# Patient Record
Sex: Female | Born: 1955
Health system: Southern US, Community
[De-identification: ages and names within clinical notes are randomized; demographics above are authoritative.]

## PROBLEM LIST (undated history)

## (undated) DIAGNOSIS — G4730 Sleep apnea, unspecified: Secondary | ICD-10-CM

## (undated) DIAGNOSIS — E049 Nontoxic goiter, unspecified: Secondary | ICD-10-CM

## (undated) DIAGNOSIS — K219 Gastro-esophageal reflux disease without esophagitis: Secondary | ICD-10-CM

## (undated) DIAGNOSIS — I2510 Atherosclerotic heart disease of native coronary artery without angina pectoris: Secondary | ICD-10-CM

## (undated) DIAGNOSIS — J449 Chronic obstructive pulmonary disease, unspecified: Secondary | ICD-10-CM

## (undated) DIAGNOSIS — IMO0001 Reserved for inherently not codable concepts without codable children: Secondary | ICD-10-CM

## (undated) DIAGNOSIS — Z87442 Personal history of urinary calculi: Secondary | ICD-10-CM

## (undated) DIAGNOSIS — R011 Cardiac murmur, unspecified: Secondary | ICD-10-CM

## (undated) DIAGNOSIS — K635 Polyp of colon: Secondary | ICD-10-CM

## (undated) DIAGNOSIS — R519 Headache, unspecified: Secondary | ICD-10-CM

## (undated) DIAGNOSIS — I5022 Chronic systolic (congestive) heart failure: Secondary | ICD-10-CM

## (undated) DIAGNOSIS — M1990 Unspecified osteoarthritis, unspecified site: Secondary | ICD-10-CM

## (undated) DIAGNOSIS — R569 Unspecified convulsions: Secondary | ICD-10-CM

## (undated) DIAGNOSIS — Z8719 Personal history of other diseases of the digestive system: Secondary | ICD-10-CM

## (undated) DIAGNOSIS — Z720 Tobacco use: Secondary | ICD-10-CM

## (undated) DIAGNOSIS — C3490 Malignant neoplasm of unspecified part of unspecified bronchus or lung: Secondary | ICD-10-CM

## (undated) DIAGNOSIS — D649 Anemia, unspecified: Secondary | ICD-10-CM

## (undated) DIAGNOSIS — F32A Depression, unspecified: Secondary | ICD-10-CM

## (undated) DIAGNOSIS — I639 Cerebral infarction, unspecified: Secondary | ICD-10-CM

## (undated) DIAGNOSIS — Z923 Personal history of irradiation: Secondary | ICD-10-CM

## (undated) DIAGNOSIS — J189 Pneumonia, unspecified organism: Secondary | ICD-10-CM

## (undated) DIAGNOSIS — I6529 Occlusion and stenosis of unspecified carotid artery: Secondary | ICD-10-CM

## (undated) DIAGNOSIS — E785 Hyperlipidemia, unspecified: Secondary | ICD-10-CM

## (undated) DIAGNOSIS — R51 Headache: Secondary | ICD-10-CM

## (undated) DIAGNOSIS — K760 Fatty (change of) liver, not elsewhere classified: Secondary | ICD-10-CM

## (undated) DIAGNOSIS — Z9889 Other specified postprocedural states: Secondary | ICD-10-CM

## (undated) DIAGNOSIS — I10 Essential (primary) hypertension: Secondary | ICD-10-CM

## (undated) DIAGNOSIS — I219 Acute myocardial infarction, unspecified: Secondary | ICD-10-CM

## (undated) DIAGNOSIS — M797 Fibromyalgia: Secondary | ICD-10-CM

## (undated) HISTORY — DX: Occlusion and stenosis of unspecified carotid artery: I65.29

## (undated) HISTORY — PX: COLON RESECTION: SHX5231

## (undated) HISTORY — DX: Cerebral infarction, unspecified: I63.9

## (undated) HISTORY — DX: Other specified postprocedural states: Z98.890

## (undated) HISTORY — DX: Atherosclerotic heart disease of native coronary artery without angina pectoris: I25.10

## (undated) HISTORY — PX: CHOLECYSTECTOMY: SHX55

## (undated) HISTORY — DX: Cardiac murmur, unspecified: R01.1

## (undated) HISTORY — DX: Chronic obstructive pulmonary disease, unspecified: J44.9

## (undated) HISTORY — PX: APPENDECTOMY: SHX54

## (undated) HISTORY — DX: Tobacco use: Z72.0

## (undated) HISTORY — DX: Polyp of colon: K63.5

## (undated) HISTORY — DX: Personal history of irradiation: Z92.3

## (undated) HISTORY — DX: Nontoxic goiter, unspecified: E04.9

## (undated) HISTORY — DX: Malignant neoplasm of unspecified part of unspecified bronchus or lung: C34.90

## (undated) HISTORY — DX: Hyperlipidemia, unspecified: E78.5

## (undated) HISTORY — DX: Personal history of other diseases of the digestive system: Z87.19

## (undated) HISTORY — DX: Essential (primary) hypertension: I10

## (undated) MED FILL — Dexamethasone Sodium Phosphate Inj 100 MG/10ML: INTRAMUSCULAR | Qty: 1 | Status: AC

## (undated) MED FILL — Fosaprepitant Dimeglumine For IV Infusion 150 MG (Base Eq): INTRAVENOUS | Qty: 5 | Status: AC

---

## 1988-10-01 HISTORY — PX: CERVICAL FUSION: SHX112

## 1991-10-02 DIAGNOSIS — I639 Cerebral infarction, unspecified: Secondary | ICD-10-CM

## 1991-10-02 HISTORY — DX: Cerebral infarction, unspecified: I63.9

## 1997-12-02 ENCOUNTER — Ambulatory Visit (HOSPITAL_COMMUNITY): Admission: RE | Admit: 1997-12-02 | Discharge: 1997-12-02 | Payer: Self-pay | Admitting: *Deleted

## 1998-01-06 ENCOUNTER — Encounter: Admission: RE | Admit: 1998-01-06 | Discharge: 1998-01-06 | Payer: Self-pay | Admitting: Family Medicine

## 1998-01-24 ENCOUNTER — Ambulatory Visit (HOSPITAL_COMMUNITY): Admission: RE | Admit: 1998-01-24 | Discharge: 1998-01-24 | Payer: Self-pay

## 1998-01-28 ENCOUNTER — Encounter: Admission: RE | Admit: 1998-01-28 | Discharge: 1998-01-28 | Payer: Self-pay | Admitting: Family Medicine

## 1998-01-31 ENCOUNTER — Encounter: Admission: RE | Admit: 1998-01-31 | Discharge: 1998-01-31 | Payer: Self-pay | Admitting: Family Medicine

## 1998-04-01 ENCOUNTER — Encounter: Admission: RE | Admit: 1998-04-01 | Discharge: 1998-04-01 | Payer: Self-pay | Admitting: Family Medicine

## 1998-09-05 ENCOUNTER — Encounter: Admission: RE | Admit: 1998-09-05 | Discharge: 1998-09-05 | Payer: Self-pay | Admitting: Sports Medicine

## 1998-09-28 ENCOUNTER — Encounter: Admission: RE | Admit: 1998-09-28 | Discharge: 1998-09-28 | Payer: Self-pay | Admitting: Sports Medicine

## 1998-10-04 ENCOUNTER — Encounter: Admission: RE | Admit: 1998-10-04 | Discharge: 1998-10-04 | Payer: Self-pay | Admitting: Family Medicine

## 1998-10-14 ENCOUNTER — Encounter: Admission: RE | Admit: 1998-10-14 | Discharge: 1998-10-14 | Payer: Self-pay | Admitting: Family Medicine

## 1998-11-04 ENCOUNTER — Encounter: Admission: RE | Admit: 1998-11-04 | Discharge: 1998-11-04 | Payer: Self-pay | Admitting: Family Medicine

## 2000-07-17 ENCOUNTER — Other Ambulatory Visit: Admission: RE | Admit: 2000-07-17 | Discharge: 2000-07-17 | Payer: Self-pay | Admitting: Family Medicine

## 2000-07-24 ENCOUNTER — Encounter: Admission: RE | Admit: 2000-07-24 | Discharge: 2000-07-24 | Payer: Self-pay | Admitting: Family Medicine

## 2000-07-24 ENCOUNTER — Encounter: Payer: Self-pay | Admitting: Family Medicine

## 2000-07-31 ENCOUNTER — Encounter: Admission: RE | Admit: 2000-07-31 | Discharge: 2000-10-29 | Payer: Self-pay | Admitting: Family Medicine

## 2002-09-01 ENCOUNTER — Encounter: Admission: RE | Admit: 2002-09-01 | Discharge: 2002-09-01 | Payer: Self-pay | Admitting: Family Medicine

## 2002-09-01 ENCOUNTER — Encounter: Payer: Self-pay | Admitting: Family Medicine

## 2002-10-12 ENCOUNTER — Encounter: Payer: Self-pay | Admitting: Family Medicine

## 2002-10-12 ENCOUNTER — Encounter: Admission: RE | Admit: 2002-10-12 | Discharge: 2002-10-12 | Payer: Self-pay | Admitting: Family Medicine

## 2002-11-09 ENCOUNTER — Other Ambulatory Visit: Admission: RE | Admit: 2002-11-09 | Discharge: 2002-11-09 | Payer: Self-pay | Admitting: Family Medicine

## 2004-06-06 ENCOUNTER — Emergency Department (HOSPITAL_COMMUNITY): Admission: EM | Admit: 2004-06-06 | Discharge: 2004-06-06 | Payer: Self-pay | Admitting: Family Medicine

## 2004-08-29 ENCOUNTER — Encounter: Admission: RE | Admit: 2004-08-29 | Discharge: 2004-08-29 | Payer: Self-pay | Admitting: Family Medicine

## 2004-10-12 ENCOUNTER — Ambulatory Visit: Payer: Self-pay | Admitting: Family Medicine

## 2004-10-18 ENCOUNTER — Ambulatory Visit: Payer: Self-pay | Admitting: Family Medicine

## 2004-10-20 ENCOUNTER — Encounter: Admission: RE | Admit: 2004-10-20 | Discharge: 2004-11-17 | Payer: Self-pay | Admitting: Chiropractic Medicine

## 2004-11-01 ENCOUNTER — Ambulatory Visit: Payer: Self-pay | Admitting: Family Medicine

## 2004-11-06 ENCOUNTER — Other Ambulatory Visit: Admission: RE | Admit: 2004-11-06 | Discharge: 2004-11-06 | Payer: Self-pay | Admitting: Obstetrics and Gynecology

## 2004-11-07 ENCOUNTER — Ambulatory Visit (HOSPITAL_COMMUNITY): Admission: RE | Admit: 2004-11-07 | Discharge: 2004-11-07 | Payer: Self-pay | Admitting: Obstetrics and Gynecology

## 2004-12-04 ENCOUNTER — Ambulatory Visit: Payer: Self-pay | Admitting: Family Medicine

## 2005-01-08 ENCOUNTER — Ambulatory Visit: Payer: Self-pay | Admitting: Family Medicine

## 2005-02-15 ENCOUNTER — Ambulatory Visit: Payer: Self-pay | Admitting: Family Medicine

## 2005-03-19 ENCOUNTER — Ambulatory Visit: Payer: Self-pay | Admitting: Family Medicine

## 2005-05-30 ENCOUNTER — Encounter: Admission: RE | Admit: 2005-05-30 | Discharge: 2005-05-30 | Payer: Self-pay | Admitting: Sports Medicine

## 2005-06-19 ENCOUNTER — Ambulatory Visit: Payer: Self-pay | Admitting: Family Medicine

## 2005-07-10 ENCOUNTER — Ambulatory Visit: Payer: Self-pay | Admitting: Family Medicine

## 2005-08-14 ENCOUNTER — Ambulatory Visit: Payer: Self-pay | Admitting: Family Medicine

## 2005-08-17 ENCOUNTER — Ambulatory Visit: Payer: Self-pay | Admitting: Family Medicine

## 2005-09-14 ENCOUNTER — Encounter: Admission: RE | Admit: 2005-09-14 | Discharge: 2005-09-14 | Payer: Self-pay | Admitting: Sports Medicine

## 2005-09-19 ENCOUNTER — Ambulatory Visit: Payer: Self-pay | Admitting: Family Medicine

## 2005-09-28 ENCOUNTER — Encounter: Admission: RE | Admit: 2005-09-28 | Discharge: 2005-09-28 | Payer: Self-pay | Admitting: Sports Medicine

## 2005-10-10 ENCOUNTER — Encounter: Admission: RE | Admit: 2005-10-10 | Discharge: 2005-10-10 | Payer: Self-pay | Admitting: Family Medicine

## 2005-11-01 ENCOUNTER — Encounter: Payer: Self-pay | Admitting: Family Medicine

## 2005-11-01 LAB — CONVERTED CEMR LAB

## 2005-11-12 ENCOUNTER — Other Ambulatory Visit: Admission: RE | Admit: 2005-11-12 | Discharge: 2005-11-12 | Payer: Self-pay | Admitting: Obstetrics and Gynecology

## 2005-11-20 ENCOUNTER — Ambulatory Visit: Payer: Self-pay | Admitting: Family Medicine

## 2005-11-26 ENCOUNTER — Encounter: Admission: RE | Admit: 2005-11-26 | Discharge: 2005-11-26 | Payer: Self-pay | Admitting: Obstetrics and Gynecology

## 2005-11-27 ENCOUNTER — Ambulatory Visit: Payer: Self-pay | Admitting: Family Medicine

## 2005-11-29 ENCOUNTER — Inpatient Hospital Stay (HOSPITAL_COMMUNITY): Admission: EM | Admit: 2005-11-29 | Discharge: 2005-12-02 | Payer: Self-pay | Admitting: Emergency Medicine

## 2005-11-29 HISTORY — PX: CARDIAC CATHETERIZATION: SHX172

## 2005-11-30 ENCOUNTER — Encounter (INDEPENDENT_AMBULATORY_CARE_PROVIDER_SITE_OTHER): Payer: Self-pay | Admitting: *Deleted

## 2005-12-03 ENCOUNTER — Inpatient Hospital Stay (HOSPITAL_COMMUNITY): Admission: EM | Admit: 2005-12-03 | Discharge: 2005-12-11 | Payer: Self-pay | Admitting: Emergency Medicine

## 2005-12-04 HISTORY — PX: CORONARY ARTERY BYPASS GRAFT: SHX141

## 2006-01-15 ENCOUNTER — Ambulatory Visit: Payer: Self-pay | Admitting: Family Medicine

## 2006-02-15 ENCOUNTER — Ambulatory Visit: Payer: Self-pay | Admitting: Family Medicine

## 2006-03-05 ENCOUNTER — Ambulatory Visit: Payer: Self-pay | Admitting: Pulmonary Disease

## 2006-03-24 ENCOUNTER — Ambulatory Visit (HOSPITAL_BASED_OUTPATIENT_CLINIC_OR_DEPARTMENT_OTHER): Admission: RE | Admit: 2006-03-24 | Discharge: 2006-03-24 | Payer: Self-pay | Admitting: Pulmonary Disease

## 2006-04-10 ENCOUNTER — Ambulatory Visit: Payer: Self-pay | Admitting: Pulmonary Disease

## 2006-04-18 ENCOUNTER — Encounter (HOSPITAL_COMMUNITY): Admission: RE | Admit: 2006-04-18 | Discharge: 2006-07-17 | Payer: Self-pay | Admitting: *Deleted

## 2006-04-29 ENCOUNTER — Encounter: Payer: Self-pay | Admitting: Pulmonary Disease

## 2006-04-29 ENCOUNTER — Ambulatory Visit: Payer: Self-pay | Admitting: Pulmonary Disease

## 2006-07-18 ENCOUNTER — Encounter (HOSPITAL_COMMUNITY): Admission: RE | Admit: 2006-07-18 | Discharge: 2006-07-22 | Payer: Self-pay | Admitting: *Deleted

## 2006-08-06 HISTORY — PX: CARDIAC CATHETERIZATION: SHX172

## 2006-08-07 ENCOUNTER — Inpatient Hospital Stay (HOSPITAL_COMMUNITY): Admission: RE | Admit: 2006-08-07 | Discharge: 2006-08-09 | Payer: Self-pay | Admitting: *Deleted

## 2006-08-11 HISTORY — PX: CORONARY STENT PLACEMENT: SHX1402

## 2006-10-10 ENCOUNTER — Ambulatory Visit: Payer: Self-pay | Admitting: Family Medicine

## 2006-10-10 LAB — CONVERTED CEMR LAB
ALT: 52 units/L — ABNORMAL HIGH (ref 0–40)
AST: 42 units/L — ABNORMAL HIGH (ref 0–37)
Albumin: 3.5 g/dL (ref 3.5–5.2)
BUN: 13 mg/dL (ref 6–23)
CO2: 30 meq/L (ref 19–32)
Calcium: 9.5 mg/dL (ref 8.4–10.5)
Chloride: 97 meq/L (ref 96–112)
Chol/HDL Ratio, serum: 4.8
Cholesterol: 169 mg/dL (ref 0–200)
Creatinine, Ser: 0.8 mg/dL (ref 0.4–1.2)
GFR calc non Af Amer: 80 mL/min
Glomerular Filtration Rate, Af Am: 97 mL/min/{1.73_m2}
Glucose, Bld: 251 mg/dL — ABNORMAL HIGH (ref 70–99)
HDL: 35 mg/dL — ABNORMAL LOW (ref >39.0)
Hgb A1c MFr Bld: 9.3 % — ABNORMAL HIGH (ref 4.6–6.0)
LDL Cholesterol: 98 mg/dL (ref 0–99)
Phosphorus Concentration: 3.4 mg/dL (ref 2.3–4.6)
Potassium: 4 meq/L (ref 3.5–5.1)
Sodium: 135 meq/L (ref 135–145)
Triglyceride fasting, serum: 182 mg/dL — ABNORMAL HIGH (ref 0–149)
VLDL: 36 mg/dL (ref 0–40)

## 2006-11-21 ENCOUNTER — Ambulatory Visit: Payer: Self-pay | Admitting: Family Medicine

## 2006-12-12 ENCOUNTER — Ambulatory Visit: Payer: Self-pay | Admitting: Pulmonary Disease

## 2007-01-02 ENCOUNTER — Ambulatory Visit: Payer: Self-pay | Admitting: Family Medicine

## 2007-01-02 LAB — CONVERTED CEMR LAB
ALT: 24 units/L (ref 0–40)
AST: 21 units/L (ref 0–37)
Albumin: 3.4 g/dL — ABNORMAL LOW (ref 3.5–5.2)
Alkaline Phosphatase: 102 units/L (ref 39–117)
Amylase: 39 units/L (ref 27–131)
BUN: 10 mg/dL (ref 6–23)
Basophils Absolute: 0.1 10*3/uL (ref 0.0–0.1)
Basophils Relative: 1.2 % — ABNORMAL HIGH (ref 0.0–1.0)
Bilirubin, Direct: 0.1 mg/dL (ref 0.0–0.3)
CO2: 30 meq/L (ref 19–32)
Calcium: 9.2 mg/dL (ref 8.4–10.5)
Chloride: 106 meq/L (ref 96–112)
Cholesterol: 129 mg/dL (ref 0–200)
Creatinine, Ser: 0.6 mg/dL (ref 0.4–1.2)
Direct LDL: 70.4 mg/dL
Eosinophils Absolute: 0.4 10*3/uL (ref 0.0–0.6)
Eosinophils Relative: 5.6 % — ABNORMAL HIGH (ref 0.0–5.0)
GFR calc Af Amer: 135 mL/min
GFR calc non Af Amer: 112 mL/min
Glucose, Bld: 190 mg/dL — ABNORMAL HIGH (ref 70–99)
HCT: 40.7 % (ref 36.0–46.0)
HDL: 34.4 mg/dL — ABNORMAL LOW (ref >39.0)
Hemoglobin: 14.2 g/dL (ref 12.0–15.0)
Hgb A1c MFr Bld: 8.1 %
Hgb A1c MFr Bld: 8.1 % — ABNORMAL HIGH (ref 4.6–6.0)
Lipase: 22 units/L (ref 11.0–59.0)
Lymphocytes Relative: 28.9 % (ref 12.0–46.0)
MCHC: 34.9 g/dL (ref 30.0–36.0)
MCV: 82.2 fL (ref 78.0–100.0)
Monocytes Absolute: 0.7 10*3/uL (ref 0.2–0.7)
Monocytes Relative: 8.6 % (ref 3.0–11.0)
Neutro Abs: 4.3 10*3/uL (ref 1.4–7.7)
Neutrophils Relative %: 55.7 % (ref 43.0–77.0)
Phosphorus: 3.2 mg/dL (ref 2.3–4.6)
Platelets: 293 10*3/uL (ref 150–400)
Potassium: 4 meq/L (ref 3.5–5.1)
RBC: 4.95 M/uL (ref 3.87–5.11)
RDW: 14.7 % — ABNORMAL HIGH (ref 11.5–14.6)
Sodium: 140 meq/L (ref 135–145)
Total Bilirubin: 0.5 mg/dL (ref 0.3–1.2)
Total CHOL/HDL Ratio: 3.7
Total Protein: 6.4 g/dL (ref 6.0–8.3)
Triglycerides: 308 mg/dL (ref 0–149)
VLDL: 62 mg/dL — ABNORMAL HIGH (ref 0–40)
WBC: 7.7 10*3/uL (ref 4.5–10.5)

## 2007-01-08 ENCOUNTER — Ambulatory Visit: Payer: Self-pay | Admitting: Internal Medicine

## 2007-01-29 ENCOUNTER — Ambulatory Visit: Payer: Self-pay | Admitting: Gastroenterology

## 2007-01-30 ENCOUNTER — Encounter (INDEPENDENT_AMBULATORY_CARE_PROVIDER_SITE_OTHER): Payer: Self-pay | Admitting: Specialist

## 2007-01-30 ENCOUNTER — Ambulatory Visit: Payer: Self-pay | Admitting: Gastroenterology

## 2007-02-05 ENCOUNTER — Encounter: Payer: Self-pay | Admitting: Family Medicine

## 2007-02-06 ENCOUNTER — Ambulatory Visit (HOSPITAL_COMMUNITY): Admission: RE | Admit: 2007-02-06 | Discharge: 2007-02-06 | Payer: Self-pay | Admitting: Gastroenterology

## 2007-02-13 ENCOUNTER — Encounter: Payer: Self-pay | Admitting: Family Medicine

## 2007-03-12 ENCOUNTER — Ambulatory Visit: Payer: Self-pay | Admitting: Gastroenterology

## 2007-03-26 ENCOUNTER — Encounter: Payer: Self-pay | Admitting: Family Medicine

## 2007-03-26 DIAGNOSIS — E1169 Type 2 diabetes mellitus with other specified complication: Secondary | ICD-10-CM | POA: Insufficient documentation

## 2007-03-26 DIAGNOSIS — F411 Generalized anxiety disorder: Secondary | ICD-10-CM | POA: Insufficient documentation

## 2007-03-26 DIAGNOSIS — I10 Essential (primary) hypertension: Secondary | ICD-10-CM | POA: Insufficient documentation

## 2007-03-26 DIAGNOSIS — G43909 Migraine, unspecified, not intractable, without status migrainosus: Secondary | ICD-10-CM | POA: Insufficient documentation

## 2007-03-26 DIAGNOSIS — Z951 Presence of aortocoronary bypass graft: Secondary | ICD-10-CM | POA: Insufficient documentation

## 2007-03-26 DIAGNOSIS — E785 Hyperlipidemia, unspecified: Secondary | ICD-10-CM | POA: Insufficient documentation

## 2007-03-26 DIAGNOSIS — F329 Major depressive disorder, single episode, unspecified: Secondary | ICD-10-CM | POA: Insufficient documentation

## 2007-03-26 DIAGNOSIS — J309 Allergic rhinitis, unspecified: Secondary | ICD-10-CM | POA: Insufficient documentation

## 2007-03-26 DIAGNOSIS — K219 Gastro-esophageal reflux disease without esophagitis: Secondary | ICD-10-CM | POA: Insufficient documentation

## 2007-03-26 DIAGNOSIS — G5600 Carpal tunnel syndrome, unspecified upper limb: Secondary | ICD-10-CM | POA: Insufficient documentation

## 2007-03-26 DIAGNOSIS — I739 Peripheral vascular disease, unspecified: Secondary | ICD-10-CM | POA: Insufficient documentation

## 2007-04-24 ENCOUNTER — Encounter: Payer: Self-pay | Admitting: Family Medicine

## 2007-08-08 ENCOUNTER — Ambulatory Visit: Payer: Self-pay | Admitting: Family Medicine

## 2007-08-08 DIAGNOSIS — F17200 Nicotine dependence, unspecified, uncomplicated: Secondary | ICD-10-CM | POA: Insufficient documentation

## 2007-08-09 ENCOUNTER — Encounter: Payer: Self-pay | Admitting: Family Medicine

## 2007-08-13 LAB — CONVERTED CEMR LAB
ALT: 33 units/L (ref 0–35)
AST: 24 units/L (ref 0–37)
Albumin: 4.3 g/dL (ref 3.5–5.2)
BUN: 12 mg/dL (ref 6–23)
CO2: 23 meq/L (ref 19–32)
Calcium: 9.4 mg/dL (ref 8.4–10.5)
Chloride: 106 meq/L (ref 96–112)
Cholesterol: 142 mg/dL (ref 0–200)
Creatinine, Ser: 0.78 mg/dL (ref 0.40–1.20)
Glucose, Bld: 59 mg/dL — ABNORMAL LOW (ref 70–99)
HDL: 37 mg/dL — ABNORMAL LOW (ref >39)
LDL Cholesterol: 65 mg/dL (ref 0–99)
Phosphorus: 3 mg/dL (ref 2.3–4.6)
Potassium: 4.3 meq/L (ref 3.5–5.3)
Sodium: 140 meq/L (ref 135–145)
TSH: 1.973 microintl units/mL (ref 0.350–5.50)
Total CHOL/HDL Ratio: 3.8
Triglycerides: 198 mg/dL — ABNORMAL HIGH (ref <150)
VLDL: 40 mg/dL (ref 0–40)

## 2007-08-22 ENCOUNTER — Encounter: Payer: Self-pay | Admitting: Family Medicine

## 2007-09-18 DIAGNOSIS — G4733 Obstructive sleep apnea (adult) (pediatric): Secondary | ICD-10-CM | POA: Insufficient documentation

## 2007-10-08 ENCOUNTER — Encounter: Payer: Self-pay | Admitting: Family Medicine

## 2007-12-25 ENCOUNTER — Encounter: Payer: Self-pay | Admitting: Family Medicine

## 2008-01-15 ENCOUNTER — Encounter: Payer: Self-pay | Admitting: Gastroenterology

## 2008-01-15 ENCOUNTER — Ambulatory Visit: Payer: Self-pay | Admitting: Gastroenterology

## 2008-01-15 LAB — CONVERTED CEMR LAB
ALT: 36 units/L — ABNORMAL HIGH (ref 0–35)
AST: 35 units/L (ref 0–37)
Albumin: 3.5 g/dL (ref 3.5–5.2)
Alkaline Phosphatase: 78 units/L (ref 39–117)
BUN: 10 mg/dL (ref 6–23)
Basophils Absolute: 0 10*3/uL (ref 0.0–0.1)
Basophils Relative: 0.3 % (ref 0.0–1.0)
CO2: 29 meq/L (ref 19–32)
Calcium: 9.2 mg/dL (ref 8.4–10.5)
Chloride: 103 meq/L (ref 96–112)
Creatinine, Ser: 0.8 mg/dL (ref 0.4–1.2)
Eosinophils Absolute: 0.2 10*3/uL (ref 0.0–0.7)
Eosinophils Relative: 2.7 % (ref 0.0–5.0)
GFR calc Af Amer: 96 mL/min
GFR calc non Af Amer: 80 mL/min
Glucose, Bld: 176 mg/dL — ABNORMAL HIGH (ref 70–99)
HCT: 41.7 % (ref 36.0–46.0)
Hemoglobin: 14.1 g/dL (ref 12.0–15.0)
Lipase: 34 units/L (ref 11.0–59.0)
Lymphocytes Relative: 26.9 % (ref 12.0–46.0)
MCHC: 33.9 g/dL (ref 30.0–36.0)
MCV: 84.5 fL (ref 78.0–100.0)
Monocytes Absolute: 0.4 10*3/uL (ref 0.1–1.0)
Monocytes Relative: 5.8 % (ref 3.0–12.0)
Neutro Abs: 4.8 10*3/uL (ref 1.4–7.7)
Neutrophils Relative %: 64.3 % (ref 43.0–77.0)
Platelets: 286 10*3/uL (ref 150–400)
Potassium: 3.6 meq/L (ref 3.5–5.1)
RBC: 4.93 M/uL (ref 3.87–5.11)
RDW: 12.7 % (ref 11.5–14.6)
Sodium: 137 meq/L (ref 135–145)
Total Bilirubin: 0.6 mg/dL (ref 0.3–1.2)
Total Protein: 6.6 g/dL (ref 6.0–8.3)
WBC: 7.4 10*3/uL (ref 4.5–10.5)

## 2008-01-21 ENCOUNTER — Telehealth: Payer: Self-pay | Admitting: Family Medicine

## 2008-01-27 ENCOUNTER — Telehealth (INDEPENDENT_AMBULATORY_CARE_PROVIDER_SITE_OTHER): Payer: Self-pay | Admitting: *Deleted

## 2008-02-06 ENCOUNTER — Encounter: Payer: Self-pay | Admitting: Family Medicine

## 2008-03-10 ENCOUNTER — Ambulatory Visit: Payer: Self-pay | Admitting: Gastroenterology

## 2008-03-17 ENCOUNTER — Encounter: Payer: Self-pay | Admitting: Gastroenterology

## 2008-03-17 ENCOUNTER — Ambulatory Visit: Payer: Self-pay | Admitting: Gastroenterology

## 2008-03-18 ENCOUNTER — Encounter: Payer: Self-pay | Admitting: Gastroenterology

## 2008-04-21 ENCOUNTER — Ambulatory Visit: Payer: Self-pay | Admitting: Family Medicine

## 2008-04-26 LAB — CONVERTED CEMR LAB
ALT: 51 units/L — ABNORMAL HIGH (ref 0–35)
AST: 33 units/L (ref 0–37)
Albumin: 3.7 g/dL (ref 3.5–5.2)
Alkaline Phosphatase: 87 units/L (ref 39–117)
Bilirubin, Direct: 0.1 mg/dL (ref 0.0–0.3)
Cholesterol: 170 mg/dL (ref 0–200)
Direct LDL: 116.4 mg/dL
HDL: 33.2 mg/dL — ABNORMAL LOW (ref >39.0)
Total Bilirubin: 0.7 mg/dL (ref 0.3–1.2)
Total CHOL/HDL Ratio: 5.1
Total Protein: 7 g/dL (ref 6.0–8.3)
Triglycerides: 211 mg/dL (ref 0–149)
VLDL: 42 mg/dL — ABNORMAL HIGH (ref 0–40)

## 2008-05-19 ENCOUNTER — Encounter: Admission: RE | Admit: 2008-05-19 | Discharge: 2008-07-07 | Payer: Self-pay | Admitting: Sports Medicine

## 2008-05-27 ENCOUNTER — Ambulatory Visit: Payer: Self-pay | Admitting: Family Medicine

## 2008-05-28 LAB — CONVERTED CEMR LAB
ALT: 45 units/L — ABNORMAL HIGH (ref 0–35)
AST: 27 units/L (ref 0–37)
Cholesterol: 151 mg/dL (ref 0–200)
HDL: 34.6 mg/dL — ABNORMAL LOW (ref >39.0)
LDL Cholesterol: 77 mg/dL (ref 0–99)
Total CHOL/HDL Ratio: 4.4
Triglycerides: 197 mg/dL — ABNORMAL HIGH (ref 0–149)
VLDL: 39 mg/dL (ref 0–40)

## 2008-06-14 ENCOUNTER — Telehealth (INDEPENDENT_AMBULATORY_CARE_PROVIDER_SITE_OTHER): Payer: Self-pay | Admitting: *Deleted

## 2008-06-21 ENCOUNTER — Telehealth (INDEPENDENT_AMBULATORY_CARE_PROVIDER_SITE_OTHER): Payer: Self-pay | Admitting: *Deleted

## 2008-07-20 ENCOUNTER — Encounter: Admission: RE | Admit: 2008-07-20 | Discharge: 2008-07-20 | Payer: Self-pay | Admitting: Orthopedic Surgery

## 2008-08-13 ENCOUNTER — Telehealth: Payer: Self-pay | Admitting: Family Medicine

## 2008-08-24 ENCOUNTER — Telehealth (INDEPENDENT_AMBULATORY_CARE_PROVIDER_SITE_OTHER): Payer: Self-pay | Admitting: *Deleted

## 2008-09-27 ENCOUNTER — Encounter: Payer: Self-pay | Admitting: Family Medicine

## 2008-10-28 ENCOUNTER — Encounter: Payer: Self-pay | Admitting: Family Medicine

## 2008-11-04 ENCOUNTER — Ambulatory Visit: Payer: Self-pay | Admitting: Family Medicine

## 2008-11-08 LAB — CONVERTED CEMR LAB
ALT: 32 units/L (ref 0–35)
AST: 33 units/L (ref 0–37)
Albumin: 3.6 g/dL (ref 3.5–5.2)
BUN: 11 mg/dL (ref 6–23)
CO2: 0 meq/L — CL (ref 19–32)
Calcium: 9.3 mg/dL (ref 8.4–10.5)
Chloride: 107 meq/L (ref 96–112)
Cholesterol: 124 mg/dL (ref 0–200)
Creatinine, Ser: 0.7 mg/dL (ref 0.4–1.2)
GFR calc Af Amer: 112 mL/min
GFR calc non Af Amer: 93 mL/min
Glucose, Bld: 78 mg/dL (ref 70–99)
HDL: 38.3 mg/dL — ABNORMAL LOW (ref >39.0)
LDL Cholesterol: 60 mg/dL (ref 0–99)
Phosphorus: 2.8 mg/dL (ref 2.3–4.6)
Potassium: 4.8 meq/L (ref 3.5–5.1)
Sodium: 139 meq/L (ref 135–145)
Total CHOL/HDL Ratio: 3.2
Triglycerides: 127 mg/dL (ref 0–149)
VLDL: 25 mg/dL (ref 0–40)

## 2008-11-22 ENCOUNTER — Ambulatory Visit: Payer: Self-pay | Admitting: Internal Medicine

## 2008-11-24 ENCOUNTER — Encounter: Payer: Self-pay | Admitting: Family Medicine

## 2008-12-06 ENCOUNTER — Telehealth (INDEPENDENT_AMBULATORY_CARE_PROVIDER_SITE_OTHER): Payer: Self-pay | Admitting: *Deleted

## 2008-12-14 ENCOUNTER — Ambulatory Visit: Payer: Self-pay | Admitting: Family Medicine

## 2009-01-03 ENCOUNTER — Encounter (HOSPITAL_COMMUNITY): Admission: RE | Admit: 2009-01-03 | Discharge: 2009-04-03 | Payer: Self-pay | Admitting: *Deleted

## 2009-02-02 ENCOUNTER — Telehealth: Payer: Self-pay | Admitting: Family Medicine

## 2009-02-09 ENCOUNTER — Encounter: Admission: RE | Admit: 2009-02-09 | Discharge: 2009-02-09 | Payer: Self-pay | Admitting: Endocrinology

## 2009-02-22 ENCOUNTER — Encounter: Payer: Self-pay | Admitting: Family Medicine

## 2009-03-22 ENCOUNTER — Encounter: Payer: Self-pay | Admitting: Family Medicine

## 2009-04-15 ENCOUNTER — Telehealth: Payer: Self-pay | Admitting: Family Medicine

## 2009-04-26 ENCOUNTER — Telehealth: Payer: Self-pay | Admitting: Family Medicine

## 2009-05-17 ENCOUNTER — Telehealth: Payer: Self-pay | Admitting: Gastroenterology

## 2009-05-18 ENCOUNTER — Ambulatory Visit: Payer: Self-pay | Admitting: Family Medicine

## 2009-05-23 LAB — CONVERTED CEMR LAB
ALT: 36 units/L — ABNORMAL HIGH (ref 0–35)
AST: 26 units/L (ref 0–37)
Albumin: 3.6 g/dL (ref 3.5–5.2)
Alkaline Phosphatase: 77 units/L (ref 39–117)
BUN: 7 mg/dL (ref 6–23)
Basophils Absolute: 0 10*3/uL (ref 0.0–0.1)
Basophils Relative: 0.4 % (ref 0.0–3.0)
Bilirubin, Direct: 0 mg/dL (ref 0.0–0.3)
CO2: 29 meq/L (ref 19–32)
Calcium: 8.7 mg/dL (ref 8.4–10.5)
Chloride: 101 meq/L (ref 96–112)
Cholesterol: 113 mg/dL (ref 0–200)
Creatinine, Ser: 0.8 mg/dL (ref 0.4–1.2)
Eosinophils Absolute: 0.1 10*3/uL (ref 0.0–0.7)
Eosinophils Relative: 0.8 % (ref 0.0–5.0)
Glucose, Bld: 93 mg/dL (ref 70–99)
HCT: 42.7 % (ref 36.0–46.0)
HDL: 37.7 mg/dL — ABNORMAL LOW (ref >39.00)
Hemoglobin: 14.4 g/dL (ref 12.0–15.0)
LDL Cholesterol: 46 mg/dL (ref 0–99)
Lymphocytes Relative: 29.4 % (ref 12.0–46.0)
Lymphs Abs: 3.1 10*3/uL (ref 0.7–4.0)
MCHC: 33.6 g/dL (ref 30.0–36.0)
MCV: 86 fL (ref 78.0–100.0)
Monocytes Absolute: 0.9 10*3/uL (ref 0.1–1.0)
Monocytes Relative: 8.8 % (ref 3.0–12.0)
Neutro Abs: 6.5 10*3/uL (ref 1.4–7.7)
Neutrophils Relative %: 60.6 % (ref 43.0–77.0)
Phosphorus: 3.1 mg/dL (ref 2.3–4.6)
Platelets: 275 10*3/uL (ref 150.0–400.0)
Potassium: 4.4 meq/L (ref 3.5–5.1)
RBC: 4.97 M/uL (ref 3.87–5.11)
RDW: 14 % (ref 11.5–14.6)
Sodium: 136 meq/L (ref 135–145)
Total Bilirubin: 0.6 mg/dL (ref 0.3–1.2)
Total CHOL/HDL Ratio: 3
Total Protein: 6.6 g/dL (ref 6.0–8.3)
Triglycerides: 149 mg/dL (ref 0.0–149.0)
VLDL: 29.8 mg/dL (ref 0.0–40.0)
WBC: 10.6 10*3/uL — ABNORMAL HIGH (ref 4.5–10.5)

## 2009-06-13 ENCOUNTER — Telehealth: Payer: Self-pay | Admitting: Family Medicine

## 2009-06-14 ENCOUNTER — Ambulatory Visit: Payer: Self-pay | Admitting: Gastroenterology

## 2009-06-30 ENCOUNTER — Telehealth: Payer: Self-pay | Admitting: Family Medicine

## 2009-08-15 ENCOUNTER — Telehealth: Payer: Self-pay | Admitting: Family Medicine

## 2009-09-01 ENCOUNTER — Telehealth: Payer: Self-pay | Admitting: Family Medicine

## 2009-10-14 ENCOUNTER — Ambulatory Visit: Payer: Self-pay | Admitting: Pulmonary Disease

## 2009-10-17 ENCOUNTER — Encounter: Payer: Self-pay | Admitting: Pulmonary Disease

## 2009-10-21 ENCOUNTER — Telehealth: Payer: Self-pay | Admitting: Family Medicine

## 2009-11-02 ENCOUNTER — Ambulatory Visit: Payer: Self-pay | Admitting: Pulmonary Disease

## 2009-11-02 ENCOUNTER — Encounter: Payer: Self-pay | Admitting: Pulmonary Disease

## 2009-11-03 ENCOUNTER — Ambulatory Visit: Payer: Self-pay | Admitting: Internal Medicine

## 2009-11-03 ENCOUNTER — Encounter: Payer: Self-pay | Admitting: Pulmonary Disease

## 2009-11-07 ENCOUNTER — Telehealth: Payer: Self-pay | Admitting: Pulmonary Disease

## 2009-11-08 ENCOUNTER — Encounter: Payer: Self-pay | Admitting: Pulmonary Disease

## 2009-11-08 LAB — CONVERTED CEMR LAB
BUN: 7 mg/dL (ref 6–23)
CO2: 31 meq/L (ref 19–32)
Calcium: 9.4 mg/dL (ref 8.4–10.5)
Chloride: 102 meq/L (ref 96–112)
Creatinine, Ser: 0.9 mg/dL (ref 0.4–1.2)
GFR calc non Af Amer: 68.93 mL/min (ref >60)
Glucose, Bld: 203 mg/dL — ABNORMAL HIGH (ref 70–99)
Potassium: 4.1 meq/L (ref 3.5–5.1)
Sodium: 138 meq/L (ref 135–145)

## 2009-11-11 ENCOUNTER — Encounter: Payer: Self-pay | Admitting: Pulmonary Disease

## 2009-11-15 ENCOUNTER — Ambulatory Visit: Payer: Self-pay | Admitting: Family Medicine

## 2009-11-18 LAB — CONVERTED CEMR LAB
ALT: 31 units/L (ref 0–35)
AST: 22 units/L (ref 0–37)
Albumin: 3.5 g/dL (ref 3.5–5.2)
Alkaline Phosphatase: 72 units/L (ref 39–117)
Basophils Absolute: 0.1 10*3/uL (ref 0.0–0.1)
Basophils Relative: 0.7 % (ref 0.0–3.0)
Bilirubin, Direct: 0.1 mg/dL (ref 0.0–0.3)
Cholesterol: 131 mg/dL (ref 0–200)
Eosinophils Absolute: 0.1 10*3/uL (ref 0.0–0.7)
Eosinophils Relative: 1.4 % (ref 0.0–5.0)
HCT: 41.3 % (ref 36.0–46.0)
HDL: 45.2 mg/dL (ref >39.00)
Hemoglobin: 13.9 g/dL (ref 12.0–15.0)
LDL Cholesterol: 55 mg/dL (ref 0–99)
Lymphocytes Relative: 24.6 % (ref 12.0–46.0)
Lymphs Abs: 1.8 10*3/uL (ref 0.7–4.0)
MCHC: 33.8 g/dL (ref 30.0–36.0)
MCV: 84.8 fL (ref 78.0–100.0)
Monocytes Absolute: 0.7 10*3/uL (ref 0.1–1.0)
Monocytes Relative: 9.9 % (ref 3.0–12.0)
Neutro Abs: 4.8 10*3/uL (ref 1.4–7.7)
Neutrophils Relative %: 63.4 % (ref 43.0–77.0)
Platelets: 261 10*3/uL (ref 150.0–400.0)
RBC: 4.86 M/uL (ref 3.87–5.11)
RDW: 13.7 % (ref 11.5–14.6)
Total Bilirubin: 0.3 mg/dL (ref 0.3–1.2)
Total CHOL/HDL Ratio: 3
Total Protein: 6.5 g/dL (ref 6.0–8.3)
Triglycerides: 153 mg/dL — ABNORMAL HIGH (ref 0.0–149.0)
VLDL: 30.6 mg/dL (ref 0.0–40.0)
WBC: 7.5 10*3/uL (ref 4.5–10.5)

## 2009-12-23 ENCOUNTER — Encounter: Admission: RE | Admit: 2009-12-23 | Discharge: 2009-12-23 | Payer: Self-pay | Admitting: Endocrinology

## 2010-01-03 ENCOUNTER — Telehealth: Payer: Self-pay | Admitting: Family Medicine

## 2010-01-05 ENCOUNTER — Encounter: Admission: RE | Admit: 2010-01-05 | Discharge: 2010-01-05 | Payer: Self-pay | Admitting: Family Medicine

## 2010-01-06 ENCOUNTER — Encounter (INDEPENDENT_AMBULATORY_CARE_PROVIDER_SITE_OTHER): Payer: Self-pay | Admitting: *Deleted

## 2010-01-12 ENCOUNTER — Telehealth: Payer: Self-pay | Admitting: Family Medicine

## 2010-01-24 ENCOUNTER — Telehealth: Payer: Self-pay | Admitting: Family Medicine

## 2010-01-26 ENCOUNTER — Telehealth: Payer: Self-pay | Admitting: Family Medicine

## 2010-02-17 ENCOUNTER — Ambulatory Visit: Payer: Self-pay | Admitting: Family Medicine

## 2010-02-17 LAB — CONVERTED CEMR LAB
Bilirubin Urine: NEGATIVE
Casts: 0 /lpf
Glucose, Urine, Semiquant: 500
KOH Prep: NEGATIVE
Ketones, urine, test strip: NEGATIVE
Nitrite: NEGATIVE
Protein, U semiquant: NEGATIVE
RBC / HPF: 0
Specific Gravity, Urine: 1.01
Urine crystals, microscopic: 1 /hpf
Urobilinogen, UA: 0.2
WBC Urine, dipstick: NEGATIVE
WBC, UA: 0 cells/hpf
Whiff Test: NEGATIVE
Yeast, UA: 0
pH: 6

## 2010-02-20 ENCOUNTER — Telehealth: Payer: Self-pay | Admitting: Internal Medicine

## 2010-02-20 ENCOUNTER — Telehealth: Payer: Self-pay | Admitting: Pulmonary Disease

## 2010-02-21 ENCOUNTER — Encounter: Payer: Self-pay | Admitting: Gastroenterology

## 2010-03-03 ENCOUNTER — Ambulatory Visit: Payer: Self-pay | Admitting: Pulmonary Disease

## 2010-03-10 ENCOUNTER — Telehealth: Payer: Self-pay | Admitting: Family Medicine

## 2010-04-06 ENCOUNTER — Encounter: Admission: RE | Admit: 2010-04-06 | Discharge: 2010-04-06 | Payer: Self-pay | Admitting: Sports Medicine

## 2010-05-03 ENCOUNTER — Encounter: Payer: Self-pay | Admitting: Pulmonary Disease

## 2010-05-04 LAB — CONVERTED CEMR LAB: Pap Smear: NORMAL

## 2010-05-11 ENCOUNTER — Ambulatory Visit: Payer: Self-pay | Admitting: Family Medicine

## 2010-05-12 LAB — CONVERTED CEMR LAB
ALT: 27 units/L (ref 0–35)
AST: 20 units/L (ref 0–37)
Albumin: 3.6 g/dL (ref 3.5–5.2)
Alkaline Phosphatase: 68 units/L (ref 39–117)
BUN: 9 mg/dL (ref 6–23)
Basophils Absolute: 0 10*3/uL (ref 0.0–0.1)
Basophils Relative: 0.3 % (ref 0.0–3.0)
Bilirubin, Direct: 0.1 mg/dL (ref 0.0–0.3)
CO2: 30 meq/L (ref 19–32)
Calcium: 9.5 mg/dL (ref 8.4–10.5)
Chloride: 100 meq/L (ref 96–112)
Cholesterol: 125 mg/dL (ref 0–200)
Creatinine, Ser: 0.6 mg/dL (ref 0.4–1.2)
Eosinophils Absolute: 0.1 10*3/uL (ref 0.0–0.7)
Eosinophils Relative: 0.9 % (ref 0.0–5.0)
GFR calc non Af Amer: 116.54 mL/min (ref >60)
Glucose, Bld: 117 mg/dL — ABNORMAL HIGH (ref 70–99)
HCT: 41.3 % (ref 36.0–46.0)
HDL: 42.6 mg/dL (ref >39.00)
Hemoglobin: 13.7 g/dL (ref 12.0–15.0)
LDL Cholesterol: 59 mg/dL (ref 0–99)
Lymphocytes Relative: 28.1 % (ref 12.0–46.0)
Lymphs Abs: 2.9 10*3/uL (ref 0.7–4.0)
MCHC: 33.2 g/dL (ref 30.0–36.0)
MCV: 80.4 fL (ref 78.0–100.0)
Monocytes Absolute: 0.8 10*3/uL (ref 0.1–1.0)
Monocytes Relative: 7.9 % (ref 3.0–12.0)
Neutro Abs: 6.4 10*3/uL (ref 1.4–7.7)
Neutrophils Relative %: 62.8 % (ref 43.0–77.0)
Phosphorus: 3.4 mg/dL (ref 2.3–4.6)
Platelets: 310 10*3/uL (ref 150.0–400.0)
Potassium: 4.9 meq/L (ref 3.5–5.1)
RBC: 5.14 M/uL — ABNORMAL HIGH (ref 3.87–5.11)
RDW: 17.2 % — ABNORMAL HIGH (ref 11.5–14.6)
Sodium: 140 meq/L (ref 135–145)
TSH: 1 microintl units/mL (ref 0.35–5.50)
Total Bilirubin: 0.3 mg/dL (ref 0.3–1.2)
Total CHOL/HDL Ratio: 3
Total Protein: 6.4 g/dL (ref 6.0–8.3)
Triglycerides: 118 mg/dL (ref 0.0–149.0)
VLDL: 23.6 mg/dL (ref 0.0–40.0)
WBC: 10.2 10*3/uL (ref 4.5–10.5)

## 2010-05-24 ENCOUNTER — Encounter: Payer: Self-pay | Admitting: Family Medicine

## 2010-06-15 ENCOUNTER — Telehealth (INDEPENDENT_AMBULATORY_CARE_PROVIDER_SITE_OTHER): Payer: Self-pay | Admitting: *Deleted

## 2010-06-16 ENCOUNTER — Encounter: Payer: Self-pay | Admitting: Pulmonary Disease

## 2010-06-23 ENCOUNTER — Encounter: Payer: Self-pay | Admitting: Family Medicine

## 2010-06-29 ENCOUNTER — Encounter: Payer: Self-pay | Admitting: Family Medicine

## 2010-06-29 ENCOUNTER — Ambulatory Visit: Payer: Self-pay | Admitting: Cardiology

## 2010-07-06 ENCOUNTER — Encounter: Payer: Self-pay | Admitting: Family Medicine

## 2010-07-13 ENCOUNTER — Ambulatory Visit: Payer: Self-pay | Admitting: Pulmonary Disease

## 2010-07-13 ENCOUNTER — Telehealth: Payer: Self-pay | Admitting: Gastroenterology

## 2010-07-14 ENCOUNTER — Encounter: Payer: Self-pay | Admitting: Gastroenterology

## 2010-07-21 ENCOUNTER — Encounter: Payer: Self-pay | Admitting: Family Medicine

## 2010-07-21 ENCOUNTER — Ambulatory Visit: Payer: Self-pay | Admitting: Vascular Surgery

## 2010-07-25 ENCOUNTER — Telehealth: Payer: Self-pay | Admitting: Family Medicine

## 2010-07-26 ENCOUNTER — Encounter: Payer: Self-pay | Admitting: Family Medicine

## 2010-07-26 ENCOUNTER — Ambulatory Visit: Payer: Self-pay | Admitting: Internal Medicine

## 2010-07-26 ENCOUNTER — Ambulatory Visit: Payer: Self-pay | Admitting: Family Medicine

## 2010-07-26 DIAGNOSIS — R42 Dizziness and giddiness: Secondary | ICD-10-CM | POA: Insufficient documentation

## 2010-07-26 LAB — CONVERTED CEMR LAB
ALT: 29 units/L (ref 0–35)
AST: 20 units/L (ref 0–37)
Albumin: 3.5 g/dL (ref 3.5–5.2)
Alkaline Phosphatase: 83 units/L (ref 39–117)
BUN: 9 mg/dL (ref 6–23)
Basophils Absolute: 0 10*3/uL (ref 0.0–0.1)
Basophils Relative: 0.3 % (ref 0.0–3.0)
Bilirubin, Direct: 0.1 mg/dL (ref 0.0–0.3)
CO2: 26 meq/L (ref 19–32)
Calcium: 9.1 mg/dL (ref 8.4–10.5)
Chloride: 99 meq/L (ref 96–112)
Creatinine, Ser: 0.7 mg/dL (ref 0.4–1.2)
Eosinophils Absolute: 0.1 10*3/uL (ref 0.0–0.7)
Eosinophils Relative: 1 % (ref 0.0–5.0)
GFR calc non Af Amer: 86.17 mL/min (ref >60)
Glucose, Bld: 193 mg/dL — ABNORMAL HIGH (ref 70–99)
HCT: 40 % (ref 36.0–46.0)
Hemoglobin: 13.4 g/dL (ref 12.0–15.0)
Lymphocytes Relative: 31.3 % (ref 12.0–46.0)
Lymphs Abs: 2.6 10*3/uL (ref 0.7–4.0)
MCHC: 33.6 g/dL (ref 30.0–36.0)
MCV: 79.6 fL (ref 78.0–100.0)
Monocytes Absolute: 0.8 10*3/uL (ref 0.1–1.0)
Monocytes Relative: 9.1 % (ref 3.0–12.0)
Neutro Abs: 4.9 10*3/uL (ref 1.4–7.7)
Neutrophils Relative %: 58.3 % (ref 43.0–77.0)
Platelets: 307 10*3/uL (ref 150.0–400.0)
Potassium: 4.3 meq/L (ref 3.5–5.1)
RBC: 5.02 M/uL (ref 3.87–5.11)
RDW: 17 % — ABNORMAL HIGH (ref 11.5–14.6)
Sodium: 135 meq/L (ref 135–145)
TSH: 1.46 microintl units/mL (ref 0.35–5.50)
Total Bilirubin: 0.4 mg/dL (ref 0.3–1.2)
Total Protein: 6.4 g/dL (ref 6.0–8.3)
WBC: 8.4 10*3/uL (ref 4.5–10.5)

## 2010-08-02 ENCOUNTER — Ambulatory Visit: Payer: Self-pay | Admitting: Family Medicine

## 2010-08-02 DIAGNOSIS — H6120 Impacted cerumen, unspecified ear: Secondary | ICD-10-CM | POA: Insufficient documentation

## 2010-08-08 ENCOUNTER — Encounter: Payer: Self-pay | Admitting: Pulmonary Disease

## 2010-08-25 ENCOUNTER — Encounter: Admission: RE | Admit: 2010-08-25 | Discharge: 2010-08-25 | Payer: Self-pay | Admitting: Sports Medicine

## 2010-09-14 ENCOUNTER — Ambulatory Visit: Payer: Self-pay | Admitting: Gastroenterology

## 2010-09-14 DIAGNOSIS — R1319 Other dysphagia: Secondary | ICD-10-CM | POA: Insufficient documentation

## 2010-09-20 ENCOUNTER — Telehealth: Payer: Self-pay | Admitting: Pulmonary Disease

## 2010-09-20 ENCOUNTER — Encounter: Payer: Self-pay | Admitting: Gastroenterology

## 2010-09-20 ENCOUNTER — Ambulatory Visit (HOSPITAL_COMMUNITY)
Admission: RE | Admit: 2010-09-20 | Discharge: 2010-09-20 | Payer: Self-pay | Source: Home / Self Care | Attending: Gastroenterology | Admitting: Gastroenterology

## 2010-10-16 ENCOUNTER — Telehealth: Payer: Self-pay | Admitting: Family Medicine

## 2010-10-20 ENCOUNTER — Ambulatory Visit
Admission: RE | Admit: 2010-10-20 | Discharge: 2010-10-20 | Payer: Self-pay | Source: Home / Self Care | Attending: Gastroenterology | Admitting: Gastroenterology

## 2010-10-20 ENCOUNTER — Encounter: Payer: Self-pay | Admitting: Gastroenterology

## 2010-10-21 ENCOUNTER — Other Ambulatory Visit: Payer: Self-pay | Admitting: Endocrinology

## 2010-10-21 ENCOUNTER — Encounter: Payer: Self-pay | Admitting: Family Medicine

## 2010-10-21 DIAGNOSIS — E041 Nontoxic single thyroid nodule: Secondary | ICD-10-CM

## 2010-10-23 LAB — GLUCOSE, CAPILLARY
Glucose-Capillary: 165 mg/dL — ABNORMAL HIGH (ref 70–99)
Glucose-Capillary: 135 mg/dL — ABNORMAL HIGH (ref 70–99)

## 2010-10-25 ENCOUNTER — Encounter: Payer: Self-pay | Admitting: Family Medicine

## 2010-10-30 ENCOUNTER — Other Ambulatory Visit: Payer: Self-pay | Admitting: Neurology

## 2010-10-30 DIAGNOSIS — I6990 Unspecified sequelae of unspecified cerebrovascular disease: Secondary | ICD-10-CM

## 2010-10-31 NOTE — Letter (Signed)
Summary: [facility] / PROGRESS NOTE / DR. BINDUBAL [facility] / PROGRESS NOTE / DR. BINDUBAL BALAN   Imported By: Carin Primrose 12/10/2008 08:52:54  _____________________________________________________________________  External Attachment:    Type:   Image     Comment:   External Document

## 2010-10-31 NOTE — Miscellaneous (Signed)
Summary: GI Previsit  Clinical Lists Changes  Medications: Added new medication of MOVIPREP 100 GM  SOLR (PEG-KCL-NACL-NASULF-NA ASC-C) As per prep instructions. - Signed Rx of MOVIPREP 100 GM  SOLR (PEG-KCL-NACL-NASULF-NA ASC-C) As per prep instructions.;  #1 x 0;  Signed;  Entered by: Barton Fanny RN;  Authorized by: Meryl Dare MD [facility];  Method used: Electronic Observations: Added new observation of ALLERGY REV: Done (03/10/2008 15:34)    Prescriptions: MOVIPREP 100 GM  SOLR (PEG-KCL-NACL-NASULF-NA ASC-C) As per prep instructions.  #1 x 0   Entered by:   Barton Fanny RN   Authorized by:   Meryl Dare MD [facility]   Signed by:   Barton Fanny RN on 03/10/2008   Method used:   Electronically sent to ...       [facility] [address] [phone]*       [address]       [place], [place]  [address]       Ph: [phone] or [phone]       Fax: [phone]   RxID:   [id number]

## 2010-10-31 NOTE — Letter (Signed)
Summary: Appt Reminder 2  [facility]  [address] [place], [place] [address]   Phone: [phone]  Fax: [phone]        July 14, 2010 MRN: [id number]    [facility] [address] [place], [place]  [address]    Dear Ms. Sawin,   You have a return appointment with Dr. Russella Dar on 09/14/10 at 9:45 am.  There are some appointments earlier in December and late November, but not on a Thursday or a Friday.  If you would like an earlier appointment Please call back to reschedule.  Please remember to bring a complete list of the medicines you are taking, your insurance card and your co-pay.  If you have to cancel or reschedule this appointment, please call before 5:00 pm the evening before to avoid a cancellation fee.  If you have any questions or concerns, please call [phone].    Sincerely,    Darcey Nora RN, CGRN

## 2010-10-31 NOTE — Miscellaneous (Signed)
Summary: CPAP/[facility]  CPAP/[facility]   Imported By: [place] 10/25/2009 07:27:24  _____________________________________________________________________  External Attachment:    Type:   Image     Comment:   External Document

## 2010-10-31 NOTE — Assessment & Plan Note (Signed)
Summary: F/U AND LAB WORK/DLO   Vital Signs:  Patient Profile:   55 Years Old Female Height:     58 inches Weight:      168 pounds BMI:     35.24 Temp:     97.6 degrees F oral Pulse rate:   76 / minute Pulse rhythm:   regular BP sitting:   140 / 76  (left arm) Cuff size:   regular  Vitals Entered By: Liane Comber (November 04, 2008 10:30 AM)                 PCP:  [name], MD  Chief Complaint:  f/u.  History of Present Illness: has been feeling ok overall  saw cardiol last week- for continued chest pain-- planned to start back with cardiac rehab  is fair with her diet - bad over the holidays  sees Dr Talmage Nap for DM  sugars are really imp on current meds - due for [facility] and visit soon  last AIC was 6.1  bp is overall stable  no headaches or palpitations  was briefly on lexapro for "stress"- and it did not help-- given to her by cardiologist  is stressed and taking care of father with alz (hard to relax)  refuses to return to psychiatry or take any more mood stabilizers  is on "renexa?" for chest pain   11/2 ppd smoker wants to quit considering a group therapy group breathing is not changed - stopped advair because it was not helping  needs f/u with pulm  has had a series of cortisone shots - for hands/ backr   had to stop diovan - due to insurance problem- will be starting another med soon - from cardiology-- ? ARB-- cannot remember which one - possibly benicar      Serial Vital Signs/Assessments:  Time      Position  BP       Pulse  Resp  Temp     By                     139/80                         Judith Part MD    Current Allergies (reviewed today): TETRACYCLINE ACE INHIBITORS * EGGS  Past Medical History:    Reviewed history from 10/24/2008 and no changes required:       Allergic rhinitis       Anxiety       tabacco abuse        COPD / emphysema       Coronary artery disease- CABG       cva        Depression       Diabetes  mellitus, type II       GERD / esophagitis       Hyperlipidemia       Hypertension       Obstuctive Sleep Apnea              opthy- [facility]       ortho- [place]        endo- Balan        psych  Past Surgical History:    Reviewed history from 03/29/2008 and no changes required:       Appendectomy       Cholecystectomy       Tonsillectomy       Pelvic  US- left ovarian cyst       CT- head- old infarction (11/1997)       Endometrial biopsy- neg (DUB)         Cervical fusion x 3       Left foot surgry- plantar fasciitis       Exp. laparoscopy- intestinal obstruction       Carotid doppler- left stenosis       Dexa- normal (11/2002)       Abd US- echodensity (01/2003)   worse (12/2006)       Left arm fracture (07/2004)       Cardiolite- pos (07/2006)       Cath- stent LCA, OM (08/2006)       EGD - erosive esophagitis (01/2007)       colonoscopy (6/09)- hyperplastic polyp and hemorrhoids   Family History:    Reviewed history from 03/26/2007 and no changes required:       Father: MI's, HTN, skin cancer       Mother: MI's, ? CVA's       Siblings: 1 sister with HTN  Social History:    Reviewed history from 04/21/2008 and no changes required:       Marital Status: Married       Children: none       Occupation: disability       smoking     Review of Systems  General      Complains of fatigue.      Denies loss of appetite and malaise.  Eyes      Denies blurring and itching.  CV      Complains of chest pain or discomfort.      Denies lightheadness, palpitations, shortness of breath with exertion, and swelling of feet.  Resp      Complains of shortness of breath.      Denies cough, pleuritic, and wheezing.  GI      Denies change in bowel habits.  MS      Complains of joint pain, low back pain, mid back pain, and stiffness.  Derm      Denies itching and rash.  Neuro      Complains of tingling.      Denies numbness.  Psych      Denies panic attacks, sense of  great danger, and suicidal thoughts/plans.  Endo      Denies excessive thirst and excessive urination.   Physical Exam  General:     overweight but generally well appearing  Head:     normocephalic, atraumatic, and no abnormalities observed.   Eyes:     vision grossly intact, pupils equal, pupils round, and pupils reactive to light.  no conjunctival pallor, injection or icterus  Mouth:     pharynx pink and moist.   Neck:     supple with full rom and no masses or thyromegally, no JVD or carotid bruit  Chest Wall:     No deformities, masses, or tenderness noted. Lungs:     diffusely distant bs with occ end exp wheeze no rales or crackles  Heart:     Normal rate and regular rhythm. S1 and S2 normal without gallop, murmur, click, rub or other extra sounds. Abdomen:     soft, non-tender, and normal bowel sounds.  no renal bruits  Msk:     No deformity or scoliosis noted of thoracic or lumbar spine.  overall poor rom of TS and LS due to chronic joint pain,  gait is somewhat slow and labored  Pulses:     plus one pedal pulses Extremities:     No clubbing, cyanosis, edema, or deformity noted with normal full range of motion of all joints.   Neurologic:     sensation intact to light touch, gait normal, and DTRs symmetrical and normal.   Skin:     Intact without suspicious lesions or rashes Cervical Nodes:     No lymphadenopathy noted Psych:     somewhat anxious today- but good communication skills and eye contact  features are animated     Impression & Recommendations:  Problem # 1:  HYPERTENSION (ICD-401.9) Assessment: Deteriorated bp is up - but has not started new ARB yet- will update me with name has f/u soon with Dr Talmage Nap as well The following medications were removed from the medication list:    Benicar 40 Mg Tabs (Olmesartan medoxomil) .[name]... 1 by mouth once daily  Her updated medication list for this problem includes:    Hydrochlorothiazide 50 Mg Tabs  (Hydrochlorothiazide) .[name]... Take one by mouth daily    Carvedilol 3.125 Mg Tabs (Carvedilol) ..... One by mouth two times a day  BP today: 140/76 Prior BP: 128/80 (04/21/2008)  Labs Reviewed: Creat: 0.8 (01/15/2008) Chol: 151 (05/27/2008)   HDL: 34.6 (05/27/2008)   LDL: 77 (05/27/2008)   TG: 197 (05/27/2008)  Orders: Venipuncture ([address]) TLB-Lipid Panel (80061-LIPID) TLB-Renal Function Panel (80069-RENAL) TLB-AST (SGOT) (84450-SGOT) TLB-ALT (SGPT) (84460-ALT)   Problem # 2:  HYPERLIPIDEMIA (ICD-272.4) Assessment: Unchanged cholesterol- due for check today with crestor and diet reminded to work on sat fats in diet  goals for LDL - under 70 if poss due to added risk factors of tab and CAD, and previous cva  Her updated medication list for this problem includes:    Crestor 10 Mg Tabs (Rosuvastatin calcium) .[name]... Take one by mouth daily  Labs Reviewed: Chol: 151 (05/27/2008)   HDL: 34.6 (05/27/2008)   LDL: 77 (05/27/2008)   TG: 197 (05/27/2008) SGOT: 27 (05/27/2008)   SGPT: 45 (05/27/2008)  Orders: Venipuncture (09811) TLB-Lipid Panel (80061-LIPID) TLB-Renal Function Panel (80069-RENAL) TLB-AST (SGOT) (84450-SGOT) TLB-ALT (SGPT) (84460-ALT)   Problem # 3:  DIABETES MELLITUS, TYPE II (ICD-250.00) Assessment: Comment Only doing better- followed by Dr Talmage Nap now The following medications were removed from the medication list:    Januvia 100 Mg Tabs (Sitagliptin phosphate) .[name]... 1po qd    Benicar 40 Mg Tabs (Olmesartan medoxomil) .[name]... 1 by mouth once daily  Her updated medication list for this problem includes:    Janumet 50-1000 Mg Tabs (Sitagliptin-metformin hcl) .[name]... Take 1 tablet by mouth two times a day    Aspirin 325 Mg Tabs (Aspirin) .[name]... Take one by mouth daily    Amaryl 4 Mg Tabs (Glimepiride) ..... One by mouth once daily   Problem # 4:  DEPRESSION (ICD-311) Assessment: Unchanged pt chooses not to f/u with psychiatry at this time- did encourage her to re  consider this  overall stable  enc counseling for stress (disc demands of caring for her father with alz)  Problem # 5:  TOBACCO USE (ICD-305.1) Assessment: Unchanged discussed in detail risks of smoking, and possible outcomes including COPD, vascular dz, cancer and also respiratory infections/sinus problems   pt is motivated to go to support group to quit also may try nicotine patch- was ok'd by her cardiol  Problem # 6:  COPD (ICD-496) Assessment: Unchanged stopped advair- no help disc imp of inf prev and smok cess  pt  will call pulm office for f/u pneumonvax today The following medications were removed from the medication list:    Advair Diskus 100-50 Mcg/dose Misc (Fluticasone-salmeterol) ..... Use two times a day as directed  Her updated medication list for this problem includes:    Singulair 10 Mg Tabs (Montelukast sodium) .[name]... Take one by mouth daily    Ventolin Hfa 108 (90 Base) Mcg/act Aers (Albuterol sulfate) .[name]... 2 puffs up to every 4 hours as needed wheeze   Problem # 7:  CORONARY ARTERY DISEASE (ICD-414.00) Assessment: Comment Only cardiac rehab and cardiol f/u as planned  The following medications were removed from the medication list:    Benicar 40 Mg Tabs (Olmesartan medoxomil) .[name]... 1 by mouth once daily  Her updated medication list for this problem includes:    Hydrochlorothiazide 50 Mg Tabs (Hydrochlorothiazide) .[name]... Take one by mouth daily    Plavix 75 Mg Tabs (Clopidogrel bisulfate) .[name]... Take one by mouth daily    Imdur 60 Mg Xr24h-tab (Isosorbide mononitrate) .[name]... Take by mouth two times a day    Aspirin 325 Mg Tabs (Aspirin) .[name]... Take one by mouth daily    Carvedilol 3.125 Mg Tabs (Carvedilol) ..... One by mouth two times a day    Nitrolingual 0.4 Mg/spray Tl Soln (Nitroglycerin) ..... Use as directed   Complete Medication List: 1)  Hydrochlorothiazide 50 Mg Tabs (Hydrochlorothiazide) .... Take one by mouth daily 2)  Potassium Chloride Cr 10 Meq  Cpcr (Potassium chloride) .... Take one by mouth bid 3)  Singulair 10 Mg Tabs (Montelukast sodium) .... Take one by mouth daily 4)  Crestor 10 Mg Tabs (Rosuvastatin calcium) .... Take one by mouth daily 5)  Janumet 50-1000 Mg Tabs (Sitagliptin-metformin hcl) .... Take 1 tablet by mouth two times a day 6)  Lyrica 75 Mg Caps (Pregabalin) .... Take one by mouth tid 7)  Plavix 75 Mg Tabs (Clopidogrel bisulfate) .... Take one by mouth daily 8)  Imdur 60 Mg Xr24h-tab (Isosorbide mononitrate) .... Take by mouth two times a day 9)  Aspirin 325 Mg Tabs (Aspirin) .... Take one by mouth daily 10)  Amaryl 4 Mg Tabs (Glimepiride) .... One by mouth once daily 11)  Mucinex 600 Mg Tb12 (Guaifenesin) .... 2 by mouth two times a day 12)  Omeprazole 20 Mg Cpdr (Omeprazole) .[name].. 1 twice a day 30 minutes before meals 13)  Carvedilol 3.125 Mg Tabs (Carvedilol) .... One by mouth two times a day 14)  Nitrolingual 0.4 Mg/spray Tl Soln (Nitroglycerin) .... Use as directed 15)  Propoxyphene N-apap 100-650 Mg Tabs (Propoxyphene n-apap) .... One by mouth two times a day as needed 16)  Flexeril 10 Mg Tabs (Cyclobenzaprine hcl) .... One by mouth at bedtime 17)  Ventolin Hfa 108 (90 Base) Mcg/act Aers (Albuterol sulfate) .... 2 puffs up to every 4 hours as needed wheeze  Other Orders: Pneumococcal Vaccine (60630) Admin 1st Vaccine (16010)   Patient Instructions: 1)  labs today for cholesterol 2)  let me know what new blood pressure medicine you start  3)  proceed with cardiac rehab 4)  keep working on quitting smoking  5)  pneumovax today     Preventive Care Screening     egg allergy , no flu shots    Pneumovax Vaccine    Vaccine Type: Pneumovax    Site: left deltoid    Mfr: Merck    Dose: 0.5 ml    Route: IM    Given by: Liane Comber    Exp. Date:  10/29/2009    Lot #: [id number]    VIS given: 04/28/96 version given November 04, 2008.

## 2010-10-31 NOTE — Letter (Signed)
Summary: [facility]   Imported By: Maryln Gottron 03/09/2009 10:07:26  _____________________________________________________________________  External Attachment:    Type:   Image     Comment:   External Document

## 2010-10-31 NOTE — Progress Notes (Signed)
Summary: refill request for flexeril  Phone Note Refill Request Message from:  Fax from Pharmacy  Refills Requested: Medication #1:  FLEXERIL 10 MG  TABS one by mouth at bedtime Faxed request from [facility] is on your shelf.  Initial call taken by: Lowella Petties CMA,  June 30, 2009 9:06 AM  Follow-up for Phone Call        EMR says she got #90 in med sept ?  Follow-up by: Judith Part MD,  June 30, 2009 10:24 AM  Additional Follow-up for Phone Call Additional follow up Details #1::        left message for patient to call back  thanks- will hold form on my desk until she calls bac--MT Additional Follow-up by: Benny Lennert CMA Duncan Dull),  June 30, 2009 11:46 AM    Additional Follow-up for Phone Call Additional follow up Details #2::    Left message for patient to call back. Lewanda Rife LPN  July 01, 2009 12:11 PM   Spoke with patient. This request for Flexeril was supposed to go to Dr[facility] Corliss Skains  [facility]. Pt said to please disregard request and she will contact [facility] to have them send to Dr. Corliss Skains. Thank you. Follow-up by: Lewanda Rife LPN,  July 06, 2009 5:18 PM

## 2010-10-31 NOTE — Letter (Signed)
Summary: [facility]  [facility]   Imported By: Maryln Gottron 07/19/2010 14:06:45  _____________________________________________________________________  External Attachment:    Type:   Image     Comment:   External Document

## 2010-10-31 NOTE — Medication Information (Signed)
Summary: [facility]  [facility]   Imported By: Sherian Rein 02/24/2010 14:29:48  _____________________________________________________________________  External Attachment:    Type:   Image     Comment:   External Document

## 2010-10-31 NOTE — Miscellaneous (Signed)
Summary: metoprolol refil  Medications Added METOPROLOL TARTRATE 25 MG  TABS (METOPROLOL TARTRATE) 1 by mouth two times a day       Clinical Lists Changes  Medications: Changed medication from LOPRESSOR   TABS (METOPROLOL TARTRATE TABS) take 25 mg by mouth daily to METOPROLOL TARTRATE 25 MG  TABS (METOPROLOL TARTRATE) 1 by mouth two times a day - Signed Rx of METOPROLOL TARTRATE 25 MG  TABS (METOPROLOL TARTRATE) 1 by mouth two times a day;  #180 x 3;  Signed;  Entered by: Judith Part MD;  Authorized by: Judith Part MD;  Method used: Printed then faxed to    Prescriptions: METOPROLOL TARTRATE 25 MG  TABS (METOPROLOL TARTRATE) 1 by mouth two times a day  #180 x 3   Entered and Authorized by:   Judith Part MD   Signed by:   Judith Part MD on 02/06/2008   Method used:   Printed then faxed to ...         RxID:   [id number]   Appended Document: metoprolol refil faxed to [facility]

## 2010-10-31 NOTE — Progress Notes (Signed)
Summary: BP is elevated  Phone Note Call from Patient Call back at Home Phone [phone]   Caller: Patient Call For: Judith Part MD Summary of Call: Pt states she has been having dizzy spells, even while sitting, for the last couple of weeks.  She checked her BP today and it was 160/110.  she made appt to see you next week but is asking if she should increase any of her meds until then. Initial call taken by: Lowella Petties CMA, AAMA,  July 25, 2010 1:01 PM  Follow-up for Phone Call        bp was good at pulmonary recently  check it several more times at home given the dizziness- I think she needs to see someone (first avail) before next week Follow-up by: Judith Part MD,  July 25, 2010 1:38 PM  Additional Follow-up for Phone Call Additional follow up Details #1::        Patient notified as instructed by telephone. Pt scheduled appt with Dr Sharen Hones 07/26/10 at 12:30pm.Rena [facility] LPN  July 25, 2010 2:30 PM

## 2010-10-31 NOTE — Progress Notes (Signed)
Summary: current cpap download  Phone Note From Other Clinic Call back at [phone]   Caller: Tresa Endo with [facility] Call For: Dr. Craige Cotta Summary of Call: [phone] Tresa Endo with [facility] (DME). When [employer]. the first download order in Jan. 2011, she contacted the pt and the pt advised her that she could download it if she wanted to, but she hasn't been using it since 2007. That is why when you recvd the download it was from 2007.  Apparently the pt is telling you that she is using her cpap 4-5 hours per night and according to the home care she isn't. I have asked them to obtain a current download off Ms. Baze's cpap and then we will know if she is using it or not. Please advise if you would like me to do anything else. Thanks, Initial call taken by: Alfonso Ramus,  November 07, 2009 11:23 AM  Follow-up for Phone Call        Thank you for follow up.  Will await report from current download. Follow-up by: Coralyn Helling MD,  November 07, 2009 11:38 AM

## 2010-10-31 NOTE — Letter (Signed)
Summary: [facility]/Dr. Corliss Skains  [facility]/Dr. Corliss Skains   Imported By: Eleonore Chiquito 11/03/2008 15:37:11  _____________________________________________________________________  External Attachment:    Type:   Image     Comment:   External Document

## 2010-10-31 NOTE — Assessment & Plan Note (Signed)
Summary: cough, no voice/alc   Vital Signs:  Patient profile:   55 year old female Height:      57 inches Weight:      169.25 pounds BMI:     36.76 Temp:     97.8 degrees F oral Pulse rate:   96 / minute Pulse rhythm:   regular Resp:     20 per minute BP sitting:   130 / 78  (left arm) Cuff size:   regular  Vitals Entered By: Lewanda Rife LPN (Feb 17, 2010 4:23 PM) CC: No voice, sinus drainage, productive cough with grayish mucus. Frequency of urine and does not feel like emptying bladder. Irritated perineal area   History of Present Illness: here for f/u of HTN and lipid and tab abuse  pt also has DM followed by endo    last lipids well controlled in feb with trig 153 and HDL 45 and LDL 55   last DM check  sugar is up -- ate too many stawberries  last AIc 7.3  opthy-- has seen Dr Hyacinth Meeker -- in march -- no DM damage  urine today has 3 plus gluc in it   urine symtpoms-- irritation and burning  a little itching  ? if possible yeast - got better with monistat  is more in the front  no discharge at all   HTN is stable with 130/78 today   wt is stable with bmi of 36  has cold symptoms and sinus drainage  takes mucinex  really thick mucous with prod cough is really hoarse  not a lot of wheezing  no fever - no chills or aches - just mostly really tired   her depression is worse  lost more family members - aunt died  a lot of loss is anxious and depression both    has not seen psychiatrist in a year  was on wellbutrin in past  was on neurontin  was on ativan  was ? of bipolar in past -- she disagrees  used to see Dr :? - does not remember his name      Allergies: 1)  Tetracycline 2)  Ace Inhibitors 3)  * Eggs  Past History:  Past Medical History: Last updated: 11/15/2009 Allergic rhinitis Anxiety Tobacco abuse  COPD / emphysema Coronary artery disease- CABG CVA  Depression Diabetes mellitus, type II GERD / erosive  esophagitis Hyperlipidemia Hypertension Obstuctive Sleep Apnea Hyperplastic colon polyps Internal hemorrhoids goiter with thyroid nodules- watched by Dr Talmage Nap   opthyHyacinth Meeker ortho- [place]  endo- [facility]  pulm- [facility]  Past Surgical History: Last updated: 06/14/2009 Appendectomy Cholecystectomy Tonsillectomy Endometrial biopsy- neg (DUB)   Cervical fusion x 3 Left foot surgry- plantar fasciitis Exp. laparoscopy- intestinal obstruction Left arm fracture (07/2004)  Carotid doppler- left stenosis Dexa- normal (11/2002) Abd US- echodensity (01/2003)   worse (12/2006) Cardiolite- pos (07/2006) Cath- stent LCA, OM (08/2006) EGD - erosive esophagitis (01/2007) colonoscopy (6/09)- hyperplastic polyp and hemorrhoids Pelvic US- left ovarian cyst CT- head- old infarction (11/1997)  Family History: Last updated: 11/15/2009 Father: MI's, HTN, skin cancer, died of ca - lung/liver/adrenal -- was a smoker  Mother: MI's, ? CVA's Siblings: 1 sister with HTN No FH of Colon Cancer:  Social History: Last updated: 11/22/2008 Marital Status: Married Children: none Occupation: disability Current Smoker  Risk Factors: Alcohol Use: 0 (10/14/2009)  Risk Factors: Smoking Status: current (10/14/2009) Packs/Day: 1.5 (10/14/2009)  Review of Systems General:  Complains of fatigue; denies chills, fever, loss of  appetite, and malaise. Eyes:  Denies blurring and eye irritation. ENT:  Complains of nasal congestion, postnasal drainage, sinus pressure, and sore throat; denies earache. CV:  Complains of shortness of breath with exertion; denies chest pain or discomfort and palpitations. Resp:  Complains of cough, pleuritic, sputum productive, and wheezing. GI:  Denies diarrhea, nausea, and vomiting. GU:  Complains of dysuria; denies discharge and urinary frequency. MS:  Complains of joint pain; denies joint redness and joint swelling. Derm:  Complains of itching; denies rash. Neuro:   Denies numbness and tingling. Psych:  Complains of anxiety and depression; denies sense of great danger and suicidal thoughts/plans. Endo:  Denies excessive thirst and excessive urination. Heme:  Denies abnormal bruising and bleeding.   Impression & Recommendations:  Problem # 1:  BRONCHITIS- ACUTE (ICD-466.0) Assessment New  will treat with zithromax and update continue copd meds and update if worse wheeze or sob  pt advised to update me if symptoms worsen or do not improve  mucinex for expectorant  Her updated medication list for this problem includes:    Spiriva Handihaler 18 Mcg Caps (Tiotropium bromide monohydrate) ..... One puff once daily    Singulair 10 Mg Tabs (Montelukast sodium) .[name]... Take one by mouth daily    Albuterol Sulfate (2.5 Mg/70ml) 0.083% Nebu (Albuterol sulfate) ..... Use one vial in neb every 4 hours as needed    Ventolin Hfa 108 (90 Base) Mcg/act Aers (Albuterol sulfate) .[name]... 2 puffs up to every 4 hours as needed wheeze    Mucinex 600 Mg Tb12 (Guaifenesin) .[name]... 2 by mouth two times a day    Zithromax Z-pak 250 Mg Tabs (Azithromycin) .[name]... Take by mouth as directed  Orders: Prescription Created Electronically [phone])  Problem # 2:  TOBACCO USE (ICD-305.1) Assessment: Unchanged  discussed in detail risks of smoking, and possible outcomes including COPD, vascular dz, cancer and also respiratory infections/sinus problems  again - pt voiced understanding and aware her copd is worse she will continue to try to quit but is not confident she can   Orders: Prescription Created Electronically [phone])  Problem # 3:  DEPRESSION (ICD-311) Assessment: Deteriorated will start some wellbutrin until we can get est with new psychiatrist  not suicudal- knows to call or seek care if side eff disc coping skills and situaitional stress in detail today Her updated medication list for this problem includes:    Wellbutrin Xl 150 Mg Xr24h-tab (Bupropion hcl) .[name]... 1 by mouth  once daily in am  Orders: Psychiatric Referral (Psych) Prescription Created Electronically [phone])  Problem # 4:  ANXIETY (ICD-300.00) Assessment: Deteriorated see above -ref to psychiatry Her updated medication list for this problem includes:    Wellbutrin Xl 150 Mg Xr24h-tab (Bupropion hcl) .[name]... 1 by mouth once daily in am  Orders: Psychiatric Referral (Psych) Prescription Created Electronically [id number])  Problem # 5:  VAGINITIS, CANDIDAL (ICD-112.1) Assessment: New  tx with terazol cream as directed  update if not imp  Orders: Prescription Created Electronically [phone]) UA Dipstick W/ Micro (manual) (69629) Wet Prep (52841LK)  Complete Medication List: 1)  Spiriva Handihaler 18 Mcg Caps (Tiotropium bromide monohydrate) .... One puff once daily 2)  Singulair 10 Mg Tabs (Montelukast sodium) .... Take one by mouth daily 3)  Albuterol Sulfate (2.5 Mg/33ml) 0.083% Nebu (Albuterol sulfate) .... Use one vial in neb every 4 hours as needed 4)  Ventolin Hfa 108 (90 Base) Mcg/act Aers (Albuterol sulfate) .... 2 puffs up to every 4 hours as needed wheeze 5)  Clarinex  5 Mg Tabs (Desloratadine) .[name].. 1 by mouth once daily 6)  Hydrochlorothiazide 50 Mg Tabs (Hydrochlorothiazide) .... Take one by mouth daily 7)  Potassium Chloride Cr 10 Meq Cpcr (Potassium chloride) .... Take one by mouth two times a day 8)  Crestor 10 Mg Tabs (Rosuvastatin calcium) .... Take one by mouth daily 9)  Janumet 50-1000 Mg Tabs (Sitagliptin-metformin hcl) .... Take 1 tablet by mouth two times a day 10)  Plavix 75 Mg Tabs (Clopidogrel bisulfate) .... Take one by mouth daily 11)  Imdur 60 Mg Xr24h-tab (Isosorbide mononitrate) .... Take by mouth two times a day 12)  Aspirin 325 Mg Tabs (Aspirin) .... Take one by mouth daily with food 13)  Amaryl 2 Mg Tabs (Glimepiride) .... Take 1 tablet by mouth two times a day 14)  Mucinex 600 Mg Tb12 (Guaifenesin) .... 2 by mouth two times a day 15)  Omeprazole 20 Mg Cpdr  (Omeprazole) .[name].. 1 twice a day 30 minutes before meals. 16)  Carvedilol 3.125 Mg Tabs (Carvedilol) .... Two by mouth two times a day 17)  Nitrolingual 0.4 Mg/spray Tl Soln (Nitroglycerin) .... Use as directed 18)  Flexeril 10 Mg Tabs (Cyclobenzaprine hcl) .... One by mouth at bedtime as needed 19)  Neurontin 600 Mg Tabs (Gabapentin) .... As directed 20)  Ranexa 500 Mg Xr12h-tab (Ranolazine) .... Take 1 tablet by mouth two times a day 21)  Micardis 40 Mg Tabs (Telmisartan) .... Take 1 tablet by mouth once a day 22)  Wellbutrin Xl 150 Mg Xr24h-tab (Bupropion hcl) .[name].. 1 by mouth once daily in am 23)  Terazol 3 0.8 % Crea (Terconazole) .... Insert intravaginally and use externally as directed at bedtime for 3 days 24)  Zithromax Z-pak 250 Mg Tabs (Azithromycin) .... Take by mouth as directed  Patient Instructions: 1)  we will do psychiatric referral at check out  2)  you have yeast infection- use the terazol cream as directed and update me if not improved 3)  keep thinking about quitting smoking 4)  start the wellbutrin xl 150  5)  take the zithromax as directed for bronchitis 6)  drink lots of fluids  7)  consider counseling with hospice - they have good support availible 8)  I sent px to your pharmacy  Prescriptions: ZITHROMAX Z-PAK 250 MG TABS (AZITHROMYCIN) take by mouth as directed  #1 pack x 0   Entered and Authorized by:   Judith Part MD   Signed by:   Judith Part MD on 02/17/2010   Method used:   [facility]        [facility] [address] [phone]* (retail)       [address]       [place], [place]  [address]       Ph: [phone] or [phone]       Fax: [phone]   RxID:   [id number] WELLBUTRIN XL 150 MG XR24H-TAB (BUPROPION HCL) 1 by mouth once daily in am  #30 x 5   Entered and Authorized by:   Judith Part MD   Signed by:   Judith Part MD on 02/17/2010   Method used:   [facility]ically to        [facility] [address]  [phone]* (retail)       [address]       [place], [place]  [address]  Ph: [phone] or [phone]       Fax: [phone]   RxID:   [id number] TERAZOL 3 0.8 % CREA (TERCONAZOLE) insert intravaginally and use externally as directed at bedtime for 3 days  #1 course x 1   Entered and Authorized by:   Judith Part MD   Signed by:   Judith Part MD on 02/17/2010   Method used:   [facility] to        [facility] [address] [phone]* (retail)       [address]       [place], [place]  [address]       Ph: [phone] or [phone]       Fax: [phone]   RxID:   [id number]   Current Allergies (reviewed today): TETRACYCLINE ACE INHIBITORS * EGGS  Laboratory Results   Urine Tests  Date/Time Received: Feb 17, 2010 4:26 PM  Date/Time Reported: Feb 17, 2010 4:26 PM   Routine Urinalysis   Color: yellow Appearance: Clear Glucose: 500   (Normal Range: Negative) Bilirubin: negative   (Normal Range: Negative) Ketone: negative   (Normal Range: Negative) Spec. Gravity: 1.010   (Normal Range: 1.003-1.035) Blood: trace-lysed   (Normal Range: Negative) pH: 6.0   (Normal Range: 5.0-8.0) Protein: negative   (Normal Range: Negative) Urobilinogen: 0.2   (Normal Range: 0-1) Nitrite: negative   (Normal Range: Negative) Leukocyte Esterace: negative   (Normal Range: Negative)  Urine Microscopic WBC/HPF: 0 RBC/HPF: 0 Bacteria/HPF: few Mucous/HPF: few Epithelial/HPF: 1-3 Crystals/HPF: 1 Casts/LPF: 0 Yeast/HPF: 0 Other: 0      Wet Mount Source: vaginal  WBC/hpf: 1-5 Bacteria/hpf: rare  Rods Clue cells/hpf: none  Negative whiff Yeast/hpf: moderate Wet Mount KOH: Negative Trichomonas/hpf: none    Laboratory Results   Urine Tests    Routine Urinalysis   Color: yellow Appearance: Clear Glucose: 500   (Normal Range: Negative) Bilirubin: negative   (Normal Range: Negative) Ketone: negative   (Normal  Range: Negative) Spec. Gravity: 1.010   (Normal Range: 1.003-1.035) Blood: trace-lysed   (Normal Range: Negative) pH: 6.0   (Normal Range: 5.0-8.0) Protein: negative   (Normal Range: Negative) Urobilinogen: 0.2   (Normal Range: 0-1) Nitrite: negative   (Normal Range: Negative) Leukocyte Esterace: negative   (Normal Range: Negative)  Urine Microscopic WBC/hpf: 0 RBC/hpf: 0 Bacteria: few Mucous: few Epithelial: 1-3 Crystals/LPF: 1 Casts/LPF: 0 Yeast/HPF: 0 Other: 0      Wet Mount/KOH  Rods  Negative whiff KOH Negative

## 2010-10-31 NOTE — Progress Notes (Signed)
Summary: needs scripts for asa and mucinex  Phone Note Call from Patient Call back at [phone]   Caller: Patient Call For: Judith Part MD Summary of Call: Pt is asking if she can have written scripts for her mucinex and aspirin.  She said she gets these from you every year. Initial call taken by: Lowella Petties CMA,  August 15, 2009 3:00 PM  Follow-up for Phone Call        printed in put in nurse in box for pickup  Follow-up by: Judith Part MD,  August 15, 2009 3:19 PM  Additional Follow-up for Phone Call Additional follow up Details #1::        Pt to pick up. Additional Follow-up by: Lowella Petties CMA,  August 15, 2009 4:03 PM    New/Updated Medications: ASPIRIN 325 MG  TABS (ASPIRIN) take one by mouth daily with food MUCINEX 600 MG  TB12 (GUAIFENESIN) 2 by mouth two times a day as needed Prescriptions: MUCINEX 600 MG  TB12 (GUAIFENESIN) 2 by mouth two times a day as needed  #3 months x 3   Entered and Authorized by:   Judith Part MD   Signed by:   Judith Part MD on 08/15/2009   Method used:   Print then Give to Patient   RxID:   [id number] ASPIRIN 325 MG  TABS (ASPIRIN) take one by mouth daily with food  #90 x 3   Entered and Authorized by:   Judith Part MD   Signed by:   Judith Part MD on 08/15/2009   Method used:   Print then Give to Patient   RxID:   [id number]

## 2010-10-31 NOTE — Progress Notes (Signed)
Summary: form for diabetic supplies  Phone Note From Pharmacy   Caller: [facility] Summary of Call: Form for diabetic supplies is on your shelf, pt is agreeable to this. Initial call taken by: Lowella Petties CMA,  March 10, 2010 2:49 PM  Follow-up for Phone Call        send please to endocrine Dr Talmage Nap since she is looking after Ms Arabie's DM  Follow-up by: Judith Part MD,  March 10, 2010 3:56 PM  Additional Follow-up for Phone Call Additional follow up Details #1::        form faxed to Dr Talmage Nap [phone] as instructed.Lewanda Rife LPN  March 10, 2010 5:10 PM

## 2010-10-31 NOTE — Progress Notes (Signed)
Summary: Still sick  Phone Note Call from Patient Call back at [phone]   Caller: Patient Call For: Dr. Milinda Antis Summary of Call: Patient was in to see Dr. Alphonsus Sias about 2 weeks ago and is not better.  Patient c/o productive cough-cloudy grayish, no fever, having chills, voice is gone.  What do you recommend?  No appts available today.  [facility] Initial call taken by: Sydell Axon,  December 06, 2008 11:08 AM  Follow-up for Phone Call        I want to cover with zithromax for bact infection - px written on EMR for call in and follow up later this week with me please to listen to her Follow-up by: Judith Part MD,  December 06, 2008 1:45 PM  Additional Follow-up for Phone Call Additional follow up Details #1::        Advised patient.  pt also request albuterol for her nebulizer called in(med was not in EMR, but was in paper chart so I added it.......................................................Marland KitchenNatasha Jakaiya Netherland December 06, 2008 3:19 PM     New/Updated Medications: ZITHROMAX Z-PAK 250 MG TABS (AZITHROMYCIN) take by mouth as directed ALBUTEROL SULFATE (2.5 MG/3ML) 0.083% NEBU (ALBUTEROL SULFATE) use one vial in neb every 4 hours as needed   Prescriptions: ALBUTEROL SULFATE (2.5 MG/3ML) 0.083% NEBU (ALBUTEROL SULFATE) use one vial in neb every 4 hours as needed  #1 box x 1   Entered by:   Liane Comber   Authorized by:   Judith Part MD   Signed by:   Liane Comber on 12/06/2008   Method used:   [facility]ron[facility] to        [facility] [address] [phone]* (retail)       [address]       [place], [place]  [address]       Ph: [phone] or [phone]       Fax: [phone]   RxID:   [id number] ZITHROMAX Z-PAK 250 MG TABS (AZITHROMYCIN) take by mouth as directed  #1 pk x 0   Entered by:   Liane Comber   Authorized by:   Judith Part MD   Signed by:   Liane Comber on 12/06/2008   Method used:   Electronically to        [facility] [address] [phone]* (retail)       [address]       [place], [place]  [address]       Ph: [phone] or [phone]       Fax: [phone]   RxID:   [id number]

## 2010-10-31 NOTE — Progress Notes (Signed)
Summary: ? spiriva refill - refills sent to [facility] 8.29.11  Phone Note From Pharmacy Call back at [phone]   Caller: [email or web address][email or web address] Call For: sood  Summary of Call: Requesting refill on sprirva handihaler 18 micrograms. Initial call taken by: Darletta Moll,  June 15, 2010 3:23 PM  Follow-up for Phone Call        We just sent refill for spiriva to [id number] with 2 rf on 05/29/10.  Called pt to confirm that she is needing r sent elsewhere.  Had to [facility]  June 15, 2010 3:39 PM'  ATC pt, LMOM TCB. Boone Master CNA/MA  June 16, 2010 12:02 PM   Additional Follow-up for Phone Call Additional follow up Details #1::        patient returned call.  she states that she get her spiriva from either [facility] or [facility].  pt states since we sent refills to [facility], not to worry about sending refills to [facility] - has not received her shipment of spiriva from [facility] but has enough at home for now; states will call if her supply gets low and hasn't gotten her meds from [facility] so that we may call them for her.  will call Selena Batten @ [facility] to inform her to cancel this refill.  also, scheduled appt w/ VS for patient as his Oct sched was not available at last ov.  appt made for 10.13.11 @ 1330.  pt okay with this date and time. Boone Master CNA/MA  June 16, 2010 12:14 PM   called spoke with Jasmine December tech w/ [facility] and informed her of the conversation with patient.  Renea Ee stated that the refill request will be cancelled. Boone Master CNA/MA  June 16, 2010 12:18 PM

## 2010-10-31 NOTE — Progress Notes (Signed)
Summary: triage   Phone Note Call from Patient Call back at Home Phone [phone]   Caller: Patient Call For: Dr. Russella Dar Reason for Call: Talk to Nurse Summary of Call: pt came into office to report that she is having worsening reflux, choking, and dysphagia... would like to be worked in... ok to see Amy or Gunnar Fusi Initial call taken by: Vallarie Mare,  July 13, 2010 2:00 PM  Follow-up for Phone Call        Patient  has severeal month hx of dysphagia.  Taking omeprazole two times a day.  She was advised by her priemary care to come back and see Dr Russella Dar.  No available schedules at this time.  She has asked me when the schedule is released to get her a Thurs or Fri appointment and mail her a letter.  Follow-up by: Darcey Nora RN, CGRN,  July 13, 2010 3:33 PM  Additional Follow-up for Phone Call Additional follow up Details #1::        Patient  is scheduled to see Dr Russella Dar on 09/14/10.  I have mailed her a letter. Additional Follow-up by: Darcey Nora RN, CGRN,  July 14, 2010 8:41 AM

## 2010-10-31 NOTE — Procedures (Signed)
Summary: Colonoscopy   Colonoscopy  Procedure date:  03/17/2008  Findings:      Location:  [facility].    Procedures Next Due Date:    Colonoscopy: 03/2018  Patient Name: Diane [name], Diane [name] MRN:  Procedure Procedures: Colonoscopy CPT: 29518.    with polypectomy. CPT: A3573898.  Personnel: Endoscopist: Venita Lick. Russella Dar, MD, Clementeen Graham.  Exam Location: Exam performed in Outpatient Clinic. Outpatient  Patient Consent: Procedure, Alternatives, Risks and Benefits discussed, consent obtained, from patient. Consent was obtained by the RN.  Indications  Average Risk Screening Routine.  History  Current Medications: Patient is not currently taking Coumadin.  Pre-Exam Physical: Performed Mar 17, 2008. Cardio-pulmonary exam, Rectal exam, HEENT exam , Abdominal exam, Mental status exam WNL.  Comments: Pt. history reviewed/updated, physical exam performed prior to initiation of sedation?Yes Exam Exam: Extent of exam reached: Cecum, extent intended: Cecum.  The cecum was identified by appendiceal orifice and IC valve. Time to Cecum: 00:02: 45. Time for Withdrawl: 00:10:04. Colon retroflexion performed. ASA Classification: II. Tolerance: excellent.  Monitoring: Pulse and BP monitoring, Oximetry used. Supplemental O2 given.  Colon Prep Used MoviPrep for colon prep. Prep results: excellent.  Sedation Meds: Patient assessed and found to be appropriate for moderate (conscious) sedation. Fentanyl 50 mcg. given IV. Versed 6 mg. given IV.  Findings NORMAL EXAM: Cecum to Descending Colon.  POLYP: Sigmoid Colon, Maximum size: 4 mm. sessile polyp. Procedure:  snare without cautery, removed, retrieved, Polyp sent to pathology. ICD9: Colon Polyps: 211.3.  HEMORRHOIDS: Internal. Size: Small. Not bleeding. Not thrombosed. ICD9: Hemorrhoids, Internal: 455.0.   Assessment  Diagnoses: 211.3: Colon Polyps.  455.0: Hemorrhoids, Internal.   Events  Unplanned Interventions: No  intervention was required.  Unplanned Events: There were no complications. Plans  Post Exam Instructions: Post sedation instructions given. Restart medications: Plavix today.  Medication Plan: Await pathology.  Patient Education: Patient given standard instructions for: Polyps. Hemorrhoids.  Disposition: After procedure patient sent to recovery. After recovery patient sent home.  Scheduling/Referral: Colonoscopy, to [facility] T. Russella Dar, MD, [facility], if polyp adenomatous, around Mar 17, 2013.  Colonoscopy, to [facility] T. Russella Dar, MD, [facility], if polyp not neoplastic, around Mar 17, 2018.     cc.   Marne Tower,MD     REPORT OF SURGICAL PATHOLOGY   Case #: [id number] Patient Name: Diane [name], Diane [name]. Office Chart Number:  [id number]   MRN: [id number] Pathologist: H. Hollice Espy, MD DOB/Age  [birth year] (Age: 55)    Gender: F Date Taken:  03/17/2008 Date Received: 03/17/2008   FINAL DIAGNOSIS   ***MICROSCOPIC EXAMINATION AND DIAGNOSIS***   COLON, SIGMOID, POLYP, BIOPSY:  HYPERPLASTIC POLYP(S).  NO ADENOMATOUS CHANGE OR MALIGNANCY IDENTIFIED.   COMMENT There are benign colorectal type glands that have a serrated architecture consistent with a hyperplastic polyp(s). No adenomatous changes or evidence of malignancy is identified.   cc Date Reported:  03/18/2008     H. Hollice Espy, MD *** Electronically Signed Out By [facility] ***   Clinical information Screening.  R/O adenoma (mw)    specimen(s) obtained Colon, biopsy, sigmoid   Gross Description Received in formalin is a tan, soft tissue fragment that is submitted in toto.  Size:  0.4 cm/1 block (TB:jy) 03/17/08   jy/     Signed by Meryl Dare MD FACG on 03/18/2008 at 2:45 PM  ________________________________________________________________________ colon 03/2018   Signed by Meryl Dare MD FACG on 03/18/2008 at 2:45 PM     March 18, 2008 MRN: [id number]  Diane [name] [address] [place], [place]   [address]    Dear Ms. Lutes,  I am pleased to inform you that the colon polyp(s) removed during your recent colonoscopy was (were) found to be benign (no cancer detected) upon pathologic examination. The polyp was hyperplastic and therefore was not precancerous.  I recommend you have a repeat colonoscopy examination in 10 years.  Should you develop new or worsening symptoms of abdominal pain, bowel habit changes or bleeding from the rectum or bowels, please schedule an evaluation with either your primary care physician or with me.  No further action with gastroenterology is needed at this time. Please      follow-up with your primary care physician for your other healthcare      needs.  Continue treatment plan as outlined the day of your exam.  Please call us if you are having persistent problems or have questions about your condition that have not been fully answered at this time.  Sincerely,  Meryl Dare MD [facility]  This letter has been electronically signed by your physician.   This report was created from the original endoscopy report, which was reviewed and signed by the above listed endoscopist.   Appended Document: Colonoscopy     Clinical Lists Changes  Observations: Added new observation of PAST SURG HX: Appendectomy Cholecystectomy Tonsillectomy Pelvic US- left ovarian cyst CT- head- old infarction (11/1997) Endometrial biopsy- neg (DUB)   Cervical fusion x 3 Left foot surgry- plantar fasciitis Exp. laparoscopy- intestinal obstruction Carotid doppler- left stenosis Dexa- normal (11/2002) Abd US- echodensity (01/2003)   worse (12/2006) Left arm fracture (07/2004) Cardiolite- pos (07/2006) Cath- stent LCA, OM (08/2006) EGD - erosive esophagitis (01/2007) colonoscopy (6/09)- hyperplastic polyp and hemorrhoids  (03/29/2008 10:46)          Past Surgical History:    Appendectomy    Cholecystectomy    Tonsillectomy    Pelvic US- left ovarian cyst     CT- head- old infarction (11/1997)    Endometrial biopsy- neg (DUB)      Cervical fusion x 3    Left foot surgry- plantar fasciitis    Exp. laparoscopy- intestinal obstruction    Carotid doppler- left stenosis    Dexa- normal (11/2002)    Abd US- echodensity (01/2003)   worse (12/2006)    Left arm fracture (07/2004)    Cardiolite- pos (07/2006)    Cath- stent LCA, OM (08/2006)    EGD - erosive esophagitis (01/2007)    colonoscopy (6/09)- hyperplastic polyp and hemorrhoids

## 2010-10-31 NOTE — Progress Notes (Signed)
Summary: Rx for 90 day supply  Phone Note Refill Request Call back at [phone] Message from:  [facility] on October 21, 2009 10:15 AM  Refills Requested: Medication #1:  POTASSIUM CHLORIDE CR 10 MEQ  CPCR take one by mouth bid   Supply Requested: 3 months Received faxed refill request for 90 day supply.  Fax # [phone]   Method Requested: Fax to Mail Away Pharmacy Initial call taken by: Linde Gillis CMA Duncan Dull),  October 21, 2009 10:16 AM  Follow-up for Phone Call        printed in put in nurse in box for pickup  Follow-up by: Judith Part MD,  October 21, 2009 1:21 PM

## 2010-10-31 NOTE — Consult Note (Signed)
Summary: Consultation Report  Consultation Report   Imported By: Eleonore Chiquito 05/08/2007 08:00:28  _____________________________________________________________________  External Attachment:    Type:   Image     Comment:   External Document

## 2010-10-31 NOTE — Miscellaneous (Signed)
Summary: Orders Update pft charges  Clinical Lists Changes  Orders: Added new Service order of Carbon Monoxide diffusing w/capacity (94720) - Signed Added new Service order of Lung Volumes (94240) - Signed Added new Service order of Spirometry (Pre & Post) (94060) - Signed 

## 2010-10-31 NOTE — Medication Information (Signed)
Summary: [facility]  [facility]   Imported By: Sherian Rein 08/14/2010 09:04:47  _____________________________________________________________________  External Attachment:    Type:   Image     Comment:   External Document

## 2010-10-31 NOTE — Procedures (Signed)
Summary: EGD   EGD  Procedure date:  01/30/2007  Findings:      Location: [facility]  Findings: Esophagitis  Findings: Gastritis    EGD  Procedure date:  01/30/2007  Findings:      Location: [facility]  Findings: Esophagitis  Findings: Gastritis   Patient Name: Diane [name], Diane [name]. MRN:  Procedure Procedures: Panendoscopy (EGD) CPT: 43235.    with biopsy(s)/brushing(s). CPT: D1846139.  Personnel: Endoscopist: Venita Lick. Russella Dar, MD, Clementeen Graham.  Exam Location: Exam performed in Outpatient Clinic. Outpatient  Patient Consent: Procedure, Alternatives, Risks and Benefits discussed, consent obtained, from patient. Consent was obtained by the RN.  Indications Symptoms: Abdominal pain, location: RUQ. Reflux symptoms  History  Current Medications: Patient is not currently taking Coumadin.  Pre-Exam Physical: Performed Jan 30, 2007  Cardio-pulmonary exam, HEENT exam, Abdominal exam, Mental status exam WNL.  Comments: Pt. history reviewed/updated, physical exam performed prior to initiation of sedation?Yes Exam Exam Info: Maximum depth of insertion Duodenum, intended Duodenum. Vocal cords not visualized. Gastric retroflexion performed. ASA Classification: III. Tolerance: excellent.  Sedation Meds: Patient assessed and found to be appropriate for moderate (conscious) sedation. Fentanyl 50 mcg. given IV. Versed 4 mg. given IV. Cetacaine Spray 2 sprays given aerosolized. Diphenhydramine 50mg  given IV.  Monitoring: BP and pulse monitoring done. Oximetry used. Supplemental O2 given  Findings Normal: Mid Esophagus.  ESOPHAGEAL INFLAMMATION: Severity is moderate, erosions present.  [place] Classification: Grade A. ICD9: Esophagitis, Reflux: 530.11. Comments: z-line @ 38cm.  Normal: Cardia.  MUCOSAL ABNORMALITY: Fundus to Body. Erythematous mucosa. Friable mucosa. Granular mucosa. Biopsy/Mucosal Abn taken. ICD9: Gastritis, Unspecified: 535.50.   Normal: Duodenal Bulb to Duodenal 2nd Portion.   Assessment  Diagnoses: 535.50: Gastritis, Unspecified.  530.11: Esophagitis, Reflux.   Events  Unplanned Intervention: No unplanned interventions were required.  Unplanned Events: There were no complications. Plans Medication(s): Await pathology. PPI: QAM, for indefinitely.   Patient Education: Patient given standard instructions for: Reflux. Mucosal Abnormality.  Disposition: After procedure patient sent to recovery. After recovery patient sent home.  Scheduling: Office Visit, to [employer]. Russella Dar, MD, [facility], around Mar 02, 2007.    This report was created from the original endoscopy report, which was reviewed and signed by the above listed endoscopist.

## 2010-10-31 NOTE — Progress Notes (Signed)
Summary: Plavix clearance  Phone Note Outgoing Call Call back at [phone]   Call placed by: Christie Nottingham CMA,  January 27, 2008 11:41 AM Call placed to: Patient Action Taken: Appt scheduled Details for Reason: Plavix clearance Summary of Call: Pt was told she could  come off her Plavix 7 days prior to her colonoscopy per Dr. Reyes Ivan. Pt was sceduled for previsit on 03/10/08 @3 :00pm and colonoscopy on 03/17/08 @ 11:00. Pt verbalized understanding. Initial call taken by: Christie Nottingham CMA,  January 27, 2008 11:46 AM

## 2010-10-31 NOTE — Progress Notes (Signed)
Summary: refill request for clarinex- from [facility]   Phone Note Refill Request Message from:  Fax from Pharmacy  Refills Requested: Medication #1:  CLARINEX 5 MG TABS 1 by mouth once daily Faxed form from [facility] is on your shelf.  This was recently sent to [facility].  Initial call taken by: Lowella Petties CMA,  January 03, 2010 1:06 PM  Follow-up for Phone Call        let her know this was recently sent to [facility] and see what she wants [place] to do  Follow-up by: Judith Part MD,  January 03, 2010 1:37 PM  Additional Follow-up for Phone Call Additional follow up Details #1::        Pt states that she has been using [facility] but her insurance requires that she use [facility].  Please set the form aside for now, she may need it later.  Lowella Petties CMA  January 03, 2010 3:44 PM  Form is in a file folder at [employer] if needed.Lewanda Rife LPN  January 04, 271 4:44 PM

## 2010-10-31 NOTE — Progress Notes (Signed)
Summary: refill request for ventolin  Phone Note Refill Request Message from:  Fax from Pharmacy  Refills Requested: Medication #1:  VENTOLIN HFA 108 (90 BASE) MCG/ACT  AERS 2 puffs up to every 4 hours as needed wheeze Faxed form from [facility] is on your shelf.  Pt states she is now getting her meds from [facility], not [facility].  Initial call taken by: Lowella Petties CMA,  January 12, 2010 4:47 PM  Follow-up for Phone Call        form done and in nurse in box  Follow-up by: Judith Part MD,  January 13, 2010 8:05 AM  Additional Follow-up for Phone Call Additional follow up Details #1::        completed form faxed to [phone] as iinstructed. Form is at my desk if needed later.Lewanda Rife LPN  January 13, 2010 4:51 PM     New/Updated Medications: VENTOLIN HFA 108 (90 BASE) MCG/ACT  AERS (ALBUTEROL SULFATE) 2 puffs up to every 4 hours as needed wheeze Prescriptions: VENTOLIN HFA 108 (90 BASE) MCG/ACT  AERS (ALBUTEROL SULFATE) 2 puffs up to every 4 hours as needed wheeze  #3 months x 3   Entered by:   Lewanda Rife LPN   Authorized by:   Judith Part MD   Signed by:   Lewanda Rife LPN on 73/22/0254   Method used:   Historical   RxID:   [id number]

## 2010-10-31 NOTE — Progress Notes (Signed)
Summary: refill requests from [facility]  Phone Note Refill Request Message from:  Fax from Pharmacy  Refills Requested: Medication #1:  HYDROCHLOROTHIAZIDE 50 MG  TABS take one by mouth daily  Medication #2:  CRESTOR 10 MG  TABS take one by mouth daily  Medication #3:  PLAVIX 75 MG  TABS take one by mouth daily  Medication #4:  SINGULAIR 10 MG  TABS take one by mouth daily Also potassium, faxed form from [facility] is on your shelf.  Initial call taken by: Lowella Petties,  Feb 02, 2009 9:15 AM  Follow-up for Phone Call        px printed out for fax   Follow-up by: Judith Part MD,  Feb 02, 2009 1:11 PM  Additional Follow-up for Phone Call Additional follow up Details #1::        Rx faxed to pharmacy Additional Follow-up by: Liane Comber,  Feb 02, 2009 3:23 PM    New/Updated Medications: HYDROCHLOROTHIAZIDE 50 MG  TABS (HYDROCHLOROTHIAZIDE) take one by mouth daily SINGULAIR 10 MG  TABS (MONTELUKAST SODIUM) take one by mouth daily CRESTOR 10 MG  TABS (ROSUVASTATIN CALCIUM) take one by mouth daily PLAVIX 75 MG  TABS (CLOPIDOGREL BISULFATE) take one by mouth daily   Prescriptions: SINGULAIR 10 MG  TABS (MONTELUKAST SODIUM) take one by mouth daily  #90 x 3   Entered and Authorized by:   Judith Part MD   Signed by:   Judith Part MD on 02/02/2009   Method used:   Printed then faxed to ...       [facility] [address] [phone]* (retail)       [address]       [place], [place]  [address]       Ph: [phone] or [phone]       Fax: [phone]   RxID:   [id number] PLAVIX 75 MG  TABS (CLOPIDOGREL BISULFATE) take one by mouth daily  #90 x 3   Entered and Authorized by:   Judith Part MD   Signed by:   Judith Part MD on 02/02/2009   Method used:   Printed then faxed to ...       [facility] [address] [phone]* (retail)       [address]       [place], [place]  [address]       Ph:  [phone] or [phone]       Fax: [phone]   RxID:   [id number] CRESTOR 10 MG  TABS (ROSUVASTATIN CALCIUM) take one by mouth daily  #90 x 3   Entered and Authorized by:   Judith Part MD   Signed by:   Judith Part MD on 02/02/2009   Method used:   Printed then faxed to ...       [facility] [address] [phone]* (retail)       [address]       [place], [place]  [address]       Ph: [phone] or [phone]       Fax: [phone]   RxID:   [id number] POTASSIUM CHLORIDE CR 10 MEQ  CPCR (POTASSIUM CHLORIDE) take one by mouth bid  #180 x 3   Entered and Authorized by:   Judith Part MD   Signed  by:   Judith Part MD on 02/02/2009   Method used:   Printed then faxed to ...       [facility] [address] [phone]* (retail)       [address]       [place], [place]  [address]       Ph: [phone] or [phone]       Fax: [phone]   RxID:   [id number] HYDROCHLOROTHIAZIDE 50 MG  TABS (HYDROCHLOROTHIAZIDE) take one by mouth daily  #90 x 3   Entered and Authorized by:   Judith Part MD   Signed by:   Judith Part MD on 02/02/2009   Method used:   Printed then faxed to ...       [facility] [address] [phone]* (retail)       [address]       [place], [place]  [address]       Ph: [phone] or [phone]       Fax: [phone]   RxID:   [id number]

## 2010-10-31 NOTE — Medication Information (Signed)
Summary: [facility]  [facility]   Imported By: Lester Victoria 06/22/2010 11:28:15  _____________________________________________________________________  External Attachment:    Type:   Image     Comment:   External Document

## 2010-10-31 NOTE — Assessment & Plan Note (Signed)
Summary: DIZZINESS AND ELEVATED BP PER DR TOWER/RI   Vital Signs:  Patient profile:   55 year old female Weight:      170.75 pounds Temp:     98.0 degrees F oral Pulse rate:   72 / minute Pulse (ortho):   70 / minute Pulse rhythm:   regular BP standing:   128 / 70  Vitals Entered By: Selena Batten Dance CMA Duncan Dull) (July 26, 2010 12:45 PM) CC: Dizziness and increased B/P   Serial Vital Signs/Assessments:  Time      Position  BP       Pulse  Resp  Temp     By 12:46 PM  Lying LA  130/70   72                    Kim Dance CMA (AAMA) 12:46 PM  Sitting   130/70   72                    Kim Dance CMA (AAMA) 12:46 PM  Standing  128/70   70                    Kim Dance CMA (AAMA)   History of Present Illness: CC: dizziness, elevated BP  3wk h/o dizziness, described as disoriented, lightheaded, almost blacking out x1.  Mainly when sitting, then gets vertigo.  Mainly lightheadedness and presyncope.  No dizziness laying down.  Mainly standing and sitting.  Random.  Not worse when first gets up, not orthostatic.  No problems with getting up to bathroom at night.    No change CP/tightness, SOB, coughing, palpitations, HA, no n/v, no slurring of speech, no trouble with weakness on one side of body.  Smoking 1 - 1 1/2 ppd.  to start patches and gum and has Ecigarette.    Recent carotid doppler - both sides 60-70% blocked, no surgery.  to recheck every year.  bps while dizzy: 160/110 HR 92, 184/116 HR 105, 172/102 HR 113, 182/123 HR 111 bps while not dizzy: 144/80 HR 95, 125/82 HR 96.  BP checked on home machine and office cuff in clinic today and about the same.  Meds reviewed, recently went up on amaryl and coreg.  No new meds recently.  Current Medications (verified): 1)  Spiriva Handihaler 18 Mcg Caps (Tiotropium Bromide Monohydrate) .... One Puff Once Daily 2)  Singulair 10 Mg  Tabs (Montelukast Sodium) .... Take One By Mouth Daily 3)  Albuterol Sulfate (2.5 Mg/24ml) 0.083% Nebu (Albuterol  Sulfate) .... Use One Vial in [place] Every 4 Hours As Needed 4)  Ventolin Hfa 108 (90 Base) Mcg/act  Aers (Albuterol Sulfate) .... 2 Puffs Up To Every 4 Hours As Needed Wheeze 5)  Flonase 50 Mcg/act Susp (Fluticasone Propionate) .... One Spray Once Daily 6)  Clarinex 5 Mg Tabs (Desloratadine) .[name].. 1 By Mouth Once Daily 7)  Hydrochlorothiazide 50 Mg  Tabs (Hydrochlorothiazide) .... Take One By Mouth Daily 8)  Potassium Chloride Cr 10 Meq  Cpcr (Potassium Chloride) .... Take One By Mouth Two Times A Day 9)  Crestor 10 Mg  Tabs (Rosuvastatin Calcium) .... Take One By Mouth Daily 10)  Janumet 50-1000 Mg Tabs (Sitagliptin-Metformin Hcl) .... Take 1 Tablet By Mouth Two Times A Day 11)  Plavix 75 Mg  Tabs (Clopidogrel Bisulfate) .... Take One By Mouth Daily 12)  Imdur 60 Mg  Xr24h-Tab (Isosorbide Mononitrate) .... Take By Mouth Two Times A Day 13)  Aspirin 325  Mg  Tabs (Aspirin) .... Take One By Mouth Daily With Food 14)  Amaryl 2 Mg Tabs (Glimepiride) .... 2 Every Am and 1 Every Pm 15)  Mucinex 600 Mg  Tb12 (Guaifenesin) .... 2 By Mouth Two Times A Day 16)  Omeprazole 20 Mg  Cpdr (Omeprazole) .[name].. 1 Twice A Day 30 Minutes Before Meals. 17)  Carvedilol 12.5 Mg Tabs (Carvedilol) .... Take 1 Tablet By Mouth Two Times A Day 18)  Nitrolingual 0.4 Mg/spray Tl Soln (Nitroglycerin) .... Use As Directed 19)  Flexeril 10 Mg  Tabs (Cyclobenzaprine Hcl) .... One By Mouth At Bedtime As Needed 20)  Neurontin 600 Mg Tabs (Gabapentin) .[name].. 1 Every Am, 1 Every Afternoon 2 At Bedtime 21)  Ranexa 500 Mg Xr12h-Tab (Ranolazine) .... Take 1 Tablet By Mouth Two Times A Day 22)  Micardis 40 Mg Tabs (Telmisartan) .... Take 1 Tablet By Mouth Once A Day 23)  Wellbutrin Xl 150 Mg Xr24h-Tab (Bupropion Hcl) .[name].. 1 By Mouth Once Daily in Am 24)  Fish Oil 1000 Mg Caps (Omega-3 Fatty Acids) .... Take 2 Capsules  By Mouth Once A Day 25)  Buspirone Hcl 15 Mg Tabs (Buspirone Hcl) .... One Tablet By Mouth Twice A Day  Allergies: 1)   Tetracycline 2)  Ace Inhibitors 3)  * Eggs  Past History:  Past Medical History: Last updated: 05/11/2010 Allergic rhinitis Anxiety Tobacco abuse       - PFT 11/02/09 FEV1 1.85(95%), FEV1% 82, TLC 3.98 (101), DLCO 59%, no BD Coronary artery disease- CABG CVA  Depression Diabetes mellitus, type II GERD / erosive esophagitis Hyperlipidemia Hypertension Obstuctive Sleep Apnea      - PSG 04/10/06 RDI 105      - CPAP 9 cm H2O Hyperplastic colon polyps Internal hemorrhoids Goiter with thyroid nodules- watched by Dr Talmage Nap   [facility]- [place]  endo- [facility]  pulm- [facility] - Dr Pennie Rushing  Review of Systems       per HPI  Physical Exam  General:  overweight, appears older than stated age Head:  normocephalic, atraumatic, and no abnormalities observed.   Eyes:  vision grossly intact, pupils equal, pupils round, and pupils reactive to light.  no conjunctival pallor, injection or icterus  Ears:  R ear normal and L ear normal.   Nose:  no nasal discharge.   Mouth:  pharynx pink and moist.   Neck:  supple with full rom and no masses or thyromegally, no JVD or carotid bruit  Lungs:  distant bs.  mild crackles/wheezing throughout  Heart:  2/6 SEM Abdomen:  abdomen soft and non-tender without masses, organomegaly or hernias noted. Pulses:  2+ rad pulses Extremities:  no edema Neurologic:  CN 2-12 intact, sensation and strength intact.  gait intact.  Does get dizzy intermittently during exam, no nystagmus noted during entire exam.  dix hallpike negative.  normal bilateral finger to nose, no dysdiadokokinesia  + romberg - falls backward when eyes closed   Impression & Recommendations:  Problem # 1:  DIZZINESS (ICD-780.4) some vertigo, some presyncope, some imbalance.  seems mainly imbalance.  New onset.  Pt with h/o DM, HTN, HLD, CAD, CVA, carotid stenosis.  High risk patient.  Currently on plavix and ASA 325mg  as well as multiple cardiac, DM meds.  Send for head CT  r/o CVA, obtain basic blood work today (last checked 05/2010).  EKG today.  rec find driver to CT scan, no driving while dizzy.  EKG - NSR at 84, normal axis,  intervals, no hypertrophy.  old inferior infarct.  + p mitrale  Her updated medication list for this problem includes:    Clarinex 5 Mg Tabs (Desloratadine) .[name]... 1 by mouth once daily  Orders: TLB-BMP (Basic Metabolic Panel-BMET) (80048-METABOL) TLB-CBC Platelet - w/Differential (85025-CBCD) TLB-Hepatic/Liver Function Pnl (80076-HEPATIC) TLB-TSH (Thyroid Stimulating Hormone) (84443-TSH) EKG w/ Interpretation (93000) Radiology Referral (Radiology)  Problem # 2:  TOBACCO USE (ICD-305.1)  Encouraged smoking cessation.  needs to quit as high risk paitent.  discussed this.  pt will buy patches and gum and has E cigarrette at home to start.  Orders: Radiology Referral (Radiology)  Complete Medication List: 1)  Spiriva Handihaler 18 Mcg Caps (Tiotropium bromide monohydrate) .... One puff once daily 2)  Singulair 10 Mg Tabs (Montelukast sodium) .... Take one by mouth daily 3)  Albuterol Sulfate (2.5 Mg/30ml) 0.083% Nebu (Albuterol sulfate) .... Use one vial in neb every 4 hours as needed 4)  Ventolin Hfa 108 (90 Base) Mcg/act Aers (Albuterol sulfate) .... 2 puffs up to every 4 hours as needed wheeze 5)  Flonase 50 Mcg/act Susp (Fluticasone propionate) .... One spray once daily 6)  Clarinex 5 Mg Tabs (Desloratadine) .[name].. 1 by mouth once daily 7)  Hydrochlorothiazide 50 Mg Tabs (Hydrochlorothiazide) .... Take one by mouth daily 8)  Potassium Chloride Cr 10 Meq Cpcr (Potassium chloride) .... Take one by mouth two times a day 9)  Crestor 10 Mg Tabs (Rosuvastatin calcium) .... Take one by mouth daily 10)  Janumet 50-1000 Mg Tabs (Sitagliptin-metformin hcl) .... Take 1 tablet by mouth two times a day 11)  Plavix 75 Mg Tabs (Clopidogrel bisulfate) .... Take one by mouth daily 12)  Imdur 60 Mg Xr24h-tab (Isosorbide mononitrate) .... Take by  mouth two times a day 13)  Aspirin 325 Mg Tabs (Aspirin) .... Take one by mouth daily with food 14)  Amaryl 2 Mg Tabs (Glimepiride) .... 2 every am and 1 every pm 15)  Mucinex 600 Mg Tb12 (Guaifenesin) .... 2 by mouth two times a day 16)  Omeprazole 20 Mg Cpdr (Omeprazole) .[name].. 1 twice a day 30 minutes before meals. 17)  Carvedilol 12.5 Mg Tabs (Carvedilol) .... Take 1 tablet by mouth two times a day 18)  Nitrolingual 0.4 Mg/spray Tl Soln (Nitroglycerin) .... Use as directed 19)  Flexeril 10 Mg Tabs (Cyclobenzaprine hcl) .... One by mouth at bedtime as needed 20)  Neurontin 600 Mg Tabs (Gabapentin) .[name].. 1 every am, 1 every afternoon 2 at bedtime 21)  Ranexa 500 Mg Xr12h-tab (Ranolazine) .... Take 1 tablet by mouth two times a day 22)  Micardis 40 Mg Tabs (Telmisartan) .... Take 1 tablet by mouth once a day 23)  Wellbutrin Xl 150 Mg Xr24h-tab (Bupropion hcl) .[name].. 1 by mouth once daily in am 24)  Fish Oil 1000 Mg Caps (Omega-3 fatty acids) .... Take 2 capsules  by mouth once a day 25)  Buspirone Hcl 15 Mg Tabs (Buspirone hcl) .... One tablet by mouth twice a day 26)  Tramadol Hcl 50 Mg Tabs (Tramadol hcl) .... One two times a day  Patient Instructions: 1)  Keep your appointment with Dr. Milinda Antis next week. 2)  Decrease neurontin to 1 pill three times a day. 3)  Check sugars when dizzy. 4)  Blood work today.   5)  Head CT scheduled for later today.  pass by Marion's office for appointment. 6)  Good to see you today.  Call clinic with questions.   Orders Added: 1)  TLB-BMP (Basic Metabolic  Panel-BMET) [80048-METABOL] 2)  TLB-CBC Platelet - w/Differential [85025-CBCD] 3)  TLB-Hepatic/Liver Function Pnl [80076-HEPATIC] 4)  TLB-TSH (Thyroid Stimulating Hormone) [84443-TSH] 5)  Est. Patient Level IV [21308] 6)  EKG w/ Interpretation [93000] 7)  Radiology Referral [Radiology]    Current Allergies (reviewed today): TETRACYCLINE ACE INHIBITORS * EGGS  Appended Document: DIZZINESS AND  ELEVATED BP PER DR TOWER/RI     Clinical Lists Changes  Problems: Assessed HYPERTENSION as comment only - elevated readings likely from acute episodes of dizziness as currently well controlled in regimen below. Her updated medication list for this problem includes:    Hydrochlorothiazide 50 Mg Tabs (Hydrochlorothiazide) .[name]... Take one by mouth daily    Carvedilol 12.5 Mg Tabs (Carvedilol) .[name]... Take 1 tablet by mouth two times a day    Micardis 40 Mg Tabs (Telmisartan) .[name]... Take 1 tablet by mouth once a day  Prior BP: 128/70 (07/26/2010)  Labs Reviewed: K+: 4.9 (05/11/2010) Creat: : 0.6 (05/11/2010)   Chol: 125 (05/11/2010)   HDL: 42.60 (05/11/2010)   LDL: 59 (05/11/2010)   TG: 118.0 (05/11/2010)         Impression & Recommendations:  Problem # 1:  HYPERTENSION (ICD-401.9) elevated readings likely from acute episodes of dizziness as currently well controlled in regimen below. Her updated medication list for this problem includes:    Hydrochlorothiazide 50 Mg Tabs (Hydrochlorothiazide) .[name]... Take one by mouth daily    Carvedilol 12.5 Mg Tabs (Carvedilol) .[name]... Take 1 tablet by mouth two times a day    Micardis 40 Mg Tabs (Telmisartan) .[name]... Take 1 tablet by mouth once a day  Prior BP: 128/70 (07/26/2010)  Labs Reviewed: K+: 4.9 (05/11/2010) Creat: : 0.6 (05/11/2010)   Chol: 125 (05/11/2010)   HDL: 42.60 (05/11/2010)   LDL: 59 (05/11/2010)   TG: 118.0 (05/11/2010)  Complete Medication List: 1)  Spiriva Handihaler 18 Mcg Caps (Tiotropium bromide monohydrate) .... One puff once daily 2)  Singulair 10 Mg Tabs (Montelukast sodium) .... Take one by mouth daily 3)  Albuterol Sulfate (2.5 Mg/73ml) 0.083% Nebu (Albuterol sulfate) .... Use one vial in neb every 4 hours as needed 4)  Ventolin Hfa 108 (90 Base) Mcg/act Aers (Albuterol sulfate) .... 2 puffs up to every 4 hours as needed wheeze 5)  Flonase 50 Mcg/act Susp (Fluticasone propionate) .... One spray once daily 6)   Clarinex 5 Mg Tabs (Desloratadine) .[name].. 1 by mouth once daily 7)  Hydrochlorothiazide 50 Mg Tabs (Hydrochlorothiazide) .... Take one by mouth daily 8)  Potassium Chloride Cr 10 Meq Cpcr (Potassium chloride) .... Take one by mouth two times a day 9)  Crestor 10 Mg Tabs (Rosuvastatin calcium) .... Take one by mouth daily 10)  Janumet 50-1000 Mg Tabs (Sitagliptin-metformin hcl) .... Take 1 tablet by mouth two times a day 11)  Plavix 75 Mg Tabs (Clopidogrel bisulfate) .... Take one by mouth daily 12)  Imdur 60 Mg Xr24h-tab (Isosorbide mononitrate) .... Take by mouth two times a day 13)  Aspirin 325 Mg Tabs (Aspirin) .... Take one by mouth daily with food 14)  Amaryl 2 Mg Tabs (Glimepiride) .... 2 every am and 1 every pm 15)  Mucinex 600 Mg Tb12 (Guaifenesin) .... 2 by mouth two times a day 16)  Omeprazole 20 Mg Cpdr (Omeprazole) .[name].. 1 twice a day 30 minutes before meals. 17)  Carvedilol 12.5 Mg Tabs (Carvedilol) .... Take 1 tablet by mouth two times a day 18)  Nitrolingual 0.4 Mg/spray Tl Soln (Nitroglycerin) .... Use as directed 19)  Flexeril  10 Mg Tabs (Cyclobenzaprine hcl) .... One by mouth at bedtime as needed 20)  Neurontin 600 Mg Tabs (Gabapentin) .[name].. 1 every am, 1 every afternoon 2 at bedtime 21)  Ranexa 500 Mg Xr12h-tab (Ranolazine) .... Take 1 tablet by mouth two times a day 22)  Micardis 40 Mg Tabs (Telmisartan) .... Take 1 tablet by mouth once a day 23)  Wellbutrin Xl 150 Mg Xr24h-tab (Bupropion hcl) .[name].. 1 by mouth once daily in am 24)  Fish Oil 1000 Mg Caps (Omega-3 fatty acids) .... Take 2 capsules  by mouth once a day 25)  Buspirone Hcl 15 Mg Tabs (Buspirone hcl) .... One tablet by mouth twice a day 26)  Tramadol Hcl 50 Mg Tabs (Tramadol hcl) .... One two times a day

## 2010-10-31 NOTE — Assessment & Plan Note (Signed)
Summary: GI office visit  Medications Added IMDUR   TB24 (ISOSORBIDE MONONITRATE TB24) take by mouth two times a day OMEPRAZOLE 20 MG  CPDR (OMEPRAZOLE) 1 twice a day 30 minutes before meals       21  22  23  24  25    Visit Type:  Follow-up Visit PCP:  [name], MD  Chief Complaint:  epigastric pain.  History of Present Illness: This is a 55 year old white female with GERD in the history of erosive esophagitis who relates epigastric pain for the past 3 weeks. She has been taking omeprazole 20 mg b.i.d. on a regular basis. She denies aspirin and NSAID usage. She notes no dysphasia, odynophagia, weight loss, chest pain, change in bowel habits, melena, hematochezia, change in stool caliber or weight loss. Her epigastric pain has improved over the past several days with no specific therapy. She has noted temporary improvement in symptoms with as needed use of TUMS and Pepto-Bismol. She did not complete a screening colonoscopy in 2008 as her cardiologist declined to approve a temporary hold on Plavix due to her drug-eluting cardiac stents.    Updated Prior Medication List: HYDROCHLOROTHIAZIDE 50 MG  TABS (HYDROCHLOROTHIAZIDE) take one by mouth daily DIOVAN 160 MG  TABS (VALSARTAN) take one by mouth daily ADVAIR DISKUS 100-50 MCG/DOSE  MISC (FLUTICASONE-SALMETEROL) use two times a day as directed POTASSIUM CHLORIDE CR 10 MEQ  CPCR (POTASSIUM CHLORIDE) take one by mouth bid SINGULAIR 10 MG  TABS (MONTELUKAST SODIUM) take one by mouth daily CRESTOR 10 MG  TABS (ROSUVASTATIN CALCIUM) take one by mouth daily METFORMIN HCL 1000 MG  TABS (METFORMIN HCL) take one by mouth bid LYRICA 75 MG  CAPS (PREGABALIN) take one by mouth tid LOPRESSOR   TABS (METOPROLOL TARTRATE TABS) take 25 mg by mouth daily PLAVIX 75 MG  TABS (CLOPIDOGREL BISULFATE) take one by mouth daily IMDUR   TB24 (ISOSORBIDE MONONITRATE TB24) take by mouth two times a day ASPIRIN 325 MG  TABS (ASPIRIN) take one by mouth daily  AMARYL 4 MG  TABS (GLIMEPIRIDE) one by mouth qd JANUVIA 100 MG  TABS (SITAGLIPTIN PHOSPHATE) 1po qd MUCINEX 600 MG  TB12 (GUAIFENESIN) 2 by mouth bid OMEPRAZOLE 20 MG  CPDR (OMEPRAZOLE) 1 twice a day 30 minutes before meals  Current Allergies (reviewed today): TETRACYCLINE ACE INHIBITORS * EGGS  Past Medical History:    Reviewed history from 03/26/2007 and no changes required:       Allergic rhinitis       Anxiety       COPD / emphysema       Coronary artery disease- CABG       Depression       Diabetes mellitus, type II       GERD / esophagitis       Hyperlipidemia       Hypertension       Obstuctive Sleep Apnea  Past Surgical History:    Appendectomy    Cholecystectomy    Tonsillectomy    Pelvic US- left ovarian cyst    CT- head- old infarction (11/1997)    Endometrial biopsy- neg (DUB)      Cervical fusion x 3    Left foot surgry- plantar fasciitis    Exp. laparoscopy- intestinal obstruction    Carotid doppler- left stenosis    Dexa- normal (11/2002)    Abd US- echodensity (01/2003)   worse (12/2006)    Left arm fracture (07/2004)    Cardiolite- pos (07/2006)  Cath- stent LCA, OM (08/2006)    EGD - erosive esophagitis (01/2007)     Review of Systems       Review of Systems: The pertinent positives and negative are noted in the HPI. All other ROS were negative.    Vital Signs:  Patient Profile:   55 Years Old Female Height:     58 inches Weight:      168 pounds BMI:     35.24 Pulse rate:   80 / minute BP sitting:   128 / 68             Comments Medications reviewed with patient .................................................................Marland KitchenMarland KitchenDarcey Nora RN  January 15, 2008 11:49 AM      Physical Exam  General:     Well developed, well nourished, no acute distress.Well developed, well nourished, no acute distress. Head:     Normocephalic and atraumatic. Eyes:     no icterus. Ears:     Normal auditory acuity. Nose:     No deformity,  discharge,  or lesions. Mouth:     No deformity or lesions, dentition normal. Neck:     Supple; no masses or thyromegaly. Chest Wall:     Symmetrical;  no deformities or tenderness. Lungs:     Clear throughout to auscultation. Heart:     Regular rate and rhythm; no murmurs, rubs,  or bruits. Abdomen:     Soft  and nondistended. No masses, hepatosplenomegaly or hernias noted. Normal bowel sounds. Mild epigastric tenderness to deep palpation without rebound, guarding or distention. Msk:     Symmetrical with no gross deformities. Normal posture. Pulses:     Normal pulses noted. Extremities:     No clubbing, cyanosis, edema or deformities noted. Neurologic:     Alert and  oriented x4;  grossly normal neurologically. Skin:     Intact without significant lesions or rashes. Cervical Nodes:     No significant axillary, cervical,  or inguinal adenopathy. Psych:     Alert and cooperative. Normal mood and affect.    Impression & Recommendations:  Problem # 1:  ABDOMINAL PAIN-EPIGASTRIC (ICD-789.06) I suspect she has had a flare of GERD. Recommend reintensifing and all anti-reflux measures. Discontinue omeprazole. Begin Nexium 40 mg p.o. q.a.m. Obtain a CBC, lipase and CMET.    Problem # 2:  SPECIAL SCREENING FOR MALIGNANT NEOPLASMS COLON (ICD-V76.51) She is due for colorectal cancer screening. We will schedule colonoscopy if her cardiologist will approve a 5 to 7 day hold on Plavix. The risks benefits and alternatives to colonoscopy were discussed with the patient and she consents to proceed. Additional plans at her return office visit.  Medications Added to Medication List This Visit: 1)  Imdur Tb24 (Isosorbide mononitrate tb24) .... Take by mouth two times a day 2)  Omeprazole 20 Mg Cpdr (Omeprazole) .[name].. 1 twice a day 30 minutes before meals   Patient Instructions: 1)  Please schedule a follow-up appointment in 4 to 6 weeks. 2)  Avoid foods high in acid content (tomatoes, citrus  juices, spicy foods). Avoid eating within two hours of lying down or before exercising. Do not over eat; try smaller more frequent meals. Elevate head of bed twelve inches when sleeping.    ]

## 2010-10-31 NOTE — Letter (Signed)
Summary: [facility]   Imported By: Lanelle Bal 07/11/2010 13:49:01  _____________________________________________________________________  External Attachment:    Type:   Image     Comment:   External Document

## 2010-10-31 NOTE — Progress Notes (Signed)
Summary: 90 day amaryl rx  Phone Note Refill Request Message from:  Fax from Pharmacy on June 21, 2008 3:28 PM  Refills Requested: Medication #1:  AMARYL 4 MG  TABS one by mouth qd   Supply Requested: 3 months [facility]  Initial call taken by: Liane Comber,  June 21, 2008 3:28 PM  Follow-up for Phone Call        px printed out for fax   Follow-up by: Judith Part MD,  June 21, 2008 5:01 PM  Additional Follow-up for Phone Call Additional follow up Details #1::        Rx faxed to pharmacy Additional Follow-up by: Liane Comber,  June 22, 2008 12:07 PM    New/Updated Medications: AMARYL 4 MG  TABS (GLIMEPIRIDE) one by mouth once daily   Prescriptions: AMARYL 4 MG  TABS (GLIMEPIRIDE) one by mouth once daily  #90 x 3   Entered and Authorized by:   Judith Part MD   Signed by:   Judith Part MD on 06/21/2008   Method used:   Printed then faxed to ...         RxID:   [id number]

## 2010-10-31 NOTE — Assessment & Plan Note (Signed)
Summary: DIZZY SPELLS   Vital Signs:  Patient profile:   55 year old female Temp:     98.1 degrees F oral Pulse rate:   84 / minute Pulse rhythm:   regular BP sitting:   106 / 68  (left arm) Cuff size:   regular  Vitals Entered By: Lewanda Rife LPN (August 02, 2010 10:26 AM) CC: dizzy spells, Hypertension Management   History of Present Illness: here for f/u of dizzy spells  pt has hx of pvd/ coronary artery disease and carotid dz  last visit here with Dr Reece Agar-- had CT showing prior infarct (nothing new) cannot have mri due to metal in body nl orthostatics and no change on EKG sugar 193 - other labs nl  he rec she dec neurontin dose   sugar control has been ok - last AIC 7.1   is feeling about the same having dizzy spells 4-5 times per day- last just seconds  feels light headed and very unsteady -- like she may fall  is worse sitting or standing  chaning neurontin made no diff   is trying very hard to quit smoking tried elect cig - works for short time only      Hypertension History:      Positive major cardiovascular risk factors include female age 44 years old or older, diabetes, hyperlipidemia, hypertension, and current tobacco user.        Positive history for target organ damage include ASHD (either angina/prior MI/prior CABG) and prior stroke (or TIA).     Allergies: 1)  Tetracycline 2)  Ace Inhibitors 3)  * Eggs  Past History:  Past Medical History: Last updated: 05/11/2010 Allergic rhinitis Anxiety Tobacco abuse       - PFT 11/02/09 FEV1 1.85(95%), FEV1% 82, TLC 3.98 (101), DLCO 59%, no BD Coronary artery disease- CABG CVA  Depression Diabetes mellitus, type II GERD / erosive esophagitis Hyperlipidemia Hypertension Obstuctive Sleep Apnea      - PSG 04/10/06 RDI 105      - CPAP 9 cm H2O Hyperplastic colon polyps Internal hemorrhoids Goiter with thyroid nodules- watched by Dr Talmage Nap   [facility]acinth [facility]- [place]  endo- [facility]  pulm-  [facility] - Dr Pennie Rushing  Past Surgical History: Last updated: 07/31/2010 Appendectomy Cholecystectomy Tonsillectomy Endometrial biopsy- neg (DUB)   Cervical fusion x 3 Left foot surgry- plantar fasciitis Exp. laparoscopy- intestinal obstruction Left arm fracture (07/2004)  Carotid doppler- left stenosis Dexa- normal (11/2002) Abd US- echodensity (01/2003)   worse (12/2006) Cardiolite- pos (07/2006) Cath- stent LCA, OM (08/2006) EGD - erosive esophagitis (01/2007) colonoscopy (6/09)- hyperplastic polyp and hemorrhoids Pelvic US- left ovarian cyst CT- head- old infarction (11/1997) 10/11 carotid doppler 60-79    bilat - yearly f/u recommended   Family History: Last updated: 11/15/2009 Father: MI's, HTN, skin cancer, died of ca - lung/liver/adrenal -- was a smoker  Mother: MI's, ? CVA's Siblings: 1 sister with HTN No FH of Colon Cancer:  Social History: Last updated: 11/22/2008 Marital Status: Married Children: none Occupation: disability Current Smoker  Risk Factors: Alcohol Use: 0 (07/13/2010)  Risk Factors: Smoking Status: current (07/13/2010) Packs/Day: 1.5 (07/13/2010)  Review of Systems General:  Denies chills, fatigue, fever, loss of appetite, and malaise. Eyes:  Denies blurring, eye irritation, and eye pain. ENT:  Denies earache, ringing in ears, sinus pressure, and sore throat. CV:  Complains of shortness of breath with exertion; denies chest pain or discomfort and palpitations. Resp:  Denies cough, shortness of  breath, and wheezing. GI:  Denies abdominal pain, bloody stools, and change in bowel habits. GU:  Denies dysuria and urinary frequency. Derm:  Denies itching, poor wound healing, and rash. Neuro:  Complains of poor balance; denies difficulty with concentration, disturbances in coordination, falling down, headaches, memory loss, numbness, tingling, tremors, and visual disturbances. Psych:  mood is fair . Endo:  Denies cold intolerance and  heat intolerance. Heme:  Denies abnormal bruising and bleeding.  Physical Exam  General:  overweight, appears older than stated age Head:  normocephalic, atraumatic, and no abnormalities observed.  no facial or temporal tenderness Eyes:  2 beats of horiz nystagmus to L only vision grossly intact, pupils equal, pupils round, and pupils reactive to light.   Ears:  cerumen impaction bilaterally Nose:  no nasal discharge.   Mouth:  pharynx pink and moist, no erythema, and no exudates.   Neck:  supple with full rom and no masses or thyromegally, no JVD or carotid bruit  Lungs:  diffusely distant bs  no rales or rhonchi  Heart:  2/6 SEM Abdomen:  soft and non-tender.   Msk:  No deformity or scoliosis noted of thoracic or lumbar spine.   Pulses:  2+ rad pulses Extremities:  no edema Neurologic:  backward drift on rhomberg-- and trouble with tandem gait (baseline since cva in 07)cranial nerves II-XII intact, strength normal in all extremities, sensation intact to light touch, DTRs symmetrical and normal, and toes down bilaterally on Babinski.   Skin:  Intact without suspicious lesions or rashes Cervical Nodes:  No lymphadenopathy noted Psych:  normal affect, talkative and pleasant    Impression & Recommendations:  Problem # 1:  DIZZINESS (ICD-780.4) Assessment Unchanged possibly multifactorial with neg w/u so far unfortunately cannot do mri  hx of cva and carotid occlusion- is on asa and plavix disc safety ref to neurology for further eval  only change in exam is 2 beats of horiz nystagmus to L only Her updated medication list for this problem includes:    Clarinex 5 Mg Tabs (Desloratadine) .[name]... 1 by mouth once daily  Orders: Neurology Referral (Neuro)  Problem # 2:  CERUMEN IMPACTION, BILATERAL (ICD-380.4) Assessment: New this is mild and likely unrel to above  did rec debrox will not flush ears until dizziness improves at this point   Problem # 3:  TOBACCO USE  (ICD-305.1) Assessment: Unchanged will keep working  hard on cessation  Complete Medication List: 1)  Spiriva Handihaler 18 Mcg Caps (Tiotropium bromide monohydrate) .... One puff once daily 2)  Singulair 10 Mg Tabs (Montelukast sodium) .... Take one by mouth daily 3)  Albuterol Sulfate (2.5 Mg/40ml) 0.083% Nebu (Albuterol sulfate) .... Use one vial in neb every 4 hours as needed 4)  Ventolin Hfa 108 (90 Base) Mcg/act Aers (Albuterol sulfate) .... 2 puffs up to every 4 hours as needed wheeze 5)  Flonase 50 Mcg/act Susp (Fluticasone propionate) .... Two sprays  once daily 6)  Clarinex 5 Mg Tabs (Desloratadine) .[name].. 1 by mouth once daily 7)  Hydrochlorothiazide 50 Mg Tabs (Hydrochlorothiazide) .... Take one by mouth daily 8)  Potassium Chloride Cr 10 Meq Cpcr (Potassium chloride) .... Take one by mouth two times a day 9)  Crestor 10 Mg Tabs (Rosuvastatin calcium) .... Take one by mouth daily 10)  Janumet 50-1000 Mg Tabs (Sitagliptin-metformin hcl) .... Take 1 tablet by mouth two times a day 11)  Plavix 75 Mg Tabs (Clopidogrel bisulfate) .... Take one by mouth daily 12)  Imdur 60  Mg Xr24h-tab (Isosorbide mononitrate) .... Take by mouth two times a day 13)  Aspirin 325 Mg Tabs (Aspirin) .... Take one by mouth daily with food 14)  Amaryl 2 Mg Tabs (Glimepiride) .... 2 every am and 1 every pm 15)  Mucinex 600 Mg Tb12 (Guaifenesin) .... 2 by mouth two times a day 16)  Omeprazole 20 Mg Cpdr (Omeprazole) .[name].. 1 twice a day 30 minutes before meals. 17)  Carvedilol 12.5 Mg Tabs (Carvedilol) .... Take 1 tablet by mouth two times a day 18)  Nitrolingual 0.4 Mg/spray Tl Soln (Nitroglycerin) .... Use as directed 19)  Flexeril 10 Mg Tabs (Cyclobenzaprine hcl) .... One by mouth at bedtime as needed 20)  Neurontin 600 Mg Tabs (Gabapentin) .[name].. 1 every am, 1 every afternoon 1 at bedtime 21)  Ranexa 500 Mg Xr12h-tab (Ranolazine) .... Take 1 tablet by mouth two times a day 22)  Micardis 40 Mg Tabs (Telmisartan)  .... Take 1 tablet by mouth once a day 23)  Fish Oil 1000 Mg Caps (Omega-3 fatty acids) .... Take 2 capsules  by mouth once a day 24)  Buspirone Hcl 15 Mg Tabs (Buspirone hcl) .... One tablet by mouth twice a day 25)  Tramadol Hcl 50 Mg Tabs (Tramadol hcl) .... One two times a day 26)  Wellbutrin Xl 300 Mg Xr24h-tab (Bupropion hcl) .... Take 1 tablet by mouth once a day 27)  Debrox 6.5 % Soln (Carbamide peroxide) .... Use as directed on bottle twice weekly to help ear wax 28)  Caltrate 600+d Plus 600-400 Mg-unit Tabs (Calcium carbonate-vit d-min) .[name].. 1 by mouth two times a day  Hypertension Assessment/Plan:      The patient's hypertensive risk group is category C: Target organ damage and/or diabetes.  Today's blood pressure is 106/68.  Her blood pressure goal is < 130/80.  Patient Instructions: 1)  we will do neurology referral at check out  2)  if dizziness worsens - let me know  3)  try debrox for ear wax  Prescriptions: CALTRATE 600+D PLUS 600-400 MG-UNIT TABS (CALCIUM CARBONATE-VIT D-MIN) 1 by mouth two times a day  #180 x 3   Entered and Authorized by:   Judith Part MD   Signed by:   Judith Part MD on 08/02/2010   Method used:   Print then Give to Patient   RxID:   [id number] FISH OIL 1000 MG CAPS (OMEGA-3 FATTY ACIDS) Take 2 capsules  by mouth once a day  #180 x 3   Entered and Authorized by:   Judith Part MD   Signed by:   Judith Part MD on 08/02/2010   Method used:   Print then Give to Patient   RxID:   [id number] MUCINEX 600 MG  TB12 (GUAIFENESIN) 2 by mouth two times a day  #3 months x 3   Entered and Authorized by:   Judith Part MD   Signed by:   Judith Part MD on 08/02/2010   Method used:   Print then Give to Patient   RxID:   [id number] ASPIRIN 325 MG  TABS (ASPIRIN) take one by mouth daily with food  #90 x 3   Entered and Authorized by:   Judith Part MD   Signed by:   Judith Part MD on 08/02/2010   Method used:   Print  then Give to Patient   RxID:   [id number] DEBROX 6.5 % SOLN (CARBAMIDE PEROXIDE) use as directed on bottle twice weekly  to help ear wax  #1 bottle x 11   Entered and Authorized by:   Judith Part MD   Signed by:   Judith Part MD on 08/02/2010   Method used:   Print then Give to Patient   RxID:   [id number]    Orders Added: 1)  Neurology Referral [Neuro] 2)  Est. Patient Level IV [95621]    Current Allergies (reviewed today): TETRACYCLINE ACE INHIBITORS * EGGS

## 2010-10-31 NOTE — Consult Note (Signed)
Summary: [facility]  [facility]   Imported By: Lester Paul 06/15/2010 13:39:38  _____________________________________________________________________  External Attachment:    Type:   Image     Comment:   External Document

## 2010-10-31 NOTE — Progress Notes (Signed)
Summary: form for [facility]  Phone Note Other Incoming   Call placed by: [facility]  Summary of Call: form for refills lists glucotro, pt states she no longer takes this , takes amaryl 4 mg once daily instead, form is on your shelf Initial call taken by: Lowella Petties,  January 21, 2008 11:02 AM  Follow-up for Phone Call        form done and EMR updated Follow-up by: Judith Part MD,  January 21, 2008 1:43 PM  Additional Follow-up for Phone Call Additional follow up Details #1::        [facility]ed to [facility] Additional Follow-up by: Lowella Petties,  January 21, 2008 2:34 PM    New/Updated Medications: CLARINEX 5 MG  TABS (DESLORATADINE) 1 by mouth once daily prn

## 2010-10-31 NOTE — Assessment & Plan Note (Signed)
Summary: CPAP F/U- WAS PT OF DR G ///kp   Copy to:    Primary Provider/Referring Provider:  [name], MD  CC:  Pulmonary consult. The patient c/o increased sob mostly with exertion. She does have some shortness of breath while at rest on occasion.Diane [name]  History of Present Illness: 55 yo female for evaluation of COPD/Dyspnea and OSA.  She is a former patient of Dr. Jayme Cloud.  She has a history of OSA.  PSG from April 10, 2006 showed RDI of 105.  She has been on CPAP 15 cm.  She stopped using CPAP about 6 months ago to do events at home.  She was using a full face mask.  She felt that CPAP helped her sleep and energy during the day.  She did not have any problems with her set up.  She uses [facility] for her DME.  She has not had any recent chest xrays or PFTs.  She continues to smoke 1.5 packs per day.  She has tried wellbutrin, chantix, and nicotine replacement without success.    She gets short of breath with exertion.  She has clear sinus drainage.  She uses mucinex once daily.  She has cough with cloudy sputum.  She denies hemoptysis.  She does get wheezing, and this improves after she uses her inhaler.  She uses ventolin several times per week.  She denies fever, and her weight has been steady.  She gets ankle swelling, but this is no worse than usual.  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  She had a pet dog.  She also has a pet bird since 60.    CXR  Procedure date:  10/14/2009  Findings:      Findings: There are stable surgical changes related to triple bypass surgery.  The heart is upper limits of normal and the mediastinal and hilar contours are within normal limits.  There are chronic bronchitic type lung changes but no acute pulmonary findings.  The bony thorax is intact.   IMPRESSION: Chronic lung changes but no acute pulmonary findings.   Preventive Screening-Counseling & Management  Alcohol-Tobacco  Alcohol drinks/day: 0     Smoking Status: current     Packs/Day: 1.5  Current Medications (verified): 1)  Hydrochlorothiazide 50 Mg  Tabs (Hydrochlorothiazide) .... Take One By Mouth Daily 2)  Potassium Chloride Cr 10 Meq  Cpcr (Potassium Chloride) .... Take One By Mouth Bid 3)  Singulair 10 Mg  Tabs (Montelukast Sodium) .... Take One By Mouth Daily 4)  Crestor 10 Mg  Tabs (Rosuvastatin Calcium) .... Take One By Mouth Daily 5)  Janumet 50-1000 Mg Tabs (Sitagliptin-Metformin Hcl) .... Take 1 Tablet By Mouth Two Times A Day 6)  Plavix 75 Mg  Tabs (Clopidogrel Bisulfate) .... Take One By Mouth Daily 7)  Imdur 60 Mg  Xr24h-Tab (Isosorbide Mononitrate) .... Take By Mouth Two Times A Day 8)  Aspirin 325 Mg  Tabs (Aspirin) .... Take One By Mouth Daily With Food 9)  Amaryl 4 Mg  Tabs (Glimepiride) .... One By Mouth Once Daily 10)  Mucinex 600 Mg  Tb12 (Guaifenesin) .... 2 By Mouth Two Times A Day As Needed 11)  Omeprazole 20 Mg  Cpdr (Omeprazole) .Diane [name].. 1 Twice A Day 30 Minutes Before Meals. 12)  Carvedilol 3.125 Mg  Tabs (Carvedilol) .... One By Mouth Two Times A Day 13)  Nitrolingual 0.4 Mg/spray Tl Soln (Nitroglycerin) .... Use As Directed 14)  Propoxyphene  N-Apap 100-650 Mg  Tabs (Propoxyphene N-Apap) .... One By Mouth Two Times A Day As Needed 15)  Flexeril 10 Mg  Tabs (Cyclobenzaprine Hcl) .... One By Mouth At Bedtime 16)  Ventolin Hfa 108 (90 Base) Mcg/act  Aers (Albuterol Sulfate) .... 2 Puffs Up To Every 4 Hours As Needed Wheeze 17)  Albuterol Sulfate (2.5 Mg/68ml) 0.083% Nebu (Albuterol Sulfate) .... Use One Vial in [place] Every 4 Hours As Needed 18)  Neurontin 600 Mg Tabs (Gabapentin) .... As Directed 19)  Clarinex 5 Mg Tabs (Desloratadine) .Diane [name].. 1 By Mouth Once Daily As Needed Allergy Symptoms 20)  Ranexa 500 Mg Xr12h-Tab (Ranolazine) .... Take 1 Tablet By Mouth Two Times A Day 21)  Micardis 40 Mg Tabs (Telmisartan) .... Take 1 Tablet By Mouth Once A Day  Allergies (verified): 1)   Tetracycline 2)  Ace Inhibitors 3)  * Eggs  Past History:  Past Medical History: Allergic rhinitis Anxiety Tobacco abuse  COPD / emphysema Coronary artery disease- CABG CVA  Depression Diabetes mellitus, type II GERD / erosive esophagitis Hyperlipidemia Hypertension Obstuctive Sleep Apnea Hyperplastic colon polyps Internal hemorrhoids  opthy- [facility]- [place]  endo- Balan  pulm- [facility]  Past Surgical History: Reviewed history from 06/14/2009 and no changes required. Appendectomy Cholecystectomy Tonsillectomy Endometrial biopsy- neg (DUB)   Cervical fusion x 3 Left foot surgry- plantar fasciitis Exp. laparoscopy- intestinal obstruction Left arm fracture (07/2004)  Carotid doppler- left stenosis Dexa- normal (11/2002) Abd US- echodensity (01/2003)   worse (12/2006) Cardiolite- pos (07/2006) Cath- stent LCA, OM (08/2006) EGD - erosive esophagitis (01/2007) colonoscopy (6/09)- hyperplastic polyp and hemorrhoids Pelvic US- left ovarian cyst CT- head- old infarction (11/1997)  Family History: Reviewed history from 06/14/2009 and no changes required. Father: MI's, HTN, skin cancer Mother: MI's, ? CVA's Siblings: 1 sister with HTN No FH of Colon Cancer:  Social History: Reviewed history from 11/22/2008 and no changes required. Marital Status: Married Children: none Occupation: disability Current Smoker Packs/Day:  1.5 Alcohol drinks/day:  0  Vital Signs:  Patient profile:   55 year old female Height:      57 inches (144.78 cm) Weight:      169.38 pounds (76.99 kg) BMI:     36.79 O2 Sat:      97 % on Room air Temp:     98.0 degrees F (36.67 degrees C) oral Pulse rate:   103 / minute BP sitting:   124 / 80  (left arm) Cuff size:   regular  Vitals Entered By: Michel Bickers CMA (October 14, 2009 2:08 PM)  O2 Flow:  Room air CC: Pulmonary consult. The patient c/o increased sob mostly with exertion. She does have some shortness of breath while at  rest on occasion. Is Patient Diabetic? Yes   Physical Exam  General:  obese.   Eyes:  PERRLA and EOMI, wears glasses Nose:  clear nasal discharge.   Mouth:  MP 3, no oral lesions Neck:  no JVD.   Chest Wall:  no deformities noted Lungs:  diminished breath sounds, no wheezing or rales Heart:  Regular rate and rhythm; no murmurs, rubs,  or bruits. Abdomen:  soft, non-tender, normal bowel sounds, no distention, no masses, no hepatomegaly, and no splenomegaly.  no renal bruits Msk:  No deformity or scoliosis noted of thoracic or lumbar spine. Extremities:  no clubbing, cyanosis, edema, or deformity noted Neurologic:  no focal deficits Cervical Nodes:  No lymphadenopathy noted Psych:  Alert and cooperative. Normal mood and affect.  Pulmonary Function Test Date: 10/14/2009 2:35 PM Gender: Female  Pre-Spirometry FVC    Value: 1.95 L/min   % Pred: 69.40 % FEV1    Value: 1.68 L     Pred: 2.20 L     % Pred: 76.20 % FEV1/FVC  Value: 86.11 %     % Pred: 108.80 %  Impression & Recommendations:  Problem # 1:  DYSPNEA (ICD-786.05) She has a diagnosis of COPD and extensive history of smoking.  She has a history of heart disease.  She also has exposure to a bird.  She likely also has a component of deconditioning.  Her spirometry is more suggestive of a restrictive defect.    To further assess this I will arrange for her to have full pulmonary function testing.  I will also add spiriva.  She is to continue as needed ventolin.    Problem # 2:  OBSTRUCTIVE SLEEP APNEA (ICD-327.23) Reviewed her previous sleep test results.  Discussed how sleep apnea can affect her health.  Explained the importance or weight loss, and driving precautions were reviewed.  Will have her restart CPAP, and get a download from her machine.  Depending on the results of this will determine if adjustments in her set up are needed.  Problem # 3:  TOBACCO USE (ICD-305.1) Explained that unless she stops smoking she will  always have trouble with her breathing.  Medications Added to Medication List This Visit: 1)  Spiriva Handihaler 18 Mcg Caps (Tiotropium bromide monohydrate) .... One puff once daily  Complete Medication List: 1)  Clarinex 5 Mg Tabs (Desloratadine) .Diane [name].. 1 by mouth once daily as needed allergy symptoms 2)  Albuterol Sulfate (2.5 Mg/49ml) 0.083% Nebu (Albuterol sulfate) .... Use one vial in neb every 4 hours as needed 3)  Singulair 10 Mg Tabs (Montelukast sodium) .... Take one by mouth daily 4)  Ventolin Hfa 108 (90 Base) Mcg/act Aers (Albuterol sulfate) .... 2 puffs up to every 4 hours as needed wheeze 5)  Hydrochlorothiazide 50 Mg Tabs (Hydrochlorothiazide) .... Take one by mouth daily 6)  Potassium Chloride Cr 10 Meq Cpcr (Potassium chloride) .... Take one by mouth bid 7)  Crestor 10 Mg Tabs (Rosuvastatin calcium) .... Take one by mouth daily 8)  Janumet 50-1000 Mg Tabs (Sitagliptin-metformin hcl) .... Take 1 tablet by mouth two times a day 9)  Plavix 75 Mg Tabs (Clopidogrel bisulfate) .... Take one by mouth daily 10)  Imdur 60 Mg Xr24h-tab (Isosorbide mononitrate) .... Take by mouth two times a day 11)  Aspirin 325 Mg Tabs (Aspirin) .... Take one by mouth daily with food 12)  Amaryl 4 Mg Tabs (Glimepiride) .... One by mouth once daily 13)  Mucinex 600 Mg Tb12 (Guaifenesin) .... 2 by mouth two times a day as needed 14)  Omeprazole 20 Mg Cpdr (Omeprazole) .Diane [name].. 1 twice a day 30 minutes before meals. 15)  Carvedilol 3.125 Mg Tabs (Carvedilol) .... One by mouth two times a day 16)  Nitrolingual 0.4 Mg/spray Tl Soln (Nitroglycerin) .... Use as directed 17)  Propoxyphene N-apap 100-650 Mg Tabs (Propoxyphene n-apap) .... One by mouth two times a day as needed 18)  Flexeril 10 Mg Tabs (Cyclobenzaprine hcl) .... One by mouth at bedtime 19)  Neurontin 600 Mg Tabs (Gabapentin) .... As directed 20)  Ranexa 500 Mg Xr12h-tab (Ranolazine) .... Take 1 tablet by mouth two times a day 21)  Micardis 40 Mg  Tabs (Telmisartan) .... Take 1 tablet by mouth once a day 22)  Spiriva Handihaler  18 Mcg Caps (Tiotropium bromide monohydrate) .... One puff once daily  Other Orders: Spirometry w/Graph (94010) New Patient Level IV (16109) Full Pulmonary Function Test (PFT) DME Referral (DME) T-2 View CXR (71020TC)  Patient Instructions: 1)  Spiriva one puff once daily  2)  Continue ventolin 2 puffs up to 4 times per day as needed for cough, wheezing, or chest congestion 3)  Will arrange for breathing test (PFT) 4)  Will get report from CPAP machine 5)  Follow up in 2 to 3 weeks Prescriptions: SPIRIVA HANDIHALER 18 MCG CAPS (TIOTROPIUM BROMIDE MONOHYDRATE) one puff once daily  #30 x 3   Entered and Authorized by:   Coralyn Helling MD   Signed by:   Coralyn Helling MD on 10/14/2009   Method used:   Electronically to        [facility] [address] [phone]* (retail)       [address]       [place], [place]  [address]       Ph: [phone] or [phone]       Fax: [phone]   RxID:   [id number]    [employer]Perfect Spirometry  ID: [id number] Patient: ISAMAR, NAZIR DOB: Feb 03, 1956 Age: 55 Years Old Sex: Female Race: White Physician: Colon Flattery Tower MD Height: 57 Weight: 169.38 PPD: 1.5 Status: Confirmed Past Medical History:  Allergic rhinitis Anxiety Tobacco abuse  COPD / emphysema Coronary artery disease- CABG CVA  Depression Diabetes mellitus, type II GERD / erosive esophagitis Hyperlipidemia Hypertension Obstuctive Sleep Apnea Hyperplastic colon polyps Internal hemorrhoids  opthy- [facility]- [place]  endo- [facility]  psych Recorded: 10/14/2009 2:35 PM  Parameter  Measured Predicted %Predicted FVC     1.95        2.81        69.40 FEV1     1.68        2.20        76.20 FEV1%   86.11        79.12        108.80 PEF    4.87        5.75        84.60   Interpretation: Pre: FVC= 1.95L FEV1= 1.68L FEV1%= 86.1% 1.68/1.95 FEV1/FVC (10/14/2009 2:37:31 PM),  Moderate restriction  Suggestive of restrictive defect

## 2010-10-31 NOTE — Assessment & Plan Note (Signed)
Summary: ROA 6 MTHS CYD   Vital Signs:  Patient profile:   55 year old female Height:      57 inches Weight:      170 pounds BMI:     36.92 Temp:     98 degrees F oral Pulse rate:   96 / minute Pulse rhythm:   regular BP sitting:   130 / 74  (left arm) Cuff size:   regular  Vitals Entered By: Lewanda Rife LPN (November 15, 2009 9:27 AM)  History of Present Illness: here for f/u of chronic med problems  has been feeling ok overall   was a little achey from fibro last night   wt is up 1 lb with bmi of 36  bp is 130/74    TAb abuse-- not doing good - but tries to quit often  is smoking 1-2 pk per day  is following cxr and CT -- and PFTs and all is stable  copd-- no worse and no better   DM 2- sees Dr Talmage Nap-- is doing ok overall/ ? last AIC -- has f/u in 6 month  is doing well with her diet overall  most sugars are below 150  is getting walking for exercise -- about 20 min per day   opthy-- is due / goes to Dr Hyacinth Meeker - she will make her own appt   lpids - last check in summer LDL 46 very well controlled  ptx up to date ? flu--- cannot get it because of egg allergy  Td 98 depression is worse -- lost her dog in october , and father dx with cancer and died  is grieving -- cries occas  is having trouble sleeping  now her mother is more needy and calling a lot  also taking care of sick aunt   needs mammogram  no changes on self exam  needs to fl/u for gyn visit    Allergies: 1)  Tetracycline 2)  Ace Inhibitors 3)  * Eggs  Past History:  Past Surgical History: Last updated: 06/14/2009 Appendectomy Cholecystectomy Tonsillectomy Endometrial biopsy- neg (DUB)   Cervical fusion x 3 Left foot surgry- plantar fasciitis Exp. laparoscopy- intestinal obstruction Left arm fracture (07/2004)  Carotid doppler- left stenosis Dexa- normal (11/2002) Abd US- echodensity (01/2003)   worse (12/2006) Cardiolite- pos (07/2006) Cath- stent LCA, OM (08/2006) EGD -  erosive esophagitis (01/2007) colonoscopy (6/09)- hyperplastic polyp and hemorrhoids Pelvic US- left ovarian cyst CT- head- old infarction (11/1997)  Family History: Last updated: 11/15/2009 Father: MI's, HTN, skin cancer, died of ca - lung/liver/adrenal -- was a smoker  Mother: MI's, ? CVA's Siblings: 1 sister with HTN No FH of Colon Cancer:  Social History: Last updated: 11/22/2008 Marital Status: Married Children: none Occupation: disability Current Smoker  Risk Factors: Alcohol Use: 0 (10/14/2009)  Risk Factors: Smoking Status: current (10/14/2009) Packs/Day: 1.5 (10/14/2009)  Past Medical History: Allergic rhinitis Anxiety Tobacco abuse  COPD / emphysema Coronary artery disease- CABG CVA  Depression Diabetes mellitus, type II GERD / erosive esophagitis Hyperlipidemia Hypertension Obstuctive Sleep Apnea Hyperplastic colon polyps Internal hemorrhoids goiter with thyroid nodules- watched by Dr Talmage Nap   opthy[facility]acinth [facility]eker ortho- [place]  endo- [facility]  pulm- [facility]  Family History: Father: MI's, HTN, skin cancer, died of ca - lung/liver/adrenal -- was a smoker  Mother: MI's, ? CVA's Siblings: 1 sister with HTN No FH of Colon Cancer:  Review of Systems General:  Denies fatigue, fever, loss of appetite, and malaise. Eyes:  Denies  blurring and eye pain. ENT:  Denies sinus pressure and sore throat. CV:  Denies chest pain or discomfort, lightheadness, palpitations, and shortness of breath with exertion. Resp:  Denies cough and wheezing. GI:  Denies abdominal pain, bloody stools, change in bowel habits, indigestion, and loss of appetite. GU:  Denies abnormal vaginal bleeding and hematuria. MS:  Complains of joint pain, low back pain, and mid back pain; denies joint redness and muscle weakness. Derm:  Denies itching, lesion(s), poor wound healing, and rash. Neuro:  Complains of tingling; denies numbness. Psych:  Denies anxiety and depression. Endo:  Denies cold  intolerance, excessive thirst, and excessive urination.  Physical Exam  General:  overweight but generally well appearing  Head:  normocephalic, atraumatic, and no abnormalities observed.   Eyes:  vision grossly intact, pupils equal, pupils round, and pupils reactive to light.  no conjunctival pallor, injection or icterus  Ears:  R ear normal and L ear normal.   Mouth:  pharynx pink and moist.   Neck:  supple with full rom and no masses or thyromegally, no JVD or carotid bruit  Lungs:  CTA with diffuslely distant bs and no crackles or wheeze  Heart:  Normal rate and regular rhythm. S1 and S2 normal without gallop, murmur, click, rub or other extra sounds. Abdomen:  Bowel sounds positive,abdomen soft and non-tender without masses, organomegaly or hernias noted. Msk:  No deformity or scoliosis noted of thoracic or lumbar spine.  poor rom spine/knees due to pain Pulses:  R and L carotid,radial,femoral,dorsalis pedis and posterior tibial pulses are full and equal bilaterally Extremities:  trace left pedal edema and trace right pedal edema.   Neurologic:  sensation intact to light touch, gait normal, and DTRs symmetrical and normal.   Skin:  Intact without suspicious lesions or rashes Cervical Nodes:  No lymphadenopathy noted Psych:  seems down today  Diabetes Management Exam:    Foot Exam (with socks and/or shoes not present):       Sensory-Pinprick/Light touch:          Left medial foot (L-4): normal          Left dorsal foot (L-5): normal          Left lateral foot (S-1): normal          Right medial foot (L-4): normal          Right dorsal foot (L-5): normal          Right lateral foot (S-1): normal       Sensory-Monofilament:          Left foot: normal          Right foot: normal       Inspection:          Left foot: normal          Right foot: normal       Nails:          Left foot: normal          Right foot: normal   Impression & Recommendations:  Problem # 1:  OTHER  SCREENING MAMMOGRAM (ICD-V76.12) Assessment Comment Only sched annual mam  f/u for check up in summer Orders: Radiology Referral (Radiology)  Problem # 2:  TOBACCO USE (ICD-305.1) Assessment: Unchanged discussed in detail risks of smoking, and possible outcomes including COPD, vascular dz, cancer and also respiratory infections/sinus problems  pt continues to attempt to quit- unsure if motivated   Problem # 3:  HYPERTENSION (ICD-401.9) Assessment:  Unchanged  bp in very good control rev last bmp from this mo f/u 6 mo  Her updated medication list for this problem includes:    Hydrochlorothiazide 50 Mg Tabs (Hydrochlorothiazide) .[name]... Take one by mouth daily    Carvedilol 3.125 Mg Tabs (Carvedilol) ..... One by mouth two times a day    Micardis 40 Mg Tabs (Telmisartan) .[name]... Take 1 tablet by mouth once a day  Orders: Venipuncture (82993) TLB-Lipid Panel (80061-LIPID) TLB-CBC Platelet - w/Differential (85025-CBCD) TLB-Hepatic/Liver Function Pnl (80076-HEPATIC)  BP today: 130/74 Prior BP: 130/80 (11/02/2009)  Labs Reviewed: K+: 4.1 (11/02/2009) Creat: : 0.9 (11/02/2009)   Chol: 113 (05/18/2009)   HDL: 37.70 (05/18/2009)   LDL: 46 (05/18/2009)   TG: 149.0 (05/18/2009)  Problem # 4:  HYPERLIPIDEMIA (ICD-272.4) Assessment: Unchanged  due for labs - has been at goal with crestor  disc low sat fat diet  Her updated medication list for this problem includes:    Crestor 10 Mg Tabs (Rosuvastatin calcium) .[name]... Take one by mouth daily  Orders: Venipuncture (71696) TLB-Lipid Panel (80061-LIPID) TLB-CBC Platelet - w/Differential (85025-CBCD) TLB-Hepatic/Liver Function Pnl (80076-HEPATIC)  Labs Reviewed: SGOT: 26 (05/18/2009)   SGPT: 36 (05/18/2009)   HDL:37.70 (05/18/2009), 38.3 (11/04/2008)  LDL:46 (05/18/2009), 60 (11/04/2008)  Chol:113 (05/18/2009), 124 (11/04/2008)  Trig:149.0 (05/18/2009), 127 (11/04/2008)  Problem # 5:  DIABETES MELLITUS, TYPE II (ICD-250.00) Assessment:  Comment Only per care of endo -- per pt has been relatively stable  Her updated medication list for this problem includes:    Janumet 50-1000 Mg Tabs (Sitagliptin-metformin hcl) .[name]... Take 1 tablet by mouth two times a day    Aspirin 325 Mg Tabs (Aspirin) .[name]... Take one by mouth daily with food    Amaryl 2 Mg Tabs (Glimepiride) .[name]... Take 1 tablet by mouth two times a day    Micardis 40 Mg Tabs (Telmisartan) .[name]... Take 1 tablet by mouth once a day  Orders: Venipuncture (78938) TLB-Lipid Panel (80061-LIPID) TLB-CBC Platelet - w/Differential (85025-CBCD) TLB-Hepatic/Liver Function Pnl (80076-HEPATIC)  Problem # 6:  Preventive Health Care (ICD-V70.0) Assessment: Comment Only Td today no flu shot due to egg allergy  Complete Medication List: 1)  Spiriva Handihaler 18 Mcg Caps (Tiotropium bromide monohydrate) .... One puff once daily 2)  Singulair 10 Mg Tabs (Montelukast sodium) .... Take one by mouth daily 3)  Albuterol Sulfate (2.5 Mg/51ml) 0.083% Nebu (Albuterol sulfate) .... Use one vial in neb every 4 hours as needed 4)  Ventolin Hfa 108 (90 Base) Mcg/act Aers (Albuterol sulfate) .... 2 puffs up to every 4 hours as needed wheeze 5)  Clarinex 5 Mg Tabs (Desloratadine) .[name].. 1 by mouth once daily 6)  Hydrochlorothiazide 50 Mg Tabs (Hydrochlorothiazide) .... Take one by mouth daily 7)  Potassium Chloride Cr 10 Meq Cpcr (Potassium chloride) .... Take one by mouth two times a day 8)  Crestor 10 Mg Tabs (Rosuvastatin calcium) .... Take one by mouth daily 9)  Janumet 50-1000 Mg Tabs (Sitagliptin-metformin hcl) .... Take 1 tablet by mouth two times a day 10)  Plavix 75 Mg Tabs (Clopidogrel bisulfate) .... Take one by mouth daily 11)  Imdur 60 Mg Xr24h-tab (Isosorbide mononitrate) .... Take by mouth two times a day 12)  Aspirin 325 Mg Tabs (Aspirin) .... Take one by mouth daily with food 13)  Amaryl 2 Mg Tabs (Glimepiride) .... Take 1 tablet by mouth two times a day 14)  Mucinex 600 Mg Tb12  (Guaifenesin) .... 2 by mouth two times a day 15)  Omeprazole 20  Mg Cpdr (Omeprazole) .[name].. 1 twice a day 30 minutes before meals. 16)  Carvedilol 3.125 Mg Tabs (Carvedilol) .... One by mouth two times a day 17)  Nitrolingual 0.4 Mg/spray Tl Soln (Nitroglycerin) .... Use as directed 18)  Propoxyphene N-apap 100-650 Mg Tabs (Propoxyphene n-apap) .... One by mouth two times a day as needed 19)  Flexeril 10 Mg Tabs (Cyclobenzaprine hcl) .... One by mouth at bedtime as needed 20)  Neurontin 600 Mg Tabs (Gabapentin) .... As directed 21)  Ranexa 500 Mg Xr12h-tab (Ranolazine) .... Take 1 tablet by mouth two times a day 22)  Micardis 40 Mg Tabs (Telmisartan) .... Take 1 tablet by mouth once a day  Other Orders: TD Toxoids IM 7 YR + (29518) Admin 1st Vaccine (84166)  Patient Instructions: 1)  do not forget to schedule your eye exam  2)  we will schedule mammogam at check out  3)  keep working on quitting smoking and weight loss 4)  tetnus shot today  5)  follow up for 30 min check up with pap in 6 months  Current Allergies (reviewed today): TETRACYCLINE ACE INHIBITORS * EGGS   Preventive Care Screening  Contraindications of Treatment or Deferment of Test/Procedure:    Treatment: Flu Shot    Contraindication: egg allergy   Immunizations Administered:  Tetanus Vaccine:    Vaccine Type: Td    Site: left deltoid    Mfr: Sanofi Pasteur    Dose: 0.5 ml    Route: IM    Given by: Lewanda Rife LPN    Exp. Date: 08/16/2011    Lot #: [id number]    VIS given: 08/19/07 version given November 15, 2009.

## 2010-10-31 NOTE — Progress Notes (Signed)
Summary: refill request for ventolin  Phone Note Refill Request Message from:  Fax from Pharmacy  Refills Requested: Medication #1:  VENTOLIN HFA 108 (90 BASE) MCG/ACT  AERS 2 puffs up to every 4 hours as needed wheeze Faxed form from [facility] is on your shelf.  Initial call taken by: Lowella Petties CMA,  September 01, 2009 9:57 AM  Follow-up for Phone Call        form done and in nurse in box  Follow-up by: Judith Part MD,  September 01, 2009 11:15 AM  Additional Follow-up for Phone Call Additional follow up Details #1::        Form faxed. Additional Follow-up by: Lowella Petties CMA,  September 01, 2009 11:19 AM    Prescriptions: VENTOLIN HFA 108 (90 BASE) MCG/ACT  AERS (ALBUTEROL SULFATE) 2 puffs up to every 4 hours as needed wheeze  #2 mdi x 3   Entered and Authorized by:   Judith Part MD   Signed by:   Judith Part MD on 09/01/2009   Method used:   Historical   RxID:   [id number]

## 2010-10-31 NOTE — Assessment & Plan Note (Signed)
Summary: FOLLOW UP DIABEATES/[facility]  Medications Added AMARYL 4 MG  TABS (GLIMEPIRIDE) one by mouth qd JANUVIA 100 MG  TABS (SITAGLIPTIN PHOSPHATE) 1po qd MUCINEX 600 MG  TB12 (GUAIFENESIN) 2 by mouth bid        Vital Signs:  Patient Profile:   55 Years Old Female Weight:      161 pounds Temp:     97.4 degrees F oral Pulse rate:   76 / minute Pulse rhythm:   regular BP sitting:   118 / 66  (left arm) Cuff size:   regular  Vitals Entered By: Lowella Petties (August 08, 2007 2:15 PM)                 Chief Complaint:  Follow up[.  History of Present Illness: is doing ok overall still having a lot of chest pain- f/u by cardiology was started on amaryl by Dr Talmage Nap- sugars had been better and the went up again she thinks her AIC went from the 9s to the 7s and then back up is trying to get am blood sugars down can do some exercise - chest pain and back pain are limiting her  has been loosing wt is eating better overall   other than the pain, she is ok  tried to quit smoking- even took chantix and it did not help, still gets extremely anxious and jittery she knows if she does not quit it will kill her wants to see if psychiatry to see if she can re start xanax wants to try to quit again  needs px for mucinex and aspirin saw dentist- good report sees opthy next week  Current Allergies: TETRACYCLINE PREVACID ACE INHIBITORS * EGGS     Review of Systems      See HPI  General      Complains of fatigue.      Denies chills, fever, and loss of appetite.  Eyes      Denies blurring.      some headaches- sees opthy next week  CV      Complains of chest pain or discomfort and shortness of breath with exertion.  Resp      Denies cough.  GI      Denies change in bowel habits.  MS      Complains of low back pain and mid back pain.  Derm      Denies rash.  Neuro      Denies numbness.  Psych      Complains of anxiety.  Endo      Denies excessive  thirst and excessive urination.   Physical Exam  General:     has lost some wt overall well appearing Head:     normocephalic, atraumatic, and no abnormalities observed.   Eyes:     vision grossly intact, pupils equal, pupils round, pupils reactive to light, and no injection.   Nose:     no nasal discharge.   Mouth:     pharynx pink and moist.   Neck:     No deformities, masses, or tenderness noted.supple, no thyromegaly, no JVD, and no carotid bruits.   Lungs:     diffusely distant bs with only scant wheeze on forced exp no crackles Heart:     RRR with soft systolic M Pulses:     R and L carotid,radial,femoral,dorsalis pedis and posterior tibial pulses are full and equal bilaterally Extremities:     No clubbing, cyanosis, edema, or deformity noted with normal  full range of motion of all joints.   Neurologic:     sensation intact to light touch, gait normal, and DTRs symmetrical and normal.   Skin:     turgor normal, color normal, and no rashes.   Cervical Nodes:     No lymphadenopathy noted Psych:     baseline mildly anxious affect    Impression & Recommendations:  Problem # 1:  HYPERTENSION (ICD-401.9) bp is in good control with current meds and watching dietary sodium will check renal panel- copy to cardiol Her updated medication list for this problem includes:    Hydrochlorothiazide 50 Mg Tabs (Hydrochlorothiazide) .[name]... Take one by mouth daily    Diovan 160 Mg Tabs (Valsartan) .[name]... Take one by mouth daily    Lopressor Tabs (Metoprolol tartrate tabs) .[name]... Take 25 mg by mouth daily  Orders: Venipuncture (16109) T-Renal Function Panel (60454-09811) T-TSH [phone])   Problem # 2:  HYPERLIPIDEMIA (ICD-272.4) will check lipids- copy to cardiol keep working on diet disc goals for control with CAD labs may be slt eff by not fasting- aware Her updated medication list for this problem includes:    Crestor 10 Mg Tabs (Rosuvastatin calcium) .[name]... Take one by  mouth daily  Orders: Venipuncture (13086) T-Lipid Profile (57846-96295) T- * Misc. Laboratory test [phone])   Problem # 3:  TOBACCO USE (ICD-305.1) disc need to quit in great detail pt may decide to try chantix one more time she is aware that future health depends greatly on quitting pt also wants to disc this further with psychiatry- since anx is ongoing issure with inablility to quit  Problem # 4:  DIABETES MELLITUS, TYPE II (ICD-250.00) control is fluctuating- but pt has made good progress with wt loss will continue present management with Dr Talmage Nap Her updated medication list for this problem includes:    Diovan 160 Mg Tabs (Valsartan) .[name]... Take one by mouth daily    Metformin Hcl 1000 Mg Tabs (Metformin hcl) .[name]... Take one by mouth bid    Aspirin 325 Mg Tabs (Aspirin) .[name]... Take one by mouth daily    Amaryl 4 Mg Tabs (Glimepiride) ..... One by mouth qd    Januvia 100 Mg Tabs (Sitagliptin phosphate) .[name]... 1po qd   Complete Medication List: 1)  Wellbutrin Xl 300 Mg Tb24 (Bupropion hcl) .... Take one by mouth daily 2)  Hydrochlorothiazide 50 Mg Tabs (Hydrochlorothiazide) .... Take one by mouth daily 3)  Diovan 160 Mg Tabs (Valsartan) .... Take one by mouth daily 4)  Advair Diskus 100-50 Mcg/dose Misc (Fluticasone-salmeterol) .... Use two times a day as directed 5)  Potassium Chloride Cr 10 Meq Cpcr (Potassium chloride) .... Take one by mouth bid 6)  Clarinex 5 Mg Tabs (Desloratadine) .... Take one by mouth daily 7)  Singulair 10 Mg Tabs (Montelukast sodium) .... Take one by mouth daily 8)  Lorazepam 1 Mg Tabs (Lorazepam) .... Take one by mouth tid 9)  Crestor 10 Mg Tabs (Rosuvastatin calcium) .... Take one by mouth daily 10)  Metformin Hcl 1000 Mg Tabs (Metformin hcl) .... Take one by mouth bid 11)  Lyrica 75 Mg Caps (Pregabalin) .... Take one by mouth tid 12)  Cymbalta 60 Mg Cpep (Duloxetine hcl) .... Take one by mouth daily 13)  Lopressor Tabs (Metoprolol tartrate tabs) ....  Take 25 mg by mouth daily 14)  Plavix 75 Mg Tabs (Clopidogrel bisulfate) .... Take one by mouth daily 15)  Imdur Tb24 (Isosorbide mononitrate tb24) .... Take by mouth as directed prn 16)  Aspirin 325 Mg Tabs (Aspirin) .... Take one by mouth daily 17)  Amaryl 4 Mg Tabs (Glimepiride) .... One by mouth qd 18)  Januvia 100 Mg Tabs (Sitagliptin phosphate) .[name].. 1po qd 19)  Mucinex 600 Mg Tb12 (Guaifenesin) .... 2 by mouth bid   Patient Instructions: 1)  keep trying to quit smoking 2)  keep working on diet and exercise 3)  we are checking your cholesterol and thyroid    Prescriptions: MUCINEX 600 MG  TB12 (GUAIFENESIN) 2 by mouth bid  #3 months x 3   Entered and Authorized by:   Judith Part MD   Signed by:   Judith Part MD on 08/08/2007   Method used:   Print then Give to Patient   RxID:   [id number] ASPIRIN 325 MG  TABS (ASPIRIN) take one by mouth daily  #90 x 3   Entered and Authorized by:   Judith Part MD   Signed by:   Judith Part MD on 08/08/2007   Method used:   Print then Give to Patient   RxID:   [id number]  ] Prior Medications: WELLBUTRIN XL 300 MG  TB24 (BUPROPION HCL) take one by mouth daily HYDROCHLOROTHIAZIDE 50 MG  TABS (HYDROCHLOROTHIAZIDE) take one by mouth daily DIOVAN 160 MG  TABS (VALSARTAN) take one by mouth daily ADVAIR DISKUS 100-50 MCG/DOSE  MISC (FLUTICASONE-SALMETEROL) use two times a day as directed POTASSIUM CHLORIDE CR 10 MEQ  CPCR (POTASSIUM CHLORIDE) take one by mouth bid CLARINEX 5 MG  TABS (DESLORATADINE) take one by mouth daily SINGULAIR 10 MG  TABS (MONTELUKAST SODIUM) take one by mouth daily LORAZEPAM 1 MG  TABS (LORAZEPAM) take one by mouth tid CRESTOR 10 MG  TABS (ROSUVASTATIN CALCIUM) take one by mouth daily METFORMIN HCL 1000 MG  TABS (METFORMIN HCL) take one by mouth bid LYRICA 75 MG  CAPS (PREGABALIN) take one by mouth tid CYMBALTA 60 MG  CPEP (DULOXETINE HCL) take one by mouth daily LOPRESSOR   TABS (METOPROLOL  TARTRATE TABS) take 25 mg by mouth daily PLAVIX 75 MG  TABS (CLOPIDOGREL BISULFATE) take one by mouth daily IMDUR   TB24 (ISOSORBIDE MONONITRATE TB24) take by mouth as directed prn ASPIRIN 325 MG  TABS (ASPIRIN) take one by mouth daily AMARYL 4 MG  TABS (GLIMEPIRIDE) one by mouth qd JANUVIA 100 MG  TABS (SITAGLIPTIN PHOSPHATE) 1po qd MUCINEX 600 MG  TB12 (GUAIFENESIN) 2 by mouth bid Current Allergies: TETRACYCLINE PREVACID ACE INHIBITORS * EGGS

## 2010-10-31 NOTE — Medication Information (Signed)
Summary: Ventolin/[facility]  Ventolin/[facility]   Imported By: Sherian Rein 05/10/2010 15:10:33  _____________________________________________________________________  External Attachment:    Type:   Image     Comment:   External Document

## 2010-10-31 NOTE — Progress Notes (Signed)
Summary: Rx's for 90 day supply  Phone Note Refill Request Call back at [phone] Message from:  [facility]  on October 21, 2009 10:13 AM  Refills Requested: Medication #1:  SINGULAIR 10 MG  TABS take one by mouth daily   Supply Requested: 3 months  Medication #2:  PLAVIX 75 MG  TABS take one by mouth daily   Supply Requested: 3 months  Medication #3:  HYDROCHLOROTHIAZIDE 50 MG  TABS take one by mouth daily   Supply Requested: 3 months  Medication #4:  CRESTOR 10 MG  TABS take one by mouth daily   Supply Requested: 3 months Received faxed refill request for a 90 day supply.  Fax number [phone]   Method Requested: Fax to Mail Away Pharmacy Initial call taken by: Linde Gillis CMA Duncan Dull),  October 21, 2009 10:14 AM  Follow-up for Phone Call        printed in put in nurse in box for pickup   Follow-up by: Judith Part MD,  October 21, 2009 1:20 PM  Additional Follow-up for Phone Call Additional follow up Details #1::        Forms and scripts faxed. Additional Follow-up by: Lowella Petties CMA,  October 21, 2009 4:40 PM    New/Updated Medications: POTASSIUM CHLORIDE CR 10 MEQ  CPCR (POTASSIUM CHLORIDE) take one by mouth two times a day CRESTOR 10 MG  TABS (ROSUVASTATIN CALCIUM) take one by mouth daily PLAVIX 75 MG  TABS (CLOPIDOGREL BISULFATE) take one by mouth daily Prescriptions: POTASSIUM CHLORIDE CR 10 MEQ  CPCR (POTASSIUM CHLORIDE) take one by mouth two times a day  #180 x 3   Entered and Authorized by:   Judith Part MD   Signed by:   Judith Part MD on 10/21/2009   Method used:   Printed then faxed to ...       [facility]  [address] [phone]* (retail)       [address]       [place], [place]  [address]       Ph: [phone] or [phone]       Fax: [phone]   RxID:   [id number] CRESTOR 10 MG  TABS (ROSUVASTATIN CALCIUM) take one by mouth daily  #90 x 3   Entered and Authorized by:   Judith Part MD  Signed by:   Judith Part MD on 10/21/2009   Method used:   Printed then faxed to ...       [facility] [address] [phone]* (retail)       [address]       [place], [place]  [address]       Ph: [phone] or [phone]       Fax: [phone]   RxID:   [id number] HYDROCHLOROTHIAZIDE 50 MG  TABS (HYDROCHLOROTHIAZIDE) take one by mouth daily  #90 x 3   Entered and Authorized by:   Judith Part MD   Signed by:   Judith Part MD on 10/21/2009   Method used:   Printed then faxed to ...       [facility] [address] [phone]* (retail)       [address]       [place], [place]  [address]       Ph: [phone] or [phone]  Fax: [phone]   RxID:   [id number] PLAVIX 75 MG  TABS (CLOPIDOGREL BISULFATE) take one by mouth daily  #90 x 3   Entered and Authorized by:   Judith Part MD   Signed by:   Judith Part MD on 10/21/2009   Method used:   Printed then faxed to ...       [facility] [address] [phone]* (retail)       [address]       [place], [place]  [address]       Ph: [phone] or [phone]       Fax: [phone]   RxID:   [id number] SINGULAIR 10 MG  TABS (MONTELUKAST SODIUM) take one by mouth daily  #90 x 3   Entered and Authorized by:   Judith Part MD   Signed by:   Judith Part MD on 10/21/2009   Method used:   Printed then faxed to ...       [facility] [address] [phone]* (retail)       [address]       [place], [place]  [address]       Ph: [phone] or [phone]       Fax: [phone]   RxID:   [id number]

## 2010-10-31 NOTE — Assessment & Plan Note (Signed)
Summary: rov after pft ///kp   Copy to:  n/a Primary Provider/Referring Provider:  [name], MD  CC:  Pt here for follow up with PFT. Pt states not sleeping well and is using CPAP "part" of the night and is having a hard time getting to sleep.  History of Present Illness: 55 yo female with COPD, OSA, and tobacco abuse.  Her breathing is somewhat improved with spiriva.  She still gets winded, and has some cough and sputum.  She is not using her rescue inhaler that much.  She is using her CPAP about 4 or 5 hours per night.  She has not had her CPAP download picked up yet by her DME.  She is still smoking cigarettes.  PFT's done 11/02/09  showed normal spirometry, no bronchodilator response, normal lung volumes, and moderate diffusion defect.  Current Medications (verified): 1)  Spiriva Handihaler 18 Mcg Caps (Tiotropium Bromide Monohydrate) .... One Puff Once Daily 2)  Singulair 10 Mg  Tabs (Montelukast Sodium) .... Take One By Mouth Daily 3)  Clarinex 5 Mg Tabs (Desloratadine) .[name].. 1 By Mouth Once Daily 4)  Albuterol Sulfate (2.5 Mg/93ml) 0.083% Nebu (Albuterol Sulfate) .... Use One Vial in [place] Every 4 Hours As Needed 5)  Ventolin Hfa 108 (90 Base) Mcg/act  Aers (Albuterol Sulfate) .... 2 Puffs Up To Every 4 Hours As Needed Wheeze 6)  Hydrochlorothiazide 50 Mg  Tabs (Hydrochlorothiazide) .... Take One By Mouth Daily 7)  Potassium Chloride Cr 10 Meq  Cpcr (Potassium Chloride) .... Take One By Mouth Two Times A Day 8)  Crestor 10 Mg  Tabs (Rosuvastatin Calcium) .... Take One By Mouth Daily 9)  Janumet 50-1000 Mg Tabs (Sitagliptin-Metformin Hcl) .... Take 1 Tablet By Mouth Two Times A Day 10)  Plavix 75 Mg  Tabs (Clopidogrel Bisulfate) .... Take One By Mouth Daily 11)  Imdur 60 Mg  Xr24h-Tab (Isosorbide Mononitrate) .... Take By Mouth Two Times A Day 12)  Aspirin 325 Mg  Tabs (Aspirin) .... Take One By Mouth Daily With Food 13)  Amaryl 2 Mg Tabs (Glimepiride) .... Take 1 Tablet By Mouth  Two Times A Day 14)  Mucinex 600 Mg  Tb12 (Guaifenesin) .... 2 By Mouth Two Times A Day 15)  Omeprazole 20 Mg  Cpdr (Omeprazole) .[name].. 1 Twice A Day 30 Minutes Before Meals. 16)  Carvedilol 3.125 Mg  Tabs (Carvedilol) .... One By Mouth Two Times A Day 17)  Nitrolingual 0.4 Mg/spray Tl Soln (Nitroglycerin) .... Use As Directed 18)  Propoxyphene N-Apap 100-650 Mg  Tabs (Propoxyphene N-Apap) .... One By Mouth Two Times A Day As Needed 19)  Flexeril 10 Mg  Tabs (Cyclobenzaprine Hcl) .... One By Mouth At Bedtime As Needed 20)  Neurontin 600 Mg Tabs (Gabapentin) .... As Directed 21)  Ranexa 500 Mg Xr12h-Tab (Ranolazine) .... Take 1 Tablet By Mouth Two Times A Day 22)  Micardis 40 Mg Tabs (Telmisartan) .... Take 1 Tablet By Mouth Once A Day  Allergies (verified): 1)  Tetracycline 2)  Ace Inhibitors 3)  * Eggs  Past History:  Past Medical History: Reviewed history from 10/14/2009 and no changes required. Allergic rhinitis Anxiety Tobacco abuse  COPD / emphysema Coronary artery disease- CABG CVA  Depression Diabetes mellitus, type II GERD / erosive esophagitis Hyperlipidemia Hypertension Obstuctive Sleep Apnea Hyperplastic colon polyps Internal hemorrhoids  opthy- [facility]inth Meeker ortho- [place]  endo- [facility]  pulm- [facility] psych  Past Surgical History: Reviewed history from 06/14/2009 and no changes required. Appendectomy Cholecystectomy Tonsillectomy Endometrial  biopsy- neg (DUB)   Cervical fusion x 3 Left foot surgry- plantar fasciitis Exp. laparoscopy- intestinal obstruction Left arm fracture (07/2004)  Carotid doppler- left stenosis Dexa- normal (11/2002) Abd US- echodensity (01/2003)   worse (12/2006) Cardiolite- pos (07/2006) Cath- stent LCA, OM (08/2006) EGD - erosive esophagitis (01/2007) colonoscopy (6/09)- hyperplastic polyp and hemorrhoids Pelvic US- left ovarian cyst CT- head- old infarction (11/1997)  Vital Signs:  Patient profile:   55 year old female Height:       57 inches Weight:      169 pounds O2 Sat:      94 % on Room air Temp:     97.2 degrees F oral Pulse rate:   106 / minute BP sitting:   130 / 80  (right arm) Cuff size:   regular  Vitals Entered By: Zackery Barefoot CMA (November 02, 2009 4:05 PM)  O2 Flow:  Room air CC: Pt here for follow up with PFT. Pt states not sleeping well and is using CPAP "part" of the night and is having a hard time getting to sleep Comments Medications reviewed with patient Verified pt's contact number Zackery Barefoot CMA  November 02, 2009 4:07 PM    Physical Exam  General:  obese.   Nose:  clear nasal discharge.   Mouth:  MP 3, no oral lesions Neck:  no JVD.   Lungs:  diminished breath sounds, no wheezing or rales Heart:  Regular rate and rhythm; no murmurs, rubs,  or bruits. Abdomen:  soft, non-tender, normal bowel sounds Extremities:  no clubbing, cyanosis, edema, or deformity noted Cervical Nodes:  No lymphadenopathy noted   Impression & Recommendations:  Problem # 1:  DYSPNEA (ICD-786.05) She has some improvement with inhalers, and will continue.  She has isolated diffusion defect on PFT's.  Will schedule CT chest with contrast to further evaluate possible interstitial lung process.  Problem # 2:  OBSTRUCTIVE SLEEP APNEA (ICD-327.23) Will get copy of CPAP download.  Problem # 3:  TOBACCO USE (ICD-305.1) Again emphasized the importance of smoking cessation.  Medications Added to Medication List This Visit: 1)  Clarinex 5 Mg Tabs (Desloratadine) .[name].. 1 by mouth once daily 2)  Amaryl 2 Mg Tabs (Glimepiride) .... Take 1 tablet by mouth two times a day 3)  Mucinex 600 Mg Tb12 (Guaifenesin) .... 2 by mouth two times a day 4)  Flexeril 10 Mg Tabs (Cyclobenzaprine hcl) .... One by mouth at bedtime as needed  Complete Medication List: 1)  Spiriva Handihaler 18 Mcg Caps (Tiotropium bromide monohydrate) .... One puff once daily 2)  Singulair 10 Mg Tabs (Montelukast sodium) .... Take one by  mouth daily 3)  Albuterol Sulfate (2.5 Mg/59ml) 0.083% Nebu (Albuterol sulfate) .... Use one vial in neb every 4 hours as needed 4)  Ventolin Hfa 108 (90 Base) Mcg/act Aers (Albuterol sulfate) .... 2 puffs up to every 4 hours as needed wheeze 5)  Clarinex 5 Mg Tabs (Desloratadine) .[name].. 1 by mouth once daily 6)  Hydrochlorothiazide 50 Mg Tabs (Hydrochlorothiazide) .... Take one by mouth daily 7)  Potassium Chloride Cr 10 Meq Cpcr (Potassium chloride) .... Take one by mouth two times a day 8)  Crestor 10 Mg Tabs (Rosuvastatin calcium) .... Take one by mouth daily 9)  Janumet 50-1000 Mg Tabs (Sitagliptin-metformin hcl) .... Take 1 tablet by mouth two times a day 10)  Plavix 75 Mg Tabs (Clopidogrel bisulfate) .... Take one by mouth daily 11)  Imdur 60 Mg Xr24h-tab (Isosorbide mononitrate) .Marland KitchenMarland KitchenMarland Kitchen  Take by mouth two times a day 12)  Aspirin 325 Mg Tabs (Aspirin) .... Take one by mouth daily with food 13)  Amaryl 2 Mg Tabs (Glimepiride) .... Take 1 tablet by mouth two times a day 14)  Mucinex 600 Mg Tb12 (Guaifenesin) .... 2 by mouth two times a day 15)  Omeprazole 20 Mg Cpdr (Omeprazole) .[name].. 1 twice a day 30 minutes before meals. 16)  Carvedilol 3.125 Mg Tabs (Carvedilol) .... One by mouth two times a day 17)  Nitrolingual 0.4 Mg/spray Tl Soln (Nitroglycerin) .... Use as directed 18)  Propoxyphene N-apap 100-650 Mg Tabs (Propoxyphene n-apap) .... One by mouth two times a day as needed 19)  Flexeril 10 Mg Tabs (Cyclobenzaprine hcl) .... One by mouth at bedtime as needed 20)  Neurontin 600 Mg Tabs (Gabapentin) .... As directed 21)  Ranexa 500 Mg Xr12h-tab (Ranolazine) .... Take 1 tablet by mouth two times a day 22)  Micardis 40 Mg Tabs (Telmisartan) .... Take 1 tablet by mouth once a day  Other Orders: Est. Patient Level III (52841) DME Referral (DME) TLB-BMP (Basic Metabolic Panel-BMET) (80048-METABOL) Radiology Referral (Radiology)  Patient Instructions: 1)  Will schedule CT chest 2)  Will get  copy of CPAP report 3)  Follow up in 3 months

## 2010-10-31 NOTE — Progress Notes (Signed)
Summary: refill requests for hctz, plavix  Phone Note Refill Request Message from:  Fax from Pharmacy  Refills Requested: Medication #1:  HYDROCHLOROTHIAZIDE 50 MG  TABS take one by mouth daily  Medication #2:  PLAVIX 75 MG  TABS take one by mouth daily Faxed form from [facility] is on your shelf.  Initial call taken by: Lowella Petties CMA,  January 26, 2010 4:33 PM  Follow-up for Phone Call        one again- I did these in jan  tell the pt SHE needs to inst [facility] to stop asking for meds that are not due yet  I will hold on to paper just in case  Follow-up by: Judith Part MD,  January 27, 2010 8:07 AM  Additional Follow-up for Phone Call Additional follow up Details #1::        Could not reach pt by phone. (phone not receiving calls now). I called [facility] [phone] and spoke with colleen and asked her to correct computer so not more refill request that they already have.Lewanda Rife LPN  January 27, 2010 10:08 AM

## 2010-10-31 NOTE — Assessment & Plan Note (Signed)
Summary: CHECK UP WITH PAP 30 MIN APPT. / LFW   Vital Signs:  Patient profile:   55 year old female Height:      57 inches Weight:      169.50 pounds BMI:     36.81 Temp:     97.7 degrees F oral Pulse rate:   84 / minute Pulse rhythm:   regular BP sitting:   132 / 80  (left arm) Cuff size:   regular  Vitals Entered By: Lewanda Rife LPN (May 11, 2010 10:49 AM) CC: check up / no gyn   History of Present Illness: here for check up of chronic med problems   feeling ok overall stays chronically tired    had gyn exam recently  had a scan with incidental pelvic finding - ? fluid in fallopian tube had ca 125 -- and that is pending  exam was normal -- last week  does generally have pelvic pain   depression- inc her wellbutrin and put her on buspar  still waiting to see if that will help   wt is up 2 lb  bp is stable 132/80 today  sees endo for thyroid no changes -- had recent US and it looked stable   sees Dr Craige Cotta for pulm-- started flonase and netti pot and cough recurred  voice is scratchy  ragweed is bothering her more  breathing is the same   sees endo for DM-- has appt last mo  last AIC was 7.1  is trying to eat healthy a little exercise -- hard with the heat/ some in the house -- has a Wii   tab-- keeps trying to quit -- unsuccessful   sees cardiol next mo - has been stable   lipids due - has been well controlled on statin  colonosc - hyperplastic polyp in 09 due 10 years   Td 2011 ptx 2010  dexa- just did one at gyn and was nl last mo  ca and D -- needs to get back on it   gerd is worse - needs to f/u with Dr Russella Dar- also cough  mam 4/11 self exam - no lumps and also had gyn exam   spots on her R leg to check - not changing - one scaley awful dry skin   Allergies: 1)  Tetracycline 2)  Ace Inhibitors 3)  * Eggs  Past History:  Past Surgical History: Last updated: 06/14/2009 Appendectomy Cholecystectomy Tonsillectomy Endometrial  biopsy- neg (DUB)   Cervical fusion x 3 Left foot surgry- plantar fasciitis Exp. laparoscopy- intestinal obstruction Left arm fracture (07/2004)  Carotid doppler- left stenosis Dexa- normal (11/2002) Abd US- echodensity (01/2003)   worse (12/2006) Cardiolite- pos (07/2006) Cath- stent LCA, OM (08/2006) EGD - erosive esophagitis (01/2007) colonoscopy (6/09)- hyperplastic polyp and hemorrhoids Pelvic US- left ovarian cyst CT- head- old infarction (11/1997)  Family History: Last updated: 11/15/2009 Father: MI's, HTN, skin cancer, died of ca - lung/liver/adrenal -- was a smoker  Mother: MI's, ? CVA's Siblings: 1 sister with HTN No FH of Colon Cancer:  Social History: Last updated: 11/22/2008 Marital Status: Married Children: none Occupation: disability Current Smoker  Risk Factors: Alcohol Use: 0 (10/14/2009)  Risk Factors: Smoking Status: current (10/14/2009) Packs/Day: 1.5 (10/14/2009)  Past Medical History: Allergic rhinitis Anxiety Tobacco abuse       - PFT 11/02/09 FEV1 1.85(95%), FEV1% 82, TLC 3.98 (101), DLCO 59%, no BD Coronary artery disease- CABG CVA  Depression Diabetes mellitus, type II GERD / erosive esophagitis Hyperlipidemia  Hypertension Obstuctive Sleep Apnea      - PSG 04/10/06 RDI 105      - CPAP 9 cm H2O Hyperplastic colon polyps Internal hemorrhoids Goiter with thyroid nodules- watched by Dr Talmage Nap   opthyHyacinth Meeker ortho- [place]  endo- [facility]  pulm- [facility] - Dr Pennie Rushing  Review of Systems General:  Denies fatigue, malaise, and sleep disorder. Eyes:  Denies blurring and eye irritation. CV:  Complains of chest pain or discomfort; denies palpitations and shortness of breath with exertion; occ chest pain- takes nitro- keeps f/u with her cardiol  for this . Resp:  Complains of shortness of breath; denies cough, pleuritic, and wheezing. GI:  Denies abdominal pain, change in bowel habits, hemorrhoids, and indigestion. GU:  Denies  abnormal vaginal bleeding, discharge, and dysuria. MS:  Complains of low back pain and stiffness; denies joint pain, joint redness, and joint swelling. Derm:  Complains of lesion(s); denies rash. Neuro:  Denies numbness, tingling, and weakness. Psych:  Denies anxiety and depression. Endo:  Denies excessive thirst and excessive urination. Heme:  Denies abnormal bruising and bleeding.  Physical Exam  General:  overweight but generally well appearing  Head:  normocephalic, atraumatic, and no abnormalities observed.   Eyes:  vision grossly intact, pupils equal, pupils round, and pupils reactive to light.  no conjunctival pallor, injection or icterus  Ears:  R ear normal and L ear normal.   Nose:  no nasal discharge.   Mouth:  pharynx pink and moist.   Neck:  supple with full rom and no masses or thyromegally, no JVD or carotid bruit  Chest Wall:  No deformities, masses, or tenderness noted. Lungs:  CTA with diffuslely distant bs and no crackles or wheeze  Heart:  Normal rate and regular rhythm. S1 and S2 normal without gallop, murmur, click, rub or other extra sounds. Abdomen:  Bowel sounds positive,abdomen soft and non-tender without masses, organomegaly or hernias noted. Msk:  No deformity or scoliosis noted of thoracic or lumbar spine.  poor rom low back Pulses:  R and L carotid,radial,femoral,dorsalis pedis and posterior tibial pulses are full and equal bilaterally Extremities:  No clubbing, cyanosis, edema, or deformity noted with normal full range of motion of all joints.   Neurologic:  sensation intact to light touch, gait normal, and DTRs symmetrical and normal.   Skin:  R leg- near knee .75 cm firm flesh colored raised lesion with scale slt tanned  dry skin  Cervical Nodes:  No lymphadenopathy noted Inguinal Nodes:  No significant adenopathy Psych:  normal affect, talkative and pleasant    Impression & Recommendations:  Problem # 1:  NEOPLASM, SKIN, UNCERTAIN BEHAVIOR  (ICD-238.2) Assessment New nevus on R leg that looks like old dermatofibroma - but now with scale and irritation other moles look b9 disc skin protection ref to derm Orders: Dermatology Referral (Derma)  Problem # 2:  COUGH (ICD-786.2) poss from reflux - worse lately also with some choking spells rec f/ u with GI  pt will make own appt  already on ppi  Problem # 3:  TOBACCO USE (ICD-305.1) Assessment: Unchanged discussed in detail risks of smoking, and possible outcomes including COPD, vascular dz, cancer and also respiratory infections/sinus problems  pt voiced understanding  Problem # 4:  HYPERTENSION (ICD-401.9) Assessment: Unchanged  this is stable without change  Her updated medication list for this problem includes:    Hydrochlorothiazide 50 Mg Tabs (Hydrochlorothiazide) .[name]... Take one by mouth daily    Carvedilol 6.25 Mg  Tabs (Carvedilol) .[name]... Take 1 tablet by mouth two times a day    Micardis 40 Mg Tabs (Telmisartan) .[name]... Take 1 tablet by mouth once a day  Orders: Venipuncture (21308) TLB-Lipid Panel (80061-LIPID) TLB-Renal Function Panel (80069-RENAL) TLB-CBC Platelet - w/Differential (85025-CBCD) TLB-TSH (Thyroid Stimulating Hormone) (84443-TSH) TLB-Hepatic/Liver Function Pnl (80076-HEPATIC)  BP today: 132/80 Prior BP: 136/74 (03/03/2010)  Labs Reviewed: K+: 4.1 (11/02/2009) Creat: : 0.9 (11/02/2009)   Chol: 131 (11/15/2009)   HDL: 45.20 (11/15/2009)   LDL: 55 (11/15/2009)   TG: 153.0 (11/15/2009)  Problem # 5:  HYPERLIPIDEMIA (ICD-272.4) Assessment: Unchanged  labs today has been stable on statin  goal LDL under 70 with coronary artery disease and smoking Her updated medication list for this problem includes:    Crestor 10 Mg Tabs (Rosuvastatin calcium) .[name]... Take one by mouth daily  Orders: Venipuncture (65784) TLB-Lipid Panel (80061-LIPID) TLB-Renal Function Panel (80069-RENAL) TLB-CBC Platelet - w/Differential (85025-CBCD) TLB-TSH (Thyroid  Stimulating Hormone) (84443-TSH) TLB-Hepatic/Liver Function Pnl (80076-HEPATIC)  Labs Reviewed: SGOT: 22 (11/15/2009)   SGPT: 31 (11/15/2009)   HDL:45.20 (11/15/2009), 37.70 (05/18/2009)  LDL:55 (11/15/2009), 46 (05/18/2009)  Chol:131 (11/15/2009), 113 (05/18/2009)  Trig:153.0 (11/15/2009), 149.0 (05/18/2009)  Complete Medication List: 1)  Spiriva Handihaler 18 Mcg Caps (Tiotropium bromide monohydrate) .... One puff once daily 2)  Singulair 10 Mg Tabs (Montelukast sodium) .... Take one by mouth daily 3)  Albuterol Sulfate (2.5 Mg/67ml) 0.083% Nebu (Albuterol sulfate) .... Use one vial in neb every 4 hours as needed 4)  Ventolin Hfa 108 (90 Base) Mcg/act Aers (Albuterol sulfate) .... 2 puffs up to every 4 hours as needed wheeze 5)  Clarinex 5 Mg Tabs (Desloratadine) .[name].. 1 by mouth once daily 6)  Hydrochlorothiazide 50 Mg Tabs (Hydrochlorothiazide) .... Take one by mouth daily 7)  Potassium Chloride Cr 10 Meq Cpcr (Potassium chloride) .... Take one by mouth two times a day 8)  Crestor 10 Mg Tabs (Rosuvastatin calcium) .... Take one by mouth daily 9)  Janumet 50-1000 Mg Tabs (Sitagliptin-metformin hcl) .... Take 1 tablet by mouth two times a day 10)  Plavix 75 Mg Tabs (Clopidogrel bisulfate) .... Take one by mouth daily 11)  Imdur 60 Mg Xr24h-tab (Isosorbide mononitrate) .... Take by mouth two times a day 12)  Aspirin 325 Mg Tabs (Aspirin) .... Take one by mouth daily with food 13)  Amaryl 2 Mg Tabs (Glimepiride) .... Take 1 tablet by mouth two times a day 14)  Mucinex 600 Mg Tb12 (Guaifenesin) .... 2 by mouth two times a day 15)  Omeprazole 20 Mg Cpdr (Omeprazole) .[name].. 1 twice a day 30 minutes before meals. 16)  Carvedilol 6.25 Mg Tabs (Carvedilol) .... Take 1 tablet by mouth two times a day 17)  Nitrolingual 0.4 Mg/spray Tl Soln (Nitroglycerin) .... Use as directed 18)  Flexeril 10 Mg Tabs (Cyclobenzaprine hcl) .... One by mouth at bedtime as needed 19)  Neurontin 600 Mg Tabs (Gabapentin)  .... As directed 20)  Ranexa 500 Mg Xr12h-tab (Ranolazine) .... Take 1 tablet by mouth two times a day 21)  Micardis 40 Mg Tabs (Telmisartan) .... Take 1 tablet by mouth once a day 22)  Wellbutrin Xl 150 Mg Xr24h-tab (Bupropion hcl) .[name].. 1 by mouth once daily in am 23)  Flonase 50 Mcg/act Susp (Fluticasone propionate) .... Two sprays once daily 24)  Wellbutrin Xl 300 Mg Xr24h-tab (Bupropion hcl) .... Take 1 tablet by mouth once a day 25)  Fish Oil 1000 Mg Caps (Omega-3 fatty acids) .... Take 2 capsules  by  mouth once a day 26)  Buspirone Hcl 15 Mg Tabs (Buspirone hcl) .... One tablet by mouth twice a day  Patient Instructions: 1)  the current recommendation for calcium intake is 1200-1500 mg daily with 1000 IU of vitamin D  2)  don't forget to make appt with Dr Russella Dar for your worsening reflux 3)  we will do derm referral at check out  4)  labs today 5)  no change in medicines   Current Allergies (reviewed today): TETRACYCLINE ACE INHIBITORS * EGGS   Preventive Care Screening  Pap Smear:    Date:  05/04/2010    Results:  normal

## 2010-10-31 NOTE — Assessment & Plan Note (Signed)
Summary: ROA- F/U ON CHEST XRAY  CYD   Vital Signs:  Patient profile:   55 year old female Height:      57 inches Weight:      169 pounds BMI:     36.70 Temp:     98.4 degrees F oral Pulse rate:   106 / minute Pulse rhythm:   regular BP sitting:   130 / 70  (left arm) Cuff size:   regular  Vitals Entered By: Liane Comber (December 14, 2008 10:23 AM)  History of Present Illness: was seen 2/22 for chest congestion/ exac copd  given pred for 5 d and amox called back- no better -- and called in z pak   now is getting better  still hoarse voice  coughing up - more phlegm - esp after nebs  is grey in color  wheeze is off and on   keeps trying to quit smoking -- patch on and off    Dr Talmage Nap is suspicious of poss goiter  is going to do [place] next time   Allergies: 1)  Tetracycline 2)  Ace Inhibitors 3)  * Eggs  Past History:  Past Medical History:    Allergic rhinitis    Anxiety    tabacco abuse     COPD / emphysema    Coronary artery disease- CABG    cva     Depression    Diabetes mellitus, type II    GERD / esophagitis    Hyperlipidemia    Hypertension    Obstuctive Sleep Apnea    opthy- [facility]    ortho- [facility]     endo- Talmage Nap     psych (11/04/2008)  Past Surgical History:    Appendectomy    Cholecystectomy    Tonsillectomy    Pelvic US- left ovarian cyst    CT- head- old infarction (11/1997)    Endometrial biopsy- neg (DUB)      Cervical fusion x 3    Left foot surgry- plantar fasciitis    Exp. laparoscopy- intestinal obstruction    Carotid doppler- left stenosis    Dexa- normal (11/2002)    Abd US- echodensity (01/2003)   worse (12/2006)    Left arm fracture (07/2004)    Cardiolite- pos (07/2006)    Cath- stent LCA, OM (08/2006)    EGD - erosive esophagitis (01/2007)    colonoscopy (6/09)- hyperplastic polyp and hemorrhoids     (03/29/2008)  Family History:    Father: MI's, HTN, skin cancer    Mother: MI's, ? CVA's    Siblings: 1 sister with  HTN     (03/26/2007)  Social History:    Marital Status: Married    Children: none    Occupation: disability    Current Smoker     (11/22/2008)  Review of Systems General:  Denies fatigue, fever, loss of appetite, and malaise. Eyes:  Denies blurring, discharge, and eye pain. CV:  Denies chest pain or discomfort, lightheadness, palpitations, and shortness of breath with exertion. Resp:  Complains of cough, sputum productive, and wheezing; denies pleuritic. GI:  Denies abdominal pain and change in bowel habits. Derm:  Denies itching and rash. Psych:  is stressed . Heme:  Denies enlarge lymph nodes.  Physical Exam  General:  overweight but generally well appearing  Head:  normocephalic, atraumatic, and no abnormalities observed.   Eyes:  vision grossly intact, pupils equal, pupils round, and pupils reactive to light.  no conjunctival pallor, injection or icterus  Ears:  R ear normal and L ear normal.   Nose:  no nasal discharge.   Mouth:  pharynx pink and moist.   Neck:  supple with full rom and no masses or thyromegally, no JVD or carotid bruit  Chest Wall:  No deformities, masses, or tenderness noted. Lungs:  CTA with interval imp in wheezing no rales or rhonchi , no crackles diffusely distant bs Heart:  Normal rate and regular rhythm. S1 and S2 normal without gallop, murmur, click, rub or other extra sounds. Abdomen:  soft and non-tender.   Skin:  Intact without suspicious lesions or rashes Cervical Nodes:  No lymphadenopathy noted Psych:  normal affect, talkative and pleasant    Impression & Recommendations:  Problem # 1:  BRONCHITIS- ACUTE (ICD-466.0) Assessment Improved with exac copd now imp with zithromax/ s/p course of prednisone will finish that and refil cough med update if worse or not imp in 1 week or if sob work on quitting smoking The following medications were removed from the medication list:    Zithromax Z-pak 250 Mg Tabs (Azithromycin) .[name]... Take by  mouth as directed Her updated medication list for this problem includes:    Singulair 10 Mg Tabs (Montelukast sodium) .[name]... Take one by mouth daily    Mucinex 600 Mg Tb12 (Guaifenesin) .[name]... 2 by mouth two times a day    Ventolin Hfa 108 (90 Base) Mcg/act Aers (Albuterol sulfate) .[name]... 2 puffs up to every 4 hours as needed wheeze    Hydrocodone-homatropine 5-1.5 Mg/46ml Syrp (Hydrocodone-homatropine) .[name]... 1-2 teaspoons at bedtime as needed for severe cough    Albuterol Sulfate (2.5 Mg/52ml) 0.083% Nebu (Albuterol sulfate) ..... Use one vial in neb every 4 hours as needed  Problem # 2:  TOBACCO USE (ICD-305.1) Assessment: Unchanged again - disc imp of tab cessation for overall health  discussed in detail risks of smoking, and possible outcomes including COPD, vascular dz, cancer and also respiratory infections/sinus problems   Complete Medication List: 1)  Hydrochlorothiazide 50 Mg Tabs (Hydrochlorothiazide) .... Take one by mouth daily 2)  Potassium Chloride Cr 10 Meq Cpcr (Potassium chloride) .... Take one by mouth bid 3)  Singulair 10 Mg Tabs (Montelukast sodium) .... Take one by mouth daily 4)  Crestor 10 Mg Tabs (Rosuvastatin calcium) .... Take one by mouth daily 5)  Janumet 50-1000 Mg Tabs (Sitagliptin-metformin hcl) .... Take 1 tablet by mouth two times a day 6)  Plavix 75 Mg Tabs (Clopidogrel bisulfate) .... Take one by mouth daily 7)  Imdur 60 Mg Xr24h-tab (Isosorbide mononitrate) .... Take by mouth two times a day 8)  Aspirin 325 Mg Tabs (Aspirin) .... Take one by mouth daily 9)  Amaryl 4 Mg Tabs (Glimepiride) .... One by mouth once daily 10)  Mucinex 600 Mg Tb12 (Guaifenesin) .... 2 by mouth two times a day 11)  Omeprazole 20 Mg Cpdr (Omeprazole) .[name].. 1 twice a day 30 minutes before meals 12)  Carvedilol 3.125 Mg Tabs (Carvedilol) .... One by mouth two times a day 13)  Nitrolingual 0.4 Mg/spray Tl Soln (Nitroglycerin) .... Use as directed 14)  Propoxyphene N-apap 100-650 Mg Tabs  (Propoxyphene n-apap) .... One by mouth two times a day as needed 15)  Flexeril 10 Mg Tabs (Cyclobenzaprine hcl) .... One by mouth at bedtime 16)  Ventolin Hfa 108 (90 Base) Mcg/act Aers (Albuterol sulfate) .... 2 puffs up to every 4 hours as needed wheeze 17)  Hydrocodone-homatropine 5-1.5 Mg/53ml Syrp (Hydrocodone-homatropine) .[name].. 1-2 teaspoons at bedtime as needed for severe cough  18)  Albuterol Sulfate (2.5 Mg/16ml) 0.083% Nebu (Albuterol sulfate) .... Use one vial in neb every 4 hours as needed 19)  Neurontin 600 Mg Tabs (Gabapentin) .... As directed  Patient Instructions: 1)  continue cough med as needed  2)  drink lots of fluids  3)  finish antibiotic 4)  update me if worse or not further improved in 1 week  5)  keep working on quitting smoking  6)  The medication list was reviewed and reconciled.  All changed / newly prescribed medications were explained.  A complete medication list was provided to the patient / caregiver. Prescriptions: HYDROCODONE-HOMATROPINE 5-1.5 MG/5ML SYRP (HYDROCODONE-HOMATROPINE) 1-2 teaspoons at bedtime as needed for severe cough  #8 oz x 0   Entered and Authorized by:   Judith Part MD   Signed by:   Judith Part MD on 12/14/2008   Method used:   Print then Give to Patient   RxID:   [id number]       Prior Medications (reviewed today): HYDROCHLOROTHIAZIDE 50 MG  TABS (HYDROCHLOROTHIAZIDE) take one by mouth daily POTASSIUM CHLORIDE CR 10 MEQ  CPCR (POTASSIUM CHLORIDE) take one by mouth bid SINGULAIR 10 MG  TABS (MONTELUKAST SODIUM) take one by mouth daily CRESTOR 10 MG  TABS (ROSUVASTATIN CALCIUM) take one by mouth daily JANUMET 50-1000 MG TABS (SITAGLIPTIN-METFORMIN HCL) Take 1 tablet by mouth two times a day PLAVIX 75 MG  TABS (CLOPIDOGREL BISULFATE) take one by mouth daily IMDUR 60 MG  XR24H-TAB (ISOSORBIDE MONONITRATE) take by mouth two times a day ASPIRIN 325 MG  TABS (ASPIRIN) take one by mouth daily AMARYL 4 MG  TABS (GLIMEPIRIDE)  one by mouth once daily MUCINEX 600 MG  TB12 (GUAIFENESIN) 2 by mouth two times a day OMEPRAZOLE 20 MG  CPDR (OMEPRAZOLE) 1 twice a day 30 minutes before meals CARVEDILOL 3.125 MG  TABS (CARVEDILOL) one by mouth two times a day NITROLINGUAL 0.4 MG/SPRAY TL SOLN (NITROGLYCERIN) use as directed PROPOXYPHENE N-APAP 100-650 MG  TABS (PROPOXYPHENE N-APAP) one by mouth two times a day as needed FLEXERIL 10 MG  TABS (CYCLOBENZAPRINE HCL) one by mouth at bedtime VENTOLIN HFA 108 (90 BASE) MCG/ACT  AERS (ALBUTEROL SULFATE) 2 puffs up to every 4 hours as needed wheeze HYDROCODONE-HOMATROPINE 5-1.5 MG/5ML SYRP (HYDROCODONE-HOMATROPINE) 1-2 teaspoons at bedtime as needed for severe cough ALBUTEROL SULFATE (2.5 MG/3ML) 0.083% NEBU (ALBUTEROL SULFATE) use one vial in neb every 4 hours as needed NEURONTIN 600 MG TABS (GABAPENTIN) as directed Current Allergies (reviewed today): TETRACYCLINE ACE INHIBITORS * EGGS Current Medications (including changes made in today's visit):  HYDROCHLOROTHIAZIDE 50 MG  TABS (HYDROCHLOROTHIAZIDE) take one by mouth daily POTASSIUM CHLORIDE CR 10 MEQ  CPCR (POTASSIUM CHLORIDE) take one by mouth bid SINGULAIR 10 MG  TABS (MONTELUKAST SODIUM) take one by mouth daily CRESTOR 10 MG  TABS (ROSUVASTATIN CALCIUM) take one by mouth daily JANUMET 50-1000 MG TABS (SITAGLIPTIN-METFORMIN HCL) Take 1 tablet by mouth two times a day PLAVIX 75 MG  TABS (CLOPIDOGREL BISULFATE) take one by mouth daily IMDUR 60 MG  XR24H-TAB (ISOSORBIDE MONONITRATE) take by mouth two times a day ASPIRIN 325 MG  TABS (ASPIRIN) take one by mouth daily AMARYL 4 MG  TABS (GLIMEPIRIDE) one by mouth once daily MUCINEX 600 MG  TB12 (GUAIFENESIN) 2 by mouth two times a day OMEPRAZOLE 20 MG  CPDR (OMEPRAZOLE) 1 twice a day 30 minutes before meals CARVEDILOL 3.125 MG  TABS (CARVEDILOL) one by mouth two times a day NITROLINGUAL 0.4 MG/SPRAY TL  SOLN (NITROGLYCERIN) use as directed PROPOXYPHENE N-APAP 100-650 MG  TABS  (PROPOXYPHENE N-APAP) one by mouth two times a day as needed FLEXERIL 10 MG  TABS (CYCLOBENZAPRINE HCL) one by mouth at bedtime VENTOLIN HFA 108 (90 BASE) MCG/ACT  AERS (ALBUTEROL SULFATE) 2 puffs up to every 4 hours as needed wheeze HYDROCODONE-HOMATROPINE 5-1.5 MG/5ML SYRP (HYDROCODONE-HOMATROPINE) 1-2 teaspoons at bedtime as needed for severe cough ALBUTEROL SULFATE (2.5 MG/3ML) 0.083% NEBU (ALBUTEROL SULFATE) use one vial in neb every 4 hours as needed NEURONTIN 600 MG TABS (GABAPENTIN) as directed

## 2010-10-31 NOTE — Assessment & Plan Note (Signed)
Summary: rov/jd   Copy to:  n/a Primary Provider/Referring Provider:  [name], MD  CC:  4 month follow up.  Pt states there has been no changes in breathing.  States she does cough "a lot" but relates this to sinus drainage.  States the cough is occ prod with a small amount to thick white mucus.  Occ wheezing.  Denies chest tightness.  Pt states she is wearing cpap approx 4 nights a wk for approx 4 hours each night.  Denies problems with pressure and mask.  [name]  History of Present Illness: 55 yo female with dyspnea, OSA on CPAP 9 cm, and tobacco abuse.  She has noticed more sinus congestion and post-nasal drip.  This is causing a tickle in her throat.  She has been getting a dry cough, and gags at times.  She has a globus sensation.  She is not having wheeze, fever, or hemoptysis.  Her heartburn is controlled.  She still gets winded easily with activity.  She continues to smoke one pack per day.  She has not been using her ventolin or nebulizer much.  She has not been using any sinus sprays.  She feels that CPAP helps her sleep, but is only using it for about 4 hours a night.  She will go to the bathroom in the night, and forget to put her mask back on.  She will also sometimes fall asleep on the cough, and not use her CPAP.    Current Medications (verified): 1)  Spiriva Handihaler 18 Mcg Caps (Tiotropium Bromide Monohydrate) .... One Puff Once Daily 2)  Singulair 10 Mg  Tabs (Montelukast Sodium) .... Take One By Mouth Daily 3)  Albuterol Sulfate (2.5 Mg/21ml) 0.083% Nebu (Albuterol Sulfate) .... Use One Vial in [place] Every 4 Hours As Needed 4)  Ventolin Hfa 108 (90 Base) Mcg/act  Aers (Albuterol Sulfate) .... 2 Puffs Up To Every 4 Hours As Needed Wheeze 5)  Clarinex 5 Mg Tabs (Desloratadine) .[name].. 1 By Mouth Once Daily 6)  Hydrochlorothiazide 50 Mg  Tabs (Hydrochlorothiazide) .... Take One By Mouth Daily 7)  Potassium Chloride Cr 10 Meq  Cpcr (Potassium Chloride) .... Take One By Mouth Two  Times A Day 8)  Crestor 10 Mg  Tabs (Rosuvastatin Calcium) .... Take One By Mouth Daily 9)  Janumet 50-1000 Mg Tabs (Sitagliptin-Metformin Hcl) .... Take 1 Tablet By Mouth Two Times A Day 10)  Plavix 75 Mg  Tabs (Clopidogrel Bisulfate) .... Take One By Mouth Daily 11)  Imdur 60 Mg  Xr24h-Tab (Isosorbide Mononitrate) .... Take By Mouth Two Times A Day 12)  Aspirin 325 Mg  Tabs (Aspirin) .... Take One By Mouth Daily With Food 13)  Amaryl 2 Mg Tabs (Glimepiride) .... Take 1 Tablet By Mouth Two Times A Day 14)  Mucinex 600 Mg  Tb12 (Guaifenesin) .... 2 By Mouth Two Times A Day 15)  Omeprazole 20 Mg  Cpdr (Omeprazole) .[name].. 1 Twice A Day 30 Minutes Before Meals. 16)  Carvedilol 6.25 Mg Tabs (Carvedilol) .... Take 1 Tablet By Mouth Two Times A Day 17)  Nitrolingual 0.4 Mg/spray Tl Soln (Nitroglycerin) .... Use As Directed 18)  Flexeril 10 Mg  Tabs (Cyclobenzaprine Hcl) .... One By Mouth At Bedtime As Needed 19)  Neurontin 600 Mg Tabs (Gabapentin) .... As Directed 20)  Ranexa 500 Mg Xr12h-Tab (Ranolazine) .... Take 1 Tablet By Mouth Two Times A Day 21)  Micardis 40 Mg Tabs (Telmisartan) .... Take 1 Tablet By Mouth Once A Day  22)  Wellbutrin Xl 150 Mg Xr24h-Tab (Bupropion Hcl) .[name].. 1 By Mouth Once Daily in Am  Allergies (verified): 1)  Tetracycline 2)  Ace Inhibitors 3)  * Eggs  Past History:  Past Medical History: Allergic rhinitis Anxiety Tobacco abuse       - PFT 11/02/09 FEV1 1.85(95%), FEV1% 82, TLC 3.98 (101), DLCO 59%, no BD Coronary artery disease- CABG CVA  Depression Diabetes mellitus, type II GERD / erosive esophagitis Hyperlipidemia Hypertension Obstuctive Sleep Apnea      - PSG 04/10/06 RDI 105      - CPAP 9 cm H2O Hyperplastic colon polyps Internal hemorrhoids Goiter with thyroid nodules- watched by Dr Talmage Nap   opthyHyacinth Meeker ortho- [facility]  endo- [facility]  pulm- Domanick Cuccia psych  CT of Chest  Procedure date:  11/03/2009  Findings:      Comparison: None   Findings:  Mild prominence of pulmonary interlobular septi are seen mainly in the upper lung zones, without evidence of alveolitis, honeycombing, or bronchiectasis.  No definite emphysematous changes noted.   Scarring is seen in the lingula.  No suspicious pulmonary nodules or masses are identified.  There is no evidence of pleural or pericardial effusion.   No hilar or mediastinal adenopathy identified.  No adenopathy elsewhere within the thorax.  Previous coronary bypass grafting noted.  Diffuse hepatic steatosis incidentally noted.   IMPRESSION:   1.  Mild nonspecific pulmonary interstitial prominence, with upper lobe predominance.  No evidence of associated acute alveolitis, interstitial fibrosis, emphysematous changes, or bronchiectasis. 2.  No active disease. 3.  Hepatic steatosis.   Vital Signs:  Patient profile:   55 year old female Height:      57 inches Weight:      167 pounds BMI:     36.27 O2 Sat:      98 % on Room air Temp:     97.6 degrees F oral Pulse rate:   106 / minute BP sitting:   136 / 74  (left arm) Cuff size:   regular  Vitals Entered By: Gweneth Dimitri RN (March 03, 2010 11:34 AM)  O2 Flow:  Room air CC: 4 month follow up.  Pt states there has been no changes in breathing.  States she does cough "a lot" but relates this to sinus drainage.  States the cough is occ prod with a small amount to thick white mucus.  Occ wheezing.  Denies chest tightness.  Pt states she is wearing cpap approx 4 nights a wk for approx 4 hours each night.  Denies problems with pressure and mask.   Comments Medications reviewed with patient Daytime contact number verified with patient. Gweneth Dimitri RN  March 03, 2010 11:38 AM    Physical Exam  General:  obese.   Nose:  clear nasal discharge.   Mouth:  MP 3, no oral lesions Neck:  no JVD.   Lungs:  diminished breath sounds, faint wheezing clear with cough, no rales Heart:  Regular rate and rhythm; no murmurs, rubs,  or  bruits. Extremities:  no clubbing, cyanosis, edema, or deformity noted Cervical Nodes:  No lymphadenopathy noted   Impression & Recommendations:  Problem # 1:  OBSTRUCTIVE SLEEP APNEA (ICD-327.23) She has done well with CPAP.  Advised her to use this on a more regular basis.  Problem # 2:  TOBACCO USE (ICD-305.1) Again emphasized the importance of smoking cessation, and offered assistance with this.  Problem # 3:  COUGH (ICD-786.2) She has chronic cough.  Her  PFT from Feb. 2011 did not show evidence for airflow obstruction.  She has rhinitis with post-nasal drip, and reflux.  Most of her current symptoms seem to be related to her upper airway.  She does feel symptomatic improvement with inhaler therapy, so will continue for now.  Problem # 4:  ALLERGIC RHINITIS (ICD-477.9)  I think a good portion of her hoarseness is from her sinus congestion and post-nasal drip.  Will adjust her sinus regimen.  Problem # 5:  ABNORMAL CHEST XRAY (ICD-793.1) She had vague interstitial thickening on CT chest from February 2011.  Will repeat chest xray today, and decide if any further interventions are needed.  Medications Added to Medication List This Visit: 1)  Carvedilol 6.25 Mg Tabs (Carvedilol) .... Take 1 tablet by mouth two times a day 2)  Flonase 50 Mcg/act Susp (Fluticasone propionate) .... Two sprays once daily  Complete Medication List: 1)  Spiriva Handihaler 18 Mcg Caps (Tiotropium bromide monohydrate) .... One puff once daily 2)  Singulair 10 Mg Tabs (Montelukast sodium) .... Take one by mouth daily 3)  Albuterol Sulfate (2.5 Mg/89ml) 0.083% Nebu (Albuterol sulfate) .... Use one vial in neb every 4 hours as needed 4)  Ventolin Hfa 108 (90 Base) Mcg/act Aers (Albuterol sulfate) .... 2 puffs up to every 4 hours as needed wheeze 5)  Clarinex 5 Mg Tabs (Desloratadine) .[name].. 1 by mouth once daily 6)  Hydrochlorothiazide 50 Mg Tabs (Hydrochlorothiazide) .... Take one by mouth daily 7)  Potassium  Chloride Cr 10 Meq Cpcr (Potassium chloride) .... Take one by mouth two times a day 8)  Crestor 10 Mg Tabs (Rosuvastatin calcium) .... Take one by mouth daily 9)  Janumet 50-1000 Mg Tabs (Sitagliptin-metformin hcl) .... Take 1 tablet by mouth two times a day 10)  Plavix 75 Mg Tabs (Clopidogrel bisulfate) .... Take one by mouth daily 11)  Imdur 60 Mg Xr24h-tab (Isosorbide mononitrate) .... Take by mouth two times a day 12)  Aspirin 325 Mg Tabs (Aspirin) .... Take one by mouth daily with food 13)  Amaryl 2 Mg Tabs (Glimepiride) .... Take 1 tablet by mouth two times a day 14)  Mucinex 600 Mg Tb12 (Guaifenesin) .... 2 by mouth two times a day 15)  Omeprazole 20 Mg Cpdr (Omeprazole) .[name].. 1 twice a day 30 minutes before meals. 16)  Carvedilol 6.25 Mg Tabs (Carvedilol) .... Take 1 tablet by mouth two times a day 17)  Nitrolingual 0.4 Mg/spray Tl Soln (Nitroglycerin) .... Use as directed 18)  Flexeril 10 Mg Tabs (Cyclobenzaprine hcl) .... One by mouth at bedtime as needed 19)  Neurontin 600 Mg Tabs (Gabapentin) .... As directed 20)  Ranexa 500 Mg Xr12h-tab (Ranolazine) .... Take 1 tablet by mouth two times a day 21)  Micardis 40 Mg Tabs (Telmisartan) .... Take 1 tablet by mouth once a day 22)  Wellbutrin Xl 150 Mg Xr24h-tab (Bupropion hcl) .[name].. 1 by mouth once daily in am 23)  Flonase 50 Mcg/act Susp (Fluticasone propionate) .... Two sprays once daily  Other Orders: T-2 View CXR (71020TC) Est. Patient Level III (04540) Prescription Created Electronically [id number]) Tobacco use cessation intermediate 3-10 minutes (14782)  Patient Instructions: 1)  Use nasal irrigation (saline nasal spray) once daily  2)  Flonase two sprays once daily  3)  Chest xray today 4)  Follow up in 4 months Prescriptions: FLONASE 50 MCG/ACT SUSP (FLUTICASONE PROPIONATE) two sprays once daily  #1 x 3   Entered and Authorized by:   Coralyn Helling MD  Signed by:   Coralyn Helling MD on 03/03/2010   Method used:   [facility]         [facility] [address] [phone]* (retail)       [address]       [place], [place]  [address]       Ph: [phone] or [phone]       Fax: [phone]   RxID:   [id number]

## 2010-10-31 NOTE — Letter (Signed)
Summary: [facility]   Imported By: Maryln Gottron 04/07/2009 15:49:33  _____________________________________________________________________  External Attachment:    Type:   Image     Comment:   External Document

## 2010-10-31 NOTE — Letter (Signed)
Summary: [facility]   Imported By: Sherian Rein 07/13/2010 08:23:50  _____________________________________________________________________  External Attachment:    Type:   Image     Comment:   External Document

## 2010-10-31 NOTE — Miscellaneous (Signed)
Summary: auto CPAP download   Clinical Lists Changes From Jul 18, 2008 to Nov 08, 2009.  Optimal pressure 9 cm with average AHI 3.  Minimal leak.    d/w patient, and advised to be more consistent with use of CPAP.

## 2010-10-31 NOTE — Progress Notes (Signed)
Summary: refill ventolin  Phone Note Refill Request Message from:  [employer] on April 26, 2009 11:53 AM  Refills Requested: Medication #1:  VENTOLIN HFA 108 (90 BASE) MCG/ACT  AERS 2 puffs up to every 4 hours as needed wheeze   [facility] Ref[facility]: [facility] [address]  [phone]   Method Requested: Electronic Initial call taken by: Providence Crosby LPN,  April 26, 2009 11:54 AM  Follow-up for Phone Call        px written on EMR for call in  Follow-up by: Judith Part MD,  April 26, 2009 1:28 PM    Prescriptions: VENTOLIN HFA 108 (90 BASE) MCG/ACT  AERS (ALBUTEROL SULFATE) 2 puffs up to every 4 hours as needed wheeze  #1 mdi x 11   Entered by:   Liane Comber CMA   Authorized by:   Judith Part MD   Signed by:   Liane Comber CMA on 04/26/2009   Method used:   [facility]        [facility] [address] [phone]* (retail)       [address]       [place], [place]  [address]       Ph: [phone] or [phone]       Fax: [phone]   RxID:   [id number] VENTOLIN HFA 108 (90 BASE) MCG/ACT  AERS (ALBUTEROL SULFATE) 2 puffs up to every 4 hours as needed wheeze  #1 mdi x 11   Entered and Authorized by:   Judith Part MD   Signed by:   Judith Part MD on 04/26/2009   Method used:   Telephoned to ...       [facility] [address] [phone]* (retail)       [address]       [place], [place]  [address]       Ph: [phone] or [phone]       Fax: [phone]   RxID:   [id number]

## 2010-10-31 NOTE — Progress Notes (Signed)
Summary: Rx HCTZ  Phone Note Refill Request Call back at [phone] Message from:  [facility] on January 24, 2010 3:40 PM  Refills Requested: Medication #1:  HYDROCHLOROTHIAZIDE 50 MG  TABS take one by mouth daily Received faxed refill request, fax in your IN box.    Method Requested: Fax to Local Pharmacy Initial call taken by: Linde Gillis CMA Duncan Dull),  January 24, 2010 3:40 PM  Follow-up for Phone Call        I did 90 with 3 ref in january -- ? why does she need it now  Follow-up by: Judith Part MD,  January 24, 2010 5:02 PM  Additional Follow-up for Phone Call Additional follow up Details #1::        [facility] and spoke with Pieter Partridge and she found refills from Jan 2011. Pieter Partridge said to disregard the fax.Lewanda Rife LPN  January 24, 2010 5:07 PM

## 2010-10-31 NOTE — Progress Notes (Signed)
Summary: nos appt  Phone Note Call from Patient   Caller: [email or web address]  Call For: Diane [name] Summary of Call: Rsc nos from 5/20 to 6/3 @ 11:30a. Initial call taken by: Darletta Moll,  Feb 20, 2010 10:10 AM

## 2010-10-31 NOTE — Progress Notes (Signed)
Summary: RX  Medications Added OMEPRAZOLE 20 MG  CPDR (OMEPRAZOLE) 1 twice a day 30 minutes before meals. MUST KEEP OFFICE VISIT FOR FURTHER REFILLS!       Phone Note Call from Patient Call back at [phone]   Caller: Patient Call For: Maryln Eastham  Reason for Call: Refill Medication, Talk to Nurse Summary of Call: has made 1st avail appt on 9/14 wondering if she can get enough refills on Omeprazole until then? [facility]  [address] [phone]* Initial call taken by: Guadlupe Spanish [facility],  May 17, 2009 12:29 PM  Follow-up for Phone Call        Rx sent. Follow-up by: Hortense Ramal CMA (AAMA),  May 17, 2009 2:00 PM    New/Updated Medications: OMEPRAZOLE 20 MG  CPDR (OMEPRAZOLE) 1 twice a day 30 minutes before meals. MUST KEEP OFFICE VISIT FOR FURTHER REFILLS! Prescriptions: OMEPRAZOLE 20 MG  CPDR (OMEPRAZOLE) 1 twice a day 30 minutes before meals. MUST KEEP OFFICE VISIT FOR FURTHER REFILLS!  #60 x 0   Entered by:   Hortense Ramal CMA (AAMA)   Authorized by:   Meryl Dare MD [facility]   Signed by:   Hortense Ramal CMA Duncan Dull) on 05/17/2009   Method used:   [facility]        [facility] [address] [phone]* (retail)       [address]       [place], [place]  [address]       Ph: [phone] or [phone]       Fax: [phone]   RxID:   [id number]

## 2010-10-31 NOTE — Assessment & Plan Note (Signed)
Summary: MED REFILLS/MK   Vital Signs:  Patient profile:   55 year old female Height:      57 inches Weight:      170 pounds BMI:     36.92 Temp:     98 degrees F oral Pulse rate:   84 / minute Pulse rhythm:   regular BP sitting:   120 / 80  (left arm) Cuff size:   regular  Vitals Entered By: Liane Comber CMA (May 18, 2009 12:37 PM)  History of Present Illness: here for f/u/ med refil is doing ok overall   just left ortho office- shot for trigger finger  got toradol and cortisone- back and shoulder pain -- a lot of chronic pain   otherwise keep going   chol well controlled - in feb Last Lipid ProfileCholesterol: 124 (11/04/2008 10:56:00 AM)HDL:  38.3 (11/04/2008 10:56:00 AM)LDL:  60 (11/04/2008 10:56:00 AM)Triglycerides:  182 (10/10/2006 12:54:00 PM)Last Liver profileSGOT:  33 (11/04/2008 10:56:00 AM)SPGT:  32 (11/04/2008 10:56:00 AM)T. Bili:  0.7 (04/21/2008 9:37:00 AM)Alk Phos:  87 (04/21/2008 9:37:00 AM)   wt is stable  bp is 120/80 - very good control  DM- sees Dr Talmage Nap last visit was good  she is following goiter as well -- is starting to give her symptoms -- with swallowing  is following labs - and not on med yet   went to cardiac rehab for a while -- helped a little  also had a stress test -- did better this time- is thrilled with that   had to switch from diovan to miacardis -- due to insurance probs-- is doing well on that  bp is actually better no headache/swelling or other symptoms  ran out of omeprazole - just called for refil indigestion/reflux symptoms returned -- is miserable no abd pain or stool changes is sched f/u with Dr Russella Dar  Allergies: 1)  Tetracycline 2)  Ace Inhibitors 3)  * Eggs  Past History:  Past Medical History: Last updated: 11/04/2008 Allergic rhinitis Anxiety tabacco abuse  COPD / emphysema Coronary artery disease- CABG cva  Depression Diabetes mellitus, type II GERD /  esophagitis Hyperlipidemia Hypertension Obstuctive Sleep Apnea  opthy- [facility]- [place]  endo- [facility]  psych  Past Surgical History: Last updated: 03/29/2008 Appendectomy Cholecystectomy Tonsillectomy Pelvic US- left ovarian cyst CT- head- old infarction (11/1997) Endometrial biopsy- neg (DUB)   Cervical fusion x 3 Left foot surgry- plantar fasciitis Exp. laparoscopy- intestinal obstruction Carotid doppler- left stenosis Dexa- normal (11/2002) Abd US- echodensity (01/2003)   worse (12/2006) Left arm fracture (07/2004) Cardiolite- pos (07/2006) Cath- stent LCA, OM (08/2006) EGD - erosive esophagitis (01/2007) colonoscopy (6/09)- hyperplastic polyp and hemorrhoids  Family History: Last updated: 03/26/2007 Father: MI's, HTN, skin cancer Mother: MI's, ? CVA's Siblings: 1 sister with HTN  Social History: Last updated: 11/22/2008 Marital Status: Married Children: none Occupation: disability Current Smoker  Risk Factors: Smoking Status: current (11/22/2008)  Review of Systems General:  Complains of fatigue; denies loss of appetite and malaise. Eyes:  Denies blurring and eye pain. CV:  Denies chest pain or discomfort, leg cramps with exertion, lightheadness, and palpitations. Resp:  Denies cough and wheezing. GI:  Complains of indigestion; denies abdominal pain, change in bowel habits, loss of appetite, and nausea. MS:  Complains of joint pain, low back pain, and mid back pain; denies muscle weakness. Derm:  Denies itching and rash. Neuro:  Denies numbness. Psych:  mood is fairly stable . Endo:  Denies excessive thirst and excessive urination. Heme:  Denies abnormal bruising and bleeding.  Physical Exam  General:  overweight but generally well appearing  Head:  normocephalic, atraumatic, and no abnormalities observed.   Eyes:  vision grossly intact, pupils equal, pupils round, and pupils reactive to light.  no conjunctival pallor, injection or icterus   Mouth:  pharynx pink and moist.   Neck:  supple with full rom and no masses or thyromegally, no JVD or carotid bruit  Lungs:  CTA with interval imp in wheezing no rales or rhonchi , no crackles diffusely distant bs Heart:  Normal rate and regular rhythm. S1 and S2 normal without gallop, murmur, click, rub or other extra sounds. Abdomen:  soft, non-tender, normal bowel sounds, no distention, no masses, no hepatomegaly, and no splenomegaly.  no renal bruits Msk:  No deformity or scoliosis noted of thoracic or lumbar spine.  poor rom spine/knees due to pain Pulses:  plus one pedal pulses  Extremities:  trace left pedal edema and trace right pedal edema.   Neurologic:  sensation intact to light touch, gait normal, and DTRs symmetrical and normal.   Skin:  Intact without suspicious lesions or rashes fair without pallor Cervical Nodes:  No lymphadenopathy noted Inguinal Nodes:  No significant adenopathy Psych:  pleasant and talkative today not overly anxious  Diabetes Management Exam:    Foot Exam (with socks and/or shoes not present):       Sensory-Pinprick/Light touch:          Left medial foot (L-4): normal          Left dorsal foot (L-5): normal          Left lateral foot (S-1): normal          Right medial foot (L-4): normal          Right dorsal foot (L-5): normal          Right lateral foot (S-1): normal       Sensory-Monofilament:          Left foot: normal          Right foot: normal       Inspection:          Left foot: normal          Right foot: normal       Nails:          Left foot: normal          Right foot: normal   Impression & Recommendations:  Problem # 1:  HYPERTENSION (ICD-401.9) Assessment Improved  bp is well controlled on current med- incl switch to miacardis lab today disc low sodium diet  exercise as tolerated Her updated medication list for this problem includes:    Hydrochlorothiazide 50 Mg Tabs (Hydrochlorothiazide) .[name]... Take one by mouth  daily    Carvedilol 3.125 Mg Tabs (Carvedilol) ..... One by mouth two times a day    Micardis 40 Mg Tabs (Telmisartan) .[name]... Take 1 tablet by mouth once a day  Orders: Venipuncture ([address]) TLB-Lipid Panel (80061-LIPID) TLB-Renal Function Panel (80069-RENAL) TLB-Hepatic/Liver Function Pnl (80076-HEPATIC) TLB-CBC Platelet - w/Differential (85025-CBCD)  BP today: 120/80 Prior BP: 130/70 (12/14/2008)  Labs Reviewed: K+: 4.8 (11/04/2008) Creat: : 0.7 (11/04/2008)   Chol: 124 (11/04/2008)   HDL: 38.3 (11/04/2008)   LDL: 60 (11/04/2008)   TG: 127 (11/04/2008)  Problem # 2:  HYPERLIPIDEMIA (ICD-272.4) Assessment: Unchanged  has been well controlled continue goal of LDL under 70 for CAD - and try to get HDL over 40  lab today disc low sat fat diet  Her updated medication list for this problem includes:    Crestor 10 Mg Tabs (Rosuvastatin calcium) .[name]... Take one by mouth daily  Orders: Venipuncture ([address]) TLB-Lipid Panel (80061-LIPID) TLB-Renal Function Panel (80069-RENAL) TLB-Hepatic/Liver Function Pnl (80076-HEPATIC) TLB-CBC Platelet - w/Differential (85025-CBCD)  Labs Reviewed: SGOT: 33 (11/04/2008)   SGPT: 32 (11/04/2008)   HDL:38.3 (11/04/2008), 34.6 (05/27/2008)  LDL:60 (11/04/2008), 77 (05/27/2008)  Chol:124 (11/04/2008), 151 (05/27/2008)  Trig:127 (11/04/2008), 197 (05/27/2008)  Problem # 3:  TOBACCO USE (ICD-305.1) Assessment: Unchanged discussed in detail risks of smoking, and possible outcomes including COPD, vascular dz, cancer and also respiratory infections/sinus problems  pt voiced understanding of risks as well as present copd -- states she will continue to try to quit   Problem # 4:  COPD (ICD-496) currently stable - smoking unchanged  using caution in heat  refil ventolin inhaler  utd pneumovax- will need flu shot in fall  again counseled on smoking cessation Her updated medication list for this problem includes:    Singulair 10 Mg Tabs (Montelukast sodium)  .[name]... Take one by mouth daily    Ventolin Hfa 108 (90 Base) Mcg/act Aers (Albuterol sulfate) .[name]... 2 puffs up to every 4 hours as needed wheeze    Albuterol Sulfate (2.5 Mg/79ml) 0.083% Nebu (Albuterol sulfate) ..... Use one vial in neb every 4 hours as needed  Problem # 5:  GERD (ICD-530.81) Assessment: Deteriorated urged pt to get back on omeprazole watch diet  f/u with GI as planned update if worse or any abd pain Her updated medication list for this problem includes:    Omeprazole 20 Mg Cpdr (Omeprazole) .[name]... 1 twice a day 30 minutes before meals. must keep office visit for further refills!  Orders: Venipuncture ([address]) TLB-Lipid Panel (80061-LIPID) TLB-Renal Function Panel (80069-RENAL) TLB-Hepatic/Liver Function Pnl (80076-HEPATIC) TLB-CBC Platelet - w/Differential (85025-CBCD)  Problem # 6:  DIABETES MELLITUS, TYPE II (ICD-250.00) Assessment: Comment Only per pt better control  f/u with Dr Talmage Nap as planned Her updated medication list for this problem includes:    Janumet 50-1000 Mg Tabs (Sitagliptin-metformin hcl) .[name]... Take 1 tablet by mouth two times a day    Aspirin 325 Mg Tabs (Aspirin) .[name]... Take one by mouth daily    Amaryl 4 Mg Tabs (Glimepiride) ..... One by mouth once daily    Micardis 40 Mg Tabs (Telmisartan) .[name]... Take 1 tablet by mouth once a day  Complete Medication List: 1)  Hydrochlorothiazide 50 Mg Tabs (Hydrochlorothiazide) .... Take one by mouth daily 2)  Potassium Chloride Cr 10 Meq Cpcr (Potassium chloride) .... Take one by mouth bid 3)  Singulair 10 Mg Tabs (Montelukast sodium) .... Take one by mouth daily 4)  Crestor 10 Mg Tabs (Rosuvastatin calcium) .... Take one by mouth daily 5)  Janumet 50-1000 Mg Tabs (Sitagliptin-metformin hcl) .... Take 1 tablet by mouth two times a day 6)  Plavix 75 Mg Tabs (Clopidogrel bisulfate) .... Take one by mouth daily 7)  Imdur 60 Mg Xr24h-tab (Isosorbide mononitrate) .... Take by mouth two times a day 8)  Aspirin 325  Mg Tabs (Aspirin) .... Take one by mouth daily 9)  Amaryl 4 Mg Tabs (Glimepiride) .... One by mouth once daily 10)  Mucinex 600 Mg Tb12 (Guaifenesin) .... 2 by mouth two times a day 11)  Omeprazole 20 Mg Cpdr (Omeprazole) .[name].. 1 twice a day 30 minutes before meals. must keep office visit for further refills! 12)  Carvedilol 3.125 Mg Tabs (Carvedilol) .... One  by mouth two times a day 13)  Nitrolingual 0.4 Mg/spray Tl Soln (Nitroglycerin) .... Use as directed 14)  Propoxyphene N-apap 100-650 Mg Tabs (Propoxyphene n-apap) .... One by mouth two times a day as needed 15)  Flexeril 10 Mg Tabs (Cyclobenzaprine hcl) .... One by mouth at bedtime 16)  Ventolin Hfa 108 (90 Base) Mcg/act Aers (Albuterol sulfate) .... 2 puffs up to every 4 hours as needed wheeze 17)  Hydrocodone-homatropine 5-1.5 Mg/42ml Syrp (Hydrocodone-homatropine) .[name].. 1-2 teaspoons at bedtime as needed for severe cough 18)  Albuterol Sulfate (2.5 Mg/29ml) 0.083% Nebu (Albuterol sulfate) .... Use one vial in neb every 4 hours as needed 19)  Neurontin 600 Mg Tabs (Gabapentin) .... As directed 20)  Clarinex 5 Mg Tabs (Desloratadine) .[name].. 1 by mouth once daily as needed allergy symptoms 21)  Ranexa 500 Mg Xr12h-tab (Ranolazine) .... Take 1 tablet by mouth two times a day 22)  Micardis 40 Mg Tabs (Telmisartan) .... Take 1 tablet by mouth once a day   Patient Instructions: 1)  keep thinking about quitting smoking  2)  keep working on healthy diet and exercise 3)  no change in medicines  4)  tetnus shot today  5)  follow up in about 6 months  Prescriptions: CLARINEX 5 MG TABS (DESLORATADINE) 1 by mouth once daily as needed allergy symptoms  #90 x 3   Entered and Authorized by:   Judith Part MD   Signed by:   Judith Part MD on 05/18/2009   Method used:   Print then Give to Patient   RxID:   [id number] VENTOLIN HFA 108 (90 BASE) MCG/ACT  AERS (ALBUTEROL SULFATE) 2 puffs up to every 4 hours as needed wheeze  #34mdi x 11    Entered and Authorized by:   Judith Part MD   Signed by:   Judith Part MD on 05/18/2009   Method used:   Print then Give to Patient   RxID:   [id number]   Prior Medications (reviewed today): HYDROCHLOROTHIAZIDE 50 MG  TABS (HYDROCHLOROTHIAZIDE) take one by mouth daily POTASSIUM CHLORIDE CR 10 MEQ  CPCR (POTASSIUM CHLORIDE) take one by mouth bid SINGULAIR 10 MG  TABS (MONTELUKAST SODIUM) take one by mouth daily CRESTOR 10 MG  TABS (ROSUVASTATIN CALCIUM) take one by mouth daily JANUMET 50-1000 MG TABS (SITAGLIPTIN-METFORMIN HCL) Take 1 tablet by mouth two times a day PLAVIX 75 MG  TABS (CLOPIDOGREL BISULFATE) take one by mouth daily IMDUR 60 MG  XR24H-TAB (ISOSORBIDE MONONITRATE) take by mouth two times a day ASPIRIN 325 MG  TABS (ASPIRIN) take one by mouth daily AMARYL 4 MG  TABS (GLIMEPIRIDE) one by mouth once daily MUCINEX 600 MG  TB12 (GUAIFENESIN) 2 by mouth two times a day OMEPRAZOLE 20 MG  CPDR (OMEPRAZOLE) 1 twice a day 30 minutes before meals. MUST KEEP OFFICE VISIT FOR FURTHER REFILLS! CARVEDILOL 3.125 MG  TABS (CARVEDILOL) one by mouth two times a day NITROLINGUAL 0.4 MG/SPRAY TL SOLN (NITROGLYCERIN) use as directed PROPOXYPHENE N-APAP 100-650 MG  TABS (PROPOXYPHENE N-APAP) one by mouth two times a day as needed FLEXERIL 10 MG  TABS (CYCLOBENZAPRINE HCL) one by mouth at bedtime HYDROCODONE-HOMATROPINE 5-1.5 MG/5ML SYRP (HYDROCODONE-HOMATROPINE) 1-2 teaspoons at bedtime as needed for severe cough ALBUTEROL SULFATE (2.5 MG/3ML) 0.083% NEBU (ALBUTEROL SULFATE) use one vial in neb every 4 hours as needed NEURONTIN 600 MG TABS (GABAPENTIN) as directed CLARINEX 5 MG TABS (DESLORATADINE) 1 by mouth once daily as needed allergy symptoms RANEXA 500 MG XR12H-TAB (  RANOLAZINE) Take 1 tablet by mouth two times a day MICARDIS 40 MG TABS (TELMISARTAN) Take 1 tablet by mouth once a day Current Allergies (reviewed today): TETRACYCLINE ACE INHIBITORS * EGGS Current Medications  (including changes made in today's visit):  HYDROCHLOROTHIAZIDE 50 MG  TABS (HYDROCHLOROTHIAZIDE) take one by mouth daily POTASSIUM CHLORIDE CR 10 MEQ  CPCR (POTASSIUM CHLORIDE) take one by mouth bid SINGULAIR 10 MG  TABS (MONTELUKAST SODIUM) take one by mouth daily CRESTOR 10 MG  TABS (ROSUVASTATIN CALCIUM) take one by mouth daily JANUMET 50-1000 MG TABS (SITAGLIPTIN-METFORMIN HCL) Take 1 tablet by mouth two times a day PLAVIX 75 MG  TABS (CLOPIDOGREL BISULFATE) take one by mouth daily IMDUR 60 MG  XR24H-TAB (ISOSORBIDE MONONITRATE) take by mouth two times a day ASPIRIN 325 MG  TABS (ASPIRIN) take one by mouth daily AMARYL 4 MG  TABS (GLIMEPIRIDE) one by mouth once daily MUCINEX 600 MG  TB12 (GUAIFENESIN) 2 by mouth two times a day OMEPRAZOLE 20 MG  CPDR (OMEPRAZOLE) 1 twice a day 30 minutes before meals. MUST KEEP OFFICE VISIT FOR FURTHER REFILLS! CARVEDILOL 3.125 MG  TABS (CARVEDILOL) one by mouth two times a day NITROLINGUAL 0.4 MG/SPRAY TL SOLN (NITROGLYCERIN) use as directed PROPOXYPHENE N-APAP 100-650 MG  TABS (PROPOXYPHENE N-APAP) one by mouth two times a day as needed FLEXERIL 10 MG  TABS (CYCLOBENZAPRINE HCL) one by mouth at bedtime VENTOLIN HFA 108 (90 BASE) MCG/ACT  AERS (ALBUTEROL SULFATE) 2 puffs up to every 4 hours as needed wheeze HYDROCODONE-HOMATROPINE 5-1.5 MG/5ML SYRP (HYDROCODONE-HOMATROPINE) 1-2 teaspoons at bedtime as needed for severe cough ALBUTEROL SULFATE (2.5 MG/3ML) 0.083% NEBU (ALBUTEROL SULFATE) use one vial in neb every 4 hours as needed NEURONTIN 600 MG TABS (GABAPENTIN) as directed CLARINEX 5 MG TABS (DESLORATADINE) 1 by mouth once daily as needed allergy symptoms RANEXA 500 MG XR12H-TAB (RANOLAZINE) Take 1 tablet by mouth two times a day MICARDIS 40 MG TABS (TELMISARTAN) Take 1 tablet by mouth once a day

## 2010-10-31 NOTE — Letter (Signed)
Summary: Results Follow up Letter  [facility]  [name] [place], [place] [address]   Phone: [phone]  Fax: [phone]    01/06/2010 MRN: [id number]    Annalena Thiemann [address] [place], [place]  [address]    Dear Ms. Soja,  The following are the results of your recent test(s):  Test         Result    Pap Smear:        Normal _____  Not Normal _____ Comments: ______________________________________________________ Cholesterol: LDL(Bad cholesterol):         Your goal is less than:         HDL (Good cholesterol):       Your goal is more than: Comments:  ______________________________________________________ Mammogram:        Normal __X___  Not Normal _____ Comments:  Yearly follow up is recommended.   ___________________________________________________________________ Hemoccult:        Normal _____  Not normal _______ Comments:    _____________________________________________________________________ Other Tests:    We routinely do not discuss normal results over the telephone.  If you desire a copy of the results, or you have any questions about this information we can discuss them at your next office visit.   Sincerely,    Marne A. Milinda Antis, M.D.  MAT:lsf

## 2010-10-31 NOTE — Progress Notes (Signed)
Summary: nos appt  Phone Note Call from Patient   Caller: [email or web address]  Call For: Sharline Lehane Summary of Call: Rsc from 5/20 nos to 5/31 @ 9a. Initial call taken by: Darletta Moll,  Feb 20, 2010 10:18 AM

## 2010-10-31 NOTE — Assessment & Plan Note (Signed)
Summary: 4 month follow up///JJ   Copy to:  Peter Swaziland Primary Provider/Referring Provider:  [name], MD  CC:  Pt here for 4 month follow-up. pt states she feels like she is having to take deeper breaths then usual. .  History of Present Illness: 55 yo female with cough, rhinitis, OSA on CPAP 9 cm, and tobacco abuse.  She feels like she needs to take deep breaths.  She still has a cough, but this is improved.  She feels that nasal irrigation has helped.  She occasionally brings up sputum, and this is clear to cloudy.  She denies hemoptysis or fever.  She gets occasional wheeze.  She continues to smoke 1.5 packs per day.  She uses her ventolin two times a day, and nebulizer once daily.  She is using her flonase once daily.  She has been seeing Dr. Swaziland for dizziness, and has been schedule for tests to further evaluate this.    She has been trying to use her CPAP.  She can only use it for about 2 to 3 hours per night.  She is having trouble getting use to the mask.  Preventive Screening-Counseling & Management  Alcohol-Tobacco     Alcohol drinks/day: 0     Smoking Status: current     Packs/Day: 1.5     Year Started: 1967  Current Medications (verified): 1)  Spiriva Handihaler 18 Mcg Caps (Tiotropium Bromide Monohydrate) .... One Puff Once Daily 2)  Singulair 10 Mg  Tabs (Montelukast Sodium) .... Take One By Mouth Daily 3)  Albuterol Sulfate (2.5 Mg/49ml) 0.083% Nebu (Albuterol Sulfate) .... Use One Vial in [place] Every 4 Hours As Needed 4)  Ventolin Hfa 108 (90 Base) Mcg/act  Aers (Albuterol Sulfate) .... 2 Puffs Up To Every 4 Hours As Needed Wheeze 5)  Clarinex 5 Mg Tabs (Desloratadine) .[name].. 1 By Mouth Once Daily 6)  Hydrochlorothiazide 50 Mg  Tabs (Hydrochlorothiazide) .... Take One By Mouth Daily 7)  Potassium Chloride Cr 10 Meq  Cpcr (Potassium Chloride) .... Take One By Mouth Two Times A Day 8)  Crestor 10 Mg  Tabs (Rosuvastatin Calcium) .... Take One By Mouth Daily 9)  Janumet  50-1000 Mg Tabs (Sitagliptin-Metformin Hcl) .... Take 1 Tablet By Mouth Two Times A Day 10)  Plavix 75 Mg  Tabs (Clopidogrel Bisulfate) .... Take One By Mouth Daily 11)  Imdur 60 Mg  Xr24h-Tab (Isosorbide Mononitrate) .... Take By Mouth Two Times A Day 12)  Aspirin 325 Mg  Tabs (Aspirin) .... Take One By Mouth Daily With Food 13)  Amaryl 2 Mg Tabs (Glimepiride) .... Take 1 Tablet By Mouth Two Times A Day 14)  Mucinex 600 Mg  Tb12 (Guaifenesin) .... 2 By Mouth Two Times A Day 15)  Omeprazole 20 Mg  Cpdr (Omeprazole) .[name].. 1 Twice A Day 30 Minutes Before Meals. 16)  Carvedilol 12.5 Mg Tabs (Carvedilol) .... Take 1 Tablet By Mouth Two Times A Day 17)  Nitrolingual 0.4 Mg/spray Tl Soln (Nitroglycerin) .... Use As Directed 18)  Flexeril 10 Mg  Tabs (Cyclobenzaprine Hcl) .... One By Mouth At Bedtime As Needed 19)  Neurontin 600 Mg Tabs (Gabapentin) .... As Directed 20)  Ranexa 500 Mg Xr12h-Tab (Ranolazine) .... Take 1 Tablet By Mouth Two Times A Day 21)  Micardis 40 Mg Tabs (Telmisartan) .... Take 1 Tablet By Mouth Once A Day 22)  Wellbutrin Xl 150 Mg Xr24h-Tab (Bupropion Hcl) .[name].. 1 By Mouth Once Daily in Am 23)  Flonase 50 Mcg/act Susp (  Fluticasone Propionate) .... Two Sprays Once Daily 24)  Fish Oil 1000 Mg Caps (Omega-3 Fatty Acids) .... Take 2 Capsules  By Mouth Once A Day 25)  Buspirone Hcl 15 Mg Tabs (Buspirone Hcl) .... One Tablet By Mouth Twice A Day  Allergies (verified): 1)  Tetracycline 2)  Ace Inhibitors 3)  * Eggs  Past History:  Past Medical History: Last updated: 05/11/2010 Allergic rhinitis Anxiety Tobacco abuse       - PFT 11/02/09 FEV1 1.85(95%), FEV1% 82, TLC 3.98 (101), DLCO 59%, no BD Coronary artery disease- CABG CVA  Depression Diabetes mellitus, type II GERD / erosive esophagitis Hyperlipidemia Hypertension Obstuctive Sleep Apnea      - PSG 04/10/06 RDI 105      - CPAP 9 cm H2O Hyperplastic colon polyps Internal hemorrhoids Goiter with thyroid nodules-  watched by Dr Talmage Nap   opthyHyacinth Meeker ortho- [place]  endo- [facility]  pulm- [facility] psych gyn - Dr Pennie Rushing  Past Surgical History: Last updated: 06/14/2009 Appendectomy Cholecystectomy Tonsillectomy Endometrial biopsy- neg (DUB)   Cervical fusion x 3 Left foot surgry- plantar fasciitis Exp. laparoscopy- intestinal obstruction Left arm fracture (07/2004)  Carotid doppler- left stenosis Dexa- normal (11/2002) Abd US- echodensity (01/2003)   worse (12/2006) Cardiolite- pos (07/2006) Cath- stent LCA, OM (08/2006) EGD - erosive esophagitis (01/2007) colonoscopy (6/09)- hyperplastic polyp and hemorrhoids Pelvic US- left ovarian cyst CT- head- old infarction (11/1997)  Vital Signs:  Patient profile:   55 year old female Height:      57 inches Weight:      172 pounds O2 Sat:      96 % on Room air Temp:     97.7 degrees F oral Pulse rate:   88 / minute BP sitting:   122 / 80  (right arm) Cuff size:   regular  Vitals Entered By: Carron Curie CMA (July 13, 2010 1:33 PM)  O2 Flow:  Room air CC: Pt here for 4 month follow-up. pt states she feels like she is having to take deeper breaths then usual.  Comments Medications reviewed with patient Carron Curie CMA  July 13, 2010 1:37 PM Daytime phone number verified with patient.    Physical Exam  General:  obese.   Nose:  clear nasal discharge.   Mouth:  MP 3, no oral lesions Neck:  no JVD.   Lungs:  diminished breath sounds, no rales or wheeze, no dullness Heart:  Regular rate and rhythm; no murmurs, rubs,  or bruits. Extremities:  no clubbing, cyanosis, edema, or deformity noted Neurologic:  normal CN II-XII and strength normal.   Cervical Nodes:  No lymphadenopathy noted Psych:  Alert and cooperative. Normal mood and affect.   Impression & Recommendations:  Problem # 1:  COUGH (ICD-786.2) From tobacco abuse, chronic bronchitis, and post-nasal drip.  Improved since last visit.  Will continue her current  inhaler regimen.  Problem # 2:  OBSTRUCTIVE SLEEP APNEA (ICD-327.23) Will get report from CPAP machine and decide if she needs adjustment in her pressure or whether she may need adjustment in her mask.  Problem # 3:  TOBACCO USE (ICD-305.1) Again explained the need to stop smoking.  Problem # 4:  ALLERGIC RHINITIS (ICD-477.9) She is to continue her sinus regimen and nasal irrigation.  Medications Added to Medication List This Visit: 1)  Flonase 50 Mcg/act Susp (Fluticasone propionate) .... One spray once daily 2)  Carvedilol 12.5 Mg Tabs (Carvedilol) .... Take 1 tablet by mouth two times a day  Complete  Medication List: 1)  Spiriva Handihaler 18 Mcg Caps (Tiotropium bromide monohydrate) .... One puff once daily 2)  Singulair 10 Mg Tabs (Montelukast sodium) .... Take one by mouth daily 3)  Albuterol Sulfate (2.5 Mg/74ml) 0.083% Nebu (Albuterol sulfate) .... Use one vial in neb every 4 hours as needed 4)  Ventolin Hfa 108 (90 Base) Mcg/act Aers (Albuterol sulfate) .... 2 puffs up to every 4 hours as needed wheeze 5)  Flonase 50 Mcg/act Susp (Fluticasone propionate) .... One spray once daily 6)  Clarinex 5 Mg Tabs (Desloratadine) .[name].. 1 by mouth once daily 7)  Hydrochlorothiazide 50 Mg Tabs (Hydrochlorothiazide) .... Take one by mouth daily 8)  Potassium Chloride Cr 10 Meq Cpcr (Potassium chloride) .... Take one by mouth two times a day 9)  Crestor 10 Mg Tabs (Rosuvastatin calcium) .... Take one by mouth daily 10)  Janumet 50-1000 Mg Tabs (Sitagliptin-metformin hcl) .... Take 1 tablet by mouth two times a day 11)  Plavix 75 Mg Tabs (Clopidogrel bisulfate) .... Take one by mouth daily 12)  Imdur 60 Mg Xr24h-tab (Isosorbide mononitrate) .... Take by mouth two times a day 13)  Aspirin 325 Mg Tabs (Aspirin) .... Take one by mouth daily with food 14)  Amaryl 2 Mg Tabs (Glimepiride) .... Take 1 tablet by mouth two times a day 15)  Mucinex 600 Mg Tb12 (Guaifenesin) .... 2 by mouth two times a  day 16)  Omeprazole 20 Mg Cpdr (Omeprazole) .[name].. 1 twice a day 30 minutes before meals. 17)  Carvedilol 12.5 Mg Tabs (Carvedilol) .... Take 1 tablet by mouth two times a day 18)  Nitrolingual 0.4 Mg/spray Tl Soln (Nitroglycerin) .... Use as directed 19)  Flexeril 10 Mg Tabs (Cyclobenzaprine hcl) .... One by mouth at bedtime as needed 20)  Neurontin 600 Mg Tabs (Gabapentin) .... As directed 21)  Ranexa 500 Mg Xr12h-tab (Ranolazine) .... Take 1 tablet by mouth two times a day 22)  Micardis 40 Mg Tabs (Telmisartan) .... Take 1 tablet by mouth once a day 23)  Wellbutrin Xl 150 Mg Xr24h-tab (Bupropion hcl) .[name].. 1 by mouth once daily in am 24)  Fish Oil 1000 Mg Caps (Omega-3 fatty acids) .... Take 2 capsules  by mouth once a day 25)  Buspirone Hcl 15 Mg Tabs (Buspirone hcl) .... One tablet by mouth twice a day  Other Orders: Est. Patient Level III (47829) DME Referral (DME)  Patient Instructions: 1)  Will get report from CPAP machine 2)  Follow up in 6 months

## 2010-10-31 NOTE — Progress Notes (Signed)
Summary: 90 day diovan refill  Phone Note Refill Request Message from:  Fax from Pharmacy on June 14, 2008 12:15 PM  Refills Requested: Medication #1:  DIOVAN 160 MG  TABS take one by mouth daily   Supply Requested: 3 months  Method Requested: Fax to [facility] Initial call taken by: Liane Comber,  June 14, 2008 12:15 PM  Follow-up for Phone Call        needs f/u with me in mid nov please px written on EMR for call in  Follow-up by: Judith Part MD,  June 14, 2008 1:19 PM  Additional Follow-up for Phone Call Additional follow up Details #1::        Rx faxed to pharmacy Additional Follow-up by: Liane Comber,  June 14, 2008 2:04 PM    New/Updated Medications: DIOVAN 160 MG  TABS (VALSARTAN) take one by mouth daily   Prescriptions: DIOVAN 160 MG  TABS (VALSARTAN) take one by mouth daily  #90 x 0   Entered and Authorized by:   Judith Part MD   Signed by:   Liane Comber on 06/14/2008   Method used:   Telephoned to ...         RxID:   [id number]

## 2010-10-31 NOTE — Progress Notes (Signed)
  Phone Note Call from Patient   Caller: Patient Summary of Call: Patient called back to let you know that she went thru [facility] and had a eval done by a Pschologist Dr Mikey Bussing who made  her an appt with a Psychiatrist for Friday June 17th with a Dr Georjean Mode. Initial call taken by: Carlton Adam,  March 10, 2010 2:55 PM  Follow-up for Phone Call        thanks for the update Follow-up by: Judith Part MD,  March 10, 2010 2:55 PM

## 2010-10-31 NOTE — Miscellaneous (Signed)
Summary: Pulmonary function test   Pulmonary Function Test Date: 11/02/2009 Height (in.): 59 Gender: Female  Pre-Spirometry FVC    Value: 2.19 L/min   Pred: 2.61 L/min     % Pred: 84 % FEV1    Value: 1.80 L     Pred: 1.94 L     % Pred: 93 % FEV1/FVC  Value: 82 %     Pred: 75 %     % Pred: . % FEF 25-75  Value: 2.19 L/min   Pred: 2.44 L/min     % Pred: 90 %  Post-Spirometry FVC    Value: 2.24 L/min   Pred: 2.61 L/min     % Pred: 86 % FEV1    Value: 1.85 L     Pred: 1.94 L     % Pred: 95 % FEV1/FVC  Value: 82 %     Pred: 75 %     % Pred: . % FEF 25-75  Value: 2.13 L/min   Pred: 2.44 L/min     % Pred: 87 %  Lung Volumes TLC    Value: 3.98 L   % Pred: 101 % RV    Value: 1.61 L   % Pred: 116 % DLCO    Value: 12.2 %   % Pred: 59 % DLCO/VA  Value: 3.37 %   % Pred: 88 %  Comments: Normal spirometry, no bronchodilator response, normal lung volumes, moderate diffusion defect.  Clinical Lists Changes  Observations: Added new observation of PFT COMMENTS: Normal spirometry, no bronchodilator response, normal lung volumes, moderate diffusion defect. (11/02/2009 7:55) Added new observation of DLCO/VA%EXP: 88 % (11/02/2009 7:55) Added new observation of DLCO/VA: 3.37 % (11/02/2009 7:55) Added new observation of DLCO % EXPEC: 59 % (11/02/2009 7:55) Added new observation of DLCO: 12.2 % (11/02/2009 7:55) Added new observation of RV % EXPECT: 116 % (11/02/2009 7:55) Added new observation of RV: 1.61 L (11/02/2009 7:55) Added new observation of TLC % EXPECT: 101 % (11/02/2009 7:55) Added new observation of TLC: 3.98 L (11/02/2009 7:55) Added new observation of FEF2575%EXPS: 87 % (11/02/2009 7:55) Added new observation of PSTFEF25/75P: 2.44  (11/02/2009 7:55) Added new observation of PSTFEF25/75%: 2.13 L/min (11/02/2009 7:55) Added new observation of PSTFEV1/FCV%: . % (11/02/2009 7:55) Added new observation of FEV1FVCPRDPS: 75 % (11/02/2009 7:55) Added new observation of PSTFEV1/FVC: 82 %  (11/02/2009 7:55) Added new observation of POSTFEV1%PRD: 95 % (11/02/2009 7:55) Added new observation of FEV1PRDPST: 1.94 L (11/02/2009 7:55) Added new observation of POST FEV1: 1.85 L/min (11/02/2009 7:55) Added new observation of POST FVC%EXP: 86 % (11/02/2009 7:55) Added new observation of FVCPRDPST: 2.61 L/min (11/02/2009 7:55) Added new observation of POST FVC: 2.24 L (11/02/2009 7:55) Added new observation of FEF % EXPEC: 90 % (11/02/2009 7:55) Added new observation of FEF25-75%PRE: 2.44 L/min (11/02/2009 7:55) Added new observation of FEF 25-75%: 2.19 L/min (11/02/2009 7:55) Added new observation of FEV1/FVC%EXP: . % (11/02/2009 7:55) Added new observation of FEV1/FVC PRE: 75 % (11/02/2009 7:55) Added new observation of FEV1/FVC: 82 % (11/02/2009 7:55) Added new observation of FEV1 % EXP: 93 % (11/02/2009 7:55) Added new observation of FEV1 PREDICT: 1.94 L (11/02/2009 7:55) Added new observation of FEV1: 1.80 L (11/02/2009 7:55) Added new observation of FVC % EXPECT: 84 % (11/02/2009 7:55) Added new observation of FVC PREDICT: 2.61 L (11/02/2009 7:55) Added new observation of FVC: 2.19 L (11/02/2009 7:55) Added new observation of PFT HEIGHT: 59  (11/02/2009 7:55) Added new observation of PFT DATE: 11/02/2009  (11/02/2009  7:55) 

## 2010-10-31 NOTE — Assessment & Plan Note (Signed)
Summary: REFILL OMEPRAZOLE/FH   History of Present Illness Visit Type: follow up Primary GI MD: Elie Goody MD [facility] Primary Provider: [name], MD Requesting Provider: n/a Chief Complaint: GERD. Need Omeprazole refilled. History of Present Illness:   This is a 55 year old female who returns for followup of GERD. She was omeprazole for a short time and had a significant flare of reflux symptoms. Since returning to omeprazole 20 mg twice daily. Her symptoms have resolved. She previously underwent endoscopy in May 2008 which showed erosive esophagitis and gastritis.   GI Review of Systems      Denies abdominal pain, acid reflux, belching, bloating, chest pain, dysphagia with liquids, dysphagia with solids, heartburn, loss of appetite, nausea, vomiting, vomiting blood, weight loss, and  weight gain.        Denies anal fissure, black tarry stools, change in bowel habit, constipation, diarrhea, diverticulosis, fecal incontinence, heme positive stool, hemorrhoids, irritable bowel syndrome, jaundice, light color stool, liver problems, rectal bleeding, and  rectal pain.   Current Medications (verified): 1)  Hydrochlorothiazide 50 Mg  Tabs (Hydrochlorothiazide) .... Take One By Mouth Daily 2)  Potassium Chloride Cr 10 Meq  Cpcr (Potassium Chloride) .... Take One By Mouth Bid 3)  Singulair 10 Mg  Tabs (Montelukast Sodium) .... Take One By Mouth Daily 4)  Crestor 10 Mg  Tabs (Rosuvastatin Calcium) .... Take One By Mouth Daily 5)  Janumet 50-1000 Mg Tabs (Sitagliptin-Metformin Hcl) .... Take 1 Tablet By Mouth Two Times A Day 6)  Plavix 75 Mg  Tabs (Clopidogrel Bisulfate) .... Take One By Mouth Daily 7)  Imdur 60 Mg  Xr24h-Tab (Isosorbide Mononitrate) .... Take By Mouth Two Times A Day 8)  Aspirin 325 Mg  Tabs (Aspirin) .... Take One By Mouth Daily 9)  Amaryl 4 Mg  Tabs (Glimepiride) .... One By Mouth Once Daily 10)  Mucinex 600 Mg  Tb12 (Guaifenesin) .... 2 By Mouth Two Times A Day 11)   Omeprazole 20 Mg  Cpdr (Omeprazole) .[name].. 1 Twice A Day 30 Minutes Before Meals. Must Keep Office Visit For Further Refills! 12)  Carvedilol 3.125 Mg  Tabs (Carvedilol) .... One By Mouth Two Times A Day 13)  Nitrolingual 0.4 Mg/spray Tl Soln (Nitroglycerin) .... Use As Directed 14)  Propoxyphene N-Apap 100-650 Mg  Tabs (Propoxyphene N-Apap) .... One By Mouth Two Times A Day As Needed 15)  Flexeril 10 Mg  Tabs (Cyclobenzaprine Hcl) .... One By Mouth At Bedtime 16)  Ventolin Hfa 108 (90 Base) Mcg/act  Aers (Albuterol Sulfate) .... 2 Puffs Up To Every 4 Hours As Needed Wheeze 17)  Hydrocodone-Homatropine 5-1.5 Mg/32ml Syrp (Hydrocodone-Homatropine) .[name].. 1-2 Teaspoons At Bedtime As Needed For Severe Cough 18)  Albuterol Sulfate (2.5 Mg/81ml) 0.083% Nebu (Albuterol Sulfate) .... Use One Vial in [place] Every 4 Hours As Needed 19)  Neurontin 600 Mg Tabs (Gabapentin) .... As Directed 20)  Clarinex 5 Mg Tabs (Desloratadine) .[name].. 1 By Mouth Once Daily As Needed Allergy Symptoms 21)  Ranexa 500 Mg Xr12h-Tab (Ranolazine) .... Take 1 Tablet By Mouth Two Times A Day 22)  Micardis 40 Mg Tabs (Telmisartan) .... Take 1 Tablet By Mouth Once A Day  Allergies (verified): 1)  Tetracycline 2)  Ace Inhibitors 3)  * Eggs  Past History:  Past Medical History: Allergic rhinitis Anxiety Tobacco abuse  COPD / emphysema Coronary artery disease- CABG CVA  Depression Diabetes mellitus, type II GERD / erosive esophagitis Hyperlipidemia Hypertension Obstuctive Sleep Apnea Hyperplastic colon polyps Internal hemorrhoids  opthy- Hyacinth Meeker  ortho- [place]  endo- [facility]  psych  Past Surgical History: Appendectomy Cholecystectomy Tonsillectomy Endometrial biopsy- neg (DUB)   Cervical fusion x 3 Left foot surgry- plantar fasciitis Exp. laparoscopy- intestinal obstruction Left arm fracture (07/2004)  Carotid doppler- left stenosis Dexa- normal (11/2002) Abd US- echodensity (01/2003)   worse (12/2006) Cardiolite- pos  (07/2006) Cath- stent LCA, OM (08/2006) EGD - erosive esophagitis (01/2007) colonoscopy (6/09)- hyperplastic polyp and hemorrhoids Pelvic US- left ovarian cyst CT- head- old infarction (11/1997)  Family History: Father: MI's, HTN, skin cancer Mother: MI's, ? CVA's Siblings: 1 sister with HTN No FH of Colon Cancer:  Social History: Reviewed history from 11/22/2008 and no changes required. Marital Status: Married Children: none Occupation: disability Current Smoker  Review of Systems       The pertinent positives and negatives are noted as above and in the HPI. All other ROS were reviewed and were negative.   Vital Signs:  Patient profile:   55 year old female Height:      57 inches Weight:      171 pounds BMI:     37.14 BSA:     1.68 Pulse rate:   92 / minute Pulse rhythm:   regular BP sitting:   128 / 80  (left arm) Cuff size:   regular  Vitals Entered By: Ok Anis CMA (June 14, 2009 2:05 PM)  Physical Exam  General:  Well developed, well nourished, no acute distress. obese.   Head:  Normocephalic and atraumatic. Eyes:  PERRLA, no icterus. Mouth:  No deformity or lesions, dentition normal. Lungs:  Clear throughout to auscultation. Heart:  Regular rate and rhythm; no murmurs, rubs,  or bruits. Psych:  Alert and cooperative. Normal mood and affect.   Impression & Recommendations:  Problem # 1:  GERD (ICD-530.81) Refill omeprazole 20 mg twice daily. Advised to take 30 minutes before breakfast and dinner. Continue antireflux measures. Could obtain refills from primary physician since her disease is stable and I will see as needed.  Problem # 2:  SPECIAL SCREENING FOR MALIGNANT NEOPLASMS COLON (ICD-V76.51) Colorectal cancer screening due June 2019.  Patient Instructions: 1)  Rx. for Omeprazole 20 mg 1 by mouth two times a day #180 x 3 RFS printed and faxed to [facility]. 2)  The medication list was reviewed and reconciled.  All changed / newly  prescribed medications were explained.  A complete medication list was provided to the patient / caregiver. 3)  Copy sent to : [name], MD  Prescriptions: OMEPRAZOLE 20 MG  CPDR (OMEPRAZOLE) 1 twice a day 30 minutes before meals.  #180 x 3   Entered by:   Milford Cage NCMA   Authorized by:   Meryl Dare MD [facility]   Signed by:   Milford Cage NCMA on 06/14/2009   Method used:   Printed then faxed to ...       [facility], account # [id number] (mail-order)             , [place]         Ph:        Fax: [phone]   RxID:   [id number]

## 2010-11-02 ENCOUNTER — Other Ambulatory Visit: Payer: Self-pay | Admitting: Neurology

## 2010-11-02 ENCOUNTER — Ambulatory Visit
Admission: RE | Admit: 2010-11-02 | Discharge: 2010-11-02 | Disposition: A | Discharge status: Home / Self Care | Payer: Medicare Other | Source: Ambulatory Visit | Attending: Neurology | Admitting: Neurology

## 2010-11-02 DIAGNOSIS — I6990 Unspecified sequelae of unspecified cerebrovascular disease: Secondary | ICD-10-CM

## 2010-11-02 MED ORDER — IOHEXOL 350 MG/ML SOLN: 100.0000 mL | Freq: Once | INTRAVENOUS | Status: AC | PRN

## 2010-11-02 NOTE — Letter (Signed)
Summary: Anticoagulation Modification Letter  [facility]  [address] [place], [place] [address]   Phone: [phone]  Fax: [phone]    September 14, 2010  Re:    Diane [name] DOB:    [birth year] MRN:    [id number]    Dear Dr. Swaziland,  We have scheduled the above patient for an endoscopic procedure. Our records show that  he/she is on anticoagulation therapy. Please advise as to how long the patient may come off their therapy of Plavix prior to the scheduled procedure(s) on 10/20/10.   Please fax back/or route the completed form to [name] at [phone].  Thank you for your help with this matter.  Sincerely,  Christie Nottingham CMA [facility])   Physician Recommendation:  Hold Plavix 5 days prior ________________  Hold Coumadin 5 days prior ____________  Other ______________________________     Appended Document: Anticoagulation Modification Letter Pt notified to come off Plavix 5 days before her procedure. Pt verbalized understanding.

## 2010-11-02 NOTE — Letter (Signed)
Summary: Diabetic Instructions  [facility]  [address] [place], [place] [address]   Phone: [phone]  Fax: [phone]    Diane [name] October 24, 1955 MRN: [id number]   _x  _   ORAL DIABETIC MEDICATION INSTRUCTIONS  The day before your procedure:   Take your diabetic pill as you do normally  The day of your procedure:   Do not take your diabetic pill    We will check your blood sugar levels during the admission process and again in Recovery before discharging you home

## 2010-11-02 NOTE — Progress Notes (Signed)
Summary: refill request for clarinex  Phone Note Refill Request Message from:  Fax from Pharmacy  Refills Requested: Medication #1:  CLARINEX 5 MG TABS 1 by mouth once daily Faxed request from [facility] order pharmacy is on your shelf.    Initial call taken by: Lowella Petties CMA, AAMA,  October 16, 2010 11:12 AM  Follow-up for Phone Call        form done and in nurse in box  Follow-up by: Judith Part MD,  October 16, 2010 1:23 PM  Additional Follow-up for Phone Call Additional follow up Details #1::        [facility]ed [facility].  Additional Follow-up by: Melody Comas,  October 16, 2010 2:16 PM    New/Updated Medications: CLARINEX 5 MG TABS (DESLORATADINE) 1 by mouth once daily Prescriptions: CLARINEX 5 MG TABS (DESLORATADINE) 1 by mouth once daily  #90 x 3   Entered and Authorized by:   Judith Part MD   Signed by:   Melody Comas on 10/16/2010   Method used:   Historical   RxID:   [id number]

## 2010-11-02 NOTE — Letter (Signed)
Summary: Anticoag  Anticoag   Imported By: Lester [place] 09/19/2010 10:49:56  _____________________________________________________________________  External Attachment:    Type:   Image     Comment:   External Document

## 2010-11-02 NOTE — Assessment & Plan Note (Signed)
Summary: GERD, dysphagia/sheri   History of Present Illness Visit Type: Follow-up Visit Primary GI MD: Elie Goody MD [facility] Primary Provider: [name], MD Requesting Provider: n/a Chief Complaint: GERD, dysphagia with solids and liquids and food gets stuck when she swallows  History of Present Illness:   Mrs. Millay relates intermittent problems with solid food dysphasia and frequent breakthrough reflux symptoms. She also has several episodes daily of coughing and choking when she is swallowing liquids and solids for the past 6 months.    GI Review of Systems    Reports acid reflux, dysphagia with liquids, dysphagia with solids, and  heartburn.      Denies abdominal pain, belching, bloating, chest pain, loss of appetite, nausea, vomiting, vomiting blood, weight loss, and  weight gain.        Denies anal fissure, black tarry stools, change in bowel habit, constipation, diarrhea, diverticulosis, fecal incontinence, heme positive stool, hemorrhoids, irritable bowel syndrome, jaundice, light color stool, liver problems, rectal bleeding, and  rectal pain.   Current Medications (verified): 1)  Spiriva Handihaler 18 Mcg Caps (Tiotropium Bromide Monohydrate) .... One Puff Once Daily 2)  Singulair 10 Mg  Tabs (Montelukast Sodium) .... Take One By Mouth Daily 3)  Albuterol Sulfate (2.5 Mg/71ml) 0.083% Nebu (Albuterol Sulfate) .... Use One Vial in [place] Every 4 Hours As Needed 4)  Ventolin Hfa 108 (90 Base) Mcg/act  Aers (Albuterol Sulfate) .... 2 Puffs Up To Every 4 Hours As Needed Wheeze 5)  Flonase 50 Mcg/act Susp (Fluticasone Propionate) .... Two Sprays  Once Daily 6)  Clarinex 5 Mg Tabs (Desloratadine) .[name].. 1 By Mouth Once Daily 7)  Hydrochlorothiazide 50 Mg  Tabs (Hydrochlorothiazide) .... Take One By Mouth Daily 8)  Potassium Chloride Cr 10 Meq  Cpcr (Potassium Chloride) .... Take One By Mouth Two Times A Day 9)  Crestor 10 Mg  Tabs (Rosuvastatin Calcium) .... Take One By Mouth  Daily 10)  Janumet 50-1000 Mg Tabs (Sitagliptin-Metformin Hcl) .... Take 1 Tablet By Mouth Two Times A Day 11)  Plavix 75 Mg  Tabs (Clopidogrel Bisulfate) .... Take One By Mouth Daily 12)  Imdur 60 Mg  Xr24h-Tab (Isosorbide Mononitrate) .... Take By Mouth Two Times A Day 13)  Aspirin 325 Mg  Tabs (Aspirin) .... Take One By Mouth Daily With Food 14)  Amaryl 2 Mg Tabs (Glimepiride) .... 2 Every Am and 1 Every Pm 15)  Mucinex 600 Mg  Tb12 (Guaifenesin) .... 2 By Mouth Two Times A Day 16)  Omeprazole 20 Mg  Cpdr (Omeprazole) .[name].. 1 Twice A Day 30 Minutes Before Meals. 17)  Carvedilol 12.5 Mg Tabs (Carvedilol) .... Take 1 Tablet By Mouth Two Times A Day 18)  Nitrolingual 0.4 Mg/spray Tl Soln (Nitroglycerin) .... Use As Directed 19)  Flexeril 10 Mg  Tabs (Cyclobenzaprine Hcl) .... One By Mouth At Bedtime As Needed 20)  Neurontin 600 Mg Tabs (Gabapentin) .[name].. 1 Every Am, 1 Every Afternoon 1 At Bedtime 21)  Ranexa 500 Mg Xr12h-Tab (Ranolazine) .... Take 1 Tablet By Mouth Two Times A Day 22)  Micardis 40 Mg Tabs (Telmisartan) .... Take 1 Tablet By Mouth Once A Day 23)  Fish Oil 1000 Mg Caps (Omega-3 Fatty Acids) .... Take 2 Capsules  By Mouth Once A Day 24)  Buspirone Hcl 15 Mg Tabs (Buspirone Hcl) .... One Tablet By Mouth Twice A Day 25)  Tramadol Hcl 50 Mg Tabs (Tramadol Hcl) .... One Two Times A Day 26)  Debrox 6.5 % Soln (Carbamide  Peroxide) .... Use As Directed On Bottle Twice Weekly To Help Ear Wax 27)  Caltrate 600+d Plus 600-400 Mg-Unit Tabs (Calcium Carbonate-Vit D-Min) .[name].. 1 By Mouth Two Times A Day 28)  Cymbalta 30 Mg Cpep (Duloxetine Hcl) .... Two Tablets By Mouth in The Morning 29)  Benadryl 25 Mg Caps (Diphenhydramine Hcl) .... As Needed  Allergies (verified): 1)  Tetracycline 2)  Ace Inhibitors 3)  * Eggs  Past History:  Past Medical History: Allergic rhinitis Anxiety Tobacco abuse       - PFT 11/02/09 FEV1 1.85(95%), FEV1% 82, TLC 3.98 (101), DLCO 59%, no BD Coronary artery  disease- CABG Hx of CVA  Depression Diabetes mellitus, type II GERD / erosive esophagitis Hyperlipidemia Hypertension Obstuctive Sleep Apnea      - PSG 04/10/06 RDI 105      - CPAP 9 cm H2O Hyperplastic colon polyps Internal hemorrhoids Goiter with thyroid nodules- watched by Dr Talmage Nap  Gastritis  Chronic Headaches   opthy- [facility]- [place]  endo- [facility]  pulm- [facility] - Dr Pennie Rushing  Past Surgical History: Appendectomy Cholecystectomy Tonsillectomy Endometrial biopsy- neg (DUB)   Cervical fusion x 3 Left foot surgry- plantar fasciitis Exp. laparoscopy- intestinal obstruction Left arm fracture (07/2004)  Carotid doppler- left stenosis Dexa- normal (11/2002) Abd US- echodensity (01/2003)   worse (12/2006) Cardiolite- pos (07/2006) Cath- stent LCA, OM,(08/2006) Bypass Surgery(11/2005) EGD - erosive esophagitis (01/2007) colonoscopy (6/09)- hyperplastic polyp and hemorrhoids Pelvic US- left ovarian cyst CT- head- old infarction (11/1997) 10/11 carotid doppler 60-79    bilat - yearly f/u recommended   Family History: Reviewed history from 11/15/2009 and no changes required. Father: MI's, HTN, skin cancer, died of ca - lung/liver/adrenal -- was a smoker  Mother: MI's, ? CVA's Siblings: 1 sister with HTN No FH of Colon Cancer:  Social History: Disability  Marital Status: Married Children: none Current Smoker Alcohol Use - yes: occ  Daily Caffeine Use: occ  Illicit Drug Use - no Drug Use:  no  Review of Systems       The patient complains of allergy/sinus, cough, and headaches-new.         The pertinent positives and negatives are noted as above and in the HPI. All other ROS were reviewed and were negative.   Vital Signs:  Patient profile:   55 year old female Height:      57 inches Weight:      166 pounds BMI:     36.05 BSA:     1.66 Pulse rate:   88 / minute Pulse rhythm:   regular BP sitting:   110 / 64  (left arm) Cuff size:    regular  Vitals Entered By: Ok Anis CMA (September 14, 2010 9:57 AM)  Physical Exam  General:  Well developed, well nourished, no acute distress. Head:  Normocephalic and atraumatic. Eyes:  PERRLA, no icterus. Mouth:  No deformity or lesions, dentition normal. Lungs:  Clear throughout to auscultation. Heart:  Regular rate and rhythm; no murmurs, rubs,  or bruits. Abdomen:  Soft, nontender and nondistended. No masses, hepatosplenomegaly or hernias noted. Normal bowel sounds. Psych:  Alert and cooperative. Normal mood and affect.  Impression & Recommendations:  Problem # 1:  OTHER DYSPHAGIA (ICD-787.29) There are two components of her dysphagia: one is likely esophageal and one is likely oropharyngeal. Proceed with a modified swallowing study. The risks, benefits and alternatives to endoscopy with possible biopsy and possible dilation were discussed with the patient and they consent  to proceed. The procedure will be scheduled electively. Orders: EGD SAV (EGD SAV) Barium Swallow, Modified  (Modified BS)  Problem # 2:  GERD (ICD-530.81) Intensify all antireflux measures. Change to Dexilant 60 mg q.a.m. and discontinue omeprazole.  Problem # 3:  CORONARY ARTERY DISEASE (ICD-414.00) Plan for 5 days hold the Plavix. She may remain on a daily aspirin. Obtain clearance from her cardiologist.  Patient Instructions: 1)  Upper Endoscopy with Dilatation brochure given.  2)  You have been scheduled for a Modified Barium Swallow on 09/20/10 at 11:00am at [facility] 1st floor admitting. 3)  Stop taking omeprazole and start Dexilant one tablet by mouth once daily. Samples given to patient to start. 4)  Copy sent to : [name], MD 5)                          Peter Swaz[place], MD 6)  The medication list was reviewed and reconciled.  All changed / newly prescribed medications were explained.  A complete medication list was provided to the patient / caregiver.  Prescriptions: DEXILANT 60  MG CPDR (DEXLANSOPRAZOLE) one tablet by mouth once daily  #30 x 11   Entered by:   Christie Nottingham CMA (AAMA)   Authorized by:   Meryl Dare MD [facility]   Signed by:   Christie Nottingham CMA (AAMA) on 09/14/2010   Method used:   Electronically to        [facility] [address] [phone]* (retail)       [address]       [place], [place]  [address]       Ph: [phone] or [phone]       Fax: [phone]   RxID:   [id number]

## 2010-11-02 NOTE — Procedures (Addendum)
Summary: Upper Endoscopy w/DIL  Patient: Diane [name] Note: All result statuses are Final unless otherwise noted.  Tests: (1) Upper Endoscopy w/DIL (UED)  UED Upper Endoscopy w/DIL                             DONE     [facility]     [address]     [place], [place]  [address]           ENDOSCOPY PROCEDURE REPORT           PATIENT:  Diane, [name]  MR#:  [id number]     BIRTHDATE:  1956/05/03, 54 yrs. old  GENDER:  female     ENDOSCOPIST:  Judie Petit T. Russella Dar, MD, [facility]           PROCEDURE DATE:  10/20/2010     PROCEDURE:  EGD with dilatation over guidewire     ASA CLASS:  Class III     INDICATIONS:  1) dysphagia  2) GERD     MEDICATIONS:  Fentanyl 75 mcg IV, Versed 8 mg IV     TOPICAL ANESTHETIC:  Exactacain Spray     DESCRIPTION OF PROCEDURE:   After the risks benefits and     alternatives of the procedure were thoroughly explained, informed     consent was obtained.  The [id number] endoscope was     introduced through the mouth and advanced to the second portion of     the duodenum, without limitations.  The instrument was slowly     withdrawn as the mucosa was carefully examined.     <<PROCEDUREIMAGES>>     The esophagus and gastroesophageal junction were completely normal     in appearance.  The stomach was entered and closely examined. The     pylorus, antrum, angularis, and lesser curvature were well     visualized, including a retroflexed view of the cardia and fundus.     The stomach wall was normally distensable. The scope passed easily     through the pylorus into the duodenum. The duodenal bulb was     normal in appearance, as was the postbulbar duodenum. Dilation was     then performed at the total esophagus for dysphagia without a     stricture:           1) Dilator:  Savary over guidewire  Size:  17 mm     Resistance:  none  Heme:  none           COMPLICATIONS:  None           ENDOSCOPIC IMPRESSION:     1) Normal EGD        RECOMMENDATIONS:     1) anti-reflux regimen long term     2) continue PPI long term     3) post dilation instructions           Horace Wishon T. Russella Dar, MD, [facility]           CC:  Judy Pimple, MD           n.     Rosalie DoctorJudie Petit T. Khushboo Chuck at 10/20/2010 04:01 PM           Chachere, Lupita Leash, [id number]  Note: An exclamation mark (!) indicates a result that was not dispersed into the flowsheet. Document Creation Date: 10/20/2010 4:01 PM _______________________________________________________________________  (1) Order result status: Final Collection or  observation date-time: 10/20/2010 15:58 Requested date-time:  Receipt date-time:  Reported date-time:  Referring Physician:   Ordering Physician: Claudette Head [phone]) Specimen Source:  Source: Launa Grill Order Number: [id number] Lab site:

## 2010-11-02 NOTE — Letter (Signed)
Summary: EGD Instructions  [facility]  [address] [place], [place] [address]   Phone: [phone]  Fax: [phone]       Diane [name]    19-Jul-1956    MRN: [id number]       Procedure Day /Date:January 20th, 2012     Arrival Time:  2:00pm     Procedure Time: 3:00pm     Location of Procedure:                    _x  _ [facility] (4th Floor)    PREPARATION FOR ENDOSCOPY   On 10/20/10 THE DAY OF THE PROCEDURE:  1.   No solid foods, milk or milk products are allowed after midnight the night before your procedure.  2.   Do not drink anything colored red or purple.  Avoid juices with pulp.  No orange juice.  3.  You may drink clear liquids until 1:00pm, which is 2 hours before your procedure.                                                                                                CLEAR LIQUIDS INCLUDE: Water Jello Ice Popsicles Tea (sugar ok, no milk/cream) Powdered fruit flavored drinks Coffee (sugar ok, no milk/cream) Gatorade Juice: apple, white grape, white cranberry  Lemonade Clear bullion, consomm, broth Carbonated beverages (any kind) Strained chicken noodle soup Hard Candy   MEDICATION INSTRUCTIONS  Unless otherwise instructed, you should take regular prescription medications with a small sip of water as early as possible the morning of your procedure.  Diabetic patients - see separate instructions.  Stop taking Plavix or Aggrenox on  _  (5 days before procedure).      Additional medication instructions: You will be contacted by our office prior to your procedure for directions on holding your Plavix.  If you do not hear from our office 1 week prior to your scheduled procedure, please call [phone] to discuss.          OTHER INSTRUCTIONS  You will need a responsible adult at least 57 years of age to accompany you and drive you home.   This person must remain in the waiting room during your procedure.  Wear  loose fitting clothing that is easily removed.  Leave jewelry and other valuables at home.  However, you may wish to bring a book to read or an iPod/MP3 player to listen to music as you wait for your procedure to start.  Remove all body piercing jewelry and leave at home.  Total time from sign-in until discharge is approximately 2-3 hours.  You should go home directly after your procedure and rest.  You can resume normal activities the day after your procedure.  The day of your procedure you should not:   Drive   Make legal decisions   Operate machinery   Drink alcohol   Return to work  You will receive specific instructions about eating, activities and medications before you leave.    The above instructions have been reviewed and explained to me by  _______________________    I fully understand and can verbalize these instructions _____________________________ Date _________

## 2010-11-02 NOTE — Procedures (Signed)
Summary: Modified Barium Swallow   Modified Barium Swallow  Procedure date:  09/20/2010  Findings:      abnormal:   MODIFIED BARIUM SWALLOW STUDY SWALLOW HISTORY  21Dec11 11:15am BD  SWALLOW HX    MBSS Outpatient                          Yes        21Dec11 11:54am BD    Therapy Diagnosis Impairment             Dysphagia  21Dec11 11:54am BD    Dx/Referral Reason/Past Medical Hx       Yes        21Dec11 11:54am BD           Comment: Pt is a 55 yo female arriving for an outpatient MBS           secondary to complaints of coughing with foods and liquids, a           feeling that things are going down the wrong way, food           getting stuck and coming back up. PMH: right parietal CVA           secondary to carotid dissection, cervical surgery x3 (no           immediate difficulty swallowing following CVA or cervical           surgery), DN, HTN, dyspipidemia, tobacco abuse, emphysema.    Verbal consent obtained for MBSS         Patient    21Dec11 11:54am BD    Pt FollowsDirections/CompleteStrategies  Yes        21Dec11 11:54am BD    Dentition condition                      Adequate   21Dec11 11:54am BD    Postural control adequate for testing    Yes        21Dec11 11:54am BD    Current Diet                             Regular    21Dec11 11:54am BD    Liquids                                  Thin       21Dec11 11:54am BD    Oral Motor Evaluation                    WFL        21Dec11 11:54am BD    Penetration/Aspiration Scale Guide       Yes        21Dec11 11:54am BD           Comment: 1 = material does not enter airway           2 = material enters airway, remains ABOVE vocal cords           then ejected out           3 = material enters airway, remains ABOVE cords and not           ejected out           4 = material enters airway, CONTACTS cords then ejected  out           5 = material enters airway, CONTACTS cords and not ejected           out           6 = material  enters airway, passes BELOW cords then ejected           out           7 = material enters airway, passes BELOW cords and not           ejected out despite cough attempt by patient           8 = material enters airway, passes BELOW cords without           attempt by patient to eject out(silent aspiration)  SWALLOW PHASE THIN/TSP  21Dec11 11:15am BD  ORAL THIN TEASPOON    Not tested-TT                            Yes        21Dec11 11:54am BD  ORAL PHARYNGEAL THIN TEASPOON  PHARYNGEAL THIN TEASPOON  SWALLOW PHASE THIN/CUP  21Dec11 11:15am BD  ORAL THIN CUP  ORAL PHARYNGEAL THIN CUP    Swallow initiation location-TC           PyriformSi 21Dec11 11:54am BD  PHARYNGEAL THIN CUP    Laryngeal Penetration-TC                 Silent     21Dec11 11:54am BD    Penetration/Aspiration Scale-TC          4          21Dec11 11:54am BD  SWALLOW PHASE THIN/STRAW  21Dec11 11:15am BD  ORAL THIN STRAW  ORAL PHARYNGEAL THIN STRAW    Swallow initiation location-TS           PyriformSi 21Dec11 11:54am BD  PHARYNGEAL THIN STRAW    Laryngeal Penetration-TS                 Silent     21Dec11 11:55am BD    Penetration/Aspiration Scale-TS          4          21Dec11 11:54am BD  SWALLOW PHASE NECTAR/TSP  21Dec11 11:15am BD  ORAL NECTAR TEASPOON    Not tested-NT                            Yes        21Dec11 11:54am BD  ORAL PHARYNGEAL NECTAR TEASPOON  PHARYNGEAL NECTAR TEASPOON  SWALLOW PHASE NECTAR/CUP  21Dec11 11:15am BD  ORAL NECTAR CUP    Not tested-[place]                            Yes        21Dec11 11:54am BD  ORAL PHARNYGEAL NECTAR CUP  PHARYNGEAL NECTAR CUP  SWALLOW PHASE NECTAR/STRAW  21Dec11 11:15am BD  ORAL NECTAR STRAW    Not tested-NS                            Yes        21Dec11 11:54am BD  ORAL PHARYNGEAL NECTAR STRAW  PHARYNGEAL NECTAR STRAW  SWALLOW PHASE HONEY/TSP  21Dec11 11:15am BD  ORAL HONEY TEASPOON  Not tested-HT                            Yes        21Dec11 11:55am  BD  ORAL PHARYNGEAL HONEY TEASPOON  PHARYNGEAL HONEY TEASPOON  SWALLOW PHASE HONEY/CUP  21Dec11 11:15am BD  ORAL HONEY CUP    Not tested-HC                            Yes        21Dec11 11:55am BD  ORAL PHARYNGEAL HONEY CUP  PHARYNGEAL HONEY CUP  SWALLOW PHASE PUREE  21Dec11 11:15am BD  ORAL PUREE  ORAL PHARYNGEAL PUREE    Swallow initiation location-P            PyriformSi 21Dec11 11:55am BD  PHARYNGEAL PUREE    Laryngeal Penetration-P                  Silent     21Dec11 11:55am BD    Penetration/Aspiration Scale-P           2,4        21Dec11 11:55am BD  SWALLOW PHASE MECHANICAL SOFT  21Dec11 11:15am BD  ORAL MECHANICAL SOFT  ORAL PHARYNGEAL MECHANICAL SOFT    Swallow initiation location-MS           PyriformSi 21Dec11 11:55am BD  PHARYNGEAL MECHANICAL SOFT    Laryngeal Penetration-MS                 Silent     21Dec11 11:55am BD    Penetration/Aspiration Scale-MS          2,4        21Dec11 11:55am BD  SWALLOW PHASE MIXED  21Dec11 11:15am BD  ORAL MIXED CONSISTENCY    Not tested-MC                            Yes        21Dec11 11:55am BD  ORAL PHARYNGEAL MIXED CONSISTENCY  PHARYNGEAL MIXED CONSISTENCY  SWALLOW PHASE PILL  21Dec11 11:15am BD  ORAL PILL  ORAL PHARYNGEAL PILL    Swallow initiation location-PL           PyriformSi 21Dec11 11:56am BD  PHARYNGEAL PILL    Laryngeal Penetration-PL                 Silent     21Dec11 11:56am BD    Penetration/Aspiration Scale-PL          4          21Dec11 11:56am BD  SWALLOW EVALUATION  21Dec11 11:15am BD  SWALLOW EVAL    Normal UES Relaxation                    Yes        21Dec11 12:00pm BD    Esophageal Bolus Transit                 Yes        21Dec11 12:00pm BD           Comment: Esophageal transit appeared mildy slow, but           functional.  No radiologist present to confirm.  MBSS RECOMMENDATIONS    Clinical Impressions/Functional Problems Yes        21Dec11 13:56pm BD  Comment: Ms. Fullenwider presents  with a mild pharyngeal dysphagia           with decreased sensation leading to a significant delay in           swallow initiation with silent pentration to the cords.           Liquids and solids delay to the pyriform sinuses and           penetrate the laryngeal vestibule, though vocal folds remain           fully adducted preventing any aspiration.  When swallow is           initiated all material is squeezed out of the airway and           transits to the esophagus without stasis.  With solid           consistencies the pt has increased delay due to difficulty           initiating the swallow response.  The pt reports episodes of           coughing; likely pt is intermittently aspirating with           sensation.  The pt has not had recent pna and is either           appropriately removing aspirates with cough or tolerating           aspirates without illness.  Discussed risk and possible           decline with any onset of new illness.  Pt at mild risk, but           may continue regulsr thin diet at this time.    Diet recommendations                     Regular    21Dec11 13:56pm BD    Liquid recommendations                   Thin       21Dec11 13:56pm BD    Swallowing therapy recommended           No         21Dec11 13:56pm BD  MBSS ASPIRATION PRECAUTIONS    Supervision needed                       None       21Dec11 13:56pm BD    Eat/drink only when alert                Yes        21Dec11 13:56pm BD    Seated/upright, 90 degrees               Yes        21Dec11 13:56pm BD    No Straws                                Yes        21Dec11 13:56pm BD    Small bites/sips                         Yes        21Dec11 13:56pm BD  TREATMENT PLAN AND GOALS  21Dec11 11:15am BD  MBSS TREATMENT PLAN/GOALS    Inpatient Treatment Plan  N/A             Yes        21Dec11 13:56pm BD  MBSS MUTUAL GOALS    Mutual Goals N/A                         Yes        21Dec11 13:56pm BD  MBSS  INTERVENTIONS  MBSS EDUCATION AND DISCHARGE  21Dec11 11:15am BD  MBSS PATIENT/FAMILY EDUCATION    Recommend patient receive f/u therapy at N/A        21Dec11 13:57pm BD    Compensatory strategies provided         Verbal     21Dec11 13:57pm BD    Diet modification provided               Verbal     21Dec11 13:57pm BD    Aspiration precautions provided          Verbal     21Dec11 13:57pm BD    Reflux precautions provided              Verbal     21Dec11 13:57pm BD    Verbalizes understanding of education    Patient    21Dec11 13:57pm BD    MBSS Goals/discharge criteria discussed* Yes        21Dec11 13:57pm BD  MBSS TIME    MBSS Therapy Time In                     1120       21Dec11 13:57pm BD    MBSS Therapy Time Out                    1140       21Dec11 13:57pm BD    MBSS Therapy Charges/minutes             MBS        21Dec11 13:57pm BD  MBSS REPORTS    MBSS Copy to Referring Physician         Yes        21Dec11 13:57pm BD  MBSS REVIEW OF STUDENT COMPLETION  Appended Document: Modified Barium Swallow mild abnormalities, see MBSS recommendations  Appended Document: Modified Barium Swallow Pt notified of results of study.

## 2010-11-02 NOTE — Progress Notes (Signed)
Summary: refill spiriva  Phone Note From Pharmacy   Caller: larissa w/ [facility] Call For: Leaner Morici  Summary of Call: caller says they never received a response to the refill request for spiriva. fax # [phone]. contact # is [phone]. NOTE: this was approved on 12/ 7 with 6 fills per emr msg.  Initial call taken by: Tivis Ringer, CNA,  September 20, 2010 5:07 PM  Follow-up for Phone Call        rx has been sent for pts spiriva Randell Loop [facility]  September 20, 2010 5:28 PM     Prescriptions: SPIRIVA HANDIHALER 18 MCG CAPS (TIOTROPIUM BROMIDE MONOHYDRATE) one puff once daily  #1 Capsule x 5   Entered by:   Randell Loop CMA   Authorized by:   Coralyn Helling MD   Signed by:   Randell Loop CMA on 09/20/2010   Method used:   Electronically to        [facility]* (mail-order)       [address], [address], [place]  [address]       Ph: [phone]       Fax: [phone]   RxID:   [id number]

## 2010-11-16 NOTE — Letter (Signed)
Summary: [facility]  [facility]   Imported By: Kassie Mends 11/03/2010 10:44:30  _____________________________________________________________________  External Attachment:    Type:   Image     Comment:   External Document

## 2010-12-04 ENCOUNTER — Other Ambulatory Visit: Payer: Self-pay

## 2010-12-08 ENCOUNTER — Other Ambulatory Visit: Payer: Self-pay

## 2011-01-12 ENCOUNTER — Other Ambulatory Visit: Payer: Self-pay

## 2011-01-12 ENCOUNTER — Ambulatory Visit: Payer: Self-pay

## 2011-02-13 NOTE — Assessment & Plan Note (Signed)
[facility]                         GASTROENTEROLOGY OFFICE NOTE   Diane [name], Diane [name]                       MRN:          [id number]  DATE:03/12/2007                            DOB:          31-Dec-1955    Diane [name] notes some mild reflux symptoms but states she is doing much  better since beginning Omeprazole on a daily basis. She does not some  morning nausea and some intermittent post prandial symptoms. Her MRCP  showed only very mild diffuse biliary dilatation which is most likely  related to her prior cholecystectomy. She has no colorectal complaints  and specifically denies any change in bowel habits, change in stool  caliber, melena or hematochezia. There is no family history of colon  cancer.   CURRENT MEDICATIONS:  Listed on the chart, updated and reviewed.   MEDICATION ALLERGIES:  TETRACYCLINE   PHYSICAL EXAMINATION:  GENERAL:  No acute distress.  VITAL SIGNS:  Weight 167 pounds.  CHEST:  Clear to auscultation bilaterally.  CARDIAC:  Regular rate and rhythm without murmurs.  ABDOMEN:  Soft, nontender with normal active bowel sounds.  BACK:  There is mild tenderness along the right lateral mid back and the  right costal margin that does appear to have improved since her last  exam in April.   ASSESSMENT/PLAN:  1. Gastroesophageal reflux disease with erosive esophagitis and      gastritis. She is given all written instructions on standard      antireflux measures. Increase omeprazole to 20 mg p.o. b.i.d. taken      30 minutes before breakfast and dinner.  2. Fatty infiltration of liver. Long-term weight loss program with      optimal management of diabetes. Ongoing followup with Dr. Milinda Antis.  3. Colorectal cancer screening. The risks, benefits, and alternatives      to colonoscopy with possible biopsy and possible polypectomy were      discussed with the patient and she consents to proceed. This will      be scheduled electively. Plavix  will be discontinued for 7 days      prior to the procedure and she will remain on aspirin.     Venita Lick. Russella Dar, MD, [facility]  Electronically Signed    MTS/MedQ  DD: 03/12/2007  DT: 03/12/2007  Job #: [id number]   cc:   Marne A. Milinda Antis, MD

## 2011-02-13 NOTE — Procedures (Signed)
CAROTID DUPLEX EXAM   INDICATION:  Followup of known carotid artery disease.  Last exam on  10/18/2005 showed 40%-59% right ICA stenosis with velocities of 121/39  cm/sec and 60%-79% left ICA stenosis with velocities of 190/58 cm/sec.   HISTORY:  Diabetes:  Yes, oral dependent.  Cardiac:  CABG x5 in 2007 followed by PTCA x2 in late 2007.  Hypertension:  Yes.  Smoking:  Yes, 1-1/2 packs per day.  Previous Surgery:  CV History:  Posterior CVA in 1993 with loss of coordination and  balance, no lateralizing symptoms.  Amaurosis Fugax Yes No, Paresthesias Yes No, Hemiparesis Yes No                                       RIGHT             LEFT  Brachial systolic pressure:  Brachial Doppler waveforms:  Vertebral direction of flow:        Antegrade         Antegrade  DUPLEX VELOCITIES (cm/sec)  CCA peak systolic                   109               85  ECA peak systolic                   160               265  ICA peak systolic                   197               239  ICA end diastolic                   73                101  PLAQUE MORPHOLOGY:                  Calcified with shadowing            Calcified with shadowing  PLAQUE AMOUNT:                      Moderate          Moderate - large  PLAQUE LOCATION:                    Origin ICA        Origin ICA   IMPRESSION:  1. 60%-79% right internal carotid artery stenosis, increased since      exam of 10/18/2005.  2. 60%-79% left internal carotid artery stenosis, slightly increased      since exam of 10/18/2005.   ___________________________________________  Larina Earthly, M.D.   RS/MEDQ  D:  07/21/2010  T:  07/21/2010  Job:  [id number]

## 2011-02-16 NOTE — Assessment & Plan Note (Signed)
[facility]                             PULMONARY OFFICE NOTE   Diane [name], Diane [name]                       MRN:          [id number]  DATE:12/12/2006                            DOB:          1956-09-23    This is a very pleasant, 55 year old, white female, who is followed here  for severe obstructive sleep apnea with an RDI of 105 events per hour.  The patient has been titrated to CPAP at 15 cm of water pressure.  She  is actually doing very well with her CPAP, occasionally wakes up and  finds that she has removed it, but she puts it back on.  She states that  she sleeps much better and wakes up refreshed.  She actually has lost  approximately eight pounds of weight and is trying to continue on steady  weight loss.  She continues to smoke little cigars, but apparently is to  start CHANTIX per her primary care physician, Dr. Roxy [name].   CURRENT MEDICATIONS:  Are as noted on the intake sheet.  These have been  reviewed and are accurate.   PHYSICAL EXAMINATION:  VITAL SIGNS:  Noted oxygen saturation is 98% on  room air.  GENERAL:  This is a well-developed, somewhat obese female, who is in no  acute distress.  HEENT:  Unremarkable.  NECK:  Supple, no adenopathy noted, no JVD.  LUNGS:  Clear to auscultation bilaterally.  CARDIAC:  Regular rate and rhythm, no rubs, murmurs, or gallops heard.  EXTREMITIES:  The patient has no cyanosis, no clubbing, no edema noted.   IMPRESSION:  Obstructive sleep apnea.  The patient is very well-  compensated on her current continuous positive airway pressure of 15 cm  of water pressure.   PLAN:  1. The patient to continue CPAP as is.  2. Followup will be in four to six months time and at that time I will      assign her to see Dr. Craige [name] for her sleep issues as I will be      leaving the practice.  The patient was advised of this.     [name], MD  Electronically Signed    CLG/MedQ  DD: 12/12/2006   DT: 12/13/2006  Job #: [id number]

## 2011-02-16 NOTE — H&P (Signed)
Diane [name], Diane [name]                ACCOUNT NO.:  [id number]   MEDICAL RECORD NO.:  [id number]          PATIENT TYPE:  INP   LOCATION:  2303                         FACILITY:  [facility]   PHYSICIAN:  Elmore Guise., M.D.DATE OF BIRTH:  1956/08/04   DATE OF ADMISSION:  12/03/2005  DATE OF DISCHARGE:                                HISTORY & PHYSICAL   INDICATION FOR ADMISSION:  Continued chest pain.   HISTORY OF PRESENT ILLNESS:  Patient is a pleasant 55 year old white female  who was recently admitted to the hospital on March1, 2007 with acute  coronary syndrome and EKG changes.  She underwent cardiac catheterization  showing multivessel coronary disease and was getting ready for surgery early  this week.  Patient left on Sunday, March3 against medical advice.  She  states she was very anxious and wanted to go smoke.  After getting home  and smoking some cigarettes she started having recurrent chest pain.  She  then presented back to the ER for further evaluation.  Please see H&P dated  March1,2007 for complete evaluation.   REVIEW OF SYSTEMS:  As per HPI, are otherwise negative.   CURRENT MEDICATIONS:  Glipizide, Glucophage, Actos, Crestor, Diovan, and  Procardia.   ALLERGIES:  TETRACYCLINE.   FAMILY HISTORY:  Positive for coronary disease and diabetes.   SOCIAL HISTORY:  She is single and smokes two packs per day for the last 30  years and drinks occasional alcohol.   PHYSICAL EXAMINATION:  VITAL SIGNS:  She was afebrile.  Blood pressure is  130/90, heart rate is in the 80 range in normal sinus rhythm, respirations  are 18.  She is saturating 95% on room air.  GENERAL:  She is a very pleasant, middle-aged white female white female, alert  and oriented x4, in no acute distress.  NECK:  She has no JVD and no bruits.  LUNGS:  Clear.  HEART:  Regular with a normal S1-S2.  ABDOMEN:  Obese, soft, nontender, nondistended.  EXTREMITIES:  Warm with trace pedal pulses.  Her right  femoral area is well  healed from her cardiac catheterization.  No hematoma.   Her ECG was reviewed and showed T-wave inversions in the inferior leads as  well as Q-waves consistent with old inferior MI.  Her BUN and creatinine are  8 and 0.6.  Electrolytes within normal limits.  Her ALT was mildly elevated  at 103 which was new from her discharge.  Her initial cardiac markers were  negative with a CPK-MB of 1.6, troponin 0.11, and myoglobin of 87.  Chest x-  ray showed no acute cardiopulmonary disease.   IMPRESSION:  1.  Unstable angina.  2.  Recent acute coronary syndrome with severe obstructive multivessel      disease, left AMA on March4.  3.  Diabetes mellitus.  4.  Hypertension.  5.  Dyslipidemia.  6.  Ongoing tobacco dependence.   PLAN:  Patient will be admitted to a telemetry bed.  She will have serial  cardiac enzymes performed.  She will also have nitroglycerin drip as well as  heparin.  I did notify Dr. Andrey Spearman that patient has made it back  to the hospital so that she may have her bypass surgery as previously  scheduled.  We will start her on Lopressor 25 mg twice daily, continue her  Statin, aspirin.      Elmore Guise., M.D.  Electronically Signed     TWK/MEDQ  D:  12/04/2005  T:  12/04/2005  Job:  [id number]

## 2011-02-16 NOTE — Discharge Summary (Signed)
Diane [name], Diane [name]                ACCOUNT NO.:  [id number]   MEDICAL RECORD NO.:  [id number]          PATIENT TYPE:  INP   LOCATION:  2028                         FACILITY:  [facility]   PHYSICIAN:  Salvatore Decent. Dorris Fetch, M.D.DATE OF BIRTH:  January 28, 1956   DATE OF ADMISSION:  12/03/2005  DATE OF DISCHARGE:  12/11/2005                                 DISCHARGE SUMMARY   ADMISSION DIAGNOSIS:  Three vessel coronary artery disease with unstable  post infarct angina.   DISCHARGE/SECONDARY DIAGNOSES:  1.  Three vessel coronary artery disease with unstable post infarct angina      status post coronary artery bypass grafting.  2.  Postoperative fluid volume excess, improving with diuresis.  3.  Tobacco dependence, voicing a desire to quit.  4.  Diabetes mellitus type 2 with hemoglobin A1C on November 27, 2005,      elevated at 7.5.  5.  Dyslipidemia.  6.  Hypertension.  7.  History of previous cerebrovascular accident secondary to carotid      dissection.  8.  Emphysema.  9.  Allergy to TETRACYCLINE.   PROCEDURE:  December 04, 2005 - Median sternotomy for coronary artery bypass  grafting x5 using left internal mammary artery to the LAD, left radial  artery to the ramus intermedius, saphenous vein graft to the obtuse marginal  1, sequential saphenous vein graft to the acute marginal and posterior  descending, endoscopic vein harvesting from the left thigh with open vein  harvest from the right leg.   SURGEON:  Charlett Lango, M.D.   HISTORY OF PRESENT ILLNESS:  Diane [name] is a 55 year old Caucasian female  who presented to [facility] on November 29, 2005 with unstable post  infarct angina. She has had an out of hospital myocardial infarction. At the  time of catheterization, she had a totally occluded right coronary with  thrombus but essentially had completed myocardial infarction. The patient  was advised to undergo coronary artery bypass grafting by Dr. Charlett Lango and she  was scheduled for surgery on December 04, 2005. However, on  December 02, 2005, the patient signed out against medical advice due to anxiety  and desire to smoke but returned the following morning with recurrent  unstable angina. She was initially re-admitted by Dr. Reyes Ivan and started on  nitroglycerin drip and heparin. Plans were made to resume scheduling of her  bypass surgery on December 04, 2005.   HOSPITAL COURSE:  On December 04, 2005, Diane [name] underwent coronary artery  bypass grafting surgery as previously discussed. Postoperatively, she was  transferred to the Surgical Intensive Care Unit. She remained there through  postoperative day one. By that time, her chest tube was at its base and  monitoring lines were discontinued. She did require diuresis with fluid  volume excess with weight of approximately 15 pounds from her baseline. She  also required supplemental oxygen at 4 liters per nasal cannula, which was  not unexpected with her history of tobacco abuse. Once on unit 2000, Ms.  [name] made steady progress. Initially, she did have mild leukocytosis with  white blood cell  count near 17,000. However, this was monitored over several  days and it gradually decreased to around 11,000. She remained afebrile.  Chest x-ray showed no evidence of pneumonia although initially with some  mild interstitial edema and bibasilar atelectasis, which improved during her  hospitalization. She was also ultimately weaned from supplemental oxygen and  was saturating above 90% and had no significant shortness of breath.   Issues addressed during her hospitalization did include hyperglycemia. She  was restarted on her home diabetes medication regimen but did require Lantus  up to 40 units subcutaneously. Ultimately, her sugars decreased and were  primarily ranging less than 150 on 20 units of Lantus. It was ultimately  decided to increase her Actos and not send her home on Lantus. She is  instructed to follow her  blood sugars closely and to notify her primary care  physician, Dr. Lucretia Roers, if consistently elevated.   Another issue includes postoperative fluid volume excess. She did require  b.i.d. dosing of Lasix therapy initially but by discharge, weight was now  only about 8 pounds above baseline. It was felt that she required continued  diuretic therapy, at least until her followup with cardiology.  Postoperatively, they also monitored her incisions closely. Her radial and  sternal incisions remained clean, dry, and intact. However, her right leg  incision did have a fair amount of serosanguinous drainage but no purulent  drainage. There was mild erythema of the right groin incision and of the  left knee incision. These incisions were changed at least daily with dry  gauze dressing and she was started on Ciprofloxacin empirically. Home health  was also arranged with [facility] to continue wound monitoring on  discharge. They were also asked to remove her staples on December 18, 2005 as  well. On postoperative day 7, Diane [name] was felt ready for discharge home.  She has been maintaining normal sinus rhythm. Vital signs were stable as  well with blood pressure at discharge of 130/78. Heart rate was in the 80's  and 90's and sinus rhythm. She is afebrile. Oxygen saturation was 92% on  room air. She is voiding without difficulty and bowels were also functioning  perfectly. She was tolerating her carbohydrate modified diet and pain was  controlled on oral medication. She was ambulating in the hallway  independently with steady gait.   PHYSICAL EXAMINATION:  HEART:  Regular rate and rhythm.  LUNGS:  Were somewhat coarse at the bases but otherwise clear.  ABDOMEN:  Examination was benign.  EXTREMITIES:  Continued to have 1 to 2+ edema with leg incision as  previously discussed.   LABORATORY DATA:  Most recent labs were stable showing a platelet count of 129,000. White blood cell count of 11.4.  Hemoglobin 11.5, hematocrit 34.3.  Sodium 136, potassium 4.3, chloride 103, CO2 27, BUN 11, creatinine 0.8.  Calcium 8.9. Capillary blood glucose ranges between 134 and 148 over the  last 24 hours. Liver function studies on December 03, 2005 showed total  bilirubin of 0.6, alkaline phosphatase 81, AST and ALT elevated at 57 and  103 respectively. Total protein was 6.0. Blood albumin 3.3.   DISPOSITION:  On December 11, 2005, Diane [name] was felt appropriate for  discharge home and was discharged in stable and improved condition. Smoking  cessation was reviewed during this hospitalization and she agreed to stay  quit.   DISCHARGE MEDICATIONS:  1.  Lasix 40 mg p.o. daily x2 weeks.  2.  K-Dur 20 mEq daily  x2 weeks.  3.  Ciprofloxacin 500 mg p.o. b.i.d. x7 more days.  4.  Coated aspirin 325 mg daily.  5.  Lopressor 25 mg p.o. b.i.d.  6.  Crestor 10 mg p.o. q. p.m.  7.  Cymbalta 30 mg p.o. q.h.s.  8.  Wellbutrin XL 300 mg p.o. daily.  9.  Actos 30 mg p.o. daily.  10. Singulair 10 mg p.o. daily.  11. Glipizide ER 10 mg daily.  12. Clarinex 5 mg p.o. daily.  13. Metformin 1,000 mg p.o. daily.  14. Advair Diskus 100/50 mcg 1 puff b.i.d. or 1 dose inhaled b.i.d.  15. Mucinex 600 mg p.o. b.i.d. as needed.  16. Lorazepam 1 mg p.o. b.i.d. as needed.  17. Albuterol inhaler 2 puffs q.4 hours p.r.n.  18. __________2 sprays per nostril daily as needed.  19. Tylox 1 to 2 tablets p.o. q.4 to 6 hours p.r.n. for pain.   ACTIVITY:  She is instructed to avoid driving or heavy lifting of more than  10 pounds. She is encouraged to continue to walk and breathing exercises.   DIET:  Low-fat, low-salt, carbohydrate modified diet. She is to continue to  monitor her blood sugars regularly.   WOUND CARE:  She may shower and clean her incisions gently with soap and  water. She is to notify the [facility] office if she develops fever greater than  101, or redness and drainage from her incision site, or increasing  shortness  of breath. She is to apply dry gauze dressing to her leg incisions daily  until the drainage subsides and she is to notify the [facility] office if she  develops purulent drainage or opening of her incision sites. Home health  nurse [facility] will assist in wound care management. She is to have her  staples removed on December 18, 2005 by the home health nurse.   SPECIAL INSTRUCTIONS:   FOLLOW UP:  1.  She is to call [phone] to schedule a 2 week followup with Dr. Lady Deutscher. She is to have a chest x-ray as well at this appointment.  2.  She is to see Dr. Dorris Fetch at the [facility] office on January 17, 2006 at      1:15 p.m. and she will notify the [facility]      office sooner if needed.  3.  She is to call and reschedule her pulmonary followup, which had been      arranged prior to her hospitalization.  4.  She should also call and schedule diabetes followup with her primary      care physician, Dr. [name].     Jerold Coombe, P.A.    ______________________________  Salvatore Decent Dorris Fetch, M.D.    AWZ/MEDQ  D:  12/11/2005  T:  12/12/2005  Job:  [id number]   cc:   Elmore Guise., M.D.  Fax: [phone]   Marne A. Tower, M.D. [facility]  [address] [place]  [place] [address]   Salvatore Decent. Dorris Fetch, M.D.  [address]  [place]  [place] [address]

## 2011-02-16 NOTE — Consult Note (Signed)
NAMEROSALENE, WARDROP                ACCOUNT NO.:  [id number]   MEDICAL RECORD NO.:  [id number]          PATIENT TYPE:  INP   LOCATION:  1827                         FACILITY:  [facility]   PHYSICIAN:  Elmore Guise., M.D.DATE OF BIRTH:  November 03, 1955   DATE OF CONSULTATION:  11/29/2005  DATE OF DISCHARGE:                                   CONSULTATION   REASON FOR REFERRAL:  ST elevation inferior myocardial infarction.   HISTORY OF PRESENT ILLNESS:  The patient is a very pleasant 55 year old  white female with the past medical history of hypertension, COPD, tobacco  dependence, diabetes mellitus, dyslipidemia and history of a stroke who  presents with 12 hour history of off and on substernal chest pain, shortness  of breath and nausea. The patient stated her pain started last evening. At  its worse, it was 10 out of 10 and this lasted approximately 2 to 3 hours.  This improved spontaneously. She then went to bed. She awoke approximately 3  hours later with continued chest pain. She then tried some positional  changes with some improvement in her pain. She then went back to sleep. Upon  awakening, she continued to have her pain anywhere from 2 to 5 out of 10,  worse with exertion. She called her primary care physician and discussed her  symptoms then was told to come urgently to the emergency department for  further evaluation. The patient reports mild nausea and dizziness associated  with her symptoms. She denies any symptoms prior to today. She does have  mild dyspnea on exertion. On arrival to the ER, she was given aspirin,  nitroglycerin drip with some improvement in her symptoms. Her blood pressure  was 160/100 and her heart rate was 100. Her oxygen saturation was 100 and  she was hemodynamically stable. Her ECG did show mild inferior ST elevation  with reciprocal lateral changes. The patient then was brought to the cardiac  cath lab for urgent cardiac catheterization with possible  intervention.   Review of systems is positive for mild lower extremity edema. All others are  negative.   Her current medications are Glipizide, Glucophage, Actos and Crestor, also a  blood pressure medicine unknown.   ALLERGY TO TETRACYCLINE.   Family history is positive for coronary disease and diabetes.   Social history she is single, smokes at least 2 packs per day for the last  30 years, occasional alcohol use.   VITAL SIGNS: On exam she is afebrile, blood pressure 150/100, heart rate is  90, respirations are 18, she is satting at 95% on room air.  GENERAL: In general she is a very pleasant middle-aged white female who  appears older than stated age, in mild distress.  NECK: She has no JVD.  LUNGS: Decreased breath sounds with prolonged exertional component.  HEART: Regular with a normal S1/S2.  ABDOMEN: Obese, soft, nontender, nondistended.  EXTREMITIES: Warm with trace pulses.  NEUROLOGICAL: The cranial nerves are intact. Strength is equal bilaterally.   Her ECG was reviewed and showed mild inferior ST elevation consistent with  inferior MI. Her lab  work is pending at the time of this dictation.   IMPRESSION:  1.  Inferior ST elevation myocardial infarction.  2.  Multiple co-morbidities including (tobacco dependence, diabetes      mellitus, dyslipidemia, hypertension, strong family history).   PLAN:  Urgent catheterization with possible intervention. Continue  anticoagulation. Will hold Plavix at this time. Aggressive risk factor  modification as indicated.      Elmore Guise., M.D.  Electronically Signed     TWK/MEDQ  D:  11/29/2005  T:  11/29/2005  Job:  [id number]

## 2011-02-16 NOTE — Discharge Summary (Signed)
Diane [name], Diane [name]                ACCOUNT NO.:  [id number]   MEDICAL RECORD NO.:  [id number]          PATIENT TYPE:  INP   LOCATION:  6527                         FACILITY:  [facility]   PHYSICIAN:  Elmore Guise., M.D.DATE OF BIRTH:  02-04-1956   DATE OF ADMISSION:  08/06/2006  DATE OF DISCHARGE:  08/09/2006                                 DISCHARGE SUMMARY   DISCHARGE DIAGNOSES:  1. History of coronary artery disease status post coronary artery bypass      grafting.  2. Cardiac catheterization on November 6 showing occluded vein graft to      OM, vein graft to RCA, and radial graft to ramus intermedius with      patent LIMA to LAD.  3. Successful PCI of circumflex/OM with drug eluting stent on August 08, 2006.  4. Diabetes mellitus.  5. Dyslipidemia.  6. Tobacco dependence.  7. Hypertension.   HISTORY OF PRESENT ILLNESS:  The patient is a very pleasant 55 year old  white female with multiple medical problems who underwent coronary artery  bypass grafting approximately six months ago. She has been having increasing  exertional chest pain with rehab and underwent a stress Cardiolite which  showed a moderate size reversible lateral wall defect. She was then brought  for cardiac catheterization. Her diagnostic catheterization was performed on  August 06, 2006. This showed three occluded grafts with a patent LIMA to  LAD. She had diffuse native vessel disease. Because of her contrast load, we  had to set her up for sequential evaluation with a PCI within the next 24-48  hours. The patient underwent successful PCI of her circumflex/OM vessel on  August 08, 2006. She has now been up and ambulatory. She is having no  further chest pain. She will be discharged home today to continue the  following medications.   MEDICATIONS:  1. Aspirin once daily.  2. Lopressor 25 mg twice daily.  3. Crestor 10 mg daily.  4. Cymbalta 60 mg daily.  5. Wellbutrin 300 mg daily.  6.  Singulair 10 mg daily.  7. Glipizide 10 mg daily.  8. Clarinex 5 mg daily.  9. Metformin 1 g b.i.d. (to start on August 11, 2006).  10.Advair 1 puff twice daily.  11.Mucinex p.r.n.  12.Lorazepam 1 mg p.o. q.8 h. p.r.n.  13.Flexeril 10 mg p.o. q.8 h. p.r.n.  14.Lyrica 2 tablets three times daily.  15.Diovan once daily.  16.HCTZ 50 mg daily.  17.Plavix 75 mg daily.  18.Imdur 30 mg daily.  19.Nitroglycerin p.r.n.  20.Chantix. I did discuss complete tobacco cessation with her, and she      understands the importance of this.   She will have routine post catheterization restrictions, and understands  that she should not start back her metformin until Sunday, November11.  She will follow up with Dr. Reyes Ivan at [facility] in two weeks.  She is to notify the office should she have any questions or concerns.      Elmore Guise., M.D.  Electronically Signed     TWK/MEDQ  D:  08/09/2006  T:  08/09/2006  Job:  [id number]

## 2011-02-16 NOTE — Assessment & Plan Note (Signed)
[facility]                         GASTROENTEROLOGY OFFICE NOTE   CASHE, GATT                       MRN:          [id number]  DATE:01/29/2007                            DOB:          09/18/1956    REASON FOR REFERRAL:  Right flank pain and gastroesophageal reflux  disease.   HISTORY OF PRESENT ILLNESS:  Mrs. Ruppert is a 55 year old white female  who I have previously evaluated for abnormal liver function tests, felt  secondary to fatty infiltration of the liver.  Please see my prior  evaluation from March 25, 2003.  She relates a 2-3 month history of pain  that began in her mid lateral right back, and now has radiated around  the right costal margin and anteriorly as well.  The symptoms do not  appear to be related to any digestive function.  They are not changed  with positioning or movement.  A recent abdominal ultrasound was  performed, that showed increased hepatic echodensity and a mildly  dilated common bile duct (measuring 10.8 mm).  Prior cholecystectomy was  also noted.  She relates significant problems with worsening heartburn,  indigestion, reflux and regurgitation.  She was previously treated with  Nexium, which did help her symptoms, but she is no longer on an acid  suppressant.  She has problems with gas bloating and loose stools, but  it has been a chronic issue and these symptoms have not changed.  She  notes no rectal bleeding or change in stool caliber.  There is no family  history of colon cancer, colon polyps or inflammatory bowel disease.   PAST MEDICAL HISTORY:  1. Obstructive sleep apnea, on CPAP.  2. Status post coronary artery bypass grafting 2007.  3. Status post cholecystectomy 1984.  4. Status post back surgery 1990s.  5. Status post sinus surgery 1991.  6. Chronic obstructive pulmonary disease with asthmatic bronchitis.  7. Diabetes mellitus.  8. Hypertension.  9. Hyperlipidemia.  10.Status post  cerebrovascular accident  2.  11.Anxiety/panic disorder, depression.  12.Chronic headaches.  13.Status post appendectomy.  14.Status post laparotomy for a blocked fallopian tube and ovarian      cysts.  15.Fibromyalgia   CURRENT MEDICATIONS:  Listed in the chart updated and reviewed.   MEDICATION ALLERGIES:  TETRACYCLINE.   SOCIAL HISTORY:  Per the handwritten form.   REVIEW OF SYSTEMS:  Per the handwritten form.   PHYSICAL EXAMINATION:  GENERAL:  Obese white female in no acute  distress.  VITAL SIGNS:  Height 4 feet 10 inches, weight 169 pounds, blood pressure  118/74, pulse 80 and regular.  HEENT:  Sclerae are anicteric.  Oropharynx clear.  CHEST:  Clear to auscultation bilaterally.  CARDIAC:  Regular rate and rhythm without murmurs.  BACK:  Reveals mild tenderness in the right lateral mid back, along the  costal margin.  ABDOMEN:  Soft with minimal right upper quadrant tenderness to deep  palpation.  No rebound or guarding.  No palpable organomegaly, masses or  hernias.  Normal active bowel sounds.[name]  EXTREMITIES:  Without cyanosis, clubbing or edema.  NEUROLOGIC:  Alert and oriented  x3.  Grossly nonfocal.   ASSESSMENT AND PLAN:  1. Right back, right flank and right upper quadrant pain.  Her      symptoms appear to be musculoskeletal, and may be related to      fibromyalgia or another musculoskeletal disorder.  I think it is      less likely that the symptoms are gastrointestinal in origin;      however, ulcer disease, common bile duct stones and other GI causes      need to be further excluded.  Recent liver function tests were      normal.  Will schedule and abdominal MRI with MRCP.  Begin      omeprazole 20 mg p.o. q.a.m., along with standard anti-reflux      measures.  Schedule upper endoscopy.  Obtain stool Hemocults.  2. Consider colonoscopy in the near future, after her current      complaints are further evaluated.  3. Fatty liver. Long term weight loss and  management of Diabetes and      Hyperlidemia per Dr. Milinda Antis.     Venita Lick. Russella Dar, MD, [facility]  Electronically Signed    MTS/MedQ  DD: 01/29/2007  DT: 01/29/2007  Job #: [id number]   cc:   Marne A. Milinda Antis, MD

## 2011-02-16 NOTE — Consult Note (Signed)
NAMEMARVINA, [name]                ACCOUNT NO.:  [id number]   MEDICAL RECORD NO.:  [id number]          PATIENT TYPE:  INP   LOCATION:  2911                         FACILITY:  [facility]   PHYSICIAN:  Genene Churn. Love, M.D.    DATE OF BIRTH:  24-Feb-1956   DATE OF CONSULTATION:  11/29/2005  DATE OF DISCHARGE:                                   CONSULTATION   This 55 year old right-handed white divorced female was admitted after three  days of chest pain, bilateral arm pain and bilateral arm numbness, and  underwent a cardiac catheterization showing evidence of three-vessel  coronary artery disease.  It was felt that she is a candidate for surgery  and is now being evaluated neurologically for a past history of neurologic  disease.   HISTORY OF PRESENT ILLNESS:  Diane [name] has a known history of hypertension.  In January 1993, she developed symptoms of headache, significant elevated  blood pressure, and near-syncope with falling spells.  She was found to have  evidence of a dissection of her right internal carotid artery extending in a  long segment with stenosis in the 75-80% range.  The artery tapered  approximately 2 cm above the skull base with obstruction.  She had evidence  of a right parietal stroke at that time.  Subsequently there was thought to  be some stenosis of her left internal carotid artery as well in the 50-60%  range, but this was not found on angiogram.  Repeat Doppler studies five  months later showed the distal right internal carotid artery and the left  internal carotid artery flow to be normal, vertebral flow was antegrade, and  it was felt that the dissection had resolved.  She has had difficulty with  cervical spondylosis and has had a fusion of her cervical spine, has had  migraine headaches, chronic obstructive pulmonary disease with continued  cigarette use, hypertension, lower back pain, fibromyalgia, and  gastroesophageal reflux disease.  She has had snoring  symptoms and is felt  possibly to have sleep apnea, though she has not been evaluated for it at  this time.   PHYSICAL EXAMINATION:  GENERAL:  A well-developed, pleasant white female.  VITAL SIGNS:  Blood pressure in the right and left arm of 120/80, heart rate  64.  There were no bruits.  NEUROLOGIC:  Mental status:  She is alert and oriented x3.  Cranial nerve  examination revealed discs flat.  Spontaneous venous pulsations seen.  Mild  bilateral ptosis.  Corneals present.  Facial sensation equal.  Tongue  midline.  Uvula midline.  Gag is present.  Sternocleidomastoid and trapezius  testing normal.  Motor examination with 5/5 strength proximally and distally  in the upper and lower extremities with the right leg not fully tested.  Sensory examination intact to pinprick, touch, joint position and vibration.  The deep tendon reflexes were 1-2+, and plantar responses were downgoing.   IMPRESSION:  1.  Old right parietal stroke, code 434.01, secondary to right internal      carotid artery dissection, probably secondary to hypertension.  2.  History of  right internal carotid artery dissection, code 433.10.  3.  Hypertension, code 796.2.  4.  Chronic obstructive pulmonary disease, code 496.  5.  Possible history of sleep apnea, code 780.57.  6.  Coronary artery disease, code 429.2.   PLAN:  Agree with surgery.  Would recommend preop Doppler study of the  carotids.           ______________________________  Genene Churn. Sandria Manly, M.D.     JML/MEDQ  D:  11/29/2005  T:  11/30/2005  Job:  [id number]

## 2011-02-16 NOTE — Cardiovascular Report (Signed)
[name], Diane [name]                ACCOUNT NO.:  [id number]   MEDICAL RECORD NO.:  [id number]          PATIENT TYPE:  INP   LOCATION:  1827                         FACILITY:  [facility]   PHYSICIAN:  Elmore Guise., M.D.DATE OF BIRTH:  August 14, 1956   DATE OF PROCEDURE:  DATE OF DISCHARGE:                              CARDIAC CATHETERIZATION   DATE OF PROCEDURE:  11/29/2005.   INDICATIONS FOR PROCEDURE:  Inferior ST-elevation MI.   DESCRIPTION:  The patient brought to the cardiac cath lab after appropriate  informed consent. She is prepped and draped in a sterile fashion.  Approximately 20 cc of 1% lidocaine was used for local anesthesia. A 5-  French sheath was placed in right femoral vein without difficulty. A 6-  French sheath was placed in the right femoral artery with guidance from a  Smart needle. Coronary angiography, LV angiography, left subclavian artery  angiography, and mid and distal aorta with renal angiography were then  performed. The patient tolerated the procedure well. No apparent  complications.   FINDINGS:  1.  Left Main: No significant disease.  2.  LAD: Proximal luminal irregularities with mid 60-70% stenosis after      branching of first diagonal. Distal vessel has mild luminal      irregularities.  3.  D1: Small to moderate size with a long proximal 90% stenosis.  4.  LCX: Is nondominant with mid 60% stenosis prior to bifurcation of large      OM vessel. She has moderate distal obstructive disease noted in her      native circ.  5.  OM-1: Moderate to large vessel with ostial 60-70% stenosis and proximal      50-60% stenosis with mid and distal luminal irregularities.  6.  Distal LAD and LCX gave left-to-right collaterals filling the PDA and      distal RCA vessels. RCA is dominant with 100% mid occlusion and visible      thrombus from the proximal to midportion. She does have a large LV      marginal branch with moderate diffuse disease and 60% proximal  stenosis,      70-80% mid stenosis.  7.  LV: EF is 55% with mild inferior hypokinesis. LVEDP was 12 millimeters      of mercury.  8.  Left subclavian artery is patent without any significant disease.  LIMA      is patent and ungrafted.  9.  Abdominal aortogram was performed secondary to difficulty passing the      wire. She does have patent renal arteries with an accessory renal noted      on the right, mild proximal tapering on both renal arteries on the right      with no significant obstructive disease. Her mid abdominal aorta shows      moderate diffuse irregularities with the distal aorta at the bifurcation      with the right and left iliac vessels showing a 40% narrowing involving      the ostium of the right iliac of approximately 50-60%. Right internal      and  external iliac, as well as common femoral without any significant      obstructive disease.   IMPRESSION:  1.  Obstructive three-vessel coronary disease with 100% mid RCA occlusion      and collateral flow noted from the left system.  2.  Preserved LV systolic function with an EF of 55% and mild inferior      hypokinesis.  3.  Distal aortic and right iliac peripheral vascular disease, as described.  4.  Patent renal arteries with no significant obstructive disease and      accessory right renal noted.   PLAN:  1.  We will continue her heparin, Integrilin, statin, and aspirin, as well      as nitroglycerin drip. Will obtain a CVTS consult for surgical      revascularization. Will continue to treat the patient very aggressively      with aggressive risk factor modification.      Elmore Guise., M.D.  Electronically Signed     TWK/MEDQ  D:  11/29/2005  T:  11/29/2005  Job:  [id number]

## 2011-02-16 NOTE — Op Note (Signed)
Diane [name], Diane [name]                ACCOUNT NO.:  [id number]   MEDICAL RECORD NO.:  [id number]          PATIENT TYPE:  INP   LOCATION:  2028                         FACILITY:  [facility]   PHYSICIAN:  Salvatore Decent. Dorris Fetch, M.D.DATE OF BIRTH:  [birth year]   DATE OF PROCEDURE:  12/04/2005  DATE OF DISCHARGE:                                 OPERATIVE REPORT   PREOPERATIVE DIAGNOSIS:  Three-vessel disease with unstable postinfarct  angina.   POSTOPERATIVE DIAGNOSIS:  Three-vessel disease with unstable postinfarct  angina.   PROCEDURE:  Median sternotomy, extracorporeal circulation, coronary artery  bypass grafting times five (left internal mammary artery to LAD, left radial  artery to ramus intermedius, saphenous vein graft to obtuse marginal 1,  sequential saphenous vein graft to acute marginal and posterior descending),  endoscopic vein harvest right leg open vein, left thigh.   CLINICAL NOTE:  Diane [name] is a 55 year old female who presented on March 1  with unstable postinfarct angina. She has had an out of hospital myocardial  infarction. The time of catheterization she had a totally occluded right  coronary with thrombus, but essentially had a completed myocardial  infarction. The patient was advised to undergo coronary bypass grafting.  The indications, risks, benefits and alternatives were discussed detail with  the patient. She understood, accepted risks and agreed to proceed.  She was  scheduled for surgery on 12/04/2005, on 12/02/2005 the patient signed out  against medical advice but returned the following morning with recurrent  unstable angina. Subsequently stabilized medically and on 12/04/2005 was  taken to surgery.  The patient's preoperative Allen's test was normal. This  was confirmed in the preop holding area with pulse oximetry.  The patient  understood the indications, risks and benefits of the procedure.   OPERATIVE NOTE:  Diane [name] was brought the preop holding  area on  12/04/2005. There lines were placed to monitor arterial, central venous,  pulmonary arterial pressure and intravenous antibiotics were administered.  The patient was taken to the operating room, anesthetized and intubated. The  chest, abdomen and legs and left arm were prepped and draped in usual  fashion.   Incision was made over the volar aspect of the left wrist overlying the  radial pulse, was carried through the skin, subcutaneous tissue. The  overlying fascial sheath was incised. The radial artery was exposed. The  short segment was then dissected out and a soft bulldog clamp was placed  across the artery.  There was an excellent pulse distally with proximal  occlusion confirming the results of the preoperative Allen's test as well as  the pulse oximetry. The decision was made to proceed with harvesting of the  left radial artery. The incision was extended to just below the antecubital  fossa and the radial artery was dissected in the usual fashion using  Harmonic scalpel.  The radial artery was of good caliber, although  relatively short length due to the patient's body habitus.  After dividing  the vessel distally there was excellent backbleeding from the distal stump.  This was suture-ligated with a 4-0 Prolene suture. The proximal stump  was  likewise suture-ligated.  The mammary was placed in a heparin papaverine  solution and the incision was closed in two layers with 3-0 Vicryl  subcutaneous suture and a 4-0 Vicryl subcuticular suture.   Simultaneously incision was made in the medial aspect of the right leg at  the level of the knee.  Greater saphenous vein was harvested from the right  thigh endoscopically. This was a small vessel and at the level of the knee  became too small to dissect out the more distally. Attempt was made to  localize the saphenous vein at the level of the knee on the left leg. This  was unsuccessful. Therefore additional vein which was  necessary was  harvested with bridged open incisions from the left groin.   A median sternotomy was performed and the left internal mammary artery was  harvested using standard technique. 5000 units of heparin was administered  while harvesting the left mammary artery and the saphenous vein.  The  remainder of the full heparin dose given prior to initiating cardiopulmonary  bypass. The left mammary was a good-quality vessel.  Note the patient did  have some sternal osteoporosis.   The pericardium was opened. The ascending aorta was inspected and palpated.  There was no evidence of atherosclerotic disease. The aorta was cannulated  via concentric 2-0 Ethibond pledgeted pursestring sutures. A dual stage  venous cannula was placed via pursestring suture the right atrial appendage.  Cardiopulmonary bypass was instituted and the patient was cooled to 32  degrees Celsius. The coronary arteries were inspected and anastomotic sites  were chosen.  Of note, the coronaries were diffusely diseased vessels.  The  vein was fair quality. The radial although short was of good quality as was  the left mammary artery.   The conduits were inspected and cut to length. A foam pad was placed in the  pericardium to protect the left phrenic nerve. A temperature probe was  placed the myocardial septum. The cardioplegia cannula placed in the  ascending aorta.   The aorta was crossclamped, left ventricle was emptied via aortic root vent.  Cardiac arrest was achieved with combination of cold antegrade blood  cardioplegia and topical iced saline. There was immediate rapid diastolic  arrest and adequate myocardial septal cooling with 600 mL cardioplegia. The  myocardial septal temperature was 8 degrees Celsius. The following distal  anastomoses were performed. First a saphenous vein graft was placed  sequentially to the acute marginal and posterior descending branches of right coronary. These were both diffusely  diseased poor quality targets. The  vein was fair.  The anastomoses were performed with running 7-0 Prolene  sutures. All anastomoses were probed proximally and distally at their  completion. There was adequate flow through the graft. Cardioplegia was  administered. There was good hemostasis at the anastomoses.   Next a reverse saphenous vein graft was placed end-to-side to the first  obtuse marginal, although large in caliber this was about a 2 mm vessel.  Only about a 1.5 mm probe would pass.  There was significant plaquing  throughout the vessel. The vein was of fair quality. The anastomosis was  performed with running 7-0 Prolene suture. There was good flow. Cardioplegia  was administered. There was good hemostasis.   Next, the distal end of the left radial artery was spatulated, was  anastomosed end-to-side to the ramus intermedius. This also was diffusely  diseased poor quality vessel.  The anastomosis was performed with a running  8-0 Prolene  suture. Graft flushed easily. Cardiac cardioplegia was  administered via the aortic root.  There was good backbleeding from the  ramus graft.   Next the left internal mammary artery was brought through a window in the  pericardium. The distal end was spatulated.  It was anastomosed end-to-side  to LAD. The LAD was the best of the targets. It was 1.5 mm in diameter and  although there was plaquing a probe passed easily proximally and distally  from the site the anastomosis. The mammary was a good-quality vessel.  It  was anastomosed end-to-side with running 8-0 Prolene suture.  At completion  of the mammary to LAD anastomosis, bulldog clamp was briefly removed from  the left mammary artery and immediate rapid septal rewarming was noted. The  bulldog clamp was replaced. Mammary pedicle was tacked to epicardial surface  of heart with 6-0 Prolene sutures.   Additional cardioplegia was administered. The vein grafts were cut to length  as was  the radial artery. Proximal vein graft and radial artery anastomoses  were performed to the ascending aorta. Running 7-0 Prolene suture was used  for the radial graft proximal and a running 6-0 Prolene suture was used for  the vein grafts.  A 4.0 mm punch aortotomy was made for each anastomosis,  the radial was anastomosed directly to the aorta as it was of better quality  than the vein grafts.   At completion of final proximal anastomoses the patient is placed in  Trendelenburg position. The bulldog clamp was again removed from the left  mammary artery. Immediate rapid septal rewarming was again noted. Lidocaine  was administered.  Aortic root was de-aired and aortic crossclamp was  removed. Total crossclamp time was 82 minutes.   The patient spontaneously resumed rhythm, did not require defibrillation.  All proximal and distal anastomoses were inspected for hemostasis during  rewarming. Epicardial pacing wires placed on the right ventricle, right atrium. When the patient rewarmed to a core temperature of 37 degrees  Celsius, she was weaned from cardiopulmonary bypass without difficulty. The  total bypass time was 132 minutes. Initial cardiac index was less than 2  liters per minute per meter squared but with volume resuscitation and  initiation of a low-dose dopamine infusion, the patient's cardiac index  improved to greater than 2 liters per minute per meter squared and she  remained hemodynamically stable throughout post bypass period.   The test dose of protamine was administered and was well tolerated.  The  atrial and aortic cannulae were removed. The remainder of protamine was  administered without incident. The chest was irrigated with 1 liter of warm  normal saline containing 1 gram of vancomycin. Hemostasis was achieved. Left  pleural and two mediastinal chest tubes were placed through separate  subcostal incisions. The pericardium was not reapproximated. The sternum was   closed with interrupted simple and figure-of-eight heavy gauge double  stainless steel wires. The remaining incisions closed in standard fashion.  All sponge, needle and instrument counts were correct at the end of  procedure. The patient was taken from the operating room to the surgical  intensive care unit in critical but stable condition.   The patient is not a candidate for redo bypass grafting with the exception  of the LAD.           ______________________________  Salvatore Decent. Dorris Fetch, M.D.     SCH/MEDQ  D:  12/04/2005  T:  12/05/2005  Job:  [id number]   cc:  Elmore Guise., M.D.  Fax: [phone]   Genene Churn. Love, M.D.  Fax: [phone]

## 2011-02-16 NOTE — Consult Note (Signed)
Diane [name], Diane [name]                ACCOUNT NO.:  [id number]   MEDICAL RECORD NO.:  [id number]          PATIENT TYPE:  INP   LOCATION:  1827                         FACILITY:  [facility]   PHYSICIAN:  Salvatore Decent. Hendrickson, M.D.DATE OF BIRTH:  [birth year]   DATE OF CONSULTATION:  11/29/2005  DATE OF DISCHARGE:                                   CONSULTATION   REASON FOR CONSULTATION:  Three vessel disease status post MI.   HISTORY OF PRESENT ILLNESS:  Diane [name] is a 56 year old female with a  history of hypertension, type 2 diabetes, tobacco abuse, COPD and  dyslipidemia and stroke, who presents with a chief complaint of chest pain.  Diane [name] first noted chest pain approximately 3 days ago. This then waxed  and waned over the next 36-48 hours until last night when she started having  pain that she described as 10/10. This was in her mid sternal area. It  radiated to her neck and both shoulders. The pain lasted about 3 hours. She  went to bed. The pain improved somewhat, and she was able to go to sleep.  She woke up this morning with moderate pain, and when she got out of bed she  also had some mild nausea and dizziness. She did not have diaphoresis and  did not complain of shortness of breath.   She came to the emergency room and was given aspirin and nitroglycerin,  which improved her pain. ECG showed an inferior ST segment MI. She was  brought to the catheterization laboratory, where she was found to have a  totally occluded right coronary with thrombus. She had collateral filling of  her posterior descending. She had significant disease in her left system as  well. Ejection fraction was 50% with inferior hypokinesis. LVEDP was 12. She  also had some aortoiliac disease. She is currently pain free post  catheterization.   PAST MEDICAL HISTORY:  1.  Adult onset type 2 diabetes.  2.  Hypertension.  3.  Dyslipidemia.  4.  Previous CVA secondary to carotid dissection.  5.  Tobacco  abuse.  6.  Emphysema.   MEDICATIONS ON ADMISSION:  Glipizide, Glucophage, Actos and Crestor.   ALLERGIES:  TETRACYCLINE.   FAMILY HISTORY:  Significant for coronary disease.   SOCIAL HISTORY:  She is single. She does smoke. She smokes 2 packs a day for  30 years. She does occasionally use ethanol, but not on a regular basis.   REVIEW OF SYSTEMS:  She had 3 episodes of bronchitis require antibiotics in  December or January, and had been feeling well since then. No exertional  chest pain leading up to the acute event. Denies any wheezing, orthopnea,  paroxysmal nocturnal dyspnea or peripheral edema. No history of blood clots  or excessive bleeding. Occasional residual slurred speech and problems with  balance secondary to her stroke. No focal motor deficits. All other systems  are negative.   PHYSICAL EXAMINATION:  Diane [name] is a small, 55 year old female in no acute  distress. She is 4'9 tall. She is overweight. Her blood pressure is 143/90,  pulse is  88 and regular, respirations are 13. Her general appearance is well  developed, well nourished. HEENT exam is unremarkable. Neck is supple  without thyromegaly, adenopathy or bruits. Cardiac exam has regular rate and  rhythm, normal S1-S2, no rubs, murmurs or gallops. Abdomen is soft,  nontender. Lungs have diminished breath sounds at the bases, otherwise  clear. There is no wheezing. Extremities are without clubbing, cyanosis or  edema. She has 2+ radial dorsalis pedis and posterior tibial pulses  bilaterally. She has a normal Allen test on the left side.   LABORATORY DATA:  CPK was 7.3, myoglobin 98, troponin was 0.06. Her EKG  showed ST elevations inferiorly. Her sodium was 144, potassium 3.9, BUN and  creatinine 13 and 0.8, glucose 142. Hemoglobin A1c is 7.5. Cholesterol is  150, triglycerides 116, HDL was 40, LDL 87. ALT was 42.   IMPRESSION:  Diane [name] is a 55 year old, diabetic female with multiple  cardiac risk factors,  including diabetes, hypertension, dyslipidemia and  heavy tobacco abuse. She presents with an out of hospital acute myocardial  infarction. She is now pain free and well over 12 hours out from the acute  event. She had had waxing and waning pain for 2 days prior to the actual  event. Her troponin is only marginally elevated currently, but would expect  to see those rise over the next 24-48 hours. At catheterization she has  severe 3 vessel coronary disease. Her ejection fraction is approximately 50%  with some inferior hypokinesis.   She would best be served in the long run by coronary bypass grafting. I have  discussed in detail with her the nature and extent of the operation. She has  2 family members who have had the surgery, and she is familiar with the  nature and extent of the operation, as well as the overall recovery. She  understands that the risks of surgery include, but are not limited to death,  stroke, MI, DVT, PE, bleeding, possible need for transfusions, infection, as  well as other organs and system dysfunction, including respiratory, renal or  GI complications. She understands and accepts these risks and agrees to  proceed. As she has had a completed outpatient or out of hospital myocardial  infarction, it would be best to wait 4-5 days for recovery of stunned  myocardium before proceeding with surgery. Obviously, if she were to develop  unstable post infarct symptoms, that could change. We will tentatively  schedule her for the OR on Monday, March 6. All the patient's questions were  answered.           ______________________________  Salvatore Decent Dorris Fetch, M.D.     SCH/MEDQ  D:  11/29/2005  T:  11/29/2005  Job:  [id number]   cc:   Elmore Guise., M.D.  Fax: [phone]

## 2011-02-16 NOTE — Assessment & Plan Note (Signed)
[facility]                               PULMONARY OFFICE NOTE   ORRA, NOLDE                       MRN:          [id number]  DATE:04/29/2006                            DOB:          24-Jun-1956    REASON FOR VISIT:  Ms. Loudin is a 55 year old female whom I previously  evaluated here for obstructive sleep apnea, last seen on March 05, 2006.  The  patient has since then had her sleep study completed.  She had a very  positive sleep study, with her RDI being 105.  This classifies as severe  obstructive apnea.  The patient since then has been placed on an Autoset  machine.  She actually states that she has been sleeping better with the  Autoset machine.  We do not have the download yet because the patient was  unable to remove the card from the machine; however, again, she feels much  rested.   The patient has noted decrease in daytime somnolence.   CURRENT MEDICATIONS:  As noted on the intake sheet.  These have been  reviewed and are accurate.   PHYSICAL EXAMINATION:  VITAL SIGNS:  As noted.  Oxygen saturation 96% on  room air.  GENERAL:  This is an obese white female who is in no acute distress.  HEENT:  Unremarkable.  NECK:  Supple.  No adenopathy noted.  No JVD.  LUNGS:  Clear to auscultation bilaterally.  CARDIAC:  Regular rate and rhythm.  No rubs, murmurs, or gallops heard.  EXTREMITIES:  The patient has no cyanosis, no clubbing, and no edema noted.   IMPRESSION:  1.  Very severe obstructive sleep apnea with an respiratory distress index      of 105.  The patient is currently undergoing autotitration to determine      final pressure necessary.  2.  History of edema.  The patient is currently off Actos, with improvement      in that regard.   PLAN:  1.  The plan will be for the patient to continue the Autoset device as it is      right now.  We will wait for the final download.  2.  Continue other medications as they are.  3.   Follow up will be in four to six weeks' time.  She is to contact us      prior to that time should any problems arise.                                   [name], MD  CLG/MedQ  DD:  04/29/2006  DT:  04/30/2006  Job #:  [id number]   cc:   Marne A. Milinda Antis, MD  Elmore Guise., MD  Salvatore Decent Dorris Fetch, MD

## 2011-02-16 NOTE — Discharge Summary (Signed)
NAMETONDA, WIEDERHOLD                ACCOUNT NO.:  [id number]   MEDICAL RECORD NO.:  [id number]          PATIENT TYPE:  INP   LOCATION:  2032                         FACILITY:  [facility]   PHYSICIAN:  Elmore Guise., M.D.DATE OF BIRTH:  [birth year]   DATE OF ADMISSION:  11/29/2005  DATE OF DISCHARGE:  12/02/2005                                 DISCHARGE SUMMARY   DISCHARGE DIAGNOSES:  1.  Status post non-ST-elevation myocardial infarction.  2.  Multivessel coronary artery disease.  3.  Diabetes mellitus.  4.  Dyslipidemia.  5.  Hypertension.  6.  History of tobacco dependence.   HISTORY OF PRESENT ILLNESS:  The patient was a very pleasant 55 year old  female who was admitted with increasing substernal chest pain.   HOSPITAL COURSE:  The patient underwent cardiac catheterization on November 29, 2005, which showed multivessel obstructive disease.  She had no significant  left main disease.  LAD showed mid 60% stenosis after branching of first  diagonal.  First diagonal was small to moderate size vessel with long  proximal 90% stenosis.  Circumflex was nondominant with mid 60% stenosis  prior to the bifurcation of large OM-1 vessel with moderate distal  obstructive disease.  OM-1 was moderate to large size vessel with ostial 60-  70% stenosis and proximal 50-60% stenosis.  She had mid and distal luminal  irregularities of this vessel.  Her distal LAD and circumflex system give  left-to-right collaterals filling the PDA and distal RCA.  Her RCA was  dominant with 100% mid occlusion and thrombus noted.  She did have a large  RV marginal branch with 60% proximal and 70-80% mid obstruction.  Following  her heart catheterization she was treated aggressively with continued  Lovenox and Integrilin.  A CT surgery consult was obtained.  The patient was  scheduled for surgery early the next week.  She remained chest pain free.  However, on December 02, 2005, the patient refused any further treatment  and  left the hospital against medical advice.  She understood the risk of  leaving the hospital including cardiac death from her recent MI as well as  multivessel obstructive disease; however, she gladly signed out AMA.      Elmore Guise., M.D.  Electronically Signed     TWK/MEDQ  D:  01/24/2006  T:  01/25/2006  Job:  [id number]

## 2011-02-16 NOTE — Cardiovascular Report (Signed)
NAMEKANSAS, SPAINHOWER                ACCOUNT NO.:  [id number]   MEDICAL RECORD NO.:  [id number]          PATIENT TYPE:  OIB   LOCATION:  2004                         FACILITY:  [facility]   PHYSICIAN:  Elmore Guise., M.D.DATE OF BIRTH:  1955-10-10   DATE OF PROCEDURE:  08/06/2006  DATE OF DISCHARGE:                              CARDIAC CATHETERIZATION   INDICATIONS FOR PROCEDURE:  Continued chest pain with abnormal stress test  showing a moderate-sized inferolateral defect.   DESCRIPTION OF PROCEDURE:  The patient was brought to the cardiac cath lab  after appropriate informed consent.  She was prepped and draped in a sterile  fashion.  Approximately 15 mL of 1% lidocaine was used for local anesthesia.  A Smart needle was used for access in the right femoral artery.  A 6-French  sheath was placed over a retained wire.  Coronary angiography, LV  angiography, vein graft and LIMA angiography were then performed.  A  localized root shot was also performed to show if any grafts were patent.  LV angiography was performed with hand injection to save contrast load.   FINDINGS:  1. Left main:  Long vessel with mild luminal irregularities.  2. LAD:  Diffuse moderate proximal disease 50-60% stenosis at bifurcation      with first diagonal and first septal perforator with 100% mid      occlusion, first diagonal was a small vessel with 100% proximal      occlusion.  3. Ramus intermedius:  Moderate proximal disease branching into two distal      vessels with a high branch showing a small vessel with 60-70% proximal      stenosis.  4. LCX:  Is non-dominant with mild proximal luminal irregularities, with      mid long 60-70% stenosis prior to the bifurcation of OM-1 and OM-2.  OM-      1 has sequential proximal 70-80% stenosis prior to the insertion of      vein graft stump (vein graft is occluded).  OM-2 has a subtotal      occlusion.  There is filling with diffuse left-to-left collaterals  filling the distal circumflex and OM-2 vessels.  5. RCA:  Has 100% proximal occlusion with faint filling of the mid and      distal vessels with right-to-right collaterals.   Vein graft and radial and LIMA angiography were then performed:  1. Vein graft to OM is occluded.  2. Radial to ramus intermedius is occluded.  3. Vein graft to PDA and RV marginal is occluded.  4. LIMA to LAD is patent without significant disease.  Good flow to mid      and distal LAD with back fill into proximal LAD.  5. LV EF is 45-50% with mild to moderate inferior hypokinesis.   A localized aortic root shot was performed showing no patent grafts.   IMPRESSION:  1. Obstructive multivessel coronary artery disease.  2. Occluded vein graft to obtuse marginal-1, vein graft to posterior      descending artery and right ventricle marginal and occluded radial to  ramus intermedius.  3. Patent left internal mammary artery to the left anterior descending      artery.  4. Mild left ventricle dysfunction with an ejection fraction of 45% with      inferior hypokinesis.   PLAN:  Aggressive medical therapy.  We will discuss interventional options  of LCX and OM vessels with Dr. [place].      Elmore Guise., M.D.  Electronically Signed     TWK/MEDQ  D:  08/06/2006  T:  08/06/2006  Job:  [id number]

## 2011-02-16 NOTE — Procedures (Signed)
Diane [name], Diane [name]                ACCOUNT NO.:  [id number]   MEDICAL RECORD NO.:  [id number]          PATIENT TYPE:  OUT   LOCATION:  SLEEP CENTER                 FACILITY:  [facility]   PHYSICIAN:  Marcelyn Bruins, M.D. [facility] DATE OF BIRTH:  07/18/1956   DATE OF STUDY:  03/24/2006                              NOCTURNAL POLYSOMNOGRAM   REFERRING PHYSICIAN:  Danice Goltz, M.D. [facility].   INDICATION FOR STUDY:  Hypersomnia with sleep apnea.   EPWORTH SLEEPINESS SCORE:  18   SLEEP ARCHITECTURE:  The patient had a total sleep time of 366 minutes with  adequate slow wave sleep but never achieved REM.  Sleep onset latency was  normal and sleep efficiency was decreased at 84%.   RESPIRATORY DATA:  The patient underwent split night protocol where she was  found to have 221 events in the first 127 minutes of sleep.  This gave her  an extrapolated respiratory disturbance index of 105 events per hour with O2  desaturation as well.  The events were not positional but there was severe  snoring noted throughout.  By protocol, the patient was then placed on a  small Ultram Mirage full face mask and CPAP titration was initiated.  The  sleep technologist was concerned at one point during the study with central  apneas while on CPAP, however, these occurred even before the patient was  started on CPAP.  The pressure was increased as high as 15-cmH2O and there  continued to be breakthrough hypopneas and obstructive apneas.  Further  titration was not done because of the concern about these central apneas.   OXYGEN DATA:  The patient had O2 desaturation as low as 85% with her  obstructive events.   CARDIAC DATA:  No clinically significant cardiac arrhythmias were noted.   MOVEMENT-PARASOMNIA:  The patient was found to have 179 leg jerks with three  per hour resulting in arousal or awakening.   IMPRESSIONS-RECOMMENDATIONS:  1.  Severe obstructive sleep apnea noted during split night study with a  respiratory disturbance index of 105 events per hour during the first      half of the night and oxygen desaturation as low as 85%.  The patient      was then placed on a small Ultram Mirage full face mask and ultimately      titrated to 15-cmH2O pressure with persistent breakthrough apneas and      hypopneas.  I believe the patient is probably going to need more      pressure, however, clinical correlation is suggested.  2.  Large numbers of leg jerks with what appears to be significant sleep      disruption.  It is unclear how much of this      is due to the patient's sleep disordered breathing and whether or not      she may have a primary movement disorder as well.  Clinical correlation      is suggested after appropriate treatment of her obstructive sleep apnea.           ______________________________  Marcelyn Bruins, M.D. [facility]  Diplomate, American Board of Sleep  Medicine  KC/MEDQ  D:  04/10/2006 11:31:47  T:  04/10/2006 12:16:11  Job:  [id number]

## 2011-02-16 NOTE — Cardiovascular Report (Signed)
Diane [name], OPPERMAN                ACCOUNT NO.:  [id number]   MEDICAL RECORD NO.:  [id number]          PATIENT TYPE:  INP   LOCATION:  6527                         FACILITY:  [facility]   PHYSICIAN:  Peter M. Swaziland, M.D.  DATE OF BIRTH:  1956/01/23   DATE OF PROCEDURE:  08/08/2006  DATE OF DISCHARGE:                              CARDIAC CATHETERIZATION   INDICATIONS:  The patient is a 55 year old white female with advanced  coronary disease.  She is status post coronary bypass surgery in March of  2007.  She presented with recurrent anginal symptoms and had a positive  stress Cardiolite study.  Subsequent cardiac catheterization demonstrated  patent LIMA graft to the LAD, but all other grafts were occluded.  The right  coronary was a very tiny vessel and was supplied by collaterals.  It did not  appear suitable for re-intervention.  There was also an intermediate branch  that was relatively small in caliber.  However, the left circumflex was a  fairly large vessel, and it supplied a large obtuse marginal vessel.  This  vessel had diffuse disease from the mid circumflex down through the proximal  marginal vessel.  The worst lesion was approximately 90% with diffuse 80%  narrowing of its entire segment.  It was recommended that we attempt to  treat this percutaneously.  Procedure is intracoronary stenting of the left  circumflex and obtuse marginal coronary artery.  Access was via the right  femoral artery using the standard Seldinger technique equipment, 6-French  arterial sheath, 6-French left Voda 4 guide, a 0.014 PT2 wire, a 2.5 x 15-mm  Firestorm balloon, a 2.5 x 16-mm Taxus stent, a 2.75 x 20-mm Taxus stent.   MEDICATIONS:  Local anesthesia with 1% Xylocaine, Versed 1 mg IV, Angiomax  bolus at 0.75 mg/kg with subsequent ACT of 325, nitroglycerin 200 mcg  intracoronary x2 and Angiomax drip at 1.75 mg per kg per hour.   CONTRAST:  165 mL of Omnipaque.   HEMODYNAMIC DATA:  The patient  remained hemodynamically stable throughout  the procedure   PROCEDURE NOTE:  After initial guide shots were obtained, we proceeded with  intervention of the left circumflex coronary and obtuse marginal vessel.  It  was very difficult to pass a wire down this vessel due to acute angulation  of the left circumflex from the left main coronary.  We were unable to pass  an [place] medium wire.  A Whisper wire was able to make the bend but could  only be passed about midway down the vessel and was unable to cross the  lesion itself.  We then attempted ___________ wire with a [facility], but it  was unable to pass any further.  We attempted to pass a balloon around the  curve of the takeoff, but there was inadequate wire support to pass a  balloon to give [place] added support.  We next used a PT2 wire, and with the  Whisper wire partly down the vessel, we were able to pass successfully PT2  wire down the circumflex and into the distal obtuse marginal branch.  We  then performed predilatation of the entire lesion segment using a 2.5 x 15-  mm Firestorm balloon and dilating throughout the segment up to 10  atmospheres x4.  We then exchanged for a 2.5 x 16-mm Taxus stent which was  deployed in the distal part of the lesion.  This was deployed at 9  atmospheres and post dilated to 14 atmospheres.  This yielded excellent  angiographic result with 0% residual stenosis.  We then placed a second 2.75  x 20-mm Taxus stent in overlapping fashion to cover the proximal portion of  the lesion.  This was also deployed at 9 atmospheres and post dilated to 14  atmospheres.  This yielded excellent angiographic result throughout the  entire lesion with 0% residual stenosis and TIMI grade 3 flow.  The patient  tolerated the procedure well without complications.   FINAL INTERPRETATION:  Successful intracoronary stenting of the mid left  circumflex coronary artery and proximal first obtuse marginal vessel.  This  was a  complex intervention.   PLAN:  Continue aspirin and Plavix indefinitely.  Angiomax discontinued at  the end of the procedure.           ______________________________  Peter M. Swaziland, M.D.     PMJ/MEDQ  D:  08/08/2006  T:  08/09/2006  Job:  [id number]   cc:   Marne A. Milinda Antis, MD  Elmore Guise., M.D.  Salvatore Decent Dorris Fetch, M.D.

## 2011-03-23 ENCOUNTER — Encounter: Payer: Self-pay | Admitting: *Deleted

## 2011-03-26 ENCOUNTER — Encounter: Payer: Self-pay | Admitting: *Deleted

## 2011-03-30 ENCOUNTER — Ambulatory Visit: Payer: Medicare Other | Admitting: Cardiology

## 2011-04-20 ENCOUNTER — Other Ambulatory Visit: Payer: Self-pay | Admitting: *Deleted

## 2011-04-20 MED ORDER — NITROGLYCERIN 0.4 MG SL SUBL: 0.4000 mg | SUBLINGUAL_TABLET | SUBLINGUAL | Status: DC | PRN

## 2011-04-20 NOTE — Telephone Encounter (Signed)
Refill on NTG faxed to [facility] [phone]

## 2011-04-25 ENCOUNTER — Telehealth: Payer: Self-pay | Admitting: Cardiology

## 2011-04-25 NOTE — Telephone Encounter (Signed)
Wanted directions on NTG spray. Advised to use every 5 min x 3 over 15 min for chest pain; if no relief go to ER.

## 2011-04-25 NOTE — Telephone Encounter (Signed)
Needs clarification regarding refill on the Nitroglycerine spray  Reference [phone]

## 2011-06-05 ENCOUNTER — Other Ambulatory Visit: Payer: Self-pay | Admitting: Cardiology

## 2011-06-05 MED ORDER — ISOSORBIDE MONONITRATE ER 60 MG PO TB24: 60.0000 mg | ORAL_TABLET | Freq: Two times a day (BID) | ORAL | Status: DC

## 2011-06-05 NOTE — Telephone Encounter (Signed)
escribe medication per fax request  

## 2011-06-05 NOTE — Telephone Encounter (Signed)
Patient wants to speak with RN regarding her medication through mail order.

## 2011-06-06 ENCOUNTER — Other Ambulatory Visit: Payer: Self-pay | Admitting: *Deleted

## 2011-06-06 ENCOUNTER — Other Ambulatory Visit: Payer: Self-pay

## 2011-06-06 ENCOUNTER — Other Ambulatory Visit: Payer: Self-pay | Admitting: Cardiology

## 2011-06-06 MED ORDER — POTASSIUM CHLORIDE 10 MEQ PO TBCR: 10.0000 meq | EXTENDED_RELEASE_TABLET | Freq: Two times a day (BID) | ORAL | Status: DC

## 2011-06-06 MED ORDER — ISOSORBIDE MONONITRATE ER 60 MG PO TB24: 60.0000 mg | ORAL_TABLET | Freq: Two times a day (BID) | ORAL | Status: DC

## 2011-06-06 MED ORDER — RANOLAZINE ER 500 MG PO TB12: 500.0000 mg | ORAL_TABLET | Freq: Two times a day (BID) | ORAL | Status: DC

## 2011-06-06 MED ORDER — ROSUVASTATIN CALCIUM 10 MG PO TABS: 10.0000 mg | ORAL_TABLET | Freq: Every day | ORAL | Status: DC

## 2011-06-06 MED ORDER — MONTELUKAST SODIUM 10 MG PO TABS: 10.0000 mg | ORAL_TABLET | Freq: Every day | ORAL | Status: DC

## 2011-06-06 MED ORDER — CARVEDILOL 6.25 MG PO TABS: 6.2500 mg | ORAL_TABLET | Freq: Two times a day (BID) | ORAL | Status: DC

## 2011-06-06 NOTE — Telephone Encounter (Signed)
Pharmacy called to get refill on Carvedolol, Renexa, Imdur Extended Release.  Please call in to pharmacy.  Ref # [id number].  Pharmacy is aware since after 4pm may not receive call back until tomorrow.

## 2011-06-06 NOTE — Telephone Encounter (Signed)
Form done in IN box 

## 2011-06-06 NOTE — Telephone Encounter (Signed)
Advised pharm that these meds had been sent by surescript this AM. But gave him verbal order for all 3 Rx; Carvedilol;renaxa;Imdur.

## 2011-06-06 NOTE — Telephone Encounter (Signed)
[facility] faxed form for refills on Pot Chlor ER 48meq,Crestor 10mg  and Singulair 10mg . Potassium was not on pt's med list in Epic but it was on pt's med list in [employer]. I called pt and she is still taking Potassium one tab by mouth twice a day. Crestor 10mg  one daily and Singulair 10mg  taking one daily.Pt already has scheduled appt with Dr Milinda Antis on 06/13/11.Please advise.  form is on your shelf in the in box.

## 2011-06-06 NOTE — Telephone Encounter (Signed)
escribe medication per fax request  

## 2011-06-06 NOTE — Telephone Encounter (Signed)
Completed form faxed to [phone] as instructed.

## 2011-06-07 ENCOUNTER — Other Ambulatory Visit: Payer: Self-pay | Admitting: *Deleted

## 2011-06-07 MED ORDER — CARVEDILOL 12.5 MG PO TABS: ORAL_TABLET | ORAL | Status: DC

## 2011-06-07 NOTE — Telephone Encounter (Signed)
escribe medication per fax request  

## 2011-06-13 ENCOUNTER — Encounter: Payer: Self-pay | Admitting: Family Medicine

## 2011-06-13 ENCOUNTER — Ambulatory Visit (INDEPENDENT_AMBULATORY_CARE_PROVIDER_SITE_OTHER): Payer: Medicare Other | Admitting: Family Medicine

## 2011-06-13 DIAGNOSIS — E119 Type 2 diabetes mellitus without complications: Secondary | ICD-10-CM

## 2011-06-13 DIAGNOSIS — F17200 Nicotine dependence, unspecified, uncomplicated: Secondary | ICD-10-CM

## 2011-06-13 DIAGNOSIS — E785 Hyperlipidemia, unspecified: Secondary | ICD-10-CM

## 2011-06-13 DIAGNOSIS — R42 Dizziness and giddiness: Secondary | ICD-10-CM

## 2011-06-13 DIAGNOSIS — I10 Essential (primary) hypertension: Secondary | ICD-10-CM

## 2011-06-13 MED ORDER — DOCUSATE SODIUM 100 MG PO CAPS: 100.0000 mg | ORAL_CAPSULE | Freq: Two times a day (BID) | ORAL | Status: DC

## 2011-06-13 MED ORDER — CVS NATURAL FISH OIL 1200 MG PO CAPS: 1.0000 | ORAL_CAPSULE | Freq: Two times a day (BID) | ORAL | Status: DC

## 2011-06-13 MED ORDER — GUAIFENESIN ER 600 MG PO TB12: 1200.0000 mg | ORAL_TABLET | Freq: Two times a day (BID) | ORAL | Status: DC

## 2011-06-13 MED ORDER — ASPIRIN 325 MG PO TABS: 325.0000 mg | ORAL_TABLET | Freq: Every day | ORAL | Status: DC

## 2011-06-13 NOTE — Progress Notes (Signed)
Subjective:    Patient ID: Diane [name], female    DOB: [birth year], 55 y.o.   MRN: [id number]  HPI Here for f/u of chronic health problems- HTN and DM and lipids Is doing ok overall  Wt is down 9 lb bmi is 34  She missed a bunch of appts in dec because she forgot them  Sees Dr Swaziland next mo Missed her appt with Dr Talmage Nap - she will make her own appt  ? Why that happened to her - just forgot   DM on amaryl 4 mg Sugars are high - does not take insulin  opthy- that is due - will make own appt   Lipids- crestor- needs that checked  Has cad Eating better - lost wt too    HTN first bp is 144/78 No cp or sob or edema   Tab status- same , has the patches to quit and also gum= and elect cig -- trying that  Has done the class at [facility] before   Needs all her otc meds written for reimbursment also today  Patient Active Problem List  Diagnoses  . DIABETES MELLITUS, TYPE II  . HYPERLIPIDEMIA  . ANXIETY  . TOBACCO USE  . DEPRESSION  . OBSTRUCTIVE SLEEP APNEA  . MIGRAINE HEADACHE  . CARPAL TUNNEL SYNDROME, BILATERAL  . CERUMEN IMPACTION, BILATERAL  . HYPERTENSION  . CORONARY ARTERY DISEASE  . CVA  . ALLERGIC RHINITIS  . GERD  . FIBROMYALGIA  . DIZZINESS  . OTHER DYSPHAGIA   Past Medical History  Diagnosis Date  . Coronary artery disease     post CABG in 3/07   . Hypertension   . Hyperlipidemia   . Dyslipidemia   . Diabetes mellitus     type 2  . COPD (chronic obstructive pulmonary disease)    Past Surgical History  Procedure Date  . Coronary stent placement 08/11/06    PCI of her ciurcumflex/OM vessel  . Cardiac catheterization 11/29/05    EF of 55%  . Cardiac catheterization 08/06/06    EF of 45-50%  . Coronary artery bypass graft 12/04/2005    x5 -- left internal mammary artery to the LAD, left radial artery to the ramus intermedius, saphenous vein graft to the obtuse marginal 1, sequential saphenous vein grat to the acute marginal and posterior descending,  endoscopic vein harvesting from the left thigh with open vein harvest from right leg   History  Substance Use Topics  . Smoking status: Current Some Day Smoker -- 1 years  . Smokeless tobacco: Not on file  . Alcohol Use:    Family History  Problem Relation Age of Onset  . Coronary artery disease    . Heart disease     Allergies  Allergen Reactions  . Ace Inhibitors     REACTION: cough  . Eggs Or Egg-Derived Products     REACTION: reaction not know, no flu shots  . Tetracycline     REACTION: reaction not known   Current Outpatient Prescriptions on File Prior to Visit  Medication Sig Dispense Refill  . albuterol (PROVENTIL,VENTOLIN) 90 MCG/ACT inhaler Inhale 2 puffs into the lungs as needed.        . carvedilol (COREG) 12.5 MG tablet Take one tablet twice daily  180 tablet  3  . clopidogrel (PLAVIX) 75 MG tablet Take 75 mg by mouth daily.        . cyclobenzaprine (FLEXERIL) 10 MG tablet Take 10 mg by mouth as needed.        [name]  hydrochlorothiazide 50 MG tablet Take 50 mg by mouth daily.        . isosorbide mononitrate (IMDUR) 60 MG 24 hr tablet Take 1 tablet (60 mg total) by mouth 2 (two) times daily.  180 tablet  3  . nitroGLYCERIN (NITROSTAT) 0.4 MG SL tablet Place 1 tablet (0.4 mg total) under the tongue every 5 (five) minutes as needed.  25 tablet  11  . potassium chloride (KLOR-CON) 10 MEQ CR tablet Take 1 tablet (10 mEq total) by mouth 2 (two) times daily.  180 tablet  3  . ranolazine (RANEXA) 500 MG 12 hr tablet Take 1 tablet (500 mg total) by mouth 2 (two) times daily.  180 tablet  3  . rosuvastatin (CRESTOR) 10 MG tablet Take 1 tablet (10 mg total) by mouth daily.  90 tablet  3  . sitaGLIPtan-metformin (JANUMET) 50-1000 MG per tablet Take 1 tablet by mouth 2 (two) times daily with a meal.        . tiotropium (SPIRIVA) 18 MCG inhalation capsule Place 18 mcg into inhaler and inhale daily.        . traMADol (ULTRAM) 50 MG tablet Take 50 mg by mouth 2 (two) times daily.        [name]  albuterol (PROVENTIL) (2.5 MG/3ML) 0.083% nebulizer solution Take 2.5 mg by nebulization as directed.        [name] buPROPion (WELLBUTRIN XL) 300 MG 24 hr tablet Take 300 mg by mouth daily.        . busPIRone (BUSPAR) 15 MG tablet Take 15 mg by mouth 2 (two) times daily.        . fluticasone (FLONASE) 50 MCG/ACT nasal spray Place 2 sprays into the nose daily.        [name] OMEPRAZOLE PO Take 1 capsule by mouth 2 (two) times daily.        [name] telmisartan (MICARDIS) 40 MG tablet Take 40 mg by mouth daily.           Review of Systems Review of Systems  Constitutional: Negative for fever, appetite change,  and unexpected weight change. pos for chronic fatigue  Eyes: Negative for pain and visual disturbance.  Respiratory: Negative for cough and shortness of breath.   Cardiovascular: Negative for cp or palpitations    Gastrointestinal: Negative for nausea, diarrhea and constipation.  Genitourinary: Negative for urgency and frequency.  Skin: Negative for pallor or rash  MSK pos for chronic back pain   Neurological: Negative for weakness, light-headedness, numbness and headaches.  Hematological: Negative for adenopathy. Does not bruise/bleed easily.  Psychiatric/Behavioral: Negative for dysphoric mood. The patient is not nervous/anxious.          Objective:   Physical Exam  Constitutional: She appears well-developed and well-nourished. No distress.       overwt and well appearing   HENT:  Head: Normocephalic and atraumatic.  Mouth/Throat: Oropharynx is clear and moist.  Eyes: Conjunctivae and EOM are normal. Pupils are equal, round, and reactive to light.  Neck: Normal range of motion. Neck supple. No JVD present. No thyromegaly present.  Cardiovascular: Normal rate, regular rhythm, normal heart sounds and intact distal pulses.   Pulmonary/Chest: Effort normal and breath sounds normal. No respiratory distress. She has no wheezes.       Diffusely distant bs  Pt smells strongly of smoke  Abdominal:  Soft. Bowel sounds are normal. She exhibits no distension and no mass. There is no tenderness.  Musculoskeletal: She exhibits no edema and no  tenderness.       Poor rom LS   Lymphadenopathy:    She has no cervical adenopathy.  Neurological: She is alert. She has normal reflexes. No cranial nerve deficit. Coordination normal.  Skin: Skin is warm and dry. No rash noted. No erythema. No pallor.  Psychiatric: She has a normal mood and affect.          Assessment & Plan:

## 2011-06-13 NOTE — Patient Instructions (Addendum)
Make sure to follow up with Dr Lolita Rieger Dr Talmage Nap , get your eye exam too Call back with your name brand and dose of calcium and vitamin D so I can print a px  Labs today Follow up with me in about 6  Months Here are your written otc px

## 2011-06-14 LAB — LIPID PANEL
Cholesterol: 122 mg/dL (ref 0–200)
HDL: 39.5 mg/dL (ref >39.00)
LDL Cholesterol: 46 mg/dL (ref 0–99)
Total CHOL/HDL Ratio: 3
Triglycerides: 181 mg/dL — ABNORMAL HIGH (ref 0.0–149.0)
VLDL: 36.2 mg/dL (ref 0.0–40.0)

## 2011-06-14 LAB — COMPREHENSIVE METABOLIC PANEL
ALT: 15 U/L (ref 0–35)
AST: 17 U/L (ref 0–37)
Albumin: 3.8 g/dL (ref 3.5–5.2)
Alkaline Phosphatase: 69 U/L (ref 39–117)
BUN: 9 mg/dL (ref 6–23)
CO2: 27 mEq/L (ref 19–32)
Calcium: 9.4 mg/dL (ref 8.4–10.5)
Chloride: 99 mEq/L (ref 96–112)
Creatinine, Ser: 0.6 mg/dL (ref 0.4–1.2)
GFR: 111.56 mL/min (ref >60.00)
Glucose, Bld: 154 mg/dL — ABNORMAL HIGH (ref 70–99)
Potassium: 4.4 mEq/L (ref 3.5–5.1)
Sodium: 135 mEq/L (ref 135–145)
Total Bilirubin: 0.4 mg/dL (ref 0.3–1.2)
Total Protein: 6.8 g/dL (ref 6.0–8.3)

## 2011-06-14 LAB — TSH: TSH: 1.59 u[IU]/mL (ref 0.35–5.50)

## 2011-06-14 NOTE — Assessment & Plan Note (Signed)
Overall a bit better but still poor balance Pending test results from her neuro eval - dopplers and ncv

## 2011-06-14 NOTE — Assessment & Plan Note (Signed)
Disc in detail risks of smoking and possible outcomes including copd, vascular/ heart disease, cancer , respiratory and sinus infections  Pt voices understanding  She is trying to quit with otc aides

## 2011-06-14 NOTE — Assessment & Plan Note (Signed)
Checking lipids today with crestor and hx CAD Rev last labs Rev low sat fat diet

## 2011-06-14 NOTE — Assessment & Plan Note (Signed)
Per pt sugars are generally high  Will return to Dr Talmage Nap for management of this Has lost wt Per pt just started eating better

## 2011-06-14 NOTE — Assessment & Plan Note (Signed)
bp better on 2nd check Urged her to check at home also  Disc exercise low impact

## 2011-06-26 ENCOUNTER — Other Ambulatory Visit: Payer: Self-pay | Admitting: Pulmonary Disease

## 2011-06-28 ENCOUNTER — Other Ambulatory Visit: Payer: Self-pay | Admitting: Family Medicine

## 2011-06-28 NOTE — Telephone Encounter (Signed)
Form from [facility] requesting desloratadine in your IN box. Not on med list

## 2011-06-29 NOTE — Telephone Encounter (Signed)
Thank you - form in IN box

## 2011-06-29 NOTE — Telephone Encounter (Signed)
Pt said she is not taking the Clarinex or the Singulair.

## 2011-06-29 NOTE — Telephone Encounter (Signed)
Please ask pt if she is taking this (clarinex) - and let me know  I will hold form on my desk-thanks

## 2011-06-29 NOTE — Telephone Encounter (Signed)
Completed form faxed to [phone].

## 2011-07-13 ENCOUNTER — Ambulatory Visit: Payer: Medicare Other | Admitting: Cardiology

## 2011-08-09 ENCOUNTER — Telehealth: Payer: Self-pay | Admitting: *Deleted

## 2011-08-09 DIAGNOSIS — I779 Disorder of arteries and arterioles, unspecified: Secondary | ICD-10-CM

## 2011-08-09 NOTE — Telephone Encounter (Signed)
Called to schedule her yearly carotid dopplers. Has an app 11/19 w/Dr. Swaziland. Would like to have doppler prior to OV so he can discuss results.

## 2011-08-13 ENCOUNTER — Encounter (INDEPENDENT_AMBULATORY_CARE_PROVIDER_SITE_OTHER): Payer: Medicare Other | Admitting: *Deleted

## 2011-08-13 DIAGNOSIS — R0989 Other specified symptoms and signs involving the circulatory and respiratory systems: Secondary | ICD-10-CM

## 2011-08-13 DIAGNOSIS — I779 Disorder of arteries and arterioles, unspecified: Secondary | ICD-10-CM

## 2011-08-13 DIAGNOSIS — I6529 Occlusion and stenosis of unspecified carotid artery: Secondary | ICD-10-CM

## 2011-08-14 ENCOUNTER — Encounter: Payer: Medicare Other | Admitting: *Deleted

## 2011-08-20 ENCOUNTER — Telehealth: Payer: Self-pay | Admitting: Cardiology

## 2011-08-20 ENCOUNTER — Encounter: Payer: Self-pay | Admitting: Cardiology

## 2011-08-20 ENCOUNTER — Ambulatory Visit (INDEPENDENT_AMBULATORY_CARE_PROVIDER_SITE_OTHER): Payer: Medicare Other | Admitting: Cardiology

## 2011-08-20 VITALS — BP 147/91 | HR 87 | Ht <= 58 in | Wt 158.0 lb

## 2011-08-20 DIAGNOSIS — I2510 Atherosclerotic heart disease of native coronary artery without angina pectoris: Secondary | ICD-10-CM

## 2011-08-20 DIAGNOSIS — I779 Disorder of arteries and arterioles, unspecified: Secondary | ICD-10-CM

## 2011-08-20 DIAGNOSIS — Z720 Tobacco use: Secondary | ICD-10-CM

## 2011-08-20 DIAGNOSIS — F17200 Nicotine dependence, unspecified, uncomplicated: Secondary | ICD-10-CM

## 2011-08-20 DIAGNOSIS — I10 Essential (primary) hypertension: Secondary | ICD-10-CM

## 2011-08-20 MED ORDER — TELMISARTAN 40 MG PO TABS: 40.0000 mg | ORAL_TABLET | Freq: Every day | ORAL | Status: DC

## 2011-08-20 NOTE — Assessment & Plan Note (Signed)
She has moderate bilateral carotid arterial disease. This is stable. We will continue with risk factor modification and repeat in 6 months.

## 2011-08-20 NOTE — Assessment & Plan Note (Signed)
She is having infrequent anginal symptoms. Her last ischemic evaluation was June of 2010. We will schedule her for a lexiscan Myoview study. She will continue on her antianginal therapy including beta blocker, nitrates, and Ranexa. She is also on aspirin and Plavix.

## 2011-08-20 NOTE — Telephone Encounter (Signed)
Pt seen in office. Mylo Red RN

## 2011-08-20 NOTE — Assessment & Plan Note (Signed)
Have stressed the importance of smoking cessation. She states she would like to quit. She had poor results with Chantix and Wellbutrin in the past. She is going to try to quit with nicotine replacement products.

## 2011-08-20 NOTE — Patient Instructions (Signed)
Try and stop smoking. Use nicotine replacement products as needed.  We will resume your micardis.  I will schedule you for a nuclear stress test.  We will repeat your carotid ultrasound in 6 months.  I will see you again in 6 months.

## 2011-08-20 NOTE — Telephone Encounter (Signed)
Pt returning your call

## 2011-08-20 NOTE — Assessment & Plan Note (Signed)
Blood pressure is mildly elevated today. I have renewed her prescription for Micardis.

## 2011-08-20 NOTE — Telephone Encounter (Signed)
lmtcb Debbie Terik Haughey RN  

## 2011-08-20 NOTE — Progress Notes (Signed)
Diane [name] Date of Birth: Oct 03, 1955 Medical Record #[id number]  History of Present Illness: Diane [name] is seen today for yearly followup. She has a history of coronary disease and is status post CABG in March of 2007. She's also had stenting of the mid left circumflex coronary and the first obtuse marginal vessel. She has multiple cardiac risk factors including diabetes, dyslipidemia, hypertension, and tobacco abuse. She reports that in January she was experiencing dizzy spells. At this time her blood pressure would shoot up. She reports she was seen by neurology and one of her medications was increased. She states that there are times when her brain just seems to check out and she forgets about things such as her medical appointments. She let her Medicaid lapse. She did have a CT of her head in February which was okay. Her most recent lab work in September showed elevated glucose but was otherwise okay. She occasionally notes some chest pain that radiates into her neck. This occurs once or twice a week and is relieved with rest or nitroglycerin spray. She is still smoking 2 packs a day. She has not had her micardis prescription filled.  Current Outpatient Prescriptions on File Prior to Visit  Medication Sig Dispense Refill  . albuterol (PROVENTIL) (2.5 MG/3ML) 0.083% nebulizer solution Take 2.5 mg by nebulization as directed.        [name] albuterol (PROVENTIL,VENTOLIN) 90 MCG/ACT inhaler Inhale 2 puffs into the lungs as needed.        [name] aspirin 325 MG tablet Take 1 tablet (325 mg total) by mouth daily.  90 tablet  3  . buPROPion (WELLBUTRIN XL) 300 MG 24 hr tablet Take 300 mg by mouth daily.        . busPIRone (BUSPAR) 15 MG tablet Take 15 mg by mouth 2 (two) times daily.        . carvedilol (COREG) 12.5 MG tablet Take one tablet twice daily  180 tablet  3  . clopidogrel (PLAVIX) 75 MG tablet TAKE ONE TABLET BY MOUTH EVERY DAY  90 tablet  0  . cyclobenzaprine (FLEXERIL) 10 MG tablet Take 10 mg by  mouth as needed.        [name] dexlansoprazole (DEXILANT) 60 MG capsule Take 60 mg by mouth daily.        [name] docusate sodium (COLACE) 100 MG capsule Take 1 capsule (100 mg total) by mouth 2 (two) times daily.  180 capsule  3  . fluticasone (FLONASE) 50 MCG/ACT nasal spray Place 2 sprays into the nose daily.        [name] glimepiride (AMARYL) 4 MG tablet 2 tablets by mouth in AM and 1 tablet by mouth in PM.       . guaiFENesin (MUCINEX) 600 MG 12 hr tablet Take 2 tablets (1,200 mg total) by mouth 2 (two) times daily.  360 tablet  3  . hydrochlorothiazide (HYDRODIURIL) 50 MG tablet TAKE ONE TABLET BY MOUTH EVERY DAY  90 tablet  1  . isosorbide mononitrate (IMDUR) 60 MG 24 hr tablet Take 1 tablet (60 mg total) by mouth 2 (two) times daily.  180 tablet  3  . nitroGLYCERIN (NITROSTAT) 0.4 MG SL tablet Place 1 tablet (0.4 mg total) under the tongue every 5 (five) minutes as needed.  25 tablet  11  . Omega-3 Fatty Acids ([employer] NATURAL FISH OIL) 1200 MG CAPS Take 1 capsule by mouth 2 (two) times daily.  180 capsule  3  . OMEPRAZOLE PO Take 1 capsule  by mouth 2 (two) times daily.        . potassium chloride (KLOR-CON) 10 MEQ CR tablet Take 1 tablet (10 mEq total) by mouth 2 (two) times daily.  180 tablet  3  . ranolazine (RANEXA) 500 MG 12 hr tablet Take 1 tablet (500 mg total) by mouth 2 (two) times daily.  180 tablet  3  . rosuvastatin (CRESTOR) 10 MG tablet Take 1 tablet (10 mg total) by mouth daily.  90 tablet  3  . sitaGLIPtan-metformin (JANUMET) 50-1000 MG per tablet Take 1 tablet by mouth 2 (two) times daily with a meal.        . tiotropium (SPIRIVA) 18 MCG inhalation capsule Place 18 mcg into inhaler and inhale daily.        . traMADol (ULTRAM) 50 MG tablet Take 50 mg by mouth 2 (two) times daily.        [name] DISCONTD: telmisartan (MICARDIS) 40 MG tablet Take 40 mg by mouth daily.         Allergies  Allergen Reactions  . Ace Inhibitors     REACTION: cough  . Eggs Or Egg-Derived Products     REACTION:  reaction not know, no flu shots  . Tetracycline     REACTION: reaction not known    Past Medical History  Diagnosis Date  . Coronary artery disease     post CABG in 3/07   . Hypertension   . Hyperlipidemia   . Dyslipidemia   . Diabetes mellitus     type 2  . COPD (chronic obstructive pulmonary disease)   . CVA (cerebral infarction) 1993  . Tobacco abuse     Past Surgical History  Procedure Date  . Coronary stent placement 08/11/06    PCI of her ciurcumflex/OM vessel  . Cardiac catheterization 11/29/05    EF of 55%  . Cardiac catheterization 08/06/06    EF of 45-50%  . Coronary artery bypass graft 12/04/2005    x5 -- left internal mammary artery to the LAD, left radial artery to the ramus intermedius, saphenous vein graft to the obtuse marginal 1, sequential saphenous vein grat to the acute marginal and posterior descending, endoscopic vein harvesting from the left thigh with open vein harvest from right leg    History  Smoking status  . Current Some Day Smoker -- 2.0 packs/day  . Types: Cigarettes  Smokeless tobacco  . Never Used    History  Alcohol Use: Not on file    Family History  Problem Relation Age of Onset  . Coronary artery disease    . Heart disease      Review of Systems: As noted in history of present illness.  All other systems were reviewed and are negative.  Physical Exam: BP 147/91  Pulse 87  Ht 4\' 10"  (1.473 m)  Wt 158 lb (71.668 kg)  BMI 33.02 kg/m2 She is a chronically ill-appearing white female in no acute distress. She is normocephalic, atraumatic. Pupils are equal round and reactive. Sclera clear. Oropharynx is clear. Neck reveals bilateral carotid bruits. There is no JVD. Lungs are clear. Cardiac exam reveals a grade 1-2/6 systolic murmur at the right upper sternal border. Abdomen is soft nontender without masses or bruits. She has no edema. Pedal pulses are good. LABORATORY DATA: ECG today demonstrates normal sinus rhythm with evidence of  old anterior infarction. There are no acute changes. Recent carotid Doppler studies demonstrated 50-79% stenosis bilaterally. This is unchanged from a year ago.  Assessment /  Plan:

## 2011-08-24 ENCOUNTER — Other Ambulatory Visit: Payer: Self-pay | Admitting: Gastroenterology

## 2011-08-27 NOTE — Telephone Encounter (Signed)
NEEDS OFFICE VISIT FOR ANY FURTHER REFILLS! 

## 2011-08-29 ENCOUNTER — Ambulatory Visit (HOSPITAL_COMMUNITY): Payer: Medicare Other | Attending: Cardiology | Admitting: Radiology

## 2011-08-29 VITALS — Ht <= 58 in | Wt 158.0 lb

## 2011-08-29 DIAGNOSIS — E119 Type 2 diabetes mellitus without complications: Secondary | ICD-10-CM | POA: Insufficient documentation

## 2011-08-29 DIAGNOSIS — I2510 Atherosclerotic heart disease of native coronary artery without angina pectoris: Secondary | ICD-10-CM | POA: Insufficient documentation

## 2011-08-29 DIAGNOSIS — Z8249 Family history of ischemic heart disease and other diseases of the circulatory system: Secondary | ICD-10-CM | POA: Insufficient documentation

## 2011-08-29 DIAGNOSIS — J449 Chronic obstructive pulmonary disease, unspecified: Secondary | ICD-10-CM | POA: Insufficient documentation

## 2011-08-29 DIAGNOSIS — Z8679 Personal history of other diseases of the circulatory system: Secondary | ICD-10-CM | POA: Insufficient documentation

## 2011-08-29 DIAGNOSIS — Z951 Presence of aortocoronary bypass graft: Secondary | ICD-10-CM | POA: Insufficient documentation

## 2011-08-29 DIAGNOSIS — E785 Hyperlipidemia, unspecified: Secondary | ICD-10-CM | POA: Insufficient documentation

## 2011-08-29 DIAGNOSIS — R0602 Shortness of breath: Secondary | ICD-10-CM | POA: Insufficient documentation

## 2011-08-29 DIAGNOSIS — Z9861 Coronary angioplasty status: Secondary | ICD-10-CM | POA: Insufficient documentation

## 2011-08-29 DIAGNOSIS — I779 Disorder of arteries and arterioles, unspecified: Secondary | ICD-10-CM | POA: Insufficient documentation

## 2011-08-29 DIAGNOSIS — I25810 Atherosclerosis of coronary artery bypass graft(s) without angina pectoris: Secondary | ICD-10-CM

## 2011-08-29 DIAGNOSIS — I10 Essential (primary) hypertension: Secondary | ICD-10-CM | POA: Insufficient documentation

## 2011-08-29 DIAGNOSIS — R0789 Other chest pain: Secondary | ICD-10-CM | POA: Insufficient documentation

## 2011-08-29 DIAGNOSIS — R079 Chest pain, unspecified: Secondary | ICD-10-CM

## 2011-08-29 DIAGNOSIS — F17200 Nicotine dependence, unspecified, uncomplicated: Secondary | ICD-10-CM | POA: Insufficient documentation

## 2011-08-29 DIAGNOSIS — J4489 Other specified chronic obstructive pulmonary disease: Secondary | ICD-10-CM | POA: Insufficient documentation

## 2011-08-29 DIAGNOSIS — E669 Obesity, unspecified: Secondary | ICD-10-CM | POA: Insufficient documentation

## 2011-08-29 DIAGNOSIS — R0609 Other forms of dyspnea: Secondary | ICD-10-CM | POA: Insufficient documentation

## 2011-08-29 DIAGNOSIS — I4949 Other premature depolarization: Secondary | ICD-10-CM

## 2011-08-29 DIAGNOSIS — R0989 Other specified symptoms and signs involving the circulatory and respiratory systems: Secondary | ICD-10-CM | POA: Insufficient documentation

## 2011-08-29 DIAGNOSIS — R61 Generalized hyperhidrosis: Secondary | ICD-10-CM | POA: Insufficient documentation

## 2011-08-29 MED ORDER — REGADENOSON 0.4 MG/5ML IV SOLN
0.4000 mg | Freq: Once | INTRAVENOUS | Status: AC
  Administered 2011-08-29: 0.4 mg via INTRAVENOUS

## 2011-08-29 MED ORDER — TECHNETIUM TC 99M TETROFOSMIN IV KIT
11.0000 | PACK | Freq: Once | INTRAVENOUS | Status: AC | PRN
  Administered 2011-08-29: 11 via INTRAVENOUS

## 2011-08-29 MED ORDER — TECHNETIUM TC 99M TETROFOSMIN IV KIT
33.0000 | PACK | Freq: Once | INTRAVENOUS | Status: AC | PRN
  Administered 2011-08-29: 33 via INTRAVENOUS

## 2011-08-29 NOTE — Progress Notes (Signed)
[facility] SITE 3 NUCLEAR MED [address] [place] [address] [phone]  Cardiology Nuclear Med Study  Diane [name] is a 55 y.o. female [id number] 1956-01-16   Nuclear Med Background Indication for Stress Test:  Evaluation for Ischemia and PTCA/Stent/Graft Patency History:  COPD and 3/07 Echo:EF=55-60%; 3/07 MI>CABG; 11/07 PTCA/Stent-CFX/OM-1; '10 MPS:Old mid/distal and periapical infarct, EF=61% Cardiac Risk Factors: Carotid Disease, CVA, Family History - CAD, Hypertension, Lipids, NIDDM, Obesity and Smoker  Symptoms:  Chest Pressure with and without Exertion, relieved with NTG (last episode of chest discomfort was yesterday), Diaphoresis, DOE/SOB   Nuclear Pre-Procedure Caffeine/Decaff Intake:  None NPO After: 11:00pm   Lungs:  Clear.  O2 SAT 96% on RA. IV 0.9% NS with Angio Cath:  20g  IV Site: R Antecubital  IV Started by:  Bonnita Levan, RN  Chest Size (in):  42 Cup Size: B  Height: 4\' 10"  (1.473 m)  Weight:  158 lb (71.668 kg)  BMI:  Body mass index is 33.02 kg/(m^2). Tech Comments:  Patient held Diabetic Meds and Coreg this AM    Nuclear Med Study 1 or 2 day study: 1 day  Stress Test Type:  Lexiscan  Reading MD: Arvilla Meres, MD  Order Authorizing Provider:  Peter [place], MD  Resting Radionuclide: Technetium 63m Tetrofosmin  Resting Radionuclide Dose: 11.0 mCi   Stress Radionuclide:  Technetium 74m Tetrofosmin  Stress Radionuclide Dose: 33.0 mCi           Stress Protocol Rest HR: 75 Stress HR: 95  Rest BP: 134/82 Stress BP: 134/79  Exercise Time (min): n/a METS: n/a   Predicted Max HR: 163 bpm % Max HR: 58.28 bpm Rate Pressure Product: 74259   Dose of Adenosine (mg):  n/a Dose of Lexiscan: 0.4 mg  Dose of Atropine (mg): n/a Dose of Dobutamine: n/a mcg/kg/min (at max HR)  Stress Test Technologist: Smiley Houseman, CMA-N  Nuclear Technologist:  Doyne Keel, CNMT     Rest Procedure:  Myocardial perfusion imaging was performed at  rest 45 minutes following the intravenous administration of Technetium 57m Tetrofosmin.  Rest ECG: Prior AWMI with occasional PVC's.  Stress Procedure:  The patient received IV Lexiscan 0.4 mg over 15-seconds.  Technetium 19m Tetrofosmin injected at 30-seconds.  There were no significant changes with Lexiscan.  Quantitative spect images were obtained after a 45 minute delay.  Stress ECG: No significant change from baseline ECG  QPS Raw Data Images:  Normal; no motion artifact; normal heart/lung ratio.Mild gut uptake overlying inferior wall.  Stress Images:  Normal homogeneous uptake in all areas of the myocardium. Rest Images:  Normal homogeneous uptake in all areas of the myocardium. Subtraction (SDS):  Normal Transient Ischemic Dilatation (Normal <1.22):  1.13 Lung/Heart Ratio (Normal <0.45):  0.38  Quantitative Gated Spect Images QGS EDV:  117 ml QGS ESV:  63 ml QGS cine images:  LVEF low normal range. Possible mild hypokinesis of the inferolateral wall. QGS EF: 46%  Impression Exercise Capacity:  Lexiscan with no exercise. BP Response:  n/a Clinical Symptoms:  n/a ECG Impression:  No significant ECG changes with Lexiscan. Comparison with Prior Nuclear Study: No images to compare  Overall Impression:  Low risk stress nuclear study. EF in low normal range with very mild inferolateral HK. Mild gut uptake overlying inferior wall but perfusion appears normal. Consider 2-D echo to re-evaluate EF.  Daniel Bensimhon

## 2011-09-03 ENCOUNTER — Telehealth: Payer: Self-pay | Admitting: *Deleted

## 2011-09-03 DIAGNOSIS — I2510 Atherosclerotic heart disease of native coronary artery without angina pectoris: Secondary | ICD-10-CM

## 2011-09-03 NOTE — Telephone Encounter (Signed)
Notified of stress test results. Will schedule Echo to assess LV function. Will send to Dr. Milinda Antis.

## 2011-09-03 NOTE — Telephone Encounter (Signed)
Message copied by Lorayne Bender on Mon Sep 03, 2011  6:06 PM ------      Message from: [place], PETER M      Created: Thu Aug 30, 2011  7:32 PM       Normal perfusion study. EF is mildly depressed. This is consistent with Cath in 2007 but Echo at that time showed normal EF. Attenuation may be a factor. Recommend Echo to assess LV function.      Theron Arista [place]

## 2011-09-04 LAB — HM DIABETES EYE EXAM: HM Diabetic Eye Exam: NORMAL

## 2011-09-06 ENCOUNTER — Ambulatory Visit (HOSPITAL_COMMUNITY): Payer: Medicare Other | Attending: Internal Medicine | Admitting: Radiology

## 2011-09-06 DIAGNOSIS — E119 Type 2 diabetes mellitus without complications: Secondary | ICD-10-CM | POA: Insufficient documentation

## 2011-09-06 DIAGNOSIS — I2510 Atherosclerotic heart disease of native coronary artery without angina pectoris: Secondary | ICD-10-CM | POA: Insufficient documentation

## 2011-09-06 DIAGNOSIS — I379 Nonrheumatic pulmonary valve disorder, unspecified: Secondary | ICD-10-CM | POA: Insufficient documentation

## 2011-09-06 DIAGNOSIS — I059 Rheumatic mitral valve disease, unspecified: Secondary | ICD-10-CM | POA: Insufficient documentation

## 2011-09-06 DIAGNOSIS — I10 Essential (primary) hypertension: Secondary | ICD-10-CM | POA: Insufficient documentation

## 2011-09-06 DIAGNOSIS — E785 Hyperlipidemia, unspecified: Secondary | ICD-10-CM | POA: Insufficient documentation

## 2011-09-06 DIAGNOSIS — I359 Nonrheumatic aortic valve disorder, unspecified: Secondary | ICD-10-CM | POA: Insufficient documentation

## 2011-09-06 DIAGNOSIS — F17200 Nicotine dependence, unspecified, uncomplicated: Secondary | ICD-10-CM | POA: Insufficient documentation

## 2011-09-07 ENCOUNTER — Telehealth: Payer: Self-pay | Admitting: Cardiology

## 2011-09-07 NOTE — Telephone Encounter (Signed)
Fu call °Pt returning your call  °

## 2011-09-07 NOTE — Progress Notes (Signed)
lmtcb

## 2011-09-10 ENCOUNTER — Telehealth: Payer: Self-pay | Admitting: *Deleted

## 2011-09-10 NOTE — Telephone Encounter (Signed)
Lm w/echo results. Will send to Dr. Milinda Antis.

## 2011-09-10 NOTE — Telephone Encounter (Signed)
Message copied by Lorayne Bender on Mon Sep 10, 2011  5:55 PM ------      Message from: [place], PETER M      Created: Fri Sep 07, 2011  8:29 AM       Echo shows normal EF as oppose to nuclear study which was mildly reduced. Some diastolic dysfunction. Mild aortic stenosis and mild MR.      Peter [place] MD

## 2011-09-12 ENCOUNTER — Other Ambulatory Visit: Payer: Self-pay | Admitting: Pulmonary Disease

## 2011-09-24 ENCOUNTER — Other Ambulatory Visit: Payer: Self-pay | Admitting: Family Medicine

## 2011-09-24 ENCOUNTER — Other Ambulatory Visit: Payer: Self-pay | Admitting: Gastroenterology

## 2011-09-24 NOTE — Telephone Encounter (Signed)
[facility] pharmacy request refill Plavix 75 mg . #30 x 11 Pt last seen 06/13/11.

## 2011-09-26 NOTE — Telephone Encounter (Signed)
NEEDS OFFICE VISIT FOR ANY FURTHER REFILLS! 

## 2011-10-23 ENCOUNTER — Other Ambulatory Visit: Payer: Self-pay | Admitting: Gastroenterology

## 2011-10-24 NOTE — Telephone Encounter (Signed)
NEEDS OFFICE VISIT.

## 2011-11-15 ENCOUNTER — Encounter: Payer: Self-pay | Admitting: Family Medicine

## 2011-11-15 ENCOUNTER — Telehealth: Payer: Self-pay

## 2011-11-15 MED ORDER — LOSARTAN POTASSIUM 100 MG PO TABS: 100.0000 mg | ORAL_TABLET | Freq: Every day | ORAL | Status: DC

## 2011-11-15 NOTE — Telephone Encounter (Signed)
Patient called was told Dr.Jordan received a fax notice from [employer],stating will no longer cover micardis.Dr.Jordan prescribed losartan 100 mg daily.

## 2011-11-21 ENCOUNTER — Other Ambulatory Visit: Payer: Self-pay | Admitting: Gastroenterology

## 2011-11-29 DIAGNOSIS — M6530 Trigger finger, unspecified finger: Secondary | ICD-10-CM | POA: Diagnosis not present

## 2011-11-29 DIAGNOSIS — Z79899 Other long term (current) drug therapy: Secondary | ICD-10-CM | POA: Diagnosis not present

## 2011-11-29 DIAGNOSIS — IMO0001 Reserved for inherently not codable concepts without codable children: Secondary | ICD-10-CM | POA: Diagnosis not present

## 2011-11-29 DIAGNOSIS — M76899 Other specified enthesopathies of unspecified lower limb, excluding foot: Secondary | ICD-10-CM | POA: Diagnosis not present

## 2011-11-29 DIAGNOSIS — R5383 Other fatigue: Secondary | ICD-10-CM | POA: Diagnosis not present

## 2011-11-29 DIAGNOSIS — M1710 Unilateral primary osteoarthritis, unspecified knee: Secondary | ICD-10-CM | POA: Diagnosis not present

## 2011-11-29 DIAGNOSIS — R5381 Other malaise: Secondary | ICD-10-CM | POA: Diagnosis not present

## 2011-12-11 ENCOUNTER — Ambulatory Visit (INDEPENDENT_AMBULATORY_CARE_PROVIDER_SITE_OTHER): Payer: Medicare Other | Admitting: Family Medicine

## 2011-12-11 ENCOUNTER — Encounter: Payer: Self-pay | Admitting: Family Medicine

## 2011-12-11 VITALS — BP 128/70 | HR 80 | Temp 97.3°F | Ht <= 58 in | Wt 159.2 lb

## 2011-12-11 DIAGNOSIS — E785 Hyperlipidemia, unspecified: Secondary | ICD-10-CM | POA: Diagnosis not present

## 2011-12-11 DIAGNOSIS — F17200 Nicotine dependence, unspecified, uncomplicated: Secondary | ICD-10-CM

## 2011-12-11 DIAGNOSIS — E871 Hypo-osmolality and hyponatremia: Secondary | ICD-10-CM | POA: Diagnosis not present

## 2011-12-11 DIAGNOSIS — I10 Essential (primary) hypertension: Secondary | ICD-10-CM

## 2011-12-11 LAB — COMPREHENSIVE METABOLIC PANEL
ALT: 26 U/L (ref 0–35)
AST: 17 U/L (ref 0–37)
Albumin: 3.8 g/dL (ref 3.5–5.2)
Alkaline Phosphatase: 70 U/L (ref 39–117)
BUN: 12 mg/dL (ref 6–23)
CO2: 29 mEq/L (ref 19–32)
Calcium: 9.2 mg/dL (ref 8.4–10.5)
Chloride: 89 mEq/L — ABNORMAL LOW (ref 96–112)
Creatinine, Ser: 0.8 mg/dL (ref 0.4–1.2)
GFR: 84.43 mL/min (ref >60.00)
Glucose, Bld: 132 mg/dL — ABNORMAL HIGH (ref 70–99)
Potassium: 4.3 mEq/L (ref 3.5–5.1)
Sodium: 128 mEq/L — ABNORMAL LOW (ref 135–145)
Total Bilirubin: 0.1 mg/dL — ABNORMAL LOW (ref 0.3–1.2)
Total Protein: 7 g/dL (ref 6.0–8.3)

## 2011-12-11 LAB — LIPID PANEL
Cholesterol: 147 mg/dL (ref 0–200)
HDL: 45.5 mg/dL (ref >39.00)
Total CHOL/HDL Ratio: 3
Triglycerides: 237 mg/dL — ABNORMAL HIGH (ref 0.0–149.0)
VLDL: 47.4 mg/dL — ABNORMAL HIGH (ref 0.0–40.0)

## 2011-12-11 LAB — LDL CHOLESTEROL, DIRECT: Direct LDL: 77.4 mg/dL

## 2011-12-11 NOTE — Assessment & Plan Note (Signed)
bp in fair control at this time  No changes needed  Disc lifstyle change with low sodium diet and exercise   Comp met today

## 2011-12-11 NOTE — Patient Instructions (Signed)
Keep working on diabetic diet / weight loss / and exercise as tolerated No ice cream  Lab today for cholesterol and sodium level bp is good Keep thinking about quitting smoking  Schedule annual exam in 6 months with labs prior

## 2011-12-11 NOTE — Assessment & Plan Note (Signed)
Disc in detail risks of smoking and possible outcomes including copd, vascular/ heart disease, cancer , respiratory and sinus infections  Pt voices understanding Pt is not ready to quit smoking

## 2011-12-11 NOTE — Assessment & Plan Note (Signed)
Na was 131 at rheum visit - re check that today Is on diuretic

## 2011-12-11 NOTE — Progress Notes (Signed)
Subjective:    Patient ID: Diane [name], female    DOB: 1956-03-21, 56 y.o.   MRN: [id number]  HPI Here for f/u of HTN and lipids and tab abuse Is feeling ok overall   Never really found out about dizziness -results from neurol  Since then Dr Swaziland did stress test and carotid studies- reasssuring   Obesity Wt is stable with bmi of 33 Was 5 lb less- gained some  Eating is ok overal - sticks with DM diet except for ice cream    Sees Dr Talmage Nap for DM Doing ok per pt - ?: what her last a1c was -- has appt upcoming 130s-140 sugar  opthy 12/12  bp is 128/70     Today No cp or palpitations or headaches or edema  No side effects to medicines    Lipids- on crestor and diet  Lab Results  Component Value Date   CHOL 122 06/13/2011   HDL 39.50 06/13/2011   LDLCALC 46 06/13/2011   LDLDIRECT 116.4 04/21/2008   TRIG 181.0* 06/13/2011   CHOLHDL 3 06/13/2011   Diet - fair except for ice cream  Not very motivated to change  Na was low with Dr Kandra Nicolas labs  No muscle pain or other symptoms Does not drink excessive amt of water    No progress with smoking Not ready to quit  Was sick before x mas -- still a little sob since then  Was wheezing on and off -that is better now  Patient Active Problem List  Diagnoses  . DIABETES MELLITUS, TYPE II  . HYPERLIPIDEMIA  . ANXIETY  . TOBACCO USE  . DEPRESSION  . OBSTRUCTIVE SLEEP APNEA  . MIGRAINE HEADACHE  . CARPAL TUNNEL SYNDROME, BILATERAL  . HYPERTENSION  . CORONARY ARTERY DISEASE  . CVA  . ALLERGIC RHINITIS  . GERD  . FIBROMYALGIA  . DIZZINESS  . OTHER DYSPHAGIA  . Carotid artery disease  . Hyponatremia   Past Medical History  Diagnosis Date  . Coronary artery disease     post CABG in 3/07   . Hypertension   . Hyperlipidemia   . Dyslipidemia   . Diabetes mellitus     type 2  . COPD (chronic obstructive pulmonary disease)   . CVA (cerebral infarction) 1993  . Tobacco abuse    Past Surgical History  Procedure  Date  . Coronary stent placement 08/11/06    PCI of her ciurcumflex/OM vessel  . Cardiac catheterization 11/29/05    EF of 55%  . Cardiac catheterization 08/06/06    EF of 45-50%  . Coronary artery bypass graft 12/04/2005    x5 -- left internal mammary artery to the LAD, left radial artery to the ramus intermedius, saphenous vein graft to the obtuse marginal 1, sequential saphenous vein grat to the acute marginal and posterior descending, endoscopic vein harvesting from the left thigh with open vein harvest from right leg   History  Substance Use Topics  . Smoking status: Current Some Day Smoker -- 2.0 packs/day    Types: Cigarettes  . Smokeless tobacco: Never Used  . Alcohol Use: Not on file   Family History  Problem Relation Age of Onset  . Coronary artery disease    . Heart disease     Allergies  Allergen Reactions  . Ace Inhibitors     REACTION: cough  . Eggs Or Egg-Derived Products     REACTION: reaction not know, no flu shots  . Tetracycline  REACTION: reaction not known   Current Outpatient Prescriptions on File Prior to Visit  Medication Sig Dispense Refill  . albuterol (PROVENTIL) (2.5 MG/3ML) 0.083% nebulizer solution Take 2.5 mg by nebulization as directed.        [name] albuterol (PROVENTIL,VENTOLIN) 90 MCG/ACT inhaler Inhale 2 puffs into the lungs as needed.        [name] aspirin 325 MG tablet Take 1 tablet (325 mg total) by mouth daily.  90 tablet  3  . carvedilol (COREG) 12.5 MG tablet Take one tablet twice daily  180 tablet  3  . clopidogrel (PLAVIX) 75 MG tablet TAKE ONE TABLET BY MOUTH EVERY DAY  90 tablet  3  . cyclobenzaprine (FLEXERIL) 10 MG tablet Take 10 mg by mouth as needed.        [name] DEXILANT 60 MG capsule TAKE 1 CAPSULE DAILY  30 capsule  0  . docusate sodium (COLACE) 100 MG capsule Take 1 capsule (100 mg total) by mouth 2 (two) times daily.  180 capsule  3  . glimepiride (AMARYL) 4 MG tablet 2 tablets by mouth in AM and 1 tablet by mouth in PM.       .  guaiFENesin (MUCINEX) 600 MG 12 hr tablet Take 2 tablets (1,200 mg total) by mouth 2 (two) times daily.  360 tablet  3  . hydrochlorothiazide (HYDRODIURIL) 50 MG tablet TAKE ONE TABLET BY MOUTH EVERY DAY  90 tablet  1  . isosorbide mononitrate (IMDUR) 60 MG 24 hr tablet Take 1 tablet (60 mg total) by mouth 2 (two) times daily.  180 tablet  3  . losartan (COZAAR) 100 MG tablet Take 1 tablet (100 mg total) by mouth daily.  90 tablet  3  . Omega-3 Fatty Acids ([employer] NATURAL FISH OIL) 1200 MG CAPS Take 1 capsule by mouth 2 (two) times daily.  180 capsule  3  . potassium chloride (KLOR-CON) 10 MEQ CR tablet Take 1 tablet (10 mEq total) by mouth 2 (two) times daily.  180 tablet  3  . ranolazine (RANEXA) 500 MG 12 hr tablet Take 1 tablet (500 mg total) by mouth 2 (two) times daily.  180 tablet  3  . rosuvastatin (CRESTOR) 10 MG tablet Take 1 tablet (10 mg total) by mouth daily.  90 tablet  3  . sitaGLIPtan-metformin (JANUMET) 50-1000 MG per tablet Take 1 tablet by mouth 2 (two) times daily with a meal.        . tiotropium (SPIRIVA) 18 MCG inhalation capsule Place 18 mcg into inhaler and inhale daily.        . traMADol (ULTRAM) 50 MG tablet Take 50 mg by mouth 2 (two) times daily.        [name] buPROPion (WELLBUTRIN XL) 300 MG 24 hr tablet Take 300 mg by mouth daily.        . busPIRone (BUSPAR) 15 MG tablet Take 15 mg by mouth 2 (two) times daily.        . fluticasone (FLONASE) 50 MCG/ACT nasal spray Place 2 sprays into the nose daily.        . nitroGLYCERIN (NITROSTAT) 0.4 MG SL tablet Place 1 tablet (0.4 mg total) under the tongue every 5 (five) minutes as needed.  25 tablet  11  . OMEPRAZOLE PO Take 1 capsule by mouth 2 (two) times daily.            Review of Systems Review of Systems  Constitutional: Negative for fever, appetite change,  and unexpected weight  change.pos for fatigue   Eyes: Negative for pain and visual disturbance.  Respiratory: Negative for cough and pos for chonic sob      Cardiovascular: Negative for cp or palpitations    Gastrointestinal: Negative for nausea, diarrhea and constipation.  Genitourinary: Negative for urgency and frequency.  Skin: Negative for pallor or rash   MSK pos for chronic back pain that limits her activity, neg for joint swelling Neurological: Negative for weakness, light-headedness, numbness and headaches.  Hematological: Negative for adenopathy. Does not bruise/bleed easily.  Psychiatric/Behavioral: Negative for dysphoric mood. The patient is not nervous/anxious today       Objective:   Physical Exam  Constitutional: She appears well-developed and well-nourished. No distress.  HENT:  Head: Normocephalic and atraumatic.  Mouth/Throat: Oropharynx is clear and moist.  Eyes: Conjunctivae and EOM are normal. Pupils are equal, round, and reactive to light. No scleral icterus.  Neck: Normal range of motion. Neck supple. No JVD present. Carotid bruit is not present. No thyromegaly present.  Cardiovascular: Normal rate, regular rhythm and normal heart sounds.  Exam reveals no gallop.   Pulmonary/Chest: Effort normal and breath sounds normal. No respiratory distress. She has no wheezes.       Diffusely distant bs Very harsh bs with fair air exch  Few crackles at bases bilat   Abdominal: Soft. Bowel sounds are normal. She exhibits no distension, no abdominal bruit and no mass. There is no tenderness.  Musculoskeletal: Normal range of motion. She exhibits no edema.  Lymphadenopathy:    She has no cervical adenopathy.  Neurological: She is alert. She has normal reflexes. No cranial nerve deficit. She exhibits normal muscle tone. Coordination normal.  Skin: Skin is warm and dry. No rash noted. No erythema. No pallor.  Psychiatric: She has a normal mood and affect.       Nl affect - and pt seems rather nonchalant about her health- lacks motivation for change           Assessment & Plan:

## 2011-12-11 NOTE — Assessment & Plan Note (Signed)
Lab today Disc goals for lipids and reasons to control them Rev labs with pt from fall draw Rev low sat fat diet in detail - needs to quit ice cream and fatty foods

## 2011-12-21 ENCOUNTER — Other Ambulatory Visit: Payer: Self-pay | Admitting: Family Medicine

## 2011-12-21 ENCOUNTER — Other Ambulatory Visit: Payer: Self-pay | Admitting: Gastroenterology

## 2011-12-24 ENCOUNTER — Other Ambulatory Visit: Payer: Self-pay

## 2011-12-24 NOTE — Telephone Encounter (Signed)
Opened in error

## 2011-12-24 NOTE — Telephone Encounter (Signed)
[facility] in [facility] request refill HCTZ 50 mg #90 x 3.

## 2012-01-08 ENCOUNTER — Other Ambulatory Visit (INDEPENDENT_AMBULATORY_CARE_PROVIDER_SITE_OTHER): Payer: Medicare Other

## 2012-01-08 DIAGNOSIS — E871 Hypo-osmolality and hyponatremia: Secondary | ICD-10-CM

## 2012-01-08 LAB — SODIUM: Sodium: 139 mEq/L (ref 135–145)

## 2012-01-09 ENCOUNTER — Encounter: Payer: Self-pay | Admitting: *Deleted

## 2012-02-12 ENCOUNTER — Other Ambulatory Visit: Payer: Self-pay | Admitting: Cardiology

## 2012-03-06 ENCOUNTER — Other Ambulatory Visit: Payer: Self-pay | Admitting: Family Medicine

## 2012-03-06 ENCOUNTER — Other Ambulatory Visit: Payer: Self-pay | Admitting: Cardiology

## 2012-03-06 NOTE — Telephone Encounter (Signed)
Done

## 2012-03-13 ENCOUNTER — Encounter (INDEPENDENT_AMBULATORY_CARE_PROVIDER_SITE_OTHER): Payer: Medicare Other

## 2012-03-13 DIAGNOSIS — I6529 Occlusion and stenosis of unspecified carotid artery: Secondary | ICD-10-CM

## 2012-03-13 DIAGNOSIS — I779 Disorder of arteries and arterioles, unspecified: Secondary | ICD-10-CM

## 2012-03-19 ENCOUNTER — Encounter: Payer: Self-pay | Admitting: Cardiology

## 2012-03-19 ENCOUNTER — Ambulatory Visit (INDEPENDENT_AMBULATORY_CARE_PROVIDER_SITE_OTHER): Payer: Medicare Other | Admitting: Cardiology

## 2012-03-19 VITALS — BP 122/78 | HR 68 | Ht <= 58 in | Wt 160.0 lb

## 2012-03-19 DIAGNOSIS — I209 Angina pectoris, unspecified: Secondary | ICD-10-CM

## 2012-03-19 DIAGNOSIS — Z720 Tobacco use: Secondary | ICD-10-CM

## 2012-03-19 DIAGNOSIS — I2510 Atherosclerotic heart disease of native coronary artery without angina pectoris: Secondary | ICD-10-CM | POA: Diagnosis not present

## 2012-03-19 DIAGNOSIS — I10 Essential (primary) hypertension: Secondary | ICD-10-CM

## 2012-03-19 DIAGNOSIS — F17200 Nicotine dependence, unspecified, uncomplicated: Secondary | ICD-10-CM | POA: Diagnosis not present

## 2012-03-19 DIAGNOSIS — I779 Disorder of arteries and arterioles, unspecified: Secondary | ICD-10-CM | POA: Diagnosis not present

## 2012-03-19 NOTE — Assessment & Plan Note (Signed)
She has stable class II angina. She is on maximal medical therapy with aspirin, Plavix, nitrates, carvedilol, and Ranexa. I told her that if she had any worsening anginal symptoms I would recommend cardiac catheterization. She doesn't feel that her symptoms are bad enough at this time to pursue this. We will continue with her current therapy and followup again in 6 months. I did stress the importance of smoking cessation in decreasing her risk.

## 2012-03-19 NOTE — Assessment & Plan Note (Signed)
She has moderate bilateral carotid disease. We will repeat Doppler studies in 6 months.

## 2012-03-19 NOTE — Progress Notes (Signed)
Diane [name] Date of Birth: January 09, 1956 Medical Record #[id number]  History of Present Illness: Diane [name] is seen today for followup. She has a history of coronary disease and is status post CABG in March of 2007. She's also had stenting of the mid left circumflex coronary and the first obtuse marginal vessel later the same year. The vein graft to the obtuse marginal was occluded at that time. She has multiple cardiac risk factors including diabetes, dyslipidemia, hypertension, and tobacco abuse. She also has moderate bilateral carotid arterial disease. Recent Dopplers were unchanged. She still complains of symptoms of angina if she rushes too much or if she is doing activity using her arms. This is a mid substernal chest pain or pressure. It is relieved with nitroglycerin. She has no associated shortness of breath. She continues to smoke 2 packs per day. Her last cardiac evaluation with a nuclear stress test in November showed that the inferior wall was obscured by uptake of the tracer in the bowel. There is no definite ischemia. Ejection fraction was reduced. However an echocardiogram she had normal vigorous LV function. She thinks that her chest pain symptoms are slightly worse but are fairly stable. She's had no significant pain at rest.  Current Outpatient Prescriptions on File Prior to Visit  Medication Sig Dispense Refill  . albuterol (PROVENTIL) (2.5 MG/3ML) 0.083% nebulizer solution Take 2.5 mg by nebulization as directed.        [name] albuterol (PROVENTIL,VENTOLIN) 90 MCG/ACT inhaler Inhale 2 puffs into the lungs as needed.        [name] aspirin 325 MG tablet Take 1 tablet (325 mg total) by mouth daily.  90 tablet  3  . carvedilol (COREG) 12.5 MG tablet TAKE 1 TABLET BY MOUTH TWICE DAILY  180 tablet  2  . clopidogrel (PLAVIX) 75 MG tablet TAKE ONE TABLET BY MOUTH EVERY DAY  90 tablet  3  . CRESTOR 10 MG tablet TAKE 1 TABLET DAILY  90 tablet  2  . cyclobenzaprine (FLEXERIL) 10 MG tablet Take 10 mg by  mouth as needed.        [name] DEXILANT 60 MG capsule TAKE 1 CAPSULE DAILY  30 capsule  0  . docusate sodium (COLACE) 100 MG capsule Take 1 capsule (100 mg total) by mouth 2 (two) times daily.  180 capsule  3  . fluticasone (FLONASE) 50 MCG/ACT nasal spray Place 2 sprays into the nose daily.        [name] glimepiride (AMARYL) 4 MG tablet 2 tablets by mouth in AM and 1 tablet by mouth in PM.       . guaiFENesin (MUCINEX) 600 MG 12 hr tablet Take 2 tablets (1,200 mg total) by mouth 2 (two) times daily.  360 tablet  3  . hydrochlorothiazide (HYDRODIURIL) 50 MG tablet TAKE ONE TABLET BY MOUTH EVERY DAY  90 tablet  3  . isosorbide mononitrate (IMDUR) 60 MG 24 hr tablet TAKE 1 TABLET (60MG  TOTAL) BY MOUTH TWICE A DAY  180 tablet  2  . losartan (COZAAR) 100 MG tablet Take 1 tablet (100 mg total) by mouth daily.  90 tablet  3  . nitroGLYCERIN (NITROSTAT) 0.4 MG SL tablet Place 1 tablet (0.4 mg total) under the tongue every 5 (five) minutes as needed.  25 tablet  11  . Omega-3 Fatty Acids ([employer] NATURAL FISH OIL) 1200 MG CAPS Take 1 capsule by mouth 2 (two) times daily.  180 capsule  3  . OMEPRAZOLE PO Take 1 capsule by  mouth 2 (two) times daily.        . potassium chloride (K-DUR,KLOR-CON) 10 MEQ tablet TAKE 1 TABLET TWICE DAILY  180 tablet  2  . RANEXA 500 MG 12 hr tablet TAKE 1 TABLET BY MOUTH TWICE A DAY  180 tablet  2  . sitaGLIPtan-metformin (JANUMET) 50-1000 MG per tablet Take 1 tablet by mouth 2 (two) times daily with a meal.        . traMADol (ULTRAM) 50 MG tablet Take 50 mg by mouth 2 (two) times daily.          Allergies  Allergen Reactions  . Ace Inhibitors     REACTION: cough  . Eggs Or Egg-Derived Products     REACTION: reaction not know, no flu shots  . Tetracycline     REACTION: reaction not known    Past Medical History  Diagnosis Date  . Coronary artery disease     post CABG in 3/07   . Hypertension   . Hyperlipidemia   . Dyslipidemia   . Diabetes mellitus     type 2  . COPD  (chronic obstructive pulmonary disease)   . CVA (cerebral infarction) 1993  . Tobacco abuse     Past Surgical History  Procedure Date  . Coronary stent placement 08/11/06    PCI of her ciurcumflex/OM vessel  . Cardiac catheterization 11/29/05    EF of 55%  . Cardiac catheterization 08/06/06    EF of 45-50%  . Coronary artery bypass graft 12/04/2005    x5 -- left internal mammary artery to the LAD, left radial artery to the ramus intermedius, saphenous vein graft to the obtuse marginal 1, sequential saphenous vein grat to the acute marginal and posterior descending, endoscopic vein harvesting from the left thigh with open vein harvest from right leg    History  Smoking status  . Current Some Day Smoker -- 2.0 packs/day  . Types: Cigarettes  Smokeless tobacco  . Never Used    History  Alcohol Use: Not on file    Family History  Problem Relation Age of Onset  . Coronary artery disease    . Heart disease      Review of Systems: As noted in history of present illness.  All other systems were reviewed and are negative.  Physical Exam: BP 122/78  Pulse 68  Ht 4\' 10"  (1.473 m)  Wt 160 lb (72.576 kg)  BMI 33.44 kg/m2 She is a chronically ill-appearing white female in no acute distress. She is normocephalic, atraumatic. Pupils are equal round and reactive. Sclera clear. Oropharynx is clear. Neck reveals bilateral carotid bruits. There is no JVD. Lungs are clear. Cardiac exam reveals a grade 1-2/6 systolic murmur at the right upper sternal border. Abdomen is soft nontender without masses or bruits. She has no edema. Pedal pulses are good. LABORATORY DATA:  Recent carotid Doppler studies demonstrated 50-79% stenosis bilaterally. This is unchanged from prior studies.  Assessment / Plan:

## 2012-03-19 NOTE — Assessment & Plan Note (Signed)
Blood pressure has improved significantly with the addition of micardis.

## 2012-03-19 NOTE — Patient Instructions (Addendum)
Continue your current therapy.  Let me know if your symptoms of chest pain worsen.  I will see you back in 6 months  We will repeat carotid dopplers in 6 months.  Stop smoking

## 2012-03-19 NOTE — Assessment & Plan Note (Signed)
We discussed the importance of smoking cessation. She feels that she is unable to quit.

## 2012-04-02 ENCOUNTER — Ambulatory Visit (INDEPENDENT_AMBULATORY_CARE_PROVIDER_SITE_OTHER)
Admission: RE | Admit: 2012-04-02 | Discharge: 2012-04-02 | Disposition: A | Discharge status: Home / Self Care | Payer: Medicare Other | Source: Ambulatory Visit | Attending: Pulmonary Disease | Admitting: Pulmonary Disease

## 2012-04-02 ENCOUNTER — Ambulatory Visit (INDEPENDENT_AMBULATORY_CARE_PROVIDER_SITE_OTHER): Payer: Medicare Other | Admitting: Pulmonary Disease

## 2012-04-02 ENCOUNTER — Encounter: Payer: Self-pay | Admitting: Pulmonary Disease

## 2012-04-02 VITALS — BP 126/78 | HR 76 | Temp 97.4°F | Ht <= 58 in | Wt 162.6 lb

## 2012-04-02 DIAGNOSIS — F17200 Nicotine dependence, unspecified, uncomplicated: Secondary | ICD-10-CM

## 2012-04-02 DIAGNOSIS — J449 Chronic obstructive pulmonary disease, unspecified: Secondary | ICD-10-CM | POA: Diagnosis not present

## 2012-04-02 DIAGNOSIS — G4733 Obstructive sleep apnea (adult) (pediatric): Secondary | ICD-10-CM | POA: Diagnosis not present

## 2012-04-02 DIAGNOSIS — I517 Cardiomegaly: Secondary | ICD-10-CM | POA: Diagnosis not present

## 2012-04-02 DIAGNOSIS — R059 Cough, unspecified: Secondary | ICD-10-CM | POA: Diagnosis not present

## 2012-04-02 DIAGNOSIS — R0602 Shortness of breath: Secondary | ICD-10-CM | POA: Diagnosis not present

## 2012-04-02 DIAGNOSIS — J42 Unspecified chronic bronchitis: Secondary | ICD-10-CM | POA: Insufficient documentation

## 2012-04-02 DIAGNOSIS — R05 Cough: Secondary | ICD-10-CM | POA: Diagnosis not present

## 2012-04-02 MED ORDER — TIOTROPIUM BROMIDE MONOHYDRATE 18 MCG IN CAPS: 18.0000 ug | ORAL_CAPSULE | Freq: Every day | RESPIRATORY_TRACT | Status: DC

## 2012-04-02 MED ORDER — ALBUTEROL SULFATE HFA 108 (90 BASE) MCG/ACT IN AERS: 2.0000 | INHALATION_SPRAY | Freq: Four times a day (QID) | RESPIRATORY_TRACT | Status: DC | PRN

## 2012-04-02 NOTE — Assessment & Plan Note (Signed)
Had extensive conversation about smoking cessation.  Explained that retrying therapies can be beneficial even if these did not work initially.  She will consider her options.

## 2012-04-02 NOTE — Progress Notes (Signed)
Chief Complaint  Patient presents with  . Follow-up    last ov 07/2010. Pt states she has not worn her cpap x couple months--she states she keep taking the mask off in the middle of the night. States she has been having difficulty breathing, cough w/ very little grey color phlem x 6 months. denies any wheezing, chest tx    History of Present Illness: Diane [name] is a 56 y.o. female smoker with cough, rhinitis, and OSA.  I last saw her October 2011.  She is here to get her inhalers refilled.  She has albuterol, and uses this daily.  She gets daily cough with occasional gray sputum.  She denies hemoptysis.  She gets winded with minimal activity.  She does not wheeze.  She gets occasional leg swelling.  She continues to smoke 1.5 to 2 packs per day.  She has not had recent PFT or CXR.  She restarted her CPAP.  She is not sure if the pressure is correct.  She does not recall her DME.  She has not had contact with her DME for sometime.   Past Medical History  Diagnosis Date  . Coronary artery disease     post CABG in 3/07   . Hypertension   . Hyperlipidemia   . Dyslipidemia   . Diabetes mellitus     type 2  . COPD (chronic obstructive pulmonary disease)   . CVA (cerebral infarction) 1993  . Tobacco abuse     Past Surgical History  Procedure Date  . Coronary stent placement 08/11/06    PCI of her ciurcumflex/OM vessel  . Cardiac catheterization 11/29/05    EF of 55%  . Cardiac catheterization 08/06/06    EF of 45-50%  . Coronary artery bypass graft 12/04/2005    x5 -- left internal mammary artery to the LAD, left radial artery to the ramus intermedius, saphenous vein graft to the obtuse marginal 1, sequential saphenous vein grat to the acute marginal and posterior descending, endoscopic vein harvesting from the left thigh with open vein harvest from right leg    Outpatient Encounter Prescriptions as of 04/02/2012  Medication Sig Dispense Refill  . albuterol (PROVENTIL) (2.5 MG/3ML)  0.083% nebulizer solution Take 2.5 mg by nebulization every 6 (six) hours as needed.       [name] aspirin 325 MG tablet Take 1 tablet (325 mg total) by mouth daily.  90 tablet  3  . carvedilol (COREG) 12.5 MG tablet TAKE 1 TABLET BY MOUTH TWICE DAILY  180 tablet  2  . clopidogrel (PLAVIX) 75 MG tablet TAKE ONE TABLET BY MOUTH EVERY DAY  90 tablet  3  . CRESTOR 10 MG tablet TAKE 1 TABLET DAILY  90 tablet  2  . cyclobenzaprine (FLEXERIL) 10 MG tablet Take 10 mg by mouth as needed.        [name] DEXILANT 60 MG capsule TAKE 1 CAPSULE DAILY  30 capsule  0  . docusate sodium (COLACE) 100 MG capsule Take 1 capsule (100 mg total) by mouth 2 (two) times daily.  180 capsule  3  . glimepiride (AMARYL) 4 MG tablet 2 tablets by mouth in AM and 1 tablet by mouth in PM.       . guaiFENesin (MUCINEX) 600 MG 12 hr tablet Take 2 tablets (1,200 mg total) by mouth 2 (two) times daily.  360 tablet  3  . hydrochlorothiazide (HYDRODIURIL) 50 MG tablet TAKE ONE TABLET BY MOUTH EVERY DAY  90 tablet  3  .  isosorbide mononitrate (IMDUR) 60 MG 24 hr tablet TAKE 1 TABLET (60MG  TOTAL) BY MOUTH TWICE A DAY  180 tablet  2  . losartan (COZAAR) 100 MG tablet Take 1 tablet (100 mg total) by mouth daily.  90 tablet  3  . nitroGLYCERIN (NITROSTAT) 0.4 MG SL tablet Place 1 tablet (0.4 mg total) under the tongue every 5 (five) minutes as needed.  25 tablet  11  . Omega-3 Fatty Acids ([employer] NATURAL FISH OIL) 1200 MG CAPS Take 1 capsule by mouth 2 (two) times daily.  180 capsule  3  . OMEPRAZOLE PO Take 1 capsule by mouth 2 (two) times daily.       . potassium chloride (K-DUR,KLOR-CON) 10 MEQ tablet TAKE 1 TABLET TWICE DAILY  180 tablet  2  . RANEXA 500 MG 12 hr tablet TAKE 1 TABLET BY MOUTH TWICE A DAY  180 tablet  2  . sitaGLIPtan-metformin (JANUMET) 50-1000 MG per tablet Take 1 tablet by mouth 2 (two) times daily with a meal.        . traMADol (ULTRAM) 50 MG tablet Take 50 mg by mouth 2 (two) times daily.        [name] DISCONTD: albuterol  (PROVENTIL,VENTOLIN) 90 MCG/ACT inhaler Inhale 2 puffs into the lungs as needed.        [name] albuterol (PROAIR HFA) 108 (90 BASE) MCG/ACT inhaler Inhale 2 puffs into the lungs every 6 (six) hours as needed for wheezing.  1 Inhaler  3  . tiotropium (SPIRIVA) 18 MCG inhalation capsule Place 1 capsule (18 mcg total) into inhaler and inhale daily.  30 capsule  6  . DISCONTD: fluticasone (FLONASE) 50 MCG/ACT nasal spray Place 2 sprays into the nose daily.          Allergies  Allergen Reactions  . Ace Inhibitors     REACTION: cough  . Eggs Or Egg-Derived Products     REACTION: reaction not know, no flu shots  . Tetracycline     REACTION: reaction not known    Physical Exam:  Blood pressure 126/78, pulse 76, temperature 97.4 F (36.3 C), temperature source Oral, height 4\' 10"  (1.473 m), weight 162 lb 9.6 oz (73.755 kg), SpO2 97.00%.  Body mass index is 33.98 kg/(m^2).  Wt Readings from Last 2 Encounters:  04/02/12 162 lb 9.6 oz (73.755 kg)  03/19/12 160 lb (72.576 kg)    General - Obese HEENT - no sinus tenderness, no oral exudate, erythema of posterior pharynx, no LAN Cardiac - s1s2 regular, no murmur Chest - decreased breath sounds, normal respiratory excursion, no wheeze/rales Abd - soft, nontender Ext - no edema Neuro - normal strength Psych - normal mood, behavior  Lab Results  Component Value Date   WBC 8.4 07/26/2010   HGB 13.4 07/26/2010   HCT 40.0 07/26/2010   MCV 79.6 07/26/2010   PLT 307.0 07/26/2010   Lab Results  Component Value Date   CREATININE 0.8 12/11/2011   BUN 12 12/11/2011   NA 139 01/08/2012   K 4.3 12/11/2011   CL 89* 12/11/2011   CO2 29 12/11/2011      Assessment/Plan:  Diane Helling, MD [facility] Pager:  [phone] 04/02/2012, 2:50 PM

## 2012-04-02 NOTE — Assessment & Plan Note (Signed)
She has restarted CPAP.  She has not had contact with her DME, and does not recall which company she worked with.  Will get CPAP download, and then decide if she needs adjustments to her set up.

## 2012-04-02 NOTE — Assessment & Plan Note (Signed)
She has continued tobacco use.  She has chronic cough, and progressive dyspnea.  I am concerned she could have COPD.  Will start her empirically on spiriva, and continue prn albuterol.  Will arrange for chest xray and PFT to further assess.

## 2012-04-02 NOTE — Patient Instructions (Signed)
Chest xray today Will arrange for breathing test (PFT) Spiriva one puff daily Proair two puffs as needed for cough, wheeze, or chest congestion Will get report from CPAP machine Follow up in 6 weeks

## 2012-04-09 ENCOUNTER — Telehealth: Payer: Self-pay | Admitting: Pulmonary Disease

## 2012-04-09 NOTE — Telephone Encounter (Signed)
lmomtcb x1 

## 2012-04-09 NOTE — Telephone Encounter (Signed)
Dg Chest 2 View  04/02/2012  *RADIOLOGY REPORT*  Clinical Data: Cough and shortness of breath.  CHEST - 2 VIEW  Comparison: 03/03/2010  Findings: The cardiopericardial silhouette is enlarged.  Bibasilar atelectasis or scarring.  Interstitial markings are diffusely coarsened with chronic features. The patient is status post CABG. Imaged bony structures of the thorax are intact.  IMPRESSION: Cardiomegaly with vascular congestion and bibasilar chronic atelectasis or scarring.  Original Report Authenticated By: ERIC A. MANSELL, M.D.    Will have my nurse inform patient that CXR showed changes from prior heart surgery.  No acute findings.  No change to current treatment plan.

## 2012-04-10 NOTE — Telephone Encounter (Signed)
lmomtcb x 2  

## 2012-04-11 ENCOUNTER — Encounter: Payer: Self-pay | Admitting: *Deleted

## 2012-04-11 ENCOUNTER — Telehealth: Payer: Self-pay | Admitting: Pulmonary Disease

## 2012-04-11 NOTE — Telephone Encounter (Signed)
Will have my nurse inform patient that CXR showed changes from prior heart surgery. No acute findings. No change to current treatment plan.  I spoke with patient about results and she verbalized understanding and had no questions

## 2012-04-11 NOTE — Telephone Encounter (Signed)
lmomtcb x3--will send pt letter

## 2012-04-12 ENCOUNTER — Telehealth: Payer: Self-pay | Admitting: Pulmonary Disease

## 2012-04-12 ENCOUNTER — Encounter: Payer: Self-pay | Admitting: Pulmonary Disease

## 2012-04-12 DIAGNOSIS — G4733 Obstructive sleep apnea (adult) (pediatric): Secondary | ICD-10-CM

## 2012-04-12 NOTE — Telephone Encounter (Signed)
CPAP 06/13/11 to 07/10/11>>Used on 20 of 28 nights with median 3 hrs 45 min.  Average AHI 5 with median CPAP 7 cm H2O and 95th percentile CPAP 10 cm H2O.  Will have my nurse inform patient that CPAP report shows good control of sleep apnea with CPAP at 10 cm H2O.  I have sent order to adjust her CPAP setting.  She needs to use her CPAP for entire time she is asleep to get maximal benefit.

## 2012-04-14 NOTE — Telephone Encounter (Signed)
I spoke with patient about results and she verbalized understanding and had no questions 

## 2012-04-14 NOTE — Telephone Encounter (Signed)
Returning call.

## 2012-04-14 NOTE — Telephone Encounter (Signed)
lmomtcb x1 

## 2012-04-28 DIAGNOSIS — R5383 Other fatigue: Secondary | ICD-10-CM | POA: Diagnosis not present

## 2012-04-28 DIAGNOSIS — R5381 Other malaise: Secondary | ICD-10-CM | POA: Diagnosis not present

## 2012-04-28 DIAGNOSIS — M1710 Unilateral primary osteoarthritis, unspecified knee: Secondary | ICD-10-CM | POA: Diagnosis not present

## 2012-04-28 DIAGNOSIS — M76899 Other specified enthesopathies of unspecified lower limb, excluding foot: Secondary | ICD-10-CM | POA: Diagnosis not present

## 2012-04-28 DIAGNOSIS — IMO0001 Reserved for inherently not codable concepts without codable children: Secondary | ICD-10-CM | POA: Diagnosis not present

## 2012-05-12 ENCOUNTER — Encounter: Payer: Self-pay | Admitting: Pulmonary Disease

## 2012-05-12 ENCOUNTER — Ambulatory Visit (INDEPENDENT_AMBULATORY_CARE_PROVIDER_SITE_OTHER): Payer: Medicare Other | Admitting: Pulmonary Disease

## 2012-05-12 VITALS — BP 114/78 | HR 82 | Temp 98.0°F | Ht <= 58 in | Wt 163.0 lb

## 2012-05-12 DIAGNOSIS — G4733 Obstructive sleep apnea (adult) (pediatric): Secondary | ICD-10-CM | POA: Diagnosis not present

## 2012-05-12 DIAGNOSIS — F17200 Nicotine dependence, unspecified, uncomplicated: Secondary | ICD-10-CM | POA: Diagnosis not present

## 2012-05-12 DIAGNOSIS — J449 Chronic obstructive pulmonary disease, unspecified: Secondary | ICD-10-CM | POA: Diagnosis not present

## 2012-05-12 LAB — PULMONARY FUNCTION TEST

## 2012-05-12 MED ORDER — MOMETASONE FURO-FORMOTEROL FUM 200-5 MCG/ACT IN AERO: 2.0000 | INHALATION_SPRAY | Freq: Two times a day (BID) | RESPIRATORY_TRACT | Status: DC

## 2012-05-12 NOTE — Progress Notes (Signed)
PFT done today. 

## 2012-05-12 NOTE — Assessment & Plan Note (Signed)
Her CPAP download showed good control with CPAP 10 cm H2O.  Explained she needs to use CPAP for entire time she is asleep to get maximal benefit.

## 2012-05-12 NOTE — Assessment & Plan Note (Signed)
She is going to try e-cigarette.

## 2012-05-12 NOTE — Patient Instructions (Signed)
Stop spiriva Dulera two puffs twice per day, and rinse mouth after each use Follow up in 2 months

## 2012-05-12 NOTE — Assessment & Plan Note (Signed)
I have explained how continued tobacco use is the may culprit for her respiratory difficulties.  She did not notice benefit from spiriva>>will stop this.  She is to try dulera two puffs bid, and continue prn albuterol.  I have given sample of dulera, and then she is to fill script if she does not have side effects.  Reviewed proper use of inhaler, and advised to rinse mouth after each use.  Explained that she may not notice difference for several weeks, but she needs to be persistent with therapy.

## 2012-05-12 NOTE — Progress Notes (Signed)
Chief Complaint  Patient presents with  . Follow-up    Patient presents to discuss pft. c/o sob, wheezing,chest pain, chest tightness, and cough with greay mucus.     History of Present Illness: Diane [name] is a 56 y.o. female smoker with chronic cough, chronic bronchitis rhinitis, and OSA.  She is using her CPAP machine more, and feels this is easier to use.  She continues to smoke.  She has tried chantix, zyban, gum, and patch with benefit.  She has an e-cigarette, but has not started using yet.  She has been using spiriva, but this hasn't helped.  She uses albuterol, and this helps.   Past Medical History  Diagnosis Date  . Coronary artery disease     post CABG in 3/07   . Hypertension   . Hyperlipidemia   . Dyslipidemia   . Diabetes mellitus     type 2  . Chronic bronchitis   . CVA (cerebral infarction) 1993  . Tobacco abuse     Past Surgical History  Procedure Date  . Coronary stent placement 08/11/06    PCI of her ciurcumflex/OM vessel  . Cardiac catheterization 11/29/05    EF of 55%  . Cardiac catheterization 08/06/06    EF of 45-50%  . Coronary artery bypass graft 12/04/2005    x5 -- left internal mammary artery to the LAD, left radial artery to the ramus intermedius, saphenous vein graft to the obtuse marginal 1, sequential saphenous vein grat to the acute marginal and posterior descending, endoscopic vein harvesting from the left thigh with open vein harvest from right leg    Outpatient Encounter Prescriptions as of 05/12/2012  Medication Sig Dispense Refill  . albuterol (PROAIR HFA) 108 (90 BASE) MCG/ACT inhaler Inhale 2 puffs into the lungs every 6 (six) hours as needed for wheezing.  1 Inhaler  3  . albuterol (PROVENTIL) (2.5 MG/3ML) 0.083% nebulizer solution Take 2.5 mg by nebulization every 6 (six) hours as needed.       [name] aspirin 325 MG tablet Take 1 tablet (325 mg total) by mouth daily.  90 tablet  3  . carvedilol (COREG) 12.5 MG tablet TAKE 1 TABLET BY  MOUTH TWICE DAILY  180 tablet  2  . clopidogrel (PLAVIX) 75 MG tablet TAKE ONE TABLET BY MOUTH EVERY DAY  90 tablet  3  . CRESTOR 10 MG tablet TAKE 1 TABLET DAILY  90 tablet  2  . cyclobenzaprine (FLEXERIL) 10 MG tablet Take 10 mg by mouth as needed.        [name] DEXILANT 60 MG capsule TAKE 1 CAPSULE DAILY  30 capsule  0  . docusate sodium (COLACE) 100 MG capsule Take 1 capsule (100 mg total) by mouth 2 (two) times daily.  180 capsule  3  . glimepiride (AMARYL) 4 MG tablet 2 tablets by mouth in AM and 1 tablet by mouth in PM.       . guaiFENesin (MUCINEX) 600 MG 12 hr tablet Take 2 tablets (1,200 mg total) by mouth 2 (two) times daily.  360 tablet  3  . hydrochlorothiazide (HYDRODIURIL) 50 MG tablet TAKE ONE TABLET BY MOUTH EVERY DAY  90 tablet  3  . isosorbide mononitrate (IMDUR) 60 MG 24 hr tablet TAKE 1 TABLET (60MG  TOTAL) BY MOUTH TWICE A DAY  180 tablet  2  . losartan (COZAAR) 100 MG tablet Take 1 tablet (100 mg total) by mouth daily.  90 tablet  3  . nitroGLYCERIN (NITROSTAT) 0.4 MG  SL tablet Place 1 tablet (0.4 mg total) under the tongue every 5 (five) minutes as needed.  25 tablet  11  . Omega-3 Fatty Acids ([employer] NATURAL FISH OIL) 1200 MG CAPS Take 1 capsule by mouth 2 (two) times daily.  180 capsule  3  . potassium chloride (K-DUR,KLOR-CON) 10 MEQ tablet TAKE 1 TABLET TWICE DAILY  180 tablet  2  . RANEXA 500 MG 12 hr tablet TAKE 1 TABLET BY MOUTH TWICE A DAY  180 tablet  2  . sitaGLIPtan-metformin (JANUMET) 50-1000 MG per tablet Take 1 tablet by mouth 2 (two) times daily with a meal.        . traMADol (ULTRAM) 50 MG tablet Take 50 mg by mouth 2 (two) times daily.        [name] DISCONTD: tiotropium (SPIRIVA) 18 MCG inhalation capsule Place 1 capsule (18 mcg total) into inhaler and inhale daily.  30 capsule  6  . Mometasone Furo-Formoterol Fum 200-5 MCG/ACT AERO Inhale 2 puffs into the lungs 2 (two) times daily.  1 Inhaler  0  . OMEPRAZOLE PO Take 1 capsule by mouth 2 (two) times daily.          Allergies  Allergen Reactions  . Ace Inhibitors     REACTION: cough  . Eggs Or Egg-Derived Products     REACTION: reaction not know, no flu shots  . Tetracycline     REACTION: reaction not known    Physical Exam:  Blood pressure 114/78, pulse 82, temperature 98 F (36.7 C), temperature source Oral, height 4\' 10"  (1.473 m), weight 163 lb (73.936 kg), SpO2 96.00%.  Body mass index is 34.07 kg/(m^2).  Wt Readings from Last 2 Encounters:  05/12/12 163 lb (73.936 kg)  04/02/12 162 lb 9.6 oz (73.755 kg)    General - Obese HEENT - no sinus tenderness, no oral exudate, erythema of posterior pharynx, no LAN Cardiac - s1s2 regular, no murmur Chest - decreased breath sounds, normal respiratory excursion, no wheeze/rales Abd - soft, nontender Ext - no edema Neuro - normal strength Psych - normal mood, behavior   PFT 05/12/12>>FEV1 1.83 (98%), FEV1% 80, TLC 3.83 (98%), DLCO 61% (corrects for lung volumes), no BD response    Assessment/Plan:  Coralyn Helling, MD [facility] Pager:  [phone] 05/12/2012, 5:45 PM

## 2012-06-06 ENCOUNTER — Telehealth (INDEPENDENT_AMBULATORY_CARE_PROVIDER_SITE_OTHER): Payer: Medicare Other | Admitting: Family Medicine

## 2012-06-06 ENCOUNTER — Other Ambulatory Visit (INDEPENDENT_AMBULATORY_CARE_PROVIDER_SITE_OTHER): Payer: Medicare Other

## 2012-06-06 DIAGNOSIS — K219 Gastro-esophageal reflux disease without esophagitis: Secondary | ICD-10-CM | POA: Diagnosis not present

## 2012-06-06 DIAGNOSIS — E785 Hyperlipidemia, unspecified: Secondary | ICD-10-CM | POA: Diagnosis not present

## 2012-06-06 DIAGNOSIS — E119 Type 2 diabetes mellitus without complications: Secondary | ICD-10-CM | POA: Diagnosis not present

## 2012-06-06 DIAGNOSIS — I10 Essential (primary) hypertension: Secondary | ICD-10-CM

## 2012-06-06 LAB — MICROALBUMIN / CREATININE URINE RATIO
Creatinine,U: 121.5 mg/dL
Microalb Creat Ratio: 4 mg/g (ref 0.0–30.0)
Microalb, Ur: 4.9 mg/dL — ABNORMAL HIGH (ref 0.0–1.9)

## 2012-06-06 LAB — CBC WITH DIFFERENTIAL/PLATELET
Basophils Absolute: 0 10*3/uL (ref 0.0–0.1)
Basophils Relative: 0.5 % (ref 0.0–3.0)
Eosinophils Absolute: 0.2 10*3/uL (ref 0.0–0.7)
Eosinophils Relative: 1.7 % (ref 0.0–5.0)
HCT: 39.6 % (ref 36.0–46.0)
Hemoglobin: 12.5 g/dL (ref 12.0–15.0)
Lymphocytes Relative: 35.5 % (ref 12.0–46.0)
Lymphs Abs: 3.3 10*3/uL (ref 0.7–4.0)
MCHC: 31.6 g/dL (ref 30.0–36.0)
MCV: 74.3 fl — ABNORMAL LOW (ref 78.0–100.0)
Monocytes Absolute: 0.8 10*3/uL (ref 0.1–1.0)
Monocytes Relative: 9.3 % (ref 3.0–12.0)
Neutro Abs: 4.9 10*3/uL (ref 1.4–7.7)
Neutrophils Relative %: 53 % (ref 43.0–77.0)
Platelets: 343 10*3/uL (ref 150.0–400.0)
RBC: 5.32 Mil/uL — ABNORMAL HIGH (ref 3.87–5.11)
RDW: 20.2 % — ABNORMAL HIGH (ref 11.5–14.6)
WBC: 9.2 10*3/uL (ref 4.5–10.5)

## 2012-06-06 LAB — COMPREHENSIVE METABOLIC PANEL
ALT: 22 U/L (ref 0–35)
AST: 20 U/L (ref 0–37)
Albumin: 4 g/dL (ref 3.5–5.2)
Alkaline Phosphatase: 67 U/L (ref 39–117)
BUN: 7 mg/dL (ref 6–23)
CO2: 25 mEq/L (ref 19–32)
Calcium: 9.5 mg/dL (ref 8.4–10.5)
Chloride: 96 mEq/L (ref 96–112)
Creatinine, Ser: 0.6 mg/dL (ref 0.4–1.2)
GFR: 109.04 mL/min (ref >60.00)
Glucose, Bld: 135 mg/dL — ABNORMAL HIGH (ref 70–99)
Potassium: 4 mEq/L (ref 3.5–5.1)
Sodium: 133 mEq/L — ABNORMAL LOW (ref 135–145)
Total Bilirubin: 0.3 mg/dL (ref 0.3–1.2)
Total Protein: 7.2 g/dL (ref 6.0–8.3)

## 2012-06-06 LAB — LIPID PANEL
Cholesterol: 142 mg/dL (ref 0–200)
HDL: 46.4 mg/dL (ref >39.00)
Total CHOL/HDL Ratio: 3
Triglycerides: 210 mg/dL — ABNORMAL HIGH (ref 0.0–149.0)
VLDL: 42 mg/dL — ABNORMAL HIGH (ref 0.0–40.0)

## 2012-06-06 LAB — TSH: TSH: 2.63 u[IU]/mL (ref 0.35–5.50)

## 2012-06-06 NOTE — Telephone Encounter (Signed)
Message copied by Judy Pimple on Fri Jun 06, 2012  2:04 PM ------      Message from: Alvina Chou      Created: Fri Jun 06, 2012 11:38 AM      Regarding: lab orders asap       Patient is scheduled for CPX labs, please order future labs, Thanks , Terri            Blood and urine in lab,

## 2012-06-09 LAB — LDL CHOLESTEROL, DIRECT: Direct LDL: 79.3 mg/dL

## 2012-06-09 LAB — HEMOGLOBIN A1C: Hgb A1c MFr Bld: 7.4 % — ABNORMAL HIGH (ref 4.6–6.5)

## 2012-06-13 ENCOUNTER — Encounter: Payer: Self-pay | Admitting: Family Medicine

## 2012-06-13 ENCOUNTER — Ambulatory Visit (INDEPENDENT_AMBULATORY_CARE_PROVIDER_SITE_OTHER): Payer: Medicare Other | Admitting: Family Medicine

## 2012-06-13 VITALS — BP 136/74 | HR 90 | Temp 97.5°F | Ht <= 58 in | Wt 164.2 lb

## 2012-06-13 DIAGNOSIS — I10 Essential (primary) hypertension: Secondary | ICD-10-CM | POA: Diagnosis not present

## 2012-06-13 DIAGNOSIS — E119 Type 2 diabetes mellitus without complications: Secondary | ICD-10-CM | POA: Diagnosis not present

## 2012-06-13 DIAGNOSIS — F17200 Nicotine dependence, unspecified, uncomplicated: Secondary | ICD-10-CM

## 2012-06-13 DIAGNOSIS — J309 Allergic rhinitis, unspecified: Secondary | ICD-10-CM | POA: Insufficient documentation

## 2012-06-13 DIAGNOSIS — E785 Hyperlipidemia, unspecified: Secondary | ICD-10-CM

## 2012-06-13 DIAGNOSIS — Z1231 Encounter for screening mammogram for malignant neoplasm of breast: Secondary | ICD-10-CM | POA: Insufficient documentation

## 2012-06-13 DIAGNOSIS — K219 Gastro-esophageal reflux disease without esophagitis: Secondary | ICD-10-CM

## 2012-06-13 DIAGNOSIS — E871 Hypo-osmolality and hyponatremia: Secondary | ICD-10-CM

## 2012-06-13 MED ORDER — DEXLANSOPRAZOLE 60 MG PO CPDR
60.0000 mg | DELAYED_RELEASE_CAPSULE | Freq: Every day | ORAL | Status: DC
Start: 2012-06-13 — End: 2013-07-03

## 2012-06-13 NOTE — Assessment & Plan Note (Signed)
bp is stable today  No cp or palpitations or headaches or edema  No side effects to medicines  BP Readings from Last 3 Encounters:  06/13/12 136/74  05/12/12 114/78  04/02/12 126/78     bp in fair control at this time  No changes needed  Disc lifstyle change with low sodium diet and exercise

## 2012-06-13 NOTE — Patient Instructions (Addendum)
Flu shot today We will schedule a mammogram at check out  For allergies- get zyrtec (or store brand) 10 mg once daily  If you are interested in a shingles/zoster vaccine - call your insurance to check on coverage,( you should not get it within 1 month of other vaccines) , then call us for a prescription  for it to take to a pharmacy that gives the shot  Don't forget to follow up with Dr Talmage Nap Follow up with me in about 6 months

## 2012-06-13 NOTE — Assessment & Plan Note (Signed)
a1c 7.4 F/u pending with Dr Talmage Nap

## 2012-06-13 NOTE — Assessment & Plan Note (Signed)
Since singulair and clarinex did not work well - will try zyrtec otc and see if that works better  Disc allergen avoidance and smoking cessation

## 2012-06-13 NOTE — Assessment & Plan Note (Signed)
Refilled dexilant Omeprazole/ prilosec not working  Has complex gerd with dysphagia and throat symptoms

## 2012-06-13 NOTE — Progress Notes (Signed)
Subjective:    Patient ID: Diane [name], female    DOB: 10-31-1955, 56 y.o.   MRN: [id number]  HPI Here for check up of chronic medical conditions and to review health mt list  Has been doing fairly overall   Needs refil dexilant (out of it for months )  Dr Russella Dar transferred this to me  In past prilosec / omeprazole failed  Ins was not covering dexilant  Has swallowing problems Has had EGD in the past  gerd is waking her up during the night with burning and throat pain - she really needs the med   Also needs to go back on her allergy med Was on singulair and clarinex -- (did not work well ) Has not tried zyrtec    Wt is up 1 lb with bmi of 34   Pap She has appt with gyn upcoming  Still has pap yearly  Aug 2011 was the last one - also had ultrasound   mammo 4/11- needs [facility] Self exam-no lumps or changes   Colon cancer screen- last colonoscopy was with Dr Russella Dar was 2009- did have a polyp   Flu shot - does not get flu shot due to egg intol - but has never had trouble with a flu shot before - thinks she can get it  Egg intolerant - gi side eff (abd-no rash or sob or other)  Had pneumovax -- 2010 Wants shingles vaccine if covered   Dm-sees Dr Talmage Nap Will call for appt soon  opthy 12/12 a1c 7.4- pt states this is better   Cholesterol Lab Results  Component Value Date   CHOL 142 06/06/2012   CHOL 147 12/11/2011   CHOL 122 06/13/2011   Lab Results  Component Value Date   HDL 46.40 06/06/2012   HDL 45.50 12/11/2011   HDL 62.13 06/13/2011   Lab Results  Component Value Date   LDLCALC 46 06/13/2011   LDLCALC 59 05/11/2010   LDLCALC 55 11/15/2009   Lab Results  Component Value Date   TRIG 210.0* 06/06/2012   TRIG 237.0* 12/11/2011   TRIG 181.0* 06/13/2011   Lab Results  Component Value Date   CHOLHDL 3 06/06/2012   CHOLHDL 3 12/11/2011   CHOLHDL 3 06/13/2011   Lab Results  Component Value Date   LDLDIRECT 79.3 06/06/2012   LDLDIRECT 77.4 12/11/2011   LDLDIRECT 116.4 04/21/2008   well controlled currently  bp is stable today  No cp or palpitations or headaches or edema  No side effects to medicines  BP Readings from Last 3 Encounters:  06/13/12 136/74  05/12/12 114/78  04/02/12 126/78      Chemistry      Component Value Date/Time   NA 133* 06/06/2012 1407   K 4.0 06/06/2012 1407   CL 96 06/06/2012 1407   CO2 25 06/06/2012 1407   BUN 7 06/06/2012 1407   CREATININE 0.6 06/06/2012 1407      Component Value Date/Time   CALCIUM 9.5 06/06/2012 1407   ALKPHOS 67 06/06/2012 1407   AST 20 06/06/2012 1407   ALT 22 06/06/2012 1407   BILITOT 0.3 06/06/2012 1407     sodium still mildly low - on hctz  Has been lower in the past  She swells when she does not take the pill   Smoking - is really working on quitting  Using gum and e cigarette  Smoking varies day to day  pulm continues to tx her copd and last cxr  was unchanged  Patient Active Problem List  Diagnosis  . DIABETES MELLITUS, TYPE II  . HYPERLIPIDEMIA  . ANXIETY  . TOBACCO USE  . DEPRESSION  . OBSTRUCTIVE SLEEP APNEA  . MIGRAINE HEADACHE  . CARPAL TUNNEL SYNDROME, BILATERAL  . HYPERTENSION  . CORONARY ARTERY DISEASE  . CVA  . GERD  . FIBROMYALGIA  . DIZZINESS  . OTHER DYSPHAGIA  . Carotid artery disease  . Hyponatremia  . Chronic bronchitis   Past Medical History  Diagnosis Date  . Coronary artery disease     post CABG in 3/07   . Hypertension   . Hyperlipidemia   . Dyslipidemia   . Diabetes mellitus     type 2  . Chronic bronchitis   . CVA (cerebral infarction) 1993  . Tobacco abuse    Past Surgical History  Procedure Date  . Coronary stent placement 08/11/06    PCI of her ciurcumflex/OM vessel  . Cardiac catheterization 11/29/05    EF of 55%  . Cardiac catheterization 08/06/06    EF of 45-50%  . Coronary artery bypass graft 12/04/2005    x5 -- left internal mammary artery to the LAD, left radial artery to the ramus intermedius, saphenous vein graft to the obtuse  marginal 1, sequential saphenous vein grat to the acute marginal and posterior descending, endoscopic vein harvesting from the left thigh with open vein harvest from right leg   History  Substance Use Topics  . Smoking status: Current Some Day Smoker -- 36 years    Types: Cigarettes  . Smokeless tobacco: Never Used   Comment: 1.5-2 ppd  . Alcohol Use: No   Family History  Problem Relation Age of Onset  . Coronary artery disease    . Heart disease     Allergies  Allergen Reactions  . Ace Inhibitors     REACTION: cough  . Eggs Or Egg-Derived Products     REACTION: reaction not know, no flu shots  . Tetracycline     REACTION: reaction not known   Current Outpatient Prescriptions on File Prior to Visit  Medication Sig Dispense Refill  . albuterol (PROAIR HFA) 108 (90 BASE) MCG/ACT inhaler Inhale 2 puffs into the lungs every 6 (six) hours as needed for wheezing.  1 Inhaler  3  . albuterol (PROVENTIL) (2.5 MG/3ML) 0.083% nebulizer solution Take 2.5 mg by nebulization every 6 (six) hours as needed.       [name] aspirin 325 MG tablet Take 1 tablet (325 mg total) by mouth daily.  90 tablet  3  . carvedilol (COREG) 12.5 MG tablet TAKE 1 TABLET BY MOUTH TWICE DAILY  180 tablet  2  . clopidogrel (PLAVIX) 75 MG tablet TAKE ONE TABLET BY MOUTH EVERY DAY  90 tablet  3  . CRESTOR 10 MG tablet TAKE 1 TABLET DAILY  90 tablet  2  . cyclobenzaprine (FLEXERIL) 10 MG tablet Take 10 mg by mouth as needed.        [name] glimepiride (AMARYL) 4 MG tablet 2 tablets by mouth in AM and 1 tablet by mouth in PM.       . guaiFENesin (MUCINEX) 600 MG 12 hr tablet Take 2 tablets (1,200 mg total) by mouth 2 (two) times daily.  360 tablet  3  . hydrochlorothiazide (HYDRODIURIL) 50 MG tablet TAKE ONE TABLET BY MOUTH EVERY DAY  90 tablet  3  . isosorbide mononitrate (IMDUR) 60 MG 24 hr tablet TAKE 1 TABLET (60MG  TOTAL) BY MOUTH TWICE A  DAY  180 tablet  2  . losartan (COZAAR) 100 MG tablet Take 1 tablet (100 mg total) by mouth  daily.  90 tablet  3  . Mometasone Furo-Formoterol Fum 200-5 MCG/ACT AERO Inhale 2 puffs into the lungs 2 (two) times daily.  1 Inhaler  0  . nitroGLYCERIN (NITROSTAT) 0.4 MG SL tablet Place 1 tablet (0.4 mg total) under the tongue every 5 (five) minutes as needed.  25 tablet  11  . Omega-3 Fatty Acids ([employer] NATURAL FISH OIL) 1200 MG CAPS Take 1 capsule by mouth 2 (two) times daily.  180 capsule  3  . potassium chloride (K-DUR,KLOR-CON) 10 MEQ tablet TAKE 1 TABLET TWICE DAILY  180 tablet  2  . RANEXA 500 MG 12 hr tablet TAKE 1 TABLET BY MOUTH TWICE A DAY  180 tablet  2  . sitaGLIPtan-metformin (JANUMET) 50-1000 MG per tablet Take 1 tablet by mouth 2 (two) times daily with a meal.        . traMADol (ULTRAM) 50 MG tablet Take 50 mg by mouth 2 (two) times daily.        [name] DEXILANT 60 MG capsule TAKE 1 CAPSULE DAILY  30 capsule  0  . docusate sodium (COLACE) 100 MG capsule Take 1 capsule (100 mg total) by mouth 2 (two) times daily.  180 capsule  3  . OMEPRAZOLE PO Take 1 capsule by mouth 2 (two) times daily.           Review of Systems    Review of Systems  Constitutional: Negative for fever, appetite change,  and unexpected weight change. pos for chronic fatigue Eyes: Negative for pain and visual disturbance.  ENT pos for runny nose and sneezing  Respiratory: Negative for cough and pos for baseline sob and wheezing  Cardiovascular: Negative for cp or palpitations    Gastrointestinal: Negative for nausea, diarrhea and constipation.  Genitourinary: Negative for urgency and frequency.  Skin: Negative for pallor or rash   MSK pos for chronic back pain Neurological: Negative for weakness, light-headedness, numbness and headaches.  Hematological: Negative for adenopathy. Does not bruise/bleed easily.  Psychiatric/Behavioral: Negative for dysphoric mood. The patient is not nervous/anxious.      Objective:   Physical Exam  Constitutional: She appears well-developed and well-nourished. No  distress.  HENT:  Head: Normocephalic and atraumatic.  Right Ear: External ear normal.  Left Ear: External ear normal.  Nose: Nose normal.  Mouth/Throat: Oropharynx is clear and moist.       Nares are boggy  Eyes: Conjunctivae normal and EOM are normal. Pupils are equal, round, and reactive to light. Right eye exhibits no discharge. Left eye exhibits no discharge.  Neck: Normal range of motion. Neck supple. No JVD present. Carotid bruit is not present. No tracheal deviation present. No thyromegaly present.  Cardiovascular: Normal rate, regular rhythm, normal heart sounds and intact distal pulses.  Exam reveals no gallop.   Pulmonary/Chest: Effort normal and breath sounds normal. No respiratory distress. She has no wheezes.  Abdominal: Soft. Bowel sounds are normal. She exhibits no distension, no abdominal bruit and no mass. There is no tenderness.  Musculoskeletal: She exhibits no edema and no tenderness.       Poor rom of back  Lymphadenopathy:    She has no cervical adenopathy.  Neurological: She is alert. She has normal reflexes. No cranial nerve deficit. She exhibits normal muscle tone. Coordination normal.  Skin: Skin is warm and dry. No rash noted. No erythema. No pallor.  Psychiatric: She has a normal mood and affect.          Assessment & Plan:

## 2012-06-13 NOTE — Assessment & Plan Note (Signed)
Mild with hctz-asymptomatic Will continue to follow

## 2012-06-14 NOTE — Assessment & Plan Note (Signed)
This is stable on crestor and diet  Disc goals for lipids and reasons to control them Rev labs with pt Rev low sat fat diet in detail

## 2012-06-14 NOTE — Assessment & Plan Note (Signed)
Disc in detail risks of smoking and possible outcomes including copd, vascular/ heart disease, cancer , respiratory and sinus infections  Pt voices understanding  Pt unsure she is fully motivated to quit  She is trying to cut down

## 2012-06-14 NOTE — Assessment & Plan Note (Signed)
mammo scheduled Enc self exams Pt will get yearly exam at her gyn visit upcoming

## 2012-07-04 NOTE — Progress Notes (Signed)
I was at [facility] training this day and not working with Dr. Karie Schwalbe yet, if you gave a flu shot please update the chart, thanks

## 2012-08-06 ENCOUNTER — Ambulatory Visit (INDEPENDENT_AMBULATORY_CARE_PROVIDER_SITE_OTHER): Payer: Medicare Other | Admitting: Pulmonary Disease

## 2012-08-06 ENCOUNTER — Encounter: Payer: Self-pay | Admitting: Pulmonary Disease

## 2012-08-06 VITALS — BP 124/76 | HR 92 | Temp 97.5°F | Ht <= 58 in | Wt 162.2 lb

## 2012-08-06 DIAGNOSIS — J42 Unspecified chronic bronchitis: Secondary | ICD-10-CM | POA: Diagnosis not present

## 2012-08-06 DIAGNOSIS — G4733 Obstructive sleep apnea (adult) (pediatric): Secondary | ICD-10-CM

## 2012-08-06 DIAGNOSIS — J309 Allergic rhinitis, unspecified: Secondary | ICD-10-CM | POA: Diagnosis not present

## 2012-08-06 DIAGNOSIS — F17200 Nicotine dependence, unspecified, uncomplicated: Secondary | ICD-10-CM

## 2012-08-06 MED ORDER — SALINE NASAL SPRAY 0.65 % NA SOLN: 1.0000 | NASAL | Status: DC | PRN

## 2012-08-06 NOTE — Assessment & Plan Note (Signed)
Stable on current regimen   

## 2012-08-06 NOTE — Progress Notes (Signed)
Chief Complaint  Patient presents with  . Follow-up    breathing is okay, cough has unchanged-brings up beige/grey phlem, c/o wheezing, no chest tx    History of Present Illness: Diane [name] is a 56 y.o. female smoker with chronic cough, chronic bronchitis rhinitis, and OSA.  She continues to smoker 2 packs per day.  She has tried chantix and wellbutrin.  She is using E cigarette, and nicotine patch/gum.  She has occasional dry cough.  She is using albuterol twice per day.  She has sinus congestion with post-nasal drip.  This triggers her cough.  She is using nasal irrigation daily.  She uses her CPAP 5 to 6 hours per day.  She goes to bed at 7 am and wakes up at 1 pm.  She is comfortable with this sleep schedule, and has done this for years. 2 ppd  Tests: PSG 04/10/06>> RDI 105  CPAP 06/13/11 to 07/10/11>>Used on 20 of 28 nights with median 3 hrs 45 min.  Average AHI 5 with median CPAP 7 cm H2O and 95th percentile CPAP 10 cm H2O. PFT 05/12/12>>FEV1 1.83 (98%), FEV1% 80, TLC 3.83 (98%), DLCO 61% (corrects for lung volumes), no BD response   Past Medical History  Diagnosis Date  . Coronary artery disease     post CABG in 3/07   . Hypertension   . Hyperlipidemia   . Dyslipidemia   . Diabetes mellitus     type 2  . Chronic bronchitis   . CVA (cerebral infarction) 1993  . Tobacco abuse     Past Surgical History  Procedure Date  . Coronary stent placement 08/11/06    PCI of her ciurcumflex/OM vessel  . Cardiac catheterization 11/29/05    EF of 55%  . Cardiac catheterization 08/06/06    EF of 45-50%  . Coronary artery bypass graft 12/04/2005    x5 -- left internal mammary artery to the LAD, left radial artery to the ramus intermedius, saphenous vein graft to the obtuse marginal 1, sequential saphenous vein grat to the acute marginal and posterior descending, endoscopic vein harvesting from the left thigh with open vein harvest from right leg    Outpatient Encounter  Prescriptions as of 08/06/2012  Medication Sig Dispense Refill  . albuterol (PROAIR HFA) 108 (90 BASE) MCG/ACT inhaler Inhale 2 puffs into the lungs every 6 (six) hours as needed for wheezing.  1 Inhaler  3  . albuterol (PROVENTIL) (2.5 MG/3ML) 0.083% nebulizer solution Take 2.5 mg by nebulization every 6 (six) hours as needed.       [name] aspirin 325 MG tablet Take 1 tablet (325 mg total) by mouth daily.  90 tablet  3  . carvedilol (COREG) 12.5 MG tablet TAKE 1 TABLET BY MOUTH TWICE DAILY  180 tablet  2  . cetirizine (ZYRTEC) 10 MG tablet Take 10 mg by mouth daily.      . clopidogrel (PLAVIX) 75 MG tablet TAKE ONE TABLET BY MOUTH EVERY DAY  90 tablet  3  . CRESTOR 10 MG tablet TAKE 1 TABLET DAILY  90 tablet  2  . cyclobenzaprine (FLEXERIL) 10 MG tablet Take 10 mg by mouth as needed.        [name] dexlansoprazole (DEXILANT) 60 MG capsule Take 1 capsule (60 mg total) by mouth daily.  30 capsule  11  . glimepiride (AMARYL) 4 MG tablet 2 tablets by mouth in AM and 1 tablet by mouth in PM.       . guaiFENesin (MUCINEX)  600 MG 12 hr tablet Take 2 tablets (1,200 mg total) by mouth 2 (two) times daily.  360 tablet  3  . hydrochlorothiazide (HYDRODIURIL) 50 MG tablet TAKE ONE TABLET BY MOUTH EVERY DAY  90 tablet  3  . isosorbide mononitrate (IMDUR) 60 MG 24 hr tablet TAKE 1 TABLET (60MG  TOTAL) BY MOUTH TWICE A DAY  180 tablet  2  . losartan (COZAAR) 100 MG tablet Take 1 tablet (100 mg total) by mouth daily.  90 tablet  3  . Mometasone Furo-Formoterol Fum 200-5 MCG/ACT AERO Inhale 2 puffs into the lungs 2 (two) times daily.  1 Inhaler  0  . nitroGLYCERIN (NITROSTAT) 0.4 MG SL tablet Place 1 tablet (0.4 mg total) under the tongue every 5 (five) minutes as needed.  25 tablet  11  . Omega-3 Fatty Acids ([employer] NATURAL FISH OIL) 1200 MG CAPS Take 1 capsule by mouth 2 (two) times daily.  180 capsule  3  . potassium chloride (K-DUR,KLOR-CON) 10 MEQ tablet TAKE 1 TABLET TWICE DAILY  180 tablet  2  . RANEXA 500 MG 12 hr  tablet TAKE 1 TABLET BY MOUTH TWICE A DAY  180 tablet  2  . sitaGLIPtan-metformin (JANUMET) 50-1000 MG per tablet Take 1 tablet by mouth 2 (two) times daily with a meal.        . traMADol (ULTRAM) 50 MG tablet Take 50 mg by mouth 2 (two) times daily.          Allergies  Allergen Reactions  . Ace Inhibitors     REACTION: cough  . Eggs Or Egg-Derived Products     Abdominal discomfort-no allergy She can get a flu shot   . Tetracycline     REACTION: reaction not known    Physical Exam:  Filed Vitals:   08/06/12 1347  BP: 124/76  Pulse: 92  Temp: 97.5 F (36.4 C)  TempSrc: Oral  Height: 4\' 10"  (1.473 m)  Weight: 162 lb 3.2 oz (73.573 kg)  SpO2: 98%    Body mass index is 33.90 kg/(m^2).   Wt Readings from Last 2 Encounters:  08/06/12 162 lb 3.2 oz (73.573 kg)  06/13/12 164 lb 4 oz (74.503 kg)    General - Obese HEENT - no sinus tenderness, clear nasal discharge, no oral exudate, no LAN Cardiac - s1s2 regular, no murmur Chest - decreased breath sounds, normal respiratory excursion, no wheeze/rales Abd - soft, nontender Ext - no edema Neuro - normal strength Psych - normal mood, behavior   Assessment/Plan:  Coralyn Helling, MD [facility] Pager:  [phone] 08/06/2012, 1:57 PM

## 2012-08-06 NOTE — Assessment & Plan Note (Signed)
Encouraged her to continue with smoking cessation efforts. 

## 2012-08-06 NOTE — Assessment & Plan Note (Signed)
She reports benefit and compliance with therapy.

## 2012-08-06 NOTE — Patient Instructions (Signed)
Follow up in 6 months 

## 2012-08-06 NOTE — Assessment & Plan Note (Signed)
Continue nasal irrigation.

## 2012-08-13 ENCOUNTER — Other Ambulatory Visit: Payer: Self-pay

## 2012-08-13 MED ORDER — ASPIRIN 325 MG PO TABS: 325.0000 mg | ORAL_TABLET | Freq: Every day | ORAL | Status: DC

## 2012-08-13 MED ORDER — CETIRIZINE HCL 10 MG PO TABS: 10.0000 mg | ORAL_TABLET | Freq: Every day | ORAL | Status: DC

## 2012-08-13 MED ORDER — CVS NATURAL FISH OIL 1200 MG PO CAPS: 1.0000 | ORAL_CAPSULE | Freq: Two times a day (BID) | ORAL | Status: DC

## 2012-08-13 MED ORDER — DOCUSATE SODIUM 100 MG PO CAPS: 100.0000 mg | ORAL_CAPSULE | Freq: Two times a day (BID) | ORAL | Status: DC

## 2012-08-13 MED ORDER — GUAIFENESIN ER 600 MG PO TB12: 1200.0000 mg | ORAL_TABLET | Freq: Two times a day (BID) | ORAL | Status: DC

## 2012-08-13 NOTE — Telephone Encounter (Signed)
Rx printed out an placed in your inbox

## 2012-08-13 NOTE — Telephone Encounter (Signed)
Pt request written rx for docusate Na, ASA,fish oil, mucinex and zyrtec. Time for recertification of pts apartment and needs written rx for OTC meds; that will go toward pts recert.call when ready for pick up.

## 2012-08-13 NOTE — Telephone Encounter (Signed)
Please print them out for 90 day supplies and I will sign them

## 2012-08-26 ENCOUNTER — Telehealth: Payer: Self-pay | Admitting: Pulmonary Disease

## 2012-08-26 ENCOUNTER — Other Ambulatory Visit: Payer: Self-pay | Admitting: Family Medicine

## 2012-08-26 MED ORDER — ALBUTEROL SULFATE (2.5 MG/3ML) 0.083% IN NEBU: 2.5000 mg | INHALATION_SOLUTION | Freq: Four times a day (QID) | RESPIRATORY_TRACT | Status: DC | PRN

## 2012-08-26 NOTE — Telephone Encounter (Signed)
Pt aware rx has been sent to [facility] and nothing further was needed

## 2012-09-01 ENCOUNTER — Other Ambulatory Visit: Payer: Self-pay | Admitting: Obstetrics and Gynecology

## 2012-09-01 ENCOUNTER — Ambulatory Visit (INDEPENDENT_AMBULATORY_CARE_PROVIDER_SITE_OTHER): Payer: Medicare Other | Admitting: Obstetrics and Gynecology

## 2012-09-01 ENCOUNTER — Encounter: Payer: Self-pay | Admitting: Obstetrics and Gynecology

## 2012-09-01 VITALS — BP 102/62 | Resp 14 | Ht <= 58 in | Wt 162.0 lb

## 2012-09-01 DIAGNOSIS — R1031 Right lower quadrant pain: Secondary | ICD-10-CM

## 2012-09-01 DIAGNOSIS — R3911 Hesitancy of micturition: Secondary | ICD-10-CM

## 2012-09-01 DIAGNOSIS — G8929 Other chronic pain: Secondary | ICD-10-CM

## 2012-09-01 DIAGNOSIS — R300 Dysuria: Secondary | ICD-10-CM

## 2012-09-01 DIAGNOSIS — Z1231 Encounter for screening mammogram for malignant neoplasm of breast: Secondary | ICD-10-CM

## 2012-09-01 DIAGNOSIS — E049 Nontoxic goiter, unspecified: Secondary | ICD-10-CM

## 2012-09-01 DIAGNOSIS — Z124 Encounter for screening for malignant neoplasm of cervix: Secondary | ICD-10-CM

## 2012-09-01 DIAGNOSIS — Z1151 Encounter for screening for human papillomavirus (HPV): Secondary | ICD-10-CM | POA: Diagnosis not present

## 2012-09-01 LAB — POCT URINALYSIS DIPSTICK
Bilirubin, UA: NEGATIVE
Blood, UA: NEGATIVE
Glucose, UA: NEGATIVE
Ketones, UA: NEGATIVE
Nitrite, UA: NEGATIVE
Protein, UA: 1
Spec Grav, UA: 1.01
Urobilinogen, UA: NEGATIVE
pH, UA: 7

## 2012-09-01 NOTE — Progress Notes (Signed)
Subjective:    Diane [name] is a 56 y.o. female G1P0 who presents for annual exam.  C/O intermittent rlq pain not requiring additional pain meds. Has hx of right hydrosalpinx on ultrasound and nl Ca125.   Sees Dr. Corliss Skains for fibromyalgia C/o difficulty starting urinary stream on occasion.  No inconstinence except if bladder is over-full  Last Pap: 04/20/10 WNL: Yes Regular Periods:yes Contraception: PM  Monthly Breast exam:yes Tetanus<53yrs:yes Nl.Bladder Function: no - pt has to strain while attempting to void Daily BMs:yes Healthy Diet:no Calcium:yes Mammogram:no Date of Mammogram: 11/26/2005 Exercise:yes Have often Exercise: "sometimes" Seatbelt: yes Abuse at home: no Stressful work:no Sigmoid-colonoscopy: 2012 polyps removed WNL Bone Density: Yes 05/08/10 PCP: [facility], [place] Change in PMH: New goiter with mild difficullty swallowing. Followed by PCP Change in FMH:no change     The following portions of the patient's history were reviewed and updated as appropriate: allergies, current medications, past family history, past medical history, past social history, past surgical history and problem list.  Review of Systems Pertinent items are noted in HPI. Gastrointestinal:No change in bowel habits, no abdominal pain, no rectal bleeding Genitourinary:negative for dysuria, frequency, hematuria, nocturia and urinary incontinence    Objective:     BP 102/62  Resp 14  Ht 4\' 10"  (1.473 m)  Wt 162 lb (73.483 kg)  BMI 33.86 kg/m2  Breastfeeding? Unknown  Weight:  Wt Readings from Last 1 Encounters:  09/01/12 162 lb (73.483 kg)     BMI: Body mass index is 33.86 kg/(m^2). General Appearance: Alert, appropriate appearance for age. No acute distress HEENT: Grossly normal Neck / Thyroid: Supple, Thyroid enlarged to twice its size but non tender Lungs: clear to auscultation bilaterally Back: No CVA tenderness Breast Exam: No masses or nodes.No dimpling, nipple retraction or  discharge. Cardiovascular: Regular rate and rhythm. S1, S2, no murmur Gastrointestinal: Soft, non-tender, no masses or organomegaly Pelvic Exam: External genitalia: normal general appearance Vaginal: atrophic mucosa Cervix: normal appearance Adnexa: non palpable Uterus: doesn't feel enlarged Exam limited by body habitus Rectovaginal: normal rectal, no masses Lymphatic Exam: Non-palpable nodes in neck, clavicular, axillary, or inguinal regions Skin: no rash or abnormalities Neurologic: Normal gait and speech, no tremor  Psychiatric: Alert and oriented, appropriate affect.    Urinalysis:normal    Assessment:    Difficulty initiating urinary stream  Goiter   Plan:   mammogram pap smear return annually or prn Pt declines urology workup of hesitancy    Dierdre Forth MD    L

## 2012-09-02 LAB — PAP IG AND HPV HIGH-RISK: HPV DNA High Risk: DETECTED — AB

## 2012-09-12 LAB — HPV TYPE 16/18
HPV Genotype, 16: NOT DETECTED
HPV Genotype, 18: NOT DETECTED

## 2012-10-02 ENCOUNTER — Other Ambulatory Visit: Payer: Self-pay | Admitting: Obstetrics and Gynecology

## 2012-10-02 DIAGNOSIS — Z124 Encounter for screening for malignant neoplasm of cervix: Secondary | ICD-10-CM

## 2012-10-03 ENCOUNTER — Other Ambulatory Visit: Payer: Self-pay

## 2012-10-03 MED ORDER — LOSARTAN POTASSIUM 100 MG PO TABS: 100.0000 mg | ORAL_TABLET | Freq: Every day | ORAL | Status: DC

## 2012-10-17 ENCOUNTER — Other Ambulatory Visit: Payer: Self-pay | Admitting: Cardiology

## 2012-10-20 ENCOUNTER — Telehealth: Payer: Self-pay

## 2012-10-20 NOTE — Telephone Encounter (Signed)
Called patient and left message on answering machine to call back so I could verify if she picked up her Losartan that was sent to the pharmacy on 10/03/12

## 2012-11-04 ENCOUNTER — Encounter (INDEPENDENT_AMBULATORY_CARE_PROVIDER_SITE_OTHER): Payer: Medicare Other

## 2012-11-04 DIAGNOSIS — I6529 Occlusion and stenosis of unspecified carotid artery: Secondary | ICD-10-CM

## 2012-11-12 ENCOUNTER — Other Ambulatory Visit: Payer: Self-pay | Admitting: Cardiology

## 2012-11-12 ENCOUNTER — Other Ambulatory Visit: Payer: Self-pay | Admitting: Family Medicine

## 2012-11-14 ENCOUNTER — Other Ambulatory Visit: Payer: Self-pay | Admitting: Cardiology

## 2012-11-14 ENCOUNTER — Other Ambulatory Visit: Payer: Self-pay | Admitting: Family Medicine

## 2012-12-12 ENCOUNTER — Ambulatory Visit (INDEPENDENT_AMBULATORY_CARE_PROVIDER_SITE_OTHER): Payer: Medicare Other | Admitting: Family Medicine

## 2012-12-12 ENCOUNTER — Encounter: Payer: Self-pay | Admitting: Family Medicine

## 2012-12-12 VITALS — BP 138/68 | HR 101 | Temp 96.0°F | Ht <= 58 in | Wt 163.8 lb

## 2012-12-12 DIAGNOSIS — I10 Essential (primary) hypertension: Secondary | ICD-10-CM | POA: Diagnosis not present

## 2012-12-12 DIAGNOSIS — F17200 Nicotine dependence, unspecified, uncomplicated: Secondary | ICD-10-CM | POA: Diagnosis not present

## 2012-12-12 DIAGNOSIS — E785 Hyperlipidemia, unspecified: Secondary | ICD-10-CM

## 2012-12-12 LAB — COMPREHENSIVE METABOLIC PANEL
ALT: 17 U/L (ref 0–35)
AST: 16 U/L (ref 0–37)
Albumin: 3.4 g/dL — ABNORMAL LOW (ref 3.5–5.2)
Alkaline Phosphatase: 67 U/L (ref 39–117)
BUN: 14 mg/dL (ref 6–23)
CO2: 27 mEq/L (ref 19–32)
Calcium: 8.9 mg/dL (ref 8.4–10.5)
Chloride: 97 mEq/L (ref 96–112)
Creatinine, Ser: 0.7 mg/dL (ref 0.4–1.2)
GFR: 92.63 mL/min (ref >60.00)
Glucose, Bld: 163 mg/dL — ABNORMAL HIGH (ref 70–99)
Potassium: 4.7 mEq/L (ref 3.5–5.1)
Sodium: 133 mEq/L — ABNORMAL LOW (ref 135–145)
Total Bilirubin: 0.4 mg/dL (ref 0.3–1.2)
Total Protein: 6.7 g/dL (ref 6.0–8.3)

## 2012-12-12 LAB — LIPID PANEL
Cholesterol: 111 mg/dL (ref 0–200)
HDL: 33.5 mg/dL — ABNORMAL LOW (ref >39.00)
Total CHOL/HDL Ratio: 3
Triglycerides: 207 mg/dL — ABNORMAL HIGH (ref 0.0–149.0)
VLDL: 41.4 mg/dL — ABNORMAL HIGH (ref 0.0–40.0)

## 2012-12-12 NOTE — Progress Notes (Signed)
Subjective:    Patient ID: Diane [name], female    DOB: 1956-02-22, 57 y.o.   MRN: [id number]  HPI Here for f/u of chronic medical problems   Is doing ok overall  Has had some neck spasms  Hard to get back out - today R side hurts - massages it   bp is stable today  No cp or palpitations or headaches or edema  No side effects to medicines  BP Readings from Last 3 Encounters:  12/12/12 138/68  09/01/12 102/62  08/06/12 124/76    No new heart developments  Had her carotid arteries checked -no change in 6 months   Is still smoking about the same-not ready to quit yet   Wt is pretty stable - 34 bmi A little walking   DM is doing ok  Thinks her last a1c was 7 - with Dr Talmage Nap (she is overdue to be seen) Is eating a fairly healthy diet - so/ so (does not overdo anything and does stay away from the sweets)     Chemistry      Component Value Date/Time   NA 133* 06/06/2012 1407   K 4.0 06/06/2012 1407   CL 96 06/06/2012 1407   CO2 25 06/06/2012 1407   BUN 7 06/06/2012 1407   CREATININE 0.6 06/06/2012 1407      Component Value Date/Time   CALCIUM 9.5 06/06/2012 1407   ALKPHOS 67 06/06/2012 1407   AST 20 06/06/2012 1407   ALT 22 06/06/2012 1407   BILITOT 0.3 06/06/2012 1407      Lab Results  Component Value Date   CHOL 142 06/06/2012   HDL 46.40 06/06/2012   LDLCALC 46 06/13/2011   LDLDIRECT 79.3 06/06/2012   TRIG 210.0* 06/06/2012   CHOLHDL 3 06/06/2012    Patient Active Problem List  Diagnosis  . DIABETES MELLITUS, TYPE II  . HYPERLIPIDEMIA  . ANXIETY  . TOBACCO USE  . DEPRESSION  . OBSTRUCTIVE SLEEP APNEA  . MIGRAINE HEADACHE  . CARPAL TUNNEL SYNDROME, BILATERAL  . HYPERTENSION  . CORONARY ARTERY DISEASE  . CVA  . GERD  . FIBROMYALGIA  . DIZZINESS  . OTHER DYSPHAGIA  . Carotid artery disease  . Hyponatremia  . Chronic bronchitis  . Other screening mammogram  . Allergic rhinitis  . Goiter   Past Medical History  Diagnosis Date  . Coronary artery disease     post CABG in  3/07   . Hypertension   . Hyperlipidemia   . Dyslipidemia   . Diabetes mellitus     type 2  . Chronic bronchitis   . CVA (cerebral infarction) 1993  . Tobacco abuse   . COPD (chronic obstructive pulmonary disease)   . Stroke 1993   Past Surgical History  Procedure Laterality Date  . Coronary stent placement  08/11/06    PCI of her ciurcumflex/OM vessel  . Cardiac catheterization  11/29/05    EF of 55%  . Cardiac catheterization  08/06/06    EF of 45-50%  . Coronary artery bypass graft  12/04/2005    x5 -- left internal mammary artery to the LAD, left radial artery to the ramus intermedius, saphenous vein graft to the obtuse marginal 1, sequential saphenous vein grat to the acute marginal and posterior descending, endoscopic vein harvesting from the left thigh with open vein harvest from right leg  . Heart bypass  2007   History  Substance Use Topics  . Smoking status: Current Some Day Smoker --  2.00 packs/day for 36 years    Types: Cigarettes  . Smokeless tobacco: Never Used     Comment: 1.5-2 ppd  . Alcohol Use: 0.5 oz/week    1 drink(s) per week     Comment: occ   Family History  Problem Relation Age of Onset  . Coronary artery disease    . Heart disease    . Heart disease Mother   . Diabetes Mother   . COPD Mother   . Hyperlipidemia Mother   . Hypertension Mother   . Cancer Father   . Drug abuse Paternal Grandmother   . Stroke Paternal Grandfather    Allergies  Allergen Reactions  . Ace Inhibitors     REACTION: cough  . Eggs Or Egg-Derived Products     Abdominal discomfort-no allergy She can get a flu shot   . Tetracycline     REACTION: reaction not known   Current Outpatient Prescriptions on File Prior to Visit  Medication Sig Dispense Refill  . albuterol (PROAIR HFA) 108 (90 BASE) MCG/ACT inhaler Inhale 2 puffs into the lungs every 6 (six) hours as needed for wheezing.  1 Inhaler  3  . albuterol (PROVENTIL) (2.5 MG/3ML) 0.083% nebulizer solution Take 3 mLs  (2.5 mg total) by nebulization every 6 (six) hours as needed.  360 mL  3  . aspirin 325 MG tablet Take 1 tablet (325 mg total) by mouth daily.  90 tablet  0  . carvedilol (COREG) 12.5 MG tablet TAKE 1 TABLET BY MOUTH TWICE DAILY  180 tablet  0  . cetirizine (ZYRTEC) 10 MG tablet Take 1 tablet (10 mg total) by mouth daily.  90 tablet  0  . clopidogrel (PLAVIX) 75 MG tablet TAKE ONE TABLET BY MOUTH EVERY DAY  90 tablet  1  . CRESTOR 10 MG tablet TAKE 1 TABLET DAILY  90 tablet  0  . cyclobenzaprine (FLEXERIL) 10 MG tablet Take 10 mg by mouth as needed.        [name] dexlansoprazole (DEXILANT) 60 MG capsule Take 1 capsule (60 mg total) by mouth daily.  30 capsule  11  . docusate sodium (COLACE) 100 MG capsule Take 1 capsule (100 mg total) by mouth 2 (two) times daily.  180 capsule  0  . glimepiride (AMARYL) 4 MG tablet 2 tablets by mouth in AM and 1 tablet by mouth in PM.       . guaiFENesin (MUCINEX) 600 MG 12 hr tablet Take 2 tablets (1,200 mg total) by mouth 2 (two) times daily.  360 tablet  0  . hydrochlorothiazide (HYDRODIURIL) 50 MG tablet TAKE ONE TABLET BY MOUTH EVERY DAY  90 tablet  0  . isosorbide mononitrate (IMDUR) 60 MG 24 hr tablet TAKE 1 TABLET (60MG  TOTAL) BY MOUTH TWICE A DAY  180 tablet  0  . KLOR-CON M10 10 MEQ tablet TAKE 1 TABLET TWICE DAILY  180 tablet  1  . losartan (COZAAR) 100 MG tablet Take 1 tablet (100 mg total) by mouth daily.  90 tablet  3  . Mometasone Furo-Formoterol Fum 200-5 MCG/ACT AERO Inhale 2 puffs into the lungs 2 (two) times daily.  1 Inhaler  0  . nitroGLYCERIN (NITROSTAT) 0.4 MG SL tablet Place 1 tablet (0.4 mg total) under the tongue every 5 (five) minutes as needed.  25 tablet  11  . Omega-3 Fatty Acids ([employer] NATURAL FISH OIL) 1200 MG CAPS Take 1 capsule by mouth 2 (two) times daily.  180 capsule  0  .  RANEXA 500 MG 12 hr tablet TAKE 1 TABLET BY MOUTH TWICE A DAY  180 tablet  1  . sitaGLIPtan-metformin (JANUMET) 50-1000 MG per tablet Take 1 tablet by mouth 2 (two)  times daily with a meal.        . sodium chloride ([employer] SALINE NASAL SPRAY) 0.65 % nasal spray Place 1 spray into the nose as needed for congestion.  30 mL  12  . traMADol (ULTRAM) 50 MG tablet Take 50 mg by mouth 2 (two) times daily.         No current facility-administered medications on file prior to visit.    Review of Systems Review of Systems  Constitutional: Negative for fever, appetite change, fatigue and unexpected weight change.  Eyes: Negative for pain and visual disturbance.  Respiratory: Negative for cough and pos for baseline sob from smoking /copd Cardiovascular: Negative for cp or palpitations    Gastrointestinal: Negative for nausea, diarrhea and constipation.  Genitourinary: Negative for urgency and frequency.  Skin: Negative for pallor or rash   MSK pos for aches and pains- today her neck is sore , neg for acute joint swelling  Neurological: Negative for weakness, light-headedness, numbness and headaches.  Hematological: Negative for adenopathy. Does not bruise/bleed easily.  Psychiatric/Behavioral: Negative for dysphoric mood. The patient is not nervous/anxious.         Objective:   Physical Exam  Constitutional: She appears well-developed and well-nourished. No distress.  obese and well appearing   HENT:  Head: Normocephalic and atraumatic.  Mouth/Throat: Oropharynx is clear and moist.  Eyes: Conjunctivae and EOM are normal. Pupils are equal, round, and reactive to light. Right eye exhibits no discharge. Left eye exhibits no discharge. No scleral icterus.  Neck: Normal range of motion. Neck supple. No JVD present. Carotid bruit is not present. No thyromegaly present.  Cardiovascular: Normal rate, regular rhythm, normal heart sounds and intact distal pulses.  Exam reveals no gallop.   Pulmonary/Chest: Effort normal and breath sounds normal. No respiratory distress. She has no wheezes.  Diffusely distant bs    Abdominal: Soft. Bowel sounds are normal. She exhibits  no distension, no abdominal bruit and no mass. There is no tenderness.  Musculoskeletal: She exhibits no edema and no tenderness.  Lymphadenopathy:    She has no cervical adenopathy.  Neurological: She is alert. She has normal reflexes. No cranial nerve deficit. She exhibits normal muscle tone. Coordination normal.  Skin: Skin is warm and dry. No rash noted. No erythema. No pallor.  Psychiatric: She has a normal mood and affect.          Assessment & Plan:

## 2012-12-12 NOTE — Patient Instructions (Addendum)
Don't forget to see Dr Talmage Nap for your diabetes visit  Blood pressure is good  Labs today  Follow up with me in 6 months for an annual exam with labs prior

## 2012-12-14 NOTE — Assessment & Plan Note (Signed)
Lab today Hx of CAD/ carotid dz and stroke Rev low sat fat diet in detail

## 2012-12-14 NOTE — Assessment & Plan Note (Signed)
bp in fair control at this time  No changes needed  Disc lifstyle change with low sodium diet and exercise  Lab today 

## 2012-12-14 NOTE — Assessment & Plan Note (Signed)
Disc in detail risks of smoking and possible outcomes including copd, vascular/ heart disease, cancer , respiratory and sinus infections  Pt voices understanding She is not ready to quit at this time  

## 2012-12-15 LAB — LDL CHOLESTEROL, DIRECT: Direct LDL: 53.6 mg/dL

## 2012-12-16 ENCOUNTER — Encounter: Payer: Self-pay | Admitting: *Deleted

## 2013-02-04 ENCOUNTER — Telehealth: Payer: Self-pay | Admitting: Pulmonary Disease

## 2013-02-04 NOTE — Telephone Encounter (Signed)
Called pt x's 3 to make next ov per recall.  Pt never returned calls.  Mailed recall letter 02/04/13. Diane [name] °

## 2013-03-02 ENCOUNTER — Emergency Department (HOSPITAL_COMMUNITY): Payer: Medicare Other

## 2013-03-02 ENCOUNTER — Inpatient Hospital Stay (HOSPITAL_COMMUNITY)
Admission: EM | Admit: 2013-03-02 | Discharge: 2013-03-06 | DRG: 100 | Disposition: A | Discharge status: Skilled Nursing Facility | Payer: Medicare Other | Attending: Internal Medicine | Admitting: Internal Medicine

## 2013-03-02 ENCOUNTER — Encounter (HOSPITAL_COMMUNITY): Payer: Self-pay | Admitting: Emergency Medicine

## 2013-03-02 DIAGNOSIS — Z951 Presence of aortocoronary bypass graft: Secondary | ICD-10-CM

## 2013-03-02 DIAGNOSIS — I10 Essential (primary) hypertension: Secondary | ICD-10-CM | POA: Diagnosis not present

## 2013-03-02 DIAGNOSIS — Z8673 Personal history of transient ischemic attack (TIA), and cerebral infarction without residual deficits: Secondary | ICD-10-CM

## 2013-03-02 DIAGNOSIS — E785 Hyperlipidemia, unspecified: Secondary | ICD-10-CM

## 2013-03-02 DIAGNOSIS — M6281 Muscle weakness (generalized): Secondary | ICD-10-CM | POA: Diagnosis not present

## 2013-03-02 DIAGNOSIS — E049 Nontoxic goiter, unspecified: Secondary | ICD-10-CM

## 2013-03-02 DIAGNOSIS — F411 Generalized anxiety disorder: Secondary | ICD-10-CM

## 2013-03-02 DIAGNOSIS — J449 Chronic obstructive pulmonary disease, unspecified: Secondary | ICD-10-CM | POA: Diagnosis present

## 2013-03-02 DIAGNOSIS — I25810 Atherosclerosis of coronary artery bypass graft(s) without angina pectoris: Secondary | ICD-10-CM | POA: Diagnosis present

## 2013-03-02 DIAGNOSIS — R42 Dizziness and giddiness: Secondary | ICD-10-CM

## 2013-03-02 DIAGNOSIS — R569 Unspecified convulsions: Secondary | ICD-10-CM | POA: Diagnosis not present

## 2013-03-02 DIAGNOSIS — I6350 Cerebral infarction due to unspecified occlusion or stenosis of unspecified cerebral artery: Secondary | ICD-10-CM | POA: Diagnosis not present

## 2013-03-02 DIAGNOSIS — R4701 Aphasia: Secondary | ICD-10-CM | POA: Diagnosis present

## 2013-03-02 DIAGNOSIS — F3289 Other specified depressive episodes: Secondary | ICD-10-CM

## 2013-03-02 DIAGNOSIS — IMO0001 Reserved for inherently not codable concepts without codable children: Secondary | ICD-10-CM

## 2013-03-02 DIAGNOSIS — Z79899 Other long term (current) drug therapy: Secondary | ICD-10-CM | POA: Diagnosis not present

## 2013-03-02 DIAGNOSIS — I2510 Atherosclerotic heart disease of native coronary artery without angina pectoris: Secondary | ICD-10-CM

## 2013-03-02 DIAGNOSIS — R4781 Slurred speech: Secondary | ICD-10-CM

## 2013-03-02 DIAGNOSIS — J42 Unspecified chronic bronchitis: Secondary | ICD-10-CM | POA: Diagnosis present

## 2013-03-02 DIAGNOSIS — E1165 Type 2 diabetes mellitus with hyperglycemia: Secondary | ICD-10-CM | POA: Diagnosis present

## 2013-03-02 DIAGNOSIS — F329 Major depressive disorder, single episode, unspecified: Secondary | ICD-10-CM

## 2013-03-02 DIAGNOSIS — G9389 Other specified disorders of brain: Secondary | ICD-10-CM | POA: Diagnosis not present

## 2013-03-02 DIAGNOSIS — K219 Gastro-esophageal reflux disease without esophagitis: Secondary | ICD-10-CM

## 2013-03-02 DIAGNOSIS — G4733 Obstructive sleep apnea (adult) (pediatric): Secondary | ICD-10-CM | POA: Diagnosis not present

## 2013-03-02 DIAGNOSIS — G40109 Localization-related (focal) (partial) symptomatic epilepsy and epileptic syndromes with simple partial seizures, not intractable, without status epilepticus: Secondary | ICD-10-CM | POA: Diagnosis present

## 2013-03-02 DIAGNOSIS — R1319 Other dysphagia: Secondary | ICD-10-CM

## 2013-03-02 DIAGNOSIS — R262 Difficulty in walking, not elsewhere classified: Secondary | ICD-10-CM | POA: Diagnosis not present

## 2013-03-02 DIAGNOSIS — Z7982 Long term (current) use of aspirin: Secondary | ICD-10-CM

## 2013-03-02 DIAGNOSIS — Z794 Long term (current) use of insulin: Secondary | ICD-10-CM

## 2013-03-02 DIAGNOSIS — G459 Transient cerebral ischemic attack, unspecified: Secondary | ICD-10-CM | POA: Diagnosis not present

## 2013-03-02 DIAGNOSIS — E119 Type 2 diabetes mellitus without complications: Secondary | ICD-10-CM | POA: Diagnosis not present

## 2013-03-02 DIAGNOSIS — J4489 Other specified chronic obstructive pulmonary disease: Secondary | ICD-10-CM | POA: Diagnosis present

## 2013-03-02 DIAGNOSIS — IMO0002 Reserved for concepts with insufficient information to code with codable children: Secondary | ICD-10-CM | POA: Diagnosis present

## 2013-03-02 DIAGNOSIS — I69928 Other speech and language deficits following unspecified cerebrovascular disease: Secondary | ICD-10-CM | POA: Diagnosis not present

## 2013-03-02 DIAGNOSIS — F17200 Nicotine dependence, unspecified, uncomplicated: Secondary | ICD-10-CM

## 2013-03-02 DIAGNOSIS — I779 Disorder of arteries and arterioles, unspecified: Secondary | ICD-10-CM

## 2013-03-02 DIAGNOSIS — I6789 Other cerebrovascular disease: Secondary | ICD-10-CM | POA: Diagnosis not present

## 2013-03-02 DIAGNOSIS — J309 Allergic rhinitis, unspecified: Secondary | ICD-10-CM

## 2013-03-02 DIAGNOSIS — E871 Hypo-osmolality and hyponatremia: Secondary | ICD-10-CM

## 2013-03-02 DIAGNOSIS — I69998 Other sequelae following unspecified cerebrovascular disease: Secondary | ICD-10-CM

## 2013-03-02 DIAGNOSIS — G43909 Migraine, unspecified, not intractable, without status migrainosus: Secondary | ICD-10-CM

## 2013-03-02 DIAGNOSIS — R4789 Other speech disturbances: Secondary | ICD-10-CM | POA: Diagnosis not present

## 2013-03-02 DIAGNOSIS — Z1231 Encounter for screening mammogram for malignant neoplasm of breast: Secondary | ICD-10-CM

## 2013-03-02 LAB — PROTIME-INR
INR: 0.99 (ref 0.00–1.49)
Prothrombin Time: 13 seconds (ref 11.6–15.2)

## 2013-03-02 LAB — CBC
HCT: 37.8 % (ref 36.0–46.0)
Hemoglobin: 12 g/dL (ref 12.0–15.0)
MCH: 22.1 pg — ABNORMAL LOW (ref 26.0–34.0)
MCHC: 31.7 g/dL (ref 30.0–36.0)
MCV: 69.6 fL — ABNORMAL LOW (ref 78.0–100.0)
Platelets: 255 10*3/uL (ref 150–400)
RBC: 5.43 MIL/uL — ABNORMAL HIGH (ref 3.87–5.11)
RDW: 18.3 % — ABNORMAL HIGH (ref 11.5–15.5)
WBC: 6.1 10*3/uL (ref 4.0–10.5)

## 2013-03-02 LAB — APTT: aPTT: 27 seconds (ref 24–37)

## 2013-03-02 NOTE — ED Notes (Signed)
PT. REPORTS SLURRED SPEECH FOR 2 DAYS , PT. STATED HISTORY OF CVA , ALERT AND ORIENTED , NO FACIAL ASYMMETRY , EQUAL GRIPS / NO ARM DRIFT , AMBULATORY , RESPIRATIONS UNLABORED , DENIES PAIN.

## 2013-03-03 ENCOUNTER — Inpatient Hospital Stay (HOSPITAL_COMMUNITY): Payer: Medicare Other

## 2013-03-03 ENCOUNTER — Encounter (HOSPITAL_COMMUNITY): Payer: Self-pay | Admitting: Internal Medicine

## 2013-03-03 DIAGNOSIS — R4789 Other speech disturbances: Secondary | ICD-10-CM

## 2013-03-03 DIAGNOSIS — F17200 Nicotine dependence, unspecified, uncomplicated: Secondary | ICD-10-CM

## 2013-03-03 DIAGNOSIS — F411 Generalized anxiety disorder: Secondary | ICD-10-CM

## 2013-03-03 DIAGNOSIS — I10 Essential (primary) hypertension: Secondary | ICD-10-CM

## 2013-03-03 DIAGNOSIS — I6350 Cerebral infarction due to unspecified occlusion or stenosis of unspecified cerebral artery: Secondary | ICD-10-CM | POA: Diagnosis not present

## 2013-03-03 DIAGNOSIS — R569 Unspecified convulsions: Secondary | ICD-10-CM | POA: Diagnosis present

## 2013-03-03 DIAGNOSIS — E119 Type 2 diabetes mellitus without complications: Secondary | ICD-10-CM

## 2013-03-03 DIAGNOSIS — G9389 Other specified disorders of brain: Secondary | ICD-10-CM | POA: Diagnosis not present

## 2013-03-03 LAB — DIFFERENTIAL
Basophils Absolute: 0.1 10*3/uL (ref 0.0–0.1)
Basophils Relative: 1 % (ref 0–1)
Eosinophils Absolute: 0.1 10*3/uL (ref 0.0–0.7)
Eosinophils Relative: 1 % (ref 0–5)
Lymphocytes Relative: 36 % (ref 12–46)
Lymphs Abs: 2.2 10*3/uL (ref 0.7–4.0)
Monocytes Absolute: 0.5 10*3/uL (ref 0.1–1.0)
Monocytes Relative: 8 % (ref 3–12)
Neutro Abs: 3.2 10*3/uL (ref 1.7–7.7)
Neutrophils Relative %: 54 % (ref 43–77)

## 2013-03-03 LAB — COMPREHENSIVE METABOLIC PANEL
ALT: 18 U/L (ref 0–35)
AST: 18 U/L (ref 0–37)
Albumin: 3.7 g/dL (ref 3.5–5.2)
Alkaline Phosphatase: 111 U/L (ref 39–117)
BUN: 9 mg/dL (ref 6–23)
CO2: 30 mEq/L (ref 19–32)
Calcium: 9.8 mg/dL (ref 8.4–10.5)
Chloride: 93 mEq/L — ABNORMAL LOW (ref 96–112)
Creatinine, Ser: 0.55 mg/dL (ref 0.50–1.10)
GFR calc Af Amer: >90 mL/min (ref >90)
GFR calc non Af Amer: >90 mL/min (ref >90)
Glucose, Bld: 477 mg/dL — ABNORMAL HIGH (ref 70–99)
Potassium: 4.2 mEq/L (ref 3.5–5.1)
Sodium: 133 mEq/L — ABNORMAL LOW (ref 135–145)
Total Bilirubin: 0.2 mg/dL — ABNORMAL LOW (ref 0.3–1.2)
Total Protein: 6.9 g/dL (ref 6.0–8.3)

## 2013-03-03 LAB — GLUCOSE, CAPILLARY
Glucose-Capillary: 308 mg/dL — ABNORMAL HIGH (ref 70–99)
Glucose-Capillary: 302 mg/dL — ABNORMAL HIGH (ref 70–99)
Glucose-Capillary: 426 mg/dL — ABNORMAL HIGH (ref 70–99)
Glucose-Capillary: 277 mg/dL — ABNORMAL HIGH (ref 70–99)

## 2013-03-03 LAB — CBC
HCT: 38.4 % (ref 36.0–46.0)
Hemoglobin: 12.1 g/dL (ref 12.0–15.0)
MCH: 21.8 pg — ABNORMAL LOW (ref 26.0–34.0)
MCHC: 31.5 g/dL (ref 30.0–36.0)
MCV: 69.2 fL — ABNORMAL LOW (ref 78.0–100.0)
Platelets: 239 10*3/uL (ref 150–400)
RBC: 5.55 MIL/uL — ABNORMAL HIGH (ref 3.87–5.11)
RDW: 18 % — ABNORMAL HIGH (ref 11.5–15.5)
WBC: 6.3 10*3/uL (ref 4.0–10.5)

## 2013-03-03 LAB — BASIC METABOLIC PANEL
BUN: 7 mg/dL (ref 6–23)
CO2: 29 mEq/L (ref 19–32)
Calcium: 9.7 mg/dL (ref 8.4–10.5)
Chloride: 94 mEq/L — ABNORMAL LOW (ref 96–112)
Creatinine, Ser: 0.42 mg/dL — ABNORMAL LOW (ref 0.50–1.10)
GFR calc Af Amer: >90 mL/min (ref >90)
GFR calc non Af Amer: >90 mL/min (ref >90)
Glucose, Bld: 273 mg/dL — ABNORMAL HIGH (ref 70–99)
Potassium: 3.3 mEq/L — ABNORMAL LOW (ref 3.5–5.1)
Sodium: 133 mEq/L — ABNORMAL LOW (ref 135–145)

## 2013-03-03 LAB — HEMOGLOBIN A1C
Hgb A1c MFr Bld: 12.8 % — ABNORMAL HIGH (ref <5.7)
Mean Plasma Glucose: 321 mg/dL — ABNORMAL HIGH (ref <117)

## 2013-03-03 LAB — TROPONIN I: Troponin I: <0.3 ng/mL (ref <0.30)

## 2013-03-03 MED ORDER — CVS NATURAL FISH OIL 1200 MG PO CAPS: 1.0000 | ORAL_CAPSULE | Freq: Two times a day (BID) | ORAL | Status: DC

## 2013-03-03 MED ORDER — SODIUM CHLORIDE 0.9 % IV SOLN: INTRAVENOUS | Status: DC

## 2013-03-03 MED ORDER — LORATADINE 10 MG PO TABS
10.0000 mg | ORAL_TABLET | Freq: Every day | ORAL | Status: DC
  Administered 2013-03-03 – 2013-03-06 (×4): 10 mg via ORAL
  Filled 2013-03-03 (×4): qty 1

## 2013-03-03 MED ORDER — INSULIN ASPART 100 UNIT/ML ~~LOC~~ SOLN
0.0000 [IU] | Freq: Three times a day (TID) | SUBCUTANEOUS | Status: DC
  Administered 2013-03-03: 20 [IU] via SUBCUTANEOUS
  Administered 2013-03-03: 11 [IU] via SUBCUTANEOUS
  Administered 2013-03-04: 20 [IU] via SUBCUTANEOUS
  Administered 2013-03-04 – 2013-03-05 (×4): 11 [IU] via SUBCUTANEOUS
  Administered 2013-03-05: 15 [IU] via SUBCUTANEOUS
  Administered 2013-03-06 (×2): 11 [IU] via SUBCUTANEOUS

## 2013-03-03 MED ORDER — SODIUM CHLORIDE 0.9 % IV SOLN
1000.0000 mg | Freq: Once | INTRAVENOUS | Status: AC
  Administered 2013-03-03: 1000 mg via INTRAVENOUS
  Filled 2013-03-03: qty 10

## 2013-03-03 MED ORDER — ALBUTEROL SULFATE HFA 108 (90 BASE) MCG/ACT IN AERS: 2.0000 | INHALATION_SPRAY | Freq: Four times a day (QID) | RESPIRATORY_TRACT | Status: DC | PRN

## 2013-03-03 MED ORDER — ACETAMINOPHEN 650 MG RE SUPP: 650.0000 mg | Freq: Four times a day (QID) | RECTAL | Status: DC | PRN

## 2013-03-03 MED ORDER — WARFARIN SODIUM 3 MG PO TABS: 3.0000 mg | ORAL_TABLET | Freq: Once | ORAL | Status: DC

## 2013-03-03 MED ORDER — ALBUTEROL SULFATE (5 MG/ML) 0.5% IN NEBU: 2.5000 mg | INHALATION_SOLUTION | RESPIRATORY_TRACT | Status: DC | PRN

## 2013-03-03 MED ORDER — LEVETIRACETAM 500 MG PO TABS
500.0000 mg | ORAL_TABLET | Freq: Two times a day (BID) | ORAL | Status: DC
  Administered 2013-03-03 – 2013-03-04 (×3): 500 mg via ORAL
  Filled 2013-03-03 (×4): qty 1

## 2013-03-03 MED ORDER — ONDANSETRON HCL 4 MG PO TABS: 4.0000 mg | ORAL_TABLET | Freq: Four times a day (QID) | ORAL | Status: DC | PRN

## 2013-03-03 MED ORDER — RANOLAZINE ER 500 MG PO TB12
500.0000 mg | ORAL_TABLET | Freq: Two times a day (BID) | ORAL | Status: DC
  Administered 2013-03-03 – 2013-03-06 (×7): 500 mg via ORAL
  Filled 2013-03-03 (×8): qty 1

## 2013-03-03 MED ORDER — CARVEDILOL 12.5 MG PO TABS
12.5000 mg | ORAL_TABLET | Freq: Two times a day (BID) | ORAL | Status: DC
  Administered 2013-03-03 – 2013-03-06 (×7): 12.5 mg via ORAL
  Filled 2013-03-03 (×9): qty 1

## 2013-03-03 MED ORDER — DOCUSATE SODIUM 100 MG PO CAPS
100.0000 mg | ORAL_CAPSULE | Freq: Two times a day (BID) | ORAL | Status: DC
  Administered 2013-03-03 – 2013-03-06 (×7): 100 mg via ORAL
  Filled 2013-03-03 (×8): qty 1

## 2013-03-03 MED ORDER — INSULIN ASPART 100 UNIT/ML ~~LOC~~ SOLN
3.0000 [IU] | Freq: Three times a day (TID) | SUBCUTANEOUS | Status: DC
  Administered 2013-03-03 – 2013-03-06 (×9): 3 [IU] via SUBCUTANEOUS

## 2013-03-03 MED ORDER — CLOPIDOGREL BISULFATE 75 MG PO TABS
75.0000 mg | ORAL_TABLET | Freq: Every day | ORAL | Status: DC
  Administered 2013-03-03 – 2013-03-06 (×4): 75 mg via ORAL
  Filled 2013-03-03 (×4): qty 1

## 2013-03-03 MED ORDER — LOSARTAN POTASSIUM 50 MG PO TABS
100.0000 mg | ORAL_TABLET | Freq: Every day | ORAL | Status: DC
  Administered 2013-03-03 – 2013-03-06 (×4): 100 mg via ORAL
  Filled 2013-03-03 (×4): qty 2

## 2013-03-03 MED ORDER — ACETAMINOPHEN 325 MG PO TABS
650.0000 mg | ORAL_TABLET | Freq: Four times a day (QID) | ORAL | Status: DC | PRN
  Administered 2013-03-03 – 2013-03-06 (×2): 650 mg via ORAL
  Filled 2013-03-03 (×2): qty 2

## 2013-03-03 MED ORDER — CYCLOBENZAPRINE HCL 10 MG PO TABS
10.0000 mg | ORAL_TABLET | Freq: Three times a day (TID) | ORAL | Status: DC | PRN
  Filled 2013-03-03 (×2): qty 1

## 2013-03-03 MED ORDER — POTASSIUM CHLORIDE CRYS ER 10 MEQ PO TBCR
10.0000 meq | EXTENDED_RELEASE_TABLET | Freq: Every day | ORAL | Status: DC
  Administered 2013-03-03 – 2013-03-06 (×4): 10 meq via ORAL
  Filled 2013-03-03 (×4): qty 1

## 2013-03-03 MED ORDER — NITROGLYCERIN 0.4 MG SL SUBL: 0.4000 mg | SUBLINGUAL_TABLET | SUBLINGUAL | Status: DC | PRN

## 2013-03-03 MED ORDER — GUAIFENESIN ER 600 MG PO TB12
1200.0000 mg | ORAL_TABLET | Freq: Two times a day (BID) | ORAL | Status: DC
  Administered 2013-03-03 – 2013-03-06 (×7): 1200 mg via ORAL
  Filled 2013-03-03 (×8): qty 2

## 2013-03-03 MED ORDER — ONDANSETRON HCL 4 MG/2ML IJ SOLN: 4.0000 mg | Freq: Four times a day (QID) | INTRAMUSCULAR | Status: DC | PRN

## 2013-03-03 MED ORDER — ENOXAPARIN SODIUM 40 MG/0.4ML ~~LOC~~ SOLN
40.0000 mg | Freq: Every day | SUBCUTANEOUS | Status: DC
  Administered 2013-03-03 – 2013-03-06 (×2): 40 mg via SUBCUTANEOUS
  Filled 2013-03-03 (×4): qty 0.4

## 2013-03-03 MED ORDER — HYDROCHLOROTHIAZIDE 50 MG PO TABS
50.0000 mg | ORAL_TABLET | Freq: Every day | ORAL | Status: DC
  Administered 2013-03-03 – 2013-03-06 (×4): 50 mg via ORAL
  Filled 2013-03-03 (×4): qty 1

## 2013-03-03 MED ORDER — ATORVASTATIN CALCIUM 20 MG PO TABS
20.0000 mg | ORAL_TABLET | Freq: Every day | ORAL | Status: DC
  Administered 2013-03-03 – 2013-03-05 (×3): 20 mg via ORAL
  Filled 2013-03-03 (×4): qty 1

## 2013-03-03 MED ORDER — SODIUM CHLORIDE 0.9 % IV SOLN
1000.0000 mg | Freq: Two times a day (BID) | INTRAVENOUS | Status: DC
  Administered 2013-03-03: 1000 mg via INTRAVENOUS
  Filled 2013-03-03: qty 10

## 2013-03-03 MED ORDER — SODIUM CHLORIDE 0.9 % IJ SOLN
3.0000 mL | Freq: Two times a day (BID) | INTRAMUSCULAR | Status: DC
  Administered 2013-03-03 – 2013-03-05 (×5): 3 mL via INTRAVENOUS

## 2013-03-03 MED ORDER — ASPIRIN 325 MG PO TABS
325.0000 mg | ORAL_TABLET | Freq: Every day | ORAL | Status: DC
  Administered 2013-03-03 – 2013-03-06 (×4): 325 mg via ORAL
  Filled 2013-03-03 (×4): qty 1

## 2013-03-03 MED ORDER — OMEGA-3-ACID ETHYL ESTERS 1 G PO CAPS
1.0000 g | ORAL_CAPSULE | Freq: Two times a day (BID) | ORAL | Status: DC
  Administered 2013-03-03 – 2013-03-06 (×7): 1 g via ORAL
  Filled 2013-03-03 (×8): qty 1

## 2013-03-03 MED ORDER — PANTOPRAZOLE SODIUM 40 MG PO TBEC
40.0000 mg | DELAYED_RELEASE_TABLET | Freq: Every day | ORAL | Status: DC
  Administered 2013-03-03 – 2013-03-06 (×4): 40 mg via ORAL
  Filled 2013-03-03 (×4): qty 1

## 2013-03-03 MED ORDER — ISOSORBIDE MONONITRATE ER 60 MG PO TB24
60.0000 mg | ORAL_TABLET | Freq: Every day | ORAL | Status: DC
  Administered 2013-03-03 – 2013-03-06 (×4): 60 mg via ORAL
  Filled 2013-03-03 (×4): qty 1

## 2013-03-03 NOTE — Progress Notes (Signed)
Stroke Team Progress Note  HISTORY Diane [name] is an 57 y.o. female with a history of right frontoparietal stroke at age 14, hypertension, hyperlipidemia, diabetes mellitus, coronary artery disease with stent placement, COPD and tobacco abuse who has been experiencing recurrent spells of staring and inability to speak coherently about 2 weeks with a marked increase in frequency of spells over the past 2 days. Patient is aware of it is being said to her and knows what she wants to say but can only make guttural type sounds for about 1 minute in speech returns. Pattern is same with all of her spells. She is noted to stare straight ahead during the spells. She's had no focal weakness nor numbness. CT scan of her head showed encephalomalacia involving the right frontoparietal region but no acute intracranial abnormality. Patient's been taking aspirin and Plavix daily. She also has been taking Neurontin which she says was given to her for fibromyalgia. She reduced Neurontin from 1 capsule in the morning and 2 capsules at night to one capsule orally at night starting 2 weeks ago. Strength of the Neurontin capsules is unknown.   SUBJECTIVE Her sister is at the bedside.  She is currently have once of her spells - she cannot speak or follow commands, she is grunting. Will move extremities when touched and follow people entering room. Sister reports she has had greater than a few spells since her arrival. During a spell in the ED she was able to write "perfectly" what she wanted to say.. Overall she feels her condition is gradually worsening.   OBJECTIVE Most recent Vital Signs: Filed Vitals:   03/03/13 0400 03/03/13 0531 03/03/13 0751 03/03/13 0944  BP: 166/89 175/82 137/86 145/75  Pulse: 93 95 87 81  Temp:  98.5 F (36.9 C) 98.7 F (37.1 C)   TempSrc:   Oral   Resp: 12 18    Height:  4\' 10"  (1.473 m)    Weight:  67.5 kg (148 lb 13 oz)    SpO2: 94% 95% 94%    CBG (last 3)   Recent Labs   03/03/13 0420 03/03/13 0754  GLUCAP 308* 302*    IV Fluid Intake:   . sodium chloride      MEDICATIONS  . aspirin  325 mg Oral Daily  . atorvastatin  20 mg Oral q1800  . carvedilol  12.5 mg Oral BID WC  . clopidogrel  75 mg Oral Q breakfast  . docusate sodium  100 mg Oral BID  . enoxaparin (LOVENOX) injection  40 mg Subcutaneous Daily  . guaiFENesin  1,200 mg Oral BID  . hydrochlorothiazide  50 mg Oral Daily  . isosorbide mononitrate  60 mg Oral Daily  . levETIRAcetam  500 mg Oral BID  . loratadine  10 mg Oral Daily  . losartan  100 mg Oral Daily  . omega-3 acid ethyl esters  1 g Oral BID  . pantoprazole  40 mg Oral Daily  . potassium chloride  10 mEq Oral Daily  . ranolazine  500 mg Oral BID  . sodium chloride  3 mL Intravenous Q12H   PRN:  acetaminophen, acetaminophen, albuterol, albuterol, cyclobenzaprine, nitroGLYCERIN, ondansetron (ZOFRAN) IV, ondansetron  Diet:  Cardiac thin liquids Activity: Up with assistance DVT Prophylaxis:  Lovenox 40 mg sq daily   CLINICALLY SIGNIFICANT STUDIES Basic Metabolic Panel:  Recent Labs Lab 03/02/13 2313 03/03/13 0535  NA 133* 133*  K 4.2 3.3*  CL 93* 94*  CO2 30 29  GLUCOSE 477*  273*  BUN 9 7  CREATININE 0.55 0.42*  CALCIUM 9.8 9.7   Liver Function Tests:  Recent Labs Lab 03/02/13 2313  AST 18  ALT 18  ALKPHOS 111  BILITOT 0.2*  PROT 6.9  ALBUMIN 3.7   CBC:  Recent Labs Lab 03/02/13 2313 03/03/13 0535  WBC 6.1 6.3  NEUTROABS 3.2  --   HGB 12.0 12.1  HCT 37.8 38.4  MCV 69.6* 69.2*  PLT 255 239   Coagulation:  Recent Labs Lab 03/02/13 2313  LABPROT 13.0  INR 0.99   Cardiac Enzymes:  Recent Labs Lab 03/02/13 2313  TROPONINI <0.30   Urinalysis: No results found for this basename: COLORURINE, APPERANCEUR, LABSPEC, PHURINE, GLUCOSEU, HGBUR, BILIRUBINUR, KETONESUR, PROTEINUR, UROBILINOGEN, NITRITE, LEUKOCYTESUR,  in the last 168 hours Lipid Panel    Component Value Date/Time   CHOL 111 12/12/2012  1300   TRIG 207.0* 12/12/2012 1300   HDL 33.50* 12/12/2012 1300   CHOLHDL 3 12/12/2012 1300   VLDL 41.4* 12/12/2012 1300   LDLCALC 46 06/13/2011 1125   HgbA1C  Lab Results  Component Value Date   HGBA1C 7.4* 06/06/2012    Urine Drug Screen:   No results found for this basename: labopia, cocainscrnur, labbenz, amphetmu, thcu, labbarb    Alcohol Level: No results found for this basename: ETH,  in the last 168 hours  CT of the brain  03/03/2013   1.  No acute intracranial abnormalities. 2.  Encephalomalacia in the right frontoparietal region related to prior infarction. 3.  Mild chronic microvascular ischemic disease of the periventricular white matter of the cerebral hemispheres bilaterally.    MRI of the brain  03/03/2013    Tiny acute non hemorrhagic posterior left frontal lobe white matter infarct which is weakly DWI positive but not dark on ADC map-probably represents unrelated tiny lacunar silent infarct from small vessel disease    EEG   ASSESSMENT [name] is a 57 y.o. female presenting with aphasia "spells" (cannot speak or follow commands) during which she is responsive and can write notes, no aura. Multiple spells, > 10 since admission and last onli 1-2 minutes likely nonorganic speech distrubances- doubt left hemispheric TIAs.. She dose have a history of depression and is followed by psych as an outpatient. Seizure-like spell likely of non-organic etiology. No acute stroke. MRI show subacute left corona radiata abnormality that is an incidental finding.[name]  Hospital day # 1  TREATMENT/PLAN  keppra 1000 mg IV now. Continue 500 mg po bid  EEG. Leave on leads to catch a "spell"  Stroke team will sign off  Will return patient to [facility] for followup  Annie Main, MSN, RN, ANVP-BC, ANP-BC, GNP-BC [facility] Pager: [phone] 03/03/2013 9:45 AM  I have personally obtained a history, examined the patient, evaluated imaging results, and  formulated the assessment and plan of care. I agree with the above. Delia Heady, MD

## 2013-03-03 NOTE — ED Provider Notes (Signed)
History     CSN: [id number]  Arrival date & time 03/02/13  2250   First MD Initiated Contact with Patient 03/03/13 [phone]      Chief Complaint  Patient presents with  . Aphasia    (Consider location/radiation/quality/duration/timing/severity/associated sxs/prior treatment) HPI 57 yo female presents to the ER from home accompanied by family.  She reports 2 weeks of racing thoughts, difficulties sleeping.  Over the last two days she has had intermittent episodes of slurred speech.  Family also reports episodes where she is unresponsive, makes odd sounds.  Pt with h/o stroke in 27, reports residual effects of issues of balance and memory.  No focal weakness, numbness today.  Pt reports normal ADLS.  She was not interested in coming to the ER, family had to bring her here.  Past Medical History  Diagnosis Date  . Coronary artery disease     post CABG in 3/07   . Hypertension   . Hyperlipidemia   . Dyslipidemia   . Diabetes mellitus     type 2  . Chronic bronchitis   . CVA (cerebral infarction) 1993  . Tobacco abuse   . COPD (chronic obstructive pulmonary disease)   . Stroke 1993    Past Surgical History  Procedure Laterality Date  . Coronary stent placement  08/11/06    PCI of her ciurcumflex/OM vessel  . Cardiac catheterization  11/29/05    EF of 55%  . Cardiac catheterization  08/06/06    EF of 45-50%  . Coronary artery bypass graft  12/04/2005    x5 -- left internal mammary artery to the LAD, left radial artery to the ramus intermedius, saphenous vein graft to the obtuse marginal 1, sequential saphenous vein grat to the acute marginal and posterior descending, endoscopic vein harvesting from the left thigh with open vein harvest from right leg  . Heart bypass  2007    Family History  Problem Relation Age of Onset  . Coronary artery disease    . Heart disease    . Heart disease Mother   . Diabetes Mother   . COPD Mother   . Hyperlipidemia Mother   . Hypertension Mother    . Cancer Father   . Drug abuse Paternal Grandmother   . Stroke Paternal Grandfather     History  Substance Use Topics  . Smoking status: Current Some Day Smoker -- 2.00 packs/day for 36 years    Types: Cigarettes  . Smokeless tobacco: Never Used     Comment: 1.5-2 ppd  . Alcohol Use: 0.5 oz/week    1 drink(s) per week     Comment: occ    OB History   Grav Para Term Preterm Abortions TAB SAB Ect Mult Living   1               Review of Systems  All other systems reviewed and are negative.    Allergies  Ace inhibitors; Eggs or egg-derived products; and Tetracycline  Home Medications  No current outpatient prescriptions on file.  BP 175/82  Pulse 95  Temp(Src) 98.5 F (36.9 C) (Oral)  Resp 18  Ht 4\' 10"  (1.473 m)  Wt 148 lb 13 oz (67.5 kg)  BMI 31.11 kg/m2  SpO2 95%  LMP 09/02/2003  Physical Exam  Nursing note and vitals reviewed. Constitutional: She is oriented to person, place, and time. She appears well-developed and well-nourished. No distress.  HENT:  Head: Normocephalic and atraumatic.  Nose: Nose normal.  Mouth/Throat: Oropharynx is  clear and moist.  Eyes: Conjunctivae and EOM are normal. Pupils are equal, round, and reactive to light.  Neck: Normal range of motion. Neck supple. No JVD present. No tracheal deviation present. No thyromegaly present.  Cardiovascular: Normal rate, regular rhythm, normal heart sounds and intact distal pulses.  Exam reveals no gallop and no friction rub.   No murmur heard. Pulmonary/Chest: Effort normal and breath sounds normal. No stridor. No respiratory distress. She has no wheezes. She has no rales. She exhibits no tenderness.  Abdominal: Soft. Bowel sounds are normal. She exhibits no distension and no mass. There is no tenderness. There is no rebound and no guarding.  Musculoskeletal: Normal range of motion. She exhibits no edema and no tenderness.  Lymphadenopathy:    She has no cervical adenopathy.  Neurological: She  is alert and oriented to person, place, and time. She has normal reflexes. No cranial nerve deficit. She exhibits normal muscle tone. Coordination normal.  Slurred speech, episode of gutteral sounds, looking to the right, unresponsive.  Lasting about 1 minute with 1 minute of recovery time  Skin: Skin is warm and dry. No rash noted. No erythema. No pallor.  Psychiatric: She has a normal mood and affect. Her behavior is normal. Judgment and thought content normal.    ED Course  Procedures (including critical care time)  Labs Reviewed  CBC - Abnormal; Notable for the following:    RBC 5.43 (*)    MCV 69.6 (*)    MCH 22.1 (*)    RDW 18.3 (*)    All other components within normal limits  COMPREHENSIVE METABOLIC PANEL - Abnormal; Notable for the following:    Sodium 133 (*)    Chloride 93 (*)    Glucose, Bld 477 (*)    Total Bilirubin 0.2 (*)    All other components within normal limits  GLUCOSE, CAPILLARY - Abnormal; Notable for the following:    Glucose-Capillary 308 (*)    All other components within normal limits  BASIC METABOLIC PANEL - Abnormal; Notable for the following:    Sodium 133 (*)    Potassium 3.3 (*)    Chloride 94 (*)    Glucose, Bld 273 (*)    Creatinine, Ser 0.42 (*)    All other components within normal limits  CBC - Abnormal; Notable for the following:    RBC 5.55 (*)    MCV 69.2 (*)    MCH 21.8 (*)    RDW 18.0 (*)    All other components within normal limits  PROTIME-INR  APTT  DIFFERENTIAL  TROPONIN I  HEMOGLOBIN A1C   Ct Head (brain) Wo Contrast  03/03/2013   *RADIOLOGY REPORT*  Clinical Data: Slurred speech.  Right-sided facial droop.  CT HEAD WITHOUT CONTRAST  Technique:  Contiguous axial images were obtained from the base of the skull through the vertex without contrast.  Comparison: Head CT 11/02/2010.  Findings: Well defined area of decreased attenuation in the right frontoparietal region is unchanged, compatible with encephalomalacia.  No acute  intracranial abnormality. Specifically, no definite signs of acute/subacute cerebral ischemia, no evidence of acute intracranial hemorrhage, no mass, mass effect or hydrocephalous.  Mild decreased attenuation in the periventricular white matter of the cerebral hemispheres bilaterally, compatible with mild chronic microvascular ischemic disease.  No acute displaced skull fractures are identified. Visualized paranasal sinuses and mastoids are well pneumatized.  IMPRESSION: 1.  No acute intracranial abnormalities. 2.  Encephalomalacia in the right frontoparietal region related to prior infarction. 3.  Mild chronic microvascular ischemic disease of the periventricular white matter of the cerebral hemispheres bilaterally.   Original Report Authenticated By: Trudie Reed, M.D.     1. Slurred speech   2. Partial seizure   3. Partial seizures   4. Type II or unspecified type diabetes mellitus without mention of complication, not stated as uncontrolled   5. Unspecified essential hypertension   6. Tobacco use disorder       MDM  40-year-old female with history of CVA, presents with 2 days of slurred speech, and 2 weeks of racing thoughts.  Patient has had several episodes here in the emergency room lasting less than 5 minutes of what appears to be partial seizures.  She's been seen by neurology, who recommends Keppra.  Discussed with hospitalist for admission.        Olivia Mackie, MD 03/04/13 [phone]

## 2013-03-03 NOTE — H&P (Addendum)
[facility] History and Physical  Diane [name] RUE:[id number] DOB: 14-Jul-1956 DOA: 03/02/2013  Referring physician: ER physician. PCP: Diane Manns, MD  Chief Complaint: Seizure.  HPI: Diane [name] is a 57 y.o. female was brought to the ER after patient was witnessed to have increasing staring spells at home. Patient had similar episodes in the ER twice once witnessed by the ER physician. As per the ER physician patient was staring towards the right side for a few seconds. Patient did not lose consciousness after the incident. CT head did not show anything acute and patient did not have any acute focal deficits. Neurologist on-call Dr. Roseanne Reno was consulted and at this time patient was started on Keppra for possible partial seizures. Patient otherwise denies any headache nausea vomiting abdominal pain chest pain or shortness of breath.  Review of Systems: As presented in the history of presenting illness, rest negative.  Past Medical History  Diagnosis Date  . Coronary artery disease     post CABG in 3/07   . Hypertension   . Hyperlipidemia   . Dyslipidemia   . Diabetes mellitus     type 2  . Chronic bronchitis   . CVA (cerebral infarction) 1993  . Tobacco abuse   . COPD (chronic obstructive pulmonary disease)   . Stroke 1993   Past Surgical History  Procedure Laterality Date  . Coronary stent placement  08/11/06    PCI of her ciurcumflex/OM vessel  . Cardiac catheterization  11/29/05    EF of 55%  . Cardiac catheterization  08/06/06    EF of 45-50%  . Coronary artery bypass graft  12/04/2005    x5 -- left internal mammary artery to the LAD, left radial artery to the ramus intermedius, saphenous vein graft to the obtuse marginal 1, sequential saphenous vein grat to the acute marginal and posterior descending, endoscopic vein harvesting from the left thigh with open vein harvest from right leg  . Heart bypass  2007   Social History:  reports that she has been smoking  Cigarettes.  She has a 72 pack-year smoking history. She has never used smokeless tobacco. She reports that she drinks about 0.5 ounces of alcohol per week. She reports that she does not use illicit drugs. Lives at home. where does patient live-- Can do ADLs. Can patient participate in ADLs?  Allergies  Allergen Reactions  . Ace Inhibitors     REACTION: cough  . Eggs Or Egg-Derived Products     Abdominal discomfort-no allergy She can get a flu shot   . Tetracycline     REACTION: reaction not known    Family History  Problem Relation Age of Onset  . Coronary artery disease    . Heart disease    . Heart disease Mother   . Diabetes Mother   . COPD Mother   . Hyperlipidemia Mother   . Hypertension Mother   . Cancer Father   . Drug abuse Paternal Grandmother   . Stroke Paternal Grandfather       Prior to Admission medications   Medication Sig Start Date End Date Taking? Authorizing Provider  albuterol (PROAIR HFA) 108 (90 BASE) MCG/ACT inhaler Inhale 2 puffs into the lungs every 6 (six) hours as needed for wheezing. 04/02/12 04/02/13 Yes Coralyn Helling, MD  albuterol (PROVENTIL) (2.5 MG/3ML) 0.083% nebulizer solution Take 3 mLs (2.5 mg total) by nebulization every 6 (six) hours as needed. 08/26/12  Yes Coralyn Helling, MD  aspirin 325 MG tablet Take  1 tablet (325 mg total) by mouth daily. 08/13/12  Yes Judy Pimple, MD  carvedilol (COREG) 12.5 MG tablet Take 12.5 mg by mouth 2 (two) times daily with a meal.   Yes Historical Provider, MD  cetirizine (ZYRTEC) 10 MG tablet Take 1 tablet (10 mg total) by mouth daily. 08/13/12  Yes Judy Pimple, MD  clopidogrel (PLAVIX) 75 MG tablet Take 75 mg by mouth daily.   Yes Historical Provider, MD  cyclobenzaprine (FLEXERIL) 10 MG tablet Take 10 mg by mouth as needed.     Yes Historical Provider, MD  dexlansoprazole (DEXILANT) 60 MG capsule Take 1 capsule (60 mg total) by mouth daily. 06/13/12  Yes Judy Pimple, MD  docusate sodium (COLACE) 100 MG capsule  Take 1 capsule (100 mg total) by mouth 2 (two) times daily. 08/13/12 08/13/13 Yes Judy Pimple, MD  guaiFENesin (MUCINEX) 600 MG 12 hr tablet Take 2 tablets (1,200 mg total) by mouth 2 (two) times daily. 08/13/12  Yes Judy Pimple, MD  hydrochlorothiazide (HYDRODIURIL) 50 MG tablet Take 50 mg by mouth daily.   Yes Historical Provider, MD  isosorbide mononitrate (IMDUR) 60 MG 24 hr tablet TAKE 1 TABLET (60MG  TOTAL) BY MOUTH TWICE A DAY 10/17/12  Yes Peter M [place], MD  KLOR-CON M10 10 MEQ tablet TAKE 1 TABLET TWICE DAILY 11/14/12  Yes Judy Pimple, MD  losartan (COZAAR) 100 MG tablet Take 1 tablet (100 mg total) by mouth daily. 10/03/12 10/03/13 Yes Peter M [place], MD  nitroGLYCERIN (NITROSTAT) 0.4 MG SL tablet Place 1 tablet (0.4 mg total) under the tongue every 5 (five) minutes as needed. 04/20/11  Yes Peter M [place], MD  Omega-3 Fatty Acids ([employer] NATURAL FISH OIL) 1200 MG CAPS Take 1 capsule by mouth 2 (two) times daily. 08/13/12  Yes Judy Pimple, MD  ranolazine (RANEXA) 500 MG 12 hr tablet Take 500 mg by mouth 2 (two) times daily.   Yes Historical Provider, MD  rosuvastatin (CRESTOR) 10 MG tablet Take 10 mg by mouth daily.   Yes Historical Provider, MD  sodium chloride ([employer]S SALINE NASAL SPRAY) 0.65 % nasal spray Place 1 spray into the nose as needed for congestion. 08/06/12  Yes Coralyn Helling, MD  traMADol (ULTRAM) 50 MG tablet Take 50 mg by mouth 2 (two) times daily.     Yes Historical Provider, MD   Physical Exam: Filed Vitals:   03/03/13 0300 03/03/13 0315 03/03/13 0330 03/03/13 0400  BP: 179/92 166/94 174/100 166/89  Pulse: 94 94 97 93  Temp:      TempSrc:      Resp: 13 12 17 12   SpO2: 94% 93% 93% 94%     General:  Well-developed and nourished.  Eyes: Anicteric no pallor.  ENT: No discharge from ears eyes nose mouth.  Neck: No mass felt.  Cardiovascular: S1-S2 heard.  Respiratory: No rhonchi or crepitations.  Abdomen: Soft nontender bowel sounds present.  Skin: No  rash.  Musculoskeletal: No edema.  Psychiatric: Appears normal.  Neurologic: Alert awake oriented to time place and person. Moves all extremities.  Labs on Admission:  Basic Metabolic Panel:  Recent Labs Lab 03/02/13 2313  NA 133*  K 4.2  CL 93*  CO2 30  GLUCOSE 477*  BUN 9  CREATININE 0.55  CALCIUM 9.8   Liver Function Tests:  Recent Labs Lab 03/02/13 2313  AST 18  ALT 18  ALKPHOS 111  BILITOT 0.2*  PROT 6.9  ALBUMIN 3.7   No  results found for this basename: LIPASE, AMYLASE,  in the last 168 hours No results found for this basename: AMMONIA,  in the last 168 hours CBC:  Recent Labs Lab 03/02/13 2313  WBC 6.1  NEUTROABS 3.2  HGB 12.0  HCT 37.8  MCV 69.6*  PLT 255   Cardiac Enzymes:  Recent Labs Lab 03/02/13 2313  TROPONINI <0.30    BNP (last 3 results) No results found for this basename: PROBNP,  in the last 8760 hours CBG:  Recent Labs Lab 03/03/13 0420  GLUCAP 308*    Radiological Exams on Admission: Ct Head (brain) Wo Contrast  03/03/2013   *RADIOLOGY REPORT*  Clinical Data: Slurred speech.  Right-sided facial droop.  CT HEAD WITHOUT CONTRAST  Technique:  Contiguous axial images were obtained from the base of the skull through the vertex without contrast.  Comparison: Head CT 11/02/2010.  Findings: Well defined area of decreased attenuation in the right frontoparietal region is unchanged, compatible with encephalomalacia.  No acute intracranial abnormality. Specifically, no definite signs of acute/subacute cerebral ischemia, no evidence of acute intracranial hemorrhage, no mass, mass effect or hydrocephalous.  Mild decreased attenuation in the periventricular white matter of the cerebral hemispheres bilaterally, compatible with mild chronic microvascular ischemic disease.  No acute displaced skull fractures are identified. Visualized paranasal sinuses and mastoids are well pneumatized.  IMPRESSION: 1.  No acute intracranial abnormalities. 2.   Encephalomalacia in the right frontoparietal region related to prior infarction. 3.  Mild chronic microvascular ischemic disease of the periventricular white matter of the cerebral hemispheres bilaterally.   Original Report Authenticated By: Trudie Reed, M.D.    Assessment/Plan Principal Problem:   Partial seizures Active Problems:   DIABETES MELLITUS, TYPE II   TOBACCO USE   OBSTRUCTIVE SLEEP APNEA   HYPERTENSION   CORONARY ARTERY DISEASE   CVA   1. Possible partial seizures - patient has been started on Keppra IV loading dose followed by oral. EEG and MRI brain ordered as per the neurology request. 2. Uncontrolled diabetes mellitus type 2 but not in DKA - check hemoglobin A1c. Not sure if patient was complaint with her medications. I did not see any home medications for diabetes in her list. Repeat CBG is still high will give dose of long-acting insulin. 3. Tobacco abuse - strongly advised to quit smoking. 4. CAD status post CABG and stenting - denies any chest pain. 5. History of CVA - continue antiplatelet agents. 6. Hypertension - continue home medications. 7. History of OSA - CPAP per respiratory.    Code Status: Full code.  Family Communication: None.  Disposition Plan: Admit to inpatient.    Devika Dragovich N. [facility] Pager [phone].  If 7PM-7AM, please contact night-coverage [email or web address] Password [facility] 03/03/2013, 4:43 AM

## 2013-03-03 NOTE — Consult Note (Signed)
NEURO HOSPITALIST CONSULT NOTE    Reason for Consult: Recurrent spells of staring and inability to speak coherently.  HPI:                                                                                                                                          JAMILEX BOHNSACK is an 57 y.o. female with a history of right frontoparietal stroke at age 83, hypertension, hyperlipidemia, diabetes mellitus, coronary artery disease with stent placement, COPD and tobacco abuse who has been experiencing recurrent spells of staring and inability to speak coherently about 2 weeks with a marked increase in frequency of spells over the past 2 days. Patient is aware of it is being said to her and knows what she wants to say but can only make guttural type sounds for about 1 minute in speech returns. Pattern is same with all of her spells. She is noted to stare straight ahead during the spells. She's had no focal weakness nor numbness. CT scan of her head showed encephalomalacia involving the right frontoparietal region but no acute intracranial abnormality. Patient's been taking aspirin and Plavix daily. She also has been taking Neurontin which she says was given to her for fibromyalgia. She reduced Neurontin from 1 capsule in the morning and 2 capsules at night to one capsule orally at night starting 2 weeks ago. Strength of the Neurontin capsules is unknown.  Past Medical History  Diagnosis Date  . Coronary artery disease     post CABG in 3/07   . Hypertension   . Hyperlipidemia   . Dyslipidemia   . Diabetes mellitus     type 2  . Chronic bronchitis   . CVA (cerebral infarction) 1993  . Tobacco abuse   . COPD (chronic obstructive pulmonary disease)   . Stroke 1993    Past Surgical History  Procedure Laterality Date  . Coronary stent placement  08/11/06    PCI of her ciurcumflex/OM vessel  . Cardiac catheterization  11/29/05    EF of 55%  . Cardiac catheterization  08/06/06    EF of  45-50%  . Coronary artery bypass graft  12/04/2005    x5 -- left internal mammary artery to the LAD, left radial artery to the ramus intermedius, saphenous vein graft to the obtuse marginal 1, sequential saphenous vein grat to the acute marginal and posterior descending, endoscopic vein harvesting from the left thigh with open vein harvest from right leg  . Heart bypass  2007    Family History  Problem Relation Age of Onset  . Coronary artery disease    . Heart disease    . Heart disease Mother   . Diabetes Mother   . COPD Mother   . Hyperlipidemia Mother   . Hypertension  Mother   . Cancer Father   . Drug abuse Paternal Grandmother   . Stroke Paternal Grandfather     Social History:  reports that she has been smoking Cigarettes.  She has a 72 pack-year smoking history. She has never used smokeless tobacco. She reports that she drinks about 0.5 ounces of alcohol per week. She reports that she does not use illicit drugs.  Allergies  Allergen Reactions  . Ace Inhibitors     REACTION: cough  . Eggs Or Egg-Derived Products     Abdominal discomfort-no allergy She can get a flu shot   . Tetracycline     REACTION: reaction not known    MEDICATIONS:                                                                                                                     I have reviewed the patient's current medications. Prior to Admission:   albuterol (PROAIR HFA) 108 (90 BASE) MCG/ACT inhaler  Inhale 2 puffs into the lungs every 6 (six) hours as needed for wheezing.  1 Inhaler  3   .  albuterol (PROVENTIL) (2.5 MG/3ML) 0.083% nebulizer solution  Take 3 mLs (2.5 mg total) by nebulization every 6 (six) hours as needed.  360 mL  3   .  aspirin 325 MG tablet  Take 1 tablet (325 mg total) by mouth daily.  90 tablet  0   .  carvedilol (COREG) 12.5 MG tablet  TAKE 1 TABLET BY MOUTH TWICE DAILY  180 tablet  0   .  cetirizine (ZYRTEC) 10 MG tablet  Take 1 tablet (10 mg total) by mouth daily.  90 tablet   0   .  clopidogrel (PLAVIX) 75 MG tablet  TAKE ONE TABLET BY MOUTH EVERY DAY  90 tablet  1   .  CRESTOR 10 MG tablet  TAKE 1 TABLET DAILY  90 tablet  0   .  cyclobenzaprine (FLEXERIL) 10 MG tablet  Take 10 mg by mouth as needed.     [name]  dexlansoprazole (DEXILANT) 60 MG capsule  Take 1 capsule (60 mg total) by mouth daily.  30 capsule  11   .  docusate sodium (COLACE) 100 MG capsule  Take 1 capsule (100 mg total) by mouth 2 (two) times daily.  180 capsule  0   .  glimepiride (AMARYL) 4 MG tablet  2 tablets by mouth in AM and 1 tablet by mouth in PM.     .  guaiFENesin (MUCINEX) 600 MG 12 hr tablet  Take 2 tablets (1,200 mg total) by mouth 2 (two) times daily.  360 tablet  0   .  hydrochlorothiazide (HYDRODIURIL) 50 MG tablet  TAKE ONE TABLET BY MOUTH EVERY DAY  90 tablet  0   .  isosorbide mononitrate (IMDUR) 60 MG 24 hr tablet  TAKE 1 TABLET (60MG  TOTAL) BY MOUTH TWICE A DAY  180 tablet  0   .  KLOR-CON M10 10 MEQ tablet  TAKE 1 TABLET TWICE DAILY  180 tablet  1   .  losartan (COZAAR) 100 MG tablet  Take 1 tablet (100 mg total) by mouth daily.  90 tablet  3   .  Mometasone Furo-Formoterol Fum 200-5 MCG/ACT AERO  Inhale 2 puffs into the lungs 2 (two) times daily.  1 Inhaler  0   .  nitroGLYCERIN (NITROSTAT) 0.4 MG SL tablet  Place 1 tablet (0.4 mg total) under the tongue every 5 (five) minutes as needed.  25 tablet  11   .  Omega-3 Fatty Acids ([employer] NATURAL FISH OIL) 1200 MG CAPS  Take 1 capsule by mouth 2 (two) times daily.  180 capsule  0   .  RANEXA 500 MG 12 hr tablet  TAKE 1 TABLET BY MOUTH TWICE A DAY  180 tablet  1   .  sitaGLIPtan-metformin (JANUMET) 50-1000 MG per tablet  Take 1 tablet by mouth 2 (two) times daily with a meal.     .  sodium chloride ([employer]S SALINE NASAL SPRAY) 0.65 % nasal spray  Place 1 spray into the nose as needed for congestion.  30 mL  12   .  traMADol (ULTRAM) 50 MG tablet  Take 50 mg by mouth 2 (two) times daily.        ROS:                                                                                                                                        History obtained from the patient  General ROS: negative for - chills, fatigue, fever, night sweats, weight gain or weight loss Psychological ROS: negative for - behavioral disorder, hallucinations, memory difficulties, mood swings or suicidal ideation Ophthalmic ROS: negative for - blurry vision, double vision, eye pain or loss of vision ENT ROS: negative for - epistaxis, nasal discharge, oral lesions, sore throat, tinnitus or vertigo Allergy and Immunology ROS: negative for - hives or itchy/watery eyes Hematological and Lymphatic ROS: negative for - bleeding problems, bruising or swollen lymph nodes Endocrine ROS: negative for - galactorrhea, hair pattern changes, polydipsia/polyuria or temperature intolerance Respiratory ROS: negative for - cough, hemoptysis, shortness of breath or wheezing Cardiovascular ROS: negative for - chest pain, dyspnea on exertion, edema or irregular heartbeat Gastrointestinal ROS: negative for - abdominal pain, diarrhea, hematemesis, nausea/vomiting or stool incontinence Genito-Urinary ROS: negative for - dysuria, hematuria, incontinence or urinary frequency/urgency Musculoskeletal ROS: negative for - joint swelling or muscular weakness Neurological ROS: as noted in HPI Dermatological ROS: negative for rash and skin lesion changes   Blood pressure 159/103, pulse 95, temperature 98.2 F (36.8 C), temperature source Oral, resp. rate 14, last menstrual period 09/02/2003, SpO2 94.00%.   Neurologic Examination:  Mental Status: Alert, oriented, thought content appropriate.  Speech fluent without evidence of aphasia. Able to follow commands without difficulty. Patient experienced 2 episodes of sudden inability to speak with guttural type sounds and staring straight ahead with  associated flexion of her left upper extremity in extension of the index finger and what appeared to be an attempt to express herself. Spells lasted for about 1 minute afterwards patient was slowly able to speak again coherently. During a spell she was able to follow simple commands with hand grip but could not track visually. Cranial Nerves: II-Visual fields were normal. III/IV/VI-Pupils were equal and reacted. Extraocular movements were full and conjugate.    V/VII-no facial numbness and no facial weakness. VIII-normal. X-normal speech and symmetrical palatal movement. Motor: 5/5 bilaterally with normal tone and bulk Sensory: Normal throughout. Deep Tendon Reflexes: Extensor on the left and flexor on the right. Plantars: Flexor bilaterally Cerebellar: Normal finger-to-nose testing. Carotid auscultation: Normal  Lab Results  Component Value Date/Time   CHOL 111 12/12/2012  1:00 PM    Results for orders placed during the hospital encounter of 03/02/13 (from the past 48 hour(s))  PROTIME-INR     Status: None   Collection Time    03/02/13 11:13 PM      Result Value Range   Prothrombin Time 13.0  11.6 - 15.2 seconds   INR 0.99  0.00 - 1.49  APTT     Status: None   Collection Time    03/02/13 11:13 PM      Result Value Range   aPTT 27  24 - 37 seconds  CBC     Status: Abnormal   Collection Time    03/02/13 11:13 PM      Result Value Range   WBC 6.1  4.0 - 10.5 K/uL   RBC 5.43 (*) 3.87 - 5.11 MIL/uL   Hemoglobin 12.0  12.0 - 15.0 g/dL   HCT 16.1  09.6 - 04.5 %   MCV 69.6 (*) 78.0 - 100.0 fL   MCH 22.1 (*) 26.0 - 34.0 pg   MCHC 31.7  30.0 - 36.0 g/dL   RDW 40.9 (*) 81.1 - 91.4 %   Platelets 255  150 - 400 K/uL  DIFFERENTIAL     Status: None   Collection Time    03/02/13 11:13 PM      Result Value Range   Neutrophils Relative % 54  43 - 77 %   Lymphocytes Relative 36  12 - 46 %   Monocytes Relative 8  3 - 12 %   Eosinophils Relative 1  0 - 5 %   Basophils Relative 1  0 - 1  %   Neutro Abs 3.2  1.7 - 7.7 K/uL   Lymphs Abs 2.2  0.7 - 4.0 K/uL   Monocytes Absolute 0.5  0.1 - 1.0 K/uL   Eosinophils Absolute 0.1  0.0 - 0.7 K/uL   Basophils Absolute 0.1  0.0 - 0.1 K/uL   RBC Morphology POLYCHROMASIA PRESENT    COMPREHENSIVE METABOLIC PANEL     Status: Abnormal   Collection Time    03/02/13 11:13 PM      Result Value Range   Sodium 133 (*) 135 - 145 mEq/L   Potassium 4.2  3.5 - 5.1 mEq/L   Chloride 93 (*) 96 - 112 mEq/L   CO2 30  19 - 32 mEq/L   Glucose, Bld 477 (*) 70 - 99 mg/dL   BUN 9  6 - 23 mg/dL  Creatinine, Ser 0.55  0.50 - 1.10 mg/dL   Calcium 9.8  8.4 - 45.4 mg/dL   Total Protein 6.9  6.0 - 8.3 g/dL   Albumin 3.7  3.5 - 5.2 g/dL   AST 18  0 - 37 U/L   ALT 18  0 - 35 U/L   Alkaline Phosphatase 111  39 - 117 U/L   Total Bilirubin 0.2 (*) 0.3 - 1.2 mg/dL   GFR calc non Af Amer >90  >90 mL/min   GFR calc Af Amer >90  >90 mL/min   Comment:            The eGFR has been calculated     using the CKD EPI equation.     This calculation has not been     validated in all clinical     situations.     eGFR's persistently     <90 mL/min signify     possible Chronic Kidney Disease.  TROPONIN I     Status: None   Collection Time    03/02/13 11:13 PM      Result Value Range   Troponin I <0.30  <0.30 ng/mL   Comment:            Due to the release kinetics of cTnI,     a negative result within the first hours     of the onset of symptoms does not rule out     myocardial infarction with certainty.     If myocardial infarction is still suspected,     repeat the test at appropriate intervals.    Ct Head (brain) Wo Contrast  03/03/2013   *RADIOLOGY REPORT*  Clinical Data: Slurred speech.  Right-sided facial droop.  CT HEAD WITHOUT CONTRAST  Technique:  Contiguous axial images were obtained from the base of the skull through the vertex without contrast.  Comparison: Head CT 11/02/2010.  Findings: Well defined area of decreased attenuation in the right  frontoparietal region is unchanged, compatible with encephalomalacia.  No acute intracranial abnormality. Specifically, no definite signs of acute/subacute cerebral ischemia, no evidence of acute intracranial hemorrhage, no mass, mass effect or hydrocephalous.  Mild decreased attenuation in the periventricular white matter of the cerebral hemispheres bilaterally, compatible with mild chronic microvascular ischemic disease.  No acute displaced skull fractures are identified. Visualized paranasal sinuses and mastoids are well pneumatized.  IMPRESSION: 1.  No acute intracranial abnormalities. 2.  Encephalomalacia in the right frontoparietal region related to prior infarction. 3.  Mild chronic microvascular ischemic disease of the periventricular white matter of the cerebral hemispheres bilaterally.   Original Report Authenticated By: Trudie Reed, M.D.    Assessment/Plan: 57 year old lady presenting with probable recurrent partial seizures. TIAs less likely with no clear attributable vascular territory. There no clinical signs of recurrent stroke. However, acute or subacute stroke cannot be ruled out.  Recommendations: 1. Keppra 1000 mg IV loading dose followed by 500 mg twice a day 2. MRI of the brain without contrast rule out recurrent stroke 3. EEG, routine adult  [name] M.D. [facility] [phone]  03/03/2013, 2:40 AM

## 2013-03-03 NOTE — ED Notes (Signed)
Pt. States that 2 weeks ago she had an episode where her mind would not stop racing then in the last 2 days she has been having episodes like the ones she is having here.

## 2013-03-03 NOTE — ED Notes (Signed)
Pt. Has  Episodes where she stops talking in mid sentence and rolls head to right with eyes looking forward. She is unable to speak. She remains in NSR on the monitor during the episode but does not recall  What has taken place or what was asked of her at the time.

## 2013-03-03 NOTE — Progress Notes (Signed)
LTVM up and running   

## 2013-03-03 NOTE — Progress Notes (Signed)
Results received from MRI.  Dr. Sharrie Rothman notified.  Will continue to monitor. Thiells, Mitzi Hansen

## 2013-03-03 NOTE — Progress Notes (Signed)
3:51 PM I agree with HPI/GPe and A/P per Dr. Toniann Fail.  Patient sleepy at bedside, not much h/o obtainable.  Noted MRI brain + for small L frontal CVA-informed [facility]-who spoke c stroke service.  Paitent has had a few episode so seizures and is currnelty on continued EEG.  Will monitor and dispo for Sz actvity and disorder as per Neru       Patient Active Problem List   Diagnosis Date Noted  . Partial seizures 03/03/2013  . Goiter 09/01/2012  . Other screening mammogram 06/13/2012  . Allergic rhinitis 06/13/2012  . Chronic bronchitis 04/02/2012  . Hyponatremia 12/11/2011  . Carotid artery disease 08/20/2011  . OTHER DYSPHAGIA 09/14/2010  . DIZZINESS 07/26/2010  . OBSTRUCTIVE SLEEP APNEA 09/18/2007  . TOBACCO USE 08/08/2007  . DIABETES MELLITUS, TYPE II 03/26/2007  . HYPERLIPIDEMIA 03/26/2007  . ANXIETY 03/26/2007  . DEPRESSION 03/26/2007  . MIGRAINE HEADACHE 03/26/2007  . CARPAL TUNNEL SYNDROME, BILATERAL 03/26/2007  . HYPERTENSION 03/26/2007  . CORONARY ARTERY DISEASE 03/26/2007  . CVA 03/26/2007  . GERD 03/26/2007  . FIBROMYALGIA 03/26/2007   Pleas Koch, MD [facility] [phone]

## 2013-03-03 NOTE — Progress Notes (Signed)
Inpatient Diabetes Program Recommendations  AACE/ADA: New Consensus Statement on Inpatient Glycemic Control (2013)  Target Ranges:  Prepandial:   less than 140 mg/dL      Peak postprandial:   less than 180 mg/dL (1-2 hours)      Critically ill patients:  140 - 180 mg/dL     Results for DUDLEY, COOLEY (MRN [id number]) as of 03/03/2013 15:33  Ref. Range 03/03/2013 04:20 03/03/2013 07:54 03/03/2013 11:43  Glucose-Capillary Latest Range: 70-99 mg/dL 413 (H) 244 (H) 010 (H)    **Patient admitted with seizures.  Seen by Neuro team.  Extremely hyperglycemic on admission.  Noted in progress notes that patient only able to grunt and write to communicate at present.  **Called patient's PCP, Dr. [name] with [facility] to investigate what diabetes medications patient is taking at home to control her blood glucose levels.  Per Dr. Royden Purl RN, patient not taking any oral diabetes medications nor insulin at home.  **Noted A1c ordered and in process.  Also noted Novolog Resistant correction scale and Novolog 3 units tid with meals ordered today.  Received 1st dose at 12noon.   Will follow. Ambrose Finland RN, MSN, CDE Diabetes Coordinator Inpatient Diabetes Program [phone]

## 2013-03-04 DIAGNOSIS — R4789 Other speech disturbances: Secondary | ICD-10-CM | POA: Diagnosis not present

## 2013-03-04 DIAGNOSIS — R569 Unspecified convulsions: Secondary | ICD-10-CM | POA: Diagnosis not present

## 2013-03-04 DIAGNOSIS — G4733 Obstructive sleep apnea (adult) (pediatric): Secondary | ICD-10-CM | POA: Diagnosis not present

## 2013-03-04 DIAGNOSIS — E785 Hyperlipidemia, unspecified: Secondary | ICD-10-CM | POA: Diagnosis not present

## 2013-03-04 LAB — GLUCOSE, CAPILLARY
Glucose-Capillary: 280 mg/dL — ABNORMAL HIGH (ref 70–99)
Glucose-Capillary: 282 mg/dL — ABNORMAL HIGH (ref 70–99)
Glucose-Capillary: 358 mg/dL — ABNORMAL HIGH (ref 70–99)
Glucose-Capillary: 336 mg/dL — ABNORMAL HIGH (ref 70–99)

## 2013-03-04 MED ORDER — INSULIN GLARGINE 100 UNIT/ML ~~LOC~~ SOLN
10.0000 [IU] | Freq: Every day | SUBCUTANEOUS | Status: DC
  Administered 2013-03-04: 10 [IU] via SUBCUTANEOUS
  Filled 2013-03-04 (×2): qty 0.1

## 2013-03-04 MED ORDER — LEVETIRACETAM 750 MG PO TABS
750.0000 mg | ORAL_TABLET | Freq: Two times a day (BID) | ORAL | Status: DC
  Administered 2013-03-05 – 2013-03-06 (×3): 750 mg via ORAL
  Filled 2013-03-04 (×4): qty 1

## 2013-03-04 MED ORDER — SODIUM CHLORIDE 0.9 % IV SOLN
1500.0000 mg | Freq: Once | INTRAVENOUS | Status: AC
  Administered 2013-03-04: 1500 mg via INTRAVENOUS
  Filled 2013-03-04: qty 15

## 2013-03-04 NOTE — Procedures (Signed)
ELECTROENCEPHALOGRAM REPORT   Patient: Diane [name]       Room #: 1O10  Age: 57 y.o.        Sex: female Referring Physician: Ardyth Harps Report Date:  03/04/2013        Interpreting Physician: Thana Farr D  History: Diane [name] is an 57 y.o. female with recurrent episodes of aphasia   Medications:  Scheduled: . aspirin  325 mg Oral Daily  . atorvastatin  20 mg Oral q1800  . carvedilol  12.5 mg Oral BID WC  . clopidogrel  75 mg Oral Q breakfast  . docusate sodium  100 mg Oral BID  . enoxaparin (LOVENOX) injection  40 mg Subcutaneous Daily  . guaiFENesin  1,200 mg Oral BID  . hydrochlorothiazide  50 mg Oral Daily  . insulin aspart  0-20 Units Subcutaneous TID WC  . insulin aspart  3 Units Subcutaneous TID WC  . isosorbide mononitrate  60 mg Oral Daily  . [START ON 03/05/2013] levETIRAcetam  750 mg Oral BID  . loratadine  10 mg Oral Daily  . losartan  100 mg Oral Daily  . omega-3 acid ethyl esters  1 g Oral BID  . pantoprazole  40 mg Oral Daily  . potassium chloride  10 mEq Oral Daily  . ranolazine  500 mg Oral BID  . sodium chloride  3 mL Intravenous Q12H    Conditions of Recording:  This is a continuous 16 channel EEG carried out with accompanying video from 03/03/13 at 2:26pm to 03/04/13 at 8:47am.  The patient is monitored in the awake, drowsy and asleep states.  Description:   Normal awake, drowsy and asleep activity is captured during the tracing along with the expected muscle and movement activity at times obscuring the background.  A posterior background activity is noted at 8-9 hertz and is seen appropriately during the tracing as well.    The patient has multiple periods during tracing when she pushes the event button.  For the majority of these events no change in the background rhythm is noted and a posterior background alpha rhythm is seen.  There is one event that occurred at 4:21pm that correlates with a patient initiated event notification.  Prior to this event  there is generalized high voltage polymorphic delta activity lasting a few seconds.  The patient then develops a rhythmical theta activity the is diffusely distributed and lasts approximately one minute before there is again a few seconds of high voltage generalized polymorphic delta activity.    No activating procedures were performed.    IMPRESSION: The patient did have one event characterized by a build up to rhythmical theta activity and resultant slowing accompanied by clinical changes.  This may very well represent epileptiform activity.  No interictal abnormalities were noted with normal awake, drowsy and asleep activity appreciated.     Thana Farr, MD [facility] [phone] 03/04/2013, 9:14 PM

## 2013-03-04 NOTE — Progress Notes (Signed)
NEURO HOSPITALIST PROGRESS NOTE   SUBJECTIVE:                                                                                                                        Offers no new neurological complains. Stated that she had had " a couple" of his habitual spells since yesterday. At the time of this assessment, she sustained a paroxysmal event that lasted for about 30 seconds and was characterized by staring, mumbling, and inability to speak. However, she said that was able to understand what I was telling her and was capable to properly write down her name in a piece of paper. Afterwards, she was not amnestic for the event and there was not confusion. She recalled that I pinched her in the left arm at the onset of the spell but was also able to remember a word given to her during the spell.  OBJECTIVE:                                                                                                                           Vital signs in last 24 hours: Temp:  [97.3 F (36.3 C)-98.2 F (36.8 C)] 97.8 F (36.6 C) (06/04 0733) Pulse Rate:  [83-98] 93 (06/04 0941) Resp:  [18] 18 (06/04 0733) BP: (124-166)/(68-91) 133/74 mmHg (06/04 0941) SpO2:  [94 %-100 %] 100 % (06/04 0733) Weight:  [67.5 kg (148 lb 13 oz)] 67.5 kg (148 lb 13 oz) (06/04 0400)  Intake/Output from previous day: 06/03 0701 - 06/04 0700 In: 360 [P.O.:360] Out: 1300 [Urine:1300] Intake/Output this shift: Total I/O In: 480 [P.O.:480] Out: 300 [Urine:300] Nutritional status: Carb Control  Past Medical History  Diagnosis Date  . Coronary artery disease     post CABG in 3/07   . Hypertension   . Hyperlipidemia   . Dyslipidemia   . Diabetes mellitus     type 2  . Chronic bronchitis   . CVA (cerebral infarction) 1993  . Tobacco abuse   . COPD (chronic obstructive pulmonary disease)   . Stroke 1993    Neurologic Exam:  Mental Status:  Alert, oriented, thought content  appropriate. Comprehension, naming, and repetition intact. No dysphasia or dysarthria. Cranial Nerves:  II-Visual fields were  normal.  III/IV/VI-Pupils were equal and reacted. Extraocular movements were full and conjugate.  V/VII-no facial numbness and no facial weakness.  VIII-normal.  X-normal speech and symmetrical palatal movement.  Motor: 5/5 bilaterally with normal tone and bulk  Sensory: Normal throughout.  Deep Tendon Reflexes: Extensor on the left and flexor on the right.  Plantars: Flexor bilaterally  Cerebellar: Normal finger-to-nose testing. Did not test heel to shin.  Gait: no tested.   Lab Results: Lab Results  Component Value Date/Time   CHOL 111 12/12/2012  1:00 PM   Lipid Panel No results found for this basename: CHOL, TRIG, HDL, CHOLHDL, VLDL, LDLCALC,  in the last 72 hours  Studies/Results: Ct Head (brain) Wo Contrast  03/03/2013   *RADIOLOGY REPORT*  Clinical Data: Slurred speech.  Right-sided facial droop.  CT HEAD WITHOUT CONTRAST  Technique:  Contiguous axial images were obtained from the base of the skull through the vertex without contrast.  Comparison: Head CT 11/02/2010.  Findings: Well defined area of decreased attenuation in the right frontoparietal region is unchanged, compatible with encephalomalacia.  No acute intracranial abnormality. Specifically, no definite signs of acute/subacute cerebral ischemia, no evidence of acute intracranial hemorrhage, no mass, mass effect or hydrocephalous.  Mild decreased attenuation in the periventricular white matter of the cerebral hemispheres bilaterally, compatible with mild chronic microvascular ischemic disease.  No acute displaced skull fractures are identified. Visualized paranasal sinuses and mastoids are well pneumatized.  IMPRESSION: 1.  No acute intracranial abnormalities. 2.  Encephalomalacia in the right frontoparietal region related to prior infarction. 3.  Mild chronic microvascular ischemic disease of the  periventricular white matter of the cerebral hemispheres bilaterally.   Original Report Authenticated By: Trudie Reed, M.D.   Mr Brain Wo Contrast  03/03/2013   *RADIOLOGY REPORT*  Clinical Data: Staring spells (question seizures).  Hypertensive hyperlipidemic diabetic patient.  MRI HEAD WITHOUT CONTRAST  Technique:  Multiplanar, multiecho pulse sequences of the brain and surrounding structures were obtained according to standard protocol without intravenous contrast.  Comparison: 03/02/2013 and 07/26/2010 CT.  No comparison MR.  Findings: Motion degraded exam.  Tiny acute non hemorrhagic posterior left frontal lobe infarct.  Moderate size area of encephalomalacia in the right parietal/periatrial region unchanged from prior CTs and most likely related to result of prior infarct.  No intracranial hemorrhage.  Mild small vessel disease type changes.  In the right hippocampal region, tiny cysts are noted probably incidental finding rather than primary mass or evidence of mesial temporal sclerosis.  Global atrophy without hydrocephalus.  Major intracranial vascular structures are patent. Ectatic basilar artery impresses the undersurface of the hypothalamic region.  Cervical medullary junction, pituitary region, pineal region and orbital structures unremarkable.  IMPRESSION:  Tiny acute non hemorrhagic posterior left frontal lobe infarct.  Please see above.  This has been made a PRA call report utilizing dashboard call feature.   Original Report Authenticated By: Lacy Duverney, M.D.    MEDICATIONS  I have reviewed the patient's current medications. Prior to Admission:  Prescriptions prior to admission  Medication Sig Dispense Refill  . albuterol (PROAIR HFA) 108 (90 BASE) MCG/ACT inhaler Inhale 2 puffs into the lungs every 6 (six) hours as needed for wheezing.  1 Inhaler  3  . albuterol  (PROVENTIL) (2.5 MG/3ML) 0.083% nebulizer solution Take 3 mLs (2.5 mg total) by nebulization every 6 (six) hours as needed.  360 mL  3  . aspirin 325 MG tablet Take 1 tablet (325 mg total) by mouth daily.  90 tablet  0  . carvedilol (COREG) 12.5 MG tablet Take 12.5 mg by mouth 2 (two) times daily with a meal.      . cetirizine (ZYRTEC) 10 MG tablet Take 1 tablet (10 mg total) by mouth daily.  90 tablet  0  . clopidogrel (PLAVIX) 75 MG tablet Take 75 mg by mouth daily.      . cyclobenzaprine (FLEXERIL) 10 MG tablet Take 10 mg by mouth as needed.        [name] dexlansoprazole (DEXILANT) 60 MG capsule Take 1 capsule (60 mg total) by mouth daily.  30 capsule  11  . docusate sodium (COLACE) 100 MG capsule Take 1 capsule (100 mg total) by mouth 2 (two) times daily.  180 capsule  0  . guaiFENesin (MUCINEX) 600 MG 12 hr tablet Take 2 tablets (1,200 mg total) by mouth 2 (two) times daily.  360 tablet  0  . hydrochlorothiazide (HYDRODIURIL) 50 MG tablet Take 50 mg by mouth daily.      . isosorbide mononitrate (IMDUR) 60 MG 24 hr tablet TAKE 1 TABLET (60MG  TOTAL) BY MOUTH TWICE A DAY  180 tablet  0  . KLOR-CON M10 10 MEQ tablet TAKE 1 TABLET TWICE DAILY  180 tablet  1  . losartan (COZAAR) 100 MG tablet Take 1 tablet (100 mg total) by mouth daily.  90 tablet  3  . nitroGLYCERIN (NITROSTAT) 0.4 MG SL tablet Place 1 tablet (0.4 mg total) under the tongue every 5 (five) minutes as needed.  25 tablet  11  . Omega-3 Fatty Acids ([employer] NATURAL FISH OIL) 1200 MG CAPS Take 1 capsule by mouth 2 (two) times daily.  180 capsule  0  . ranolazine (RANEXA) 500 MG 12 hr tablet Take 500 mg by mouth 2 (two) times daily.      . rosuvastatin (CRESTOR) 10 MG tablet Take 10 mg by mouth daily.      . sodium chloride ([employer] SALINE NASAL SPRAY) 0.65 % nasal spray Place 1 spray into the nose as needed for congestion.  30 mL  12  . traMADol (ULTRAM) 50 MG tablet Take 50 mg by mouth 2 (two) times daily.          ASSESSMENT/PLAN:                                                                                                            57 years old female with history of right fronto-parietal infarct many years ago, admitted to [facility]  with recurrent, paroxysmal, stereotype spells of staring and inability to speak but with apparent preservation of consciousness. She is undergoing continuous EEG monitoring and will review patient's episodes from yesterday and today. Will continue to follow.  Wyatt Portela, MD [facility] [phone]  03/04/2013, 10:37 AM

## 2013-03-04 NOTE — Progress Notes (Deleted)
Preliminary continuous EEG review: Patient sustained 3 episodes characterized by staring, decreased responsiveness, and inability to talk lasting for about one minute. No obvious post ictal confusion. The clinical event was associated with electrographic seizures that seem to emanate from the left temporo-parietal region and then propagate to the right temporal region. The overall findings are consistent with a partial epilepsy most likely arising from the left temporal region. Patient was loaded with 1.5 gram IV keppra and maintenance dose 750 mg BID starting tomorrow. Full report of C-EEG monitoring to follow.  Wyatt Portela, MD

## 2013-03-04 NOTE — Progress Notes (Signed)
LTVM completed 

## 2013-03-04 NOTE — Progress Notes (Signed)
[facility] PROGRESS NOTE  Diane [name] ZOX:[id number] DOB: 05-29-1956 DOA: 03/02/2013 PCP: [name], MD  Assessment/Plan:  Transient episodes of aphasia and fixed gaze -There is some concern for partial seizures. -Appreciate neurology consultation. -Has been started on Keppra. -Is currently hooked up to a continuous EEG. We'll have more information once this is completed and read. -MRI with concern for a small infarct, however neurology believes this is more of an artifact.  Uncontrolled type 2 diabetes -CBGs improved. - continue current medications and adjust as needed. -Has not been compliant as an outpatient.  Tobacco abuse -Counseled on cessation.  Hypertension -Well controlled.  Code Status: Full code Family Communication: None today  Disposition Plan: Home when stable, likely 24-48 hours.   Consultants:  Neurology, Dr. Pearlean Brownie   Antibiotics:  None   Subjective: No complaints.  Objective: Filed Vitals:   03/04/13 0000 03/04/13 0400 03/04/13 0733 03/04/13 0941  BP: 166/91 137/80 139/88 133/74  Pulse: 86 91 98 93  Temp: 98.2 F (36.8 C) 98.2 F (36.8 C) 97.8 F (36.6 C)   TempSrc: Oral Oral Oral   Resp:   18   Height:      Weight:  67.5 kg (148 lb 13 oz)    SpO2: 94% 100% 100%     Intake/Output Summary (Last 24 hours) at 03/04/13 1022 Last data filed at 03/04/13 0834  Gross per 24 hour  Intake    480 ml  Output   1600 ml  Net  -1120 ml   Filed Weights   03/03/13 0531 03/04/13 0400  Weight: 67.5 kg (148 lb 13 oz) 67.5 kg (148 lb 13 oz)    Exam:   General:  Alert, awake, oriented, no acute distress  Cardiovascular: Regular rate and rhythm, no murmurs, rubs or gallops.  Respiratory: Clear to auscultation bilaterally  Abdomen: Soft, nontender, nondistended, positive bowel sounds, no masses or organomegaly noted  Extremities: No clubbing, cyanosis or edema   Neurologic:  Grossly intact and nonfocal  Data Reviewed: Basic  Metabolic Panel:  Recent Labs Lab 03/02/13 2313 03/03/13 0535  NA 133* 133*  K 4.2 3.3*  CL 93* 94*  CO2 30 29  GLUCOSE 477* 273*  BUN 9 7  CREATININE 0.55 0.42*  CALCIUM 9.8 9.7   Liver Function Tests:  Recent Labs Lab 03/02/13 2313  AST 18  ALT 18  ALKPHOS 111  BILITOT 0.2*  PROT 6.9  ALBUMIN 3.7   No results found for this basename: LIPASE, AMYLASE,  in the last 168 hours No results found for this basename: AMMONIA,  in the last 168 hours CBC:  Recent Labs Lab 03/02/13 2313 03/03/13 0535  WBC 6.1 6.3  NEUTROABS 3.2  --   HGB 12.0 12.1  HCT 37.8 38.4  MCV 69.6* 69.2*  PLT 255 239   Cardiac Enzymes:  Recent Labs Lab 03/02/13 2313  TROPONINI <0.30   BNP (last 3 results) No results found for this basename: PROBNP,  in the last 8760 hours CBG:  Recent Labs Lab 03/03/13 0420 03/03/13 0754 03/03/13 1143 03/03/13 1654 03/04/13 0739  GLUCAP 308* 302* 426* 277* 280*    No results found for this or any previous visit (from the past 240 hour(s)).   Studies: Ct Head (brain) Wo Contrast  03/03/2013   *RADIOLOGY REPORT*  Clinical Data: Slurred speech.  Right-sided facial droop.  CT HEAD WITHOUT CONTRAST  Technique:  Contiguous axial images were obtained from the base of the skull through the vertex without contrast.  Comparison: Head CT 11/02/2010.  Findings: Well defined area of decreased attenuation in the right frontoparietal region is unchanged, compatible with encephalomalacia.  No acute intracranial abnormality. Specifically, no definite signs of acute/subacute cerebral ischemia, no evidence of acute intracranial hemorrhage, no mass, mass effect or hydrocephalous.  Mild decreased attenuation in the periventricular white matter of the cerebral hemispheres bilaterally, compatible with mild chronic microvascular ischemic disease.  No acute displaced skull fractures are identified. Visualized paranasal sinuses and mastoids are well pneumatized.  IMPRESSION: 1.   No acute intracranial abnormalities. 2.  Encephalomalacia in the right frontoparietal region related to prior infarction. 3.  Mild chronic microvascular ischemic disease of the periventricular white matter of the cerebral hemispheres bilaterally.   Original Report Authenticated By: Trudie Reed, M.D.   Mr Brain Wo Contrast  03/03/2013   *RADIOLOGY REPORT*  Clinical Data: Staring spells (question seizures).  Hypertensive hyperlipidemic diabetic patient.  MRI HEAD WITHOUT CONTRAST  Technique:  Multiplanar, multiecho pulse sequences of the brain and surrounding structures were obtained according to standard protocol without intravenous contrast.  Comparison: 03/02/2013 and 07/26/2010 CT.  No comparison MR.  Findings: Motion degraded exam.  Tiny acute non hemorrhagic posterior left frontal lobe infarct.  Moderate size area of encephalomalacia in the right parietal/periatrial region unchanged from prior CTs and most likely related to result of prior infarct.  No intracranial hemorrhage.  Mild small vessel disease type changes.  In the right hippocampal region, tiny cysts are noted probably incidental finding rather than primary mass or evidence of mesial temporal sclerosis.  Global atrophy without hydrocephalus.  Major intracranial vascular structures are patent. Ectatic basilar artery impresses the undersurface of the hypothalamic region.  Cervical medullary junction, pituitary region, pineal region and orbital structures unremarkable.  IMPRESSION:  Tiny acute non hemorrhagic posterior left frontal lobe infarct.  Please see above.  This has been made a PRA call report utilizing dashboard call feature.   Original Report Authenticated By: Lacy Duverney, M.D.    Scheduled Meds: . aspirin  325 mg Oral Daily  . atorvastatin  20 mg Oral q1800  . carvedilol  12.5 mg Oral BID WC  . clopidogrel  75 mg Oral Q breakfast  . docusate sodium  100 mg Oral BID  . enoxaparin (LOVENOX) injection  40 mg Subcutaneous Daily  .  guaiFENesin  1,200 mg Oral BID  . hydrochlorothiazide  50 mg Oral Daily  . insulin aspart  0-20 Units Subcutaneous TID WC  . insulin aspart  3 Units Subcutaneous TID WC  . isosorbide mononitrate  60 mg Oral Daily  . levETIRAcetam  500 mg Oral BID  . loratadine  10 mg Oral Daily  . losartan  100 mg Oral Daily  . omega-3 acid ethyl esters  1 g Oral BID  . pantoprazole  40 mg Oral Daily  . potassium chloride  10 mEq Oral Daily  . ranolazine  500 mg Oral BID  . sodium chloride  3 mL Intravenous Q12H   Continuous Infusions: . sodium chloride      Principal Problem:   Partial seizures Active Problems:   DIABETES MELLITUS, TYPE II   TOBACCO USE   OBSTRUCTIVE SLEEP APNEA   HYPERTENSION   CORONARY ARTERY DISEASE   CVA    Time spent: 35 minutes    [name],[name]  [facility] Pager [phone]  If 7PM-7AM, please contact night-coverage at [email or web address], password [facility] 03/04/2013, 10:22 AM  LOS: 2 days

## 2013-03-04 NOTE — Progress Notes (Signed)
6/4  CBGs on 6/3: 302-426-277 mg/dl   6/4:  742 mg/dl  Recommend starting Lantus 10-15 units daily.   HgbA1C was 12.8% on 03/03/13.  If being discharged on insulin, will need education by staff RNs. Will continue to follow while in hospital.  Smith Mince RN BSN CDE

## 2013-03-05 DIAGNOSIS — F411 Generalized anxiety disorder: Secondary | ICD-10-CM | POA: Diagnosis not present

## 2013-03-05 DIAGNOSIS — R4789 Other speech disturbances: Secondary | ICD-10-CM | POA: Diagnosis not present

## 2013-03-05 LAB — GLUCOSE, CAPILLARY
Glucose-Capillary: 273 mg/dL — ABNORMAL HIGH (ref 70–99)
Glucose-Capillary: 313 mg/dL — ABNORMAL HIGH (ref 70–99)
Glucose-Capillary: 277 mg/dL — ABNORMAL HIGH (ref 70–99)
Glucose-Capillary: 349 mg/dL — ABNORMAL HIGH (ref 70–99)

## 2013-03-05 MED ORDER — INSULIN GLARGINE 100 UNIT/ML ~~LOC~~ SOLN
15.0000 [IU] | Freq: Every day | SUBCUTANEOUS | Status: DC
  Administered 2013-03-05: 15 [IU] via SUBCUTANEOUS
  Filled 2013-03-05 (×2): qty 0.15

## 2013-03-05 NOTE — Consult Note (Signed)
Reason for Consult:  Psychogenic seizures Referring Physician: Henderson Cloud, MD  Diane [name] is an 57 y.o. female.  HPI: Patient has been having brief episodes of not able to talk or staring spells. She has sugar is over 600 because she ran out of medication and did not see her doctor. She has lapses in her insurance. She states that these episodes started few days ago and feels due to mini stroke. She has complete neurological work up which was negative for focal deficits. She has been suffering with multiple medical problems and seeing several specialist, pulmonologist for COPD, Cardiology for CAD, endocrinology for DM, [facility] G[facility] for stomach GERD and PCP. She has been seeing Diane Kern, MD for Fibromyalgia at [facility] orth[facility]. She stated that the main problem is 'I see too many doctors over years and rebel over all over medical appointments due to being tired of seeing them. I have also trouble keeping up all ot the scheduled medical appointments. Diane Manns, MD, PCP at [place] [facility], last seen about six months ago.   She lives by her self and disabled since 1999 for stroke. She was married x 2 when she was young and has no children. She has family living close by, mother, sister, nephew x 2.  She smokes 2 ppd, occasional alcohol and denied street drugs. She has no previous psych hospitals. Reportedly her mother and sister came to visit her. She feels that she is getting better because of less episodes and less time. I am perfectly able to take care of my slef. She has no history of falls, no hx of seizure, MVA and no broken bones recently.   Mental Status Examination: Patient appeared calm, quiet and cooperative. She has brief periods of several episodes of difficulty to talk throughout evaluation, feels like stuttering without audible words. She is dressed in hospital gown and wearing eye glasses. She has good eye contact. Patient has fine mood and his  affect was appropriate. She has normal speech in between the episodes. Her thought process is linear and goal directed. Patient has denied suicidal, homicidal ideations, intentions or plans. Patient has no evidence of auditory or visual hallucinations, delusions, and paranoia. Patient has fair insight judgment and impulse control.  Past Medical History  Diagnosis Date  . Coronary artery disease     post CABG in 3/07   . Hypertension   . Hyperlipidemia   . Dyslipidemia   . Diabetes mellitus     type 2  . Chronic bronchitis   . CVA (cerebral infarction) 1993  . Tobacco abuse   . COPD (chronic obstructive pulmonary disease)   . Stroke 1993    Past Surgical History  Procedure Laterality Date  . Coronary stent placement  08/11/06    PCI of her ciurcumflex/OM vessel  . Cardiac catheterization  11/29/05    EF of 55%  . Cardiac catheterization  08/06/06    EF of 45-50%  . Coronary artery bypass graft  12/04/2005    x5 -- left internal mammary artery to the LAD, left radial artery to the ramus intermedius, saphenous vein graft to the obtuse marginal 1, sequential saphenous vein grat to the acute marginal and posterior descending, endoscopic vein harvesting from the left thigh with open vein harvest from right leg    Family History  Problem Relation Age of Onset  . Coronary artery disease    . Heart disease    . Heart disease Mother   . Diabetes  Mother   . COPD Mother   . Hyperlipidemia Mother   . Hypertension Mother   . Cancer Father   . Drug abuse Paternal Grandmother   . Stroke Paternal Grandfather     Social History:  reports that she has been smoking Cigarettes.  She has a 72 pack-year smoking history. She has never used smokeless tobacco. She reports that she drinks about 0.5 ounces of alcohol per week. She reports that she does not use illicit drugs.  Allergies:  Allergies  Allergen Reactions  . Ace Inhibitors     REACTION: cough  . Tetracycline     REACTION: reaction not  known    Medications: I have reviewed the patient's current medications.  Results for orders placed during the hospital encounter of 03/02/13 (from the past 48 hour(s))  GLUCOSE, CAPILLARY     Status: Abnormal   Collection Time    03/03/13  4:54 PM      Result Value Range   Glucose-Capillary 277 (*) 70 - 99 mg/dL  GLUCOSE, CAPILLARY     Status: Abnormal   Collection Time    03/04/13  7:39 AM      Result Value Range   Glucose-Capillary 280 (*) 70 - 99 mg/dL  GLUCOSE, CAPILLARY     Status: Abnormal   Collection Time    03/04/13 11:41 AM      Result Value Range   Glucose-Capillary 282 (*) 70 - 99 mg/dL  GLUCOSE, CAPILLARY     Status: Abnormal   Collection Time    03/04/13  4:14 PM      Result Value Range   Glucose-Capillary 358 (*) 70 - 99 mg/dL  GLUCOSE, CAPILLARY     Status: Abnormal   Collection Time    03/04/13  9:08 PM      Result Value Range   Glucose-Capillary 336 (*) 70 - 99 mg/dL   Comment 1 Notify RN    GLUCOSE, CAPILLARY     Status: Abnormal   Collection Time    03/05/13  7:21 AM      Result Value Range   Glucose-Capillary 273 (*) 70 - 99 mg/dL  GLUCOSE, CAPILLARY     Status: Abnormal   Collection Time    03/05/13 11:49 AM      Result Value Range   Glucose-Capillary 313 (*) 70 - 99 mg/dL    No results found.  Positive for pseudoseizures, fibromyalgia Blood pressure 137/74, pulse 94, temperature 97.4 F (36.3 C), temperature source Oral, resp. rate 18, height 4\' 10"  (1.473 m), weight 150 lb 5.7 oz (68.2 kg), last menstrual period 09/02/2003, SpO2 97.00%.   Assessment/Plan: psychogenic seizures  Recommendation:  1. Patient does not meet criteria for acute psychiatric hospital and recommend out patient counseling services and recommended no medication. 2. She has capacity to make decision regarding medical care, and living arrangement 3. She may benefit from rehab services as she continue to have episodes if needed 4. She does not want to go to long term  facility - [facility], feels not helpful 5. Appreciate psychiatric consultation and will sign off  Diane [name],Diane R. 03/05/2013, 4:18 PM

## 2013-03-05 NOTE — Progress Notes (Signed)
[facility] PROGRESS NOTE  Diane [name] ZOX:[id number] DOB: Mar 12, 1956 DOA: 03/02/2013 PCP: Diane Manns, MD  Assessment/Plan:  Transient episodes of aphasia and fixed gaze -Patient had multiple of these episodes while hooked up to the continuous 24-hour EEG monitor. On only one occasion does the neurologist describe possible seizure activity. -These episodes seem to be more frequent when patient is stressed out or anxious. Today while in her room for about  15 minutes I witnessed about 5 of these episodes in which she starts mumbling and has a fixed gaze and then stares for about 30 seconds before she becomes "normal" again. I suspect that some of these episodes may indeed be psychogenic seizures. I have discussed this via telephone with neurology, Diane [name], and he agrees with my assessment. -We will request psychiatry consultation. -Appreciate neurology consultation. -Has been started on Keppra. -MRI with concern for a small infarct, however neurology believes this is more of an artifact.  Uncontrolled type 2 diabetes -CBGs uncontrolled -Increase lantus to 15 units.  Tobacco abuse -Counseled on cessation.  Hypertension -Well controlled.  Code Status: Full code Family Communication: None today  Disposition Plan: Looking into SNF as we have some safety concerns and there is no family to provide supervision.   Consultants:  Neurology, Diane [name]   Antibiotics:  None   Subjective: No complaints. Sister present and updated on plan of care.  Objective: Filed Vitals:   03/05/13 0400 03/05/13 0723 03/05/13 0900 03/05/13 1150  BP: 133/83 140/85 114/81 137/74  Pulse: 86 90  94  Temp: 98.2 F (36.8 C) 98.2 F (36.8 C)  97.4 F (36.3 C)  TempSrc: Oral Oral  Oral  Resp: 18 18  18   Height:      Weight: 68.2 kg (150 lb 5.7 oz)     SpO2: 96% 99%  97%    Intake/Output Summary (Last 24 hours) at 03/05/13 1540 Last data filed at 03/05/13 1250  Gross per 24 hour   Intake    680 ml  Output   2451 ml  Net  -1771 ml   Filed Weights   03/03/13 0531 03/04/13 0400 03/05/13 0400  Weight: 67.5 kg (148 lb 13 oz) 67.5 kg (148 lb 13 oz) 68.2 kg (150 lb 5.7 oz)    Exam:   General:  Alert, awake, oriented, no acute distress  Cardiovascular: Regular rate and rhythm, no murmurs, rubs or gallops.  Respiratory: Clear to auscultation bilaterally  Abdomen: Soft, nontender, nondistended, positive bowel sounds, no masses or organomegaly noted  Extremities: No clubbing, cyanosis or edema   Neurologic:  Grossly intact and nonfocal  Data Reviewed: Basic Metabolic Panel:  Recent Labs Lab 03/02/13 2313 03/03/13 0535  NA 133* 133*  K 4.2 3.3*  CL 93* 94*  CO2 30 29  GLUCOSE 477* 273*  BUN 9 7  CREATININE 0.55 0.42*  CALCIUM 9.8 9.7   Liver Function Tests:  Recent Labs Lab 03/02/13 2313  AST 18  ALT 18  ALKPHOS 111  BILITOT 0.2*  PROT 6.9  ALBUMIN 3.7   No results found for this basename: LIPASE, AMYLASE,  in the last 168 hours No results found for this basename: AMMONIA,  in the last 168 hours CBC:  Recent Labs Lab 03/02/13 2313 03/03/13 0535  WBC 6.1 6.3  NEUTROABS 3.2  --   HGB 12.0 12.1  HCT 37.8 38.4  MCV 69.6* 69.2*  PLT 255 239   Cardiac Enzymes:  Recent Labs Lab 03/02/13 2313  TROPONINI <0.30  BNP (last 3 results) No results found for this basename: PROBNP,  in the last 8760 hours CBG:  Recent Labs Lab 03/04/13 1141 03/04/13 1614 03/04/13 2108 03/05/13 0721 03/05/13 1149  GLUCAP 282* 358* 336* 273* 313*    No results found for this or any previous visit (from the past 240 hour(s)).   Studies: No results found.  Scheduled Meds: . aspirin  325 mg Oral Daily  . atorvastatin  20 mg Oral q1800  . carvedilol  12.5 mg Oral BID WC  . clopidogrel  75 mg Oral Q breakfast  . docusate sodium  100 mg Oral BID  . enoxaparin (LOVENOX) injection  40 mg Subcutaneous Daily  . guaiFENesin  1,200 mg Oral BID  .  hydrochlorothiazide  50 mg Oral Daily  . insulin aspart  0-20 Units Subcutaneous TID WC  . insulin aspart  3 Units Subcutaneous TID WC  . insulin glargine  10 Units Subcutaneous QHS  . isosorbide mononitrate  60 mg Oral Daily  . levETIRAcetam  750 mg Oral BID  . loratadine  10 mg Oral Daily  . losartan  100 mg Oral Daily  . omega-3 acid ethyl esters  1 g Oral BID  . pantoprazole  40 mg Oral Daily  . potassium chloride  10 mEq Oral Daily  . ranolazine  500 mg Oral BID  . sodium chloride  3 mL Intravenous Q12H   Continuous Infusions: . sodium chloride      Principal Problem:   Partial seizures Active Problems:   DIABETES MELLITUS, TYPE II   TOBACCO USE   OBSTRUCTIVE SLEEP APNEA   HYPERTENSION   CORONARY ARTERY DISEASE   CVA    Time spent: 35 minutes    Diane [name]  [facility] Pager [phone]  If 7PM-7AM, please contact night-coverage at [email or web address], password [facility] 03/05/2013, 3:40 PM  LOS: 3 days

## 2013-03-05 NOTE — Progress Notes (Signed)
6/5  Recommend increasing Novolog meal coverage to 5 units TID.(patient is eating at least 50% of meals)  If CBGs continue greater than 180 mg/dl, increase Lantus dosage to 15 units daily. Smith Mince RN BSN CDE

## 2013-03-05 NOTE — Progress Notes (Signed)
NEURO HOSPITALIST PROGRESS NOTE   SUBJECTIVE:                                                                                                                        Offers no new neurological complains. Continuous video-eeg final report: "The patient did have one event characterized by a build up to rhythmical theta activity and resultant slowing accompanied by clinical changes. This may very well represent epileptiform activity. No interictal abnormalities were noted with normal awake, drowsy and asleep activity appreciated".  Started on keppra and no side effects reported so far.    OBJECTIVE:                                                                                                                           Vital signs in last 24 hours: Temp:  [97.9 F (36.6 C)-98.9 F (37.2 C)] 98.2 F (36.8 C) (06/05 0723) Pulse Rate:  [83-96] 90 (06/05 0723) Resp:  [16-20] 18 (06/05 0723) BP: (124-144)/(67-93) 140/85 mmHg (06/05 0723) SpO2:  [92 %-99 %] 99 % (06/05 0723) Weight:  [68.2 kg (150 lb 5.7 oz)] 68.2 kg (150 lb 5.7 oz) (06/05 0400)  Intake/Output from previous day: 06/04 0701 - 06/05 0700 In: 1040 [P.O.:1040] Out: 2900 [Urine:2900] Intake/Output this shift: Total I/O In: 360 [P.O.:360] Out: 200 [Urine:200] Nutritional status: Carb Control  Past Medical History  Diagnosis Date  . Coronary artery disease     post CABG in 3/07   . Hypertension   . Hyperlipidemia   . Dyslipidemia   . Diabetes mellitus     type 2  . Chronic bronchitis   . CVA (cerebral infarction) 1993  . Tobacco abuse   . COPD (chronic obstructive pulmonary disease)   . Stroke 1993    Neurologic Exam:  Mental Status:  Alert, oriented, thought content appropriate. Comprehension, naming, and repetition intact. No dysphasia or dysarthria.  Cranial Nerves:  II-Visual fields were normal.  III/IV/VI-Pupils were equal and reacted. Extraocular movements were full  and conjugate.  V/VII-no facial numbness and no facial weakness.  VIII-normal.  X-normal speech and symmetrical palatal movement.  Motor: 5/5 bilaterally with normal tone and bulk  Sensory: Normal throughout.  Deep Tendon Reflexes: Extensor on the left  and flexor on the right.  Plantars: Flexor bilaterally  Cerebellar: Normal finger-to-nose testing. Did not test heel to shin.  Gait: no tested.   Lab Results: Lab Results  Component Value Date/Time   CHOL 111 12/12/2012  1:00 PM   Lipid Panel No results found for this basename: CHOL, TRIG, HDL, CHOLHDL, VLDL, LDLCALC,  in the last 72 hours  Studies/Results: No results found.  MEDICATIONS                                                                                                                       I have reviewed the patient's current medications.  ASSESSMENT/PLAN:                                                                                                            Patient's electro-clinical syndrome seem to be consistent with a partial epilepsy, although can not entirely exclude the possibility of a mixed seizure disorder (non epileptic and epileptic). Continue keppra at current dose. Needs outpatient neurology follow up for further medication adjustment. Will sign off. Please, call with questions, concerns, or new neurological developments.   Wyatt Portela, MD [facility] [phone]  03/05/2013, 9:19 AM

## 2013-03-06 DIAGNOSIS — N179 Acute kidney failure, unspecified: Secondary | ICD-10-CM | POA: Diagnosis present

## 2013-03-06 DIAGNOSIS — E785 Hyperlipidemia, unspecified: Secondary | ICD-10-CM | POA: Diagnosis present

## 2013-03-06 DIAGNOSIS — E119 Type 2 diabetes mellitus without complications: Secondary | ICD-10-CM | POA: Diagnosis not present

## 2013-03-06 DIAGNOSIS — Z951 Presence of aortocoronary bypass graft: Secondary | ICD-10-CM | POA: Diagnosis not present

## 2013-03-06 DIAGNOSIS — I2510 Atherosclerotic heart disease of native coronary artery without angina pectoris: Secondary | ICD-10-CM | POA: Diagnosis present

## 2013-03-06 DIAGNOSIS — G40109 Localization-related (focal) (partial) symptomatic epilepsy and epileptic syndromes with simple partial seizures, not intractable, without status epilepticus: Secondary | ICD-10-CM | POA: Diagnosis present

## 2013-03-06 DIAGNOSIS — G40401 Other generalized epilepsy and epileptic syndromes, not intractable, with status epilepticus: Secondary | ICD-10-CM | POA: Diagnosis not present

## 2013-03-06 DIAGNOSIS — F17200 Nicotine dependence, unspecified, uncomplicated: Secondary | ICD-10-CM | POA: Diagnosis not present

## 2013-03-06 DIAGNOSIS — Z8673 Personal history of transient ischemic attack (TIA), and cerebral infarction without residual deficits: Secondary | ICD-10-CM | POA: Diagnosis not present

## 2013-03-06 DIAGNOSIS — I6350 Cerebral infarction due to unspecified occlusion or stenosis of unspecified cerebral artery: Secondary | ICD-10-CM | POA: Diagnosis not present

## 2013-03-06 DIAGNOSIS — M6281 Muscle weakness (generalized): Secondary | ICD-10-CM | POA: Diagnosis not present

## 2013-03-06 DIAGNOSIS — F411 Generalized anxiety disorder: Secondary | ICD-10-CM | POA: Diagnosis not present

## 2013-03-06 DIAGNOSIS — I6789 Other cerebrovascular disease: Secondary | ICD-10-CM | POA: Diagnosis not present

## 2013-03-06 DIAGNOSIS — D509 Iron deficiency anemia, unspecified: Secondary | ICD-10-CM | POA: Diagnosis present

## 2013-03-06 DIAGNOSIS — IMO0001 Reserved for inherently not codable concepts without codable children: Secondary | ICD-10-CM | POA: Diagnosis not present

## 2013-03-06 DIAGNOSIS — G43909 Migraine, unspecified, not intractable, without status migrainosus: Secondary | ICD-10-CM | POA: Diagnosis present

## 2013-03-06 DIAGNOSIS — G9389 Other specified disorders of brain: Secondary | ICD-10-CM | POA: Diagnosis not present

## 2013-03-06 DIAGNOSIS — G4733 Obstructive sleep apnea (adult) (pediatric): Secondary | ICD-10-CM | POA: Diagnosis present

## 2013-03-06 DIAGNOSIS — E876 Hypokalemia: Secondary | ICD-10-CM | POA: Diagnosis present

## 2013-03-06 DIAGNOSIS — Z9861 Coronary angioplasty status: Secondary | ICD-10-CM | POA: Diagnosis not present

## 2013-03-06 DIAGNOSIS — R569 Unspecified convulsions: Secondary | ICD-10-CM | POA: Diagnosis not present

## 2013-03-06 DIAGNOSIS — G459 Transient cerebral ischemic attack, unspecified: Secondary | ICD-10-CM | POA: Diagnosis not present

## 2013-03-06 DIAGNOSIS — I69928 Other speech and language deficits following unspecified cerebrovascular disease: Secondary | ICD-10-CM | POA: Diagnosis not present

## 2013-03-06 DIAGNOSIS — R1310 Dysphagia, unspecified: Secondary | ICD-10-CM | POA: Diagnosis present

## 2013-03-06 DIAGNOSIS — R262 Difficulty in walking, not elsewhere classified: Secondary | ICD-10-CM | POA: Diagnosis not present

## 2013-03-06 DIAGNOSIS — I10 Essential (primary) hypertension: Secondary | ICD-10-CM | POA: Diagnosis not present

## 2013-03-06 LAB — GLUCOSE, CAPILLARY
Glucose-Capillary: 276 mg/dL — ABNORMAL HIGH (ref 70–99)
Glucose-Capillary: 304 mg/dL — ABNORMAL HIGH (ref 70–99)
Glucose-Capillary: 376 mg/dL — ABNORMAL HIGH (ref 70–99)

## 2013-03-06 MED ORDER — "BD GETTING STARTED TAKE HOME KIT: 1ML X 30 G SYRINGES, "
1.0000 | Freq: Once | Status: AC
  Administered 2013-03-06: 1
  Filled 2013-03-06: qty 1

## 2013-03-06 MED ORDER — SALINE SPRAY 0.65 % NA SOLN
1.0000 | NASAL | Status: DC | PRN
  Administered 2013-03-06: 1 via NASAL
  Filled 2013-03-06: qty 44

## 2013-03-06 MED ORDER — LEVETIRACETAM 750 MG PO TABS: 750.0000 mg | ORAL_TABLET | Freq: Two times a day (BID) | ORAL | Status: DC

## 2013-03-06 MED ORDER — INSULIN GLARGINE 100 UNIT/ML ~~LOC~~ SOLN: 15.0000 [IU] | Freq: Every day | SUBCUTANEOUS | Status: DC

## 2013-03-06 NOTE — Evaluation (Signed)
Occupational Therapy Evaluation Patient Details Name: Diane [name] MRN: [id number] DOB: August 02, 1956 Today's Date: 03/06/2013 Time: 4098-1191 OT Time Calculation (min): 34 min  OT Assessment / Plan / Recommendation Clinical Impression  57 y.o. female was brought to the ER after patient was witnessed to have increasing staring spells at home. Patient had similar episodes in the ER twice once witnessed by the ER physician. As per the ER physician patient was staring towards the right side for a few seconds. Patient did not lose consciousness after the incident. CT head did not show anything acute and patient did not have any acute focal deficits. Neurologist on-call Dr. Roseanne Reno was consulted and at this time patient was started on Keppra for possible partial seizures.  Patient presents to OT unable to verbalize, answer questions, or follow verbal commands during a 30 minute evaluation.  Patient had 6-7 episodes which began with mumbling, fixed gaze (usually to the right with head turn to right), often with hand in the air in a catatonic state and unable to follow commands. See below for details of session. Do not feel that patient is safe to d/c home.  Patient may benefit from CIR stay.  Patient would benefit from Speech Language Pathology Concult.   OT Assessment  Patient needs continued OT Services    Follow Up Recommendations  CIR;Supervision/Assistance - 24 hour    Barriers to Discharge Decreased caregiver support    Equipment Recommendations   (TBA)    Recommendations for Other Services Rehab consult;Speech consult  Frequency  Min 3X/week    Precautions / Restrictions Precautions Precautions: Fall Restrictions Weight Bearing Restrictions: No   Pertinent Vitals/Pain No indication of pain    ADL  Grooming: Performed;Wash/dry face;Teeth care;Set up;Other (comment) (Maximum multimodal cues required to complete tasks) Transfers/Ambulation Related to ADLs: Required physical guiding cues  and show patient contextural grooming items to get patient to get out of bed and stand at sink for groom tasks.  Patient was supervision for functional mobility once she seemed to understand what I wanted her to do. ADL Comments: In a 30 minute period did not speak an intelligible word yet exhibited 6-7 episodes of mumbling followed by short period of staring (often with right head turn and right gaze) and arms/hands in the air in a catatonic state which each lasted ~45-60 seconds.  When patient's name called ~ 10 seconds into this state, she was often able to "come out of it" enough to look at me.  She was only able to initiate ~50% of various steps to complete simple grooming tasks however with multimodal cueing, patient able to complete washing face and brushing teeth.      OT Diagnosis: Cognitive deficits;Altered mental status  OT Problem List: Decreased activity tolerance;Impaired balance (sitting and/or standing);Decreased safety awareness;Decreased cognition;Other (comment) (language deficit and seizure like episodes) OT Treatment Interventions: Self-care/ADL training;Energy conservation;Therapeutic activities;Cognitive remediation/compensation;Patient/family education;Balance training   OT Goals Acute Rehab OT Goals OT Goal Formulation: Patient unable to participate in goal setting ADL Goals Pt Will Perform Grooming: with modified independence;Standing at sink ADL Goal: Grooming - Progress: Goal set today Pt Will Perform Upper Body Bathing: with modified independence;Sitting at sink ADL Goal: Upper Body Bathing - Progress: Goal set today Pt Will Perform Lower Body Bathing: with modified independence;Sit to stand from chair ADL Goal: Lower Body Bathing - Progress: Goal set today Pt Will Perform Upper Body Dressing: with modified independence;Sitting, chair ADL Goal: Upper Body Dressing - Progress: Goal set today Pt Will Perform  Lower Body Dressing: with modified independence;Sit to stand  from chair ADL Goal: Lower Body Dressing - Progress: Goal set today Pt Will Transfer to Toilet: with modified independence;Regular height toilet ADL Goal: Toilet Transfer - Progress: Goal set today Pt Will Perform Toileting - Clothing Manipulation: with modified independence;Standing ADL Goal: Toileting - Clothing Manipulation - Progress: Goal set today Pt Will Perform Toileting - Hygiene: with modified independence;Sit to stand from 3-in-1/toilet ADL Goal: Toileting - Hygiene - Progress: Goal set today  Visit Information  Last OT Received On: 03/06/13 Assistance Needed: +1    Subjective Data      Prior Functioning     Home Living Lives With: Alone Type of Home: House Home Access: Level entry Home Layout: One level Home Adaptive Equipment: None Additional Comments: Patient unable to talk or provide this information for OT session (followed by PT session who was able to get the above information) Prior Function Level of Independence: Independent Able to Take Stairs?: Yes Driving: Yes Vocation: On disability Comments: Patient unable to talk or provide this information for OT session (followed by PT session who was able to get the above information) Communication Communication: Receptive difficulties;Expressive difficulties (Unable to speak an intelligible word or follow verbal comman)         Vision/Perception Vision - History Baseline Vision:  (wearing glasses on eval today) Vision - Assessment Vision Assessment: Vision not tested (Patient unable to participate)   Cognition  Cognition Arousal/Alertness: Awake/alert Overall Cognitive Status: No family/caregiver present to determine baseline cognitive functioning Area of Impairment: Orientation;Attention;Following commands;Safety/judgement;Awareness;Problem solving Orientation Level: Disoriented to;Place;Situation;Time Current Attention Level: Focused Following Commands: Follows one step commands  inconsistently Safety/Judgement: Decreased awareness of safety Awareness: Intellectual Problem Solving: Slow processing;Requires verbal cues;Requires tactile cues;Decreased initiation General Comments: Unable to assess cognition today as patient unable to talk and having periods of mumbling, not responsive and in a catatonic state. Difficult to assess due to: Impaired communication    Extremity/Trunk Assessment Right Lower Extremity Assessment RLE ROM/Strength/Tone: Within functional levels Left Lower Extremity Assessment LLE ROM/Strength/Tone: Within functional levels Trunk Assessment Trunk Assessment: Normal     Mobility Bed Mobility Bed Mobility: Supine to Sit Details for Bed Mobility Assistance: cues to initiate movement. Pt becomes unresponsive at times and needs verbal and tactile cues for initiation of movement. Transfers Sit to Stand: 5: Supervision;With upper extremity assist;From bed Stand to Sit: 5: Supervision;With upper extremity assist;To bed     Balance Balance Balance Assessed: Yes Static Standing Balance Static Standing - Balance Support: No upper extremity supported Static Standing - Level of Assistance: 5: Stand by assistance   End of Session OT - End of Session Patient left: in bed;with call bell/phone within reach;with bed alarm set Nurse Communication: Mobility status;Precautions  GO     Tyreesha Maharaj 03/06/2013, 9:50 AM

## 2013-03-06 NOTE — Discharge Summary (Signed)
Physician Discharge Summary  Diane [name]:[id number] DOB: November 09, 1955 DOA: 03/02/2013  PCP: [name], MD  Admit date: 03/02/2013 Discharge date: 03/06/2013  Time spent: Greater than 30 minutes  Recommendations for Outpatient Follow-up:  -Advised to followup with primary care provider in 2 weeks. -Will also need followup with neurologist within 2 weeks. Neurology consultation in the hospital is recommending a long term awake and asleep EEG for the patient and can be arranged by outpatient neurologist.   Discharge Diagnoses:  Principal Problem:   Partial seizures Active Problems:   DIABETES MELLITUS, TYPE II   TOBACCO USE   OBSTRUCTIVE SLEEP APNEA   HYPERTENSION   CORONARY ARTERY DISEASE   CVA   Discharge Condition: Stable   Filed Weights   03/04/13 0400 03/05/13 0400 03/06/13 0419  Weight: 67.5 kg (148 lb 13 oz) 68.2 kg (150 lb 5.7 oz) 66.4 kg (146 lb 6.2 oz)    History of present illness:  Patient is a 57 y.o. female was brought to the ER after patient was witnessed to have increasing staring spells at home. Patient had similar episodes in the ER twice once witnessed by the ER physician. As per the ER physician patient was staring towards the right side for a few seconds. Patient did not lose consciousness after the incident. CT head did not show anything acute and patient did not have any acute focal deficits. Neurologist on-call Dr. Roseanne Reno was consulted and at this time patient was started on Keppra for possible partial seizures. Patient otherwise denies any headache nausea vomiting abdominal pain chest pain or shortness of breath. We were asked to admit her for further evaluation and management.   Hospital Course:   Transient episodes of aphasia and fixed gaze  -Patient had multiple of these episodes while hooked up to the continuous 24-hour EEG monitor. On only one occasion does the neurologist describe possible seizure activity.  -These episodes seem to be more frequent  when patient is stressed out or anxious. I suspect that some of these episodes may indeed be psychogenic in origin. I have discussed this via telephone with neurology, Dr. Leroy Kennedy, and he agrees with my assessment.  -Psychiatric consultation was requested, he agrees that there may be a component of psychogenic seizures. He is not recommending acute inpatient psychiatric hospitalization and believes that patient has medical decision capacity. -Appreciate neurology consultation.  -Has been started on Keppra 750 mg BID. -MRI with concern for a small infarct, however neurology believes this is more of an artifact.   Uncontrolled type 2 diabetes  -CBGs uncontrolled  -Lantus has been increased to 15 units. Will need further titration of Lantus in the outpatient setting.  Tobacco abuse  -Counseled on cessation.   Hypertension  -Well controlled.   Procedures:  24-hour EEG   Consultations:  Neurology, Dr. Leroy Kennedy, Dr. Pearlean Brownie  Discharge Instructions  Discharge Orders   Future Appointments Provider Department Dept Phone   06/09/2013 12:00 PM [facility] [place] [phone]   06/16/2013 10:30 AM Judy Pimple, MD [facility] [phone]   Future Orders Complete By Expires     Diet - low sodium heart healthy  As directed     Discontinue IV  As directed     Increase activity slowly  As directed         Medication List    STOP taking these medications       albuterol 108 (90 BASE) MCG/ACT inhaler  Commonly known as:  PROAIR HFA     sodium chloride 0.65 % nasal spray  Commonly known as:  [employer] SALINE NASAL SPRAY      TAKE these medications       albuterol (2.5 MG/3ML) 0.083% nebulizer solution  Commonly known as:  PROVENTIL  Take 3 mLs (2.5 mg total) by nebulization every 6 (six) hours as needed.     aspirin 325 MG tablet  Take 1 tablet (325 mg total) by mouth daily.     carvedilol 12.5 MG tablet  Commonly known as:  COREG  Take  12.5 mg by mouth 2 (two) times daily with a meal.     cetirizine 10 MG tablet  Commonly known as:  ZYRTEC  Take 1 tablet (10 mg total) by mouth daily.     clopidogrel 75 MG tablet  Commonly known as:  PLAVIX  Take 75 mg by mouth daily.     [employer]S NATURAL FISH OIL 1200 MG Caps  Take 1 capsule by mouth 2 (two) times daily.     cyclobenzaprine 10 MG tablet  Commonly known as:  FLEXERIL  Take 10 mg by mouth as needed.     dexlansoprazole 60 MG capsule  Commonly known as:  DEXILANT  Take 1 capsule (60 mg total) by mouth daily.     docusate sodium 100 MG capsule  Commonly known as:  COLACE  Take 1 capsule (100 mg total) by mouth 2 (two) times daily.     guaiFENesin 600 MG 12 hr tablet  Commonly known as:  MUCINEX  Take 2 tablets (1,200 mg total) by mouth 2 (two) times daily.     hydrochlorothiazide 50 MG tablet  Commonly known as:  HYDRODIURIL  Take 50 mg by mouth daily.     insulin glargine 100 UNIT/ML injection  Commonly known as:  LANTUS  Inject 0.15 mLs (15 Units total) into the skin at bedtime.     isosorbide mononitrate 60 MG 24 hr tablet  Commonly known as:  IMDUR  TAKE 1 TABLET (60MG  TOTAL) BY MOUTH TWICE A DAY     KLOR-CON M10 10 MEQ tablet  Generic drug:  potassium chloride  TAKE 1 TABLET TWICE DAILY     levETIRAcetam 750 MG tablet  Commonly known as:  KEPPRA  Take 1 tablet (750 mg total) by mouth 2 (two) times daily.     losartan 100 MG tablet  Commonly known as:  COZAAR  Take 1 tablet (100 mg total) by mouth daily.     nitroGLYCERIN 0.4 MG SL tablet  Commonly known as:  NITROSTAT  Place 1 tablet (0.4 mg total) under the tongue every 5 (five) minutes as needed.     ranolazine 500 MG 12 hr tablet  Commonly known as:  RANEXA  Take 500 mg by mouth 2 (two) times daily.     rosuvastatin 10 MG tablet  Commonly known as:  CRESTOR  Take 10 mg by mouth daily.     traMADol 50 MG tablet  Commonly known as:  ULTRAM  Take 50 mg by mouth 2 (two) times daily.        Allergies  Allergen Reactions  . Ace Inhibitors     REACTION: cough  . Tetracycline     REACTION: reaction not known       Follow-up Information   Follow up with [name], MD. Schedule an appointment as soon as possible for a visit in 2 weeks.   Contact information:   [address] [place] [address] [phone]        The results of significant diagnostics from this hospitalization (including imaging, microbiology, ancillary and laboratory) are listed below for reference.    Significant Diagnostic Studies: Ct Head (brain) Wo Contrast  03/03/2013   *RADIOLOGY REPORT*  Clinical Data: Slurred speech.  Right-sided facial droop.  CT HEAD WITHOUT CONTRAST  Technique:  Contiguous axial images were obtained from the base of the skull through the vertex without contrast.  Comparison: Head CT 11/02/2010.  Findings: Well defined area of decreased attenuation in the right frontoparietal region is unchanged, compatible with encephalomalacia.  No acute intracranial abnormality. Specifically, no definite signs of acute/subacute cerebral ischemia, no evidence of acute intracranial hemorrhage, no mass, mass effect or hydrocephalous.  Mild decreased attenuation in the periventricular white matter of the cerebral hemispheres bilaterally, compatible with mild chronic microvascular ischemic disease.  No acute displaced skull fractures are identified. Visualized paranasal sinuses and mastoids are well pneumatized.  IMPRESSION: 1.  No acute intracranial abnormalities. 2.  Encephalomalacia in the right frontoparietal region related to prior infarction. 3.  Mild chronic microvascular ischemic disease of the periventricular white matter of the cerebral hemispheres bilaterally.   Original Report Authenticated By: Trudie Reed, M.D.   Mr Brain Wo Contrast  03/03/2013   *RADIOLOGY REPORT*  Clinical Data: Staring spells (question seizures).  Hypertensive hyperlipidemic  diabetic patient.  MRI HEAD WITHOUT CONTRAST  Technique:  Multiplanar, multiecho pulse sequences of the brain and surrounding structures were obtained according to standard protocol without intravenous contrast.  Comparison: 03/02/2013 and 07/26/2010 CT.  No comparison MR.  Findings: Motion degraded exam.  Tiny acute non hemorrhagic posterior left frontal lobe infarct.  Moderate size area of encephalomalacia in the right parietal/periatrial region unchanged from prior CTs and most likely related to result of prior infarct.  No intracranial hemorrhage.  Mild small vessel disease type changes.  In the right hippocampal region, tiny cysts are noted probably incidental finding rather than primary mass or evidence of mesial temporal sclerosis.  Global atrophy without hydrocephalus.  Major intracranial vascular structures are patent. Ectatic basilar artery impresses the undersurface of the hypothalamic region.  Cervical medullary junction, pituitary region, pineal region and orbital structures unremarkable.  IMPRESSION:  Tiny acute non hemorrhagic posterior left frontal lobe infarct.  Please see above.  This has been made a PRA call report utilizing dashboard call feature.   Original Report Authenticated By: Lacy Duverney, M.D.    Microbiology: No results found for this or any previous visit (from the past 240 hour(s)).   Labs: Basic Metabolic Panel:  Recent Labs Lab 03/02/13 2313 03/03/13 0535  NA 133* 133*  K 4.2 3.3*  CL 93* 94*  CO2 30 29  GLUCOSE 477* 273*  BUN 9 7  CREATININE 0.55 0.42*  CALCIUM 9.8 9.7   Liver Function Tests:  Recent Labs Lab 03/02/13 2313  AST 18  ALT 18  ALKPHOS 111  BILITOT 0.2*  PROT 6.9  ALBUMIN 3.7   No results found for this basename: LIPASE, AMYLASE,  in the last 168 hours No results found for this basename: AMMONIA,  in the last 168 hours CBC:  Recent Labs Lab 03/02/13 2313 03/03/13 0535  WBC 6.1 6.3  NEUTROABS 3.2  --   HGB 12.0 12.1  HCT 37.8  38.4  MCV 69.6* 69.2*  PLT 255 239   Cardiac Enzymes:  Recent Labs Lab 03/02/13 2313  TROPONINI <0.30   BNP: BNP (last 3 results) No  results found for this basename: PROBNP,  in the last 8760 hours CBG:  Recent Labs Lab 03/05/13 1149 03/05/13 1637 03/05/13 2120 03/06/13 0724 03/06/13 1135  GLUCAP 313* 277* 349* 276* 304*       Signed:  [name],Nettie Cromwell  [facility] Pager: [phone] 03/06/2013, 12:09 PM

## 2013-03-06 NOTE — Progress Notes (Signed)
Clinical Social Work Department BRIEF PSYCHOSOCIAL ASSESSMENT 03/06/2013  Patient:  Diane [name], Diane [name]     Account Number:  [id number]     Admit date:  03/02/2013  Clinical Social Worker:  Kirke Shaggy  Date/Time:  03/06/2013 11:22 AM  Referred by:  Physician  Date Referred:  03/05/2013 Referred for  SNF Placement   Other Referral:   Interview type:  Patient Other interview type:   CSW spoke with pt's Sister over the phone to do the assessment for the pt.    PSYCHOSOCIAL DATA Living Status:  ALONE Admitted from facility:   Level of care:   Primary support name:  Diane [name] Primary support relationship to patient:  FAMILY Degree of support available:   The support is pretty good. There are family members that live near the pt's apt.    CURRENT CONCERNS Current Concerns  Post-Acute Placement   Other Concerns:   Pt will need Rehab for SLP, PT & OT.    SOCIAL WORK ASSESSMENT / PLAN CSW met with the pt at bedside to do assessment. The pt was awake, appeared alert and quiet. Pt answered CSW questions apprpropriately. Affect is appropriate and thought process is linear.   Assessment/plan status:  No Further Intervention Required Other assessment/ plan:   Information/referral to community resources:   Gave the pt list of [facility].    PATIENT'S/FAMILY'S RESPONSE TO PLAN OF CARE: Pt and her sister are in agreement for SNF placement.   Sherald Barge, LCSW-A Clinical Social Worker [phone]

## 2013-03-06 NOTE — Progress Notes (Addendum)
Rehab Admissions Coordinator Note:  Patient was screened by Clois Dupes for appropriateness for an Inpatient Acute Rehab Consult.  At this time, we are recommending HH and assist of family or SNF if supports not in place for d/c home.  Clois Dupes 03/06/2013, 11:16 AM  I can be reached at [phone].

## 2013-03-06 NOTE — Clinical Social Work Placement (Addendum)
Deleted document.  Entered on wrong patient.  Vickii Penna, LCSWA [phone]  Clinical Social Work

## 2013-03-06 NOTE — Progress Notes (Signed)
D/c orders received;IV removed with gauze on, pt remains in stable condition, pt meds and instructions reviewed and given to pt; pt d/c to [facility], report called to [place]; pt transported via ambulance

## 2013-03-06 NOTE — Progress Notes (Signed)
Inpatient Diabetes Program Recommendations  AACE/ADA: New Consensus Statement on Inpatient Glycemic Control (2013)  Target Ranges:  Prepandial:   less than 140 mg/dL      Peak postprandial:   less than 180 mg/dL (1-2 hours)      Critically ill patients:  140 - 180 mg/dL     Results for ZAELYN, NOACK (MRN [id number]) as of 03/06/2013 10:18  Ref. Range 03/05/2013 07:21 03/05/2013 11:49 03/05/2013 16:37 03/05/2013 21:20  Glucose-Capillary Latest Range: 70-99 mg/dL 308 (H) 657 (H) 846 (H) 349 (H)    **Noted Lantus increased to 15 units QHS last night  **Glucose levels still elevated this morning  Recommend the following insulin adjustments: 1. Increase Lantus to 20 units QHS 2. Increase meal coverage- Novolog 5 units tid with meals  **Noted A1c was 12.8% (03/03/13)- Also noted patient may need SNF or Inpatient Rehab once ready for d/c-  Patient will likely need insulin for home since A1c so elevated- Will ask RNs to provide insulin instruction for this patient just in case decision made to send patient home on insulin  Will follow. Ambrose Finland RN, MSN, CDE Diabetes Coordinator Inpatient Diabetes Program [phone]

## 2013-03-06 NOTE — Evaluation (Signed)
Physical Therapy Evaluation Patient Details Name: Diane [name] MRN: [id number] DOB: 07/11/1956 Today's Date: 03/06/2013 Time:  -     PT Assessment / Plan / Recommendation Clinical Impression  Pt is a 57 yo lady who presents with psychogenic seizures. Pt responds appropriately at times and then will have multiple episodes of mumbling, fixed gaze, and not following commands. Do not feel pt is safe to d/c home alone. Pt needs close supervision and or min assist during one of her episodes. Noted psych consult recommending outpatient services. Pt may benefit from a CIR stay to focus on increasing independence and providing psychological support. Pt would also benefit from speech therapy services.    PT Assessment  Patient needs continued PT services    Follow Up Recommendations  Supervision/Assistance - 24 hour;CIR    Does the patient have the potential to tolerate intense rehabilitation      Barriers to Discharge Decreased caregiver support      Equipment Recommendations  None recommended by PT    Recommendations for Other Services Rehab consult;Speech consult   Frequency Min 3X/week    Precautions / Restrictions Precautions Precautions: Fall Restrictions Weight Bearing Restrictions: No   Pertinent Vitals/Pain       Mobility  Bed Mobility Bed Mobility: Supine to Sit Details for Bed Mobility Assistance: cues to initiate movement. Pt becomes unresponsive at times and needs verbal and tactile cues for initiation of movement. Transfers Transfers: Sit to Stand;Stand to Sit Sit to Stand: 5: Supervision;With upper extremity assist;From bed Stand to Sit: 5: Supervision;With upper extremity assist;To bed Ambulation/Gait Ambulation/Gait Assistance: 4: Min assist Ambulation Distance (Feet): 120 Feet Assistive device: None Ambulation/Gait Assistance Details: pt ambulated without difficulty for the first 75 feet. Pt then had a flat affect and was not able to respond and follow  commands. Pt did not have a lose of balance, but needed to be guided by tactile and verbal cues to continue walking to room. Pt then became verbal again and was able to return to her room and bed at a supervision level.  Gait Pattern: Step-through pattern Gait velocity: decreased Stairs: No    Exercises     PT Diagnosis: Difficulty walking;Altered mental status  PT Problem List: Decreased activity tolerance;Decreased mobility;Decreased cognition;Decreased safety awareness;Decreased knowledge of precautions PT Treatment Interventions: Gait training;Therapeutic activities;Therapeutic exercise;Balance training;Cognitive remediation;Patient/family education   PT Goals Acute Rehab PT Goals PT Goal Formulation: Patient unable to participate in goal setting Time For Goal Achievement: 03/20/13 Potential to Achieve Goals: Good Pt will go Supine/Side to Sit: Independently PT Goal: Supine/Side to Sit - Progress: Goal set today Pt will go Sit to Stand: Independently PT Goal: Sit to Stand - Progress: Goal set today Pt will Transfer Bed to Chair/Chair to Bed: Independently PT Transfer Goal: Bed to Chair/Chair to Bed - Progress: Goal set today Pt will Ambulate: >150 feet;Independently PT Goal: Ambulate - Progress: Goal set today  Visit Information  Last PT Received On: 03/06/13 Assistance Needed: +1    Subjective Data  Subjective: "I have no concerns over going home." Patient Stated Goal: to return home.   Prior Functioning  Home Living Lives With: Alone Type of Home: House Home Access: Level entry Home Layout: One level Home Adaptive Equipment: None Prior Function Level of Independence: Independent Able to Take Stairs?: Yes Driving: Yes Vocation: On disability Communication Communication: No difficulties    Cognition  Cognition Arousal/Alertness: Awake/alert Overall Cognitive Status: No family/caregiver present to determine baseline cognitive functioning Area of Impairment:  Orientation;Attention;Following commands;Safety/judgement;Awareness;Problem solving Orientation Level: Disoriented to;Place;Situation;Time Current Attention Level: Focused Following Commands: Follows one step commands inconsistently Safety/Judgement: Decreased awareness of safety Awareness: Intellectual Problem Solving: Slow processing;Requires verbal cues;Requires tactile cues;Decreased initiation General Comments: at first the pt answered my questions without difficulty. Then pt began to [facility] and was not able to communicate thougths. Pt appeared to have a flat affect and did not respond to commands for 15-20 secs. With verbal and tactile cues pt was able to recover.  Difficult to assess due to: Impaired communication    Extremity/Trunk Assessment Right Lower Extremity Assessment RLE ROM/Strength/Tone: Within functional levels Left Lower Extremity Assessment LLE ROM/Strength/Tone: Within functional levels Trunk Assessment Trunk Assessment: Normal   Balance Balance Balance Assessed: Yes Static Standing Balance Static Standing - Balance Support: No upper extremity supported Static Standing - Level of Assistance: 5: Stand by assistance  End of Session PT - End of Session Equipment Utilized During Treatment: Gait belt Activity Tolerance: Other (comment) (limited by episodes of fixed gaze and inaudible words) Patient left: in bed;with bed alarm set;with call bell/phone within reach Nurse Communication: Mobility status  GP     Greggory Stallion 03/06/2013, 9:12 AM

## 2013-03-06 NOTE — Progress Notes (Signed)
Clinical Social Work Department CLINICAL SOCIAL WORK PLACEMENT NOTE 03/06/2013  Patient:  Diane [name], Diane [name]  Account Number:  [id number] Admit date:  03/02/2013  Clinical Social Worker:  Kirke Shaggy  Date/time:  03/06/2013 11:44 AM  Clinical Social Work is seeking post-discharge placement for this patient at the following level of care:   SKILLED NURSING   (*CSW will update this form in Epic as items are completed)   03/06/2013  Patient/family provided with [facility] Department of Clinical Social Work's list of facilities offering this level of care within the geographic area requested by the patient (or if unable, by the patient's family).  03/06/2013  Patient/family informed of their freedom to choose among providers that offer the needed level of care, that participate in Medicare, Medicaid or managed care program needed by the patient, have an available bed and are willing to accept the patient.  03/06/2013  Patient/family informed of [facility]' ownership interest in [facility], as well as of the fact that they are under no obligation to receive care at this facility.  PASARR submitted to EDS on 03/06/2013 PASARR number received from EDS on 03/06/2013  FL2 transmitted to all facilities in geographic area requested by pt/family on  03/06/2013 FL2 transmitted to all facilities within larger geographic area on 03/06/2013  Patient informed that his/her managed care company has contracts with or will negotiate with  certain facilities, including the following:     Patient/family informed of bed offers received:   Patient chooses bed at  Physician recommends and patient chooses bed at    Patient to be transferred to  on   Patient to be transferred to facility by   Additional Comments:   Sherald Barge, LCSW-A Clinical Social Worker [phone]

## 2013-03-09 DIAGNOSIS — I2510 Atherosclerotic heart disease of native coronary artery without angina pectoris: Secondary | ICD-10-CM | POA: Diagnosis not present

## 2013-03-09 DIAGNOSIS — R569 Unspecified convulsions: Secondary | ICD-10-CM | POA: Diagnosis not present

## 2013-03-09 DIAGNOSIS — I10 Essential (primary) hypertension: Secondary | ICD-10-CM | POA: Diagnosis not present

## 2013-03-09 DIAGNOSIS — E119 Type 2 diabetes mellitus without complications: Secondary | ICD-10-CM | POA: Diagnosis not present

## 2013-03-12 DIAGNOSIS — F411 Generalized anxiety disorder: Secondary | ICD-10-CM | POA: Diagnosis not present

## 2013-03-12 DIAGNOSIS — IMO0001 Reserved for inherently not codable concepts without codable children: Secondary | ICD-10-CM | POA: Diagnosis not present

## 2013-03-12 DIAGNOSIS — R569 Unspecified convulsions: Secondary | ICD-10-CM | POA: Diagnosis not present

## 2013-03-14 ENCOUNTER — Inpatient Hospital Stay (HOSPITAL_COMMUNITY)
Admission: EM | Admit: 2013-03-14 | Discharge: 2013-03-18 | DRG: 101 | Disposition: A | Discharge status: Skilled Nursing Facility | Payer: Medicare Other | Attending: Internal Medicine | Admitting: Internal Medicine

## 2013-03-14 ENCOUNTER — Encounter (HOSPITAL_COMMUNITY): Payer: Self-pay | Admitting: *Deleted

## 2013-03-14 ENCOUNTER — Emergency Department (HOSPITAL_COMMUNITY): Payer: Medicare Other

## 2013-03-14 DIAGNOSIS — J42 Unspecified chronic bronchitis: Secondary | ICD-10-CM

## 2013-03-14 DIAGNOSIS — I6350 Cerebral infarction due to unspecified occlusion or stenosis of unspecified cerebral artery: Secondary | ICD-10-CM

## 2013-03-14 DIAGNOSIS — J309 Allergic rhinitis, unspecified: Secondary | ICD-10-CM

## 2013-03-14 DIAGNOSIS — F411 Generalized anxiety disorder: Secondary | ICD-10-CM

## 2013-03-14 DIAGNOSIS — E119 Type 2 diabetes mellitus without complications: Secondary | ICD-10-CM | POA: Diagnosis not present

## 2013-03-14 DIAGNOSIS — R1310 Dysphagia, unspecified: Secondary | ICD-10-CM | POA: Diagnosis present

## 2013-03-14 DIAGNOSIS — R1319 Other dysphagia: Secondary | ICD-10-CM

## 2013-03-14 DIAGNOSIS — I6789 Other cerebrovascular disease: Secondary | ICD-10-CM | POA: Diagnosis not present

## 2013-03-14 DIAGNOSIS — G9389 Other specified disorders of brain: Secondary | ICD-10-CM | POA: Diagnosis not present

## 2013-03-14 DIAGNOSIS — IMO0001 Reserved for inherently not codable concepts without codable children: Secondary | ICD-10-CM | POA: Diagnosis present

## 2013-03-14 DIAGNOSIS — I10 Essential (primary) hypertension: Secondary | ICD-10-CM

## 2013-03-14 DIAGNOSIS — E785 Hyperlipidemia, unspecified: Secondary | ICD-10-CM | POA: Diagnosis present

## 2013-03-14 DIAGNOSIS — E876 Hypokalemia: Secondary | ICD-10-CM | POA: Diagnosis present

## 2013-03-14 DIAGNOSIS — Z9861 Coronary angioplasty status: Secondary | ICD-10-CM | POA: Diagnosis not present

## 2013-03-14 DIAGNOSIS — G40109 Localization-related (focal) (partial) symptomatic epilepsy and epileptic syndromes with simple partial seizures, not intractable, without status epilepticus: Principal | ICD-10-CM | POA: Diagnosis present

## 2013-03-14 DIAGNOSIS — G43701 Chronic migraine without aura, not intractable, with status migrainosus: Secondary | ICD-10-CM | POA: Diagnosis not present

## 2013-03-14 DIAGNOSIS — I779 Disorder of arteries and arterioles, unspecified: Secondary | ICD-10-CM

## 2013-03-14 DIAGNOSIS — G4733 Obstructive sleep apnea (adult) (pediatric): Secondary | ICD-10-CM | POA: Diagnosis present

## 2013-03-14 DIAGNOSIS — R262 Difficulty in walking, not elsewhere classified: Secondary | ICD-10-CM | POA: Diagnosis not present

## 2013-03-14 DIAGNOSIS — R488 Other symbolic dysfunctions: Secondary | ICD-10-CM | POA: Diagnosis not present

## 2013-03-14 DIAGNOSIS — R569 Unspecified convulsions: Secondary | ICD-10-CM | POA: Diagnosis present

## 2013-03-14 DIAGNOSIS — K219 Gastro-esophageal reflux disease without esophagitis: Secondary | ICD-10-CM

## 2013-03-14 DIAGNOSIS — I69928 Other speech and language deficits following unspecified cerebrovascular disease: Secondary | ICD-10-CM | POA: Diagnosis not present

## 2013-03-14 DIAGNOSIS — N179 Acute kidney failure, unspecified: Secondary | ICD-10-CM | POA: Diagnosis present

## 2013-03-14 DIAGNOSIS — R42 Dizziness and giddiness: Secondary | ICD-10-CM

## 2013-03-14 DIAGNOSIS — G40401 Other generalized epilepsy and epileptic syndromes, not intractable, with status epilepticus: Secondary | ICD-10-CM | POA: Diagnosis not present

## 2013-03-14 DIAGNOSIS — Z1231 Encounter for screening mammogram for malignant neoplasm of breast: Secondary | ICD-10-CM

## 2013-03-14 DIAGNOSIS — F17200 Nicotine dependence, unspecified, uncomplicated: Secondary | ICD-10-CM | POA: Diagnosis present

## 2013-03-14 DIAGNOSIS — I2510 Atherosclerotic heart disease of native coronary artery without angina pectoris: Secondary | ICD-10-CM | POA: Diagnosis not present

## 2013-03-14 DIAGNOSIS — Z951 Presence of aortocoronary bypass graft: Secondary | ICD-10-CM

## 2013-03-14 DIAGNOSIS — M6281 Muscle weakness (generalized): Secondary | ICD-10-CM | POA: Diagnosis not present

## 2013-03-14 DIAGNOSIS — I739 Peripheral vascular disease, unspecified: Secondary | ICD-10-CM | POA: Diagnosis present

## 2013-03-14 DIAGNOSIS — D509 Iron deficiency anemia, unspecified: Secondary | ICD-10-CM | POA: Diagnosis present

## 2013-03-14 DIAGNOSIS — I69998 Other sequelae following unspecified cerebrovascular disease: Secondary | ICD-10-CM | POA: Diagnosis not present

## 2013-03-14 DIAGNOSIS — E871 Hypo-osmolality and hyponatremia: Secondary | ICD-10-CM

## 2013-03-14 DIAGNOSIS — G43909 Migraine, unspecified, not intractable, without status migrainosus: Secondary | ICD-10-CM | POA: Diagnosis present

## 2013-03-14 DIAGNOSIS — Z8673 Personal history of transient ischemic attack (TIA), and cerebral infarction without residual deficits: Secondary | ICD-10-CM | POA: Diagnosis not present

## 2013-03-14 DIAGNOSIS — E049 Nontoxic goiter, unspecified: Secondary | ICD-10-CM

## 2013-03-14 DIAGNOSIS — G40901 Epilepsy, unspecified, not intractable, with status epilepticus: Secondary | ICD-10-CM

## 2013-03-14 DIAGNOSIS — F329 Major depressive disorder, single episode, unspecified: Secondary | ICD-10-CM

## 2013-03-14 HISTORY — DX: Unspecified convulsions: R56.9

## 2013-03-14 LAB — CBC
HCT: 35.7 % — ABNORMAL LOW (ref 36.0–46.0)
HCT: 35.6 % — ABNORMAL LOW (ref 36.0–46.0)
Hemoglobin: 11.1 g/dL — ABNORMAL LOW (ref 12.0–15.0)
Hemoglobin: 10.9 g/dL — ABNORMAL LOW (ref 12.0–15.0)
MCH: 22.2 pg — ABNORMAL LOW (ref 26.0–34.0)
MCH: 22 pg — ABNORMAL LOW (ref 26.0–34.0)
MCHC: 31.1 g/dL (ref 30.0–36.0)
MCHC: 30.6 g/dL (ref 30.0–36.0)
MCV: 71.3 fL — ABNORMAL LOW (ref 78.0–100.0)
MCV: 71.9 fL — ABNORMAL LOW (ref 78.0–100.0)
Platelets: 228 10*3/uL (ref 150–400)
Platelets: 210 10*3/uL (ref 150–400)
RBC: 5.01 MIL/uL (ref 3.87–5.11)
RBC: 4.95 MIL/uL (ref 3.87–5.11)
RDW: 18.4 % — ABNORMAL HIGH (ref 11.5–15.5)
RDW: 18.5 % — ABNORMAL HIGH (ref 11.5–15.5)
WBC: 8 10*3/uL (ref 4.0–10.5)
WBC: 11 10*3/uL — ABNORMAL HIGH (ref 4.0–10.5)

## 2013-03-14 LAB — COMPREHENSIVE METABOLIC PANEL
ALT: 14 U/L (ref 0–35)
AST: 13 U/L (ref 0–37)
Albumin: 2.8 g/dL — ABNORMAL LOW (ref 3.5–5.2)
Alkaline Phosphatase: 73 U/L (ref 39–117)
BUN: 28 mg/dL — ABNORMAL HIGH (ref 6–23)
CO2: 23 mEq/L (ref 19–32)
Calcium: 8.8 mg/dL (ref 8.4–10.5)
Chloride: 104 mEq/L (ref 96–112)
Creatinine, Ser: 0.65 mg/dL (ref 0.50–1.10)
GFR calc Af Amer: >90 mL/min (ref >90)
GFR calc non Af Amer: >90 mL/min (ref >90)
Glucose, Bld: 161 mg/dL — ABNORMAL HIGH (ref 70–99)
Potassium: 2.9 mEq/L — ABNORMAL LOW (ref 3.5–5.1)
Sodium: 140 mEq/L (ref 135–145)
Total Bilirubin: 0.3 mg/dL (ref 0.3–1.2)
Total Protein: 6 g/dL (ref 6.0–8.3)

## 2013-03-14 LAB — POCT I-STAT 3, ART BLOOD GAS (G3+)
Acid-base deficit: 3 mmol/L — ABNORMAL HIGH (ref 0.0–2.0)
Bicarbonate: 22.7 mEq/L (ref 20.0–24.0)
O2 Saturation: 99 %
Patient temperature: 97.3
TCO2: 24 mmol/L (ref 0–100)
pCO2 arterial: 38.5 mmHg (ref 35.0–45.0)
pH, Arterial: 7.374 (ref 7.350–7.450)
pO2, Arterial: 118 mmHg — ABNORMAL HIGH (ref 80.0–100.0)

## 2013-03-14 LAB — GLUCOSE, CAPILLARY
Glucose-Capillary: 156 mg/dL — ABNORMAL HIGH (ref 70–99)
Glucose-Capillary: 124 mg/dL — ABNORMAL HIGH (ref 70–99)

## 2013-03-14 LAB — ETHANOL: Alcohol, Ethyl (B): <11 mg/dL (ref 0–11)

## 2013-03-14 LAB — MAGNESIUM: Magnesium: 1.5 mg/dL (ref 1.5–2.5)

## 2013-03-14 LAB — CREATININE, SERUM
Creatinine, Ser: 0.5 mg/dL (ref 0.50–1.10)
GFR calc Af Amer: >90 mL/min (ref >90)
GFR calc non Af Amer: >90 mL/min (ref >90)

## 2013-03-14 LAB — AMMONIA: Ammonia: 36 umol/L (ref 11–60)

## 2013-03-14 LAB — PHOSPHORUS: Phosphorus: 3.5 mg/dL (ref 2.3–4.6)

## 2013-03-14 LAB — MRSA PCR SCREENING: MRSA by PCR: NEGATIVE

## 2013-03-14 MED ORDER — SODIUM CHLORIDE 0.9 % IV SOLN
1000.0000 mg | Freq: Once | INTRAVENOUS | Status: AC
  Administered 2013-03-14: 1000 mg via INTRAVENOUS
  Filled 2013-03-14: qty 20

## 2013-03-14 MED ORDER — LORAZEPAM 2 MG/ML IJ SOLN
INTRAMUSCULAR | Status: AC
  Administered 2013-03-14: 2 mg via INTRAVENOUS
  Filled 2013-03-14: qty 1

## 2013-03-14 MED ORDER — POTASSIUM CHLORIDE 10 MEQ/100ML IV SOLN: 10.0000 meq | Freq: Once | INTRAVENOUS | Status: DC

## 2013-03-14 MED ORDER — METOPROLOL TARTRATE 1 MG/ML IV SOLN
2.5000 mg | Freq: Four times a day (QID) | INTRAVENOUS | Status: DC
  Administered 2013-03-14 – 2013-03-16 (×8): 2.5 mg via INTRAVENOUS
  Filled 2013-03-14 (×11): qty 5

## 2013-03-14 MED ORDER — SODIUM CHLORIDE 0.9 % IV SOLN
1000.0000 mg | Freq: Once | INTRAVENOUS | Status: AC
  Administered 2013-03-14: 1000 mg via INTRAVENOUS
  Filled 2013-03-14: qty 10

## 2013-03-14 MED ORDER — ASPIRIN 300 MG RE SUPP
300.0000 mg | Freq: Every day | RECTAL | Status: DC
  Administered 2013-03-14 – 2013-03-15 (×2): 300 mg via RECTAL
  Filled 2013-03-14 (×3): qty 1

## 2013-03-14 MED ORDER — PHENYTOIN SODIUM 50 MG/ML IJ SOLN
100.0000 mg | Freq: Three times a day (TID) | INTRAMUSCULAR | Status: DC
  Administered 2013-03-14 – 2013-03-17 (×8): 100 mg via INTRAVENOUS
  Filled 2013-03-14 (×12): qty 2

## 2013-03-14 MED ORDER — POTASSIUM CHLORIDE 10 MEQ/100ML IV SOLN
10.0000 meq | INTRAVENOUS | Status: AC
  Administered 2013-03-14 (×4): 10 meq via INTRAVENOUS
  Filled 2013-03-14 (×2): qty 100
  Filled 2013-03-14: qty 200

## 2013-03-14 MED ORDER — PHENYTOIN SODIUM 50 MG/ML IJ SOLN
100.0000 mg | Freq: Three times a day (TID) | INTRAMUSCULAR | Status: DC
  Administered 2013-03-14: 100 mg via INTRAVENOUS

## 2013-03-14 MED ORDER — LORAZEPAM 2 MG/ML IJ SOLN
4.0000 mg | INTRAMUSCULAR | Status: AC | PRN
  Administered 2013-03-14: 4 mg via INTRAVENOUS
  Filled 2013-03-14: qty 2

## 2013-03-14 MED ORDER — SODIUM CHLORIDE 0.9 % IV SOLN
INTRAVENOUS | Status: AC
  Administered 2013-03-14: 15:00:00 via INTRAVENOUS

## 2013-03-14 MED ORDER — SODIUM CHLORIDE 0.9 % IJ SOLN
3.0000 mL | Freq: Two times a day (BID) | INTRAMUSCULAR | Status: DC
  Administered 2013-03-14 – 2013-03-15 (×4): 3 mL via INTRAVENOUS
  Administered 2013-03-16: via INTRAVENOUS
  Administered 2013-03-16 – 2013-03-18 (×4): 3 mL via INTRAVENOUS

## 2013-03-14 MED ORDER — HEPARIN SODIUM (PORCINE) 5000 UNIT/ML IJ SOLN
5000.0000 [IU] | Freq: Three times a day (TID) | INTRAMUSCULAR | Status: DC
  Administered 2013-03-14 – 2013-03-18 (×12): 5000 [IU] via SUBCUTANEOUS
  Filled 2013-03-14 (×15): qty 1

## 2013-03-14 MED ORDER — BIOTENE DRY MOUTH MT LIQD
15.0000 mL | Freq: Two times a day (BID) | OROMUCOSAL | Status: DC
  Administered 2013-03-14 – 2013-03-18 (×8): 15 mL via OROMUCOSAL

## 2013-03-14 NOTE — ED Notes (Signed)
Per EMS:  Pt from [facility].  Reports that she has had multiple seizures since 1am.  Pt received 1 mg ativan IM PTA.  EMS administered 5mg  versed en route.  Pt had 4 witnessed (R) sided seizures that last 30 seconds en route.  Pt also witnessed to have 1 (R) sided seizure lasting 30 seconds on arrival to ED.

## 2013-03-14 NOTE — H&P (Addendum)
[facility] History and Physical  Diane [name] NGE:[id number] DOB: 1956-07-12 DOA: 03/14/2013  Referring physician: Jeraldine Loots PCP: [name], MD  Specialists: Neurology-Dr. Corene Cornea by Dr. Edward Qualia for bronchitis, OSA-CPAP qhs Follows Dr. [place] [facility] for [facility] h/o Follows Dr. Arbie Cookey for known Carotid artery disease  Chief Complaint: Status epilepticus  HPI: Diane [name] is a 57 y.o. female who was recnetly d/c to [facility].  Family was told that patient started to have seizure epiodes-they semed more frequent about 7-8 min apart.  She had been getting up but falling more at [facility] She seems to have declined according to family. Work-up last Hospital stay reviewed showing MRI with Subacute L corona radiata anomally, EEG showing one event of Theta activity ? Epileptiform changes.  Because of suspected , and non-epileptiform episodes, psych consult was obtained as well with suspected pseudoseizures-she was d/c home on Keppra 750 bid with potential plan to follow closely with neurology. She is unable to really give a history 2/2 to somnolence Usually she stays in an appt and her sister had noted she has had blank stares about 2 weeks prior to admission 03/03/13, Family notcied she would have spells wherein she would have blank stares lasting for short periods of time culminating in her ultimate admission on 03/03/13  In the Ed she was loaded with Keppra 1000 mg and was given 1mg  of ativan and 5 mg versed as well while being evaluated by me, she had 2 episodes lasitng about 30 seconds of gen shaking of Upper extremities, with a fixed stare and shaking.  I did not note any tongue biting and she returned to her relatively obtunded state after this Potassium 2.9 BUN/creatinine 28/0.65 Hemoglobin 11.1 CT head no acute intracranial abnormality  Review of Systems: unable to obtain  Past Medical History  Diagnosis Date  . Coronary artery disease     post CABG  in 3/07   . Hypertension   . Hyperlipidemia   . Dyslipidemia   . Diabetes mellitus     type 2  . Chronic bronchitis   . CVA (cerebral infarction) 1993  . Tobacco abuse   . COPD (chronic obstructive pulmonary disease)   . Stroke 1993   Admission 03/02/13 with Seizures-underwent work-uy0p c Prolonged EEG at that time H/o Hydrosalpinx followed by Dr. Pennie Rushing CV History: Posterior CVA in 1993 with loss of coordination and  balance, no lateralizing symptoms Cardiac: CABG x5 in 2007 followed by PTCA x2 in late 2007.  Past Surgical History  Procedure Laterality Date  . Coronary stent placement  08/11/06    PCI of her ciurcumflex/OM vessel  . Cardiac catheterization  11/29/05    EF of 55%  . Cardiac catheterization  08/06/06    EF of 45-50%  . Coronary artery bypass graft  12/04/2005    x5 -- left internal mammary artery to the LAD, left radial artery to the ramus intermedius, saphenous vein graft to the obtuse marginal 1, sequential saphenous vein grat to the acute marginal and posterior descending, endoscopic vein harvesting from the left thigh with open vein harvest from right leg   Social History:  reports that she has been smoking Cigarettes.  She has a 72 pack-year smoking history. She has never used smokeless tobacco. She reports that she drinks about 0.5 ounces of alcohol per week. She reports that she does not use illicit drugs.   Allergies  Allergen Reactions  . Ace Inhibitors     REACTION: cough  .  Tetracycline     REACTION: reaction not known    Family History  Problem Relation Age of Onset  . Coronary artery disease    . Heart disease    . Heart disease Mother   . Diabetes Mother   . COPD Mother   . Hyperlipidemia Mother   . Hypertension Mother   . Cancer Father   . Drug abuse Paternal Grandmother   . Stroke Paternal Grandfather    Prior to Admission medications   Medication Sig Start Date End Date Taking? Authorizing Provider  albuterol (PROVENTIL) (2.5 MG/3ML)  0.083% nebulizer solution Take 3 mLs (2.5 mg total) by nebulization every 6 (six) hours as needed. 08/26/12  Yes Coralyn Helling, MD  aspirin 325 MG tablet Take 1 tablet (325 mg total) by mouth daily. 08/13/12  Yes Judy Pimple, MD  carvedilol (COREG) 12.5 MG tablet Take 12.5 mg by mouth 2 (two) times daily with a meal.    Historical Provider, MD  cetirizine (ZYRTEC) 10 MG tablet Take 1 tablet (10 mg total) by mouth daily. 08/13/12   Judy Pimple, MD  clopidogrel (PLAVIX) 75 MG tablet Take 75 mg by mouth daily.    Historical Provider, MD  cyclobenzaprine (FLEXERIL) 10 MG tablet Take 10 mg by mouth as needed.      Historical Provider, MD  dexlansoprazole (DEXILANT) 60 MG capsule Take 1 capsule (60 mg total) by mouth daily. 06/13/12   Judy Pimple, MD  docusate sodium (COLACE) 100 MG capsule Take 1 capsule (100 mg total) by mouth 2 (two) times daily. 08/13/12 08/13/13  Judy Pimple, MD  guaiFENesin (MUCINEX) 600 MG 12 hr tablet Take 2 tablets (1,200 mg total) by mouth 2 (two) times daily. 08/13/12   Judy Pimple, MD  hydrochlorothiazide (HYDRODIURIL) 50 MG tablet Take 50 mg by mouth daily.    Historical Provider, MD  insulin glargine (LANTUS) 100 UNIT/ML injection Inject 0.15 mLs (15 Units total) into the skin at bedtime. 03/06/13   Henderson Cloud, MD  isosorbide mononitrate (IMDUR) 60 MG 24 hr tablet TAKE 1 TABLET (60MG  TOTAL) BY MOUTH TWICE A DAY 10/17/12   Peter M [place], MD  KLOR-CON M10 10 MEQ tablet TAKE 1 TABLET TWICE DAILY 11/14/12   Judy Pimple, MD  levETIRAcetam (KEPPRA) 750 MG tablet Take 1 tablet (750 mg total) by mouth 2 (two) times daily. 03/06/13   Henderson Cloud, MD  losartan (COZAAR) 100 MG tablet Take 1 tablet (100 mg total) by mouth daily. 10/03/12 10/03/13  Peter M [place], MD  nitroGLYCERIN (NITROSTAT) 0.4 MG SL tablet Place 1 tablet (0.4 mg total) under the tongue every 5 (five) minutes as needed. 04/20/11   Peter M [place], MD  Omega-3 Fatty Acids ([employer] NATURAL FISH  OIL) 1200 MG CAPS Take 1 capsule by mouth 2 (two) times daily. 08/13/12   Judy Pimple, MD  ranolazine (RANEXA) 500 MG 12 hr tablet Take 500 mg by mouth 2 (two) times daily.    Historical Provider, MD  rosuvastatin (CRESTOR) 10 MG tablet Take 10 mg by mouth daily.    Historical Provider, MD  traMADol (ULTRAM) 50 MG tablet Take 50 mg by mouth 2 (two) times daily.      Historical Provider, MD   Physical Exam: Filed Vitals:   03/14/13 0925 03/14/13 0930 03/14/13 1030 03/14/13 1130  BP: 124/77 122/66 119/67 124/79  Pulse: 99 100 100 100  Temp: 97.3 F (36.3 C)     TempSrc:  Rectal     Resp: 16 17 15 18   SpO2: 99% 97% 97% 96%     General:  Sleepy in NAD  Eyes: 3 mm, but constircts to light.  Fundoscopy deferred  ENT:  Cannot open her mouth.  Neck: no jvd, bruit  Cardiovascular:  s1 s2 tachcyardic  Respiratory:  Clear, no added sound  Abdomen: Soft nontender nondistended  Skin: No lower extremity edema  Musculoskeletal: Range of motion benign but patient cannot cooperate with exam  Psychiatric: Cannot determine  Neurologic: Hyperreflexic, 45 beats of clonus noted in ankles, cannot assist with motor exam.  No doll's eye phenomenon during seizure activity time, patient second episode was able to blink with response to menance   Labs on Admission:  Basic Metabolic Panel:  Recent Labs Lab 03/14/13 1000  NA 140  K 2.9*  CL 104  CO2 23  GLUCOSE 161*  BUN 28*  CREATININE 0.65  CALCIUM 8.8  MG 1.5  PHOS 3.5   Liver Function Tests:  Recent Labs Lab 03/14/13 1000  AST 13  ALT 14  ALKPHOS 73  BILITOT 0.3  PROT 6.0  ALBUMIN 2.8*   No results found for this basename: LIPASE, AMYLASE,  in the last 168 hours No results found for this basename: AMMONIA,  in the last 168 hours CBC:  Recent Labs Lab 03/14/13 1000  WBC 8.0  HGB 11.1*  HCT 35.7*  MCV 71.3*  PLT 228   Cardiac Enzymes: No results found for this basename: CKTOTAL, CKMB, CKMBINDEX, TROPONINI,  in  the last 168 hours  BNP (last 3 results) No results found for this basename: PROBNP,  in the last 8760 hours CBG:  Recent Labs Lab 03/14/13 1001  GLUCAP 156*    Radiological Exams on Admission: Ct Head Wo Contrast  03/14/2013   *RADIOLOGY REPORT*  Clinical Data: Altered mental status, seizure  CT HEAD WITHOUT CONTRAST  Technique:  Contiguous axial images were obtained from the base of the skull through the vertex without contrast.  Comparison: 03/02/2013  Findings: No skull fracture is noted.  Paranasal sinuses and mastoid air cells are unremarkable.  No intracranial hemorrhage, mass effect or midline shift.  Stable encephalomalacia in the right parietal region.  No acute cortical infarction.  No mass lesion is noted on this unenhanced scan.  IMPRESSION: No acute intracranial abnormality.  Stable encephalomalacia in the right parietal region.   Original Report Authenticated By: Natasha Mead, M.D.    EKG: Independently reviewed. Non performed  Assessment/Plan Active Problems:   * No active hospital problems. *   1. Recurrent seizures-neurology consulted in ED and recommended low-dose Keppra. I spoke personally with Dr. Roseanne Reno to get his clinical impression and at this point we will treat as if this is partial seizure secondary generalized and not her with fosphenytoin 1000 mg and then Dilantin 100 3 times a day. Potentially we'll need to prolonged EEG to capture spells. If clinical decompensation occurs or if patient unable to protect airway, will potentially need to intubate her. I have discussed this with family who states to do this unless  brain dead and medical futility. No organic pathology occurs, patient will need further psychiatric input.  Will get an ammonia to insure there is no other cause for her seizures  2. Acute kidney injury-discontinue HCTZ 50 daily losartan 100 daily-continue IV fluids 50 cc per hour 3. History CAD-hold her by mouth medications will be held. She'll be  placed on every 6 hourly metoprolol IV 4. Chronic  pain-she is on Ranexa 500 q12, tramadol 50 twice a day and Flexeril 10 mg Q. day-these can interact especially the tramadol and can cause seizures and these will be discontinued indefinitely 5. History of stroke remotely-CT scan of brain ordered and emergency room negative for acute event-no further utility in ordering imaging at present. For decision of this to neurology. Continue aspirin 325 daily rectally 6. Hyperlipidemia hold Crestor 10 mg daily 7. Hypokalemia we'll replace this with IV potassium and repeat labs in the morning. 8. Obstructive sleep apnea-hold CPAP for now 9. Fibromyalgia-currently stable-see above regarding discontinuation of medications 10. Diabetes mellitus-every 4 hourly checks as n.p.o.   Neurology  Condition gaurded  Code Status: Full  Family Communication: Stress with mother patient's nephew at bedside-unexplained seriousness of situation. Patient will be admitted to step down unit. She is a full code unless further decisions arise or condition changes and then discussion with family to be undertaken again Disposition Plan: Inpatient, stuck   Time spent: 60  [facility] Pager [phone]  If 7PM-7AM, please contact night-coverage [email or web address] Password [facility] 03/14/2013, 1:09 PM

## 2013-03-14 NOTE — Consult Note (Signed)
NEURO HOSPITALIST CONSULT NOTE    Reason for Consult: Recurrent seizure activity.  HPI:                                                                                                                                          Diane [name] is an 57 y.o. female with recent onset of partial seizure disorder, hypertension, hyperlipidemia, diabetes mellitus, COPD, coronary disease and stroke at age 49, presenting with recurrent multiple partial seizures. Patient was noted to have spells of staring and inattentiveness repeatedly this morning, including after arriving in the emergency room. She was hospitalized 2 weeks ago for new-onset partial seizure disorder. MRI was unremarkable for acute lesion. EEG was abnormal consistent with focal seizure disorder. She was discharged on Keppra 750 mg twice a day. Repeat CT scan of her head today showed no acute intracranial abnormality. Encephalomalacia involving the right parietal region was unchanged from previous recent imaging studies. She was given 1 mg of Ativan prior to arrival, as well as 5 mg of Versed. Loading dose of 1000 mg of Keppra was given in the emergency room. Patient was noted to be obtunded, presumably due to sedating medications and postictal state.   Past Medical History  Diagnosis Date  . Coronary artery disease     post CABG in 3/07   . Hypertension   . Hyperlipidemia   . Dyslipidemia   . Diabetes mellitus     type 2  . Chronic bronchitis   . CVA (cerebral infarction) 1993  . Tobacco abuse   . COPD (chronic obstructive pulmonary disease)   . Stroke 1993    Past Surgical History  Procedure Laterality Date  . Coronary stent placement  08/11/06    PCI of her ciurcumflex/OM vessel  . Cardiac catheterization  11/29/05    EF of 55%  . Cardiac catheterization  08/06/06    EF of 45-50%  . Coronary artery bypass graft  12/04/2005    x5 -- left internal mammary artery to the LAD, left radial artery to the ramus  intermedius, saphenous vein graft to the obtuse marginal 1, sequential saphenous vein grat to the acute marginal and posterior descending, endoscopic vein harvesting from the left thigh with open vein harvest from right leg    Family History  Problem Relation Age of Onset  . Coronary artery disease    . Heart disease    . Heart disease Mother   . Diabetes Mother   . COPD Mother   . Hyperlipidemia Mother   . Hypertension Mother   . Cancer Father   . Drug abuse Paternal Grandmother   . Stroke Paternal Grandfather     Social History:  reports that she has been smoking Cigarettes.  She has a 72 pack-year smoking history.  She has never used smokeless tobacco. She reports that she drinks about 0.5 ounces of alcohol per week. She reports that she does not use illicit drugs.  Allergies  Allergen Reactions  . Ace Inhibitors     REACTION: cough  . Tetracycline     REACTION: reaction not known    MEDICATIONS:                                                                                                                     I have reviewed the patient's current medications. Prior to Admission:   albuterol (PROVENTIL) (2.5 MG/3ML) 0.083% nebulizer solution   Nebulization   Take 3 mLs (2.5 mg total) by nebulization every 6 (six) hours as needed.   360 mL   3     491.9   .  aspirin 325 MG tablet   Oral   Take 1 tablet (325 mg total) by mouth daily.   90 tablet   0   .  carvedilol (COREG) 12.5 MG tablet   Oral   Take 12.5 mg by mouth 2 (two) times daily with a meal.       .  cetirizine (ZYRTEC) 10 MG tablet   Oral   Take 1 tablet (10 mg total) by mouth daily.   90 tablet   0   .  clopidogrel (PLAVIX) 75 MG tablet   Oral   Take 75 mg by mouth daily.       .  cyclobenzaprine (FLEXERIL) 10 MG tablet   Oral   Take 10 mg by mouth as needed.       [name]  dexlansoprazole (DEXILANT) 60 MG capsule   Oral   Take 1 capsule (60 mg total) by mouth daily.   30 capsule   11   .  docusate sodium (COLACE) 100 MG  capsule   Oral   Take 1 capsule (100 mg total) by mouth 2 (two) times daily.   180 capsule   0   .  guaiFENesin (MUCINEX) 600 MG 12 hr tablet   Oral   Take 2 tablets (1,200 mg total) by mouth 2 (two) times daily.   360 tablet   0   .  hydrochlorothiazide (HYDRODIURIL) 50 MG tablet   Oral   Take 50 mg by mouth daily.       .  insulin glargine (LANTUS) 100 UNIT/ML injection   Subcutaneous   Inject 0.15 mLs (15 Units total) into the skin at bedtime.   10 mL   12   .  isosorbide mononitrate (IMDUR) 60 MG 24 hr tablet     TAKE 1 TABLET (60MG  TOTAL) BY MOUTH TWICE A DAY   180 tablet   0     Need to call and schedule follow up office visit t ...   .  KLOR-CON M10 10 MEQ tablet     TAKE 1 TABLET TWICE DAILY   180 tablet   1   .  levETIRAcetam (KEPPRA) 750 MG tablet  Oral   Take 1 tablet (750 mg total) by mouth 2 (two) times daily.       [name]  losartan (COZAAR) 100 MG tablet   Oral   Take 1 tablet (100 mg total) by mouth daily.   90 tablet   3   .  nitroGLYCERIN (NITROSTAT) 0.4 MG SL tablet   Sublingual   Place 1 tablet (0.4 mg total) under the tongue every 5 (five) minutes as needed.   25 tablet   11   .  Omega-3 Fatty Acids ([employer] NATURAL FISH OIL) 1200 MG CAPS   Oral   Take 1 capsule by mouth 2 (two) times daily.   180 capsule   0   .  ranolazine (RANEXA) 500 MG 12 hr tablet   Oral   Take 500 mg by mouth 2 (two) times daily.       .  rosuvastatin (CRESTOR) 10 MG tablet   Oral   Take 10 mg by mouth daily.       .  traMADol (ULTRAM) 50 MG tablet   Oral   Take 50 mg by mouth 2 (two) times daily.            ROS:                                                                                                                                       Unavailable.  Blood pressure 124/79, pulse 100, temperature 97.3 F (36.3 C), temperature source Rectal, resp. rate 18, last menstrual period 09/02/2003, SpO2 96.00%.   Neurologic Examination:                                                                                                       Patient was obtunded with minimal response including to noxious stimuli. Respirations were regular. Patient is in no acute distress. Pupils were equal and reacted normally to light. Extraocular movements were intact to oculocephalic maneuvers. No signs of facial weakness were noted. Moved extremities spontaneously with apparent equal strength. Muscle tone was flaccid throughout. Deep tendon reflexes were 2+ and symmetrical. Plantars pulses were mute bilaterally.  Lab Results  Component Value Date/Time   CHOL 111 12/12/2012  1:00 PM    Results for orders placed during the hospital encounter of 03/14/13 (from the past 48 hour(s))  COMPREHENSIVE METABOLIC PANEL     Status: Abnormal   Collection Time    03/14/13 10:00 AM      Result Value Range  Sodium 140  135 - 145 mEq/L   Potassium 2.9 (*) 3.5 - 5.1 mEq/L   Chloride 104  96 - 112 mEq/L   CO2 23  19 - 32 mEq/L   Glucose, Bld 161 (*) 70 - 99 mg/dL   BUN 28 (*) 6 - 23 mg/dL   Creatinine, Ser 1.61  0.50 - 1.10 mg/dL   Calcium 8.8  8.4 - 09.6 mg/dL   Total Protein 6.0  6.0 - 8.3 g/dL   Albumin 2.8 (*) 3.5 - 5.2 g/dL   AST 13  0 - 37 U/L   ALT 14  0 - 35 U/L   Alkaline Phosphatase 73  39 - 117 U/L   Total Bilirubin 0.3  0.3 - 1.2 mg/dL   GFR calc non Af Amer >90  >90 mL/min   GFR calc Af Amer >90  >90 mL/min   Comment:            The eGFR has been calculated     using the CKD EPI equation.     This calculation has not been     validated in all clinical     situations.     eGFR's persistently     <90 mL/min signify     possible Chronic Kidney Disease.  MAGNESIUM     Status: None   Collection Time    03/14/13 10:00 AM      Result Value Range   Magnesium 1.5  1.5 - 2.5 mg/dL  PHOSPHORUS     Status: None   Collection Time    03/14/13 10:00 AM      Result Value Range   Phosphorus 3.5  2.3 - 4.6 mg/dL  CBC     Status: Abnormal   Collection Time    03/14/13 10:00 AM      Result Value Range   WBC  8.0  4.0 - 10.5 K/uL   RBC 5.01  3.87 - 5.11 MIL/uL   Hemoglobin 11.1 (*) 12.0 - 15.0 g/dL   HCT 04.5 (*) 40.9 - 81.1 %   MCV 71.3 (*) 78.0 - 100.0 fL   MCH 22.2 (*) 26.0 - 34.0 pg   MCHC 31.1  30.0 - 36.0 g/dL   RDW 91.4 (*) 78.2 - 95.6 %   Platelets 228  150 - 400 K/uL  ETHANOL     Status: None   Collection Time    03/14/13 10:00 AM      Result Value Range   Alcohol, Ethyl (B) <11  0 - 11 mg/dL   Comment:            LOWEST DETECTABLE LIMIT FOR     SERUM ALCOHOL IS 11 mg/dL     FOR MEDICAL PURPOSES ONLY  POCT I-STAT 3, BLOOD GAS (G3+)     Status: Abnormal   Collection Time    03/14/13 10:00 AM      Result Value Range   pH, Arterial 7.374  7.350 - 7.450   pCO2 arterial 38.5  35.0 - 45.0 mmHg   pO2, Arterial 118.0 (*) 80.0 - 100.0 mmHg   Bicarbonate 22.7  20.0 - 24.0 mEq/L   TCO2 24  0 - 100 mmol/L   O2 Saturation 99.0     Acid-base deficit 3.0 (*) 0.0 - 2.0 mmol/L   Patient temperature 97.3 F     Collection site RADIAL, ALLEN'S TEST ACCEPTABLE     Sample type ARTERIAL    GLUCOSE, CAPILLARY     Status: Abnormal  Collection Time    03/14/13 10:01 AM      Result Value Range   Glucose-Capillary 156 (*) 70 - 99 mg/dL    Ct Head Wo Contrast  03/14/2013   *RADIOLOGY REPORT*  Clinical Data: Altered mental status, seizure  CT HEAD WITHOUT CONTRAST  Technique:  Contiguous axial images were obtained from the base of the skull through the vertex without contrast.  Comparison: 03/02/2013  Findings: No skull fracture is noted.  Paranasal sinuses and mastoid air cells are unremarkable.  No intracranial hemorrhage, mass effect or midline shift.  Stable encephalomalacia in the right parietal region.  No acute cortical infarction.  No mass lesion is noted on this unenhanced scan.  IMPRESSION: No acute intracranial abnormality.  Stable encephalomalacia in the right parietal region.   Original Report Authenticated By: Natasha Mead, M.D.     Assessment/Plan: Recently diagnosed partial seizure  disorder patient presenting with multiple recurrent partial seizures. There's no evidence of acute intracranial abnormality. Neurologic examination is nonfocal at this point.  I am recommending increasing Keppra to 1000 mg every 12 hours. Dose may need to be increased if recurrent seizure activity is observed.Maryclare Labrador consider long-term EEG monitoring as patient's mental status has not returned to baseline in total 24 hours.   [name] M.D. [facility] [phone]   03/14/2013, 12:21 PM

## 2013-03-14 NOTE — ED Notes (Signed)
CBG 156 

## 2013-03-14 NOTE — ED Provider Notes (Signed)
History     CSN: [id number]  Arrival date & time 03/14/13  0902   First MD Initiated Contact with Patient 03/14/13 [phone]      Chief Complaint  Patient presents with  . Seizures    (Consider location/radiation/quality/duration/timing/severity/associated sxs/prior treatment) HPI Patient presents from her nursing facility with concerns of multiple seizures. On my exam the patient is obtunded, unable to provide any details of the history of present illness. This is a level V caveat. Per EMS the patient's facility reports that over the past day she has had increasing amounts and frequency of up sounds like periods with focal shaking. Per report the patient is taking all medication as recently prescribed. Per report the patient was discharged in stable condition recently following an episode of seizure. Per EMS the patient received multiple doses of benzodiazepine and rate with small decrease in seizure-like activity. On my exam she is not shaking, but she is responsive only to pain.  Past Medical History  Diagnosis Date  . Coronary artery disease     post CABG in 3/07   . Hypertension   . Hyperlipidemia   . Dyslipidemia   . Diabetes mellitus     type 2  . Chronic bronchitis   . CVA (cerebral infarction) 1993  . Tobacco abuse   . COPD (chronic obstructive pulmonary disease)   . Stroke 1993    Past Surgical History  Procedure Laterality Date  . Coronary stent placement  08/11/06    PCI of her ciurcumflex/OM vessel  . Cardiac catheterization  11/29/05    EF of 55%  . Cardiac catheterization  08/06/06    EF of 45-50%  . Coronary artery bypass graft  12/04/2005    x5 -- left internal mammary artery to the LAD, left radial artery to the ramus intermedius, saphenous vein graft to the obtuse marginal 1, sequential saphenous vein grat to the acute marginal and posterior descending, endoscopic vein harvesting from the left thigh with open vein harvest from right leg    Family History   Problem Relation Age of Onset  . Coronary artery disease    . Heart disease    . Heart disease Mother   . Diabetes Mother   . COPD Mother   . Hyperlipidemia Mother   . Hypertension Mother   . Cancer Father   . Drug abuse Paternal Grandmother   . Stroke Paternal Grandfather     History  Substance Use Topics  . Smoking status: Current Some Day Smoker -- 2.00 packs/day for 36 years    Types: Cigarettes  . Smokeless tobacco: Never Used     Comment: 1.5-2 ppd  . Alcohol Use: 0.5 oz/week    1 drink(s) per week     Comment: occ    OB History   Grav Para Term Preterm Abortions TAB SAB Ect Mult Living   1               Review of Systems  Unable to perform ROS: Mental status change    Allergies  Ace inhibitors and Tetracycline  Home Medications   Current Outpatient Rx  Name  Route  Sig  Dispense  Refill  . albuterol (PROVENTIL) (2.5 MG/3ML) 0.083% nebulizer solution   Nebulization   Take 3 mLs (2.5 mg total) by nebulization every 6 (six) hours as needed.   360 mL   3     491.9   . aspirin 325 MG tablet   Oral   Take 1 tablet (  325 mg total) by mouth daily.   90 tablet   0   . carvedilol (COREG) 12.5 MG tablet   Oral   Take 12.5 mg by mouth 2 (two) times daily with a meal.         . cetirizine (ZYRTEC) 10 MG tablet   Oral   Take 1 tablet (10 mg total) by mouth daily.   90 tablet   0   . clopidogrel (PLAVIX) 75 MG tablet   Oral   Take 75 mg by mouth daily.         . cyclobenzaprine (FLEXERIL) 10 MG tablet   Oral   Take 10 mg by mouth as needed.           [name] dexlansoprazole (DEXILANT) 60 MG capsule   Oral   Take 1 capsule (60 mg total) by mouth daily.   30 capsule   11   . docusate sodium (COLACE) 100 MG capsule   Oral   Take 1 capsule (100 mg total) by mouth 2 (two) times daily.   180 capsule   0   . guaiFENesin (MUCINEX) 600 MG 12 hr tablet   Oral   Take 2 tablets (1,200 mg total) by mouth 2 (two) times daily.   360 tablet   0   .  hydrochlorothiazide (HYDRODIURIL) 50 MG tablet   Oral   Take 50 mg by mouth daily.         . insulin glargine (LANTUS) 100 UNIT/ML injection   Subcutaneous   Inject 0.15 mLs (15 Units total) into the skin at bedtime.   10 mL   12   . isosorbide mononitrate (IMDUR) 60 MG 24 hr tablet      TAKE 1 TABLET (60MG  TOTAL) BY MOUTH TWICE A DAY   180 tablet   0     Need to call and schedule follow up office visit t ...   . KLOR-CON M10 10 MEQ tablet      TAKE 1 TABLET TWICE DAILY   180 tablet   1   . levETIRAcetam (KEPPRA) 750 MG tablet   Oral   Take 1 tablet (750 mg total) by mouth 2 (two) times daily.         [name] losartan (COZAAR) 100 MG tablet   Oral   Take 1 tablet (100 mg total) by mouth daily.   90 tablet   3   . nitroGLYCERIN (NITROSTAT) 0.4 MG SL tablet   Sublingual   Place 1 tablet (0.4 mg total) under the tongue every 5 (five) minutes as needed.   25 tablet   11   . Omega-3 Fatty Acids ([employer] NATURAL FISH OIL) 1200 MG CAPS   Oral   Take 1 capsule by mouth 2 (two) times daily.   180 capsule   0   . ranolazine (RANEXA) 500 MG 12 hr tablet   Oral   Take 500 mg by mouth 2 (two) times daily.         . rosuvastatin (CRESTOR) 10 MG tablet   Oral   Take 10 mg by mouth daily.         . traMADol (ULTRAM) 50 MG tablet   Oral   Take 50 mg by mouth 2 (two) times daily.             BP 124/77  Pulse 99  Temp(Src) 97.3 F (36.3 C) (Rectal)  Resp 16  SpO2 99%  LMP 09/02/2003  Physical Exam  Nursing note and  vitals reviewed. Constitutional: She appears well-developed and well-nourished. She appears distressed.  Minimal shaking, obtunded  HENT:  Head: Normocephalic and atraumatic.  Eyes: Conjunctivae are normal.  Disconjugate gaze  Neck: No tracheal deviation present.  Cardiovascular: Regular rhythm.  Tachycardia present.   Pulmonary/Chest: Effort normal and breath sounds normal. No stridor. No respiratory distress.  Abdominal: Normal appearance and  bowel sounds are normal. She exhibits no distension.  Musculoskeletal: She exhibits no edema.  Neurological: She is unresponsive. No cranial nerve deficit.  Patient is obtunded, though she moves all extremities spontaneously, she does not track visually, and is verbally nonactive  Skin: Skin is warm and dry.  Psychiatric: She has a normal mood and affect.    ED Course  Procedures (including critical care time)  Labs Reviewed  COMPREHENSIVE METABOLIC PANEL  MAGNESIUM  PHOSPHORUS  CBC  ETHANOL  PREGNANCY, URINE  BLOOD GAS, ARTERIAL   No results found.   No diagnosis found.  Pulse ox 99% supplemental oxygen abnormal Cardiac 100 sinus rhythm normal   During initial evaluation discussed the patient's care with EMS. I then reviewed the patient's chart and electronic medical record, including her recent prescription for Keppra to With ongoing seizure activity, she received another intramuscular dose of benzodiazepine, and will receive heparin IV.  Airway is initially patent   10:31 AM Patient sleeping. HR 101 - st, abnormal She awakens to sternal rub, though she is non-verbal / shaking.  11:13 AM Patient's snoring. I discussed the patient's case with my neurology colleagues.  They will assist with her care.  12:05 PM Patient remains in similar condition, airway is intact. Neurology has seen the patient.  Update: Patient remained hemodynamic stable, though with sleeping. MDM  This patient with history of seizure disorder, recent meningioma resection now presents in status epilepticus.  On initial presentation the patient has a patent airway, but is obtunded.  Following initial evaluation the patient received benzodiazepine, and with no improvement in her condition, IV Keppra was loaded. Following provision of this medication, seizure activity stopped, though the patient remained obtunded. With the history of recent intracranial surgery, CT was performed, and was  unremarkable. The patient had labs were generally reassuring, but with her persistent seizure activity, her obtunded status, as the patient's case with neurology.  Our neurologist agreed with the Keppra loading, agitated admission to the step down bed for continuous neurologic monitoring given the presentation for status epilepticus.  CRITICAL CARE Performed by: Gerhard Munch Total critical care time: 45 Critical care time was exclusive of separately billable procedures and treating other patients. Critical care was necessary to treat or prevent imminent or life-threatening deterioration. Critical care was time spent personally by me on the following activities: development of treatment plan with patient and/or surrogate as well as nursing, discussions with consultants, evaluation of patient's response to treatment, examination of patient, obtaining history from patient or surrogate, ordering and performing treatments and interventions, ordering and review of laboratory studies, ordering and review of radiographic studies, pulse oximetry and re-evaluation of patient's condition.         Gerhard Munch, MD 03/14/13 [phone]

## 2013-03-14 NOTE — Progress Notes (Addendum)
Noted seizure activity more to the rt arm and patient with starring gaze lasted approximately 50sec Another seizure activity at 1450hrs lasted approximately 45 sec.Md made aware.Rapid response and MD paged. Ativan 4mg  given,Dialntin and Cerbyx load given.patient on seizure precautions.Will continue to monitor closely

## 2013-03-14 NOTE — ED Notes (Signed)
Mike-pts nephew.  [phone].  Please call with status change, or when admitting pt.

## 2013-03-15 DIAGNOSIS — R569 Unspecified convulsions: Secondary | ICD-10-CM

## 2013-03-15 LAB — COMPREHENSIVE METABOLIC PANEL
ALT: 14 U/L (ref 0–35)
AST: 18 U/L (ref 0–37)
Albumin: 2.8 g/dL — ABNORMAL LOW (ref 3.5–5.2)
Alkaline Phosphatase: 70 U/L (ref 39–117)
BUN: 18 mg/dL (ref 6–23)
CO2: 21 mEq/L (ref 19–32)
Calcium: 9 mg/dL (ref 8.4–10.5)
Chloride: 110 mEq/L (ref 96–112)
Creatinine, Ser: 0.44 mg/dL — ABNORMAL LOW (ref 0.50–1.10)
GFR calc Af Amer: >90 mL/min (ref >90)
GFR calc non Af Amer: >90 mL/min (ref >90)
Glucose, Bld: 94 mg/dL (ref 70–99)
Potassium: 3.3 mEq/L — ABNORMAL LOW (ref 3.5–5.1)
Sodium: 142 mEq/L (ref 135–145)
Total Bilirubin: 0.2 mg/dL — ABNORMAL LOW (ref 0.3–1.2)
Total Protein: 5.7 g/dL — ABNORMAL LOW (ref 6.0–8.3)

## 2013-03-15 LAB — CBC
HCT: 35.8 % — ABNORMAL LOW (ref 36.0–46.0)
Hemoglobin: 10.9 g/dL — ABNORMAL LOW (ref 12.0–15.0)
MCH: 22.2 pg — ABNORMAL LOW (ref 26.0–34.0)
MCHC: 30.4 g/dL (ref 30.0–36.0)
MCV: 72.8 fL — ABNORMAL LOW (ref 78.0–100.0)
Platelets: 203 10*3/uL (ref 150–400)
RBC: 4.92 MIL/uL (ref 3.87–5.11)
RDW: 18.7 % — ABNORMAL HIGH (ref 11.5–15.5)
WBC: 6.4 10*3/uL (ref 4.0–10.5)

## 2013-03-15 LAB — GLUCOSE, CAPILLARY
Glucose-Capillary: 137 mg/dL — ABNORMAL HIGH (ref 70–99)
Glucose-Capillary: 134 mg/dL — ABNORMAL HIGH (ref 70–99)

## 2013-03-15 LAB — PHENYTOIN LEVEL, TOTAL: Phenytoin Lvl: 10.8 ug/mL (ref 10.0–20.0)

## 2013-03-15 MED ORDER — MAGNESIUM SULFATE 40 MG/ML IJ SOLN
2.0000 g | Freq: Once | INTRAMUSCULAR | Status: AC
  Administered 2013-03-15: 2 g via INTRAVENOUS
  Filled 2013-03-15: qty 50

## 2013-03-15 MED ORDER — INSULIN GLARGINE 100 UNIT/ML ~~LOC~~ SOLN
10.0000 [IU] | Freq: Every day | SUBCUTANEOUS | Status: DC
  Administered 2013-03-15: 10 [IU] via SUBCUTANEOUS
  Filled 2013-03-15 (×2): qty 0.1

## 2013-03-15 MED ORDER — SODIUM CHLORIDE 0.9 % IV SOLN: 1000.0000 mg | Freq: Three times a day (TID) | INTRAVENOUS | Status: DC

## 2013-03-15 MED ORDER — SODIUM CHLORIDE 0.9 % IV SOLN
1000.0000 mg | Freq: Two times a day (BID) | INTRAVENOUS | Status: DC
  Administered 2013-03-15: 1000 mg via INTRAVENOUS
  Filled 2013-03-15 (×2): qty 10

## 2013-03-15 MED ORDER — SODIUM CHLORIDE 0.9 % IV SOLN
750.0000 mg | Freq: Once | INTRAVENOUS | Status: AC
  Administered 2013-03-15: 750 mg via INTRAVENOUS
  Filled 2013-03-15: qty 7.5

## 2013-03-15 MED ORDER — LEVETIRACETAM 500 MG/5ML IV SOLN
1000.0000 mg | Freq: Three times a day (TID) | INTRAVENOUS | Status: DC
  Administered 2013-03-15 – 2013-03-17 (×5): 1000 mg via INTRAVENOUS
  Filled 2013-03-15 (×9): qty 10

## 2013-03-15 MED ORDER — KCL IN DEXTROSE-NACL 20-5-0.9 MEQ/L-%-% IV SOLN
INTRAVENOUS | Status: DC
  Administered 2013-03-15: 1000 mL via INTRAVENOUS
  Administered 2013-03-16: 13:00:00 via INTRAVENOUS
  Filled 2013-03-15 (×3): qty 1000

## 2013-03-15 MED ORDER — INSULIN ASPART 100 UNIT/ML ~~LOC~~ SOLN
0.0000 [IU] | SUBCUTANEOUS | Status: DC
  Administered 2013-03-15 – 2013-03-16 (×3): 2 [IU] via SUBCUTANEOUS
  Administered 2013-03-16: 3 [IU] via SUBCUTANEOUS
  Administered 2013-03-16: 11 [IU] via SUBCUTANEOUS

## 2013-03-15 MED ORDER — POTASSIUM CHLORIDE 10 MEQ/100ML IV SOLN
10.0000 meq | INTRAVENOUS | Status: AC
  Administered 2013-03-15 (×4): 10 meq via INTRAVENOUS
  Filled 2013-03-15: qty 400

## 2013-03-15 NOTE — Progress Notes (Signed)
Upon assessment of pt with Neuro MD in room, pt noted to have an approximately 40 sec seizure involving R extremity movement and a gaze.  Will continue to monitor and assess.  Salomon Mast, RN

## 2013-03-15 NOTE — Progress Notes (Signed)
Diane Junior, MD aware of pt having multiple seizures last night and this morning.  All seizures last about 30-40 secs and involve movement of the right side with a gaze.  Sats remain >90%, pt maintains airway and breathes well through seizure, and HR stays WNL, SR.  MD says to keep monitoring pt and continue current treatments.  Will continue to monitor.  Salomon Mast, RN

## 2013-03-15 NOTE — Progress Notes (Signed)
[facility] Progress Note [place] TEAM 1 - Stepdown/ICU TEAM   Kischa Altice Thakur ZOX:[id number] DOB: 09-01-1956 DOA: 03/14/2013 PCP: [name], MD  Brief narrative: 57 y.o. female who was recently d/c to [facility]. Family was told that patient started to have seizure epiodes about 7-8 min apart. She had been getting up but falling more at [facility] per family.   Work-up last hospital stay (6/3 - 6/6) included MRI (no acute findings per Neuro) and EEG showing only one episode of possible seizure activity despite continuous EEG monitoring in setting of multiple "seizure" episodes.  Because of suspected pseudoseizures Psych was also consulted during that admission, but did not recommend inpt Psych hospitalization.  She was d/c on Keppra 750 bid with plan to follow closely with Neurology.   In the ED she was loaded with Keppra 1000 mg and was given 1mg  of ativan and 5 mg versed as well.  She had 2 episodes lasitng about 30 seconds of gen shaking of upper extremities, with a fixed stare and shaking during the admitting doctor's exam.   Assessment/Plan:  Focal seizure disorder with recurrent focal seizure activity  Ongoing care and med adjustments per Neurology  Mild Hypokalemia Replete IV and follow trend - check Mg in AM - likely due to poor intake  Hx CAD s/p CABG x5 2007 Low risk stress nuclear study May 2014 ()  OSA QHS BIPAP per home regimen  Fibromyalgia / Chronic pain   DM 2 CBG currently well controlled   Tobacco abuse - 2PPD ongoing w/ chronic bronchitis   HTN Reasonably controlled at the present time - continue to follow with no change in medical regimen  HLD Unable to take oral medications at this time  Remote hx of CVA R parietal region  Microcytic anemia Will check anemia panel in a.m.  Code Status: RULL Family Communication: Spoke with family at bedside Disposition Plan:  SDU  Consultants: Neurology  Procedures: None  Antibiotics: None  DVT prophylaxis: Subcutaneous heparin  HPI/Subjective: The patient is obtunded to time my visit.  She is not able to provide any history.  She appear stable otherwise and in no respiratory or hemodynamic distress.  Objective: Blood pressure 152/73, pulse 83, temperature 98.6 F (37 C), temperature source Oral, resp. rate 15, height 5\' 2"  (1.575 m), weight 67.3 kg (148 lb 5.9 oz), last menstrual period 09/02/2003, SpO2 97.00%.  Intake/Output Summary (Last 24 hours) at 03/15/13 1121 Last data filed at 03/15/13 0841  Gross per 24 hour  Intake 893.33 ml  Output      0 ml  Net 893.33 ml    Exam: General: No acute respiratory distress - obtunded Lungs: Clear to auscultation bilaterally without wheezes or crackles Cardiovascular: Regular rate and rhythm without murmur gallop or rub normal S1 and S2 Abdomen: Nontender, nondistended, soft, bowel sounds positive, no rebound, no ascites, no appreciable mass Extremities: No significant cyanosis, clubbing, or edema bilateral lower extremities  Data Reviewed: Basic Metabolic Panel:  Recent Labs Lab 03/14/13 1000 03/14/13 1538 03/15/13 0540  NA 140  --  142  K 2.9*  --  3.3*  CL 104  --  110  CO2 23  --  21  GLUCOSE 161*  --  94  BUN 28*  --  18  CREATININE 0.65 0.50 0.44*  CALCIUM 8.8  --  9.0  MG 1.5  --   --   PHOS 3.5  --   --    Liver Function Tests:  Recent Labs Lab 03/14/13 1000 03/15/13 0540  AST 13 18  ALT 14 14  ALKPHOS 73 70  BILITOT 0.3 0.2*  PROT 6.0 5.7*  ALBUMIN 2.8* 2.8*    Recent Labs Lab 03/14/13 1538  AMMONIA 36   CBC:  Recent Labs Lab 03/14/13 1000 03/14/13 1538 03/15/13 0540  WBC 8.0 11.0* 6.4  HGB 11.1* 10.9* 10.9*  HCT 35.7* 35.6* 35.8*  MCV 71.3* 71.9* 72.8*  PLT 228 210 203   CBG:  Recent Labs Lab 03/14/13 1001 03/14/13 2151  GLUCAP 156* 124*    Recent Results (from the past 240 hour(s))  MRSA  PCR SCREENING     Status: None   Collection Time    03/14/13  4:07 PM      Result Value Range Status   MRSA by PCR NEGATIVE  NEGATIVE Final   Comment:            The GeneXpert MRSA Assay (FDA     approved for NASAL specimens     only), is one component of a     comprehensive MRSA colonization     surveillance program. It is not     intended to diagnose MRSA     infection nor to guide or     monitor treatment for     MRSA infections.     Studies:  Recent x-ray studies have been reviewed in detail by the Attending Physician  Scheduled Meds:  Scheduled Meds: . antiseptic oral rinse  15 mL Mouth Rinse BID  . aspirin  300 mg Rectal Daily  . heparin  5,000 Units Subcutaneous Q8H  . levETIRAcetam  1,000 mg Intravenous Q12H  . metoprolol  2.5 mg Intravenous Q6H  . phenytoin (DILANTIN) IV  100 mg Intravenous Q8H  . sodium chloride  3 mL Intravenous Q12H    Time spent on care of this patient:   [facility] T  [facility] Office  [phone] Pager - Text Page per Loretha Stapler as per below:  On-Call/Text Page:      [email or web address]      password [email or web address]  If 7PM-7AM, please contact night-coverage [email or web address] Password [email or web address] 03/15/2013, 11:21 AM   LOS: 1 day

## 2013-03-15 NOTE — Progress Notes (Signed)
Subjective: Patient has continued to have focal seizures , including 2 seizures reported last night and recurrent seizure this morning that occurred while I was present. She has not received Keppra since loading dose of 1000 mg yesterday and also did not receive Dilantin Advil 600 this morning.  Objective: Current vital signs: BP 152/73  Pulse 83  Temp(Src) 99.3 F (37.4 C) (Oral)  Resp 16  Ht 5\' 2"  (1.575 m)  Wt 67.3 kg (148 lb 5.9 oz)  BMI 27.13 kg/m2  SpO2 97%  LMP 09/02/2003  Neurologic Exam: Patient experienced a focal seizure while I was present, which consisted of head turning to the right as well as eye deviation to the right, stiffening of right extremities with flexion of right upper extremity, and convulsive movements of right extremities. Spell lasted about 40-50 seconds and resolved without intervention. Within 30 seconds patient was again awake and conversant. She was slightly confused with respect to time.  Lab Results: Results for orders placed during the hospital encounter of 03/14/13 (from the past 48 hour(s))  COMPREHENSIVE METABOLIC PANEL     Status: Abnormal   Collection Time    03/14/13 10:00 AM      Result Value Range   Sodium 140  135 - 145 mEq/L   Potassium 2.9 (*) 3.5 - 5.1 mEq/L   Chloride 104  96 - 112 mEq/L   CO2 23  19 - 32 mEq/L   Glucose, Bld 161 (*) 70 - 99 mg/dL   BUN 28 (*) 6 - 23 mg/dL   Creatinine, Ser 1.61  0.50 - 1.10 mg/dL   Calcium 8.8  8.4 - 09.6 mg/dL   Total Protein 6.0  6.0 - 8.3 g/dL   Albumin 2.8 (*) 3.5 - 5.2 g/dL   AST 13  0 - 37 U/L   ALT 14  0 - 35 U/L   Alkaline Phosphatase 73  39 - 117 U/L   Total Bilirubin 0.3  0.3 - 1.2 mg/dL   GFR calc non Af Amer >90  >90 mL/min   GFR calc Af Amer >90  >90 mL/min   Comment:            The eGFR has been calculated     using the CKD EPI equation.     This calculation has not been     validated in all clinical     situations.     eGFR's persistently     <90 mL/min signify   possible Chronic Kidney Disease.  MAGNESIUM     Status: None   Collection Time    03/14/13 10:00 AM      Result Value Range   Magnesium 1.5  1.5 - 2.5 mg/dL  PHOSPHORUS     Status: None   Collection Time    03/14/13 10:00 AM      Result Value Range   Phosphorus 3.5  2.3 - 4.6 mg/dL  CBC     Status: Abnormal   Collection Time    03/14/13 10:00 AM      Result Value Range   WBC 8.0  4.0 - 10.5 K/uL   RBC 5.01  3.87 - 5.11 MIL/uL   Hemoglobin 11.1 (*) 12.0 - 15.0 g/dL   HCT 04.5 (*) 40.9 - 81.1 %   MCV 71.3 (*) 78.0 - 100.0 fL   MCH 22.2 (*) 26.0 - 34.0 pg   MCHC 31.1  30.0 - 36.0 g/dL   RDW 91.4 (*) 78.2 - 95.6 %   Platelets  228  150 - 400 K/uL  ETHANOL     Status: None   Collection Time    03/14/13 10:00 AM      Result Value Range   Alcohol, Ethyl (B) <11  0 - 11 mg/dL   Comment:            LOWEST DETECTABLE LIMIT FOR     SERUM ALCOHOL IS 11 mg/dL     FOR MEDICAL PURPOSES ONLY  POCT I-STAT 3, BLOOD GAS (G3+)     Status: Abnormal   Collection Time    03/14/13 10:00 AM      Result Value Range   pH, Arterial 7.374  7.350 - 7.450   pCO2 arterial 38.5  35.0 - 45.0 mmHg   pO2, Arterial 118.0 (*) 80.0 - 100.0 mmHg   Bicarbonate 22.7  20.0 - 24.0 mEq/L   TCO2 24  0 - 100 mmol/L   O2 Saturation 99.0     Acid-base deficit 3.0 (*) 0.0 - 2.0 mmol/L   Patient temperature 97.3 F     Collection site RADIAL, ALLEN'S TEST ACCEPTABLE     Sample type ARTERIAL    GLUCOSE, CAPILLARY     Status: Abnormal   Collection Time    03/14/13 10:01 AM      Result Value Range   Glucose-Capillary 156 (*) 70 - 99 mg/dL  AMMONIA     Status: None   Collection Time    03/14/13  3:38 PM      Result Value Range   Ammonia 36  11 - 60 umol/L  CBC     Status: Abnormal   Collection Time    03/14/13  3:38 PM      Result Value Range   WBC 11.0 (*) 4.0 - 10.5 K/uL   RBC 4.95  3.87 - 5.11 MIL/uL   Hemoglobin 10.9 (*) 12.0 - 15.0 g/dL   HCT 96.0 (*) 45.4 - 09.8 %   MCV 71.9 (*) 78.0 - 100.0 fL   MCH  22.0 (*) 26.0 - 34.0 pg   MCHC 30.6  30.0 - 36.0 g/dL   RDW 11.9 (*) 14.7 - 82.9 %   Platelets 210  150 - 400 K/uL  CREATININE, SERUM     Status: None   Collection Time    03/14/13  3:38 PM      Result Value Range   Creatinine, Ser 0.50  0.50 - 1.10 mg/dL   GFR calc non Af Amer >90  >90 mL/min   GFR calc Af Amer >90  >90 mL/min   Comment:            The eGFR has been calculated     using the CKD EPI equation.     This calculation has not been     validated in all clinical     situations.     eGFR's persistently     <90 mL/min signify     possible Chronic Kidney Disease.  MRSA PCR SCREENING     Status: None   Collection Time    03/14/13  4:07 PM      Result Value Range   MRSA by PCR NEGATIVE  NEGATIVE   Comment:            The GeneXpert MRSA Assay (FDA     approved for NASAL specimens     only), is one component of a     comprehensive MRSA colonization     surveillance program. It is not  intended to diagnose MRSA     infection nor to guide or     monitor treatment for     MRSA infections.  GLUCOSE, CAPILLARY     Status: Abnormal   Collection Time    03/14/13  9:51 PM      Result Value Range   Glucose-Capillary 124 (*) 70 - 99 mg/dL  COMPREHENSIVE METABOLIC PANEL     Status: Abnormal   Collection Time    03/15/13  5:40 AM      Result Value Range   Sodium 142  135 - 145 mEq/L   Potassium 3.3 (*) 3.5 - 5.1 mEq/L   Chloride 110  96 - 112 mEq/L   CO2 21  19 - 32 mEq/L   Glucose, Bld 94  70 - 99 mg/dL   BUN 18  6 - 23 mg/dL   Creatinine, Ser 1.61 (*) 0.50 - 1.10 mg/dL   Calcium 9.0  8.4 - 09.6 mg/dL   Total Protein 5.7 (*) 6.0 - 8.3 g/dL   Albumin 2.8 (*) 3.5 - 5.2 g/dL   AST 18  0 - 37 U/L   ALT 14  0 - 35 U/L   Alkaline Phosphatase 70  39 - 117 U/L   Total Bilirubin 0.2 (*) 0.3 - 1.2 mg/dL   GFR calc non Af Amer >90  >90 mL/min   GFR calc Af Amer >90  >90 mL/min   Comment:            The eGFR has been calculated     using the CKD EPI equation.     This  calculation has not been     validated in all clinical     situations.     eGFR's persistently     <90 mL/min signify     possible Chronic Kidney Disease.  CBC     Status: Abnormal   Collection Time    03/15/13  5:40 AM      Result Value Range   WBC 6.4  4.0 - 10.5 K/uL   RBC 4.92  3.87 - 5.11 MIL/uL   Hemoglobin 10.9 (*) 12.0 - 15.0 g/dL   HCT 04.5 (*) 40.9 - 81.1 %   MCV 72.8 (*) 78.0 - 100.0 fL   MCH 22.2 (*) 26.0 - 34.0 pg   MCHC 30.4  30.0 - 36.0 g/dL   RDW 91.4 (*) 78.2 - 95.6 %   Platelets 203  150 - 400 K/uL    Studies/Results: Ct Head Wo Contrast  03/14/2013   *RADIOLOGY REPORT*  Clinical Data: Altered mental status, seizure  CT HEAD WITHOUT CONTRAST  Technique:  Contiguous axial images were obtained from the base of the skull through the vertex without contrast.  Comparison: 03/02/2013  Findings: No skull fracture is noted.  Paranasal sinuses and mastoid air cells are unremarkable.  No intracranial hemorrhage, mass effect or midline shift.  Stable encephalomalacia in the right parietal region.  No acute cortical infarction.  No mass lesion is noted on this unenhanced scan.  IMPRESSION: No acute intracranial abnormality.  Stable encephalomalacia in the right parietal region.   Original Report Authenticated By: Natasha Mead, M.D.    Medications:  I have reviewed the patient's current medications. Scheduled: . antiseptic oral rinse  15 mL Mouth Rinse BID  . aspirin  300 mg Rectal Daily  . heparin  5,000 Units Subcutaneous Q8H  . levETIRAcetam  1,000 mg Intravenous Q12H  . metoprolol  2.5 mg Intravenous Q6H  .  phenytoin (DILANTIN) IV  100 mg Intravenous Q8H  . sodium chloride  3 mL Intravenous Q12H   Continuous:  PRN:  Assessment/Plan: Focal seizure disorder with recurrent focal seizure activity as described above.  Stat Dilantin level was ordered. She is to resume Dilantin 100 mg every 8 hours with first dose given after blood is drawn for Dilantin level. Keppra was  ordered 1000 mg every 12 hours with first dose to be given immediately.  No other changes were made in current management. We will continue to monitor closely with you in the step down unit.  C.R. Roseanne Reno, MD [facility]  [phone]  03/15/2013  8:53 AM

## 2013-03-15 NOTE — Progress Notes (Signed)
Cleotis Lema, MD aware of pt continuing to have focal seizures on the right side.  New orders received to give 750mg  IVPB keppra once now, as well as to increase scheduled keppra to every 8 hours. Will continue to monitor.  Salomon Mast, RN

## 2013-03-16 DIAGNOSIS — G40401 Other generalized epilepsy and epileptic syndromes, not intractable, with status epilepticus: Secondary | ICD-10-CM

## 2013-03-16 LAB — GLUCOSE, CAPILLARY
Glucose-Capillary: 107 mg/dL — ABNORMAL HIGH (ref 70–99)
Glucose-Capillary: 139 mg/dL — ABNORMAL HIGH (ref 70–99)
Glucose-Capillary: 169 mg/dL — ABNORMAL HIGH (ref 70–99)
Glucose-Capillary: 195 mg/dL — ABNORMAL HIGH (ref 70–99)
Glucose-Capillary: 273 mg/dL — ABNORMAL HIGH (ref 70–99)
Glucose-Capillary: 326 mg/dL — ABNORMAL HIGH (ref 70–99)

## 2013-03-16 LAB — RETICULOCYTES
RBC.: 5.03 MIL/uL (ref 3.87–5.11)
Retic Count, Absolute: 50.3 10*3/uL (ref 19.0–186.0)
Retic Ct Pct: 1 % (ref 0.4–3.1)

## 2013-03-16 LAB — BASIC METABOLIC PANEL
BUN: 7 mg/dL (ref 6–23)
CO2: 22 mEq/L (ref 19–32)
Calcium: 9 mg/dL (ref 8.4–10.5)
Chloride: 106 mEq/L (ref 96–112)
Creatinine, Ser: 0.38 mg/dL — ABNORMAL LOW (ref 0.50–1.10)
GFR calc Af Amer: >90 mL/min (ref >90)
GFR calc non Af Amer: >90 mL/min (ref >90)
Glucose, Bld: 135 mg/dL — ABNORMAL HIGH (ref 70–99)
Potassium: 3.4 mEq/L — ABNORMAL LOW (ref 3.5–5.1)
Sodium: 139 mEq/L (ref 135–145)

## 2013-03-16 LAB — IRON AND TIBC
Iron: 57 ug/dL (ref 42–135)
Saturation Ratios: 15 % — ABNORMAL LOW (ref 20–55)
TIBC: 376 ug/dL (ref 250–470)
UIBC: 319 ug/dL (ref 125–400)

## 2013-03-16 LAB — FOLATE: Folate: >20 ng/mL

## 2013-03-16 LAB — FERRITIN: Ferritin: 61 ng/mL (ref 10–291)

## 2013-03-16 LAB — VITAMIN B12: Vitamin B-12: 345 pg/mL (ref 211–911)

## 2013-03-16 LAB — MAGNESIUM: Magnesium: 1.7 mg/dL (ref 1.5–2.5)

## 2013-03-16 LAB — PREGNANCY, URINE: Preg Test, Ur: NEGATIVE

## 2013-03-16 MED ORDER — ATORVASTATIN CALCIUM 20 MG PO TABS
20.0000 mg | ORAL_TABLET | Freq: Every day | ORAL | Status: DC
  Administered 2013-03-16 – 2013-03-18 (×3): 20 mg via ORAL
  Filled 2013-03-16 (×3): qty 1

## 2013-03-16 MED ORDER — POTASSIUM CHLORIDE CRYS ER 20 MEQ PO TBCR
40.0000 meq | EXTENDED_RELEASE_TABLET | Freq: Once | ORAL | Status: AC
  Administered 2013-03-16: 40 meq via ORAL
  Filled 2013-03-16: qty 2

## 2013-03-16 MED ORDER — CVS NATURAL FISH OIL 1200 MG PO CAPS: 1.0000 | ORAL_CAPSULE | Freq: Two times a day (BID) | ORAL | Status: DC

## 2013-03-16 MED ORDER — POTASSIUM CHLORIDE ER 10 MEQ PO TBCR
10.0000 meq | EXTENDED_RELEASE_TABLET | Freq: Two times a day (BID) | ORAL | Status: DC
  Administered 2013-03-16 – 2013-03-18 (×5): 10 meq via ORAL
  Filled 2013-03-16 (×7): qty 1

## 2013-03-16 MED ORDER — CARVEDILOL 12.5 MG PO TABS: 12.5000 mg | ORAL_TABLET | Freq: Two times a day (BID) | ORAL | Status: DC

## 2013-03-16 MED ORDER — ASPIRIN 325 MG PO TABS
325.0000 mg | ORAL_TABLET | Freq: Every day | ORAL | Status: DC
  Administered 2013-03-16 – 2013-03-18 (×3): 325 mg via ORAL
  Filled 2013-03-16 (×4): qty 1

## 2013-03-16 MED ORDER — INSULIN GLARGINE 100 UNIT/ML ~~LOC~~ SOLN
20.0000 [IU] | Freq: Every day | SUBCUTANEOUS | Status: DC
  Administered 2013-03-16 – 2013-03-17 (×2): 20 [IU] via SUBCUTANEOUS
  Filled 2013-03-16 (×3): qty 0.2

## 2013-03-16 MED ORDER — LOSARTAN POTASSIUM 50 MG PO TABS
100.0000 mg | ORAL_TABLET | Freq: Every day | ORAL | Status: DC
  Administered 2013-03-16 – 2013-03-18 (×3): 100 mg via ORAL
  Filled 2013-03-16 (×4): qty 2

## 2013-03-16 MED ORDER — RANOLAZINE ER 500 MG PO TB12
500.0000 mg | ORAL_TABLET | Freq: Two times a day (BID) | ORAL | Status: DC
  Administered 2013-03-16 – 2013-03-18 (×5): 500 mg via ORAL
  Filled 2013-03-16 (×6): qty 1

## 2013-03-16 MED ORDER — CYANOCOBALAMIN 1000 MCG/ML IJ SOLN
1000.0000 ug | Freq: Every day | INTRAMUSCULAR | Status: DC
  Administered 2013-03-17 – 2013-03-18 (×2): 1000 ug via SUBCUTANEOUS
  Filled 2013-03-16 (×3): qty 1

## 2013-03-16 MED ORDER — ISOSORBIDE MONONITRATE ER 30 MG PO TB24
30.0000 mg | ORAL_TABLET | Freq: Two times a day (BID) | ORAL | Status: DC
  Administered 2013-03-16 – 2013-03-18 (×5): 30 mg via ORAL
  Filled 2013-03-16 (×7): qty 1

## 2013-03-16 MED ORDER — INSULIN ASPART 100 UNIT/ML ~~LOC~~ SOLN
0.0000 [IU] | Freq: Three times a day (TID) | SUBCUTANEOUS | Status: DC
  Administered 2013-03-16: 3 [IU] via SUBCUTANEOUS
  Administered 2013-03-17: 5 [IU] via SUBCUTANEOUS
  Administered 2013-03-17: 2 [IU] via SUBCUTANEOUS
  Administered 2013-03-17 – 2013-03-18 (×3): 3 [IU] via SUBCUTANEOUS
  Administered 2013-03-18: 2 [IU] via SUBCUTANEOUS

## 2013-03-16 MED ORDER — ASPIRIN 325 MG PO TABS: 325.0000 mg | ORAL_TABLET | Freq: Every day | ORAL | Status: DC

## 2013-03-16 MED ORDER — OMEGA-3-ACID ETHYL ESTERS 1 G PO CAPS
1.0000 g | ORAL_CAPSULE | Freq: Two times a day (BID) | ORAL | Status: DC
  Administered 2013-03-16 – 2013-03-18 (×5): 1 g via ORAL
  Filled 2013-03-16 (×7): qty 1

## 2013-03-16 MED ORDER — CARVEDILOL 6.25 MG PO TABS
6.2500 mg | ORAL_TABLET | Freq: Two times a day (BID) | ORAL | Status: DC
  Administered 2013-03-16 – 2013-03-18 (×5): 6.25 mg via ORAL
  Filled 2013-03-16 (×6): qty 1

## 2013-03-16 NOTE — Progress Notes (Signed)
Subjective: No reported seizure activity since early afternoon yesterday. Patient had no new complaints. He is having no current side effects from the increasing Keppra to 1000 mg every 12 hours to every 8 hours. She is also tolerating Dilantin well at 100 mg every 8 hours. Level yesterday was within normal range (corrected to 16.4).  Objective: Current vital signs: BP 147/73  Pulse 94  Temp(Src) 98.7 F (37.1 C) (Oral)  Resp 19  Ht 5\' 2"  (1.575 m)  Wt 68 kg (149 lb 14.6 oz)  BMI 27.41 kg/m2  SpO2 100%  LMP 09/02/2003  Neurologic Exam: Alert and in no acute distress. Patient is well-oriented to time as well as place. Mental status was normal.  Lab Results: Results for orders placed during the hospital encounter of 03/14/13 (from the past 48 hour(s))  COMPREHENSIVE METABOLIC PANEL     Status: Abnormal   Collection Time    03/14/13 10:00 AM      Result Value Range   Sodium 140  135 - 145 mEq/L   Potassium 2.9 (*) 3.5 - 5.1 mEq/L   Chloride 104  96 - 112 mEq/L   CO2 23  19 - 32 mEq/L   Glucose, Bld 161 (*) 70 - 99 mg/dL   BUN 28 (*) 6 - 23 mg/dL   Creatinine, Ser 2.95  0.50 - 1.10 mg/dL   Calcium 8.8  8.4 - 62.1 mg/dL   Total Protein 6.0  6.0 - 8.3 g/dL   Albumin 2.8 (*) 3.5 - 5.2 g/dL   AST 13  0 - 37 U/L   ALT 14  0 - 35 U/L   Alkaline Phosphatase 73  39 - 117 U/L   Total Bilirubin 0.3  0.3 - 1.2 mg/dL   GFR calc non Af Amer >90  >90 mL/min   GFR calc Af Amer >90  >90 mL/min   Comment:            The eGFR has been calculated     using the CKD EPI equation.     This calculation has not been     validated in all clinical     situations.     eGFR's persistently     <90 mL/min signify     possible Chronic Kidney Disease.  MAGNESIUM     Status: None   Collection Time    03/14/13 10:00 AM      Result Value Range   Magnesium 1.5  1.5 - 2.5 mg/dL  PHOSPHORUS     Status: None   Collection Time    03/14/13 10:00 AM      Result Value Range   Phosphorus 3.5  2.3 - 4.6  mg/dL  CBC     Status: Abnormal   Collection Time    03/14/13 10:00 AM      Result Value Range   WBC 8.0  4.0 - 10.5 K/uL   RBC 5.01  3.87 - 5.11 MIL/uL   Hemoglobin 11.1 (*) 12.0 - 15.0 g/dL   HCT 30.8 (*) 65.7 - 84.6 %   MCV 71.3 (*) 78.0 - 100.0 fL   MCH 22.2 (*) 26.0 - 34.0 pg   MCHC 31.1  30.0 - 36.0 g/dL   RDW 96.2 (*) 95.2 - 84.1 %   Platelets 228  150 - 400 K/uL  ETHANOL     Status: None   Collection Time    03/14/13 10:00 AM      Result Value Range   Alcohol, Ethyl (B) <  11  0 - 11 mg/dL   Comment:            LOWEST DETECTABLE LIMIT FOR     SERUM ALCOHOL IS 11 mg/dL     FOR MEDICAL PURPOSES ONLY  POCT I-STAT 3, BLOOD GAS (G3+)     Status: Abnormal   Collection Time    03/14/13 10:00 AM      Result Value Range   pH, Arterial 7.374  7.350 - 7.450   pCO2 arterial 38.5  35.0 - 45.0 mmHg   pO2, Arterial 118.0 (*) 80.0 - 100.0 mmHg   Bicarbonate 22.7  20.0 - 24.0 mEq/L   TCO2 24  0 - 100 mmol/L   O2 Saturation 99.0     Acid-base deficit 3.0 (*) 0.0 - 2.0 mmol/L   Patient temperature 97.3 F     Collection site RADIAL, ALLEN'S TEST ACCEPTABLE     Sample type ARTERIAL    GLUCOSE, CAPILLARY     Status: Abnormal   Collection Time    03/14/13 10:01 AM      Result Value Range   Glucose-Capillary 156 (*) 70 - 99 mg/dL  AMMONIA     Status: None   Collection Time    03/14/13  3:38 PM      Result Value Range   Ammonia 36  11 - 60 umol/L  CBC     Status: Abnormal   Collection Time    03/14/13  3:38 PM      Result Value Range   WBC 11.0 (*) 4.0 - 10.5 K/uL   RBC 4.95  3.87 - 5.11 MIL/uL   Hemoglobin 10.9 (*) 12.0 - 15.0 g/dL   HCT 16.1 (*) 09.6 - 04.5 %   MCV 71.9 (*) 78.0 - 100.0 fL   MCH 22.0 (*) 26.0 - 34.0 pg   MCHC 30.6  30.0 - 36.0 g/dL   RDW 40.9 (*) 81.1 - 91.4 %   Platelets 210  150 - 400 K/uL  CREATININE, SERUM     Status: None   Collection Time    03/14/13  3:38 PM      Result Value Range   Creatinine, Ser 0.50  0.50 - 1.10 mg/dL   GFR calc non Af Amer  >90  >90 mL/min   GFR calc Af Amer >90  >90 mL/min   Comment:            The eGFR has been calculated     using the CKD EPI equation.     This calculation has not been     validated in all clinical     situations.     eGFR's persistently     <90 mL/min signify     possible Chronic Kidney Disease.  MRSA PCR SCREENING     Status: None   Collection Time    03/14/13  4:07 PM      Result Value Range   MRSA by PCR NEGATIVE  NEGATIVE   Comment:            The GeneXpert MRSA Assay (FDA     approved for NASAL specimens     only), is one component of a     comprehensive MRSA colonization     surveillance program. It is not     intended to diagnose MRSA     infection nor to guide or     monitor treatment for     MRSA infections.  GLUCOSE, CAPILLARY     Status:  Abnormal   Collection Time    03/14/13  9:51 PM      Result Value Range   Glucose-Capillary 124 (*) 70 - 99 mg/dL  COMPREHENSIVE METABOLIC PANEL     Status: Abnormal   Collection Time    03/15/13  5:40 AM      Result Value Range   Sodium 142  135 - 145 mEq/L   Potassium 3.3 (*) 3.5 - 5.1 mEq/L   Chloride 110  96 - 112 mEq/L   CO2 21  19 - 32 mEq/L   Glucose, Bld 94  70 - 99 mg/dL   BUN 18  6 - 23 mg/dL   Creatinine, Ser 8.29 (*) 0.50 - 1.10 mg/dL   Calcium 9.0  8.4 - 56.2 mg/dL   Total Protein 5.7 (*) 6.0 - 8.3 g/dL   Albumin 2.8 (*) 3.5 - 5.2 g/dL   AST 18  0 - 37 U/L   ALT 14  0 - 35 U/L   Alkaline Phosphatase 70  39 - 117 U/L   Total Bilirubin 0.2 (*) 0.3 - 1.2 mg/dL   GFR calc non Af Amer >90  >90 mL/min   GFR calc Af Amer >90  >90 mL/min   Comment:            The eGFR has been calculated     using the CKD EPI equation.     This calculation has not been     validated in all clinical     situations.     eGFR's persistently     <90 mL/min signify     possible Chronic Kidney Disease.  CBC     Status: Abnormal   Collection Time    03/15/13  5:40 AM      Result Value Range   WBC 6.4  4.0 - 10.5 K/uL   RBC  4.92  3.87 - 5.11 MIL/uL   Hemoglobin 10.9 (*) 12.0 - 15.0 g/dL   HCT 13.0 (*) 86.5 - 78.4 %   MCV 72.8 (*) 78.0 - 100.0 fL   MCH 22.2 (*) 26.0 - 34.0 pg   MCHC 30.4  30.0 - 36.0 g/dL   RDW 69.6 (*) 29.5 - 28.4 %   Platelets 203  150 - 400 K/uL  PHENYTOIN LEVEL, TOTAL     Status: None   Collection Time    03/15/13  8:55 AM      Result Value Range   Phenytoin Lvl 10.8  10.0 - 20.0 ug/mL  GLUCOSE, CAPILLARY     Status: Abnormal   Collection Time    03/15/13  3:48 PM      Result Value Range   Glucose-Capillary 137 (*) 70 - 99 mg/dL  GLUCOSE, CAPILLARY     Status: Abnormal   Collection Time    03/15/13  8:44 PM      Result Value Range   Glucose-Capillary 134 (*) 70 - 99 mg/dL  GLUCOSE, CAPILLARY     Status: Abnormal   Collection Time    03/15/13 11:45 PM      Result Value Range   Glucose-Capillary 107 (*) 70 - 99 mg/dL  BASIC METABOLIC PANEL     Status: Abnormal   Collection Time    03/16/13  4:35 AM      Result Value Range   Sodium 139  135 - 145 mEq/L   Potassium 3.4 (*) 3.5 - 5.1 mEq/L   Chloride 106  96 - 112 mEq/L   CO2 22  19 - 32 mEq/L   Glucose, Bld 135 (*) 70 - 99 mg/dL   BUN 7  6 - 23 mg/dL   Comment: DELTA CHECK NOTED   Creatinine, Ser 0.38 (*) 0.50 - 1.10 mg/dL   Calcium 9.0  8.4 - 91.4 mg/dL   GFR calc non Af Amer >90  >90 mL/min   GFR calc Af Amer >90  >90 mL/min   Comment:            The eGFR has been calculated     using the CKD EPI equation.     This calculation has not been     validated in all clinical     situations.     eGFR's persistently     <90 mL/min signify     possible Chronic Kidney Disease.  RETICULOCYTES     Status: None   Collection Time    03/16/13  4:35 AM      Result Value Range   Retic Ct Pct 1.0  0.4 - 3.1 %   RBC. 5.03  3.87 - 5.11 MIL/uL   Retic Count, Manual 50.3  19.0 - 186.0 K/uL  MAGNESIUM     Status: None   Collection Time    03/16/13  4:35 AM      Result Value Range   Magnesium 1.7  1.5 - 2.5 mg/dL  GLUCOSE,  CAPILLARY     Status: Abnormal   Collection Time    03/16/13  4:39 AM      Result Value Range   Glucose-Capillary 139 (*) 70 - 99 mg/dL  GLUCOSE, CAPILLARY     Status: Abnormal   Collection Time    03/16/13  7:40 AM      Result Value Range   Glucose-Capillary 169 (*) 70 - 99 mg/dL   Comment 1 Notify RN      Studies/Results: Ct Head Wo Contrast  03/14/2013   *RADIOLOGY REPORT*  Clinical Data: Altered mental status, seizure  CT HEAD WITHOUT CONTRAST  Technique:  Contiguous axial images were obtained from the base of the skull through the vertex without contrast.  Comparison: 03/02/2013  Findings: No skull fracture is noted.  Paranasal sinuses and mastoid air cells are unremarkable.  No intracranial hemorrhage, mass effect or midline shift.  Stable encephalomalacia in the right parietal region.  No acute cortical infarction.  No mass lesion is noted on this unenhanced scan.  IMPRESSION: No acute intracranial abnormality.  Stable encephalomalacia in the right parietal region.   Original Report Authenticated By: Natasha Mead, M.D.    Medications:  I have reviewed the patient's current medications. Scheduled: . antiseptic oral rinse  15 mL Mouth Rinse BID  . aspirin  300 mg Rectal Daily  . heparin  5,000 Units Subcutaneous Q8H  . insulin aspart  0-15 Units Subcutaneous Q4H  . insulin glargine  10 Units Subcutaneous QHS  . levETIRAcetam  1,000 mg Intravenous Q8H  . metoprolol  2.5 mg Intravenous Q6H  . phenytoin (DILANTIN) IV  100 mg Intravenous Q8H  . sodium chloride  3 mL Intravenous Q12H   Continuous: . dextrose 5 % and 0.9 % NaCl with KCl 20 mEq/L 1,000 mL (03/15/13 1446)   PRN:  Assessment/Plan: Partial seizure disorder with breakthrough partial seizures, currently controlled on combination of Dilantin and Keppra.  Recommend no changes in current management. We will continue to follow this patient with you. No contraindications to returning to rehabilitation setting.  C.R.  Roseanne Reno, MD [facility]  03/16/2013  8:55 AM

## 2013-03-16 NOTE — Progress Notes (Signed)
Report called to Etheleen Mayhew RN. Pt to transfer to 4N-28 via wheelchair, no O2, IV NSL. Family and belongings at bedside. VS stable at time of transfer. Meds in chart. No current questions or complaints at this time. Fernado Brigante L

## 2013-03-16 NOTE — Progress Notes (Signed)
[facility] Progress Note [place] TEAM 1 - Stepdown/ICU TEAM   Jo-Ann Johanning Hoelzer WUJ:[id number] DOB: 11/13/1955 DOA: 03/14/2013 PCP: [name], MD  Brief narrative: 57 y.o. female who was recently d/c to [facility]. Family was told that patient started to have seizure epiodes about 7-8 min apart. She had been getting up but falling more at [facility] per family.   Work-up last hospital stay (6/3 - 6/6) included MRI (no acute findings per Neuro) and EEG showing only one episode of possible seizure activity despite continuous EEG monitoring in setting of multiple "seizure" episodes.  Because of suspected pseudoseizures Psych was also consulted during that admission, but did not recommend inpt Psych hospitalization.  She was d/c on Keppra 750 bid with plan to follow closely with Neurology.   In the ED she was loaded with Keppra 1000 mg and was given 1mg  of ativan and 5 mg versed as well.  She had 2 episodes lasitng about 30 seconds of gen shaking of upper extremities, with a fixed stare and shaking during the admitting doctor's exam.   Assessment/Plan:  Focal seizure disorder with recurrent focal seizure activity  Ongoing care and med adjustments per Neurology - clinically significantly improved today  Mild Hypokalemia Likely due to poor intake - magnesium normal - continue to replace   Hx CAD s/p CABG x5 2007 Low risk stress nuclear study May 2014 ([place]) - asymptomatic   OSA QHS BIPAP per home regimen  Fibromyalgia / Chronic pain  reports she is comfortable at this time  DM 2 CBG erratic at present  - gently adjust treatment regimen and follow trend   Tobacco abuse - 2PPD ongoing w/ chronic bronchitis   HTN Reasonably controlled at the present time - continue to follow with no change in medical regimen  HLD Resume usual tx regimen  Remote hx of CVA R parietal region  Microcytic anemia Anemia panel not c/w Fe deficiency - B12 is borderline, so  will replete x3 days   Code Status: RULL Family Communication: Spoke with patient directly  Disposition Plan: transfer to Neuro bed  Consultants: Neurology  Procedures: None  Antibiotics: None  DVT prophylaxis: Subcutaneous heparin  HPI/Subjective: The patient is alert and interactive today.  She has no specific complaints other than being hungry.  She denies chest pain nausea vomiting or abdominal pain.    Objective: Blood pressure 128/76, pulse 90, temperature 98.4 F (36.9 C), temperature source Oral, resp. rate 20, height 5\' 2"  (1.575 m), weight 68 kg (149 lb 14.6 oz), last menstrual period 09/02/2003, SpO2 96.00%.  Intake/Output Summary (Last 24 hours) at 03/16/13 1727 Last data filed at 03/16/13 1450  Gross per 24 hour  Intake   2313 ml  Output   2350 ml  Net    -37 ml    Exam: General: No acute respiratory distress - alert and oriented Lungs: Clear to auscultation bilaterally without wheezes or crackles Cardiovascular: Regular rate and rhythm without murmur gallop or rub normal S1 and S2 Abdomen: Nontender, nondistended, soft, bowel sounds positive, no rebound, no ascites, no appreciable mass Extremities: No significant cyanosis, clubbing, or edema bilateral lower extremities  Data Reviewed: Basic Metabolic Panel:  Recent Labs Lab 03/14/13 1000 03/14/13 1538 03/15/13 0540 03/16/13 0435  NA 140  --  142 139  K 2.9*  --  3.3* 3.4*  CL 104  --  110 106  CO2 23  --  21 22  GLUCOSE 161*  --  94 135*  BUN  28*  --  18 7  CREATININE 0.65 0.50 0.44* 0.38*  CALCIUM 8.8  --  9.0 9.0  MG 1.5  --   --  1.7  PHOS 3.5  --   --   --    Liver Function Tests:  Recent Labs Lab 03/14/13 1000 03/15/13 0540  AST 13 18  ALT 14 14  ALKPHOS 73 70  BILITOT 0.3 0.2*  PROT 6.0 5.7*  ALBUMIN 2.8* 2.8*    Recent Labs Lab 03/14/13 1538  AMMONIA 36   CBC:  Recent Labs Lab 03/14/13 1000 03/14/13 1538 03/15/13 0540  WBC 8.0 11.0* 6.4  HGB 11.1* 10.9* 10.9*   HCT 35.7* 35.6* 35.8*  MCV 71.3* 71.9* 72.8*  PLT 228 210 203   CBG:  Recent Labs Lab 03/15/13 2345 03/16/13 0439 03/16/13 0740 03/16/13 1200 03/16/13 1655  GLUCAP 107* 139* 169* 326* 195*    Recent Results (from the past 240 hour(s))  MRSA PCR SCREENING     Status: None   Collection Time    03/14/13  4:07 PM      Result Value Range Status   MRSA by PCR NEGATIVE  NEGATIVE Final   Comment:            The GeneXpert MRSA Assay (FDA     approved for NASAL specimens     only), is one component of a     comprehensive MRSA colonization     surveillance program. It is not     intended to diagnose MRSA     infection nor to guide or     monitor treatment for     MRSA infections.     Studies:  Recent x-ray studies have been reviewed in detail by the Attending Physician  Scheduled Meds:  Scheduled Meds: . antiseptic oral rinse  15 mL Mouth Rinse BID  . aspirin  325 mg Oral Daily  . atorvastatin  20 mg Oral q1800  . carvedilol  6.25 mg Oral BID WC  . heparin  5,000 Units Subcutaneous Q8H  . insulin aspart  0-15 Units Subcutaneous TID WC  . insulin glargine  10 Units Subcutaneous QHS  . isosorbide mononitrate  30 mg Oral BID  . levETIRAcetam  1,000 mg Intravenous Q8H  . losartan  100 mg Oral Daily  . omega-3 acid ethyl esters  1 g Oral BID  . phenytoin (DILANTIN) IV  100 mg Intravenous Q8H  . potassium chloride  10 mEq Oral BID  . ranolazine  500 mg Oral BID  . sodium chloride  3 mL Intravenous Q12H    Time spent on care of this patient:   [facility] T  [facility] Office  [phone] Pager - Text Page per Loretha Stapler as per below:  On-Call/Text Page:      [email or web address]      password [email or web address]  If 7PM-7AM, please contact night-coverage [email or web address] Password [email or web address] 03/16/2013, 5:27 PM   LOS: 2 days

## 2013-03-16 NOTE — Progress Notes (Signed)
Nutrition Brief Note  Malnutrition Screening Tool result is inaccurate.  Please consult if nutrition needs are identified.  Hollie Bartus, MS RD LDN Clinical Inpatient Dietitian Pager: [phone] Weekend/After hours pager: [phone]   

## 2013-03-17 LAB — GLUCOSE, CAPILLARY
Glucose-Capillary: 161 mg/dL — ABNORMAL HIGH (ref 70–99)
Glucose-Capillary: 200 mg/dL — ABNORMAL HIGH (ref 70–99)
Glucose-Capillary: 246 mg/dL — ABNORMAL HIGH (ref 70–99)
Glucose-Capillary: 217 mg/dL — ABNORMAL HIGH (ref 70–99)

## 2013-03-17 LAB — BASIC METABOLIC PANEL
BUN: 4 mg/dL — ABNORMAL LOW (ref 6–23)
CO2: 23 mEq/L (ref 19–32)
Calcium: 9.4 mg/dL (ref 8.4–10.5)
Chloride: 104 mEq/L (ref 96–112)
Creatinine, Ser: 0.41 mg/dL — ABNORMAL LOW (ref 0.50–1.10)
GFR calc Af Amer: >90 mL/min (ref >90)
GFR calc non Af Amer: >90 mL/min (ref >90)
Glucose, Bld: 182 mg/dL — ABNORMAL HIGH (ref 70–99)
Potassium: 3.8 mEq/L (ref 3.5–5.1)
Sodium: 137 mEq/L (ref 135–145)

## 2013-03-17 MED ORDER — LEVETIRACETAM 500 MG PO TABS
1000.0000 mg | ORAL_TABLET | Freq: Three times a day (TID) | ORAL | Status: DC
  Administered 2013-03-17 – 2013-03-18 (×4): 1000 mg via ORAL
  Filled 2013-03-17 (×7): qty 2

## 2013-03-17 MED ORDER — PHENYTOIN 50 MG PO CHEW
100.0000 mg | CHEWABLE_TABLET | Freq: Three times a day (TID) | ORAL | Status: DC
  Administered 2013-03-17 – 2013-03-18 (×5): 100 mg via ORAL
  Filled 2013-03-17 (×6): qty 2

## 2013-03-17 NOTE — Clinical Social Work Note (Signed)
Clinical Social Work Department BRIEF PSYCHOSOCIAL ASSESSMENT 03/16/2013  Patient:  Diane [name], Diane [name]     Account Number:  [id number]     Admit date:  03/14/2013  Clinical Social Worker:  Verl Blalock  Date/Time:  03/16/2013 05:30 PM  Referred by:  RN  Date Referred:  03/16/2013 Referred for  Psychosocial assessment   Other Referral:   Interview type:  Patient Other interview type:   No family currently present at bedside    PSYCHOSOCIAL DATA Living Status:  FACILITY Admitted from facility:  [facility] Level of care:  Skilled Nursing Facility Primary support name:  Diane [name]  [phone] Primary support relationship to patient:  SIBLING Degree of support available:   Fair    CURRENT CONCERNS Current Concerns  Post-Acute Placement   Other Concerns:    SOCIAL WORK ASSESSMENT / PLAN Clinical Social Worker met with patient at bedside to offer support and discuss patient needs at discharge.  Patient states that she is a current resident of [facility] and would not like to return but understands that her options are limited.  CSW has completed documentation and sent clinical information to [facility].  Patient agreeable with plan at this time.  CSW to remain available for support and to facilitate patient discharge needs once medically ready.   Assessment/plan status:  Psychosocial Support/Ongoing Assessment of Needs Other assessment/ plan:   Information/referral to community resources:   Patient refused all additional resources for alternative facility options    PATIENT'S/FAMILY'S RESPONSE TO PLAN OF CARE: Patient alert and oriented x3 sitting up in bed.  Patient is from [facility] and only been a resident a week.  Patient is not happy about return but is aware that she is unable to return home at this time.  Patient states "it will do no good for you to call my family."  CSW has made no family contact, therefore unable to  determine patient support.

## 2013-03-17 NOTE — Progress Notes (Signed)
[facility] PROGRESS NOTE  Diane [name] AVW:[id number] DOB: October 13, 1955 DOA: 03/14/2013 PCP: [name], MD  Assessment/Plan: Principal Problem:   Partial seizures Active Problems:   DIABETES MELLITUS, TYPE II   OBSTRUCTIVE SLEEP APNEA   MIGRAINE HEADACHE   HYPERTENSION   CVA   FIBROMYALGIA   Other dysphagia    Brief narrative:  57 y.o. female who was recently d/c to [facility]. Family was told that patient started to have seizure epiodes about 7-8 min apart. She had been getting up but falling more at [facility] per family.  Work-up last hospital stay (6/3 - 6/6) included MRI (no acute findings per Neuro) and EEG showing only one episode of possible seizure activity despite continuous EEG monitoring in setting of multiple "seizure" episodes. Because of suspected pseudoseizures Psych was also consulted during that admission, but did not recommend inpt Psych hospitalization. She was d/c on Keppra 750 bid with plan to follow closely with Neurology.  In the ED she was loaded with Keppra 1000 mg and was given 1mg  of ativan and 5 mg versed as well. She had 2 episodes lasitng about 30 seconds of gen shaking of upper extremities, with a fixed stare and shaking during the admitting doctor's exam.   Assessment/Plan:  Focal seizure disorder with recurrent focal seizure activity  Ongoing care and med adjustments per Neurology - clinically significantly improved today  Switched to oral Keppra and Dilantin this morning  Mild Hypokalemia  Likely due to poor intake - magnesium normal - continue to replace  Hx CAD s/p CABG x5 2007  Low risk stress nuclear study May 2014 ([facility]) - asymptomatic  OSA  QHS BIPAP per home regimen  Fibromyalgia / Chronic pain  reports she is comfortable at this time  DM 2  CBG erratic at present - gently adjust treatment regimen and follow trend  Tobacco abuse - 2PPD ongoing w/ chronic bronchitis  HTN  Reasonably controlled at the  present time - continue to follow with no change in medical regimen  HLD  Resume usual tx regimen  Remote hx of CVA R parietal region  Microcytic anemia  Anemia panel not c/w Fe deficiency - B12 is borderline, so will replete x3 days  Code Status: RULL  Family Communication: Spoke with patient directly  Disposition Plan: PT/OT evaluation pending  Consultants:  Neurology  Procedures:  None  Antibiotics:  None  DVT prophylaxis:  Subcutaneous heparin    HPI/Subjective:  The patient is alert and interactive today. She has no specific complaints other than being hungry. She denies chest pain nausea vomiting or abdominal pain.      Objective: Filed Vitals:   03/16/13 1541 03/16/13 1650 03/16/13 2200 03/17/13 0600  BP: 140/74 128/76 137/77 143/65  Pulse: 94 90 86 88  Temp: 98.1 F (36.7 C) 98.4 F (36.9 C) 98.4 F (36.9 C) 98.1 F (36.7 C)  TempSrc: Axillary Oral Oral Oral  Resp: 26 20 20 20   Height:      Weight:      SpO2: 96% 96% 98% 97%    Intake/Output Summary (Last 24 hours) at 03/17/13 1047 Last data filed at 03/17/13 0800  Gross per 24 hour  Intake    880 ml  Output   1100 ml  Net   -220 ml    Exam:  Alert and in no acute distress. Patient is well-oriented to time as well as place.  Speech was normal.  Face was symmetrical with no signs of weakness.  Moved  extremities well with no signs of acute focal deficit.      Cardiovascular: Normal rate, regular rhythm, normal heart sounds and intact distal pulses.  Pulmonary/Chest: Effort normal and breath sounds normal. No respiratory distress.  Abdominal: Soft. Normal appearance and bowel sounds are normal. She exhibits no distension. There is no tenderness.  Musculoskeletal: She exhibits no edema and no tenderness.  Neurological: She is alert. No cranial nerve deficit.    Data Reviewed: Basic Metabolic Panel:  Recent Labs Lab 03/14/13 1000 03/14/13 1538 03/15/13 0540 03/16/13 0435 03/17/13 0458   NA 140  --  142 139 137  K 2.9*  --  3.3* 3.4* 3.8  CL 104  --  110 106 104  CO2 23  --  21 22 23   GLUCOSE 161*  --  94 135* 182*  BUN 28*  --  18 7 4*  CREATININE 0.65 0.50 0.44* 0.38* 0.41*  CALCIUM 8.8  --  9.0 9.0 9.4  MG 1.5  --   --  1.7  --   PHOS 3.5  --   --   --   --     Liver Function Tests:  Recent Labs Lab 03/14/13 1000 03/15/13 0540  AST 13 18  ALT 14 14  ALKPHOS 73 70  BILITOT 0.3 0.2*  PROT 6.0 5.7*  ALBUMIN 2.8* 2.8*   No results found for this basename: LIPASE, AMYLASE,  in the last 168 hours  Recent Labs Lab 03/14/13 1538  AMMONIA 36    CBC:  Recent Labs Lab 03/14/13 1000 03/14/13 1538 03/15/13 0540  WBC 8.0 11.0* 6.4  HGB 11.1* 10.9* 10.9*  HCT 35.7* 35.6* 35.8*  MCV 71.3* 71.9* 72.8*  PLT 228 210 203    Cardiac Enzymes: No results found for this basename: CKTOTAL, CKMB, CKMBINDEX, TROPONINI,  in the last 168 hours BNP (last 3 results) No results found for this basename: PROBNP,  in the last 8760 hours   CBG:  Recent Labs Lab 03/16/13 0740 03/16/13 1200 03/16/13 1655 03/16/13 2131 03/17/13 0636  GLUCAP 169* 326* 195* 273* 161*    Recent Results (from the past 240 hour(s))  MRSA PCR SCREENING     Status: None   Collection Time    03/14/13  4:07 PM      Result Value Range Status   MRSA by PCR NEGATIVE  NEGATIVE Final   Comment:            The GeneXpert MRSA Assay (FDA     approved for NASAL specimens     only), is one component of a     comprehensive MRSA colonization     surveillance program. It is not     intended to diagnose MRSA     infection nor to guide or     monitor treatment for     MRSA infections.     Studies: Ct Head Wo Contrast  03/14/2013   *RADIOLOGY REPORT*  Clinical Data: Altered mental status, seizure  CT HEAD WITHOUT CONTRAST  Technique:  Contiguous axial images were obtained from the base of the skull through the vertex without contrast.  Comparison: 03/02/2013  Findings: No skull fracture is  noted.  Paranasal sinuses and mastoid air cells are unremarkable.  No intracranial hemorrhage, mass effect or midline shift.  Stable encephalomalacia in the right parietal region.  No acute cortical infarction.  No mass lesion is noted on this unenhanced scan.  IMPRESSION: No acute intracranial abnormality.  Stable encephalomalacia in the right  parietal region.   Original Report Authenticated By: Natasha Mead, M.D.   Ct Head (brain) Wo Contrast  03/03/2013   *RADIOLOGY REPORT*  Clinical Data: Slurred speech.  Right-sided facial droop.  CT HEAD WITHOUT CONTRAST  Technique:  Contiguous axial images were obtained from the base of the skull through the vertex without contrast.  Comparison: Head CT 11/02/2010.  Findings: Well defined area of decreased attenuation in the right frontoparietal region is unchanged, compatible with encephalomalacia.  No acute intracranial abnormality. Specifically, no definite signs of acute/subacute cerebral ischemia, no evidence of acute intracranial hemorrhage, no mass, mass effect or hydrocephalous.  Mild decreased attenuation in the periventricular white matter of the cerebral hemispheres bilaterally, compatible with mild chronic microvascular ischemic disease.  No acute displaced skull fractures are identified. Visualized paranasal sinuses and mastoids are well pneumatized.  IMPRESSION: 1.  No acute intracranial abnormalities. 2.  Encephalomalacia in the right frontoparietal region related to prior infarction. 3.  Mild chronic microvascular ischemic disease of the periventricular white matter of the cerebral hemispheres bilaterally.   Original Report Authenticated By: Trudie Reed, M.D.   Mr Brain Wo Contrast  03/03/2013   *RADIOLOGY REPORT*  Clinical Data: Staring spells (question seizures).  Hypertensive hyperlipidemic diabetic patient.  MRI HEAD WITHOUT CONTRAST  Technique:  Multiplanar, multiecho pulse sequences of the brain and surrounding structures were obtained according to  standard protocol without intravenous contrast.  Comparison: 03/02/2013 and 07/26/2010 CT.  No comparison MR.  Findings: Motion degraded exam.  Tiny acute non hemorrhagic posterior left frontal lobe infarct.  Moderate size area of encephalomalacia in the right parietal/periatrial region unchanged from prior CTs and most likely related to result of prior infarct.  No intracranial hemorrhage.  Mild small vessel disease type changes.  In the right hippocampal region, tiny cysts are noted probably incidental finding rather than primary mass or evidence of mesial temporal sclerosis.  Global atrophy without hydrocephalus.  Major intracranial vascular structures are patent. Ectatic basilar artery impresses the undersurface of the hypothalamic region.  Cervical medullary junction, pituitary region, pineal region and orbital structures unremarkable.  IMPRESSION:  Tiny acute non hemorrhagic posterior left frontal lobe infarct.  Please see above.  This has been made a PRA call report utilizing dashboard call feature.   Original Report Authenticated By: Lacy Duverney, M.D.    Scheduled Meds: . antiseptic oral rinse  15 mL Mouth Rinse BID  . aspirin  325 mg Oral Daily  . atorvastatin  20 mg Oral q1800  . carvedilol  6.25 mg Oral BID WC  . cyanocobalamin  1,000 mcg Subcutaneous QPC supper  . heparin  5,000 Units Subcutaneous Q8H  . insulin aspart  0-15 Units Subcutaneous TID WC  . insulin glargine  20 Units Subcutaneous QHS  . isosorbide mononitrate  30 mg Oral BID  . levETIRAcetam  1,000 mg Intravenous Q8H  . losartan  100 mg Oral Daily  . omega-3 acid ethyl esters  1 g Oral BID  . phenytoin (DILANTIN) IV  100 mg Intravenous Q8H  . potassium chloride  10 mEq Oral BID  . ranolazine  500 mg Oral BID  . sodium chloride  3 mL Intravenous Q12H   Continuous Infusions:   Principal Problem:   Partial seizures Active Problems:   DIABETES MELLITUS, TYPE II   OBSTRUCTIVE SLEEP APNEA   MIGRAINE HEADACHE    HYPERTENSION   CVA   FIBROMYALGIA   Other dysphagia    Time spent: 40 minutes   [facility]  [facility] Pager  [phone]. If 8PM-8AM, please contact night-coverage at [email or web address], password [facility] 03/17/2013, 10:47 AM  LOS: 3 days

## 2013-03-17 NOTE — Progress Notes (Signed)
Pt has refused CPAP for the night, RT to monitor and assess as needed.  

## 2013-03-17 NOTE — Progress Notes (Signed)
Subjective: No new complaints. Patient has not had a recurrent seizure. No overnight adverse events were reported. She's tolerating Dilantin and Keppra well at current doses with no apparent side effects.  Objective: Current vital signs: BP 143/65  Pulse 88  Temp(Src) 98.1 F (36.7 C) (Oral)  Resp 20  Ht 5\' 2"  (1.575 m)  Wt 68 kg (149 lb 14.6 oz)  BMI 27.41 kg/m2  SpO2 97%  LMP 09/02/2003  Neurologic Exam: Alert and in no acute distress. Patient is well-oriented to time as well as place. Speech was normal. Face was symmetrical with no signs of weakness. Moved extremities well with no signs of acute focal deficit.  Lab Results: Results for orders placed during the hospital encounter of 03/14/13 (from the past 48 hour(s))  GLUCOSE, CAPILLARY     Status: Abnormal   Collection Time    03/15/13  3:48 PM      Result Value Range   Glucose-Capillary 137 (*) 70 - 99 mg/dL  GLUCOSE, CAPILLARY     Status: Abnormal   Collection Time    03/15/13  8:44 PM      Result Value Range   Glucose-Capillary 134 (*) 70 - 99 mg/dL  GLUCOSE, CAPILLARY     Status: Abnormal   Collection Time    03/15/13 11:45 PM      Result Value Range   Glucose-Capillary 107 (*) 70 - 99 mg/dL  BASIC METABOLIC PANEL     Status: Abnormal   Collection Time    03/16/13  4:35 AM      Result Value Range   Sodium 139  135 - 145 mEq/L   Potassium 3.4 (*) 3.5 - 5.1 mEq/L   Chloride 106  96 - 112 mEq/L   CO2 22  19 - 32 mEq/L   Glucose, Bld 135 (*) 70 - 99 mg/dL   BUN 7  6 - 23 mg/dL   Comment: DELTA CHECK NOTED   Creatinine, Ser 0.38 (*) 0.50 - 1.10 mg/dL   Calcium 9.0  8.4 - 16.1 mg/dL   GFR calc non Af Amer >90  >90 mL/min   GFR calc Af Amer >90  >90 mL/min   Comment:            The eGFR has been calculated     using the CKD EPI equation.     This calculation has not been     validated in all clinical     situations.     eGFR's persistently     <90 mL/min signify     possible Chronic Kidney Disease.   VITAMIN B12     Status: None   Collection Time    03/16/13  4:35 AM      Result Value Range   Vitamin B-12 345  211 - 911 pg/mL  FOLATE     Status: None   Collection Time    03/16/13  4:35 AM      Result Value Range   Folate >20.0     Comment: (NOTE)     Reference Ranges            Deficient:       0.4 - 3.3 ng/mL            Indeterminate:   3.4 - 5.4 ng/mL            Normal:              > 5.4 ng/mL  IRON AND TIBC  Status: Abnormal   Collection Time    03/16/13  4:35 AM      Result Value Range   Iron 57  42 - 135 ug/dL   TIBC 914  782 - 956 ug/dL   Saturation Ratios 15 (*) 20 - 55 %   UIBC 319  125 - 400 ug/dL  FERRITIN     Status: None   Collection Time    03/16/13  4:35 AM      Result Value Range   Ferritin 61  10 - 291 ng/mL  RETICULOCYTES     Status: None   Collection Time    03/16/13  4:35 AM      Result Value Range   Retic Ct Pct 1.0  0.4 - 3.1 %   RBC. 5.03  3.87 - 5.11 MIL/uL   Retic Count, Manual 50.3  19.0 - 186.0 K/uL  MAGNESIUM     Status: None   Collection Time    03/16/13  4:35 AM      Result Value Range   Magnesium 1.7  1.5 - 2.5 mg/dL  GLUCOSE, CAPILLARY     Status: Abnormal   Collection Time    03/16/13  4:39 AM      Result Value Range   Glucose-Capillary 139 (*) 70 - 99 mg/dL  GLUCOSE, CAPILLARY     Status: Abnormal   Collection Time    03/16/13  7:40 AM      Result Value Range   Glucose-Capillary 169 (*) 70 - 99 mg/dL   Comment 1 Notify RN    GLUCOSE, CAPILLARY     Status: Abnormal   Collection Time    03/16/13 12:00 PM      Result Value Range   Glucose-Capillary 326 (*) 70 - 99 mg/dL   Comment 1 Notify RN    PREGNANCY, URINE     Status: None   Collection Time    03/16/13 12:45 PM      Result Value Range   Preg Test, Ur NEGATIVE  NEGATIVE   Comment:            THE SENSITIVITY OF THIS     METHODOLOGY IS >20 mIU/mL.  GLUCOSE, CAPILLARY     Status: Abnormal   Collection Time    03/16/13  4:55 PM      Result Value Range    Glucose-Capillary 195 (*) 70 - 99 mg/dL   Comment 1 Documented in Chart     Comment 2 Notify RN    GLUCOSE, CAPILLARY     Status: Abnormal   Collection Time    03/16/13  9:31 PM      Result Value Range   Glucose-Capillary 273 (*) 70 - 99 mg/dL   Comment 1 Documented in Chart     Comment 2 Notify RN    BASIC METABOLIC PANEL     Status: Abnormal   Collection Time    03/17/13  4:58 AM      Result Value Range   Sodium 137  135 - 145 mEq/L   Potassium 3.8  3.5 - 5.1 mEq/L   Chloride 104  96 - 112 mEq/L   CO2 23  19 - 32 mEq/L   Glucose, Bld 182 (*) 70 - 99 mg/dL   BUN 4 (*) 6 - 23 mg/dL   Creatinine, Ser 2.13 (*) 0.50 - 1.10 mg/dL   Calcium 9.4  8.4 - 08.6 mg/dL   GFR calc non Af Amer >90  >90 mL/min  GFR calc Af Amer >90  >90 mL/min   Comment:            The eGFR has been calculated     using the CKD EPI equation.     This calculation has not been     validated in all clinical     situations.     eGFR's persistently     <90 mL/min signify     possible Chronic Kidney Disease.  GLUCOSE, CAPILLARY     Status: Abnormal   Collection Time    03/17/13  6:36 AM      Result Value Range   Glucose-Capillary 161 (*) 70 - 99 mg/dL   Comment 1 Notify RN     Comment 2 Documented in Chart      Studies/Results: No results found.  Medications:  I have reviewed the patient's current medications. Scheduled: . antiseptic oral rinse  15 mL Mouth Rinse BID  . aspirin  325 mg Oral Daily  . atorvastatin  20 mg Oral q1800  . carvedilol  6.25 mg Oral BID WC  . cyanocobalamin  1,000 mcg Subcutaneous QPC supper  . heparin  5,000 Units Subcutaneous Q8H  . insulin aspart  0-15 Units Subcutaneous TID WC  . insulin glargine  20 Units Subcutaneous QHS  . isosorbide mononitrate  30 mg Oral BID  . levETIRAcetam  1,000 mg Intravenous Q8H  . losartan  100 mg Oral Daily  . omega-3 acid ethyl esters  1 g Oral BID  . phenytoin (DILANTIN) IV  100 mg Intravenous Q8H  . potassium chloride  10 mEq Oral  BID  . ranolazine  500 mg Oral BID  . sodium chloride  3 mL Intravenous Q12H   PRN:  Assessment/Plan: Focal seizure disorder with breakthrough focal seizures, currently controlled on Dilantin 100 mg 3 times a day and Keppra 1000 mg 3 times a day. Patient does not appear to be having significant side effects from these medications. Dilantin level II days ago was normal.  Recommend no changes in patient's current management. No clinical contraindications to returning to rehabilitation setting. I will sign off on her care at this point, but remain available for followup evaluation if indicated.  C.R. Roseanne Reno, MD [facility] [phone]  03/17/2013  9:03 AM

## 2013-03-17 NOTE — Evaluation (Signed)
Physical Therapy Evaluation Patient Details Name: Diane [name] MRN: [id number] DOB: Nov 19, 1955 Today's Date: 03/17/2013 Time: 1131-1150 PT Time Calculation (min): 19 min  PT Assessment / Plan / Recommendation Clinical Impression  57 y/o female adm. from [facility] SNF for seizures. Presents to PT today with below impairments impacting functional independence and safety with mobility. Will benefit physical therapy in the acute setting to maximize safety and decrease BOC at next venue. Agree with return to SNF.     PT Assessment  Patient needs continued PT services    Follow Up Recommendations  SNF    Does the patient have the potential to tolerate intense rehabilitation      Barriers to Discharge Decreased caregiver support      Equipment Recommendations  Rolling walker with 5" wheels    Recommendations for Other Services     Frequency Min 2X/week    Precautions / Restrictions Precautions Precautions: Fall Restrictions Weight Bearing Restrictions: No   Pertinent Vitals/Pain Denies pain      Mobility  Bed Mobility Bed Mobility: Supine to Sit;Sit to Supine Supine to Sit: 5: Supervision Sit to Supine: 6: Modified independent (Device/Increase time) Details for Bed Mobility Assistance: supervision for safety Transfers Transfers: Sit to Stand;Stand to Sit Sit to Stand: 4: Min assist;With upper extremity assist;From bed;From toilet Stand to Sit: 4: Min assist;With upper extremity assist;To toilet;To bed Details for Transfer Assistance: min stability assist  Ambulation/Gait Ambulation/Gait Assistance: 3: Mod assist Ambulation Distance (Feet): 30 Feet Assistive device: 1 person hand held assist Ambulation/Gait Assistance Details: ambulated to and from the toilet with min-modA for stablity and maintainence of balance, patient increased lateral sway and moderate stagger outside of BOS with poor anticipation of LOB or ability to correct, had I not been there she would have fallen  several times, may benefit from RW?? Will try next visit Gait Pattern: Step-through pattern;Wide base of support General Gait Details: staggered gait with sluggish trunk/gait pattern, poor or absent righting reactions Stairs: No         PT Diagnosis: Difficulty walking;Altered mental status;Abnormality of gait  PT Problem List: Decreased balance;Decreased coordination;Decreased activity tolerance;Decreased cognition PT Treatment Interventions: Gait training;Therapeutic activities;Therapeutic exercise;Balance training;Cognitive remediation;Patient/family education;DME instruction;Neuromuscular re-education   PT Goals Acute Rehab PT Goals Pt will go Supine/Side to Sit: with modified independence PT Goal: Supine/Side to Sit - Progress: Goal set today Pt will go Sit to Stand: with modified independence PT Goal: Sit to Stand - Progress: Goal set today Pt will Ambulate: 51 - 150 feet;with least restrictive assistive device;with supervision PT Goal: Ambulate - Progress: Goal set today  Visit Information  Last PT Received On: 03/17/13 Assistance Needed: +1 (+2 for safety, may be +1 with RW)    Subjective Data  Subjective: I am just so sleepy.    Prior Functioning  Home Living Lives With: Alone Type of Home: House Home Access: Level entry Home Layout: One level Additional Comments: patient reports she was from home alone and has not been in rehab although chart reports she is from [facility] Prior Function Comments: per notes she is from [facility] and getting rehab there, unsure of her baseline as patient is unreliable historian Communication Communication: No difficulties    Cognition  Cognition Arousal/Alertness: Awake/alert Behavior During Therapy: WFL for tasks assessed/performed Overall Cognitive Status: Impaired/Different from baseline Area of Impairment: Orientation Orientation Level: Time (thinks she has been here a week) Current Attention Level: Sustained Following Commands: Follows  one step commands consistently;Follows one step commands with increased  time Awareness: Intellectual;Emergent;Anticipatory Problem Solving: Slow processing;Requires verbal cues;Requires tactile cues;Decreased initiation General Comments: delayed processing and reaction time; per notes she has been at [facility] since last admission however patient has no recollection of his and says she has been at the hospital for the past week (although in note with CSW yesterday she was talking about [facility] and reporting she did not want to go back there)    Extremity/Trunk Assessment Right Upper Extremity Assessment RUE ROM/Strength/Tone: Deficits RUE ROM/Strength/Tone Deficits: grossly 4-/5 RUE Coordination: Deficits RUE Coordination Deficits: fumbling to try to tie her gown and difficulty grasping tiolet paper Left Upper Extremity Assessment LUE ROM/Strength/Tone: WFL for tasks assessed LUE Coordination: Deficits LUE Coordination Deficits: similar difficulty grasping toilet paper or trying to tye her gown Right Lower Extremity Assessment RLE ROM/Strength/Tone: [facility] for tasks assessed Left Lower Extremity Assessment LLE ROM/Strength/Tone: [facility] for tasks assessed   Balance Static Standing Balance Static Standing - Balance Support: No upper extremity supported Static Standing - Level of Assistance: 4: Min assist  End of Session PT - End of Session Equipment Utilized During Treatment: Gait belt Activity Tolerance: Patient tolerated treatment well Patient left: in bed;with call bell/phone within reach;with bed alarm set Nurse Communication: Mobility status  GP     [facility] HELEN 03/17/2013, 12:03 PM

## 2013-03-17 NOTE — Care Management Note (Signed)
    Page 1 of 1   03/18/2013     3:37:38 PM   CARE MANAGEMENT NOTE 03/18/2013  Patient:  Diane [name], Diane [name]   Account Number:  [id number]  Date Initiated:  03/17/2013  Documentation initiated by:  Jacquelynn Cree  Subjective/Objective Assessment:   admitted with seizures from [facility]     Action/Plan:   Plan to return to SNF   Anticipated DC Date:  03/19/2013   Anticipated DC Plan:  SKILLED NURSING FACILITY  In-house referral  Clinical Social Worker      DC Planning Services  CM consult      Choice offered to / List presented to:             Status of service:  Completed, signed off Medicare Important Message given?   (If response is "NO", the following Medicare IM given date fields will be blank) Date Medicare IM given:   Date Additional Medicare IM given:    Discharge Disposition:  SKILLED NURSING FACILITY  Per UR Regulation:  Reviewed for med. necessity/level of care/duration of stay  If discussed at Long Length of Stay Meetings, dates discussed:    Comments:  03/18/13 1030  Elmer Bales RN, MSN, CM-  Met with patient regarding billing questions from encounter prior to hospitalization. Pt was referred to the number on the bill for questions.  Information was also explained to family.   03/17/13 1600  Elmer Bales RN, MSN CM- Met with patient and family to assist with questions regarding social security disability renewal forms.  Pt was unsure who she should talk to about a question asking whether she had discussed the need for continued disability with her physician.  Pt was referred to her primary physician, who has been following with her since her prior renewal in 2012.

## 2013-03-17 NOTE — Clinical Social Work Note (Signed)
Clinical Social Worker continuing to follow for support and discharge planning needs.  Patient plans to return to [facility] 06/18.  CSW has confirmed patient plans with facility and MD.  CSW available for support and to facilitate patient discharge needs once medically ready.  Macario Golds, [place] [phone]

## 2013-03-18 DIAGNOSIS — D649 Anemia, unspecified: Secondary | ICD-10-CM | POA: Diagnosis not present

## 2013-03-18 DIAGNOSIS — I69998 Other sequelae following unspecified cerebrovascular disease: Secondary | ICD-10-CM | POA: Diagnosis not present

## 2013-03-18 DIAGNOSIS — I2583 Coronary atherosclerosis due to lipid rich plaque: Secondary | ICD-10-CM | POA: Diagnosis not present

## 2013-03-18 DIAGNOSIS — E119 Type 2 diabetes mellitus without complications: Secondary | ICD-10-CM | POA: Diagnosis not present

## 2013-03-18 DIAGNOSIS — M6281 Muscle weakness (generalized): Secondary | ICD-10-CM | POA: Diagnosis not present

## 2013-03-18 DIAGNOSIS — R488 Other symbolic dysfunctions: Secondary | ICD-10-CM | POA: Diagnosis not present

## 2013-03-18 DIAGNOSIS — I2510 Atherosclerotic heart disease of native coronary artery without angina pectoris: Secondary | ICD-10-CM | POA: Diagnosis not present

## 2013-03-18 DIAGNOSIS — IMO0001 Reserved for inherently not codable concepts without codable children: Secondary | ICD-10-CM | POA: Diagnosis not present

## 2013-03-18 DIAGNOSIS — I6350 Cerebral infarction due to unspecified occlusion or stenosis of unspecified cerebral artery: Secondary | ICD-10-CM | POA: Diagnosis not present

## 2013-03-18 DIAGNOSIS — G4733 Obstructive sleep apnea (adult) (pediatric): Secondary | ICD-10-CM | POA: Diagnosis not present

## 2013-03-18 DIAGNOSIS — I10 Essential (primary) hypertension: Secondary | ICD-10-CM | POA: Diagnosis not present

## 2013-03-18 DIAGNOSIS — G40901 Epilepsy, unspecified, not intractable, with status epilepticus: Secondary | ICD-10-CM

## 2013-03-18 DIAGNOSIS — R262 Difficulty in walking, not elsewhere classified: Secondary | ICD-10-CM | POA: Diagnosis not present

## 2013-03-18 DIAGNOSIS — R1310 Dysphagia, unspecified: Secondary | ICD-10-CM | POA: Diagnosis not present

## 2013-03-18 DIAGNOSIS — G43701 Chronic migraine without aura, not intractable, with status migrainosus: Secondary | ICD-10-CM | POA: Diagnosis not present

## 2013-03-18 DIAGNOSIS — R569 Unspecified convulsions: Secondary | ICD-10-CM | POA: Diagnosis not present

## 2013-03-18 DIAGNOSIS — F17200 Nicotine dependence, unspecified, uncomplicated: Secondary | ICD-10-CM | POA: Diagnosis not present

## 2013-03-18 DIAGNOSIS — I69928 Other speech and language deficits following unspecified cerebrovascular disease: Secondary | ICD-10-CM | POA: Diagnosis not present

## 2013-03-18 DIAGNOSIS — I6789 Other cerebrovascular disease: Secondary | ICD-10-CM | POA: Diagnosis not present

## 2013-03-18 LAB — GLUCOSE, CAPILLARY
Glucose-Capillary: 134 mg/dL — ABNORMAL HIGH (ref 70–99)
Glucose-Capillary: 195 mg/dL — ABNORMAL HIGH (ref 70–99)
Glucose-Capillary: 146 mg/dL — ABNORMAL HIGH (ref 70–99)

## 2013-03-18 LAB — PHENYTOIN LEVEL, TOTAL: Phenytoin Lvl: 11 ug/mL (ref 10.0–20.0)

## 2013-03-18 NOTE — Clinical Social Work Note (Signed)
Clinical Social Worker facilitated patient discharge including contacting patient family and facility to confirm patient discharge plans.  Clinical information faxed to facility and family agreeable with plan.  CSW arranged transport via family to [facility] .  RN to call report prior to discharge.  Clinical Social Worker will sign off for now as social work intervention is no longer needed. Please consult us again if new need arises.  Jesse Johnnette Laux, LCSW [phone]  

## 2013-03-18 NOTE — Discharge Summary (Signed)
Physician Discharge Summary  JARI CAROLLO ZOX:[id number] DOB: [birth year] DOA: 03/14/2013  PCP: [name], MD  Admit date: 03/14/2013 Discharge date: 03/18/2013  Time spent:35 minutes  Recommendations for Outpatient Follow-up:  1. F/ U with PCP in (7) days  Discharge Diagnoses:  Principal Problem:   Partial seizures Active Problems:   DIABETES MELLITUS, TYPE II   OBSTRUCTIVE SLEEP APNEA   MIGRAINE HEADACHE   HYPERTENSION   CVA   FIBROMYALGIA   Other dysphagia   Discharge Condition:stable  Diet recommendation: DM diet   Filed Weights   03/14/13 1547 03/16/13 0500  Weight: 67.3 kg (148 lb 5.9 oz) 68 kg (149 lb 14.6 oz)    History of present illness:  57 y.o WF recently d/c to [facility]. Family was told that patient started to have seizure epiodes about 7-8 min apart. She had been getting up but falling more at [facility] per family. Work-up last hospital stay (6/3 - 6/6) included MRI (no acute findings per Neuro) and EEG showing only one episode of possible seizure activity despite continuous EEG monitoring in setting of multiple "seizure" episodes. Because of suspected pseudoseizures Psych was also consulted during that admission, but did not recommend inpt Psych hospitalization. She was d/c on Keppra 750 bid with plan to follow closely with Neurology. Pt's    Hospital Course:  In the ED she was loaded with Keppra 1000 mg and was given 1mg  of ativan and 5 mg versed as well. She had 2 episodes lasitng about 30 seconds of gen shaking of upper extremities, with a fixed stare and shaking during the admitting doctor's exam. Pt's Neuro SSx cleared w/i 24 hrs after medication provided. On  03/14/13 Head CT showed Stable encephalomalacia in the right parietal region.  No acute cortical infarction. Neurology was consulted during stay (Dr Roseanne Reno) who D/Ced her fm his service on 03/17/13 on Dilantin 100 mg 3 times a day and Keppra 1000 mg 3 times a day. AM Neuro exam was  WNL. Pt's Hypokalemia has resolved. Pt's Anemia appears to be Chronic in nature and is stable.  Pt's DM has been controlled while hospitalized, however her HgA1C shows Pt has poor home control.      Procedures: None Consultations:  Neurology (Dr Roseanne Reno); signed of 03/17/13  Discharge Exam: Filed Vitals:   03/17/13 1346 03/17/13 1818 03/17/13 2244 03/18/13 0504  BP: 125/71 147/78 148/70 153/81  Pulse: 77 79 75 78  Temp:  97.5 F (36.4 C) 97.6 F (36.4 C) 97.6 F (36.4 C)  TempSrc:  Axillary Oral Oral  Resp: 18 20 20 20   Height:      Weight:      SpO2: 97% 96% 99% 99%    General: Awake, Alert/ Oriented x4  Cardiovascular: RRR, (-) M/R/G,  Respiratory: CTA Bilat Discharge Instructions      Discharge Orders   Future Appointments Provider Department Dept Phone   06/09/2013 12:00 PM [facility] [phone]   06/16/2013 10:30 AM Judy Pimple, MD [facility] [place] [phone]   Future Orders Complete By Expires     Diet - low sodium heart healthy  As directed     Increase activity slowly  As directed         Medication List    TAKE these medications       albuterol (2.5 MG/3ML) 0.083% nebulizer solution  Commonly known as:  PROVENTIL  Take 3 mLs (2.5 mg total) by nebulization every 6 (six)  hours as needed.     aspirin 325 MG tablet  Take 1 tablet (325 mg total) by mouth daily.     carvedilol 12.5 MG tablet  Commonly known as:  COREG  Take 12.5 mg by mouth 2 (two) times daily with a meal.     cetirizine 10 MG tablet  Commonly known as:  ZYRTEC  Take 1 tablet (10 mg total) by mouth daily.     [employer] NATURAL FISH OIL 1200 MG Caps  Take 1 capsule by mouth 2 (two) times daily.     dexlansoprazole 60 MG capsule  Commonly known as:  DEXILANT  Take 1 capsule (60 mg total) by mouth daily.     docusate sodium 100 MG capsule  Commonly known as:  COLACE  Take 1 capsule (100 mg total) by mouth 2 (two) times daily.      FLUoxetine 10 MG capsule  Commonly known as:  PROZAC  Take 10 mg by mouth daily.     guaiFENesin 600 MG 12 hr tablet  Commonly known as:  MUCINEX  Take 2 tablets (1,200 mg total) by mouth 2 (two) times daily.     hydrochlorothiazide 50 MG tablet  Commonly known as:  HYDRODIURIL  Take 50 mg by mouth daily.     insulin aspart 100 UNIT/ML injection  Commonly known as:  novoLOG  Inject into the skin 3 (three) times daily with meals. Sliding scale     insulin glargine 100 UNIT/ML injection  Commonly known as:  LANTUS  Inject 20 Units into the skin at bedtime.     isosorbide mononitrate 30 MG 24 hr tablet  Commonly known as:  IMDUR  Take 30 mg by mouth 2 (two) times daily.     levETIRAcetam 750 MG tablet  Commonly known as:  KEPPRA  Take 1 tablet (750 mg total) by mouth 2 (two) times daily.     LORazepam 2 MG/ML concentrated solution  Commonly known as:  ATIVAN  Take 1 mg by mouth every 6 (six) hours as needed (for seizures).     LORazepam 0.5 MG tablet  Commonly known as:  ATIVAN  Take 0.25 mg by mouth every 6 (six) hours as needed for anxiety.     losartan 100 MG tablet  Commonly known as:  COZAAR  Take 1 tablet (100 mg total) by mouth daily.     nitroGLYCERIN 0.4 MG SL tablet  Commonly known as:  NITROSTAT  Place 1 tablet (0.4 mg total) under the tongue every 5 (five) minutes as needed.     potassium chloride 10 MEQ tablet  Commonly known as:  K-DUR  Take 10 mEq by mouth 2 (two) times daily.     ranolazine 500 MG 12 hr tablet  Commonly known as:  RANEXA  Take 500 mg by mouth 2 (two) times daily.     rosuvastatin 10 MG tablet  Commonly known as:  CRESTOR  Take 10 mg by mouth daily.       Allergies  Allergen Reactions  . Ace Inhibitors     REACTION: cough  . Tetracycline     REACTION: reaction not known   Follow-up Information   Follow up with [name], MD. Schedule an appointment as soon as possible for a visit in 1 week.   Contact information:   [name] [place] [address] [place] [place] [address] [phone]        The results of significant diagnostics from this hospitalization (including imaging,  microbiology, ancillary and laboratory) are listed below for reference.    Significant Diagnostic Studies: Ct Head Wo Contrast  03/14/2013   *RADIOLOGY REPORT*  Clinical Data: Altered mental status, seizure  CT HEAD WITHOUT CONTRAST  Technique:  Contiguous axial images were obtained from the base of the skull through the vertex without contrast.  Comparison: 03/02/2013  Findings: No skull fracture is noted.  Paranasal sinuses and mastoid air cells are unremarkable.  No intracranial hemorrhage, mass effect or midline shift.  Stable encephalomalacia in the right parietal region.  No acute cortical infarction.  No mass lesion is noted on this unenhanced scan.  IMPRESSION: No acute intracranial abnormality.  Stable encephalomalacia in the right parietal region.   Original Report Authenticated By: Natasha Mead, M.D.   Ct Head (brain) Wo Contrast  03/03/2013   *RADIOLOGY REPORT*  Clinical Data: Slurred speech.  Right-sided facial droop.  CT HEAD WITHOUT CONTRAST  Technique:  Contiguous axial images were obtained from the base of the skull through the vertex without contrast.  Comparison: Head CT 11/02/2010.  Findings: Well defined area of decreased attenuation in the right frontoparietal region is unchanged, compatible with encephalomalacia.  No acute intracranial abnormality. Specifically, no definite signs of acute/subacute cerebral ischemia, no evidence of acute intracranial hemorrhage, no mass, mass effect or hydrocephalous.  Mild decreased attenuation in the periventricular white matter of the cerebral hemispheres bilaterally, compatible with mild chronic microvascular ischemic disease.  No acute displaced skull fractures are identified. Visualized paranasal sinuses and mastoids are well pneumatized.  IMPRESSION: 1.  No acute intracranial  abnormalities. 2.  Encephalomalacia in the right frontoparietal region related to prior infarction. 3.  Mild chronic microvascular ischemic disease of the periventricular white matter of the cerebral hemispheres bilaterally.   Original Report Authenticated By: Trudie Reed, M.D.   Mr Brain Wo Contrast  03/03/2013   *RADIOLOGY REPORT*  Clinical Data: Staring spells (question seizures).  Hypertensive hyperlipidemic diabetic patient.  MRI HEAD WITHOUT CONTRAST  Technique:  Multiplanar, multiecho pulse sequences of the brain and surrounding structures were obtained according to standard protocol without intravenous contrast.  Comparison: 03/02/2013 and 07/26/2010 CT.  No comparison MR.  Findings: Motion degraded exam.  Tiny acute non hemorrhagic posterior left frontal lobe infarct.  Moderate size area of encephalomalacia in the right parietal/periatrial region unchanged from prior CTs and most likely related to result of prior infarct.  No intracranial hemorrhage.  Mild small vessel disease type changes.  In the right hippocampal region, tiny cysts are noted probably incidental finding rather than primary mass or evidence of mesial temporal sclerosis.  Global atrophy without hydrocephalus.  Major intracranial vascular structures are patent. Ectatic basilar artery impresses the undersurface of the hypothalamic region.  Cervical medullary junction, pituitary region, pineal region and orbital structures unremarkable.  IMPRESSION:  Tiny acute non hemorrhagic posterior left frontal lobe infarct.  Please see above.  This has been made a PRA call report utilizing dashboard call feature.   Original Report Authenticated By: Lacy Duverney, M.D.    Microbiology: Recent Results (from the past 240 hour(s))  MRSA PCR SCREENING     Status: None   Collection Time    03/14/13  4:07 PM      Result Value Range Status   MRSA by PCR NEGATIVE  NEGATIVE Final   Comment:            The GeneXpert MRSA Assay (FDA     approved for  NASAL specimens     only), is one component  of a     comprehensive MRSA colonization     surveillance program. It is not     intended to diagnose MRSA     infection nor to guide or     monitor treatment for     MRSA infections.     Labs: Basic Metabolic Panel:  Recent Labs Lab 03/14/13 1000 03/14/13 1538 03/15/13 0540 03/16/13 0435 03/17/13 0458  NA 140  --  142 139 137  K 2.9*  --  3.3* 3.4* 3.8  CL 104  --  110 106 104  CO2 23  --  21 22 23   GLUCOSE 161*  --  94 135* 182*  BUN 28*  --  18 7 4*  CREATININE 0.65 0.50 0.44* 0.38* 0.41*  CALCIUM 8.8  --  9.0 9.0 9.4  MG 1.5  --   --  1.7  --   PHOS 3.5  --   --   --   --    Liver Function Tests:  Recent Labs Lab 03/14/13 1000 03/15/13 0540  AST 13 18  ALT 14 14  ALKPHOS 73 70  BILITOT 0.3 0.2*  PROT 6.0 5.7*  ALBUMIN 2.8* 2.8*   No results found for this basename: LIPASE, AMYLASE,  in the last 168 hours  Recent Labs Lab 03/14/13 1538  AMMONIA 36   CBC:  Recent Labs Lab 03/14/13 1000 03/14/13 1538 03/15/13 0540  WBC 8.0 11.0* 6.4  HGB 11.1* 10.9* 10.9*  HCT 35.7* 35.6* 35.8*  MCV 71.3* 71.9* 72.8*  PLT 228 210 203   Cardiac Enzymes: No results found for this basename: CKTOTAL, CKMB, CKMBINDEX, TROPONINI,  in the last 168 hours BNP: BNP (last 3 results) No results found for this basename: PROBNP,  in the last 8760 hours CBG:  Recent Labs Lab 03/17/13 0636 03/17/13 1123 03/17/13 1626 03/17/13 2246 03/18/13 0702  GLUCAP 161* 200* 246* 217* 134*       Signed:  Mathhew Buysse, J  [facility] 03/18/2013, 12:59 PM

## 2013-03-18 NOTE — Progress Notes (Signed)
Pt will be discharged this evening back to [facility]. Daughter will come around 7pm to transport pt to [facility]. Report called to [employer], Allayne Gitelman, RN.

## 2013-03-19 DIAGNOSIS — R569 Unspecified convulsions: Secondary | ICD-10-CM | POA: Diagnosis not present

## 2013-03-19 DIAGNOSIS — D649 Anemia, unspecified: Secondary | ICD-10-CM | POA: Diagnosis not present

## 2013-03-19 DIAGNOSIS — G4733 Obstructive sleep apnea (adult) (pediatric): Secondary | ICD-10-CM | POA: Diagnosis not present

## 2013-03-19 DIAGNOSIS — E119 Type 2 diabetes mellitus without complications: Secondary | ICD-10-CM | POA: Diagnosis not present

## 2013-03-25 ENCOUNTER — Encounter: Payer: Self-pay | Admitting: Family Medicine

## 2013-03-25 ENCOUNTER — Ambulatory Visit (INDEPENDENT_AMBULATORY_CARE_PROVIDER_SITE_OTHER): Payer: Medicare Other | Admitting: Family Medicine

## 2013-03-25 VITALS — BP 140/86 | HR 102 | Temp 98.3°F | Wt 144.8 lb

## 2013-03-25 DIAGNOSIS — E119 Type 2 diabetes mellitus without complications: Secondary | ICD-10-CM

## 2013-03-25 DIAGNOSIS — F17200 Nicotine dependence, unspecified, uncomplicated: Secondary | ICD-10-CM

## 2013-03-25 DIAGNOSIS — R569 Unspecified convulsions: Secondary | ICD-10-CM | POA: Diagnosis not present

## 2013-03-25 DIAGNOSIS — I10 Essential (primary) hypertension: Secondary | ICD-10-CM

## 2013-03-25 DIAGNOSIS — I779 Disorder of arteries and arterioles, unspecified: Secondary | ICD-10-CM

## 2013-03-25 NOTE — Progress Notes (Signed)
Subjective:    Patient ID: Diane [name], female    DOB: 11-05-1955, 57 y.o.   MRN: [id number]  HPI CC: hosp f/u  [name] presents today as hospital follow up - currently at [facility].  Presents with friend Thomasene Lot who she will be going home with upon SNF discharge.  Recent recurrent hospitalization - first with small lacunar infarct early June (actually thought artifact by neurology at that time), then with recurrent seizures 6/14-18/2014 - ?of pseudoseizures s/p psych eval. Discharged on keppra 750mg  bid and on dilantin 100mg  tid.  Wants to leave SNF.  Using wheelchair at [facility] but thinks stable to walk.  Working with PT, who says she's doing very well.  H/o DM - on lantus 20u daily.  On novolog sliding scale as well.  States sugars well controlled at rehab, but doesn't know numbers.   Lab Results  Component Value Date   HGBA1C 12.8* 03/03/2013   H/o COPD, continued smoker.  Down to 1/2 ppd.  Asks about e -cig  Asks about tramadol for pain.  bp running elevated at rehab and mildly elevated today..  Admit date: 03/14/2013  Discharge date: 03/18/2013   Recommendations for Outpatient Follow-up:  1. F/ U with PCP in (7) days Discharge Diagnoses:  Principal Problem:  Partial seizures  Active Problems:  DIABETES MELLITUS, TYPE II  OBSTRUCTIVE SLEEP APNEA  MIGRAINE HEADACHE  HYPERTENSION  CVA  FIBROMYALGIA  Other dysphagia  Discharge Condition:stable  Diet recommendation: DM diet  Filed Weights    03/14/13 1547  03/16/13 0500   Weight:  67.3 kg (148 lb 5.9 oz)  68 kg (149 lb 14.6 oz)   History of present illness:  57 y.o WF recently d/c to [facility]. Family was told that patient started to have seizure epiodes about 7-8 min apart. She had been getting up but falling more at [facility] per family. Work-up last hospital stay (6/3 - 6/6) included MRI (no acute findings per Neuro) and EEG showing only one episode of possible seizure activity despite  continuous EEG monitoring in setting of multiple "seizure" episodes. Because of suspected pseudoseizures Psych was also consulted during that admission, but did not recommend inpt Psych hospitalization. She was d/c on Keppra 750 bid with plan to follow closely with Neurology. Pt's  Hospital Course:  In the ED she was loaded with Keppra 1000 mg and was given 1mg  of ativan and 5 mg versed as well. She had 2 episodes lasitng about 30 seconds of gen shaking of upper extremities, with a fixed stare and shaking during the admitting doctor's exam. Pt's Neuro SSx cleared w/i 24 hrs after medication provided. On 03/14/13 Head CT showed Stable encephalomalacia in the right parietal region. No acute cortical infarction. Neurology was consulted during stay (Dr Roseanne Reno) who D/Ced her fm his service on 03/17/13 on Dilantin 100 mg 3 times a day and Keppra 1000 mg 3 times a day. AM Neuro exam was WNL. Pt's Hypokalemia has resolved. Pt's Anemia appears to be Chronic in nature and is stable. Pt's DM has been controlled while hospitalized, however her HgA1C shows Pt has poor home control.  Procedures:  None  Consultations:  Neurology (Dr Roseanne Reno); signed of 03/17/13 Discharge Instructions      Discharge Orders    Future Appointments  Provider  Department  Dept Phone    06/09/2013 12:00 PM  [facility]  [facility] [place]  [phone]    06/16/2013 10:30 AM  Judy Pimple, MD  [facility] conservation officer at [place]  [phone]    Future Orders  Complete By  Expires     Diet - low sodium heart healthy  As directed      Increase activity slowly  As directed          Medication List     TAKE these medications       albuterol (2.5 MG/3ML) 0.083% nebulizer solution    Commonly known as: PROVENTIL    Take 3 mLs (2.5 mg total) by nebulization every 6 (six) hours as needed.    aspirin 325 MG tablet    Take 1 tablet (325 mg total) by mouth daily.    carvedilol 12.5 MG tablet    Commonly known as: COREG     Take 12.5 mg by mouth 2 (two) times daily with a meal.    cetirizine 10 MG tablet    Commonly known as: ZYRTEC    Take 1 tablet (10 mg total) by mouth daily.    [employer] NATURAL FISH OIL 1200 MG Caps    Take 1 capsule by mouth 2 (two) times daily.    dexlansoprazole 60 MG capsule    Commonly known as: DEXILANT    Take 1 capsule (60 mg total) by mouth daily.    docusate sodium 100 MG capsule    Commonly known as: COLACE    Take 1 capsule (100 mg total) by mouth 2 (two) times daily.    FLUoxetine 10 MG capsule    Commonly known as: PROZAC    Take 10 mg by mouth daily.    guaiFENesin 600 MG 12 hr tablet    Commonly known as: MUCINEX    Take 2 tablets (1,200 mg total) by mouth 2 (two) times daily.    hydrochlorothiazide 50 MG tablet    Commonly known as: HYDRODIURIL    Take 50 mg by mouth daily.    insulin aspart 100 UNIT/ML injection    Commonly known as: novoLOG    Inject into the skin 3 (three) times daily with meals. Sliding scale    insulin glargine 100 UNIT/ML injection    Commonly known as: LANTUS    Inject 20 Units into the skin at bedtime.    isosorbide mononitrate 30 MG 24 hr tablet    Commonly known as: IMDUR    Take 30 mg by mouth 2 (two) times daily.    levETIRAcetam 750 MG tablet    Commonly known as: KEPPRA    Take 1 tablet (750 mg total) by mouth 2 (two) times daily.    LORazepam 2 MG/ML concentrated solution    Commonly known as: ATIVAN    Take 1 mg by mouth every 6 (six) hours as needed (for seizures).    LORazepam 0.5 MG tablet    Commonly known as: ATIVAN    Take 0.25 mg by mouth every 6 (six) hours as needed for anxiety.    losartan 100 MG tablet    Commonly known as: COZAAR    Take 1 tablet (100 mg total) by mouth daily.    nitroGLYCERIN 0.4 MG SL tablet    Commonly known as: NITROSTAT    Place 1 tablet (0.4 mg total) under the tongue every 5 (five) minutes as needed.    potassium chloride 10 MEQ tablet    Commonly known as: K-DUR    Take 10 mEq by mouth  2 (two) times daily.    ranolazine 500 MG 12 hr tablet    Commonly  known as: RANEXA    Take 500 mg by mouth 2 (two) times daily.    rosuvastatin 10 MG tablet    Commonly known as: CRESTOR    Take 10 mg by mouth daily.      Follow-up Information    Follow up with [name], MD. Schedule an appointment as soon as possible for a visit in 1 week.    Contact information:    [address] [place]  [address] [place] [address]  [phone]      The results of significant diagnostics from this hospitalization (including imaging, microbiology, ancillary and laboratory) are listed below for reference.   Significant Diagnostic Studies:  Ct Head Wo Contrast  03/14/2013 *RADIOLOGY REPORT* Clinical Data: Altered mental status, seizure CT HEAD WITHOUT CONTRAST Technique: Contiguous axial images were obtained from the base of the skull through the vertex without contrast. Comparison: 03/02/2013 Findings: No skull fracture is noted. Paranasal sinuses and mastoid air cells are unremarkable. No intracranial hemorrhage, mass effect or midline shift. Stable encephalomalacia in the right parietal region. No acute cortical infarction. No mass lesion is noted on this unenhanced scan. IMPRESSION: No acute intracranial abnormality. Stable encephalomalacia in the right parietal region. Original Report Authenticated By: Natasha Mead, M.D.  Ct Head (brain) Wo Contrast  03/03/2013 *RADIOLOGY REPORT* Clinical Data: Slurred speech. Right-sided facial droop. CT HEAD WITHOUT CONTRAST Technique: Contiguous axial images were obtained from the base of the skull through the vertex without contrast. Comparison: Head CT 11/02/2010. Findings: Well defined area of decreased attenuation in the right frontoparietal region is unchanged, compatible with encephalomalacia. No acute intracranial abnormality. Specifically, no definite signs of acute/subacute cerebral ischemia, no evidence of acute intracranial hemorrhage, no  mass, mass effect or hydrocephalous. Mild decreased attenuation in the periventricular white matter of the cerebral hemispheres bilaterally, compatible with mild chronic microvascular ischemic disease. No acute displaced skull fractures are identified. Visualized paranasal sinuses and mastoids are well pneumatized. IMPRESSION: 1. No acute intracranial abnormalities. 2. Encephalomalacia in the right frontoparietal region related to prior infarction. 3. Mild chronic microvascular ischemic disease of the periventricular white matter of the cerebral hemispheres bilaterally. Original Report Authenticated By: Trudie Reed, M.D.  Mr Brain Wo Contrast  03/03/2013 *RADIOLOGY REPORT* Clinical Data: Staring spells (question seizures). Hypertensive hyperlipidemic diabetic patient. MRI HEAD WITHOUT CONTRAST Technique: Multiplanar, multiecho pulse sequences of the brain and surrounding structures were obtained according to standard protocol without intravenous contrast. Comparison: 03/02/2013 and 07/26/2010 CT. No comparison MR. Findings: Motion degraded exam. Tiny acute non hemorrhagic posterior left frontal lobe infarct. Moderate size area of encephalomalacia in the right parietal/periatrial region unchanged from prior CTs and most likely related to result of prior infarct. No intracranial hemorrhage. Mild small vessel disease type changes. In the right hippocampal region, tiny cysts are noted probably incidental finding rather than primary mass or evidence of mesial temporal sclerosis. Global atrophy without hydrocephalus. Major intracranial vascular structures are patent. Ectatic basilar artery impresses the undersurface of the hypothalamic region. Cervical medullary junction, pituitary region, pineal region and orbital structures unremarkable. IMPRESSION: Tiny acute non hemorrhagic posterior left frontal lobe infarct. Please see above. This has been made a PRA call report utilizing dashboard call feature. Original Report  Authenticated By: Lacy Duverney, M.D.   Medications and allergies reviewed and updated in chart.  Past histories reviewed and updated if relevant as below. Patient Active Problem List   Diagnosis Date Noted  . Goiter 09/01/2012  . Other screening mammogram 06/13/2012  .  Allergic rhinitis 06/13/2012  . Chronic bronchitis 04/02/2012  . Hyponatremia 12/11/2011  . Carotid artery disease 08/20/2011  . Other dysphagia 09/14/2010  . DIZZINESS 07/26/2010  . OBSTRUCTIVE SLEEP APNEA 09/18/2007  . TOBACCO USE 08/08/2007  . DIABETES MELLITUS, TYPE II 03/26/2007  . HYPERLIPIDEMIA 03/26/2007  . ANXIETY 03/26/2007  . DEPRESSION 03/26/2007  . MIGRAINE HEADACHE 03/26/2007  . CARPAL TUNNEL SYNDROME, BILATERAL 03/26/2007  . HYPERTENSION 03/26/2007  . CORONARY ARTERY DISEASE 03/26/2007  . CVA 03/26/2007  . GERD 03/26/2007  . FIBROMYALGIA 03/26/2007   Past Medical History  Diagnosis Date  . Coronary artery disease     post CABG in 3/07   . Hypertension   . Hyperlipidemia   . Dyslipidemia   . Diabetes mellitus     type 2  . Chronic bronchitis   . CVA (cerebral infarction) 1993  . Tobacco abuse   . COPD (chronic obstructive pulmonary disease)   . Stroke 1993  . Seizures    Past Surgical History  Procedure Laterality Date  . Coronary stent placement  08/11/06    PCI of her ciurcumflex/OM vessel  . Cardiac catheterization  11/29/05    EF of 55%  . Cardiac catheterization  08/06/06    EF of 45-50%  . Coronary artery bypass graft  12/04/2005    x5 -- left internal mammary artery to the LAD, left radial artery to the ramus intermedius, saphenous vein graft to the obtuse marginal 1, sequential saphenous vein grat to the acute marginal and posterior descending, endoscopic vein harvesting from the left thigh with open vein harvest from right leg   History  Substance Use Topics  . Smoking status: Current Some Day Smoker -- 1.00 packs/day for 36 years    Types: Cigarettes  . Smokeless tobacco:  Never Used     Comment: 1.5-2 ppd  . Alcohol Use: 0.5 oz/week    1 drink(s) per week     Comment: occ   Family History  Problem Relation Age of Onset  . Coronary artery disease    . Heart disease    . Heart disease Mother   . Diabetes Mother   . COPD Mother   . Hyperlipidemia Mother   . Hypertension Mother   . Cancer Father   . Drug abuse Paternal Grandmother   . Stroke Paternal Grandfather    Allergies  Allergen Reactions  . Ace Inhibitors     REACTION: cough  . Tetracycline     REACTION: reaction not known   Current Outpatient Prescriptions on File Prior to Visit  Medication Sig Dispense Refill  . albuterol (PROVENTIL) (2.5 MG/3ML) 0.083% nebulizer solution Take 3 mLs (2.5 mg total) by nebulization every 6 (six) hours as needed.  360 mL  3  . aspirin 325 MG tablet Take 1 tablet (325 mg total) by mouth daily.  90 tablet  0  . carvedilol (COREG) 12.5 MG tablet Take 12.5 mg by mouth 2 (two) times daily with a meal.      . cetirizine (ZYRTEC) 10 MG tablet Take 1 tablet (10 mg total) by mouth daily.  90 tablet  0  . dexlansoprazole (DEXILANT) 60 MG capsule Take 1 capsule (60 mg total) by mouth daily.  30 capsule  11  . docusate sodium (COLACE) 100 MG capsule Take 1 capsule (100 mg total) by mouth 2 (two) times daily.  180 capsule  0  . FLUoxetine (PROZAC) 10 MG capsule Take 10 mg by mouth daily.      [name]  guaiFENesin (MUCINEX) 600 MG 12 hr tablet Take 2 tablets (1,200 mg total) by mouth 2 (two) times daily.  360 tablet  0  . hydrochlorothiazide (HYDRODIURIL) 50 MG tablet Take 50 mg by mouth daily.      . insulin aspart (NOVOLOG) 100 UNIT/ML injection Inject into the skin 3 (three) times daily with meals. Sliding scale      . insulin glargine (LANTUS) 100 UNIT/ML injection Inject 20 Units into the skin at bedtime.      . isosorbide mononitrate (IMDUR) 30 MG 24 hr tablet Take 30 mg by mouth 2 (two) times daily.      [name] levETIRAcetam (KEPPRA) 750 MG tablet Take 1 tablet (750 mg total)  by mouth 2 (two) times daily.      [name] LORazepam (ATIVAN) 0.5 MG tablet Take 0.25 mg by mouth every 6 (six) hours as needed for anxiety.      [name] LORazepam (ATIVAN) 2 MG/ML concentrated solution Take 1 mg by mouth every 6 (six) hours as needed (for seizures).      . losartan (COZAAR) 100 MG tablet Take 1 tablet (100 mg total) by mouth daily.  90 tablet  3  . nitroGLYCERIN (NITROSTAT) 0.4 MG SL tablet Place 1 tablet (0.4 mg total) under the tongue every 5 (five) minutes as needed.  25 tablet  11  . Omega-3 Fatty Acids ([employer] NATURAL FISH OIL) 1200 MG CAPS Take 1 capsule by mouth 2 (two) times daily.  180 capsule  0  . potassium chloride (K-DUR) 10 MEQ tablet Take 10 mEq by mouth 2 (two) times daily.      . ranolazine (RANEXA) 500 MG 12 hr tablet Take 500 mg by mouth 2 (two) times daily.      . rosuvastatin (CRESTOR) 10 MG tablet Take 10 mg by mouth daily.       No current facility-administered medications on file prior to visit.     Review of Systems Per HPI    Objective:   Physical Exam  Nursing note and vitals reviewed. Constitutional: She appears well-developed and well-nourished. No distress.  HENT:  Head: Normocephalic and atraumatic.  Mouth/Throat: Oropharynx is clear and moist. No oropharyngeal exudate.  Eyes: Conjunctivae and EOM are normal. Pupils are equal, round, and reactive to light. No scleral icterus.  Neck: Normal range of motion. Neck supple. Carotid bruit is present (L bruit, known carotid stenosis).  Cardiovascular: Normal rate, regular rhythm and intact distal pulses.   Murmur (2/6 SEM best at RUSB) heard. Pulmonary/Chest: Effort normal and breath sounds normal. No respiratory distress. She has no wheezes. She has no rales.  Musculoskeletal: She exhibits no edema.  Psychiatric: She has a normal mood and affect. Her behavior is normal. Judgment and thought content normal.       Assessment & Plan:

## 2013-03-25 NOTE — Assessment & Plan Note (Signed)
Rpt [place] due 05/2013.

## 2013-03-25 NOTE — Assessment & Plan Note (Signed)
Discussed importance of good sugar control. Advised to pay attention to insulin she's receiving at rehab as well as lantus (did not know amt she is currently on) as she will have to take responsibility for DM control once discharged.

## 2013-03-25 NOTE — Assessment & Plan Note (Signed)
E-cigs have been helping, however currently on hold 2/2 ?of seizures. Down to 1/2 ppd. Motivated to quit.

## 2013-03-25 NOTE — Assessment & Plan Note (Signed)
Mildly elevated today.  No changes to med regimen done today.

## 2013-03-25 NOTE — Patient Instructions (Addendum)
Call neurologists' office for follow up appointment - [facility]. Continue working hard for goal of discharge to [facility]. Good to see you today.  Return to see Dr. Milinda Antis in 2-3 months for follow up

## 2013-03-25 NOTE — Assessment & Plan Note (Signed)
?  of pseudoseizure while in hospital.  CT head stable.  EEG unrevealing - I can't find actual results of EEG in Epic. Latest note thought to be focal seizure disorder with breakthrough focal seizures. I suggested she call and schedule neurology follow up to monitor and manage AEDs. Discussed avoiding tramadol for now 2/2 lowering seizure threshold. F/u with PCP in 2-3 months.

## 2013-04-15 DIAGNOSIS — M6281 Muscle weakness (generalized): Secondary | ICD-10-CM | POA: Diagnosis not present

## 2013-04-15 DIAGNOSIS — E119 Type 2 diabetes mellitus without complications: Secondary | ICD-10-CM | POA: Diagnosis not present

## 2013-04-15 DIAGNOSIS — I2510 Atherosclerotic heart disease of native coronary artery without angina pectoris: Secondary | ICD-10-CM | POA: Diagnosis not present

## 2013-04-15 DIAGNOSIS — I10 Essential (primary) hypertension: Secondary | ICD-10-CM | POA: Diagnosis not present

## 2013-04-15 DIAGNOSIS — I69998 Other sequelae following unspecified cerebrovascular disease: Secondary | ICD-10-CM | POA: Diagnosis not present

## 2013-04-15 DIAGNOSIS — G40309 Generalized idiopathic epilepsy and epileptic syndromes, not intractable, without status epilepticus: Secondary | ICD-10-CM | POA: Diagnosis not present

## 2013-04-22 DIAGNOSIS — I69998 Other sequelae following unspecified cerebrovascular disease: Secondary | ICD-10-CM | POA: Diagnosis not present

## 2013-04-22 DIAGNOSIS — E119 Type 2 diabetes mellitus without complications: Secondary | ICD-10-CM | POA: Diagnosis not present

## 2013-04-22 DIAGNOSIS — M6281 Muscle weakness (generalized): Secondary | ICD-10-CM | POA: Diagnosis not present

## 2013-04-22 DIAGNOSIS — I10 Essential (primary) hypertension: Secondary | ICD-10-CM | POA: Diagnosis not present

## 2013-04-22 DIAGNOSIS — G40309 Generalized idiopathic epilepsy and epileptic syndromes, not intractable, without status epilepticus: Secondary | ICD-10-CM | POA: Diagnosis not present

## 2013-04-22 DIAGNOSIS — I2510 Atherosclerotic heart disease of native coronary artery without angina pectoris: Secondary | ICD-10-CM | POA: Diagnosis not present

## 2013-04-24 DIAGNOSIS — I10 Essential (primary) hypertension: Secondary | ICD-10-CM | POA: Diagnosis not present

## 2013-04-24 DIAGNOSIS — M6281 Muscle weakness (generalized): Secondary | ICD-10-CM | POA: Diagnosis not present

## 2013-04-24 DIAGNOSIS — G40309 Generalized idiopathic epilepsy and epileptic syndromes, not intractable, without status epilepticus: Secondary | ICD-10-CM | POA: Diagnosis not present

## 2013-04-24 DIAGNOSIS — I2510 Atherosclerotic heart disease of native coronary artery without angina pectoris: Secondary | ICD-10-CM | POA: Diagnosis not present

## 2013-04-24 DIAGNOSIS — E119 Type 2 diabetes mellitus without complications: Secondary | ICD-10-CM | POA: Diagnosis not present

## 2013-04-24 DIAGNOSIS — I69998 Other sequelae following unspecified cerebrovascular disease: Secondary | ICD-10-CM | POA: Diagnosis not present

## 2013-04-28 ENCOUNTER — Telehealth: Payer: Self-pay | Admitting: Neurology

## 2013-04-28 ENCOUNTER — Encounter: Payer: Self-pay | Admitting: Neurology

## 2013-04-28 ENCOUNTER — Ambulatory Visit (INDEPENDENT_AMBULATORY_CARE_PROVIDER_SITE_OTHER): Payer: Medicare Other | Admitting: Neurology

## 2013-04-28 VITALS — BP 99/63 | HR 84 | Ht <= 58 in | Wt 146.0 lb

## 2013-04-28 DIAGNOSIS — R4701 Aphasia: Secondary | ICD-10-CM

## 2013-04-28 DIAGNOSIS — R569 Unspecified convulsions: Secondary | ICD-10-CM | POA: Diagnosis not present

## 2013-04-28 DIAGNOSIS — I6529 Occlusion and stenosis of unspecified carotid artery: Secondary | ICD-10-CM

## 2013-04-28 NOTE — Patient Instructions (Addendum)
Discontinue phenytoin and continue Keppra alone. Check EEG for any seizure activity. Check transcanal Doppler study with laser reactive again emboli monitoring to evaluate her known prior bilateral carotid stenosis. Continue Plavix and possibly consider discontinuing aspirin if okay with her cardiologist Dr. [place]. Outpatient referral to behavioral health psychiatry for transfer of care from [facility] as per patient request. Follow up in the future only as necessary.

## 2013-04-28 NOTE — Progress Notes (Signed)
[facility] [address] [place]. [place] [address] [phone]       OFFICE CONSULT     Diane. Diane [name] Date of Birth:  [birth year] Medical Record Number:  [id number]   Referring MD [name], MD Reason ; seizures HPI:  Diane [name] is a 84 year Caucasian lady who was recently admitted to [facility] on 03/02/13 for recurrent stereo typical episodes of difficulty speaking with babbling speech as well as staring and neck turning to the right as well as some jerking of the arms lasting less than a minute. She had multiple such episodes and had EEG monitoring during several episodes which did not reveal electrographic correlates for seizures. She was initially thought to have seizures and was started on phenytoin and Keppra was added later. She was seen in consultation with Dr. Roseanne Reno and diagnosis of nonepileptic events was subsequently made. Psychiatry was consulted but they did not recommend any inpatient admission. MRI scan of the brain was obtained which showed no acute stroke. Patientstates sh has done well since discharge and she's had no more of these spells and she's tolerating Keppra 1000 milligrams twice daily and Dilantin 100 mg 3 times daily quite well without any side effects. She does have remote history of stroke in 1993 from right carotid artery dissection and has done well from neurovascular standpoint without recurrent symptoms.she saw me in 2012 for dizziness . She did have a CT angiogram in 2012 showed bilateral moderate carotid stenosis. I have reviewed her hospital records but am not able to find detailed EEG reports a carotid Doppler reports that she and. She has a long-standing history of ectatic problems and has been followed in [facility] but states she has not been there lately and would prefer following up with somebody in [place] sooner. She is on aspirin and Plavix and tolerating it well with only minor bruising. She states her  diabetes control is better than what it used to be but fasting sugars have stayed fairly below 200 ROS:   14 system review of systems is positive for fatigue, swelling in the legs, itching or shortness of breath, cough, wheezing, snoring, constipation, easy bruising and bleeding, not, cold, increased thirst, muscle, running nose, skin sensitivity, memory loss, insomnia, snoring, restless leg, seizures, weakness  PMH:  Past Medical History  Diagnosis Date  . Coronary artery disease     post CABG in 3/07   . Hypertension   . Hyperlipidemia   . Dyslipidemia   . Diabetes mellitus     type 2  . Chronic bronchitis   . CVA (cerebral infarction) 1993  . Tobacco abuse   . COPD (chronic obstructive pulmonary disease)   . Stroke 1993  . Seizures   . Carotid stenosis   . Systolic murmur     known mild AS and MR    Social History:  History   Social History  . Marital Status: Divorced    Spouse Name: N/A    Number of Children: N/A  . Years of Education: N/A   Occupational History  . Not on file.   Social History Main Topics  . Smoking status: Current Some Day Smoker -- 1.00 packs/day for 36 years    Types: Cigarettes  . Smokeless tobacco: Never Used     Comment: 1.5-2 ppd  . Alcohol Use: 0.5 oz/week    1 drink(s) per week     Comment: occ  . Drug Use: No  . Sexually  Active: Not Currently -- Female partner(s)    Birth Control/ Protection: None   Other Topics Concern  . Not on file   Social History Narrative  . No narrative on file    Medications:   Current Outpatient Prescriptions on File Prior to Visit  Medication Sig Dispense Refill  . albuterol (PROVENTIL) (2.5 MG/3ML) 0.083% nebulizer solution Take 3 mLs (2.5 mg total) by nebulization every 6 (six) hours as needed.  360 mL  3  . aspirin 325 MG tablet Take 1 tablet (325 mg total) by mouth daily.  90 tablet  0  . carvedilol (COREG) 12.5 MG tablet Take 12.5 mg by mouth 2 (two) times daily with a meal.      . cetirizine  (ZYRTEC) 10 MG tablet Take 1 tablet (10 mg total) by mouth daily.  90 tablet  0  . dexlansoprazole (DEXILANT) 60 MG capsule Take 1 capsule (60 mg total) by mouth daily.  30 capsule  11  . docusate sodium (COLACE) 100 MG capsule Take 1 capsule (100 mg total) by mouth 2 (two) times daily.  180 capsule  0  . FLUoxetine (PROZAC) 10 MG capsule Take 10 mg by mouth daily.      [name] guaiFENesin (MUCINEX) 600 MG 12 hr tablet Take 2 tablets (1,200 mg total) by mouth 2 (two) times daily.  360 tablet  0  . hydrochlorothiazide (HYDRODIURIL) 50 MG tablet Take 50 mg by mouth daily.      . insulin aspart (NOVOLOG) 100 UNIT/ML injection Inject into the skin 3 (three) times daily with meals. Sliding scale      . insulin glargine (LANTUS) 100 UNIT/ML injection Inject 20 Units into the skin at bedtime.      . isosorbide mononitrate (IMDUR) 30 MG 24 hr tablet Take 30 mg by mouth 2 (two) times daily.      [name] levETIRAcetam (KEPPRA) 750 MG tablet Take 1 tablet (750 mg total) by mouth 2 (two) times daily.      [name] LORazepam (ATIVAN) 0.5 MG tablet Take 0.25 mg by mouth every 6 (six) hours as needed for anxiety.      [name] losartan (COZAAR) 100 MG tablet Take 1 tablet (100 mg total) by mouth daily.  90 tablet  3  . nitroGLYCERIN (NITROSTAT) 0.4 MG SL tablet Place 1 tablet (0.4 mg total) under the tongue every 5 (five) minutes as needed.  25 tablet  11  . Omega-3 Fatty Acids ([facility] NATURAL FISH OIL) 1200 MG CAPS Take 1 capsule by mouth 2 (two) times daily.  180 capsule  0  . phenytoin (DILANTIN) 100 MG ER capsule Take 100 mg by mouth 3 (three) times daily.      . potassium chloride (K-DUR) 10 MEQ tablet Take 10 mEq by mouth 2 (two) times daily.      . ranolazine (RANEXA) 500 MG 12 hr tablet Take 500 mg by mouth 2 (two) times daily.      . rosuvastatin (CRESTOR) 10 MG tablet Take 10 mg by mouth daily.       No current facility-administered medications on file prior to visit.    Allergies:   Allergies  Allergen Reactions  . Ace  Inhibitors     REACTION: cough  . Tetracycline     REACTION: reaction not known    Physical Exam General: well developed, well nourished, seated, in no evident distress Head: head normocephalic and atraumatic. Orohparynx benign Neck: supple with no carotid or supraclavicular bruits Cardiovascular: regular rate and rhythm,  no murmurs Musculoskeletal: no deformity Skin:  no rash/petichiae. Scar midline chest anteriorly and midline neck posteriorly from prior surgeries Vascular:  Normal pulses all extremities Filed Vitals:   04/28/13 1300  BP: 99/63  Pulse: 84    Neurologic Exam Mental Status: Awake and fully alert. Oriented to place and time. Recent and remote memory intact. Attention span, concentration and fund of knowledge appropriate. Mood and affect appropriate.  Cranial Nerves: Fundoscopic exam reveals sharp disc margins. Pupils equal, briskly reactive to light. Extraocular movements full without nystagmus. Visual fields full to confrontation. Hearing intact. Facial sensation intact. Face, tongue, palate moves normally and symmetrically.  Motor: Normal bulk and tone. Normal strength in all tested extremity muscles. Sensory.: intact to touch and pinprick and  Diminished vibration below ankles bilaterally..  Coordination: Rapid alternating movements normal in all extremities. Finger-to-nose and heel-to-shin performed accurately bilaterally. Gait and Station: Arises from chair without difficulty. Stance is normal. Gait demonstrates normal stride length and balance . Able to heel, toe and tandem walk without difficulty.  Reflexes: 1+ and symmetric. Toes downgoing.     ASSESSMENT: 70 year lady with hospitalization in June 2014 for multiple episodes of expressive language difficulties with transient Expressive speech difficulties and right-sided head and arm jerking with the negative EEG correlation possible pseudoseizures. Remote history of right parietal infarct from right internal  carotid artery dissection in 1993 with moderate bilateral carotid stenosis found last CT angiogram from February 2012    PLAN: Discontinue phenytoin and continue Keppra alone. Check EEG for any seizure activity. Check transcanal Doppler study with laser reactive again emboli monitoring to evaluate her known prior bilateral carotid stenosis. Continue Plavix and possibly consider discontinuing aspirin if okay with her cardiologist Dr. Swaz[place]. Outpatient referral to behavioral health psychiatry for transfer of care from [facility] as per patient request. Follow up in the future only as necessary.

## 2013-04-29 DIAGNOSIS — E119 Type 2 diabetes mellitus without complications: Secondary | ICD-10-CM | POA: Diagnosis not present

## 2013-04-29 DIAGNOSIS — G40309 Generalized idiopathic epilepsy and epileptic syndromes, not intractable, without status epilepticus: Secondary | ICD-10-CM | POA: Diagnosis not present

## 2013-04-29 DIAGNOSIS — M6281 Muscle weakness (generalized): Secondary | ICD-10-CM | POA: Diagnosis not present

## 2013-04-29 DIAGNOSIS — I2510 Atherosclerotic heart disease of native coronary artery without angina pectoris: Secondary | ICD-10-CM | POA: Diagnosis not present

## 2013-04-29 DIAGNOSIS — I69998 Other sequelae following unspecified cerebrovascular disease: Secondary | ICD-10-CM | POA: Diagnosis not present

## 2013-04-29 DIAGNOSIS — I10 Essential (primary) hypertension: Secondary | ICD-10-CM | POA: Diagnosis not present

## 2013-04-29 NOTE — Telephone Encounter (Signed)
Pt called and does not know why stopping dilantin.  Says she was not told.  I would check with Dr. Pearlean Brownie and call her back.

## 2013-04-30 DIAGNOSIS — E041 Nontoxic single thyroid nodule: Secondary | ICD-10-CM | POA: Diagnosis not present

## 2013-04-30 DIAGNOSIS — I69998 Other sequelae following unspecified cerebrovascular disease: Secondary | ICD-10-CM | POA: Diagnosis not present

## 2013-04-30 DIAGNOSIS — M6281 Muscle weakness (generalized): Secondary | ICD-10-CM | POA: Diagnosis not present

## 2013-04-30 DIAGNOSIS — I2510 Atherosclerotic heart disease of native coronary artery without angina pectoris: Secondary | ICD-10-CM | POA: Diagnosis not present

## 2013-04-30 DIAGNOSIS — E119 Type 2 diabetes mellitus without complications: Secondary | ICD-10-CM | POA: Diagnosis not present

## 2013-04-30 DIAGNOSIS — I10 Essential (primary) hypertension: Secondary | ICD-10-CM | POA: Diagnosis not present

## 2013-04-30 DIAGNOSIS — G40309 Generalized idiopathic epilepsy and epileptic syndromes, not intractable, without status epilepticus: Secondary | ICD-10-CM | POA: Diagnosis not present

## 2013-04-30 DIAGNOSIS — E7800 Pure hypercholesterolemia, unspecified: Secondary | ICD-10-CM | POA: Diagnosis not present

## 2013-04-30 NOTE — Telephone Encounter (Signed)
I spoke to patient and explained my reasoning for stopping dilantin as she has been episode free for several months but she insists on continuing it and wants to see another MD. She was asked to find another MD to prescribe her medicines and not return to see me. She would be given enough medicines to last her till sees another neurologist/ prescriber

## 2013-04-30 NOTE — Telephone Encounter (Signed)
I called pt and explained that she is only to take keppra per Dr. Marlis Edelson instruction.  The keppra is enough coverage for pt.  She asked if guarantee of not having a seizure and I said no one could give that kind of guarantee.   She wanted to continue dilantin.   I told her that once her medication ran out, this would not be something we would refill.  She would have to get from pcp.  She wanted to cancel EEG and get second opinion.   She then called back and spoke to her sister, and she was given keppra first ED admission and the second ED visit was given dilantin.  Per Dr. Marlis Edelson note noted opposite.  [place] to call pt [phone].

## 2013-05-05 ENCOUNTER — Other Ambulatory Visit: Payer: Medicare Other | Admitting: Radiology

## 2013-05-05 DIAGNOSIS — I69998 Other sequelae following unspecified cerebrovascular disease: Secondary | ICD-10-CM | POA: Diagnosis not present

## 2013-05-05 DIAGNOSIS — M6281 Muscle weakness (generalized): Secondary | ICD-10-CM | POA: Diagnosis not present

## 2013-05-05 DIAGNOSIS — E119 Type 2 diabetes mellitus without complications: Secondary | ICD-10-CM | POA: Diagnosis not present

## 2013-05-05 DIAGNOSIS — I10 Essential (primary) hypertension: Secondary | ICD-10-CM | POA: Diagnosis not present

## 2013-05-05 DIAGNOSIS — I2510 Atherosclerotic heart disease of native coronary artery without angina pectoris: Secondary | ICD-10-CM | POA: Diagnosis not present

## 2013-05-05 DIAGNOSIS — G40309 Generalized idiopathic epilepsy and epileptic syndromes, not intractable, without status epilepticus: Secondary | ICD-10-CM | POA: Diagnosis not present

## 2013-05-07 ENCOUNTER — Telehealth: Payer: Self-pay

## 2013-05-07 DIAGNOSIS — E119 Type 2 diabetes mellitus without complications: Secondary | ICD-10-CM | POA: Diagnosis not present

## 2013-05-07 DIAGNOSIS — I10 Essential (primary) hypertension: Secondary | ICD-10-CM | POA: Diagnosis not present

## 2013-05-07 DIAGNOSIS — R569 Unspecified convulsions: Secondary | ICD-10-CM

## 2013-05-07 DIAGNOSIS — I2510 Atherosclerotic heart disease of native coronary artery without angina pectoris: Secondary | ICD-10-CM | POA: Diagnosis not present

## 2013-05-07 DIAGNOSIS — M6281 Muscle weakness (generalized): Secondary | ICD-10-CM | POA: Diagnosis not present

## 2013-05-07 DIAGNOSIS — I69998 Other sequelae following unspecified cerebrovascular disease: Secondary | ICD-10-CM | POA: Diagnosis not present

## 2013-05-07 DIAGNOSIS — G40309 Generalized idiopathic epilepsy and epileptic syndromes, not intractable, without status epilepticus: Secondary | ICD-10-CM | POA: Diagnosis not present

## 2013-05-07 NOTE — Telephone Encounter (Signed)
Pt left v/m requesting another referral to neurologist; pt said neurologist wanted pt to stop taking Dilantin and pt is not comfortable stopping med. Pt said neurologist will not refill Dilantin and pt wants referral to a new neurologist.Please advise.

## 2013-05-07 NOTE — Telephone Encounter (Signed)
Ask her who she is seeing now and if she has a preference for doctor or area--thanks

## 2013-05-08 ENCOUNTER — Other Ambulatory Visit: Payer: Self-pay | Admitting: *Deleted

## 2013-05-08 MED ORDER — PHENYTOIN SODIUM EXTENDED 100 MG PO CAPS: 100.0000 mg | ORAL_CAPSULE | Freq: Three times a day (TID) | ORAL | Status: DC

## 2013-05-08 NOTE — Telephone Encounter (Signed)
Pt said she use to see Dr. Pearlean Brownie with [facility], and doesn't have a preference of doctor she just needs someone in [place], pt did want me to let you know she only has 2 days of dilantin left and would like you to refill med until she gets in with a new neurologist, please advise

## 2013-05-08 NOTE — Telephone Encounter (Signed)
Rx sent to pharmacy.  Back to MAT as requested.

## 2013-05-08 NOTE — Telephone Encounter (Signed)
Left voicemail requesting pt to call office 

## 2013-05-08 NOTE — Telephone Encounter (Signed)
Please refil her dilantin for 1 month and send this back to me, then I will do referral

## 2013-05-12 ENCOUNTER — Other Ambulatory Visit: Payer: Self-pay

## 2013-05-12 MED ORDER — LEVETIRACETAM 750 MG PO TABS: 750.0000 mg | ORAL_TABLET | Freq: Two times a day (BID) | ORAL | Status: DC

## 2013-05-12 NOTE — Telephone Encounter (Signed)
Please refill times one  

## 2013-05-12 NOTE — Telephone Encounter (Signed)
Pt left v/m requesting refill Keppra to [facility].Please advise. Pt request cb when refilled.

## 2013-05-13 DIAGNOSIS — M6281 Muscle weakness (generalized): Secondary | ICD-10-CM | POA: Diagnosis not present

## 2013-05-13 DIAGNOSIS — I69998 Other sequelae following unspecified cerebrovascular disease: Secondary | ICD-10-CM | POA: Diagnosis not present

## 2013-05-13 DIAGNOSIS — G40309 Generalized idiopathic epilepsy and epileptic syndromes, not intractable, without status epilepticus: Secondary | ICD-10-CM | POA: Diagnosis not present

## 2013-05-13 DIAGNOSIS — I10 Essential (primary) hypertension: Secondary | ICD-10-CM | POA: Diagnosis not present

## 2013-05-13 DIAGNOSIS — I2510 Atherosclerotic heart disease of native coronary artery without angina pectoris: Secondary | ICD-10-CM | POA: Diagnosis not present

## 2013-05-13 DIAGNOSIS — E119 Type 2 diabetes mellitus without complications: Secondary | ICD-10-CM | POA: Diagnosis not present

## 2013-05-14 DIAGNOSIS — M6281 Muscle weakness (generalized): Secondary | ICD-10-CM | POA: Diagnosis not present

## 2013-05-14 DIAGNOSIS — E119 Type 2 diabetes mellitus without complications: Secondary | ICD-10-CM | POA: Diagnosis not present

## 2013-05-14 DIAGNOSIS — I10 Essential (primary) hypertension: Secondary | ICD-10-CM | POA: Diagnosis not present

## 2013-05-14 DIAGNOSIS — I2510 Atherosclerotic heart disease of native coronary artery without angina pectoris: Secondary | ICD-10-CM | POA: Diagnosis not present

## 2013-05-14 DIAGNOSIS — G40309 Generalized idiopathic epilepsy and epileptic syndromes, not intractable, without status epilepticus: Secondary | ICD-10-CM | POA: Diagnosis not present

## 2013-05-14 DIAGNOSIS — I69998 Other sequelae following unspecified cerebrovascular disease: Secondary | ICD-10-CM | POA: Diagnosis not present

## 2013-05-19 DIAGNOSIS — R5381 Other malaise: Secondary | ICD-10-CM | POA: Diagnosis not present

## 2013-05-19 DIAGNOSIS — IMO0001 Reserved for inherently not codable concepts without codable children: Secondary | ICD-10-CM | POA: Diagnosis not present

## 2013-05-19 DIAGNOSIS — R5383 Other fatigue: Secondary | ICD-10-CM | POA: Diagnosis not present

## 2013-05-19 DIAGNOSIS — M6530 Trigger finger, unspecified finger: Secondary | ICD-10-CM | POA: Diagnosis not present

## 2013-05-19 DIAGNOSIS — G4700 Insomnia, unspecified: Secondary | ICD-10-CM | POA: Diagnosis not present

## 2013-05-20 DIAGNOSIS — I2510 Atherosclerotic heart disease of native coronary artery without angina pectoris: Secondary | ICD-10-CM | POA: Diagnosis not present

## 2013-05-20 DIAGNOSIS — I10 Essential (primary) hypertension: Secondary | ICD-10-CM | POA: Diagnosis not present

## 2013-05-20 DIAGNOSIS — I69998 Other sequelae following unspecified cerebrovascular disease: Secondary | ICD-10-CM | POA: Diagnosis not present

## 2013-05-20 DIAGNOSIS — M6281 Muscle weakness (generalized): Secondary | ICD-10-CM | POA: Diagnosis not present

## 2013-05-20 DIAGNOSIS — E119 Type 2 diabetes mellitus without complications: Secondary | ICD-10-CM | POA: Diagnosis not present

## 2013-05-20 DIAGNOSIS — G40309 Generalized idiopathic epilepsy and epileptic syndromes, not intractable, without status epilepticus: Secondary | ICD-10-CM | POA: Diagnosis not present

## 2013-05-22 ENCOUNTER — Other Ambulatory Visit: Payer: Medicare Other

## 2013-05-26 DIAGNOSIS — M6281 Muscle weakness (generalized): Secondary | ICD-10-CM | POA: Diagnosis not present

## 2013-05-26 DIAGNOSIS — I69998 Other sequelae following unspecified cerebrovascular disease: Secondary | ICD-10-CM | POA: Diagnosis not present

## 2013-05-26 DIAGNOSIS — G40309 Generalized idiopathic epilepsy and epileptic syndromes, not intractable, without status epilepticus: Secondary | ICD-10-CM | POA: Diagnosis not present

## 2013-05-26 DIAGNOSIS — I10 Essential (primary) hypertension: Secondary | ICD-10-CM | POA: Diagnosis not present

## 2013-05-26 DIAGNOSIS — I2510 Atherosclerotic heart disease of native coronary artery without angina pectoris: Secondary | ICD-10-CM | POA: Diagnosis not present

## 2013-05-26 DIAGNOSIS — E119 Type 2 diabetes mellitus without complications: Secondary | ICD-10-CM | POA: Diagnosis not present

## 2013-05-29 ENCOUNTER — Telehealth: Payer: Self-pay | Admitting: Family Medicine

## 2013-05-29 NOTE — Telephone Encounter (Signed)
Belenda Cruise from [facility] dropped off form for Dr. Esmeralda Arthur signature as the provider completing the Hospital follow up appointment on 03/09/13 for pt.  Please call Belenda Cruise when form is ready for pickup.  [phone].

## 2013-05-29 NOTE — Telephone Encounter (Signed)
For is in your in box.

## 2013-05-31 NOTE — Telephone Encounter (Signed)
Signed and placed in my outbox.

## 2013-06-02 DIAGNOSIS — I69998 Other sequelae following unspecified cerebrovascular disease: Secondary | ICD-10-CM | POA: Diagnosis not present

## 2013-06-02 DIAGNOSIS — G40309 Generalized idiopathic epilepsy and epileptic syndromes, not intractable, without status epilepticus: Secondary | ICD-10-CM | POA: Diagnosis not present

## 2013-06-02 DIAGNOSIS — M6281 Muscle weakness (generalized): Secondary | ICD-10-CM | POA: Diagnosis not present

## 2013-06-02 DIAGNOSIS — E119 Type 2 diabetes mellitus without complications: Secondary | ICD-10-CM | POA: Diagnosis not present

## 2013-06-02 DIAGNOSIS — I2510 Atherosclerotic heart disease of native coronary artery without angina pectoris: Secondary | ICD-10-CM

## 2013-06-04 DIAGNOSIS — E119 Type 2 diabetes mellitus without complications: Secondary | ICD-10-CM | POA: Diagnosis not present

## 2013-06-04 DIAGNOSIS — I10 Essential (primary) hypertension: Secondary | ICD-10-CM | POA: Diagnosis not present

## 2013-06-04 DIAGNOSIS — I69998 Other sequelae following unspecified cerebrovascular disease: Secondary | ICD-10-CM | POA: Diagnosis not present

## 2013-06-04 DIAGNOSIS — G40309 Generalized idiopathic epilepsy and epileptic syndromes, not intractable, without status epilepticus: Secondary | ICD-10-CM | POA: Diagnosis not present

## 2013-06-04 DIAGNOSIS — M6281 Muscle weakness (generalized): Secondary | ICD-10-CM | POA: Diagnosis not present

## 2013-06-04 DIAGNOSIS — I2510 Atherosclerotic heart disease of native coronary artery without angina pectoris: Secondary | ICD-10-CM | POA: Diagnosis not present

## 2013-06-05 ENCOUNTER — Ambulatory Visit: Payer: Self-pay | Admitting: Neurology

## 2013-06-08 ENCOUNTER — Telehealth: Payer: Self-pay | Admitting: Family Medicine

## 2013-06-08 DIAGNOSIS — I10 Essential (primary) hypertension: Secondary | ICD-10-CM

## 2013-06-08 DIAGNOSIS — Z0000 Encounter for general adult medical examination without abnormal findings: Secondary | ICD-10-CM

## 2013-06-08 DIAGNOSIS — E119 Type 2 diabetes mellitus without complications: Secondary | ICD-10-CM

## 2013-06-08 DIAGNOSIS — E049 Nontoxic goiter, unspecified: Secondary | ICD-10-CM

## 2013-06-08 DIAGNOSIS — Z7902 Long term (current) use of antithrombotics/antiplatelets: Secondary | ICD-10-CM | POA: Insufficient documentation

## 2013-06-08 DIAGNOSIS — E785 Hyperlipidemia, unspecified: Secondary | ICD-10-CM

## 2013-06-08 DIAGNOSIS — E871 Hypo-osmolality and hyponatremia: Secondary | ICD-10-CM

## 2013-06-08 NOTE — Telephone Encounter (Signed)
Message copied by Judy Pimple on Mon Jun 08, 2013  9:12 PM ------      Message from: Alvina Chou      Created: Thu Jun 04, 2013 11:12 AM      Regarding: Lab orders for Tuesday, 9.9.14       Patient is scheduled for CPX labs, please order future labs, Thanks , Terri       ------

## 2013-06-09 ENCOUNTER — Other Ambulatory Visit (INDEPENDENT_AMBULATORY_CARE_PROVIDER_SITE_OTHER): Payer: Medicare Other

## 2013-06-09 DIAGNOSIS — E785 Hyperlipidemia, unspecified: Secondary | ICD-10-CM

## 2013-06-09 DIAGNOSIS — Z0000 Encounter for general adult medical examination without abnormal findings: Secondary | ICD-10-CM

## 2013-06-09 DIAGNOSIS — E871 Hypo-osmolality and hyponatremia: Secondary | ICD-10-CM

## 2013-06-09 DIAGNOSIS — E119 Type 2 diabetes mellitus without complications: Secondary | ICD-10-CM

## 2013-06-09 DIAGNOSIS — I10 Essential (primary) hypertension: Secondary | ICD-10-CM | POA: Diagnosis not present

## 2013-06-09 DIAGNOSIS — E049 Nontoxic goiter, unspecified: Secondary | ICD-10-CM

## 2013-06-09 LAB — COMPREHENSIVE METABOLIC PANEL
ALT: 11 U/L (ref 0–35)
AST: 13 U/L (ref 0–37)
Albumin: 3.4 g/dL — ABNORMAL LOW (ref 3.5–5.2)
Alkaline Phosphatase: 87 U/L (ref 39–117)
BUN: 7 mg/dL (ref 6–23)
CO2: 30 mEq/L (ref 19–32)
Calcium: 9.2 mg/dL (ref 8.4–10.5)
Chloride: 102 mEq/L (ref 96–112)
Creatinine, Ser: 0.6 mg/dL (ref 0.4–1.2)
GFR: 120.14 mL/min (ref >60.00)
Glucose, Bld: 123 mg/dL — ABNORMAL HIGH (ref 70–99)
Potassium: 4 mEq/L (ref 3.5–5.1)
Sodium: 136 mEq/L (ref 135–145)
Total Bilirubin: 0.4 mg/dL (ref 0.3–1.2)
Total Protein: 6.7 g/dL (ref 6.0–8.3)

## 2013-06-09 LAB — LIPID PANEL
Cholesterol: 163 mg/dL (ref 0–200)
HDL: 38.4 mg/dL — ABNORMAL LOW (ref >39.00)
Total CHOL/HDL Ratio: 4
Triglycerides: 251 mg/dL — ABNORMAL HIGH (ref 0.0–149.0)
VLDL: 50.2 mg/dL — ABNORMAL HIGH (ref 0.0–40.0)

## 2013-06-09 LAB — CBC WITH DIFFERENTIAL/PLATELET
Basophils Absolute: 0 10*3/uL (ref 0.0–0.1)
Basophils Relative: 0.4 % (ref 0.0–3.0)
Eosinophils Absolute: 0.1 10*3/uL (ref 0.0–0.7)
Eosinophils Relative: 1.7 % (ref 0.0–5.0)
HCT: 36.5 % (ref 36.0–46.0)
Hemoglobin: 11.8 g/dL — ABNORMAL LOW (ref 12.0–15.0)
Lymphocytes Relative: 26.1 % (ref 12.0–46.0)
Lymphs Abs: 2.3 10*3/uL (ref 0.7–4.0)
MCHC: 32.2 g/dL (ref 30.0–36.0)
MCV: 72.5 fl — ABNORMAL LOW (ref 78.0–100.0)
Monocytes Absolute: 0.8 10*3/uL (ref 0.1–1.0)
Monocytes Relative: 8.8 % (ref 3.0–12.0)
Neutro Abs: 5.5 10*3/uL (ref 1.4–7.7)
Neutrophils Relative %: 63 % (ref 43.0–77.0)
Platelets: 295 10*3/uL (ref 150.0–400.0)
RBC: 5.04 Mil/uL (ref 3.87–5.11)
RDW: 18.1 % — ABNORMAL HIGH (ref 11.5–14.6)
WBC: 8.7 10*3/uL (ref 4.5–10.5)

## 2013-06-09 LAB — HEMOGLOBIN A1C: Hgb A1c MFr Bld: 9.3 % — ABNORMAL HIGH (ref 4.6–6.5)

## 2013-06-09 LAB — MICROALBUMIN / CREATININE URINE RATIO
Creatinine,U: 56 mg/dL
Microalb Creat Ratio: 3.4 mg/g (ref 0.0–30.0)
Microalb, Ur: 1.9 mg/dL (ref 0.0–1.9)

## 2013-06-09 LAB — LDL CHOLESTEROL, DIRECT: Direct LDL: 98 mg/dL

## 2013-06-09 LAB — TSH: TSH: 2.17 u[IU]/mL (ref 0.35–5.50)

## 2013-06-10 ENCOUNTER — Ambulatory Visit: Payer: Self-pay | Admitting: Neurology

## 2013-06-10 DIAGNOSIS — I2510 Atherosclerotic heart disease of native coronary artery without angina pectoris: Secondary | ICD-10-CM | POA: Diagnosis not present

## 2013-06-10 DIAGNOSIS — I69998 Other sequelae following unspecified cerebrovascular disease: Secondary | ICD-10-CM | POA: Diagnosis not present

## 2013-06-10 DIAGNOSIS — M6281 Muscle weakness (generalized): Secondary | ICD-10-CM | POA: Diagnosis not present

## 2013-06-10 DIAGNOSIS — G40309 Generalized idiopathic epilepsy and epileptic syndromes, not intractable, without status epilepticus: Secondary | ICD-10-CM | POA: Diagnosis not present

## 2013-06-10 DIAGNOSIS — E119 Type 2 diabetes mellitus without complications: Secondary | ICD-10-CM | POA: Diagnosis not present

## 2013-06-10 DIAGNOSIS — I10 Essential (primary) hypertension: Secondary | ICD-10-CM | POA: Diagnosis not present

## 2013-06-12 ENCOUNTER — Other Ambulatory Visit: Payer: Self-pay | Admitting: Family Medicine

## 2013-06-15 ENCOUNTER — Telehealth: Payer: Self-pay | Admitting: Family Medicine

## 2013-06-15 NOTE — Telephone Encounter (Signed)
Recd records from [facility]. Forwarding 20pgs to Dr.Tower

## 2013-06-16 ENCOUNTER — Ambulatory Visit (INDEPENDENT_AMBULATORY_CARE_PROVIDER_SITE_OTHER): Payer: Medicare Other | Admitting: Family Medicine

## 2013-06-16 ENCOUNTER — Encounter: Payer: Self-pay | Admitting: Family Medicine

## 2013-06-16 VITALS — BP 104/64 | HR 83 | Temp 98.5°F | Ht <= 58 in | Wt 149.0 lb

## 2013-06-16 DIAGNOSIS — I10 Essential (primary) hypertension: Secondary | ICD-10-CM

## 2013-06-16 DIAGNOSIS — Z0000 Encounter for general adult medical examination without abnormal findings: Secondary | ICD-10-CM

## 2013-06-16 DIAGNOSIS — Z23 Encounter for immunization: Secondary | ICD-10-CM | POA: Diagnosis not present

## 2013-06-16 DIAGNOSIS — E119 Type 2 diabetes mellitus without complications: Secondary | ICD-10-CM

## 2013-06-16 DIAGNOSIS — F17200 Nicotine dependence, unspecified, uncomplicated: Secondary | ICD-10-CM

## 2013-06-16 DIAGNOSIS — E785 Hyperlipidemia, unspecified: Secondary | ICD-10-CM | POA: Diagnosis not present

## 2013-06-16 NOTE — Assessment & Plan Note (Signed)
Lab Results  Component Value Date   HGBA1C 9.3* 06/09/2013   Pt continues to see endocrinologist Dr Talmage Nap Enc wt loss and better diet

## 2013-06-16 NOTE — Assessment & Plan Note (Signed)
Disc in detail risks of smoking and possible outcomes including copd, vascular/ heart disease, cancer , respiratory and sinus infections  Pt voices understanding  She is not yet ready to quit  

## 2013-06-16 NOTE — Progress Notes (Signed)
Subjective:    Patient ID: Diane [name], female    DOB: 12-18-1955, 57 y.o.   MRN: [id number]  HPI I have personally reviewed the Medicare Annual Wellness questionnaire and have noted 1. The patient's medical and social history 2. Their use of alcohol, tobacco or illicit drugs 3. Their current medications and supplements 4. The patient's functional ability including ADL's, fall risks, home safety risks and hearing or visual             impairment. 5. Diet and physical activities 6. Evidence for depression or mood disorders  Has had a rough time  hosp several times for pseudo seizures - no more episodes   Wt is up 3 lb  Is eating better - a lot of fresh vegetables lately  A little walking is all she can do   The patients weight, height, BMI have been recorded in the chart and visual acuity is per eye clinic.  I have made referrals, counseling and provided education to the patient based review of the above and I have provided the pt with a written personalized care plan for preventive services.  See scanned forms.  Routine anticipatory guidance given to patient.  See health maintenance. Flu--will do that today Shingles- would be interested if ins would pay - she will find out  PNA- up to date on vaccine from 2010 Tetanus 2/11 vaccine  Colon 6/09 colonoscopy - 10 year recall (has hx of polyps)  Breast cancer screening - needs to schedule her mammogram at the breast center No lumps on self exam (always generally lumpy) She has kept up with her pap smears due to HPV status - will be due in jan 2015   Advance directive-- does not have a living will - but her sister is POA and she will talk to her about that  Cognitive function addressed- see scanned forms- and if abnormal then additional documentation follows.  She has no worries about memory  Falls- had fallen when she had a seizure- but none since   Mood- is pretty good overall - and she is quite pleased with it  lately  Smoking - smoking about the same - is experimenting with vapor cigarettes and they may help her  Not ready to quit yet  Her apt is going smoke free OCT 1  Hyperlipidemia Lab Results  Component Value Date   CHOL 163 06/09/2013   HDL 38.40* 06/09/2013   LDLCALC 46 06/13/2011   LDLDIRECT 98.0 06/09/2013   TRIG 251.0* 06/09/2013   CHOLHDL 4 06/09/2013   on statin and diet   bp is stable today  No cp or palpitations or headaches or edema  No side effects to medicines  BP Readings from Last 3 Encounters:  06/16/13 104/64  04/28/13 99/63  03/25/13 140/86     Anemia is improved Lab Results  Component Value Date   WBC 8.7 06/09/2013   HGB 11.8* 06/09/2013   HCT 36.5 06/09/2013   MCV 72.5* 06/09/2013   PLT 295.0 06/09/2013     DM care  Sees Dr Talmage Nap and doing much better  Inc her levemir to 50 u at night - then seldom needs the short acting insulin  opthy- 8/14 and had a good exam  Lab Results  Component Value Date   HGBA1C 9.3* 06/09/2013   had few episodes of low sugar- will let Dr Talmage Nap know next week    PMH and SH reviewed  Meds, vitals, and allergies reviewed.   ROS: See  HPI.  Otherwise negative.     Patient Active Problem List   Diagnosis Date Noted  . Encounter for Medicare annual wellness exam 06/16/2013  . Routine general medical examination at a health care facility 06/08/2013  . Seizures 03/25/2013  . Goiter 09/01/2012  . Other screening mammogram 06/13/2012  . Allergic rhinitis 06/13/2012  . Chronic bronchitis 04/02/2012  . Hyponatremia 12/11/2011  . Carotid artery disease 08/20/2011  . Other dysphagia 09/14/2010  . DIZZINESS 07/26/2010  . OBSTRUCTIVE SLEEP APNEA 09/18/2007  . TOBACCO USE 08/08/2007  . DIABETES MELLITUS, TYPE II 03/26/2007  . HYPERLIPIDEMIA 03/26/2007  . ANXIETY 03/26/2007  . DEPRESSION 03/26/2007  . MIGRAINE HEADACHE 03/26/2007  . CARPAL TUNNEL SYNDROME, BILATERAL 03/26/2007  . HYPERTENSION 03/26/2007  . CORONARY ARTERY DISEASE 03/26/2007   . CVA 03/26/2007  . GERD 03/26/2007  . FIBROMYALGIA 03/26/2007   Past Medical History  Diagnosis Date  . Coronary artery disease     post CABG in 3/07   . Hypertension   . Hyperlipidemia   . Dyslipidemia   . Diabetes mellitus     type 2  . Chronic bronchitis   . CVA (cerebral infarction) 1993  . Tobacco abuse   . COPD (chronic obstructive pulmonary disease)   . Stroke 1993  . Seizures   . Carotid stenosis   . Systolic murmur     known mild AS and MR   Past Surgical History  Procedure Laterality Date  . Coronary stent placement  08/11/06    PCI of her ciurcumflex/OM vessel  . Cardiac catheterization  11/29/05    EF of 55%  . Cardiac catheterization  08/06/06    EF of 45-50%  . Coronary artery bypass graft  12/04/2005    x5 -- left internal mammary artery to the LAD, left radial artery to the ramus intermedius, saphenous vein graft to the obtuse marginal 1, sequential saphenous vein grat to the acute marginal and posterior descending, endoscopic vein harvesting from the left thigh with open vein harvest from right leg   History  Substance Use Topics  . Smoking status: Current Some Day Smoker -- 1.00 packs/day for 36 years    Types: Cigarettes  . Smokeless tobacco: Never Used     Comment: 1.5-2 ppd  . Alcohol Use: No   Family History  Problem Relation Age of Onset  . Coronary artery disease    . Heart disease    . Heart disease Mother   . Diabetes Mother   . COPD Mother   . Hyperlipidemia Mother   . Hypertension Mother   . Cancer Father   . Drug abuse Paternal Grandmother   . Stroke Paternal Grandfather    Allergies  Allergen Reactions  . Ace Inhibitors     REACTION: cough  . Bee Venom   . Eggs Or Egg-Derived Products     Abdominal discomfort-no allergy She can get a flu shot   . Tetracycline     REACTION: reaction not known   Current Outpatient Prescriptions on File Prior to Visit  Medication Sig Dispense Refill  . albuterol (PROVENTIL) (2.5 MG/3ML)  0.083% nebulizer solution Take 3 mLs (2.5 mg total) by nebulization every 6 (six) hours as needed.  360 mL  3  . aspirin 325 MG tablet Take 1 tablet (325 mg total) by mouth daily.  90 tablet  0  . carvedilol (COREG) 12.5 MG tablet Take 12.5 mg by mouth 2 (two) times daily with a meal.      .  cetirizine (ZYRTEC) 10 MG tablet Take 1 tablet (10 mg total) by mouth daily.  90 tablet  0  . dexlansoprazole (DEXILANT) 60 MG capsule Take 1 capsule (60 mg total) by mouth daily.  30 capsule  11  . docusate sodium (COLACE) 100 MG capsule Take 1 capsule (100 mg total) by mouth 2 (two) times daily.  180 capsule  0  . guaiFENesin (MUCINEX) 600 MG 12 hr tablet Take 2 tablets (1,200 mg total) by mouth 2 (two) times daily.  360 tablet  0  . hydrochlorothiazide (HYDRODIURIL) 50 MG tablet Take 50 mg by mouth daily.      . insulin aspart (NOVOLOG) 100 UNIT/ML injection Inject into the skin 3 (three) times daily with meals. Sliding scale      . isosorbide mononitrate (IMDUR) 30 MG 24 hr tablet Take 30 mg by mouth 2 (two) times daily.      [name] levETIRAcetam (KEPPRA) 750 MG tablet Take 1 tablet (750 mg total) by mouth 2 (two) times daily.  60 tablet  1  . LORazepam (ATIVAN) 0.5 MG tablet Take 0.25 mg by mouth every 6 (six) hours as needed for anxiety.      [name] losartan (COZAAR) 100 MG tablet Take 1 tablet (100 mg total) by mouth daily.  90 tablet  3  . nitroGLYCERIN (NITROSTAT) 0.4 MG SL tablet Place 1 tablet (0.4 mg total) under the tongue every 5 (five) minutes as needed.  25 tablet  11  . Omega-3 Fatty Acids ([facility] NATURAL FISH OIL) 1200 MG CAPS Take 1 capsule by mouth 2 (two) times daily.  180 capsule  0  . phenytoin (DILANTIN) 100 MG ER capsule TAKE 1 CAPSULE (100 MG TOTAL) BY MOUTH 3 (THREE) TIMES DAILY.  90 capsule  0  . potassium chloride (K-DUR) 10 MEQ tablet Take 10 mEq by mouth 2 (two) times daily.      . ranolazine (RANEXA) 500 MG 12 hr tablet Take 500 mg by mouth 2 (two) times daily.      . rosuvastatin (CRESTOR)  10 MG tablet Take 10 mg by mouth daily.       No current facility-administered medications on file prior to visit.    Review of Systems Review of Systems  Constitutional: Negative for fever, appetite change,  and unexpected weight change.  Eyes: Negative for pain and visual disturbance.  Respiratory: Negative for cough and shortness of breath.   Cardiovascular: Negative for cp or palpitations    Gastrointestinal: Negative for nausea, diarrhea and constipation.  Genitourinary: Negative for urgency and frequency.  msk pos for chronic back and myofasicla pain  Skin: Negative for pallor or rash   Neurological: Negative for weakness, light-headedness, numbness and headaches.pos for ? Seizures   Hematological: Negative for adenopathy. Does not bruise/bleed easily.  Psychiatric/Behavioral: Negative for dysphoric mood. The patient is anxious at times         Objective:   Physical Exam  Constitutional: She appears well-developed and well-nourished.  obese and well appearing   HENT:  Head: Normocephalic and atraumatic.  Right Ear: External ear normal.  Left Ear: External ear normal.  Nose: Nose normal.  Mouth/Throat: Oropharynx is clear and moist.  Eyes: Conjunctivae and EOM are normal. Pupils are equal, round, and reactive to light. Right eye exhibits no discharge. Left eye exhibits no discharge. No scleral icterus.  Neck: Normal range of motion. Neck supple. No JVD present. Carotid bruit is not present. No thyromegaly present.  Cardiovascular: Normal rate, regular rhythm, normal heart  sounds and intact distal pulses.  Exam reveals no gallop.   Pulmonary/Chest: Effort normal and breath sounds normal. No respiratory distress. She has no wheezes. She has no rales.  Abdominal: Soft. Bowel sounds are normal. She exhibits no distension, no abdominal bruit and no mass. There is no tenderness.  Genitourinary: No breast swelling, tenderness, discharge or bleeding.  Breast exam: No mass, nodules,  thickening, tenderness, bulging, retraction, inflamation, nipple discharge or skin changes noted.  No axillary or clavicular LA.  Chaperoned exam.    Musculoskeletal: She exhibits no edema and no tenderness.  Poor rom of back  Lymphadenopathy:    She has no cervical adenopathy.  Neurological: She is alert. She has normal reflexes. No cranial nerve deficit. She exhibits normal muscle tone. Coordination normal.  Skin: Skin is warm and dry. No rash noted. No erythema. No pallor.  Psychiatric: She has a normal mood and affect.          Assessment & Plan:

## 2013-06-16 NOTE — Assessment & Plan Note (Signed)
Reviewed health habits including diet and exercise and skin cancer prevention Also reviewed health mt list, fam hx and immunizations  See HPI Labs reviewed

## 2013-06-16 NOTE — Patient Instructions (Addendum)
Flu vaccine today If you are interested in a shingles/zoster vaccine - call your insurance to check on coverage,( you should not get it within 1 month of other vaccines) , then call us for a prescription  for it to take to a pharmacy that gives the shot , or make a nurse visit to get it here depending on your coverage I will send a copy of your labs to Dr Talmage Nap and Dr Jon Billings Take care of yourself - stay as active as you can , continue the healthy eating  Keep thinking about quitting smoking

## 2013-06-16 NOTE — Assessment & Plan Note (Signed)
bp in fair control at this time  No changes needed  Disc lifstyle change with low sodium diet and exercise  Lab rev 

## 2013-06-16 NOTE — Assessment & Plan Note (Signed)
Statin and diet HDL low- needs to exercise  Disc goals for lipids and reasons to control them Rev labs with pt Rev low sat fat diet in detail  Pt has multi vessel atherosclerosis

## 2013-06-17 ENCOUNTER — Telehealth (HOSPITAL_COMMUNITY): Payer: Self-pay

## 2013-06-17 NOTE — Telephone Encounter (Signed)
06/17/13 10:20am Called and left msg due to referral from [facility] - waiting for a call back.Marland KitchenMarguerite Olea

## 2013-06-18 ENCOUNTER — Other Ambulatory Visit: Payer: Self-pay | Admitting: Endocrinology

## 2013-06-18 DIAGNOSIS — E041 Nontoxic single thyroid nodule: Secondary | ICD-10-CM

## 2013-06-18 DIAGNOSIS — E7800 Pure hypercholesterolemia, unspecified: Secondary | ICD-10-CM | POA: Diagnosis not present

## 2013-06-18 DIAGNOSIS — I10 Essential (primary) hypertension: Secondary | ICD-10-CM | POA: Diagnosis not present

## 2013-06-18 DIAGNOSIS — E119 Type 2 diabetes mellitus without complications: Secondary | ICD-10-CM | POA: Diagnosis not present

## 2013-06-23 ENCOUNTER — Encounter: Payer: Self-pay | Admitting: Neurology

## 2013-06-23 ENCOUNTER — Other Ambulatory Visit: Payer: Self-pay | Admitting: Emergency Medicine

## 2013-06-23 ENCOUNTER — Ambulatory Visit (INDEPENDENT_AMBULATORY_CARE_PROVIDER_SITE_OTHER): Payer: Medicare Other | Admitting: Neurology

## 2013-06-23 VITALS — BP 126/68 | HR 88 | Temp 97.7°F | Ht <= 58 in | Wt 148.0 lb

## 2013-06-23 DIAGNOSIS — Z79899 Other long term (current) drug therapy: Secondary | ICD-10-CM | POA: Diagnosis not present

## 2013-06-23 DIAGNOSIS — I6350 Cerebral infarction due to unspecified occlusion or stenosis of unspecified cerebral artery: Secondary | ICD-10-CM

## 2013-06-23 DIAGNOSIS — G40209 Localization-related (focal) (partial) symptomatic epilepsy and epileptic syndromes with complex partial seizures, not intractable, without status epilepticus: Secondary | ICD-10-CM

## 2013-06-23 MED ORDER — LEVETIRACETAM 750 MG PO TABS: 750.0000 mg | ORAL_TABLET | Freq: Two times a day (BID) | ORAL | Status: DC

## 2013-06-23 MED ORDER — PHENYTOIN SODIUM EXTENDED 100 MG PO CAPS: 100.0000 mg | ORAL_CAPSULE | Freq: Three times a day (TID) | ORAL | Status: DC

## 2013-06-23 NOTE — Progress Notes (Signed)
NEUROLOGY CONSULTATION NOTE  Diane [name] MRN: [id number] DOB: 1956-05-25  Referring provider: Dr. Milinda Antis Primary care provider: Dr. Milinda Antis  Reason for consult:  Seizures.  HISTORY OF PRESENT ILLNESS: Diane [name] is a 57 year old right-handed woman with history of CAD s/p CABG, hypertension, hyperlipidemia, type II diabetes mellitus, tobacco use, stroke, depression, fibromyalgia, migraine and COPD who presents for seizures.  She was previously followed by Dr. Delia Heady at [facility].  Records and images were personally reviewed where available.    She was admitted to [facility] from 03/03/13 to 03/06/13 for recurrent seizure-like episodes.  These spells are were witnessed by Dr. Noel Christmas, neuro-hospitalist, and described as "head turning to the right as well as eye deviation to the right, stiffening of right extremities with flexion of right upper extremity, and convulsive movements of right extremities. Spell lasted about 40-50 seconds and resolved without intervention. Within 30 seconds patient was again awake and conversant. She was slightly confused with respect to time."  She is unable to speak during these events.  Sometimes she was aware during these events and other times she was unaware and amnestic.  She did have urinary incontinence but no tongue biting.  It is reported that she can write down what she wants to say during these spells.  Given her stroke history, she was evaluated by Dr. Pearlean Brownie for possible left hemispheric stroke.  By his assessment, it seemed that her speech difficulties were non-organic, seizure-like activity non-organic, and he did not suspect acute stroke.    She had continuous EEG monitoring, which captured multiple spells without electrographic correlate, however there are no details describing these events captured in the hospital notes.   However, she did have one captured clinical event, described as "build up to rhythmical theta  activity and resultant slowing companied by clinical changes.  This may very well represent epileptiform activity.  No interictal abnormalities were noted".  Psychiatry was consulted, regarding possible pseudoseizures.  Despite the possible diagnosis of pseudoseizures, the neuro-hospitalists never made a sole diagnosis of pseudoseizures in the hospital notes.    MRI of brain performed on 03/03/13, which reported possible tiny acute infarct in the posterior left frontal lobe, but neurology believed this to be artifact.  There was also mention of incidental tiny cysts in right hippocampal region.  Encephalomalacia in the right frontoparietal region, from prior remote stroke, is seen.    She was discharged to [facility] on Keppra 750mg  BID, where she was having continued spells, so she presented again to [facility].  She was re-admitted from 03/14/13 to 03/18/13.  She had witnessed spells, consisting of generalized upper body shaking with fixed stare (sometimes more in the right arm), lasting about 30 seconds.  Head CT from 03/14/13 was stable.  After Dilantin was added, the spells were controlled.  She continued on Dilantin 100mg  TID and Keppra 750mg  BID, tolerating it well.  She has not had any further spells since hospital discharge on 03/18/13.  She had a stroke in 1993 secondary to right carotid artery dissection.  She currently takes ASA and Plavix.  Of note, she recently discontinued gabapentin (gradual taper) prior to onset of these spells, which she took for fibromyalgia.  She notes an uncle with history of seizures.  When she followed up with Dr. Pearlean Brownie after her hospital admission, he discontinued phenytoin and had her remain on Keppra.  He also referred her to outpatient behavioral health psychiatry for transfer of care from [facility], at patient's request.  She was afraid to discontinue the phenytoin, however, since her spells have resolved since starting it.  I personally  reviewed the EEG.  At 1:57:48, patient pauses, head slightly turned left, staring, with little movement and no convulsions.  She toes pull at the covers a little, but no appreciable stereotypical hand movements.  When it is over, she begins moving around again.  Electrographically, she does have a build up of generalized theta activity up until 1:58:26, followed by generalized delta slowing lasting until 1:58:45.  Difficult to lateralize, but may be more prominent in the left hemisphere.  However, this appears to only be a clip of the event and the rest of the study is not available to me.  Therefore I cannot comment on the other captured spells.  06/09/13: Hgb A1c 9.3, TSH 2.17, LDL 98, albumin 3.4, glucose 123, WBC 8.7, Hgb 11.8, HCT 36.5, PLT 295, Na 136, K 4.0, BUN 7, Cr 0.6, AP 87, AST 13, ALT 11, Ca 9.3 03/18/13: phenytoin level 11.  PAST MEDICAL HISTORY: Past Medical History  Diagnosis Date  . Coronary artery disease     post CABG in 3/07   . Hypertension   . Hyperlipidemia   . Dyslipidemia   . Diabetes mellitus     type 2  . Chronic bronchitis   . CVA (cerebral infarction) 1993  . Tobacco abuse   . COPD (chronic obstructive pulmonary disease)   . Stroke 1993  . Seizures   . Carotid stenosis   . Systolic murmur     known mild AS and MR    PAST SURGICAL HISTORY: Past Surgical History  Procedure Laterality Date  . Coronary stent placement  08/11/06    PCI of her ciurcumflex/OM vessel  . Cardiac catheterization  11/29/05    EF of 55%  . Cardiac catheterization  08/06/06    EF of 45-50%  . Coronary artery bypass graft  12/04/2005    x5 -- left internal mammary artery to the LAD, left radial artery to the ramus intermedius, saphenous vein graft to the obtuse marginal 1, sequential saphenous vein grat to the acute marginal and posterior descending, endoscopic vein harvesting from the left thigh with open vein harvest from right leg    MEDICATIONS: Current Outpatient Prescriptions on  File Prior to Visit  Medication Sig Dispense Refill  . albuterol (PROVENTIL) (2.5 MG/3ML) 0.083% nebulizer solution Take 3 mLs (2.5 mg total) by nebulization every 6 (six) hours as needed.  360 mL  3  . aspirin 325 MG tablet Take 1 tablet (325 mg total) by mouth daily.  90 tablet  0  . carvedilol (COREG) 12.5 MG tablet Take 12.5 mg by mouth 2 (two) times daily with a meal.      . cetirizine (ZYRTEC) 10 MG tablet Take 1 tablet (10 mg total) by mouth daily.  90 tablet  0  . dexlansoprazole (DEXILANT) 60 MG capsule Take 1 capsule (60 mg total) by mouth daily.  30 capsule  11  . guaiFENesin (MUCINEX) 600 MG 12 hr tablet Take 2 tablets (1,200 mg total) by mouth 2 (two) times daily.  360 tablet  0  . hydrochlorothiazide (HYDRODIURIL) 50 MG tablet Take 50 mg by mouth daily.      . insulin aspart (NOVOLOG) 100 UNIT/ML injection Inject into the skin 3 (three) times daily with meals. Sliding scale      . insulin detemir (LEVEMIR) 100 UNIT/ML injection Inject into the skin at bedtime.  Take up to 50 uints at night      . isosorbide mononitrate (IMDUR) 30 MG 24 hr tablet Take 30 mg by mouth 2 (two) times daily.      [name] LORazepam (ATIVAN) 0.5 MG tablet Take 0.25 mg by mouth every 6 (six) hours as needed for anxiety.      [name] losartan (COZAAR) 100 MG tablet Take 1 tablet (100 mg total) by mouth daily.  90 tablet  3  . nitroGLYCERIN (NITROSTAT) 0.4 MG SL tablet Place 1 tablet (0.4 mg total) under the tongue every 5 (five) minutes as needed.  25 tablet  11  . Omega-3 Fatty Acids ([employer] NATURAL FISH OIL) 1200 MG CAPS Take 1 capsule by mouth 2 (two) times daily.  180 capsule  0  . potassium chloride (K-DUR) 10 MEQ tablet Take 10 mEq by mouth 2 (two) times daily.      . ranolazine (RANEXA) 500 MG 12 hr tablet Take 500 mg by mouth 2 (two) times daily.      . rosuvastatin (CRESTOR) 10 MG tablet Take 10 mg by mouth daily.      [name] docusate sodium (COLACE) 100 MG capsule Take 1 capsule (100 mg total) by mouth 2 (two) times  daily.  180 capsule  0   No current facility-administered medications on file prior to visit.    ALLERGIES: Allergies  Allergen Reactions  . Ace Inhibitors     REACTION: cough  . Bee Venom   . Eggs Or Egg-Derived Products     Abdominal discomfort-no allergy She can get a flu shot   . Tetracycline     REACTION: reaction not known    FAMILY HISTORY: Family History  Problem Relation Age of Onset  . Coronary artery disease    . Heart disease    . Heart disease Mother   . Diabetes Mother   . COPD Mother   . Hyperlipidemia Mother   . Hypertension Mother   . Cancer Father   . Drug abuse Paternal Grandmother   . Stroke Paternal Grandfather     SOCIAL HISTORY: History   Social History  . Marital Status: Divorced    Spouse Name: N/A    Number of Children: N/A  . Years of Education: N/A   Occupational History  . Not on file.   Social History Main Topics  . Smoking status: Current Some Day Smoker -- 1.00 packs/day for 36 years    Types: Cigarettes  . Smokeless tobacco: Never Used     Comment: 1.5-2 ppd  . Alcohol Use: No  . Drug Use: No  . Sexual Activity: Not Currently    Partners: Male    Birth Control/ Protection: None   Other Topics Concern  . Not on file   Social History Narrative  . No narrative on file    REVIEW OF SYSTEMS: Constitutional: No fevers, chills, or sweats, no generalized fatigue, change in appetite Eyes: No visual changes, double vision, eye pain Ear, nose and throat: No hearing loss, ear pain, nasal congestion, sore throat Cardiovascular: No chest pain, palpitations Respiratory:  No shortness of breath at rest or with exertion, wheezes GastrointestinaI: No nausea, vomiting, diarrhea, abdominal pain, fecal incontinence Genitourinary:  No dysuria, urinary retention or frequency Musculoskeletal:  No neck pain, back pain Integumentary: No rash, pruritus, skin lesions Neurological: as above Psychiatric: No depression, insomnia,  anxiety Endocrine: No palpitations, fatigue, diaphoresis, mood swings, change in appetite, change in weight, increased thirst Hematologic/Lymphatic:  No anemia, purpura, petechiae.  Allergic/Immunologic: no itchy/runny eyes, nasal congestion, recent allergic reactions, rashes  PHYSICAL EXAM: Filed Vitals:   06/23/13 1246  BP: 126/68  Pulse: 88  Temp: 97.7 F (36.5 C)   General: No acute distress Head:  Normocephalic/atraumatic Neck: supple, no paraspinal tenderness, full range of motion Back: No paraspinal tenderness Heart: regular rate and rhythm Lungs: Clear to auscultation bilaterally. Vascular: No carotid bruits. Neurological Exam: Mental status: alert and oriented to person, place, and time, speech fluent and not dysarthric, language intact. Cranial nerves: CN I: not tested CN II: pupils equal, round and reactive to light, visual fields intact, fundi unremarkable. CN III, IV, VI:  full range of motion, no nystagmus, no ptosis CN V: facial sensation intact CN VII: upper and lower face symmetric CN VIII: hearing intact CN IX, X: gag intact, uvula midline CN XI: sternocleidomastoid and trapezius muscles intact CN XII: tongue midline Bulk & Tone: normal, no fasciculations. Motor: 5/5 throughout Sensation: temperature and vibration intact Deep Tendon Reflexes: 2+ throughout, toes down Finger to nose testing: normal Heel to shin: normal Gait: normal stride, difficulty with tandem. Romberg negative.  IMPRESSION & PLAN:  Probable complex partial seizures.  There is one spell captured on EEG that did reveal rhythmic background changes, suggesting epileptic activity.  Reportedly, she had multiple other habitual spells that did not have electrographic correlate.  The EEG involving other spells are not available to me, so I cannot comment.  There is a question of non-epileptic spells, however, given the one spell with a clear electrographic correlate, I would certainly treat for  seizure disorder. 1.  Continue Dilantin ER 100mg  TID for now.  Long-term use is not ideal due to side effects, but we will continue it for now. 2.  Continue Keppra 750mg  BID. 3.  Check DEXA scan and recommended starting calcium-vitamin D supplement to prevent osteopenia. 4.  Informed patient of [place] law stating that a patient must be seizure-free for 6 months before driving privileges are reinstated. 5.  Follow up carotid doppler with Dr. Swaziland.  For secondary stroke prevention, may continue plavix and discontinue asa for secondary stroke prevention (unless there is a cardiac reason for asa) as both agents together do not provide added benefit. 6.  Follow up in 3 months.  Thank you for allowing me to take part in the care of this patient.  Shon Millet, DO  CC:  [name], MD  Peter Swaziland, MD

## 2013-06-23 NOTE — Patient Instructions (Addendum)
1. Continue Keppra 750mg  twice daily and Dilantin 100mg  three times daily.  If medication has been prescribed for you to prevent seizures, take it exactly as directed.  Do not stop taking the medicine without talking to your doctor first, even if you have not had a seizure in a long time. Side effects discussed, including suicidal ideation.  2. Avoid activities in which a seizure would cause danger to yourself or to others.  Don't operate dangerous machinery, swim alone, or climb in high or dangerous places, such as on ladders, roofs, or girders.  Do not drive unless your doctor says you may.  3. Wear an emergency medical identification bracelet with information about your seizure.  If you have a seizure, people around you will know what is wrong and get appropriate help.  4. If you have any warning that you may have a seizure, lay down in a safe place where you can't hurt yourself.    5.  No driving for 6 months from last seizure, as per [facility] law (which would be December 18).   Please refer to the following link on the Epile[employer] of America's website for more information: http://www.epilepsyfoundation.org/answerplace/Social/driving/drivingu.cfm   6.  Maintain good sleep hygiene.  7.  Notify your neurology if you are planning pregnancy or if you become pregnant.  8.  Contact your doctor if you have any problems that may be related to the medicine you are taking.  9.  Call 911 and bring the patient back to the ED if:        A.  The seizure lasts longer than 5 minutes.       B.  The patient doesn't awaken shortly after the seizure  C.  The patient has new problems such as difficulty seeing, speaking or moving  D.  The patient was injured during the seizure  E.  The patient has a temperature over 102 F (39C)  F.  The patient vomited and now is having trouble breathing  10.  Since Dilantin can contribute to osteopenia, we will check a DEXA scan.  Dilantin is not ideal to  stay on in the long term due to side effects, but we will continue it for now.  11.  In regards to secondary stroke prevention, you do not need to be on both aspirin and plavix (if you are on plavix for stents, then ask Dr. Swaziland about discontinuing aspirin).  12.  Follow up in 3 months.        I will call you about the Dexa scan.

## 2013-06-26 ENCOUNTER — Ambulatory Visit: Payer: Medicare Other | Admitting: Family Medicine

## 2013-07-03 ENCOUNTER — Other Ambulatory Visit: Payer: Self-pay | Admitting: Family Medicine

## 2013-07-03 DIAGNOSIS — Z13820 Encounter for screening for osteoporosis: Secondary | ICD-10-CM | POA: Diagnosis not present

## 2013-07-06 ENCOUNTER — Telehealth (HOSPITAL_COMMUNITY): Payer: Self-pay

## 2013-07-06 NOTE — Telephone Encounter (Signed)
07/06/13 1:05pm this is the second call-out to pt to make an appt the 1st call-out was 06/17/13 @ 10:20am - still waiting for feedback. - this pt was referred to [place] from [facility] - for Chronic Depression an dracing thoughts/sh

## 2013-07-07 ENCOUNTER — Telehealth: Payer: Self-pay | Admitting: Neurology

## 2013-07-07 NOTE — Telephone Encounter (Signed)
Message copied by Benay Spice on Tue Jul 07, 2013  1:51 PM ------      Message from: JAFFE, ADAM R      Created: Tue Jul 07, 2013  1:41 PM       Ms. Hauter Bone Density Report does reveal osteopenia.  This suggests that Dilantin is not the best option for her in the long run, as it too can cause osteopenia.  Since her seizures were just recently controlled, I wouldn't make any changes for now.  I recommend taking supplemental calcium 1500mg  and vitamin D 1000 IU daily.  We will have to repeat another Bone scan in a year. ------

## 2013-07-07 NOTE — Telephone Encounter (Signed)
Diane [name] a message to return my call.

## 2013-07-09 NOTE — Telephone Encounter (Signed)
Left a message for the patient to return my call.  

## 2013-07-10 ENCOUNTER — Other Ambulatory Visit: Payer: Self-pay | Admitting: Family Medicine

## 2013-07-13 ENCOUNTER — Encounter: Payer: Self-pay | Admitting: Neurology

## 2013-07-13 NOTE — Telephone Encounter (Signed)
Mailed a letter to the patient.  

## 2013-07-20 DIAGNOSIS — IMO0001 Reserved for inherently not codable concepts without codable children: Secondary | ICD-10-CM | POA: Diagnosis not present

## 2013-07-20 DIAGNOSIS — R5383 Other fatigue: Secondary | ICD-10-CM | POA: Diagnosis not present

## 2013-07-20 DIAGNOSIS — M6530 Trigger finger, unspecified finger: Secondary | ICD-10-CM | POA: Diagnosis not present

## 2013-07-20 DIAGNOSIS — R5381 Other malaise: Secondary | ICD-10-CM | POA: Diagnosis not present

## 2013-07-20 DIAGNOSIS — G4700 Insomnia, unspecified: Secondary | ICD-10-CM | POA: Diagnosis not present

## 2013-07-27 ENCOUNTER — Ambulatory Visit
Admission: RE | Admit: 2013-07-27 | Discharge: 2013-07-27 | Disposition: A | Discharge status: Home / Self Care | Payer: Medicare Other | Source: Ambulatory Visit | Attending: Endocrinology | Admitting: Endocrinology

## 2013-07-27 DIAGNOSIS — E079 Disorder of thyroid, unspecified: Secondary | ICD-10-CM | POA: Diagnosis not present

## 2013-07-27 DIAGNOSIS — E041 Nontoxic single thyroid nodule: Secondary | ICD-10-CM

## 2013-08-19 ENCOUNTER — Other Ambulatory Visit: Payer: Self-pay

## 2013-08-19 NOTE — Telephone Encounter (Signed)
Please print them all out for 90 days with a year of refills and I will sign them

## 2013-08-19 NOTE — Telephone Encounter (Signed)
Pt request written rx ASA, zyrtec,benadryl, mucinex,fish oil and colace; meds are OTC but helps pt get credit toward pts rent. Pt request cb when ready for pick up.

## 2013-08-20 ENCOUNTER — Ambulatory Visit (HOSPITAL_COMMUNITY): Payer: Medicare Other | Attending: Cardiovascular Disease

## 2013-08-20 DIAGNOSIS — I2510 Atherosclerotic heart disease of native coronary artery without angina pectoris: Secondary | ICD-10-CM | POA: Insufficient documentation

## 2013-08-20 DIAGNOSIS — I10 Essential (primary) hypertension: Secondary | ICD-10-CM | POA: Diagnosis not present

## 2013-08-20 DIAGNOSIS — F17200 Nicotine dependence, unspecified, uncomplicated: Secondary | ICD-10-CM | POA: Insufficient documentation

## 2013-08-20 DIAGNOSIS — R51 Headache: Secondary | ICD-10-CM | POA: Diagnosis not present

## 2013-08-20 DIAGNOSIS — I6529 Occlusion and stenosis of unspecified carotid artery: Secondary | ICD-10-CM | POA: Insufficient documentation

## 2013-08-20 DIAGNOSIS — Z951 Presence of aortocoronary bypass graft: Secondary | ICD-10-CM | POA: Insufficient documentation

## 2013-08-20 DIAGNOSIS — E785 Hyperlipidemia, unspecified: Secondary | ICD-10-CM | POA: Insufficient documentation

## 2013-08-20 DIAGNOSIS — Z8673 Personal history of transient ischemic attack (TIA), and cerebral infarction without residual deficits: Secondary | ICD-10-CM | POA: Diagnosis not present

## 2013-08-20 DIAGNOSIS — J449 Chronic obstructive pulmonary disease, unspecified: Secondary | ICD-10-CM | POA: Insufficient documentation

## 2013-08-20 DIAGNOSIS — E119 Type 2 diabetes mellitus without complications: Secondary | ICD-10-CM | POA: Insufficient documentation

## 2013-08-20 DIAGNOSIS — I658 Occlusion and stenosis of other precerebral arteries: Secondary | ICD-10-CM | POA: Diagnosis not present

## 2013-08-20 DIAGNOSIS — R0989 Other specified symptoms and signs involving the circulatory and respiratory systems: Secondary | ICD-10-CM | POA: Insufficient documentation

## 2013-08-20 DIAGNOSIS — J4489 Other specified chronic obstructive pulmonary disease: Secondary | ICD-10-CM | POA: Insufficient documentation

## 2013-08-20 MED ORDER — ASPIRIN 325 MG PO TABS: 325.0000 mg | ORAL_TABLET | Freq: Every day | ORAL | Status: DC

## 2013-08-20 MED ORDER — GUAIFENESIN ER 600 MG PO TB12: 1200.0000 mg | ORAL_TABLET | Freq: Two times a day (BID) | ORAL | Status: DC

## 2013-08-20 MED ORDER — DIPHENHYDRAMINE HCL 25 MG PO CAPS: ORAL_CAPSULE | ORAL | Status: DC

## 2013-08-20 MED ORDER — CVS NATURAL FISH OIL 1200 MG PO CAPS: 1.0000 | ORAL_CAPSULE | Freq: Two times a day (BID) | ORAL | Status: DC

## 2013-08-20 MED ORDER — DOCUSATE SODIUM 100 MG PO CAPS: 100.0000 mg | ORAL_CAPSULE | Freq: Two times a day (BID) | ORAL | Status: AC

## 2013-08-20 MED ORDER — CETIRIZINE HCL 10 MG PO TABS: 10.0000 mg | ORAL_TABLET | Freq: Every day | ORAL | Status: DC

## 2013-08-20 NOTE — Telephone Encounter (Signed)
Rxs printed and placed in your inbox for signing when you get back on Friday

## 2013-08-24 ENCOUNTER — Telehealth: Payer: Self-pay | Admitting: Family Medicine

## 2013-08-24 NOTE — Telephone Encounter (Signed)
Patient brought in a Disability Parking Placard form to be filled out.  It's in your incoming box.

## 2013-08-24 NOTE — Telephone Encounter (Signed)
Pt notified Rxs ready for pick-up 

## 2013-08-24 NOTE — Telephone Encounter (Signed)
I do not see it anywhere.

## 2013-08-25 NOTE — Telephone Encounter (Signed)
Dr. Milinda Antis placed form in my inbox, pt notified form ready for pick-up

## 2013-08-26 ENCOUNTER — Telehealth: Payer: Self-pay

## 2013-08-26 DIAGNOSIS — I779 Disorder of arteries and arterioles, unspecified: Secondary | ICD-10-CM

## 2013-08-26 MED ORDER — LOSARTAN POTASSIUM 100 MG PO TABS: 100.0000 mg | ORAL_TABLET | Freq: Every day | ORAL | Status: DC

## 2013-08-26 MED ORDER — ISOSORBIDE MONONITRATE ER 30 MG PO TB24: 30.0000 mg | ORAL_TABLET | Freq: Two times a day (BID) | ORAL | Status: DC

## 2013-08-26 NOTE — Telephone Encounter (Signed)
Spoke to patient carotid doppler results given.Repeat in 6 months.Patient stated she never got a call to schedule appointment to see Dr.Jordan,last office visit 03/19/12 and she needs refills on isosorbide,losartan.Appointment scheduled with Dr.Jordan 10/29/13 at 12:00 pm.Refills sent to pharmacy.

## 2013-09-14 ENCOUNTER — Telehealth: Payer: Self-pay | Admitting: Neurology

## 2013-09-14 ENCOUNTER — Other Ambulatory Visit: Payer: Self-pay | Admitting: Neurology

## 2013-09-14 MED ORDER — CHOLECALCIFEROL 25 MCG (1000 UT) PO CAPS: 1000.0000 [IU] | ORAL_CAPSULE | Freq: Every day | ORAL | Status: DC

## 2013-09-14 MED ORDER — CALCIUM 1500 MG PO TABS: 1500.0000 mg | ORAL_TABLET | Freq: Every day | ORAL | Status: DC

## 2013-09-14 NOTE — Telephone Encounter (Signed)
Returned a call from the patient. 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  She has the letter that I mailed to her back in October but management is requiring a script. She will come by the office to pick up.

## 2013-09-14 NOTE — Telephone Encounter (Signed)
Will come by the office to get scripts as requested.

## 2013-09-16 ENCOUNTER — Encounter: Payer: Self-pay | Admitting: Neurology

## 2013-09-16 ENCOUNTER — Ambulatory Visit (INDEPENDENT_AMBULATORY_CARE_PROVIDER_SITE_OTHER): Payer: Medicare Other | Admitting: Neurology

## 2013-09-16 VITALS — BP 120/70 | HR 86 | Temp 97.6°F | Ht <= 58 in | Wt 156.0 lb

## 2013-09-16 DIAGNOSIS — G40209 Localization-related (focal) (partial) symptomatic epilepsy and epileptic syndromes with complex partial seizures, not intractable, without status epilepticus: Secondary | ICD-10-CM

## 2013-09-16 NOTE — Progress Notes (Signed)
NEUROLOGY FOLLOW UP OFFICE NOTE  WYNNIE PACETTI [id number]  HISTORY OF PRESENT ILLNESS: Diane [name] is a 57 year old right-handed woman with history of CAD s/p CABG, hypertension, hyperlipidemia, type II diabetes mellitus, tobacco use, stroke, depression, fibromyalgia, migraine and COPD who follows up for seizures.  Records and images were personally reviewed where available.    She was admitted to [facility] from 03/03/13 to 03/06/13 for recurrent seizure-like episodes.  These spells are were witnessed by Dr. Noel Christmas, neuro-hospitalist, and described as "head turning to the right as well as eye deviation to the right, stiffening of right extremities with flexion of right upper extremity, and convulsive movements of right extremities. Spell lasted about 40-50 seconds and resolved without intervention. Within 30 seconds patient was again awake and conversant. She was slightly confused with respect to time."  She is unable to speak during these events.  Sometimes she was aware during these events and other times she was unaware and amnestic.  She did have urinary incontinence but no tongue biting.  It is reported that she can write down what she wants to say during these spells.  Given her stroke history, she was evaluatedfor possible left hemispheric stroke.  By his assessment, it seemed that her speech difficulties were non-organic, seizure-like activity non-organic, and he did not suspect acute stroke.    She had continuous EEG monitoring, which captured multiple spells without electrographic correlate, however there are no details describing these events captured in the hospital notes.   However, she did have one captured clinical event, described as "build up to rhythmical theta activity and resultant slowing companied by clinical changes.  This may very well represent epileptiform activity.  No interictal abnormalities were noted".  Psychiatry was consulted, regarding possible  pseudoseizures.  Despite the possible diagnosis of pseudoseizures, the neuro-hospitalists never made a sole diagnosis of pseudoseizures in the hospital notes.    MRI of brain performed on 03/03/13, which reported possible tiny acute infarct in the posterior left frontal lobe, but neurology believed this to be artifact.  There was also mention of incidental tiny cysts in right hippocampal region.  Encephalomalacia in the right frontoparietal region, from prior remote stroke, is seen.    I personally reviewed the EEG from June.  At 1:57:48, patient pauses, head slightly turned left, staring, with little movement and no convulsions.  She toes pull at the covers a little, but no appreciable stereotypical hand movements.  When it is over, she begins moving around again.  Electrographically, she does have a build up of generalized theta activity up until 1:58:26, followed by generalized delta slowing lasting until 1:58:45.  Difficult to lateralize, but may be more prominent in the left hemisphere.  However, this appears to only be a clip of the event and the rest of the study is not available to me.  Therefore I cannot comment on the other captured spells.  06/09/13: Hgb A1c 9.3, TSH 2.17, LDL 98, albumin 3.4, glucose 123, WBC 8.7, Hgb 11.8, HCT 36.5, PLT 295, Na 136, K 4.0, BUN 7, Cr 0.6, AP 87, AST 13, ALT 11, Ca 9.3 03/18/13: phenytoin level 11. DEXA scan (07/03/13): osteopenia noted.  She was initially on Keppra 750mg  BID alone, but spells persisted.  Dilantin ER 100mg  TID was started and the spells stopped.  She has not had any further spells since hospital discharge on 03/18/13.  Recommended starting calcium 1500mg  and vitamin D 1000 IU daily.    Since last  visit, she has been doing fine.  No seizures.  No side effects.  The only problem is that she sometimes forgets the afternoon dose of Dilantin since she takes no other medications in the afternoon.  PAST MEDICAL HISTORY: Past Medical History  Diagnosis  Date  . Coronary artery disease     post CABG in 3/07   . Hypertension   . Hyperlipidemia   . Dyslipidemia   . Diabetes mellitus     type 2  . Chronic bronchitis   . CVA (cerebral infarction) 1993  . Tobacco abuse   . COPD (chronic obstructive pulmonary disease)   . Stroke 1993  . Seizures   . Carotid stenosis   . Systolic murmur     known mild AS and MR    MEDICATIONS: Current Outpatient Prescriptions on File Prior to Visit  Medication Sig Dispense Refill  . albuterol (PROVENTIL) (2.5 MG/3ML) 0.083% nebulizer solution Take 3 mLs (2.5 mg total) by nebulization every 6 (six) hours as needed.  360 mL  3  . aspirin 325 MG tablet Take 1 tablet (325 mg total) by mouth daily.  90 tablet  3  . Calcium 1500 MG tablet Take 1 tablet (1,500 mg total) by mouth daily.  30 tablet  0  . carvedilol (COREG) 12.5 MG tablet Take 12.5 mg by mouth 2 (two) times daily with a meal.      . cetirizine (ZYRTEC) 10 MG tablet Take 1 tablet (10 mg total) by mouth daily.  90 tablet  3  . Cholecalciferol ([employer] VITAMIN D3) 1000 UNITS capsule Take 1 capsule (1,000 Units total) by mouth daily.  30 capsule  0  . DEXILANT 60 MG capsule TAKE 1 CAPSULE (60 MG TOTAL) BY MOUTH DAILY.  30 capsule  5  . diphenhydrAMINE (BENADRYL) 25 mg capsule Take 2 capsules in AM and 1 capsule at night  270 capsule  3  . docusate sodium (COLACE) 100 MG capsule Take 1 capsule (100 mg total) by mouth 2 (two) times daily.  180 capsule  3  . guaiFENesin (MUCINEX) 600 MG 12 hr tablet Take 2 tablets (1,200 mg total) by mouth 2 (two) times daily.  360 tablet  3  . hydrochlorothiazide (HYDRODIURIL) 50 MG tablet Take 50 mg by mouth daily.      . insulin aspart (NOVOLOG) 100 UNIT/ML injection Inject into the skin 3 (three) times daily with meals. Sliding scale      . insulin detemir (LEVEMIR) 100 UNIT/ML injection Inject into the skin at bedtime. Take up to 50 uints at night      . isosorbide mononitrate (IMDUR) 30 MG 24 hr tablet Take 1 tablet (30  mg total) by mouth 2 (two) times daily.  60 tablet  3  . levETIRAcetam (KEPPRA) 750 MG tablet Take 1 tablet (750 mg total) by mouth 2 (two) times daily.  60 tablet  11  . LORazepam (ATIVAN) 0.5 MG tablet Take 0.25 mg by mouth every 6 (six) hours as needed for anxiety.      [name] losartan (COZAAR) 100 MG tablet Take 1 tablet (100 mg total) by mouth daily.  30 tablet  3  . nitroGLYCERIN (NITROSTAT) 0.4 MG SL tablet Place 1 tablet (0.4 mg total) under the tongue every 5 (five) minutes as needed.  25 tablet  11  . Omega-3 Fatty Acids ([employer]S NATURAL FISH OIL) 1200 MG CAPS Take 1 capsule by mouth 2 (two) times daily.  180 capsule  3  . phenytoin (DILANTIN) 100  MG ER capsule Take 1 capsule (100 mg total) by mouth 3 (three) times daily.  90 capsule  11  . potassium chloride (K-DUR) 10 MEQ tablet Take 10 mEq by mouth 2 (two) times daily.      . ranolazine (RANEXA) 500 MG 12 hr tablet Take 500 mg by mouth 2 (two) times daily.      . rosuvastatin (CRESTOR) 10 MG tablet Take 10 mg by mouth daily.       No current facility-administered medications on file prior to visit.    ALLERGIES: Allergies  Allergen Reactions  . Ace Inhibitors     REACTION: cough  . Bee Venom   . Eggs Or Egg-Derived Products     Abdominal discomfort-no allergy She can get a flu shot   . Tetracycline     REACTION: reaction not known    FAMILY HISTORY: Family History  Problem Relation Age of Onset  . Coronary artery disease    . Heart disease    . Heart disease Mother   . Diabetes Mother   . COPD Mother   . Hyperlipidemia Mother   . Hypertension Mother   . Cancer Father   . Drug abuse Paternal Grandmother   . Stroke Paternal Grandfather     SOCIAL HISTORY: History   Social History  . Marital Status: Divorced    Spouse Name: N/A    Number of Children: N/A  . Years of Education: N/A   Occupational History  . Not on file.   Social History Main Topics  . Smoking status: Current Some Day Smoker -- 1.00 packs/day for  36 years    Types: Cigarettes  . Smokeless tobacco: Never Used     Comment: 1.5-2 ppd  . Alcohol Use: No  . Drug Use: No  . Sexual Activity: Not Currently    Partners: Male    Birth Control/ Protection: None   Other Topics Concern  . Not on file   Social History Narrative  . No narrative on file    REVIEW OF SYSTEMS: Constitutional: No fevers, chills, or sweats, no generalized fatigue, change in appetite Eyes: No visual changes, double vision, eye pain Ear, nose and throat: No hearing loss, ear pain, nasal congestion, sore throat Cardiovascular: No chest pain, palpitations Respiratory:  No shortness of breath at rest or with exertion, wheezes GastrointestinaI: No nausea, vomiting, diarrhea, abdominal pain, fecal incontinence Genitourinary:  No dysuria, urinary retention or frequency Musculoskeletal:  No neck pain, back pain Integumentary: No rash, pruritus, skin lesions Neurological: as above Psychiatric: Some problems sleeping. Endocrine: No palpitations, fatigue, diaphoresis, mood swings, change in appetite, change in weight, increased thirst Hematologic/Lymphatic:  No anemia, purpura, petechiae. Allergic/Immunologic: no itchy/runny eyes, nasal congestion, recent allergic reactions, rashes  PHYSICAL EXAM: Filed Vitals:   09/16/13 1321  BP: 120/70  Pulse: 86  Temp: 97.6 F (36.4 C)   General: No acute distress Head:  Normocephalic/atraumatic Neck: supple, no paraspinal tenderness, full range of motion Heart:  Regular rate and rhythm Lungs:  Clear to auscultation bilaterally Back: No paraspinal tenderness Neurological Exam: alert and oriented to person, place, and time. Speech fluent and not dysarthric, language intact.  CN II-XII intact..  Bulk and tone normal, muscle strength 5/5 throughout.  Sensation to light touch intact.  Deep tendon reflexes 2+ throughout.  Finger to nose intact.  Gait normal.  IMPRESSION: Complex partial seizures, probably focus from prior  stroke  PLAN: 1.  Take Dilantin ER 100mg  every morning and 200mg  at bedtime (  so she does not have to remember to take an afternoon dose) 2.  Continue Keppra 750mg  twice daily 3.  Continue calcium 1500mg  and vitamin D 1000 IU daily 4.  Tomorrow she may resume driving (pending she does not have another seizure until then) as it would be 6 months since her last seizure. 5.  Follow up in 6 months. 6.  Smoking cessation.  Shon Millet, DO  CC:  [name], MD

## 2013-09-16 NOTE — Patient Instructions (Addendum)
1.  Since you sometimes forget the afternoon dose, take the Dilantin ER 100mg  1pill in morning and 2 pills at bedtime. 2.  Continue Keppra 750mg  twice daily. 3.  Continue calcium 1500mg  and vitamin D 1000 IU daily 4.  Tomorrow you may resume driving (pending you do not have another seizure until then) as it would be 6 months since your last seizure. 5.  Follow up in 6 months. 6.  Stop smoking.

## 2013-09-28 ENCOUNTER — Other Ambulatory Visit: Payer: Self-pay | Admitting: Cardiology

## 2013-10-20 ENCOUNTER — Encounter: Payer: Self-pay | Admitting: Cardiology

## 2013-10-22 DIAGNOSIS — E119 Type 2 diabetes mellitus without complications: Secondary | ICD-10-CM | POA: Diagnosis not present

## 2013-10-22 DIAGNOSIS — I10 Essential (primary) hypertension: Secondary | ICD-10-CM | POA: Diagnosis not present

## 2013-10-22 DIAGNOSIS — E7800 Pure hypercholesterolemia, unspecified: Secondary | ICD-10-CM | POA: Diagnosis not present

## 2013-10-22 DIAGNOSIS — E041 Nontoxic single thyroid nodule: Secondary | ICD-10-CM | POA: Diagnosis not present

## 2013-10-29 ENCOUNTER — Encounter: Payer: Self-pay | Admitting: Cardiology

## 2013-10-29 ENCOUNTER — Ambulatory Visit (INDEPENDENT_AMBULATORY_CARE_PROVIDER_SITE_OTHER): Payer: Medicare Other | Admitting: Cardiology

## 2013-10-29 VITALS — BP 114/64 | HR 93 | Ht <= 58 in | Wt 156.6 lb

## 2013-10-29 DIAGNOSIS — I2510 Atherosclerotic heart disease of native coronary artery without angina pectoris: Secondary | ICD-10-CM | POA: Diagnosis not present

## 2013-10-29 DIAGNOSIS — F17200 Nicotine dependence, unspecified, uncomplicated: Secondary | ICD-10-CM

## 2013-10-29 DIAGNOSIS — E119 Type 2 diabetes mellitus without complications: Secondary | ICD-10-CM | POA: Diagnosis not present

## 2013-10-29 DIAGNOSIS — I739 Peripheral vascular disease, unspecified: Principal | ICD-10-CM

## 2013-10-29 DIAGNOSIS — I779 Disorder of arteries and arterioles, unspecified: Secondary | ICD-10-CM | POA: Diagnosis not present

## 2013-10-29 DIAGNOSIS — E785 Hyperlipidemia, unspecified: Secondary | ICD-10-CM | POA: Diagnosis not present

## 2013-10-29 MED ORDER — ROSUVASTATIN CALCIUM 10 MG PO TABS: 10.0000 mg | ORAL_TABLET | Freq: Every day | ORAL | Status: DC

## 2013-10-29 MED ORDER — CARVEDILOL 12.5 MG PO TABS: 12.5000 mg | ORAL_TABLET | Freq: Two times a day (BID) | ORAL | Status: DC

## 2013-10-29 MED ORDER — ISOSORBIDE MONONITRATE ER 30 MG PO TB24: 30.0000 mg | ORAL_TABLET | Freq: Two times a day (BID) | ORAL | Status: DC

## 2013-10-29 MED ORDER — LOSARTAN POTASSIUM 100 MG PO TABS: 100.0000 mg | ORAL_TABLET | Freq: Every day | ORAL | Status: DC

## 2013-10-29 MED ORDER — NITROGLYCERIN 0.4 MG SL SUBL: 0.4000 mg | SUBLINGUAL_TABLET | SUBLINGUAL | Status: DC | PRN

## 2013-10-29 MED ORDER — POTASSIUM CHLORIDE ER 10 MEQ PO TBCR: 10.0000 meq | EXTENDED_RELEASE_TABLET | Freq: Two times a day (BID) | ORAL | Status: DC

## 2013-10-29 NOTE — Progress Notes (Signed)
Diane [name] Date of Birth: [birth year] Medical Record #[id number]  History of Present Illness: Diane [name] is seen today for followup. She has a history of coronary disease and is status post CABG in March of 2007. She's also had stenting of the mid left circumflex coronary and the first obtuse marginal vessel later the same year. The vein graft to the obtuse marginal was occluded at that time. She has multiple cardiac risk factors including diabetes, dyslipidemia, hypertension, and tobacco abuse. She also has moderate bilateral carotid arterial disease. Recent Dopplers were unchanged.  She continues to smoke 2 packs per day. Her last cardiac evaluation with a nuclear stress test in November 2012 showed that the inferior wall was obscured by uptake of the tracer in the bowel. There is no definite ischemia. Ejection fraction was reduced. However an echocardiogram she had normal vigorous LV function.  Overall she feels she is stable from a cardiac standpoint. She gets chest pain if she tries to walk to fast. Pain is midsternal radiating to the shoulders and jaw. Relieved with sl Ntg x 1-2. She was admitted to the hospital in June 2014 with apparent seizures. She is now on Keppra and dilantin.  Current Outpatient Prescriptions on File Prior to Visit  Medication Sig Dispense Refill  . albuterol (PROVENTIL) (2.5 MG/3ML) 0.083% nebulizer solution Take 3 mLs (2.5 mg total) by nebulization every 6 (six) hours as needed.  360 mL  3  . aspirin 325 MG tablet Take 1 tablet (325 mg total) by mouth daily.  90 tablet  3  . Calcium 1500 MG tablet Take 1 tablet (1,500 mg total) by mouth daily.  30 tablet  0  . cetirizine (ZYRTEC) 10 MG tablet Take 1 tablet (10 mg total) by mouth daily.  90 tablet  3  . Cholecalciferol ([employer]S VITAMIN D3) 1000 UNITS capsule Take 1 capsule (1,000 Units total) by mouth daily.  30 capsule  0  . DEXILANT 60 MG capsule TAKE 1 CAPSULE (60 MG TOTAL) BY MOUTH DAILY.  30 capsule  5  .  diphenhydrAMINE (BENADRYL) 25 mg capsule Take 2 capsules in AM and 1 capsule at night  270 capsule  3  . docusate sodium (COLACE) 100 MG capsule Take 1 capsule (100 mg total) by mouth 2 (two) times daily.  180 capsule  3  . guaiFENesin (MUCINEX) 600 MG 12 hr tablet Take 2 tablets (1,200 mg total) by mouth 2 (two) times daily.  360 tablet  3  . hydrochlorothiazide (HYDRODIURIL) 50 MG tablet Take 50 mg by mouth daily.      . insulin aspart (NOVOLOG) 100 UNIT/ML injection Inject into the skin 3 (three) times daily with meals. Sliding scale      . insulin detemir (LEVEMIR) 100 UNIT/ML injection Inject into the skin at bedtime. Take 20 units in the morning and 60 units before bed      . levETIRAcetam (KEPPRA) 750 MG tablet Take 1 tablet (750 mg total) by mouth 2 (two) times daily.  60 tablet  11  . LORazepam (ATIVAN) 0.5 MG tablet Take 0.25 mg by mouth every 6 (six) hours as needed for anxiety.      . Omega-3 Fatty Acids ([employer]S NATURAL FISH OIL) 1200 MG CAPS Take 1 capsule by mouth 2 (two) times daily.  180 capsule  3  . phenytoin (DILANTIN) 100 MG ER capsule Take 1 capsule (100 mg total) by mouth 3 (three) times daily.  90 capsule  11  . ranolazine (RANEXA) 500 MG  12 hr tablet Take 500 mg by mouth 2 (two) times daily.       No current facility-administered medications on file prior to visit.    Allergies  Allergen Reactions  . Ace Inhibitors     REACTION: cough  . Bee Venom   . Eggs Or Egg-Derived Products     Abdominal discomfort-no allergy She can get a flu shot   . Tetracycline     REACTION: reaction not known    Past Medical History  Diagnosis Date  . Coronary artery disease     post CABG in 3/07   . Hypertension   . Hyperlipidemia   . Dyslipidemia   . Diabetes mellitus     type 2  . Chronic bronchitis   . CVA (cerebral infarction) 1993  . Tobacco abuse   . COPD (chronic obstructive pulmonary disease)   . Stroke 1993  . Seizures   . Carotid stenosis   . Systolic murmur      known mild AS and MR    Past Surgical History  Procedure Laterality Date  . Coronary stent placement  08/11/06    PCI of her ciurcumflex/OM vessel  . Cardiac catheterization  11/29/05    EF of 55%  . Cardiac catheterization  08/06/06    EF of 45-50%  . Coronary artery bypass graft  12/04/2005    x5 -- left internal mammary artery to the LAD, left radial artery to the ramus intermedius, saphenous vein graft to the obtuse marginal 1, sequential saphenous vein grat to the acute marginal and posterior descending, endoscopic vein harvesting from the left thigh with open vein harvest from right leg    History  Smoking status  . Current Some Day Smoker -- 1.00 packs/day for 36 years  . Types: Cigarettes  Smokeless tobacco  . Never Used    Comment: 1.5-2 ppd    History  Alcohol Use No    Family History  Problem Relation Age of Onset  . Coronary artery disease    . Heart disease    . Heart disease Mother   . Diabetes Mother   . COPD Mother   . Hyperlipidemia Mother   . Hypertension Mother   . Cancer Father   . Drug abuse Paternal Grandmother   . Stroke Paternal Grandfather     Review of Systems: As noted in history of present illness.  All other systems were reviewed and are negative.  Physical Exam: BP 114/64  Pulse 93  Ht 4\' 10"  (1.473 m)  Wt 156 lb 9.6 oz (71.033 kg)  BMI 32.74 kg/m2  LMP 09/02/2003 She is a chronically ill-appearing white female in no acute distress. She is normocephalic, atraumatic. Pupils are equal round and reactive. Sclera clear. Oropharynx is clear. Neck reveals bilateral carotid bruits. There is no JVD. Lungs are clear. Cardiac exam reveals a grade 6-0/6 systolic murmur at the right upper sternal border. Abdomen is soft nontender without masses or bruits. She has no edema. Pedal pulses are good. LABORATORY DATA:  Recent carotid Doppler studies demonstrated 50-79% stenosis bilaterally. This is unchanged from prior studies.  Assessment / Plan: 1.  CAD S/p CABG. S/p PCIs. Stable class 2 angina. Continue Rx. With ASA, plavix, nitrates, coreg, and Ranexa. Stressed importance of smoking cessation. Will follow up in one year and plan Myoview study at that time. She is to call if her anginal symptoms increase. 2. Carotid arterial disese- moderate, bilateral. Recent carotids stable. Repeat 6 months. 3. Tobacco abuse. Is  going to try nicotine patches to try and quit. 4. HTN controlled.

## 2013-10-29 NOTE — Patient Instructions (Signed)
We have refilled your medication  I encourage you to quit smoking. I will see you in one year.

## 2013-11-02 ENCOUNTER — Other Ambulatory Visit: Payer: Self-pay | Admitting: *Deleted

## 2013-11-02 MED ORDER — HYDROCHLOROTHIAZIDE 50 MG PO TABS: 50.0000 mg | ORAL_TABLET | Freq: Every day | ORAL | Status: DC

## 2013-11-02 NOTE — Telephone Encounter (Signed)
Ok to fill 

## 2013-11-02 NOTE — Telephone Encounter (Signed)
Please refill for 3 months

## 2013-12-14 ENCOUNTER — Ambulatory Visit (INDEPENDENT_AMBULATORY_CARE_PROVIDER_SITE_OTHER): Payer: Medicare Other | Admitting: Family Medicine

## 2013-12-14 ENCOUNTER — Encounter: Payer: Self-pay | Admitting: Family Medicine

## 2013-12-14 VITALS — BP 136/78 | HR 79 | Temp 97.9°F | Ht <= 58 in | Wt 154.2 lb

## 2013-12-14 DIAGNOSIS — F17200 Nicotine dependence, unspecified, uncomplicated: Secondary | ICD-10-CM | POA: Diagnosis not present

## 2013-12-14 DIAGNOSIS — I10 Essential (primary) hypertension: Secondary | ICD-10-CM | POA: Diagnosis not present

## 2013-12-14 DIAGNOSIS — I2510 Atherosclerotic heart disease of native coronary artery without angina pectoris: Secondary | ICD-10-CM

## 2013-12-14 DIAGNOSIS — E785 Hyperlipidemia, unspecified: Secondary | ICD-10-CM

## 2013-12-14 LAB — CBC WITH DIFFERENTIAL/PLATELET
Basophils Absolute: 0 10*3/uL (ref 0.0–0.1)
Basophils Relative: 0.3 % (ref 0.0–3.0)
Eosinophils Absolute: 0.1 10*3/uL (ref 0.0–0.7)
Eosinophils Relative: 1.3 % (ref 0.0–5.0)
HCT: 35.1 % — ABNORMAL LOW (ref 36.0–46.0)
Hemoglobin: 10.7 g/dL — ABNORMAL LOW (ref 12.0–15.0)
Lymphocytes Relative: 25.7 % (ref 12.0–46.0)
Lymphs Abs: 1.8 10*3/uL (ref 0.7–4.0)
MCHC: 30.4 g/dL (ref 30.0–36.0)
MCV: 70.1 fl — ABNORMAL LOW (ref 78.0–100.0)
Monocytes Absolute: 0.7 10*3/uL (ref 0.1–1.0)
Monocytes Relative: 9.4 % (ref 3.0–12.0)
Neutro Abs: 4.5 10*3/uL (ref 1.4–7.7)
Neutrophils Relative %: 63.3 % (ref 43.0–77.0)
Platelets: 295 10*3/uL (ref 150.0–400.0)
RBC: 5 Mil/uL (ref 3.87–5.11)
RDW: 18.9 % — ABNORMAL HIGH (ref 11.5–14.6)
WBC: 7.2 10*3/uL (ref 4.5–10.5)

## 2013-12-14 LAB — LIPID PANEL
Cholesterol: 137 mg/dL (ref 0–200)
HDL: 41.9 mg/dL (ref >39.00)
LDL Cholesterol: 80 mg/dL (ref 0–99)
Total CHOL/HDL Ratio: 3
Triglycerides: 75 mg/dL (ref 0.0–149.0)
VLDL: 15 mg/dL (ref 0.0–40.0)

## 2013-12-14 LAB — COMPREHENSIVE METABOLIC PANEL
ALT: 14 U/L (ref 0–35)
AST: 12 U/L (ref 0–37)
Albumin: 3.6 g/dL (ref 3.5–5.2)
Alkaline Phosphatase: 78 U/L (ref 39–117)
BUN: 13 mg/dL (ref 6–23)
CO2: 27 mEq/L (ref 19–32)
Calcium: 9.2 mg/dL (ref 8.4–10.5)
Chloride: 103 mEq/L (ref 96–112)
Creatinine, Ser: 0.7 mg/dL (ref 0.4–1.2)
GFR: 97.17 mL/min (ref >60.00)
Glucose, Bld: 96 mg/dL (ref 70–99)
Potassium: 4 mEq/L (ref 3.5–5.1)
Sodium: 138 mEq/L (ref 135–145)
Total Bilirubin: 0.3 mg/dL (ref 0.3–1.2)
Total Protein: 6.6 g/dL (ref 6.0–8.3)

## 2013-12-14 NOTE — Assessment & Plan Note (Signed)
Disc in detail risks of smoking and possible outcomes including copd, vascular/ heart disease, cancer , respiratory and sinus infections  Pt voices understanding  Not ready yet but planning to quit once she has moved Disc strategies

## 2013-12-14 NOTE — Progress Notes (Signed)
Subjective:    Patient ID: Diane [name], female    DOB: [birth year], 58 y.o.   MRN: [id number]  HPI Here for f/u of chronic medical problems   Doing well overall   Moved in with her mother - that has been a little hard -taking care of each other- and closer to family More help from everybody   bp is stable today  No cp or palpitations or headaches or edema  No side effects to medicines  BP Readings from Last 3 Encounters:  12/14/13 136/78  10/29/13 114/64  09/16/13 120/70     Wt is down 2 lb with bmi of 32   cardilogy note reviewed- stable , watching CAD and carotid artery stenosis   Sees Dr Chalmers Cater - she inc her levemir - 60 units pm and 30 am  Doing better  Thinks A1C was about 7.5  Improving   Smoking - she has cut back some 1 1/2 pack - since she has to go outside to smoke Thinks she will be able to quit after her move is complete Has patches and also lorazepam to help  Breathing -not as good as it used to be/ she is overdue to see pulm/ DR [facility]   Lab Results  Component Value Date   CHOL 163 06/09/2013   HDL 38.40* 06/09/2013   LDLCALC 46 06/13/2011   LDLDIRECT 98.0 06/09/2013   TRIG 251.0* 06/09/2013   CHOLHDL 4 06/09/2013   diet is good overall - better than it has been    Patient Active Problem List   Diagnosis Date Noted  . Complex partial seizures 09/16/2013  . Encounter for Medicare annual wellness exam 06/16/2013  . Routine general medical examination at a health care facility 06/08/2013  . Seizures 03/25/2013  . Goiter 09/01/2012  . Other screening mammogram 06/13/2012  . Allergic rhinitis 06/13/2012  . Chronic bronchitis 04/02/2012  . Hyponatremia 12/11/2011  . Carotid artery disease 08/20/2011  . Other dysphagia 09/14/2010  . DIZZINESS 07/26/2010  . OBSTRUCTIVE SLEEP APNEA 09/18/2007  . TOBACCO USE 08/08/2007  . DIABETES MELLITUS, TYPE II 03/26/2007  . HYPERLIPIDEMIA 03/26/2007  . ANXIETY 03/26/2007  . DEPRESSION 03/26/2007  . MIGRAINE HEADACHE  03/26/2007  . CARPAL TUNNEL SYNDROME, BILATERAL 03/26/2007  . HYPERTENSION 03/26/2007  . CORONARY ARTERY DISEASE 03/26/2007  . CVA 03/26/2007  . GERD 03/26/2007  . FIBROMYALGIA 03/26/2007   Past Medical History  Diagnosis Date  . Coronary artery disease     post CABG in 3/07   . Hypertension   . Hyperlipidemia   . Dyslipidemia   . Diabetes mellitus     type 2  . Chronic bronchitis   . CVA (cerebral infarction) 1993  . Tobacco abuse   . COPD (chronic obstructive pulmonary disease)   . Stroke 1993  . Seizures   . Carotid stenosis   . Systolic murmur     known mild AS and MR   Past Surgical History  Procedure Laterality Date  . Coronary stent placement  08/11/06    PCI of her ciurcumflex/OM vessel  . Cardiac catheterization  11/29/05    EF of 55%  . Cardiac catheterization  08/06/06    EF of 45-50%  . Coronary artery bypass graft  12/04/2005    x5 -- left internal mammary artery to the LAD, left radial artery to the ramus intermedius, saphenous vein graft to the obtuse marginal 1, sequential saphenous vein grat to the acute marginal and posterior descending, endoscopic vein harvesting  from the left thigh with open vein harvest from right leg   History  Substance Use Topics  . Smoking status: Current Every Day Smoker -- 1.50 packs/day for 36 years    Types: Cigarettes  . Smokeless tobacco: Never Used     Comment: 1.5-2 ppd  . Alcohol Use: No   Family History  Problem Relation Age of Onset  . Coronary artery disease    . Heart disease    . Heart disease Mother   . Diabetes Mother   . COPD Mother   . Hyperlipidemia Mother   . Hypertension Mother   . Cancer Father   . Drug abuse Paternal Grandmother   . Stroke Paternal Grandfather    Allergies  Allergen Reactions  . Ace Inhibitors     REACTION: cough  . Bee Venom   . Eggs Or Egg-Derived Products     Abdominal discomfort-no allergy She can get a flu shot   . Tetracycline     REACTION: reaction not known    Current Outpatient Prescriptions on File Prior to Visit  Medication Sig Dispense Refill  . albuterol (PROVENTIL) (2.5 MG/3ML) 0.083% nebulizer solution Take 3 mLs (2.5 mg total) by nebulization every 6 (six) hours as needed.  360 mL  3  . aspirin 325 MG tablet Take 1 tablet (325 mg total) by mouth daily.  90 tablet  3  . B-D ULTRAFINE III SHORT PEN 31G X 8 MM MISC       . Calcium 1500 MG tablet Take 1 tablet (1,500 mg total) by mouth daily.  30 tablet  0  . carvedilol (COREG) 12.5 MG tablet Take 1 tablet (12.5 mg total) by mouth 2 (two) times daily with a meal.  60 tablet  11  . cetirizine (ZYRTEC) 10 MG tablet Take 1 tablet (10 mg total) by mouth daily.  90 tablet  3  . Cholecalciferol ([employer]S VITAMIN D3) 1000 UNITS capsule Take 1 capsule (1,000 Units total) by mouth daily.  30 capsule  0  . DEXILANT 60 MG capsule TAKE 1 CAPSULE (60 MG TOTAL) BY MOUTH DAILY.  30 capsule  5  . diphenhydrAMINE (BENADRYL) 25 mg capsule Take 2 capsules in AM and 1 capsule at night  270 capsule  3  . docusate sodium (COLACE) 100 MG capsule Take 1 capsule (100 mg total) by mouth 2 (two) times daily.  180 capsule  3  . DULoxetine (CYMBALTA) 60 MG capsule       . guaiFENesin (MUCINEX) 600 MG 12 hr tablet Take 2 tablets (1,200 mg total) by mouth 2 (two) times daily.  360 tablet  3  . hydrochlorothiazide (HYDRODIURIL) 50 MG tablet Take 1 tablet (50 mg total) by mouth daily.  30 tablet  2  . insulin aspart (NOVOLOG) 100 UNIT/ML injection Inject into the skin 3 (three) times daily with meals. Sliding scale      . insulin detemir (LEVEMIR) 100 UNIT/ML injection Inject into the skin at bedtime. Take 20 units in the morning and 60 units before bed      . isosorbide mononitrate (IMDUR) 30 MG 24 hr tablet Take 1 tablet (30 mg total) by mouth 2 (two) times daily.  60 tablet  3  . levETIRAcetam (KEPPRA) 750 MG tablet Take 1 tablet (750 mg total) by mouth 2 (two) times daily.  60 tablet  11  . LORazepam (ATIVAN) 0.5 MG tablet Take  0.25 mg by mouth every 6 (six) hours as needed for anxiety.      [name]  losartan (COZAAR) 100 MG tablet Take 1 tablet (100 mg total) by mouth daily.  30 tablet  3  . nitroGLYCERIN (NITROSTAT) 0.4 MG SL tablet Place 1 tablet (0.4 mg total) under the tongue every 5 (five) minutes as needed.  25 tablet  11  . Omega-3 Fatty Acids ([employer]S NATURAL FISH OIL) 1200 MG CAPS Take 1 capsule by mouth 2 (two) times daily.  180 capsule  3  . phenytoin (DILANTIN) 100 MG ER capsule Take 1 capsule (100 mg total) by mouth 3 (three) times daily.  90 capsule  11  . potassium chloride (K-DUR) 10 MEQ tablet Take 1 tablet (10 mEq total) by mouth 2 (two) times daily.  60 tablet  11  . ranolazine (RANEXA) 500 MG 12 hr tablet Take 500 mg by mouth 2 (two) times daily.      . rosuvastatin (CRESTOR) 10 MG tablet Take 1 tablet (10 mg total) by mouth daily.  60 tablet  11   No current facility-administered medications on file prior to visit.    Review of Systems Review of Systems  Constitutional: Negative for fever, appetite change, fatigue and unexpected weight change.  Eyes: Negative for pain and visual disturbance.  Respiratory: Negative for cough and pos for baseline shortness of breath.   Cardiovascular: Negative for cp or palpitations    Gastrointestinal: Negative for nausea, diarrhea and constipation.  Genitourinary: Negative for urgency and frequency. neg for excessive thirst and urination  Skin: Negative for pallor or rash   Neurological: Negative for weakness, light-headedness, numbness and headaches.  Hematological: Negative for adenopathy. Does not bruise/bleed easily.  Psychiatric/Behavioral: Negative for dysphoric mood. The patient is not nervous/anxious.         Objective:   Physical Exam  Constitutional: She appears well-developed and well-nourished. No distress.  obese and well appearing   HENT:  Head: Normocephalic and atraumatic.  Mouth/Throat: Oropharynx is clear and moist.  Eyes: Conjunctivae and EOM  are normal. Pupils are equal, round, and reactive to light. Right eye exhibits no discharge. Left eye exhibits no discharge. No scleral icterus.  Neck: Normal range of motion. Neck supple. No JVD present. Carotid bruit is present. No thyromegaly present.  Cardiovascular: Normal rate, regular rhythm, normal heart sounds and intact distal pulses.  Exam reveals no gallop.   Pulmonary/Chest: Effort normal and breath sounds normal. No respiratory distress. She has no wheezes. She has no rales.  Abdominal: Soft. Bowel sounds are normal. She exhibits no distension and no mass. There is no tenderness.  Musculoskeletal: She exhibits no edema and no tenderness.  Lymphadenopathy:    She has no cervical adenopathy.  Neurological: She is alert. She has normal reflexes. No cranial nerve deficit. She exhibits normal muscle tone. Coordination normal.  Skin: Skin is warm and dry. No rash noted. No erythema. No pallor.  Psychiatric: She has a normal mood and affect.          Assessment & Plan:

## 2013-12-14 NOTE — Assessment & Plan Note (Signed)
Disc goals for lipids and reasons to control them Rev labs with pt from last draw Hx of vascular dz and smoking  Rev low sat fat diet in detail  Lab today Statin and diet

## 2013-12-14 NOTE — Patient Instructions (Signed)
I'm glad you are doing so well  Keep working on quitting smoking  Labs today - I will send results to Dr Lavone Nian  Don't forget to schedule your own mammogram

## 2013-12-14 NOTE — Assessment & Plan Note (Signed)
bp in fair control at this time  BP Readings from Last 1 Encounters:  12/14/13 136/78   No changes needed Disc lifstyle change with low sodium diet and exercise   Rev cardiology note

## 2013-12-14 NOTE — Progress Notes (Signed)
Pre visit review using our clinic review tool, if applicable. No additional management support is needed unless otherwise documented below in the visit note. 

## 2013-12-15 ENCOUNTER — Telehealth: Payer: Self-pay | Admitting: Family Medicine

## 2013-12-15 NOTE — Telephone Encounter (Signed)
Relevant patient education assigned to patient using Emmi. ° °

## 2013-12-23 ENCOUNTER — Other Ambulatory Visit: Payer: Self-pay | Admitting: Cardiology

## 2013-12-24 ENCOUNTER — Other Ambulatory Visit: Payer: Self-pay | Admitting: *Deleted

## 2013-12-24 MED ORDER — DEXLANSOPRAZOLE 60 MG PO CPDR: DELAYED_RELEASE_CAPSULE | ORAL | Status: DC

## 2014-01-07 ENCOUNTER — Other Ambulatory Visit: Payer: Self-pay

## 2014-01-16 ENCOUNTER — Other Ambulatory Visit: Payer: Self-pay | Admitting: Cardiology

## 2014-01-23 ENCOUNTER — Other Ambulatory Visit: Payer: Self-pay | Admitting: Family Medicine

## 2014-03-08 ENCOUNTER — Other Ambulatory Visit: Payer: Self-pay | Admitting: Endocrinology

## 2014-03-08 DIAGNOSIS — E119 Type 2 diabetes mellitus without complications: Secondary | ICD-10-CM | POA: Diagnosis not present

## 2014-03-08 DIAGNOSIS — E041 Nontoxic single thyroid nodule: Secondary | ICD-10-CM

## 2014-03-08 DIAGNOSIS — I10 Essential (primary) hypertension: Secondary | ICD-10-CM | POA: Diagnosis not present

## 2014-03-08 DIAGNOSIS — E7800 Pure hypercholesterolemia, unspecified: Secondary | ICD-10-CM | POA: Diagnosis not present

## 2014-03-17 ENCOUNTER — Encounter: Payer: Self-pay | Admitting: Neurology

## 2014-03-17 ENCOUNTER — Ambulatory Visit (INDEPENDENT_AMBULATORY_CARE_PROVIDER_SITE_OTHER): Payer: Medicare Other | Admitting: Neurology

## 2014-03-17 VITALS — BP 108/64 | HR 92 | Ht <= 58 in | Wt 151.0 lb

## 2014-03-17 DIAGNOSIS — G40209 Localization-related (focal) (partial) symptomatic epilepsy and epileptic syndromes with complex partial seizures, not intractable, without status epilepticus: Secondary | ICD-10-CM

## 2014-03-17 DIAGNOSIS — M5412 Radiculopathy, cervical region: Secondary | ICD-10-CM

## 2014-03-17 DIAGNOSIS — I2510 Atherosclerotic heart disease of native coronary artery without angina pectoris: Secondary | ICD-10-CM

## 2014-03-17 DIAGNOSIS — I679 Cerebrovascular disease, unspecified: Secondary | ICD-10-CM | POA: Diagnosis not present

## 2014-03-17 NOTE — Addendum Note (Signed)
Addended by: Ella Jubilee on: 03/17/2014 01:18 PM   Modules accepted: Orders

## 2014-03-17 NOTE — Patient Instructions (Signed)
1.  Continue Dilantin ER 100mg  twice daily and Keppra 750mg  twice daily. 2.  Continue vitamin D 1000 IU daily.  May reduce calcium to 600mg  daily 3.  Will refer to physical therapy for neck pain. 4.  Follow up in 6 months

## 2014-03-17 NOTE — Progress Notes (Signed)
NEUROLOGY FOLLOW UP OFFICE NOTE  Diane [name] [id number]  HISTORY OF PRESENT ILLNESS: Diane [name] is a 58 year old right-handed woman with history of CAD s/p CABG, hypertension, hyperlipidemia, type II diabetes mellitus, tobacco use, stroke, depression, fibromyalgia, migraine and COPD who follows up for seizures.  Records personally reviewed.    UPDATE: Medications:  Dilantin ER 100mg  AM & 100mg  HS (she was supposed to take 200mg  at bedtime), Keppra 750mg  twice daily, calcium 1500mg  daily, vitamin D 1000 IU daily  Last spell:  03/18/13.  In March, she moved and was doing some lifting.  Since April, she has right sided neck pain radiating down to the right shoulder, associated with tingling from the right ear down to the shoulder.  She takes tramadol which eases the pain.  Hot and cold compresses help too.  No numbness, pain or weakness down the arm or hand.  Given potential side effects and the fact that she has osteopenia, we discussed possibly tapering off Dilantin or switching it to another agent.  At this time, she wishes to remain on Dilantin since she is doing well.  HISTORY: She was admitted to [facility] from 03/03/13 to 03/06/13 for recurrent seizure-like episodes.  These spells are were witnessed by Dr. Wallie Char, neuro-hospitalist, and described as "head turning to the right as well as eye deviation to the right, stiffening of right extremities with flexion of right upper extremity, and convulsive movements of right extremities. Spell lasted about 40-50 seconds and resolved without intervention. Within 30 seconds patient was again awake and conversant. She was slightly confused with respect to time."  She is unable to speak during these events.  Sometimes she was aware during these events and other times she was unaware and amnestic.  She did have urinary incontinence but no tongue biting.  It is reported that she can write down what she wants to say during these spells.   Given her stroke history, she was evaluatedfor possible left hemispheric stroke.  By his assessment, it seemed that her speech difficulties were non-organic, seizure-like activity non-organic, and he did not suspect acute stroke.    She had continuous EEG monitoring, which captured multiple spells without electrographic correlate, however there are no details describing these events captured in the hospital notes.   However, she did have one captured clinical event, described as "build up to rhythmical theta activity and resultant slowing companied by clinical changes.  This may very well represent epileptiform activity.  No interictal abnormalities were noted".  Psychiatry was consulted, regarding possible pseudoseizures.  Despite the possible diagnosis of pseudoseizures, the neuro-hospitalists never made a sole diagnosis of pseudoseizures in the hospital notes.    MRI of brain performed on 03/03/13, which reported possible tiny acute infarct in the posterior left frontal lobe, but neurology believed this to be artifact.  There was also mention of incidental tiny cysts in right hippocampal region.  Encephalomalacia in the right frontoparietal region, from prior remote stroke, is seen.    I personally reviewed the EEG from June.  At 1:57:48, patient pauses, head slightly turned left, staring, with little movement and no convulsions.  She toes pull at the covers a little, but no appreciable stereotypical hand movements.  When it is over, she begins moving around again.  Electrographically, she does have a build up of generalized theta activity up until 1:58:26, followed by generalized delta slowing lasting until 1:58:45.  Difficult to lateralize, but may be more prominent in the left  hemisphere.  However, this appears to only be a clip of the event and the rest of the study is not available to me.  Therefore I cannot comment on the other captured spells.  06/09/13: Hgb A1c 9.3, TSH 2.17, LDL 98, albumin 3.4,  glucose 123, WBC 8.7, Hgb 11.8, HCT 36.5, PLT 295, Na 136, K 4.0, BUN 7, Cr 0.6, AP 87, AST 13, ALT 11, Ca 9.3 03/18/13: phenytoin level 11. DEXA scan (07/03/13): osteopenia noted.  She was initially on Keppra 750mg  BID alone, but spells persisted.  Dilantin ER 100mg  TID was started and the spells stopped.  She has not had any further spells since hospital discharge on 03/18/13.    PAST MEDICAL HISTORY: Past Medical History  Diagnosis Date  . Coronary artery disease     post CABG in 3/07   . Hypertension   . Hyperlipidemia   . Dyslipidemia   . Diabetes mellitus     type 2  . Chronic bronchitis   . CVA (cerebral infarction) 1993  . Tobacco abuse   . COPD (chronic obstructive pulmonary disease)   . Stroke 1993  . Seizures   . Carotid stenosis   . Systolic murmur     known mild AS and MR    MEDICATIONS: Current Outpatient Prescriptions on File Prior to Visit  Medication Sig Dispense Refill  . albuterol (PROVENTIL) (2.5 MG/3ML) 0.083% nebulizer solution Take 3 mLs (2.5 mg total) by nebulization every 6 (six) hours as needed.  360 mL  3  . aspirin 325 MG tablet Take 1 tablet (325 mg total) by mouth daily.  90 tablet  3  . Calcium 1500 MG tablet Take 1 tablet (1,500 mg total) by mouth daily.  30 tablet  0  . carvedilol (COREG) 12.5 MG tablet Take 1 tablet (12.5 mg total) by mouth 2 (two) times daily with a meal.  60 tablet  11  . cetirizine (ZYRTEC) 10 MG tablet Take 1 tablet (10 mg total) by mouth daily.  90 tablet  3  . Cholecalciferol ([employer]S VITAMIN D3) 1000 UNITS capsule Take 1 capsule (1,000 Units total) by mouth daily.  30 capsule  0  . dexlansoprazole (DEXILANT) 60 MG capsule TAKE 1 CAPSULE (60 MG TOTAL) BY MOUTH DAILY.  30 capsule  5  . diphenhydrAMINE (BENADRYL) 25 mg capsule Take 2 capsules in AM and 1 capsule at night  270 capsule  3  . docusate sodium (COLACE) 100 MG capsule Take 1 capsule (100 mg total) by mouth 2 (two) times daily.  180 capsule  3  . DULoxetine (CYMBALTA) 60  MG capsule       . guaiFENesin (MUCINEX) 600 MG 12 hr tablet Take 2 tablets (1,200 mg total) by mouth 2 (two) times daily.  360 tablet  3  . hydrochlorothiazide (HYDRODIURIL) 50 MG tablet TAKE 1 TABLET (50 MG TOTAL) BY MOUTH DAILY.  30 tablet  4  . isosorbide mononitrate (IMDUR) 30 MG 24 hr tablet Take 1 tablet (30 mg total) by mouth 2 (two) times daily.  60 tablet  3  . isosorbide mononitrate (IMDUR) 30 MG 24 hr tablet TAKE 1 TABLET (30 MG TOTAL) BY MOUTH 2 (TWO) TIMES DAILY.  60 tablet  3  . levETIRAcetam (KEPPRA) 750 MG tablet Take 1 tablet (750 mg total) by mouth 2 (two) times daily.  60 tablet  11  . LORazepam (ATIVAN) 0.5 MG tablet Take 0.25 mg by mouth every 6 (six) hours as needed for anxiety.      [name]  losartan (COZAAR) 100 MG tablet Take 1 tablet (100 mg total) by mouth daily.  30 tablet  3  . nitroGLYCERIN (NITROSTAT) 0.4 MG SL tablet Place 1 tablet (0.4 mg total) under the tongue every 5 (five) minutes as needed.  25 tablet  11  . Omega-3 Fatty Acids ([employer]S NATURAL FISH OIL) 1200 MG CAPS Take 1 capsule by mouth 2 (two) times daily.  180 capsule  3  . phenytoin (DILANTIN) 100 MG ER capsule Take 1 capsule (100 mg total) by mouth 3 (three) times daily.  90 capsule  11  . potassium chloride (K-DUR) 10 MEQ tablet Take 1 tablet (10 mEq total) by mouth 2 (two) times daily.  60 tablet  11  . ranolazine (RANEXA) 500 MG 12 hr tablet Take 500 mg by mouth 2 (two) times daily.      . rosuvastatin (CRESTOR) 10 MG tablet Take 1 tablet (10 mg total) by mouth daily.  60 tablet  11   No current facility-administered medications on file prior to visit.    ALLERGIES: Allergies  Allergen Reactions  . Ace Inhibitors     REACTION: cough  . Bee Venom   . Eggs Or Egg-Derived Products     Abdominal discomfort-no allergy She can get a flu shot   . Tetracycline     REACTION: reaction not known    FAMILY HISTORY: Family History  Problem Relation Age of Onset  . Coronary artery disease    . Heart disease     . Heart disease Mother   . Diabetes Mother   . COPD Mother   . Hyperlipidemia Mother   . Hypertension Mother   . Cancer Father   . Drug abuse Paternal Grandmother   . Stroke Paternal Grandfather     SOCIAL HISTORY: History   Social History  . Marital Status: Divorced    Spouse Name: N/A    Number of Children: N/A  . Years of Education: N/A   Occupational History  . Not on file.   Social History Main Topics  . Smoking status: Current Every Day Smoker -- 1.50 packs/day for 36 years    Types: Cigarettes  . Smokeless tobacco: Never Used     Comment: 1.5-2 ppd  . Alcohol Use: No  . Drug Use: No  . Sexual Activity: Not Currently    Partners: Male    Birth Control/ Protection: None   Other Topics Concern  . Not on file   Social History Narrative  . No narrative on file    REVIEW OF SYSTEMS: Constitutional: No fevers, chills, or sweats, no generalized fatigue, change in appetite Eyes: No visual changes, double vision, eye pain Ear, nose and throat: No hearing loss, ear pain, nasal congestion, sore throat Cardiovascular: No chest pain, palpitations Respiratory:  No shortness of breath at rest or with exertion, wheezes GastrointestinaI: No nausea, vomiting, diarrhea, abdominal pain, fecal incontinence Genitourinary:  No dysuria, urinary retention or frequency Musculoskeletal:  No neck pain, back pain Integumentary: No rash, pruritus, skin lesions Neurological: as above Psychiatric: No depression, insomnia, anxiety Endocrine: No palpitations, fatigue, diaphoresis, mood swings, change in appetite, change in weight, increased thirst Hematologic/Lymphatic:  No anemia, purpura, petechiae. Allergic/Immunologic: no itchy/runny eyes, nasal congestion, recent allergic reactions, rashes  PHYSICAL EXAM: Filed Vitals:   03/17/14 1244  BP: 108/64  Pulse: 92   General: No acute distress Head:  Normocephalic/atraumatic Neck: supple, no paraspinal tenderness, full range of  motion Heart:  Regular rate and rhythm Lungs:  Clear  to auscultation bilaterally Back: No paraspinal tenderness Neurological Exam: alert and oriented to person, place, and time. Attention span and concentration intact, recent and remote memory intact, fund of knowledge intact.  Speech fluent and not dysarthric, language intact.  CN II-XII intact. Fundoscopic exam unremarkable without vessel changes, exudates, hemorrhages or papilledema.  Bulk and tone normal, muscle strength 5/5 throughout.  Sensation to light touch, temperature and vibration intact.  Deep tendon reflexes 2+ throughout, toes downgoing.  Finger to nose and heel to shin testing intact.  Gait normal but difficulty with tandem, Romberg with sway.  IMPRESSION: Complex partial seizures Cervical radiculopathy, right-sided Cerebrovascular disease  PLAN: 1.  Continue Dilantin ER 100mg  twice daily and Keppra 750mg  twice daily. 2.  Continue calcium (may reduce to 600mg ) and vitamin D 1000 IU daily 3.  PT for neck.  If pain persists, instructed to call. 4.  ASA for secondary stroke prevention. 5.  Follow up in 6 months.  Metta Clines, DO  CC:  Loura Pardon, MD

## 2014-04-05 ENCOUNTER — Ambulatory Visit: Payer: Medicare Other | Attending: Neurology

## 2014-04-05 DIAGNOSIS — M25519 Pain in unspecified shoulder: Secondary | ICD-10-CM | POA: Insufficient documentation

## 2014-04-05 DIAGNOSIS — M542 Cervicalgia: Secondary | ICD-10-CM | POA: Insufficient documentation

## 2014-04-07 ENCOUNTER — Ambulatory Visit: Payer: Medicare Other | Admitting: Physical Therapy

## 2014-04-07 DIAGNOSIS — M542 Cervicalgia: Secondary | ICD-10-CM | POA: Diagnosis not present

## 2014-04-12 ENCOUNTER — Ambulatory Visit: Payer: Medicare Other | Admitting: Physical Therapy

## 2014-04-12 DIAGNOSIS — M542 Cervicalgia: Secondary | ICD-10-CM | POA: Diagnosis not present

## 2014-04-14 ENCOUNTER — Ambulatory Visit: Payer: Medicare Other

## 2014-04-14 DIAGNOSIS — M542 Cervicalgia: Secondary | ICD-10-CM | POA: Diagnosis not present

## 2014-04-18 ENCOUNTER — Other Ambulatory Visit: Payer: Self-pay | Admitting: Cardiology

## 2014-04-19 ENCOUNTER — Ambulatory Visit: Payer: Medicare Other | Admitting: Physical Therapy

## 2014-04-19 DIAGNOSIS — M542 Cervicalgia: Secondary | ICD-10-CM | POA: Diagnosis not present

## 2014-04-19 NOTE — Telephone Encounter (Signed)
Medication Detail      Disp Refills Start End      losartan (COZAAR) 100 MG tablet 30 tablet 3 10/29/2013 10/29/2014      Sig - Route: Take 1 tablet (100 mg total) by mouth daily. - Oral      E-Prescribing Status: Receipt confirmed by pharmacy (10/29/2013  3:28 PM EST)

## 2014-04-21 ENCOUNTER — Ambulatory Visit: Payer: Medicare Other | Admitting: Physical Therapy

## 2014-04-21 DIAGNOSIS — M542 Cervicalgia: Secondary | ICD-10-CM | POA: Diagnosis not present

## 2014-04-27 ENCOUNTER — Telehealth: Payer: Self-pay | Admitting: Cardiology

## 2014-04-27 ENCOUNTER — Encounter: Payer: Self-pay | Admitting: Physical Therapy

## 2014-04-27 DIAGNOSIS — I779 Disorder of arteries and arterioles, unspecified: Secondary | ICD-10-CM

## 2014-04-27 DIAGNOSIS — I739 Peripheral vascular disease, unspecified: Principal | ICD-10-CM

## 2014-04-27 MED ORDER — CLOPIDOGREL BISULFATE 75 MG PO TABS: 75.0000 mg | ORAL_TABLET | Freq: Every day | ORAL | Status: DC

## 2014-04-27 MED ORDER — RANOLAZINE ER 500 MG PO TB12: 500.0000 mg | ORAL_TABLET | Freq: Two times a day (BID) | ORAL | Status: DC

## 2014-04-27 MED ORDER — CARVEDILOL 12.5 MG PO TABS: 12.5000 mg | ORAL_TABLET | Freq: Two times a day (BID) | ORAL | Status: DC

## 2014-04-27 MED ORDER — ROSUVASTATIN CALCIUM 10 MG PO TABS: 10.0000 mg | ORAL_TABLET | Freq: Every day | ORAL | Status: DC

## 2014-04-27 MED ORDER — ISOSORBIDE MONONITRATE ER 30 MG PO TB24: 30.0000 mg | ORAL_TABLET | Freq: Two times a day (BID) | ORAL | Status: DC

## 2014-04-27 MED ORDER — LOSARTAN POTASSIUM 100 MG PO TABS: 100.0000 mg | ORAL_TABLET | Freq: Every day | ORAL | Status: DC

## 2014-04-27 MED ORDER — POTASSIUM CHLORIDE ER 10 MEQ PO TBCR: 10.0000 meq | EXTENDED_RELEASE_TABLET | Freq: Two times a day (BID) | ORAL | Status: DC

## 2014-04-27 MED ORDER — HYDROCHLOROTHIAZIDE 50 MG PO TABS: 50.0000 mg | ORAL_TABLET | Freq: Every day | ORAL | Status: DC

## 2014-04-27 NOTE — Telephone Encounter (Signed)
Returned call to patient she stated she was due to have repeat carotid dopplers.Schedulers will call back to schedule.Also stated she needed refills on her medications.90 day refills sent to pharmacy.

## 2014-04-27 NOTE — Telephone Encounter (Signed)
New message  Pt states that she was called to schedule a Carotid.. No orders on file. Please assist

## 2014-04-27 NOTE — Telephone Encounter (Signed)
Patient stated she has been taking Plavix 75 mg daily needs to refilled also.

## 2014-04-29 DIAGNOSIS — E7800 Pure hypercholesterolemia, unspecified: Secondary | ICD-10-CM | POA: Diagnosis not present

## 2014-04-29 DIAGNOSIS — E041 Nontoxic single thyroid nodule: Secondary | ICD-10-CM | POA: Diagnosis not present

## 2014-04-29 DIAGNOSIS — I10 Essential (primary) hypertension: Secondary | ICD-10-CM | POA: Diagnosis not present

## 2014-04-29 DIAGNOSIS — E119 Type 2 diabetes mellitus without complications: Secondary | ICD-10-CM | POA: Diagnosis not present

## 2014-05-04 ENCOUNTER — Encounter: Payer: Self-pay | Admitting: Physical Therapy

## 2014-05-05 ENCOUNTER — Ambulatory Visit (HOSPITAL_COMMUNITY)
Admission: RE | Admit: 2014-05-05 | Discharge: 2014-05-05 | Disposition: A | Discharge status: Home / Self Care | Payer: Medicare Other | Source: Ambulatory Visit | Attending: Cardiology | Admitting: Cardiology

## 2014-05-05 DIAGNOSIS — I739 Peripheral vascular disease, unspecified: Secondary | ICD-10-CM

## 2014-05-05 DIAGNOSIS — I779 Disorder of arteries and arterioles, unspecified: Secondary | ICD-10-CM | POA: Insufficient documentation

## 2014-05-05 NOTE — Progress Notes (Signed)
Carotid Duplex Completed. °Brianna L Mazza,RVT °

## 2014-05-06 ENCOUNTER — Encounter: Payer: Self-pay | Admitting: Physical Therapy

## 2014-05-16 ENCOUNTER — Other Ambulatory Visit: Payer: Self-pay | Admitting: Cardiology

## 2014-05-17 ENCOUNTER — Other Ambulatory Visit: Payer: Self-pay

## 2014-05-17 ENCOUNTER — Telehealth: Payer: Self-pay | Admitting: Neurology

## 2014-05-17 DIAGNOSIS — I6523 Occlusion and stenosis of bilateral carotid arteries: Secondary | ICD-10-CM

## 2014-05-17 NOTE — Telephone Encounter (Signed)
See message below °

## 2014-05-17 NOTE — Telephone Encounter (Signed)
Pt is returning your call about medication please call her back at [phone]

## 2014-05-18 ENCOUNTER — Encounter: Payer: Self-pay | Admitting: Neurology

## 2014-05-18 ENCOUNTER — Ambulatory Visit (INDEPENDENT_AMBULATORY_CARE_PROVIDER_SITE_OTHER): Payer: Medicare Other | Admitting: Neurology

## 2014-05-18 VITALS — BP 140/60 | HR 88 | Resp 20 | Wt 150.1 lb

## 2014-05-18 DIAGNOSIS — I2510 Atherosclerotic heart disease of native coronary artery without angina pectoris: Secondary | ICD-10-CM

## 2014-05-18 DIAGNOSIS — G40209 Localization-related (focal) (partial) symptomatic epilepsy and epileptic syndromes with complex partial seizures, not intractable, without status epilepticus: Secondary | ICD-10-CM | POA: Diagnosis not present

## 2014-05-18 DIAGNOSIS — M5412 Radiculopathy, cervical region: Secondary | ICD-10-CM | POA: Diagnosis not present

## 2014-05-18 DIAGNOSIS — G40909 Epilepsy, unspecified, not intractable, without status epilepticus: Secondary | ICD-10-CM | POA: Diagnosis not present

## 2014-05-18 MED ORDER — GABAPENTIN 300 MG PO CAPS: 300.0000 mg | ORAL_CAPSULE | Freq: Three times a day (TID) | ORAL | Status: DC

## 2014-05-18 NOTE — Patient Instructions (Addendum)
1.  Start gabapentin 300mg  three times daily 2.  Continue Dilantin for now.  However, it may need to be discontinued due to interaction with the Ranexa.  I will contact the cardiologist to see if it needs to be changed.  Otherwise, we will have to stop Dilantin and go up on keppra 3.  Continue Keppra 750mg  twice daily 4.  We will get MRI of the cervical spine  5.  Take calcium 600mg  twice daily and vitamin D 8000 units daily 6. Follow up in 3 months.  Call in 4 weeks with update.

## 2014-05-18 NOTE — Progress Notes (Signed)
NEUROLOGY FOLLOW UP OFFICE NOTE  [name] [id number]  HISTORY OF PRESENT ILLNESS: Diane [name] is a 58 year old right-handed woman with history of CAD s/p CABG, hypertension, hyperlipidemia, type II diabetes mellitus, tobacco use, stroke, depression, fibromyalgia, migraine and COPD who follows up for neck pain and seizures.  Records personally reviewed.     UPDATE: Medications:  Dilantin ER 100mg  AM & 100mg  HS (she was supposed to take 200mg  at bedtime), Keppra 750mg  twice daily, calcium 1500mg  daily, vitamin D 1000 IU daily  Last spell:  03/18/13.  PT for neck pain was ineffective.  Reviewing medication list, she is taking Ranexa for angina.  Dilantin may decrease efficacy of Ranexa, so Dilantin may need to be switched.  She prefers to remain on Dilantin.   HISTORY: She was admitted to [facility] from 03/03/13 to 03/06/13 for recurrent seizure-like episodes.  These spells are were witnessed by Dr. Wallie Char, neuro-hospitalist, and described as "head turning to the right as well as eye deviation to the right, stiffening of right extremities with flexion of right upper extremity, and convulsive movements of right extremities. Spell lasted about 40-50 seconds and resolved without intervention. Within 30 seconds patient was again awake and conversant. She was slightly confused with respect to time."  She is unable to speak during these events.  Sometimes she was aware during these events and other times she was unaware and amnestic.  She did have urinary incontinence but no tongue biting.  It is reported that she can write down what she wants to say during these spells.  Given her stroke history, she was evaluatedfor possible left hemispheric stroke.  By his assessment, it seemed that her speech difficulties were non-organic, seizure-like activity non-organic, and he did not suspect acute stroke.    She had continuous EEG monitoring, which captured multiple spells without  electrographic correlate, however there are no details describing these events captured in the hospital notes.   However, she did have one captured clinical event, described as "build up to rhythmical theta activity and resultant slowing companied by clinical changes.  This may very well represent epileptiform activity.  No interictal abnormalities were noted".  Psychiatry was consulted, regarding possible pseudoseizures.  Despite the possible diagnosis of pseudoseizures, the neuro-hospitalists never made a sole diagnosis of pseudoseizures in the hospital notes.    MRI of brain performed on 03/03/13, which reported possible tiny acute infarct in the posterior left frontal lobe, but neurology believed this to be artifact.  There was also mention of incidental tiny cysts in right hippocampal region.  Encephalomalacia in the right frontoparietal region, from prior remote stroke, is seen.    I personally reviewed the EEG from June.  At 1:57:48, patient pauses, head slightly turned left, staring, with little movement and no convulsions.  She does pull at the covers a little, but no appreciable stereotypical hand movements.  When it is over, she begins moving around again.  Electrographically, she does have a build up of generalized theta activity up until 1:58:26, followed by generalized delta slowing lasting until 1:58:45.  Difficult to lateralize, but may be more prominent in the left hemisphere.  However, this appears to only be a clip of the event and the rest of the study is not available to me.  Therefore I cannot comment on the other captured spells.   06/09/13: Hgb A1c 9.3, TSH 2.17, LDL 98, albumin 3.4, glucose 123, WBC 8.7, Hgb 11.8, HCT 36.5, PLT 295, Na 136,  K 4.0, BUN 7, Cr 0.6, AP 87, AST 13, ALT 11, Ca 9.3 03/18/13: phenytoin level 11. DEXA scan (07/03/13): osteopenia noted.  She was initially on Keppra 750mg  BID alone, but spells persisted.  Dilantin ER 100mg  TID was started and the spells stopped.   She has not had any further spells since hospital discharge on 03/18/13.    In March, she moved and was doing some lifting.  Since April, she has right sided neck pain radiating down to the right shoulder, associated with tingling from the right ear down to the shoulder.  Hot and cold compresses help too.  No numbness, pain or weakness down the arm.  Pain worse when turning neck to the left.  She has trigger finger and numbness in the fingers when driving or picking up objects, thought to be carpal tunnel syndrome.  She has history of cervical fusion in the 1980s.  PAST MEDICAL HISTORY: Past Medical History  Diagnosis Date  . Coronary artery disease     post CABG in 3/07   . Hypertension   . Hyperlipidemia   . Dyslipidemia   . Diabetes mellitus     type 2  . Chronic bronchitis   . CVA (cerebral infarction) 1993  . Tobacco abuse   . COPD (chronic obstructive pulmonary disease)   . Stroke 1993  . Seizures   . Carotid stenosis   . Systolic murmur     known mild AS and MR    MEDICATIONS: Current Outpatient Prescriptions on File Prior to Visit  Medication Sig Dispense Refill  . albuterol (PROVENTIL) (2.5 MG/3ML) 0.083% nebulizer solution Take 3 mLs (2.5 mg total) by nebulization every 6 (six) hours as needed.  360 mL  3  . aspirin 325 MG tablet Take 1 tablet (325 mg total) by mouth daily.  90 tablet  3  . Calcium 1500 MG tablet Take 1 tablet (1,500 mg total) by mouth daily.  30 tablet  0  . carvedilol (COREG) 12.5 MG tablet Take 1 tablet (12.5 mg total) by mouth 2 (two) times daily with a meal.  180 tablet  3  . cetirizine (ZYRTEC) 10 MG tablet Take 1 tablet (10 mg total) by mouth daily.  90 tablet  3  . Cholecalciferol ([employer] VITAMIN D3) 1000 UNITS capsule Take 1 capsule (1,000 Units total) by mouth daily.  30 capsule  0  . clopidogrel (PLAVIX) 75 MG tablet Take 1 tablet (75 mg total) by mouth daily.  90 tablet  3  . dexlansoprazole (DEXILANT) 60 MG capsule TAKE 1 CAPSULE (60 MG TOTAL) BY  MOUTH DAILY.  30 capsule  5  . diphenhydrAMINE (BENADRYL) 25 mg capsule Take 2 capsules in AM and 1 capsule at night  270 capsule  3  . docusate sodium (COLACE) 100 MG capsule Take 1 capsule (100 mg total) by mouth 2 (two) times daily.  180 capsule  3  . DULoxetine (CYMBALTA) 60 MG capsule       . glipiZIDE-metformin (METAGLIP) 2.5-500 MG per tablet       . guaiFENesin (MUCINEX) 600 MG 12 hr tablet Take 2 tablets (1,200 mg total) by mouth 2 (two) times daily.  360 tablet  3  . hydrochlorothiazide (HYDRODIURIL) 50 MG tablet Take 1 tablet (50 mg total) by mouth daily.  90 tablet  3  . isosorbide mononitrate (IMDUR) 30 MG 24 hr tablet Take 1 tablet (30 mg total) by mouth 2 (two) times daily.  60 tablet  3  . isosorbide mononitrate (  IMDUR) 30 MG 24 hr tablet Take 1 tablet (30 mg total) by mouth 2 (two) times daily.  180 tablet  3  . levETIRAcetam (KEPPRA) 750 MG tablet Take 1 tablet (750 mg total) by mouth 2 (two) times daily.  60 tablet  11  . LORazepam (ATIVAN) 0.5 MG tablet Take 0.25 mg by mouth every 6 (six) hours as needed for anxiety.      [name] losartan (COZAAR) 100 MG tablet Take 1 tablet (100 mg total) by mouth daily.  90 tablet  3  . nitroGLYCERIN (NITROSTAT) 0.4 MG SL tablet Place 1 tablet (0.4 mg total) under the tongue every 5 (five) minutes as needed.  25 tablet  11  . Omega-3 Fatty Acids ([employer] NATURAL FISH OIL) 1200 MG CAPS Take 1 capsule by mouth 2 (two) times daily.  180 capsule  3  . phenytoin (DILANTIN) 100 MG ER capsule Take 1 capsule (100 mg total) by mouth 3 (three) times daily.  90 capsule  11  . potassium chloride (K-DUR) 10 MEQ tablet Take 1 tablet (10 mEq total) by mouth 2 (two) times daily.  180 tablet  3  . ranolazine (RANEXA) 500 MG 12 hr tablet Take 1 tablet (500 mg total) by mouth 2 (two) times daily.  180 tablet  3  . rosuvastatin (CRESTOR) 10 MG tablet Take 1 tablet (10 mg total) by mouth daily.  90 tablet  3   No current facility-administered medications on file prior to  visit.    ALLERGIES: Allergies  Allergen Reactions  . Ace Inhibitors     REACTION: cough  . Bee Venom   . Eggs Or Egg-Derived Products     Abdominal discomfort-no allergy She can get a flu shot   . Tetracycline     REACTION: reaction not known    FAMILY HISTORY: Family History  Problem Relation Age of Onset  . Coronary artery disease    . Heart disease    . Heart disease Mother   . Diabetes Mother   . COPD Mother   . Hyperlipidemia Mother   . Hypertension Mother   . Cancer Father   . Drug abuse Paternal Grandmother   . Stroke Paternal Grandfather     SOCIAL HISTORY: History   Social History  . Marital Status: Divorced    Spouse Name: N/A    Number of Children: N/A  . Years of Education: N/A   Occupational History  . Not on file.   Social History Main Topics  . Smoking status: Current Every Day Smoker -- 1.50 packs/day for 36 years    Types: Cigarettes  . Smokeless tobacco: Never Used     Comment: 1.5-2 ppd  . Alcohol Use: No  . Drug Use: No  . Sexual Activity: Not Currently    Partners: Male    Birth Control/ Protection: None   Other Topics Concern  . Not on file   Social History Narrative  . No narrative on file    REVIEW OF SYSTEMS: Constitutional: No fevers, chills, or sweats, no generalized fatigue, change in appetite Eyes: No visual changes, double vision, eye pain Ear, nose and throat: No hearing loss, ear pain, nasal congestion, sore throat Cardiovascular: No chest pain, palpitations Respiratory:  No shortness of breath at rest or with exertion, wheezes GastrointestinaI: No nausea, vomiting, diarrhea, abdominal pain, fecal incontinence Genitourinary:  No dysuria, urinary retention or frequency Musculoskeletal:  No neck pain, back pain Integumentary: No rash, pruritus, skin lesions Neurological: as above Psychiatric: No  depression, insomnia, anxiety Endocrine: No palpitations, fatigue, diaphoresis, mood swings, change in appetite, change  in weight, increased thirst Hematologic/Lymphatic:  No anemia, purpura, petechiae. Allergic/Immunologic: no itchy/runny eyes, nasal congestion, recent allergic reactions, rashes  PHYSICAL EXAM: Filed Vitals:   05/18/14 1357  BP: 140/60  Pulse: 88  Resp: 20   General: No acute distress Head:  Normocephalic/atraumatic Neck: supple, no paraspinal tenderness, full range of motion Heart:  Regular rate and rhythm Lungs:  Clear to auscultation bilaterally Back: No paraspinal tenderness Neurological Exam: alert and oriented to person, place, and time. Attention span and concentration intact, recent and remote memory intact, fund of knowledge intact.  Speech fluent and not dysarthric, language intact.  CN II-XII intact. Fundoscopic exam unremarkable without vessel changes, exudates, hemorrhages or papilledema.  Bulk and tone normal, muscle strength 5/5 throughout.  Sensation to light touch, temperature and vibration intact.  Deep tendon reflexes 2+ throughout, toes downgoing.  Finger to nose and heel to shin testing intact.  Gait normal, Romberg negative.  IMPRESSION: Complex partial seizures Cervical radiculopathy, right-sided Cerebrovascular disease  PLAN: 1.  Will contact patient's cardiologist, Dr. Martinique, to see if she needs to remain on Ranexa.  If so, Dilantin will have to be discontinued and I would increase Keppra. 2.  Continue Keppra 750mg  twice daily 3.  For radicular pain, will prescribe gabapentin 300mg  three times daily. 4.  Will get MRI of the cervical spine to evaluate for recurrent nerve root impingement (such as at C5), as the pain has been ongoing since March and she failed PT. 5.  Calcium 600mg  twice daily and vitamin D 800 units daily 6.  ASA for secondary stroke prevention. 7.  Follow up in 3 months.  Metta Clines, DO  CC:  Loura Pardon, MD  Peter Martinique, MD

## 2014-05-18 NOTE — Addendum Note (Signed)
Addended by: Charyl Bigger E on: 05/18/2014 02:44 PM   Modules accepted: Orders

## 2014-05-21 ENCOUNTER — Encounter: Payer: Self-pay | Admitting: Gastroenterology

## 2014-05-22 ENCOUNTER — Ambulatory Visit
Admission: RE | Admit: 2014-05-22 | Discharge: 2014-05-22 | Disposition: A | Discharge status: Home / Self Care | Payer: Medicare Other | Source: Ambulatory Visit | Attending: Neurology | Admitting: Neurology

## 2014-05-22 DIAGNOSIS — M47812 Spondylosis without myelopathy or radiculopathy, cervical region: Secondary | ICD-10-CM | POA: Diagnosis not present

## 2014-05-22 DIAGNOSIS — G40209 Localization-related (focal) (partial) symptomatic epilepsy and epileptic syndromes with complex partial seizures, not intractable, without status epilepticus: Secondary | ICD-10-CM

## 2014-05-22 DIAGNOSIS — M5020 Other cervical disc displacement, unspecified cervical region: Secondary | ICD-10-CM | POA: Diagnosis not present

## 2014-05-22 DIAGNOSIS — M5412 Radiculopathy, cervical region: Secondary | ICD-10-CM

## 2014-05-22 DIAGNOSIS — G40909 Epilepsy, unspecified, not intractable, without status epilepticus: Secondary | ICD-10-CM

## 2014-05-22 DIAGNOSIS — M4802 Spinal stenosis, cervical region: Secondary | ICD-10-CM | POA: Diagnosis not present

## 2014-05-28 ENCOUNTER — Other Ambulatory Visit: Payer: Self-pay | Admitting: *Deleted

## 2014-05-28 DIAGNOSIS — M542 Cervicalgia: Secondary | ICD-10-CM

## 2014-06-02 ENCOUNTER — Telehealth: Payer: Self-pay | Admitting: *Deleted

## 2014-06-02 NOTE — Telephone Encounter (Signed)
Dr Christella Noa would be calling with day and time for c4 nerve root injection

## 2014-06-02 NOTE — Telephone Encounter (Signed)
Message copied by Claudie Revering on Wed Jun 02, 2014  8:33 AM ------      Message from: JAFFE, ADAM R      Created: Fri May 28, 2014 10:40 AM       I spoke with Ms. Freund.  The MRI does show pinching of the right C4 nerve root.  This may be causing the pain down the right side of her neck.  I would like to set her up for right C4 selective nerve root injection.      ----- Message -----         From: Rad Results In Interface         Sent: 05/23/2014  10:06 AM           To: Dudley Major, DO                   ------

## 2014-06-09 ENCOUNTER — Other Ambulatory Visit (INDEPENDENT_AMBULATORY_CARE_PROVIDER_SITE_OTHER): Payer: Medicare Other

## 2014-06-09 ENCOUNTER — Telehealth: Payer: Self-pay | Admitting: Family Medicine

## 2014-06-09 DIAGNOSIS — I10 Essential (primary) hypertension: Secondary | ICD-10-CM

## 2014-06-09 DIAGNOSIS — E119 Type 2 diabetes mellitus without complications: Secondary | ICD-10-CM

## 2014-06-09 DIAGNOSIS — E785 Hyperlipidemia, unspecified: Secondary | ICD-10-CM | POA: Diagnosis not present

## 2014-06-09 LAB — HEMOGLOBIN A1C: Hgb A1c MFr Bld: 8 % — ABNORMAL HIGH (ref 4.6–6.5)

## 2014-06-09 LAB — COMPREHENSIVE METABOLIC PANEL
ALT: 13 U/L (ref 0–35)
AST: 15 U/L (ref 0–37)
Albumin: 3.7 g/dL (ref 3.5–5.2)
Alkaline Phosphatase: 67 U/L (ref 39–117)
BUN: 7 mg/dL (ref 6–23)
CO2: 27 mEq/L (ref 19–32)
Calcium: 9.3 mg/dL (ref 8.4–10.5)
Chloride: 97 mEq/L (ref 96–112)
Creatinine, Ser: 0.6 mg/dL (ref 0.4–1.2)
GFR: 112.61 mL/min (ref >60.00)
Glucose, Bld: 139 mg/dL — ABNORMAL HIGH (ref 70–99)
Potassium: 4.6 mEq/L (ref 3.5–5.1)
Sodium: 132 mEq/L — ABNORMAL LOW (ref 135–145)
Total Bilirubin: 0.4 mg/dL (ref 0.2–1.2)
Total Protein: 7 g/dL (ref 6.0–8.3)

## 2014-06-09 LAB — LIPID PANEL
Cholesterol: 152 mg/dL (ref 0–200)
HDL: 48.4 mg/dL (ref >39.00)
LDL Cholesterol: 79 mg/dL (ref 0–99)
NonHDL: 103.6
Total CHOL/HDL Ratio: 3
Triglycerides: 123 mg/dL (ref 0.0–149.0)
VLDL: 24.6 mg/dL (ref 0.0–40.0)

## 2014-06-09 LAB — CBC WITH DIFFERENTIAL/PLATELET
Basophils Absolute: 0 10*3/uL (ref 0.0–0.1)
Basophils Relative: 0.3 % (ref 0.0–3.0)
Eosinophils Absolute: 0.2 10*3/uL (ref 0.0–0.7)
Eosinophils Relative: 2.1 % (ref 0.0–5.0)
HCT: 36.8 % (ref 36.0–46.0)
Hemoglobin: 11.5 g/dL — ABNORMAL LOW (ref 12.0–15.0)
Lymphocytes Relative: 28.8 % (ref 12.0–46.0)
Lymphs Abs: 2.1 10*3/uL (ref 0.7–4.0)
MCHC: 31.4 g/dL (ref 30.0–36.0)
MCV: 68.8 fl — ABNORMAL LOW (ref 78.0–100.0)
Monocytes Absolute: 0.7 10*3/uL (ref 0.1–1.0)
Monocytes Relative: 8.8 % (ref 3.0–12.0)
Neutro Abs: 4.5 10*3/uL (ref 1.4–7.7)
Neutrophils Relative %: 60 % (ref 43.0–77.0)
Platelets: 298 10*3/uL (ref 150.0–400.0)
RBC: 5.34 Mil/uL — ABNORMAL HIGH (ref 3.87–5.11)
RDW: 19.3 % — ABNORMAL HIGH (ref 11.5–15.5)
WBC: 7.5 10*3/uL (ref 4.0–10.5)

## 2014-06-09 LAB — TSH: TSH: 1.81 u[IU]/mL (ref 0.35–4.50)

## 2014-06-09 NOTE — Telephone Encounter (Signed)
Scratch the a1c if she has had it at Dr Almetta Lovely office within the last 3 mo please

## 2014-06-09 NOTE — Telephone Encounter (Signed)
Message copied by [name] on Wed Jun 09, 2014  6:46 AM ------      Message from: Ellamae Sia      Created: Mon May 31, 2014  4:41 PM      Regarding: Lab orders for Wednesday, 9.9.15       Patient is scheduled for CPX labs, please order future labs, Thanks , Terri            Diabetic bundle, needs A1C ------

## 2014-06-16 ENCOUNTER — Encounter: Payer: Self-pay | Admitting: Family Medicine

## 2014-06-16 ENCOUNTER — Ambulatory Visit (INDEPENDENT_AMBULATORY_CARE_PROVIDER_SITE_OTHER): Payer: Medicare Other | Admitting: Family Medicine

## 2014-06-16 VITALS — BP 104/60 | HR 82 | Temp 98.2°F | Ht <= 58 in | Wt 151.5 lb

## 2014-06-16 DIAGNOSIS — Z23 Encounter for immunization: Secondary | ICD-10-CM | POA: Diagnosis not present

## 2014-06-16 DIAGNOSIS — I2510 Atherosclerotic heart disease of native coronary artery without angina pectoris: Secondary | ICD-10-CM | POA: Diagnosis not present

## 2014-06-16 DIAGNOSIS — I10 Essential (primary) hypertension: Secondary | ICD-10-CM

## 2014-06-16 DIAGNOSIS — E119 Type 2 diabetes mellitus without complications: Secondary | ICD-10-CM

## 2014-06-16 DIAGNOSIS — Z0000 Encounter for general adult medical examination without abnormal findings: Secondary | ICD-10-CM

## 2014-06-16 DIAGNOSIS — E785 Hyperlipidemia, unspecified: Secondary | ICD-10-CM

## 2014-06-16 DIAGNOSIS — E871 Hypo-osmolality and hyponatremia: Secondary | ICD-10-CM

## 2014-06-16 DIAGNOSIS — F17200 Nicotine dependence, unspecified, uncomplicated: Secondary | ICD-10-CM

## 2014-06-16 NOTE — Patient Instructions (Signed)
Don't forget to schedule your annual mammogram Flu vaccine today  Pneumonia vaccine today   If you are interested in a shingles/zoster vaccine - call your insurance to check on coverage,( you should not get it within 1 month of other vaccines) , then call us for a prescription  for it to take to a pharmacy that gives the shot , or make a nurse visit to get it here depending on your coverage Take a look at the packet I gave you on advanced directive

## 2014-06-16 NOTE — Progress Notes (Signed)
Pre visit review using our clinic review tool, if applicable. No additional management support is needed unless otherwise documented below in the visit note. 

## 2014-06-16 NOTE — Progress Notes (Signed)
Subjective:    Patient ID: Diane [name], female    DOB: 1955/11/02, 58 y.o.   MRN: [id number]  HPI I have personally reviewed the Medicare Annual Wellness questionnaire and have noted 1. The patient's medical and social history 2. Their use of alcohol, tobacco or illicit drugs 3. Their current medications and supplements 4. The patient's functional ability including ADL's, fall risks, home safety risks and hearing or visual             impairment. 5. Diet and physical activities 6. Evidence for depression or mood disorders  The patients weight, height, BMI have been recorded in the chart and visual acuity is per eye clinic.  I have made referrals, counseling and provided education to the patient based review of the above and I have provided the pt with a written personalized care plan for preventive services.  Having allergy symptoms - ragweed/ and lot of post nasal drip   Also has a bulging disc in neck- and will be going to neuro surg to get injections    See scanned forms.  Routine anticipatory guidance given to patient.  See health maintenance. Colon cancer screening 6/09 - polyp 10 year recall  Breast cancer screening -has not been getting them - she will schedule at the breast center  Self breast exam nothing new  Flu vaccine- will get that today  Tetanus vaccine 2/11 and she is not around babies  Pneumovax 2/00  Zoster vaccine is interested /if ins covers   Last pap was 1/15 she thinks - goes to [facility] - has HPV and they watch this    Advance directive does not have one -will work on it , she does have POA-her sister  Cognitive function addressed- see scanned forms- and if abnormal then additional documentation follows. - sometimes forgets words  , feels like she is her baseline (stroke/ seizures)    PMH and SH reviewed Mother has stage 4 kidney failure - lives with her now - has to care for  Her   Meds, vitals, and allergies reviewed.   ROS: See  HPI.  Otherwise negative.    Still smoking  She sees Dr Halford Chessman soon  No plans to quit yet   Will make her opthy appt soon    DM- sees Dr Chalmers Cater- she was taken off of her insulin- thrilled  Her A1C was down to 7.5 with Dr Chalmers Cater - in July  Getting out more/exercise and a bit of swimming   dexa osteopenia 10/14  Dr Loretta Plume ordered it  Not as bad as expected- she is now taking ca and D No recent fractures  Watching this due to dilantin and smoking    bp is stable today  No cp or palpitations or headaches or edema  No side effects to medicines  BP Readings from Last 3 Encounters:  06/16/14 104/60  05/18/14 140/60  03/17/14 108/64     Hyperlipidemia Lab Results  Component Value Date   CHOL 152 06/09/2014   CHOL 137 12/14/2013   CHOL 163 06/09/2013   Lab Results  Component Value Date   HDL 48.40 06/09/2014   HDL 41.90 12/14/2013   HDL 38.40* 06/09/2013   Lab Results  Component Value Date   LDLCALC 79 06/09/2014   LDLCALC 80 12/14/2013   LDLCALC 46 06/13/2011   Lab Results  Component Value Date   TRIG 123.0 06/09/2014   TRIG 75.0 12/14/2013   TRIG 251.0* 06/09/2013   Lab Results  Component Value Date   CHOLHDL 3 06/09/2014   CHOLHDL 3 12/14/2013   CHOLHDL 4 06/09/2013   Lab Results  Component Value Date   LDLDIRECT 98.0 06/09/2013   LDLDIRECT 53.6 12/12/2012   LDLDIRECT 79.3 06/06/2012    In good shape - is eating better -happy with that   Hb is improved Lab Results  Component Value Date   WBC 7.5 06/09/2014   HGB 11.5* 06/09/2014   HCT 36.8 06/09/2014   MCV 68.8 aL* 06/09/2014   PLT 298.0 06/09/2014      Patient Active Problem List   Diagnosis Date Noted  . Complex partial seizures 09/16/2013  . Encounter for Medicare annual wellness exam 06/16/2013  . Routine general medical examination at a health care facility 06/08/2013  . Seizures 03/25/2013  . Goiter 09/01/2012  . Other screening mammogram 06/13/2012  . Allergic rhinitis 06/13/2012  . Chronic bronchitis 04/02/2012  .  Hyponatremia 12/11/2011  . Carotid artery disease 08/20/2011  . Other dysphagia 09/14/2010  . DIZZINESS 07/26/2010  . OBSTRUCTIVE SLEEP APNEA 09/18/2007  . TOBACCO USE 08/08/2007  . DIABETES MELLITUS, TYPE II 03/26/2007  . HYPERLIPIDEMIA 03/26/2007  . ANXIETY 03/26/2007  . DEPRESSION 03/26/2007  . MIGRAINE HEADACHE 03/26/2007  . CARPAL TUNNEL SYNDROME, BILATERAL 03/26/2007  . HYPERTENSION 03/26/2007  . CORONARY ARTERY DISEASE 03/26/2007  . CVA 03/26/2007  . GERD 03/26/2007  . FIBROMYALGIA 03/26/2007   Past Medical History  Diagnosis Date  . Coronary artery disease     post CABG in 3/07   . Hypertension   . Hyperlipidemia   . Dyslipidemia   . Diabetes mellitus     type 2  . Chronic bronchitis   . CVA (cerebral infarction) 1993  . Tobacco abuse   . COPD (chronic obstructive pulmonary disease)   . Stroke 1993  . Seizures   . Carotid stenosis   . Systolic murmur     known mild AS and MR   Past Surgical History  Procedure Laterality Date  . Coronary stent placement  08/11/06    PCI of her ciurcumflex/OM vessel  . Cardiac catheterization  11/29/05    EF of 55%  . Cardiac catheterization  08/06/06    EF of 45-50%  . Coronary artery bypass graft  12/04/2005    x5 -- left internal mammary artery to the LAD, left radial artery to the ramus intermedius, saphenous vein graft to the obtuse marginal 1, sequential saphenous vein grat to the acute marginal and posterior descending, endoscopic vein harvesting from the left thigh with open vein harvest from right leg   History  Substance Use Topics  . Smoking status: Current Every Day Smoker -- 1.50 packs/day for 36 years    Types: Cigarettes  . Smokeless tobacco: Never Used     Comment: 1.5-2 ppd  . Alcohol Use: No   Family History  Problem Relation Age of Onset  . Coronary artery disease    . Heart disease    . Heart disease Mother   . Diabetes Mother   . COPD Mother   . Hyperlipidemia Mother   . Hypertension Mother   .  Cancer Father   . Drug abuse Paternal Grandmother   . Stroke Paternal Grandfather    Allergies  Allergen Reactions  . Ace Inhibitors     REACTION: cough  . Bee Venom   . Eggs Or Egg-Derived Products     Abdominal discomfort-no allergy She can get a flu shot   . Tetracycline  REACTION: reaction not known   Current Outpatient Prescriptions on File Prior to Visit  Medication Sig Dispense Refill  . albuterol (PROVENTIL) (2.5 MG/3ML) 0.083% nebulizer solution Take 3 mLs (2.5 mg total) by nebulization every 6 (six) hours as needed.  360 mL  3  . aspirin 325 MG tablet Take 1 tablet (325 mg total) by mouth daily.  90 tablet  3  . Calcium 1500 MG tablet Take 1 tablet (1,500 mg total) by mouth daily.  30 tablet  0  . carvedilol (COREG) 12.5 MG tablet Take 1 tablet (12.5 mg total) by mouth 2 (two) times daily with a meal.  180 tablet  3  . cetirizine (ZYRTEC) 10 MG tablet Take 1 tablet (10 mg total) by mouth daily.  90 tablet  3  . Cholecalciferol ([employer]S VITAMIN D3) 1000 UNITS capsule Take 1 capsule (1,000 Units total) by mouth daily.  30 capsule  0  . clopidogrel (PLAVIX) 75 MG tablet Take 1 tablet (75 mg total) by mouth daily.  90 tablet  3  . dexlansoprazole (DEXILANT) 60 MG capsule TAKE 1 CAPSULE (60 MG TOTAL) BY MOUTH DAILY.  30 capsule  5  . diphenhydrAMINE (BENADRYL) 25 mg capsule Take 2 capsules in AM and 1 capsule at night  270 capsule  3  . docusate sodium (COLACE) 100 MG capsule Take 1 capsule (100 mg total) by mouth 2 (two) times daily.  180 capsule  3  . DULoxetine (CYMBALTA) 60 MG capsule       . gabapentin (NEURONTIN) 300 MG capsule Take 1 capsule (300 mg total) by mouth 3 (three) times daily.  90 capsule  2  . glipiZIDE-metformin (METAGLIP) 2.5-500 MG per tablet       . guaiFENesin (MUCINEX) 600 MG 12 hr tablet Take 2 tablets (1,200 mg total) by mouth 2 (two) times daily.  360 tablet  3  . hydrochlorothiazide (HYDRODIURIL) 50 MG tablet Take 1 tablet (50 mg total) by mouth daily.   90 tablet  3  . isosorbide mononitrate (IMDUR) 30 MG 24 hr tablet Take 1 tablet (30 mg total) by mouth 2 (two) times daily.  60 tablet  3  . isosorbide mononitrate (IMDUR) 30 MG 24 hr tablet Take 1 tablet (30 mg total) by mouth 2 (two) times daily.  180 tablet  3  . levETIRAcetam (KEPPRA) 750 MG tablet Take 1 tablet (750 mg total) by mouth 2 (two) times daily.  60 tablet  11  . LORazepam (ATIVAN) 0.5 MG tablet Take 0.25 mg by mouth every 6 (six) hours as needed for anxiety.      [name] losartan (COZAAR) 100 MG tablet Take 1 tablet (100 mg total) by mouth daily.  90 tablet  3  . nitroGLYCERIN (NITROSTAT) 0.4 MG SL tablet Place 1 tablet (0.4 mg total) under the tongue every 5 (five) minutes as needed.  25 tablet  11  . Omega-3 Fatty Acids (CVS NATURAL FISH OIL) 1200 MG CAPS Take 1 capsule by mouth 2 (two) times daily.  180 capsule  3  . phenytoin (DILANTIN) 100 MG ER capsule Take 1 capsule (100 mg total) by mouth 3 (three) times daily.  90 capsule  11  . potassium chloride (K-DUR) 10 MEQ tablet Take 1 tablet (10 mEq total) by mouth 2 (two) times daily.  180 tablet  3  . ranolazine (RANEXA) 500 MG 12 hr tablet Take 1 tablet (500 mg total) by mouth 2 (two) times daily.  180 tablet  3  . rosuvastatin (  CRESTOR) 10 MG tablet Take 1 tablet (10 mg total) by mouth daily.  90 tablet  3   No current facility-administered medications on file prior to visit.    Review of Systems Review of Systems  Constitutional: Negative for fever, appetite change,  and unexpected weight change.  Eyes: Negative for pain and visual disturbance.  Respiratory: Negative for cough and pos for baseline sob on exertion  Cardiovascular: Negative for cp or palpitations    Gastrointestinal: Negative for nausea, diarrhea and constipation.  Genitourinary: Negative for urgency and frequency.  Skin: Negative for pallor or rash   MSK pos for chronic joint/neck and back pain  Neurological: Negative for weakness, light-headedness, numbness  and headaches. neg for recent seizures  Hematological: Negative for adenopathy. Does not bruise/bleed easily.  Psychiatric/Behavioral: Negative for dysphoric mood. The patient is not nervous/anxious.         Objective:   Physical Exam  Constitutional: She appears well-developed and well-nourished. No distress.  obese and well appearing   HENT:  Head: Normocephalic and atraumatic.  Right Ear: External ear normal.  Left Ear: External ear normal.  Nose: Nose normal.  Mouth/Throat: Oropharynx is clear and moist.  Eyes: Conjunctivae and EOM are normal. Pupils are equal, round, and reactive to light. Right eye exhibits no discharge. Left eye exhibits no discharge. No scleral icterus.  Neck: Normal range of motion. Neck supple. No JVD present. No thyromegaly present.  Cardiovascular: Normal rate, regular rhythm, normal heart sounds and intact distal pulses.  Exam reveals no gallop.   Pulmonary/Chest: Effort normal and breath sounds normal. No respiratory distress. She has no rales.  Diffusely distant bs   Abdominal: Soft. Bowel sounds are normal. She exhibits no distension and no mass. There is no tenderness.  Musculoskeletal: She exhibits no edema and no tenderness.  Poor rom of LS and neck  Lymphadenopathy:    She has no cervical adenopathy.  Neurological: She is alert. She has normal reflexes. No cranial nerve deficit. She exhibits normal muscle tone. Coordination normal.  Skin: Skin is warm and dry. No rash noted. No erythema. No pallor.  Psychiatric: She has a normal mood and affect.          Assessment & Plan:   Problem List Items Addressed This Visit     Cardiovascular and Mediastinum   HYPERTENSION      bp in fair control at this time  BP Readings from Last 1 Encounters:  06/16/14 104/60   No changes needed Disc lifstyle change with low sodium diet and exercise  Labs reviewed       Endocrine   DIABETES MELLITUS, TYPE II      Seeing Dr Chalmers Cater Lab Results    Component Value Date   HGBA1C 8.0* 06/09/2014    Overall improving  Also commended for wt loss       Other   HYPERLIPIDEMIA     Disc goals for lipids and reasons to control them Rev labs with pt Rev low sat fat diet in detail   crestor and diet        TOBACCO USE     Disc in detail risks of smoking and possible outcomes including copd, vascular/ heart disease, cancer , respiratory and sinus infections  Pt voices understanding Pt is not ready to quit yet  She has pulm f/u for copd upcoming     Hyponatremia     Mild/likely due to her diuretic  Will continue to follow  Asymptomatic  Encounter for Medicare annual wellness exam - Primary     Reviewed health habits including diet and exercise and skin cancer prevention Reviewed appropriate screening tests for age  Also reviewed health mt list, fam hx and immunization status , as well as social and family history    Labs reviewed  Pt will schedule her own mammogram Will check on ins coverage of zostavax       Other Visit Diagnoses   Need for prophylactic vaccination and inoculation against influenza        Relevant Orders       Flu Vaccine QUAD 36+ mos PF IM (Fluarix Quad PF) (Completed)    Need for 23-polyvalent pneumococcal polysaccharide vaccine        Relevant Orders       Pneumococcal polysaccharide vaccine 23-valent greater than or equal to 2yo subcutaneous/IM (Completed)

## 2014-06-17 NOTE — Assessment & Plan Note (Signed)
Disc in detail risks of smoking and possible outcomes including copd, vascular/ heart disease, cancer , respiratory and sinus infections  Pt voices understanding Pt is not ready to quit yet  She has pulm f/u for copd upcoming

## 2014-06-17 NOTE — Assessment & Plan Note (Signed)
Disc goals for lipids and reasons to control them Rev labs with pt Rev low sat fat diet in detail   crestor and diet

## 2014-06-17 NOTE — Assessment & Plan Note (Signed)
bp in fair control at this time  BP Readings from Last 1 Encounters:  06/16/14 104/60   No changes needed Disc lifstyle change with low sodium diet and exercise  Labs reviewed

## 2014-06-17 NOTE — Assessment & Plan Note (Signed)
Mild/likely due to her diuretic  Will continue to follow  Asymptomatic

## 2014-06-17 NOTE — Assessment & Plan Note (Signed)
Seeing Dr Chalmers Cater Lab Results  Component Value Date   HGBA1C 8.0* 06/09/2014    Overall improving  Also commended for wt loss

## 2014-06-17 NOTE — Assessment & Plan Note (Signed)
Reviewed health habits including diet and exercise and skin cancer prevention Reviewed appropriate screening tests for age  Also reviewed health mt list, fam hx and immunization status , as well as social and family history    Labs reviewed  Pt will schedule her own mammogram Will check on ins coverage of zostavax

## 2014-06-18 ENCOUNTER — Ambulatory Visit (INDEPENDENT_AMBULATORY_CARE_PROVIDER_SITE_OTHER): Payer: Medicare Other | Admitting: Pulmonary Disease

## 2014-06-18 ENCOUNTER — Telehealth: Payer: Self-pay | Admitting: *Deleted

## 2014-06-18 ENCOUNTER — Encounter: Payer: Self-pay | Admitting: Pulmonary Disease

## 2014-06-18 ENCOUNTER — Ambulatory Visit (INDEPENDENT_AMBULATORY_CARE_PROVIDER_SITE_OTHER)
Admission: RE | Admit: 2014-06-18 | Discharge: 2014-06-18 | Disposition: A | Discharge status: Home / Self Care | Payer: Medicare Other | Source: Ambulatory Visit | Attending: Pulmonary Disease | Admitting: Pulmonary Disease

## 2014-06-18 VITALS — BP 140/72 | HR 96 | Temp 97.7°F | Ht <= 58 in | Wt 151.2 lb

## 2014-06-18 DIAGNOSIS — G4733 Obstructive sleep apnea (adult) (pediatric): Secondary | ICD-10-CM | POA: Diagnosis not present

## 2014-06-18 DIAGNOSIS — I2510 Atherosclerotic heart disease of native coronary artery without angina pectoris: Secondary | ICD-10-CM

## 2014-06-18 DIAGNOSIS — J309 Allergic rhinitis, unspecified: Secondary | ICD-10-CM | POA: Diagnosis not present

## 2014-06-18 DIAGNOSIS — F17200 Nicotine dependence, unspecified, uncomplicated: Secondary | ICD-10-CM | POA: Diagnosis not present

## 2014-06-18 DIAGNOSIS — J411 Mucopurulent chronic bronchitis: Secondary | ICD-10-CM | POA: Diagnosis not present

## 2014-06-18 DIAGNOSIS — R059 Cough, unspecified: Secondary | ICD-10-CM | POA: Diagnosis not present

## 2014-06-18 DIAGNOSIS — R079 Chest pain, unspecified: Secondary | ICD-10-CM | POA: Diagnosis not present

## 2014-06-18 DIAGNOSIS — R05 Cough: Secondary | ICD-10-CM | POA: Diagnosis not present

## 2014-06-18 MED ORDER — FLUTICASONE PROPIONATE 50 MCG/ACT NA SUSP: 2.0000 | Freq: Every day | NASAL | Status: DC

## 2014-06-18 MED ORDER — ALBUTEROL SULFATE 108 (90 BASE) MCG/ACT IN AEPB: 2.0000 | INHALATION_SPRAY | Freq: Four times a day (QID) | RESPIRATORY_TRACT | Status: DC | PRN

## 2014-06-18 NOTE — Progress Notes (Signed)
Chief Complaint  Patient presents with  . Follow-up    Last seen 08/2012 OSA/COPD. Pt  has not been wearing CPAP-- states that she is about to start back consistently.     History of Present Illness: Diane [name] is a 58 y.o. female smoker with chronic cough, chronic bronchitis rhinitis, and OSA.  I last saw her in November 2013.  She was in hospital with seizures last year.  She has been seizure free since.  She wants to get back on CPAP.  She has her machine, but has not used for almost 1 year.  She is still snoring, and wakes up feeling like she can't breath.  She gets sleepy during the day.  Her Epworth is 12 out of 24.  She still smokes 1.5 packs per day.  She gets cough with clear to yellow sputum.  She also has sinus congestion with post-nasal drip, and this also triggers he cough.   Tests: PSG 04/10/06>> RDI 105  CPAP 06/13/11 to 07/10/11>>Used on 20 of 28 nights with median 3 hrs 45 min.  Average AHI 5 with median CPAP 7 cm H2O and 95th percentile CPAP 10 cm H2O. PFT 05/12/12>>FEV1 1.83 (98%), FEV1% 80, TLC 3.83 (98%), DLCO 61% (corrects for lung volumes), no BD response  PMHx, PSHx, Medications, Allergies, Fhx, Shx reviewed.  Physical Exam:  General - Obese HEENT - no sinus tenderness, clear nasal discharge, no oral exudate, no LAN Cardiac - s1s2 regular, no murmur Chest - decreased breath sounds, normal respiratory excursion, no wheeze/rales Abd - soft, nontender Ext - no edema Neuro - normal strength Psych - normal mood, behavior   Assessment/Plan:  [name], MD [facility] Pager:  [phone] 06/18/2014, 3:58 PM

## 2014-06-18 NOTE — Telephone Encounter (Signed)
Noted  

## 2014-06-18 NOTE — Assessment & Plan Note (Signed)
She would like to resume therapy for sleep apnea.  Will need to assess current status of her sleep apnea with sleep study first.  She has to take care of her elderly mother, and would have difficulty doing in lab sleep study.  Will arrange for home sleep study to further assess.

## 2014-06-18 NOTE — Assessment & Plan Note (Signed)
Reviewed options to help her stop smoking.

## 2014-06-18 NOTE — Assessment & Plan Note (Signed)
Will have her use prn proair respiclick.  Will arrange for PFT and CXR to further assess.

## 2014-06-18 NOTE — Telephone Encounter (Signed)
Home sleep test ordered today during visit. Unable to schedule. Patient insurance is Medicare/Medicaid - Secondary insurance will not cover the equipment at home sleep study-- insurance prefers an In-Lab Sleep study Pt does not want to schedule the In-Lab Sleep Study at this time. Will call back when she decides to schedule.  Will send to Dr Halford Chessman as Juluis Rainier.

## 2014-06-18 NOTE — Assessment & Plan Note (Signed)
She is to continue nasal irrigation.  Will have her try flonase.

## 2014-06-18 NOTE — Patient Instructions (Signed)
Chest xray today Will schedule breathing test (PFT) Flonase two sprays each nostril daily for one week, then as needed Proair respiclick two puffs every 6 hours as needed Will arrange for home sleep study Follow up in 4 weeks with Dr. Halford Chessman or Tammy Parrett

## 2014-06-21 ENCOUNTER — Telehealth: Payer: Self-pay | Admitting: Pulmonary Disease

## 2014-06-21 NOTE — Telephone Encounter (Signed)
Called spoke with pt. She reports the proair respclick is not covered by insurance. Pt used to be on ventolin and this was covered. Please advise Dr. Halford Chessman thanks

## 2014-06-22 ENCOUNTER — Telehealth: Payer: Self-pay | Admitting: Pulmonary Disease

## 2014-06-22 NOTE — Telephone Encounter (Signed)
[phone] calling back

## 2014-06-22 NOTE — Telephone Encounter (Signed)
Dg Chest 2 View  06/18/2014   CLINICAL DATA:  Chronic cough.  Upper chest pain.  EXAM: CHEST  2 VIEW  COMPARISON:  04/02/2012.  FINDINGS: Changes from CABG surgery are stable. Cardiac silhouette is normal in size and configuration. Normal mediastinal and hilar contours. Clear lungs. No pleural effusion or pneumothorax.  The bony thorax is intact.  IMPRESSION: No acute cardiopulmonary disease.   Electronically Signed   By: Lajean Manes M.D.   On: 06/18/2014 17:09    Will have my nurse inform pt that CXR was normal.  No change to current tx plan from 06/18/14.

## 2014-06-22 NOTE — Telephone Encounter (Signed)
Okay to change to ventolin hfa 2 puffs q6h prn, dispense 1 inhaler with 5 refills.

## 2014-06-22 NOTE — Telephone Encounter (Signed)
ATC pt x2 - operator stated "your call cannot be completed as dialed.  Please try again later" and went to fast-busy signal.  WCB.

## 2014-06-22 NOTE — Telephone Encounter (Signed)
LMTCB

## 2014-06-23 MED ORDER — ALBUTEROL SULFATE HFA 108 (90 BASE) MCG/ACT IN AERS: 2.0000 | INHALATION_SPRAY | Freq: Four times a day (QID) | RESPIRATORY_TRACT | Status: DC | PRN

## 2014-06-23 NOTE — Telephone Encounter (Signed)
lmomtcb for pt - which pharm whould pt like rx sent to?

## 2014-06-23 NOTE — Telephone Encounter (Signed)
Pt called back. RX sent to [facility]. Nothing further needed

## 2014-06-23 NOTE — Telephone Encounter (Signed)
I spoke with patient about results and she verbalized understanding and had no questions 

## 2014-06-29 ENCOUNTER — Telehealth: Payer: Self-pay | Admitting: Neurology

## 2014-06-29 NOTE — Telephone Encounter (Signed)
Pt called f/u on the injection she was suppose to have on her neck. C/B [phone]

## 2014-06-30 ENCOUNTER — Ambulatory Visit (INDEPENDENT_AMBULATORY_CARE_PROVIDER_SITE_OTHER): Payer: Medicare Other | Admitting: Pulmonary Disease

## 2014-06-30 DIAGNOSIS — J411 Mucopurulent chronic bronchitis: Secondary | ICD-10-CM

## 2014-06-30 LAB — PULMONARY FUNCTION TEST
DL/VA % pred: 83 %
DL/VA: 3.11 ml/min/mmHg/L
DLCO unc % pred: 82 %
DLCO unc: 12.27 ml/min/mmHg
FEF 25-75 Post: 2.19 L/sec
FEF 25-75 Pre: 2.14 L/sec
FEF2575-%Change-Post: 2 %
FEF2575-%Pred-Post: 112 %
FEF2575-%Pred-Pre: 109 %
FEV1-%Change-Post: 0 %
FEV1-%Pred-Post: 93 %
FEV1-%Pred-Pre: 93 %
FEV1-Post: 1.8 L
FEV1-Pre: 1.8 L
FEV1FVC-%Change-Post: 2 %
FEV1FVC-%Pred-Pre: 107 %
FEV6-%Change-Post: -2 %
FEV6-%Pred-Post: 87 %
FEV6-%Pred-Pre: 89 %
FEV6-Post: 2.08 L
FEV6-Pre: 2.14 L
FEV6FVC-%Change-Post: 0 %
FEV6FVC-%Pred-Post: 103 %
FEV6FVC-%Pred-Pre: 104 %
FVC-%Change-Post: -2 %
FVC-%Pred-Post: 83 %
FVC-%Pred-Pre: 85 %
FVC-Post: 2.09 L
FVC-Pre: 2.14 L
Post FEV1/FVC ratio: 86 %
Post FEV6/FVC ratio: 100 %
Pre FEV1/FVC ratio: 84 %
Pre FEV6/FVC Ratio: 100 %
RV % pred: 133 %
RV: 2.18 L
TLC % pred: 108 %
TLC: 4.35 L

## 2014-06-30 NOTE — Telephone Encounter (Signed)
Patient is aware that at this time we cannot make referral for injection for her pain due to [facility] ACCESS

## 2014-06-30 NOTE — Telephone Encounter (Signed)
Pt is returning your call please call [phone]

## 2014-06-30 NOTE — Progress Notes (Signed)
PFT done today. 

## 2014-06-30 NOTE — Telephone Encounter (Signed)
I left patient a voicemail that [facility] does not have a PCP listed for her so until that is gotten I am unable to schedule a appt for injections

## 2014-07-01 ENCOUNTER — Telehealth: Payer: Self-pay | Admitting: Pulmonary Disease

## 2014-07-01 NOTE — Telephone Encounter (Signed)
PFT 06/30/14 >> FEV1 1.80 (93%), FEV1% 86, TLC 4.35 (108%), DLCO 82%, no BD  Will have my nurse inform pt that PFT was normal.  Will discuss in more detail at next visit.

## 2014-07-01 NOTE — Telephone Encounter (Signed)
I spoke with patient about results and she verbalized understanding and had no questions 

## 2014-07-15 ENCOUNTER — Ambulatory Visit: Payer: Self-pay | Admitting: Adult Health

## 2014-07-16 ENCOUNTER — Ambulatory Visit: Payer: Self-pay | Admitting: Adult Health

## 2014-08-02 ENCOUNTER — Ambulatory Visit: Payer: Self-pay | Admitting: Adult Health

## 2014-08-02 ENCOUNTER — Encounter: Payer: Self-pay | Admitting: Pulmonary Disease

## 2014-08-12 DIAGNOSIS — M797 Fibromyalgia: Secondary | ICD-10-CM | POA: Diagnosis not present

## 2014-08-12 DIAGNOSIS — M5030 Other cervical disc degeneration, unspecified cervical region: Secondary | ICD-10-CM | POA: Diagnosis not present

## 2014-08-12 DIAGNOSIS — M25561 Pain in right knee: Secondary | ICD-10-CM | POA: Diagnosis not present

## 2014-08-12 DIAGNOSIS — M65342 Trigger finger, left ring finger: Secondary | ICD-10-CM | POA: Diagnosis not present

## 2014-08-18 ENCOUNTER — Encounter: Payer: Self-pay | Admitting: Neurology

## 2014-08-18 ENCOUNTER — Ambulatory Visit (INDEPENDENT_AMBULATORY_CARE_PROVIDER_SITE_OTHER): Payer: Medicare Other | Admitting: Neurology

## 2014-08-18 VITALS — BP 102/68 | HR 66 | Temp 97.9°F | Resp 16 | Ht <= 58 in | Wt 153.3 lb

## 2014-08-18 DIAGNOSIS — M5010 Cervical disc disorder with radiculopathy, unspecified cervical region: Secondary | ICD-10-CM | POA: Diagnosis not present

## 2014-08-18 DIAGNOSIS — G40209 Localization-related (focal) (partial) symptomatic epilepsy and epileptic syndromes with complex partial seizures, not intractable, without status epilepticus: Secondary | ICD-10-CM

## 2014-08-18 DIAGNOSIS — I2510 Atherosclerotic heart disease of native coronary artery without angina pectoris: Secondary | ICD-10-CM | POA: Diagnosis not present

## 2014-08-18 NOTE — Progress Notes (Signed)
NEUROLOGY FOLLOW UP OFFICE NOTE  Diane [name] [id number]  HISTORY OF PRESENT ILLNESS: Diane [name] is a 58 year old right-handed woman with CAD s/p CABG, hypertension, hyperlipidemia, type II diabetes mellitus, tobacco use, stroke, depression, fibromyalgia, migraine, OSA and COPD who follows up for neck pain and seizures.  Records, labs and MRI personally reviewed.     UPDATE: Seizure disorder: She is doing well. Medications:  Dilantin ER 100mg  AM & 100mg  HS (she was supposed to take 200mg  at bedtime), Keppra 750mg  twice daily, calcium 1500mg  daily, vitamin D 1000 IU daily Last spell:  03/18/13.  Neck pain: She was started on gabapentin 300mg  three times daily.  MRI of the cervical spine performed on 05/23/14 showed moderately large foraminal disc protrusion and associated osteophyte at C3-4, causing right foraminal encroachment.  Solid fusion C5 through C7 noted.  She was referred for right C4 nerve root injection, however it was not done.  The pain is better on the gabapentin, anyway.  Since her stroke, she has been unsteady but feels a little more unsteady and weak in the legs.  She has low-back pain, due to arthritis, but no pain in the legs.    September labs:  LDL 79, HGB A1C 8, Na 132 but otherwise CMP unremarkable.  HISTORY: She was admitted to [facility] from 03/03/13 to 03/06/13 for recurrent seizure-like episodes.  These spells are were witnessed by Dr. Wallie Char, neuro-hospitalist, and described as "head turning to the right as well as eye deviation to the right, stiffening of right extremities with flexion of right upper extremity, and convulsive movements of right extremities. Spell lasted about 40-50 seconds and resolved without intervention. Within 30 seconds patient was again awake and conversant. She was slightly confused with respect to time."  She is unable to speak during these events.  Sometimes she was aware during these events and other times she was  unaware and amnestic.  She did have urinary incontinence but no tongue biting.  It is reported that she can write down what she wants to say during these spells.  Given her stroke history, she was evaluatedfor possible left hemispheric stroke.  By his assessment, it seemed that her speech difficulties were non-organic, seizure-like activity non-organic, and he did not suspect acute stroke.    She had continuous EEG monitoring, which captured multiple spells without electrographic correlate, however there are no details describing these events captured in the hospital notes.   However, she did have one captured clinical event, described as "build up to rhythmical theta activity and resultant slowing companied by clinical changes.  This may very well represent epileptiform activity.  No interictal abnormalities were noted".  Psychiatry was consulted, regarding possible pseudoseizures.  Despite the possible diagnosis of pseudoseizures, the neuro-hospitalists never made a sole diagnosis of pseudoseizures in the hospital notes.    MRI of brain performed on 03/03/13, which reported possible tiny acute infarct in the posterior left frontal lobe, but neurology believed this to be artifact.  There was also mention of incidental tiny cysts in right hippocampal region.  Encephalomalacia in the right frontoparietal region, from prior remote stroke, is seen.    I personally reviewed the EEG from June.  At 1:57:48, patient pauses, head slightly turned left, staring, with little movement and no convulsions.  She does pull at the covers a little, but no appreciable stereotypical hand movements.  When it is over, she begins moving around again.  Electrographically, she does have a build up  of generalized theta activity up until 1:58:26, followed by generalized delta slowing lasting until 1:58:45.  Difficult to lateralize, but may be more prominent in the left hemisphere.  However, this appears to only be a clip of the event and  the rest of the study is not available to me.  Therefore I cannot comment on the other captured spells.  She was initially on Keppra 750mg  BID alone, but spells persisted.  Dilantin ER 100mg  TID was started and the spells stopped.  She has not had any further spells since hospital discharge on 03/18/13.    In March, she moved and was doing some lifting.  Since April, she has right sided neck pain radiating down to the right shoulder, associated with tingling from the right ear down to the shoulder.  Hot and cold compresses help too.  No numbness, pain or weakness down the arm.  Pain worse when turning neck to the left.  She has trigger finger and numbness in the fingers when driving or picking up objects, thought to be carpal tunnel syndrome.  She has history of cervical fusion in the 1980s.  PT was ineffective.  PAST MEDICAL HISTORY: Past Medical History  Diagnosis Date  . Coronary artery disease     post CABG in 3/07   . Hypertension   . Hyperlipidemia   . Dyslipidemia   . Diabetes mellitus     type 2  . Chronic bronchitis   . CVA (cerebral infarction) 1993  . Tobacco abuse   . COPD (chronic obstructive pulmonary disease)   . Stroke 1993  . Seizures   . Carotid stenosis   . Systolic murmur     known mild AS and MR    MEDICATIONS: Current Outpatient Prescriptions on File Prior to Visit  Medication Sig Dispense Refill  . albuterol (PROVENTIL HFA;VENTOLIN HFA) 108 (90 BASE) MCG/ACT inhaler Inhale 2 puffs into the lungs every 6 (six) hours as needed for wheezing or shortness of breath. 1 Inhaler 5  . albuterol (PROVENTIL) (2.5 MG/3ML) 0.083% nebulizer solution Take 3 mLs (2.5 mg total) by nebulization every 6 (six) hours as needed. 360 mL 3  . Albuterol Sulfate (PROAIR RESPICLICK) 353 (90 BASE) MCG/ACT AEPB Inhale 2 puffs into the lungs every 6 (six) hours as needed. 1 each 5  . aspirin 325 MG tablet Take 1 tablet (325 mg total) by mouth daily. 90 tablet 3  . Calcium 1500 MG tablet  Take 1 tablet (1,500 mg total) by mouth daily. 30 tablet 0  . carvedilol (COREG) 12.5 MG tablet Take 1 tablet (12.5 mg total) by mouth 2 (two) times daily with a meal. 180 tablet 3  . cetirizine (ZYRTEC) 10 MG tablet Take 1 tablet (10 mg total) by mouth daily. 90 tablet 3  . Cholecalciferol ([employer] VITAMIN D3) 1000 UNITS capsule Take 1 capsule (1,000 Units total) by mouth daily. 30 capsule 0  . clopidogrel (PLAVIX) 75 MG tablet Take 1 tablet (75 mg total) by mouth daily. 90 tablet 3  . dexlansoprazole (DEXILANT) 60 MG capsule TAKE 1 CAPSULE (60 MG TOTAL) BY MOUTH DAILY. 30 capsule 5  . diphenhydrAMINE (BENADRYL) 25 mg capsule Take 2 capsules in AM and 1 capsule at night 270 capsule 3  . docusate sodium (COLACE) 100 MG capsule Take 1 capsule (100 mg total) by mouth 2 (two) times daily. 180 capsule 3  . DULoxetine (CYMBALTA) 60 MG capsule     . fluticasone (FLONASE) 50 MCG/ACT nasal spray Place 2 sprays into both  nostrils daily. 16 g 2  . gabapentin (NEURONTIN) 300 MG capsule Take 1 capsule (300 mg total) by mouth 3 (three) times daily. 90 capsule 2  . glipiZIDE-metformin (METAGLIP) 2.5-500 MG per tablet     . guaiFENesin (MUCINEX) 600 MG 12 hr tablet Take 2 tablets (1,200 mg total) by mouth 2 (two) times daily. 360 tablet 3  . hydrochlorothiazide (HYDRODIURIL) 50 MG tablet Take 1 tablet (50 mg total) by mouth daily. 90 tablet 3  . isosorbide mononitrate (IMDUR) 30 MG 24 hr tablet Take 1 tablet (30 mg total) by mouth 2 (two) times daily. 180 tablet 3  . levETIRAcetam (KEPPRA) 750 MG tablet Take 1 tablet (750 mg total) by mouth 2 (two) times daily. 60 tablet 11  . LORazepam (ATIVAN) 0.5 MG tablet Take 0.25 mg by mouth every 6 (six) hours as needed for anxiety.    [name] losartan (COZAAR) 100 MG tablet Take 1 tablet (100 mg total) by mouth daily. 90 tablet 3  . nitroGLYCERIN (NITROSTAT) 0.4 MG SL tablet Place 1 tablet (0.4 mg total) under the tongue every 5 (five) minutes as needed. 25 tablet 11  . Omega-3  Fatty Acids ([employer] NATURAL FISH OIL) 1200 MG CAPS Take 1 capsule by mouth 2 (two) times daily. 180 capsule 3  . phenytoin (DILANTIN) 100 MG ER capsule Take 100 mg by mouth 2 (two) times daily.    . potassium chloride (K-DUR) 10 MEQ tablet Take 1 tablet (10 mEq total) by mouth 2 (two) times daily. 180 tablet 3  . ranolazine (RANEXA) 500 MG 12 hr tablet Take 1 tablet (500 mg total) by mouth 2 (two) times daily. 180 tablet 3  . rosuvastatin (CRESTOR) 10 MG tablet Take 1 tablet (10 mg total) by mouth daily. 90 tablet 3   No current facility-administered medications on file prior to visit.    ALLERGIES: Allergies  Allergen Reactions  . Ace Inhibitors     REACTION: cough  . Bee Venom   . Eggs Or Egg-Derived Products     Abdominal discomfort-no allergy She can get a flu shot   . Tetracycline     REACTION: reaction not known    FAMILY HISTORY: Family History  Problem Relation Age of Onset  . Coronary artery disease    . Heart disease    . Heart disease Mother   . Diabetes Mother   . COPD Mother   . Hyperlipidemia Mother   . Hypertension Mother   . Cancer Father   . Drug abuse Paternal Grandmother   . Stroke Paternal Grandfather     SOCIAL HISTORY: History   Social History  . Marital Status: Divorced    Spouse Name: N/A    Number of Children: N/A  . Years of Education: N/A   Occupational History  . Not on file.   Social History Main Topics  . Smoking status: Current Every Day Smoker -- 1.50 packs/day for 36 years    Types: Cigarettes  . Smokeless tobacco: Never Used     Comment: 1.5-2 ppd  patien is aware that she needs to quit   . Alcohol Use: No  . Drug Use: No  . Sexual Activity:    Partners: Male    Birth Control/ Protection: None   Other Topics Concern  . Not on file   Social History Narrative    REVIEW OF SYSTEMS: Constitutional: No fevers, chills, or sweats, no generalized fatigue, change in appetite Eyes: No visual changes, double vision, eye  pain Ear,  nose and throat: No hearing loss, ear pain, nasal congestion, sore throat Cardiovascular: No chest pain, palpitations Respiratory:  No shortness of breath at rest or with exertion, wheezes GastrointestinaI: No nausea, vomiting, diarrhea, abdominal pain, fecal incontinence Genitourinary:  No dysuria, urinary retention or frequency Musculoskeletal:  No neck pain, back pain Integumentary: No rash, pruritus, skin lesions Neurological: as above Psychiatric: No depression, insomnia, anxiety Endocrine: No palpitations, fatigue, diaphoresis, mood swings, change in appetite, change in weight, increased thirst Hematologic/Lymphatic:  No anemia, purpura, petechiae. Allergic/Immunologic: no itchy/runny eyes, nasal congestion, recent allergic reactions, rashes  PHYSICAL EXAM: Filed Vitals:   08/18/14 1424  BP: 102/68  Pulse: 66  Temp: 97.9 F (36.6 C)  Resp: 16   General: No acute distress Head:  Normocephalic/atraumatic Eyes:  Fundoscopic exam unremarkable without vessel changes, exudates, hemorrhages or papilledema. Neck: supple, no paraspinal tenderness, full range of motion Heart:  Regular rate and rhythm Lungs:  Clear to auscultation bilaterally Back: No paraspinal tenderness Neurological Exam: alert and oriented to person, place, and time. Attention span and concentration intact, recent and remote memory intact, fund of knowledge intact. Speech fluent and not dysarthric, language intact. CN II-XII intact. Fundoscopic exam unremarkable without vessel changes, exudates, hemorrhages or papilledema. Bulk and tone normal, muscle strength 5/5 throughout. Sensation to light touch, temperature and vibration intact. Deep tendon reflexes 2+ throughout, toes downgoing. Finger to nose and heel to shin testing intact. Gait normal  IMPRESSION: Seizure disorder, stable Cervical radiculopathy, stable History of CVA  PLAN: Continue Keppra 750mg  twice daily and Dilantin ER 100mg  twice  daily with calcium 600mg  twice daily and vitamin D 800 units daily Gabapentin 300mg  three times daily for cervical radicular pain. ASA and smoking cessation for secondary stroke prevention Follow up in 6 months  Metta Clines, DO  CC:  Loura Pardon, MD

## 2014-08-18 NOTE — Patient Instructions (Signed)
1.  Continue Dilantin ER 100mg  twice daily and Keppra 750mg  twice daily.  Take calcium 1500mg  daily and vitamin D 1000 units daily 2.  Continue gabapentin 300mg  three times daily for radicular neck pain. 3.  Follow up in 6 months or as needed.

## 2014-08-19 ENCOUNTER — Other Ambulatory Visit: Payer: Self-pay | Admitting: Neurology

## 2014-08-29 ENCOUNTER — Other Ambulatory Visit: Payer: Self-pay | Admitting: Family Medicine

## 2014-09-16 ENCOUNTER — Ambulatory Visit (INDEPENDENT_AMBULATORY_CARE_PROVIDER_SITE_OTHER): Payer: Medicare Other | Admitting: Internal Medicine

## 2014-09-16 ENCOUNTER — Encounter: Payer: Self-pay | Admitting: Internal Medicine

## 2014-09-16 VITALS — BP 98/52 | HR 76 | Temp 97.6°F | Wt 151.5 lb

## 2014-09-16 DIAGNOSIS — I2510 Atherosclerotic heart disease of native coronary artery without angina pectoris: Secondary | ICD-10-CM | POA: Diagnosis not present

## 2014-09-16 DIAGNOSIS — J441 Chronic obstructive pulmonary disease with (acute) exacerbation: Secondary | ICD-10-CM | POA: Diagnosis not present

## 2014-09-16 MED ORDER — HYDROCODONE-HOMATROPINE 5-1.5 MG/5ML PO SYRP: 5.0000 mL | ORAL_SOLUTION | Freq: Three times a day (TID) | ORAL | Status: DC | PRN

## 2014-09-16 MED ORDER — PREDNISONE 10 MG PO TABS: ORAL_TABLET | ORAL | Status: DC

## 2014-09-16 MED ORDER — LEVOFLOXACIN 500 MG PO TABS: 500.0000 mg | ORAL_TABLET | Freq: Every day | ORAL | Status: DC

## 2014-09-16 NOTE — Progress Notes (Signed)
HPI  Pt presents to the clinic today with c/o cough, shortness of breath, fever, chills and body aches. She reports this started 5 days ago. The cough is productive at times of white mucous. It seems to be worse at night. She has run fevers up to 102. She does have COPD. She is UTD on her flu and pneumonia vaccines. She has not had sick contacts that she is aware of. She does continue to smoke.  Review of Systems      Past Medical History  Diagnosis Date  . Coronary artery disease     post CABG in 3/07   . Hypertension   . Hyperlipidemia   . Dyslipidemia   . Diabetes mellitus     type 2  . Chronic bronchitis   . CVA (cerebral infarction) 1993  . Tobacco abuse   . COPD (chronic obstructive pulmonary disease)   . Stroke 1993  . Seizures   . Carotid stenosis   . Systolic murmur     known mild AS and MR    Family History  Problem Relation Age of Onset  . Coronary artery disease    . Heart disease    . Heart disease Mother   . Diabetes Mother   . COPD Mother   . Hyperlipidemia Mother   . Hypertension Mother   . Cancer Father   . Drug abuse Paternal Grandmother   . Stroke Paternal Grandfather     History   Social History  . Marital Status: Divorced    Spouse Name: N/A    Number of Children: N/A  . Years of Education: N/A   Occupational History  . Not on file.   Social History Main Topics  . Smoking status: Current Every Day Smoker -- 1.50 packs/day for 36 years    Types: Cigarettes  . Smokeless tobacco: Never Used     Comment: 1.5-2 ppd  patien is aware that she needs to quit   . Alcohol Use: No  . Drug Use: No  . Sexual Activity:    Partners: Male    Birth Control/ Protection: None   Other Topics Concern  . Not on file   Social History Narrative    Allergies  Allergen Reactions  . Ace Inhibitors     REACTION: cough  . Bee Venom   . Eggs Or Egg-Derived Products     Abdominal discomfort-no allergy She can get a flu shot   . Tetracycline    REACTION: reaction not known     Constitutional: Positive headache, fatigue and fever. Denies abrupt weight changes.  HEENT:  Denies eye redness, eye pain, pressure behind the eyes, facial pain, nasal congestion, ear pain, ringing in the ears, wax buildup, runny nose or sore throat. Respiratory: Positive cough and shortness of breath. Denies difficulty breathing or shortness of breath.  Cardiovascular: Denies chest pain, chest tightness, palpitations or swelling in the hands or feet.   No other specific complaints in a complete review of systems (except as listed in HPI above).  Objective:   BP 98/52 mmHg  Pulse 76  Temp(Src) 97.6 F (36.4 C) (Oral)  Wt 151 lb 8 oz (68.72 kg)  LMP 09/02/2003 Wt Readings from Last 3 Encounters:  09/16/14 151 lb 8 oz (68.72 kg)  08/18/14 153 lb 4.8 oz (69.536 kg)  06/18/14 151 lb 3.2 oz (68.584 kg)     General: Appears her stated age, ill appearing, in NAD. HEENT: Head: normal shape and size, no sinus tenderness noted;  Eyes: sclera white, no icterus, conjunctiva pink; Ears: Tm's gray and intact, normal light reflex; Throat/Mouth:  mucosa erythematous and moist, no exudate noted, no lesions or ulcerations noted.  Neck: No adenopathy noted. Cardiovascular: Normal rate and rhythm. S1,S2 noted.  No murmur, rubs or gallops noted.  Pulmonary/Chest: Normal effort and scattered rhonchi and bilateral inspiratory and expiratory wheezing noted. No respiratory distress.      Assessment & Plan:  COPD exacerbation:  Get some rest and drink plenty of water eRx for Pred taper x 6 days eRx for Levquin x 7 days eRx for Hycodan cough syrup Continue inhalers as prescribed Nebulizer prn if needed  RTC as needed or if symptoms persist.

## 2014-09-16 NOTE — Patient Instructions (Signed)

## 2014-09-16 NOTE — Progress Notes (Signed)
Pre visit review using our clinic review tool, if applicable. No additional management support is needed unless otherwise documented below in the visit note. 

## 2014-09-17 ENCOUNTER — Ambulatory Visit: Payer: Self-pay | Admitting: Neurology

## 2014-09-20 ENCOUNTER — Other Ambulatory Visit: Payer: Self-pay | Admitting: Family Medicine

## 2014-09-20 ENCOUNTER — Other Ambulatory Visit: Payer: Self-pay | Admitting: Neurology

## 2014-09-20 ENCOUNTER — Other Ambulatory Visit: Payer: Self-pay | Admitting: Pulmonary Disease

## 2014-09-23 ENCOUNTER — Other Ambulatory Visit: Payer: Self-pay | Admitting: *Deleted

## 2014-10-01 HISTORY — PX: BREAST BIOPSY: SHX20

## 2014-11-14 ENCOUNTER — Other Ambulatory Visit: Payer: Self-pay | Admitting: Neurology

## 2015-01-31 DIAGNOSIS — E041 Nontoxic single thyroid nodule: Secondary | ICD-10-CM | POA: Diagnosis not present

## 2015-01-31 DIAGNOSIS — E119 Type 2 diabetes mellitus without complications: Secondary | ICD-10-CM | POA: Diagnosis not present

## 2015-02-10 DIAGNOSIS — E119 Type 2 diabetes mellitus without complications: Secondary | ICD-10-CM | POA: Diagnosis not present

## 2015-02-10 DIAGNOSIS — E780 Pure hypercholesterolemia: Secondary | ICD-10-CM | POA: Diagnosis not present

## 2015-02-10 DIAGNOSIS — E041 Nontoxic single thyroid nodule: Secondary | ICD-10-CM | POA: Diagnosis not present

## 2015-02-10 DIAGNOSIS — I10 Essential (primary) hypertension: Secondary | ICD-10-CM | POA: Diagnosis not present

## 2015-02-13 ENCOUNTER — Other Ambulatory Visit: Payer: Self-pay | Admitting: Neurology

## 2015-02-13 ENCOUNTER — Other Ambulatory Visit: Payer: Self-pay | Admitting: Family Medicine

## 2015-02-14 ENCOUNTER — Other Ambulatory Visit: Payer: Self-pay | Admitting: *Deleted

## 2015-02-14 DIAGNOSIS — M65342 Trigger finger, left ring finger: Secondary | ICD-10-CM | POA: Diagnosis not present

## 2015-02-14 DIAGNOSIS — M533 Sacrococcygeal disorders, not elsewhere classified: Secondary | ICD-10-CM | POA: Diagnosis not present

## 2015-02-14 DIAGNOSIS — M542 Cervicalgia: Secondary | ICD-10-CM | POA: Diagnosis not present

## 2015-02-14 DIAGNOSIS — M797 Fibromyalgia: Secondary | ICD-10-CM | POA: Diagnosis not present

## 2015-02-14 MED ORDER — CLOPIDOGREL BISULFATE 75 MG PO TABS: 75.0000 mg | ORAL_TABLET | Freq: Every day | ORAL | Status: DC

## 2015-02-14 MED ORDER — RANOLAZINE ER 500 MG PO TB12: 500.0000 mg | ORAL_TABLET | Freq: Two times a day (BID) | ORAL | Status: DC

## 2015-02-14 MED ORDER — HYDROCHLOROTHIAZIDE 50 MG PO TABS: 50.0000 mg | ORAL_TABLET | Freq: Every day | ORAL | Status: DC

## 2015-02-16 ENCOUNTER — Ambulatory Visit: Payer: Self-pay | Admitting: Neurology

## 2015-02-25 ENCOUNTER — Ambulatory Visit: Payer: Self-pay | Admitting: Neurology

## 2015-03-02 ENCOUNTER — Other Ambulatory Visit: Payer: Self-pay

## 2015-03-04 ENCOUNTER — Other Ambulatory Visit: Payer: Self-pay | Admitting: Family Medicine

## 2015-03-04 ENCOUNTER — Other Ambulatory Visit: Payer: Self-pay | Admitting: *Deleted

## 2015-03-04 ENCOUNTER — Other Ambulatory Visit: Payer: Self-pay | Admitting: Neurology

## 2015-03-04 MED ORDER — RANOLAZINE ER 500 MG PO TB12: 500.0000 mg | ORAL_TABLET | Freq: Two times a day (BID) | ORAL | Status: DC

## 2015-03-04 MED ORDER — HYDROCHLOROTHIAZIDE 50 MG PO TABS: 50.0000 mg | ORAL_TABLET | Freq: Every day | ORAL | Status: DC

## 2015-03-04 MED ORDER — CLOPIDOGREL BISULFATE 75 MG PO TABS: 75.0000 mg | ORAL_TABLET | Freq: Every day | ORAL | Status: DC

## 2015-03-04 NOTE — Telephone Encounter (Signed)
Rx(s) sent to pharmacy electronically. Patient has OV 04/13/15

## 2015-03-04 NOTE — Telephone Encounter (Signed)
Rx sent 

## 2015-03-22 ENCOUNTER — Other Ambulatory Visit: Payer: Medicare Other

## 2015-03-22 ENCOUNTER — Ambulatory Visit (INDEPENDENT_AMBULATORY_CARE_PROVIDER_SITE_OTHER): Payer: Medicare Other | Admitting: Neurology

## 2015-03-22 ENCOUNTER — Encounter: Payer: Self-pay | Admitting: Neurology

## 2015-03-22 VITALS — BP 100/60 | HR 80 | Ht <= 58 in | Wt 146.4 lb

## 2015-03-22 DIAGNOSIS — M5010 Cervical disc disorder with radiculopathy, unspecified cervical region: Secondary | ICD-10-CM

## 2015-03-22 DIAGNOSIS — G40009 Localization-related (focal) (partial) idiopathic epilepsy and epileptic syndromes with seizures of localized onset, not intractable, without status epilepticus: Secondary | ICD-10-CM

## 2015-03-22 DIAGNOSIS — I679 Cerebrovascular disease, unspecified: Secondary | ICD-10-CM

## 2015-03-22 LAB — COMPLETE METABOLIC PANEL WITH GFR
ALT: 11 U/L (ref 0–35)
AST: 12 U/L (ref 0–37)
Albumin: 4.2 g/dL (ref 3.5–5.2)
Alkaline Phosphatase: 69 U/L (ref 39–117)
BUN: 14 mg/dL (ref 6–23)
CO2: 26 mEq/L (ref 19–32)
Calcium: 9.1 mg/dL (ref 8.4–10.5)
Chloride: 92 mEq/L — ABNORMAL LOW (ref 96–112)
Creat: 0.74 mg/dL (ref 0.50–1.10)
GFR, Est African American: >89 mL/min
GFR, Est Non African American: 88 mL/min
Glucose, Bld: 138 mg/dL — ABNORMAL HIGH (ref 70–99)
Potassium: 3.7 mEq/L (ref 3.5–5.3)
Sodium: 132 mEq/L — ABNORMAL LOW (ref 135–145)
Total Bilirubin: 0.4 mg/dL (ref 0.2–1.2)
Total Protein: 6.7 g/dL (ref 6.0–8.3)

## 2015-03-22 LAB — CBC
HCT: 36.9 % (ref 36.0–46.0)
Hemoglobin: 11.7 g/dL — ABNORMAL LOW (ref 12.0–15.0)
MCH: 21.9 pg — ABNORMAL LOW (ref 26.0–34.0)
MCHC: 31.7 g/dL (ref 30.0–36.0)
MCV: 69.1 fL — ABNORMAL LOW (ref 78.0–100.0)
MPV: 8.5 fL — ABNORMAL LOW (ref 8.6–12.4)
Platelets: 285 10*3/uL (ref 150–400)
RBC: 5.34 MIL/uL — ABNORMAL HIGH (ref 3.87–5.11)
RDW: 18.7 % — ABNORMAL HIGH (ref 11.5–15.5)
WBC: 6.4 10*3/uL (ref 4.0–10.5)

## 2015-03-22 NOTE — Patient Instructions (Signed)
1.  Continue Keppra '750mg'$  twice daily, Dilantin ER '100mg'$  twice daily, and gabapentin '600mg'$  at bedtime. 2.  Take calcium '1500mg'$  daily and vitamin D 1000 units daily. 3.  Check CBC and CMP.  Repeat in one year prior to follow up 4.  Quit smoking 5.  When you drool, look in mirror to see if you have facial droop 6.  Follow up in one year

## 2015-03-22 NOTE — Progress Notes (Signed)
NEUROLOGY FOLLOW UP OFFICE NOTE  Diane [name] [id number]  HISTORY OF PRESENT ILLNESS: Diane [name] is a 59 year old right-handed woman with CAD s/p CABG, hypertension, hyperlipidemia, type II diabetes mellitus, tobacco use, stroke, depression, fibromyalgia, migraine, OSA and COPD who follows up for neck pain and seizures.     UPDATE: Seizure disorder: She is doing well. Medications:  Dilantin ER '100mg'$  twice daily, Keppra '750mg'$  twice daily, calcium '1500mg'$  daily, vitamin D 1000 IU daily Last spell:  03/18/13.  Neck pain: She is taking gabapentin '600mg'$  at bedtime.  From time to time, she says that she drools.  She will note that the side of her mouth is wet.  She hasn't looked for any facial droop   HISTORY: She was admitted to [facility] from 03/03/13 to 03/06/13 for recurrent seizure-like episodes.  These spells are were witnessed by Dr. Wallie Char, neuro-hospitalist, and described as "head turning to the right as well as eye deviation to the right, stiffening of right extremities with flexion of right upper extremity, and convulsive movements of right extremities. Spell lasted about 40-50 seconds and resolved without intervention. Within 30 seconds patient was again awake and conversant. She was slightly confused with respect to time."  She is unable to speak during these events.  Sometimes she was aware during these events and other times she was unaware and amnestic.  She did have urinary incontinence but no tongue biting.  It is reported that she can write down what she wants to say during these spells.  Given her stroke history, she was evaluatedfor possible left hemispheric stroke.  By his assessment, it seemed that her speech difficulties were non-organic, seizure-like activity non-organic, and he did not suspect acute stroke.    She had continuous EEG monitoring, which captured multiple spells without electrographic correlate, however there are no details describing these  events captured in the hospital notes.   However, she did have one captured clinical event, described as "build up to rhythmical theta activity and resultant slowing companied by clinical changes.  This may very well represent epileptiform activity.  No interictal abnormalities were noted".  Psychiatry was consulted, regarding possible pseudoseizures.  Despite the possible diagnosis of pseudoseizures, the neuro-hospitalists never made a sole diagnosis of pseudoseizures in the hospital notes.    I personally reviewed the EEG.  At 1:57:48, patient pauses, head slightly turned left, staring, with little movement and no convulsions.  She does pull at the covers a little, but no appreciable stereotypical hand movements.  When it is over, she begins moving around again.  Electrographically, she does have a build up of generalized theta activity up until 1:58:26, followed by generalized delta slowing lasting until 1:58:45.  Difficult to lateralize, but may be more prominent in the left hemisphere.  However, this appears to only be a clip of the event and the rest of the study is not available to me.  Therefore I cannot comment on the other captured spells.  MRI of brain performed on 03/03/13, which reported possible tiny acute infarct in the posterior left frontal lobe, but neurology believed this to be artifact.  There was also mention of incidental tiny cysts in right hippocampal region.  Encephalomalacia in the right frontoparietal region, from prior remote stroke, is seen.    Since last year, she has right sided neck pain radiating down to the right shoulder, associated with tingling from the right ear down to the shoulder.  Hot and cold compresses help too.  No numbness, pain or weakness down the arm.  Pain worse when turning neck to the left.  She has trigger finger and numbness in the fingers when driving or picking up objects, thought to be carpal tunnel syndrome.  She has history of cervical fusion in the  1980s.  PT was ineffective.  [name]  MRI of the cervical spine performed on 05/23/14 showed moderately large foraminal disc protrusion and associated osteophyte at C3-4, causing right foraminal encroachment.  Solid fusion C5 through C7 noted.  She was referred for right C4 nerve root injection, however it was not done.    PAST MEDICAL HISTORY: Past Medical History  Diagnosis Date  . Coronary artery disease     post CABG in 3/07   . Hypertension   . Hyperlipidemia   . Dyslipidemia   . Diabetes mellitus     type 2  . Chronic bronchitis   . CVA (cerebral infarction) 1993  . Tobacco abuse   . COPD (chronic obstructive pulmonary disease)   . Stroke 1993  . Seizures   . Carotid stenosis   . Systolic murmur     known mild AS and MR    MEDICATIONS: Current Outpatient Prescriptions on File Prior to Visit  Medication Sig Dispense Refill  . albuterol (PROVENTIL HFA;VENTOLIN HFA) 108 (90 BASE) MCG/ACT inhaler Inhale 2 puffs into the lungs every 6 (six) hours as needed for wheezing or shortness of breath. 1 Inhaler 5  . albuterol (PROVENTIL) (2.5 MG/3ML) 0.083% nebulizer solution USE 1 VIAL PER NEBULIZRE EVERY 6 HOURS AS NEEDED *HOSPITALIST DOESNT DO PA 75 mL 0  . aspirin 325 MG tablet Take 1 tablet (325 mg total) by mouth daily. 90 tablet 3  . Calcium 1500 MG tablet Take 1 tablet (1,500 mg total) by mouth daily. 30 tablet 0  . carvedilol (COREG) 12.5 MG tablet Take 1 tablet (12.5 mg total) by mouth 2 (two) times daily with a meal. 180 tablet 3  . cetirizine (ZYRTEC) 10 MG tablet Take 1 tablet (10 mg total) by mouth daily. 90 tablet 3  . Cholecalciferol ([facility] VITAMIN D3) 1000 UNITS capsule Take 1 capsule (1,000 Units total) by mouth daily. 30 capsule 0  . clopidogrel (PLAVIX) 75 MG tablet Take 1 tablet (75 mg total) by mouth daily. 90 tablet 0  . cyclobenzaprine (FLEXERIL) 10 MG tablet     . DEXILANT 60 MG capsule TAKE 1 CAPSULE (60 MG TOTAL) BY MOUTH DAILY. 30 capsule 5  . diphenhydrAMINE (BENADRYL)  25 mg capsule Take 2 capsules in AM and 1 capsule at night 270 capsule 3  . DULoxetine (CYMBALTA) 60 MG capsule     . fluticasone (FLONASE) 50 MCG/ACT nasal spray Place 2 sprays into both nostrils daily. 16 g 2  . gabapentin (NEURONTIN) 300 MG capsule TAKE 1 CAPSULE (300 MG TOTAL) BY MOUTH 3 (THREE) TIMES DAILY. 90 capsule 2  . guaiFENesin (MUCINEX) 600 MG 12 hr tablet Take 2 tablets (1,200 mg total) by mouth 2 (two) times daily. 360 tablet 3  . hydrochlorothiazide (HYDRODIURIL) 50 MG tablet Take 1 tablet (50 mg total) by mouth daily. 30 tablet 2  . HYDROcodone-homatropine (HYCODAN) 5-1.5 MG/5ML syrup Take 5 mLs by mouth every 8 (eight) hours as needed for cough. 120 mL 0  . isosorbide mononitrate (IMDUR) 30 MG 24 hr tablet Take 1 tablet (30 mg total) by mouth 2 (two) times daily. 180 tablet 3  . levETIRAcetam (KEPPRA) 750 MG tablet Take 1 tablet (750 mg total) by mouth  2 (two) times daily. 60 tablet 11  . levofloxacin (LEVAQUIN) 500 MG tablet Take 1 tablet (500 mg total) by mouth daily. 7 tablet 0  . LORazepam (ATIVAN) 0.5 MG tablet Take 0.25 mg by mouth every 6 (six) hours as needed for anxiety.    [name] losartan (COZAAR) 100 MG tablet Take 1 tablet (100 mg total) by mouth daily. 90 tablet 3  . nitroGLYCERIN (NITROSTAT) 0.4 MG SL tablet Place 1 tablet (0.4 mg total) under the tongue every 5 (five) minutes as needed. 25 tablet 11  . Omega-3 Fatty Acids ([facility] NATURAL FISH OIL) 1200 MG CAPS Take 1 capsule by mouth 2 (two) times daily. 180 capsule 3  . ONE TOUCH ULTRA TEST test strip 3 (three) times daily. for testing  4  . phenytoin (DILANTIN) 100 MG ER capsule Take 100 mg by mouth 2 (two) times daily.    . potassium chloride (K-DUR) 10 MEQ tablet Take 1 tablet (10 mEq total) by mouth 2 (two) times daily. 180 tablet 3  . ranolazine (RANEXA) 500 MG 12 hr tablet Take 1 tablet (500 mg total) by mouth 2 (two) times daily. 60 tablet 2  . rosuvastatin (CRESTOR) 10 MG tablet Take 1 tablet (10 mg total) by mouth  daily. 90 tablet 3  . VOLTAREN 1 % GEL      No current facility-administered medications on file prior to visit.    ALLERGIES: Allergies  Allergen Reactions  . Ace Inhibitors     REACTION: cough  . Bee Venom   . Eggs Or Egg-Derived Products     Abdominal discomfort-no allergy She can get a flu shot   . Tetracycline     REACTION: reaction not known    FAMILY HISTORY: Family History  Problem Relation Age of Onset  . Coronary artery disease    . Heart disease    . Heart disease Mother   . Diabetes Mother   . COPD Mother   . Hyperlipidemia Mother   . Hypertension Mother   . Cancer Father   . Drug abuse Paternal Grandmother   . Stroke Paternal Grandfather     SOCIAL HISTORY: History   Social History  . Marital Status: Divorced    Spouse Name: N/A  . Number of Children: N/A  . Years of Education: N/A   Occupational History  . Not on file.   Social History Main Topics  . Smoking status: Current Every Day Smoker -- 1.50 packs/day for 36 years    Types: Cigarettes  . Smokeless tobacco: Never Used     Comment: 1.5-2 ppd  patien is aware that she needs to quit   . Alcohol Use: No  . Drug Use: No  . Sexual Activity:    Partners: Male    Birth Control/ Protection: None   Other Topics Concern  . Not on file   Social History Narrative    REVIEW OF SYSTEMS: Constitutional: No fevers, chills, or sweats, no generalized fatigue, change in appetite Eyes: No visual changes, double vision, eye pain Ear, nose and throat: No hearing loss, ear pain, nasal congestion, sore throat Cardiovascular: No chest pain, palpitations Respiratory:  No shortness of breath at rest or with exertion, wheezes GastrointestinaI: No nausea, vomiting, diarrhea, abdominal pain, fecal incontinence Genitourinary:  No dysuria, urinary retention or frequency Musculoskeletal:  No neck pain, back pain Integumentary: No rash, pruritus, skin lesions Neurological: as above Psychiatric: No  depression, insomnia, anxiety Endocrine: No palpitations, fatigue, diaphoresis, mood swings, change in appetite, change  in weight, increased thirst Hematologic/Lymphatic:  No anemia, purpura, petechiae. Allergic/Immunologic: no itchy/runny eyes, nasal congestion, recent allergic reactions, rashes  PHYSICAL EXAM: Filed Vitals:   03/22/15 1139  BP: 100/60  Pulse: 80   General: No acute distress Head:  Normocephalic/atraumatic Eyes:  Fundoscopic exam unremarkable without vessel changes, exudates, hemorrhages or papilledema. Neck: supple, no paraspinal tenderness, full range of motion Heart:  Regular rate and rhythm Lungs:  Clear to auscultation bilaterally Back: No paraspinal tenderness Neurological Exam: alert and oriented to person, place, and time. Attention span and concentration intact, recent and remote memory intact, fund of knowledge intact.  Speech fluent and not dysarthric, language intact.  CN II-XII intact. Fundoscopic exam unremarkable without vessel changes, exudates, hemorrhages or papilledema.  Bulk and tone normal, muscle strength 5/5 throughout.  Sensation to light touch, temperature and vibration intact.  Deep tendon reflexes 2+ throughout, toes downgoing.  Finger to nose and heel to shin testing intact.  Gait normal, Romberg negative.  IMPRESSION: Localization-related epilepsy with complex partial seizures Cervical radiculopathy Cerebrovascular disease  PLAN: Continue Keppra '750mg'$  twice daily and Dilantin ER '100mg'$  twice daily with calcium '600mg'$  twice daily and vitamin D 800 units daily Check CBC and CMP Gabapentin '300mg'$  three times daily for cervical radicular pain. ASA and smoking cessation for secondary stroke prevention Next time she drools, asked to look in mirror for facial droop Otherwise, follow up in one year  15 minutes spent face to face with patient, over 50% spent discussing management.  Metta Clines, DO  CC: Loura Pardon, MD

## 2015-03-23 ENCOUNTER — Telehealth: Payer: Self-pay | Admitting: Neurology

## 2015-03-23 NOTE — Telephone Encounter (Signed)
Patient is aware that RX is waiting at pharmacy  However she is not sure she can afford to pick them up the calcium they can only fill at 1260 mg

## 2015-03-23 NOTE — Telephone Encounter (Signed)
Please call [facility] regarding calcium Citrate script sent in yesterday, this med is not available in the dose we called in. Please call back at [phone] / Sherri S.

## 2015-03-23 NOTE — Telephone Encounter (Signed)
Whatever dose that can equal up to 1200 to '1500mg'$  daily.  I don't know the available individual doses.

## 2015-03-23 NOTE — Telephone Encounter (Signed)
Please advise on what dose you would like her to have  I called in 1500 mg calcium Citrate to pharmacy yesterday

## 2015-03-28 ENCOUNTER — Ambulatory Visit (INDEPENDENT_AMBULATORY_CARE_PROVIDER_SITE_OTHER): Payer: Medicare Other | Admitting: Primary Care

## 2015-03-28 ENCOUNTER — Other Ambulatory Visit: Payer: Self-pay

## 2015-03-28 ENCOUNTER — Ambulatory Visit (INDEPENDENT_AMBULATORY_CARE_PROVIDER_SITE_OTHER)
Admission: RE | Admit: 2015-03-28 | Discharge: 2015-03-28 | Disposition: A | Discharge status: Home / Self Care | Payer: Medicare Other | Source: Ambulatory Visit | Attending: Primary Care | Admitting: Primary Care

## 2015-03-28 ENCOUNTER — Encounter: Payer: Self-pay | Admitting: Primary Care

## 2015-03-28 VITALS — BP 142/72 | HR 84 | Temp 97.4°F | Ht <= 58 in | Wt 145.8 lb

## 2015-03-28 DIAGNOSIS — Z23 Encounter for immunization: Secondary | ICD-10-CM

## 2015-03-28 DIAGNOSIS — H6123 Impacted cerumen, bilateral: Secondary | ICD-10-CM

## 2015-03-28 DIAGNOSIS — N1330 Unspecified hydronephrosis: Secondary | ICD-10-CM | POA: Diagnosis not present

## 2015-03-28 DIAGNOSIS — R1900 Intra-abdominal and pelvic swelling, mass and lump, unspecified site: Secondary | ICD-10-CM | POA: Diagnosis not present

## 2015-03-28 DIAGNOSIS — N139 Obstructive and reflux uropathy, unspecified: Secondary | ICD-10-CM | POA: Diagnosis not present

## 2015-03-28 MED ORDER — IOHEXOL 300 MG/ML  SOLN
100.0000 mL | Freq: Once | INTRAMUSCULAR | Status: AC | PRN
  Administered 2015-03-28: 100 mL via INTRAVENOUS

## 2015-03-28 MED ORDER — ZOSTER VACCINE LIVE 19400 UNT/0.65ML ~~LOC~~ SOLR
0.6500 mL | Freq: Once | SUBCUTANEOUS | Status: DC
Start: 2015-03-28 — End: 2015-04-12

## 2015-03-28 NOTE — Patient Instructions (Addendum)
Stop by the front to speak with Diane [name] regarding your CT scan. I will call you later with the report.  Take the Shingles prescription to your pharmacy for the vaccine.  It was nice meeting you!

## 2015-03-28 NOTE — Progress Notes (Signed)
Pre visit review using our clinic review tool, if applicable. No additional management support is needed unless otherwise documented below in the visit note. 

## 2015-03-28 NOTE — Progress Notes (Signed)
Subjective:    Patient ID: Diane [name], female    DOB: 12/21/1955, 59 y.o.   MRN: [id number]  HPI  Diane [name] is a 59 year old female who presents today with a chief complaint of earache and ear fullness. Her fullness has been present for the past 5 days. She's been using Debrox drops for the past 3 days without any improvement. She has not been taking her regular Zyrtec lately.   2) Abdominal bulging: Present to left upper abdomen and has been present for the past 6 weeks. She only notices the bulge when reclining. Denies pain, tenderness, bloody stools, diarrhea, constipation. She also reports nausea that has been intermittent for the past 2-3 months.   Review of Systems  Constitutional: Negative for fever and chills.  HENT: Positive for congestion and ear pain. Negative for rhinorrhea, sinus pressure and sore throat.        Ear fullness  Respiratory: Negative for shortness of breath.        Chronic cough, no change in cough.  Cardiovascular: Negative for chest pain.  Gastrointestinal: Positive for nausea and abdominal distention. Negative for vomiting, abdominal pain, diarrhea, constipation and blood in stool.       Past Medical History  Diagnosis Date  . Coronary artery disease     post CABG in 3/07   . Hypertension   . Hyperlipidemia   . Dyslipidemia   . Diabetes mellitus     type 2  . Chronic bronchitis   . CVA (cerebral infarction) 1993  . Tobacco abuse   . COPD (chronic obstructive pulmonary disease)   . Stroke 1993  . Seizures   . Carotid stenosis   . Systolic murmur     known mild AS and MR    History   Social History  . Marital Status: Divorced    Spouse Name: N/A  . Number of Children: N/A  . Years of Education: N/A   Occupational History  . Not on file.   Social History Main Topics  . Smoking status: Current Every Day Smoker -- 1.50 packs/day for 36 years    Types: Cigarettes  . Smokeless tobacco: Never Used     Comment: 1.5-2 ppd  patien is  aware that she needs to quit   . Alcohol Use: No  . Drug Use: No  . Sexual Activity:    Partners: Male    Birth Control/ Protection: None   Other Topics Concern  . Not on file   Social History Narrative    Past Surgical History  Procedure Laterality Date  . Coronary stent placement  08/11/06    PCI of her ciurcumflex/OM vessel  . Cardiac catheterization  11/29/05    EF of 55%  . Cardiac catheterization  08/06/06    EF of 45-50%  . Coronary artery bypass graft  12/04/2005    x5 -- left internal mammary artery to the LAD, left radial artery to the ramus intermedius, saphenous vein graft to the obtuse marginal 1, sequential saphenous vein grat to the acute marginal and posterior descending, endoscopic vein harvesting from the left thigh with open vein harvest from right leg    Family History  Problem Relation Age of Onset  . Coronary artery disease    . Heart disease    . Heart disease Mother   . Diabetes Mother   . COPD Mother   . Hyperlipidemia Mother   . Hypertension Mother   . Cancer Father   . Drug  abuse Paternal Grandmother   . Stroke Paternal Grandfather     Allergies  Allergen Reactions  . Ace Inhibitors     REACTION: cough  . Bee Venom   . Eggs Or Egg-Derived Products     Abdominal discomfort-no allergy She can get a flu shot   . Tetracycline     REACTION: reaction not known    Current Outpatient Prescriptions on File Prior to Visit  Medication Sig Dispense Refill  . albuterol (PROVENTIL HFA;VENTOLIN HFA) 108 (90 BASE) MCG/ACT inhaler Inhale 2 puffs into the lungs every 6 (six) hours as needed for wheezing or shortness of breath. 1 Inhaler 5  . albuterol (PROVENTIL) (2.5 MG/3ML) 0.083% nebulizer solution USE 1 VIAL PER NEBULIZRE EVERY 6 HOURS AS NEEDED *HOSPITALIST DOESNT DO PA 75 mL 0  . aspirin 325 MG tablet Take 1 tablet (325 mg total) by mouth daily. 90 tablet 3  . carvedilol (COREG) 12.5 MG tablet Take 1 tablet (12.5 mg total) by mouth 2 (two) times daily  with a meal. 180 tablet 3  . cetirizine (ZYRTEC) 10 MG tablet Take 1 tablet (10 mg total) by mouth daily. 90 tablet 3  . clopidogrel (PLAVIX) 75 MG tablet Take 1 tablet (75 mg total) by mouth daily. 90 tablet 0  . cyclobenzaprine (FLEXERIL) 10 MG tablet     . DEXILANT 60 MG capsule TAKE 1 CAPSULE (60 MG TOTAL) BY MOUTH DAILY. 30 capsule 5  . diphenhydrAMINE (BENADRYL) 25 mg capsule Take 2 capsules in AM and 1 capsule at night 270 capsule 3  . DULoxetine (CYMBALTA) 60 MG capsule     . fluticasone (FLONASE) 50 MCG/ACT nasal spray Place 2 sprays into both nostrils daily. 16 g 2  . furosemide (LASIX) 40 MG tablet     . gabapentin (NEURONTIN) 300 MG capsule TAKE 1 CAPSULE (300 MG TOTAL) BY MOUTH 3 (THREE) TIMES DAILY. 90 capsule 2  . glimepiride (AMARYL) 2 MG tablet     . guaiFENesin (MUCINEX) 600 MG 12 hr tablet Take 2 tablets (1,200 mg total) by mouth 2 (two) times daily. 360 tablet 3  . hydrochlorothiazide (HYDRODIURIL) 50 MG tablet Take 1 tablet (50 mg total) by mouth daily. 30 tablet 2  . isosorbide mononitrate (IMDUR) 30 MG 24 hr tablet Take 1 tablet (30 mg total) by mouth 2 (two) times daily. 180 tablet 3  . levETIRAcetam (KEPPRA) 750 MG tablet Take 1 tablet (750 mg total) by mouth 2 (two) times daily. 60 tablet 11  . LORazepam (ATIVAN) 0.5 MG tablet Take 0.25 mg by mouth every 6 (six) hours as needed for anxiety.    [name] losartan (COZAAR) 100 MG tablet Take 1 tablet (100 mg total) by mouth daily. 90 tablet 3  . metFORMIN (GLUCOPHAGE-XR) 500 MG 24 hr tablet     . nitroGLYCERIN (NITROSTAT) 0.4 MG SL tablet Place 1 tablet (0.4 mg total) under the tongue every 5 (five) minutes as needed. 25 tablet 11  . Omega-3 Fatty Acids ([facility] NATURAL FISH OIL) 1200 MG CAPS Take 1 capsule by mouth 2 (two) times daily. 180 capsule 3  . ONE TOUCH ULTRA TEST test strip 3 (three) times daily. for testing  4  . phenytoin (DILANTIN) 100 MG ER capsule Take 100 mg by mouth 2 (two) times daily.    . potassium chloride  (K-DUR) 10 MEQ tablet Take 1 tablet (10 mEq total) by mouth 2 (two) times daily. 180 tablet 3  . ranolazine (RANEXA) 500 MG 12 hr tablet Take 1  tablet (500 mg total) by mouth 2 (two) times daily. 60 tablet 2  . rosuvastatin (CRESTOR) 10 MG tablet Take 1 tablet (10 mg total) by mouth daily. 90 tablet 3  . VOLTAREN 1 % GEL     . Calcium 1500 MG tablet Take 1 tablet (1,500 mg total) by mouth daily. (Patient not taking: Reported on 03/28/2015) 30 tablet 0  . Cholecalciferol ([facility] VITAMIN D3) 1000 UNITS capsule Take 1 capsule (1,000 Units total) by mouth daily. (Patient not taking: Reported on 03/28/2015) 30 capsule 0   No current facility-administered medications on file prior to visit.    BP 142/72 mmHg  Pulse 84  Temp(Src) 97.4 F (36.3 C) (Oral)  Ht '4\' 10"'$  (1.473 m)  Wt 145 lb 12.8 oz (66.134 kg)  BMI 30.48 kg/m2  SpO2 98%  LMP 09/02/2003    Objective:   Physical Exam  Constitutional: She appears well-nourished. She does not appear ill.  HENT:  Right Ear: Tympanic membrane and ear canal normal.  Left Ear: Tympanic membrane and ear canal normal.  Bilateral TM's impacted with cerumen. Majority was removed with plastic curette. Right ear with deeper impaction that required irrigation. TM's unremarkable bilaterally post irrigation.  Cardiovascular: Normal rate and regular rhythm.   Pulmonary/Chest: Effort normal and breath sounds normal.  Abdominal: Bowel sounds are normal. She exhibits mass. There is no tenderness.  Mass present to LUQ and LMQ near umbilicus.   Skin: Skin is warm and dry.          Assessment & Plan:  Cerumen impaction:  Present bilaterally with significant impaction. Removed most of cerumen with plastic curette; however, irrigation required for right ear. Some irritation present to right canal post irrigation. TM's bilateral unremarkable post irrigation. Debrox drops for prevention and to help soften future buildup.

## 2015-03-28 NOTE — Assessment & Plan Note (Signed)
Present to left upper and mid abdomen x several months. Noticed mostly when reclining. Denies pain, hematuria, n/v/d, constipation. Present on exam, very large. CT of abd/pelvis with contrast obtained STAT.  IMPRESSION: 1. Very large 25 cm complex cystic lesion identified in the central abdomen and anatomic pelvis. Given the configuration, suspect this is arising from anatomy in the anatomic pelvis and may be ovarian in origin. Mesenteric cyst and peritoneal inclusion cyst would also be considerations.  2. Left hydronephrosis with obstructive uropathy. This is almost assuredly related to mass-effect on the mid to distal left ureter by the large cystic lesion.  Patient leaving town today, strongly advised that she stay in town so we can arrange follow up.  She insisted on leaving regardless of recommendation and will return Wednesday and plans to leave again Friday. Will arrange for consultation. Not sure if general surgery, GYN, or renal. Will consult with PCP regarding findings and arrange follow up urgently.

## 2015-03-29 ENCOUNTER — Other Ambulatory Visit: Payer: Self-pay | Admitting: Primary Care

## 2015-03-29 ENCOUNTER — Telehealth: Payer: Self-pay | Admitting: *Deleted

## 2015-03-29 DIAGNOSIS — R1900 Intra-abdominal and pelvic swelling, mass and lump, unspecified site: Secondary | ICD-10-CM

## 2015-03-29 NOTE — Telephone Encounter (Signed)
-----   Message from Pieter Partridge, DO sent at 03/23/2015  7:06 AM EDT ----- Labs stable

## 2015-03-29 NOTE — Telephone Encounter (Signed)
Left message that labs are stable

## 2015-03-30 ENCOUNTER — Other Ambulatory Visit: Payer: Self-pay | Admitting: Primary Care

## 2015-03-30 DIAGNOSIS — R1900 Intra-abdominal and pelvic swelling, mass and lump, unspecified site: Secondary | ICD-10-CM | POA: Diagnosis not present

## 2015-03-30 DIAGNOSIS — N8320 Unspecified ovarian cysts: Secondary | ICD-10-CM | POA: Diagnosis not present

## 2015-04-07 DIAGNOSIS — Z1231 Encounter for screening mammogram for malignant neoplasm of breast: Secondary | ICD-10-CM | POA: Diagnosis not present

## 2015-04-11 ENCOUNTER — Ambulatory Visit (HOSPITAL_BASED_OUTPATIENT_CLINIC_OR_DEPARTMENT_OTHER): Payer: Medicare Other

## 2015-04-11 ENCOUNTER — Ambulatory Visit: Payer: Medicare Other | Attending: Gynecologic Oncology | Admitting: Gynecologic Oncology

## 2015-04-11 ENCOUNTER — Encounter: Payer: Self-pay | Admitting: Gynecologic Oncology

## 2015-04-11 VITALS — BP 103/69 | HR 76 | Resp 16 | Ht <= 58 in | Wt 143.2 lb

## 2015-04-11 DIAGNOSIS — R1909 Other intra-abdominal and pelvic swelling, mass and lump: Secondary | ICD-10-CM

## 2015-04-11 DIAGNOSIS — R1900 Intra-abdominal and pelvic swelling, mass and lump, unspecified site: Secondary | ICD-10-CM

## 2015-04-11 NOTE — Progress Notes (Signed)
Consult Note: Gyn-Onc  Consult was requested by Dr. Charlesetta Garibaldi for the evaluation of Diane [name] 59 y.o. female with 25cm left ovarian mass.  CC:  Chief Complaint  Patient presents with  . Pelvic Mass    New patient    Assessment/Plan:  Ms. TIEN AISPURO  is a 59 y.o.  year old with a very large left adomino-pelvic mass, suspected to be from left ovary. It is associated with mild hydroureter, but no signs of metastatic ovarian cancer. CA 125 tumor marking assay is pending. It is minimally symptomatic. It is arising in the setting of multiple medical comorbidities including coronary disease, CVD, COPD.   I performed a history, physical examination, and personally reviewed the patient's imaging films including the CT abdomen and pelvis from 6./27/16.  I discussed with the patient that my suspicion for malignancy is actually lower given the large size of the mass without additional features such as ascites, peritoneal carcinomatosis or lymphadenopathy. I discussed that malignancy is a possibility, but more likely a borderline tumor or a mucinous or low grade tumor. I discussed that regardless, I am recommending surgery (exploratory laparotomy and BSO with frozen section) because her cyst is very large and compressing the left ureter and symptomatic.  However, because of her serious comorbidities, use of anti-platelet agents and what I am concerned are angina symptoms, I feel that surgery should not be performed until her cardiac health is optimized. Fortunately she has a planned appointment with her cardiologist tomorrow. We will ask for risk stratification and optimization preoperatively and advice regarding when a safe time is to stop anti-platelet therapy preoperatively, because her risk of major bleeding complications is elevated while on (or recently on) anti-platelet therapy. I discussed that she is also at other substantial risk due to her DM and tobacco use and COPD. I discussed increase  risk of wound infection, dehiscence, reoperation and wound packing.  We discussed her increased risk for perioperative cardiac or pulmonary events, readmission and reoperation which are all elevated due to her comorbidities. She is at increased risk for needing a blood transfusion due to her anti-platelet therapy use. She will need to be type and crossed preoperatively.  I discussed anticipated operative recovery and hospital stay. I discussed staging procedures (hysterectomy, omentectomy, lymphadenectomy) if occult cancer is found on frozen section.  I discussed that her preliminary surgery date will be 05/10/15 with my partner, Dr Alycia Rossetti, but this may be postponed if her cardiac optimization requires additional delays.  We will draw CA 125 today to further characterize likelihood of malignancy.  HPI: Diane [name] is a 59 year old gravida 0 who is seen in consultation at the request of Dr. Charlesetta Garibaldi for a 25 cm mostly cystic pelvic mass arising from likely the left ovary. The patient has had symptoms of intermittent nausea for the past month. She is also noted distention of the abdomen. She reported this to her provider who scheduled her for a CT scan of the abdomen and pelvis with contrast. This was performed on 03/28/2015. It revealed mild left hydronephrosis associated with obstruction from a 24 x 25 x 16 cm cystic mass in the central abdomen and pelvis with multiple internal septations. The mass was asymmetric towards the left anterior abdominal wall. There was no ascites. There was no lymphadenopathy. There is no omental cake or carcinomatosis.  Follow-up ultrasound scan was performed by Dr. delighted on 03/30/2015. This revealed a small normal uterus measuring 5.4 x 4.1 x 2.9 cm with a thin endometrial  stripe of 1.3 mm. A large cystic mass was also confirmed arising from the pelvis.  Of note Taura has multiple medical comorbidities. She has a history of a myocardial infarction in 2007 for which she  received a 5 vessel CABG. 6 months after this surgery she required repeat catheterization and placement of a coronary stent. She takes Plavix and aspirin for her coronary disease. She reports to me exhaustion including shortness of breath and feeling weak when she attempts to walk up a fight of stairs. She is also limited by knee pain in scaling stairs. She endorses mild chest pains with walking upstairs. She reports chest pains every other day requiring nitroglycerin use (which relieves the symptoms). The last reported visit with her cardiologist, Dr. Driscilla Moats, was in 2014.  Retina has a history of substantial tobacco abuse. She smokes 1-1/2 packs per day and has so since age 47. She has been successful in discontinuing smoking for only a few months after history of a CVA remotely.  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 She has ongoing deficits with balance abnormalities.  She is history of obstructive sleep apnea, however does not use her BiPAP at present. She is due for repeat sleep studies.  She also has a history of diabetes mellitus, and reports that her most recent HbA1c was mild elevated at 7.1%.  She also has a substantial surgical history with a prior low transverse laparotomy for opening up of the fallopian tubes due to primary infertility and possibly a history of pelvic inflammatory disease. She also has a history of an open appendectomy and open cholecystectomy. She has a history of a coronary artery bypass grafts.   She reports having abnormal Pap smears in the past. 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 Her last Pap was performed last week and is pending with respect to his results. She has a remote history of an excisional procedure on the cervix.  Current Meds:  Outpatient Encounter Prescriptions as of 04/11/2015  Medication Sig  . albuterol (PROVENTIL HFA;VENTOLIN HFA) 108 (90  BASE) MCG/ACT inhaler Inhale 2 puffs into the lungs every 6 (six) hours as needed for wheezing or shortness of breath.  [name] albuterol (PROVENTIL) (2.5 MG/3ML) 0.083% nebulizer solution USE 1 VIAL PER NEBULIZRE EVERY 6 HOURS AS NEEDED *HOSPITALIST DOESNT DO PA  . aspirin 325 MG tablet Take 1 tablet (325 mg total) by mouth daily.  . carvedilol (COREG) 12.5 MG tablet Take 1 tablet (12.5 mg total) by mouth 2 (two) times daily with a meal.  . cetirizine (ZYRTEC) 10 MG tablet Take 1 tablet (10 mg total) by mouth daily.  . clopidogrel (PLAVIX) 75 MG tablet Take 1 tablet (75 mg total) by mouth daily.  . cyclobenzaprine (FLEXERIL) 10 MG tablet   . DEXILANT 60 MG capsule TAKE 1 CAPSULE (60 MG TOTAL) BY MOUTH DAILY.  . diphenhydrAMINE (BENADRYL) 25 mg capsule Take 2 capsules in AM and 1 capsule at night  . DULoxetine (CYMBALTA) 60 MG capsule   . fluticasone (FLONASE) 50 MCG/ACT nasal spray Place 2 sprays into both nostrils daily.  . furosemide (LASIX) 40 MG tablet   . gabapentin (NEURONTIN) 300 MG capsule TAKE 1 CAPSULE (300 MG TOTAL) BY MOUTH 3 (THREE) TIMES DAILY.  [name] glimepiride (AMARYL) 2 MG tablet   . guaiFENesin (MUCINEX) 600 MG 12 hr tablet Take 2 tablets (1,200 mg total) by mouth 2 (  two) times daily.  . hydrochlorothiazide (HYDRODIURIL) 50 MG tablet Take 1 tablet (50 mg total) by mouth daily.  . isosorbide mononitrate (IMDUR) 30 MG 24 hr tablet Take 1 tablet (30 mg total) by mouth 2 (two) times daily.  [name] levETIRAcetam (KEPPRA) 750 MG tablet Take 1 tablet (750 mg total) by mouth 2 (two) times daily.  [name] LORazepam (ATIVAN) 0.5 MG tablet Take 0.25 mg by mouth every 6 (six) hours as needed for anxiety.  [name] losartan (COZAAR) 100 MG tablet Take 1 tablet (100 mg total) by mouth daily.  . metFORMIN (GLUCOPHAGE-XR) 500 MG 24 hr tablet   . nitroGLYCERIN (NITROSTAT) 0.4 MG SL tablet Place 1 tablet (0.4 mg total) under the tongue every 5 (five) minutes as needed.  . Omega-3 Fatty Acids ([employer] NATURAL FISH OIL) 1200  MG CAPS Take 1 capsule by mouth 2 (two) times daily.  . ONE TOUCH ULTRA TEST test strip 3 (three) times daily. for testing  . phenytoin (DILANTIN) 100 MG ER capsule Take 100 mg by mouth 2 (two) times daily.  . potassium chloride (K-DUR) 10 MEQ tablet Take 1 tablet (10 mEq total) by mouth 2 (two) times daily.  . ranolazine (RANEXA) 500 MG 12 hr tablet Take 1 tablet (500 mg total) by mouth 2 (two) times daily.  . rosuvastatin (CRESTOR) 10 MG tablet Take 1 tablet (10 mg total) by mouth daily.  . VOLTAREN 1 % GEL   . zoster vaccine live, PF, (ZOSTAVAX) 01093 UNT/0.65ML injection Inject 19,400 Units into the skin once.  . Calcium 1500 MG tablet Take 1 tablet (1,500 mg total) by mouth daily. (Patient not taking: Reported on 03/28/2015)  . Cholecalciferol ([employer] VITAMIN D3) 1000 UNITS capsule Take 1 capsule (1,000 Units total) by mouth daily. (Patient not taking: Reported on 03/28/2015)   No facility-administered encounter medications on file as of 04/11/2015.    Allergy:  Allergies  Allergen Reactions  . Ace Inhibitors     REACTION: cough  . Bee Venom   . Eggs Or Egg-Derived Products     Abdominal discomfort-no allergy She can get a flu shot   . Tetracycline     REACTION: reaction not known    Social Hx:   History   Social History  . Marital Status: Divorced    Spouse Name: N/A  . Number of Children: N/A  . Years of Education: N/A   Occupational History  . Not on file.   Social History Main Topics  . Smoking status: Current Every Day Smoker -- 1.50 packs/day for 36 years    Types: Cigarettes  . Smokeless tobacco: Never Used     Comment: 1.5-2 ppd  patien is aware that she needs to quit   . Alcohol Use: No  . Drug Use: No  . Sexual Activity:    Partners: Male    Birth Control/ Protection: None   Other Topics Concern  . Not on file   Social History Narrative    Past Surgical Hx:  Past Surgical History  Procedure Laterality Date  . Coronary stent placement  08/11/06     PCI of her ciurcumflex/OM vessel  . Cardiac catheterization  11/29/05    EF of 55%  . Cardiac catheterization  08/06/06    EF of 45-50%  . Coronary artery bypass graft  12/04/2005    x5 -- left internal mammary artery to the LAD, left radial artery to the ramus intermedius, saphenous vein graft to the obtuse marginal 1, sequential saphenous vein grat  to the acute marginal and posterior descending, endoscopic vein harvesting from the left thigh with open vein harvest from right leg  . Cholecystectomy    . Appendectomy    . Cervical fusion  1990    Past Medical Hx:  Past Medical History  Diagnosis Date  . Coronary artery disease     post CABG in 3/07   . Hypertension   . Hyperlipidemia   . Dyslipidemia   . Diabetes mellitus     type 2  . Chronic bronchitis   . CVA (cerebral infarction) 1993  . Tobacco abuse   . COPD (chronic obstructive pulmonary disease)   . Stroke 1993  . Seizures   . Carotid stenosis   . Systolic murmur     known mild AS and MR    Past Gynecological History:  Hx of high risk HPV positive. Last pap last week (pending), remote hx of cervical excisional procedure. 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  Patient's last menstrual period was 09/02/2003.  Family Hx:  Family History  Problem Relation Age of Onset  . Coronary artery disease    . Heart disease    . Heart disease Mother   . Diabetes Mother   . COPD Mother   . Hyperlipidemia Mother   . Hypertension Mother   . Cancer Father   . Drug abuse Paternal Grandmother   . Stroke Paternal Grandfather   . Cancer Paternal Aunt     lung with mets to brain  . Cancer Paternal Uncle     skin cancer  . Cancer Paternal Uncle     lung/liver to brain  . Cancer Paternal Uncle     cancer of unknown type    Review of Systems:  Constitutional  Feels well,   ENT Normal appearing ears and nares bilaterally Skin/Breast  No rash, sores, jaundice, itching, dryness Cardiovascular  + chest pain, shortness  of breath, no edema  Pulmonary  + cough and wheeze.  Gastro Intestinal  + nausea, No vomitting, or diarrhoea. No bright red blood per rectum, no abdominal pain, change in bowel movement, or constipation.  Genito Urinary  No frequency, urgency, dysuria, no postmenopausal bleeding or abnormal discharge Musculo Skeletal  + fibromyalgia, myalgia, arthralgia, Neurologic  + balance abnormalities Psychology  No depression, anxiety, insomnia.   Vitals:  Blood pressure 103/69, pulse 76, resp. rate 16, height '4\' 10"'$  (1.473 m), weight 143 lb 3.2 oz (64.955 kg), last menstrual period 09/02/2003.  Physical Exam: WD in NAD Neck  Supple NROM, without any enlargements.  Lymph Node Survey No cervical supraclavicular or inguinal adenopathy Cardiovascular  Pulse normal rate, regularity and rhythm. S1 and S2 normal.  Lungs  Clear to auscultation bilateraly, without wheezes/crackles/rhonchi. Good air movement.  Skin  No rash/lesions/breakdown  Psychiatry  Alert and oriented to person, place, and time  Abdomen  Normoactive bowel sounds, abdomen soft, non-tender and overweight, large distenstion of abdoemn with cystic mass extending from pelvis on left into upper left abdomen. without evidence of hernia. Back No CVA tenderness Genito Urinary  Vulva/vagina: Normal external female genitalia.  No lesions. No discharge or bleeding.  Bladder/urethra:  No lesions or masses, well supported bladder  Vagina: nromal  Cervix: normal to palpate but unable to see secondary to displacement from mass  Uterus: Small, mobile, no parametrial involvement or nodularity.  Adnexa: large cystic mobile abdomino-pelvic mass without nodularity.   Rectal  Good tone, no masses no cul de sac nodularity.  Extremities  No  bilateral cyanosis, clubbing or edema.  Donaciano Eva, MD   04/11/2015, 10:52 AM

## 2015-04-11 NOTE — Patient Instructions (Addendum)
We will check a CA 125 today.                  Preparing for your Surgery  Plan for surgery on August 9 with Dr. Alycia Rossetti.  Pre-operative Testing -You will receive a phone call from presurgical testing at [facility] to arrange for a pre-operative testing appointment before your surgery.  This appointment normally occurs one to two weeks before your scheduled surgery.   -Bring your insurance card, copy of an advanced directive if applicable, medication list  -At that visit, you will be asked to sign a consent for a possible blood transfusion in case a transfusion becomes necessary during surgery.  The need for a blood transfusion is rare but having consent is a necessary part of your care.     -You will need cardiac clearance.  We will contact you with instructions about your blood thinners once Dr. Denman George has talked with Dr. [place].  Day Before Surgery at [facility] will be asked to take in only clear liquids the day before surgery.  Examples of clear liquids include broths, jello, and clear juices.  Avoid carbonated beverages.  You will be advised to have nothing to eat or drink after midnight the evening before.    Your role in recovery Your role is to become active as soon as directed by your doctor, while still giving yourself time to heal.  Rest when you feel tired. You will be asked to do the following in order to speed your recovery:  - Cough and breathe deeply. This helps toclear and expand your lungs and can prevent pneumonia. You may be given a spirometer to practice deep breathing. A staff member will show you how to use the spirometer. - Do mild physical activity. Walking or moving your legs help your circulation and body functions return to normal. A staff member will help you when you try to walk and will provide you with simple exercises. Do not try to get up or walk alone the first time. - Actively manage your pain. Managing your pain lets you move in comfort. We will ask  you to rate your pain on a scale of zero to 10. It is your responsibility to tell your doctor or nurse where and how much you hurt so your pain can be treated.  Special Considerations -If you are diabetic, you may be placed on insulin after surgery to have closer control over your blood sugars to promote healing and recovery.  This does not mean that you will be discharged on insulin.  If applicable, your oral antidiabetics will be resumed when you are tolerating a solid diet.  -Your final pathology results from surgery should be available by the Friday after surgery and the results will be relayed to you when available.  Blood Transfusion Information WHAT IS A BLOOD TRANSFUSION? A transfusion is the replacement of blood or some of its parts. Blood is made up of multiple cells which provide different functions.  Red blood cells carry oxygen and are used for blood loss replacement.  White blood cells fight against infection.  Platelets control bleeding.  Plasma helps clot blood.  Other blood products are available for specialized needs, such as hemophilia or other clotting disorders. BEFORE THE TRANSFUSION  Who gives blood for transfusions?   You may be able to donate blood to be used at a later date on yourself (autologous donation).  Relatives can be asked to donate blood. This is generally not  any safer than if you have received blood from a stranger. The same precautions are taken to ensure safety when a relative's blood is donated.  Healthy volunteers who are fully evaluated to make sure their blood is safe. This is blood bank blood. Transfusion therapy is the safest it has ever been in the practice of medicine. Before blood is taken from a donor, a complete history is taken to make sure that person has no history of diseases nor engages in risky social behavior (examples are intravenous drug use or sexual activity with multiple partners). The donor's travel history is screened to  minimize risk of transmitting infections, such as malaria. The donated blood is tested for signs of infectious diseases, such as HIV and hepatitis. The blood is then tested to be sure it is compatible with you in order to minimize the chance of a transfusion reaction. If you or a relative donates blood, this is often done in anticipation of surgery and is not appropriate for emergency situations. It takes many days to process the donated blood. RISKS AND COMPLICATIONS Although transfusion therapy is very safe and saves many lives, the main dangers of transfusion include:   Getting an infectious disease.  Developing a transfusion reaction. This is an allergic reaction to something in the blood you were given. Every precaution is taken to prevent this. The decision to have a blood transfusion has been considered carefully by your caregiver before blood is given. Blood is not given unless the benefits outweigh the risks.

## 2015-04-12 ENCOUNTER — Encounter: Payer: Self-pay | Admitting: Cardiology

## 2015-04-12 ENCOUNTER — Telehealth: Payer: Self-pay | Admitting: Nurse Practitioner

## 2015-04-12 ENCOUNTER — Ambulatory Visit (INDEPENDENT_AMBULATORY_CARE_PROVIDER_SITE_OTHER): Payer: Medicare Other | Admitting: Cardiology

## 2015-04-12 VITALS — BP 102/64 | HR 80 | Ht <= 58 in | Wt 143.1 lb

## 2015-04-12 DIAGNOSIS — R1900 Intra-abdominal and pelvic swelling, mass and lump, unspecified site: Secondary | ICD-10-CM

## 2015-04-12 DIAGNOSIS — I25708 Atherosclerosis of coronary artery bypass graft(s), unspecified, with other forms of angina pectoris: Secondary | ICD-10-CM

## 2015-04-12 DIAGNOSIS — I10 Essential (primary) hypertension: Secondary | ICD-10-CM | POA: Diagnosis not present

## 2015-04-12 DIAGNOSIS — I779 Disorder of arteries and arterioles, unspecified: Secondary | ICD-10-CM | POA: Diagnosis not present

## 2015-04-12 DIAGNOSIS — I5032 Chronic diastolic (congestive) heart failure: Secondary | ICD-10-CM | POA: Diagnosis not present

## 2015-04-12 DIAGNOSIS — I739 Peripheral vascular disease, unspecified: Principal | ICD-10-CM

## 2015-04-12 LAB — HM MAMMOGRAPHY: HM Mammogram: ABNORMAL

## 2015-04-12 LAB — CA 125: CA 125: 9 U/mL (ref <35)

## 2015-04-12 MED ORDER — ONDANSETRON HCL 4 MG PO TABS: 4.0000 mg | ORAL_TABLET | Freq: Three times a day (TID) | ORAL | Status: DC | PRN

## 2015-04-12 NOTE — Progress Notes (Signed)
Diane [name] Date of Birth: Jun 19, 1956 Medical Record #[id number]  History of Present Illness: Diane [name] is seen today for preoperative assessment for TAH/BSO. Diane [name] She has a history of coronary disease and is status post CABG in March of 2007 by Dr. [name]. she presented later that year with recurrent angina. Repeat cardiac cath showed patent LIMA to the LAD but all other grafts occluded including SVG to OM, SVG to AC/PL, and radial graft to ramus intermediate. She then had complex stenting of the mid LCx and first OM with Taxus stents. The native RCA was occluded with collaterals.  She has multiple cardiac risk factors including diabetes, dyslipidemia, hypertension, and tobacco abuse. She also has moderate bilateral carotid arterial disease. She continues to smoke. Her last cardiac evaluation with a nuclear stress test in November 2012 showed that the inferior wall was obscured by uptake of the tracer in the bowel. There is no definite ischemia. Ejection fraction was reduced. However an echocardiogram she had normal vigorous LV function.   On follow up today she reports she noted increased swelling in left abdomen. This lead to CT which showed a mass. TAH and BSO planned by Dr. Denman George. CA 125 normal. From a cardiac standpoint she does complain of chest pain on exertion. This includes walking or household chores. This is unchanged over the past year. More recently she complained of swelling in her legs and increased dyspnea. She was placed on lasix and she lost 10 lbs and had resolution of these symptoms. No palpitations. No TIA or CVA symptoms. She does complain that her legs are weak when she walks to mailbox.   Current Outpatient Prescriptions on File Prior to Visit  Medication Sig Dispense Refill  . albuterol (PROVENTIL HFA;VENTOLIN HFA) 108 (90 BASE) MCG/ACT inhaler Inhale 2 puffs into the lungs every 6 (six) hours as needed for wheezing or shortness of breath. 1 Inhaler 5  . albuterol (PROVENTIL)  (2.5 MG/3ML) 0.083% nebulizer solution USE 1 VIAL PER NEBULIZRE EVERY 6 HOURS AS NEEDED *HOSPITALIST DOESNT DO PA 75 mL 0  . aspirin 325 MG tablet Take 1 tablet (325 mg total) by mouth daily. 90 tablet 3  . carvedilol (COREG) 12.5 MG tablet Take 1 tablet (12.5 mg total) by mouth 2 (two) times daily with a meal. 180 tablet 3  . clopidogrel (PLAVIX) 75 MG tablet Take 1 tablet (75 mg total) by mouth daily. 90 tablet 0  . cyclobenzaprine (FLEXERIL) 10 MG tablet     . DEXILANT 60 MG capsule TAKE 1 CAPSULE (60 MG TOTAL) BY MOUTH DAILY. 30 capsule 5  . diphenhydrAMINE (BENADRYL) 25 mg capsule Take 2 capsules in AM and 1 capsule at night 270 capsule 3  . fluticasone (FLONASE) 50 MCG/ACT nasal spray Place 2 sprays into both nostrils daily. 16 g 2  . furosemide (LASIX) 40 MG tablet     . gabapentin (NEURONTIN) 300 MG capsule TAKE 1 CAPSULE (300 MG TOTAL) BY MOUTH 3 (THREE) TIMES DAILY. 90 capsule 2  . glimepiride (AMARYL) 2 MG tablet     . guaiFENesin (MUCINEX) 600 MG 12 hr tablet Take 2 tablets (1,200 mg total) by mouth 2 (two) times daily. 360 tablet 3  . hydrochlorothiazide (HYDRODIURIL) 50 MG tablet Take 1 tablet (50 mg total) by mouth daily. 30 tablet 2  . isosorbide mononitrate (IMDUR) 30 MG 24 hr tablet Take 1 tablet (30 mg total) by mouth 2 (two) times daily. 180 tablet 3  . levETIRAcetam (KEPPRA) 750 MG tablet Take 1  tablet (750 mg total) by mouth 2 (two) times daily. 60 tablet 11  . losartan (COZAAR) 100 MG tablet Take 1 tablet (100 mg total) by mouth daily. 90 tablet 3  . metFORMIN (GLUCOPHAGE-XR) 500 MG 24 hr tablet     . nitroGLYCERIN (NITROSTAT) 0.4 MG SL tablet Place 1 tablet (0.4 mg total) under the tongue every 5 (five) minutes as needed. 25 tablet 11  . phenytoin (DILANTIN) 100 MG ER capsule Take 100 mg by mouth 2 (two) times daily.    . potassium chloride (K-DUR) 10 MEQ tablet Take 1 tablet (10 mEq total) by mouth 2 (two) times daily. 180 tablet 3  . ranolazine (RANEXA) 500 MG 12 hr  tablet Take 1 tablet (500 mg total) by mouth 2 (two) times daily. 60 tablet 2  . rosuvastatin (CRESTOR) 10 MG tablet Take 1 tablet (10 mg total) by mouth daily. 90 tablet 3  . VOLTAREN 1 % GEL      No current facility-administered medications on file prior to visit.    Allergies  Allergen Reactions  . Ace Inhibitors     REACTION: cough  . Bee Venom   . Eggs Or Egg-Derived Products     Abdominal discomfort-no allergy She can get a flu shot   . Tetracycline     REACTION: reaction not known    Past Medical History  Diagnosis Date  . Coronary artery disease     post CABG in 3/07   . Hypertension   . Hyperlipidemia   . Dyslipidemia   . Diabetes mellitus     type 2  . Chronic bronchitis   . CVA (cerebral infarction) 1993  . Tobacco abuse   . COPD (chronic obstructive pulmonary disease)   . Stroke 1993  . Seizures   . Carotid stenosis   . Systolic murmur     known mild AS and MR    Past Surgical History  Procedure Laterality Date  . Coronary stent placement  08/11/06    PCI of her ciurcumflex/OM vessel  . Cardiac catheterization  11/29/05    EF of 55%  . Cardiac catheterization  08/06/06    EF of 45-50%  . Coronary artery bypass graft  12/04/2005    x5 -- left internal mammary artery to the LAD, left radial artery to the ramus intermedius, saphenous vein graft to the obtuse marginal 1, sequential saphenous vein grat to the acute marginal and posterior descending, endoscopic vein harvesting from the left thigh with open vein harvest from right leg  . Cholecystectomy    . Appendectomy    . Cervical fusion  1990    History  Smoking status  . Current Every Day Smoker -- 1.50 packs/day for 36 years  . Types: Cigarettes  Smokeless tobacco  . Never Used    Comment: 1.5-2 ppd  patien is aware that she needs to quit     History  Alcohol Use No    Family History  Problem Relation Age of Onset  . Coronary artery disease    . Heart disease    . Heart disease Mother   .  Diabetes Mother   . COPD Mother   . Hyperlipidemia Mother   . Hypertension Mother   . Cancer Father   . Drug abuse Paternal Grandmother   . Stroke Paternal Grandfather   . Cancer Paternal Aunt     lung with mets to brain  . Cancer Paternal Uncle     skin cancer  . Cancer  Paternal Uncle     lung/liver to brain  . Cancer Paternal Uncle     cancer of unknown type    Review of Systems: As noted in history of present illness.  All other systems were reviewed and are negative.  Physical Exam: BP 102/64 mmHg  Pulse 80  Ht '4\' 10"'$  (1.473 m)  Wt 64.91 kg (143 lb 1.6 oz)  BMI 29.92 kg/m2  LMP 09/02/2003 She is a chronically ill-appearing white female in no acute distress. She is normocephalic, atraumatic. Pupils are equal round and reactive. Sclera clear. Oropharynx is clear. Neck reveals bilateral carotid bruits. There is no JVD. Lungs are clear. Cardiac exam reveals a grade 7-3/5 systolic murmur at the right upper sternal border. Abdomen is soft nontender with increased swelling left abdomen. She has no edema. Pedal pulses are good. Neuro alert and oriented x 3. No focal findings.  LABORATORY DATA: Lab Results  Component Value Date   WBC 6.4 03/22/2015   HGB 11.7* 03/22/2015   HCT 36.9 03/22/2015   PLT 285 03/22/2015   GLUCOSE 138* 03/22/2015   CHOL 152 06/09/2014   TRIG 123.0 06/09/2014   HDL 48.40 06/09/2014   LDLDIRECT 98.0 06/09/2013   LDLCALC 79 06/09/2014   ALT 11 03/22/2015   AST 12 03/22/2015   NA 132* 03/22/2015   K 3.7 03/22/2015   CL 92* 03/22/2015   CREATININE 0.74 03/22/2015   BUN 14 03/22/2015   CO2 26 03/22/2015   TSH 1.81 06/09/2014   INR 0.99 03/02/2013   HGBA1C 8.0* 06/09/2014   MICROALBUR 1.9 06/09/2013   Ecg today shows NSR with old inferior and anterior infarcts. No change since 03/02/13. I have personally reviewed and interpreted this study.   Assessment / Plan: 1. CAD S/p CABG. Failed bypass grafts except for LIMA to LAD. S/p remote stenting of  LCX and OM with Taxus DES. Stable class 2 angina. Continue Rx. With ASA, plavix, nitrates, coreg, and Ranexa. Stressed importance of smoking cessation. Will arrange for Leane Call to assess her ischemic risk for abdominal surgery. 2. Carotid arterial disese- moderate, bilateral. Repeat carotid dopplers now.  3. Tobacco abuse. Recommend complete cessation. Patient states she plans to quit when hospitalized for abdominal surgery. 4. HTN controlled. 5. Chronic diastolic CHF. Last EF was normal. Recent increased swelling and dyspnea responsive to lasix. Will update Echocardiogram. Appears euvolemic today.

## 2015-04-12 NOTE — Telephone Encounter (Signed)
Per Joylene John, NP, rn called patient to inform her CA 125 result is normal. Patient reporting nausea and asking how she can take pepto bismol leading up to surgery because it is "not a clear liquid." per NP, patient to stop pepto 1 week prior to surgery because it contains aspirin. We will call in zofran 4 mg to preferred pharmacy. Patient instructed to take 1-2 tablets as needed every 8 hours for nausea. She verbalizes understanding and thanks for the call.

## 2015-04-12 NOTE — Patient Instructions (Signed)
We will schedule you for a nuclear stress test, Echo, and carotid dopplers.   This will determine your surgical risk.

## 2015-04-13 ENCOUNTER — Telehealth: Payer: Self-pay | Admitting: *Deleted

## 2015-04-13 NOTE — Patient Outreach (Signed)
[facility]) Care Management  04/13/2015  Diane [name] May 26, 1956 [id number]   Referral from MD, assigned Mariann Laster, RN to outreach.  [name]. [place], [place] Management [place] Assistant Phone: [phone] Fax: [phone]

## 2015-04-13 NOTE — Telephone Encounter (Signed)
Received fax with prior authorization for ondansetron tablets from [facility]. Called patient's pharmacy to notify them of this and pharmacy tech stated that the prescription has been filled and is ready for the patient to pickup.

## 2015-04-18 ENCOUNTER — Other Ambulatory Visit: Payer: Self-pay

## 2015-04-18 NOTE — Patient Outreach (Signed)
[facility]) Care Management  04/18/2015  Jalonda Antigua [place]a 1955/11/06 [id number]  Telephonic Care Management Note  Referral Date:  04/13/15 Referral Source:  MD Referral Issue:  Chronic CHF Insurance:  PCP:  Dr. Peter Martinique Admissions:  ER visits:  Per Epic:  H/O increased swelling in left abdomen. CT showed a mass. TAH and BSO scheduled with Dr. Denman George.   Triage outreach call #1.  Patient not reached.  HIPAA compliant voice message left with name and contact information.  RN CM rescheduled for next contact attempt.  Mariann Laster, RN, BSN, [facility], CCM  [employer] Management Coordinator [phone] Office [phone] Direct [phone] Cell

## 2015-04-19 ENCOUNTER — Other Ambulatory Visit: Payer: Self-pay | Admitting: Obstetrics and Gynecology

## 2015-04-19 ENCOUNTER — Telehealth: Payer: Self-pay

## 2015-04-19 DIAGNOSIS — I5032 Chronic diastolic (congestive) heart failure: Secondary | ICD-10-CM

## 2015-04-19 DIAGNOSIS — R928 Other abnormal and inconclusive findings on diagnostic imaging of breast: Secondary | ICD-10-CM

## 2015-04-19 DIAGNOSIS — I5042 Chronic combined systolic (congestive) and diastolic (congestive) heart failure: Secondary | ICD-10-CM | POA: Insufficient documentation

## 2015-04-19 NOTE — Patient Outreach (Signed)
[facility] [facility]) Care Management  04/19/2015  Diane [name] January 21, 1956 [id number]   Telephonic Care Management Note (Triage/Screening)  Referral Date: 04/13/15 Referral Source: MD Referral Issue: Chronic CHF Triage Date:  04/19/2015 PCP: Dr. Peter Martinique Admissions: 0 ER visits: 0  Inbound call from patient returning call back to RN CM.    Patient states H/O seizures 2 years ago requiring hospital admission.  No seizures since but remains on seizure medication regime.  New mass found in abdomen and confirmed plan for surgery on 05/10/15.  Per Epic: H/O increased swelling in left abdomen. CT showed a mass. TAH and BSO scheduled with Dr. Denman George 05/10/2015.  States recent mammogram still under review and further studies have been recommended.    CHF States she was just recently told by her MD she has CHF.  Patient confirms she knows very little about CHF.  Medications:  States more than 10 meds a day and takes 16 pills in the am and 17 at night.  Last Primary MD appt was around August/September of 2015.  Sees MD every 6 months to 1 year.  Falls:  (1) due to stepping in a bucket while washing her car.   Consent Patient agreed to services for [facility] telephonic and / or community services as needed.  Patient agreed to Telephonic Disease Management Program for CHF.  RN CM encouraged patient to contact RN CM for any assistance needed with care coordination needs.   RN CM advised to please notify MD of any changes in her condition prior to scheduled appt's.   RN CM provided contact name and #, 24-hour nurse line # [phone].   RN CM confirmed patient is aware of 911 services for urgent emergency needs. RN CM instructed patient of next contact:  telephonic phone call from Disease Manager within 10 business days to complete assessment and provide CHF education.    Plan RN CM sent Health Coach / Disease Management Referral for CHF. RN CM notified [place]  Assistant:  Case status - active - level 2 - Engineer, maintenance.   Mariann Laster, RN, BSN, [facility], CCM  [employer] Management Coordinator [phone] Office [phone] Direct [phone] Cell

## 2015-04-19 NOTE — Patient Outreach (Signed)
[facility]) Care Management  04/19/2015  Diane [name] 05/11/1956 [id number]   Request from Mariann Laster, RN to assign [place], Jon Billings, RN to outreach.  [name]. [place], [place] Management [place] Assistant Phone: [phone] Fax: [phone]

## 2015-04-20 ENCOUNTER — Other Ambulatory Visit: Payer: Self-pay

## 2015-04-20 NOTE — Patient Outreach (Signed)
[facility]) Care Management  [place]  04/20/2015   Diane [name] 04-Nov-1955 [id number]  Telephone call to patient for initial health coach outreach.  Patient states she is doing ok. However, patient diagnosed with heart failure last week.  Patient to have echocardiogram, doppler, and stress test next week. Patient has extensive cardiac history. Patient also to have surgery on 05-10-15 due to mass in her abdomen.  Patient to stay two days inpatient. She reports she has family to help her once she comes home as patient lives alone.  Patient also mentions she is a diabetic and that her last A1c was either 7.1 or 7.2.  Patient also has history of seizures and fibromyalgia. Patient also shares that she has to go next week for a diagnostic mammogram as there was something seen on her mammogram.    Patient shares she does not know a lot about heart failure.  Discussed with patient heat failure and heart failure education plan.  She verbalized understanding.    Current Medications:  Current Outpatient Prescriptions  Medication Sig Dispense Refill  . acetaminophen-codeine (TYLENOL #3) 300-30 MG per tablet Take 1 tablet by mouth every 4 (four) hours as needed for moderate pain.    [name] albuterol (PROVENTIL HFA;VENTOLIN HFA) 108 (90 BASE) MCG/ACT inhaler Inhale 2 puffs into the lungs every 6 (six) hours as needed for wheezing or shortness of breath. 1 Inhaler 5  . albuterol (PROVENTIL) (2.5 MG/3ML) 0.083% nebulizer solution USE 1 VIAL PER NEBULIZRE EVERY 6 HOURS AS NEEDED *HOSPITALIST DOESNT DO PA 75 mL 0  . aspirin 325 MG tablet Take 1 tablet (325 mg total) by mouth daily. 90 tablet 3  . carvedilol (COREG) 12.5 MG tablet Take 1 tablet (12.5 mg total) by mouth 2 (two) times daily with a meal. 180 tablet 3  . clopidogrel (PLAVIX) 75 MG tablet Take 1 tablet (75 mg total) by mouth daily. 90 tablet 0  . cyclobenzaprine (FLEXERIL) 10 MG tablet     . DEXILANT 60 MG capsule TAKE 1 CAPSULE  (60 MG TOTAL) BY MOUTH DAILY. 30 capsule 5  . diphenhydrAMINE (BENADRYL) 25 mg capsule Take 2 capsules in AM and 1 capsule at night 270 capsule 3  . fluticasone (FLONASE) 50 MCG/ACT nasal spray Place 2 sprays into both nostrils daily. 16 g 2  . furosemide (LASIX) 40 MG tablet     . gabapentin (NEURONTIN) 300 MG capsule TAKE 1 CAPSULE (300 MG TOTAL) BY MOUTH 3 (THREE) TIMES DAILY. 90 capsule 2  . glimepiride (AMARYL) 2 MG tablet     . guaiFENesin (MUCINEX) 600 MG 12 hr tablet Take 2 tablets (1,200 mg total) by mouth 2 (two) times daily. 360 tablet 3  . hydrochlorothiazide (HYDRODIURIL) 50 MG tablet Take 1 tablet (50 mg total) by mouth daily. 30 tablet 2  . isosorbide mononitrate (IMDUR) 30 MG 24 hr tablet Take 1 tablet (30 mg total) by mouth 2 (two) times daily. 180 tablet 3  . levETIRAcetam (KEPPRA) 750 MG tablet Take 1 tablet (750 mg total) by mouth 2 (two) times daily. 60 tablet 11  . losartan (COZAAR) 100 MG tablet Take 1 tablet (100 mg total) by mouth daily. 90 tablet 3  . metFORMIN (GLUCOPHAGE-XR) 500 MG 24 hr tablet     . ondansetron (ZOFRAN) 4 MG tablet Take 1 tablet (4 mg total) by mouth every 8 (eight) hours as needed for nausea or vomiting (take 1-2 tablets as needed every 8 hours for nausea). 20 tablet  0  . phenytoin (DILANTIN) 100 MG ER capsule Take 100 mg by mouth 2 (two) times daily.    . potassium chloride (K-DUR) 10 MEQ tablet Take 1 tablet (10 mEq total) by mouth 2 (two) times daily. 180 tablet 3  . ranolazine (RANEXA) 500 MG 12 hr tablet Take 1 tablet (500 mg total) by mouth 2 (two) times daily. 60 tablet 2  . rosuvastatin (CRESTOR) 10 MG tablet Take 1 tablet (10 mg total) by mouth daily. 90 tablet 3  . VOLTAREN 1 % GEL     . nitroGLYCERIN (NITROSTAT) 0.4 MG SL tablet Place 1 tablet (0.4 mg total) under the tongue every 5 (five) minutes as needed. 25 tablet 11   No current facility-administered medications for this visit.    Functional Status:  In your present state of  health, do you have any difficulty performing the following activities: 04/20/2015  Hearing? N  Vision? N  Difficulty concentrating or making decisions? N  Walking or climbing stairs? Y  Dressing or bathing? N  Doing errands, shopping? N  Preparing Food and eating ? N  Using the Toilet? N  In the past six months, have you accidently leaked urine? N  Do you have problems with loss of bowel control? N  Managing your Medications? N  Managing your Finances? N  Housekeeping or managing your Housekeeping? N    Fall/Depression Screening: PHQ 2/9 Scores 04/20/2015 06/16/2014 06/16/2013  PHQ - 2 Score 0 0 0   THN CM Care Plan Problem One        Patient Outreach Telephone from 04/20/2015 in [place] Problem One  Knowledge deficit related to heart failure   Care Plan for Problem One  Active   THN Long Term Goal (31-90 days)  Patient will be able to identify heart failure zones within 90 days.    THN Long Term Goal Start Date  04/20/15   Interventions for Problem One Long Term Goal  Discussed heart failure zone chart and purpose.    THN CM Short Term Goal #1 (0-30 days)  Patient will be able to verbalize symptoms of heart failure green zone within 30 days.   THN CM Short Term Goal #1 Start Date  04/20/15   Interventions for Short Term Goal #1  Explained to patient green zone of heart failure zone chart.    THN CM Short Term Goal #2 (0-30 days)  Patient will be able to name three foods that are high in sodium within 30 days.   THN CM Short Term Goal #2 Start Date  04/20/15   Interventions for Short Term Goal #2  Discussed with patient how frozen/fresh/and canned foods are different in sodium content.       Assessment: Patient will benefit from health coach outreach for education for self management of heart failure.   Plan: RN Health Coach will provide ongoing education for patient on heart failure through phone calls and sending printed information to patient for  further discussion. RN Health Coach will send welcome packet with consent to patient as well as printed information on health failure. RN Health Coach will send initial barriers letter, assessment, and care plan to primary care physician. RN Health Coach will contact patient within one month and patient agrees to next contact.   Jone Baseman, RN, MSN [place] [phone]

## 2015-04-21 ENCOUNTER — Telehealth: Payer: Self-pay

## 2015-04-21 ENCOUNTER — Encounter: Payer: Self-pay | Admitting: Family Medicine

## 2015-04-21 NOTE — Telephone Encounter (Signed)
Let's just do her physical labs at her physical appt (then we can review and see what she needs and what she doesn't)

## 2015-04-21 NOTE — Telephone Encounter (Signed)
Pt notified of Dr. Marliss Coots comments. Lab appt cancelled

## 2015-04-21 NOTE — Telephone Encounter (Signed)
Patient notified me that she is having a mass removed in her abdomen on 05/10/15, and says she is having labs the day before the procedure. Patient states that has an appointment for labwork on 06/14/15, prior to a CPE scheduled with Dr. Glori Bickers. Patient wants to know that since those labs will only be done about a month prior to her CPE, will those labs will be okay to go by, or if she still needs to come in for new labs on 06/14/15?

## 2015-04-21 NOTE — Telephone Encounter (Signed)
Called patient to notify her of being due for a mammogram. Patient stated that she just had a mammogram at [place] and they found a mass in her left breast. Patient states that she is awaiting a call back about an appointment being set up for a diagnostic procedure to further look into the mass that was found.

## 2015-04-22 ENCOUNTER — Telehealth (HOSPITAL_COMMUNITY): Payer: Self-pay

## 2015-04-22 NOTE — Telephone Encounter (Signed)
Encounter complete. 

## 2015-04-25 ENCOUNTER — Ambulatory Visit (HOSPITAL_COMMUNITY)
Admission: RE | Admit: 2015-04-25 | Discharge: 2015-04-25 | Disposition: A | Discharge status: Home / Self Care | Payer: Medicare Other | Source: Ambulatory Visit | Attending: Cardiology | Admitting: Cardiology

## 2015-04-25 ENCOUNTER — Ambulatory Visit: Payer: Self-pay

## 2015-04-25 ENCOUNTER — Ambulatory Visit (HOSPITAL_COMMUNITY)
Admission: RE | Admit: 2015-04-25 | Discharge: 2015-04-25 | Disposition: A | Discharge status: Home / Self Care | Payer: Medicare Other | Source: Ambulatory Visit | Attending: Internal Medicine | Admitting: Internal Medicine

## 2015-04-25 DIAGNOSIS — E119 Type 2 diabetes mellitus without complications: Secondary | ICD-10-CM | POA: Diagnosis not present

## 2015-04-25 DIAGNOSIS — I779 Disorder of arteries and arterioles, unspecified: Secondary | ICD-10-CM

## 2015-04-25 DIAGNOSIS — I739 Peripheral vascular disease, unspecified: Principal | ICD-10-CM

## 2015-04-25 DIAGNOSIS — I25708 Atherosclerosis of coronary artery bypass graft(s), unspecified, with other forms of angina pectoris: Secondary | ICD-10-CM

## 2015-04-25 DIAGNOSIS — Z720 Tobacco use: Secondary | ICD-10-CM | POA: Insufficient documentation

## 2015-04-25 DIAGNOSIS — I10 Essential (primary) hypertension: Secondary | ICD-10-CM | POA: Insufficient documentation

## 2015-04-25 DIAGNOSIS — E785 Hyperlipidemia, unspecified: Secondary | ICD-10-CM | POA: Insufficient documentation

## 2015-04-25 DIAGNOSIS — I25810 Atherosclerosis of coronary artery bypass graft(s) without angina pectoris: Secondary | ICD-10-CM | POA: Diagnosis not present

## 2015-04-25 NOTE — Progress Notes (Signed)
Carotid Duplex Completed. Taleah Bellantoni, BS, RDMS, RVT  

## 2015-04-26 ENCOUNTER — Telehealth: Payer: Self-pay | Admitting: Cardiology

## 2015-04-26 ENCOUNTER — Other Ambulatory Visit: Payer: Self-pay | Admitting: Obstetrics and Gynecology

## 2015-04-26 ENCOUNTER — Ambulatory Visit
Admission: RE | Admit: 2015-04-26 | Discharge: 2015-04-26 | Disposition: A | Discharge status: Home / Self Care | Payer: Medicare Other | Source: Ambulatory Visit | Attending: Obstetrics and Gynecology | Admitting: Obstetrics and Gynecology

## 2015-04-26 DIAGNOSIS — R921 Mammographic calcification found on diagnostic imaging of breast: Secondary | ICD-10-CM | POA: Diagnosis not present

## 2015-04-26 DIAGNOSIS — R928 Other abnormal and inconclusive findings on diagnostic imaging of breast: Secondary | ICD-10-CM

## 2015-04-26 LAB — HM MAMMOGRAPHY

## 2015-04-26 NOTE — Telephone Encounter (Signed)
Forward to [place] RN  PER Dr Stanford Breed  May restart when hemostatis occur with biopsy

## 2015-04-26 NOTE — Telephone Encounter (Signed)
Patient was seen at the [facility] and it was determined that she needs a biopsy.  Does she need to stop her blood thinner and if so, how long?

## 2015-04-26 NOTE — Telephone Encounter (Signed)
WILL DEFER TO DR CRENSHAW ( D.O.D) DR Bryon Lions OUT OF OFFICE FOR THE WEEK.

## 2015-04-26 NOTE — Telephone Encounter (Signed)
Hold plavix 7 days prior to procedure and resume the day after; continue asa Kirk Ruths

## 2015-04-26 NOTE — Telephone Encounter (Signed)
Spoke to patient. she is aware that the breast center has the information about  plavix She verbalized understanding.  patient states she is schedule to have another surgery on 05/10/15.

## 2015-04-26 NOTE — Telephone Encounter (Signed)
Follow up       Calling to see if the breast center called to get instructions on stopping blood thinner prior to procedure.

## 2015-04-27 ENCOUNTER — Ambulatory Visit (HOSPITAL_COMMUNITY)
Admission: RE | Admit: 2015-04-27 | Discharge: 2015-04-27 | Disposition: A | Discharge status: Home / Self Care | Payer: Medicare Other | Source: Ambulatory Visit | Attending: Cardiovascular Disease | Admitting: Cardiovascular Disease

## 2015-04-27 DIAGNOSIS — I779 Disorder of arteries and arterioles, unspecified: Secondary | ICD-10-CM

## 2015-04-27 DIAGNOSIS — R9439 Abnormal result of other cardiovascular function study: Secondary | ICD-10-CM | POA: Insufficient documentation

## 2015-04-27 DIAGNOSIS — I10 Essential (primary) hypertension: Secondary | ICD-10-CM | POA: Diagnosis not present

## 2015-04-27 DIAGNOSIS — I25708 Atherosclerosis of coronary artery bypass graft(s), unspecified, with other forms of angina pectoris: Secondary | ICD-10-CM | POA: Diagnosis not present

## 2015-04-27 DIAGNOSIS — I739 Peripheral vascular disease, unspecified: Secondary | ICD-10-CM

## 2015-04-27 LAB — MYOCARDIAL PERFUSION IMAGING
LV dias vol: 153 mL
LV sys vol: 102 mL
Peak HR: 81 {beats}/min
Rest HR: 70 {beats}/min
SDS: 5
SRS: 10
SSS: 15
TID: 1.05

## 2015-04-27 MED ORDER — AMINOPHYLLINE 25 MG/ML IV SOLN
75.0000 mg | Freq: Once | INTRAVENOUS | Status: AC
  Administered 2015-04-27: 75 mg via INTRAVENOUS

## 2015-04-27 MED ORDER — TECHNETIUM TC 99M SESTAMIBI GENERIC - CARDIOLITE
30.1000 | Freq: Once | INTRAVENOUS | Status: AC | PRN
  Administered 2015-04-27: 30.1 via INTRAVENOUS

## 2015-04-27 MED ORDER — TECHNETIUM TC 99M SESTAMIBI GENERIC - CARDIOLITE
10.7000 | Freq: Once | INTRAVENOUS | Status: AC | PRN
  Administered 2015-04-27: 11 via INTRAVENOUS

## 2015-04-27 MED ORDER — REGADENOSON 0.4 MG/5ML IV SOLN
0.4000 mg | Freq: Once | INTRAVENOUS | Status: AC
Start: 2015-04-27 — End: 2015-04-27
  Administered 2015-04-27: 0.4 mg via INTRAVENOUS

## 2015-04-29 ENCOUNTER — Telehealth: Payer: Self-pay | Admitting: *Deleted

## 2015-04-29 NOTE — Telephone Encounter (Signed)
Patient can come in any day for an appointment after 10am

## 2015-04-29 NOTE — Telephone Encounter (Signed)
LMTCB for stress test results.  High-risk stress test - needs appointment - need to schedule with [employer]?

## 2015-04-29 NOTE — Telephone Encounter (Signed)
Spoke with patient regarding test results. She voiced understand of need for follow up appointment with either MD or APP. Will forward message to [facility] to assist with scheduling (possibly with [employer]?)

## 2015-05-02 ENCOUNTER — Ambulatory Visit
Admission: RE | Admit: 2015-05-02 | Discharge: 2015-05-02 | Disposition: A | Discharge status: Home / Self Care | Payer: Medicare Other | Source: Ambulatory Visit | Attending: Physician Assistant | Admitting: Physician Assistant

## 2015-05-02 ENCOUNTER — Telehealth: Payer: Self-pay | Admitting: Gynecologic Oncology

## 2015-05-02 ENCOUNTER — Ambulatory Visit (INDEPENDENT_AMBULATORY_CARE_PROVIDER_SITE_OTHER): Payer: Medicare Other | Admitting: Physician Assistant

## 2015-05-02 ENCOUNTER — Telehealth: Payer: Self-pay | Admitting: *Deleted

## 2015-05-02 ENCOUNTER — Inpatient Hospital Stay (HOSPITAL_COMMUNITY): Admission: RE | Admit: 2015-05-02 | Payer: Self-pay | Source: Ambulatory Visit

## 2015-05-02 ENCOUNTER — Encounter: Payer: Self-pay | Admitting: Cardiology

## 2015-05-02 ENCOUNTER — Other Ambulatory Visit: Payer: Self-pay

## 2015-05-02 ENCOUNTER — Encounter: Payer: Self-pay | Admitting: Physician Assistant

## 2015-05-02 VITALS — BP 114/62 | HR 106 | Ht <= 58 in | Wt 141.5 lb

## 2015-05-02 DIAGNOSIS — R9439 Abnormal result of other cardiovascular function study: Secondary | ICD-10-CM

## 2015-05-02 DIAGNOSIS — R931 Abnormal findings on diagnostic imaging of heart and coronary circulation: Secondary | ICD-10-CM

## 2015-05-02 DIAGNOSIS — D689 Coagulation defect, unspecified: Secondary | ICD-10-CM | POA: Diagnosis not present

## 2015-05-02 DIAGNOSIS — Z01818 Encounter for other preprocedural examination: Secondary | ICD-10-CM

## 2015-05-02 DIAGNOSIS — R0602 Shortness of breath: Secondary | ICD-10-CM | POA: Diagnosis not present

## 2015-05-02 DIAGNOSIS — I25708 Atherosclerosis of coronary artery bypass graft(s), unspecified, with other forms of angina pectoris: Secondary | ICD-10-CM

## 2015-05-02 LAB — BASIC METABOLIC PANEL
BUN: 19 mg/dL (ref 7–25)
CO2: 30 mmol/L (ref 20–31)
Calcium: 9.5 mg/dL (ref 8.6–10.4)
Chloride: 96 mmol/L — ABNORMAL LOW (ref 98–110)
Creat: 0.85 mg/dL (ref 0.50–0.99)
Glucose, Bld: 100 mg/dL — ABNORMAL HIGH (ref 65–99)
Potassium: 4.7 mmol/L (ref 3.5–5.3)
Sodium: 136 mmol/L (ref 135–146)

## 2015-05-02 LAB — CBC
HCT: 37.7 % (ref 36.0–46.0)
Hemoglobin: 12.4 g/dL (ref 12.0–15.0)
MCH: 23.4 pg — ABNORMAL LOW (ref 26.0–34.0)
MCHC: 32.9 g/dL (ref 30.0–36.0)
MCV: 71.1 fL — ABNORMAL LOW (ref 78.0–100.0)
MPV: 8.3 fL — ABNORMAL LOW (ref 8.6–12.4)
Platelets: 285 10*3/uL (ref 150–400)
RBC: 5.3 MIL/uL — ABNORMAL HIGH (ref 3.87–5.11)
RDW: 19.3 % — ABNORMAL HIGH (ref 11.5–15.5)
WBC: 6.7 10*3/uL (ref 4.0–10.5)

## 2015-05-02 MED ORDER — POTASSIUM CHLORIDE ER 10 MEQ PO TBCR: 10.0000 meq | EXTENDED_RELEASE_TABLET | Freq: Two times a day (BID) | ORAL | Status: DC

## 2015-05-02 MED ORDER — CARVEDILOL 12.5 MG PO TABS
12.5000 mg | ORAL_TABLET | Freq: Two times a day (BID) | ORAL | Status: DC
Start: 2015-05-02 — End: 2015-05-06

## 2015-05-02 NOTE — Progress Notes (Addendum)
Patient ID: Diane [name], female   DOB: 1956/09/15, 59 y.o.   MRN: [id number]    Date:  05/02/2015   ID:  Diane [name], DOB [birth year], MRN [id number]  PCP:  Loura Pardon, MD  Primary Cardiologist:  [place]  Chief Complaint  Patient presents with  . Follow-up  . Shortness of Breath    on exertion  . Edema    in legs, feet, and ankles/ pt states when Diabetic Dr prescribed her LASIX it went away  . Claudication    pt states she has pain and weakness when shes up and moving around  . Nausea    pt states she thinks its from the mass on her stomach; trying to get cardiac clearance for surgery     History of Present Illness: Diane [name] is a 59 y.o. female with a history of coronary disease and is status post CABG in March of 2007 by Dr. [name]. she presented later that year with recurrent angina. Repeat cardiac cath showed patent LIMA to the LAD but all other grafts occluded including SVG to OM, SVG to AC/PL, and radial graft to ramus intermediate. She then had complex stenting of the mid LCx and first OM with Taxus stents. The native RCA was occluded with collaterals. She has multiple cardiac risk factors including diabetes, dyslipidemia, hypertension, and tobacco abuse. She also has moderate bilateral carotid arterial disease. She continues to smoke.  Cardiac evaluation in Nov 2012 with a nuclear stress test showed that the inferior wall was obscured by uptake of the tracer in the bowel. There is no definite ischemia. Ejection fraction was reduced. However, an echocardiogram at that time revealed normal vigorous LV function.   She was seen in the office with Dr. Martinique on 04/12/2015 at which time she reported increased swelling in left abdomen. This lead to CT which showed a mass. TAH and BSO planned by Dr. Denman George. CA 125 normal. From a cardiac standpoint she does complain of chest pain on exertion. This includes walking or household chores. This is unchanged over the past year. More  recently she complained of swelling in her legs and increased dyspnea. She was placed on lasix and she lost 10 lbs and had resolution of these symptoms. No palpitations. No TIA or CVA symptoms. She complained that her legs are weak when she walks to mailbox.  Patient underwent nuclear stress testing and echocardiogram. The first was high risk with ejection fraction of 34%. There is a medium defect of moderate severity in the basal anterior septal basal inferior lateral and mid anteroseptal and mid inferior lateral locations. Ischemia was noted. Echocardiogram revealed an ejection fraction of 35% with Basal inferoseptal akinesis,basal to mid inferior akinesis, basal to mid inferolateral severehypokinesis, basal anterolateral hypokinesis. She continues to complain of angina with exertion and occasionally radiates to her jaw and arm. She does not have it every day.  She does sleep on 2-1/2 pillows because it helps her breathing. Lower extremity edema has resolved.  She reports nausea which is typically when she wakes up. She continues to smoke. She reports the mass in her abdomen appears to be getting bigger.  The patient currently denies  vomiting, fever, dizziness, PND, cough, congestion, hematochezia, melena, lower extremity edema, claudication.  Wt Readings from Last 3 Encounters:  05/02/15 141 lb 8 oz (64.184 kg)  04/27/15 143 lb (64.864 kg)  04/20/15 143 lb 9.6 oz (65.137 kg)     Past Medical History  Diagnosis Date  . Coronary  artery disease     post CABG in 3/07   . Hypertension   . Hyperlipidemia   . Dyslipidemia   . Diabetes mellitus     type 2  . Chronic bronchitis   . CVA (cerebral infarction) 1993  . Tobacco abuse   . COPD (chronic obstructive pulmonary disease)   . Stroke 1993  . Seizures   . Carotid stenosis   . Systolic murmur     known mild AS and MR    Current Outpatient Prescriptions  Medication Sig Dispense Refill  . acetaminophen-codeine (TYLENOL #3) 300-30 MG  per tablet Take 1 tablet by mouth every 4 (four) hours as needed for moderate pain.    [name] albuterol (PROVENTIL HFA;VENTOLIN HFA) 108 (90 BASE) MCG/ACT inhaler Inhale 2 puffs into the lungs every 6 (six) hours as needed for wheezing or shortness of breath. 1 Inhaler 5  . albuterol (PROVENTIL) (2.5 MG/3ML) 0.083% nebulizer solution USE 1 VIAL PER NEBULIZRE EVERY 6 HOURS AS NEEDED *HOSPITALIST DOESNT DO PA 75 mL 0  . aspirin 325 MG tablet Take 1 tablet (325 mg total) by mouth daily. 90 tablet 3  . carvedilol (COREG) 12.5 MG tablet Take 1 tablet (12.5 mg total) by mouth 2 (two) times daily with a meal. 180 tablet 3  . clopidogrel (PLAVIX) 75 MG tablet Take 1 tablet (75 mg total) by mouth daily. 90 tablet 0  . cyclobenzaprine (FLEXERIL) 10 MG tablet Take 10 mg by mouth at bedtime.     [name] DEXILANT 60 MG capsule TAKE 1 CAPSULE (60 MG TOTAL) BY MOUTH DAILY. 30 capsule 5  . diphenhydrAMINE (BENADRYL) 25 mg capsule Take 2 capsules in AM and 1 capsule at night (Patient taking differently: Take 25 mg by mouth 2 (two) times daily. ) 270 capsule 3  . fluticasone (FLONASE) 50 MCG/ACT nasal spray Place 2 sprays into both nostrils daily. 16 g 2  . furosemide (LASIX) 40 MG tablet Take 40 mg by mouth daily.     [name] gabapentin (NEURONTIN) 300 MG capsule TAKE 1 CAPSULE (300 MG TOTAL) BY MOUTH 3 (THREE) TIMES DAILY. 90 capsule 2  . glimepiride (AMARYL) 2 MG tablet Take 2 mg by mouth daily as needed (if sugar is above 140 take additional tab at bedtime).     [name] guaiFENesin (MUCINEX) 600 MG 12 hr tablet Take 2 tablets (1,200 mg total) by mouth 2 (two) times daily. 360 tablet 3  . hydrochlorothiazide (HYDRODIURIL) 50 MG tablet Take 1 tablet (50 mg total) by mouth daily. 30 tablet 2  . isosorbide mononitrate (IMDUR) 30 MG 24 hr tablet Take 1 tablet (30 mg total) by mouth 2 (two) times daily. 180 tablet 3  . levETIRAcetam (KEPPRA) 750 MG tablet Take 1 tablet (750 mg total) by mouth 2 (two) times daily. 60 tablet 11  . losartan  (COZAAR) 100 MG tablet Take 1 tablet (100 mg total) by mouth daily. 90 tablet 3  . metFORMIN (GLUCOPHAGE-XR) 500 MG 24 hr tablet Take 1,000 mg by mouth 2 (two) times daily.  6  . nitroGLYCERIN (NITROSTAT) 0.4 MG SL tablet Place 1 tablet (0.4 mg total) under the tongue every 5 (five) minutes as needed. 25 tablet 11  . ondansetron (ZOFRAN) 4 MG tablet Take 1 tablet (4 mg total) by mouth every 8 (eight) hours as needed for nausea or vomiting (take 1-2 tablets as needed every 8 hours for nausea). 20 tablet 0  . phenytoin (DILANTIN) 100 MG ER capsule Take 100 mg by mouth 2 (  two) times daily.    . potassium chloride (K-DUR) 10 MEQ tablet Take 1 tablet (10 mEq total) by mouth 2 (two) times daily. 180 tablet 3  . ranolazine (RANEXA) 500 MG 12 hr tablet Take 1 tablet (500 mg total) by mouth 2 (two) times daily. 60 tablet 2  . rosuvastatin (CRESTOR) 10 MG tablet Take 1 tablet (10 mg total) by mouth daily. 90 tablet 3  . VOLTAREN 1 % GEL Apply 2 g topically as needed (pain).      No current facility-administered medications for this visit.    Allergies:    Allergies  Allergen Reactions  . Ace Inhibitors     REACTION: cough  . Bee Venom   . Eggs Or Egg-Derived Products     Abdominal discomfort-no allergy She can get a flu shot   . Tetracycline     REACTION: reaction not known    Social History:  The patient  reports that she has been smoking Cigarettes.  She has a 54 pack-year smoking history. She has never used smokeless tobacco. She reports that she does not drink alcohol or use illicit drugs.   Family history:   Family History  Problem Relation Age of Onset  . Coronary artery disease    . Heart disease    . Heart disease Mother   . Diabetes Mother   . COPD Mother   . Hyperlipidemia Mother   . Hypertension Mother   . Cancer Father   . Drug abuse Paternal Grandmother   . Stroke Paternal Grandfather   . Cancer Paternal Aunt     lung with mets to brain  . Cancer Paternal Uncle     skin  cancer  . Cancer Paternal Uncle     lung/liver to brain  . Cancer Paternal Uncle     cancer of unknown type    ROS:  Please see the history of present illness.  All other systems reviewed and negative.   PHYSICAL EXAM: VS:  BP 114/62 mmHg  Pulse 106  Ht '4\' 10"'$  (1.473 m)  Wt 141 lb 8 oz (64.184 kg)  BMI 29.58 kg/m2  LMP 09/02/2003 Overweight well developed, in no acute distress HEENT: Pupils are equal round react to light accommodation extraocular movements are intact.  Neck: no JVDNo cervical lymphadenopathy. Cardiac: Regular rate and rhythm with 1/6 murmur RSB Lungs:  clear to auscultation bilaterally, no wheezing, rhonchi or rales Abd: soft on the right upper quadrant however mid abdomen is distended and firm, nontender, positive bowel sounds all quadrants,  Ext: no lower extremity edema.  2+ radial and dorsalis pedis pulses. Skin: warm and dry Neuro:  Grossly normal    ASSESSMENT AND PLAN:  Stable angina Status post high-risk nuclear stress test and echocardiogram revealing reduced ejection fraction compared to prior. She'll be scheduled for right and left heart catheterizations.  This is difficult situation given the patient needs to have abdominal surgery for a very large complex cystic lesion.  If she requires a stenting, a bare metal stent is recommended.  But even after that the patient will require three months on antiplatelet therapy.  I have instructed her NOT to stop the Plavix until we get the results of her heart cath. This lesion is causing obstructive uropathy likely from the mass effect.  We will be checking labs prior to cardiac catheterization.    Ischemic cardiomyopathy  patient's weight is stable. She appears euvolemic.   Tobacco abuse Patient continues to smoke.  Cessation discussed sounds  as though she is contemplating starting to use nicotine patches  Hyperlipidemia Continue statin.

## 2015-05-02 NOTE — Patient Instructions (Addendum)
Your physician has requested that you have a cardiac catheterization. Cardiac catheterization is used to diagnose and/or treat various heart conditions. Doctors may recommend this procedure for a number of different reasons. The most common reason is to evaluate chest pain. Chest pain can be a symptom of coronary artery disease (CAD), and cardiac catheterization can show whether plaque is narrowing or blocking your heart's arteries. This procedure is also used to evaluate the valves, as well as measure the blood flow and oxygen levels in different parts of your heart. For further information please visit [email or web address].  This will be scheduled to be done this week.  Following your catheterization, you will not be allowed to drive for 3 days.  No lifting, pushing, or pulling greater that 10 pounds is allowed for 1 week.  You will be required to have the following tests prior to the procedure:  1. Blood work-the blood work can be done no more than 7 days prior to the procedure.  It can be done at any [facility] lab.  There is one downstairs on the first floor of this building and one in the [facility] building [id number]).  2. Chest Xray-the chest xray order has already been placed at the [facility].

## 2015-05-02 NOTE — Patient Outreach (Signed)
[facility]) Care Management  05/02/2015  Diane [name] [facility] April 14, 1956 [id number]   Telephone call to patient for follow up prior to surgery.  Patient reports seeing cardiology today and patient has to have a cardiac cath with possible stent placement due to heart not beating correctly.  Patient states that cath is on Friday at 1:30 pm.  Patient states that her surgery to the mass in her abdomen is going to have to be rescheduled as if she has stents placed she cannot have surgery for 3 months.  Patient also reports that her breast biopsy also has to be put off due to possible stent placement.  Patient states she will make some phone calls today to make providers aware of problems with her heart and heart cath.  Offered to assist patient she declined and will be making calls today.  No concerns voiced.   Plan: RN Health Coach will contact patient within the next -2 weeks for update on progress.    Jone Baseman, RN, MSN [place] [phone]

## 2015-05-02 NOTE — Telephone Encounter (Signed)
Patient saw B. [place], [place] Curlene Dolphin

## 2015-05-02 NOTE — Telephone Encounter (Signed)
Patient called and stated "I am supposed to have surgery with Dr. Denman George and get cardiac clearance but I have not received cardiac clearance."  "I have not stopped taking my plavix.  Can you let Dr. Denman George know?  I still have to have further cardiac evaluation and I can not have surgery yet."  Joylene John, NP notified.  Surgery moved to August 18.  Attempted to call patient and let her know that her surgery had been moved while waiting for cardiac clearance.  Message left.

## 2015-05-02 NOTE — Telephone Encounter (Signed)
Patient returned call.  Stating she will not know what is needed cardiac wise until after her cardiac cath on Friday and she may need stents.  Informed that her surgical date has been pushed back to August 18 and if she needs stents, we will post-pone her surgery until a later date if cardiac clearance obtained.  Advised to call for any questions or concerns.

## 2015-05-03 ENCOUNTER — Encounter: Payer: Self-pay | Admitting: Cardiology

## 2015-05-03 ENCOUNTER — Other Ambulatory Visit: Payer: Self-pay | Admitting: *Deleted

## 2015-05-03 DIAGNOSIS — Z01818 Encounter for other preprocedural examination: Secondary | ICD-10-CM

## 2015-05-03 LAB — PROTIME-INR
INR: 0.96 (ref <1.50)
Prothrombin Time: 12.8 seconds (ref 11.6–15.2)

## 2015-05-03 LAB — APTT: aPTT: 34 seconds (ref 24–37)

## 2015-05-04 ENCOUNTER — Encounter: Payer: Self-pay | Admitting: *Deleted

## 2015-05-05 ENCOUNTER — Ambulatory Visit: Payer: Self-pay | Admitting: Nurse Practitioner

## 2015-05-06 ENCOUNTER — Encounter (HOSPITAL_COMMUNITY)
Admission: RE | Disposition: A | Discharge status: Home / Self Care | Payer: Self-pay | Source: Ambulatory Visit | Attending: Cardiology

## 2015-05-06 ENCOUNTER — Other Ambulatory Visit (HOSPITAL_COMMUNITY): Payer: Self-pay

## 2015-05-06 ENCOUNTER — Ambulatory Visit (HOSPITAL_COMMUNITY)
Admission: RE | Admit: 2015-05-06 | Discharge: 2015-05-06 | Disposition: A | Discharge status: Home / Self Care | Payer: Medicare Other | Source: Ambulatory Visit | Attending: Cardiology | Admitting: Cardiology

## 2015-05-06 ENCOUNTER — Other Ambulatory Visit: Payer: Self-pay | Admitting: Cardiology

## 2015-05-06 DIAGNOSIS — Z955 Presence of coronary angioplasty implant and graft: Secondary | ICD-10-CM | POA: Diagnosis not present

## 2015-05-06 DIAGNOSIS — I739 Peripheral vascular disease, unspecified: Secondary | ICD-10-CM | POA: Insufficient documentation

## 2015-05-06 DIAGNOSIS — I255 Ischemic cardiomyopathy: Secondary | ICD-10-CM | POA: Diagnosis not present

## 2015-05-06 DIAGNOSIS — Z791 Long term (current) use of non-steroidal anti-inflammatories (NSAID): Secondary | ICD-10-CM | POA: Diagnosis not present

## 2015-05-06 DIAGNOSIS — R42 Dizziness and giddiness: Secondary | ICD-10-CM

## 2015-05-06 DIAGNOSIS — Z951 Presence of aortocoronary bypass graft: Secondary | ICD-10-CM | POA: Diagnosis not present

## 2015-05-06 DIAGNOSIS — R1900 Intra-abdominal and pelvic swelling, mass and lump, unspecified site: Secondary | ICD-10-CM | POA: Diagnosis not present

## 2015-05-06 DIAGNOSIS — I10 Essential (primary) hypertension: Secondary | ICD-10-CM | POA: Insufficient documentation

## 2015-05-06 DIAGNOSIS — R9439 Abnormal result of other cardiovascular function study: Secondary | ICD-10-CM | POA: Diagnosis present

## 2015-05-06 DIAGNOSIS — I25119 Atherosclerotic heart disease of native coronary artery with unspecified angina pectoris: Secondary | ICD-10-CM | POA: Insufficient documentation

## 2015-05-06 DIAGNOSIS — E663 Overweight: Secondary | ICD-10-CM | POA: Insufficient documentation

## 2015-05-06 DIAGNOSIS — I25709 Atherosclerosis of coronary artery bypass graft(s), unspecified, with unspecified angina pectoris: Secondary | ICD-10-CM | POA: Diagnosis not present

## 2015-05-06 DIAGNOSIS — J449 Chronic obstructive pulmonary disease, unspecified: Secondary | ICD-10-CM | POA: Diagnosis not present

## 2015-05-06 DIAGNOSIS — N139 Obstructive and reflux uropathy, unspecified: Secondary | ICD-10-CM | POA: Diagnosis not present

## 2015-05-06 DIAGNOSIS — Z7982 Long term (current) use of aspirin: Secondary | ICD-10-CM | POA: Diagnosis not present

## 2015-05-06 DIAGNOSIS — Z7902 Long term (current) use of antithrombotics/antiplatelets: Secondary | ICD-10-CM | POA: Insufficient documentation

## 2015-05-06 DIAGNOSIS — E119 Type 2 diabetes mellitus without complications: Secondary | ICD-10-CM | POA: Diagnosis not present

## 2015-05-06 DIAGNOSIS — E785 Hyperlipidemia, unspecified: Secondary | ICD-10-CM | POA: Diagnosis not present

## 2015-05-06 DIAGNOSIS — I272 Other secondary pulmonary hypertension: Secondary | ICD-10-CM | POA: Diagnosis not present

## 2015-05-06 DIAGNOSIS — I2510 Atherosclerotic heart disease of native coronary artery without angina pectoris: Secondary | ICD-10-CM

## 2015-05-06 DIAGNOSIS — Z6829 Body mass index (BMI) 29.0-29.9, adult: Secondary | ICD-10-CM | POA: Diagnosis not present

## 2015-05-06 DIAGNOSIS — Z01818 Encounter for other preprocedural examination: Secondary | ICD-10-CM

## 2015-05-06 DIAGNOSIS — R931 Abnormal findings on diagnostic imaging of heart and coronary circulation: Secondary | ICD-10-CM

## 2015-05-06 DIAGNOSIS — Z7951 Long term (current) use of inhaled steroids: Secondary | ICD-10-CM | POA: Diagnosis not present

## 2015-05-06 DIAGNOSIS — I2582 Chronic total occlusion of coronary artery: Secondary | ICD-10-CM | POA: Diagnosis not present

## 2015-05-06 DIAGNOSIS — Z8673 Personal history of transient ischemic attack (TIA), and cerebral infarction without residual deficits: Secondary | ICD-10-CM | POA: Insufficient documentation

## 2015-05-06 DIAGNOSIS — F17210 Nicotine dependence, cigarettes, uncomplicated: Secondary | ICD-10-CM | POA: Insufficient documentation

## 2015-05-06 DIAGNOSIS — I2584 Coronary atherosclerosis due to calcified coronary lesion: Secondary | ICD-10-CM | POA: Insufficient documentation

## 2015-05-06 HISTORY — PX: CARDIAC CATHETERIZATION: SHX172

## 2015-05-06 LAB — POCT I-STAT 3, VENOUS BLOOD GAS (G3P V)
Acid-Base Excess: 2 mmol/L (ref 0.0–2.0)
Bicarbonate: 28.7 mEq/L — ABNORMAL HIGH (ref 20.0–24.0)
O2 Saturation: 42 %
TCO2: 30 mmol/L (ref 0–100)
pCO2, Ven: 51.5 mmHg — ABNORMAL HIGH (ref 45.0–50.0)
pH, Ven: 7.354 — ABNORMAL HIGH (ref 7.250–7.300)
pO2, Ven: 25 mmHg — CL (ref 30.0–45.0)

## 2015-05-06 LAB — POCT I-STAT 3, ART BLOOD GAS (G3+)
Acid-Base Excess: 2 mmol/L (ref 0.0–2.0)
Bicarbonate: 25.5 mEq/L — ABNORMAL HIGH (ref 20.0–24.0)
Bicarbonate: 28.6 mEq/L — ABNORMAL HIGH (ref 20.0–24.0)
O2 Saturation: 49 %
O2 Saturation: 92 %
TCO2: 27 mmol/L (ref 0–100)
TCO2: 30 mmol/L (ref 0–100)
pCO2 arterial: 43.6 mmHg (ref 35.0–45.0)
pCO2 arterial: 50.7 mmHg — ABNORMAL HIGH (ref 35.0–45.0)
pH, Arterial: 7.359 (ref 7.350–7.450)
pH, Arterial: 7.375 (ref 7.350–7.450)
pO2, Arterial: 28 mmHg — CL (ref 80.0–100.0)
pO2, Arterial: 67 mmHg — ABNORMAL LOW (ref 80.0–100.0)

## 2015-05-06 LAB — GLUCOSE, CAPILLARY
Glucose-Capillary: 147 mg/dL — ABNORMAL HIGH (ref 65–99)
Glucose-Capillary: 104 mg/dL — ABNORMAL HIGH (ref 65–99)

## 2015-05-06 SURGERY — RIGHT/LEFT HEART CATH AND CORONARY/GRAFT ANGIOGRAPHY

## 2015-05-06 MED ORDER — SODIUM CHLORIDE 0.9 % IJ SOLN: 3.0000 mL | Freq: Two times a day (BID) | INTRAMUSCULAR | Status: DC

## 2015-05-06 MED ORDER — FENTANYL CITRATE (PF) 100 MCG/2ML IJ SOLN
INTRAMUSCULAR | Status: DC | PRN
  Administered 2015-05-06 (×2): 25 ug via INTRAVENOUS

## 2015-05-06 MED ORDER — SODIUM CHLORIDE 0.9 % IJ SOLN: 3.0000 mL | INTRAMUSCULAR | Status: DC | PRN

## 2015-05-06 MED ORDER — SODIUM CHLORIDE 0.9 % IV SOLN: 250.0000 mL | INTRAVENOUS | Status: DC | PRN

## 2015-05-06 MED ORDER — SODIUM CHLORIDE 0.9 % WEIGHT BASED INFUSION: 1.0000 mL/kg/h | INTRAVENOUS | Status: DC

## 2015-05-06 MED ORDER — MIDAZOLAM HCL 2 MG/2ML IJ SOLN
INTRAMUSCULAR | Status: AC
  Filled 2015-05-06: qty 4

## 2015-05-06 MED ORDER — ASPIRIN 81 MG PO CHEW: 81.0000 mg | CHEWABLE_TABLET | ORAL | Status: DC

## 2015-05-06 MED ORDER — LIDOCAINE HCL (PF) 1 % IJ SOLN
INTRAMUSCULAR | Status: DC | PRN
  Administered 2015-05-06: 15:00:00

## 2015-05-06 MED ORDER — CARVEDILOL 25 MG PO TABS: 25.0000 mg | ORAL_TABLET | Freq: Two times a day (BID) | ORAL | Status: DC

## 2015-05-06 MED ORDER — LIDOCAINE HCL (PF) 1 % IJ SOLN
INTRAMUSCULAR | Status: AC
  Filled 2015-05-06: qty 30

## 2015-05-06 MED ORDER — SODIUM CHLORIDE 0.9 % IV SOLN
INTRAVENOUS | Status: DC
  Administered 2015-05-06: 13:00:00 via INTRAVENOUS

## 2015-05-06 MED ORDER — IOHEXOL 350 MG/ML SOLN
INTRAVENOUS | Status: DC | PRN
  Administered 2015-05-06: 80 mL via INTRA_ARTERIAL

## 2015-05-06 MED ORDER — CARVEDILOL 12.5 MG PO TABS: 25.0000 mg | ORAL_TABLET | Freq: Two times a day (BID) | ORAL | Status: DC

## 2015-05-06 MED ORDER — FENTANYL CITRATE (PF) 100 MCG/2ML IJ SOLN
INTRAMUSCULAR | Status: AC
Start: 2015-05-06 — End: 2015-05-06
  Filled 2015-05-06: qty 4

## 2015-05-06 MED ORDER — MIDAZOLAM HCL 2 MG/2ML IJ SOLN
INTRAMUSCULAR | Status: DC | PRN
  Administered 2015-05-06: 2 mg via INTRAVENOUS
  Administered 2015-05-06: 1 mg via INTRAVENOUS

## 2015-05-06 SURGICAL SUPPLY — 11 items
CATH INFINITI 5FR ANG PIGTAIL (CATHETERS) ×2 IMPLANT
CATH INFINITI MULTIPACK ST 5F (CATHETERS) ×2 IMPLANT
CATH SWAN GANZ 7F STRAIGHT (CATHETERS) ×2 IMPLANT
KIT HEART LEFT (KITS) ×3 IMPLANT
KIT HEART RIGHT NAMIC (KITS) ×3 IMPLANT
PACK CARDIAC CATHETERIZATION (CUSTOM PROCEDURE TRAY) ×3 IMPLANT
SHEATH PINNACLE 5F 10CM (SHEATH) ×2 IMPLANT
SHEATH PINNACLE 7F 10CM (SHEATH) ×2 IMPLANT
SYR MEDRAD MARK V 150ML (SYRINGE) ×3 IMPLANT
TRANSDUCER W/STOPCOCK (MISCELLANEOUS) ×6 IMPLANT
WIRE EMERALD 3MM-J .035X150CM (WIRE) ×2 IMPLANT

## 2015-05-06 NOTE — Telephone Encounter (Signed)
Duplicate

## 2015-05-06 NOTE — H&P (View-Only) (Signed)
Patient ID: Diane [name], female   DOB: 01-24-1956, 59 y.o.   MRN: [id number]    Date:  05/02/2015   ID:  Diane [name], DOB 02/26/1956, MRN [id number]  PCP:  Loura Pardon, MD  Primary Cardiologist:  [place]  Chief Complaint  Patient presents with  . Follow-up  . Shortness of Breath    on exertion  . Edema    in legs, feet, and ankles/ pt states when Diabetic Dr prescribed her LASIX it went away  . Claudication    pt states she has pain and weakness when shes up and moving around  . Nausea    pt states she thinks its from the mass on her stomach; trying to get cardiac clearance for surgery     History of Present Illness: Diane [name] is a 59 y.o. female with a history of coronary disease and is status post CABG in March of 2007 by Dr. [name]. she presented later that year with recurrent angina. Repeat cardiac cath showed patent LIMA to the LAD but all other grafts occluded including SVG to OM, SVG to AC/PL, and radial graft to ramus intermediate. She then had complex stenting of the mid LCx and first OM with Taxus stents. The native RCA was occluded with collaterals. She has multiple cardiac risk factors including diabetes, dyslipidemia, hypertension, and tobacco abuse. She also has moderate bilateral carotid arterial disease. She continues to smoke.  Cardiac evaluation in Nov 2012 with a nuclear stress test showed that the inferior wall was obscured by uptake of the tracer in the bowel. There is no definite ischemia. Ejection fraction was reduced. However, an echocardiogram at that time revealed normal vigorous LV function.   She was seen in the office with Dr. Martinique on 04/12/2015 at which time she reported increased swelling in left abdomen. This lead to CT which showed a mass. TAH and BSO planned by Dr. Denman George. CA 125 normal. From a cardiac standpoint she does complain of chest pain on exertion. This includes walking or household chores. This is unchanged over the past year. More  recently she complained of swelling in her legs and increased dyspnea. She was placed on lasix and she lost 10 lbs and had resolution of these symptoms. No palpitations. No TIA or CVA symptoms. She complained that her legs are weak when she walks to mailbox.  Patient underwent nuclear stress testing and echocardiogram. The first was high risk with ejection fraction of 34%. There is a medium defect of moderate severity in the basal anterior septal basal inferior lateral and mid anteroseptal and mid inferior lateral locations. Ischemia was noted. Echocardiogram revealed an ejection fraction of 35% with Basal inferoseptal akinesis,basal to mid inferior akinesis, basal to mid inferolateral severehypokinesis, basal anterolateral hypokinesis. She continues to complain of angina with exertion and occasionally radiates to her jaw and arm. She does not have it every day.  She does sleep on 2-1/2 pillows because it helps her breathing. Lower extremity edema has resolved.  She reports nausea which is typically when she wakes up. She continues to smoke. She reports the mass in her abdomen appears to be getting bigger.  The patient currently denies  vomiting, fever, dizziness, PND, cough, congestion, hematochezia, melena, lower extremity edema, claudication.  Wt Readings from Last 3 Encounters:  05/02/15 141 lb 8 oz (64.184 kg)  04/27/15 143 lb (64.864 kg)  04/20/15 143 lb 9.6 oz (65.137 kg)     Past Medical History  Diagnosis Date  . Coronary  artery disease     post CABG in 3/07   . Hypertension   . Hyperlipidemia   . Dyslipidemia   . Diabetes mellitus     type 2  . Chronic bronchitis   . CVA (cerebral infarction) 1993  . Tobacco abuse   . COPD (chronic obstructive pulmonary disease)   . Stroke 1993  . Seizures   . Carotid stenosis   . Systolic murmur     known mild AS and MR    Current Outpatient Prescriptions  Medication Sig Dispense Refill  . acetaminophen-codeine (TYLENOL #3) 300-30 MG  per tablet Take 1 tablet by mouth every 4 (four) hours as needed for moderate pain.    [name] albuterol (PROVENTIL HFA;VENTOLIN HFA) 108 (90 BASE) MCG/ACT inhaler Inhale 2 puffs into the lungs every 6 (six) hours as needed for wheezing or shortness of breath. 1 Inhaler 5  . albuterol (PROVENTIL) (2.5 MG/3ML) 0.083% nebulizer solution USE 1 VIAL PER NEBULIZRE EVERY 6 HOURS AS NEEDED *HOSPITALIST DOESNT DO PA 75 mL 0  . aspirin 325 MG tablet Take 1 tablet (325 mg total) by mouth daily. 90 tablet 3  . carvedilol (COREG) 12.5 MG tablet Take 1 tablet (12.5 mg total) by mouth 2 (two) times daily with a meal. 180 tablet 3  . clopidogrel (PLAVIX) 75 MG tablet Take 1 tablet (75 mg total) by mouth daily. 90 tablet 0  . cyclobenzaprine (FLEXERIL) 10 MG tablet Take 10 mg by mouth at bedtime.     [name] DEXILANT 60 MG capsule TAKE 1 CAPSULE (60 MG TOTAL) BY MOUTH DAILY. 30 capsule 5  . diphenhydrAMINE (BENADRYL) 25 mg capsule Take 2 capsules in AM and 1 capsule at night (Patient taking differently: Take 25 mg by mouth 2 (two) times daily. ) 270 capsule 3  . fluticasone (FLONASE) 50 MCG/ACT nasal spray Place 2 sprays into both nostrils daily. 16 g 2  . furosemide (LASIX) 40 MG tablet Take 40 mg by mouth daily.     [name] gabapentin (NEURONTIN) 300 MG capsule TAKE 1 CAPSULE (300 MG TOTAL) BY MOUTH 3 (THREE) TIMES DAILY. 90 capsule 2  . glimepiride (AMARYL) 2 MG tablet Take 2 mg by mouth daily as needed (if sugar is above 140 take additional tab at bedtime).     [name] guaiFENesin (MUCINEX) 600 MG 12 hr tablet Take 2 tablets (1,200 mg total) by mouth 2 (two) times daily. 360 tablet 3  . hydrochlorothiazide (HYDRODIURIL) 50 MG tablet Take 1 tablet (50 mg total) by mouth daily. 30 tablet 2  . isosorbide mononitrate (IMDUR) 30 MG 24 hr tablet Take 1 tablet (30 mg total) by mouth 2 (two) times daily. 180 tablet 3  . levETIRAcetam (KEPPRA) 750 MG tablet Take 1 tablet (750 mg total) by mouth 2 (two) times daily. 60 tablet 11  . losartan  (COZAAR) 100 MG tablet Take 1 tablet (100 mg total) by mouth daily. 90 tablet 3  . metFORMIN (GLUCOPHAGE-XR) 500 MG 24 hr tablet Take 1,000 mg by mouth 2 (two) times daily.  6  . nitroGLYCERIN (NITROSTAT) 0.4 MG SL tablet Place 1 tablet (0.4 mg total) under the tongue every 5 (five) minutes as needed. 25 tablet 11  . ondansetron (ZOFRAN) 4 MG tablet Take 1 tablet (4 mg total) by mouth every 8 (eight) hours as needed for nausea or vomiting (take 1-2 tablets as needed every 8 hours for nausea). 20 tablet 0  . phenytoin (DILANTIN) 100 MG ER capsule Take 100 mg by mouth 2 (  two) times daily.    . potassium chloride (K-DUR) 10 MEQ tablet Take 1 tablet (10 mEq total) by mouth 2 (two) times daily. 180 tablet 3  . ranolazine (RANEXA) 500 MG 12 hr tablet Take 1 tablet (500 mg total) by mouth 2 (two) times daily. 60 tablet 2  . rosuvastatin (CRESTOR) 10 MG tablet Take 1 tablet (10 mg total) by mouth daily. 90 tablet 3  . VOLTAREN 1 % GEL Apply 2 g topically as needed (pain).      No current facility-administered medications for this visit.    Allergies:    Allergies  Allergen Reactions  . Ace Inhibitors     REACTION: cough  . Bee Venom   . Eggs Or Egg-Derived Products     Abdominal discomfort-no allergy She can get a flu shot   . Tetracycline     REACTION: reaction not known    Social History:  The patient  reports that she has been smoking Cigarettes.  She has a 54 pack-year smoking history. She has never used smokeless tobacco. She reports that she does not drink alcohol or use illicit drugs.   Family history:   Family History  Problem Relation Age of Onset  . Coronary artery disease    . Heart disease    . Heart disease Mother   . Diabetes Mother   . COPD Mother   . Hyperlipidemia Mother   . Hypertension Mother   . Cancer Father   . Drug abuse Paternal Grandmother   . Stroke Paternal Grandfather   . Cancer Paternal Aunt     lung with mets to brain  . Cancer Paternal Uncle     skin  cancer  . Cancer Paternal Uncle     lung/liver to brain  . Cancer Paternal Uncle     cancer of unknown type    ROS:  Please see the history of present illness.  All other systems reviewed and negative.   PHYSICAL EXAM: VS:  BP 114/62 mmHg  Pulse 106  Ht '4\' 10"'$  (1.473 m)  Wt 141 lb 8 oz (64.184 kg)  BMI 29.58 kg/m2  LMP 09/02/2003 Overweight well developed, in no acute distress HEENT: Pupils are equal round react to light accommodation extraocular movements are intact.  Neck: no JVDNo cervical lymphadenopathy. Cardiac: Regular rate and rhythm with 1/6 murmur RSB Lungs:  clear to auscultation bilaterally, no wheezing, rhonchi or rales Abd: soft on the right upper quadrant however mid abdomen is distended and firm, nontender, positive bowel sounds all quadrants,  Ext: no lower extremity edema.  2+ radial and dorsalis pedis pulses. Skin: warm and dry Neuro:  Grossly normal    ASSESSMENT AND PLAN:  Stable angina Status post high-risk nuclear stress test and echocardiogram revealing reduced ejection fraction compared to prior. She'll be scheduled for right and left heart catheterizations.  This is difficult situation given the patient needs to have abdominal surgery for a very large complex cystic lesion.  If she requires a stenting, a bare metal stent is recommended.  But even after that the patient will require three months on antiplatelet therapy.  I have instructed her NOT to stop the Plavix until we get the results of her heart cath. This lesion is causing obstructive uropathy likely from the mass effect.  We will be checking labs prior to cardiac catheterization.    Ischemic cardiomyopathy  patient's weight is stable. She appears euvolemic.   Tobacco abuse Patient continues to smoke.  Cessation discussed sounds  as though she is contemplating starting to use nicotine patches  Hyperlipidemia Continue statin.

## 2015-05-06 NOTE — Interval H&P Note (Signed)
History and Physical Interval Note:  05/06/2015 1:38 PM  Diane [name]  has presented today for surgery, with the diagnosis of abnormal nuclear stress test  The various methods of treatment have been discussed with the patient and family. After consideration of risks, benefits and other options for treatment, the patient has consented to  Procedure(s): Right/Left Heart Cath and Coronary/Graft Angiography (N/A) as a surgical intervention .  The patient's history has been reviewed, patient examined, no change in status, stable for surgery.  I have reviewed the patient's chart and labs.  Questions were answered to the patient's satisfaction.   Cath Lab Visit (complete for each Cath Lab visit)  Clinical Evaluation Leading to the Procedure:   ACS: No.  Non-ACS:    Anginal Classification: CCS III  Anti-ischemic medical therapy: Maximal Therapy (2 or more classes of medications)  Non-Invasive Test Results: High-risk stress test findings: cardiac mortality >3%/year  Prior CABG: Previous CABG        Collier Salina [facility] 05/06/2015 1:38 PM

## 2015-05-06 NOTE — Discharge Instructions (Addendum)
Increase carvedilol to 25 mg twice a day.  Angiogram, Care After Refer to this sheet in the next few weeks. These instructions provide you with information on caring for yourself after your procedure. Your health care provider may also give you more specific instructions. Your treatment has been planned according to current medical practices, but problems sometimes occur. Call your health care provider if you have any problems or questions after your procedure.  WHAT TO EXPECT AFTER THE PROCEDURE After your procedure, it is typical to have the following sensations:  Minor discomfort or tenderness and a small bump at the catheter insertion site. The bump should usually decrease in size and tenderness within 1 to 2 weeks.  Any bruising will usually fade within 2 to 4 weeks. HOME CARE INSTRUCTIONS   You may need to keep taking blood thinners if they were prescribed for you. Take medicines only as directed by your health care provider.  Do not apply powder or lotion to the site.  Do not take baths, swim, or use a hot tub until your health care provider approves.  You may shower 24 hours after the procedure. Remove the bandage (dressing) and gently wash the site with plain soap and water. Gently pat the site dry.  Inspect the site at least twice daily.  Limit your activity for the first 48 hours. Do not bend, squat, or lift anything over 20 lb (9 kg) or as directed by your health care provider.  Plan to have someone take you home after the procedure. Follow instructions about when you can drive or return to work. SEEK MEDICAL CARE IF:  You get light-headed when standing up.  You have drainage (other than a small amount of blood on the dressing).  You have chills.  You have a fever.  You have redness, warmth, swelling, or pain at the insertion site. SEEK IMMEDIATE MEDICAL CARE IF:   You develop chest pain or shortness of breath, feel faint, or pass out.  You have bleeding, swelling  larger than a walnut, or drainage from the catheter insertion site.  You develop pain, discoloration, coldness, or severe bruising in the leg or arm that held the catheter.  You develop bleeding from any other place, such as the bowels. You may see bright red blood in your urine or stools, or your stools may appear black and tarry.  You have heavy bleeding from the site. If this happens, hold pressure on the site. MAKE SURE YOU:  Understand these instructions.  Will watch your condition.  Will get help right away if you are not doing well or get worse. Document Released: 04/05/2005 Document Revised: 02/01/2014 Document Reviewed: 02/09/2013 [facility] Patient Information 2015 [place], [place]. This information is not intended to replace advice given to you by your health care provider. Make sure you discuss any questions you have with your health care provider.

## 2015-05-06 NOTE — Progress Notes (Signed)
Site area right groin a 5 french arterial and a 7 french venous sheath was removed  Site Prior to Removal:  Level 0  Pressure Applied For 15 MINUTES    Minutes Beginning at 1305p  Manual:   Yes.    Patient Status During Pull:  stable  Post Pull Groin Site:  Level 0  Post Pull Instructions Given:  Yes.    Post Pull Pulses Present:  Yes.    Dressing Applied:  Yes.    Comments:  VS remain stable during sheath pull.  Pt denies any discomfort at site at this time.

## 2015-05-09 ENCOUNTER — Encounter (HOSPITAL_COMMUNITY): Payer: Self-pay | Admitting: Cardiology

## 2015-05-09 ENCOUNTER — Telehealth: Payer: Self-pay | Admitting: Cardiology

## 2015-05-09 NOTE — Telephone Encounter (Signed)
Melissa states that Dr. Martinique called to speak with Dr. Denman George on Friday 05/06/15 regarding this patient.  Dr. Denman George is out of town this week--Melissa wanted to know if someone else could help Dr. Martinique or did he want to leave a message for Dr. Denman George.[name]

## 2015-05-10 ENCOUNTER — Other Ambulatory Visit: Payer: Self-pay

## 2015-05-10 NOTE — Patient Outreach (Signed)
[place] [facility]) Care Management  05/10/2015  Diane [name], Diane [name] [id number]   Telephone call to patient after cardiac cath last week.  Patient states she is doing well.  Some soreness to cath site.  Patient reports she did not have to get stents and she is glad about that. She now needs to find out about her status for her abdominal surgery. Patient states she will call Dr. Serita Grit Nurse Practitioner Lenna Sciara as Dr. Denman George is out of the country to get an update.  Patient states that Dr. Alycia Rossetti is to do the surgery with Dr. Denman George being out of the country.  Patient states she has a follow up appointment with the Cardiologist but not sure of the date but will call to find out.  Patient reports that her pain is good today 5/10 and can take tylenol with codeine if pain gets bad.  Patient voices no concerns.   Plan: RN Health Coach will contact patient within the next week for update about surgery.    Jone Baseman, RN, MSN [place] [phone]

## 2015-05-10 NOTE — Telephone Encounter (Signed)
Received a call from [facility] with [place].She was returning Dr.Jordan's call.Advised Dr.Jordan in Cath Lab today will send message to him.Stated he can call her back at [phone].

## 2015-05-12 ENCOUNTER — Encounter: Payer: Self-pay | Admitting: Gynecologic Oncology

## 2015-05-13 ENCOUNTER — Telehealth: Payer: Self-pay | Admitting: Gynecologic Oncology

## 2015-05-13 ENCOUNTER — Telehealth: Payer: Self-pay | Admitting: Cardiology

## 2015-05-13 NOTE — Telephone Encounter (Signed)
Left message for patient.  Informed her that I spoke with Dr. Martinique and she is a high surgical risk.  Advised she would be contacted on Monday after Dr. Denman George returns.  We will keep her scheduled for Aug 18 unless Dr. Denman George decides to change the plan.  Patient advised to call for any questions or concerns.

## 2015-05-13 NOTE — Telephone Encounter (Signed)
Returned call to [facility] Dr.Jordan advised

## 2015-05-13 NOTE — Telephone Encounter (Signed)
Dr.Jordan advised to hold plavix now for surgery.Advised to stay on aspirin 81 mg daily.

## 2015-05-13 NOTE — Progress Notes (Signed)
Spoke with Dr. Martinique about the patient and her upcoming surgery on August 18.  He stated she had severe CAD with only one graft open, low EF, with previous stents occluded.  She is not a candidate for revascularization.  Her filling pressure looked normal and he feels there is nothing to do do make things better from a cardiac standpoint.  She is high risk for surgery and would need telemetry post-op.  Advised Dr. Martinique that this would be discussed with Dr. Denman George on Monday when she returns and she will call him for any questions or concerns.

## 2015-05-13 NOTE — Telephone Encounter (Signed)
Spoke with Diane [name] about Dr. Doug Sou recommendations to hold her plavix now and to continue taking aspirin 81 mg.  Patient stating she takes aspirin '325mg'$ .  Returned call to Dr. Doug Sou RN.  Advised to have the patient take aspirin 81 mg and we would be contacted on Monday with further recommendations about the aspirin.  Pt advised that she would also be contacted from our office first thing on Monday with Dr. Serita Grit recommendations about surgery and aspirin.  No questions voiced.

## 2015-05-13 NOTE — Telephone Encounter (Signed)
Needs to speak with Malachy Mood regarding this patient.

## 2015-05-13 NOTE — Telephone Encounter (Signed)
She may hold her Plavix now for surgery. She should continue ASA 81 mg daily.  Craige Patel Martinique MD, [facility]

## 2015-05-13 NOTE — Telephone Encounter (Signed)
Returned call to [facility] with [place].She wanted to ask Dr.Jordan if ok for patient to hold aspirin and plavix before surgery next week.Message sent to [place] for advice.

## 2015-05-16 ENCOUNTER — Other Ambulatory Visit: Payer: Self-pay

## 2015-05-16 ENCOUNTER — Telehealth: Payer: Self-pay | Admitting: Gynecologic Oncology

## 2015-05-16 DIAGNOSIS — I509 Heart failure, unspecified: Secondary | ICD-10-CM

## 2015-05-16 NOTE — Patient Outreach (Signed)
[place] [facility]) Care Management  05/16/2015  Diane [name] 1956/07/11 [id number]  Returned phone call to patient.  She reports that her surgery is scheduled for 05-19-15 between 1:00 pm and 1:45pm  at [facility].  Patient to stay about 2 days post-op.  Patient states that she is ready to have the mass removed so that no damage is done to her kidney.  Patient reports that she has stopped her Plavix for the surgery and is taking 81 mg aspirin daily right now.   She voices no questions or concerns.    Plan: RN Health Coach will notify Diane [name] of admission for hospital liaison to follow.    Jone Baseman, RN, MSN [place] [phone]

## 2015-05-16 NOTE — Addendum Note (Signed)
Addended by: Golden Hurter D on: 05/16/2015 03:07 PM   Modules accepted: Orders, Medications

## 2015-05-16 NOTE — Patient Instructions (Addendum)
Sameria [place]s [place]  05/16/2015   Your procedure is scheduled on:    05/19/2015    Report to [facility] Main  Entrance take [place]  elevators to 3rd floor to  [place] at     1115 AM.  Call this number if you have problems the morning of surgery [phone]   Remember: ONLY 1 PERSON MAY GO WITH YOU TO SHORT STAY TO GET  READY MORNING OF [place].  Do not eat food or drink liquids :After Midnight. Clear liquid diet beginning on 05/18/2015 am      Take these medicines the morning of surgery with A SIP OF WATER:   Albuterol Inhaler if needed and bring, Nebulizer if needed, Carvedilol ( coreg), Dexilant, Flonase, Imdur, keppra, Dilantin , Ranexa                                You may not have any metal on your body including hair pins and              piercings  Do not wear jewelry, make-up, lotions, powders or perfumes, deodorant             Do not wear nail polish.  Do not shave  48 hours prior to surgery.                Do not bring valuables to the hospital. [place].  Contacts, dentures or bridgework may not be worn into surgery.  Leave suitcase in the car. After surgery it may be brought to your room.       Special Instructions: coughing and deep breathing exercises, leg exercises               Please read over the following fact sheets you were given: _____________________________________________________________________                CLEAR LIQUID DIET   Foods Allowed                                                                     Foods Excluded  Coffee and tea, regular and decaf                             liquids that you cannot  Plain Jell-O in any flavor                                             see through such as: Fruit ices (not with fruit pulp)                                     milk, soups, orange juice  Iced Popsicles  All solid  food Carbonated beverages, regular and diet                                    Cranberry, grape and apple juices Sports drinks like Gatorade Lightly seasoned clear broth or consume(fat free) Sugar, honey syrup  Sample Menu Breakfast                                Lunch                                     Supper Cranberry juice                    Beef broth                            Chicken broth Jell-O                                     Grape juice                           Apple juice Coffee or tea                        Jell-O                                      Popsicle                                                Coffee or tea                        Coffee or tea  _____________________________________________________________________  [facility] - Preparing for Surgery Before surgery, you can play an important role.  Because skin is not sterile, your skin needs to be as free of germs as possible.  You can reduce the number of germs on your skin by washing with CHG (chlorahexidine gluconate) soap before surgery.  CHG is an antiseptic cleaner which kills germs and bonds with the skin to continue killing germs even after washing. Please DO NOT use if you have an allergy to CHG or antibacterial soaps.  If your skin becomes reddened/irritated stop using the CHG and inform your nurse when you arrive at Short Stay. Do not shave (including legs and underarms) for at least 48 hours prior to the first CHG shower.  You may shave your face/neck. Please follow these instructions carefully:  1.  Shower with CHG Soap the night before surgery and the  morning of Surgery.  2.  If you choose to wash your hair, wash your hair first as usual with your  normal  shampoo.  3.  After you shampoo, rinse your hair and body thoroughly to remove the  shampoo.  4.  Use CHG as you would any other liquid soap.  You can apply chg directly  to the skin and wash                       Gently  with a scrungie or clean washcloth.  5.  Apply the CHG Soap to your body ONLY FROM THE NECK DOWN.   Do not use on face/ open                           Wound or open sores. Avoid contact with eyes, ears mouth and genitals (private parts).                       Wash face,  Genitals (private parts) with your normal soap.             6.  Wash thoroughly, paying special attention to the area where your surgery  will be performed.  7.  Thoroughly rinse your body with warm water from the neck down.  8.  DO NOT shower/wash with your normal soap after using and rinsing off  the CHG Soap.                9.  Pat yourself dry with a clean towel.            10.  Wear clean pajamas.            11.  Place clean sheets on your bed the night of your first shower and do not  sleep with pets. Day of Surgery : Do not apply any lotions/deodorants the morning of surgery.  Please wear clean clothes to the hospital/surgery center.  FAILURE TO FOLLOW THESE INSTRUCTIONS MAY RESULT IN THE CANCELLATION OF YOUR SURGERY PATIENT SIGNATURE_________________________________  NURSE SIGNATURE__________________________________  ________________________________________________________________________  WHAT IS A BLOOD TRANSFUSION? Blood Transfusion Information  A transfusion is the replacement of blood or some of its parts. Blood is made up of multiple cells which provide different functions.  Red blood cells carry oxygen and are used for blood loss replacement.  White blood cells fight against infection.  Platelets control bleeding.  Plasma helps clot blood.  Other blood products are available for specialized needs, such as hemophilia or other clotting disorders. BEFORE THE TRANSFUSION  Who gives blood for transfusions?   Healthy volunteers who are fully evaluated to make sure their blood is safe. This is blood bank blood. Transfusion therapy is the safest it has ever been in the practice of medicine. Before blood is  taken from a donor, a complete history is taken to make sure that person has no history of diseases nor engages in risky social behavior (examples are intravenous drug use or sexual activity with multiple partners). The donor's travel history is screened to minimize risk of transmitting infections, such as malaria. The donated blood is tested for signs of infectious diseases, such as HIV and hepatitis. The blood is then tested to be sure it is compatible with you in order to minimize the chance of a transfusion reaction. If you or a relative donates blood, this is often done in anticipation of surgery and is not appropriate for emergency situations. It takes many days to process the donated blood. RISKS AND COMPLICATIONS Although transfusion therapy is very safe and saves many lives, the main dangers of transfusion include:  1. Getting an infectious disease. 2. Developing a transfusion reaction.  This is an allergic reaction to something in the blood you were given. Every precaution is taken to prevent this. The decision to have a blood transfusion has been considered carefully by your caregiver before blood is given. Blood is not given unless the benefits outweigh the risks. AFTER THE TRANSFUSION  Right after receiving a blood transfusion, you will usually feel much better and more energetic. This is especially true if your red blood cells have gotten low (anemic). The transfusion raises the level of the red blood cells which carry oxygen, and this usually causes an energy increase.  The nurse administering the transfusion will monitor you carefully for complications. HOME CARE INSTRUCTIONS  No special instructions are needed after a transfusion. You may find your energy is better. Speak with your caregiver about any limitations on activity for underlying diseases you may have. SEEK MEDICAL CARE IF:   Your condition is not improving after your transfusion.  You develop redness or irritation at the  intravenous (IV) site. SEEK IMMEDIATE MEDICAL CARE IF:  Any of the following symptoms occur over the next 12 hours:  Shaking chills.  You have a temperature by mouth above 102 F (38.9 C), not controlled by medicine.  Chest, back, or muscle pain.  People around you feel you are not acting correctly or are confused.  Shortness of breath or difficulty breathing.  Dizziness and fainting.  You get a rash or develop hives.  You have a decrease in urine output.  Your urine turns a dark color or changes to pink, red, or brown. Any of the following symptoms occur over the next 10 days:  You have a temperature by mouth above 102 F (38.9 C), not controlled by medicine.  Shortness of breath.  Weakness after normal activity.  The white part of the eye turns yellow (jaundice).  You have a decrease in the amount of urine or are urinating less often.  Your urine turns a dark color or changes to pink, red, or brown. Document Released: 09/14/2000 Document Revised: 12/10/2011 Document Reviewed: 05/03/2008 Exit[employer] Patient Information 2014 [place].  _______________________________________________________________________  Incentive Spirometer  An incentive spirometer is a tool that can help keep your lungs clear and active. This tool measures how well you are filling your lungs with each breath. Taking long deep breaths may help reverse or decrease the chance of developing breathing (pulmonary) problems (especially infection) following:  A long period of time when you are unable to move or be active. BEFORE THE PROCEDURE   If the spirometer includes an indicator to show your best effort, your nurse or respiratory therapist will set it to a desired goal.  If possible, sit up straight or lean slightly forward. Try not to slouch.  Hold the incentive spirometer in an upright position. INSTRUCTIONS FOR USE  3. Sit on the edge of your bed if possible, or sit up as far as you can  in bed or on a chair. 4. Hold the incentive spirometer in an upright position. 5. Breathe out normally. 6. Place the mouthpiece in your mouth and seal your lips tightly around it. 7. Breathe in slowly and as deeply as possible, raising the piston or the ball toward the top of the column. 8. Hold your breath for 3-5 seconds or for as long as possible. Allow the piston or ball to fall to the bottom of the column. 9. Remove the mouthpiece from your mouth and breathe out normally. 10. Rest for a few seconds and repeat Steps  1 through 7 at least 10 times every 1-2 hours when you are awake. Take your time and take a few normal breaths between deep breaths. 11. The spirometer may include an indicator to show your best effort. Use the indicator as a goal to work toward during each repetition. 12. After each set of 10 deep breaths, practice coughing to be sure your lungs are clear. If you have an incision (the cut made at the time of surgery), support your incision when coughing by placing a pillow or rolled up towels firmly against it. Once you are able to get out of bed, walk around indoors and cough well. You may stop using the incentive spirometer when instructed by your caregiver.  RISKS AND COMPLICATIONS  Take your time so you do not get dizzy or light-headed.  If you are in pain, you may need to take or ask for pain medication before doing incentive spirometry. It is harder to take a deep breath if you are having pain. AFTER USE  Rest and breathe slowly and easily.  It can be helpful to keep track of a log of your progress. Your caregiver can provide you with a simple table to help with this. If you are using the spirometer at home, follow these instructions: [place] IF:   You are having difficultly using the spirometer.  You have trouble using the spirometer as often as instructed.  Your pain medication is not giving enough relief while using the spirometer.  You develop fever  of 100.5 F (38.1 C) or higher. SEEK IMMEDIATE MEDICAL CARE IF:   You cough up bloody sputum that had not been present before.  You develop fever of 102 F (38.9 C) or greater.  You develop worsening pain at or near the incision site. MAKE SURE YOU:   Understand these instructions.  Will watch your condition.  Will get help right away if you are not doing well or get worse. Document Released: 01/28/2007 Document Revised: 12/10/2011 Document Reviewed: 03/31/2007 [facility] Patient Information 2014 [place], [place].   ________________________________________________________________________

## 2015-05-16 NOTE — Telephone Encounter (Signed)
Returned call to [place] spoke to her nurse Erasmo Downer Dr.Jordan advised to stay on aspirin 81 mg daily.Do not take aspirin 325 mg.

## 2015-05-16 NOTE — Telephone Encounter (Addendum)
Spoke with the patient about upcoming surgery and Dr. Serita Grit recommendations.  Advised to continue taking aspirin 81 mg before surgery.  Patient understanding she is very high risk for surgery and agreeable to proceed.  Dr. Denman George has reviewed Dr. Doug Sou cardiac evaluation and assessment as well.  Patient is to take aspirin 81 mg the morning of surgery and no lovenox will be given pre-op per Dr. Denman George.

## 2015-05-17 ENCOUNTER — Ambulatory Visit
Admission: RE | Admit: 2015-05-17 | Discharge: 2015-05-17 | Disposition: A | Discharge status: Home / Self Care | Payer: Medicare Other | Source: Ambulatory Visit | Attending: Obstetrics and Gynecology | Admitting: Obstetrics and Gynecology

## 2015-05-17 ENCOUNTER — Encounter (HOSPITAL_COMMUNITY): Payer: Self-pay

## 2015-05-17 ENCOUNTER — Other Ambulatory Visit: Payer: Self-pay | Admitting: Obstetrics and Gynecology

## 2015-05-17 ENCOUNTER — Other Ambulatory Visit: Payer: Self-pay

## 2015-05-17 ENCOUNTER — Encounter (HOSPITAL_COMMUNITY)
Admission: RE | Admit: 2015-05-17 | Discharge: 2015-05-17 | Disposition: A | Discharge status: Home / Self Care | Payer: Medicare Other | Source: Ambulatory Visit | Attending: Gynecologic Oncology | Admitting: Gynecologic Oncology

## 2015-05-17 DIAGNOSIS — R928 Other abnormal and inconclusive findings on diagnostic imaging of breast: Secondary | ICD-10-CM

## 2015-05-17 DIAGNOSIS — R921 Mammographic calcification found on diagnostic imaging of breast: Secondary | ICD-10-CM

## 2015-05-17 DIAGNOSIS — N6489 Other specified disorders of breast: Secondary | ICD-10-CM | POA: Diagnosis not present

## 2015-05-17 DIAGNOSIS — R920 Mammographic microcalcification found on diagnostic imaging of breast: Secondary | ICD-10-CM | POA: Diagnosis not present

## 2015-05-17 HISTORY — DX: Headache: R51

## 2015-05-17 HISTORY — DX: Pneumonia, unspecified organism: J18.9

## 2015-05-17 HISTORY — DX: Anemia, unspecified: D64.9

## 2015-05-17 HISTORY — DX: Reserved for inherently not codable concepts without codable children: IMO0001

## 2015-05-17 HISTORY — DX: Sleep apnea, unspecified: G47.30

## 2015-05-17 HISTORY — DX: Acute myocardial infarction, unspecified: I21.9

## 2015-05-17 HISTORY — DX: Headache, unspecified: R51.9

## 2015-05-17 HISTORY — DX: Gastro-esophageal reflux disease without esophagitis: K21.9

## 2015-05-17 HISTORY — DX: Fibromyalgia: M79.7

## 2015-05-17 LAB — COMPREHENSIVE METABOLIC PANEL
ALT: 10 U/L — ABNORMAL LOW (ref 14–54)
AST: 15 U/L (ref 15–41)
Albumin: 3.9 g/dL (ref 3.5–5.0)
Alkaline Phosphatase: 62 U/L (ref 38–126)
Anion gap: 8 (ref 5–15)
BUN: 12 mg/dL (ref 6–20)
CO2: 30 mmol/L (ref 22–32)
Calcium: 9.1 mg/dL (ref 8.9–10.3)
Chloride: 94 mmol/L — ABNORMAL LOW (ref 101–111)
Creatinine, Ser: 0.59 mg/dL (ref 0.44–1.00)
GFR calc Af Amer: >60 mL/min (ref >60)
GFR calc non Af Amer: >60 mL/min (ref >60)
Glucose, Bld: 117 mg/dL — ABNORMAL HIGH (ref 65–99)
Potassium: 3.7 mmol/L (ref 3.5–5.1)
Sodium: 132 mmol/L — ABNORMAL LOW (ref 135–145)
Total Bilirubin: 0.2 mg/dL — ABNORMAL LOW (ref 0.3–1.2)
Total Protein: 7.2 g/dL (ref 6.5–8.1)

## 2015-05-17 LAB — URINALYSIS, ROUTINE W REFLEX MICROSCOPIC
Bilirubin Urine: NEGATIVE
Glucose, UA: NEGATIVE mg/dL
Hgb urine dipstick: NEGATIVE
Ketones, ur: NEGATIVE mg/dL
Leukocytes, UA: NEGATIVE
Nitrite: NEGATIVE
Protein, ur: NEGATIVE mg/dL
Specific Gravity, Urine: 1.014 (ref 1.005–1.030)
Urobilinogen, UA: 0.2 mg/dL (ref 0.0–1.0)
pH: 8 (ref 5.0–8.0)

## 2015-05-17 LAB — CBC WITH DIFFERENTIAL/PLATELET
Basophils Absolute: 0 10*3/uL (ref 0.0–0.1)
Basophils Relative: 1 % (ref 0–1)
Eosinophils Absolute: 0.1 10*3/uL (ref 0.0–0.7)
Eosinophils Relative: 1 % (ref 0–5)
HCT: 40.1 % (ref 36.0–46.0)
Hemoglobin: 12.5 g/dL (ref 12.0–15.0)
Lymphocytes Relative: 22 % (ref 12–46)
Lymphs Abs: 1.2 10*3/uL (ref 0.7–4.0)
MCH: 23.5 pg — ABNORMAL LOW (ref 26.0–34.0)
MCHC: 31.2 g/dL (ref 30.0–36.0)
MCV: 75.4 fL — ABNORMAL LOW (ref 78.0–100.0)
Monocytes Absolute: 0.5 10*3/uL (ref 0.1–1.0)
Monocytes Relative: 10 % (ref 3–12)
Neutro Abs: 3.6 10*3/uL (ref 1.7–7.7)
Neutrophils Relative %: 66 % (ref 43–77)
Platelets: 250 10*3/uL (ref 150–400)
RBC: 5.32 MIL/uL — ABNORMAL HIGH (ref 3.87–5.11)
RDW: 19.1 % — ABNORMAL HIGH (ref 11.5–15.5)
WBC: 5.4 10*3/uL (ref 4.0–10.5)

## 2015-05-17 LAB — ABO/RH: ABO/RH(D): O POS

## 2015-05-17 NOTE — Progress Notes (Addendum)
EKG- 04/12/15- EPIC  CXR-05/02/2015-EPIC  Stress 04/27/15-EPIC  CATH_ 05/06/2015- EPIC  PFT- 06/2014- EPIC  ECHO- 04/25/15-EPIC  CLEarance for surgery and plavix instructions in cath note per Dr Peter Martinique

## 2015-05-17 NOTE — Progress Notes (Signed)
Called OB/GYN office and spoke with Bella Kennedy , RN and informed her that consent needed to be corrected in EPIC.  Consent form corrected after patient had left.  New consent form on front of chart.

## 2015-05-18 ENCOUNTER — Telehealth: Payer: Self-pay

## 2015-05-18 MED ORDER — CARVEDILOL 25 MG PO TABS: 25.0000 mg | ORAL_TABLET | Freq: Two times a day (BID) | ORAL | Status: DC

## 2015-05-18 NOTE — Telephone Encounter (Signed)
Received a call from patient requesting 90 day coreg refill.Refill sent to pharmacy.

## 2015-05-18 NOTE — Progress Notes (Signed)
Spoke with Dr Myrtie Soman ( anesthesia) regarding patient history.  Anesthesia was given cardiac cath report.  No further orders given   Placed on chart most recent EKG along with cath report, echo report and stress test along with telephone encounters.

## 2015-05-19 ENCOUNTER — Inpatient Hospital Stay (HOSPITAL_COMMUNITY): Payer: Medicare Other | Admitting: Anesthesiology

## 2015-05-19 ENCOUNTER — Inpatient Hospital Stay (HOSPITAL_COMMUNITY)
Admission: RE | Admit: 2015-05-19 | Discharge: 2015-05-22 | DRG: 742 | Disposition: A | Discharge status: Home / Self Care | Payer: Medicare Other | Source: Ambulatory Visit | Attending: Gynecologic Oncology | Admitting: Gynecologic Oncology

## 2015-05-19 ENCOUNTER — Encounter (HOSPITAL_COMMUNITY)
Admission: RE | Disposition: A | Discharge status: Home / Self Care | Payer: Self-pay | Source: Ambulatory Visit | Attending: Gynecologic Oncology

## 2015-05-19 ENCOUNTER — Inpatient Hospital Stay (HOSPITAL_COMMUNITY): Payer: Medicare Other

## 2015-05-19 ENCOUNTER — Encounter (HOSPITAL_COMMUNITY): Payer: Self-pay | Admitting: Gynecologic Oncology

## 2015-05-19 DIAGNOSIS — Z9889 Other specified postprocedural states: Secondary | ICD-10-CM | POA: Diagnosis not present

## 2015-05-19 DIAGNOSIS — G9341 Metabolic encephalopathy: Secondary | ICD-10-CM | POA: Diagnosis not present

## 2015-05-19 DIAGNOSIS — Z8673 Personal history of transient ischemic attack (TIA), and cerebral infarction without residual deficits: Secondary | ICD-10-CM

## 2015-05-19 DIAGNOSIS — K660 Peritoneal adhesions (postprocedural) (postinfection): Secondary | ICD-10-CM | POA: Diagnosis present

## 2015-05-19 DIAGNOSIS — R1907 Generalized intra-abdominal and pelvic swelling, mass and lump: Secondary | ICD-10-CM | POA: Diagnosis not present

## 2015-05-19 DIAGNOSIS — Z881 Allergy status to other antibiotic agents status: Secondary | ICD-10-CM | POA: Diagnosis not present

## 2015-05-19 DIAGNOSIS — Z91030 Bee allergy status: Secondary | ICD-10-CM

## 2015-05-19 DIAGNOSIS — I5032 Chronic diastolic (congestive) heart failure: Secondary | ICD-10-CM | POA: Diagnosis present

## 2015-05-19 DIAGNOSIS — D649 Anemia, unspecified: Secondary | ICD-10-CM | POA: Diagnosis not present

## 2015-05-19 DIAGNOSIS — Z955 Presence of coronary angioplasty implant and graft: Secondary | ICD-10-CM

## 2015-05-19 DIAGNOSIS — Z981 Arthrodesis status: Secondary | ICD-10-CM

## 2015-05-19 DIAGNOSIS — Z808 Family history of malignant neoplasm of other organs or systems: Secondary | ICD-10-CM

## 2015-05-19 DIAGNOSIS — Z801 Family history of malignant neoplasm of trachea, bronchus and lung: Secondary | ICD-10-CM | POA: Diagnosis not present

## 2015-05-19 DIAGNOSIS — Z8249 Family history of ischemic heart disease and other diseases of the circulatory system: Secondary | ICD-10-CM

## 2015-05-19 DIAGNOSIS — Z91012 Allergy to eggs: Secondary | ICD-10-CM

## 2015-05-19 DIAGNOSIS — R1900 Intra-abdominal and pelvic swelling, mass and lump, unspecified site: Secondary | ICD-10-CM | POA: Diagnosis not present

## 2015-05-19 DIAGNOSIS — I509 Heart failure, unspecified: Secondary | ICD-10-CM | POA: Diagnosis not present

## 2015-05-19 DIAGNOSIS — M797 Fibromyalgia: Secondary | ICD-10-CM | POA: Diagnosis present

## 2015-05-19 DIAGNOSIS — E119 Type 2 diabetes mellitus without complications: Secondary | ICD-10-CM | POA: Diagnosis present

## 2015-05-19 DIAGNOSIS — Z951 Presence of aortocoronary bypass graft: Secondary | ICD-10-CM | POA: Diagnosis not present

## 2015-05-19 DIAGNOSIS — Z79899 Other long term (current) drug therapy: Secondary | ICD-10-CM

## 2015-05-19 DIAGNOSIS — K219 Gastro-esophageal reflux disease without esophagitis: Secondary | ICD-10-CM | POA: Diagnosis present

## 2015-05-19 DIAGNOSIS — Z888 Allergy status to other drugs, medicaments and biological substances status: Secondary | ICD-10-CM

## 2015-05-19 DIAGNOSIS — J95821 Acute postprocedural respiratory failure: Secondary | ICD-10-CM

## 2015-05-19 DIAGNOSIS — I679 Cerebrovascular disease, unspecified: Secondary | ICD-10-CM | POA: Diagnosis present

## 2015-05-19 DIAGNOSIS — Z7982 Long term (current) use of aspirin: Secondary | ICD-10-CM | POA: Diagnosis not present

## 2015-05-19 DIAGNOSIS — Z4682 Encounter for fitting and adjustment of non-vascular catheter: Secondary | ICD-10-CM | POA: Diagnosis not present

## 2015-05-19 DIAGNOSIS — I6529 Occlusion and stenosis of unspecified carotid artery: Secondary | ICD-10-CM | POA: Diagnosis present

## 2015-05-19 DIAGNOSIS — Z01812 Encounter for preprocedural laboratory examination: Secondary | ICD-10-CM

## 2015-05-19 DIAGNOSIS — Z833 Family history of diabetes mellitus: Secondary | ICD-10-CM | POA: Diagnosis not present

## 2015-05-19 DIAGNOSIS — F17210 Nicotine dependence, cigarettes, uncomplicated: Secondary | ICD-10-CM | POA: Diagnosis present

## 2015-05-19 DIAGNOSIS — G4733 Obstructive sleep apnea (adult) (pediatric): Secondary | ICD-10-CM | POA: Diagnosis present

## 2015-05-19 DIAGNOSIS — J9690 Respiratory failure, unspecified, unspecified whether with hypoxia or hypercapnia: Secondary | ICD-10-CM

## 2015-05-19 DIAGNOSIS — Z823 Family history of stroke: Secondary | ICD-10-CM

## 2015-05-19 DIAGNOSIS — E876 Hypokalemia: Secondary | ICD-10-CM | POA: Diagnosis not present

## 2015-05-19 DIAGNOSIS — I252 Old myocardial infarction: Secondary | ICD-10-CM

## 2015-05-19 DIAGNOSIS — Z9911 Dependence on respirator [ventilator] status: Secondary | ICD-10-CM | POA: Diagnosis not present

## 2015-05-19 DIAGNOSIS — D271 Benign neoplasm of left ovary: Secondary | ICD-10-CM | POA: Diagnosis not present

## 2015-05-19 DIAGNOSIS — E785 Hyperlipidemia, unspecified: Secondary | ICD-10-CM | POA: Diagnosis present

## 2015-05-19 DIAGNOSIS — N1330 Unspecified hydronephrosis: Secondary | ICD-10-CM | POA: Diagnosis present

## 2015-05-19 DIAGNOSIS — Z825 Family history of asthma and other chronic lower respiratory diseases: Secondary | ICD-10-CM | POA: Diagnosis not present

## 2015-05-19 DIAGNOSIS — I2510 Atherosclerotic heart disease of native coronary artery without angina pectoris: Secondary | ICD-10-CM | POA: Diagnosis present

## 2015-05-19 DIAGNOSIS — J449 Chronic obstructive pulmonary disease, unspecified: Secondary | ICD-10-CM | POA: Diagnosis present

## 2015-05-19 DIAGNOSIS — Z7901 Long term (current) use of anticoagulants: Secondary | ICD-10-CM | POA: Diagnosis not present

## 2015-05-19 DIAGNOSIS — I10 Essential (primary) hypertension: Secondary | ICD-10-CM | POA: Diagnosis present

## 2015-05-19 DIAGNOSIS — D279 Benign neoplasm of unspecified ovary: Secondary | ICD-10-CM | POA: Diagnosis not present

## 2015-05-19 HISTORY — PX: LAPAROTOMY: SHX154

## 2015-05-19 LAB — COMPREHENSIVE METABOLIC PANEL
ALT: 19 U/L (ref 14–54)
AST: 38 U/L (ref 15–41)
Albumin: 2.7 g/dL — ABNORMAL LOW (ref 3.5–5.0)
Alkaline Phosphatase: 43 U/L (ref 38–126)
Anion gap: 7 (ref 5–15)
BUN: 10 mg/dL (ref 6–20)
CO2: 25 mmol/L (ref 22–32)
Calcium: 7.7 mg/dL — ABNORMAL LOW (ref 8.9–10.3)
Chloride: 101 mmol/L (ref 101–111)
Creatinine, Ser: 0.59 mg/dL (ref 0.44–1.00)
GFR calc Af Amer: >60 mL/min (ref >60)
GFR calc non Af Amer: >60 mL/min (ref >60)
Glucose, Bld: 187 mg/dL — ABNORMAL HIGH (ref 65–99)
Potassium: 2.7 mmol/L — CL (ref 3.5–5.1)
Sodium: 133 mmol/L — ABNORMAL LOW (ref 135–145)
Total Bilirubin: 0.6 mg/dL (ref 0.3–1.2)
Total Protein: 4.8 g/dL — ABNORMAL LOW (ref 6.5–8.1)

## 2015-05-19 LAB — TYPE AND SCREEN
ABO/RH(D): O POS
Antibody Screen: NEGATIVE

## 2015-05-19 LAB — BLOOD GAS, ARTERIAL
Acid-Base Excess: 0.4 mmol/L (ref 0.0–2.0)
Bicarbonate: 25.2 mEq/L — ABNORMAL HIGH (ref 20.0–24.0)
Drawn by: 308601
FIO2: 1
MECHVT: 500 mL
O2 Saturation: 98.5 %
PEEP: 5 cmH2O
Patient temperature: 98.6
RATE: 18 resp/min
TCO2: 23 mmol/L (ref 0–100)
pCO2 arterial: 43.7 mmHg (ref 35.0–45.0)
pH, Arterial: 7.379 (ref 7.350–7.450)
pO2, Arterial: 324 mmHg — ABNORMAL HIGH (ref 80.0–100.0)

## 2015-05-19 LAB — GLUCOSE, CAPILLARY
Glucose-Capillary: 173 mg/dL — ABNORMAL HIGH (ref 65–99)
Glucose-Capillary: 137 mg/dL — ABNORMAL HIGH (ref 65–99)
Glucose-Capillary: 216 mg/dL — ABNORMAL HIGH (ref 65–99)

## 2015-05-19 LAB — LACTIC ACID, PLASMA: Lactic Acid, Venous: 0.7 mmol/L (ref 0.5–2.0)

## 2015-05-19 LAB — MRSA PCR SCREENING: MRSA by PCR: NEGATIVE

## 2015-05-19 LAB — TROPONIN I: Troponin I: <0.03 ng/mL (ref <0.031)

## 2015-05-19 SURGERY — LAPAROTOMY, EXPLORATORY
Anesthesia: General | Site: Abdomen | Laterality: Bilateral

## 2015-05-19 MED ORDER — FENTANYL CITRATE (PF) 250 MCG/5ML IJ SOLN
INTRAMUSCULAR | Status: AC
  Filled 2015-05-19: qty 25

## 2015-05-19 MED ORDER — ROCURONIUM BROMIDE 100 MG/10ML IV SOLN
INTRAVENOUS | Status: DC | PRN
  Administered 2015-05-19: 50 mg via INTRAVENOUS
  Administered 2015-05-19: 30 mg via INTRAVENOUS
  Administered 2015-05-19: 20 mg via INTRAVENOUS
  Administered 2015-05-19: 30 mg via INTRAVENOUS
  Administered 2015-05-19: 100 mg via INTRAVENOUS

## 2015-05-19 MED ORDER — FENTANYL CITRATE (PF) 100 MCG/2ML IJ SOLN
INTRAMUSCULAR | Status: AC
  Filled 2015-05-19: qty 2

## 2015-05-19 MED ORDER — ETOMIDATE 2 MG/ML IV SOLN
INTRAVENOUS | Status: AC
  Filled 2015-05-19: qty 10

## 2015-05-19 MED ORDER — ROCURONIUM BROMIDE 100 MG/10ML IV SOLN
INTRAVENOUS | Status: AC
  Filled 2015-05-19: qty 1

## 2015-05-19 MED ORDER — PHENYLEPHRINE 40 MCG/ML (10ML) SYRINGE FOR IV PUSH (FOR BLOOD PRESSURE SUPPORT)
PREFILLED_SYRINGE | INTRAVENOUS | Status: AC
  Filled 2015-05-19: qty 10

## 2015-05-19 MED ORDER — LIDOCAINE HCL (CARDIAC) 20 MG/ML IV SOLN
INTRAVENOUS | Status: DC | PRN
  Administered 2015-05-19: 50 mg via INTRAVENOUS

## 2015-05-19 MED ORDER — PHENYTOIN SODIUM 50 MG/ML IJ SOLN
100.0000 mg | Freq: Two times a day (BID) | INTRAMUSCULAR | Status: DC
  Administered 2015-05-19: 100 mg via INTRAVENOUS
  Filled 2015-05-19 (×2): qty 2

## 2015-05-19 MED ORDER — LACTATED RINGERS IV SOLN
INTRAVENOUS | Status: DC
  Administered 2015-05-19: 1000 mL via INTRAVENOUS

## 2015-05-19 MED ORDER — ONDANSETRON HCL 4 MG/2ML IJ SOLN
INTRAMUSCULAR | Status: DC | PRN
  Administered 2015-05-19: 4 mg via INTRAVENOUS

## 2015-05-19 MED ORDER — DEXTROSE 5 % IV SOLN
2.0000 g | Freq: Once | INTRAVENOUS | Status: AC
  Administered 2015-05-19: 2 g via INTRAVENOUS

## 2015-05-19 MED ORDER — FENTANYL CITRATE (PF) 100 MCG/2ML IJ SOLN
INTRAMUSCULAR | Status: DC | PRN
  Administered 2015-05-19: 50 ug via INTRAVENOUS
  Administered 2015-05-19: 150 ug via INTRAVENOUS
  Administered 2015-05-19: 250 ug via INTRAVENOUS
  Administered 2015-05-19: 150 ug via INTRAVENOUS
  Administered 2015-05-19: 100 ug via INTRAVENOUS
  Administered 2015-05-19: 250 ug via INTRAVENOUS
  Administered 2015-05-19 (×2): 100 ug via INTRAVENOUS
  Administered 2015-05-19: 50 ug via INTRAVENOUS
  Administered 2015-05-19 (×2): 100 ug via INTRAVENOUS
  Administered 2015-05-19 (×2): 50 ug via INTRAVENOUS

## 2015-05-19 MED ORDER — LACTATED RINGERS IV SOLN
INTRAVENOUS | Status: DC | PRN
  Administered 2015-05-19 (×2): via INTRAVENOUS

## 2015-05-19 MED ORDER — MIDAZOLAM HCL 2 MG/2ML IJ SOLN
INTRAMUSCULAR | Status: AC
  Filled 2015-05-19: qty 4

## 2015-05-19 MED ORDER — FENTANYL CITRATE (PF) 100 MCG/2ML IJ SOLN
25.0000 ug | INTRAMUSCULAR | Status: DC | PRN
  Administered 2015-05-19 (×3): 50 ug via INTRAVENOUS
  Administered 2015-05-20: 100 ug via INTRAVENOUS
  Administered 2015-05-20 (×2): 50 ug via INTRAVENOUS
  Administered 2015-05-20: 100 ug via INTRAVENOUS
  Filled 2015-05-19 (×5): qty 2

## 2015-05-19 MED ORDER — BUPIVACAINE LIPOSOME 1.3 % IJ SUSP
INTRAMUSCULAR | Status: DC | PRN
  Administered 2015-05-19: 20 mL

## 2015-05-19 MED ORDER — DEXMEDETOMIDINE HCL IN NACL 200 MCG/50ML IV SOLN
0.4000 ug/kg/h | INTRAVENOUS | Status: DC
  Administered 2015-05-19: 0.4 ug/kg/h via INTRAVENOUS
  Filled 2015-05-19: qty 50

## 2015-05-19 MED ORDER — ONDANSETRON HCL 4 MG PO TABS
4.0000 mg | ORAL_TABLET | Freq: Four times a day (QID) | ORAL | Status: DC | PRN
Start: 2015-05-19 — End: 2015-05-22

## 2015-05-19 MED ORDER — MIDAZOLAM HCL 5 MG/5ML IJ SOLN
INTRAMUSCULAR | Status: DC | PRN
  Administered 2015-05-19 (×4): 1 mg via INTRAVENOUS

## 2015-05-19 MED ORDER — FENTANYL CITRATE (PF) 250 MCG/5ML IJ SOLN
INTRAMUSCULAR | Status: AC
Start: 2015-05-19 — End: 2015-05-19
  Filled 2015-05-19: qty 25

## 2015-05-19 MED ORDER — PHENYLEPHRINE HCL 10 MG/ML IJ SOLN
INTRAMUSCULAR | Status: DC | PRN
  Administered 2015-05-19 (×3): 40 ug via INTRAVENOUS

## 2015-05-19 MED ORDER — KCL IN DEXTROSE-NACL 20-5-0.45 MEQ/L-%-% IV SOLN
INTRAVENOUS | Status: DC
  Administered 2015-05-19 – 2015-05-20 (×3): via INTRAVENOUS
  Filled 2015-05-19 (×3): qty 1000

## 2015-05-19 MED ORDER — ANTISEPTIC ORAL RINSE SOLUTION (CORINZ)
7.0000 mL | Freq: Four times a day (QID) | OROMUCOSAL | Status: DC
  Administered 2015-05-20 – 2015-05-21 (×4): 7 mL via OROMUCOSAL

## 2015-05-19 MED ORDER — LACTATED RINGERS IV SOLN: INTRAVENOUS | Status: DC

## 2015-05-19 MED ORDER — INSULIN ASPART 100 UNIT/ML ~~LOC~~ SOLN
0.0000 [IU] | SUBCUTANEOUS | Status: DC
  Administered 2015-05-19: 5 [IU] via SUBCUTANEOUS
  Administered 2015-05-19: 3 [IU] via SUBCUTANEOUS
  Administered 2015-05-20: 2 [IU] via SUBCUTANEOUS
  Administered 2015-05-20 (×3): 3 [IU] via SUBCUTANEOUS
  Administered 2015-05-20 – 2015-05-21 (×2): 2 [IU] via SUBCUTANEOUS
  Administered 2015-05-21: 3 [IU] via SUBCUTANEOUS

## 2015-05-19 MED ORDER — HEPARIN SODIUM (PORCINE) 1000 UNIT/ML IJ SOLN
INTRAMUSCULAR | Status: AC
  Filled 2015-05-19: qty 1

## 2015-05-19 MED ORDER — SODIUM CHLORIDE 0.9 % IV BOLUS (SEPSIS)
1000.0000 mL | Freq: Once | INTRAVENOUS | Status: AC
  Administered 2015-05-19: 1000 mL via INTRAVENOUS

## 2015-05-19 MED ORDER — PANTOPRAZOLE SODIUM 40 MG IV SOLR
40.0000 mg | Freq: Every day | INTRAVENOUS | Status: DC
  Administered 2015-05-19: 40 mg via INTRAVENOUS
  Filled 2015-05-19 (×2): qty 40

## 2015-05-19 MED ORDER — ENOXAPARIN SODIUM 40 MG/0.4ML ~~LOC~~ SOLN
40.0000 mg | SUBCUTANEOUS | Status: DC
  Administered 2015-05-20 – 2015-05-22 (×3): 40 mg via SUBCUTANEOUS
  Filled 2015-05-19 (×3): qty 0.4

## 2015-05-19 MED ORDER — POTASSIUM CHLORIDE 10 MEQ/50ML IV SOLN
10.0000 meq | INTRAVENOUS | Status: AC
  Administered 2015-05-19 – 2015-05-20 (×4): 10 meq via INTRAVENOUS
  Filled 2015-05-19: qty 50

## 2015-05-19 MED ORDER — BUPIVACAINE LIPOSOME 1.3 % IJ SUSP
20.0000 mL | Freq: Once | INTRAMUSCULAR | Status: DC
  Filled 2015-05-19: qty 20

## 2015-05-19 MED ORDER — CEFAZOLIN SODIUM-DEXTROSE 2-3 GM-% IV SOLR
INTRAVENOUS | Status: AC
  Filled 2015-05-19: qty 50

## 2015-05-19 MED ORDER — CHLORHEXIDINE GLUCONATE 0.12% ORAL RINSE (MEDLINE KIT)
15.0000 mL | Freq: Two times a day (BID) | OROMUCOSAL | Status: DC
Start: 2015-05-19 — End: 2015-05-22
  Administered 2015-05-19 – 2015-05-21 (×4): 15 mL via OROMUCOSAL

## 2015-05-19 MED ORDER — SODIUM CHLORIDE 0.9 % IV SOLN
750.0000 mg | Freq: Two times a day (BID) | INTRAVENOUS | Status: DC
  Administered 2015-05-19: 750 mg via INTRAVENOUS
  Filled 2015-05-19 (×2): qty 7.5

## 2015-05-19 MED ORDER — CEFAZOLIN SODIUM-DEXTROSE 2-3 GM-% IV SOLR
2.0000 g | INTRAVENOUS | Status: AC
  Administered 2015-05-19: 2 g via INTRAVENOUS

## 2015-05-19 MED ORDER — ALBUTEROL SULFATE (2.5 MG/3ML) 0.083% IN NEBU: 2.5000 mg | INHALATION_SOLUTION | RESPIRATORY_TRACT | Status: DC | PRN

## 2015-05-19 MED ORDER — 0.9 % SODIUM CHLORIDE (POUR BTL) OPTIME
TOPICAL | Status: DC | PRN
  Administered 2015-05-19: 4000 mL

## 2015-05-19 MED ORDER — SODIUM CHLORIDE 0.9 % IJ SOLN
INTRAMUSCULAR | Status: AC
  Filled 2015-05-19: qty 20

## 2015-05-19 MED ORDER — ETOMIDATE 2 MG/ML IV SOLN
INTRAVENOUS | Status: DC | PRN
  Administered 2015-05-19: 6 mg via INTRAVENOUS

## 2015-05-19 MED ORDER — DEXTROSE 5 % IV SOLN
INTRAVENOUS | Status: AC
  Filled 2015-05-19: qty 2

## 2015-05-19 MED ORDER — ONDANSETRON HCL 4 MG/2ML IJ SOLN
4.0000 mg | Freq: Four times a day (QID) | INTRAMUSCULAR | Status: DC | PRN
Start: 2015-05-19 — End: 2015-05-22

## 2015-05-19 MED ORDER — PROPOFOL 10 MG/ML IV BOLUS
INTRAVENOUS | Status: AC
  Filled 2015-05-19: qty 20

## 2015-05-19 SURGICAL SUPPLY — 57 items
ATTRACTOMAT 16X20 MAGNETIC DRP (DRAPES) IMPLANT
BLADE EXTENDED COATED 6.5IN (ELECTRODE) ×1 IMPLANT
CHLORAPREP W/TINT 26ML (MISCELLANEOUS) ×2 IMPLANT
CLIP TI MEDIUM LARGE 6 (CLIP) IMPLANT
CONT SPEC 4OZ CLIKSEAL STRL BL (MISCELLANEOUS) ×1 IMPLANT
COVER SURGICAL LIGHT HANDLE (MISCELLANEOUS) ×2 IMPLANT
DRAPE UTILITY 15X26 (DRAPE) ×2 IMPLANT
DRAPE WARM FLUID 44X44 (DRAPE) ×2 IMPLANT
DRESSING TELFA ISLAND 4X8 (GAUZE/BANDAGES/DRESSINGS) ×2 IMPLANT
DRSG OPSITE POSTOP 4X8 (GAUZE/BANDAGES/DRESSINGS) ×1 IMPLANT
ELECT LIGASURE SHORT 9 REUSE (ELECTRODE) IMPLANT
ELECT REM PT RETURN 9FT ADLT (ELECTROSURGICAL) ×2
ELECTRODE REM PT RTRN 9FT ADLT (ELECTROSURGICAL) ×1 IMPLANT
GAUZE SPONGE 4X4 12PLY STRL (GAUZE/BANDAGES/DRESSINGS) IMPLANT
GAUZE SPONGE 4X4 16PLY XRAY LF (GAUZE/BANDAGES/DRESSINGS) IMPLANT
GLOVE BIO SURGEON STRL SZ 6 (GLOVE) ×4 IMPLANT
GLOVE BIO SURGEON STRL SZ 6.5 (GLOVE) ×4 IMPLANT
GLOVE BIOGEL PI IND STRL 7.5 (GLOVE) IMPLANT
GLOVE BIOGEL PI INDICATOR 7.5 (GLOVE) ×1
GLOVE SURG SS PI 7.5 STRL IVOR (GLOVE) ×1 IMPLANT
GOWN STRL REUS W/ TWL LRG LVL3 (GOWN DISPOSABLE) ×2 IMPLANT
GOWN STRL REUS W/TWL 2XL LVL3 (GOWN DISPOSABLE) ×1 IMPLANT
GOWN STRL REUS W/TWL LRG LVL3 (GOWN DISPOSABLE) ×4
KIT BASIN OR (CUSTOM PROCEDURE TRAY) ×2 IMPLANT
LIQUID BAND (GAUZE/BANDAGES/DRESSINGS) IMPLANT
LOOP VESSEL MAXI BLUE (MISCELLANEOUS) IMPLANT
NEEDLE HYPO 22GX1.5 SAFETY (NEEDLE) ×4 IMPLANT
NS IRRIG 1000ML POUR BTL (IV SOLUTION) ×8 IMPLANT
PACK GENERAL/GYN (CUSTOM PROCEDURE TRAY) ×2 IMPLANT
RELOAD PROXIMATE 75MM BLUE (ENDOMECHANICALS) ×2 IMPLANT
RELOAD STAPLE 75 3.8 BLU REG (ENDOMECHANICALS) IMPLANT
SHEET LAVH (DRAPES) ×2 IMPLANT
SPONGE LAP 18X18 X RAY DECT (DISPOSABLE) ×2 IMPLANT
STAPLER GUN LINEAR PROX 60 (STAPLE) ×1 IMPLANT
STAPLER PROXIMATE 75MM BLUE (STAPLE) ×1 IMPLANT
STAPLER VISISTAT (STAPLE) ×1 IMPLANT
STAPLER VISISTAT 35W (STAPLE) IMPLANT
SURGIFLO W/THROMBIN 8M KIT (HEMOSTASIS) ×2 IMPLANT
SUT MNCRL AB 4-0 PS2 18 (SUTURE) IMPLANT
SUT PDS AB 1 TP1 96 (SUTURE) ×4 IMPLANT
SUT PROLENE 1 CT 1 30 (SUTURE) ×2 IMPLANT
SUT SILK 3 0 SH CR/8 (SUTURE) ×1 IMPLANT
SUT VIC AB 0 CT1 36 (SUTURE) ×6 IMPLANT
SUT VIC AB 2-0 CT1 36 (SUTURE) ×4 IMPLANT
SUT VIC AB 2-0 CT2 27 (SUTURE) ×12 IMPLANT
SUT VIC AB 3-0 CTX 36 (SUTURE) IMPLANT
SUT VIC AB 3-0 SH 27 (SUTURE) ×4
SUT VIC AB 3-0 SH 27XBRD (SUTURE) IMPLANT
SUT VICRYL 2 0 18  UND BR (SUTURE) ×2
SUT VICRYL 2 0 18 UND BR (SUTURE) IMPLANT
SYR 20CC LL (SYRINGE) ×4 IMPLANT
SYRINGE 1CC 27X.5 TB SAFETYGLD (MISCELLANEOUS) ×1 IMPLANT
TOWEL OR 17X26 10 PK STRL BLUE (TOWEL DISPOSABLE) ×2 IMPLANT
TOWEL OR NON WOVEN STRL DISP B (DISPOSABLE) ×2 IMPLANT
TRAY FOLEY W/METER SILVER 14FR (SET/KITS/TRAYS/PACK) ×2 IMPLANT
TRAY FOLEY W/METER SILVER 16FR (SET/KITS/TRAYS/PACK) ×1 IMPLANT
WATER STERILE IRR 1500ML POUR (IV SOLUTION) ×2 IMPLANT

## 2015-05-19 NOTE — Progress Notes (Signed)
Patient transported to ICU and placed on PRVC 500/18/+5/100%.  There were no complications on transport and RN was getting patient setup on ICU monitors. RT will continue to monitor.

## 2015-05-19 NOTE — Progress Notes (Signed)
Pt states used nitriglycerin this am about  0900, states she uses it almost everyday and especially when she has to exert herself. [place] in [place] notified

## 2015-05-19 NOTE — H&P (Signed)
CC:  Chief Complaint  Patient presents with  . Pelvic Mass    New patient    Assessment/Plan:  [name] is a 59 y.o. year old with a very large left adomino-pelvic mass, suspected to be from left ovary. It is associated with mild hydroureter, but no signs of metastatic ovarian cancer. CA 125 tumor marking assay is low and normal at 7. It is minimally symptomatic. It is arising in the setting of multiple medical comorbidities including coronary disease, CVD, COPD. She has been characterized as high risk for cardiac events by her cardiologist who performed a recent coronary angiogram. She has been on continuous ASA therapy preoperatively at their request.  I performed a history, physical examination, and personally reviewed the patient's imaging films including the CT abdomen and pelvis from 6./27/16.  I discussed with the patient that my suspicion for malignancy is actually lower given the large size of the mass without additional features such as ascites, peritoneal carcinomatosis or lymphadenopathy. I discussed that malignancy is a possibility, but more likely a borderline tumor or a mucinous or low grade tumor. I discussed that regardless, I am recommending surgery (exploratory laparotomy and BSO with frozen section) because her cyst is very large and compressing the left ureter and symptomatic.  I discussed that her risk of major bleeding complications is elevated while on (or recently on) anti-platelet therapy. We will ensure she is type and crossed for 2 units PRBC. I discussed that she is also at other substantial risk due to her DM and tobacco use and COPD. I discussed increase risk of wound infection, dehiscence, reoperation and wound packing. We discussed her increased risk for perioperative cardiac or pulmonary events, readmission and reoperation which are all elevated due to her comorbidities. She is at increased risk for needing a blood transfusion due to her  anti-platelet therapy use. She will have planned admission to the ICU postop in an intubated and sedated state.  If occult cancer is found on frozen section, we will likely NOT perform staging or debulking due to her high perioperative cardiac risk, and would simply remove the mass and contralateral ovary, then give chemotherapy.   HPI: Diane [name] is a 59 year old gravida 0 who is seen in consultation at the request of Dr. Charlesetta Garibaldi for a 25 cm mostly cystic pelvic mass arising from likely the left ovary. The patient has had symptoms of intermittent nausea for the past month. She is also noted distention of the abdomen. She reported this to her provider who scheduled her for a CT scan of the abdomen and pelvis with contrast. This was performed on 03/28/2015. It revealed mild left hydronephrosis associated with obstruction from a 24 x 25 x 16 cm cystic mass in the central abdomen and pelvis with multiple internal septations. The mass was asymmetric towards the left anterior abdominal wall. There was no ascites. There was no lymphadenopathy. There is no omental cake or carcinomatosis.  Follow-up ultrasound scan was performed by Dr. delighted on 03/30/2015. This revealed a small normal uterus measuring 5.4 x 4.1 x 2.9 cm with a thin endometrial stripe of 1.3 mm. A large cystic mass was also confirmed arising from the pelvis.  Of note Diane [name] has multiple medical comorbidities. She has a history of a myocardial infarction in 2007 for which she received a 5 vessel CABG. 6 months after this surgery she required repeat catheterization and placement of a coronary stent. She takes Plavix and aspirin for her coronary disease. She reports to  me exhaustion including shortness of breath and feeling weak when she attempts to walk up a fight of stairs. She is also limited by knee pain in scaling stairs. She endorses mild chest pains with walking upstairs. She reports chest pains every other day requiring nitroglycerin use  (which relieves the symptoms). Her cardiologist, Dr. Driscilla Moats, performed preoperative evaluation and coronary angiogram and felt that she was at high risk for this surgical procedure but was optimized as much as could be. She was recommended to stay on ASA perioperatively.  Diane [name] has a history of substantial tobacco abuse. She smokes 1-1/2 packs per day and has so since age 61. She has been successful in discontinuing smoking for only a few months after history of a CVA remotely.  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 She has ongoing deficits with balance abnormalities.  She is history of obstructive sleep apnea, however does not use her BiPAP at present. She is due for repeat sleep studies.  She also has a history of diabetes mellitus, and reports that her most recent HbA1c was mild elevated at 7.1%.  She also has a substantial surgical history with a prior low transverse laparotomy for opening up of the fallopian tubes due to primary infertility and possibly a history of pelvic inflammatory disease. She also has a history of an open appendectomy and open cholecystectomy. She has a history of a coronary artery bypass grafts.    Current Meds:  Outpatient Encounter Prescriptions as of 04/11/2015  Medication Sig  . albuterol (PROVENTIL HFA;VENTOLIN HFA) 108 (90 BASE) MCG/ACT inhaler Inhale 2 puffs into the lungs every 6 (six) hours as needed for wheezing or shortness of breath.  [name] albuterol (PROVENTIL) (2.5 MG/3ML) 0.083% nebulizer solution USE 1 VIAL PER NEBULIZRE EVERY 6 HOURS AS NEEDED *HOSPITALIST DOESNT DO PA  . aspirin 325 MG tablet Take 1 tablet (325 mg total) by mouth daily.  . carvedilol (COREG) 12.5 MG tablet Take 1 tablet (12.5 mg total) by mouth 2 (two) times daily with a meal.  . cetirizine (ZYRTEC) 10 MG tablet Take 1 tablet (10 mg total) by mouth daily.  .  clopidogrel (PLAVIX) 75 MG tablet Take 1 tablet (75 mg total) by mouth daily.  . cyclobenzaprine (FLEXERIL) 10 MG tablet   . DEXILANT 60 MG capsule TAKE 1 CAPSULE (60 MG TOTAL) BY MOUTH DAILY.  . diphenhydrAMINE (BENADRYL) 25 mg capsule Take 2 capsules in AM and 1 capsule at night  . DULoxetine (CYMBALTA) 60 MG capsule   . fluticasone (FLONASE) 50 MCG/ACT nasal spray Place 2 sprays into both nostrils daily.  . furosemide (LASIX) 40 MG tablet   . gabapentin (NEURONTIN) 300 MG capsule TAKE 1 CAPSULE (300 MG TOTAL) BY MOUTH 3 (THREE) TIMES DAILY.  [name] glimepiride (AMARYL) 2 MG tablet   . guaiFENesin (MUCINEX) 600 MG 12 hr tablet Take 2 tablets (1,200 mg total) by mouth 2 (two) times daily.  . hydrochlorothiazide (HYDRODIURIL) 50 MG tablet Take 1 tablet (50 mg total) by mouth daily.  . isosorbide mononitrate (IMDUR) 30 MG 24 hr tablet Take 1 tablet (30 mg total) by mouth 2 (two) times daily.  [name] levETIRAcetam (KEPPRA) 750 MG tablet Take 1 tablet (750 mg total) by mouth 2 (two) times daily.  [name] LORazepam (ATIVAN) 0.5 MG tablet Take 0.25 mg by mouth every 6 (six) hours as needed for anxiety.  [name] losartan (COZAAR)  100 MG tablet Take 1 tablet (100 mg total) by mouth daily.  . metFORMIN (GLUCOPHAGE-XR) 500 MG 24 hr tablet   . nitroGLYCERIN (NITROSTAT) 0.4 MG SL tablet Place 1 tablet (0.4 mg total) under the tongue every 5 (five) minutes as needed.  . Omega-3 Fatty Acids ([employer] NATURAL FISH OIL) 1200 MG CAPS Take 1 capsule by mouth 2 (two) times daily.  . ONE TOUCH ULTRA TEST test strip 3 (three) times daily. for testing  . phenytoin (DILANTIN) 100 MG ER capsule Take 100 mg by mouth 2 (two) times daily.  . potassium chloride (K-DUR) 10 MEQ tablet Take 1 tablet (10 mEq total) by mouth 2 (two) times daily.  . ranolazine (RANEXA) 500 MG 12 hr tablet Take 1 tablet (500 mg total) by mouth 2 (two) times daily.  . rosuvastatin (CRESTOR) 10 MG tablet  Take 1 tablet (10 mg total) by mouth daily.  . VOLTAREN 1 % GEL   . zoster vaccine live, PF, (ZOSTAVAX) 35701 UNT/0.65ML injection Inject 19,400 Units into the skin once.  . Calcium 1500 MG tablet Take 1 tablet (1,500 mg total) by mouth daily. (Patient not taking: Reported on 03/28/2015)  . Cholecalciferol ([employer] VITAMIN D3) 1000 UNITS capsule Take 1 capsule (1,000 Units total) by mouth daily. (Patient not taking: Reported on 03/28/2015)   No facility-administered encounter medications on file as of 04/11/2015.    Allergy:  Allergies  Allergen Reactions  . Ace Inhibitors     REACTION: cough  . Bee Venom   . Eggs Or Egg-Derived Products     Abdominal discomfort-no allergy She can get a flu shot   . Tetracycline     REACTION: reaction not known    Social Hx:  History   Social History  . Marital Status: Divorced    Spouse Name: N/A  . Number of Children: N/A  . Years of Education: N/A   Occupational History  . Not on file.   Social History Main Topics  . Smoking status: Current Every Day Smoker -- 1.50 packs/day for 36 years    Types: Cigarettes  . Smokeless tobacco: Never Used     Comment: 1.5-2 ppd patien is aware that she needs to quit   . Alcohol Use: No  . Drug Use: No  . Sexual Activity:    Partners: Male    Birth Control/ Protection: None   Other Topics Concern  . Not on file   Social History Narrative    Past Surgical Hx:  Past Surgical History  Procedure Laterality Date  . Coronary stent placement  08/11/06    PCI of her ciurcumflex/OM vessel  . Cardiac catheterization  11/29/05    EF of 55%  . Cardiac catheterization  08/06/06    EF of 45-50%  . Coronary artery bypass graft  12/04/2005    x5 -- left internal mammary artery to the LAD, left radial artery to the ramus intermedius, saphenous vein graft to the obtuse marginal  1, sequential saphenous vein grat to the acute marginal and posterior descending, endoscopic vein harvesting from the left thigh with open vein harvest from right leg  . Cholecystectomy    . Appendectomy    . Cervical fusion  1990    Past Medical Hx:  Past Medical History  Diagnosis Date  . Coronary artery disease     post CABG in 3/07   . Hypertension   . Hyperlipidemia   . Dyslipidemia   . Diabetes mellitus     type 2  .  Chronic bronchitis   . CVA (cerebral infarction) 1993  . Tobacco abuse   . COPD (chronic obstructive pulmonary disease)   . Stroke 1993  . Seizures   . Carotid stenosis   . 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 Last pap last week (pending), remote hx of cervical excisional procedure. 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 Patient's last menstrual period was 09/02/2003.  Family Hx:  Family History  Problem Relation Age of Onset  . Coronary artery disease    . Heart disease    . Heart disease Mother   . Diabetes Mother   . COPD Mother   . Hyperlipidemia Mother   . Hypertension Mother   . Cancer Father   . Drug abuse Paternal Grandmother   . Stroke Paternal Grandfather   . Cancer Paternal Aunt     lung with mets to brain  . Cancer Paternal Uncle     skin cancer  . Cancer Paternal Uncle     lung/liver to brain  . Cancer Paternal Uncle     cancer of unknown type    Review of Systems:  Constitutional  Feels well,  ENT Normal appearing ears and nares bilaterally Skin/Breast  No rash, sores, jaundice, itching, dryness Cardiovascular  + chest pain, shortness of breath, no edema  Pulmonary  + cough and wheeze.  Gastro Intestinal  + nausea, No vomitting, or diarrhoea. No bright red blood per rectum, no abdominal pain, change  in bowel movement, or constipation.  Genito Urinary  No frequency, urgency, dysuria, no postmenopausal bleeding or abnormal discharge Musculo Skeletal  + fibromyalgia, myalgia, arthralgia, Neurologic  + balance abnormalities Psychology  No depression, anxiety, insomnia.   BP 100/61 mmHg  Pulse 71  Temp(Src) 98 F (36.7 C) (Oral)  Resp 16  Ht '4\' 10"'$  (1.473 m)  Wt 142 lb (64.411 kg)  BMI 29.69 kg/m2  SpO2 97%  LMP 09/02/2003   Physical Exam: WD in NAD Neck  Supple NROM, without any enlargements.  Lymph Node Survey No cervical supraclavicular or inguinal adenopathy Cardiovascular  Pulse normal rate, regularity and rhythm. S1 and S2 normal.  Lungs  Clear to auscultation bilateraly, without wheezes/crackles/rhonchi. Good air movement.  Skin  No rash/lesions/breakdown  Psychiatry  Alert and oriented to person, place, and time  Abdomen  Normoactive bowel sounds, abdomen soft, non-tender and overweight, large distenstion of abdomen with cystic mass extending from pelvis on left into upper left abdomen. without evidence of hernia. Back No CVA tenderness Genito Urinary  Vulva/vagina: Normal external female genitalia. No lesions. No discharge or bleeding. Bladder/urethra: No lesions or masses, well supported bladder Vagina: nromal Cervix: normal to palpate but unable to see secondary to displacement from mass Uterus: Small, mobile, no parametrial involvement or nodularity. Adnexa: large cystic mobile abdomino-pelvic mass without nodularity.  Rectal  Good tone, no masses no cul de sac nodularity.  Extremities  No bilateral cyanosis, clubbing or edema.  Donaciano Eva, MD

## 2015-05-19 NOTE — Progress Notes (Addendum)
[facility] Progress Note Patient Name: Diane [name] DOB: Jun 12, 1956 MRN: [id number]   Date of Service  05/19/2015  HPI/Events of Note  K+ = 2.7 and Creatinine = 0.59.  eICU Interventions  Replete K+.     Intervention Category Major Interventions: Respiratory failure - evaluation and management Intermediate Interventions: Electrolyte abnormality - evaluation and management  Sommer,Steven Eugene 05/19/2015, 10:36 PM

## 2015-05-19 NOTE — Anesthesia Preprocedure Evaluation (Signed)
Anesthesia Evaluation  Patient identified by MRN, date of birth, ID band Patient awake    Reviewed: Allergy & Precautions, NPO status , Patient's Chart, lab work & pertinent test results  History of Anesthesia Complications (+) PONV  Airway Mallampati: II  TM Distance: >3 FB Neck ROM: Full    Dental no notable dental hx. (+) Poor Dentition   Pulmonary sleep apnea , COPDCurrent Smoker,  breath sounds clear to auscultation  Pulmonary exam normal       Cardiovascular hypertension, Pt. on medications + CAD, + Past MI and +CHF Normal cardiovascular examRhythm:Regular Rate:Normal     Neuro/Psych Seizures -,  Anxiety Depression CVA    GI/Hepatic negative GI ROS, Neg liver ROS,   Endo/Other  diabetes, Type 2, Oral Hypoglycemic Agents  Renal/GU negative Renal ROS  negative genitourinary   Musculoskeletal negative musculoskeletal ROS (+)   Abdominal   Peds negative pediatric ROS (+)  Hematology negative hematology ROS (+)   Anesthesia Other Findings   Reproductive/Obstetrics negative OB ROS                             Anesthesia Physical Anesthesia Plan  ASA: IV  Anesthesia Plan: General   Post-op Pain Management:    Induction: Intravenous  Airway Management Planned: Oral ETT  Additional Equipment: Arterial line, CVP and Ultrasound Guidance Line Placement  Intra-op Plan:   Post-operative Plan: Post-operative intubation/ventilation  Informed Consent: I have reviewed the patients History and Physical, chart, labs and discussed the procedure including the risks, benefits and alternatives for the proposed anesthesia with the patient or authorized representative who has indicated his/her understanding and acceptance.   Dental advisory given  Plan Discussed with: CRNA  Anesthesia Plan Comments: (Very high risk for cardiac complications discussed with patient extensively. She wishes to  proceed, as does Dr. Denman George.  Plan cardiac anesthetic with post op ventilation. Aline. Cordis.)        Anesthesia Quick Evaluation

## 2015-05-19 NOTE — Progress Notes (Signed)
e[facility] Physician-Brief Progress Note Patient Name: Diane [name] DOB: 1956-01-05 MRN: [id number]   Date of Service  05/19/2015  HPI/Events of Note  Patient arrives in ICU intubated and mechanically ventilated post op.   eICU Interventions  Will order: 1. Ventilator settings: 100%/PRVC 18/TV 500/P 5. 2. ABG at 8 PM. 3. Portable CXR now.  4. Precedex IV infusion for sedation. Can't use Propofol d/t egg product allergy.      Intervention Category Major Interventions: Respiratory failure - evaluation and management  Dorinda Stehr Eugene 05/19/2015, 7:25 PM

## 2015-05-19 NOTE — Anesthesia Postprocedure Evaluation (Signed)
  Anesthesia Post-op Note  Patient: Diane [name]  Procedure(s) Performed: Procedure(s): EXPLORATORY LAPAROTOMY WITH BILATERAL SALPINGO OOPHORECTOMY /OMENTECTOMY/SEGMENTAL SIGMOID COLECTOMY  (Bilateral)  Patient Location: PACU and ICU  Anesthesia Type:General  Level of Consciousness: sedated and Patient remains intubated per anesthesia plan  Airway and Oxygen Therapy: Patient remains intubated per anesthesia plan and Patient placed on Ventilator (see vital sign flow sheet for setting)  Post-op Pain: unable to assess  Post-op Assessment: Post-op Vital signs reviewed, Patient's Cardiovascular Status Stable and Respiratory Function Stable              Post-op Vital Signs: Reviewed and stable  Last Vitals:  Filed Vitals:   05/19/15 1104  BP: 100/61  Pulse: 71  Temp: 36.7 C  Resp: 16    Complications: No apparent anesthesia complications

## 2015-05-19 NOTE — Op Note (Signed)
OPERATIVE NOTE 05/19/15  Preoperative Diagnosis: Pelvic mass   Postoperative Diagnosis:Ovarian cystadenofibroma   Procedure(s) Performed: Exploratory laparotomy with bilateral salpingo-oophorectomy, omentectomy sigmoid colectomy with reanastamosis.  (22 modifier secondary to significant dense adhesions between sigmoid colon and mesentery and mass increasing duration of procedure by 1 hour, and complex medical comorbidities necessitating central monitoring and increasing the duration of the procedure)  Surgeon: Thereasa Solo, MD.  Assistant Surgeon: Dr Lahoma Crocker, M.D. Assistant: (an MD assistant was necessary for tissue manipulation, retraction and positioning due to the complexity of the case and hospital policies).   Specimens: Bilateral tubes / ovaries, omentum. Sigmoid colon.   Estimated Blood Loss: 250 mL.    Urine Output: 650cc  IVF: 2000cc crystalloid.  Complications: None.   Operative Findings: 25cm left ovarian cyst, retroperitoneal, densely adherent to the sigmoid colon mesentery with the sigmoid colon draped over the mass. Benign cystadenofibroma on frozen section. Omental adhesions to the anterior abdominal wall.  Dusky segment of mid sigmoid colon and associated mesentery with decreased vascular perfusion -resected.  Procedure:   The patient was seen in the Holding Room. The risks, benefits, complications, treatment options, and expected outcomes were discussed with the patient.  The patient concurred with the proposed plan, giving informed consent.   The patient was  identified as Diane [name]  and the procedure verified as BSO, omentectomy, tumor debulking. A Time Out was held and the above information confirmed upon entry to the operating room. A radial A line and IJ central line were placed by anesthesia to obtain central monitoring throughout given her cardiac history and high risk status.  After induction of anesthesia, the patient was draped and prepped in the  usual sterile manner.  She was prepped and draped in the normal sterile fashion in the dorsal lithotomy position in padded Allen stirrups with good attention paid to support of the lower back and lower extremities. Position was adjusted for appropriate support. A Foley catheter was placed to gravity.   A right para-median vertical incision at the site of her prior incision for appendectomy was made and carried through the subcutaneous tissue to the fascia. The fascial incision was made sharply and extended superiorally. The rectus muscles were separated. The peritoneum was identified and entered sharply with a scalpel. Peritoneal incision was extended longitudinally.  The abdominal cavity was entered sharply and without incident. The omental adhesions were identified to the anterior abdominal wall. Using sharp dissection and the bovie, these were separated from the anteiror abdominal wall. In doing so, a segment of devitalized right sided omentum was resected from the patients. A survey of the abdomen and pelvis revealed the above findings, which were significant for an extremely large left ovarian benign appearing cyst which was growing retroperitoneally/retro-sigmoid and displacing the densely adherent sigmoid colon and mesentery medially and superiorally.  To perform the left salpingo-oophorectomy the sigmoid colon was dissected from the adherent sigmoid colon and mesentery with sharp and bovie dissection. In the process of separating the structures, there was unavoidable entry into several mesenteric vessels. These were made hemostatic with either 3-0 vicryl suture or bovie. The left pelvic sidewall just posterior to the left round ligament was entered. WIth careful blunt dissection in the pararectal space the ureter was identified. Using sharp and bovie dissection the left ureter was released from its attachments to the lateral aspect of the left ovarian mass. The left IP was then skeletonized, and clamped  and transected then suture ligated. The ovary was separated from its  peritoneal attachments with the bovie with visualization of the ureter at all times. The utero-ovarian ligaments were skeletonized, clamped and transected and suture ligated. The ovary was separated from residual broad ligament attatchments using the bovie which freed the ovarian specimen which was sent for frozen section.  The right ovary was then removed with its tube by opening the right retroperitoneal space parallel to the IP ligament to enter the retroperitoneal space. The ureter was identified in the retroperitoneum. A window was created in the broad ligament. The IP ligament was then cross clamped, transected, and suture ligated. The broad ligament attachments were take down with the bovie to skeletonized the utero-ovarian ligament. The right utero-ovarian ligament was then clamped, transected and suture ligated.   The sigmoid colon was then inspected. The bowel wall was intact. However, there was a 4cm segment of sigmoid colon in the mid portion of the sigmoid colon that was dusky and clearly demarcated. It corresponded to a segment of sigmoid mesentery which had been somewhat devitalized during dissection from the ovarian mass. It was wrapped in warm moist towels for a 10 minute period. It continued to appear more clearly demarcated in a dusky area. A decision was made that, given this patients' vascular pathology, and likely poor perfusion to this segment of colon, it should be resected.  The segment of bowel to resect was chosen, and the mesentery was inspected with transillumination to ensure that the anastamotic site would contain adequate vascular perfusion. At a  Point 2cm beyond the proximal margin of dusky bowel, a window was created at the junction of the mesentery and bowel wall. The 82m GIA stapler was passed through this window, the bowel compressed, and the stapler deployed and bowel transected. A similar process took  place to choose the distal margin of the anastamotic site and a window was made in the junction of bowel wall and mesentery to pass the GIA stapler. It was compressed, held, then deployed, transecting the bowel. The intervening mesentery was cross clamped, transected and tided, ensuring that adequate residual vessels were feeding the resection ends. The proximal limb of the sigmoid colon was mobilized to ensure it would approximate the distal limb without tension by opening the retroperitoneum parallel to the sigmoid colon.  The two stapled limbs of sigmoid colon were oriented side by side and were fixed at the antimesenteric side to eachother with interrupted 3-0 vicryl suture to maintain stable orientation through the anastamosis. The corners from each limb were sharply transected. The GIA stapler was deployed into each limb, and closed, deployed to create a lumen between the two limbs. The cut ends of bowel were oriented together with Allis clamps, and the 632mTA stapling device was used to compress and staple the open end of the stapled anastamosis with a double suture line. The corners of the anastamosis demonstrated bleeding. These were reinforced and made hemostatic with interrupted 3-0 vicry silk imbricating sutures.  The mesenteric window was closed with interrupted 3-0 vicryl sutures.  The peritoneal cavity was copiously irrigated and hemostasis was confirmed at all surgical sites. It was reinforced in the pelvic raw areas with surgiflow hemostatic agent.  The fascia was reapproximated with #1 prolene in a running mass closure. The subcutaneous layer was then irrigated copiously. Surgeons' gloves were changed.  Exparel long acting local anesthetic was infiltrated into the subcutaneous tissues. The skin was closed with staples. The patient tolerated the procedure well.   She was transferred intubated and sedated to the ICU (planned,  secondary to her cardiac status), in a stable  condition.  Sponge, lap and needle counts were correct x 2.   Donaciano Eva, MD

## 2015-05-19 NOTE — Consult Note (Signed)
PULMONARY / CRITICAL CARE MEDICINE   Name: Diane [name] MRN: [id number] DOB: 1955/10/02    ADMISSION DATE:  05/19/2015 CONSULTATION DATE:  05/19/2015  REFERRING MD :  Denman George  CHIEF COMPLAINT:  Vent management s/p ex-lap with BSO / omentectomy / bilateral segmental sigmoid colectomy.   INITIAL PRESENTATION:  59 y.o. F taken to OR 8/18 for ex-lap with BSO / omentectomy / bilateral segmental sigmoid colectomy.  Remained on the ventilator post operatively; therefore, PCCM called for vent management.   STUDIES:  CXR 8/18 >>>clear, ETT in position  SIGNIFICANT EVENTS: 8/18 - to OR for ex-lap with BSO / omentectomy / bilateral segmental sigmoid colectomy.   HISTORY OF PRESENT ILLNESS:  Pt is encephalopathic; therefore, this HPI is obtained from chart review. Diane [name] is a 59 y.o. F with PMH as outlined below.  She presented to [facility] on [facility]/18 for surgical excision of a large left abdomino-pelvic mass.  She was taken to the OR by Dr. Denman George with GYN onc and had ex-lap with BSO / omentectomy / bilateral segmental sigmoid colectomy. Post operatively, she remained on the ventilator; hence PCCM was consulted for vent management.   PAST MEDICAL HISTORY :   has a past medical history of Coronary artery disease; Hypertension; Hyperlipidemia; Dyslipidemia; Diabetes mellitus; Chronic bronchitis; CVA (cerebral infarction) (1993); Tobacco abuse; COPD (chronic obstructive pulmonary disease); Stroke (1993); Carotid stenosis; Systolic murmur; PONV (postoperative nausea and vomiting); Myocardial infarction; CHF (congestive heart failure); Sleep apnea; Shortness of breath dyspnea; Pneumonia; Goiter; GERD (gastroesophageal reflux disease); Seizures; Headache; Fibromyalgia; and Anemia.  has past surgical history that includes Coronary stent placement (08/11/06); Cardiac catheterization (11/29/05); Cardiac catheterization (08/06/06); Coronary artery bypass graft (12/04/2005); Cholecystectomy; Appendectomy; Cervical  fusion (1990); and Cardiac catheterization (N/A, 05/06/2015). Prior to Admission medications   Medication Sig Start Date End Date Taking? Authorizing Provider  acetaminophen-codeine (TYLENOL #3) 300-30 MG per tablet Take 1 tablet by mouth every 4 (four) hours as needed for moderate pain.   Yes Historical Provider, MD  albuterol (PROVENTIL HFA;VENTOLIN HFA) 108 (90 BASE) MCG/ACT inhaler Inhale 2 puffs into the lungs every 6 (six) hours as needed for wheezing or shortness of breath. 06/23/14  Yes [name], MD  albuterol (PROVENTIL) (2.5 MG/3ML) 0.083% nebulizer solution USE 1 VIAL PER NEBULIZRE EVERY 6 HOURS AS NEEDED *HOSPITALIST DOESNT DO PA 09/21/14  Yes [name], MD  aspirin EC 81 MG tablet Take 1 tablet (81 mg total) by mouth daily. 05/16/15  Yes Peter M Martinique, MD  carvedilol (COREG) 25 MG tablet Take 1 tablet (25 mg total) by mouth 2 (two) times daily with a meal. 05/18/15  Yes Peter M Martinique, MD  clopidogrel (PLAVIX) 75 MG tablet Take 1 tablet (75 mg total) by mouth daily. 03/04/15  Yes Peter M Martinique, MD  DEXILANT 60 MG capsule TAKE 1 CAPSULE (60 MG TOTAL) BY MOUTH DAILY. 02/14/15  Yes [name], MD  diphenhydrAMINE (BENADRYL) 25 mg capsule Take 2 capsules in AM and 1 capsule at night Patient taking differently: Take 25 mg by mouth 2 (two) times daily.  08/19/13  Yes [name], MD  fluticasone (FLONASE) 50 MCG/ACT nasal spray Place 2 sprays into both nostrils daily. 06/18/14  Yes [name], MD  furosemide (LASIX) 40 MG tablet Take 40 mg by mouth daily.  03/14/15  Yes Historical Provider, MD  gabapentin (NEURONTIN) 300 MG capsule TAKE 1 CAPSULE (300 MG TOTAL) BY MOUTH 3 (THREE) TIMES DAILY. Patient taking differently: takes 2 in the pm  03/04/15  Yes Adam Telford Nab, DO  glimepiride (AMARYL) 2 MG tablet Take 2 mg by mouth daily as needed (if sugar is above 140 take additional tab at bedtime).  03/09/15  Yes Historical Provider, MD  guaiFENesin (MUCINEX) 600 MG 12 hr tablet Take 2 tablets (1,200  mg total) by mouth 2 (two) times daily. 08/19/13  Yes [name], MD  hydrochlorothiazide (HYDRODIURIL) 50 MG tablet Take 1 tablet (50 mg total) by mouth daily. 03/04/15  Yes Peter M Martinique, MD  isosorbide mononitrate (IMDUR) 30 MG 24 hr tablet Take 1 tablet (30 mg total) by mouth 2 (two) times daily. 04/27/14  Yes Peter M Martinique, MD  levETIRAcetam (KEPPRA) 750 MG tablet Take 1 tablet (750 mg total) by mouth 2 (two) times daily. 06/23/13  Yes Adam Telford Nab, DO  losartan (COZAAR) 100 MG tablet Take 1 tablet (100 mg total) by mouth daily. 04/27/14  Yes Peter M Martinique, MD  metFORMIN (GLUCOPHAGE-XR) 500 MG 24 hr tablet Take 1,000 mg by mouth 2 (two) times daily. 03/09/15  Yes Historical Provider, MD  nitroGLYCERIN (NITROSTAT) 0.4 MG SL tablet Place 1 tablet (0.4 mg total) under the tongue every 5 (five) minutes as needed. 10/29/13  Yes Peter M Martinique, MD  ondansetron (ZOFRAN) 4 MG tablet Take 1 tablet (4 mg total) by mouth every 8 (eight) hours as needed for nausea or vomiting (take 1-2 tablets as needed every 8 hours for nausea). 04/12/15  Yes Melissa D Cross, NP  phenytoin (DILANTIN) 100 MG ER capsule Take 100 mg by mouth 2 (two) times daily. 06/23/13  Yes Adam Telford Nab, DO  potassium chloride (K-DUR) 10 MEQ tablet Take 1 tablet (10 mEq total) by mouth 2 (two) times daily. 05/02/15  Yes Peter M Martinique, MD  ranolazine (RANEXA) 500 MG 12 hr tablet Take 1 tablet (500 mg total) by mouth 2 (two) times daily. 03/04/15  Yes Peter M Martinique, MD  rosuvastatin (CRESTOR) 10 MG tablet Take 1 tablet (10 mg total) by mouth daily. 04/27/14  Yes Peter M Martinique, MD  VOLTAREN 1 % GEL Apply 2 g topically as needed (pain).  08/12/14  Yes Historical Provider, MD  cyclobenzaprine (FLEXERIL) 10 MG tablet Take 10 mg by mouth at bedtime.  08/12/14   Historical Provider, MD   Allergies  Allergen Reactions  . Ace Inhibitors     REACTION: cough  . Bee Venom   . Eggs Or Egg-Derived Products     Abdominal discomfort-no allergy She can get a  flu shot   . Tetracycline     REACTION: reaction not known    FAMILY HISTORY:  Family History  Problem Relation Age of Onset  . Coronary artery disease    . Heart disease    . Heart disease Mother   . Diabetes Mother   . COPD Mother   . Hyperlipidemia Mother   . Hypertension Mother   . Cancer Father   . Drug abuse Paternal Grandmother   . Stroke Paternal Grandfather   . Cancer Paternal Aunt     lung with mets to brain  . Cancer Paternal Uncle     skin cancer  . Cancer Paternal Uncle     lung/liver to brain  . Cancer Paternal Uncle     cancer of unknown type    SOCIAL HISTORY:  reports that she has been smoking Cigarettes.  She has a 98 pack-year smoking history. She has never used smokeless tobacco. She reports that she does not  drink alcohol or use illicit drugs.  REVIEW OF SYSTEMS:  Unable to obtain as pt is encephalopathic.  SUBJECTIVE:   VITAL SIGNS: Temp:  [98 F (36.7 C)] 98 F (36.7 C) (08/18 1855) Pulse Rate:  [71-85] 72 (08/18 1915) Resp:  [14-18] 18 (08/18 1915) BP: (100-179)/(61-97) 179/97 mmHg (08/18 1900) SpO2:  [97 %-100 %] 100 % (08/18 1915) Arterial Line BP: (135-175)/(68-81) 135/68 mmHg (08/18 1915) FiO2 (%):  [100 %] 100 % (08/18 1855) Weight:  [60.8 kg (134 lb 0.6 oz)-64.411 kg (142 lb)] 60.8 kg (134 lb 0.6 oz) (08/18 1855) HEMODYNAMICS:   VENTILATOR SETTINGS: Vent Mode:  [-] PRVC FiO2 (%):  [100 %] 100 % Set Rate:  [18 bmp] 18 bmp Vt Set:  [500 mL] 500 mL PEEP:  [5 cmH20] 5 cmH20 Plateau Pressure:  [18 cmH20] 18 cmH20 INTAKE / OUTPUT: Intake/Output      08/18 0701 - 08/19 0700   I.V. (mL/kg) 3600 (59.2)   Total Intake(mL/kg) 3600 (59.2)   Urine (mL/kg/hr) 650   Blood 250   Total Output 900   Net +2700         PHYSICAL EXAMINATION: General: acutely ill, c/o abd pain Neuro: alert, non focal HEENT: no jvd, RIJ Cardiovascular: s1s2 nml Lungs: clear,mild secretions Abdomen: soft, distended, tender diffuse Musculoskeletal: no  redness, deformity Skin: no edema, rash   LABS:  CBC  Recent Labs Lab 05/17/15 0920  WBC 5.4  HGB 12.5  HCT 40.1  PLT 250   Coag's No results for input(s): APTT, INR in the last 168 hours. BMET  Recent Labs Lab 05/17/15 0920  NA 132*  K 3.7  CL 94*  CO2 30  BUN 12  CREATININE 0.59  GLUCOSE 117*   Electrolytes  Recent Labs Lab 05/17/15 0920  CALCIUM 9.1   Sepsis Markers No results for input(s): LATICACIDVEN, PROCALCITON, O2SATVEN in the last 168 hours. ABG No results for input(s): PHART, PCO2ART, PO2ART in the last 168 hours. Liver Enzymes  Recent Labs Lab 05/17/15 0920  AST 15  ALT 10*  ALKPHOS 62  BILITOT 0.2*  ALBUMIN 3.9   Cardiac Enzymes No results for input(s): TROPONINI, PROBNP in the last 168 hours. Glucose  Recent Labs Lab 05/19/15 1117 05/19/15 1917  GLUCAP 173* 137*    Imaging No results found.   ASSESSMENT / PLAN:  GYNECOLOGIC A: Large left abdomino-pelvic mass - s/p ex-lap with BSO / omentectomy / bilateral segmental sigmoid colectomy on 05/19/15 (Dr. Denman George).  Dr. Serita Grit suspicion for malignancy is low and CA 125 is normal. P: Post op care per GYN ONC.  PULMONARY OETT 8/18 >>> A: VDRF following ex-lap with BSO / omentectomy / bilateral segmental sigmoid colectomy on 8/18. Hx OSA no longer using CPAP - PSG 04/10/06>> RDI 105  (Sood)  reported COPD though PFT's from September 2015 do not show obstruction (FEV1 93 pre / 93 post, ratio 84 pre / 86 post). PAH (echo from July 2016 with PAP 45). Tobacco use disorder. P:   Full mechanical support, wean as able. VAP bundle. SBT - tolerates PS 5/5 - proceed with extubation Albuterol PRN. Tobacco cessation counseling.  CARDIOVASCULAR R IJ 8/18 >>> A:  Hx HLD, combined CHF (echo from July 2016 with EF 50%, mod diastolic dysfunction), MI. P:  Monitor hemodynamics. Check troponin. Defer timing of restarting daily aspirin, plavix to Dr. Denman George / GYN ONC. Hold outpatient  carvedilol, furosemide, HCTZ, imdur, losartan, ranexa.  RENAL A:   Hypokalemia P:   Correct electrolytes as  indicated. BMP in AM.  GASTROINTESTINAL A:   GERD. Nutrition. P:   SUP: Pantoprazole. NPO.  HEMATOLOGIC A:   Microcytosis. VTE Prophylaxis. P:  SCD's / Lovenox. CBC in AM.  INFECTIOUS A:   Surgical prophylaxis. P:   Ancef, Cefoxitin.  ENDOCRINE A:   DM. P:   SSI. Hold outpatient glimepiride, metformin.  NEUROLOGIC A:   Acute metabolic encephalopathy. Hx Seizures, Headache, Fibromyalgia. P:   Sedation:  Precedex gtt. - dc once extubated RASS goal: 0 to -1. Daily WUA. Continue outpatient keppra, phenytoin. Hold outpatient cyclobenzaprine, gabapentin.   Family updated: none at bedside  Interdisciplinary Family Meeting v Palliative Care Meeting: : NA  Summary - Weaned rapidly post op , extubated, can use CPAP overnight  The patient is critically ill with multiple organ systems failure and requires high complexity decision making for assessment and support, frequent evaluation and titration of therapies, application of advanced monitoring technologies and extensive interpretation of multiple databases. Critical Care Time devoted to patient care services described in this note independent of APP time is 45 minutes.   Kara Mead MD. Shade Flood. [facility] Pager [phone] If no response call [phone]

## 2015-05-19 NOTE — Progress Notes (Signed)
eLink Physician-Brief Progress Note Patient Name: Diane [name] DOB: 03-25-1956 MRN: [id number]   Date of Service  05/19/2015  HPI/Events of Note  Hypotension. SBP = 60's. Precedex IV infusion now held.   eICU Interventions  Will bolus with 0.9 NaCl 1 liter IV over 1 hour now.      Intervention Category Major Interventions: Hypotension - evaluation and management  Lysle Dingwall 05/19/2015, 8:32 PM

## 2015-05-19 NOTE — Addendum Note (Signed)
Addendum  created 05/19/15 2015 by Glory Buff, CRNA   Modules edited: Charges VN

## 2015-05-19 NOTE — Progress Notes (Signed)
RT discussed with MD about CPAP.  Pt currently has NG tube in and MD is in agreement to hold CPAP until tomorrow night.  RT to monitor and assess as needed.

## 2015-05-19 NOTE — Procedures (Signed)
Extubation Procedure Note  Patient Details:   Name: Diane [name] DOB: [birth year] MRN: [id number]   Airway Documentation:  Airway 7 mm (Active)  Secured at (cm) 22 cm 05/19/2015  8:36 PM  Measured From Lips 05/19/2015  8:36 PM  Secured Location Left 05/19/2015  8:36 PM  Secured By [employer] 05/19/2015  8:36 PM  Tube Holder Repositioned Yes 05/19/2015  8:36 PM  Cuff Pressure (cm H2O) 24 cm H2O 05/19/2015  8:36 PM  Site Condition Dry 05/19/2015  6:55 PM    Evaluation  O2 sats: stable throughout Complications: No apparent complications Patient did tolerate procedure well. Bilateral Breath Sounds:  (coarse)   Yes  Izik Bingman, Clinton Sawyer 05/19/2015, 10:45 PM

## 2015-05-19 NOTE — Progress Notes (Signed)
CRITICAL VALUE ALERT  Critical value received:  K= 2.7  Date of notification:  05/19/15  Time of notification:  2220  Critical value read back:Yes.    Nurse who received alert:  Jari Favre RN  MD notified (1st page):  Dr Elsworth Soho  Time of first page:  2245  MD notified (2nd page):  Time of second page:  Responding MD:  Dr Elsworth Soho  Time MD responded:  2245, new order received

## 2015-05-19 NOTE — Progress Notes (Signed)
[place] Progress Note Patient Name: Diane [name] DOB: [birth year] MRN: [id number]   Date of Service  05/19/2015  HPI/Events of Note  ABG on 100%/PRVC 18/TV 500/P 5 = 7.37/43.7/324/25.2. Ppeak = 24 and Pplat = 19.  eICU Interventions  Wean FiO2 as tolerated. Otherwise, continue present airway management.      Intervention Category Major Interventions: Respiratory failure - evaluation and management Intermediate Interventions: Electrolyte abnormality - evaluation and management  Sommer,Steven Eugene 05/19/2015, 10:33 PM

## 2015-05-19 NOTE — Anesthesia Procedure Notes (Signed)
Procedure Name: Intubation Date/Time: 05/19/2015 3:36 PM Performed by: Noralyn Pick D Pre-anesthesia Checklist: Patient identified, Emergency Drugs available, Suction available and Patient being monitored Patient Re-evaluated:Patient Re-evaluated prior to inductionOxygen Delivery Method: Circle System Utilized Preoxygenation: Pre-oxygenation with 100% oxygen Intubation Type: IV induction Ventilation: Mask ventilation without difficulty Grade View: Grade II Tube type: Subglottic suction tube Tube size: 7.0 mm Number of attempts: 1 Airway Equipment and Method: Stylet and Oral airway Placement Confirmation: ETT inserted through vocal cords under direct vision,  positive ETCO2 and breath sounds checked- equal and bilateral Secured at: 21 cm Tube secured with: Tape Dental Injury: Teeth and Oropharynx as per pre-operative assessment

## 2015-05-19 NOTE — Transfer of Care (Signed)
Immediate Anesthesia Transfer of Care Note  Patient: Diane [name]  Procedure(s) Performed: Procedure(s): EXPLORATORY LAPAROTOMY WITH BILATERAL SALPINGO OOPHORECTOMY /OMENTECTOMY/SEGMENTAL SIGMOID COLECTOMY  (Bilateral)  Patient Location: ICU  Anesthesia Type:General  Level of Consciousness: unresponsive  Airway & Oxygen Therapy: Patient remains intubated per anesthesia plan and Patient placed on Ventilator (see vital sign flow sheet for setting)  Post-op Assessment: Report given to RN and Post -op Vital signs reviewed and stable  Post vital signs: Reviewed and stable  Last Vitals:  Filed Vitals:   05/19/15 1104  BP: 100/61  Pulse: 71  Temp: 36.7 C  Resp: 16    Complications: No apparent anesthesia complications

## 2015-05-20 ENCOUNTER — Encounter (HOSPITAL_COMMUNITY): Payer: Self-pay | Admitting: Gynecologic Oncology

## 2015-05-20 ENCOUNTER — Telehealth: Payer: Self-pay | Admitting: *Deleted

## 2015-05-20 DIAGNOSIS — R1900 Intra-abdominal and pelvic swelling, mass and lump, unspecified site: Secondary | ICD-10-CM

## 2015-05-20 LAB — MAGNESIUM: Magnesium: 1.3 mg/dL — ABNORMAL LOW (ref 1.7–2.4)

## 2015-05-20 LAB — GLUCOSE, CAPILLARY
Glucose-Capillary: 183 mg/dL — ABNORMAL HIGH (ref 65–99)
Glucose-Capillary: 177 mg/dL — ABNORMAL HIGH (ref 65–99)
Glucose-Capillary: 137 mg/dL — ABNORMAL HIGH (ref 65–99)
Glucose-Capillary: 153 mg/dL — ABNORMAL HIGH (ref 65–99)
Glucose-Capillary: 149 mg/dL — ABNORMAL HIGH (ref 65–99)
Glucose-Capillary: 156 mg/dL — ABNORMAL HIGH (ref 65–99)

## 2015-05-20 LAB — BASIC METABOLIC PANEL
Anion gap: 5 (ref 5–15)
BUN: 8 mg/dL (ref 6–20)
CO2: 26 mmol/L (ref 22–32)
Calcium: 8 mg/dL — ABNORMAL LOW (ref 8.9–10.3)
Chloride: 101 mmol/L (ref 101–111)
Creatinine, Ser: 0.52 mg/dL (ref 0.44–1.00)
GFR calc Af Amer: >60 mL/min (ref >60)
GFR calc non Af Amer: >60 mL/min (ref >60)
Glucose, Bld: 153 mg/dL — ABNORMAL HIGH (ref 65–99)
Potassium: 4.3 mmol/L (ref 3.5–5.1)
Sodium: 132 mmol/L — ABNORMAL LOW (ref 135–145)

## 2015-05-20 LAB — CBC
HCT: 34.9 % — ABNORMAL LOW (ref 36.0–46.0)
Hemoglobin: 10.7 g/dL — ABNORMAL LOW (ref 12.0–15.0)
MCH: 23 pg — ABNORMAL LOW (ref 26.0–34.0)
MCHC: 30.7 g/dL (ref 30.0–36.0)
MCV: 74.9 fL — ABNORMAL LOW (ref 78.0–100.0)
Platelets: 230 10*3/uL (ref 150–400)
RBC: 4.66 MIL/uL (ref 3.87–5.11)
RDW: 18.9 % — ABNORMAL HIGH (ref 11.5–15.5)
WBC: 13.3 10*3/uL — ABNORMAL HIGH (ref 4.0–10.5)

## 2015-05-20 LAB — PHOSPHORUS: Phosphorus: 3.5 mg/dL (ref 2.5–4.6)

## 2015-05-20 MED ORDER — PHENYTOIN SODIUM EXTENDED 100 MG PO CAPS
100.0000 mg | ORAL_CAPSULE | Freq: Two times a day (BID) | ORAL | Status: DC
  Administered 2015-05-20 – 2015-05-22 (×5): 100 mg via ORAL
  Filled 2015-05-20 (×6): qty 1

## 2015-05-20 MED ORDER — PANTOPRAZOLE SODIUM 40 MG PO TBEC
40.0000 mg | DELAYED_RELEASE_TABLET | Freq: Every day | ORAL | Status: DC
  Administered 2015-05-20 – 2015-05-22 (×3): 40 mg via ORAL
  Filled 2015-05-20 (×3): qty 1

## 2015-05-20 MED ORDER — ALPRAZOLAM 0.5 MG PO TABS
0.5000 mg | ORAL_TABLET | Freq: Three times a day (TID) | ORAL | Status: DC | PRN
  Administered 2015-05-20 – 2015-05-22 (×4): 0.5 mg via ORAL
  Filled 2015-05-20 (×4): qty 1

## 2015-05-20 MED ORDER — CHLORHEXIDINE GLUCONATE 0.12 % MT SOLN
OROMUCOSAL | Status: AC
  Filled 2015-05-20: qty 15

## 2015-05-20 MED ORDER — ASPIRIN 325 MG PO TABS
325.0000 mg | ORAL_TABLET | Freq: Every day | ORAL | Status: DC
  Administered 2015-05-20 – 2015-05-22 (×3): 325 mg via ORAL
  Filled 2015-05-20 (×3): qty 1

## 2015-05-20 MED ORDER — GUAIFENESIN ER 600 MG PO TB12
1200.0000 mg | ORAL_TABLET | Freq: Two times a day (BID) | ORAL | Status: DC
  Administered 2015-05-20 – 2015-05-22 (×5): 1200 mg via ORAL
  Filled 2015-05-20 (×5): qty 2

## 2015-05-20 MED ORDER — NICOTINE 21 MG/24HR TD PT24
21.0000 mg | MEDICATED_PATCH | Freq: Every day | TRANSDERMAL | Status: DC
Start: 2015-05-20 — End: 2015-05-22
  Administered 2015-05-20 – 2015-05-22 (×3): 21 mg via TRANSDERMAL
  Filled 2015-05-20 (×3): qty 1

## 2015-05-20 MED ORDER — CLOPIDOGREL BISULFATE 75 MG PO TABS
75.0000 mg | ORAL_TABLET | Freq: Every day | ORAL | Status: DC
  Administered 2015-05-20 – 2015-05-22 (×3): 75 mg via ORAL
  Filled 2015-05-20 (×3): qty 1

## 2015-05-20 MED ORDER — OXYCODONE-ACETAMINOPHEN 5-325 MG PO TABS
1.0000 | ORAL_TABLET | ORAL | Status: DC | PRN
  Administered 2015-05-20: 1 via ORAL
  Administered 2015-05-20 (×2): 2 via ORAL
  Administered 2015-05-20: 1 via ORAL
  Administered 2015-05-20 – 2015-05-22 (×10): 2 via ORAL
  Filled 2015-05-20 (×3): qty 2
  Filled 2015-05-20: qty 1
  Filled 2015-05-20 (×5): qty 2
  Filled 2015-05-20: qty 1
  Filled 2015-05-20 (×4): qty 2

## 2015-05-20 MED ORDER — LEVETIRACETAM 750 MG PO TABS
750.0000 mg | ORAL_TABLET | Freq: Two times a day (BID) | ORAL | Status: DC
  Administered 2015-05-20 – 2015-05-22 (×5): 750 mg via ORAL
  Filled 2015-05-20 (×7): qty 1

## 2015-05-20 NOTE — Patient Outreach (Signed)
[facility]) Care Management  05/20/2015  Diane [name] Jul 01, 1956 [id number]   Request from Jon Billings, RN to assign a Charity fundraiser, assigned Erenest Rasher, RN.  Thanks, [name]. [place], [place] Assistant Phone: [phone] Fax: [phone]

## 2015-05-20 NOTE — Addendum Note (Signed)
Addended by: Jone Baseman on: 05/20/2015 10:09 AM   Modules accepted: Orders

## 2015-05-20 NOTE — Progress Notes (Signed)
Pt's NG tube has been removed however pt refused CPAP. Pt states she has not worn CPAP in about 4 years and prefers not to wear it while in the hospital. Pt remains on room air. RT will continue to monitor as needed.

## 2015-05-20 NOTE — Progress Notes (Signed)
1 Day Post-Op Procedure(s) (LRB): EXPLORATORY LAPAROTOMY WITH BILATERAL SALPINGO OOPHORECTOMY /OMENTECTOMY/SEGMENTAL SIGMOID COLECTOMY  (Bilateral)  Subjective: Patient reports feeling well. No complaints this morning. Extubated this morning. Hungry. Wants NGT removed.    Objective: Vital signs in last 24 hours: Temp:  [98 F (36.7 C)-99.3 F (37.4 C)] 99.3 F (37.4 C) (08/19 0702) Pulse Rate:  [58-85] 78 (08/19 1200) Resp:  [8-19] 18 (08/19 1200) BP: (54-179)/(37-97) 130/55 mmHg (08/19 1200) SpO2:  [91 %-100 %] 97 % (08/19 1200) Arterial Line BP: (67-175)/(29-84) 131/56 mmHg (08/19 0900) FiO2 (%):  [60 %-100 %] 60 % (08/18 2100) Weight:  [134 lb 0.6 oz (60.8 kg)] 134 lb 0.6 oz (60.8 kg) (08/18 1855)    Intake/Output from previous day: 08/18 0701 - 08/19 0700 In: 5800 [I.V.:4500; IV Piggyback:1300] Out: 1650 [Urine:1400; Blood:250]  Physical Examination: General: alert and cooperative Resp: clear to auscultation bilaterally Cardio: regular rate and rhythm, S1, S2 normal, no murmur, click, rub or gallop GI: soft, non-tender; bowel sounds normal; no masses,  no organomegaly Extremities: no edema.  Incision clean, dry and intact with staples.  Labs: WBC/Hgb/Hct/Plts:  13.3/10.7/34.9/230 (08/19 0445) BUN/Cr/glu/ALT/AST/amyl/lip:  8/0.52/--/--/--/--/-- (08/19 0445)   Assessment:  59 y.o. s/p Procedure(s): EXPLORATORY LAPAROTOMY WITH BILATERAL SALPINGO OOPHORECTOMY /OMENTECTOMY/SEGMENTAL SIGMOID COLECTOMY : stable Pain:  Pain is well-controlled on dilaudid, change to oral medications.  Heme:appropriate postop Hb. No transfusion needed. No sign of ongoing bleeding. Recommend restarting full dose aspirin and plavix for coronary protection.  CV: high cardiac risk given underlying severe coronary disease. No perioperative events (immediate). Can d.c. From ICU and transfer to telemetry for remainder of inpatient stay.   GI:  Tolerating po: No: will d.c. NGT and start on regular  diet.  Patient has a sigmoid side-to-side stapled anastamosis. At risk for anastamotic leak given her vascular disease. No sign of leak or infection process at present.  Will delay discharge until patient tolerating PO and passing flatus.    Endo: Diabetes mellitus Type II, under fair control..  CBG: continue glucosse stabilizer.  Prophylaxis: pharmacologic prophylaxis (with any of the following: enoxaparin (Lovenox) '40mg'$  SQ 2 hours prior to surgery then every day).  Plan: Advance diet Encourage ambulation Advance to PO medication Discontinue IV fluids Dispo:  Discharge plan to include :anticipate home on POD 3-5.   LOS: 1 day    Donaciano Eva 05/20/2015, 1:17 PM

## 2015-05-20 NOTE — Care Management Note (Signed)
Case Management Note  Patient Details  Name: Diane [name] MRN: [id number] Date of Birth: 1956/04/08  Subjective/Objective:               Resp. istress and hypovolemia post op     Action/Plan:Date:  May 20, 2015 U.R. performed for needs and level of care. Will continue to follow for Case Management needs.  Velva Harman, RN, BSN, [place]   [phone]   Expected Discharge Date:                  Expected Discharge Plan:  Home/Self Care  In-House Referral:  NA  Discharge planning Services  CM Consult  Post Acute Care Choice:  NA Choice offered to:  NA  DME Arranged:  N/A DME Agency:  NA  HH Arranged:  NA HH Agency:  NA  Status of Service:  In process, will continue to follow  Medicare Important Message Given:    Date Medicare IM Given:    Medicare IM give by:    Date Additional Medicare IM Given:    Additional Medicare Important Message give by:     If discussed at [place] of Stay Meetings, dates discussed:    Additional Comments:  Leeroy Cha, RN 05/20/2015, 8:54 AM

## 2015-05-20 NOTE — Progress Notes (Signed)
PULMONARY / CRITICAL CARE MEDICINE   Name: Diane [name] MRN: [id number] DOB: [birth year]    ADMISSION DATE:  05/19/2015 CONSULTATION DATE:  05/20/2015  REFERRING MD :  Denman George  CHIEF COMPLAINT:  Vent management s/p ex-lap with BSO / omentectomy / bilateral segmental sigmoid colectomy.   INITIAL PRESENTATION:  59 y.o. F taken to OR 8/18 for ex-lap with BSO / omentectomy / bilateral segmental sigmoid colectomy.  Remained on the ventilator post operatively; therefore, PCCM called for vent management.   STUDIES:  CXR 8/18 >>>clear, ETT in position  SIGNIFICANT EVENTS: 8/18 - to OR for ex-lap with BSO / omentectomy / bilateral segmental sigmoid colectomy. 8/18 extubated  HISTORY OF PRESENT ILLNESS:  Pt is encephalopathic; therefore, this HPI is obtained from chart review. Diane [name] is a 59 y.o. F with PMH as outlined below.  She presented to [facility] on 8/18 for surgical excision of a large left abdomino-pelvic mass.  She was taken to the OR by Dr. Denman George with GYN onc and had ex-lap with BSO / omentectomy / bilateral segmental sigmoid colectomy. Post operatively, she remained on the ventilator; hence PCCM was consulted for vent management.     SUBJECTIVE:   VITAL SIGNS: Temp:  [98 F (36.7 C)-99.3 F (37.4 C)] 99.3 F (37.4 C) (08/19 0702) Pulse Rate:  [58-85] 81 (08/19 0800) Resp:  [8-19] 19 (08/19 0800) BP: (54-179)/(37-97) 120/63 mmHg (08/19 0800) SpO2:  [94 %-100 %] 97 % (08/19 0800) Arterial Line BP: (67-175)/(29-84) 111/46 mmHg (08/19 0800) FiO2 (%):  [60 %-100 %] 60 % (08/18 2100) Weight:  [134 lb 0.6 oz (60.8 kg)-142 lb (64.411 kg)] 134 lb 0.6 oz (60.8 kg) (08/18 1855) HEMODYNAMICS:   VENTILATOR SETTINGS: Vent Mode:  [-] PRVC FiO2 (%):  [60 %-100 %] 60 % Set Rate:  [18 bmp] 18 bmp Vt Set:  [500 mL] 500 mL PEEP:  [5 cmH20] 5 cmH20 Plateau Pressure:  [18 cmH20-19 cmH20] 19 cmH20 INTAKE / OUTPUT: Intake/Output      08/18 0701 - 08/19 0700 08/19 0701 - 08/20 0700    I.V. (mL/kg) 4500 (74) 75 (1.2)   IV Piggyback 1300    Total Intake(mL/kg) 5800 (95.4) 75 (1.2)   Urine (mL/kg/hr) 1400 75 (0.4)   Stool 0    Blood 250    Total Output 1650 75   Net +4150 0        Urine Occurrence  0 x   Stool Occurrence 0 x      PHYSICAL EXAMINATION: General: NAD/off vent Neuro: alert, non focal HEENT: no jvd, RIJ Cardiovascular: s1s2 nml Lungs: clear,mild secretions Abdomen: soft, distended, tender diffuse Musculoskeletal: no redness, deformity Skin: no edema, rash   LABS:  CBC  Recent Labs Lab 05/17/15 0920 05/20/15 0445  WBC 5.4 13.3*  HGB 12.5 10.7*  HCT 40.1 34.9*  PLT 250 230   Coag's No results for input(s): APTT, INR in the last 168 hours. BMET  Recent Labs Lab 05/17/15 0920 05/19/15 2125 05/20/15 0445  NA 132* 133* 132*  K 3.7 2.7* 4.3  CL 94* 101 101  CO2 '30 25 26  '$ BUN '12 10 8  '$ CREATININE 0.59 0.59 0.52  GLUCOSE 117* 187* 153*   Electrolytes  Recent Labs Lab 05/17/15 0920 05/19/15 2125 05/20/15 0445  CALCIUM 9.1 7.7* 8.0*  MG  --   --  1.3*  PHOS  --   --  3.5   Sepsis Markers  Recent Labs Lab 05/19/15 2125  LATICACIDVEN 0.7  ABG  Recent Labs Lab 05/19/15 2038  PHART 7.379  PCO2ART 43.7  PO2ART 324*   Liver Enzymes  Recent Labs Lab 05/17/15 0920 05/19/15 2125  AST 15 38  ALT 10* 19  ALKPHOS 62 43  BILITOT 0.2* 0.6  ALBUMIN 3.9 2.7*   Cardiac Enzymes  Recent Labs Lab 05/19/15 2125  TROPONINI <0.03   Glucose  Recent Labs Lab 05/19/15 1117 05/19/15 1917 05/19/15 2042 05/19/15 2321 05/20/15 0326 05/20/15 0756  GLUCAP 173* 137* 216* 183* 177* 137*    Imaging Dg Chest Port 1 View  05/19/2015   CLINICAL DATA:  Mechanically assisted ventilation.  EXAM: PORTABLE CHEST - 1 VIEW  COMPARISON:  05/02/2015  FINDINGS: Endotracheal tube is approximately 6.5 cm above the carina. Nasogastric tube tip in the proximal stomach. The side hole of the nasogastric tube is near the GE junction. Right  jugular central line tip in the right innominate vein region. Lungs are clear without airspace disease. Negative for a pneumothorax. Heart size is within normal limits. Post median sternotomy.  IMPRESSION: Support apparatuses as described. The nasogastric tube could be advanced further into the stomach.  No focal lung disease.   Electronically Signed   By: Markus Daft M.D.   On: 05/19/2015 20:45     ASSESSMENT / PLAN:  GYNECOLOGIC A: Large left abdomino-pelvic mass - s/p ex-lap with BSO / omentectomy / bilateral segmental sigmoid colectomy on 05/19/15 (Dr. Denman George).  Dr. Serita Grit suspicion for malignancy is low and CA 125 is normal. P: Post op care per GYN ONC.  PULMONARY OETT 8/18 >>>8/18 A: VDRF following ex-lap with BSO / omentectomy / bilateral segmental sigmoid colectomy on 8/18. Hx OSA no longer using CPAP - PSG 04/10/06>> RDI 105  (Sood)  reported COPD though PFT's from September 2015 do not show obstruction (FEV1 93 pre / 93 post, ratio 84 pre / 86 post). PAH (echo from July 2016 with PAP 45). Tobacco use disorder. P:   Pulmonary toilet Albuterol PRN. Tobacco cessation counseling.  CARDIOVASCULAR R IJ 8/18 >>> A:  Hx HLD, combined CHF (echo from July 2016 with EF 11%, mod diastolic dysfunction), MI. P:  Monitor hemodynamics. Check troponin. Defer timing of restarting daily aspirin, plavix to Dr. Denman George / GYN ONC. Hold outpatient carvedilol, furosemide, HCTZ, imdur, losartan, ranexa.  RENAL A:   Hypokalemia P:   Correct electrolytes as indicated. BMP in AM.  GASTROINTESTINAL A:   GERD. Nutrition. P:   SUP: Pantoprazole. NPO.  HEMATOLOGIC A:   Microcytosis. VTE Prophylaxis. P:  SCD's / Lovenox. CBC in AM.  INFECTIOUS A:   Surgical prophylaxis. P:   Ancef, Cefoxitin.  ENDOCRINE A:   DM. P:   SSI. Hold outpatient glimepiride, metformin.  NEUROLOGIC A:   Acute metabolic encephalopathy. Hx Seizures, Headache, Fibromyalgia. P:   RASS goal:  1 Daily WUA. Continue outpatient keppra, phenytoin. Hold outpatient cyclobenzaprine, gabapentin for now.   Family updated: none at bedside  Interdisciplinary Family Meeting v Palliative Care Meeting: : NA  Summary - Weaned rapidly post op , extubated, 8/18, HD stable 8/19. PCCM available as needed.  Richardson Landry Minor ACNP Maryanna Shape PCCM Pager [phone] till 3 pm If no answer page [phone] 05/20/2015, 10:02 AM

## 2015-05-20 NOTE — Consult Note (Signed)
   [facility] Inpatient Consult   05/20/2015  Diane [name] 1955-11-28 [id number]   Patient has been followed by [facility]. Please see chart review tab then notes in EPIC for [facility] Care Management details. Spoke with patient at bedside to make aware that she will be followed by [facility] after hospital discharge. Consent obtained. She endorses she lives alone and thinks she may need "a little help" at home. Discussed that she will receive post hospital transition of care calls and will be evaluated for monthly home visits. [facility] Care Management will not replace or interfere with services that may be provided by home health. Reached out to inpatient RNCM to make aware of bedside visit and to see if patient can have home health at discharge.  Marthenia Rolling, MSN-Ed, RN,BSN [facility] Liaison [phone]

## 2015-05-20 NOTE — Patient Outreach (Signed)
[facility]) Care Management  05/20/2015  Diane [name] January 11, 1956 [id number]   This RNCM reviewed patient's EMR to assess needs for community care coordination after receiving referral. Patient is currently in acute care with disposition pending.  Plan Follow up on disposition on Monday, May 23, 2015

## 2015-05-20 NOTE — Telephone Encounter (Signed)
Notified pt of scheduled appointment on 05/27/2015 with Frankey Poot, NP for staple removal. Pt agreed with time and date

## 2015-05-20 NOTE — Progress Notes (Signed)
Key Points: Use following P&T approved IV to PO non-antibiotic change policy.  Description contains the criteria that are approved Note: Policy Excludes:  Esophagectomy patientsPHARMACIST - PHYSICIAN COMMUNICATION CONCERNING: IV to Oral Route Change Policy  RECOMMENDATION: This patient is receiving Keppra, Dilantin, and Protonix by the intravenous route.  Based on criteria approved by the Pharmacy and Therapeutics Committee, the intravenous medication(s) is/are being converted to the equivalent oral dose form(s).   DESCRIPTION: These criteria include:  The patient is eating (either orally or via tube) and/or has been taking other orally administered medications for a least 24 hours  The patient has no evidence of active gastrointestinal bleeding or impaired GI absorption (gastrectomy, short bowel, patient on TNA or NPO).  If you have questions about this conversion, please contact the Pharmacy Department  '[]'$   [phone] )  [facility] '[]'$   [phone] )  [facility]  '[]'$   [phone] )  [facility] '[x]'$   [phone] )  [place], [place], [facility] 05/20/2015 9:49 AM

## 2015-05-21 LAB — GLUCOSE, CAPILLARY
Glucose-Capillary: 136 mg/dL — ABNORMAL HIGH (ref 65–99)
Glucose-Capillary: 171 mg/dL — ABNORMAL HIGH (ref 65–99)
Glucose-Capillary: 130 mg/dL — ABNORMAL HIGH (ref 65–99)
Glucose-Capillary: 190 mg/dL — ABNORMAL HIGH (ref 65–99)
Glucose-Capillary: 131 mg/dL — ABNORMAL HIGH (ref 65–99)
Glucose-Capillary: 161 mg/dL — ABNORMAL HIGH (ref 65–99)

## 2015-05-21 MED ORDER — FUROSEMIDE 40 MG PO TABS
40.0000 mg | ORAL_TABLET | Freq: Every day | ORAL | Status: DC
  Administered 2015-05-21 – 2015-05-22 (×2): 40 mg via ORAL
  Filled 2015-05-21 (×2): qty 1

## 2015-05-21 MED ORDER — ROSUVASTATIN CALCIUM 10 MG PO TABS
10.0000 mg | ORAL_TABLET | Freq: Every day | ORAL | Status: DC
  Administered 2015-05-21: 10 mg via ORAL
  Filled 2015-05-21: qty 1

## 2015-05-21 MED ORDER — INSULIN ASPART 100 UNIT/ML ~~LOC~~ SOLN
0.0000 [IU] | Freq: Three times a day (TID) | SUBCUTANEOUS | Status: DC
  Administered 2015-05-21: 2 [IU] via SUBCUTANEOUS
  Administered 2015-05-21: 3 [IU] via SUBCUTANEOUS
  Administered 2015-05-22: 2 [IU] via SUBCUTANEOUS

## 2015-05-21 MED ORDER — CARVEDILOL 25 MG PO TABS
25.0000 mg | ORAL_TABLET | Freq: Two times a day (BID) | ORAL | Status: DC
  Administered 2015-05-21 – 2015-05-22 (×2): 25 mg via ORAL
  Filled 2015-05-21 (×2): qty 1

## 2015-05-21 MED ORDER — HYDROCHLOROTHIAZIDE 25 MG PO TABS
50.0000 mg | ORAL_TABLET | Freq: Every day | ORAL | Status: DC
  Administered 2015-05-21 – 2015-05-22 (×2): 50 mg via ORAL
  Filled 2015-05-21 (×2): qty 2

## 2015-05-21 MED ORDER — GLUCERNA SHAKE PO LIQD
237.0000 mL | Freq: Three times a day (TID) | ORAL | Status: DC
  Administered 2015-05-21 (×2): 237 mL via ORAL
  Filled 2015-05-21 (×6): qty 237

## 2015-05-21 MED ORDER — NITROGLYCERIN 0.4 MG SL SUBL: 0.4000 mg | SUBLINGUAL_TABLET | SUBLINGUAL | Status: DC | PRN

## 2015-05-21 MED ORDER — GLIMEPIRIDE 2 MG PO TABS
2.0000 mg | ORAL_TABLET | Freq: Every day | ORAL | Status: DC | PRN
  Filled 2015-05-21: qty 1

## 2015-05-21 MED ORDER — RANOLAZINE ER 500 MG PO TB12
500.0000 mg | ORAL_TABLET | Freq: Two times a day (BID) | ORAL | Status: DC
  Administered 2015-05-21 – 2015-05-22 (×3): 500 mg via ORAL
  Filled 2015-05-21 (×4): qty 1

## 2015-05-21 MED ORDER — GABAPENTIN 300 MG PO CAPS
300.0000 mg | ORAL_CAPSULE | Freq: Three times a day (TID) | ORAL | Status: DC
  Administered 2015-05-21 – 2015-05-22 (×4): 300 mg via ORAL
  Filled 2015-05-21 (×4): qty 1

## 2015-05-21 MED ORDER — ISOSORBIDE MONONITRATE ER 30 MG PO TB24
30.0000 mg | ORAL_TABLET | Freq: Two times a day (BID) | ORAL | Status: DC
  Administered 2015-05-21 – 2015-05-22 (×3): 30 mg via ORAL
  Filled 2015-05-21 (×3): qty 1

## 2015-05-21 MED ORDER — METFORMIN HCL ER 500 MG PO TB24
1000.0000 mg | ORAL_TABLET | Freq: Two times a day (BID) | ORAL | Status: DC
  Administered 2015-05-21 – 2015-05-22 (×3): 1000 mg via ORAL
  Filled 2015-05-21 (×5): qty 2

## 2015-05-21 MED ORDER — LOSARTAN POTASSIUM 50 MG PO TABS
100.0000 mg | ORAL_TABLET | Freq: Every day | ORAL | Status: DC
  Administered 2015-05-21 – 2015-05-22 (×2): 100 mg via ORAL
  Filled 2015-05-21 (×2): qty 2

## 2015-05-21 NOTE — Progress Notes (Signed)
Pharmacy brief note:  FYI Drug:Drug interaction Pt on Phenytoin and Ranexa PTA.  Concurrent use of Phenytoin and Ranexa may result in decreased Ranexa plasma concentrations and is contraindicated per manufacturer.  Thanks Dorrene German 05/21/2015 11:25 AM

## 2015-05-21 NOTE — Progress Notes (Signed)
Patient ID: Diane [name], female   DOB: [birth year], 59 y.o.   MRN: [id number] 2 Days Post-Op Procedure(s) (LRB): EXPLORATORY LAPAROTOMY WITH BILATERAL SALPINGO OOPHORECTOMY /OMENTECTOMY/SEGMENTAL SIGMOID COLECTOMY  (Bilateral)  Subjective: No complaints.  No flatus or BM   Objective: Vital signs in last 24 hours: Temp:  [98 F (36.7 C)-99.7 F (37.6 C)] 98 F (36.7 C) (08/20 1030) Pulse Rate:  [78-99] 83 (08/20 1030) Resp:  [13-20] 18 (08/20 1030) BP: (114-142)/(55-67) 131/67 mmHg (08/20 1030) SpO2:  [90 %-99 %] 90 % (08/20 1030) Last BM Date: 05/16/15  Intake/Output from previous day: 08/19 0701 - 08/20 0700 In: 495 [I.V.:495] Out: 600 [Urine:350; Emesis/NG output:250]  Physical Examination: General: alert and cooperative Resp: clear to auscultation bilaterally Cardio: regular rate and rhythm, S1, S2 normal, no murmur, click, rub or gallop GI: soft, non-tender; bowel sounds normal; no masses,  no organomegaly Extremities: no edema.  Incision clean, dry and intact with staples.  Labs:       Assessment:  59 y.o. s/p Procedure(s): EXPLORATORY LAPAROTOMY WITH BILATERAL SALPINGO OOPHORECTOMY /OMENTECTOMY/SEGMENTAL SIGMOID COLECTOMY : stable Pain:  Pain is well-controlled on  oral medications.   CV: high cardiac risk given underlying severe coronary disease. Telemetry monitoring uneventful to date  GI:  Tolerating po: tolerating regular diet    Endo: Diabetes mellitus Type II, under fair control.[name]    Prophylaxis: pharmacologic prophylaxis (with any of the following: enoxaparin (Lovenox) '40mg'$  SQ 2 hours prior to surgery then every day).  Plan: Change CBG monitoring-->qac/hs Resume cardiac medications Encourage ambulation   Dispo:  Discharge plan to include :anticipate home on POD 3-5.   LOS: 2 days    [name],Diane Proby A 05/21/2015, 10:48 AM

## 2015-05-22 LAB — CBC WITH DIFFERENTIAL/PLATELET
Basophils Absolute: 0 10*3/uL (ref 0.0–0.1)
Basophils Relative: 0 % (ref 0–1)
Eosinophils Absolute: 0.2 10*3/uL (ref 0.0–0.7)
Eosinophils Relative: 2 % (ref 0–5)
HCT: 30.3 % — ABNORMAL LOW (ref 36.0–46.0)
Hemoglobin: 9.6 g/dL — ABNORMAL LOW (ref 12.0–15.0)
Lymphocytes Relative: 12 % (ref 12–46)
Lymphs Abs: 1.1 10*3/uL (ref 0.7–4.0)
MCH: 24.1 pg — ABNORMAL LOW (ref 26.0–34.0)
MCHC: 31.7 g/dL (ref 30.0–36.0)
MCV: 76.1 fL — ABNORMAL LOW (ref 78.0–100.0)
Monocytes Absolute: 0.7 10*3/uL (ref 0.1–1.0)
Monocytes Relative: 8 % (ref 3–12)
Neutro Abs: 7.3 10*3/uL (ref 1.7–7.7)
Neutrophils Relative %: 78 % — ABNORMAL HIGH (ref 43–77)
Platelets: 234 10*3/uL (ref 150–400)
RBC: 3.98 MIL/uL (ref 3.87–5.11)
RDW: 19.4 % — ABNORMAL HIGH (ref 11.5–15.5)
WBC: 9.4 10*3/uL (ref 4.0–10.5)

## 2015-05-22 LAB — BASIC METABOLIC PANEL
Anion gap: 10 (ref 5–15)
BUN: 14 mg/dL (ref 6–20)
CO2: 27 mmol/L (ref 22–32)
Calcium: 8.6 mg/dL — ABNORMAL LOW (ref 8.9–10.3)
Chloride: 93 mmol/L — ABNORMAL LOW (ref 101–111)
Creatinine, Ser: 0.6 mg/dL (ref 0.44–1.00)
GFR calc Af Amer: >60 mL/min (ref >60)
GFR calc non Af Amer: >60 mL/min (ref >60)
Glucose, Bld: 152 mg/dL — ABNORMAL HIGH (ref 65–99)
Potassium: 3.4 mmol/L — ABNORMAL LOW (ref 3.5–5.1)
Sodium: 130 mmol/L — ABNORMAL LOW (ref 135–145)

## 2015-05-22 LAB — GLUCOSE, CAPILLARY
Glucose-Capillary: 137 mg/dL — ABNORMAL HIGH (ref 65–99)
Glucose-Capillary: 118 mg/dL — ABNORMAL HIGH (ref 65–99)

## 2015-05-22 MED ORDER — OXYCODONE-ACETAMINOPHEN 5-325 MG PO TABS: 1.0000 | ORAL_TABLET | Freq: Four times a day (QID) | ORAL | Status: DC | PRN

## 2015-05-22 NOTE — Discharge Summary (Signed)
Physician Discharge Summary  Patient ID: Diane [name] MRN: [id number] DOB/AGE: 61/26/1957 61 y.o.  Admit date: 05/19/2015 Discharge date: 05/22/2015  Admission Diagnoses: Active Problems:   Pelvic mass in female   Pelvic mass  Discharge Diagnoses:  Active Problems:   Pelvic mass in female   Pelvic mass   Discharged Condition: good  Hospital Course:On 05/19/2015, the patient underwent the following: Procedure(s): EXPLORATORY LAPAROTOMY WITH BILATERAL SALPINGO OOPHORECTOMY /OMENTECTOMY/SEGMENTAL SIGMOID COLECTOMY .   The postoperative course was uneventful.  She remained extubated in the ICU for 12 hours.  Subsequently she was transferred to a telemetry bed.  CBGS remained in range.  She was discharged to home on postoperative day 3 tolerating a regular diet.  Consults: pulmonary/intensive care  Significant Diagnostic Studies: none  Treatments: surgery: see above  Discharge Exam: Blood pressure 114/43, pulse 76, temperature 98 F (36.7 C), temperature source Oral, resp. rate 20, height '4\' 11"'$  (1.499 m), weight 134 lb 0.6 oz (60.8 kg), last menstrual period 09/02/2003, SpO2 98 %. General appearance: alert Resp: clear to auscultation bilaterally Cardio: regular rate and rhythm, S1, S2 normal, no murmur, click, rub or gallop GI: soft, non-tender; bowel sounds normal; no masses,  no organomegaly Extremities: extremities normal, atraumatic, no cyanosis or edema Incision/Wound: C/D/I  Disposition: 01-Home or Self Care     Medication List    STOP taking these medications        acetaminophen-codeine 300-30 MG per tablet  Commonly known as:  TYLENOL #3      TAKE these medications        albuterol 108 (90 BASE) MCG/ACT inhaler  Commonly known as:  PROVENTIL HFA;VENTOLIN HFA  Inhale 2 puffs into the lungs every 6 (six) hours as needed for wheezing or shortness of breath.     albuterol (2.5 MG/3ML) 0.083% nebulizer solution  Commonly known as:  PROVENTIL  USE 1 VIAL PER  NEBULIZRE EVERY 6 HOURS AS NEEDED *HOSPITALIST DOESNT DO PA     aspirin EC 81 MG tablet  Take 1 tablet (81 mg total) by mouth daily.     carvedilol 25 MG tablet  Commonly known as:  COREG  Take 1 tablet (25 mg total) by mouth 2 (two) times daily with a meal.     clopidogrel 75 MG tablet  Commonly known as:  PLAVIX  Take 1 tablet (75 mg total) by mouth daily.     cyclobenzaprine 10 MG tablet  Commonly known as:  FLEXERIL  Take 10 mg by mouth at bedtime.     DEXILANT 60 MG capsule  Generic drug:  dexlansoprazole  TAKE 1 CAPSULE (60 MG TOTAL) BY MOUTH DAILY.     diphenhydrAMINE 25 mg capsule  Commonly known as:  BENADRYL  Take 2 capsules in AM and 1 capsule at night     fluticasone 50 MCG/ACT nasal spray  Commonly known as:  FLONASE  Place 2 sprays into both nostrils daily.     furosemide 40 MG tablet  Commonly known as:  LASIX  Take 40 mg by mouth daily.     gabapentin 300 MG capsule  Commonly known as:  NEURONTIN  TAKE 1 CAPSULE (300 MG TOTAL) BY MOUTH 3 (THREE) TIMES DAILY.     glimepiride 2 MG tablet  Commonly known as:  AMARYL  Take 2 mg by mouth daily as needed (if sugar is above 140 take additional tab at bedtime).     guaiFENesin 600 MG 12 hr tablet  Commonly known as:  MUCINEX  Take 2 tablets (1,200 mg total) by mouth 2 (two) times daily.     hydrochlorothiazide 50 MG tablet  Commonly known as:  HYDRODIURIL  Take 1 tablet (50 mg total) by mouth daily.     isosorbide mononitrate 30 MG 24 hr tablet  Commonly known as:  IMDUR  Take 1 tablet (30 mg total) by mouth 2 (two) times daily.     levETIRAcetam 750 MG tablet  Commonly known as:  KEPPRA  Take 1 tablet (750 mg total) by mouth 2 (two) times daily.     losartan 100 MG tablet  Commonly known as:  COZAAR  Take 1 tablet (100 mg total) by mouth daily.     metFORMIN 500 MG 24 hr tablet  Commonly known as:  GLUCOPHAGE-XR  Take 1,000 mg by mouth 2 (two) times daily.     nitroGLYCERIN 0.4 MG SL tablet   Commonly known as:  NITROSTAT  Place 1 tablet (0.4 mg total) under the tongue every 5 (five) minutes as needed.     ondansetron 4 MG tablet  Commonly known as:  ZOFRAN  Take 1 tablet (4 mg total) by mouth every 8 (eight) hours as needed for nausea or vomiting (take 1-2 tablets as needed every 8 hours for nausea).     oxyCODONE-acetaminophen 5-325 MG per tablet  Commonly known as:  ROXICET  Take 1-2 tablets by mouth every 6 (six) hours as needed for severe pain.     phenytoin 100 MG ER capsule  Commonly known as:  DILANTIN  Take 100 mg by mouth 2 (two) times daily.     potassium chloride 10 MEQ tablet  Commonly known as:  K-DUR  Take 1 tablet (10 mEq total) by mouth 2 (two) times daily.     ranolazine 500 MG 12 hr tablet  Commonly known as:  RANEXA  Take 1 tablet (500 mg total) by mouth 2 (two) times daily.     rosuvastatin 10 MG tablet  Commonly known as:  CRESTOR  Take 1 tablet (10 mg total) by mouth daily.     VOLTAREN 1 % Gel  Generic drug:  diclofenac sodium  Apply 2 g topically as needed (pain).           Follow-up Information    Follow up with Donaciano Eva, MD On 05/27/2015.   Specialty:  Obstetrics and Gynecology   Why:  For suture removal   Contact information:   [place] [address] [phone]       Signed: Agnes Lawrence 05/22/2015, 10:59 AM

## 2015-05-22 NOTE — Discharge Instructions (Signed)
Return to work: 2 weeks  Activity: 1. Be up and out of the bed during the day.  Take a nap if needed.  You may walk up steps but be careful and use the hand rail.  Stair climbing will tire you more than you think, you may need to stop part way and rest.   2. No lifting or straining for 6 weeks.  3. No driving for 1-2 weeks.  Do Not drive if you are taking narcotic pain medicine.  4. Shower daily.  Use soap and water on your incision and pat dry; don't rub.   5. No sexual activity and nothing in the vagina for 4 weeks.  Diet: 1. Low sodium Heart Healthy Diet is recommended.  2. It is safe to use a laxative if you have difficulty moving your bowels.   Wound Care: 1. Keep clean and dry.  Shower daily.  Reasons to call the Doctor:   Fever - Oral temperature greater than 100.4 degrees Fahrenheit  Foul-smelling vaginal discharge  Difficulty urinating  Nausea and vomiting  Increased pain at the site of the incision that is unrelieved with pain medicine.  Difficulty breathing with or without chest pain  New calf pain especially if only on one side  Sudden, continuing increased vaginal bleeding with or without clots.   Follow-up: 1. See Everitt Amber in 4 weeks.  Contacts: For questions or concerns you should contact:  Dr. Everitt Amber at [phone]  or at [place]  Bilateral Salpingo-Oophorectomy, Care After Refer to this sheet in the next few weeks. These instructions provide you with information on caring for yourself after your procedure. Your health care provider may also give you more specific instructions. Your treatment has been planned according to current medical practices, but problems sometimes occur. Call your health care provider if you have any problems or questions after your procedure. WHAT TO EXPECT AFTER THE PROCEDURE After your procedure, it is typical to have the following:   Abdominal pain that can be controlled with medicine.  Vaginal  spotting.  Constipation.  Menopausal symptoms such as hot flashes, vaginal dryness, and mood swings. HOME CARE INSTRUCTIONS   Get plenty of rest and sleep.  Only take over-the-counter or prescription medicines as directed by your health care provider. Do not take aspirin. It can cause bleeding.  Keep incision areas clean and dry. Remove or change bandages (dressings) only as directed by your health care provider.  Take showers instead of baths for a few weeks as directed by your health care provider.  Limit exercise and activities as directed by your health care provider. Do not lift anything heavier than 5 pounds (2.3 kg) until your health care provider approves.  Do not drive until your health care provider approves.  Follow your health care provider's advice regarding diet. You may be able to resume your usual diet right away.  Drink enough fluids to keep your urine clear or pale yellow.  Do not douche, use tampons, or have sexual intercourse for 6 weeks after the procedure.  Do not drink alcohol until your health care provider says it is okay.  Take your temperature twice a day and write it down.  If you become constipated, you may:  Ask your health care provider about taking a mild laxative.  Add more fruit and bran to your diet.  Drink more fluids.  Follow up with your health care provider as directed. SEEK MEDICAL CARE IF:   You have swelling, redness, or  increasing pain in the incision area.  You see pus coming from the incision area.  You notice a bad smell coming from the wound or dressing.  You have pain, redness, or swelling where the IV access tube was placed.  Your incision is breaking open (the edges are not staying together).  You feel dizzy or feel like fainting.  You develop pain or bleeding when you urinate.  You develop diarrhea.  You develop nausea and vomiting.  You develop abnormal vaginal discharge.  You develop a rash.  You have  pain that is not controlled with medicine. SEEK IMMEDIATE MEDICAL CARE IF:   You develop a fever.  You develop abdominal pain.  You have chest pain.  You develop shortness of breath.  You pass out.  You develop pain, swelling, or redness in your leg.  You develop heavy vaginal bleeding with or without blood clots. Document Released: 09/17/2005 Document Revised: 05/20/2013 Document Reviewed: 03/11/2013 [facility] Patient Information 2015 [place], [place]. This information is not intended to replace advice given to you by your health care provider. Make sure you discuss any questions you have with your health care provider.

## 2015-05-23 ENCOUNTER — Other Ambulatory Visit: Payer: Self-pay

## 2015-05-23 LAB — GLUCOSE, CAPILLARY: Glucose-Capillary: 114 mg/dL — ABNORMAL HIGH (ref 65–99)

## 2015-05-23 NOTE — Patient Outreach (Signed)
Chart reviewed for community care coordination  Plan: Transition of care call later today.

## 2015-05-23 NOTE — Patient Outreach (Signed)
This RN CM contacted patient via telephone for initial telephone conversation Patient identified herself by providing [place] identifiers, confirmed her discharge from acute care setting  Patient responded appropriately to assessment questions for community care coordination.

## 2015-05-24 ENCOUNTER — Encounter: Payer: Self-pay | Admitting: Cardiology

## 2015-05-24 ENCOUNTER — Ambulatory Visit (INDEPENDENT_AMBULATORY_CARE_PROVIDER_SITE_OTHER): Payer: Medicare Other | Admitting: Cardiology

## 2015-05-24 ENCOUNTER — Encounter: Payer: Self-pay | Admitting: *Deleted

## 2015-05-24 VITALS — BP 142/62 | HR 88 | Ht <= 58 in | Wt 129.9 lb

## 2015-05-24 DIAGNOSIS — I739 Peripheral vascular disease, unspecified: Principal | ICD-10-CM

## 2015-05-24 DIAGNOSIS — I779 Disorder of arteries and arterioles, unspecified: Secondary | ICD-10-CM

## 2015-05-24 DIAGNOSIS — I5042 Chronic combined systolic (congestive) and diastolic (congestive) heart failure: Secondary | ICD-10-CM | POA: Diagnosis not present

## 2015-05-24 DIAGNOSIS — I25708 Atherosclerosis of coronary artery bypass graft(s), unspecified, with other forms of angina pectoris: Secondary | ICD-10-CM | POA: Diagnosis not present

## 2015-05-24 MED ORDER — FUROSEMIDE 40 MG PO TABS: 40.0000 mg | ORAL_TABLET | Freq: Every day | ORAL | Status: DC

## 2015-05-24 NOTE — Patient Instructions (Signed)
Continue your current therapy   I will see you in 3 months. 

## 2015-05-24 NOTE — Progress Notes (Signed)
Diane [name] Spare Date of Birth: 1956-03-20 Medical Record #[id number]  History of Present Illness: Diane [name] is seen for post hospital follow up.  She has a history of coronary disease and is status post CABG in March of 2007 by Dr. [name]. She presented later that year with recurrent angina. Repeat cardiac cath showed patent LIMA to the LAD but all other grafts occluded including SVG to OM, SVG to AC/PL, and radial graft to ramus intermediate. She then had complex stenting of the mid LCx and first OM with Taxus stents. The native RCA was occluded with collaterals.  Recently she was evaluated for abdominal surgery. Cardiac cath as noted above but now stents in LCx and OM occluded. No suitable targets for PCI or surgery. Severe LV dysfunction with moderate pulmonary HTN and normal LV filling pressures. She has multiple cardiac risk factors including diabetes, dyslipidemia, hypertension, and tobacco abuse. She also has moderate bilateral carotid arterial disease. She continues to smoke.   She was discharged from the hospital 2 days ago following surgery with Dr. Denman George. She had a breast biopsy that was benign. She had removal of a large pelvic mass with BSO and sigmoid colectomy. Initial path c/w ovarian cystadenoma. On follow up she is doing well. No post op complications. Denies any angina or SOB. Appetite OK. Passing gas but no BM yet. She has lost 11 lbs.  Current Outpatient Prescriptions on File Prior to Visit  Medication Sig Dispense Refill  . albuterol (PROVENTIL HFA;VENTOLIN HFA) 108 (90 BASE) MCG/ACT inhaler Inhale 2 puffs into the lungs every 6 (six) hours as needed for wheezing or shortness of breath. 1 Inhaler 5  . albuterol (PROVENTIL) (2.5 MG/3ML) 0.083% nebulizer solution USE 1 VIAL PER NEBULIZRE EVERY 6 HOURS AS NEEDED *HOSPITALIST DOESNT DO PA 75 mL 0  . aspirin EC 81 MG tablet Take 1 tablet (81 mg total) by mouth daily. 30 tablet 6  . carvedilol (COREG) 25 MG tablet Take 1 tablet (25 mg  total) by mouth 2 (two) times daily with a meal. 180 tablet 3  . clopidogrel (PLAVIX) 75 MG tablet Take 1 tablet (75 mg total) by mouth daily. 90 tablet 0  . cyclobenzaprine (FLEXERIL) 10 MG tablet Take 10 mg by mouth at bedtime.     [name] DEXILANT 60 MG capsule TAKE 1 CAPSULE (60 MG TOTAL) BY MOUTH DAILY. 30 capsule 5  . diphenhydrAMINE (BENADRYL) 25 mg capsule Take 2 capsules in AM and 1 capsule at night (Patient taking differently: Take 25 mg by mouth 2 (two) times daily. ) 270 capsule 3  . fluticasone (FLONASE) 50 MCG/ACT nasal spray Place 2 sprays into both nostrils daily. 16 g 2  . gabapentin (NEURONTIN) 300 MG capsule TAKE 1 CAPSULE (300 MG TOTAL) BY MOUTH 3 (THREE) TIMES DAILY. (Patient taking differently: takes 2 in the pm) 90 capsule 2  . glimepiride (AMARYL) 2 MG tablet Take 2 mg by mouth daily as needed (if sugar is above 140 take additional tab at bedtime).     [name] guaiFENesin (MUCINEX) 600 MG 12 hr tablet Take 2 tablets (1,200 mg total) by mouth 2 (two) times daily. 360 tablet 3  . hydrochlorothiazide (HYDRODIURIL) 50 MG tablet Take 1 tablet (50 mg total) by mouth daily. 30 tablet 2  . isosorbide mononitrate (IMDUR) 30 MG 24 hr tablet Take 1 tablet (30 mg total) by mouth 2 (two) times daily. 180 tablet 3  . levETIRAcetam (KEPPRA) 750 MG tablet Take 1 tablet (750 mg total) by mouth  2 (two) times daily. 60 tablet 11  . losartan (COZAAR) 100 MG tablet Take 1 tablet (100 mg total) by mouth daily. 90 tablet 3  . metFORMIN (GLUCOPHAGE-XR) 500 MG 24 hr tablet Take 1,000 mg by mouth 2 (two) times daily.  6  . nitroGLYCERIN (NITROSTAT) 0.4 MG SL tablet Place 1 tablet (0.4 mg total) under the tongue every 5 (five) minutes as needed. 25 tablet 11  . ondansetron (ZOFRAN) 4 MG tablet Take 1 tablet (4 mg total) by mouth every 8 (eight) hours as needed for nausea or vomiting (take 1-2 tablets as needed every 8 hours for nausea). 20 tablet 0  . oxyCODONE-acetaminophen (ROXICET) 5-325 MG per tablet Take 1-2  tablets by mouth every 6 (six) hours as needed for severe pain. 30 tablet 0  . phenytoin (DILANTIN) 100 MG ER capsule Take 100 mg by mouth 2 (two) times daily.    . potassium chloride (K-DUR) 10 MEQ tablet Take 1 tablet (10 mEq total) by mouth 2 (two) times daily. 180 tablet 1  . ranolazine (RANEXA) 500 MG 12 hr tablet Take 1 tablet (500 mg total) by mouth 2 (two) times daily. 60 tablet 2  . rosuvastatin (CRESTOR) 10 MG tablet Take 1 tablet (10 mg total) by mouth daily. 90 tablet 3  . VOLTAREN 1 % GEL Apply 2 g topically as needed (pain).      No current facility-administered medications on file prior to visit.    Allergies  Allergen Reactions  . Ace Inhibitors     REACTION: cough  . Bee Venom   . Tetracycline     REACTION: reaction not known    Past Medical History  Diagnosis Date  . Coronary artery disease     post CABG in 3/07   . Hypertension   . Hyperlipidemia   . Dyslipidemia   . Diabetes mellitus     type 2  . Chronic bronchitis   . CVA (cerebral infarction) 1993  . Tobacco abuse   . COPD (chronic obstructive pulmonary disease)   . Stroke 1993  . Carotid stenosis   . Systolic murmur     known mild AS and MR  . PONV (postoperative nausea and vomiting)     only once was patient sick in the 1980s   . Myocardial infarction   . CHF (congestive heart failure)   . Sleep apnea     used to wear a cpap- not used in 3 years   . Shortness of breath dyspnea     with exertion or when fluid builds up   . Pneumonia     hx of   . Goiter   . GERD (gastroesophageal reflux disease)   . Seizures     last seizure- 03/2013   . Headache     hx of   . Fibromyalgia   . Anemia     Past Surgical History  Procedure Laterality Date  . Coronary stent placement  08/11/06    PCI of her ciurcumflex/OM vessel  . Cardiac catheterization  11/29/05    EF of 55%  . Cardiac catheterization  08/06/06    EF of 45-50%  . Coronary artery bypass graft  12/04/2005    x5 -- left internal mammary  artery to the LAD, left radial artery to the ramus intermedius, saphenous vein graft to the obtuse marginal 1, sequential saphenous vein grat to the acute marginal and posterior descending, endoscopic vein harvesting from the left thigh with open vein harvest from right  leg  . Cholecystectomy    . Appendectomy    . Cervical fusion  1990  . Cardiac catheterization N/A 05/06/2015    Procedure: Right/Left Heart Cath and Coronary/Graft Angiography;  Surgeon: Maleeka Sabatino M Martinique, MD;  Location: [facility];  Service: Cardiovascular;  Laterality: N/A;  . Laparotomy Bilateral 05/19/2015    Procedure: EXPLORATORY LAPAROTOMY WITH BILATERAL SALPINGO OOPHORECTOMY /OMENTECTOMY/SEGMENTAL SIGMOID COLECTOMY ;  Surgeon: Everitt Amber, MD;  Location: [facility];  Service: Gynecology;  Laterality: Bilateral;    History  Smoking status  . Current Every Day Smoker -- 2.00 packs/day for 49 years  . Types: Cigarettes  Smokeless tobacco  . Never Used    History  Alcohol Use No    Family History  Problem Relation Age of Onset  . Coronary artery disease    . Heart disease    . Heart disease Mother   . Diabetes Mother   . COPD Mother   . Hyperlipidemia Mother   . Hypertension Mother   . Cancer Father   . Drug abuse Paternal Grandmother   . Stroke Paternal Grandfather   . Cancer Paternal Aunt     lung with mets to brain  . Cancer Paternal Uncle     skin cancer  . Cancer Paternal Uncle     lung/liver to brain  . Cancer Paternal Uncle     cancer of unknown type    Review of Systems: As noted in history of present illness.  All other systems were reviewed and are negative.  Physical Exam: BP 142/62 mmHg  Pulse 88  Ht '4\' 10"'$  (1.473 m)  Wt 58.922 kg (129 lb 14.4 oz)  BMI 27.16 kg/m2  LMP 09/02/2003 She is a chronically ill-appearing white female in no acute distress. She is normocephalic, atraumatic. Pupils are equal round and reactive. Sclera clear. Oropharynx is clear. Neck reveals bilateral carotid  bruits. There is no JVD. Lungs are clear. Cardiac exam reveals a grade 3-4/7 systolic murmur at the right upper sternal border. Abdomen with surgical incisions. She has no edema. Pedal pulses are good. Neuro alert and oriented x 3. No focal findings.  LABORATORY DATA: Lab Results  Component Value Date   WBC 9.4 05/22/2015   HGB 9.6* 05/22/2015   HCT 30.3* 05/22/2015   PLT 234 05/22/2015   GLUCOSE 152* 05/22/2015   CHOL 152 06/09/2014   TRIG 123.0 06/09/2014   HDL 48.40 06/09/2014   LDLDIRECT 98.0 06/09/2013   LDLCALC 79 06/09/2014   ALT 19 05/19/2015   AST 38 05/19/2015   NA 130* 05/22/2015   K 3.4* 05/22/2015   CL 93* 05/22/2015   CREATININE 0.60 05/22/2015   BUN 14 05/22/2015   CO2 27 05/22/2015   TSH 1.81 06/09/2014   INR 0.96 05/02/2015   HGBA1C 8.0* 06/09/2014   MICROALBUR 1.9 06/09/2013    Right/Left Heart Cath and Coronary/Graft Angiography    PACS Images    Show images for Cardiac catheterization     Link to Procedure Log    Procedure Log      Indications    Abnormal nuclear stress test [R93.1 (ICD-10-CM)]    Technique and Indications    Indication: 59 yo WF with history of CAD s/p CABG with early graft failure. S/p Taxus stenting x 2 of the LCx and OM1. Presents now with angina and high risk stress myoview. Pre op evaluation for surgery for abdominal mass.  Procedural Details: The right groin was prepped, draped, and anesthetized with 1% lidocaine.  Using the modified Seldinger technique a 5 Fr slender sheath was placed in the right femoral artery and a 7 French sheath was placed in the right femoral vein. A Swan-Ganz catheter was used for the right heart catheterization. Standard protocol was followed for recording of right heart pressures and sampling of oxygen saturations. Fick cardiac output was calculated. Standard Judkins catheters were used for selective coronary angiography and IMA and left ventriculography. There were no immediate procedural  complications. The patient was transferred to the post catheterization recovery area for further monitoring.  There were no immediate complications during the procedure.    Conclusion     There is severe left ventricular systolic dysfunction.  LM lesion, 50% stenosed.  Prox LAD lesion, 90% stenosed.  Mid LAD lesion, 100% stenosed.  1st Diag lesion, 95% stenosed.  Ramus-1 lesion, 75% stenosed.  Ramus-2 lesion, 80% stenosed.  Mid Cx to Dist Cx lesion, 100% stenosed.  Ost Cx to Mid Cx lesion, 99% stenosed. A drug-eluting stent was placed. The lesion was previously treated with a drug-eluting stent greater than two years ago.  Ost 1st Mrg to 1st Mrg lesion, 100% stenosed. A drug-eluting stent was placed. The lesion was previously treated with a drug-eluting stent greater than two years ago.  Ost RCA to Prox RCA lesion, 100% stenosed.  is normal in caliber, and is anatomically normal.  Prox RCA to Mid RCA lesion, 100% stenosed.  Dist RCA lesion, 100% stenosed.  1. Severe 3 vessel obstructive CAD.   - 50% distal left main stenosis.  - 100% occluded LAD, 99% first diagonal  - 75% proximal ramus intermediate. The intermediate bifurcates in the mid vessel and there is an 80% stenosis in the more superior branch  - 100% occlusion of the proximal LCx with diffuse occlusion of the stents in the LCx and first OM. There are left to left collaterals to the distal vessels.  - 100% occlusion of the proximal RCA. Faint right to right collaterals to the mid RCA. Left to right collaterals to the distal RCA.  2. Patent LIMA graft to the LAD  3. Known occlusion of all other bypass grafts from cardiac cath in 2007.  4. Severe LV dysfunction  5. Moderate pulmonary HTN  6. Normal LV filling pressures.   Recommendations: Continue medical management. She is not a candidate for redo CABG due to poor targets and prior early graft failure. She does not have suitable lesions for  PCI. Will increase carvedilol to 25 mg daily. She appears to be euvolemic by hemodynamics. Would consider proceeding with plans for surgery to treat abdominal mass. Cardiac risk will be high but she is stable and her medications are optimized. She may stop Plavix prior to surgical procedures.      Coronary Findings    Dominance: Right   Left Main   . LM lesion, 50% stenosed. calcified discrete . Distal left main     Left Anterior Descending   . Prox LAD lesion, 90% stenosed. calcified diffuse .   [name] Mid LAD lesion, 100% stenosed. chronic total occlusion .   [name] First Diagonal Branch   The vessel is small in size.   . 1st Diag lesion, 95% stenosed. diffuse .     Ramus Intermedius   . Ramus-1 lesion, 75% stenosed. discrete .   [name] Ramus-2 lesion, 80% stenosed.     Left Circumflex   . Ost Cx to Mid Cx lesion, 99% stenosed. diffuse . The lesion was previously treated with a drug-eluting  stent greater than two years ago.   . Mid Cx to Dist Cx lesion, 100% stenosed. diffuse .   [name] First Obtuse Marginal Branch   The vessel is small in size. 1st Mrg filled by collaterals from Dist LAD.   Colon Flattery 1st Mrg to 1st Mrg lesion, 100% stenosed. The lesion was previously treated with a drug-eluting stent greater than two years ago.   . Second Obtuse Marginal Branch   The vessel is small in size. 2nd Mrg filled by collaterals from Dist LAD.     Right Coronary Artery  Mid RCA filled by collaterals from Prox RCA.   Colon Flattery RCA to Prox RCA lesion, 100% stenosed. chronic total occlusion .   [name] Prox RCA to Mid RCA lesion, 100% stenosed.   . Dist RCA lesion, 100% stenosed.   . Right Posterior Descending Artery   The vessel is small in size. RPDA filled by collaterals from Dist LAD.   . Right Posterior Atrioventricular Branch   The vessel is small in size.     Graft Angiography    LIMA Graft to Mid LAD  is normal in caliber, and is anatomically normal.           Right Heart  Pressures Hemodynamic findings consistent with moderate pulmonary hypertension. LV EDP is normal.    Wall Motion                 Left Heart    Left Ventricle The left ventricle is enlarged. There is severe left ventricular systolic dysfunction. The left ventricular ejection fraction is 25-35% by visual estimate. There are wall motion abnormalities in the left ventricle. inferior wall akinesis, global hypokinesis There are segmental wall motion abnormalities in the left ventricle.    Coronary Diagrams    Diagnostic Diagram            Implants    Name ID Temporary Type Supply   No information to display    Hemo Data       Most Recent Value   Fick Cardiac Output  2.88 L/min   Fick Cardiac Output Index  1.83 (L/min)/BSA   RA A Wave  6 mmHg   RA V Wave  10 mmHg   RA Mean  6 mmHg   RV Systolic Pressure  41 mmHg   RV Diastolic Pressure  5 mmHg   RV EDP  9 mmHg   PA Systolic Pressure  46 mmHg   PA Diastolic Pressure  10 mmHg   PA Mean  28 mmHg   PW A Wave  11 mmHg   PW V Wave  11 mmHg   PW Mean  9 mmHg   AO Systolic Pressure  119 mmHg   AO Diastolic Pressure  56 mmHg   AO Mean  75 mmHg   LV Systolic Pressure  147 mmHg   LV Diastolic Pressure  8 mmHg   LV EDP  17 mmHg   Arterial Occlusion Pressure Extended Systolic Pressure  829 mmHg   Arterial Occlusion Pressure Extended Diastolic Pressure  69 mmHg   Arterial Occlusion Pressure Extended Mean Pressure  97 mmHg   Left Ventricular Apex Extended Systolic Pressure  562 mmHg   Left Ventricular Apex Extended Diastolic Pressure  5 mmHg   Left Ventricular Apex Extended EDP Pressure  21 mmHg   QP/QS  1   TPVR Index  15.26 HRUI   TSVR Index  40.9 HRUI   PVR SVR Ratio  0.28   TPVR/TSVR  Ratio  0.37    Order-Level Documents:    There are no order-level documents.    Encounter-Level Documents - 05/02/15:      Scan on 05/10/2015 10:17 AM by Provider Default, MDScan on 05/10/2015  10:17 AM by Provider Default, MD     Scan on 05/10/2015 9:20 AM by Provider Default, MDScan on 05/10/2015 9:20 AM by Provider Default, MD     Scan on 05/06/2015 2:37 PM by Provider Default, MDScan on 05/06/2015 2:37 PM by Provider Default, MD     Electronic signature on 05/06/2015 11:38 AM    Signed    Electronically signed by Herlinda Heady M Martinique, MD on 05/06/15 at 1512 EDT     Assessment / Plan: 1. CAD S/p CABG. Failed bypass grafts except for LIMA to LAD. S/p remote stenting of LCX and OM with Taxus DES now occluded. Stable class 2 angina. Continue Rx. With ASA, plavix, nitrates, coreg, and Ranexa. Stressed importance of smoking cessation.  2. Carotid arterial disese- moderate, bilateral. Repeat carotid dopplers in July stable. Repeat in one year.  3. Tobacco abuse. Recommend complete cessation. Patient states she is going to keep trying to quit. 4. HTN controlled. 5. Chronic combined systolic and diastolic CHF. Severe LV dysfunction. By cath EDP was normal.  Continue lasix. Sodium restriction. 6. Pulmonary HTN. 7. S/p major abdominal surgery for cystadenoma. No complications.

## 2015-05-27 ENCOUNTER — Ambulatory Visit: Payer: Medicare Other | Attending: Gynecologic Oncology | Admitting: Gynecologic Oncology

## 2015-05-27 ENCOUNTER — Encounter: Payer: Self-pay | Admitting: Gynecologic Oncology

## 2015-05-27 VITALS — BP 96/53 | HR 80 | Temp 98.4°F | Resp 18 | Ht <= 58 in | Wt 129.4 lb

## 2015-05-27 DIAGNOSIS — IMO0001 Reserved for inherently not codable concepts without codable children: Secondary | ICD-10-CM

## 2015-05-27 DIAGNOSIS — G8918 Other acute postprocedural pain: Secondary | ICD-10-CM | POA: Insufficient documentation

## 2015-05-27 DIAGNOSIS — T814XXA Infection following a procedure, initial encounter: Secondary | ICD-10-CM | POA: Diagnosis not present

## 2015-05-27 MED ORDER — OXYCODONE-ACETAMINOPHEN 5-325 MG PO TABS: 1.0000 | ORAL_TABLET | Freq: Four times a day (QID) | ORAL | Status: DC | PRN

## 2015-05-27 MED ORDER — CEPHALEXIN 500 MG PO CAPS: 500.0000 mg | ORAL_CAPSULE | Freq: Four times a day (QID) | ORAL | Status: DC

## 2015-05-27 NOTE — Patient Instructions (Addendum)
Plan to take Keflex four times daily for 7 days for post-op incisional cellulitis.  Call for any questions or concerns.  Call if the firmness and/or redness persists or worsens, fever develops, etc.

## 2015-05-27 NOTE — Progress Notes (Signed)
Follow Up Note: Gyn-Onc  Diane [name] 59 y.o. female  CC:  Chief Complaint  Patient presents with  . Suture / Staple Removal    post-op    HPI:  Diane [name] is a 59 year old, gravida 0,  seen in consultation at the request of Dr. Charlesetta Garibaldi for a 25 cm mostly cystic pelvic mass arising from likely the left ovary. She had symptoms of intermittent nausea for the past month and noted distention of the abdomen. She reported this to her provider who scheduled her for a CT scan of the abdomen and pelvis with contrast, which was performed on 03/28/2015. It revealed mild left hydronephrosis associated with obstruction from a 24 x 25 x 16 cm cystic mass in the central abdomen and pelvis with multiple internal septations. The mass was asymmetric towards the left anterior abdominal wall. There was no ascites. There was no lymphadenopathy. There is no omental cake or carcinomatosis.  Follow-up ultrasound scan was performed by Dr. Charlesetta Garibaldi on 03/30/2015, which revealed a small normal uterus measuring 5.4 x 4.1 x 2.9 cm with a thin endometrial stripe of 1.3 mm. A large cystic mass was also confirmed arising from the pelvis.  Medical history/issues include MI in 2007, CABG, coronary stent, tobacco abuse, cerebrovascular disease, obstructive sleep apnea, diabetes, and substantial surgical history.  She was seen by Dr. Peter Martinique prior to surgery with no interventions recommended to improve cardiac status and with the patient being viewed as high surgical risk.  She recently had a breast biopsy on 05/17/15 that resulted benign breast tissue with calcifications.  On 05/19/15, she underwent an exploratory laparotomy with bilateral salpingo-oophorectomy, omentectomy sigmoid colectomy with reanastamosis by Dr. Everitt Amber.  Final pathology revealed: 1. Ovary and fallopian tube, left - MUCINOUS CYSTADENOMA, MULTILOCULAR. - NO BORDERLINE CHANGE OR MALIGNANCY. - BENIGN FALLOPIAN TUBE.  2. Ovary and fallopian tube, right -  UNREMARKABLE OVARY AND FALLOPIAN TUBE. - NO ENDOMETRIOSIS OR EVIDENCE OF MALIGNANCY. 3. Omentum, resection for tumor - BENIGN ADIPOSE TISSUE CONSISTENT WITH OMENTUM.  4. Colon, segmental resection, sigmoid - BENIGN COLON, CLINICALLY SIGMOID. - NO EVIDENCE OF MALIGNANCY.  Her post-operative course was uneventful.    Interval History:  She presents today alone for post-operative follow up and staple removal.  She reports doing well since surgery.  Tolerating diet with no nausea or emesis reported.  Ambulating without difficulty.  Has had one BM since surgery and has been taking colace.  Stating it takes a little longer for urine to leave the bladder but denies dysuria, hematuria, fever, chills.  Pain controlled with percocet use.  Requesting refill.  Abdominal incision healing well.  Stating the erythema on her abdomen began this am and she relates it to the large amount of gas she had.  Denies vaginal or rectal bleeding.  "My breast biopsy was clear too!"  She states she has had a previous incision that did not heal and she had to have it "cleaned out."  No concerns voiced.  Review of Systems  Constitutional: Feels well.  No fever, chills, early satiety, unintentional weight loss or gain.  Cardiovascular: No chest pain, shortness of breath, or edema.  Pulmonary: No cough or wheeze.  Gastrointestinal: No nausea, vomiting, or diarrhea. No bright red blood per rectum or change in bowel movement.  Genitourinary: No frequency, urgency, or dysuria. No vaginal bleeding or discharge.  Musculoskeletal: No myalgia or joint pain. Neurologic: No weakness, numbness, or change in gait.  Psychology: No depression, anxiety, or insomnia.  Current Meds:  Outpatient Encounter Prescriptions as of 05/27/2015  Medication Sig  . albuterol (PROVENTIL HFA;VENTOLIN HFA) 108 (90 BASE) MCG/ACT inhaler Inhale 2 puffs into the lungs every 6 (six) hours as needed for wheezing or shortness of breath.  [name] albuterol (PROVENTIL) (2.5  MG/3ML) 0.083% nebulizer solution USE 1 VIAL PER NEBULIZRE EVERY 6 HOURS AS NEEDED *HOSPITALIST DOESNT DO PA  . aspirin EC 81 MG tablet Take 1 tablet (81 mg total) by mouth daily.  . carvedilol (COREG) 25 MG tablet Take 1 tablet (25 mg total) by mouth 2 (two) times daily with a meal.  . clopidogrel (PLAVIX) 75 MG tablet Take 1 tablet (75 mg total) by mouth daily.  [name] DEXILANT 60 MG capsule TAKE 1 CAPSULE (60 MG TOTAL) BY MOUTH DAILY.  . fluticasone (FLONASE) 50 MCG/ACT nasal spray Place 2 sprays into both nostrils daily.  . furosemide (LASIX) 40 MG tablet Take 1 tablet (40 mg total) by mouth daily.  [name] gabapentin (NEURONTIN) 300 MG capsule TAKE 1 CAPSULE (300 MG TOTAL) BY MOUTH 3 (THREE) TIMES DAILY. (Patient taking differently: takes 2 in the pm)  . glimepiride (AMARYL) 2 MG tablet Take 2 mg by mouth daily as needed (if sugar is above 140 take additional tab at bedtime).   [name] guaiFENesin (MUCINEX) 600 MG 12 hr tablet Take 2 tablets (1,200 mg total) by mouth 2 (two) times daily.  . hydrochlorothiazide (HYDRODIURIL) 50 MG tablet Take 1 tablet (50 mg total) by mouth daily.  . isosorbide mononitrate (IMDUR) 30 MG 24 hr tablet Take 1 tablet (30 mg total) by mouth 2 (two) times daily.  [name] levETIRAcetam (KEPPRA) 750 MG tablet Take 1 tablet (750 mg total) by mouth 2 (two) times daily.  [name] losartan (COZAAR) 100 MG tablet Take 1 tablet (100 mg total) by mouth daily.  . metFORMIN (GLUCOPHAGE-XR) 500 MG 24 hr tablet Take 1,000 mg by mouth 2 (two) times daily.  . ondansetron (ZOFRAN) 4 MG tablet Take 1 tablet (4 mg total) by mouth every 8 (eight) hours as needed for nausea or vomiting (take 1-2 tablets as needed every 8 hours for nausea).  [name] oxyCODONE-acetaminophen (ROXICET) 5-325 MG per tablet Take 1-2 tablets by mouth every 6 (six) hours as needed for severe pain.  . phenytoin (DILANTIN) 100 MG ER capsule Take 100 mg by mouth 2 (two) times daily.  . potassium chloride (K-DUR) 10 MEQ tablet Take 1 tablet (10 mEq  total) by mouth 2 (two) times daily.  . ranolazine (RANEXA) 500 MG 12 hr tablet Take 1 tablet (500 mg total) by mouth 2 (two) times daily.  . rosuvastatin (CRESTOR) 10 MG tablet Take 1 tablet (10 mg total) by mouth daily.  . VOLTAREN 1 % GEL Apply 2 g topically as needed (pain).   . [DISCONTINUED] oxyCODONE-acetaminophen (ROXICET) 5-325 MG per tablet Take 1-2 tablets by mouth every 6 (six) hours as needed for severe pain.  . cephALEXin (KEFLEX) 500 MG capsule Take 1 capsule (500 mg total) by mouth 4 (four) times daily.  . cyclobenzaprine (FLEXERIL) 10 MG tablet Take 10 mg by mouth at bedtime.   . diphenhydrAMINE (BENADRYL) 25 mg capsule Take 2 capsules in AM and 1 capsule at night (Patient not taking: Reported on 05/27/2015)  . nitroGLYCERIN (NITROSTAT) 0.4 MG SL tablet Place 1 tablet (0.4 mg total) under the tongue every 5 (five) minutes as needed. (Patient not taking: Reported on 05/27/2015)  . [DISCONTINUED] acetaminophen-codeine (TYLENOL #3) 300-30 MG per tablet Take 1 tablet by mouth  every 6 (six) hours.   No facility-administered encounter medications on file as of 05/27/2015.    Allergy:  Allergies  Allergen Reactions  . Ace Inhibitors     REACTION: cough  . Bee Venom   . Tetracycline     REACTION: reaction not known    Social Hx:   Social History   Social History  . Marital Status: Divorced    Spouse Name: N/A  . Number of Children: N/A  . Years of Education: N/A   Occupational History  . Not on file.   Social History Main Topics  . Smoking status: Current Every Day Smoker -- 2.00 packs/day for 49 years    Types: Cigarettes  . Smokeless tobacco: Never Used  . Alcohol Use: No  . Drug Use: No  . Sexual Activity:    Partners: Male    Birth Control/ Protection: None   Other Topics Concern  . Not on file   Social History Narrative    Past Surgical Hx:  Past Surgical History  Procedure Laterality Date  . Coronary stent placement  08/11/06    PCI of her  ciurcumflex/OM vessel  . Cardiac catheterization  11/29/05    EF of 55%  . Cardiac catheterization  08/06/06    EF of 45-50%  . Coronary artery bypass graft  12/04/2005    x5 -- left internal mammary artery to the LAD, left radial artery to the ramus intermedius, saphenous vein graft to the obtuse marginal 1, sequential saphenous vein grat to the acute marginal and posterior descending, endoscopic vein harvesting from the left thigh with open vein harvest from right leg  . Cholecystectomy    . Appendectomy    . Cervical fusion  1990  . Cardiac catheterization N/A 05/06/2015    Procedure: Right/Left Heart Cath and Coronary/Graft Angiography;  Surgeon: Peter M Martinique, MD;  Location: [facility];  Service: Cardiovascular;  Laterality: N/A;  . Laparotomy Bilateral 05/19/2015    Procedure: EXPLORATORY LAPAROTOMY WITH BILATERAL SALPINGO OOPHORECTOMY /OMENTECTOMY/SEGMENTAL SIGMOID COLECTOMY ;  Surgeon: Everitt Amber, MD;  Location: [facility];  Service: Gynecology;  Laterality: Bilateral;    Past Medical Hx:  Past Medical History  Diagnosis Date  . Coronary artery disease     post CABG in 3/07   . Hypertension   . Hyperlipidemia   . Dyslipidemia   . Diabetes mellitus     type 2  . Chronic bronchitis   . CVA (cerebral infarction) 1993  . Tobacco abuse   . COPD (chronic obstructive pulmonary disease)   . Stroke 1993  . Carotid stenosis   . Systolic murmur     known mild AS and MR  . PONV (postoperative nausea and vomiting)     only once was patient sick in the 1980s   . Myocardial infarction   . CHF (congestive heart failure)   . Sleep apnea     used to wear a cpap- not used in 3 years   . Shortness of breath dyspnea     with exertion or when fluid builds up   . Pneumonia     hx of   . Goiter   . GERD (gastroesophageal reflux disease)   . Seizures     last seizure- 03/2013   . Headache     hx of   . Fibromyalgia   . Anemia     Family Hx:  Family History  Problem Relation Age of  Onset  . Coronary artery disease    .  Heart disease    . Heart disease Mother   . Diabetes Mother   . COPD Mother   . Hyperlipidemia Mother   . Hypertension Mother   . Cancer Father   . Drug abuse Paternal Grandmother   . Stroke Paternal Grandfather   . Cancer Paternal Aunt     lung with mets to brain  . Cancer Paternal Uncle     skin cancer  . Cancer Paternal Uncle     lung/liver to brain  . Cancer Paternal Uncle     cancer of unknown type    Vitals:  Blood pressure 96/53, pulse 80, temperature 98.4 F (36.9 C), temperature source Oral, resp. rate 18, height '4\' 10"'$  (1.473 m), weight 129 lb 6.4 oz (58.695 kg), last menstrual period 09/02/2003, SpO2 98 %.  Physical Exam:  General: Well developed, well nourished female in no acute distress. Alert and oriented x 3.  Cardiovascular: Regular rate and rhythm. S1 and S2 normal.  Lungs: Clear to auscultation bilaterally. No wheezes/crackles/rhonchi noted.  Skin: No rashes or lesions present. Back: No CVA tenderness.  Abdomen: Abdomen soft, non-tender and obese.  Mildly distended but soft.  Active bowel sounds in all quadrants. No evidence of a fluid wave or abdominal masses.  Extremities: No bilateral cyanosis, edema, or clubbing.  25 staples removed from the midline incision without difficulty.  2 cm of erythema surrounding the upper aspect of the incision with firmness and increased warmth.  Steri strips applied.  No drainage present.  Assessment/Plan:  59 year old female s/p exploratory laparotomy with bilateral salpingo-oophorectomy, omentectomy sigmoid colectomy with reanastamosis by Dr. Everitt Amber on 05/19/15 for a mucinous cystadenoma with no borderline change or malignancy.  Final pathology discussed.  She is doing well post-operatively.  Due to increased warmth, firmness, and erythema of the incision along with history of diabetes, she will be placed on Keflex 500 mg four times daily for seven days to treat incisional cellulitis-  Dr. Denman George made aware.  Reportable signs and symptoms reviewed.  Refill on percocet given.  She is to follow up as scheduled or sooner if needed.  She is advised to call for any questions or concerns or if the incisional erythema/firmness/warmth increases or persists.  Post-operative instructions reinforced.   CROSS, MELISSA DEAL, NP 05/27/2015, 2:11 PM

## 2015-05-30 ENCOUNTER — Ambulatory Visit: Payer: Medicare Other | Attending: Gynecologic Oncology | Admitting: Gynecologic Oncology

## 2015-05-30 ENCOUNTER — Other Ambulatory Visit: Payer: Self-pay

## 2015-05-30 ENCOUNTER — Encounter: Payer: Self-pay | Admitting: Gynecologic Oncology

## 2015-05-30 VITALS — BP 108/60 | HR 87 | Temp 97.8°F | Ht <= 58 in | Wt 125.1 lb

## 2015-05-30 DIAGNOSIS — T814XXA Infection following a procedure, initial encounter: Secondary | ICD-10-CM | POA: Insufficient documentation

## 2015-05-30 DIAGNOSIS — T8149XA Infection following a procedure, other surgical site, initial encounter: Secondary | ICD-10-CM | POA: Insufficient documentation

## 2015-05-30 DIAGNOSIS — IMO0001 Reserved for inherently not codable concepts without codable children: Secondary | ICD-10-CM

## 2015-05-30 MED ORDER — OXYCODONE HCL 5 MG PO TABS: 5.0000 mg | ORAL_TABLET | ORAL | Status: DC | PRN

## 2015-05-30 NOTE — Patient Instructions (Signed)
Please pack the opening tightly twice daily.  Call if the red area on the lower incision begins to drain, fever develops, etc.  Please call for any questions or concerns.

## 2015-05-30 NOTE — Patient Outreach (Signed)
Call made to patient to assess needs for community care coordination. Patient identified herself by providing date of birth and address.  Patient stated she has an appointment at the [facility]. Patient states he surgical wound is draining and she needs to have it checked out.  This RNCM and patient agreed to a follow up call this afternoon to assess further needs for community resources. Call made to patient this afternoon, patient stated she was seen at the cancer center by the nurse practitioner who assessed the wound, ordered her to do every 12 hour wound packing with medicated strips. patient stated she is to do it for the first time tonight, requested a call in the morning to check on her progress.  Patient and this RNCM agreed to make contact via telephone in the morning.  Plan: Telephone call on August 30

## 2015-05-30 NOTE — Progress Notes (Signed)
Follow Up Note: Gyn-Onc  Diane [name] 59 y.o. female  CC:  Chief Complaint  Patient presents with  . wound drainage   Assessment/Plan:  59 year old female s/p exploratory laparotomy with bilateral salpingo-oophorectomy, omentectomy sigmoid colectomy with reanastamosis on 05/19/15 for a mucinous cystadenoma with no borderline change or malignancy.    Wound cellulitis and drainage and dehiscence on exam. Continue keflex. Continue BID dressing changes with iodoform tape. Patient notified to return if she develops fevers or increased drainage from incision or if there is increasing redness or drainage from the lower part of the incision which may also need to be opened.   HPI:  Diane [name] is a 59 year old, gravida 0,  seen in consultation at the request of Dr. Charlesetta Garibaldi for a 25 cm mostly cystic pelvic mass arising from likely the left ovary. She had symptoms of intermittent nausea for the past month and noted distention of the abdomen. She reported this to her provider who scheduled her for a CT scan of the abdomen and pelvis with contrast, which was performed on 03/28/2015. It revealed mild left hydronephrosis associated with obstruction from a 24 x 25 x 16 cm cystic mass in the central abdomen and pelvis with multiple internal septations. The mass was asymmetric towards the left anterior abdominal wall. There was no ascites. There was no lymphadenopathy. There is no omental cake or carcinomatosis.  Follow-up ultrasound scan was performed by Dr. Charlesetta Garibaldi on 03/30/2015, which revealed a small normal uterus measuring 5.4 x 4.1 x 2.9 cm with a thin endometrial stripe of 1.3 mm. A large cystic mass was also confirmed arising from the pelvis.  Medical history/issues include MI in 2007, CABG, coronary stent, tobacco abuse, cerebrovascular disease, obstructive sleep apnea, diabetes, and substantial surgical history.  She was seen by Dr. Peter Martinique prior to surgery with no interventions recommended to  improve cardiac status and with the patient being viewed as high surgical risk.  She recently had a breast biopsy on 05/17/15 that resulted benign breast tissue with calcifications.  On 05/19/15, she underwent an exploratory laparotomy with bilateral salpingo-oophorectomy, omentectomy sigmoid colectomy with reanastamosis by Dr. Everitt Amber.  Final pathology revealed: 1. Ovary and fallopian tube, left - MUCINOUS CYSTADENOMA, MULTILOCULAR. - NO BORDERLINE CHANGE OR MALIGNANCY. - BENIGN FALLOPIAN TUBE.  2. Ovary and fallopian tube, right - UNREMARKABLE OVARY AND FALLOPIAN TUBE. - NO ENDOMETRIOSIS OR EVIDENCE OF MALIGNANCY. 3. Omentum, resection for tumor - BENIGN ADIPOSE TISSUE CONSISTENT WITH OMENTUM.  4. Colon, segmental resection, sigmoid - BENIGN COLON, CLINICALLY SIGMOID. - NO EVIDENCE OF MALIGNANCY.  Her post-operative course was uneventful.    Interval History:  She presents today alone for evaluation of drainage from her wound which started 2 days ago. She was seen in the office 3 days ago and was noted to have erythema and hardness at the superior aspect of the wound. She was empirically started on keflex. She denies fevers. She denies feculent drainage. Bowels are moving well. No nausea or emesis. She has increased pain in wound which is not relieved by percocet.  Review of Systems  Constitutional: Feels well.  No fever, chills, early satiety, unintentional weight loss or gain.  Cardiovascular: No chest pain, shortness of breath, or edema.  Pulmonary: No cough or wheeze.  Gastrointestinal: No nausea, vomiting, or diarrhea. No bright red blood per rectum or change in bowel movement.  Genitourinary: No frequency, urgency, or dysuria. No vaginal bleeding or discharge.  Musculoskeletal: No myalgia or joint  pain. Neurologic: No weakness, numbness, or change in gait.  Psychology: No depression, anxiety, or insomnia.  Current Meds:  Outpatient Encounter Prescriptions as of 05/30/2015  Medication  Sig  . albuterol (PROVENTIL HFA;VENTOLIN HFA) 108 (90 BASE) MCG/ACT inhaler Inhale 2 puffs into the lungs every 6 (six) hours as needed for wheezing or shortness of breath.  [name] albuterol (PROVENTIL) (2.5 MG/3ML) 0.083% nebulizer solution USE 1 VIAL PER NEBULIZRE EVERY 6 HOURS AS NEEDED *HOSPITALIST DOESNT DO PA  . aspirin EC 81 MG tablet Take 1 tablet (81 mg total) by mouth daily.  . carvedilol (COREG) 25 MG tablet Take 1 tablet (25 mg total) by mouth 2 (two) times daily with a meal.  . cephALEXin (KEFLEX) 500 MG capsule Take 1 capsule (500 mg total) by mouth 4 (four) times daily.  . clopidogrel (PLAVIX) 75 MG tablet Take 1 tablet (75 mg total) by mouth daily.  . cyclobenzaprine (FLEXERIL) 10 MG tablet Take 10 mg by mouth at bedtime.   [name] DEXILANT 60 MG capsule TAKE 1 CAPSULE (60 MG TOTAL) BY MOUTH DAILY.  . fluticasone (FLONASE) 50 MCG/ACT nasal spray Place 2 sprays into both nostrils daily.  . furosemide (LASIX) 40 MG tablet Take 1 tablet (40 mg total) by mouth daily.  [name] gabapentin (NEURONTIN) 300 MG capsule TAKE 1 CAPSULE (300 MG TOTAL) BY MOUTH 3 (THREE) TIMES DAILY. (Patient taking differently: takes 2 in the pm)  . glimepiride (AMARYL) 2 MG tablet Take 2 mg by mouth daily as needed (if sugar is above 140 take additional tab at bedtime).   [name] guaiFENesin (MUCINEX) 600 MG 12 hr tablet Take 2 tablets (1,200 mg total) by mouth 2 (two) times daily.  . hydrochlorothiazide (HYDRODIURIL) 50 MG tablet Take 1 tablet (50 mg total) by mouth daily.  . isosorbide mononitrate (IMDUR) 30 MG 24 hr tablet Take 1 tablet (30 mg total) by mouth 2 (two) times daily.  [name] levETIRAcetam (KEPPRA) 750 MG tablet Take 1 tablet (750 mg total) by mouth 2 (two) times daily.  [name] losartan (COZAAR) 100 MG tablet Take 1 tablet (100 mg total) by mouth daily.  . metFORMIN (GLUCOPHAGE-XR) 500 MG 24 hr tablet Take 1,000 mg by mouth 2 (two) times daily.  . ondansetron (ZOFRAN) 4 MG tablet Take 1 tablet (4 mg total) by mouth every 8  (eight) hours as needed for nausea or vomiting (take 1-2 tablets as needed every 8 hours for nausea).  [name] oxyCODONE-acetaminophen (ROXICET) 5-325 MG per tablet Take 1-2 tablets by mouth every 6 (six) hours as needed for severe pain.  . phenytoin (DILANTIN) 100 MG ER capsule Take 100 mg by mouth 2 (two) times daily.  . potassium chloride (K-DUR) 10 MEQ tablet Take 1 tablet (10 mEq total) by mouth 2 (two) times daily.  . ranolazine (RANEXA) 500 MG 12 hr tablet Take 1 tablet (500 mg total) by mouth 2 (two) times daily.  . rosuvastatin (CRESTOR) 10 MG tablet Take 1 tablet (10 mg total) by mouth daily.  . VOLTAREN 1 % GEL Apply 2 g topically as needed (pain).   [name] diphenhydrAMINE (BENADRYL) 25 mg capsule Take 2 capsules in AM and 1 capsule at night (Patient not taking: Reported on 05/27/2015)  . nitroGLYCERIN (NITROSTAT) 0.4 MG SL tablet Place 1 tablet (0.4 mg total) under the tongue every 5 (five) minutes as needed. (Patient not taking: Reported on 05/27/2015)  . oxyCODONE (OXY IR/ROXICODONE) 5 MG immediate release tablet Take 1 tablet (5 mg total) by mouth every 2 (two)  hours as needed for severe pain.   No facility-administered encounter medications on file as of 05/30/2015.    Allergy:  Allergies  Allergen Reactions  . Ace Inhibitors     REACTION: cough  . Bee Venom   . Tetracycline     REACTION: reaction not known    Social Hx:   Social History   Social History  . Marital Status: Divorced    Spouse Name: N/A  . Number of Children: N/A  . Years of Education: N/A   Occupational History  . Not on file.   Social History Main Topics  . Smoking status: Current Every Day Smoker -- 2.00 packs/day for 49 years    Types: Cigarettes  . Smokeless tobacco: Never Used  . Alcohol Use: No  . Drug Use: No  . Sexual Activity:    Partners: Male    Birth Control/ Protection: None   Other Topics Concern  . Not on file   Social History Narrative    Past Surgical Hx:  Past Surgical History   Procedure Laterality Date  . Coronary stent placement  08/11/06    PCI of her ciurcumflex/OM vessel  . Cardiac catheterization  11/29/05    EF of 55%  . Cardiac catheterization  08/06/06    EF of 45-50%  . Coronary artery bypass graft  12/04/2005    x5 -- left internal mammary artery to the LAD, left radial artery to the ramus intermedius, saphenous vein graft to the obtuse marginal 1, sequential saphenous vein grat to the acute marginal and posterior descending, endoscopic vein harvesting from the left thigh with open vein harvest from right leg  . Cholecystectomy    . Appendectomy    . Cervical fusion  1990  . Cardiac catheterization N/A 05/06/2015    Procedure: Right/Left Heart Cath and Coronary/Graft Angiography;  Surgeon: Peter M Martinique, MD;  Location: [facility];  Service: Cardiovascular;  Laterality: N/A;  . Laparotomy Bilateral 05/19/2015    Procedure: EXPLORATORY LAPAROTOMY WITH BILATERAL SALPINGO OOPHORECTOMY /OMENTECTOMY/SEGMENTAL SIGMOID COLECTOMY ;  Surgeon: Everitt Amber, MD;  Location: [facility];  Service: Gynecology;  Laterality: Bilateral;    Past Medical Hx:  Past Medical History  Diagnosis Date  . Coronary artery disease     post CABG in 3/07   . Hypertension   . Hyperlipidemia   . Dyslipidemia   . Diabetes mellitus     type 2  . Chronic bronchitis   . CVA (cerebral infarction) 1993  . Tobacco abuse   . COPD (chronic obstructive pulmonary disease)   . Stroke 1993  . Carotid stenosis   . Systolic murmur     known mild AS and MR  . PONV (postoperative nausea and vomiting)     only once was patient sick in the 1980s   . Myocardial infarction   . CHF (congestive heart failure)   . Sleep apnea     used to wear a cpap- not used in 3 years   . Shortness of breath dyspnea     with exertion or when fluid builds up   . Pneumonia     hx of   . Goiter   . GERD (gastroesophageal reflux disease)   . Seizures     last seizure- 03/2013   . Headache     hx of   .  Fibromyalgia   . Anemia     Family Hx:  Family History  Problem Relation Age of Onset  . Coronary artery  disease    . Heart disease    . Heart disease Mother   . Diabetes Mother   . COPD Mother   . Hyperlipidemia Mother   . Hypertension Mother   . Cancer Father   . Drug abuse Paternal Grandmother   . Stroke Paternal Grandfather   . Cancer Paternal Aunt     lung with mets to brain  . Cancer Paternal Uncle     skin cancer  . Cancer Paternal Uncle     lung/liver to brain  . Cancer Paternal Uncle     cancer of unknown type    Vitals:  Blood pressure 108/60, pulse 87, temperature 97.8 F (36.6 C), temperature source Oral, height '4\' 10"'$  (1.473 m), weight 125 lb 1.6 oz (56.745 kg), last menstrual period 09/02/2003, SpO2 98 %.  Physical Exam:  General: Well developed, well nourished female in no acute distress. Alert and oriented x 3.  Cardiovascular: deferred Lungs: deferred Skin: No rashes or lesions present. Back: deferred Abdomen: Abdomen soft, non-tender and obese.  Mildly distended but soft.  Active bowel sounds in all quadrants. No evidence of a fluid wave or abdominal masses.   There is receding erythema at superior aspect of wound, and a 1cm opening in the upper aspect of the incision which drains purulent fluid with expression. Probing reveals intact fascia and a larger cavity underneath. There is erythema surrounding the edges of the most inferior aspect of the wound with no fluctuance.  Extremities: No bilateral cyanosis, edema, or clubbing.   Procedure: Purulent fluid expressed from wound and drained. Cavity explored. Wound packed with iodoform gauze tape and patient instructed on this technique.     Donaciano Eva, NP 05/30/2015, 1:15 PM

## 2015-06-03 ENCOUNTER — Other Ambulatory Visit: Payer: Self-pay | Admitting: *Deleted

## 2015-06-03 ENCOUNTER — Telehealth: Payer: Self-pay | Admitting: *Deleted

## 2015-06-03 DIAGNOSIS — G8918 Other acute postprocedural pain: Secondary | ICD-10-CM

## 2015-06-03 DIAGNOSIS — IMO0001 Reserved for inherently not codable concepts without codable children: Secondary | ICD-10-CM

## 2015-06-03 DIAGNOSIS — T814XXA Infection following a procedure, initial encounter: Secondary | ICD-10-CM

## 2015-06-03 MED ORDER — CEPHALEXIN 500 MG PO CAPS: 500.0000 mg | ORAL_CAPSULE | Freq: Four times a day (QID) | ORAL | Status: DC

## 2015-06-03 NOTE — Telephone Encounter (Signed)
Received phone call from patient this morning stating that her abdominal incision is still red especially at the bottom of the incision. She also states she is out of the iodoform packing. Instructed patient to come to [facility] to pickup more packing.  While here, Dr. Denman George assessed incision and prescribed 1 additional week of keflex.   Prescription sent to patient's pharmacy and patient agreeable to pick-up prescription. No other questions or concerns noted at this time.

## 2015-06-07 ENCOUNTER — Other Ambulatory Visit: Payer: Self-pay | Admitting: Gynecologic Oncology

## 2015-06-07 ENCOUNTER — Other Ambulatory Visit: Payer: Medicare Other | Admitting: *Deleted

## 2015-06-07 ENCOUNTER — Ambulatory Visit: Payer: Medicare Other

## 2015-06-07 ENCOUNTER — Telehealth: Payer: Self-pay | Admitting: Gynecologic Oncology

## 2015-06-07 DIAGNOSIS — G8918 Other acute postprocedural pain: Secondary | ICD-10-CM

## 2015-06-07 DIAGNOSIS — T814XXA Infection following a procedure, initial encounter: Secondary | ICD-10-CM | POA: Diagnosis not present

## 2015-06-07 DIAGNOSIS — IMO0001 Reserved for inherently not codable concepts without codable children: Secondary | ICD-10-CM

## 2015-06-07 MED ORDER — OXYCODONE-ACETAMINOPHEN 5-325 MG PO TABS: 1.0000 | ORAL_TABLET | Freq: Four times a day (QID) | ORAL | Status: DC | PRN

## 2015-06-07 NOTE — Telephone Encounter (Signed)
Returned call to patient.  Left message for patient to call the office to discuss request for pain medication.

## 2015-06-07 NOTE — Progress Notes (Signed)
Patient called this am requesting refill on pain medication.  Patient stating her incision is still red and she feels it is not getting better on the antibiotics.  Her incisional pain went to an 8 over the weekend because she was only able to take one pain pill since her supply was low.  Not sure why her pain is getting worse.  Stating the upper incision is still draining puss.  The lower incisional redness is still present but the firmness has lessened some.  Denies fever, chills.  Having bowel movements.  Patient to come and pick up prescription at the [facility].  Advised to notify the front desk when she arrives so her incision could be assessed.  Patient presented to the office at 11:15am without an appt for incision assessment and to pick up new script for pain medication.  The redness overall has lessened.  Upper opening in the incision with minimal drainage.  Culture obtained.  Incision repacked with iodoform packing.  Measuring 2.5 cm in the upper aspect.  No tunneling present with the right, left, and lower aspect of the opening.  2 cm of firmness present around the lower aspect of the incision.  Increased warmth.  No drainage expressed with palpation and no opening formed.  Advised to continue taking antibiotics as prescribed and the situation would be discussed with Dr. Denman George.  Culture per Dr. Denman George as well as continue with Keflex as ordered.  Patient informed of this at 14:05pm and advised to call for any concerns or questions.  Reportable signs and symptoms reviewed as well.

## 2015-06-08 ENCOUNTER — Telehealth: Payer: Self-pay | Admitting: Family Medicine

## 2015-06-08 NOTE — Telephone Encounter (Signed)
Opened in error

## 2015-06-10 ENCOUNTER — Telehealth: Payer: Self-pay | Admitting: Gynecologic Oncology

## 2015-06-10 DIAGNOSIS — IMO0001 Reserved for inherently not codable concepts without codable children: Secondary | ICD-10-CM

## 2015-06-10 DIAGNOSIS — T814XXD Infection following a procedure, subsequent encounter: Principal | ICD-10-CM

## 2015-06-10 LAB — WOUND CULTURE

## 2015-06-10 MED ORDER — CIPROFLOXACIN HCL 250 MG PO TABS: 250.0000 mg | ORAL_TABLET | Freq: Two times a day (BID) | ORAL | Status: DC

## 2015-06-10 MED ORDER — PROBIOTIC PO CAPS: 1.0000 | ORAL_CAPSULE | Freq: Two times a day (BID) | ORAL | Status: DC

## 2015-06-10 NOTE — Telephone Encounter (Signed)
Spoke with patient about culture results and Dr. Serita Grit recommendations to switch to Cipro in the incision is still red and firm per patient.  Patient denies allergy to Cipro and thinks she has taken it before.  Advised to stop taking keflex and to begin Cipro '250mg'$  BID for 7 days.  Also advised to take a probiotic as well per Dr. Denman George.  Advised of rare side effect of achilles tendon rupture along with c diff after antibiotic use.  She is to follow up on Monday as scheduled.

## 2015-06-13 ENCOUNTER — Encounter: Payer: Self-pay | Admitting: Gynecologic Oncology

## 2015-06-13 ENCOUNTER — Ambulatory Visit: Payer: Medicare Other | Attending: Gynecologic Oncology | Admitting: Gynecologic Oncology

## 2015-06-13 VITALS — BP 114/67 | HR 78 | Temp 98.2°F | Resp 16 | Ht <= 58 in | Wt 124.0 lb

## 2015-06-13 DIAGNOSIS — G8918 Other acute postprocedural pain: Secondary | ICD-10-CM | POA: Diagnosis not present

## 2015-06-13 DIAGNOSIS — T814XXA Infection following a procedure, initial encounter: Secondary | ICD-10-CM | POA: Insufficient documentation

## 2015-06-13 DIAGNOSIS — IMO0001 Reserved for inherently not codable concepts without codable children: Secondary | ICD-10-CM

## 2015-06-13 MED ORDER — OXYCODONE-ACETAMINOPHEN 5-325 MG PO TABS: 1.0000 | ORAL_TABLET | Freq: Four times a day (QID) | ORAL | Status: DC | PRN

## 2015-06-13 NOTE — Progress Notes (Signed)
Follow Up Note: Gyn-Onc  Diane [name] 59 y.o. female  CC:  Chief Complaint  Patient presents with  . Wound/cellulitis -post op   Assessment/Plan:  59 year old female s/p exploratory laparotomy with bilateral salpingo-oophorectomy, omentectomy sigmoid colectomy with reanastamosis on 05/19/15 for a mucinous cystadenoma with no borderline change or malignancy.    Wound cellulitis and drainage and dehiscence on exam. New development of collection of purulence at inferior wound - opened and drained Continue cipro. Continue BID dressing changes with iodoform tape. Patient notified to return if she develops fevers or increased drainage from incision or if there is increasing redness or drainage from the lower part of the incision which may also need to be opened.   HPI:  Diane [name] is a 59 year old, gravida 0,  seen in consultation at the request of Dr. Charlesetta Garibaldi for a 25 cm mostly cystic pelvic mass arising from likely the left ovary. She had symptoms of intermittent nausea for the past month and noted distention of the abdomen. She reported this to her provider who scheduled her for a CT scan of the abdomen and pelvis with contrast, which was performed on 03/28/2015. It revealed mild left hydronephrosis associated with obstruction from a 24 x 25 x 16 cm cystic mass in the central abdomen and pelvis with multiple internal septations. The mass was asymmetric towards the left anterior abdominal wall. There was no ascites. There was no lymphadenopathy. There is no omental cake or carcinomatosis.  Follow-up ultrasound scan was performed by Dr. Charlesetta Garibaldi on 03/30/2015, which revealed a small normal uterus measuring 5.4 x 4.1 x 2.9 cm with a thin endometrial stripe of 1.3 mm. A large cystic mass was also confirmed arising from the pelvis.  Medical history/issues include MI in 2007, CABG, coronary stent, tobacco abuse, cerebrovascular disease, obstructive sleep apnea, diabetes, and substantial surgical  history.  She was seen by Dr. Peter Martinique prior to surgery with no interventions recommended to improve cardiac status and with the patient being viewed as high surgical risk.  She recently had a breast biopsy on 05/17/15 that resulted benign breast tissue with calcifications.  On 05/19/15, she underwent an exploratory laparotomy with bilateral salpingo-oophorectomy, omentectomy sigmoid colectomy with reanastamosis by Dr. Everitt Amber.  Final pathology revealed: 1. Ovary and fallopian tube, left - MUCINOUS CYSTADENOMA, MULTILOCULAR. - NO BORDERLINE CHANGE OR MALIGNANCY. - BENIGN FALLOPIAN TUBE.  2. Ovary and fallopian tube, right - UNREMARKABLE OVARY AND FALLOPIAN TUBE. - NO ENDOMETRIOSIS OR EVIDENCE OF MALIGNANCY. 3. Omentum, resection for tumor - BENIGN ADIPOSE TISSUE CONSISTENT WITH OMENTUM.  4. Colon, segmental resection, sigmoid - BENIGN COLON, CLINICALLY SIGMOID. - NO EVIDENCE OF MALIGNANCY.  Her post-operative course was uneventful.    She developed upper abdominal wound cellulitis and opening which required iodoform tape packing of a long narrow tunnel (heads superiorally).   Interval History:  She has been on keflex, then changed to cipro (based on wound culture sensitiviies). In the past week she has developed increasing pain in the inferior aspect of the wound. The upper wound has been accomodating less packing material.  Review of Systems  Constitutional: Feels well.  No fever, chills, early satiety, unintentional weight loss or gain.  Cardiovascular: No chest pain, shortness of breath, or edema.  Pulmonary: No cough or wheeze.  Gastrointestinal: No nausea, vomiting, or diarrhea. No bright red blood per rectum or change in bowel movement.  Genitourinary: No frequency, urgency, or dysuria. No vaginal bleeding or discharge.  Musculoskeletal: No myalgia  or joint pain. Neurologic: No weakness, numbness, or change in gait.  Psychology: No depression, anxiety, or insomnia.  Current Meds:   Outpatient Encounter Prescriptions as of 06/13/2015  Medication Sig  . albuterol (PROVENTIL HFA;VENTOLIN HFA) 108 (90 BASE) MCG/ACT inhaler Inhale 2 puffs into the lungs every 6 (six) hours as needed for wheezing or shortness of breath.  [name] albuterol (PROVENTIL) (2.5 MG/3ML) 0.083% nebulizer solution USE 1 VIAL PER NEBULIZRE EVERY 6 HOURS AS NEEDED *HOSPITALIST DOESNT DO PA  . aspirin EC 81 MG tablet Take 1 tablet (81 mg total) by mouth daily.  . carvedilol (COREG) 25 MG tablet Take 1 tablet (25 mg total) by mouth 2 (two) times daily with a meal.  . ciprofloxacin (CIPRO) 250 MG tablet Take 1 tablet (250 mg total) by mouth 2 (two) times daily.  . clopidogrel (PLAVIX) 75 MG tablet Take 1 tablet (75 mg total) by mouth daily.  . cyclobenzaprine (FLEXERIL) 10 MG tablet Take 10 mg by mouth at bedtime.   [name] DEXILANT 60 MG capsule TAKE 1 CAPSULE (60 MG TOTAL) BY MOUTH DAILY.  . diphenhydrAMINE (BENADRYL) 25 mg capsule Take 2 capsules in AM and 1 capsule at night  . fluticasone (FLONASE) 50 MCG/ACT nasal spray Place 2 sprays into both nostrils daily.  . furosemide (LASIX) 40 MG tablet Take 1 tablet (40 mg total) by mouth daily.  [name] gabapentin (NEURONTIN) 300 MG capsule TAKE 1 CAPSULE (300 MG TOTAL) BY MOUTH 3 (THREE) TIMES DAILY. (Patient taking differently: takes 2 in the pm)  . glimepiride (AMARYL) 2 MG tablet Take 2 mg by mouth daily as needed (if sugar is above 140 take additional tab at bedtime).   [name] guaiFENesin (MUCINEX) 600 MG 12 hr tablet Take 2 tablets (1,200 mg total) by mouth 2 (two) times daily.  . isosorbide mononitrate (IMDUR) 30 MG 24 hr tablet Take 1 tablet (30 mg total) by mouth 2 (two) times daily.  [name] levETIRAcetam (KEPPRA) 750 MG tablet Take 1 tablet (750 mg total) by mouth 2 (two) times daily.  [name] losartan (COZAAR) 100 MG tablet Take 1 tablet (100 mg total) by mouth daily.  . metFORMIN (GLUCOPHAGE-XR) 500 MG 24 hr tablet Take 1,000 mg by mouth 2 (two) times daily.  [name] oxyCODONE (OXY  IR/ROXICODONE) 5 MG immediate release tablet Take 1 tablet (5 mg total) by mouth every 2 (two) hours as needed for severe pain.  [name] oxyCODONE-acetaminophen (ROXICET) 5-325 MG per tablet Take 1-2 tablets by mouth every 6 (six) hours as needed for severe pain.  . phenytoin (DILANTIN) 100 MG ER capsule Take 100 mg by mouth 2 (two) times daily.  . potassium chloride (K-DUR) 10 MEQ tablet Take 1 tablet (10 mEq total) by mouth 2 (two) times daily.  . Probiotic CAPS Take 1 capsule by mouth 2 (two) times daily.  . ranolazine (RANEXA) 500 MG 12 hr tablet Take 1 tablet (500 mg total) by mouth 2 (two) times daily.  . rosuvastatin (CRESTOR) 10 MG tablet Take 1 tablet (10 mg total) by mouth daily.  . VOLTAREN 1 % GEL Apply 2 g topically as needed (pain).   . [DISCONTINUED] oxyCODONE-acetaminophen (ROXICET) 5-325 MG per tablet Take 1-2 tablets by mouth every 6 (six) hours as needed for severe pain.  . [DISCONTINUED] oxyCODONE-acetaminophen (ROXICET) 5-325 MG per tablet Take 1-2 tablets by mouth every 6 (six) hours as needed for severe pain.  . hydrochlorothiazide (HYDRODIURIL) 50 MG tablet Take 1 tablet (50 mg total) by mouth daily.  . nitroGLYCERIN (  NITROSTAT) 0.4 MG SL tablet Place 1 tablet (0.4 mg total) under the tongue every 5 (five) minutes as needed. (Patient not taking: Reported on 05/27/2015)  . ondansetron (ZOFRAN) 4 MG tablet Take 1 tablet (4 mg total) by mouth every 8 (eight) hours as needed for nausea or vomiting (take 1-2 tablets as needed every 8 hours for nausea). (Patient not taking: Reported on 06/13/2015)  . [DISCONTINUED] cephALEXin (KEFLEX) 500 MG capsule Take 1 capsule (500 mg total) by mouth 4 (four) times daily. (Patient not taking: Reported on 06/13/2015)   No facility-administered encounter medications on file as of 06/13/2015.    Allergy:  Allergies  Allergen Reactions  . Ace Inhibitors     REACTION: cough  . Bee Venom   . Tetracycline     REACTION: reaction not known    Social  Hx:   Social History   Social History  . Marital Status: Divorced    Spouse Name: N/A  . Number of Children: N/A  . Years of Education: N/A   Occupational History  . Not on file.   Social History Main Topics  . Smoking status: Current Every Day Smoker -- 2.00 packs/day for 49 years    Types: Cigarettes  . Smokeless tobacco: Never Used  . Alcohol Use: No  . Drug Use: No  . Sexual Activity:    Partners: Male    Birth Control/ Protection: None   Other Topics Concern  . Not on file   Social History Narrative    Past Surgical Hx:  Past Surgical History  Procedure Laterality Date  . Coronary stent placement  08/11/06    PCI of her ciurcumflex/OM vessel  . Cardiac catheterization  11/29/05    EF of 55%  . Cardiac catheterization  08/06/06    EF of 45-50%  . Coronary artery bypass graft  12/04/2005    x5 -- left internal mammary artery to the LAD, left radial artery to the ramus intermedius, saphenous vein graft to the obtuse marginal 1, sequential saphenous vein grat to the acute marginal and posterior descending, endoscopic vein harvesting from the left thigh with open vein harvest from right leg  . Cholecystectomy    . Appendectomy    . Cervical fusion  1990  . Cardiac catheterization N/A 05/06/2015    Procedure: Right/Left Heart Cath and Coronary/Graft Angiography;  Surgeon: Peter M Martinique, MD;  Location: [facility];  Service: Cardiovascular;  Laterality: N/A;  . Laparotomy Bilateral 05/19/2015    Procedure: EXPLORATORY LAPAROTOMY WITH BILATERAL SALPINGO OOPHORECTOMY /OMENTECTOMY/SEGMENTAL SIGMOID COLECTOMY ;  Surgeon: Everitt Amber, MD;  Location: [facility];  Service: Gynecology;  Laterality: Bilateral;    Past Medical Hx:  Past Medical History  Diagnosis Date  . Coronary artery disease     post CABG in 3/07   . Hypertension   . Hyperlipidemia   . Dyslipidemia   . Diabetes mellitus     type 2  . Chronic bronchitis   . CVA (cerebral infarction) 1993  . Tobacco abuse    . COPD (chronic obstructive pulmonary disease)   . Stroke 1993  . Carotid stenosis   . Systolic murmur     known mild AS and MR  . PONV (postoperative nausea and vomiting)     only once was patient sick in the 1980s   . Myocardial infarction   . CHF (congestive heart failure)   . Sleep apnea     used to wear a cpap- not used in 3 years   .  Shortness of breath dyspnea     with exertion or when fluid builds up   . Pneumonia     hx of   . Goiter   . GERD (gastroesophageal reflux disease)   . Seizures     last seizure- 03/2013   . Headache     hx of   . Fibromyalgia   . Anemia     Family Hx:  Family History  Problem Relation Age of Onset  . Coronary artery disease    . Heart disease    . Heart disease Mother   . Diabetes Mother   . COPD Mother   . Hyperlipidemia Mother   . Hypertension Mother   . Cancer Father   . Drug abuse Paternal Grandmother   . Stroke Paternal Grandfather   . Cancer Paternal Aunt     lung with mets to brain  . Cancer Paternal Uncle     skin cancer  . Cancer Paternal Uncle     lung/liver to brain  . Cancer Paternal Uncle     cancer of unknown type    Vitals:  Blood pressure 114/67, pulse 78, temperature 98.2 F (36.8 C), temperature source Oral, resp. rate 16, height '4\' 10"'$  (1.473 m), weight 124 lb (56.246 kg), last menstrual period 09/02/2003.  Physical Exam:  General: Well developed, well nourished female in no acute distress. Alert and oriented x 3.  Cardiovascular: deferred Lungs: deferred Skin: No rashes or lesions present. Back: deferred Abdomen: Abdomen soft, non-tender and obese.  Mildly distended but soft.  Active bowel sounds in all quadrants. No evidence of a fluid wave or abdominal masses.   Wound effectively healed At most superior aspect of the wound there is a 1cm opening in the skin which tunnels 4cm cephalad and deep.  The inferior aspect of the wound includes a raised 4cm region of wound with underlying fluctuance - see  procedure for I&D  Extremities: No bilateral cyanosis, edema, or clubbing.   Procedure: Patient provided informed consent. Wound prepped with betadine and infiltrated with 1% lidocaine. 4cm incision made along incision at point of inferior fluctuance. Moderate amount of purulent fluid expressed. Wound base explored - intact fascia, fairly superficial wound defect. Packed with iodoform guaze.      Donaciano Eva, NP 06/13/2015, 10:30 PM

## 2015-06-13 NOTE — Patient Instructions (Signed)
Continue with the prescribed Cipro.  Pack the open areas x 2 twice day.  Call if the incision has increased redness, drainage, or does not appear to be healing.  Plan to follow up in one month or sooner if needed.

## 2015-06-14 ENCOUNTER — Other Ambulatory Visit: Payer: Self-pay

## 2015-06-16 ENCOUNTER — Other Ambulatory Visit: Payer: Self-pay | Admitting: *Deleted

## 2015-06-16 MED ORDER — LOSARTAN POTASSIUM 100 MG PO TABS: 100.0000 mg | ORAL_TABLET | Freq: Every day | ORAL | Status: DC

## 2015-06-21 ENCOUNTER — Telehealth: Payer: Self-pay | Admitting: *Deleted

## 2015-06-21 ENCOUNTER — Ambulatory Visit (INDEPENDENT_AMBULATORY_CARE_PROVIDER_SITE_OTHER): Payer: Medicare Other | Admitting: Family Medicine

## 2015-06-21 ENCOUNTER — Encounter: Payer: Self-pay | Admitting: Family Medicine

## 2015-06-21 VITALS — BP 112/58 | HR 84 | Temp 98.0°F | Ht <= 58 in | Wt 125.5 lb

## 2015-06-21 DIAGNOSIS — I10 Essential (primary) hypertension: Secondary | ICD-10-CM | POA: Diagnosis not present

## 2015-06-21 DIAGNOSIS — F17200 Nicotine dependence, unspecified, uncomplicated: Secondary | ICD-10-CM

## 2015-06-21 DIAGNOSIS — Z720 Tobacco use: Secondary | ICD-10-CM | POA: Diagnosis not present

## 2015-06-21 DIAGNOSIS — Z23 Encounter for immunization: Secondary | ICD-10-CM | POA: Diagnosis not present

## 2015-06-21 DIAGNOSIS — Z0000 Encounter for general adult medical examination without abnormal findings: Secondary | ICD-10-CM | POA: Diagnosis not present

## 2015-06-21 DIAGNOSIS — I25708 Atherosclerosis of coronary artery bypass graft(s), unspecified, with other forms of angina pectoris: Secondary | ICD-10-CM

## 2015-06-21 DIAGNOSIS — E785 Hyperlipidemia, unspecified: Secondary | ICD-10-CM

## 2015-06-21 NOTE — Assessment & Plan Note (Signed)
Disc in detail risks of smoking and possible outcomes including copd, vascular/ heart disease, cancer , respiratory and sinus infections  Pt voices understanding Pt is still thinking about quitting  She may try patches if she can get financial help with them

## 2015-06-21 NOTE — Telephone Encounter (Signed)
appt for 9/23 cancelled pt seen 9/12. Called pt lmovm request call back to confirm msg received.

## 2015-06-21 NOTE — Assessment & Plan Note (Signed)
Reviewed health habits including diet and exercise and skin cancer prevention Reviewed appropriate screening tests for age  Also reviewed health mt list, fam hx and immunization status , as well as social and family history   See HPI Labs reviewed from last hosp Lab draw today When you can afford vitamin D - I want you to get 2000 iu daily (get outdoors and eat leafy greens and dairy fortified with D)  Work on an Forensic scientist (the blue handout) Lab today for blood count/chemistry/ and cholesterol  Do get your eyes examined when you can Flu shot today

## 2015-06-21 NOTE — Assessment & Plan Note (Signed)
bp in fair control at this time  BP Readings from Last 1 Encounters:  06/21/15 112/58   No changes needed Disc lifstyle change with low sodium diet and exercise  Labs today

## 2015-06-21 NOTE — Patient Instructions (Signed)
When you can afford vitamin D - I want you to get 2000 iu daily (get outdoors and eat leafy greens and dairy fortified with D)  Work on an Forensic scientist (the blue handout) Lab today for blood count/chemistry/ and cholesterol  Do get your eyes examined when you can Flu shot today

## 2015-06-21 NOTE — Progress Notes (Signed)
Pre visit review using our clinic review tool, if applicable. No additional management support is needed unless otherwise documented below in the visit note. 

## 2015-06-21 NOTE — Progress Notes (Signed)
Subjective:    Patient ID: Diane [name], female    DOB: 17-May-1956, 59 y.o.   MRN: [id number]  HPI Here for annual medicare wellness visit as well as chronic/acute medical problems   I have personally reviewed the Medicare Annual Wellness questionnaire and have noted 1. The patient's medical and social history 2. Their use of alcohol, tobacco or illicit drugs 3. Their current medications and supplements 4. The patient's functional ability including ADL's, fall risks, home safety risks and hearing or visual             impairment. 5. Diet and physical activities 6. Evidence for depression or mood disorders  The patients weight, height, BMI have been recorded in the chart and visual acuity is per eye clinic.  I have made referrals, counseling and provided education to the patient based review of the above and I have provided the pt with a written personalized care plan for preventive services. Reviewed and updated provider list, see scanned forms.  See scanned forms.  Routine anticipatory guidance given to patient.  See health maintenance. Colon cancer screening-just had a partial colectomy with her last surgery for abd mass (no cancer) Breast cancer screening- last mam prompted bx (neg)-in Aug  Self breast exam-normal (always feel lumpy in general-no change) Flu vaccine - will get that today  Tetanus vaccine 2/11 Pneumovax- up to date on both Zoster vaccine- was given -has not done yet (waiting for abd to heal up) 10/14 - very mild osteopenia dexa (Dr Loretta Plume follows in light of dilantin) - she cannot afford ca and D ,no falls or fractures Advance directive- has POA (sister)- unsure if she has a living will  Cognitive function addressed- see scanned forms- and if abnormal then additional documentation follows.  Doing ok given everything that has been going on   PMH and SH reviewed  Meds, vitals, and allergies reviewed.   ROS: See HPI.  Otherwise negative.    Still healing  from her abdominal surgery- benign cytadenoma removed  Wound is healing by 2ndary intention  Pain med every 12 hours - she is able to pack her own wound - getting better  Was infected -had 3 rounds ot antibiotics and has surg f/u in oct  Took part of her colon-she did well with that      Chemistry      Component Value Date/Time   NA 130* 05/22/2015 0759   K 3.4* 05/22/2015 0759   CL 93* 05/22/2015 0759   CO2 27 05/22/2015 0759   BUN 14 05/22/2015 0759   CREATININE 0.60 05/22/2015 0759   CREATININE 0.85 05/02/2015 1049      Component Value Date/Time   CALCIUM 8.6* 05/22/2015 0759   ALKPHOS 43 05/19/2015 2125   AST 38 05/19/2015 2125   ALT 19 05/19/2015 2125   BILITOT 0.6 05/19/2015 2125      Smoking  Interested in nicotine patches and or gum- [facility] is supposed to follow up on that she thinks    Diabetes is doing well -still sees Dr Chalmers Cater Has lost wt Off insulin  No more low sugars   Lab Results  Component Value Date   WBC 9.4 05/22/2015   HGB 9.6* 05/22/2015   HCT 30.3* 05/22/2015   MCV 76.1* 05/22/2015   PLT 234 05/22/2015   she was not put on iron - eating a balanced diet   Lab Results  Component Value Date   TSH 1.81 06/09/2014   not on any thyroid medicine  currently  Dr Chalmers Cater follows this -pt states recently it was ok   No more seizures   Lab Results  Component Value Date   CHOL 152 06/09/2014   HDL 48.40 06/09/2014   LDLCALC 79 06/09/2014   LDLDIRECT 98.0 06/09/2013   TRIG 123.0 06/09/2014   CHOLHDL 3 06/09/2014     Review of Systems    Review of Systems  Constitutional: Negative for fever, appetite change, fatigue and unexpected weight change.  Eyes: Negative for pain and visual disturbance.  Respiratory: Negative for cough and shortness of breath.   Cardiovascular: Negative for cp or palpitations    Gastrointestinal: Negative for nausea, diarrhea and constipation. pos for incisional pain that  Is improving  Genitourinary: Negative for urgency  and frequency.  Skin: Negative for pallor or rash   Neurological: Negative for weakness, light-headedness, numbness and headaches.  Hematological: Negative for adenopathy. Does not bruise/bleed easily.  Psychiatric/Behavioral: Negative for dysphoric mood. The patient is not nervous/anxious.      Objective:   Physical Exam  Constitutional: She appears well-developed and well-nourished. No distress.  Petite, well appearing   HENT:  Head: Normocephalic and atraumatic.  Right Ear: External ear normal.  Left Ear: External ear normal.  Mouth/Throat: Oropharynx is clear and moist.  Eyes: Conjunctivae and EOM are normal. Pupils are equal, round, and reactive to light. No scleral icterus.  Neck: Normal range of motion. Neck supple. No JVD present. Carotid bruit is not present. No thyromegaly present.  Cardiovascular: Normal rate, regular rhythm, normal heart sounds and intact distal pulses.  Exam reveals no gallop.   Pulmonary/Chest: Effort normal and breath sounds normal. No respiratory distress. She has no wheezes. She exhibits no tenderness.  Diffusely distant bs   Abdominal: Soft. Bowel sounds are normal. She exhibits no distension, no abdominal bruit and no mass. There is no tenderness.  abd incision -still open at top and bottom but healing well  No s/s of infection  Genitourinary: No breast swelling, tenderness, discharge or bleeding.  Breast exam: No mass, nodules, thickening, tenderness, bulging, retraction, inflamation, nipple discharge or skin changes noted.  No axillary or clavicular LA.      Musculoskeletal: Normal range of motion. She exhibits no edema or tenderness.  Lymphadenopathy:    She has no cervical adenopathy.  Neurological: She is alert. She has normal reflexes. No cranial nerve deficit. She exhibits normal muscle tone. Coordination normal.  Skin: Skin is warm and dry. No rash noted. No erythema. No pallor.  Psychiatric: She has a normal mood and affect.  Nursing note  and vitals reviewed.         Assessment & Plan:   Problem List Items Addressed This Visit      Cardiovascular and Mediastinum   Essential hypertension - Primary    bp in fair control at this time  BP Readings from Last 1 Encounters:  06/21/15 112/58   No changes needed Disc lifstyle change with low sodium diet and exercise  Labs today       Relevant Orders   Comprehensive metabolic panel   CBC with Differential/Platelet   Lipid panel     Other   Encounter for Medicare annual wellness exam    Reviewed health habits including diet and exercise and skin cancer prevention Reviewed appropriate screening tests for age  Also reviewed health mt list, fam hx and immunization status , as well as social and family history   See HPI Labs reviewed from last hosp Lab draw today  When you can afford vitamin D - I want you to get 2000 iu daily (get outdoors and eat leafy greens and dairy fortified with D)  Work on an Forensic scientist (the blue handout) Lab today for blood count/chemistry/ and cholesterol  Do get your eyes examined when you can Flu shot today       Hyperlipidemia    Lipid panel today Pt has hx of multi vessel atherosclerosis  On crestor and diet  Enc exercise when released to do so      Relevant Orders   Lipid panel   TOBACCO USE    Disc in detail risks of smoking and possible outcomes including copd, vascular/ heart disease, cancer , respiratory and sinus infections  Pt voices understanding Pt is still thinking about quitting  She may try patches if she can get financial help with them       Other Visit Diagnoses    Need for influenza vaccination        Relevant Orders    Flu Vaccine QUAD 36+ mos PF IM (Fluarix & Fluzone Quad PF) (Completed)

## 2015-06-21 NOTE — Assessment & Plan Note (Signed)
Lipid panel today Pt has hx of multi vessel atherosclerosis  On crestor and diet  Enc exercise when released to do so

## 2015-06-22 LAB — COMPREHENSIVE METABOLIC PANEL
ALT: 12 U/L (ref 0–35)
AST: 15 U/L (ref 0–37)
Albumin: 3.8 g/dL (ref 3.5–5.2)
Alkaline Phosphatase: 78 U/L (ref 39–117)
BUN: 15 mg/dL (ref 6–23)
CO2: 33 mEq/L — ABNORMAL HIGH (ref 19–32)
Calcium: 9.4 mg/dL (ref 8.4–10.5)
Chloride: 93 mEq/L — ABNORMAL LOW (ref 96–112)
Creatinine, Ser: 0.59 mg/dL (ref 0.40–1.20)
GFR: 110.03 mL/min (ref >60.00)
Glucose, Bld: 116 mg/dL — ABNORMAL HIGH (ref 70–99)
Potassium: 3.9 mEq/L (ref 3.5–5.1)
Sodium: 133 mEq/L — ABNORMAL LOW (ref 135–145)
Total Bilirubin: 0.3 mg/dL (ref 0.2–1.2)
Total Protein: 6.9 g/dL (ref 6.0–8.3)

## 2015-06-22 LAB — CBC WITH DIFFERENTIAL/PLATELET
Basophils Absolute: 0 10*3/uL (ref 0.0–0.1)
Basophils Relative: 0.4 % (ref 0.0–3.0)
Eosinophils Absolute: 0.1 10*3/uL (ref 0.0–0.7)
Eosinophils Relative: 1.7 % (ref 0.0–5.0)
HCT: 33 % — ABNORMAL LOW (ref 36.0–46.0)
Hemoglobin: 10.3 g/dL — ABNORMAL LOW (ref 12.0–15.0)
Lymphocytes Relative: 24.1 % (ref 12.0–46.0)
Lymphs Abs: 1.9 10*3/uL (ref 0.7–4.0)
MCHC: 31.3 g/dL (ref 30.0–36.0)
MCV: 74.6 fl — ABNORMAL LOW (ref 78.0–100.0)
Monocytes Absolute: 0.9 10*3/uL (ref 0.1–1.0)
Monocytes Relative: 10.9 % (ref 3.0–12.0)
Neutro Abs: 5 10*3/uL (ref 1.4–7.7)
Neutrophils Relative %: 62.9 % (ref 43.0–77.0)
Platelets: 287 10*3/uL (ref 150.0–400.0)
RBC: 4.43 Mil/uL (ref 3.87–5.11)
RDW: 18.9 % — ABNORMAL HIGH (ref 11.5–15.5)
WBC: 7.9 10*3/uL (ref 4.0–10.5)

## 2015-06-22 LAB — LIPID PANEL
Cholesterol: 130 mg/dL (ref 0–200)
HDL: 52.5 mg/dL (ref >39.00)
LDL Cholesterol: 43 mg/dL (ref 0–99)
NonHDL: 77.3
Total CHOL/HDL Ratio: 2
Triglycerides: 172 mg/dL — ABNORMAL HIGH (ref 0.0–149.0)
VLDL: 34.4 mg/dL (ref 0.0–40.0)

## 2015-06-23 ENCOUNTER — Telehealth: Payer: Self-pay | Admitting: *Deleted

## 2015-06-23 ENCOUNTER — Ambulatory Visit: Payer: Medicare Other | Attending: Gynecologic Oncology | Admitting: Gynecologic Oncology

## 2015-06-23 VITALS — BP 124/64 | HR 87 | Temp 97.9°F | Resp 16 | Ht <= 58 in | Wt 124.4 lb

## 2015-06-23 DIAGNOSIS — T814XXD Infection following a procedure, subsequent encounter: Secondary | ICD-10-CM | POA: Insufficient documentation

## 2015-06-23 DIAGNOSIS — G8918 Other acute postprocedural pain: Secondary | ICD-10-CM | POA: Diagnosis not present

## 2015-06-23 DIAGNOSIS — IMO0001 Reserved for inherently not codable concepts without codable children: Secondary | ICD-10-CM

## 2015-06-23 MED ORDER — OXYCODONE-ACETAMINOPHEN 5-325 MG PO TABS: 1.0000 | ORAL_TABLET | Freq: Four times a day (QID) | ORAL | Status: DC | PRN

## 2015-06-23 MED ORDER — OXYCODONE HCL 5 MG PO TABS: 5.0000 mg | ORAL_TABLET | ORAL | Status: DC | PRN

## 2015-06-23 NOTE — Patient Instructions (Signed)
Continue wound care.  Your incision is healing well.  Please call if you feel the incision is not healing, fever develops, etc.

## 2015-06-23 NOTE — Telephone Encounter (Signed)
Received call from patient stating that she is still having pus drainage from the top abdominal incision. She also states she will be out of her pain medication tomorrow. Discussed with Joylene John, NP and patient scheduled to come into the office today at 12pm. Patient agreeable to appt,

## 2015-06-24 ENCOUNTER — Ambulatory Visit: Payer: Self-pay | Admitting: Gynecologic Oncology

## 2015-06-28 ENCOUNTER — Other Ambulatory Visit: Payer: Self-pay

## 2015-06-28 DIAGNOSIS — I639 Cerebral infarction, unspecified: Secondary | ICD-10-CM

## 2015-06-28 NOTE — Progress Notes (Signed)
Follow Up Note: Gyn-Onc  Diane [name] 59 y.o. female  CC:  Chief Complaint  Patient presents with  . Wound Check    Follow up    HPI:  Diane [name] is a 59 year old, gravida 0,  seen in consultation at the request of Dr. Charlesetta Garibaldi for a 25 cm mostly cystic pelvic mass arising from likely the left ovary. She had symptoms of intermittent nausea for the past month and noted distention of the abdomen. She reported this to her provider who scheduled her for a CT scan of the abdomen and pelvis with contrast, which was performed on 03/28/2015. It revealed mild left hydronephrosis associated with obstruction from a 24 x 25 x 16 cm cystic mass in the central abdomen and pelvis with multiple internal septations. The mass was asymmetric towards the left anterior abdominal wall. There was no ascites. There was no lymphadenopathy. There is no omental cake or carcinomatosis.  Follow-up ultrasound scan was performed by Dr. Charlesetta Garibaldi on 03/30/2015, which revealed a small normal uterus measuring 5.4 x 4.1 x 2.9 cm with a thin endometrial stripe of 1.3 mm. A large cystic mass was also confirmed arising from the pelvis.  Medical history/issues include MI in 2007, CABG, coronary stent, tobacco abuse, cerebrovascular disease, obstructive sleep apnea, diabetes, and substantial surgical history.  She was seen by Dr. Peter Martinique prior to surgery with no interventions recommended to improve cardiac status and with the patient being viewed as high surgical risk.  She recently had a breast biopsy on 05/17/15 that resulted benign breast tissue with calcifications.  On 05/19/15, she underwent an exploratory laparotomy with bilateral salpingo-oophorectomy, omentectomy sigmoid colectomy with reanastamosis by Dr. Everitt Amber.  Final pathology revealed: 1. Ovary and fallopian tube, left - MUCINOUS CYSTADENOMA, MULTILOCULAR. - NO BORDERLINE CHANGE OR MALIGNANCY. - BENIGN FALLOPIAN TUBE.  2. Ovary and fallopian tube, right - UNREMARKABLE  OVARY AND FALLOPIAN TUBE. - NO ENDOMETRIOSIS OR EVIDENCE OF MALIGNANCY. 3. Omentum, resection for tumor - BENIGN ADIPOSE TISSUE CONSISTENT WITH OMENTUM.  4. Colon, segmental resection, sigmoid - BENIGN COLON, CLINICALLY SIGMOID. - NO EVIDENCE OF MALIGNANCY.  Her post-operative course was uneventful.    Interval History:  She presents today alone for wound assessment.  She called the office this am with concerns that pus was still draining from upper opening on her incision.  She continues to pack the two openings on her incision twice daily.  Tolerating diet with no nausea or emesis reported.  Ambulating without difficulty.  Pain controlled with percocet use but stating she needs a refill.  Erythema on her incision has been improving.  Denies vaginal or rectal bleeding.   Review of Systems  Constitutional: Feels well.  No fever, chills, early satiety, unintentional weight loss or gain.  Cardiovascular: No chest pain, shortness of breath, or edema.  Pulmonary: No cough or wheeze.  Gastrointestinal: No nausea, vomiting, or diarrhea. No bright red blood per rectum or change in bowel movement.  Genitourinary: No frequency, urgency, or dysuria. No vaginal bleeding or discharge.  Musculoskeletal: No myalgia or joint pain. Neurologic: No weakness, numbness, or change in gait.  Psychology: No depression, anxiety, or insomnia.  Current Meds:  Outpatient Encounter Prescriptions as of 06/23/2015  Medication Sig  . albuterol (PROVENTIL HFA;VENTOLIN HFA) 108 (90 BASE) MCG/ACT inhaler Inhale 2 puffs into the lungs every 6 (six) hours as needed for wheezing or shortness of breath.  [name] albuterol (PROVENTIL) (2.5 MG/3ML) 0.083% nebulizer solution USE 1 VIAL PER NEBULIZRE EVERY  6 HOURS AS NEEDED *HOSPITALIST DOESNT DO PA  . aspirin EC 81 MG tablet Take 1 tablet (81 mg total) by mouth daily.  . carvedilol (COREG) 25 MG tablet Take 1 tablet (25 mg total) by mouth 2 (two) times daily with a meal.  . clopidogrel  (PLAVIX) 75 MG tablet Take 1 tablet (75 mg total) by mouth daily.  . cyclobenzaprine (FLEXERIL) 10 MG tablet Take 10 mg by mouth at bedtime.   [name] DEXILANT 60 MG capsule TAKE 1 CAPSULE (60 MG TOTAL) BY MOUTH DAILY.  . diphenhydrAMINE (BENADRYL) 25 mg capsule Take 2 capsules in AM and 1 capsule at night  . fluticasone (FLONASE) 50 MCG/ACT nasal spray Place 2 sprays into both nostrils daily.  . furosemide (LASIX) 40 MG tablet Take 1 tablet (40 mg total) by mouth daily.  [name] gabapentin (NEURONTIN) 300 MG capsule TAKE 1 CAPSULE (300 MG TOTAL) BY MOUTH 3 (THREE) TIMES DAILY. (Patient taking differently: takes 2 in the pm)  . glimepiride (AMARYL) 2 MG tablet Take 2 mg by mouth daily as needed (if sugar is above 140 take additional tab at bedtime).   [name] guaiFENesin (MUCINEX) 600 MG 12 hr tablet Take 2 tablets (1,200 mg total) by mouth 2 (two) times daily.  . hydrochlorothiazide (HYDRODIURIL) 50 MG tablet Take 1 tablet (50 mg total) by mouth daily.  . isosorbide mononitrate (IMDUR) 30 MG 24 hr tablet Take 1 tablet (30 mg total) by mouth 2 (two) times daily.  [name] levETIRAcetam (KEPPRA) 750 MG tablet Take 1 tablet (750 mg total) by mouth 2 (two) times daily.  [name] losartan (COZAAR) 100 MG tablet Take 1 tablet (100 mg total) by mouth daily.  . metFORMIN (GLUCOPHAGE-XR) 500 MG 24 hr tablet Take 1,000 mg by mouth 2 (two) times daily.  . nitroGLYCERIN (NITROSTAT) 0.4 MG SL tablet Place 1 tablet (0.4 mg total) under the tongue every 5 (five) minutes as needed.  . ondansetron (ZOFRAN) 4 MG tablet Take 1 tablet (4 mg total) by mouth every 8 (eight) hours as needed for nausea or vomiting (take 1-2 tablets as needed every 8 hours for nausea).  [name] oxyCODONE (OXY IR/ROXICODONE) 5 MG immediate release tablet Take 1 tablet (5 mg total) by mouth every 2 (two) hours as needed for severe pain.  [name] oxyCODONE-acetaminophen (ROXICET) 5-325 MG per tablet Take 1-2 tablets by mouth every 6 (six) hours as needed for severe pain.  . phenytoin  (DILANTIN) 100 MG ER capsule Take 100 mg by mouth 2 (two) times daily.  . potassium chloride (K-DUR) 10 MEQ tablet Take 1 tablet (10 mEq total) by mouth 2 (two) times daily.  . Probiotic CAPS Take 1 capsule by mouth 2 (two) times daily.  . ranolazine (RANEXA) 500 MG 12 hr tablet Take 1 tablet (500 mg total) by mouth 2 (two) times daily.  . rosuvastatin (CRESTOR) 10 MG tablet Take 1 tablet (10 mg total) by mouth daily.  . VOLTAREN 1 % GEL Apply 2 g topically as needed (pain).   . [DISCONTINUED] oxyCODONE (OXY IR/ROXICODONE) 5 MG immediate release tablet Take 1 tablet (5 mg total) by mouth every 2 (two) hours as needed for severe pain.  . [DISCONTINUED] oxyCODONE-acetaminophen (ROXICET) 5-325 MG per tablet Take 1-2 tablets by mouth every 6 (six) hours as needed for severe pain.   No facility-administered encounter medications on file as of 06/23/2015.    Allergy:  Allergies  Allergen Reactions  . Ace Inhibitors     REACTION: cough  . Bee Venom   .  Tetracycline     REACTION: reaction not known    Social Hx:   Social History   Social History  . Marital Status: Divorced    Spouse Name: N/A  . Number of Children: N/A  . Years of Education: N/A   Occupational History  . Not on file.   Social History Main Topics  . Smoking status: Current Every Day Smoker -- 2.00 packs/day for 49 years    Types: Cigarettes  . Smokeless tobacco: Never Used     Comment: 1.5 pack to 2 packs per day  . Alcohol Use: No  . Drug Use: No  . Sexual Activity:    Partners: Male    Birth Control/ Protection: None   Other Topics Concern  . Not on file   Social History Narrative    Past Surgical Hx:  Past Surgical History  Procedure Laterality Date  . Coronary stent placement  08/11/06    PCI of her ciurcumflex/OM vessel  . Cardiac catheterization  11/29/05    EF of 55%  . Cardiac catheterization  08/06/06    EF of 45-50%  . Coronary artery bypass graft  12/04/2005    x5 -- left internal mammary  artery to the LAD, left radial artery to the ramus intermedius, saphenous vein graft to the obtuse marginal 1, sequential saphenous vein grat to the acute marginal and posterior descending, endoscopic vein harvesting from the left thigh with open vein harvest from right leg  . Cholecystectomy    . Appendectomy    . Cervical fusion  1990  . Cardiac catheterization N/A 05/06/2015    Procedure: Right/Left Heart Cath and Coronary/Graft Angiography;  Surgeon: Peter M Martinique, MD;  Location: [facility];  Service: Cardiovascular;  Laterality: N/A;  . Laparotomy Bilateral 05/19/2015    Procedure: EXPLORATORY LAPAROTOMY WITH BILATERAL SALPINGO OOPHORECTOMY /OMENTECTOMY/SEGMENTAL SIGMOID COLECTOMY ;  Surgeon: Everitt Amber, MD;  Location: [facility];  Service: Gynecology;  Laterality: Bilateral;    Past Medical Hx:  Past Medical History  Diagnosis Date  . Coronary artery disease     post CABG in 3/07   . Hypertension   . Hyperlipidemia   . Dyslipidemia   . Diabetes mellitus     type 2  . Chronic bronchitis   . CVA (cerebral infarction) 1993  . Tobacco abuse   . COPD (chronic obstructive pulmonary disease)   . Stroke 1993  . Carotid stenosis   . Systolic murmur     known mild AS and MR  . PONV (postoperative nausea and vomiting)     only once was patient sick in the 1980s   . Myocardial infarction   . CHF (congestive heart failure)   . Sleep apnea     used to wear a cpap- not used in 3 years   . Shortness of breath dyspnea     with exertion or when fluid builds up   . Pneumonia     hx of   . Goiter   . GERD (gastroesophageal reflux disease)   . Seizures     last seizure- 03/2013   . Headache     hx of   . Fibromyalgia   . Anemia     Family Hx:  Family History  Problem Relation Age of Onset  . Coronary artery disease    . Heart disease    . Heart disease Mother   . Diabetes Mother   . COPD Mother   . Hyperlipidemia Mother   .  Hypertension Mother   . Cancer Father   . Drug  abuse Paternal Grandmother   . Stroke Paternal Grandfather   . Cancer Paternal Aunt     lung with mets to brain  . Cancer Paternal Uncle     skin cancer  . Cancer Paternal Uncle     lung/liver to brain  . Cancer Paternal Uncle     cancer of unknown type    Vitals:  Blood pressure 124/64, pulse 87, temperature 97.9 F (36.6 C), temperature source Oral, resp. rate 16, height 4' 9.5" (1.461 m), weight 124 lb 6.4 oz (56.427 kg), last menstrual period 09/02/2003.  Physical Exam:  General: Well developed, well nourished female in no acute distress. Alert and oriented x 3.  Cardiovascular: Regular rate and rhythm. S1 and S2 normal.  Lungs: Clear to auscultation bilaterally. No wheezes/crackles/rhonchi noted.  Skin: No rashes or lesions present. Back: No CVA tenderness.  Abdomen: Abdomen soft, non-tender and obese.  Mildly distended but soft.  Active bowel sounds in all quadrants. No evidence of a fluid wave or abdominal masses.  1/2 inch packing strip removed from the opening in the upper incision.  No pus present. Minimal amount of clear drainage.  No odor.  Wound clean.  No evidence of infection.  Dressing removed from the lower portion of the incision.  Wound shallow- no packing necessary but moistened gauze placed on the wound bed.  Lower opening without drainage.  Wound bed red with no evidence of infection.  Extremities: No bilateral cyanosis, edema, or clubbing.   Assessment/Plan:  59 year old female s/p exploratory laparotomy with bilateral salpingo-oophorectomy, omentectomy sigmoid colectomy with reanastamosis by Dr. Everitt Amber on 05/19/15 for a mucinous cystadenoma with no borderline change or malignancy.  She is doing well post-operatively.  She is advised to continue packing the opening on the upper portion of her incision and to lay a saline moistened gauze in the wound bed on the lower incision.  Reportable signs and symptoms reviewed.  Refill on percocet given.  She is to follow up as  scheduled or sooner if needed.  She is advised to call for any questions or concerns or if the incisional erythema/firmness/warmth increases or persists.  Post-operative instructions reinforced.   CROSS, MELISSA DEAL, NP 06/28/2015, 4:24 PM

## 2015-06-28 NOTE — Patient Outreach (Signed)
  Patient and this RNCM discussed patient's community care coordination needs at this time after patient provided information to satisfy HIPPA identifiers.  Patient and this RNCM collaborated to create care plan long and short term goals. Patient has identified smoking cessation as priority of needs at this time.  [facility] referral made to assist with smoking cessation Patient has expressed being unable to afford some over the counter medications Vitamin B, Calcium and Mucinex..  Home visit schedule for tomorrow to further assess needs for community care coordination.

## 2015-06-28 NOTE — Patient Outreach (Signed)
[facility]) Care Management  06/28/2015  Diane [name] April 15, 1956 [id number]   Request from Erenest Rasher, RN to assign Pharmacy, assigned Deanne Coffer, PharmD.  Thanks, [name]. [place], [place] Assistant Phone: [phone] Fax: [phone]

## 2015-06-29 ENCOUNTER — Other Ambulatory Visit: Payer: Self-pay

## 2015-06-29 ENCOUNTER — Other Ambulatory Visit: Payer: Self-pay | Admitting: *Deleted

## 2015-06-29 DIAGNOSIS — I25709 Atherosclerosis of coronary artery bypass graft(s), unspecified, with unspecified angina pectoris: Secondary | ICD-10-CM

## 2015-06-29 MED ORDER — ROSUVASTATIN CALCIUM 10 MG PO TABS: 10.0000 mg | ORAL_TABLET | Freq: Every day | ORAL | Status: DC

## 2015-06-29 NOTE — Patient Outreach (Addendum)
[facility]) Care Management  06/29/2015  Diane [name] March 18, 1956 [id number]   Initial home visit for assessment for community care coordination. Patient and this RNCM reviewed [facility] Communication Packet, referral process and benefits of [facility]. Patient was agreeable to assessment and readily responded to assessment questions.    Patient offered the following information: Lives in rented home which is owned by her brother in [place].  Her rent is about 400 per month.  Her income from social security is $816 per month.  She also gets $50 per month alimony, however, these payments are in the rear 3 months.    Patient has diagnosis of CHF, states she and her primary care physician have come up with the following to assist with monitoring for exacerbations: Patient weighs daily, mornings after voiding and recording.   Patient's plan is to call primary care physician if her weight increases 3 pounds 2-3 days.  Patient's reports weight of 120 pounds this morning, 121 pounds yesterday and 120 the day before.    Patient advised this RNCM she plans to visit a senior center in [place] on tomorrow.  Patient states [facility] offers lunch for $1.00, door prizes are given on Friday and there are social outings.  Patient unable to recall the name of the center, is going with a friend who is already participating at the center  Neurologist has scheduled a Bone Scan due to patient being on Dilantin which can cause loss of calcium.  Patient states she is awaiting date.    Patient has attended Diabetes Classes at [facility] and [place] and feels her blood sugar is well controlled.  Gait unsteady-uses mothers walker for gait stability as needed.  Vision: patient has not had vision exam in many years.  LCSW referral made to assist with finding community resource to assist with low income eye care.    Limited Income Month patient receives Medicaid Gets $50 per month  for alimony Needs assistance with utilities during winter months  Plan: Home visit in 3 weeks to follow up with community referrals

## 2015-06-29 NOTE — Patient Outreach (Signed)
[facility]) Care Management  06/29/2015  Diane [name] April 08, 1956 [id number]   Request from Erenest Rasher, RN to assign SW, assigned Eula Fried, LCSW.  Thanks, [name]. [place], [place] Assistant Phone: [phone] Fax: [phone]

## 2015-07-01 ENCOUNTER — Other Ambulatory Visit: Payer: Self-pay | Admitting: Pharmacist

## 2015-07-01 ENCOUNTER — Other Ambulatory Visit: Payer: Self-pay | Admitting: Licensed Clinical Social Worker

## 2015-07-01 NOTE — Patient Outreach (Signed)
[facility]) Care Management  07/01/2015  Diane [name] 12/29/1955 [id number]   Assessment-CSW received referral from [place] due to patient being in need of new eyeglasses and wanting food bank/food pantry information. CSW made outreach to patient, HIPPA verifications were received. Patient reports needing affordable eyeglasses. CSW kept patient on the phone while making a referral to [employer]. CSW informed her that the waiting list is longer than usual and that she will receive a call in November or December from the [employer] to set up her free eye exam in order to gain eye glasses. Patient was grateful for referral and is willing to be on waiting list. Patient reports that her mother passed away last June 27, 2023 which has put a strain on her finances. Patient also reports that she receives $50.00 in [place] but that she has not been paid this in 3 months and that a lawyer is now involved. Patient is wanting information on food banks and food pantries in the area as she goes travels to a center in [place] at [place] with a friend some days to get lunch for $1.00 and to participate in outings and socialization. CSW informed her that information on food pantries and food banks will be mailed to her residence soon.  CSW also informed patient that she will be sending a list of community agencies and resources that assist with finances. Patient denies any other social work needs a this time as she is now on waiting list for free eye exam and glasses and will be receiving all local resources to help with food and financial assistance.  Plan-CSW will send message in basket to mail financial assistance resource list and food bank/food pantry books to patient. CSW will discharge patient from caseload and inform RNCM.  Marland KitchenEula Fried, BSW, MSW, [email or web address] Phone: [phone] Fax: [phone]

## 2015-07-01 NOTE — Patient Outreach (Signed)
[facility]) Care Management  07/01/2015  Gentri Guardado Kinsella [birth year] [id number]   Diane [name] is a 59yo referred to [place] from [place], Loni Muse, for tobacco cessation.  I made initial outreach call to patient.  Patient reports she is still interested in quitting.  I set up an initial home visit for Monday, October 3rd at [place], Pharm.D. Pharmacy Resident [place]

## 2015-07-03 ENCOUNTER — Other Ambulatory Visit: Payer: Self-pay | Admitting: Neurology

## 2015-07-04 ENCOUNTER — Encounter: Payer: Self-pay | Admitting: Pharmacist

## 2015-07-04 ENCOUNTER — Other Ambulatory Visit: Payer: Self-pay | Admitting: Pharmacist

## 2015-07-04 NOTE — Patient Outreach (Signed)
[facility]) Care Management  [place]   07/04/2015  Diane [name] 06-09-1956 [id number]  Subjective: Diane [name] is a 59 y.o. female who was referred to the [facility] for smoking cessation and information on over the counter medication give away.   Age when started using tobacco on a daily basis 69-32 years old. Number of Cigarettes per day:  1.5 to 2 packs daily (30 to 40 cigarettes) Brand smoked [place] cigars.  Smokes first cigarette within 5 minutes after waking. Smokes about every 2 hours during the night when she wakes to go to the bathroom.    Estimated Fagerstrom Score 9/10.  Most recent successful quit attempt in 1993 after she had a stroke, has had several quit attempts since then but has not been successful.   Longest time ever been tobacco free 2 months.  Medications (NRT, bupropion, varenicline) used in past include: varenicline (with no success), wellbutrin (with no success), nicotine patches and gum (helped more than other medications).   Rates IMPORTANCE of quitting tobacco on 1-10 scale of 10. Rates READINESS of quitting tobacco on 1-10 scale of 7-8. Rates CONFIDENCE of quitting tobacco on 1-10 scale of 6-7. Triggers to use tobacco include stress, eating, waking up, before going to bed.    Patient reports hardest cigarette to give up is first one of the day.    Patient reports her biggest motivation to quit is her health.  She reports having a stroke and bypass in the past and understands the risks of smoking.  She has set a quit date of 08/12/15.    Objective:  Current Medications: Current Outpatient Prescriptions  Medication Sig Dispense Refill  . albuterol (PROVENTIL HFA;VENTOLIN HFA) 108 (90 BASE) MCG/ACT inhaler Inhale 2 puffs into the lungs every 6 (six) hours as needed for wheezing or shortness of breath. 1 Inhaler 5  . aspirin EC 81 MG tablet Take 1 tablet (81 mg total) by mouth daily. 30 tablet 6   . carvedilol (COREG) 25 MG tablet Take 1 tablet (25 mg total) by mouth 2 (two) times daily with a meal. 180 tablet 3  . clopidogrel (PLAVIX) 75 MG tablet Take 1 tablet (75 mg total) by mouth daily. 90 tablet 0  . DEXILANT 60 MG capsule TAKE 1 CAPSULE (60 MG TOTAL) BY MOUTH DAILY. 30 capsule 5  . diphenhydrAMINE (BENADRYL) 25 mg capsule Take 2 capsules in AM and 1 capsule at night 270 capsule 3  . fluticasone (FLONASE) 50 MCG/ACT nasal spray Place 2 sprays into both nostrils daily. 16 g 2  . furosemide (LASIX) 40 MG tablet Take 1 tablet (40 mg total) by mouth daily. 90 tablet 3  . gabapentin (NEURONTIN) 300 MG capsule TAKE 1 CAPSULE (300 MG TOTAL) BY MOUTH 3 (THREE) TIMES DAILY. (Patient taking differently: takes 2 in the pm) 90 capsule 2  . glimepiride (AMARYL) 2 MG tablet Take 2 mg by mouth daily as needed (if sugar is above 140 take additional tab at bedtime).     [name] guaiFENesin (MUCINEX) 600 MG 12 hr tablet Take 2 tablets (1,200 mg total) by mouth 2 (two) times daily. 360 tablet 3  . hydrochlorothiazide (HYDRODIURIL) 50 MG tablet Take 1 tablet (50 mg total) by mouth daily. 30 tablet 2  . isosorbide mononitrate (IMDUR) 30 MG 24 hr tablet Take 1 tablet (30 mg total) by mouth 2 (two) times daily. 180 tablet 3  . levETIRAcetam (KEPPRA) 750 MG tablet TAKE 1 TABLET (750  MG TOTAL) BY MOUTH 2 (TWO) TIMES DAILY. 60 tablet 11  . losartan (COZAAR) 100 MG tablet Take 1 tablet (100 mg total) by mouth daily. 90 tablet 3  . metFORMIN (GLUCOPHAGE-XR) 500 MG 24 hr tablet Take 1,000 mg by mouth 2 (two) times daily.  6  . nitroGLYCERIN (NITROSTAT) 0.4 MG SL tablet Place 1 tablet (0.4 mg total) under the tongue every 5 (five) minutes as needed. 25 tablet 11  . ondansetron (ZOFRAN) 4 MG tablet Take 1 tablet (4 mg total) by mouth every 8 (eight) hours as needed for nausea or vomiting (take 1-2 tablets as needed every 8 hours for nausea). 20 tablet 0  . oxyCODONE (OXY IR/ROXICODONE) 5 MG immediate release tablet Take 1  tablet (5 mg total) by mouth every 2 (two) hours as needed for severe pain. 20 tablet 0  . oxyCODONE-acetaminophen (ROXICET) 5-325 MG per tablet Take 1-2 tablets by mouth every 6 (six) hours as needed for severe pain. 30 tablet 0  . phenytoin (DILANTIN) 100 MG ER capsule TAKE 1 CAPSULE (100 MG TOTAL) BY MOUTH 3 (THREE) TIMES DAILY. (Patient taking differently: 1 BID) 90 capsule 11  . potassium chloride (K-DUR) 10 MEQ tablet Take 1 tablet (10 mEq total) by mouth 2 (two) times daily. 180 tablet 1  . ranolazine (RANEXA) 500 MG 12 hr tablet Take 1 tablet (500 mg total) by mouth 2 (two) times daily. 60 tablet 2  . rosuvastatin (CRESTOR) 10 MG tablet Take 1 tablet (10 mg total) by mouth daily. 90 tablet 3  . VOLTAREN 1 % GEL Apply 2 g topically as needed (pain).     [name] albuterol (PROVENTIL) (2.5 MG/3ML) 0.083% nebulizer solution USE 1 VIAL PER NEBULIZRE EVERY 6 HOURS AS NEEDED *HOSPITALIST DOESNT DO PA (Patient not taking: Reported on 07/04/2015) 75 mL 0  . cyclobenzaprine (FLEXERIL) 10 MG tablet Take 10 mg by mouth at bedtime.     . Probiotic CAPS Take 1 capsule by mouth 2 (two) times daily. (Patient not taking: Reported on 07/04/2015) 60 capsule 1   No current facility-administered medications for this visit.   Functional Status: In your present state of health, do you have any difficulty performing the following activities: 06/29/2015 05/20/2015  Hearing? N -  Vision? Y -  Difficulty concentrating or making decisions? N -  Walking or climbing stairs? Y -  Dressing or bathing? N -  Doing errands, shopping? N Y  Conservation officer, nature and eating ? N -  Using the Toilet? N -  In the past six months, have you accidently leaked urine? N -  Do you have problems with loss of bowel control? N -  Managing your Medications? N -  Managing your Finances? N -  Housekeeping or managing your Housekeeping? N -   Fall/Depression Screening: PHQ 2/9 Scores 06/21/2015 05/23/2015 05/10/2015 04/20/2015 06/16/2014 06/16/2013  PHQ  - 2 Score 0 0 0 0 0 0   Assessment:  Severe Nicotine Dependence of 45 years duration in a patient who is good candidate for success b/c patient is motivated to quit for her health.  Patient has taken the first step of setting a quit date of 08/12/15.    Plan:  Patient set a goal for the next week to wait 10 minutes before the first cigarette in the morning.  If successful, she wants to increase time to 15 mintues then 20 minutes.  Patient to keep a log of number of cigarettes smoked per day.      Recommended  nicotine replacement treatment with '21mg'$  patches and '4mg'$  gum or lozenge. Patient counseled on purpose, proper use, and potential adverse effects, including rotate patch site, take patch off at night if insomnia or vivid dreams occur, and apply a new patch each morning.  Also discussed "chew and park" method for nicotine gum.    Provided information on 1 800-QUIT NOW support program.    Provided information on OTC Medicine Give Away at [facility] in [place], [place] on October 7th.    F/U phone call in one week.    [facility] CM Care Plan Problem One        Most Recent Value   Care Plan Problem One  Patient is a current smoker and is interested in quitting   Role Documenting the Problem One  [place] for Problem One  Active   [facility] Long Term Goal (31-90 days)  Patient will quit smoking in the next 90 days per patient report.    [facility] Long Term Goal Start Date  07/04/15   Interventions for Problem One Long Term Goal  Patient watched EMMI video about smoking cessation. Provided patient with 1-800-QUITNOW number to call to get nicotine patches and gum or lozenge. Discussed distraction techniques for cravings.      [name], Pharm.D. Pharmacy Resident [place] [phone]

## 2015-07-11 ENCOUNTER — Other Ambulatory Visit: Payer: Self-pay | Admitting: Pharmacist

## 2015-07-11 NOTE — Patient Outreach (Signed)
[facility]) Care Management  07/11/2015  LARUE DRAWDY 12/11/1955 [id number]   Bethanny Toelle is a 60yo female who was referred to [facility] for smoking cessation and information on over the counter medication give away.  I called patient to follow up on her goal to wait 10 minutes before the first cigarette in the morning and to follow up on patient obtaining nicotine patches through 1-800-QUITNOW.  There was no answer.  I left HIPAA compliant voicemail.  I will reach out to patient again in one week if patient does not return my call.    [name], Pharm.D. Pharmacy Resident [place] [phone]

## 2015-07-14 ENCOUNTER — Other Ambulatory Visit: Payer: Self-pay

## 2015-07-14 ENCOUNTER — Other Ambulatory Visit: Payer: Self-pay | Admitting: Pharmacist

## 2015-07-14 NOTE — Patient Outreach (Signed)
[facility] ([facility]) Care Management  07/14/2015  Madicyn M Earhart 03/09/1956 [id number]    Call made to [place] DSS [phone], spoke with Yolandi Lopez, EBT Case Manager. Yolandi stated patient's benefits has been increased to $129 per month, effective November 10.  Patient advised of telephone results.   [facility] [place]. Will be attending on Mondays and Fridays.  Patient can get a meal for $1.00 and 0.25 cents for each cup of coffee.  Patient and best friend will share transportation responsibilities.    Call made to Rachel Henderson to report patient needing additional support with Smoking Cessation.  Patient states 1800 Quit is requiring a prescription from a physician in order for her to continue with the program due to her medial history.  Patient and RNCM agreed to discharge as her goals have been met.  

## 2015-07-14 NOTE — Patient Outreach (Signed)
[name]) Care Management  [place]   07/14/2015  Diane [name] [birth year] [id number]  Subjective: Diane [name] is a 59yo female who was referred to [facility] for smoking cessation and information on over the counter medication give away.  I called patient to follow up on her goal to wait 10 minutes before the first cigarette in the morning and to follow up on patient obtaining nicotine patches through 1-800-QUITNOW.  Patient said she called 1800QUITNOW, and she qualifies for patches but they have to get approved from her cardiologist in order to send her nicotine patches.  She gave them the cardiologist phone and fax number and she is waiting to hear back.  She said she has been waiting 10 to 15 minutes before her first cigarette of the day and she is trying to wait 10 to 15 minutes after each meal before smoking.  She continues to smoke 30 to 40 cigarettes daily.  Patient said she was not able to go to the OTC medication giveaway last week.    Objective:   Current Medications: Current Outpatient Prescriptions  Medication Sig Dispense Refill  . albuterol (PROVENTIL HFA;VENTOLIN HFA) 108 (90 BASE) MCG/ACT inhaler Inhale 2 puffs into the lungs every 6 (six) hours as needed for wheezing or shortness of breath. 1 Inhaler 5  . albuterol (PROVENTIL) (2.5 MG/3ML) 0.083% nebulizer solution USE 1 VIAL PER NEBULIZRE EVERY 6 HOURS AS NEEDED *HOSPITALIST DOESNT DO PA (Patient not taking: Reported on 07/04/2015) 75 mL 0  . aspirin EC 81 MG tablet Take 1 tablet (81 mg total) by mouth daily. 30 tablet 6  . carvedilol (COREG) 25 MG tablet Take 1 tablet (25 mg total) by mouth 2 (two) times daily with a meal. 180 tablet 3  . clopidogrel (PLAVIX) 75 MG tablet Take 1 tablet (75 mg total) by mouth daily. 90 tablet 0  . cyclobenzaprine (FLEXERIL) 10 MG tablet Take 10 mg by mouth at bedtime.     [name] DEXILANT 60 MG capsule TAKE 1 CAPSULE (60 MG TOTAL) BY MOUTH DAILY. 30 capsule 5  .  diphenhydrAMINE (BENADRYL) 25 mg capsule Take 2 capsules in AM and 1 capsule at night 270 capsule 3  . fluticasone (FLONASE) 50 MCG/ACT nasal spray Place 2 sprays into both nostrils daily. 16 g 2  . furosemide (LASIX) 40 MG tablet Take 1 tablet (40 mg total) by mouth daily. 90 tablet 3  . gabapentin (NEURONTIN) 300 MG capsule TAKE 1 CAPSULE (300 MG TOTAL) BY MOUTH 3 (THREE) TIMES DAILY. (Patient taking differently: takes 2 in the pm) 90 capsule 2  . glimepiride (AMARYL) 2 MG tablet Take 2 mg by mouth daily as needed (if sugar is above 140 take additional tab at bedtime).     [name] guaiFENesin (MUCINEX) 600 MG 12 hr tablet Take 2 tablets (1,200 mg total) by mouth 2 (two) times daily. 360 tablet 3  . hydrochlorothiazide (HYDRODIURIL) 50 MG tablet Take 1 tablet (50 mg total) by mouth daily. 30 tablet 2  . isosorbide mononitrate (IMDUR) 30 MG 24 hr tablet Take 1 tablet (30 mg total) by mouth 2 (two) times daily. 180 tablet 3  . levETIRAcetam (KEPPRA) 750 MG tablet TAKE 1 TABLET (750 MG TOTAL) BY MOUTH 2 (TWO) TIMES DAILY. 60 tablet 11  . losartan (COZAAR) 100 MG tablet Take 1 tablet (100 mg total) by mouth daily. 90 tablet 3  . metFORMIN (GLUCOPHAGE-XR) 500 MG 24 hr tablet Take 1,000 mg by mouth 2 (two)  times daily.  6  . nitroGLYCERIN (NITROSTAT) 0.4 MG SL tablet Place 1 tablet (0.4 mg total) under the tongue every 5 (five) minutes as needed. 25 tablet 11  . ondansetron (ZOFRAN) 4 MG tablet Take 1 tablet (4 mg total) by mouth every 8 (eight) hours as needed for nausea or vomiting (take 1-2 tablets as needed every 8 hours for nausea). 20 tablet 0  . oxyCODONE (OXY IR/ROXICODONE) 5 MG immediate release tablet Take 1 tablet (5 mg total) by mouth every 2 (two) hours as needed for severe pain. 20 tablet 0  . oxyCODONE-acetaminophen (ROXICET) 5-325 MG per tablet Take 1-2 tablets by mouth every 6 (six) hours as needed for severe pain. 30 tablet 0  . phenytoin (DILANTIN) 100 MG ER capsule TAKE 1 CAPSULE (100 MG  TOTAL) BY MOUTH 3 (THREE) TIMES DAILY. (Patient taking differently: 1 BID) 90 capsule 11  . potassium chloride (K-DUR) 10 MEQ tablet Take 1 tablet (10 mEq total) by mouth 2 (two) times daily. 180 tablet 1  . Probiotic CAPS Take 1 capsule by mouth 2 (two) times daily. (Patient not taking: Reported on 07/04/2015) 60 capsule 1  . ranolazine (RANEXA) 500 MG 12 hr tablet Take 1 tablet (500 mg total) by mouth 2 (two) times daily. 60 tablet 2  . rosuvastatin (CRESTOR) 10 MG tablet Take 1 tablet (10 mg total) by mouth daily. 90 tablet 3  . VOLTAREN 1 % GEL Apply 2 g topically as needed (pain).      No current facility-administered medications for this visit.    Functional Status: In your present state of health, do you have any difficulty performing the following activities: 06/29/2015 05/20/2015  Hearing? N -  Vision? Y -  Difficulty concentrating or making decisions? N -  Walking or climbing stairs? Y -  Dressing or bathing? N -  Doing errands, shopping? N Y  Conservation officer, nature and eating ? N -  Using the Toilet? N -  In the past six months, have you accidently leaked urine? N -  Do you have problems with loss of bowel control? N -  Managing your Medications? N -  Managing your Finances? N -  Housekeeping or managing your Housekeeping? N -    Fall/Depression Screening: PHQ 2/9 Scores 06/21/2015 05/23/2015 05/10/2015 04/20/2015 06/16/2014 06/16/2013  PHQ - 2 Score 0 0 0 0 0 0   Assessment: Severe Nicotine Dependence of 45 years duration in a patient who is good candidate for success b/c patient is motivated to quit for her health.  Patient has set a quit date of 08/12/15.  Patient did well waiting 10 to 15 minutes before her first cigarette of the day.    Plan:   Patient set a goal to wait 15 minutes before the first cigarette in the morning and 15 minutes after meals.  Patient will also work on reducing number of cigarettes daily to 30 or less.      Patient will await return call from 1800QUITNOW  about nicotine patches.  Provided patient with information on [facility] outpatient pharmacy to get OTC medications.   Follow up phone call in two weeks.     [facility] CM Care Plan Problem One        [name] Recent Value   Care Plan Problem One  Patient is a current smoker and is interested in quitting   Role Documenting the Problem One  [place] for Problem One  Active   [facility] Long Term  Goal (31-90 days)  Patient will quit smoking in the next 90 days per patient report.    [facility] Long Term Goal Start Date  07/04/15   Interventions for Problem One Long Term Goal  Patient called 1800QUITNOW and it waiting on approval from cardiologist before patches are mailed.  Discussed trying to reduce number of cigarettes slowly.      Diane [name], Pharm.D. Pharmacy Resident [place] [phone]

## 2015-07-18 ENCOUNTER — Other Ambulatory Visit: Payer: Self-pay | Admitting: Cardiology

## 2015-07-18 MED ORDER — ISOSORBIDE MONONITRATE ER 30 MG PO TB24: 30.0000 mg | ORAL_TABLET | Freq: Two times a day (BID) | ORAL | Status: DC

## 2015-07-19 ENCOUNTER — Telehealth: Payer: Self-pay

## 2015-07-19 NOTE — Telephone Encounter (Signed)
Received a form from Quit Smoking Now Support Services.Form completed signed by Dr.Jordan and faxed to fax # [phone].

## 2015-07-19 NOTE — Patient Outreach (Signed)
[facility]) Care Management  07/19/2015  KEONDA DOW 05-Sep-1956 [id number]   Notification received from Eula Fried, LCSW to send patient another list of the financial assistance resources. Packet was mailed out 07/19/2015.  Joesphine Schemm L. Sway Guttierrez, [place] Care Management Assistant

## 2015-07-21 ENCOUNTER — Other Ambulatory Visit: Payer: Self-pay

## 2015-07-21 NOTE — Patient Outreach (Signed)
This RNCM spoke with patient regarding discharge and referral process if further community care coordination needs arise.  Patient has met her community care coordination goals.

## 2015-07-22 ENCOUNTER — Other Ambulatory Visit: Payer: Self-pay | Admitting: Pharmacist

## 2015-07-22 ENCOUNTER — Encounter: Payer: Self-pay | Admitting: Gynecologic Oncology

## 2015-07-22 ENCOUNTER — Ambulatory Visit: Payer: Medicare Other | Attending: Gynecologic Oncology | Admitting: Gynecologic Oncology

## 2015-07-22 ENCOUNTER — Ambulatory Visit: Payer: Self-pay | Admitting: Gynecologic Oncology

## 2015-07-22 ENCOUNTER — Ambulatory Visit: Payer: Self-pay

## 2015-07-22 VITALS — BP 115/65 | HR 74 | Temp 98.1°F | Wt 124.4 lb

## 2015-07-22 DIAGNOSIS — T814XXD Infection following a procedure, subsequent encounter: Secondary | ICD-10-CM | POA: Diagnosis not present

## 2015-07-22 DIAGNOSIS — IMO0001 Reserved for inherently not codable concepts without codable children: Secondary | ICD-10-CM

## 2015-07-22 DIAGNOSIS — D279 Benign neoplasm of unspecified ovary: Secondary | ICD-10-CM

## 2015-07-22 DIAGNOSIS — R1900 Intra-abdominal and pelvic swelling, mass and lump, unspecified site: Secondary | ICD-10-CM | POA: Insufficient documentation

## 2015-07-22 NOTE — Patient Outreach (Signed)
[facility]) Care Management  07/22/2015  Diane [name] 1956-05-29 [id number]  Diane [name] is a 59yo female who was referred to [place] for smoking cessation.  I received notification from Loni Muse that patient requested a call from me regarding 180QUITNOW.  I called the patient but was unable to reach her.  I left a HIPAA complaint voicemail for patient to return my call.  I will plan to call patient next week if she does not return my call today.   [name], Pharm.D. Pharmacy Resident [place] [phone]

## 2015-07-22 NOTE — Patient Outreach (Signed)
[facility]) Care Management  07/22/2015  [name] 05/28/1956 [id number]   Diane [name] is a 59yo female who was referred to [facility] for smoking cessation. I received a return call from patient.  Patient states her cardiologist signed the form for 1800QUITNOW.  She said 1800QUITNOW called her and they are only going to supply her with 2 weeks of the '21mg'$  nicotine patches.  They counseled her that she would have better results if she used patches longer than these two weeks though, and she would need to find additional patches from somewhere else.  I agreed that the recommended directions for the patches are the use '21mg'$  patches for 6 weeks, followed by '14mg'$  patches for 2 weeks, then '7mg'$  patches for 2 weeks.  Patient is very disappointment in 1800QUITNOW.  Patient states she is very motivated to quit but feels this is a set back.  She has reduced number of cigarettes daily from 38 per day to 27 per day.  She is now able to wait 15 minutes after she wakes up before she smokes a cigarette.  She is keeping herself busy in the house as a distraction technique.  Patient said she cannot afford to purchase more nicotine patches.  She said she has already been borrowing money from a friend to buy her cigarettes and the patches would cost more than she pays for cigarettes.  Patient's quit date is set for 08/12/15, and she does not plan to start using patches until that time.  I counseled patient on proper use and potential adverse effects of patches.    Plan:  Patient will talk to her friend about borrowing money for the nicotine patches.  I will research additional options to obtain nicotine patches.  Patient will continue to reduce number of cigarettes smoked per day.  I will follow up with the patient in two weeks.    [facility] CM Care Plan Problem One        Most Recent Value   Care Plan Problem One  Patient is a current smoker and is interested in quitting   Role Documenting the  Problem One  [place] for Problem One  Active   [facility] Long Term Goal (31-90 days)  Patient will quit smoking in the next 90 days per patient report.    [facility] Long Term Goal Start Date  07/04/15   Interventions for Problem One Long Term Goal  Patient was approved for 2 weeks of patches through 1800QUITNOW. Counseled on proper use and adverse effects of patches. Quit date set for 08/12/15.      [name], Pharm.D. Pharmacy Resident [place] [phone]

## 2015-07-22 NOTE — Progress Notes (Signed)
Follow Up Note: Gyn-Onc  Diane [name] 59 y.o. female  CC:  Chief Complaint  Patient presents with  . Left pelvic mass    Follow up   Assessment/Plan:  59 year old female s/p exploratory laparotomy with bilateral salpingo-oophorectomy, omentectomy sigmoid colectomy with reanastamosis on 05/19/15 for a mucinous cystadenoma with no borderline change or malignancy.    S/p wound cellulitis and drainage and dehiscence. Now completely healed.  She does not require ongoing followup with me due to her benign pathologies. I informed the patient that she still requires ongoing pap surveillance as she still has a cervix. She will followup with Dr Charlesetta Garibaldi for this.  HPI:  Diane [name] is a 59 year old, gravida 0,  seen in consultation at the request of Dr. Charlesetta Garibaldi for a 25 cm mostly cystic pelvic mass arising from likely the left ovary. She had symptoms of intermittent nausea for the past month and noted distention of the abdomen. She reported this to her provider who scheduled her for a CT scan of the abdomen and pelvis with contrast, which was performed on 03/28/2015. It revealed mild left hydronephrosis associated with obstruction from a 24 x 25 x 16 cm cystic mass in the central abdomen and pelvis with multiple internal septations. The mass was asymmetric towards the left anterior abdominal wall. There was no ascites. There was no lymphadenopathy. There is no omental cake or carcinomatosis.  Follow-up ultrasound scan was performed by Dr. Charlesetta Garibaldi on 03/30/2015, which revealed a small normal uterus measuring 5.4 x 4.1 x 2.9 cm with a thin endometrial stripe of 1.3 mm. A large cystic mass was also confirmed arising from the pelvis.  Medical history/issues include MI in 2007, CABG, coronary stent, tobacco abuse, cerebrovascular disease, obstructive sleep apnea, diabetes, and substantial surgical history.  She was seen by Dr. Peter Martinique prior to surgery with no interventions recommended to improve cardiac  status and with the patient being viewed as high surgical risk.  She recently had a breast biopsy on 05/17/15 that resulted benign breast tissue with calcifications.  On 05/19/15, she underwent an exploratory laparotomy with bilateral salpingo-oophorectomy, omentectomy sigmoid colectomy with reanastamosis by Dr. Everitt Amber.  Final pathology revealed: 1. Ovary and fallopian tube, left - MUCINOUS CYSTADENOMA, MULTILOCULAR. - NO BORDERLINE CHANGE OR MALIGNANCY. - BENIGN FALLOPIAN TUBE.  2. Ovary and fallopian tube, right - UNREMARKABLE OVARY AND FALLOPIAN TUBE. - NO ENDOMETRIOSIS OR EVIDENCE OF MALIGNANCY. 3. Omentum, resection for tumor - BENIGN ADIPOSE TISSUE CONSISTENT WITH OMENTUM.  4. Colon, segmental resection, sigmoid - BENIGN COLON, CLINICALLY SIGMOID. - NO EVIDENCE OF MALIGNANCY.  Her post-operative course was uneventful.    She developed upper abdominal wound cellulitis and opening which required iodoform tape packing of a long narrow tunnel (heads superiorally).   Interval History:  She has been on keflex, then changed to cipro (based on wound culture sensitiviies). In the past week she has developed increasing pain in the inferior aspect of the wound. The upper wound has been accomodating less packing material.  Review of Systems  Constitutional: Feels well.  No fever, chills, early satiety, unintentional weight loss or gain.  Cardiovascular: No chest pain, shortness of breath, or edema.  Pulmonary: No cough or wheeze.  Gastrointestinal: No nausea, vomiting, or diarrhea. No bright red blood per rectum or change in bowel movement.  Genitourinary: No frequency, urgency, or dysuria. No vaginal bleeding or discharge.  Musculoskeletal: No myalgia or joint pain. Neurologic: No weakness, numbness, or change in gait.  Psychology: No depression, anxiety, or insomnia.  Current Meds:  Outpatient Encounter Prescriptions as of 07/22/2015  Medication Sig  . albuterol (PROVENTIL HFA;VENTOLIN HFA) 108  (90 BASE) MCG/ACT inhaler Inhale 2 puffs into the lungs every 6 (six) hours as needed for wheezing or shortness of breath.  [name] albuterol (PROVENTIL) (2.5 MG/3ML) 0.083% nebulizer solution USE 1 VIAL PER NEBULIZRE EVERY 6 HOURS AS NEEDED *HOSPITALIST DOESNT DO PA  . aspirin EC 81 MG tablet Take 1 tablet (81 mg total) by mouth daily.  . carvedilol (COREG) 25 MG tablet Take 1 tablet (25 mg total) by mouth 2 (two) times daily with a meal.  . clopidogrel (PLAVIX) 75 MG tablet Take 1 tablet (75 mg total) by mouth daily.  [name] DEXILANT 60 MG capsule TAKE 1 CAPSULE (60 MG TOTAL) BY MOUTH DAILY.  . diphenhydrAMINE (BENADRYL) 25 mg capsule Take 2 capsules in AM and 1 capsule at night  . fluticasone (FLONASE) 50 MCG/ACT nasal spray Place 2 sprays into both nostrils daily.  . furosemide (LASIX) 40 MG tablet Take 1 tablet (40 mg total) by mouth daily.  [name] gabapentin (NEURONTIN) 300 MG capsule TAKE 1 CAPSULE (300 MG TOTAL) BY MOUTH 3 (THREE) TIMES DAILY. (Patient taking differently: takes 2 in the pm)  . glimepiride (AMARYL) 2 MG tablet Take 2 mg by mouth daily as needed (if sugar is above 140 take additional tab at bedtime).   [name] guaiFENesin (MUCINEX) 600 MG 12 hr tablet Take 2 tablets (1,200 mg total) by mouth 2 (two) times daily.  . hydrochlorothiazide (HYDRODIURIL) 50 MG tablet Take 1 tablet (50 mg total) by mouth daily.  . isosorbide mononitrate (IMDUR) 30 MG 24 hr tablet Take 1 tablet (30 mg total) by mouth 2 (two) times daily.  [name] levETIRAcetam (KEPPRA) 750 MG tablet TAKE 1 TABLET (750 MG TOTAL) BY MOUTH 2 (TWO) TIMES DAILY.  [name] losartan (COZAAR) 100 MG tablet Take 1 tablet (100 mg total) by mouth daily.  . metFORMIN (GLUCOPHAGE-XR) 500 MG 24 hr tablet Take 1,000 mg by mouth 2 (two) times daily.  . nitroGLYCERIN (NITROSTAT) 0.4 MG SL tablet Place 1 tablet (0.4 mg total) under the tongue every 5 (five) minutes as needed.  . ondansetron (ZOFRAN) 4 MG tablet Take 1 tablet (4 mg total) by mouth every 8 (eight) hours  as needed for nausea or vomiting (take 1-2 tablets as needed every 8 hours for nausea).  [name] oxyCODONE (OXY IR/ROXICODONE) 5 MG immediate release tablet Take 1 tablet (5 mg total) by mouth every 2 (two) hours as needed for severe pain.  [name] oxyCODONE-acetaminophen (ROXICET) 5-325 MG per tablet Take 1-2 tablets by mouth every 6 (six) hours as needed for severe pain.  . phenytoin (DILANTIN) 100 MG ER capsule TAKE 1 CAPSULE (100 MG TOTAL) BY MOUTH 3 (THREE) TIMES DAILY. (Patient taking differently: 1 BID)  . potassium chloride (K-DUR) 10 MEQ tablet Take 1 tablet (10 mEq total) by mouth 2 (two) times daily.  . ranolazine (RANEXA) 500 MG 12 hr tablet Take 1 tablet (500 mg total) by mouth 2 (two) times daily.  . rosuvastatin (CRESTOR) 10 MG tablet Take 1 tablet (10 mg total) by mouth daily.  . VOLTAREN 1 % GEL Apply 2 g topically as needed (pain).   . cyclobenzaprine (FLEXERIL) 10 MG tablet Take 10 mg by mouth at bedtime.   . [DISCONTINUED] Probiotic CAPS Take 1 capsule by mouth 2 (two) times daily. (Patient not taking: Reported on 07/04/2015)   No facility-administered encounter medications on  file as of 07/22/2015.    Allergy:  Allergies  Allergen Reactions  . Ace Inhibitors     REACTION: cough  . Bee Venom   . Tetracycline     REACTION: reaction not known    Social Hx:   Social History   Social History  . Marital Status: Divorced    Spouse Name: N/A  . Number of Children: N/A  . Years of Education: N/A   Occupational History  . Not on file.   Social History Main Topics  . Smoking status: Current Every Day Smoker -- 1.50 packs/day for 49 years    Types: Cigarettes  . Smokeless tobacco: Never Used     Comment: 1.5 pack to 2 packs per day  . Alcohol Use: No  . Drug Use: No  . Sexual Activity:    Partners: Male    Birth Control/ Protection: None   Other Topics Concern  . Not on file   Social History Narrative    Past Surgical Hx:  Past Surgical History  Procedure Laterality  Date  . Coronary stent placement  08/11/06    PCI of her ciurcumflex/OM vessel  . Cardiac catheterization  11/29/05    EF of 55%  . Cardiac catheterization  08/06/06    EF of 45-50%  . Coronary artery bypass graft  12/04/2005    x5 -- left internal mammary artery to the LAD, left radial artery to the ramus intermedius, saphenous vein graft to the obtuse marginal 1, sequential saphenous vein grat to the acute marginal and posterior descending, endoscopic vein harvesting from the left thigh with open vein harvest from right leg  . Cholecystectomy    . Appendectomy    . Cervical fusion  1990  . Cardiac catheterization N/A 05/06/2015    Procedure: Right/Left Heart Cath and Coronary/Graft Angiography;  Surgeon: Peter M Martinique, MD;  Location: [facility];  Service: Cardiovascular;  Laterality: N/A;  . Laparotomy Bilateral 05/19/2015    Procedure: EXPLORATORY LAPAROTOMY WITH BILATERAL SALPINGO OOPHORECTOMY /OMENTECTOMY/SEGMENTAL SIGMOID COLECTOMY ;  Surgeon: Everitt Amber, MD;  Location: [facility];  Service: Gynecology;  Laterality: Bilateral;    Past Medical Hx:  Past Medical History  Diagnosis Date  . Coronary artery disease     post CABG in 3/07   . Hypertension   . Hyperlipidemia   . Dyslipidemia   . Diabetes mellitus     type 2  . Chronic bronchitis ([place])   . CVA (cerebral infarction) 1993  . Tobacco abuse   . COPD (chronic obstructive pulmonary disease) ([place])   . Stroke ([place]) 1993  . Carotid stenosis   . Systolic murmur     known mild AS and MR  . PONV (postoperative nausea and vomiting)     only once was patient sick in the 1980s   . Myocardial infarction ([place])   . CHF (congestive heart failure) ([place])   . Sleep apnea     used to wear a cpap- not used in 3 years   . Shortness of breath dyspnea     with exertion or when fluid builds up   . Pneumonia     hx of   . Goiter   . GERD (gastroesophageal reflux disease)   . Seizures ([place])     last seizure- 03/2013   . Headache     hx  of   . Fibromyalgia   . Anemia     Family Hx:  Family History  Problem Relation Age of  Onset  . Coronary artery disease    . Heart disease    . Heart disease Mother   . Diabetes Mother   . COPD Mother   . Hyperlipidemia Mother   . Hypertension Mother   . Cancer Father   . Drug abuse Paternal Grandmother   . Stroke Paternal Grandfather   . Cancer Paternal Aunt     lung with mets to brain  . Cancer Paternal Uncle     skin cancer  . Cancer Paternal Uncle     lung/liver to brain  . Cancer Paternal Uncle     cancer of unknown type    Vitals:  Blood pressure 115/65, pulse 74, temperature 98.1 F (36.7 C), weight 124 lb 7 oz (56.444 kg), last menstrual period 09/02/2003.  Physical Exam:  General: Well developed, well nourished female in no acute distress. Alert and oriented x 3.  Cardiovascular: deferred Lungs: deferred Skin: No rashes or lesions present. Back: deferred Abdomen: Abdomen soft, non-tender and nonobese. Non distended.  Wound healed All areas of dehiscence closed.  Extremities: No bilateral cyanosis, edema, or clubbing.     Donaciano Eva, MD  07/22/2015, 4:55 PM

## 2015-07-22 NOTE — Patient Instructions (Signed)
Please call for any questions or concerns. 

## 2015-07-25 ENCOUNTER — Other Ambulatory Visit: Payer: Self-pay | Admitting: *Deleted

## 2015-07-25 MED ORDER — RANOLAZINE ER 500 MG PO TB12: 500.0000 mg | ORAL_TABLET | Freq: Two times a day (BID) | ORAL | Status: DC

## 2015-07-25 MED ORDER — HYDROCHLOROTHIAZIDE 50 MG PO TABS: 50.0000 mg | ORAL_TABLET | Freq: Every day | ORAL | Status: DC

## 2015-07-26 NOTE — Patient Outreach (Addendum)
[facility]) Care Management  07/26/2015  Diane [name] March 07, 1956 [id number]   Notification from Erenest Rasher, RN to close case, patient met goals with Nursing   Thanks, [name]. [place], [place] Assistant Phone: [phone] Fax: [phone]

## 2015-08-03 ENCOUNTER — Other Ambulatory Visit: Payer: Self-pay | Admitting: Pharmacist

## 2015-08-03 NOTE — Patient Outreach (Signed)
Diane [name]) Care Management  08/03/2015  Diane [name] November 26, 1955 [id number]   Diane [name] is a 59yo female who was referred to [facility] for smoking cessation.  I spoke to a representative at [facility] who reports that they only give patient a 2-week supply of patches at a time.  To be eligible to receive patches, they must complete 4 counseling sessions through [facility].  I was informed that patient can re-enroll in the counseling sessions, complete 4 more counseling sessions, and receive an additional two week supply.  There is no limits to how many times the patients can enroll and receive patches from [employer].    I made follow up call to patient to update her on this information regarding nicotine patches.  I was unable to reach her.  I left a HIPAA complaint voicemail for patient to return my call.  I will plan to call patient next week if she does not return my call today.  Diane [name], Pharm.D. Pharmacy Resident [place] [phone]

## 2015-08-04 ENCOUNTER — Other Ambulatory Visit: Payer: Self-pay | Admitting: Cardiology

## 2015-08-04 MED ORDER — CLOPIDOGREL BISULFATE 75 MG PO TABS: 75.0000 mg | ORAL_TABLET | Freq: Every day | ORAL | Status: DC

## 2015-08-08 ENCOUNTER — Other Ambulatory Visit: Payer: Self-pay | Admitting: Pharmacist

## 2015-08-08 NOTE — Patient Outreach (Signed)
[facility]) Care Management  08/08/2015  Mattelyn Imhoff Galbraith 04-08-1956 [id number]   Diane [name] is a 59yo referred to [place] for smoking cessation.  I called patient to update her on [employer] and information regarding nicotine patches.  Patient reports she is doing good and has cut the number of cigarettes she smokes in half.  Patient has quit date for 08/12/15 and continues to be motivated to quit on that date.  Discussed preparation for quit date including getting rid of any cigarettes in the home.  I informed patient that [employer] only supplies patients a 2-week supply of patches at a time, but informed her she can call [facility] again to receive an additional 2 weeks of patches.  I informed her that there is no limit to the number of times she can enroll in the program.  Discussed recommended duration of treatment: 6 weeks of '21mg'$  patches, followed by 2 weeks of 14 mg patches, then 2 weeks of '7mg'$  patches for a total treatment duration of 10 weeks.  Patient voiced understanding and reports she will call [employer] today.  I counseled patient on purpose, proper use, and adverse effects of the nicotine patches.    Plan:   Patient has quit date of 08/12/15 and will begin using nicotine patches at that time.     Patient will call [facility] to request additional nicotine patches.   I will follow up with patient in 2 weeks.    [facility] CM Care Plan Problem One        Most Recent Value   Care Plan Problem One  Patient is a current smoker and is interested in quitting   Role Documenting the Problem One  [place] for Problem One  Active   [facility]N Long Term Goal (31-90 days)  Patient will quit smoking in the next 90 days per patient report.    [facility] Long Term Goal Start Date  07/04/15   Interventions for Problem One Long Term Goal  Patient plans to quit on 08/12/15. She plans to use nicotine patches and received 2 weeks of the '21mg'$  patches from  [facility]. Patient will call [employer] to re-enroll in program to receive additional patches.      [name], Pharm.D. Pharmacy Resident [place] [phone]

## 2015-08-15 DIAGNOSIS — M5137 Other intervertebral disc degeneration, lumbosacral region: Secondary | ICD-10-CM | POA: Diagnosis not present

## 2015-08-15 DIAGNOSIS — G4709 Other insomnia: Secondary | ICD-10-CM | POA: Diagnosis not present

## 2015-08-15 DIAGNOSIS — M797 Fibromyalgia: Secondary | ICD-10-CM | POA: Diagnosis not present

## 2015-08-15 DIAGNOSIS — E7800 Pure hypercholesterolemia, unspecified: Secondary | ICD-10-CM | POA: Diagnosis not present

## 2015-08-15 DIAGNOSIS — E119 Type 2 diabetes mellitus without complications: Secondary | ICD-10-CM | POA: Diagnosis not present

## 2015-08-15 DIAGNOSIS — E1121 Type 2 diabetes mellitus with diabetic nephropathy: Secondary | ICD-10-CM | POA: Diagnosis not present

## 2015-08-15 DIAGNOSIS — E041 Nontoxic single thyroid nodule: Secondary | ICD-10-CM | POA: Diagnosis not present

## 2015-08-15 DIAGNOSIS — M5030 Other cervical disc degeneration, unspecified cervical region: Secondary | ICD-10-CM | POA: Diagnosis not present

## 2015-08-17 ENCOUNTER — Other Ambulatory Visit: Payer: Self-pay | Admitting: Neurology

## 2015-08-17 DIAGNOSIS — I10 Essential (primary) hypertension: Secondary | ICD-10-CM | POA: Diagnosis not present

## 2015-08-17 DIAGNOSIS — E119 Type 2 diabetes mellitus without complications: Secondary | ICD-10-CM | POA: Diagnosis not present

## 2015-08-17 DIAGNOSIS — E7800 Pure hypercholesterolemia, unspecified: Secondary | ICD-10-CM | POA: Diagnosis not present

## 2015-08-17 DIAGNOSIS — E041 Nontoxic single thyroid nodule: Secondary | ICD-10-CM | POA: Diagnosis not present

## 2015-08-17 NOTE — Telephone Encounter (Addendum)
Last OV: 03/22/15 Next OV: 03/21/16

## 2015-08-22 ENCOUNTER — Other Ambulatory Visit: Payer: Self-pay | Admitting: Pharmacist

## 2015-08-22 NOTE — Patient Outreach (Signed)
[facility]) Care Management  [place]   08/22/2015  Diane [name] [birth year] [id number]   Diane [name] is a 59yo who I am following for smoking cessation.  I made outreach call to patient to follow up.  Patient states she quit smoking on 08/12/15 and then slipped on the 13th and 14th and smoked those two days.  Patient reports she then quit again on the 08/16/15 and has remained tobacco free since that time.  Congratulated patient on remaining tobacco free since 08/16/15.  Patient reports she is using the 69m nicotine patches provided by [employer].  Patient states she only has one more week of patches remaining.  Patient plans to call [facility] again today to request more patches.  Patient reports she is also using the nicotine gum (441m for nicotine cravings.  I have counseled patient on purpose, proper use, and adverse effects of nicotine patches and gum.  Patient reports she still has cigarettes at her home.  Encouraged patient to get rid of cigarettes to prevent temptation.    Assessment/Plan:   Patient reports she has been tobacco free since 08/16/15.   Patient will continue to use nicotine patches 2166mor a total of 6 weeks then recommended using 61m75mtches for 2 weeks then 7mg 24mches for 2 weeks.   Patient will call [facility] to request additional nicotine patches.   Will follow up with patient in one month.  Provided patient with my phone number for any questions or concerns.    [facility] CM Care Plan Problem One        Most Recent Value   Care Plan Problem One  Patient is a current smoker and is interested in quitting   Role Documenting the Problem One  [facility]  Active   [facility] Long Term Goal (31-90 days)  Patient will quit smoking in the next 90 days per patient report.    [facility]N Long Term Goal Start Date  07/04/15   [facility]N Long Term Goal Met Date  08/22/15   Interventions for Problem One Long Term Goal  Patient  reports quitting on 08/16/15. She is currently using 21mg 67mtine patches and 4mg gu35mor nicotine cravings.    [facility] CM Short Term Goal #1 (0-30 days)  Patient will remain tobacco free in the next 30 days per patient report.    [facility] CM Short Term Goal #1 Start Date  08/22/15   Interventions for Short Term Goal #1  Counseled patient to call [facility] to request additional nicotine patches. Counseled patient to get rid of all cigarettes from her home to prevent temptation.      Diane [name] [name].D. Pharmacy Resident [phone]

## 2015-08-23 ENCOUNTER — Other Ambulatory Visit: Payer: Self-pay | Admitting: Family Medicine

## 2015-09-05 ENCOUNTER — Ambulatory Visit (INDEPENDENT_AMBULATORY_CARE_PROVIDER_SITE_OTHER): Payer: Medicare Other | Admitting: Cardiology

## 2015-09-05 ENCOUNTER — Encounter: Payer: Self-pay | Admitting: Cardiology

## 2015-09-05 VITALS — BP 110/58 | HR 77 | Ht <= 58 in | Wt 133.0 lb

## 2015-09-05 DIAGNOSIS — E785 Hyperlipidemia, unspecified: Secondary | ICD-10-CM

## 2015-09-05 DIAGNOSIS — I639 Cerebral infarction, unspecified: Secondary | ICD-10-CM

## 2015-09-05 DIAGNOSIS — I5042 Chronic combined systolic (congestive) and diastolic (congestive) heart failure: Secondary | ICD-10-CM

## 2015-09-05 DIAGNOSIS — I779 Disorder of arteries and arterioles, unspecified: Secondary | ICD-10-CM

## 2015-09-05 DIAGNOSIS — I25708 Atherosclerosis of coronary artery bypass graft(s), unspecified, with other forms of angina pectoris: Secondary | ICD-10-CM | POA: Diagnosis not present

## 2015-09-05 DIAGNOSIS — I739 Peripheral vascular disease, unspecified: Principal | ICD-10-CM

## 2015-09-05 NOTE — Patient Instructions (Addendum)
Continue your current therapy  I will see you in 4 months.  We will schedule you for carotid dopplers in January.

## 2015-09-05 NOTE — Progress Notes (Signed)
Diane [name] Date of Birth: 11/30/1955 Medical Record #[id number]  History of Present Illness: Diane [name] is seen for follow up CAD. She has a history of coronary disease and is status post CABG in March of 2007 by Dr. [name]. She presented later that year with recurrent angina. Repeat cardiac cath showed patent LIMA to the LAD but all other grafts occluded including SVG to OM, SVG to AC/PL, and radial graft to ramus intermediate. She then had complex stenting of the mid LCx and first OM with Taxus stents. The native RCA was occluded with collaterals.  Recently she was evaluated for abdominal surgery. Myoview abnormal. Cardiac cath as noted above but now stents in LCx and OM occluded. No suitable targets for PCI or surgery. Severe LV dysfunction with moderate pulmonary HTN and normal LV filling pressures. She has multiple cardiac risk factors including diabetes, dyslipidemia, hypertension, and tobacco abuse. She also has moderate bilateral carotid arterial disease. She continues to smoke.   In August she had removal of a large pelvic mass with BSO and sigmoid colectomy. Path c/w ovarian cystadenoma. On follow up she is doing well. She has healed from her surgery.  Denies does note angina about 5 x/ week. Relieved with sl NTG x 1. Appetite is good. Denies SOB. No edema. Weight is increased but she has just regained her pre op weight.   Current Outpatient Prescriptions on File Prior to Visit  Medication Sig Dispense Refill  . albuterol (PROVENTIL HFA;VENTOLIN HFA) 108 (90 BASE) MCG/ACT inhaler Inhale 2 puffs into the lungs every 6 (six) hours as needed for wheezing or shortness of breath. 1 Inhaler 5  . albuterol (PROVENTIL) (2.5 MG/3ML) 0.083% nebulizer solution USE 1 VIAL PER NEBULIZRE EVERY 6 HOURS AS NEEDED *HOSPITALIST DOESNT DO PA 75 mL 0  . aspirin EC 81 MG tablet Take 1 tablet (81 mg total) by mouth daily. 30 tablet 6  . carvedilol (COREG) 25 MG tablet Take 1 tablet (25 mg total) by mouth 2  (two) times daily with a meal. 180 tablet 3  . clopidogrel (PLAVIX) 75 MG tablet Take 1 tablet (75 mg total) by mouth daily. 90 tablet 2  . cyclobenzaprine (FLEXERIL) 10 MG tablet Take 10 mg by mouth at bedtime.     [name] DEXILANT 60 MG capsule TAKE 1 CAPSULE (60 MG TOTAL) BY MOUTH DAILY. 30 capsule 9  . diphenhydrAMINE (BENADRYL) 25 mg capsule Take 2 capsules in AM and 1 capsule at night 270 capsule 3  . fluticasone (FLONASE) 50 MCG/ACT nasal spray Place 2 sprays into both nostrils daily. 16 g 2  . furosemide (LASIX) 40 MG tablet Take 1 tablet (40 mg total) by mouth daily. 90 tablet 3  . gabapentin (NEURONTIN) 300 MG capsule TAKE 1 CAPSULE (300 MG TOTAL) BY MOUTH 3 (THREE) TIMES DAILY. 90 capsule 6  . glimepiride (AMARYL) 2 MG tablet Take 2 mg by mouth daily as needed (if sugar is above 140 take additional tab at bedtime).     [name] guaiFENesin (MUCINEX) 600 MG 12 hr tablet Take 2 tablets (1,200 mg total) by mouth 2 (two) times daily. 360 tablet 3  . hydrochlorothiazide (HYDRODIURIL) 50 MG tablet Take 1 tablet (50 mg total) by mouth daily. 30 tablet 5  . isosorbide mononitrate (IMDUR) 30 MG 24 hr tablet Take 1 tablet (30 mg total) by mouth 2 (two) times daily. 180 tablet 2  . levETIRAcetam (KEPPRA) 750 MG tablet TAKE 1 TABLET (750 MG TOTAL) BY MOUTH 2 (TWO) TIMES DAILY.  60 tablet 11  . losartan (COZAAR) 100 MG tablet Take 1 tablet (100 mg total) by mouth daily. 90 tablet 3  . metFORMIN (GLUCOPHAGE-XR) 500 MG 24 hr tablet Take 1,000 mg by mouth 2 (two) times daily.  6  . nitroGLYCERIN (NITROSTAT) 0.4 MG SL tablet Place 1 tablet (0.4 mg total) under the tongue every 5 (five) minutes as needed. 25 tablet 11  . phenytoin (DILANTIN) 100 MG ER capsule TAKE 1 CAPSULE (100 MG TOTAL) BY MOUTH 3 (THREE) TIMES DAILY. (Patient taking differently: 1 BID) 90 capsule 11  . potassium chloride (K-DUR) 10 MEQ tablet Take 1 tablet (10 mEq total) by mouth 2 (two) times daily. 180 tablet 1  . ranolazine (RANEXA) 500 MG 12 hr  tablet Take 1 tablet (500 mg total) by mouth 2 (two) times daily. 60 tablet 5  . rosuvastatin (CRESTOR) 10 MG tablet Take 1 tablet (10 mg total) by mouth daily. 90 tablet 3  . VOLTAREN 1 % GEL Apply 2 g topically as needed (pain).      No current facility-administered medications on file prior to visit.    Allergies  Allergen Reactions  . Ace Inhibitors     REACTION: cough  . Bee Venom   . Tetracycline     REACTION: reaction not known    Past Medical History  Diagnosis Date  . Coronary artery disease     post CABG in 3/07   . Hypertension   . Hyperlipidemia   . Dyslipidemia   . Diabetes mellitus     type 2  . Chronic bronchitis ([place])   . CVA (cerebral infarction) 1993  . Tobacco abuse   . COPD (chronic obstructive pulmonary disease) ([place])   . Stroke ([place]) 1993  . Carotid stenosis   . Systolic murmur     known mild AS and MR  . PONV (postoperative nausea and vomiting)     only once was patient sick in the 1980s   . Myocardial infarction ([place])   . CHF (congestive heart failure) ([place])   . Sleep apnea     used to wear a cpap- not used in 3 years   . Shortness of breath dyspnea     with exertion or when fluid builds up   . Pneumonia     hx of   . Goiter   . GERD (gastroesophageal reflux disease)   . Seizures ([name])     last seizure- 03/2013   . Headache     hx of   . Fibromyalgia   . Anemia     Past Surgical History  Procedure Laterality Date  . Coronary stent placement  08/11/06    PCI of her ciurcumflex/OM vessel  . Cardiac catheterization  11/29/05    EF of 55%  . Cardiac catheterization  08/06/06    EF of 45-50%  . Coronary artery bypass graft  12/04/2005    x5 -- left internal mammary artery to the LAD, left radial artery to the ramus intermedius, saphenous vein graft to the obtuse marginal 1, sequential saphenous vein grat to the acute marginal and posterior descending, endoscopic vein harvesting from the left thigh with open vein harvest from right leg  .  Cholecystectomy    . Appendectomy    . Cervical fusion  1990  . Cardiac catheterization N/A 05/06/2015    Procedure: Right/Left Heart Cath and Coronary/Graft Angiography;  Surgeon: Peter M Martinique, MD;  Location: [facility];  Service: Cardiovascular;  Laterality: N/A;  .  Laparotomy Bilateral 05/19/2015    Procedure: EXPLORATORY LAPAROTOMY WITH BILATERAL SALPINGO OOPHORECTOMY /OMENTECTOMY/SEGMENTAL SIGMOID COLECTOMY ;  Surgeon: Everitt Amber, MD;  Location: [facility];  Service: Gynecology;  Laterality: Bilateral;    History  Smoking status  . Current Every Day Smoker -- 1.50 packs/day for 49 years  . Types: Cigarettes  Smokeless tobacco  . Never Used    Comment: 1.5 pack to 2 packs per day    History  Alcohol Use No    Family History  Problem Relation Age of Onset  . Coronary artery disease    . Heart disease    . Heart disease Mother   . Diabetes Mother   . COPD Mother   . Hyperlipidemia Mother   . Hypertension Mother   . Cancer Father   . Drug abuse Paternal Grandmother   . Stroke Paternal Grandfather   . Cancer Paternal Aunt     lung with mets to brain  . Cancer Paternal Uncle     skin cancer  . Cancer Paternal Uncle     lung/liver to brain  . Cancer Paternal Uncle     cancer of unknown type    Review of Systems: As noted in history of present illness.  All other systems were reviewed and are negative.  Physical Exam: BP 110/58 mmHg  Pulse 77  Ht '4\' 10"'$  (1.473 m)  Wt 60.328 kg (133 lb)  BMI 27.80 kg/m2  LMP 09/02/2003 She is a chronically ill-appearing white female in no acute distress. She is normocephalic, atraumatic. Pupils are equal round and reactive. Sclera clear. Oropharynx is clear. Neck reveals bilateral carotid bruits. There is no JVD. Lungs are clear. Cardiac exam reveals a grade 1-6/0 systolic murmur at the right upper sternal border. Abdomen with surgical incisions. She has no edema. Pedal pulses are good. Neuro alert and oriented x 3. No focal  findings.  LABORATORY DATA: Lab Results  Component Value Date   WBC 7.9 06/21/2015   HGB 10.3* 06/21/2015   HCT 33.0* 06/21/2015   PLT 287.0 06/21/2015   GLUCOSE 116* 06/21/2015   CHOL 130 06/21/2015   TRIG 172.0* 06/21/2015   HDL 52.50 06/21/2015   LDLDIRECT 98.0 06/09/2013   LDLCALC 43 06/21/2015   ALT 12 06/21/2015   AST 15 06/21/2015   NA 133* 06/21/2015   K 3.9 06/21/2015   CL 93* 06/21/2015   CREATININE 0.59 06/21/2015   BUN 15 06/21/2015   CO2 33* 06/21/2015   TSH 1.81 06/09/2014   INR 0.96 05/02/2015   HGBA1C 8.0* 06/09/2014   MICROALBUR 1.9 06/09/2013    Right/Left Heart Cath and Coronary/Graft Angiography    PACS Images    Show images for Cardiac catheterization     Link to Procedure Log    Procedure Log      Indications    Abnormal nuclear stress test [R93.1 (ICD-10-CM)]    Technique and Indications    Indication: 59 yo WF with history of CAD s/p CABG with early graft failure. S/p Taxus stenting x 2 of the LCx and OM1. Presents now with angina and high risk stress myoview. Pre op evaluation for surgery for abdominal mass.  Procedural Details: The right groin was prepped, draped, and anesthetized with 1% lidocaine. Using the modified Seldinger technique a 5 Fr slender sheath was placed in the right femoral artery and a 7 French sheath was placed in the right femoral vein. A Swan-Ganz catheter was used for the right heart catheterization. Standard protocol was followed for  recording of right heart pressures and sampling of oxygen saturations. Fick cardiac output was calculated. Standard Judkins catheters were used for selective coronary angiography and IMA and left ventriculography. There were no immediate procedural complications. The patient was transferred to the post catheterization recovery area for further monitoring.  There were no immediate complications during the procedure.    Conclusion     There is severe left ventricular systolic  dysfunction.  LM lesion, 50% stenosed.  Prox LAD lesion, 90% stenosed.  Mid LAD lesion, 100% stenosed.  1st Diag lesion, 95% stenosed.  Ramus-1 lesion, 75% stenosed.  Ramus-2 lesion, 80% stenosed.  Mid Cx to Dist Cx lesion, 100% stenosed.  Ost Cx to Mid Cx lesion, 99% stenosed. A drug-eluting stent was placed. The lesion was previously treated with a drug-eluting stent greater than two years ago.  Ost 1st Mrg to 1st Mrg lesion, 100% stenosed. A drug-eluting stent was placed. The lesion was previously treated with a drug-eluting stent greater than two years ago.  Ost RCA to Prox RCA lesion, 100% stenosed.  is normal in caliber, and is anatomically normal.  Prox RCA to Mid RCA lesion, 100% stenosed.  Dist RCA lesion, 100% stenosed.  1. Severe 3 vessel obstructive CAD.   - 50% distal left main stenosis.  - 100% occluded LAD, 99% first diagonal  - 75% proximal ramus intermediate. The intermediate bifurcates in the mid vessel and there is an 80% stenosis in the more superior branch  - 100% occlusion of the proximal LCx with diffuse occlusion of the stents in the LCx and first OM. There are left to left collaterals to the distal vessels.  - 100% occlusion of the proximal RCA. Faint right to right collaterals to the mid RCA. Left to right collaterals to the distal RCA.  2. Patent LIMA graft to the LAD  3. Known occlusion of all other bypass grafts from cardiac cath in 2007.  4. Severe LV dysfunction  5. Moderate pulmonary HTN  6. Normal LV filling pressures.   Recommendations: Continue medical management. She is not a candidate for redo CABG due to poor targets and prior early graft failure. She does not have suitable lesions for PCI. Will increase carvedilol to 25 mg daily. She appears to be euvolemic by hemodynamics. Would consider proceeding with plans for surgery to treat abdominal mass. Cardiac risk will be high but she is stable and her medications are  optimized. She may stop Plavix prior to surgical procedures.      Coronary Findings    Dominance: Right   Left Main   . LM lesion, 50% stenosed. calcified discrete . Distal left main     Left Anterior Descending   . Prox LAD lesion, 90% stenosed. calcified diffuse .   [name] Mid LAD lesion, 100% stenosed. chronic total occlusion .   [name] First Diagonal Branch   The vessel is small in size.   . 1st Diag lesion, 95% stenosed. diffuse .     Ramus Intermedius   . Ramus-1 lesion, 75% stenosed. discrete .   [name] Ramus-2 lesion, 80% stenosed.     Left Circumflex   . Ost Cx to Mid Cx lesion, 99% stenosed. diffuse . The lesion was previously treated with a drug-eluting stent greater than two years ago.   . Mid Cx to Dist Cx lesion, 100% stenosed. diffuse .   [name] First Obtuse Marginal Branch   The vessel is small in size. 1st Mrg filled by collaterals from Dist LAD.   [name]  Ost 1st Mrg to 1st Mrg lesion, 100% stenosed. The lesion was previously treated with a drug-eluting stent greater than two years ago.   . Second Obtuse Marginal Branch   The vessel is small in size. 2nd Mrg filled by collaterals from Dist LAD.     Right Coronary Artery  Mid RCA filled by collaterals from Prox RCA.   Colon Flattery RCA to Prox RCA lesion, 100% stenosed. chronic total occlusion .   [name] Prox RCA to Mid RCA lesion, 100% stenosed.   . Dist RCA lesion, 100% stenosed.   . Right Posterior Descending Artery   The vessel is small in size. RPDA filled by collaterals from Dist LAD.   . Right Posterior Atrioventricular Branch   The vessel is small in size.     Graft Angiography    LIMA Graft to Mid LAD  is normal in caliber, and is anatomically normal.           Right Heart Pressures Hemodynamic findings consistent with moderate pulmonary hypertension. LV EDP is normal.    Wall Motion                 Left Heart    Left Ventricle The left ventricle is enlarged. There is severe left ventricular systolic  dysfunction. The left ventricular ejection fraction is 25-35% by visual estimate. There are wall motion abnormalities in the left ventricle. inferior wall akinesis, global hypokinesis There are segmental wall motion abnormalities in the left ventricle.    Coronary Diagrams    Diagnostic Diagram            Implants    Name ID Temporary Type Supply   No information to display    Hemo Data       Most Recent Value   Fick Cardiac Output  2.88 L/min   Fick Cardiac Output Index  1.83 (L/min)/BSA   RA A Wave  6 mmHg   RA V Wave  10 mmHg   RA Mean  6 mmHg   RV Systolic Pressure  41 mmHg   RV Diastolic Pressure  5 mmHg   RV EDP  9 mmHg   PA Systolic Pressure  46 mmHg   PA Diastolic Pressure  10 mmHg   PA Mean  28 mmHg   PW A Wave  11 mmHg   PW V Wave  11 mmHg   PW Mean  9 mmHg   AO Systolic Pressure  740 mmHg   AO Diastolic Pressure  56 mmHg   AO Mean  75 mmHg   LV Systolic Pressure  814 mmHg   LV Diastolic Pressure  8 mmHg   LV EDP  17 mmHg   Arterial Occlusion Pressure Extended Systolic Pressure  481 mmHg   Arterial Occlusion Pressure Extended Diastolic Pressure  69 mmHg   Arterial Occlusion Pressure Extended Mean Pressure  97 mmHg   Left Ventricular Apex Extended Systolic Pressure  856 mmHg   Left Ventricular Apex Extended Diastolic Pressure  5 mmHg   Left Ventricular Apex Extended EDP Pressure  21 mmHg   QP/QS  1   TPVR Index  15.26 HRUI   TSVR Index  40.9 HRUI   PVR SVR Ratio  0.28   TPVR/TSVR Ratio  0.37    Order-Level Documents:    There are no order-level documents.    Encounter-Level Documents - 05/02/15:      Scan on 05/10/2015 10:17 AM by Provider Default, MDScan on 05/10/2015 10:17 AM by Provider Default, MD  Scan on 05/10/2015 9:20 AM by Provider Default, MDScan on 05/10/2015 9:20 AM by Provider Default, MD     Scan on 05/06/2015 2:37 PM by Provider Default, MDScan on 05/06/2015 2:37 PM by Provider Default, MD      Electronic signature on 05/06/2015 11:38 AM    Signed    Electronically signed by Peter M Martinique, MD on 05/06/15 at 1512 EDT   Ecg today shows NSR with rate 77. Old inferior MI. ST-T changes consistent with lateral ischemia. No change from 04/12/15.  Assessment / Plan: 1. CAD S/p CABG. Failed bypass grafts except for LIMA to LAD. S/p remote stenting of LCX and OM with Taxus DES now occluded. Stable class 2 angina. Continue aggressive antianginal Rx. With ASA, plavix, nitrates, coreg, and Ranexa. Stressed importance of smoking cessation. She had no memory of prior cardiac studies results so I spent time reviewing Echo, Myoview, cardiac cath, and carotid doppler study results with her.  2. Carotid arterial disese- moderate, bilateral. Repeat carotid dopplers in July stable. Repeat in January. 3. Tobacco abuse. Recommend complete cessation. Patient states she is going to keep trying to quit and has enrolled in [facility] program.  4. HTN controlled. 5. Chronic combined systolic and diastolic CHF. Severe LV dysfunction. By cath EDP was normal.  Continue lasix. Sodium restriction. 6. Pulmonary HTN. 7. S/p major abdominal surgery for cystadenoma. No complications.

## 2015-09-21 ENCOUNTER — Other Ambulatory Visit: Payer: Self-pay | Admitting: Pharmacist

## 2015-09-21 NOTE — Patient Outreach (Signed)
[facility]) Care Management  09/21/2015  Diane [name] Dec 20, 1955 [id number]   Diane [name] is a 59yo who I am following for smoking cessation. I made outreach call to patient to follow up.  I was unable to reach her. I left a HIPAA complaint voicemail for patient to return my call. I will plan to call patient within one week if she does not return my call.   [name], Pharm.D. Pharmacy Resident [place] [phone]

## 2015-09-27 ENCOUNTER — Other Ambulatory Visit: Payer: Self-pay | Admitting: Pharmacist

## 2015-09-27 NOTE — Patient Outreach (Signed)
[facility]) Care Management  09/27/2015  [name] 01/08/1956 [id number]   Diane [name] is a 59yo who I am following for smoking cessation. I made a second outreach call to patient to follow up. I was unable to reach her. I left a HIPAA complaint voicemail for patient to return my call. I will plan to call patient within one week if she does not return my call.   [name], Pharm.D. Pharmacy Resident [place] [phone]

## 2015-10-04 ENCOUNTER — Encounter: Payer: Self-pay | Admitting: Pharmacist

## 2015-10-04 ENCOUNTER — Other Ambulatory Visit: Payer: Self-pay | Admitting: Pharmacist

## 2015-10-04 NOTE — Patient Outreach (Signed)
[facility]) Care Management  10/04/2015  Diane [name] Mar 30, 1956 [id number]   Diane [name] is a 60yo who I am following for smoking cessation. I made a third outreach call to patient to follow up. I was unable to reach her. I left a HIPAA complaint voicemail for patient to return my call. I will send a patient outreach letter.     [name], Pharm.D. Pharmacy Resident [place] [phone]

## 2015-10-06 ENCOUNTER — Encounter: Payer: Self-pay | Admitting: Internal Medicine

## 2015-10-06 ENCOUNTER — Ambulatory Visit (INDEPENDENT_AMBULATORY_CARE_PROVIDER_SITE_OTHER): Payer: Medicare Other | Admitting: Internal Medicine

## 2015-10-06 ENCOUNTER — Telehealth: Payer: Self-pay | Admitting: Family Medicine

## 2015-10-06 VITALS — BP 114/58 | HR 84 | Temp 98.1°F | Wt 132.5 lb

## 2015-10-06 DIAGNOSIS — J069 Acute upper respiratory infection, unspecified: Secondary | ICD-10-CM

## 2015-10-06 DIAGNOSIS — B9789 Other viral agents as the cause of diseases classified elsewhere: Principal | ICD-10-CM

## 2015-10-06 MED ORDER — HYDROCODONE-HOMATROPINE 5-1.5 MG/5ML PO SYRP: 5.0000 mL | ORAL_SOLUTION | Freq: Three times a day (TID) | ORAL | Status: DC | PRN

## 2015-10-06 NOTE — Telephone Encounter (Signed)
Patient Name: CAMIE HAUSS DOB: September 02, 1956 Initial Comment Caller states c/o cough, low grade fever, abd pain from coughing, unable to sleep Nurse Assessment Nurse: Vallery Sa, RN, [place] Date/Time (Eastern Time): 10/06/2015 1:23:50 PM Confirm and document reason for call. If symptomatic, describe symptoms. ---Caller states she developed a non-productive cough about 3 days ago. She developed upper abdominal pain 2 days ago that she thinks is related to her coughing. No severe struggling to breath. She developed a low grade fever about 2 nights ago. Has the patient traveled out of the country within the last 30 days? ---No Does the patient have any new or worsening symptoms? ---Yes Will a triage be completed? ---Yes Related visit to physician within the last 2 weeks? ---No Does the PT have any chronic conditions? (i.e. diabetes, asthma, etc.) ---Yes List chronic conditions. ---Diabetes, CHF, COPD, Fibromyalgia, high Blood Pressure Is this a behavioral health or substance abuse call? ---No Guidelines Guideline Title Affirmed Question Affirmed Notes Asthma Attack [1] Previous asthma attacks AND [2] this feels like asthma attack Cough - Chronic SEVERE coughing spells (e.g., whooping sound after coughing, vomiting after coughing) Abdominal Pain - Upper [1] MODERATE pain (e.g., interferes with normal activities) AND [2] comes and goes (cramps) AND [3] present > 24 hours (Exception: pain with Vomiting or Diarrhea - see that Guideline) Final Disposition User See Physician within 24 Hours [place], RN, [place] Comments Scheduled for 3:45pm appointment today with Garnette Gunner, NP. Referrals REFERRED TO PCP OFFICE Disagree/Comply: Comply

## 2015-10-06 NOTE — Progress Notes (Signed)
HPI  Pt presents to the clinic today with c/o runny nose and cough. This started 3 days ago. She is blowing clear mucous out of her nose. The cough is nonproductive. The cough is worse at night. Her sides hurt because she has been coughing so much. She denies fever, chills, body aches or shortness of breath. She has tried Mucinex with minimal relief.  She does have a history of seasonal allergies. She takes Zyrtec as needed but has not taken it lately. She does have COPD and takes Albuterol as needed. She has not had sick contacts. She does smoke.  Review of Systems      Past Medical History  Diagnosis Date  . Coronary artery disease     post CABG in 3/07   . Hypertension   . Hyperlipidemia   . Dyslipidemia   . Diabetes mellitus     type 2  . Chronic bronchitis ([place])   . CVA (cerebral infarction) 1993  . Tobacco abuse   . COPD (chronic obstructive pulmonary disease) ([place])   . Stroke ([place]) 1993  . Carotid stenosis   . Systolic murmur     known mild AS and MR  . PONV (postoperative nausea and vomiting)     only once was patient sick in the 1980s   . Myocardial infarction ([place])   . CHF (congestive heart failure) ([place])   . Sleep apnea     used to wear a cpap- not used in 3 years   . Shortness of breath dyspnea     with exertion or when fluid builds up   . Pneumonia     hx of   . Goiter   . GERD (gastroesophageal reflux disease)   . Seizures ([place])     last seizure- 03/2013   . Headache     hx of   . Fibromyalgia   . Anemia     Family History  Problem Relation Age of Onset  . Coronary artery disease    . Heart disease    . Heart disease Mother   . Diabetes Mother   . COPD Mother   . Hyperlipidemia Mother   . Hypertension Mother   . Cancer Father   . Drug abuse Paternal Grandmother   . Stroke Paternal Grandfather   . Cancer Paternal Aunt     lung with mets to brain  . Cancer Paternal Uncle     skin cancer  . Cancer Paternal Uncle     lung/liver to brain  .  Cancer Paternal Uncle     cancer of unknown type    Social History   Social History  . Marital Status: Divorced    Spouse Name: N/A  . Number of Children: N/A  . Years of Education: N/A   Occupational History  . Not on file.   Social History Main Topics  . Smoking status: Current Every Day Smoker -- 1.50 packs/day for 49 years    Types: Cigarettes  . Smokeless tobacco: Never Used     Comment: 1.5 pack to 2 packs per day  . Alcohol Use: No  . Drug Use: No  . Sexual Activity:    Partners: Male    Birth Control/ Protection: None   Other Topics Concern  . Not on file   Social History Narrative    Allergies  Allergen Reactions  . Ace Inhibitors     REACTION: cough  . Bee Venom   . Tetracycline     REACTION: reaction  not known     Constitutional:  Denies headache, fatigue, fever or abrupt weight changes.  HEENT:  Positive runny nose, sore throat. Denies eye redness, eye pain, pressure behind the eyes, facial pain, nasal congestion, ear pain, ringing in the ears, wax buildup, runny nose or bloody nose. Respiratory: Positive cough. Denies difficulty breathing or shortness of breath.  Cardiovascular: Denies chest pain, chest tightness, palpitations or swelling in the hands or feet.   No other specific complaints in a complete review of systems (except as listed in HPI above).  Objective:   BP 114/58 mmHg  Pulse 84  Temp(Src) 98.1 F (36.7 C) (Oral)  Wt 132 lb 8 oz (60.102 kg)  SpO2 97%  LMP 09/02/2003 Wt Readings from Last 3 Encounters:  10/06/15 132 lb 8 oz (60.102 kg)  09/05/15 133 lb (60.328 kg)  07/22/15 124 lb 7 oz (56.444 kg)     General: Appears her stated age, well developed, well nourished in NAD. HEENT: Head: normal shape and size, no sinus tenderness noted; Eyes: sclera white, no icterus, conjunctiva pink; Ears: Tm's gray and intact, normal light reflex; Nose: mucosa pink and moist, septum midline; Throat/Mouth: + PND. Teeth present, mucosa  erythematous and moist, no exudate noted, no lesions or ulcerations noted.  Neck: No cervical lymphadenopathy.  Cardiovascular: Normal rate and rhythm. S1,S2 noted.  No murmur, rubs or gallops noted.  Pulmonary/Chest: Normal effort and positive vesicular breath sounds with bilateral expiratory wheezing. No respiratory distress. No rales or ronchi noted.      Assessment & Plan:   Viral Upper Respiratory Infection:  Get some rest and drink plenty of water Do salt water gargles for the sore throat 80 mg Depo IM for wheezing eRx for Hycodan cough syrup  RTC as needed or if symptoms persist.

## 2015-10-06 NOTE — Progress Notes (Signed)
Pre visit review using our clinic review tool, if applicable. No additional management support is needed unless otherwise documented below in the visit note. 

## 2015-10-06 NOTE — Telephone Encounter (Signed)
Pt has appt 10/06/15 at 3:45 with Avie Echevaria NP.

## 2015-10-06 NOTE — Patient Instructions (Signed)

## 2015-10-10 MED ORDER — METHYLPREDNISOLONE ACETATE 80 MG/ML IJ SUSP
80.0000 mg | Freq: Once | INTRAMUSCULAR | Status: AC
  Administered 2015-10-06: 80 mg via INTRAMUSCULAR

## 2015-10-10 NOTE — Addendum Note (Signed)
Addended by: Lurlean Nanny on: 10/10/2015 04:43 PM   Modules accepted: Orders

## 2015-10-13 ENCOUNTER — Other Ambulatory Visit: Payer: Self-pay

## 2015-10-18 ENCOUNTER — Other Ambulatory Visit: Payer: Self-pay | Admitting: Pharmacist

## 2015-10-18 ENCOUNTER — Encounter: Payer: Self-pay | Admitting: Pharmacist

## 2015-10-18 ENCOUNTER — Ambulatory Visit (HOSPITAL_COMMUNITY)
Admission: RE | Admit: 2015-10-18 | Discharge: 2015-10-18 | Disposition: A | Discharge status: Home / Self Care | Payer: Medicare Other | Source: Ambulatory Visit | Attending: Cardiovascular Disease | Admitting: Cardiovascular Disease

## 2015-10-18 DIAGNOSIS — I10 Essential (primary) hypertension: Secondary | ICD-10-CM | POA: Insufficient documentation

## 2015-10-18 DIAGNOSIS — I739 Peripheral vascular disease, unspecified: Secondary | ICD-10-CM

## 2015-10-18 DIAGNOSIS — E119 Type 2 diabetes mellitus without complications: Secondary | ICD-10-CM | POA: Insufficient documentation

## 2015-10-18 DIAGNOSIS — Z951 Presence of aortocoronary bypass graft: Secondary | ICD-10-CM | POA: Diagnosis not present

## 2015-10-18 DIAGNOSIS — Z8673 Personal history of transient ischemic attack (TIA), and cerebral infarction without residual deficits: Secondary | ICD-10-CM | POA: Diagnosis not present

## 2015-10-18 DIAGNOSIS — E785 Hyperlipidemia, unspecified: Secondary | ICD-10-CM | POA: Diagnosis not present

## 2015-10-18 DIAGNOSIS — I6523 Occlusion and stenosis of bilateral carotid arteries: Secondary | ICD-10-CM | POA: Insufficient documentation

## 2015-10-18 DIAGNOSIS — I5042 Chronic combined systolic (congestive) and diastolic (congestive) heart failure: Secondary | ICD-10-CM | POA: Diagnosis not present

## 2015-10-18 DIAGNOSIS — I779 Disorder of arteries and arterioles, unspecified: Secondary | ICD-10-CM

## 2015-10-18 DIAGNOSIS — I25708 Atherosclerosis of coronary artery bypass graft(s), unspecified, with other forms of angina pectoris: Secondary | ICD-10-CM

## 2015-10-18 NOTE — Patient Outreach (Signed)
[facility]) Care Management  10/18/2015  Diane [name] 12-26-1955 [id number]   [name] is a 60yo who was referred to [facility] for smoking cessation. I made three unsuccessful attempts to contact the patient for follow up via telephone and mailed a patient outreach letter on 10/04/15 with no response.  I will close pharmacy program per protocol at this time.  Will send closure letter to patient and her primary care provider.     [name], Pharm.D. Pharmacy Resident [place] [phone]

## 2015-11-03 ENCOUNTER — Ambulatory Visit (INDEPENDENT_AMBULATORY_CARE_PROVIDER_SITE_OTHER): Payer: Medicare Other | Admitting: Cardiovascular Disease

## 2015-11-03 ENCOUNTER — Encounter: Payer: Self-pay | Admitting: Cardiovascular Disease

## 2015-11-03 VITALS — BP 116/56 | HR 76 | Ht <= 58 in | Wt 133.2 lb

## 2015-11-03 DIAGNOSIS — I739 Peripheral vascular disease, unspecified: Secondary | ICD-10-CM

## 2015-11-03 DIAGNOSIS — D689 Coagulation defect, unspecified: Secondary | ICD-10-CM | POA: Diagnosis not present

## 2015-11-03 DIAGNOSIS — Z01818 Encounter for other preprocedural examination: Secondary | ICD-10-CM

## 2015-11-03 DIAGNOSIS — I779 Disorder of arteries and arterioles, unspecified: Secondary | ICD-10-CM | POA: Diagnosis not present

## 2015-11-03 NOTE — Assessment & Plan Note (Signed)
Ms Hartsock  is a 60 year old female whose mother, [name], was a patient was a patient of mine.she has a history of ischemic heart disease with ischemic cardiopathy. She has remote bypass grafting with occluded grafts chronic angina on dual antibiotic therapy. She's had progression of her carotid disease now with her left internal carotid artery being in the 80% range. She is neurologically symptomatically. I believe she is high risk for endarterectomy and is a good candidate to undergo carotid artery stenting.

## 2015-11-03 NOTE — Patient Instructions (Addendum)
SCHEDULE CAROTID STENTING - DR Annamarie Major and DR BERRY.  STAY ON PLAVIX AND ASPIRIN.  Labs- bmp,pt,ptt,cbc   Your physician recommends that you schedule a follow-up appointment in [place]

## 2015-11-03 NOTE — Progress Notes (Signed)
11/03/2015 Diane [name]   Sep 24, 1956  [id number]  Primary Physician Diane Pardon, MD Primary Cardiologist: Diane Harp MD Diane [name]   HPI:  Diane [name] is a 60 year old Caucasian female with a history of CAD status post coronary artery bypass grafting March 2007 by Dr. Roxan [name]. She's had subsequent stenting and known occluded grafts except for her her LIMA. She has severe LV dysfunction with moderate pulmonary hypertension. Her other Problems include diabetes, hypokalemia, hypertension and ongoing tobacco abuse. She's had progression of her bilateral carotid artery stenosis left greater than right . She is neurologically asymptomatic. She is high risk for carotid endarterectomy.   Current Outpatient Prescriptions  Medication Sig Dispense Refill  . albuterol (PROVENTIL HFA;VENTOLIN HFA) 108 (90 BASE) MCG/ACT inhaler Inhale 2 puffs into the lungs every 6 (six) hours as needed for wheezing or shortness of breath. 1 Inhaler 5  . albuterol (PROVENTIL) (2.5 MG/3ML) 0.083% nebulizer solution USE 1 VIAL PER NEBULIZRE EVERY 6 HOURS AS NEEDED *HOSPITALIST DOESNT DO PA 75 mL 0  . aspirin EC 81 MG tablet Take 1 tablet (81 mg total) by mouth daily. 30 tablet 6  . carvedilol (COREG) 25 MG tablet Take 1 tablet (25 mg total) by mouth 2 (two) times daily with a meal. 180 tablet 3  . clopidogrel (PLAVIX) 75 MG tablet Take 1 tablet (75 mg total) by mouth daily. 90 tablet 2  . cyclobenzaprine (FLEXERIL) 10 MG tablet Take 10 mg by mouth at bedtime.     [name] DEXILANT 60 MG capsule TAKE 1 CAPSULE (60 MG TOTAL) BY MOUTH DAILY. 30 capsule 9  . diphenhydrAMINE (BENADRYL) 25 mg capsule Take 2 capsules in AM and 1 capsule at night 270 capsule 3  . fluticasone (FLONASE) 50 MCG/ACT nasal spray Place 2 sprays into both nostrils daily. 16 g 2  . furosemide (LASIX) 40 MG tablet Take 1 tablet (40 mg total) by mouth daily. 90 tablet 3  . gabapentin (NEURONTIN) 300 MG capsule TAKE 1 CAPSULE (300 MG  TOTAL) BY MOUTH 3 (THREE) TIMES DAILY. 90 capsule 6  . glimepiride (AMARYL) 2 MG tablet Take 2 mg by mouth daily as needed (if sugar is above 140 take additional tab at bedtime).     [name] guaiFENesin (MUCINEX) 600 MG 12 hr tablet Take 2 tablets (1,200 mg total) by mouth 2 (two) times daily. 360 tablet 3  . hydrochlorothiazide (HYDRODIURIL) 50 MG tablet Take 1 tablet (50 mg total) by mouth daily. 30 tablet 5  . HYDROcodone-homatropine (HYCODAN) 5-1.5 MG/5ML syrup Take 5 mLs by mouth every 8 (eight) hours as needed for cough. 120 mL 0  . isosorbide mononitrate (IMDUR) 30 MG 24 hr tablet Take 1 tablet (30 mg total) by mouth 2 (two) times daily. 180 tablet 2  . levETIRAcetam (KEPPRA) 750 MG tablet TAKE 1 TABLET (750 MG TOTAL) BY MOUTH 2 (TWO) TIMES DAILY. 60 tablet 11  . losartan (COZAAR) 100 MG tablet Take 1 tablet (100 mg total) by mouth daily. 90 tablet 3  . metFORMIN (GLUCOPHAGE-XR) 500 MG 24 hr tablet Take 1,000 mg by mouth 2 (two) times daily.  6  . nitroGLYCERIN (NITROSTAT) 0.4 MG SL tablet Place 1 tablet (0.4 mg total) under the tongue every 5 (five) minutes as needed. 25 tablet 11  . phenytoin (DILANTIN) 100 MG ER capsule TAKE 1 CAPSULE (100 MG TOTAL) BY MOUTH 3 (THREE) TIMES DAILY. (Patient taking differently: 1 BID) 90 capsule 11  . potassium chloride (K-DUR) 10 MEQ tablet Take  1 tablet (10 mEq total) by mouth 2 (two) times daily. 180 tablet 1  . ranolazine (RANEXA) 500 MG 12 hr tablet Take 1 tablet (500 mg total) by mouth 2 (two) times daily. 60 tablet 5  . rosuvastatin (CRESTOR) 10 MG tablet Take 1 tablet (10 mg total) by mouth daily. 90 tablet 3  . VOLTAREN 1 % GEL Apply 2 g topically as needed (pain).      No current facility-administered medications for this visit.    Allergies  Allergen Reactions  . Ace Inhibitors     REACTION: cough  . Bee Venom   . Tetracycline     REACTION: reaction not known    Social History   Social History  . Marital Status: Divorced    Spouse Name:  N/A  . Number of Children: N/A  . Years of Education: N/A   Occupational History  . Not on file.   Social History Main Topics  . Smoking status: Current Every Day Smoker -- 1.50 packs/day for 49 years    Types: Cigarettes  . Smokeless tobacco: Never Used     Comment: 1.5 pack to 2 packs per day  . Alcohol Use: No  . Drug Use: No  . Sexual Activity:    Partners: Male    Birth Control/ Protection: None   Other Topics Concern  . Not on file   Social History Narrative     Review of Systems: General: negative for chills, fever, night sweats or weight changes.  Cardiovascular: negative for chest pain, dyspnea on exertion, edema, orthopnea, palpitations, paroxysmal nocturnal dyspnea or shortness of breath Dermatological: negative for rash Respiratory: negative for cough or wheezing Urologic: negative for hematuria Abdominal: negative for nausea, vomiting, diarrhea, bright red blood per rectum, melena, or hematemesis Neurologic: negative for visual changes, syncope, or dizziness All other systems reviewed and are otherwise negative except as noted above.    Blood pressure 116/56, pulse 76, height '4\' 10"'$  (1.473 m), weight 133 lb 3 oz (60.413 kg), last menstrual period 09/02/2003.  General appearance: alert and no distress Neck: no adenopathy, no JVD, supple, symmetrical, trachea midline, thyroid not enlarged, symmetric, no tenderness/mass/nodules and soft bilateral carotid bruits right greater than left Lungs: clear to auscultation bilaterally Heart: regular rate and rhythm, S1, S2 normal, no murmur, click, rub or gallop Extremities: extremities normal, atraumatic, no cyanosis or edema  EKG not performed today  ASSESSMENT AND PLAN:   Carotid artery disease [facility]) Diane [name]  is a 60 year old female whose mother, Diane [name], was a patient was a patient of mine.she has a history of ischemic heart disease with ischemic cardiopathy. She has remote bypass grafting with occluded  grafts chronic angina on dual antibiotic therapy. She's had progression of her carotid disease now with her left internal carotid artery being in the 80% range. She is neurologically symptomatically. I believe she is high risk for endarterectomy and is a good candidate to undergo carotid artery stenting.      Diane Harp MD FACP,FACC,FAHA, [facility] 11/03/2015 2:26 PM

## 2015-11-07 ENCOUNTER — Encounter: Payer: Self-pay | Admitting: Neurology

## 2015-11-09 ENCOUNTER — Telehealth: Payer: Self-pay | Admitting: *Deleted

## 2015-11-09 ENCOUNTER — Encounter: Payer: Self-pay | Admitting: Cardiovascular Disease

## 2015-11-09 NOTE — Telephone Encounter (Signed)
Spoke with patient regarding Carotid Stent procedure that was ordered by Dr. Gwenlyn Found.  Explained procedure is scheduled for Tuesday 11/15/15 at 7:30 am.  Patient told to arrive at Short Stay at [facility] @ 5:30 am---NPO after midnight.  Last dose of Metformin will be this Sunday 11/13/15 and do not take Glimepiride on Tuesday morning.  Be sure to continue ASA and Plavix.  Have blood work done Thursday 11/10/15.  Patient voiced her understanding and will stop by tomorrow 11/10/15 to pick up copy of instructions.  I also left a voice mail for Jeri Modena ([facility] Rep) regarding this case.

## 2015-11-10 ENCOUNTER — Telehealth: Payer: Self-pay | Admitting: *Deleted

## 2015-11-10 DIAGNOSIS — I779 Disorder of arteries and arterioles, unspecified: Secondary | ICD-10-CM | POA: Diagnosis not present

## 2015-11-10 DIAGNOSIS — Z01818 Encounter for other preprocedural examination: Secondary | ICD-10-CM | POA: Diagnosis not present

## 2015-11-10 DIAGNOSIS — D689 Coagulation defect, unspecified: Secondary | ICD-10-CM | POA: Diagnosis not present

## 2015-11-10 LAB — CBC
HCT: 35.7 % — ABNORMAL LOW (ref 36.0–46.0)
Hemoglobin: 10.7 g/dL — ABNORMAL LOW (ref 12.0–15.0)
MCH: 20.5 pg — ABNORMAL LOW (ref 26.0–34.0)
MCHC: 30 g/dL (ref 30.0–36.0)
MCV: 68.3 fL — ABNORMAL LOW (ref 78.0–100.0)
MPV: 9.3 fL (ref 8.6–12.4)
Platelets: 250 10*3/uL (ref 150–400)
RBC: 5.23 MIL/uL — ABNORMAL HIGH (ref 3.87–5.11)
RDW: 17.5 % — ABNORMAL HIGH (ref 11.5–15.5)
WBC: 5.6 10*3/uL (ref 4.0–10.5)

## 2015-11-10 NOTE — Telephone Encounter (Signed)
Spoke with Rodman Key regarding carotid stent that is scheduled for Tuesday 11/15/15 at 7:30 am at [facility] with Dr. Gwenlyn Found.  Rodman Key voiced his understanding.

## 2015-11-11 ENCOUNTER — Telehealth: Payer: Self-pay | Admitting: Cardiovascular Disease

## 2015-11-11 LAB — BASIC METABOLIC PANEL
BUN: 13 mg/dL (ref 7–25)
CO2: 29 mmol/L (ref 20–31)
Calcium: 9.6 mg/dL (ref 8.6–10.4)
Chloride: 94 mmol/L — ABNORMAL LOW (ref 98–110)
Creat: 0.74 mg/dL (ref 0.50–0.99)
Glucose, Bld: 234 mg/dL — ABNORMAL HIGH (ref 65–99)
Potassium: 3.7 mmol/L (ref 3.5–5.3)
Sodium: 135 mmol/L (ref 135–146)

## 2015-11-11 LAB — PROTIME-INR
INR: 1.05 (ref <1.50)
Prothrombin Time: 13.8 seconds (ref 11.6–15.2)

## 2015-11-11 LAB — APTT: aPTT: 35 seconds (ref 24–37)

## 2015-11-11 NOTE — Telephone Encounter (Signed)
New Message  Pt called states that she is supposed to have the procedure on the 14 th of Feb. Pt states that she has followed the instructions to have blood work with [facility] however the location where she was supposed to get the chest xray did not have orders, please call back to discuss.

## 2015-11-11 NOTE — Telephone Encounter (Signed)
Communicated w/ patient - instructions for CXR on checkout sheet. She had recent CXR within past 6 mos, no repeat order needed. Pt voiced understanding.

## 2015-11-14 ENCOUNTER — Other Ambulatory Visit: Payer: Self-pay | Admitting: *Deleted

## 2015-11-14 ENCOUNTER — Encounter: Payer: Self-pay | Admitting: Cardiology

## 2015-11-14 DIAGNOSIS — Z01818 Encounter for other preprocedural examination: Secondary | ICD-10-CM

## 2015-11-14 DIAGNOSIS — I739 Peripheral vascular disease, unspecified: Secondary | ICD-10-CM

## 2015-11-14 DIAGNOSIS — I779 Disorder of arteries and arterioles, unspecified: Secondary | ICD-10-CM

## 2015-11-15 ENCOUNTER — Ambulatory Visit (HOSPITAL_COMMUNITY)
Admission: RE | Admit: 2015-11-15 | Discharge: 2015-11-15 | Disposition: A | Discharge status: Home / Self Care | Payer: Medicare Other | Source: Ambulatory Visit | Attending: Surgery | Admitting: Surgery

## 2015-11-15 ENCOUNTER — Encounter (HOSPITAL_COMMUNITY): Payer: Self-pay | Admitting: Cardiovascular Disease

## 2015-11-15 ENCOUNTER — Encounter (HOSPITAL_COMMUNITY)
Admission: RE | Disposition: A | Discharge status: Home / Self Care | Payer: Self-pay | Source: Ambulatory Visit | Attending: Surgery

## 2015-11-15 DIAGNOSIS — E876 Hypokalemia: Secondary | ICD-10-CM | POA: Insufficient documentation

## 2015-11-15 DIAGNOSIS — I6522 Occlusion and stenosis of left carotid artery: Secondary | ICD-10-CM | POA: Diagnosis not present

## 2015-11-15 DIAGNOSIS — I272 Other secondary pulmonary hypertension: Secondary | ICD-10-CM | POA: Insufficient documentation

## 2015-11-15 DIAGNOSIS — I6523 Occlusion and stenosis of bilateral carotid arteries: Secondary | ICD-10-CM | POA: Diagnosis not present

## 2015-11-15 DIAGNOSIS — Z7902 Long term (current) use of antithrombotics/antiplatelets: Secondary | ICD-10-CM | POA: Diagnosis not present

## 2015-11-15 DIAGNOSIS — Z01818 Encounter for other preprocedural examination: Secondary | ICD-10-CM

## 2015-11-15 DIAGNOSIS — E119 Type 2 diabetes mellitus without complications: Secondary | ICD-10-CM | POA: Insufficient documentation

## 2015-11-15 DIAGNOSIS — Z955 Presence of coronary angioplasty implant and graft: Secondary | ICD-10-CM | POA: Diagnosis not present

## 2015-11-15 DIAGNOSIS — F17210 Nicotine dependence, cigarettes, uncomplicated: Secondary | ICD-10-CM | POA: Diagnosis not present

## 2015-11-15 DIAGNOSIS — Z7982 Long term (current) use of aspirin: Secondary | ICD-10-CM | POA: Diagnosis not present

## 2015-11-15 DIAGNOSIS — I739 Peripheral vascular disease, unspecified: Secondary | ICD-10-CM

## 2015-11-15 DIAGNOSIS — I259 Chronic ischemic heart disease, unspecified: Secondary | ICD-10-CM | POA: Diagnosis not present

## 2015-11-15 DIAGNOSIS — I25810 Atherosclerosis of coronary artery bypass graft(s) without angina pectoris: Secondary | ICD-10-CM | POA: Diagnosis not present

## 2015-11-15 DIAGNOSIS — Z7984 Long term (current) use of oral hypoglycemic drugs: Secondary | ICD-10-CM | POA: Insufficient documentation

## 2015-11-15 DIAGNOSIS — I779 Disorder of arteries and arterioles, unspecified: Secondary | ICD-10-CM

## 2015-11-15 HISTORY — PX: PERIPHERAL VASCULAR CATHETERIZATION: SHX172C

## 2015-11-15 SURGERY — CAROTID PTA/STENT INTERVENTION
Anesthesia: LOCAL

## 2015-11-15 MED ORDER — NOREPINEPHRINE BITARTRATE 1 MG/ML IV SOLN
INTRAVENOUS | Status: AC
  Filled 2015-11-15: qty 4

## 2015-11-15 MED ORDER — SODIUM CHLORIDE 0.9% FLUSH: 3.0000 mL | Freq: Two times a day (BID) | INTRAVENOUS | Status: DC

## 2015-11-15 MED ORDER — HEPARIN (PORCINE) IN NACL 2-0.9 UNIT/ML-% IJ SOLN
INTRAMUSCULAR | Status: AC
  Filled 2015-11-15: qty 1000

## 2015-11-15 MED ORDER — ASPIRIN 81 MG PO CHEW
81.0000 mg | CHEWABLE_TABLET | ORAL | Status: AC
  Administered 2015-11-15: 81 mg via ORAL

## 2015-11-15 MED ORDER — SODIUM CHLORIDE 0.9 % IV SOLN
INTRAVENOUS | Status: DC
  Administered 2015-11-15: 1000 mL via INTRAVENOUS

## 2015-11-15 MED ORDER — BIVALIRUDIN 250 MG IV SOLR
INTRAVENOUS | Status: AC
  Filled 2015-11-15: qty 250

## 2015-11-15 MED ORDER — ASPIRIN 81 MG PO CHEW
CHEWABLE_TABLET | ORAL | Status: AC
  Administered 2015-11-15: 81 mg via ORAL
  Filled 2015-11-15: qty 1

## 2015-11-15 MED ORDER — LIDOCAINE HCL (PF) 1 % IJ SOLN
INTRAMUSCULAR | Status: AC
  Filled 2015-11-15: qty 30

## 2015-11-15 MED ORDER — LIDOCAINE HCL (PF) 1 % IJ SOLN
INTRAMUSCULAR | Status: DC | PRN
  Administered 2015-11-15: 25 mL

## 2015-11-15 MED ORDER — IODIXANOL 320 MG/ML IV SOLN
INTRAVENOUS | Status: DC | PRN
  Administered 2015-11-15: 110 mL via INTRAVENOUS

## 2015-11-15 SURGICAL SUPPLY — 11 items
CATH ANGIO 5F JB1 100CM (CATHETERS) ×1 IMPLANT
CATH ANGIO 5F PIGTAIL 100CM (CATHETERS) ×1 IMPLANT
DEVICE CLOSURE MYNXGRIP 6/7F (Vascular Products) ×1 IMPLANT
KIT MICROINTRODUCER STIFF 5F (SHEATH) ×1 IMPLANT
KIT PV (KITS) ×3 IMPLANT
SHEATH PINNACLE 6F 10CM (SHEATH) ×1 IMPLANT
SYR MEDRAD MARK V 150ML (SYRINGE) ×3 IMPLANT
TRANSDUCER W/STOPCOCK (MISCELLANEOUS) ×3 IMPLANT
TRAY PV CATH (CUSTOM PROCEDURE TRAY) ×3 IMPLANT
WIRE BENTSON .035X145CM (WIRE) ×1 IMPLANT
WIRE HITORQ VERSACORE ST 145CM (WIRE) ×1 IMPLANT

## 2015-11-15 NOTE — Interval H&P Note (Signed)
History and Physical Interval Note:  11/15/2015 7:43 AM  Diane [name]  has presented today for surgery, with the diagnosis of carotid stenosis  The various methods of treatment have been discussed with the patient and family. After consideration of risks, benefits and other options for treatment, the patient has consented to  Procedure(s): Carotid PTA/Stent Intervention (N/A) as a surgical intervention .  The patient's history has been reviewed, patient examined, no change in status, stable for surgery.  I have reviewed the patient's chart and labs.  Questions were answered to the patient's satisfaction.     Quay Burow

## 2015-11-15 NOTE — H&P (View-Only) (Signed)
11/03/2015 Diane [name]   November 15, 1955  [id number]  Primary Physician Diane Pardon, MD Primary Cardiologist: Diane Harp MD Diane [name]   HPI:  Diane [name] is a 60 year old Caucasian female with a history of CAD status post coronary artery bypass grafting March 2007 by Dr. Roxan [name]. She's had subsequent stenting and known occluded grafts except for her her LIMA. She has severe LV dysfunction with moderate pulmonary hypertension. Her other Problems include diabetes, hypokalemia, hypertension and ongoing tobacco abuse. She's had progression of her bilateral carotid artery stenosis left greater than right . She is neurologically asymptomatic. She is high risk for carotid endarterectomy.   Current Outpatient Prescriptions  Medication Sig Dispense Refill  . albuterol (PROVENTIL HFA;VENTOLIN HFA) 108 (90 BASE) MCG/ACT inhaler Inhale 2 puffs into the lungs every 6 (six) hours as needed for wheezing or shortness of breath. 1 Inhaler 5  . albuterol (PROVENTIL) (2.5 MG/3ML) 0.083% nebulizer solution USE 1 VIAL PER NEBULIZRE EVERY 6 HOURS AS NEEDED *HOSPITALIST DOESNT DO PA 75 mL 0  . aspirin EC 81 MG tablet Take 1 tablet (81 mg total) by mouth daily. 30 tablet 6  . carvedilol (COREG) 25 MG tablet Take 1 tablet (25 mg total) by mouth 2 (two) times daily with a meal. 180 tablet 3  . clopidogrel (PLAVIX) 75 MG tablet Take 1 tablet (75 mg total) by mouth daily. 90 tablet 2  . cyclobenzaprine (FLEXERIL) 10 MG tablet Take 10 mg by mouth at bedtime.     [name] DEXILANT 60 MG capsule TAKE 1 CAPSULE (60 MG TOTAL) BY MOUTH DAILY. 30 capsule 9  . diphenhydrAMINE (BENADRYL) 25 mg capsule Take 2 capsules in AM and 1 capsule at night 270 capsule 3  . fluticasone (FLONASE) 50 MCG/ACT nasal spray Place 2 sprays into both nostrils daily. 16 g 2  . furosemide (LASIX) 40 MG tablet Take 1 tablet (40 mg total) by mouth daily. 90 tablet 3  . gabapentin (NEURONTIN) 300 MG capsule TAKE 1 CAPSULE (300 MG  TOTAL) BY MOUTH 3 (THREE) TIMES DAILY. 90 capsule 6  . glimepiride (AMARYL) 2 MG tablet Take 2 mg by mouth daily as needed (if sugar is above 140 take additional tab at bedtime).     [name] guaiFENesin (MUCINEX) 600 MG 12 hr tablet Take 2 tablets (1,200 mg total) by mouth 2 (two) times daily. 360 tablet 3  . hydrochlorothiazide (HYDRODIURIL) 50 MG tablet Take 1 tablet (50 mg total) by mouth daily. 30 tablet 5  . HYDROcodone-homatropine (HYCODAN) 5-1.5 MG/5ML syrup Take 5 mLs by mouth every 8 (eight) hours as needed for cough. 120 mL 0  . isosorbide mononitrate (IMDUR) 30 MG 24 hr tablet Take 1 tablet (30 mg total) by mouth 2 (two) times daily. 180 tablet 2  . levETIRAcetam (KEPPRA) 750 MG tablet TAKE 1 TABLET (750 MG TOTAL) BY MOUTH 2 (TWO) TIMES DAILY. 60 tablet 11  . losartan (COZAAR) 100 MG tablet Take 1 tablet (100 mg total) by mouth daily. 90 tablet 3  . metFORMIN (GLUCOPHAGE-XR) 500 MG 24 hr tablet Take 1,000 mg by mouth 2 (two) times daily.  6  . nitroGLYCERIN (NITROSTAT) 0.4 MG SL tablet Place 1 tablet (0.4 mg total) under the tongue every 5 (five) minutes as needed. 25 tablet 11  . phenytoin (DILANTIN) 100 MG ER capsule TAKE 1 CAPSULE (100 MG TOTAL) BY MOUTH 3 (THREE) TIMES DAILY. (Patient taking differently: 1 BID) 90 capsule 11  . potassium chloride (K-DUR) 10 MEQ tablet Take  1 tablet (10 mEq total) by mouth 2 (two) times daily. 180 tablet 1  . ranolazine (RANEXA) 500 MG 12 hr tablet Take 1 tablet (500 mg total) by mouth 2 (two) times daily. 60 tablet 5  . rosuvastatin (CRESTOR) 10 MG tablet Take 1 tablet (10 mg total) by mouth daily. 90 tablet 3  . VOLTAREN 1 % GEL Apply 2 g topically as needed (pain).      No current facility-administered medications for this visit.    Allergies  Allergen Reactions  . Ace Inhibitors     REACTION: cough  . Bee Venom   . Tetracycline     REACTION: reaction not known    Social History   Social History  . Marital Status: Divorced    Spouse Name:  N/A  . Number of Children: N/A  . Years of Education: N/A   Occupational History  . Not on file.   Social History Main Topics  . Smoking status: Current Every Day Smoker -- 1.50 packs/day for 49 years    Types: Cigarettes  . Smokeless tobacco: Never Used     Comment: 1.5 pack to 2 packs per day  . Alcohol Use: No  . Drug Use: No  . Sexual Activity:    Partners: Male    Birth Control/ Protection: None   Other Topics Concern  . Not on file   Social History Narrative     Review of Systems: General: negative for chills, fever, night sweats or weight changes.  Cardiovascular: negative for chest pain, dyspnea on exertion, edema, orthopnea, palpitations, paroxysmal nocturnal dyspnea or shortness of breath Dermatological: negative for rash Respiratory: negative for cough or wheezing Urologic: negative for hematuria Abdominal: negative for nausea, vomiting, diarrhea, bright red blood per rectum, melena, or hematemesis Neurologic: negative for visual changes, syncope, or dizziness All other systems reviewed and are otherwise negative except as noted above.    Blood pressure 116/56, pulse 76, height '4\' 10"'$  (1.473 m), weight 133 lb 3 oz (60.413 kg), last menstrual period 09/02/2003.  General appearance: alert and no distress Neck: no adenopathy, no JVD, supple, symmetrical, trachea midline, thyroid not enlarged, symmetric, no tenderness/mass/nodules and soft bilateral carotid bruits right greater than left Lungs: clear to auscultation bilaterally Heart: regular rate and rhythm, S1, S2 normal, no murmur, click, rub or gallop Extremities: extremities normal, atraumatic, no cyanosis or edema  EKG not performed today  ASSESSMENT AND PLAN:   [facility] Diane [name]  is a 60 year old female whose mother, Diane [name], was a patient was a patient of mine.she has a history of ischemic heart disease with ischemic cardiopathy. She has remote bypass grafting with occluded  grafts chronic angina on dual antibiotic therapy. She's had progression of her carotid disease now with her left internal carotid artery being in the 80% range. She is neurologically symptomatically. I believe she is high risk for endarterectomy and is a good candidate to undergo carotid artery stenting.      Diane Harp MD FACP,FACC,FAHA, [facility] 11/03/2015 2:26 PM

## 2015-11-15 NOTE — Discharge Instructions (Signed)
Do not retsart Metformin until Sat 11-18-15  Angiogram, Care After These instructions give you information about caring for yourself after your procedure. Your doctor may also give you more specific instructions. Call your doctor if you have any problems or questions after your procedure.  HOME CARE  Take medicines only as told by your doctor.  Follow your doctor's instructions about:  Care of the area where the tube was inserted.  Bandage (dressing) changes and removal.  You may shower 24-48 hours after the procedure or as told by your doctor.  Do not take baths, swim, or use a hot tub until your doctor approves.  Every day, check the area where the tube was inserted. Watch for:  Redness, swelling, or pain.  Fluid, blood, or pus.  Do not apply powder or lotion to the site.  Do not lift anything that is heavier than 10 lb (4.5 kg) for 5 days or as told by your doctor.  Ask your doctor when you can:  Return to work or school.  Do physical activities or play sports.  Have sex.  Do not drive or operate heavy machinery for 24 hours or as told by your doctor.  Have someone with you for the first 24 hours after the procedure.  Keep all follow-up visits as told by your doctor. This is important. GET HELP IF:  You have a fever.   You have chills.   You have more bleeding from the area where the tube was inserted. Hold pressure on the area.  You have redness, swelling, or pain in the area where the tube was inserted.  You have fluid or pus coming from the area. GET HELP RIGHT AWAY IF:   You have a lot of pain in the area where the tube was inserted.  The area where the tube was inserted is bleeding, and the bleeding does not stop after 30 minutes of holding steady pressure on the area.  The area near or just beyond the insertion site becomes pale, cool, tingly, or numb.   This information is not intended to replace advice given to you by your health care provider.  Make sure you discuss any questions you have with your health care provider.   Document Released: 12/14/2008 Document Revised: 10/08/2014 Document Reviewed: 02/18/2013 Elsevier Interactive Patient Education [employer].

## 2015-11-16 ENCOUNTER — Encounter: Payer: Self-pay | Admitting: Cardiovascular Disease

## 2015-11-16 MED FILL — Heparin Sodium (Porcine) 2 Unit/ML in Sodium Chloride 0.9%: INTRAMUSCULAR | Qty: 500 | Status: AC

## 2015-11-16 MED FILL — Norepinephrine Bitartrate IV Soln 1 MG/ML (Base Equivalent): INTRAVENOUS | Qty: 4 | Status: AC

## 2015-11-28 ENCOUNTER — Telehealth: Payer: Self-pay | Admitting: Family Medicine

## 2015-11-28 NOTE — Telephone Encounter (Signed)
I will see her then  

## 2015-11-28 NOTE — Telephone Encounter (Signed)
Patient Name: Diane [name]  DOB: [birth year]    Initial Comment Caller states she has been having spells of dizziness, also light headed and weak, recently diagnosed as anemic but more tests needed   Nurse Assessment  Nurse: Raphael Gibney, RN, Vanita Ingles Date/Time (Eastern Time): 11/28/2015 12:35:41 PM  Confirm and document reason for call. If symptomatic, describe symptoms. You must click the next button to save text entered. ---Caller states she has been diagnosed with anemia recently. She is light headed and she is weak. Dizziness has been going on for a couple of months.  Has the patient traveled out of the country within the last 30 days? ---No  Does the patient have any new or worsening symptoms? ---Yes  Will a triage be completed? ---Yes  Related visit to physician within the last 2 weeks? ---No  Does the PT have any chronic conditions? (i.e. diabetes, asthma, etc.) ---Yes  List chronic conditions. ---HTN: diabetes; heart problems. CHF  Is this a behavioral health or substance abuse call? ---No     Guidelines    Guideline Title Affirmed Question Affirmed Notes  Dizziness - Lightheadedness [1] MODERATE dizziness (e.g., interferes with normal activities) AND [2] has NOT been evaluated by physician for this (Exception: dizziness caused by heat exposure, sudden standing, or poor fluid intake)    Final Disposition User   See Physician within 24 Hours [place], RN, [place]    Comments  Dr. Glori Bickers does not have anything available within 24 hrs and pt does not want to see another doctor. She wants appt later on the week with Dr. Glori Bickers. Please call pt back and let her know about appt.   Referrals  REFERRED TO PCP OFFICE   Disagree/Comply: Comply

## 2015-11-28 NOTE — Telephone Encounter (Signed)
Pt said not having symptoms now; dizziness comes and goes. Pt scheduled with Dr Glori Bickers 11/29/15 at 8:45 am. If pt condition changes or worsens will go to UC for eval.

## 2015-11-29 ENCOUNTER — Encounter: Payer: Self-pay | Admitting: Family Medicine

## 2015-11-29 ENCOUNTER — Ambulatory Visit (INDEPENDENT_AMBULATORY_CARE_PROVIDER_SITE_OTHER): Payer: Medicare Other | Admitting: Family Medicine

## 2015-11-29 VITALS — BP 128/62 | HR 98 | Temp 96.8°F | Ht <= 58 in | Wt 134.5 lb

## 2015-11-29 DIAGNOSIS — Z8673 Personal history of transient ischemic attack (TIA), and cerebral infarction without residual deficits: Secondary | ICD-10-CM | POA: Insufficient documentation

## 2015-11-29 DIAGNOSIS — R42 Dizziness and giddiness: Secondary | ICD-10-CM | POA: Diagnosis not present

## 2015-11-29 DIAGNOSIS — D649 Anemia, unspecified: Secondary | ICD-10-CM

## 2015-11-29 LAB — FERRITIN: Ferritin: 3.7 ng/mL — ABNORMAL LOW (ref 10.0–291.0)

## 2015-11-29 LAB — IBC PANEL
Iron: 13 ug/dL — ABNORMAL LOW (ref 42–145)
Saturation Ratios: 2 % — ABNORMAL LOW (ref 20.0–50.0)
Transferrin: 468 mg/dL — ABNORMAL HIGH (ref 212.0–360.0)

## 2015-11-29 LAB — VITAMIN B12: Vitamin B-12: 277 pg/mL (ref 211–911)

## 2015-11-29 NOTE — Progress Notes (Signed)
Pre visit review using our clinic review tool, if applicable. No additional management support is needed unless otherwise documented below in the visit note. 

## 2015-11-29 NOTE — Progress Notes (Signed)
Subjective:    Patient ID: Diane [name], female    DOB: December 28, 1955, 60 y.o.   MRN: [id number]  HPI Here with dizziness   Light headed/off balance for about 3 months Only when moving (fine when sitting still) Also some nausea More light headed than spinning  "need to sit down before she falls down"  Just had carotid angiography - and 70% stenosis both sides  No amendable to stenting  Does not think she is having more strokes   Last visit to neuro  Has f/u in May (chart says June)  Since last here -no more seizures or strokes   Last mri head 2014- small stroke   BP Readings from Last 3 Encounters:  11/29/15 128/62  11/15/15 127/72  11/03/15 116/56   Does wonder if her blood sugar is dropping  Never has meter with her when this happens   Patient Active Problem List   Diagnosis Date Noted  . History of CVA (cerebrovascular accident) 11/29/2015  . Anemia 11/29/2015  . Wound cellulitis after surgery 05/30/2015  . Pelvic mass in female 05/19/2015  . Pelvic mass   . Abnormal nuclear stress test 05/06/2015  . Chronic combined systolic and diastolic CHF (congestive heart failure) ([place]) 04/19/2015  . Abdominal mass 03/28/2015  . 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 of localized onset, not intractable, without status epilepticus ([place]) 03/22/2015  . Cervical disc disorder with radiculopathy of cervical region 03/22/2015  . Complex partial seizures ([place]) 09/16/2013  . Encounter for Medicare annual wellness exam 06/16/2013  . Routine general medical examination at a health care facility 06/08/2013  . Seizures ([place]) 03/25/2013  . Goiter 09/01/2012  . Other screening mammogram 06/13/2012  . Allergic rhinitis 06/13/2012  . Chronic bronchitis ([place]) 04/02/2012  . Hyponatremia 12/11/2011  . Carotid artery disease ([place]) 08/20/2011  . Other dysphagia 09/14/2010  . DIZZINESS 07/26/2010  . OBSTRUCTIVE SLEEP APNEA 09/18/2007  . TOBACCO USE  08/08/2007  . DIABETES MELLITUS, TYPE II 03/26/2007  . Hyperlipidemia 03/26/2007  . ANXIETY 03/26/2007  . DEPRESSION 03/26/2007  . MIGRAINE HEADACHE 03/26/2007  . CARPAL TUNNEL SYNDROME, BILATERAL 03/26/2007  . Essential hypertension 03/26/2007  . Coronary atherosclerosis 03/26/2007  . CVA 03/26/2007  . GERD 03/26/2007  . FIBROMYALGIA 03/26/2007   Past Medical History  Diagnosis Date  . Coronary artery disease     post CABG in 3/07   . Hypertension   . Hyperlipidemia   . Dyslipidemia   . Diabetes mellitus     type 2  . Chronic bronchitis ([place])   . CVA (cerebral infarction) 1993  . Tobacco abuse   . COPD (chronic obstructive pulmonary disease) ([place])   . Stroke ([employer]) 1993  . Carotid stenosis   . Systolic murmur     known mild AS and MR  . PONV (postoperative nausea and vomiting)     only once was patient sick in the 1980s   . Myocardial infarction ([place])   . CHF (congestive heart failure) ([place])   . Sleep apnea     used to wear a cpap- not used in 3 years   . Shortness of breath dyspnea     with exertion or when fluid builds up   . Pneumonia     hx of   . Goiter   . GERD (gastroesophageal reflux disease)   . Seizures ([place])     last seizure- 03/2013   . Headache     hx of   . Fibromyalgia   .  Anemia    Past Surgical History  Procedure Laterality Date  . Coronary stent placement  08/11/06    PCI of her ciurcumflex/OM vessel  . Cardiac catheterization  11/29/05    EF of 55%  . Cardiac catheterization  08/06/06    EF of 45-50%  . Coronary artery bypass graft  12/04/2005    x5 -- left internal mammary artery to the LAD, left radial artery to the ramus intermedius, saphenous vein graft to the obtuse marginal 1, sequential saphenous vein grat to the acute marginal and posterior descending, endoscopic vein harvesting from the left thigh with open vein harvest from right leg  . Cholecystectomy    . Appendectomy    . Cervical fusion  1990  . Cardiac catheterization N/A  05/06/2015    Procedure: Right/Left Heart Cath and Coronary/Graft Angiography;  Surgeon: Peter M Martinique, MD;  Location: [facility];  Service: Cardiovascular;  Laterality: N/A;  . Laparotomy Bilateral 05/19/2015    Procedure: EXPLORATORY LAPAROTOMY WITH BILATERAL SALPINGO OOPHORECTOMY /OMENTECTOMY/SEGMENTAL SIGMOID COLECTOMY ;  Surgeon: Everitt Amber, MD;  Location: [facility];  Service: Gynecology;  Laterality: Bilateral;  . Peripheral vascular catheterization N/A 11/15/2015    Procedure: Carotid PTA/Stent Intervention;  Surgeon: Lorretta Harp, MD;  Location: [name];  Service: Cardiovascular;  Laterality: N/A;  . Peripheral vascular catheterization  11/15/2015    Procedure: Carotid Angiography;  Surgeon: Lorretta Harp, MD;  Location: [facility];  Service: Cardiovascular;;   Social History  Substance Use Topics  . Smoking status: Current Every Day Smoker -- 1.50 packs/day for 49 years    Types: Cigarettes  . Smokeless tobacco: Never Used     Comment: 1.5 pack to 2 packs per day  . Alcohol Use: No   Family History  Problem Relation Age of Onset  . Coronary artery disease    . Heart disease    . Heart disease Mother   . Diabetes Mother   . COPD Mother   . Hyperlipidemia Mother   . Hypertension Mother   . Cancer Father   . Drug abuse Paternal Grandmother   . Stroke Paternal Grandfather   . Cancer Paternal Aunt     lung with mets to brain  . Cancer Paternal Uncle     skin cancer  . Cancer Paternal Uncle     lung/liver to brain  . Cancer Paternal Uncle     cancer of unknown type   Allergies  Allergen Reactions  . Ace Inhibitors     REACTION: cough  . Bee Venom Itching and Swelling  . Tetracycline     REACTION: reaction not known   Current Outpatient Prescriptions on File Prior to Visit  Medication Sig Dispense Refill  . albuterol (PROVENTIL HFA;VENTOLIN HFA) 108 (90 BASE) MCG/ACT inhaler Inhale 2 puffs into the lungs every 6 (six) hours as needed for  wheezing or shortness of breath. 1 Inhaler 5  . albuterol (PROVENTIL) (2.5 MG/3ML) 0.083% nebulizer solution USE 1 VIAL PER NEBULIZRE EVERY 6 HOURS AS NEEDED *HOSPITALIST DOESNT DO PA 75 mL 0  . aspirin EC 81 MG tablet Take 1 tablet (81 mg total) by mouth daily. 30 tablet 6  . carvedilol (COREG) 25 MG tablet Take 1 tablet (25 mg total) by mouth 2 (two) times daily with a meal. 180 tablet 3  . clopidogrel (PLAVIX) 75 MG tablet Take 1 tablet (75 mg total) by mouth daily. 90 tablet 2  . cyclobenzaprine (FLEXERIL) 10 MG tablet  Take 10 mg by mouth at bedtime.     [name] DEXILANT 60 MG capsule TAKE 1 CAPSULE (60 MG TOTAL) BY MOUTH DAILY. 30 capsule 9  . diphenhydrAMINE (BENADRYL) 25 mg capsule Take 2 capsules in AM and 1 capsule at night (Patient taking differently: Take 25-50 mg by mouth 2 (two) times daily. Take 2 capsules in AM and 1 capsule at night) 270 capsule 3  . fluticasone (FLONASE) 50 MCG/ACT nasal spray Place 2 sprays into both nostrils daily. 16 g 2  . furosemide (LASIX) 40 MG tablet Take 1 tablet (40 mg total) by mouth daily. 90 tablet 3  . gabapentin (NEURONTIN) 300 MG capsule TAKE 1 CAPSULE (300 MG TOTAL) BY MOUTH 3 (THREE) TIMES DAILY. (Patient taking differently: TAKE 2 CAPSULES BY MOUTH EVERY EVENING) 90 capsule 6  . glimepiride (AMARYL) 2 MG tablet Take 2 mg by mouth 2 (two) times daily.     [name] guaiFENesin (MUCINEX) 600 MG 12 hr tablet Take 2 tablets (1,200 mg total) by mouth 2 (two) times daily. 360 tablet 3  . hydrochlorothiazide (HYDRODIURIL) 50 MG tablet Take 1 tablet (50 mg total) by mouth daily. 30 tablet 5  . HYDROcodone-homatropine (HYCODAN) 5-1.5 MG/5ML syrup Take 5 mLs by mouth every 8 (eight) hours as needed for cough. 120 mL 0  . isosorbide mononitrate (IMDUR) 30 MG 24 hr tablet Take 1 tablet (30 mg total) by mouth 2 (two) times daily. 180 tablet 2  . levETIRAcetam (KEPPRA) 750 MG tablet TAKE 1 TABLET (750 MG TOTAL) BY MOUTH 2 (TWO) TIMES DAILY. 60 tablet 11  . losartan (COZAAR)  100 MG tablet Take 1 tablet (100 mg total) by mouth daily. 90 tablet 3  . metFORMIN (GLUCOPHAGE-XR) 500 MG 24 hr tablet Take 1,000 mg by mouth 2 (two) times daily.  6  . nicotine (NICODERM CQ - DOSED IN MG/24 HOURS) 21 mg/24hr patch Place 21 mg onto the skin daily.    . nitroGLYCERIN (NITROSTAT) 0.4 MG SL tablet Place 1 tablet (0.4 mg total) under the tongue every 5 (five) minutes as needed. 25 tablet 11  . phenytoin (DILANTIN) 100 MG ER capsule TAKE 1 CAPSULE (100 MG TOTAL) BY MOUTH 3 (THREE) TIMES DAILY. (Patient taking differently: TAKE 1 CAPSULE BY MOUTH TWICE A DAY) 90 capsule 11  . potassium chloride (K-DUR) 10 MEQ tablet Take 1 tablet (10 mEq total) by mouth 2 (two) times daily. 180 tablet 1  . ranolazine (RANEXA) 500 MG 12 hr tablet Take 1 tablet (500 mg total) by mouth 2 (two) times daily. 60 tablet 5  . rosuvastatin (CRESTOR) 10 MG tablet Take 1 tablet (10 mg total) by mouth daily. 90 tablet 3  . VOLTAREN 1 % GEL Apply 2 g topically as needed (pain).      No current facility-administered medications on file prior to visit.     Review of Systems Review of Systems  Constitutional: Negative for fever, appetite change, and unexpected weight change. pos for fatigue Eyes: Negative for pain and visual disturbance.  Respiratory: Negative for cough and shortness of breath.  (baseline sob from smoking)  Cardiovascular: Negative for cp or palpitations    Gastrointestinal: Negative for , diarrhea and constipation. pos for nausea  Genitourinary: Negative for urgency and frequency.  Skin: Negative for pallor or rash   Neurological: Negative for weakness, numbness and headaches. pos for dizziness and instability Hematological: Negative for adenopathy. Does not bruise/bleed easily.  Psychiatric/Behavioral: Negative for dysphoric mood. The patient is  nervous/anxious.  Objective:   Physical Exam  Constitutional: She is oriented to person, place, and time. She appears well-developed and  well-nourished. No distress.  overwt and well app  HENT:  Head: Normocephalic and atraumatic.  Right Ear: External ear normal.  Left Ear: External ear normal.  Nose: Nose normal.  Mouth/Throat: Oropharynx is clear and moist. No oropharyngeal exudate.  No sinus tenderness No temporal tenderness  No TMJ tenderness  Eyes: Conjunctivae and EOM are normal. Pupils are equal, round, and reactive to light. Right eye exhibits no discharge. Left eye exhibits no discharge. No scleral icterus.  No nystagmus  Neck: Normal range of motion and full passive range of motion without pain. Neck supple. No JVD present. Carotid bruit is present. No tracheal deviation present. No thyromegaly present.  Cardiovascular: Normal rate, regular rhythm, normal heart sounds and intact distal pulses.  Exam reveals no gallop.   No murmur heard. Pulmonary/Chest: Effort normal and breath sounds normal. No respiratory distress. She has no wheezes. She has no rales.  No crackles  Abdominal: Soft. Bowel sounds are normal. She exhibits no distension, no abdominal bruit and no mass. There is no tenderness.  Musculoskeletal: She exhibits no edema or tenderness.  Lymphadenopathy:    She has no cervical adenopathy.  Neurological: She is alert and oriented to person, place, and time. She has normal strength and normal reflexes. She displays no atrophy and no tremor. No cranial nerve deficit or sensory deficit. She exhibits normal muscle tone. She displays no seizure activity. Gait abnormal. Coordination normal.  Ataxic on tandem gait  Some backward drift on [place]   Skin: Skin is warm and dry. No rash noted. No pallor.  Psychiatric: She has a normal mood and affect. Her behavior is normal. Thought content normal.  Pleasant and talkative          Assessment & Plan:   Problem List Items Addressed This Visit      Other   Anemia    Anemic - iron labs and B12 today  Unsure if this may add to her dizziness        Relevant Orders   Ferritin (Completed)   Vitamin B12 (Completed)   IBC panel (Completed)   DIZZINESS - Primary    In stroke/ vascular pt who had recent carotid angiography (70 % stenosis bilat)  This could be multifactorial MRI of brain ordered  Disc safety and fall prev  Also anemic - iron lab today         Relevant Orders   MR Brain Wo Contrast   History of CVA (cerebrovascular accident)    With recently worsened non focal dizziness (with movement)  MRI ordered       Relevant Orders   MR Brain Wo Contrast

## 2015-11-29 NOTE — Patient Instructions (Addendum)
Start checking glucose whenever you feel light headed -let's see if it is getting too low Labs for anemia today  Stop at check out - to plan MRI to make sure nothing is new from a brain/ stroke perspective    If symptoms suddenly worsen let me know  We may need to get you in sooner with neurology   Call your abdominal surgeon if the nausea gets worse or if you get new abdominal symptoms

## 2015-12-01 ENCOUNTER — Encounter: Payer: Self-pay | Admitting: Family Medicine

## 2015-12-01 DIAGNOSIS — D509 Iron deficiency anemia, unspecified: Secondary | ICD-10-CM | POA: Insufficient documentation

## 2015-12-01 DIAGNOSIS — E538 Deficiency of other specified B group vitamins: Secondary | ICD-10-CM | POA: Insufficient documentation

## 2015-12-01 NOTE — Assessment & Plan Note (Signed)
Anemic - iron labs and B12 today  Unsure if this may add to her dizziness

## 2015-12-01 NOTE — Assessment & Plan Note (Signed)
In stroke/ vascular pt who had recent carotid angiography (70 % stenosis bilat)  This could be multifactorial MRI of brain ordered  Disc safety and fall prev  Also anemic - iron lab today

## 2015-12-01 NOTE — Assessment & Plan Note (Signed)
With recently worsened non focal dizziness (with movement)  MRI ordered

## 2015-12-06 ENCOUNTER — Ambulatory Visit (INDEPENDENT_AMBULATORY_CARE_PROVIDER_SITE_OTHER): Payer: Medicare Other | Admitting: *Deleted

## 2015-12-06 DIAGNOSIS — E538 Deficiency of other specified B group vitamins: Secondary | ICD-10-CM | POA: Diagnosis not present

## 2015-12-06 MED ORDER — CYANOCOBALAMIN 1000 MCG/ML IJ SOLN
1000.0000 ug | Freq: Once | INTRAMUSCULAR | Status: AC
  Administered 2015-12-06: 1000 ug via INTRAMUSCULAR

## 2015-12-08 ENCOUNTER — Ambulatory Visit
Admission: RE | Admit: 2015-12-08 | Discharge: 2015-12-08 | Disposition: A | Discharge status: Home / Self Care | Payer: Medicare Other | Source: Ambulatory Visit | Attending: Family Medicine | Admitting: Family Medicine

## 2015-12-08 DIAGNOSIS — Z8673 Personal history of transient ischemic attack (TIA), and cerebral infarction without residual deficits: Secondary | ICD-10-CM

## 2015-12-08 DIAGNOSIS — R42 Dizziness and giddiness: Secondary | ICD-10-CM | POA: Diagnosis not present

## 2015-12-13 ENCOUNTER — Ambulatory Visit (INDEPENDENT_AMBULATORY_CARE_PROVIDER_SITE_OTHER): Payer: Medicare Other | Admitting: Neurology

## 2015-12-13 ENCOUNTER — Encounter: Payer: Self-pay | Admitting: Neurology

## 2015-12-13 ENCOUNTER — Other Ambulatory Visit (INDEPENDENT_AMBULATORY_CARE_PROVIDER_SITE_OTHER): Payer: Medicare Other

## 2015-12-13 VITALS — BP 100/66 | HR 96 | Ht <= 58 in | Wt 134.0 lb

## 2015-12-13 DIAGNOSIS — G40009 Localization-related (focal) (partial) idiopathic epilepsy and epileptic syndromes with seizures of localized onset, not intractable, without status epilepticus: Secondary | ICD-10-CM | POA: Diagnosis not present

## 2015-12-13 DIAGNOSIS — F17200 Nicotine dependence, unspecified, uncomplicated: Secondary | ICD-10-CM

## 2015-12-13 DIAGNOSIS — I779 Disorder of arteries and arterioles, unspecified: Secondary | ICD-10-CM | POA: Diagnosis not present

## 2015-12-13 DIAGNOSIS — M542 Cervicalgia: Secondary | ICD-10-CM

## 2015-12-13 DIAGNOSIS — I679 Cerebrovascular disease, unspecified: Secondary | ICD-10-CM

## 2015-12-13 DIAGNOSIS — I25708 Atherosclerosis of coronary artery bypass graft(s), unspecified, with other forms of angina pectoris: Secondary | ICD-10-CM

## 2015-12-13 DIAGNOSIS — I10 Essential (primary) hypertension: Secondary | ICD-10-CM

## 2015-12-13 DIAGNOSIS — E1142 Type 2 diabetes mellitus with diabetic polyneuropathy: Secondary | ICD-10-CM

## 2015-12-13 DIAGNOSIS — Z794 Long term (current) use of insulin: Secondary | ICD-10-CM

## 2015-12-13 DIAGNOSIS — E785 Hyperlipidemia, unspecified: Secondary | ICD-10-CM

## 2015-12-13 DIAGNOSIS — I739 Peripheral vascular disease, unspecified: Secondary | ICD-10-CM

## 2015-12-13 LAB — ALBUMIN: Albumin: 4.2 g/dL (ref 3.5–5.2)

## 2015-12-13 MED ORDER — PHENYTOIN SODIUM EXTENDED 100 MG PO CAPS: 100.0000 mg | ORAL_CAPSULE | Freq: Two times a day (BID) | ORAL | Status: DC

## 2015-12-13 MED ORDER — GABAPENTIN 300 MG PO CAPS: 600.0000 mg | ORAL_CAPSULE | Freq: Every day | ORAL | Status: DC

## 2015-12-13 NOTE — Progress Notes (Signed)
NEUROLOGY FOLLOW UP OFFICE NOTE  Diane [name] [id number]  HISTORY OF PRESENT ILLNESS: Diane [name] is a 60 year old right-handed woman with CAD s/p CABG, hypertension, hyperlipidemia, type II diabetes mellitus, tobacco use, stroke, depression, fibromyalgia, migraine, OSA and COPD who follows up for neck pain and seizure disorder, as well as incidental strokes on recent MRI.  History obtained by patient and friend and PCP note.  Labs and imaging of brain MRI reviewed.   UPDATE: Seizure disorder: She is doing well. Medications:  Dilantin ER '100mg'$  twice daily, Keppra '750mg'$  twice daily, calcium '1500mg'$  daily, vitamin D 1000 IU daily Last spell:  03/18/13.  Neck pain: She is taking gabapentin '300mg'$  three times daily.  CAD/Carotid artery disease/Cerebrovascular disease: Echo and Nuclear study performed in July 2016 showed EF 35%.  Catheterization performed in August 2016 showed severe left ventricular systolic dysfunction with severe 3 vessel occlusion.  She is not a candidate for another CABG and therefore continues on medical management.  Coreg was increased.  Carotid doppler from 10/18/15 showed progression, with left ICA stenosis greater than 80%.  She was to undergo carotid stenting last month, but was determined not to be a candidate. Fasting lipid panel from 06/21/15 showed cholesterol 130, TG 172, HDL 52 and LDL 43.  LFTs were normal. Last Hgb A1c was from 06/09/14 and was 8%.  However, she is followed by an out of network endocrinologist, and it is controlled (last A1c was around 6%). She is on ASA '81mg'$  and Plavix '75mg'$  daily.  She is on Crestor '10mg'$  daily.  She had been having dizziness, described as lightheadedness and associated with movement.  She was found to have iron-deficiency anemia and B12 deficiency. Labs (11/29/15):  WBC 5.6, HGB 10.7, HCT 35.7, PLT 250, Na 135, K 3.7, Cl 94, CO2 29, glucose 234, BUN 13, Cr 0.74, Ca 9.6, B12 277, ferritin 3.7. MRI of brain without contrast from  12/08/15 showed chronic right MCA infarct but also showed chronic looking lacunar infarcts in the bilateral basal ganglia and left thalamus, which are new compared to prior imaging from June 2014. She was initiated on B12 and iron supplementation.  HISTORY: She was admitted to [facility] from 03/03/13 to 03/06/13 for recurrent seizure-like episodes.  These spells are were witnessed by Dr. Wallie Char, neuro-hospitalist, and described as "head turning to the right as well as eye deviation to the right, stiffening of right extremities with flexion of right upper extremity, and convulsive movements of right extremities. Spell lasted about 40-50 seconds and resolved without intervention. Within 30 seconds patient was again awake and conversant. She was slightly confused with respect to time."  She is unable to speak during these events.  Sometimes she was aware during these events and other times she was unaware and amnestic.  She did have urinary incontinence but no tongue biting.  It is reported that she can write down what she wants to say during these spells.  Given her stroke history, she was evaluated for possible left hemispheric stroke.  By his assessment, it seemed that her speech difficulties were non-organic, seizure-like activity non-organic, and he did not suspect acute stroke.    She had continuous EEG monitoring, which captured multiple spells without electrographic correlate.  However, she did have one captured clinical event, described as "build up to rhythmical theta activity and resultant slowing companied by clinical changes.  This may very well represent epileptiform activity.  No interictal abnormalities were noted".  Psychiatry was consulted,  regarding possible pseudoseizures.  Despite the possible diagnosis of pseudoseizures, the neuro-hospitalists never made a sole diagnosis of pseudoseizures in the hospital notes.    I personally reviewed the EEG.  At 1:57:48, patient pauses,  head slightly turned left, staring, with little movement and no convulsions.  She does pull at the covers a little, but no appreciable stereotypical hand movements.  When it is over, she begins moving around again.  Electrographically, she does have a build up of generalized theta activity up until 1:58:26, followed by generalized delta slowing lasting until 1:58:45.  Difficult to lateralize, but may be more prominent in the left hemisphere.  However, this appears to only be a clip of the event and the rest of the study is not available to me.  Therefore I cannot comment on the other captured spells.  MRI of brain performed on 03/03/13, which reported possible tiny acute infarct in the posterior left frontal lobe, but neurology believed this to be artifact.  There was also mention of incidental tiny cysts in right hippocampal region.  Encephalomalacia in the right frontoparietal region, from prior remote stroke, is seen.    She has history of right sided neck pain radiating down to the right shoulder, associated with tingling from the right ear down to the shoulder.  Hot and cold compresses help too.  No numbness, pain or weakness down the arm.  Pain worse when turning neck to the left.  She has trigger finger and numbness in the fingers when driving or picking up objects, thought to be carpal tunnel syndrome.  She has history of cervical fusion in the 1980s.  PT was ineffective.  [name]  MRI of the cervical spine performed on 05/23/14 showed moderately large foraminal disc protrusion and associated osteophyte at C3-4, causing right foraminal encroachment.  Solid fusion C5 through C7 noted.  She was referred for right C4 nerve root injection, however it was not done.   PAST MEDICAL HISTORY: Past Medical History  Diagnosis Date  . Coronary artery disease     post CABG in 3/07   . Hypertension   . Hyperlipidemia   . Dyslipidemia   . Diabetes mellitus     type 2  . Chronic bronchitis ([place])   . CVA (cerebral  infarction) 1993  . Tobacco abuse   . COPD (chronic obstructive pulmonary disease) ([place])   . Stroke ([place]) 1993  . Carotid stenosis   . Systolic murmur     known mild AS and MR  . PONV (postoperative nausea and vomiting)     only once was patient sick in the 1980s   . Myocardial infarction ([place])   . CHF (congestive heart failure) ([place])   . Sleep apnea     used to wear a cpap- not used in 3 years   . Shortness of breath dyspnea     with exertion or when fluid builds up   . Pneumonia     hx of   . Goiter   . GERD (gastroesophageal reflux disease)   . Seizures ([place])     last seizure- 03/2013   . Headache     hx of   . Fibromyalgia   . Anemia     MEDICATIONS: Current Outpatient Prescriptions on File Prior to Visit  Medication Sig Dispense Refill  . albuterol (PROVENTIL HFA;VENTOLIN HFA) 108 (90 BASE) MCG/ACT inhaler Inhale 2 puffs into the lungs every 6 (six) hours as needed for wheezing or shortness of breath. 1 Inhaler 5  .  albuterol (PROVENTIL) (2.5 MG/3ML) 0.083% nebulizer solution USE 1 VIAL PER NEBULIZRE EVERY 6 HOURS AS NEEDED *HOSPITALIST DOESNT DO PA 75 mL 0  . aspirin EC 81 MG tablet Take 1 tablet (81 mg total) by mouth daily. 30 tablet 6  . carvedilol (COREG) 25 MG tablet Take 1 tablet (25 mg total) by mouth 2 (two) times daily with a meal. 180 tablet 3  . clopidogrel (PLAVIX) 75 MG tablet Take 1 tablet (75 mg total) by mouth daily. 90 tablet 2  . cyclobenzaprine (FLEXERIL) 10 MG tablet Take 10 mg by mouth at bedtime.     [name] DEXILANT 60 MG capsule TAKE 1 CAPSULE (60 MG TOTAL) BY MOUTH DAILY. 30 capsule 9  . diphenhydrAMINE (BENADRYL) 25 mg capsule Take 2 capsules in AM and 1 capsule at night (Patient taking differently: Take 25-50 mg by mouth 2 (two) times daily. Take 2 capsules in AM and 1 capsule at night) 270 capsule 3  . fluticasone (FLONASE) 50 MCG/ACT nasal spray Place 2 sprays into both nostrils daily. 16 g 2  . furosemide (LASIX) 40 MG tablet Take 1 tablet (40 mg  total) by mouth daily. 90 tablet 3  . glimepiride (AMARYL) 2 MG tablet Take 2 mg by mouth 2 (two) times daily.     [name] guaiFENesin (MUCINEX) 600 MG 12 hr tablet Take 2 tablets (1,200 mg total) by mouth 2 (two) times daily. 360 tablet 3  . hydrochlorothiazide (HYDRODIURIL) 50 MG tablet Take 1 tablet (50 mg total) by mouth daily. 30 tablet 5  . HYDROcodone-homatropine (HYCODAN) 5-1.5 MG/5ML syrup Take 5 mLs by mouth every 8 (eight) hours as needed for cough. 120 mL 0  . isosorbide mononitrate (IMDUR) 30 MG 24 hr tablet Take 1 tablet (30 mg total) by mouth 2 (two) times daily. 180 tablet 2  . levETIRAcetam (KEPPRA) 750 MG tablet TAKE 1 TABLET (750 MG TOTAL) BY MOUTH 2 (TWO) TIMES DAILY. 60 tablet 11  . losartan (COZAAR) 100 MG tablet Take 1 tablet (100 mg total) by mouth daily. 90 tablet 3  . metFORMIN (GLUCOPHAGE-XR) 500 MG 24 hr tablet Take 1,000 mg by mouth 2 (two) times daily.  6  . nicotine (NICODERM CQ - DOSED IN MG/24 HOURS) 21 mg/24hr patch Place 21 mg onto the skin daily.    . nitroGLYCERIN (NITROSTAT) 0.4 MG SL tablet Place 1 tablet (0.4 mg total) under the tongue every 5 (five) minutes as needed. 25 tablet 11  . potassium chloride (K-DUR) 10 MEQ tablet Take 1 tablet (10 mEq total) by mouth 2 (two) times daily. 180 tablet 1  . ranolazine (RANEXA) 500 MG 12 hr tablet Take 1 tablet (500 mg total) by mouth 2 (two) times daily. 60 tablet 5  . rosuvastatin (CRESTOR) 10 MG tablet Take 1 tablet (10 mg total) by mouth daily. 90 tablet 3  . VOLTAREN 1 % GEL Apply 2 g topically as needed (pain).      No current facility-administered medications on file prior to visit.    ALLERGIES: Allergies  Allergen Reactions  . Ace Inhibitors     REACTION: cough  . Bee Venom Itching and Swelling  . Tetracycline     REACTION: reaction not known    FAMILY HISTORY: Family History  Problem Relation Age of Onset  . Coronary artery disease    . Heart disease    . Heart disease Mother   . Diabetes Mother     . COPD Mother   . Hyperlipidemia Mother   .  Hypertension Mother   . Cancer Father   . Drug abuse Paternal Grandmother   . Stroke Paternal Grandfather   . Cancer Paternal Aunt     lung with mets to brain  . Cancer Paternal Uncle     skin cancer  . Cancer Paternal Uncle     lung/liver to brain  . Cancer Paternal Uncle     cancer of unknown type    SOCIAL HISTORY: Social History   Social History  . Marital Status: Divorced    Spouse Name: N/A  . Number of Children: N/A  . Years of Education: N/A   Occupational History  . Not on file.   Social History Main Topics  . Smoking status: Current Every Day Smoker -- 1.50 packs/day for 49 years    Types: Cigarettes  . Smokeless tobacco: Never Used     Comment: 1.5 pack to 2 packs per day  . Alcohol Use: No  . Drug Use: No  . Sexual Activity:    Partners: Male    Birth Control/ Protection: None   Other Topics Concern  . Not on file   Social History Narrative    REVIEW OF SYSTEMS: Constitutional: No fevers, chills, or sweats, no generalized fatigue, change in appetite Eyes: No visual changes, double vision, eye pain Ear, nose and throat: No hearing loss, ear pain, nasal congestion, sore throat Cardiovascular: No chest pain, palpitations Respiratory:  No shortness of breath at rest or with exertion, wheezes GastrointestinaI: No nausea, vomiting, diarrhea, abdominal pain, fecal incontinence Genitourinary:  No dysuria, urinary retention or frequency Musculoskeletal:  No neck pain, back pain Integumentary: No rash, pruritus, skin lesions Neurological: as above Psychiatric: No depression, insomnia, anxiety Endocrine: No palpitations, fatigue, diaphoresis, mood swings, change in appetite, change in weight, increased thirst Hematologic/Lymphatic:  No anemia, purpura, petechiae. Allergic/Immunologic: no itchy/runny eyes, nasal congestion, recent allergic reactions, rashes  PHYSICAL EXAM: Filed Vitals:   12/13/15 1311  BP:  100/66  Pulse: 96   General: No acute distress.   Head:  Normocephalic/atraumatic Eyes:  Fundi examined but not visualized Neck: supple, no paraspinal tenderness, full range of motion Heart:  Regular rate and rhythm Lungs:  Clear to auscultation bilaterally Back: No paraspinal tenderness Neurological Exam: alert and oriented to person, place, and time. Attention span and concentration intact, recent and remote memory intact, fund of knowledge intact.  Speech fluent and not dysarthric, language intact.  CN II-XII intact. Bulk and tone normal, muscle strength 5/5 throughout.  Decreased vibration sensation in feet.  Temperature sensation intact.  Deep tendon reflexes 3+ throughout, toes downgoing.  Finger to nose and heel to shin testing intact.  Gait normal, Romberg positive  IMPRESSION: Localization-related epilepsy with complex partial seizures, stable Cervical radiculopathy, controlled Cerebrovascular disease with old lacunes, new compared to last imaging from 2014.  They are punctate and not much different than other chronic microvascular ischemic changes.  Likely incidental Dizziness, rather lightheadedness.  May be related to anemia. Hyperlipidemia Hypertension Type 2 diabetes mellitus Carotid artery disease Tobacco use  PLAN: 1.  Continue Keppra '750mg'$  twice daily 2.  Continue Dilantin ER '100mg'$  twice daily along with calcium '1500mg'$  daily and D3 1000 units daily 3.  Continue gabapentin '600mg'$  at bedtime for cervical radicular pain 4.  Continue aspirin '81mg'$  daily and Plavix '75mg'$  daily 5.  Continue Crestor '10mg'$  daily 6.  Check phenytoin level and albumin level 7.  Quit smoking 8.  Mediterranean diet 9.  BP control 10. Glycemic control 11.  Follow  up in one year (or as needed), recheck CBC with diff and CMP at that time.  Metta Clines, DO  CC:  Loura Pardon, MD

## 2015-12-13 NOTE — Patient Instructions (Addendum)
1.  Continue Keppra '750mg'$  twice daily 2.  Continue Dilantin ER '100mg'$  twice daily along with calcium '1500mg'$  daily and D3 1000 units daily 3.  Continue gabapentin '600mg'$  at bedtime 4.  Continue aspirin '81mg'$  daily and Plavix '75mg'$  daily 5.  Continue Crestor '10mg'$  daily 6.  Check phenytoin level and albumin level 7.  Quit smoking 8.  Mediterranean diet    Why follow it? Research shows. . Those who follow the Mediterranean diet have a reduced risk of heart disease  . The diet is associated with a reduced incidence of Parkinson's and Alzheimer's diseases . People following the diet may have longer life expectancies and lower rates of chronic diseases  . The Dietary Guidelines for Americans recommends the Mediterranean diet as an eating plan to promote health and prevent disease  What Is the Mediterranean Diet?  . Healthy eating plan based on typical foods and recipes of Mediterranean-style cooking . The diet is primarily a plant based diet; these foods should make up a majority of meals   Starches - Plant based foods should make up a majority of meals - They are an important sources of vitamins, minerals, energy, antioxidants, and fiber - Choose whole grains, foods high in fiber and minimally processed items  - Typical grain sources include wheat, oats, barley, corn, brown rice, bulgar, farro, millet, polenta, couscous  - Various types of beans include chickpeas, lentils, fava beans, black beans, white beans   Fruits  Veggies - Large quantities of antioxidant rich fruits & veggies; 6 or more servings  - Vegetables can be eaten raw or lightly drizzled with oil and cooked  - Vegetables common to the traditional Mediterranean Diet include: artichokes, arugula, beets, broccoli, brussel sprouts, cabbage, carrots, celery, collard greens, cucumbers, eggplant, kale, leeks, lemons, lettuce, mushrooms, okra, onions, peas, peppers, potatoes, pumpkin, radishes, rutabaga, shallots, spinach, sweet potatoes,  turnips, zucchini - Fruits common to the Mediterranean Diet include: apples, apricots, avocados, cherries, clementines, dates, figs, grapefruits, grapes, melons, nectarines, oranges, peaches, pears, pomegranates, strawberries, tangerines  Fats - Replace butter and margarine with healthy oils, such as olive oil, canola oil, and tahini  - Limit nuts to no more than a handful a day  - Nuts include walnuts, almonds, pecans, pistachios, pine nuts  - Limit or avoid candied, honey roasted or heavily salted nuts - Olives are central to the [employer] - can be eaten whole or used in a variety of dishes   Meats Protein - Limiting red meat: no more than a few times a month - When eating red meat: choose lean cuts and keep the portion to the size of deck of cards - Eggs: approx. 0 to 4 times a week  - Fish and lean poultry: at least 2 a week  - Healthy protein sources include, chicken, [place], lean beef, lamb - Increase intake of seafood such as tuna, salmon, trout, mackerel, shrimp, scallops - Avoid or limit high fat processed meats such as sausage and bacon  Dairy - Include moderate amounts of low fat dairy products  - Focus on healthy dairy such as fat free yogurt, skim milk, low or reduced fat cheese - Limit dairy products higher in fat such as whole or 2% milk, cheese, ice cream  Alcohol - Moderate amounts of red wine is ok  - No more than 5 oz daily for women (all ages) and men older than age 43  - No more than 10 oz of wine daily for men younger than 26  Other - Limit sweets and other desserts  - Use herbs and spices instead of salt to flavor foods  - Herbs and spices common to the traditional Mediterranean Diet include: basil, bay leaves, chives, cloves, cumin, fennel, garlic, lavender, marjoram, mint, oregano, parsley, pepper, rosemary, sage, savory, sumac, tarragon, thyme   It's not just a diet, it's a lifestyle:  . The Mediterranean diet includes lifestyle factors typical of those in  the region  . Foods, drinks and meals are best eaten with others and savored . Daily physical activity is important for overall good health . This could be strenuous exercise like running and aerobics . This could also be more leisurely activities such as walking, housework, yard-work, or taking the stairs . Moderation is the key; a balanced and healthy diet accommodates most foods and drinks . Consider portion sizes and frequency of consumption of certain foods   Meal Ideas & Options:  . Breakfast:  o Whole wheat toast or whole wheat English muffins with peanut butter & hard boiled egg o Steel cut oats topped with apples & cinnamon and skim milk  o Fresh fruit: banana, strawberries, melon, berries, peaches  o Smoothies: strawberries, bananas, greek yogurt, peanut butter o Low fat greek yogurt with blueberries and granola  o Egg white omelet with spinach and mushrooms o Breakfast couscous: whole wheat couscous, apricots, skim milk, cranberries  . Sandwiches:  o Hummus and grilled vegetables (peppers, zucchini, squash) on whole wheat bread   o Grilled chicken on whole wheat pita with lettuce, tomatoes, cucumbers or tzatziki  o Tuna salad on whole wheat bread: tuna salad made with greek yogurt, olives, red peppers, capers, green onions o Garlic rosemary lamb pita: lamb sauted with garlic, rosemary, salt & pepper; add lettuce, cucumber, greek yogurt to pita - flavor with lemon juice and black pepper  . Seafood:  o Mediterranean grilled salmon, seasoned with garlic, basil, parsley, lemon juice and black pepper o Shrimp, lemon, and spinach whole-grain pasta salad made with low fat greek yogurt  o Seared scallops with lemon orzo  o Seared tuna steaks seasoned salt, pepper, coriander topped with tomato mixture of olives, tomatoes, olive oil, minced garlic, parsley, green onions and cappers  . Meats:  o Herbed greek chicken salad with kalamata olives, cucumber, feta  o Red bell peppers stuffed  with spinach, bulgur, lean ground beef (or lentils) & topped with feta   o Kebabs: skewers of chicken, tomatoes, onions, zucchini, squash  o [place] burgers: made with red onions, mint, dill, lemon juice, feta cheese topped with roasted red peppers . Vegetarian o Cucumber salad: cucumbers, artichoke hearts, celery, red onion, feta cheese, tossed in olive oil & lemon juice  o Hummus and whole grain pita points with a greek salad (lettuce, tomato, feta, olives, cucumbers, red onion) o Lentil soup with celery, carrots made with vegetable broth, garlic, salt and pepper  o Tabouli salad: parsley, bulgur, mint, scallions, cucumbers, tomato, radishes, lemon juice, olive oil, salt and pepper. 9.  Follow up in one year or as needed.  Check CBC with diff and CMP at that time

## 2015-12-14 LAB — PHENYTOIN LEVEL, TOTAL: Phenytoin Lvl: <2.5 ug/mL — ABNORMAL LOW (ref 10.0–20.0)

## 2016-01-13 ENCOUNTER — Other Ambulatory Visit: Payer: Self-pay

## 2016-01-13 ENCOUNTER — Other Ambulatory Visit: Payer: Self-pay | Admitting: *Deleted

## 2016-01-13 MED ORDER — RANOLAZINE ER 500 MG PO TB12: 500.0000 mg | ORAL_TABLET | Freq: Two times a day (BID) | ORAL | Status: DC

## 2016-01-13 MED ORDER — HYDROCHLOROTHIAZIDE 50 MG PO TABS: 50.0000 mg | ORAL_TABLET | Freq: Every day | ORAL | Status: DC

## 2016-01-26 ENCOUNTER — Ambulatory Visit (INDEPENDENT_AMBULATORY_CARE_PROVIDER_SITE_OTHER): Payer: Medicare Other | Admitting: Cardiology

## 2016-01-26 ENCOUNTER — Encounter: Payer: Self-pay | Admitting: Cardiology

## 2016-01-26 VITALS — BP 102/62 | HR 82 | Ht <= 58 in | Wt 132.8 lb

## 2016-01-26 DIAGNOSIS — I5042 Chronic combined systolic (congestive) and diastolic (congestive) heart failure: Secondary | ICD-10-CM

## 2016-01-26 DIAGNOSIS — I25708 Atherosclerosis of coronary artery bypass graft(s), unspecified, with other forms of angina pectoris: Secondary | ICD-10-CM | POA: Diagnosis not present

## 2016-01-26 DIAGNOSIS — I779 Disorder of arteries and arterioles, unspecified: Secondary | ICD-10-CM | POA: Diagnosis not present

## 2016-01-26 DIAGNOSIS — E785 Hyperlipidemia, unspecified: Secondary | ICD-10-CM

## 2016-01-26 DIAGNOSIS — I739 Peripheral vascular disease, unspecified: Principal | ICD-10-CM

## 2016-01-26 NOTE — Patient Instructions (Signed)
We will schedule you an appointment to talk with our pharm D  Continue your current therapy  I will see you in 3 months.

## 2016-01-27 NOTE — Progress Notes (Signed)
Diane [name] Date of Birth: [birth year] Medical Record #[id number]  History of Present Illness: Diane [name] is seen for follow up CAD. She has a history of coronary disease and is status post CABG in March of 2007 by Dr. [name]. She presented later that year with recurrent angina. Repeat cardiac cath showed patent LIMA to the LAD but all other grafts occluded including SVG to OM, SVG to AC/PL, and radial graft to ramus intermediate. She then had complex stenting of the mid LCx and first OM with Taxus stents. The native RCA was occluded with collaterals.  Last year she was evaluated for abdominal surgery. Myoview abnormal. Cardiac cath as noted above but now stents in LCx and OM occluded. No suitable targets for PCI or surgery. Severe LV dysfunction with moderate pulmonary HTN and normal LV filling pressures. She has multiple cardiac risk factors including diabetes, dyslipidemia, hypertension, and tobacco abuse. She also has  bilateral carotid arterial disease. She continues to smoke.   In August 2016 she had removal of a large pelvic mass with BSO and sigmoid colectomy. Path c/w ovarian cystadenoma. No complications.  She describes angina about 5 x/ week. Relieved with sl NTG x 1. Appetite is good. Denies SOB. No edema. She was seen by Dr. Gwenlyn Found for her carotid disease. Angiogram performed with findings of 70% bilateral stenoses. The LICA was heavily calcified. Both arteries had acute angulation after the stenosis making it less favorable for stenting. In March she developed dizziness, lightheadedness, and imbalance. Dr. Glori Bickers ordered an MRI that showed an old right MCA infarct and chronic lacunar infarcts that were new from 2014. She states she is interested in taking Entresto.   Current Outpatient Prescriptions on File Prior to Visit  Medication Sig Dispense Refill  . albuterol (PROVENTIL HFA;VENTOLIN HFA) 108 (90 BASE) MCG/ACT inhaler Inhale 2 puffs into the lungs every 6 (six) hours as needed for  wheezing or shortness of breath. 1 Inhaler 5  . albuterol (PROVENTIL) (2.5 MG/3ML) 0.083% nebulizer solution USE 1 VIAL PER NEBULIZRE EVERY 6 HOURS AS NEEDED *HOSPITALIST DOESNT DO PA 75 mL 0  . aspirin EC 81 MG tablet Take 1 tablet (81 mg total) by mouth daily. 30 tablet 6  . carvedilol (COREG) 25 MG tablet Take 1 tablet (25 mg total) by mouth 2 (two) times daily with a meal. 180 tablet 3  . clopidogrel (PLAVIX) 75 MG tablet Take 1 tablet (75 mg total) by mouth daily. 90 tablet 2  . cyclobenzaprine (FLEXERIL) 10 MG tablet Take 10 mg by mouth at bedtime.     [name] DEXILANT 60 MG capsule TAKE 1 CAPSULE (60 MG TOTAL) BY MOUTH DAILY. 30 capsule 9  . diphenhydrAMINE (BENADRYL) 25 mg capsule Take 2 capsules in AM and 1 capsule at night (Patient taking differently: Take 25-50 mg by mouth 2 (two) times daily. Take 2 capsules in AM and 1 capsule at night) 270 capsule 3  . ferrous sulfate 325 (65 FE) MG tablet Take 325 mg by mouth daily with breakfast.    . fluticasone (FLONASE) 50 MCG/ACT nasal spray Place 2 sprays into both nostrils daily. 16 g 2  . furosemide (LASIX) 40 MG tablet Take 1 tablet (40 mg total) by mouth daily. 90 tablet 3  . gabapentin (NEURONTIN) 300 MG capsule Take 2 capsules (600 mg total) by mouth at bedtime. 60 capsule 11  . glimepiride (AMARYL) 2 MG tablet Take 2 mg by mouth 2 (two) times daily.     [name] guaiFENesin (MUCINEX) 600  MG 12 hr tablet Take 2 tablets (1,200 mg total) by mouth 2 (two) times daily. 360 tablet 3  . hydrochlorothiazide (HYDRODIURIL) 50 MG tablet Take 1 tablet (50 mg total) by mouth daily. 30 tablet 5  . isosorbide mononitrate (IMDUR) 30 MG 24 hr tablet Take 1 tablet (30 mg total) by mouth 2 (two) times daily. 180 tablet 2  . levETIRAcetam (KEPPRA) 750 MG tablet TAKE 1 TABLET (750 MG TOTAL) BY MOUTH 2 (TWO) TIMES DAILY. 60 tablet 11  . losartan (COZAAR) 100 MG tablet Take 1 tablet (100 mg total) by mouth daily. 90 tablet 3  . metFORMIN (GLUCOPHAGE-XR) 500 MG 24 hr tablet  Take 1,000 mg by mouth 2 (two) times daily.  6  . nicotine (NICODERM CQ - DOSED IN MG/24 HOURS) 21 mg/24hr patch Place 21 mg onto the skin daily.    . nitroGLYCERIN (NITROSTAT) 0.4 MG SL tablet Place 1 tablet (0.4 mg total) under the tongue every 5 (five) minutes as needed. 25 tablet 11  . phenytoin (DILANTIN) 100 MG ER capsule Take 1 capsule (100 mg total) by mouth 2 (two) times daily. 60 capsule 11  . potassium chloride (K-DUR) 10 MEQ tablet Take 1 tablet (10 mEq total) by mouth 2 (two) times daily. 180 tablet 1  . ranolazine (RANEXA) 500 MG 12 hr tablet Take 1 tablet (500 mg total) by mouth 2 (two) times daily. 60 tablet 0  . rosuvastatin (CRESTOR) 10 MG tablet Take 1 tablet (10 mg total) by mouth daily. 90 tablet 3  . vitamin B-12 (CYANOCOBALAMIN) 1000 MCG tablet Take 1,000 mcg by mouth daily.    . VOLTAREN 1 % GEL Apply 2 g topically as needed (pain).      No current facility-administered medications on file prior to visit.    Allergies  Allergen Reactions  . Ace Inhibitors     REACTION: cough  . Bee Venom Itching and Swelling  . Tetracycline     REACTION: reaction not known    Past Medical History  Diagnosis Date  . Coronary artery disease     post CABG in 3/07   . Hypertension   . Hyperlipidemia   . Dyslipidemia   . Diabetes mellitus     type 2  . Chronic bronchitis ([place])   . CVA (cerebral infarction) 1993  . Tobacco abuse   . COPD (chronic obstructive pulmonary disease) ([place])   . Stroke ([place]) 1993  . Carotid stenosis   . Systolic murmur     known mild AS and MR  . PONV (postoperative nausea and vomiting)     only once was patient sick in the 1980s   . Myocardial infarction ([place])   . CHF (congestive heart failure) ([place])   . Sleep apnea     used to wear a cpap- not used in 3 years   . Shortness of breath dyspnea     with exertion or when fluid builds up   . Pneumonia     hx of   . Goiter   . GERD (gastroesophageal reflux disease)   . Seizures ([place])     last  seizure- 03/2013   . Headache     hx of   . Fibromyalgia   . Anemia     Past Surgical History  Procedure Laterality Date  . Coronary stent placement  08/11/06    PCI of her ciurcumflex/OM vessel  . Cardiac catheterization  11/29/05    EF of 55%  . Cardiac catheterization  08/06/06  EF of 45-50%  . Coronary artery bypass graft  12/04/2005    x5 -- left internal mammary artery to the LAD, left radial artery to the ramus intermedius, saphenous vein graft to the obtuse marginal 1, sequential saphenous vein grat to the acute marginal and posterior descending, endoscopic vein harvesting from the left thigh with open vein harvest from right leg  . Cholecystectomy    . Appendectomy    . Cervical fusion  1990  . Cardiac catheterization N/A 05/06/2015    Procedure: Right/Left Heart Cath and Coronary/Graft Angiography;  Surgeon: Velvie Thomaston M Martinique, MD;  Location: [facility];  Service: Cardiovascular;  Laterality: N/A;  . Laparotomy Bilateral 05/19/2015    Procedure: EXPLORATORY LAPAROTOMY WITH BILATERAL SALPINGO OOPHORECTOMY /OMENTECTOMY/SEGMENTAL SIGMOID COLECTOMY ;  Surgeon: Everitt Amber, MD;  Location: [facility];  Service: Gynecology;  Laterality: Bilateral;  . Peripheral vascular catheterization N/A 11/15/2015    Procedure: Carotid PTA/Stent Intervention;  Surgeon: Lorretta Harp, MD;  Location: [facility];  Service: Cardiovascular;  Laterality: N/A;  . Peripheral vascular catheterization  11/15/2015    Procedure: Carotid Angiography;  Surgeon: Lorretta Harp, MD;  Location: [facility];  Service: Cardiovascular;;    History  Smoking status  . Current Every Day Smoker -- 1.50 packs/day for 49 years  . Types: Cigarettes  Smokeless tobacco  . Never Used    Comment: 1.5 pack to 2 packs per day    History  Alcohol Use No    Family History  Problem Relation Age of Onset  . Coronary artery disease    . Heart disease    . Heart disease Mother   . Diabetes Mother   . COPD  Mother   . Hyperlipidemia Mother   . Hypertension Mother   . Cancer Father   . Drug abuse Paternal Grandmother   . Stroke Paternal Grandfather   . Cancer Paternal Aunt     lung with mets to brain  . Cancer Paternal Uncle     skin cancer  . Cancer Paternal Uncle     lung/liver to brain  . Cancer Paternal Uncle     cancer of unknown type    Review of Systems: As noted in history of present illness.  All other systems were reviewed and are negative.  Physical Exam: BP 102/62 mmHg  Pulse 82  Ht '4\' 10"'$  (1.473 m)  Wt 60.238 kg (132 lb 12.8 oz)  BMI 27.76 kg/m2  LMP 09/02/2003 She is a chronically ill-appearing white female in no acute distress. She is normocephalic, atraumatic. Pupils are equal round and reactive. Sclera clear. Oropharynx is clear. Neck reveals bilateral carotid bruits. There is no JVD. Lungs are clear. Cardiac exam reveals a grade 1-8/2 systolic murmur at the right upper sternal border. Abdomen soft and NT. No masses. She has no edema. Pedal pulses are good. Neuro alert and oriented x 3. No focal findings.  LABORATORY DATA: Lab Results  Component Value Date   WBC 5.6 11/10/2015   HGB 10.7* 11/10/2015   HCT 35.7* 11/10/2015   PLT 250 11/10/2015   GLUCOSE 234* 11/10/2015   CHOL 130 06/21/2015   TRIG 172.0* 06/21/2015   HDL 52.50 06/21/2015   LDLDIRECT 98.0 06/09/2013   LDLCALC 43 06/21/2015   ALT 12 06/21/2015   AST 15 06/21/2015   NA 135 11/10/2015   K 3.7 11/10/2015   CL 94* 11/10/2015   CREATININE 0.74 11/10/2015   BUN 13 11/10/2015   CO2 29  11/10/2015   TSH 1.81 06/09/2014   INR 1.05 11/10/2015   HGBA1C 8.0* 06/09/2014   MICROALBUR 1.9 06/09/2013   Assessment / Plan: 1. CAD S/p CABG. Failed bypass grafts except for LIMA to LAD. S/p remote stenting of LCX and OM with Taxus DES now occluded. Stable class 2 angina. Continue aggressive antianginal Rx. With ASA, plavix, nitrates, coreg, and Ranexa. Stressed importance of smoking cessation. There is some  concern about interaction with Ranexa and dilantin. Will ask Pharm D to review. 2. Carotid arterial disese- moderate, 70% bilateral. Not favorable for stenting. She is a poor surgical candidate. Will follow up with Dr. Gwenlyn Found. 3. Tobacco abuse. Recommend complete cessation. Patient states she is going to keep trying to quit. 4. HTN controlled. 5. Chronic combined systolic and diastolic CHF. Severe LV dysfunction. By cath EDP was normal.  Continue current medication. Sodium restriction. Will refer to Pharm D to see about starting Entresto.  6. Pulmonary HTN. 7. S/p major abdominal surgery for cystadenoma. No complications. 8. Lacunar infarcts. Explained that this is more likely related to chronic HTN and not her carotid disease.

## 2016-01-31 ENCOUNTER — Other Ambulatory Visit (INDEPENDENT_AMBULATORY_CARE_PROVIDER_SITE_OTHER): Payer: Medicare Other

## 2016-01-31 DIAGNOSIS — D519 Vitamin B12 deficiency anemia, unspecified: Secondary | ICD-10-CM

## 2016-01-31 DIAGNOSIS — D649 Anemia, unspecified: Secondary | ICD-10-CM

## 2016-01-31 LAB — CBC WITH DIFFERENTIAL/PLATELET
Basophils Absolute: 0 10*3/uL (ref 0.0–0.1)
Basophils Relative: 0.3 % (ref 0.0–3.0)
Eosinophils Absolute: 0.1 10*3/uL (ref 0.0–0.7)
Eosinophils Relative: 1 % (ref 0.0–5.0)
HCT: 46.7 % — ABNORMAL HIGH (ref 36.0–46.0)
Hemoglobin: 15.6 g/dL — ABNORMAL HIGH (ref 12.0–15.0)
Lymphocytes Relative: 24.5 % (ref 12.0–46.0)
Lymphs Abs: 2.1 10*3/uL (ref 0.7–4.0)
MCHC: 33.5 g/dL (ref 30.0–36.0)
MCV: 82.6 fl (ref 78.0–100.0)
Monocytes Absolute: 0.8 10*3/uL (ref 0.1–1.0)
Monocytes Relative: 9 % (ref 3.0–12.0)
Neutro Abs: 5.7 10*3/uL (ref 1.4–7.7)
Neutrophils Relative %: 65.2 % (ref 43.0–77.0)
Platelets: 201 10*3/uL (ref 150.0–400.0)
RBC: 5.65 Mil/uL — ABNORMAL HIGH (ref 3.87–5.11)
RDW: 27.6 % — ABNORMAL HIGH (ref 11.5–15.5)
WBC: 8.7 10*3/uL (ref 4.0–10.5)

## 2016-01-31 LAB — FERRITIN: Ferritin: 15.1 ng/mL (ref 10.0–291.0)

## 2016-01-31 LAB — VITAMIN B12: Vitamin B-12: 491 pg/mL (ref 211–911)

## 2016-02-08 DIAGNOSIS — R5383 Other fatigue: Secondary | ICD-10-CM | POA: Diagnosis not present

## 2016-02-08 DIAGNOSIS — M797 Fibromyalgia: Secondary | ICD-10-CM | POA: Diagnosis not present

## 2016-02-08 DIAGNOSIS — G4709 Other insomnia: Secondary | ICD-10-CM | POA: Diagnosis not present

## 2016-02-10 ENCOUNTER — Telehealth: Payer: Self-pay | Admitting: Family Medicine

## 2016-02-10 NOTE — Telephone Encounter (Signed)
I notified patient that [facility] didn't try to call her.

## 2016-02-10 NOTE — Telephone Encounter (Signed)
Patient called and said she received a missed call.  She thinks it could be about her lab work.

## 2016-02-10 NOTE — Telephone Encounter (Signed)
I didn't call pt and I'm not in the office today, sorry

## 2016-02-28 DIAGNOSIS — E041 Nontoxic single thyroid nodule: Secondary | ICD-10-CM | POA: Diagnosis not present

## 2016-02-28 DIAGNOSIS — I10 Essential (primary) hypertension: Secondary | ICD-10-CM | POA: Diagnosis not present

## 2016-02-28 DIAGNOSIS — E7800 Pure hypercholesterolemia, unspecified: Secondary | ICD-10-CM | POA: Diagnosis not present

## 2016-02-28 DIAGNOSIS — E119 Type 2 diabetes mellitus without complications: Secondary | ICD-10-CM | POA: Diagnosis not present

## 2016-03-13 ENCOUNTER — Other Ambulatory Visit: Payer: Self-pay | Admitting: *Deleted

## 2016-03-13 ENCOUNTER — Other Ambulatory Visit: Payer: Self-pay

## 2016-03-13 MED ORDER — POTASSIUM CHLORIDE ER 10 MEQ PO TBCR: 10.0000 meq | EXTENDED_RELEASE_TABLET | Freq: Two times a day (BID) | ORAL | Status: DC

## 2016-03-13 MED ORDER — RANOLAZINE ER 500 MG PO TB12: 500.0000 mg | ORAL_TABLET | Freq: Two times a day (BID) | ORAL | Status: DC

## 2016-03-13 NOTE — Telephone Encounter (Signed)
Rx request sent to pharmacy.  

## 2016-03-21 ENCOUNTER — Ambulatory Visit: Payer: Self-pay | Admitting: Neurology

## 2016-04-09 ENCOUNTER — Other Ambulatory Visit: Payer: Self-pay

## 2016-04-09 MED ORDER — ISOSORBIDE MONONITRATE ER 30 MG PO TB24: 30.0000 mg | ORAL_TABLET | Freq: Two times a day (BID) | ORAL | Status: DC

## 2016-04-09 NOTE — Telephone Encounter (Signed)
Rx(s) sent to pharmacy electronically.  

## 2016-04-25 ENCOUNTER — Other Ambulatory Visit: Payer: Self-pay | Admitting: Cardiology

## 2016-05-01 ENCOUNTER — Encounter: Payer: Self-pay | Admitting: Cardiology

## 2016-05-01 ENCOUNTER — Ambulatory Visit (INDEPENDENT_AMBULATORY_CARE_PROVIDER_SITE_OTHER): Payer: Medicare Other | Admitting: Cardiology

## 2016-05-01 VITALS — BP 110/68 | HR 77 | Ht <= 58 in | Wt 139.4 lb

## 2016-05-01 DIAGNOSIS — I10 Essential (primary) hypertension: Secondary | ICD-10-CM | POA: Diagnosis not present

## 2016-05-01 DIAGNOSIS — F17200 Nicotine dependence, unspecified, uncomplicated: Secondary | ICD-10-CM | POA: Diagnosis not present

## 2016-05-01 DIAGNOSIS — I6350 Cerebral infarction due to unspecified occlusion or stenosis of unspecified cerebral artery: Secondary | ICD-10-CM

## 2016-05-01 DIAGNOSIS — I25708 Atherosclerosis of coronary artery bypass graft(s), unspecified, with other forms of angina pectoris: Secondary | ICD-10-CM | POA: Diagnosis not present

## 2016-05-01 NOTE — Progress Notes (Signed)
Analyce Tavares [name] Date of Birth: January 22, 1956 Medical Record #[id number]  History of Present Illness: Diane [name] is seen for follow up CAD. She has a history of coronary disease and is status post CABG in March of 2007 by Dr. [name]. She presented later that year with recurrent angina. Repeat cardiac cath showed patent LIMA to the LAD but all other grafts occluded including SVG to OM, SVG to AC/PL, and radial graft to ramus intermediate. She then had complex stenting of the mid LCx and first OM with Taxus stents. The native RCA was occluded with collaterals.  Last year she was evaluated for abdominal surgery. Myoview abnormal. Cardiac cath as noted above but now stents in LCx and OM occluded. No suitable targets for PCI or surgery. Severe LV dysfunction with moderate pulmonary HTN and normal LV filling pressures. She has multiple cardiac risk factors including diabetes, dyslipidemia, hypertension, and tobacco abuse. She also has  bilateral carotid arterial disease. She continues to smoke.   In August 2016 she had removal of a large pelvic mass with BSO and sigmoid colectomy. Path c/w ovarian cystadenoma. No complications.  She reports she still has some chest pain but less frequent and mild. Relieved with sl NTG x 1. Appetite is good. Denies SOB. Feels fatigued. Notes some swelling in feet and hands at night. She was seen in February by Dr. Gwenlyn Found for her carotid disease. Angiogram performed with findings of 70% bilateral stenoses. The LICA was heavily calcified. Both arteries had acute angulation after the stenosis making it less favorable for stenting. In March she developed dizziness, lightheadedness, and imbalance. Dr. Glori Bickers ordered an MRI that showed an old right MCA infarct and chronic lacunar infarcts that were new from 2014.  She does note some nausea and trouble emptying her bladder that is similar to when she had her cystadenoma removed.   Current Outpatient Prescriptions on File Prior to Visit   Medication Sig Dispense Refill  . albuterol (PROVENTIL HFA;VENTOLIN HFA) 108 (90 BASE) MCG/ACT inhaler Inhale 2 puffs into the lungs every 6 (six) hours as needed for wheezing or shortness of breath. 1 Inhaler 5  . albuterol (PROVENTIL) (2.5 MG/3ML) 0.083% nebulizer solution USE 1 VIAL PER NEBULIZRE EVERY 6 HOURS AS NEEDED *HOSPITALIST DOESNT DO PA 75 mL 0  . aspirin EC 81 MG tablet Take 1 tablet (81 mg total) by mouth daily. 30 tablet 6  . carvedilol (COREG) 25 MG tablet Take 1 tablet (25 mg total) by mouth 2 (two) times daily with a meal. 180 tablet 3  . clopidogrel (PLAVIX) 75 MG tablet TAKE 1 TABLET BY MOUTH EVERY DAY 90 tablet 0  . cyclobenzaprine (FLEXERIL) 10 MG tablet Take 10 mg by mouth at bedtime.     [name] DEXILANT 60 MG capsule TAKE 1 CAPSULE (60 MG TOTAL) BY MOUTH DAILY. 30 capsule 9  . diphenhydrAMINE (BENADRYL) 25 mg capsule Take 2 capsules in AM and 1 capsule at night (Patient taking differently: Take 25-50 mg by mouth 2 (two) times daily. Take 2 capsules in AM and 1 capsule at night) 270 capsule 3  . ferrous sulfate 325 (65 FE) MG tablet Take 325 mg by mouth daily with breakfast.    . fluticasone (FLONASE) 50 MCG/ACT nasal spray Place 2 sprays into both nostrils daily. 16 g 2  . furosemide (LASIX) 40 MG tablet Take 1 tablet (40 mg total) by mouth daily. 90 tablet 3  . gabapentin (NEURONTIN) 300 MG capsule Take 2 capsules (600 mg total) by mouth at  bedtime. 60 capsule 11  . glimepiride (AMARYL) 2 MG tablet Take 2 mg by mouth 2 (two) times daily.     [name] guaiFENesin (MUCINEX) 600 MG 12 hr tablet Take 2 tablets (1,200 mg total) by mouth 2 (two) times daily. 360 tablet 3  . hydrochlorothiazide (HYDRODIURIL) 50 MG tablet Take 1 tablet (50 mg total) by mouth daily. 30 tablet 5  . isosorbide mononitrate (IMDUR) 30 MG 24 hr tablet Take 1 tablet (30 mg total) by mouth 2 (two) times daily. 180 tablet 2  . levETIRAcetam (KEPPRA) 750 MG tablet TAKE 1 TABLET (750 MG TOTAL) BY MOUTH 2 (TWO) TIMES  DAILY. 60 tablet 11  . losartan (COZAAR) 100 MG tablet Take 1 tablet (100 mg total) by mouth daily. 90 tablet 3  . metFORMIN (GLUCOPHAGE-XR) 500 MG 24 hr tablet Take 1,000 mg by mouth 2 (two) times daily.  6  . nicotine (NICODERM CQ - DOSED IN MG/24 HOURS) 21 mg/24hr patch Place 21 mg onto the skin daily.    . nitroGLYCERIN (NITROSTAT) 0.4 MG SL tablet Place 1 tablet (0.4 mg total) under the tongue every 5 (five) minutes as needed. 25 tablet 11  . phenytoin (DILANTIN) 100 MG ER capsule Take 1 capsule (100 mg total) by mouth 2 (two) times daily. 60 capsule 11  . potassium chloride (K-DUR) 10 MEQ tablet Take 1 tablet (10 mEq total) by mouth 2 (two) times daily. 180 tablet 0  . ranolazine (RANEXA) 500 MG 12 hr tablet Take 1 tablet (500 mg total) by mouth 2 (two) times daily. 60 tablet 1  . rosuvastatin (CRESTOR) 10 MG tablet Take 1 tablet (10 mg total) by mouth daily. 90 tablet 3  . vitamin B-12 (CYANOCOBALAMIN) 1000 MCG tablet Take 1,000 mcg by mouth daily.    . VOLTAREN 1 % GEL Apply 2 g topically as needed (pain).      No current facility-administered medications on file prior to visit.     Allergies  Allergen Reactions  . Ace Inhibitors     REACTION: cough  . Bee Venom Itching and Swelling  . Tetracycline     REACTION: reaction not known    Past Medical History:  Diagnosis Date  . Anemia   . Carotid stenosis   . CHF (congestive heart failure) ([place])   . Chronic bronchitis ([place])   . COPD (chronic obstructive pulmonary disease) ([place])   . Coronary artery disease    post CABG in 3/07   . CVA (cerebral infarction) 1993  . Diabetes mellitus    type 2  . Dyslipidemia   . Fibromyalgia   . GERD (gastroesophageal reflux disease)   . Goiter   . Headache    hx of   . Hyperlipidemia   . Hypertension   . Myocardial infarction ([place])   . Pneumonia    hx of   . PONV (postoperative nausea and vomiting)    only once was patient sick in the 1980s   . Seizures ([place])    last seizure-  03/2013   . Shortness of breath dyspnea    with exertion or when fluid builds up   . Sleep apnea    used to wear a cpap- not used in 3 years   . Stroke ([place]) 1993  . Systolic murmur    known mild AS and MR  . Tobacco abuse     Past Surgical History:  Procedure Laterality Date  . APPENDECTOMY    . CARDIAC CATHETERIZATION  11/29/05  EF of 55%  . CARDIAC CATHETERIZATION  08/06/06   EF of 45-50%  . CARDIAC CATHETERIZATION N/A 05/06/2015   Procedure: Right/Left Heart Cath and Coronary/Graft Angiography;  Surgeon: Peter M Martinique, MD;  Location: [facility];  Service: Cardiovascular;  Laterality: N/A;  . CERVICAL FUSION  1990  . CHOLECYSTECTOMY    . CORONARY ARTERY BYPASS GRAFT  12/04/2005   x5 -- left internal mammary artery to the LAD, left radial artery to the ramus intermedius, saphenous vein graft to the obtuse marginal 1, sequential saphenous vein grat to the acute marginal and posterior descending, endoscopic vein harvesting from the left thigh with open vein harvest from right leg  . CORONARY STENT PLACEMENT  08/11/06   PCI of her ciurcumflex/OM vessel  . LAPAROTOMY Bilateral 05/19/2015   Procedure: EXPLORATORY LAPAROTOMY WITH BILATERAL SALPINGO OOPHORECTOMY /OMENTECTOMY/SEGMENTAL SIGMOID COLECTOMY ;  Surgeon: Everitt Amber, MD;  Location: [facility];  Service: Gynecology;  Laterality: Bilateral;  . PERIPHERAL VASCULAR CATHETERIZATION N/A 11/15/2015   Procedure: Carotid PTA/Stent Intervention;  Surgeon: Lorretta Harp, MD;  Location: [facility];  Service: Cardiovascular;  Laterality: N/A;  . PERIPHERAL VASCULAR CATHETERIZATION  11/15/2015   Procedure: Carotid Angiography;  Surgeon: Lorretta Harp, MD;  Location: [facility];  Service: Cardiovascular;;    History  Smoking Status  . Current Every Day Smoker  . Packs/day: 1.50  . Years: 49.00  . Types: Cigarettes  Smokeless Tobacco  . Never Used    Comment: 1.5 pack to 2 packs per day    History  Alcohol Use No     Family History  Problem Relation Age of Onset  . Coronary artery disease    . Heart disease    . Heart disease Mother   . Diabetes Mother   . COPD Mother   . Hyperlipidemia Mother   . Hypertension Mother   . Cancer Father   . Drug abuse Paternal Grandmother   . Stroke Paternal Grandfather   . Cancer Paternal Aunt     lung with mets to brain  . Cancer Paternal Uncle     skin cancer  . Cancer Paternal Uncle     lung/liver to brain  . Cancer Paternal Uncle     cancer of unknown type    Review of Systems: As noted in history of present illness.  All other systems were reviewed and are negative.  Physical Exam: BP 110/68   Pulse 77   Ht '4\' 10"'$  (1.473 m)   Wt 139 lb 6.4 oz (63.2 kg)   LMP 09/02/2003   BMI 29.13 kg/m  She is a chronically ill-appearing white female in no acute distress. She is normocephalic, atraumatic. Pupils are equal round and reactive. Sclera clear. Oropharynx is clear. Neck reveals bilateral carotid bruits. There is no JVD. Lungs are clear. Cardiac exam reveals a grade 7-5/6 systolic murmur at the right upper sternal border. Abdomen soft and NT. No masses. She has no edema. Pedal pulses are good. Neuro alert and oriented x 3. No focal findings.  LABORATORY DATA: Lab Results  Component Value Date   WBC 8.7 01/31/2016   HGB 15.6 (H) 01/31/2016   HCT 46.7 (H) 01/31/2016   PLT 201.0 01/31/2016   GLUCOSE 234 (H) 11/10/2015   CHOL 130 06/21/2015   TRIG 172.0 (H) 06/21/2015   HDL 52.50 06/21/2015   LDLDIRECT 98.0 06/09/2013   LDLCALC 43 06/21/2015   ALT 12 06/21/2015   AST 15 06/21/2015   NA  135 11/10/2015   K 3.7 11/10/2015   CL 94 (L) 11/10/2015   CREATININE 0.74 11/10/2015   BUN 13 11/10/2015   CO2 29 11/10/2015   TSH 1.81 06/09/2014   INR 1.05 11/10/2015   HGBA1C 8.0 (H) 06/09/2014   MICROALBUR 1.9 06/09/2013   Assessment / Plan: 1. CAD S/p CABG. Failed bypass grafts except for LIMA to LAD. S/p remote stenting of LCX and OM with Taxus  DES now occluded. Stable class 2 angina. Continue aggressive antianginal Rx. With ASA, plavix, nitrates, coreg, and Ranexa. Stressed importance of smoking cessation. There is some concern about interaction with Ranexa and dilantin. Will ask Pharm D to review. 2. Carotid arterial disese- moderate, 70% bilateral. Not favorable for stenting. She is a poor surgical candidate. Will arrange follow up with Dr. Gwenlyn Found. 3. Tobacco abuse. Recommend complete cessation. Patient states she is going to keep trying to quit. 4. HTN controlled. 5. Chronic combined systolic and diastolic CHF. Severe LV dysfunction. By cath EDP was normal.  Continue current medication. Sodium restriction. Will refer to Pharm D- she may be a good candidate for Entresto if affordable.  6. Pulmonary HTN. 7. S/p major abdominal surgery for cystadenoma. No complications. She is going to follow up with GYN concerning her symptoms.  8. Lacunar infarcts. Explained that this is more likely related to chronic HTN and not her carotid disease.  I will follow up in 4 months.

## 2016-05-01 NOTE — Patient Instructions (Addendum)
Continue your current therapy  We will arrange follow up with pharmacy and Dr. Gwenlyn Found  I will see you in 4 months.

## 2016-05-08 ENCOUNTER — Encounter: Payer: Self-pay | Admitting: Pharmacist

## 2016-05-08 ENCOUNTER — Ambulatory Visit (INDEPENDENT_AMBULATORY_CARE_PROVIDER_SITE_OTHER): Payer: Medicare Other | Admitting: Pharmacist

## 2016-05-08 VITALS — BP 122/68 | HR 74 | Ht <= 58 in | Wt 139.6 lb

## 2016-05-08 DIAGNOSIS — I509 Heart failure, unspecified: Secondary | ICD-10-CM | POA: Diagnosis not present

## 2016-05-08 DIAGNOSIS — I6350 Cerebral infarction due to unspecified occlusion or stenosis of unspecified cerebral artery: Secondary | ICD-10-CM | POA: Diagnosis not present

## 2016-05-08 DIAGNOSIS — I5022 Chronic systolic (congestive) heart failure: Secondary | ICD-10-CM | POA: Insufficient documentation

## 2016-05-08 DIAGNOSIS — I10 Essential (primary) hypertension: Secondary | ICD-10-CM

## 2016-05-08 MED ORDER — SACUBITRIL-VALSARTAN 49-51 MG PO TABS: 1.0000 | ORAL_TABLET | Freq: Two times a day (BID) | ORAL | 1 refills | Status: DC

## 2016-05-08 NOTE — Patient Instructions (Addendum)
Return for a a follow up appointment in 2 weeks  Check your blood pressure at home daily (if able) and keep record of the readings.  Take your BP meds as follows: Stop losartan  Start taking Entresto twice daily   Bring all of your meds, your BP cuff and your record of home blood pressures to your next appointment.  Exercise as you're able, try to walk approximately 30 minutes per day.  Keep salt intake to a minimum, especially watch canned and prepared boxed foods.  Eat more fresh fruits and vegetables and fewer canned items.  Avoid eating in fast food restaurants.    HOW TO TAKE YOUR BLOOD PRESSURE: . Rest 5 minutes before taking your blood pressure. .  Don't smoke or drink caffeinated beverages for at least 30 minutes before. . Take your blood pressure before (not after) you eat. . Sit comfortably with your back supported and both feet on the floor (don't cross your legs). . Elevate your arm to heart level on a table or a desk. . Use the proper sized cuff. It should fit smoothly and snugly around your bare upper arm. There should be enough room to slip a fingertip under the cuff. The bottom edge of the cuff should be 1 inch above the crease of the elbow. . Ideally, take 3 measurements at one sitting and record the average.   Call [phone] if medication is too expensive for you.

## 2016-05-08 NOTE — Progress Notes (Signed)
Patient ID: Diane [name]                 DOB: 1955-10-15                      MRN: [id number]     HPI: Diane [name] is a 60 y.o. female patient of Dr. Martinique with [place] below who presents today for hypertension evaluation. She saw Dr. Martinique a week ago.   Today she reports doing well since that visit but does have several questions related to her medications.  The first of which is the drug interaction between Ranexa and Dilantin. According to the drug reference this is a category X. The reaction is that the level of Ranexa is lowered due to the CYP3A4 induction of dilantin. She reports that she is not entirely unsure if this medication has been helping her because she has been on both for several years. She believes it has though. She is wondering if we could increase the dose to help with this. We discussed that this could potentially be a waste of her money if the levels are not high enough to be "therapeutic." We also discussed trial off medication since it is essentially a quality of life drug.   She also asks about the spray nitroglycerin. She has recently used her tablets on Sunday and Monday (she reports no chest pain today). She reports that she previously has had an episode of chest pain so severe she was unable to open the little bottle of pills and once she finally did open it she spilled them on the floor. This concerned her because she had to scrounge around on the floor before she could find them. We will send for PA today for coverage of spray to help.   Lastly she would like to be changed to [facility] as she has heard this medication could prevent hospital admissions.   Cardiac Hx: CHF (EF 35%), HTN, atherosclerosis, CAD, OSA, DM, HLD, tobacco abuse, hx of CVA  Current HTN meds:  Carvedilol '25mg'$  BID Furosemide '40mg'$  once daily HCTZ '50mg'$  once daily Isosorbide mononitrate '30mg'$  BID Losartan '100mg'$  daily  BP goal: <140/90  Family History: She reports that her mother had a few  heart attacks. She does not recall how old she was when she had her first event, but states everyone on that side has heart problems. Her father has no cardiac related issues that she is aware of and her sister only has hypertension.   Social History: She is a current smoker (about 1.5 ppd). She is working with the [employer]. She reports that she has been thinking about quitting cold [place], but has not yet set a quit date. In the past she has tried quitting with patches, gum, Chantix, and Zyban. Her most recent quit attempt was July 15th and her longest quit attempt was a few years ago was a couple of months.   She has not had any alcohol for 2 years and she has been seizure free for 2 years as well.   Diet: She eats most of her meals at home. She cooks with very little salt and rarely adds salt at the table. She drinks only decaf coffee. She denies tea and soda. She drinks only ginger ale for her recent nausea.   Exercise: very little exercise. She reports trying to get to the pool once or twice a week. When she does go she goes for about 45 minutes.   Home  BP readings: She does have an automatic arm cuff but has not been monitoring at home.   Wt Readings from Last 3 Encounters:  05/08/16 139 lb 9.6 oz (63.3 kg)  05/01/16 139 lb 6.4 oz (63.2 kg)  01/26/16 132 lb 12.8 oz (60.2 kg)   BP Readings from Last 3 Encounters:  05/08/16 122/68  05/01/16 110/68  01/26/16 102/62   Pulse Readings from Last 3 Encounters:  05/08/16 74  05/01/16 77  01/26/16 82    Renal function: CrCl cannot be calculated (Patient's most recent lab result is older than the maximum 21 days allowed.).  Past Medical History:  Diagnosis Date  . Anemia   . Carotid stenosis   . CHF (congestive heart failure) ([place])   . Chronic bronchitis ([place])   . COPD (chronic obstructive pulmonary disease) ([place])   . Coronary artery disease    post CABG in 3/07   . CVA (cerebral infarction) 1993  . Diabetes mellitus    type  2  . Dyslipidemia   . Fibromyalgia   . GERD (gastroesophageal reflux disease)   . Goiter   . Headache    hx of   . Hyperlipidemia   . Hypertension   . Myocardial infarction ([place])   . Pneumonia    hx of   . PONV (postoperative nausea and vomiting)    only once was patient sick in the 1980s   . Seizures ([place])    last seizure- 03/2013   . Shortness of breath dyspnea    with exertion or when fluid builds up   . Sleep apnea    used to wear a cpap- not used in 3 years   . Stroke ([place]) 1993  . Systolic murmur    known mild AS and MR  . Tobacco abuse     Current Outpatient Prescriptions on File Prior to Visit  Medication Sig Dispense Refill  . albuterol (PROVENTIL HFA;VENTOLIN HFA) 108 (90 BASE) MCG/ACT inhaler Inhale 2 puffs into the lungs every 6 (six) hours as needed for wheezing or shortness of breath. 1 Inhaler 5  . albuterol (PROVENTIL) (2.5 MG/3ML) 0.083% nebulizer solution USE 1 VIAL PER NEBULIZRE EVERY 6 HOURS AS NEEDED *HOSPITALIST DOESNT DO PA 75 mL 0  . aspirin EC 81 MG tablet Take 1 tablet (81 mg total) by mouth daily. 30 tablet 6  . carvedilol (COREG) 25 MG tablet Take 1 tablet (25 mg total) by mouth 2 (two) times daily with a meal. 180 tablet 3  . clopidogrel (PLAVIX) 75 MG tablet TAKE 1 TABLET BY MOUTH EVERY DAY 90 tablet 0  . cyclobenzaprine (FLEXERIL) 10 MG tablet Take 10 mg by mouth at bedtime as needed.     [name] DEXILANT 60 MG capsule TAKE 1 CAPSULE (60 MG TOTAL) BY MOUTH DAILY. 30 capsule 9  . fluticasone (FLONASE) 50 MCG/ACT nasal spray Place 2 sprays into both nostrils daily. 16 g 2  . furosemide (LASIX) 40 MG tablet Take 1 tablet (40 mg total) by mouth daily. 90 tablet 3  . gabapentin (NEURONTIN) 300 MG capsule Take 2 capsules (600 mg total) by mouth at bedtime. 60 capsule 11  . glimepiride (AMARYL) 2 MG tablet Take 2 mg by mouth 2 (two) times daily.     [name] guaiFENesin (MUCINEX) 600 MG 12 hr tablet Take 2 tablets (1,200 mg total) by mouth 2 (two) times daily. 360  tablet 3  . hydrochlorothiazide (HYDRODIURIL) 50 MG tablet Take 1 tablet (50 mg total) by mouth daily. [place]  tablet 5  . isosorbide mononitrate (IMDUR) 30 MG 24 hr tablet Take 1 tablet (30 mg total) by mouth 2 (two) times daily. 180 tablet 2  . levETIRAcetam (KEPPRA) 750 MG tablet TAKE 1 TABLET (750 MG TOTAL) BY MOUTH 2 (TWO) TIMES DAILY. 60 tablet 11  . metFORMIN (GLUCOPHAGE-XR) 500 MG 24 hr tablet Take 1,000 mg by mouth 2 (two) times daily.  6  . nitroGLYCERIN (NITROSTAT) 0.4 MG SL tablet Place 1 tablet (0.4 mg total) under the tongue every 5 (five) minutes as needed. 25 tablet 11  . phenytoin (DILANTIN) 100 MG ER capsule Take 1 capsule (100 mg total) by mouth 2 (two) times daily. 60 capsule 11  . potassium chloride (K-DUR) 10 MEQ tablet Take 1 tablet (10 mEq total) by mouth 2 (two) times daily. 180 tablet 0  . ranolazine (RANEXA) 500 MG 12 hr tablet Take 1 tablet (500 mg total) by mouth 2 (two) times daily. 60 tablet 1  . rosuvastatin (CRESTOR) 10 MG tablet Take 1 tablet (10 mg total) by mouth daily. 90 tablet 3  . vitamin B-12 (CYANOCOBALAMIN) 1000 MCG tablet Take 1,000 mcg by mouth daily.    . VOLTAREN 1 % GEL Apply 2 g topically as needed (pain).     [name] diphenhydrAMINE (BENADRYL) 25 mg capsule Take 2 capsules in AM and 1 capsule at night (Patient not taking: Reported on 05/08/2016) 270 capsule 3  . ferrous sulfate 325 (65 FE) MG tablet Take 325 mg by mouth daily with breakfast.    . nicotine (NICODERM CQ - DOSED IN MG/24 HOURS) 21 mg/24hr patch Place 21 mg onto the skin daily.     No current facility-administered medications on file prior to visit.     Allergies  Allergen Reactions  . Ace Inhibitors     REACTION: cough  . Bee Venom Itching and Swelling  . Tetracycline     REACTION: reaction not known    Blood pressure 122/68, pulse 74, height '4\' 10"'$  (1.473 m), weight 139 lb 9.6 oz (63.3 kg), last menstrual period 09/02/2003.   Assessment/Plan: Hypertension/CHF: BP is at goal today,  however she may benefit from change to [facility] due to her history of CHF. Will discontinue losartan starting tomorrow since she has already taken dose today and start entresto 49-'51mg'$  twice daily. Samples given but she will at least call pharmacy to verify price today before making change tomorrow.   Have sent PA request for nitroglycerin spray.   Will talk to Dr. Martinique about Ranexa drug interaction and how he would like to proceed with both medications.    Thank you, Lelan Pons. Patterson Hammersmith, [facility]  05/08/2016 1:36 PM

## 2016-05-10 ENCOUNTER — Other Ambulatory Visit: Payer: Self-pay | Admitting: Cardiology

## 2016-05-10 ENCOUNTER — Telehealth: Payer: Self-pay | Admitting: Pharmacist

## 2016-05-10 MED ORDER — RANOLAZINE ER 1000 MG PO TB12: 1000.0000 mg | ORAL_TABLET | Freq: Two times a day (BID) | ORAL | 1 refills | Status: DC

## 2016-05-10 NOTE — Telephone Encounter (Signed)
Called patient regarding Ranexa dose (see note from Dr. Martinique below).  Information submitted to insurance for coverage of nasal spray nitroglycerin as well.   Her copayment for [name] will be $3 which is affordable to her. She did start this medication today.    Peter M Martinique, MD  Erskine Emery, RPH        I don't think it would hurt to increase to 1000 mg bid to see if this affords better angina control.

## 2016-05-10 NOTE — Telephone Encounter (Signed)
Rx request sent to pharmacy.  

## 2016-05-11 ENCOUNTER — Encounter: Payer: Self-pay | Admitting: Pulmonary Disease

## 2016-05-11 ENCOUNTER — Ambulatory Visit (INDEPENDENT_AMBULATORY_CARE_PROVIDER_SITE_OTHER): Payer: Medicare Other | Admitting: Pulmonary Disease

## 2016-05-11 VITALS — BP 124/80 | HR 75 | Ht <= 58 in | Wt 141.4 lb

## 2016-05-11 DIAGNOSIS — J411 Mucopurulent chronic bronchitis: Secondary | ICD-10-CM

## 2016-05-11 DIAGNOSIS — Z720 Tobacco use: Secondary | ICD-10-CM | POA: Diagnosis not present

## 2016-05-11 DIAGNOSIS — Z23 Encounter for immunization: Secondary | ICD-10-CM | POA: Diagnosis not present

## 2016-05-11 DIAGNOSIS — I6350 Cerebral infarction due to unspecified occlusion or stenosis of unspecified cerebral artery: Secondary | ICD-10-CM | POA: Diagnosis not present

## 2016-05-11 DIAGNOSIS — G4733 Obstructive sleep apnea (adult) (pediatric): Secondary | ICD-10-CM

## 2016-05-11 MED ORDER — FLUTICASONE FUROATE-VILANTEROL 100-25 MCG/INH IN AEPB: 1.0000 | INHALATION_SPRAY | Freq: Every day | RESPIRATORY_TRACT | 5 refills | Status: DC

## 2016-05-11 MED ORDER — ALBUTEROL SULFATE HFA 108 (90 BASE) MCG/ACT IN AERS: 2.0000 | INHALATION_SPRAY | Freq: Four times a day (QID) | RESPIRATORY_TRACT | 5 refills | Status: DC | PRN

## 2016-05-11 MED ORDER — FLUTICASONE FUROATE-VILANTEROL 100-25 MCG/INH IN AEPB: 1.0000 | INHALATION_SPRAY | Freq: Every day | RESPIRATORY_TRACT | 0 refills | Status: AC

## 2016-05-11 MED ORDER — ALBUTEROL SULFATE (2.5 MG/3ML) 0.083% IN NEBU: 2.5000 mg | INHALATION_SOLUTION | Freq: Four times a day (QID) | RESPIRATORY_TRACT | 5 refills | Status: DC

## 2016-05-11 NOTE — Addendum Note (Signed)
Addended by: [name] on: 05/11/2016 12:24 PM   Modules accepted: Orders

## 2016-05-11 NOTE — Patient Instructions (Signed)
Breo one puff daily >> rinse mouth after each use Ventolin two puffs every 6 hours as needed for cough, wheeze, or chest congestion Will schedule sleep study  Follow up in 4 months

## 2016-05-11 NOTE — Progress Notes (Signed)
Current Outpatient Prescriptions on File Prior to Visit  Medication Sig  . albuterol (PROVENTIL) (2.5 MG/3ML) 0.083% nebulizer solution USE 1 VIAL PER NEBULIZRE EVERY 6 HOURS AS NEEDED *HOSPITALIST DOESNT DO PA  . aspirin EC 81 MG tablet Take 1 tablet (81 mg total) by mouth daily.  . carvedilol (COREG) 25 MG tablet Take 1 tablet (25 mg total) by mouth 2 (two) times daily with a meal.  . clopidogrel (PLAVIX) 75 MG tablet TAKE 1 TABLET BY MOUTH EVERY DAY  . cyclobenzaprine (FLEXERIL) 10 MG tablet Take 10 mg by mouth at bedtime as needed.   [name] DEXILANT 60 MG capsule TAKE 1 CAPSULE (60 MG TOTAL) BY MOUTH DAILY.  . diphenhydrAMINE (BENADRYL) 25 mg capsule Take 2 capsules in AM and 1 capsule at night  . ferrous sulfate 325 (65 FE) MG tablet Take 325 mg by mouth daily with breakfast.  . fluticasone (FLONASE) 50 MCG/ACT nasal spray Place 2 sprays into both nostrils daily.  . furosemide (LASIX) 40 MG tablet Take 1 tablet (40 mg total) by mouth daily.  [name] gabapentin (NEURONTIN) 300 MG capsule Take 2 capsules (600 mg total) by mouth at bedtime.  [name] glimepiride (AMARYL) 2 MG tablet Take 2 mg by mouth 2 (two) times daily.   [name] guaiFENesin (MUCINEX) 600 MG 12 hr tablet Take 2 tablets (1,200 mg total) by mouth 2 (two) times daily.  . hydrochlorothiazide (HYDRODIURIL) 50 MG tablet Take 1 tablet (50 mg total) by mouth daily.  . isosorbide mononitrate (IMDUR) 30 MG 24 hr tablet Take 1 tablet (30 mg total) by mouth 2 (two) times daily.  [name] levETIRAcetam (KEPPRA) 750 MG tablet TAKE 1 TABLET (750 MG TOTAL) BY MOUTH 2 (TWO) TIMES DAILY.  . metFORMIN (GLUCOPHAGE-XR) 500 MG 24 hr tablet Take 1,000 mg by mouth 2 (two) times daily.  . nicotine (NICODERM CQ - DOSED IN MG/24 HOURS) 21 mg/24hr patch Place 21 mg onto the skin daily.  . nitroGLYCERIN (NITROSTAT) 0.4 MG SL tablet Place 1 tablet (0.4 mg total) under the tongue every 5 (five) minutes as needed.  . phenytoin (DILANTIN) 100 MG ER capsule Take 1 capsule (100 mg total)  by mouth 2 (two) times daily.  . potassium chloride (K-DUR) 10 MEQ tablet Take 1 tablet (10 mEq total) by mouth 2 (two) times daily.  . ranolazine (RANEXA) 1000 MG SR tablet Take 1 tablet (1,000 mg total) by mouth 2 (two) times daily.  . rosuvastatin (CRESTOR) 10 MG tablet Take 1 tablet (10 mg total) by mouth daily.  . sacubitril-valsartan (ENTRESTO) 49-51 MG Take 1 tablet by mouth 2 (two) times daily.  . vitamin B-12 (CYANOCOBALAMIN) 1000 MCG tablet Take 1,000 mcg by mouth daily.  . VOLTAREN 1 % GEL Apply 2 g topically as needed (pain).    No current facility-administered medications on file prior to visit.      Chief Complaint  Patient presents with  . Acute Visit    Last seen 2015. Needs refills of inhalers - Proair is not covered by [employer]; suggested is Ventolin.. Having trouble with increased SOB.      Sleep tests PSG 04/10/06>> RDI 105  CPAP 06/13/11 to 07/10/11>>Used on 20 of 28 nights with median 3 hrs 45 min.  Average AHI 5 with median CPAP 7 cm H2O and 95th percentile CPAP 10 cm H2O.  Pulmonary tests PFT 05/12/12>>FEV1 1.83 (98%), FEV1% 80, TLC 3.83 (98%), DLCO 61% (corrects for lung volumes), no BD response PFT 06/30/14 >> FEV1 1.80 (93%), FEV1% 86,  TLC 4.35 (108%), DLCO 82%, no BD Spirometry 05/11/16 >> FEV1 1.74 (89%), FEV1% 76   Cardiac tests Echo 04/25/15 >> EF 34%, grade 2 diastolic CHF, mod MR, PAS 45 mmHg  Past medical hx Combined CHF, CAD s/p CABG, CVA, DM, HLD, HTN, Fibromyalgia, GERD, Goiter, Pneumonia, Seizures  Past surgical hx, Allergies, Family hx, Social hx all reviewed.  Vital Signs BP 124/80 (BP Location: Right Arm, Cuff Size: Normal)   Pulse 75   Ht '4\' 10"'$  (1.473 m)   Wt 141 lb 6.4 oz (64.1 kg)   LMP 09/02/2003   SpO2 96%   BMI 29.55 kg/m   History of Present Illness Diane [name] is a 60 y.o. female smoker with chronic bronchitis, OSA.  I last saw her in 2015.  She continues to smoke cigarettes 1.5 packs per day.  She is working with [facility]  program for smoking cessation and using nicotine patches.  She gets cough with intermittent wheezing.  She was on proventil, but had to change to ventolin due to insurance coverage.  She feels ventolin works better.  She tried spiriva and advair before >> not much help.  She denies chest pain, fever, or hemoptysis.  She stopped using CPAP several years ago.  She still has snoring, and feels sleepy during the day.  Physical Exam  General - No distress ENT - No sinus tenderness, no oral exudate, no LAN Cardiac - s1s2 regular, no murmur Chest - No wheeze/rales/dullness Back - No focal tenderness Abd - Soft, non-tender Ext - No edema Neuro - Normal strength Skin - No rashes Psych - normal mood, and behavior   Assessment/Plan  Chronic bronchitis. - will have her try breo - continue prn ventolin - prevnar shot today  Obstructive sleep apnea. - discussed how untreated sleep apnea can impact her cardiac function - will arrange for in lab sleep study to further assess  Tobacco abuse. - advised her to continue with her smoking cessation regimen   Patient Instructions  Breo one puff daily >> rinse mouth after each use Ventolin two puffs every 6 hours as needed for cough, wheeze, or chest congestion Will schedule sleep study  Follow up in 4 months    [name], MD [facility] Pager:  [phone] 05/11/2016, 12:20 PM

## 2016-05-11 NOTE — Addendum Note (Signed)
Addended by: Virl Cagey on: 05/11/2016 04:49 PM   Modules accepted: Orders

## 2016-05-14 ENCOUNTER — Telehealth: Payer: Self-pay | Admitting: Pulmonary Disease

## 2016-05-14 MED ORDER — ALBUTEROL SULFATE (2.5 MG/3ML) 0.083% IN NEBU: 2.5000 mg | INHALATION_SOLUTION | Freq: Four times a day (QID) | RESPIRATORY_TRACT | 5 refills | Status: DC

## 2016-05-14 NOTE — Telephone Encounter (Signed)
Called spoke with pt. She will call insurance to find out if those are covered and call us back. Will await call back.

## 2016-05-14 NOTE — Telephone Encounter (Signed)
Can we find out from insurance what is covered? OK to use Advair 250/50 or Dulera 100/5  if covered.

## 2016-05-14 NOTE — Telephone Encounter (Signed)
Called spoke with pt. She reports symbicort is not covered on her insurance. Please advise thanks

## 2016-05-14 NOTE — Telephone Encounter (Signed)
Received PA information for Albuterol Sulfate Neb 0.083% Called [facility] [phone] - per pharmacist this medication needs to be filed through Medicare Part B.  Pharmacist requests that the patient bring her Medicare (red, white and blue) card up to the pharmacy so that they can run the medication request through this part of insurance - will be covered under Part B. A new Rx has been sent to the pharmacy with a dx code attached.   Called to make patient aware that Rx has been re-sent and she needs to go back to the pharmacy with her Medicare Card so that they can run it back through her insurance. Pt expressed understanding. Nothing further needed for this issue.

## 2016-05-14 NOTE — Telephone Encounter (Signed)
Pt c/o sore throat x 1 day. Started taking Breo 100 on Friday 05/11/2016. Pt states that she has been rinsing and gargling after each use. Pt states that this morning she woke up with a sore throat - feels raw on the left side. Denies any redness or white patches in her mouth or throat. Aware that we will send this message to Dr Vaughan Browner who is the DOD since Dr Halford Chessman is not available this afternoon. Please advise Dr Vaughan Browner. Thanks.

## 2016-05-14 NOTE — Telephone Encounter (Signed)
D/C breo. Try symbicort 80/4.5 bid

## 2016-05-15 ENCOUNTER — Ambulatory Visit (HOSPITAL_BASED_OUTPATIENT_CLINIC_OR_DEPARTMENT_OTHER): Payer: Medicare Other | Attending: Pulmonary Disease | Admitting: Pulmonary Disease

## 2016-05-15 DIAGNOSIS — G4736 Sleep related hypoventilation in conditions classified elsewhere: Secondary | ICD-10-CM | POA: Insufficient documentation

## 2016-05-15 DIAGNOSIS — J988 Other specified respiratory disorders: Secondary | ICD-10-CM

## 2016-05-15 DIAGNOSIS — E119 Type 2 diabetes mellitus without complications: Secondary | ICD-10-CM | POA: Insufficient documentation

## 2016-05-15 DIAGNOSIS — I10 Essential (primary) hypertension: Secondary | ICD-10-CM | POA: Insufficient documentation

## 2016-05-15 DIAGNOSIS — J449 Chronic obstructive pulmonary disease, unspecified: Secondary | ICD-10-CM | POA: Insufficient documentation

## 2016-05-15 DIAGNOSIS — R0683 Snoring: Secondary | ICD-10-CM | POA: Diagnosis not present

## 2016-05-15 DIAGNOSIS — G4733 Obstructive sleep apnea (adult) (pediatric): Secondary | ICD-10-CM | POA: Insufficient documentation

## 2016-05-15 DIAGNOSIS — G4761 Periodic limb movement disorder: Secondary | ICD-10-CM | POA: Diagnosis not present

## 2016-05-17 ENCOUNTER — Telehealth: Payer: Self-pay | Admitting: Pulmonary Disease

## 2016-05-17 NOTE — Telephone Encounter (Signed)
Albuterol Sulfate is not covered by pt's insurance. [employer] sent over the form that will need to be filled out to file PA. This form has been filled out the best of my ability and placed in VS look at to be signed. Will route message to [place] for follow up.

## 2016-05-19 DIAGNOSIS — G4733 Obstructive sleep apnea (adult) (pediatric): Secondary | ICD-10-CM | POA: Diagnosis not present

## 2016-05-20 ENCOUNTER — Telehealth: Payer: Self-pay | Admitting: Pulmonary Disease

## 2016-05-20 DIAGNOSIS — G4733 Obstructive sleep apnea (adult) (pediatric): Secondary | ICD-10-CM

## 2016-05-20 DIAGNOSIS — G4761 Periodic limb movement disorder: Secondary | ICD-10-CM | POA: Insufficient documentation

## 2016-05-20 DIAGNOSIS — J988 Other specified respiratory disorders: Secondary | ICD-10-CM | POA: Insufficient documentation

## 2016-05-20 DIAGNOSIS — G4736 Sleep related hypoventilation in conditions classified elsewhere: Secondary | ICD-10-CM | POA: Insufficient documentation

## 2016-05-20 DIAGNOSIS — J449 Chronic obstructive pulmonary disease, unspecified: Secondary | ICD-10-CM | POA: Insufficient documentation

## 2016-05-20 NOTE — Telephone Encounter (Signed)
PSG 05/15/16 >> AHI 1.9, SpO2 low 83%, Spent 85.1 min with SpO2 < 88%, PLMI 40.34   Will have my nurse inform pt that sleep study didn't show significant sleep apnea >> likely improved since she has lost almost 25 lbs since original sleep study in 2007.  Sleep study did show signs of low oxygen at night.  She needs to have ONO on room air.  I will call her with results of this, and then arrange for ROV.

## 2016-05-20 NOTE — Procedures (Signed)
    Patient Name: Diane [name], [place] Date: 05/15/2016 Gender: Female D.O.B: 31-Jul-1956 Age (years): 73 Referring Provider: [name] MD, ABSM Height (inches): 5 Interpreting Physician: [name] MD, ABSM Weight (lbs): 140 RPSGT: Jonna Coup BMI: 29 MRN: [id number] Neck Size: 14.00  CLINICAL INFORMATION Sleep Study Type: NPSG   Indication for sleep study: She has prior history of OSA, and was on CPAP.  She has lost about 25 lbs since original sleep study.  She still has daytime fatigue.  She also has history of HTN, DM, and COPD.  She is referred to sleep lab to assess status of sleep disordered breathing.   Epworth Sleepiness Score: 7   SLEEP STUDY TECHNIQUE As per the AASM Manual for the Scoring of Sleep and Associated Events v2.3 (April 2016) with a hypopnea requiring 4% desaturations. The channels recorded and monitored were frontal, central and occipital EEG, electrooculogram (EOG), submentalis EMG (chin), nasal and oral airflow, thoracic and abdominal wall motion, anterior tibialis EMG, snore microphone, electrocardiogram, and pulse oximetry.  MEDICATIONS Patient's medications include: reviewed in electronic medical record. Medications self-administered by patient during sleep study : No sleep medicine administered.  SLEEP ARCHITECTURE The study was initiated at 9:48:54 PM and ended at 3:49:22 AM. Sleep onset time was 26.4 minutes and the sleep efficiency was 77.6%. The total sleep time was 279.6 minutes. Stage REM latency was 240.5 minutes. The patient spent 1.97% of the night in stage N1 sleep, 84.98% in stage N2 sleep, 3.76% in stage N3 and 9.30% in REM. Alpha intrusion was absent. Supine sleep was 26.11%.  RESPIRATORY PARAMETERS The overall apnea/hypopnea index (AHI) was 1.9 per hour. There were 0 total apneas, including 0 obstructive, 0 central and 0 mixed apneas. There were 9 hypopneas and 5 RERAs. The AHI during Stage REM sleep was 2.3 per hour. AHI  while supine was 3.3 per hour. The mean oxygen saturation was 89.83%. The minimum SpO2 during sleep was 83.00%.  She spent 85.1 minutes of test time with an SpO2 < 88%.  Supplemental oxygen was not used during the study. Moderate snoring was noted during this study.  CARDIAC DATA The 2 lead EKG demonstrated sinus rhythm. The mean heart rate was 82.68 beats per minute. Other EKG findings include: None.  LEG MOVEMENT DATA The total PLMS were 188 with a resulting PLMS index of 40.34. Associated arousal with leg movement index was 2.8 .  IMPRESSIONS - While she had several obstructive respiratory events, these were not frequent enough to qualify for diagnosis of obstructive sleep apnea.  Her overall AHI was 1.9. - She had an SpO2 low of 83%, and spent 85.1 minutes of test time with an SpO2 < 88%. - She had an increase in her periodic limb movement index.  DIAGNOSIS - Sleep Related Hypoventilation Due to Lower Airway Obstruction (J22, G47.36 ICD 10) - COPD with Chronic Bronchitis (J44.9 ICD 10) - Periodic Limb Movements of Sleep (G47.61 ICD 10)   RECOMMENDATIONS - She should be assessed for nocturnal supplemental oxygen therapy. - She should be assessed for presence of restless leg syndrome.  [Electronically signed] 05/20/2016 08:30 AM  [name] MD, ABSM Diplomate, American Board of Sleep Medicine   NPI: [id number]

## 2016-05-22 ENCOUNTER — Ambulatory Visit (INDEPENDENT_AMBULATORY_CARE_PROVIDER_SITE_OTHER): Payer: Medicare Other | Admitting: Pharmacist

## 2016-05-22 ENCOUNTER — Encounter: Payer: Self-pay | Admitting: Pharmacist

## 2016-05-22 VITALS — BP 108/60 | HR 78 | Ht <= 58 in | Wt 140.6 lb

## 2016-05-22 DIAGNOSIS — I509 Heart failure, unspecified: Secondary | ICD-10-CM

## 2016-05-22 DIAGNOSIS — I6350 Cerebral infarction due to unspecified occlusion or stenosis of unspecified cerebral artery: Secondary | ICD-10-CM | POA: Diagnosis not present

## 2016-05-22 MED ORDER — ISOSORBIDE MONONITRATE ER 30 MG PO TB24: 30.0000 mg | ORAL_TABLET | Freq: Every day | ORAL | 2 refills | Status: DC

## 2016-05-22 MED ORDER — SACUBITRIL-VALSARTAN 97-103 MG PO TABS: 1.0000 | ORAL_TABLET | Freq: Two times a day (BID) | ORAL | 1 refills | Status: DC

## 2016-05-22 NOTE — Progress Notes (Signed)
Patient ID: Diane [name]                 DOB: 09/07/1956                      MRN: [id number]     HPI: Diane [name] is a 60 y.o. female patient of Dr. Martinique with PMH below who presents today for Entresto titration. At her last visit with me several medication issues were addressed. Her Ranexa was increased after consultation with Dr. Martinique (she is on phenytoin which decreases levels of Ranexa significantly). She was started on Entresto and losartan was discontinued. A prior authorization was submitted for spray nitroglycerin as well.   She states that she is experiencing less chest pain and feels overall better since medication changes made at last visit. She denies any dizziness (though she experiences this occasionally she does not attribute this to low blood pressure but rather her other long standing health conditions).   She states her pressures have been in the normal range since starting the Johnson. She also reports that [name] will be affordable for her based on her copay.   She also reports that pulmonary ordered a sleep study which showed no sleep apnea, but revealed low levels of oxygen at night so she will have a home study to evaluate her saturation levels.    Cardiac Hx: CHF (EF 35%), HTN, atherosclerosis, CAD, OSA, DM, HLD, tobacco abuse, hx of CVA  Current HTN meds:  Carvedilol '25mg'$  BID Furosemide '40mg'$  once daily HCTZ '50mg'$  once daily Isosorbide mononitrate '30mg'$  BID Entresto 49/'51mg'$  BID  BP goal: <140/90  Family History: She reports that her mother had a few heart attacks. She does not recall how old she was when she had her first event, but states everyone on that side has heart problems. Her father has no cardiac related issues that she is aware of and her sister only has hypertension.   Social History: She is a current smoker (about 1.5 ppd). She is working with the [employer]. She reports that she has been thinking about quitting cold [place], but has not  yet set a quit date. In the past she has tried quitting with patches, gum, Chantix, and Zyban. Her most recent quit attempt was July 15th and her longest quit attempt was a few years ago was a couple of months.   She has not had any alcohol for 2 years and she has been seizure free for 2 years as well.   Diet: She eats most of her meals at home. She cooks with very little salt and rarely adds salt at the table. She drinks only decaf coffee. She denies tea and soda. She drinks only ginger ale for her recent nausea.   Exercise: very little exercise. She reports trying to get to the pool once or twice a week. When she does go she goes for about 45 minutes.   Home BP readings: She has been monitoring but did not bring log with her today. She reports all WNL except one day her diastolic was in the 89F.   Wt Readings from Last 3 Encounters:  05/15/16 140 lb (63.5 kg)  05/11/16 141 lb 6.4 oz (64.1 kg)  05/08/16 139 lb 9.6 oz (63.3 kg)   BP Readings from Last 3 Encounters:  05/22/16 108/60  05/11/16 124/80  05/08/16 122/68   Pulse Readings from Last 3 Encounters:  05/22/16 78  05/11/16 75  05/08/16 74  Renal function: CrCl cannot be calculated (Patient's most recent lab result is older than the maximum 21 days allowed.).  Past Medical History:  Diagnosis Date  . Anemia   . Carotid stenosis   . CHF (congestive heart failure) ([place])   . Chronic bronchitis ([place])   . Coronary artery disease    post CABG in 3/07   . CVA (cerebral infarction) 1993  . Diabetes mellitus    type 2  . Dyslipidemia   . Fibromyalgia   . GERD (gastroesophageal reflux disease)   . Goiter   . Headache    hx of   . Hyperlipidemia   . Hypertension   . Myocardial infarction ([place])   . Pneumonia    hx of   . PONV (postoperative nausea and vomiting)    only once was patient sick in the 1980s   . Seizures ([place])    last seizure- 03/2013   . Shortness of breath dyspnea    with exertion or when fluid builds  up   . Sleep apnea    used to wear a cpap- not used in 3 years   . Stroke ([place]) 1993  . Systolic murmur    known mild AS and MR  . Tobacco abuse     Current Outpatient Prescriptions on File Prior to Visit  Medication Sig Dispense Refill  . albuterol (PROVENTIL) (2.5 MG/3ML) 0.083% nebulizer solution Take 3 mLs (2.5 mg total) by nebulization every 6 (six) hours. Dx: O11 - chronic bronchitis 75 mL 5  . albuterol (VENTOLIN HFA) 108 (90 Base) MCG/ACT inhaler Inhale 2 puffs into the lungs every 6 (six) hours as needed for wheezing or shortness of breath. 1 Inhaler 5  . aspirin EC 81 MG tablet Take 1 tablet (81 mg total) by mouth daily. 30 tablet 6  . carvedilol (COREG) 25 MG tablet Take 1 tablet (25 mg total) by mouth 2 (two) times daily with a meal. 180 tablet 3  . clopidogrel (PLAVIX) 75 MG tablet TAKE 1 TABLET BY MOUTH EVERY DAY 90 tablet 0  . cyclobenzaprine (FLEXERIL) 10 MG tablet Take 10 mg by mouth at bedtime as needed.     [name] DEXILANT 60 MG capsule TAKE 1 CAPSULE (60 MG TOTAL) BY MOUTH DAILY. 30 capsule 9  . diphenhydrAMINE (BENADRYL) 25 mg capsule Take 2 capsules in AM and 1 capsule at night 270 capsule 3  . ferrous sulfate 325 (65 FE) MG tablet Take 325 mg by mouth daily with breakfast.    . fluticasone (FLONASE) 50 MCG/ACT nasal spray Place 2 sprays into both nostrils daily. 16 g 2  . fluticasone furoate-vilanterol (BREO ELLIPTA) 100-25 MCG/INH AEPB Inhale 1 puff into the lungs daily. 30 each 5  . furosemide (LASIX) 40 MG tablet Take 1 tablet (40 mg total) by mouth daily. 90 tablet 3  . gabapentin (NEURONTIN) 300 MG capsule Take 2 capsules (600 mg total) by mouth at bedtime. 60 capsule 11  . glimepiride (AMARYL) 2 MG tablet Take 2 mg by mouth 2 (two) times daily.     [name] guaiFENesin (MUCINEX) 600 MG 12 hr tablet Take 2 tablets (1,200 mg total) by mouth 2 (two) times daily. 360 tablet 3  . hydrochlorothiazide (HYDRODIURIL) 50 MG tablet Take 1 tablet (50 mg total) by mouth daily. 30 tablet  5  . levETIRAcetam (KEPPRA) 750 MG tablet TAKE 1 TABLET (750 MG TOTAL) BY MOUTH 2 (TWO) TIMES DAILY. 60 tablet 11  . metFORMIN (GLUCOPHAGE-XR) 500 MG 24 hr tablet Take  1,000 mg by mouth 2 (two) times daily.  6  . nicotine (NICODERM CQ - DOSED IN MG/24 HOURS) 21 mg/24hr patch Place 21 mg onto the skin daily.    . nitroGLYCERIN (NITROSTAT) 0.4 MG SL tablet Place 1 tablet (0.4 mg total) under the tongue every 5 (five) minutes as needed. 25 tablet 11  . phenytoin (DILANTIN) 100 MG ER capsule Take 1 capsule (100 mg total) by mouth 2 (two) times daily. 60 capsule 11  . potassium chloride (K-DUR) 10 MEQ tablet Take 1 tablet (10 mEq total) by mouth 2 (two) times daily. 180 tablet 0  . ranolazine (RANEXA) 1000 MG SR tablet Take 1 tablet (1,000 mg total) by mouth 2 (two) times daily. 180 tablet 1  . rosuvastatin (CRESTOR) 10 MG tablet Take 1 tablet (10 mg total) by mouth daily. 90 tablet 3  . vitamin B-12 (CYANOCOBALAMIN) 1000 MCG tablet Take 1,000 mcg by mouth daily.    . VOLTAREN 1 % GEL Apply 2 g topically as needed (pain).      No current facility-administered medications on file prior to visit.     Allergies  Allergen Reactions  . Ace Inhibitors     REACTION: cough  . Bee Venom Itching and Swelling  . Tetracycline     REACTION: reaction not known    Blood pressure 108/60, pulse 78, last menstrual period 09/02/2003.   Assessment/Plan: Heart Failure: BP is on the low side. Will decrease isosorbide to titrate Entresto to target dose. Start taking Isosorbide ER '30mg'$  daily and increase Entresto 97/'103mg'$  BID. Monitor blood pressure twice daily. Will follow-up on coverage of spray nitroglycerin and call patient when approved. She will call with any concerns and we will check in on her when she comes to see Dr. Gwenlyn Found in 3 weeks.   Thank you, Lelan Pons. Patterson Hammersmith, [facility]  05/22/2016 3:36 PM

## 2016-05-22 NOTE — Telephone Encounter (Signed)
Diane [name], have you received this form back? thanks

## 2016-05-22 NOTE — Patient Instructions (Addendum)
Return for a a follow up appointment in 2 weeks  Check your blood pressure at home daily (if able) and keep record of the readings.  Take your BP meds as follows: Decrease isosorbide to 1 tablet once a day Increase Entresto to 2 tablets twice a day of your current supply then start higher strength 1 tablet twice a day  Bring all of your meds, your BP cuff and your record of home blood pressures to your next appointment.  Exercise as you're able, try to walk approximately 30 minutes per day.  Keep salt intake to a minimum, especially watch canned and prepared boxed foods.  Eat more fresh fruits and vegetables and fewer canned items.  Avoid eating in fast food restaurants.    HOW TO TAKE YOUR BLOOD PRESSURE: . Rest 5 minutes before taking your blood pressure. .  Don't smoke or drink caffeinated beverages for at least 30 minutes before. . Take your blood pressure before (not after) you eat. . Sit comfortably with your back supported and both feet on the floor (don't cross your legs). . Elevate your arm to heart level on a table or a desk. . Use the proper sized cuff. It should fit smoothly and snugly around your bare upper arm. There should be enough room to slip a fingertip under the cuff. The bottom edge of the cuff should be 1 inch above the crease of the elbow. . Ideally, take 3 measurements at one sitting and record the average.

## 2016-05-22 NOTE — Telephone Encounter (Signed)
Results have been explained to patient, pt expressed understanding. ONO ordered. Nothing further needed.

## 2016-05-23 ENCOUNTER — Other Ambulatory Visit: Payer: Self-pay | Admitting: Cardiology

## 2016-05-23 NOTE — Telephone Encounter (Signed)
Dr Halford Chessman do you still have this form to fill out/sign?

## 2016-05-25 NOTE — Telephone Encounter (Signed)
Form faxed back to [place] [phone] Will hold form until medication is approved.

## 2016-05-25 NOTE — Telephone Encounter (Signed)
Form signed.

## 2016-05-29 ENCOUNTER — Telehealth: Payer: Self-pay | Admitting: Cardiovascular Disease

## 2016-05-29 NOTE — Telephone Encounter (Signed)
Closed encounter °

## 2016-05-29 NOTE — Telephone Encounter (Signed)
Received Denial Letter for coverage of Albuterol neb.  Letter states that the this medication is covered under Medicare Part B and it was denied under Part D.  Pt needs to have medication ran through Medicare Part B We need to call [facility] and have them run this medication through Part B.  Called [place] with Diane [name]- Albuterol neb ran through Medicare Part B - this is covered and does not need a PA.  Nothing further needed.

## 2016-06-04 ENCOUNTER — Other Ambulatory Visit: Payer: Self-pay | Admitting: Family Medicine

## 2016-06-05 NOTE — Telephone Encounter (Signed)
Please schedule a winter visit (30 min) Refill until then

## 2016-06-05 NOTE — Telephone Encounter (Signed)
No recent/future appts, please advise

## 2016-06-06 NOTE — Telephone Encounter (Signed)
appt scheduled and med refilled 

## 2016-06-08 ENCOUNTER — Other Ambulatory Visit: Payer: Self-pay | Admitting: Cardiology

## 2016-06-08 ENCOUNTER — Telehealth: Payer: Self-pay | Admitting: Pharmacist

## 2016-06-08 MED ORDER — SACUBITRIL-VALSARTAN 97-103 MG PO TABS: 1.0000 | ORAL_TABLET | Freq: Two times a day (BID) | ORAL | 1 refills | Status: DC

## 2016-06-08 NOTE — Telephone Encounter (Signed)
Pt called stating pharmacy needs documentation that pt will be on higher dose of Entresto. Will resend prescription with note that she is actually taking higher dose. Pt states she appreciates help.

## 2016-06-12 ENCOUNTER — Ambulatory Visit: Payer: Self-pay | Admitting: Cardiovascular Disease

## 2016-06-12 DIAGNOSIS — G4733 Obstructive sleep apnea (adult) (pediatric): Secondary | ICD-10-CM | POA: Diagnosis not present

## 2016-06-20 ENCOUNTER — Telehealth: Payer: Self-pay | Admitting: Pharmacist Clinician (PhC)/ Clinical Pharmacy Specialist

## 2016-06-20 ENCOUNTER — Encounter: Payer: Self-pay | Admitting: Cardiovascular Disease

## 2016-06-20 ENCOUNTER — Ambulatory Visit (INDEPENDENT_AMBULATORY_CARE_PROVIDER_SITE_OTHER): Payer: Medicare Other | Admitting: Cardiovascular Disease

## 2016-06-20 ENCOUNTER — Telehealth: Payer: Self-pay | Admitting: Pulmonary Disease

## 2016-06-20 ENCOUNTER — Telehealth: Payer: Self-pay

## 2016-06-20 DIAGNOSIS — I6350 Cerebral infarction due to unspecified occlusion or stenosis of unspecified cerebral artery: Secondary | ICD-10-CM

## 2016-06-20 DIAGNOSIS — I779 Disorder of arteries and arterioles, unspecified: Secondary | ICD-10-CM

## 2016-06-20 DIAGNOSIS — J449 Chronic obstructive pulmonary disease, unspecified: Secondary | ICD-10-CM

## 2016-06-20 DIAGNOSIS — I739 Peripheral vascular disease, unspecified: Principal | ICD-10-CM

## 2016-06-20 DIAGNOSIS — G4733 Obstructive sleep apnea (adult) (pediatric): Secondary | ICD-10-CM

## 2016-06-20 NOTE — Telephone Encounter (Signed)
Patient on full dose Entresto 97/103.  Tolerating without side effects.  Patient in to see Dr. Gwenlyn Found today, BP was good, as were the list of home readings she brought with her.  Did have 2 readings > 737 systolic, but overall running 120-130 range.  Advised patient to continue with current dose, call the office should she become hypotensive.

## 2016-06-20 NOTE — Assessment & Plan Note (Signed)
Diane [name] has bilateral carotid artery stenosis. Her last Doppler performed 10/18/15 showed moderate least severe right and severe left ICA stenosis. She was felt not to be a good endarterectomy candidate because of her cardiac condition comorbidities. I performed cerebral angiography on her 11/15/15 revealing a 70% proximal right ICA stenosis with a bend beyond this involving a bifurcation and a 70% highly calcified left ICA stenosis with a sharp bend beyond that. I do not think that her left carotid was suitable for stenting given her anatomy and calcification. She apparently had a stroke since that time.

## 2016-06-20 NOTE — Progress Notes (Signed)
06/20/2016 Diane [name]   [birth year]  [id number]  Primary Physician Loura Pardon, MD Primary Cardiologist: Lorretta Harp MD Garret Reddish, [place], [place]  HPI:  Diane [name] is a 60 year old Caucasian female with a history of CAD status post coronary artery bypass grafting March 2007 by Dr. [name]. She's had subsequent stenting and known occluded grafts except for her her LIMA. She has severe LV dysfunction with moderate pulmonary hypertension. I last saw her in the office 11/03/15. Her other Problems include diabetes, hypokalemia, hypertension and ongoing tobacco abuse. She's had progression of her bilateral carotid artery stenosis left greater than right . She is neurologically asymptomatic. She is high risk for carotid endarterectomy. I performed cerebral angiography and HER-11/15/15 revealing 70% bilateral ICA stenosis with pins beyond the stenosis and significant calcification on the left making  stenting not appropriate. She apparently had a stroke since I saw her last documented by her primary care physician.    Current Outpatient Prescriptions  Medication Sig Dispense Refill  . aspirin EC 81 MG tablet Take 1 tablet (81 mg total) by mouth daily. 30 tablet 6  . carvedilol (COREG) 25 MG tablet TAKE 1 TABLET (25 MG TOTAL) BY MOUTH 2 (TWO) TIMES DAILY WITH A MEAL. 180 tablet 1  . clopidogrel (PLAVIX) 75 MG tablet TAKE 1 TABLET BY MOUTH EVERY DAY 90 tablet 0  . cyclobenzaprine (FLEXERIL) 10 MG tablet Take 10 mg by mouth at bedtime as needed.     [name] DEXILANT 60 MG capsule TAKE 1 CAPSULE (60 MG TOTAL) BY MOUTH DAILY. 30 capsule 2  . diphenhydrAMINE (BENADRYL) 25 mg capsule Take 2 capsules in AM and 1 capsule at night 270 capsule 3  . ferrous sulfate 325 (65 FE) MG tablet Take 325 mg by mouth daily with breakfast.    . fluticasone (FLONASE) 50 MCG/ACT nasal spray Place 2 sprays into both nostrils daily. 16 g 2  . fluticasone furoate-vilanterol (BREO ELLIPTA) 100-25 MCG/INH AEPB Inhale 1 puff  into the lungs daily. 30 each 5  . furosemide (LASIX) 40 MG tablet Take 1 tablet (40 mg total) by mouth daily. 90 tablet 3  . gabapentin (NEURONTIN) 300 MG capsule Take 2 capsules (600 mg total) by mouth at bedtime. 60 capsule 11  . glimepiride (AMARYL) 2 MG tablet Take 2 mg by mouth 2 (two) times daily.     [name] guaiFENesin (MUCINEX) 600 MG 12 hr tablet Take 2 tablets (1,200 mg total) by mouth 2 (two) times daily. 360 tablet 3  . hydrochlorothiazide (HYDRODIURIL) 50 MG tablet Take 1 tablet (50 mg total) by mouth daily. 30 tablet 5  . isosorbide mononitrate (IMDUR) 30 MG 24 hr tablet Take 1 tablet (30 mg total) by mouth daily. 180 tablet 2  . levETIRAcetam (KEPPRA) 750 MG tablet TAKE 1 TABLET (750 MG TOTAL) BY MOUTH 2 (TWO) TIMES DAILY. 60 tablet 11  . metFORMIN (GLUCOPHAGE-XR) 500 MG 24 hr tablet Take 1,000 mg by mouth 2 (two) times daily.  6  . nicotine (NICODERM CQ - DOSED IN MG/24 HOURS) 21 mg/24hr patch Place 21 mg onto the skin daily.    . nitroGLYCERIN (NITROSTAT) 0.4 MG SL tablet Place 1 tablet (0.4 mg total) under the tongue every 5 (five) minutes as needed. 25 tablet 11  . phenytoin (DILANTIN) 100 MG ER capsule Take 1 capsule (100 mg total) by mouth 2 (two) times daily. 60 capsule 11  . potassium chloride (K-DUR) 10 MEQ tablet Take 1 tablet (10 mEq total) by  mouth 2 (two) times daily. 180 tablet 0  . ranolazine (RANEXA) 1000 MG SR tablet Take 1 tablet (1,000 mg total) by mouth 2 (two) times daily. 180 tablet 1  . rosuvastatin (CRESTOR) 10 MG tablet Take 1 tablet (10 mg total) by mouth daily. 90 tablet 3  . sacubitril-valsartan (ENTRESTO) 97-103 MG Take 1 tablet by mouth 2 (two) times daily. 60 tablet 1  . vitamin B-12 (CYANOCOBALAMIN) 1000 MCG tablet Take 1,000 mcg by mouth daily.    . VOLTAREN 1 % GEL Apply 2 g topically as needed (pain).     [name] ENTRESTO 49-51 MG Take 1 tablet by mouth 2 (two) times daily.    . VENTOLIN HFA 108 (90 Base) MCG/ACT inhaler Inhale 1 puff into the lungs as  needed.     No current facility-administered medications for this visit.     Allergies  Allergen Reactions  . Bee Venom Itching and Swelling  . Tetracycline     REACTION: reaction not known  . Ace Inhibitors Cough    Social History   Social History  . Marital status: Divorced    Spouse name: N/A  . Number of children: N/A  . Years of education: N/A   Occupational History  . Not on file.   Social History Main Topics  . Smoking status: Current Every Day Smoker    Packs/day: 1.50    Years: 49.00    Types: Cigarettes  . Smokeless tobacco: Never Used     Comment: 1.5 pack to 2 packs per day  . Alcohol use No  . Drug use: No  . Sexual activity: Not Currently    Partners: Male    Birth control/ protection: None   Other Topics Concern  . Not on file   Social History Narrative  . No narrative on file     Review of Systems: General: negative for chills, fever, night sweats or weight changes.  Cardiovascular: negative for chest pain, dyspnea on exertion, edema, orthopnea, palpitations, paroxysmal nocturnal dyspnea or shortness of breath Dermatological: negative for rash Respiratory: negative for cough or wheezing Urologic: negative for hematuria Abdominal: negative for nausea, vomiting, diarrhea, bright red blood per rectum, melena, or hematemesis Neurologic: negative for visual changes, syncope, or dizziness All other systems reviewed and are otherwise negative except as noted above.    Blood pressure 120/62, pulse 76, height '4\' 10"'  (1.473 m), weight 140 lb 12.8 oz (63.9 kg), last menstrual period 09/02/2003, SpO2 96 %.  General appearance: alert and no distress Neck: no adenopathy, no JVD, supple, symmetrical, trachea midline, thyroid not enlarged, symmetric, no tenderness/mass/nodules and Right carotid bruit Lungs: clear to auscultation bilaterally Heart: regular rate and rhythm, S1, S2 normal, no murmur, click, rub or gallop Extremities: extremities normal,  atraumatic, no cyanosis or edema  EKG normal sinus rhythm at 76 with inferior Q waves and nonspecific ST-T wave changes with poor R-wave progression. I personally reviewed this EKG  ASSESSMENT AND PLAN:   Carotid artery disease [facility]) Diane [name] has bilateral carotid artery stenosis. Her last Doppler performed 10/18/15 showed moderate least severe right and severe left ICA stenosis. She was felt not to be a good endarterectomy candidate because of her cardiac condition comorbidities. I performed cerebral angiography on her 11/15/15 revealing a 70% proximal right ICA stenosis with a bend beyond this involving a bifurcation and a 70% highly calcified left ICA stenosis with a sharp bend beyond that. I do not think that her left carotid was suitable for stenting given  her anatomy and calcification. She apparently had a stroke since that time.      Lorretta Harp MD FACP,FACC,FAHA, [facility] 06/20/2016 12:20 PM

## 2016-06-20 NOTE — Addendum Note (Signed)
Addended by: Vanessa Ralphs on: 06/20/2016 12:28 PM   Modules accepted: Orders

## 2016-06-20 NOTE — Telephone Encounter (Signed)
Manus Gunning with [facility] left v/m requesting cb if Dr Glori Bickers would be the attending physician for pt to participate in a pilot program called [facility] which is a home based palliative care program.Please advise.

## 2016-06-20 NOTE — Telephone Encounter (Signed)
ONO with RA 06/12/16 >> test time 5 hrs 55 min.  Mean SpO2 91.4%, low SpO2 84%.  Spent 42 min with SpO2 < 88%.   Will have my nurse inform pt that her oxygen is low while asleep.  She needs to use supplemental oxygen at night.  Please arrange for 2 liters supplemental oxygen at night.

## 2016-06-20 NOTE — Telephone Encounter (Signed)
They do not have an attending over this new program so I advise hospice of DR. Tower's comments

## 2016-06-20 NOTE — Telephone Encounter (Signed)
I have no problem with that - however if it is better to have someone attend who is more familiar with the new program, I would understand also   Thanks

## 2016-06-20 NOTE — Patient Instructions (Signed)
Medication Instructions:  NO CHANGES.   Testing/Procedures: Your physician has requested that you have a carotid duplex. This test is an ultrasound of the carotid arteries in your neck. It looks at blood flow through these arteries that supply the brain with blood. Allow one hour for this exam. There are no restrictions or special instructions.    Follow-Up: Your physician recommends that you schedule a follow-up appointment in: [place].   If you need a refill on your cardiac medications before your next appointment, please call your pharmacy.

## 2016-06-21 ENCOUNTER — Other Ambulatory Visit: Payer: Self-pay | Admitting: Cardiology

## 2016-06-22 ENCOUNTER — Other Ambulatory Visit: Payer: Self-pay | Admitting: *Deleted

## 2016-06-22 MED ORDER — POTASSIUM CHLORIDE ER 10 MEQ PO TBCR: 10.0000 meq | EXTENDED_RELEASE_TABLET | Freq: Two times a day (BID) | ORAL | 3 refills | Status: DC

## 2016-06-25 ENCOUNTER — Other Ambulatory Visit: Payer: Self-pay | Admitting: Cardiology

## 2016-06-26 ENCOUNTER — Ambulatory Visit (HOSPITAL_COMMUNITY)
Admission: RE | Admit: 2016-06-26 | Discharge: 2016-06-26 | Disposition: A | Discharge status: Home / Self Care | Payer: Medicare Other | Source: Ambulatory Visit | Attending: Cardiovascular Disease | Admitting: Cardiovascular Disease

## 2016-06-26 DIAGNOSIS — E119 Type 2 diabetes mellitus without complications: Secondary | ICD-10-CM | POA: Diagnosis not present

## 2016-06-26 DIAGNOSIS — E785 Hyperlipidemia, unspecified: Secondary | ICD-10-CM | POA: Diagnosis not present

## 2016-06-26 DIAGNOSIS — Z720 Tobacco use: Secondary | ICD-10-CM | POA: Insufficient documentation

## 2016-06-26 DIAGNOSIS — I739 Peripheral vascular disease, unspecified: Secondary | ICD-10-CM

## 2016-06-26 DIAGNOSIS — Z951 Presence of aortocoronary bypass graft: Secondary | ICD-10-CM | POA: Insufficient documentation

## 2016-06-26 DIAGNOSIS — I2510 Atherosclerotic heart disease of native coronary artery without angina pectoris: Secondary | ICD-10-CM | POA: Diagnosis not present

## 2016-06-26 DIAGNOSIS — I779 Disorder of arteries and arterioles, unspecified: Secondary | ICD-10-CM | POA: Diagnosis not present

## 2016-06-26 DIAGNOSIS — J449 Chronic obstructive pulmonary disease, unspecified: Secondary | ICD-10-CM | POA: Insufficient documentation

## 2016-06-26 DIAGNOSIS — I10 Essential (primary) hypertension: Secondary | ICD-10-CM | POA: Insufficient documentation

## 2016-06-26 DIAGNOSIS — I6523 Occlusion and stenosis of bilateral carotid arteries: Secondary | ICD-10-CM | POA: Diagnosis not present

## 2016-06-27 NOTE — Telephone Encounter (Signed)
LM x 1 

## 2016-06-28 ENCOUNTER — Other Ambulatory Visit: Payer: Self-pay | Admitting: *Deleted

## 2016-06-28 DIAGNOSIS — I739 Peripheral vascular disease, unspecified: Principal | ICD-10-CM

## 2016-06-28 DIAGNOSIS — I779 Disorder of arteries and arterioles, unspecified: Secondary | ICD-10-CM

## 2016-06-28 NOTE — Progress Notes (Signed)
Notes Recorded by Vanessa Ralphs, RN on 06/28/2016 at 10:58 AM EDT Results released to My Chart. Repeat order entered. ------  Notes Recorded by Lorretta Harp, MD on 06/28/2016 at 7:01 AM EDT No change from prior study. Repeat in 12 months.

## 2016-06-29 ENCOUNTER — Encounter: Payer: Self-pay | Admitting: Pulmonary Disease

## 2016-06-30 ENCOUNTER — Other Ambulatory Visit: Payer: Self-pay | Admitting: Cardiology

## 2016-07-02 ENCOUNTER — Encounter (HOSPITAL_BASED_OUTPATIENT_CLINIC_OR_DEPARTMENT_OTHER): Payer: Self-pay

## 2016-07-03 NOTE — Telephone Encounter (Signed)
Rx request sent to pharmacy.  

## 2016-07-04 NOTE — Telephone Encounter (Signed)
Spoke with pt. She is aware of results. Order will be placed for night time oxygen. Nothing further was needed.

## 2016-07-04 NOTE — Telephone Encounter (Signed)
Pt returning call.Diane [name] ° °

## 2016-07-07 ENCOUNTER — Other Ambulatory Visit: Payer: Self-pay | Admitting: Cardiovascular Disease

## 2016-07-09 ENCOUNTER — Other Ambulatory Visit: Payer: Self-pay | Admitting: Neurology

## 2016-07-09 ENCOUNTER — Telehealth: Payer: Self-pay | Admitting: Pulmonary Disease

## 2016-07-09 NOTE — Telephone Encounter (Signed)
Called and spoke to [place] with [facility]. Pt is needing a face-to-face for insurance to pay for pt's nocturnal O2. During the office visit the pt's s/s and diagnosis will need to be mentioned along with the need for nocturnal O2. Called and spoke to pt. Appt made with TP on 10.11.17. Pt verbalized understanding and denied any further questions or concerns at this time.

## 2016-07-09 NOTE — Telephone Encounter (Signed)
  PLAN: 1.  Continue Keppra '750mg'$  twice daily

## 2016-07-11 ENCOUNTER — Encounter: Payer: Self-pay | Admitting: Neurology

## 2016-07-11 ENCOUNTER — Ambulatory Visit (INDEPENDENT_AMBULATORY_CARE_PROVIDER_SITE_OTHER): Payer: Medicare Other | Admitting: Adult Health

## 2016-07-11 ENCOUNTER — Encounter: Payer: Self-pay | Admitting: Adult Health

## 2016-07-11 DIAGNOSIS — J9610 Chronic respiratory failure, unspecified whether with hypoxia or hypercapnia: Secondary | ICD-10-CM | POA: Insufficient documentation

## 2016-07-11 DIAGNOSIS — I6350 Cerebral infarction due to unspecified occlusion or stenosis of unspecified cerebral artery: Secondary | ICD-10-CM

## 2016-07-11 DIAGNOSIS — J9611 Chronic respiratory failure with hypoxia: Secondary | ICD-10-CM | POA: Diagnosis not present

## 2016-07-11 DIAGNOSIS — J449 Chronic obstructive pulmonary disease, unspecified: Secondary | ICD-10-CM

## 2016-07-11 DIAGNOSIS — G4733 Obstructive sleep apnea (adult) (pediatric): Secondary | ICD-10-CM

## 2016-07-11 DIAGNOSIS — J4489 Other specified chronic obstructive pulmonary disease: Secondary | ICD-10-CM

## 2016-07-11 NOTE — Progress Notes (Signed)
Reviewed and agree with assessment/plan.  [name], MD [facility] 07/11/2016, 11:19 AM Pager:  [phone]

## 2016-07-11 NOTE — Assessment & Plan Note (Signed)
ONO shows + nocturnal desats.   Plan  Patient Instructions  Begin Oxygen 2l/m At bedtime   Continue BREO daily , rinse after use.  Continue to work on not smoking .  Follow up Dr. Halford Chessman in 2 months  As planned and As needed

## 2016-07-11 NOTE — Patient Instructions (Addendum)
Begin Oxygen 2l/m At bedtime   Continue BREO daily , rinse after use.  Continue to work on not smoking .  Follow up Dr. Halford Chessman in 2 months  As planned and As needed

## 2016-07-11 NOTE — Assessment & Plan Note (Signed)
No OSA on recent sleep study

## 2016-07-11 NOTE — Progress Notes (Signed)
Subjective:    Patient ID: Diane [name], female    DOB: 08-11-1956, 60 y.o.   MRN: [id number]  HPI 60 yo female smoker with chronic bronchitis and nocturnal hypoxemia    Sleep tests PSG 04/10/06>> RDI 105  CPAP 06/13/11 to 07/10/11>>Used on 20 of 28 nights with median 3 hrs 45 min.  Average AHI 5 with median CPAP 7 cm H2O and 95th percentile CPAP 10 cm H2O.  Pulmonary tests PFT 05/12/12>>FEV1 1.83 (98%), FEV1% 80, TLC 3.83 (98%), DLCO 61% (corrects for lung volumes), no BD response PFT 06/30/14 >> FEV1 1.80 (93%), FEV1% 86, TLC 4.35 (108%), DLCO 82%, no BD Spirometry 05/11/16 >> FEV1 1.74 (89%), FEV1% 76   Cardiac tests Echo 04/25/15 >> EF 53%, grade 2 diastolic CHF, mod MR, PAS 45 mmHg   07/11/2016 Follow up : Chronic bronchitis and Nocturnal hypoxemia Pt returns for 3 months follow up . She was previously on CPAP years ago but stopped using this . Was seen in August for sleep evaluation with snoring and daytime sleepiness. She was set up for HST  PSG 05/15/16 did not show sleep apnea. But did show nocturnal hypoxemia. ONO 06/2016 showed desats on RA <88%.  She does feel short of breath. We discussed beginning nocutrnal O2.  Prevnar and PVX is utd . Getting flu shot at PCP .   Was started on BREO last ov , feels it has helped with cough and dyspnea. Still smoking . No weight loss or hemoptysis. CXR 2016 w/ clear lungs.  Discussed smoking cessation.    Past Medical History:  Diagnosis Date  . Anemia   . Carotid stenosis   . CHF (congestive heart failure) ([place])   . Chronic bronchitis ([place])   . Coronary artery disease    post CABG in 3/07   . CVA (cerebral infarction) 1993  . Diabetes mellitus    type 2  . Dyslipidemia   . Fibromyalgia   . GERD (gastroesophageal reflux disease)   . Goiter   . Headache    hx of   . Hyperlipidemia   . Hypertension   . Myocardial infarction   . Pneumonia    hx of   . PONV (postoperative nausea and vomiting)    only once was patient sick  in the 1980s   . Seizures ([place])    last seizure- 03/2013   . Shortness of breath dyspnea    with exertion or when fluid builds up   . Sleep apnea    used to wear a cpap- not used in 3 years   . Stroke ([place]) 1993  . Systolic murmur    known mild AS and MR  . Tobacco abuse    Current Outpatient Prescriptions on File Prior to Visit  Medication Sig Dispense Refill  . aspirin EC 81 MG tablet Take 1 tablet (81 mg total) by mouth daily. 30 tablet 6  . carvedilol (COREG) 25 MG tablet TAKE 1 TABLET (25 MG TOTAL) BY MOUTH 2 (TWO) TIMES DAILY WITH A MEAL. 180 tablet 1  . clopidogrel (PLAVIX) 75 MG tablet TAKE 1 TABLET BY MOUTH EVERY DAY 90 tablet 0  . cyclobenzaprine (FLEXERIL) 10 MG tablet Take 10 mg by mouth at bedtime as needed.     [name] DEXILANT 60 MG capsule TAKE 1 CAPSULE (60 MG TOTAL) BY MOUTH DAILY. 30 capsule 2  . diphenhydrAMINE (BENADRYL) 25 mg capsule Take 2 capsules in AM and 1 capsule at night 270 capsule 3  . ferrous  sulfate 325 (65 FE) MG tablet Take 325 mg by mouth daily with breakfast.    . fluticasone (FLONASE) 50 MCG/ACT nasal spray Place 2 sprays into both nostrils daily. 16 g 2  . fluticasone furoate-vilanterol (BREO ELLIPTA) 100-25 MCG/INH AEPB Inhale 1 puff into the lungs daily. 30 each 5  . furosemide (LASIX) 40 MG tablet Take 1 tablet (40 mg total) by mouth daily. 90 tablet 3  . gabapentin (NEURONTIN) 300 MG capsule Take 2 capsules (600 mg total) by mouth at bedtime. 60 capsule 11  . glimepiride (AMARYL) 2 MG tablet Take 2 mg by mouth 2 (two) times daily.     [name] guaiFENesin (MUCINEX) 600 MG 12 hr tablet Take 2 tablets (1,200 mg total) by mouth 2 (two) times daily. 360 tablet 3  . hydrochlorothiazide (HYDRODIURIL) 50 MG tablet TAKE 1 TABLET BY MOUTH DAILY 30 tablet 6  . isosorbide mononitrate (IMDUR) 30 MG 24 hr tablet Take 1 tablet (30 mg total) by mouth daily. 180 tablet 2  . KLOR-CON M10 10 MEQ tablet TAKE 1 TABLET (10 MEQ TOTAL) BY MOUTH 2 (TWO) TIMES DAILY. 180 tablet 0  .  levETIRAcetam (KEPPRA) 750 MG tablet TAKE 1 TABLET (750 MG TOTAL) BY MOUTH 2 (TWO) TIMES DAILY. 60 tablet 5  . metFORMIN (GLUCOPHAGE-XR) 500 MG 24 hr tablet Take 1,000 mg by mouth 2 (two) times daily.  6  . nicotine (NICODERM CQ - DOSED IN MG/24 HOURS) 21 mg/24hr patch Place 21 mg onto the skin daily.    . nitroGLYCERIN (NITROSTAT) 0.4 MG SL tablet Place 1 tablet (0.4 mg total) under the tongue every 5 (five) minutes as needed. 25 tablet 11  . phenytoin (DILANTIN) 100 MG ER capsule Take 1 capsule (100 mg total) by mouth 2 (two) times daily. 60 capsule 11  . potassium chloride (K-DUR) 10 MEQ tablet Take 1 tablet (10 mEq total) by mouth 2 (two) times daily. 180 tablet 3  . ranolazine (RANEXA) 1000 MG SR tablet Take 1 tablet (1,000 mg total) by mouth 2 (two) times daily. 180 tablet 1  . rosuvastatin (CRESTOR) 10 MG tablet TAKE 1 TABLET BY MOUTH DAILY 90 tablet 3  . sacubitril-valsartan (ENTRESTO) 97-103 MG Take 1 tablet by mouth 2 (two) times daily. 60 tablet 1  . VENTOLIN HFA 108 (90 Base) MCG/ACT inhaler Inhale 1 puff into the lungs as needed.    . vitamin B-12 (CYANOCOBALAMIN) 1000 MCG tablet Take 1,000 mcg by mouth daily.    . VOLTAREN 1 % GEL Apply 2 g topically as needed (pain).      No current facility-administered medications on file prior to visit.       Review of Systems Constitutional:   No  weight loss, night sweats,  Fevers, chills, +fatigue, or  lassitude.  HEENT:   No headaches,  Difficulty swallowing,  Tooth/dental problems, or  Sore throat,                No sneezing, itching, ear ache, nasal congestion, post nasal drip,   CV:  No chest pain,  Orthopnea, PND, swelling in lower extremities, anasarca, dizziness, palpitations, syncope.   GI  No heartburn, indigestion, abdominal pain, nausea, vomiting, diarrhea, change in bowel habits, loss of appetite, bloody stools.   Resp:    No excess mucus, no productive cough,  No non-productive cough,  No coughing up of blood.  No change  in color of mucus.  No wheezing.  No chest wall deformity  Skin: no rash or lesions.  GU: no dysuria, change in color of urine, no urgency or frequency.  No flank pain, no hematuria   MS:  No joint pain or swelling.  No decreased range of motion.  No back pain.  Psych:  No change in mood or affect. No depression or anxiety.  No memory loss.         Objective:   Physical Exam Vitals:   07/11/16 1031  BP: 134/78  Pulse: 77  Temp: 97.6 F (36.4 C)  TempSrc: Oral  SpO2: 97%  Weight: 142 lb (64.4 kg)  Height: '4\' 10"'$  (1.473 m)  Body mass index is 29.68 kg/m.   GEN: A/Ox3; pleasant , NAD, well nourished    HEENT:  [place]/AT,  EACs-clear, TMs-wnl, NOSE-clear, THROAT-clear, no lesions, no postnasal drip or exudate noted.   NECK:  Supple w/ fair ROM; no JVD; normal carotid impulses w/o bruits; no thyromegaly or nodules palpated; no lymphadenopathy.    RESP  Clear  P & A; w/o, wheezes/ rales/ or rhonchi. no accessory muscle use, no dullness to percussion  CARD:  RRR, no m/r/g  , no peripheral edema, pulses intact, no cyanosis or clubbing.  GI:   Soft & nt; nml bowel sounds; no organomegaly or masses detected.   Musco: Warm bil, no deformities or joint swelling noted.   Neuro: alert, no focal deficits noted.    Skin: Warm, no lesions or rashes  Evander Macaraeg NP-C  [facility]  07/11/2016        Assessment & Plan:

## 2016-07-11 NOTE — Assessment & Plan Note (Signed)
No significant COPD on PFT , smoking cessation is key  Cont on BREO w/ improved sx control

## 2016-07-23 ENCOUNTER — Telehealth: Payer: Self-pay | Admitting: Pulmonary Disease

## 2016-07-23 DIAGNOSIS — J449 Chronic obstructive pulmonary disease, unspecified: Secondary | ICD-10-CM

## 2016-07-23 NOTE — Telephone Encounter (Signed)
Called and spoke with Melissa from [facility].   She stated that the pt had an ONO and an OV in September and the pt had to have the oxygen delivered by Oct 11 to be in compliance with in the 30 day limit per medicare---the oxygen was delivered to the pt on oct 12 per the pts request.  So now we need to schedule an ONO to be done by next week in order to be in the 30 day window of compliance per her insurance.  VS please advise if ok to order the ONO.  thanks

## 2016-07-23 NOTE — Telephone Encounter (Signed)
Spoke with pt and advised of need for new ONO.  Order placed

## 2016-07-23 NOTE — Telephone Encounter (Signed)
Please inform pt of this issue and arrange for repeat overnight oximetry.

## 2016-07-27 ENCOUNTER — Encounter: Payer: Self-pay | Admitting: Pulmonary Disease

## 2016-07-27 DIAGNOSIS — G4733 Obstructive sleep apnea (adult) (pediatric): Secondary | ICD-10-CM | POA: Diagnosis not present

## 2016-08-02 ENCOUNTER — Telehealth: Payer: Self-pay

## 2016-08-02 NOTE — Telephone Encounter (Signed)
Mechele Claude nurse with [facility] left v/m; pt dx with chronic issue with arthritic pain in hip and knee; also has osteopenia. Mechele Claude wants to know if pt can take Tylenol for pain mgt. Mechele Claude request cb.

## 2016-08-02 NOTE — Telephone Encounter (Signed)
That would be fine 

## 2016-08-03 ENCOUNTER — Telehealth: Payer: Self-pay | Admitting: Pulmonary Disease

## 2016-08-03 NOTE — Telephone Encounter (Signed)
lmtcb X1 for pt  

## 2016-08-03 NOTE — Telephone Encounter (Signed)
Pt is requesting home sleep study results done by [facility]. Pt contact #  [phone].Diane [name]

## 2016-08-03 NOTE — Telephone Encounter (Signed)
Mechele Claude notified of Dr. Marliss Coots comments

## 2016-08-07 NOTE — Telephone Encounter (Signed)
ONO with RA 07/27/16 >> test time 6 hrs 25 min.  Mean SpO2 91.04%, low SpO2 68%.  Spent 18 min with SpO2 < 88%.   Will have my nurse inform pt that ONO shows her oxygen is low at night.  Please ensure that she has home oxygen set up and needs to use 2 liters oxygen while asleep.

## 2016-08-07 NOTE — Telephone Encounter (Signed)
Pt aware of ONO results. Pt states she currently has oxygen at home and she will continue to wear it at night at 2L. Pt voiced understanding & had no further questions. Nothing further needed.

## 2016-08-07 NOTE — Telephone Encounter (Signed)
Pt returning call.Diane [name] ° °

## 2016-08-13 ENCOUNTER — Ambulatory Visit (INDEPENDENT_AMBULATORY_CARE_PROVIDER_SITE_OTHER): Payer: Medicare Other | Admitting: Family Medicine

## 2016-08-13 ENCOUNTER — Encounter: Payer: Self-pay | Admitting: Family Medicine

## 2016-08-13 VITALS — BP 102/60 | HR 79 | Temp 96.6°F | Ht <= 58 in | Wt 145.2 lb

## 2016-08-13 DIAGNOSIS — Z794 Long term (current) use of insulin: Secondary | ICD-10-CM

## 2016-08-13 DIAGNOSIS — G40209 Localization-related (focal) (partial) symptomatic epilepsy and epileptic syndromes with complex partial seizures, not intractable, without status epilepticus: Secondary | ICD-10-CM

## 2016-08-13 DIAGNOSIS — I509 Heart failure, unspecified: Secondary | ICD-10-CM

## 2016-08-13 DIAGNOSIS — Z1159 Encounter for screening for other viral diseases: Secondary | ICD-10-CM | POA: Insufficient documentation

## 2016-08-13 DIAGNOSIS — J410 Simple chronic bronchitis: Secondary | ICD-10-CM | POA: Insufficient documentation

## 2016-08-13 DIAGNOSIS — Z8673 Personal history of transient ischemic attack (TIA), and cerebral infarction without residual deficits: Secondary | ICD-10-CM

## 2016-08-13 DIAGNOSIS — Z23 Encounter for immunization: Secondary | ICD-10-CM

## 2016-08-13 DIAGNOSIS — I779 Disorder of arteries and arterioles, unspecified: Secondary | ICD-10-CM | POA: Diagnosis not present

## 2016-08-13 DIAGNOSIS — F17200 Nicotine dependence, unspecified, uncomplicated: Secondary | ICD-10-CM

## 2016-08-13 DIAGNOSIS — E7800 Pure hypercholesterolemia, unspecified: Secondary | ICD-10-CM | POA: Diagnosis not present

## 2016-08-13 DIAGNOSIS — E1142 Type 2 diabetes mellitus with diabetic polyneuropathy: Secondary | ICD-10-CM

## 2016-08-13 DIAGNOSIS — I739 Peripheral vascular disease, unspecified: Secondary | ICD-10-CM

## 2016-08-13 DIAGNOSIS — R569 Unspecified convulsions: Secondary | ICD-10-CM

## 2016-08-13 DIAGNOSIS — I10 Essential (primary) hypertension: Secondary | ICD-10-CM | POA: Diagnosis not present

## 2016-08-13 DIAGNOSIS — I6350 Cerebral infarction due to unspecified occlusion or stenosis of unspecified cerebral artery: Secondary | ICD-10-CM | POA: Diagnosis not present

## 2016-08-13 DIAGNOSIS — K219 Gastro-esophageal reflux disease without esophagitis: Secondary | ICD-10-CM

## 2016-08-13 LAB — COMPREHENSIVE METABOLIC PANEL
ALT: 13 U/L (ref 0–35)
AST: 10 U/L (ref 0–37)
Albumin: 4.1 g/dL (ref 3.5–5.2)
Alkaline Phosphatase: 56 U/L (ref 39–117)
BUN: 9 mg/dL (ref 6–23)
CO2: 30 mEq/L (ref 19–32)
Calcium: 9.5 mg/dL (ref 8.4–10.5)
Chloride: 99 mEq/L (ref 96–112)
Creatinine, Ser: 0.67 mg/dL (ref 0.40–1.20)
GFR: 94.66 mL/min (ref >60.00)
Glucose, Bld: 202 mg/dL — ABNORMAL HIGH (ref 70–99)
Potassium: 5.1 mEq/L (ref 3.5–5.1)
Sodium: 136 mEq/L (ref 135–145)
Total Bilirubin: 0.3 mg/dL (ref 0.2–1.2)
Total Protein: 6.7 g/dL (ref 6.0–8.3)

## 2016-08-13 LAB — CBC WITH DIFFERENTIAL/PLATELET
Basophils Absolute: 0 10*3/uL (ref 0.0–0.1)
Basophils Relative: 0.5 % (ref 0.0–3.0)
Eosinophils Absolute: 0.1 10*3/uL (ref 0.0–0.7)
Eosinophils Relative: 1.9 % (ref 0.0–5.0)
HCT: 45.5 % (ref 36.0–46.0)
Hemoglobin: 15.8 g/dL — ABNORMAL HIGH (ref 12.0–15.0)
Lymphocytes Relative: 24.7 % (ref 12.0–46.0)
Lymphs Abs: 1.9 10*3/uL (ref 0.7–4.0)
MCHC: 34.7 g/dL (ref 30.0–36.0)
MCV: 88.9 fl (ref 78.0–100.0)
Monocytes Absolute: 0.6 10*3/uL (ref 0.1–1.0)
Monocytes Relative: 8.3 % (ref 3.0–12.0)
Neutro Abs: 4.9 10*3/uL (ref 1.4–7.7)
Neutrophils Relative %: 64.6 % (ref 43.0–77.0)
Platelets: 240 10*3/uL (ref 150.0–400.0)
RBC: 5.12 Mil/uL — ABNORMAL HIGH (ref 3.87–5.11)
RDW: 13.9 % (ref 11.5–15.5)
WBC: 7.5 10*3/uL (ref 4.0–10.5)

## 2016-08-13 LAB — LIPID PANEL
Cholesterol: 179 mg/dL (ref 0–200)
HDL: 65.2 mg/dL (ref >39.00)
LDL Cholesterol: 77 mg/dL (ref 0–99)
NonHDL: 114.05
Total CHOL/HDL Ratio: 3
Triglycerides: 183 mg/dL — ABNORMAL HIGH (ref 0.0–149.0)
VLDL: 36.6 mg/dL (ref 0.0–40.0)

## 2016-08-13 LAB — TSH: TSH: 1.35 u[IU]/mL (ref 0.35–4.50)

## 2016-08-13 MED ORDER — BENZONATATE 200 MG PO CAPS: 200.0000 mg | ORAL_CAPSULE | Freq: Three times a day (TID) | ORAL | 3 refills | Status: DC | PRN

## 2016-08-13 MED ORDER — DEXLANSOPRAZOLE 60 MG PO CPDR: DELAYED_RELEASE_CAPSULE | ORAL | 11 refills | Status: DC

## 2016-08-13 NOTE — Assessment & Plan Note (Signed)
Currently well controlled  Overall feels better on Entresto at a higher dose  Followed by Dr Martinique

## 2016-08-13 NOTE — Progress Notes (Signed)
Subjective:    Patient ID: Diane [name], female    DOB: 01/12/1956, 60 y.o.   MRN: [id number]  HPI Here for f/u of chronic medical problems   Also having flu shot today  Fall allergies -worse than usual  Coughing at night worse = helps to blow her nose  Smoking status - she tried to quit again sat (not successful)  Next quit date is thanksgiving- she will need people around her - will try some patches also  She has hypoxia at night - is on 02  No longer has sleep apnea per pt   Wt Readings from Last 3 Encounters:  08/13/16 145 lb 4 oz (65.9 kg)  07/11/16 142 lb (64.4 kg)  06/20/16 140 lb 12.8 oz (63.9 kg)  thinks she is eating about the same  Appetite is good (but gets nauseated in ams sometimes)- plans to f/u with her surgeon (feeling more abdominal pressure lately)  bmi of 30.3  Cannot walk a lot due to msk issues and sob and gen weakness in legs   bp is stable today  No cp or palpitations or headaches or edema  No side effects to medicines  BP Readings from Last 3 Encounters:  08/13/16 102/60  07/11/16 134/78  06/20/16 120/62     Sees endocrinology for her diabetes -Dr Chalmers Cater lab Doing pretty good per pt-last A1C was below 7 Cut her back to one metformin bid - but she had to inc back to 1000 mg   Also goiter-sees endo in Jan)  Lab Results  Component Value Date   TSH 1.81 06/09/2014     Sees neurology for seizure disorder  On dilantin and gabapentin and keppra (all 3)  No more seizures  On dexilant for GERD with dysphagia  Failed omeprazole/other ppi This is keeping it under control   Hx of hyperlipidemia with CAD and vascular dz incl carotid dz and hx of cva On plavix On crestor Now on entresto and doing much better-not needing nitroglycerin as often  Lab Results  Component Value Date   CHOL 130 06/21/2015   HDL 52.50 06/21/2015   LDLCALC 43 06/21/2015   LDLDIRECT 98.0 06/09/2013   TRIG 172.0 (H) 06/21/2015   CHOLHDL 2 06/21/2015  due for a check       Hx of anemia and low B12 in the past Lab Results  Component Value Date   WBC 8.7 01/31/2016   HGB 15.6 (H) 01/31/2016   HCT 46.7 (H) 01/31/2016   MCV 82.6 01/31/2016   PLT 201.0 01/31/2016    Lab Results  Component Value Date   VITAMINB12 491 01/31/2016   Last labs were re assuring  She no longer takes iron  Wants to re check (req from Dr Garen Grams)  Reubin Milan afford vit D or calcium   Wants to do hep C screen due to her age    Review of Systems Review of Systems  Constitutional: Negative for fever, appetite change, fatigue and unexpected weight change.  Eyes: Negative for pain and visual disturbance.  Respiratory: Negative for cough and shortness of breath.   Cardiovascular: Negative for cp or palpitations    Gastrointestinal: Negative for nausea, diarrhea and constipation.  Genitourinary: Negative for urgency and frequency.  Skin: Negative for pallor or rash   MSK more pain in hip and knee lately  Neurological: Negative for weakness, light-headedness, numbness and headaches.  Hematological: Negative for adenopathy. Does not bruise/bleed easily.  Psychiatric/Behavioral: Negative for dysphoric mood. The patient is  not nervous/anxious.         Objective:   Physical Exam  Constitutional: She appears well-developed and well-nourished. No distress.  obese and well appearing   HENT:  Head: Normocephalic and atraumatic.  Mouth/Throat: Oropharynx is clear and moist.  Nares are boggy Some pnd   Eyes: Conjunctivae and EOM are normal. Pupils are equal, round, and reactive to light.  Neck: Normal range of motion. Neck supple. No JVD present. Carotid bruit is present. No thyromegaly present.  Cardiovascular: Normal rate, regular rhythm, normal heart sounds and intact distal pulses.  Exam reveals no gallop.   Pulmonary/Chest: Effort normal and breath sounds normal. No respiratory distress. She has no wheezes. She has no rales.  Diffusely distant bs No crackles  No wheeze    Abdominal: Soft. Bowel sounds are normal. She exhibits no distension, no abdominal bruit and no mass. There is no tenderness.  Scars present from mass removal No M noted or tenderness  Musculoskeletal: She exhibits no edema or tenderness.  Lymphadenopathy:    She has no cervical adenopathy.  Neurological: She is alert. She has normal reflexes. No cranial nerve deficit. She exhibits normal muscle tone. Coordination normal.  Skin: Skin is warm and dry. No rash noted. No pallor.  Psychiatric: She has a normal mood and affect.          Assessment & Plan:   Problem List Items Addressed This Visit      Cardiovascular and Mediastinum   Carotid artery disease ([place])    On plavix and statin per cardiology      CHF (congestive heart failure) ([place])    Currently well controlled  Overall feels better on Entresto at a higher dose  Followed by Dr Martinique      Essential hypertension    bp in fair control at this time  BP Readings from Last 1 Encounters:  08/13/16 102/60   No changes needed Disc lifstyle change with low sodium diet and exercise  Labs drawn today        Relevant Orders   CBC with Differential/Platelet (Completed)   Comprehensive metabolic panel (Completed)   Lipid panel (Completed)   TSH (Completed)     Respiratory   Smokers' cough ([place])    Enc to quit smoking (also has copd)  Allergies may make this worse in season  Will try tessalon 200 mg tid prn  Update if worse or not improved         Digestive   GERD    Pt continues dexilant (in past omeprazole /ppis did not work well)  Important to control laryngeal symptoms and wheezing Dysphagia is improved  Disc diet for gerd       Relevant Medications   dexlansoprazole (DEXILANT) 60 MG capsule     Endocrine   Type 2 diabetes mellitus with diabetic polyneuropathy, with long-term current use of insulin ([place])    Pt continues to see Dr Sigurd Sos well         Nervous and Auditory   Complex partial  seizures ([place])     Other   History of CVA (cerebrovascular accident)    No further stroke activity  Continues to smoke  bp and cholesterol controlled  Sees neuro and cardiology On plavix      Hyperlipidemia    Disc goals for lipids and reasons to control them Rev labs with pt Rev low sat fat diet in detail  Due for lipid profile on crestor Pt states diet is good  Relevant Orders   Lipid panel (Completed)   Need for hepatitis C screening test    Requested screening today      Relevant Orders   Hepatitis C antibody   Seizures ([place])    Sees neuro and currently seizure free  On dilantin and gabapentin and keppra currently      TOBACCO USE    Disc in detail risks of smoking and possible outcomes including copd, vascular/ heart disease, cancer , respiratory and sinus infections  Pt voices understanding She continues to make effort to quit  Next quit date is thanksgiving-when she is with family , plans on trying patches again        Other Visit Diagnoses    Need for influenza vaccination    -  Primary   Relevant Orders   Flu Vaccine QUAD 36+ mos IM (Completed)

## 2016-08-13 NOTE — Assessment & Plan Note (Addendum)
Enc to quit smoking (also has copd)  Allergies may make this worse in season  Will try tessalon 200 mg tid prn  Update if worse or not improved

## 2016-08-13 NOTE — Assessment & Plan Note (Signed)
Requested screening today

## 2016-08-13 NOTE — Assessment & Plan Note (Signed)
No further stroke activity  Continues to smoke  bp and cholesterol controlled  Sees neuro and cardiology On plavix

## 2016-08-13 NOTE — Assessment & Plan Note (Signed)
Disc goals for lipids and reasons to control them Rev labs with pt Rev low sat fat diet in detail  Due for lipid profile on crestor Pt states diet is good

## 2016-08-13 NOTE — Assessment & Plan Note (Signed)
On plavix and statin per cardiology

## 2016-08-13 NOTE — Assessment & Plan Note (Signed)
Pt continues dexilant (in past omeprazole /ppis did not work well)  Important to control laryngeal symptoms and wheezing Dysphagia is improved  Disc diet for gerd

## 2016-08-13 NOTE — Patient Instructions (Addendum)
If you are interested in a shingles/zoster vaccine - call your insurance to check on coverage,( you should not get it within 1 month of other vaccines) , then call us for a prescription  for it to take to a pharmacy that gives the shot , or make a nurse visit to get it here depending on your coverage  Flu shot today Don't forget to get your eye exam  Keep thinking about quitting smoking   Labs today

## 2016-08-13 NOTE — Progress Notes (Signed)
Pre visit review using our clinic review tool, if applicable. No additional management support is needed unless otherwise documented below in the visit note. 

## 2016-08-13 NOTE — Assessment & Plan Note (Signed)
Pt continues to see Dr Sigurd Sos well

## 2016-08-13 NOTE — Assessment & Plan Note (Signed)
Disc in detail risks of smoking and possible outcomes including copd, vascular/ heart disease, cancer , respiratory and sinus infections  Pt voices understanding She continues to make effort to quit  Next quit date is thanksgiving-when she is with family , plans on trying patches again

## 2016-08-13 NOTE — Assessment & Plan Note (Signed)
Sees neuro and currently seizure free  On dilantin and gabapentin and keppra currently

## 2016-08-13 NOTE — Assessment & Plan Note (Signed)
bp in fair control at this time  BP Readings from Last 1 Encounters:  08/13/16 102/60   No changes needed Disc lifstyle change with low sodium diet and exercise  Labs drawn today

## 2016-08-14 LAB — HEPATITIS C ANTIBODY: HCV Ab: NEGATIVE

## 2016-08-14 NOTE — Progress Notes (Signed)
review 

## 2016-08-15 ENCOUNTER — Encounter: Payer: Self-pay | Admitting: *Deleted

## 2016-08-16 ENCOUNTER — Telehealth: Payer: Self-pay

## 2016-08-16 NOTE — Telephone Encounter (Signed)
Send this back to me please tomorrow so I can print a px (I am out of the office) I did look up her new med Diane [name] - it looks like there is a 9% incidence of cough as a side effect -she may want to d/w her cardiologist Did her cough begin after starting this medicine?

## 2016-08-16 NOTE — Telephone Encounter (Signed)
Nurse Jeanne Ivan from [facility] calls to notify Dr. Glori Bickers that patient's cough is severe and keeps her up at night.  She doesn't feel like she is getting any relief with Tessalon [place] and her insurance will not cover.   Per nurse, Pharmacy states that her insurance will not cover any cough medication.  Patient is requesting something be called in with Codiene in it.   Please Advise.  Can call patient or Nurse Tammy directly.

## 2016-08-17 ENCOUNTER — Telehealth: Payer: Self-pay | Admitting: Pharmacist

## 2016-08-17 MED ORDER — GUAIFENESIN-CODEINE 100-10 MG/5ML PO SYRP: 5.0000 mL | ORAL_SOLUTION | Freq: Four times a day (QID) | ORAL | 0 refills | Status: DC | PRN

## 2016-08-17 MED ORDER — SACUBITRIL-VALSARTAN 49-51 MG PO TABS: 1.0000 | ORAL_TABLET | Freq: Two times a day (BID) | ORAL | 1 refills | Status: DC

## 2016-08-17 MED ORDER — NITROGLYCERIN 0.4 MG/SPRAY TL SOLN: 1.0000 | 1 refills | Status: DC | PRN

## 2016-08-17 NOTE — Telephone Encounter (Signed)
See prev note.  Pt did say that her cough started after starting the Entresto, I advise pt to check with cardiology and advise them of her cough and pt verbalized understanding, pt request Rx to help with cough in the mean time

## 2016-08-17 NOTE — Telephone Encounter (Signed)
Returned call to pt as she has been having cough. Her primary care suggested it may be due to [facility]. Cough is reported at 9%.   She prefers to stay on the medication as she has been doing so well, but she would like to back down to the middle dose.   Discussed that if no improvement we will need to do trial off to determine if drug truly cause and she is comfortable with this plan.   She also asks about spray NTG as we still have not heard from insurance on this. Will follow up and let her know next week.   She states she understands and appreciates help.

## 2016-08-17 NOTE — Telephone Encounter (Signed)
Pt notified Rx ready for pick up and advise it can be sedating

## 2016-08-17 NOTE — Telephone Encounter (Signed)
Printed px for guifen with codeine for pick up  Warn of sedation  Thanks

## 2016-08-18 ENCOUNTER — Other Ambulatory Visit: Payer: Self-pay | Admitting: Cardiology

## 2016-08-20 ENCOUNTER — Other Ambulatory Visit: Payer: Self-pay | Admitting: Cardiology

## 2016-08-28 ENCOUNTER — Encounter: Payer: Self-pay | Admitting: Cardiology

## 2016-08-28 DIAGNOSIS — E119 Type 2 diabetes mellitus without complications: Secondary | ICD-10-CM | POA: Diagnosis not present

## 2016-08-30 DIAGNOSIS — E7800 Pure hypercholesterolemia, unspecified: Secondary | ICD-10-CM | POA: Diagnosis not present

## 2016-08-30 DIAGNOSIS — E041 Nontoxic single thyroid nodule: Secondary | ICD-10-CM | POA: Diagnosis not present

## 2016-08-30 DIAGNOSIS — I10 Essential (primary) hypertension: Secondary | ICD-10-CM | POA: Diagnosis not present

## 2016-08-30 DIAGNOSIS — E119 Type 2 diabetes mellitus without complications: Secondary | ICD-10-CM | POA: Diagnosis not present

## 2016-09-02 NOTE — Progress Notes (Signed)
Diane [name] Date of Birth: [birth year] Medical Record #[id number]  History of Present Illness: Diane [name] is seen for follow up CAD. She has a history of coronary disease and is status post CABG in March of 2007 by Dr. [name]. She presented later that year with recurrent angina. Repeat cardiac cath showed patent LIMA to the LAD but all other grafts occluded including SVG to OM, SVG to AC/PL, and radial graft to ramus intermediate. She then had complex stenting of the mid LCx and first OM with Taxus stents. The native RCA was occluded with collaterals.    Last year she was evaluated for abdominal surgery. Myoview abnormal. Cardiac cath as noted above but now stents in LCx and OM occluded. No suitable targets for PCI or surgery. Severe LV dysfunction with moderate pulmonary HTN and normal LV filling pressures. She has multiple cardiac risk factors including diabetes, dyslipidemia, hypertension, and tobacco abuse. She also has  bilateral carotid arterial disease. She continues to smoke.   In August 2016 she had removal of a large pelvic mass with BSO and sigmoid colectomy. Path c/w ovarian cystadenoma. No complications.  . She is followed by Dr. Gwenlyn Found for her carotid disease. Angiogram performed with findings of 70% bilateral stenoses. The LICA was heavily calcified. Both arteries had acute angulation after the stenosis making it less favorable for stenting. In March she developed dizziness, lightheadedness, and imbalance. Dr. Glori Bickers ordered an MRI that showed an old right MCA infarct and chronic lacunar infarcts that were new from 2014.   On her last visit with me we switched her losartan to [facility]. Since then she has complained of a persistent cough with clear phlegm. Sometimes she coughs so hard she vomits and heaves. She can't sleep. Entresto dose reduced without effect. She states cough is much worse than her usual allergy cough. Her anginal symptoms are stable. No weight gain or edema.   Current  Outpatient Prescriptions on File Prior to Visit  Medication Sig Dispense Refill  . aspirin EC 81 MG tablet Take 1 tablet (81 mg total) by mouth daily. 30 tablet 6  . carvedilol (COREG) 25 MG tablet TAKE 1 TABLET (25 MG TOTAL) BY MOUTH 2 (TWO) TIMES DAILY WITH A MEAL. 180 tablet 1  . clopidogrel (PLAVIX) 75 MG tablet TAKE 1 TABLET BY MOUTH EVERY DAY 90 tablet 0  . cyclobenzaprine (FLEXERIL) 10 MG tablet Take 10 mg by mouth at bedtime as needed.     [name] dexlansoprazole (DEXILANT) 60 MG capsule TAKE 1 CAPSULE (60 MG TOTAL) BY MOUTH DAILY. 30 capsule 11  . diphenhydrAMINE (BENADRYL) 25 mg capsule Take 2 capsules in AM and 1 capsule at night 270 capsule 3  . ferrous sulfate 325 (65 FE) MG tablet Take 325 mg by mouth daily with breakfast.    . fluticasone (FLONASE) 50 MCG/ACT nasal spray Place 2 sprays into both nostrils daily. 16 g 2  . fluticasone furoate-vilanterol (BREO ELLIPTA) 100-25 MCG/INH AEPB Inhale 1 puff into the lungs daily. 30 each 5  . furosemide (LASIX) 40 MG tablet TAKE 1 TABLET (40 MG TOTAL) BY MOUTH DAILY. 90 tablet 3  . gabapentin (NEURONTIN) 300 MG capsule Take 2 capsules (600 mg total) by mouth at bedtime. 60 capsule 11  . glimepiride (AMARYL) 2 MG tablet Take 2 mg by mouth 2 (two) times daily.     [name] guaiFENesin (MUCINEX) 600 MG 12 hr tablet Take 2 tablets (1,200 mg total) by mouth 2 (two) times daily. 360 tablet 3  .  guaiFENesin-codeine (ROBITUSSIN AC) 100-10 MG/5ML syrup Take 5 mLs by mouth 4 (four) times daily as needed for cough. With caution of sedation 120 mL 0  . hydrochlorothiazide (HYDRODIURIL) 50 MG tablet TAKE 1 TABLET BY MOUTH DAILY 30 tablet 6  . isosorbide mononitrate (IMDUR) 30 MG 24 hr tablet Take 1 tablet (30 mg total) by mouth daily. 180 tablet 2  . KLOR-CON M10 10 MEQ tablet TAKE 1 TABLET (10 MEQ TOTAL) BY MOUTH 2 (TWO) TIMES DAILY. 180 tablet 0  . levETIRAcetam (KEPPRA) 750 MG tablet TAKE 1 TABLET (750 MG TOTAL) BY MOUTH 2 (TWO) TIMES DAILY. 60 tablet 5  .  metFORMIN (GLUCOPHAGE-XR) 500 MG 24 hr tablet Take 1,000 mg by mouth 2 (two) times daily.  6  . nicotine (NICODERM CQ - DOSED IN MG/24 HOURS) 21 mg/24hr patch Place 21 mg onto the skin daily.    . nitroGLYCERIN (NITROLINGUAL) 0.4 MG/SPRAY spray Place 1 spray under the tongue every 5 (five) minutes x 3 doses as needed for chest pain. 12 g 1  . phenytoin (DILANTIN) 100 MG ER capsule Take 1 capsule (100 mg total) by mouth 2 (two) times daily. 60 capsule 11  . potassium chloride (K-DUR) 10 MEQ tablet Take 1 tablet (10 mEq total) by mouth 2 (two) times daily. 180 tablet 3  . ranolazine (RANEXA) 1000 MG SR tablet Take 1 tablet (1,000 mg total) by mouth 2 (two) times daily. 180 tablet 1  . rosuvastatin (CRESTOR) 10 MG tablet TAKE 1 TABLET BY MOUTH DAILY 90 tablet 3  . VENTOLIN HFA 108 (90 Base) MCG/ACT inhaler Inhale 1 puff into the lungs as needed.    . vitamin B-12 (CYANOCOBALAMIN) 1000 MCG tablet Take 1,000 mcg by mouth daily.     No current facility-administered medications on file prior to visit.     Allergies  Allergen Reactions  . Bee Venom Itching and Swelling  . Tetracycline     REACTION: reaction not known  . Ace Inhibitors Cough    Past Medical History:  Diagnosis Date  . Anemia   . Carotid stenosis   . CHF (congestive heart failure) ([place])   . Chronic bronchitis ([place])   . Coronary artery disease    post CABG in 3/07   . CVA (cerebral infarction) 1993  . Diabetes mellitus    type 2  . Dyslipidemia   . Fibromyalgia   . GERD (gastroesophageal reflux disease)   . Goiter   . Headache    hx of   . Hyperlipidemia   . Hypertension   . Myocardial infarction   . Pneumonia    hx of   . PONV (postoperative nausea and vomiting)    only once was patient sick in the 1980s   . Seizures ([place])    last seizure- 03/2013   . Shortness of breath dyspnea    with exertion or when fluid builds up   . Sleep apnea    used to wear a cpap- not used in 3 years   . Stroke ([place]) 1993  .  Systolic murmur    known mild AS and MR  . Tobacco abuse     Past Surgical History:  Procedure Laterality Date  . APPENDECTOMY    . CARDIAC CATHETERIZATION  11/29/05   EF of 55%  . CARDIAC CATHETERIZATION  08/06/06   EF of 45-50%  . CARDIAC CATHETERIZATION N/A 05/06/2015   Procedure: Right/Left Heart Cath and Coronary/Graft Angiography;  Surgeon: Florence Yeung M Martinique, MD;  Location: [facility]  INVASIVE CV LAB;  Service: Cardiovascular;  Laterality: N/A;  . CERVICAL FUSION  1990  . CHOLECYSTECTOMY    . CORONARY ARTERY BYPASS GRAFT  12/04/2005   x5 -- left internal mammary artery to the LAD, left radial artery to the ramus intermedius, saphenous vein graft to the obtuse marginal 1, sequential saphenous vein grat to the acute marginal and posterior descending, endoscopic vein harvesting from the left thigh with open vein harvest from right leg  . CORONARY STENT PLACEMENT  08/11/06   PCI of her ciurcumflex/OM vessel  . LAPAROTOMY Bilateral 05/19/2015   Procedure: EXPLORATORY LAPAROTOMY WITH BILATERAL SALPINGO OOPHORECTOMY /OMENTECTOMY/SEGMENTAL SIGMOID COLECTOMY ;  Surgeon: Everitt Amber, MD;  Location: [facility] ORS;  Service: Gynecology;  Laterality: Bilateral;  . PERIPHERAL VASCULAR CATHETERIZATION N/A 11/15/2015   Procedure: Carotid PTA/Stent Intervention;  Surgeon: Lorretta Harp, MD;  Location: [place] CV LAB;  Service: Cardiovascular;  Laterality: N/A;  . PERIPHERAL VASCULAR CATHETERIZATION  11/15/2015   Procedure: Carotid Angiography;  Surgeon: Lorretta Harp, MD;  Location: [place] CV LAB;  Service: Cardiovascular;;    History  Smoking Status  . Current Every Day Smoker  . Packs/day: 1.50  . Years: 49.00  . Types: Cigarettes  Smokeless Tobacco  . Never Used    Comment: 1.5 pack to 2 packs per day    History  Alcohol Use No    Family History  Problem Relation Age of Onset  . Heart disease Mother   . Diabetes Mother   . COPD Mother   . Hyperlipidemia Mother   . Hypertension Mother   .  Cancer Father   . Drug abuse Paternal Grandmother   . Stroke Paternal Grandfather   . Coronary artery disease    . Heart disease    . Cancer Paternal Aunt     lung with mets to brain  . Cancer Paternal Uncle     skin cancer  . Cancer Paternal Uncle     lung/liver to brain  . Cancer Paternal Uncle     cancer of unknown type    Review of Systems: As noted in history of present illness.  All other systems were reviewed and are negative.  Physical Exam: BP 110/62 (BP Location: Right Arm, Patient Position: Sitting, Cuff Size: Normal)   Pulse 78   Ht '4\' 10"'$  (1.473 m)   Wt 144 lb (65.3 kg)   LMP 09/02/2003   SpO2 91%   BMI 30.10 kg/m  She is a chronically ill-appearing white female in no acute distress. She is normocephalic, atraumatic. Pupils are equal round and reactive. Sclera clear. Oropharynx is clear. Neck reveals bilateral carotid bruits. There is no JVD. Lungs are clear. Cardiac exam reveals a grade 4-6/9 systolic murmur at the right upper sternal border. Abdomen soft and NT. No masses. She has no edema. Pedal pulses are good. Neuro alert and oriented x 3. No focal findings.  LABORATORY DATA: Lab Results  Component Value Date   WBC 7.5 08/13/2016   HGB 15.8 (H) 08/13/2016   HCT 45.5 08/13/2016   PLT 240.0 08/13/2016   GLUCOSE 202 (H) 08/13/2016   CHOL 179 08/13/2016   TRIG 183.0 (H) 08/13/2016   HDL 65.20 08/13/2016   LDLDIRECT 98.0 06/09/2013   LDLCALC 77 08/13/2016   ALT 13 08/13/2016   AST 10 08/13/2016   NA 136 08/13/2016   K 5.1 08/13/2016   CL 99 08/13/2016   CREATININE 0.67 08/13/2016   BUN 9 08/13/2016   CO2 30 08/13/2016  TSH 1.35 08/13/2016   INR 1.05 11/10/2015   HGBA1C 8.0 (H) 06/09/2014   MICROALBUR 1.9 06/09/2013   Ecg today shows NSR with poor R wave progression. Old inferior infarct. No acute change. I have personally reviewed and interpreted this study.  Assessment / Plan: 1. CAD S/p CABG. Failed bypass grafts except for LIMA to LAD. S/p  remote stenting of LCX and OM with Taxus DES now occluded. Stable class 2 angina. Continue aggressive antianginal Rx. With ASA, plavix, nitrates, coreg, and Ranexa. Stressed importance of smoking cessation.  2. Carotid arterial disese- moderate, 70% bilateral. Not favorable for stenting. She is a poor surgical candidate. Carotid dopplers in September 2017 unchanged. Followed by Dr Gwenlyn Found. 3. Tobacco abuse. Recommend complete cessation. Patient states she is going to keep trying to quit. 4. HTN controlled. 5. Chronic combined systolic and diastolic CHF. Severe LV dysfunction. By cath EDP was normal.  Continue current medication. Sodium restriction.  6. Pulmonary HTN. 7. S/p major abdominal surgery for cystadenoma. No complications. She is going to follow up with GYN concerning her symptoms.  8. Lacunar infarcts. Explained that this is more likely related to chronic HTN and not her carotid disease. 9. Cough. Possibly related to [facility]. Will stop Entresto and resume losartan 100 mg daily. She is seeing Dr. Halford Chessman today. Would get CXR there since no Xray in last year.  I will follow up in 3 months.

## 2016-09-04 ENCOUNTER — Ambulatory Visit (INDEPENDENT_AMBULATORY_CARE_PROVIDER_SITE_OTHER): Payer: Medicare Other | Admitting: Pulmonary Disease

## 2016-09-04 ENCOUNTER — Encounter: Payer: Self-pay | Admitting: Pulmonary Disease

## 2016-09-04 ENCOUNTER — Encounter: Payer: Self-pay | Admitting: Cardiology

## 2016-09-04 ENCOUNTER — Ambulatory Visit (INDEPENDENT_AMBULATORY_CARE_PROVIDER_SITE_OTHER)
Admission: RE | Admit: 2016-09-04 | Discharge: 2016-09-04 | Disposition: A | Discharge status: Home / Self Care | Payer: Medicare Other | Source: Ambulatory Visit | Attending: Pulmonary Disease | Admitting: Pulmonary Disease

## 2016-09-04 ENCOUNTER — Ambulatory Visit (INDEPENDENT_AMBULATORY_CARE_PROVIDER_SITE_OTHER): Payer: Medicare Other | Admitting: Cardiology

## 2016-09-04 VITALS — BP 110/62 | HR 78 | Ht <= 58 in | Wt 144.0 lb

## 2016-09-04 VITALS — BP 112/62 | HR 77 | Ht <= 58 in | Wt 145.0 lb

## 2016-09-04 DIAGNOSIS — I209 Angina pectoris, unspecified: Secondary | ICD-10-CM

## 2016-09-04 DIAGNOSIS — I5042 Chronic combined systolic (congestive) and diastolic (congestive) heart failure: Secondary | ICD-10-CM

## 2016-09-04 DIAGNOSIS — I10 Essential (primary) hypertension: Secondary | ICD-10-CM

## 2016-09-04 DIAGNOSIS — Z720 Tobacco use: Secondary | ICD-10-CM | POA: Diagnosis not present

## 2016-09-04 DIAGNOSIS — E7800 Pure hypercholesterolemia, unspecified: Secondary | ICD-10-CM | POA: Diagnosis not present

## 2016-09-04 DIAGNOSIS — R0602 Shortness of breath: Secondary | ICD-10-CM | POA: Diagnosis not present

## 2016-09-04 DIAGNOSIS — I739 Peripheral vascular disease, unspecified: Principal | ICD-10-CM

## 2016-09-04 DIAGNOSIS — I6350 Cerebral infarction due to unspecified occlusion or stenosis of unspecified cerebral artery: Secondary | ICD-10-CM | POA: Diagnosis not present

## 2016-09-04 DIAGNOSIS — I779 Disorder of arteries and arterioles, unspecified: Secondary | ICD-10-CM | POA: Diagnosis not present

## 2016-09-04 DIAGNOSIS — J411 Mucopurulent chronic bronchitis: Secondary | ICD-10-CM

## 2016-09-04 DIAGNOSIS — R053 Chronic cough: Secondary | ICD-10-CM

## 2016-09-04 DIAGNOSIS — R05 Cough: Secondary | ICD-10-CM

## 2016-09-04 DIAGNOSIS — I25708 Atherosclerosis of coronary artery bypass graft(s), unspecified, with other forms of angina pectoris: Secondary | ICD-10-CM

## 2016-09-04 DIAGNOSIS — J9611 Chronic respiratory failure with hypoxia: Secondary | ICD-10-CM | POA: Diagnosis not present

## 2016-09-04 MED ORDER — LOSARTAN POTASSIUM 100 MG PO TABS: 100.0000 mg | ORAL_TABLET | Freq: Every day | ORAL | 3 refills | Status: DC

## 2016-09-04 NOTE — Patient Instructions (Addendum)
Stop Entresto.  Resume losartan 100 mg daily  Follow up with Dr. Halford Chessman with CXR today  I will see you in 3 months

## 2016-09-04 NOTE — Patient Instructions (Addendum)
Bevespi two puffs twice per day - call once sample finished if you feel like bevespi is helping and you want a refill  Chest xray today  Follow up in 6 months

## 2016-09-04 NOTE — Progress Notes (Signed)
Current Outpatient Prescriptions on File Prior to Visit  Medication Sig  . aspirin EC 81 MG tablet Take 1 tablet (81 mg total) by mouth daily.  . carvedilol (COREG) 25 MG tablet TAKE 1 TABLET (25 MG TOTAL) BY MOUTH 2 (TWO) TIMES DAILY WITH A MEAL.  [name] clopidogrel (PLAVIX) 75 MG tablet TAKE 1 TABLET BY MOUTH EVERY DAY  . cyclobenzaprine (FLEXERIL) 10 MG tablet Take 10 mg by mouth at bedtime as needed.   [name] dexlansoprazole (DEXILANT) 60 MG capsule TAKE 1 CAPSULE (60 MG TOTAL) BY MOUTH DAILY.  . diphenhydrAMINE (BENADRYL) 25 mg capsule Take 2 capsules in AM and 1 capsule at night  . ferrous sulfate 325 (65 FE) MG tablet Take 325 mg by mouth daily with breakfast.  . fluticasone (FLONASE) 50 MCG/ACT nasal spray Place 2 sprays into both nostrils daily.  . furosemide (LASIX) 40 MG tablet TAKE 1 TABLET (40 MG TOTAL) BY MOUTH DAILY.  [name] gabapentin (NEURONTIN) 300 MG capsule Take 2 capsules (600 mg total) by mouth at bedtime.  [name] glimepiride (AMARYL) 2 MG tablet Take 2 mg by mouth 2 (two) times daily.   [name] guaiFENesin (MUCINEX) 600 MG 12 hr tablet Take 2 tablets (1,200 mg total) by mouth 2 (two) times daily.  [name] guaiFENesin-codeine (ROBITUSSIN AC) 100-10 MG/5ML syrup Take 5 mLs by mouth 4 (four) times daily as needed for cough. With caution of sedation  . hydrochlorothiazide (HYDRODIURIL) 50 MG tablet TAKE 1 TABLET BY MOUTH DAILY  . isosorbide mononitrate (IMDUR) 30 MG 24 hr tablet Take 1 tablet (30 mg total) by mouth daily.  [name] KLOR-CON M10 10 MEQ tablet TAKE 1 TABLET (10 MEQ TOTAL) BY MOUTH 2 (TWO) TIMES DAILY.  [name] levETIRAcetam (KEPPRA) 750 MG tablet TAKE 1 TABLET (750 MG TOTAL) BY MOUTH 2 (TWO) TIMES DAILY.  [name] losartan (COZAAR) 100 MG tablet Take 1 tablet (100 mg total) by mouth daily.  . metFORMIN (GLUCOPHAGE-XR) 500 MG 24 hr tablet Take 1,000 mg by mouth 2 (two) times daily.  . nicotine (NICODERM CQ - DOSED IN MG/24 HOURS) 21 mg/24hr patch Place 21 mg onto the skin daily.  . phenytoin (DILANTIN) 100 MG ER  capsule Take 1 capsule (100 mg total) by mouth 2 (two) times daily.  . potassium chloride (K-DUR) 10 MEQ tablet Take 1 tablet (10 mEq total) by mouth 2 (two) times daily.  . ranolazine (RANEXA) 1000 MG SR tablet Take 1 tablet (1,000 mg total) by mouth 2 (two) times daily.  . rosuvastatin (CRESTOR) 10 MG tablet TAKE 1 TABLET BY MOUTH DAILY  . VENTOLIN HFA 108 (90 Base) MCG/ACT inhaler Inhale 1 puff into the lungs as needed.  . vitamin B-12 (CYANOCOBALAMIN) 1000 MCG tablet Take 1,000 mcg by mouth daily.  . fluticasone furoate-vilanterol (BREO ELLIPTA) 100-25 MCG/INH AEPB Inhale 1 puff into the lungs daily. (Patient not taking: Reported on 09/04/2016)  . nitroGLYCERIN (NITROLINGUAL) 0.4 MG/SPRAY spray Place 1 spray under the tongue every 5 (five) minutes x 3 doses as needed for chest pain. (Patient not taking: Reported on 09/04/2016)   No current facility-administered medications on file prior to visit.      Chief Complaint  Patient presents with  . Follow-up    Pt still using 2 Liters O2 at night.  [facility] is replacing her current system d/t malfunction. Pt c/o cough spells that are severe and she ends up vomiting. Cannot sleep d/t cough. Pt was told by Cardiologist that her [name] could be what caused the cough. Dr Martinique  requests cxr be done today.      Sleep tests PSG 04/10/06>> RDI 105  CPAP 06/13/11 to 07/10/11>>Used on 20 of 28 nights with median 3 hrs 45 min.  Average AHI 5 with median CPAP 7 cm H2O and 95th percentile CPAP 10 cm H2O.  Pulmonary tests PFT 05/12/12>>FEV1 1.83 (98%), FEV1% 80, TLC 3.83 (98%), DLCO 61% (corrects for lung volumes), no BD response PFT 06/30/14 >> FEV1 1.80 (93%), FEV1% 86, TLC 4.35 (108%), DLCO 82%, no BD Spirometry 05/11/16 >> FEV1 1.74 (89%), FEV1% 76  ONO with RA 07/27/16 >> test time 6 hrs 25 min.  Mean SpO2 91.04%, low SpO2 68%.  Spent 18 min with SpO2 < 88%.  Cardiac tests Echo 04/25/15 >> EF 70%, grade 2 diastolic CHF, mod MR, PAS 45 mmHg  Past  medical history Combined CHF, CAD s/p CABG, CVA, DM, HLD, HTN, Fibromyalgia, GERD, Goiter, Pneumonia, Seizures  Past surgical history, Family history, Social history, Allergies reviewed  Vital Signs BP 112/62 (BP Location: Left Arm, Cuff Size: Normal)   Pulse 77   Ht '4\' 10"'$  (1.473 m)   Wt 145 lb (65.8 kg)   LMP 09/02/2003   SpO2 98%   BMI 30.31 kg/m   History of Present Illness Diane [name] is a 60 y.o. female smoker with chronic bronchitis, OSA.  She has noticed more cough over the past few months.  This coincides with starting of entresto.  She was seen by cardiology earlier today, and this was changed to an ARB.  She usually has dry cough, but sometimes brings up clear sputum.  She is not having wheeze.  Her chest would get sore when she coughed.  She denies fever, or hemoptysis.  She is using oxygen at night.  She was upset that she could visit family over Thanksgiving because she couldn't bring her oxygen set up with her.   She is still smoking, but plans to quit as New Years resolution.  She maintain SpO2 > 94% during ambulatory oximetry today.  Physical Exam  General - No distress ENT - No sinus tenderness, no oral exudate, no LAN Cardiac - s1s2 regular, no murmur Chest - No wheeze/rales/dullness Back - No focal tenderness Abd - Soft, non-tender Ext - No edema Neuro - Normal strength Skin - No rashes Psych - normal mood, and behavior   Assessment/Plan  Cough. - concern this could have been related to ACE inhibitor use > entresto change to ARB by cardiology - will get chest xray  Chronic bronchitis. - she got thrush from breo, and didn't feel spiriva helped - will try her on bevespi - continue prn ventolin  Chronic respiratory failure with hypoxia. - continue 2 liters oxygen at night - advised her to d/w her DME about options for arranging for oxygen set up when she travels  Tobacco abuse. - she has set 10/01/16 as her quit date   Patient  Instructions  Bevespi two puffs twice per day - call once sample finished if you feel like bevespi is helping and you want a refill  Chest xray today  Follow up in 6 months    [name], MD [facility] Pager:  [phone] 09/04/2016, 2:34 PM

## 2016-09-05 DIAGNOSIS — Z7984 Long term (current) use of oral hypoglycemic drugs: Secondary | ICD-10-CM | POA: Diagnosis not present

## 2016-09-05 DIAGNOSIS — H40033 Anatomical narrow angle, bilateral: Secondary | ICD-10-CM | POA: Diagnosis not present

## 2016-09-05 DIAGNOSIS — H5203 Hypermetropia, bilateral: Secondary | ICD-10-CM | POA: Diagnosis not present

## 2016-09-05 DIAGNOSIS — H52223 Regular astigmatism, bilateral: Secondary | ICD-10-CM | POA: Diagnosis not present

## 2016-09-05 DIAGNOSIS — H2513 Age-related nuclear cataract, bilateral: Secondary | ICD-10-CM | POA: Diagnosis not present

## 2016-09-05 DIAGNOSIS — H11153 Pinguecula, bilateral: Secondary | ICD-10-CM | POA: Diagnosis not present

## 2016-09-05 DIAGNOSIS — H4322 Crystalline deposits in vitreous body, left eye: Secondary | ICD-10-CM | POA: Diagnosis not present

## 2016-09-05 DIAGNOSIS — E119 Type 2 diabetes mellitus without complications: Secondary | ICD-10-CM | POA: Diagnosis not present

## 2016-09-06 ENCOUNTER — Telehealth: Payer: Self-pay | Admitting: Pulmonary Disease

## 2016-09-06 NOTE — Telephone Encounter (Signed)
Dg Chest 2 View  Result Date: 09/04/2016 CLINICAL DATA:  Chronic cough, congestion, and shortness of breath for 2-3 months. History of COPD. EXAM: CHEST  2 VIEW COMPARISON:  18 16 FINDINGS: Endotracheal tube, enteric tube, and right jugular sheath have been removed. Sequelae of prior CABG are again identified. The cardiac silhouette is upper limits of normal in size. The lungs are well inflated without evidence of airspace consolidation, edema, pleural effusion, or pneumothorax. Mildly increased density in the left lung base partially silhouetting the left heart border corresponds to prominent mediastinal fat on a prior abdominal CT. Postoperative changes in the cervical spine. IMPRESSION: No active cardiopulmonary disease. Electronically Signed   By: Logan Bores M.D.   On: 09/04/2016 15:10     Will have my nurse inform CXR shows expected changes with history of bypass surgery.  No other significant findings.

## 2016-09-07 NOTE — Telephone Encounter (Signed)
LM x 1 

## 2016-09-07 NOTE — Telephone Encounter (Signed)
Pt aware of results and voiced understanding. Nothing further needed.

## 2016-09-07 NOTE — Telephone Encounter (Signed)
Pt returning call for results.Diane [name]

## 2016-09-12 ENCOUNTER — Encounter: Payer: Self-pay | Admitting: *Deleted

## 2016-09-12 ENCOUNTER — Ambulatory Visit: Payer: Medicare Other | Admitting: Cardiovascular Disease

## 2016-09-13 ENCOUNTER — Other Ambulatory Visit: Payer: Self-pay | Admitting: Neurology

## 2016-09-14 NOTE — Telephone Encounter (Signed)
Continue gabapentin '600mg'$  at bedtime

## 2016-10-15 ENCOUNTER — Ambulatory Visit (INDEPENDENT_AMBULATORY_CARE_PROVIDER_SITE_OTHER): Payer: Medicare Other | Admitting: Rheumatology

## 2016-10-15 ENCOUNTER — Encounter: Payer: Self-pay | Admitting: Rheumatology

## 2016-10-15 VITALS — BP 121/68 | HR 75 | Resp 15 | Ht <= 58 in | Wt 148.0 lb

## 2016-10-15 DIAGNOSIS — M797 Fibromyalgia: Secondary | ICD-10-CM | POA: Diagnosis not present

## 2016-10-15 DIAGNOSIS — Z716 Tobacco abuse counseling: Secondary | ICD-10-CM

## 2016-10-15 DIAGNOSIS — Z720 Tobacco use: Secondary | ICD-10-CM

## 2016-10-15 DIAGNOSIS — F5101 Primary insomnia: Secondary | ICD-10-CM

## 2016-10-15 DIAGNOSIS — R5382 Chronic fatigue, unspecified: Secondary | ICD-10-CM

## 2016-10-15 MED ORDER — CYCLOBENZAPRINE HCL 10 MG PO TABS: 10.0000 mg | ORAL_TABLET | Freq: Every day | ORAL | 1 refills | Status: DC

## 2016-10-15 NOTE — Progress Notes (Signed)
Office Visit Note  Patient: Diane [name]             Date of Birth: 04/29/1956           MRN: [id number]             PCP: Loura Pardon, MD Referring: Tower, [name], MD Visit Date: 10/15/2016 Occupation: '@GUAROCC'$ @    Subjective:  Follow-up Follow-up on fibromyalgia.  History of Present Illness: Diane [name] is a 61 y.o. female  Last seen 02/08/2016  Patient is hurting all over. Her fibromyalgia is about the same as last visit.  She uses cyclobenzaprine 10 mg daily at bedtime when necessary.  Patient is actually getting a nurse visit every week or 2. It was set up by her PCP according to the patient. Patient states that she is under hospice care but she was unclear why she is under hospice care.  Reviewing her record shows that she sees a cardiologist, pulmonologist.  Activities of Daily Living:  Patient reports morning stiffness for 30 minutes.   Patient Reports nocturnal pain.  Difficulty dressing/grooming: Reports Difficulty climbing stairs: Reports Difficulty getting out of chair: Reports Difficulty using hands for taps, buttons, cutlery, and/or writing: Reports   No Rheumatology ROS completed.   PMFS History:  Patient Active Problem List   Diagnosis Date Noted  . Need for hepatitis C screening test 08/13/2016  . Smokers' cough ([place]) 08/13/2016  . Chronic respiratory failure ([place]) 07/11/2016  . COPD with chronic bronchitis ([place]) 05/20/2016  . Sleep-related hypoventilation due to lower airway obstruction 05/20/2016  . Periodic limb movements of sleep 05/20/2016  . CHF (congestive heart failure) ([place]) 05/08/2016  . Type 2 diabetes mellitus with diabetic polyneuropathy, with long-term current use of insulin ([place]) 12/13/2015  . Anemia, iron deficiency 12/01/2015  . B12 deficiency 12/01/2015  . History of CVA (cerebrovascular accident) 11/29/2015  . Anemia 11/29/2015  . Abnormal nuclear stress test 05/06/2015  . Localization-related idiopathic epilepsy and  epileptic syndromes with seizures of localized onset, not intractable, without status epilepticus ([place]) 03/22/2015  . Cervical disc disorder with radiculopathy of cervical region 03/22/2015  . Complex partial seizures ([place]) 09/16/2013  . Encounter for Medicare annual wellness exam 06/16/2013  . Routine general medical examination at a health care facility 06/08/2013  . Seizures ([place]) 03/25/2013  . Goiter 09/01/2012  . Other screening mammogram 06/13/2012  . Allergic rhinitis 06/13/2012  . Hyponatremia 12/11/2011  . Carotid artery disease ([place]) 08/20/2011  . Other dysphagia 09/14/2010  . DIZZINESS 07/26/2010  . Obstructive sleep apnea 09/18/2007  . TOBACCO USE 08/08/2007  . DIABETES MELLITUS, TYPE II 03/26/2007  . Hyperlipidemia 03/26/2007  . ANXIETY 03/26/2007  . DEPRESSION 03/26/2007  . MIGRAINE HEADACHE 03/26/2007  . CARPAL TUNNEL SYNDROME, BILATERAL 03/26/2007  . Essential hypertension 03/26/2007  . Coronary atherosclerosis 03/26/2007  . Cerebral artery occlusion with cerebral infarction ([place]) 03/26/2007  . GERD 03/26/2007  . FIBROMYALGIA 03/26/2007    Past Medical History:  Diagnosis Date  . Anemia   . Carotid stenosis   . CHF (congestive heart failure) ([place])   . Chronic bronchitis ([place])   . Coronary artery disease    post CABG in 3/07   . CVA (cerebral infarction) 1993  . Diabetes mellitus    type 2  . Dyslipidemia   . Fibromyalgia   . GERD (gastroesophageal reflux disease)   . Goiter   . Headache    hx of   . Hyperlipidemia   . Hypertension   .  Myocardial infarction   . Pneumonia    hx of   . PONV (postoperative nausea and vomiting)    only once was patient sick in the 1980s   . Seizures ([place])    last seizure- 03/2013   . Shortness of breath dyspnea    with exertion or when fluid builds up   . Sleep apnea    used to wear a cpap- not used in 3 years   . Stroke ([place]) 1993  . Systolic murmur    known mild AS and MR  . Tobacco abuse     Family History    Problem Relation Age of Onset  . Heart disease Mother   . Diabetes Mother   . COPD Mother   . Hyperlipidemia Mother   . Hypertension Mother   . Cancer Father   . Drug abuse Paternal Grandmother   . Stroke Paternal Grandfather   . Coronary artery disease    . Heart disease    . Cancer Paternal Aunt     lung with mets to brain  . Cancer Paternal Uncle     skin cancer  . Cancer Paternal Uncle     lung/liver to brain  . Cancer Paternal Uncle     cancer of unknown type  . Hypertension Sister   . Cancer Sister   . Glaucoma Sister    Past Surgical History:  Procedure Laterality Date  . APPENDECTOMY    . CARDIAC CATHETERIZATION  11/29/05   EF of 55%  . CARDIAC CATHETERIZATION  08/06/06   EF of 45-50%  . CARDIAC CATHETERIZATION N/A 05/06/2015   Procedure: Right/Left Heart Cath and Coronary/Graft Angiography;  Surgeon: Peter M Martinique, MD;  Location: [facility];  Service: Cardiovascular;  Laterality: N/A;  . CERVICAL FUSION  1990  . CHOLECYSTECTOMY    . CORONARY ARTERY BYPASS GRAFT  12/04/2005   x5 -- left internal mammary artery to the LAD, left radial artery to the ramus intermedius, saphenous vein graft to the obtuse marginal 1, sequential saphenous vein grat to the acute marginal and posterior descending, endoscopic vein harvesting from the left thigh with open vein harvest from right leg  . CORONARY STENT PLACEMENT  08/11/06   PCI of her ciurcumflex/OM vessel  . LAPAROTOMY Bilateral 05/19/2015   Procedure: EXPLORATORY LAPAROTOMY WITH BILATERAL SALPINGO OOPHORECTOMY /OMENTECTOMY/SEGMENTAL SIGMOID COLECTOMY ;  Surgeon: Everitt Amber, MD;  Location: [facility];  Service: Gynecology;  Laterality: Bilateral;  . PERIPHERAL VASCULAR CATHETERIZATION N/A 11/15/2015   Procedure: Carotid PTA/Stent Intervention;  Surgeon: Lorretta Harp, MD;  Location: [facility];  Service: Cardiovascular;  Laterality: N/A;  . PERIPHERAL VASCULAR CATHETERIZATION  11/15/2015   Procedure: Carotid  Angiography;  Surgeon: Lorretta Harp, MD;  Location: [facility];  Service: Cardiovascular;;   Social History   Social History Narrative  . No narrative on file     Objective: Vital Signs: BP 121/68 (BP Location: Left Arm, Patient Position: Sitting, Cuff Size: Normal)   Pulse 75   Resp 15   Ht '4\' 10"'$  (1.473 m)   Wt 148 lb (67.1 kg)   LMP 09/02/2003   BMI 30.93 kg/m    Physical Exam   Musculoskeletal Exam:  Full range of motion of all joints Grip strength is equal and strong bilaterally Fibromyalgia tender points are all absent  CDAI Exam: No CDAI exam completed.  No synovitis on examination  Investigation: No additional findings. Office Visit on 08/13/2016  Component Date Value Ref  Range Status  . WBC 08/13/2016 7.5  4.0 - 10.5 K/uL Final  . RBC 08/13/2016 5.12* 3.87 - 5.11 Mil/uL Final  . Hemoglobin 08/13/2016 15.8* 12.0 - 15.0 g/dL Final  . HCT 08/13/2016 45.5  36.0 - 46.0 % Final  . MCV 08/13/2016 88.9  78.0 - 100.0 fl Final  . MCHC 08/13/2016 34.7  30.0 - 36.0 g/dL Final  . RDW 08/13/2016 13.9  11.5 - 15.5 % Final  . Platelets 08/13/2016 240.0  150.0 - 400.0 K/uL Final  . Neutrophils Relative % 08/13/2016 64.6  43.0 - 77.0 % Final  . Lymphocytes Relative 08/13/2016 24.7  12.0 - 46.0 % Final  . Monocytes Relative 08/13/2016 8.3  3.0 - 12.0 % Final  . Eosinophils Relative 08/13/2016 1.9  0.0 - 5.0 % Final  . Basophils Relative 08/13/2016 0.5  0.0 - 3.0 % Final  . Neutro Abs 08/13/2016 4.9  1.4 - 7.7 K/uL Final  . Lymphs Abs 08/13/2016 1.9  0.7 - 4.0 K/uL Final  . Monocytes Absolute 08/13/2016 0.6  0.1 - 1.0 K/uL Final  . Eosinophils Absolute 08/13/2016 0.1  0.0 - 0.7 K/uL Final  . Basophils Absolute 08/13/2016 0.0  0.0 - 0.1 K/uL Final  . Sodium 08/13/2016 136  135 - 145 mEq/L Final  . Potassium 08/13/2016 5.1  3.5 - 5.1 mEq/L Final  . Chloride 08/13/2016 99  96 - 112 mEq/L Final  . CO2 08/13/2016 30  19 - 32 mEq/L Final  . Glucose, Bld 08/13/2016  202* 70 - 99 mg/dL Final  . BUN 08/13/2016 9  6 - 23 mg/dL Final  . Creatinine, Ser 08/13/2016 0.67  0.40 - 1.20 mg/dL Final  . Total Bilirubin 08/13/2016 0.3  0.2 - 1.2 mg/dL Final  . Alkaline Phosphatase 08/13/2016 56  39 - 117 U/L Final  . AST 08/13/2016 10  0 - 37 U/L Final  . ALT 08/13/2016 13  0 - 35 U/L Final  . Total Protein 08/13/2016 6.7  6.0 - 8.3 g/dL Final  . Albumin 08/13/2016 4.1  3.5 - 5.2 g/dL Final  . Calcium 08/13/2016 9.5  8.4 - 10.5 mg/dL Final  . GFR 08/13/2016 94.66  >60.00 mL/min Final  . Cholesterol 08/13/2016 179  0 - 200 mg/dL Final  . Triglycerides 08/13/2016 183.0* 0.0 - 149.0 mg/dL Final  . HDL 08/13/2016 65.20  >39.00 mg/dL Final  . VLDL 08/13/2016 36.6  0.0 - 40.0 mg/dL Final  . LDL Cholesterol 08/13/2016 77  0 - 99 mg/dL Final  . Total CHOL/HDL Ratio 08/13/2016 3   Final  . NonHDL 08/13/2016 114.05   Final  . HCV Ab 08/14/2016 NEGATIVE  NEGATIVE Final  . TSH 08/13/2016 1.35  0.35 - 4.50 uIU/mL Final     Imaging: No results found.  Speciality Comments: No specialty comments available.    Procedures:  No procedures performed Allergies: Bee venom; Tetracycline; and Ace inhibitors   Assessment / Plan:     Visit Diagnoses: Fibromyalgia  Chronic fatigue  Primary insomnia  Encounter for smoking cessation counseling   Plan: #1: Continue cyclobenzaprine for insomnia. Half to one pill daily at bedtime when necessary dispense 90 day supply with 1 refill   #2: Patient continues to have fibromyalgia discomfort. No change from the last visit.  #3: Ongoing fatigue and insomnia.  #4: We discussed smoking cessation. Currently she is smoking 1-1/2 pack per day. This is equal to 30 cigarettes. Every Sunday she should decrease by one cigarette daily. This means this coming  Sunday she'll do 29 the following Sunday she'll do 28 every day the following Sunday she'll do 27 every day etc. Patient is very agreeable to this tapered cigarette schedule and is  eager to try it. She does have nicotine patches but she knows to not use the patches when she is smoking.  #5: Return to clinic in 6 months  #6: If ago she hurts all over and she has trapezius muscle spasm, she declines cortisone injection.  #7: Her labs are up-to-date and normal as of November 2017.  Orders: No orders of the defined types were placed in this encounter.  Meds ordered this encounter  Medications  . cyclobenzaprine (FLEXERIL) 10 MG tablet    Sig: Take 1 tablet (10 mg total) by mouth at bedtime.    Dispense:  90 tablet    Refill:  1    Order Specific Question:   Supervising Provider    Answer:   Bo Merino [phone]    Face-to-face time spent with patient was 30 minutes. 50% of time was spent in counseling and coordination of care.  Follow-Up Instructions: Return in about 6 months (around 04/14/2017) for FMS, [place], smoking cessation.   Eliezer Lofts, PA-C  Note - This record has been created using [employer].  Chart creation errors have been sought, but may not always  have been located. Such creation errors do not reflect on  the standard of medical care.

## 2016-10-25 ENCOUNTER — Other Ambulatory Visit: Payer: Self-pay | Admitting: Cardiology

## 2016-10-25 NOTE — Telephone Encounter (Signed)
Rx request sent to pharmacy.  

## 2016-10-31 ENCOUNTER — Telehealth: Payer: Self-pay | Admitting: Pulmonary Disease

## 2016-10-31 MED ORDER — GLYCOPYRROLATE-FORMOTEROL 9-4.8 MCG/ACT IN AERO: 2.0000 | INHALATION_SPRAY | Freq: Two times a day (BID) | RESPIRATORY_TRACT | 5 refills | Status: DC

## 2016-10-31 NOTE — Telephone Encounter (Signed)
Spoke pt home health nurse, Arville Go. Arville Go states pt was given samples of bevespi at her OV with VS on 09/04/16. Arville Go states pt has been a little delayed with trying these samples, but has now did so. Pt has noticed a different while on bevespi, and is requesting an Rx to be sent in. Rx has been sent to preferred pharmacy. Nothing further needed.

## 2016-11-20 NOTE — Progress Notes (Signed)
Diane [name] Date of Birth: 1955/10/22 Medical Record #[id number]  History of Present Illness: Diane [name] is seen for follow up CAD. She has a history of coronary disease and is status post CABG in March of 2007 by Dr. [name]. She presented later that year with recurrent angina. Repeat cardiac cath showed patent LIMA to the LAD but all other grafts occluded including SVG to OM, SVG to AC/PL, and radial graft to ramus intermediate. She then had complex stenting of the mid LCx and first OM with Taxus stents. The native RCA was occluded with collaterals.   In 2016 she was evaluated for abdominal surgery. Myoview abnormal. Cardiac cath as noted above but now stents in LCx and OM occluded. No suitable targets for PCI or surgery. Severe LV dysfunction with moderate pulmonary HTN and normal LV filling pressures. She has multiple cardiac risk factors including diabetes, dyslipidemia, hypertension, and tobacco abuse. She also has  bilateral carotid arterial disease. She continues to smoke.   In August 2016 she had removal of a large pelvic mass with BSO and sigmoid colectomy. Path c/w ovarian cystadenoma. No complications.  . She is followed by Dr. Gwenlyn Found for her carotid disease. Angiogram performed with findings of 70% bilateral stenoses. The LICA was heavily calcified. Both arteries had acute angulation after the stenosis making it less favorable for stenting. In March she developed dizziness, lightheadedness, and imbalance. Dr. Glori Bickers ordered an MRI that showed an old right MCA infarct and chronic lacunar infarcts that were new from 2014.   On her prior visit with me we switched her losartan to [facility]. She developed a persistent cough with clear phlegm. [name] was stopped and losartan resumed- cough resolved.  CXR showed no active disease.   On follow up today she complains of leg pain particularly in her thighs. Hurts at rest and with exertion. Denies any increased SOB or chest pain. No new  neurologic symptoms. She has lost 4 lbs.   Current Outpatient Prescriptions on File Prior to Visit  Medication Sig Dispense Refill  . aspirin EC 81 MG tablet Take 1 tablet (81 mg total) by mouth daily. 30 tablet 6  . carvedilol (COREG) 25 MG tablet TAKE 1 TABLET (25 MG TOTAL) BY MOUTH 2 (TWO) TIMES DAILY WITH A MEAL. 180 tablet 1  . clopidogrel (PLAVIX) 75 MG tablet TAKE 1 TABLET BY MOUTH EVERY DAY 90 tablet 0  . cyclobenzaprine (FLEXERIL) 10 MG tablet Take 1 tablet (10 mg total) by mouth at bedtime. 90 tablet 1  . dexlansoprazole (DEXILANT) 60 MG capsule TAKE 1 CAPSULE (60 MG TOTAL) BY MOUTH DAILY. 30 capsule 11  . diphenhydrAMINE (BENADRYL) 25 mg capsule Take 2 capsules in AM and 1 capsule at night 270 capsule 3  . fluticasone (FLONASE) 50 MCG/ACT nasal spray Place 2 sprays into both nostrils daily. 16 g 2  . furosemide (LASIX) 40 MG tablet TAKE 1 TABLET (40 MG TOTAL) BY MOUTH DAILY. 90 tablet 3  . gabapentin (NEURONTIN) 300 MG capsule Take 2 capsules (600 mg total) by mouth at bedtime. 60 capsule 3  . glimepiride (AMARYL) 2 MG tablet Take 2 mg by mouth 2 (two) times daily.     . Glycopyrrolate-Formoterol (BEVESPI AEROSPHERE) 9-4.8 MCG/ACT AERO Inhale 2 puffs into the lungs 2 (two) times daily. 1 Inhaler 5  . guaiFENesin (MUCINEX) 600 MG 12 hr tablet Take 2 tablets (1,200 mg total) by mouth 2 (two) times daily. 360 tablet 3  . hydrochlorothiazide (HYDRODIURIL) 50 MG tablet TAKE 1  TABLET BY MOUTH DAILY 30 tablet 6  . isosorbide mononitrate (IMDUR) 30 MG 24 hr tablet Take 1 tablet (30 mg total) by mouth daily. 180 tablet 2  . KLOR-CON M10 10 MEQ tablet TAKE 1 TABLET (10 MEQ TOTAL) BY MOUTH 2 (TWO) TIMES DAILY. 180 tablet 0  . levETIRAcetam (KEPPRA) 750 MG tablet TAKE 1 TABLET (750 MG TOTAL) BY MOUTH 2 (TWO) TIMES DAILY. 60 tablet 5  . losartan (COZAAR) 100 MG tablet Take 1 tablet (100 mg total) by mouth daily. 90 tablet 3  . metFORMIN (GLUCOPHAGE-XR) 500 MG 24 hr tablet Take 1,000 mg by mouth 2  (two) times daily.  6  . nicotine (NICODERM CQ - DOSED IN MG/24 HOURS) 21 mg/24hr patch Place 21 mg onto the skin daily.    . nitroGLYCERIN (NITROLINGUAL) 0.4 MG/SPRAY spray Place 1 spray under the tongue every 5 (five) minutes x 3 doses as needed for chest pain. 12 g 1  . ONE TOUCH ULTRA TEST test strip USE AS DIRECTED FOR TESTING BLOOD GLUCOSE 3 TIMES DAILY  1  . phenytoin (DILANTIN) 100 MG ER capsule Take 1 capsule (100 mg total) by mouth 2 (two) times daily. 60 capsule 11  . RANEXA 1000 MG SR tablet TAKE 1 TABLET BY MOUTH TWICE A DAY 180 tablet 1  . rosuvastatin (CRESTOR) 10 MG tablet TAKE 1 TABLET BY MOUTH DAILY 90 tablet 3  . VENTOLIN HFA 108 (90 Base) MCG/ACT inhaler Inhale 1 puff into the lungs as needed.    . vitamin B-12 (CYANOCOBALAMIN) 1000 MCG tablet Take 1,000 mcg by mouth daily.     No current facility-administered medications on file prior to visit.     Allergies  Allergen Reactions  . Bee Venom Itching and Swelling  . Entresto [Sacubitril-Valsartan] Cough  . Tetracycline     REACTION: reaction not known  . Ace Inhibitors Cough    Past Medical History:  Diagnosis Date  . Anemia   . Carotid stenosis   . CHF (congestive heart failure) ([name])   . Chronic bronchitis ([place])   . Coronary artery disease    post CABG in 3/07   . CVA (cerebral infarction) 1993  . Diabetes mellitus    type 2  . Dyslipidemia   . Fibromyalgia   . GERD (gastroesophageal reflux disease)   . Goiter   . Headache    hx of   . Hyperlipidemia   . Hypertension   . Myocardial infarction   . Pneumonia    hx of   . PONV (postoperative nausea and vomiting)    only once was patient sick in the 1980s   . Seizures ([place])    last seizure- 03/2013   . Shortness of breath dyspnea    with exertion or when fluid builds up   . Sleep apnea    used to wear a cpap- not used in 3 years   . Stroke ([place]) 1993  . Systolic murmur    known mild AS and MR  . Tobacco abuse     Past Surgical History:   Procedure Laterality Date  . APPENDECTOMY    . CARDIAC CATHETERIZATION  11/29/05   EF of 55%  . CARDIAC CATHETERIZATION  08/06/06   EF of 45-50%  . CARDIAC CATHETERIZATION N/A 05/06/2015   Procedure: Right/Left Heart Cath and Coronary/Graft Angiography;  Surgeon: Devina Bezold M Martinique, MD;  Location: [place] [facility];  Service: Cardiovascular;  Laterality: N/A;  . CERVICAL FUSION  1990  . CHOLECYSTECTOMY    .  CORONARY ARTERY BYPASS GRAFT  12/04/2005   x5 -- left internal mammary artery to the LAD, left radial artery to the ramus intermedius, saphenous vein graft to the obtuse marginal 1, sequential saphenous vein grat to the acute marginal and posterior descending, endoscopic vein harvesting from the left thigh with open vein harvest from right leg  . CORONARY STENT PLACEMENT  08/11/06   PCI of her ciurcumflex/OM vessel  . LAPAROTOMY Bilateral 05/19/2015   Procedure: EXPLORATORY LAPAROTOMY WITH BILATERAL SALPINGO OOPHORECTOMY /OMENTECTOMY/SEGMENTAL SIGMOID COLECTOMY ;  Surgeon: Everitt Amber, MD;  Location: [facility];  Service: Gynecology;  Laterality: Bilateral;  . PERIPHERAL VASCULAR CATHETERIZATION N/A 11/15/2015   Procedure: Carotid PTA/Stent Intervention;  Surgeon: Lorretta Harp, MD;  Location: [place] [facility];  Service: Cardiovascular;  Laterality: N/A;  . PERIPHERAL VASCULAR CATHETERIZATION  11/15/2015   Procedure: Carotid Angiography;  Surgeon: Lorretta Harp, MD;  Location: [place] [facility];  Service: Cardiovascular;;    History  Smoking Status  . Current Every Day Smoker  . Packs/day: 1.50  . Years: 50.00  . Types: Cigarettes  Smokeless Tobacco  . Never Used    Comment: 1.5 pack to 2 packs per day    History  Alcohol Use No    Family History  Problem Relation Age of Onset  . Heart disease Mother   . Diabetes Mother   . COPD Mother   . Hyperlipidemia Mother   . Hypertension Mother   . Cancer Father   . Drug abuse Paternal Grandmother   . Stroke Paternal Grandfather   .  Coronary artery disease    . Heart disease    . Cancer Paternal Aunt     lung with mets to brain  . Cancer Paternal Uncle     skin cancer  . Cancer Paternal Uncle     lung/liver to brain  . Cancer Paternal Uncle     cancer of unknown type  . Hypertension Sister   . Cancer Sister   . Glaucoma Sister     Review of Systems: As noted in history of present illness.  All other systems were reviewed and are negative.  Physical Exam: BP 106/60   Pulse 75   Ht '4\' 10"'$  (1.473 m)   Wt 144 lb (65.3 kg)   LMP 09/02/2003   BMI 30.10 kg/m  She is a  white female in no acute distress. She is normocephalic, atraumatic. Pupils are equal round and reactive. Sclera clear. Oropharynx is clear. Neck reveals bilateral carotid bruits. There is no JVD. Lungs are clear. Cardiac exam reveals a grade 5-9/5 systolic murmur at the right upper sternal border. Abdomen soft and NT. No masses. She has no edema. Pedal pulses are 1+ and palpable. Neuro alert and oriented x 3. No focal findings.  LABORATORY DATA: Lab Results  Component Value Date   WBC 7.5 08/13/2016   HGB 15.8 (H) 08/13/2016   HCT 45.5 08/13/2016   PLT 240.0 08/13/2016   GLUCOSE 202 (H) 08/13/2016   CHOL 179 08/13/2016   TRIG 183.0 (H) 08/13/2016   HDL 65.20 08/13/2016   LDLDIRECT 98.0 06/09/2013   LDLCALC 77 08/13/2016   ALT 13 08/13/2016   AST 10 08/13/2016   NA 136 08/13/2016   K 5.1 08/13/2016   CL 99 08/13/2016   CREATININE 0.67 08/13/2016   BUN 9 08/13/2016   CO2 30 08/13/2016   TSH 1.35 08/13/2016   INR 1.05 11/10/2015   HGBA1C 8.0 (H) 06/09/2014   MICROALBUR 1.9  06/09/2013   Ecg today shows NSR with poor R wave progression. Old inferior infarct. TWA c/w lateral ischemia. No acute change. I have personally reviewed and interpreted this study.  Assessment / Plan: 1. CAD S/p CABG. Failed bypass grafts except for LIMA to LAD. S/p remote stenting of LCX and OM with Taxus DES now occluded. Stable class 2 angina. Continue  aggressive antianginal Rx. With ASA, plavix, nitrates, coreg, and Ranexa. Stressed importance of smoking cessation.  2. Carotid arterial disese- moderate, 70% bilateral. Not favorable for stenting. She is a poor surgical candidate. Carotid dopplers in September 2017 unchanged. Followed by Dr Gwenlyn Found. 3. Tobacco abuse. Recommend complete cessation. Patient states she is going to keep trying to quit. 4. HTN controlled. 5. Chronic combined systolic and diastolic CHF. Severe LV dysfunction. By cath EDP was normal.  Continue current medication. Sodium restriction.  6. Pulmonary HTN. 7. S/p major abdominal surgery for cystadenoma. No complications. She is going to follow up with GYN concerning her symptoms.  8. Lacunar infarcts. Explained that this is more likely related to chronic HTN and not her carotid disease. 9. Cough. Secondary to [facility]- resolved. 10. Leg pain. No prior dopplers that I can find. Given extensive history of vascular disease will schedule for LE arterial dopplers.   I will follow up in 3 months.

## 2016-11-21 ENCOUNTER — Encounter: Payer: Self-pay | Admitting: Cardiology

## 2016-11-21 ENCOUNTER — Ambulatory Visit (INDEPENDENT_AMBULATORY_CARE_PROVIDER_SITE_OTHER): Payer: Medicare Other | Admitting: Cardiology

## 2016-11-21 VITALS — BP 106/60 | HR 75 | Ht <= 58 in | Wt 144.0 lb

## 2016-11-21 DIAGNOSIS — F17200 Nicotine dependence, unspecified, uncomplicated: Secondary | ICD-10-CM

## 2016-11-21 DIAGNOSIS — I739 Peripheral vascular disease, unspecified: Secondary | ICD-10-CM | POA: Diagnosis not present

## 2016-11-21 DIAGNOSIS — E7800 Pure hypercholesterolemia, unspecified: Secondary | ICD-10-CM | POA: Diagnosis not present

## 2016-11-21 DIAGNOSIS — I209 Angina pectoris, unspecified: Secondary | ICD-10-CM | POA: Diagnosis not present

## 2016-11-21 DIAGNOSIS — I25708 Atherosclerosis of coronary artery bypass graft(s), unspecified, with other forms of angina pectoris: Secondary | ICD-10-CM

## 2016-11-21 DIAGNOSIS — I10 Essential (primary) hypertension: Secondary | ICD-10-CM | POA: Diagnosis not present

## 2016-11-21 DIAGNOSIS — I5042 Chronic combined systolic (congestive) and diastolic (congestive) heart failure: Secondary | ICD-10-CM | POA: Diagnosis not present

## 2016-11-21 DIAGNOSIS — I779 Disorder of arteries and arterioles, unspecified: Secondary | ICD-10-CM

## 2016-11-21 MED ORDER — CARVEDILOL 25 MG PO TABS: 25.0000 mg | ORAL_TABLET | Freq: Two times a day (BID) | ORAL | 3 refills | Status: DC

## 2016-11-21 MED ORDER — ISOSORBIDE MONONITRATE ER 30 MG PO TB24: 30.0000 mg | ORAL_TABLET | Freq: Every day | ORAL | 3 refills | Status: DC

## 2016-11-21 MED ORDER — FUROSEMIDE 40 MG PO TABS: 40.0000 mg | ORAL_TABLET | Freq: Every day | ORAL | 3 refills | Status: DC

## 2016-11-21 MED ORDER — RANOLAZINE ER 1000 MG PO TB12: 1000.0000 mg | ORAL_TABLET | Freq: Two times a day (BID) | ORAL | 3 refills | Status: DC

## 2016-11-21 MED ORDER — POTASSIUM CHLORIDE CRYS ER 10 MEQ PO TBCR: 10.0000 meq | EXTENDED_RELEASE_TABLET | Freq: Two times a day (BID) | ORAL | 3 refills | Status: DC

## 2016-11-21 MED ORDER — LOSARTAN POTASSIUM 100 MG PO TABS: 100.0000 mg | ORAL_TABLET | Freq: Every day | ORAL | 3 refills | Status: DC

## 2016-11-21 MED ORDER — CLOPIDOGREL BISULFATE 75 MG PO TABS: 75.0000 mg | ORAL_TABLET | Freq: Every day | ORAL | 3 refills | Status: DC

## 2016-11-21 MED ORDER — HYDROCHLOROTHIAZIDE 50 MG PO TABS: 50.0000 mg | ORAL_TABLET | Freq: Every day | ORAL | 3 refills | Status: DC

## 2016-11-21 NOTE — Patient Instructions (Signed)
We will schedule you for lower extremity arterial dopplers.  Continue your current therapy

## 2016-11-21 NOTE — Addendum Note (Signed)
Addended by: Kathyrn Lass on: 11/21/2016 11:47 AM   Modules accepted: Orders

## 2016-12-03 ENCOUNTER — Ambulatory Visit: Payer: Self-pay | Admitting: Cardiology

## 2016-12-11 ENCOUNTER — Other Ambulatory Visit: Payer: Self-pay | Admitting: Cardiology

## 2016-12-11 ENCOUNTER — Telehealth: Payer: Self-pay | Admitting: Pulmonary Disease

## 2016-12-11 MED ORDER — FLUTICASONE PROPIONATE 50 MCG/ACT NA SUSP: 2.0000 | Freq: Every day | NASAL | 2 refills | Status: DC

## 2016-12-11 NOTE — Telephone Encounter (Signed)
Spoke with pt. She is requesting a refill on Flonase. Rx has been sent in. Nothing further was needed.

## 2016-12-11 NOTE — Telephone Encounter (Signed)
REFILL 

## 2016-12-12 ENCOUNTER — Other Ambulatory Visit: Payer: Self-pay | Admitting: Cardiology

## 2016-12-12 ENCOUNTER — Ambulatory Visit (HOSPITAL_COMMUNITY)
Admission: RE | Admit: 2016-12-12 | Discharge: 2016-12-12 | Disposition: A | Discharge status: Home / Self Care | Payer: Medicare Other | Source: Ambulatory Visit | Attending: Cardiovascular Disease | Admitting: Cardiovascular Disease

## 2016-12-12 DIAGNOSIS — I739 Peripheral vascular disease, unspecified: Secondary | ICD-10-CM

## 2016-12-12 DIAGNOSIS — I10 Essential (primary) hypertension: Secondary | ICD-10-CM

## 2016-12-12 DIAGNOSIS — F17200 Nicotine dependence, unspecified, uncomplicated: Secondary | ICD-10-CM | POA: Insufficient documentation

## 2016-12-12 DIAGNOSIS — I25708 Atherosclerosis of coronary artery bypass graft(s), unspecified, with other forms of angina pectoris: Secondary | ICD-10-CM | POA: Diagnosis not present

## 2016-12-12 DIAGNOSIS — E7800 Pure hypercholesterolemia, unspecified: Secondary | ICD-10-CM | POA: Diagnosis not present

## 2016-12-17 ENCOUNTER — Ambulatory Visit: Payer: Self-pay | Admitting: Neurology

## 2016-12-21 ENCOUNTER — Other Ambulatory Visit: Payer: Self-pay | Admitting: Pulmonary Disease

## 2016-12-25 ENCOUNTER — Other Ambulatory Visit: Payer: Self-pay | Admitting: Cardiology

## 2016-12-26 ENCOUNTER — Ambulatory Visit (INDEPENDENT_AMBULATORY_CARE_PROVIDER_SITE_OTHER): Payer: Medicare Other | Admitting: Neurology

## 2016-12-26 ENCOUNTER — Encounter: Payer: Self-pay | Admitting: Neurology

## 2016-12-26 VITALS — BP 104/68 | HR 77 | Ht <= 58 in | Wt 144.7 lb

## 2016-12-26 DIAGNOSIS — G40009 Localization-related (focal) (partial) idiopathic epilepsy and epileptic syndromes with seizures of localized onset, not intractable, without status epilepticus: Secondary | ICD-10-CM

## 2016-12-26 DIAGNOSIS — F17200 Nicotine dependence, unspecified, uncomplicated: Secondary | ICD-10-CM

## 2016-12-26 DIAGNOSIS — M5010 Cervical disc disorder with radiculopathy, unspecified cervical region: Secondary | ICD-10-CM

## 2016-12-26 DIAGNOSIS — I25708 Atherosclerosis of coronary artery bypass graft(s), unspecified, with other forms of angina pectoris: Secondary | ICD-10-CM | POA: Diagnosis not present

## 2016-12-26 MED ORDER — PHENYTOIN SODIUM EXTENDED 100 MG PO CAPS: 100.0000 mg | ORAL_CAPSULE | Freq: Every day | ORAL | 3 refills | Status: DC

## 2016-12-26 MED ORDER — GABAPENTIN 300 MG PO CAPS: 300.0000 mg | ORAL_CAPSULE | Freq: Three times a day (TID) | ORAL | 3 refills | Status: DC

## 2016-12-26 MED ORDER — LEVETIRACETAM 750 MG PO TABS: ORAL_TABLET | ORAL | 3 refills | Status: DC

## 2016-12-26 NOTE — Patient Instructions (Signed)
1.  Gabapentin '300mg'$  three times daily 2.  Dilantin '100mg'$  at bedtime 3.  Keppra '750mg'$  twice daily 4.  Contact me if you experience any spells. 5.  Try to quit smoking 6.  Follow up in 4 months.

## 2016-12-26 NOTE — Progress Notes (Signed)
NEUROLOGY FOLLOW UP OFFICE NOTE  Diane FOGLESONG [id number]  HISTORY OF PRESENT ILLNESS: Diane [name] is a 61 year old right-handed woman with CAD s/p CABG, hypertension, hyperlipidemia, type II diabetes mellitus, tobacco use, stroke, depression, fibromyalgia, migraine, OSA and COPD who follows up for neck pain and seizure disorder.     UPDATE: Seizure disorder: Medications:  Dilantin ER '100mg'$  twice daily, Keppra '750mg'$  twice daily, calcium '1500mg'$  daily, vitamin D 1000 IU daily Last spell:  03/18/13.  She would like to discontinue one of her anticonvulsants. Phenytoin level from 12/13/15 was not detectable.  No change was made as patient had been seizure-free.  Labs from 08/13/16 include CBC with WBC 7.5, HGB 15.8, HCT 45.5 and PLT 240; CMP with Na 136, K 5.1, Cl 99, CO2 30, glucose 202, BUN 9, Cr 0.67, total bili 0.3, ALP 56, AST 10, ALT 13.   Neck pain: She is taking gabapentin '600mg'$  at bedtime.  Neck and generalized body pain increased.   HISTORY: Seizure disorder: She was admitted to [facility] from 03/03/13 to 03/06/13 for recurrent seizure-like episodes.  These spells are were witnessed by Dr. Wallie Char, neuro-hospitalist, and described as "head turning to the right as well as eye deviation to the right, stiffening of right extremities with flexion of right upper extremity, and convulsive movements of right extremities. Spell lasted about 40-50 seconds and resolved without intervention. Within 30 seconds patient was again awake and conversant. She was slightly confused with respect to time."  She is unable to speak during these events.  Sometimes she was aware during these events and other times she was unaware and amnestic.  She did have urinary incontinence but no tongue biting.  It is reported that she can write down what she wants to say during these spells.  Given her stroke history, she was evaluated for possible left hemispheric stroke.  By his assessment, it seemed that  her speech difficulties were non-organic, seizure-like activity non-organic, and he did not suspect acute stroke.     She had continuous EEG monitoring, which captured multiple spells without electrographic correlate.  However, she did have one captured clinical event, described as "build up to rhythmical theta activity and resultant slowing companied by clinical changes.  This may very well represent epileptiform activity.  No interictal abnormalities were noted".  Psychiatry was consulted, regarding possible pseudoseizures.  Despite the possible diagnosis of pseudoseizures, the neuro-hospitalists never made a sole diagnosis of pseudoseizures in the hospital notes.     I personally reviewed the EEG.  At 1:57:48, patient pauses, head slightly turned left, staring, with little movement and no convulsions.  She does pull at the covers a little, but no appreciable stereotypical hand movements.  When it is over, she begins moving around again.  Electrographically, she does have a build up of generalized theta activity up until 1:58:26, followed by generalized delta slowing lasting until 1:58:45.  Difficult to lateralize, but may be more prominent in the left hemisphere.  However, this appears to only be a clip of the event and the rest of the study is not available to me.  Therefore I cannot comment on the other captured spells.   MRI of brain performed on 03/03/13, which reported possible tiny acute infarct in the posterior left frontal lobe, but neurology believed this to be artifact.  There was also mention of incidental tiny cysts in right hippocampal region.  Encephalomalacia in the right frontoparietal region, from prior remote stroke, is seen.  MRI of brain without contrast from 12/08/15 showed chronic right MCA infarct but also showed chronic looking lacunar infarcts in the bilateral basal ganglia and left thalamus, which are new compared to prior imaging from June 2014.   Cervical radiculopathy: She has  history of right sided neck pain radiating down to the right shoulder, associated with tingling from the right ear down to the shoulder.  Hot and cold compresses help too.  No numbness, pain or weakness down the arm.  Pain worse when turning neck to the left.  She has trigger finger and numbness in the fingers when driving or picking up objects, thought to be carpal tunnel syndrome.  She has history of cervical fusion in the 1980s.  PT was ineffective.  [name]  MRI of the cervical spine performed on 05/23/14 showed moderately large foraminal disc protrusion and associated osteophyte at C3-4, causing right foraminal encroachment.  Solid fusion C5 through C7 noted.  She was referred for right C4 nerve root injection, however it was not done.   PAST MEDICAL HISTORY: Past Medical History:  Diagnosis Date  . Anemia   . Carotid stenosis   . CHF (congestive heart failure) ([place])   . Chronic bronchitis ([place])   . Coronary artery disease    post CABG in 3/07   . CVA (cerebral infarction) 1993  . Diabetes mellitus    type 2  . Dyslipidemia   . Fibromyalgia   . GERD (gastroesophageal reflux disease)   . Goiter   . Headache    hx of   . Hyperlipidemia   . Hypertension   . Myocardial infarction   . Pneumonia    hx of   . PONV (postoperative nausea and vomiting)    only once was patient sick in the 1980s   . Seizures ([place])    last seizure- 03/2013   . Shortness of breath dyspnea    with exertion or when fluid builds up   . Sleep apnea    used to wear a cpap- not used in 3 years   . Stroke ([place]) 1993  . Systolic murmur    known mild AS and MR  . Tobacco abuse     MEDICATIONS: Current Outpatient Prescriptions on File Prior to Visit  Medication Sig Dispense Refill  . aspirin EC 81 MG tablet Take 1 tablet (81 mg total) by mouth daily. 30 tablet 6  . carvedilol (COREG) 25 MG tablet Take 1 tablet (25 mg total) by mouth 2 (two) times daily with a meal. 180 tablet 3  . clopidogrel (PLAVIX) 75 MG tablet  Take 1 tablet (75 mg total) by mouth daily. (Patient taking differently: Take 75 mg by mouth. ) 90 tablet 3  . cyclobenzaprine (FLEXERIL) 10 MG tablet Take 1 tablet (10 mg total) by mouth at bedtime. 90 tablet 1  . dexlansoprazole (DEXILANT) 60 MG capsule TAKE 1 CAPSULE (60 MG TOTAL) BY MOUTH DAILY. 30 capsule 11  . diphenhydrAMINE (BENADRYL) 25 mg capsule Take 2 capsules in AM and 1 capsule at night 270 capsule 3  . fluticasone (FLONASE) 50 MCG/ACT nasal spray Place 2 sprays into both nostrils daily. 16 g 2  . furosemide (LASIX) 40 MG tablet Take 1 tablet (40 mg total) by mouth daily. 90 tablet 3  . glimepiride (AMARYL) 2 MG tablet Take 2 mg by mouth 2 (two) times daily.     . Glycopyrrolate-Formoterol (BEVESPI AEROSPHERE) 9-4.8 MCG/ACT AERO Inhale 2 puffs into the lungs 2 (two) times daily. 1 Inhaler  5  . guaiFENesin (MUCINEX) 600 MG 12 hr tablet Take 2 tablets (1,200 mg total) by mouth 2 (two) times daily. 360 tablet 3  . hydrochlorothiazide (HYDRODIURIL) 50 MG tablet Take 1 tablet (50 mg total) by mouth daily. 90 tablet 3  . isosorbide mononitrate (IMDUR) 30 MG 24 hr tablet Take 1 tablet (30 mg total) by mouth daily. (Patient taking differently: Take 30 mg by mouth 2 (two) times daily. ) 90 tablet 3  . losartan (COZAAR) 100 MG tablet Take 1 tablet (100 mg total) by mouth daily. 90 tablet 3  . metFORMIN (GLUCOPHAGE-XR) 500 MG 24 hr tablet Take 1,000 mg by mouth 2 (two) times daily.  6  . nitroGLYCERIN (NITROLINGUAL) 0.4 MG/SPRAY spray PLACE 1 SPRAY UNDER THE TONGUE EVERY 5 (FIVE) MINUTES X 3 DOSES AS NEEDED FOR CHEST PAIN. 0.4 g 2  . ONE TOUCH ULTRA TEST test strip USE AS DIRECTED FOR TESTING BLOOD GLUCOSE 3 TIMES DAILY  1  . potassium chloride (KLOR-CON M10) 10 MEQ tablet Take 1 tablet (10 mEq total) by mouth 2 (two) times daily. 180 tablet 3  . ranolazine (RANEXA) 1000 MG SR tablet Take 1 tablet (1,000 mg total) by mouth 2 (two) times daily. 180 tablet 3  . rosuvastatin (CRESTOR) 10 MG tablet  TAKE 1 TABLET BY MOUTH DAILY 90 tablet 3  . VENTOLIN HFA 108 (90 Base) MCG/ACT inhaler Inhale 1 puff into the lungs as needed.    . VENTOLIN HFA 108 (90 Base) MCG/ACT inhaler INHALE 2 PUFFS EVERY 6 HOURS AS NEEDED FOR WHEEZING/SHORTNESS OF BREATH 18 Inhaler 5  . vitamin B-12 (CYANOCOBALAMIN) 1000 MCG tablet Take 1,000 mcg by mouth daily.    . nicotine (NICODERM CQ - DOSED IN MG/24 HOURS) 21 mg/24hr patch Place 21 mg onto the skin daily.     No current facility-administered medications on file prior to visit.     ALLERGIES: Allergies  Allergen Reactions  . Bee Venom Itching and Swelling  . Entresto [Sacubitril-Valsartan] Cough  . Tetracycline     REACTION: reaction not known  . Ace Inhibitors Cough    FAMILY HISTORY: Family History  Problem Relation Age of Onset  . Heart disease Mother   . Diabetes Mother   . COPD Mother   . Hyperlipidemia Mother   . Hypertension Mother   . Cancer Father   . Drug abuse Paternal Grandmother   . Stroke Paternal Grandfather   . Coronary artery disease    . Heart disease    . Cancer Paternal Aunt     lung with mets to brain  . Cancer Paternal Uncle     skin cancer  . Cancer Paternal Uncle     lung/liver to brain  . Cancer Paternal Uncle     cancer of unknown type  . Hypertension Sister   . Cancer Sister   . Glaucoma Sister     SOCIAL HISTORY: Social History   Social History  . Marital status: Divorced    Spouse name: N/A  . Number of children: N/A  . Years of education: N/A   Occupational History  . Not on file.   Social History Main Topics  . Smoking status: Current Every Day Smoker    Packs/day: 1.50    Years: 50.00    Types: Cigarettes  . Smokeless tobacco: Never Used     Comment: 1.5 pack to 2 packs per day  . Alcohol use No  . Drug use: No  . Sexual activity: Not Currently  Partners: Male    Birth control/ protection: None   Other Topics Concern  . Not on file   Social History Narrative  . No narrative on  file    REVIEW OF SYSTEMS: Constitutional: No fevers, chills, or sweats, no generalized fatigue, change in appetite Eyes: No visual changes, double vision, eye pain Ear, nose and throat: No hearing loss, ear pain, nasal congestion, sore throat Cardiovascular: No chest pain, palpitations Respiratory:  No shortness of breath at rest or with exertion, wheezes GastrointestinaI: No nausea, vomiting, diarrhea, abdominal pain, fecal incontinence Genitourinary:  No dysuria, urinary retention or frequency Musculoskeletal:  No neck pain, back pain Integumentary: No rash, pruritus, skin lesions Neurological: as above Psychiatric: No depression, insomnia, anxiety Endocrine: No palpitations, fatigue, diaphoresis, mood swings, change in appetite, change in weight, increased thirst Hematologic/Lymphatic:  No purpura, petechiae. Allergic/Immunologic: no itchy/runny eyes, nasal congestion, recent allergic reactions, rashes  PHYSICAL EXAM: Vitals:   12/26/16 1356  BP: 104/68  Pulse: 77   General: No acute distress.  Patient appears well-groomed.   Head:  Normocephalic/atraumatic Eyes:  Fundi examined but not visualized Neck: supple, no paraspinal tenderness, full range of motion Heart:  Regular rate and rhythm Lungs:  Clear to auscultation bilaterally Back: No paraspinal tenderness Neurological Exam: alert and oriented to person, place, and time. Attention span and concentration intact, recent and remote memory intact, fund of knowledge intact.  Speech fluent and not dysarthric, language intact.  CN II-XII intact. Bulk and tone normal, muscle strength 5/5 throughout.  Sensation to light touch  intact.  Deep tendon reflexes 2+ throughout.  Finger to nose testing intact.  Gait normal, Romberg positive.  IMPRESSION: Localization related epilepsy with complex partial seizures stable Cervical radiculopathy Tobacco use  PLAN: 1.  We will decrease Dilantin to '100mg'$  at bedtime (level was undetectable  last time, anyway). 2.  Continue Keppra '750mg'$  twice daily 3.  Increase gabapentin to '300mg'$  three times daily 4.  Cautioned for possible increase risk for seizure and monitor for any spells. 5.  Smoking cessation 6.  Follow up in 4 months.  25 minutes spent face to face with patient, over 50% spent discussing risks and benefits of decreasing seizure medication.  Metta Clines, DO  CC: Loura Pardon, MD

## 2017-01-02 ENCOUNTER — Telehealth: Payer: Self-pay | Admitting: Family Medicine

## 2017-01-02 NOTE — Telephone Encounter (Signed)
Left pt message asking to call Allison back directly at [phone] to schedule AWV.+ labs with Lesia and CPE with PCP. °

## 2017-01-05 DIAGNOSIS — E119 Type 2 diabetes mellitus without complications: Secondary | ICD-10-CM | POA: Diagnosis not present

## 2017-01-05 DIAGNOSIS — I5042 Chronic combined systolic (congestive) and diastolic (congestive) heart failure: Secondary | ICD-10-CM | POA: Diagnosis not present

## 2017-01-05 DIAGNOSIS — G40909 Epilepsy, unspecified, not intractable, without status epilepticus: Secondary | ICD-10-CM | POA: Diagnosis not present

## 2017-01-05 DIAGNOSIS — I110 Hypertensive heart disease with heart failure: Secondary | ICD-10-CM | POA: Diagnosis not present

## 2017-01-05 DIAGNOSIS — J449 Chronic obstructive pulmonary disease, unspecified: Secondary | ICD-10-CM | POA: Diagnosis not present

## 2017-01-05 DIAGNOSIS — I2510 Atherosclerotic heart disease of native coronary artery without angina pectoris: Secondary | ICD-10-CM | POA: Diagnosis not present

## 2017-01-05 DIAGNOSIS — Z720 Tobacco use: Secondary | ICD-10-CM | POA: Diagnosis not present

## 2017-01-07 ENCOUNTER — Other Ambulatory Visit: Payer: Self-pay | Admitting: Neurology

## 2017-01-14 ENCOUNTER — Ambulatory Visit: Payer: Medicare Other | Attending: Gynecologic Oncology | Admitting: Gynecologic Oncology

## 2017-01-14 ENCOUNTER — Encounter: Payer: Self-pay | Admitting: Gynecologic Oncology

## 2017-01-14 DIAGNOSIS — F17210 Nicotine dependence, cigarettes, uncomplicated: Secondary | ICD-10-CM | POA: Diagnosis not present

## 2017-01-14 DIAGNOSIS — Z8673 Personal history of transient ischemic attack (TIA), and cerebral infarction without residual deficits: Secondary | ICD-10-CM | POA: Insufficient documentation

## 2017-01-14 DIAGNOSIS — Z7982 Long term (current) use of aspirin: Secondary | ICD-10-CM | POA: Diagnosis not present

## 2017-01-14 DIAGNOSIS — Z7984 Long term (current) use of oral hypoglycemic drugs: Secondary | ICD-10-CM | POA: Insufficient documentation

## 2017-01-14 DIAGNOSIS — K432 Incisional hernia without obstruction or gangrene: Secondary | ICD-10-CM | POA: Insufficient documentation

## 2017-01-14 DIAGNOSIS — I110 Hypertensive heart disease with heart failure: Secondary | ICD-10-CM | POA: Diagnosis not present

## 2017-01-14 DIAGNOSIS — K5900 Constipation, unspecified: Secondary | ICD-10-CM | POA: Insufficient documentation

## 2017-01-14 DIAGNOSIS — Z955 Presence of coronary angioplasty implant and graft: Secondary | ICD-10-CM | POA: Diagnosis not present

## 2017-01-14 DIAGNOSIS — Z86018 Personal history of other benign neoplasm: Secondary | ICD-10-CM | POA: Diagnosis not present

## 2017-01-14 DIAGNOSIS — Z8249 Family history of ischemic heart disease and other diseases of the circulatory system: Secondary | ICD-10-CM | POA: Insufficient documentation

## 2017-01-14 DIAGNOSIS — R109 Unspecified abdominal pain: Secondary | ICD-10-CM | POA: Insufficient documentation

## 2017-01-14 DIAGNOSIS — J42 Unspecified chronic bronchitis: Secondary | ICD-10-CM | POA: Diagnosis not present

## 2017-01-14 DIAGNOSIS — I2510 Atherosclerotic heart disease of native coronary artery without angina pectoris: Secondary | ICD-10-CM | POA: Insufficient documentation

## 2017-01-14 DIAGNOSIS — I509 Heart failure, unspecified: Secondary | ICD-10-CM | POA: Insufficient documentation

## 2017-01-14 DIAGNOSIS — E119 Type 2 diabetes mellitus without complications: Secondary | ICD-10-CM | POA: Diagnosis not present

## 2017-01-14 DIAGNOSIS — K219 Gastro-esophageal reflux disease without esophagitis: Secondary | ICD-10-CM | POA: Insufficient documentation

## 2017-01-14 DIAGNOSIS — R1084 Generalized abdominal pain: Secondary | ICD-10-CM | POA: Diagnosis not present

## 2017-01-14 DIAGNOSIS — Z951 Presence of aortocoronary bypass graft: Secondary | ICD-10-CM | POA: Diagnosis not present

## 2017-01-14 DIAGNOSIS — M797 Fibromyalgia: Secondary | ICD-10-CM | POA: Insufficient documentation

## 2017-01-14 DIAGNOSIS — Z9079 Acquired absence of other genital organ(s): Secondary | ICD-10-CM | POA: Insufficient documentation

## 2017-01-14 DIAGNOSIS — G4733 Obstructive sleep apnea (adult) (pediatric): Secondary | ICD-10-CM | POA: Diagnosis not present

## 2017-01-14 DIAGNOSIS — Z90722 Acquired absence of ovaries, bilateral: Secondary | ICD-10-CM | POA: Diagnosis not present

## 2017-01-14 DIAGNOSIS — E785 Hyperlipidemia, unspecified: Secondary | ICD-10-CM | POA: Diagnosis not present

## 2017-01-14 DIAGNOSIS — Z9049 Acquired absence of other specified parts of digestive tract: Secondary | ICD-10-CM | POA: Diagnosis not present

## 2017-01-14 DIAGNOSIS — I252 Old myocardial infarction: Secondary | ICD-10-CM | POA: Diagnosis not present

## 2017-01-14 NOTE — Progress Notes (Signed)
Follow Up Note: Gyn-Onc  Diane [name] Diane [name] 61 y.o. female  CC:  Chief Complaint  Patient presents with  . Abdominal Pain  . intermittent constipation   Assessment/Plan:  61 year old female with a history of a benign ovarian mass, s/p exploratory laparotomy with bilateral salpingo-oophorectomy, omentectomy sigmoid colectomy with reanastamosis on 05/19/15.  Recovery was complicated by wound cellulitis and drainage and superficial dehiscence.   She is now having migrating generalized abdominal pain and intermittent constipation.  She has a ventral incisional hernia, which may or may not be responsible for some of her symptoms. I am ordering a CT abdo/pelvis to rule out occult intraperitoneal processes. It will also aid in evaluating her hernia.  I discussed that I do not treat hernias, but I will refer her to Dr Rosendo Gros from [facility] for evaluation and treatment. I explained that he will determine if surgery is necessary.   Her intermittent constipation is not related to her prior surgery with me. I recommend follow-up with her gastroenterologist.  She does not require ongoing followup with me due to her history of benign pathologies. I informed the patient that she still requires ongoing pap surveillance as she still has a cervix. I discussed that I do not perform routine well-woman care or screening paps as I am a cancer specialist.   She will return to see Dr Glori Bickers for facilitate and further work-up of her symptoms.  HPI:  Diane [name] is a 61 year old, gravida 0,  seen in consultation at the request of Dr. Charlesetta Garibaldi for a 25 cm mostly cystic pelvic mass arising from likely the left ovary. She had symptoms of intermittent nausea for the past month and noted distention of the abdomen. She reported this to her provider who scheduled her for a CT scan of the abdomen and pelvis with contrast, which was performed on 03/28/2015. It revealed mild left hydronephrosis associated with  obstruction from a 24 x 25 x 16 cm cystic mass in the central abdomen and pelvis with multiple internal septations. The mass was asymmetric towards the left anterior abdominal wall. There was no ascites. There was no lymphadenopathy. There is no omental cake or carcinomatosis.  Follow-up ultrasound scan was performed by Dr. Charlesetta Garibaldi on 03/30/2015, which revealed a small normal uterus measuring 5.4 x 4.1 x 2.9 cm with a thin endometrial stripe of 1.3 mm. A large cystic mass was also confirmed arising from the pelvis.  Medical history/issues include MI in 2007, CABG, coronary stent, tobacco abuse, cerebrovascular disease, obstructive sleep apnea, diabetes, and substantial surgical history.  She was seen by Dr. Peter Martinique prior to surgery with no interventions recommended to improve cardiac status and with the patient being viewed as high surgical risk.  She recently had a breast biopsy on 05/17/15 that resulted benign breast tissue with calcifications.  On 05/19/15, she underwent an exploratory laparotomy with bilateral salpingo-oophorectomy, omentectomy sigmoid colectomy with reanastamosis by Dr. Everitt Amber.  Final pathology revealed: 1. Ovary and fallopian tube, left - MUCINOUS CYSTADENOMA, MULTILOCULAR. - NO BORDERLINE CHANGE OR MALIGNANCY. - BENIGN FALLOPIAN TUBE.  2. Ovary and fallopian tube, right - UNREMARKABLE OVARY AND FALLOPIAN TUBE. - NO ENDOMETRIOSIS OR EVIDENCE OF MALIGNANCY. 3. Omentum, resection for tumor - BENIGN ADIPOSE TISSUE CONSISTENT WITH OMENTUM.  4. Colon, segmental resection, sigmoid - BENIGN COLON, CLINICALLY SIGMOID. - NO EVIDENCE OF MALIGNANCY.  Her post-operative course was complicated by superficial wound dehiscence requiring packing.   Interval History:    The patient was  last seen by me in October, 2016. She was discharged from our practice as she had healed from her surgery and had no malignancy identified on pathology.  She has developed intermittent generalized abdominal  pains. They are predominantly right upper and lower quadrants. She also reports intermittent constipation which is bothersome.  She had seen her PCP, Dr Glori Bickers, for this who sent the patient to see me.   Review of Systems  Constitutional: Feels well.  No fever, chills, early satiety, unintentional weight loss or gain.  Cardiovascular: No chest pain, shortness of breath, or edema.  Pulmonary: No cough or wheeze.  Gastrointestinal: No nausea, vomiting, or diarrhea. No bright red blood per rectum or change in bowel movement. + abdominal pains, + constipation. Genitourinary: No frequency, urgency, or dysuria. No vaginal bleeding or discharge.  Musculoskeletal: No myalgia or joint pain. Neurologic: No weakness, numbness, or change in gait.  Psychology: No depression, anxiety, or insomnia.  Current Meds:  Outpatient Encounter Prescriptions as of 01/14/2017  Medication Sig  . aspirin EC 81 MG tablet Take 1 tablet (81 mg total) by mouth daily.  . carvedilol (COREG) 25 MG tablet Take 1 tablet (25 mg total) by mouth 2 (two) times daily with a meal.  . clopidogrel (PLAVIX) 75 MG tablet Take 1 tablet (75 mg total) by mouth daily. (Patient taking differently: Take 75 mg by mouth. )  . cyclobenzaprine (FLEXERIL) 10 MG tablet Take 1 tablet (10 mg total) by mouth at bedtime.  [name] dexlansoprazole (DEXILANT) 60 MG capsule TAKE 1 CAPSULE (60 MG TOTAL) BY MOUTH DAILY.  . diphenhydrAMINE (BENADRYL) 25 mg capsule Take 2 capsules in AM and 1 capsule at night  . fluticasone (FLONASE) 50 MCG/ACT nasal spray Place 2 sprays into both nostrils daily.  . furosemide (LASIX) 40 MG tablet Take 1 tablet (40 mg total) by mouth daily.  [name] gabapentin (NEURONTIN) 300 MG capsule Take 1 capsule (300 mg total) by mouth 3 (three) times daily.  [name] glimepiride (AMARYL) 2 MG tablet Take 2 mg by mouth 2 (two) times daily.   . Glycopyrrolate-Formoterol (BEVESPI AEROSPHERE) 9-4.8 MCG/ACT AERO Inhale 2 puffs into the lungs 2 (two) times daily.   [name] guaiFENesin (MUCINEX) 600 MG 12 hr tablet Take 2 tablets (1,200 mg total) by mouth 2 (two) times daily.  . hydrochlorothiazide (HYDRODIURIL) 50 MG tablet Take 1 tablet (50 mg total) by mouth daily.  . isosorbide mononitrate (IMDUR) 30 MG 24 hr tablet Take 1 tablet (30 mg total) by mouth daily. (Patient taking differently: Take 30 mg by mouth 2 (two) times daily. )  . levETIRAcetam (KEPPRA) 750 MG tablet TAKE 1 TABLET (750 MG TOTAL) BY MOUTH 2 (TWO) TIMES DAILY.  [name] losartan (COZAAR) 100 MG tablet Take 1 tablet (100 mg total) by mouth daily.  . metFORMIN (GLUCOPHAGE-XR) 500 MG 24 hr tablet Take 1,000 mg by mouth 2 (two) times daily.  . nicotine (NICODERM CQ - DOSED IN MG/24 HOURS) 21 mg/24hr patch Place 21 mg onto the skin daily.  . nitroGLYCERIN (NITROLINGUAL) 0.4 MG/SPRAY spray PLACE 1 SPRAY UNDER THE TONGUE EVERY 5 (FIVE) MINUTES X 3 DOSES AS NEEDED FOR CHEST PAIN.  . ONE TOUCH ULTRA TEST test strip USE AS DIRECTED FOR TESTING BLOOD GLUCOSE 3 TIMES DAILY  . phenytoin (DILANTIN) 100 MG ER capsule Take 1 capsule (100 mg total) by mouth at bedtime.  . potassium chloride (KLOR-CON M10) 10 MEQ tablet Take 1 tablet (10 mEq total) by mouth 2 (two) times daily.  . ranolazine (  RANEXA) 1000 MG SR tablet Take 1 tablet (1,000 mg total) by mouth 2 (two) times daily.  . rosuvastatin (CRESTOR) 10 MG tablet TAKE 1 TABLET BY MOUTH DAILY  . VENTOLIN HFA 108 (90 Base) MCG/ACT inhaler Inhale 1 puff into the lungs as needed.  . VENTOLIN HFA 108 (90 Base) MCG/ACT inhaler INHALE 2 PUFFS EVERY 6 HOURS AS NEEDED FOR WHEEZING/SHORTNESS OF BREATH  . vitamin B-12 (CYANOCOBALAMIN) 1000 MCG tablet Take 1,000 mcg by mouth daily.   No facility-administered encounter medications on file as of 01/14/2017.     Allergy:  Allergies  Allergen Reactions  . Bee Venom Itching and Swelling  . Entresto [Sacubitril-Valsartan] Cough  . Tetracycline     REACTION: reaction not known  . Ace Inhibitors Cough    Social Hx:    Social History   Social History  . Marital status: Divorced    Spouse name: N/A  . Number of children: N/A  . Years of education: N/A   Occupational History  . Not on file.   Social History Main Topics  . Smoking status: Current Every Day Smoker    Packs/day: 1.50    Years: 50.00    Types: Cigarettes  . Smokeless tobacco: Never Used     Comment: 1.5 pack to 2 packs per day  . Alcohol use No  . Drug use: No  . Sexual activity: Not Currently    Partners: Male    Birth control/ protection: None   Other Topics Concern  . Not on file   Social History Narrative  . No narrative on file    Past Surgical Hx:  Past Surgical History:  Procedure Laterality Date  . APPENDECTOMY    . CARDIAC CATHETERIZATION  11/29/05   EF of 55%  . CARDIAC CATHETERIZATION  08/06/06   EF of 45-50%  . CARDIAC CATHETERIZATION N/A 05/06/2015   Procedure: Right/Left Heart Cath and Coronary/Graft Angiography;  Surgeon: Peter M Martinique, MD;  Location: [place] [facility];  Service: Cardiovascular;  Laterality: N/A;  . CERVICAL FUSION  1990  . CHOLECYSTECTOMY    . CORONARY ARTERY BYPASS GRAFT  12/04/2005   x5 -- left internal mammary artery to the LAD, left radial artery to the ramus intermedius, saphenous vein graft to the obtuse marginal 1, sequential saphenous vein grat to the acute marginal and posterior descending, endoscopic vein harvesting from the left thigh with open vein harvest from right leg  . CORONARY STENT PLACEMENT  08/11/06   PCI of her ciurcumflex/OM vessel  . LAPAROTOMY Bilateral 05/19/2015   Procedure: EXPLORATORY LAPAROTOMY WITH BILATERAL SALPINGO OOPHORECTOMY /OMENTECTOMY/SEGMENTAL SIGMOID COLECTOMY ;  Surgeon: Everitt Amber, MD;  Location: [facility];  Service: Gynecology;  Laterality: Bilateral;  . PERIPHERAL VASCULAR CATHETERIZATION N/A 11/15/2015   Procedure: Carotid PTA/Stent Intervention;  Surgeon: Lorretta Harp, MD;  Location: [place] [facility];  Service: Cardiovascular;  Laterality: N/A;   . PERIPHERAL VASCULAR CATHETERIZATION  11/15/2015   Procedure: Carotid Angiography;  Surgeon: Lorretta Harp, MD;  Location: [place] [facility];  Service: Cardiovascular;;    Past Medical Hx:  Past Medical History:  Diagnosis Date  . Anemia   . Carotid stenosis   . CHF (congestive heart failure) ([place])   . Chronic bronchitis ([place])   . Coronary artery disease    post CABG in 3/07   . CVA (cerebral infarction) 1993  . Diabetes mellitus    type 2  . Dyslipidemia   . Fibromyalgia   . GERD (gastroesophageal reflux  disease)   . Goiter   . Headache    hx of   . Hyperlipidemia   . Hypertension   . Myocardial infarction ([place])   . Pneumonia    hx of   . PONV (postoperative nausea and vomiting)    only once was patient sick in the 1980s   . Seizures ([place])    last seizure- 03/2013   . Shortness of breath dyspnea    with exertion or when fluid builds up   . Sleep apnea    used to wear a cpap- not used in 3 years   . Stroke ([place]) 1993  . Systolic murmur    known mild AS and MR  . Tobacco abuse     Family Hx:  Family History  Problem Relation Age of Onset  . Heart disease Mother   . Diabetes Mother   . COPD Mother   . Hyperlipidemia Mother   . Hypertension Mother   . Cancer Father   . Drug abuse Paternal Grandmother   . Stroke Paternal Grandfather   . Coronary artery disease    . Heart disease    . Cancer Paternal Aunt     lung with mets to brain  . Cancer Paternal Uncle     skin cancer  . Cancer Paternal Uncle     lung/liver to brain  . Cancer Paternal Uncle     cancer of unknown type  . Hypertension Sister   . Cancer Sister   . Glaucoma Sister     Vitals:  Blood pressure 137/70, pulse 74, temperature 98.1 F (36.7 C), temperature source Oral, resp. rate 18, weight 144 lb 14.4 oz (65.7 kg), last menstrual period 09/02/2003.  Physical Exam:  General: Well developed, well nourished female in no acute distress. Alert and oriented x 3.  Cardiovascular:  deferred Lungs: deferred Skin: No rashes or lesions present. Back: deferred Abdomen: Abdomen soft, non-tender and nonobese. Non distended. She has a palpable ventral/incisional hernia at the superior aspect of her paramedian incision on the right. It is soft and reducible. Measures approximately 10cm.   Pelvic exam: normal appearing small cervix, palpably normal uterus, no palpable pelvic masses (ovaries surgically absent).  Extremities: No bilateral cyanosis, edema, or clubbing.     Donaciano Eva, MD  01/14/2017, 1:29 PM

## 2017-01-22 ENCOUNTER — Telehealth: Payer: Self-pay | Admitting: *Deleted

## 2017-01-22 ENCOUNTER — Ambulatory Visit: Payer: Medicare Other | Admitting: Cardiovascular Disease

## 2017-01-22 NOTE — Telephone Encounter (Signed)
I have called central scheduling and scheduled her CT scan for May 1st at 8:30am. Patient instructions are to arrive at 8:15am,  NPO after midnight, drink first bottle of contrast at 6:30am and the second bottle at 7:30am.   I have attempted to call the patient, I left a message for her to call the office back.

## 2017-01-23 ENCOUNTER — Telehealth: Payer: Self-pay | Admitting: *Deleted

## 2017-01-23 NOTE — Telephone Encounter (Signed)
I have called and left the patient a message to call the office back regarding her scan set up.

## 2017-01-24 ENCOUNTER — Telehealth: Payer: Self-pay | Admitting: *Deleted

## 2017-01-24 ENCOUNTER — Other Ambulatory Visit: Payer: Self-pay

## 2017-01-24 DIAGNOSIS — R109 Unspecified abdominal pain: Secondary | ICD-10-CM

## 2017-01-24 NOTE — Telephone Encounter (Signed)
Patient called back and left a message that " I out of town until Monday. I can't do a scan next week. I will call the office on Monday, or you can call me." Scan cancelled.

## 2017-01-24 NOTE — Telephone Encounter (Signed)
I have attempted to contact the patient, left message to call the office back regarding her scan that is scheduled. I have contacted her sister, and she will try to reach the patient by Monday.

## 2017-01-29 ENCOUNTER — Ambulatory Visit (HOSPITAL_COMMUNITY): Admission: RE | Admit: 2017-01-29 | Payer: Medicare Other | Source: Ambulatory Visit

## 2017-01-30 ENCOUNTER — Encounter: Payer: Self-pay | Admitting: Cardiovascular Disease

## 2017-01-30 ENCOUNTER — Ambulatory Visit (INDEPENDENT_AMBULATORY_CARE_PROVIDER_SITE_OTHER): Payer: Medicare Other | Admitting: Cardiovascular Disease

## 2017-01-30 VITALS — BP 148/70 | HR 80 | Ht <= 58 in | Wt 146.8 lb

## 2017-01-30 DIAGNOSIS — I25708 Atherosclerosis of coronary artery bypass graft(s), unspecified, with other forms of angina pectoris: Secondary | ICD-10-CM | POA: Diagnosis not present

## 2017-01-30 DIAGNOSIS — I739 Peripheral vascular disease, unspecified: Secondary | ICD-10-CM

## 2017-01-30 NOTE — Progress Notes (Addendum)
01/30/2017 Diane [name]   September 08, 1956  [id number]  Primary Physician Loura Pardon, MD Primary Cardiologist: Lorretta Harp MD Garret Reddish, [place], [place]  HPI:  Diane [name] is a 61 year old Caucasian female with a history of CAD status post coronary artery bypass grafting March 2007 by Dr. [name]. She's had subsequent stenting and known occluded grafts except for her her LIMA. She has severe LV dysfunction with moderate pulmonary hypertension. I last saw her in the office 06/20/16. Her mother, Diane [name], was a long-term patient of mine as well. Her other Problems include diabetes, hypokalemia, hypertension and ongoing tobacco abuse. She's had progression of her bilateral carotid artery stenosis left greater than right . She is neurologically asymptomatic. She is high risk for carotid endarterectomy. I performed cerebral angiography and her 11/15/15 revealing 70% bilateral ICA stenosis with pins beyond the stenosis and significant calcification on the left making  stenting not appropriate. Her carotid Dopplers performed on 06/26/16 showed stable bilateral moderate ICA stenosis and lower extremity Dopplers performed 12/12/16 revealed ABIs 0.93 bilaterally with moderate but not severe disease. She does complain of some leg pain which has some atypical characteristics for claudication.  Current Outpatient Prescriptions  Medication Sig Dispense Refill  . aspirin EC 81 MG tablet Take 1 tablet (81 mg total) by mouth daily. 30 tablet 6  . carvedilol (COREG) 25 MG tablet Take 1 tablet (25 mg total) by mouth 2 (two) times daily with a meal. 180 tablet 3  . clopidogrel (PLAVIX) 75 MG tablet Take 1 tablet (75 mg total) by mouth daily. (Patient taking differently: Take 75 mg by mouth. ) 90 tablet 3  . cyclobenzaprine (FLEXERIL) 10 MG tablet Take 1 tablet (10 mg total) by mouth at bedtime. 90 tablet 1  . dexlansoprazole (DEXILANT) 60 MG capsule TAKE 1 CAPSULE (60 MG TOTAL) BY MOUTH DAILY. 30 capsule 11  .  diphenhydrAMINE (BENADRYL) 25 mg capsule Take 2 capsules in AM and 1 capsule at night 270 capsule 3  . fluticasone (FLONASE) 50 MCG/ACT nasal spray Place 2 sprays into both nostrils daily. 16 g 2  . furosemide (LASIX) 40 MG tablet Take 1 tablet (40 mg total) by mouth daily. 90 tablet 3  . gabapentin (NEURONTIN) 300 MG capsule Take 1 capsule (300 mg total) by mouth 3 (three) times daily. 270 capsule 3  . glimepiride (AMARYL) 2 MG tablet Take 2 mg by mouth 2 (two) times daily.     . Glycopyrrolate-Formoterol (BEVESPI AEROSPHERE) 9-4.8 MCG/ACT AERO Inhale 2 puffs into the lungs 2 (two) times daily. 1 Inhaler 5  . guaiFENesin (MUCINEX) 600 MG 12 hr tablet Take 2 tablets (1,200 mg total) by mouth 2 (two) times daily. 360 tablet 3  . hydrochlorothiazide (HYDRODIURIL) 50 MG tablet Take 1 tablet (50 mg total) by mouth daily. 90 tablet 3  . isosorbide mononitrate (IMDUR) 30 MG 24 hr tablet Take 1 tablet (30 mg total) by mouth daily. (Patient taking differently: Take 30 mg by mouth 2 (two) times daily. ) 90 tablet 3  . levETIRAcetam (KEPPRA) 750 MG tablet TAKE 1 TABLET (750 MG TOTAL) BY MOUTH 2 (TWO) TIMES DAILY. 180 tablet 3  . losartan (COZAAR) 100 MG tablet Take 1 tablet (100 mg total) by mouth daily. 90 tablet 3  . metFORMIN (GLUCOPHAGE-XR) 500 MG 24 hr tablet Take 1,000 mg by mouth 2 (two) times daily.  6  . nicotine (NICODERM CQ - DOSED IN MG/24 HOURS) 21 mg/24hr patch Place 21 mg onto the skin  daily.    . nitroGLYCERIN (NITROLINGUAL) 0.4 MG/SPRAY spray PLACE 1 SPRAY UNDER THE TONGUE EVERY 5 (FIVE) MINUTES X 3 DOSES AS NEEDED FOR CHEST PAIN. 0.4 g 2  . ONE TOUCH ULTRA TEST test strip USE AS DIRECTED FOR TESTING BLOOD GLUCOSE 3 TIMES DAILY  1  . phenytoin (DILANTIN) 100 MG ER capsule Take 1 capsule (100 mg total) by mouth at bedtime. 90 capsule 3  . potassium chloride (KLOR-CON M10) 10 MEQ tablet Take 1 tablet (10 mEq total) by mouth 2 (two) times daily. 180 tablet 3  . ranolazine (RANEXA) 1000 MG SR  tablet Take 1 tablet (1,000 mg total) by mouth 2 (two) times daily. 180 tablet 3  . rosuvastatin (CRESTOR) 10 MG tablet TAKE 1 TABLET BY MOUTH DAILY 90 tablet 3  . VENTOLIN HFA 108 (90 Base) MCG/ACT inhaler INHALE 2 PUFFS EVERY 6 HOURS AS NEEDED FOR WHEEZING/SHORTNESS OF BREATH 18 Inhaler 5  . vitamin B-12 (CYANOCOBALAMIN) 1000 MCG tablet Take 1,000 mcg by mouth daily.     No current facility-administered medications for this visit.     Allergies  Allergen Reactions  . Bee Venom Itching and Swelling  . Entresto [Sacubitril-Valsartan] Cough  . Tetracycline     REACTION: reaction not known  . Ace Inhibitors Cough    Social History   Social History  . Marital status: Divorced    Spouse name: N/A  . Number of children: N/A  . Years of education: N/A   Occupational History  . Not on file.   Social History Main Topics  . Smoking status: Current Every Day Smoker    Packs/day: 1.50    Years: 50.00    Types: Cigarettes  . Smokeless tobacco: Never Used     Comment: 1.5 pack to 2 packs per day  . Alcohol use No  . Drug use: No  . Sexual activity: Not Currently    Partners: Male    Birth control/ protection: None   Other Topics Concern  . Not on file   Social History Narrative  . No narrative on file     Review of Systems: General: negative for chills, fever, night sweats or weight changes.  Cardiovascular: negative for chest pain, dyspnea on exertion, edema, orthopnea, palpitations, paroxysmal nocturnal dyspnea or shortness of breath Dermatological: negative for rash Respiratory: negative for cough or wheezing Urologic: negative for hematuria Abdominal: negative for nausea, vomiting, diarrhea, bright red blood per rectum, melena, or hematemesis Neurologic: negative for visual changes, syncope, or dizziness All other systems reviewed and are otherwise negative except as noted above.    Blood pressure (!) 148/70, pulse 80, height '4\' 10"'$  (1.473 m), weight 146 lb 12.8 oz  (66.6 kg), last menstrual period 09/02/2003.  General appearance: alert and no distress Neck: no adenopathy, no carotid bruit, no JVD, supple, symmetrical, trachea midline and thyroid not enlarged, symmetric, no tenderness/mass/nodules Lungs: clear to auscultation bilaterally Heart: regular rate and rhythm, S1, S2 normal, no murmur, click, rub or gallop Extremities: extremities normal, atraumatic, no cyanosis or edema  EKG not performed today  ASSESSMENT AND PLAN:   Cerebral artery occlusion with cerebral infarction [facility]) History of moderate bilateral calcified ICA stenosis demonstrated by angiography performed by myself 11/15/15. I thought her right carotid could be stented although it was not tight enough to do so. Her left carotid probably high risk for stent given the tortuosity . Recent carotid Dopplers performed 06/26/16 showed moderate bilateral ICA stenosis that had remained stable. We are following this  on an annual basis.  Claudication in peripheral vascular disease ([place]) History does complain of some mild claudication although there are some atypical characteristics including the fact that there is pain at rest. She did have lower extremity arterial Doppler studies performed 12/12/16 revealing ABIs of 0.93 bilaterally moderate common femoral and SFA disease on the right and mild on the left. This point, I do not think her Dopplers suggestive of high-grade disease and would not recommend an invasive approach rather serial annual Doppler studies and clinical follow-up.      Lorretta Harp MD FACP,FACC,FAHA, [facility] 01/30/2017 3:31 PM

## 2017-01-30 NOTE — Patient Instructions (Signed)
Medication Instructions: Your physician recommends that you continue on your current medications as directed. Please refer to the Current Medication list given to you today.   Testing/Procedures: Schedule Carotid dopplers for September 2018  Your physician has requested that you have a lower extremity arterial duplex in March 2019. During this test, ultrasound is used to evaluate arterial blood flow in the legs. Allow one hour for this exam. There are no restrictions or special instructions.  Your physician has requested that you have an ankle brachial index (ABI) in March 2019. During this test an ultrasound and blood pressure cuff are used to evaluate the arteries that supply the arms and legs with blood. Allow thirty minutes for this exam. There are no restrictions or special instructions.   Follow-Up: Your physician wants you to follow-up in: 1 year with Dr. Gwenlyn Found. You will receive a reminder letter in the mail two months in advance. If you don't receive a letter, please call our office to schedule the follow-up appointment.  If you need a refill on your cardiac medications before your next appointment, please call your pharmacy.

## 2017-01-30 NOTE — Telephone Encounter (Signed)
Left pt message asking to call Allison back directly at [phone] to schedule AWV.+ labs with Lesia and CPE with PCP. °

## 2017-01-30 NOTE — Assessment & Plan Note (Signed)
History of moderate bilateral calcified ICA stenosis demonstrated by angiography performed by myself 11/15/15. I thought her right carotid could be stented although it was not tight enough to do so. Her left carotid probably high risk for stent given the tortuosity . Recent carotid Dopplers performed 06/26/16 showed moderate bilateral ICA stenosis that had remained stable. We are following this on an annual basis.

## 2017-01-30 NOTE — Assessment & Plan Note (Signed)
History does complain of some mild claudication although there are some atypical characteristics including the fact that there is pain at rest. She did have lower extremity arterial Doppler studies performed 12/12/16 revealing ABIs of 0.93 bilaterally moderate common femoral and SFA disease on the right and mild on the left. This point, I do not think her Dopplers suggestive of high-grade disease and would not recommend an invasive approach rather serial annual Doppler studies and clinical follow-up.

## 2017-02-04 ENCOUNTER — Encounter: Payer: Self-pay | Admitting: Cardiology

## 2017-02-06 ENCOUNTER — Telehealth: Payer: Self-pay | Admitting: *Deleted

## 2017-02-06 NOTE — Telephone Encounter (Signed)
I have called and left a message for the patient to call the office back. Patient needs to be scheduled for her scans.

## 2017-02-07 ENCOUNTER — Telehealth: Payer: Self-pay | Admitting: *Deleted

## 2017-02-07 NOTE — Telephone Encounter (Signed)
I have attempted to contact the patient, I have left a message for the patient to call the office back. Patient needs to be set up for a CT scan.

## 2017-02-08 ENCOUNTER — Telehealth: Payer: Self-pay | Admitting: *Deleted

## 2017-02-08 NOTE — Telephone Encounter (Signed)
I have called and left the patient a message regarding trying to schedule her scan. Ask the patient to call the office back.

## 2017-02-14 ENCOUNTER — Other Ambulatory Visit: Payer: Self-pay

## 2017-02-14 DIAGNOSIS — R109 Unspecified abdominal pain: Secondary | ICD-10-CM

## 2017-02-15 ENCOUNTER — Other Ambulatory Visit (HOSPITAL_BASED_OUTPATIENT_CLINIC_OR_DEPARTMENT_OTHER): Payer: Medicare Other

## 2017-02-15 DIAGNOSIS — R109 Unspecified abdominal pain: Secondary | ICD-10-CM | POA: Diagnosis present

## 2017-02-15 LAB — COMPREHENSIVE METABOLIC PANEL
ALT: 13 U/L (ref 0–55)
AST: 9 U/L (ref 5–34)
Albumin: 3.8 g/dL (ref 3.5–5.0)
Alkaline Phosphatase: 75 U/L (ref 40–150)
Anion Gap: 11 mEq/L (ref 3–11)
BUN: 10.8 mg/dL (ref 7.0–26.0)
CO2: 26 mEq/L (ref 22–29)
Calcium: 9.3 mg/dL (ref 8.4–10.4)
Chloride: 99 mEq/L (ref 98–109)
Creatinine: 0.7 mg/dL (ref 0.6–1.1)
EGFR: >90 mL/min/{1.73_m2} (ref >90)
Glucose: 98 mg/dl (ref 70–140)
Potassium: 4.8 mEq/L (ref 3.5–5.1)
Sodium: 135 mEq/L — ABNORMAL LOW (ref 136–145)
Total Bilirubin: 0.28 mg/dL (ref 0.20–1.20)
Total Protein: 6.8 g/dL (ref 6.4–8.3)

## 2017-02-17 LAB — HM DIABETES EYE EXAM

## 2017-02-17 NOTE — Progress Notes (Signed)
Diane [name] Date of Birth: 1956/01/08 Medical Record #[id number]  History of Present Illness: Diane [name] is seen for follow up CAD. She has a history of coronary disease and is status post CABG in March of 2007 by Dr. [name]. She presented later that year with recurrent angina. Repeat cardiac cath showed patent LIMA to the LAD but all other grafts occluded including SVG to OM, SVG to AC/PL, and radial graft to ramus intermediate. She then had complex stenting of the mid LCx and first OM with Taxus stents. The native RCA was occluded with collaterals.   In 2016 she was evaluated for abdominal surgery. Myoview abnormal. Cardiac cath as noted above but now stents in LCx and OM occluded. No suitable targets for PCI or surgery. Severe LV dysfunction with moderate pulmonary HTN and normal LV filling pressures. She has multiple cardiac risk factors including diabetes, dyslipidemia, hypertension, and tobacco abuse. She also has  bilateral carotid arterial disease. She continues to smoke.   In August 2016 she had removal of a large pelvic mass with BSO and sigmoid colectomy. Path c/w ovarian cystadenoma. No complications.   She is followed by Dr. Gwenlyn Found for her carotid disease. Angiogram performed with findings of 70% bilateral stenoses. The LICA was heavily calcified. Both arteries had acute angulation after the stenosis making it less favorable for stenting. In March she developed dizziness, lightheadedness, and imbalance. Dr. Glori Bickers ordered an MRI that showed an old right MCA infarct and chronic lacunar infarcts that were new from 2014.   On a prior visit with me we switched her losartan to [facility]. She developed a persistent cough with clear phlegm. [name] was stopped and losartan resumed- cough resolved.  CXR showed no active disease.   She previously complained of pain in her thighs bilaterally. LE dopplers shows good ABIs of 0.93 bilaterally. Mild right iliac stenosis. Moderate right femoral and  SFA disease. Seen by Dr. Gwenlyn Found and medical therapy recommended.   On follow up today she is doing fairly well from a cardiac standpoint.  Denies any increased SOB or chest pain. No new neurologic symptoms. Weight has been stable and she has no edema. She has experienced some intermittent abdominal pain. She has intermittent constipation. Abdominal CT done yesterday shows an incisional hernia.    Current Outpatient Prescriptions on File Prior to Visit  Medication Sig Dispense Refill  . aspirin EC 81 MG tablet Take 1 tablet (81 mg total) by mouth daily. 30 tablet 6  . carvedilol (COREG) 25 MG tablet Take 1 tablet (25 mg total) by mouth 2 (two) times daily with a meal. 180 tablet 3  . clopidogrel (PLAVIX) 75 MG tablet Take 1 tablet (75 mg total) by mouth daily. (Patient taking differently: Take 75 mg by mouth. ) 90 tablet 3  . cyclobenzaprine (FLEXERIL) 10 MG tablet Take 1 tablet (10 mg total) by mouth at bedtime. 90 tablet 1  . dexlansoprazole (DEXILANT) 60 MG capsule TAKE 1 CAPSULE (60 MG TOTAL) BY MOUTH DAILY. 30 capsule 11  . diphenhydrAMINE (BENADRYL) 25 mg capsule Take 2 capsules in AM and 1 capsule at night 270 capsule 3  . fluticasone (FLONASE) 50 MCG/ACT nasal spray Place 2 sprays into both nostrils daily. 16 g 2  . furosemide (LASIX) 40 MG tablet Take 1 tablet (40 mg total) by mouth daily. 90 tablet 3  . gabapentin (NEURONTIN) 300 MG capsule Take 1 capsule (300 mg total) by mouth 3 (three) times daily. 270 capsule 3  . glimepiride (AMARYL)  2 MG tablet Take 2 mg by mouth 2 (two) times daily.     . Glycopyrrolate-Formoterol (BEVESPI AEROSPHERE) 9-4.8 MCG/ACT AERO Inhale 2 puffs into the lungs 2 (two) times daily. 1 Inhaler 5  . guaiFENesin (MUCINEX) 600 MG 12 hr tablet Take 2 tablets (1,200 mg total) by mouth 2 (two) times daily. 360 tablet 3  . hydrochlorothiazide (HYDRODIURIL) 50 MG tablet Take 1 tablet (50 mg total) by mouth daily. 90 tablet 3  . isosorbide mononitrate (IMDUR) 30 MG 24 hr  tablet Take 1 tablet (30 mg total) by mouth daily. (Patient taking differently: Take 30 mg by mouth 2 (two) times daily. ) 90 tablet 3  . levETIRAcetam (KEPPRA) 750 MG tablet TAKE 1 TABLET (750 MG TOTAL) BY MOUTH 2 (TWO) TIMES DAILY. 180 tablet 3  . losartan (COZAAR) 100 MG tablet Take 1 tablet (100 mg total) by mouth daily. 90 tablet 3  . metFORMIN (GLUCOPHAGE-XR) 500 MG 24 hr tablet Take 1,000 mg by mouth 2 (two) times daily.  6  . nicotine (NICODERM CQ - DOSED IN MG/24 HOURS) 21 mg/24hr patch Place 21 mg onto the skin daily.    . nitroGLYCERIN (NITROLINGUAL) 0.4 MG/SPRAY spray PLACE 1 SPRAY UNDER THE TONGUE EVERY 5 (FIVE) MINUTES X 3 DOSES AS NEEDED FOR CHEST PAIN. 0.4 g 2  . ONE TOUCH ULTRA TEST test strip USE AS DIRECTED FOR TESTING BLOOD GLUCOSE 3 TIMES DAILY  1  . phenytoin (DILANTIN) 100 MG ER capsule Take 1 capsule (100 mg total) by mouth at bedtime. 90 capsule 3  . potassium chloride (KLOR-CON M10) 10 MEQ tablet Take 1 tablet (10 mEq total) by mouth 2 (two) times daily. 180 tablet 3  . ranolazine (RANEXA) 1000 MG SR tablet Take 1 tablet (1,000 mg total) by mouth 2 (two) times daily. 180 tablet 3  . rosuvastatin (CRESTOR) 10 MG tablet TAKE 1 TABLET BY MOUTH DAILY 90 tablet 3  . VENTOLIN HFA 108 (90 Base) MCG/ACT inhaler INHALE 2 PUFFS EVERY 6 HOURS AS NEEDED FOR WHEEZING/SHORTNESS OF BREATH 18 Inhaler 5  . vitamin B-12 (CYANOCOBALAMIN) 1000 MCG tablet Take 1,000 mcg by mouth daily.     No current facility-administered medications on file prior to visit.     Allergies  Allergen Reactions  . Bee Venom Itching and Swelling  . Entresto [Sacubitril-Valsartan] Cough  . Tetracycline     REACTION: reaction not known  . Ace Inhibitors Cough    Past Medical History:  Diagnosis Date  . Anemia   . Carotid stenosis   . CHF (congestive heart failure) ([place])   . Chronic bronchitis ([place])   . Coronary artery disease    post CABG in 3/07   . CVA (cerebral infarction) 1993  . Diabetes  mellitus    type 2  . Dyslipidemia   . Fibromyalgia   . GERD (gastroesophageal reflux disease)   . Goiter   . Headache    hx of   . Hyperlipidemia   . Hypertension   . Myocardial infarction ([place])   . Pneumonia    hx of   . PONV (postoperative nausea and vomiting)    only once was patient sick in the 1980s   . Seizures ([place])    last seizure- 03/2013   . Shortness of breath dyspnea    with exertion or when fluid builds up   . Sleep apnea    used to wear a cpap- not used in 3 years   . Stroke ([place]) 1993  .  Systolic murmur    known mild AS and MR  . Tobacco abuse     Past Surgical History:  Procedure Laterality Date  . APPENDECTOMY    . CARDIAC CATHETERIZATION  11/29/05   EF of 55%  . CARDIAC CATHETERIZATION  08/06/06   EF of 45-50%  . CARDIAC CATHETERIZATION N/A 05/06/2015   Procedure: Right/Left Heart Cath and Coronary/Graft Angiography;  Surgeon: Jerik Falletta M Martinique, MD;  Location: [facility];  Service: Cardiovascular;  Laterality: N/A;  . CERVICAL FUSION  1990  . CHOLECYSTECTOMY    . CORONARY ARTERY BYPASS GRAFT  12/04/2005   x5 -- left internal mammary artery to the LAD, left radial artery to the ramus intermedius, saphenous vein graft to the obtuse marginal 1, sequential saphenous vein grat to the acute marginal and posterior descending, endoscopic vein harvesting from the left thigh with open vein harvest from right leg  . CORONARY STENT PLACEMENT  08/11/06   PCI of her ciurcumflex/OM vessel  . LAPAROTOMY Bilateral 05/19/2015   Procedure: EXPLORATORY LAPAROTOMY WITH BILATERAL SALPINGO OOPHORECTOMY /OMENTECTOMY/SEGMENTAL SIGMOID COLECTOMY ;  Surgeon: Everitt Amber, MD;  Location: [facility];  Service: Gynecology;  Laterality: Bilateral;  . PERIPHERAL VASCULAR CATHETERIZATION N/A 11/15/2015   Procedure: Carotid PTA/Stent Intervention;  Surgeon: Lorretta Harp, MD;  Location: [place] [facility];  Service: Cardiovascular;  Laterality: N/A;  . PERIPHERAL VASCULAR CATHETERIZATION   11/15/2015   Procedure: Carotid Angiography;  Surgeon: Lorretta Harp, MD;  Location: [facility];  Service: Cardiovascular;;    History  Smoking Status  . Current Every Day Smoker  . Packs/day: 1.50  . Years: 50.00  . Types: Cigarettes  Smokeless Tobacco  . Never Used    Comment: 1.5 pack to 2 packs per day    History  Alcohol Use No    Family History  Problem Relation Age of Onset  . Heart disease Mother   . Diabetes Mother   . COPD Mother   . Hyperlipidemia Mother   . Hypertension Mother   . Cancer Father   . Drug abuse Paternal Grandmother   . Stroke Paternal Grandfather   . Coronary artery disease Unknown   . Heart disease Unknown   . Cancer Paternal Aunt        lung with mets to brain  . Cancer Paternal Uncle        skin cancer  . Cancer Paternal Uncle        lung/liver to brain  . Cancer Paternal Uncle        cancer of unknown type  . Hypertension Sister   . Cancer Sister   . Glaucoma Sister     Review of Systems: As noted in history of present illness.  All other systems were reviewed and are negative.  Physical Exam: BP 108/60   Pulse 84   Ht '4\' 10"'$  (1.473 m)   Wt 143 lb 3.2 oz (65 kg)   LMP 09/02/2003   SpO2 96%   BMI 29.93 kg/m  She is a  white female in no acute distress. She is normocephalic, atraumatic. Pupils are equal round and reactive. Sclera clear. Oropharynx is clear. Neck reveals bilateral carotid bruits. There is no JVD. Lungs are clear. Cardiac exam reveals a grade 4-5/8 systolic murmur at the right upper sternal border. Abdomen soft and NT. No masses. She has no edema. Pedal pulses are 1+ and palpable. Neuro alert and oriented x 3. No focal findings.  LABORATORY DATA: Lab Results  Component Value Date   WBC 7.5 08/13/2016   HGB 15.8 (H) 08/13/2016   HCT 45.5 08/13/2016   PLT 240.0 08/13/2016   GLUCOSE 98 02/15/2017   CHOL 179 08/13/2016   TRIG 183.0 (H) 08/13/2016   HDL 65.20 08/13/2016   LDLDIRECT 98.0 06/09/2013    LDLCALC 77 08/13/2016   ALT 13 02/15/2017   AST 9 02/15/2017   NA 135 (L) 02/15/2017   K 4.8 02/15/2017   CL 99 08/13/2016   CREATININE 0.7 02/15/2017   BUN 10.8 02/15/2017   CO2 26 02/15/2017   TSH 1.35 08/13/2016   INR 1.05 11/10/2015   HGBA1C 8.0 (H) 06/09/2014   MICROALBUR 1.9 06/09/2013    Assessment / Plan: 1. CAD S/p CABG. Failed bypass grafts except for LIMA to LAD. S/p remote stenting of LCX and OM with Taxus DES now occluded. Stable class 2 angina. Continue aggressive antianginal Rx. With ASA, plavix, nitrates, coreg, and Ranexa. Once again encouraged  smoking cessation.  2. Carotid arterial disese- moderate, 70% bilateral. Not favorable for stenting. She is a poor surgical candidate. Carotid dopplers in September 2017 unchanged. Followed by Dr Gwenlyn Found. 3. Tobacco abuse. Recommend complete cessation.  4. HTN controlled. 5. Chronic combined systolic and diastolic CHF. Severe LV dysfunction. She appears to be well compensated today without edema. Weight stable.  Continue current medication. Sodium restriction.  6. Pulmonary HTN. 7. S/p major abdominal surgery for cystadenoma. Now with CT showing evidence of incisional hernia. Some concern for intermittent bowel obstruction. She is scheduled to see Dr. Rosendo Gros with general surgery. We discussed her surgical risk today. With her severe CAD, carotid disease and CHF she will always be at high risk for general anesthesia. I would favor conservative therapy if at all possible. She is in agreement.  8. Lacunar infarcts.  9. PAD with stable claudication.  I will follow up in 6 months.

## 2017-02-18 ENCOUNTER — Ambulatory Visit (HOSPITAL_COMMUNITY): Admission: RE | Admit: 2017-02-18 | Payer: Medicare Other | Source: Ambulatory Visit

## 2017-02-18 ENCOUNTER — Ambulatory Visit (HOSPITAL_COMMUNITY)
Admission: RE | Admit: 2017-02-18 | Discharge: 2017-02-18 | Disposition: A | Discharge status: Home / Self Care | Payer: Medicare Other | Source: Ambulatory Visit | Attending: Gynecologic Oncology | Admitting: Gynecologic Oncology

## 2017-02-18 DIAGNOSIS — K432 Incisional hernia without obstruction or gangrene: Secondary | ICD-10-CM | POA: Insufficient documentation

## 2017-02-18 DIAGNOSIS — R1084 Generalized abdominal pain: Secondary | ICD-10-CM

## 2017-02-18 DIAGNOSIS — I2510 Atherosclerotic heart disease of native coronary artery without angina pectoris: Secondary | ICD-10-CM | POA: Insufficient documentation

## 2017-02-18 DIAGNOSIS — K5730 Diverticulosis of large intestine without perforation or abscess without bleeding: Secondary | ICD-10-CM | POA: Diagnosis not present

## 2017-02-18 DIAGNOSIS — I714 Abdominal aortic aneurysm, without rupture: Secondary | ICD-10-CM | POA: Insufficient documentation

## 2017-02-18 DIAGNOSIS — I700 Atherosclerosis of aorta: Secondary | ICD-10-CM | POA: Diagnosis not present

## 2017-02-18 MED ORDER — IOPAMIDOL (ISOVUE-300) INJECTION 61%: 100.0000 mL | Freq: Once | INTRAVENOUS | Status: DC | PRN

## 2017-02-18 MED ORDER — IOPAMIDOL (ISOVUE-300) INJECTION 61%
INTRAVENOUS | Status: AC
  Administered 2017-02-18: 100 mL
  Filled 2017-02-18: qty 100

## 2017-02-19 ENCOUNTER — Encounter: Payer: Self-pay | Admitting: Cardiology

## 2017-02-19 ENCOUNTER — Telehealth: Payer: Self-pay

## 2017-02-19 ENCOUNTER — Ambulatory Visit (INDEPENDENT_AMBULATORY_CARE_PROVIDER_SITE_OTHER): Payer: Medicare Other | Admitting: Cardiology

## 2017-02-19 VITALS — BP 108/60 | HR 84 | Ht <= 58 in | Wt 143.2 lb

## 2017-02-19 DIAGNOSIS — I10 Essential (primary) hypertension: Secondary | ICD-10-CM | POA: Diagnosis not present

## 2017-02-19 DIAGNOSIS — I25708 Atherosclerosis of coronary artery bypass graft(s), unspecified, with other forms of angina pectoris: Secondary | ICD-10-CM | POA: Diagnosis not present

## 2017-02-19 DIAGNOSIS — F17200 Nicotine dependence, unspecified, uncomplicated: Secondary | ICD-10-CM | POA: Diagnosis not present

## 2017-02-19 DIAGNOSIS — I209 Angina pectoris, unspecified: Secondary | ICD-10-CM

## 2017-02-19 DIAGNOSIS — I739 Peripheral vascular disease, unspecified: Secondary | ICD-10-CM | POA: Diagnosis not present

## 2017-02-19 DIAGNOSIS — I5042 Chronic combined systolic (congestive) and diastolic (congestive) heart failure: Secondary | ICD-10-CM | POA: Diagnosis not present

## 2017-02-19 DIAGNOSIS — I779 Disorder of arteries and arterioles, unspecified: Secondary | ICD-10-CM | POA: Diagnosis not present

## 2017-02-19 NOTE — Patient Instructions (Signed)
Continue your current therapy  I will see you in 6 months.   

## 2017-02-19 NOTE — Telephone Encounter (Signed)
Faxed referral form to [facility] for New Patient  appointment with Dr. Rosendo Gros.

## 2017-02-19 NOTE — Telephone Encounter (Signed)
-----   Message from Everitt Amber, MD sent at 02/18/2017  6:18 PM EDT ----- Diane [name] does not have an intra-abdominal pathology to explain her symptoms. I believe we set her up to see Dr Rosendo Gros from [facility] regarding her hernia and he will evaluate if a) he thinks it is contributing to her symtpoms and b) if she is a good surgical candidate. Thanks Terrence Dupont

## 2017-02-19 NOTE — Telephone Encounter (Signed)
Told Diane [name] the results of the CT scan as noted below by Dr. Denman George. Told Pt about referral to Dr. Rosendo Gros at [facility] to evaluate hernia and possibility of surgery. Pt verbalized understanding.

## 2017-02-19 NOTE — Telephone Encounter (Signed)
L/M for patient to call back to Dr. Serita Grit ofice for results of CT.

## 2017-02-27 DIAGNOSIS — E7800 Pure hypercholesterolemia, unspecified: Secondary | ICD-10-CM | POA: Diagnosis not present

## 2017-02-27 DIAGNOSIS — E1121 Type 2 diabetes mellitus with diabetic nephropathy: Secondary | ICD-10-CM | POA: Diagnosis not present

## 2017-02-27 DIAGNOSIS — I10 Essential (primary) hypertension: Secondary | ICD-10-CM | POA: Diagnosis not present

## 2017-02-27 DIAGNOSIS — E041 Nontoxic single thyroid nodule: Secondary | ICD-10-CM | POA: Diagnosis not present

## 2017-02-27 DIAGNOSIS — E119 Type 2 diabetes mellitus without complications: Secondary | ICD-10-CM | POA: Diagnosis not present

## 2017-02-27 NOTE — Telephone Encounter (Signed)
Pt scheduled to see Dr. Sherrie Sport on 03-14-17.

## 2017-03-02 ENCOUNTER — Other Ambulatory Visit: Payer: Self-pay | Admitting: Cardiology

## 2017-03-02 ENCOUNTER — Other Ambulatory Visit: Payer: Self-pay | Admitting: Pulmonary Disease

## 2017-03-04 NOTE — Telephone Encounter (Signed)
REFILL 

## 2017-03-18 ENCOUNTER — Encounter: Payer: Self-pay | Admitting: Pulmonary Disease

## 2017-03-18 ENCOUNTER — Ambulatory Visit (INDEPENDENT_AMBULATORY_CARE_PROVIDER_SITE_OTHER): Payer: Medicare Other | Admitting: Pulmonary Disease

## 2017-03-18 VITALS — BP 116/62 | HR 77 | Ht <= 58 in | Wt 146.0 lb

## 2017-03-18 DIAGNOSIS — J9611 Chronic respiratory failure with hypoxia: Secondary | ICD-10-CM

## 2017-03-18 DIAGNOSIS — Z720 Tobacco use: Secondary | ICD-10-CM | POA: Diagnosis not present

## 2017-03-18 DIAGNOSIS — I25708 Atherosclerosis of coronary artery bypass graft(s), unspecified, with other forms of angina pectoris: Secondary | ICD-10-CM

## 2017-03-18 DIAGNOSIS — J411 Mucopurulent chronic bronchitis: Secondary | ICD-10-CM | POA: Diagnosis not present

## 2017-03-18 DIAGNOSIS — J449 Chronic obstructive pulmonary disease, unspecified: Secondary | ICD-10-CM | POA: Diagnosis not present

## 2017-03-18 NOTE — Patient Instructions (Signed)
Follow up in 6 months 

## 2017-03-18 NOTE — Progress Notes (Signed)
Current Outpatient Prescriptions on File Prior to Visit  Medication Sig  . aspirin EC 81 MG tablet Take 1 tablet (81 mg total) by mouth daily.  . carvedilol (COREG) 25 MG tablet Take 1 tablet (25 mg total) by mouth 2 (two) times daily with a meal.  . clopidogrel (PLAVIX) 75 MG tablet Take 1 tablet (75 mg total) by mouth daily. (Patient taking differently: Take 75 mg by mouth. )  . cyclobenzaprine (FLEXERIL) 10 MG tablet Take 1 tablet (10 mg total) by mouth at bedtime.  [name] dexlansoprazole (DEXILANT) 60 MG capsule TAKE 1 CAPSULE (60 MG TOTAL) BY MOUTH DAILY.  . diphenhydrAMINE (BENADRYL) 25 mg capsule Take 2 capsules in AM and 1 capsule at night  . fluticasone (FLONASE) 50 MCG/ACT nasal spray PLACE 2 SPRAYS INTO BOTH NOSTRILS DAILY.  . furosemide (LASIX) 40 MG tablet Take 1 tablet (40 mg total) by mouth daily.  [name] gabapentin (NEURONTIN) 300 MG capsule Take 1 capsule (300 mg total) by mouth 3 (three) times daily.  [name] glimepiride (AMARYL) 2 MG tablet Take 2 mg by mouth 2 (two) times daily. Takes 2 tablets in the am and then I tablet at night  . Glycopyrrolate-Formoterol (BEVESPI AEROSPHERE) 9-4.8 MCG/ACT AERO Inhale 2 puffs into the lungs 2 (two) times daily.  [name] guaiFENesin (MUCINEX) 600 MG 12 hr tablet Take 2 tablets (1,200 mg total) by mouth 2 (two) times daily.  . hydrochlorothiazide (HYDRODIURIL) 50 MG tablet Take 1 tablet (50 mg total) by mouth daily.  . isosorbide mononitrate (IMDUR) 30 MG 24 hr tablet Take 1 tablet (30 mg total) by mouth daily. (Patient taking differently: Take 30 mg by mouth 2 (two) times daily. )  . levETIRAcetam (KEPPRA) 750 MG tablet TAKE 1 TABLET (750 MG TOTAL) BY MOUTH 2 (TWO) TIMES DAILY.  [name] losartan (COZAAR) 100 MG tablet Take 1 tablet (100 mg total) by mouth daily.  . nicotine (NICODERM CQ - DOSED IN MG/24 HOURS) 21 mg/24hr patch Place 21 mg onto the skin daily.  . nitroGLYCERIN (NITROLINGUAL) 0.4 MG/SPRAY spray PLACE 1 SPRAY UNDER THE TONGUE EVERY 5 (FIVE) MINUTES X 3  DOSES AS NEEDED FOR CHEST PAIN.  . ONE TOUCH ULTRA TEST test strip USE AS DIRECTED FOR TESTING BLOOD GLUCOSE 3 TIMES DAILY  . phenytoin (DILANTIN) 100 MG ER capsule Take 1 capsule (100 mg total) by mouth at bedtime.  . potassium chloride (KLOR-CON M10) 10 MEQ tablet Take 1 tablet (10 mEq total) by mouth 2 (two) times daily.  . ranolazine (RANEXA) 1000 MG SR tablet Take 1 tablet (1,000 mg total) by mouth 2 (two) times daily.  . rosuvastatin (CRESTOR) 10 MG tablet TAKE 1 TABLET BY MOUTH DAILY  . VENTOLIN HFA 108 (90 Base) MCG/ACT inhaler INHALE 2 PUFFS EVERY 6 HOURS AS NEEDED FOR WHEEZING/SHORTNESS OF BREATH  . vitamin B-12 (CYANOCOBALAMIN) 1000 MCG tablet Take 1,000 mcg by mouth daily.   No current facility-administered medications on file prior to visit.      Chief Complaint  Patient presents with  . Follow-up    f/u OSA, O2 at night, nasal cannula doe snot stay on during the night, Bevespi is helping her with breathing     Sleep tests PSG 04/10/06>> RDI 105  CPAP 06/13/11 to 07/10/11>>Used on 20 of 28 nights with median 3 hrs 45 min.  Average AHI 5 with median CPAP 7 cm H2O and 95th percentile CPAP 10 cm H2O. PSG 05/15/16 >> AHI 1.9, SpO2 low 83%  Pulmonary tests PFT 05/12/12>>FEV1 1.83 (  98%), FEV1% 80, TLC 3.83 (98%), DLCO 61% (corrects for lung volumes), no BD response PFT 06/30/14 >> FEV1 1.80 (93%), FEV1% 86, TLC 4.35 (108%), DLCO 82%, no BD Spirometry 05/11/16 >> FEV1 1.74 (89%), FEV1% 76  ONO with RA 07/27/16 >> test time 6 hrs 25 min.  Mean SpO2 91.04%, low SpO2 68%.  Spent 18 min with SpO2 < 88%.  Cardiac tests Echo 04/25/15 >> EF 15%, grade 2 diastolic CHF, mod MR, PAS 45 mmHg  Past medical history Combined CHF, CAD s/p CABG, CVA, DM, HLD, HTN, Fibromyalgia, GERD, Goiter, Pneumonia, Seizures  Past surgical history, Family history, Social history, Allergies reviewed  Vital Signs BP 116/62 (BP Location: Left Arm, Cuff Size: Normal)   Pulse 77   Ht 4\' 10"  (1.473 m)   Wt  146 lb (66.2 kg)   LMP 09/02/2003   SpO2 93%   BMI 30.51 kg/m   History of Present Illness Diane [name] is a 61 y.o. female smoker with chronic bronchitis and sleep related hypoxia.  She is still smoking.  Working on quitting.  Has cough with clear sputum.  Not much wheeze.  Using oxygen at night.  Sometimes comes off.  Feels more tired and sleepy.  Has gained about 15 lbs.   Physical Exam  General - pleasant Eyes - pupils reactive ENT - no sinus tenderness, no oral exudate, no LAN Cardiac - regular, no murmur Chest - faint crackle on Rt that cleared with cough Abd - soft, non tender Ext - no edema Skin - no rashes Neuro - normal strength Psych - normal mood    Assessment/Plan  COPD with chronic bronchitis. - continue bevespi and prn albuterol  Chronic respiratory failure with hypoxia. - continue oxygen at night - monitor sleep pattern and reassess whether she needs repeat sleep study at next visit  Tobacco abuse. - reviewed options to assist with smoking cessation   Patient Instructions  Follow up in 6 months    [name], MD [facility] Pager:  [phone] 03/18/2017, 12:45 PM

## 2017-04-01 DIAGNOSIS — K432 Incisional hernia without obstruction or gangrene: Secondary | ICD-10-CM | POA: Diagnosis not present

## 2017-04-02 NOTE — Progress Notes (Signed)
Subjective:   Diane [name] is a 61 y.o. female who presents for Medicare Annual (Subsequent) preventive examination.  Review of Systems:  No ROS.  Medicare Wellness Visit. Additional risk factors are reflected in the social history.  Cardiac Risk Factors include: advanced age (>7men, >55 women);diabetes mellitus;dyslipidemia;hypertension;sedentary lifestyle     Objective:     Vitals: BP (!) 102/50   Pulse 60   Ht 4\' 10"  (1.473 m)   Wt 145 lb 8 oz (66 kg)   LMP 09/02/2003   SpO2 96%   BMI 30.41 kg/m   Body mass index is 30.41 kg/m.   Tobacco History  Smoking Status  . Current Every Day Smoker  . Packs/day: 1.50  . Years: 50.00  . Types: Cigarettes  Smokeless Tobacco  . Never Used    Comment: 1.5 pack to 2 packs per day     Ready to quit: Yes Counseling given: Yes   Past Medical History:  Diagnosis Date  . Anemia   . Carotid stenosis   . CHF (congestive heart failure) ([place])   . Chronic bronchitis ([place])   . Coronary artery disease    post CABG in 3/07   . CVA (cerebral infarction) 1993  . Diabetes mellitus    type 2  . Dyslipidemia   . Fibromyalgia   . GERD (gastroesophageal reflux disease)   . Goiter   . Headache    hx of   . Hyperlipidemia   . Hypertension   . Myocardial infarction ([place])   . Pneumonia    hx of   . PONV (postoperative nausea and vomiting)    only once was patient sick in the 1980s   . Seizures ([place])    last seizure- 03/2013   . Shortness of breath dyspnea    with exertion or when fluid builds up   . Sleep apnea    used to wear a cpap- not used in 3 years   . Stroke ([place]) 1993  . Systolic murmur    known mild AS and MR  . Tobacco abuse    Past Surgical History:  Procedure Laterality Date  . APPENDECTOMY    . CARDIAC CATHETERIZATION  11/29/05   EF of 55%  . CARDIAC CATHETERIZATION  08/06/06   EF of 45-50%  . CARDIAC CATHETERIZATION N/A 05/06/2015   Procedure: Right/Left Heart Cath and Coronary/Graft Angiography;  Surgeon:  Peter M Martinique, MD;  Location: [facility];  Service: Cardiovascular;  Laterality: N/A;  . CERVICAL FUSION  1990  . CHOLECYSTECTOMY    . CORONARY ARTERY BYPASS GRAFT  12/04/2005   x5 -- left internal mammary artery to the LAD, left radial artery to the ramus intermedius, saphenous vein graft to the obtuse marginal 1, sequential saphenous vein grat to the acute marginal and posterior descending, endoscopic vein harvesting from the left thigh with open vein harvest from right leg  . CORONARY STENT PLACEMENT  08/11/06   PCI of her ciurcumflex/OM vessel  . LAPAROTOMY Bilateral 05/19/2015   Procedure: EXPLORATORY LAPAROTOMY WITH BILATERAL SALPINGO OOPHORECTOMY /OMENTECTOMY/SEGMENTAL SIGMOID COLECTOMY ;  Surgeon: Everitt Amber, MD;  Location: [facility];  Service: Gynecology;  Laterality: Bilateral;  . PERIPHERAL VASCULAR CATHETERIZATION N/A 11/15/2015   Procedure: Carotid PTA/Stent Intervention;  Surgeon: Lorretta Harp, MD;  Location: [facility];  Service: Cardiovascular;  Laterality: N/A;  . PERIPHERAL VASCULAR CATHETERIZATION  11/15/2015   Procedure: Carotid Angiography;  Surgeon: Lorretta Harp, MD;  Location: [facility];  Service: Cardiovascular;;   Family History  Problem Relation Age of Onset  . Heart disease Mother   . Diabetes Mother   . COPD Mother   . Hyperlipidemia Mother   . Hypertension Mother   . Cancer Father   . Drug abuse Paternal Grandmother   . Stroke Paternal Grandfather   . Coronary artery disease Unknown   . Heart disease Unknown   . Cancer Paternal Aunt        lung with mets to brain  . Cancer Paternal Uncle        skin cancer  . Cancer Paternal Uncle        lung/liver to brain  . Cancer Paternal Uncle        cancer of unknown type  . Hypertension Sister   . Cancer Sister   . Glaucoma Sister    History  Sexual Activity  . Sexual activity: Not Currently  . Partners: Male  . Birth control/ protection: None    Outpatient Encounter  Prescriptions as of 04/11/2017  Medication Sig  . aspirin EC 81 MG tablet Take 1 tablet (81 mg total) by mouth daily.  . carvedilol (COREG) 25 MG tablet Take 1 tablet (25 mg total) by mouth 2 (two) times daily with a meal.  . clopidogrel (PLAVIX) 75 MG tablet Take 1 tablet (75 mg total) by mouth daily. (Patient taking differently: Take 75 mg by mouth. )  . dexlansoprazole (DEXILANT) 60 MG capsule TAKE 1 CAPSULE (60 MG TOTAL) BY MOUTH DAILY.  . diphenhydrAMINE (BENADRYL) 25 mg capsule Take 2 capsules in AM and 1 capsule at night (Patient taking differently: Take 2 capsules in AM and 1 capsule at night PRN)  . fluticasone (FLONASE) 50 MCG/ACT nasal spray PLACE 2 SPRAYS INTO BOTH NOSTRILS DAILY.  . furosemide (LASIX) 40 MG tablet Take 1 tablet (40 mg total) by mouth daily.  [name] gabapentin (NEURONTIN) 300 MG capsule Take 1 capsule (300 mg total) by mouth 3 (three) times daily.  [name] glimepiride (AMARYL) 2 MG tablet Take 2 mg by mouth 2 (two) times daily. Takes 2 tablets in the am and then I tablet at night  . Glycopyrrolate-Formoterol (BEVESPI AEROSPHERE) 9-4.8 MCG/ACT AERO Inhale 2 puffs into the lungs 2 (two) times daily.  [name] guaiFENesin (MUCINEX) 600 MG 12 hr tablet Take 2 tablets (1,200 mg total) by mouth 2 (two) times daily.  . hydrochlorothiazide (HYDRODIURIL) 50 MG tablet Take 1 tablet (50 mg total) by mouth daily.  . isosorbide mononitrate (IMDUR) 30 MG 24 hr tablet Take 1 tablet (30 mg total) by mouth daily.  [name] levETIRAcetam (KEPPRA) 750 MG tablet TAKE 1 TABLET (750 MG TOTAL) BY MOUTH 2 (TWO) TIMES DAILY.  [name] losartan (COZAAR) 100 MG tablet Take 1 tablet (100 mg total) by mouth daily.  . Magnesium Hydroxide (MILK OF MAGNESIA PO) Take by mouth as needed.  . nitroGLYCERIN (NITROLINGUAL) 0.4 MG/SPRAY spray PLACE 1 SPRAY UNDER THE TONGUE EVERY 5 (FIVE) MINUTES X 3 DOSES AS NEEDED FOR CHEST PAIN.  . ONE TOUCH ULTRA TEST test strip USE AS DIRECTED FOR TESTING BLOOD GLUCOSE 3 TIMES DAILY  . phenytoin  (DILANTIN) 100 MG ER capsule Take 1 capsule (100 mg total) by mouth at bedtime.  . potassium chloride (KLOR-CON M10) 10 MEQ tablet Take 1 tablet (10 mEq total) by mouth 2 (two) times daily.  . ranolazine (RANEXA) 1000 MG SR tablet Take 1 tablet (1,000 mg total) by mouth 2 (two) times daily.  . rosuvastatin (CRESTOR) 10 MG  tablet TAKE 1 TABLET BY MOUTH DAILY  . VENTOLIN HFA 108 (90 Base) MCG/ACT inhaler INHALE 2 PUFFS EVERY 6 HOURS AS NEEDED FOR WHEEZING/SHORTNESS OF BREATH  . cyclobenzaprine (FLEXERIL) 10 MG tablet Take 1 tablet (10 mg total) by mouth at bedtime. (Patient not taking: Reported on 04/11/2017)  . nicotine (NICODERM CQ - DOSED IN MG/24 HOURS) 21 mg/24hr patch Place 21 mg onto the skin daily.  . vitamin B-12 (CYANOCOBALAMIN) 1000 MCG tablet Take 1,000 mcg by mouth daily.   No facility-administered encounter medications on file as of 04/11/2017.     Activities of Daily Living In your present state of health, do you have any difficulty performing the following activities: 04/11/2017  Hearing? N  Vision? N  Difficulty concentrating or making decisions? N  Walking or climbing stairs? Y  Dressing or bathing? Y  Doing errands, shopping? N  Preparing Food and eating ? N  Using the Toilet? N  Managing your Medications? N  Managing your Finances? N  Housekeeping or managing your Housekeeping? N  Some recent data might be hidden    Patient Care Team: Tower, [name], MD as PCP - General Ralene Ok, MD as Consulting Physician (General Surgery) Everitt Amber, MD as Consulting Physician (Obstetrics and Gynecology) [name], MD as Consulting Physician (Pulmonary Disease) Leo Grosser Seymour Bars, MD as Consulting Physician (Obstetrics and Gynecology) Jacelyn Pi, MD as Consulting Physician (Endocrinology) Marica Otter, OD (Optometry) Martinique, Peter M, MD as Consulting Physician (Cardiology) Lorretta Harp, MD as Consulting Physician (Cardiology) Bo Merino, MD as  Consulting Physician (Rheumatology)    Assessment:    Physical assessment deferred to PCP.  Exercise Activities and Dietary recommendations Current Exercise Habits: The patient does not participate in regular exercise at present, Exercise limited by: respiratory conditions(s);cardiac condition(s)  Goals    . Quit smoking / using tobacco      Fall Risk Fall Risk  04/11/2017 12/26/2016 06/21/2015 05/23/2015 04/20/2015  Falls in the past year? No Yes Yes Yes Yes  Number falls in past yr: - 1 1 1 2  or more  Injury with Fall? - Yes No Yes No  Risk Factor Category  - - - High Fall Risk -  Risk for fall due to : - - - Impaired balance/gait;Impaired vision -  Risk for fall due to (comments): - - - - -  Follow up - - - Follow up appointment Falls prevention discussed   Depression Screen PHQ 2/9 Scores 04/11/2017 06/21/2015 05/23/2015 05/10/2015  PHQ - 2 Score 0 0 0 0     Cognitive Function PLEASE NOTE: A Mini-Cog screen was completed. Maximum score is 20. A value of 0 denotes this part of [facility] MMSE was not completed or the patient failed this part of the Mini-Cog screening.   Mini-Cog Screening Orientation to Time - Max 5 pts Orientation to Place - Max 5 pts Registration - Max 3 pts Recall - Max 3 pts Language Repeat - Max 1 pts Language Follow 3 Step Command - Max 3 pts      Mini-Cog - 04/11/17 1405    Normal clock drawing test? yes   How many words correct? 3      MMSE - Mini Mental State Exam 04/11/2017  Orientation to time 5  Orientation to Place 5  Registration 3  Attention/ Calculation 0  Recall 3  Language- name 2 objects 0  Language- repeat 1  Language- follow 3 step command 3  Language- read & follow direction 0  Write  a sentence 0  Copy design 0  Total score 20        Immunization History  Administered Date(s) Administered  . Influenza Whole 06/01/2012  . Influenza,inj,Quad PF,36+ Mos 06/16/2013, 06/16/2014, 06/21/2015, 08/13/2016  . Pneumococcal  Conjugate-13 05/11/2016  . Pneumococcal Polysaccharide-23 11/18/1998, 11/04/2008, 06/16/2014  . Td 10/01/1996, 11/15/2009   Screening Tests Health Maintenance  Topic Date Due  . MAMMOGRAM  05/16/2016  . PAP SMEAR  10/02/2016  . HEMOGLOBIN A1C  02/10/2017  . OPHTHALMOLOGY EXAM  08/13/2017 (Originally 05/01/2014)  . HIV Screening  11/03/2023 (Originally 03/10/1971)  . INFLUENZA VACCINE  05/01/2017  . FOOT EXAM  08/13/2017  . COLONOSCOPY  03/17/2018  . TETANUS/TDAP  11/16/2019  . PNEUMOCOCCAL POLYSACCHARIDE VACCINE  Completed  . Hepatitis C Screening  Completed      Plan:    Follow-up w PCP as scheduled.  I have personally reviewed and noted the following in the patient's chart:   . Medical and social history . Use of alcohol, tobacco or illicit drugs  . Current medications and supplements . Functional ability and status . Nutritional status . Physical activity . Advanced directives . List of other physicians . Vitals . Screenings to include cognitive, depression, and falls . Referrals and appointments  In addition, I have reviewed and discussed with patient certain preventive protocols, quality metrics, and best practice recommendations. A written personalized care plan for preventive services as well as general preventive health recommendations were provided to patient.     Dorrene German, RN  04/11/2017

## 2017-04-04 NOTE — Progress Notes (Signed)
PCP notes:   Health maintenance: MMG due.  Pap smear due. Follows w/ Dr. Leo Grosser but does not have any upcoming appointments.  Abnormal screenings: None   Patient concerns: Patient reports intermittent lightheadedness that has worsened over the last 3-4 weeks. No triggers or associated symptoms that she has noticed and symptoms resolve spontaneously. CBGs running in the 150s during episodes. She has not checked BP at home.  She was recently referred to Dr. Rosendo Gros (gen surgery) by Dr. Denman George for finding of incisional hernia on CT scan in May 2018. Dr. Rosendo Gros wants her to have colonoscopy before having surgery. She just wanted PCP to be aware.  Nurse concerns: None   Next PCP appt: 04/19/17 @ 3:15pm

## 2017-04-10 ENCOUNTER — Telehealth: Payer: Self-pay | Admitting: Family Medicine

## 2017-04-10 DIAGNOSIS — I10 Essential (primary) hypertension: Secondary | ICD-10-CM

## 2017-04-10 DIAGNOSIS — E7800 Pure hypercholesterolemia, unspecified: Secondary | ICD-10-CM

## 2017-04-10 DIAGNOSIS — D649 Anemia, unspecified: Secondary | ICD-10-CM

## 2017-04-10 DIAGNOSIS — E538 Deficiency of other specified B group vitamins: Secondary | ICD-10-CM

## 2017-04-10 NOTE — Telephone Encounter (Signed)
-----   Message from Marchia Bond sent at 04/05/2017 10:12 AM EDT ----- Regarding: Cpx labs Thurs 7/12, need orders. Thanks ! :-) Please order  future cpx labs for pt's upcoming lab appt. Thanks Aniceto Boss

## 2017-04-11 ENCOUNTER — Other Ambulatory Visit (INDEPENDENT_AMBULATORY_CARE_PROVIDER_SITE_OTHER): Payer: Medicare Other

## 2017-04-11 ENCOUNTER — Ambulatory Visit (INDEPENDENT_AMBULATORY_CARE_PROVIDER_SITE_OTHER): Payer: Medicare Other

## 2017-04-11 VITALS — BP 102/50 | HR 60 | Ht <= 58 in | Wt 145.5 lb

## 2017-04-11 DIAGNOSIS — E7800 Pure hypercholesterolemia, unspecified: Secondary | ICD-10-CM | POA: Diagnosis not present

## 2017-04-11 DIAGNOSIS — R7989 Other specified abnormal findings of blood chemistry: Secondary | ICD-10-CM

## 2017-04-11 DIAGNOSIS — D649 Anemia, unspecified: Secondary | ICD-10-CM

## 2017-04-11 DIAGNOSIS — Z0000 Encounter for general adult medical examination without abnormal findings: Secondary | ICD-10-CM | POA: Diagnosis not present

## 2017-04-11 DIAGNOSIS — E538 Deficiency of other specified B group vitamins: Secondary | ICD-10-CM

## 2017-04-11 DIAGNOSIS — I10 Essential (primary) hypertension: Secondary | ICD-10-CM

## 2017-04-11 LAB — LIPID PANEL
Cholesterol: 153 mg/dL (ref 0–200)
HDL: 47.3 mg/dL (ref >39.00)
NonHDL: 105.3
Total CHOL/HDL Ratio: 3
Triglycerides: 231 mg/dL — ABNORMAL HIGH (ref 0.0–149.0)
VLDL: 46.2 mg/dL — ABNORMAL HIGH (ref 0.0–40.0)

## 2017-04-11 LAB — COMPREHENSIVE METABOLIC PANEL
ALT: 11 U/L (ref 0–35)
AST: 9 U/L (ref 0–37)
Albumin: 3.9 g/dL (ref 3.5–5.2)
Alkaline Phosphatase: 66 U/L (ref 39–117)
BUN: 13 mg/dL (ref 6–23)
CO2: 33 mEq/L — ABNORMAL HIGH (ref 19–32)
Calcium: 9.2 mg/dL (ref 8.4–10.5)
Chloride: 91 mEq/L — ABNORMAL LOW (ref 96–112)
Creatinine, Ser: 0.78 mg/dL (ref 0.40–1.20)
GFR: 79.26 mL/min (ref >60.00)
Glucose, Bld: 241 mg/dL — ABNORMAL HIGH (ref 70–99)
Potassium: 3.8 mEq/L (ref 3.5–5.1)
Sodium: 130 mEq/L — ABNORMAL LOW (ref 135–145)
Total Bilirubin: 0.3 mg/dL (ref 0.2–1.2)
Total Protein: 6.5 g/dL (ref 6.0–8.3)

## 2017-04-11 LAB — CBC WITH DIFFERENTIAL/PLATELET
Basophils Absolute: 0 10*3/uL (ref 0.0–0.1)
Basophils Relative: 0.6 % (ref 0.0–3.0)
Eosinophils Absolute: 0.1 10*3/uL (ref 0.0–0.7)
Eosinophils Relative: 1 % (ref 0.0–5.0)
HCT: 41.4 % (ref 36.0–46.0)
Hemoglobin: 14 g/dL (ref 12.0–15.0)
Lymphocytes Relative: 25.3 % (ref 12.0–46.0)
Lymphs Abs: 1.4 10*3/uL (ref 0.7–4.0)
MCHC: 33.7 g/dL (ref 30.0–36.0)
MCV: 80.3 fl (ref 78.0–100.0)
Monocytes Absolute: 0.7 10*3/uL (ref 0.1–1.0)
Monocytes Relative: 12.1 % — ABNORMAL HIGH (ref 3.0–12.0)
Neutro Abs: 3.3 10*3/uL (ref 1.4–7.7)
Neutrophils Relative %: 61 % (ref 43.0–77.0)
Platelets: 281 10*3/uL (ref 150.0–400.0)
RBC: 5.16 Mil/uL — ABNORMAL HIGH (ref 3.87–5.11)
RDW: 15.3 % (ref 11.5–15.5)
WBC: 5.4 10*3/uL (ref 4.0–10.5)

## 2017-04-11 LAB — LDL CHOLESTEROL, DIRECT: Direct LDL: 76 mg/dL

## 2017-04-11 LAB — TSH: TSH: 1.23 u[IU]/mL (ref 0.35–4.50)

## 2017-04-11 LAB — VITAMIN B12: Vitamin B-12: 327 pg/mL (ref 211–911)

## 2017-04-11 NOTE — Patient Instructions (Signed)
Diane [name] , Thank you for taking time to come for your Medicare Wellness Visit. I appreciate your ongoing commitment to your health goals. Please review the following plan we discussed and let me know if I can assist you in the future.   These are the goals we discussed: Goals    . Quit smoking / using tobacco       This is a list of the screening recommended for you and due dates:  Health Maintenance  Topic Date Due  . Mammogram  05/16/2016  . Pap Smear  10/02/2016  . Hemoglobin A1C  02/10/2017  . Eye exam for diabetics  08/13/2017*  . HIV Screening  11/03/2023*  . Flu Shot  05/01/2017  . Complete foot exam   08/13/2017  . Colon Cancer Screening  03/17/2018  . Tetanus Vaccine  11/16/2019  . Pneumococcal vaccine  Completed  .  Hepatitis C: One time screening is recommended by Center for Disease Control  (CDC) for  adults born from [birth year] through 1967.   Completed  *Topic was postponed. The date shown is not the original due date.   Preventive Care for Adults  A healthy lifestyle and preventive care can promote health and wellness. Preventive health guidelines for adults include the following key practices.  . A routine yearly physical is a good way to check with your health care provider about your health and preventive screening. It is a chance to share any concerns and updates on your health and to receive a thorough exam.  . Visit your dentist for a routine exam and preventive care every 6 months. Brush your teeth twice a day and floss once a day. Good oral hygiene prevents tooth decay and gum disease.  . The frequency of eye exams is based on your age, health, family medical history, use  of contact lenses, and other factors. Follow your health care provider's ecommendations for frequency of eye exams.  . Eat a healthy diet. Foods like vegetables, fruits, whole grains, low-fat dairy products, and lean protein foods contain the nutrients you need without too many calories.  Decrease your intake of foods high in solid fats, added sugars, and salt. Eat the right amount of calories for you. Get information about a proper diet from your health care provider, if necessary.  . Regular physical exercise is one of the most important things you can do for your health. Most adults should get at least 150 minutes of moderate-intensity exercise (any activity that increases your heart rate and causes you to sweat) each week. In addition, most adults need muscle-strengthening exercises on 2 or more days a week.  Silver Sneakers may be a benefit available to you. To determine eligibility, you may visit the website: www.silversneakers.com or contact program at (781)036-4936 Mon-Fri between 8AM-8PM.   . Maintain a healthy weight. The body mass index (BMI) is a screening tool to identify possible weight problems. It provides an estimate of body fat based on height and weight. Your health care provider can find your BMI and can help you achieve or maintain a healthy weight.   For adults 20 years and older: ? A BMI below 18.5 is considered underweight. ? A BMI of 18.5 to 24.9 is normal. ? A BMI of 25 to 29.9 is considered overweight. ? A BMI of 30 and above is considered obese.   . Maintain normal blood lipids and cholesterol levels by exercising and minimizing your intake of saturated fat. Eat a balanced diet with plenty  of fruit and vegetables. Blood tests for lipids and cholesterol should begin at age 38 and be repeated every 5 years. If your lipid or cholesterol levels are high, you are over 50, or you are at high risk for heart disease, you may need your cholesterol levels checked more frequently. Ongoing high lipid and cholesterol levels should be treated with medicines if diet and exercise are not working.  . If you smoke, find out from your health care provider how to quit. If you do not use tobacco, please do not start.  . If you choose to drink alcohol, please do not consume  more than 2 drinks per day. One drink is considered to be 12 ounces (355 mL) of beer, 5 ounces (148 mL) of wine, or 1.5 ounces (44 mL) of liquor.  . If you are 36-68 years old, ask your health care provider if you should take aspirin to prevent strokes.  . Use sunscreen. Apply sunscreen liberally and repeatedly throughout the day. You should seek shade when your shadow is shorter than you. Protect yourself by wearing long sleeves, pants, a wide-brimmed hat, and sunglasses year round, whenever you are outdoors.  . Once a month, do a whole body skin exam, using a mirror to look at the skin on your back. Tell your health care provider of new moles, moles that have irregular borders, moles that are larger than a pencil eraser, or moles that have changed in shape or color.     Steps to Quit Smoking Smoking tobacco can be bad for your health. It can also affect almost every organ in your body. Smoking puts you and people around you at risk for many serious long-lasting (chronic) diseases. Quitting smoking is hard, but it is one of the best things that you can do for your health. It is never too late to quit. What are the benefits of quitting smoking? When you quit smoking, you lower your risk for getting serious diseases and conditions. They can include:  Lung cancer or lung disease.  Heart disease.  Stroke.  Heart attack.  Not being able to have children (infertility).  Weak bones (osteoporosis) and broken bones (fractures).  If you have coughing, wheezing, and shortness of breath, those symptoms may get better when you quit. You may also get sick less often. If you are pregnant, quitting smoking can help to lower your chances of having a baby of low birth weight. What can I do to help me quit smoking? Talk with your doctor about what can help you quit smoking. Some things you can do (strategies) include:  Quitting smoking totally, instead of slowly cutting back how much you smoke over a  period of time.  Going to in-person counseling. You are more likely to quit if you go to many counseling sessions.  Using resources and support systems, such as: ? Database administrator with a Social worker. ? Phone quitlines. ? Careers information officer. ? Support groups or group counseling. ? Text messaging programs. ? Mobile phone apps or applications.  Taking medicines. Some of these medicines may have nicotine in them. If you are pregnant or breastfeeding, do not take any medicines to quit smoking unless your doctor says it is okay. Talk with your doctor about counseling or other things that can help you.  Talk with your doctor about using more than one strategy at the same time, such as taking medicines while you are also going to in-person counseling. This can help make quitting easier. What things  can I do to make it easier to quit? Quitting smoking might feel very hard at first, but there is a lot that you can do to make it easier. Take these steps:  Talk to your family and friends. Ask them to support and encourage you.  Call phone quitlines, reach out to support groups, or work with a Social worker.  Ask people who smoke to not smoke around you.  Avoid places that make you want (trigger) to smoke, such as: ? Bars. ? Parties. ? Smoke-break areas at work.  Spend time with people who do not smoke.  Lower the stress in your life. Stress can make you want to smoke. Try these things to help your stress: ? Getting regular exercise. ? Deep-breathing exercises. ? Yoga. ? Meditating. ? Doing a body scan. To do this, close your eyes, focus on one area of your body at a time from head to toe, and notice which parts of your body are tense. Try to relax the muscles in those areas.  Download or buy apps on your mobile phone or tablet that can help you stick to your quit plan. There are many free apps, such as QuitGuide from the [employer] Office manager for Disease Control and Prevention). You can find more  support from smokefree.gov and other websites.  This information is not intended to replace advice given to you by your health care provider. Make sure you discuss any questions you have with your health care provider. Document Released: 07/14/2009 Document Revised: 05/15/2016 Document Reviewed: 02/01/2015 Elsevier Interactive Patient Education  2018 [employer].

## 2017-04-11 NOTE — Progress Notes (Signed)
I reviewed health advisor's note, was available for consultation, and agree with documentation and plan.  

## 2017-04-15 ENCOUNTER — Ambulatory Visit: Payer: Medicare Other | Admitting: Rheumatology

## 2017-04-19 ENCOUNTER — Other Ambulatory Visit: Payer: Self-pay | Admitting: Pulmonary Disease

## 2017-04-19 ENCOUNTER — Ambulatory Visit (INDEPENDENT_AMBULATORY_CARE_PROVIDER_SITE_OTHER): Payer: Medicare Other | Admitting: Family Medicine

## 2017-04-19 ENCOUNTER — Encounter: Payer: Self-pay | Admitting: Family Medicine

## 2017-04-19 VITALS — BP 106/62 | HR 78 | Temp 97.8°F | Ht <= 58 in | Wt 146.5 lb

## 2017-04-19 DIAGNOSIS — Z1211 Encounter for screening for malignant neoplasm of colon: Secondary | ICD-10-CM | POA: Diagnosis not present

## 2017-04-19 DIAGNOSIS — J4489 Other specified chronic obstructive pulmonary disease: Secondary | ICD-10-CM

## 2017-04-19 DIAGNOSIS — E538 Deficiency of other specified B group vitamins: Secondary | ICD-10-CM | POA: Diagnosis not present

## 2017-04-19 DIAGNOSIS — E1142 Type 2 diabetes mellitus with diabetic polyneuropathy: Secondary | ICD-10-CM | POA: Diagnosis not present

## 2017-04-19 DIAGNOSIS — I10 Essential (primary) hypertension: Secondary | ICD-10-CM | POA: Diagnosis not present

## 2017-04-19 DIAGNOSIS — I25708 Atherosclerosis of coronary artery bypass graft(s), unspecified, with other forms of angina pectoris: Secondary | ICD-10-CM | POA: Diagnosis not present

## 2017-04-19 DIAGNOSIS — J449 Chronic obstructive pulmonary disease, unspecified: Secondary | ICD-10-CM

## 2017-04-19 DIAGNOSIS — E871 Hypo-osmolality and hyponatremia: Secondary | ICD-10-CM

## 2017-04-19 DIAGNOSIS — E7800 Pure hypercholesterolemia, unspecified: Secondary | ICD-10-CM | POA: Diagnosis not present

## 2017-04-19 DIAGNOSIS — J410 Simple chronic bronchitis: Secondary | ICD-10-CM | POA: Diagnosis not present

## 2017-04-19 DIAGNOSIS — Z794 Long term (current) use of insulin: Secondary | ICD-10-CM

## 2017-04-19 DIAGNOSIS — R569 Unspecified convulsions: Secondary | ICD-10-CM | POA: Diagnosis not present

## 2017-04-19 DIAGNOSIS — F17200 Nicotine dependence, unspecified, uncomplicated: Secondary | ICD-10-CM

## 2017-04-19 MED ORDER — DEXLANSOPRAZOLE 60 MG PO CPDR: DELAYED_RELEASE_CAPSULE | ORAL | 11 refills | Status: DC

## 2017-04-19 NOTE — Patient Instructions (Addendum)
We will refer for colonoscopy   Don't forget to schedule your mammogram and gyn visit and dexa  The [facility] center is [facility]   You may be a candidate for the new Shingrix vaccine when it is available  Check in with your pharmacy in about 6 months   Cut your hctz in 1/2 so you get 25 mg instead of 50  Your sodium level is low  Watch for swelling or high blood pressure  We will check sodium level in about 2 weeks   Let Dr Chalmers Cater know about your blood sugar

## 2017-04-19 NOTE — Progress Notes (Signed)
Subjective:    Patient ID: Diane [name], female    DOB: [birth year], 61 y.o.   MRN: [id number]  HPI Here for annual f/u for chronic medical problems   Had AMW 7/12-reviewed today  Wt Readings from Last 3 Encounters:  04/19/17 146 lb 8 oz (66.5 kg)  04/11/17 145 lb 8 oz (66 kg)  03/18/17 146 lb (66.2 kg)  30.62 kg/m     Had incisional hernia seen on CT 5/18 To see gen surg about this  Dr Rosendo Gros wants her to have a colonoscopy before surgery   Last colonoscopy 6/09 Was due for recall 2014    Gyn care  Dr Orvan July will make her own appt  Pap 12/13 Hx of HPV - but neg type 16,18 in 2013  Mammogram 8/16 with biopsy (b9) for microcalcification Self breast exam-no changes   dexa 10/14-at gyn Osteopenia femoral neck No falls or fractures   Zoster status - has not had shingles or vaccine    Smoking status-smokes (cut down) from 2 packs down to 1/1/2 pack  Working on it slowly / thinks about it constantly  Pulse ox 95% Sees pulmonary for cough and copd  Breathing is hard when it is hot   bp is stable today  No cp or palpitations or headaches or edema  No side effects to medicines  BP Readings from Last 3 Encounters:  04/19/17 106/62  04/11/17 (!) 102/50  03/18/17 116/62     Pulse Readings from Last 3 Encounters:  04/19/17 78  04/11/17 60  03/18/17 77      Chemistry      Component Value Date/Time   NA 130 (L) 04/11/2017 1408   NA 135 (L) 02/15/2017 1457   K 3.8 04/11/2017 1408   K 4.8 02/15/2017 1457   CL 91 (L) 04/11/2017 1408   CO2 33 (H) 04/11/2017 1408   CO2 26 02/15/2017 1457   BUN 13 04/11/2017 1408   BUN 10.8 02/15/2017 1457   CREATININE 0.78 04/11/2017 1408   CREATININE 0.7 02/15/2017 1457      Component Value Date/Time   CALCIUM 9.2 04/11/2017 1408   CALCIUM 9.3 02/15/2017 1457   ALKPHOS 66 04/11/2017 1408   ALKPHOS 75 02/15/2017 1457   AST 9 04/11/2017 1408   AST 9 02/15/2017 1457   ALT 11 04/11/2017 1408   ALT 13 02/15/2017  1457   BILITOT 0.3 04/11/2017 1408   BILITOT 0.28 02/15/2017 1457         Sees Dr Chalmers Cater for DM Stopped metformin due to nausea  Sugar is high [place] amaryl dose -not working well   Seizure hx -none in the past year    Hx of dizziness (no falls)- she is very careful  Hx of carotid stenosis and cva   Hx of B12 def Lab Results  Component Value Date   [id number] 474 04/11/2017   Hx of anemia Lab Results  Component Value Date   WBC 5.4 04/11/2017   HGB 14.0 04/11/2017   HCT 41.4 04/11/2017   MCV 80.3 04/11/2017   PLT 281.0 04/11/2017    Hyperlipidemia with vascular dz Lab Results  Component Value Date   CHOL 153 04/11/2017   CHOL 179 08/13/2016   CHOL 130 06/21/2015   Lab Results  Component Value Date   HDL 47.30 04/11/2017   HDL 65.20 08/13/2016   HDL 52.50 06/21/2015   Lab Results  Component Value Date   LDLCALC 77 08/13/2016   LDLCALC 43  06/21/2015   LDLCALC 79 06/09/2014   Lab Results  Component Value Date   TRIG 231.0 (H) 04/11/2017   TRIG 183.0 (H) 08/13/2016   TRIG 172.0 (H) 06/21/2015   Lab Results  Component Value Date   CHOLHDL 3 04/11/2017   CHOLHDL 3 08/13/2016   CHOLHDL 2 06/21/2015   Lab Results  Component Value Date   LDLDIRECT 76.0 04/11/2017   LDLDIRECT 98.0 06/09/2013   LDLDIRECT 53.6 12/12/2012   On crestor - every day/no missed doses  Avoids fatty foods  Goal LDL is under 70  Patient Active Problem List   Diagnosis Date Noted  . Colon cancer screening 04/19/2017  . Incisional hernia 01/14/2017  . Need for hepatitis C screening test 08/13/2016  . Smokers' cough ([place]) 08/13/2016  . Chronic respiratory failure ([place]) 07/11/2016  . COPD with chronic bronchitis ([place]) 05/20/2016  . Sleep-related hypoventilation due to lower airway obstruction 05/20/2016  . Periodic limb movements of sleep 05/20/2016  . CHF (congestive heart failure) ([place]) 05/08/2016  . Type 2 diabetes mellitus with diabetic polyneuropathy, with long-term  current use of insulin ([place]) 12/13/2015  . B12 deficiency 12/01/2015  . History of CVA (cerebrovascular accident) 11/29/2015  . Abnormal nuclear stress test 05/06/2015  . 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 of localized onset, not intractable, without status epilepticus ([place]) 03/22/2015  . Cervical disc disorder with radiculopathy of cervical region 03/22/2015  . Complex partial seizures ([place]) 09/16/2013  . Encounter for Medicare annual wellness exam 06/16/2013  . Routine general medical examination at a health care facility 06/08/2013  . Seizures ([place]) 03/25/2013  . Goiter 09/01/2012  . Other screening mammogram 06/13/2012  . Allergic rhinitis 06/13/2012  . Hyponatremia 12/11/2011  . Carotid artery disease ([place]) 08/20/2011  . DIZZINESS 07/26/2010  . Obstructive sleep apnea 09/18/2007  . TOBACCO USE 08/08/2007  . DIABETES MELLITUS, TYPE II 03/26/2007  . Hyperlipidemia 03/26/2007  . ANXIETY 03/26/2007  . DEPRESSION 03/26/2007  . MIGRAINE HEADACHE 03/26/2007  . CARPAL TUNNEL SYNDROME, BILATERAL 03/26/2007  . Essential hypertension 03/26/2007  . Coronary atherosclerosis 03/26/2007  . Cerebral artery occlusion with cerebral infarction ([place]) 03/26/2007  . GERD 03/26/2007  . Claudication in peripheral vascular disease ([place]) 03/26/2007   Past Medical History:  Diagnosis Date  . Anemia   . Carotid stenosis   . CHF (congestive heart failure) ([place])   . Chronic bronchitis ([place])   . Coronary artery disease    post CABG in 3/07   . CVA (cerebral infarction) 1993  . Diabetes mellitus    type 2  . Dyslipidemia   . Fibromyalgia   . GERD (gastroesophageal reflux disease)   . Goiter   . Headache    hx of   . Hyperlipidemia   . Hypertension   . Myocardial infarction ([place])   . Pneumonia    hx of   . PONV (postoperative nausea and vomiting)    only once was patient sick in the 1980s   . Seizures ([place])    last seizure- 03/2013   . Shortness  of breath dyspnea    with exertion or when fluid builds up   . Sleep apnea    used to wear a cpap- not used in 3 years   . Stroke ([place]) 1993  . Systolic murmur    known mild AS and MR  . Tobacco abuse    Past Surgical History:  Procedure Laterality Date  . APPENDECTOMY    . CARDIAC CATHETERIZATION  11/29/05  EF of 55%  . CARDIAC CATHETERIZATION  08/06/06   EF of 45-50%  . CARDIAC CATHETERIZATION N/A 05/06/2015   Procedure: Right/Left Heart Cath and Coronary/Graft Angiography;  Surgeon: Peter M Martinique, MD;  Location: [facility];  Service: Cardiovascular;  Laterality: N/A;  . CERVICAL FUSION  1990  . CHOLECYSTECTOMY    . CORONARY ARTERY BYPASS GRAFT  12/04/2005   x5 -- left internal mammary artery to the LAD, left radial artery to the ramus intermedius, saphenous vein graft to the obtuse marginal 1, sequential saphenous vein grat to the acute marginal and posterior descending, endoscopic vein harvesting from the left thigh with open vein harvest from right leg  . CORONARY STENT PLACEMENT  08/11/06   PCI of her ciurcumflex/OM vessel  . LAPAROTOMY Bilateral 05/19/2015   Procedure: EXPLORATORY LAPAROTOMY WITH BILATERAL SALPINGO OOPHORECTOMY /OMENTECTOMY/SEGMENTAL SIGMOID COLECTOMY ;  Surgeon: Everitt Amber, MD;  Location: [facility];  Service: Gynecology;  Laterality: Bilateral;  . PERIPHERAL VASCULAR CATHETERIZATION N/A 11/15/2015   Procedure: Carotid PTA/Stent Intervention;  Surgeon: Lorretta Harp, MD;  Location: [facility];  Service: Cardiovascular;  Laterality: N/A;  . PERIPHERAL VASCULAR CATHETERIZATION  11/15/2015   Procedure: Carotid Angiography;  Surgeon: Lorretta Harp, MD;  Location: [facility];  Service: Cardiovascular;;   Social History  Substance Use Topics  . Smoking status: Current Every Day Smoker    Packs/day: 1.50    Years: 50.00    Types: Cigarettes  . Smokeless tobacco: Never Used     Comment: 1.5 pack to 2 packs per day  . Alcohol use No   Family  History  Problem Relation Age of Onset  . Heart disease Mother   . Diabetes Mother   . COPD Mother   . Hyperlipidemia Mother   . Hypertension Mother   . Cancer Father   . Drug abuse Paternal Grandmother   . Stroke Paternal Grandfather   . Coronary artery disease Unknown   . Heart disease Unknown   . Cancer Paternal Aunt        lung with mets to brain  . Cancer Paternal Uncle        skin cancer  . Cancer Paternal Uncle        lung/liver to brain  . Cancer Paternal Uncle        cancer of unknown type  . Hypertension Sister   . Cancer Sister   . Glaucoma Sister    Allergies  Allergen Reactions  . Bee Venom Itching and Swelling  . Entresto [Sacubitril-Valsartan] Cough  . Tetracycline     REACTION: reaction not known  . Ace Inhibitors Cough   Current Outpatient Prescriptions on File Prior to Visit  Medication Sig Dispense Refill  . aspirin EC 81 MG tablet Take 1 tablet (81 mg total) by mouth daily. 30 tablet 6  . carvedilol (COREG) 25 MG tablet Take 1 tablet (25 mg total) by mouth 2 (two) times daily with a meal. 180 tablet 3  . clopidogrel (PLAVIX) 75 MG tablet Take 1 tablet (75 mg total) by mouth daily. (Patient taking differently: Take 75 mg by mouth. ) 90 tablet 3  . cyclobenzaprine (FLEXERIL) 10 MG tablet Take 1 tablet (10 mg total) by mouth at bedtime. 90 tablet 1  . diphenhydrAMINE (BENADRYL) 25 mg capsule Take 2 capsules in AM and 1 capsule at night (Patient taking differently: Take 2 capsules in AM and 1 capsule at night PRN) 270 capsule 3  . fluticasone (FLONASE)  50 MCG/ACT nasal spray PLACE 2 SPRAYS INTO BOTH NOSTRILS DAILY. 16 g 5  . furosemide (LASIX) 40 MG tablet Take 1 tablet (40 mg total) by mouth daily. 90 tablet 3  . gabapentin (NEURONTIN) 300 MG capsule Take 1 capsule (300 mg total) by mouth 3 (three) times daily. 270 capsule 3  . glimepiride (AMARYL) 2 MG tablet Take 2 mg by mouth 2 (two) times daily. Takes 2 tablets in the am and then I tablet at night    .  guaiFENesin (MUCINEX) 600 MG 12 hr tablet Take 2 tablets (1,200 mg total) by mouth 2 (two) times daily. 360 tablet 3  . hydrochlorothiazide (HYDRODIURIL) 50 MG tablet Take 1 tablet (50 mg total) by mouth daily. (Patient taking differently: Take 25 mg by mouth daily. ) 90 tablet 3  . isosorbide mononitrate (IMDUR) 30 MG 24 hr tablet Take 1 tablet (30 mg total) by mouth daily. 90 tablet 3  . levETIRAcetam (KEPPRA) 750 MG tablet TAKE 1 TABLET (750 MG TOTAL) BY MOUTH 2 (TWO) TIMES DAILY. 180 tablet 3  . losartan (COZAAR) 100 MG tablet Take 1 tablet (100 mg total) by mouth daily. 90 tablet 3  . Magnesium Hydroxide (MILK OF MAGNESIA PO) Take by mouth as needed.    . nicotine (NICODERM CQ - DOSED IN MG/24 HOURS) 21 mg/24hr patch Place 21 mg onto the skin daily.    . nitroGLYCERIN (NITROLINGUAL) 0.4 MG/SPRAY spray PLACE 1 SPRAY UNDER THE TONGUE EVERY 5 (FIVE) MINUTES X 3 DOSES AS NEEDED FOR CHEST PAIN. 12 g 3  . ONE TOUCH ULTRA TEST test strip USE AS DIRECTED FOR TESTING BLOOD GLUCOSE 3 TIMES DAILY  1  . phenytoin (DILANTIN) 100 MG ER capsule Take 1 capsule (100 mg total) by mouth at bedtime. 90 capsule 3  . potassium chloride (KLOR-CON M10) 10 MEQ tablet Take 1 tablet (10 mEq total) by mouth 2 (two) times daily. 180 tablet 3  . ranolazine (RANEXA) 1000 MG SR tablet Take 1 tablet (1,000 mg total) by mouth 2 (two) times daily. 180 tablet 3  . rosuvastatin (CRESTOR) 10 MG tablet TAKE 1 TABLET BY MOUTH DAILY 90 tablet 3  . VENTOLIN HFA 108 (90 Base) MCG/ACT inhaler INHALE 2 PUFFS EVERY 6 HOURS AS NEEDED FOR WHEEZING/SHORTNESS OF BREATH 18 Inhaler 5  . vitamin B-12 (CYANOCOBALAMIN) 1000 MCG tablet Take 1,000 mcg by mouth daily.     No current facility-administered medications on file prior to visit.     Review of Systems Review of Systems  Constitutional: Negative for fever, appetite change,  and unexpected weight change.  Eyes: Negative for pain and visual disturbance.  Respiratory: Negative for cough and  shortness of breath.   Cardiovascular: Negative for cp or palpitations    Gastrointestinal: Negative for nausea, diarrhea and constipation.  Genitourinary: Negative for urgency and frequency.  Skin: Negative for pallor or rash   MSK pos for chronic back pain  Neurological: Negative for weakness, numbness and headaches.  Hematological: Negative for adenopathy. Does not bruise/bleed easily.  Psychiatric/Behavioral: pos for stable anxiety and depression         Objective:   Physical Exam  Constitutional: She appears well-developed and well-nourished. No distress.  overwt and well appearing   HENT:  Head: Normocephalic and atraumatic.  Right Ear: External ear normal.  Left Ear: External ear normal.  Mouth/Throat: Oropharynx is clear and moist.  Eyes: Pupils are equal, round, and reactive to light. Conjunctivae and EOM are normal. No scleral icterus.  Neck: Normal range of motion. Neck supple. No JVD present. Carotid bruit is present. No thyromegaly present.  Cardiovascular: Normal rate, regular rhythm and intact distal pulses.  Exam reveals no gallop.   Murmur heard. Pulmonary/Chest: Effort normal and breath sounds normal. No respiratory distress. She has no wheezes. She exhibits no tenderness.  Abdominal: Soft. Bowel sounds are normal. She exhibits no distension, no abdominal bruit and no mass. There is no tenderness.  Small hernia under scar - reducible and non tender  Nl bs  Genitourinary: No breast swelling, tenderness, discharge or bleeding.  Genitourinary Comments: Breast exam: No mass, nodules, thickening, tenderness, bulging, retraction, inflamation, nipple discharge or skin changes noted.  No axillary or clavicular LA.      Musculoskeletal: Normal range of motion. She exhibits no edema or tenderness.  Lymphadenopathy:    She has no cervical adenopathy.  Neurological: She is alert. She has normal reflexes. No cranial nerve deficit. She exhibits normal muscle tone. Coordination  normal.  Skin: Skin is warm and dry. No rash noted. No erythema. No pallor.  Solar lentigines diffusely   Psychiatric: Her speech is normal and behavior is normal. Thought content normal. Her mood appears not anxious. Thought content is not paranoid. Cognition and memory are normal. She exhibits a depressed mood. She expresses no homicidal and no suicidal ideation.          Assessment & Plan:   Problem List Items Addressed This Visit      Cardiovascular and Mediastinum   Essential hypertension - Primary    bp in fair control at this time  BP Readings from Last 1 Encounters:  04/19/17 106/62   No changes needed Disc lifstyle change with low sodium diet and exercise  Due to low sodium level - will dec hctz to 25 mg  Watch for edema or sob  Re check 2 wk        Respiratory   COPD with chronic bronchitis (HCC)    Continue pulmonary f/u and work on smoking cessation      Smokers' cough ([place])    Continue pulmonary f/u  Keep working on quitting smoking         Endocrine   Type 2 diabetes mellitus with diabetic polyneuropathy, with long-term current use of insulin ([place])    Sees Dr Chalmers Cater  Off metformin due to nausea and now glucose is high She will contact Dr Chalmers Cater Disc diet/exercise         Other   B12 deficiency    Lab Results  Component Value Date   [id number] 211 04/11/2017   Ok with current supplementation       Colon cancer screening    Overdue for colonoscopy and pt needs one before surgeon will address hernia  Ref done to GI      Relevant Orders   Ambulatory referral to Gastroenterology   Hyperlipidemia    Disc goals for lipids and reasons to control them Rev labs with pt Rev low sat fat diet in detail  LDL at goal  Trig high due to DM Continue crestor       Hyponatremia    Level of 130  Will dec hctz to 25 mg watching carefully  No symptoms  Hx of seizure disorder  Re check in 2 wk      Seizures ([place])    None currently  tx by  neurology      TOBACCO USE    With copd Disc in detail risks of smoking  and possible outcomes including copd, vascular/ heart disease, cancer , respiratory and sinus infections  Pt voices understanding  She will continue to work on cessation by cutting down

## 2017-04-21 NOTE — Assessment & Plan Note (Signed)
Continue pulmonary f/u and work on smoking cessation

## 2017-04-21 NOTE — Assessment & Plan Note (Signed)
Sees Dr Chalmers Cater  Off metformin due to nausea and now glucose is high She will contact Dr Chalmers Cater Disc diet/exercise

## 2017-04-21 NOTE — Assessment & Plan Note (Signed)
None currently  tx by neurology

## 2017-04-21 NOTE — Assessment & Plan Note (Signed)
With copd Disc in detail risks of smoking and possible outcomes including copd, vascular/ heart disease, cancer , respiratory and sinus infections  Pt voices understanding  She will continue to work on cessation by cutting down

## 2017-04-21 NOTE — Assessment & Plan Note (Signed)
Continue pulmonary f/u  Keep working on quitting smoking

## 2017-04-21 NOTE — Assessment & Plan Note (Signed)
Level of 130  Will dec hctz to 25 mg watching carefully  No symptoms  Hx of seizure disorder  Re check in 2 wk

## 2017-04-21 NOTE — Assessment & Plan Note (Signed)
bp in fair control at this time  BP Readings from Last 1 Encounters:  04/19/17 106/62   No changes needed Disc lifstyle change with low sodium diet and exercise  Due to low sodium level - will dec hctz to 25 mg  Watch for edema or sob  Re check 2 wk

## 2017-04-21 NOTE — Assessment & Plan Note (Signed)
Lab Results  Component Value Date   [id number] 568 04/11/2017   Ok with current supplementation

## 2017-04-21 NOTE — Assessment & Plan Note (Signed)
Overdue for colonoscopy and pt needs one before surgeon will address hernia  Ref done to GI

## 2017-04-21 NOTE — Assessment & Plan Note (Signed)
Disc goals for lipids and reasons to control them Rev labs with pt Rev low sat fat diet in detail  LDL at goal  Trig high due to DM Continue crestor

## 2017-04-29 ENCOUNTER — Encounter: Payer: Self-pay | Admitting: Neurology

## 2017-04-29 ENCOUNTER — Ambulatory Visit (INDEPENDENT_AMBULATORY_CARE_PROVIDER_SITE_OTHER): Payer: Medicare Other | Admitting: Neurology

## 2017-04-29 VITALS — BP 102/68 | HR 76 | Ht <= 58 in | Wt 147.1 lb

## 2017-04-29 DIAGNOSIS — M5010 Cervical disc disorder with radiculopathy, unspecified cervical region: Secondary | ICD-10-CM | POA: Diagnosis not present

## 2017-04-29 DIAGNOSIS — G40009 Localization-related (focal) (partial) idiopathic epilepsy and epileptic syndromes with seizures of localized onset, not intractable, without status epilepticus: Secondary | ICD-10-CM | POA: Diagnosis not present

## 2017-04-29 DIAGNOSIS — I25708 Atherosclerosis of coronary artery bypass graft(s), unspecified, with other forms of angina pectoris: Secondary | ICD-10-CM | POA: Diagnosis not present

## 2017-04-29 DIAGNOSIS — F17200 Nicotine dependence, unspecified, uncomplicated: Secondary | ICD-10-CM | POA: Diagnosis not present

## 2017-04-29 MED ORDER — LEVETIRACETAM 1000 MG PO TABS: 1000.0000 mg | ORAL_TABLET | Freq: Two times a day (BID) | ORAL | 5 refills | Status: DC

## 2017-04-29 NOTE — Progress Notes (Signed)
NEUROLOGY FOLLOW UP OFFICE NOTE  Diane [name] [id number]  HISTORY OF PRESENT ILLNESS: Diane [name] is a 61 year old right-handed woman with CAD s/p CABG, hypertension, hyperlipidemia, type II diabetes mellitus, tobacco use, stroke, depression, fibromyalgia, migraine, OSA and COPD who follows up for neck pain and seizure disorder.     UPDATE: In March, Dilantin ER was decreased to 100mg  at bedtime (from twice daily) as level was undetectable anyway.  She is also now taking gabapentin 600mg  at bedtime.  She is doing well.  Seizure disorder: Medications:  Dilantin ER 100mg  at bedtime, Keppra 750mg  twice daily, gabapentin 600mg  at bedtime (for neck pain) Last spell:  03/18/13.  She would like to discontinue one of her anticonvulsants.   Neck pain: She is taking gabapentin 600mg  at bedtime.  Pain is controlled.  04/11/17 LABS:  CBC with WBC 5.4, HGB 14, HCT 41.4 and PLT 281; CMP with Na 130, K 3.8, Cl 91, CO2 33, glucose 241, BUN 13, Cr 0.78, total bili 0.3, ALP 66, AST 9 and ALT 11; TSH 1.23; B12 327.   HISTORY: Seizure disorder: She was admitted to [facility] from 03/03/13 to 03/06/13 for recurrent seizure-like episodes.  These spells are were witnessed by Dr. Wallie Char, neuro-hospitalist, and described as "head turning to the right as well as eye deviation to the right, stiffening of right extremities with flexion of right upper extremity, and convulsive movements of right extremities. Spell lasted about 40-50 seconds and resolved without intervention. Within 30 seconds patient was again awake and conversant. She was slightly confused with respect to time."  She is unable to speak during these events.  Sometimes she was aware during these events and other times she was unaware and amnestic.  She did have urinary incontinence but no tongue biting.  It is reported that she can write down what she wants to say during these spells.  Given her stroke history, she was evaluated for  possible left hemispheric stroke.  By his assessment, it seemed that her speech difficulties were non-organic, seizure-like activity non-organic, and he did not suspect acute stroke.     She had continuous EEG monitoring, which captured multiple spells without electrographic correlate.  However, she did have one captured clinical event, described as "build up to rhythmical theta activity and resultant slowing companied by clinical changes.  This may very well represent epileptiform activity.  No interictal abnormalities were noted".  Psychiatry was consulted, regarding possible pseudoseizures.  Despite the possible diagnosis of pseudoseizures, the neuro-hospitalists never made a sole diagnosis of pseudoseizures in the hospital notes.     I personally reviewed the EEG.  At 1:57:48, patient pauses, head slightly turned left, staring, with little movement and no convulsions.  She does pull at the covers a little, but no appreciable stereotypical hand movements.  When it is over, she begins moving around again.  Electrographically, she does have a build up of generalized theta activity up until 1:58:26, followed by generalized delta slowing lasting until 1:58:45.  Difficult to lateralize, but may be more prominent in the left hemisphere.  However, this appears to only be a clip of the event and the rest of the study is not available to me.  Therefore I cannot comment on the other captured spells.   MRI of brain performed on 03/03/13, which reported possible tiny acute infarct in the posterior left frontal lobe, but neurology believed this to be artifact.  There was also mention of incidental tiny cysts in  right hippocampal region.  Encephalomalacia in the right frontoparietal region, from prior remote stroke, is seen.  MRI of brain without contrast from 12/08/15 showed chronic right MCA infarct but also showed chronic looking lacunar infarcts in the bilateral basal ganglia and left thalamus, which are new compared to  prior imaging from June 2014.   Cervical radiculopathy: She has history of right sided neck pain radiating down to the right shoulder, associated with tingling from the right ear down to the shoulder.  Hot and cold compresses help too.  No numbness, pain or weakness down the arm.  Pain worse when turning neck to the left.  She has trigger finger and numbness in the fingers when driving or picking up objects, thought to be carpal tunnel syndrome.  She has history of cervical fusion in the 1980s.  PT was ineffective.  [name]  MRI of the cervical spine performed on 05/23/14 showed moderately large foraminal disc protrusion and associated osteophyte at C3-4, causing right foraminal encroachment.  Solid fusion C5 through C7 noted.  She was referred for right C4 nerve root injection, however it was not done.   PAST MEDICAL HISTORY: Past Medical History:  Diagnosis Date  . Anemia   . Carotid stenosis   . CHF (congestive heart failure) ([place])   . Chronic bronchitis ([place])   . Coronary artery disease    post CABG in 3/07   . CVA (cerebral infarction) 1993  . Diabetes mellitus    type 2  . Dyslipidemia   . Fibromyalgia   . GERD (gastroesophageal reflux disease)   . Goiter   . Headache    hx of   . Hyperlipidemia   . Hypertension   . Myocardial infarction ([place])   . Pneumonia    hx of   . PONV (postoperative nausea and vomiting)    only once was patient sick in the 1980s   . Seizures ([place])    last seizure- 03/2013   . Shortness of breath dyspnea    with exertion or when fluid builds up   . Sleep apnea    used to wear a cpap- not used in 3 years   . Stroke ([place]) 1993  . Systolic murmur    known mild AS and MR  . Tobacco abuse     MEDICATIONS: Current Outpatient Prescriptions on File Prior to Visit  Medication Sig Dispense Refill  . aspirin EC 81 MG tablet Take 1 tablet (81 mg total) by mouth daily. 30 tablet 6  . BEVESPI AEROSPHERE 9-4.8 MCG/ACT AERO INHALE 2 PUFFS INTO THE LUNGS 2 (TWO)  TIMES DAILY. 10.7 Inhaler 5  . carvedilol (COREG) 25 MG tablet Take 1 tablet (25 mg total) by mouth 2 (two) times daily with a meal. 180 tablet 3  . clopidogrel (PLAVIX) 75 MG tablet Take 1 tablet (75 mg total) by mouth daily. (Patient taking differently: Take 75 mg by mouth. ) 90 tablet 3  . cyclobenzaprine (FLEXERIL) 10 MG tablet Take 1 tablet (10 mg total) by mouth at bedtime. 90 tablet 1  . dexlansoprazole (DEXILANT) 60 MG capsule TAKE 1 CAPSULE (60 MG TOTAL) BY MOUTH DAILY. 30 capsule 11  . diphenhydrAMINE (BENADRYL) 25 mg capsule Take 2 capsules in AM and 1 capsule at night (Patient taking differently: Take 2 capsules in AM and 1 capsule at night PRN) 270 capsule 3  . fluticasone (FLONASE) 50 MCG/ACT nasal spray PLACE 2 SPRAYS INTO BOTH NOSTRILS DAILY. 16 g 5  . furosemide (LASIX) 40 MG  tablet Take 1 tablet (40 mg total) by mouth daily. 90 tablet 3  . gabapentin (NEURONTIN) 300 MG capsule Take 1 capsule (300 mg total) by mouth 3 (three) times daily. 270 capsule 3  . glimepiride (AMARYL) 2 MG tablet Take 2 mg by mouth 2 (two) times daily. Takes 2 tablets in the am and then I tablet at night    . guaiFENesin (MUCINEX) 600 MG 12 hr tablet Take 2 tablets (1,200 mg total) by mouth 2 (two) times daily. 360 tablet 3  . hydrochlorothiazide (HYDRODIURIL) 50 MG tablet Take 1 tablet (50 mg total) by mouth daily. (Patient taking differently: Take 25 mg by mouth daily. ) 90 tablet 3  . isosorbide mononitrate (IMDUR) 30 MG 24 hr tablet Take 1 tablet (30 mg total) by mouth daily. 90 tablet 3  . losartan (COZAAR) 100 MG tablet Take 1 tablet (100 mg total) by mouth daily. 90 tablet 3  . Magnesium Hydroxide (MILK OF MAGNESIA PO) Take by mouth as needed.    . nicotine (NICODERM CQ - DOSED IN MG/24 HOURS) 21 mg/24hr patch Place 21 mg onto the skin daily.    . nitroGLYCERIN (NITROLINGUAL) 0.4 MG/SPRAY spray PLACE 1 SPRAY UNDER THE TONGUE EVERY 5 (FIVE) MINUTES X 3 DOSES AS NEEDED FOR CHEST PAIN. 12 g 3  . ONE  TOUCH ULTRA TEST test strip USE AS DIRECTED FOR TESTING BLOOD GLUCOSE 3 TIMES DAILY  1  . potassium chloride (KLOR-CON M10) 10 MEQ tablet Take 1 tablet (10 mEq total) by mouth 2 (two) times daily. 180 tablet 3  . ranolazine (RANEXA) 1000 MG SR tablet Take 1 tablet (1,000 mg total) by mouth 2 (two) times daily. 180 tablet 3  . rosuvastatin (CRESTOR) 10 MG tablet TAKE 1 TABLET BY MOUTH DAILY 90 tablet 3  . VENTOLIN HFA 108 (90 Base) MCG/ACT inhaler INHALE 2 PUFFS EVERY 6 HOURS AS NEEDED FOR WHEEZING/SHORTNESS OF BREATH 18 Inhaler 5  . vitamin B-12 (CYANOCOBALAMIN) 1000 MCG tablet Take 1,000 mcg by mouth daily.     No current facility-administered medications on file prior to visit.     ALLERGIES: Allergies  Allergen Reactions  . Bee Venom Itching and Swelling  . Entresto [Sacubitril-Valsartan] Cough  . Tetracycline     REACTION: reaction not known  . Ace Inhibitors Cough    FAMILY HISTORY: Family History  Problem Relation Age of Onset  . Heart disease Mother   . Diabetes Mother   . COPD Mother   . Hyperlipidemia Mother   . Hypertension Mother   . Cancer Father   . Drug abuse Paternal Grandmother   . Stroke Paternal Grandfather   . Coronary artery disease Unknown   . Heart disease Unknown   . Cancer Paternal Aunt        lung with mets to brain  . Cancer Paternal Uncle        skin cancer  . Cancer Paternal Uncle        lung/liver to brain  . Cancer Paternal Uncle        cancer of unknown type  . Hypertension Sister   . Cancer Sister   . Glaucoma Sister     SOCIAL HISTORY: Social History   Social History  . Marital status: Divorced    Spouse name: N/A  . Number of children: N/A  . Years of education: N/A   Occupational History  . Not on file.   Social History Main Topics  . Smoking status: Current Every Day Smoker  Packs/day: 1.50    Years: 50.00    Types: Cigarettes  . Smokeless tobacco: Never Used     Comment: 1.5 pack to 2 packs per day  . Alcohol use  No  . Drug use: No  . Sexual activity: Not Currently    Partners: Male    Birth control/ protection: None   Other Topics Concern  . Not on file   Social History Narrative  . No narrative on file    REVIEW OF SYSTEMS: Constitutional: No fevers, chills, or sweats, no generalized fatigue, change in appetite Eyes: No visual changes, double vision, eye pain Ear, nose and throat: No hearing loss, ear pain, nasal congestion, sore throat Cardiovascular: No chest pain, palpitations Respiratory:  No shortness of breath at rest or with exertion, wheezes GastrointestinaI: No nausea, vomiting, diarrhea, abdominal pain, fecal incontinence Genitourinary:  No dysuria, urinary retention or frequency Musculoskeletal:  No neck pain, back pain Integumentary: No rash, pruritus, skin lesions Neurological: as above Psychiatric: No depression, insomnia, anxiety Endocrine: No palpitations, fatigue, diaphoresis, mood swings, change in appetite, change in weight, increased thirst Hematologic/Lymphatic:  No purpura, petechiae. Allergic/Immunologic: no itchy/runny eyes, nasal congestion, recent allergic reactions, rashes  PHYSICAL EXAM: Vitals:   04/29/17 1404  BP: 102/68  Pulse: 76   General: No acute distress.  Patient appears well-groomed.  Head:  Normocephalic/atraumatic Eyes:  Fundi examined but not visualized Neck: supple, no paraspinal tenderness, full range of motion Heart:  Regular rate and rhythm Lungs:  Clear to auscultation bilaterally Back: No paraspinal tenderness Neurological Exam: alert and oriented to person, place, and time. Attention span and concentration intact, recent and remote memory intact, fund of knowledge intact.  Speech fluent and not dysarthric, language intact.  CN II-XII intact. Bulk and tone normal, muscle strength 5/5 throughout.  Sensation to light touch, temperature and vibration intact.  Deep tendon reflexes 2+ throughout, toes downgoing.  Finger to nose and heel to  shin testing intact.  Gait normal, Romberg with sway.  IMPRESSION: Localization related epilepsy with complex partial seizures stable Cervical radiculopathy Tobacco use  PLAN: 1.  Will discontinue Dilantin ER 100mg  (on subtherapeutic dose anyway with previous undetectable level) and increase Keppra to 1000mg  twice daily. 2.  Continue gabapentin 3.  Smoking cessation 4.  Follow up in 5 months.  18 minutes spent face to face with patient, over 50% spent discussing management.  Metta Clines, DO  CC:  Loura Pardon, MD

## 2017-04-29 NOTE — Patient Instructions (Signed)
1.  Stop Dilantin 2.  Increase Keppra (levetiracetam) to 1000mg  twice daily 3.  Continue gabapentin 4.  Follow up in 5 months.

## 2017-05-01 ENCOUNTER — Ambulatory Visit: Payer: Self-pay | Admitting: Gastroenterology

## 2017-05-06 ENCOUNTER — Other Ambulatory Visit (INDEPENDENT_AMBULATORY_CARE_PROVIDER_SITE_OTHER): Payer: Medicare Other

## 2017-05-06 DIAGNOSIS — E871 Hypo-osmolality and hyponatremia: Secondary | ICD-10-CM | POA: Diagnosis not present

## 2017-05-06 NOTE — Addendum Note (Signed)
Addended by: Ellamae Sia on: 05/06/2017 12:53 PM   Modules accepted: Orders

## 2017-05-07 ENCOUNTER — Other Ambulatory Visit: Payer: Self-pay | Admitting: *Deleted

## 2017-05-07 ENCOUNTER — Telehealth: Payer: Self-pay | Admitting: *Deleted

## 2017-05-07 ENCOUNTER — Encounter: Payer: Self-pay | Admitting: Gastroenterology

## 2017-05-07 ENCOUNTER — Encounter: Payer: Self-pay | Admitting: *Deleted

## 2017-05-07 ENCOUNTER — Ambulatory Visit (INDEPENDENT_AMBULATORY_CARE_PROVIDER_SITE_OTHER): Payer: Medicare Other | Admitting: Gastroenterology

## 2017-05-07 VITALS — BP 110/60 | HR 80 | Ht <= 58 in | Wt 148.0 lb

## 2017-05-07 DIAGNOSIS — R1031 Right lower quadrant pain: Secondary | ICD-10-CM | POA: Insufficient documentation

## 2017-05-07 DIAGNOSIS — K5909 Other constipation: Secondary | ICD-10-CM | POA: Diagnosis not present

## 2017-05-07 DIAGNOSIS — Z7902 Long term (current) use of antithrombotics/antiplatelets: Secondary | ICD-10-CM | POA: Diagnosis not present

## 2017-05-07 LAB — BASIC METABOLIC PANEL
BUN: 10 mg/dL (ref 7–25)
CO2: 24 mmol/L (ref 20–32)
Calcium: 9 mg/dL (ref 8.6–10.4)
Chloride: 99 mmol/L (ref 98–110)
Creat: 0.69 mg/dL (ref 0.50–0.99)
Glucose, Bld: 204 mg/dL — ABNORMAL HIGH (ref 65–99)
Potassium: 4.6 mmol/L (ref 3.5–5.3)
Sodium: 137 mmol/L (ref 135–146)

## 2017-05-07 MED ORDER — POLYETHYLENE GLYCOL 3350 17 GM/SCOOP PO POWD: ORAL | 3 refills | Status: DC

## 2017-05-07 NOTE — Telephone Encounter (Signed)
She may stop Plavix 5 days prior to endoscopic procedure.  Emilyanne Mcgough [place] MD, [facility]

## 2017-05-07 NOTE — Telephone Encounter (Signed)
05/07/2017   RE: LUNETTA MARINA DOB: 1956/03/16 MRN: [id number]   Dear Dr. Peter [place],    We have scheduled the above patient for an endoscopic procedure. Our records show that she is on anticoagulation therapy.   Please advise as to how long the patient may come off her therapy of Plavix prior to the procedure, which is scheduled for 06-04-2017  Please  route the Plavix clearance instructions to [place] .   Sincerely,    Alonza Bogus PA-C

## 2017-05-07 NOTE — Patient Instructions (Signed)
You have been scheduled for a colonoscopy. Please follow written instructions given to you at your visit today. If you use inhalers (even only as needed), please bring them with you on the day of your procedure. Your physician has requested that you go to www.startemmi.com and enter the access code given to you at your visit today. This web site gives a general overview about your procedure. However, you should still follow specific instructions given to you by our office regarding your preparation for the procedure.  We sent generic Miralax to the pharmacy.

## 2017-05-07 NOTE — Progress Notes (Signed)
05/07/2017 Diane [name] [id number] 05/21/1956   HISTORY OF PRESENT ILLNESS:  This is a pleasant 61 year old female who is previously known to Dr. Fuller Plan. Her last colonoscopy was in June 2009 at which time she is found have some hyperplastic polyps as well as internal hemorrhoids. She actually presents back to our office today at the request of her PCP, Dr. Glori Bickers, and general surgeon, Dr. Rosendo Gros, for evaluation regarding right-sided abdominal pain and consideration of colonoscopy. Her last colonoscopy was in June 2009 at which time she was found to have hyperplastic polyps, that were removed, and internal hemorrhoids. She tells me that in August 2016 she had to have surgery for removal of a 26 cm tumor in her abdomen that also required resection of part of her sigmoid colon.  This was performed by Dr. Denman George.  She's now been experiencing right-sided abdominal pain for several months.  CT scan of the abdomen and pelvis with contrast in May 2018 showed a small incisional hernia in the right rectus abdominis musculature containing short segment the proximal transverse colon and small bowel without evidence of incarceration or obstruction. There were no other pain causing abnormalities identified on the scan. Dr. Denman George who is ordering physician requested that she see Dr. Rosendo Gros to discuss her hernia and evaluate to see if it could be contributing to her symptoms and if she were surgical candidate. After being evaluated with Dr. Rosendo Gros he was requesting colonoscopy prior to considering surgery. Patient admits to having issues with constipation. She says that usually if she skips 2 days without a bowel movement she'll take a dose of milk of magnesia for a day or 2, which then typically helps. She denies seeing any blood in her stool. Her pain is localized to the right lower quadrant  She does have multiple medical problems including CHF with an ejection fraction less than 35%, carotid stenosis, coronary  artery disease, history of CVA, hypertension, hyperlipidemia, peripheral vascular disease. She is on Plavix, which is being controlled by Dr. Martinique.   Past Medical History:  Diagnosis Date  . Anemia   . Carotid stenosis   . CHF (congestive heart failure) ([place])   . Chronic bronchitis ([place])   . Colon polyps   . Coronary artery disease    post CABG in 3/07   . CVA (cerebral infarction) 1993  . Diabetes mellitus    type 2  . Dyslipidemia   . Fibromyalgia   . GERD (gastroesophageal reflux disease)   . Headache    hx of   . Hyperlipidemia   . Hypertension   . Myocardial infarction ([place])   . Pneumonia    hx of   . PONV (postoperative nausea and vomiting)    only once was patient sick in the 1980s   . Seizure ([place])   . Seizures ([place])    last seizure- 03/2013   . Shortness of breath dyspnea    with exertion or when fluid builds up   . Sleep apnea    used to wear a cpap- not used in 3 years   . Status post dilation of esophageal narrowing   . Stroke ([place]) 1993  . Systolic murmur    known mild AS and MR  . Thyroid goiter   . Tobacco abuse    Past Surgical History:  Procedure Laterality Date  . APPENDECTOMY    . CARDIAC CATHETERIZATION  11/29/05   EF of 55%  . CARDIAC CATHETERIZATION  08/06/06   EF  of 45-50%  . CARDIAC CATHETERIZATION N/A 05/06/2015   Procedure: Right/Left Heart Cath and Coronary/Graft Angiography;  Surgeon: Peter M Martinique, MD;  Location: [facility];  Service: Cardiovascular;  Laterality: N/A;  . CERVICAL FUSION  1990  . CHOLECYSTECTOMY    . COLON RESECTION     mass removed and 4 in of colon  . CORONARY ARTERY BYPASS GRAFT  12/04/2005   x5 -- left internal mammary artery to the LAD, left radial artery to the ramus intermedius, saphenous vein graft to the obtuse marginal 1, sequential saphenous vein grat to the acute marginal and posterior descending, endoscopic vein harvesting from the left thigh with open vein harvest from right leg  . CORONARY STENT  PLACEMENT  08/11/06   PCI of her ciurcumflex/OM vessel  . LAPAROTOMY Bilateral 05/19/2015   Procedure: EXPLORATORY LAPAROTOMY WITH BILATERAL SALPINGO OOPHORECTOMY /OMENTECTOMY/SEGMENTAL SIGMOID COLECTOMY ;  Surgeon: Everitt Amber, MD;  Location: [facility];  Service: Gynecology;  Laterality: Bilateral;  . PERIPHERAL VASCULAR CATHETERIZATION N/A 11/15/2015   Procedure: Carotid PTA/Stent Intervention;  Surgeon: Lorretta Harp, MD;  Location: [facility];  Service: Cardiovascular;  Laterality: N/A;  . PERIPHERAL VASCULAR CATHETERIZATION  11/15/2015   Procedure: Carotid Angiography;  Surgeon: Lorretta Harp, MD;  Location: [facility];  Service: Cardiovascular;;    reports that she has been smoking Cigarettes.  She has a 75.00 pack-year smoking history. She has never used smokeless tobacco. She reports that she does not drink alcohol or use drugs. family history includes COPD in her mother; Cancer in her father, paternal uncle, and sister; Diabetes in her mother; Drug abuse in her paternal grandmother; Glaucoma in her sister; Heart attack in her father; Heart disease in her maternal aunt, maternal grandfather, and mother; Hyperlipidemia in her mother; Hypertension in her mother and sister; Lung cancer in her paternal aunt and paternal uncle; Melanoma in her paternal uncle; Stroke in her paternal grandfather. Allergies  Allergen Reactions  . Bee Venom Itching and Swelling  . Entresto [Sacubitril-Valsartan] Cough  . Tetracycline     REACTION: reaction not known  . Ace Inhibitors Cough      Outpatient Encounter Prescriptions as of 05/07/2017  Medication Sig  . Acetaminophen (TYLENOL EXTRA STRENGTH PO) Take 1-2 tablets by mouth as needed.  [name] aspirin EC 81 MG tablet Take 1 tablet (81 mg total) by mouth daily.  [name] BEVESPI AEROSPHERE 9-4.8 MCG/ACT AERO INHALE 2 PUFFS INTO THE LUNGS 2 (TWO) TIMES DAILY.  . carvedilol (COREG) 25 MG tablet Take 1 tablet (25 mg total) by mouth 2 (two) times daily  with a meal.  . clopidogrel (PLAVIX) 75 MG tablet Take 1 tablet (75 mg total) by mouth daily.  . cyclobenzaprine (FLEXERIL) 10 MG tablet Take 1 tablet (10 mg total) by mouth at bedtime. (Patient taking differently: Take 10 mg by mouth as needed. )  . dexlansoprazole (DEXILANT) 60 MG capsule TAKE 1 CAPSULE (60 MG TOTAL) BY MOUTH DAILY.  . diphenhydrAMINE (BENADRYL) 25 mg capsule Take 2 capsules in AM and 1 capsule at night (Patient taking differently: Take 2 capsules in AM and 1 capsule at night PRN)  . fluticasone (FLONASE) 50 MCG/ACT nasal spray PLACE 2 SPRAYS INTO BOTH NOSTRILS DAILY.  . furosemide (LASIX) 40 MG tablet Take 1 tablet (40 mg total) by mouth daily.  [name] gabapentin (NEURONTIN) 300 MG capsule Take 1 capsule (300 mg total) by mouth 3 (three) times daily.  [name] glimepiride (AMARYL) 2 MG tablet Take 2  mg by mouth 2 (two) times daily. Takes 2 tablets in the am and then I tablet at night  . guaiFENesin (MUCINEX) 600 MG 12 hr tablet Take 2 tablets (1,200 mg total) by mouth 2 (two) times daily.  . hydrochlorothiazide (HYDRODIURIL) 50 MG tablet Take 1 tablet (50 mg total) by mouth daily. (Patient taking differently: Take 25 mg by mouth daily. )  . isosorbide mononitrate (IMDUR) 30 MG 24 hr tablet Take 1 tablet (30 mg total) by mouth daily.  [name] levETIRAcetam (KEPPRA) 1000 MG tablet Take 1 tablet (1,000 mg total) by mouth 2 (two) times daily.  [name] losartan (COZAAR) 100 MG tablet Take 1 tablet (100 mg total) by mouth daily.  . Magnesium Hydroxide (MILK OF MAGNESIA PO) Take by mouth as needed.  . nicotine (NICODERM CQ - DOSED IN MG/24 HOURS) 21 mg/24hr patch Place 21 mg onto the skin daily.  . nitroGLYCERIN (NITROLINGUAL) 0.4 MG/SPRAY spray PLACE 1 SPRAY UNDER THE TONGUE EVERY 5 (FIVE) MINUTES X 3 DOSES AS NEEDED FOR CHEST PAIN.  . ONE TOUCH ULTRA TEST test strip USE AS DIRECTED FOR TESTING BLOOD GLUCOSE 3 TIMES DAILY  . potassium chloride (KLOR-CON M10) 10 MEQ tablet Take 1 tablet (10 mEq total) by  mouth 2 (two) times daily.  . ranolazine (RANEXA) 1000 MG SR tablet Take 1 tablet (1,000 mg total) by mouth 2 (two) times daily.  . rosuvastatin (CRESTOR) 10 MG tablet TAKE 1 TABLET BY MOUTH DAILY  . VENTOLIN HFA 108 (90 Base) MCG/ACT inhaler INHALE 2 PUFFS EVERY 6 HOURS AS NEEDED FOR WHEEZING/SHORTNESS OF BREATH  . [DISCONTINUED] vitamin B-12 (CYANOCOBALAMIN) 1000 MCG tablet Take 1,000 mcg by mouth daily.   No facility-administered encounter medications on file as of 05/07/2017.      REVIEW OF SYSTEMS  : All other systems reviewed and negative except where noted in the History of Present Illness.   PHYSICAL EXAM: BP 110/60 (BP Location: Left Arm, Patient Position: Sitting, Cuff Size: Normal)   Pulse 80   Ht 4\' 9"  (1.448 m) Comment: height measured without shoes  Wt 148 lb (67.1 kg)   LMP 09/02/2003   BMI 32.03 kg/m  General: Well developed white female in no acute distress Head: Normocephalic and atraumatic Eyes:  Sclerae anicteric, conjunctiva pink. Ears: Normal auditory acuity Lungs: Clear throughout to auscultation; no increased WOB. Heart: Regular rate and rhythm Abdomen: Soft, non-distended. Normal bowel sounds.  Scars noted from previous surgeries.  Also, incisional hernia noted on the right.  TTP in RLQ, also with referred pain to the RLQ when palpating in LLQ. Rectal:  Will be done at the time of colonoscopy. Musculoskeletal: Symmetrical with no gross deformities  Skin: No lesions on visible extremities Extremities: No edema  Neurological: Alert oriented x 4, grossly non-focal Psychological:  Alert and cooperative. Normal mood and affect  ASSESSMENT AND PLAN: *RLQ abdominal pain:  Unsure of the cause of her pain.  I have a feeling that this pain may be due to's scar tissue/adhesions from previous surgeries. There is some question as to whether her pain is related to her incisional hernia. Dr. Rosendo Gros is requesting colonoscopy prior to considering surgery/surgical  repair. *Constiaption:  I have encouraged her to begin using Miralax once daily, increase to twice daily if needed. *Chronic anticoagulation:  Hold Plavix for 5 days before procedure - will instruct when and how to resume after procedure. Risks and benefits of procedure including bleeding, perforation, infection, missed lesions, medication reactions and possible hospitalization or surgery if  complications occur explained. Additional rare but real risk of cardiovascular event such as heart attack or ischemia/infarct of other organs off of Plavix explained and need to seek urgent help if this occurs. Will communicate by phone or EMR with patient's prescribing provider, Dr. Martinique, that to confirm holding Plavix is reasonable in this case.  *Multiple medical problems, including EF of <35%, requiring colonoscopy to be performed at [facility]. *History of 26 cm mass in abdomen that was removed in 05/2015, required resection of sigmoid colon as well.   CC:  Tower, [name], MD  CC:  Dr. Rosendo Gros, [place]

## 2017-05-10 NOTE — Telephone Encounter (Signed)
LM for the patient on 05-10-2017.  Advised she can be off the Plavix 5 days prior to the procedure date of 9-4. She can resume it on 9-5.  Asked to call me to let me know she got this message.

## 2017-05-12 NOTE — Progress Notes (Signed)
Reviewed and agree with management plan.  Malcolm T. Stark, MD FACG 

## 2017-05-20 NOTE — Telephone Encounter (Signed)
Left another message on 05-10-2017.  Advised she can hold the Plavix on 05-10-2017 through 9-4 and she can resume the Plavix on 9-5 unless told otherwise.

## 2017-05-20 NOTE — Telephone Encounter (Signed)
See note from 05-20-2017 at 1:30 PM.

## 2017-05-20 NOTE — Telephone Encounter (Signed)
The patient called me back. She said she could not get anyone to bring her to the procedure at the hospital on 06-04-2017. She asked to reschedule. I let her know that the next hospital block is 10-16 at 8:30 am with Dr. Fuller Plan. She was fine with that.  I called and spoke to [place] at [facility]. She changed the appointment for me.  I called and let the patient know the time differences and she had her instructions with her so she was able to change the times I gave her for that day.

## 2017-06-06 ENCOUNTER — Ambulatory Visit (HOSPITAL_COMMUNITY)
Admission: RE | Admit: 2017-06-06 | Discharge: 2017-06-06 | Disposition: A | Discharge status: Home / Self Care | Payer: Medicare Other | Source: Ambulatory Visit | Attending: Internal Medicine | Admitting: Internal Medicine

## 2017-06-06 DIAGNOSIS — I779 Disorder of arteries and arterioles, unspecified: Secondary | ICD-10-CM | POA: Insufficient documentation

## 2017-06-06 DIAGNOSIS — I739 Peripheral vascular disease, unspecified: Secondary | ICD-10-CM

## 2017-06-06 DIAGNOSIS — I6523 Occlusion and stenosis of bilateral carotid arteries: Secondary | ICD-10-CM | POA: Diagnosis not present

## 2017-06-27 ENCOUNTER — Telehealth: Payer: Self-pay

## 2017-06-27 MED ORDER — BENZONATATE 200 MG PO CAPS: 200.0000 mg | ORAL_CAPSULE | Freq: Three times a day (TID) | ORAL | 1 refills | Status: DC | PRN

## 2017-06-27 MED ORDER — AMOXICILLIN-POT CLAVULANATE 875-125 MG PO TABS: 1.0000 | ORAL_TABLET | Freq: Two times a day (BID) | ORAL | 0 refills | Status: DC

## 2017-06-27 NOTE — Telephone Encounter (Signed)
Arville Go RN with [facility] saw pt today and pt has slight increase in congestion and SOB (more than baseline); pt also has intermittent prod cough with a thick tan, green phlegm.pt has coughed so much that pt is sore in her back and stomach. JoAnn request cb. [facility] [address].

## 2017-06-27 NOTE — Telephone Encounter (Signed)
I sent in augmentin to cover for bacterial bronchitis and tessalon for cough  Alert me if wheezing  Enc to quit smoking if she has not already  F/u if no imp

## 2017-06-27 NOTE — Telephone Encounter (Signed)
Left VM on JoAnn's phone letting her know Dr. Marliss Coots plan. Also called pt and advised her of Dr. Marliss Coots comments and instructions and she verbalized understanding

## 2017-06-28 ENCOUNTER — Telehealth: Payer: Self-pay

## 2017-06-28 NOTE — Telephone Encounter (Signed)
Then I would get robitussin DM over the counter and take as directed for cough

## 2017-06-28 NOTE — Telephone Encounter (Signed)
Margie notified of Dr. Marliss Coots comments

## 2017-06-28 NOTE — Telephone Encounter (Signed)
Jerald Kief nurse with [facility] called, patient picked up abx but not tessalon pearls bc it was too expensive pt is requesting something else.  [facility] [address]

## 2017-07-03 ENCOUNTER — Encounter: Payer: Self-pay | Admitting: Cardiovascular Disease

## 2017-07-03 ENCOUNTER — Ambulatory Visit (INDEPENDENT_AMBULATORY_CARE_PROVIDER_SITE_OTHER): Payer: Medicare Other | Admitting: Cardiovascular Disease

## 2017-07-03 VITALS — BP 146/76 | HR 74 | Ht <= 58 in | Wt 145.0 lb

## 2017-07-03 DIAGNOSIS — I6523 Occlusion and stenosis of bilateral carotid arteries: Secondary | ICD-10-CM

## 2017-07-03 DIAGNOSIS — I6350 Cerebral infarction due to unspecified occlusion or stenosis of unspecified cerebral artery: Secondary | ICD-10-CM

## 2017-07-03 NOTE — Assessment & Plan Note (Signed)
Diane [name] returns today for follow-up of her carotid Doppler studies performed 06/06/17 revealing significant progression of her right ICA stenosis now in the critical range compared to her previous Doppler performed 06/26/16. I did angiogram her on 11/15/15 revealed a 70% calcified right ICA stenosis. She has a type 1/2 arch. I believe she is a candidate for carotid artery stenting. I'm going to refer her to Dr. Trula Slade to discuss the procedure as well from a surgical perspective.

## 2017-07-03 NOTE — Patient Instructions (Signed)
Medication Instructions: Your physician recommends that you continue on your current medications as directed. Please refer to the Current Medication list given to you today.   Follow-Up: You have been referred to Dr. Trula Slade to discuss Carotid stenting. This appointment needs to be within the next 1-2 weeks.

## 2017-07-03 NOTE — Progress Notes (Signed)
07/03/2017 Diane [name]   [birth year]  [id number]  Primary Physician Tower, [name], MD Primary Cardiologist: Lorretta Harp MD Garret Reddish, [place], [place]  HPI:  Diane [name] is a 61 y.o.  Caucasian female with a history of CAD status post coronary artery bypass grafting March 2007 by Dr. [name]. I last saw her in the office by/2/18. She's had subsequent stenting and known occluded grafts except for her her LIMA. She has severe LV dysfunction with moderate pulmonary hypertension. I last saw her in the office 06/20/16. Her mother, Diane [name], was a long-term patient of mine as well. Her other Problems include diabetes, hypokalemia, hypertension and ongoing tobacco abuse. She's had progression of her bilateral carotid artery stenosis left greater than right . She is neurologically asymptomatic. She is high risk for carotid endarterectomy. I performed cerebral angiography and her 11/15/15 revealing 70% bilateral ICA stenosis with pins beyond the stenosis and significant calcification on the left making stenting not appropriate. Her carotid Dopplers performed on 06/26/16 showed stable bilateral moderate ICA stenosis and lower extremity Dopplers performed 12/12/16 revealed ABIs 0.93 bilaterally with moderate but not severe disease. She does complain of some leg pain which has some atypical characteristics for claudication Since I saw her in the office she's had carotid Dopplers studies performed 06/06/17 revealing significant progression of her right ICA stenosis. She is not a candidate for general anesthesia or carotid endarterectomy because of her severe LV dysfunction, ischemic heart disease and COPD.   No outpatient prescriptions have been marked as taking for the 07/03/17 encounter (Office Visit) with Lorretta Harp, MD.     Allergies  Allergen Reactions  . Bee Venom Itching and Swelling  . Entresto [Sacubitril-Valsartan] Cough  . Tetracycline     REACTION: reaction not known  . Ace  Inhibitors Cough    Social History   Social History  . Marital status: Divorced    Spouse name: N/A  . Number of children: 0  . Years of education: N/A   Occupational History  . disabled    Social History Main Topics  . Smoking status: Current Every Day Smoker    Packs/day: 1.50    Years: 50.00    Types: Cigarettes  . Smokeless tobacco: Never Used     Comment: 1.5 pack to 2 packs per day  . Alcohol use No  . Drug use: No  . Sexual activity: Not Currently    Partners: Male    Birth control/ protection: None   Other Topics Concern  . Not on file   Social History Narrative  . No narrative on file     Review of Systems: General: negative for chills, fever, night sweats or weight changes.  Cardiovascular: negative for chest pain, dyspnea on exertion, edema, orthopnea, palpitations, paroxysmal nocturnal dyspnea or shortness of breath Dermatological: negative for rash Respiratory: negative for cough or wheezing Urologic: negative for hematuria Abdominal: negative for nausea, vomiting, diarrhea, bright red blood per rectum, melena, or hematemesis Neurologic: negative for visual changes, syncope, or dizziness All other systems reviewed and are otherwise negative except as noted above.    Blood pressure (!) 146/76, pulse 74, height 4\' 10"  (1.473 m), weight 145 lb (65.8 kg), last menstrual period 09/02/2003.  General appearance: alert and no distress Neck: no adenopathy, no JVD, supple, symmetrical, trachea midline, thyroid not enlarged, symmetric, no tenderness/mass/nodules and Soft right carotid bruit Lungs: no adenopathy, no JVD, supple, symmetrical, trachea midline, thyroid not enlarged, symmetric, no tenderness/mass/nodules  and Soft right carotid bruit Heart: regular rate and rhythm, S1, S2 normal, no murmur, click, rub or gallop Extremities: extremities normal, atraumatic, no cyanosis or edema Pulses: 2+ and symmetric Skin: Skin color, texture, turgor normal. No rashes  or lesions Neurologic: Alert and oriented X 3, normal strength and tone. Normal symmetric reflexes. Normal coordination and gait  EKG sinus rhythm with 74 with poor progression and small inferior Q waves. Personally reviewed this EKG.  ASSESSMENT AND PLAN:   Cerebral artery occlusion with cerebral infarction [facility]) Ms. Galdamez returns today for follow-up of her carotid Doppler studies performed 06/06/17 revealing significant progression of her right ICA stenosis now in the critical range compared to her previous Doppler performed 06/26/16. I did angiogram her on 11/15/15 revealed a 70% calcified right ICA stenosis. She has a type 1/2 arch. I believe she is a candidate for carotid artery stenting. I'm going to refer her to Dr. Trula Slade to discuss the procedure as well from a surgical perspective.      Lorretta Harp MD FACP,FACC,FAHA, FSCAI 07/03/2017 1:05 PM

## 2017-07-05 ENCOUNTER — Other Ambulatory Visit: Payer: Self-pay

## 2017-07-05 DIAGNOSIS — I6523 Occlusion and stenosis of bilateral carotid arteries: Secondary | ICD-10-CM

## 2017-07-08 ENCOUNTER — Encounter (HOSPITAL_COMMUNITY): Payer: Self-pay | Admitting: *Deleted

## 2017-07-10 ENCOUNTER — Telehealth: Payer: Self-pay | Admitting: Pulmonary Disease

## 2017-07-10 MED ORDER — ALBUTEROL SULFATE HFA 108 (90 BASE) MCG/ACT IN AERS: INHALATION_SPRAY | RESPIRATORY_TRACT | 3 refills | Status: DC

## 2017-07-10 NOTE — Telephone Encounter (Signed)
Pt advised rx was sent to [facility]. Nothing further is needed.

## 2017-07-16 ENCOUNTER — Encounter (HOSPITAL_COMMUNITY): Payer: Self-pay | Admitting: *Deleted

## 2017-07-16 ENCOUNTER — Ambulatory Visit (HOSPITAL_COMMUNITY): Payer: Medicare Other | Admitting: Registered Nurse

## 2017-07-16 ENCOUNTER — Encounter (HOSPITAL_COMMUNITY)
Admission: RE | Disposition: A | Discharge status: Home / Self Care | Payer: Self-pay | Source: Ambulatory Visit | Attending: Gastroenterology

## 2017-07-16 ENCOUNTER — Ambulatory Visit (HOSPITAL_COMMUNITY)
Admission: RE | Admit: 2017-07-16 | Discharge: 2017-07-16 | Disposition: A | Discharge status: Home / Self Care | Payer: Medicare Other | Source: Ambulatory Visit | Attending: Gastroenterology | Admitting: Gastroenterology

## 2017-07-16 DIAGNOSIS — K219 Gastro-esophageal reflux disease without esophagitis: Secondary | ICD-10-CM | POA: Insufficient documentation

## 2017-07-16 DIAGNOSIS — K5900 Constipation, unspecified: Secondary | ICD-10-CM | POA: Diagnosis not present

## 2017-07-16 DIAGNOSIS — K5909 Other constipation: Secondary | ICD-10-CM | POA: Diagnosis not present

## 2017-07-16 DIAGNOSIS — Z951 Presence of aortocoronary bypass graft: Secondary | ICD-10-CM | POA: Diagnosis not present

## 2017-07-16 DIAGNOSIS — Z881 Allergy status to other antibiotic agents status: Secondary | ICD-10-CM | POA: Diagnosis not present

## 2017-07-16 DIAGNOSIS — I10 Essential (primary) hypertension: Secondary | ICD-10-CM | POA: Diagnosis not present

## 2017-07-16 DIAGNOSIS — E119 Type 2 diabetes mellitus without complications: Secondary | ICD-10-CM | POA: Diagnosis not present

## 2017-07-16 DIAGNOSIS — K649 Unspecified hemorrhoids: Secondary | ICD-10-CM | POA: Diagnosis not present

## 2017-07-16 DIAGNOSIS — F17210 Nicotine dependence, cigarettes, uncomplicated: Secondary | ICD-10-CM | POA: Insufficient documentation

## 2017-07-16 DIAGNOSIS — K640 First degree hemorrhoids: Secondary | ICD-10-CM | POA: Diagnosis not present

## 2017-07-16 DIAGNOSIS — I2510 Atherosclerotic heart disease of native coronary artery without angina pectoris: Secondary | ICD-10-CM | POA: Diagnosis not present

## 2017-07-16 DIAGNOSIS — J449 Chronic obstructive pulmonary disease, unspecified: Secondary | ICD-10-CM | POA: Insufficient documentation

## 2017-07-16 DIAGNOSIS — E785 Hyperlipidemia, unspecified: Secondary | ICD-10-CM | POA: Diagnosis not present

## 2017-07-16 DIAGNOSIS — R1031 Right lower quadrant pain: Secondary | ICD-10-CM | POA: Diagnosis not present

## 2017-07-16 DIAGNOSIS — Z980 Intestinal bypass and anastomosis status: Secondary | ICD-10-CM | POA: Diagnosis not present

## 2017-07-16 DIAGNOSIS — I6350 Cerebral infarction due to unspecified occlusion or stenosis of unspecified cerebral artery: Secondary | ICD-10-CM | POA: Diagnosis not present

## 2017-07-16 DIAGNOSIS — E1151 Type 2 diabetes mellitus with diabetic peripheral angiopathy without gangrene: Secondary | ICD-10-CM | POA: Diagnosis not present

## 2017-07-16 HISTORY — PX: COLONOSCOPY WITH PROPOFOL: SHX5780

## 2017-07-16 LAB — GLUCOSE, CAPILLARY: Glucose-Capillary: 149 mg/dL — ABNORMAL HIGH (ref 65–99)

## 2017-07-16 SURGERY — COLONOSCOPY WITH PROPOFOL
Anesthesia: Monitor Anesthesia Care

## 2017-07-16 MED ORDER — LACTATED RINGERS IV SOLN
INTRAVENOUS | Status: DC
  Administered 2017-07-16: 1000 mL via INTRAVENOUS

## 2017-07-16 MED ORDER — ONDANSETRON HCL 4 MG/2ML IJ SOLN
INTRAMUSCULAR | Status: AC
  Filled 2017-07-16: qty 2

## 2017-07-16 MED ORDER — PROPOFOL 500 MG/50ML IV EMUL
INTRAVENOUS | Status: DC | PRN
  Administered 2017-07-16: 100 ug/kg/min via INTRAVENOUS

## 2017-07-16 MED ORDER — PROPOFOL 10 MG/ML IV BOLUS
INTRAVENOUS | Status: AC
  Filled 2017-07-16: qty 40

## 2017-07-16 MED ORDER — DEXAMETHASONE SODIUM PHOSPHATE 10 MG/ML IJ SOLN
INTRAMUSCULAR | Status: AC
  Filled 2017-07-16: qty 1

## 2017-07-16 MED ORDER — SODIUM CHLORIDE 0.9 % IV SOLN: INTRAVENOUS | Status: DC

## 2017-07-16 MED ORDER — ONDANSETRON HCL 4 MG/2ML IJ SOLN
INTRAMUSCULAR | Status: DC | PRN
  Administered 2017-07-16: 4 mg via INTRAVENOUS

## 2017-07-16 MED ORDER — DEXAMETHASONE SODIUM PHOSPHATE 10 MG/ML IJ SOLN
INTRAMUSCULAR | Status: DC | PRN
  Administered 2017-07-16: 5 mg via INTRAVENOUS

## 2017-07-16 SURGICAL SUPPLY — 22 items

## 2017-07-16 NOTE — Op Note (Signed)
[facility] Patient Name: Diane [name] Procedure Date: 07/16/2017 MRN: [id number] Attending MD: Ladene Artist , MD Date of Birth: 07-May-1956 CSN: [id number] Age: 61 Admit Type: Outpatient Procedure:                Colonoscopy Indications:              Abdominal pain in the right lower quadrant,                            constipation. Providers:                Pricilla Riffle. Fuller Plan, MD, Cleda Daub, RN, Cherylynn Ridges, Technician, Enrigue Catena, CRNA Referring MD:              Medicines:                Monitored Anesthesia Care Complications:            No immediate complications. Estimated blood loss:                            None. Estimated Blood Loss:     Estimated blood loss: none. Procedure:                Pre-Anesthesia Assessment:                           - Prior to the procedure, a History and Physical                            was performed, and patient medications and                            allergies were reviewed. The patient's tolerance of                            previous anesthesia was also reviewed. The risks                            and benefits of the procedure and the sedation                            options and risks were discussed with the patient.                            All questions were answered, and informed consent                            was obtained. Prior Anticoagulants: The patient has                            taken Plavix (clopidogrel), last dose was 5 days                            prior to  procedure. ASA Grade Assessment: III - A                            patient with severe systemic disease. After                            reviewing the risks and benefits, the patient was                            deemed in satisfactory condition to undergo the                            procedure.                           After obtaining informed consent, the colonoscope   was passed under direct vision. Throughout the                            procedure, the patient's blood pressure, pulse, and                            oxygen saturations were monitored continuously. The                            EC-[id number] ([id number]) scope was introduced through                            the anus and advanced to the the cecum, identified                            by appendiceal orifice and ileocecal valve. The                            ileocecal valve, appendiceal orifice, and rectum                            were photographed. The quality of the bowel                            preparation was adequate. The colonoscopy was                            performed without difficulty. The patient tolerated                            the procedure well. Scope In: 9:04:15 AM Scope Out: 9:17:22 AM Scope Withdrawal Time: 0 hours 10 minutes 17 seconds  Total Procedure Duration: 0 hours 13 minutes 7 seconds  Findings:      The perianal and digital rectal examinations were normal.      There was evidence of a prior end-to-side colo-colonic anastomosis in       the sigmoid colon. This was patent and was characterized by healthy       appearing mucosa. The anastomosis was traversed.  Internal hemorrhoids were found during retroflexion. The hemorrhoids       were small and Grade I (internal hemorrhoids that do not prolapse).      The exam was otherwise without abnormality on direct and retroflexion       views. Impression:               - Patent end-to-side colo-colonic anastomosis,                            characterized by healthy appearing mucosa.                           - Internal hemorrhoids.                           - The examination was otherwise normal on direct                            and retroflexion views.                           - No specimens collected. Moderate Sedation:      N/A- Per Anesthesia Care Recommendation:           - Repeat colonoscopy in  10 years for screening                            purposes.                           - Resume Plavix (clopidogrel) today at prior dose.                            Refer to managing physician for further adjustment                            of therapy.                           - Patient has a contact number available for                            emergencies. The signs and symptoms of potential                            delayed complications were discussed with the                            patient. Return to normal activities tomorrow.                            Written discharge instructions were provided to the                            patient.                           -  Resume previous diet.                           - Continue present medications. Procedure Code(s):        --- Professional ---                           [phone], Colonoscopy, flexible; diagnostic, including                            collection of specimen(s) by brushing or washing,                            when performed (separate procedure) Diagnosis Code(s):        --- Professional ---                           Z98.0, Intestinal bypass and anastomosis status                           K64.0, First degree hemorrhoids                           R10.31, Right lower quadrant pain CPT copyright 2016 [employer]. All rights reserved. The codes documented in this report are preliminary and upon coder review may  be revised to meet current compliance requirements. Ladene Artist, MD 07/16/2017 9:23:50 AM This report has been signed electronically. Number of Addenda: 0

## 2017-07-16 NOTE — H&P (View-Only) (Signed)
07/03/2017 Diane [name]   06/08/1956  [id number]  Primary Physician Tower, [name], MD Primary Cardiologist: Lorretta Harp MD Garret Reddish, [place], [place]  HPI:  Diane [name] is a 61 y.o.  Caucasian female with a history of CAD status post coronary artery bypass grafting March 2007 by Dr. [name]. I last saw her in the office by/2/18. She's had subsequent stenting and known occluded grafts except for her her LIMA. She has severe LV dysfunction with moderate pulmonary hypertension. I last saw her in the office 06/20/16. Her mother, Diane [name], was a long-term patient of mine as well. Her other Problems include diabetes, hypokalemia, hypertension and ongoing tobacco abuse. She's had progression of her bilateral carotid artery stenosis left greater than right . She is neurologically asymptomatic. She is high risk for carotid endarterectomy. I performed cerebral angiography and her 11/15/15 revealing 70% bilateral ICA stenosis with pins beyond the stenosis and significant calcification on the left making stenting not appropriate. Her carotid Dopplers performed on 06/26/16 showed stable bilateral moderate ICA stenosis and lower extremity Dopplers performed 12/12/16 revealed ABIs 0.93 bilaterally with moderate but not severe disease. She does complain of some leg pain which has some atypical characteristics for claudication Since I saw her in the office she's had carotid Dopplers studies performed 06/06/17 revealing significant progression of her right ICA stenosis. She is not a candidate for general anesthesia or carotid endarterectomy because of her severe LV dysfunction, ischemic heart disease and COPD.   No outpatient prescriptions have been marked as taking for the 07/03/17 encounter (Office Visit) with Lorretta Harp, MD.     Allergies  Allergen Reactions  . Bee Venom Itching and Swelling  . Entresto [Sacubitril-Valsartan] Cough  . Tetracycline     REACTION: reaction not known  . Ace  Inhibitors Cough    Social History   Social History  . Marital status: Divorced    Spouse name: N/A  . Number of children: 0  . Years of education: N/A   Occupational History  . disabled    Social History Main Topics  . Smoking status: Current Every Day Smoker    Packs/day: 1.50    Years: 50.00    Types: Cigarettes  . Smokeless tobacco: Never Used     Comment: 1.5 pack to 2 packs per day  . Alcohol use No  . Drug use: No  . Sexual activity: Not Currently    Partners: Male    Birth control/ protection: None   Other Topics Concern  . Not on file   Social History Narrative  . No narrative on file     Review of Systems: General: negative for chills, fever, night sweats or weight changes.  Cardiovascular: negative for chest pain, dyspnea on exertion, edema, orthopnea, palpitations, paroxysmal nocturnal dyspnea or shortness of breath Dermatological: negative for rash Respiratory: negative for cough or wheezing Urologic: negative for hematuria Abdominal: negative for nausea, vomiting, diarrhea, bright red blood per rectum, melena, or hematemesis Neurologic: negative for visual changes, syncope, or dizziness All other systems reviewed and are otherwise negative except as noted above.    Blood pressure (!) 146/76, pulse 74, height 4\' 10"  (1.473 m), weight 145 lb (65.8 kg), last menstrual period 09/02/2003.  General appearance: alert and no distress Neck: no adenopathy, no JVD, supple, symmetrical, trachea midline, thyroid not enlarged, symmetric, no tenderness/mass/nodules and Soft right carotid bruit Lungs: no adenopathy, no JVD, supple, symmetrical, trachea midline, thyroid not enlarged, symmetric, no tenderness/mass/nodules  and Soft right carotid bruit Heart: regular rate and rhythm, S1, S2 normal, no murmur, click, rub or gallop Extremities: extremities normal, atraumatic, no cyanosis or edema Pulses: 2+ and symmetric Skin: Skin color, texture, turgor normal. No rashes  or lesions Neurologic: Alert and oriented X 3, normal strength and tone. Normal symmetric reflexes. Normal coordination and gait  EKG sinus rhythm with 74 with poor progression and small inferior Q waves. Personally reviewed this EKG.  ASSESSMENT AND PLAN:   Cerebral artery occlusion with cerebral infarction [facility]) [name] returns today for follow-up of her carotid Doppler studies performed 06/06/17 revealing significant progression of her right ICA stenosis now in the critical range compared to her previous Doppler performed 06/26/16. I did angiogram her on 11/15/15 revealed a 70% calcified right ICA stenosis. She has a type 1/2 arch. I believe she is a candidate for carotid artery stenting. I'm going to refer her to Dr. Trula Slade to discuss the procedure as well from a surgical perspective.      Lorretta Harp MD FACP,FACC,FAHA, FSCAI 07/03/2017 1:05 PM

## 2017-07-16 NOTE — Anesthesia Preprocedure Evaluation (Signed)
Anesthesia Evaluation  Patient identified by MRN, date of birth, ID band Patient awake    Reviewed: Allergy & Precautions, NPO status , Patient's Chart, lab work & pertinent test results  History of Anesthesia Complications (+) PONV  Airway Mallampati: I  TM Distance: >3 FB Neck ROM: Full    Dental   Pulmonary shortness of breath and with exertion, sleep apnea , COPD,  oxygen dependent, Current Smoker,    Pulmonary exam normal        Cardiovascular hypertension, Pt. on medications + Past MI, + Cardiac Stents and + CABG  Normal cardiovascular exam     Neuro/Psych Anxiety Depression CVA    GI/Hepatic GERD  Medicated and Controlled,  Endo/Other  diabetes, Type 2, Oral Hypoglycemic Agents  Renal/GU      Musculoskeletal   Abdominal   Peds  Hematology   Anesthesia Other Findings   Reproductive/Obstetrics                             Anesthesia Physical Anesthesia Plan  ASA: III  Anesthesia Plan: MAC   Post-op Pain Management:    Induction: Intravenous  PONV Risk Score and Plan: 2 and Ondansetron, Dexamethasone and Treatment may vary due to age or medical condition  Airway Management Planned: Simple Face Mask  Additional Equipment:   Intra-op Plan:   Post-operative Plan:   Informed Consent: I have reviewed the patients History and Physical, chart, labs and discussed the procedure including the risks, benefits and alternatives for the proposed anesthesia with the patient or authorized representative who has indicated his/her understanding and acceptance.     Plan Discussed with: CRNA and Surgeon  Anesthesia Plan Comments:         Anesthesia Quick Evaluation

## 2017-07-16 NOTE — Anesthesia Postprocedure Evaluation (Signed)
Anesthesia Post Note  Patient: Sissi Padia Napolitano  Procedure(s) Performed: COLONOSCOPY WITH PROPOFOL (N/A )     Patient location during evaluation: PACU Anesthesia Type: MAC Level of consciousness: awake and alert Pain management: pain level controlled Vital Signs Assessment: post-procedure vital signs reviewed and stable Respiratory status: spontaneous breathing, nonlabored ventilation, respiratory function stable and patient connected to nasal cannula oxygen Cardiovascular status: stable and blood pressure returned to baseline Postop Assessment: no apparent nausea or vomiting Anesthetic complications: no    Last Vitals:  Vitals:   07/16/17 0940 07/16/17 0950  BP: 124/68 131/79  Pulse: 76 77  Resp:    Temp:    SpO2: 94% 94%    Last Pain:  Vitals:   07/16/17 0925  TempSrc: Oral                 Emrys Mckamie DAVID

## 2017-07-16 NOTE — Interval H&P Note (Signed)
History and Physical Interval Note:  07/16/2017 8:50 AM  Diane [name]  has presented today for surgery, with the diagnosis of Constipation, RLQ abd pain  The various methods of treatment have been discussed with the patient and family. After consideration of risks, benefits and other options for treatment, the patient has consented to  Procedure(s): COLONOSCOPY WITH PROPOFOL (N/A) as a surgical intervention .  The patient's history has been reviewed, patient examined, no change in status, stable for surgery.  I have reviewed the patient's chart and labs.  Questions were answered to the patient's satisfaction.     Pricilla Riffle. Fuller Plan

## 2017-07-16 NOTE — Anesthesia Procedure Notes (Signed)
Procedure Name: MAC Date/Time: 07/16/2017 8:56 AM Performed by: Lissa Morales Pre-anesthesia Checklist: Patient identified, Emergency Drugs available, Suction available, Patient being monitored and Timeout performed Patient Re-evaluated:Patient Re-evaluated prior to induction Oxygen Delivery Method: Simple face mask Placement Confirmation: positive ETCO2

## 2017-07-16 NOTE — Discharge Instructions (Signed)

## 2017-07-16 NOTE — Anesthesia Postprocedure Evaluation (Signed)
Anesthesia Post Note  Patient: Diane [name]  Procedure(s) Performed: COLONOSCOPY WITH PROPOFOL (N/A )     Patient location during evaluation: PACU Anesthesia Type: MAC Level of consciousness: awake and alert Pain management: pain level controlled Vital Signs Assessment: post-procedure vital signs reviewed and stable Respiratory status: spontaneous breathing, nonlabored ventilation, respiratory function stable and patient connected to nasal cannula oxygen Cardiovascular status: stable and blood pressure returned to baseline Postop Assessment: no apparent nausea or vomiting Anesthetic complications: no    Last Vitals:  Vitals:   07/16/17 0940 07/16/17 0950  BP: 124/68 131/79  Pulse: 76 77  Resp:    Temp:    SpO2: 94% 94%    Last Pain:  Vitals:   07/16/17 0925  TempSrc: Oral                 Jyles Sontag DAVID     

## 2017-07-16 NOTE — Transfer of Care (Signed)
Immediate Anesthesia Transfer of Care Note  Patient: Diane [name]  Procedure(s) Performed: COLONOSCOPY WITH PROPOFOL (N/A )  Patient Location: PACU  Anesthesia Type:MAC  Level of Consciousness: awake, alert , oriented and patient cooperative  Airway & Oxygen Therapy: Patient Spontanous Breathing and Patient connected to face mask oxygen  Post-op Assessment: Report given to RN, Post -op Vital signs reviewed and stable and Patient moving all extremities X 4  Post vital signs: stable  Last Vitals:  Vitals:   07/16/17 0740 07/16/17 0925  BP: (!) 155/69   Pulse: 84   Resp: 14   Temp: 36.7 C 37.1 C  SpO2: 98%     Last Pain:  Vitals:   07/16/17 0925  TempSrc: Oral         Complications: No apparent anesthesia complications

## 2017-07-18 ENCOUNTER — Encounter (HOSPITAL_COMMUNITY): Payer: Self-pay | Admitting: Gastroenterology

## 2017-07-22 ENCOUNTER — Other Ambulatory Visit: Payer: Self-pay | Admitting: Cardiovascular Disease

## 2017-07-22 NOTE — Telephone Encounter (Signed)
REFILL 

## 2017-07-22 NOTE — Telephone Encounter (Signed)
This is a NL pt 

## 2017-08-19 ENCOUNTER — Ambulatory Visit (HOSPITAL_COMMUNITY)
Admission: RE | Admit: 2017-08-19 | Discharge: 2017-08-19 | Disposition: A | Discharge status: Home / Self Care | Payer: Medicare Other | Source: Ambulatory Visit | Attending: Surgery | Admitting: Surgery

## 2017-08-19 ENCOUNTER — Ambulatory Visit (INDEPENDENT_AMBULATORY_CARE_PROVIDER_SITE_OTHER): Payer: Medicare Other | Admitting: Surgery

## 2017-08-19 ENCOUNTER — Encounter: Payer: Self-pay | Admitting: Surgery

## 2017-08-19 VITALS — BP 125/75 | HR 66 | Temp 97.9°F | Resp 16 | Ht <= 58 in | Wt 146.0 lb

## 2017-08-19 DIAGNOSIS — I6523 Occlusion and stenosis of bilateral carotid arteries: Secondary | ICD-10-CM

## 2017-08-19 DIAGNOSIS — I6350 Cerebral infarction due to unspecified occlusion or stenosis of unspecified cerebral artery: Secondary | ICD-10-CM

## 2017-08-19 LAB — VAS US CAROTID
LEFT ECA DIAS: -27 cm/s
LEFT VERTEBRAL DIAS: 19 cm/s
Left CCA dist dias: -19 cm/s
Left CCA dist sys: -70 cm/s
Left CCA prox dias: 25 cm/s
Left CCA prox sys: 98 cm/s
Left ICA dist dias: 35 cm/s
Left ICA dist sys: 110 cm/s
Left ICA prox dias: 96 cm/s
Left ICA prox sys: 325 cm/s
RIGHT CCA MID DIAS: 19 cm/s
RIGHT ECA DIAS: 18 cm/s
Right CCA prox dias: 18 cm/s
Right CCA prox sys: 71 cm/s
Right cca dist sys: -102 cm/s

## 2017-08-19 NOTE — Progress Notes (Signed)
Vascular and Vein Specialist of [facility]  Patient name: Diane [name] MRN: [id number] DOB: [birth year] Sex: female   REQUESTING PROVIDER:    Dr. Gwenlyn Found   REASON FOR CONSULT:    Carotid stenosis  HISTORY OF PRESENT ILLNESS:   Diane [name] is a 61 y.o. female, who is referred today for evaluation of bilateral carotid stenosis, right greater than left.  The patient is asymptomatic.  Specifically, she denies numbness or weakness in either extremity.  She denies amaurosis fugax.  She denies dysarthria.  The patient has a history of coronary artery disease, status post CABG in 2007.  She has had subsequent stenting.  Patient suffers from diabetes, Which historically has not been well controlled.  She is medically managed for hypertension.  She takes a statin for hypercholesterolemia.  She is a current smoker.  She has undergone cervical neck surgery through an anterior approach on the left.  She states that her oxygen saturations dropped down in the middle of the night.  PAST MEDICAL HISTORY    Past Medical History:  Diagnosis Date  . Anemia   . Carotid stenosis   . CHF (congestive heart failure) ([place])   . Chronic bronchitis ([place])   . Colon polyps   . COPD (chronic obstructive pulmonary disease) ([place])   . Coronary artery disease    post CABG in 3/07 , coronary stents   . CVA (cerebral infarction) 1993  . Diabetes mellitus    type 2  . Dyslipidemia   . Fibromyalgia   . GERD (gastroesophageal reflux disease)   . Headache    hx of   . Hyperlipidemia   . Hypertension   . Myocardial infarction ([place])   . Pneumonia    hx of   . PONV (postoperative nausea and vomiting)    only once was patient sick in the 1980s   . Seizure ([place])   . Seizures ([place])    last seizure- 03/2013   . Shortness of breath dyspnea    with exertion or when fluid builds up   . Sleep apnea    used to wear a cpap- not used in 3 years   . Status post dilation of  esophageal narrowing   . Stroke [facility]) 1993   problems with balance   . Systolic murmur    known mild AS and MR  . Thyroid goiter   . Tobacco abuse      FAMILY HISTORY   Family History  Problem Relation Age of Onset  . Heart disease Mother   . Diabetes Mother   . COPD Mother   . Hyperlipidemia Mother   . Hypertension Mother   . Cancer Father        met, origin unknown  . Heart attack Father   . Drug abuse Paternal Grandmother   . Stroke Paternal Grandfather   . Lung cancer Paternal Aunt        lung with mets to brain  . Melanoma Paternal Uncle   . Lung cancer Paternal Uncle        lung/liver to brain  . Cancer Paternal Uncle        cancer of unknown type  . Heart disease Maternal Grandfather   . Hypertension Sister   . Cancer Sister        eyelid  . Glaucoma Sister   . Heart disease Maternal Aunt        x 2 aunts    SOCIAL HISTORY:   Social History  Socioeconomic History  . Marital status: Divorced    Spouse name: Not on file  . Number of children: 0  . Years of education: Not on file  . Highest education level: Not on file  Social Needs  . Financial resource strain: Not on file  . Food insecurity - worry: Not on file  . Food insecurity - inability: Not on file  . Transportation needs - medical: Not on file  . Transportation needs - non-medical: Not on file  Occupational History  . Occupation: disabled  Tobacco Use  . Smoking status: Current Every Day Smoker    Packs/day: 1.50    Years: 50.00    Pack years: 75.00    Types: Cigarettes  . Smokeless tobacco: Never Used  . Tobacco comment: 1.5 pack to 2 packs per day  Substance and Sexual Activity  . Alcohol use: No    Alcohol/week: 0.6 oz    Types: 1 Standard drinks or equivalent per week  . Drug use: No  . Sexual activity: Not Currently    Partners: Male    Birth control/protection: None  Other Topics Concern  . Not on file  Social History Narrative  . Not on file    ALLERGIES:     Allergies  Allergen Reactions  . Bee Venom Itching and Swelling  . Entresto [Sacubitril-Valsartan] Cough  . Tetracycline     Unknown   . Ace Inhibitors Cough    CURRENT MEDICATIONS:    Current Outpatient Medications  Medication Sig Dispense Refill  . acetaminophen (TYLENOL) 500 MG tablet Take 1,000 mg by mouth every 6 (six) hours as needed for moderate pain or headache.    . albuterol (VENTOLIN HFA) 108 (90 Base) MCG/ACT inhaler INHALE 2 PUFFS EVERY 6 HOURS AS NEEDED FOR WHEEZING/SHORTNESS OF BREATH 18 Inhaler 3  . aspirin EC 81 MG tablet Take 81 mg by mouth every evening.  30 tablet 6  . BEVESPI AEROSPHERE 9-4.8 MCG/ACT AERO INHALE 2 PUFFS INTO THE LUNGS 2 (TWO) TIMES DAILY. 10.7 Inhaler 5  . carvedilol (COREG) 25 MG tablet Take 1 tablet (25 mg total) by mouth 2 (two) times daily with a meal. 180 tablet 3  . clopidogrel (PLAVIX) 75 MG tablet Take 1 tablet (75 mg total) by mouth daily. 90 tablet 3  . cyclobenzaprine (FLEXERIL) 10 MG tablet Take 1 tablet (10 mg total) by mouth at bedtime. (Patient taking differently: Take 10 mg by mouth as needed for muscle spasms. ) 90 tablet 1  . dexlansoprazole (DEXILANT) 60 MG capsule TAKE 1 CAPSULE (60 MG TOTAL) BY MOUTH DAILY. (Patient taking differently: TAKE 1 CAPSULE (60 MG TOTAL) BY MOUTH DAILY IN THE EVENING) 30 capsule 11  . diphenhydrAMINE (BENADRYL) 25 mg capsule Take 2 capsules in AM and 1 capsule at night (Patient taking differently: Take 50 mg by mouth 2 (two) times daily as needed for itching or allergies. ) 270 capsule 3  . fluticasone (FLONASE) 50 MCG/ACT nasal spray PLACE 2 SPRAYS INTO BOTH NOSTRILS DAILY. 16 g 5  . furosemide (LASIX) 40 MG tablet Take 1 tablet (40 mg total) by mouth daily. 90 tablet 3  . gabapentin (NEURONTIN) 300 MG capsule Take 1 capsule (300 mg total) by mouth 3 (three) times daily. (Patient taking differently: Take 600 mg by mouth every evening. ) 270 capsule 3  . glimepiride (AMARYL) 2 MG tablet Take 4 mg by  mouth 2 (two) times daily.     [name] guaiFENesin (MUCINEX) 600 MG 12 hr tablet Take  2 tablets (1,200 mg total) by mouth 2 (two) times daily. 360 tablet 3  . hydrochlorothiazide (HYDRODIURIL) 50 MG tablet Take 1 tablet (50 mg total) by mouth daily. (Patient taking differently: Take 25 mg by mouth daily. ) 90 tablet 3  . isosorbide mononitrate (IMDUR) 30 MG 24 hr tablet Take 1 tablet (30 mg total) by mouth daily. 90 tablet 3  . levETIRAcetam (KEPPRA) 1000 MG tablet Take 1 tablet (1,000 mg total) by mouth 2 (two) times daily. 60 tablet 5  . losartan (COZAAR) 100 MG tablet Take 1 tablet (100 mg total) by mouth daily. 90 tablet 3  . nicotine (NICODERM CQ - DOSED IN MG/24 HOURS) 21 mg/24hr patch Place 21 mg onto the skin daily as needed (smoking cessation).     . nitroGLYCERIN (NITROLINGUAL) 0.4 MG/SPRAY spray PLACE 1 SPRAY UNDER THE TONGUE EVERY 5 (FIVE) MINUTES X 3 DOSES AS NEEDED FOR CHEST PAIN. 12 g 3  . ONE TOUCH ULTRA TEST test strip USE AS DIRECTED FOR TESTING BLOOD GLUCOSE 3 TIMES DAILY  1  . polyethylene glycol powder (GLYCOLAX/MIRALAX) powder Take 1 dose 17 grams in 8 oz of water daily as needed for constipation. 578 g 3  . potassium chloride (KLOR-CON M10) 10 MEQ tablet Take 1 tablet (10 mEq total) by mouth 2 (two) times daily. 180 tablet 3  . ranolazine (RANEXA) 1000 MG SR tablet Take 1 tablet (1,000 mg total) by mouth 2 (two) times daily. 180 tablet 3  . rosuvastatin (CRESTOR) 10 MG tablet TAKE 1 TABLET BY MOUTH DAILY 90 tablet 3  . sodium chloride (OCEAN) 0.65 % SOLN nasal spray Place 1 spray into both nostrils as needed for congestion.    . cetirizine (ZYRTEC) 10 MG tablet Take 10 mg by mouth daily as needed for allergies.     No current facility-administered medications for this visit.     REVIEW OF SYSTEMS:   '[X]'  denotes positive finding, '[ ]'  denotes negative finding Cardiac  Comments:  Chest pain or chest pressure: x   Shortness of breath upon exertion: x   Short of breath when lying  flat: x   Irregular heart rhythm:        Vascular    Pain in calf, thigh, or hip brought on by ambulation:    Pain in feet at night that wakes you up from your sleep:     Blood clot in your veins:    Leg swelling:  x       Pulmonary    Oxygen at home: x   Productive cough:  x   Wheezing:  x       Neurologic    Sudden weakness in arms or legs:  x   Sudden numbness in arms or legs:     Sudden onset of difficulty speaking or slurred speech:    Temporary loss of vision in one eye:     Problems with dizziness:  x       Gastrointestinal    Blood in stool:      Vomited blood:         Genitourinary    Burning when urinating:     Blood in urine:        Psychiatric    Major depression:         Hematologic    Bleeding problems:    Problems with blood clotting too easily:        Skin    Rashes or ulcers:  Constitutional    Fever or chills:     PHYSICAL EXAM:   Vitals:   08/19/17 1148 08/19/17 1150  BP: 126/75 125/75  Pulse: 66   Resp: 16   Temp: 97.9 F (36.6 C)   TempSrc: Oral   SpO2: 97%   Weight: 146 lb (66.2 kg)   Height: '4\' 10"'  (1.473 m)     GENERAL: The patient is a well-nourished female, in no acute distress. The vital signs are documented above. CARDIAC: There is a regular rate and rhythm.  VASCULAR: Left carotid bruit.  Palpable pedal pulses PULMONARY: Nonlabored respirations.  MUSCULOSKELETAL: There are no major deformities or cyanosis. NEUROLOGIC: No focal weakness or paresthesias are detected. SKIN: There are no ulcers or rashes noted. PSYCHIATRIC: The patient has a normal affect.  STUDIES:   I have ordered and reviewed her duplex.  She has 80-99% proximal right internal carotid stenosis.  There is a 60-79% (high end of range) left proximal internal carotid artery stenosis as well as a kink in the left internal carotid beyond the stenosis.  ASSESSMENT and PLAN   Asymptomatic bilateral carotid stenosis, right greater than left: The patient  is not a candidate for carotid endarterectomy because she cannot tolerate general anesthesia.  Therefore we will consider carotid stenting.  This will be coordinated with Dr. Alvester Chou.  She was told to continue her aspirin and Plavix.  She would like to wait until after January 1 before getting this done.  Risks and benefits were discussed for the procedure.   Annamarie Major, MD Vascular and Vein Specialists of [facility] [phone] Pager [phone]

## 2017-08-30 DIAGNOSIS — E119 Type 2 diabetes mellitus without complications: Secondary | ICD-10-CM | POA: Diagnosis not present

## 2017-08-30 DIAGNOSIS — E7800 Pure hypercholesterolemia, unspecified: Secondary | ICD-10-CM | POA: Diagnosis not present

## 2017-08-30 DIAGNOSIS — E1121 Type 2 diabetes mellitus with diabetic nephropathy: Secondary | ICD-10-CM | POA: Diagnosis not present

## 2017-08-30 DIAGNOSIS — I10 Essential (primary) hypertension: Secondary | ICD-10-CM | POA: Diagnosis not present

## 2017-08-30 DIAGNOSIS — I509 Heart failure, unspecified: Secondary | ICD-10-CM | POA: Diagnosis not present

## 2017-08-30 DIAGNOSIS — E041 Nontoxic single thyroid nodule: Secondary | ICD-10-CM | POA: Diagnosis not present

## 2017-09-03 ENCOUNTER — Other Ambulatory Visit: Payer: Self-pay | Admitting: Cardiology

## 2017-09-04 ENCOUNTER — Encounter: Payer: Self-pay | Admitting: *Deleted

## 2017-09-04 NOTE — Progress Notes (Signed)
Date arranged for Carotid stent with Zigmund Daniel at Dr. Kennon Holter office for 1/10/1@ 7:30am. Diane [name]([place]) has confirmed appointment also.

## 2017-09-05 ENCOUNTER — Other Ambulatory Visit: Payer: Self-pay | Admitting: *Deleted

## 2017-09-11 ENCOUNTER — Telehealth: Payer: Self-pay | Admitting: *Deleted

## 2017-09-11 NOTE — Telephone Encounter (Signed)
Call to patient and instructed to be at [facility] admitting department on 10/10/2017 at 5:30 am. NPO past MN night prior. Follow the detailed instructions she receives from the pre-admission testing department about this procedure. Plan to stay overnight, and have driver home. Verbalizes understanding to call back if any questions.

## 2017-09-13 ENCOUNTER — Other Ambulatory Visit: Payer: Self-pay | Admitting: Pulmonary Disease

## 2017-10-07 ENCOUNTER — Ambulatory Visit: Payer: Self-pay | Admitting: Neurology

## 2017-10-07 ENCOUNTER — Telehealth: Payer: Self-pay | Admitting: *Deleted

## 2017-10-07 NOTE — Telephone Encounter (Signed)
Patient called to review medications to take prior to procedure. Instructed to take heart, blood pressure and seizure medications with sips of water, hold all others. Do not take any diabetes medication or diuretics. States she has continued her Plavix and aspirin as instructed.

## 2017-10-10 ENCOUNTER — Inpatient Hospital Stay (HOSPITAL_COMMUNITY): Payer: Medicare Other

## 2017-10-10 ENCOUNTER — Inpatient Hospital Stay (HOSPITAL_COMMUNITY)
Admission: RE | Admit: 2017-10-10 | Discharge: 2017-10-11 | DRG: 038 | Disposition: A | Discharge status: Home / Self Care | Payer: Medicare Other | Source: Ambulatory Visit | Attending: Cardiovascular Disease | Admitting: Cardiovascular Disease

## 2017-10-10 ENCOUNTER — Encounter (HOSPITAL_COMMUNITY)
Admission: RE | Disposition: A | Discharge status: Home / Self Care | Payer: Self-pay | Source: Ambulatory Visit | Attending: Cardiovascular Disease

## 2017-10-10 DIAGNOSIS — Z888 Allergy status to other drugs, medicaments and biological substances status: Secondary | ICD-10-CM

## 2017-10-10 DIAGNOSIS — J449 Chronic obstructive pulmonary disease, unspecified: Secondary | ICD-10-CM | POA: Diagnosis present

## 2017-10-10 DIAGNOSIS — I6521 Occlusion and stenosis of right carotid artery: Secondary | ICD-10-CM | POA: Diagnosis present

## 2017-10-10 DIAGNOSIS — E785 Hyperlipidemia, unspecified: Secondary | ICD-10-CM | POA: Diagnosis present

## 2017-10-10 DIAGNOSIS — F17200 Nicotine dependence, unspecified, uncomplicated: Secondary | ICD-10-CM | POA: Diagnosis present

## 2017-10-10 DIAGNOSIS — F17210 Nicotine dependence, cigarettes, uncomplicated: Secondary | ICD-10-CM | POA: Diagnosis present

## 2017-10-10 DIAGNOSIS — G4733 Obstructive sleep apnea (adult) (pediatric): Secondary | ICD-10-CM | POA: Diagnosis present

## 2017-10-10 DIAGNOSIS — M797 Fibromyalgia: Secondary | ICD-10-CM | POA: Diagnosis present

## 2017-10-10 DIAGNOSIS — Z23 Encounter for immunization: Secondary | ICD-10-CM | POA: Diagnosis not present

## 2017-10-10 DIAGNOSIS — Z951 Presence of aortocoronary bypass graft: Secondary | ICD-10-CM

## 2017-10-10 DIAGNOSIS — I272 Pulmonary hypertension, unspecified: Secondary | ICD-10-CM | POA: Diagnosis present

## 2017-10-10 DIAGNOSIS — I10 Essential (primary) hypertension: Secondary | ICD-10-CM | POA: Diagnosis present

## 2017-10-10 DIAGNOSIS — Z7984 Long term (current) use of oral hypoglycemic drugs: Secondary | ICD-10-CM

## 2017-10-10 DIAGNOSIS — I5022 Chronic systolic (congestive) heart failure: Secondary | ICD-10-CM | POA: Diagnosis present

## 2017-10-10 DIAGNOSIS — Z7982 Long term (current) use of aspirin: Secondary | ICD-10-CM | POA: Diagnosis not present

## 2017-10-10 DIAGNOSIS — I2510 Atherosclerotic heart disease of native coronary artery without angina pectoris: Secondary | ICD-10-CM | POA: Diagnosis present

## 2017-10-10 DIAGNOSIS — D649 Anemia, unspecified: Secondary | ICD-10-CM | POA: Diagnosis present

## 2017-10-10 DIAGNOSIS — E119 Type 2 diabetes mellitus without complications: Secondary | ICD-10-CM | POA: Diagnosis present

## 2017-10-10 DIAGNOSIS — E876 Hypokalemia: Secondary | ICD-10-CM | POA: Diagnosis present

## 2017-10-10 DIAGNOSIS — Z881 Allergy status to other antibiotic agents status: Secondary | ICD-10-CM

## 2017-10-10 DIAGNOSIS — I739 Peripheral vascular disease, unspecified: Secondary | ICD-10-CM

## 2017-10-10 DIAGNOSIS — Z91030 Bee allergy status: Secondary | ICD-10-CM | POA: Diagnosis not present

## 2017-10-10 DIAGNOSIS — R569 Unspecified convulsions: Secondary | ICD-10-CM | POA: Diagnosis present

## 2017-10-10 DIAGNOSIS — I110 Hypertensive heart disease with heart failure: Secondary | ICD-10-CM | POA: Diagnosis present

## 2017-10-10 DIAGNOSIS — Z8673 Personal history of transient ischemic attack (TIA), and cerebral infarction without residual deficits: Secondary | ICD-10-CM

## 2017-10-10 DIAGNOSIS — E1169 Type 2 diabetes mellitus with other specified complication: Secondary | ICD-10-CM | POA: Diagnosis present

## 2017-10-10 DIAGNOSIS — I779 Disorder of arteries and arterioles, unspecified: Secondary | ICD-10-CM | POA: Diagnosis present

## 2017-10-10 DIAGNOSIS — Z79899 Other long term (current) drug therapy: Secondary | ICD-10-CM

## 2017-10-10 DIAGNOSIS — Z7902 Long term (current) use of antithrombotics/antiplatelets: Secondary | ICD-10-CM | POA: Diagnosis not present

## 2017-10-10 HISTORY — DX: Chronic systolic (congestive) heart failure: I50.22

## 2017-10-10 HISTORY — PX: CAROTID PTA/STENT INTERVENTION: CATH118231

## 2017-10-10 LAB — MRSA PCR SCREENING: MRSA by PCR: NEGATIVE

## 2017-10-10 LAB — POCT I-STAT, CHEM 8
BUN: 21 mg/dL — ABNORMAL HIGH (ref 6–20)
Calcium, Ion: 1.11 mmol/L — ABNORMAL LOW (ref 1.15–1.40)
Chloride: 99 mmol/L — ABNORMAL LOW (ref 101–111)
Creatinine, Ser: 0.8 mg/dL (ref 0.44–1.00)
Glucose, Bld: 104 mg/dL — ABNORMAL HIGH (ref 65–99)
HCT: 39 % (ref 36.0–46.0)
Hemoglobin: 13.3 g/dL (ref 12.0–15.0)
Potassium: 3.9 mmol/L (ref 3.5–5.1)
Sodium: 136 mmol/L (ref 135–145)
TCO2: 27 mmol/L (ref 22–32)

## 2017-10-10 LAB — CREATININE, SERUM
Creatinine, Ser: 0.9 mg/dL (ref 0.44–1.00)
GFR calc Af Amer: >60 mL/min (ref >60)
GFR calc non Af Amer: >60 mL/min (ref >60)

## 2017-10-10 LAB — POCT ACTIVATED CLOTTING TIME: Activated Clotting Time: 406 seconds

## 2017-10-10 LAB — CBC
HCT: 34.2 % — ABNORMAL LOW (ref 36.0–46.0)
Hemoglobin: 10.7 g/dL — ABNORMAL LOW (ref 12.0–15.0)
MCH: 26.3 pg (ref 26.0–34.0)
MCHC: 31.3 g/dL (ref 30.0–36.0)
MCV: 84 fL (ref 78.0–100.0)
Platelets: 196 10*3/uL (ref 150–400)
RBC: 4.07 MIL/uL (ref 3.87–5.11)
RDW: 16.3 % — ABNORMAL HIGH (ref 11.5–15.5)
WBC: 4.8 10*3/uL (ref 4.0–10.5)

## 2017-10-10 LAB — GLUCOSE, CAPILLARY: Glucose-Capillary: 226 mg/dL — ABNORMAL HIGH (ref 65–99)

## 2017-10-10 SURGERY — CAROTID PTA/STENT INTERVENTION
Anesthesia: LOCAL

## 2017-10-10 MED ORDER — ASPIRIN 81 MG PO CHEW
CHEWABLE_TABLET | ORAL | Status: AC
  Administered 2017-10-10: 81 mg via ORAL
  Filled 2017-10-10: qty 1

## 2017-10-10 MED ORDER — HYDRALAZINE HCL 20 MG/ML IJ SOLN: 5.0000 mg | INTRAMUSCULAR | Status: DC | PRN

## 2017-10-10 MED ORDER — SALINE SPRAY 0.65 % NA SOLN
1.0000 | NASAL | Status: DC | PRN
  Filled 2017-10-10: qty 44

## 2017-10-10 MED ORDER — ACETAMINOPHEN 325 MG PO TABS: 650.0000 mg | ORAL_TABLET | ORAL | Status: DC | PRN

## 2017-10-10 MED ORDER — GUAIFENESIN ER 600 MG PO TB12
1200.0000 mg | ORAL_TABLET | Freq: Two times a day (BID) | ORAL | Status: DC
  Administered 2017-10-10 – 2017-10-11 (×3): 1200 mg via ORAL
  Filled 2017-10-10 (×3): qty 2

## 2017-10-10 MED ORDER — NICOTINE 21 MG/24HR TD PT24: 21.0000 mg | MEDICATED_PATCH | Freq: Every day | TRANSDERMAL | Status: DC | PRN

## 2017-10-10 MED ORDER — SODIUM CHLORIDE 0.9 % IV SOLN
INTRAVENOUS | Status: DC | PRN
  Administered 2017-10-10: 1.75 mg/kg/h via INTRAVENOUS

## 2017-10-10 MED ORDER — SODIUM CHLORIDE 0.9 % IV SOLN: 250.0000 mL | INTRAVENOUS | Status: DC | PRN

## 2017-10-10 MED ORDER — OXYCODONE HCL 5 MG PO TABS
5.0000 mg | ORAL_TABLET | ORAL | Status: DC | PRN
  Administered 2017-10-10 (×4): 5 mg via ORAL
  Administered 2017-10-10: 10 mg via ORAL
  Administered 2017-10-11: 5 mg via ORAL
  Filled 2017-10-10: qty 1
  Filled 2017-10-10: qty 2
  Filled 2017-10-10 (×5): qty 1

## 2017-10-10 MED ORDER — ENOXAPARIN SODIUM 40 MG/0.4ML ~~LOC~~ SOLN
40.0000 mg | SUBCUTANEOUS | Status: DC
  Administered 2017-10-11: 40 mg via SUBCUTANEOUS
  Filled 2017-10-10: qty 0.4

## 2017-10-10 MED ORDER — CARVEDILOL 25 MG PO TABS
25.0000 mg | ORAL_TABLET | Freq: Two times a day (BID) | ORAL | Status: DC
  Administered 2017-10-11: 25 mg via ORAL
  Filled 2017-10-10: qty 1

## 2017-10-10 MED ORDER — LEVETIRACETAM 500 MG PO TABS
1000.0000 mg | ORAL_TABLET | Freq: Two times a day (BID) | ORAL | Status: DC
  Administered 2017-10-10 – 2017-10-11 (×2): 1000 mg via ORAL
  Filled 2017-10-10 (×2): qty 2

## 2017-10-10 MED ORDER — SODIUM CHLORIDE 0.9% FLUSH: 3.0000 mL | INTRAVENOUS | Status: DC | PRN

## 2017-10-10 MED ORDER — HEPARIN (PORCINE) IN NACL 2-0.9 UNIT/ML-% IJ SOLN
INTRAMUSCULAR | Status: AC | PRN
  Administered 2017-10-10: 1000 mL via INTRA_ARTERIAL

## 2017-10-10 MED ORDER — ALBUTEROL SULFATE (2.5 MG/3ML) 0.083% IN NEBU: 3.0000 mL | INHALATION_SOLUTION | RESPIRATORY_TRACT | Status: DC

## 2017-10-10 MED ORDER — IODIXANOL 320 MG/ML IV SOLN
INTRAVENOUS | Status: DC | PRN
  Administered 2017-10-10: 50 mL via INTRA_ARTERIAL

## 2017-10-10 MED ORDER — PANTOPRAZOLE SODIUM 40 MG PO TBEC
40.0000 mg | DELAYED_RELEASE_TABLET | Freq: Every day | ORAL | Status: DC
  Administered 2017-10-11: 40 mg via ORAL
  Filled 2017-10-10: qty 1

## 2017-10-10 MED ORDER — ONDANSETRON HCL 4 MG/2ML IJ SOLN
4.0000 mg | Freq: Four times a day (QID) | INTRAMUSCULAR | Status: DC | PRN
  Administered 2017-10-11: 4 mg via INTRAVENOUS
  Filled 2017-10-10: qty 2

## 2017-10-10 MED ORDER — FLUTICASONE PROPIONATE 50 MCG/ACT NA SUSP: 2.0000 | Freq: Every day | NASAL | Status: DC

## 2017-10-10 MED ORDER — ACETAMINOPHEN 500 MG PO TABS: 1000.0000 mg | ORAL_TABLET | Freq: Four times a day (QID) | ORAL | Status: DC | PRN

## 2017-10-10 MED ORDER — SODIUM CHLORIDE 0.9 % WEIGHT BASED INFUSION
1.0000 mL/kg/h | INTRAVENOUS | Status: AC
  Administered 2017-10-10: 1 mL/kg/h via INTRAVENOUS

## 2017-10-10 MED ORDER — FUROSEMIDE 40 MG PO TABS
40.0000 mg | ORAL_TABLET | ORAL | Status: DC
  Administered 2017-10-11: 40 mg via ORAL
  Filled 2017-10-10: qty 1

## 2017-10-10 MED ORDER — CLOPIDOGREL BISULFATE 75 MG PO TABS
75.0000 mg | ORAL_TABLET | Freq: Every day | ORAL | Status: DC
  Administered 2017-10-11: 75 mg via ORAL
  Filled 2017-10-10: qty 1

## 2017-10-10 MED ORDER — HYDROCHLOROTHIAZIDE 25 MG PO TABS
50.0000 mg | ORAL_TABLET | Freq: Every day | ORAL | Status: DC
  Filled 2017-10-10: qty 2

## 2017-10-10 MED ORDER — ATROPINE SULFATE 1 MG/10ML IJ SOSY
PREFILLED_SYRINGE | INTRAMUSCULAR | Status: AC
  Filled 2017-10-10: qty 10

## 2017-10-10 MED ORDER — POTASSIUM CHLORIDE CRYS ER 10 MEQ PO TBCR
10.0000 meq | EXTENDED_RELEASE_TABLET | Freq: Two times a day (BID) | ORAL | Status: DC
  Administered 2017-10-10 – 2017-10-11 (×3): 10 meq via ORAL
  Filled 2017-10-10 (×3): qty 1

## 2017-10-10 MED ORDER — NOREPINEPHRINE 4 MG/250ML-% IV SOLN
INTRAVENOUS | Status: AC
  Filled 2017-10-10: qty 250

## 2017-10-10 MED ORDER — BIVALIRUDIN BOLUS VIA INFUSION - CUPID
INTRAVENOUS | Status: DC | PRN
  Administered 2017-10-10: 49.35 mg via INTRAVENOUS

## 2017-10-10 MED ORDER — ASPIRIN 81 MG PO CHEW
81.0000 mg | CHEWABLE_TABLET | Freq: Once | ORAL | Status: AC
  Administered 2017-10-10: 81 mg via ORAL

## 2017-10-10 MED ORDER — MEPERIDINE HCL 25 MG/ML IJ SOLN: 6.2500 mg | INTRAMUSCULAR | Status: DC | PRN

## 2017-10-10 MED ORDER — GLIMEPIRIDE 4 MG PO TABS
4.0000 mg | ORAL_TABLET | Freq: Two times a day (BID) | ORAL | Status: DC
  Administered 2017-10-10 – 2017-10-11 (×2): 4 mg via ORAL
  Filled 2017-10-10 (×3): qty 1

## 2017-10-10 MED ORDER — NITROGLYCERIN 0.4 MG/SPRAY TL SOLN: 1.0000 | Status: DC | PRN

## 2017-10-10 MED ORDER — FUROSEMIDE 40 MG PO TABS
40.0000 mg | ORAL_TABLET | Freq: Every day | ORAL | Status: DC
  Filled 2017-10-10: qty 1

## 2017-10-10 MED ORDER — LABETALOL HCL 5 MG/ML IV SOLN: 10.0000 mg | INTRAVENOUS | Status: DC | PRN

## 2017-10-10 MED ORDER — ROSUVASTATIN CALCIUM 10 MG PO TABS
10.0000 mg | ORAL_TABLET | Freq: Every day | ORAL | Status: DC
  Administered 2017-10-10: 10 mg via ORAL
  Filled 2017-10-10 (×3): qty 1

## 2017-10-10 MED ORDER — ISOSORBIDE MONONITRATE ER 30 MG PO TB24
30.0000 mg | ORAL_TABLET | Freq: Every day | ORAL | Status: DC
  Administered 2017-10-11: 30 mg via ORAL
  Filled 2017-10-10 (×2): qty 1

## 2017-10-10 MED ORDER — LIDOCAINE HCL (PF) 1 % IJ SOLN
INTRAMUSCULAR | Status: DC | PRN
  Administered 2017-10-10: 25 mL

## 2017-10-10 MED ORDER — SODIUM CHLORIDE 0.9 % IV SOLN
INTRAVENOUS | Status: DC
  Administered 2017-10-10: 06:00:00 via INTRAVENOUS

## 2017-10-10 MED ORDER — GABAPENTIN 300 MG PO CAPS
300.0000 mg | ORAL_CAPSULE | Freq: Three times a day (TID) | ORAL | Status: DC
  Administered 2017-10-10 (×2): 300 mg via ORAL
  Filled 2017-10-10 (×4): qty 1

## 2017-10-10 MED ORDER — HEPARIN (PORCINE) IN NACL 2-0.9 UNIT/ML-% IJ SOLN
INTRAMUSCULAR | Status: AC
  Filled 2017-10-10: qty 500

## 2017-10-10 MED ORDER — ATROPINE SULFATE 1 MG/10ML IJ SOSY
PREFILLED_SYRINGE | INTRAMUSCULAR | Status: DC | PRN
  Administered 2017-10-10: 0.5 mg via INTRAVENOUS

## 2017-10-10 MED ORDER — LIDOCAINE HCL (PF) 1 % IJ SOLN
INTRAMUSCULAR | Status: AC
  Filled 2017-10-10: qty 30

## 2017-10-10 MED ORDER — ASPIRIN EC 81 MG PO TBEC: 81.0000 mg | DELAYED_RELEASE_TABLET | Freq: Every evening | ORAL | Status: DC

## 2017-10-10 MED ORDER — SODIUM CHLORIDE 0.9% FLUSH
3.0000 mL | Freq: Two times a day (BID) | INTRAVENOUS | Status: DC
  Administered 2017-10-10: 3 mL via INTRAVENOUS

## 2017-10-10 MED ORDER — BIVALIRUDIN TRIFLUOROACETATE 250 MG IV SOLR
INTRAVENOUS | Status: AC
  Filled 2017-10-10: qty 250

## 2017-10-10 MED ORDER — RANOLAZINE ER 500 MG PO TB12
1000.0000 mg | ORAL_TABLET | Freq: Two times a day (BID) | ORAL | Status: DC
  Administered 2017-10-10 – 2017-10-11 (×3): 1000 mg via ORAL
  Filled 2017-10-10 (×3): qty 2

## 2017-10-10 MED ORDER — HYDROMORPHONE HCL 1 MG/ML IJ SOLN: 0.2500 mg | INTRAMUSCULAR | Status: DC | PRN

## 2017-10-10 MED ORDER — ONDANSETRON HCL 4 MG/2ML IJ SOLN: 4.0000 mg | Freq: Once | INTRAMUSCULAR | Status: DC | PRN

## 2017-10-10 MED ORDER — HYDROCHLOROTHIAZIDE 50 MG PO TABS
50.0000 mg | ORAL_TABLET | ORAL | Status: DC
  Administered 2017-10-11: 50 mg via ORAL
  Filled 2017-10-10: qty 1
  Filled 2017-10-10: qty 2

## 2017-10-10 MED ORDER — CYCLOBENZAPRINE HCL 10 MG PO TABS: 10.0000 mg | ORAL_TABLET | Freq: Every day | ORAL | Status: DC

## 2017-10-10 MED ORDER — ALBUTEROL SULFATE (2.5 MG/3ML) 0.083% IN NEBU: 3.0000 mL | INHALATION_SOLUTION | RESPIRATORY_TRACT | Status: DC | PRN

## 2017-10-10 MED ORDER — DIPHENHYDRAMINE HCL 25 MG PO CAPS: 50.0000 mg | ORAL_CAPSULE | Freq: Two times a day (BID) | ORAL | Status: DC | PRN

## 2017-10-10 SURGICAL SUPPLY — 18 items
BALLN SAPPHIRE 3.0X20 (BALLOONS) ×2
BALLN VIATRAC 5.5X20X135 (BALLOONS) ×2
BALLOON SAPPHIRE 3.0X20 (BALLOONS) IMPLANT
BALLOON VIATRAC 5.5X20X135 (BALLOONS) IMPLANT
CATH ANGIO 5F JB1 100CM (CATHETERS) ×1 IMPLANT
DEVICE CONTINUOUS FLUSH (MISCELLANEOUS) ×1 IMPLANT
DEVICE EMBOSHIELD NAV6 4.0-7.0 (WIRE) ×1 IMPLANT
ELECT DEFIB PAD ADLT CADENCE (PAD) ×1 IMPLANT
KIT ENCORE 26 ADVANTAGE (KITS) ×1 IMPLANT
KIT PV (KITS) ×2 IMPLANT
SHEATH PINNACLE 6F 10CM (SHEATH) ×1 IMPLANT
SHEATH SHUTTLE SELECT 6F (SHEATH) ×1 IMPLANT
STENT XACT CAR 9X30X136 (Permanent Stent) ×1 IMPLANT
SYR MEDRAD MARK V 150ML (SYRINGE) ×2 IMPLANT
TRANSDUCER W/STOPCOCK (MISCELLANEOUS) ×2 IMPLANT
TRAY PV CATH (CUSTOM PROCEDURE TRAY) ×2 IMPLANT
WIRE AMPLATZ SSTIFF .035X260CM (WIRE) ×1 IMPLANT
WIRE HITORQ VERSACORE ST 145CM (WIRE) ×1 IMPLANT

## 2017-10-10 NOTE — Progress Notes (Signed)
12:15pm - R femoral sheath removed without complications. Pressure held for 30 min. Site unremarkable.  Dsg applied.  VSS and pt. Without pain. 2+ pedals present.

## 2017-10-10 NOTE — H&P (Signed)
Eileene, Kisling [birth year] [id number] 10/10/17  Primary Physician Tower, [name], MD Primary Cardiologist: Lorretta Harp MD Garret Reddish, [place], [place]  HPI:  CHINENYE KATZENBERGER is a 62 y.o.  Caucasian female with a history of CAD status post coronary artery bypass grafting March 2007 by Dr. [name]. I last saw her in the office by/2/18. She's had subsequent stenting and known occluded grafts except for her her LIMA. She has severe LV dysfunction with moderate pulmonary hypertension. I last saw her in the office 06/20/16. Her mother, [name], was a long-term patient of mine as well. Her other Problems include diabetes, hypokalemia, hypertension and ongoing tobacco abuse. She's had progression of her bilateral carotid artery stenosis left greater than right . She is neurologically asymptomatic. She is high risk for carotid endarterectomy. I performed cerebral angiography and her 11/15/15 revealing 70% bilateral ICA stenosis with pins beyond the stenosis and significant calcification on the left making stenting not appropriate. Her carotid Dopplers performed on 06/26/16 showed stable bilateral moderate ICA stenosis and lower extremity Dopplers performed 12/12/16 revealed ABIs 0.93 bilaterally with moderate but not severe disease. She does complain of some leg pain which has some atypical characteristics for claudication Since I saw her in the office she's had carotid Dopplers studies performed 06/06/17 revealing significant progression of her right ICA stenosis. She is not a candidate for general anesthesia or carotid endarterectomy because of her severe LV dysfunction, ischemic heart disease and COPD.   ActiveMedications  No outpatient prescriptions have been marked as taking for the 07/03/17 encounter (Office Visit) with Lorretta Harp, MD.            Allergies  Allergen Reactions  . Bee Venom Itching and Swelling  . Entresto [Sacubitril-Valsartan] Cough  . Tetracycline     REACTION:  reaction not known  . Ace Inhibitors Cough    Social History        Social History  . Marital status: Divorced    Spouse name: N/A  . Number of children: 0  . Years of education: N/A       Occupational History  . disabled          Social History Main Topics  . Smoking status: Current Every Day Smoker    Packs/day: 1.50    Years: 50.00    Types: Cigarettes  . Smokeless tobacco: Never Used     Comment: 1.5 pack to 2 packs per day  . Alcohol use No  . Drug use: No  . Sexual activity: Not Currently    Partners: Male    Birth control/ protection: None       Other Topics Concern  . Not on file      Social History Narrative  . No narrative on file     Review of Systems: General: negative for chills, fever, night sweats or weight changes.  Cardiovascular: negative for chest pain, dyspnea on exertion, edema, orthopnea, palpitations, paroxysmal nocturnal dyspnea or shortness of breath Dermatological: negative for rash Respiratory: negative for cough or wheezing Urologic: negative for hematuria Abdominal: negative for nausea, vomiting, diarrhea, bright red blood per rectum, melena, or hematemesis Neurologic: negative for visual changes, syncope, or dizziness All other systems reviewed and are otherwise negative except as noted above.    Blood pressure (!) 146/76, pulse 74, height 4\' 10"  (1.473 m), weight 145 lb (65.8 kg), last menstrual period 09/02/2003.  General appearance: alert and no distress Neck: no adenopathy, no JVD, supple, symmetrical, trachea midline, thyroid  not enlarged, symmetric, no tenderness/mass/nodules and Soft right carotid bruit Lungs: no adenopathy, no JVD, supple, symmetrical, trachea midline, thyroid not enlarged, symmetric, no tenderness/mass/nodules and Soft right carotid bruit Heart: regular rate and rhythm, S1, S2 normal, no murmur, click, rub or gallop Extremities: extremities normal, atraumatic, no cyanosis or  edema Pulses: 2+ and symmetric Skin: Skin color, texture, turgor normal. No rashes or lesions Neurologic: Alert and oriented X 3, normal strength and tone. Normal symmetric reflexes. Normal coordination and gait  EKG sinus rhythm with 74 with poor progression and small inferior Q waves. Personally reviewed this EKG.  ASSESSMENT AND PLAN:   Cerebral artery occlusion with cerebral infarction [facility]) Ms. Morfin returns today for follow-up of her carotid Doppler studies performed 06/06/17 revealing significant progression of her right ICA stenosis now in the critical range compared to her previous Doppler performed 06/26/16. I did angiogram her on 11/15/15 revealed a 70% calcified right ICA stenosis. She has a type 1/2 arch. I believe she is a candidate for carotid artery stenting. She saw Dr. Trula Slade at my request for consideration of endarterectomy and/or stenting. He did feel that she was not an endarterectomy candidate because of inability to undergo general anesthesia and agreed with right carotid stenting given progression of disease and bilaterality. She presents today for elective right internal carotid artery stenting     Lorretta Harp, M.D., FACP, [facility], [place], [place] [address] [place], [place]  [id number]  [phone] 10/10/2017 7:31 AM           Electronically signed by Lorretta Harp, MD at 07/03/2017 1:07 PM     Admission (Discharged) on 07/16/2017        Detailed Report

## 2017-10-10 NOTE — Op Note (Addendum)
    Patient name: Diane [name] MRN: [id number] DOB: 05/05/1956 Sex: female  10/10/2017 Pre-operative Diagnosis: Asymptomatic right carotid stenosis Post-operative diagnosis:  Same Surgeon:  Annamarie Major, Quay Burow Procedure Performed:  1.  Right femoral artery access  2.  Aortic arch angiogram  3.  Right carotid stent with distal embolic protection    Indications: The patient has developed progressive stenosis in her right carotid artery which remains asymptomatic.  She comes in today for carotid stenting as she is felt to be too high risk for surgical carotid endarterectomy  Procedure:  The patient was identified in the holding area and taken to room 8.  The patient was then placed supine on the table and prepped and draped in the usual sterile fashion.  A time out was called.  The right femoral artery was accessed with an 18-gauge needle.  An 035 wire was advanced without resistance and a 6 French sheath was placed.  Pigtail catheter was advanced into the aortic arch and an aortic arch angiogram was performed.  Next using a JB 1 catheter, the right carotid artery was selected and right carotid Angie Phillip Heal was performed with intracranial imaging.  Intracranial imaging will be separately dictated by the neuroradiology.  Findings:   Aortic arch: A type 1.5 aortic arch was visualized with no significant ostial great vessel stenosis.  Right carotid artery: A 95% calcified stenosis was identified at the right carotid bifurcation    Intervention: After the above images were acquired the decision was made to proceed with intervention.  A Amplatz superstiff wire was exchanged and left in the right carotid artery.  A 6 French 90 cm sheath was then advanced over the wire into the right common carotid artery.  Angiomax bolus and continuous infusion was then performed with a CT greater than 300.  Next, a large N wasAV 6 advanced across the lesion and placed in the distal internal carotid artery  just prior to a curve.  A 3 x 2 balloon was then used to predilate the lesion.  A 9 x 9 x 30 XACT stent was advanced across the lesion and then deployed.  It was postdilated with a 5.5 x 20 balloon.  Completion angiogram was then performed which showed near complete resolution of the stenosis with preservation of blood flow through the external carotid artery.  Additional intracranial imaging was then performed.  The filter was then removed.  The long sheath was exchanged out for a short [place] sheath.  The patient tolerated the procedure well and remained neurologically intact  Impression:  #1  Successful right carotid stenting with distal embolic protection for a heavily calcified 95% stenosis with completion stenosis less than 10%.    Theotis Burrow, M.D. [facility] and Vein Specialists of [place] Office: [phone] Pager:  [phone]

## 2017-10-10 NOTE — Interval H&P Note (Signed)
History and Physical Interval Note:  10/10/2017 7:52 AM  Diane [name]  has presented today for surgery, with the diagnosis of carotid stenosis  The various methods of treatment have been discussed with the patient and family. After consideration of risks, benefits and other options for treatment, the patient has consented to  Procedure(s): CAROTID PTA/STENT INTERVENTION - Right (N/A) as a surgical intervention .  The patient's history has been reviewed, patient examined, no change in status, stable for surgery.  I have reviewed the patient's chart and labs.  Questions were answered to the patient's satisfaction.     Quay Burow

## 2017-10-11 ENCOUNTER — Other Ambulatory Visit: Payer: Self-pay | Admitting: Physician Assistant

## 2017-10-11 ENCOUNTER — Other Ambulatory Visit: Payer: Self-pay | Admitting: Neurology

## 2017-10-11 ENCOUNTER — Encounter (HOSPITAL_COMMUNITY): Payer: Self-pay

## 2017-10-11 ENCOUNTER — Telehealth: Payer: Self-pay | Admitting: Surgery

## 2017-10-11 ENCOUNTER — Other Ambulatory Visit: Payer: Self-pay

## 2017-10-11 DIAGNOSIS — I6521 Occlusion and stenosis of right carotid artery: Principal | ICD-10-CM

## 2017-10-11 MED ORDER — OXYCODONE HCL 5 MG PO TABS: 5.0000 mg | ORAL_TABLET | ORAL | 0 refills | Status: DC | PRN

## 2017-10-11 MED ORDER — HYDROCHLOROTHIAZIDE 25 MG PO TABS: 25.0000 mg | ORAL_TABLET | ORAL | Status: DC

## 2017-10-11 MED ORDER — INFLUENZA VAC SPLIT QUAD 0.5 ML IM SUSY
0.5000 mL | PREFILLED_SYRINGE | Freq: Once | INTRAMUSCULAR | Status: AC
  Administered 2017-10-11: 0.5 mL via INTRAMUSCULAR
  Filled 2017-10-11: qty 0.5

## 2017-10-11 NOTE — Progress Notes (Signed)
10/11/2017 1250 Discharge AVS meds taken today and those due this evening reviewed.  Follow-up appointments and when to call md reviewed.  D/C IV and TELE.  Questions and concerns addressed.   D/C home per orders. [place]

## 2017-10-11 NOTE — Progress Notes (Addendum)
Progress Note  Patient Name: Diane [name] Date of Encounter: 10/11/2017  Primary Cardiologist: Dr. Gwenlyn Found  Subjective   Eager to go home. No issues with procedure site. Had one episode of n/v overnight, resolved. She clarifies that she is on HCTZ 25mg  daily not 50mg  due to prior hyponatrmia.  Inpatient Medications    Scheduled Meds: . aspirin EC  81 mg Oral QPM  . carvedilol  25 mg Oral BID WC  . clopidogrel  75 mg Oral Daily  . cyclobenzaprine  10 mg Oral QHS  . enoxaparin (LOVENOX) injection  40 mg Subcutaneous Q24H  . furosemide  40 mg Oral Q24H  . gabapentin  300 mg Oral TID  . glimepiride  4 mg Oral BID WC  . guaiFENesin  1,200 mg Oral BID  . hydrochlorothiazide  50 mg Oral Q24H  . isosorbide mononitrate  30 mg Oral Daily  . levETIRAcetam  1,000 mg Oral BID  . pantoprazole  40 mg Oral Daily  . potassium chloride  10 mEq Oral BID  . ranolazine  1,000 mg Oral BID  . rosuvastatin  10 mg Oral Daily  . sodium chloride flush  3 mL Intravenous Q12H   Continuous Infusions: . sodium chloride     PRN Meds: sodium chloride, acetaminophen, albuterol, hydrALAZINE, HYDROmorphone (DILAUDID) injection, labetalol, meperidine (DEMEROL) injection, nicotine, ondansetron (ZOFRAN) IV, ondansetron (ZOFRAN) IV, oxyCODONE, sodium chloride, sodium chloride flush   Vital Signs    Vitals:   10/10/17 2126 10/10/17 2326 10/11/17 0343 10/11/17 0400  BP: (!) 113/58 133/74 117/78   Pulse:  72 70   Resp:  16    Temp:  99.1 F (37.3 C) 98.3 F (36.8 C)   TempSrc:  Oral Oral   SpO2:  94% 96%   Weight:    150 lb 3.2 oz (68.1 kg)  Height:        Intake/Output Summary (Last 24 hours) at 10/11/2017 1101 Last data filed at 10/11/2017 0400 Gross per 24 hour  Intake 700.6 ml  Output 1 ml  Net 699.6 ml   Filed Weights   10/10/17 0549 10/10/17 2115 10/11/17 0400  Weight: 145 lb (65.8 kg) 152 lb 5.4 oz (69.1 kg) 150 lb 3.2 oz (68.1 kg)    Telemetry    NSR - Personally  Reviewed  Physical Exam   GEN: No acute distress, obese WF.  HEENT: Normocephalic, atraumatic, sclera non-icteric. Neck: No JVD or bruits. Cardiac: RRR no murmurs, rubs, or gallops.  Radials/DP/PT 1+ and equal bilaterally.  Respiratory: Coarse but clear to auscultation bilaterally. Breathing is unlabored. GI: Soft, nontender, non-distended, BS +x 4. MS: no deformity. Extremities: No clubbing or cyanosis. No edema. Distal pedal pulses are 2+ and equal bilaterally. Right groin cath site without hematoma, ecchymosis, or bruit. Neuro:  AAOx3. Follows commands. Psych:  Responds to questions appropriately with a normal affect.  Labs    Chemistry Recent Labs  Lab 10/10/17 0619 10/10/17 1911  NA 136  --   K 3.9  --   CL 99*  --   GLUCOSE 104*  --   BUN 21*  --   CREATININE 0.80 0.90  GFRNONAA  --  >60  GFRAA  --  >60     Hematology Recent Labs  Lab 10/10/17 0619 10/10/17 1911  WBC  --  4.8  RBC  --  4.07  HGB 13.3 10.7*  HCT 39.0 34.2*  MCV  --  84.0  MCH  --  26.3  MCHC  --  31.3  RDW  --  16.3*  PLT  --  196    Radiology    Ct Head Wo Contrast  Result Date: 10/10/2017 CLINICAL DATA:  Seizure and headache.  Prior stroke. EXAM: CT HEAD WITHOUT CONTRAST TECHNIQUE: Contiguous axial images were obtained from the base of the skull through the vertex without intravenous contrast. COMPARISON:  MRI of the brain on 12/08/2015 and CT of the head on 03/14/2013 FINDINGS: Brain: Stable prior right parietal infarct with stable region of encephalomalacia. The brain demonstrates no evidence of hemorrhage, acute infarction, edema, mass effect, extra-axial fluid collection, hydrocephalus or mass lesion. Vascular: No hyperdense vessel or unexpected calcification. Skull: Normal. Negative for fracture or focal lesion. Sinuses/Orbits: No acute finding. Other: None. IMPRESSION: Stable old right parietal lobe infarct.  No acute findings. Electronically Signed   By: Aletta Edouard M.D.   On:  10/10/2017 17:57    Patient Profile     62 y.o. female with CAD s/p CABG 2060, chronic systolic CHF, moderate pulmonary HTN, DM, HTN, ongoing tobacco abuse, COPD, CVA, dyslipidemia, fibromyalgia, remote seizures, OSA admitted for planned carotid stenting. Recent carotid duplex 06/2017 showed progression of R ICA stenosis. She was not felt to be a candidate for general anesthesia or carotid endarterectomy because of her severe LV dysfunction, ischemic heart disease and COPD.   Assessment & Plan    1. Carotid artery disease - in conjunction with vascular surgery team, underwent right carotid stent with distal embolic protection without any acute complications. Surgical site looks good. Will review f/u plan with MD.  2. HTN - pt clarifies that she is on HCTZ 25mg  not 50mg . Med rec already completed by vascular surgery; will adjust on AVS.  3. Hyperlipidemia - continue current regimen.  4. Anemia - prior Hgb 14-15 range, will review with MD.  For questions or updates, please contact [place] Please consult [email or web address] for contact info under Cardiology/STEMI.  Signed, Charlie Pitter, PA-C 10/11/2017, 11:01 AM    Patient seen, examined. Available data reviewed. Agree with findings, assessment, and plan as outlined by Melina Copa, PA-C.  The patient underwent uncomplicated carotid stenting yesterday.  She appears stable this morning.  Her heart is regular rate and rhythm, lungs are clear, and right groin site is clear with no evidence of hematoma or ecchymoses.  Her follow-up is arranged with Dr. Gwenlyn Found and she will have a carotid duplex scan when she returns to see him as an outpatient in a few weeks.  The patient's hemoglobin decreased from 13.3-10.7 but with unremarkable exam and no associated symptoms.  Suspect this is related to procedural blood loss and hemodilution.  Sherren Mocha, M.D. 10/11/2017 11:24 AM

## 2017-10-11 NOTE — Care Management Note (Signed)
Case Management Note Marvetta Gibbons RN, BSN Unit 4E-Case Manager [phone]  Patient Details  Name: Diane [name] MRN: [id number] Date of Birth: Jan 23, 1956  Subjective/Objective:   Pt admitted s/p right femoral artery access for right carotid stent               Action/Plan: PTA pt lived at home- plan to return home- no CM needs noted for transition home  Expected Discharge Date:  10/11/17               Expected Discharge Plan:  Home/Self Care  In-House Referral:  NA  Discharge planning Services  CM Consult  Post Acute Care Choice:  NA Choice offered to:     DME Arranged:    DME Agency:     HH Arranged:    [place] Agency:     Status of Service:  Completed, signed off  If discussed at [place] of Stay Meetings, dates discussed:    Discharge Disposition: home/self care   Additional Comments:  Dawayne Patricia, RN 10/11/2017, 9:56 AM

## 2017-10-11 NOTE — Telephone Encounter (Signed)
-----   Message from Willy Eddy, RN sent at 10/11/2017  8:40 AM EST -----   ----- Message ----- From: Ulyses Amor, PA-C Sent: 10/11/2017   7:58 AM To: [facility] Charge Pool  F/U with Dr. Trula Slade in 2-3 weeks s/p carotid stent

## 2017-10-11 NOTE — Progress Notes (Addendum)
[facility]  Subjective  - No post op problems until this am and she had one bout of nausea with vomiting.  Zofran was given.     Objective 117/78 70 98.3 F (36.8 C) (Oral) 16 96%  Intake/Output Summary (Last 24 hours) at 10/11/2017 0733 Last data filed at 10/11/2017 0400 Gross per 24 hour  Intake 1158.89 ml  Output 1 ml  Net 1157.89 ml    Right groin stick site soft without hematoma Palpable DP pulses B No vision changes, B grips 5/5 equal B Heart RRR Lungs non labored breathing  Assessment/Planning: POD # 1 right femoral artery access for right carotid stent  Patient comfortable this am after receiving Zofran.   Will monitor her for further nausea She has ambulated and voided. If she tolerates PO's later today and the  nausea and vomiting resolve she may be discharged home later. Patient is on Plavix and 81 mg ASA I scheduled a f/u with Dr. Trula Slade in 2-3 weeks.  I did not order follow carotid duplex because she is being followed by Dr. Gwenlyn Found as an out patient.   Roxy Horseman 10/11/2017 7:33 AM --  Laboratory Lab Results: Recent Labs    10/10/17 0619 10/10/17 1911  WBC  --  4.8  HGB 13.3 10.7*  HCT 39.0 34.2*  PLT  --  196   BMET Recent Labs    10/10/17 0619 10/10/17 1911  NA 136  --   K 3.9  --   CL 99*  --   GLUCOSE 104*  --   BUN 21*  --   CREATININE 0.80 0.90    COAG Lab Results  Component Value Date   INR 1.05 11/10/2015   INR 0.96 05/02/2015   INR 0.99 03/02/2013   No results found for: PTT

## 2017-10-11 NOTE — Discharge Summary (Signed)
Discharge Summary    Patient ID: Diane [name],  MRN: [id number], DOB/AGE: 1956-09-10 62 y.o.  Admit date: 10/10/2017 Discharge date: 10/11/2017  Primary Care Provider: Loura Pardon A Primary Cardiologist: Dr. Gwenlyn Found  Discharge Diagnoses    Principal Problem:   Carotid stenosis Active Problems:   Carotid artery disease ([place])   Hyperlipidemia   TOBACCO USE   Essential hypertension   Coronary atherosclerosis   History of CVA (cerebrovascular accident)    Diagnostic Studies/Procedures    10/10/2017 Pre-operative Diagnosis: Asymptomatic right carotid stenosis Post-operative diagnosis:  Same Surgeon:  Milderd Meager Procedure Performed:               1.  Right femoral artery access               2.  Aortic arch angiogram               3.  Right carotid stent with distal embolic protection    Indications: The patient has developed progressive stenosis in her right carotid artery which remains asymptomatic.  She comes in today for carotid stenting as she is felt to be too high risk for surgical carotid endarterectomy  Procedure:  The patient was identified in the holding area and taken to room 8.  The patient was then placed supine on the table and prepped and draped in the usual sterile fashion.  A time out was called.  The right femoral artery was accessed with an 18-gauge needle.  An 035 wire was advanced without resistance and a 6 French sheath was placed.  Pigtail catheter was advanced into the aortic arch and an aortic arch angiogram was performed.  Next using a JB 1 catheter, the right carotid artery was selected and right carotid Angie Phillip Heal was performed with intracranial imaging.  Intracranial imaging will be separately dictated by the neuroradiology.  Findings:                Aortic arch: A type 1.5 aortic arch was visualized with no significant ostial great vessel stenosis.               Right carotid artery: A 95% calcified stenosis was  identified at the right carotid bifurcation                 Intervention: After the above images were acquired the decision was made to proceed with intervention.  A Amplatz superstiff wire was exchanged and left in the right carotid artery.  A 6 French 90 cm sheath was then advanced over the wire into the right common carotid artery.  Angiomax bolus and continuous infusion was then performed with a CT greater than 300.  Next, a large N wasAV 6 advanced across the lesion and placed in the distal internal carotid artery just prior to a curve.  A 3 x 2 balloon was then used to predilate the lesion.  A 9 x 9 x 30 XACT stent was advanced across the lesion and then deployed.  It was postdilated with a 5.5 x 20 balloon.  Completion angiogram was then performed which showed near complete resolution of the stenosis with preservation of blood flow through the external carotid artery.  Additional intracranial imaging was then performed.  The filter was then removed.  The long sheath was exchanged out for a short [place] sheath.  The patient tolerated the procedure well and remained neurologically intact  Impression:               #  1  Successful right carotid stenting with distal embolic protection for a heavily calcified 95% stenosis with completion stenosis less than 10%.                 Theotis Burrow, M.D. [facility] Office: [phone] Pager:  [phone]   _____________     History of Present Illness     Diane [name] is a 62 y.o. female with history of CAD s/p CABG 6269, chronic systolic CHF, moderate pulmonary HTN, DM, HTN, ongoing tobacco abuse, COPD, CVA, dyslipidemia, fibromyalgia, remote seizures, OSA admitted for planned carotid stenting. Recent carotid duplex 06/2017 showed progression of R ICA stenosis. She was not felt to be a candidate for general anesthesia or carotid endarterectomy because of her severe LV dysfunction, ischemic heart disease and  COPD.    Hospital Course    1. Carotid artery disease - in conjunction with vascular surgery team, underwent right carotid stent with distal embolic protection without any acute complications. Surgical site looks good. Clarified with Dr. Gwenlyn Found no need to f/u with Dr. Trula Slade - sent message to vascular PA to help facilitate cancellation. Have arranged f/u carotid duplex in 1 week and appointment with Dr. Gwenlyn Found in 2-3 weeks. Vascular surgery APP completed med rec and rx'd oxycodone.  2. HTN - pt clarifies that she is on HCTZ 25mg  not 50mg . Med rec already completed by vascular surgery; will adjust on AVS.  3. Hyperlipidemia - continue current regimen.  4. Anemia - prior Hgb 13, will review with MD. This is felt due to procedural related loss. Groin site looks fine and she's not had any other obvious bleeding. This can be followed up at next visit.  5. Diabetes mellitus - she was advised to hold Metformin for 48 hours post-cath, to resume AM of 10/13/17.  Consultants: Vascular surgery _____________  Discharge Vitals Blood pressure 117/78, pulse 70, temperature 98.3 F (36.8 C), temperature source Oral, resp. rate 16, height 4\' 10"  (1.473 m), weight 150 lb 3.2 oz (68.1 kg), last menstrual period 09/02/2003, SpO2 96 %.  Filed Weights   10/10/17 0549 10/10/17 2115 10/11/17 0400  Weight: 145 lb (65.8 kg) 152 lb 5.4 oz (69.1 kg) 150 lb 3.2 oz (68.1 kg)    Labs & Radiologic Studies    CBC Recent Labs    10/10/17 0619 10/10/17 1911  WBC  --  4.8  HGB 13.3 10.7*  HCT 39.0 34.2*  MCV  --  84.0  PLT  --  485   Basic Metabolic Panel Recent Labs    10/10/17 0619 10/10/17 1911  NA 136  --   K 3.9  --   CL 99*  --   GLUCOSE 104*  --   BUN 21*  --   CREATININE 0.80 0.90   _____________  Ct Head Wo Contrast  Result Date: 10/10/2017 CLINICAL DATA:  Seizure and headache.  Prior stroke. EXAM: CT HEAD WITHOUT CONTRAST TECHNIQUE: Contiguous axial images were obtained from the base of  the skull through the vertex without intravenous contrast. COMPARISON:  MRI of the brain on 12/08/2015 and CT of the head on 03/14/2013 FINDINGS: Brain: Stable prior right parietal infarct with stable region of encephalomalacia. The brain demonstrates no evidence of hemorrhage, acute infarction, edema, mass effect, extra-axial fluid collection, hydrocephalus or mass lesion. Vascular: No hyperdense vessel or unexpected calcification. Skull: Normal. Negative for fracture or focal lesion. Sinuses/Orbits: No acute finding. Other: None. IMPRESSION: Stable old right parietal lobe infarct.  No acute findings. Electronically Signed   By: Aletta Edouard M.D.   On: 10/10/2017 17:57   Disposition   Pt is being discharged home today in good condition.  Follow-up Plans & Appointments    Follow-up Information    [facility] Follow up.   Specialty:  Cardiology Why:  [facility] - 10/18/17 at 11am for carotid ultrasound Contact information: [address] [place] [phone]       Lorretta Harp, MD Follow up.   Specialties:  Cardiology, Radiology Why:  [facility] - 10/29/17 at 8:45am. Arrive 15 minutes prior to appointment to check in. Dr. Gwenlyn Found agrees that you do not need to see Dr. Trula Slade. We will send a message to the vascular team/Dr. Brabham's office to cancel this appointment. Contact information: [address] [place] [address] [phone]          Discharge Instructions    Call MD for:  redness, tenderness, or signs of infection (pain, swelling, bleeding, redness, odor or green/yellow discharge around incision site)   Complete by:  As directed    Call MD for:  severe or increased pain, loss or decreased feeling  in affected limb(s)   Complete by:  As directed    Call MD for:  temperature >100.5   Complete by:  As directed    Diet - low sodium heart healthy   Complete by:  As  directed    Discharge instructions   Complete by:  As directed    You may shower daily.   Discharge instructions   Complete by:  As directed    Do not take any metformin for 48 hours after procedure. You may restart the morning of 10/13/17.  Dr. Gwenlyn Found agrees you do not need to see Dr. Trula Slade in follow-up. We have sent a message to his PA to help cancel this appointment. Followup has been arranged with Dr. Gwenlyn Found.   Increase activity slowly   Complete by:  As directed    No driving for 2 days. No lifting over 5 lbs for 1 week. No sexual activity for 1 week. Keep procedure site clean & dry. If you notice increased pain, swelling, bleeding or pus, call/return!  You may shower, but no soaking baths/hot tubs/pools for 1 week.   Resume previous diet   Complete by:  As directed       Discharge Medications   Allergies as of 10/11/2017      Reactions   Bee Venom Itching, Swelling   Entresto [sacubitril-valsartan] Cough   Tetracycline    Unknown    Ace Inhibitors Cough      Medication List    TAKE these medications   acetaminophen 500 MG tablet Commonly known as:  TYLENOL Take 1,000 mg by mouth every 6 (six) hours as needed for moderate pain or headache.   albuterol 108 (90 Base) MCG/ACT inhaler Commonly known as:  VENTOLIN HFA INHALE 2 PUFFS EVERY 6 HOURS AS NEEDED FOR WHEEZING/SHORTNESS OF BREATH   aspirin EC 81 MG tablet Take 81 mg by mouth every evening.   BEVESPI AEROSPHERE 9-4.8 MCG/ACT Aero Generic drug:  Glycopyrrolate-Formoterol TAKE 2 PUFFS BY MOUTH TWICE A DAY   carvedilol 25 MG tablet Commonly known as:  COREG Take 1 tablet (25 mg total) by mouth 2 (two) times daily with a meal.   cetirizine 10 MG tablet Commonly known as:  ZYRTEC Take 10 mg by mouth daily as  needed for allergies.   clopidogrel 75 MG tablet Commonly known as:  PLAVIX TAKE 1 TABLET BY MOUTH EVERY DAY   cyclobenzaprine 10 MG tablet Commonly known as:  FLEXERIL Take 1 tablet (10 mg total) by  mouth at bedtime. What changed:    when to take this  reasons to take this   dexlansoprazole 60 MG capsule Commonly known as:  DEXILANT TAKE 1 CAPSULE (60 MG TOTAL) BY MOUTH DAILY. What changed:  additional instructions   diphenhydrAMINE 25 mg capsule Commonly known as:  BENADRYL Take 2 capsules in AM and 1 capsule at night What changed:    how much to take  how to take this  when to take this  reasons to take this  additional instructions   fluticasone 50 MCG/ACT nasal spray Commonly known as:  FLONASE PLACE 2 SPRAYS INTO BOTH NOSTRILS DAILY. What changed:    when to take this  reasons to take this   furosemide 40 MG tablet Commonly known as:  LASIX Take 1 tablet (40 mg total) by mouth daily.   gabapentin 300 MG capsule Commonly known as:  NEURONTIN Take 1 capsule (300 mg total) by mouth 3 (three) times daily. What changed:    how much to take  when to take this   glimepiride 4 MG tablet Commonly known as:  AMARYL Take 4 mg by mouth 2 (two) times daily.   guaiFENesin 600 MG 12 hr tablet Commonly known as:  MUCINEX Take 2 tablets (1,200 mg total) by mouth 2 (two) times daily.   hydrochlorothiazide 50 MG tablet Commonly known as:  HYDRODIURIL Take 25 mg by mouth daily.   isosorbide mononitrate 30 MG 24 hr tablet Commonly known as:  IMDUR Take 1 tablet (30 mg total) by mouth daily.   levETIRAcetam 1000 MG tablet Commonly known as:  KEPPRA Take 1 tablet (1,000 mg total) by mouth 2 (two) times daily.   losartan 100 MG tablet Commonly known as:  COZAAR Take 1 tablet (100 mg total) by mouth daily.   metFORMIN 500 MG 24 hr tablet Commonly known as:  GLUCOPHAGE-XR Take 500 mg by mouth 2 (two) times daily.   nicotine 21 mg/24hr patch Commonly known as:  NICODERM CQ - dosed in mg/24 hours Place 21 mg onto the skin daily as needed (smoking cessation).   nitroGLYCERIN 0.4 MG/SPRAY spray Commonly known as:  NITROLINGUAL PLACE 1 SPRAY UNDER THE  TONGUE EVERY 5 (FIVE) MINUTES X 3 DOSES AS NEEDED FOR CHEST PAIN.   ONE TOUCH ULTRA TEST test strip Generic drug:  glucose blood USE AS DIRECTED FOR TESTING BLOOD GLUCOSE 3 TIMES DAILY   oxyCODONE 5 MG immediate release tablet Commonly known as:  Oxy IR/ROXICODONE Take 1-2 tablets (5-10 mg total) by mouth every 4 (four) hours as needed for moderate pain.   polyethylene glycol powder powder Commonly known as:  GLYCOLAX/MIRALAX Take 1 dose 17 grams in 8 oz of water daily as needed for constipation.   potassium chloride 10 MEQ tablet Commonly known as:  KLOR-CON M10 Take 1 tablet (10 mEq total) by mouth 2 (two) times daily.   ranolazine 1000 MG SR tablet Commonly known as:  RANEXA Take 1 tablet (1,000 mg total) by mouth 2 (two) times daily.   rosuvastatin 10 MG tablet Commonly known as:  CRESTOR TAKE 1 TABLET BY MOUTH DAILY   sodium chloride 0.65 % Soln nasal spray Commonly known as:  OCEAN Place 1 spray into both nostrils as needed for congestion.  Allergies:  Allergies  Allergen Reactions  . Bee Venom Itching and Swelling  . Entresto [Sacubitril-Valsartan] Cough  . Tetracycline     Unknown   . Ace Inhibitors Cough     Outstanding Labs/Studies   Consider CBC in followup  Duration of Discharge Encounter   Greater than 30 minutes including physician time.  Signed, Charlie Pitter PA-C 10/11/2017, 11:36 AM

## 2017-10-11 NOTE — Telephone Encounter (Signed)
Sched appt 11/04/17 at 11:45. Spoke to pt.

## 2017-10-14 ENCOUNTER — Telehealth: Payer: Self-pay | Admitting: Surgery

## 2017-10-14 ENCOUNTER — Telehealth: Payer: Self-pay | Admitting: Physician Assistant

## 2017-10-14 NOTE — Telephone Encounter (Signed)
I had sent staff msg to vasc PA Collins about cancelling 2/4 appt. I recently discharged this patient who stated Dr. Trula Slade told her he did not need to see her back in follow-up since Dr. Gwenlyn Found would be following her. I had clarified with Dr. Gwenlyn Found that this was in fact the plan. Did not hear back from her yet, so I called [facility] and was transferred to scheduling, then asked to leave a voicemail. I did so, requesting 2/4 appt be cancelled. Will also cc to one of the vascular nurses who entered recent appt date in chart - appreciate your help cancelling this appt for the pt. Thank you! Valera Vallas PA-C

## 2017-10-14 NOTE — Telephone Encounter (Signed)
When sching 11/04/17 appt w/ [facility], pt expressed concern, she believed that Dr. Gwenlyn Found would be following her. I told her that I did receive a staff message to sch her with Dr. Trula Slade but I would look into it. I received this message that the Dr. Trula Slade appt is unnecessary. Cxl'd 11/04/17 appt. Lm on hm# to inform pt.

## 2017-10-18 ENCOUNTER — Telehealth: Payer: Self-pay | Admitting: Surgery

## 2017-10-18 ENCOUNTER — Ambulatory Visit (HOSPITAL_COMMUNITY)
Admission: RE | Admit: 2017-10-18 | Discharge: 2017-10-18 | Disposition: A | Discharge status: Home / Self Care | Payer: Medicare Other | Source: Ambulatory Visit | Attending: Cardiology | Admitting: Cardiology

## 2017-10-18 DIAGNOSIS — I6521 Occlusion and stenosis of right carotid artery: Secondary | ICD-10-CM

## 2017-10-18 DIAGNOSIS — I6523 Occlusion and stenosis of bilateral carotid arteries: Secondary | ICD-10-CM | POA: Diagnosis not present

## 2017-10-18 NOTE — Telephone Encounter (Signed)
-----   Message from Willy Eddy, RN sent at 10/18/2017 11:58 AM EST ----- This may have already been done. If so disregard. Thanks B ----- Message ----- From: Willy Eddy, RN Sent: 10/11/2017   8:40 AM To: [facility] Admin Pool    ----- Message ----- From: Ulyses Amor, PA-C Sent: 10/11/2017   7:58 AM To: [facility] Charge Pool  F/U with Dr. Trula Slade in 2-3 weeks s/p carotid stent

## 2017-10-18 NOTE — Telephone Encounter (Signed)
Sched appt 11/18/17 at 1:45. Lm on hm# to inform pt of appt.

## 2017-10-22 ENCOUNTER — Other Ambulatory Visit: Payer: Self-pay | Admitting: Cardiovascular Disease

## 2017-10-22 DIAGNOSIS — I6521 Occlusion and stenosis of right carotid artery: Secondary | ICD-10-CM

## 2017-10-28 ENCOUNTER — Encounter: Payer: Self-pay | Admitting: Neurology

## 2017-10-28 ENCOUNTER — Ambulatory Visit (INDEPENDENT_AMBULATORY_CARE_PROVIDER_SITE_OTHER): Payer: Medicare Other | Admitting: Neurology

## 2017-10-28 VITALS — BP 118/60 | HR 81 | Ht <= 58 in | Wt 145.6 lb

## 2017-10-28 DIAGNOSIS — I6521 Occlusion and stenosis of right carotid artery: Secondary | ICD-10-CM

## 2017-10-28 DIAGNOSIS — G40009 Localization-related (focal) (partial) idiopathic epilepsy and epileptic syndromes with seizures of localized onset, not intractable, without status epilepticus: Secondary | ICD-10-CM | POA: Diagnosis not present

## 2017-10-28 DIAGNOSIS — M5010 Cervical disc disorder with radiculopathy, unspecified cervical region: Secondary | ICD-10-CM

## 2017-10-28 DIAGNOSIS — F17200 Nicotine dependence, unspecified, uncomplicated: Secondary | ICD-10-CM

## 2017-10-28 NOTE — Patient Instructions (Signed)
1.  Continue levetiracetam (Keppra) 1000mg  twice daily and gabapentin 600mg  at bedtime. 2.  Try to work on quitting smoking 3.  Follow up in 6 months.

## 2017-10-28 NOTE — Progress Notes (Signed)
NEUROLOGY FOLLOW UP OFFICE NOTE  Diane [name] [id number]  HISTORY OF PRESENT ILLNESS: Diane [name] is a 62 year old right-handed woman with CAD s/p CABG, hypertension, hyperlipidemia, type II diabetes mellitus, tobacco use, stroke, depression, fibromyalgia, migraine, OSA and COPD who follows up for neck pain and seizure disorder.     UPDATE: In March, Dilantin ER was decreased to 163m at bedtime (from twice daily) as level was undetectable anyway.  She is also now taking gabapentin 6064mat bedtime.  She is doing well.   Seizure disorder: Medications:  Keppra 100077mwice daily, gabapentin 600m68m bedtime (for neck pain) Last spell:  03/18/13.     Neck pain: She is taking gabapentin 600mg43mbedtime.  Pain has been controlled.  However, she underwent right internal carotid artery stent last week and has residual neck pain. It has aggravated the left sided neck pain as well.  She also reports episodes of tremor involving the head, hands and legs.  It is not positional.  It lasts 20 to 30 minutes.  She has had it for many years.  Blood pressure and blood sugar readings are normal.  She says her sister has Parkinson's disease.   04/11/17 LABS:  CBC with WBC 5.4, HGB 14, HCT 41.4 and PLT 281; CMP with Na 130, K 3.8, Cl 91, CO2 33, glucose 241, BUN 13, Cr 0.78, total bili 0.3, ALP 66, AST 9 and ALT 11; TSH 1.23; B12 327.   HISTORY: Seizure disorder: She was admitted to [facility] 03/03/13 to 03/06/13 for recurrent seizure-like episodes.  These spells are were witnessed by Dr. CharlWallie Charro-hospitalist, and described as "head turning to the right as well as eye deviation to the right, stiffening of right extremities with flexion of right upper extremity, and convulsive movements of right extremities. Spell lasted about 40-50 seconds and resolved without intervention. Within 30 seconds patient was again awake and conversant. She was slightly confused with respect to time."   She is unable to speak during these events.  Sometimes she was aware during these events and other times she was unaware and amnestic.  She did have urinary incontinence but no tongue biting.  It is reported that she can write down what she wants to say during these spells.  Given her stroke history, she was evaluated for possible left hemispheric stroke.  By his assessment, it seemed that her speech difficulties were non-organic, seizure-like activity non-organic, and he did not suspect acute stroke.     She had continuous EEG monitoring, which captured multiple spells without electrographic correlate.  However, she did have one captured clinical event, described as "build up to rhythmical theta activity and resultant slowing companied by clinical changes.  This may very well represent epileptiform activity.  No interictal abnormalities were noted".  Psychiatry was consulted, regarding possible pseudoseizures.  Despite the possible diagnosis of pseudoseizures, the neuro-hospitalists never made a sole diagnosis of pseudoseizures in the hospital notes.     I personally reviewed the EEG.  At 1:57:48, patient pauses, head slightly turned left, staring, with little movement and no convulsions.  She does pull at the covers a little, but no appreciable stereotypical hand movements.  When it is over, she begins moving around again.  Electrographically, she does have a build up of generalized theta activity up until 1:58:26, followed by generalized delta slowing lasting until 1:58:45.  Difficult to lateralize, but may be more prominent in the left hemisphere.  However, this appears  to only be a clip of the event and the rest of the study is not available to me.  Therefore I cannot comment on the other captured spells.   MRI of brain performed on 03/03/13, which reported possible tiny acute infarct in the posterior left frontal lobe, but neurology believed this to be artifact.  There was also mention of incidental tiny  cysts in right hippocampal region.  Encephalomalacia in the right frontoparietal region, from prior remote stroke, is seen.  MRI of brain without contrast from 12/08/15 showed chronic right MCA infarct but also showed chronic looking lacunar infarcts in the bilateral basal ganglia and left thalamus, which are new compared to prior imaging from June 2014.  Prior AEDs:  Dilantin ER 158m   Cervical radiculopathy: She has history of right sided neck pain radiating down to the right shoulder, associated with tingling from the right ear down to the shoulder.  Hot and cold compresses help too.  No numbness, pain or weakness down the arm.  Pain worse when turning neck to the left.  She has trigger finger and numbness in the fingers when driving or picking up objects, thought to be carpal tunnel syndrome.  She has history of cervical fusion in the 1980s.  PT was ineffective.  .[name] MRI of the cervical spine performed on 05/23/14 showed moderately large foraminal disc protrusion and associated osteophyte at C3-4, causing right foraminal encroachment.  Solid fusion C5 through C7 noted.  She was referred for right C4 nerve root injection, however it was not done.   PAST MEDICAL HISTORY: Past Medical History:  Diagnosis Date  . Anemia   . Carotid stenosis    a. s/p right carotid stent 10/2017.  .[place] ([place]   . Chronic systolic CHF (congestive heart failure) ([place]   . Colon polyps   . COPD [place] obstructive pulmonary disease) ([place]   . Coronary artery disease    post CABG in 3/07 , coronary stents   . CVA (cerebral infarction) 1993  . Diabetes mellitus    type 2  . Dyslipidemia   . Fibromyalgia   . GERD (gastroesophageal reflux disease)   . Headache    hx of   . Hyperlipidemia   . Hypertension   . Myocardial infarction ([facility]   . Pneumonia    hx of   . PONV (postoperative nausea and vomiting)    only once was patient sick in the 1980s   . Seizure ([place]   . [place]izures ([place]    last  seizure- 03/2013   . Shortness of breath dyspnea    with exertion or when fluid builds up   . Sleep apnea    used to wear a cpap- not used in 3 years   . Status post dilation of esophageal narrowing   . Stroke ([facility] 1993   problems with balance   . Systolic murmur    known mild AS and MR  . Thyroid goiter   . Tobacco abuse     MEDICATIONS: Current Outpatient Medications on File Prior to Visit  Medication Sig Dispense Refill  . acetaminophen (TYLENOL) 500 MG tablet Take 1,000 mg by mouth every 6 (six) hours as needed for moderate pain or headache.    . albuterol (VENTOLIN HFA) 108 (90 Base) MCG/ACT inhaler INHALE 2 PUFFS EVERY 6 HOURS AS NEEDED FOR WHEEZING/SHORTNESS OF BREATH 18 Inhaler 3  . aspirin EC 81 MG tablet Take 81 mg by mouth every evening.  30 tablet 6  .  BEVESPI AEROSPHERE 9-4.8 MCG/ACT AERO TAKE 2 PUFFS BY MOUTH TWICE A DAY 10.7 Inhaler 5  . carvedilol (COREG) 25 MG tablet Take 1 tablet (25 mg total) by mouth 2 (two) times daily with a meal. 180 tablet 3  . cetirizine (ZYRTEC) 10 MG tablet Take 10 mg by mouth daily as needed for allergies.    [name] clopidogrel (PLAVIX) 75 MG tablet TAKE 1 TABLET BY MOUTH EVERY DAY 90 tablet 3  . cyclobenzaprine (FLEXERIL) 10 MG tablet Take 1 tablet (10 mg total) by mouth at bedtime. (Patient taking differently: Take 10 mg by mouth as needed for muscle spasms. ) 90 tablet 1  . dexlansoprazole (DEXILANT) 60 MG capsule TAKE 1 CAPSULE (60 MG TOTAL) BY MOUTH DAILY. (Patient taking differently: TAKE 1 CAPSULE (60 MG TOTAL) BY MOUTH DAILY IN THE EVENING) 30 capsule 11  . diphenhydrAMINE (BENADRYL) 25 mg capsule Take 2 capsules in AM and 1 capsule at night (Patient taking differently: Take 50 mg by mouth 2 (two) times daily as needed for itching or allergies. ) 270 capsule 3  . fluticasone (FLONASE) 50 MCG/ACT nasal spray PLACE 2 SPRAYS INTO BOTH NOSTRILS DAILY. (Patient taking differently: Place 2 sprays into both nostrils daily as needed for allergies. )  16 g 5  . furosemide (LASIX) 40 MG tablet Take 1 tablet (40 mg total) by mouth daily. 90 tablet 3  . gabapentin (NEURONTIN) 300 MG capsule Take 1 capsule (300 mg total) by mouth 3 (three) times daily. (Patient taking differently: Take 600 mg by mouth every evening. ) 270 capsule 3  . glimepiride (AMARYL) 4 MG tablet Take 4 mg by mouth 2 (two) times daily.     [name] guaiFENesin (MUCINEX) 600 MG 12 hr tablet Take 2 tablets (1,200 mg total) by mouth 2 (two) times daily. 360 tablet 3  . hydrochlorothiazide (HYDRODIURIL) 50 MG tablet Take 25 mg by mouth daily.    . isosorbide mononitrate (IMDUR) 30 MG 24 hr tablet Take 1 tablet (30 mg total) by mouth daily. 90 tablet 3  . levETIRAcetam (KEPPRA) 1000 MG tablet TAKE 1 TABLET BY MOUTH TWICE A DAY 60 tablet 5  . losartan (COZAAR) 100 MG tablet Take 1 tablet (100 mg total) by mouth daily. 90 tablet 3  . metFORMIN (GLUCOPHAGE-XR) 500 MG 24 hr tablet Take 500 mg by mouth 2 (two) times daily.  6  . nicotine (NICODERM CQ - DOSED IN MG/24 HOURS) 21 mg/24hr patch Place 21 mg onto the skin daily as needed (smoking cessation).     . nitroGLYCERIN (NITROLINGUAL) 0.4 MG/SPRAY spray PLACE 1 SPRAY UNDER THE TONGUE EVERY 5 (FIVE) MINUTES X 3 DOSES AS NEEDED FOR CHEST PAIN. 12 g 3  . ONE TOUCH ULTRA TEST test strip USE AS DIRECTED FOR TESTING BLOOD GLUCOSE 3 TIMES DAILY  1  . oxyCODONE (OXY IR/ROXICODONE) 5 MG immediate release tablet Take 1-2 tablets (5-10 mg total) by mouth every 4 (four) hours as needed for moderate pain. 6 tablet 0  . polyethylene glycol powder (GLYCOLAX/MIRALAX) powder Take 1 dose 17 grams in 8 oz of water daily as needed for constipation. 578 g 3  . potassium chloride (KLOR-CON M10) 10 MEQ tablet Take 1 tablet (10 mEq total) by mouth 2 (two) times daily. 180 tablet 3  . ranolazine (RANEXA) 1000 MG SR tablet Take 1 tablet (1,000 mg total) by mouth 2 (two) times daily. 180 tablet 3  . rosuvastatin (CRESTOR) 10 MG tablet TAKE 1 TABLET BY MOUTH DAILY 90  tablet  3  . sodium chloride (OCEAN) 0.65 % SOLN nasal spray Place 1 spray into both nostrils as needed for congestion.     No current facility-administered medications on file prior to visit.     ALLERGIES: Allergies  Allergen Reactions  . Bee Venom Itching and Swelling  . Entresto [Sacubitril-Valsartan] Cough  . Tetracycline     Unknown   . Ace Inhibitors Cough    FAMILY HISTORY: Family History  Problem Relation Age of Onset  . Heart disease Mother   . Diabetes Mother   . COPD Mother   . Hyperlipidemia Mother   . Hypertension Mother   . Cancer Father        met, origin unknown  . Heart attack Father   . Drug abuse Paternal Grandmother   . Stroke Paternal Grandfather   . Lung cancer Paternal Aunt        lung with mets to brain  . Melanoma Paternal Uncle   . Lung cancer Paternal Uncle        lung/liver to brain  . Cancer Paternal Uncle        cancer of unknown type  . Heart disease Maternal Grandfather   . Hypertension Sister   . Cancer Sister        eyelid  . Glaucoma Sister   . Heart disease Maternal Aunt        x 2 aunts    SOCIAL HISTORY: Social History   Socioeconomic History  . Marital status: Divorced    Spouse name: Not on file  . Number of children: 0  . Years of education: Not on file  . Highest education level: Not on file  Social Needs  . Financial resource strain: Not on file  . Food insecurity - worry: Not on file  . Food insecurity - inability: Not on file  . Transportation needs - medical: Not on file  . Transportation needs - non-medical: Not on file  Occupational History  . Occupation: disabled  Tobacco Use  . Smoking status: Current Every Day Smoker    Packs/day: 1.50    Years: 50.00    Pack years: 75.00    Types: Cigarettes  . Smokeless tobacco: Never Used  . Tobacco comment: 1.5 pack to 2 packs per day  Substance and Sexual Activity  . Alcohol use: No    Alcohol/week: 0.6 oz    Types: 1 Standard drinks or equivalent per week   . Drug use: No  . Sexual activity: Not Currently    Partners: Male    Birth control/protection: None  Other Topics Concern  . Not on file  Social History Narrative  . Not on file    REVIEW OF SYSTEMS: Constitutional: No fevers, chills, or sweats, no generalized fatigue, change in appetite Eyes: No visual changes, double vision, eye pain Ear, nose and throat: No hearing loss, ear pain, nasal congestion, sore throat Cardiovascular: No chest pain, palpitations Respiratory:  No shortness of breath at rest or with exertion, wheezes GastrointestinaI: No nausea, vomiting, diarrhea, abdominal pain, fecal incontinence Genitourinary:  No dysuria, urinary retention or frequency Musculoskeletal:  No neck pain, back pain Integumentary: No rash, pruritus, skin lesions Neurological: as above Psychiatric: No depression, insomnia, anxiety Endocrine: No palpitations, fatigue, diaphoresis, mood swings, change in appetite, change in weight, increased thirst Hematologic/Lymphatic:  No purpura, petechiae. Allergic/Immunologic: no itchy/runny eyes, nasal congestion, recent allergic reactions, rashes  PHYSICAL EXAM: Vitals:   10/28/17 1323  BP: 118/60  Pulse: 81  SpO2: 94%  General: No acute distress.  Patient appears well-groomed.   Head:  Normocephalic/atraumatic Eyes:  Fundi examined but not visualized Neck: supple, no paraspinal tenderness, full range of motion Heart:  Regular rate and rhythm Lungs:  Clear to auscultation bilaterally Back: No paraspinal tenderness Neurological Exam: alert and oriented to person, place, and time. Attention span and concentration intact, recent and remote memory intact, fund of knowledge intact.  Speech fluent and not dysarthric, language intact.  CN II-XII intact. Bulk and tone normal, muscle strength 5/5 throughout.  Sensation to light touch  intact.  Deep tendon reflexes 2+ throughout.  Finger to nose testing with mild intention tremor bilaterally.   Wide-based cautious gait.  IMPRESSION: Seizure disorder Cervical radiculopathy Tobacco use Tremor.  Unclear etiology . Except for mild intention tremor, no significant tremor appreciated on exam.  No signs of parkinsonism appreciated.  PLAN: 1.  Continue Keppra 1069m twice daily and gabapentin 6063mat bedtime 2.  Smoking cessation instruction/counseling given:  counseled patient on the dangers of tobacco use, advised patient to stop smoking, and reviewed strategies to maximize success. 3.  Follow up in 6 months.  25 minutes spent face to face with patient, over 50% spent discussing management.  AdMetta ClinesDO  CC:  MaLoura PardonMD

## 2017-10-29 ENCOUNTER — Encounter: Payer: Self-pay | Admitting: Cardiovascular Disease

## 2017-10-29 ENCOUNTER — Ambulatory Visit (INDEPENDENT_AMBULATORY_CARE_PROVIDER_SITE_OTHER): Payer: Medicare Other | Admitting: Cardiovascular Disease

## 2017-10-29 VITALS — BP 138/68 | HR 79 | Ht <= 58 in | Wt 145.0 lb

## 2017-10-29 DIAGNOSIS — I10 Essential (primary) hypertension: Secondary | ICD-10-CM | POA: Diagnosis not present

## 2017-10-29 DIAGNOSIS — I6523 Occlusion and stenosis of bilateral carotid arteries: Secondary | ICD-10-CM | POA: Diagnosis not present

## 2017-10-29 NOTE — Assessment & Plan Note (Signed)
Ms. Hogan is status post right internal carotid artery stenting by myself and Dr. Trula Slade on 10/10/17 for high-grade right ICA stenosis surgically inoperable because of comorbidities.her procedure was uncomplicated. She was discharged home the following day. Her follow-up Dopplers performed 10/18/17 showed a widely patent right internal carotid. She remains on dual antiplatelet therapy.

## 2017-10-29 NOTE — Patient Instructions (Signed)
Medication Instructions: Your physician recommends that you continue on your current medications as directed. Please refer to the Current Medication list given to you today.  Testing/Procedures: Your physician has requested that you have a carotid duplex---due in July 2019. This test is an ultrasound of the carotid arteries in your neck. It looks at blood flow through these arteries that supply the brain with blood. Allow one hour for this exam. There are no restrictions or special instructions.  Follow-Up: We request that you follow-up in: 3 months with an extender and in 6 months with Dr Andria Rhein will receive a reminder letter in the mail two months in advance. If you don't receive a letter, please call our office to schedule the follow-up appointment.  If you need a refill on your cardiac medications before your next appointment, please call your pharmacy.

## 2017-10-29 NOTE — Progress Notes (Signed)
10/29/2017 [place]   [birth year]  [id number]  Primary Physician Tower, [name], MD Primary Cardiologist: Lorretta Harp MD Garret Reddish, [place], [place]  HPI:  Diane [name] is a 62 y.o.  Caucasian female with a history of CAD status post coronary artery bypass grafting March 2007 by Dr. [name].I last saw her in the office 10/10/17.She's had subsequent stenting and known occluded grafts except for her her LIMA. She has severe LV dysfunction with moderate pulmonary hypertension. I last saw her in the office 06/20/16. Her mother, [name], was a long-term patient of mine as well. Her other Problems include diabetes, hypokalemia, hypertension and ongoing tobacco abuse. She's had progression of her bilateral carotid artery stenosis left greater than right . She is neurologically asymptomatic. She is high risk for carotid endarterectomy. I performed cerebral angiography and her 11/15/15 revealing 70% bilateral ICA stenosis with pins beyond the stenosis and significant calcification on the left making stenting not appropriate. Her carotid Dopplers performed on 06/26/16 showed stable bilateral moderate ICA stenosis and lower extremity Dopplers performed 12/12/16 revealed ABIs 0.93 bilaterally with moderate but not severe disease. She does complain of some leg pain which has some atypical characteristics for claudication Since I saw her in the office she's had carotid Dopplers studies performed 06/06/17 revealing significant progression of her right ICA stenosis. She is not a candidate for general anesthesia or carotid endarterectomy because of her severe LV dysfunction, ischemic heart disease and COPD. She underwent elective right internal carotid artery stenting by myself and Dr. Trula Slade 10/10/17 using distal protection. Her left ICA is moderately stenosed however not percutaneously addressable because of angulation. Her final result was excellent. Her consultation was uncomplicated. Follow-up  Doppler study performed 10/18/17 revealed a widely patent right internal carotid artery. She does continue to smoke however    Current Meds  Medication Sig  . acetaminophen (TYLENOL) 500 MG tablet Take 1,000 mg by mouth every 6 (six) hours as needed for moderate pain or headache.  . albuterol (VENTOLIN HFA) 108 (90 Base) MCG/ACT inhaler INHALE 2 PUFFS EVERY 6 HOURS AS NEEDED FOR WHEEZING/SHORTNESS OF BREATH  . aspirin EC 81 MG tablet Take 81 mg by mouth every evening.   [name] BEVESPI AEROSPHERE 9-4.8 MCG/ACT AERO TAKE 2 PUFFS BY MOUTH TWICE A DAY  . carvedilol (COREG) 25 MG tablet Take 1 tablet (25 mg total) by mouth 2 (two) times daily with a meal.  . cetirizine (ZYRTEC) 10 MG tablet Take 10 mg by mouth daily as needed for allergies.  [name] clopidogrel (PLAVIX) 75 MG tablet TAKE 1 TABLET BY MOUTH EVERY DAY  . cyclobenzaprine (FLEXERIL) 10 MG tablet Take 1 tablet (10 mg total) by mouth at bedtime. (Patient taking differently: Take 10 mg by mouth as needed for muscle spasms. )  . dexlansoprazole (DEXILANT) 60 MG capsule TAKE 1 CAPSULE (60 MG TOTAL) BY MOUTH DAILY. (Patient taking differently: TAKE 1 CAPSULE (60 MG TOTAL) BY MOUTH DAILY IN THE EVENING)  . diphenhydrAMINE (BENADRYL) 25 mg capsule Take 2 capsules in AM and 1 capsule at night (Patient taking differently: Take 50 mg by mouth 2 (two) times daily as needed for itching or allergies. )  . fluticasone (FLONASE) 50 MCG/ACT nasal spray PLACE 2 SPRAYS INTO BOTH NOSTRILS DAILY. (Patient taking differently: Place 2 sprays into both nostrils daily as needed for allergies. )  . furosemide (LASIX) 40 MG tablet Take 1 tablet (40 mg total) by mouth daily.  [name] gabapentin (NEURONTIN) 300 MG  capsule Take 1 capsule (300 mg total) by mouth 3 (three) times daily. (Patient taking differently: Take 600 mg by mouth every evening. )  . glimepiride (AMARYL) 4 MG tablet Take 4 mg by mouth 2 (two) times daily.   [name] guaiFENesin (MUCINEX) 600 MG 12 hr tablet Take 2 tablets  (1,200 mg total) by mouth 2 (two) times daily.  . hydrochlorothiazide (HYDRODIURIL) 50 MG tablet Take 25 mg by mouth daily.  . isosorbide mononitrate (IMDUR) 30 MG 24 hr tablet Take 1 tablet (30 mg total) by mouth daily.  [name] levETIRAcetam (KEPPRA) 1000 MG tablet TAKE 1 TABLET BY MOUTH TWICE A DAY  . losartan (COZAAR) 100 MG tablet Take 1 tablet (100 mg total) by mouth daily.  . metFORMIN (GLUCOPHAGE-XR) 500 MG 24 hr tablet Take 500 mg by mouth 2 (two) times daily.  . nicotine (NICODERM CQ - DOSED IN MG/24 HOURS) 21 mg/24hr patch Place 21 mg onto the skin daily as needed (smoking cessation).   . nitroGLYCERIN (NITROLINGUAL) 0.4 MG/SPRAY spray PLACE 1 SPRAY UNDER THE TONGUE EVERY 5 (FIVE) MINUTES X 3 DOSES AS NEEDED FOR CHEST PAIN.  . ONE TOUCH ULTRA TEST test strip USE AS DIRECTED FOR TESTING BLOOD GLUCOSE 3 TIMES DAILY  . oxyCODONE (OXY IR/ROXICODONE) 5 MG immediate release tablet Take 1-2 tablets (5-10 mg total) by mouth every 4 (four) hours as needed for moderate pain.  . polyethylene glycol powder (GLYCOLAX/MIRALAX) powder Take 1 dose 17 grams in 8 oz of water daily as needed for constipation.  . potassium chloride (KLOR-CON M10) 10 MEQ tablet Take 1 tablet (10 mEq total) by mouth 2 (two) times daily.  . ranolazine (RANEXA) 1000 MG SR tablet Take 1 tablet (1,000 mg total) by mouth 2 (two) times daily.  . rosuvastatin (CRESTOR) 10 MG tablet TAKE 1 TABLET BY MOUTH DAILY  . sodium chloride (OCEAN) 0.65 % SOLN nasal spray Place 1 spray into both nostrils as needed for congestion.     Allergies  Allergen Reactions  . Bee Venom Itching and Swelling  . Entresto [Sacubitril-Valsartan] Cough  . Tetracycline     Unknown   . Ace Inhibitors Cough    Social History   Socioeconomic History  . Marital status: Divorced    Spouse name: Not on file  . Number of children: 0  . Years of education: Not on file  . Highest education level: Not on file  Social Needs  . Financial resource strain: Not on  file  . Food insecurity - worry: Not on file  . Food insecurity - inability: Not on file  . Transportation needs - medical: Not on file  . Transportation needs - non-medical: Not on file  Occupational History  . Occupation: disabled  Tobacco Use  . Smoking status: Current Every Day Smoker    Packs/day: 1.50    Years: 50.00    Pack years: 75.00    Types: Cigarettes  . Smokeless tobacco: Never Used  . Tobacco comment: 1.5 pack to 2 packs per day  Substance and Sexual Activity  . Alcohol use: No    Alcohol/week: 0.6 oz    Types: 1 Standard drinks or equivalent per week  . Drug use: No  . Sexual activity: Not Currently    Partners: Male    Birth control/protection: None  Other Topics Concern  . Not on file  Social History Narrative  . Not on file     Review of Systems: General: negative for chills, fever, night sweats or weight  changes.  Cardiovascular: negative for chest pain, dyspnea on exertion, edema, orthopnea, palpitations, paroxysmal nocturnal dyspnea or shortness of breath Dermatological: negative for rash Respiratory: negative for cough or wheezing Urologic: negative for hematuria Abdominal: negative for nausea, vomiting, diarrhea, bright red blood per rectum, melena, or hematemesis Neurologic: negative for visual changes, syncope, or dizziness All other systems reviewed and are otherwise negative except as noted above.    Blood pressure 138/68, pulse 79, height 4\' 10"  (1.473 m), weight 145 lb (65.8 kg), last menstrual period 09/02/2003.  General appearance: alert and no distress Neck: no adenopathy, no carotid bruit, no JVD, supple, symmetrical, trachea midline and thyroid not enlarged, symmetric, no tenderness/mass/nodules Lungs: clear to auscultation bilaterally Heart: regular rate and rhythm, S1, S2 normal, no murmur, click, rub or gallop Extremities: extremities normal, atraumatic, no cyanosis or edema Pulses: 2+ and symmetric Skin: Skin color, texture,  turgor normal. No rashes or lesions Neurologic: Alert and oriented X 3, normal strength and tone. Normal symmetric reflexes. Normal coordination and gait  EKG sinus rhythm at 79 with inferior Q waves, poor R-wave progression, nonspecific IVCD and lateral T-wave inversion. I personally reviewed this EKG.  ASSESSMENT AND PLAN:   Carotid stenosis Ms. Therrien is status post right internal carotid artery stenting by myself and Dr. Trula Slade on 10/10/17 for high-grade right ICA stenosis surgically inoperable because of comorbidities.her procedure was uncomplicated. She was discharged home the following day. Her follow-up Dopplers performed 10/18/17 showed a widely patent right internal carotid. She remains on dual antiplatelet therapy.      Lorretta Harp MD FACP,FACC,FAHA, [facility] 10/29/2017 9:22 AM

## 2017-11-04 ENCOUNTER — Encounter: Payer: Self-pay | Admitting: Surgery

## 2017-11-06 ENCOUNTER — Other Ambulatory Visit: Payer: Self-pay | Admitting: Cardiology

## 2017-11-15 ENCOUNTER — Other Ambulatory Visit: Payer: Self-pay | Admitting: Gastroenterology

## 2017-11-18 ENCOUNTER — Encounter: Payer: Medicare Other | Admitting: Surgery

## 2017-11-25 ENCOUNTER — Other Ambulatory Visit: Payer: Self-pay | Admitting: Pulmonary Disease

## 2017-11-29 ENCOUNTER — Other Ambulatory Visit: Payer: Self-pay

## 2017-11-29 ENCOUNTER — Telehealth: Payer: Self-pay

## 2017-11-29 NOTE — Telephone Encounter (Signed)
Prescription filled on 11/15/17

## 2017-12-10 ENCOUNTER — Ambulatory Visit (HOSPITAL_COMMUNITY)
Admission: RE | Admit: 2017-12-10 | Payer: Medicare Other | Source: Ambulatory Visit | Attending: Cardiovascular Disease | Admitting: Cardiovascular Disease

## 2017-12-17 ENCOUNTER — Other Ambulatory Visit: Payer: Self-pay | Admitting: Neurology

## 2017-12-17 ENCOUNTER — Other Ambulatory Visit: Payer: Self-pay | Admitting: Cardiology

## 2017-12-23 ENCOUNTER — Other Ambulatory Visit: Payer: Self-pay | Admitting: Pulmonary Disease

## 2017-12-25 ENCOUNTER — Other Ambulatory Visit: Payer: Self-pay | Admitting: Neurology

## 2018-01-01 DIAGNOSIS — E119 Type 2 diabetes mellitus without complications: Secondary | ICD-10-CM | POA: Diagnosis not present

## 2018-01-01 DIAGNOSIS — I10 Essential (primary) hypertension: Secondary | ICD-10-CM | POA: Diagnosis not present

## 2018-01-01 DIAGNOSIS — E1121 Type 2 diabetes mellitus with diabetic nephropathy: Secondary | ICD-10-CM | POA: Diagnosis not present

## 2018-01-01 DIAGNOSIS — E041 Nontoxic single thyroid nodule: Secondary | ICD-10-CM | POA: Diagnosis not present

## 2018-01-01 DIAGNOSIS — E7800 Pure hypercholesterolemia, unspecified: Secondary | ICD-10-CM | POA: Diagnosis not present

## 2018-01-02 ENCOUNTER — Other Ambulatory Visit: Payer: Self-pay | Admitting: Endocrinology

## 2018-01-02 DIAGNOSIS — E119 Type 2 diabetes mellitus without complications: Secondary | ICD-10-CM

## 2018-01-13 ENCOUNTER — Other Ambulatory Visit: Payer: Self-pay

## 2018-01-13 ENCOUNTER — Emergency Department (HOSPITAL_COMMUNITY): Payer: Medicare Other

## 2018-01-13 ENCOUNTER — Emergency Department (HOSPITAL_COMMUNITY)
Admission: EM | Admit: 2018-01-13 | Discharge: 2018-01-14 | Disposition: A | Discharge status: Home / Self Care | Payer: Medicare Other | Attending: Emergency Medicine | Admitting: Emergency Medicine

## 2018-01-13 ENCOUNTER — Encounter (HOSPITAL_COMMUNITY): Payer: Self-pay | Admitting: Emergency Medicine

## 2018-01-13 DIAGNOSIS — Z7984 Long term (current) use of oral hypoglycemic drugs: Secondary | ICD-10-CM | POA: Insufficient documentation

## 2018-01-13 DIAGNOSIS — Y92511 Restaurant or cafe as the place of occurrence of the external cause: Secondary | ICD-10-CM | POA: Diagnosis not present

## 2018-01-13 DIAGNOSIS — W19XXXA Unspecified fall, initial encounter: Secondary | ICD-10-CM

## 2018-01-13 DIAGNOSIS — E1142 Type 2 diabetes mellitus with diabetic polyneuropathy: Secondary | ICD-10-CM | POA: Diagnosis not present

## 2018-01-13 DIAGNOSIS — M25561 Pain in right knee: Secondary | ICD-10-CM | POA: Diagnosis not present

## 2018-01-13 DIAGNOSIS — S4991XA Unspecified injury of right shoulder and upper arm, initial encounter: Secondary | ICD-10-CM | POA: Diagnosis not present

## 2018-01-13 DIAGNOSIS — I2510 Atherosclerotic heart disease of native coronary artery without angina pectoris: Secondary | ICD-10-CM | POA: Insufficient documentation

## 2018-01-13 DIAGNOSIS — M25532 Pain in left wrist: Secondary | ICD-10-CM | POA: Diagnosis not present

## 2018-01-13 DIAGNOSIS — I509 Heart failure, unspecified: Secondary | ICD-10-CM | POA: Insufficient documentation

## 2018-01-13 DIAGNOSIS — S8001XA Contusion of right knee, initial encounter: Secondary | ICD-10-CM | POA: Diagnosis not present

## 2018-01-13 DIAGNOSIS — Z7902 Long term (current) use of antithrombotics/antiplatelets: Secondary | ICD-10-CM | POA: Diagnosis not present

## 2018-01-13 DIAGNOSIS — Y9301 Activity, walking, marching and hiking: Secondary | ICD-10-CM | POA: Diagnosis not present

## 2018-01-13 DIAGNOSIS — W01198A Fall on same level from slipping, tripping and stumbling with subsequent striking against other object, initial encounter: Secondary | ICD-10-CM | POA: Insufficient documentation

## 2018-01-13 DIAGNOSIS — Z951 Presence of aortocoronary bypass graft: Secondary | ICD-10-CM | POA: Insufficient documentation

## 2018-01-13 DIAGNOSIS — I110 Hypertensive heart disease with heart failure: Secondary | ICD-10-CM | POA: Insufficient documentation

## 2018-01-13 DIAGNOSIS — J449 Chronic obstructive pulmonary disease, unspecified: Secondary | ICD-10-CM | POA: Insufficient documentation

## 2018-01-13 DIAGNOSIS — S6992XA Unspecified injury of left wrist, hand and finger(s), initial encounter: Secondary | ICD-10-CM | POA: Insufficient documentation

## 2018-01-13 DIAGNOSIS — Y999 Unspecified external cause status: Secondary | ICD-10-CM | POA: Diagnosis not present

## 2018-01-13 DIAGNOSIS — M25551 Pain in right hip: Secondary | ICD-10-CM | POA: Diagnosis not present

## 2018-01-13 DIAGNOSIS — M25511 Pain in right shoulder: Secondary | ICD-10-CM | POA: Diagnosis not present

## 2018-01-13 DIAGNOSIS — S79911A Unspecified injury of right hip, initial encounter: Secondary | ICD-10-CM | POA: Diagnosis not present

## 2018-01-13 DIAGNOSIS — S8991XA Unspecified injury of right lower leg, initial encounter: Secondary | ICD-10-CM | POA: Diagnosis present

## 2018-01-13 NOTE — ED Triage Notes (Signed)
Pt reports mechanical fall today on wet floor, states she didn't see the sign. Landed on R knee.

## 2018-01-14 NOTE — ED Notes (Signed)
Pt aware of bed status

## 2018-01-14 NOTE — ED Provider Notes (Signed)
[facility] EMERGENCY DEPARTMENT Provider Note   CSN: [id number] Arrival date & time: 01/13/18  2143     History   Chief Complaint Chief Complaint  Patient presents with  . Fall    HPI Diane [name] is a 62 y.o. female.  HPI Pt states she fell on a wet floor at a restaurant.  She landed on her right knee and wrist.  Shee has pain in the right knee, hip and wrist. Pt has been able to walk.  No head injury.  No loc.  No vomiting.  No numbness or weakness.   Patient unfortunately had to wait overnight waiting to be seen.  Most of her symptoms have improved at this point.  Her primary her soreness is in her right knee Past Medical History:  Diagnosis Date  . Anemia   . Carotid stenosis    a. s/p right carotid stent 10/2017.  [name] Chronic bronchitis ([place])   . Chronic systolic CHF (congestive heart failure) ([place])   . Colon polyps   . COPD (chronic obstructive pulmonary disease) ([place])   . Coronary artery disease    post CABG in 3/07 , coronary stents   . CVA (cerebral infarction) 1993  . Diabetes mellitus    type 2  . Dyslipidemia   . Fibromyalgia   . GERD (gastroesophageal reflux disease)   . Headache    hx of   . Hyperlipidemia   . Hypertension   . Myocardial infarction ([place])   . Pneumonia    hx of   . PONV (postoperative nausea and vomiting)    only once was patient sick in the 1980s   . Seizure ([place])   . Seizures ([place])    last seizure- 03/2013   . Shortness of breath dyspnea    with exertion or when fluid builds up   . Sleep apnea    used to wear a cpap- not used in 3 years   . Status post dilation of esophageal narrowing   . Stroke [facility]) 1993   problems with balance   . Systolic murmur    known mild AS and MR  . Thyroid goiter   . Tobacco abuse     Patient Active Problem List   Diagnosis Date Noted  . Carotid stenosis 10/10/2017  . Other constipation 05/07/2017  . RLQ abdominal pain 05/07/2017  . Chronic anticoagulation 05/07/2017  .  Colon cancer screening 04/19/2017  . Incisional hernia 01/14/2017  . Need for hepatitis C screening test 08/13/2016  . Smokers' cough ([place]) 08/13/2016  . Chronic respiratory failure ([place]) 07/11/2016  . COPD with chronic bronchitis ([place]) 05/20/2016  . Sleep-related hypoventilation due to lower airway obstruction 05/20/2016  . Periodic limb movements of sleep 05/20/2016  . CHF (congestive heart failure) ([place]) 05/08/2016  . Type 2 diabetes mellitus with diabetic polyneuropathy, with long-term current use of insulin ([place]) 12/13/2015  . B12 deficiency 12/01/2015  . History of CVA (cerebrovascular accident) 11/29/2015  . Abnormal nuclear stress test 05/06/2015  . 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 of localized onset, not intractable, without status epilepticus ([place]) 03/22/2015  . Cervical disc disorder with radiculopathy of cervical region 03/22/2015  . Complex partial seizures ([place]) 09/16/2013  . Encounter for Medicare annual wellness exam 06/16/2013  . Antiplatelet or antithrombotic long-term use 06/08/2013  . Seizures ([place]) 03/25/2013  . Goiter 09/01/2012  . Other screening mammogram 06/13/2012  . Allergic rhinitis 06/13/2012  . Hyponatremia 12/11/2011  . Carotid artery disease (  [place]) 08/20/2011  . DIZZINESS 07/26/2010  . Obstructive sleep apnea 09/18/2007  . TOBACCO USE 08/08/2007  . DIABETES MELLITUS, TYPE II 03/26/2007  . Hyperlipidemia 03/26/2007  . ANXIETY 03/26/2007  . DEPRESSION 03/26/2007  . MIGRAINE HEADACHE 03/26/2007  . CARPAL TUNNEL SYNDROME, BILATERAL 03/26/2007  . Essential hypertension 03/26/2007  . Coronary atherosclerosis 03/26/2007  . Cerebral artery occlusion with cerebral infarction ([place]) 03/26/2007  . GERD 03/26/2007  . Claudication in peripheral vascular disease ([place]) 03/26/2007    Past Surgical History:  Procedure Laterality Date  . APPENDECTOMY    . CARDIAC CATHETERIZATION  11/29/05   EF of 55%  . CARDIAC  CATHETERIZATION  08/06/06   EF of 45-50%  . CARDIAC CATHETERIZATION N/A 05/06/2015   Procedure: Right/Left Heart Cath and Coronary/Graft Angiography;  Surgeon: Peter M Martinique, MD;  Location: [facility] CV LAB;  Service: Cardiovascular;  Laterality: N/A;  . CAROTID PTA/STENT INTERVENTION N/A 10/10/2017   Procedure: CAROTID PTA/STENT INTERVENTION - Right;  Surgeon: Serafina Mitchell, MD;  Location: [place] CV LAB;  Service: Cardiovascular;  Laterality: N/A;  . CERVICAL FUSION  1990  . CHOLECYSTECTOMY    . COLON RESECTION     mass removed and 4 in of colon  . COLONOSCOPY WITH PROPOFOL N/A 07/16/2017   Procedure: COLONOSCOPY WITH PROPOFOL;  Surgeon: Ladene Artist, MD;  Location: [facility];  Service: Endoscopy;  Laterality: N/A;  . CORONARY ARTERY BYPASS GRAFT  12/04/2005   x5 -- left internal mammary artery to the LAD, left radial artery to the ramus intermedius, saphenous vein graft to the obtuse marginal 1, sequential saphenous vein grat to the acute marginal and posterior descending, endoscopic vein harvesting from the left thigh with open vein harvest from right leg  . CORONARY STENT PLACEMENT  08/11/06   PCI of her ciurcumflex/OM vessel  . LAPAROTOMY Bilateral 05/19/2015   Procedure: EXPLORATORY LAPAROTOMY WITH BILATERAL SALPINGO OOPHORECTOMY /OMENTECTOMY/SEGMENTAL SIGMOID COLECTOMY ;  Surgeon: Everitt Amber, MD;  Location: [facility];  Service: Gynecology;  Laterality: Bilateral;  . PERIPHERAL VASCULAR CATHETERIZATION N/A 11/15/2015   Procedure: Carotid PTA/Stent Intervention;  Surgeon: Lorretta Harp, MD;  Location: [facility] CV LAB;  Service: Cardiovascular;  Laterality: N/A;  . PERIPHERAL VASCULAR CATHETERIZATION  11/15/2015   Procedure: Carotid Angiography;  Surgeon: Lorretta Harp, MD;  Location: [place] CV LAB;  Service: Cardiovascular;;     OB History    Gravida  1   Para      Term      Preterm      AB      Living        SAB      TAB      Ectopic      Multiple       Live Births               Home Medications    Prior to Admission medications   Medication Sig Start Date End Date Taking? Authorizing Provider  acetaminophen (TYLENOL) 500 MG tablet Take 1,000 mg by mouth every 6 (six) hours as needed for moderate pain or headache.    [provider]  albuterol (VENTOLIN HFA) 108 (90 Base) MCG/ACT inhaler INHALER 2 PUFFS EVERY 6 HOURS AS NEEDED FOR WHEEZING/SHORTNESS OF BREATH 12/23/17   [name], MD  aspirin EC 81 MG tablet Take 81 mg by mouth every evening.  05/16/15   Martinique, Peter M, MD  BEVESPI AEROSPHERE 9-4.8 MCG/ACT AERO TAKE 2 PUFFS BY MOUTH TWICE A  DAY 09/13/17   [name], MD  carvedilol (COREG) 25 MG tablet TAKE 1 TABLET (25 MG TOTAL) BY MOUTH 2 (TWO) TIMES DAILY WITH A MEAL. 11/06/17   Martinique, Peter M, MD  cetirizine (ZYRTEC) 10 MG tablet Take 10 mg by mouth daily as needed for allergies.    [provider]  clopidogrel (PLAVIX) 75 MG tablet TAKE 1 TABLET BY MOUTH EVERY DAY 09/03/17   Martinique, Peter M, MD  cyclobenzaprine (FLEXERIL) 10 MG tablet Take 1 tablet (10 mg total) by mouth at bedtime. Patient taking differently: Take 10 mg by mouth as needed for muscle spasms.  10/15/16   Panwala, Naitik, PA-C  dexlansoprazole (DEXILANT) 60 MG capsule TAKE 1 CAPSULE (60 MG TOTAL) BY MOUTH DAILY. Patient taking differently: TAKE 1 CAPSULE (60 MG TOTAL) BY MOUTH DAILY IN THE EVENING 04/19/17   Tower, [name], MD  diphenhydrAMINE (BENADRYL) 25 mg capsule Take 2 capsules in AM and 1 capsule at night Patient taking differently: Take 50 mg by mouth 2 (two) times daily as needed for itching or allergies.  08/19/13   Tower, [name], MD  fluticasone (FLONASE) 50 MCG/ACT nasal spray PLACE 2 SPRAYS INTO BOTH NOSTRILS DAILY. Patient taking differently: Place 2 sprays into both nostrils daily as needed for allergies.  03/05/17   [name], MD  furosemide (LASIX) 40 MG tablet TAKE 1 TABLET BY MOUTH EVERY DAY 11/06/17   Martinique, Peter M, MD    gabapentin (NEURONTIN) 300 MG capsule TAKE 1 CAPSULE (300 MG TOTAL) BY MOUTH 3 (THREE) TIMES DAILY. 12/17/17   Tomi Likens, Adam R, DO  glimepiride (AMARYL) 4 MG tablet Take 4 mg by mouth 2 (two) times daily.  03/09/15   [provider]  guaiFENesin (MUCINEX) 600 MG 12 hr tablet Take 2 tablets (1,200 mg total) by mouth 2 (two) times daily. 08/19/13   Tower, [name], MD  hydrochlorothiazide (HYDRODIURIL) 50 MG tablet Take 25 mg by mouth daily.    [provider]  hydrochlorothiazide (HYDRODIURIL) 50 MG tablet TAKE 1 TABLET BY MOUTH EVERY DAY 11/06/17   Martinique, Peter M, MD  isosorbide mononitrate (IMDUR) 30 MG 24 hr tablet TAKE 1 TABLET BY MOUTH EVERY DAY 12/17/17   Martinique, Peter M, MD  KLOR-CON M10 10 MEQ tablet TAKE 1 TABLET BY MOUTH TWICE A DAY 11/06/17   Martinique, Peter M, MD  levETIRAcetam (KEPPRA) 1000 MG tablet TAKE 1 TABLET BY MOUTH TWICE A DAY 10/11/17   Jaffe, Adam R, DO  losartan (COZAAR) 100 MG tablet TAKE 1 TABLET (100 MG TOTAL) BY MOUTH DAILY. 11/06/17 11/06/18  Martinique, Peter M, MD  metFORMIN (GLUCOPHAGE-XR) 500 MG 24 hr tablet Take 500 mg by mouth 2 (two) times daily. 08/30/17   [provider]  nicotine (NICODERM CQ - DOSED IN MG/24 HOURS) 21 mg/24hr patch Place 21 mg onto the skin daily as needed (smoking cessation).     [provider]  nitroGLYCERIN (NITROLINGUAL) 0.4 MG/SPRAY spray PLACE 1 SPRAY UNDER THE TONGUE EVERY 5 (FIVE) MINUTES X 3 DOSES AS NEEDED FOR CHEST PAIN. 03/04/17   Martinique, Peter M, MD  ONE TOUCH ULTRA TEST test strip USE AS DIRECTED FOR TESTING BLOOD GLUCOSE 3 TIMES DAILY 08/07/16   [provider]  oxyCODONE (OXY IR/ROXICODONE) 5 MG immediate release tablet Take 1-2 tablets (5-10 mg total) by mouth every 4 (four) hours as needed for moderate pain. 10/11/17   Ulyses Amor, PA-C  polyethylene glycol powder (GLYCOLAX/MIRALAX) powder TAKE 1 DOSE 17 GRAMS IN  8 OZ OF WATER DAILY AS NEEDED FOR CONSTIPATION. 11/15/17   Zehr, Laban Emperor, PA-C   ranolazine (RANEXA) 1000 MG SR tablet Take 1 tablet (1,000 mg total) by mouth 2 (two) times daily. 11/21/16   Martinique, Peter M, MD  rosuvastatin (CRESTOR) 10 MG tablet TAKE 1 TABLET BY MOUTH DAILY 07/22/17   Lorretta Harp, MD  sodium chloride (OCEAN) 0.65 % SOLN nasal spray Place 1 spray into both nostrils as needed for congestion.    [provider]    Family History Family History  Problem Relation Age of Onset  . Heart disease Mother   . Diabetes Mother   . COPD Mother   . Hyperlipidemia Mother   . Hypertension Mother   . Cancer Father        met, origin unknown  . Heart attack Father   . Drug abuse Paternal Grandmother   . Stroke Paternal Grandfather   . Lung cancer Paternal Aunt        lung with mets to brain  . Melanoma Paternal Uncle   . Lung cancer Paternal Uncle        lung/liver to brain  . Cancer Paternal Uncle        cancer of unknown type  . Heart disease Maternal Grandfather   . Hypertension Sister   . Cancer Sister        eyelid  . Glaucoma Sister   . Heart disease Maternal Aunt        x 2 aunts    Social History Social History   Tobacco Use  . Smoking status: Current Every Day Smoker    Packs/day: 1.50    Years: 50.00    Pack years: 75.00    Types: Cigarettes  . Smokeless tobacco: Never Used  . Tobacco comment: 1.5 pack to 2 packs per day  Substance Use Topics  . Alcohol use: No    Alcohol/week: 0.6 oz    Types: 1 Standard drinks or equivalent per week  . Drug use: No     Allergies   Bee venom; Entresto [sacubitril-valsartan]; Tetracycline; and Ace inhibitors   Review of Systems Review of Systems  All other systems reviewed and are negative.    Physical Exam Updated Vital Signs BP 96/74 (BP Location: Right Arm)   Pulse 68   Temp 98.3 F (36.8 C) (Oral)   Resp 20   Ht 1.473 m (_0 )   Wt 64.5 kg (142 lb 4 oz)   LMP 09/02/2003   SpO2 97%   BMI 29.73 kg/m   Physical Exam  Constitutional: She appears  well-developed and well-nourished. No distress.  HENT:  Head: Normocephalic and atraumatic.  Right Ear: External ear normal.  Left Ear: External ear normal.  Eyes: Conjunctivae are normal. Right eye exhibits no discharge. Left eye exhibits no discharge. No scleral icterus.  Neck: Neck supple. No tracheal deviation present.  Cardiovascular: Normal rate, regular rhythm and intact distal pulses.  Pulmonary/Chest: [place] normal and breath sounds normal. No stridor. No respiratory distress. She has no wheezes. She has no rales.  Abdominal: Soft. Bowel sounds are normal. She exhibits no distension. There is no tenderness. There is no rebound and no guarding.  Musculoskeletal: She exhibits tenderness. She exhibits no edema.  Tenderness to palpation, abrasion right knee, mild tenderness palpation left wrist without any erythema or edema  Neurological: She is alert. She has normal strength. No cranial nerve deficit (no facial droop, extraocular movements intact, no slurred speech) or sensory  deficit. She exhibits normal muscle tone. She displays no seizure activity. Coordination normal.  Skin: Skin is warm and dry. No rash noted.  Psychiatric: She has a normal mood and affect.  Nursing note and vitals reviewed.    ED Treatments / Results  Labs (all labs ordered are listed, but only abnormal results are displayed) Labs Reviewed - No data to display  EKG None  Radiology Dg Shoulder Right  Result Date: 01/13/2018 CLINICAL DATA:  Fall with shoulder pain EXAM: RIGHT SHOULDER - 2+ VIEW COMPARISON:  None. FINDINGS: Mild AC joint degenerative changes. No acute fracture or dislocation. IMPRESSION: No acute osseous abnormality Electronically Signed   By: Donavan Foil M.D.   On: 01/13/2018 22:55   Dg Wrist Complete Left  Result Date: 01/13/2018 CLINICAL DATA:  Fall with wrist pain EXAM: LEFT WRIST - COMPLETE 3+ VIEW COMPARISON:  None. FINDINGS: Surgical clips in the distal forearm. Small fracture  fragments dorsal aspect of the wrist. No subluxation. IMPRESSION: Small fracture fragments along the dorsal aspect of the wrist, suspect for triquetral fracture Electronically Signed   By: Donavan Foil M.D.   On: 01/13/2018 22:58   Dg Knee 2 Views Right  Result Date: 01/13/2018 CLINICAL DATA:  Fall with pain EXAM: RIGHT KNEE - 1-2 VIEW COMPARISON:  None. FINDINGS: Vascular calcifications. Minimal patellar spurring. No large knee effusion. No acute displaced fracture. Joint spaces are maintained. IMPRESSION: No acute osseous abnormality. Electronically Signed   By: Donavan Foil M.D.   On: 01/13/2018 22:59   Dg Hip Unilat  With Pelvis 2-3 Views Right  Result Date: 01/13/2018 CLINICAL DATA:  Fall with hip pain EXAM: DG HIP (WITH OR WITHOUT PELVIS) 2-3V RIGHT COMPARISON:  CT 02/18/2017 FINDINGS: Mild SI joint degenerative changes. Pubic symphysis and rami are intact. Extensive vascular calcifications. Soft tissue calcifications over both hips. No fracture or malalignment. Chronic deformity at the left iliac crest. IMPRESSION: No acute osseous abnormality Electronically Signed   By: Donavan Foil M.D.   On: 01/13/2018 22:55    Procedures Procedures (including critical care time)  Medications Ordered in ED Medications - No data to display   Initial Impression / Assessment and Plan / ED Course  I have reviewed the triage vital signs and the nursing notes.  Pertinent labs & imaging results that were available during my care of the patient were reviewed by me and considered in my medical decision making (see chart for details).   Patient's x-rays are negative with the exception of her left wrist.  It suggest the possibility of a carpal bone fracture.  Patient states she fractured her wrist in the past.  I wonder if that small fragment is from an old fracture rather than an acute fracture as she does not seem to have any discrete tenderness.  Plan on discharge home with a splint.  Obstipation  follow-up with orthopedics    Final Clinical Impressions(s) / ED Diagnoses   Final diagnoses:  Fall, initial encounter  Contusion of right knee, initial encounter  Wrist injury, left, initial encounter    ED Discharge Orders    None       Dorie Rank, MD 01/14/18 1001

## 2018-01-14 NOTE — Discharge Instructions (Addendum)
Ice the areas to help with the swelling, take over the counter medications as needed for pain, wear the splint and follow up with an orthopedic doctor to evaluate the possible wrist fracture as we discussed

## 2018-01-14 NOTE — ED Notes (Signed)
4 x 4 applied to abrasion rt knee and wrist splint applied to left wrist

## 2018-01-14 NOTE — ED Notes (Signed)
States fell on wet floor when she stopped at restaurant to get take out, hurt rt knee , rt hip and left wrist

## 2018-01-20 ENCOUNTER — Other Ambulatory Visit: Payer: Self-pay | Admitting: Pulmonary Disease

## 2018-01-21 ENCOUNTER — Encounter: Payer: Self-pay | Admitting: Cardiology

## 2018-01-21 ENCOUNTER — Ambulatory Visit (INDEPENDENT_AMBULATORY_CARE_PROVIDER_SITE_OTHER): Payer: Medicare Other | Admitting: Cardiology

## 2018-01-21 VITALS — BP 140/72 | HR 74 | Ht <= 58 in | Wt 145.0 lb

## 2018-01-21 DIAGNOSIS — I255 Ischemic cardiomyopathy: Secondary | ICD-10-CM | POA: Insufficient documentation

## 2018-01-21 DIAGNOSIS — J449 Chronic obstructive pulmonary disease, unspecified: Secondary | ICD-10-CM | POA: Diagnosis not present

## 2018-01-21 DIAGNOSIS — I739 Peripheral vascular disease, unspecified: Secondary | ICD-10-CM | POA: Diagnosis not present

## 2018-01-21 DIAGNOSIS — I6521 Occlusion and stenosis of right carotid artery: Secondary | ICD-10-CM

## 2018-01-21 DIAGNOSIS — E119 Type 2 diabetes mellitus without complications: Secondary | ICD-10-CM | POA: Diagnosis not present

## 2018-01-21 DIAGNOSIS — I10 Essential (primary) hypertension: Secondary | ICD-10-CM | POA: Diagnosis not present

## 2018-01-21 DIAGNOSIS — Z951 Presence of aortocoronary bypass graft: Secondary | ICD-10-CM

## 2018-01-21 MED ORDER — ISOSORBIDE MONONITRATE ER 60 MG PO TB24: 60.0000 mg | ORAL_TABLET | Freq: Every day | ORAL | 3 refills | Status: DC

## 2018-01-21 NOTE — Progress Notes (Signed)
01/21/2018 Diane [name]   Jan 01, 1956  [id number]  Primary Physician Tower, [name], MD Primary Cardiologist: Dr Martinique- Gen Cardiology/ Dr Carney Bern  HPI:  62 y/o female followed by Dr Martinique, referred to Dr Gwenlyn Found for carotid stenosis in Jan 2019. She underwent RCA PTA without complication, she was not felt to be a surgical candidate secondary to multiple cardia co- morbidities. She is in the office today for a follow up. She tells me she recently fell on a wet floor in a restaurant, and fractured her Lt wrist. She has not seen the orthopedist yet. From a cardiac standpoint she admits to having chest pain 1-2 times a week for which she takes SL NTG with relief. Her symptoms are not always exertional. No associated nausea vomiting or diaphoresis. No chest pain that wakes her from sleep.    Current Outpatient Medications  Medication Sig Dispense Refill  . acetaminophen (TYLENOL) 500 MG tablet Take 1,000 mg by mouth every 6 (six) hours as needed for moderate pain or headache.    . albuterol (VENTOLIN HFA) 108 (90 Base) MCG/ACT inhaler INHALER 2 PUFFS EVERY 6 HOURS AS NEEDED FOR WHEEZING/SHORTNESS OF BREATH 1 Inhaler 0  . aspirin EC 81 MG tablet Take 81 mg by mouth every evening.  30 tablet 6  . carvedilol (COREG) 25 MG tablet TAKE 1 TABLET (25 MG TOTAL) BY MOUTH 2 (TWO) TIMES DAILY WITH A MEAL. 180 tablet 3  . cetirizine (ZYRTEC) 10 MG tablet Take 10 mg by mouth daily as needed for allergies.    [name] clopidogrel (PLAVIX) 75 MG tablet TAKE 1 TABLET BY MOUTH EVERY DAY 90 tablet 3  . cyclobenzaprine (FLEXERIL) 10 MG tablet Take 1 tablet (10 mg total) by mouth at bedtime. (Patient taking differently: Take 10 mg by mouth as needed for muscle spasms. ) 90 tablet 1  . dexlansoprazole (DEXILANT) 60 MG capsule TAKE 1 CAPSULE (60 MG TOTAL) BY MOUTH DAILY. (Patient taking differently: TAKE 1 CAPSULE (60 MG TOTAL) BY MOUTH DAILY IN THE EVENING) 30 capsule 11  . diphenhydrAMINE (BENADRYL) 25 mg capsule Take 2  capsules in AM and 1 capsule at night (Patient taking differently: Take 50 mg by mouth 2 (two) times daily as needed for itching or allergies. ) 270 capsule 3  . fluticasone (FLONASE) 50 MCG/ACT nasal spray PLACE 2 SPRAYS INTO BOTH NOSTRILS DAILY. (Patient taking differently: Place 2 sprays into both nostrils daily as needed for allergies. ) 16 g 5  . furosemide (LASIX) 40 MG tablet TAKE 1 TABLET BY MOUTH EVERY DAY 90 tablet 3  . gabapentin (NEURONTIN) 300 MG capsule TAKE 1 CAPSULE (300 MG TOTAL) BY MOUTH 3 (THREE) TIMES DAILY. 270 capsule 3  . glimepiride (AMARYL) 2 MG tablet Take 2 mg by mouth 2 (two) times daily.  6  . guaiFENesin (MUCINEX) 600 MG 12 hr tablet Take 2 tablets (1,200 mg total) by mouth 2 (two) times daily. 360 tablet 3  . hydrochlorothiazide (HYDRODIURIL) 50 MG tablet Take 25 mg by mouth daily.    . isosorbide mononitrate (IMDUR) 60 MG 24 hr tablet Take 1 tablet (60 mg total) by mouth daily. 30 tablet 3  . KLOR-CON M10 10 MEQ tablet TAKE 1 TABLET BY MOUTH TWICE A DAY 180 tablet 3  . levETIRAcetam (KEPPRA) 1000 MG tablet TAKE 1 TABLET BY MOUTH TWICE A DAY 60 tablet 5  . losartan (COZAAR) 100 MG tablet TAKE 1 TABLET (100 MG TOTAL) BY MOUTH DAILY. 90 tablet 3  .  metFORMIN (GLUCOPHAGE-XR) 500 MG 24 hr tablet Take 500 mg by mouth 2 (two) times daily.  6  . nitroGLYCERIN (NITROLINGUAL) 0.4 MG/SPRAY spray PLACE 1 SPRAY UNDER THE TONGUE EVERY 5 (FIVE) MINUTES X 3 DOSES AS NEEDED FOR CHEST PAIN. 12 g 3  . ONE TOUCH ULTRA TEST test strip USE AS DIRECTED FOR TESTING BLOOD GLUCOSE 3 TIMES DAILY  1  . polyethylene glycol powder (GLYCOLAX/MIRALAX) powder TAKE 1 DOSE 17 GRAMS IN 8 OZ OF WATER DAILY AS NEEDED FOR CONSTIPATION. 527 g 3  . ranolazine (RANEXA) 1000 MG SR tablet Take 1 tablet (1,000 mg total) by mouth 2 (two) times daily. 180 tablet 3  . rosuvastatin (CRESTOR) 10 MG tablet TAKE 1 TABLET BY MOUTH DAILY 90 tablet 3  . sodium chloride (OCEAN) 0.65 % SOLN nasal spray Place 1 spray into  both nostrils as needed for congestion.    [name] BEVESPI AEROSPHERE 9-4.8 MCG/ACT AERO TAKE 2 PUFFS BY MOUTH TWICE A DAY (Patient not taking: Reported on 01/14/2018) 10.7 Inhaler 5  . oxyCODONE (OXY IR/ROXICODONE) 5 MG immediate release tablet Take 1-2 tablets (5-10 mg total) by mouth every 4 (four) hours as needed for moderate pain. (Patient not taking: Reported on 01/14/2018) 6 tablet 0   No current facility-administered medications for this visit.     Allergies  Allergen Reactions  . Bee Venom Itching and Swelling  . Entresto [Sacubitril-Valsartan] Cough  . Tetracycline     Unknown   . Ace Inhibitors Cough    Past Medical History:  Diagnosis Date  . Anemia   . Carotid stenosis    a. s/p right carotid stent 10/2017.  [name] Chronic systolic CHF (congestive heart failure) ([place])   . Colon polyps   . COPD (chronic obstructive pulmonary disease) ([place])   . Coronary artery disease    post CABG in 3/07 , coronary stents   . Diabetes mellitus    type 2  . Dyslipidemia   . Fibromyalgia   . GERD (gastroesophageal reflux disease)   . Headache    hx of   . Hyperlipidemia   . Hypertension   . Myocardial infarction ([place])   . Pneumonia    hx of   . Seizures ([place])    last seizure- 03/2013   . Shortness of breath dyspnea    with exertion or when fluid builds up   . Sleep apnea    used to wear a cpap- not used in 3 years   . Status post dilation of esophageal narrowing   . Stroke [facility]) 1993   problems with balance   . Systolic murmur    known mild AS and MR  . Thyroid goiter   . Tobacco abuse     Social History   Socioeconomic History  . Marital status: Divorced    Spouse name: Not on file  . Number of children: 0  . Years of education: Not on file  . Highest education level: Not on file  Occupational History  . Occupation: disabled  Social Needs  . Financial resource strain: Not on file  . Food insecurity:    Worry: Not on file    Inability: Not on file  . Transportation  needs:    Medical: Not on file    Non-medical: Not on file  Tobacco Use  . Smoking status: Current Every Day Smoker    Packs/day: 1.50    Years: 50.00    Pack years: 75.00    Types: Cigarettes  . Smokeless  tobacco: Never Used  . Tobacco comment: 1.5 pack to 2 packs per day  Substance and Sexual Activity  . Alcohol use: No    Alcohol/week: 0.6 oz    Types: 1 Standard drinks or equivalent per week  . Drug use: No  . Sexual activity: Not Currently    Partners: Male    Birth control/protection: None  Lifestyle  . Physical activity:    Days per week: Not on file    Minutes per session: Not on file  . Stress: Not on file  Relationships  . Social connections:    Talks on phone: Not on file    Gets together: Not on file    Attends religious service: Not on file    Active member of club or organization: Not on file    Attends meetings of clubs or organizations: Not on file    Relationship status: Not on file  . Intimate partner violence:    Fear of current or ex partner: Not on file    Emotionally abused: Not on file    Physically abused: Not on file    Forced sexual activity: Not on file  Other Topics Concern  . Not on file  Social History Narrative  . Not on file     Family History  Problem Relation Age of Onset  . Heart disease Mother   . Diabetes Mother   . COPD Mother   . Hyperlipidemia Mother   . Hypertension Mother   . Cancer Father        met, origin unknown  . Heart attack Father   . Drug abuse Paternal Grandmother   . Stroke Paternal Grandfather   . Lung cancer Paternal Aunt        lung with mets to brain  . Melanoma Paternal Uncle   . Lung cancer Paternal Uncle        lung/liver to brain  . Cancer Paternal Uncle        cancer of unknown type  . Heart disease Maternal Grandfather   . Hypertension Sister   . Cancer Sister        eyelid  . Glaucoma Sister   . Heart disease Maternal Aunt        x 2 aunts     Review of Systems: General: negative  for chills, fever, night sweats or weight changes.  Cardiovascular: negative for chest pain, dyspnea on exertion, edema, orthopnea, palpitations, paroxysmal nocturnal dyspnea or shortness of breath Dermatological: negative for rash Respiratory: negative for cough or wheezing Urologic: negative for hematuria Abdominal: negative for nausea, vomiting, diarrhea, bright red blood per rectum, melena, or hematemesis Neurologic: negative for visual changes, syncope, or dizziness All other systems reviewed and are otherwise negative except as noted above.    Blood pressure 140/72, pulse 74, height _0  (1.473 m), weight 145 lb (65.8 kg), last menstrual period 09/02/2003.  General appearance: alert, cooperative and no distress Neck: no carotid bruit and no JVD Lungs: clear to auscultation bilaterally Heart: regular rate and rhythm Skin: Skin color, texture, turgor normal. No rashes or lesions Neurologic: Grossly normal    ASSESSMENT AND PLAN:   Carotid artery disease (HCC) Bilateral carotid artery stenosis S/P RCA stent by Dr Gwenlyn Found 10/10/17  Hx of CABG Status post CABG in March of 2007 LIMA-LAD, LtRA-RI, SVG-OM1, SVG-PDA  She is s/p PCI with DES to OM 01 Aug 2006.  Cath 2016- all SVGs occluded, patent LIMA-LAD- medical Rx  Ischemic cardiomyopathy EF 35% 2016  echo  COPD with chronic bronchitis ([place]) Pt continues to smoke 1.5 ppd  Essential hypertension Controlled  Non-insulin treated type 2 diabetes mellitus ([place]) On oral agents- renal function WNL   PLAN  I suggested she try increasing her Imdur to 60 mg daily. I told her our goal is for her to not have to use SL NTG. We discussed smoking and she has agreed to try and cut back with the goal to eventually quit altogether.   Kerin Ransom PA-C 01/21/2018 2:29 PM

## 2018-01-21 NOTE — Assessment & Plan Note (Signed)
Controlled.  

## 2018-01-21 NOTE — Assessment & Plan Note (Signed)
Pt continues to smoke 1.5 ppd

## 2018-01-21 NOTE — Assessment & Plan Note (Signed)
Bilateral carotid artery stenosis S/P RCA stent by Dr Gwenlyn Found 10/10/17

## 2018-01-21 NOTE — Assessment & Plan Note (Signed)
On oral agents- renal function WNL

## 2018-01-21 NOTE — Assessment & Plan Note (Signed)
EF 35% 2016 echo

## 2018-01-21 NOTE — Assessment & Plan Note (Addendum)
Status post CABG in March of 2007 LIMA-LAD, LtRA-RI, SVG-OM1, SVG-PDA  She is s/p PCI with DES to OM 01 Aug 2006.  Cath 2016- all SVGs occluded, patent LIMA-LAD- medical Rx

## 2018-01-21 NOTE — Patient Instructions (Addendum)
Medication Instructions:  INCREASE Imdur to 60mg  take 1 tablet once a day( Per Lurena Joiner take 2 of the 30mg  tabs and if it works then start the 60mg )  Labwork: nonen  Testing/Procedures: PLEASE RESCHEDULE LEA DOPPLERS  Follow-Up: Your physician recommends that you schedule a follow-up appointment in: JULY/JUNE  DR Martinique ONLY.  Any Other Special Instructions Will Be Listed Below (If Applicable).  If you need a refill on your cardiac medications before your next appointment, please call your pharmacy.

## 2018-01-23 ENCOUNTER — Other Ambulatory Visit: Payer: Self-pay | Admitting: Cardiology

## 2018-01-23 NOTE — Telephone Encounter (Signed)
REFILL 

## 2018-01-24 ENCOUNTER — Encounter: Payer: Self-pay | Admitting: Neurology

## 2018-01-28 ENCOUNTER — Other Ambulatory Visit: Payer: Self-pay | Admitting: Cardiology

## 2018-01-28 DIAGNOSIS — I739 Peripheral vascular disease, unspecified: Secondary | ICD-10-CM

## 2018-01-29 ENCOUNTER — Ambulatory Visit (HOSPITAL_COMMUNITY)
Admission: RE | Admit: 2018-01-29 | Discharge: 2018-01-29 | Disposition: A | Discharge status: Home / Self Care | Payer: Medicare Other | Source: Ambulatory Visit | Attending: Cardiovascular Disease | Admitting: Cardiovascular Disease

## 2018-01-29 ENCOUNTER — Encounter: Payer: Self-pay | Admitting: Gastroenterology

## 2018-01-29 DIAGNOSIS — F17200 Nicotine dependence, unspecified, uncomplicated: Secondary | ICD-10-CM | POA: Diagnosis not present

## 2018-01-29 DIAGNOSIS — Z951 Presence of aortocoronary bypass graft: Secondary | ICD-10-CM | POA: Insufficient documentation

## 2018-01-29 DIAGNOSIS — I739 Peripheral vascular disease, unspecified: Secondary | ICD-10-CM | POA: Diagnosis not present

## 2018-01-29 DIAGNOSIS — E785 Hyperlipidemia, unspecified: Secondary | ICD-10-CM | POA: Diagnosis not present

## 2018-01-29 DIAGNOSIS — I10 Essential (primary) hypertension: Secondary | ICD-10-CM | POA: Insufficient documentation

## 2018-01-29 DIAGNOSIS — E1151 Type 2 diabetes mellitus with diabetic peripheral angiopathy without gangrene: Secondary | ICD-10-CM | POA: Insufficient documentation

## 2018-01-29 DIAGNOSIS — S62115A Nondisplaced fracture of triquetrum [cuneiform] bone, left wrist, initial encounter for closed fracture: Secondary | ICD-10-CM | POA: Diagnosis not present

## 2018-01-29 DIAGNOSIS — S63502A Unspecified sprain of left wrist, initial encounter: Secondary | ICD-10-CM | POA: Diagnosis not present

## 2018-02-04 ENCOUNTER — Telehealth: Payer: Self-pay | Admitting: Cardiology

## 2018-02-04 NOTE — Telephone Encounter (Signed)
New message  ° ° °Patient calling back for test results.  °

## 2018-02-04 NOTE — Telephone Encounter (Signed)
Spoke to patient result given for dopplers reviewed by [facility] Verbalized understanding.

## 2018-02-06 ENCOUNTER — Telehealth: Payer: Self-pay

## 2018-02-06 DIAGNOSIS — M25551 Pain in right hip: Secondary | ICD-10-CM

## 2018-02-06 NOTE — Telephone Encounter (Signed)
Copied from [place] [id number]. Topic: Referral - Request >> Feb 06, 2018  3:43 PM Scherrie Gerlach wrote: Reason for CRM: pt requesting referral to Dr Micheline Chapman with [facility].  Pt state she had a fall a couple of weeks ago and having right hip pain. Not been to their office in over 3 yrs  If you make the appt, something around lunch will be good.

## 2018-02-07 DIAGNOSIS — M25551 Pain in right hip: Secondary | ICD-10-CM | POA: Insufficient documentation

## 2018-02-07 NOTE — Telephone Encounter (Signed)
Ref done Will route to pcc  

## 2018-02-12 NOTE — Telephone Encounter (Signed)
Appt made with Dr Noemi Chapel and patient aware.

## 2018-02-17 DIAGNOSIS — M545 Low back pain: Secondary | ICD-10-CM | POA: Diagnosis not present

## 2018-02-26 DIAGNOSIS — S62115D Nondisplaced fracture of triquetrum [cuneiform] bone, left wrist, subsequent encounter for fracture with routine healing: Secondary | ICD-10-CM | POA: Diagnosis not present

## 2018-02-26 DIAGNOSIS — S63502A Unspecified sprain of left wrist, initial encounter: Secondary | ICD-10-CM | POA: Diagnosis not present

## 2018-03-03 ENCOUNTER — Encounter: Payer: Self-pay | Admitting: Family Medicine

## 2018-03-03 ENCOUNTER — Ambulatory Visit (INDEPENDENT_AMBULATORY_CARE_PROVIDER_SITE_OTHER): Payer: Medicare Other | Admitting: Family Medicine

## 2018-03-03 VITALS — BP 136/74 | HR 76 | Temp 97.6°F | Ht <= 58 in | Wt 140.8 lb

## 2018-03-03 DIAGNOSIS — F17200 Nicotine dependence, unspecified, uncomplicated: Secondary | ICD-10-CM

## 2018-03-03 DIAGNOSIS — E871 Hypo-osmolality and hyponatremia: Secondary | ICD-10-CM | POA: Diagnosis not present

## 2018-03-03 DIAGNOSIS — E538 Deficiency of other specified B group vitamins: Secondary | ICD-10-CM

## 2018-03-03 DIAGNOSIS — I6521 Occlusion and stenosis of right carotid artery: Secondary | ICD-10-CM

## 2018-03-03 DIAGNOSIS — J301 Allergic rhinitis due to pollen: Secondary | ICD-10-CM

## 2018-03-03 DIAGNOSIS — J4489 Other specified chronic obstructive pulmonary disease: Secondary | ICD-10-CM

## 2018-03-03 DIAGNOSIS — E1142 Type 2 diabetes mellitus with diabetic polyneuropathy: Secondary | ICD-10-CM

## 2018-03-03 DIAGNOSIS — Z794 Long term (current) use of insulin: Secondary | ICD-10-CM

## 2018-03-03 DIAGNOSIS — I255 Ischemic cardiomyopathy: Secondary | ICD-10-CM

## 2018-03-03 DIAGNOSIS — R569 Unspecified convulsions: Secondary | ICD-10-CM | POA: Diagnosis not present

## 2018-03-03 DIAGNOSIS — I5042 Chronic combined systolic (congestive) and diastolic (congestive) heart failure: Secondary | ICD-10-CM

## 2018-03-03 DIAGNOSIS — J449 Chronic obstructive pulmonary disease, unspecified: Secondary | ICD-10-CM

## 2018-03-03 DIAGNOSIS — R42 Dizziness and giddiness: Secondary | ICD-10-CM

## 2018-03-03 LAB — COMPREHENSIVE METABOLIC PANEL
ALT: 10 U/L (ref 0–35)
AST: 7 U/L (ref 0–37)
Albumin: 3.9 g/dL (ref 3.5–5.2)
Alkaline Phosphatase: 60 U/L (ref 39–117)
BUN: 21 mg/dL (ref 6–23)
CO2: 30 mEq/L (ref 19–32)
Calcium: 9.6 mg/dL (ref 8.4–10.5)
Chloride: 98 mEq/L (ref 96–112)
Creatinine, Ser: 0.7 mg/dL (ref 0.40–1.20)
GFR: 89.55 mL/min (ref >60.00)
Glucose, Bld: 222 mg/dL — ABNORMAL HIGH (ref 70–99)
Potassium: 4.7 mEq/L (ref 3.5–5.1)
Sodium: 135 mEq/L (ref 135–145)
Total Bilirubin: 0.3 mg/dL (ref 0.2–1.2)
Total Protein: 6.5 g/dL (ref 6.0–8.3)

## 2018-03-03 LAB — CBC WITH DIFFERENTIAL/PLATELET
Basophils Absolute: 0 10*3/uL (ref 0.0–0.1)
Basophils Relative: 0.3 % (ref 0.0–3.0)
Eosinophils Absolute: 0 10*3/uL (ref 0.0–0.7)
Eosinophils Relative: 0.1 % (ref 0.0–5.0)
HCT: 38.4 % (ref 36.0–46.0)
Hemoglobin: 12.6 g/dL (ref 12.0–15.0)
Lymphocytes Relative: 15.7 % (ref 12.0–46.0)
Lymphs Abs: 1.4 10*3/uL (ref 0.7–4.0)
MCHC: 32.9 g/dL (ref 30.0–36.0)
MCV: 78.5 fl (ref 78.0–100.0)
Monocytes Absolute: 0.6 10*3/uL (ref 0.1–1.0)
Monocytes Relative: 6.2 % (ref 3.0–12.0)
Neutro Abs: 7 10*3/uL (ref 1.4–7.7)
Neutrophils Relative %: 77.7 % — ABNORMAL HIGH (ref 43.0–77.0)
Platelets: 249 10*3/uL (ref 150.0–400.0)
RBC: 4.89 Mil/uL (ref 3.87–5.11)
RDW: 16.6 % — ABNORMAL HIGH (ref 11.5–15.5)
WBC: 9 10*3/uL (ref 4.0–10.5)

## 2018-03-03 LAB — VITAMIN B12: Vitamin B-12: 249 pg/mL (ref 211–911)

## 2018-03-03 MED ORDER — FLUTICASONE PROPIONATE 50 MCG/ACT NA SUSP: 2.0000 | Freq: Every day | NASAL | 5 refills | Status: DC

## 2018-03-03 NOTE — Assessment & Plan Note (Signed)
Continue endo f/u  No longer having hypoglycemia

## 2018-03-03 NOTE — Assessment & Plan Note (Signed)
S/p stent in Jan  Doubt adding to dizziness

## 2018-03-03 NOTE — Assessment & Plan Note (Signed)
Enc flonase every day  In case of ETD- poss vertigo  Update if not improved

## 2018-03-03 NOTE — Assessment & Plan Note (Signed)
None lately  Will f/u with neuro as planned and mention dizzy spells CT reviewed

## 2018-03-03 NOTE — Assessment & Plan Note (Signed)
Having spells of weakness/fatigue Check level today

## 2018-03-03 NOTE — Assessment & Plan Note (Signed)
pulm f/u soon Is sob on exertion  Continues to smoke ? If hypoxia adds to her dizzy spells Using 02 at night

## 2018-03-03 NOTE — Assessment & Plan Note (Signed)
Lab today -on lasix and hctz

## 2018-03-03 NOTE — Assessment & Plan Note (Signed)
Suspect multifactorial in pt with vasc dz and hx of CVA  Rev vasc hx and last CT scan No doubt small vessel dz (on plavix)  Reassuring exam/neuro  No recent seizures  Allergies and ETD could add to vertigo type symptoms- will start back on flonase  Labs today- hx of anemia  Will check tsh with endocrinologist and keep eye on blood glucose  No doubt seizure med will sedate- but no seizures  Consider PT for weakness and balance after labs return

## 2018-03-03 NOTE — Patient Instructions (Addendum)
Let's check some labs   Stay well hydrated Keep track of blood sugar during episodes Keep using your walker  Eat regular meals   We may consider physical therapy for strength after labs return   Follow up with neurology regarding this and chronic issues in July as planned

## 2018-03-03 NOTE — Progress Notes (Signed)
Subjective:    Patient ID: Diane [name], female    DOB: 03-07-1956, 62 y.o.   MRN: [id number]  HPI Here for symptom of feeling off balance and tired   Getting waves of weakness/dizziness (they last 20-74mnutes)  About 1-2 mo ago  Even when doing nothing  Some headaches occ - (more since her carotid stent)  No vision or hearing change (time for eye check)  No seizures  No sleep change No mood change   Hx of anemia  Hx of low na   No facial droop or speech problems   Smoking = the same - planning on trying nicotine patch Also charging her e cig (not using yet)    Wt Readings from Last 3 Encounters:  03/03/18 140 lb 12 oz (63.8 kg)  01/21/18 145 lb (65.8 kg)  01/13/18 142 lb 4 oz (64.5 kg)  trying to eat better / appetite is ok  29.42 kg/m   BP Readings from Last 3 Encounters:  03/03/18 136/74  01/21/18 140/72  01/14/18 140/80  no positional changes  Pulse Readings from Last 3 Encounters:  03/03/18 76  01/21/18 74  01/14/18 68   Pulse ox 94% on RA Uses 02 at night   Hx of vascular dz incl carotid dz (sp RCA stent 1/19) CHF HTN  Also copd/smoking Breathing - good days and bad days- due for pulm f/u   Seizure disorder-quiet   Head CT CT HEAD WO CONTRAST (Accession [id number] (Order [id number]  Imaging  Date: 10/10/2017 [facility] Released By: SKandace Parkins RN (auto-released) Authorizing: CConrad Mesa MD  Exam Information   Status Exam Begun  Exam Ended   Final [99] 10/10/2017 5:39 PM 10/10/2017 5:49 PM  PACS Images   Show images for CT HEAD WO CONTRAST  Study Result   CLINICAL DATA:  Seizure and headache.  Prior stroke.  EXAM: CT HEAD WITHOUT CONTRAST  TECHNIQUE: Contiguous axial images were obtained from the base of the skull through the vertex without intravenous contrast.  COMPARISON:  MRI of the brain on 12/08/2015 and CT of the head on 03/14/2013  FINDINGS: Brain: Stable prior right  parietal infarct with stable region of encephalomalacia. The brain demonstrates no evidence of hemorrhage, acute infarction, edema, mass effect, extra-axial fluid collection, hydrocephalus or mass lesion.  Vascular: No hyperdense vessel or unexpected calcification.  Skull: Normal. Negative for fracture or focal lesion.  Sinuses/Orbits: No acute finding.  Other: None.  IMPRESSION: Stable old right parietal lobe infarct.  No acute findings.   Electronically Signed   By: GAletta EdouardM.D.   On: 10/10/2017 17:57   In general strength is worse  Uses a walker almost full time   Does not have low blood sugar during episodes  Dr BChalmers Catercut her glimiperide in 1/2 due to low sugar- that improved Is well controlled   Has chronic sinus issues  On flonase-needs a refill   Patient Active Problem List   Diagnosis Date Noted  . Dizzy spells 03/03/2018  . Right hip pain 02/07/2018  . Ischemic cardiomyopathy 01/21/2018  . Other constipation 05/07/2017  . RLQ abdominal pain 05/07/2017  . Colon cancer screening 04/19/2017  . Incisional hernia 01/14/2017  . Need for hepatitis C screening test 08/13/2016  . Smokers' cough ([place] 08/13/2016  . Chronic respiratory failure ([place] 07/11/2016  . COPD with chronic bronchitis ([place] 05/20/2016  . Sleep-related hypoventilation due to lower airway obstruction 05/20/2016  . Periodic limb movements  of sleep 05/20/2016  . CHF (congestive heart failure) ([place]) 05/08/2016  . Non-insulin treated type 2 diabetes mellitus ([place]) 12/13/2015  . B12 deficiency 12/01/2015  . History of CVA (cerebrovascular accident) 11/29/2015  . Abnormal nuclear stress test 05/06/2015  . 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 of localized onset, not intractable, without status epilepticus ([place]) 03/22/2015  . Cervical disc disorder with radiculopathy of cervical region 03/22/2015  . Complex partial seizures ([place]) 09/16/2013  .  Encounter for Medicare annual wellness exam 06/16/2013  . Antiplatelet or antithrombotic long-term use 06/08/2013  . Seizures ([place]) 03/25/2013  . Goiter 09/01/2012  . Other screening mammogram 06/13/2012  . Allergic rhinitis 06/13/2012  . Hyponatremia 12/11/2011  . Carotid artery disease ([place]) 08/20/2011  . DIZZINESS 07/26/2010  . Obstructive sleep apnea 09/18/2007  . TOBACCO USE 08/08/2007  . DIABETES MELLITUS, TYPE II 03/26/2007  . Hyperlipidemia 03/26/2007  . ANXIETY 03/26/2007  . DEPRESSION 03/26/2007  . MIGRAINE HEADACHE 03/26/2007  . CARPAL TUNNEL SYNDROME, BILATERAL 03/26/2007  . Essential hypertension 03/26/2007  . Hx of CABG 03/26/2007  . GERD 03/26/2007  . Claudication in peripheral vascular disease ([place]) 03/26/2007   Past Medical History:  Diagnosis Date  . Anemia   . Carotid stenosis    a. s/p right carotid stent 10/2017.  [name] Chronic systolic CHF (congestive heart failure) ([place])   . Colon polyps   . COPD (chronic obstructive pulmonary disease) ([place])   . Coronary artery disease    post CABG in 3/07 , coronary stents   . Diabetes mellitus    type 2  . Dyslipidemia   . Fibromyalgia   . GERD (gastroesophageal reflux disease)   . Headache    hx of   . Hyperlipidemia   . Hypertension   . Myocardial infarction ([place])   . Pneumonia    hx of   . Seizures ([place])    last seizure- 03/2013   . Shortness of breath dyspnea    with exertion or when fluid builds up   . Sleep apnea    used to wear a cpap- not used in 3 years   . Status post dilation of esophageal narrowing   . Stroke [facility]) 1993   problems with balance   . Systolic murmur    known mild AS and MR  . Thyroid goiter   . Tobacco abuse    Past Surgical History:  Procedure Laterality Date  . APPENDECTOMY    . CARDIAC CATHETERIZATION  11/29/05   EF of 55%  . CARDIAC CATHETERIZATION  08/06/06   EF of 45-50%  . CARDIAC CATHETERIZATION N/A 05/06/2015   Procedure: Right/Left Heart Cath and Coronary/Graft  Angiography;  Surgeon: Peter M Martinique, MD;  Location: [place] [facility] LAB;  Service: Cardiovascular;  Laterality: N/A;  . CAROTID PTA/STENT INTERVENTION N/A 10/10/2017   Procedure: CAROTID PTA/STENT INTERVENTION - Right;  Surgeon: Serafina Mitchell, MD;  Location: [place] CV LAB;  Service: Cardiovascular;  Laterality: N/A;  . CERVICAL FUSION  1990  . CHOLECYSTECTOMY    . COLON RESECTION     mass removed and 4 in of colon  . COLONOSCOPY WITH PROPOFOL N/A 07/16/2017   Procedure: COLONOSCOPY WITH PROPOFOL;  Surgeon: Ladene Artist, MD;  Location: [facility];  Service: Endoscopy;  Laterality: N/A;  . CORONARY ARTERY BYPASS GRAFT  12/04/2005   x5 -- left internal mammary artery to the LAD, left radial artery to the ramus intermedius, saphenous vein graft to the obtuse marginal 1, sequential  saphenous vein grat to the acute marginal and posterior descending, endoscopic vein harvesting from the left thigh with open vein harvest from right leg  . CORONARY STENT PLACEMENT  08/11/06   PCI of her ciurcumflex/OM vessel  . LAPAROTOMY Bilateral 05/19/2015   Procedure: EXPLORATORY LAPAROTOMY WITH BILATERAL SALPINGO OOPHORECTOMY /OMENTECTOMY/SEGMENTAL SIGMOID COLECTOMY ;  Surgeon: Everitt Amber, MD;  Location: [facility] OR[facility];  Service: Gynecology;  Laterality: Bilateral;  . PERIPHERAL VASCULAR CATHETERIZATION N/A 11/15/2015   Procedure: Carotid PTA/Stent Intervention;  Surgeon: Lorretta Harp, MD;  Location: [place] CV LAB;  Service: Cardiovascular;  Laterality: N/A;  . PERIPHERAL VASCULAR CATHETERIZATION  11/15/2015   Procedure: Carotid Angiography;  Surgeon: Lorretta Harp, MD;  Location: [place] CV LAB;  Service: Cardiovascular;;   Social History   Tobacco Use  . Smoking status: Current Every Day Smoker    Packs/day: 1.50    Years: 50.00    Pack years: 75.00    Types: Cigarettes  . Smokeless tobacco: Never Used  . Tobacco comment: 1.5 pack to 2 packs per day  Substance Use Topics  . Alcohol use:  No    Alcohol/week: 0.6 oz    Types: 1 Standard drinks or equivalent per week  . Drug use: No   Family History  Problem Relation Age of Onset  . Heart disease Mother   . Diabetes Mother   . COPD Mother   . Hyperlipidemia Mother   . Hypertension Mother   . Cancer Father        met, origin unknown  . Heart attack Father   . Drug abuse Paternal Grandmother   . Stroke Paternal Grandfather   . Lung cancer Paternal Aunt        lung with mets to brain  . Melanoma Paternal Uncle   . Lung cancer Paternal Uncle        lung/liver to brain  . Cancer Paternal Uncle        cancer of unknown type  . Heart disease Maternal Grandfather   . Hypertension Sister   . Cancer Sister        eyelid  . Glaucoma Sister   . Heart disease Maternal Aunt        x 2 aunts   Allergies  Allergen Reactions  . Bee Venom Itching and Swelling  . Entresto [Sacubitril-Valsartan] Cough  . Tetracycline     Unknown   . Ace Inhibitors Cough   Current Outpatient Medications on File Prior to Visit  Medication Sig Dispense Refill  . acetaminophen (TYLENOL) 500 MG tablet Take 1,000 mg by mouth every 6 (six) hours as needed for moderate pain or headache.    . albuterol (VENTOLIN HFA) 108 (90 Base) MCG/ACT inhaler INHALER 2 PUFFS EVERY 6 HOURS AS NEEDED FOR WHEEZING/SHORTNESS OF BREATH 1 Inhaler 0  . aspirin EC 81 MG tablet Take 81 mg by mouth every evening.  30 tablet 6  . BEVESPI AEROSPHERE 9-4.8 MCG/ACT AERO TAKE 2 PUFFS BY MOUTH TWICE A DAY 10.7 Inhaler 5  . carvedilol (COREG) 25 MG tablet TAKE 1 TABLET (25 MG TOTAL) BY MOUTH 2 (TWO) TIMES DAILY WITH A MEAL. 180 tablet 3  . cetirizine (ZYRTEC) 10 MG tablet Take 10 mg by mouth daily as needed for allergies.    [name] clopidogrel (PLAVIX) 75 MG tablet TAKE 1 TABLET BY MOUTH EVERY DAY 90 tablet 3  . dexlansoprazole (DEXILANT) 60 MG capsule TAKE 1 CAPSULE (60 MG TOTAL) BY MOUTH DAILY. (Patient taking differently: TAKE 1  CAPSULE (60 MG TOTAL) BY MOUTH DAILY IN THE EVENING)  30 capsule 11  . diphenhydrAMINE (BENADRYL) 25 mg capsule Take 2 capsules in AM and 1 capsule at night (Patient taking differently: Take 50 mg by mouth 2 (two) times daily as needed for itching or allergies. ) 270 capsule 3  . furosemide (LASIX) 40 MG tablet TAKE 1 TABLET BY MOUTH EVERY DAY 90 tablet 3  . gabapentin (NEURONTIN) 300 MG capsule TAKE 1 CAPSULE (300 MG TOTAL) BY MOUTH 3 (THREE) TIMES DAILY. 270 capsule 3  . glimepiride (AMARYL) 2 MG tablet Take 2 mg by mouth 2 (two) times daily.  6  . guaiFENesin (MUCINEX) 600 MG 12 hr tablet Take 2 tablets (1,200 mg total) by mouth 2 (two) times daily. 360 tablet 3  . hydrochlorothiazide (HYDRODIURIL) 50 MG tablet Take 25 mg by mouth daily.    . isosorbide mononitrate (IMDUR) 60 MG 24 hr tablet Take 1 tablet (60 mg total) by mouth daily. 30 tablet 3  . KLOR-CON M10 10 MEQ tablet TAKE 1 TABLET BY MOUTH TWICE A DAY 180 tablet 3  . levETIRAcetam (KEPPRA) 1000 MG tablet TAKE 1 TABLET BY MOUTH TWICE A DAY 60 tablet 5  . losartan (COZAAR) 100 MG tablet TAKE 1 TABLET (100 MG TOTAL) BY MOUTH DAILY. 90 tablet 3  . metFORMIN (GLUCOPHAGE-XR) 500 MG 24 hr tablet Take 500 mg by mouth 2 (two) times daily.  6  . nitroGLYCERIN (NITROLINGUAL) 0.4 MG/SPRAY spray PLACE 1 SPRAY UNDER THE TONGUE EVERY 5 (FIVE) MINUTES X 3 DOSES AS NEEDED FOR CHEST PAIN. 12 g 3  . ONE TOUCH ULTRA TEST test strip USE AS DIRECTED FOR TESTING BLOOD GLUCOSE 3 TIMES DAILY  1  . oxyCODONE (OXY IR/ROXICODONE) 5 MG immediate release tablet Take 1-2 tablets (5-10 mg total) by mouth every 4 (four) hours as needed for moderate pain. 6 tablet 0  . polyethylene glycol powder (GLYCOLAX/MIRALAX) powder TAKE 1 DOSE 17 GRAMS IN 8 OZ OF WATER DAILY AS NEEDED FOR CONSTIPATION. 527 g 3  . RANEXA 1000 MG SR tablet TAKE 1 TABLET BY MOUTH TWICE A DAY 180 tablet 1  . rosuvastatin (CRESTOR) 10 MG tablet TAKE 1 TABLET BY MOUTH DAILY 90 tablet 3  . sodium chloride (OCEAN) 0.65 % SOLN nasal spray Place 1 spray into  both nostrils as needed for congestion.     No current facility-administered medications on file prior to visit.      Review of Systems  Constitutional: Negative for activity change, appetite change, fatigue, fever and unexpected weight change.  HENT: Negative for congestion, ear pain, rhinorrhea, sinus pressure and sore throat.   Eyes: Negative for pain, redness and visual disturbance.  Respiratory: Positive for shortness of breath. Negative for cough, wheezing and stridor.   Cardiovascular: Negative for chest pain and palpitations.  Gastrointestinal: Negative for abdominal pain, blood in stool, constipation and diarrhea.  Endocrine: Negative for polydipsia and polyuria.  Genitourinary: Negative for dysuria, frequency and urgency.  Musculoskeletal: Negative for arthralgias, back pain and myalgias.  Skin: Negative for pallor and rash.  Allergic/Immunologic: Negative for environmental allergies.  Neurological: Positive for dizziness, weakness and light-headedness. Negative for tremors, seizures, syncope, facial asymmetry, speech difficulty, numbness and headaches.       Generalized (not focal) weakness  Hematological: Negative for adenopathy. Does not bruise/bleed easily.  Psychiatric/Behavioral: Negative for decreased concentration and dysphoric mood. The patient is not nervous/anxious.        Objective:   Physical Exam  Constitutional:  She is oriented to person, place, and time. She appears well-developed and well-nourished. No distress.  Well appearing   HENT:  Head: Normocephalic and atraumatic.  Right Ear: External ear normal.  Left Ear: External ear normal.  Nose: Nose normal.  Mouth/Throat: Oropharynx is clear and moist. No oropharyngeal exudate.  No sinus tenderness No temporal tenderness  No TMJ tenderness  Eyes: Pupils are equal, round, and reactive to light. Conjunctivae and EOM are normal. Right eye exhibits no discharge. Left eye exhibits no discharge. No scleral  icterus.  No nystagmus  Neck: Normal range of motion and full passive range of motion without pain. Neck supple. No JVD present. Carotid bruit is not present. No tracheal deviation present. No thyromegaly present.  Cardiovascular: Normal rate, regular rhythm and intact distal pulses. Exam reveals no gallop.  Murmur heard. Pulmonary/Chest: Effort normal and breath sounds normal. No respiratory distress. She has no wheezes. She has no rales.  No crackles  Diffusely distant bs  Wheeze on forced exp only  Mild upper airway sounds   Abdominal: Soft. Bowel sounds are normal. She exhibits no distension, no abdominal bruit and no mass. There is no tenderness.  Musculoskeletal: She exhibits no edema or tenderness.  Lymphadenopathy:    She has no cervical adenopathy.  Neurological: She is alert and oriented to person, place, and time. She has normal strength and normal reflexes. She displays no atrophy, no tremor and normal reflexes. No cranial nerve deficit or sensory deficit. She exhibits normal muscle tone. She displays a negative Romberg sign. Coordination and gait normal.  No focal cerebellar signs   Skin: Skin is warm and dry. Capillary refill takes less than 2 seconds. No rash noted. No pallor.  Psychiatric: She has a normal mood and affect. Her behavior is normal. Thought content normal.          Assessment & Plan:   Problem List Items Addressed This Visit      Cardiovascular and Mediastinum   Carotid artery disease (HCC) (Chronic)    S/p stent in Jan  Doubt adding to dizziness         Respiratory   Allergic rhinitis    Enc flonase every day  In case of ETD- poss vertigo  Update if not improved       COPD with chronic bronchitis ([place])    pulm f/u soon Is sob on exertion  Continues to smoke ? If hypoxia adds to her dizzy spells Using 02 at night       Relevant Medications   fluticasone (FLONASE) 50 MCG/ACT nasal spray     Endocrine   Type 2 diabetes mellitus with  diabetic polyneuropathy, with long-term current use of insulin (HCC)    Continue endo f/u  No longer having hypoglycemia         Other   B12 deficiency    Having spells of weakness/fatigue Check level today      Relevant Orders   Vitamin B12 (Completed)   Dizzy spells - Primary    Suspect multifactorial in pt with vasc dz and hx of CVA  Rev vasc hx and last CT scan No doubt small vessel dz (on plavix)  Reassuring exam/neuro  No recent seizures  Allergies and ETD could add to vertigo type symptoms- will start back on flonase  Labs today- hx of anemia  Will check tsh with endocrinologist and keep eye on blood glucose  No doubt seizure med will sedate- but no seizures  Consider PT for weakness  and balance after labs return       Relevant Orders   CBC with Differential/Platelet (Completed)   Comprehensive metabolic panel (Completed)   Hyponatremia    Lab today -on lasix and hctz       Relevant Orders   Comprehensive metabolic panel (Completed)   Seizures ([place])    None lately  Will f/u with neuro as planned and mention dizzy spells CT reviewed       TOBACCO USE    Disc in detail risks of smoking and possible outcomes including copd, vascular/ heart disease, cancer , respiratory and sinus infections  Pt voices understanding She is considering patch and also e cig        Other Visit Diagnoses    Chronic combined systolic and diastolic CHF (congestive heart failure) (HCC)   (Chronic)

## 2018-03-03 NOTE — Assessment & Plan Note (Signed)
Disc in detail risks of smoking and possible outcomes including copd, vascular/ heart disease, cancer , respiratory and sinus infections  Pt voices understanding She is considering patch and also e cig

## 2018-03-05 ENCOUNTER — Telehealth: Payer: Self-pay | Admitting: Family Medicine

## 2018-03-05 NOTE — Telephone Encounter (Signed)
Copied from [place] [id number]. Topic: Quick Communication - Lab Results >> Mar 05, 2018 10:41 AM Tammi Sou, CMA wrote: Called patient to inform them of Dr. Marliss Coots comments on lab results. When patient returns call, triage nurse may disclose Dr. Marliss Coots comments on labs.  Please contact pt.

## 2018-03-05 NOTE — Telephone Encounter (Signed)
See result notes. 

## 2018-03-11 NOTE — Telephone Encounter (Signed)
See result notes. 

## 2018-03-11 NOTE — Telephone Encounter (Signed)
Pt calling to check status on if Dr. Glori Bickers wanted her to do anything different regarding her labs.

## 2018-03-13 ENCOUNTER — Other Ambulatory Visit: Payer: Self-pay | Admitting: Neurology

## 2018-04-07 ENCOUNTER — Telehealth: Payer: Self-pay | Admitting: Family Medicine

## 2018-04-07 DIAGNOSIS — E049 Nontoxic goiter, unspecified: Secondary | ICD-10-CM

## 2018-04-07 DIAGNOSIS — E7800 Pure hypercholesterolemia, unspecified: Secondary | ICD-10-CM

## 2018-04-07 DIAGNOSIS — I10 Essential (primary) hypertension: Secondary | ICD-10-CM

## 2018-04-07 DIAGNOSIS — E538 Deficiency of other specified B group vitamins: Secondary | ICD-10-CM

## 2018-04-07 NOTE — Telephone Encounter (Signed)
-----   Message from Eustace Pen, LPN sent at 0/06/8118  1:44 PM EDT ----- Regarding: Labs 7/15 Lab orders needed. Thank you.  Insurance:  [employer]

## 2018-04-10 ENCOUNTER — Inpatient Hospital Stay (HOSPITAL_COMMUNITY): Admission: RE | Admit: 2018-04-10 | Payer: Medicare Other | Source: Ambulatory Visit

## 2018-04-11 ENCOUNTER — Other Ambulatory Visit: Payer: Self-pay | Admitting: Neurology

## 2018-04-14 ENCOUNTER — Ambulatory Visit (INDEPENDENT_AMBULATORY_CARE_PROVIDER_SITE_OTHER): Payer: Medicare Other

## 2018-04-14 ENCOUNTER — Ambulatory Visit: Payer: Self-pay

## 2018-04-14 VITALS — BP 110/62 | HR 72 | Temp 97.9°F | Ht <= 58 in | Wt 140.0 lb

## 2018-04-14 DIAGNOSIS — E049 Nontoxic goiter, unspecified: Secondary | ICD-10-CM

## 2018-04-14 DIAGNOSIS — E7800 Pure hypercholesterolemia, unspecified: Secondary | ICD-10-CM | POA: Diagnosis not present

## 2018-04-14 DIAGNOSIS — Z0000 Encounter for general adult medical examination without abnormal findings: Secondary | ICD-10-CM | POA: Diagnosis not present

## 2018-04-14 DIAGNOSIS — E538 Deficiency of other specified B group vitamins: Secondary | ICD-10-CM | POA: Diagnosis not present

## 2018-04-14 DIAGNOSIS — I10 Essential (primary) hypertension: Secondary | ICD-10-CM | POA: Diagnosis not present

## 2018-04-14 LAB — LIPID PANEL
Cholesterol: 146 mg/dL (ref 0–200)
HDL: 44.9 mg/dL (ref >39.00)
LDL Cholesterol: 71 mg/dL (ref 0–99)
NonHDL: 101.13
Total CHOL/HDL Ratio: 3
Triglycerides: 149 mg/dL (ref 0.0–149.0)
VLDL: 29.8 mg/dL (ref 0.0–40.0)

## 2018-04-14 LAB — TSH: TSH: 1.64 u[IU]/mL (ref 0.35–4.50)

## 2018-04-14 MED ORDER — CYANOCOBALAMIN 1000 MCG/ML IJ SOLN
1000.0000 ug | Freq: Once | INTRAMUSCULAR | Status: AC
  Administered 2018-04-14: 1000 ug via INTRAMUSCULAR

## 2018-04-14 NOTE — Patient Instructions (Signed)
Diane [name] , Thank you for taking time to come for your Medicare Wellness Visit. I appreciate your ongoing commitment to your health goals. Please review the following plan we discussed and let me know if I can assist you in the future.   These are the goals we discussed: Goals    . Increase physical activity     Starting 04/14/2018, I will continue to exercise in pool 1-3 hours 2 days per week.        This is a list of the screening recommended for you and due dates:  Health Maintenance  Topic Date Due  . Complete foot exam   04/21/2018*  . Eye exam for diabetics  09/30/2018*  . Mammogram  05/01/2019*  . HIV Screening  11/03/2023*  . Pap Smear  04/14/2049*  . Flu Shot  05/01/2018  . Hemoglobin A1C  06/01/2018  . Tetanus Vaccine  11/16/2019  . Colon Cancer Screening  07/17/2027  .  Hepatitis C: One time screening is recommended by Center for Disease Control  (CDC) for  adults born from [birth year] through 1967.   Completed  *Topic was postponed. The date shown is not the original due date.   Preventive Care for Adults  A healthy lifestyle and preventive care can promote health and wellness. Preventive health guidelines for adults include the following key practices.  . A routine yearly physical is a good way to check with your health care provider about your health and preventive screening. It is a chance to share any concerns and updates on your health and to receive a thorough exam.  . Visit your dentist for a routine exam and preventive care every 6 months. Brush your teeth twice a day and floss once a day. Good oral hygiene prevents tooth decay and gum disease.  . The frequency of eye exams is based on your age, health, family medical history, use  of contact lenses, and other factors. Follow your health care provider's recommendations for frequency of eye exams.  . Eat a healthy diet. Foods like vegetables, fruits, whole grains, low-fat dairy products, and lean protein foods  contain the nutrients you need without too many calories. Decrease your intake of foods high in solid fats, added sugars, and salt. Eat the right amount of calories for you. Get information about a proper diet from your health care provider, if necessary.  . Regular physical exercise is one of the most important things you can do for your health. Most adults should get at least 150 minutes of moderate-intensity exercise (any activity that increases your heart rate and causes you to sweat) each week. In addition, most adults need muscle-strengthening exercises on 2 or more days a week.  Silver Sneakers may be a benefit available to you. To determine eligibility, you may visit the website: www.silversneakers.com or contact program at 636-776-4515 Mon-Fri between 8AM-8PM.   . Maintain a healthy weight. The body mass index (BMI) is a screening tool to identify possible weight problems. It provides an estimate of body fat based on height and weight. Your health care provider can find your BMI and can help you achieve or maintain a healthy weight.   For adults 20 years and older: ? A BMI below 18.5 is considered underweight. ? A BMI of 18.5 to 24.9 is normal. ? A BMI of 25 to 29.9 is considered overweight. ? A BMI of 30 and above is considered obese.   . Maintain normal blood lipids and cholesterol levels by exercising and  minimizing your intake of saturated fat. Eat a balanced diet with plenty of fruit and vegetables. Blood tests for lipids and cholesterol should begin at age 74 and be repeated every 5 years. If your lipid or cholesterol levels are high, you are over 50, or you are at high risk for heart disease, you may need your cholesterol levels checked more frequently. Ongoing high lipid and cholesterol levels should be treated with medicines if diet and exercise are not working.  . If you smoke, find out from your health care provider how to quit. If you do not use tobacco, please do not  start.  . If you choose to drink alcohol, please do not consume more than 2 drinks per day. One drink is considered to be 12 ounces (355 mL) of beer, 5 ounces (148 mL) of wine, or 1.5 ounces (44 mL) of liquor.  . If you are 52-79 years old, ask your health care provider if you should take aspirin to prevent strokes.  . Use sunscreen. Apply sunscreen liberally and repeatedly throughout the day. You should seek shade when your shadow is shorter than you. Protect yourself by wearing long sleeves, pants, a wide-brimmed hat, and sunglasses year round, whenever you are outdoors.  . Once a month, do a whole body skin exam, using a mirror to look at the skin on your back. Tell your health care provider of new moles, moles that have irregular borders, moles that are larger than a pencil eraser, or moles that have changed in shape or color.

## 2018-04-14 NOTE — Progress Notes (Signed)
Subjective:   Diane [name] is a 62 y.o. female who presents for Medicare Annual (Subsequent) preventive examination.  Review of Systems:  N/A Cardiac Risk Factors include: advanced age (>64mn, >>69women);dyslipidemia;hypertension;diabetes mellitus     Objective:     Vitals: BP 110/62 (BP Location: Right Arm, Patient Position: Sitting, Cuff Size: Normal)   Pulse 72   Temp 97.9 F (36.6 C) (Oral)   Ht 4' 9.5" (1.461 m) Comment: no shoes  Wt 140 lb (63.5 kg)   LMP 09/02/2003   SpO2 97%   BMI 29.77 kg/m   Body mass index is 29.77 kg/m.  Advanced Directives 04/14/2018 10/10/2017 07/16/2017 07/08/2017 04/11/2017 01/14/2017 05/15/2016  Does Patient Have a Medical Advance Directive? Yes Yes No Yes Yes Yes Yes  Type of Advance Directive Healthcare [place] of [place] Does patient want to make changes to medical advance directive? - No - Patient declined - - - - No - Patient declined  Copy of [place]? No - copy requested No - copy requested - No - copy requested No - copy requested No - copy requested No - copy requested  Would patient like information on creating a medical advance directive? No - Patient declined - No - Patient declined - - - -  Pre-existing out of facility DNR order (yellow form or pink MOST form) - - - - - - -    Tobacco Social History   Tobacco Use  Smoking Status Current Every Day Smoker  . Packs/day: 1.50  . Years: 50.00  . Pack years: 75.00  . Types: Cigarettes  Smokeless Tobacco Never Used  Tobacco Comment   1.5 pack to 2 packs per day     Ready to quit: No Counseling given: No Comment: 1.5 pack to 2 packs per day   Clinical Intake:  Pre-visit preparation completed: Yes  Pain Score: 5      Nutritional Status: BMI > 30  Obese Nutritional Risks:  None Diabetes: No  How often do you need to have someone help you when you read instructions, pamphlets, or other written materials from your doctor or pharmacy?: 1 - Never What is the last grade level you completed in school?: 12th grade + 3 yrs college  Interpreter Needed?: No  Information entered by :: LPinson, LPN  Past Medical History:  Diagnosis Date  . Anemia   . Carotid stenosis    a. s/p right carotid stent 10/2017.  .Marland KitchenChronic systolic CHF (congestive heart failure) ([place]   . Colon polyps   . COPD (chronic obstructive pulmonary disease) ([place]   . Coronary artery disease    post CABG in 3/07 , coronary stents   . Diabetes mellitus    type 2  . Dyslipidemia   . Fibromyalgia   . GERD (gastroesophageal reflux disease)   . Headache    hx of   . Hyperlipidemia   . Hypertension   . Myocardial infarction ([place]   . Pneumonia    hx of   . Seizures ([place]    last seizure- 03/2013   . Shortness of breath dyspnea    with exertion or when fluid builds up   . Sleep apnea    used to wear a cpap- not used in 3 years   . Status post dilation of esophageal narrowing   . Stroke (  [place]) 1993   problems with balance   . Systolic murmur    known mild AS and MR  . Thyroid goiter   . Tobacco abuse    Past Surgical History:  Procedure Laterality Date  . APPENDECTOMY    . CARDIAC CATHETERIZATION  11/29/05   EF of 55%  . CARDIAC CATHETERIZATION  08/06/06   EF of 45-50%  . CARDIAC CATHETERIZATION N/A 05/06/2015   Procedure: Right/Left Heart Cath and Coronary/Graft Angiography;  Surgeon: Peter M Martinique, MD;  Location: [facility];  Service: Cardiovascular;  Laterality: N/A;  . CAROTID PTA/STENT INTERVENTION N/A 10/10/2017   Procedure: CAROTID PTA/STENT INTERVENTION - Right;  Surgeon: Serafina Mitchell, MD;  Location: [facility];  Service: Cardiovascular;  Laterality: N/A;  . CERVICAL FUSION  1990  . CHOLECYSTECTOMY    . COLON RESECTION     mass removed and 4 in of colon  .  COLONOSCOPY WITH PROPOFOL N/A 07/16/2017   Procedure: COLONOSCOPY WITH PROPOFOL;  Surgeon: Ladene Artist, MD;  Location: [facility];  Service: Endoscopy;  Laterality: N/A;  . CORONARY ARTERY BYPASS GRAFT  12/04/2005   x5 -- left internal mammary artery to the LAD, left radial artery to the ramus intermedius, saphenous vein graft to the obtuse marginal 1, sequential saphenous vein grat to the acute marginal and posterior descending, endoscopic vein harvesting from the left thigh with open vein harvest from right leg  . CORONARY STENT PLACEMENT  08/11/06   PCI of her ciurcumflex/OM vessel  . LAPAROTOMY Bilateral 05/19/2015   Procedure: EXPLORATORY LAPAROTOMY WITH BILATERAL SALPINGO OOPHORECTOMY /OMENTECTOMY/SEGMENTAL SIGMOID COLECTOMY ;  Surgeon: Everitt Amber, MD;  Location: [facility];  Service: Gynecology;  Laterality: Bilateral;  . PERIPHERAL VASCULAR CATHETERIZATION N/A 11/15/2015   Procedure: Carotid PTA/Stent Intervention;  Surgeon: Lorretta Harp, MD;  Location: [facility];  Service: Cardiovascular;  Laterality: N/A;  . PERIPHERAL VASCULAR CATHETERIZATION  11/15/2015   Procedure: Carotid Angiography;  Surgeon: Lorretta Harp, MD;  Location: [facility];  Service: Cardiovascular;;   Family History  Problem Relation Age of Onset  . Heart disease Mother   . Diabetes Mother   . COPD Mother   . Hyperlipidemia Mother   . Hypertension Mother   . Cancer Father        met, origin unknown  . Heart attack Father   . Drug abuse Paternal Grandmother   . Stroke Paternal Grandfather   . Lung cancer Paternal Aunt        lung with mets to brain  . Melanoma Paternal Uncle   . Lung cancer Paternal Uncle        lung/liver to brain  . Cancer Paternal Uncle        cancer of unknown type  . Heart disease Maternal Grandfather   . Hypertension Sister   . Cancer Sister        eyelid  . Glaucoma Sister   . Heart disease Maternal Aunt        x 2 aunts   Social History   Socioeconomic  History  . Marital status: Divorced    Spouse name: Not on file  . Number of children: 0  . Years of education: Not on file  . Highest education level: Not on file  Occupational History  . Occupation: disabled  Social Needs  . Financial resource strain: Not on file  . Food insecurity:    Worry: Not on file    Inability: Not  on file  . Transportation needs:    Medical: Not on file    Non-medical: Not on file  Tobacco Use  . Smoking status: Current Every Day Smoker    Packs/day: 1.50    Years: 50.00    Pack years: 75.00    Types: Cigarettes  . Smokeless tobacco: Never Used  . Tobacco comment: 1.5 pack to 2 packs per day  Substance and Sexual Activity  . Alcohol use: No    Alcohol/week: 0.6 oz    Types: 1 Standard drinks or equivalent per week  . Drug use: No  . Sexual activity: Not Currently    Partners: Male    Birth control/protection: None  Lifestyle  . Physical activity:    Days per week: Not on file    Minutes per session: Not on file  . Stress: Not on file  Relationships  . Social connections:    Talks on phone: Not on file    Gets together: Not on file    Attends religious service: Not on file    Active member of club or organization: Not on file    Attends meetings of clubs or organizations: Not on file    Relationship status: Not on file  Other Topics Concern  . Not on file  Social History Narrative  . Not on file    Outpatient Encounter Medications as of 04/14/2018  Medication Sig  . acetaminophen (TYLENOL) 500 MG tablet Take 1,000 mg by mouth every 6 (six) hours as needed for moderate pain or headache.  . albuterol (VENTOLIN HFA) 108 (90 Base) MCG/ACT inhaler INHALER 2 PUFFS EVERY 6 HOURS AS NEEDED FOR WHEEZING/SHORTNESS OF BREATH  . aspirin EC 81 MG tablet Take 81 mg by mouth every evening.   . carvedilol (COREG) 25 MG tablet TAKE 1 TABLET (25 MG TOTAL) BY MOUTH 2 (TWO) TIMES DAILY WITH A MEAL.  . cetirizine (ZYRTEC) 10 MG tablet Take 10 mg by mouth  daily as needed for allergies.  [name] clopidogrel (PLAVIX) 75 MG tablet TAKE 1 TABLET BY MOUTH EVERY DAY  . dexlansoprazole (DEXILANT) 60 MG capsule TAKE 1 CAPSULE (60 MG TOTAL) BY MOUTH DAILY. (Patient taking differently: TAKE 1 CAPSULE (60 MG TOTAL) BY MOUTH DAILY IN THE EVENING)  . diphenhydrAMINE (BENADRYL) 25 mg capsule Take 2 capsules in AM and 1 capsule at night (Patient taking differently: Take 50 mg by mouth 2 (two) times daily as needed for itching or allergies. )  . fluticasone (FLONASE) 50 MCG/ACT nasal spray Place 2 sprays into both nostrils daily.  . furosemide (LASIX) 40 MG tablet TAKE 1 TABLET BY MOUTH EVERY DAY  . gabapentin (NEURONTIN) 300 MG capsule Take 2 capsules (600 mg total) by mouth at bedtime.  [name] glimepiride (AMARYL) 2 MG tablet Take 2 mg by mouth 2 (two) times daily.  [name] guaiFENesin (MUCINEX) 600 MG 12 hr tablet Take 2 tablets (1,200 mg total) by mouth 2 (two) times daily.  . hydrochlorothiazide (HYDRODIURIL) 50 MG tablet Take 25 mg by mouth daily.  . isosorbide mononitrate (IMDUR) 60 MG 24 hr tablet Take 1 tablet (60 mg total) by mouth daily.  [name] KLOR-CON M10 10 MEQ tablet TAKE 1 TABLET BY MOUTH TWICE A DAY  . levETIRAcetam (KEPPRA) 1000 MG tablet TAKE 1 TABLET BY MOUTH TWICE A DAY  . losartan (COZAAR) 100 MG tablet TAKE 1 TABLET (100 MG TOTAL) BY MOUTH DAILY.  . metFORMIN (GLUCOPHAGE-XR) 500 MG 24 hr tablet Take 500 mg by mouth 2 (two)  times daily.  . nitroGLYCERIN (NITROLINGUAL) 0.4 MG/SPRAY spray PLACE 1 SPRAY UNDER THE TONGUE EVERY 5 (FIVE) MINUTES X 3 DOSES AS NEEDED FOR CHEST PAIN.  . ONE TOUCH ULTRA TEST test strip USE AS DIRECTED FOR TESTING BLOOD GLUCOSE 3 TIMES DAILY  . oxyCODONE (OXY IR/ROXICODONE) 5 MG immediate release tablet Take 1-2 tablets (5-10 mg total) by mouth every 4 (four) hours as needed for moderate pain.  . polyethylene glycol powder (GLYCOLAX/MIRALAX) powder TAKE 1 DOSE 17 GRAMS IN 8 OZ OF WATER DAILY AS NEEDED FOR CONSTIPATION.  . RANEXA 1000 MG SR  tablet TAKE 1 TABLET BY MOUTH TWICE A DAY  . rosuvastatin (CRESTOR) 10 MG tablet TAKE 1 TABLET BY MOUTH DAILY  . sodium chloride (OCEAN) 0.65 % SOLN nasal spray Place 1 spray into both nostrils as needed for congestion.  . [DISCONTINUED] BEVESPI AEROSPHERE 9-4.8 MCG/ACT AERO TAKE 2 PUFFS BY MOUTH TWICE A DAY  . [EXPIRED] cyanocobalamin ((VITAMIN B-12)) injection 1,000 mcg    No facility-administered encounter medications on file as of 04/14/2018.     Activities of Daily Living In your present state of health, do you have any difficulty performing the following activities: 04/14/2018 10/11/2017  Hearing? N N  Vision? N N  Difficulty concentrating or making decisions? N N  Walking or climbing stairs? Y Y  Dressing or bathing? N Y  Doing errands, shopping? N N  Preparing Food and eating ? N -  Using the Toilet? N -  In the past six months, have you accidently leaked urine? N -  Do you have problems with loss of bowel control? N -  Managing your Medications? N -  Managing your Finances? N -  Housekeeping or managing your Housekeeping? N -  Some recent data might be hidden    Patient Care Team: Tower, [name], MD as PCP - General Ralene Ok, MD as Consulting Physician (General Surgery) Everitt Amber, MD as Consulting Physician (Obstetrics and Gynecology) [name], MD as Consulting Physician (Pulmonary Disease) Leo Grosser, Seymour Bars, MD as Consulting Physician (Obstetrics and Gynecology) Jacelyn Pi, MD as Consulting Physician (Endocrinology) Marica Otter, OD (Optometry) Martinique, Peter M, MD as Consulting Physician (Cardiology) Lorretta Harp, MD as Consulting Physician (Cardiology) Bo Merino, MD as Consulting Physician (Rheumatology)    Assessment:   This is a routine wellness examination for Diane [name].   Hearing Screening   '125Hz'  '250Hz'  '500Hz'  '1000Hz'  '2000Hz'  '3000Hz'  '4000Hz'  '6000Hz'  '8000Hz'   Right ear:   40 40 40  40    Left ear:   40 40 40  40      Visual Acuity  Screening   Right eye Left eye Both eyes  Without correction:     With correction: '20/25 20/25 20/25 '     Exercise Activities and Dietary recommendations Current Exercise Habits: Home exercise routine, Type of exercise: Other - see comments(pool ), Time (Minutes): 30, Frequency (Times/Week): 2, Weekly Exercise (Minutes/Week): 60, Intensity: Moderate, Exercise limited by: None identified  Goals    . Increase physical activity     Starting 04/14/2018, I will continue to exercise in pool 1-3 hours 2 days per week.        Fall Risk Fall Risk  04/14/2018 10/28/2017 04/29/2017 04/11/2017 12/26/2016  Falls in the past year? Yes No No No Yes  Comment slipped on wet floor in restaurant; fractured left wrist and injured right hip - - - -  Number falls in past yr: 1 - - - 1  Injury with Fall? Yes - - -  Yes  Risk Factor Category  - - - - -  Risk for fall due to : - - - - -  Risk for fall due to: Comment - - - - -  Follow up - - - - -    Depression Screen PHQ 2/9 Scores 04/14/2018 04/11/2017 06/21/2015 05/23/2015  PHQ - 2 Score 0 0 0 0  PHQ- 9 Score 0 - - -     Cognitive Function MMSE - Mini Mental State Exam 04/14/2018 04/11/2017  Orientation to time 5 5  Orientation to Place 5 5  Registration 3 3  Attention/ Calculation 0 0  Recall 3 3  Language- name 2 objects 0 0  Language- repeat 1 1  Language- follow 3 step command 3 3  Language- read & follow direction 0 0  Write a sentence 0 0  Copy design 0 0  Total score 20 20     PLEASE NOTE: A Mini-Cog screen was completed. Maximum score is 20. A value of 0 denotes this part of [place] MMSE was not completed or the patient failed this part of the Mini-Cog screening.   Mini-Cog Screening Orientation to Time - Max 5 pts Orientation to Place - Max 5 pts Registration - Max 3 pts Recall - Max 3 pts Language Repeat - Max 1 pts Language Follow 3 Step Command - Max 3 pts     Immunization History  Administered Date(s) Administered  .  Influenza Whole 06/01/2012  . Influenza,inj,Quad PF,6+ Mos 06/16/2013, 06/16/2014, 06/21/2015, 08/13/2016, 10/11/2017  . Pneumococcal Conjugate-13 05/11/2016  . Pneumococcal Polysaccharide-23 11/18/1998, 11/04/2008, 06/16/2014  . Td 10/01/1996, 11/15/2009   Screening Tests Health Maintenance  Topic Date Due  . FOOT EXAM  04/21/2018 (Originally 08/13/2017)  . OPHTHALMOLOGY EXAM  09/30/2018 (Originally 02/17/2018)  . MAMMOGRAM  05/01/2019 (Originally 05/16/2016)  . HIV Screening  11/03/2023 (Originally 03/10/1971)  . PAP SMEAR  04/14/2049 (Originally 10/02/2016)  . INFLUENZA VACCINE  05/01/2018  . HEMOGLOBIN A1C  06/01/2018  . TETANUS/TDAP  11/16/2019  . COLONOSCOPY  07/17/2027  . Hepatitis C Screening  Completed     Plan:     I have personally reviewed, addressed, and noted the following in the patient's chart:  A. Medical and social history B. Use of alcohol, tobacco or illicit drugs  C. Current medications and supplements D. Functional ability and status E.  Nutritional status F.  Physical activity G. Advance directives H. List of other physicians I.  Hospitalizations, surgeries, and ER visits in previous 12 months J.  [place] to include hearing, vision, cognitive, depression L. Referrals and appointments - none  In addition, I have reviewed and discussed with patient certain preventive protocols, quality metrics, and best practice recommendations. A written personalized care plan for preventive services as well as general preventive health recommendations were provided to patient.  See attached scanned questionnaire for additional information.   Signed,   Lindell Noe, MHA, BS, LPN Health Coach

## 2018-04-14 NOTE — Progress Notes (Signed)
PCP notes:   Health maintenance:  Foot exam - PCP please address at next appt Eye exam - addressed Mammogram - addressed PAP smear - pt declined  Abnormal screenings:   Fall risk - hx of single fall Fall Risk  04/14/2018 10/28/2017 04/29/2017 04/11/2017 12/26/2016  Falls in the past year? Yes No No No Yes  Comment slipped on wet floor in restaurant; fractured left wrist and injured right hip - - - -  Number falls in past yr: 1 - - - 1  Injury with Fall? Yes - - - Yes  Risk Factor Category  - - - - -  Risk for fall due to : - - - - -  Risk for fall due to: Comment - - - - -  Follow up - - - - -    Patient concerns:   Patient expressed concerns with intermittent right hip pain related to fall.  Nurse concerns:  None  Next PCP appt:   04/21/18 @ 1430  I reviewed health advisor's note, was available for consultation, and agree with documentation and plan. Loura Pardon MD

## 2018-04-15 DIAGNOSIS — S62115D Nondisplaced fracture of triquetrum [cuneiform] bone, left wrist, subsequent encounter for fracture with routine healing: Secondary | ICD-10-CM | POA: Diagnosis not present

## 2018-04-16 ENCOUNTER — Ambulatory Visit (HOSPITAL_COMMUNITY)
Admission: RE | Admit: 2018-04-16 | Discharge: 2018-04-16 | Disposition: A | Discharge status: Home / Self Care | Payer: Medicare Other | Source: Ambulatory Visit | Attending: Cardiovascular Disease | Admitting: Cardiovascular Disease

## 2018-04-16 DIAGNOSIS — I6521 Occlusion and stenosis of right carotid artery: Secondary | ICD-10-CM | POA: Diagnosis not present

## 2018-04-21 ENCOUNTER — Encounter: Payer: Self-pay | Admitting: Family Medicine

## 2018-04-21 ENCOUNTER — Ambulatory Visit (INDEPENDENT_AMBULATORY_CARE_PROVIDER_SITE_OTHER): Payer: Medicare Other | Admitting: Family Medicine

## 2018-04-21 VITALS — BP 100/58 | HR 83 | Temp 97.7°F | Ht <= 58 in | Wt 139.0 lb

## 2018-04-21 DIAGNOSIS — F17200 Nicotine dependence, unspecified, uncomplicated: Secondary | ICD-10-CM | POA: Diagnosis not present

## 2018-04-21 DIAGNOSIS — J9611 Chronic respiratory failure with hypoxia: Secondary | ICD-10-CM | POA: Diagnosis not present

## 2018-04-21 DIAGNOSIS — E2839 Other primary ovarian failure: Secondary | ICD-10-CM | POA: Diagnosis not present

## 2018-04-21 DIAGNOSIS — E538 Deficiency of other specified B group vitamins: Secondary | ICD-10-CM | POA: Diagnosis not present

## 2018-04-21 DIAGNOSIS — E118 Type 2 diabetes mellitus with unspecified complications: Secondary | ICD-10-CM | POA: Diagnosis not present

## 2018-04-21 DIAGNOSIS — E1142 Type 2 diabetes mellitus with diabetic polyneuropathy: Secondary | ICD-10-CM | POA: Diagnosis not present

## 2018-04-21 DIAGNOSIS — J410 Simple chronic bronchitis: Secondary | ICD-10-CM

## 2018-04-21 DIAGNOSIS — I10 Essential (primary) hypertension: Secondary | ICD-10-CM | POA: Diagnosis not present

## 2018-04-21 DIAGNOSIS — E7800 Pure hypercholesterolemia, unspecified: Secondary | ICD-10-CM | POA: Diagnosis not present

## 2018-04-21 DIAGNOSIS — Z1231 Encounter for screening mammogram for malignant neoplasm of breast: Secondary | ICD-10-CM | POA: Diagnosis not present

## 2018-04-21 DIAGNOSIS — M8580 Other specified disorders of bone density and structure, unspecified site: Secondary | ICD-10-CM | POA: Insufficient documentation

## 2018-04-21 DIAGNOSIS — M8589 Other specified disorders of bone density and structure, multiple sites: Secondary | ICD-10-CM

## 2018-04-21 DIAGNOSIS — Z8619 Personal history of other infectious and parasitic diseases: Secondary | ICD-10-CM

## 2018-04-21 DIAGNOSIS — E049 Nontoxic goiter, unspecified: Secondary | ICD-10-CM | POA: Diagnosis not present

## 2018-04-21 DIAGNOSIS — Z794 Long term (current) use of insulin: Secondary | ICD-10-CM

## 2018-04-21 DIAGNOSIS — I255 Ischemic cardiomyopathy: Secondary | ICD-10-CM

## 2018-04-21 MED ORDER — FLUTICASONE PROPIONATE 50 MCG/ACT NA SUSP: 2.0000 | Freq: Every day | NASAL | 11 refills | Status: DC

## 2018-04-21 MED ORDER — DEXLANSOPRAZOLE 60 MG PO CPDR: DELAYED_RELEASE_CAPSULE | ORAL | 11 refills | Status: DC

## 2018-04-21 NOTE — Assessment & Plan Note (Signed)
Per pt watched by endocrinology

## 2018-04-21 NOTE — Assessment & Plan Note (Signed)
Lab Results  Component Value Date   VITAMINB12 249 03/03/2018   Had a B12 shot  Now taking 1000 mcg of B12 daily orally  Will re check in 2-3 mo

## 2018-04-21 NOTE — Assessment & Plan Note (Signed)
No change Pt states she cannot stop smoking

## 2018-04-21 NOTE — Assessment & Plan Note (Signed)
Continues to see endocrinology  Unsure of last A1C She will make her own eye exam Disc foot care

## 2018-04-21 NOTE — Assessment & Plan Note (Signed)
Disc goals for lipids and reasons to control them Rev last labs with pt Rev low sat fat diet in detail Controlled with crestor and diet  Trig are down

## 2018-04-21 NOTE — Assessment & Plan Note (Signed)
bp in fair control at this time  BP Readings from Last 1 Encounters:  04/21/18 (!) 100/58   No changes needed Most recent labs reviewed  Disc lifstyle change with low sodium diet and exercise

## 2018-04-21 NOTE — Progress Notes (Signed)
Subjective:    Patient ID: Diane [name], female    DOB: [birth year], 62 y.o.   MRN: [id number]  HPI Here for annual f/u of chronic medical problem   Wt Readings from Last 3 Encounters:  04/21/18 139 lb (63 kg)  04/14/18 140 lb (63.5 kg)  03/03/18 140 lb 12 oz (63.8 kg)   29.56 kg/m   Had amw on 7/15 Noted fall this year- slipped on wet floor at restaurant and fx L wrist/inj hip She is careful not to fall again  No more falls  Balance is fair - uses a walker when needed   Sees Dr Mardelle Matte for her hip on Friday (R hip pain)  She still gets episodes of unexpected weakness  She wonders if she gets dehydrated  Also B12 level is low   Smoking status -still smoked 1.5 to 2ppd No change  Has not been able to quit - many trials/programs /methods Low hope for quitting  Smokers cough continues   Eye exam - was 2 y ago -she needs to schedule it and will do herself Focus is a little less  Slow growing cataracts   Mammogram 8/16-- decided not to continue mammograms for now  Wants to get that scheduled  Self breast exam -no lumps   Pap 12/13 with gyn was neg with pos HPV test (but not type 16 or 18)  Used to see Dr Leo Grosser  No longer goes to gyn -declines further pap tests    Colonoscopy 10/18 nl with 10 y recall  Hx of partial colectomy for non cancerous mass in the past   Zoster status -has not been immunized   dexa 10/14 osteopenia Smoker Small frame  Followed by gyn Did break her wrist  She is interested in another dexa    bp is lower than usual today- feeling ok  No cp or palpitations or headaches or edema  No side effects to medicines  BP Readings from Last 3 Encounters:  04/21/18 (!) 100/58  04/14/18 110/62  03/03/18 136/74    Cardiac status  Had RCA stent 1/19 by Dr Curly Rim again last week/no change  Cardiomyopathy is stable   Copd/chronic resp failure  Some days are better than others Heat makes it worse  Smokers cough - worse after traveling    DM2 Sees Dr Chalmers Cater Sees her again in sept  Not opt controlled (since then she has had steroids)    B12 def Lab Results  Component Value Date   VITAMINB12 249 03/03/2018  she got the shot last week  Now taking B12 every day  Will check in 2-3 mo     Hyperlipidemia Lab Results  Component Value Date   CHOL 146 04/14/2018   CHOL 153 04/11/2017   CHOL 179 08/13/2016   Lab Results  Component Value Date   HDL 44.90 04/14/2018   HDL 47.30 04/11/2017   HDL 65.20 08/13/2016   Lab Results  Component Value Date   LDLCALC 71 04/14/2018   LDLCALC 77 08/13/2016   LDLCALC 43 06/21/2015   Lab Results  Component Value Date   TRIG 149.0 04/14/2018   TRIG 231.0 (H) 04/11/2017   TRIG 183.0 (H) 08/13/2016   Lab Results  Component Value Date   CHOLHDL 3 04/14/2018   CHOLHDL 3 04/11/2017   CHOLHDL 3 08/13/2016   Lab Results  Component Value Date   LDLDIRECT 76.0 04/11/2017   LDLDIRECT 98.0 06/09/2013   LDLDIRECT 53.6 12/12/2012   crestor and diet  Trig are down   Lab Results  Component Value Date   CREATININE 0.70 03/03/2018   BUN 21 03/03/2018   NA 135 03/03/2018   K 4.7 03/03/2018   CL 98 03/03/2018   CO2 30 03/03/2018   Lab Results  Component Value Date   ALT 10 03/03/2018   AST 7 03/03/2018   ALKPHOS 60 03/03/2018   BILITOT 0.3 03/03/2018    Lab Results  Component Value Date   WBC 9.0 03/03/2018   HGB 12.6 03/03/2018   HCT 38.4 03/03/2018   MCV 78.5 03/03/2018   PLT 249.0 03/03/2018   Lab Results  Component Value Date   TSH 1.64 04/14/2018     Patient Active Problem List   Diagnosis Date Noted  . Estrogen deficiency 04/21/2018  . Screening mammogram, encounter for 04/21/2018  . Dizzy spells 03/03/2018  . Right hip pain 02/07/2018  . Ischemic cardiomyopathy 01/21/2018  . Other constipation 05/07/2017  . RLQ abdominal pain 05/07/2017  . Colon cancer screening 04/19/2017  . Incisional hernia 01/14/2017  . Smokers' cough ([place]) 08/13/2016  .  Chronic respiratory failure ([place]) 07/11/2016  . COPD with chronic bronchitis ([place]) 05/20/2016  . Sleep-related hypoventilation due to lower airway obstruction 05/20/2016  . Periodic limb movements of sleep 05/20/2016  . CHF (congestive heart failure) ([place]) 05/08/2016  . Type 2 diabetes mellitus with diabetic polyneuropathy, with long-term current use of insulin ([place]) 12/13/2015  . B12 deficiency 12/01/2015  . History of CVA (cerebrovascular accident) 11/29/2015  . Abnormal nuclear stress test 05/06/2015  . 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 of localized onset, not intractable, without status epilepticus ([place]) 03/22/2015  . Cervical disc disorder with radiculopathy of cervical region 03/22/2015  . Complex partial seizures ([place]) 09/16/2013  . Encounter for Medicare annual wellness exam 06/16/2013  . Antiplatelet or antithrombotic long-term use 06/08/2013  . Seizures ([place]) 03/25/2013  . Goiter 09/01/2012  . Other screening mammogram 06/13/2012  . Allergic rhinitis 06/13/2012  . Carotid artery disease ([place]) 08/20/2011  . DIZZINESS 07/26/2010  . Obstructive sleep apnea 09/18/2007  . TOBACCO USE 08/08/2007  . Controlled diabetes mellitus type 2 with complications ([place]) 26/71/2458  . Hyperlipidemia 03/26/2007  . ANXIETY 03/26/2007  . DEPRESSION 03/26/2007  . MIGRAINE HEADACHE 03/26/2007  . CARPAL TUNNEL SYNDROME, BILATERAL 03/26/2007  . Essential hypertension 03/26/2007  . Hx of CABG 03/26/2007  . GERD 03/26/2007  . Claudication in peripheral vascular disease ([place]) 03/26/2007   Past Medical History:  Diagnosis Date  . Anemia   . Carotid stenosis    a. s/p right carotid stent 10/2017.  [name] Chronic systolic CHF (congestive heart failure) ([place])   . Colon polyps   . COPD (chronic obstructive pulmonary disease) ([place])   . Coronary artery disease    post CABG in 3/07 , coronary stents   . Diabetes mellitus    type 2  . Dyslipidemia   .  Fibromyalgia   . GERD (gastroesophageal reflux disease)   . Headache    hx of   . Hyperlipidemia   . Hypertension   . Myocardial infarction ([place])   . Pneumonia    hx of   . Seizures ([place])    last seizure- 03/2013   . Shortness of breath dyspnea    with exertion or when fluid builds up   . Sleep apnea    used to wear a cpap- not used in 3 years   . Status post dilation of esophageal narrowing   . Stroke (  [place]) 1993   problems with balance   . Systolic murmur    known mild AS and MR  . Thyroid goiter   . Tobacco abuse    Past Surgical History:  Procedure Laterality Date  . APPENDECTOMY    . CARDIAC CATHETERIZATION  11/29/05   EF of 55%  . CARDIAC CATHETERIZATION  08/06/06   EF of 45-50%  . CARDIAC CATHETERIZATION N/A 05/06/2015   Procedure: Right/Left Heart Cath and Coronary/Graft Angiography;  Surgeon: Peter M Martinique, MD;  Location: [facility];  Service: Cardiovascular;  Laterality: N/A;  . CAROTID PTA/STENT INTERVENTION N/A 10/10/2017   Procedure: CAROTID PTA/STENT INTERVENTION - Right;  Surgeon: Serafina Mitchell, MD;  Location: [facility];  Service: Cardiovascular;  Laterality: N/A;  . CERVICAL FUSION  1990  . CHOLECYSTECTOMY    . COLON RESECTION     mass removed and 4 in of colon  . COLONOSCOPY WITH PROPOFOL N/A 07/16/2017   Procedure: COLONOSCOPY WITH PROPOFOL;  Surgeon: Ladene Artist, MD;  Location: [facility];  Service: Endoscopy;  Laterality: N/A;  . CORONARY ARTERY BYPASS GRAFT  12/04/2005   x5 -- left internal mammary artery to the LAD, left radial artery to the ramus intermedius, saphenous vein graft to the obtuse marginal 1, sequential saphenous vein grat to the acute marginal and posterior descending, endoscopic vein harvesting from the left thigh with open vein harvest from right leg  . CORONARY STENT PLACEMENT  08/11/06   PCI of her ciurcumflex/OM vessel  . LAPAROTOMY Bilateral 05/19/2015   Procedure: EXPLORATORY LAPAROTOMY WITH BILATERAL SALPINGO  OOPHORECTOMY /OMENTECTOMY/SEGMENTAL SIGMOID COLECTOMY ;  Surgeon: Everitt Amber, MD;  Location: [facility];  Service: Gynecology;  Laterality: Bilateral;  . PERIPHERAL VASCULAR CATHETERIZATION N/A 11/15/2015   Procedure: Carotid PTA/Stent Intervention;  Surgeon: Lorretta Harp, MD;  Location: [facility];  Service: Cardiovascular;  Laterality: N/A;  . PERIPHERAL VASCULAR CATHETERIZATION  11/15/2015   Procedure: Carotid Angiography;  Surgeon: Lorretta Harp, MD;  Location: [facility];  Service: Cardiovascular;;   Social History   Tobacco Use  . Smoking status: Current Every Day Smoker    Packs/day: 1.50    Years: 50.00    Pack years: 75.00    Types: Cigarettes  . Smokeless tobacco: Never Used  . Tobacco comment: 1.5 pack to 2 packs per day  Substance Use Topics  . Alcohol use: No    Alcohol/week: 0.6 oz    Types: 1 Standard drinks or equivalent per week  . Drug use: No   Family History  Problem Relation Age of Onset  . Heart disease Mother   . Diabetes Mother   . COPD Mother   . Hyperlipidemia Mother   . Hypertension Mother   . Cancer Father        met, origin unknown  . Heart attack Father   . Drug abuse Paternal Grandmother   . Stroke Paternal Grandfather   . Lung cancer Paternal Aunt        lung with mets to brain  . Melanoma Paternal Uncle   . Lung cancer Paternal Uncle        lung/liver to brain  . Cancer Paternal Uncle        cancer of unknown type  . Heart disease Maternal Grandfather   . Hypertension Sister   . Cancer Sister        eyelid  . Glaucoma Sister   . Heart disease Maternal Aunt  x 2 aunts   Allergies  Allergen Reactions  . Bee Venom Itching and Swelling  . Entresto [Sacubitril-Valsartan] Cough  . Tetracycline     Unknown   . Ace Inhibitors Cough   Current Outpatient Medications on File Prior to Visit  Medication Sig Dispense Refill  . acetaminophen (TYLENOL) 500 MG tablet Take 1,000 mg by mouth every 6 (six) hours as needed  for moderate pain or headache.    . albuterol (VENTOLIN HFA) 108 (90 Base) MCG/ACT inhaler INHALER 2 PUFFS EVERY 6 HOURS AS NEEDED FOR WHEEZING/SHORTNESS OF BREATH 1 Inhaler 0  . aspirin EC 81 MG tablet Take 81 mg by mouth every evening.  30 tablet 6  . carvedilol (COREG) 25 MG tablet TAKE 1 TABLET (25 MG TOTAL) BY MOUTH 2 (TWO) TIMES DAILY WITH A MEAL. 180 tablet 3  . cetirizine (ZYRTEC) 10 MG tablet Take 10 mg by mouth daily as needed for allergies.    [name] clopidogrel (PLAVIX) 75 MG tablet TAKE 1 TABLET BY MOUTH EVERY DAY 90 tablet 3  . diphenhydrAMINE (BENADRYL) 25 mg capsule Take 2 capsules in AM and 1 capsule at night (Patient taking differently: Take 50 mg by mouth 2 (two) times daily as needed for itching or allergies. ) 270 capsule 3  . furosemide (LASIX) 40 MG tablet TAKE 1 TABLET BY MOUTH EVERY DAY 90 tablet 3  . gabapentin (NEURONTIN) 300 MG capsule Take 2 capsules (600 mg total) by mouth at bedtime. 180 capsule 0  . glimepiride (AMARYL) 2 MG tablet Take 2 mg by mouth 2 (two) times daily.  6  . guaiFENesin (MUCINEX) 600 MG 12 hr tablet Take 2 tablets (1,200 mg total) by mouth 2 (two) times daily. 360 tablet 3  . hydrochlorothiazide (HYDRODIURIL) 50 MG tablet Take 25 mg by mouth daily.    . isosorbide mononitrate (IMDUR) 30 MG 24 hr tablet Take 30 mg by mouth daily.  0  . KLOR-CON M10 10 MEQ tablet TAKE 1 TABLET BY MOUTH TWICE A DAY 180 tablet 3  . levETIRAcetam (KEPPRA) 1000 MG tablet TAKE 1 TABLET BY MOUTH TWICE A DAY 180 tablet 1  . losartan (COZAAR) 100 MG tablet TAKE 1 TABLET (100 MG TOTAL) BY MOUTH DAILY. 90 tablet 3  . metFORMIN (GLUCOPHAGE-XR) 500 MG 24 hr tablet Take 500 mg by mouth 2 (two) times daily.  6  . nitroGLYCERIN (NITROLINGUAL) 0.4 MG/SPRAY spray PLACE 1 SPRAY UNDER THE TONGUE EVERY 5 (FIVE) MINUTES X 3 DOSES AS NEEDED FOR CHEST PAIN. 12 g 3  . ONE TOUCH ULTRA TEST test strip USE AS DIRECTED FOR TESTING BLOOD GLUCOSE 3 TIMES DAILY  1  . polyethylene glycol powder  (GLYCOLAX/MIRALAX) powder TAKE 1 DOSE 17 GRAMS IN 8 OZ OF WATER DAILY AS NEEDED FOR CONSTIPATION. 527 g 3  . RANEXA 1000 MG SR tablet TAKE 1 TABLET BY MOUTH TWICE A DAY 180 tablet 1  . rosuvastatin (CRESTOR) 10 MG tablet TAKE 1 TABLET BY MOUTH DAILY 90 tablet 3  . sodium chloride (OCEAN) 0.65 % SOLN nasal spray Place 1 spray into both nostrils as needed for congestion.    [name] oxyCODONE (OXY IR/ROXICODONE) 5 MG immediate release tablet Take 1-2 tablets (5-10 mg total) by mouth every 4 (four) hours as needed for moderate pain. (Patient not taking: Reported on 04/21/2018) 6 tablet 0   No current facility-administered medications on file prior to visit.     Review of Systems  Constitutional: Positive for fatigue. Negative for activity change, appetite  change, fever and unexpected weight change.  HENT: Negative for congestion, ear pain, rhinorrhea, sinus pressure and sore throat.   Eyes: Negative for pain, redness and visual disturbance.  Respiratory: Negative for cough, shortness of breath and wheezing.   Cardiovascular: Negative for chest pain and palpitations.  Gastrointestinal: Negative for abdominal pain, blood in stool, constipation and diarrhea.  Endocrine: Negative for polydipsia and polyuria.  Genitourinary: Negative for dysuria, frequency and urgency.  Musculoskeletal: Positive for arthralgias and back pain. Negative for myalgias.       R hip pain  Skin: Negative for pallor and rash.  Allergic/Immunologic: Negative for environmental allergies.  Neurological: Negative for dizziness, seizures, syncope and headaches.       No recent seizures   Hematological: Negative for adenopathy. Does not bruise/bleed easily.  Psychiatric/Behavioral: Negative for decreased concentration and dysphoric mood. The patient is not nervous/anxious.        Objective:   Physical Exam  Constitutional: She appears well-developed and well-nourished. No distress.  obese and well appearing   HENT:  Head:  Normocephalic and atraumatic.  Right Ear: External ear normal.  Left Ear: External ear normal.  Mouth/Throat: Oropharynx is clear and moist.  Eyes: Pupils are equal, round, and reactive to light. Conjunctivae and EOM are normal. No scleral icterus.  Neck: Normal range of motion. Neck supple. No JVD present. Carotid bruit is present. No thyromegaly present.  Cardiovascular: Normal rate, regular rhythm and intact distal pulses. Exam reveals no gallop.  Murmur heard. Pulmonary/Chest: Effort normal and breath sounds normal. No respiratory distress. She has no wheezes. She exhibits no tenderness. No breast tenderness, discharge or bleeding.  Diffusely distant bs occ end exp wheeze occ cough No rales or rhonchi  Abdominal: Soft. Bowel sounds are normal. She exhibits no distension, no abdominal bruit and no mass. There is no tenderness.  Genitourinary: No breast tenderness, discharge or bleeding.  Genitourinary Comments: Breast exam: No mass, nodules, thickening, tenderness, bulging, retraction, inflamation, nipple discharge or skin changes noted.  No axillary or clavicular LA.      Musculoskeletal: Normal range of motion. She exhibits no edema or tenderness.  No kyphosis   Lymphadenopathy:    She has no cervical adenopathy.  Neurological: She is alert. She has normal reflexes. She displays normal reflexes. No cranial nerve deficit. She exhibits normal muscle tone. Coordination normal.  No tremor   Skin: Skin is warm and dry. No rash noted. No erythema. No pallor.  Tanned Solar lentigines diffusely   Some bruises on arms from anticoagulation   Psychiatric: She has a normal mood and affect.          Assessment & Plan:   Problem List Items Addressed This Visit      Cardiovascular and Mediastinum   Essential hypertension - Primary    bp in fair control at this time  BP Readings from Last 1 Encounters:  04/21/18 (!) 100/58   No changes needed Most recent labs reviewed  Disc  lifstyle change with low sodium diet and exercise        Relevant Medications   isosorbide mononitrate (IMDUR) 30 MG 24 hr tablet   Ischemic cardiomyopathy (Chronic)    Stable clinically      Relevant Medications   isosorbide mononitrate (IMDUR) 30 MG 24 hr tablet     Respiratory   Chronic respiratory failure ([facility])    Copd in heavy smoker -states she cannot quit  Continues smokers cough -occ productive   Has albuterol MDI Sees Dr  Sood in pulmonary       Smokers' cough ([place])    No change Pt states she cannot stop smoking         Endocrine   Goiter    Per pt watched by endocrinology      Type 2 diabetes mellitus with diabetic polyneuropathy, with long-term current use of insulin ([place])    Continues to see endocrinology  Unsure of last A1C She will make her own eye exam Disc foot care         Musculoskeletal and Integument   Osteopenia    dexa was 2014  (prev followed by gyn) She agrees to have another-will schedule that Small frame and smoking put her at risk Taking ca and D Hx of wrist fx as well        Other   B12 deficiency    Lab Results  Component Value Date   VITAMINB12 249 03/03/2018   Had a B12 shot  Now taking 1000 mcg of B12 daily orally  Will re check in 2-3 mo      Estrogen deficiency    Ref for dexa  Osteopenia in 2014      Relevant Orders   DG Bone Density   History of HPV infection    Pt is not interested in another pap at this time       Hyperlipidemia    Disc goals for lipids and reasons to control them Rev last labs with pt Rev low sat fat diet in detail Controlled with crestor and diet  Trig are down      Relevant Medications   isosorbide mononitrate (IMDUR) 30 MG 24 hr tablet   Screening mammogram, encounter for    Scheduled annual screening mammogram Nl breast exam today  Encouraged monthly self exams  She is overdue for imaging      Relevant Orders   MM DIGITAL SCREENING BILATERAL   TOBACCO USE    Disc in  detail risks of smoking and possible outcomes including copd, vascular/ heart disease, cancer , respiratory and sinus infections  Pt voices understanding She is aware and has tried many things to quit Has low hope of quitting currently  Also smokers cough and copd       Other Visit Diagnoses    Controlled diabetes mellitus type 2 with complications, unspecified whether long term insulin use ([place])

## 2018-04-21 NOTE — Assessment & Plan Note (Signed)
Copd in heavy smoker -states she cannot quit  Continues smokers cough -occ productive   Has albuterol MDI Sees Dr Halford Chessman in pulmonary

## 2018-04-21 NOTE — Patient Instructions (Addendum)
Don't forget to schedule your eye exam   Aim for 64 oz of fluid per day - mostly water   If you decide you want a pap test please let us know   We will refer you for a mammogram and bone density test   If you are interested in the new shingles vaccine (Shingrix) - call your local pharmacy to check on coverage and availability  If affordable- get on a wait list at your pharmacy

## 2018-04-21 NOTE — Assessment & Plan Note (Signed)
Pt is not interested in another pap at this time

## 2018-04-21 NOTE — Assessment & Plan Note (Signed)
Disc in detail risks of smoking and possible outcomes including copd, vascular/ heart disease, cancer , respiratory and sinus infections  Pt voices understanding She is aware and has tried many things to quit Has low hope of quitting currently  Also smokers cough and copd

## 2018-04-21 NOTE — Assessment & Plan Note (Signed)
Stable clinically.

## 2018-04-21 NOTE — Assessment & Plan Note (Signed)
Scheduled annual screening mammogram Nl breast exam today  Encouraged monthly self exams  She is overdue for imaging

## 2018-04-21 NOTE — Assessment & Plan Note (Signed)
dexa was 2014  (prev followed by gyn) She agrees to have another-will schedule that Small frame and smoking put her at risk Taking ca and D Hx of wrist fx as well

## 2018-04-21 NOTE — Assessment & Plan Note (Signed)
Ref for dexa  Osteopenia in 2014

## 2018-04-23 ENCOUNTER — Other Ambulatory Visit: Payer: Self-pay | Admitting: *Deleted

## 2018-04-23 DIAGNOSIS — I6521 Occlusion and stenosis of right carotid artery: Secondary | ICD-10-CM

## 2018-04-25 DIAGNOSIS — M545 Low back pain: Secondary | ICD-10-CM | POA: Diagnosis not present

## 2018-04-28 ENCOUNTER — Ambulatory Visit (INDEPENDENT_AMBULATORY_CARE_PROVIDER_SITE_OTHER): Payer: Medicare Other | Admitting: Neurology

## 2018-04-28 ENCOUNTER — Encounter: Payer: Self-pay | Admitting: Neurology

## 2018-04-28 VITALS — BP 110/70 | HR 76 | Ht <= 58 in | Wt 139.1 lb

## 2018-04-28 DIAGNOSIS — M5010 Cervical disc disorder with radiculopathy, unspecified cervical region: Secondary | ICD-10-CM

## 2018-04-28 DIAGNOSIS — G40009 Localization-related (focal) (partial) idiopathic epilepsy and epileptic syndromes with seizures of localized onset, not intractable, without status epilepticus: Secondary | ICD-10-CM

## 2018-04-28 DIAGNOSIS — F17200 Nicotine dependence, unspecified, uncomplicated: Secondary | ICD-10-CM | POA: Diagnosis not present

## 2018-04-28 DIAGNOSIS — I255 Ischemic cardiomyopathy: Secondary | ICD-10-CM | POA: Diagnosis not present

## 2018-04-28 NOTE — Progress Notes (Signed)
NEUROLOGY FOLLOW UP OFFICE NOTE  Diane [name] [id number]  HISTORY OF PRESENT ILLNESS: Diane [name] is a 62 year old right-handed woman with CAD s/p CABG, hypertension, hyperlipidemia, type II diabetes mellitus, tobacco use, stroke, depression, fibromyalgia, migraine, OSA and COPD who follows up for neck pain and seizure disorder.     UPDATE: Seizure disorder: She is off of Dilantin.  She is doing well Medications:  Keppra 1037m twice daily, gabapentin 6067mat bedtime (for neck pain) Last spell:  03/18/13.     Neck pain: She is taking gabapentin 60066mt bedtime.  Pain has been controlled.     Labs: 03/03/18:  CBC with WBC 9, HGB 12.6, HCT 38.4, PLT 249; CMP with Na 135, K 4.7, glucose 222, BUN 21, Cr 0.70, t bili 0.3, ALP 60, AST 7, ALT 10; B12 249. 04/14/18:  TSH 1.64   HISTORY: Seizure disorder: [facility] for recurrent seizure-like episodes.  These spells are were witnessed by Dr. ChaWallie Chareuro-hospitalist, and described as "head turning to the right as well as eye deviation to the right, stiffening of right extremities with flexion of right upper extremity, and convulsive movements of right extremities. Spell lasted about 40-50 seconds and resolved without intervention. Within 30 seconds patient was again awake and conversant. She was slightly confused with respect to time."  She is unable to speak during these events.  Sometimes she was aware during these events and other times she was unaware and amnestic.  She did have urinary incontinence but no tongue biting.  It is reported that she can write down what she wants to say during these spells.  Given her stroke history, she was evaluated for possible left hemispheric stroke.  By his assessment, it seemed that her speech difficulties were non-organic, seizure-like activity non-organic, and he did not suspect acute stroke.     She had continuous EEG monitoring, which  captured multiple spells without electrographic correlate.  However, she did have one captured clinical event, described as "build up to rhythmical theta activity and resultant slowing companied by clinical changes.  This may very well represent epileptiform activity.  No interictal abnormalities were noted".  Psychiatry was consulted, regarding possible pseudoseizures.  Despite the possible diagnosis of pseudoseizures, the neuro-hospitalists never made a sole diagnosis of pseudoseizures in the hospital notes.     I personally reviewed the EEG.  At 1:57:48, patient pauses, head slightly turned left, staring, with little movement and no convulsions.  She does pull at the covers a little, but no appreciable stereotypical hand movements.  When it is over, she begins moving around again.  Electrographically, she does have a build up of generalized theta activity up until 1:58:26, followed by generalized delta slowing lasting until 1:58:45.  Difficult to lateralize, but may be more prominent in the left hemisphere.  However, this appears to only be a clip of the event and the rest of the study is not available to me.  Therefore I cannot comment on the other captured spells.  She also reports episodes of tremor involving the head, hands and legs.  It is not positional.  It lasts 20 to 30 minutes.  She has had it for many years.  Blood pressure and blood sugar readings are normal.  She says her sister has Parkinson's disease.   MRI of brain performed on 03/03/13, which reported possible tiny acute infarct in the posterior left frontal lobe, but neurology believed this  to be artifact.  There was also mention of incidental tiny cysts in right hippocampal region.  Encephalomalacia in the right frontoparietal region, from prior remote stroke, is seen.  MRI of brain without contrast from 12/08/15 showed chronic right MCA infarct but also showed chronic looking lacunar infarcts in the bilateral basal ganglia and left thalamus,  which are new compared to prior imaging from June 2014.   Prior AEDs:  Dilantin ER 171m   Cervical radiculopathy: She has history of right sided neck pain radiating down to the right shoulder, associated with tingling from the right ear down to the shoulder.  Hot and cold compresses help too.  No numbness, pain or weakness down the arm.  Pain worse when turning neck to the left.  She has trigger finger and numbness in the fingers when driving or picking up objects, thought to be carpal tunnel syndrome.  She has history of cervical fusion in the 1980s.  PT was ineffective.  .[name] MRI of the cervical spine performed on 05/23/14 showed moderately large foraminal disc protrusion and associated osteophyte at C3-4, causing right foraminal encroachment.  Solid fusion C5 through C7 noted.  She was referred for right C4 nerve root injection, however it was not done.    PAST MEDICAL HISTORY: Past Medical History:  Diagnosis Date  . Anemia   . Carotid stenosis    a. s/p right carotid stent 10/2017.  .[place] KitchenChronic systolic CHF (congestive heart failure) ([facility]   . Colon polyps   . [place] [place]   . Coronary artery disease    post CABG in 3/07 , coronary stents   . Diabetes mellitus    type 2  . Dyslipidemia   . Fibromyalgia   . GERD (gastroesophageal reflux disease)   . Headache    hx of   . Hyperlipidemia   . Hypertension   . Myocardial infarction ([facility]   . Pneumonia    hx of   . Seizures ([facility]    last seizure- 03/2013   . Shortness of breath dyspnea    with exertion or when fluid builds up   . Sleep apnea    used to wear a cpap- not used in 3 years   . Status post dilation of esophageal narrowing   . Stroke ([facility]   problems with balance   . Systolic murmur    known mild AS and MR  . Thyroid goiter   . Tobacco abuse     MEDICATIONS: Current Outpatient Medications on File Prior to Visit  Medication Sig Dispense Refill  . acetaminophen (TYLENOL) 500  MG tablet Take 1,000 mg by mouth every 6 (six) hours as needed for moderate pain or headache.    . albuterol (VENTOLIN HFA) 108 (90 Base) MCG/ACT inhaler INHALER 2 PUFFS EVERY 6 HOURS AS NEEDED FOR WHEEZING/SHORTNESS OF BREATH 1 Inhaler 0  . aspirin EC 81 MG tablet Take 81 mg by mouth every evening.  30 tablet 6  . carvedilol (COREG) 25 MG tablet TAKE 1 TABLET (25 MG TOTAL) BY MOUTH 2 (TWO) TIMES DAILY WITH A MEAL. 180 tablet 3  . cetirizine (ZYRTEC) 10 MG tablet Take 10 mg by mouth daily as needed for allergies.    .Marland Kitchenclopidogrel (PLAVIX) 75 MG tablet TAKE 1 TABLET BY MOUTH EVERY DAY 90 tablet 3  . dexlansoprazole (DEXILANT) 60 MG capsule TAKE 1 CAPSULE (60 MG TOTAL) BY MOUTH DAILY. 30 capsule 11  . diphenhydrAMINE (BENADRYL) 25 mg capsule  Take 2 capsules in AM and 1 capsule at night (Patient taking differently: Take 50 mg by mouth 2 (two) times daily as needed for itching or allergies. ) 270 capsule 3  . fluticasone (FLONASE) 50 MCG/ACT nasal spray Place 2 sprays into both nostrils daily. 16 g 11  . furosemide (LASIX) 40 MG tablet TAKE 1 TABLET BY MOUTH EVERY DAY 90 tablet 3  . gabapentin (NEURONTIN) 300 MG capsule Take 2 capsules (600 mg total) by mouth at bedtime. 180 capsule 0  . glimepiride (AMARYL) 2 MG tablet Take 2 mg by mouth 2 (two) times daily.  6  . guaiFENesin (MUCINEX) 600 MG 12 hr tablet Take 2 tablets (1,200 mg total) by mouth 2 (two) times daily. 360 tablet 3  . hydrochlorothiazide (HYDRODIURIL) 50 MG tablet Take 25 mg by mouth daily.    . isosorbide mononitrate (IMDUR) 30 MG 24 hr tablet Take 30 mg by mouth daily.  0  . KLOR-CON M10 10 MEQ tablet TAKE 1 TABLET BY MOUTH TWICE A DAY 180 tablet 3  . levETIRAcetam (KEPPRA) 1000 MG tablet TAKE 1 TABLET BY MOUTH TWICE A DAY 180 tablet 1  . losartan (COZAAR) 100 MG tablet TAKE 1 TABLET (100 MG TOTAL) BY MOUTH DAILY. 90 tablet 3  . metFORMIN (GLUCOPHAGE-XR) 500 MG 24 hr tablet Take 500 mg by mouth 2 (two) times daily.  6  . nitroGLYCERIN  (NITROLINGUAL) 0.4 MG/SPRAY spray PLACE 1 SPRAY UNDER THE TONGUE EVERY 5 (FIVE) MINUTES X 3 DOSES AS NEEDED FOR CHEST PAIN. 12 g 3  . ONE TOUCH ULTRA TEST test strip USE AS DIRECTED FOR TESTING BLOOD GLUCOSE 3 TIMES DAILY  1  . oxyCODONE (OXY IR/ROXICODONE) 5 MG immediate release tablet Take 1-2 tablets (5-10 mg total) by mouth every 4 (four) hours as needed for moderate pain. 6 tablet 0  . polyethylene glycol powder (GLYCOLAX/MIRALAX) powder TAKE 1 DOSE 17 GRAMS IN 8 OZ OF WATER DAILY AS NEEDED FOR CONSTIPATION. 527 g 3  . RANEXA 1000 MG SR tablet TAKE 1 TABLET BY MOUTH TWICE A DAY 180 tablet 1  . rosuvastatin (CRESTOR) 10 MG tablet TAKE 1 TABLET BY MOUTH DAILY 90 tablet 3  . sodium chloride (OCEAN) 0.65 % SOLN nasal spray Place 1 spray into both nostrils as needed for congestion.     No current facility-administered medications on file prior to visit.     ALLERGIES: Allergies  Allergen Reactions  . Bee Venom Itching and Swelling  . Entresto [Sacubitril-Valsartan] Cough  . Tetracycline     Unknown   . Ace Inhibitors Cough    FAMILY HISTORY: Family History  Problem Relation Age of Onset  . Heart disease Mother   . Diabetes Mother   . COPD Mother   . Hyperlipidemia Mother   . Hypertension Mother   . Cancer Father        met, origin unknown  . Heart attack Father   . Drug abuse Paternal Grandmother   . Stroke Paternal Grandfather   . Lung cancer Paternal Aunt        lung with mets to brain  . Melanoma Paternal Uncle   . Lung cancer Paternal Uncle        lung/liver to brain  . Cancer Paternal Uncle        cancer of unknown type  . Heart disease Maternal Grandfather   . Hypertension Sister   . Cancer Sister        eyelid  . Glaucoma Sister   .  Heart disease Maternal Aunt        x 2 aunts    SOCIAL HISTORY: Social History   Socioeconomic History  . Marital status: Divorced    Spouse name: Not on file  . Number of children: 0  . Years of education: Not on file  .  Highest education level: Not on file  Occupational History  . Occupation: disabled  Social Needs  . Financial resource strain: Not on file  . Food insecurity:    Worry: Not on file    Inability: Not on file  . Transportation needs:    Medical: Not on file    Non-medical: Not on file  Tobacco Use  . Smoking status: Current Every Day Smoker    Packs/day: 1.50    Years: 50.00    Pack years: 75.00    Types: Cigarettes  . Smokeless tobacco: Never Used  . Tobacco comment: 1.5 pack to 2 packs per day  Substance and Sexual Activity  . Alcohol use: No    Alcohol/week: 0.6 oz    Types: 1 Standard drinks or equivalent per week  . Drug use: No  . Sexual activity: Not Currently    Partners: Male    Birth control/protection: None  Lifestyle  . Physical activity:    Days per week: Not on file    Minutes per session: Not on file  . Stress: Not on file  Relationships  . Social connections:    Talks on phone: Not on file    Gets together: Not on file    Attends religious service: Not on file    Active member of club or organization: Not on file    Attends meetings of clubs or organizations: Not on file    Relationship status: Not on file  . Intimate partner violence:    Fear of current or ex partner: Not on file    Emotionally abused: Not on file    Physically abused: Not on file    Forced sexual activity: Not on file  Other Topics Concern  . Not on file  Social History Narrative  . Not on file    REVIEW OF SYSTEMS: Constitutional: No fevers, chills, or sweats, no generalized fatigue, change in appetite Eyes: No visual changes, double vision, eye pain Ear, nose and throat: No hearing loss, ear pain, nasal congestion, sore throat Cardiovascular: No chest pain, palpitations Respiratory:  No shortness of breath at rest or with exertion, wheezes GastrointestinaI: No nausea, vomiting, diarrhea, abdominal pain, fecal incontinence Genitourinary:  No dysuria, urinary retention or  frequency Musculoskeletal:  No neck pain, back pain Integumentary: No rash, pruritus, skin lesions Neurological: as above Psychiatric: No depression, insomnia, anxiety Endocrine: No palpitations, fatigue, diaphoresis, mood swings, change in appetite, change in weight, increased thirst Hematologic/Lymphatic:  No purpura, petechiae. Allergic/Immunologic: no itchy/runny eyes, nasal congestion, recent allergic reactions, rashes  PHYSICAL EXAM: Vitals:   04/28/18 1327  BP: 110/70  Pulse: 76  SpO2: 96%   General: No acute distress.  Patient appears well-groomed.  Head:  Normocephalic/atraumatic Eyes:  Fundi examined but not visualized Neck: supple, no paraspinal tenderness, full range of motion Heart:  Regular rate and rhythm Lungs:  Clear to auscultation bilaterally Back: No paraspinal tenderness Neurological Exam: alert and oriented to person, place, and time. Attention span and concentration intact, recent and remote memory intact, fund of knowledge intact.  Speech fluent and not dysarthric, language intact.  CN II-XII intact. Bulk and tone normal, muscle strength 5/5 throughout.  Sensation to light touch  intact.  Deep tendon reflexes 2+ throughout.  Toes downgoing.  Finger to nose testing with mild intention tremor bilaterally.  Wide-based cautious gait.  IMPRESSION: Seizure disorder Cervical radiculopathy Tobacco use  PLAN: 1.  Continue Keppra 1052m twice daily and gabapentin 6016mat bedtime 2.  Tobacco cessation counseling (CPT 99406):  Tobacco use with CAD  - Currently smoking 1.5 packs/day   - Patient was informed of the dangers of tobacco abuse including stroke, cancer, and MI, as well as benefits of tobacco cessation. - Patient is willing to quit at this time. - Approximately 5 mins were spent counseling patient cessation techniques. We discussed various methods to help quit smoking, including deciding on a date to quit, joining a support group, pharmacological agents-  nicotine gum/patch/lozenges, chantix.  - I will reassess her progress at the next follow-up visit 3.  Follow up in 6 months.  15 minutes spent face to face with patient, over 50% spent discussing management.  AdMetta ClinesDO  CC:  MaLoura PardonMD

## 2018-04-28 NOTE — Patient Instructions (Addendum)
1.  Continue Keppra 1000mg  twice daily and gabapentin 600mg  at bedtime 2.  Try to quit smoking 3.  Follow up in 6 months.

## 2018-05-01 DIAGNOSIS — M545 Low back pain: Secondary | ICD-10-CM | POA: Diagnosis not present

## 2018-05-14 ENCOUNTER — Other Ambulatory Visit: Payer: Self-pay | Admitting: Family Medicine

## 2018-05-14 DIAGNOSIS — Z1231 Encounter for screening mammogram for malignant neoplasm of breast: Secondary | ICD-10-CM

## 2018-05-20 DIAGNOSIS — S300XXD Contusion of lower back and pelvis, subsequent encounter: Secondary | ICD-10-CM | POA: Diagnosis not present

## 2018-05-20 DIAGNOSIS — M545 Low back pain: Secondary | ICD-10-CM | POA: Diagnosis not present

## 2018-05-20 DIAGNOSIS — M7061 Trochanteric bursitis, right hip: Secondary | ICD-10-CM | POA: Diagnosis not present

## 2018-05-25 NOTE — Progress Notes (Signed)
 Diane [name] Date of Birth: 06/13/1956 Medical Record #[id number]  History of Present Illness: Diane [name] is seen for follow up CAD. She has a history of coronary disease and is status post CABG in March of 2007 by Dr. Hendrickson. She presented later that year with recurrent angina. Repeat cardiac cath showed patent LIMA to the LAD but all other grafts occluded including SVG to OM, SVG to AC/PL, and radial graft to ramus intermediate. She then had complex stenting of the mid LCx and first OM with Taxus stents. The native RCA was occluded with collaterals.   In 2016 she was evaluated for abdominal surgery. Myoview abnormal. Cardiac cath as noted above but now stents in LCx and OM occluded. No suitable targets for PCI or surgery. Severe LV dysfunction with moderate pulmonary HTN and normal LV filling pressures. She has multiple cardiac risk factors including diabetes, dyslipidemia, hypertension, and tobacco abuse. She also has  bilateral carotid arterial disease. She continues to smoke.   In August 2016 she had removal of a large pelvic mass with BSO and sigmoid colectomy. Path c/w ovarian cystadenoma. No complications.   She is followed by Dr. Berry for her carotid disease. Angiogram performed with findings of 70% bilateral stenoses. The LICA was heavily calcified. Both arteries had acute angulation after the stenosis making it less favorable for stenting. In March she developed dizziness, lightheadedness, and imbalance. Dr. Tower ordered an MRI that showed an old right MCA infarct and chronic lacunar infarcts that were new from 2014.   On a prior visit with me we switched her losartan to Entresto. She developed a persistent cough with clear phlegm. . Entresto was stopped and losartan resumed- cough resolved.  CXR showed no active disease.   She previously complained of pain in her thighs bilaterally. LE dopplers shows good ABIs of 0.93 bilaterally. Mild right iliac stenosis. Moderate right femoral and  SFA disease. Seen by Dr. Berry and medical therapy recommended. This past fall carotid dopplers showed progression of right carotid disease. She was not a candidate for CEA so underwent right carotid stenting with good results.   She had a fall earlier this year with nondisplaced wrist fracture.   On follow up today she is doing fairly well from a cardiac standpoint. [place]. She is walking more. Notes legs fatigue easily and she uses a walker. She has chest pain a couple of times a week and takes sl Ntg. Breathing is doing well. Sometimes has mild edema in the evening. At times she notes feeling lightheaded and BP drops to 96 systolic.  Denies any increased SOB or chest pain. No new neurologic symptoms. Weight has been stable and she has no edema.   Current Outpatient Medications on File Prior to Visit  Medication Sig Dispense Refill  . acetaminophen (TYLENOL) 500 MG tablet Take 1,000 mg by mouth every 6 (six) hours as needed for moderate pain or headache.    . albuterol (VENTOLIN HFA) 108 (90 Base) MCG/ACT inhaler INHALER 2 PUFFS EVERY 6 HOURS AS NEEDED FOR WHEEZING/SHORTNESS OF BREATH 1 Inhaler 0  . aspirin EC 81 MG tablet Take 81 mg by mouth every evening.  30 tablet 6  . carvedilol (COREG) 25 MG tablet TAKE 1 TABLET (25 MG TOTAL) BY MOUTH 2 (TWO) TIMES DAILY WITH A MEAL. 180 tablet 3  . cetirizine (ZYRTEC) 10 MG tablet Take 10 mg by mouth daily as needed for allergies.    . clopidogrel (PLAVIX) 75   MG tablet TAKE 1 TABLET BY MOUTH EVERY DAY 90 tablet 3  . dexlansoprazole (DEXILANT) 60 MG capsule TAKE 1 CAPSULE (60 MG TOTAL) BY MOUTH DAILY. 30 capsule 11  . diphenhydrAMINE (BENADRYL) 25 mg capsule Take 2 capsules in AM and 1 capsule at night (Patient taking differently: Take 50 mg by mouth 2 (two) times daily as needed for itching or allergies. ) 270 capsule 3  . fluticasone (FLONASE) 50 MCG/ACT nasal spray Place 2 sprays into both nostrils daily. 16 g 11  .  furosemide (LASIX) 40 MG tablet TAKE 1 TABLET BY MOUTH EVERY DAY 90 tablet 3  . gabapentin (NEURONTIN) 300 MG capsule Take 2 capsules (600 mg total) by mouth at bedtime. 180 capsule 0  . glimepiride (AMARYL) 2 MG tablet Take 2 mg by mouth 2 (two) times daily.  6  . guaiFENesin (MUCINEX) 600 MG 12 hr tablet Take 2 tablets (1,200 mg total) by mouth 2 (two) times daily. 360 tablet 3  . hydrochlorothiazide (HYDRODIURIL) 50 MG tablet Take 25 mg by mouth daily.    . isosorbide mononitrate (IMDUR) 30 MG 24 hr tablet Take 30 mg by mouth daily.  0  . KLOR-CON M10 10 MEQ tablet TAKE 1 TABLET BY MOUTH TWICE A DAY 180 tablet 3  . levETIRAcetam (KEPPRA) 1000 MG tablet TAKE 1 TABLET BY MOUTH TWICE A DAY 180 tablet 1  . metFORMIN (GLUCOPHAGE-XR) 500 MG 24 hr tablet Take 500 mg by mouth 2 (two) times daily.  6  . nitroGLYCERIN (NITROLINGUAL) 0.4 MG/SPRAY spray PLACE 1 SPRAY UNDER THE TONGUE EVERY 5 (FIVE) MINUTES X 3 DOSES AS NEEDED FOR CHEST PAIN. 12 g 3  . ONE TOUCH ULTRA TEST test strip USE AS DIRECTED FOR TESTING BLOOD GLUCOSE 3 TIMES DAILY  1  . oxyCODONE (OXY IR/ROXICODONE) 5 MG immediate release tablet Take 1-2 tablets (5-10 mg total) by mouth every 4 (four) hours as needed for moderate pain. 6 tablet 0  . polyethylene glycol powder (GLYCOLAX/MIRALAX) powder TAKE 1 DOSE 17 GRAMS IN 8 OZ OF WATER DAILY AS NEEDED FOR CONSTIPATION. 527 g 3  . RANEXA 1000 MG SR tablet TAKE 1 TABLET BY MOUTH TWICE A DAY 180 tablet 1  . rosuvastatin (CRESTOR) 10 MG tablet TAKE 1 TABLET BY MOUTH DAILY 90 tablet 3  . sodium chloride (OCEAN) 0.65 % SOLN nasal spray Place 1 spray into both nostrils as needed for congestion.     No current facility-administered medications on file prior to visit.     Allergies  Allergen Reactions  . Bee Venom Itching and Swelling  . Entresto [Sacubitril-Valsartan] Cough  . Tetracycline     Unknown   . Ace Inhibitors Cough    Past Medical History:  Diagnosis Date  . Anemia   . Carotid  stenosis    a. s/p right carotid stent 10/2017.  . Chronic systolic CHF (congestive heart failure) (HCC)   . Colon polyps   . COPD (chronic obstructive pulmonary disease) (HCC)   . Coronary artery disease    post CABG in 3/07 , coronary stents   . Diabetes mellitus    type 2  . Dyslipidemia   . Fibromyalgia   . GERD (gastroesophageal reflux disease)   . Headache    hx of   . Hyperlipidemia   . Hypertension   . Myocardial infarction (HCC)   . Pneumonia    hx of   . Seizures (HCC)    last seizure- 03/2013   . Shortness of breath   dyspnea    with exertion or when fluid builds up   . Sleep apnea    used to wear a cpap- not used in 3 years   . Status post dilation of esophageal narrowing   . Stroke (HCC) 1993   problems with balance   . Systolic murmur    known mild AS and MR  . Thyroid goiter   . Tobacco abuse     Past Surgical History:  Procedure Laterality Date  . APPENDECTOMY    . CARDIAC CATHETERIZATION  11/29/05   EF of 55%  . CARDIAC CATHETERIZATION  08/06/06   EF of 45-50%  . CARDIAC CATHETERIZATION N/A 05/06/2015   Procedure: Right/Left Heart Cath and Coronary/Graft Angiography;  Surgeon: Peter M Jordan, MD;  Location: [facility];  Service: Cardiovascular;  Laterality: N/A;  . CAROTID PTA/STENT INTERVENTION N/A 10/10/2017   Procedure: CAROTID PTA/STENT INTERVENTION - Right;  Surgeon: Brabham, Vance W, MD;  Location: [facility];  Service: Cardiovascular;  Laterality: N/A;  . CERVICAL FUSION  1990  . CHOLECYSTECTOMY    . COLON RESECTION     mass removed and 4 in of colon  . COLONOSCOPY WITH PROPOFOL N/A 07/16/2017   Procedure: COLONOSCOPY WITH PROPOFOL;  Surgeon: Stark, Malcolm T, MD;  Location: [facility];  Service: Endoscopy;  Laterality: N/A;  . CORONARY ARTERY BYPASS GRAFT  12/04/2005   x5 -- left internal mammary artery to the LAD, left radial artery to the ramus intermedius, saphenous vein graft to the obtuse marginal 1, sequential saphenous vein  grat to the acute marginal and posterior descending, endoscopic vein harvesting from the left thigh with open vein harvest from right leg  . CORONARY STENT PLACEMENT  08/11/06   PCI of her ciurcumflex/OM vessel  . LAPAROTOMY Bilateral 05/19/2015   Procedure: EXPLORATORY LAPAROTOMY WITH BILATERAL SALPINGO OOPHORECTOMY /OMENTECTOMY/SEGMENTAL SIGMOID COLECTOMY ;  Surgeon: Emma Rossi, MD;  Location: [facility];  Service: Gynecology;  Laterality: Bilateral;  . PERIPHERAL VASCULAR CATHETERIZATION N/A 11/15/2015   Procedure: Carotid PTA/Stent Intervention;  Surgeon: Jonathan J Berry, MD;  Location: [facility];  Service: Cardiovascular;  Laterality: N/A;  . PERIPHERAL VASCULAR CATHETERIZATION  11/15/2015   Procedure: Carotid Angiography;  Surgeon: Jonathan J Berry, MD;  Location: [facility];  Service: Cardiovascular;;    Social History   Tobacco Use  Smoking Status Current Every Day Smoker  . Packs/day: 1.50  . Years: 50.00  . Pack years: 75.00  . Types: Cigarettes  Smokeless Tobacco Never Used  Tobacco Comment   1.5 pack to 2 packs per day    Social History   Substance and Sexual Activity  Alcohol Use No  . Alcohol/week: 1.0 standard drinks  . Types: 1 Standard drinks or equivalent per week    Family History  Problem Relation Age of Onset  . Heart disease Mother   . Diabetes Mother   . COPD Mother   . Hyperlipidemia Mother   . Hypertension Mother   . Cancer Father        met, origin unknown  . Heart attack Father   . Drug abuse Paternal Grandmother   . Stroke Paternal Grandfather   . Lung cancer Paternal Aunt        lung with mets to brain  . Melanoma Paternal Uncle   . Lung cancer Paternal Uncle        lung/liver to brain  . Cancer Paternal Uncle        cancer of   unknown type  . Heart disease Maternal Grandfather   . Hypertension Sister   . Cancer Sister        eyelid  . Glaucoma Sister   . Heart disease Maternal Aunt        x 2 aunts    Review of  Systems: As noted in history of present illness.  All other systems were reviewed and are negative.  Physical Exam: BP (!) 104/59   Pulse 86   Ht 4' 9.5" (1.461 m)   Wt 138 lb 3.2 oz (62.7 kg)   LMP 09/02/2003   BMI 29.39 kg/m  GENERAL:  Well appearing WF in NAD HEENT:  PERRL, EOMI, sclera are clear. Oropharynx is clear. NECK:  No jugular venous distention, bilateral carotid bruits L>R, no thyromegaly or adenopathy LUNGS:  Clear to auscultation bilaterally CHEST:  Unremarkable HEART:  RRR,  PMI not displaced or sustained,S1 and S2 within normal limits, no S3, no S4: no clicks, no rubs, gr 2/ systolic murmur RUSB.  ABD:  Soft, nontender. BS +, no masses or bruits. No hepatomegaly, no splenomegaly EXT:  1+ pulses throughout, no edema, no cyanosis no clubbing SKIN:  Warm and dry.  No rashes NEURO:  Alert and oriented x 3. Cranial nerves II through XII intact. PSYCH:  Cognitively intact    LABORATORY DATA: Lab Results  Component Value Date   WBC 9.0 03/03/2018   HGB 12.6 03/03/2018   HCT 38.4 03/03/2018   PLT 249.0 03/03/2018   GLUCOSE 222 (H) 03/03/2018   CHOL 146 04/14/2018   TRIG 149.0 04/14/2018   HDL 44.90 04/14/2018   LDLDIRECT 76.0 04/11/2017   LDLCALC 71 04/14/2018   ALT 10 03/03/2018   AST 7 03/03/2018   NA 135 03/03/2018   K 4.7 03/03/2018   CL 98 03/03/2018   CREATININE 0.70 03/03/2018   BUN 21 03/03/2018   CO2 30 03/03/2018   TSH 1.64 04/14/2018   INR 1.05 11/10/2015   HGBA1C 8.0 (H) 06/09/2014   MICROALBUR 1.9 06/09/2013    Assessment / Plan: 1. CAD S/p CABG. Failed bypass grafts except for LIMA to LAD. S/p remote stenting of LCX and OM with Taxus DES now occluded. Stable class 2 angina. Continue aggressive antianginal Rx. With ASA, plavix, nitrates, coreg, and Ranexa. Once again encouraged  smoking cessation.  2. Carotid arterial disese- s/p right carotid stenting. Followed by Dr. Berry. 3. Tobacco abuse. Recommend complete cessation.  4. HTN  controlled. 5. Chronic combined systolic and diastolic CHF. Severe LV dysfunction. She appears to be well compensated today without edema. Weight stable.  I am concerned that she is getting low BP at times. Will reduce losartan to 50 mg daily. Sodium restriction.  6. Pulmonary HTN. 7. S/p major abdominal surgery for cystadenoma. Now with CT showing evidence of incisional hernia. Poor candidate for general anesthesia. Conservative management.  8. Lacunar infarcts.  9. PAD. Dopplers in May showed no significant obstruction.   I will follow up in 6 months. 

## 2018-05-30 ENCOUNTER — Encounter: Payer: Self-pay | Admitting: Cardiology

## 2018-05-30 ENCOUNTER — Ambulatory Visit (INDEPENDENT_AMBULATORY_CARE_PROVIDER_SITE_OTHER): Payer: Medicare Other | Admitting: Cardiology

## 2018-05-30 VITALS — BP 104/59 | HR 86 | Ht <= 58 in | Wt 138.2 lb

## 2018-05-30 DIAGNOSIS — I10 Essential (primary) hypertension: Secondary | ICD-10-CM | POA: Diagnosis not present

## 2018-05-30 DIAGNOSIS — I209 Angina pectoris, unspecified: Secondary | ICD-10-CM | POA: Diagnosis not present

## 2018-05-30 DIAGNOSIS — I739 Peripheral vascular disease, unspecified: Secondary | ICD-10-CM

## 2018-05-30 DIAGNOSIS — I779 Disorder of arteries and arterioles, unspecified: Secondary | ICD-10-CM

## 2018-05-30 DIAGNOSIS — I255 Ischemic cardiomyopathy: Secondary | ICD-10-CM

## 2018-05-30 DIAGNOSIS — Z951 Presence of aortocoronary bypass graft: Secondary | ICD-10-CM | POA: Diagnosis not present

## 2018-05-30 DIAGNOSIS — I5042 Chronic combined systolic (congestive) and diastolic (congestive) heart failure: Secondary | ICD-10-CM

## 2018-05-30 MED ORDER — LOSARTAN POTASSIUM 100 MG PO TABS: 50.0000 mg | ORAL_TABLET | Freq: Every day | ORAL | 3 refills | Status: DC

## 2018-05-30 NOTE — Patient Instructions (Signed)
Reduce losartan to 50 mg daily  Continue  Your other therapy

## 2018-06-01 ENCOUNTER — Telehealth: Payer: Self-pay | Admitting: Family Medicine

## 2018-06-01 DIAGNOSIS — E538 Deficiency of other specified B group vitamins: Secondary | ICD-10-CM

## 2018-06-01 NOTE — Telephone Encounter (Signed)
-----   Message from Ellamae Sia sent at 05/26/2018  4:12 PM EDT ----- Regarding: Lab orders for Tuesday, 06-03-18 Lab orders, thanks

## 2018-06-03 ENCOUNTER — Other Ambulatory Visit (INDEPENDENT_AMBULATORY_CARE_PROVIDER_SITE_OTHER): Payer: Medicare Other

## 2018-06-03 DIAGNOSIS — I509 Heart failure, unspecified: Secondary | ICD-10-CM | POA: Diagnosis not present

## 2018-06-03 DIAGNOSIS — E538 Deficiency of other specified B group vitamins: Secondary | ICD-10-CM

## 2018-06-03 DIAGNOSIS — G629 Polyneuropathy, unspecified: Secondary | ICD-10-CM | POA: Diagnosis not present

## 2018-06-03 DIAGNOSIS — E119 Type 2 diabetes mellitus without complications: Secondary | ICD-10-CM | POA: Diagnosis not present

## 2018-06-03 DIAGNOSIS — E1121 Type 2 diabetes mellitus with diabetic nephropathy: Secondary | ICD-10-CM | POA: Diagnosis not present

## 2018-06-03 DIAGNOSIS — I10 Essential (primary) hypertension: Secondary | ICD-10-CM | POA: Diagnosis not present

## 2018-06-03 DIAGNOSIS — E7800 Pure hypercholesterolemia, unspecified: Secondary | ICD-10-CM | POA: Diagnosis not present

## 2018-06-03 DIAGNOSIS — E041 Nontoxic single thyroid nodule: Secondary | ICD-10-CM | POA: Diagnosis not present

## 2018-06-03 LAB — VITAMIN B12: Vitamin B-12: 309 pg/mL (ref 211–911)

## 2018-06-09 ENCOUNTER — Ambulatory Visit (INDEPENDENT_AMBULATORY_CARE_PROVIDER_SITE_OTHER)
Admission: RE | Admit: 2018-06-09 | Discharge: 2018-06-09 | Disposition: A | Discharge status: Home / Self Care | Payer: Medicare Other | Source: Ambulatory Visit | Attending: Pulmonary Disease | Admitting: Pulmonary Disease

## 2018-06-09 ENCOUNTER — Ambulatory Visit (INDEPENDENT_AMBULATORY_CARE_PROVIDER_SITE_OTHER): Payer: Medicare Other | Admitting: Pulmonary Disease

## 2018-06-09 ENCOUNTER — Encounter: Payer: Self-pay | Admitting: Pulmonary Disease

## 2018-06-09 ENCOUNTER — Other Ambulatory Visit: Payer: Self-pay | Admitting: Neurology

## 2018-06-09 VITALS — BP 126/70 | HR 81 | Temp 97.3°F | Ht 58.27 in | Wt 138.0 lb

## 2018-06-09 DIAGNOSIS — J449 Chronic obstructive pulmonary disease, unspecified: Secondary | ICD-10-CM | POA: Diagnosis not present

## 2018-06-09 DIAGNOSIS — G4733 Obstructive sleep apnea (adult) (pediatric): Secondary | ICD-10-CM | POA: Diagnosis not present

## 2018-06-09 DIAGNOSIS — R05 Cough: Secondary | ICD-10-CM | POA: Diagnosis not present

## 2018-06-09 DIAGNOSIS — J9611 Chronic respiratory failure with hypoxia: Secondary | ICD-10-CM | POA: Diagnosis not present

## 2018-06-09 DIAGNOSIS — F17210 Nicotine dependence, cigarettes, uncomplicated: Secondary | ICD-10-CM

## 2018-06-09 DIAGNOSIS — F17200 Nicotine dependence, unspecified, uncomplicated: Secondary | ICD-10-CM

## 2018-06-09 MED ORDER — TIOTROPIUM BROMIDE-OLODATEROL 2.5-2.5 MCG/ACT IN AERS: 2.0000 | INHALATION_SPRAY | Freq: Every day | RESPIRATORY_TRACT | 0 refills | Status: DC

## 2018-06-09 MED ORDER — AZITHROMYCIN 250 MG PO TABS: ORAL_TABLET | ORAL | 0 refills | Status: DC

## 2018-06-09 MED ORDER — PREDNISONE 10 MG PO TABS: ORAL_TABLET | ORAL | 0 refills | Status: DC

## 2018-06-09 NOTE — Assessment & Plan Note (Signed)
Continue nocturnal oxygen 

## 2018-06-09 NOTE — Assessment & Plan Note (Signed)
Split-night sleep study to be ordered Continue nocturnal oxygen at this time

## 2018-06-09 NOTE — Progress Notes (Signed)
Patient seen in the office today and instructed on use of Stiolto.  Patient expressed understanding and demonstrated technique. Parke Poisson, CMA 06/09/18

## 2018-06-09 NOTE — Progress Notes (Signed)
Reviewed and agree with assessment/plan.   Shailee Foots, MD  Pulmonary/Critical Care 09/26/2016, 12:24 PM Pager:  [phone]  

## 2018-06-09 NOTE — Addendum Note (Signed)
Addended by: Parke Poisson E on: 06/09/2018 05:31 PM   Modules accepted: Orders

## 2018-06-09 NOTE — Progress Notes (Signed)
Was able to talk to patient regarding patient's results.  They verbalized an understanding of what was discussed. No further questions at this time. 

## 2018-06-09 NOTE — Assessment & Plan Note (Addendum)
Chest x-ray today  Azithromycin 250mg  tablet  >>>Take 2 tablets (500mg  total) today, and then 1 tablet (250mg ) for the next four days  >>>take with food  >>>can also take probiotic and / or yogurt while on antibiotic   Prednisone 10mg  tablet  >>>4 tabs for 2 days, then 3 tabs for 2 days, 2 tabs for 2 days, then 1 tab for 2 days, then stop >>>take with food  >>>take in the morning   We will order a split-night sleep study  We will order pulmonary function test  Continue nocturnal oxygen  Stiolto Respimat inhaler >>>2 puffs daily >>>Take this no matter what >>>This is not a rescue inhaler  Receive flu vaccine next month at follow-up appointment  Note your daily symptoms > remember "red flags" for COPD:   >>>Increase in cough >>>increase in sputum production >>>increase in shortness of breath or activity  intolerance.   If you notice these symptoms, please call the office to be seen.

## 2018-06-09 NOTE — Progress Notes (Signed)
Your chest x-ray results of come back.  Showing no acute changes.  No plan of care changes at this time.  Keep follow-up appointment.    Follow-up with our office if symptoms worsen or you do not feel like you are improving under her current regimen.  It was a pleasure taking care of you,  Brian Mack, FNP 

## 2018-06-09 NOTE — Progress Notes (Signed)
@Patient  ID: Diane [name], female    DOB: September 22, 1956, 62 y.o.   MRN: [id number]  Chief Complaint  Patient presents with  . Acute Visit    sob    Referring provider: Tower, [name], MD  HPI:  62 year old female current smoker followed in our office for bronchitis  PMH: Combined CHF, CAD s/p CABG, CVA, DM, HLD, HTN, Fibromyalgia, GERD, Goiter, Pneumonia, Seizures Smoker/ Smoking History:  Maintenance: Bevespi-patient has not been taking this Pt of: Dr. Halford Chessman  Recent [facility] Encounters:   03/18/2017-office visit-Sood Patient is still smoking, working on quitting.  Patient has gained 15 pounds.  Using oxygen at night.  Feels more tired and sleepy. Plan: Continue Bevespi, 02 at night, emphasized smoking cessation follow-up in 6 months  06/09/2018  - Visit   62 year old patient presenting today for increased shortness of breath, yellow productive mucus, subjective fevers, chills.  Patient is an active smoker smoking 1 to 1/2 packs/day.  Patient has stopped her Bevespi she has not been using that for the last year.  Patient does report using oxygen at night sometimes.  Patient is concerned about her breathing status and feels that it is worsened over the last year.  Patient also reports that in the past she had been diagnosed with obstructive sleep apnea, but then had a sleep study that ruled out obstructive sleep apnea, but then now patient thinks that she may have sleep apnea again.  Last sleep study was in 2017.  Epworth's today is 9.   MMRC - Breathlessness Score 3 - I stop for breath after walking about 100 yards or after a few minutes on level ground (isle at grocery store is 154ft)  Tests:   PSG 04/10/06>> RDI 105  CPAP 06/13/11 to 07/10/11>>Used on 20 of 28 nights with median 3 hrs 45 min.  Average AHI 5 with median CPAP 7 cm H2O and 95th percentile CPAP 10 cm H2O. PSG 05/15/16 >> AHI 1.9, SpO2 low 83%  Pulmonary tests PFT 05/12/12>>FEV1 1.83 (98%), FEV1% 80, TLC  3.83 (98%), DLCO 61% (corrects for lung volumes), no BD response PFT 06/30/14 >> FEV1 1.80 (93%), FEV1% 86, TLC 4.35 (108%), DLCO 82%, no BD Spirometry 05/11/16 >> FEV1 1.74 (89%), FEV1% 76  ONO with RA 07/27/16 >> test time 6 hrs 25 min.  Mean SpO2 91.04%, low SpO2 68%.  Spent 18 min with SpO2 < 88%.  Cardiac tests Echo 04/25/15 >> EF 40%, grade 2 diastolic CHF, mod MR, PAS 45 mmHg   Chart Review:     Specialty Problems      Pulmonary Problems   Obstructive sleep apnea    PSG 04/10/06>> RDI 105  CPAP 06/13/11 to 07/10/11>>Used on 20 of 28 nights with median 3 hrs 45 min.  Average AHI 5 with median CPAP 7 cm H2O and 95th percentile CPAP 10 cm H2O. PSG 05/15/16 >> AHI 1.9, SpO2 low 83%       Allergic rhinitis   COPD with chronic bronchitis (HCC)    PFT 05/12/12>>FEV1 1.83 (98%), FEV1% 80, TLC 3.83 (98%), DLCO 61% (corrects for lung volumes), no BD response PFT 06/30/14 >> FEV1 1.80 (93%), FEV1% 86, TLC 4.35 (108%), DLCO 82%, no BD Spirometry 05/11/16 >> FEV1 1.74 (89%), FEV1% 76       Sleep-related hypoventilation due to lower airway obstruction   Chronic respiratory failure ([place])    ONO with RA 07/27/16 >> test time 6 hrs 25 min.  Mean SpO2 91.04%, low SpO2 68%.  Spent 18 min with SpO2 < 88%.      Smokers' cough (HCC)      Allergies  Allergen Reactions  . Bee Venom Itching and Swelling  . Entresto [Sacubitril-Valsartan] Cough  . Tetracycline     Unknown   . Ace Inhibitors Cough    Immunization History  Administered Date(s) Administered  . Influenza Whole 06/01/2012  . Influenza,inj,Quad PF,6+ Mos 06/16/2013, 06/16/2014, 06/21/2015, 08/13/2016, 10/11/2017  . Pneumococcal Conjugate-13 05/11/2016  . Pneumococcal Polysaccharide-23 11/18/1998, 11/04/2008, 06/16/2014  . Td 10/01/1996, 11/15/2009   >>> Needs flu vaccine  Past Medical History:  Diagnosis Date  . Anemia   . Carotid stenosis    a. s/p right carotid stent 10/2017.  [name] Chronic systolic CHF (congestive heart  failure) ([place])   . Colon polyps   . COPD (chronic obstructive pulmonary disease) ([place])   . Coronary artery disease    post CABG in 3/07 , coronary stents   . Diabetes mellitus    type 2  . Dyslipidemia   . Fibromyalgia   . GERD (gastroesophageal reflux disease)   . Headache    hx of   . Hyperlipidemia   . Hypertension   . Myocardial infarction ([place])   . Pneumonia    hx of   . Seizures ([place])    last seizure- 03/2013   . Shortness of breath dyspnea    with exertion or when fluid builds up   . Sleep apnea    used to wear a cpap- not used in 3 years   . Status post dilation of esophageal narrowing   . Stroke [facility]) 1993   problems with balance   . Systolic murmur    known mild AS and MR  . Thyroid goiter   . Tobacco abuse     Tobacco History: Social History   Tobacco Use  Smoking Status Current Every Day Smoker  . Packs/day: 1.50  . Years: 50.00  . Pack years: 75.00  . Types: Cigarettes  Smokeless Tobacco Never Used  Tobacco Comment   1.5 pack to 2 packs per day   Ready to quit: Yes Counseling given: Yes Comment: 1.5 pack to 2 packs per day  Smoking assessment and cessation counseling  Patient currently smoking: 1.5 packs/day I have advised the patient to quit/stop smoking as soon as possible due to high risk for multiple medical problems.  It will also be very difficult for [place] to manage patient's  respiratory symptoms and status if we continue to expose her lungs to a known irritant.  We do not advise e-cigarettes as a form of stopping smoking.  Patient is willing to quit smoking.  I have advised the patient that we can assist and have options of nicotine replacement therapy, provided smoking cessation education today, provided smoking cessation counseling, and provided cessation resources.  Patient has tried multiple therapies to stop smoking.  We will have patient use reduced to quit method.  Patient I discussed progressively decreasing her cigarette consumption.   At 4-week follow-up appointment patient to be down to 1 pack a day.  Patient agrees that this is logical.  Patient agrees to try this.  Follow-up next office visit office visit for assessment of smoking cessation.  Smoking cessation counseling advised for: 5 min    We recommend that you stop smoking.   Smoking Cessation Resources:  1 800 QUIT NOW  >>> Patient to call this resource and utilize it to help support her quit smoking >>> Keep up your hard  work with stopping smoking  You can also contact the [facility] >>>For smoking cessation classes call [phone]  We do not recommend using e-cigarettes as a form of stopping smoking  We will also refer patient to lung cancer screening program based off of 75-pack-year smoking history.  Outpatient Encounter Medications as of 06/09/2018  Medication Sig  . acetaminophen (TYLENOL) 500 MG tablet Take 1,000 mg by mouth every 6 (six) hours as needed for moderate pain or headache.  . albuterol (VENTOLIN HFA) 108 (90 Base) MCG/ACT inhaler INHALER 2 PUFFS EVERY 6 HOURS AS NEEDED FOR WHEEZING/SHORTNESS OF BREATH  . aspirin EC 81 MG tablet Take 81 mg by mouth every evening.   . carvedilol (COREG) 25 MG tablet TAKE 1 TABLET (25 MG TOTAL) BY MOUTH 2 (TWO) TIMES DAILY WITH A MEAL.  [name] clopidogrel (PLAVIX) 75 MG tablet TAKE 1 TABLET BY MOUTH EVERY DAY  . dexlansoprazole (DEXILANT) 60 MG capsule TAKE 1 CAPSULE (60 MG TOTAL) BY MOUTH DAILY.  . diphenhydrAMINE (BENADRYL) 25 mg capsule Take 2 capsules in AM and 1 capsule at night (Patient taking differently: Take 50 mg by mouth 2 (two) times daily as needed for itching or allergies. )  . fluticasone (FLONASE) 50 MCG/ACT nasal spray Place 2 sprays into both nostrils daily.  . furosemide (LASIX) 40 MG tablet TAKE 1 TABLET BY MOUTH EVERY DAY  . gabapentin (NEURONTIN) 300 MG capsule TAKE 2 CAPSULES BY MOUTH AT BEDTIME  . glimepiride (AMARYL) 2 MG tablet Take 2 mg by mouth 2 (two) times daily.  [name]  guaiFENesin (MUCINEX) 600 MG 12 hr tablet Take 2 tablets (1,200 mg total) by mouth 2 (two) times daily.  . hydrochlorothiazide (HYDRODIURIL) 50 MG tablet Take 25 mg by mouth daily.  . isosorbide mononitrate (IMDUR) 30 MG 24 hr tablet Take 30 mg by mouth daily.  [name] KLOR-CON M10 10 MEQ tablet TAKE 1 TABLET BY MOUTH TWICE A DAY  . levETIRAcetam (KEPPRA) 1000 MG tablet TAKE 1 TABLET BY MOUTH TWICE A DAY  . losartan (COZAAR) 100 MG tablet Take 0.5 tablets (50 mg total) by mouth daily.  . metFORMIN (GLUCOPHAGE-XR) 500 MG 24 hr tablet Take 500 mg by mouth 2 (two) times daily.  . nitroGLYCERIN (NITROLINGUAL) 0.4 MG/SPRAY spray PLACE 1 SPRAY UNDER THE TONGUE EVERY 5 (FIVE) MINUTES X 3 DOSES AS NEEDED FOR CHEST PAIN.  . ONE TOUCH ULTRA TEST test strip USE AS DIRECTED FOR TESTING BLOOD GLUCOSE 3 TIMES DAILY  . polyethylene glycol powder (GLYCOLAX/MIRALAX) powder TAKE 1 DOSE 17 GRAMS IN 8 OZ OF WATER DAILY AS NEEDED FOR CONSTIPATION.  . RANEXA 1000 MG SR tablet TAKE 1 TABLET BY MOUTH TWICE A DAY  . rosuvastatin (CRESTOR) 10 MG tablet TAKE 1 TABLET BY MOUTH DAILY  . sodium chloride (OCEAN) 0.65 % SOLN nasal spray Place 1 spray into both nostrils as needed for congestion.  [name] azithromycin (ZITHROMAX) 250 MG tablet 500mg  (two tablets) today, then 250mg  (1 tablet) for the next 4 days  . cetirizine (ZYRTEC) 10 MG tablet Take 10 mg by mouth daily as needed for allergies.  [name] oxyCODONE (OXY IR/ROXICODONE) 5 MG immediate release tablet Take 1-2 tablets (5-10 mg total) by mouth every 4 (four) hours as needed for moderate pain. (Patient not taking: Reported on 06/09/2018)  . predniSONE (DELTASONE) 10 MG tablet 4 tabs for 2 days, then 3 tabs for 2 days, 2 tabs for 2 days, then 1 tab for 2 days, then stop   No facility-administered  encounter medications on file as of 06/09/2018.      Review of Systems  Review of Systems  Constitutional: Positive for fatigue. Negative for chills, fever and unexpected weight change.  HENT:  Positive for congestion. Negative for ear pain, postnasal drip, sinus pressure and sinus pain.   Respiratory: Positive for cough (Yellow mucus) and shortness of breath. Negative for chest tightness and wheezing.   Cardiovascular: Negative for chest pain and palpitations.  Gastrointestinal: Negative for blood in stool, diarrhea, nausea and vomiting.  Genitourinary: Negative for dysuria, frequency and urgency.  Musculoskeletal: Negative for arthralgias.  Skin: Negative for color change.  Allergic/Immunologic: Negative for environmental allergies and food allergies.  Neurological: Negative for dizziness, light-headedness and headaches.  Psychiatric/Behavioral: Negative for dysphoric mood. The patient is not nervous/anxious.   All other systems reviewed and are negative.    Physical Exam  BP 126/70 (BP Location: Left Arm, Patient Position: Sitting, Cuff Size: Normal)   Pulse 81   Temp (!) 97.3 F (36.3 C)   Ht 4' 10.27" (1.48 m)   Wt 138 lb (62.6 kg)   LMP 09/02/2003   SpO2 94%   BMI 28.58 kg/m   Wt Readings from Last 5 Encounters:  06/09/18 138 lb (62.6 kg)  05/30/18 138 lb 3.2 oz (62.7 kg)  04/28/18 139 lb 2 oz (63.1 kg)  04/21/18 139 lb (63 kg)  04/14/18 140 lb (63.5 kg)    Physical Exam  Constitutional: She is oriented to person, place, and time and well-developed, well-nourished, and in no distress. No distress.  HENT:  Head: Normocephalic and atraumatic.  Right Ear: Hearing, tympanic membrane, external ear and ear canal normal.  Left Ear: Hearing, tympanic membrane, external ear and ear canal normal.  Nose: Nose normal. Right sinus exhibits no maxillary sinus tenderness and no frontal sinus tenderness. Left sinus exhibits no maxillary sinus tenderness and no frontal sinus tenderness.  Mouth/Throat: Uvula is midline and oropharynx is clear and moist. No oropharyngeal exudate.  Multiple missing teeth  Eyes: Pupils are equal, round, and reactive to light.  Neck: Normal  range of motion. Neck supple. No JVD present.  Cardiovascular: Normal rate, regular rhythm and normal heart sounds.  Pulmonary/Chest: Effort normal. No accessory muscle usage. No respiratory distress. She has no decreased breath sounds. She has wheezes. She has no rhonchi.  Abdominal: Soft. Bowel sounds are normal. There is no tenderness.  Musculoskeletal: Normal range of motion. She exhibits no edema.  Lymphadenopathy:    She has no cervical adenopathy.  Neurological: She is alert and oriented to person, place, and time. Gait normal.  Skin: Skin is warm and dry. She is not diaphoretic. No erythema.  Psychiatric: Mood, memory, affect and judgment normal.  Nursing note and vitals reviewed.    Lab Results:  CBC    Component Value Date/Time   WBC 9.0 03/03/2018 1437   RBC 4.89 03/03/2018 1437   HGB 12.6 03/03/2018 1437   HCT 38.4 03/03/2018 1437   PLT 249.0 03/03/2018 1437   MCV 78.5 03/03/2018 1437   MCH 26.3 10/10/2017 1911   MCHC 32.9 03/03/2018 1437   RDW 16.6 (H) 03/03/2018 1437   LYMPHSABS 1.4 03/03/2018 1437   MONOABS 0.6 03/03/2018 1437   EOSABS 0.0 03/03/2018 1437   BASOSABS 0.0 03/03/2018 1437    BMET    Component Value Date/Time   NA 135 03/03/2018 1437   NA 135 (L) 02/15/2017 1457   K 4.7 03/03/2018 1437   K 4.8 02/15/2017 1457  CL 98 03/03/2018 1437   CO2 30 03/03/2018 1437   CO2 26 02/15/2017 1457   GLUCOSE 222 (H) 03/03/2018 1437   GLUCOSE 98 02/15/2017 1457   GLUCOSE 251 (H) 10/10/2006 1254   BUN 21 03/03/2018 1437   BUN 10.8 02/15/2017 1457   CREATININE 0.70 03/03/2018 1437   CREATININE 0.69 05/06/2017 1405   CREATININE 0.7 02/15/2017 1457   CALCIUM 9.6 03/03/2018 1437   CALCIUM 9.3 02/15/2017 1457   GFRNONAA >60 10/10/2017 1911   GFRNONAA 88 03/22/2015 1213   GFRAA >60 10/10/2017 1911   GFRAA >89 03/22/2015 1213    BNP No results found for: BNP  ProBNP No results found for: PROBNP  Imaging: Dg Chest 2 View  Result Date:  06/09/2018 CLINICAL DATA:  Productive cough, chest tightness, and shortness of breath for the past 1-2 weeks. EXAM: CHEST - 2 VIEW COMPARISON:  Chest x-ray dated September 04, 2016. FINDINGS: Stable borderline cardiomegaly status post CABG. Normal pulmonary vascularity. Atherosclerotic calcification of the aortic arch. Mildly coarsened interstitial lung markings are similar to prior studies. Mild central peribronchial thickening. No focal consolidation, pleural effusion, or pneumothorax. No acute osseous abnormality. IMPRESSION: 1. Bronchitic changes. Electronically Signed   By: Titus Dubin M.D.   On: 06/09/2018 16:23      Assessment & Plan:   Pleasant 62 year old patient seen today for follow-up visit.  Will order split-night sleep study as well as pulmonary function test.  Patient to be treated with Z-Pak, prednisone taper, chest x-ray.  We will also refer patient to lung cancer screening program.  Emphasized the importance of patient stopping smoking.  Patient to use reduced to quit method and to be down to 1 pack of cigarettes daily at next office visit in 4 weeks.  We will have patient resume controller therapy of Stiolto Respimat.  Obstructive sleep apnea Split-night sleep study to be ordered Continue nocturnal oxygen at this time   COPD with chronic bronchitis (HCC) Chest x-ray today  Azithromycin 250mg  tablet  >>>Take 2 tablets (500mg  total) today, and then 1 tablet (250mg ) for the next four days  >>>take with food  >>>can also take probiotic and / or yogurt while on antibiotic   Prednisone 10mg  tablet  >>>4 tabs for 2 days, then 3 tabs for 2 days, 2 tabs for 2 days, then 1 tab for 2 days, then stop >>>take with food  >>>take in the morning   We will order a split-night sleep study  We will order pulmonary function test  Continue nocturnal oxygen  Stiolto Respimat inhaler >>>2 puffs daily >>>Take this no matter what >>>This is not a rescue inhaler  Receive flu vaccine  next month at follow-up appointment  Note your daily symptoms > remember "red flags" for COPD:   >>>Increase in cough >>>increase in sputum production >>>increase in shortness of breath or activity  intolerance.   If you notice these symptoms, please call the office to be seen.     Chronic respiratory failure (HCC) Continue nocturnal oxygen  TOBACCO USE We recommend that you stop smoking.   Smoking Cessation Resources:  1 800 QUIT NOW  >>> Patient to call this resource and utilize it to help support her quit smoking >>> Keep up your hard work with stopping smoking  You can also contact the [facility] >>>For smoking cessation classes call [phone]  We do not recommend using e-cigarettes as a form of stopping smoking     We will refer you today to her  lung cancer screening program >>>This is based off of your 75 pack-year smoking history >>> This is a recommendation from the [place] preventative services task force (USPSTF) >>>The USPSTF recommends annual screening for lung cancer with low-dose computed tomography (LDCT) in adults aged 2 to 58 years who have a 30 pack-year smoking history and currently smoke or have quit within the past 15 years. Screening should be discontinued once a person has not smoked for 15 years or develops a health problem that substantially limits life expectancy or the ability or willingness to have curative lung surgery.   Our office will call you and set up an appointment with Eric Form (Nurse Practitioner) who leads this program.  This appointment takes place in our office.  After completing this meeting with Eric Form NP you will get a low-dose CT as the screening >>>We will call you with those results       Lauraine Rinne, NP 06/09/2018

## 2018-06-09 NOTE — Patient Instructions (Addendum)
Chest x-ray today  Azithromycin 250mg  tablet  >>>Take 2 tablets (500mg  total) today, and then 1 tablet (250mg ) for the next four days  >>>take with food  >>>can also take probiotic and / or yogurt while on antibiotic   Prednisone 10mg  tablet  >>>4 tabs for 2 days, then 3 tabs for 2 days, 2 tabs for 2 days, then 1 tab for 2 days, then stop >>>take with food  >>>take in the morning    We will order a split-night sleep study  Continue nocturnal oxygen  Stiolto Respimat inhaler >>>2 puffs daily >>>Take this no matter what >>>This is not a rescue inhaler  Receive flu vaccine next month at follow-up appointment      Note your daily symptoms > remember "red flags" for COPD:   >>>Increase in cough >>>increase in sputum production >>>increase in shortness of breath or activity  intolerance.   If you notice these symptoms, please call the office to be seen.       We recommend that you stop smoking.   Smoking Cessation Resources:  1 800 QUIT NOW  >>> Patient to call this resource and utilize it to help support her quit smoking >>> Keep up your hard work with stopping smoking  You can also contact the [facility] >>>For smoking cessation classes call [phone]  We do not recommend using e-cigarettes as a form of stopping smoking   It is flu season:   >>>Remember to be washing your hands regularly, using hand sanitizer, be careful to use around herself with has contact with people who are sick will increase her chances of getting sick yourself. >>> Best ways to protect herself from the flu: Receive the yearly flu vaccine, practice good hand hygiene washing with soap and also using hand sanitizer when available, eat a nutritious meals, get adequate rest, hydrate appropriately   Please contact the office if your symptoms worsen or you have concerns that you are not improving.   Thank you for choosing [facility] for your healthcare, and for  allowing [place] to partner with you on your healthcare journey. I am thankful to be able to provide care to you today.   Wyn Quaker FNP-C    Home Oxygen Use, Adult When a medical condition keeps you from getting enough oxygen, your health care provider may instruct you to take extra oxygen at home. Your health care provider will let you know:  When to take oxygen.  For how long to take oxygen.  How quickly oxygen should be delivered (flow rate), in liters per minute (LPM or L/M).  Home oxygen can be given through:  A mask.  A nasal cannula. This is a device or tube that goes in the nostrils.  A transtracheal catheter. This is a small, flexible tube placed in the trachea.  A tracheostomy. This is a surgically made opening in the trachea.  These devices are connected with tubing to an oxygen source, such as:  A tank. Tanks hold oxygen in gas form. They must be replaced when the oxygen is used up.  A liquid oxygen device. This holds oxygen in liquid form. It must be replaced when the oxygen is used up.  An oxygen concentrator machine. This filters oxygen in the room. It uses electricity, so you must have a backup cylinder of oxygen in case the power goes out.  Supplies needed: To use oxygen, you will need:  A mask, nasal cannula, transtracheal catheter, or tracheostomy.  An oxygen  tank, a liquid oxygen device, or an oxygen concentrator.  The tape that your health care provider recommends (optional).  If you use a transtracheal catheter and your prescribed flow rate is 1 LPM or greater, you will also need a humidifier. Risks and complications  Fire. This can happen if the oxygen is exposed to a heat source, flame, or spark.  Injury to skin. This can happen if liquid oxygen touches your skin.  Organ damage. This can happen if you get too little oxygen. How to use oxygen Your health care provider will show you how to use your oxygen device. Follow her or his instructions.  They may look something like this: 1. Wash your hands. 2. If you use an oxygen concentrator, make sure it is plugged in. 3. Place one end of the tube into the port on the tank, device, or machine. 4. Place the mask over your nose and mouth. Or, place the nasal cannula and secure it with tape if instructed. If you use a tracheostomy or transtracheal catheter, connect it to the oxygen source as directed. 5. Make sure the liter-flow setting on the machine is at the level prescribed by your health care provider. 6. Turn on the machine or adjust the knob on the tank or device to the correct liter-flow setting. 7. When you are done, turn off and unplug the machine, or turn the knob to OFF.  How to clean and care for the oxygen supplies Nasal cannula  Clean it with a warm, wet cloth daily or as needed.  Wash it with a liquid soap once a week.  Rinse it thoroughly once or twice a week.  Replace it every 2-4 weeks.  If you have an infection, such as a cold or pneumonia, change the cannula when you get better. Mask  Replace it every 2-4 weeks.  If you have an infection, such as a cold or pneumonia, change the mask when you get better. Humidifier bottle  Wash the bottle between each refill: ? Wash it with soap and warm water. ? Rinse it thoroughly. ? Disinfect it and its top. ? Air-dry it.  Make sure it is dry before you refill it. Oxygen concentrator  Clean the air filter at least twice a week according to directions from your home medical equipment and service company.  Wipe down the cabinet every day. To do this: ? Unplug the unit. ? Wipe down the cabinet with a damp cloth. ? Dry the cabinet. Other equipment  Change any extra tubing every 1-3 months.  Follow instructions from your health care provider about taking care of any other equipment. Safety tips Fire safety tips   Keep your oxygen and oxygen supplies at least 5 ft away from sources of heat, flames, and sparks at  all times.  Do not allow smoking near your oxygen. Put up "no smoking" signs in your home.  Do not use materials that can burn (are flammable) while you use oxygen.  When you go to a restaurant with portable oxygen, ask to be seated in the nonsmoking section.  Keep a Data processing manager close by. Let your fire department know that you have oxygen in your home.  Test your home smoke detectors regularly. General safety tips  If you use an oxygen cylinder, make sure it is in a stand or secured to an object that will not move (fixed object).  If you use liquid oxygen, make sure its container is kept upright.  If you use an oxygen  concentrator: ? Consulting civil engineer. Make sure you are given priority service in the event that your power goes out. ? Avoid using extension cords, if possible. Follow these instructions at home:  Use oxygen only as told by your health care provider.  Do not use alcohol or other drugs that make you relax (sedating drugs) unless instructed. They can slow down your breathing rate and make it hard to get in enough oxygen.  Know how and when to order a refill of oxygen.  Always keep a spare tank of oxygen. Plan ahead for holidays when you may not be able to get a prescription filled.  Use water-based lubricants on your lips or nostrils. Do not use oil-based products like petroleum jelly.  To prevent skin irritation on your cheeks or behind your ears, tuck some gauze under the tubing. Contact a health care provider if:  You get headaches often.  You have shortness of breath.  You have a lasting cough.  You have anxiety.  You are sleepy all the time.  You develop an illness that affects your breathing.  You cannot exercise at your regular level.  You are restless.  You have difficult or irregular breathing, and it is getting worse.  You have a fever.  You have persistent redness under your nose. Get help right away if:  You are  confused.  You have blue lips or fingernails.  You are struggling to breathe. This information is not intended to replace advice given to you by your health care provider. Make sure you discuss any questions you have with your health care provider. Document Released: 12/08/2003 Document Revised: 05/16/2016 Document Reviewed: 04/10/2016 Elsevier Interactive Patient Education  2018 [place] with Quitting Smoking Quitting smoking is a physical and mental challenge. You will face cravings, withdrawal symptoms, and temptation. Before quitting, work with your health care provider to make a plan that can help you cope. Preparation can help you quit and keep you from giving in. How can I cope with cravings? Cravings usually last for 5-10 minutes. If you get through it, the craving will pass. Consider taking the following actions to help you cope with cravings:  Keep your mouth busy: ? Chew sugar-free gum. ? Suck on hard candies or a straw. ? Brush your teeth.  Keep your hands and body busy: ? Immediately change to a different activity when you feel a craving. ? Squeeze or play with a ball. ? Do an activity or a hobby, like making bead jewelry, practicing needlepoint, or working with wood. ? Mix up your normal routine. ? Take a short exercise break. Go for a quick walk or run up and down stairs. ? Spend time in public places where smoking is not allowed.  Focus on doing something kind or helpful for someone else.  Call a friend or family member to talk during a craving.  Join a support group.  Call a quit line, such as 1-800-QUIT-NOW.  Talk with your health care provider about medicines that might help you cope with cravings and make quitting easier for you.  How can I deal with withdrawal symptoms? Your body may experience negative effects as it tries to get used to not having nicotine in the system. These effects are called withdrawal symptoms. They may  include:  Feeling hungrier than normal.  Trouble concentrating.  Irritability.  Trouble sleeping.  Feeling depressed.  Restlessness and agitation.  Craving a cigarette.  To manage withdrawal symptoms:  Avoid places, people, and activities that trigger your cravings.  Remember why you want to quit.  Get plenty of sleep.  Avoid coffee and other caffeinated drinks. These may worsen some of your symptoms.  How can I handle social situations? Social situations can be difficult when you are quitting smoking, especially in the first few weeks. To manage this, you can:  Avoid parties, bars, and other social situations where people might be smoking.  Avoid alcohol.  Leave right away if you have the urge to smoke.  Explain to your family and friends that you are quitting smoking. Ask for understanding and support.  Plan activities with friends or family where smoking is not an option.  What are some ways I can cope with stress? Wanting to smoke may cause stress, and stress can make you want to smoke. Find ways to manage your stress. Relaxation techniques can help. For example:  Breathe slowly and deeply, in through your nose and out through your mouth.  Listen to soothing, relaxing music.  Talk with a family member or friend about your stress.  Light a candle.  Soak in a bath or take a shower.  Think about a peaceful place.  What are some ways I can prevent weight gain? Be aware that many people gain weight after they quit smoking. However, not everyone does. To keep from gaining weight, have a plan in place before you quit and stick to the plan after you quit. Your plan should include:  Having healthy snacks. When you have a craving, it may help to: ? Eat plain popcorn, crunchy carrots, celery, or other cut vegetables. ? Chew sugar-free gum.  Changing how you eat: ? Eat small portion sizes at meals. ? Eat 4-6 small meals throughout the day instead of 1-2 large  meals a day. ? Be mindful when you eat. Do not watch television or do other things that might distract you as you eat.  Exercising regularly: ? Make time to exercise each day. If you do not have time for a long workout, do short bouts of exercise for 5-10 minutes several times a day. ? Do some form of strengthening exercise, like weight lifting, and some form of aerobic exercise, like running or swimming.  Drinking plenty of water or other low-calorie or no-calorie drinks. Drink 6-8 glasses of water daily, or as much as instructed by your health care provider.  Summary  Quitting smoking is a physical and mental challenge. You will face cravings, withdrawal symptoms, and temptation to smoke again. Preparation can help you as you go through these challenges.  You can cope with cravings by keeping your mouth busy (such as by chewing gum), keeping your body and hands busy, and making calls to family, friends, or a helpline for people who want to quit smoking.  You can cope with withdrawal symptoms by avoiding places where people smoke, avoiding drinks with caffeine, and getting plenty of rest.  Ask your health care provider about the different ways to prevent weight gain, avoid stress, and handle social situations. This information is not intended to replace advice given to you by your health care provider. Make sure you discuss any questions you have with your health care provider. Document Released: 09/14/2016 Document Revised: 09/14/2016 Document Reviewed: 09/14/2016 Elsevier Interactive Patient Education  [employer].

## 2018-06-09 NOTE — Assessment & Plan Note (Addendum)
We recommend that you stop smoking.   Smoking Cessation Resources:  1 800 QUIT NOW  >>> Patient to call this resource and utilize it to help support her quit smoking >>> Keep up your hard work with stopping smoking  You can also contact the [facility] >>>For smoking cessation classes call [phone]  We do not recommend using e-cigarettes as a form of stopping smoking     We will refer you today to her lung cancer screening program >>>This is based off of your 75 pack-year smoking history >>> This is a recommendation from the [place] preventative services task force (USPSTF) >>>The USPSTF recommends annual screening for lung cancer with low-dose computed tomography (LDCT) in adults aged 11 to 80 years who have a 30 pack-year smoking history and currently smoke or have quit within the past 15 years. Screening should be discontinued once a person has not smoked for 15 years or develops a health problem that substantially limits life expectancy or the ability or willingness to have curative lung surgery.   Our office will call you and set up an appointment with Eric Form (Nurse Practitioner) who leads this program.  This appointment takes place in our office.  After completing this meeting with Eric Form NP you will get a low-dose CT as the screening >>>We will call you with those results

## 2018-06-11 ENCOUNTER — Other Ambulatory Visit: Payer: Self-pay | Admitting: Cardiology

## 2018-06-11 NOTE — Telephone Encounter (Signed)
Rx request sent to pharmacy.  

## 2018-06-12 ENCOUNTER — Other Ambulatory Visit: Payer: Self-pay | Admitting: Pulmonary Disease

## 2018-06-12 ENCOUNTER — Other Ambulatory Visit: Payer: Self-pay | Admitting: Acute Care

## 2018-06-12 DIAGNOSIS — F17210 Nicotine dependence, cigarettes, uncomplicated: Secondary | ICD-10-CM

## 2018-06-12 DIAGNOSIS — Z122 Encounter for screening for malignant neoplasm of respiratory organs: Secondary | ICD-10-CM

## 2018-06-12 MED ORDER — TIOTROPIUM BROMIDE-OLODATEROL 2.5-2.5 MCG/ACT IN AERS: 2.0000 | INHALATION_SPRAY | Freq: Every day | RESPIRATORY_TRACT | 0 refills | Status: DC

## 2018-06-13 NOTE — Telephone Encounter (Signed)
Called pharmacy for clarification: Stiolto was sent in on 9/9 as a new start rx, but is not covered by insurance.  No covered alternatives are listed, but Anoro was suggested by pharmacy. Aaron Edelman please advise if you'd like to initiate PA process for Stiolto, or try sending in another medication.  Thanks!

## 2018-06-13 NOTE — Telephone Encounter (Signed)
I am okay with patient being on Anoro Ellipta.  If patient is willing to try it.    If patient refuses then we can start PA process for Stiolto Respimat.  Overall, my largest concern is to make sure the patient is on a LAMA / LABA daily.   Wyn Quaker FNP

## 2018-06-25 ENCOUNTER — Encounter: Payer: Self-pay | Admitting: Acute Care

## 2018-06-25 ENCOUNTER — Ambulatory Visit (INDEPENDENT_AMBULATORY_CARE_PROVIDER_SITE_OTHER)
Admission: RE | Admit: 2018-06-25 | Discharge: 2018-06-25 | Disposition: A | Discharge status: Home / Self Care | Payer: Medicare Other | Source: Ambulatory Visit | Attending: Acute Care | Admitting: Acute Care

## 2018-06-25 ENCOUNTER — Ambulatory Visit (INDEPENDENT_AMBULATORY_CARE_PROVIDER_SITE_OTHER): Payer: Medicare Other | Admitting: Acute Care

## 2018-06-25 DIAGNOSIS — Z122 Encounter for screening for malignant neoplasm of respiratory organs: Secondary | ICD-10-CM

## 2018-06-25 DIAGNOSIS — F17210 Nicotine dependence, cigarettes, uncomplicated: Secondary | ICD-10-CM

## 2018-06-25 MED ORDER — TIOTROPIUM BROMIDE-OLODATEROL 2.5-2.5 MCG/ACT IN AERS: 2.0000 | INHALATION_SPRAY | Freq: Every day | RESPIRATORY_TRACT | 0 refills | Status: DC

## 2018-06-25 MED ORDER — TIOTROPIUM BROMIDE-OLODATEROL 2.5-2.5 MCG/ACT IN AERS: 2.0000 | INHALATION_SPRAY | Freq: Every day | RESPIRATORY_TRACT | 4 refills | Status: DC

## 2018-06-25 NOTE — Progress Notes (Signed)
Shared Decision Making Visit Lung Cancer Screening Program [phone])   Eligibility:  Age 62 y.o.  Pack Years Smoking History Calculation 76 pack year smoking history (# packs/per year x # years smoked)  Recent History of coughing up blood  no  Unexplained weight loss? no ( >Than 15 pounds within the last 6 months )  Prior History Lung / other cancer no (Diagnosis within the last 5 years already requiring surveillance chest CT Scans).  Smoking Status Current Smoker  Former Smokers: Years since quit: NA  Quit Date: NA  Visit Components:  Discussion included one or more decision making aids. yes  Discussion included risk/benefits of screening. yes  Discussion included potential follow up diagnostic testing for abnormal scans. yes  Discussion included meaning and risk of over diagnosis. yes  Discussion included meaning and risk of False Positives. yes  Discussion included meaning of total radiation exposure. yes  Counseling Included:  Importance of adherence to annual lung cancer LDCT screening. yes  Impact of comorbidities on ability to participate in the program. yes  Ability and willingness to under diagnostic treatment. yes  Smoking Cessation Counseling:  Current Smokers:   Discussed importance of smoking cessation. yes  Information about tobacco cessation classes and interventions provided to patient. yes  Patient provided with "ticket" for LDCT Scan. yes  Symptomatic Patient. no  Counseling  Diagnosis Code: Tobacco Use Z72.0  Asymptomatic Patient yes  Counseling (Intermediate counseling: > three minutes counseling) O3500  Former Smokers:   Discussed the importance of maintaining cigarette abstinence. yes  Diagnosis Code: Personal History of Nicotine Dependence. X38.182  Information about tobacco cessation classes and interventions provided to patient. Yes  Patient provided with "ticket" for LDCT Scan. yes  Written Order for Lung Cancer  Screening with LDCT placed in Epic. Yes (CT Chest Lung Cancer Screening Low Dose W/O CM) [id number] Z12.2-Screening of respiratory organs Z87.891-Personal history of nicotine dependence   I have spent 25 minutes of face to face time with Ms. Borys discussing the risks and benefits of lung cancer screening. We viewed a power point together that explained in detail the above noted topics. We paused at intervals to allow for questions to be asked and answered to ensure understanding.We discussed that the single most powerful action that she can take to decrease her risk of developing lung cancer is to quit smoking. We discussed whether or not she is ready to commit to setting a quit date. We discussed options for tools to aid in quitting smoking including nicotine replacement therapy, non-nicotine medications, support groups, Quit Smart classes, and behavior modification. We discussed that often times setting smaller, more achievable goals, such as eliminating 1 cigarette a day for a week and then 2 cigarettes a day for a week can be helpful in slowly decreasing the number of cigarettes smoked. This allows for a sense of accomplishment as well as providing a clinical benefit. I gave her the " Be Stronger Than Your Excuses" card with contact information for community resources, classes, free nicotine replacement therapy, and access to mobile apps, text messaging, and on-line smoking cessation help. I have also given her my card and contact information in the event she needs to contact me. We discussed the time and location of the scan, and that either Doroteo Glassman RN or I will call with the results within 24-48 hours of receiving them. I have offered her  a copy of the power point we viewed  as a resource in the event they need reinforcement  of the concepts we discussed today in the office. The patient verbalized understanding of all of  the above and had no further questions upon leaving the office. They have my  contact information in the event they have any further questions.  I spent 5 minutes counseling on smoking cessation and the health risks of continued tobacco abuse.  I explained to the patient that there has been a high incidence of coronary artery disease noted on these exams. I explained that this is a non-gated exam therefore degree or severity cannot be determined. This patient is currently on statin therapy. I have asked the patient to follow-up with their PCP regarding any incidental finding of coronary artery disease and management with diet or medication as their PCP  feels is clinically indicated. The patient verbalized understanding of the above and had no further questions upon completion of the visit.      Magdalen Spatz, NP 06/25/2018 1:21 PM

## 2018-07-02 ENCOUNTER — Telehealth: Payer: Self-pay | Admitting: Acute Care

## 2018-07-02 DIAGNOSIS — Z122 Encounter for screening for malignant neoplasm of respiratory organs: Secondary | ICD-10-CM

## 2018-07-02 DIAGNOSIS — F17210 Nicotine dependence, cigarettes, uncomplicated: Secondary | ICD-10-CM

## 2018-07-03 ENCOUNTER — Ambulatory Visit
Admission: RE | Admit: 2018-07-03 | Discharge: 2018-07-03 | Disposition: A | Discharge status: Home / Self Care | Payer: Medicare Other | Source: Ambulatory Visit | Attending: Family Medicine | Admitting: Family Medicine

## 2018-07-03 DIAGNOSIS — Z1231 Encounter for screening mammogram for malignant neoplasm of breast: Secondary | ICD-10-CM | POA: Diagnosis not present

## 2018-07-03 DIAGNOSIS — E2839 Other primary ovarian failure: Secondary | ICD-10-CM

## 2018-07-03 DIAGNOSIS — M85851 Other specified disorders of bone density and structure, right thigh: Secondary | ICD-10-CM | POA: Diagnosis not present

## 2018-07-03 DIAGNOSIS — Z780 Asymptomatic menopausal state: Secondary | ICD-10-CM | POA: Diagnosis not present

## 2018-07-03 NOTE — Telephone Encounter (Signed)
Attempted to call pt but no answer. Left message for pt to return call. Stated in the message for pt to ask for Diane [name] as she was the one who had called her to give her the results of the Low Dose CT.  Routing message to [place] for her to follow up on.

## 2018-07-04 NOTE — Telephone Encounter (Signed)
Pt informed of CT results per Sarah Groce, NP.  PT verbalized understanding.  Copy sent to PCP.  Order placed for 1 yr f/u CT.  

## 2018-07-04 NOTE — Telephone Encounter (Signed)
LMTC x 1  

## 2018-07-07 ENCOUNTER — Encounter (HOSPITAL_BASED_OUTPATIENT_CLINIC_OR_DEPARTMENT_OTHER): Payer: Self-pay

## 2018-07-14 NOTE — Progress Notes (Signed)
@Patient  ID: Diane [name], female    DOB: 1956-05-01, 62 y.o.   MRN: [id number]  Chief Complaint  Patient presents with  . Follow-up    PFT completed today, weakness, increased cough, wants flu shot today    Referring provider: Tower, [name], MD  HPI:  62 year old female current smoker followed in our office for bronchitis  PMH: Combined CHF, CAD s/p CABG, CVA, DM, HLD, HTN, Fibromyalgia, GERD, Goiter, Pneumonia, Seizures Smoker/ Smoking History: Current smoker. 1ppd. 70+ pack years.  Maintenance: Bevespi-patient has not been taking this Pt of: Dr. Halford Chessman   07/15/2018  - Visit   62 year old female patient presenting today for follow-up visit.  Patient also completed pulmonary function test today.  PFT today showing: 07/15/2018-pulmonary function test- FVC 2.150 (94% predicted), ratio 85, FEV1 105, no bronchodilator response, DLCO 81 normal flow volume loops  Patient reports that she still feels that she is not back to her baseline as far as for energy and still has fatigue.  Patient denies cough or sputum.  Patient denies fever.  Patient is actively working to stop smoking.  Patient is down to 1 pack/day.  This is an improvement from 2 packs/day which is what she was smoking.  Patient still has not set a quit date.  Patient reports that she has exercise 3-4 times a week where she is exercising by walking around her neighborhood or her house.  She is with her friend Jermika who also has COPD is also working on stopping smoking.  Patient reporting today that Stiolto is not covered by her insurance.  And will cost $400 a month prior to be on to receive it.  Patient is requesting a different medication.  Patient had similar issues with Bevespi.  Since last office visit patient has also completed follow-up with her lung cancer screening program.  Patient has completed low-dose CT which showed lung RADS 2, pt will have a repeat CT in 12 months.  Patient has not completed split-night sleep  study as patient does not feel like this is necessary anymore.    Tests:  PSG 04/10/06>> RDI 105  CPAP 06/13/11 to 07/10/11>>Used on 20 of 28 nights with median 3 hrs 45 min.  Average AHI 5 with median CPAP 7 cm H2O and 95th percentile CPAP 10 cm H2O. PSG 05/15/16 >> AHI 1.9, SpO2 low 83%  Pulmonary tests PFT 05/12/12>>FEV1 1.83 (98%), FEV1% 80, TLC 3.83 (98%), DLCO 61% (corrects for lung volumes), no BD response PFT 06/30/14 >> FEV1 1.80 (93%), FEV1% 86, TLC 4.35 (108%), DLCO 82%, no BD Spirometry 05/11/16 >> FEV1 1.74 (89%), FEV1% 76  ONO with RA 07/27/16 >> test time 6 hrs 25 min.  Mean SpO2 91.04%, low SpO2 68%.  Spent 18 min with SpO2 < 88%.  Cardiac tests Echo 04/25/15 >> EF 23%, grade 2 diastolic CHF, mod MR, PAS 45 mmHg   FENO:  No results found for: NITRICOXIDE  PFT: PFT Results Latest Ref Rng & Units 07/15/2018 06/30/2014  FVC-Pre L 2.15 2.14  FVC-Predicted Pre % 94 85  FVC-Post L 2.19 2.09  FVC-Predicted Post % 95 83  Pre FEV1/FVC % % 85 84  Post FEV1/FCV % % 87 86  FEV1-Pre L 1.83 1.80  FEV1-Predicted Pre % 105 93  DLCO UNC% % 81 82  DLCO COR %Predicted % 82 83   07/15/2018-pulmonary function test- FVC 2.150 (94% predicted), ratio 85, FEV1 105, no bronchodilator response, DLCO 81 normal flow volume loops  Imaging: Dg  Bone Density  Result Date: 07/03/2018 EXAM: DUAL X-RAY ABSORPTIOMETRY (DXA) FOR BONE MINERAL DENSITY IMPRESSION: Referring Physician:  MARNE A TOWER Your patient completed a BMD test using Lunar IDXA DXA system ( analysis version: 16 ) manufactured by [employer]. Technologist: KT PATIENT: Name: Diane [name], Diane [name] Patient ID: [id number] Birth Date: 03-23-1956 Height:     57.2 in. Weight:     138.0 lbs. Sex: Female Measured: 07/03/2018 Indications: Bilateral Ovariectomy (65.51), Caucasian, Estrogen Deficient, Gabapentin, Height Loss (781.91), History of Fracture (Adult) (V15.51), Keppra, Low Calcium Intake (269.3), Postmenopausal, Tobacco User (Current Smoker),  Secondary Osteoporosis Fractures: Left wrist Treatments: None ASSESSMENT: The BMD measured at Femur Neck Right is 0.839 g/cm2 with a T-score of -1.4. This patient is considered OSTEOPENIC according to [facility]) criteria. The scan quality is good. L3 and L4  were excluded due to degenerative changes. Site Region Measured Date Measured Age YA T-score BMD Significant CHANGE DualFemur Neck Right 07/03/2018    62.3         -1.4    0.839 g/cm2 AP Spine  L1-L2      07/03/2018    62.3         -0.5    1.107 g/cm2 DualFemur Total Mean 07[age]03/2018    62.3         -0.8    0.901 g/cm2 World Health Organization [facility]) criteria for post-menopausal, Caucasian Women: Normal       T-score at or above -1 SD Osteopenia   T-score between -1 and -2.5 SD Osteoporosis T-score at or below -2.5 SD RECOMMENDATION: 1. All patients should optimize calcium and vitamin D intake. 2. Consider FDA approved medical therapies in postmenopausal women and men aged 73 years and older, based on the following: a. A hip or vertebral (clinical or morphometric) fracture b. T- score < or = -2.5 at the femoral neck or spine after appropriate evaluation to exclude secondary causes c. Low bone mass (T-score between -1.0 and -2.5 at the femoral neck or spine) and a 10 year probability of a hip fracture > or = 3% or a 10 year probability of a major osteoporosis-related fracture > or = 20% based on the [place]-adapted WHO algorithm d. Clinician judgment and/or patient preferences may indicate treatment for people with 10-year fracture probabilities above or below these levels FOLLOW-UP: People with diagnosed cases of osteoporosis or at high risk for fracture should have regular bone mineral density tests. For patients eligible for Medicare, routine testing is allowed once every 2 years. The testing frequency can be increased to one year for patients who have rapidly progressing disease, those who are receiving or discontinuing medical therapy to  restore bone mass, or have additional risk factors. I have reviewed this report and agree with the above findings. Mark A. Thornton Papas, M.D. [facility] FRAX* 10-year Probability of Fracture Based on femoral neck BMD: DualFemur (Right) Major Osteoporotic Fracture: 14.1% Hip Fracture:                2.3% Population:                  [place] (Caucasian) Risk Factors: History of Fracture (Adult) (V15.51), Tobacco User (Current Smoker), Secondary Osteoporosis *FRAX is a Materials engineer of the [employer] for Metabolic Bone Disease, a World Pharmacologist (WHO) [facility]. ASSESSMENT: The probability of a major osteoporotic fracture is 14.1 % within the next ten years. The probability of hip fracture is 2.3  % within the  next 10 years. I have reviewed this report and agree with the above findings. Mark A. Thornton Papas, M.D. [facility] Electronically Signed   By: Lavonia Dana M.D.   On: 07/03/2018 13:46   Mm 3d Screen Breast Bilateral  Result Date: 07/03/2018 CLINICAL DATA:  Screening. EXAM: DIGITAL SCREENING BILATERAL MAMMOGRAM WITH TOMO AND CAD COMPARISON:  Previous exam(s). ACR Breast Density Category c: The breast tissue is heterogeneously dense, which may obscure small masses. FINDINGS: There are no findings suspicious for malignancy. Images were processed with CAD. IMPRESSION: No mammographic evidence of malignancy. A result letter of this screening mammogram will be mailed directly to the patient. RECOMMENDATION: Screening mammogram in one year. (Code:[id number]-B-01Y) BI-RADS CATEGORY  1: Negative. Electronically Signed   By: Kristopher Oppenheim M.D.   On: 07/03/2018 17:06   Ct Chest Lung Ca Screen Low Dose W/o Cm  Result Date: 06/25/2018 CLINICAL DATA:  62 year old female current smoker with 76 pack-year history of smoking. Lung cancer screening examination. EXAM: CT CHEST WITHOUT CONTRAST LOW-DOSE FOR LUNG CANCER SCREENING TECHNIQUE: Multidetector CT imaging of the chest  was performed following the standard protocol without IV contrast. COMPARISON:  No priors. FINDINGS: Cardiovascular: Heart size is normal. There is no significant pericardial fluid, thickening or pericardial calcification. There is aortic atherosclerosis, as well as atherosclerosis of the great vessels of the mediastinum and the coronary arteries, including calcified atherosclerotic plaque in the left main, left anterior descending, left circumflex and right coronary arteries. Status post median sternotomy for CABG including LIMA to the LAD. Mediastinum/Nodes: No pathologically enlarged mediastinal or hilar lymph nodes. Please note that accurate exclusion of hilar adenopathy is limited on noncontrast CT scans. Esophagus is unremarkable in appearance. No axillary lymphadenopathy. Lungs/Pleura: Several small pulmonary nodules are noted in the lungs bilaterally, the largest of which is in the medial aspect of the left lower lobe (axial image 221 of series 3), with a volume derived mean diameter of 5.6 mm. No larger more suspicious appearing pulmonary nodules or masses are noted. No acute consolidative airspace disease. No pleural effusions. Mild diffuse bronchial wall thickening with mild centrilobular and paraseptal emphysema. Upper Abdomen: Thickening of the adrenal glands bilaterally, similar to the prior study, presumably indicative of adrenal hyperplasia. Musculoskeletal: Median sternotomy wires. There are no aggressive appearing lytic or blastic lesions noted in the visualized portions of the skeleton. IMPRESSION: 1. Lung-RADS 2, benign appearance or behavior. Continue annual screening with low-dose chest CT without contrast in 12 months. 2. Mild diffuse bronchial wall thickening with mild centrilobular and paraseptal emphysema; imaging findings suggestive of underlying COPD. 3. Aortic atherosclerosis, in addition to left main and 3 vessel coronary artery disease. Status post median sternotomy for CABG including  LIMA to the LAD. Aortic Atherosclerosis (ICD10-I70.0) and Emphysema (ICD10-J43.9). Electronically Signed   By: Vinnie Langton M.D.   On: 06/25/2018 15:40     Chart Review:    Specialty Problems      Pulmonary Problems   Obstructive sleep apnea    PSG 04/10/06>> RDI 105  CPAP 06/13/11 to 07/10/11>>Used on 20 of 28 nights with median 3 hrs 45 min.  Average AHI 5 with median CPAP 7 cm H2O and 95th percentile CPAP 10 cm H2O. PSG 05/15/16 >> AHI 1.9, SpO2 low 83%       Allergic rhinitis   COPD with chronic bronchitis (HCC)    PFT 05/12/12>>FEV1 1.83 (98%), FEV1% 80, TLC 3.83 (98%), DLCO 61% (corrects for lung volumes), no BD response PFT 06/30/14 >> FEV1 1.80 (93%),  FEV1% 86, TLC 4.35 (108%), DLCO 82%, no BD Spirometry 05/11/16 >> FEV1 1.74 (89%), FEV1% 76  07/15/2018-pulmonary function test- FVC 2.150 (94% predicted), ratio 85, FEV1 105, no bronchodilator response, DLCO 81 normal flow volume loops  07/15/18 >>> trial of Anoro Ellipta      Sleep-related hypoventilation due to lower airway obstruction   Chronic respiratory failure (HCC)    ONO with RA 07/27/16 >> test time 6 hrs 25 min.  Mean SpO2 91.04%, low SpO2 68%.  Spent 18 min with SpO2 < 88%.      Smokers' cough (HCC)      Allergies  Allergen Reactions  . Bee Venom Itching and Swelling  . Entresto [Sacubitril-Valsartan] Cough  . Tetracycline     Unknown   . Ace Inhibitors Cough    Immunization History  Administered Date(s) Administered  . Influenza Whole 06/01/2012  . Influenza,inj,Quad PF,6+ Mos 06/16/2013, 06/16/2014, 06/21/2015, 08/13/2016, 10/11/2017, 07/15/2018  . Pneumococcal Conjugate-13 05/11/2016  . Pneumococcal Polysaccharide-23 11/18/1998, 11/04/2008, 06/16/2014  . Td 10/01/1996, 11/15/2009   >>> Patient to get flu vaccine today  Past Medical History:  Diagnosis Date  . Anemia   . Carotid stenosis    a. s/p right carotid stent 10/2017.  [name] Chronic systolic CHF (congestive heart failure) ([place])   .  Colon polyps   . COPD (chronic obstructive pulmonary disease) ([place])   . Coronary artery disease    post CABG in 3/07 , coronary stents   . Diabetes mellitus    type 2  . Dyslipidemia   . Fibromyalgia   . GERD (gastroesophageal reflux disease)   . Headache    hx of   . Hyperlipidemia   . Hypertension   . Myocardial infarction ([place])   . Pneumonia    hx of   . Seizures ([place])    last seizure- 03/2013   . Shortness of breath dyspnea    with exertion or when fluid builds up   . Sleep apnea    used to wear a cpap- not used in 3 years   . Status post dilation of esophageal narrowing   . Stroke [facility]) 1993   problems with balance   . Systolic murmur    known mild AS and MR  . Thyroid goiter   . Tobacco abuse     Tobacco History: Social History   Tobacco Use  Smoking Status Current Every Day Smoker  . Packs/day: 1.00  . Years: 51.00  . Pack years: 51.00  . Types: Cigarettes  Smokeless Tobacco Never Used  Tobacco Comment   1.5 pack to 2 packs per day. Has been working on reducing the # of cigarettes she smokes, down to 1/2 PPD   Ready to quit: Yes Counseling given: Yes Comment: 1.5 pack to 2 packs per day. Has been working on reducing the # of cigarettes she smokes, down to 1/2 PPD  Smoking assessment and cessation counseling  Patient currently smoking: 1 ppd  I have advised the patient to quit/stop smoking as soon as possible due to high risk for multiple medical problems.  It will also be very difficult for [place] to manage patient's  respiratory symptoms and status if we continue to expose her lungs to a known irritant.  We do not advise e-cigarettes as a form of stopping smoking.  Patient is willing to quit smoking.   I have advised the patient that we can assist and have options of nicotine replacement therapy, provided smoking cessation education today, provided smoking cessation  counseling, and provided cessation resources.  Patient reports she has attempted to use  Chantix in the past and failed.  Patient also attempted Wellbutrin in the past and this did not patient is willing to try her nicotine patches that she has at home which are 21 mg.  Patient that she can use those to help curb her smoking cravings she will just need to rotate sites that sometimes she can have arm irritation use.  Follow-up next office visit office visit for assessment of smoking cessation.  Smoking cessation counseling advised for: 6 min    Outpatient Encounter Medications as of 07/15/2018  Medication Sig  . acetaminophen (TYLENOL) 500 MG tablet Take 1,000 mg by mouth every 6 (six) hours as needed for moderate pain or headache.  . albuterol (VENTOLIN HFA) 108 (90 Base) MCG/ACT inhaler INHALER 2 PUFFS EVERY 6 HOURS AS NEEDED FOR WHEEZING/SHORTNESS OF BREATH  . aspirin EC 81 MG tablet Take 81 mg by mouth every evening.   . carvedilol (COREG) 25 MG tablet TAKE 1 TABLET (25 MG TOTAL) BY MOUTH 2 (TWO) TIMES DAILY WITH A MEAL.  . cetirizine (ZYRTEC) 10 MG tablet Take 10 mg by mouth daily as needed for allergies.  [name] clopidogrel (PLAVIX) 75 MG tablet TAKE 1 TABLET BY MOUTH EVERY DAY  . dexlansoprazole (DEXILANT) 60 MG capsule TAKE 1 CAPSULE (60 MG TOTAL) BY MOUTH DAILY.  . diphenhydrAMINE (BENADRYL) 25 mg capsule Take 2 capsules in AM and 1 capsule at night (Patient taking differently: Take 50 mg by mouth 2 (two) times daily as needed for itching or allergies. )  . fluticasone (FLONASE) 50 MCG/ACT nasal spray Place 2 sprays into both nostrils daily.  . furosemide (LASIX) 40 MG tablet TAKE 1 TABLET BY MOUTH EVERY DAY  . gabapentin (NEURONTIN) 300 MG capsule TAKE 2 CAPSULES BY MOUTH AT BEDTIME  . glimepiride (AMARYL) 2 MG tablet Take 2 mg by mouth 2 (two) times daily.  [name] guaiFENesin (MUCINEX) 600 MG 12 hr tablet Take 2 tablets (1,200 mg total) by mouth 2 (two) times daily.  . hydrochlorothiazide (HYDRODIURIL) 50 MG tablet Take 25 mg by mouth daily.  . isosorbide mononitrate (IMDUR) 30  MG 24 hr tablet TAKE 1 TABLET BY MOUTH EVERY DAY  . KLOR-CON M10 10 MEQ tablet TAKE 1 TABLET BY MOUTH TWICE A DAY  . levETIRAcetam (KEPPRA) 1000 MG tablet TAKE 1 TABLET BY MOUTH TWICE A DAY  . losartan (COZAAR) 100 MG tablet Take 0.5 tablets (50 mg total) by mouth daily.  . metFORMIN (GLUCOPHAGE-XR) 500 MG 24 hr tablet Take 500 mg by mouth 2 (two) times daily.  . nitroGLYCERIN (NITROLINGUAL) 0.4 MG/SPRAY spray PLACE 1 SPRAY UNDER THE TONGUE EVERY 5 (FIVE) MINUTES X 3 DOSES AS NEEDED FOR CHEST PAIN.  . ONE TOUCH ULTRA TEST test strip USE AS DIRECTED FOR TESTING BLOOD GLUCOSE 3 TIMES DAILY  . polyethylene glycol powder (GLYCOLAX/MIRALAX) powder TAKE 1 DOSE 17 GRAMS IN 8 OZ OF WATER DAILY AS NEEDED FOR CONSTIPATION.  . RANEXA 1000 MG SR tablet TAKE 1 TABLET BY MOUTH TWICE A DAY  . rosuvastatin (CRESTOR) 10 MG tablet TAKE 1 TABLET BY MOUTH DAILY  . sodium chloride (OCEAN) 0.65 % SOLN nasal spray Place 1 spray into both nostrils as needed for congestion.  . Tiotropium Bromide-Olodaterol (STIOLTO RESPIMAT) 2.5-2.5 MCG/ACT AERS Inhale 2 puffs into the lungs daily.  . [DISCONTINUED] albuterol (VENTOLIN HFA) 108 (90 Base) MCG/ACT inhaler INHALER 2 PUFFS EVERY 6 HOURS AS NEEDED FOR WHEEZING/SHORTNESS OF  BREATH  . [DISCONTINUED] fluticasone (FLONASE) 50 MCG/ACT nasal spray Place 2 sprays into both nostrils daily.  [name] oxyCODONE (OXY IR/ROXICODONE) 5 MG immediate release tablet Take 1-2 tablets (5-10 mg total) by mouth every 4 (four) hours as needed for moderate pain. (Patient not taking: Reported on 06/09/2018)  . umeclidinium-vilanterol (ANORO ELLIPTA) 62.5-25 MCG/INH AEPB Inhale 1 puff into the lungs daily.  . [DISCONTINUED] azithromycin (ZITHROMAX) 250 MG tablet 500mg  (two tablets) today, then 250mg  (1 tablet) for the next 4 days  . [DISCONTINUED] predniSONE (DELTASONE) 10 MG tablet 4 tabs for 2 days, then 3 tabs for 2 days, 2 tabs for 2 days, then 1 tab for 2 days, then stop  . [DISCONTINUED] Tiotropium  Bromide-Olodaterol (STIOLTO RESPIMAT) 2.5-2.5 MCG/ACT AERS Inhale 2 puffs into the lungs daily.  . [DISCONTINUED] Tiotropium Bromide-Olodaterol (STIOLTO RESPIMAT) 2.5-2.5 MCG/ACT AERS Inhale 2 puffs into the lungs daily.   No facility-administered encounter medications on file as of 07/15/2018.      Review of Systems  Review of Systems  Constitutional: Positive for fatigue. Negative for chills, fever and unexpected weight change.  HENT: Negative for congestion, ear pain, postnasal drip, sinus pressure and sinus pain.   Respiratory: Positive for shortness of breath. Negative for cough, chest tightness and wheezing.   Cardiovascular: Negative for chest pain and palpitations.  Gastrointestinal: Negative for blood in stool, diarrhea, nausea and vomiting.  Genitourinary: Negative for dysuria, frequency and urgency.  Musculoskeletal: Positive for gait problem (uses walker ). Negative for arthralgias.  Skin: Negative for color change.  Allergic/Immunologic: Negative for environmental allergies and food allergies.  Neurological: Negative for dizziness, light-headedness and headaches.  Psychiatric/Behavioral: Negative for dysphoric mood. The patient is not nervous/anxious.   All other systems reviewed and are negative.    Physical Exam  BP 124/60 (BP Location: Left Arm, Cuff Size: Normal)   Pulse 80   Ht 4' 8.2" (1.427 m)   Wt 144 lb (65.3 kg)   LMP 09/02/2003   SpO2 100%   BMI 32.05 kg/m   Wt Readings from Last 5 Encounters:  07/15/18 144 lb (65.3 kg)  06/09/18 138 lb (62.6 kg)  05/30/18 138 lb 3.2 oz (62.7 kg)  04/28/18 139 lb 2 oz (63.1 kg)  04/21/18 139 lb (63 kg)     Physical Exam  Constitutional: She is oriented to person, place, and time and well-developed, well-nourished, and in no distress. No distress.  HENT:  Head: Normocephalic and atraumatic.  Right Ear: Hearing, tympanic membrane, external ear and ear canal normal.  Left Ear: Hearing, tympanic membrane, external  ear and ear canal normal.  Nose: Nose normal. Right sinus exhibits no maxillary sinus tenderness and no frontal sinus tenderness. Left sinus exhibits no maxillary sinus tenderness and no frontal sinus tenderness.  Mouth/Throat: Uvula is midline and oropharynx is clear and moist. No oropharyngeal exudate.  Eyes: Pupils are equal, round, and reactive to light.  Neck: Normal range of motion. Neck supple. No JVD present.  Cardiovascular: Normal rate, regular rhythm and normal heart sounds.  Pulmonary/Chest: Effort normal and breath sounds normal. No accessory muscle usage. No respiratory distress. She has no decreased breath sounds. She has no wheezes. She has no rhonchi.  Musculoskeletal: Normal range of motion. She exhibits no edema.  Lymphadenopathy:    She has no cervical adenopathy.  Neurological: She is alert and oriented to person, place, and time.  Skin: Skin is warm and dry. She is not diaphoretic. No erythema.  Psychiatric: Mood, memory, affect and judgment  normal.  Nursing note and vitals reviewed.     Lab Results:  CBC    Component Value Date/Time   WBC 9.0 03/03/2018 1437   RBC 4.89 03/03/2018 1437   HGB 12.6 03/03/2018 1437   HCT 38.4 03/03/2018 1437   PLT 249.0 03/03/2018 1437   MCV 78.5 03/03/2018 1437   MCH 26.3 10/10/2017 1911   MCHC 32.9 03/03/2018 1437   RDW 16.6 (H) 03/03/2018 1437   LYMPHSABS 1.4 03/03/2018 1437   MONOABS 0.6 03/03/2018 1437   EOSABS 0.0 03/03/2018 1437   BASOSABS 0.0 03/03/2018 1437    BMET    Component Value Date/Time   NA 135 03/03/2018 1437   NA 135 (L) 02/15/2017 1457   K 4.7 03/03/2018 1437   K 4.8 02/15/2017 1457   CL 98 03/03/2018 1437   CO2 30 03/03/2018 1437   CO2 26 02/15/2017 1457   GLUCOSE 222 (H) 03/03/2018 1437   GLUCOSE 98 02/15/2017 1457   GLUCOSE 251 (H) 10/10/2006 1254   BUN 21 03/03/2018 1437   BUN 10.8 02/15/2017 1457   CREATININE 0.70 03/03/2018 1437   CREATININE 0.69 05/06/2017 1405   CREATININE 0.7  02/15/2017 1457   CALCIUM 9.6 03/03/2018 1437   CALCIUM 9.3 02/15/2017 1457   GFRNONAA >60 10/10/2017 1911   GFRNONAA 88 03/22/2015 1213   GFRAA >60 10/10/2017 1911   GFRAA >89 03/22/2015 1213    BNP No results found for: BNP  ProBNP No results found for: PROBNP    Assessment & Plan:   Pleasant 62 year old female patient seen office visit today.  Pulmonary function tests look quite stable.  Emphasized the importance to the patient stop smoking.  Patient to be down to half a pack of cigarettes by next office visit.  Patient will patches to help with curbing smoking cessation.  Provided smoking cessation educational resources to patient.  Patient to continue to exercise daily.  Will trial patient on Anoro Ellipta this will replace her Stiolto.  The flu vaccine today.  Follow up in 2 months.   COPD with chronic bronchitis ([place]) Start Anoro Ellipta  >>> Take 1 puff daily in the morning right when you wake up >>>Rinse your mouth out after use >>>This is a daily maintenance inhaler, NOT a rescue inhaler >>>Contact our office if you are having difficulties affording or obtaining this medication >>>It is important for you to be able to take this daily and not miss any doses   >>>this replaces your STIOLTO   We will follow-up with you in a week to see how you are doing on the Anoro Ellipta  Continue to exercise and walk daily  Regular flu vaccine today   Work to aggressively stop smoking >>>Please be down to half pack per day >>> I recommend that you choose a quit date in the future in order for [place] to work towards that goal of being smoke free   We recommend that you stop smoking.   Smoking Cessation Resources:  1 800 QUIT NOW  >>> Patient to call this resource and utilize it to help support her quit smoking >>> Keep up your hard work with stopping smoking  You can also contact the [facility] >>>For smoking cessation classes call [phone]  We do not  recommend using e-cigarettes as a form of stopping smoking   Follow-up with our office in 2 months    TOBACCO USE  Regular flu vaccine today   Work to aggressively stop smoking >>>Please be  down to half pack per day >>> I recommend that you choose a quit date in the future in order for [place] to work towards that goal of being smoke free   We recommend that you stop smoking.   Smoking Cessation Resources:  1 800 QUIT NOW  >>> Patient to call this resource and utilize it to help support her quit smoking >>> Keep up your hard work with stopping smoking  You can also contact the [facility] >>>For smoking cessation classes call [phone]  We do not recommend using e-cigarettes as a form of stopping smoking  Follow-up with our office in 2 months      Lauraine Rinne, NP 07/15/2018

## 2018-07-15 ENCOUNTER — Ambulatory Visit (INDEPENDENT_AMBULATORY_CARE_PROVIDER_SITE_OTHER): Payer: Medicare Other | Admitting: Pulmonary Disease

## 2018-07-15 ENCOUNTER — Encounter: Payer: Self-pay | Admitting: Pulmonary Disease

## 2018-07-15 VITALS — BP 124/60 | HR 80 | Ht <= 58 in | Wt 144.0 lb

## 2018-07-15 DIAGNOSIS — Z23 Encounter for immunization: Secondary | ICD-10-CM | POA: Diagnosis not present

## 2018-07-15 DIAGNOSIS — F17200 Nicotine dependence, unspecified, uncomplicated: Secondary | ICD-10-CM

## 2018-07-15 DIAGNOSIS — F17210 Nicotine dependence, cigarettes, uncomplicated: Secondary | ICD-10-CM | POA: Diagnosis not present

## 2018-07-15 DIAGNOSIS — J449 Chronic obstructive pulmonary disease, unspecified: Secondary | ICD-10-CM

## 2018-07-15 LAB — PULMONARY FUNCTION TEST
DL/VA % pred: 82 %
DL/VA: 2.92 ml/min/mmHg/L
DLCO unc % pred: 81 %
DLCO unc: 11.23 ml/min/mmHg
FEF 25-75 Post: 2.98 L/sec
FEF 25-75 Pre: 2.54 L/sec
FEF2575-%Change-Post: 17 %
FEF2575-%Pred-Post: 171 %
FEF2575-%Pred-Pre: 146 %
FEV1-%Change-Post: 4 %
FEV1-%Pred-Post: 109 %
FEV1-%Pred-Pre: 105 %
FEV1-Post: 1.91 L
FEV1-Pre: 1.83 L
FEV1FVC-%Change-Post: 2 %
FEV1FVC-%Pred-Pre: 110 %
FEV6-%Change-Post: 1 %
FEV6-%Pred-Post: 99 %
FEV6-%Pred-Pre: 98 %
FEV6-Post: 2.19 L
FEV6-Pre: 2.15 L
FEV6FVC-%Change-Post: 0 %
FEV6FVC-%Pred-Post: 104 %
FEV6FVC-%Pred-Pre: 104 %
FVC-%Change-Post: 1 %
FVC-%Pred-Post: 95 %
FVC-%Pred-Pre: 94 %
FVC-Post: 2.19 L
FVC-Pre: 2.15 L
Post FEV1/FVC ratio: 87 %
Post FEV6/FVC ratio: 100 %
Pre FEV1/FVC ratio: 85 %
Pre FEV6/FVC Ratio: 100 %
RV % pred: 129 %
RV: 2.17 L
TLC % pred: 111 %
TLC: 4.34 L

## 2018-07-15 MED ORDER — ALBUTEROL SULFATE HFA 108 (90 BASE) MCG/ACT IN AERS: INHALATION_SPRAY | RESPIRATORY_TRACT | 6 refills | Status: DC

## 2018-07-15 MED ORDER — UMECLIDINIUM-VILANTEROL 62.5-25 MCG/INH IN AEPB: 1.0000 | INHALATION_SPRAY | Freq: Every day | RESPIRATORY_TRACT | 0 refills | Status: DC

## 2018-07-15 MED ORDER — FLUTICASONE PROPIONATE 50 MCG/ACT NA SUSP: 2.0000 | Freq: Every day | NASAL | 11 refills | Status: DC

## 2018-07-15 NOTE — Progress Notes (Signed)
Discussed results with patient in office.  Nothing further is needed at this time.  Alaijah Gibler FNP  

## 2018-07-15 NOTE — Progress Notes (Signed)
Reviewed and agree with assessment/plan.   Lucita Montoya, MD [facility] 09/26/2016, 12:24 PM Pager:  [phone]  

## 2018-07-15 NOTE — Progress Notes (Signed)
PFT completed today. 07/15/18

## 2018-07-15 NOTE — Assessment & Plan Note (Signed)
  Regular flu vaccine today   Work to aggressively stop smoking >>>Please be down to half pack per day >>> I recommend that you choose a quit date in the future in order for [place] to work towards that goal of being smoke free   We recommend that you stop smoking.   Smoking Cessation Resources:  1 800 QUIT NOW  >>> Patient to call this resource and utilize it to help support her quit smoking >>> Keep up your hard work with stopping smoking  You can also contact the [facility] >>>For smoking cessation classes call [phone]  We do not recommend using e-cigarettes as a form of stopping smoking  Follow-up with our office in 2 months

## 2018-07-15 NOTE — Patient Instructions (Signed)
Start Anoro Ellipta  >>> Take 1 puff daily in the morning right when you wake up >>>Rinse your mouth out after use >>>This is a daily maintenance inhaler, NOT a rescue inhaler >>>Contact our office if you are having difficulties affording or obtaining this medication >>>It is important for you to be able to take this daily and not miss any doses   >>>this replaces your STIOLTO   We will follow-up with you in a week to see how you are doing on the Anoro Ellipta  Continue to exercise and walk daily  Regular flu vaccine today   Work to aggressively stop smoking >>>Please be down to half pack per day >>> I recommend that you choose a quit date in the future in order for [place] to work towards that goal of being smoke free   We recommend that you stop smoking.   Smoking Cessation Resources:  1 800 QUIT NOW  >>> Patient to call this resource and utilize it to help support her quit smoking >>> Keep up your hard work with stopping smoking  You can also contact the [facility] >>>For smoking cessation classes call [phone]  We do not recommend using e-cigarettes as a form of stopping smoking   Follow-up with our office in 2 months    November/2019 we will be moving! We will no longer be at our [place] location.  Be on the look out for a post card/mailer to let you know we have officially moved.  Our new address and phone number will be:  [address] [place], [place] [address] Telephone number: [phone]  It is flu season:   >>>Remember to be washing your hands regularly, using hand sanitizer, be careful to use around herself with has contact with people who are sick will increase her chances of getting sick yourself. >>> Best ways to protect herself from the flu: Receive the yearly flu vaccine, practice good hand hygiene washing with soap and also using hand sanitizer when available, eat a nutritious meals, get adequate rest, hydrate  appropriately   Please contact the office if your symptoms worsen or you have concerns that you are not improving.   Thank you for choosing  [facility] for your healthcare, and for allowing [place] to partner with you on your healthcare journey. I am thankful to be able to provide care to you today.   Wyn Quaker FNP-C    Coping with Quitting Smoking Quitting smoking is a physical and mental challenge. You will face cravings, withdrawal symptoms, and temptation. Before quitting, work with your health care provider to make a plan that can help you cope. Preparation can help you quit and keep you from giving in. How can I cope with cravings? Cravings usually last for 5-10 minutes. If you get through it, the craving will pass. Consider taking the following actions to help you cope with cravings:  Keep your mouth busy: ? Chew sugar-free gum. ? Suck on hard candies or a straw. ? Brush your teeth.  Keep your hands and body busy: ? Immediately change to a different activity when you feel a craving. ? Squeeze or play with a ball. ? Do an activity or a hobby, like making bead jewelry, practicing needlepoint, or working with wood. ? Mix up your normal routine. ? Take a short exercise break. Go for a quick walk or run up and down stairs. ? Spend time in public places where smoking is not allowed.  Focus on  doing something kind or helpful for someone else.  Call a friend or family member to talk during a craving.  Join a support group.  Call a quit line, such as 1-800-QUIT-NOW.  Talk with your health care provider about medicines that might help you cope with cravings and make quitting easier for you.  How can I deal with withdrawal symptoms? Your body may experience negative effects as it tries to get used to not having nicotine in the system. These effects are called withdrawal symptoms. They may include:  Feeling hungrier than normal.  Trouble  concentrating.  Irritability.  Trouble sleeping.  Feeling depressed.  Restlessness and agitation.  Craving a cigarette.  To manage withdrawal symptoms:  Avoid places, people, and activities that trigger your cravings.  Remember why you want to quit.  Get plenty of sleep.  Avoid coffee and other caffeinated drinks. These may worsen some of your symptoms.  How can I handle social situations? Social situations can be difficult when you are quitting smoking, especially in the first few weeks. To manage this, you can:  Avoid parties, bars, and other social situations where people might be smoking.  Avoid alcohol.  Leave right away if you have the urge to smoke.  Explain to your family and friends that you are quitting smoking. Ask for understanding and support.  Plan activities with friends or family where smoking is not an option.  What are some ways I can cope with stress? Wanting to smoke may cause stress, and stress can make you want to smoke. Find ways to manage your stress. Relaxation techniques can help. For example:  Breathe slowly and deeply, in through your nose and out through your mouth.  Listen to soothing, relaxing music.  Talk with a family member or friend about your stress.  Light a candle.  Soak in a bath or take a shower.  Think about a peaceful place.  What are some ways I can prevent weight gain? Be aware that many people gain weight after they quit smoking. However, not everyone does. To keep from gaining weight, have a plan in place before you quit and stick to the plan after you quit. Your plan should include:  Having healthy snacks. When you have a craving, it may help to: ? Eat plain popcorn, crunchy carrots, celery, or other cut vegetables. ? Chew sugar-free gum.  Changing how you eat: ? Eat small portion sizes at meals. ? Eat 4-6 small meals throughout the day instead of 1-2 large meals a day. ? Be mindful when you eat. Do not watch  television or do other things that might distract you as you eat.  Exercising regularly: ? Make time to exercise each day. If you do not have time for a long workout, do short bouts of exercise for 5-10 minutes several times a day. ? Do some form of strengthening exercise, like weight lifting, and some form of aerobic exercise, like running or swimming.  Drinking plenty of water or other low-calorie or no-calorie drinks. Drink 6-8 glasses of water daily, or as much as instructed by your health care provider.  Summary  Quitting smoking is a physical and mental challenge. You will face cravings, withdrawal symptoms, and temptation to smoke again. Preparation can help you as you go through these challenges.  You can cope with cravings by keeping your mouth busy (such as by chewing gum), keeping your body and hands busy, and making calls to family, friends, or a helpline for people who  want to quit smoking.  You can cope with withdrawal symptoms by avoiding places where people smoke, avoiding drinks with caffeine, and getting plenty of rest.  Ask your health care provider about the different ways to prevent weight gain, avoid stress, and handle social situations. This information is not intended to replace advice given to you by your health care provider. Make sure you discuss any questions you have with your health care provider. Document Released: 09/14/2016 Document Revised: 09/14/2016 Document Reviewed: 09/14/2016 Elsevier Interactive Patient Education  2018 [place].   Influenza Virus Vaccine injection What is this medicine? INFLUENZA VIRUS VACCINE (in floo EN zuh VAHY ruhs vak SEEN) helps to reduce the risk of getting influenza also known as the flu. The vaccine only helps protect you against some strains of the flu. This medicine may be used for other purposes; ask your health care provider or pharmacist if you have questions. COMMON BRAND NAME(S): Afluria, Agriflu, Alfuria, FLUAD,  Fluarix, Fluarix Quadrivalent, Flublok, Flublok Quadrivalent, FLUCELVAX, Flulaval, Fluvirin, Fluzone, Fluzone High-Dose, Fluzone Intradermal What should I tell my health care provider before I take this medicine? They need to know if you have any of these conditions: -bleeding disorder like hemophilia -fever or infection -Guillain-Barre syndrome or other neurological problems -immune system problems -infection with the human immunodeficiency virus (HIV) or AIDS -low blood platelet counts -multiple sclerosis -an unusual or allergic reaction to influenza virus vaccine, latex, other medicines, foods, dyes, or preservatives. Different brands of vaccines contain different allergens. Some may contain latex or eggs. Talk to your doctor about your allergies to make sure that you get the right vaccine. -pregnant or trying to get pregnant -breast-feeding How should I use this medicine? This vaccine is for injection into a muscle or under the skin. It is given by a health care professional. A copy of Vaccine Information Statements will be given before each vaccination. Read this sheet carefully each time. The sheet may change frequently. Talk to your healthcare provider to see which vaccines are right for you. Some vaccines should not be used in all age groups. Overdosage: If you think you have taken too much of this medicine contact a poison control center or emergency room at once. NOTE: This medicine is only for you. Do not share this medicine with others. What if I miss a dose? This does not apply. What may interact with this medicine? -chemotherapy or radiation therapy -medicines that lower your immune system like etanercept, anakinra, infliximab, and adalimumab -medicines that treat or prevent blood clots like warfarin -phenytoin -steroid medicines like prednisone or cortisone -theophylline -vaccines This list may not describe all possible interactions. Give your health care provider a list  of all the medicines, herbs, non-prescription drugs, or dietary supplements you use. Also tell them if you smoke, drink alcohol, or use illegal drugs. Some items may interact with your medicine. What should I watch for while using this medicine? Report any side effects that do not go away within 3 days to your doctor or health care professional. Call your health care provider if any unusual symptoms occur within 6 weeks of receiving this vaccine. You may still catch the flu, but the illness is not usually as bad. You cannot get the flu from the vaccine. The vaccine will not protect against colds or other illnesses that may cause fever. The vaccine is needed every year. What side effects may I notice from receiving this medicine? Side effects that you should report to your doctor or health care  professional as soon as possible: -allergic reactions like skin rash, itching or hives, swelling of the face, lips, or tongue Side effects that usually do not require medical attention (report to your doctor or health care professional if they continue or are bothersome): -fever -headache -muscle aches and pains -pain, tenderness, redness, or swelling at the injection site -tiredness This list may not describe all possible side effects. Call your doctor for medical advice about side effects. You may report side effects to FDA at 1-800-FDA-1088. Where should I keep my medicine? The vaccine will be given by a health care professional in a clinic, pharmacy, doctor's office, or other health care setting. You will not be given vaccine doses to store at home. NOTE: This sheet is a summary. It may not cover all possible information. If you have questions about this medicine, talk to your doctor, pharmacist, or health care provider.  2018 Elsevier/Gold Standard (2015-04-08 10:07:28)

## 2018-07-15 NOTE — Assessment & Plan Note (Signed)
Start Anoro Ellipta  >>> Take 1 puff daily in the morning right when you wake up >>>Rinse your mouth out after use >>>This is a daily maintenance inhaler, NOT a rescue inhaler >>>Contact our office if you are having difficulties affording or obtaining this medication >>>It is important for you to be able to take this daily and not miss any doses   >>>this replaces your STIOLTO   We will follow-up with you in a week to see how you are doing on the Anoro Ellipta  Continue to exercise and walk daily  Regular flu vaccine today   Work to aggressively stop smoking >>>Please be down to half pack per day >>> I recommend that you choose a quit date in the future in order for [place] to work towards that goal of being smoke free   We recommend that you stop smoking.   Smoking Cessation Resources:  1 800 QUIT NOW  >>> Patient to call this resource and utilize it to help support her quit smoking >>> Keep up your hard work with stopping smoking  You can also contact the [facility] >>>For smoking cessation classes call [phone]  We do not recommend using e-cigarettes as a form of stopping smoking   Follow-up with our office in 2 months

## 2018-07-19 ENCOUNTER — Other Ambulatory Visit: Payer: Self-pay | Admitting: Cardiology

## 2018-07-22 ENCOUNTER — Telehealth: Payer: Self-pay | Admitting: *Deleted

## 2018-07-22 NOTE — Telephone Encounter (Signed)
Called spoke to patient as requested by Wyn Quaker FNP, to check on how patient was doing on Anoro.  Patient states no worse no better. Told her to keep her follow up and to call if she needed [place] sooner.    Nothing further needed.

## 2018-08-07 ENCOUNTER — Other Ambulatory Visit: Payer: Self-pay | Admitting: Cardiovascular Disease

## 2018-08-08 ENCOUNTER — Telehealth: Payer: Self-pay | Admitting: *Deleted

## 2018-08-08 NOTE — Telephone Encounter (Signed)
Almyra Free with [facility] left vm at triage stating she was at pts home for a visit late yesterday evening, and the pt has complaints of bilateral leg weakness and burning sensation from her hip to legs. Pt reports that she had labs recently done, and Almyra Free wanted to advise Dr Glori Bickers as an Juluis Rainier to determine if she is needing to be seen, or if there are orders you would like her to complete at her next home visit. pls advise

## 2018-08-08 NOTE — Telephone Encounter (Signed)
[facility] assistant notified of Dr. Marliss Coots comments and instructions and they will f/u with pt

## 2018-08-08 NOTE — Telephone Encounter (Signed)
She does have a hx of both diabetic neuropathy and peripheral vascular dz (sees endocrinology and cardiol/vasc surg and also neurology) Also takes gabapentin   Unsure which is in play at this point exp if symptoms are new or worsened  If symptoms continue or do not improve-please f/u Also watch carefully for s/s of CVA as always

## 2018-09-02 ENCOUNTER — Other Ambulatory Visit: Payer: Self-pay | Admitting: Neurology

## 2018-09-08 ENCOUNTER — Other Ambulatory Visit: Payer: Self-pay | Admitting: *Deleted

## 2018-09-08 MED ORDER — AZITHROMYCIN 250 MG PO TABS: ORAL_TABLET | ORAL | 0 refills | Status: DC

## 2018-09-08 MED ORDER — PROMETHAZINE-DM 6.25-15 MG/5ML PO SYRP: 5.0000 mL | ORAL_SOLUTION | Freq: Four times a day (QID) | ORAL | 0 refills | Status: DC | PRN

## 2018-09-08 NOTE — Telephone Encounter (Signed)
I pended orders to send  Unsure which pharmacy Thanks

## 2018-09-08 NOTE — Telephone Encounter (Signed)
Spoke to [place] and confirmed pharmacy is one listed on file. Rx sent as prescribed

## 2018-09-08 NOTE — Telephone Encounter (Signed)
Spoke with Diane [name] of [facility] [place] who is requesting abx and cough med for cough. As of yesterday she has yellow and green sputum. pls advise

## 2018-09-13 ENCOUNTER — Other Ambulatory Visit: Payer: Self-pay | Admitting: Cardiology

## 2018-09-15 ENCOUNTER — Ambulatory Visit: Payer: Medicare Other | Admitting: Pulmonary Disease

## 2018-09-30 ENCOUNTER — Encounter: Payer: Self-pay | Admitting: Pulmonary Disease

## 2018-10-06 ENCOUNTER — Ambulatory Visit: Payer: Medicare Other | Admitting: Pulmonary Disease

## 2018-10-07 ENCOUNTER — Other Ambulatory Visit: Payer: Self-pay | Admitting: Neurology

## 2018-10-07 ENCOUNTER — Ambulatory Visit (INDEPENDENT_AMBULATORY_CARE_PROVIDER_SITE_OTHER): Payer: Medicare Other | Admitting: Pulmonary Disease

## 2018-10-07 ENCOUNTER — Encounter: Payer: Self-pay | Admitting: Pulmonary Disease

## 2018-10-07 VITALS — BP 126/72 | HR 74 | Temp 97.7°F | Ht <= 58 in | Wt 140.4 lb

## 2018-10-07 DIAGNOSIS — F17210 Nicotine dependence, cigarettes, uncomplicated: Secondary | ICD-10-CM | POA: Diagnosis not present

## 2018-10-07 DIAGNOSIS — J9611 Chronic respiratory failure with hypoxia: Secondary | ICD-10-CM | POA: Diagnosis not present

## 2018-10-07 DIAGNOSIS — K219 Gastro-esophageal reflux disease without esophagitis: Secondary | ICD-10-CM | POA: Diagnosis not present

## 2018-10-07 DIAGNOSIS — J449 Chronic obstructive pulmonary disease, unspecified: Secondary | ICD-10-CM

## 2018-10-07 DIAGNOSIS — F17200 Nicotine dependence, unspecified, uncomplicated: Secondary | ICD-10-CM

## 2018-10-07 MED ORDER — NICOTINE POLACRILEX 4 MG MT GUM: 4.0000 mg | CHEWING_GUM | OROMUCOSAL | 2 refills | Status: DC | PRN

## 2018-10-07 MED ORDER — IPRATROPIUM-ALBUTEROL 0.5-2.5 (3) MG/3ML IN SOLN: RESPIRATORY_TRACT | 3 refills | Status: DC

## 2018-10-07 NOTE — Assessment & Plan Note (Signed)
Plan: Continue Dexilant

## 2018-10-07 NOTE — Assessment & Plan Note (Addendum)
Assessment: mild centrilobular and paraseptal emphysema on September/2019 lung cancer screening CT Barrel chest Lungs clear to auscultation but diminished in the bases Patient reports no clinical improvement with previous inhaler therapies Patient does endorse clinical improvement when taking albuterol Patient has a nebulizer at home Patient still actively smoking 1 pack/day  Plan: DuoNeb nebulized medications have been prescribed for the patient We will have patient not be on maintenance inhalers at this time as she has not seen any clinical improvement and financially they are costly to her Consider pulmonary rehab referral, patient reports she will think about it Spent a considerable amount of time with the patient today discussing smoking sensation >>> Patient is to start 21 mg patches as well as 4 mg gum to have 2 nicotine replacement agents nightly at 1 time and to set a quit date Patient is to follow-up with Dr. Halford Chessman in 3 months

## 2018-10-07 NOTE — Assessment & Plan Note (Signed)
Plan: Continue nocturnal oxygen as prescribed

## 2018-10-07 NOTE — Progress Notes (Signed)
@Patient  ID: Diane [name], female    DOB: January 24, 1956, 63 y.o.   MRN: [id number]  Chief Complaint  Patient presents with  . Follow-up    Emphysema follow-up    Referring provider: Tower, [name], MD  HPI:  63 year old female current smoker followed in our office for bronchitis  PMH: Combined CHF, CAD s/p CABG, CVA, DM, HLD, HTN, Fibromyalgia, GERD, Goiter, Pneumonia, Seizures Smoker/ Smoking History: Current smoker. 1ppd. 70+ pack years.  Maintenance: none  Pt of: Dr. Halford Chessman   10/07/2018  - Visit   63 year old female current every day smoker (smoking 1 pack/day) presenting today for an emphysema follow-up.  Patient reports she has had baseline symptoms.  Patient was trialed on Anoro Ellipta after last office visit and she reports there is no improvement.  Patient reports she is never had improvement with inhalers.  Patient reports the only thing that has helped her in the past is albuterol.  Patient has been using her albuterol about 4 times a day.  Patient reports she feels that she is at her baseline.  She does have an occasional cough with clear to white mucus.  Patient endorses wheezing and shortness of breath with exertion.  mMRC today is 4.  Patient reports 3 weeks ago she had a worsening cough with green productive mucus she called into her primary care office and she was treated telephonically with a Z-Pak.  Patient was not treated with prednisone.  MMRC - Breathlessness Score 4 - I am too breathless to leave the house or I am breathlessness when dressing  Patient is actively trying to stop smoking.  She is in the past been trying to do this cold [place].  Patient cannot take Chantix or Wellbutrin.  Patient does have nicotine patches at home 21 mg which she is willing to start using.  Patient has not set a quit date.  Patient also reports that she is started palliative care as she has congestive heart failure.  This is managed by her primary care.  She has a palliative care nurse  that comes out to evaluate her about once a week.    Tests:  PSG 04/10/06>> RDI 105  CPAP 06/13/11 to 07/10/11>>Used on 20 of 28 nights with median 3 hrs 45 min.  Average AHI 5 with median CPAP 7 cm H2O and 95th percentile CPAP 10 cm H2O. PSG 05/15/16 >> AHI 1.9, SpO2 low 83%  Pulmonary tests PFT 05/12/12>>FEV1 1.83 (98%), FEV1% 80, TLC 3.83 (98%), DLCO 61% (corrects for lung volumes), no BD response PFT 06/30/14 >> FEV1 1.80 (93%), FEV1% 86, TLC 4.35 (108%), DLCO 82%, no BD Spirometry 05/11/16 >> FEV1 1.74 (89%), FEV1% 76  ONO with RA 07/27/16 >> test time 6 hrs 25 min.  Mean SpO2 91.04%, low SpO2 68%.  Spent 18 min with SpO2 < 88%.  07/15/2018-pulmonary function test- FVC 2.15 (94% predicted), postbronchodilator ratio 87, postbronchodilator FEV1 1.91 (109% predicted), no significant bronchodilator response, slight mid flow reversibility after albuterol, DLCO 81 >>>Normal spirometry, No significant bronchodilator response, mild air trapping on lung volume testing   Cardiac tests Echo 04/25/15 >> EF 15%, grade 2 diastolic CHF, mod MR, PAS 45 mmHg   FENO:  No results found for: NITRICOXIDE  PFT: PFT Results Latest Ref Rng & Units 07/15/2018 06/30/2014  FVC-Pre L 2.15 2.14  FVC-Predicted Pre % 94 85  FVC-Post L 2.19 2.09  FVC-Predicted Post % 95 83  Pre FEV1/FVC % % 85 84  Post FEV1/FCV % %  87 86  FEV1-Pre L 1.83 1.80  FEV1-Predicted Pre % 105 93  FEV1-Post L 1.91 1.80  DLCO UNC% % 81 82  DLCO COR %Predicted % 82 83  TLC L 4.34 4.35  TLC % Predicted % 111 108  RV % Predicted % 129 133    Imaging: No results found.    Specialty Problems      Pulmonary Problems   Obstructive sleep apnea    PSG 04/10/06>> RDI 105  CPAP 06/13/11 to 07/10/11>>Used on 20 of 28 nights with median 3 hrs 45 min.  Average AHI 5 with median CPAP 7 cm H2O and 95th percentile CPAP 10 cm H2O. PSG 05/15/16 >> AHI 1.9, SpO2 low 83%       Allergic rhinitis   COPD with chronic bronchitis (HCC)     PFT 05/12/12>>FEV1 1.83 (98%), FEV1% 80, TLC 3.83 (98%), DLCO 61% (corrects for lung volumes), no BD response PFT 06/30/14 >> FEV1 1.80 (93%), FEV1% 86, TLC 4.35 (108%), DLCO 82%, no BD Spirometry 05/11/16 >> FEV1 1.74 (89%), FEV1% 76  07/15/2018-pulmonary function test- FVC 2.150 (94% predicted), ratio 85, FEV1 105, no bronchodilator response, DLCO 81 normal flow volume loops  07/15/18 >>> trial of Anoro Ellipta      Sleep-related hypoventilation due to lower airway obstruction   Chronic respiratory failure ([place])    ONO with RA 07/27/16 >> test time 6 hrs 25 min.  Mean SpO2 91.04%, low SpO2 68%.  Spent 18 min with SpO2 < 88%.      Smokers' cough (HCC)      Allergies  Allergen Reactions  . Bee Venom Itching and Swelling  . Entresto [Sacubitril-Valsartan] Cough  . Tetracycline     Unknown   . Ace Inhibitors Cough    Immunization History  Administered Date(s) Administered  . Influenza Whole 06/01/2012  . Influenza,inj,Quad PF,6+ Mos 06/16/2013, 06/16/2014, 06/21/2015, 08/13/2016, 10/11/2017, 07/15/2018  . Pneumococcal Conjugate-13 05/11/2016  . Pneumococcal Polysaccharide-23 11/18/1998, 11/04/2008, 06/16/2014  . Td 10/01/1996, 11/15/2009    Past Medical History:  Diagnosis Date  . Anemia   . Carotid stenosis    a. s/p right carotid stent 10/2017.  [name] Chronic systolic CHF (congestive heart failure) ([place])   . Colon polyps   . COPD (chronic obstructive pulmonary disease) ([place])   . Coronary artery disease    post CABG in 3/07 , coronary stents   . Diabetes mellitus    type 2  . Dyslipidemia   . Fibromyalgia   . GERD (gastroesophageal reflux disease)   . Headache    hx of   . Hyperlipidemia   . Hypertension   . Myocardial infarction ([place])   . Pneumonia    hx of   . Seizures ([place])    last seizure- 03/2013   . Shortness of breath dyspnea    with exertion or when fluid builds up   . Sleep apnea    used to wear a cpap- not used in 3 years   . Status post dilation of  esophageal narrowing   . Stroke [facility]) 1993   problems with balance   . Systolic murmur    known mild AS and MR  . Thyroid goiter   . Tobacco abuse     Tobacco History: Social History   Tobacco Use  Smoking Status Current Every Day Smoker  . Packs/day: 1.00  . Years: 51.00  . Pack years: 51.00  . Types: Cigarettes  Smokeless Tobacco Never Used  Tobacco Comment   1.5 pack  to 2 packs per day. Has been working on reducing the # of cigarettes she smokes, down to 1/2 PPD   Ready to quit: Yes Counseling given: Yes Comment: 1.5 pack to 2 packs per day. Has been working on reducing the # of cigarettes she smokes, down to 1/2 PPD  Smoking assessment and cessation counseling  Patient currently smoking: 1 pack/day I have advised the patient to quit/stop smoking as soon as possible due to high risk for multiple medical problems.  It will also be very difficult for [place] to manage patient's  respiratory symptoms and status if we continue to expose her lungs to a known irritant.  We do not advise e-cigarettes as a form of stopping smoking.  Patient is willing to quit smoking.  Patient has not set a quit date.  Patient believes that may be March/04/2019 could be a quit date.  I have advised the patient that we can assist and have options of nicotine replacement therapy, provided smoking cessation education today, provided smoking cessation counseling, and provided cessation resources.  Patient has a history of seizures would not like to try Chantix.  Patient would not like to try Wellbutrin.  Patient had been on Wellbutrin in the past and had no effect.  Patient is willing to try nicotine replacement therapies both patches as well as gum.  Follow-up next office visit office visit for assessment of smoking cessation.  Smoking cessation counseling advised for: 8 min    Outpatient Encounter Medications as of 10/07/2018  Medication Sig  . acetaminophen (TYLENOL) 500 MG tablet Take 1,000 mg by mouth  every 6 (six) hours as needed for moderate pain or headache.  . albuterol (VENTOLIN HFA) 108 (90 Base) MCG/ACT inhaler INHALER 2 PUFFS EVERY 6 HOURS AS NEEDED FOR WHEEZING/SHORTNESS OF BREATH  . aspirin EC 81 MG tablet Take 81 mg by mouth every evening.   . carvedilol (COREG) 25 MG tablet TAKE 1 TABLET (25 MG TOTAL) BY MOUTH 2 (TWO) TIMES DAILY WITH A MEAL.  . cetirizine (ZYRTEC) 10 MG tablet Take 10 mg by mouth daily as needed for allergies.  [name] clopidogrel (PLAVIX) 75 MG tablet TAKE 1 TABLET BY MOUTH EVERY DAY  . dexlansoprazole (DEXILANT) 60 MG capsule TAKE 1 CAPSULE (60 MG TOTAL) BY MOUTH DAILY.  . diphenhydrAMINE (BENADRYL) 25 mg capsule Take 2 capsules in AM and 1 capsule at night (Patient taking differently: Take 50 mg by mouth 2 (two) times daily as needed for itching or allergies. )  . fluticasone (FLONASE) 50 MCG/ACT nasal spray Place 2 sprays into both nostrils daily.  . furosemide (LASIX) 40 MG tablet TAKE 1 TABLET BY MOUTH EVERY DAY  . gabapentin (NEURONTIN) 300 MG capsule TAKE 2 CAPSULES BY MOUTH EVERY DAY AT BEDTIME  . glimepiride (AMARYL) 2 MG tablet Take 2 mg by mouth 2 (two) times daily.  [name] guaiFENesin (MUCINEX) 600 MG 12 hr tablet Take 2 tablets (1,200 mg total) by mouth 2 (two) times daily.  . hydrochlorothiazide (HYDRODIURIL) 50 MG tablet Take 25 mg by mouth daily.  . isosorbide mononitrate (IMDUR) 30 MG 24 hr tablet TAKE 1 TABLET BY MOUTH EVERY DAY  . KLOR-CON M10 10 MEQ tablet TAKE 1 TABLET BY MOUTH TWICE A DAY  . levETIRAcetam (KEPPRA) 1000 MG tablet TAKE 1 TABLET BY MOUTH TWICE A DAY  . losartan (COZAAR) 100 MG tablet Take 0.5 tablets (50 mg total) by mouth daily.  . metFORMIN (GLUCOPHAGE-XR) 500 MG 24 hr tablet Take 500 mg by mouth  2 (two) times daily.  . nitroGLYCERIN (NITROLINGUAL) 0.4 MG/SPRAY spray PLACE 1 SPRAY UNDER THE TONGUE EVERY 5 (FIVE) MINUTES X 3 DOSES AS NEEDED FOR CHEST PAIN.  . ONE TOUCH ULTRA TEST test strip USE AS DIRECTED FOR TESTING BLOOD GLUCOSE 3  TIMES DAILY  . oxyCODONE (OXY IR/ROXICODONE) 5 MG immediate release tablet Take 1-2 tablets (5-10 mg total) by mouth every 4 (four) hours as needed for moderate pain.  . polyethylene glycol powder (GLYCOLAX/MIRALAX) powder TAKE 1 DOSE 17 GRAMS IN 8 OZ OF WATER DAILY AS NEEDED FOR CONSTIPATION.  [name] promethazine-dextromethorphan (PROMETHAZINE-DM) 6.25-15 MG/5ML syrup Take 5 mLs by mouth 4 (four) times daily as needed for cough.  . ranolazine (RANEXA) 1000 MG SR tablet TAKE 1 TABLET BY MOUTH TWICE A DAY  . rosuvastatin (CRESTOR) 10 MG tablet TAKE 1 TABLET BY MOUTH EVERY DAY  . sodium chloride (OCEAN) 0.65 % SOLN nasal spray Place 1 spray into both nostrils as needed for congestion.  . [DISCONTINUED] azithromycin (ZITHROMAX Z-PAK) 250 MG tablet Take 2 pills by mouth today and then 1 pill daily for 4 days  . [DISCONTINUED] Tiotropium Bromide-Olodaterol (STIOLTO RESPIMAT) 2.5-2.5 MCG/ACT AERS Inhale 2 puffs into the lungs daily.  . [DISCONTINUED] umeclidinium-vilanterol (ANORO ELLIPTA) 62.5-25 MCG/INH AEPB Inhale 1 puff into the lungs daily.  . nicotine polacrilex (NICORETTE) 4 MG gum Take 1 each (4 mg total) by mouth as needed for smoking cessation.  . [DISCONTINUED] levETIRAcetam (KEPPRA) 1000 MG tablet TAKE 1 TABLET BY MOUTH TWICE A DAY   No facility-administered encounter medications on file as of 10/07/2018.      Review of Systems  Review of Systems  Constitutional: Positive for fatigue. Negative for chills, fever and unexpected weight change.  HENT: Positive for congestion. Negative for ear pain, postnasal drip, sinus pressure and sinus pain.   Respiratory: Positive for cough (clear white mucous), shortness of breath and wheezing (with wheezing). Negative for chest tightness.   Cardiovascular: Negative for chest pain and palpitations.  Gastrointestinal: Negative for blood in stool, diarrhea, nausea and vomiting.  Musculoskeletal: Negative for arthralgias.  Skin: Negative for color change.    Allergic/Immunologic: Negative for environmental allergies and food allergies.  Neurological: Negative for dizziness.  Psychiatric/Behavioral: Negative for dysphoric mood. The patient is not nervous/anxious.   All other systems reviewed and are negative.    Physical Exam  BP 126/72 (BP Location: Left Arm, Cuff Size: Normal)   Pulse 74   Temp 97.7 F (36.5 C) (Oral)   Ht 4' 8.2" (1.427 m)   Wt 140 lb 6.4 oz (63.7 kg)   LMP 09/02/2003   SpO2 93%   BMI 31.25 kg/m   Wt Readings from Last 5 Encounters:  10/07/18 140 lb 6.4 oz (63.7 kg)  07/15/18 144 lb (65.3 kg)  06/09/18 138 lb (62.6 kg)  05/30/18 138 lb 3.2 oz (62.7 kg)  04/28/18 139 lb 2 oz (63.1 kg)    Physical Exam  Constitutional: She is oriented to person, place, and time and well-developed, well-nourished, and in no distress. No distress.  HENT:  Head: Normocephalic and atraumatic.  Right Ear: Hearing, tympanic membrane, external ear and ear canal normal.  Left Ear: Hearing, tympanic membrane, external ear and ear canal normal.  Nose: Nose normal. Right sinus exhibits no maxillary sinus tenderness and no frontal sinus tenderness. Left sinus exhibits no maxillary sinus tenderness and no frontal sinus tenderness.  Mouth/Throat: Uvula is midline and oropharynx is clear and moist. Abnormal dentition. Dental caries present. No oropharyngeal  exudate.  + Eyeglasses  Eyes: Pupils are equal, round, and reactive to light.  Neck: Normal range of motion. Neck supple.  Cardiovascular: Normal rate, regular rhythm and normal heart sounds.  Pulmonary/Chest: Effort normal and breath sounds normal. No accessory muscle usage. No respiratory distress. She has no decreased breath sounds. She has no wheezes. She has no rhonchi.  + Barrel chest  Musculoskeletal: Normal range of motion.        General: No edema.  Lymphadenopathy:    She has no cervical adenopathy.  Neurological: She is alert and oriented to person, place, and time. Gait  normal.  Skin: Skin is warm and dry. She is not diaphoretic. No erythema.  Psychiatric: Mood, memory, affect and judgment normal.  Nursing note and vitals reviewed.     Lab Results:  CBC    Component Value Date/Time   WBC 9.0 03/03/2018 1437   RBC 4.89 03/03/2018 1437   HGB 12.6 03/03/2018 1437   HCT 38.4 03/03/2018 1437   PLT 249.0 03/03/2018 1437   MCV 78.5 03/03/2018 1437   MCH 26.3 10/10/2017 1911   MCHC 32.9 03/03/2018 1437   RDW 16.6 (H) 03/03/2018 1437   LYMPHSABS 1.4 03/03/2018 1437   MONOABS 0.6 03/03/2018 1437   EOSABS 0.0 03/03/2018 1437   BASOSABS 0.0 03/03/2018 1437    BMET    Component Value Date/Time   NA 135 03/03/2018 1437   NA 135 (L) 02/15/2017 1457   K 4.7 03/03/2018 1437   K 4.8 02/15/2017 1457   CL 98 03/03/2018 1437   CO2 30 03/03/2018 1437   CO2 26 02/15/2017 1457   GLUCOSE 222 (H) 03/03/2018 1437   GLUCOSE 98 02/15/2017 1457   GLUCOSE 251 (H) 10/10/2006 1254   BUN 21 03/03/2018 1437   BUN 10.8 02/15/2017 1457   CREATININE 0.70 03/03/2018 1437   CREATININE 0.69 05/06/2017 1405   CREATININE 0.7 02/15/2017 1457   CALCIUM 9.6 03/03/2018 1437   CALCIUM 9.3 02/15/2017 1457   GFRNONAA >60 10/10/2017 1911   GFRNONAA 88 03/22/2015 1213   GFRAA >60 10/10/2017 1911   GFRAA >89 03/22/2015 1213    BNP No results found for: BNP  ProBNP No results found for: PROBNP    Assessment & Plan:    COPD with chronic bronchitis (HCC) Assessment: Barrel chest Lungs clear to auscultation but diminished in the bases Patient reports no clinical improvement with previous inhaler therapies Patient does endorse clinical improvement when taking albuterol Patient has a nebulizer at home Patient still actively smoking 1 pack/day  Plan: DuoNeb nebulized medications have been prescribed for the patient We will have patient not be on maintenance inhalers at this time as she has not seen any clinical improvement and financially they are costly to  her Consider pulmonary rehab referral, patient reports she will think about it Spent a considerable amount of time with the patient today discussing smoking sensation >>> Patient is to start 21 mg patches as well as 4 mg gum to have 2 nicotine replacement agents nightly at 1 time and to set a quit date  Chronic respiratory failure ([place]) Plan: Continue nocturnal oxygen as prescribed  GERD Plan: Continue Dexilant  TOBACCO USE Assessment: Patient continues to smoke 1 pack/day Lung RADS 2 on 2019 low-dose CT screening 51-pack-year smoking history and ongoing  Plan: Patient is willing to retry nicotine patches as well as nicotine gum Patient reports she has nicotine patches at home 21 mg Patient needs to set a quit date  Lauraine Rinne, NP 10/07/2018   This appointment was 32 minutes along with over 50% of the time in direct face-to-face patient care, assessment, plan of care, and follow-up.

## 2018-10-07 NOTE — Patient Instructions (Addendum)
We will start DuoNeb nebulized medications that you can use every 6-8 hours for shortness of breath and wheezing  Can continue use albuterol rescue inhaler every 4-6 hours  Note your daily symptoms > remember "red flags" for Emphysema:   >>>Increase in cough >>>increase in sputum production >>>increase in shortness of breath or activity  intolerance.   If you notice these symptoms, please call the office to be seen.    You need to set a quit date and to stop smoking.  We recommend that you stop smoking.  >>>You need to set a quit date >>>If you have friends or family who smoke, let them know you are trying to quit and not to smoke around you or in your living environment  Smoking Cessation Resources:  1 800 QUIT NOW  >>> Patient to call this resource and utilize it to help support her quit smoking >>> Keep up your hard work with stopping smoking  You can also contact the [facility] >>>For smoking cessation classes call [phone]  We do not recommend using e-cigarettes as a form of stopping smoking  You can sign up for smoking cessation support texts and information:  >>>https://smokefree.gov/smokefreetxt    Nicotine patches: >>>Make sure you rotate sites that you do not get skin irritation, Apply 1 patch each morning to a non-hairy skin site  If you are smoking greater than 10 cigarettes/day and weigh over 45 kg start with the nicotine patch of 21 mg a day for 6 weeks, then 14 mg a day for 2 weeks, then finished with 7 mg a day for 2 weeks, then stop  If you are smoking less than 10 cigarettes a day or weight less than 45 kg start with medium dose pack of 14 mg a day for 6 weeks, followed by 7 mg a day for 2 weeks   >>>If insomnia occurs you are having trouble sleeping you can take the patch off at night, and place a new one on in the morning >>>If the patch is removed at night and you have morning cravings start short acting nicotine replacement therapy  such as gum or lozenges  Nicotine Gum:  >>>Smokers who smoke 25 or more cigarettes a day should use 4 mg dose >>>Smokers who smoke fewer than 25 cigarettes a day should use 2 mg dose  Proper chewing of gum is important for optimal results.   >>>Do not chew gum to rapidly.  Once you start chewing eating tasty peppery taste and slide the gum to your cheek.  When the taste disappears to a few more times.  Repeat this for 30 minutes.  Then discard the gum.  >>>Avoid acidic beverages such as coffee, carbonated beverages before and during gum use.  A soft acidic beverages lower oral pH which cause nicotine to not be absorbed properly >>>If you chew the gum too quickly or vigorously you could have nausea, vomiting, abdominal pain, constipation, hiccups, headache, sore jaw, mouth irritation ulcers   Follow-up with Dr. Halford Chessman in 3 months or sooner if symptoms worsen   It is flu season:   >>>Remember to be washing your hands regularly, using hand sanitizer, be careful to use around herself with has contact with people who are sick will increase her chances of getting sick yourself. >>> Best ways to protect herself from the flu: Receive the yearly flu vaccine, practice good hand hygiene washing with soap and also using hand sanitizer when available, eat a nutritious meals, get adequate rest, hydrate  appropriately   Please contact the office if your symptoms worsen or you have concerns that you are not improving.   Thank you for choosing [facility] for your healthcare, and for allowing [place] to partner with you on your healthcare journey. I am thankful to be able to provide care to you today.   Wyn Quaker FNP-C    Health Risks of Smoking Smoking cigarettes is very bad for your health. Tobacco smoke has over 200 known poisons in it. It contains the poisonous gases nitrogen oxide and carbon monoxide. There are over 60 chemicals in tobacco smoke that cause cancer. Smoking is difficult to quit  because a chemical in tobacco, called nicotine, causes addiction or dependence. When you smoke and inhale, nicotine is absorbed rapidly into the bloodstream through your lungs. Both inhaled and non-inhaled nicotine may be addictive. What are the risks of cigarette smoke? Cigarette smokers have an increased risk of many serious medical problems, including:  Lung cancer.  Lung disease, such as pneumonia, bronchitis, and emphysema.  Chest pain (angina) and heart attack because the heart is not getting enough oxygen.  Heart disease and peripheral blood vessel disease.  High blood pressure (hypertension).  Stroke.  Oral cancer, including cancer of the lip, mouth, or voice box.  Bladder cancer.  Pancreatic cancer.  Cervical cancer.  Pregnancy complications, including premature birth.  Stillbirths and smaller newborn babies, birth defects, and genetic damage to sperm.  Early menopause.  Lower estrogen level for women.  Infertility.  Facial wrinkles.  Blindness.  Increased risk of broken bones (fractures).  Senile dementia.  Stomach ulcers and internal bleeding.  Delayed wound healing and increased risk of complications during surgery.  Even smoking lightly shortens your life expectancy by several years. Because of secondhand smoke exposure, children of smokers have an increased risk of the following:  Sudden infant death syndrome (SIDS).  Respiratory infections.  Lung cancer.  Heart disease.  Ear infections. What are the benefits of quitting? There are many health benefits of quitting smoking. Here are some of them:  Within days of quitting smoking, your risk of having a heart attack decreases, your blood flow improves, and your lung capacity improves. Blood pressure, pulse rate, and breathing patterns start returning to normal soon after quitting.  Within months, your lungs may clear up completely.  Quitting for 10 years reduces your risk of developing lung  cancer and heart disease to almost that of a nonsmoker.  People who quit may see an improvement in their overall quality of life. How do I quit smoking?     Smoking is an addiction with both physical and psychological effects, and longtime habits can be hard to change. Your health care provider can recommend:  Programs and community resources, which may include group support, education, or talk therapy.  Prescription medicines to help reduce cravings.  Nicotine replacement products, such as patches, gum, and nasal sprays. Use these products only as directed. Do not replace cigarette smoking with electronic cigarettes, which are commonly called e-cigarettes. The safety of e-cigarettes is not known, and some may contain harmful chemicals.  A combination of two or more of these methods. Where to find more information  American Lung Association: www.lung.org  American Cancer Society: www.cancer.org Summary  Smoking cigarettes is very bad for your health. Cigarette smokers have an increased risk of many serious medical problems, including several cancers, heart disease, and stroke.  Smoking is an addiction with both physical and psychological effects, and longtime habits can  be hard to change.  By stopping right away, you can greatly reduce the risk of medical problems for you and your family.  To help you quit smoking, your health care provider can recommend programs, community resources, prescription medicines, and nicotine replacement products such as patches, gum, and nasal sprays. This information is not intended to replace advice given to you by your health care provider. Make sure you discuss any questions you have with your health care provider. Document Released: 10/25/2004 Document Revised: 12/19/2017 Document Reviewed: 09/21/2016 Elsevier Interactive Patient Education  2019 [employer].     Steps to Quit Smoking  Smoking tobacco can be bad for your health. It can also  affect almost every organ in your body. Smoking puts you and people around you at risk for many serious long-lasting (chronic) diseases. Quitting smoking is hard, but it is one of the best things that you can do for your health. It is never too late to quit. What are the benefits of quitting smoking? When you quit smoking, you lower your risk for getting serious diseases and conditions. They can include:  Lung cancer or lung disease.  Heart disease.  Stroke.  Heart attack.  Not being able to have children (infertility).  Weak bones (osteoporosis) and broken bones (fractures). If you have coughing, wheezing, and shortness of breath, those symptoms may get better when you quit. You may also get sick less often. If you are pregnant, quitting smoking can help to lower your chances of having a baby of low birth weight. What can I do to help me quit smoking? Talk with your doctor about what can help you quit smoking. Some things you can do (strategies) include:  Quitting smoking totally, instead of slowly cutting back how much you smoke over a period of time.  Going to in-person counseling. You are more likely to quit if you go to many counseling sessions.  Using resources and support systems, such as: ? Database administrator with a Social worker. ? Phone quitlines. ? Careers information officer. ? Support groups or group counseling. ? Text messaging programs. ? Mobile phone apps or applications.  Taking medicines. Some of these medicines may have nicotine in them. If you are pregnant or breastfeeding, do not take any medicines to quit smoking unless your doctor says it is okay. Talk with your doctor about counseling or other things that can help you. Talk with your doctor about using more than one strategy at the same time, such as taking medicines while you are also going to in-person counseling. This can help make quitting easier. What things can I do to make it easier to quit? Quitting smoking  might feel very hard at first, but there is a lot that you can do to make it easier. Take these steps:  Talk to your family and friends. Ask them to support and encourage you.  Call phone quitlines, reach out to support groups, or work with a Social worker.  Ask people who smoke to not smoke around you.  Avoid places that make you want (trigger) to smoke, such as: ? Bars. ? Parties. ? Smoke-break areas at work.  Spend time with people who do not smoke.  Lower the stress in your life. Stress can make you want to smoke. Try these things to help your stress: ? Getting regular exercise. ? Deep-breathing exercises. ? Yoga. ? Meditating. ? Doing a body scan. To do this, close your eyes, focus on one area of your body at a time from  head to toe, and notice which parts of your body are tense. Try to relax the muscles in those areas.  Download or buy apps on your mobile phone or tablet that can help you stick to your quit plan. There are many free apps, such as QuitGuide from the [employer] Office manager for Disease Control and Prevention). You can find more support from smokefree.gov and other websites. This information is not intended to replace advice given to you by your health care provider. Make sure you discuss any questions you have with your health care provider. Document Released: 07/14/2009 Document Revised: 05/15/2016 Document Reviewed: 02/01/2015 Elsevier Interactive Patient Education  2019 [employer].

## 2018-10-07 NOTE — Assessment & Plan Note (Signed)
Assessment: Patient continues to smoke 1 pack/day Lung RADS 2 on 2019 low-dose CT screening 51-pack-year smoking history and ongoing  Plan: Patient is willing to retry nicotine patches as well as nicotine gum Patient reports she has nicotine patches at home 21 mg Patient needs to set a quit date

## 2018-10-16 ENCOUNTER — Other Ambulatory Visit: Payer: Self-pay | Admitting: Cardiology

## 2018-10-28 NOTE — Progress Notes (Signed)
NEUROLOGY FOLLOW UP OFFICE NOTE  Diane [name] [id number]  HISTORY OF PRESENT ILLNESS: Diane [name] is a 63 year old right-handed woman with CAD s/p CABG, hypertension, hyperlipidemia, type 2 diabetes mellitus, tobacco use, depression, fibromyalgia, OSA, COPD and history of stroke who follows up for seizure disorder.  UPDATE: Current medications:  Keppra 1058m twice daily, gabapentin 608mat bedtime (for neck pain)  No spells.  Last spell 03/18/13.  Her main issue is the chronic dizziness.  She feels unsure of herself walking without the walker.  2 months [facility] and [facility].  Blood sugar was okay.  She was not able to check her blood pressure.    HISTORY: 1.  Seizure disorder: [facility] for recurrent seizure-like episodes. These spells are were witnessed by Dr. ChWallie Charneuro-hospitalist, and described as "head turning to the right as well as eye deviation to the right, stiffening of right extremities with flexion of right upper extremity, and convulsive movements of right extremities. Spell lasted about 40-50 seconds and resolved without intervention. Within 30 seconds patient was again awake and conversant. She was slightly confused with respect to time." She is unable to speak during these events. Sometimes she was aware during these events and other times she was unaware and amnestic. She did have urinary incontinence but no tongue biting. It is reported that she can write down what she wants to say during these spells. Given her stroke history, she was evaluated for possible left hemispheric stroke. By his assessment, it seemed that her speech difficulties were non-organic, seizure-like activity non-organic, and he did not suspect acute stroke.   She had continuous EEG monitoring, which captured multiple spells without electrographic correlate.  However, she did have one  captured clinical event, described as "build up to rhythmical theta activity and resultant slowing companied by clinical changes. This may very well represent epileptiform activity. No interictal abnormalities were noted". Psychiatry was consulted, regarding possible pseudoseizures. Despite the possible diagnosis of pseudoseizures, the neuro-hospitalists never made a sole diagnosis of pseudoseizures in the hospital notes.   I personally reviewed the EEG. At 1:57:48, patient pauses, head slightly turned left, staring, with little movement and no convulsions. She does pull at the covers a little, but no appreciable stereotypical hand movements. When it is over, she begins moving around again. Electrographically, she does have a build up of generalized theta activity up until 1:58:26, followed by generalized delta slowing lasting until 1:58:45. Difficult to lateralize, but may be more prominent in the left hemisphere. However, this appears to only be a clip of the event and the rest of the study is not available to me. Therefore I cannot comment on the other captured spells.  She also reports episodes of tremor involving the head, hands and legs.  It is not positional.  It lasts 20 to 30 minutes.  She has had it for many years.  Blood pressure and blood sugar readings are normal.  She says her sister has Parkinson's disease.  MRI of brain performed on 03/03/13, which reported possible tiny acute infarct in the posterior left frontal lobe, but neurology believed this to be artifact. There was also mention of incidental tiny cysts in right hippocampal region. Encephalomalacia in the right frontoparietal region, from prior remote stroke, is seen. MRI of brain without contrast from 12/08/15 showed chronic right MCA infarct but also showed chronic looking lacunar infarcts in the  bilateral basal ganglia and left thalamus, which are new compared to prior imaging from June 2014.  Prior AEDs:  Dilantin ER  140m  2.  Cervical radiculopathy: She has history of right sided neck pain radiating down to the right shoulder, associated with tingling from the right ear down to the shoulder. Hot and cold compresses help too. No numbness, pain or weakness down the arm. Pain worse when turning neck to the left. She has trigger finger and numbness in the fingers when driving or picking up objects, thought to be carpal tunnel syndrome. She has history of cervical fusion in the 1980s. PT was ineffective. .Marland KitchenMRI of the cervical spine performed on 05/23/14 showed moderately large foraminal disc protrusion and associated osteophyte at C3-4, causing right foraminal encroachment. Solid fusion C5 through C7 noted. She was referred for right C4 nerve root injection, however it was not done.   PAST MEDICAL HISTORY: Past Medical History:  Diagnosis Date  . Anemia   . Carotid stenosis    a. s/p right carotid stent 10/2017.  .Marland KitchenChronic systolic CHF (congestive heart failure) ([place]   . Colon polyps   . [place] disease) [place]   . Coronary artery disease    post CABG in 3/07 , coronary stents   . Diabetes mellitus    type 2  . Dyslipidemia   . Fibromyalgia   . GERD (gastroesophageal reflux disease)   . Headache    hx of   . Hyperlipidemia   . Hypertension   . [place]ocardial infarction ([place]   . Pneumonia    hx of   . [name]    last seizure- 03/2013   . Shortness of breath dyspnea    with exertion or when fluid builds up   . Sleep apnea    used to wear a cpap- not used in 3 years   . Status post dilation of esophageal narrowing   . Stroke ([facility]   problems with balance   . Systolic murmur    known mild AS and MR  . Thyroid goiter   . Tobacco abuse     MEDICATIONS: Current Outpatient Medications on File Prior to Visit  Medication Sig Dispense Refill  . acetaminophen (TYLENOL) 500 MG tablet Take 1,000 mg by mouth every 6 (six) hours as needed for moderate pain  or headache.    . albuterol (VENTOLIN HFA) 108 (90 Base) MCG/ACT inhaler INHALER 2 PUFFS EVERY 6 HOURS AS NEEDED FOR WHEEZING/SHORTNESS OF BREATH 1 Inhaler 6  . aspirin EC 81 MG tablet Take 81 mg by mouth every evening.  30 tablet 6  . carvedilol (COREG) 25 MG tablet TAKE 1 TABLET (25 MG TOTAL) BY MOUTH 2 (TWO) TIMES DAILY WITH A MEAL. 180 tablet 3  . cetirizine (ZYRTEC) 10 MG tablet Take 10 mg by mouth daily as needed for allergies.    .Marland Kitchenclopidogrel (PLAVIX) 75 MG tablet TAKE 1 TABLET BY MOUTH EVERY DAY 90 tablet 2  . dexlansoprazole (DEXILANT) 60 MG capsule TAKE 1 CAPSULE (60 MG TOTAL) BY MOUTH DAILY. 30 capsule 11  . diphenhydrAMINE (BENADRYL) 25 mg capsule Take 2 capsules in AM and 1 capsule at night (Patient taking differently: Take 50 mg by mouth 2 (two) times daily as needed for itching or allergies. ) 270 capsule 3  . fluticasone (FLONASE) 50 MCG/ACT nasal spray Place 2 sprays into both nostrils daily. 16 g 11  . furosemide (LASIX) 40 MG tablet TAKE 1 TABLET BY MOUTH  EVERY DAY 90 tablet 3  . gabapentin (NEURONTIN) 300 MG capsule TAKE 2 CAPSULES BY MOUTH EVERY DAY AT BEDTIME 180 capsule 1  . glimepiride (AMARYL) 2 MG tablet Take 2 mg by mouth 2 (two) times daily.  6  . guaiFENesin (MUCINEX) 600 MG 12 hr tablet Take 2 tablets (1,200 mg total) by mouth 2 (two) times daily. 360 tablet 3  . hydrochlorothiazide (HYDRODIURIL) 50 MG tablet Take 25 mg by mouth daily.    [name] ipratropium-albuterol (DUONEB) 0.5-2.5 (3) MG/3ML SOLN Take 3 mLs (1 vial) every 6 hours as needed for shortness of breath and/ or wheezing 360 mL 3  . isosorbide mononitrate (IMDUR) 30 MG 24 hr tablet TAKE 1 TABLET BY MOUTH EVERY DAY 90 tablet 2  . KLOR-CON M10 10 MEQ tablet TAKE 1 TABLET BY MOUTH TWICE A DAY 180 tablet 3  . levETIRAcetam (KEPPRA) 1000 MG tablet TAKE 1 TABLET BY MOUTH TWICE A DAY 180 tablet 1  . losartan (COZAAR) 100 MG tablet Take 0.5 tablets (50 mg total) by mouth daily. 90 tablet 3  . metFORMIN (GLUCOPHAGE-XR)  500 MG 24 hr tablet Take 500 mg by mouth 2 (two) times daily.  6  . nicotine polacrilex (NICORETTE) 4 MG gum Take 1 each (4 mg total) by mouth as needed for smoking cessation. 90 tablet 2  . nitroGLYCERIN (NITROLINGUAL) 0.4 MG/SPRAY spray PLACE 1 SPRAY UNDER THE TONGUE EVERY 5 (FIVE) MINUTES X 3 DOSES AS NEEDED FOR CHEST PAIN. 12 g 3  . ONE TOUCH ULTRA TEST test strip USE AS DIRECTED FOR TESTING BLOOD GLUCOSE 3 TIMES DAILY  1  . oxyCODONE (OXY IR/ROXICODONE) 5 MG immediate release tablet Take 1-2 tablets (5-10 mg total) by mouth every 4 (four) hours as needed for moderate pain. 6 tablet 0  . polyethylene glycol powder (GLYCOLAX/MIRALAX) powder TAKE 1 DOSE 17 GRAMS IN 8 OZ OF WATER DAILY AS NEEDED FOR CONSTIPATION. 527 g 3  . promethazine-dextromethorphan (PROMETHAZINE-DM) 6.25-15 MG/5ML syrup Take 5 mLs by mouth 4 (four) times daily as needed for cough. 118 mL 0  . ranolazine (RANEXA) 1000 MG SR tablet TAKE 1 TABLET BY MOUTH TWICE A DAY 180 tablet 1  . rosuvastatin (CRESTOR) 10 MG tablet TAKE 1 TABLET BY MOUTH EVERY DAY 90 tablet 2  . sodium chloride (OCEAN) 0.65 % SOLN nasal spray Place 1 spray into both nostrils as needed for congestion.     No current facility-administered medications on file prior to visit.     ALLERGIES: Allergies  Allergen Reactions  . Bee Venom Itching and Swelling  . Entresto [Sacubitril-Valsartan] Cough  . Tetracycline     Unknown   . Ace Inhibitors Cough    FAMILY HISTORY: Family History  Problem Relation Age of Onset  . Heart disease Mother   . Diabetes Mother   . COPD Mother   . Hyperlipidemia Mother   . Hypertension Mother   . Cancer Father        met, origin unknown  . Heart attack Father   . Drug abuse Paternal Grandmother   . Stroke Paternal Grandfather   . Lung cancer Paternal Aunt        lung with mets to brain  . Melanoma Paternal Uncle   . Lung cancer Paternal Uncle        lung/liver to brain  . Cancer Paternal Uncle        cancer of  unknown type  . Heart disease Maternal Grandfather   . Hypertension Sister   .  Cancer Sister        eyelid  . Glaucoma Sister   . Heart disease Maternal Aunt        x 2 aunts   SOCIAL HISTORY: Social History   Socioeconomic History  . Marital status: Divorced    Spouse name: Not on file  . Number of children: 0  . Years of education: Not on file  . Highest education level: Not on file  Occupational History  . Occupation: disabled  Social Needs  . Financial resource strain: Not on file  . Food insecurity:    Worry: Not on file    Inability: Not on file  . Transportation needs:    Medical: Not on file    Non-medical: Not on file  Tobacco Use  . Smoking status: Current Every Day Smoker    Packs/day: 1.00    Years: 51.00    Pack years: 51.00    Types: Cigarettes  . Smokeless tobacco: Never Used  . Tobacco comment: 1.5 pack to 2 packs per day. Has been working on reducing the # of cigarettes she smokes, down to 1/2 PPD  Substance and Sexual Activity  . Alcohol use: No    Alcohol/week: 1.0 standard drinks    Types: 1 Standard drinks or equivalent per week  . Drug use: No  . Sexual activity: Not Currently    Partners: Male    Birth control/protection: None  Lifestyle  . Physical activity:    Days per week: Not on file    Minutes per session: Not on file  . Stress: Not on file  Relationships  . Social connections:    Talks on phone: Not on file    Gets together: Not on file    Attends religious service: Not on file    Active member of club or organization: Not on file    Attends meetings of clubs or organizations: Not on file    Relationship status: Not on file  . Intimate partner violence:    Fear of current or ex partner: Not on file    Emotionally abused: Not on file    Physically abused: Not on file    Forced sexual activity: Not on file  Other Topics Concern  . Not on file  Social History Narrative  . Not on file    REVIEW OF SYSTEMS: Constitutional:  No fevers, chills, or sweats, no generalized fatigue, change in appetite Eyes: No visual changes, double vision, eye pain Ear, nose and throat: No hearing loss, ear pain, nasal congestion, sore throat Cardiovascular: No chest pain, palpitations Respiratory:  No shortness of breath at rest or with exertion, wheezes GastrointestinaI: No nausea, vomiting, diarrhea, abdominal pain, fecal incontinence Genitourinary:  No dysuria, urinary retention or frequency Musculoskeletal:  Neck pain Integumentary: No rash, pruritus, skin lesions Neurological: as above Psychiatric: No depression, insomnia, anxiety Endocrine: No palpitations, fatigue, diaphoresis, mood swings, change in appetite, change in weight, increased thirst Hematologic/Lymphatic:  No purpura, petechiae. Allergic/Immunologic: no itchy/runny eyes, nasal congestion, recent allergic reactions, rashes  PHYSICAL EXAM: Blood pressure (!) 110/56, pulse 67, height 4' 8.25" (1.429 m), weight 142 lb (64.4 kg), last menstrual period 09/02/2003, SpO2 97 %. General: No acute distress.  Patient appears well-groomed.   Head:  Normocephalic/atraumatic Eyes:  Fundi examined but not visualized Neck: supple, no paraspinal tenderness, full range of motion Heart:  Regular rate and rhythm Lungs:  Clear to auscultation bilaterally Back: No paraspinal tenderness Neurological Exam: alert and oriented to person, place, and  time. Attention span and concentration intact, recent and remote memory intact, fund of knowledge intact.  Speech fluent and not dysarthric, language intact.  CN II-XII intact. Bulk and tone normal, muscle strength 5/5 throughout.  Sensation to light touch  intact.  Deep tendon reflexes 2+ throughout.  Finger to nose testing intact.  Gait unsteady.  Romberg negative.  IMPRESSION: 1.  Seizure disorder, stable 2.  Cervical radiculopathy, stable 3.  Tobacco use  PLAN: 1.  Keppra 1088m twice daily and gabapentin 6047mat bedtime 2.  Tobacco  cessation counseling (CPT 99406):  Tobacco use with history of stroke  - Currently smoking 1 packs/day   - Patient was informed of the dangers of tobacco abuse including stroke, cancer, and MI, as well as benefits of tobacco cessation. - Patient is willing to quit at this time. - Approximately 4 mins were spent counseling patient cessation techniques. We discussed various methods to help quit smoking, including deciding on a date to quit, joining a support group, pharmacological agents- nicotine gum/patch/lozenges, chantix.  - I will reassess her progress at the next follow-up visit 3.  Follow up in one year.  20 minutes spent face to face with patient, over 50% spent discussing management.  AdMetta ClinesDO  CC: MaLoura PardonMD

## 2018-10-29 ENCOUNTER — Encounter: Payer: Self-pay | Admitting: Neurology

## 2018-10-29 ENCOUNTER — Other Ambulatory Visit: Payer: Self-pay | Admitting: Cardiology

## 2018-10-29 ENCOUNTER — Ambulatory Visit (INDEPENDENT_AMBULATORY_CARE_PROVIDER_SITE_OTHER): Payer: Medicare Other | Admitting: Neurology

## 2018-10-29 VITALS — BP 110/56 | HR 67 | Ht <= 58 in | Wt 142.0 lb

## 2018-10-29 DIAGNOSIS — F17200 Nicotine dependence, unspecified, uncomplicated: Secondary | ICD-10-CM | POA: Diagnosis not present

## 2018-10-29 DIAGNOSIS — G40009 Localization-related (focal) (partial) idiopathic epilepsy and epileptic syndromes with seizures of localized onset, not intractable, without status epilepticus: Secondary | ICD-10-CM | POA: Diagnosis not present

## 2018-10-29 DIAGNOSIS — F17210 Nicotine dependence, cigarettes, uncomplicated: Secondary | ICD-10-CM | POA: Diagnosis not present

## 2018-10-29 NOTE — Patient Instructions (Addendum)
1.  Continue Keppra 1000mg  twice daily and gabapentin 600mg  at bedtime 2.  Follow up in one year

## 2018-11-03 LAB — HEMOGLOBIN A1C: Hemoglobin A1C: 6.7

## 2018-11-25 DIAGNOSIS — E7800 Pure hypercholesterolemia, unspecified: Secondary | ICD-10-CM | POA: Diagnosis not present

## 2018-11-25 DIAGNOSIS — E041 Nontoxic single thyroid nodule: Secondary | ICD-10-CM | POA: Diagnosis not present

## 2018-11-25 DIAGNOSIS — E1121 Type 2 diabetes mellitus with diabetic nephropathy: Secondary | ICD-10-CM | POA: Diagnosis not present

## 2018-11-25 DIAGNOSIS — E119 Type 2 diabetes mellitus without complications: Secondary | ICD-10-CM | POA: Diagnosis not present

## 2018-11-25 DIAGNOSIS — E538 Deficiency of other specified B group vitamins: Secondary | ICD-10-CM | POA: Diagnosis not present

## 2018-12-02 DIAGNOSIS — E7800 Pure hypercholesterolemia, unspecified: Secondary | ICD-10-CM | POA: Diagnosis not present

## 2018-12-02 DIAGNOSIS — E1121 Type 2 diabetes mellitus with diabetic nephropathy: Secondary | ICD-10-CM | POA: Diagnosis not present

## 2018-12-02 DIAGNOSIS — E119 Type 2 diabetes mellitus without complications: Secondary | ICD-10-CM | POA: Diagnosis not present

## 2018-12-02 DIAGNOSIS — I509 Heart failure, unspecified: Secondary | ICD-10-CM | POA: Diagnosis not present

## 2018-12-02 DIAGNOSIS — E041 Nontoxic single thyroid nodule: Secondary | ICD-10-CM | POA: Diagnosis not present

## 2018-12-02 DIAGNOSIS — I10 Essential (primary) hypertension: Secondary | ICD-10-CM | POA: Diagnosis not present

## 2018-12-05 ENCOUNTER — Encounter: Payer: Self-pay | Admitting: Family Medicine

## 2018-12-05 ENCOUNTER — Ambulatory Visit (INDEPENDENT_AMBULATORY_CARE_PROVIDER_SITE_OTHER): Payer: Medicare Other | Admitting: Family Medicine

## 2018-12-05 ENCOUNTER — Ambulatory Visit: Payer: Medicare Other | Admitting: Family Medicine

## 2018-12-05 VITALS — BP 112/64 | HR 69 | Temp 97.4°F | Ht <= 58 in | Wt 140.6 lb

## 2018-12-05 DIAGNOSIS — Z794 Long term (current) use of insulin: Secondary | ICD-10-CM

## 2018-12-05 DIAGNOSIS — F17200 Nicotine dependence, unspecified, uncomplicated: Secondary | ICD-10-CM

## 2018-12-05 DIAGNOSIS — R5382 Chronic fatigue, unspecified: Secondary | ICD-10-CM

## 2018-12-05 DIAGNOSIS — I739 Peripheral vascular disease, unspecified: Secondary | ICD-10-CM | POA: Diagnosis not present

## 2018-12-05 DIAGNOSIS — R5383 Other fatigue: Secondary | ICD-10-CM | POA: Insufficient documentation

## 2018-12-05 DIAGNOSIS — J449 Chronic obstructive pulmonary disease, unspecified: Secondary | ICD-10-CM

## 2018-12-05 DIAGNOSIS — E7800 Pure hypercholesterolemia, unspecified: Secondary | ICD-10-CM | POA: Diagnosis not present

## 2018-12-05 DIAGNOSIS — I10 Essential (primary) hypertension: Secondary | ICD-10-CM

## 2018-12-05 DIAGNOSIS — E538 Deficiency of other specified B group vitamins: Secondary | ICD-10-CM | POA: Diagnosis not present

## 2018-12-05 DIAGNOSIS — I5042 Chronic combined systolic (congestive) and diastolic (congestive) heart failure: Secondary | ICD-10-CM | POA: Diagnosis not present

## 2018-12-05 DIAGNOSIS — E1142 Type 2 diabetes mellitus with diabetic polyneuropathy: Secondary | ICD-10-CM

## 2018-12-05 DIAGNOSIS — I779 Disorder of arteries and arterioles, unspecified: Secondary | ICD-10-CM

## 2018-12-05 LAB — CBC WITH DIFFERENTIAL/PLATELET
Basophils Absolute: 0.1 10*3/uL (ref 0.0–0.1)
Basophils Relative: 0.5 % (ref 0.0–3.0)
Eosinophils Absolute: 0 10*3/uL (ref 0.0–0.7)
Eosinophils Relative: 0.3 % (ref 0.0–5.0)
HCT: 31.1 % — ABNORMAL LOW (ref 36.0–46.0)
Hemoglobin: 9.9 g/dL — ABNORMAL LOW (ref 12.0–15.0)
Lymphocytes Relative: 21.1 % (ref 12.0–46.0)
Lymphs Abs: 2.6 10*3/uL (ref 0.7–4.0)
MCHC: 31.9 g/dL (ref 30.0–36.0)
MCV: 69.4 fl — ABNORMAL LOW (ref 78.0–100.0)
Monocytes Absolute: 0.6 10*3/uL (ref 0.1–1.0)
Monocytes Relative: 4.8 % (ref 3.0–12.0)
Neutro Abs: 8.9 10*3/uL — ABNORMAL HIGH (ref 1.4–7.7)
Neutrophils Relative %: 73.3 % (ref 43.0–77.0)
Platelets: 239 10*3/uL (ref 150.0–400.0)
RBC: 4.48 Mil/uL (ref 3.87–5.11)
RDW: 18.2 % — ABNORMAL HIGH (ref 11.5–15.5)
WBC: 12.2 10*3/uL — ABNORMAL HIGH (ref 4.0–10.5)

## 2018-12-05 NOTE — Patient Instructions (Addendum)
Let's check your cbc for anemia  Exam and other labs are re assuring  I wonder if your copd or vascular disease are starting to wear you down  Keep trying to quit smoking !  If labs are normal I recommend follow up with cardiology

## 2018-12-05 NOTE — Progress Notes (Signed)
Subjective:    Patient ID: Diane [name], female    DOB: 09-13-1956, 63 y.o.   MRN: [id number]  HPI  Here to discuss labs from Dr Chalmers Cater  She is more and more fatigued since December  She is used to going all the time / staying very busy and getting out and care taking   Now good if she gets out 1-2 times per week   Wt Readings from Last 3 Encounters:  12/05/18 140 lb 9 oz (63.8 kg)  10/29/18 142 lb (64.4 kg)  10/07/18 140 lb 6.4 oz (63.7 kg)  stable  31.23 kg/m   bp is stable today  No cp or palpitations or headaches or edema  No side effects to medicines  BP Readings from Last 3 Encounters:  12/05/18 112/64  10/29/18 (!) 110/56  10/07/18 126/72      Lab Results  Component Value Date   VITAMINB12 309 06/03/2018  B12 level in feb 429 From Dr Chalmers Cater Chem labs good   Cholesterol  LDL was 58  HDL 45 Trig 160   TSH 1.5   A1c in 6 range   Lab Results  Component Value Date   WBC 9.0 03/03/2018   HGB 12.6 03/03/2018   HCT 38.4 03/03/2018   MCV 78.5 03/03/2018   PLT 249.0 03/03/2018    Keeps appt with pulmonary  Pt thinks she is not feel more sob   No change in med  Dr Martinique cut a cardiac medicine back  No change in seizure medicine  Hx of CAD/vasc dz/cva and copd   More headaches recently  Cold all the time   Weak all over [place] [employer]  Last MRI head was 2017   Not depressed  Gets pleasure every day   Smoking-cut down to 1ppd  Is working with pulmonary    Patient Active Problem List   Diagnosis Date Noted  . Fatigue 12/05/2018  . Estrogen deficiency 04/21/2018  . Screening mammogram, encounter for 04/21/2018  . Osteopenia 04/21/2018  . History of HPV infection 04/21/2018  . Dizzy spells 03/03/2018  . Right hip pain 02/07/2018  . Ischemic cardiomyopathy 01/21/2018  . Other constipation 05/07/2017  . RLQ abdominal pain 05/07/2017  . Colon cancer screening 04/19/2017  . Incisional hernia 01/14/2017  . Smokers' cough ([place]) 08/13/2016    . Chronic respiratory failure ([place]) 07/11/2016  . COPD with chronic bronchitis ([place]) 05/20/2016  . Sleep-related hypoventilation due to lower airway obstruction 05/20/2016  . Periodic limb movements of sleep 05/20/2016  . CHF (congestive heart failure) ([place]) 05/08/2016  . Type 2 diabetes mellitus with diabetic polyneuropathy, with long-term current use of insulin ([place]) 12/13/2015  . B12 deficiency 12/01/2015  . History of CVA (cerebrovascular accident) 11/29/2015  . Abnormal nuclear stress test 05/06/2015  . 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 of localized onset, not intractable, without status epilepticus ([place]) 03/22/2015  . Cervical disc disorder with radiculopathy of cervical region 03/22/2015  . Complex partial seizures ([place]) 09/16/2013  . Encounter for Medicare annual wellness exam 06/16/2013  . Antiplatelet or antithrombotic long-term use 06/08/2013  . Seizures ([place]) 03/25/2013  . Goiter 09/01/2012  . Other screening mammogram 06/13/2012  . Allergic rhinitis 06/13/2012  . Carotid artery disease ([place]) 08/20/2011  . DIZZINESS 07/26/2010  . Obstructive sleep apnea 09/18/2007  . TOBACCO USE 08/08/2007  . Hyperlipidemia 03/26/2007  . ANXIETY 03/26/2007  . DEPRESSION 03/26/2007  . MIGRAINE HEADACHE 03/26/2007  . CARPAL TUNNEL SYNDROME, BILATERAL 03/26/2007  .  Essential hypertension 03/26/2007  . Hx of CABG 03/26/2007  . GERD 03/26/2007  . Claudication in peripheral vascular disease ([place]) 03/26/2007   Past Medical History:  Diagnosis Date  . Anemia   . Carotid stenosis    a. s/p right carotid stent 10/2017.  [name] Chronic systolic CHF (congestive heart failure) ([place])   . Colon polyps   . COPD (chronic obstructive pulmonary disease) ([place])   . Coronary artery disease    post CABG in 3/07 , coronary stents   . Diabetes mellitus    type 2  . Dyslipidemia   . Fibromyalgia   . GERD (gastroesophageal reflux disease)   . Headache    hx of    . Hyperlipidemia   . Hypertension   . Myocardial infarction ([place])   . Pneumonia    hx of   . Seizures ([place])    last seizure- 03/2013   . Shortness of breath dyspnea    with exertion or when fluid builds up   . Sleep apnea    used to wear a cpap- not used in 3 years   . Status post dilation of esophageal narrowing   . Stroke [facility]) 1993   problems with balance   . Systolic murmur    known mild AS and MR  . Thyroid goiter   . Tobacco abuse    Past Surgical History:  Procedure Laterality Date  . APPENDECTOMY    . BREAST BIOPSY Left 2016  . CARDIAC CATHETERIZATION  11/29/05   EF of 55%  . CARDIAC CATHETERIZATION  08/06/06   EF of 45-50%  . CARDIAC CATHETERIZATION N/A 05/06/2015   Procedure: Right/Left Heart Cath and Coronary/Graft Angiography;  Surgeon: Peter M Martinique, MD;  Location: [place] [facility];  Service: Cardiovascular;  Laterality: N/A;  . CAROTID PTA/STENT INTERVENTION N/A 10/10/2017   Procedure: CAROTID PTA/STENT INTERVENTION - Right;  Surgeon: Serafina Mitchell, MD;  Location: [place] [facility];  Service: Cardiovascular;  Laterality: N/A;  . CERVICAL FUSION  1990  . CHOLECYSTECTOMY    . COLON RESECTION     mass removed and 4 in of colon  . COLONOSCOPY WITH PROPOFOL N/A 07/16/2017   Procedure: COLONOSCOPY WITH PROPOFOL;  Surgeon: Ladene Artist, MD;  Location: [facility];  Service: Endoscopy;  Laterality: N/A;  . CORONARY ARTERY BYPASS GRAFT  12/04/2005   x5 -- left internal mammary artery to the LAD, left radial artery to the ramus intermedius, saphenous vein graft to the obtuse marginal 1, sequential saphenous vein grat to the acute marginal and posterior descending, endoscopic vein harvesting from the left thigh with open vein harvest from right leg  . CORONARY STENT PLACEMENT  08/11/06   PCI of her ciurcumflex/OM vessel  . LAPAROTOMY Bilateral 05/19/2015   Procedure: EXPLORATORY LAPAROTOMY WITH BILATERAL SALPINGO OOPHORECTOMY /OMENTECTOMY/SEGMENTAL SIGMOID COLECTOMY  ;  Surgeon: Everitt Amber, MD;  Location: [facility];  Service: Gynecology;  Laterality: Bilateral;  . PERIPHERAL VASCULAR CATHETERIZATION N/A 11/15/2015   Procedure: Carotid PTA/Stent Intervention;  Surgeon: Lorretta Harp, MD;  Location: [place] [facility];  Service: Cardiovascular;  Laterality: N/A;  . PERIPHERAL VASCULAR CATHETERIZATION  11/15/2015   Procedure: Carotid Angiography;  Surgeon: Lorretta Harp, MD;  Location: [place] [facility];  Service: Cardiovascular;;   Social History   Tobacco Use  . Smoking status: Current Every Day Smoker    Packs/day: 1.00    Years: 51.00    Pack years: 51.00    Types: Cigarettes  . Smokeless  tobacco: Never Used  . Tobacco comment: 1.5 pack to 2 packs per day. Has been working on reducing the # of cigarettes she smokes, down to 1/2 PPD  Substance Use Topics  . Alcohol use: No  . Drug use: No   Family History  Problem Relation Age of Onset  . Heart disease Mother   . Diabetes Mother   . COPD Mother   . Hyperlipidemia Mother   . Hypertension Mother   . Cancer Father        met, origin unknown  . Heart attack Father   . Drug abuse Paternal Grandmother   . Stroke Paternal Grandfather   . Lung cancer Paternal Aunt        lung with mets to brain  . Melanoma Paternal Uncle   . Lung cancer Paternal Uncle        lung/liver to brain  . Cancer Paternal Uncle        cancer of unknown type  . Heart disease Maternal Grandfather   . Hypertension Sister   . Cancer Sister        eyelid  . Glaucoma Sister   . Heart disease Maternal Aunt        x 2 aunts   Allergies  Allergen Reactions  . Bee Venom Itching and Swelling  . Entresto [Sacubitril-Valsartan] Cough  . Tetracycline     Unknown   . Ace Inhibitors Cough   Current Outpatient Medications on File Prior to Visit  Medication Sig Dispense Refill  . acetaminophen (TYLENOL) 500 MG tablet Take 1,000 mg by mouth every 6 (six) hours as needed for moderate pain or headache.    . albuterol  (VENTOLIN HFA) 108 (90 Base) MCG/ACT inhaler INHALER 2 PUFFS EVERY 6 HOURS AS NEEDED FOR WHEEZING/SHORTNESS OF BREATH 1 Inhaler 6  . aspirin EC 81 MG tablet Take 81 mg by mouth every evening.  30 tablet 6  . carvedilol (COREG) 25 MG tablet TAKE 1 TABLET (25 MG TOTAL) BY MOUTH 2 (TWO) TIMES DAILY WITH A MEAL. 180 tablet 3  . cetirizine (ZYRTEC) 10 MG tablet Take 10 mg by mouth daily as needed for allergies.    [name] clopidogrel (PLAVIX) 75 MG tablet TAKE 1 TABLET BY MOUTH EVERY DAY 90 tablet 2  . dexlansoprazole (DEXILANT) 60 MG capsule TAKE 1 CAPSULE (60 MG TOTAL) BY MOUTH DAILY. 30 capsule 11  . diphenhydrAMINE (BENADRYL) 25 mg capsule Take 2 capsules in AM and 1 capsule at night (Patient taking differently: Take 50 mg by mouth 2 (two) times daily as needed for itching or allergies. ) 270 capsule 3  . fluticasone (FLONASE) 50 MCG/ACT nasal spray Place 2 sprays into both nostrils daily. 16 g 11  . furosemide (LASIX) 40 MG tablet TAKE 1 TABLET BY MOUTH EVERY DAY 90 tablet 3  . gabapentin (NEURONTIN) 300 MG capsule TAKE 2 CAPSULES BY MOUTH EVERY DAY AT BEDTIME 180 capsule 1  . glimepiride (AMARYL) 2 MG tablet Take 2 mg by mouth 2 (two) times daily.  6  . guaiFENesin (MUCINEX) 600 MG 12 hr tablet Take 2 tablets (1,200 mg total) by mouth 2 (two) times daily. 360 tablet 3  . hydrochlorothiazide (HYDRODIURIL) 50 MG tablet TAKE 1 TABLET BY MOUTH EVERY DAY 90 tablet 3  . ipratropium-albuterol (DUONEB) 0.5-2.5 (3) MG/3ML SOLN Take 3 mLs (1 vial) every 6 hours as needed for shortness of breath and/ or wheezing 360 mL 3  . isosorbide mononitrate (IMDUR) 30 MG 24 hr tablet TAKE  1 TABLET BY MOUTH EVERY DAY 90 tablet 2  . KLOR-CON M10 10 MEQ tablet TAKE 1 TABLET BY MOUTH TWICE A DAY 180 tablet 3  . levETIRAcetam (KEPPRA) 1000 MG tablet TAKE 1 TABLET BY MOUTH TWICE A DAY 180 tablet 1  . losartan (COZAAR) 100 MG tablet Take 0.5 tablets (50 mg total) by mouth daily. 90 tablet 3  . metFORMIN (GLUCOPHAGE-XR) 500 MG 24 hr  tablet Take 500 mg by mouth 2 (two) times daily.  6  . nicotine polacrilex (NICORETTE) 4 MG gum Take 1 each (4 mg total) by mouth as needed for smoking cessation. 90 tablet 2  . nitroGLYCERIN (NITROLINGUAL) 0.4 MG/SPRAY spray PLACE 1 SPRAY UNDER THE TONGUE EVERY 5 (FIVE) MINUTES X 3 DOSES AS NEEDED FOR CHEST PAIN. 12 g 3  . ONE TOUCH ULTRA TEST test strip USE AS DIRECTED FOR TESTING BLOOD GLUCOSE 3 TIMES DAILY  1  . oxyCODONE (OXY IR/ROXICODONE) 5 MG immediate release tablet Take 1-2 tablets (5-10 mg total) by mouth every 4 (four) hours as needed for moderate pain. 6 tablet 0  . polyethylene glycol powder (GLYCOLAX/MIRALAX) powder TAKE 1 DOSE 17 GRAMS IN 8 OZ OF WATER DAILY AS NEEDED FOR CONSTIPATION. 527 g 3  . ranolazine (RANEXA) 1000 MG SR tablet TAKE 1 TABLET BY MOUTH TWICE A DAY 180 tablet 1  . rosuvastatin (CRESTOR) 10 MG tablet TAKE 1 TABLET BY MOUTH EVERY DAY 90 tablet 2  . sodium chloride (OCEAN) 0.65 % SOLN nasal spray Place 1 spray into both nostrils as needed for congestion.     No current facility-administered medications on file prior to visit.     Review of Systems  Constitutional: Positive for fatigue. Negative for activity change, appetite change, fever and unexpected weight change.  HENT: Negative for congestion, ear pain, rhinorrhea, sinus pressure and sore throat.   Eyes: Negative for pain, redness and visual disturbance.  Respiratory: Positive for cough and shortness of breath. Negative for wheezing.   Cardiovascular: Negative for chest pain and palpitations.  Gastrointestinal: Negative for abdominal pain, blood in stool, constipation and diarrhea.  Endocrine: Negative for polydipsia and polyuria.  Genitourinary: Negative for dysuria, frequency, hematuria and urgency.  Musculoskeletal: Negative for arthralgias, back pain, joint swelling and myalgias.  Skin: Negative for pallor and rash.  Allergic/Immunologic: Negative for environmental allergies.  Neurological: Positive  for weakness. Negative for dizziness, syncope, light-headedness and headaches.  Hematological: Negative for adenopathy. Does not bruise/bleed easily.  Psychiatric/Behavioral: Negative for decreased concentration, dysphoric mood and sleep disturbance. The patient is not nervous/anxious.        Objective:   Physical Exam Constitutional:      General: She is not in acute distress.    Appearance: Normal appearance. She is well-developed. She is obese. She is not ill-appearing or diaphoretic.  HENT:     Head: Normocephalic and atraumatic.     Right Ear: Tympanic membrane and ear canal normal.     Left Ear: Tympanic membrane and ear canal normal.     Nose: Nose normal.     Mouth/Throat:     Mouth: Mucous membranes are moist.     Pharynx: Oropharynx is clear.  Eyes:     General: No scleral icterus.    Conjunctiva/sclera: Conjunctivae normal.     Pupils: Pupils are equal, round, and reactive to light.  Neck:     Musculoskeletal: Normal range of motion and neck supple. No muscular tenderness.     Thyroid: No thyromegaly.  Vascular: No JVD.  Cardiovascular:     Rate and Rhythm: Normal rate and regular rhythm.     Pulses: Normal pulses.     Heart sounds: Normal heart sounds. No gallop.   Pulmonary:     Effort: Pulmonary effort is normal. No respiratory distress.     Breath sounds: Wheezing present. No rales.     Comments: Diffusely distant bs Scant wheeze on forced exp Abdominal:     General: Bowel sounds are normal. There is no distension or abdominal bruit.     Palpations: Abdomen is soft. There is no mass.     Tenderness: There is no abdominal tenderness.  Musculoskeletal:     Right lower leg: No edema.     Left lower leg: No edema.  Lymphadenopathy:     Cervical: No cervical adenopathy.  Skin:    General: Skin is warm and dry.     Coloration: Skin is not pale.     Findings: No erythema or rash.  Neurological:     Mental Status: She is alert. Mental status is at baseline.      Coordination: Coordination normal.     Deep Tendon Reflexes: Reflexes are normal and symmetric. Reflexes normal.     Comments: Mild tremor   Psychiatric:        Mood and Affect: Mood normal.        Speech: Speech normal.        Behavior: Behavior normal.        Thought Content: Thought content normal.     Comments: Pleasant            Assessment & Plan:   Problem List Items Addressed This Visit      Cardiovascular and Mediastinum   Essential hypertension - Primary    bp in fair control at this time  BP Readings from Last 1 Encounters:  12/05/18 112/64   No changes needed Most recent labs reviewed  Disc lifstyle change with low sodium diet and exercise          Respiratory   COPD with chronic bronchitis ([place])    Continues to smoke  Rev tx plan from pulmonary This is progressive and may add to her worsening fatigue        Endocrine   Type 2 diabetes mellitus with diabetic polyneuropathy, with long-term current use of insulin (HCC)    Sees Dr Chalmers Cater and A1C is in the 6 range Doing well         Other   Hyperlipidemia    Controlled with statin in setting of vascular dz and DM      TOBACCO USE    Disc in detail risks of smoking and possible outcomes including copd, vascular/ heart disease, cancer , respiratory and sinus infections  Pt voices understanding Pt is working with pulmonary to quit but still struggles  Has copd and vascular dz      B12 deficiency    Level improved to 429 at Dr Almetta Lovely office  Continues supplementation       Fatigue    I suspect this is multifactorial  Since december Pt states mood and sleep are ok overall  Rev last lab work from [place] and endo  Lab for cbc today  Suspect chronic medical problems including vascular dz and copd are progressing and causing some of the fatigue Enc smoking cessation and activity as tolerated Continue pulm and cardio f/u       Relevant Orders   CBC with  Differential/Platelet (Completed)      Other Visit Diagnoses    Chronic combined systolic and diastolic heart failure (HCC)   (Chronic)     Carotid artery disease, unspecified laterality (HCC)   (Chronic)

## 2018-12-07 NOTE — Assessment & Plan Note (Signed)
Level improved to 429 at Dr Almetta Lovely office  [place]

## 2018-12-07 NOTE — Assessment & Plan Note (Signed)
I suspect this is multifactorial  Since december Pt states mood and sleep are ok overall  Rev last lab work from [place] and endo  Lab for cbc today  Suspect chronic medical problems including vascular dz and copd are progressing and causing some of the fatigue Enc smoking cessation and activity as tolerated Continue pulm and cardio f/u

## 2018-12-07 NOTE — Assessment & Plan Note (Signed)
Sees Dr Chalmers Cater and A1C is in the 6 range Doing well

## 2018-12-07 NOTE — Assessment & Plan Note (Signed)
Disc in detail risks of smoking and possible outcomes including copd, vascular/ heart disease, cancer , respiratory and sinus infections  Pt voices understanding Pt is working with pulmonary to quit but still struggles  Has copd and vascular dz

## 2018-12-07 NOTE — Assessment & Plan Note (Signed)
Continues to smoke  Rev tx plan from pulmonary This is progressive and may add to her worsening fatigue

## 2018-12-07 NOTE — Assessment & Plan Note (Signed)
bp in fair control at this time  BP Readings from Last 1 Encounters:  12/05/18 112/64   No changes needed Most recent labs reviewed  Disc lifstyle change with low sodium diet and exercise

## 2018-12-07 NOTE — Assessment & Plan Note (Signed)
Controlled with statin in setting of vascular dz and DM

## 2018-12-12 ENCOUNTER — Telehealth: Payer: Self-pay | Admitting: Family Medicine

## 2018-12-12 NOTE — Telephone Encounter (Signed)
Pt called to get results from 12/05/18 labs. She is requesting a call back.

## 2018-12-12 NOTE — Telephone Encounter (Signed)
She is anemic compared to last time (it may be iron deficiency) Please get her ifob kit to do  When that is done start iron  Then send in nu iron 150 mg 1 po bid #[id number] ref to start (advise stool softener to avoid constipation if needed)   Her iron may also be due to chronic disease Wbc was a little up - that can come from inflammation or infection or stress Alert [place] if any signs of illness or uti   Re check labs 2 weeks after stopping iron I will want to add a ferritin and path eval to the cbc Thanks  Hopefully tx this will help fatigue

## 2018-12-12 NOTE — Telephone Encounter (Signed)
Pt notified of lab results and Dr. Marliss Coots comments. I will have Terri mail her a ifob Monday and once we get the results will send in iron and schedule appt

## 2018-12-15 NOTE — Telephone Encounter (Signed)
Terri mailed ifob, I will wait for the results before proceeding with Dr. Marliss Coots instructions

## 2018-12-16 ENCOUNTER — Other Ambulatory Visit: Payer: Self-pay | Admitting: Pulmonary Disease

## 2018-12-16 DIAGNOSIS — J439 Emphysema, unspecified: Secondary | ICD-10-CM

## 2018-12-22 ENCOUNTER — Other Ambulatory Visit (INDEPENDENT_AMBULATORY_CARE_PROVIDER_SITE_OTHER): Payer: Medicare Other

## 2018-12-22 ENCOUNTER — Other Ambulatory Visit: Payer: Self-pay | Admitting: Family Medicine

## 2018-12-22 ENCOUNTER — Telehealth: Payer: Self-pay | Admitting: Gastroenterology

## 2018-12-22 ENCOUNTER — Telehealth: Payer: Self-pay | Admitting: Radiology

## 2018-12-22 DIAGNOSIS — R195 Other fecal abnormalities: Secondary | ICD-10-CM | POA: Insufficient documentation

## 2018-12-22 DIAGNOSIS — D649 Anemia, unspecified: Secondary | ICD-10-CM | POA: Insufficient documentation

## 2018-12-22 DIAGNOSIS — Z1211 Encounter for screening for malignant neoplasm of colon: Secondary | ICD-10-CM | POA: Diagnosis not present

## 2018-12-22 DIAGNOSIS — D509 Iron deficiency anemia, unspecified: Secondary | ICD-10-CM

## 2018-12-22 LAB — FECAL OCCULT BLOOD, IMMUNOCHEMICAL: Fecal Occult Bld: POSITIVE — AB

## 2018-12-22 MED ORDER — POLYSACCHARIDE IRON COMPLEX 150 MG PO CAPS: 150.0000 mg | ORAL_CAPSULE | Freq: Two times a day (BID) | ORAL | 3 refills | Status: DC

## 2018-12-22 NOTE — Telephone Encounter (Signed)
[facility] called a critical ifob - POSITIVE, results given to Dr Glori Bickers

## 2018-12-22 NOTE — Telephone Encounter (Signed)
Pt pcp offc [facility] called to sched an urgent appt for pt due to a Postive IFOB. Please advised on sched. thanks

## 2018-12-22 NOTE — Telephone Encounter (Signed)
Rx for iron sent to pharmacy, per Dr. Glori Bickers since Ifob was positive we are not going to recheck labs she is going to be referred to GI

## 2018-12-22 NOTE — Addendum Note (Signed)
Addended by: Tammi Sou on: 12/22/2018 04:22 PM   Modules accepted: Orders

## 2018-12-22 NOTE — Telephone Encounter (Signed)
Called patient and [facility]. They will Triage the call and call the patient directly.

## 2018-12-22 NOTE — Telephone Encounter (Signed)
Her ifob is positive (stool test)  I want to get her in with GI in light of this in the setting of anemia  Does she have a practice she prefers?   We will have to see what the status is with ref with covid as well  Thanks

## 2018-12-22 NOTE — Telephone Encounter (Signed)
Patient should be offered a phone visit this week

## 2018-12-22 NOTE — Telephone Encounter (Signed)
Pt notified of Ifob results. Pt said she has seen Dr. Fuller Plan in the past and would like to be referred back to him or his office, please put referral in and I advised pt our [facility] will call to schedule appt

## 2018-12-23 NOTE — Telephone Encounter (Signed)
PT was sched for 4.1.2020 3:30pm with dr.Stark for phone visit

## 2018-12-26 ENCOUNTER — Telehealth (HOSPITAL_COMMUNITY): Payer: Self-pay | Admitting: *Deleted

## 2018-12-26 ENCOUNTER — Telehealth (HOSPITAL_COMMUNITY): Payer: Self-pay

## 2018-12-26 NOTE — Telephone Encounter (Signed)
Pt insurance is active and benefits verified through Medicare A/B. Co-pay $0.00, DED $198.00/$198.00 met, out of pocket $0.00/$0.00 met, co-insurance 20%. No pre-authorization required. 12/26/2018 @ 9:57AM

## 2018-12-26 NOTE — Telephone Encounter (Signed)
Received referral for pt to participate in pulmonary rehab by Dr. Halford Chessman for pulmonary emphysema.  Pt seen in the office on 1/720 by Wyn Quaker NP with Dr. Halford Chessman. Pt has upcoming appt with Dr. Fuller Plan on 12/31/18 (tele visit) for new gi consult for positive occult blood with associated anemia.  Pt will see Dr.Jordan on 4/2/2- and Dr. Halford Chessman on 01/07/19.  Will hold scheduling PR amid departmental closure due to Covid-19 and the completion of her upcoming appt.  Pt called and message left for return call. Contact information provided. Cherre Huger, BSN Cardiac and Training and development officer

## 2018-12-31 ENCOUNTER — Telehealth: Payer: Medicare Other | Admitting: Gastroenterology

## 2019-01-01 ENCOUNTER — Encounter: Payer: Self-pay | Admitting: Pulmonary Disease

## 2019-01-01 ENCOUNTER — Ambulatory Visit: Payer: Medicare Other | Admitting: Cardiology

## 2019-01-01 ENCOUNTER — Other Ambulatory Visit: Payer: Self-pay

## 2019-01-01 ENCOUNTER — Ambulatory Visit (INDEPENDENT_AMBULATORY_CARE_PROVIDER_SITE_OTHER): Payer: Medicare Other | Admitting: Cardiology

## 2019-01-01 ENCOUNTER — Encounter: Payer: Self-pay | Admitting: Cardiology

## 2019-01-01 VITALS — BP 92/40 | HR 66 | Ht <= 58 in | Wt 140.0 lb

## 2019-01-01 DIAGNOSIS — I25708 Atherosclerosis of coronary artery bypass graft(s), unspecified, with other forms of angina pectoris: Secondary | ICD-10-CM | POA: Diagnosis not present

## 2019-01-01 DIAGNOSIS — I5042 Chronic combined systolic (congestive) and diastolic (congestive) heart failure: Secondary | ICD-10-CM | POA: Diagnosis not present

## 2019-01-01 DIAGNOSIS — I6521 Occlusion and stenosis of right carotid artery: Secondary | ICD-10-CM

## 2019-01-01 DIAGNOSIS — I10 Essential (primary) hypertension: Secondary | ICD-10-CM | POA: Diagnosis not present

## 2019-01-01 DIAGNOSIS — I255 Ischemic cardiomyopathy: Secondary | ICD-10-CM

## 2019-01-01 MED ORDER — LOSARTAN POTASSIUM 50 MG PO TABS: 50.0000 mg | ORAL_TABLET | Freq: Every day | ORAL | 3 refills | Status: DC

## 2019-01-01 MED ORDER — CARVEDILOL 12.5 MG PO TABS: 12.5000 mg | ORAL_TABLET | Freq: Two times a day (BID) | ORAL | 3 refills | Status: DC

## 2019-01-01 NOTE — Patient Instructions (Signed)
Reduce Coreg to 12.5 mg twice a day  If GI needs to do endoscopy you may hold Plavix for one week before.

## 2019-01-01 NOTE — Progress Notes (Signed)
Diane [name] Date of Birth: 05/17/1956 Medical Record #[id number]  History of Present Illness: Diane [name] is seen for follow up CAD. She has a history of coronary disease and is status post CABG in March of 2007 by Dr. [name]. She presented later that year with recurrent angina. Repeat cardiac cath showed patent LIMA to the LAD but all other grafts occluded including SVG to OM, SVG to AC/PL, and radial graft to ramus intermediate. She then had complex stenting of the mid LCx and first OM with Taxus stents. The native RCA was occluded with collaterals.   In 2016 she was evaluated for abdominal surgery. Myoview abnormal. Cardiac cath as noted above but now stents in LCx and OM occluded. No suitable targets for PCI or surgery. Severe LV dysfunction with moderate pulmonary HTN and normal LV filling pressures. She has multiple cardiac risk factors including diabetes, dyslipidemia, hypertension, and tobacco abuse. She also has  bilateral carotid arterial disease. She continues to smoke.   In August 2016 she had removal of a large pelvic mass with BSO and sigmoid colectomy. Path c/w ovarian cystadenoma. No complications.   She is followed by Dr. Gwenlyn Found for her carotid disease. Angiogram performed with findings of 70% bilateral stenoses. The LICA was heavily calcified. Both arteries had acute angulation after the stenosis making it less favorable for stenting. In March she developed dizziness, lightheadedness, and imbalance. Dr. Glori Bickers ordered an MRI that showed an old right MCA infarct and chronic lacunar infarcts that were new from 2014.   On a prior visit with me we switched her losartan to [facility]. She developed a persistent cough with clear phlegm. [name] was stopped and losartan resumed- cough resolved.  CXR showed no active disease.   She previously complained of pain in her thighs bilaterally. LE dopplers shows good ABIs of 0.93 bilaterally. Mild right iliac stenosis. Moderate right femoral and  SFA disease. Seen by Dr. Gwenlyn Found and medical therapy recommended. This past fall carotid dopplers showed progression of right carotid disease. She was not a candidate for CEA so underwent right carotid stenting with good results.   She reports she had a bad fall 6 months ago while standing in a [employer] shop. Legs just gave way and she fell.  She reports now that she is feeling progressively weaker and more unsteady. Has to use a walker now. Legs get weak and she gives out walking across her house. Has occasional chest pain that has not changed. Occasional heart skipping. She is chronically SOB. No edema. Last A1c 6.7 %. Feels cold. She was recently diagnosed with anemia. Hgb 9.9. FOBT positive. Started on iron. GI evaluation pending.   Current Outpatient Medications on File Prior to Visit  Medication Sig Dispense Refill  . acetaminophen (TYLENOL) 500 MG tablet Take 1,000 mg by mouth every 6 (six) hours as needed for moderate pain or headache.    . albuterol (VENTOLIN HFA) 108 (90 Base) MCG/ACT inhaler INHALER 2 PUFFS EVERY 6 HOURS AS NEEDED FOR WHEEZING/SHORTNESS OF BREATH 1 Inhaler 6  . aspirin EC 81 MG tablet Take 81 mg by mouth every evening.  30 tablet 6  . cetirizine (ZYRTEC) 10 MG tablet Take 10 mg by mouth daily as needed for allergies.    [name] clopidogrel (PLAVIX) 75 MG tablet TAKE 1 TABLET BY MOUTH EVERY DAY 90 tablet 2  . dexlansoprazole (DEXILANT) 60 MG capsule TAKE 1 CAPSULE (60 MG TOTAL) BY MOUTH DAILY. 30 capsule 11  . diphenhydrAMINE (BENADRYL) 25 mg capsule  Take 2 capsules in AM and 1 capsule at night (Patient taking differently: Take 50 mg by mouth 2 (two) times daily as needed for itching or allergies. ) 270 capsule 3  . fluticasone (FLONASE) 50 MCG/ACT nasal spray Place 2 sprays into both nostrils daily. 16 g 11  . furosemide (LASIX) 40 MG tablet TAKE 1 TABLET BY MOUTH EVERY DAY 90 tablet 3  . gabapentin (NEURONTIN) 300 MG capsule TAKE 2 CAPSULES BY MOUTH EVERY DAY AT BEDTIME 180  capsule 1  . glimepiride (AMARYL) 2 MG tablet Take 2 mg by mouth 2 (two) times daily.  6  . guaiFENesin (MUCINEX) 600 MG 12 hr tablet Take 2 tablets (1,200 mg total) by mouth 2 (two) times daily. 360 tablet 3  . hydrochlorothiazide (HYDRODIURIL) 50 MG tablet TAKE 1 TABLET BY MOUTH EVERY DAY 90 tablet 3  . ipratropium-albuterol (DUONEB) 0.5-2.5 (3) MG/3ML SOLN Take 3 mLs (1 vial) every 6 hours as needed for shortness of breath and/ or wheezing 360 mL 3  . iron polysaccharides (NU-IRON) 150 MG capsule Take 1 capsule (150 mg total) by mouth 2 (two) times daily. 60 capsule 3  . isosorbide mononitrate (IMDUR) 30 MG 24 hr tablet TAKE 1 TABLET BY MOUTH EVERY DAY 90 tablet 2  . KLOR-CON M10 10 MEQ tablet TAKE 1 TABLET BY MOUTH TWICE A DAY 180 tablet 3  . levETIRAcetam (KEPPRA) 1000 MG tablet TAKE 1 TABLET BY MOUTH TWICE A DAY 180 tablet 1  . metFORMIN (GLUCOPHAGE-XR) 500 MG 24 hr tablet Take 500 mg by mouth 2 (two) times daily.  6  . nicotine polacrilex (NICORETTE) 4 MG gum Take 1 each (4 mg total) by mouth as needed for smoking cessation. 90 tablet 2  . nitroGLYCERIN (NITROLINGUAL) 0.4 MG/SPRAY spray PLACE 1 SPRAY UNDER THE TONGUE EVERY 5 (FIVE) MINUTES X 3 DOSES AS NEEDED FOR CHEST PAIN. 12 g 3  . ONE TOUCH ULTRA TEST test strip USE AS DIRECTED FOR TESTING BLOOD GLUCOSE 3 TIMES DAILY  1  . oxyCODONE (OXY IR/ROXICODONE) 5 MG immediate release tablet Take 1-2 tablets (5-10 mg total) by mouth every 4 (four) hours as needed for moderate pain. 6 tablet 0  . polyethylene glycol powder (GLYCOLAX/MIRALAX) powder TAKE 1 DOSE 17 GRAMS IN 8 OZ OF WATER DAILY AS NEEDED FOR CONSTIPATION. 527 g 3  . ranolazine (RANEXA) 1000 MG SR tablet TAKE 1 TABLET BY MOUTH TWICE A DAY 180 tablet 1  . rosuvastatin (CRESTOR) 10 MG tablet TAKE 1 TABLET BY MOUTH EVERY DAY 90 tablet 2  . sodium chloride (OCEAN) 0.65 % SOLN nasal spray Place 1 spray into both nostrils as needed for congestion.     No current facility-administered  medications on file prior to visit.     Allergies  Allergen Reactions  . Bee Venom Itching and Swelling  . Entresto [Sacubitril-Valsartan] Cough  . Tetracycline     Unknown   . Ace Inhibitors Cough    Past Medical History:  Diagnosis Date  . Anemia   . Carotid stenosis    a. s/p right carotid stent 10/2017.  [name] Chronic systolic CHF (congestive heart failure) ([place])   . Colon polyps   . COPD (chronic obstructive pulmonary disease) ([place])   . Coronary artery disease    post CABG in 3/07 , coronary stents   . Diabetes mellitus    type 2  . Dyslipidemia   . Fibromyalgia   . GERD (gastroesophageal reflux disease)   . Headache  hx of   . Hyperlipidemia   . Hypertension   . Myocardial infarction ([place])   . Pneumonia    hx of   . Seizures ([place])    last seizure- 03/2013   . Shortness of breath dyspnea    with exertion or when fluid builds up   . Sleep apnea    used to wear a cpap- not used in 3 years   . Status post dilation of esophageal narrowing   . Stroke [facility]) 1993   problems with balance   . Systolic murmur    known mild AS and MR  . Thyroid goiter   . Tobacco abuse     Past Surgical History:  Procedure Laterality Date  . APPENDECTOMY    . BREAST BIOPSY Left 2016  . CARDIAC CATHETERIZATION  11/29/05   EF of 55%  . CARDIAC CATHETERIZATION  08/06/06   EF of 45-50%  . CARDIAC CATHETERIZATION N/A 05/06/2015   Procedure: Right/Left Heart Cath and Coronary/Graft Angiography;  Surgeon: Lilliane Sposito M Martinique, MD;  Location: [place] [facility];  Service: Cardiovascular;  Laterality: N/A;  . CAROTID PTA/STENT INTERVENTION N/A 10/10/2017   Procedure: CAROTID PTA/STENT INTERVENTION - Right;  Surgeon: Serafina Mitchell, MD;  Location: [place] [facility];  Service: Cardiovascular;  Laterality: N/A;  . CERVICAL FUSION  1990  . CHOLECYSTECTOMY    . COLON RESECTION     mass removed and 4 in of colon  . COLONOSCOPY WITH PROPOFOL N/A 07/16/2017   Procedure: COLONOSCOPY WITH PROPOFOL;   Surgeon: Ladene Artist, MD;  Location: [facility];  Service: Endoscopy;  Laterality: N/A;  . CORONARY ARTERY BYPASS GRAFT  12/04/2005   x5 -- left internal mammary artery to the LAD, left radial artery to the ramus intermedius, saphenous vein graft to the obtuse marginal 1, sequential saphenous vein grat to the acute marginal and posterior descending, endoscopic vein harvesting from the left thigh with open vein harvest from right leg  . CORONARY STENT PLACEMENT  08/11/06   PCI of her ciurcumflex/OM vessel  . LAPAROTOMY Bilateral 05/19/2015   Procedure: EXPLORATORY LAPAROTOMY WITH BILATERAL SALPINGO OOPHORECTOMY /OMENTECTOMY/SEGMENTAL SIGMOID COLECTOMY ;  Surgeon: Everitt Amber, MD;  Location: [facility];  Service: Gynecology;  Laterality: Bilateral;  . PERIPHERAL VASCULAR CATHETERIZATION N/A 11/15/2015   Procedure: Carotid PTA/Stent Intervention;  Surgeon: Lorretta Harp, MD;  Location: [place] [facility];  Service: Cardiovascular;  Laterality: N/A;  . PERIPHERAL VASCULAR CATHETERIZATION  11/15/2015   Procedure: Carotid Angiography;  Surgeon: Lorretta Harp, MD;  Location: [place] [facility];  Service: Cardiovascular;;    Social History   Tobacco Use  Smoking Status Current Every Day Smoker  . Packs/day: 1.00  . Years: 51.00  . Pack years: 51.00  . Types: Cigarettes  Smokeless Tobacco Never Used  Tobacco Comment   1.5 pack to 2 packs per day. Has been working on reducing the # of cigarettes she smokes, down to 1/2 PPD    Social History   Substance and Sexual Activity  Alcohol Use No    Family History  Problem Relation Age of Onset  . Heart disease Mother   . Diabetes Mother   . COPD Mother   . Hyperlipidemia Mother   . Hypertension Mother   . Cancer Father        met, origin unknown  . Heart attack Father   . Drug abuse Paternal Grandmother   . Stroke Paternal Grandfather   . Lung cancer Paternal Aunt  lung with mets to brain  . Melanoma Paternal Uncle   . Lung  cancer Paternal Uncle        lung/liver to brain  . Cancer Paternal Uncle        cancer of unknown type  . Heart disease Maternal Grandfather   . Hypertension Sister   . Cancer Sister        eyelid  . Glaucoma Sister   . Heart disease Maternal Aunt        x 2 aunts    Review of Systems: As noted in history of present illness.  All other systems were reviewed and are negative.  Physical Exam: BP (!) 92/40 (BP Location: Left Arm, Patient Position: Sitting, Cuff Size: Normal)   Pulse 66   Ht '4\' 9"'  (1.448 m)   Wt 140 lb (63.5 kg)   LMP 09/02/2003   BMI 30.30 kg/m  GENERAL:  Well appearing WF in NAD HEENT:  PERRL, EOMI, sclera are clear. Oropharynx is clear. NECK:  No jugular venous distention, bilateral carotid bruits L>R, no thyromegaly or adenopathy LUNGS:  Clear to auscultation bilaterally CHEST:  Unremarkable HEART:  RRR,  PMI not displaced or sustained,S1 and S2 within normal limits, no S3, no S4: no clicks, no rubs, gr 2/6 systolic murmur RUSB.  ABD:  Soft, nontender. BS +, no masses or bruits. No hepatomegaly, no splenomegaly EXT:  1+ pulses throughout, no edema, no cyanosis no clubbing SKIN:  Warm and dry.  No rashes NEURO:  Alert and oriented x 3. Cranial nerves II through XII intact. PSYCH:  Cognitively intact    LABORATORY DATA: Lab Results  Component Value Date   WBC 12.2 (H) 12/05/2018   HGB 9.9 (L) 12/05/2018   HCT 31.1 (L) 12/05/2018   PLT 239.0 12/05/2018   GLUCOSE 222 (H) 03/03/2018   CHOL 146 04/14/2018   TRIG 149.0 04/14/2018   HDL 44.90 04/14/2018   LDLDIRECT 76.0 04/11/2017   LDLCALC 71 04/14/2018   ALT 10 03/03/2018   AST 7 03/03/2018   NA 135 03/03/2018   K 4.7 03/03/2018   CL 98 03/03/2018   CREATININE 0.70 03/03/2018   BUN 21 03/03/2018   CO2 30 03/03/2018   TSH 1.64 04/14/2018   INR 1.05 11/10/2015   HGBA1C 8.0 (H) 06/09/2014   MICROALBUR 1.9 06/09/2013   Ecg today shows NSR with rate 66. Old inferior MI. T wave abnormality c/w  lateral ischemia. Unchanged from prior. I have personally reviewed and interpreted this study.  Assessment / Plan: 1. CAD S/p CABG. Failed bypass grafts except for LIMA to LAD. S/p remote stenting of LCX and OM with Taxus DES now occluded. Stable class 2 angina. Really unchanged from before. Continue aggressive antianginal Rx. With ASA, plavix, nitrates, coreg, and Ranexa. Encourage  smoking cessation.  2. Carotid arterial disese- s/p right carotid stenting. Followed by Dr. Gwenlyn Found. On chronic ASA/Plavix 3. Tobacco abuse. Recommend complete cessation.  4. HTN controlled. 5. Chronic combined systolic and diastolic CHF. Severe LV dysfunction. She appears to be well compensated today without edema. Weight stable.  I am once again concerned that she is getting low BP at times. Will reduce Coreg to 12.5  mg twice daily. Sodium restriction.  6. Pulmonary HTN. 7. S/p major abdominal surgery for cystadenoma. Now with CT showing evidence of incisional hernia. Poor candidate for general anesthesia. Conservative management.  8. Lacunar infarcts.  9. PAD. Dopplers in May showed no significant obstruction.  10. Iron deficiency anemia with FOBT positive.  Discussed GI evaluation. If EGD planned can hold Plavix for one week. I think we should continue for now due to high risk with her carotid disease and prior stenting.  I will follow up in 6 months.

## 2019-01-04 ENCOUNTER — Other Ambulatory Visit: Payer: Self-pay | Admitting: Pulmonary Disease

## 2019-01-04 DIAGNOSIS — J449 Chronic obstructive pulmonary disease, unspecified: Secondary | ICD-10-CM

## 2019-01-04 DIAGNOSIS — F17210 Nicotine dependence, cigarettes, uncomplicated: Secondary | ICD-10-CM

## 2019-01-07 ENCOUNTER — Ambulatory Visit: Payer: Medicare Other | Admitting: Pulmonary Disease

## 2019-01-19 ENCOUNTER — Telehealth: Payer: Self-pay | Admitting: Family Medicine

## 2019-01-19 NOTE — Telephone Encounter (Signed)
We will call her since our schedules are so different with pivot to telemedinine.  [name] please offer her a sooner appointment.

## 2019-01-19 NOTE — Telephone Encounter (Signed)
It looks like she had a phone visit on 4/1 that was cancelled and re scheduled for 5/1  I would like to get repeat labs on her for cbc and ferritin now that she is taking her iron (please set up a drive thru lab visit)  How is she feeling? / any better on iron ?   Let's see how labs look first and then I will decide if plan needs to be changed  I will cc Dr Fuller Plan who is scheduled to see her for anemia with pos stool test

## 2019-01-19 NOTE — Telephone Encounter (Signed)
Diane [name], [facility] called. Patient continues to be weak and tired.  Patient had a positive hemoccult and was to see GI the first of April and it was cancelled.  Patient wasn't rescheduled until 01/30/19.  Diane [name] wants to know if Dr.Tower feels patient should be seen sooner thank 01/30/19.

## 2019-01-19 NOTE — Telephone Encounter (Signed)
I would love it if you could see her earlier, thanks! Do you want me to have Rosaria Ferries make her an appt.?

## 2019-01-19 NOTE — Telephone Encounter (Signed)
Roque Lias,  We can offer her an appointment before 5/1 if you or she would like. We have plenty of availability at this time.  Norberto Sorenson

## 2019-01-19 NOTE — Telephone Encounter (Signed)
See Dr. Lynne Leader comment, do you still want me to schedule lab appt or just have her f/u with GI for an earlier appt

## 2019-01-28 ENCOUNTER — Other Ambulatory Visit: Payer: Self-pay

## 2019-01-29 ENCOUNTER — Encounter: Payer: Self-pay | Admitting: Gastroenterology

## 2019-01-29 ENCOUNTER — Other Ambulatory Visit: Payer: Self-pay

## 2019-01-29 ENCOUNTER — Telehealth: Payer: Self-pay

## 2019-01-29 ENCOUNTER — Ambulatory Visit (INDEPENDENT_AMBULATORY_CARE_PROVIDER_SITE_OTHER): Payer: Medicare Other | Admitting: Gastroenterology

## 2019-01-29 VITALS — Ht <= 58 in | Wt 140.0 lb

## 2019-01-29 DIAGNOSIS — D509 Iron deficiency anemia, unspecified: Secondary | ICD-10-CM | POA: Diagnosis not present

## 2019-01-29 DIAGNOSIS — I255 Ischemic cardiomyopathy: Secondary | ICD-10-CM | POA: Diagnosis not present

## 2019-01-29 DIAGNOSIS — R195 Other fecal abnormalities: Secondary | ICD-10-CM

## 2019-01-29 DIAGNOSIS — Z7902 Long term (current) use of antithrombotics/antiplatelets: Secondary | ICD-10-CM

## 2019-01-29 DIAGNOSIS — R1013 Epigastric pain: Secondary | ICD-10-CM

## 2019-01-29 NOTE — Patient Instructions (Signed)
You have been scheduled for an endoscopy. Please follow written instructions given to you at your visit today. If you use inhalers (even only as needed), please bring them with you on the day of your procedure.  Went over in detail Upper Endoscopy instructions with patient over telephone. Informed patient to continue to hold Plavix until after procedure unless Cardiology responds differently. Patient verbalized understanding.

## 2019-01-29 NOTE — H&P (View-Only) (Signed)
History of Present Illness: This is a 63 year old female with Hemoccult positive stool and a new iron deficiency anemia.  Hemoglobin 9.9, MCV 69.  She relates worsening epigastric pain and a fair appetite over the past few months.  She states her energy level has been low with fatigue and generalized weakness for months.  She takes MiraLAX on a daily basis for management of constipation which works well.  Colonoscopy performed about a year and a half ago as below.  No other gastrointestinal complaints.  Her reflux symptoms are well controlled on Dexilant.  Cardiology office visit with Dr. Martinique on April 2 reviewed.  Denies weight loss, diarrhea, change in stool caliber, melena, hematochezia, nausea, vomiting, dysphagia, chest pain.  Colonoscopy 08/2017 adequate prep, complete exam - Patent end-to-side colo-colonic anastomosis, characterized by healthy appearing mucosa. - Internal hemorrhoids. - The examination was otherwise normal on direct and retroflexion views. - No specimens collected.   Allergies  Allergen Reactions  . Bee Venom Itching and Swelling  . Entresto [Sacubitril-Valsartan] Cough  . Tetracycline     Unknown   . Ace Inhibitors Cough   Outpatient Medications Prior to Visit  Medication Sig Dispense Refill  . acetaminophen (TYLENOL) 500 MG tablet Take 1,000 mg by mouth every 6 (six) hours as needed for moderate pain or headache.    . albuterol (VENTOLIN HFA) 108 (90 Base) MCG/ACT inhaler INHALER 2 PUFFS EVERY 6 HOURS AS NEEDED FOR WHEEZING/SHORTNESS OF BREATH 1 Inhaler 6  . aspirin EC 81 MG tablet Take 81 mg by mouth every evening.  30 tablet 6  . carvedilol (COREG) 12.5 MG tablet Take 1 tablet (12.5 mg total) by mouth daily. 180 tablet 3  . cetirizine (ZYRTEC) 10 MG tablet Take 10 mg by mouth daily as needed for allergies.    [name] clopidogrel (PLAVIX) 75 MG tablet TAKE 1 TABLET BY MOUTH EVERY DAY 90 tablet 2  . dexlansoprazole (DEXILANT) 60 MG capsule TAKE 1 CAPSULE (60 MG  TOTAL) BY MOUTH DAILY. 30 capsule 11  . diphenhydrAMINE (BENADRYL) 25 mg capsule Take 2 capsules in AM and 1 capsule at night (Patient taking differently: Take 50 mg by mouth 2 (two) times daily as needed for itching or allergies. ) 270 capsule 3  . fluticasone (FLONASE) 50 MCG/ACT nasal spray Place 2 sprays into both nostrils daily. 16 g 11  . furosemide (LASIX) 40 MG tablet TAKE 1 TABLET BY MOUTH EVERY DAY 90 tablet 3  . gabapentin (NEURONTIN) 300 MG capsule TAKE 2 CAPSULES BY MOUTH EVERY DAY AT BEDTIME 180 capsule 1  . glimepiride (AMARYL) 2 MG tablet Take 2 mg by mouth 2 (two) times daily.  6  . guaiFENesin (MUCINEX) 600 MG 12 hr tablet Take 2 tablets (1,200 mg total) by mouth 2 (two) times daily. 360 tablet 3  . hydrochlorothiazide (HYDRODIURIL) 50 MG tablet TAKE 1 TABLET BY MOUTH EVERY DAY 90 tablet 3  . iron polysaccharides (NU-IRON) 150 MG capsule Take 1 capsule (150 mg total) by mouth 2 (two) times daily. 60 capsule 3  . isosorbide mononitrate (IMDUR) 30 MG 24 hr tablet TAKE 1 TABLET BY MOUTH EVERY DAY 90 tablet 2  . KLOR-CON M10 10 MEQ tablet TAKE 1 TABLET BY MOUTH TWICE A DAY 180 tablet 3  . levETIRAcetam (KEPPRA) 1000 MG tablet TAKE 1 TABLET BY MOUTH TWICE A DAY 180 tablet 1  . losartan (COZAAR) 50 MG tablet Take 1 tablet (50 mg total) by mouth daily. 90 tablet 3  . metFORMIN (  GLUCOPHAGE-XR) 500 MG 24 hr tablet Take 500 mg by mouth 2 (two) times daily.  6  . nicotine polacrilex (NICORETTE) 4 MG gum Take 1 each (4 mg total) by mouth as needed for smoking cessation. 90 tablet 2  . nitroGLYCERIN (NITROLINGUAL) 0.4 MG/SPRAY spray PLACE 1 SPRAY UNDER THE TONGUE EVERY 5 (FIVE) MINUTES X 3 DOSES AS NEEDED FOR CHEST PAIN. 12 g 3  . ONE TOUCH ULTRA TEST test strip USE AS DIRECTED FOR TESTING BLOOD GLUCOSE 3 TIMES DAILY  1  . polyethylene glycol powder (GLYCOLAX/MIRALAX) powder TAKE 1 DOSE 17 GRAMS IN 8 OZ OF WATER DAILY AS NEEDED FOR CONSTIPATION. 527 g 3  . ranolazine (RANEXA) 1000 MG SR tablet  TAKE 1 TABLET BY MOUTH TWICE A DAY 180 tablet 1  . rosuvastatin (CRESTOR) 10 MG tablet TAKE 1 TABLET BY MOUTH EVERY DAY 90 tablet 2  . sodium chloride (OCEAN) 0.65 % SOLN nasal spray Place 1 spray into both nostrils as needed for congestion.     No facility-administered medications prior to visit.    Past Medical History:  Diagnosis Date  . Anemia   . Carotid stenosis    a. s/p right carotid stent 10/2017.  [name] Chronic systolic CHF (congestive heart failure) ([place])   . Colon polyps   . COPD (chronic obstructive pulmonary disease) ([place])   . Coronary artery disease    post CABG in 3/07 , coronary stents   . Diabetes mellitus    type 2  . Dyslipidemia   . Fibromyalgia   . GERD (gastroesophageal reflux disease)   . Headache    hx of   . Hyperlipidemia   . Hypertension   . Myocardial infarction ([place])   . Pneumonia    hx of   . Seizures ([place])    last seizure- 03/2013   . Shortness of breath dyspnea    with exertion or when fluid builds up   . Sleep apnea    used to wear a cpap- not used in 3 years   . Status post dilation of esophageal narrowing   . Stroke [facility]) 1993   problems with balance   . Systolic murmur    known mild AS and MR  . Thyroid goiter   . Tobacco abuse    Past Surgical History:  Procedure Laterality Date  . APPENDECTOMY    . BREAST BIOPSY Left 2016  . CARDIAC CATHETERIZATION  11/29/05   EF of 55%  . CARDIAC CATHETERIZATION  08/06/06   EF of 45-50%  . CARDIAC CATHETERIZATION N/A 05/06/2015   Procedure: Right/Left Heart Cath and Coronary/Graft Angiography;  Surgeon: Peter M Martinique, MD;  Location: [facility];  Service: Cardiovascular;  Laterality: N/A;  . CAROTID PTA/STENT INTERVENTION N/A 10/10/2017   Procedure: CAROTID PTA/STENT INTERVENTION - Right;  Surgeon: Serafina Mitchell, MD;  Location: [place] [facility];  Service: Cardiovascular;  Laterality: N/A;  . CERVICAL FUSION  1990  . CHOLECYSTECTOMY    . COLON RESECTION     mass removed and 4 in of colon   . COLONOSCOPY WITH PROPOFOL N/A 07/16/2017   Procedure: COLONOSCOPY WITH PROPOFOL;  Surgeon: Ladene Artist, MD;  Location: [facility];  Service: Endoscopy;  Laterality: N/A;  . CORONARY ARTERY BYPASS GRAFT  12/04/2005   x5 -- left internal mammary artery to the LAD, left radial artery to the ramus intermedius, saphenous vein graft to the obtuse marginal 1, sequential saphenous vein grat to the acute marginal and posterior descending,  endoscopic vein harvesting from the left thigh with open vein harvest from right leg  . CORONARY STENT PLACEMENT  08/11/06   PCI of her ciurcumflex/OM vessel  . LAPAROTOMY Bilateral 05/19/2015   Procedure: EXPLORATORY LAPAROTOMY WITH BILATERAL SALPINGO OOPHORECTOMY /OMENTECTOMY/SEGMENTAL SIGMOID COLECTOMY ;  Surgeon: Everitt Amber, MD;  Location: [facility];  Service: Gynecology;  Laterality: Bilateral;  . PERIPHERAL VASCULAR CATHETERIZATION N/A 11/15/2015   Procedure: Carotid PTA/Stent Intervention;  Surgeon: Lorretta Harp, MD;  Location: [facility];  Service: Cardiovascular;  Laterality: N/A;  . PERIPHERAL VASCULAR CATHETERIZATION  11/15/2015   Procedure: Carotid Angiography;  Surgeon: Lorretta Harp, MD;  Location: [facility];  Service: Cardiovascular;;   Social History   Socioeconomic History  . Marital status: Divorced    Spouse name: Not on file  . Number of children: 0  . Years of education: Not on file  . Highest education level: Not on file  Occupational History  . Occupation: disabled  Social Needs  . Financial resource strain: Not on file  . Food insecurity:    Worry: Not on file    Inability: Not on file  . Transportation needs:    Medical: Not on file    Non-medical: Not on file  Tobacco Use  . Smoking status: Current Every Day Smoker    Packs/day: 1.00    Years: 51.00    Pack years: 51.00    Types: Cigarettes  . Smokeless tobacco: Never Used  . Tobacco comment: 1.5 pack to 2 packs per day. Has been working on reducing  the # of cigarettes she smokes, down to 1/2 PPD  Substance and Sexual Activity  . Alcohol use: No  . Drug use: No  . Sexual activity: Not Currently    Partners: Male    Birth control/protection: None  Lifestyle  . Physical activity:    Days per week: Not on file    Minutes per session: Not on file  . Stress: Not on file  Relationships  . Social connections:    Talks on phone: Not on file    Gets together: Not on file    Attends religious service: Not on file    Active member of club or organization: Not on file    Attends meetings of clubs or organizations: Not on file    Relationship status: Not on file  Other Topics Concern  . Not on file  Social History Narrative  . Not on file   Family History  Problem Relation Age of Onset  . Heart disease Mother   . Diabetes Mother   . COPD Mother   . Hyperlipidemia Mother   . Hypertension Mother   . Cancer Father        met, origin unknown  . Heart attack Father   . Drug abuse Paternal Grandmother   . Stroke Paternal Grandfather   . Lung cancer Paternal Aunt        lung with mets to brain  . Melanoma Paternal Uncle   . Lung cancer Paternal Uncle        lung/liver to brain  . Cancer Paternal Uncle        cancer of unknown type  . Heart disease Maternal Grandfather   . Hypertension Sister   . Cancer Sister        eyelid  . Glaucoma Sister   . Heart disease Maternal Aunt        x 2 aunts  Physical Exam: Telemedicine visit - not performed    Assessment and Recommendations:  1.  New iron deficiency anemia, Hemoccult positive stool, worsening epigastric pain, decreased appetite.  Rule out ulcer, upper GI tract neoplasm.  Colonoscopy about 1.5 years ago was unremarkable. Schedule EGD for further evaluation. This procedure is urgent with a reasonable chance of ulcer or malignancy I recommend to schedule during COVID-19 restrictions and the patient agrees.  Given the severity of her heart failure her EGD will be  scheduled at the hospital.  Consider abdominal/pelvic CT, colonoscopy and VCE for further evaluation if EGD is not diagnostic. The risks (including bleeding, perforation, infection, missed lesions, medication reactions and possible hospitalization or surgery if complications occur), benefits, and alternatives to endoscopy with possible biopsy and possible dilation were discussed with the patient and they consent to proceed.   2.  Status post resection of a large pelvic mucinous cystadenoma, bilateral salpingo-oophorectomy, adhesion lysis, sigmoid colon resection and omentectomy in 2016 with post op CT evidence of a small incisional hernia.  3. CAD status post CABG status post DES.  Status post right carotid artery stenting.  Maintained on Plavix and aspirin. Hold Plavix 5 days before procedure - will instruct when and how to resume after procedure. Low but real risk of cardiovascular event such as heart attack, stroke, embolism, thrombosis or ischemia/infarct of other organs off Plavix explained and need to seek urgent help if this occurs. The patient consents to proceed. Will communicate by phone or EMR with patient's prescribing provider to confirm that holding Plavix is reasonable in this case.  She is advised to remain on aspirin daily throughout the time Plavix is held.  4.  GERD.  Continue Dexilant 60 mg daily and standard follow antireflux measures.    5.  Constipation. Continue Miralax daily.  6.  Combined systolic and diastolic heart failure with LV dysfunction. LV EF=35% per 2016 echo.  7.  Pulmonary hypertension, moderate.     These services were provided via telemedicine, audio only per patient request.  The patient was at home and the provider was in the office, alone.  We discussed the limitations of evaluation and management by telemedicine and the availability of in person appointments.  Patient consented for this telemedicine visit and is aware of possible charges for this service.   The other person participating in the telemedicine service was Marlon Pel, CMA who reviewed medications, allergies, past history and completed AVS.  Time spent on call: 16 minutes

## 2019-01-29 NOTE — Progress Notes (Signed)
History of Present Illness: This is a 63 year old female with Hemoccult positive stool and a new iron deficiency anemia.  Hemoglobin 9.9, MCV 69.  She relates worsening epigastric pain and a fair appetite over the past few months.  She states her energy level has been low with fatigue and generalized weakness for months.  She takes MiraLAX on a daily basis for management of constipation which works well.  Colonoscopy performed about a year and a half ago as below.  No other gastrointestinal complaints.  Her reflux symptoms are well controlled on Dexilant.  Cardiology office visit with Dr. Martinique on April 2 reviewed.  Denies weight loss, diarrhea, change in stool caliber, melena, hematochezia, nausea, vomiting, dysphagia, chest pain.  Colonoscopy 08/2017 adequate prep, complete exam - Patent end-to-side colo-colonic anastomosis, characterized by healthy appearing mucosa. - Internal hemorrhoids. - The examination was otherwise normal on direct and retroflexion views. - No specimens collected.   Allergies  Allergen Reactions  . Bee Venom Itching and Swelling  . Entresto [Sacubitril-Valsartan] Cough  . Tetracycline     Unknown   . Ace Inhibitors Cough   Outpatient Medications Prior to Visit  Medication Sig Dispense Refill  . acetaminophen (TYLENOL) 500 MG tablet Take 1,000 mg by mouth every 6 (six) hours as needed for moderate pain or headache.    . albuterol (VENTOLIN HFA) 108 (90 Base) MCG/ACT inhaler INHALER 2 PUFFS EVERY 6 HOURS AS NEEDED FOR WHEEZING/SHORTNESS OF BREATH 1 Inhaler 6  . aspirin EC 81 MG tablet Take 81 mg by mouth every evening.  30 tablet 6  . carvedilol (COREG) 12.5 MG tablet Take 1 tablet (12.5 mg total) by mouth daily. 180 tablet 3  . cetirizine (ZYRTEC) 10 MG tablet Take 10 mg by mouth daily as needed for allergies.    [name] clopidogrel (PLAVIX) 75 MG tablet TAKE 1 TABLET BY MOUTH EVERY DAY 90 tablet 2  . dexlansoprazole (DEXILANT) 60 MG capsule TAKE 1 CAPSULE (60 MG  TOTAL) BY MOUTH DAILY. 30 capsule 11  . diphenhydrAMINE (BENADRYL) 25 mg capsule Take 2 capsules in AM and 1 capsule at night (Patient taking differently: Take 50 mg by mouth 2 (two) times daily as needed for itching or allergies. ) 270 capsule 3  . fluticasone (FLONASE) 50 MCG/ACT nasal spray Place 2 sprays into both nostrils daily. 16 g 11  . furosemide (LASIX) 40 MG tablet TAKE 1 TABLET BY MOUTH EVERY DAY 90 tablet 3  . gabapentin (NEURONTIN) 300 MG capsule TAKE 2 CAPSULES BY MOUTH EVERY DAY AT BEDTIME 180 capsule 1  . glimepiride (AMARYL) 2 MG tablet Take 2 mg by mouth 2 (two) times daily.  6  . guaiFENesin (MUCINEX) 600 MG 12 hr tablet Take 2 tablets (1,200 mg total) by mouth 2 (two) times daily. 360 tablet 3  . hydrochlorothiazide (HYDRODIURIL) 50 MG tablet TAKE 1 TABLET BY MOUTH EVERY DAY 90 tablet 3  . iron polysaccharides (NU-IRON) 150 MG capsule Take 1 capsule (150 mg total) by mouth 2 (two) times daily. 60 capsule 3  . isosorbide mononitrate (IMDUR) 30 MG 24 hr tablet TAKE 1 TABLET BY MOUTH EVERY DAY 90 tablet 2  . KLOR-CON M10 10 MEQ tablet TAKE 1 TABLET BY MOUTH TWICE A DAY 180 tablet 3  . levETIRAcetam (KEPPRA) 1000 MG tablet TAKE 1 TABLET BY MOUTH TWICE A DAY 180 tablet 1  . losartan (COZAAR) 50 MG tablet Take 1 tablet (50 mg total) by mouth daily. 90 tablet 3  . metFORMIN (  GLUCOPHAGE-XR) 500 MG 24 hr tablet Take 500 mg by mouth 2 (two) times daily.  6  . nicotine polacrilex (NICORETTE) 4 MG gum Take 1 each (4 mg total) by mouth as needed for smoking cessation. 90 tablet 2  . nitroGLYCERIN (NITROLINGUAL) 0.4 MG/SPRAY spray PLACE 1 SPRAY UNDER THE TONGUE EVERY 5 (FIVE) MINUTES X 3 DOSES AS NEEDED FOR CHEST PAIN. 12 g 3  . ONE TOUCH ULTRA TEST test strip USE AS DIRECTED FOR TESTING BLOOD GLUCOSE 3 TIMES DAILY  1  . polyethylene glycol powder (GLYCOLAX/MIRALAX) powder TAKE 1 DOSE 17 GRAMS IN 8 OZ OF WATER DAILY AS NEEDED FOR CONSTIPATION. 527 g 3  . ranolazine (RANEXA) 1000 MG SR tablet  TAKE 1 TABLET BY MOUTH TWICE A DAY 180 tablet 1  . rosuvastatin (CRESTOR) 10 MG tablet TAKE 1 TABLET BY MOUTH EVERY DAY 90 tablet 2  . sodium chloride (OCEAN) 0.65 % SOLN nasal spray Place 1 spray into both nostrils as needed for congestion.     No facility-administered medications prior to visit.    Past Medical History:  Diagnosis Date  . Anemia   . Carotid stenosis    a. s/p right carotid stent 10/2017.  [name] Chronic systolic CHF (congestive heart failure) ([place])   . Colon polyps   . COPD (chronic obstructive pulmonary disease) ([place])   . Coronary artery disease    post CABG in 3/07 , coronary stents   . Diabetes mellitus    type 2  . Dyslipidemia   . Fibromyalgia   . GERD (gastroesophageal reflux disease)   . Headache    hx of   . Hyperlipidemia   . Hypertension   . Myocardial infarction ([place])   . Pneumonia    hx of   . Seizures ([place])    last seizure- 03/2013   . Shortness of breath dyspnea    with exertion or when fluid builds up   . Sleep apnea    used to wear a cpap- not used in 3 years   . Status post dilation of esophageal narrowing   . Stroke [facility]) 1993   problems with balance   . Systolic murmur    known mild AS and MR  . Thyroid goiter   . Tobacco abuse    Past Surgical History:  Procedure Laterality Date  . APPENDECTOMY    . BREAST BIOPSY Left 2016  . CARDIAC CATHETERIZATION  11/29/05   EF of 55%  . CARDIAC CATHETERIZATION  08/06/06   EF of 45-50%  . CARDIAC CATHETERIZATION N/A 05/06/2015   Procedure: Right/Left Heart Cath and Coronary/Graft Angiography;  Surgeon: Peter M Martinique, MD;  Location: [facility];  Service: Cardiovascular;  Laterality: N/A;  . CAROTID PTA/STENT INTERVENTION N/A 10/10/2017   Procedure: CAROTID PTA/STENT INTERVENTION - Right;  Surgeon: Serafina Mitchell, MD;  Location: [place] [facility];  Service: Cardiovascular;  Laterality: N/A;  . CERVICAL FUSION  1990  . CHOLECYSTECTOMY    . COLON RESECTION     mass removed and 4 in of colon   . COLONOSCOPY WITH PROPOFOL N/A 07/16/2017   Procedure: COLONOSCOPY WITH PROPOFOL;  Surgeon: Ladene Artist, MD;  Location: [facility];  Service: Endoscopy;  Laterality: N/A;  . CORONARY ARTERY BYPASS GRAFT  12/04/2005   x5 -- left internal mammary artery to the LAD, left radial artery to the ramus intermedius, saphenous vein graft to the obtuse marginal 1, sequential saphenous vein grat to the acute marginal and posterior descending,  endoscopic vein harvesting from the left thigh with open vein harvest from right leg  . CORONARY STENT PLACEMENT  08/11/06   PCI of her ciurcumflex/OM vessel  . LAPAROTOMY Bilateral 05/19/2015   Procedure: EXPLORATORY LAPAROTOMY WITH BILATERAL SALPINGO OOPHORECTOMY /OMENTECTOMY/SEGMENTAL SIGMOID COLECTOMY ;  Surgeon: Everitt Amber, MD;  Location: [facility];  Service: Gynecology;  Laterality: Bilateral;  . PERIPHERAL VASCULAR CATHETERIZATION N/A 11/15/2015   Procedure: Carotid PTA/Stent Intervention;  Surgeon: Lorretta Harp, MD;  Location: [facility];  Service: Cardiovascular;  Laterality: N/A;  . PERIPHERAL VASCULAR CATHETERIZATION  11/15/2015   Procedure: Carotid Angiography;  Surgeon: Lorretta Harp, MD;  Location: [facility];  Service: Cardiovascular;;   Social History   Socioeconomic History  . Marital status: Divorced    Spouse name: Not on file  . Number of children: 0  . Years of education: Not on file  . Highest education level: Not on file  Occupational History  . Occupation: disabled  Social Needs  . Financial resource strain: Not on file  . Food insecurity:    Worry: Not on file    Inability: Not on file  . Transportation needs:    Medical: Not on file    Non-medical: Not on file  Tobacco Use  . Smoking status: Current Every Day Smoker    Packs/day: 1.00    Years: 51.00    Pack years: 51.00    Types: Cigarettes  . Smokeless tobacco: Never Used  . Tobacco comment: 1.5 pack to 2 packs per day. Has been working on reducing  the # of cigarettes she smokes, down to 1/2 PPD  Substance and Sexual Activity  . Alcohol use: No  . Drug use: No  . Sexual activity: Not Currently    Partners: Male    Birth control/protection: None  Lifestyle  . Physical activity:    Days per week: Not on file    Minutes per session: Not on file  . Stress: Not on file  Relationships  . Social connections:    Talks on phone: Not on file    Gets together: Not on file    Attends religious service: Not on file    Active member of club or organization: Not on file    Attends meetings of clubs or organizations: Not on file    Relationship status: Not on file  Other Topics Concern  . Not on file  Social History Narrative  . Not on file   Family History  Problem Relation Age of Onset  . Heart disease Mother   . Diabetes Mother   . COPD Mother   . Hyperlipidemia Mother   . Hypertension Mother   . Cancer Father        met, origin unknown  . Heart attack Father   . Drug abuse Paternal Grandmother   . Stroke Paternal Grandfather   . Lung cancer Paternal Aunt        lung with mets to brain  . Melanoma Paternal Uncle   . Lung cancer Paternal Uncle        lung/liver to brain  . Cancer Paternal Uncle        cancer of unknown type  . Heart disease Maternal Grandfather   . Hypertension Sister   . Cancer Sister        eyelid  . Glaucoma Sister   . Heart disease Maternal Aunt        x 2 aunts  Physical Exam: Telemedicine visit - not performed    Assessment and Recommendations:  1.  New iron deficiency anemia, Hemoccult positive stool, worsening epigastric pain, decreased appetite.  Rule out ulcer, upper GI tract neoplasm.  Colonoscopy about 1.5 years ago was unremarkable. Schedule EGD for further evaluation. This procedure is urgent with a reasonable chance of ulcer or malignancy I recommend to schedule during COVID-19 restrictions and the patient agrees.  Given the severity of her heart failure her EGD will be  scheduled at the hospital.  Consider abdominal/pelvic CT, colonoscopy and VCE for further evaluation if EGD is not diagnostic. The risks (including bleeding, perforation, infection, missed lesions, medication reactions and possible hospitalization or surgery if complications occur), benefits, and alternatives to endoscopy with possible biopsy and possible dilation were discussed with the patient and they consent to proceed.   2.  Status post resection of a large pelvic mucinous cystadenoma, bilateral salpingo-oophorectomy, adhesion lysis, sigmoid colon resection and omentectomy in 2016 with post op CT evidence of a small incisional hernia.  3. CAD status post CABG status post DES.  Status post right carotid artery stenting.  Maintained on Plavix and aspirin. Hold Plavix 5 days before procedure - will instruct when and how to resume after procedure. Low but real risk of cardiovascular event such as heart attack, stroke, embolism, thrombosis or ischemia/infarct of other organs off Plavix explained and need to seek urgent help if this occurs. The patient consents to proceed. Will communicate by phone or EMR with patient's prescribing provider to confirm that holding Plavix is reasonable in this case.  She is advised to remain on aspirin daily throughout the time Plavix is held.  4.  GERD.  Continue Dexilant 60 mg daily and standard follow antireflux measures.    5.  Constipation. Continue Miralax daily.  6.  Combined systolic and diastolic heart failure with LV dysfunction. LV EF=35% per 2016 echo.  7.  Pulmonary hypertension, moderate.     These services were provided via telemedicine, audio only per patient request.  The patient was at home and the provider was in the office, alone.  We discussed the limitations of evaluation and management by telemedicine and the availability of in person appointments.  Patient consented for this telemedicine visit and is aware of possible charges for this service.   The other person participating in the telemedicine service was Marlon Pel, CMA who reviewed medications, allergies, past history and completed AVS.  Time spent on call: 16 minutes

## 2019-01-29 NOTE — Telephone Encounter (Signed)
[facility] Pre-operative Risk Assessment     Request for surgical clearance:     Endoscopy Procedure  What type of surgery is being performed?     EGD   When is this surgery scheduled?     02/03/19  What type of clearance is required ?   Pharmacy  Are there any medications that need to be held prior to surgery and how long? Plavix starting now x 5 days  Practice name and name of physician performing surgery?      [facility]  What is your office phone and fax number?      Phone- [phone]  [phone]  Anesthesia type (None, local, MAC, general) ?       MAC

## 2019-01-29 NOTE — Addendum Note (Signed)
Addended by: Marzella Schlein on: 01/29/2019 04:17 PM   Modules accepted: Orders, SmartSet

## 2019-01-29 NOTE — Telephone Encounter (Signed)
   Primary Cardiologist: Peter Martinique, MD  Chart reviewed as part of pre-operative protocol coverage.  Catheline Hixon Macadam was last seen on 01/01/19 by Dr. Martinique.    Per Dr. Doug Sou clinic note, she may hold plavix for up to 7 days prior to planned procedure. Resume when safe to do so per surgeon. He also noted that she is not an ideal candidate for general anesthesia.   I will route this recommendation to the requesting party via Epic fax function and remove from pre-op pool.  Please call with questions.  [place], PA 01/29/2019, 1:06 PM

## 2019-01-30 ENCOUNTER — Ambulatory Visit: Payer: Medicare Other | Admitting: Gastroenterology

## 2019-02-02 ENCOUNTER — Encounter (HOSPITAL_COMMUNITY): Payer: Self-pay | Admitting: *Deleted

## 2019-02-02 ENCOUNTER — Other Ambulatory Visit: Payer: Self-pay

## 2019-02-02 NOTE — Progress Notes (Signed)
SPOKE W/  _patient via phone     SCREENING SYMPTOMS OF COVID 19:   COUGH-- patient states "has Smoker's cough"  RUNNY NOSE---No   SORE THROAT---No  NASAL CONGESTION----No  SNEEZING----No  SHORTNESS OF BREATH---patient states "has shortness of breath all the time"  DIFFICULTY BREATHING---No  TEMP >100.0 -----No  UNEXPLAINED BODY ACHES------No  CHILLS -------- No  HEADACHES ---------No  LOSS OF SMELL/ TASTE --------No    HAVE YOU OR ANY FAMILY MEMBER TRAVELLED PAST 14 DAYS OUT OF THE   COUNTY---No STATE----No COUNTRY----No  HAVE YOU OR ANY FAMILY MEMBER BEEN EXPOSED TO ANYONE WITH COVID 19? No   Reviewed with patient Visitor Restrictions at this time due to Covid 19 with patient verbalizing understanding.

## 2019-02-03 ENCOUNTER — Ambulatory Visit (HOSPITAL_COMMUNITY): Payer: Medicare Other | Admitting: Anesthesiology

## 2019-02-03 ENCOUNTER — Ambulatory Visit (HOSPITAL_COMMUNITY)
Admission: RE | Admit: 2019-02-03 | Discharge: 2019-02-03 | Disposition: A | Discharge status: Home / Self Care | Payer: Medicare Other | Attending: Gastroenterology | Admitting: Gastroenterology

## 2019-02-03 ENCOUNTER — Encounter (HOSPITAL_COMMUNITY): Payer: Self-pay

## 2019-02-03 ENCOUNTER — Encounter (HOSPITAL_COMMUNITY)
Admission: RE | Disposition: A | Discharge status: Home / Self Care | Payer: Self-pay | Source: Home / Self Care | Attending: Gastroenterology

## 2019-02-03 DIAGNOSIS — K449 Diaphragmatic hernia without obstruction or gangrene: Secondary | ICD-10-CM | POA: Insufficient documentation

## 2019-02-03 DIAGNOSIS — E119 Type 2 diabetes mellitus without complications: Secondary | ICD-10-CM | POA: Diagnosis not present

## 2019-02-03 DIAGNOSIS — Z7984 Long term (current) use of oral hypoglycemic drugs: Secondary | ICD-10-CM | POA: Diagnosis not present

## 2019-02-03 DIAGNOSIS — I2510 Atherosclerotic heart disease of native coronary artery without angina pectoris: Secondary | ICD-10-CM | POA: Diagnosis not present

## 2019-02-03 DIAGNOSIS — Z8673 Personal history of transient ischemic attack (TIA), and cerebral infarction without residual deficits: Secondary | ICD-10-CM | POA: Insufficient documentation

## 2019-02-03 DIAGNOSIS — M797 Fibromyalgia: Secondary | ICD-10-CM | POA: Insufficient documentation

## 2019-02-03 DIAGNOSIS — Z888 Allergy status to other drugs, medicaments and biological substances status: Secondary | ICD-10-CM | POA: Insufficient documentation

## 2019-02-03 DIAGNOSIS — D509 Iron deficiency anemia, unspecified: Secondary | ICD-10-CM | POA: Diagnosis not present

## 2019-02-03 DIAGNOSIS — I739 Peripheral vascular disease, unspecified: Secondary | ICD-10-CM | POA: Insufficient documentation

## 2019-02-03 DIAGNOSIS — I252 Old myocardial infarction: Secondary | ICD-10-CM | POA: Diagnosis not present

## 2019-02-03 DIAGNOSIS — F329 Major depressive disorder, single episode, unspecified: Secondary | ICD-10-CM | POA: Insufficient documentation

## 2019-02-03 DIAGNOSIS — J449 Chronic obstructive pulmonary disease, unspecified: Secondary | ICD-10-CM | POA: Insufficient documentation

## 2019-02-03 DIAGNOSIS — I10 Essential (primary) hypertension: Secondary | ICD-10-CM | POA: Diagnosis not present

## 2019-02-03 DIAGNOSIS — G4730 Sleep apnea, unspecified: Secondary | ICD-10-CM | POA: Diagnosis not present

## 2019-02-03 DIAGNOSIS — Z825 Family history of asthma and other chronic lower respiratory diseases: Secondary | ICD-10-CM | POA: Insufficient documentation

## 2019-02-03 DIAGNOSIS — R569 Unspecified convulsions: Secondary | ICD-10-CM | POA: Insufficient documentation

## 2019-02-03 DIAGNOSIS — Z833 Family history of diabetes mellitus: Secondary | ICD-10-CM | POA: Insufficient documentation

## 2019-02-03 DIAGNOSIS — I350 Nonrheumatic aortic (valve) stenosis: Secondary | ICD-10-CM | POA: Diagnosis not present

## 2019-02-03 DIAGNOSIS — Z801 Family history of malignant neoplasm of trachea, bronchus and lung: Secondary | ICD-10-CM | POA: Insufficient documentation

## 2019-02-03 DIAGNOSIS — Z808 Family history of malignant neoplasm of other organs or systems: Secondary | ICD-10-CM | POA: Insufficient documentation

## 2019-02-03 DIAGNOSIS — K3189 Other diseases of stomach and duodenum: Secondary | ICD-10-CM | POA: Diagnosis not present

## 2019-02-03 DIAGNOSIS — Z820 Family history of epilepsy and other diseases of the nervous system: Secondary | ICD-10-CM | POA: Insufficient documentation

## 2019-02-03 DIAGNOSIS — I110 Hypertensive heart disease with heart failure: Secondary | ICD-10-CM | POA: Diagnosis not present

## 2019-02-03 DIAGNOSIS — Z9049 Acquired absence of other specified parts of digestive tract: Secondary | ICD-10-CM | POA: Insufficient documentation

## 2019-02-03 DIAGNOSIS — Z91030 Bee allergy status: Secondary | ICD-10-CM | POA: Diagnosis not present

## 2019-02-03 DIAGNOSIS — I340 Nonrheumatic mitral (valve) insufficiency: Secondary | ICD-10-CM | POA: Insufficient documentation

## 2019-02-03 DIAGNOSIS — R195 Other fecal abnormalities: Secondary | ICD-10-CM | POA: Diagnosis not present

## 2019-02-03 DIAGNOSIS — Z79899 Other long term (current) drug therapy: Secondary | ICD-10-CM | POA: Insufficient documentation

## 2019-02-03 DIAGNOSIS — Z981 Arthrodesis status: Secondary | ICD-10-CM | POA: Insufficient documentation

## 2019-02-03 DIAGNOSIS — F419 Anxiety disorder, unspecified: Secondary | ICD-10-CM | POA: Insufficient documentation

## 2019-02-03 DIAGNOSIS — K219 Gastro-esophageal reflux disease without esophagitis: Secondary | ICD-10-CM | POA: Insufficient documentation

## 2019-02-03 DIAGNOSIS — Z881 Allergy status to other antibiotic agents status: Secondary | ICD-10-CM | POA: Diagnosis not present

## 2019-02-03 DIAGNOSIS — Z7982 Long term (current) use of aspirin: Secondary | ICD-10-CM | POA: Diagnosis not present

## 2019-02-03 DIAGNOSIS — Z7951 Long term (current) use of inhaled steroids: Secondary | ICD-10-CM | POA: Diagnosis not present

## 2019-02-03 DIAGNOSIS — Z823 Family history of stroke: Secondary | ICD-10-CM | POA: Insufficient documentation

## 2019-02-03 DIAGNOSIS — K5900 Constipation, unspecified: Secondary | ICD-10-CM | POA: Insufficient documentation

## 2019-02-03 DIAGNOSIS — Z955 Presence of coronary angioplasty implant and graft: Secondary | ICD-10-CM | POA: Insufficient documentation

## 2019-02-03 DIAGNOSIS — Z9889 Other specified postprocedural states: Secondary | ICD-10-CM | POA: Insufficient documentation

## 2019-02-03 DIAGNOSIS — I5022 Chronic systolic (congestive) heart failure: Secondary | ICD-10-CM | POA: Insufficient documentation

## 2019-02-03 DIAGNOSIS — E785 Hyperlipidemia, unspecified: Secondary | ICD-10-CM | POA: Insufficient documentation

## 2019-02-03 DIAGNOSIS — F17210 Nicotine dependence, cigarettes, uncomplicated: Secondary | ICD-10-CM | POA: Insufficient documentation

## 2019-02-03 DIAGNOSIS — I272 Pulmonary hypertension, unspecified: Secondary | ICD-10-CM | POA: Diagnosis not present

## 2019-02-03 DIAGNOSIS — K648 Other hemorrhoids: Secondary | ICD-10-CM | POA: Insufficient documentation

## 2019-02-03 DIAGNOSIS — R1013 Epigastric pain: Secondary | ICD-10-CM | POA: Diagnosis not present

## 2019-02-03 DIAGNOSIS — Z951 Presence of aortocoronary bypass graft: Secondary | ICD-10-CM | POA: Insufficient documentation

## 2019-02-03 DIAGNOSIS — Z8249 Family history of ischemic heart disease and other diseases of the circulatory system: Secondary | ICD-10-CM | POA: Insufficient documentation

## 2019-02-03 DIAGNOSIS — Z7902 Long term (current) use of antithrombotics/antiplatelets: Secondary | ICD-10-CM | POA: Insufficient documentation

## 2019-02-03 HISTORY — PX: ESOPHAGOGASTRODUODENOSCOPY (EGD) WITH PROPOFOL: SHX5813

## 2019-02-03 HISTORY — PX: BIOPSY: SHX5522

## 2019-02-03 LAB — GLUCOSE, CAPILLARY: Glucose-Capillary: 195 mg/dL — ABNORMAL HIGH (ref 70–99)

## 2019-02-03 SURGERY — ESOPHAGOGASTRODUODENOSCOPY (EGD) WITH PROPOFOL
Anesthesia: General

## 2019-02-03 MED ORDER — ALBUTEROL SULFATE HFA 108 (90 BASE) MCG/ACT IN AERS
INHALATION_SPRAY | RESPIRATORY_TRACT | Status: DC | PRN
  Administered 2019-02-03: 3 via RESPIRATORY_TRACT

## 2019-02-03 MED ORDER — ETOMIDATE 2 MG/ML IV SOLN
INTRAVENOUS | Status: DC | PRN
  Administered 2019-02-03: 14 mg via INTRAVENOUS

## 2019-02-03 MED ORDER — LIDOCAINE 2% (20 MG/ML) 5 ML SYRINGE
INTRAMUSCULAR | Status: DC | PRN
  Administered 2019-02-03: 40 mg via INTRAVENOUS

## 2019-02-03 MED ORDER — SUCCINYLCHOLINE CHLORIDE 200 MG/10ML IV SOSY
PREFILLED_SYRINGE | INTRAVENOUS | Status: DC | PRN
  Administered 2019-02-03: 120 mg via INTRAVENOUS

## 2019-02-03 MED ORDER — SODIUM CHLORIDE 0.9 % IV SOLN: INTRAVENOUS | Status: DC

## 2019-02-03 MED ORDER — ONDANSETRON HCL 4 MG/2ML IJ SOLN
INTRAMUSCULAR | Status: DC | PRN
  Administered 2019-02-03: 4 mg via INTRAVENOUS

## 2019-02-03 MED ORDER — PROPOFOL 10 MG/ML IV BOLUS
INTRAVENOUS | Status: AC
  Filled 2019-02-03: qty 20

## 2019-02-03 MED ORDER — ALBUTEROL SULFATE HFA 108 (90 BASE) MCG/ACT IN AERS
INHALATION_SPRAY | RESPIRATORY_TRACT | Status: AC
  Filled 2019-02-03: qty 6.7

## 2019-02-03 MED ORDER — FENTANYL CITRATE (PF) 100 MCG/2ML IJ SOLN
INTRAMUSCULAR | Status: AC
  Filled 2019-02-03: qty 2

## 2019-02-03 MED ORDER — LACTATED RINGERS IV SOLN: INTRAVENOUS | Status: DC

## 2019-02-03 SURGICAL SUPPLY — 15 items

## 2019-02-03 NOTE — Interval H&P Note (Signed)
History and Physical Interval Note:  02/03/2019 11:29 AM  Diane [name]  has presented today for surgery, with the diagnosis of IDA, low EF.  The various methods of treatment have been discussed with the patient and family. After consideration of risks, benefits and other options for treatment, the patient has consented to  Procedure(s): ESOPHAGOGASTRODUODENOSCOPY (EGD) WITH PROPOFOL (N/A) as a surgical intervention.  The patient's history has been reviewed, patient examined, no change in status, stable for surgery.  I have reviewed the patient's chart and labs.  Questions were answered to the patient's satisfaction.     Pricilla Riffle. Fuller Plan

## 2019-02-03 NOTE — Anesthesia Postprocedure Evaluation (Signed)
Anesthesia Post Note  Patient: Diane [name]  Procedure(s) Performed: ESOPHAGOGASTRODUODENOSCOPY (EGD) WITH PROPOFOL (N/A ) BIOPSY     Patient location during evaluation: Endoscopy Anesthesia Type: General Level of consciousness: awake and alert, patient cooperative and oriented Pain management: pain level controlled Vital Signs Assessment: post-procedure vital signs reviewed and stable Respiratory status: spontaneous breathing, nonlabored ventilation and respiratory function stable Cardiovascular status: blood pressure returned to baseline and stable Postop Assessment: no apparent nausea or vomiting Anesthetic complications: no    Last Vitals:  Vitals:   02/03/19 1240 02/03/19 1250  BP: (!) 150/61 126/63  Pulse: 98 99  Resp: 14 17  Temp:    SpO2: 94% 95%    Last Pain:  Vitals:   02/03/19 1250  TempSrc:   PainSc: 0-No pain                 Elvin Mccartin,E. Kennadie Brenner

## 2019-02-03 NOTE — Anesthesia Preprocedure Evaluation (Addendum)
Anesthesia Evaluation  Patient identified by MRN, date of birth, ID band Patient awake    Reviewed: Allergy & Precautions, NPO status , Patient's Chart, lab work & pertinent test results, reviewed documented beta blocker date and time   History of Anesthesia Complications Negative for: history of anesthetic complications  Airway Mallampati: II  TM Distance: >3 FB Neck ROM: Full    Dental  (+) Poor Dentition, Loose, Chipped, Dental Advisory Given   Pulmonary shortness of breath, sleep apnea (does not use her O2) and Oxygen sleep apnea , COPD (does not use her O2 (smokes)),  COPD inhaler, Current Smoker,    breath sounds clear to auscultation + decreased breath sounds      Cardiovascular hypertension, Pt. on medications and Pt. on home beta blockers + angina (daily) + CAD (LIMA to LAD patent, other grafts occluded), + Past MI, + Cardiac Stents, + CABG and + Peripheral Vascular Disease   Rhythm:Regular Rate:Normal + Systolic murmurs '16 ECHO: EF is low 35%. Diastolic dysfunction, mod MR   Neuro/Psych Seizures -, Well Controlled,  Anxiety Depression CVA    GI/Hepatic Neg liver ROS, GERD  Controlled and Medicated,  Endo/Other  diabetes (glu 195), Oral Hypoglycemic Agents  Renal/GU negative Renal ROS     Musculoskeletal  (+) Fibromyalgia -  Abdominal   Peds  Hematology  (+) anemia ,   Anesthesia Other Findings   Reproductive/Obstetrics                           Anesthesia Physical Anesthesia Plan  ASA: III  Anesthesia Plan: General   Post-op Pain Management:    Induction: Intravenous  PONV Risk Score and Plan: 2 and Ondansetron, Dexamethasone and Treatment may vary due to age or medical condition  Airway Management Planned: Oral ETT  Additional Equipment:   Intra-op Plan:   Post-operative Plan: Extubation in OR  Informed Consent: I have reviewed the patients History and Physical,  chart, labs and discussed the procedure including the risks, benefits and alternatives for the proposed anesthesia with the patient or authorized representative who has indicated his/her understanding and acceptance.     Dental advisory given  Plan Discussed with: CRNA and Surgeon  Anesthesia Plan Comments: (Plan routine monitors, GETA (EGD during COVID-19 pandemic, so intubated))       Anesthesia Quick Evaluation

## 2019-02-03 NOTE — Op Note (Signed)
[facility] Patient Name: Diane [name] Procedure Date: 02/03/2019 MRN: [id number] Attending MD: Ladene Artist , MD Date of Birth: 11-12-1955 CSN: [id number] Age: 63 Admit Type: Inpatient Procedure:                Upper GI endoscopy Indications:              Epigastric abdominal pain, Iron deficiency anemia,                            Heme positive stool Providers:                Pricilla Riffle. Fuller Plan, MD, Cleda Daub, RN, William Dalton, Technician Referring MD:             [name]. Tower, MD Medicines:                General Anesthesia Complications:            No immediate complications. Estimated Blood Loss:     Estimated blood loss was minimal. Procedure:                Pre-Anesthesia Assessment:                           - Prior to the procedure, a History and Physical                            was performed, and patient medications and                            allergies were reviewed. The patient's tolerance of                            previous anesthesia was also reviewed. The risks                            and benefits of the procedure and the sedation                            options and risks were discussed with the patient.                            All questions were answered, and informed consent                            was obtained. Prior Anticoagulants: The patient has                            taken Plavix (clopidogrel), last dose was 5 days                            prior to procedure. ASA Grade Assessment: III - A  patient with severe systemic disease. After                            reviewing the risks and benefits, the patient was                            deemed in satisfactory condition to undergo the                            procedure.                           After obtaining informed consent, the endoscope was                            passed under direct vision.  Throughout the                            procedure, the patient's blood pressure, pulse, and                            oxygen saturations were monitored continuously. The                            GIF-H190 ([id number]) Olympus gastroscope was                            introduced through the mouth, and advanced to the                            second part of duodenum. The upper GI endoscopy was                            accomplished without difficulty. The patient                            tolerated the procedure well. Scope In: Scope Out: Findings:      The examined esophagus was normal.      A small hiatal hernia was present.      Striped moderately erythematous mucosa without bleeding was found in the       gastric antrum. Biopsies were taken with a cold forceps for histology.      The exam of the stomach was otherwise normal.      The duodenal bulb and second portion of the duodenum were normal.       Biopsies for histology were taken with a cold forceps for evaluation of       celiac disease. Impression:               - Normal esophagus.                           - Small hiatal hernia.                           - Erythematous mucosa in the antrum. Biopsied.                           -  Normal duodenal bulb and second portion of the                            duodenum. Biopsied. Moderate Sedation:      Not Applicable - Patient had care per Anesthesia. Recommendation:           - Patient has a contact number available for                            emergencies. The signs and symptoms of potential                            delayed complications were discussed with the                            patient. Return to normal activities tomorrow.                            Written discharge instructions were provided to the                            patient.                           - Resume previous diet.                           - Follow antireflux measures.                            - Continue present medications.                           - Resume Plavix (clopidogrel) at prior dose                            tomorrow. Refer to managing physician for further                            adjustment of therapy.                           - Await pathology results.                           - Perform a CT scan (computed tomography) of                            abdomen with contrast and pelvis with contrast at                            appointment to be scheduled.                           - Check CMP at appointment to be scheduled. Procedure Code(s):        --- Professional ---  32202, Esophagogastroduodenoscopy, flexible,                            transoral; with biopsy, single or multiple Diagnosis Code(s):        --- Professional ---                           K44.9, Diaphragmatic hernia without obstruction or                            gangrene                           K31.89, Other diseases of stomach and duodenum                           R10.13, Epigastric pain                           D50.9, Iron deficiency anemia, unspecified                           R19.5, Other fecal abnormalities CPT copyright 2019 American Medical Association. All rights reserved. The codes documented in this report are preliminary and upon coder review may  be revised to meet current compliance requirements. Ladene Artist, MD 02/03/2019 12:26:00 PM This report has been signed electronically. Number of Addenda: 0

## 2019-02-03 NOTE — Transfer of Care (Signed)
Immediate Anesthesia Transfer of Care Note  Patient: Diane [name]  Procedure(s) Performed: ESOPHAGOGASTRODUODENOSCOPY (EGD) WITH PROPOFOL (N/A ) BIOPSY  Patient Location: Endoscopy Unit  Anesthesia Type:General  Level of Consciousness: drowsy  Airway & Oxygen Therapy: Patient Spontanous Breathing and Patient connected to face mask oxygen  Post-op Assessment: Report given to RN and Post -op Vital signs reviewed and stable  Post vital signs: Reviewed and stable  Last Vitals:  Vitals Value Taken Time  BP    Temp 36.9 C 02/03/2019 12:32 PM  Pulse 100 02/03/2019 12:32 PM  Resp 19 02/03/2019 12:32 PM  SpO2 100 % 02/03/2019 12:32 PM  Vitals shown include unvalidated device data.  Last Pain:  Vitals:   02/03/19 1232  TempSrc: Oral  PainSc:          Complications: No apparent anesthesia complications

## 2019-02-03 NOTE — Discharge Instructions (Signed)
YOU HAD AN ENDOSCOPIC PROCEDURE TODAY: Refer to the procedure report and other information in the discharge instructions given to you for any specific questions about what was found during the examination. If this information does not answer your questions, please call [place] office at [phone] to clarify.   YOU SHOULD EXPECT: Some feelings of bloating in the abdomen. Passage of more gas than usual. Walking can help get rid of the air that was put into your GI tract during the procedure and reduce the bloating. If you had a lower endoscopy (such as a colonoscopy or flexible sigmoidoscopy) you may notice spotting of blood in your stool or on the toilet paper. Some abdominal soreness may be present for a day or two, also.  DIET: Your first meal following the procedure should be a light meal and then it is ok to progress to your normal diet. A half-sandwich or bowl of soup is an example of a good first meal. Heavy or fried foods are harder to digest and may make you feel nauseous or bloated. Drink plenty of fluids but you should avoid alcoholic beverages for 24 hours. If you had a esophageal dilation, please see attached instructions for diet.    ACTIVITY: Your care partner should take you home directly after the procedure. You should plan to take it easy, moving slowly for the rest of the day. You can resume normal activity the day after the procedure however YOU SHOULD NOT DRIVE, use power tools, machinery or perform tasks that involve climbing or major physical exertion for 24 hours (because of the sedation medicines used during the test).   SYMPTOMS TO REPORT IMMEDIATELY: A gastroenterologist can be reached at any hour. Please call [phone]  for any of the following symptoms:  Following lower endoscopy (colonoscopy, flexible sigmoidoscopy) Excessive amounts of blood in the stool  Significant tenderness, worsening of abdominal pains  Swelling of the abdomen that is new, acute  Fever of 100 or  higher  Following upper endoscopy (EGD, EUS, ERCP, esophageal dilation) Vomiting of blood or coffee ground material  New, significant abdominal pain  New, significant chest pain or pain under the shoulder blades  Painful or persistently difficult swallowing  New shortness of breath  Black, tarry-looking or red, bloody stools  FOLLOW UP:  If any biopsies were taken you will be contacted by phone or by letter within the next 1-3 weeks. Call [phone]  if you have not heard about the biopsies in 3 weeks.  Please also call with any specific questions about appointments or follow up tests.  Coronavirus (COVID-19) Are you at risk?  Are you at risk for the Coronavirus (COVID-19)?  To be considered high risk for Coronavirus (COVID-19), you have to meet the following criteria:  Traveled to [place], [place], [place], [place] or [place]; or in the [place] to [place], [place], [place], or [place]; and have fever, cough, and shortness of breath within the last 2 weeks of travel or  Been in close contact with a person diagnosed with COVID-19 within the last 2 weeks and have fever, cough, and shortness of breath  If you do not meet these criteria, you are considered low risk for COVID-19.  What to do if you are high risk for COVID-19?   If you are having a medical emergency, call 911.  Seek medical care right away. Before you go to a doctors office, urgent care or emergency department, call ahead and tell them about your recent travel, contact  with someone diagnosed with COVID-19, and your symptoms. You should receive instructions from your physicians office regarding next steps of care.   When you arrive at healthcare provider, tell the healthcare staff immediately you have returned from visiting [place], [place], [place], [place] or [place]; or traveled in the [place] to [place], [place], [place], or [place]; in the last two weeks or you have been in close contact with  a person diagnosed with COVID-19 in the last 2 weeks.    Tell the health care staff about your symptoms: fever, cough and shortness of breath.  After you have been seen by a medical provider, you will be either: o Tested for (COVID-19) and discharged home on quarantine except to seek medical care if symptoms worsen, and asked to  - Stay home and avoid contact with others until you get your results (4-5 days)  - Avoid travel on public transportation if possible (such as bus, train, or airplane) or o Sent to the Emergency Department by EMS for evaluation, COVID-19 testing, and possible admission depending on your condition and test results.   What to do if you are LOW RISK for COVID-19?  Reduce your risk of any infection by using the same precautions used for avoiding the common cold or flu:   Wash your hands often with soap and warm water for at least 20 seconds.  If soap and water are not readily available, use an alcohol-based hand sanitizer with at least 60% alcohol.   If coughing or sneezing, cover your mouth and nose by coughing or sneezing into the elbow areas of your shirt or coat, into a tissue or into your sleeve (not your hands).  Avoid shaking hands with others and consider head nods or verbal greetings only.  Avoid touching your eyes, nose, or mouth with unwashed hands.   Avoid close contact with people who are sick.  Avoid places or events with large numbers of people in one location, like concerts or sporting events.  Carefully consider travel plans you have or are making.  If you are planning any travel outside or inside the [place], visit the [place] webpage for the latest health notices.  If you have some symptoms but not all symptoms, continue to monitor at home and seek medical attention if your symptoms worsen.  If you are having a medical emergency, call 911.   Additional healthcare options for patients  [place] / e-Visit:   [email or web address]           [facility]: [facility]: [facility]: [phone]

## 2019-02-03 NOTE — Anesthesia Procedure Notes (Signed)
Procedure Name: Intubation Date/Time: 02/03/2019 12:04 PM Performed by: Lind Covert, CRNA Pre-anesthesia Checklist: Patient identified, Emergency Drugs available, Suction available, Patient being monitored and Timeout performed Patient Re-evaluated:Patient Re-evaluated prior to induction Oxygen Delivery Method: Circle system utilized Preoxygenation: Pre-oxygenation with 100% oxygen Induction Type: IV induction Laryngoscope Size: Mac and 4 Grade View: Grade I Tube type: Oral Tube size: 7.0 mm Number of attempts: 1 Airway Equipment and Method: Stylet Placement Confirmation: ETT inserted through vocal cords under direct vision,  positive ETCO2 and breath sounds checked- equal and bilateral Secured at: 21 cm Tube secured with: Tape Dental Injury: Teeth and Oropharynx as per pre-operative assessment

## 2019-02-04 ENCOUNTER — Encounter: Payer: Self-pay | Admitting: Gastroenterology

## 2019-02-04 ENCOUNTER — Other Ambulatory Visit: Payer: Self-pay

## 2019-02-04 ENCOUNTER — Telehealth: Payer: Self-pay | Admitting: Gastroenterology

## 2019-02-04 ENCOUNTER — Telehealth: Payer: Self-pay

## 2019-02-04 DIAGNOSIS — D509 Iron deficiency anemia, unspecified: Secondary | ICD-10-CM

## 2019-02-04 DIAGNOSIS — R1013 Epigastric pain: Secondary | ICD-10-CM

## 2019-02-04 DIAGNOSIS — R195 Other fecal abnormalities: Secondary | ICD-10-CM

## 2019-02-04 DIAGNOSIS — R1030 Lower abdominal pain, unspecified: Secondary | ICD-10-CM

## 2019-02-04 NOTE — Telephone Encounter (Addendum)
Patient states she will come in for lab work on 02/06/19. CT scheduled for 02/11/19. Diane [name] went over instructions with patient of date and time. Patient will come by our office for lab work and to get CT contrast on 02/09/19.

## 2019-02-04 NOTE — Telephone Encounter (Signed)
See previous phone message from today.

## 2019-02-04 NOTE — Telephone Encounter (Signed)
-----   Message from Ladene Artist, MD sent at 02/03/2019 12:54 PM EDT ----- See EGD note from today. Please set up CMP and abd/pelvic CT for lower abdominal pain, epigastric pain, iron deficiency, occult blood in stool.

## 2019-02-04 NOTE — Telephone Encounter (Signed)
Patient is returning your call.  

## 2019-02-04 NOTE — Telephone Encounter (Signed)
Left a message for patient to return my call. 

## 2019-02-05 ENCOUNTER — Encounter (HOSPITAL_COMMUNITY): Payer: Self-pay | Admitting: Gastroenterology

## 2019-02-05 ENCOUNTER — Other Ambulatory Visit: Payer: Self-pay | Admitting: Family Medicine

## 2019-02-09 ENCOUNTER — Other Ambulatory Visit (INDEPENDENT_AMBULATORY_CARE_PROVIDER_SITE_OTHER): Payer: Medicare Other

## 2019-02-09 DIAGNOSIS — R1013 Epigastric pain: Secondary | ICD-10-CM | POA: Diagnosis not present

## 2019-02-09 DIAGNOSIS — D509 Iron deficiency anemia, unspecified: Secondary | ICD-10-CM | POA: Diagnosis not present

## 2019-02-09 DIAGNOSIS — R1030 Lower abdominal pain, unspecified: Secondary | ICD-10-CM

## 2019-02-09 DIAGNOSIS — R195 Other fecal abnormalities: Secondary | ICD-10-CM

## 2019-02-09 LAB — COMPREHENSIVE METABOLIC PANEL
ALT: 11 U/L (ref 0–35)
AST: 11 U/L (ref 0–37)
Albumin: 4 g/dL (ref 3.5–5.2)
Alkaline Phosphatase: 75 U/L (ref 39–117)
BUN: 11 mg/dL (ref 6–23)
CO2: 30 mEq/L (ref 19–32)
Calcium: 9.7 mg/dL (ref 8.4–10.5)
Chloride: 99 mEq/L (ref 96–112)
Creatinine, Ser: 0.71 mg/dL (ref 0.40–1.20)
GFR: 82.64 mL/min (ref >60.00)
Glucose, Bld: 191 mg/dL — ABNORMAL HIGH (ref 70–99)
Potassium: 4.5 mEq/L (ref 3.5–5.1)
Sodium: 136 mEq/L (ref 135–145)
Total Bilirubin: 0.4 mg/dL (ref 0.2–1.2)
Total Protein: 6.8 g/dL (ref 6.0–8.3)

## 2019-02-11 ENCOUNTER — Telehealth: Payer: Self-pay | Admitting: *Deleted

## 2019-02-11 ENCOUNTER — Ambulatory Visit (INDEPENDENT_AMBULATORY_CARE_PROVIDER_SITE_OTHER)
Admission: RE | Admit: 2019-02-11 | Discharge: 2019-02-11 | Disposition: A | Discharge status: Home / Self Care | Payer: Medicare Other | Source: Ambulatory Visit | Attending: Gastroenterology | Admitting: Gastroenterology

## 2019-02-11 ENCOUNTER — Other Ambulatory Visit: Payer: Self-pay

## 2019-02-11 DIAGNOSIS — R1030 Lower abdominal pain, unspecified: Secondary | ICD-10-CM | POA: Diagnosis not present

## 2019-02-11 DIAGNOSIS — R1013 Epigastric pain: Secondary | ICD-10-CM | POA: Diagnosis not present

## 2019-02-11 DIAGNOSIS — D509 Iron deficiency anemia, unspecified: Secondary | ICD-10-CM

## 2019-02-11 DIAGNOSIS — R195 Other fecal abnormalities: Secondary | ICD-10-CM

## 2019-02-11 DIAGNOSIS — K921 Melena: Secondary | ICD-10-CM | POA: Diagnosis not present

## 2019-02-11 MED ORDER — IOHEXOL 300 MG/ML  SOLN
100.0000 mL | Freq: Once | INTRAMUSCULAR | Status: AC | PRN
  Administered 2019-02-11: 100 mL via INTRAVENOUS

## 2019-02-11 NOTE — Telephone Encounter (Signed)
Covid-19 travel screening questions  Have you traveled in the last 14 days? If yes where? No Do you now or have you had a fever in the last 14 days? No Do you have any respiratory symptoms of shortness of breath or cough now or in the last 14 days? No Do you have any family members or close contacts with diagnosed or suspected Covid-19? No      

## 2019-02-16 ENCOUNTER — Other Ambulatory Visit: Payer: Self-pay

## 2019-02-16 MED ORDER — RANOLAZINE ER 1000 MG PO TB12: 1000.0000 mg | ORAL_TABLET | Freq: Two times a day (BID) | ORAL | 3 refills | Status: DC

## 2019-02-16 NOTE — Telephone Encounter (Signed)
Rx(s) sent to pharmacy electronically.  

## 2019-03-18 ENCOUNTER — Telehealth: Payer: Self-pay | Admitting: Cardiology

## 2019-03-18 ENCOUNTER — Telehealth: Payer: Self-pay | Admitting: Family Medicine

## 2019-03-18 DIAGNOSIS — R531 Weakness: Secondary | ICD-10-CM

## 2019-03-18 NOTE — Telephone Encounter (Signed)
New Message        Pt c/o medication issue:  1. Name of Medication: Carvedilol   2. How are you currently taking this medication (dosage and times per day)? 12.5mg  2x daily   3. Are you having a reaction (difficulty breathing--STAT)? No, but she does get SOB   4. What is your medication issue? Pts bp is 98/62 and they are wondering if she needs to change her medication Pt has been feeling weakness and off balance

## 2019-03-18 NOTE — Telephone Encounter (Signed)
[facility] care nurse Almyra Free is at the home now. States the pt is feeling in general lightheadedness, weakness and fatigue. Had work up recently with gastro for possible bleed/ was placed on iron pills, no bleed found. Pt would like to know if any of her meds could explain why she feels this way and if any meds can be adjusted.  Current bp 98/62 p 66, c/o sob with activities.  Almyra Free was told Dr/nurse out today but will be called back tomorrow with f/u call.  Pt was told she will be called back with instructions. Both were accepting of plan.

## 2019-03-18 NOTE — Telephone Encounter (Signed)
In home or outpatient?   If outpt -[place] or [place]?  Thanks - happy to do the referral

## 2019-03-18 NOTE — Telephone Encounter (Signed)
Anemia could make her feel this way. BP is a little low so I would reduce Coreg to 6.25 mg bid.  Shawan Tosh Martinique MD, [facility]

## 2019-03-18 NOTE — Telephone Encounter (Signed)
Julie,[facility],called requesting a referral for physical therapy for patient.  Patient has generalized weakness in both arms and legs. Patient stumbles and loses her balance due to the weakness.

## 2019-03-19 NOTE — Telephone Encounter (Signed)
Diane [name] said pt is requesting in home PT

## 2019-03-20 MED ORDER — CARVEDILOL 12.5 MG PO TABS: ORAL_TABLET | ORAL | 6 refills | Status: DC

## 2019-03-20 NOTE — Telephone Encounter (Signed)
Spoke to patient Dr.Jordan advised to decrease Coreg to 6.25 mg twice a day.She stated B/P today 88/53.Hospice Dr.advised her to hold HCTZ for 2 days.Advised to call back if B/P continues to be low.

## 2019-03-20 NOTE — Addendum Note (Signed)
Addended by: Kathyrn Lass on: 03/20/2019 05:36 PM   Modules accepted: Orders

## 2019-03-23 ENCOUNTER — Telehealth: Payer: Self-pay | Admitting: Cardiology

## 2019-03-23 NOTE — Telephone Encounter (Signed)
  Diane [name] from [facility] is calling to update Dr Martinique that over the weekend she was having an issue of blood pressure being low and one of [facility] providers told her to hold the hydrochlorothiazide (HYDRODIURIL) 50 MG tablet.  Diane [name] would like to know if it is okay for her to stay off the hydrochlorothiazide or should she go back on it. Please call and let her know

## 2019-03-23 NOTE — Telephone Encounter (Signed)
Returned call to [place] with [facility].Advised on 04-06-2023 Dr.Jordan deceased Coreg to 6.25 mg twice a day.Patient was told by Hospice Dr to hold HCTZ for 2 days last week.Stated patient just woke up and has not checked her B/P.Advised she should restart HCTZ as prescribed and call back if B/P continues to be low.

## 2019-03-24 DIAGNOSIS — R531 Weakness: Secondary | ICD-10-CM | POA: Insufficient documentation

## 2019-03-25 NOTE — Telephone Encounter (Signed)
Home Health PT order faxed to [place] at [facility] Fax# [phone]

## 2019-03-25 NOTE — Telephone Encounter (Signed)
Was missing face to face date. I put in 12/05/2018 and signed.

## 2019-03-27 DIAGNOSIS — M797 Fibromyalgia: Secondary | ICD-10-CM | POA: Diagnosis not present

## 2019-03-27 DIAGNOSIS — Z7982 Long term (current) use of aspirin: Secondary | ICD-10-CM | POA: Diagnosis not present

## 2019-03-27 DIAGNOSIS — J9620 Acute and chronic respiratory failure, unspecified whether with hypoxia or hypercapnia: Secondary | ICD-10-CM | POA: Diagnosis not present

## 2019-03-27 DIAGNOSIS — I252 Old myocardial infarction: Secondary | ICD-10-CM | POA: Diagnosis not present

## 2019-03-27 DIAGNOSIS — Z7951 Long term (current) use of inhaled steroids: Secondary | ICD-10-CM | POA: Diagnosis not present

## 2019-03-27 DIAGNOSIS — I2510 Atherosclerotic heart disease of native coronary artery without angina pectoris: Secondary | ICD-10-CM | POA: Diagnosis not present

## 2019-03-27 DIAGNOSIS — I5042 Chronic combined systolic (congestive) and diastolic (congestive) heart failure: Secondary | ICD-10-CM | POA: Diagnosis not present

## 2019-03-27 DIAGNOSIS — G40009 Localization-related (focal) (partial) idiopathic epilepsy and epileptic syndromes with seizures of localized onset, not intractable, without status epilepticus: Secondary | ICD-10-CM | POA: Diagnosis not present

## 2019-03-27 DIAGNOSIS — I255 Ischemic cardiomyopathy: Secondary | ICD-10-CM | POA: Diagnosis not present

## 2019-03-27 DIAGNOSIS — I110 Hypertensive heart disease with heart failure: Secondary | ICD-10-CM | POA: Diagnosis not present

## 2019-03-27 DIAGNOSIS — F17210 Nicotine dependence, cigarettes, uncomplicated: Secondary | ICD-10-CM | POA: Diagnosis not present

## 2019-03-27 DIAGNOSIS — Z7984 Long term (current) use of oral hypoglycemic drugs: Secondary | ICD-10-CM | POA: Diagnosis not present

## 2019-03-27 DIAGNOSIS — Z8673 Personal history of transient ischemic attack (TIA), and cerebral infarction without residual deficits: Secondary | ICD-10-CM | POA: Diagnosis not present

## 2019-03-27 DIAGNOSIS — Z951 Presence of aortocoronary bypass graft: Secondary | ICD-10-CM | POA: Diagnosis not present

## 2019-03-27 DIAGNOSIS — J449 Chronic obstructive pulmonary disease, unspecified: Secondary | ICD-10-CM | POA: Diagnosis not present

## 2019-03-27 DIAGNOSIS — E1142 Type 2 diabetes mellitus with diabetic polyneuropathy: Secondary | ICD-10-CM | POA: Diagnosis not present

## 2019-03-30 ENCOUNTER — Telehealth: Payer: Self-pay | Admitting: Family Medicine

## 2019-03-30 NOTE — Telephone Encounter (Signed)
Cecilie Lowers Physical therapist with [facility] called to have a message sent back to the patient's PCP He stated that they were in the home last week with the patient and completed the patient's 60 day care.    1 week one  2 weeks 4  1 week [place] [phone]

## 2019-03-30 NOTE — Telephone Encounter (Signed)
Please verbally ok if needed Thanks !

## 2019-03-31 ENCOUNTER — Other Ambulatory Visit: Payer: Self-pay | Admitting: Neurology

## 2019-03-31 DIAGNOSIS — J449 Chronic obstructive pulmonary disease, unspecified: Secondary | ICD-10-CM | POA: Diagnosis not present

## 2019-03-31 DIAGNOSIS — I110 Hypertensive heart disease with heart failure: Secondary | ICD-10-CM | POA: Diagnosis not present

## 2019-03-31 DIAGNOSIS — J9620 Acute and chronic respiratory failure, unspecified whether with hypoxia or hypercapnia: Secondary | ICD-10-CM | POA: Diagnosis not present

## 2019-03-31 DIAGNOSIS — I2510 Atherosclerotic heart disease of native coronary artery without angina pectoris: Secondary | ICD-10-CM | POA: Diagnosis not present

## 2019-03-31 DIAGNOSIS — I255 Ischemic cardiomyopathy: Secondary | ICD-10-CM | POA: Diagnosis not present

## 2019-03-31 DIAGNOSIS — I5042 Chronic combined systolic (congestive) and diastolic (congestive) heart failure: Secondary | ICD-10-CM | POA: Diagnosis not present

## 2019-03-31 NOTE — Telephone Encounter (Signed)
Left Cecilie Lowers a VM giving him the verbal order if he needed it, and advised he could call back to clarify the message if he needed to

## 2019-04-02 DIAGNOSIS — I5042 Chronic combined systolic (congestive) and diastolic (congestive) heart failure: Secondary | ICD-10-CM | POA: Diagnosis not present

## 2019-04-02 DIAGNOSIS — I110 Hypertensive heart disease with heart failure: Secondary | ICD-10-CM | POA: Diagnosis not present

## 2019-04-02 DIAGNOSIS — J449 Chronic obstructive pulmonary disease, unspecified: Secondary | ICD-10-CM | POA: Diagnosis not present

## 2019-04-02 DIAGNOSIS — I2510 Atherosclerotic heart disease of native coronary artery without angina pectoris: Secondary | ICD-10-CM | POA: Diagnosis not present

## 2019-04-02 DIAGNOSIS — J9620 Acute and chronic respiratory failure, unspecified whether with hypoxia or hypercapnia: Secondary | ICD-10-CM | POA: Diagnosis not present

## 2019-04-02 DIAGNOSIS — I255 Ischemic cardiomyopathy: Secondary | ICD-10-CM | POA: Diagnosis not present

## 2019-04-07 DIAGNOSIS — I110 Hypertensive heart disease with heart failure: Secondary | ICD-10-CM | POA: Diagnosis not present

## 2019-04-07 DIAGNOSIS — J9620 Acute and chronic respiratory failure, unspecified whether with hypoxia or hypercapnia: Secondary | ICD-10-CM | POA: Diagnosis not present

## 2019-04-07 DIAGNOSIS — J449 Chronic obstructive pulmonary disease, unspecified: Secondary | ICD-10-CM | POA: Diagnosis not present

## 2019-04-07 DIAGNOSIS — I5042 Chronic combined systolic (congestive) and diastolic (congestive) heart failure: Secondary | ICD-10-CM | POA: Diagnosis not present

## 2019-04-07 DIAGNOSIS — I2510 Atherosclerotic heart disease of native coronary artery without angina pectoris: Secondary | ICD-10-CM | POA: Diagnosis not present

## 2019-04-07 DIAGNOSIS — I255 Ischemic cardiomyopathy: Secondary | ICD-10-CM | POA: Diagnosis not present

## 2019-04-09 DIAGNOSIS — I5042 Chronic combined systolic (congestive) and diastolic (congestive) heart failure: Secondary | ICD-10-CM | POA: Diagnosis not present

## 2019-04-09 DIAGNOSIS — J9620 Acute and chronic respiratory failure, unspecified whether with hypoxia or hypercapnia: Secondary | ICD-10-CM | POA: Diagnosis not present

## 2019-04-09 DIAGNOSIS — I110 Hypertensive heart disease with heart failure: Secondary | ICD-10-CM | POA: Diagnosis not present

## 2019-04-09 DIAGNOSIS — J449 Chronic obstructive pulmonary disease, unspecified: Secondary | ICD-10-CM | POA: Diagnosis not present

## 2019-04-09 DIAGNOSIS — I255 Ischemic cardiomyopathy: Secondary | ICD-10-CM | POA: Diagnosis not present

## 2019-04-09 DIAGNOSIS — I2510 Atherosclerotic heart disease of native coronary artery without angina pectoris: Secondary | ICD-10-CM | POA: Diagnosis not present

## 2019-04-13 ENCOUNTER — Telehealth: Payer: Self-pay

## 2019-04-13 DIAGNOSIS — J449 Chronic obstructive pulmonary disease, unspecified: Secondary | ICD-10-CM | POA: Diagnosis not present

## 2019-04-13 DIAGNOSIS — I255 Ischemic cardiomyopathy: Secondary | ICD-10-CM | POA: Diagnosis not present

## 2019-04-13 DIAGNOSIS — I110 Hypertensive heart disease with heart failure: Secondary | ICD-10-CM | POA: Diagnosis not present

## 2019-04-13 DIAGNOSIS — I2510 Atherosclerotic heart disease of native coronary artery without angina pectoris: Secondary | ICD-10-CM | POA: Diagnosis not present

## 2019-04-13 DIAGNOSIS — J9620 Acute and chronic respiratory failure, unspecified whether with hypoxia or hypercapnia: Secondary | ICD-10-CM | POA: Diagnosis not present

## 2019-04-13 DIAGNOSIS — I5042 Chronic combined systolic (congestive) and diastolic (congestive) heart failure: Secondary | ICD-10-CM | POA: Diagnosis not present

## 2019-04-13 NOTE — Telephone Encounter (Signed)
Patients physical therapist, Joaquim Lai, calls to report concern.  Patient is experiencing acute dizziness, weakness, nausea and lightheadedness.  BP: 75/50, HR 73, 02 sat 97%, BS 222.  PT did have patient lay down on couch and has been pushing fluids X 56mins without improvement in symptoms and asks for guidance as to what to do with patient?  She denies any visible bleeding in urine or with bm's or emesis.  Denies any dark or tarry stools.  No chest pain, shortness of breath or numbness and tingling.   Discussed symptoms with provider and given patients medical hx it was determined best that she go to the ER for further work up and eval.  Joaquim Lai (PT) made aware and is assisting patient with transfer to ER via EMS.

## 2019-04-13 NOTE — Telephone Encounter (Signed)
Aware-will watch for notes

## 2019-04-14 ENCOUNTER — Telehealth: Payer: Self-pay | Admitting: Cardiology

## 2019-04-14 NOTE — Telephone Encounter (Signed)
New Message     Pt called Aris Georgia about her BP  Yesterday BP 78/58 Pt states she is not going to take her medication today because she is feeling okay  They adjusted the Carvedilol she is currently taking 6.25mg  2x daily and Hydrochlorothiazide 25mg  daily    Please call back

## 2019-04-14 NOTE — Telephone Encounter (Signed)
Mcarthur Rossetti, RN back regarding patient. She reports that the patient had a low blood pressure reading yesterday of 78/58 and the patient has held carvedilol and hydrochlorothiazide today. She has had issues with her blood pressure dropping recently.  Her blood pressure is within normal limits today. Almyra Free wants to know if she should continue holding for now. I will consult with Dr. Martinique to verify.

## 2019-04-14 NOTE — Telephone Encounter (Signed)
I would hold HCTZ now but resume Coreg.  Aspyn Warnke Martinique MD, [facility]

## 2019-04-15 DIAGNOSIS — J449 Chronic obstructive pulmonary disease, unspecified: Secondary | ICD-10-CM | POA: Diagnosis not present

## 2019-04-15 DIAGNOSIS — I255 Ischemic cardiomyopathy: Secondary | ICD-10-CM | POA: Diagnosis not present

## 2019-04-15 DIAGNOSIS — I110 Hypertensive heart disease with heart failure: Secondary | ICD-10-CM | POA: Diagnosis not present

## 2019-04-15 DIAGNOSIS — I5042 Chronic combined systolic (congestive) and diastolic (congestive) heart failure: Secondary | ICD-10-CM | POA: Diagnosis not present

## 2019-04-15 DIAGNOSIS — I2510 Atherosclerotic heart disease of native coronary artery without angina pectoris: Secondary | ICD-10-CM | POA: Diagnosis not present

## 2019-04-15 DIAGNOSIS — J9620 Acute and chronic respiratory failure, unspecified whether with hypoxia or hypercapnia: Secondary | ICD-10-CM | POA: Diagnosis not present

## 2019-04-15 NOTE — Telephone Encounter (Signed)
Called patient no answer.LMTC. 

## 2019-04-15 NOTE — Telephone Encounter (Signed)
LMVM-Julie from [facility]

## 2019-04-17 ENCOUNTER — Encounter: Payer: Self-pay | Admitting: Pulmonary Disease

## 2019-04-17 ENCOUNTER — Ambulatory Visit (INDEPENDENT_AMBULATORY_CARE_PROVIDER_SITE_OTHER): Payer: Medicare Other | Admitting: Pulmonary Disease

## 2019-04-17 ENCOUNTER — Other Ambulatory Visit: Payer: Self-pay

## 2019-04-17 VITALS — BP 110/56 | HR 75 | Temp 97.4°F | Ht <= 58 in | Wt 143.4 lb

## 2019-04-17 DIAGNOSIS — I255 Ischemic cardiomyopathy: Secondary | ICD-10-CM

## 2019-04-17 DIAGNOSIS — G4734 Idiopathic sleep related nonobstructive alveolar hypoventilation: Secondary | ICD-10-CM

## 2019-04-17 DIAGNOSIS — J439 Emphysema, unspecified: Secondary | ICD-10-CM

## 2019-04-17 DIAGNOSIS — F17200 Nicotine dependence, unspecified, uncomplicated: Secondary | ICD-10-CM

## 2019-04-17 DIAGNOSIS — J449 Chronic obstructive pulmonary disease, unspecified: Secondary | ICD-10-CM

## 2019-04-17 NOTE — Progress Notes (Signed)
[name]  Chief Complaint  Patient presents with  . Follow-up    increased weakness, lightheadedness when more since sick last fall    Constitutional:  BP (!) 110/56 (BP Location: Right Arm, Cuff Size: Normal)   Pulse 75   Temp (!) 97.4 F (36.3 C) (Oral)   Ht 4' 9.5" (1.461 m)   Wt 143 lb 6.4 oz (65 kg)   LMP 09/02/2003   SpO2 99%   BMI 30.49 kg/m   Past Medical History:  Combined CHF, CAD s/p CABG, CVA, DM, HLD, HTN, Fibromyalgia, GERD, Goiter, Pneumonia, Seizures  Brief Summary:  Diane [name] is a 63 y.o. female smoker with chronic bronchitis and emphysema and sleep related hypoxia.  She is down to 1 ppd.  She has tried chantix and bupropion in the past, but neither helped.  She is using nicotine patch and gum.  She would like to stick with this regimen.  She gets daily cough with white to brown sputum.  Gets intermittent wheezing.  Not having fever, chest pain, or hemoptysis.  Tried ICS and LABA inhalers before, but didn't seem to help.  Uses albuterol daily and this helps her symptoms.  She hasn't used oxygen at night in almost one year.  She wasn't sure if she still needed it and the cannula would keep falling off when she was asleep.  She still has oxygen concentrator.  She was getting episodes of dizziness.  Her blood pressure was running low.  She had her blood pressure medications adjusted.   Physical Exam:   Appearance - well kempt   ENMT - clear nasal mucosa, midline nasal  septum, no oral exudates, no LAN, trachea midline, raspy voice  Respiratory - normal chest wall, normal respiratory effort, no accessory muscle use, no wheeze/rales  CV - s1s2 regular rate and rhythm, no murmurs, no peripheral edema, radial pulses symmetric  GI - soft, non tender, no masses  Lymph - no adenopathy noted in neck and axillary areas  MSK - normal gait  Ext - no cyanosis, clubbing, or joint inflammation noted  Skin - no  rashes, lesions, or ulcers  Neuro - normal strength, oriented x 3  Psych - normal mood and affect  Assessment/Plan:   Chronic bronchitis, centrilobular emphysema and paraseptal emphysema. - she has tried ICS, LAMA, LABA inhalers in the past w/o significant improvement - continue prn albuterol  Chronic respiratory failure with hypoxia. - hasn't been using nocturnal oxygen - will arrange for ONO with room air and then determine if she needs to resume supplemental oxygen at night  Tobacco abuse. - had extensive d/w her about options for smoking cessation - she prefers to use nicotine patch/gum for now  Lung cancer screening. - reviewed her CT chest results from September 2019 with her - she will have f/u LDCT chest in September 2020   Patient Instructions  Will arrange for overnight oxygen test and call with results  Follow up in 6 months    [name], MD [place] Pager: [phone] 04/17/2019, 12:19 PM  Flow Sheet     Pulmonary tests:  PFT 05/12/12>>FEV1 1.83 (98%), FEV1% 80, TLC 3.83 (98%), DLCO 61% (corrects for lung volumes), no BD response PFT 06/30/14>>FEV1 1.80 (93%), FEV1% 86, TLC 4.35 (108%), DLCO 82%, no BD Spirometry 05/11/16 >> FEV1 1.74 (89%), FEV1% 76  PFT 07/15/18 >> FEV1 1.91 (109%), FEV1% 87, TLC 4.34 (111%), DLCO 81%  Chest imaging:  LDCT Chest 06/25/18 >>  several small nodules b/l, bronchial wall thickening, mild centrilobular and paraseptal emphysema  Sleep tests:  PSG 04/10/06>>RDI 105  CPAP 06/13/11 to 07/10/11>>Used on 20 of 28 nights with median 3 hrs 45 min. Average AHI 5 with median CPAP 7 cm H2O and 95th percentile CPAP 10 cm H2O. PSG 05/15/16 >> AHI 1.9, SpO2 low 83% ONO with RA 07/27/16 >>test time 6 hrs 25 min. Mean SpO2 91.04%, low SpO2 68%. Spent 18 min with SpO2 <88%.  Cardiac tests:  Echo 04/25/15 >> EF 16%, grade 2 diastolic CHF, mod MR, PAS 45 mmHg  Medications:   Allergies as of 04/17/2019       Reactions   Bee Venom Itching, Swelling   Entresto [sacubitril-valsartan] Cough   Tetracycline    Unknown    Ace Inhibitors Cough      Medication List       Accurate as of April 17, 2019 12:19 PM. If you have any questions, ask your nurse or doctor.        acetaminophen 500 MG tablet Commonly known as: TYLENOL Take 1,000 mg by mouth every 6 (six) hours as needed for moderate pain or headache.   albuterol 108 (90 Base) MCG/ACT inhaler Commonly known as: Ventolin HFA INHALER 2 PUFFS EVERY 6 HOURS AS NEEDED FOR WHEEZING/SHORTNESS OF BREATH   aspirin EC 81 MG tablet Take 81 mg by mouth every evening.   carvedilol 12.5 MG tablet Commonly known as: COREG Take 1/2 tablet ( 6.25 mg ) twice a day   cetirizine 10 MG tablet Commonly known as: ZYRTEC Take 10 mg by mouth daily as needed for allergies.   clopidogrel 75 MG tablet Commonly known as: PLAVIX TAKE 1 TABLET BY MOUTH EVERY DAY   dexlansoprazole 60 MG capsule Commonly known as: Dexilant TAKE 1 CAPSULE BY MOUTH EVERY DAY   diphenhydrAMINE 25 mg capsule Commonly known as: BENADRYL Take 2 capsules in AM and 1 capsule at night What changed:   how much to take  how to take this  when to take this  reasons to take this  additional instructions   fluticasone 50 MCG/ACT nasal spray Commonly known as: FLONASE Place 2 sprays into both nostrils daily.   furosemide 40 MG tablet Commonly known as: LASIX TAKE 1 TABLET BY MOUTH EVERY DAY   gabapentin 300 MG capsule Commonly known as: NEURONTIN TAKE 2 CAPSULES BY MOUTH EVERY DAY AT BEDTIME What changed: See the new instructions.   glimepiride 2 MG tablet Commonly known as: AMARYL Take 2 mg by mouth 2 (two) times daily.   guaiFENesin 600 MG 12 hr tablet Commonly known as: MUCINEX Take 2 tablets (1,200 mg total) by mouth 2 (two) times daily.   hydrochlorothiazide 50 MG tablet Commonly known as: HYDRODIURIL TAKE 1 TABLET BY MOUTH EVERY DAY   iron  polysaccharides 150 MG capsule Commonly known as: Nu-Iron Take 1 capsule (150 mg total) by mouth 2 (two) times daily.   isosorbide mononitrate 30 MG 24 hr tablet Commonly known as: IMDUR TAKE 1 TABLET BY MOUTH EVERY DAY   Klor-Con M10 10 MEQ tablet Generic drug: potassium chloride TAKE 1 TABLET BY MOUTH TWICE A DAY   levETIRAcetam 1000 MG tablet Commonly known as: KEPPRA TAKE 1 TABLET BY MOUTH TWICE A DAY   losartan 50 MG tablet Commonly known as: COZAAR Take 1 tablet (50 mg total) by mouth daily. What changed: how much to take   metFORMIN 500 MG 24 hr tablet Commonly known as: GLUCOPHAGE-XR Take 500 mg  by mouth 2 (two) times daily.   nicotine polacrilex 4 MG gum Commonly known as: Nicorette Take 1 each (4 mg total) by mouth as needed for smoking cessation.   nitroGLYCERIN 0.4 MG/SPRAY spray Commonly known as: NITROLINGUAL PLACE 1 SPRAY UNDER THE TONGUE EVERY 5 (FIVE) MINUTES X 3 DOSES AS NEEDED FOR CHEST PAIN.   ONE TOUCH ULTRA TEST test strip Generic drug: glucose blood USE AS DIRECTED FOR TESTING BLOOD GLUCOSE 3 TIMES DAILY   polyethylene glycol powder 17 GM/SCOOP powder Commonly known as: GLYCOLAX/MIRALAX TAKE 1 DOSE 17 GRAMS IN 8 OZ OF WATER DAILY AS NEEDED FOR CONSTIPATION. What changed: See the new instructions.   ranolazine 1000 MG SR tablet Commonly known as: RANEXA Take 1 tablet (1,000 mg total) by mouth 2 (two) times daily.   rosuvastatin 10 MG tablet Commonly known as: CRESTOR TAKE 1 TABLET BY MOUTH EVERY DAY   sodium chloride 0.65 % Soln nasal spray Commonly known as: OCEAN Place 1 spray into both nostrils as needed for congestion.       Past Surgical History:  She  has a past surgical history that includes Coronary stent placement (08/11/06); Cardiac catheterization (11/29/05); Cardiac catheterization (08/06/06); Coronary artery bypass graft (12/04/2005); Cholecystectomy; Appendectomy; Cervical fusion (1990); Cardiac catheterization (N/A, 05/06/2015);  laparotomy (Bilateral, 05/19/2015); Cardiac catheterization (N/A, 11/15/2015); Cardiac catheterization (11/15/2015); Colon resection; Colonoscopy with propofol (N/A, 07/16/2017); CAROTID PTA/STENT INTERVENTION (N/A, 10/10/2017); Breast biopsy (Left, 2016); Esophagogastroduodenoscopy (egd) with propofol (N/A, 02/03/2019); and biopsy (02/03/2019).  Family History:  Her family history includes COPD in her mother; Cancer in her father, paternal uncle, and sister; Diabetes in her mother; Drug abuse in her paternal grandmother; Glaucoma in her sister; Heart attack in her father; Heart disease in her maternal aunt, maternal grandfather, and mother; Hyperlipidemia in her mother; Hypertension in her mother and sister; Lung cancer in her paternal aunt and paternal uncle; Melanoma in her paternal uncle; Stroke in her paternal grandfather.  Social History:  She  reports that she has been smoking cigarettes. She started smoking about 53 years ago. She has a 102.00 pack-year smoking history. She has never used smokeless tobacco. She reports that she does not drink alcohol or use drugs.

## 2019-04-17 NOTE — Patient Instructions (Signed)
Will arrange for overnight oxygen test and call with results Follow up in 6 months 

## 2019-04-17 NOTE — Telephone Encounter (Signed)
Spoke with julie, aware of dr Doug Sou recommendations. She will get in contact with the patient.

## 2019-04-19 ENCOUNTER — Telehealth: Payer: Self-pay | Admitting: Family Medicine

## 2019-04-19 DIAGNOSIS — E538 Deficiency of other specified B group vitamins: Secondary | ICD-10-CM

## 2019-04-19 DIAGNOSIS — I10 Essential (primary) hypertension: Secondary | ICD-10-CM

## 2019-04-19 DIAGNOSIS — D509 Iron deficiency anemia, unspecified: Secondary | ICD-10-CM

## 2019-04-19 DIAGNOSIS — E7800 Pure hypercholesterolemia, unspecified: Secondary | ICD-10-CM

## 2019-04-19 NOTE — Telephone Encounter (Signed)
-----   Message from Cloyd Stagers, RT sent at 04/16/2019  1:07 PM EDT ----- Regarding: Lab Orders for Tuesday 7.21.2020 Please place lab orders for Tuesday 7.21.2020, office visit for physical on Thursday 7.23.2020 Thank you, Dyke Maes RT(R)

## 2019-04-20 ENCOUNTER — Ambulatory Visit (INDEPENDENT_AMBULATORY_CARE_PROVIDER_SITE_OTHER): Payer: Medicare Other

## 2019-04-20 VITALS — BP 140/78 | Ht <= 58 in | Wt 141.0 lb

## 2019-04-20 DIAGNOSIS — Z0000 Encounter for general adult medical examination without abnormal findings: Secondary | ICD-10-CM

## 2019-04-20 DIAGNOSIS — I2510 Atherosclerotic heart disease of native coronary artery without angina pectoris: Secondary | ICD-10-CM | POA: Diagnosis not present

## 2019-04-20 DIAGNOSIS — J9620 Acute and chronic respiratory failure, unspecified whether with hypoxia or hypercapnia: Secondary | ICD-10-CM | POA: Diagnosis not present

## 2019-04-20 DIAGNOSIS — I110 Hypertensive heart disease with heart failure: Secondary | ICD-10-CM | POA: Diagnosis not present

## 2019-04-20 DIAGNOSIS — J449 Chronic obstructive pulmonary disease, unspecified: Secondary | ICD-10-CM | POA: Diagnosis not present

## 2019-04-20 DIAGNOSIS — I5042 Chronic combined systolic (congestive) and diastolic (congestive) heart failure: Secondary | ICD-10-CM | POA: Diagnosis not present

## 2019-04-20 DIAGNOSIS — I255 Ischemic cardiomyopathy: Secondary | ICD-10-CM | POA: Diagnosis not present

## 2019-04-20 NOTE — Progress Notes (Signed)
PCP notes:  Health Maintenance:  Due for eye exam and A1C. PPSV23 due in September  Abnormal Screenings:  None  Patient concerns:  Patient states she is not feeling better since starting iron.  Nurse concerns:  Patient has a cough that she said is not getting better since last visit. Says bathing tires her out and sometimes causes chest pain.  Next PCP appt.: 04/23/2019 at 11:30

## 2019-04-20 NOTE — Patient Instructions (Signed)
[name] , Thank you for taking time to come for your Medicare Wellness Visit. I appreciate your ongoing commitment to your health goals. Please review the following plan we discussed and let me know if I can assist you in the future.   Screening recommendations/referrals: Colonoscopy: 07/2017 Mammogram: 07/2018 Bone Density: 07/2018 Recommended yearly ophthalmology/optometry visit for glaucoma screening and checkup Recommended yearly dental visit for hygiene and checkup  Vaccinations: Influenza vaccine: 07/2018 Pneumococcal vaccine: PPSV23 due in September Tdap vaccine: 11/2009 Shingles vaccine: discussed    Advanced directives: Please bring a copy of your POA (Power of [place]) and/or Living Will to your next appointment.    Conditions/risks identified: smoking  Next appointment: 04/23/2019 at 11:30   Preventive Care 27 Years and Older, Female Preventive care refers to lifestyle choices and visits with your health care provider that can promote health and wellness. What does preventive care include?  A yearly physical exam. This is also called an annual well check.  Dental exams once or twice a year.  Routine eye exams. Ask your health care provider how often you should have your eyes checked.  Personal lifestyle choices, including:  Daily care of your teeth and gums.  Regular physical activity.  Eating a healthy diet.  Avoiding tobacco and drug use.  Limiting alcohol use.  Practicing safe sex.  Taking low-dose aspirin every day.  Taking vitamin and mineral supplements as recommended by your health care provider. What happens during an annual well check? The services and screenings done by your health care provider during your annual well check will depend on your age, overall health, lifestyle risk factors, and family history of disease. Counseling  Your health care provider may ask you questions about your:  Alcohol use.  Tobacco use.  Drug use.   Emotional well-being.  Home and relationship well-being.  Sexual activity.  Eating habits.  History of falls.  Memory and ability to understand (cognition).  Work and work Statistician.  Reproductive health. Screening  You may have the following tests or measurements:  Height, weight, and BMI.  Blood pressure.  Lipid and cholesterol levels. These may be checked every 5 years, or more frequently if you are over 53 years old.  Skin check.  Lung cancer screening. You may have this screening every year starting at age 29 if you have a 30-pack-year history of smoking and currently smoke or have quit within the past 15 years.  Fecal occult blood test (FOBT) of the stool. You may have this test every year starting at age 62.  Flexible sigmoidoscopy or colonoscopy. You may have a sigmoidoscopy every 5 years or a colonoscopy every 10 years starting at age 44.  Hepatitis C blood test.  Hepatitis B blood test.  Sexually transmitted disease (STD) testing.  Diabetes screening. This is done by checking your blood sugar (glucose) after you have not eaten for a while (fasting). You may have this done every 1-3 years.  Bone density scan. This is done to screen for osteoporosis. You may have this done starting at age 42.  Mammogram. This may be done every 1-2 years. Talk to your health care provider about how often you should have regular mammograms. Talk with your health care provider about your test results, treatment options, and if necessary, the need for more tests. Vaccines  Your health care provider may recommend certain vaccines, such as:  Influenza vaccine. This is recommended every year.  Tetanus, diphtheria, and acellular pertussis (Tdap, Td) vaccine. You may need  a Td booster every 10 years.  Zoster vaccine. You may need this after age 1.  Pneumococcal 13-valent conjugate (PCV13) vaccine. One dose is recommended after age 75.  Pneumococcal polysaccharide (PPSV23)  vaccine. One dose is recommended after age 61. Talk to your health care provider about which screenings and vaccines you need and how often you need them. This information is not intended to replace advice given to you by your health care provider. Make sure you discuss any questions you have with your health care provider. Document Released: 10/14/2015 Document Revised: 06/06/2016 Document Reviewed: 07/19/2015 Elsevier Interactive Patient Education  2017 [employer] Prevention in the Home Falls can cause injuries. They can happen to people of all ages. There are many things you can do to make your home safe and to help prevent falls. What can I do on the outside of my home?  Regularly fix the edges of walkways and driveways and fix any cracks.  Remove anything that might make you trip as you walk through a door, such as a raised step or threshold.  Trim any bushes or trees on the path to your home.  Use bright outdoor lighting.  Clear any walking paths of anything that might make someone trip, such as rocks or tools.  Regularly check to see if handrails are loose or broken. Make sure that both sides of any steps have handrails.  Any raised decks and porches should have guardrails on the edges.  Have any leaves, snow, or ice cleared regularly.  Use sand or salt on walking paths during winter.  Clean up any spills in your garage right away. This includes oil or grease spills. What can I do in the bathroom?  Use night lights.  Install grab bars by the toilet and in the tub and shower. Do not use towel bars as grab bars.  Use non-skid mats or decals in the tub or shower.  If you need to sit down in the shower, use a plastic, non-slip stool.  Keep the floor dry. Clean up any water that spills on the floor as soon as it happens.  Remove soap buildup in the tub or shower regularly.  Attach bath mats securely with double-sided non-slip rug tape.  Do not have throw rugs  and other things on the floor that can make you trip. What can I do in the bedroom?  Use night lights.  Make sure that you have a light by your bed that is easy to reach.  Do not use any sheets or blankets that are too big for your bed. They should not hang down onto the floor.  Have a firm chair that has side arms. You can use this for support while you get dressed.  Do not have throw rugs and other things on the floor that can make you trip. What can I do in the kitchen?  Clean up any spills right away.  Avoid walking on wet floors.  Keep items that you use a lot in easy-to-reach places.  If you need to reach something above you, use a strong step stool that has a grab bar.  Keep electrical cords out of the way.  Do not use floor polish or wax that makes floors slippery. If you must use wax, use non-skid floor wax.  Do not have throw rugs and other things on the floor that can make you trip. What can I do with my stairs?  Do not leave any items on the  stairs.  Make sure that there are handrails on both sides of the stairs and use them. Fix handrails that are broken or loose. Make sure that handrails are as long as the stairways.  Check any carpeting to make sure that it is firmly attached to the stairs. Fix any carpet that is loose or worn.  Avoid having throw rugs at the top or bottom of the stairs. If you do have throw rugs, attach them to the floor with carpet tape.  Make sure that you have a light switch at the top of the stairs and the bottom of the stairs. If you do not have them, ask someone to add them for you. What else can I do to help prevent falls?  Wear shoes that:  Do not have high heels.  Have rubber bottoms.  Are comfortable and fit you well.  Are closed at the toe. Do not wear sandals.  If you use a stepladder:  Make sure that it is fully opened. Do not climb a closed stepladder.  Make sure that both sides of the stepladder are locked into  place.  Ask someone to hold it for you, if possible.  Clearly mark and make sure that you can see:  Any grab bars or handrails.  First and last steps.  Where the edge of each step is.  Use tools that help you move around (mobility aids) if they are needed. These include:  Canes.  Walkers.  Scooters.  Crutches.  Turn on the lights when you go into a dark area. Replace any light bulbs as soon as they burn out.  Set up your furniture so you have a clear path. Avoid moving your furniture around.  If any of your floors are uneven, fix them.  If there are any pets around you, be aware of where they are.  Review your medicines with your doctor. Some medicines can make you feel dizzy. This can increase your chance of falling. Ask your doctor what other things that you can do to help prevent falls. This information is not intended to replace advice given to you by your health care provider. Make sure you discuss any questions you have with your health care provider. Document Released: 07/14/2009 Document Revised: 02/23/2016 Document Reviewed: 10/22/2014 Elsevier Interactive Patient Education  2017 [employer].

## 2019-04-20 NOTE — Progress Notes (Addendum)
Subjective:   Diane [name] is a 63 y.o. female who presents for Medicare Annual (Subsequent) preventive examination.  This visit type was conducted due to national recommendations for restrictions regarding the COVID-19 Pandemic (e.g. social distancing). This format is felt to be most appropriate for this patient at this time. All issues noted in this document were discussed and addressed. No physical exam was performed (except for noted visual exam findings with Video Visits). This patient, [name], has given permission to perform this visit via telephone. Vital signs may be absent or patient reported.  Patient location:  At home  Nurse location:  At home     Review of Systems:  n/a Cardiac Risk Factors include: advanced age (>60mn, >>40women);diabetes mellitus;hypertension;smoking/ tobacco exposure     Objective:     Vitals: BP 140/78 Comment: per patient   Ht 4' 9.75" (1.467 m) Comment: per patient   Wt 141 lb (64 kg) Comment: per patient   LMP 09/02/2003    BMI 29.72 kg/m   Body mass index is 29.72 kg/m.  Advanced Directives 04/20/2019 02/03/2019 02/02/2019 04/14/2018 10/10/2017 07/16/2017 07/08/2017  Does Patient Have a Medical Advance Directive? _0  No Yes  Type of AIndustrial/product designerof AFreescale SemiconductorPower of A[place]eks[place]of Attorney Living will;Healthcare Power of [place] Does patient want to make changes to medical advance directive? No - Patient declined - - - No - Patient declined - -  Copy of HKayceein Chart? No - copy requested - - No - copy requested No - copy requested - No - copy requested  Would patient like information on creating a medical advance directive? - - - No - Patient declined - No - Patient declined -  Pre-existing out of facility DNR order (yellow form or pink MOST form) - - - - - - -    Tobacco Social History    Tobacco Use  Smoking Status Current Every Day Smoker   Packs/day: 1.00   Years: 51.00   Pack years: 51.00   Types: Cigarettes   Start date: 111 Smokeless Tobacco Never Used  Tobacco Comment   04/17/19 down to 1 pack her day--LCR  1.5 pack to 2 packs per day. Has been working on reducing the # of cigarettes she smokes, down to 1/2 PP7D     Ready to quit: Not Answered Counseling given: Not Answered Comment: 04/17/19 down to 1 pack her day--LCR  1.5 pack to 2 packs per day. Has been working on reducing the # of cigarettes she smokes, down to 1/2 PP7D   Clinical Intake:  Pre-visit preparation completed: Yes  Pain : No/denies pain     Nutritional Status: BMI 25 -29 Overweight Nutritional Risks: Nausea/ vomitting/ diarrhea(nauseous in the mornings) Diabetes: Yes CBG done?: No Did pt. bring in CBG monitor from home?: No  How often do you need to have someone help you when you read instructions, pamphlets, or other written materials from your doctor or pharmacy?: 1 - Never What is the last grade level you completed in school?: cosmetology school and early childhood development  Interpreter Needed?: No  Information entered by :: NAllen LPN  Past Medical History:  Diagnosis Date   Anemia    Carotid stenosis    a. s/p right carotid stent 10/2017.   Chronic systolic CHF (congestive heart failure) (HCC)    Colon polyps  COPD (chronic obstructive pulmonary disease) (HCC)    Coronary artery disease    post CABG in 3/07 , coronary stents    Diabetes mellitus    type 2   Dyslipidemia    Fibromyalgia    GERD (gastroesophageal reflux disease)    Headache    hx of    Hyperlipidemia    Hypertension    Myocardial infarction ([place])    Pneumonia    hx of    Seizures ([place])    last seizure- 03/2013    Shortness of breath dyspnea    with exertion or when fluid builds up    Sleep apnea    used to wear a cpap- not used in 3 years    Status post dilation  of esophageal narrowing    Stroke [facility]) 1993   problems with balance    Systolic murmur    known mild AS and MR   Thyroid goiter    Tobacco abuse    Past Surgical History:  Procedure Laterality Date   APPENDECTOMY     BIOPSY  02/03/2019   Procedure: BIOPSY;  Surgeon: Ladene Artist, MD;  Location: [facility];  Service: Endoscopy;;   BREAST BIOPSY Left 2016   CARDIAC CATHETERIZATION  11/29/05   EF of 55%   CARDIAC CATHETERIZATION  08/06/06   EF of 45-50%   CARDIAC CATHETERIZATION N/A 05/06/2015   Procedure: Right/Left Heart Cath and Coronary/Graft Angiography;  Surgeon: Peter M Martinique, MD;  Location: [place] [facility];  Service: Cardiovascular;  Laterality: N/A;   CAROTID PTA/STENT INTERVENTION N/A 10/10/2017   Procedure: CAROTID PTA/STENT INTERVENTION - Right;  Surgeon: Serafina Mitchell, MD;  Location: [place] [facility];  Service: Cardiovascular;  Laterality: N/A;   CERVICAL FUSION  1990   CHOLECYSTECTOMY     COLON RESECTION     mass removed and 4 in of colon   COLONOSCOPY WITH PROPOFOL N/A 07/16/2017   Procedure: COLONOSCOPY WITH PROPOFOL;  Surgeon: Ladene Artist, MD;  [facility]: [facility];  Service: Endoscopy;  Laterality: N/A;   CORONARY ARTERY BYPASS GRAFT  12/04/2005   x5 -- left internal mammary artery to the LAD, left radial artery to the ramus intermedius, saphenous vein graft to the obtuse marginal 1, sequential saphenous vein grat to the acute marginal and posterior descending, endoscopic vein harvesting from the left thigh with open vein harvest from right leg   CORONARY STENT PLACEMENT  08/11/06   PCI of her ciurcumflex/OM vessel   ESOPHAGOGASTRODUODENOSCOPY (EGD) WITH PROPOFOL N/A 02/03/2019   Procedure: ESOPHAGOGASTRODUODENOSCOPY (EGD) WITH PROPOFOL;  Surgeon: Ladene Artist, MD;  [facility];  Service: Endoscopy;  Laterality: N/A;   LAPAROTOMY Bilateral 05/19/2015   Procedure: EXPLORATORY LAPAROTOMY WITH BILATERAL SALPINGO OOPHORECTOMY  /OMENTECTOMY/SEGMENTAL SIGMOID COLECTOMY ;  Surgeon: Everitt Amber, MD;  Location: [facility];  Service: Gynecology;  Laterality: Bilateral;   PERIPHERAL VASCULAR CATHETERIZATION N/A 11/15/2015   Procedure: Carotid PTA/Stent Intervention;  Surgeon: Lorretta Harp, MD;  Location: [place] [facility];  Service: Cardiovascular;  Laterality: N/A;   PERIPHERAL VASCULAR CATHETERIZATION  11/15/2015   Procedure: Carotid Angiography;  Surgeon: Lorretta Harp, MD;  Location: [place] [facility];  Service: Cardiovascular;;   Family History  Problem Relation Age of Onset   Heart disease Mother    Diabetes Mother    COPD Mother    Hyperlipidemia Mother    Hypertension Mother    Cancer Father        met, origin unknown  Heart attack Father    Drug abuse Paternal Grandmother    Stroke Paternal Grandfather    Lung cancer Paternal Aunt        lung with mets to brain   Melanoma Paternal Uncle    Lung cancer Paternal Uncle        lung/liver to brain   Cancer Paternal Uncle        cancer of unknown type   Heart disease Maternal Grandfather    Hypertension Sister    Cancer Sister        eyelid   Glaucoma Sister    Heart disease Maternal Aunt        x 2 aunts   Social History   Socioeconomic History   Marital status: Divorced    Spouse name: Not on file   Number of children: 0   Years of education: Not on file   Highest education level: Not on file  Occupational History   Occupation: disabled  Social Designer, fashion/clothing strain: Not hard at all   Food insecurity    Worry: Never true    Inability: Never true   Transportation needs    Medical: No    Non-medical: No  Tobacco Use   Smoking status: Current Every Day Smoker    Packs/day: 1.00    Years: 51.00    Pack years: 51.00    Types: Cigarettes    Start date: 1967   Smokeless tobacco: Never Used   Tobacco comment: 04/17/19 down to 1 pack her day--LCR  1.5 pack to 2 packs per day. Has been working on  reducing the # of cigarettes she smokes, down to 1/2 PP7D  Substance and Sexual Activity   Alcohol use: No   Drug use: No   Sexual activity: Not Currently    Partners: Male    Birth control/protection: None  Lifestyle   Physical activity    Days per week: 2 days    Minutes per session: 60 min   Stress: Not at all  Relationships   Social connections    Talks on phone: Not on file    Gets together: Not on file    Attends religious service: Not on file    Active member of club or organization: Not on file    Attends meetings of clubs or organizations: Not on file    Relationship status: Not on file  Other Topics Concern   Not on file  Social History Narrative   Not on file    Outpatient Encounter Medications as of 04/20/2019  Medication Sig   acetaminophen (TYLENOL) 500 MG tablet Take 1,000 mg by mouth every 6 (six) hours as needed for moderate pain or headache.   albuterol (VENTOLIN HFA) 108 (90 Base) MCG/ACT inhaler INHALER 2 PUFFS EVERY 6 HOURS AS NEEDED FOR WHEEZING/SHORTNESS OF BREATH   aspirin EC 81 MG tablet Take 81 mg by mouth every evening.    carvedilol (COREG) 12.5 MG tablet Take 1/2 tablet ( 6.25 mg ) twice a day   cetirizine (ZYRTEC) 10 MG tablet Take 10 mg by mouth daily as needed for allergies.   clopidogrel (PLAVIX) 75 MG tablet TAKE 1 TABLET BY MOUTH EVERY DAY   dexlansoprazole (DEXILANT) 60 MG capsule TAKE 1 CAPSULE BY MOUTH EVERY DAY   diphenhydrAMINE (BENADRYL) 25 mg capsule Take 2 capsules in AM and 1 capsule at night (Patient taking differently: Take 50 mg by mouth 2 (two) times daily as needed for itching or allergies. )  fluticasone (FLONASE) 50 MCG/ACT nasal spray Place 2 sprays into both nostrils daily.   furosemide (LASIX) 40 MG tablet TAKE 1 TABLET BY MOUTH EVERY DAY   gabapentin (NEURONTIN) 300 MG capsule TAKE 2 CAPSULES BY MOUTH EVERY DAY AT BEDTIME (Patient taking differently: Take 600 mg by mouth at bedtime. )   glimepiride  (AMARYL) 2 MG tablet Take 4 mg by mouth 2 (two) times daily.    guaiFENesin (MUCINEX) 600 MG 12 hr tablet Take 2 tablets (1,200 mg total) by mouth 2 (two) times daily.   iron polysaccharides (NU-IRON) 150 MG capsule Take 1 capsule (150 mg total) by mouth 2 (two) times daily.   isosorbide mononitrate (IMDUR) 30 MG 24 hr tablet TAKE 1 TABLET BY MOUTH EVERY DAY   KLOR-CON M10 10 MEQ tablet TAKE 1 TABLET BY MOUTH TWICE A DAY   levETIRAcetam (KEPPRA) 1000 MG tablet TAKE 1 TABLET BY MOUTH TWICE A DAY   losartan (COZAAR) 50 MG tablet Take 1 tablet (50 mg total) by mouth daily. (Patient taking differently: Take 25 mg by mouth daily. )   metFORMIN (GLUCOPHAGE-XR) 500 MG 24 hr tablet Take 500 mg by mouth 2 (two) times daily.   nicotine polacrilex (NICORETTE) 4 MG gum Take 1 each (4 mg total) by mouth as needed for smoking cessation.   nitroGLYCERIN (NITROLINGUAL) 0.4 MG/SPRAY spray PLACE 1 SPRAY UNDER THE TONGUE EVERY 5 (FIVE) MINUTES X 3 DOSES AS NEEDED FOR CHEST PAIN.   ONE TOUCH ULTRA TEST test strip USE AS DIRECTED FOR TESTING BLOOD GLUCOSE 3 TIMES DAILY   polyethylene glycol powder (GLYCOLAX/MIRALAX) powder TAKE 1 DOSE 17 GRAMS IN 8 OZ OF WATER DAILY AS NEEDED FOR CONSTIPATION. (Patient taking differently: Take 17 g by mouth daily. Take 1 dose 17 grams in 8 oz of water daily for constipation.)   ranolazine (RANEXA) 1000 MG SR tablet Take 1 tablet (1,000 mg total) by mouth 2 (two) times daily.   rosuvastatin (CRESTOR) 10 MG tablet TAKE 1 TABLET BY MOUTH EVERY DAY   sodium chloride (OCEAN) 0.65 % SOLN nasal spray Place 1 spray into both nostrils as needed for congestion.   hydrochlorothiazide (HYDRODIURIL) 50 MG tablet TAKE 1 TABLET BY MOUTH EVERY DAY (Patient not taking: On hold for now per cardiologist)   No facility-administered encounter medications on file as of 04/20/2019.     Activities of Daily Living In your present state of health, do you have any difficulty performing the  following activities: 04/20/2019  Hearing? N  Vision? Y  Comment just small print  Difficulty concentrating or making decisions? Y  Comment sometimes has trouble since the stroke  Walking or climbing stairs? Y  Dressing or bathing? Y  Comment has some difficulty with bathing getting tired  Doing errands, shopping? N  Preparing Food and eating ? Y  Comment sometimes on waiting list for meals on wheels  Using the Toilet? N  In the past six months, have you accidently leaked urine? N  Do you have problems with loss of bowel control? N  Managing your Medications? N  Managing your Finances? N  Housekeeping or managing your Housekeeping? N  Some recent data might be hidden    Patient Care Team: Tower, [name], MD as PCP - General Martinique, Peter M, MD as PCP - Cardiology (Cardiology) Ralene Ok, MD as Consulting Physician (General Surgery) Everitt Amber, MD as Consulting Physician (Obstetrics and Gynecology) [name], MD as Consulting Physician (Pulmonary Disease) Leo Grosser Seymour Bars, MD as Consulting Physician (  Obstetrics and Gynecology) Jacelyn Pi, MD as Consulting Physician (Endocrinology) Marica Otter, OD (Optometry) Martinique, Peter M, MD as Consulting Physician (Cardiology) Lorretta Harp, MD as Consulting Physician (Cardiology) Bo Merino, MD as Consulting Physician (Rheumatology)    Assessment:   This is a routine wellness examination for Kynisha.  Exercise Activities and Dietary recommendations Current Exercise Habits: Home exercise routine(have PT twice a week), Time (Minutes): 60, Frequency (Times/Week): 2, Weekly Exercise (Minutes/Week): 120, Exercise limited by: respiratory conditions(s)  Goals     Increase physical activity     Starting 04/14/2018, I will continue to exercise in pool 1-3 hours 2 days per week.      Quit smoking / using tobacco     04/20/2019, patient has decreased to 1 pack a day       Fall Risk Fall Risk  04/20/2019 04/28/2018  04/14/2018 10/28/2017 04/29/2017  Falls in the past year? 1 Yes Yes No No  Comment - - slipped on wet floor in restaurant; fractured left wrist and injured right hip - -  Number falls in past yr: _0 - -  Injury with Fall? 1 Yes Yes - -  [place] - - - -  Risk Factor Category  - [place] for fall due to : History of fall(s);Impaired balance/gait;Medication side effect Other (Comment) - - -  Risk for fall due to: Comment - - - - -  Follow up Falls evaluation completed;Falls prevention discussed Falls evaluation completed;Education provided;Falls prevention discussed - - -   Is the patient's home free of loose throw rugs in walkways, pet beds, electrical cords, etc?   yes      Grab bars in the bathroom? yes      Handrails on the stairs?  n/a      Adequate lighting?   yes  Timed Get Up and Go performed: n/a  Depression Screen PHQ 2/9 Scores 04/20/2019 04/14/2018 04/11/2017 06/21/2015  PHQ - 2 Score 0 0 0 0  PHQ- 9 Score 3 0 - -     Cognitive Function MMSE - Mini Mental State Exam 04/20/2019 04/14/2018 04/11/2017  Orientation to time _1 Orientation to Place _2 Registration _3 Attention/ Calculation 5 0 0  Recall _4 Language- name 2 objects 0 0 0  Language- repeat _5 Language- follow 3 step command 0 3 3  Language- read & follow direction 0 0 0  Write a sentence 0 0 0  Copy design 0 0 0  Total score _6 Mini Cog  Mini-Cog screen was completed. Maximum score is 22. A value of 0 denotes this part of the MMSE was not completed or the patient failed this part of the Mini-Cog screening.     Immunization History  Administered Date(s) Administered   Influenza Whole 06/01/2012   Influenza,inj,Quad PF,6+ Mos 06/16/2013, 06/16/2014, 06/21/2015, 08/13/2016, 10/11/2017, 07/15/2018   Pneumococcal Conjugate-13 05/11/2016   Pneumococcal Polysaccharide-23 11/18/1998, 11/04/2008, 06/16/2014   Td 10/01/1996, 11/15/2009     Qualifies for Shingles Vaccine? yes  Screening Tests Health Maintenance  Topic Date Due   OPHTHALMOLOGY EXAM  02/17/2018   HEMOGLOBIN A1C  06/01/2018   HIV Screening  11/03/2023 (Originally 03/10/1971)   PAP SMEAR-Modifier  04/14/2049 (Originally 10/02/2016)   FOOT EXAM  04/22/2019   INFLUENZA VACCINE  05/02/2019   PNA vac Low Risk Adult (  2 of 2 - PPSV23) 06/17/2019   MAMMOGRAM  07/04/2019   TETANUS/TDAP  11/16/2019   COLONOSCOPY  07/17/2027   DEXA SCAN  Completed   Hepatitis C Screening  Completed    Cancer Screenings: Lung: Low Dose CT Chest recommended if Age 75-80 years, 30 pack-year currently smoking OR have quit w/in 15years. Patient does qualify. Breast:  Up to date on Mammogram? Yes   Up to date of Bone Density/Dexa? Yes Colorectal: up to date  Additional Screenings: : Hepatitis C Screening: 08/2016     Plan:    Patient wants to quit smoking. She has cut down to a pack a day. States she gets CT scans annually at the pulmonologist.   I have personally reviewed and noted the following in the patients chart:    Medical and social history  Use of alcohol, tobacco or illicit drugs   Current medications and supplements  Functional ability and status  Nutritional status  Physical activity  Advanced directives  List of other physicians  Hospitalizations, surgeries, and ER visits in previous 12 months  Vitals  Screenings to include cognitive, depression, and falls  Referrals and appointments  In addition, I have reviewed and discussed with patient certain preventive protocols, quality metrics, and best practice recommendations. A written personalized care plan for preventive services as well as general preventive health recommendations were provided to patient.     Kellie Simmering, LPN  [id number]

## 2019-04-21 ENCOUNTER — Ambulatory Visit: Payer: Self-pay

## 2019-04-21 ENCOUNTER — Other Ambulatory Visit (INDEPENDENT_AMBULATORY_CARE_PROVIDER_SITE_OTHER): Payer: Medicare Other

## 2019-04-21 DIAGNOSIS — J449 Chronic obstructive pulmonary disease, unspecified: Secondary | ICD-10-CM | POA: Diagnosis not present

## 2019-04-21 DIAGNOSIS — E7800 Pure hypercholesterolemia, unspecified: Secondary | ICD-10-CM | POA: Diagnosis not present

## 2019-04-21 DIAGNOSIS — D509 Iron deficiency anemia, unspecified: Secondary | ICD-10-CM | POA: Diagnosis not present

## 2019-04-21 DIAGNOSIS — E538 Deficiency of other specified B group vitamins: Secondary | ICD-10-CM | POA: Diagnosis not present

## 2019-04-21 DIAGNOSIS — I5042 Chronic combined systolic (congestive) and diastolic (congestive) heart failure: Secondary | ICD-10-CM | POA: Diagnosis not present

## 2019-04-21 DIAGNOSIS — I10 Essential (primary) hypertension: Secondary | ICD-10-CM

## 2019-04-21 DIAGNOSIS — J9620 Acute and chronic respiratory failure, unspecified whether with hypoxia or hypercapnia: Secondary | ICD-10-CM | POA: Diagnosis not present

## 2019-04-21 DIAGNOSIS — I2510 Atherosclerotic heart disease of native coronary artery without angina pectoris: Secondary | ICD-10-CM | POA: Diagnosis not present

## 2019-04-21 DIAGNOSIS — I110 Hypertensive heart disease with heart failure: Secondary | ICD-10-CM | POA: Diagnosis not present

## 2019-04-21 DIAGNOSIS — I255 Ischemic cardiomyopathy: Secondary | ICD-10-CM | POA: Diagnosis not present

## 2019-04-21 LAB — COMPREHENSIVE METABOLIC PANEL
ALT: 10 U/L (ref 0–35)
AST: 11 U/L (ref 0–37)
Albumin: 4.1 g/dL (ref 3.5–5.2)
Alkaline Phosphatase: 66 U/L (ref 39–117)
BUN: 16 mg/dL (ref 6–23)
CO2: 27 mEq/L (ref 19–32)
Calcium: 9.4 mg/dL (ref 8.4–10.5)
Chloride: 101 mEq/L (ref 96–112)
Creatinine, Ser: 0.8 mg/dL (ref 0.40–1.20)
GFR: 71.96 mL/min (ref >60.00)
Glucose, Bld: 183 mg/dL — ABNORMAL HIGH (ref 70–99)
Potassium: 5.1 mEq/L (ref 3.5–5.1)
Sodium: 135 mEq/L (ref 135–145)
Total Bilirubin: 0.3 mg/dL (ref 0.2–1.2)
Total Protein: 6.6 g/dL (ref 6.0–8.3)

## 2019-04-21 LAB — CBC WITH DIFFERENTIAL/PLATELET
Basophils Absolute: 0 10*3/uL (ref 0.0–0.1)
Basophils Relative: 0.8 % (ref 0.0–3.0)
Eosinophils Absolute: 0.1 10*3/uL (ref 0.0–0.7)
Eosinophils Relative: 1.4 % (ref 0.0–5.0)
HCT: 35.9 % — ABNORMAL LOW (ref 36.0–46.0)
Hemoglobin: 11.3 g/dL — ABNORMAL LOW (ref 12.0–15.0)
Lymphocytes Relative: 25.7 % (ref 12.0–46.0)
Lymphs Abs: 1.3 10*3/uL (ref 0.7–4.0)
MCHC: 31.4 g/dL (ref 30.0–36.0)
MCV: 75 fl — ABNORMAL LOW (ref 78.0–100.0)
Monocytes Absolute: 0.5 10*3/uL (ref 0.1–1.0)
Monocytes Relative: 10.4 % (ref 3.0–12.0)
Neutro Abs: 3.2 10*3/uL (ref 1.4–7.7)
Neutrophils Relative %: 61.7 % (ref 43.0–77.0)
Platelets: 242 10*3/uL (ref 150.0–400.0)
RBC: 4.79 Mil/uL (ref 3.87–5.11)
RDW: 18.9 % — ABNORMAL HIGH (ref 11.5–15.5)
WBC: 5.2 10*3/uL (ref 4.0–10.5)

## 2019-04-21 LAB — TSH: TSH: 1.8 u[IU]/mL (ref 0.35–4.50)

## 2019-04-21 LAB — LIPID PANEL
Cholesterol: 139 mg/dL (ref 0–200)
HDL: 45.1 mg/dL (ref >39.00)
LDL Cholesterol: 61 mg/dL (ref 0–99)
NonHDL: 94
Total CHOL/HDL Ratio: 3
Triglycerides: 163 mg/dL — ABNORMAL HIGH (ref 0.0–149.0)
VLDL: 32.6 mg/dL (ref 0.0–40.0)

## 2019-04-21 LAB — FERRITIN: Ferritin: 5.2 ng/mL — ABNORMAL LOW (ref 10.0–291.0)

## 2019-04-21 LAB — VITAMIN B12: Vitamin B-12: 257 pg/mL (ref 211–911)

## 2019-04-22 ENCOUNTER — Other Ambulatory Visit: Payer: Self-pay

## 2019-04-22 ENCOUNTER — Other Ambulatory Visit (HOSPITAL_COMMUNITY): Payer: Self-pay | Admitting: Cardiovascular Disease

## 2019-04-22 ENCOUNTER — Ambulatory Visit (HOSPITAL_COMMUNITY)
Admission: RE | Admit: 2019-04-22 | Discharge: 2019-04-22 | Disposition: A | Discharge status: Home / Self Care | Payer: Medicare Other | Source: Ambulatory Visit | Attending: Cardiovascular Disease | Admitting: Cardiovascular Disease

## 2019-04-22 DIAGNOSIS — I6521 Occlusion and stenosis of right carotid artery: Secondary | ICD-10-CM | POA: Diagnosis not present

## 2019-04-22 DIAGNOSIS — I6523 Occlusion and stenosis of bilateral carotid arteries: Secondary | ICD-10-CM

## 2019-04-23 ENCOUNTER — Ambulatory Visit (INDEPENDENT_AMBULATORY_CARE_PROVIDER_SITE_OTHER): Payer: Medicare Other | Admitting: Family Medicine

## 2019-04-23 ENCOUNTER — Encounter: Payer: Self-pay | Admitting: Family Medicine

## 2019-04-23 ENCOUNTER — Other Ambulatory Visit (HOSPITAL_COMMUNITY)
Admission: RE | Admit: 2019-04-23 | Discharge: 2019-04-23 | Disposition: A | Discharge status: Home / Self Care | Payer: Medicare Other | Source: Ambulatory Visit | Attending: Family Medicine | Admitting: Family Medicine

## 2019-04-23 VITALS — BP 126/64 | HR 81 | Temp 97.9°F | Ht <= 58 in | Wt 143.3 lb

## 2019-04-23 DIAGNOSIS — E538 Deficiency of other specified B group vitamins: Secondary | ICD-10-CM

## 2019-04-23 DIAGNOSIS — I255 Ischemic cardiomyopathy: Secondary | ICD-10-CM

## 2019-04-23 DIAGNOSIS — G40009 Localization-related (focal) (partial) idiopathic epilepsy and epileptic syndromes with seizures of localized onset, not intractable, without status epilepticus: Secondary | ICD-10-CM

## 2019-04-23 DIAGNOSIS — J449 Chronic obstructive pulmonary disease, unspecified: Secondary | ICD-10-CM | POA: Diagnosis not present

## 2019-04-23 DIAGNOSIS — Z8619 Personal history of other infectious and parasitic diseases: Secondary | ICD-10-CM

## 2019-04-23 DIAGNOSIS — I739 Peripheral vascular disease, unspecified: Secondary | ICD-10-CM | POA: Diagnosis not present

## 2019-04-23 DIAGNOSIS — E7800 Pure hypercholesterolemia, unspecified: Secondary | ICD-10-CM

## 2019-04-23 DIAGNOSIS — J410 Simple chronic bronchitis: Secondary | ICD-10-CM

## 2019-04-23 DIAGNOSIS — J9611 Chronic respiratory failure with hypoxia: Secondary | ICD-10-CM | POA: Diagnosis not present

## 2019-04-23 DIAGNOSIS — Z794 Long term (current) use of insulin: Secondary | ICD-10-CM

## 2019-04-23 DIAGNOSIS — E049 Nontoxic goiter, unspecified: Secondary | ICD-10-CM

## 2019-04-23 DIAGNOSIS — Z01419 Encounter for gynecological examination (general) (routine) without abnormal findings: Secondary | ICD-10-CM

## 2019-04-23 DIAGNOSIS — I6521 Occlusion and stenosis of right carotid artery: Secondary | ICD-10-CM

## 2019-04-23 DIAGNOSIS — D509 Iron deficiency anemia, unspecified: Secondary | ICD-10-CM

## 2019-04-23 DIAGNOSIS — I509 Heart failure, unspecified: Secondary | ICD-10-CM

## 2019-04-23 DIAGNOSIS — I10 Essential (primary) hypertension: Secondary | ICD-10-CM

## 2019-04-23 DIAGNOSIS — J4489 Other specified chronic obstructive pulmonary disease: Secondary | ICD-10-CM

## 2019-04-23 DIAGNOSIS — E1142 Type 2 diabetes mellitus with diabetic polyneuropathy: Secondary | ICD-10-CM | POA: Diagnosis not present

## 2019-04-23 DIAGNOSIS — G40209 Localization-related (focal) (partial) symptomatic epilepsy and epileptic syndromes with complex partial seizures, not intractable, without status epilepticus: Secondary | ICD-10-CM

## 2019-04-23 DIAGNOSIS — F17200 Nicotine dependence, unspecified, uncomplicated: Secondary | ICD-10-CM

## 2019-04-23 DIAGNOSIS — M8589 Other specified disorders of bone density and structure, multiple sites: Secondary | ICD-10-CM | POA: Diagnosis not present

## 2019-04-23 MED ORDER — CYANOCOBALAMIN 1000 MCG/ML IJ SOLN: 1000.0000 ug | Freq: Once | INTRAMUSCULAR | 0 refills | Status: DC

## 2019-04-23 MED ORDER — CYANOCOBALAMIN 1000 MCG/ML IJ SOLN
1000.0000 ug | Freq: Once | INTRAMUSCULAR | Status: AC
  Administered 2019-04-23: 1000 ug via INTRAMUSCULAR

## 2019-04-23 MED ORDER — NYSTATIN 100000 UNIT/GM EX POWD: Freq: Four times a day (QID) | CUTANEOUS | 3 refills | Status: DC

## 2019-04-23 MED ORDER — HYDROCHLOROTHIAZIDE 12.5 MG PO CAPS: 12.5000 mg | ORAL_CAPSULE | Freq: Every day | ORAL | 3 refills | Status: DC

## 2019-04-23 MED ORDER — DEXILANT 60 MG PO CPDR: DELAYED_RELEASE_CAPSULE | ORAL | 3 refills | Status: DC

## 2019-04-23 MED ORDER — POLYSACCHARIDE IRON COMPLEX 150 MG PO CAPS: 150.0000 mg | ORAL_CAPSULE | Freq: Two times a day (BID) | ORAL | 3 refills | Status: DC

## 2019-04-23 NOTE — Assessment & Plan Note (Signed)
Watched by endocrinology  Lab Results  Component Value Date   TSH 1.80 04/21/2019

## 2019-04-23 NOTE — Assessment & Plan Note (Signed)
Disc in detail risks of smoking and possible outcomes including copd, vascular/ heart disease, cancer , respiratory and sinus infections  Pt voices understanding Pt has cut to 1ppd Has patches/gum -nicotine to help quit Not ready to set quit date

## 2019-04-23 NOTE — Assessment & Plan Note (Signed)
Exam done with pap  H/o HPV-added HPV reflex No symptoms or abn on exam utd mammogram

## 2019-04-23 NOTE — Assessment & Plan Note (Signed)
Followed by cardiology/vascular  Had carotid doppler yesterday

## 2019-04-23 NOTE — Assessment & Plan Note (Signed)
Improving with iron  Lab Results  Component Value Date   HCT 35.9 (L) 04/21/2019   Lab Results  Component Value Date   FERRITIN 5.2 (L) 04/21/2019   Stores are still low  Will continue iron

## 2019-04-23 NOTE — Assessment & Plan Note (Signed)
On keppra  Followed by neuro

## 2019-04-23 NOTE — Assessment & Plan Note (Signed)
Surprised not worse given petite frame and  Heavy smoking hx Counseled to quit smoking dexa 10/19  Has fallen but no fx On C and CA Doing PT for exercise currently

## 2019-04-23 NOTE — Assessment & Plan Note (Signed)
Pt has home 02  In setting of copd Cut down smoking but has not quit  Continues pulmonary f/u

## 2019-04-23 NOTE — Patient Instructions (Addendum)
You need a pneumonia vaccine and flu shot in September (after sept 19th)  Please schedule your eye exam   If you are interested in shingles vaccine in future - call your insurance company to see how coverage is and call us to schedule   B12 shot today  Take 1000 mcg of vitamin B12 daily over the counter   For cholesterol Avoid red meat/ fried foods/ egg yolks/ fatty breakfast meats/ butter, cheese and high fat dairy/ and shellfish    Start back on hctz 12.5 mg  If BP gets too low-stop it

## 2019-04-23 NOTE — Assessment & Plan Note (Signed)
No recent seizures  Taking keppra  Sees neuro /Dr Loretta Plume yearly

## 2019-04-23 NOTE — Progress Notes (Signed)
Subjective:    Patient ID: Diane [name], female    DOB: [birth year], 63 y.o.   MRN: [id number]  HPI Here for annual f/u of chronic health problems   Doing PT twice weekly  Thinks it is helping   Had amw on 7/20 Due for eye exam- PPSV23 due in sept  She gets a flu vaccine yearly   Gyn exam /pap Past h/o HPV Declined pap in 2019  Wants a pap today  Has oophrectomy  Still has cervix and uterus    Mammogram 10/19  Self breast exam - no lumps or changes   Colonoscopy 10/18  GI is watching her  She still takes iron   Zoster status -is interested in shingrix   dexa 10/19  Osteopenia in [place] -has fallen this year  Fractures - wrist  Supplements - takes vit D and ca  Exercise - PT   Weight  Wt Readings from Last 3 Encounters:  04/23/19 143 lb 5 oz (65 kg)  04/20/19 141 lb (64 kg)  04/17/19 143 lb 6.4 oz (65 kg)   30.74 kg/m   Smoking status  Down to 1ppd  She plans to eventually quit  She has nitocine patches (has used the quit line) , also has nicorette gum  Original quit date was this mo-working on it   Chronic resp failure-on 02- so/so  Copd - worse in hot weather (uses albuterol 4 times daily)  Has smoker's cough -worse since fall    H/o vascular dz Carotid artery - stent RCA 1/19 (had carotid doppler done yesterday)  CHF PVD No new developments   bp is stable today  No cp or palpitations or headaches or edema  No side effects to medicines  BP Readings from Last 3 Encounters:  04/23/19 126/64  04/20/19 140/78  04/17/19 (!) 110/56    Off hctz Cut losartan  This was done by her cardiologist recently  She would like to start back on hctz at 1/4 of the dose  bp was elevated at cardiology office  At home 144/78   Lab Results  Component Value Date   CREATININE 0.80 04/21/2019   BUN 16 04/21/2019   NA 135 04/21/2019   K 5.1 04/21/2019   CL 101 04/21/2019   CO2 27 04/21/2019   Lab Results  Component Value Date   ALT 10 04/21/2019    AST 11 04/21/2019   ALKPHOS 66 04/21/2019   BILITOT 0.3 04/21/2019    H/o goiter watched by endocrinology Lab Results  Component Value Date   TSH 1.80 04/21/2019    DM2 Sees Dr Chalmers Cater Last A1C 6.7 in feb- very good  Has to call to set up her f/u   Seizure history -no more seizures  Taking keppra  Sees Dr Loretta Plume in a year    B12 def Lab Results  Component Value Date   VITAMINB12 257 04/21/2019     Lab Results  Component Value Date   WBC 5.2 04/21/2019   HGB 11.3 (L) 04/21/2019   HCT 35.9 (L) 04/21/2019   MCV 75.0 (L) 04/21/2019   PLT 242.0 04/21/2019  Hb is up from 9.9   Lab Results  Component Value Date   FERRITIN 5.2 (L) 04/21/2019  this is down from 15.1  Still taking iron   Hyperlipidemia Lab Results  Component Value Date   CHOL 139 04/21/2019   CHOL 146 04/14/2018   CHOL 153 04/11/2017   Lab Results  Component Value Date  HDL 45.10 04/21/2019   HDL 44.90 04/14/2018   HDL 47.30 04/11/2017   Lab Results  Component Value Date   LDLCALC 61 04/21/2019   LDLCALC 71 04/14/2018   LDLCALC 77 08/13/2016   Lab Results  Component Value Date   TRIG 163.0 (H) 04/21/2019   TRIG 149.0 04/14/2018   TRIG 231.0 (H) 04/11/2017   Lab Results  Component Value Date   CHOLHDL 3 04/21/2019   CHOLHDL 3 04/14/2018   CHOLHDL 3 04/11/2017   Lab Results  Component Value Date   LDLDIRECT 76.0 04/11/2017   LDLDIRECT 98.0 06/09/2013   LDLDIRECT 53.6 12/12/2012   Statin and diet   Eats red meat twice a month   Patient Active Problem List   Diagnosis Date Noted  . Visit for routine gyn exam 04/23/2019  . Generalized weakness 03/24/2019  . Occult blood in stools   . Positive colorectal cancer screening using DNA-based stool test 12/22/2018  . Anemia 12/22/2018  . Fatigue 12/05/2018  . Estrogen deficiency 04/21/2018  . Screening mammogram, encounter for 04/21/2018  . Osteopenia 04/21/2018  . History of HPV infection 04/21/2018  . Dizzy spells 03/03/2018   . Ischemic cardiomyopathy 01/21/2018  . Other constipation 05/07/2017  . Colon cancer screening 04/19/2017  . Incisional hernia 01/14/2017  . Smokers' cough ([place]) 08/13/2016  . Chronic respiratory failure ([place]) 07/11/2016  . Sleep-related hypoventilation due to lower airway obstruction 05/20/2016  . Periodic limb movements of sleep 05/20/2016  . CHF (congestive heart failure) ([place]) 05/08/2016  . Type 2 diabetes mellitus with diabetic polyneuropathy, with long-term current use of insulin ([place]) 12/13/2015  . B12 deficiency 12/01/2015  . History of CVA (cerebrovascular accident) 11/29/2015  . Abnormal nuclear stress test 05/06/2015  . 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 of localized onset, not intractable, without status epilepticus ([place]) 03/22/2015  . Cervical disc disorder with radiculopathy of cervical region 03/22/2015  . Complex partial seizures ([place]) 09/16/2013  . Encounter for Medicare annual wellness exam 06/16/2013  . Antiplatelet or antithrombotic long-term use 06/08/2013  . Seizures ([place]) 03/25/2013  . Goiter 09/01/2012  . Other screening mammogram 06/13/2012  . Allergic rhinitis 06/13/2012  . Carotid artery disease ([place]) 08/20/2011  . Obstructive sleep apnea 09/18/2007  . TOBACCO USE 08/08/2007  . Hyperlipidemia 03/26/2007  . ANXIETY 03/26/2007  . DEPRESSION 03/26/2007  . MIGRAINE HEADACHE 03/26/2007  . CARPAL TUNNEL SYNDROME, BILATERAL 03/26/2007  . Essential hypertension 03/26/2007  . Hx of CABG 03/26/2007  . GERD 03/26/2007  . Claudication in peripheral vascular disease ([place]) 03/26/2007   Past Medical History:  Diagnosis Date  . Anemia   . Carotid stenosis    a. s/p right carotid stent 10/2017.  [name] Chronic systolic CHF (congestive heart failure) ([place])   . Colon polyps   . COPD (chronic obstructive pulmonary disease) ([place])   . Coronary artery disease    post CABG in 3/07 , coronary stents   . Diabetes mellitus    type  2  . Dyslipidemia   . Fibromyalgia   . GERD (gastroesophageal reflux disease)   . Headache    hx of   . Hyperlipidemia   . Hypertension   . Myocardial infarction ([place])   . Pneumonia    hx of   . Seizures ([place])    last seizure- 03/2013   . Shortness of breath dyspnea    with exertion or when fluid builds up   . Sleep apnea    used to wear a cpap- not  used in 3 years   . Status post dilation of esophageal narrowing   . Stroke [facility]) 1993   problems with balance   . Systolic murmur    known mild AS and MR  . Thyroid goiter   . Tobacco abuse    Past Surgical History:  Procedure Laterality Date  . APPENDECTOMY    . BIOPSY  02/03/2019   Procedure: BIOPSY;  Surgeon: Ladene Artist, MD;  Location: [facility];  Service: Endoscopy;;  . BREAST BIOPSY Left 2016  . CARDIAC CATHETERIZATION  11/29/05   EF of 55%  . CARDIAC CATHETERIZATION  08/06/06   EF of 45-50%  . CARDIAC CATHETERIZATION N/A 05/06/2015   Procedure: Right/Left Heart Cath and Coronary/Graft Angiography;  Surgeon: Peter M Martinique, MD;  Location: [place] [facility];  Service: Cardiovascular;  Laterality: N/A;  . CAROTID PTA/STENT INTERVENTION N/A 10/10/2017   Procedure: CAROTID PTA/STENT INTERVENTION - Right;  Surgeon: Serafina Mitchell, MD;  Location: [place] [facility] LAB;  Service: Cardiovascular;  Laterality: N/A;  . CERVICAL FUSION  1990  . CHOLECYSTECTOMY    . COLON RESECTION     mass removed and 4 in of colon  . COLONOSCOPY WITH PROPOFOL N/A 07/16/2017   Procedure: COLONOSCOPY WITH PROPOFOL;  Surgeon: Ladene Artist, MD;  [facility]: [facility];  Service: Endoscopy;  Laterality: N/A;  . CORONARY ARTERY BYPASS GRAFT  12/04/2005   x5 -- left internal mammary artery to the LAD, left radial artery to the ramus intermedius, saphenous vein graft to the obtuse marginal 1, sequential saphenous vein grat to the acute marginal and posterior descending, endoscopic vein harvesting from the left thigh with open vein harvest from right  leg  . CORONARY STENT PLACEMENT  08/11/06   PCI of her ciurcumflex/OM vessel  . ESOPHAGOGASTRODUODENOSCOPY (EGD) WITH PROPOFOL N/A 02/03/2019   Procedure: ESOPHAGOGASTRODUODENOSCOPY (EGD) WITH PROPOFOL;  Surgeon: Ladene Artist, MD;  Location: [facility];  Service: Endoscopy;  Laterality: N/A;  . LAPAROTOMY Bilateral 05/19/2015   Procedure: EXPLORATORY LAPAROTOMY WITH BILATERAL SALPINGO OOPHORECTOMY /OMENTECTOMY/SEGMENTAL SIGMOID COLECTOMY ;  Surgeon: Everitt Amber, MD;  Location: [facility];  Service: Gynecology;  Laterality: Bilateral;  . PERIPHERAL VASCULAR CATHETERIZATION N/A 11/15/2015   Procedure: Carotid PTA/Stent Intervention;  Surgeon: Lorretta Harp, MD;  Location: [place] [facility];  Service: Cardiovascular;  Laterality: N/A;  . PERIPHERAL VASCULAR CATHETERIZATION  11/15/2015   Procedure: Carotid Angiography;  Surgeon: Lorretta Harp, MD;  Location: [place] [facility];  Service: Cardiovascular;;   Social History   Tobacco Use  . Smoking status: Current Every Day Smoker    Packs/day: 1.00    Years: 51.00    Pack years: 51.00    Types: Cigarettes    Start date: 62  . Smokeless tobacco: Never Used  . Tobacco comment: 04/17/19 down to 1 pack her day--LCR  1.5 pack to 2 packs per day. Has been working on reducing the # of cigarettes she smokes, down to 1/2 PP7D  Substance Use Topics  . Alcohol use: No  . Drug use: No   Family History  Problem Relation Age of Onset  . Heart disease Mother   . Diabetes Mother   . COPD Mother   . Hyperlipidemia Mother   . Hypertension Mother   . Cancer Father        met, origin unknown  . Heart attack Father   . Drug abuse Paternal Grandmother   . Stroke Paternal Grandfather   . Lung cancer Paternal Aunt  lung with mets to brain  . Melanoma Paternal Uncle   . Lung cancer Paternal Uncle        lung/liver to brain  . Cancer Paternal Uncle        cancer of unknown type  . Heart disease Maternal Grandfather   . Hypertension Sister    . Cancer Sister        eyelid  . Glaucoma Sister   . Heart disease Maternal Aunt        x 2 aunts   Allergies  Allergen Reactions  . Bee Venom Itching and Swelling  . Entresto [Sacubitril-Valsartan] Cough  . Tetracycline     Unknown   . Ace Inhibitors Cough   Current Outpatient Medications on File Prior to Visit  Medication Sig Dispense Refill  . acetaminophen (TYLENOL) 500 MG tablet Take 1,000 mg by mouth every 6 (six) hours as needed for moderate pain or headache.    . albuterol (VENTOLIN HFA) 108 (90 Base) MCG/ACT inhaler INHALER 2 PUFFS EVERY 6 HOURS AS NEEDED FOR WHEEZING/SHORTNESS OF BREATH 1 Inhaler 6  . aspirin EC 81 MG tablet Take 81 mg by mouth every evening.  30 tablet 6  . carvedilol (COREG) 12.5 MG tablet Take 1/2 tablet ( 6.25 mg ) twice a day 60 tablet 6  . cetirizine (ZYRTEC) 10 MG tablet Take 10 mg by mouth daily as needed for allergies.    [name] clopidogrel (PLAVIX) 75 MG tablet TAKE 1 TABLET BY MOUTH EVERY DAY 90 tablet 2  . diphenhydrAMINE (BENADRYL) 25 mg capsule Take 2 capsules in AM and 1 capsule at night (Patient taking differently: Take 50 mg by mouth 2 (two) times daily as needed for itching or allergies. ) 270 capsule 3  . fluticasone (FLONASE) 50 MCG/ACT nasal spray Place 2 sprays into both nostrils daily. 16 g 11  . furosemide (LASIX) 40 MG tablet TAKE 1 TABLET BY MOUTH EVERY DAY 90 tablet 3  . gabapentin (NEURONTIN) 300 MG capsule TAKE 2 CAPSULES BY MOUTH EVERY DAY AT BEDTIME (Patient taking differently: Take 600 mg by mouth at bedtime. ) 180 capsule 1  . glimepiride (AMARYL) 2 MG tablet Take 4 mg by mouth 2 (two) times daily.   6  . guaiFENesin (MUCINEX) 600 MG 12 hr tablet Take 2 tablets (1,200 mg total) by mouth 2 (two) times daily. 360 tablet 3  . isosorbide mononitrate (IMDUR) 30 MG 24 hr tablet TAKE 1 TABLET BY MOUTH EVERY DAY 90 tablet 2  . KLOR-CON M10 10 MEQ tablet TAKE 1 TABLET BY MOUTH TWICE A DAY 180 tablet 3  . levETIRAcetam (KEPPRA) 1000 MG  tablet TAKE 1 TABLET BY MOUTH TWICE A DAY 180 tablet 3  . losartan (COZAAR) 50 MG tablet Take 1 tablet (50 mg total) by mouth daily. (Patient taking differently: Take 25 mg by mouth daily. ) 90 tablet 3  . metFORMIN (GLUCOPHAGE-XR) 500 MG 24 hr tablet Take 500 mg by mouth 2 (two) times daily.  6  . nicotine polacrilex (NICORETTE) 4 MG gum Take 1 each (4 mg total) by mouth as needed for smoking cessation. 90 tablet 2  . nitroGLYCERIN (NITROLINGUAL) 0.4 MG/SPRAY spray PLACE 1 SPRAY UNDER THE TONGUE EVERY 5 (FIVE) MINUTES X 3 DOSES AS NEEDED FOR CHEST PAIN. 12 g 3  . ONE TOUCH ULTRA TEST test strip USE AS DIRECTED FOR TESTING BLOOD GLUCOSE 3 TIMES DAILY  1  . polyethylene glycol powder (GLYCOLAX/MIRALAX) powder TAKE 1 DOSE 17 GRAMS IN 8 OZ  OF WATER DAILY AS NEEDED FOR CONSTIPATION. (Patient taking differently: Take 17 g by mouth daily. Take 1 dose 17 grams in 8 oz of water daily for constipation.) 527 g 3  . ranolazine (RANEXA) 1000 MG SR tablet Take 1 tablet (1,000 mg total) by mouth 2 (two) times daily. 180 tablet 3  . rosuvastatin (CRESTOR) 10 MG tablet TAKE 1 TABLET BY MOUTH EVERY DAY 90 tablet 2  . sodium chloride (OCEAN) 0.65 % SOLN nasal spray Place 1 spray into both nostrils as needed for congestion.    . hydrochlorothiazide (HYDRODIURIL) 50 MG tablet TAKE 1 TABLET BY MOUTH EVERY DAY (Patient not taking: Reported on 04/23/2019) 90 tablet 3   No current facility-administered medications on file prior to visit.      Review of Systems  Constitutional: Positive for fatigue. Negative for activity change, appetite change, fever and unexpected weight change.  HENT: Negative for congestion, ear pain, rhinorrhea, sinus pressure and sore throat.   Eyes: Negative for pain, redness and visual disturbance.  Respiratory: Positive for cough and wheezing. Negative for shortness of breath.        Baseline sob on exertion   Cardiovascular: Negative for chest pain and palpitations.  Gastrointestinal: Negative  for abdominal pain, blood in stool, constipation and diarrhea.  Endocrine: Negative for polydipsia and polyuria.  Genitourinary: Negative for dysuria, frequency and urgency.  Musculoskeletal: Negative for arthralgias, back pain and myalgias.  Skin: Negative for pallor and rash.  Allergic/Immunologic: Negative for environmental allergies.  Neurological: Positive for dizziness. Negative for syncope and headaches.       Poor balance Doing PT   Hematological: Negative for adenopathy. Does not bruise/bleed easily.  Psychiatric/Behavioral: Negative for decreased concentration and dysphoric mood. The patient is not nervous/anxious.        Objective:   Physical Exam Constitutional:      General: She is not in acute distress.    Appearance: Normal appearance. She is well-developed. She is obese. She is not ill-appearing or diaphoretic.  HENT:     Head: Normocephalic and atraumatic.     Right Ear: Tympanic membrane, ear canal and external ear normal.     Left Ear: Tympanic membrane, ear canal and external ear normal.     Nose: Nose normal.     Mouth/Throat:     Mouth: Mucous membranes are moist.     Pharynx: Oropharynx is clear. No posterior oropharyngeal erythema.  Eyes:     General: No scleral icterus.    Conjunctiva/sclera: Conjunctivae normal.     Pupils: Pupils are equal, round, and reactive to light.  Neck:     Musculoskeletal: Normal range of motion and neck supple. No neck rigidity.     Thyroid: No thyromegaly.     Vascular: No carotid bruit or JVD.  Cardiovascular:     Rate and Rhythm: Normal rate and regular rhythm.     Pulses: Normal pulses.     Heart sounds: Murmur present. No gallop.   Pulmonary:     Effort: Pulmonary effort is normal. No respiratory distress.     Breath sounds: Normal breath sounds. No wheezing.  Chest:     Chest wall: No tenderness.  Abdominal:     General: Bowel sounds are normal. There is no distension or abdominal bruit.     Palpations: Abdomen  is soft. There is no mass.     Tenderness: There is no abdominal tenderness. There is no right CVA tenderness or left CVA tenderness.  Musculoskeletal: Normal range  of motion.        General: No tenderness.     Right lower leg: No edema.     Left lower leg: No edema.  Lymphadenopathy:     Cervical: No cervical adenopathy.  Skin:    General: Skin is warm and dry.     Coloration: Skin is not pale.     Findings: No erythema or rash.     Comments: Solar lentigines diffusely Very tanned  Neurological:     Mental Status: She is alert. Mental status is at baseline.     Cranial Nerves: No cranial nerve deficit.     Motor: No abnormal muscle tone.     Coordination: Coordination normal.     Gait: Gait normal.     Deep Tendon Reflexes: Reflexes are normal and symmetric. Reflexes normal.  Psychiatric:        Mood and Affect: Mood normal.        Cognition and Memory: Cognition and memory normal.           Assessment & Plan:   Problem List Items Addressed This Visit      Cardiovascular and Mediastinum   Carotid artery disease (HCC) (Chronic)    Followed by cardiology/vascular  Had carotid doppler yesterday      Relevant Medications   hydrochlorothiazide (MICROZIDE) 12.5 MG capsule   Essential hypertension - Primary    bp in fair control at this time  BP Readings from Last 1 Encounters:  04/23/19 126/64   Pt notes however it is higher at home and requests px for hctz 12.5 mg daily -will monitor  Most recent labs reviewed  Disc lifstyle change with low sodium diet and exercise        Relevant Medications   hydrochlorothiazide (MICROZIDE) 12.5 MG capsule   CHF (congestive heart failure) (HCC)    No clinical changes      Relevant Medications   hydrochlorothiazide (MICROZIDE) 12.5 MG capsule     Respiratory   Chronic respiratory failure (HCC)    Pt has home 02  In setting of copd Cut down smoking but has not quit  Continues pulmonary f/u      Smokers' cough ([place])     Ongoing  Counseled on smoking cessation       RESOLVED: COPD with chronic bronchitis ([place])     Endocrine   Goiter    Watched by endocrinology  Lab Results  Component Value Date   TSH 1.80 04/21/2019         Type 2 diabetes mellitus with diabetic polyneuropathy, with long-term current use of insulin (HCC)    Lab Results  Component Value Date   HGBA1C 6.7 11/03/2018   Sees Dr Chalmers Cater -that was done in feb  No medication changes        Nervous and Auditory   Complex partial seizures ([place])    No recent seizures  Taking keppra  Sees neuro /Dr Loretta Plume yearly      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 of localized onset, not intractable, without status epilepticus ([place])    On keppra  Followed by neuro        Musculoskeletal and Integument   Osteopenia    Surprised not worse given petite frame and  Heavy smoking hx Counseled to quit smoking dexa 10/19  Has fallen but no fx On C and CA Doing PT for exercise currently        Other   Hyperlipidemia    Controlled  with statin and diet Disc goals for lipids and reasons to control them Rev last labs with pt Rev low sat fat diet in detail       Relevant Medications   hydrochlorothiazide (MICROZIDE) 12.5 MG capsule   TOBACCO USE    Disc in detail risks of smoking and possible outcomes including copd, vascular/ heart disease, cancer , respiratory and sinus infections  Pt voices understanding Pt has cut to 1ppd Has patches/gum -nicotine to help quit Not ready to set quit date       Claudication in peripheral vascular disease ([place])    Stable Followed by vascular      B12 deficiency    Lab Results  Component Value Date   [id number] 04/21/2019   No current supplementation  Will give B12 shot today  inst to take 1000 mcg vit B12 otc daily      History of HPV infection    Pap done today      Anemia    Improving with iron  Lab Results  Component Value Date    HCT 35.9 (L) 04/21/2019   Lab Results  Component Value Date   FERRITIN 5.2 (L) 04/21/2019   Stores are still low  Will continue iron      Relevant Medications   iron polysaccharides (NU-IRON) 150 MG capsule   Visit for routine gyn exam    Exam done with pap  H/o HPV-added HPV reflex No symptoms or abn on exam utd mammogram       Relevant Orders   Cytology - PAP([place])

## 2019-04-23 NOTE — Assessment & Plan Note (Signed)
Lab Results  Component Value Date   HGBA1C 6.7 11/03/2018   Sees Dr Chalmers Cater -that was done in feb  No medication changes

## 2019-04-23 NOTE — Assessment & Plan Note (Signed)
bp in fair control at this time  BP Readings from Last 1 Encounters:  04/23/19 126/64   Pt notes however it is higher at home and requests px for hctz 12.5 mg daily -will monitor  Most recent labs reviewed  Disc lifstyle change with low sodium diet and exercise

## 2019-04-23 NOTE — Assessment & Plan Note (Signed)
Controlled with statin and diet Disc goals for lipids and reasons to control them Rev last labs with pt Rev low sat fat diet in detail

## 2019-04-23 NOTE — Assessment & Plan Note (Signed)
No clinical changes 

## 2019-04-23 NOTE — Assessment & Plan Note (Signed)
Lab Results  Component Value Date   [id number] 226 04/21/2019   No current supplementation  Will give B12 shot today  inst to take 1000 mcg vit B12 otc daily

## 2019-04-23 NOTE — Assessment & Plan Note (Signed)
Stable Followed by vascular

## 2019-04-23 NOTE — Assessment & Plan Note (Signed)
Pap done today  

## 2019-04-23 NOTE — Assessment & Plan Note (Signed)
Ongoing  Counseled on smoking cessation

## 2019-04-25 LAB — CYTOLOGY - PAP
Adequacy: ABSENT
Diagnosis: NEGATIVE
HPV 16/18/45 genotyping: NEGATIVE
HPV: DETECTED — AB

## 2019-04-26 ENCOUNTER — Other Ambulatory Visit: Payer: Self-pay | Admitting: Family Medicine

## 2019-04-26 ENCOUNTER — Other Ambulatory Visit: Payer: Self-pay | Admitting: Cardiovascular Disease

## 2019-04-26 DIAGNOSIS — Z7951 Long term (current) use of inhaled steroids: Secondary | ICD-10-CM | POA: Diagnosis not present

## 2019-04-26 DIAGNOSIS — Z951 Presence of aortocoronary bypass graft: Secondary | ICD-10-CM | POA: Diagnosis not present

## 2019-04-26 DIAGNOSIS — J449 Chronic obstructive pulmonary disease, unspecified: Secondary | ICD-10-CM | POA: Diagnosis not present

## 2019-04-26 DIAGNOSIS — G40009 Localization-related (focal) (partial) idiopathic epilepsy and epileptic syndromes with seizures of localized onset, not intractable, without status epilepticus: Secondary | ICD-10-CM | POA: Diagnosis not present

## 2019-04-26 DIAGNOSIS — I110 Hypertensive heart disease with heart failure: Secondary | ICD-10-CM | POA: Diagnosis not present

## 2019-04-26 DIAGNOSIS — M797 Fibromyalgia: Secondary | ICD-10-CM | POA: Diagnosis not present

## 2019-04-26 DIAGNOSIS — F17210 Nicotine dependence, cigarettes, uncomplicated: Secondary | ICD-10-CM | POA: Diagnosis not present

## 2019-04-26 DIAGNOSIS — I255 Ischemic cardiomyopathy: Secondary | ICD-10-CM | POA: Diagnosis not present

## 2019-04-26 DIAGNOSIS — I5042 Chronic combined systolic (congestive) and diastolic (congestive) heart failure: Secondary | ICD-10-CM | POA: Diagnosis not present

## 2019-04-26 DIAGNOSIS — Z8673 Personal history of transient ischemic attack (TIA), and cerebral infarction without residual deficits: Secondary | ICD-10-CM | POA: Diagnosis not present

## 2019-04-26 DIAGNOSIS — E1142 Type 2 diabetes mellitus with diabetic polyneuropathy: Secondary | ICD-10-CM | POA: Diagnosis not present

## 2019-04-26 DIAGNOSIS — J9620 Acute and chronic respiratory failure, unspecified whether with hypoxia or hypercapnia: Secondary | ICD-10-CM | POA: Diagnosis not present

## 2019-04-26 DIAGNOSIS — Z7982 Long term (current) use of aspirin: Secondary | ICD-10-CM | POA: Diagnosis not present

## 2019-04-26 DIAGNOSIS — Z7984 Long term (current) use of oral hypoglycemic drugs: Secondary | ICD-10-CM | POA: Diagnosis not present

## 2019-04-26 DIAGNOSIS — I252 Old myocardial infarction: Secondary | ICD-10-CM | POA: Diagnosis not present

## 2019-04-26 DIAGNOSIS — I2510 Atherosclerotic heart disease of native coronary artery without angina pectoris: Secondary | ICD-10-CM | POA: Diagnosis not present

## 2019-04-27 ENCOUNTER — Encounter: Payer: Self-pay | Admitting: Family Medicine

## 2019-04-27 DIAGNOSIS — J449 Chronic obstructive pulmonary disease, unspecified: Secondary | ICD-10-CM | POA: Diagnosis not present

## 2019-04-27 DIAGNOSIS — I110 Hypertensive heart disease with heart failure: Secondary | ICD-10-CM | POA: Diagnosis not present

## 2019-04-27 DIAGNOSIS — I5042 Chronic combined systolic (congestive) and diastolic (congestive) heart failure: Secondary | ICD-10-CM | POA: Diagnosis not present

## 2019-04-27 DIAGNOSIS — J9620 Acute and chronic respiratory failure, unspecified whether with hypoxia or hypercapnia: Secondary | ICD-10-CM | POA: Diagnosis not present

## 2019-04-27 DIAGNOSIS — I255 Ischemic cardiomyopathy: Secondary | ICD-10-CM | POA: Diagnosis not present

## 2019-04-27 DIAGNOSIS — I2510 Atherosclerotic heart disease of native coronary artery without angina pectoris: Secondary | ICD-10-CM | POA: Diagnosis not present

## 2019-04-30 ENCOUNTER — Telehealth: Payer: Self-pay

## 2019-04-30 NOTE — Telephone Encounter (Signed)
Please ok that verbal order  

## 2019-04-30 NOTE — Telephone Encounter (Signed)
Cecilie Lowers PT with [facility] calling to increase to 2 x a week 3 weeks. She is responding well. Please call with orders to [phone]. Okay to leave message.

## 2019-05-01 ENCOUNTER — Telehealth: Payer: Self-pay | Admitting: Pulmonary Disease

## 2019-05-01 DIAGNOSIS — I5042 Chronic combined systolic (congestive) and diastolic (congestive) heart failure: Secondary | ICD-10-CM | POA: Diagnosis not present

## 2019-05-01 DIAGNOSIS — I2510 Atherosclerotic heart disease of native coronary artery without angina pectoris: Secondary | ICD-10-CM | POA: Diagnosis not present

## 2019-05-01 DIAGNOSIS — J9620 Acute and chronic respiratory failure, unspecified whether with hypoxia or hypercapnia: Secondary | ICD-10-CM | POA: Diagnosis not present

## 2019-05-01 DIAGNOSIS — I110 Hypertensive heart disease with heart failure: Secondary | ICD-10-CM | POA: Diagnosis not present

## 2019-05-01 DIAGNOSIS — I255 Ischemic cardiomyopathy: Secondary | ICD-10-CM | POA: Diagnosis not present

## 2019-05-01 DIAGNOSIS — J449 Chronic obstructive pulmonary disease, unspecified: Secondary | ICD-10-CM | POA: Diagnosis not present

## 2019-05-01 NOTE — Telephone Encounter (Signed)
Verbal order given to [facility]

## 2019-05-01 NOTE — Telephone Encounter (Signed)
Order was sent to [facility] on 7/17 and Angela sent confirmation back that order was received.  Called [facility] & spoke to [place].  She said she sent order to Respiratory Dept but she thinks they are backed up.  She is going to send them a message and have them call pt today.  I called pt and told her I called [facility], they do have order & someone should call her today.  I told her to call them Monday if she doesn't her from them today.  It's already 4:45.  Nothing further needed.

## 2019-05-01 NOTE — Telephone Encounter (Signed)
I will call [facility] to check on ono.

## 2019-05-01 NOTE — Telephone Encounter (Signed)
Chronic respiratory failure with hypoxia. - hasn't been using nocturnal oxygen - will arrange for ONO with room air and then determine if she needs to resume supplemental oxygen at night  Order for ono on room air was placed 04/17/2019 at pt's last OV with [facility].  [facility], is there any way you can help [place] out with this as pt has not heard anything about having the ONO performed.

## 2019-05-04 DIAGNOSIS — J449 Chronic obstructive pulmonary disease, unspecified: Secondary | ICD-10-CM | POA: Diagnosis not present

## 2019-05-04 DIAGNOSIS — I2510 Atherosclerotic heart disease of native coronary artery without angina pectoris: Secondary | ICD-10-CM | POA: Diagnosis not present

## 2019-05-04 DIAGNOSIS — I255 Ischemic cardiomyopathy: Secondary | ICD-10-CM | POA: Diagnosis not present

## 2019-05-04 DIAGNOSIS — I110 Hypertensive heart disease with heart failure: Secondary | ICD-10-CM | POA: Diagnosis not present

## 2019-05-04 DIAGNOSIS — J9620 Acute and chronic respiratory failure, unspecified whether with hypoxia or hypercapnia: Secondary | ICD-10-CM | POA: Diagnosis not present

## 2019-05-04 DIAGNOSIS — I5042 Chronic combined systolic (congestive) and diastolic (congestive) heart failure: Secondary | ICD-10-CM | POA: Diagnosis not present

## 2019-05-06 DIAGNOSIS — I2510 Atherosclerotic heart disease of native coronary artery without angina pectoris: Secondary | ICD-10-CM | POA: Diagnosis not present

## 2019-05-06 DIAGNOSIS — J9620 Acute and chronic respiratory failure, unspecified whether with hypoxia or hypercapnia: Secondary | ICD-10-CM | POA: Diagnosis not present

## 2019-05-06 DIAGNOSIS — I110 Hypertensive heart disease with heart failure: Secondary | ICD-10-CM | POA: Diagnosis not present

## 2019-05-06 DIAGNOSIS — J449 Chronic obstructive pulmonary disease, unspecified: Secondary | ICD-10-CM | POA: Diagnosis not present

## 2019-05-06 DIAGNOSIS — I255 Ischemic cardiomyopathy: Secondary | ICD-10-CM | POA: Diagnosis not present

## 2019-05-06 DIAGNOSIS — I5042 Chronic combined systolic (congestive) and diastolic (congestive) heart failure: Secondary | ICD-10-CM | POA: Diagnosis not present

## 2019-05-08 DIAGNOSIS — I5042 Chronic combined systolic (congestive) and diastolic (congestive) heart failure: Secondary | ICD-10-CM | POA: Diagnosis not present

## 2019-05-08 DIAGNOSIS — I2510 Atherosclerotic heart disease of native coronary artery without angina pectoris: Secondary | ICD-10-CM | POA: Diagnosis not present

## 2019-05-08 DIAGNOSIS — Z7982 Long term (current) use of aspirin: Secondary | ICD-10-CM

## 2019-05-08 DIAGNOSIS — E1142 Type 2 diabetes mellitus with diabetic polyneuropathy: Secondary | ICD-10-CM

## 2019-05-08 DIAGNOSIS — I110 Hypertensive heart disease with heart failure: Secondary | ICD-10-CM | POA: Diagnosis not present

## 2019-05-08 DIAGNOSIS — I255 Ischemic cardiomyopathy: Secondary | ICD-10-CM | POA: Diagnosis not present

## 2019-05-08 DIAGNOSIS — G40009 Localization-related (focal) (partial) idiopathic epilepsy and epileptic syndromes with seizures of localized onset, not intractable, without status epilepticus: Secondary | ICD-10-CM | POA: Diagnosis not present

## 2019-05-08 DIAGNOSIS — I252 Old myocardial infarction: Secondary | ICD-10-CM | POA: Diagnosis not present

## 2019-05-08 DIAGNOSIS — J449 Chronic obstructive pulmonary disease, unspecified: Secondary | ICD-10-CM | POA: Diagnosis not present

## 2019-05-08 DIAGNOSIS — F17210 Nicotine dependence, cigarettes, uncomplicated: Secondary | ICD-10-CM | POA: Diagnosis not present

## 2019-05-08 DIAGNOSIS — Z8673 Personal history of transient ischemic attack (TIA), and cerebral infarction without residual deficits: Secondary | ICD-10-CM

## 2019-05-08 DIAGNOSIS — M797 Fibromyalgia: Secondary | ICD-10-CM

## 2019-05-08 DIAGNOSIS — Z951 Presence of aortocoronary bypass graft: Secondary | ICD-10-CM | POA: Diagnosis not present

## 2019-05-08 DIAGNOSIS — Z7984 Long term (current) use of oral hypoglycemic drugs: Secondary | ICD-10-CM

## 2019-05-08 DIAGNOSIS — Z7952 Long term (current) use of systemic steroids: Secondary | ICD-10-CM

## 2019-05-08 DIAGNOSIS — J9620 Acute and chronic respiratory failure, unspecified whether with hypoxia or hypercapnia: Secondary | ICD-10-CM | POA: Diagnosis not present

## 2019-05-12 DIAGNOSIS — R0902 Hypoxemia: Secondary | ICD-10-CM | POA: Diagnosis not present

## 2019-05-12 DIAGNOSIS — J449 Chronic obstructive pulmonary disease, unspecified: Secondary | ICD-10-CM | POA: Diagnosis not present

## 2019-05-13 DIAGNOSIS — J449 Chronic obstructive pulmonary disease, unspecified: Secondary | ICD-10-CM | POA: Diagnosis not present

## 2019-05-13 DIAGNOSIS — I5042 Chronic combined systolic (congestive) and diastolic (congestive) heart failure: Secondary | ICD-10-CM | POA: Diagnosis not present

## 2019-05-13 DIAGNOSIS — I2510 Atherosclerotic heart disease of native coronary artery without angina pectoris: Secondary | ICD-10-CM | POA: Diagnosis not present

## 2019-05-13 DIAGNOSIS — I255 Ischemic cardiomyopathy: Secondary | ICD-10-CM | POA: Diagnosis not present

## 2019-05-13 DIAGNOSIS — I110 Hypertensive heart disease with heart failure: Secondary | ICD-10-CM | POA: Diagnosis not present

## 2019-05-13 DIAGNOSIS — J9620 Acute and chronic respiratory failure, unspecified whether with hypoxia or hypercapnia: Secondary | ICD-10-CM | POA: Diagnosis not present

## 2019-05-14 ENCOUNTER — Telehealth: Payer: Self-pay | Admitting: Pulmonary Disease

## 2019-05-14 NOTE — Telephone Encounter (Signed)
Spoke with pt and notified of results per Dr. Sood. Pt verbalized understanding and denied any questions.  

## 2019-05-14 NOTE — Telephone Encounter (Signed)
ONO with RA 05/12/19 >> test time 8 hrs 54 min.  Baseline SpO2 90%, low SpO2 78%.  Spent 2 hrs 50 min with SpO2 < 88%.   Please let her know her oxygen is still low at night.  She needs to continue using supplemental oxygen at night.

## 2019-05-15 ENCOUNTER — Other Ambulatory Visit: Payer: Self-pay

## 2019-05-15 DIAGNOSIS — I255 Ischemic cardiomyopathy: Secondary | ICD-10-CM | POA: Diagnosis not present

## 2019-05-15 DIAGNOSIS — I2510 Atherosclerotic heart disease of native coronary artery without angina pectoris: Secondary | ICD-10-CM | POA: Diagnosis not present

## 2019-05-15 DIAGNOSIS — J9620 Acute and chronic respiratory failure, unspecified whether with hypoxia or hypercapnia: Secondary | ICD-10-CM | POA: Diagnosis not present

## 2019-05-15 DIAGNOSIS — J449 Chronic obstructive pulmonary disease, unspecified: Secondary | ICD-10-CM | POA: Diagnosis not present

## 2019-05-15 DIAGNOSIS — I110 Hypertensive heart disease with heart failure: Secondary | ICD-10-CM | POA: Diagnosis not present

## 2019-05-15 DIAGNOSIS — I5042 Chronic combined systolic (congestive) and diastolic (congestive) heart failure: Secondary | ICD-10-CM | POA: Diagnosis not present

## 2019-05-15 NOTE — Telephone Encounter (Signed)
Spoke to pt. Advised her to [facility] to find out where her refills are. We sent the rxs in and it shows they were received at the pharmacy.

## 2019-05-15 NOTE — Telephone Encounter (Signed)
Pt left v/m; pt seen on 04/23/19 and nystatin and HCTZ sent electronically to [facility] pt has not received med and request cb with status of refills.

## 2019-05-19 DIAGNOSIS — J9620 Acute and chronic respiratory failure, unspecified whether with hypoxia or hypercapnia: Secondary | ICD-10-CM | POA: Diagnosis not present

## 2019-05-19 DIAGNOSIS — I5042 Chronic combined systolic (congestive) and diastolic (congestive) heart failure: Secondary | ICD-10-CM | POA: Diagnosis not present

## 2019-05-19 DIAGNOSIS — J449 Chronic obstructive pulmonary disease, unspecified: Secondary | ICD-10-CM | POA: Diagnosis not present

## 2019-05-19 DIAGNOSIS — I255 Ischemic cardiomyopathy: Secondary | ICD-10-CM | POA: Diagnosis not present

## 2019-05-19 DIAGNOSIS — I110 Hypertensive heart disease with heart failure: Secondary | ICD-10-CM | POA: Diagnosis not present

## 2019-05-19 DIAGNOSIS — I2510 Atherosclerotic heart disease of native coronary artery without angina pectoris: Secondary | ICD-10-CM | POA: Diagnosis not present

## 2019-05-21 ENCOUNTER — Other Ambulatory Visit: Payer: Self-pay | Admitting: Family Medicine

## 2019-05-25 ENCOUNTER — Other Ambulatory Visit: Payer: Self-pay

## 2019-05-25 ENCOUNTER — Telehealth: Payer: Self-pay | Admitting: Cardiology

## 2019-05-25 MED ORDER — NITROGLYCERIN 0.4 MG/SPRAY TL SOLN: 1.0000 | 3 refills | Status: DC | PRN

## 2019-05-25 NOTE — Telephone Encounter (Signed)
Routed to [facility] LPN

## 2019-05-25 NOTE — Telephone Encounter (Signed)
Spoke to patient she stated her insurance will no longer cover NTG spray.Spoke to our pharmacist Erasmo Downer, she stated it was approved before due to a shortage in tablets.No shortage in tablets now.Patient prefers the spray.She will try [facility].She will call back if she needs a prescription for NTG tablets.

## 2019-05-25 NOTE — Telephone Encounter (Signed)
Patient is calling about her nitroGLYCERIN (NITROLINGUAL) 0.4 MG/SPRAY spray, she stated her insurance won't cover it, they have covered it in the past.

## 2019-05-26 ENCOUNTER — Telehealth: Payer: Self-pay | Admitting: Family Medicine

## 2019-05-26 DIAGNOSIS — I5042 Chronic combined systolic (congestive) and diastolic (congestive) heart failure: Secondary | ICD-10-CM | POA: Diagnosis not present

## 2019-05-26 DIAGNOSIS — Z7984 Long term (current) use of oral hypoglycemic drugs: Secondary | ICD-10-CM | POA: Diagnosis not present

## 2019-05-26 DIAGNOSIS — Z7951 Long term (current) use of inhaled steroids: Secondary | ICD-10-CM | POA: Diagnosis not present

## 2019-05-26 DIAGNOSIS — G4733 Obstructive sleep apnea (adult) (pediatric): Secondary | ICD-10-CM | POA: Diagnosis not present

## 2019-05-26 DIAGNOSIS — J449 Chronic obstructive pulmonary disease, unspecified: Secondary | ICD-10-CM | POA: Diagnosis not present

## 2019-05-26 DIAGNOSIS — Z9181 History of falling: Secondary | ICD-10-CM | POA: Diagnosis not present

## 2019-05-26 DIAGNOSIS — Z7982 Long term (current) use of aspirin: Secondary | ICD-10-CM | POA: Diagnosis not present

## 2019-05-26 DIAGNOSIS — Z951 Presence of aortocoronary bypass graft: Secondary | ICD-10-CM | POA: Diagnosis not present

## 2019-05-26 DIAGNOSIS — M797 Fibromyalgia: Secondary | ICD-10-CM | POA: Diagnosis not present

## 2019-05-26 DIAGNOSIS — I110 Hypertensive heart disease with heart failure: Secondary | ICD-10-CM | POA: Diagnosis not present

## 2019-05-26 DIAGNOSIS — I2510 Atherosclerotic heart disease of native coronary artery without angina pectoris: Secondary | ICD-10-CM | POA: Diagnosis not present

## 2019-05-26 DIAGNOSIS — I255 Ischemic cardiomyopathy: Secondary | ICD-10-CM | POA: Diagnosis not present

## 2019-05-26 DIAGNOSIS — J9610 Chronic respiratory failure, unspecified whether with hypoxia or hypercapnia: Secondary | ICD-10-CM | POA: Diagnosis not present

## 2019-05-26 DIAGNOSIS — F329 Major depressive disorder, single episode, unspecified: Secondary | ICD-10-CM | POA: Diagnosis not present

## 2019-05-26 DIAGNOSIS — E1142 Type 2 diabetes mellitus with diabetic polyneuropathy: Secondary | ICD-10-CM | POA: Diagnosis not present

## 2019-05-26 DIAGNOSIS — F17210 Nicotine dependence, cigarettes, uncomplicated: Secondary | ICD-10-CM | POA: Diagnosis not present

## 2019-05-26 DIAGNOSIS — G43909 Migraine, unspecified, not intractable, without status migrainosus: Secondary | ICD-10-CM | POA: Diagnosis not present

## 2019-05-26 DIAGNOSIS — F419 Anxiety disorder, unspecified: Secondary | ICD-10-CM | POA: Diagnosis not present

## 2019-05-26 DIAGNOSIS — Z8673 Personal history of transient ischemic attack (TIA), and cerebral infarction without residual deficits: Secondary | ICD-10-CM | POA: Diagnosis not present

## 2019-05-26 DIAGNOSIS — I252 Old myocardial infarction: Secondary | ICD-10-CM | POA: Diagnosis not present

## 2019-05-26 DIAGNOSIS — Z7902 Long term (current) use of antithrombotics/antiplatelets: Secondary | ICD-10-CM | POA: Diagnosis not present

## 2019-05-26 NOTE — Telephone Encounter (Signed)
Please ok that verbal order  

## 2019-05-26 NOTE — Telephone Encounter (Signed)
Cecilie Lowers PT with [facility] would like to see if they can continue PT with the patient  2x a week for 4 more weeks to work on balance and endurance   C/B # [phone]

## 2019-05-26 NOTE — Telephone Encounter (Signed)
Verbal order left on Craig's VM

## 2019-05-28 DIAGNOSIS — I255 Ischemic cardiomyopathy: Secondary | ICD-10-CM | POA: Diagnosis not present

## 2019-05-28 DIAGNOSIS — I2510 Atherosclerotic heart disease of native coronary artery without angina pectoris: Secondary | ICD-10-CM | POA: Diagnosis not present

## 2019-05-28 DIAGNOSIS — J449 Chronic obstructive pulmonary disease, unspecified: Secondary | ICD-10-CM | POA: Diagnosis not present

## 2019-05-28 DIAGNOSIS — J9610 Chronic respiratory failure, unspecified whether with hypoxia or hypercapnia: Secondary | ICD-10-CM | POA: Diagnosis not present

## 2019-05-28 DIAGNOSIS — I5042 Chronic combined systolic (congestive) and diastolic (congestive) heart failure: Secondary | ICD-10-CM | POA: Diagnosis not present

## 2019-05-28 DIAGNOSIS — I110 Hypertensive heart disease with heart failure: Secondary | ICD-10-CM | POA: Diagnosis not present

## 2019-06-03 DIAGNOSIS — I5042 Chronic combined systolic (congestive) and diastolic (congestive) heart failure: Secondary | ICD-10-CM | POA: Diagnosis not present

## 2019-06-03 DIAGNOSIS — I255 Ischemic cardiomyopathy: Secondary | ICD-10-CM | POA: Diagnosis not present

## 2019-06-03 DIAGNOSIS — J9610 Chronic respiratory failure, unspecified whether with hypoxia or hypercapnia: Secondary | ICD-10-CM | POA: Diagnosis not present

## 2019-06-03 DIAGNOSIS — I110 Hypertensive heart disease with heart failure: Secondary | ICD-10-CM | POA: Diagnosis not present

## 2019-06-03 DIAGNOSIS — J449 Chronic obstructive pulmonary disease, unspecified: Secondary | ICD-10-CM | POA: Diagnosis not present

## 2019-06-03 DIAGNOSIS — I2510 Atherosclerotic heart disease of native coronary artery without angina pectoris: Secondary | ICD-10-CM | POA: Diagnosis not present

## 2019-06-04 DIAGNOSIS — J449 Chronic obstructive pulmonary disease, unspecified: Secondary | ICD-10-CM | POA: Diagnosis not present

## 2019-06-04 DIAGNOSIS — I110 Hypertensive heart disease with heart failure: Secondary | ICD-10-CM | POA: Diagnosis not present

## 2019-06-04 DIAGNOSIS — I2510 Atherosclerotic heart disease of native coronary artery without angina pectoris: Secondary | ICD-10-CM | POA: Diagnosis not present

## 2019-06-04 DIAGNOSIS — I5042 Chronic combined systolic (congestive) and diastolic (congestive) heart failure: Secondary | ICD-10-CM | POA: Diagnosis not present

## 2019-06-04 DIAGNOSIS — J9610 Chronic respiratory failure, unspecified whether with hypoxia or hypercapnia: Secondary | ICD-10-CM | POA: Diagnosis not present

## 2019-06-04 DIAGNOSIS — I255 Ischemic cardiomyopathy: Secondary | ICD-10-CM | POA: Diagnosis not present

## 2019-06-09 DIAGNOSIS — I509 Heart failure, unspecified: Secondary | ICD-10-CM | POA: Diagnosis not present

## 2019-06-09 DIAGNOSIS — E7800 Pure hypercholesterolemia, unspecified: Secondary | ICD-10-CM | POA: Diagnosis not present

## 2019-06-09 DIAGNOSIS — E041 Nontoxic single thyroid nodule: Secondary | ICD-10-CM | POA: Diagnosis not present

## 2019-06-09 DIAGNOSIS — E1121 Type 2 diabetes mellitus with diabetic nephropathy: Secondary | ICD-10-CM | POA: Diagnosis not present

## 2019-06-09 DIAGNOSIS — G629 Polyneuropathy, unspecified: Secondary | ICD-10-CM | POA: Diagnosis not present

## 2019-06-09 DIAGNOSIS — E119 Type 2 diabetes mellitus without complications: Secondary | ICD-10-CM | POA: Diagnosis not present

## 2019-06-09 DIAGNOSIS — I10 Essential (primary) hypertension: Secondary | ICD-10-CM | POA: Diagnosis not present

## 2019-06-10 DIAGNOSIS — I5042 Chronic combined systolic (congestive) and diastolic (congestive) heart failure: Secondary | ICD-10-CM | POA: Diagnosis not present

## 2019-06-10 DIAGNOSIS — I255 Ischemic cardiomyopathy: Secondary | ICD-10-CM | POA: Diagnosis not present

## 2019-06-10 DIAGNOSIS — J449 Chronic obstructive pulmonary disease, unspecified: Secondary | ICD-10-CM | POA: Diagnosis not present

## 2019-06-10 DIAGNOSIS — I2510 Atherosclerotic heart disease of native coronary artery without angina pectoris: Secondary | ICD-10-CM | POA: Diagnosis not present

## 2019-06-10 DIAGNOSIS — J9610 Chronic respiratory failure, unspecified whether with hypoxia or hypercapnia: Secondary | ICD-10-CM | POA: Diagnosis not present

## 2019-06-10 DIAGNOSIS — I110 Hypertensive heart disease with heart failure: Secondary | ICD-10-CM | POA: Diagnosis not present

## 2019-06-12 DIAGNOSIS — I110 Hypertensive heart disease with heart failure: Secondary | ICD-10-CM | POA: Diagnosis not present

## 2019-06-12 DIAGNOSIS — J449 Chronic obstructive pulmonary disease, unspecified: Secondary | ICD-10-CM | POA: Diagnosis not present

## 2019-06-12 DIAGNOSIS — I2510 Atherosclerotic heart disease of native coronary artery without angina pectoris: Secondary | ICD-10-CM | POA: Diagnosis not present

## 2019-06-12 DIAGNOSIS — I255 Ischemic cardiomyopathy: Secondary | ICD-10-CM | POA: Diagnosis not present

## 2019-06-12 DIAGNOSIS — J9610 Chronic respiratory failure, unspecified whether with hypoxia or hypercapnia: Secondary | ICD-10-CM | POA: Diagnosis not present

## 2019-06-12 DIAGNOSIS — I5042 Chronic combined systolic (congestive) and diastolic (congestive) heart failure: Secondary | ICD-10-CM | POA: Diagnosis not present

## 2019-06-17 DIAGNOSIS — I2510 Atherosclerotic heart disease of native coronary artery without angina pectoris: Secondary | ICD-10-CM | POA: Diagnosis not present

## 2019-06-17 DIAGNOSIS — I110 Hypertensive heart disease with heart failure: Secondary | ICD-10-CM | POA: Diagnosis not present

## 2019-06-17 DIAGNOSIS — I5042 Chronic combined systolic (congestive) and diastolic (congestive) heart failure: Secondary | ICD-10-CM | POA: Diagnosis not present

## 2019-06-17 DIAGNOSIS — I255 Ischemic cardiomyopathy: Secondary | ICD-10-CM | POA: Diagnosis not present

## 2019-06-17 DIAGNOSIS — J9610 Chronic respiratory failure, unspecified whether with hypoxia or hypercapnia: Secondary | ICD-10-CM | POA: Diagnosis not present

## 2019-06-17 DIAGNOSIS — J449 Chronic obstructive pulmonary disease, unspecified: Secondary | ICD-10-CM | POA: Diagnosis not present

## 2019-06-19 DIAGNOSIS — J449 Chronic obstructive pulmonary disease, unspecified: Secondary | ICD-10-CM | POA: Diagnosis not present

## 2019-06-19 DIAGNOSIS — I255 Ischemic cardiomyopathy: Secondary | ICD-10-CM | POA: Diagnosis not present

## 2019-06-19 DIAGNOSIS — J9610 Chronic respiratory failure, unspecified whether with hypoxia or hypercapnia: Secondary | ICD-10-CM | POA: Diagnosis not present

## 2019-06-19 DIAGNOSIS — I110 Hypertensive heart disease with heart failure: Secondary | ICD-10-CM | POA: Diagnosis not present

## 2019-06-19 DIAGNOSIS — I2510 Atherosclerotic heart disease of native coronary artery without angina pectoris: Secondary | ICD-10-CM | POA: Diagnosis not present

## 2019-06-19 DIAGNOSIS — I5042 Chronic combined systolic (congestive) and diastolic (congestive) heart failure: Secondary | ICD-10-CM | POA: Diagnosis not present

## 2019-06-23 ENCOUNTER — Other Ambulatory Visit: Payer: Self-pay | Admitting: Cardiology

## 2019-06-23 ENCOUNTER — Ambulatory Visit (INDEPENDENT_AMBULATORY_CARE_PROVIDER_SITE_OTHER): Payer: Medicare Other | Admitting: *Deleted

## 2019-06-23 DIAGNOSIS — Z23 Encounter for immunization: Secondary | ICD-10-CM | POA: Diagnosis not present

## 2019-06-24 DIAGNOSIS — J9610 Chronic respiratory failure, unspecified whether with hypoxia or hypercapnia: Secondary | ICD-10-CM | POA: Diagnosis not present

## 2019-06-24 DIAGNOSIS — I5042 Chronic combined systolic (congestive) and diastolic (congestive) heart failure: Secondary | ICD-10-CM | POA: Diagnosis not present

## 2019-06-24 DIAGNOSIS — I110 Hypertensive heart disease with heart failure: Secondary | ICD-10-CM | POA: Diagnosis not present

## 2019-06-24 DIAGNOSIS — J449 Chronic obstructive pulmonary disease, unspecified: Secondary | ICD-10-CM | POA: Diagnosis not present

## 2019-06-24 DIAGNOSIS — I255 Ischemic cardiomyopathy: Secondary | ICD-10-CM | POA: Diagnosis not present

## 2019-06-24 DIAGNOSIS — I2510 Atherosclerotic heart disease of native coronary artery without angina pectoris: Secondary | ICD-10-CM | POA: Diagnosis not present

## 2019-06-25 DIAGNOSIS — I110 Hypertensive heart disease with heart failure: Secondary | ICD-10-CM | POA: Diagnosis not present

## 2019-06-25 DIAGNOSIS — I252 Old myocardial infarction: Secondary | ICD-10-CM | POA: Diagnosis not present

## 2019-06-25 DIAGNOSIS — Z7902 Long term (current) use of antithrombotics/antiplatelets: Secondary | ICD-10-CM | POA: Diagnosis not present

## 2019-06-25 DIAGNOSIS — Z951 Presence of aortocoronary bypass graft: Secondary | ICD-10-CM | POA: Diagnosis not present

## 2019-06-25 DIAGNOSIS — Z8673 Personal history of transient ischemic attack (TIA), and cerebral infarction without residual deficits: Secondary | ICD-10-CM | POA: Diagnosis not present

## 2019-06-25 DIAGNOSIS — I2510 Atherosclerotic heart disease of native coronary artery without angina pectoris: Secondary | ICD-10-CM | POA: Diagnosis not present

## 2019-06-25 DIAGNOSIS — Z7984 Long term (current) use of oral hypoglycemic drugs: Secondary | ICD-10-CM | POA: Diagnosis not present

## 2019-06-25 DIAGNOSIS — F329 Major depressive disorder, single episode, unspecified: Secondary | ICD-10-CM | POA: Diagnosis not present

## 2019-06-25 DIAGNOSIS — J9610 Chronic respiratory failure, unspecified whether with hypoxia or hypercapnia: Secondary | ICD-10-CM | POA: Diagnosis not present

## 2019-06-25 DIAGNOSIS — I5042 Chronic combined systolic (congestive) and diastolic (congestive) heart failure: Secondary | ICD-10-CM | POA: Diagnosis not present

## 2019-06-25 DIAGNOSIS — F17210 Nicotine dependence, cigarettes, uncomplicated: Secondary | ICD-10-CM | POA: Diagnosis not present

## 2019-06-25 DIAGNOSIS — F419 Anxiety disorder, unspecified: Secondary | ICD-10-CM | POA: Diagnosis not present

## 2019-06-25 DIAGNOSIS — J449 Chronic obstructive pulmonary disease, unspecified: Secondary | ICD-10-CM | POA: Diagnosis not present

## 2019-06-25 DIAGNOSIS — G4733 Obstructive sleep apnea (adult) (pediatric): Secondary | ICD-10-CM | POA: Diagnosis not present

## 2019-06-25 DIAGNOSIS — E1142 Type 2 diabetes mellitus with diabetic polyneuropathy: Secondary | ICD-10-CM | POA: Diagnosis not present

## 2019-06-25 DIAGNOSIS — Z9181 History of falling: Secondary | ICD-10-CM | POA: Diagnosis not present

## 2019-06-25 DIAGNOSIS — G43909 Migraine, unspecified, not intractable, without status migrainosus: Secondary | ICD-10-CM | POA: Diagnosis not present

## 2019-06-25 DIAGNOSIS — Z7982 Long term (current) use of aspirin: Secondary | ICD-10-CM | POA: Diagnosis not present

## 2019-06-25 DIAGNOSIS — I255 Ischemic cardiomyopathy: Secondary | ICD-10-CM | POA: Diagnosis not present

## 2019-06-25 DIAGNOSIS — Z7951 Long term (current) use of inhaled steroids: Secondary | ICD-10-CM | POA: Diagnosis not present

## 2019-06-25 DIAGNOSIS — M797 Fibromyalgia: Secondary | ICD-10-CM | POA: Diagnosis not present

## 2019-06-26 DIAGNOSIS — I110 Hypertensive heart disease with heart failure: Secondary | ICD-10-CM | POA: Diagnosis not present

## 2019-06-26 DIAGNOSIS — I255 Ischemic cardiomyopathy: Secondary | ICD-10-CM | POA: Diagnosis not present

## 2019-06-26 DIAGNOSIS — I5042 Chronic combined systolic (congestive) and diastolic (congestive) heart failure: Secondary | ICD-10-CM | POA: Diagnosis not present

## 2019-06-26 DIAGNOSIS — J449 Chronic obstructive pulmonary disease, unspecified: Secondary | ICD-10-CM | POA: Diagnosis not present

## 2019-06-26 DIAGNOSIS — J9610 Chronic respiratory failure, unspecified whether with hypoxia or hypercapnia: Secondary | ICD-10-CM | POA: Diagnosis not present

## 2019-06-26 DIAGNOSIS — I2510 Atherosclerotic heart disease of native coronary artery without angina pectoris: Secondary | ICD-10-CM | POA: Diagnosis not present

## 2019-07-10 ENCOUNTER — Other Ambulatory Visit: Payer: Self-pay

## 2019-07-10 ENCOUNTER — Ambulatory Visit (INDEPENDENT_AMBULATORY_CARE_PROVIDER_SITE_OTHER)
Admission: RE | Admit: 2019-07-10 | Discharge: 2019-07-10 | Disposition: A | Discharge status: Home / Self Care | Payer: Medicare Other | Source: Ambulatory Visit | Attending: Acute Care | Admitting: Acute Care

## 2019-07-10 DIAGNOSIS — Z122 Encounter for screening for malignant neoplasm of respiratory organs: Secondary | ICD-10-CM

## 2019-07-10 DIAGNOSIS — F17200 Nicotine dependence, unspecified, uncomplicated: Secondary | ICD-10-CM | POA: Diagnosis not present

## 2019-07-10 DIAGNOSIS — Z87891 Personal history of nicotine dependence: Secondary | ICD-10-CM

## 2019-07-10 DIAGNOSIS — F17210 Nicotine dependence, cigarettes, uncomplicated: Secondary | ICD-10-CM | POA: Diagnosis not present

## 2019-07-11 ENCOUNTER — Other Ambulatory Visit: Payer: Self-pay | Admitting: Family Medicine

## 2019-07-13 ENCOUNTER — Other Ambulatory Visit: Payer: Self-pay | Admitting: *Deleted

## 2019-07-13 ENCOUNTER — Other Ambulatory Visit: Payer: Self-pay | Admitting: Cardiology

## 2019-07-13 DIAGNOSIS — Z87891 Personal history of nicotine dependence: Secondary | ICD-10-CM

## 2019-07-13 DIAGNOSIS — Z122 Encounter for screening for malignant neoplasm of respiratory organs: Secondary | ICD-10-CM

## 2019-07-13 DIAGNOSIS — F17210 Nicotine dependence, cigarettes, uncomplicated: Secondary | ICD-10-CM

## 2019-07-15 NOTE — Progress Notes (Signed)
Diane [name] Date of Birth: [birth year] Medical Record #[id number]  History of Present Illness: Diane [name] is seen for follow up CAD. She has a history of coronary disease and is status post CABG in March of 2007 by Dr. [name]. She presented later that year with recurrent angina. Repeat cardiac cath showed patent LIMA to the LAD but all other grafts occluded including SVG to OM, SVG to AC/PL, and radial graft to ramus intermediate. She then had complex stenting of the mid LCx and first OM with Taxus stents. The native RCA was occluded with collaterals.   In 2016 she was evaluated for abdominal surgery. Myoview abnormal. Cardiac cath as noted above but now stents in LCx and OM occluded. No suitable targets for PCI or surgery. Severe LV dysfunction with moderate pulmonary HTN and normal LV filling pressures. She has multiple cardiac risk factors including diabetes, dyslipidemia, hypertension, and tobacco abuse. She also has  bilateral carotid arterial disease. She continues to smoke.   In August 2016 she had removal of a large pelvic mass with BSO and sigmoid colectomy. Path c/w ovarian cystadenoma. No complications.   She is followed by Dr. Gwenlyn Found for her carotid disease. Angiogram performed with findings of 70% bilateral stenoses. The LICA was heavily calcified. Both arteries had acute angulation after the stenosis making it less favorable for stenting. In March she developed dizziness, lightheadedness, and imbalance. Dr. Glori Bickers ordered an MRI that showed an old right MCA infarct and chronic lacunar infarcts that were new from 2014.   On a prior visit with me we switched her losartan to [facility]. She developed a persistent cough with clear phlegm. [name] was stopped and losartan resumed- cough resolved.  CXR showed no active disease.   She previously complained of pain in her thighs bilaterally. LE dopplers shows good ABIs of 0.93 bilaterally. Mild right iliac stenosis. Moderate right femoral and  SFA disease. Seen by Dr. Gwenlyn Found and medical therapy recommended. This past fall carotid dopplers showed progression of right carotid disease. She was not a candidate for CEA so underwent right carotid stenting with good results.   She reports she had a bad fall in 2019 while standing in a [place] shop. Legs just gave way and she fell.  She reports now that she is feeling progressively weaker and more unsteady. Has to use a walker now. Legs get weak and she gives out walking across her house. Has occasional chest pain that has not changed. Occasional heart skipping. She is chronically SOB. No edema. Last A1c 6.7 %. Feels cold. She was recently diagnosed with anemia. Hgb 9.9. FOBT positive. She did have upper EGD without acute findings.  In June she was noted to be hypotensive. Coreg dose was reduced.   On follow up today she notes episodes of chest pain 3-5xWeek. Takes sl Ntg. Feels lightheaded a lot and has problems with balance. Still working with PT. Continues to smoke 1 PPD. glimeperide was discontinued due to episodes of hypoglycemia. Now on Januvia.   Current Outpatient Medications on File Prior to Visit  Medication Sig Dispense Refill  . acetaminophen (TYLENOL) 500 MG tablet Take 1,000 mg by mouth every 6 (six) hours as needed for moderate pain or headache.    . albuterol (VENTOLIN HFA) 108 (90 Base) MCG/ACT inhaler INHALER 2 PUFFS EVERY 6 HOURS AS NEEDED FOR WHEEZING/SHORTNESS OF BREATH 1 Inhaler 6  . aspirin EC 81 MG tablet Take 81 mg by mouth every evening.  30 tablet 6  . carvedilol (  COREG) 12.5 MG tablet Take 1/2 tablet ( 6.25 mg ) twice a day 60 tablet 6  . cetirizine (ZYRTEC) 10 MG tablet Take 10 mg by mouth daily as needed for allergies.    [name] clopidogrel (PLAVIX) 75 MG tablet TAKE 1 TABLET BY MOUTH EVERY DAY 90 tablet 1  . dexlansoprazole (DEXILANT) 60 MG capsule TAKE 1 CAPSULE BY MOUTH EVERY DAY 90 capsule 3  . diphenhydrAMINE (BENADRYL) 25 mg capsule Take 2 capsules in AM and 1  capsule at night (Patient taking differently: Take 50 mg by mouth 2 (two) times daily as needed for itching or allergies. ) 270 capsule 3  . fluticasone (FLONASE) 50 MCG/ACT nasal spray Place 2 sprays into both nostrils daily. 16 g 11  . furosemide (LASIX) 40 MG tablet TAKE 1 TABLET BY MOUTH EVERY DAY 90 tablet 3  . gabapentin (NEURONTIN) 300 MG capsule TAKE 2 CAPSULES BY MOUTH EVERY DAY AT BEDTIME (Patient taking differently: Take 600 mg by mouth at bedtime. ) 180 capsule 1  . guaiFENesin (MUCINEX) 600 MG 12 hr tablet Take 2 tablets (1,200 mg total) by mouth 2 (two) times daily. 360 tablet 3  . iron polysaccharides (NU-IRON) 150 MG capsule Take 1 capsule (150 mg total) by mouth 2 (two) times daily. 180 capsule 3  . isosorbide mononitrate (IMDUR) 30 MG 24 hr tablet TAKE 1 TABLET BY MOUTH EVERY DAY 90 tablet 2  . JANUVIA 50 MG tablet Take 50 mg by mouth daily.    [name] KLOR-CON M10 10 MEQ tablet TAKE 1 TABLET BY MOUTH TWICE A DAY 180 tablet 3  . levETIRAcetam (KEPPRA) 1000 MG tablet TAKE 1 TABLET BY MOUTH TWICE A DAY 180 tablet 3  . losartan (COZAAR) 50 MG tablet Take 1 tablet (50 mg total) by mouth daily. (Patient taking differently: Take 25 mg by mouth daily. ) 90 tablet 3  . metFORMIN (GLUCOPHAGE-XR) 500 MG 24 hr tablet Take 500 mg by mouth 2 (two) times daily.  6  . nicotine polacrilex (NICORETTE) 4 MG gum Take 1 each (4 mg total) by mouth as needed for smoking cessation. 90 tablet 2  . nitroGLYCERIN (NITROLINGUAL) 0.4 MG/SPRAY spray Place 1 spray under the tongue every 5 (five) minutes x 3 doses as needed for chest pain. 12 g 3  . nystatin (NYSTATIN) powder Apply topically 4 (four) times daily. 15 g 3  . ONE TOUCH ULTRA TEST test strip USE AS DIRECTED FOR TESTING BLOOD GLUCOSE 3 TIMES DAILY  1  . polyethylene glycol powder (GLYCOLAX/MIRALAX) powder TAKE 1 DOSE 17 GRAMS IN 8 OZ OF WATER DAILY AS NEEDED FOR CONSTIPATION. (Patient taking differently: Take 17 g by mouth daily. Take 1 dose 17 grams in 8  oz of water daily for constipation.) 527 g 3  . ranolazine (RANEXA) 1000 MG SR tablet Take 1 tablet (1,000 mg total) by mouth 2 (two) times daily. 180 tablet 3  . rosuvastatin (CRESTOR) 10 MG tablet TAKE 1 TABLET BY MOUTH EVERY DAY 90 tablet 0  . sodium chloride (OCEAN) 0.65 % SOLN nasal spray Place 1 spray into both nostrils as needed for congestion.    . vitamin B-12 (CYANOCOBALAMIN) 1000 MCG tablet Take 1,000 mcg by mouth daily.     No current facility-administered medications on file prior to visit.     Allergies  Allergen Reactions  . Bee Venom Itching and Swelling  . Entresto [Sacubitril-Valsartan] Cough  . Tetracycline     Unknown   . Ace Inhibitors Cough  Past Medical History:  Diagnosis Date  . Anemia   . Carotid stenosis    a. s/p right carotid stent 10/2017.  [name] Chronic systolic CHF (congestive heart failure) ([place])   . Colon polyps   . COPD (chronic obstructive pulmonary disease) ([place])   . Coronary artery disease    post CABG in 3/07 , coronary stents   . Diabetes mellitus    type 2  . Dyslipidemia   . Fibromyalgia   . GERD (gastroesophageal reflux disease)   . Headache    hx of   . Hyperlipidemia   . Hypertension   . Myocardial infarction ([place])   . Pneumonia    hx of   . Seizures ([place])    last seizure- 03/2013   . Shortness of breath dyspnea    with exertion or when fluid builds up   . Sleep apnea    used to wear a cpap- not used in 3 years   . Status post dilation of esophageal narrowing   . Stroke [facility]) 1993   problems with balance   . Systolic murmur    known mild AS and MR  . Thyroid goiter   . Tobacco abuse     Past Surgical History:  Procedure Laterality Date  . APPENDECTOMY    . BIOPSY  02/03/2019   Procedure: BIOPSY;  Surgeon: Ladene Artist, MD;  Location: [facility];  Service: Endoscopy;;  . BREAST BIOPSY Left 2016  . CARDIAC CATHETERIZATION  11/29/05   EF of 55%  . CARDIAC CATHETERIZATION  08/06/06   EF of 45-50%  . CARDIAC  CATHETERIZATION N/A 05/06/2015   Procedure: Right/Left Heart Cath and Coronary/Graft Angiography;  Surgeon: Peter M Martinique, MD;  Location: [place] [facility];  Service: Cardiovascular;  Laterality: N/A;  . CAROTID PTA/STENT INTERVENTION N/A 10/10/2017   Procedure: CAROTID PTA/STENT INTERVENTION - Right;  Surgeon: Serafina Mitchell, MD;  Location: [place] [facility];  Service: Cardiovascular;  Laterality: N/A;  . CERVICAL FUSION  1990  . CHOLECYSTECTOMY    . COLON RESECTION     mass removed and 4 in of colon  . COLONOSCOPY WITH PROPOFOL N/A 07/16/2017   Procedure: COLONOSCOPY WITH PROPOFOL;  Surgeon: Ladene Artist, MD;  Location: [facility];  Service: Endoscopy;  Laterality: N/A;  . CORONARY ARTERY BYPASS GRAFT  12/04/2005   x5 -- left internal mammary artery to the LAD, left radial artery to the ramus intermedius, saphenous vein graft to the obtuse marginal 1, sequential saphenous vein grat to the acute marginal and posterior descending, endoscopic vein harvesting from the left thigh with open vein harvest from right leg  . CORONARY STENT PLACEMENT  08/11/06   PCI of her ciurcumflex/OM vessel  . ESOPHAGOGASTRODUODENOSCOPY (EGD) WITH PROPOFOL N/A 02/03/2019   Procedure: ESOPHAGOGASTRODUODENOSCOPY (EGD) WITH PROPOFOL;  Surgeon: Ladene Artist, MD;  Location: [facility];  Service: Endoscopy;  Laterality: N/A;  . LAPAROTOMY Bilateral 05/19/2015   Procedure: EXPLORATORY LAPAROTOMY WITH BILATERAL SALPINGO OOPHORECTOMY /OMENTECTOMY/SEGMENTAL SIGMOID COLECTOMY ;  Surgeon: Everitt Amber, MD;  Location: [facility];  Service: Gynecology;  Laterality: Bilateral;  . PERIPHERAL VASCULAR CATHETERIZATION N/A 11/15/2015   Procedure: Carotid PTA/Stent Intervention;  Surgeon: Lorretta Harp, MD;  Location: [place] [facility];  Service: Cardiovascular;  Laterality: N/A;  . PERIPHERAL VASCULAR CATHETERIZATION  11/15/2015   Procedure: Carotid Angiography;  Surgeon: Lorretta Harp, MD;  Location: [place] [facility];   Service: Cardiovascular;;    Social History   Tobacco Use  Smoking Status Current Every Day Smoker  . Packs/day: 1.00  . Years: 51.00  . Pack years: 51.00  . Types: Cigarettes  . Start date: 34  Smokeless Tobacco Never Used  Tobacco Comment   04/17/19 down to 1 pack her day--LCR  1.5 pack to 2 packs per day. Has been working on reducing the # of cigarettes she smokes, down to 1/2 PP7D    Social History   Substance and Sexual Activity  Alcohol Use No    Family History  Problem Relation Age of Onset  . Heart disease Mother   . Diabetes Mother   . COPD Mother   . Hyperlipidemia Mother   . Hypertension Mother   . Cancer Father        met, origin unknown  . Heart attack Father   . Drug abuse Paternal Grandmother   . Stroke Paternal Grandfather   . Lung cancer Paternal Aunt        lung with mets to brain  . Melanoma Paternal Uncle   . Lung cancer Paternal Uncle        lung/liver to brain  . Cancer Paternal Uncle        cancer of unknown type  . Heart disease Maternal Grandfather   . Hypertension Sister   . Cancer Sister        eyelid  . Glaucoma Sister   . Heart disease Maternal Aunt        x 2 aunts    Review of Systems: As noted in history of present illness.  All other systems were reviewed and are negative.  Physical Exam: BP 117/69   Pulse 86   Ht '4\' 9"'  (1.448 m)   Wt 140 lb 6.4 oz (63.7 kg)   LMP 09/02/2003   SpO2 99%   BMI 30.38 kg/m  GENERAL:  Well appearing WF in NAD HEENT:  PERRL, EOMI, sclera are clear. Oropharynx is clear. NECK:  No jugular venous distention, bilateral carotid bruits L>R, no thyromegaly or adenopathy LUNGS:  Clear to auscultation bilaterally CHEST:  Unremarkable HEART:  RRR,  PMI not displaced or sustained,S1 and S2 within normal limits, no S3, no S4: no clicks, no rubs, gr 2/6 systolic murmur RUSB.  ABD:  Soft, nontender. BS +, no masses or bruits. No hepatomegaly, no splenomegaly EXT:  1+ pulses throughout, no edema, no  cyanosis no clubbing SKIN:  Warm and dry.  No rashes NEURO:  Alert and oriented x 3. Cranial nerves II through XII intact. PSYCH:  Cognitively intact    LABORATORY DATA: Lab Results  Component Value Date   WBC 5.2 04/21/2019   HGB 11.3 (L) 04/21/2019   HCT 35.9 (L) 04/21/2019   PLT 242.0 04/21/2019   GLUCOSE 183 (H) 04/21/2019   CHOL 139 04/21/2019   TRIG 163.0 (H) 04/21/2019   HDL 45.10 04/21/2019   LDLDIRECT 76.0 04/11/2017   LDLCALC 61 04/21/2019   ALT 10 04/21/2019   AST 11 04/21/2019   NA 135 04/21/2019   K 5.1 04/21/2019   CL 101 04/21/2019   CREATININE 0.80 04/21/2019   BUN 16 04/21/2019   CO2 27 04/21/2019   TSH 1.80 04/21/2019   INR 1.05 11/10/2015   HGBA1C 6.7 11/03/2018   MICROALBUR 1.9 06/09/2013   Ecg today shows NSR with rate 86. Old inferior MI. T wave abnormality c/w lateral ischemia. Unchanged from prior. I have personally reviewed and interpreted this study.  Assessment / Plan: 1. CAD S/p CABG. Failed bypass grafts except  for LIMA to LAD. S/p remote stenting of LCX and OM with Taxus DES now occluded. Stable class 2 angina. Really unchanged from before. Continue aggressive antianginal Rx. With ASA, plavix, nitrates, coreg, and Ranexa. Encourage  smoking cessation.  2. Carotid arterial disese- s/p right carotid stenting. Followed by Dr. Gwenlyn Found. On chronic ASA/Plavix 3. Tobacco abuse. Recommend complete cessation.  4. HTN controlled. She is concerned that her BP gets too low at times although her readings don't suggest this. Continue current therapy. 5. Chronic combined systolic and diastolic CHF. Severe LV dysfunction. She appears to be well compensated today without edema. Weight stable. Sodium restriction.  6. Pulmonary HTN. 7. S/p major abdominal surgery for cystadenoma. Now with CT showing evidence of incisional hernia. Poor candidate for general anesthesia. Conservative management.  8. Lacunar infarcts.  9. PAD. Dopplers in May showed no significant  obstruction.  10. Iron deficiency anemia with FOBT positive. Prior EGD showed no reason for blood loss.   I will follow up in 6 months.

## 2019-07-17 ENCOUNTER — Telehealth: Payer: Self-pay | Admitting: Family Medicine

## 2019-07-17 NOTE — Telephone Encounter (Signed)
The B12 is labeled as pill instead of liquid Is she taking liquid instead ? How much?  Thanks

## 2019-07-17 NOTE — Telephone Encounter (Signed)
Almyra Free with [facility] is calling in regards to the patient's mediation she was prescribed. She stated that [facility] but because they are [facility] they will not fill them.  They would like the iron medication and Vitamin b12 liquid sent [facility] the  [facility].

## 2019-07-20 MED ORDER — POLYSACCHARIDE IRON COMPLEX 150 MG PO CAPS: 150.0000 mg | ORAL_CAPSULE | Freq: Two times a day (BID) | ORAL | 3 refills | Status: DC

## 2019-07-20 NOTE — Telephone Encounter (Signed)
No call back info left on message for Diane [name] so called pt to ask question and no answer so left VM requesting pt to call the office back

## 2019-07-20 NOTE — Telephone Encounter (Signed)
Pt said she is doing B12 sublingual liquid but she doesn't know the strength. Pt said that she is fine still getting that one OTC at [facility] because it's cheap. Pt just requested the [facility] to [facility]. Rx sent and pt aware

## 2019-07-21 ENCOUNTER — Ambulatory Visit (INDEPENDENT_AMBULATORY_CARE_PROVIDER_SITE_OTHER): Payer: Medicare Other | Admitting: Cardiology

## 2019-07-21 ENCOUNTER — Encounter: Payer: Self-pay | Admitting: Cardiology

## 2019-07-21 ENCOUNTER — Other Ambulatory Visit: Payer: Self-pay

## 2019-07-21 VITALS — BP 117/69 | HR 86 | Ht <= 58 in | Wt 140.4 lb

## 2019-07-21 DIAGNOSIS — I255 Ischemic cardiomyopathy: Secondary | ICD-10-CM

## 2019-07-21 DIAGNOSIS — I739 Peripheral vascular disease, unspecified: Secondary | ICD-10-CM

## 2019-07-21 DIAGNOSIS — I5042 Chronic combined systolic (congestive) and diastolic (congestive) heart failure: Secondary | ICD-10-CM | POA: Diagnosis not present

## 2019-07-21 DIAGNOSIS — I10 Essential (primary) hypertension: Secondary | ICD-10-CM | POA: Diagnosis not present

## 2019-07-21 DIAGNOSIS — I25708 Atherosclerosis of coronary artery bypass graft(s), unspecified, with other forms of angina pectoris: Secondary | ICD-10-CM | POA: Diagnosis not present

## 2019-07-21 DIAGNOSIS — Z951 Presence of aortocoronary bypass graft: Secondary | ICD-10-CM | POA: Diagnosis not present

## 2019-07-21 MED ORDER — FUROSEMIDE 40 MG PO TABS: 40.0000 mg | ORAL_TABLET | Freq: Every day | ORAL | 3 refills | Status: DC

## 2019-07-21 MED ORDER — LOSARTAN POTASSIUM 50 MG PO TABS: 50.0000 mg | ORAL_TABLET | Freq: Every day | ORAL | 3 refills | Status: DC

## 2019-07-21 MED ORDER — ISOSORBIDE MONONITRATE ER 30 MG PO TB24: 30.0000 mg | ORAL_TABLET | Freq: Every day | ORAL | 3 refills | Status: DC

## 2019-07-21 MED ORDER — CARVEDILOL 12.5 MG PO TABS: ORAL_TABLET | ORAL | 3 refills | Status: DC

## 2019-07-21 MED ORDER — ROSUVASTATIN CALCIUM 10 MG PO TABS: 10.0000 mg | ORAL_TABLET | Freq: Every day | ORAL | 3 refills | Status: DC

## 2019-07-21 MED ORDER — CLOPIDOGREL BISULFATE 75 MG PO TABS: 75.0000 mg | ORAL_TABLET | Freq: Every day | ORAL | 3 refills | Status: DC

## 2019-07-21 MED ORDER — RANOLAZINE ER 1000 MG PO TB12: 1000.0000 mg | ORAL_TABLET | Freq: Two times a day (BID) | ORAL | 3 refills | Status: DC

## 2019-07-21 MED ORDER — POTASSIUM CHLORIDE CRYS ER 10 MEQ PO TBCR: 10.0000 meq | EXTENDED_RELEASE_TABLET | Freq: Two times a day (BID) | ORAL | 3 refills | Status: DC

## 2019-07-22 ENCOUNTER — Other Ambulatory Visit: Payer: Self-pay | Admitting: Cardiovascular Disease

## 2019-09-01 DIAGNOSIS — E119 Type 2 diabetes mellitus without complications: Secondary | ICD-10-CM | POA: Diagnosis not present

## 2019-09-01 DIAGNOSIS — E7800 Pure hypercholesterolemia, unspecified: Secondary | ICD-10-CM | POA: Diagnosis not present

## 2019-09-01 DIAGNOSIS — E041 Nontoxic single thyroid nodule: Secondary | ICD-10-CM | POA: Diagnosis not present

## 2019-09-08 DIAGNOSIS — E7800 Pure hypercholesterolemia, unspecified: Secondary | ICD-10-CM | POA: Diagnosis not present

## 2019-09-08 DIAGNOSIS — E041 Nontoxic single thyroid nodule: Secondary | ICD-10-CM | POA: Diagnosis not present

## 2019-09-08 DIAGNOSIS — G629 Polyneuropathy, unspecified: Secondary | ICD-10-CM | POA: Diagnosis not present

## 2019-09-08 DIAGNOSIS — I10 Essential (primary) hypertension: Secondary | ICD-10-CM | POA: Diagnosis not present

## 2019-09-08 DIAGNOSIS — E119 Type 2 diabetes mellitus without complications: Secondary | ICD-10-CM | POA: Diagnosis not present

## 2019-09-08 DIAGNOSIS — I509 Heart failure, unspecified: Secondary | ICD-10-CM | POA: Diagnosis not present

## 2019-09-08 DIAGNOSIS — E1121 Type 2 diabetes mellitus with diabetic nephropathy: Secondary | ICD-10-CM | POA: Diagnosis not present

## 2019-09-17 ENCOUNTER — Telehealth: Payer: Self-pay

## 2019-09-17 DIAGNOSIS — I255 Ischemic cardiomyopathy: Secondary | ICD-10-CM

## 2019-09-17 DIAGNOSIS — I10 Essential (primary) hypertension: Secondary | ICD-10-CM

## 2019-09-17 NOTE — Telephone Encounter (Signed)
I will defer to PCP and cardiology.  Thanks.

## 2019-09-17 NOTE — Telephone Encounter (Signed)
V/M left by  Almyra Free nurse with [facility]; the nurse did not leave phone # and the # on this call was gotten from caller ID but have called the # 5 times and gets a v/m for [place] with [facility] the v/m said pt was visited today and had increase in wt of 5 lbs; pt is SOB and bilateral lower diminished lung sounds; vitals normal for her and pt is nauseated. Any orders needed and nurse will call on call physician as well. Nurse will update if any changes. I tried looking at google to get another # for [facility] and was unsuccessful.  I did speak with pt and she said Almyra Free called the oncall physician for [facility] and pt was advised to take an extra furosemide 40 mg and zofran was called in for nausea. Pt said the 5 lb wt gain was overnight. Pt said her pulse ox was 98% and does not feel in any distress. Pt is on O2 at 2L at night. Pt said Almyra Free is to call her tomorrow for update and I asked pt to have Almyra Free call us with update as well with a contact #. UC & ED precautions given to pt and pt voiced understanding.Pt gave me [facility] number [phone] and I spoke with Tiffany a nurse and she said Almyra Free is not there but will ask her to call us with update on 09/18/19 and leave contact # if has to leave v/m. Dr Glori Bickers out of office but will send to her and Dr Damita Dunnings as Juluis Rainier.

## 2019-09-17 NOTE — Telephone Encounter (Signed)
Agree with extra lasix dose x 1.   Rolland Steinert Martinique MD, [facility]

## 2019-09-17 NOTE — Telephone Encounter (Signed)
Thanks, I will also cc to Dr Martinique, her cardiologist

## 2019-09-17 NOTE — Telephone Encounter (Signed)
Please update me tomorrow, thanks

## 2019-09-18 NOTE — Telephone Encounter (Signed)
Please check on pt tomorrow  I did cc message to cardiology  Glad wt is down  I want to know if it goes back up or if more swelling develops

## 2019-09-18 NOTE — Telephone Encounter (Signed)
I will cc Dr Martinique for further advisement

## 2019-09-18 NOTE — Telephone Encounter (Signed)
Spoke with patient. Patient states she took extra lasix dose yesterday and her weight is 3 lbs down as of today. She was not told to take extra lasix today just yesterday. Feels nauseas still. She took Zofran yesterday at 7 pm and it helped, she took Zofran about 1 hour ago but has not helped yet. She has SOB but not severe-about the same as it has been when it started 3 weeks ago.  She has some chest tightness but not like CHF tightness feeling. Julie-nurse with [facility] has not called patient to check on her today yet.

## 2019-09-21 MED ORDER — FUROSEMIDE 40 MG PO TABS: 40.0000 mg | ORAL_TABLET | Freq: Two times a day (BID) | ORAL | 3 refills | Status: DC

## 2019-09-21 NOTE — Telephone Encounter (Signed)
Pt said she hasn't heard anything yet from cardiology. Pt said she did take that one extra lasix 40 mg the day she spoke with Anastasiya and she did have a 3 lbs weight loss. After going back to the once daily dosing of the lasix she has gained that 3 lbs back and she is having leg swelling again. She said it hasn't improved at all and she is just as SOB as she was when we spoke with her before. Pt not sure what to do

## 2019-09-21 NOTE — Telephone Encounter (Signed)
I would recommend increasing lasix to 40 mg twice a day. Should repeat BMET in a week or so.  Yancey Pedley Martinique MD, [facility]

## 2019-09-21 NOTE — Addendum Note (Signed)
Addended by: Kathyrn Lass on: 09/21/2019 03:09 PM   Modules accepted: Orders

## 2019-09-21 NOTE — Telephone Encounter (Signed)
Left VM requesting pt to call the office back 

## 2019-09-21 NOTE — Telephone Encounter (Signed)
Spoke to patient Dr.Jordan advised to increase lasix to 40 mg twice a day.Advised to have a bmet next week.Advised to call back if not better.

## 2019-09-22 ENCOUNTER — Other Ambulatory Visit: Payer: Self-pay | Admitting: Cardiology

## 2019-09-22 DIAGNOSIS — I429 Cardiomyopathy, unspecified: Secondary | ICD-10-CM

## 2019-10-11 ENCOUNTER — Other Ambulatory Visit: Payer: Self-pay | Admitting: Neurology

## 2019-10-15 ENCOUNTER — Telehealth: Payer: Self-pay | Admitting: Pharmacist Clinician (PhC)/ Clinical Pharmacy Specialist

## 2019-10-15 DIAGNOSIS — I110 Hypertensive heart disease with heart failure: Secondary | ICD-10-CM | POA: Diagnosis not present

## 2019-10-15 DIAGNOSIS — J449 Chronic obstructive pulmonary disease, unspecified: Secondary | ICD-10-CM | POA: Diagnosis not present

## 2019-10-15 DIAGNOSIS — I2510 Atherosclerotic heart disease of native coronary artery without angina pectoris: Secondary | ICD-10-CM | POA: Diagnosis not present

## 2019-10-15 NOTE — Telephone Encounter (Signed)
RN called, concerned that patient increased furosemide from 40 qd to 40 bid about 2 weeks ago.  Wanted to clarify if this was a permanent dose change or was there a stop date.  Also concerned as potassium dose (71mEq bid) was not increased at the same time.  She reports pt vitals stable (BP 128/72, o2 at 97% on room air) with HR elevated at 94.  Weight up 3 pounds in the past 2 weeks.  Complains of labored breathing.    Reviewed notes.  Dr. Martinique increased furosemide to bid on Dec 17, with repeat BMET after 1 week.    Advised RN to draw BMET today, as this had not yet been done.  No change to potassium dose until we know those results (last K 5.1 in July).  RN voiced understanding.  Will also arrange for patient so see PA in the next week or so - scheduling to call patient.

## 2019-10-21 ENCOUNTER — Telehealth: Payer: Self-pay | Admitting: Cardiology

## 2019-10-21 ENCOUNTER — Ambulatory Visit (INDEPENDENT_AMBULATORY_CARE_PROVIDER_SITE_OTHER): Payer: Medicare Other | Admitting: Cardiology

## 2019-10-21 ENCOUNTER — Other Ambulatory Visit: Payer: Self-pay

## 2019-10-21 VITALS — BP 110/64 | HR 94 | Ht <= 58 in | Wt 146.0 lb

## 2019-10-21 DIAGNOSIS — J988 Other specified respiratory disorders: Secondary | ICD-10-CM

## 2019-10-21 DIAGNOSIS — G4736 Sleep related hypoventilation in conditions classified elsewhere: Secondary | ICD-10-CM

## 2019-10-21 DIAGNOSIS — E7800 Pure hypercholesterolemia, unspecified: Secondary | ICD-10-CM | POA: Diagnosis not present

## 2019-10-21 DIAGNOSIS — Z951 Presence of aortocoronary bypass graft: Secondary | ICD-10-CM

## 2019-10-21 DIAGNOSIS — I255 Ischemic cardiomyopathy: Secondary | ICD-10-CM | POA: Diagnosis not present

## 2019-10-21 DIAGNOSIS — I6521 Occlusion and stenosis of right carotid artery: Secondary | ICD-10-CM | POA: Diagnosis not present

## 2019-10-21 DIAGNOSIS — I5022 Chronic systolic (congestive) heart failure: Secondary | ICD-10-CM | POA: Diagnosis not present

## 2019-10-21 DIAGNOSIS — E1142 Type 2 diabetes mellitus with diabetic polyneuropathy: Secondary | ICD-10-CM | POA: Diagnosis not present

## 2019-10-21 DIAGNOSIS — Z8673 Personal history of transient ischemic attack (TIA), and cerebral infarction without residual deficits: Secondary | ICD-10-CM | POA: Diagnosis not present

## 2019-10-21 DIAGNOSIS — I10 Essential (primary) hypertension: Secondary | ICD-10-CM | POA: Diagnosis not present

## 2019-10-21 DIAGNOSIS — Z794 Long term (current) use of insulin: Secondary | ICD-10-CM | POA: Diagnosis not present

## 2019-10-21 MED ORDER — CARVEDILOL 3.125 MG PO TABS: 3.1250 mg | ORAL_TABLET | Freq: Two times a day (BID) | ORAL | 1 refills | Status: DC

## 2019-10-21 NOTE — Assessment & Plan Note (Signed)
B/P has actually been running a little low.  I would leave her off Losartan and try Coreg 3.125 mg BID

## 2019-10-21 NOTE — Assessment & Plan Note (Signed)
CABG x 3 March of 2007 LIMA-LAD, LtRA-RI, SVG-OM1, SVG-PDA  She is s/p PCI with DES to CFX & OM 01 Aug 2006.  Cath 2016- all SVGs occluded, CFX and OM stents occluded, patent LIMA-LAD- with Lt-Lt collaterals-medical Rx The patient has not had recent angina

## 2019-10-21 NOTE — Telephone Encounter (Signed)
Returned call to [employer] with [facility] left message on personal voice mail to fax lab results to [phone].

## 2019-10-21 NOTE — Assessment & Plan Note (Signed)
EF 35% 2016 echo

## 2019-10-21 NOTE — Patient Instructions (Signed)
Medication Instructions:   STOP TAKING LOSARTAN   START TAKING  Carvedilol 3.125 MG TWICE A DAY   *If you need a refill on your cardiac medications before your next appointment, please call your pharmacy*  Lab Work:  REQUESTING  [place]   If you have labs (blood work) drawn today and your tests are completely normal, you will receive your results only by: [name] MyChart Message (if you have MyChart) OR . A paper copy in the mail If you have any lab test that is abnormal or we need to change your treatment, we will call you to review the results.  Testing/Procedures: NONE ORDERED  TODAY    Follow-Up: At [facility], you and your health needs are our priority.  As part of our continuing mission to provide you with exceptional heart care, we have created designated Provider Care Teams.  These Care Teams include your primary Cardiologist (physician) and Advanced Practice Providers (APPs -  Physician Assistants and Nurse Practitioners) who all work together to provide you with the care you need, when you need it.  Your next appointment:   AS SCHEDULED   The format for your next appointment:   In Person  Provider:   You may see Peter Martinique, MD   Other Instructions

## 2019-10-21 NOTE — Telephone Encounter (Signed)
Almyra Free, RN with [facility], is calling to inform Dr. Martinique that patient's lab results are being faxed over to the office.

## 2019-10-21 NOTE — Assessment & Plan Note (Signed)
Her Lasix was increased to 40 mg BID a couple of months ago and this helped though she still has some LE edema.   BMP was drawn last week by [facility]- we are looking for results

## 2019-10-21 NOTE — Progress Notes (Signed)
Cardiology Office Note:    Date:  10/21/2019   ID:  Diane [name], DOB [birth year], MRN [id number]  PCP:  [name], MD  Cardiologist:  Peter Martinique, MD  Electrophysiologist:  None   Referring MD: [name], MD   No chief complaint on file.   History of Present Illness:    Diane [name] is a remarkable 64 y.o. female with a hx of extensive coronary disease.  She had bypass grafting in 2007, later that same year she had a catheterization for unstable angina and all her grafts were occluded except for the LIMA to LAD.  She underwent circumflex and OM PCI with DES.  In 2016 she had an abnormal Myoview as a preop clearance.  She subsequently underwent catheterization.  This revealed occlusion of the previously placed circumflex and OM stents.  She had left to left collaterals and left-to-right collaterals.  Her ejection fraction was 25 to 35%, she also had moderate pulmonary hypertension.  She has been treated medically and most recently has done remarkably well.  In addition to the above she has extensive vascular disease with right internal carotid artery stenting and January 2019.  She has had prior stroke by MRI.  Carotid Dopplers done in July 2020 showed a 60 to 79% left internal carotid artery stenosis, Dr. Gwenlyn Found is following this.  In addition to the above she has essential hypertension, non-insulin-dependent diabetes, dyslipidemia, seizure disorder, and history of sleep apnea with intolerance to CPAP.   In December 2020 she had some lower extremity edema and her furosemide was increased to 40 mg twice daily.  She has had some issues with symptomatic orthostatic hypotension and her carvedilol has been decreased by Dr. Martinique.  Patient seen today in follow-up.  She was seen by home health nurse last week.  She was supposed to have had a BM P after her Lasix was increased but this was never done.  The patient tells me it was drawn by the home health nurse last week although these  results are not in the computer.  Patient also informed me that she has stopped taking her carvedilol and losartan altogether.  She says she is doing better.  Her blood pressure at home runs 110/80, I confirm that in the office today in both arms.  She is not had angina.  She has chronic dyspnea on exertion but no orthopnea.  She has some lower extremity edema left greater than right I would describe this as trace edema.    Past Medical History:  Diagnosis Date  . Anemia   . Carotid stenosis    a. s/p right carotid stent 10/2017.  [name] Chronic systolic CHF (congestive heart failure) ([place])   . Colon polyps   . COPD (chronic obstructive pulmonary disease) ([place])   . Coronary artery disease    post CABG in 3/07 , coronary stents   . Diabetes mellitus    type 2  . Dyslipidemia   . Fibromyalgia   . GERD (gastroesophageal reflux disease)   . Headache    hx of   . Hyperlipidemia   . Hypertension   . Myocardial infarction ([place])   . Pneumonia    hx of   . Seizures ([place])    last seizure- 03/2013   . Shortness of breath dyspnea    with exertion or when fluid builds up   . Sleep apnea    used to wear a cpap- not used in 3 years   .  Status post dilation of esophageal narrowing   . Stroke [facility]) 1993   problems with balance   . Systolic murmur    known mild AS and MR  . Thyroid goiter   . Tobacco abuse     Past Surgical History:  Procedure Laterality Date  . APPENDECTOMY    . BIOPSY  02/03/2019   Procedure: BIOPSY;  Surgeon: Ladene Artist, MD;  Location: [facility];  Service: Endoscopy;;  . BREAST BIOPSY Left 2016  . CARDIAC CATHETERIZATION  11/29/05   EF of 55%  . CARDIAC CATHETERIZATION  08/06/06   EF of 45-50%  . CARDIAC CATHETERIZATION N/A 05/06/2015   Procedure: Right/Left Heart Cath and Coronary/Graft Angiography;  Surgeon: Peter M Martinique, MD;  Location: [place] [facility];  Service: Cardiovascular;  Laterality: N/A;  . CAROTID PTA/STENT INTERVENTION N/A 10/10/2017   Procedure:  CAROTID PTA/STENT INTERVENTION - Right;  Surgeon: Serafina Mitchell, MD;  Location: [place] CV LAB;  Service: Cardiovascular;  Laterality: N/A;  . CERVICAL FUSION  1990  . CHOLECYSTECTOMY    . COLON RESECTION     mass removed and 4 in of colon  . COLONOSCOPY WITH PROPOFOL N/A 07/16/2017   Procedure: COLONOSCOPY WITH PROPOFOL;  Surgeon: Ladene Artist, MD;  Location: [facility];  Service: Endoscopy;  Laterality: N/A;  . CORONARY ARTERY BYPASS GRAFT  12/04/2005   x5 -- left internal mammary artery to the LAD, left radial artery to the ramus intermedius, saphenous vein graft to the obtuse marginal 1, sequential saphenous vein grat to the acute marginal and posterior descending, endoscopic vein harvesting from the left thigh with open vein harvest from right leg  . CORONARY STENT PLACEMENT  08/11/06   PCI of her ciurcumflex/OM vessel  . ESOPHAGOGASTRODUODENOSCOPY (EGD) WITH PROPOFOL N/A 02/03/2019   Procedure: ESOPHAGOGASTRODUODENOSCOPY (EGD) WITH PROPOFOL;  Surgeon: Ladene Artist, MD;  Location: [facility];  Service: Endoscopy;  Laterality: N/A;  . LAPAROTOMY Bilateral 05/19/2015   Procedure: EXPLORATORY LAPAROTOMY WITH BILATERAL SALPINGO OOPHORECTOMY /OMENTECTOMY/SEGMENTAL SIGMOID COLECTOMY ;  Surgeon: Everitt Amber, MD;  Location: [facility];  Service: Gynecology;  Laterality: Bilateral;  . PERIPHERAL VASCULAR CATHETERIZATION N/A 11/15/2015   Procedure: Carotid PTA/Stent Intervention;  Surgeon: Lorretta Harp, MD;  Location: [place] [facility];  Service: Cardiovascular;  Laterality: N/A;  . PERIPHERAL VASCULAR CATHETERIZATION  11/15/2015   Procedure: Carotid Angiography;  Surgeon: Lorretta Harp, MD;  Location: [place] CV LAB;  Service: Cardiovascular;;    Current Medications: Current Meds  Medication Sig  . acetaminophen (TYLENOL) 500 MG tablet Take 1,000 mg by mouth every 6 (six) hours as needed for moderate pain or headache.  . albuterol (VENTOLIN HFA) 108 (90 Base) MCG/ACT inhaler  INHALER 2 PUFFS EVERY 6 HOURS AS NEEDED FOR WHEEZING/SHORTNESS OF BREATH  . aspirin EC 81 MG tablet Take 81 mg by mouth every evening.   . carvedilol (COREG) 3.125 MG tablet Take 1 tablet (3.125 mg total) by mouth 2 (two) times daily with a meal.  . clopidogrel (PLAVIX) 75 MG tablet Take 1 tablet (75 mg total) by mouth daily.  [name] dexlansoprazole (DEXILANT) 60 MG capsule TAKE 1 CAPSULE BY MOUTH EVERY DAY  . diphenhydrAMINE (BENADRYL) 25 mg capsule Take 2 capsules in AM and 1 capsule at night (Patient taking differently: Take 50 mg by mouth 2 (two) times daily as needed for itching or allergies. )  . fluticasone (FLONASE) 50 MCG/ACT nasal spray Place 2 sprays into both nostrils daily.  . furosemide (LASIX)  40 MG tablet Take 1 tablet (40 mg total) by mouth 2 (two) times daily.  [name] gabapentin (NEURONTIN) 300 MG capsule TAKE 2 CAPSULES BY MOUTH AT BEDTIME  . guaiFENesin (MUCINEX) 600 MG 12 hr tablet Take 2 tablets (1,200 mg total) by mouth 2 (two) times daily.  . iron polysaccharides (NU-IRON) 150 MG capsule Take 1 capsule (150 mg total) by mouth 2 (two) times daily.  . isosorbide mononitrate (IMDUR) 30 MG 24 hr tablet Take 1 tablet (30 mg total) by mouth daily.  [name] JANUVIA 50 MG tablet Take 50 mg by mouth daily.  [name] levETIRAcetam (KEPPRA) 1000 MG tablet TAKE 1 TABLET BY MOUTH TWICE A DAY  . metFORMIN (GLUCOPHAGE-XR) 500 MG 24 hr tablet Take 500 mg by mouth 2 (two) times daily.  . nicotine polacrilex (NICORETTE) 4 MG gum Take 1 each (4 mg total) by mouth as needed for smoking cessation.  . nitroGLYCERIN (NITROLINGUAL) 0.4 MG/SPRAY spray Place 1 spray under the tongue every 5 (five) minutes x 3 doses as needed for chest pain.  [name] nystatin (NYSTATIN) powder Apply topically 4 (four) times daily.  . ONE TOUCH ULTRA TEST test strip USE AS DIRECTED FOR TESTING BLOOD GLUCOSE 3 TIMES DAILY  . polyethylene glycol powder (GLYCOLAX/MIRALAX) powder TAKE 1 DOSE 17 GRAMS IN 8 OZ OF WATER DAILY AS NEEDED FOR CONSTIPATION.  (Patient taking differently: Take 17 g by mouth daily. Take 1 dose 17 grams in 8 oz of water daily for constipation.)  . potassium chloride (KLOR-CON M10) 10 MEQ tablet Take 1 tablet (10 mEq total) by mouth 2 (two) times daily.  . ranolazine (RANEXA) 1000 MG SR tablet Take 1 tablet (1,000 mg total) by mouth 2 (two) times daily.  . rosuvastatin (CRESTOR) 10 MG tablet TAKE 1 TABLET BY MOUTH EVERY DAY  . sodium chloride (OCEAN) 0.65 % SOLN nasal spray Place 1 spray into both nostrils as needed for congestion.  . vitamin B-12 (CYANOCOBALAMIN) 1000 MCG tablet Take 1,000 mcg by mouth daily.  . [DISCONTINUED] carvedilol (COREG) 12.5 MG tablet Take 1/2 tablet ( 6.25 mg ) twice a day  . [DISCONTINUED] carvedilol (COREG) 3.125 MG tablet Take 1 tablet (3.125 mg total) by mouth 2 (two) times daily with a meal.  . [DISCONTINUED] cetirizine (ZYRTEC) 10 MG tablet Take 10 mg by mouth daily as needed for allergies.  . [DISCONTINUED] losartan (COZAAR) 50 MG tablet Take 1 tablet (50 mg total) by mouth daily.     Allergies:   Bee venom, Entresto [sacubitril-valsartan], Tetracycline, and Ace inhibitors   Social History   Socioeconomic History  . Marital status: Divorced    Spouse name: Not on file  . Number of children: 0  . Years of education: Not on file  . Highest education level: Not on file  Occupational History  . Occupation: disabled  Tobacco Use  . Smoking status: Current Every Day Smoker    Packs/day: 1.00    Years: 51.00    Pack years: 51.00    Types: Cigarettes    Start date: 62  . Smokeless tobacco: Never Used  . Tobacco comment: 04/17/19 down to 1 pack her day--LCR  1.5 pack to 2 packs per day. Has been working on reducing the # of cigarettes she smokes, down to 1/2 PP7D  Substance and Sexual Activity  . Alcohol use: No  . Drug use: No  . Sexual activity: Not Currently    Partners: Male    Birth control/protection: None  Other Topics Concern  .  Not on file  Social History Narrative   . Not on file   Social Determinants of Health   Financial Resource Strain: Low Risk   . Difficulty of Paying Living Expenses: Not hard at all  Food Insecurity: No Food Insecurity  . Worried About Charity fundraiser in the Last Year: Never true  . Ran Out of Food in the Last Year: Never true  Transportation Needs: No Transportation Needs  . Lack of Transportation (Medical): No  . Lack of Transportation (Non-Medical): No  Physical Activity: Insufficiently Active  . Days of Exercise per Week: 2 days  . Minutes of Exercise per Session: 60 min  Stress: No Stress Concern Present  . Feeling of Stress : Not at all  Social Connections:   . Frequency of Communication with Friends and Family: Not on file  . Frequency of Social Gatherings with Friends and Family: Not on file  . Attends Religious Services: Not on file  . Active Member of Clubs or Organizations: Not on file  . Attends Archivist Meetings: Not on file  . Marital Status: Not on file     Family History: The patient's family history includes COPD in her mother; Cancer in her father, paternal uncle, and sister; Diabetes in her mother; Drug abuse in her paternal grandmother; Glaucoma in her sister; Heart attack in her father; Heart disease in her maternal aunt, maternal grandfather, and mother; Hyperlipidemia in her mother; Hypertension in her mother and sister; Lung cancer in her paternal aunt and paternal uncle; Melanoma in her paternal uncle; Stroke in her paternal grandfather.  ROS:   Please see the history of present illness.     All other systems reviewed and are negative.  EKGs/Labs/Other Studies Reviewed:    The following studies were reviewed today: Echo 04/2015- EF 35%  EKG:  EKG is ordered today.  The ekg ordered today demonstrates NSR- HR 94, AS Qs, PVCs, small inferior Qs  Recent Labs: 04/21/2019: ALT 10; BUN 16; Creatinine, Ser 0.80; Hemoglobin 11.3; Platelets 242.0; Potassium 5.1; Sodium 135; TSH 1.80   Recent Lipid Panel    Component Value Date/Time   CHOL 139 04/21/2019 1104   TRIG 163.0 (H) 04/21/2019 1104   TRIG 182 (H) 10/10/2006 1254   HDL 45.10 04/21/2019 1104   CHOLHDL 3 04/21/2019 1104   VLDL 32.6 04/21/2019 1104   LDLCALC 61 04/21/2019 1104   LDLDIRECT 76.0 04/11/2017 1408    Physical Exam:    VS:  BP 110/64   Pulse 94   Ht 4\' 9"  (1.448 m)   Wt 146 lb (66.2 kg)   LMP 09/02/2003   BMI 31.59 kg/m     Wt Readings from Last 3 Encounters:  10/21/19 146 lb (66.2 kg)  07/21/19 140 lb 6.4 oz (63.7 kg)  04/23/19 143 lb 5 oz (65 kg)     GEN:  Well nourished, well developed in no acute distress HEENT: Normal NECK: No JVD; LCA bruit LYMPHATICS: No lymphadenopathy CARDIAC: RRR, 2/6 systolic murmur AOV, preserved S2, no rubs, gallops RESPIRATORY:  Clear to auscultation without rales, wheezing or rhonchi  ABDOMEN: Soft, non-tender, non-distended MUSCULOSKELETAL:  Trace LE edema, Lt > Rt No deformity  SKIN: Warm and dry NEUROLOGIC:  Alert and oriented x 3 PSYCHIATRIC:  Normal affect   ASSESSMENT:    Hx of CABG CABG x 3 March of 2007 LIMA-LAD, LtRA-RI, SVG-OM1, SVG-PDA  She is s/p PCI with DES to CFX & OM 01 Aug 2006.  Cath 2016-  all SVGs occluded, CFX and OM stents occluded, patent LIMA-LAD- with Lt-Lt collaterals-medical Rx The patient has not had recent angina  Chronic systolic congestive heart failure (HCC) Her Lasix was increased to 40 mg BID a couple of months ago and this helped though she still has some LE edema.   BMP was drawn last week by [facility]- we are looking for results  Ischemic cardiomyopathy EF 35% 2016 echo  Essential hypertension B/P has actually been running a little low.  I would leave her off Losartan and try Coreg 3.125 mg BID  PLAN:    Try Coreg 3.125 mg BID.  I will try and locate her BMP from last week.  Continue Lasix 40 mg BID for now.  Keep f/u with Dr Martinique as scheduled.    Medication Adjustments/Labs and Tests Ordered: Current  medicines are reviewed at length with the patient today.  Concerns regarding medicines are outlined above.  No orders of the defined types were placed in this encounter.  Meds ordered this encounter  Medications  . DISCONTD: carvedilol (COREG) 3.125 MG tablet    Sig: Take 1 tablet (3.125 mg total) by mouth 2 (two) times daily with a meal.    Dispense:  60 tablet    Refill:  1  . carvedilol (COREG) 3.125 MG tablet    Sig: Take 1 tablet (3.125 mg total) by mouth 2 (two) times daily with a meal.    Dispense:  180 tablet    Refill:  1    Patient Instructions  Medication Instructions:   STOP TAKING LOSARTAN   START TAKING  Carvedilol 3.125 MG TWICE A DAY   *If you need a refill on your cardiac medications before your next appointment, please call your pharmacy*  Lab Work:  REQUESTING  [place]   If you have labs (blood work) drawn today and your tests are completely normal, you will receive your results only by: [name] MyChart Message (if you have MyChart) OR . A paper copy in the mail If you have any lab test that is abnormal or we need to change your treatment, we will call you to review the results.  Testing/Procedures: NONE ORDERED  TODAY    Follow-Up: At [facility], you and your health needs are our priority.  As part of our continuing mission to provide you with exceptional heart care, we have created designated Provider Care Teams.  These Care Teams include your primary Cardiologist (physician) and Advanced Practice Providers (APPs -  Physician Assistants and Nurse Practitioners) who all work together to provide you with the care you need, when you need it.  Your next appointment:   AS SCHEDULED   The format for your next appointment:   In Person  Provider:   You may see Peter Martinique, MD   Other Instructions      Signed, Kerin Ransom, PA-C  10/21/2019 2:07 PM    [facility]

## 2019-10-23 NOTE — Telephone Encounter (Signed)
I placed call to Almyra Free and left message to call back.  I called patient who reports she started Coreg yesterday. BP was 120/80.  Today she took Coreg about 11:00. Prior to taking this her BP was 115/78.  She was feeling fine at that time. She started feeling weak and as if she couldn't stand. Checked BP and it was 72/46. She lied down with feet elevated. Has had similar problems in the past.  She rechecked BP while on the phone with me and it is now 100/64. She is feeling a little better and is now able to stand up without problems. I advised her to stop Coreg and to stay hydrated.  I told her if she feels as if she will pass out she should call EMS In the meantime Almyra Free called back. I returned call and gave her update on patient. I told her we would look for lab work in office. Will forward to Dr Martinique for review/recommendations

## 2019-10-23 NOTE — Telephone Encounter (Signed)
Almyra Free, RN with [facility], is calling to follow up in regards to whether the office has received labs that were faxed to the office or not. Please call to confirm.

## 2019-10-23 NOTE — Telephone Encounter (Signed)
Agree  Janyla Biscoe MD, FACC   

## 2019-10-23 NOTE — Telephone Encounter (Signed)
Diane [name] from [facility] calling back to add that she called the patient and she told her Dr. Martinique put her on carvedilol (COREG) 3.125 MG tablet. She states the patient took it yesterday and was okay, but today her BP went down to 72/46. She says the patient is laying down with her feet elevated.

## 2019-10-26 NOTE — Addendum Note (Signed)
Addended by: Therisa Doyne on: 10/26/2019 05:19 PM   Modules accepted: Orders

## 2019-10-27 NOTE — Telephone Encounter (Signed)
Left message on Diane [name] with [facility] personal voice mail we never received lab.Advised to refax lab results to fax # [phone].

## 2019-10-27 NOTE — Telephone Encounter (Signed)
Will route to Dr.Jordan's nurse to see about lab work. Thank you!

## 2019-10-28 NOTE — Telephone Encounter (Signed)
Left message on Maurine Minister with [facility] personal voice mail received recent lab you faxed on patient.Dr.Jordan out of office this week.Dr.Jordan will review next week.

## 2019-10-29 NOTE — Telephone Encounter (Signed)
Routing to Ramos.

## 2019-10-29 NOTE — Telephone Encounter (Signed)
Follow Up  Almyra Free RN calling back in to follow up about patient. Patient's weight is at 147 and patient has some bilateral edema as well as SOB on exertion. Almyra Free wants to know should dieretic be altered. Please call back to discuss.

## 2019-10-29 NOTE — Telephone Encounter (Signed)
Returned call to [employer] with [facility] no answer.[place].  Spoke to patient she stated she has been sob for the past several weeks.Increased swelling in both lower legs.Stated she has gained 8 lbs within the last 2 weeks.Advised to double lasix dose for the next 3 days only then return to normal dose.Appointment scheduled with Jory Sims DNP 11/03/19 at 3:15 pm.Advised to see PCP tomorrow regarding recent lab that was done by [facility]- Hgb 8.2.

## 2019-11-02 ENCOUNTER — Ambulatory Visit: Payer: Medicare Other | Admitting: Neurology

## 2019-11-02 ENCOUNTER — Ambulatory Visit (INDEPENDENT_AMBULATORY_CARE_PROVIDER_SITE_OTHER): Payer: Medicare Other | Admitting: Family Medicine

## 2019-11-02 ENCOUNTER — Other Ambulatory Visit: Payer: Self-pay

## 2019-11-02 ENCOUNTER — Encounter: Payer: Self-pay | Admitting: Family Medicine

## 2019-11-02 VITALS — BP 124/66 | HR 99 | Temp 96.9°F | Ht <= 58 in | Wt 139.3 lb

## 2019-11-02 DIAGNOSIS — J410 Simple chronic bronchitis: Secondary | ICD-10-CM

## 2019-11-02 DIAGNOSIS — J9611 Chronic respiratory failure with hypoxia: Secondary | ICD-10-CM | POA: Diagnosis not present

## 2019-11-02 DIAGNOSIS — I739 Peripheral vascular disease, unspecified: Secondary | ICD-10-CM | POA: Diagnosis not present

## 2019-11-02 DIAGNOSIS — E538 Deficiency of other specified B group vitamins: Secondary | ICD-10-CM | POA: Diagnosis not present

## 2019-11-02 DIAGNOSIS — G40009 Localization-related (focal) (partial) idiopathic epilepsy and epileptic syndromes with seizures of localized onset, not intractable, without status epilepticus: Secondary | ICD-10-CM | POA: Diagnosis not present

## 2019-11-02 DIAGNOSIS — I255 Ischemic cardiomyopathy: Secondary | ICD-10-CM | POA: Diagnosis not present

## 2019-11-02 DIAGNOSIS — D509 Iron deficiency anemia, unspecified: Secondary | ICD-10-CM | POA: Diagnosis not present

## 2019-11-02 DIAGNOSIS — F17200 Nicotine dependence, unspecified, uncomplicated: Secondary | ICD-10-CM | POA: Diagnosis not present

## 2019-11-02 DIAGNOSIS — I25708 Atherosclerosis of coronary artery bypass graft(s), unspecified, with other forms of angina pectoris: Secondary | ICD-10-CM | POA: Insufficient documentation

## 2019-11-02 NOTE — Assessment & Plan Note (Signed)
In the setting of iron def as well  B12 level today  1000 mcg daily is on med list

## 2019-11-02 NOTE — Assessment & Plan Note (Signed)
Continues 02 at adv of pulmonary  Has cut down to 1/2 ppd smoking  Enc her to keep working on it

## 2019-11-02 NOTE — Progress Notes (Signed)
Subjective:    Patient ID: Diane [name], female    DOB: Mar 07, 1956, 64 y.o.   MRN: [id number]  HPI Pt presents to discuss labs from cardiology   Wt Readings from Last 3 Encounters:  11/02/19 139 lb 5 oz (63.2 kg)  10/21/19 146 lb (66.2 kg)  07/21/19 140 lb 6.4 oz (63.7 kg)   30.15 kg/m    recently drawn 10/15/19  Cbc was abnormal  Wbc 4.5 Hb 8.2 HCT 29.1 MCV 71.7 (low) Platelet ct 200   Glucose was high at 257  Sodium low at 133   No abdominal pain or other symptoms  She gets constipated if she does not take miralax  No blood noted in her stool  Has black stool from the iron it takes  Still takes dexilant as well    Eating better- spinich/kale/greens and some fruit in moderation   Her mother had to take IV iron due to not abs iron    Pt has had iron def in the past  Last HCT here was 35.9 with ferritin of 5.2 Has taken nu iron 150 mg bid (still taking it)   H/o L hemicolectomy for b9 mass in the past   She is feeling tired from the anemia  Also with her CHF- an effort to do anything     Lab Results  Component Value Date   WBC 5.2 04/21/2019   HGB 11.3 (L) 04/21/2019   HCT 35.9 (L) 04/21/2019   MCV 75.0 (L) 04/21/2019   PLT 242.0 04/21/2019   She takes plavix for vascular issues   Lab Results  Component Value Date   CREATININE 0.80 04/21/2019   BUN 16 04/21/2019   NA 135 04/21/2019   K 5.1 04/21/2019   CL 101 04/21/2019   CO2 27 04/21/2019   Lab Results  Component Value Date   VITAMINB12 257 04/21/2019    Diabetes 2 Sugar control has been ok (staying under 200 for the most part)  Was formerly on [place]     EGD 5/20- erythematous mucosa in antrum Colonoscopy report rev 2018- nl colon and anastomosis   Last CT abd/pelvis was 5/20  CT Abdomen Pelvis W Contrast (Accession [id number]) (Order [id number]) Imaging Date: 02/11/2019 Department: [place] Released By: Howie Ill Authorizing: Ladene Artist, MD  Exam Status  Status  Final [99]  PACS Intelerad Image Link  Show images for CT Abdomen Pelvis W Contrast  Study Result  CLINICAL DATA:  Right abdominal pain, anemia, blood in stool. Prior cholecystectomy, appendectomy, and colon resection.  EXAM: CT ABDOMEN AND PELVIS WITH CONTRAST  TECHNIQUE: Multidetector CT imaging of the abdomen and pelvis was performed using the standard protocol following bolus administration of intravenous contrast.  CONTRAST:  146m OMNIPAQUE IOHEXOL 300 MG/ML  SOLN  COMPARISON:  CT abdomen/pelvis dated 02/18/2017  FINDINGS: Lower chest: Lung bases are clear. Stable fusiform aneurysm versus focal penetrating ulcer along the lateral descending thoracic aorta (series 2/image 7), unchanged.  Hepatobiliary: Liver is within normal limits.  Status post cholecystectomy. No intrahepatic or extrahepatic ductal dilatation.  Pancreas: Within normal limits.  Spleen: Within normal limits.  Adrenals/Urinary Tract: Thickening of the bilateral adrenal glands, without discrete mass.  Kidneys are within normal limits.  No hydronephrosis.  Thick-walled bladder, although underdistended.  Stomach/Bowel: Stomach is within normal limits.  No evidence of bowel obstruction.  Prior appendectomy.  Prior left hemicolectomy with suture line in the left lower pelvis (series 2/image 65).  No colonic wall thickening or mass is evident on CT.  Vascular/Lymphatic: No evidence of abdominal aortic aneurysm. Mild fusiform ectasia of the infrarenal abdominal aorta measuring 2.9 cm (series 2/image 37). Atherosclerotic calcifications of the abdominal aorta and branch vessels.  No suspicious abdominopelvic lymphadenopathy.  Reproductive: Uterus is within normal limits.  No adnexal masses.  Other: No abdominopelvic ascites.  Mild diastasis of the right paramidline anterior abdominal wall (series 2/image  47).  Musculoskeletal: Mild degenerative changes of the visualized thoracolumbar spine.  IMPRESSION: Status post left hemicolectomy. No colonic wall thickening or mass is evident on CT.  No evidence of bowel obstruction.  Prior appendectomy.  No CT findings to account for the patient's abdominal pain.  Additional stable ancillary findings as above.   Electronically Signed   By: Julian Hy M.D.   On: 02/11/2019 19:35    Has cut down to 1/2ppd for smoking  Proud of that  Not ready to set a quit date   Patient Active Problem List   Diagnosis Date Noted  . Coronary artery disease of bypass graft of native heart with stable angina pectoris ([place]) 11/02/2019  . Visit for routine gyn exam 04/23/2019  . Generalized weakness 03/24/2019  . Occult blood in stools   . Positive colorectal cancer screening using DNA-based stool test 12/22/2018  . Anemia 12/22/2018  . Fatigue 12/05/2018  . Estrogen deficiency 04/21/2018  . Screening mammogram, encounter for 04/21/2018  . Osteopenia 04/21/2018  . History of HPV infection 04/21/2018  . Dizzy spells 03/03/2018  . Ischemic cardiomyopathy 01/21/2018  . Other constipation 05/07/2017  . Colon cancer screening 04/19/2017  . Incisional hernia 01/14/2017  . Smokers' cough ([place]) 08/13/2016  . Chronic respiratory failure ([place]) 07/11/2016  . Sleep-related hypoventilation due to lower airway obstruction 05/20/2016  . Periodic limb movements of sleep 05/20/2016  . Chronic systolic congestive heart failure ([place]) 05/08/2016  . Type 2 diabetes mellitus with diabetic polyneuropathy, with long-term current use of insulin ([place]) 12/13/2015  . B12 deficiency 12/01/2015  . History of CVA (cerebrovascular accident) 11/29/2015  . Abnormal nuclear stress test 05/06/2015  . 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 of localized onset, not intractable, without status epilepticus ([place]) 03/22/2015  . Cervical  disc disorder with radiculopathy of cervical region 03/22/2015  . Complex partial seizures ([place]) 09/16/2013  . Encounter for Medicare annual wellness exam 06/16/2013  . Antiplatelet or antithrombotic long-term use 06/08/2013  . Seizures ([place]) 03/25/2013  . Goiter 09/01/2012  . Other screening mammogram 06/13/2012  . Allergic rhinitis 06/13/2012  . Carotid artery disease ([place]) 08/20/2011  . Obstructive sleep apnea 09/18/2007  . TOBACCO USE 08/08/2007  . Hyperlipidemia 03/26/2007  . ANXIETY 03/26/2007  . DEPRESSION 03/26/2007  . MIGRAINE HEADACHE 03/26/2007  . CARPAL TUNNEL SYNDROME, BILATERAL 03/26/2007  . Essential hypertension 03/26/2007  . Hx of CABG 03/26/2007  . GERD 03/26/2007  . Claudication in peripheral vascular disease ([place]) 03/26/2007   Past Medical History:  Diagnosis Date  . Anemia   . Carotid stenosis    a. s/p right carotid stent 10/2017.  [name] Chronic systolic CHF (congestive heart failure) ([place])   . Colon polyps   . COPD (chronic obstructive pulmonary disease) ([place])   . Coronary artery disease    post CABG in 3/07 , coronary stents   . Diabetes mellitus    type 2  . Dyslipidemia   . Fibromyalgia   . GERD (gastroesophageal reflux disease)   . Headache    hx of   .  Hyperlipidemia   . Hypertension   . Myocardial infarction ([place])   . Pneumonia    hx of   . Seizures ([place])    last seizure- 03/2013   . Shortness of breath dyspnea    with exertion or when fluid builds up   . Sleep apnea    used to wear a cpap- not used in 3 years   . Status post dilation of esophageal narrowing   . Stroke [facility]) 1993   problems with balance   . Systolic murmur    known mild AS and MR  . Thyroid goiter   . Tobacco abuse    Past Surgical History:  Procedure Laterality Date  . APPENDECTOMY    . BIOPSY  02/03/2019   Procedure: BIOPSY;  Surgeon: Ladene Artist, MD;  Location: [facility];  Service: Endoscopy;;  . BREAST BIOPSY Left 2016  . CARDIAC CATHETERIZATION  11/29/05    EF of 55%  . CARDIAC CATHETERIZATION  08/06/06   EF of 45-50%  . CARDIAC CATHETERIZATION N/A 05/06/2015   Procedure: Right/Left Heart Cath and Coronary/Graft Angiography;  Surgeon: Peter M Martinique, MD;  Location: [place] [facility];  Service: Cardiovascular;  Laterality: N/A;  . CAROTID PTA/STENT INTERVENTION N/A 10/10/2017   Procedure: CAROTID PTA/STENT INTERVENTION - Right;  Surgeon: Serafina Mitchell, MD;  Location: [place] CV LAB;  Service: Cardiovascular;  Laterality: N/A;  . CERVICAL FUSION  1990  . CHOLECYSTECTOMY    . COLON RESECTION     mass removed and 4 in of colon  . COLONOSCOPY WITH PROPOFOL N/A 07/16/2017   Procedure: COLONOSCOPY WITH PROPOFOL;  Surgeon: Ladene Artist, MD;  Location: [facility];  Service: Endoscopy;  Laterality: N/A;  . CORONARY ARTERY BYPASS GRAFT  12/04/2005   x5 -- left internal mammary artery to the LAD, left radial artery to the ramus intermedius, saphenous vein graft to the obtuse marginal 1, sequential saphenous vein grat to the acute marginal and posterior descending, endoscopic vein harvesting from the left thigh with open vein harvest from right leg  . CORONARY STENT PLACEMENT  08/11/06   PCI of her ciurcumflex/OM vessel  . ESOPHAGOGASTRODUODENOSCOPY (EGD) WITH PROPOFOL N/A 02/03/2019   Procedure: ESOPHAGOGASTRODUODENOSCOPY (EGD) WITH PROPOFOL;  Surgeon: Ladene Artist, MD;  Location: [facility];  Service: Endoscopy;  Laterality: N/A;  . LAPAROTOMY Bilateral 05/19/2015   Procedure: EXPLORATORY LAPAROTOMY WITH BILATERAL SALPINGO OOPHORECTOMY /OMENTECTOMY/SEGMENTAL SIGMOID COLECTOMY ;  Surgeon: Everitt Amber, MD;  Location: [facility];  Service: Gynecology;  Laterality: Bilateral;  . PERIPHERAL VASCULAR CATHETERIZATION N/A 11/15/2015   Procedure: Carotid PTA/Stent Intervention;  Surgeon: Lorretta Harp, MD;  Location: [place] [facility];  Service: Cardiovascular;  Laterality: N/A;  . PERIPHERAL VASCULAR CATHETERIZATION  11/15/2015   Procedure: Carotid  Angiography;  Surgeon: Lorretta Harp, MD;  Location: [place] [facility];  Service: Cardiovascular;;   Social History   Tobacco Use  . Smoking status: Current Every Day Smoker    Packs/day: 1.00    Years: 51.00    Pack years: 51.00    Types: Cigarettes    Start date: 30  . Smokeless tobacco: Never Used  . Tobacco comment: 04/17/19 down to 1 pack her day--LCR  1.5 pack to 2 packs per day. Has been working on reducing the # of cigarettes she smokes, down to 1/2 PP7D  Substance Use Topics  . Alcohol use: No  . Drug use: No   Family History  Problem Relation Age of Onset  .  Heart disease Mother   . Diabetes Mother   . COPD Mother   . Hyperlipidemia Mother   . Hypertension Mother   . Cancer Father        met, origin unknown  . Heart attack Father   . Drug abuse Paternal Grandmother   . Stroke Paternal Grandfather   . Lung cancer Paternal Aunt        lung with mets to brain  . Melanoma Paternal Uncle   . Lung cancer Paternal Uncle        lung/liver to brain  . Cancer Paternal Uncle        cancer of unknown type  . Heart disease Maternal Grandfather   . Hypertension Sister   . Cancer Sister        eyelid  . Glaucoma Sister   . Heart disease Maternal Aunt        x 2 aunts   Allergies  Allergen Reactions  . Bee Venom Itching and Swelling  . Entresto [Sacubitril-Valsartan] Cough  . Tetracycline     Unknown   . Ace Inhibitors Cough   Current Outpatient Medications on File Prior to Visit  Medication Sig Dispense Refill  . acetaminophen (TYLENOL) 500 MG tablet Take 1,000 mg by mouth every 6 (six) hours as needed for moderate pain or headache.    . albuterol (VENTOLIN HFA) 108 (90 Base) MCG/ACT inhaler INHALER 2 PUFFS EVERY 6 HOURS AS NEEDED FOR WHEEZING/SHORTNESS OF BREATH 1 Inhaler 6  . aspirin EC 81 MG tablet Take 81 mg by mouth every evening.  30 tablet 6  . clopidogrel (PLAVIX) 75 MG tablet Take 1 tablet (75 mg total) by mouth daily. 90 tablet 3  . dexlansoprazole  (DEXILANT) 60 MG capsule TAKE 1 CAPSULE BY MOUTH EVERY DAY 90 capsule 3  . diphenhydrAMINE (BENADRYL) 25 mg capsule Take 2 capsules in AM and 1 capsule at night (Patient taking differently: Take 50 mg by mouth 2 (two) times daily as needed for itching or allergies. ) 270 capsule 3  . fluticasone (FLONASE) 50 MCG/ACT nasal spray Place 2 sprays into both nostrils daily. 16 g 11  . furosemide (LASIX) 40 MG tablet Take 1 tablet (40 mg total) by mouth 2 (two) times daily. 180 tablet 3  . gabapentin (NEURONTIN) 300 MG capsule TAKE 2 CAPSULES BY MOUTH AT BEDTIME 180 capsule 1  . glimepiride (AMARYL) 2 MG tablet Take 2 mg by mouth daily. Only in the am    . glimepiride (AMARYL) 4 MG tablet Take 4 mg by mouth daily. Only in the pm    . guaiFENesin (MUCINEX) 600 MG 12 hr tablet Take 2 tablets (1,200 mg total) by mouth 2 (two) times daily. 360 tablet 3  . iron polysaccharides (NU-IRON) 150 MG capsule Take 1 capsule (150 mg total) by mouth 2 (two) times daily. 180 capsule 3  . isosorbide mononitrate (IMDUR) 30 MG 24 hr tablet Take 1 tablet (30 mg total) by mouth daily. 90 tablet 3  . levETIRAcetam (KEPPRA) 1000 MG tablet TAKE 1 TABLET BY MOUTH TWICE A DAY 180 tablet 3  . metFORMIN (GLUCOPHAGE-XR) 500 MG 24 hr tablet Take 500 mg by mouth 2 (two) times daily.  6  . nicotine polacrilex (NICORETTE) 4 MG gum Take 1 each (4 mg total) by mouth as needed for smoking cessation. 90 tablet 2  . nitroGLYCERIN (NITROLINGUAL) 0.4 MG/SPRAY spray Place 1 spray under the tongue every 5 (five) minutes x 3 doses as needed for chest pain.  12 g 3  . nystatin (NYSTATIN) powder Apply topically 4 (four) times daily. 15 g 3  . ONE TOUCH ULTRA TEST test strip USE AS DIRECTED FOR TESTING BLOOD GLUCOSE 3 TIMES DAILY  1  . polyethylene glycol powder (GLYCOLAX/MIRALAX) powder TAKE 1 DOSE 17 GRAMS IN 8 OZ OF WATER DAILY AS NEEDED FOR CONSTIPATION. (Patient taking differently: Take 17 g by mouth daily. Take 1 dose 17 grams in 8 oz of water  daily for constipation.) 527 g 3  . potassium chloride (KLOR-CON M10) 10 MEQ tablet Take 1 tablet (10 mEq total) by mouth 2 (two) times daily. 180 tablet 3  . ranolazine (RANEXA) 1000 MG SR tablet Take 1 tablet (1,000 mg total) by mouth 2 (two) times daily. 180 tablet 3  . rosuvastatin (CRESTOR) 10 MG tablet TAKE 1 TABLET BY MOUTH EVERY DAY 90 tablet 1  . sodium chloride (OCEAN) 0.65 % SOLN nasal spray Place 1 spray into both nostrils as needed for congestion.    . vitamin B-12 (CYANOCOBALAMIN) 1000 MCG tablet Take 1,000 mcg by mouth daily.     No current facility-administered medications on file prior to visit.    Review of Systems  Constitutional: Positive for fatigue. Negative for activity change, appetite change, fever and unexpected weight change.  HENT: Negative for congestion, ear pain, rhinorrhea, sinus pressure and sore throat.   Eyes: Negative for pain, redness and visual disturbance.  Respiratory: Positive for shortness of breath. Negative for cough and wheezing.        Baseline sob on exertion  02 dependent  Cardiovascular: Positive for leg swelling. Negative for chest pain and palpitations.  Gastrointestinal: Negative for abdominal distention, abdominal pain, anal bleeding, blood in stool, constipation, diarrhea, nausea, rectal pain and vomiting.  Endocrine: Negative for polydipsia and polyuria.  Genitourinary: Negative for dysuria, frequency, hematuria and urgency.  Musculoskeletal: Negative for arthralgias, back pain and myalgias.  Skin: Negative for pallor and rash.  Allergic/Immunologic: Negative for environmental allergies.  Neurological: Negative for dizziness, syncope and headaches.  Hematological: Negative for adenopathy. Does not bruise/bleed easily.  Psychiatric/Behavioral: Negative for decreased concentration and dysphoric mood. The patient is not nervous/anxious.        Objective:   Physical Exam Constitutional:      General: She is not in acute distress.     Appearance: Normal appearance. She is well-developed. She is obese. She is not ill-appearing or diaphoretic.  HENT:     Head: Normocephalic and atraumatic.     Mouth/Throat:     Mouth: Mucous membranes are moist.  Eyes:     General: No scleral icterus.    Conjunctiva/sclera: Conjunctivae normal.     Pupils: Pupils are equal, round, and reactive to light.  Neck:     Thyroid: No thyromegaly.     Vascular: No carotid bruit or JVD.  Cardiovascular:     Rate and Rhythm: Regular rhythm. Tachycardia present.     Heart sounds: Murmur present. No gallop.   Pulmonary:     Effort: Pulmonary effort is normal. No respiratory distress.     Breath sounds: Normal breath sounds. No wheezing or rales.     Comments: Diffusely distant bs  No wheezing  Some faint crackles at bases  Abdominal:     General: Bowel sounds are normal. There is no distension or abdominal bruit.     Palpations: Abdomen is soft. There is no mass.     Tenderness: There is no abdominal tenderness.  Musculoskeletal:  Cervical back: Normal range of motion and neck supple. No tenderness.     Right lower leg: Edema present.     Left lower leg: Edema present.     Comments: Trace pedal edema with sock line   Lymphadenopathy:     Cervical: No cervical adenopathy.  Skin:    General: Skin is warm and dry.     Findings: No rash.  Neurological:     Mental Status: She is alert.     Cranial Nerves: No cranial nerve deficit.     Coordination: Coordination normal.     Deep Tendon Reflexes: Reflexes are normal and symmetric. Reflexes normal.  Psychiatric:        Mood and Affect: Mood normal.     Comments: Mood is good  Pt seems fatigued but in good spirits           Assessment & Plan:   Problem List Items Addressed This Visit      Cardiovascular and Mediastinum   Coronary artery disease of bypass graft of native heart with stable angina pectoris ([place])    No current angina Pt is struggling with some CHF symptoms    Continues to work with cardiology        Respiratory   Chronic respiratory failure ([place])    Continues 02 at adv of pulmonary  Has cut down to 1/2 ppd smoking  Enc her to keep working on it       Smokers' cough ([place])    Enc pt to continue working on smoking cessation  Followed by pulmonary        Nervous and Auditory   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 of localized onset, not intractable, without status epilepticus ([place])    Continues keppra from neurology  No recent seizures        Other   TOBACCO USE    Disc in detail risks of smoking and possible outcomes including copd, vascular/ heart disease, cancer , respiratory and sinus infections  Pt voices understanding Pt has cut down to 1/2 ppd Does not want to set a quit date  Has significant COPD      Claudication in peripheral vascular disease ([place])    Vascular continues to follow No co today      B12 deficiency    In the setting of iron def as well  B12 level today  1000 mcg daily is on med list       Relevant Orders   Vitamin B12   Anemia - Primary    This is worse (cardiology labs- Hb 8.2 with low MCV) No GI symptoms  Rev last colonoscopy and EGD with pt  Also hospital w/u from 5/20 incl CT scan She takes niferex bid- initially with improvement  Dark stools are due to above  No abd pain or GI symptoms  Does take a ppi (dexilant) Renal labs were nl  Suspect not absorbing orally  Repeat cbc with iron studies today  Most likely will follow with hematology ref (may benefit from IV iron)          Relevant Orders   CBC with Differential/Platelet   Ferritin   IBC panel    Other Visit Diagnoses    Claudication ([place])   (Chronic)

## 2019-11-02 NOTE — Assessment & Plan Note (Signed)
Vascular continues to follow No co today

## 2019-11-02 NOTE — Assessment & Plan Note (Signed)
No current angina Pt is struggling with some CHF symptoms  Continues to work with cardiology

## 2019-11-02 NOTE — Progress Notes (Signed)
Cardiology Office Note   Date:  11/03/2019   ID:  KEYLY BALDONADO, DOB 15-Apr-1956, MRN [id number]  PCP:  [name], MD  Cardiologist:  Dr. Martinique  CC: Follow Up    History of Present Illness: Diane [name] is a 64 y.o. female who presents for ongoing assessment and management of extensive CAD, CABG 2007, with repeat cath same year in the setting of unstable angina, which revealed that all of her grafts were occluded with the exception of LIMA to LAD. She subsequently underwent PCI with DES to OM. In 2016, repeat myoview demonstrated new areas of ischemic prompting repeat cath with occlusion of the previously placed circumflex and OM stents.  She had left to left collaterals and left-to-right collaterals.  Her ejection fraction was 25 to 35%, she also had moderate pulmonary hypertension.  She has been treated medically.   Mrs Diane [name] also has history of extensive vascular disease with right carotid artery stenting 10/2017, with hx of prior CVA by MRI. Carotid Dopplers done in July 2020 showed a 60 to 79% left internal carotid artery stenosis, Dr. Gwenlyn Found is following this.  Additionally, she is being treated for HTN, NIDDM, dyslpidemia, seizure disorder, and OSA but does not tolerate CPAP.  She was seen last by Kerin Ransom, PA on 10/21/2019 at which time she was complaining of orthostatic symptoms, with decrease in carvedilol by Dr. Martinique, and lasix was increased to 40 mg BID. Follow up labs were scheduled to include BMET.Losartan was discontinued, and coreg was to be 3.125 mg BID.   [facility] called our office on 10/23/2019 to report hypotension on coreg dose with BP 72/46. She was advised to stop the coreg.  She was also experiencing LEE on follow up phone call dated 10/29/2019. Appt was made to be seen today she states she feels much better having stopped carvedilol.  She refuses any further doses.  She continues to have generalized fatigue and dyspnea on exertion.  She has been seen by her PCP and had  CBC drawn yesterday.  Hemoglobin was 8.8, hematocrit hematocrit 29.6.  Iron was low at 18, saturation 2.5, ferritin 6.3, transferrin 517.0.   O2 sats in the office today are 99%.  She is currently not short of breath at rest.  Her weight is up 3 pounds from yesterday's office visit with PCP.  She denies any melena, epistaxis, hemoptysis, or excessive bruising, on Plavix.  She currently uses a walker for ambulation.  Past Medical History:  Diagnosis Date  . Anemia   . Carotid stenosis    a. s/p right carotid stent 10/2017.  [name] Chronic systolic CHF (congestive heart failure) ([place])   . Colon polyps   . COPD (chronic obstructive pulmonary disease) ([place])   . Coronary artery disease    post CABG in 3/07 , coronary stents   . Diabetes mellitus    type 2  . Dyslipidemia   . Fibromyalgia   . GERD (gastroesophageal reflux disease)   . Headache    hx of   . Hyperlipidemia   . Hypertension   . Myocardial infarction ([place])   . Pneumonia    hx of   . Seizures ([place])    last seizure- 03/2013   . Shortness of breath dyspnea    with exertion or when fluid builds up   . Sleep apnea    used to wear a cpap- not used in 3 years   . Status post dilation of esophageal narrowing   . Stroke (  [place]) 1993   problems with balance   . Systolic murmur    known mild AS and MR  . Thyroid goiter   . Tobacco abuse     Past Surgical History:  Procedure Laterality Date  . APPENDECTOMY    . BIOPSY  02/03/2019   Procedure: BIOPSY;  Surgeon: Ladene Artist, MD;  Location: [facility];  Service: Endoscopy;;  . BREAST BIOPSY Left 2016  . CARDIAC CATHETERIZATION  11/29/05   EF of 55%  . CARDIAC CATHETERIZATION  08/06/06   EF of 45-50%  . CARDIAC CATHETERIZATION N/A 05/06/2015   Procedure: Right/Left Heart Cath and Coronary/Graft Angiography;  Surgeon: Peter M Martinique, MD;  Location: [facility];  Service: Cardiovascular;  Laterality: N/A;  . CAROTID PTA/STENT INTERVENTION N/A 10/10/2017   Procedure: CAROTID  PTA/STENT INTERVENTION - Right;  Surgeon: Serafina Mitchell, MD;  Location: [facility];  Service: Cardiovascular;  Laterality: N/A;  . CERVICAL FUSION  1990  . CHOLECYSTECTOMY    . COLON RESECTION     mass removed and 4 in of colon  . COLONOSCOPY WITH PROPOFOL N/A 07/16/2017   Procedure: COLONOSCOPY WITH PROPOFOL;  Surgeon: Ladene Artist, MD;  Location: [facility];  Service: Endoscopy;  Laterality: N/A;  . CORONARY ARTERY BYPASS GRAFT  12/04/2005   x5 -- left internal mammary artery to the LAD, left radial artery to the ramus intermedius, saphenous vein graft to the obtuse marginal 1, sequential saphenous vein grat to the acute marginal and posterior descending, endoscopic vein harvesting from the left thigh with open vein harvest from right leg  . CORONARY STENT PLACEMENT  08/11/06   PCI of her ciurcumflex/OM vessel  . ESOPHAGOGASTRODUODENOSCOPY (EGD) WITH PROPOFOL N/A 02/03/2019   Procedure: ESOPHAGOGASTRODUODENOSCOPY (EGD) WITH PROPOFOL;  Surgeon: Ladene Artist, MD;  Location: [facility];  Service: Endoscopy;  Laterality: N/A;  . LAPAROTOMY Bilateral 05/19/2015   Procedure: EXPLORATORY LAPAROTOMY WITH BILATERAL SALPINGO OOPHORECTOMY /OMENTECTOMY/SEGMENTAL SIGMOID COLECTOMY ;  Surgeon: Everitt Amber, MD;  Location: [facility];  Service: Gynecology;  Laterality: Bilateral;  . PERIPHERAL VASCULAR CATHETERIZATION N/A 11/15/2015   Procedure: Carotid PTA/Stent Intervention;  Surgeon: Lorretta Harp, MD;  Location: [facility];  Service: Cardiovascular;  Laterality: N/A;  . PERIPHERAL VASCULAR CATHETERIZATION  11/15/2015   Procedure: Carotid Angiography;  Surgeon: Lorretta Harp, MD;  Location: [facility];  Service: Cardiovascular;;     Current Outpatient Medications  Medication Sig Dispense Refill  . acetaminophen (TYLENOL) 500 MG tablet Take 1,000 mg by mouth every 6 (six) hours as needed for moderate pain or headache.    . albuterol (VENTOLIN HFA) 108 (90 Base) MCG/ACT  inhaler INHALER 2 PUFFS EVERY 6 HOURS AS NEEDED FOR WHEEZING/SHORTNESS OF BREATH 1 Inhaler 6  . aspirin EC 81 MG tablet Take 81 mg by mouth every evening.  30 tablet 6  . clopidogrel (PLAVIX) 75 MG tablet Take 1 tablet (75 mg total) by mouth daily. 90 tablet 3  . dexlansoprazole (DEXILANT) 60 MG capsule TAKE 1 CAPSULE BY MOUTH EVERY DAY 90 capsule 3  . diphenhydrAMINE (BENADRYL) 25 mg capsule Take 2 capsules in AM and 1 capsule at night (Patient taking differently: Take 50 mg by mouth 2 (two) times daily as needed for itching or allergies. ) 270 capsule 3  . fluticasone (FLONASE) 50 MCG/ACT nasal spray Place 2 sprays into both nostrils daily. 16 g 11  . furosemide (LASIX) 40 MG tablet Take 1 tablet (40 mg total) by  mouth 2 (two) times daily. 180 tablet 3  . gabapentin (NEURONTIN) 300 MG capsule TAKE 2 CAPSULES BY MOUTH AT BEDTIME 180 capsule 1  . glimepiride (AMARYL) 2 MG tablet Take 2 mg by mouth daily. Only in the am    . glimepiride (AMARYL) 4 MG tablet Take 4 mg by mouth daily. Only in the pm    . guaiFENesin (MUCINEX) 600 MG 12 hr tablet Take 2 tablets (1,200 mg total) by mouth 2 (two) times daily. 360 tablet 3  . iron polysaccharides (NU-IRON) 150 MG capsule Take 1 capsule (150 mg total) by mouth 2 (two) times daily. 180 capsule 3  . isosorbide mononitrate (IMDUR) 30 MG 24 hr tablet Take 1 tablet (30 mg total) by mouth daily. 90 tablet 3  . levETIRAcetam (KEPPRA) 1000 MG tablet TAKE 1 TABLET BY MOUTH TWICE A DAY 180 tablet 3  . metFORMIN (GLUCOPHAGE-XR) 500 MG 24 hr tablet Take 500 mg by mouth 2 (two) times daily.  6  . nicotine polacrilex (NICORETTE) 4 MG gum Take 1 each (4 mg total) by mouth as needed for smoking cessation. 90 tablet 2  . nitroGLYCERIN (NITROLINGUAL) 0.4 MG/SPRAY spray Place 1 spray under the tongue every 5 (five) minutes x 3 doses as needed for chest pain. 12 g 3  . nystatin (NYSTATIN) powder Apply topically 4 (four) times daily. 15 g 3  . ONE TOUCH ULTRA TEST test strip  USE AS DIRECTED FOR TESTING BLOOD GLUCOSE 3 TIMES DAILY  1  . polyethylene glycol powder (GLYCOLAX/MIRALAX) powder TAKE 1 DOSE 17 GRAMS IN 8 OZ OF WATER DAILY AS NEEDED FOR CONSTIPATION. (Patient taking differently: Take 17 g by mouth daily. Take 1 dose 17 grams in 8 oz of water daily for constipation.) 527 g 3  . potassium chloride (KLOR-CON M10) 10 MEQ tablet Take 1 tablet (10 mEq total) by mouth 2 (two) times daily. 180 tablet 3  . ranolazine (RANEXA) 1000 MG SR tablet Take 1 tablet (1,000 mg total) by mouth 2 (two) times daily. 180 tablet 3  . rosuvastatin (CRESTOR) 10 MG tablet TAKE 1 TABLET BY MOUTH EVERY DAY 90 tablet 1  . sodium chloride (OCEAN) 0.65 % SOLN nasal spray Place 1 spray into both nostrils as needed for congestion.    . vitamin B-12 (CYANOCOBALAMIN) 1000 MCG tablet Take 1,000 mcg by mouth daily.     No current facility-administered medications for this visit.    Allergies:   Bee venom, Entresto [sacubitril-valsartan], Tetracycline, and Ace inhibitors    Social History:  The patient  reports that she has been smoking cigarettes. She started smoking about 54 years ago. She has a 51.00 pack-year smoking history. She has never used smokeless tobacco. She reports that she does not drink alcohol or use drugs.   Family History:  The patient's family history includes COPD in her mother; Cancer in her father, paternal uncle, and sister; Diabetes in her mother; Drug abuse in her paternal grandmother; Glaucoma in her sister; Heart attack in her father; Heart disease in her maternal aunt, maternal grandfather, and mother; Hyperlipidemia in her mother; Hypertension in her mother and sister; Lung cancer in her paternal aunt and paternal uncle; Melanoma in her paternal uncle; Stroke in her paternal grandfather.    ROS: All other systems are reviewed and negative. Unless otherwise mentioned in H&P    PHYSICAL EXAM: VS:  BP 111/74   Pulse 92   Temp (!) 96.3 F (35.7 C)   Ht 4\' 9"  (1.448  m)   Wt 143 lb (64.9 kg)   LMP 09/02/2003   SpO2 99%   BMI 30.94 kg/m  , BMI Body mass index is 30.94 kg/m. GEN: Well nourished, well developed, in no acute distress HEENT: normal Neck: no JVD, carotid bruits, or masses Cardiac: RRR; no murmurs, rubs, or gallops,no edema  Respiratory: Bilateral crackles with wheezing, completely cleared after cough. GI: soft, nontender, nondistended, + BS MS: no deformity or atrophy, mild frailty. Skin: warm and dry, no rash Neuro:  Strength and sensation are intact Psych: euthymic mood, full affect   EKG: Normal sinus rhythm, left atrial enlargement, inferior Q waves are noted, rate of 92 bpm (unchanged from prior EKG January 2021).  Recent Labs: 04/21/2019: ALT 10; BUN 16; Creatinine, Ser 0.80; Potassium 5.1; Sodium 135; TSH 1.80 11/02/2019: Hemoglobin 8.8 Repeated and verified X2.; Platelets 182.0    Lipid Panel    Component Value Date/Time   CHOL 139 04/21/2019 1104   TRIG 163.0 (H) 04/21/2019 1104   TRIG 182 (H) 10/10/2006 1254   HDL 45.10 04/21/2019 1104   CHOLHDL 3 04/21/2019 1104   VLDL 32.6 04/21/2019 1104   LDLCALC 61 04/21/2019 1104   LDLDIRECT 76.0 04/11/2017 1408      Wt Readings from Last 3 Encounters:  11/03/19 143 lb (64.9 kg)  11/02/19 139 lb 5 oz (63.2 kg)  10/21/19 146 lb (66.2 kg)      Other studies Reviewed:  There is severe left ventricular systolic dysfunction.  LM lesion, 50% stenosed.  Prox LAD lesion, 90% stenosed.  Mid LAD lesion, 100% stenosed.  1st Diag lesion, 95% stenosed.  Ramus-1 lesion, 75% stenosed.  Ramus-2 lesion, 80% stenosed.  Mid Cx to Dist Cx lesion, 100% stenosed.  Ost Cx to Mid Cx lesion, 99% stenosed. A drug-eluting stent was placed. The lesion was previously treated with a drug-eluting stent greater than two years ago.  Ost 1st Mrg to 1st Mrg lesion, 100% stenosed. A drug-eluting stent was placed. The lesion was previously treated with a drug-eluting stent greater than two  years ago.  Ost RCA to Prox RCA lesion, 100% stenosed.  is normal in caliber, and is anatomically normal.  Prox RCA to Mid RCA lesion, 100% stenosed.  Dist RCA lesion, 100% stenosed.   1. Severe 3 vessel obstructive CAD.      - 50% distal left main stenosis.    - 100% occluded LAD, 99% first diagonal    - 75% proximal ramus intermediate. The intermediate bifurcates in the mid vessel and there is an 80% stenosis in the more superior branch    - 100% occlusion of the proximal LCx with diffuse occlusion of the stents in the LCx and first OM. There are left to left collaterals to the distal vessels.    - 100% occlusion of the proximal RCA. Faint right to right collaterals to the mid RCA. Left to right collaterals to the distal RCA.  2. Patent LIMA graft to the LAD  3. Known occlusion of all other bypass grafts from cardiac cath in 2007.  4. Severe LV dysfunction  5. Moderate pulmonary HTN  6. Normal LV filling pressures.   Recommendations: Continue medical management. She is not a candidate for redo CABG due to poor targets and prior early graft failure. She does not have suitable lesions for PCI.   Echocardiogram 04/25/2015  Left ventricle: The cavity size was normal. Wall thickness was  increased in a pattern of mild LVH. Basal inferoseptal akinesis,  basal to  mid inferior akinesis, basal to mid inferolateral severe  hypokinesis, basal anterolateral hypokinesis, The estimated  ejection fraction was 35%. Features are consistent with a  pseudonormal left ventricular filling pattern, with concomitant  abnormal relaxation and increased filling pressure (grade 2  diastolic dysfunction).  - Aortic valve: There was no stenosis.  - Mitral valve: There was moderate regurgitation, suspect  infarct-related MR with tethering of posterior leaflet.  - Left atrium: The atrium was mildly dilated.  - Right ventricle: The cavity size was normal. Systolic function  was  mildly to moderately reduced.  - Tricuspid valve: Peak RV-RA gradient (S): 42 mm Hg.  - Pulmonary arteries: PA peak pressure: 45 mm Hg (S).  - Inferior vena cava: The vessel was normal in size. The  respirophasic diameter changes were in the normal range (= 50%),  consistent with normal central venous pressure.    ASSESSMENT AND PLAN:  1.  Chronic systolic dysfunction with ICM: Most recent echocardiogram in 2016 revealed an EF of 35%.  I am going to repeat the echocardiogram as she had such a sudden decline in blood pressure with use of carvedilol, which was symptomatic with near syncope dizziness and weakness.  She will continue on furosemide twice daily,, with soft blood pressure do not want to add additional medications at this time until echocardiogram is completed.  2.  Coronary artery disease: History of CABG in 2007, most recent cardiac catheterization in 2017 with all grafts occluded with the exception of LIMA to LAD.  PCI with DES to the OM.  She will continue on aspirin and Plavix.  She offers no complaints of active bleeding at this time.  3.  Anemia: Followed by PCP with iron deficiency noted.  She is currently on iron replacement therapy.  May need referral to hematology at the discretion of her PCP.  I have given her a copy of her labs.  4.  Non-insulin-dependent diabetes: Followed by PCP  5.  Hyperlipidemia: Continues on rosuvastatin 10 mg daily.  I would like to have fasting lipids and LFTs repeated if not done by PCP by the time we see her again in 6 months.  6. Carotid Stenosis: Followed by Dr. Gwenlyn Found.  Current medicines are reviewed at length with the patient today.    Labs/ tests ordered today include: Echocardiogram  Phill Myron. West Pugh, ANP, AACC   11/03/2019 4:06 PM    [facility] [address] Office [phone] Fax [phone]  Notice: This dictation was prepared with Dragon dictation along with smaller phrase  technology. Any transcriptional errors that result from this process are unintentional and may not be corrected upon review.

## 2019-11-02 NOTE — Assessment & Plan Note (Signed)
Disc in detail risks of smoking and possible outcomes including copd, vascular/ heart disease, cancer , respiratory and sinus infections  Pt voices understanding Pt has cut down to 1/2 ppd Does not want to set a quit date  Has significant COPD

## 2019-11-02 NOTE — Assessment & Plan Note (Signed)
Enc pt to continue working on smoking cessation  Followed by pulmonary

## 2019-11-02 NOTE — Assessment & Plan Note (Signed)
Continues keppra from neurology  No recent seizures

## 2019-11-02 NOTE — Patient Instructions (Signed)
Let's check some iron levels today  If it is down as expected then I may refer you to a hematologist to help [place] out   (you may need IV iron instead of oral)  Keep taking your iron for now  We will call you   If you develop an GI symptoms please let me know

## 2019-11-02 NOTE — Assessment & Plan Note (Signed)
This is worse (cardiology labs- Hb 8.2 with low MCV) No GI symptoms  Rev last colonoscopy and EGD with pt  Also hospital w/u from 5/20 incl CT scan She takes niferex bid- initially with improvement  Dark stools are due to above  No abd pain or GI symptoms  Does take a ppi (dexilant) Renal labs were nl  Suspect not absorbing orally  Repeat cbc with iron studies today  Most likely will follow with hematology ref (may benefit from IV iron)

## 2019-11-03 ENCOUNTER — Ambulatory Visit (INDEPENDENT_AMBULATORY_CARE_PROVIDER_SITE_OTHER): Payer: Medicare Other | Admitting: Adult Health

## 2019-11-03 ENCOUNTER — Encounter: Payer: Self-pay | Admitting: Adult Health

## 2019-11-03 VITALS — BP 111/74 | HR 92 | Temp 96.3°F | Ht <= 58 in | Wt 143.0 lb

## 2019-11-03 DIAGNOSIS — E1142 Type 2 diabetes mellitus with diabetic polyneuropathy: Secondary | ICD-10-CM

## 2019-11-03 DIAGNOSIS — I2510 Atherosclerotic heart disease of native coronary artery without angina pectoris: Secondary | ICD-10-CM

## 2019-11-03 DIAGNOSIS — Z951 Presence of aortocoronary bypass graft: Secondary | ICD-10-CM | POA: Diagnosis not present

## 2019-11-03 DIAGNOSIS — I519 Heart disease, unspecified: Secondary | ICD-10-CM

## 2019-11-03 DIAGNOSIS — Z794 Long term (current) use of insulin: Secondary | ICD-10-CM | POA: Diagnosis not present

## 2019-11-03 DIAGNOSIS — I6523 Occlusion and stenosis of bilateral carotid arteries: Secondary | ICD-10-CM | POA: Diagnosis not present

## 2019-11-03 DIAGNOSIS — I255 Ischemic cardiomyopathy: Secondary | ICD-10-CM

## 2019-11-03 DIAGNOSIS — J449 Chronic obstructive pulmonary disease, unspecified: Secondary | ICD-10-CM | POA: Diagnosis not present

## 2019-11-03 LAB — CBC WITH DIFFERENTIAL/PLATELET
Basophils Absolute: 0.1 10*3/uL (ref 0.0–0.1)
Basophils Relative: 1.7 % (ref 0.0–3.0)
Eosinophils Absolute: 0 10*3/uL (ref 0.0–0.7)
Eosinophils Relative: 0.7 % (ref 0.0–5.0)
HCT: 29.6 % — ABNORMAL LOW (ref 36.0–46.0)
Hemoglobin: 8.8 g/dL — ABNORMAL LOW (ref 12.0–15.0)
Lymphocytes Relative: 19.8 % (ref 12.0–46.0)
Lymphs Abs: 1 10*3/uL (ref 0.7–4.0)
MCHC: 29.9 g/dL — ABNORMAL LOW (ref 30.0–36.0)
MCV: 68.1 fl — ABNORMAL LOW (ref 78.0–100.0)
Monocytes Absolute: 0.5 10*3/uL (ref 0.1–1.0)
Monocytes Relative: 9.7 % (ref 3.0–12.0)
Neutro Abs: 3.3 10*3/uL (ref 1.4–7.7)
Neutrophils Relative %: 68.1 % (ref 43.0–77.0)
Platelets: 182 10*3/uL (ref 150.0–400.0)
RBC: 4.34 Mil/uL (ref 3.87–5.11)
RDW: 19.3 % — ABNORMAL HIGH (ref 11.5–15.5)
WBC: 4.8 10*3/uL (ref 4.0–10.5)

## 2019-11-03 LAB — FERRITIN: Ferritin: 6.3 ng/mL — ABNORMAL LOW (ref 10.0–291.0)

## 2019-11-03 LAB — IBC PANEL
Iron: 18 ug/dL — ABNORMAL LOW (ref 42–145)
Saturation Ratios: 2.5 % — ABNORMAL LOW (ref 20.0–50.0)
Transferrin: 517 mg/dL — ABNORMAL HIGH (ref 212.0–360.0)

## 2019-11-03 LAB — VITAMIN B12: Vitamin B-12: 477 pg/mL (ref 211–911)

## 2019-11-03 NOTE — Patient Instructions (Signed)
Medication Instructions:  Continue current medications  *If you need a refill on your cardiac medications before your next appointment, please call your pharmacy*  Lab Work: None Ordered  Testing/Procedures: Your physician has requested that you have an echocardiogram. Echocardiography is a painless test that uses sound waves to create images of your heart. It provides your doctor with information about the size and shape of your heart and how well your heart's chambers and valves are working. This procedure takes approximately one hour. There are no restrictions for this procedure.  Follow-Up: At [facility], you and your health needs are our priority.  As part of our continuing mission to provide you with exceptional heart care, we have created designated Provider Care Teams.  These Care Teams include your primary Cardiologist (physician) and Advanced Practice Providers (APPs -  Physician Assistants and Nurse Practitioners) who all work together to provide you with the care you need, when you need it.  Your next appointment:   Keep appointment with Dr Martinique on Tuesday April 27th @ 1:20 pm  The format for your next appointment:   In Person  Provider:   Peter Martinique, MD

## 2019-11-04 ENCOUNTER — Telehealth: Payer: Self-pay | Admitting: Physician Assistant

## 2019-11-04 ENCOUNTER — Telehealth: Payer: Self-pay | Admitting: Family Medicine

## 2019-11-04 ENCOUNTER — Telehealth: Payer: Self-pay | Admitting: *Deleted

## 2019-11-04 DIAGNOSIS — D509 Iron deficiency anemia, unspecified: Secondary | ICD-10-CM

## 2019-11-04 NOTE — Telephone Encounter (Signed)
Received an urgent referral from Dr. Roque Lias for IDA> Ms. Hagans has been cld and scheduled to see Cassie on 2/4 at 1pm. Pt aware to arrive 15 minutes early.

## 2019-11-04 NOTE — Telephone Encounter (Signed)
Left VM requesting pt to call the office back regarding lab results  

## 2019-11-04 NOTE — Telephone Encounter (Signed)
Addressed through results note  

## 2019-11-04 NOTE — Telephone Encounter (Signed)
-----   Message from Tammi Sou, [place] sent at 11/04/2019 12:06 PM EST ----- Pt notified of lab results and Dr. Marliss Coots comments. Pt said she agrees with referral if possible need to be in afternoon after 12pm, I advise her to let the [facility] know when she calls. Please put referral in

## 2019-11-04 NOTE — Telephone Encounter (Signed)
I placed the referral  Will route to [facility]

## 2019-11-05 ENCOUNTER — Encounter: Payer: Self-pay | Admitting: Physician Assistant

## 2019-11-05 ENCOUNTER — Inpatient Hospital Stay: Payer: Medicare Other

## 2019-11-05 ENCOUNTER — Other Ambulatory Visit: Payer: Self-pay | Admitting: Physician Assistant

## 2019-11-05 ENCOUNTER — Inpatient Hospital Stay: Payer: Medicare Other | Attending: Physician Assistant | Admitting: Physician Assistant

## 2019-11-05 ENCOUNTER — Other Ambulatory Visit: Payer: Self-pay

## 2019-11-05 VITALS — BP 127/73 | HR 98 | Resp 18 | Ht <= 58 in | Wt 143.7 lb

## 2019-11-05 DIAGNOSIS — D509 Iron deficiency anemia, unspecified: Secondary | ICD-10-CM

## 2019-11-05 DIAGNOSIS — Z79899 Other long term (current) drug therapy: Secondary | ICD-10-CM | POA: Diagnosis not present

## 2019-11-05 DIAGNOSIS — R195 Other fecal abnormalities: Secondary | ICD-10-CM

## 2019-11-05 DIAGNOSIS — I10 Essential (primary) hypertension: Secondary | ICD-10-CM | POA: Diagnosis not present

## 2019-11-05 DIAGNOSIS — Z8719 Personal history of other diseases of the digestive system: Secondary | ICD-10-CM | POA: Insufficient documentation

## 2019-11-05 DIAGNOSIS — E538 Deficiency of other specified B group vitamins: Secondary | ICD-10-CM

## 2019-11-05 DIAGNOSIS — E119 Type 2 diabetes mellitus without complications: Secondary | ICD-10-CM

## 2019-11-05 LAB — FERRITIN: Ferritin: 6 ng/mL — ABNORMAL LOW (ref 11–307)

## 2019-11-05 LAB — CMP (CANCER CENTER ONLY)
ALT: 9 U/L (ref 0–44)
AST: 13 U/L — ABNORMAL LOW (ref 15–41)
Albumin: 3.4 g/dL — ABNORMAL LOW (ref 3.5–5.0)
Alkaline Phosphatase: 101 U/L (ref 38–126)
Anion gap: 11 (ref 5–15)
BUN: 15 mg/dL (ref 8–23)
CO2: 24 mmol/L (ref 22–32)
Calcium: 8.7 mg/dL — ABNORMAL LOW (ref 8.9–10.3)
Chloride: 101 mmol/L (ref 98–111)
Creatinine: 0.82 mg/dL (ref 0.44–1.00)
GFR, Est AFR Am: >60 mL/min (ref >60)
GFR, Estimated: >60 mL/min (ref >60)
Glucose, Bld: 280 mg/dL — ABNORMAL HIGH (ref 70–99)
Potassium: 4.5 mmol/L (ref 3.5–5.1)
Sodium: 136 mmol/L (ref 135–145)
Total Bilirubin: 0.4 mg/dL (ref 0.3–1.2)
Total Protein: 6.5 g/dL (ref 6.5–8.1)

## 2019-11-05 LAB — CBC WITH DIFFERENTIAL (CANCER CENTER ONLY)
Abs Immature Granulocytes: 0.01 10*3/uL (ref 0.00–0.07)
Basophils Absolute: 0 10*3/uL (ref 0.0–0.1)
Basophils Relative: 0 %
Eosinophils Absolute: 0 10*3/uL (ref 0.0–0.5)
Eosinophils Relative: 0 %
HCT: 31 % — ABNORMAL LOW (ref 36.0–46.0)
Hemoglobin: 8.4 g/dL — ABNORMAL LOW (ref 12.0–15.0)
Immature Granulocytes: 0 %
Lymphocytes Relative: 13 %
Lymphs Abs: 0.7 10*3/uL (ref 0.7–4.0)
MCH: 19.9 pg — ABNORMAL LOW (ref 26.0–34.0)
MCHC: 27.1 g/dL — ABNORMAL LOW (ref 30.0–36.0)
MCV: 73.5 fL — ABNORMAL LOW (ref 80.0–100.0)
Monocytes Absolute: 0.4 10*3/uL (ref 0.1–1.0)
Monocytes Relative: 8 %
Neutro Abs: 4.1 10*3/uL (ref 1.7–7.7)
Neutrophils Relative %: 79 %
Platelet Count: 190 10*3/uL (ref 150–400)
RBC: 4.22 MIL/uL (ref 3.87–5.11)
RDW: 19.5 % — ABNORMAL HIGH (ref 11.5–15.5)
WBC Count: 5.2 10*3/uL (ref 4.0–10.5)
nRBC: 0 % (ref 0.0–0.2)

## 2019-11-05 LAB — IRON AND TIBC
Iron: 14 ug/dL — ABNORMAL LOW (ref 41–142)
Saturation Ratios: 2 % — ABNORMAL LOW (ref 21–57)
TIBC: 644 ug/dL — ABNORMAL HIGH (ref 236–444)
UIBC: 629 ug/dL — ABNORMAL HIGH (ref 120–384)

## 2019-11-05 LAB — VITAMIN B12: Vitamin B-12: 397 pg/mL (ref 180–914)

## 2019-11-05 LAB — FOLATE: Folate: 8 ng/mL (ref >5.9)

## 2019-11-05 LAB — LACTATE DEHYDROGENASE: LDH: 142 U/L (ref 98–192)

## 2019-11-05 NOTE — Progress Notes (Signed)
[place] Telephone:([phone]   [phone]  CONSULT NOTE  REFERRING PHYSICIAN: Dr. Loura Pardon  REASON FOR CONSULTATION:  Iron deficiency anemia  HPI Diane [name] is a 64 y.o. 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 Fuller Plan is referred to the clinic for evaluation for iron deficiency anemia.   The patient is unsure how long she has been anemic for. She states she was anemic post op after her resection of the pelvic mass in 2016. She believes her anemia recovered and was once again noted to be anemic in the Spring of 2020. She had FOBT performed at that time which was heme positive. She then underwent an upper endoscopy which was unremarkable. She did not have a colonoscopy at that time because she was reportedly up to date. Her last colonoscopy was in 2018 and was unremarkable except for a few internal hemorrhoids.  The patient has been reportedly taking iron supplements 150 mg twice daily since July 2020.  The patient presented to her primary care provider on November 02, 2019 for routine blood work which noted persistent microcytic anemia with a hemoglobin of 8.8, MCV of 68.1, ferritin of 6.3, iron of 18, saturation ratio of 2.5.   Today, the patient is feeling fatigued and weak. She also states she is "cold all of the time". She is short of breath on exertion.  She reports easy bruising secondary to her Plavix and aspirin use.  She denies any epistaxis, gingival bleeding, hemoptysis, hematuria, or hematemesis.  She denies any hematochezia.  She reports melena and believes this  to be associated with her iron supplements. She denies any vaginal bleeding.  The patient is not a vegan or vegetarian.  The patient denies any history of iron or blood transfusions.   Her family medical history significant for mother who had CHF, diabetes mellitus, and COPD.  The patient's father had metastatic cancer, Alzheimer's, hypertension, and a myocardial infarction.  The patient sister has hypertension and reportedly had a malignancy in her eye last year.  The patient is disabled and she is not married and does not have any children.  The patient denies any drug or alcohol use.  She has a 53-year smoking history smoking approximately 2 packs a day but she has recently cut back to half a pack a day with the ultimate goal of quitting completely.   HPI  Past Medical History:  Diagnosis Date  . Anemia   . Carotid stenosis    a. s/p right carotid stent 10/2017.  [name] Chronic systolic CHF (congestive heart failure) ([place])   . Colon polyps   . COPD (chronic obstructive pulmonary disease) ([place])   . Coronary artery disease    post CABG in 3/07 , coronary stents   . Diabetes mellitus    type 2  . Dyslipidemia   . Fibromyalgia   . GERD (gastroesophageal reflux disease)   . Headache    hx of   . Hyperlipidemia   . Hypertension   . Myocardial infarction ([place])   . Pneumonia    hx of   . Seizures ([place])    last seizure- 03/2013   . Shortness of breath  dyspnea    with exertion or when fluid builds up   . Sleep apnea    used to wear a cpap- not used in 3 years   . Status post dilation of esophageal narrowing   . Stroke [facility]) 1993   problems with balance   . Systolic murmur    known mild AS and MR  . Thyroid goiter   . Tobacco abuse     Past Surgical History:  Procedure Laterality Date  . APPENDECTOMY    . BIOPSY  02/03/2019   Procedure: BIOPSY;  Surgeon: Ladene Artist, MD;  Location: [facility];  Service: Endoscopy;;  . BREAST BIOPSY Left 2016  . CARDIAC CATHETERIZATION   11/29/05   EF of 55%  . CARDIAC CATHETERIZATION  08/06/06   EF of 45-50%  . CARDIAC CATHETERIZATION N/A 05/06/2015   Procedure: Right/Left Heart Cath and Coronary/Graft Angiography;  Surgeon: Peter M Martinique, MD;  Location: [place] [facility];  Service: Cardiovascular;  Laterality: N/A;  . CAROTID PTA/STENT INTERVENTION N/A 10/10/2017   Procedure: CAROTID PTA/STENT INTERVENTION - Right;  Surgeon: Serafina Mitchell, MD;  Location: [place] [facility];  Service: Cardiovascular;  Laterality: N/A;  . CERVICAL FUSION  1990  . CHOLECYSTECTOMY    . COLON RESECTION     mass removed and 4 in of colon  . COLONOSCOPY WITH PROPOFOL N/A 07/16/2017   Procedure: COLONOSCOPY WITH PROPOFOL;  Surgeon: Ladene Artist, MD;  Location: [facility];  Service: Endoscopy;  Laterality: N/A;  . CORONARY ARTERY BYPASS GRAFT  12/04/2005   x5 -- left internal mammary artery to the LAD, left radial artery to the ramus intermedius, saphenous vein graft to the obtuse marginal 1, sequential saphenous vein grat to the acute marginal and posterior descending, endoscopic vein harvesting from the left thigh with open vein harvest from right leg  . CORONARY STENT PLACEMENT  08/11/06   PCI of her ciurcumflex/OM vessel  . ESOPHAGOGASTRODUODENOSCOPY (EGD) WITH PROPOFOL N/A 02/03/2019   Procedure: ESOPHAGOGASTRODUODENOSCOPY (EGD) WITH PROPOFOL;  Surgeon: Ladene Artist, MD;  Location: [facility];  Service: Endoscopy;  Laterality: N/A;  . LAPAROTOMY Bilateral 05/19/2015   Procedure: EXPLORATORY LAPAROTOMY WITH BILATERAL SALPINGO OOPHORECTOMY /OMENTECTOMY/SEGMENTAL SIGMOID COLECTOMY ;  Surgeon: Everitt Amber, MD;  Location: [facility];  Service: Gynecology;  Laterality: Bilateral;  . PERIPHERAL VASCULAR CATHETERIZATION N/A 11/15/2015   Procedure: Carotid PTA/Stent Intervention;  Surgeon: Lorretta Harp, MD;  Location: [place] [facility];  Service: Cardiovascular;  Laterality: N/A;  . PERIPHERAL VASCULAR CATHETERIZATION  11/15/2015   Procedure:  Carotid Angiography;  Surgeon: Lorretta Harp, MD;  Location: [place] [facility];  Service: Cardiovascular;;    Family History  Problem Relation Age of Onset  . Heart disease Mother   . Diabetes Mother   . COPD Mother   . Hyperlipidemia Mother   . Hypertension Mother   . Cancer Father        met, origin unknown  . Heart attack Father   . Drug abuse Paternal Grandmother   . Stroke Paternal Grandfather   . Lung cancer Paternal Aunt        lung with mets to brain  . Melanoma Paternal Uncle   . Lung cancer Paternal Uncle        lung/liver to brain  . Cancer Paternal Uncle        cancer of unknown type  . Heart disease Maternal Grandfather   . Hypertension Sister   . Cancer Sister  eyelid  . Glaucoma Sister   . Heart disease Maternal Aunt        x 2 aunts    Social History Social History   Tobacco Use  . Smoking status: Current Every Day Smoker    Packs/day: 1.00    Years: 51.00    Pack years: 51.00    Types: Cigarettes    Start date: 29  . Smokeless tobacco: Never Used  . Tobacco comment: 04/17/19 down to 1 pack her day--LCR  1.5 pack to 2 packs per day. Has been working on reducing the # of cigarettes she smokes, down to 1/2 PP7D  Substance Use Topics  . Alcohol use: No  . Drug use: No    Allergies  Allergen Reactions  . Bee Venom Itching and Swelling  . Entresto [Sacubitril-Valsartan] Cough  . Tetracycline     Unknown   . Ace Inhibitors Cough    Current Outpatient Medications  Medication Sig Dispense Refill  . acetaminophen (TYLENOL) 500 MG tablet Take 1,000 mg by mouth every 6 (six) hours as needed for moderate pain or headache.    . albuterol (VENTOLIN HFA) 108 (90 Base) MCG/ACT inhaler INHALER 2 PUFFS EVERY 6 HOURS AS NEEDED FOR WHEEZING/SHORTNESS OF BREATH 1 Inhaler 6  . aspirin EC 81 MG tablet Take 81 mg by mouth every evening.  30 tablet 6  . clopidogrel (PLAVIX) 75 MG tablet Take 1 tablet (75 mg total) by mouth daily. 90 tablet 3  .  dexlansoprazole (DEXILANT) 60 MG capsule TAKE 1 CAPSULE BY MOUTH EVERY DAY 90 capsule 3  . diphenhydrAMINE (BENADRYL) 25 mg capsule Take 2 capsules in AM and 1 capsule at night (Patient taking differently: Take 50 mg by mouth 2 (two) times daily as needed for itching or allergies. ) 270 capsule 3  . fluticasone (FLONASE) 50 MCG/ACT nasal spray Place 2 sprays into both nostrils daily. 16 g 11  . furosemide (LASIX) 40 MG tablet Take 1 tablet (40 mg total) by mouth 2 (two) times daily. 180 tablet 3  . gabapentin (NEURONTIN) 300 MG capsule TAKE 2 CAPSULES BY MOUTH AT BEDTIME 180 capsule 1  . glimepiride (AMARYL) 2 MG tablet Take 2 mg by mouth daily. Only in the am    . glimepiride (AMARYL) 4 MG tablet Take 4 mg by mouth daily. Only in the pm    . guaiFENesin (MUCINEX) 600 MG 12 hr tablet Take 2 tablets (1,200 mg total) by mouth 2 (two) times daily. 360 tablet 3  . iron polysaccharides (NU-IRON) 150 MG capsule Take 1 capsule (150 mg total) by mouth 2 (two) times daily. 180 capsule 3  . isosorbide mononitrate (IMDUR) 30 MG 24 hr tablet Take 1 tablet (30 mg total) by mouth daily. 90 tablet 3  . levETIRAcetam (KEPPRA) 1000 MG tablet TAKE 1 TABLET BY MOUTH TWICE A DAY 180 tablet 3  . metFORMIN (GLUCOPHAGE-XR) 500 MG 24 hr tablet Take 500 mg by mouth 2 (two) times daily.  6  . nicotine polacrilex (NICORETTE) 4 MG gum Take 1 each (4 mg total) by mouth as needed for smoking cessation. 90 tablet 2  . nitroGLYCERIN (NITROLINGUAL) 0.4 MG/SPRAY spray Place 1 spray under the tongue every 5 (five) minutes x 3 doses as needed for chest pain. 12 g 3  . nystatin (NYSTATIN) powder Apply topically 4 (four) times daily. 15 g 3  . ONE TOUCH ULTRA TEST test strip USE AS DIRECTED FOR TESTING BLOOD GLUCOSE 3 TIMES DAILY  1  .  polyethylene glycol powder (GLYCOLAX/MIRALAX) powder TAKE 1 DOSE 17 GRAMS IN 8 OZ OF WATER DAILY AS NEEDED FOR CONSTIPATION. (Patient taking differently: Take 17 g by mouth daily. Take 1 dose 17 grams in 8  oz of water daily for constipation.) 527 g 3  . potassium chloride (KLOR-CON M10) 10 MEQ tablet Take 1 tablet (10 mEq total) by mouth 2 (two) times daily. 180 tablet 3  . ranolazine (RANEXA) 1000 MG SR tablet Take 1 tablet (1,000 mg total) by mouth 2 (two) times daily. 180 tablet 3  . rosuvastatin (CRESTOR) 10 MG tablet TAKE 1 TABLET BY MOUTH EVERY DAY 90 tablet 1  . sodium chloride (OCEAN) 0.65 % SOLN nasal spray Place 1 spray into both nostrils as needed for congestion.    . vitamin B-12 (CYANOCOBALAMIN) 1000 MCG tablet Take 1,000 mcg by mouth daily.     No current facility-administered medications for this visit.    Review of Systems REVIEW OF SYSTEMS:   Review of Systems  Constitutional: Positive for fatigue and generalized weakness. Negative for appetite change, chills, fever and unexpected weight change.  HENT: Negative for mouth sores, nosebleeds, sore throat and trouble swallowing.  Eyes: Negative for eye problems and icterus.  Respiratory: Positive for shortness of breath with exertion. Negative for cough, hemoptysis, and wheezing.   Cardiovascular: Negative for chest pain and leg swelling.  Gastrointestinal: Positive for dark stool. Negative for abdominal pain, constipation, diarrhea, nausea and vomiting.  Genitourinary: Negative for bladder incontinence, difficulty urinating, dysuria, frequency and hematuria.   Musculoskeletal: Negative for back pain, gait problem, neck pain and neck stiffness.  Skin: Negative for itching and rash.  Neurological: Negative for dizziness, extremity weakness, gait problem, headaches, light-headedness and seizures.  Hematological: Negative for adenopathy. Does not bruise/bleed easily.  Psychiatric/Behavioral: Negative for confusion, depression and sleep disturbance. The patient is not nervous/anxious.     PHYSICAL EXAMINATION:  Blood pressure 127/73, pulse 98, resp. rate 18, height '4\' 9"'  (1.448 m), weight 143 lb 11.2 oz (65.2 kg), last menstrual  period 09/02/2003, SpO2 100 %.  ECOG PERFORMANCE STATUS: 1  Physical Exam  Constitutional: Oriented to person, place, and time and well-developed, well-nourished, and in no distress.   HENT:  Head: Normocephalic and atraumatic.  Mouth/Throat: Oropharynx is clear and moist. No oropharyngeal exudate.  Eyes: Conjunctivae are normal. Right eye exhibits no discharge. Left eye exhibits no discharge. No scleral icterus.  Neck: Normal range of motion. Neck supple.  Cardiovascular: Murmur noted. Normal rate, regular rhythm, and intact distal pulses.   Pulmonary/Chest: Effort normal and breath sounds normal. No respiratory distress. No wheezes. No rales.  Abdominal: Soft. Bowel sounds are normal. Exhibits no distension and no mass. There is no tenderness.  Musculoskeletal: Normal range of motion. Exhibits no edema.  Lymphadenopathy:    No cervical adenopathy.  Neurological: Alert and oriented to person, place, and time. Exhibits normal muscle tone. Gait normal. Coordination normal.  Skin: Skin is warm and dry. No rash noted. Not diaphoretic. No erythema. No pallor.  Psychiatric: Mood, memory and judgment normal.  Vitals reviewed.  LABORATORY DATA: Lab Results  Component Value Date   WBC 5.2 11/05/2019   HGB 8.4 (L) 11/05/2019   HCT 31.0 (L) 11/05/2019   MCV 73.5 (L) 11/05/2019   PLT 190 11/05/2019      Chemistry      Component Value Date/Time   NA 135 04/21/2019 1104   NA 135 (L) 02/15/2017 1457   K 5.1 04/21/2019 1104   K 4.8  02/15/2017 1457   CL 101 04/21/2019 1104   CO2 27 04/21/2019 1104   CO2 26 02/15/2017 1457   BUN 16 04/21/2019 1104   BUN 10.8 02/15/2017 1457   CREATININE 0.80 04/21/2019 1104   CREATININE 0.69 05/06/2017 1405   CREATININE 0.7 02/15/2017 1457      Component Value Date/Time   CALCIUM 9.4 04/21/2019 1104   CALCIUM 9.3 02/15/2017 1457   ALKPHOS 66 04/21/2019 1104   ALKPHOS 75 02/15/2017 1457   AST 11 04/21/2019 1104   AST 9 02/15/2017 1457   ALT 10  04/21/2019 1104   ALT 13 02/15/2017 1457   BILITOT 0.3 04/21/2019 1104   BILITOT 0.28 02/15/2017 1457       RADIOGRAPHIC STUDIES: No results found.  ASSESSMENT: This is a very pleasant 64 year old Caucasian female referred to the clinic for evaluation of iron deficiency anemia.   PLAN: The patient was seen with Dr. Julien Nordmann today.  Repeat labs with a CBC, CMP, ferritin, iron panel, folate, vitamin B12, and LDH was performed in the clinic today.  The patient continues to have persistent microcytic anemia with a hemoglobin of 8.4 and an MCV of 73.5.  Her other studies are still pending at this time.    The patient's ferritin and iron studies were very low from reviewing the blood work performed at her primary care provider's office despite taking oral iron supplements for the last 6 months.  We will arrange for the patient to have weekly IV iron infusions with Venofer x4.  She will receive her first infusion next week.  We will arrange for the patient to have FOBT testing performed to rule out GI blood loss.  If positive, will likely refer the patient to her gastroenterologist for consideration of repeat colonoscopy.  We will see the patient back for a follow-up visit in 8 weeks evaluation and repeat CBC, iron studies, and ferritin.  The patient voices understanding of current disease status and treatment options and is in agreement with the current care plan.  All questions were answered. The patient knows to call the clinic with any problems, questions or concerns. We can certainly see the patient much sooner if necessary.  Thank you so much for allowing me to participate in the care of Diane [name]. I will continue to follow up the patient with you and assist in her care.  The total time spent in the appointment was 60 minutes.  Disclaimer: This note was dictated with voice recognition software. Similar sounding words can inadvertently be transcribed and may not be corrected upon  review.   Chrisean Kloth L Anayiah Howden November 05, 2019, 1:14 PM   ADDENDUM: Hematology/Oncology Attending: I had a face-to-face encounter with the patient.  I recommended her care plan.  This is a very pleasant 64 years old white female with history of anemia for several months.  She was evaluated in July 2020 for similar problem and she was found to have stool positive for Hemoccult.  The patient underwent upper endoscopy that was unremarkable.  She had colonoscopy 3 years ago that also showed no concerning findings except for internal hemorrhoids.  The patient started recently oral iron tablets but no significant improvement in her parameter.  She had iron study and ferritin performed recently that showed severe iron deficiency. We order several studies today for evaluation of her anemia. I also recommended to start intravenous iron infusion with Venofer weekly for 4 doses. We will repeat her stool for Hemoccult.  If positive, will  refer the patient to her gastroenterologist. We will arrange for the patient to come back for follow-up visit in 8 weeks for evaluation and repeat CBC, iron study and ferritin. She was advised to call immediately if she has any other concerning symptoms in the interval.  Disclaimer: This note was dictated with voice recognition software. Similar sounding words can inadvertently be transcribed and may be missed upon review. Eilleen Kempf, MD 11/05/19

## 2019-11-08 DIAGNOSIS — Z8719 Personal history of other diseases of the digestive system: Secondary | ICD-10-CM | POA: Diagnosis not present

## 2019-11-08 DIAGNOSIS — D509 Iron deficiency anemia, unspecified: Secondary | ICD-10-CM | POA: Diagnosis not present

## 2019-11-08 DIAGNOSIS — Z79899 Other long term (current) drug therapy: Secondary | ICD-10-CM | POA: Diagnosis not present

## 2019-11-09 DIAGNOSIS — Z8719 Personal history of other diseases of the digestive system: Secondary | ICD-10-CM | POA: Diagnosis not present

## 2019-11-09 DIAGNOSIS — Z79899 Other long term (current) drug therapy: Secondary | ICD-10-CM | POA: Diagnosis not present

## 2019-11-09 DIAGNOSIS — D509 Iron deficiency anemia, unspecified: Secondary | ICD-10-CM | POA: Diagnosis not present

## 2019-11-12 ENCOUNTER — Other Ambulatory Visit: Payer: Self-pay | Admitting: Cardiology

## 2019-11-12 ENCOUNTER — Telehealth: Payer: Self-pay | Admitting: Physician Assistant

## 2019-11-12 ENCOUNTER — Inpatient Hospital Stay: Payer: Medicare Other

## 2019-11-12 ENCOUNTER — Other Ambulatory Visit: Payer: Self-pay

## 2019-11-12 VITALS — BP 124/73 | HR 93 | Temp 97.6°F | Resp 18

## 2019-11-12 DIAGNOSIS — D509 Iron deficiency anemia, unspecified: Secondary | ICD-10-CM

## 2019-11-12 DIAGNOSIS — E538 Deficiency of other specified B group vitamins: Secondary | ICD-10-CM

## 2019-11-12 DIAGNOSIS — Z8719 Personal history of other diseases of the digestive system: Secondary | ICD-10-CM | POA: Diagnosis not present

## 2019-11-12 DIAGNOSIS — Z79899 Other long term (current) drug therapy: Secondary | ICD-10-CM | POA: Diagnosis not present

## 2019-11-12 LAB — OCCULT BLOOD X 1 CARD TO LAB, STOOL
Fecal Occult Bld: NEGATIVE
Fecal Occult Bld: NEGATIVE
Fecal Occult Bld: NEGATIVE

## 2019-11-12 MED ORDER — SODIUM CHLORIDE 0.9 % IV SOLN
200.0000 mg | Freq: Once | INTRAVENOUS | Status: AC
  Administered 2019-11-12: 200 mg via INTRAVENOUS
  Filled 2019-11-12: qty 200

## 2019-11-12 MED ORDER — DIPHENHYDRAMINE HCL 25 MG PO CAPS
ORAL_CAPSULE | ORAL | Status: AC
  Filled 2019-11-12: qty 2

## 2019-11-12 MED ORDER — DIPHENHYDRAMINE HCL 25 MG PO CAPS
50.0000 mg | ORAL_CAPSULE | Freq: Once | ORAL | Status: AC
  Administered 2019-11-12: 50 mg via ORAL

## 2019-11-12 MED ORDER — SODIUM CHLORIDE 0.9 % IV SOLN
Freq: Once | INTRAVENOUS | Status: AC
  Filled 2019-11-12: qty 250

## 2019-11-12 MED ORDER — ACETAMINOPHEN 325 MG PO TABS
ORAL_TABLET | ORAL | Status: AC
  Filled 2019-11-12: qty 2

## 2019-11-12 MED ORDER — ACETAMINOPHEN 325 MG PO TABS
650.0000 mg | ORAL_TABLET | Freq: Once | ORAL | Status: AC
  Administered 2019-11-12: 650 mg via ORAL

## 2019-11-12 NOTE — Patient Instructions (Signed)

## 2019-11-13 LAB — PROTEIN ELECTROPHORESIS, SERUM, WITH REFLEX
A/G Ratio: 1.3 (ref 0.7–1.7)
Albumin ELP: 3.6 g/dL (ref 2.9–4.4)
Alpha-1-Globulin: 0.3 g/dL (ref 0.0–0.4)
Alpha-2-Globulin: 0.8 g/dL (ref 0.4–1.0)
Beta Globulin: 1.2 g/dL (ref 0.7–1.3)
Gamma Globulin: 0.5 g/dL (ref 0.4–1.8)
Globulin, Total: 2.7 g/dL (ref 2.2–3.9)
Total Protein ELP: 6.3 g/dL (ref 6.0–8.5)

## 2019-11-17 ENCOUNTER — Ambulatory Visit (HOSPITAL_COMMUNITY): Payer: Medicare Other | Attending: Adult Health

## 2019-11-17 ENCOUNTER — Other Ambulatory Visit: Payer: Self-pay

## 2019-11-17 DIAGNOSIS — I519 Heart disease, unspecified: Secondary | ICD-10-CM | POA: Diagnosis not present

## 2019-11-17 DIAGNOSIS — I255 Ischemic cardiomyopathy: Secondary | ICD-10-CM

## 2019-11-18 ENCOUNTER — Other Ambulatory Visit: Payer: Self-pay | Admitting: Physician Assistant

## 2019-11-18 ENCOUNTER — Inpatient Hospital Stay: Payer: Medicare Other

## 2019-11-18 ENCOUNTER — Other Ambulatory Visit: Payer: Self-pay

## 2019-11-18 VITALS — BP 114/78 | HR 90 | Temp 98.2°F | Resp 19 | Ht <= 58 in | Wt 143.3 lb

## 2019-11-18 DIAGNOSIS — D509 Iron deficiency anemia, unspecified: Secondary | ICD-10-CM

## 2019-11-18 DIAGNOSIS — Z79899 Other long term (current) drug therapy: Secondary | ICD-10-CM | POA: Diagnosis not present

## 2019-11-18 DIAGNOSIS — Z8719 Personal history of other diseases of the digestive system: Secondary | ICD-10-CM | POA: Diagnosis not present

## 2019-11-18 MED ORDER — ACETAMINOPHEN 325 MG PO TABS
ORAL_TABLET | ORAL | Status: AC
  Filled 2019-11-18: qty 2

## 2019-11-18 MED ORDER — SODIUM CHLORIDE 0.9 % IV SOLN
200.0000 mg | Freq: Once | INTRAVENOUS | Status: AC
  Administered 2019-11-18: 200 mg via INTRAVENOUS
  Filled 2019-11-18: qty 10

## 2019-11-18 MED ORDER — DIPHENHYDRAMINE HCL 25 MG PO CAPS
50.0000 mg | ORAL_CAPSULE | Freq: Once | ORAL | Status: AC
  Administered 2019-11-18: 25 mg via ORAL

## 2019-11-18 MED ORDER — ACETAMINOPHEN 325 MG PO TABS
650.0000 mg | ORAL_TABLET | Freq: Once | ORAL | Status: AC
  Administered 2019-11-18: 650 mg via ORAL

## 2019-11-18 MED ORDER — DIPHENHYDRAMINE HCL 25 MG PO CAPS
ORAL_CAPSULE | ORAL | Status: AC
  Filled 2019-11-18: qty 1

## 2019-11-18 MED ORDER — SODIUM CHLORIDE 0.9 % IV SOLN
Freq: Once | INTRAVENOUS | Status: AC
  Filled 2019-11-18: qty 250

## 2019-11-18 NOTE — Progress Notes (Signed)
Pt experiences jitteriness when taking benadryl.  Ok per PA Cassie to dec dose of benadryl today to 25 mg PO instead of 50 mg PO.  Pt aware of change in dose.  PA Cassie to change future tx plan doses to 25 mg PO.  Pt received 2nd dose of IV Venofer today, tolerated well.  VSS.  Stayed for entire 30 min post-infusion observation period w/out any issues.

## 2019-11-18 NOTE — Patient Instructions (Signed)

## 2019-11-19 ENCOUNTER — Inpatient Hospital Stay: Payer: Medicare Other

## 2019-11-20 ENCOUNTER — Telehealth: Payer: Self-pay

## 2019-11-20 ENCOUNTER — Telehealth: Payer: Self-pay | Admitting: Cardiology

## 2019-11-20 NOTE — Telephone Encounter (Signed)
I pended the px for 250 mg of vit D Please send to desired pharmacy Thanks

## 2019-11-20 NOTE — Telephone Encounter (Signed)
LM for Diane [name] (nurse w/[facility]) to return call

## 2019-11-20 NOTE — Telephone Encounter (Signed)
No phone # given in message for [facility]) so I called pt and no answer so left VM requesting pt to call the office back

## 2019-11-20 NOTE — Telephone Encounter (Signed)
Spoke with Almyra Free (nurse w/[facility]) who has been seeing patient for about 2 years. She reports patient is having continued bilateral LE edema. She called in with c/o of this about 1 month ago and was advised to increase lasix to 80mg  BID for 3 days. She was then seen by NP on 2/2. There is a MyChart message sent on 2/17 about a change to entresto, but patient did not review this and nurse Almyra Free does not have this info either. Per chart review, this is not on her med list. Almyra Free is asking if patient needs permanent increase in diuretic, or acute increase in dose d/t swelling? Her weight is 145lbs (last visit on 2/2 was 143lbs).   Will route to Dr. Martinique & Malachy Mood LPN to review and advise

## 2019-11-20 NOTE — Telephone Encounter (Signed)
Sorry [place] - I forgot to take down the phone number in my note for [facility] with [facility]. She can be reached at [phone]. I left a VM for her to call us back - to clarify about pharmacy for Vitamin C prescription.

## 2019-11-20 NOTE — Telephone Encounter (Signed)
I would continue BID lasix. Diane [name] note from 2/17 planned to start her on Entresto 24/26 mg bid and repeat BMET next week. Also planned referral to Advanced heart failure clinic.  Diane Kops Martinique MD, [facility] [place]

## 2019-11-20 NOTE — Telephone Encounter (Signed)
Per call from [place] w/[facility] needs a call back to speak to someone about this patients  Die[place] please.

## 2019-11-20 NOTE — Telephone Encounter (Signed)
I received a call from [place] with [facility]. She states she is wondering is Dr. Glori Bickers would be interested in writing an rx for Vitamin C for patient. She states patient is currently receiving iron infusions, and vitamin C will help with the iron absoroption. She states that if a prescription is written for this, patient's insurance will cover it. Dr. Glori Bickers, please advise.

## 2019-11-20 NOTE — Telephone Encounter (Signed)
Spoke to patient Dr.Jordan's advice given.Stated she cannot take entresto caused a bad non productive cough.Advised I will let Dr.Jordan know.Advised I will call and schedule appointment with Heart Failure clinic.

## 2019-11-23 ENCOUNTER — Telehealth: Payer: Self-pay | Admitting: Cardiology

## 2019-11-23 DIAGNOSIS — I5022 Chronic systolic (congestive) heart failure: Secondary | ICD-10-CM

## 2019-11-23 DIAGNOSIS — I519 Heart disease, unspecified: Secondary | ICD-10-CM

## 2019-11-23 MED ORDER — VITAMIN C 250 MG PO TABS: 250.0000 mg | ORAL_TABLET | Freq: Every day | ORAL | 3 refills | Status: DC

## 2019-11-23 NOTE — Telephone Encounter (Signed)
New message:    Patient care giver would like for some to call her concering some medications and also states that they wanted her to make apt for heart clinic. Please call Diane [name].

## 2019-11-23 NOTE — Telephone Encounter (Signed)
Heart failure clinic called left message patient needs appointment.

## 2019-11-23 NOTE — Telephone Encounter (Signed)
Almyra Free called back she gave me the pharmacy [facility], Rx sent

## 2019-11-23 NOTE — Telephone Encounter (Signed)
Spoke with patient to confirm OK to speak with Almyra Free - care [facility] nurse (as this was not previously specified who it was)  LM for Almyra Free to return call.   Will route to [place], LPN

## 2019-11-23 NOTE — Telephone Encounter (Signed)
Spoke with Almyra Free, care connection nurse. She states patient was told by someone with our group to take carvedilol, which she reports the patient cannot take d/t hypotension. Explained this is on her med list, added as historical med on 11/13/19 but I cannot locate who added it on there and why. Explained that MD suggested entresto, to which patient told Malachy Mood, LPN that she cannot take. Explained that patient has been referred to HF clinic, appointment pending. Patient expressed concern to Almyra Free about going to a new place for an appointment and I told Almyra Free that when HF clinic calls to arrange OV, patient should ask questions about location, access etc

## 2019-11-25 ENCOUNTER — Ambulatory Visit: Payer: Medicare Other

## 2019-11-26 ENCOUNTER — Other Ambulatory Visit: Payer: Self-pay

## 2019-11-26 ENCOUNTER — Inpatient Hospital Stay: Payer: Medicare Other

## 2019-11-26 VITALS — BP 120/67 | HR 90 | Temp 98.2°F | Resp 17

## 2019-11-26 DIAGNOSIS — D509 Iron deficiency anemia, unspecified: Secondary | ICD-10-CM | POA: Diagnosis not present

## 2019-11-26 DIAGNOSIS — Z8719 Personal history of other diseases of the digestive system: Secondary | ICD-10-CM | POA: Diagnosis not present

## 2019-11-26 DIAGNOSIS — Z79899 Other long term (current) drug therapy: Secondary | ICD-10-CM | POA: Diagnosis not present

## 2019-11-26 MED ORDER — DIPHENHYDRAMINE HCL 25 MG PO CAPS
25.0000 mg | ORAL_CAPSULE | Freq: Once | ORAL | Status: AC
  Administered 2019-11-26: 25 mg via ORAL

## 2019-11-26 MED ORDER — SODIUM CHLORIDE 0.9 % IV SOLN
200.0000 mg | Freq: Once | INTRAVENOUS | Status: AC
  Administered 2019-11-26: 200 mg via INTRAVENOUS
  Filled 2019-11-26: qty 200

## 2019-11-26 MED ORDER — SODIUM CHLORIDE 0.9 % IV SOLN
Freq: Once | INTRAVENOUS | Status: AC
  Filled 2019-11-26: qty 250

## 2019-11-26 MED ORDER — DIPHENHYDRAMINE HCL 25 MG PO CAPS
ORAL_CAPSULE | ORAL | Status: AC
  Filled 2019-11-26: qty 1

## 2019-11-26 MED ORDER — ACETAMINOPHEN 325 MG PO TABS
ORAL_TABLET | ORAL | Status: AC
  Filled 2019-11-26: qty 2

## 2019-11-26 MED ORDER — ACETAMINOPHEN 325 MG PO TABS
650.0000 mg | ORAL_TABLET | Freq: Once | ORAL | Status: AC
  Administered 2019-11-26: 650 mg via ORAL

## 2019-11-27 NOTE — Telephone Encounter (Signed)
Spoke to patient she stated she has not received appointment with Heart Failure Clinic. Spoke to [place] with Heart Failure Clinic.She advised after order is placed patient will get a call for a appointment.Order placed.Patient advised to expect a call with appointment next week.

## 2019-11-27 NOTE — Telephone Encounter (Signed)
See previous 2/26 telephone note.

## 2019-12-03 ENCOUNTER — Other Ambulatory Visit: Payer: Self-pay

## 2019-12-03 ENCOUNTER — Inpatient Hospital Stay: Payer: Medicare Other | Attending: Physician Assistant

## 2019-12-03 VITALS — BP 124/79 | HR 93 | Temp 98.0°F | Resp 16

## 2019-12-03 DIAGNOSIS — D509 Iron deficiency anemia, unspecified: Secondary | ICD-10-CM | POA: Insufficient documentation

## 2019-12-03 MED ORDER — SODIUM CHLORIDE 0.9 % IV SOLN
200.0000 mg | Freq: Once | INTRAVENOUS | Status: AC
  Administered 2019-12-03: 200 mg via INTRAVENOUS
  Filled 2019-12-03: qty 200

## 2019-12-03 MED ORDER — DIPHENHYDRAMINE HCL 25 MG PO CAPS
25.0000 mg | ORAL_CAPSULE | Freq: Once | ORAL | Status: AC
  Administered 2019-12-03: 25 mg via ORAL

## 2019-12-03 MED ORDER — ACETAMINOPHEN 325 MG PO TABS
650.0000 mg | ORAL_TABLET | Freq: Once | ORAL | Status: AC
  Administered 2019-12-03: 650 mg via ORAL

## 2019-12-03 MED ORDER — ACETAMINOPHEN 325 MG PO TABS
ORAL_TABLET | ORAL | Status: AC
  Filled 2019-12-03: qty 2

## 2019-12-03 MED ORDER — DIPHENHYDRAMINE HCL 25 MG PO CAPS
ORAL_CAPSULE | ORAL | Status: AC
  Filled 2019-12-03: qty 1

## 2019-12-03 MED ORDER — SODIUM CHLORIDE 0.9 % IV SOLN
Freq: Once | INTRAVENOUS | Status: AC
  Filled 2019-12-03: qty 250

## 2019-12-03 NOTE — Patient Instructions (Signed)

## 2019-12-04 ENCOUNTER — Telehealth: Payer: Self-pay

## 2019-12-04 ENCOUNTER — Telehealth: Payer: Self-pay | Admitting: Cardiology

## 2019-12-04 NOTE — Telephone Encounter (Signed)
EGD 5/20- erythematous mucosa in antrum  Continue dexilant daily.  Any breakthrough GERD symptoms despite this? If so, may have her try to add pepcid (famotidine) 20mg  at night time.  Would also offer OV with PCP for further evaluation if desired.

## 2019-12-04 NOTE — Telephone Encounter (Signed)
Error

## 2019-12-04 NOTE — Telephone Encounter (Signed)
Contacted patient to check on her with her HR. Patient states that she has felt fine today, she does have some lightheadedness and dizziness, SOB, denies chest pain, but can feel her heart facing at times- patient states her BP is normally not this elevated as it was today but she was up walking around and was anxious waiting for Almyra Free to come to her home. I advised patient to continue to monitor the BP and HR, if it continued to get higher in the 120's we would need to see her.  I did advise patient that normally if she is up walking around and moving the HR will be increased. Patient verbalized understanding.

## 2019-12-04 NOTE — Telephone Encounter (Signed)
Almyra Free with [facility], called to give update on patient because patient was not sure if Dr Glori Bickers knew about this. Patient has been having issues with getting strangled at times with swallowing food and drinks, and also with her own saliva. Patient sometimes gets out of breath trying to cough through these episodes. No passing out episodes. Also has been having issues with heart racing at random times, she can be just sitting and get that sensation. Heart rate today was 100 to 96. Almyra Free will call cardiologist office and let them know also. Brantley Fling that Dr Glori Bickers is not in the office and I will route this to another provider and [place] nurse to see what needs to be done.  Julie's CB is [phone]

## 2019-12-04 NOTE — Telephone Encounter (Signed)
   Almyra Free from [facility] called, she said she visited pt today and the pt voiced out she's been having elevated heart rate. Almyra Free checked her HR it was 94 with BP 140/90. She also wanted to let Dr. Martinique know that pt finished her last iron infusion yesterday.  Please advise

## 2019-12-06 NOTE — Telephone Encounter (Signed)
Please check in with her and see how she is doing  Thanks

## 2019-12-07 NOTE — Telephone Encounter (Signed)
Ex

## 2019-12-07 NOTE — Telephone Encounter (Signed)
Called pt and left VM requesting pt to call the office back, called Almyra Free and no answer so left VM requesting Almyra Free to call back also

## 2019-12-07 NOTE — Telephone Encounter (Signed)
Do encourage her to try the pepcid as Dr Darnell Level advised if she has not already.  I will see her then

## 2019-12-07 NOTE — Telephone Encounter (Signed)
Pt notified of DR. Tower's recommendations. Pt said she will try the Pepcid and keep appt on Wednesday with PCP

## 2019-12-07 NOTE — Telephone Encounter (Signed)
Patient contacted the office. Patient states she is not feeling any better and states that she is starting to have some swelling in her lower extremities. Patient states she is having issues with nausea from time to time, and it is hard for her to drink or eat anything to help resolve this because she gets strangled. Patient states she has not had any more issues with her heart feeling like it is racing, but she states she does feel short of breath at times when she is moving around.   From Dr. Synthia Innocent note, patient states she has had GERD, but she takes [place] for this and it seems to keep this under control.   Patient has been scheduled for f/u on Wednesday with Dr. Glori Bickers.

## 2019-12-09 ENCOUNTER — Other Ambulatory Visit: Payer: Self-pay

## 2019-12-09 ENCOUNTER — Ambulatory Visit (INDEPENDENT_AMBULATORY_CARE_PROVIDER_SITE_OTHER): Payer: Medicare Other | Admitting: Family Medicine

## 2019-12-09 ENCOUNTER — Encounter: Payer: Self-pay | Admitting: Family Medicine

## 2019-12-09 ENCOUNTER — Ambulatory Visit: Payer: Medicare Other

## 2019-12-09 VITALS — BP 112/76 | HR 88 | Temp 97.6°F | Ht <= 58 in | Wt 142.6 lb

## 2019-12-09 DIAGNOSIS — I255 Ischemic cardiomyopathy: Secondary | ICD-10-CM | POA: Diagnosis not present

## 2019-12-09 DIAGNOSIS — R1310 Dysphagia, unspecified: Secondary | ICD-10-CM | POA: Insufficient documentation

## 2019-12-09 DIAGNOSIS — I10 Essential (primary) hypertension: Secondary | ICD-10-CM | POA: Diagnosis not present

## 2019-12-09 DIAGNOSIS — I5022 Chronic systolic (congestive) heart failure: Secondary | ICD-10-CM

## 2019-12-09 DIAGNOSIS — E049 Nontoxic goiter, unspecified: Secondary | ICD-10-CM

## 2019-12-09 DIAGNOSIS — K219 Gastro-esophageal reflux disease without esophagitis: Secondary | ICD-10-CM

## 2019-12-09 DIAGNOSIS — R110 Nausea: Secondary | ICD-10-CM

## 2019-12-09 DIAGNOSIS — R1319 Other dysphagia: Secondary | ICD-10-CM

## 2019-12-09 DIAGNOSIS — F17200 Nicotine dependence, unspecified, uncomplicated: Secondary | ICD-10-CM

## 2019-12-09 NOTE — Assessment & Plan Note (Signed)
Fairly well controlled with dexilant and diet  She would benefit from head elevation at night but also needs foot elevation for CHF A hospital bed would be optimal if it was covered

## 2019-12-09 NOTE — Assessment & Plan Note (Signed)
bp in fair control at this time  BP Readings from Last 1 Encounters:  12/09/19 112/76   No changes needed Most recent labs reviewed  Disc lifstyle change with low sodium diet and exercise

## 2019-12-09 NOTE — Assessment & Plan Note (Signed)
Pt has f/u planned with a CHF clinic to see if they can fine tune her problems /edema and nausea

## 2019-12-09 NOTE — Assessment & Plan Note (Signed)
Pt sees Dr Chalmers Cater  She does not feel like this has grown or become obstructive

## 2019-12-09 NOTE — Progress Notes (Signed)
Subjective:    Patient ID: Diane [name], female    DOB: 07/25/1956, 64 y.o.   MRN: [id number]  This visit occurred during the SARS-CoV-2 public health emergency.  Safety protocols were in place, including screening questions prior to the visit, additional usage of staff PPE, and extensive cleaning of exam room while observing appropriate contact time as indicated for disinfecting solutions.    HPI Pt presents for swallowing problems   Wt Readings from Last 3 Encounters:  12/09/19 142 lb 9 oz (64.7 kg)  11/18/19 143 lb 4.8 oz (65 kg)  11/05/19 143 lb 11.2 oz (65.2 kg)   30.85 kg/m   She has swallowing problems and nausea  Even gets strangled on her own saliva  Feels like things either go down the wrong way  Sometimes also things get stuck 1/2 way down esophagus  No difference with certain foods or liquids   She takes zofran 4 mg -no relief  Thinks she has taken phenergan   Looked into getting hospital bed for CHF and GERD to help raise head and feet    When it happens she cough a lot   She takes dexilant for GERD  occ heartburn (rare) if she eats the wrong things  Has to avoid spicy acidic food/bev  Has a goiter but it has not grown (Dr Chalmers Cater)  She was too nauseated to see her yesterday   Vitals are nl today  Continues to smoke - down to 1/2 ppd now   Last EGD was 5/20  Had nl esophagus with small HH Also some erythematous tissue in gastric antrum   Also neg CT abd/pelvis 5/20   Recent screening chest CT 10/20 : IMPRESSION: 1. Lung-RADS 2, benign appearance or behavior. Continue annual screening with low-dose chest CT without contrast in 12 months. 2. Aortic atherosclerosis (ICD10-I70.0) and emphysema (ICD10-J43.9). 3. Similar saccular aneurysm or pseudoaneurysm about the descending thoracic aorta, maximally 1.2 cm. 4. Upper lung micronodularity suggests respiratory bronchiolitis.  Patient Active Problem List   Diagnosis Date Noted  . Dysphagia 12/09/2019   . Nausea 12/09/2019  . Iron deficiency anemia 11/05/2019  . Coronary artery disease of bypass graft of native heart with stable angina pectoris ([place]) 11/02/2019  . Visit for routine gyn exam 04/23/2019  . Generalized weakness 03/24/2019  . Occult blood in stools   . Positive colorectal cancer screening using DNA-based stool test 12/22/2018  . Anemia 12/22/2018  . Fatigue 12/05/2018  . Estrogen deficiency 04/21/2018  . Screening mammogram, encounter for 04/21/2018  . Osteopenia 04/21/2018  . History of HPV infection 04/21/2018  . Dizzy spells 03/03/2018  . Ischemic cardiomyopathy 01/21/2018  . Other constipation 05/07/2017  . Colon cancer screening 04/19/2017  . Incisional hernia 01/14/2017  . Smokers' cough ([place]) 08/13/2016  . Chronic respiratory failure ([place]) 07/11/2016  . Sleep-related hypoventilation due to lower airway obstruction 05/20/2016  . Periodic limb movements of sleep 05/20/2016  . Chronic systolic congestive heart failure ([place]) 05/08/2016  . Type 2 diabetes mellitus with diabetic polyneuropathy, with long-term current use of insulin ([place]) 12/13/2015  . B12 deficiency 12/01/2015  . History of CVA (cerebrovascular accident) 11/29/2015  . Abnormal nuclear stress test 05/06/2015  . 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 of localized onset, not intractable, without status epilepticus ([place]) 03/22/2015  . Cervical disc disorder with radiculopathy of cervical region 03/22/2015  . Complex partial seizures ([employer]) 09/16/2013  . Encounter for Medicare annual wellness exam 06/16/2013  . Antiplatelet or antithrombotic long-term  use 06/08/2013  . Seizures ([place]) 03/25/2013  . Goiter 09/01/2012  . Other screening mammogram 06/13/2012  . Allergic rhinitis 06/13/2012  . Carotid artery disease ([place]) 08/20/2011  . Obstructive sleep apnea 09/18/2007  . TOBACCO USE 08/08/2007  . Hyperlipidemia 03/26/2007  . ANXIETY 03/26/2007  . DEPRESSION  03/26/2007  . MIGRAINE HEADACHE 03/26/2007  . CARPAL TUNNEL SYNDROME, BILATERAL 03/26/2007  . Essential hypertension 03/26/2007  . Hx of CABG 03/26/2007  . GERD 03/26/2007  . Claudication in peripheral vascular disease ([place]) 03/26/2007   Past Medical History:  Diagnosis Date  . Anemia   . Carotid stenosis    a. s/p right carotid stent 10/2017.  [name] Chronic systolic CHF (congestive heart failure) ([place])   . Colon polyps   . COPD (chronic obstructive pulmonary disease) ([place])   . Coronary artery disease    post CABG in 3/07 , coronary stents   . Diabetes mellitus    type 2  . Dyslipidemia   . Fibromyalgia   . GERD (gastroesophageal reflux disease)   . Headache    hx of   . Hyperlipidemia   . Hypertension   . Myocardial infarction ([place])   . Pneumonia    hx of   . Seizures ([place])    last seizure- 03/2013   . Shortness of breath dyspnea    with exertion or when fluid builds up   . Sleep apnea    used to wear a cpap- not used in 3 years   . Status post dilation of esophageal narrowing   . Stroke [facility]) 1993   problems with balance   . Systolic murmur    known mild AS and MR  . Thyroid goiter   . Tobacco abuse    Past Surgical History:  Procedure Laterality Date  . APPENDECTOMY    . BIOPSY  02/03/2019   Procedure: BIOPSY;  Surgeon: Ladene Artist, MD;  Location: [facility];  Service: Endoscopy;;  . BREAST BIOPSY Left 2016  . CARDIAC CATHETERIZATION  11/29/05   EF of 55%  . CARDIAC CATHETERIZATION  08/06/06   EF of 45-50%  . CARDIAC CATHETERIZATION N/A 05/06/2015   Procedure: Right/Left Heart Cath and Coronary/Graft Angiography;  Surgeon: Peter M Martinique, MD;  Location: [place] [facility];  Service: Cardiovascular;  Laterality: N/A;  . CAROTID PTA/STENT INTERVENTION N/A 10/10/2017   Procedure: CAROTID PTA/STENT INTERVENTION - Right;  Surgeon: Serafina Mitchell, MD;  Location: [place] [facility];  Service: Cardiovascular;  Laterality: N/A;  . CERVICAL FUSION  1990  .  CHOLECYSTECTOMY    . COLON RESECTION     mass removed and 4 in of colon  . COLONOSCOPY WITH PROPOFOL N/A 07/16/2017   Procedure: COLONOSCOPY WITH PROPOFOL;  Surgeon: Ladene Artist, MD;  [facility]: [facility];  Service: Endoscopy;  Laterality: N/A;  . CORONARY ARTERY BYPASS GRAFT  12/04/2005   x5 -- left internal mammary artery to the LAD, left radial artery to the ramus intermedius, saphenous vein graft to the obtuse marginal 1, sequential saphenous vein grat to the acute marginal and posterior descending, endoscopic vein harvesting from the left thigh with open vein harvest from right leg  . CORONARY STENT PLACEMENT  08/11/06   PCI of her ciurcumflex/OM vessel  . ESOPHAGOGASTRODUODENOSCOPY (EGD) WITH PROPOFOL N/A 02/03/2019   Procedure: ESOPHAGOGASTRODUODENOSCOPY (EGD) WITH PROPOFOL;  Surgeon: Ladene Artist, MD;  [facility];  Service: Endoscopy;  Laterality: N/A;  . LAPAROTOMY Bilateral 05/19/2015   Procedure: EXPLORATORY LAPAROTOMY WITH BILATERAL  SALPINGO OOPHORECTOMY /OMENTECTOMY/SEGMENTAL SIGMOID COLECTOMY ;  Surgeon: Everitt Amber, MD;  Location: [facility];  Service: Gynecology;  Laterality: Bilateral;  . PERIPHERAL VASCULAR CATHETERIZATION N/A 11/15/2015   Procedure: Carotid PTA/Stent Intervention;  Surgeon: Lorretta Harp, MD;  Location: [place] [facility];  Service: Cardiovascular;  Laterality: N/A;  . PERIPHERAL VASCULAR CATHETERIZATION  11/15/2015   Procedure: Carotid Angiography;  Surgeon: Lorretta Harp, MD;  Location: [place] [facility];  Service: Cardiovascular;;   Social History   Tobacco Use  . Smoking status: Current Every Day Smoker    Packs/day: 1.00    Years: 51.00    Pack years: 51.00    Types: Cigarettes    Start date: 80  . Smokeless tobacco: Never Used  . Tobacco comment: 04/17/19 down to 1 pack her day--LCR  1.5 pack to 2 packs per day. Has been working on reducing the # of cigarettes she smokes, down to 1/2 PP7D  Substance Use Topics  . Alcohol use:  No  . Drug use: No   Family History  Problem Relation Age of Onset  . Heart disease Mother   . Diabetes Mother   . COPD Mother   . Hyperlipidemia Mother   . Hypertension Mother   . Cancer Father        met, origin unknown  . Heart attack Father   . Drug abuse Paternal Grandmother   . Stroke Paternal Grandfather   . Lung cancer Paternal Aunt        lung with mets to brain  . Melanoma Paternal Uncle   . Lung cancer Paternal Uncle        lung/liver to brain  . Cancer Paternal Uncle        cancer of unknown type  . Heart disease Maternal Grandfather   . Hypertension Sister   . Cancer Sister        eyelid  . Glaucoma Sister   . Heart disease Maternal Aunt        x 2 aunts   Allergies  Allergen Reactions  . Bee Venom Itching and Swelling  . Benadryl [Diphenhydramine] Other (See Comments)    Benadryl makes pt feel jittery/unable to sleep, recommend decreasing dose amount if able  . Entresto [Sacubitril-Valsartan] Cough  . Tetracycline     Unknown, pt cannot recall exact reaction  . Ace Inhibitors Cough   Current Outpatient Medications on File Prior to Visit  Medication Sig Dispense Refill  . acetaminophen (TYLENOL) 500 MG tablet Take 1,000 mg by mouth every 6 (six) hours as needed for moderate pain or headache.    . albuterol (VENTOLIN HFA) 108 (90 Base) MCG/ACT inhaler INHALER 2 PUFFS EVERY 6 HOURS AS NEEDED FOR WHEEZING/SHORTNESS OF BREATH 1 Inhaler 6  . aspirin EC 81 MG tablet Take 81 mg by mouth every evening.  30 tablet 6  . clopidogrel (PLAVIX) 75 MG tablet Take 1 tablet (75 mg total) by mouth daily. 90 tablet 3  . dexlansoprazole (DEXILANT) 60 MG capsule TAKE 1 CAPSULE BY MOUTH EVERY DAY 90 capsule 3  . diphenhydrAMINE (BENADRYL) 25 mg capsule Take 2 capsules in AM and 1 capsule at night (Patient taking differently: Take 50 mg by mouth 2 (two) times daily as needed for itching or allergies. ) 270 capsule 3  . fluticasone (FLONASE) 50 MCG/ACT nasal spray Place 2 sprays  into both nostrils daily. 16 g 11  . furosemide (LASIX) 40 MG tablet Take 1 tablet (40 mg total) by mouth 2 (two)  times daily. 180 tablet 3  . gabapentin (NEURONTIN) 300 MG capsule TAKE 2 CAPSULES BY MOUTH AT BEDTIME 180 capsule 1  . glimepiride (AMARYL) 2 MG tablet Take 2 mg by mouth daily. Only in the am    . glimepiride (AMARYL) 4 MG tablet Take 4 mg by mouth daily. Only in the pm    . guaiFENesin (MUCINEX) 600 MG 12 hr tablet Take 2 tablets (1,200 mg total) by mouth 2 (two) times daily. 360 tablet 3  . iron polysaccharides (NU-IRON) 150 MG capsule Take 1 capsule (150 mg total) by mouth 2 (two) times daily. 180 capsule 3  . isosorbide mononitrate (IMDUR) 30 MG 24 hr tablet Take 1 tablet (30 mg total) by mouth daily. 90 tablet 3  . levETIRAcetam (KEPPRA) 1000 MG tablet TAKE 1 TABLET BY MOUTH TWICE A DAY 180 tablet 3  . metFORMIN (GLUCOPHAGE-XR) 500 MG 24 hr tablet Take 500 mg by mouth 2 (two) times daily.  6  . nicotine polacrilex (NICORETTE) 4 MG gum Take 1 each (4 mg total) by mouth as needed for smoking cessation. 90 tablet 2  . nitroGLYCERIN (NITROLINGUAL) 0.4 MG/SPRAY spray Place 1 spray under the tongue every 5 (five) minutes x 3 doses as needed for chest pain. 12 g 3  . nystatin (NYSTATIN) powder Apply topically 4 (four) times daily. 15 g 3  . ondansetron (ZOFRAN) 4 MG tablet Take 4 mg by mouth every 6 (six) hours as needed.    . ONE TOUCH ULTRA TEST test strip USE AS DIRECTED FOR TESTING BLOOD GLUCOSE 3 TIMES DAILY  1  . polyethylene glycol powder (GLYCOLAX/MIRALAX) powder TAKE 1 DOSE 17 GRAMS IN 8 OZ OF WATER DAILY AS NEEDED FOR CONSTIPATION. (Patient taking differently: Take 17 g by mouth daily. Take 1 dose 17 grams in 8 oz of water daily for constipation.) 527 g 3  . potassium chloride (KLOR-CON M10) 10 MEQ tablet Take 1 tablet (10 mEq total) by mouth 2 (two) times daily. 180 tablet 3  . ranolazine (RANEXA) 1000 MG SR tablet Take 1 tablet (1,000 mg total) by mouth 2 (two) times daily.  180 tablet 3  . rosuvastatin (CRESTOR) 10 MG tablet TAKE 1 TABLET BY MOUTH EVERY DAY 90 tablet 1  . sodium chloride (OCEAN) 0.65 % SOLN nasal spray Place 1 spray into both nostrils as needed for congestion.    . vitamin B-12 (CYANOCOBALAMIN) 1000 MCG tablet Take 1,000 mcg by mouth daily.    . vitamin C (ASCORBIC ACID) 250 MG tablet Take 1 tablet (250 mg total) by mouth daily. 90 tablet 3   No current facility-administered medications on file prior to visit.    Review of Systems  Constitutional: Positive for fatigue. Negative for activity change, appetite change, fever and unexpected weight change.  HENT: Positive for trouble swallowing. Negative for congestion, ear pain, rhinorrhea, sinus pressure, sore throat and voice change.   Eyes: Negative for pain, redness and visual disturbance.  Respiratory: Positive for shortness of breath. Negative for cough and wheezing.        Copd  Cardiovascular: Positive for leg swelling. Negative for chest pain and palpitations.  Gastrointestinal: Positive for nausea. Negative for abdominal distention, abdominal pain, blood in stool, constipation, diarrhea, rectal pain and vomiting.  Endocrine: Negative for polydipsia and polyuria.  Genitourinary: Negative for dysuria, frequency and urgency.  Musculoskeletal: Negative for arthralgias, back pain and myalgias.  Skin: Negative for pallor and rash.  Allergic/Immunologic: Negative for environmental allergies.  Neurological: Negative for dizziness,  syncope and headaches.  Hematological: Negative for adenopathy. Does not bruise/bleed easily.  Psychiatric/Behavioral: Negative for decreased concentration and dysphoric mood. The patient is not nervous/anxious.        Objective:   Physical Exam Constitutional:      General: She is not in acute distress.    Appearance: Normal appearance. She is well-developed. She is obese. She is not ill-appearing or diaphoretic.     Comments: Exam done in chair  HENT:      Head: Normocephalic and atraumatic.     Mouth/Throat:     Mouth: Mucous membranes are moist.  Eyes:     General: No scleral icterus.       Right eye: No discharge.        Left eye: No discharge.     Conjunctiva/sclera: Conjunctivae normal.     Pupils: Pupils are equal, round, and reactive to light.  Neck:     Comments: No change re: thyroid noted Cardiovascular:     Rate and Rhythm: Normal rate and regular rhythm.     Heart sounds: Normal heart sounds.  Pulmonary:     Effort: Pulmonary effort is normal. No respiratory distress.     Breath sounds: Normal breath sounds. No wheezing or rales.     Comments: Diffusely distant bs  No wheeze today Abdominal:     General: Bowel sounds are normal. There is no distension.     Palpations: Abdomen is soft. There is no mass.     Tenderness: There is no abdominal tenderness. There is no guarding or rebound.  Musculoskeletal:     Cervical back: Normal range of motion and neck supple. No rigidity.     Right lower leg: Edema present.     Left lower leg: Edema present.     Comments: Trace pedal edema bilat  Lymphadenopathy:     Cervical: No cervical adenopathy.  Skin:    General: Skin is warm and dry.     Coloration: Skin is not pale.     Findings: No erythema.  Neurological:     Mental Status: She is alert.     Cranial Nerves: No cranial nerve deficit.     Coordination: Coordination normal.  Psychiatric:        Mood and Affect: Mood normal.           Assessment & Plan:   Problem List Items Addressed This Visit      Cardiovascular and Mediastinum   Essential hypertension    bp in fair control at this time  BP Readings from Last 1 Encounters:  12/09/19 112/76   No changes needed Most recent labs reviewed  Disc lifstyle change with low sodium diet and exercise        Chronic systolic congestive heart failure ([place])    Pt has f/u planned with a CHF clinic to see if they can fine tune her problems /edema and nausea           Digestive   GERD    Fairly well controlled with dexilant and diet  She would benefit from head elevation at night but also needs foot elevation for CHF A hospital bed would be optimal if it was covered      Dysphagia    In pt with GERD/HH as well as copd/current smoker and past stroke  She is unsure how much of her problem is pharyngeal and how much is lower in esophagus at this time Reviewed her large w/u last may incl CT  and EGD-unremarkable for the most part  Offered ref for swallowing study (last was in 2011) but she declined saying she had too many appointments  She will let us know when ready or if symptoms worsen  Enc to continue the dexilant        Endocrine   Goiter    Pt sees Dr Chalmers Cater  She does not feel like this has grown or become obstructive         Other   TOBACCO USE    Commended on slow cut down so far From 2ppd to 1/2 ppd currently  She wants to work on it more but not ready to go cold [place]  Disc in detail risks of smoking and possible outcomes including copd, vascular/ heart disease, cancer , respiratory and sinus infections  Pt voices understanding       Nausea - Primary    Chronic  Unsure if related to her complicated medical profile  CHF is a possible cause  Also GERD and polypharmacy  Ultimately would benefit from return to GI but she declines at this time  Takes zofran 4 mg w/o help- inst her to try 8 mg and get back to [place] Based on that will px the higher dose or switch to phenergan

## 2019-12-09 NOTE — Assessment & Plan Note (Signed)
Commended on slow cut down so far From 2ppd to 1/2 ppd currently  She wants to work on it more but not ready to go cold [place]  Disc in detail risks of smoking and possible outcomes including copd, vascular/ heart disease, cancer , respiratory and sinus infections  Pt voices understanding

## 2019-12-09 NOTE — Patient Instructions (Addendum)
Try 8 mg of zofran instead of 4 mg -- as needed  Let me know if it helps If not - we can switch to phenergan   I would like to get another swallowing study when you are ready  Trip back to GI may be helpful also   Continue your dexilant       Keep working on quitting smoking!

## 2019-12-09 NOTE — Assessment & Plan Note (Signed)
Chronic  Unsure if related to her complicated medical profile  CHF is a possible cause  Also GERD and polypharmacy  Ultimately would benefit from return to GI but she declines at this time  Takes zofran 4 mg w/o help- inst her to try 8 mg and get back to [place] Based on that will px the higher dose or switch to phenergan

## 2019-12-09 NOTE — Assessment & Plan Note (Signed)
In pt with GERD/HH as well as copd/current smoker and past stroke  She is unsure how much of her problem is pharyngeal and how much is lower in esophagus at this time Reviewed her large w/u last may incl CT and EGD-unremarkable for the most part  Offered ref for swallowing study (last was in 2011) but she declined saying she had too many appointments  She will let us know when ready or if symptoms worsen  Enc to continue the dexilant

## 2019-12-14 ENCOUNTER — Telehealth: Payer: Self-pay

## 2019-12-14 ENCOUNTER — Telehealth: Payer: Self-pay | Admitting: Family Medicine

## 2019-12-14 MED ORDER — PROMETHAZINE HCL 25 MG PO TABS: 25.0000 mg | ORAL_TABLET | Freq: Three times a day (TID) | ORAL | 1 refills | Status: DC | PRN

## 2019-12-14 NOTE — Telephone Encounter (Signed)
Left VM requesting pt to call the office back 

## 2019-12-14 NOTE — Telephone Encounter (Signed)
Patient contacted and said that she has been taking 2 of the Zofran tablets as advised, but it's not helping - and she is still feeling very nauseated. Patient is wondering if there is anything else she can try, or if there are any other recommendations from Dr. Glori Bickers?

## 2019-12-14 NOTE — Addendum Note (Signed)
Addended by: Loura Pardon A on: 12/14/2019 04:54 PM   Modules accepted: Orders

## 2019-12-14 NOTE — Telephone Encounter (Signed)
-----   Message from Harley Alto, RN sent at 12/14/2019  2:20 PM EDT ----- Regarding: Request for Hospital bed and bed side commode If possible could you send a referral to [facility] for a hospital bed and BSC. Patient is in need of these items as she continues to become s.o.b, weakness and overall decline. If you have any questions please give me a call, [phone]. Harley Alto LPN,CDP , Palliative Care Nurse with [facility]

## 2019-12-14 NOTE — Telephone Encounter (Signed)
Pt notified of Dr. Marliss Coots comments and that Rx was sent to pharmacy

## 2019-12-14 NOTE — Telephone Encounter (Signed)
Pt called back and said she would like it sent to [facility] [address]

## 2019-12-14 NOTE — Telephone Encounter (Signed)
We discussed trying phenergan - where does she want me to send it in ?

## 2019-12-14 NOTE — Telephone Encounter (Signed)
Diane Alto, RN  [name], [name], MD  If possible could you send a referral to [facility] for a hospital bed and BSC. Patient is in need of these items as she continues to become s.o.b, weakness and overall decline. If you have any questions please give me a call, [phone].  Diane Alto LPN,CDP , Palliative Care Nurse with [facility]

## 2019-12-14 NOTE — Telephone Encounter (Signed)
I sent it  Let me know if this does not help Caution of sedation

## 2019-12-15 NOTE — Telephone Encounter (Signed)
Left Almyra Free a VM requesting the fax # to send this order to

## 2019-12-15 NOTE — Telephone Encounter (Signed)
Order done and in IN box

## 2019-12-15 NOTE — Telephone Encounter (Signed)
Almyra Free called back and gave me [facility] Fax # of [phone]. Order, last OV note and insurance info faxed to [facility]

## 2019-12-16 ENCOUNTER — Telehealth: Payer: Self-pay | Admitting: Family Medicine

## 2019-12-16 NOTE — Telephone Encounter (Signed)
See prev note, only hospital Bed and BSC was included on order, please advise

## 2019-12-16 NOTE — Telephone Encounter (Signed)
Order faxed.

## 2019-12-16 NOTE — Telephone Encounter (Signed)
Patient called and her hospital bed was delivered today and the over the bed table wasn't included.  They told patient it had to be ordered by her PCP. Order needs to be sent to [facility].

## 2019-12-16 NOTE — Telephone Encounter (Signed)
Order is in IN box 

## 2019-12-17 ENCOUNTER — Telehealth: Payer: Self-pay | Admitting: Family Medicine

## 2019-12-17 ENCOUNTER — Ambulatory Visit: Payer: Medicare Other | Admitting: Physician Assistant

## 2019-12-17 ENCOUNTER — Other Ambulatory Visit: Payer: Medicare Other

## 2019-12-17 NOTE — Progress Notes (Signed)
°  Chronic Care Management   Outreach Note  12/17/2019 Name: Diane [name] MRN: [id number] DOB: 11-19-1955  Referred by: Tower, [name], MD Reason for referral : No chief complaint on file.   An unsuccessful telephone outreach was attempted today. The patient was referred to the pharmacist for assistance with care management and care coordination.   Follow Up Plan:   Raynicia Dukes [employer] Scheduler

## 2019-12-17 NOTE — Chronic Care Management (AMB) (Signed)
  Chronic Care Management   Note  12/17/2019 Name: [name] MRN: [id number] DOB: 05-23-1956  Diane [name] is a 64 y.o. year old female who is a primary care patient of Tower, [name], MD. I reached out to Velia Meyer by phone today in response to a referral sent by Ms. Chauncey Reading Hanken's PCP, Tower, [name], MD.   Diane [name] was given information about Chronic Care Management services today including:  1. CCM service includes personalized support from designated clinical staff supervised by her physician, including individualized plan of care and coordination with other care providers 2. 24/7 contact phone numbers for assistance for urgent and routine care needs. 3. Service will only be billed when office clinical staff spend 20 minutes or more in a month to coordinate care. 4. Only one practitioner may furnish and bill the service in a calendar month. 5. The patient may stop CCM services at any time (effective at the end of the month) by phone call to the office staff.   Patient agreed to services and verbal consent obtained.   Follow up plan:   Raynicia Dukes [employer] Scheduler

## 2019-12-23 ENCOUNTER — Ambulatory Visit: Payer: Medicare Other

## 2019-12-25 ENCOUNTER — Other Ambulatory Visit: Payer: Self-pay

## 2019-12-25 ENCOUNTER — Ambulatory Visit (INDEPENDENT_AMBULATORY_CARE_PROVIDER_SITE_OTHER): Payer: Medicare Other | Admitting: Pulmonary Disease

## 2019-12-25 ENCOUNTER — Encounter: Payer: Self-pay | Admitting: Pulmonary Disease

## 2019-12-25 ENCOUNTER — Ambulatory Visit (INDEPENDENT_AMBULATORY_CARE_PROVIDER_SITE_OTHER): Payer: Medicare Other

## 2019-12-25 VITALS — BP 130/74 | HR 96 | Temp 97.7°F | Ht <= 58 in | Wt 138.2 lb

## 2019-12-25 DIAGNOSIS — R0602 Shortness of breath: Secondary | ICD-10-CM

## 2019-12-25 DIAGNOSIS — J9611 Chronic respiratory failure with hypoxia: Secondary | ICD-10-CM | POA: Diagnosis not present

## 2019-12-25 DIAGNOSIS — I5022 Chronic systolic (congestive) heart failure: Secondary | ICD-10-CM | POA: Diagnosis not present

## 2019-12-25 DIAGNOSIS — F17200 Nicotine dependence, unspecified, uncomplicated: Secondary | ICD-10-CM | POA: Diagnosis not present

## 2019-12-25 DIAGNOSIS — Z0000 Encounter for general adult medical examination without abnormal findings: Secondary | ICD-10-CM | POA: Diagnosis not present

## 2019-12-25 MED ORDER — FLUTICASONE PROPIONATE 50 MCG/ACT NA SUSP: 2.0000 | Freq: Every day | NASAL | 1 refills | Status: DC

## 2019-12-25 MED ORDER — STIOLTO RESPIMAT 2.5-2.5 MCG/ACT IN AERS: 2.0000 | INHALATION_SPRAY | Freq: Every day | RESPIRATORY_TRACT | 0 refills | Status: DC

## 2019-12-25 MED ORDER — ALBUTEROL SULFATE HFA 108 (90 BASE) MCG/ACT IN AERS: INHALATION_SPRAY | RESPIRATORY_TRACT | 1 refills | Status: DC

## 2019-12-25 MED ORDER — AZITHROMYCIN 250 MG PO TABS: ORAL_TABLET | ORAL | 0 refills | Status: DC

## 2019-12-25 NOTE — Assessment & Plan Note (Signed)
Worsening echo Referred back to CHF clinic  Plan: Complete follow-up with CHF clinic next month Continue diuretics Continue to weigh yourself daily Follow low-sodium diet

## 2019-12-25 NOTE — Assessment & Plan Note (Signed)
Help patient coordinate COVID-19 vaccine appointment Paperwork provided for the patient

## 2019-12-25 NOTE — Patient Instructions (Addendum)
You were seen today by Lauraine Rinne, NP  for:   As always is a pleasure to see you.  Sorry that you are having more shortness of breath.  I do believe this may be more related to your heart failure.  I do want you to stop smoking.  We will do an x-ray today.  We have sent your medications as prescribed.  We will also give you a sample of Stiolto.  1. Shortness of breath  - DG Chest 2 View; Future  Trial of Stiolto Respimat inhaler >>>2 puffs daily >>>Take this no matter what >>>This is not a rescue inhaler   2. Chronic respiratory failure with hypoxia (HCC)  - fluticasone (FLONASE) 50 MCG/ACT nasal spray; Place 2 sprays into both nostrils daily.  Dispense: 48 g; Refill: 1 - albuterol (VENTOLIN HFA) 108 (90 Base) MCG/ACT inhaler; INHALER 2 PUFFS EVERY 6 HOURS AS NEEDED FOR WHEEZING/SHORTNESS OF BREATH  Dispense: 54 g; Refill: 1  3. Healthcare maintenance  We got she signed up for the Covid vaccine at [facility] today  Complete the paperwork as discussed  Please keep those appointments  4. TOBACCO USE  We recommend that you stop smoking.  >>>You need to set a quit date >>>If you have friends or family who smoke, let them know you are trying to quit and not to smoke around you or in your living environment  Smoking Cessation Resources:  1 800 QUIT NOW  >>> Patient to call this resource and utilize it to help support her quit smoking >>> Keep up your hard work with stopping smoking  You can also contact the [facility] >>>For smoking cessation classes call [phone]  We do not recommend using e-cigarettes as a form of stopping smoking  You can sign up for smoking cessation support texts and information:  >>>https://smokefree.gov/smokefreetxt    5. Chronic systolic congestive heart failure ([place])  Establish care with CHF clinic in April/2021   We recommend today:  Orders Placed This Encounter  Procedures  . DG Chest 2 View    Standing Status:    Future    Standing Expiration Date:   02/23/2021    Order Specific Question:   Reason for Exam (SYMPTOM  OR DIAGNOSIS REQUIRED)    Answer:   short of breath    Order Specific Question:   Preferred imaging location?    Answer:   Internal    Order Specific Question:   Radiology Contrast Protocol - do NOT remove file path    Answer:   [email or web address]   Orders Placed This Encounter  Procedures  . DG Chest 2 View   Meds ordered this encounter  Medications  . fluticasone (FLONASE) 50 MCG/ACT nasal spray    Sig: Place 2 sprays into both nostrils daily.    Dispense:  48 g    Refill:  1  . albuterol (VENTOLIN HFA) 108 (90 Base) MCG/ACT inhaler    Sig: INHALER 2 PUFFS EVERY 6 HOURS AS NEEDED FOR WHEEZING/SHORTNESS OF BREATH    Dispense:  54 g    Refill:  1  . Tiotropium Bromide-Olodaterol (STIOLTO RESPIMAT) 2.5-2.5 MCG/ACT AERS    Sig: Inhale 2 puffs into the lungs daily.    Dispense:  8 g    Refill:  0    Follow Up:    Return in about 2 months (around 02/24/2020), or if symptoms worsen or fail to improve, for Follow up with Dr. Halford Chessman, Follow up with Wyn Quaker FNP-C.  Please do your part to reduce the spread of COVID-19:      Reduce your risk of any infection  and COVID19 by using the similar precautions used for avoiding the common cold or flu:  [name] Wash your hands often with soap and warm water for at least 20 seconds.  If soap and water are not readily available, use an alcohol-based hand sanitizer with at least 60% alcohol.  . If coughing or sneezing, cover your mouth and nose by coughing or sneezing into the elbow areas of your shirt or coat, into a tissue or into your sleeve (not your hands). Langley Gauss A MASK when in public  . Avoid shaking hands with others and consider head nods or verbal greetings only. . Avoid touching your eyes, nose, or mouth with unwashed hands.  . Avoid close contact with people who are sick. . Avoid places or  events with large numbers of people in one location, like concerts or sporting events. . If you have some symptoms but not all symptoms, continue to monitor at home and seek medical attention if your symptoms worsen. . If you are having a medical emergency, call 911.   [place] / e-Visit: [email or web address]         [facility]: [facility]: [phone]                   [facility]: [phone]     It is flu season:   >>> Best ways to protect herself from the flu: Receive the yearly flu vaccine, practice good hand hygiene washing with soap and also using hand sanitizer when available, eat a nutritious meals, get adequate rest, hydrate appropriately   Please contact the office if your symptoms worsen or you have concerns that you are not improving.   Thank you for choosing [facility] for your healthcare, and for allowing [place] to partner with you on your healthcare journey. I am thankful to be able to provide care to you today.   Wyn Quaker FNP-C

## 2019-12-25 NOTE — Assessment & Plan Note (Signed)
07/2019-lung RADS 2 Ordered repeat lung cancer screening in 07/2020 Current smoker, working on decreasing smoking Has gone down from 2 packs to 0.5 packs  Plan: Congratulated patient on her success Encouraged her to continue to work on reducing her smoking.  Her hope is to be finished smoking by the time she sees [place] in 2 months Continue with the lung cancer screening program

## 2019-12-25 NOTE — Progress Notes (Signed)
@Patient  ID: Diane [name], female    DOB: January 15, 1956, 64 y.o.   MRN: [id number]  Chief Complaint  Patient presents with  . Follow-up    F/U on medication refills. Increased fluid in both legs. States her breathing has been labored.      Referring provider: Tower, [name], MD  HPI:  64 year old female current smoker followed in our office for bronchitis  PMH: Combined CHF, CAD s/p CABG, CVA, DM, HLD, HTN, Fibromyalgia, GERD, Goiter, Pneumonia, Seizures Smoker/ Smoking History: Current smoker. 0.5ppd. 70+ pack years.  Maintenance: none  Pt of: Dr. Halford Chessman  12/25/2019  - Visit   64 year old female presenting to our office today as a follow-up visit.  Patient reports that she feels like she has been more short of breath lately.  She is not currently on any maintenance inhalers.  She does still currently smoke.  She has made significant progress in her smoking.  She was previously smoking 2 packs/day she is now down to 0.5 packs/day.  She is doing this on her own.  She is very proud of her progress.  Patient is having difficulty getting signed up for the Covid vaccine due to not having Internet.  She had an appointment at the female clinic but she was very fatigued that day and could not go.  She is wondering if we have any resources to help her get core set up.  Patient has had a repeat echocardiogram that shows worsening ejection fraction.  She has been referred to the CHF clinic.  She has an initial appointment scheduled for April/2021.  She is maintained on diuretics at this time.  Patient is in our lung cancer screening program.  Last CT screening was in October/2020.  Is a lung RADS 2.  Repeat lung cancer screening CT ordered to be completed in October/2021.  Questionaires / Pulmonary Flowsheets:   MMRC: mMRC Dyspnea Scale mMRC Score  12/25/2019 2    Tests:   PSG 04/10/06>> RDI 105  CPAP 06/13/11 to 07/10/11>>Used on 20 of 28 nights with median 3 hrs 45 min.  Average AHI 5 with  median CPAP 7 cm H2O and 95th percentile CPAP 10 cm H2O. PSG 05/15/16 >> AHI 1.9, SpO2 low 83%  Pulmonary tests PFT 05/12/12>>FEV1 1.83 (98%), FEV1% 80, TLC 3.83 (98%), DLCO 61% (corrects for lung volumes), no BD response PFT 06/30/14 >> FEV1 1.80 (93%), FEV1% 86, TLC 4.35 (108%), DLCO 82%, no BD Spirometry 05/11/16 >> FEV1 1.74 (89%), FEV1% 76  ONO with RA 07/27/16 >> test time 6 hrs 25 min.  Mean SpO2 91.04%, low SpO2 68%.  Spent 18 min with SpO2 < 88%.  07/15/2018-pulmonary function test- FVC 2.15 (94% predicted), postbronchodilator ratio 87, postbronchodilator FEV1 1.91 (109% predicted), no significant bronchodilator response, slight mid flow reversibility after albuterol, DLCO 81 >>>Normal spirometry, No significant bronchodilator response, mild air trapping on lung volume testing   Cardiac tests Echo 04/25/15 >> EF 31%, grade 2 diastolic CHF, mod MR, PAS 45 mmHg   FENO:  No results found for: NITRICOXIDE  PFT: PFT Results Latest Ref Rng & Units 07/15/2018 06/30/2014  FVC-Pre L 2.15 2.14  FVC-Predicted Pre % 94 85  FVC-Post L 2.19 2.09  FVC-Predicted Post % 95 83  Pre FEV1/FVC % % 85 84  Post FEV1/FCV % % 87 86  FEV1-Pre L 1.83 1.80  FEV1-Predicted Pre % 105 93  FEV1-Post L 1.91 1.80  DLCO UNC% % 81 82  DLCO COR %Predicted % 82  83  TLC L 4.34 4.35  TLC % Predicted % 111 108  RV % Predicted % 129 133    WALK:  SIX MIN WALK 09/04/2016  Supplimental Oxygen during Test? (L/min) No  Tech Comments: pt walked a normal pace-- requested to carry her purse as she felt that this would best relate to her "normal" exertional acitivity. No desat, no c/o SOB --amg    Imaging: No results found.  Lab Results:  CBC    Component Value Date/Time   WBC 5.2 11/05/2019 1257   WBC 4.8 11/02/2019 1535   RBC 4.22 11/05/2019 1257   HGB 8.4 (L) 11/05/2019 1257   HCT 31.0 (L) 11/05/2019 1257   PLT 190 11/05/2019 1257   MCV 73.5 (L) 11/05/2019 1257   MCH 19.9 (L) 11/05/2019 1257   MCHC  27.1 (L) 11/05/2019 1257   RDW 19.5 (H) 11/05/2019 1257   LYMPHSABS 0.7 11/05/2019 1257   MONOABS 0.4 11/05/2019 1257   EOSABS 0.0 11/05/2019 1257   BASOSABS 0.0 11/05/2019 1257    BMET    Component Value Date/Time   NA 136 11/05/2019 1257   NA 135 (L) 02/15/2017 1457   K 4.5 11/05/2019 1257   K 4.8 02/15/2017 1457   CL 101 11/05/2019 1257   CO2 24 11/05/2019 1257   CO2 26 02/15/2017 1457   GLUCOSE 280 (H) 11/05/2019 1257   GLUCOSE 98 02/15/2017 1457   GLUCOSE 251 (H) 10/10/2006 1254   BUN 15 11/05/2019 1257   BUN 10.8 02/15/2017 1457   CREATININE 0.82 11/05/2019 1257   CREATININE 0.69 05/06/2017 1405   CREATININE 0.7 02/15/2017 1457   CALCIUM 8.7 (L) 11/05/2019 1257   CALCIUM 9.3 02/15/2017 1457   GFRNONAA >60 11/05/2019 1257   GFRNONAA 88 03/22/2015 1213   GFRAA >60 11/05/2019 1257   GFRAA >89 03/22/2015 1213    BNP No results found for: BNP  ProBNP No results found for: PROBNP  Specialty Problems      Pulmonary Problems   Obstructive sleep apnea    PSG 04/10/06>> RDI 105  CPAP 06/13/11 to 07/10/11>>Used on 20 of 28 nights with median 3 hrs 45 min.  Average AHI 5 with median CPAP 7 cm H2O and 95th percentile CPAP 10 cm H2O. PSG 05/15/16 >> AHI 1.9, SpO2 low 83%       Allergic rhinitis   Sleep-related hypoventilation due to lower airway obstruction   Chronic respiratory failure ([place])    ONO with RA 07/27/16 >> test time 6 hrs 25 min.  Mean SpO2 91.04%, low SpO2 68%.  Spent 18 min with SpO2 < 88%.      Smokers' cough (HCC)   Shortness of breath      Allergies  Allergen Reactions  . Bee Venom Itching and Swelling  . Benadryl [Diphenhydramine] Other (See Comments)    Benadryl makes pt feel jittery/unable to sleep, recommend decreasing dose amount if able  . Entresto [Sacubitril-Valsartan] Cough  . Tetracycline     Unknown, pt cannot recall exact reaction  . Ace Inhibitors Cough    Immunization History  Administered Date(s) Administered  . Fluad  Quad(high Dose 65+) 06/23/2019  . Influenza Whole 06/01/2012  . Influenza,inj,Quad PF,6+ Mos 06/16/2013, 06/16/2014, 06/21/2015, 08/13/2016, 10/11/2017, 07/15/2018  . Pneumococcal Conjugate-13 05/11/2016  . Pneumococcal Polysaccharide-23 11/18/1998, 11/04/2008, 06/16/2014, 06/23/2019  . Td 10/01/1996, 11/15/2009    Past Medical History:  Diagnosis Date  . Anemia   . Carotid stenosis    a. s/p right carotid stent 10/2017.  [name]  Chronic systolic CHF (congestive heart failure) ([place])   . Colon polyps   . COPD (chronic obstructive pulmonary disease) ([place])   . Coronary artery disease    post CABG in 3/07 , coronary stents   . Diabetes mellitus    type 2  . Dyslipidemia   . Fibromyalgia   . GERD (gastroesophageal reflux disease)   . Headache    hx of   . Hyperlipidemia   . Hypertension   . Myocardial infarction ([place])   . Pneumonia    hx of   . Seizures ([place])    last seizure- 03/2013   . Shortness of breath dyspnea    with exertion or when fluid builds up   . Sleep apnea    used to wear a cpap- not used in 3 years   . Status post dilation of esophageal narrowing   . Stroke [facility]) 1993   problems with balance   . Systolic murmur    known mild AS and MR  . Thyroid goiter   . Tobacco abuse     Tobacco History: Social History   Tobacco Use  Smoking Status Current Every Day Smoker  . Packs/day: 1.00  . Years: 51.00  . Pack years: 51.00  . Types: Cigarettes  . Start date: 36  Smokeless Tobacco Never Used  Tobacco Comment   04/17/19 down to 1 pack her day--LCR  1.5 pack to 2 packs per day. Has been working on reducing the # of cigarettes she smokes, down to 1/2 PP7D   Ready to quit: No Counseling given: Yes Comment: 04/17/19 down to 1 pack her day--LCR  1.5 pack to 2 packs per day. Has been working on reducing the # of cigarettes she smokes, down to 1/2 PP7D  Smoking assessment and cessation counseling  Patient currently smoking: 0.5 ppd  I have advised the patient to  quit/stop smoking as soon as possible due to high risk for multiple medical problems.  It will also be very difficult for [place] to manage patient's  respiratory symptoms and status if we continue to expose her lungs to a known irritant.  We do not advise e-cigarettes as a form of stopping smoking.  Patient is willing to quit smoking.  Has not set a quit date yet.  Is trying to stop cold [place].  I have advised the patient that we can assist and have options of nicotine replacement therapy, provided smoking cessation education today, provided smoking cessation counseling, and provided cessation resources.  Follow-up next office visit office visit for assessment of smoking cessation.  Smoking cessation counseling advised for: 50min    Outpatient Encounter Medications as of 12/25/2019  Medication Sig  . acetaminophen (TYLENOL) 500 MG tablet Take 1,000 mg by mouth every 6 (six) hours as needed for moderate pain or headache.  . albuterol (VENTOLIN HFA) 108 (90 Base) MCG/ACT inhaler INHALER 2 PUFFS EVERY 6 HOURS AS NEEDED FOR WHEEZING/SHORTNESS OF BREATH  . aspirin EC 81 MG tablet Take 81 mg by mouth every evening.   . clopidogrel (PLAVIX) 75 MG tablet Take 1 tablet (75 mg total) by mouth daily.  [name] dexlansoprazole (DEXILANT) 60 MG capsule TAKE 1 CAPSULE BY MOUTH EVERY DAY  . diphenhydrAMINE (BENADRYL) 25 mg capsule Take 2 capsules in AM and 1 capsule at night (Patient taking differently: Take 50 mg by mouth 2 (two) times daily as needed for itching or allergies. )  . fluticasone (FLONASE) 50 MCG/ACT nasal spray Place 2 sprays into both nostrils daily.  [name]  gabapentin (NEURONTIN) 300 MG capsule TAKE 2 CAPSULES BY MOUTH AT BEDTIME  . glimepiride (AMARYL) 2 MG tablet Take 2 mg by mouth daily. Only in the am  . glimepiride (AMARYL) 4 MG tablet Take 4 mg by mouth daily. Only in the pm  . guaiFENesin (MUCINEX) 600 MG 12 hr tablet Take 2 tablets (1,200 mg total) by mouth 2 (two) times daily.  . iron  polysaccharides (NU-IRON) 150 MG capsule Take 1 capsule (150 mg total) by mouth 2 (two) times daily.  . isosorbide mononitrate (IMDUR) 30 MG 24 hr tablet Take 1 tablet (30 mg total) by mouth daily.  [name] levETIRAcetam (KEPPRA) 1000 MG tablet TAKE 1 TABLET BY MOUTH TWICE A DAY  . metFORMIN (GLUCOPHAGE-XR) 500 MG 24 hr tablet Take 500 mg by mouth 2 (two) times daily.  . nicotine polacrilex (NICORETTE) 4 MG gum Take 1 each (4 mg total) by mouth as needed for smoking cessation.  . nitroGLYCERIN (NITROLINGUAL) 0.4 MG/SPRAY spray Place 1 spray under the tongue every 5 (five) minutes x 3 doses as needed for chest pain.  [name] nystatin (NYSTATIN) powder Apply topically 4 (four) times daily.  . ONE TOUCH ULTRA TEST test strip USE AS DIRECTED FOR TESTING BLOOD GLUCOSE 3 TIMES DAILY  . polyethylene glycol powder (GLYCOLAX/MIRALAX) powder TAKE 1 DOSE 17 GRAMS IN 8 OZ OF WATER DAILY AS NEEDED FOR CONSTIPATION. (Patient taking differently: Take 17 g by mouth daily. Take 1 dose 17 grams in 8 oz of water daily for constipation.)  . potassium chloride (KLOR-CON M10) 10 MEQ tablet Take 1 tablet (10 mEq total) by mouth 2 (two) times daily.  . promethazine (PHENERGAN) 25 MG tablet Take 1 tablet (25 mg total) by mouth every 8 (eight) hours as needed for nausea or vomiting. Caution of sedation  . ranolazine (RANEXA) 1000 MG SR tablet Take 1 tablet (1,000 mg total) by mouth 2 (two) times daily.  . rosuvastatin (CRESTOR) 10 MG tablet TAKE 1 TABLET BY MOUTH EVERY DAY  . sodium chloride (OCEAN) 0.65 % SOLN nasal spray Place 1 spray into both nostrils as needed for congestion.  . vitamin B-12 (CYANOCOBALAMIN) 1000 MCG tablet Take 1,000 mcg by mouth daily.  . vitamin C (ASCORBIC ACID) 250 MG tablet Take 1 tablet (250 mg total) by mouth daily.  . [DISCONTINUED] albuterol (VENTOLIN HFA) 108 (90 Base) MCG/ACT inhaler INHALER 2 PUFFS EVERY 6 HOURS AS NEEDED FOR WHEEZING/SHORTNESS OF BREATH  . [DISCONTINUED] fluticasone (FLONASE) 50  MCG/ACT nasal spray Place 2 sprays into both nostrils daily.  [name] azithromycin (ZITHROMAX) 250 MG tablet 500mg  (two tablets) today, then 250mg  (1 tablet) for the next 4 days  . furosemide (LASIX) 40 MG tablet Take 1 tablet (40 mg total) by mouth 2 (two) times daily.  . Tiotropium Bromide-Olodaterol (STIOLTO RESPIMAT) 2.5-2.5 MCG/ACT AERS Inhale 2 puffs into the lungs daily.   No facility-administered encounter medications on file as of 12/25/2019.     Review of Systems  Review of Systems  Constitutional: Negative for activity change, fatigue and fever.  HENT: Negative for sinus pressure, sinus pain and sore throat.   Respiratory: Positive for shortness of breath. Negative for cough and wheezing.   Cardiovascular: Positive for leg swelling. Negative for chest pain and palpitations.  Gastrointestinal: Negative for diarrhea, nausea and vomiting.  Musculoskeletal: Negative for arthralgias.  Neurological: Negative for dizziness.  Psychiatric/Behavioral: Negative for sleep disturbance. The patient is not nervous/anxious.      Physical Exam  BP 130/74 (BP Location: Left Arm,  Patient Position: Sitting, Cuff Size: Normal)   Pulse 96   Temp 97.7 F (36.5 C) (Temporal)   Ht 4\' 9"  (1.448 m)   Wt 138 lb 3.2 oz (62.7 kg)   LMP 09/02/2003   SpO2 98% Comment: on RA  BMI 29.91 kg/m   Wt Readings from Last 5 Encounters:  12/25/19 138 lb 3.2 oz (62.7 kg)  12/09/19 142 lb 9 oz (64.7 kg)  11/18/19 143 lb 4.8 oz (65 kg)  11/05/19 143 lb 11.2 oz (65.2 kg)  11/03/19 143 lb (64.9 kg)    BMI Readings from Last 5 Encounters:  12/25/19 29.91 kg/m  12/09/19 30.85 kg/m  11/18/19 31.01 kg/m  11/05/19 31.10 kg/m  11/03/19 30.94 kg/m     Physical Exam Vitals and nursing note reviewed.  Constitutional:      General: She is not in acute distress.    Appearance: Normal appearance. She is normal weight.  HENT:     Head: Normocephalic and atraumatic.     Right Ear: Tympanic membrane, ear canal  and external ear normal. There is no impacted cerumen.     Left Ear: Tympanic membrane, ear canal and external ear normal. There is no impacted cerumen.     Nose: Nose normal. No congestion.     Mouth/Throat:     Mouth: Mucous membranes are moist.     Pharynx: Oropharynx is clear.  Eyes:     Pupils: Pupils are equal, round, and reactive to light.  Cardiovascular:     Rate and Rhythm: Normal rate and regular rhythm.     Pulses: Normal pulses.     Heart sounds: Normal heart sounds. No murmur.  Pulmonary:     Breath sounds: No decreased air movement. Rhonchi (Right lower lobe) present. No decreased breath sounds, wheezing or rales.  Musculoskeletal:     Cervical back: Normal range of motion.     Right lower leg: Edema (1+) present.     Left lower leg: Edema (1+) present.  Skin:    General: Skin is warm and dry.     Capillary Refill: Capillary refill takes less than 2 seconds.  Neurological:     General: No focal deficit present.     Mental Status: She is alert and oriented to person, place, and time. Mental status is at baseline.     Gait: Gait abnormal (Walks with walker).  Psychiatric:        Mood and Affect: Mood normal.        Behavior: Behavior normal.        Thought Content: Thought content normal.        Judgment: Judgment normal.       Assessment & Plan:   Chronic systolic congestive heart failure (HCC) Worsening echo Referred back to CHF clinic  Plan: Complete follow-up with CHF clinic next month Continue diuretics Continue to weigh yourself daily Follow low-sodium diet  Chronic respiratory failure ([place]) Plan: We will continue to clinically monitor  Healthcare maintenance Help patient coordinate COVID-19 vaccine appointment Paperwork provided for the patient  Shortness of breath This is likely multifactorial and chronic given her worsening CHF, anemia, ongoing smoking  Plan: Chest x-ray today Emphasized importance of stopping smoking We will treat  patient with Z-Pak today   TOBACCO USE 07/2019-lung RADS 2 Ordered repeat lung cancer screening in 07/2020 Current smoker, working on decreasing smoking Has gone down from 2 packs to 0.5 packs  Plan: Congratulated patient on her success Encouraged her to continue to work  on reducing her smoking.  Her hope is to be finished smoking by the time she sees [place] in 2 months Continue with the lung cancer screening program    Return in about 2 months (around 02/24/2020), or if symptoms worsen or fail to improve, for Follow up with Dr. Halford Chessman, Follow up with Wyn Quaker FNP-C.   Lauraine Rinne, NP 12/25/2019   This appointment required 32 minutes of patient care (this includes precharting, chart review, review of results, face-to-face care, etc.).

## 2019-12-25 NOTE — Progress Notes (Signed)
Reviewed and agree with assessment/plan.   Shenia Alan, MD [facility] 09/26/2016, 12:24 PM Pager:  [phone]  

## 2019-12-25 NOTE — Assessment & Plan Note (Signed)
This is likely multifactorial and chronic given her worsening CHF, anemia, ongoing smoking  Plan: Chest x-ray today Emphasized importance of stopping smoking We will treat patient with Z-Pak today

## 2019-12-25 NOTE — Assessment & Plan Note (Signed)
Plan: We will continue to clinically monitor

## 2019-12-25 NOTE — Progress Notes (Signed)
Chest x-ray showing some fluid retention.  Patient should contact Dr. Doug Sou office next week to see if they would like for her to increase her Lasix based off these imaging findings and her increased shortness of breath prior to getting seen by the CHF clinic.Wyn Quaker, FNP

## 2019-12-28 ENCOUNTER — Telehealth: Payer: Self-pay | Admitting: Cardiology

## 2019-12-28 ENCOUNTER — Other Ambulatory Visit: Payer: Self-pay | Admitting: *Deleted

## 2019-12-28 DIAGNOSIS — I5022 Chronic systolic (congestive) heart failure: Secondary | ICD-10-CM

## 2019-12-28 DIAGNOSIS — I10 Essential (primary) hypertension: Secondary | ICD-10-CM

## 2019-12-28 MED ORDER — METOLAZONE 2.5 MG PO TABS: ORAL_TABLET | ORAL | 3 refills | Status: DC

## 2019-12-28 NOTE — Progress Notes (Unsigned)
Patient requested orders for BMP on behalf of Dr. Martinique to be drawn tomorrow.

## 2019-12-28 NOTE — Telephone Encounter (Signed)
Contacted patient, advised that I would send message to [facility] and his nurse regarding the increase in lasix-  Chest xray message from NP: Chest x-ray showing some fluid retention.  Patient should contact Dr. Doug Sou office next week to see if they would like for her to increase her Lasix based off these imaging findings and her increased shortness of breath prior to getting seen by the CHF clinic.  Wyn Quaker, FNP  Advised patient that I would call back with recommendations from [place]. Patient verbalized understanding, thankful for call.

## 2019-12-28 NOTE — Telephone Encounter (Signed)
Patient states she went to pulmonary and had an x-ray done. She states it's showing fluid retention in her chest and they wanted to know if Dr. Martinique wanted her to increase her fluid medication.

## 2019-12-28 NOTE — Telephone Encounter (Signed)
Spoke to patient Dr.Jordan's recommendation given.Stated she will have b[facility] on [facility].Advised to have results sent to [place].

## 2019-12-28 NOTE — Progress Notes (Signed)
Patient identification verified. Patient provided results from recent x ray. Per Wyn Quaker NP, chest x ray is showing some fluid retention. Patient should contact Dr. Doug Sou office this week to see if they would like for her to increase her Lasix based off these imaging findings and her increased shortness of breath. Please contact Dr. Martinique prior to being seen by the CHF clinic. Patient verbalized understanding of results and plans to contact Dr. Martinique today.

## 2019-12-28 NOTE — Telephone Encounter (Signed)
I would recommend continuing lasix 40 mg bid and starting metolazone 2.5 mg every other day. Will need a BMET on Friday. Has an appt with Advanced heart failure in April.   Janiyha Montufar Martinique MD, [facility]

## 2019-12-29 ENCOUNTER — Other Ambulatory Visit: Payer: Self-pay | Admitting: Physician Assistant

## 2019-12-29 ENCOUNTER — Other Ambulatory Visit: Payer: Self-pay | Admitting: *Deleted

## 2019-12-29 DIAGNOSIS — Z23 Encounter for immunization: Secondary | ICD-10-CM | POA: Diagnosis not present

## 2019-12-29 DIAGNOSIS — D509 Iron deficiency anemia, unspecified: Secondary | ICD-10-CM

## 2019-12-30 ENCOUNTER — Other Ambulatory Visit: Payer: Self-pay | Admitting: Pulmonary Disease

## 2019-12-30 DIAGNOSIS — J9611 Chronic respiratory failure with hypoxia: Secondary | ICD-10-CM

## 2019-12-30 NOTE — Progress Notes (Signed)
[facility] OFFICE PROGRESS NOTE  Tower, [name], MD [place] [id number]  DIAGNOSIS: Iron Deficiency Anemia  PRIOR THERAPY: None  CURRENT THERAPY:  1) Venofer infusions as needed. Last dose on 12/03/19  2) Oral iron supplement daily.   INTERVAL HISTORY: Diane [name] 64 y.o. female returns the clinic for a follow up visit. She received 4 weekly venofer infusions, her last being on 3/4. She tolerated it well. She stopped taking her oral supplement because she did not realize she needed to continue taking it while receiving IV iron. She did not notice significant change in her symptoms since receiving iron. Today, she reports continued fatigue. She also reports dyspnea on exertion which is likely multifactorial with her anemia, CHF, COPD, etc. She plans to be seen by a the heart failure clinic in a few weeks. She denies any chest pain, headaches, or dizziness. Denies any bleeding or bruising. Her last endoscopy was in Spring 2020 and her last colonoscopy was in 2018. Both of which were unremarkable. She had FOBT testing that was negative last month. She reports easy bruising secondary to her Plavix and aspirin use.  She denies any epistaxis, gingival bleeding, hemoptysis, hematuria, or hematemesis.  She denies any hematochezia.  She denies any vaginal bleeding.  The patient is not a vegan or vegetarian. She had a hemicolectomy in the past in the past. She is here for evaluation and repeat iron studies.    MEDICAL HISTORY: Past Medical History:  Diagnosis Date  . Anemia   . Carotid stenosis    a. s/p right carotid stent 10/2017.  [name] Chronic systolic CHF (congestive heart failure) ([place])   . Colon polyps   . COPD (chronic obstructive pulmonary disease) ([name])   . Coronary artery disease    post CABG in 3/07 , coronary stents   . Diabetes mellitus    type 2  . Dyslipidemia   . Fibromyalgia   . GERD (gastroesophageal reflux disease)   . Headache    hx of    . Hyperlipidemia   . Hypertension   . Myocardial infarction ([place])   . Pneumonia    hx of   . Seizures ([place])    last seizure- 03/2013   . Shortness of breath dyspnea    with exertion or when fluid builds up   . Sleep apnea    used to wear a cpap- not used in 3 years   . Status post dilation of esophageal narrowing   . Stroke [facility]) 1993   problems with balance   . Systolic murmur    known mild AS and MR  . Thyroid goiter   . Tobacco abuse     ALLERGIES:  is allergic to bee venom; benadryl [diphenhydramine]; entresto [sacubitril-valsartan]; tetracycline; and ace inhibitors.  MEDICATIONS:  Current Outpatient Medications  Medication Sig Dispense Refill  . acetaminophen (TYLENOL) 500 MG tablet Take 1,000 mg by mouth every 6 (six) hours as needed for moderate pain or headache.    . albuterol (VENTOLIN HFA) 108 (90 Base) MCG/ACT inhaler INHALER 2 PUFFS EVERY 6 HOURS AS NEEDED FOR WHEEZING/SHORTNESS OF BREATH 18 g 0  . aspirin EC 81 MG tablet Take 81 mg by mouth every evening.  30 tablet 6  . azithromycin (ZITHROMAX) 250 MG tablet 500mg  (two tablets) today, then 250mg  (1 tablet) for the next 4 days 6 tablet 0  . clopidogrel (PLAVIX) 75 MG tablet Take 1 tablet (75 mg total) by mouth  daily. 90 tablet 3  . dexlansoprazole (DEXILANT) 60 MG capsule TAKE 1 CAPSULE BY MOUTH EVERY DAY 90 capsule 3  . diphenhydrAMINE (BENADRYL) 25 mg capsule Take 2 capsules in AM and 1 capsule at night (Patient taking differently: Take 50 mg by mouth 2 (two) times daily as needed for itching or allergies. ) 270 capsule 3  . fluticasone (FLONASE) 50 MCG/ACT nasal spray Place 2 sprays into both nostrils daily. 48 g 1  . gabapentin (NEURONTIN) 300 MG capsule TAKE 2 CAPSULES BY MOUTH AT BEDTIME 180 capsule 1  . glimepiride (AMARYL) 2 MG tablet Take 2 mg by mouth daily. Only in the am    . glimepiride (AMARYL) 4 MG tablet Take 4 mg by mouth daily. Only in the pm    . guaiFENesin (MUCINEX) 600 MG 12 hr tablet Take 2  tablets (1,200 mg total) by mouth 2 (two) times daily. 360 tablet 3  . isosorbide mononitrate (IMDUR) 30 MG 24 hr tablet Take 1 tablet (30 mg total) by mouth daily. 90 tablet 3  . levETIRAcetam (KEPPRA) 1000 MG tablet TAKE 1 TABLET BY MOUTH TWICE A DAY 180 tablet 3  . metFORMIN (GLUCOPHAGE-XR) 500 MG 24 hr tablet Take 500 mg by mouth 2 (two) times daily.  6  . metolazone (ZAROXOLYN) 2.5 MG tablet Take 2.5 mg 30 mins before am dose of lasix every other morning 45 tablet 3  . nicotine polacrilex (NICORETTE) 4 MG gum Take 1 each (4 mg total) by mouth as needed for smoking cessation. 90 tablet 2  . nystatin (NYSTATIN) powder Apply topically 4 (four) times daily. 15 g 3  . ONE TOUCH ULTRA TEST test strip USE AS DIRECTED FOR TESTING BLOOD GLUCOSE 3 TIMES DAILY  1  . polyethylene glycol powder (GLYCOLAX/MIRALAX) powder TAKE 1 DOSE 17 GRAMS IN 8 OZ OF WATER DAILY AS NEEDED FOR CONSTIPATION. (Patient taking differently: Take 17 g by mouth daily. Take 1 dose 17 grams in 8 oz of water daily for constipation.) 527 g 3  . potassium chloride (KLOR-CON M10) 10 MEQ tablet Take 1 tablet (10 mEq total) by mouth 2 (two) times daily. 180 tablet 3  . promethazine (PHENERGAN) 25 MG tablet Take 1 tablet (25 mg total) by mouth every 8 (eight) hours as needed for nausea or vomiting. Caution of sedation 30 tablet 1  . ranolazine (RANEXA) 1000 MG SR tablet Take 1 tablet (1,000 mg total) by mouth 2 (two) times daily. 180 tablet 3  . rosuvastatin (CRESTOR) 10 MG tablet TAKE 1 TABLET BY MOUTH EVERY DAY 90 tablet 1  . sodium chloride (OCEAN) 0.65 % SOLN nasal spray Place 1 spray into both nostrils as needed for congestion.    . Tiotropium Bromide-Olodaterol (STIOLTO RESPIMAT) 2.5-2.5 MCG/ACT AERS Inhale 2 puffs into the lungs daily. 8 g 0  . vitamin B-12 (CYANOCOBALAMIN) 1000 MCG tablet Take 1,000 mcg by mouth daily.    . vitamin C (ASCORBIC ACID) 250 MG tablet Take 1 tablet (250 mg total) by mouth daily. 90 tablet 3  .  furosemide (LASIX) 40 MG tablet Take 1 tablet (40 mg total) by mouth 2 (two) times daily. 180 tablet 3  . iron polysaccharides (NU-IRON) 150 MG capsule Take 1 capsule (150 mg total) by mouth 2 (two) times daily. (Patient not taking: Reported on 12/31/2019) 180 capsule 3  . nitroGLYCERIN (NITROLINGUAL) 0.4 MG/SPRAY spray Place 1 spray under the tongue every 5 (five) minutes x 3 doses as needed for chest pain. (Patient not taking: Reported  on 12/31/2019) 12 g 3   No current facility-administered medications for this visit.    SURGICAL HISTORY:  Past Surgical History:  Procedure Laterality Date  . APPENDECTOMY    . BIOPSY  02/03/2019   Procedure: BIOPSY;  Surgeon: Ladene Artist, MD;  Location: [facility];  Service: Endoscopy;;  . BREAST BIOPSY Left 2016  . CARDIAC CATHETERIZATION  11/29/05   EF of 55%  . CARDIAC CATHETERIZATION  08/06/06   EF of 45-50%  . CARDIAC CATHETERIZATION N/A 05/06/2015   Procedure: Right/Left Heart Cath and Coronary/Graft Angiography;  Surgeon: Peter M Martinique, MD;  Location: [facility];  Service: Cardiovascular;  Laterality: N/A;  . CAROTID PTA/STENT INTERVENTION N/A 10/10/2017   Procedure: CAROTID PTA/STENT INTERVENTION - Right;  Surgeon: Serafina Mitchell, MD;  Location: [facility];  Service: Cardiovascular;  Laterality: N/A;  . CERVICAL FUSION  1990  . CHOLECYSTECTOMY    . COLON RESECTION     mass removed and 4 in of colon  . COLONOSCOPY WITH PROPOFOL N/A 07/16/2017   Procedure: COLONOSCOPY WITH PROPOFOL;  Surgeon: Ladene Artist, MD;  Location: [facility];  Service: Endoscopy;  Laterality: N/A;  . CORONARY ARTERY BYPASS GRAFT  12/04/2005   x5 -- left internal mammary artery to the LAD, left radial artery to the ramus intermedius, saphenous vein graft to the obtuse marginal 1, sequential saphenous vein grat to the acute marginal and posterior descending, endoscopic vein harvesting from the left thigh with open vein harvest from right leg  . CORONARY  STENT PLACEMENT  08/11/06   PCI of her ciurcumflex/OM vessel  . ESOPHAGOGASTRODUODENOSCOPY (EGD) WITH PROPOFOL N/A 02/03/2019   Procedure: ESOPHAGOGASTRODUODENOSCOPY (EGD) WITH PROPOFOL;  Surgeon: Ladene Artist, MD;  Location: [facility];  Service: Endoscopy;  Laterality: N/A;  . LAPAROTOMY Bilateral 05/19/2015   Procedure: EXPLORATORY LAPAROTOMY WITH BILATERAL SALPINGO OOPHORECTOMY /OMENTECTOMY/SEGMENTAL SIGMOID COLECTOMY ;  Surgeon: Everitt Amber, MD;  Location: [facility];  Service: Gynecology;  Laterality: Bilateral;  . PERIPHERAL VASCULAR CATHETERIZATION N/A 11/15/2015   Procedure: Carotid PTA/Stent Intervention;  Surgeon: Lorretta Harp, MD;  Location: [facility];  Service: Cardiovascular;  Laterality: N/A;  . PERIPHERAL VASCULAR CATHETERIZATION  11/15/2015   Procedure: Carotid Angiography;  Surgeon: Lorretta Harp, MD;  Location: [facility];  Service: Cardiovascular;;    REVIEW OF SYSTEMS:   Review of Systems  Constitutional: Positive for fatigue. Negative for appetite change, chills, fever and unexpected weight change.  HENT:  Negative for mouth sores, nosebleeds, sore throat and trouble swallowing.   Eyes: Negative for eye problems and icterus.   and wheezing.  Cardiovascular: Negative for chest pain and leg swelling.  Gastrointestinal: Negative for abdominal pain, constipation, diarrhea, nausea and vomiting.  Genitourinary: Negative for bladder incontinence, difficulty urinating, dysuria, frequency and hematuria.   Musculoskeletal: Negative for back pain, gait problem, neck pain and neck stiffness.  Skin: Negative for itching and rash.  Neurological: Negative for dizziness, extremity weakness, gait problem, headaches, light-headedness and seizures.  Hematological: Positive for easy bruising on extremities due to blood thinner. Negative for adenopathy. .  Psychiatric/Behavioral: Negative for confusion, depression and sleep disturbance. The patient is not  nervous/anxious.     PHYSICAL EXAMINATION:  Blood pressure 101/63, pulse 85, temperature 97.9 F (36.6 C), temperature source Oral, resp. rate 18, height 4\' 9"  (1.448 m), weight 132 lb 8 oz (60.1 kg), last menstrual period 09/02/2003, SpO2 100 %.  ECOG PERFORMANCE STATUS: 1 - Symptomatic but completely ambulatory  Physical Exam  Constitutional: Oriented to person, place, and time and well-developed, well-nourished, and in no distress.  HENT:  Head: Normocephalic and atraumatic.  Mouth/Throat: Oropharynx is clear and moist. No oropharyngeal exudate.  Eyes: Conjunctivae are normal. Right eye exhibits no discharge. Left eye exhibits no discharge. No scleral icterus.  Neck: Normal range of motion. Neck supple.  Cardiovascular: Normal rate, regular rhythm, normal heart sounds and intact distal pulses.   Pulmonary/Chest: Effort normal and breath sounds normal. No respiratory distress. No wheezes. No rales.  Abdominal: Soft. Bowel sounds are normal. Exhibits no distension and no mass. There is no tenderness.  Musculoskeletal: Normal range of motion. Exhibits no edema.  Lymphadenopathy:    No cervical adenopathy.  Neurological: Alert and oriented to person, place, and time. Exhibits normal muscle tone. Gait normal. Coordination normal.  Skin: Positive for extremity bruising. Skin is warm and dry. No rash noted. Not diaphoretic. No erythema. No pallor.  Psychiatric: Mood, memory and judgment normal.  Vitals reviewed.  LABORATORY DATA: Lab Results  Component Value Date   WBC 4.9 12/31/2019   HGB 11.3 (L) 12/31/2019   HCT 38.7 12/31/2019   MCV 82.5 12/31/2019   PLT 201 12/31/2019      Chemistry      Component Value Date/Time   NA 137 12/31/2019 1329   NA 135 (L) 02/15/2017 1457   K 3.7 12/31/2019 1329   K 4.8 02/15/2017 1457   CL 99 12/31/2019 1329   CO2 26 12/31/2019 1329   CO2 26 02/15/2017 1457   BUN 16 12/31/2019 1329   BUN 10.8 02/15/2017 1457   CREATININE 0.78 12/31/2019  1329   CREATININE 0.69 05/06/2017 1405   CREATININE 0.7 02/15/2017 1457      Component Value Date/Time   CALCIUM 9.2 12/31/2019 1329   CALCIUM 9.3 02/15/2017 1457   ALKPHOS 101 11/05/2019 1257   ALKPHOS 75 02/15/2017 1457   AST 13 (L) 11/05/2019 1257   AST 9 02/15/2017 1457   ALT 9 11/05/2019 1257   ALT 13 02/15/2017 1457   BILITOT 0.4 11/05/2019 1257   BILITOT 0.28 02/15/2017 1457       RADIOGRAPHIC STUDIES:  DG Chest 2 View  Result Date: 12/25/2019 CLINICAL DATA:  SOB evaluation. EXAM: CHEST - 2 VIEW COMPARISON:  06/09/2018 FINDINGS: Prominent interstitial markings diffusely. Can not exclude mild central pulmonary vascular congestion. Cardiomegaly. Previous CABG and coronary stenting. Aortic Atherosclerosis (ICD10-170.0). No effusion. No pneumothorax. Cerclage wires noted in the lower cervical spine posteriorly. IMPRESSION: Cardiomegaly with possible mild central pulmonary vascular congestion. Electronically Signed   By: Lucrezia Europe M.D.   On: 12/25/2019 16:22     ASSESSMENT/PLAN:  This is a very pleasant 64 year old Caucasian female with iron deficiency anemia.   The patient was seen with Dr. Julien Nordmann today. She had a repeat CBC, iron studies, and ferritin. Her CBC showed improved Hbg and MCV. Hbg is 11.3 today. MCV WNL now. Iron studies show continued low iron with total iron of 31, elevated TIBC 598, saturation ratio 5, and UIBC 567.   Dr. Julien Nordmann recommends that we arrange for 4 more weekly doses of venofer starting next week.  The patient was advised to continue to take her oral iron supplement.   We will see her back for a follow up visit for evaluation and repeat cbc, iron studies, and ferritin in 3 months.   The patient was advised to call immediately if she has any concerning symptoms in the interval. The patient voices  understanding of current disease status and treatment options and is in agreement with the current care plan. All questions were answered. The patient  knows to call the clinic with any problems, questions or concerns. We can certainly see the patient much sooner if necessary    No orders of the defined types were placed in this encounter.    Diane Varnum L Macarthur Lorusso, PA-C 12/31/19  ADDENDUM: Hematology/Oncology Attending: I had a face-to-face encounter with the patient today.  I recommended her care plan.  This is a very pleasant 64 years old white female with iron deficiency anemia secondary to malabsorption.  The patient received iron infusion with Venofer x4 doses in the past.  She had improvement in her hemoglobin and hematocrit as well as improvement in the iron study but not complete resolution of the iron deficiency. I recommended for her to continue with the oral iron tablets for now but we will arrange for the patient to receive 4 doses of Venofer infusion again. We will see her back for follow-up visit in 3 months for evaluation and repeat CBC, iron study and ferritin. She was advised to call immediately if she has any other concerning symptoms in the interval.  Disclaimer: This note was dictated with voice recognition software. Similar sounding words can inadvertently be transcribed and may be missed upon review. Eilleen Kempf, MD 12/31/19

## 2019-12-31 ENCOUNTER — Inpatient Hospital Stay: Payer: Medicare Other | Attending: Physician Assistant

## 2019-12-31 ENCOUNTER — Inpatient Hospital Stay (HOSPITAL_BASED_OUTPATIENT_CLINIC_OR_DEPARTMENT_OTHER): Payer: Medicare Other | Admitting: Physician Assistant

## 2019-12-31 ENCOUNTER — Telehealth: Payer: Self-pay

## 2019-12-31 ENCOUNTER — Other Ambulatory Visit: Payer: Self-pay

## 2019-12-31 ENCOUNTER — Encounter: Payer: Self-pay | Admitting: Physician Assistant

## 2019-12-31 VITALS — BP 101/63 | HR 85 | Temp 97.9°F | Resp 18 | Ht <= 58 in | Wt 132.5 lb

## 2019-12-31 DIAGNOSIS — D509 Iron deficiency anemia, unspecified: Secondary | ICD-10-CM | POA: Diagnosis not present

## 2019-12-31 DIAGNOSIS — I255 Ischemic cardiomyopathy: Secondary | ICD-10-CM

## 2019-12-31 LAB — CBC WITH DIFFERENTIAL (CANCER CENTER ONLY)
Abs Immature Granulocytes: 0.01 10*3/uL (ref 0.00–0.07)
Basophils Absolute: 0 10*3/uL (ref 0.0–0.1)
Basophils Relative: 1 %
Eosinophils Absolute: 0.1 10*3/uL (ref 0.0–0.5)
Eosinophils Relative: 1 %
HCT: 38.7 % (ref 36.0–46.0)
Hemoglobin: 11.3 g/dL — ABNORMAL LOW (ref 12.0–15.0)
Immature Granulocytes: 0 %
Lymphocytes Relative: 17 %
Lymphs Abs: 0.8 10*3/uL (ref 0.7–4.0)
MCH: 24.1 pg — ABNORMAL LOW (ref 26.0–34.0)
MCHC: 29.2 g/dL — ABNORMAL LOW (ref 30.0–36.0)
MCV: 82.5 fL (ref 80.0–100.0)
Monocytes Absolute: 0.5 10*3/uL (ref 0.1–1.0)
Monocytes Relative: 10 %
Neutro Abs: 3.5 10*3/uL (ref 1.7–7.7)
Neutrophils Relative %: 71 %
Platelet Count: 201 10*3/uL (ref 150–400)
RBC: 4.69 MIL/uL (ref 3.87–5.11)
RDW: 23.6 % — ABNORMAL HIGH (ref 11.5–15.5)
WBC Count: 4.9 10*3/uL (ref 4.0–10.5)
nRBC: 0 % (ref 0.0–0.2)

## 2019-12-31 LAB — BASIC METABOLIC PANEL - CANCER CENTER ONLY
Anion gap: 12 (ref 5–15)
BUN: 16 mg/dL (ref 8–23)
CO2: 26 mmol/L (ref 22–32)
Calcium: 9.2 mg/dL (ref 8.9–10.3)
Chloride: 99 mmol/L (ref 98–111)
Creatinine: 0.78 mg/dL (ref 0.44–1.00)
GFR, Est AFR Am: >60 mL/min (ref >60)
GFR, Estimated: >60 mL/min (ref >60)
Glucose, Bld: 208 mg/dL — ABNORMAL HIGH (ref 70–99)
Potassium: 3.7 mmol/L (ref 3.5–5.1)
Sodium: 137 mmol/L (ref 135–145)

## 2019-12-31 LAB — IRON AND TIBC
Iron: 31 ug/dL — ABNORMAL LOW (ref 41–142)
Saturation Ratios: 5 % — ABNORMAL LOW (ref 21–57)
TIBC: 598 ug/dL — ABNORMAL HIGH (ref 236–444)
UIBC: 567 ug/dL — ABNORMAL HIGH (ref 120–384)

## 2019-12-31 MED ORDER — POTASSIUM CHLORIDE CRYS ER 20 MEQ PO TBCR: EXTENDED_RELEASE_TABLET | ORAL | 3 refills | Status: DC

## 2019-12-31 NOTE — Telephone Encounter (Signed)
Spoke to patient Dr.Jordan reviewed bmet he advised to increase Klor Con to 20 meq twice a day.New prescription sent to mail order pharmacy.

## 2020-01-01 ENCOUNTER — Telehealth: Payer: Self-pay | Admitting: Physician Assistant

## 2020-01-01 LAB — FERRITIN: Ferritin: 29 ng/mL (ref 11–307)

## 2020-01-01 NOTE — Telephone Encounter (Signed)
Scheduled per los. Called and left msg. Mailed printout  °

## 2020-01-02 ENCOUNTER — Telehealth: Payer: Self-pay

## 2020-01-02 DIAGNOSIS — I5022 Chronic systolic (congestive) heart failure: Secondary | ICD-10-CM

## 2020-01-02 DIAGNOSIS — I10 Essential (primary) hypertension: Secondary | ICD-10-CM

## 2020-01-02 NOTE — Telephone Encounter (Signed)
I would like to request a referral for Diane [name] to chronic care management pharmacy services for the following conditions:   Essential hypertension, benign  [T90]  Chronic systolic congestive heart failure [I50.22]  Debbora Dus, PharmD Clinical Pharmacist [facility] at [facility] [phone]

## 2020-01-04 ENCOUNTER — Telehealth: Payer: Self-pay | Admitting: Family Medicine

## 2020-01-04 NOTE — Telephone Encounter (Signed)
Diane [name] with [facility] calling for a referral for a bedside table for the patient -- needs to go to [facility] Pt recently got a hospital bed and is needing this referral sent.   Please advise, thanks.

## 2020-01-04 NOTE — Telephone Encounter (Signed)
Referral placed.

## 2020-01-04 NOTE — Telephone Encounter (Signed)
See 12/16/19 phone note. Pt called in already requesting this order and it was sent in already to them, called Almyra Free and no answer so left her a VM letting her know pt called in as requested this already and we have already sent that order to [facility] but if they didn't receive it to call us back and let us know but she may want to have pt check in with them regarding table

## 2020-01-06 ENCOUNTER — Telehealth: Payer: Medicare Other

## 2020-01-06 ENCOUNTER — Telehealth: Payer: Self-pay

## 2020-01-06 NOTE — Chronic Care Management (AMB) (Deleted)
Chronic Care Management Pharmacy  Name: Diane [name]  MRN: [id number] DOB: May 31, 1956  Chief Complaint/ HPI  Diane [name],  64 y.o. , female presents for their Initial CCM visit with the clinical pharmacist via telephone.  PCP : Diane Greenspan, MD  Their chronic conditions include: hypertension, hyperlipidemia, heart failure, chronic respiratory disease, diabetes, migraine, allergies, GERD, seizures, iron deficiency, B12 deficiency  Patient concerns:  Current goals: quit smoking, exercise  Office Visits:   12/09/19: Tower - nausea, complicated by CHF, polypharmacy, GERD, would benefit from GI referral but declines, try Zofran 8 mg as 4 mg not effective, if ineffective, switch to phenergan   12/04/19: Diane [name] - try Pepcid qhs   11/02/19: Tower - refer to hematologist for IDA  Consult Visit:  12/31/19: Iron deficiency anemia - continue oral iron, rtc 3 months   12/25/19: Pulmonology - cut down to 1/2 PPD, worsening echo, referred back to CHF clinic, zpak today, chest-x-ray  11/03/19: Cardiology - continue DAPT, repeat echo  Allergies  Allergen Reactions  . Bee Venom Itching and Swelling  . Benadryl [Diphenhydramine] Other (See Comments)    Benadryl makes pt feel jittery/unable to sleep, recommend decreasing dose amount if able  . Entresto [Sacubitril-Valsartan] Cough  . Tetracycline     Unknown, pt cannot recall exact reaction  . Ace Inhibitors Cough    Medications: Outpatient Encounter Medications as of 01/06/2020  Medication Sig  . acetaminophen (TYLENOL) 500 MG tablet Take 1,000 mg by mouth every 6 (six) hours as needed for moderate pain or headache.  . albuterol (VENTOLIN HFA) 108 (90 Base) MCG/ACT inhaler INHALER 2 PUFFS EVERY 6 HOURS AS NEEDED FOR WHEEZING/SHORTNESS OF BREATH  . aspirin EC 81 MG tablet Take 81 mg by mouth every evening.   [name] azithromycin (ZITHROMAX) 250 MG tablet 515m (two tablets) today, then 2573m(1 tablet) for the next 4 days  . clopidogrel  (PLAVIX) 75 MG tablet Take 1 tablet (75 mg total) by mouth daily.  . Marland Kitchenexlansoprazole (DEXILANT) 60 MG capsule TAKE 1 CAPSULE BY MOUTH EVERY DAY  . diphenhydrAMINE (BENADRYL) 25 mg capsule Take 2 capsules in AM and 1 capsule at night (Patient taking differently: Take 50 mg by mouth 2 (two) times daily as needed for itching or allergies. )  . fluticasone (FLONASE) 50 MCG/ACT nasal spray Place 2 sprays into both nostrils daily.  . furosemide (LASIX) 40 MG tablet Take 1 tablet (40 mg total) by mouth 2 (two) times daily.  . Marland Kitchenabapentin (NEURONTIN) 300 MG capsule TAKE 2 CAPSULES BY MOUTH AT BEDTIME  . glimepiride (AMARYL) 2 MG tablet Take 2 mg by mouth daily. Only in the am  . glimepiride (AMARYL) 4 MG tablet Take 4 mg by mouth daily. Only in the pm  . guaiFENesin (MUCINEX) 600 MG 12 hr tablet Take 2 tablets (1,200 mg total) by mouth 2 (two) times daily.  . iron polysaccharides (NU-IRON) 150 MG capsule Take 1 capsule (150 mg total) by mouth 2 (two) times daily. (Patient not taking: Reported on 12/31/2019)  . isosorbide mononitrate (IMDUR) 30 MG 24 hr tablet Take 1 tablet (30 mg total) by mouth daily.  . Marland KitchenevETIRAcetam (KEPPRA) 1000 MG tablet TAKE 1 TABLET BY MOUTH TWICE A DAY  . metFORMIN (GLUCOPHAGE-XR) 500 MG 24 hr tablet Take 500 mg by mouth 2 (two) times daily.  . metolazone (ZAROXOLYN) 2.5 MG tablet Take 2.5 mg 30 mins before am dose of lasix every other morning  . nicotine polacrilex (NICORETTE) 4 MG gum  Take 1 each (4 mg total) by mouth as needed for smoking cessation.  . nitroGLYCERIN (NITROLINGUAL) 0.4 MG/SPRAY spray Place 1 spray under the tongue every 5 (five) minutes x 3 doses as needed for chest pain. (Patient not taking: Reported on 12/31/2019)  . nystatin (NYSTATIN) powder Apply topically 4 (four) times daily.  . ONE TOUCH ULTRA TEST test strip USE AS DIRECTED FOR TESTING BLOOD GLUCOSE 3 TIMES DAILY  . polyethylene glycol powder (GLYCOLAX/MIRALAX) powder TAKE 1 DOSE 17 GRAMS IN 8 OZ OF WATER  DAILY AS NEEDED FOR CONSTIPATION. (Patient taking differently: Take 17 g by mouth daily. Take 1 dose 17 grams in 8 oz of water daily for constipation.)  . potassium chloride SA (KLOR-CON) 20 MEQ tablet Take 20 meq twice a day  . promethazine (PHENERGAN) 25 MG tablet Take 1 tablet (25 mg total) by mouth every 8 (eight) hours as needed for nausea or vomiting. Caution of sedation  . ranolazine (RANEXA) 1000 MG SR tablet Take 1 tablet (1,000 mg total) by mouth 2 (two) times daily.  . rosuvastatin (CRESTOR) 10 MG tablet TAKE 1 TABLET BY MOUTH EVERY DAY  . sodium chloride (OCEAN) 0.65 % SOLN nasal spray Place 1 spray into both nostrils as needed for congestion.  . Tiotropium Bromide-Olodaterol (STIOLTO RESPIMAT) 2.5-2.5 MCG/ACT AERS Inhale 2 puffs into the lungs daily.  . vitamin B-12 (CYANOCOBALAMIN) 1000 MCG tablet Take 1,000 mcg by mouth daily.  . vitamin C (ASCORBIC ACID) 250 MG tablet Take 1 tablet (250 mg total) by mouth daily.   No facility-administered encounter medications on file as of 01/06/2020.   Current Diagnosis/Assessment:  Hypertension   CMP Latest Ref Rng & Units 12/31/2019 11/05/2019 04/21/2019  Glucose 70 - 99 mg/dL 208(H) 280(H) 183(H)  BUN 8 - 23 mg/dL _0 Creatinine 0.44 - 1.00 mg/dL 0.78 0.82 0.80  Sodium 135 - 145 mmol/L 137 136 135  Potassium 3.5 - 5.1 mmol/L 3.7 4.5 5.1  Chloride 98 - 111 mmol/L 99 101 101  CO2 22 - 32 mmol/L _1 Calcium 8.9 - 10.3 mg/dL 9.2 8.7(L) 9.4  Total Protein 6.5 - 8.1 g/dL - 6.5 6.6  Total Bilirubin 0.3 - 1.2 mg/dL - 0.4 0.3  Alkaline Phos 38 - 126 U/L - 101 66  AST 15 - 41 U/L - 13(L) 11  ALT 0 - 44 U/L - 9 10   Office blood pressures are: BP Readings from Last 3 Encounters:  12/31/19 101/63  12/25/19 130/74  12/09/19 112/76   BP today is:  {CHL HP UPSTREAM Pharmacist BP ranges:[phone]}  Sleep apnea:  Patient has failed these meds in the past:  Patient checks BP at home {CHL HP BP Monitoring  Frequency:[phone]} Patient home BP readings are ranging:   Patient is currently {CHL Controlled/[phone]} on the following medications:  Isosorbide Mononitrate 30 mg - 1 tablet daily  We discussed:  {CHL HP Upstream Pharmacy discussion:[phone]}  Plan: Continue {CHL HP Upstream Pharmacy [phone]}  Hyperlipidemia/CAD   Lipid Panel     Component Value Date/Time   CHOL 139 04/21/2019 1104   TRIG 163.0 (H) 04/21/2019 1104   TRIG 182 (H) 10/10/2006 1254   HDL 45.10 04/21/2019 1104   CHOLHDL 3 04/21/2019 1104   VLDL 32.6 04/21/2019 1104   LDLCALC 61 04/21/2019 1104   LDLDIRECT 76.0 04/11/2017 1408    The ASCVD Risk score (Patillas., et al., 2013) failed to calculate for the following reasons:   The patient has a  prior MI or stroke diagnosis   Patient has failed these meds in past: *** Patient is currently {CHL Controlled/Uncontrolled:[phone]} on the following medications:   Rosuvastatin 10 mg - 1 tablet daily  Aspirin 81 mg - 1 tablet daily   Clopidogrel 75 mg - 1 tablet daily  Isosorbide Mononitrate 30 mg - 1 tablet daily  Nitroglycerin 0.4 mg SL - PRN  Ranolazine 1000 mg - 1 tablet BID   We discussed:  {CHL HP Upstream Pharmacy discussion:[phone]}  Plan: Continue {CHL HP Upstream Pharmacy Plans:[phone]}   Heart Failure   Type: Systolic  Last ejection fraction: *** NYHA Class: {CHL HP Upstream Pharm NYHA [phone]} AHA HF Stage: {CHL HP Upstream Pharm AHA HF Stage:[phone]}  Patient has failed these meds in past: *** Patient is currently {CHL Controlled/Uncontrolled:[phone]} on the following medications:   Furosemide 40 mg - 1 tablet BID  Metolazone 2.5 mg - 1 tablet 30 minutes before Lasix every other morning   Potassium chloride 20 mEq - 1 tablet BID  We discussed {CHL HP Upstream Pharmacy discussion:[phone]}  Plan: Continue {CHL HP Upstream Pharmacy Plans:[phone]}  Chronic respiratory failure /  Tobacco   Last spirometry score: ***  Gold Grade: {CHL HP Upstream Pharm COPD Gold IWLNL:[id number]} Current COPD Classification:  {CHL HP Upstream Pharm COPD Classification:[phone]}  Eosinophil count:   Lab Results  Component Value Date/Time   EOSPCT 1 12/31/2019 01:29 PM  %                               Eos (Absolute):  Lab Results  Component Value Date/Time   EOSABS 0.1 12/31/2019 01:29 PM   Tobacco Status:  Social History   Tobacco Use  Smoking Status Current Every Day Smoker  . Packs/day: 1.00  . Years: 51.00  . Pack years: 51.00  . Types: Cigarettes  . Start date: 16  Smokeless Tobacco Never Used  Tobacco Comment   04/17/19 down to 1 pack her day--LCR  1.5 pack to 2 packs per day. Has been working on reducing the # of cigarettes she smokes, down to 1/2 PP7D    Patient has failed these meds in past: *** Patient is currently {CHL Controlled/Uncontrolled:[phone]} on the following medications:   Stiolto 2.5-2.5 mcg - Inhale 2 puffs daily   Albuterol 90 mcg - 2 puffs q6h PRN  Nicotine gum 4 mg - 1 PRN  Using maintenance inhaler regularly? {yes/no:[phone]} Frequency of rescue inhaler use:  {CHL HP Upstream Pharm Inhaler YCXK:[id number]}  We discussed:  {CHL HP Upstream Pharmacy discussion:[phone]}  Plan: Continue {CHL HP Upstream Pharmacy FWYOV:[id number]}   Diabetes   Recent Relevant Labs: Lab Results  Component Value Date/Time   HGBA1C 6.7 11/03/2018 12:00 AM   HGBA1C 8.0 (H) 06/09/2014 12:06 PM   HGBA1C 9.3 (H) 06/09/2013 12:09 PM   EGFR >90 02/15/2017 02:57 PM   MICROALBUR 1.9 06/09/2013 12:09 PM   MICROALBUR 4.9 (H) 06/06/2012 02:07 PM     Checking BG: {CHL HP Blood Glucose Monitoring Frequency:[phone]}  Recent FBG Readings: Recent pre-meal BG readings: *** Recent 2hr PP BG readings:  *** Recent HS BG readings: *** Patient has failed these meds in past: *** Patient is currently {CHL Controlled/Uncontrolled:[phone]} on the  following medications:   Metformin 500 mg - 1 tablet BID   Glimepiride 2 mg - qAM  Glimepiride 4 mg - qPM  Last diabetic Foot exam:  Lab Results  Component Value Date/Time   HMDIABEYEEXA No  Retinopathy 02/17/2017 12:00 AM    Last diabetic Eye exam: No results found for: HMDIABFOOTEX   We discussed: {CHL HP Upstream Pharmacy discussion:[phone]}  Plan: Continue {CHL HP Upstream Pharmacy Plans:[phone]}  GERD   Patient has failed these meds in past:  Patient is currently {CHL Controlled/[phone]} on the following medications:   Dexlansoprazole 60 mg - 1 capsule daily  We discussed:   Plan: Continue {CHL HP Upstream Pharmacy Plans:[phone]}  Seizures   Patient has failed these meds in past:  Patient is currently {CHL Controlled/[phone]} on the following medications:   Keppra 1000 mg - 1 tablet BID  We discussed:   Plan: Continue {CHL HP Upstream Pharmacy Plans:[phone]}  Allergies   Patient has failed these meds in past:  Patient is currently {CHL Controlled/[phone]} on the following medications:   Benadryl 25 mg - takes 2 tablet BID PRN itching/allergies  Flonase 50 mcg - 2 sprays in each nostril daily  Mucinex - 2 tablets BID  Sodium chloride 0.65% nasal spray - 1 spray in each nostril daily  We discussed:   Plan: Continue {CHL HP Upstream Pharmacy Plans:[phone]}   Neuropathy   Patient has failed these meds in past:  Patient is currently {CHL Controlled/[phone]} on the following medications:   Gabapentin 300 mg - 2 capsules at bedtime   We discussed:   Plan: Continue {CHL HP Upstream Pharmacy WUJWJ:[id number]}   Vaccines   Reviewed and discussed patient's vaccination history.    Immunization History  Administered Date(s) Administered  . Fluad Quad(high Dose 65+) 06/23/2019  . Influenza Whole 06/01/2012  . Influenza,inj,Quad PF,6+ Mos 06/16/2013, 06/16/2014,  06/21/2015, 08/13/2016, 10/11/2017, 07/15/2018  . Pneumococcal Conjugate-13 05/11/2016  . Pneumococcal Polysaccharide-23 11/18/1998, 11/04/2008, 06/16/2014, 06/23/2019  . Td 10/01/1996, 11/15/2009    Plan: Recommended patient receive *** vaccine in *** office/pharmacy.   Medication Management  Misc: Nystatin powder - QID, promethazine 25 mg - 1 tablet q8h PRN,   OTCs: MiraLAX, vitamin B12 1000 mcg - 1 daily, vitamin C 250 mcg - 1 daily  Pharmacy/Benefits: Part D - [employer]  Adherence:  Social support:  Affordability:  CCM Follow Up:  Debbora Dus, PharmD Clinical Pharmacist [facility] [phone]

## 2020-01-06 NOTE — Telephone Encounter (Signed)
  Chronic Care Management   Outreach Note  01/06/2020 Name: Diane [name] MRN: [id number] DOB: April 25, 1956  Referred by: Tower, [name], MD Reason for referral: CCM initial appointment   An unsuccessful telephone outreach was attempted today. The patient was referred to the pharmacist for assistance with care management and care coordination.    Follow Up Plan: left voicemail with contact information; will continue to contact patient for rescheduling   Debbora Dus, PharmD Clinical Pharmacist [facility] [phone]

## 2020-01-08 ENCOUNTER — Ambulatory Visit: Payer: Medicare Other

## 2020-01-08 ENCOUNTER — Inpatient Hospital Stay: Payer: Medicare Other

## 2020-01-08 ENCOUNTER — Other Ambulatory Visit: Payer: Self-pay

## 2020-01-08 VITALS — BP 104/56 | HR 80 | Temp 98.1°F | Resp 18

## 2020-01-08 DIAGNOSIS — D509 Iron deficiency anemia, unspecified: Secondary | ICD-10-CM

## 2020-01-08 MED ORDER — ACETAMINOPHEN 325 MG PO TABS
650.0000 mg | ORAL_TABLET | Freq: Once | ORAL | Status: AC
  Administered 2020-01-08: 650 mg via ORAL

## 2020-01-08 MED ORDER — DIPHENHYDRAMINE HCL 25 MG PO CAPS
25.0000 mg | ORAL_CAPSULE | Freq: Once | ORAL | Status: AC
  Administered 2020-01-08: 25 mg via ORAL

## 2020-01-08 MED ORDER — SODIUM CHLORIDE 0.9 % IV SOLN
Freq: Once | INTRAVENOUS | Status: AC
  Filled 2020-01-08: qty 250

## 2020-01-08 MED ORDER — ACETAMINOPHEN 325 MG PO TABS
ORAL_TABLET | ORAL | Status: AC
  Filled 2020-01-08: qty 2

## 2020-01-08 MED ORDER — DIPHENHYDRAMINE HCL 25 MG PO CAPS
ORAL_CAPSULE | ORAL | Status: AC
  Filled 2020-01-08: qty 1

## 2020-01-08 MED ORDER — SODIUM CHLORIDE 0.9 % IV SOLN
200.0000 mg | Freq: Once | INTRAVENOUS | Status: AC
  Administered 2020-01-08: 200 mg via INTRAVENOUS
  Filled 2020-01-08: qty 200

## 2020-01-08 NOTE — Patient Instructions (Signed)

## 2020-01-15 ENCOUNTER — Ambulatory Visit: Payer: Medicare Other

## 2020-01-15 ENCOUNTER — Inpatient Hospital Stay: Payer: Medicare Other

## 2020-01-15 ENCOUNTER — Other Ambulatory Visit: Payer: Self-pay

## 2020-01-15 VITALS — BP 120/67 | HR 92 | Temp 97.7°F | Resp 18

## 2020-01-15 DIAGNOSIS — D509 Iron deficiency anemia, unspecified: Secondary | ICD-10-CM

## 2020-01-15 MED ORDER — SODIUM CHLORIDE 0.9 % IV SOLN
INTRAVENOUS | Status: DC
  Filled 2020-01-15: qty 250

## 2020-01-15 MED ORDER — ACETAMINOPHEN 325 MG PO TABS
650.0000 mg | ORAL_TABLET | Freq: Once | ORAL | Status: AC
  Administered 2020-01-15: 650 mg via ORAL

## 2020-01-15 MED ORDER — SODIUM CHLORIDE 0.9 % IV SOLN
200.0000 mg | Freq: Once | INTRAVENOUS | Status: AC
  Administered 2020-01-15: 200 mg via INTRAVENOUS
  Filled 2020-01-15: qty 200

## 2020-01-15 MED ORDER — ACETAMINOPHEN 325 MG PO TABS
ORAL_TABLET | ORAL | Status: AC
  Filled 2020-01-15: qty 2

## 2020-01-15 MED ORDER — DIPHENHYDRAMINE HCL 25 MG PO CAPS
ORAL_CAPSULE | ORAL | Status: AC
  Filled 2020-01-15: qty 1

## 2020-01-15 MED ORDER — DIPHENHYDRAMINE HCL 25 MG PO CAPS
25.0000 mg | ORAL_CAPSULE | Freq: Once | ORAL | Status: AC
  Administered 2020-01-15: 25 mg via ORAL

## 2020-01-15 NOTE — Patient Instructions (Signed)

## 2020-01-18 DIAGNOSIS — E041 Nontoxic single thyroid nodule: Secondary | ICD-10-CM | POA: Diagnosis not present

## 2020-01-18 DIAGNOSIS — E1121 Type 2 diabetes mellitus with diabetic nephropathy: Secondary | ICD-10-CM | POA: Diagnosis not present

## 2020-01-18 DIAGNOSIS — I509 Heart failure, unspecified: Secondary | ICD-10-CM | POA: Diagnosis not present

## 2020-01-18 DIAGNOSIS — E7800 Pure hypercholesterolemia, unspecified: Secondary | ICD-10-CM | POA: Diagnosis not present

## 2020-01-18 DIAGNOSIS — G629 Polyneuropathy, unspecified: Secondary | ICD-10-CM | POA: Diagnosis not present

## 2020-01-18 DIAGNOSIS — E119 Type 2 diabetes mellitus without complications: Secondary | ICD-10-CM | POA: Diagnosis not present

## 2020-01-18 DIAGNOSIS — I10 Essential (primary) hypertension: Secondary | ICD-10-CM | POA: Diagnosis not present

## 2020-01-18 NOTE — Progress Notes (Signed)
Diane [name] Date of Birth: 1956/08/02 Medical Record #[id number]  History of Present Illness: Diane [name] is seen for follow up CAD. She has a history of coronary disease and is status post CABG in March of 2007 by Dr. [name]. She presented later that year with recurrent angina. Repeat cardiac cath showed patent LIMA to the LAD but all other grafts occluded including SVG to OM, SVG to AC/PL, and radial graft to ramus intermediate. She then had complex stenting of the mid LCx and first OM with Taxus stents. The native RCA was occluded with collaterals.   In 2016 she was evaluated for abdominal surgery. Myoview abnormal. Cardiac cath as noted above but now stents in LCx and OM occluded. No suitable targets for PCI or surgery. Severe LV dysfunction with moderate pulmonary HTN and normal LV filling pressures. She has multiple cardiac risk factors including diabetes, dyslipidemia, hypertension, and tobacco abuse. She also has  bilateral carotid arterial disease. She continues to smoke.   In August 2016 she had removal of a large pelvic mass with BSO and sigmoid colectomy. Path c/w ovarian cystadenoma. No complications.   She is followed by Dr. Gwenlyn Found for her carotid disease. Angiogram performed with findings of 70% bilateral stenoses. The LICA was heavily calcified. Both arteries had acute angulation after the stenosis making it less favorable for stenting. In March she developed dizziness, lightheadedness, and imbalance. Dr. Glori Bickers ordered an MRI that showed an old right MCA infarct and chronic lacunar infarcts that were new from 2014.   On a prior visit with me we switched her losartan to [facility]. She developed a persistent cough with clear phlegm. [name] was stopped and losartan resumed- cough resolved.  CXR showed no active disease.   She previously complained of pain in her thighs bilaterally. LE dopplers shows good ABIs of 0.93 bilaterally. Mild right iliac stenosis. Moderate right femoral and  SFA disease. Seen by Dr. Gwenlyn Found and medical therapy recommended. This past fall carotid dopplers showed progression of right carotid disease. She was not a candidate for CEA so underwent right carotid stenting with good results.   She reports she had a bad fall in 2019 while standing in a [place] shop. Legs just gave way and she fell.  She reports now that she is feeling progressively weaker and more unsteady. Has to use a walker now. Legs get weak and she gives out walking across her house. Has occasional chest pain that has not changed. Occasional heart skipping. She is chronically SOB. No edema. Last A1c 6.7 %. Feels cold. She was recently diagnosed with anemia. Hgb 9.9. FOBT positive. She did have upper EGD without acute findings.  In June she was noted to be hypotensive. Coreg dose was reduced. Earlier this year seen again with profound hypotension leading to discontinuation of losartan and Coreg. She did develop some volume excess and metolazone was added to BID lasix. Echo repeated and showed EF 20-25%, mod-severe MR and severe TR with estimated PA pressure 49 mm Hg. Mild to moderate AS. She is receiving iron infusions for iron deficiency anemia.   She was referred to the Advanced heart failure clinic and saw Dr Aundra Dubin on April 21. Class IIIb symptoms. Orthostatic symptoms limited ability to titrate medication. He was concerned about low output. She was started on digoxin. Lasix was switched to torsemide with use of metolazone PRN. RHC and CPX planned to guide management with possible consideration for LVAD. In the week since she has been off metolazone she notes  she has gained 5 lbs. She did note improvement in breathing on metolazone. Her swelling is better. Rare chest pain. Still feels dizzy and lightheaded.  She feels more congested in her chest.  Current Outpatient Medications on File Prior to Visit  Medication Sig Dispense Refill  . acetaminophen (TYLENOL) 500 MG tablet Take 1,000 mg by mouth  every 6 (six) hours as needed for moderate pain or headache.    . albuterol (VENTOLIN HFA) 108 (90 Base) MCG/ACT inhaler INHALER 2 PUFFS EVERY 6 HOURS AS NEEDED FOR WHEEZING/SHORTNESS OF BREATH (Patient taking differently: Inhale 2 puffs into the lungs every 6 (six) hours as needed for wheezing or shortness of breath. ) 18 g 0  . aspirin EC 81 MG tablet Take 81 mg by mouth every evening.  30 tablet 6  . clopidogrel (PLAVIX) 75 MG tablet Take 1 tablet (75 mg total) by mouth daily. 90 tablet 3  . dexlansoprazole (DEXILANT) 60 MG capsule TAKE 1 CAPSULE BY MOUTH EVERY DAY (Patient taking differently: Take 60 mg by mouth daily. ) 90 capsule 3  . digoxin (LANOXIN) 0.125 MG tablet Take 0.5 tablets (0.0625 mg total) by mouth daily. 15 tablet 3  . diphenhydrAMINE (BENADRYL) 25 mg capsule Take 25 mg by mouth 2 (two) times daily as needed for itching or allergies.    . fluticasone (FLONASE) 50 MCG/ACT nasal spray Place 2 sprays into both nostrils daily. 48 g 1  . gabapentin (NEURONTIN) 300 MG capsule TAKE 2 CAPSULES BY MOUTH AT BEDTIME (Patient taking differently: Take 600 mg by mouth at bedtime. ) 180 capsule 1  . glimepiride (AMARYL) 2 MG tablet Take 2 mg by mouth daily.     [name] glimepiride (AMARYL) 4 MG tablet Take 4 mg by mouth every evening.     [name] guaiFENesin (MUCINEX) 600 MG 12 hr tablet Take 2 tablets (1,200 mg total) by mouth 2 (two) times daily. 360 tablet 3  . iron polysaccharides (NU-IRON) 150 MG capsule Take 1 capsule (150 mg total) by mouth 2 (two) times daily. 180 capsule 3  . isosorbide mononitrate (IMDUR) 30 MG 24 hr tablet Take 1 tablet (30 mg total) by mouth daily. 90 tablet 3  . levETIRAcetam (KEPPRA) 1000 MG tablet TAKE 1 TABLET BY MOUTH TWICE A DAY (Patient taking differently: Take 1,000 mg by mouth 2 (two) times daily. ) 180 tablet 3  . metFORMIN (GLUCOPHAGE-XR) 500 MG 24 hr tablet Take 500 mg by mouth every evening.   6  . nicotine polacrilex (NICORETTE) 4 MG gum Take 1 each (4 mg total) by  mouth as needed for smoking cessation. 90 tablet 2  . nitroGLYCERIN (NITROLINGUAL) 0.4 MG/SPRAY spray Place 1 spray under the tongue every 5 (five) minutes x 3 doses as needed for chest pain. 12 g 3  . nystatin (NYSTATIN) powder Apply topically 4 (four) times daily. (Patient taking differently: Apply 1 application topically 4 (four) times daily as needed (yeast infections). ) 15 g 3  . ONE TOUCH ULTRA TEST test strip USE AS DIRECTED FOR TESTING BLOOD GLUCOSE 3 TIMES DAILY  1  . polyethylene glycol powder (GLYCOLAX/MIRALAX) powder TAKE 1 DOSE 17 GRAMS IN 8 OZ OF WATER DAILY AS NEEDED FOR CONSTIPATION. (Patient taking differently: Take 17 g by mouth daily. ) 527 g 3  . potassium chloride SA (KLOR-CON) 20 MEQ tablet Take 20 mEq by mouth 2 (two) times daily.    . promethazine (PHENERGAN) 25 MG tablet Take 1 tablet (25 mg total) by mouth every 8 (  eight) hours as needed for nausea or vomiting. Caution of sedation 30 tablet 1  . ranolazine (RANEXA) 1000 MG SR tablet Take 1 tablet (1,000 mg total) by mouth 2 (two) times daily. 180 tablet 3  . rosuvastatin (CRESTOR) 10 MG tablet TAKE 1 TABLET BY MOUTH EVERY DAY (Patient taking differently: Take 10 mg by mouth daily. ) 90 tablet 1  . sodium chloride (OCEAN) 0.65 % SOLN nasal spray Place 1 spray into both nostrils as needed for congestion.    . Tiotropium Bromide-Olodaterol (STIOLTO RESPIMAT) 2.5-2.5 MCG/ACT AERS Inhale 2 puffs into the lungs daily. 8 g 0  . torsemide (DEMADEX) 20 MG tablet Take 3 tablets (60 mg total) by mouth daily. 90 tablet 3  . TRESIBA FLEXTOUCH 100 UNIT/ML FlexTouch Pen Inject 5 Units into the skin daily.     . vitamin B-12 (CYANOCOBALAMIN) 1000 MCG tablet Take 1,000 mcg by mouth daily.    . vitamin C (ASCORBIC ACID) 250 MG tablet Take 1 tablet (250 mg total) by mouth daily. 90 tablet 3   No current facility-administered medications on file prior to visit.    Allergies  Allergen Reactions  . Bee Venom Itching and Swelling  .  Benadryl [Diphenhydramine] Other (See Comments)    Insomnia   . Entresto [Sacubitril-Valsartan] Cough  . Tetracycline     Unknown, pt cannot recall exact reaction  . Ace Inhibitors Cough    Past Medical History:  Diagnosis Date  . Anemia   . Carotid stenosis    a. s/p right carotid stent 10/2017.  [name] Chronic systolic CHF (congestive heart failure) ([place])   . Colon polyps   . COPD (chronic obstructive pulmonary disease) ([place])   . Coronary artery disease    post CABG in 3/07 , coronary stents   . Diabetes mellitus    type 2  . Dyslipidemia   . Fibromyalgia   . GERD (gastroesophageal reflux disease)   . Headache    hx of   . Hyperlipidemia   . Hypertension   . Myocardial infarction ([place])   . Pneumonia    hx of   . Seizures ([place])    last seizure- 03/2013   . Shortness of breath dyspnea    with exertion or when fluid builds up   . Sleep apnea    used to wear a cpap- not used in 3 years   . Status post dilation of esophageal narrowing   . Stroke [facility]) 1993   problems with balance   . Systolic murmur    known mild AS and MR  . Thyroid goiter   . Tobacco abuse     Past Surgical History:  Procedure Laterality Date  . APPENDECTOMY    . BIOPSY  02/03/2019   Procedure: BIOPSY;  Surgeon: Ladene Artist, MD;  Location: [facility];  Service: Endoscopy;;  . BREAST BIOPSY Left 2016  . CARDIAC CATHETERIZATION  11/29/05   EF of 55%  . CARDIAC CATHETERIZATION  08/06/06   EF of 45-50%  . CARDIAC CATHETERIZATION N/A 05/06/2015   Procedure: Right/Left Heart Cath and Coronary/Graft Angiography;  Surgeon: Macall Mccroskey M Martinique, MD;  Location: [place] [facility];  Service: Cardiovascular;  Laterality: N/A;  . CAROTID PTA/STENT INTERVENTION N/A 10/10/2017   Procedure: CAROTID PTA/STENT INTERVENTION - Right;  Surgeon: Serafina Mitchell, MD;  Location: [place] [facility];  Service: Cardiovascular;  Laterality: N/A;  . CERVICAL FUSION  1990  . CHOLECYSTECTOMY    . COLON RESECTION  mass removed and  4 in of colon  . COLONOSCOPY WITH PROPOFOL N/A 07/16/2017   Procedure: COLONOSCOPY WITH PROPOFOL;  Surgeon: Ladene Artist, MD;  Location: [facility];  Service: Endoscopy;  Laterality: N/A;  . CORONARY ARTERY BYPASS GRAFT  12/04/2005   x5 -- left internal mammary artery to the LAD, left radial artery to the ramus intermedius, saphenous vein graft to the obtuse marginal 1, sequential saphenous vein grat to the acute marginal and posterior descending, endoscopic vein harvesting from the left thigh with open vein harvest from right leg  . CORONARY STENT PLACEMENT  08/11/06   PCI of her ciurcumflex/OM vessel  . ESOPHAGOGASTRODUODENOSCOPY (EGD) WITH PROPOFOL N/A 02/03/2019   Procedure: ESOPHAGOGASTRODUODENOSCOPY (EGD) WITH PROPOFOL;  Surgeon: Ladene Artist, MD;  Location: [facility];  Service: Endoscopy;  Laterality: N/A;  . LAPAROTOMY Bilateral 05/19/2015   Procedure: EXPLORATORY LAPAROTOMY WITH BILATERAL SALPINGO OOPHORECTOMY /OMENTECTOMY/SEGMENTAL SIGMOID COLECTOMY ;  Surgeon: Everitt Amber, MD;  Location: [facility];  Service: Gynecology;  Laterality: Bilateral;  . PERIPHERAL VASCULAR CATHETERIZATION N/A 11/15/2015   Procedure: Carotid PTA/Stent Intervention;  Surgeon: Lorretta Harp, MD;  Location: [place] [facility];  Service: Cardiovascular;  Laterality: N/A;  . PERIPHERAL VASCULAR CATHETERIZATION  11/15/2015   Procedure: Carotid Angiography;  Surgeon: Lorretta Harp, MD;  Location: [place] [facility];  Service: Cardiovascular;;    Social History   Tobacco Use  Smoking Status Current Every Day Smoker  . Packs/day: 1.00  . Years: 51.00  . Pack years: 51.00  . Types: Cigarettes  . Start date: 51  Smokeless Tobacco Never Used  Tobacco Comment   04/17/19 down to 1 pack her day--LCR  1.5 pack to 2 packs per day. Has been working on reducing the # of cigarettes she smokes, down to 1/2 PP7D    Social History   Substance and Sexual Activity  Alcohol Use No    Family History  Problem  Relation Age of Onset  . Heart disease Mother   . Diabetes Mother   . COPD Mother   . Hyperlipidemia Mother   . Hypertension Mother   . Cancer Father        met, origin unknown  . Heart attack Father   . Drug abuse Paternal Grandmother   . Stroke Paternal Grandfather   . Lung cancer Paternal Aunt        lung with mets to brain  . Melanoma Paternal Uncle   . Lung cancer Paternal Uncle        lung/liver to brain  . Cancer Paternal Uncle        cancer of unknown type  . Heart disease Maternal Grandfather   . Hypertension Sister   . Cancer Sister        eyelid  . Glaucoma Sister   . Heart disease Maternal Aunt        x 2 aunts    Review of Systems: As noted in history of present illness.  All other systems were reviewed and are negative.  Physical Exam: BP 123/72   Pulse (!) 103   Ht '4\' 10"'  (1.473 m)   Wt 133 lb 6.4 oz (60.5 kg)   LMP 09/02/2003   SpO2 95%   BMI 27.88 kg/m  GENERAL:  Well appearing WF in NAD HEENT:  PERRL, EOMI, sclera are clear. Oropharynx is clear. NECK:  No jugular venous distention, bilateral carotid bruits L>R, no thyromegaly or adenopathy LUNGS:  Clear to auscultation bilaterally CHEST:  Unremarkable HEART:  RRR,  PMI not displaced or sustained,S1 and S2 within normal limits, no S3, no S4: no clicks, no rubs, gr 2/6 systolic murmur RUSB.  ABD:  Soft, nontender. BS +, no masses or bruits. No hepatomegaly, no splenomegaly EXT:  1+ pulses throughout, no edema, no cyanosis no clubbing SKIN:  Warm and dry.  No rashes NEURO:  Alert and oriented x 3. Cranial nerves II through XII intact. PSYCH:  Cognitively intact    LABORATORY DATA: Lab Results  Component Value Date   WBC 6.2 01/20/2020   HGB 14.4 01/20/2020   HCT 46.4 (H) 01/20/2020   PLT 210 01/20/2020   GLUCOSE 259 (H) 01/20/2020   CHOL 139 04/21/2019   TRIG 163.0 (H) 04/21/2019   HDL 45.10 04/21/2019   LDLDIRECT 76.0 04/11/2017   LDLCALC 61 04/21/2019   ALT 9 11/05/2019   AST 13 (L)  11/05/2019   NA 136 01/20/2020   K 3.4 (L) 01/20/2020   CL 94 (L) 01/20/2020   CREATININE 0.70 01/20/2020   BUN 22 01/20/2020   CO2 30 01/20/2020   TSH 1.80 04/21/2019   INR 1.05 11/10/2015   HGBA1C 6.7 11/03/2018   MICROALBUR 1.9 06/09/2013   Ecg today shows NSR with rate 86. Old inferior MI. T wave abnormality c/w lateral ischemia. Unchanged from prior. I have personally reviewed and interpreted this study.  Echo 11/17/19: IMPRESSIONS    1. Left ventricular ejection fraction, by estimation, is 20 to 25%. Left  ventricular ejection fraction by 3D volume is 23 %. The left ventricle has  severely decreased function. The left ventricle demonstrates global  hypokinesis. Left ventricular  diastolic parameters are consistent with Grade II diastolic dysfunction  (pseudonormalization). Elevated left atrial pressure.  2. Right ventricular systolic function is moderately reduced. The right  ventricular size is moderately enlarged. There is moderately elevated  pulmonary artery systolic pressure. The estimated right ventricular  systolic pressure is 00.8 mmHg.  3. Left atrial size was severely dilated.  4. Right atrial size was severely dilated.  5. The mitral valve is degenerative. Moderate to severe mitral valve  regurgitation. No evidence of mitral stenosis.  6. Tricuspid valve regurgitation is severe.  7. Mild to moderate aortic stenosis. V max 2.2 m/s, mean gradient 10.5  mmHG. Gradients lower due to lower stroke volume (SV=38 cc; SVi=24 cc/m2).  The aortic valve is tricuspid. Aortic valve regurgitation is not  visualized.  8. There is mild dilatation of the ascending aorta measuring 41 mm.  9. The inferior vena cava is dilated in size with <50% respiratory  variability, suggesting right atrial pressure of 15 mmHg.   Comparison(s): A prior study was performed on 04/25/2015. Prior images  reviewed side by side. Changes from prior study are noted. EF now 20-25%.  Severe TR.  Moderate to severe MR present. RV moderately enlarged with  reduced function.   Assessment / Plan: 1. CAD S/p CABG. Failed bypass grafts except for LIMA to LAD. S/p remote stenting of LCX and OM with Taxus DES now occluded. Stable class 2 angina. Really unchanged from before. No targets for intervention. Continue aggressive antianginal Rx. With ASA, plavix,  and Ranexa. Intolerant of beta blockers and nitrates due to hypotension.Encourage  smoking cessation.  2. Carotid arterial disese- s/p right carotid stenting. Followed by Dr. Gwenlyn Found. On chronic ASA/Plavix 3. Tobacco abuse. Recommend complete cessation.  4. Chronic combined systolic and diastolic CHF. Severe LV dysfunction. Intolerant of ARB/entresto and Coreg due to hypotension. Now on torsemide but has gained  5 lbs in one week. Instructed her to take metolazone today pending right heart cath tomorrow. CPX planned per Dr Aundra Dubin. 5. Pulmonary HTN. 6. S/p major abdominal surgery for cystadenoma. Now with CT showing evidence of incisional hernia. Poor candidate for general anesthesia. Conservative management.  7. Lacunar infarcts.  8. PAD. Dopplers in May 2019 showed no significant obstruction. Repeat study scheduled today. 10. Iron deficiency anemia with FOBT positive. Prior EGD showed no reason for blood loss. Receiving intermittent iron infusions.  I will follow up in 3 months.

## 2020-01-20 ENCOUNTER — Ambulatory Visit (HOSPITAL_COMMUNITY)
Admission: RE | Admit: 2020-01-20 | Discharge: 2020-01-20 | Disposition: A | Discharge status: Home / Self Care | Payer: Medicare Other | Source: Ambulatory Visit | Attending: Cardiology | Admitting: Cardiology

## 2020-01-20 ENCOUNTER — Other Ambulatory Visit (HOSPITAL_COMMUNITY): Payer: Self-pay | Admitting: Cardiology

## 2020-01-20 ENCOUNTER — Other Ambulatory Visit: Payer: Self-pay

## 2020-01-20 ENCOUNTER — Encounter (HOSPITAL_COMMUNITY): Payer: Self-pay | Admitting: Cardiology

## 2020-01-20 VITALS — BP 100/82 | HR 90 | Wt 128.8 lb

## 2020-01-20 DIAGNOSIS — I2510 Atherosclerotic heart disease of native coronary artery without angina pectoris: Secondary | ICD-10-CM | POA: Diagnosis not present

## 2020-01-20 DIAGNOSIS — R531 Weakness: Secondary | ICD-10-CM | POA: Insufficient documentation

## 2020-01-20 DIAGNOSIS — I110 Hypertensive heart disease with heart failure: Secondary | ICD-10-CM | POA: Insufficient documentation

## 2020-01-20 DIAGNOSIS — Z8673 Personal history of transient ischemic attack (TIA), and cerebral infarction without residual deficits: Secondary | ICD-10-CM | POA: Insufficient documentation

## 2020-01-20 DIAGNOSIS — Z7982 Long term (current) use of aspirin: Secondary | ICD-10-CM | POA: Diagnosis not present

## 2020-01-20 DIAGNOSIS — I739 Peripheral vascular disease, unspecified: Secondary | ICD-10-CM

## 2020-01-20 DIAGNOSIS — Z794 Long term (current) use of insulin: Secondary | ICD-10-CM | POA: Insufficient documentation

## 2020-01-20 DIAGNOSIS — Z951 Presence of aortocoronary bypass graft: Secondary | ICD-10-CM | POA: Insufficient documentation

## 2020-01-20 DIAGNOSIS — Z8249 Family history of ischemic heart disease and other diseases of the circulatory system: Secondary | ICD-10-CM | POA: Diagnosis not present

## 2020-01-20 DIAGNOSIS — Z955 Presence of coronary angioplasty implant and graft: Secondary | ICD-10-CM | POA: Diagnosis not present

## 2020-01-20 DIAGNOSIS — I255 Ischemic cardiomyopathy: Secondary | ICD-10-CM | POA: Insufficient documentation

## 2020-01-20 DIAGNOSIS — Z79899 Other long term (current) drug therapy: Secondary | ICD-10-CM | POA: Diagnosis not present

## 2020-01-20 DIAGNOSIS — E785 Hyperlipidemia, unspecified: Secondary | ICD-10-CM | POA: Insufficient documentation

## 2020-01-20 DIAGNOSIS — I5022 Chronic systolic (congestive) heart failure: Secondary | ICD-10-CM | POA: Diagnosis not present

## 2020-01-20 DIAGNOSIS — G40909 Epilepsy, unspecified, not intractable, without status epilepticus: Secondary | ICD-10-CM | POA: Diagnosis not present

## 2020-01-20 DIAGNOSIS — E119 Type 2 diabetes mellitus without complications: Secondary | ICD-10-CM | POA: Diagnosis not present

## 2020-01-20 DIAGNOSIS — F17210 Nicotine dependence, cigarettes, uncomplicated: Secondary | ICD-10-CM | POA: Diagnosis not present

## 2020-01-20 LAB — BASIC METABOLIC PANEL
Anion gap: 12 (ref 5–15)
BUN: 22 mg/dL (ref 8–23)
CO2: 30 mmol/L (ref 22–32)
Calcium: 9.5 mg/dL (ref 8.9–10.3)
Chloride: 94 mmol/L — ABNORMAL LOW (ref 98–111)
Creatinine, Ser: 0.7 mg/dL (ref 0.44–1.00)
GFR calc Af Amer: >60 mL/min (ref >60)
GFR calc non Af Amer: >60 mL/min (ref >60)
Glucose, Bld: 259 mg/dL — ABNORMAL HIGH (ref 70–99)
Potassium: 3.4 mmol/L — ABNORMAL LOW (ref 3.5–5.1)
Sodium: 136 mmol/L (ref 135–145)

## 2020-01-20 LAB — CBC
HCT: 46.4 % — ABNORMAL HIGH (ref 36.0–46.0)
Hemoglobin: 14.4 g/dL (ref 12.0–15.0)
MCH: 25.7 pg — ABNORMAL LOW (ref 26.0–34.0)
MCHC: 31 g/dL (ref 30.0–36.0)
MCV: 82.9 fL (ref 80.0–100.0)
Platelets: 210 10*3/uL (ref 150–400)
RBC: 5.6 MIL/uL — ABNORMAL HIGH (ref 3.87–5.11)
RDW: 21 % — ABNORMAL HIGH (ref 11.5–15.5)
WBC: 6.2 10*3/uL (ref 4.0–10.5)
nRBC: 0 % (ref 0.0–0.2)

## 2020-01-20 MED ORDER — DIGOXIN 125 MCG PO TABS: 0.0625 mg | ORAL_TABLET | Freq: Every day | ORAL | 3 refills | Status: DC

## 2020-01-20 MED ORDER — TORSEMIDE 20 MG PO TABS: 60.0000 mg | ORAL_TABLET | Freq: Every day | ORAL | 3 refills | Status: DC

## 2020-01-20 NOTE — Patient Instructions (Addendum)
Stop Metolazone  Stop Furosemide  Start Torsemide 60 mg (3 tabs) daily  Start Digoxin 0.0625 mg (1/2 tab) daily  Labs done today, your results will be available in MyChart, we will contact you for abnormal readings.  Your physician has requested that you have a lower extremity arterial exercise duplex. During this test, exercise and ultrasound are used to evaluate arterial blood flow in the legs. Allow one hour for this exam. There are no restrictions or special instructions.  You will be contacted to schedule this  Your physician has recommended that you have a cardiopulmonary stress test (CPX). CPX testing is a non-invasive measurement of heart and lung function. It replaces a traditional treadmill stress test. This type of test provides a tremendous amount of information that relates not only to your present condition but also for future outcomes. This test combines measurements of you ventilation, respiratory gas exchange in the lungs, electrocardiogram (EKG), blood pressure and physical response before, during, and following an exercise protocol.  Heart Catheterization on Wed 4/28, see instructions below  Your physician recommends that you schedule a follow-up appointment in: 3 weeks  If you have any questions or concerns before your next appointment please send [place] a message through [place] or call our office at [phone].  At the [facility], you and your health needs are our priority. As part of our continuing mission to provide you with exceptional heart care, we have created designated Provider Care Teams. These Care Teams include your primary Cardiologist (physician) and Advanced Practice Providers (APPs- Physician Assistants and Nurse Practitioners) who all work together to provide you with the care you need, when you need it.   You may see any of the following providers on your designated Care Team at your next follow up: [name] Dr Glori Bickers . Dr Loralie Champagne . Darrick Grinder, NP . Lyda Jester, PA . Audry Riles, PharmD   Please be sure to bring in all your medications bottles to every appointment.    Heart Catheterization Instructions:  You are scheduled for a Cardiac Catheterization on Wednesday, April 28 with Dr. Loralie Champagne.  1. Please arrive at the [facility] (Main Entrance A) at [facility]: [address] [place], [place] [address] at 8:30 AM (This time is two hours before your procedure to ensure your preparation). Free valet parking service is available.   Special note: Every effort is made to have your procedure done on time. Please understand that emergencies sometimes delay scheduled procedures.  2. Diet: Do not eat solid foods after midnight.  The patient may have clear liquids until 5am upon the day of the procedure.  3. COVID TEST: Monday 4/26 at 9:40 am, see attached direction sheet  4. Medication instructions in preparation for your procedure:    Wed 4/28 AM: DO NOT TAKE THE FOLLOWING MEDICATIONS:  TORSEMIDE, GLIMEPIRIDE, METFORMIN    On the morning of your procedure, take your Plavix/Clopidogrel and any morning medicines NOT listed above.  You may use sips of water.  5. Plan for one night stay--bring personal belongings. 6. Bring a current list of your medications and current insurance cards. 7. You MUST have a responsible person to drive you home. 8. Someone MUST be with you the first 24 hours after you arrive home or your discharge will be delayed. 9. Please wear clothes that are easy to get on and off and wear slip-on shoes.  Thank you for allowing [place] to care for you!   --  [place] [facility]

## 2020-01-20 NOTE — Progress Notes (Signed)
PCP: [name], MD Cardiology: Dr. Martinique HF Cardiology: Dr. Aundra Dubin  64 y.o. with history of CAD, ischemic cardiomyopathy, CVA, and valvular disease was referred by Dr. Martinique for CHF evaluation.  Patient has an extensive history of vascular disease.  She had CABG in 2007.  Repeat LHC in 2007 showed all grafts closed except LIMA-LAD.  Patient had DES to LCx/OM.  [place] 2016 showed occlusion of DES to LCx/OM with L-L and L-R collaterals. Poor targets for redo CABG.   In 2/21, echo was done showing EF 20-25% with moderate RV dysfunction, moderate-severe MR, moderate TR.  She continues to smoke, now down to 1/2 ppd from 2 ppd.  Surprisingly, PFTs in 2019 looked relatively normal. She additionally had the placement of a carotid stent on the right in 1/19.    Patient has had orthostatic symptoms and low BP has limited medication titration.  She occasionally gets lightheaded with standing.  Most recently, low dose Coreg dropped her SBP into the 70s.  She lives alone with her sister and nephew next door.  She moves slowly and fatigues easily.  She has significant exertional dyspnea with minimal activity.  She is unable to get to the mailbox. No chest pain.  She walks with a walker and is short of breath walking around her house.  This has been slowly progressive. She has struggled with volume overload.  Currently taking Lasix 40 mg bid and was started on metolazone 2.5 mg every other day due to progressive weight gain.  Weight has started to trend down on metolazone.    ECG (personally reviewed): NSR, PVC, old inferior MI, poor RWP, QTc 492 msec  Labs (4/21): K 3.7, creatinine 0.78, hgb 11.3  PMH: 1. CAD: CABG 2007.  - LHC in 2007 showed all grafts closed except LIMA-LAD.  Patient had DES to LCx/OM.   - [place] 2016 showed occlusion of DES to LCx/OM with L-L and L-R collaterals.  - Not candidate for redo CABG with poor targets.  2. Chronic systolic CHF: Ischemic cardiomyopathy.   - Echo (2/21): EF 20-25%,  moderately dilated and moderately dysfunctional RV, PASP 49, severe biatrial enlargement, moderate-severe MR, severe TR, mild-moderate AS, IVC dilated.  3. Fe deficiency anemia: FOBT negative.  4. Carotid stenosis: Right carotid stent in 1/19.  - Carotid dopplers (5/11): 02-11% LICA stenosis.  5. H/o CVA 6. HTN 7. Type 2 diabetes 8. Hyperlipidemia 9. Seizure disorder 10. OSA: Cannot tolerate CPAP.  11. Active smoker: PFTs in 2019 were relatively normal.  She does use oxygen at night.   Social History   Socioeconomic History  . Marital status: Divorced    Spouse name: Not on file  . Number of children: 0  . Years of education: Not on file  . Highest education level: Not on file  Occupational History  . Occupation: disabled  Tobacco Use  . Smoking status: Current Every Day Smoker    Packs/day: 1.00    Years: 51.00    Pack years: 51.00    Types: Cigarettes    Start date: 48  . Smokeless tobacco: Never Used  . Tobacco comment: 04/17/19 down to 1 pack her day--LCR  1.5 pack to 2 packs per day. Has been working on reducing the # of cigarettes she smokes, down to 1/2 PP7D  Substance and Sexual Activity  . Alcohol use: No  . Drug use: No  . Sexual activity: Not Currently    Partners: Male    Birth control/protection: None  Other Topics  Concern  . Not on file  Social History Narrative  . Not on file   Social Determinants of Health   Financial Resource Strain: Low Risk   . Difficulty of Paying Living Expenses: Not hard at all  Food Insecurity: No Food Insecurity  . Worried About Charity fundraiser in the Last Year: Never true  . Ran Out of Food in the Last Year: Never true  Transportation Needs: No Transportation Needs  . Lack of Transportation (Medical): No  . Lack of Transportation (Non-Medical): No  Physical Activity: Insufficiently Active  . Days of Exercise per Week: 2 days  . Minutes of Exercise per Session: 60 min  Stress: No Stress Concern Present  . Feeling of  Stress : Not at all  Social Connections:   . Frequency of Communication with Friends and Family:   . Frequency of Social Gatherings with Friends and Family:   . Attends Religious Services:   . Active Member of Clubs or Organizations:   . Attends Archivist Meetings:   [name] Marital Status:   Intimate Partner Violence: Not At Risk  . Fear of Current or Ex-Partner: No  . Emotionally Abused: No  . Physically Abused: No  . Sexually Abused: No   Family History  Problem Relation Age of Onset  . Heart disease Mother   . Diabetes Mother   . COPD Mother   . Hyperlipidemia Mother   . Hypertension Mother   . Cancer Father        met, origin unknown  . Heart attack Father   . Drug abuse Paternal Grandmother   . Stroke Paternal Grandfather   . Lung cancer Paternal Aunt        lung with mets to brain  . Melanoma Paternal Uncle   . Lung cancer Paternal Uncle        lung/liver to brain  . Cancer Paternal Uncle        cancer of unknown type  . Heart disease Maternal Grandfather   . Hypertension Sister   . Cancer Sister        eyelid  . Glaucoma Sister   . Heart disease Maternal Aunt        x 2 aunts   ROS: All systems reviewed and negative except as per HPI.   Current Outpatient Medications  Medication Sig Dispense Refill  . acetaminophen (TYLENOL) 500 MG tablet Take 1,000 mg by mouth every 6 (six) hours as needed for moderate pain or headache.    . albuterol (VENTOLIN HFA) 108 (90 Base) MCG/ACT inhaler INHALER 2 PUFFS EVERY 6 HOURS AS NEEDED FOR WHEEZING/SHORTNESS OF BREATH 18 g 0  . aspirin EC 81 MG tablet Take 81 mg by mouth every evening.  30 tablet 6  . clopidogrel (PLAVIX) 75 MG tablet Take 1 tablet (75 mg total) by mouth daily. 90 tablet 3  . dexlansoprazole (DEXILANT) 60 MG capsule TAKE 1 CAPSULE BY MOUTH EVERY DAY 90 capsule 3  . diphenhydrAMINE (BENADRYL) 25 mg capsule Take 25 mg by mouth 2 (two) times daily as needed for itching or allergies.    . fluticasone  (FLONASE) 50 MCG/ACT nasal spray Place 2 sprays into both nostrils daily. 48 g 1  . gabapentin (NEURONTIN) 300 MG capsule TAKE 2 CAPSULES BY MOUTH AT BEDTIME 180 capsule 1  . glimepiride (AMARYL) 2 MG tablet Take 2 mg by mouth daily. Only in the am    . glimepiride (AMARYL) 4 MG tablet Take 4 mg by  mouth daily. Only in the pm    . guaiFENesin (MUCINEX) 600 MG 12 hr tablet Take 2 tablets (1,200 mg total) by mouth 2 (two) times daily. 360 tablet 3  . iron polysaccharides (NU-IRON) 150 MG capsule Take 1 capsule (150 mg total) by mouth 2 (two) times daily. 180 capsule 3  . isosorbide mononitrate (IMDUR) 30 MG 24 hr tablet Take 1 tablet (30 mg total) by mouth daily. 90 tablet 3  . levETIRAcetam (KEPPRA) 1000 MG tablet TAKE 1 TABLET BY MOUTH TWICE A DAY 180 tablet 3  . metFORMIN (GLUCOPHAGE-XR) 500 MG 24 hr tablet Take 500 mg by mouth 2 (two) times daily.  6  . nicotine polacrilex (NICORETTE) 4 MG gum Take 1 each (4 mg total) by mouth as needed for smoking cessation. 90 tablet 2  . nitroGLYCERIN (NITROLINGUAL) 0.4 MG/SPRAY spray Place 1 spray under the tongue every 5 (five) minutes x 3 doses as needed for chest pain. 12 g 3  . nystatin (NYSTATIN) powder Apply topically 4 (four) times daily. 15 g 3  . ONE TOUCH ULTRA TEST test strip USE AS DIRECTED FOR TESTING BLOOD GLUCOSE 3 TIMES DAILY  1  . polyethylene glycol powder (GLYCOLAX/MIRALAX) powder TAKE 1 DOSE 17 GRAMS IN 8 OZ OF WATER DAILY AS NEEDED FOR CONSTIPATION. (Patient taking differently: Take 17 g by mouth daily. Take 1 dose 17 grams in 8 oz of water daily for constipation.) 527 g 3  . potassium chloride SA (KLOR-CON) 20 MEQ tablet Take 20 mEq by mouth 2 (two) times daily.    . promethazine (PHENERGAN) 25 MG tablet Take 1 tablet (25 mg total) by mouth every 8 (eight) hours as needed for nausea or vomiting. Caution of sedation 30 tablet 1  . ranolazine (RANEXA) 1000 MG SR tablet Take 1 tablet (1,000 mg total) by mouth 2 (two) times daily. 180 tablet 3   . rosuvastatin (CRESTOR) 10 MG tablet TAKE 1 TABLET BY MOUTH EVERY DAY 90 tablet 1  . sodium chloride (OCEAN) 0.65 % SOLN nasal spray Place 1 spray into both nostrils as needed for congestion.    . Tiotropium Bromide-Olodaterol (STIOLTO RESPIMAT) 2.5-2.5 MCG/ACT AERS Inhale 2 puffs into the lungs daily. 8 g 0  . TRESIBA FLEXTOUCH 100 UNIT/ML FlexTouch Pen Inject 5 Units into the skin daily.     . vitamin B-12 (CYANOCOBALAMIN) 1000 MCG tablet Take 1,000 mcg by mouth daily.    . vitamin C (ASCORBIC ACID) 250 MG tablet Take 1 tablet (250 mg total) by mouth daily. 90 tablet 3  . digoxin (LANOXIN) 0.125 MG tablet Take 0.5 tablets (0.0625 mg total) by mouth daily. 15 tablet 3  . torsemide (DEMADEX) 20 MG tablet Take 3 tablets (60 mg total) by mouth daily. 90 tablet 3   No current facility-administered medications for this encounter.   BP 100/82   Pulse 90   Wt 58.4 kg (128 lb 12.8 oz)   LMP 09/02/2003   SpO2 97%   BMI 27.87 kg/m  General: NAD Neck: JVP 8 cm with HJR, no thyromegaly or thyroid nodule.  Lungs: Rhonchi CV: Nondisplaced PMI.  Heart regular S1/S2, no S3/S4, 2/6 SEM RUSB.  No peripheral edema.  No carotid bruit.  Unable to palpate pedal pulses.  Abdomen: Soft, nontender, no hepatosplenomegaly, no distention.  Skin: Intact without lesions or rashes.  Neurologic: Alert and oriented x 3.  Psych: Normal affect. Extremities: No clubbing or cyanosis.  HEENT: Normal.    Assessment/Plan:  1. Chronic systolic CHF: Ischemic  cardiomyopathy.  Echo in 2/21 showed EF 20-25%, moderately dilated and moderately dysfunctional RV, PASP 49, severe biatrial enlargement, moderate-severe MR, severe TR, mild-moderate AS, IVC dilated. On exam today, she is mildly volume overloaded.  NYHA class IIIb symptoms, she is very limited.  Medication titration has been severely limited by hypotension and orthostatic symptoms.  I am concerned for low output HF.   - With suspicion for low output HF, start digoxin  0.0625 mg daily.  - I am concerned that renal function will suffer on every other day metolazone.  Instead, I will have her stop Lasix and start torsemide 60 mg daily.  If weight begins to rise, she can take periodic metolazone. BMET today and again in 10 days.  - She has not tolerated BP-active meds, avoid Coreg and Entresto for now.  May be able to tolerate spironolactone in the future.  - She needs RHC to assess filling pressures and cardiac output.  We discussed risks/benefits of procedure and she agrees.   - I will arrange for CPX.  - As above, concern for low output HF.  Medication titration will likely be limited.  We may need to consider her for LVAD (not transplant candidate with ongoing smoking).  This is complicated by her small size and her prior sternotomy.  However, her PFTs in 2019 looked surprisingly good.  2. CAD: s/p CABG 7654 complicated by early graft closure.  Repeat cath in 2007 showed only LIMA-LAD still open and she had DES to LCX/OM.  Cath in 2016 showed this stent was occluded.  She has not had targets for redo CABG and she does not have good interventional targets.  No chest pain despite extensive disease.  - Continue ASA 81 and Crestor 10 mg daily.  3. Active smoking: She failed Chantix and Wellbutrin in the past.  - I strongly encouraged her to quit.  She is going to use a nicotine patch.  4. PAD: No definite diagnosis but weak pulses and legs feel "weak" generally.  - I will arrange for peripheral arterial dopplers.  5. Carotid stenosis: Due for repeat dopplers in 7/21 at 1 year.  6. Fe deficiency anemia: Has been FOBT negative.  Has had Ferahema.  Most recent hgb looks stable.   Followup in 3 wks.   Loralie Champagne 01/20/2020

## 2020-01-20 NOTE — H&P (View-Only) (Signed)
PCP: [name], MD Cardiology: Dr. Martinique HF Cardiology: Dr. Aundra Dubin  64 y.o. with history of CAD, ischemic cardiomyopathy, CVA, and valvular disease was referred by Dr. Martinique for CHF evaluation.  Patient has an extensive history of vascular disease.  She had CABG in 2007.  Repeat LHC in 2007 showed all grafts closed except LIMA-LAD.  Patient had DES to LCx/OM.  [place] 2016 showed occlusion of DES to LCx/OM with L-L and L-R collaterals. Poor targets for redo CABG.   In 2/21, echo was done showing EF 20-25% with moderate RV dysfunction, moderate-severe MR, moderate TR.  She continues to smoke, now down to 1/2 ppd from 2 ppd.  Surprisingly, PFTs in 2019 looked relatively normal. She additionally had the placement of a carotid stent on the right in 1/19.    Patient has had orthostatic symptoms and low BP has limited medication titration.  She occasionally gets lightheaded with standing.  Most recently, low dose Coreg dropped her SBP into the 70s.  She lives alone with her sister and nephew next door.  She moves slowly and fatigues easily.  She has significant exertional dyspnea with minimal activity.  She is unable to get to the mailbox. No chest pain.  She walks with a walker and is short of breath walking around her house.  This has been slowly progressive. She has struggled with volume overload.  Currently taking Lasix 40 mg bid and was started on metolazone 2.5 mg every other day due to progressive weight gain.  Weight has started to trend down on metolazone.    ECG (personally reviewed): NSR, PVC, old inferior MI, poor RWP, QTc 492 msec  Labs (4/21): K 3.7, creatinine 0.78, hgb 11.3  PMH: 1. CAD: CABG 2007.  - LHC in 2007 showed all grafts closed except LIMA-LAD.  Patient had DES to LCx/OM.   - [place] 2016 showed occlusion of DES to LCx/OM with L-L and L-R collaterals.  - Not candidate for redo CABG with poor targets.  2. Chronic systolic CHF: Ischemic cardiomyopathy.   - Echo (2/21): EF 20-25%,  moderately dilated and moderately dysfunctional RV, PASP 49, severe biatrial enlargement, moderate-severe MR, severe TR, mild-moderate AS, IVC dilated.  3. Fe deficiency anemia: FOBT negative.  4. Carotid stenosis: Right carotid stent in 1/19.  - Carotid dopplers (3/21): 22-48% LICA stenosis.  5. H/o CVA 6. HTN 7. Type 2 diabetes 8. Hyperlipidemia 9. Seizure disorder 10. OSA: Cannot tolerate CPAP.  11. Active smoker: PFTs in 2019 were relatively normal.  She does use oxygen at night.   Social History   Socioeconomic History  . Marital status: Divorced    Spouse name: Not on file  . Number of children: 0  . Years of education: Not on file  . Highest education level: Not on file  Occupational History  . Occupation: disabled  Tobacco Use  . Smoking status: Current Every Day Smoker    Packs/day: 1.00    Years: 51.00    Pack years: 51.00    Types: Cigarettes    Start date: 46  . Smokeless tobacco: Never Used  . Tobacco comment: 04/17/19 down to 1 pack her day--LCR  1.5 pack to 2 packs per day. Has been working on reducing the # of cigarettes she smokes, down to 1/2 PP7D  Substance and Sexual Activity  . Alcohol use: No  . Drug use: No  . Sexual activity: Not Currently    Partners: Male    Birth control/protection: None  Other Topics  Concern  . Not on file  Social History Narrative  . Not on file   Social Determinants of Health   Financial Resource Strain: Low Risk   . Difficulty of Paying Living Expenses: Not hard at all  Food Insecurity: No Food Insecurity  . Worried About Charity fundraiser in the Last Year: Never true  . Ran Out of Food in the Last Year: Never true  Transportation Needs: No Transportation Needs  . Lack of Transportation (Medical): No  . Lack of Transportation (Non-Medical): No  Physical Activity: Insufficiently Active  . Days of Exercise per Week: 2 days  . Minutes of Exercise per Session: 60 min  Stress: No Stress Concern Present  . Feeling of  Stress : Not at all  Social Connections:   . Frequency of Communication with Friends and Family:   . Frequency of Social Gatherings with Friends and Family:   . Attends Religious Services:   . Active Member of Clubs or Organizations:   . Attends Archivist Meetings:   [name] Marital Status:   Intimate Partner Violence: Not At Risk  . Fear of Current or Ex-Partner: No  . Emotionally Abused: No  . Physically Abused: No  . Sexually Abused: No   Family History  Problem Relation Age of Onset  . Heart disease Mother   . Diabetes Mother   . COPD Mother   . Hyperlipidemia Mother   . Hypertension Mother   . Cancer Father        met, origin unknown  . Heart attack Father   . Drug abuse Paternal Grandmother   . Stroke Paternal Grandfather   . Lung cancer Paternal Aunt        lung with mets to brain  . Melanoma Paternal Uncle   . Lung cancer Paternal Uncle        lung/liver to brain  . Cancer Paternal Uncle        cancer of unknown type  . Heart disease Maternal Grandfather   . Hypertension Sister   . Cancer Sister        eyelid  . Glaucoma Sister   . Heart disease Maternal Aunt        x 2 aunts   ROS: All systems reviewed and negative except as per HPI.   Current Outpatient Medications  Medication Sig Dispense Refill  . acetaminophen (TYLENOL) 500 MG tablet Take 1,000 mg by mouth every 6 (six) hours as needed for moderate pain or headache.    . albuterol (VENTOLIN HFA) 108 (90 Base) MCG/ACT inhaler INHALER 2 PUFFS EVERY 6 HOURS AS NEEDED FOR WHEEZING/SHORTNESS OF BREATH 18 g 0  . aspirin EC 81 MG tablet Take 81 mg by mouth every evening.  30 tablet 6  . clopidogrel (PLAVIX) 75 MG tablet Take 1 tablet (75 mg total) by mouth daily. 90 tablet 3  . dexlansoprazole (DEXILANT) 60 MG capsule TAKE 1 CAPSULE BY MOUTH EVERY DAY 90 capsule 3  . diphenhydrAMINE (BENADRYL) 25 mg capsule Take 25 mg by mouth 2 (two) times daily as needed for itching or allergies.    . fluticasone  (FLONASE) 50 MCG/ACT nasal spray Place 2 sprays into both nostrils daily. 48 g 1  . gabapentin (NEURONTIN) 300 MG capsule TAKE 2 CAPSULES BY MOUTH AT BEDTIME 180 capsule 1  . glimepiride (AMARYL) 2 MG tablet Take 2 mg by mouth daily. Only in the am    . glimepiride (AMARYL) 4 MG tablet Take 4 mg by  mouth daily. Only in the pm    . guaiFENesin (MUCINEX) 600 MG 12 hr tablet Take 2 tablets (1,200 mg total) by mouth 2 (two) times daily. 360 tablet 3  . iron polysaccharides (NU-IRON) 150 MG capsule Take 1 capsule (150 mg total) by mouth 2 (two) times daily. 180 capsule 3  . isosorbide mononitrate (IMDUR) 30 MG 24 hr tablet Take 1 tablet (30 mg total) by mouth daily. 90 tablet 3  . levETIRAcetam (KEPPRA) 1000 MG tablet TAKE 1 TABLET BY MOUTH TWICE A DAY 180 tablet 3  . metFORMIN (GLUCOPHAGE-XR) 500 MG 24 hr tablet Take 500 mg by mouth 2 (two) times daily.  6  . nicotine polacrilex (NICORETTE) 4 MG gum Take 1 each (4 mg total) by mouth as needed for smoking cessation. 90 tablet 2  . nitroGLYCERIN (NITROLINGUAL) 0.4 MG/SPRAY spray Place 1 spray under the tongue every 5 (five) minutes x 3 doses as needed for chest pain. 12 g 3  . nystatin (NYSTATIN) powder Apply topically 4 (four) times daily. 15 g 3  . ONE TOUCH ULTRA TEST test strip USE AS DIRECTED FOR TESTING BLOOD GLUCOSE 3 TIMES DAILY  1  . polyethylene glycol powder (GLYCOLAX/MIRALAX) powder TAKE 1 DOSE 17 GRAMS IN 8 OZ OF WATER DAILY AS NEEDED FOR CONSTIPATION. (Patient taking differently: Take 17 g by mouth daily. Take 1 dose 17 grams in 8 oz of water daily for constipation.) 527 g 3  . potassium chloride SA (KLOR-CON) 20 MEQ tablet Take 20 mEq by mouth 2 (two) times daily.    . promethazine (PHENERGAN) 25 MG tablet Take 1 tablet (25 mg total) by mouth every 8 (eight) hours as needed for nausea or vomiting. Caution of sedation 30 tablet 1  . ranolazine (RANEXA) 1000 MG SR tablet Take 1 tablet (1,000 mg total) by mouth 2 (two) times daily. 180 tablet 3   . rosuvastatin (CRESTOR) 10 MG tablet TAKE 1 TABLET BY MOUTH EVERY DAY 90 tablet 1  . sodium chloride (OCEAN) 0.65 % SOLN nasal spray Place 1 spray into both nostrils as needed for congestion.    . Tiotropium Bromide-Olodaterol (STIOLTO RESPIMAT) 2.5-2.5 MCG/ACT AERS Inhale 2 puffs into the lungs daily. 8 g 0  . TRESIBA FLEXTOUCH 100 UNIT/ML FlexTouch Pen Inject 5 Units into the skin daily.     . vitamin B-12 (CYANOCOBALAMIN) 1000 MCG tablet Take 1,000 mcg by mouth daily.    . vitamin C (ASCORBIC ACID) 250 MG tablet Take 1 tablet (250 mg total) by mouth daily. 90 tablet 3  . digoxin (LANOXIN) 0.125 MG tablet Take 0.5 tablets (0.0625 mg total) by mouth daily. 15 tablet 3  . torsemide (DEMADEX) 20 MG tablet Take 3 tablets (60 mg total) by mouth daily. 90 tablet 3   No current facility-administered medications for this encounter.   BP 100/82   Pulse 90   Wt 58.4 kg (128 lb 12.8 oz)   LMP 09/02/2003   SpO2 97%   BMI 27.87 kg/m  General: NAD Neck: JVP 8 cm with HJR, no thyromegaly or thyroid nodule.  Lungs: Rhonchi CV: Nondisplaced PMI.  Heart regular S1/S2, no S3/S4, 2/6 SEM RUSB.  No peripheral edema.  No carotid bruit.  Unable to palpate pedal pulses.  Abdomen: Soft, nontender, no hepatosplenomegaly, no distention.  Skin: Intact without lesions or rashes.  Neurologic: Alert and oriented x 3.  Psych: Normal affect. Extremities: No clubbing or cyanosis.  HEENT: Normal.    Assessment/Plan:  1. Chronic systolic CHF: Ischemic  cardiomyopathy.  Echo in 2/21 showed EF 20-25%, moderately dilated and moderately dysfunctional RV, PASP 49, severe biatrial enlargement, moderate-severe MR, severe TR, mild-moderate AS, IVC dilated. On exam today, she is mildly volume overloaded.  NYHA class IIIb symptoms, she is very limited.  Medication titration has been severely limited by hypotension and orthostatic symptoms.  I am concerned for low output HF.   - With suspicion for low output HF, start digoxin  0.0625 mg daily.  - I am concerned that renal function will suffer on every other day metolazone.  Instead, I will have her stop Lasix and start torsemide 60 mg daily.  If weight begins to rise, she can take periodic metolazone. BMET today and again in 10 days.  - She has not tolerated BP-active meds, avoid Coreg and Entresto for now.  May be able to tolerate spironolactone in the future.  - She needs RHC to assess filling pressures and cardiac output.  We discussed risks/benefits of procedure and she agrees.   - I will arrange for CPX.  - As above, concern for low output HF.  Medication titration will likely be limited.  We may need to consider her for LVAD (not transplant candidate with ongoing smoking).  This is complicated by her small size and her prior sternotomy.  However, her PFTs in 2019 looked surprisingly good.  2. CAD: s/p CABG 7654 complicated by early graft closure.  Repeat cath in 2007 showed only LIMA-LAD still open and she had DES to LCX/OM.  Cath in 2016 showed this stent was occluded.  She has not had targets for redo CABG and she does not have good interventional targets.  No chest pain despite extensive disease.  - Continue ASA 81 and Crestor 10 mg daily.  3. Active smoking: She failed Chantix and Wellbutrin in the past.  - I strongly encouraged her to quit.  She is going to use a nicotine patch.  4. PAD: No definite diagnosis but weak pulses and legs feel "weak" generally.  - I will arrange for peripheral arterial dopplers.  5. Carotid stenosis: Due for repeat dopplers in 7/21 at 1 year.  6. Fe deficiency anemia: Has been FOBT negative.  Has had Ferahema.  Most recent hgb looks stable.   Followup in 3 wks.   Loralie Champagne 01/20/2020

## 2020-01-21 ENCOUNTER — Other Ambulatory Visit (HOSPITAL_COMMUNITY): Payer: Self-pay | Admitting: *Deleted

## 2020-01-21 DIAGNOSIS — H52223 Regular astigmatism, bilateral: Secondary | ICD-10-CM | POA: Diagnosis not present

## 2020-01-21 DIAGNOSIS — I5022 Chronic systolic (congestive) heart failure: Secondary | ICD-10-CM

## 2020-01-21 DIAGNOSIS — H5203 Hypermetropia, bilateral: Secondary | ICD-10-CM | POA: Diagnosis not present

## 2020-01-21 DIAGNOSIS — H25813 Combined forms of age-related cataract, bilateral: Secondary | ICD-10-CM | POA: Diagnosis not present

## 2020-01-21 DIAGNOSIS — H524 Presbyopia: Secondary | ICD-10-CM | POA: Diagnosis not present

## 2020-01-21 MED ORDER — SODIUM CHLORIDE 0.9% FLUSH: 3.0000 mL | Freq: Two times a day (BID) | INTRAVENOUS | Status: DC

## 2020-01-22 ENCOUNTER — Other Ambulatory Visit: Payer: Self-pay

## 2020-01-22 ENCOUNTER — Inpatient Hospital Stay: Payer: Medicare Other

## 2020-01-22 ENCOUNTER — Ambulatory Visit: Payer: Medicare Other

## 2020-01-22 VITALS — BP 116/68 | HR 90 | Temp 97.7°F | Resp 18

## 2020-01-22 DIAGNOSIS — D509 Iron deficiency anemia, unspecified: Secondary | ICD-10-CM

## 2020-01-22 MED ORDER — ONDANSETRON HCL 4 MG/2ML IJ SOLN
INTRAMUSCULAR | Status: AC
  Filled 2020-01-22: qty 4

## 2020-01-22 MED ORDER — ACETAMINOPHEN 325 MG PO TABS
650.0000 mg | ORAL_TABLET | Freq: Once | ORAL | Status: AC
  Administered 2020-01-22: 650 mg via ORAL

## 2020-01-22 MED ORDER — ACETAMINOPHEN 325 MG PO TABS
ORAL_TABLET | ORAL | Status: AC
  Filled 2020-01-22: qty 2

## 2020-01-22 MED ORDER — SODIUM CHLORIDE 0.9 % IV SOLN
INTRAVENOUS | Status: DC
  Filled 2020-01-22: qty 250

## 2020-01-22 MED ORDER — SODIUM CHLORIDE 0.9 % IV SOLN
200.0000 mg | Freq: Once | INTRAVENOUS | Status: AC
  Administered 2020-01-22: 200 mg via INTRAVENOUS
  Filled 2020-01-22: qty 200

## 2020-01-22 MED ORDER — DIPHENHYDRAMINE HCL 25 MG PO CAPS
ORAL_CAPSULE | ORAL | Status: AC
  Filled 2020-01-22: qty 1

## 2020-01-22 MED ORDER — DIPHENHYDRAMINE HCL 25 MG PO CAPS
25.0000 mg | ORAL_CAPSULE | Freq: Once | ORAL | Status: AC
  Administered 2020-01-22: 25 mg via ORAL

## 2020-01-22 NOTE — Patient Instructions (Signed)

## 2020-01-25 ENCOUNTER — Other Ambulatory Visit (HOSPITAL_COMMUNITY): Payer: Medicare Other

## 2020-01-25 ENCOUNTER — Encounter (HOSPITAL_COMMUNITY): Payer: Medicare Other

## 2020-01-25 ENCOUNTER — Other Ambulatory Visit (HOSPITAL_COMMUNITY)
Admission: RE | Admit: 2020-01-25 | Discharge: 2020-01-25 | Disposition: A | Discharge status: Home / Self Care | Payer: Medicare Other | Source: Ambulatory Visit | Attending: Cardiology | Admitting: Cardiology

## 2020-01-25 DIAGNOSIS — Z01812 Encounter for preprocedural laboratory examination: Secondary | ICD-10-CM | POA: Diagnosis not present

## 2020-01-25 DIAGNOSIS — Z20822 Contact with and (suspected) exposure to covid-19: Secondary | ICD-10-CM | POA: Diagnosis not present

## 2020-01-25 LAB — SARS CORONAVIRUS 2 (TAT 6-24 HRS): SARS Coronavirus 2: NEGATIVE

## 2020-01-26 ENCOUNTER — Ambulatory Visit (INDEPENDENT_AMBULATORY_CARE_PROVIDER_SITE_OTHER): Payer: Medicare Other | Admitting: Cardiology

## 2020-01-26 ENCOUNTER — Ambulatory Visit (HOSPITAL_COMMUNITY)
Admission: RE | Admit: 2020-01-26 | Discharge: 2020-01-26 | Disposition: A | Discharge status: Home / Self Care | Payer: Medicare Other | Source: Ambulatory Visit | Attending: Cardiovascular Disease | Admitting: Cardiovascular Disease

## 2020-01-26 ENCOUNTER — Other Ambulatory Visit: Payer: Self-pay

## 2020-01-26 ENCOUNTER — Encounter: Payer: Self-pay | Admitting: Cardiology

## 2020-01-26 VITALS — BP 123/72 | HR 103 | Ht <= 58 in | Wt 133.4 lb

## 2020-01-26 DIAGNOSIS — Z23 Encounter for immunization: Secondary | ICD-10-CM | POA: Diagnosis not present

## 2020-01-26 DIAGNOSIS — Z794 Long term (current) use of insulin: Secondary | ICD-10-CM

## 2020-01-26 DIAGNOSIS — E1142 Type 2 diabetes mellitus with diabetic polyneuropathy: Secondary | ICD-10-CM

## 2020-01-26 DIAGNOSIS — I255 Ischemic cardiomyopathy: Secondary | ICD-10-CM

## 2020-01-26 DIAGNOSIS — I2510 Atherosclerotic heart disease of native coronary artery without angina pectoris: Secondary | ICD-10-CM

## 2020-01-26 DIAGNOSIS — I6523 Occlusion and stenosis of bilateral carotid arteries: Secondary | ICD-10-CM | POA: Diagnosis not present

## 2020-01-26 DIAGNOSIS — I5022 Chronic systolic (congestive) heart failure: Secondary | ICD-10-CM | POA: Diagnosis not present

## 2020-01-26 DIAGNOSIS — I739 Peripheral vascular disease, unspecified: Secondary | ICD-10-CM | POA: Diagnosis not present

## 2020-01-26 DIAGNOSIS — Z951 Presence of aortocoronary bypass graft: Secondary | ICD-10-CM

## 2020-01-26 NOTE — Patient Instructions (Addendum)
Take metolazone tonight.  Dr Aundra Dubin will direct you on subsequent dosing after your test tomorrow.  I will see you in 3 months

## 2020-01-26 NOTE — Progress Notes (Signed)
Please have her increase torsemide to 60 qam/40 qpm with BMET in 1 week. She has gained weight off metolazone, would like to see if we can keep her from having to take metolazone regularly but may end up needing it.

## 2020-01-27 ENCOUNTER — Telehealth (HOSPITAL_COMMUNITY): Payer: Self-pay

## 2020-01-27 ENCOUNTER — Encounter (HOSPITAL_COMMUNITY)
Admission: RE | Disposition: A | Discharge status: Home / Self Care | Payer: Self-pay | Source: Home / Self Care | Attending: Cardiology

## 2020-01-27 ENCOUNTER — Other Ambulatory Visit: Payer: Self-pay

## 2020-01-27 ENCOUNTER — Ambulatory Visit (HOSPITAL_COMMUNITY)
Admission: RE | Admit: 2020-01-27 | Discharge: 2020-01-27 | Disposition: A | Discharge status: Home / Self Care | Payer: Medicare Other | Attending: Cardiology | Admitting: Cardiology

## 2020-01-27 DIAGNOSIS — Z8249 Family history of ischemic heart disease and other diseases of the circulatory system: Secondary | ICD-10-CM | POA: Insufficient documentation

## 2020-01-27 DIAGNOSIS — Z7982 Long term (current) use of aspirin: Secondary | ICD-10-CM | POA: Insufficient documentation

## 2020-01-27 DIAGNOSIS — Z794 Long term (current) use of insulin: Secondary | ICD-10-CM | POA: Diagnosis not present

## 2020-01-27 DIAGNOSIS — G40909 Epilepsy, unspecified, not intractable, without status epilepticus: Secondary | ICD-10-CM | POA: Insufficient documentation

## 2020-01-27 DIAGNOSIS — Z9981 Dependence on supplemental oxygen: Secondary | ICD-10-CM | POA: Diagnosis not present

## 2020-01-27 DIAGNOSIS — Z7902 Long term (current) use of antithrombotics/antiplatelets: Secondary | ICD-10-CM | POA: Insufficient documentation

## 2020-01-27 DIAGNOSIS — Z951 Presence of aortocoronary bypass graft: Secondary | ICD-10-CM | POA: Insufficient documentation

## 2020-01-27 DIAGNOSIS — I2721 Secondary pulmonary arterial hypertension: Secondary | ICD-10-CM | POA: Diagnosis not present

## 2020-01-27 DIAGNOSIS — I255 Ischemic cardiomyopathy: Secondary | ICD-10-CM | POA: Insufficient documentation

## 2020-01-27 DIAGNOSIS — I6523 Occlusion and stenosis of bilateral carotid arteries: Secondary | ICD-10-CM | POA: Diagnosis not present

## 2020-01-27 DIAGNOSIS — D509 Iron deficiency anemia, unspecified: Secondary | ICD-10-CM | POA: Diagnosis not present

## 2020-01-27 DIAGNOSIS — Z8673 Personal history of transient ischemic attack (TIA), and cerebral infarction without residual deficits: Secondary | ICD-10-CM | POA: Diagnosis not present

## 2020-01-27 DIAGNOSIS — G4733 Obstructive sleep apnea (adult) (pediatric): Secondary | ICD-10-CM | POA: Diagnosis not present

## 2020-01-27 DIAGNOSIS — E785 Hyperlipidemia, unspecified: Secondary | ICD-10-CM | POA: Insufficient documentation

## 2020-01-27 DIAGNOSIS — F17210 Nicotine dependence, cigarettes, uncomplicated: Secondary | ICD-10-CM | POA: Diagnosis not present

## 2020-01-27 DIAGNOSIS — I5022 Chronic systolic (congestive) heart failure: Secondary | ICD-10-CM | POA: Insufficient documentation

## 2020-01-27 DIAGNOSIS — I2510 Atherosclerotic heart disease of native coronary artery without angina pectoris: Secondary | ICD-10-CM | POA: Insufficient documentation

## 2020-01-27 DIAGNOSIS — I110 Hypertensive heart disease with heart failure: Secondary | ICD-10-CM | POA: Insufficient documentation

## 2020-01-27 DIAGNOSIS — Z79899 Other long term (current) drug therapy: Secondary | ICD-10-CM | POA: Insufficient documentation

## 2020-01-27 DIAGNOSIS — I509 Heart failure, unspecified: Secondary | ICD-10-CM | POA: Diagnosis not present

## 2020-01-27 DIAGNOSIS — E119 Type 2 diabetes mellitus without complications: Secondary | ICD-10-CM | POA: Diagnosis not present

## 2020-01-27 DIAGNOSIS — Z801 Family history of malignant neoplasm of trachea, bronchus and lung: Secondary | ICD-10-CM | POA: Insufficient documentation

## 2020-01-27 HISTORY — PX: RIGHT HEART CATH: CATH118263

## 2020-01-27 LAB — GLUCOSE, CAPILLARY: Glucose-Capillary: 272 mg/dL — ABNORMAL HIGH (ref 70–99)

## 2020-01-27 LAB — POCT I-STAT EG7
Acid-Base Excess: 2 mmol/L (ref 0.0–2.0)
Acid-Base Excess: 3 mmol/L — ABNORMAL HIGH (ref 0.0–2.0)
Bicarbonate: 27.7 mmol/L (ref 20.0–28.0)
Bicarbonate: 29 mmol/L — ABNORMAL HIGH (ref 20.0–28.0)
Calcium, Ion: 1.25 mmol/L (ref 1.15–1.40)
Calcium, Ion: 1.29 mmol/L (ref 1.15–1.40)
HCT: 40 % (ref 36.0–46.0)
HCT: 40 % (ref 36.0–46.0)
Hemoglobin: 13.6 g/dL (ref 12.0–15.0)
Hemoglobin: 13.6 g/dL (ref 12.0–15.0)
O2 Saturation: 67 %
O2 Saturation: 69 %
Potassium: 3.5 mmol/L (ref 3.5–5.1)
Potassium: 3.5 mmol/L (ref 3.5–5.1)
Sodium: 138 mmol/L (ref 135–145)
Sodium: 139 mmol/L (ref 135–145)
TCO2: 29 mmol/L (ref 22–32)
TCO2: 30 mmol/L (ref 22–32)
pCO2, Ven: 48 mmHg (ref 44.0–60.0)
pCO2, Ven: 49.3 mmHg (ref 44.0–60.0)
pH, Ven: 7.369 (ref 7.250–7.430)
pH, Ven: 7.377 (ref 7.250–7.430)
pO2, Ven: 36 mmHg (ref 32.0–45.0)
pO2, Ven: 38 mmHg (ref 32.0–45.0)

## 2020-01-27 LAB — POTASSIUM: Potassium: 3.3 mmol/L — ABNORMAL LOW (ref 3.5–5.1)

## 2020-01-27 SURGERY — RIGHT HEART CATH
Anesthesia: LOCAL

## 2020-01-27 MED ORDER — LABETALOL HCL 5 MG/ML IV SOLN: 10.0000 mg | INTRAVENOUS | Status: DC | PRN

## 2020-01-27 MED ORDER — LIDOCAINE HCL (PF) 1 % IJ SOLN
INTRAMUSCULAR | Status: DC | PRN
  Administered 2020-01-27: 2 mL

## 2020-01-27 MED ORDER — ONDANSETRON HCL 4 MG/2ML IJ SOLN: 4.0000 mg | Freq: Four times a day (QID) | INTRAMUSCULAR | Status: DC | PRN

## 2020-01-27 MED ORDER — HEPARIN (PORCINE) IN NACL 1000-0.9 UT/500ML-% IV SOLN
INTRAVENOUS | Status: DC | PRN
  Administered 2020-01-27: 500 mL

## 2020-01-27 MED ORDER — HYDRALAZINE HCL 20 MG/ML IJ SOLN: 10.0000 mg | INTRAMUSCULAR | Status: DC | PRN

## 2020-01-27 MED ORDER — SODIUM CHLORIDE 0.9% FLUSH: 3.0000 mL | INTRAVENOUS | Status: DC | PRN

## 2020-01-27 MED ORDER — ACETAMINOPHEN 325 MG PO TABS: 650.0000 mg | ORAL_TABLET | ORAL | Status: DC | PRN

## 2020-01-27 MED ORDER — SODIUM CHLORIDE 0.9% FLUSH: 3.0000 mL | Freq: Two times a day (BID) | INTRAVENOUS | Status: DC

## 2020-01-27 MED ORDER — TORSEMIDE 20 MG PO TABS
60.0000 mg | ORAL_TABLET | Freq: Every day | ORAL | 3 refills | Status: DC
Start: 2020-01-27 — End: 2020-02-10

## 2020-01-27 MED ORDER — SODIUM CHLORIDE 0.9 % IV SOLN: 250.0000 mL | INTRAVENOUS | Status: DC | PRN

## 2020-01-27 MED ORDER — POTASSIUM CHLORIDE CRYS ER 20 MEQ PO TBCR: 40.0000 meq | EXTENDED_RELEASE_TABLET | Freq: Two times a day (BID) | ORAL | 6 refills | Status: DC

## 2020-01-27 MED ORDER — TORSEMIDE 20 MG PO TABS: 40.0000 mg | ORAL_TABLET | Freq: Every evening | ORAL | 6 refills | Status: DC

## 2020-01-27 MED ORDER — SODIUM CHLORIDE 0.9 % IV SOLN: INTRAVENOUS | Status: DC

## 2020-01-27 MED ORDER — LIDOCAINE HCL (PF) 1 % IJ SOLN
INTRAMUSCULAR | Status: AC
  Filled 2020-01-27: qty 30

## 2020-01-27 SURGICAL SUPPLY — 5 items
CATH BALLN WEDGE 5F 110CM (CATHETERS) ×1 IMPLANT
KIT HEART LEFT (KITS) ×2 IMPLANT
PACK CARDIAC CATHETERIZATION (CUSTOM PROCEDURE TRAY) ×2 IMPLANT
SHEATH GLIDE SLENDER 4/5FR (SHEATH) ×1 IMPLANT
TRANSDUCER W/STOPCOCK (MISCELLANEOUS) ×2 IMPLANT

## 2020-01-27 NOTE — Telephone Encounter (Signed)
-----   Message from Larey Dresser, MD sent at 01/26/2020 11:40 PM EDT ----- Significant PAD, may be causing her sensation of "weak legs."  Please refer for PV evaluation, Dr. Gwenlyn Found or Dr. Fletcher Anon.

## 2020-01-27 NOTE — Interval H&P Note (Signed)
History and Physical Interval Note:  01/27/2020 9:00 AM  Diane [name]  has presented today for surgery, with the diagnosis of congestive heart failure.  The various methods of treatment have been discussed with the patient and family. After consideration of risks, benefits and other options for treatment, the patient has consented to  Procedure(s): RIGHT HEART CATH (N/A) as a surgical intervention.  The patient's history has been reviewed, patient examined, no change in status, stable for surgery.  I have reviewed the patient's chart and labs.  Questions were answered to the patient's satisfaction.     Emmajane Altamura [employer]

## 2020-01-27 NOTE — Telephone Encounter (Signed)
-----   Message from Larey Dresser, MD sent at 01/27/2020  9:27 AM EDT ----- Please have her increase torsemide to 60 qam/40 qpm and KCl to 40 bid.  Call us on Monday if weight has increased, may need a dose of metolazone.

## 2020-01-27 NOTE — Telephone Encounter (Signed)
LM for patient to return call.

## 2020-01-27 NOTE — Telephone Encounter (Signed)
-----   Message from Larey Dresser, MD sent at 01/27/2020  9:33 AM EDT ----- Also with Mrs Wildes, when you refer her for PV evaluation, have her see Dr. Gwenlyn Found (she saw him in the past).

## 2020-01-27 NOTE — Discharge Instructions (Signed)
1. Increase torsemide to 60 mg (3 pills) in the morning and 40 mg (2 pills) in the evening.  2. Increase potassium to 40 mEq (2 pills) twice a day  Radial Site Care  This sheet gives you information about how to care for yourself after your procedure. Your health care provider may also give you more specific instructions. If you have problems or questions, contact your health care provider. What can I expect after the procedure? After the procedure, it is common to have:  Bruising and tenderness at the catheter insertion area. Follow these instructions at home: Medicines  Take over-the-counter and prescription medicines only as told by your health care provider. Insertion site care  Follow instructions from your health care provider about how to take care of your insertion site. Make sure you: ? Wash your hands with soap and water before you change your bandage (dressing). If soap and water are not available, use hand sanitizer. ? Change your dressing as told by your health care provider. ? Leave stitches (sutures), skin glue, or adhesive strips in place. These skin closures may need to stay in place for 2 weeks or longer. If adhesive strip edges start to loosen and curl up, you may trim the loose edges. Do not remove adhesive strips completely unless your health care provider tells you to do that.  Check your insertion site every day for signs of infection. Check for: ? Redness, swelling, or pain. ? Fluid or blood. ? Pus or a bad smell. ? Warmth.  Do not take baths, swim, or use a hot tub until your health care provider approves.  You may shower 24-48 hours after the procedure, or as directed by your health care provider. ? Remove the dressing and gently wash the site with plain soap and water. ? Pat the area dry with a clean towel. ? Do not rub the site. That could cause bleeding.  Do not apply powder or lotion to the site. Activity   For 24 hours after the procedure, or as  directed by your health care provider: ? Do not flex or bend the affected arm. ? Do not push or pull heavy objects with the affected arm. ? Do not drive yourself home from the hospital or clinic. You may drive 24 hours after the procedure unless your health care provider tells you not to. ? Do not operate machinery or power tools.  Do not lift anything that is heavier than 10 lb (4.5 kg), or the limit that you are told, until your health care provider says that it is safe.  Ask your health care provider when it is okay to: ? Return to work or school. ? Resume usual physical activities or sports. ? Resume sexual activity. General instructions  If the catheter site starts to bleed, raise your arm and put firm pressure on the site. If the bleeding does not stop, get help right away. This is a medical emergency.  If you went home on the same day as your procedure, a responsible adult should be with you for the first 24 hours after you arrive home.  Keep all follow-up visits as told by your health care provider. This is important. Contact a health care provider if:  You have a fever.  You have redness, swelling, or yellow drainage around your insertion site. Get help right away if:  You have unusual pain at the radial site.  The catheter insertion area swells very fast.  The insertion area is bleeding,  and the bleeding does not stop when you hold steady pressure on the area.  Your arm or hand becomes pale, cool, tingly, or numb. These symptoms may represent a serious problem that is an emergency. Do not wait to see if the symptoms will go away. Get medical help right away. Call your local emergency services (911 in the [place] Do not drive yourself to the hospital. Summary  After the procedure, it is common to have bruising and tenderness at the site.  Follow instructions from your health care provider about how to take care of your radial site wound. Check the wound every day for  signs of infection.  Do not lift anything that is heavier than 10 lb (4.5 kg), or the limit that you are told, until your health care provider says that it is safe. This information is not intended to replace advice given to you by your health care provider. Make sure you discuss any questions you have with your health care provider. Document Revised: 10/23/2017 Document Reviewed: 10/23/2017 Elsevier Patient Education  2020 [employer].

## 2020-01-28 NOTE — Telephone Encounter (Signed)
Called patient to review orders to increase torsemide.  Pt states she was told to increase torsemide yesterday and was clear on instructions. Advised to call office on Monday if her weight increased. Verbalized understanding.

## 2020-01-28 NOTE — Telephone Encounter (Signed)
-----   Message from Larey Dresser, MD sent at 01/26/2020 11:40 PM EDT ----- Significant PAD, may be causing her sensation of "weak legs."  Please refer for PV evaluation, Dr. Gwenlyn Found or Dr. Fletcher Anon.

## 2020-01-29 ENCOUNTER — Other Ambulatory Visit: Payer: Self-pay

## 2020-01-29 ENCOUNTER — Ambulatory Visit: Payer: Medicare Other

## 2020-01-29 ENCOUNTER — Telehealth (HOSPITAL_COMMUNITY): Payer: Self-pay

## 2020-01-29 ENCOUNTER — Inpatient Hospital Stay: Payer: Medicare Other

## 2020-01-29 VITALS — BP 115/62 | HR 92 | Temp 98.2°F | Resp 18

## 2020-01-29 DIAGNOSIS — I739 Peripheral vascular disease, unspecified: Secondary | ICD-10-CM

## 2020-01-29 DIAGNOSIS — D509 Iron deficiency anemia, unspecified: Secondary | ICD-10-CM | POA: Diagnosis not present

## 2020-01-29 MED ORDER — ACETAMINOPHEN 325 MG PO TABS
650.0000 mg | ORAL_TABLET | Freq: Once | ORAL | Status: AC
  Administered 2020-01-29: 650 mg via ORAL

## 2020-01-29 MED ORDER — SODIUM CHLORIDE 0.9 % IV SOLN
200.0000 mg | Freq: Once | INTRAVENOUS | Status: AC
  Administered 2020-01-29: 200 mg via INTRAVENOUS
  Filled 2020-01-29: qty 200

## 2020-01-29 MED ORDER — SODIUM CHLORIDE 0.9 % IV SOLN
INTRAVENOUS | Status: DC
  Filled 2020-01-29: qty 250

## 2020-01-29 MED ORDER — DIPHENHYDRAMINE HCL 25 MG PO CAPS
ORAL_CAPSULE | ORAL | Status: AC
  Filled 2020-01-29: qty 1

## 2020-01-29 MED ORDER — ACETAMINOPHEN 325 MG PO TABS
ORAL_TABLET | ORAL | Status: AC
  Filled 2020-01-29: qty 2

## 2020-01-29 MED ORDER — DIPHENHYDRAMINE HCL 25 MG PO CAPS
25.0000 mg | ORAL_CAPSULE | Freq: Once | ORAL | Status: AC
  Administered 2020-01-29: 25 mg via ORAL

## 2020-01-29 NOTE — Telephone Encounter (Signed)
Orders Placed This Encounter  Procedures  . Ambulatory referral to Cardiology    Referral Priority:   Routine    Referral Type:   Consultation    Referral Reason:   Specialty Services Required    Referred to Provider:   Lorretta Harp, MD    Requested Specialty:   Cardiology    Number of Visits Requested:   1

## 2020-01-29 NOTE — Patient Instructions (Signed)

## 2020-02-02 ENCOUNTER — Telehealth: Payer: Self-pay | Admitting: Adult Health

## 2020-02-02 NOTE — Telephone Encounter (Signed)
Called patient and talked to her about COVID homebound vaccination program.  She has already received two doses of the [place] vaccine.  Thanked her for her time.  Wilber Bihari, NP

## 2020-02-05 ENCOUNTER — Other Ambulatory Visit (HOSPITAL_COMMUNITY)
Admission: RE | Admit: 2020-02-05 | Discharge: 2020-02-05 | Disposition: A | Discharge status: Home / Self Care | Payer: Medicare Other | Source: Ambulatory Visit | Attending: Cardiovascular Disease | Admitting: Cardiovascular Disease

## 2020-02-05 DIAGNOSIS — Z01812 Encounter for preprocedural laboratory examination: Secondary | ICD-10-CM | POA: Insufficient documentation

## 2020-02-05 DIAGNOSIS — Z20822 Contact with and (suspected) exposure to covid-19: Secondary | ICD-10-CM | POA: Diagnosis not present

## 2020-02-05 LAB — SARS CORONAVIRUS 2 (TAT 6-24 HRS): SARS Coronavirus 2: NEGATIVE

## 2020-02-09 ENCOUNTER — Ambulatory Visit (HOSPITAL_COMMUNITY): Payer: Medicare Other | Attending: Cardiology

## 2020-02-09 ENCOUNTER — Other Ambulatory Visit: Payer: Self-pay

## 2020-02-09 DIAGNOSIS — I5022 Chronic systolic (congestive) heart failure: Secondary | ICD-10-CM | POA: Diagnosis not present

## 2020-02-10 ENCOUNTER — Encounter: Payer: Self-pay | Admitting: Cardiovascular Disease

## 2020-02-10 ENCOUNTER — Telehealth: Payer: Self-pay | Admitting: Family Medicine

## 2020-02-10 ENCOUNTER — Ambulatory Visit (INDEPENDENT_AMBULATORY_CARE_PROVIDER_SITE_OTHER): Payer: Medicare Other | Admitting: Cardiovascular Disease

## 2020-02-10 DIAGNOSIS — I255 Ischemic cardiomyopathy: Secondary | ICD-10-CM | POA: Diagnosis not present

## 2020-02-10 DIAGNOSIS — I739 Peripheral vascular disease, unspecified: Secondary | ICD-10-CM

## 2020-02-10 NOTE — Progress Notes (Signed)
Diane [name]   04/08/1956  [id number]  Primary Physician Tower, Diane Fanny, [name] Primary Cardiologist: Diane [name] Garret [name], [place], [place]  HPI:  Diane [name] is a 64 y.o.  Caucasian female with a history of CAD status post coronary artery bypass grafting March 2007 by Dr. [name]. I last saw her in the office 07/03/2017.. She's had subsequent stenting and known occluded grafts except for her her LIMA. She has severe LV dysfunction with moderate pulmonary hypertension. I last saw her in the office 06/20/16. Her mother, Diane [name], was a long-term patient of mine as well. Her other Problems include diabetes, hypokalemia, hypertension and ongoing tobacco abuse. She's had progression of her bilateral carotid artery stenosis left greater than right . She is neurologically asymptomatic. She is high risk for carotid endarterectomy. I performed cerebral angiography and her 11/15/15 revealing 70% bilateral ICA stenosis with pins beyond the stenosis and significant calcification on the left making stenting not appropriate. Her carotid Dopplers performed on 06/26/16 showed stable bilateral moderate ICA stenosis and lower extremity Dopplers performed 12/12/16 revealed ABIs 0.93 bilaterally with moderate but not severe disease. She does complain of some leg pain which has some atypical characteristics for claudication  She does continue to smoke but is trying to stop.  She has ischemic cardiomyopathy with an EF of 20% range followed by Dr. Algernon [name].  Her last cath in 2016 by Dr. [place] revealed occluded grafts with poor targets for redo CABG.  I did perform carotid stenting on her with Dr. Trula [name] 10/10/2017.  She has less tolerating claudication with ABIs in the 0.7-0.8 range bilaterally.  She has high-frequency signal in her proximal right SFA.  Current Meds  Medication Sig  . acetaminophen (TYLENOL) 500 MG tablet Take 1,000 mg by mouth every 6 (six) hours as needed for moderate pain or headache.  .  albuterol (VENTOLIN HFA) 108 (90 Base) MCG/ACT inhaler INHALER 2 PUFFS EVERY 6 HOURS AS NEEDED FOR WHEEZING/SHORTNESS OF BREATH (Patient taking differently: Inhale 2 puffs into the lungs every 6 (six) hours as needed for wheezing or shortness of breath. )  . aspirin EC 81 MG tablet Take 81 mg by mouth every evening.   . clopidogrel (PLAVIX) 75 MG tablet Take 1 tablet (75 mg total) by mouth daily.  Diane [name] dexlansoprazole (DEXILANT) 60 MG capsule TAKE 1 CAPSULE BY MOUTH EVERY DAY (Patient taking differently: Take 60 mg by mouth daily. )  . digoxin (LANOXIN) 0.125 MG tablet Take 0.5 tablets (0.0625 mg total) by mouth daily.  . diphenhydrAMINE (BENADRYL) 25 mg capsule Take 25 mg by mouth 2 (two) times daily as needed for itching or allergies.  . fluticasone (FLONASE) 50 MCG/ACT nasal spray Place 2 sprays into both nostrils daily.  Diane [name] gabapentin (NEURONTIN) 300 MG capsule TAKE 2 CAPSULES BY MOUTH AT BEDTIME (Patient taking differently: Take 600 mg by mouth at bedtime. )  . glimepiride (AMARYL) 2 MG tablet Take 2 mg by mouth in the morning.   Diane [name] glimepiride (AMARYL) 4 MG tablet Take 4 mg by mouth every evening.   Diane [name] guaiFENesin (MUCINEX) 600 MG 12 hr tablet Take 2 tablets (1,200 mg total) by mouth 2 (two) times daily.  . iron polysaccharides (NU-IRON) 150 MG capsule Take 1 capsule (150 mg total) by mouth 2 (two) times daily.  . isosorbide mononitrate (IMDUR) 30 MG 24 hr tablet Take 1 tablet (30 mg total) by mouth daily.  Diane [name] levETIRAcetam (KEPPRA) 1000 MG tablet TAKE 1 TABLET BY MOUTH TWICE A  DAY (Patient taking differently: Take 1,000 mg by mouth 2 (two) times daily. )  . metFORMIN (GLUCOPHAGE-XR) 500 MG 24 hr tablet Take 500 mg by mouth every evening.   . metolazone (ZAROXOLYN) 2.5 MG tablet Take 2.5 mg by mouth daily as needed (fluid retention).  . nicotine (NICODERM CQ - DOSED IN MG/24 HOURS) 21 mg/24hr patch Place 21 mg onto the skin daily.  . nicotine polacrilex (NICORETTE) 4 MG gum Take 1 each (4 mg total)  by mouth as needed for smoking cessation.  . nitroGLYCERIN (NITROLINGUAL) 0.4 MG/SPRAY spray Place 1 spray under the tongue every 5 (five) minutes x 3 doses as needed for chest pain.  Diane [name] nystatin (NYSTATIN) powder Apply topically 4 (four) times daily. (Patient taking differently: Apply 1 application topically 4 (four) times daily as needed (yeast infections). )  . ONE TOUCH ULTRA TEST test strip USE AS DIRECTED FOR TESTING BLOOD GLUCOSE 3 TIMES DAILY  . polyethylene glycol powder (GLYCOLAX/MIRALAX) powder TAKE 1 DOSE 17 GRAMS IN 8 OZ OF WATER DAILY AS NEEDED FOR CONSTIPATION. (Patient taking differently: Take 17 g by mouth daily. )  . potassium chloride SA (KLOR-CON) 20 MEQ tablet Take 2 tablets (40 mEq total) by mouth 2 (two) times daily.  . promethazine (PHENERGAN) 25 MG tablet Take 1 tablet (25 mg total) by mouth every 8 (eight) hours as needed for nausea or vomiting. Caution of sedation  . ranolazine (RANEXA) 1000 MG SR tablet Take 1 tablet (1,000 mg total) by mouth 2 (two) times daily.  . rosuvastatin (CRESTOR) 10 MG tablet TAKE 1 TABLET BY MOUTH EVERY DAY (Patient taking differently: Take 10 mg by mouth daily. )  . sodium chloride (OCEAN) 0.65 % SOLN nasal spray Place 1 spray into both nostrils as needed for congestion.  . Tiotropium Bromide-Olodaterol (STIOLTO RESPIMAT) 2.5-2.5 MCG/ACT AERS Inhale 2 puffs into the lungs daily.  Diane [name] torsemide (DEMADEX) 20 MG tablet Take 2 tablets (40 mg total) by mouth every evening.  Diane [name] FLEXTOUCH 100 UNIT/ML FlexTouch Pen Inject 8 Units into the skin daily.   . vitamin B-12 (CYANOCOBALAMIN) 1000 MCG tablet Take 1,000 mcg by mouth daily.  . vitamin C (ASCORBIC ACID) 250 MG tablet Take 1 tablet (250 mg total) by mouth daily.     Allergies  Allergen Reactions  . Bee Venom Itching and Swelling  . Benadryl [Diphenhydramine] Other (See Comments)    Insomnia   . Entresto [Sacubitril-Valsartan] Cough  . Tetracycline     Unknown, pt cannot recall exact  reaction  . Ace Inhibitors Cough    Social History   Socioeconomic History  . Marital status: Divorced    Spouse name: Not on file  . Number of children: 0  . Years of education: Not on file  . Highest education level: Not on file  Occupational History  . Occupation: disabled  Tobacco Use  . Smoking status: Current Every Day Smoker    Packs/day: 1.00    Years: 51.00    Pack years: 51.00    Types: Cigarettes    Start date: 38  . Smokeless tobacco: Never Used  . Tobacco comment: 04/17/19 down to 1 pack her day--LCR  1.5 pack to 2 packs per day. Has been working on reducing the # of cigarettes she smokes, down to 1/2 PP7D  Substance and Sexual Activity  . Alcohol use: No  . Drug use: No  . Sexual activity: Not Currently    Partners: Male    Birth control/protection: None  Other  Topics Concern  . Not on file  Social History Narrative  . Not on file   Social Determinants of Health   Financial Resource Strain: Low Risk   . Difficulty of Paying Living Expenses: Not hard at all  Food Insecurity: No Food Insecurity  . Worried About Charity fundraiser in the Last Year: Never true  . Ran Out of Food in the Last Year: Never true  Transportation Needs: No Transportation Needs  . Lack of Transportation (Medical): No  . Lack of Transportation (Non-Medical): No  Physical Activity: Insufficiently Active  . Days of Exercise per Week: 2 days  . Minutes of Exercise per Session: 60 min  Stress: No Stress Concern Present  . Feeling of Stress : Not at all  Social Connections:   . Frequency of Communication with Friends and Family:   . Frequency of Social Gatherings with Friends and Family:   . Attends Religious Services:   . Active Member of Clubs or Organizations:   . Attends Archivist Meetings:   Diane [name] Marital Status:   Intimate Partner Violence: Not At Risk  . Fear of Current or Ex-Partner: No  . Emotionally Abused: No  . Physically Abused: No  . Sexually Abused: No      Review of Systems: General: negative for chills, fever, night sweats or weight changes.  Cardiovascular: negative for chest pain, dyspnea on exertion, edema, orthopnea, palpitations, paroxysmal nocturnal dyspnea or shortness of breath Dermatological: negative for rash Respiratory: negative for cough or wheezing Urologic: negative for hematuria Abdominal: negative for nausea, vomiting, diarrhea, bright red blood per rectum, melena, or hematemesis Neurologic: negative for visual changes, syncope, or dizziness All other systems reviewed and are otherwise negative except as noted above.    Blood pressure 118/68, pulse 97, height 4\' 10"  (1.473 m), weight 132 lb (59.9 kg), last menstrual period 09/02/2003, SpO2 95 %.  General appearance: alert and no distress Neck: no adenopathy, no carotid bruit, no JVD, supple, symmetrical, trachea midline and thyroid not enlarged, symmetric, no tenderness/mass/nodules Lungs: clear to auscultation bilaterally Heart: regular rate and rhythm, S1, S2 normal, no murmur, click, rub or gallop Extremities: extremities normal, atraumatic, no cyanosis or edema Pulses: 2+ and symmetric Skin: Skin color, texture, turgor normal. No rashes or lesions Neurologic: Alert and oriented X 3, normal strength and tone. Normal symmetric reflexes. Normal coordination and gait  EKG not performed today  ASSESSMENT AND PLAN:   Claudication in peripheral vascular disease [facility]) Ms. [place] was sent to me by Dr. Aundra Dubin for evaluation of symptomatic PAD.  She does have a history of right internal carotid artery stenting by myself and Dr. Trula [name] 10/10/2017.  She also has severe ischemic cardiomyopathy status post remote bypass grafting with occluded grafts.  She is being considered for LVAD therapy.  She does have claudication which is lifestyle limiting with recent Dopplers performed 01/26/2020 revealing ABIs of 0.76 on the right and 0.86 on the left.  She has a high-frequency signal  in her proximal right SFA.  We decided to pause on endovascular therapy until her cardiac issues are sorted out.  I will see her back in 6 months.      Diane [name] FACP,FACC,FAHA, [facility] 02/10/2020 2:29 PM

## 2020-02-10 NOTE — Assessment & Plan Note (Signed)
Diane [name] was sent to me by Dr. Aundra Dubin for evaluation of symptomatic PAD.  She does have a history of right internal carotid artery stenting by myself and Dr. Trula Slade 10/10/2017.  She also has severe ischemic cardiomyopathy status post remote bypass grafting with occluded grafts.  She is being considered for LVAD therapy.  She does have claudication which is lifestyle limiting with recent Dopplers performed 01/26/2020 revealing ABIs of 0.76 on the right and 0.86 on the left.  She has a high-frequency signal in her proximal right SFA.  We decided to pause on endovascular therapy until her cardiac issues are sorted out.  I will see her back in 6 months.

## 2020-02-10 NOTE — Patient Instructions (Signed)
Medication Instructions:  NO CHANGE *If you need a refill on your cardiac medications before your next appointment, please call your pharmacy*   Lab Work: If you have labs (blood work) drawn today and your tests are completely normal, you will receive your results only by: [name] MyChart Message (if you have MyChart) OR . A paper copy in the mail If you have any lab test that is abnormal or we need to change your treatment, we will call you to review the results.   Follow-Up: At [facility], you and your health needs are our priority.  As part of our continuing mission to provide you with exceptional heart care, we have created designated Provider Care Teams.  These Care Teams include your primary Cardiologist (physician) and Advanced Practice Providers (APPs -  Physician Assistants and Nurse Practitioners) who all work together to provide you with the care you need, when you need it.  We recommend signing up for the patient portal called "MyChart".  Sign up information is provided on this After Visit Summary.  MyChart is used to connect with patients for Virtual Visits (Telemedicine).  Patients are able to view lab/test results, encounter notes, upcoming appointments, etc.  Non-urgent messages can be sent to your provider as well.   To learn more about what you can do with MyChart, go to [email or web address].    Your next appointment:   6 month(s)  The format for your next appointment:   Either In Person or Virtual  Provider:   You may see Quay Burow MD or one of the following Advanced Practice Providers on your designated Care Team:    Kerin Ransom, PA-C  [place], [place]  Coletta Memos, [place]

## 2020-02-10 NOTE — Chronic Care Management (AMB) (Signed)
  Chronic Care Management   Outreach Note  02/10/2020 Name: SALIHAH PECKHAM MRN: [id number] DOB: 11-23-1955  Referred by: Tower, [name], MD Reason for referral : Chronic Care Management   An unsuccessful telephone outreach was attempted today. The patient was referred to the pharmacist for assistance with care management and care coordination.   Follow Up Plan:   [place]

## 2020-02-11 ENCOUNTER — Telehealth: Payer: Medicare Other

## 2020-02-11 ENCOUNTER — Other Ambulatory Visit (HOSPITAL_COMMUNITY): Payer: Self-pay | Admitting: Cardiology

## 2020-02-12 ENCOUNTER — Telehealth: Payer: Self-pay | Admitting: Family Medicine

## 2020-02-12 ENCOUNTER — Other Ambulatory Visit: Payer: Self-pay

## 2020-02-12 ENCOUNTER — Encounter (HOSPITAL_COMMUNITY): Payer: Self-pay | Admitting: Cardiology

## 2020-02-12 ENCOUNTER — Ambulatory Visit (HOSPITAL_COMMUNITY)
Admission: RE | Admit: 2020-02-12 | Discharge: 2020-02-12 | Disposition: A | Discharge status: Home / Self Care | Payer: Medicare Other | Source: Ambulatory Visit | Attending: Cardiology | Admitting: Cardiology

## 2020-02-12 VITALS — BP 112/72 | HR 93 | Wt 131.8 lb

## 2020-02-12 DIAGNOSIS — Z833 Family history of diabetes mellitus: Secondary | ICD-10-CM | POA: Diagnosis not present

## 2020-02-12 DIAGNOSIS — D509 Iron deficiency anemia, unspecified: Secondary | ICD-10-CM | POA: Diagnosis not present

## 2020-02-12 DIAGNOSIS — Z8249 Family history of ischemic heart disease and other diseases of the circulatory system: Secondary | ICD-10-CM | POA: Diagnosis not present

## 2020-02-12 DIAGNOSIS — Z794 Long term (current) use of insulin: Secondary | ICD-10-CM | POA: Diagnosis not present

## 2020-02-12 DIAGNOSIS — Z79899 Other long term (current) drug therapy: Secondary | ICD-10-CM | POA: Diagnosis not present

## 2020-02-12 DIAGNOSIS — I110 Hypertensive heart disease with heart failure: Secondary | ICD-10-CM | POA: Insufficient documentation

## 2020-02-12 DIAGNOSIS — F17210 Nicotine dependence, cigarettes, uncomplicated: Secondary | ICD-10-CM | POA: Insufficient documentation

## 2020-02-12 DIAGNOSIS — Z8673 Personal history of transient ischemic attack (TIA), and cerebral infarction without residual deficits: Secondary | ICD-10-CM | POA: Diagnosis not present

## 2020-02-12 DIAGNOSIS — Z825 Family history of asthma and other chronic lower respiratory diseases: Secondary | ICD-10-CM | POA: Diagnosis not present

## 2020-02-12 DIAGNOSIS — Z801 Family history of malignant neoplasm of trachea, bronchus and lung: Secondary | ICD-10-CM | POA: Diagnosis not present

## 2020-02-12 DIAGNOSIS — Z951 Presence of aortocoronary bypass graft: Secondary | ICD-10-CM | POA: Diagnosis not present

## 2020-02-12 DIAGNOSIS — E1151 Type 2 diabetes mellitus with diabetic peripheral angiopathy without gangrene: Secondary | ICD-10-CM | POA: Diagnosis not present

## 2020-02-12 DIAGNOSIS — I6522 Occlusion and stenosis of left carotid artery: Secondary | ICD-10-CM | POA: Insufficient documentation

## 2020-02-12 DIAGNOSIS — I5022 Chronic systolic (congestive) heart failure: Secondary | ICD-10-CM

## 2020-02-12 DIAGNOSIS — E785 Hyperlipidemia, unspecified: Secondary | ICD-10-CM | POA: Insufficient documentation

## 2020-02-12 DIAGNOSIS — I739 Peripheral vascular disease, unspecified: Secondary | ICD-10-CM

## 2020-02-12 DIAGNOSIS — G40909 Epilepsy, unspecified, not intractable, without status epilepticus: Secondary | ICD-10-CM | POA: Insufficient documentation

## 2020-02-12 DIAGNOSIS — G4733 Obstructive sleep apnea (adult) (pediatric): Secondary | ICD-10-CM | POA: Insufficient documentation

## 2020-02-12 DIAGNOSIS — Z823 Family history of stroke: Secondary | ICD-10-CM | POA: Diagnosis not present

## 2020-02-12 DIAGNOSIS — I2510 Atherosclerotic heart disease of native coronary artery without angina pectoris: Secondary | ICD-10-CM

## 2020-02-12 DIAGNOSIS — Z7982 Long term (current) use of aspirin: Secondary | ICD-10-CM | POA: Insufficient documentation

## 2020-02-12 DIAGNOSIS — Z955 Presence of coronary angioplasty implant and graft: Secondary | ICD-10-CM | POA: Diagnosis not present

## 2020-02-12 DIAGNOSIS — Z8349 Family history of other endocrine, nutritional and metabolic diseases: Secondary | ICD-10-CM | POA: Insufficient documentation

## 2020-02-12 DIAGNOSIS — I255 Ischemic cardiomyopathy: Secondary | ICD-10-CM | POA: Insufficient documentation

## 2020-02-12 LAB — BASIC METABOLIC PANEL
Anion gap: 13 (ref 5–15)
BUN: 16 mg/dL (ref 8–23)
CO2: 29 mmol/L (ref 22–32)
Calcium: 9 mg/dL (ref 8.9–10.3)
Chloride: 93 mmol/L — ABNORMAL LOW (ref 98–111)
Creatinine, Ser: 0.63 mg/dL (ref 0.44–1.00)
GFR calc Af Amer: >60 mL/min (ref >60)
GFR calc non Af Amer: >60 mL/min (ref >60)
Glucose, Bld: 208 mg/dL — ABNORMAL HIGH (ref 70–99)
Potassium: 3.5 mmol/L (ref 3.5–5.1)
Sodium: 135 mmol/L (ref 135–145)

## 2020-02-12 LAB — DIGOXIN LEVEL: Digoxin Level: 0.6 ng/mL — ABNORMAL LOW (ref 0.8–2.0)

## 2020-02-12 MED ORDER — TORSEMIDE 20 MG PO TABS: ORAL_TABLET | ORAL | 4 refills | Status: DC

## 2020-02-12 MED ORDER — SPIRONOLACTONE 25 MG PO TABS
12.5000 mg | ORAL_TABLET | Freq: Every day | ORAL | 3 refills | Status: DC
Start: 2020-02-12 — End: 2020-03-24

## 2020-02-12 MED ORDER — METOLAZONE 2.5 MG PO TABS: 2.5000 mg | ORAL_TABLET | ORAL | 4 refills | Status: DC

## 2020-02-12 NOTE — Patient Instructions (Addendum)
TAKE Metolazone 2.5mg  (1 tab) weekly on MONDAYS with an extra Potassium 20 meq (1 tab)  START Spironolactone 12.5mg  (1/2 tab) every night  CONTINUE Torsemide 60mg  (3 tabs) in the morning and 40mg  (2 tabs) in the evening  Labs today We will only contact you if something comes back abnormal or we need to make some changes. Otherwise no news is good news!  Your physician recommends that you schedule a follow-up appointment in: 10 days with the VAD clinic and Dr Aundra Dubin. You will get a call to schedule this appointment   Please call office at [phone] option 2 if you have any questions or concerns.   At the [facility], you and your health needs are our priority. As part of our continuing mission to provide you with exceptional heart care, we have created designated Provider Care Teams. These Care Teams include your primary Cardiologist (physician) and Advanced Practice Providers (APPs- Physician Assistants and Nurse Practitioners) who all work together to provide you with the care you need, when you need it.   You may see any of the following providers on your designated Care Team at your next follow up: [name] Dr Glori Bickers . Dr Loralie Champagne . Darrick Grinder, NP . Lyda Jester, PA . Audry Riles, PharmD   Please be sure to bring in all your medications bottles to every appointment.

## 2020-02-12 NOTE — Chronic Care Management (AMB) (Signed)
  Chronic Care Management   Outreach Note  02/12/2020 Name: Diane [name] MRN: [id number] DOB: 1956/06/02  Referred by: Tower, [name], MD Reason for referral : Chronic Care Management   A second unsuccessful telephone outreach was attempted today. The patient was referred to pharmacist for assistance with care management and care coordination.  Follow Up Plan:   [place]

## 2020-02-14 NOTE — Progress Notes (Signed)
PCP: Diane Greenspan, MD Cardiology: Dr. Martinique HF Cardiology: Dr. Aundra [name]  64 y.o. with history of CAD, ischemic cardiomyopathy, CVA, and valvular disease was referred by Dr. Martinique for CHF evaluation.  Patient has an extensive history of vascular disease.  She had CABG in 2007.  Repeat LHC in 2007 showed all grafts closed except LIMA-LAD.  Patient had DES to LCx/OM.  [place] 2016 showed occlusion of DES to LCx/OM with L-L and L-R collaterals. Poor targets for redo CABG.   In 2/21, echo was done showing EF 20-25% with moderate RV dysfunction, moderate-severe MR, moderate TR.  She continues to smoke, now down to 1/2 ppd from 2 ppd.  Surprisingly, PFTs in 2019 looked relatively normal. She additionally had the placement of a carotid stent on the right in 1/19.    RHC was done in 4/21, showing near-normal filling pressures with CI 2.12 by Fick.  CPX in 5/21 showed peak VO2 13, VE/VCO2 slope 37 => moderate HF limitation.  The PFTs on the CPX were not markedly abnormal. She saw Dr. Gwenlyn [name] recently, peripheral arterial dopplers in 4/21 showed significant stenosis right SFA.   Patient returns for followup of CHF.  She has had orthostatic symptoms and low BP has limited medication titration.  No chest pain.  She remains short of breath walking 20-30 feet (around her house).  She gets bilateral calf pain after walking about the same distance.  No rest pain or pedal ulcers.  She uses a rollator.  She was short of breath walking into the office today.  Since last appointment, she has taken metolazone twice.  Weight is up 3 lbs. She is down to about 5 cigarettes/day with nicotine patch.   Labs (4/21): K 3.7 => 3.4, creatinine 0.78 => 0.7, hgb 11.3 => 14.4  PMH: 1. CAD: CABG 2007.  - LHC in 2007 showed all grafts closed except LIMA-LAD.  Patient had DES to LCx/OM.   - [place] 2016 showed occlusion of DES to LCx/OM with L-L and L-R collaterals.  - Not candidate for redo CABG with poor targets.  2. Chronic systolic CHF:  Ischemic cardiomyopathy.   - Echo (2/21): EF 20-25%, moderately dilated and moderately dysfunctional RV, PASP 49, severe biatrial enlargement, moderate-severe MR, severe TR, mild-moderate AS, IVC dilated.  - RHC (4/21): mean RA 6, PA 41/16, mean 29, mean PCWP 15, CI 2.12, PVR 4.3 WU - CPX (5/21): peak VO2 13, VE/VCO2 slope 37, RER 1.06 => moderate HF limitation.  3. Fe deficiency anemia: FOBT negative.  4. Carotid stenosis: Right carotid stent in 1/19.  - Carotid dopplers (3/24): 40-10% LICA stenosis.  5. H/o CVA 6. HTN 7. Type 2 diabetes 8. Hyperlipidemia 9. Seizure disorder 10. OSA: Cannot tolerate CPAP.  11. Active smoker: PFTs in 2019 were relatively normal.  She does use oxygen at night.  - PFTs (5/21): FEV1 82%, FVC 80%, ratio 102% 12. PAD: ABIs (4/21) 0.76 on right, 0.86 on left => significant right SFA stenosis.   Social History   Socioeconomic History  . Marital status: Divorced    Spouse name: Not on file  . Number of children: 0  . Years of education: Not on file  . Highest education level: Not on file  Occupational History  . Occupation: disabled  Tobacco Use  . Smoking status: Current Every Day Smoker    Packs/day: 1.00    Years: 51.00    Pack years: 51.00    Types: Cigarettes    Start date: 63  .  Smokeless tobacco: Never Used  . Tobacco comment: 04/17/19 down to 1 pack her day--LCR  1.5 pack to 2 packs per day. Has been working on reducing the # of cigarettes she smokes, down to 1/2 PP7D  Substance and Sexual Activity  . Alcohol use: No  . Drug use: No  . Sexual activity: Not Currently    Partners: Male    Birth control/protection: None  Other Topics Concern  . Not on file  Social History Narrative  . Not on file   Social Determinants of Health   Financial Resource Strain: Low Risk   . Difficulty of Paying Living Expenses: Not hard at all  Food Insecurity: No Food Insecurity  . Worried About Running Out of Food in the Last Year: Never true  . Ran  Out of Food in the Last Year: Never true  Transportation Needs: No Transportation Needs  . Lack of Transportation (Medical): No  . Lack of Transportation (Non-Medical): No  Physical Activity: Insufficiently Active  . Days of Exercise per Week: 2 days  . Minutes of Exercise per Session: 60 min  Stress: No Stress Concern Present  . Feeling of Stress : Not at all  Social Connections:   . Frequency of Communication with Friends and Family:   . Frequency of Social Gatherings with Friends and Family:   . Attends Religious Services:   . Active Member of Clubs or Organizations:   . Attends Club or Organization Meetings:   . Marital Status:   Intimate Partner Violence: Not At Risk  . Fear of Current or Ex-Partner: No  . Emotionally Abused: No  . Physically Abused: No  . Sexually Abused: No   Family History  Problem Relation Age of Onset  . Heart disease Mother   . Diabetes Mother   . COPD Mother   . Hyperlipidemia Mother   . Hypertension Mother   . Cancer Father        met, origin unknown  . Heart attack Father   . Drug abuse Paternal Grandmother   . Stroke Paternal Grandfather   . Lung cancer Paternal Aunt        lung with mets to brain  . Melanoma Paternal Uncle   . Lung cancer Paternal Uncle        lung/liver to brain  . Cancer Paternal Uncle        cancer of unknown type  . Heart disease Maternal Grandfather   . Hypertension Sister   . Cancer Sister        eyelid  . Glaucoma Sister   . Heart disease Maternal Aunt        x 2 aunts   ROS: All systems reviewed and negative except as per HPI.   Current Outpatient Medications  Medication Sig Dispense Refill  . acetaminophen (TYLENOL) 500 MG tablet Take 1,000 mg by mouth every 6 (six) hours as needed for moderate pain or headache.    . albuterol (VENTOLIN HFA) 108 (90 Base) MCG/ACT inhaler INHALER 2 PUFFS EVERY 6 HOURS AS NEEDED FOR WHEEZING/SHORTNESS OF BREATH (Patient taking differently: Inhale 2 puffs into the lungs  every 6 (six) hours as needed for wheezing or shortness of breath. ) 18 g 0  . aspirin EC 81 MG tablet Take 81 mg by mouth every evening.  30 tablet 6  . clopidogrel (PLAVIX) 75 MG tablet Take 1 tablet (75 mg total) by mouth daily. 90 tablet 3  . dexlansoprazole (DEXILANT) 60 MG capsule Take 60 mg by   mouth daily.    . diphenhydrAMINE (BENADRYL) 25 mg capsule Take 25 mg by mouth 2 (two) times daily as needed for itching or allergies.    . fluticasone (FLONASE) 50 MCG/ACT nasal spray Place 2 sprays into both nostrils daily. 48 g 1  . gabapentin (NEURONTIN) 300 MG capsule TAKE 2 CAPSULES BY MOUTH AT BEDTIME (Patient taking differently: Take 600 mg by mouth at bedtime. ) 180 capsule 1  . glimepiride (AMARYL) 2 MG tablet Take 2 mg by mouth in the morning.     . glimepiride (AMARYL) 4 MG tablet Take 4 mg by mouth every evening.     . guaiFENesin (MUCINEX) 600 MG 12 hr tablet Take 2 tablets (1,200 mg total) by mouth 2 (two) times daily. 360 tablet 3  . iron polysaccharides (NU-IRON) 150 MG capsule Take 1 capsule (150 mg total) by mouth 2 (two) times daily. 180 capsule 3  . isosorbide mononitrate (IMDUR) 30 MG 24 hr tablet Take 1 tablet (30 mg total) by mouth daily. 90 tablet 3  . levETIRAcetam (KEPPRA) 1000 MG tablet Take 1,000 mg by mouth 2 (two) times daily.    . metFORMIN (GLUCOPHAGE-XR) 500 MG 24 hr tablet Take 500 mg by mouth every evening.   6  . metolazone (ZAROXOLYN) 2.5 MG tablet Take 1 tablet (2.5 mg total) by mouth once a week. On mondays 10 tablet 4  . nicotine (NICODERM CQ - DOSED IN MG/24 HOURS) 21 mg/24hr patch Place 21 mg onto the skin daily.    . nicotine polacrilex (NICORETTE) 4 MG gum Take 1 each (4 mg total) by mouth as needed for smoking cessation. 90 tablet 2  . nitroGLYCERIN (NITROLINGUAL) 0.4 MG/SPRAY spray Place 1 spray under the tongue every 5 (five) minutes x 3 doses as needed for chest pain. 12 g 3  . nystatin (NYSTATIN) powder Apply topically 4 (four) times daily. (Patient  taking differently: Apply 1 application topically 4 (four) times daily as needed (yeast infections). ) 15 g 3  . ONE TOUCH ULTRA TEST test strip USE AS DIRECTED FOR TESTING BLOOD GLUCOSE 3 TIMES DAILY  1  . polyethylene glycol powder (GLYCOLAX/MIRALAX) powder TAKE 1 DOSE 17 GRAMS IN 8 OZ OF WATER DAILY AS NEEDED FOR CONSTIPATION. (Patient taking differently: Take 17 g by mouth daily. ) 527 g 3  . potassium chloride SA (KLOR-CON) 20 MEQ tablet Take 2 tablets (40 mEq total) by mouth 2 (two) times daily. 60 tablet 6  . promethazine (PHENERGAN) 25 MG tablet Take 1 tablet (25 mg total) by mouth every 8 (eight) hours as needed for nausea or vomiting. Caution of sedation 30 tablet 1  . ranolazine (RANEXA) 1000 MG SR tablet Take 1 tablet (1,000 mg total) by mouth 2 (two) times daily. 180 tablet 3  . rosuvastatin (CRESTOR) 10 MG tablet Take 10 mg by mouth daily.    . sodium chloride (OCEAN) 0.65 % SOLN nasal spray Place 1 spray into both nostrils as needed for congestion.    . Tiotropium Bromide-Olodaterol (STIOLTO RESPIMAT) 2.5-2.5 MCG/ACT AERS Inhale 2 puffs into the lungs daily. 8 g 0  . torsemide (DEMADEX) 20 MG tablet Take 3 tablets (60 mg total) by mouth every morning AND 2 tablets (40 mg total) every evening. 150 tablet 4  . TRESIBA FLEXTOUCH 100 UNIT/ML FlexTouch Pen Inject 8 Units into the skin daily.     . digoxin (LANOXIN) 0.125 MG tablet TAKE 0.5 TABLETS (0.0625 MG TOTAL) BY MOUTH DAILY. 45 tablet 3  . spironolactone (  ALDACTONE) 25 MG tablet Take 0.5 tablets (12.5 mg total) by mouth at bedtime. 45 tablet 3   No current facility-administered medications for this encounter.   BP 112/72   Pulse 93   Wt 59.8 kg (131 lb 12.8 oz)   LMP 09/02/2003   SpO2 96%   BMI 27.55 kg/m  General: NAD Neck: No JVD, no thyromegaly or thyroid nodule.  Lungs: Clear to auscultation bilaterally with normal respiratory effort. CV: Nondisplaced PMI.  Heart regular S1/S2, no S3/S4, 2/6 SEM RUSB (clear S2).  No  peripheral edema.  No carotid bruit.  Unable to palpate pedal pulses.  Abdomen: Soft, nontender, no hepatosplenomegaly, no distention.  Skin: Intact without lesions or rashes.  Neurologic: Alert and oriented x 3.  Psych: Normal affect. Extremities: No clubbing or cyanosis.  HEENT: Normal.   Assessment/Plan:  1. Chronic systolic CHF: Ischemic cardiomyopathy.  Echo in 2/21 showed EF 20-25%, moderately dilated and moderately dysfunctional RV, PASP 49, severe biatrial enlargement, moderate-severe MR, severe TR, mild-moderate AS, IVC dilated. CPX (5/21) with moderate HF limitation, RHC (4/21) with CI low at 2.12.  Medication titration has been severely limited by hypotension and orthostatic symptoms.  NYHA class IV symptoms, very limited.  She does not appear significantly volume overloaded today.  - Continue digoxin 0.0625 mg daily, check level today.   - Continue torsemide 60 qam/40 qpm.  She will take metolazone 2.5 mg once a week on Mondays. BMET 2 wks.  - Add spironolactone 12.5 mg, take qhs.  - She has not tolerated BP-active meds well, avoid Coreg and Entresto for now.   - NYHA class IV symptoms with marginal cardiac index (2.1).  Moderate HF limitation on CPX.  Medication titration will likely be limited.  We may need to consider her for LVAD in the future (not transplant candidate with ongoing smoking).  This is complicated by her small size and her prior sternotomy, as well as PAD.  However, her PFTs on CPX were relatively unremarkable. She has not identified a potential caregiver yet.  I will have her followup in LVAD clinic in 10-14 days.  2. CAD: s/p CABG 2007 complicated by early graft closure.  Repeat cath in 2007 showed only LIMA-LAD still open and she had DES to LCX/OM.  Cath in 2016 showed this stent was occluded.  She has not had targets for redo CABG and she does not have good interventional targets.  No chest pain despite extensive disease.  - Continue ASA 81 and Crestor 10 mg daily.   3. Active smoking: She failed Chantix and Wellbutrin in the past but has been slowly titrating down her smoking.  - She is now down to 5 cigs/day, continue to aim for abstinence.   4. PAD: Significant right SFA stenosis with claudication, no rest symptoms or ulcers.  She was seen by Dr. Barry who recommended dealing with her heart issues first then coming back to look at the right SFA. However, unless we decide to push forward soon with LVAD, it may be best to go ahead and intervene on her right SFA to allow increased activity. Will discuss with LVAD team.   5. Carotid stenosis: Due for repeat dopplers in 7/21 at 1 year.  6. Fe deficiency anemia: Has been FOBT negative.  Has had Ferahema.  Most recent hgb looks stable.   Followup in 10-14 days in LVAD clinic.   Diane [name] 02/14/2020   

## 2020-02-16 ENCOUNTER — Telehealth: Payer: Self-pay | Admitting: Family Medicine

## 2020-02-16 NOTE — Telephone Encounter (Signed)
Received call from Maurine Minister for [facility]. A couple of months ago they requested a hospital bed and an over the bed table for the patient. The patient received the bed but not the table. After calling [facility] [employer] and calling the [facility] back, the table costs $150.00 and is not covered by Medicare at all and never covered by insurance. It is only covered when a patient is on [place] and this patient is on Palliative Care. I reached out to Lukisha and she cant afford to buy this table right now so we dont need to send them anything. They will contact Almyra Free the RN and let her know.

## 2020-02-17 ENCOUNTER — Telehealth: Payer: Self-pay | Admitting: Family Medicine

## 2020-02-17 NOTE — Progress Notes (Signed)
  Chronic Care Management   Outreach Note  02/17/2020 Name: Diane [name] MRN: [id number] DOB: [birth year]  Referred by: Tower, [name], MD Reason for referral : No chief complaint on file.   An unsuccessful telephone outreach was attempted today. The patient was referred to the pharmacist for assistance with care management and care coordination.   This note is not being shared with the patient for the following reason: To respect privacy (The patient or proxy has requested that the information not be shared).  Follow Up Plan:   Earney [place] [facility] Scheduler

## 2020-02-22 ENCOUNTER — Other Ambulatory Visit: Payer: Self-pay | Admitting: Family Medicine

## 2020-02-22 ENCOUNTER — Encounter (HOSPITAL_COMMUNITY): Payer: Medicare Other

## 2020-02-22 NOTE — Telephone Encounter (Signed)
Last filled on 04/23/19, please advise

## 2020-02-24 ENCOUNTER — Ambulatory Visit (HOSPITAL_COMMUNITY)
Admission: RE | Admit: 2020-02-24 | Discharge: 2020-02-24 | Disposition: A | Discharge status: Home / Self Care | Payer: Medicare Other | Source: Ambulatory Visit | Attending: Internal Medicine | Admitting: Internal Medicine

## 2020-02-24 ENCOUNTER — Other Ambulatory Visit: Payer: Self-pay

## 2020-02-24 VITALS — BP 116/68 | HR 85 | Ht <= 58 in | Wt 132.2 lb

## 2020-02-24 DIAGNOSIS — I110 Hypertensive heart disease with heart failure: Secondary | ICD-10-CM | POA: Diagnosis not present

## 2020-02-24 DIAGNOSIS — E1151 Type 2 diabetes mellitus with diabetic peripheral angiopathy without gangrene: Secondary | ICD-10-CM | POA: Diagnosis not present

## 2020-02-24 DIAGNOSIS — Z951 Presence of aortocoronary bypass graft: Secondary | ICD-10-CM | POA: Diagnosis not present

## 2020-02-24 DIAGNOSIS — I2510 Atherosclerotic heart disease of native coronary artery without angina pectoris: Secondary | ICD-10-CM | POA: Insufficient documentation

## 2020-02-24 DIAGNOSIS — Z833 Family history of diabetes mellitus: Secondary | ICD-10-CM | POA: Diagnosis not present

## 2020-02-24 DIAGNOSIS — I25708 Atherosclerosis of coronary artery bypass graft(s), unspecified, with other forms of angina pectoris: Secondary | ICD-10-CM | POA: Diagnosis not present

## 2020-02-24 DIAGNOSIS — E785 Hyperlipidemia, unspecified: Secondary | ICD-10-CM | POA: Insufficient documentation

## 2020-02-24 DIAGNOSIS — I739 Peripheral vascular disease, unspecified: Secondary | ICD-10-CM

## 2020-02-24 DIAGNOSIS — G4733 Obstructive sleep apnea (adult) (pediatric): Secondary | ICD-10-CM | POA: Insufficient documentation

## 2020-02-24 DIAGNOSIS — Z8673 Personal history of transient ischemic attack (TIA), and cerebral infarction without residual deficits: Secondary | ICD-10-CM | POA: Diagnosis not present

## 2020-02-24 DIAGNOSIS — Z79899 Other long term (current) drug therapy: Secondary | ICD-10-CM | POA: Diagnosis not present

## 2020-02-24 DIAGNOSIS — Z794 Long term (current) use of insulin: Secondary | ICD-10-CM | POA: Insufficient documentation

## 2020-02-24 DIAGNOSIS — G40909 Epilepsy, unspecified, not intractable, without status epilepticus: Secondary | ICD-10-CM | POA: Insufficient documentation

## 2020-02-24 DIAGNOSIS — I255 Ischemic cardiomyopathy: Secondary | ICD-10-CM

## 2020-02-24 DIAGNOSIS — Z8349 Family history of other endocrine, nutritional and metabolic diseases: Secondary | ICD-10-CM | POA: Insufficient documentation

## 2020-02-24 DIAGNOSIS — I5022 Chronic systolic (congestive) heart failure: Secondary | ICD-10-CM | POA: Diagnosis not present

## 2020-02-24 DIAGNOSIS — Z955 Presence of coronary angioplasty implant and graft: Secondary | ICD-10-CM | POA: Diagnosis not present

## 2020-02-24 DIAGNOSIS — F17210 Nicotine dependence, cigarettes, uncomplicated: Secondary | ICD-10-CM | POA: Diagnosis not present

## 2020-02-24 DIAGNOSIS — I6529 Occlusion and stenosis of unspecified carotid artery: Secondary | ICD-10-CM | POA: Insufficient documentation

## 2020-02-24 DIAGNOSIS — Z8249 Family history of ischemic heart disease and other diseases of the circulatory system: Secondary | ICD-10-CM | POA: Insufficient documentation

## 2020-02-24 DIAGNOSIS — Z7982 Long term (current) use of aspirin: Secondary | ICD-10-CM | POA: Insufficient documentation

## 2020-02-24 LAB — CBC
HCT: 49.1 % — ABNORMAL HIGH (ref 36.0–46.0)
Hemoglobin: 16 g/dL — ABNORMAL HIGH (ref 12.0–15.0)
MCH: 27.7 pg (ref 26.0–34.0)
MCHC: 32.6 g/dL (ref 30.0–36.0)
MCV: 85.1 fL (ref 80.0–100.0)
Platelets: 227 10*3/uL (ref 150–400)
RBC: 5.77 MIL/uL — ABNORMAL HIGH (ref 3.87–5.11)
RDW: 17.2 % — ABNORMAL HIGH (ref 11.5–15.5)
WBC: 6.1 10*3/uL (ref 4.0–10.5)
nRBC: 0 % (ref 0.0–0.2)

## 2020-02-24 LAB — COMPREHENSIVE METABOLIC PANEL
ALT: 19 U/L (ref 0–44)
AST: 18 U/L (ref 15–41)
Albumin: 3.8 g/dL (ref 3.5–5.0)
Alkaline Phosphatase: 84 U/L (ref 38–126)
Anion gap: 13 (ref 5–15)
BUN: 27 mg/dL — ABNORMAL HIGH (ref 8–23)
CO2: 28 mmol/L (ref 22–32)
Calcium: 9.5 mg/dL (ref 8.9–10.3)
Chloride: 92 mmol/L — ABNORMAL LOW (ref 98–111)
Creatinine, Ser: 0.74 mg/dL (ref 0.44–1.00)
GFR calc Af Amer: >60 mL/min (ref >60)
GFR calc non Af Amer: >60 mL/min (ref >60)
Glucose, Bld: 267 mg/dL — ABNORMAL HIGH (ref 70–99)
Potassium: 4 mmol/L (ref 3.5–5.1)
Sodium: 133 mmol/L — ABNORMAL LOW (ref 135–145)
Total Bilirubin: 0.7 mg/dL (ref 0.3–1.2)
Total Protein: 6.7 g/dL (ref 6.5–8.1)

## 2020-02-24 LAB — LIPID PANEL
Cholesterol: 165 mg/dL (ref 0–200)
HDL: 38 mg/dL — ABNORMAL LOW (ref >40)
LDL Cholesterol: 91 mg/dL (ref 0–99)
Total CHOL/HDL Ratio: 4.3 RATIO
Triglycerides: 180 mg/dL — ABNORMAL HIGH (ref <150)
VLDL: 36 mg/dL (ref 0–40)

## 2020-02-24 LAB — HEMOGLOBIN A1C
Hgb A1c MFr Bld: 8.6 % — ABNORMAL HIGH (ref 4.8–5.6)
Mean Plasma Glucose: 200.12 mg/dL

## 2020-02-24 LAB — HEPATITIS B CORE ANTIBODY, TOTAL: Hep B Core Total Ab: NONREACTIVE

## 2020-02-24 LAB — APTT: aPTT: 27 seconds (ref 24–36)

## 2020-02-24 LAB — HEPATITIS B SURFACE ANTIGEN: Hepatitis B Surface Ag: NONREACTIVE

## 2020-02-24 LAB — URIC ACID: Uric Acid, Serum: 6.8 mg/dL (ref 2.5–7.1)

## 2020-02-24 LAB — HIV ANTIBODY (ROUTINE TESTING W REFLEX): HIV Screen 4th Generation wRfx: NONREACTIVE

## 2020-02-24 LAB — HEPATITIS B SURFACE ANTIBODY,QUALITATIVE: Hep B S Ab: NONREACTIVE

## 2020-02-24 LAB — TSH: TSH: 3.029 u[IU]/mL (ref 0.350–4.500)

## 2020-02-24 LAB — PROTIME-INR
INR: 1 (ref 0.8–1.2)
Prothrombin Time: 12.4 seconds (ref 11.4–15.2)

## 2020-02-24 LAB — DIGOXIN LEVEL: Digoxin Level: 0.8 ng/mL (ref 0.8–2.0)

## 2020-02-24 LAB — LACTATE DEHYDROGENASE: LDH: 152 U/L (ref 98–192)

## 2020-02-24 LAB — MAGNESIUM: Magnesium: 1.7 mg/dL (ref 1.7–2.4)

## 2020-02-24 LAB — PREALBUMIN: Prealbumin: 23.3 mg/dL (ref 18–38)

## 2020-02-24 MED ORDER — ISOSORBIDE MONONITRATE ER 30 MG PO TB24: 60.0000 mg | ORAL_TABLET | Freq: Every day | ORAL | 3 refills | Status: DC

## 2020-02-24 MED ORDER — DAPAGLIFLOZIN PROPANEDIOL 10 MG PO TABS
10.0000 mg | ORAL_TABLET | Freq: Every day | ORAL | 3 refills | Status: DC
Start: 2020-02-24 — End: 2020-03-24

## 2020-02-24 NOTE — Progress Notes (Addendum)
Patient presented to VAD clinic today alone for a VAD consult.    Pt arrived using her rollator. States she has moments of feeling dizzy and that her legs feel weak often.   Pt states that she hasn't smoked a cigarette since Monday night. She states that she is wearing the patch and chewing nicorette gum. She states that she plans to not smoke anymore.     Vital Signs:  Automatic BP: 116/68 HR:  85 SPO2:  96%   Weight:132.2  lbs Last weight:   lbs    Symptom Yes No Details  Angina       yes     Activity: pt has taken 3 nitro tabs since the last time she saw Dr. Aundra Dubin  Claudication  yes      How far: a few steps  Syncope       No When:  Stroke  yes       history  Orthopnea        Yes   How many pillows:  sleeps in hospital so she can raise HOB  PND       No How often:  CPAP        No How many hrs:    Pedal edema       No    Abd fullness       No    N&V       No  Fair appetite  Diaphoresis       No When:  SOB        yes  Walking from room to room  Palpitations       No When:  ICD shock       No    Hospitalizations     When/where/why:  ED visit     When/where/why:  Other MD     When/who/why:  Activity     Pt is unable to do much and stays at home most of the time  Fluid     Drinks < 2L per day  Diet                           VAD evaluation consent reviewed and signed by Mrs. Faxon. Initial VAD teaching completed with pt and caregiver.   VAD educational packet including "Understanding Your Options with Advanced Heart Failure", "[place] Patient Agreement for VAD Evaluation and Potential Implantation" consent, and [employer] "Living a More Active Life" HM III booklet", "[place] HM III Patient Education", " Mechanical Circulatory Support Program", and "Decision Aids for Left Ventricular Assist Device" reviewed in detail and left at bedside for continued reference.  All questions answered regarding VAD implant, hospital stay, and what to expect when discharged home  living with a heart pump. Pt identified her sister as her primary caregiver.  Explained need for 24/7 care when pt is discharged home due to sternal precautions, adaptation to living on support, emotional support, consistent and meticulous exit site care and management, medication adherence and high volume of follow up visits with the [facility] after discharge; both pt and caregiver verbalized understanding of above.   Explained that LVAD can be implanted for two indications in the setting of advanced left ventricular heart failure treatment:  1. Bridge to transplant - used for patients who cannot safely wait for heart transplant without this device.  Or    2. Destination therapy - used for patients until end of life or  recovery of heart function.  Patient and caregiver(s) acknowledge that the indication at this point in time for LVAD therapy would be for DT due to smoking.   Provided brief equipment overview and demonstration with HeartMate III training loop including discussion on the following:   a) power module   b) system controller   c) universal Charity fundraiser   d) battery clips   e) Batteries   f)  Perc lock   g) Percutaneous lead   Demonstrated and discussed:  a) changing power source on system controller from tethered (MPU) to untethered (battery) mode   b) changing power source on system controller from untethered (battery) to tethered (MPU) mode   c) how to monitor battery life both on the system controller and on each individual battery   d) changing batteries   Reviewed and supplied a copy of home inspection check list stressing that only three pronged grounded power outlets can be used for VAD equipment. Ms. Loch confirmed home has electrical outlets that will support the equipment along with access working telephone.  Identified the following lifestyle modifications while living on MCS:    1. No driving for at least three months and then only if doctor gives  permission to do so.   2. No tub baths while pump implanted, and shower only when doctor gives permission.   3. No swimming or submersion in water while implanted with pump.   4. No contact sports or engaging in jumping activities.   5. Always have a backup controller, charged spare batteries, and battery clips nearby at all times in case of emergency.   6. Call the doctor or hospital contact person if any change in how the pump sounds, feels, or works.   7. Plan to sleep only when connected to the power module.   8. Do not sleep on your stomach.   9. Keep a backup system controller, charged batteries, battery clips, and flashlight near you during sleep in case of electrical power outage.   10. Exit site care including dressing changes, monitoring for infection, and importance of keeping percutaneous lead stabilized at all times.     One of our current patients visited with her today to answer questions about living on and caring for someone on [facility] assist with decision making.   Reviewed pictures of VAD drive line, site care, dressing changes, and drive line stabilization including securement attachment device and abdominal binder. Discussed with pt and family that they will be required to purchase dressing supplies as long as patient has the VAD in place.   Reinforced need for 24 hour/7 day week caregivers. She will also need to abide by sternal precautions with no lifting >10lbs, pushing, pulling and will need assistance with adapting to new life style with VAD equipment and care.   Intermacs patient survival statistics through December 2020 reviewed with patient and caregiver as follows:                                                The patient understands that from this discussion it does not mean that they will receive the device, but that depends on an extensive evaluation process. The patient is aware of the fact that if at anytime they want to stop the evaluation process they  can.  All questions have been answered at this time and  contact information was provided should she encounter any further questions.  She is agreeable at this time to the evaluation process and will move forward.   All labs were drawn in clinic today. Her tests will be scheduled and appt made with a CT surgeon.  REDs vest reading : 33  Patient Instructions: 1. Increase Imdur to 60 mg (2 tablets daily) 2. Start Farxiga 10 mg daily-pt given a 30 day free card 3. We will call you with your procedure schedule.  Total Session Time: 90 minutes  Tanda Rockers, RN VAD Coordinator   Office: [phone] 24/7 VAD Pager: [phone]  PCP: [name], MD Cardiology: Dr. Martinique HF Cardiology: Dr. Aundra Dubin  64 y.o. with history of CAD, ischemic cardiomyopathy, CVA, and valvular disease was referred by Dr. Martinique for CHF evaluation.  Patient has an extensive history of vascular disease.  She had CABG in 2007.  Repeat LHC in 2007 showed all grafts closed except LIMA-LAD.  Patient had DES to LCx/OM.  [place] 2016 showed occlusion of DES to LCx/OM with L-L and L-R collaterals. Poor targets for redo CABG.   In 2/21, echo was done showing EF 20-25% with moderate RV dysfunction, moderate-severe MR, moderate TR.  Despite smoking history, PFTs in 2019 looked relatively normal. She additionally had the placement of a carotid stent on the right in 1/19.    RHC was done in 4/21, showing near-normal filling pressures with CI 2.12 by Fick.  CPX in 5/21 showed peak VO2 13, VE/VCO2 slope 37 => moderate HF limitation.  The PFTs on the CPX were not markedly abnormal. She saw Dr. Gwenlyn Found recently, peripheral arterial dopplers in 4/21 showed significant stenosis right SFA.   Patient returns for followup of CHF and to discuss LVAD with our LVAD nurse coordinator (see note above).  She still has occasional episodes of lightheadedness with standing, but seems to have generally tolerated addition of spironolactone at last appointment.   She is still short of breath and tired after walking about 30 feet, fatigue is very prominent.  She has also used NTG spray 3 times since last appointment, each time when walking from her porch into the house.  The spray resolves the chest pain rapidly.  Stable bilateral calf claudication.  No rest pain or pedal ulcers.  She uses a rollator.  She has quit smoking since Monday.  Weight stable.   Labs (4/21): K 3.7 => 3.4, creatinine 0.78 => 0.7, hgb 11.3 => 14.4 Labs (5/21): digoxin 0.6, K 3.5, creatinine 0.63  REDS clip 33%  PMH: 1. CAD: CABG 2007.  - LHC in 2007 showed all grafts closed except LIMA-LAD.  Patient had DES to LCx/OM.   - [place] 2016 showed occlusion of DES to LCx/OM with L-L and L-R collaterals.  - Not candidate for redo CABG with poor targets.  2. Chronic systolic CHF: Ischemic cardiomyopathy.   - Echo (2/21): EF 20-25%, moderately dilated and moderately dysfunctional RV, PASP 49, severe biatrial enlargement, moderate-severe MR, severe TR, mild-moderate AS, IVC dilated.  - RHC (4/21): mean RA 6, PA 41/16, mean 29, mean PCWP 15, CI 2.12, PVR 4.3 WU - CPX (5/21): peak VO2 13, VE/VCO2 slope 37, RER 1.06 => moderate HF limitation.  3. Fe deficiency anemia: FOBT negative.  4. Carotid stenosis: Right carotid stent in 1/19.  - Carotid dopplers (5/63): 87-56% LICA stenosis.  5. H/o CVA 6. HTN 7. Type 2 diabetes 8. Hyperlipidemia 9. Seizure disorder 10. OSA: Cannot tolerate CPAP.  11. Active smoker: PFTs  in 2019 were relatively normal.  She does use oxygen at night.  - PFTs (5/21): FEV1 82%, FVC 80%, ratio 102% 12. PAD: ABIs (4/21) 0.76 on right, 0.86 on left => significant right SFA stenosis.   Social History   Socioeconomic History  . Marital status: Divorced    Spouse name: Not on file  . Number of children: 0  . Years of education: Not on file  . Highest education level: Not on file  Occupational History  . Occupation: disabled  Tobacco Use  . Smoking status: Current  Every Day Smoker    Packs/day: 1.00    Years: 51.00    Pack years: 51.00    Types: Cigarettes    Start date: 14  . Smokeless tobacco: Never Used  . Tobacco comment: 04/17/19 down to 1 pack her day--LCR  1.5 pack to 2 packs per day. Has been working on reducing the # of cigarettes she smokes, down to 1/2 PP7D  Substance and Sexual Activity  . Alcohol use: No  . Drug use: No  . Sexual activity: Not Currently    Partners: Male    Birth control/protection: None  Other Topics Concern  . Not on file  Social History Narrative  . Not on file   Social Determinants of Health   Financial Resource Strain: Low Risk   . Difficulty of Paying Living Expenses: Not hard at all  Food Insecurity: No Food Insecurity  . Worried About Charity fundraiser in the Last Year: Never true  . Ran Out of Food in the Last Year: Never true  Transportation Needs: No Transportation Needs  . Lack of Transportation (Medical): No  . Lack of Transportation (Non-Medical): No  Physical Activity: Insufficiently Active  . Days of Exercise per Week: 2 days  . Minutes of Exercise per Session: 60 min  Stress: No Stress Concern Present  . Feeling of Stress : Not at all  Social Connections:   . Frequency of Communication with Friends and Family:   . Frequency of Social Gatherings with Friends and Family:   . Attends Religious Services:   . Active Member of Clubs or Organizations:   . Attends Archivist Meetings:   [name] Marital Status:   Intimate Partner Violence: Not At Risk  . Fear of Current or Ex-Partner: No  . Emotionally Abused: No  . Physically Abused: No  . Sexually Abused: No   Family History  Problem Relation Age of Onset  . Heart disease Mother   . Diabetes Mother   . COPD Mother   . Hyperlipidemia Mother   . Hypertension Mother   . Cancer Father        met, origin unknown  . Heart attack Father   . Drug abuse Paternal Grandmother   . Stroke Paternal Grandfather   . Lung cancer Paternal  Aunt        lung with mets to brain  . Melanoma Paternal Uncle   . Lung cancer Paternal Uncle        lung/liver to brain  . Cancer Paternal Uncle        cancer of unknown type  . Heart disease Maternal Grandfather   . Hypertension Sister   . Cancer Sister        eyelid  . Glaucoma Sister   . Heart disease Maternal Aunt        x 2 aunts   ROS: All systems reviewed and negative except as per HPI.   Current  Outpatient Medications  Medication Sig Dispense Refill  . acetaminophen (TYLENOL) 500 MG tablet Take 1,000 mg by mouth every 6 (six) hours as needed for moderate pain or headache.    . albuterol (VENTOLIN HFA) 108 (90 Base) MCG/ACT inhaler INHALER 2 PUFFS EVERY 6 HOURS AS NEEDED FOR WHEEZING/SHORTNESS OF BREATH (Patient taking differently: Inhale 2 puffs into the lungs every 6 (six) hours as needed for wheezing or shortness of breath. ) 18 g 0  . aspirin EC 81 MG tablet Take 81 mg by mouth every evening.  30 tablet 6  . clopidogrel (PLAVIX) 75 MG tablet Take 1 tablet (75 mg total) by mouth daily. 90 tablet 3  . dexlansoprazole (DEXILANT) 60 MG capsule Take 60 mg by mouth daily.    . digoxin (LANOXIN) 0.125 MG tablet TAKE 0.5 TABLETS (0.0625 MG TOTAL) BY MOUTH DAILY. 45 tablet 3  . fluticasone (FLONASE) 50 MCG/ACT nasal spray Place 2 sprays into both nostrils daily. 48 g 1  . gabapentin (NEURONTIN) 300 MG capsule TAKE 2 CAPSULES BY MOUTH AT BEDTIME (Patient taking differently: Take 600 mg by mouth at bedtime. ) 180 capsule 1  . glimepiride (AMARYL) 2 MG tablet Take 2 mg by mouth in the morning.     [name] glimepiride (AMARYL) 4 MG tablet Take 4 mg by mouth every evening.     [name] guaiFENesin (MUCINEX) 600 MG 12 hr tablet Take 2 tablets (1,200 mg total) by mouth 2 (two) times daily. 360 tablet 3  . iron polysaccharides (NU-IRON) 150 MG capsule Take 1 capsule (150 mg total) by mouth 2 (two) times daily. 180 capsule 3  . isosorbide mononitrate (IMDUR) 30 MG 24 hr tablet Take 2 tablets (60 mg  total) by mouth daily. 90 tablet 3  . levETIRAcetam (KEPPRA) 1000 MG tablet Take 1,000 mg by mouth 2 (two) times daily.    . metFORMIN (GLUCOPHAGE-XR) 500 MG 24 hr tablet Take 500 mg by mouth every evening.   6  . metolazone (ZAROXOLYN) 2.5 MG tablet Take 1 tablet (2.5 mg total) by mouth once a week. On mondays 10 tablet 4  . nicotine (NICODERM CQ - DOSED IN MG/24 HOURS) 21 mg/24hr patch Place 21 mg onto the skin daily.    . nicotine polacrilex (NICORETTE) 4 MG gum Take 1 each (4 mg total) by mouth as needed for smoking cessation. 90 tablet 2  . nitroGLYCERIN (NITROLINGUAL) 0.4 MG/SPRAY spray Place 1 spray under the tongue every 5 (five) minutes x 3 doses as needed for chest pain. 12 g 3  . nystatin (NYSTATIN) powder Apply 1 application topically 4 (four) times daily as needed (yeast infections). 45 g 1  . ONE TOUCH ULTRA TEST test strip USE AS DIRECTED FOR TESTING BLOOD GLUCOSE 3 TIMES DAILY  1  . polyethylene glycol powder (GLYCOLAX/MIRALAX) powder TAKE 1 DOSE 17 GRAMS IN 8 OZ OF WATER DAILY AS NEEDED FOR CONSTIPATION. (Patient taking differently: Take 17 g by mouth daily. ) 527 g 3  . potassium chloride SA (KLOR-CON) 20 MEQ tablet Take 2 tablets (40 mEq total) by mouth 2 (two) times daily. (Patient taking differently: Take 20 mEq by mouth 2 (two) times daily. ) 60 tablet 6  . spironolactone (ALDACTONE) 25 MG tablet Take 0.5 tablets (12.5 mg total) by mouth at bedtime. 45 tablet 3  . dapagliflozin propanediol (FARXIGA) 10 MG TABS tablet Take 1 tablet (10 mg total) by mouth daily before breakfast. 30 tablet 3  . diphenhydrAMINE (BENADRYL) 25 mg capsule Take 25 mg  by mouth 2 (two) times daily as needed for itching or allergies.    . promethazine (PHENERGAN) 25 MG tablet Take 1 tablet (25 mg total) by mouth every 8 (eight) hours as needed for nausea or vomiting. Caution of sedation (Patient not taking: Reported on 02/24/2020) 30 tablet 1  . ranolazine (RANEXA) 1000 MG SR tablet Take 1 tablet (1,000 mg  total) by mouth 2 (two) times daily. 180 tablet 3  . rosuvastatin (CRESTOR) 10 MG tablet Take 10 mg by mouth daily.    . sodium chloride (OCEAN) 0.65 % SOLN nasal spray Place 1 spray into both nostrils as needed for congestion.    . Tiotropium Bromide-Olodaterol (STIOLTO RESPIMAT) 2.5-2.5 MCG/ACT AERS Inhale 2 puffs into the lungs daily. 8 g 0  . torsemide (DEMADEX) 20 MG tablet Take 3 tablets (60 mg total) by mouth every morning AND 2 tablets (40 mg total) every evening. 150 tablet 4  . TRESIBA FLEXTOUCH 100 UNIT/ML FlexTouch Pen Inject 8 Units into the skin daily.      No current facility-administered medications for this encounter.   BP 116/68   Pulse 85   Ht _0  (1.473 m)   Wt 60 kg (132 lb 3.2 oz)   LMP 09/02/2003   SpO2 96%   BMI 27.63 kg/m  General: NAD Neck: No JVD, no thyromegaly or thyroid nodule.  Lungs: Clear to auscultation bilaterally with normal respiratory effort. CV: Nondisplaced PMI.  Heart regular S1/S2, no S3/S4, 2/6 early SEM RUSB.  No peripheral edema.  No carotid bruit.  Unable to palpate pedal pulses.  Abdomen: Soft, nontender, no hepatosplenomegaly, no distention.  Skin: Intact without lesions or rashes.  Neurologic: Alert and oriented x 3.  Psych: Normal affect. Extremities: No clubbing or cyanosis.  HEENT: Normal.   Assessment/Plan:  1. Chronic systolic CHF: Ischemic cardiomyopathy.  Echo in 2/21 showed EF 20-25%, moderately dilated and moderately dysfunctional RV, PASP 49, severe biatrial enlargement, moderate-severe MR, severe TR, mild-moderate AS, IVC dilated. CPX (5/21) with moderate HF limitation, RHC (4/21) with CI low at 2.12.  Medication titration has been severely limited by hypotension and orthostatic symptoms.  NYHA class IV symptoms, very limited chronically.  She does not appear significantly volume overloaded today by exam and REDS clip is not elevated (33%).  - Continue digoxin 0.0625 mg daily, check level today.   - Continue torsemide 60  qam/40 qpm.  She will take metolazone 2.5 mg once a week on Mondays. BMET today.  - She is tolerating spironolactone 12.5 mg qhs.  - She has not tolerated BP-active meds well, avoid Coreg and Entresto for now.   - I will add dapagliflozin 10 mg daily with BMET in 10 days.  - She may be a vericiguat candidate in the future.  - NYHA class IV symptoms with marginal cardiac index (2.1).  Moderate HF limitation on CPX.  Medication titration will be limited by orthostatic symptoms.  We will need to think about her for LVAD in the future (not transplant candidate with ongoing smoking).  This is complicated by her small size and her prior sternotomy, as well as PAD.  However, her PFTs on CPX were relatively unremarkable. She had a discussion today with my LVAD nurse coordinator about the LVAD process.   2. CAD: s/p CABG 0051 complicated by early graft closure.  Repeat cath in 2007 showed only LIMA-LAD still open and she had DES to LCX/OM.  Cath in 2016 showed this stent was occluded.  She has not  had targets for redo CABG and she does not have good interventional targets.  She had 3 episodes of exertional chest pain since last appointment, resolving with NTG.  - Continue ASA 81 and Crestor 10 mg daily. Check lipids today.  - Continue ranolazone 1000 bid.  - Increase Imdur to 60 mg daily.  3. Smoking: She failed Chantix and Wellbutrin in the past but has actually quit since Monday.  - I encouraged her to stay off cigarettes.    4. PAD: Significant right SFA stenosis with claudication, no rest symptoms or ulcers.  She was seen by Dr. Alvester Chou who recommended dealing with her heart issues first then coming back to look at the right SFA. However, unless we decide to push forward soon with LVAD, it may be best to go ahead and intervene on her right SFA to allow increased activity. Will discuss with LVAD team.   5. Carotid stenosis: Due for repeat dopplers in 7/21 at 1 year.  6. Fe deficiency anemia: Has been FOBT  negative.  Has had Ferahema.  Most recent hgb looks stable.   Followup in 2-3 weeks.   Loralie Champagne 02/24/2020

## 2020-02-24 NOTE — Patient Instructions (Addendum)
1. Increase Imdur to 60 mg (2 tablets daily) 2. Start Farxiga 10 mg daily 3. We will call you with your procedure schedule.

## 2020-02-24 NOTE — Progress Notes (Signed)
CSW met briefly with patient in the clinic for a meet n greet for VAD work up. Patient shared she lives alone but has multiple family members next door and across the street. She laughed and stated they have a family compound. Patient spoke briefly about her understanding of her current medical illness and open to discuss further with CSW at a later time. CSW will follow up with VAD Coordinator for appropriate time to complete VAD assessment. Raquel Sarna, [place], [place]

## 2020-02-24 NOTE — Progress Notes (Signed)
@Patient  ID: Diane [name], female    DOB: 1956/08/29, 64 y.o.   MRN: [id number]  Chief Complaint  Patient presents with  . Follow-up    copd, doe, stopped smoking    Referring provider: Tower, [name], MD  HPI:  64 year old female current smoker followed in our office for bronchitis  PMH: Combined CHF, CAD s/p CABG, CVA, DM, HLD, HTN, Fibromyalgia, GERD, Goiter, Pneumonia, Seizures Smoker/ Smoking History: Former smoker.  Quit 2021.  70-pack-year history Maintenance: none  Pt of: Dr. Halford Chessman  02/25/2020  - Visit   64 year old female former smoker followed in our office for COPD.  Patient reports has been doing well.  She has successfully stopped smoking as of 3 days ago.  She received both of her Covid vaccines.  She is established with the heart failure team and she is preparing to potentially get placed on an LVAD.  She has an appointment on 03/02/2020 with Dr. Darcey Nora to further discuss this.  Patient was previously trialed on Stiolto Respimat.  She is unsure if she saw much clinical benefit from this.  Questionaires / Pulmonary Flowsheets:   MMRC: mMRC Dyspnea Scale mMRC Score  02/25/2020 3  12/25/2019 2   Tests:  PSG 04/10/06>> RDI 105  CPAP 06/13/11 to 07/10/11>>Used on 20 of 28 nights with median 3 hrs 45 min.  Average AHI 5 with median CPAP 7 cm H2O and 95th percentile CPAP 10 cm H2O. PSG 05/15/16 >> AHI 1.9, SpO2 low 83%  Pulmonary tests PFT 05/12/12>>FEV1 1.83 (98%), FEV1% 80, TLC 3.83 (98%), DLCO 61% (corrects for lung volumes), no BD response PFT 06/30/14 >> FEV1 1.80 (93%), FEV1% 86, TLC 4.35 (108%), DLCO 82%, no BD Spirometry 05/11/16 >> FEV1 1.74 (89%), FEV1% 76  ONO with RA 07/27/16 >> test time 6 hrs 25 min.  Mean SpO2 91.04%, low SpO2 68%.  Spent 18 min with SpO2 < 88%.  07/15/2018-pulmonary function test- FVC 2.15 (94% predicted), postbronchodilator ratio 87, postbronchodilator FEV1 1.91 (109% predicted), no significant bronchodilator response, slight mid flow  reversibility after albuterol, DLCO 81 >>>Normal spirometry, No significant bronchodilator response, mild air trapping on lung volume testing   Cardiac tests Echo 04/25/15 >> EF 96%, grade 2 diastolic CHF, mod MR, PAS 45 mmHg    FENO:  No results found for: NITRICOXIDE  PFT: PFT Results Latest Ref Rng & Units 07/15/2018 06/30/2014  FVC-Pre L 2.15 2.14  FVC-Predicted Pre % 94 85  FVC-Post L 2.19 2.09  FVC-Predicted Post % 95 83  Pre FEV1/FVC % % 85 84  Post FEV1/FCV % % 87 86  FEV1-Pre L 1.83 1.80  FEV1-Predicted Pre % 105 93  FEV1-Post L 1.91 1.80  DLCO UNC% % 81 82  DLCO COR %Predicted % 82 83  TLC L 4.34 4.35  TLC % Predicted % 111 108  RV % Predicted % 129 133    WALK:  SIX MIN WALK 09/04/2016  Supplimental Oxygen during Test? (L/min) No  Tech Comments: pt walked a normal pace-- requested to carry her purse as she felt that this would best relate to her "normal" exertional acitivity. No desat, no c/o SOB --amg    Imaging: CARDIAC CATHETERIZATION  Result Date: 01/27/2020 1.  Filling pressures near normal. 2.  Mild pulmonary arterial HTN (suspect group 3 from COPD). 3.  Low, but not markedly low cardiac index at 2.1.  - Increase torsemide to 60 qam/40 qpm, will follow for metolazone need (had dose yesterday).  - Continue workup  with CPX (scheduled), then will regroup with her in the office.    Lab Results:  CBC    Component Value Date/Time   WBC 6.1 02/24/2020 1228   RBC 5.77 (H) 02/24/2020 1228   HGB 16.0 (H) 02/24/2020 1228   HGB 11.3 (L) 12/31/2019 1329   HCT 49.1 (H) 02/24/2020 1228   PLT 227 02/24/2020 1228   PLT 201 12/31/2019 1329   MCV 85.1 02/24/2020 1228   MCH 27.7 02/24/2020 1228   MCHC 32.6 02/24/2020 1228   RDW 17.2 (H) 02/24/2020 1228   LYMPHSABS 0.8 12/31/2019 1329   MONOABS 0.5 12/31/2019 1329   EOSABS 0.1 12/31/2019 1329   BASOSABS 0.0 12/31/2019 1329    BMET    Component Value Date/Time   NA 133 (L) 02/24/2020 1228   NA 135 (L)  02/15/2017 1457   K 4.0 02/24/2020 1228   K 4.8 02/15/2017 1457   CL 92 (L) 02/24/2020 1228   CO2 28 02/24/2020 1228   CO2 26 02/15/2017 1457   GLUCOSE 267 (H) 02/24/2020 1228   GLUCOSE 98 02/15/2017 1457   GLUCOSE 251 (H) 10/10/2006 1254   BUN 27 (H) 02/24/2020 1228   BUN 10.8 02/15/2017 1457   CREATININE 0.74 02/24/2020 1228   CREATININE 0.78 12/31/2019 1329   CREATININE 0.69 05/06/2017 1405   CREATININE 0.7 02/15/2017 1457   CALCIUM 9.5 02/24/2020 1228   CALCIUM 9.3 02/15/2017 1457   GFRNONAA >60 02/24/2020 1228   GFRNONAA >60 12/31/2019 1329   GFRNONAA 88 03/22/2015 1213   GFRAA >60 02/24/2020 1228   GFRAA >60 12/31/2019 1329   GFRAA >89 03/22/2015 1213    BNP No results found for: BNP  ProBNP No results found for: PROBNP  Specialty Problems      Pulmonary Problems   Obstructive sleep apnea    PSG 04/10/06>> RDI 105  CPAP 06/13/11 to 07/10/11>>Used on 20 of 28 nights with median 3 hrs 45 min.  Average AHI 5 with median CPAP 7 cm H2O and 95th percentile CPAP 10 cm H2O. PSG 05/15/16 >> AHI 1.9, SpO2 low 83%       Allergic rhinitis   Sleep-related hypoventilation due to lower airway obstruction   Chronic respiratory failure ([place])    ONO with RA 07/27/16 >> test time 6 hrs 25 min.  Mean SpO2 91.04%, low SpO2 68%.  Spent 18 min with SpO2 < 88%.      Smokers' cough (HCC)   Shortness of breath      Allergies  Allergen Reactions  . Bee Venom Itching and Swelling  . Benadryl [Diphenhydramine] Other (See Comments)    Insomnia   . Entresto [Sacubitril-Valsartan] Cough  . Tetracycline     Unknown, pt cannot recall exact reaction  . Ace Inhibitors Cough    Immunization History  Administered Date(s) Administered  . Fluad Quad(high Dose 65+) 06/23/2019  . Influenza Whole 06/01/2012  . Influenza,inj,Quad PF,6+ Mos 06/16/2013, 06/16/2014, 06/21/2015, 08/13/2016, 10/11/2017, 07/15/2018  . Pneumococcal Conjugate-13 05/11/2016  . Pneumococcal Polysaccharide-23  11/18/1998, 11/04/2008, 06/16/2014, 06/23/2019  . Td 10/01/1996, 11/15/2009    Past Medical History:  Diagnosis Date  . Anemia   . Carotid stenosis    a. s/p right carotid stent 10/2017.  [name] Chronic systolic CHF (congestive heart failure) ([place])   . Colon polyps   . COPD (chronic obstructive pulmonary disease) ([place])   . Coronary artery disease    post CABG in 3/07 , coronary stents   . Diabetes mellitus    type  2  . Dyslipidemia   . Fibromyalgia   . GERD (gastroesophageal reflux disease)   . Headache    hx of   . Hyperlipidemia   . Hypertension   . Myocardial infarction ([place])   . Pneumonia    hx of   . Seizures ([place])    last seizure- 03/2013   . Shortness of breath dyspnea    with exertion or when fluid builds up   . Sleep apnea    used to wear a cpap- not used in 3 years   . Status post dilation of esophageal narrowing   . Stroke [facility]) 1993   problems with balance   . Systolic murmur    known mild AS and MR  . Thyroid goiter   . Tobacco abuse     Tobacco History: Social History   Tobacco Use  Smoking Status Former Smoker  . Packs/day: 1.00  . Years: 51.00  . Pack years: 51.00  . Types: Cigarettes  . Start date: 85  . Quit date: 02/22/2020  Smokeless Tobacco Never Used   Counseling given: Yes   Continue to not smoke  Outpatient Encounter Medications as of 02/25/2020  Medication Sig  . acetaminophen (TYLENOL) 500 MG tablet Take 1,000 mg by mouth every 6 (six) hours as needed for moderate pain or headache.  . albuterol (VENTOLIN HFA) 108 (90 Base) MCG/ACT inhaler INHALER 2 PUFFS EVERY 6 HOURS AS NEEDED FOR WHEEZING/SHORTNESS OF BREATH (Patient taking differently: Inhale 2 puffs into the lungs every 6 (six) hours as needed for wheezing or shortness of breath. )  . aspirin EC 81 MG tablet Take 81 mg by mouth every evening.   . clopidogrel (PLAVIX) 75 MG tablet Take 1 tablet (75 mg total) by mouth daily.  . dapagliflozin propanediol (FARXIGA) 10 MG TABS tablet  Take 1 tablet (10 mg total) by mouth daily before breakfast.  . dexlansoprazole (DEXILANT) 60 MG capsule Take 60 mg by mouth daily.  . digoxin (LANOXIN) 0.125 MG tablet TAKE 0.5 TABLETS (0.0625 MG TOTAL) BY MOUTH DAILY.  . diphenhydrAMINE (BENADRYL) 25 mg capsule Take 25 mg by mouth 2 (two) times daily as needed for itching or allergies.  . fluticasone (FLONASE) 50 MCG/ACT nasal spray Place 2 sprays into both nostrils daily.  [name] gabapentin (NEURONTIN) 300 MG capsule TAKE 2 CAPSULES BY MOUTH AT BEDTIME (Patient taking differently: Take 600 mg by mouth at bedtime. )  . glimepiride (AMARYL) 2 MG tablet Take 2 mg by mouth in the morning.   [name] glimepiride (AMARYL) 4 MG tablet Take 4 mg by mouth every evening.   [name] guaiFENesin (MUCINEX) 600 MG 12 hr tablet Take 2 tablets (1,200 mg total) by mouth 2 (two) times daily.  . iron polysaccharides (NU-IRON) 150 MG capsule Take 1 capsule (150 mg total) by mouth 2 (two) times daily.  . isosorbide mononitrate (IMDUR) 30 MG 24 hr tablet Take 2 tablets (60 mg total) by mouth daily.  [name] levETIRAcetam (KEPPRA) 1000 MG tablet Take 1,000 mg by mouth 2 (two) times daily.  . metFORMIN (GLUCOPHAGE-XR) 500 MG 24 hr tablet Take 500 mg by mouth every evening.   . metolazone (ZAROXOLYN) 2.5 MG tablet Take 1 tablet (2.5 mg total) by mouth once a week. On mondays  . nicotine (NICODERM CQ - DOSED IN MG/24 HOURS) 21 mg/24hr patch Place 21 mg onto the skin daily.  . nicotine polacrilex (NICORETTE) 4 MG gum Take 1 each (4 mg total) by mouth as needed for smoking cessation.  [name]  nitroGLYCERIN (NITROLINGUAL) 0.4 MG/SPRAY spray Place 1 spray under the tongue every 5 (five) minutes x 3 doses as needed for chest pain.  [name] nystatin (NYSTATIN) powder Apply 1 application topically 4 (four) times daily as needed (yeast infections).  . ONE TOUCH ULTRA TEST test strip USE AS DIRECTED FOR TESTING BLOOD GLUCOSE 3 TIMES DAILY  . polyethylene glycol powder (GLYCOLAX/MIRALAX) powder TAKE 1 DOSE 17 GRAMS  IN 8 OZ OF WATER DAILY AS NEEDED FOR CONSTIPATION. (Patient taking differently: Take 17 g by mouth daily. )  . potassium chloride SA (KLOR-CON) 20 MEQ tablet Take 2 tablets (40 mEq total) by mouth 2 (two) times daily. (Patient taking differently: Take 20 mEq by mouth 2 (two) times daily. )  . promethazine (PHENERGAN) 25 MG tablet Take 1 tablet (25 mg total) by mouth every 8 (eight) hours as needed for nausea or vomiting. Caution of sedation  . ranolazine (RANEXA) 1000 MG SR tablet Take 1 tablet (1,000 mg total) by mouth 2 (two) times daily.  . rosuvastatin (CRESTOR) 10 MG tablet Take 10 mg by mouth daily.  . sodium chloride (OCEAN) 0.65 % SOLN nasal spray Place 1 spray into both nostrils as needed for congestion.  [name] spironolactone (ALDACTONE) 25 MG tablet Take 0.5 tablets (12.5 mg total) by mouth at bedtime.  . Tiotropium Bromide-Olodaterol (STIOLTO RESPIMAT) 2.5-2.5 MCG/ACT AERS Inhale 2 puffs into the lungs daily.  [name] torsemide (DEMADEX) 20 MG tablet Take 3 tablets (60 mg total) by mouth every morning AND 2 tablets (40 mg total) every evening.  Tyler Aas FLEXTOUCH 100 UNIT/ML FlexTouch Pen Inject 12 Units into the skin daily.   . Tiotropium Bromide-Olodaterol (STIOLTO RESPIMAT) 2.5-2.5 MCG/ACT AERS Inhale 2 puffs into the lungs daily for 28 days.   No facility-administered encounter medications on file as of 02/25/2020.     Review of Systems  Review of Systems  Constitutional: Positive for fatigue. Negative for activity change and fever.  HENT: Negative for sinus pressure, sinus pain and sore throat.   Respiratory: Positive for shortness of breath. Negative for cough and wheezing.   Cardiovascular: Negative for chest pain and palpitations.  Gastrointestinal: Negative for diarrhea, nausea and vomiting.  Musculoskeletal: Negative for arthralgias.  Neurological: Negative for dizziness.  Psychiatric/Behavioral: Negative for sleep disturbance. The patient is not nervous/anxious.      Physical  Exam  BP 118/86 (BP Location: Left Arm, Cuff Size: Normal)   Pulse 97   Ht 4\' 10"  (1.473 m)   Wt 131 lb (59.4 kg)   LMP 09/02/2003   SpO2 96%   BMI 27.38 kg/m   Wt Readings from Last 5 Encounters:  02/25/20 131 lb (59.4 kg)  02/24/20 132 lb 3.2 oz (60 kg)  02/12/20 131 lb 12.8 oz (59.8 kg)  02/10/20 132 lb (59.9 kg)  01/27/20 132 lb 3.2 oz (60 kg)    BMI Readings from Last 5 Encounters:  02/25/20 27.38 kg/m  02/24/20 27.63 kg/m  02/12/20 27.55 kg/m  02/10/20 27.59 kg/m  01/27/20 27.63 kg/m     Physical Exam Vitals and nursing note reviewed.  Constitutional:      General: She is not in acute distress.    Appearance: Normal appearance. She is normal weight.  HENT:     Head: Normocephalic and atraumatic.     Right Ear: Tympanic membrane, ear canal and external ear normal. There is no impacted cerumen.     Left Ear: Tympanic membrane, ear canal and external ear normal. There is no impacted cerumen.  Nose: Nose normal. No congestion.     Mouth/Throat:     Mouth: Mucous membranes are moist.     Pharynx: Oropharynx is clear.  Eyes:     Pupils: Pupils are equal, round, and reactive to light.  Cardiovascular:     Rate and Rhythm: Normal rate and regular rhythm.     Pulses: Normal pulses.     Heart sounds: Normal heart sounds. No murmur.  Pulmonary:     Effort: Pulmonary effort is normal. No respiratory distress.     Breath sounds: No decreased air movement. No decreased breath sounds, wheezing or rales.  Musculoskeletal:     Cervical back: Normal range of motion.  Skin:    General: Skin is warm and dry.     Capillary Refill: Capillary refill takes less than 2 seconds.  Neurological:     General: No focal deficit present.     Mental Status: She is alert and oriented to person, place, and time. Mental status is at baseline.     Gait: Gait abnormal (uses walker ).  Psychiatric:        Mood and Affect: Mood normal.        Behavior: Behavior normal.         Thought Content: Thought content normal.        Judgment: Judgment normal.       Assessment & Plan:   Chronic systolic congestive heart failure ([place]) Plan: Keep follow-up with CHF clinic Keep follow-up with CVTS for evaluation of LVAD Continue to not smoke Continue medications as managed by CHF  Chronic respiratory failure ([place]) Plan: We will clinically monitor  TOBACCO USE Patient successfully stopped smoking 1 week ago Patient preparing for LVAD  Plan: Continue not smoke    Return in about 2 months (around 04/26/2020), or if symptoms worsen or fail to improve, for Follow up with Dr. Halford Chessman.   Lauraine Rinne, NP 02/25/2020   This appointment required 25 minutes of patient care (this includes precharting, chart review, review of results, face-to-face care, etc.).

## 2020-02-24 NOTE — Progress Notes (Signed)
ReDS Vest / Clip - 02/24/20 1430      ReDS Vest / Clip   Station Marker  C    Ruler Value  30    ReDS Value Range  Low volume    ReDS Actual Value  33    Anatomical Comments  sitting

## 2020-02-25 ENCOUNTER — Other Ambulatory Visit (HOSPITAL_COMMUNITY): Payer: Self-pay | Admitting: Unknown Physician Specialty

## 2020-02-25 ENCOUNTER — Telehealth: Payer: Self-pay | Admitting: Family Medicine

## 2020-02-25 ENCOUNTER — Telehealth: Payer: Self-pay | Admitting: Pulmonary Disease

## 2020-02-25 ENCOUNTER — Encounter: Payer: Self-pay | Admitting: Pulmonary Disease

## 2020-02-25 ENCOUNTER — Telehealth (HOSPITAL_COMMUNITY): Payer: Self-pay

## 2020-02-25 ENCOUNTER — Ambulatory Visit (INDEPENDENT_AMBULATORY_CARE_PROVIDER_SITE_OTHER): Payer: Medicare Other | Admitting: Pulmonary Disease

## 2020-02-25 ENCOUNTER — Encounter (HOSPITAL_COMMUNITY): Payer: Self-pay | Admitting: Unknown Physician Specialty

## 2020-02-25 DIAGNOSIS — J9611 Chronic respiratory failure with hypoxia: Secondary | ICD-10-CM | POA: Diagnosis not present

## 2020-02-25 DIAGNOSIS — E7800 Pure hypercholesterolemia, unspecified: Secondary | ICD-10-CM

## 2020-02-25 DIAGNOSIS — I5022 Chronic systolic (congestive) heart failure: Secondary | ICD-10-CM | POA: Diagnosis not present

## 2020-02-25 DIAGNOSIS — F17200 Nicotine dependence, unspecified, uncomplicated: Secondary | ICD-10-CM | POA: Diagnosis not present

## 2020-02-25 LAB — T4: T4, Total: 6.9 ug/dL (ref 4.5–12.0)

## 2020-02-25 MED ORDER — STIOLTO RESPIMAT 2.5-2.5 MCG/ACT IN AERS
2.0000 | INHALATION_SPRAY | Freq: Every day | RESPIRATORY_TRACT | 0 refills | Status: DC
Start: 2020-02-25 — End: 2020-03-24

## 2020-02-25 MED ORDER — ROSUVASTATIN CALCIUM 20 MG PO TABS: 20.0000 mg | ORAL_TABLET | Freq: Every day | ORAL | 3 refills | Status: DC

## 2020-02-25 NOTE — Patient Instructions (Signed)
You were seen today by Lauraine Rinne, NP  for:   1. Chronic systolic congestive heart failure ([place]) 2. Chronic respiratory failure with hypoxia (HCC) 3. TOBACCO USE  We recommend today:   Meds ordered this encounter  Medications  . Tiotropium Bromide-Olodaterol (STIOLTO RESPIMAT) 2.5-2.5 MCG/ACT AERS    Sig: Inhale 2 puffs into the lungs daily for 28 days.    Dispense:  8 g    Refill:  0    Order Specific Question:   Lot Number?    Answer:   [id number]    Order Specific Question:   Expiration Date?    Answer:   08/01/2020    Order Specific Question:   Quantity    Answer:   2    Follow Up:    No follow-ups on file.   Please do your part to reduce the spread of COVID-19:      Reduce your risk of any infection  and COVID19 by using the similar precautions used for avoiding the common cold or flu:  [name] Wash your hands often with soap and warm water for at least 20 seconds.  If soap and water are not readily available, use an alcohol-based hand sanitizer with at least 60% alcohol.  . If coughing or sneezing, cover your mouth and nose by coughing or sneezing into the elbow areas of your shirt or coat, into a tissue or into your sleeve (not your hands). Langley Gauss A MASK when in public  . Avoid shaking hands with others and consider head nods or verbal greetings only. . Avoid touching your eyes, nose, or mouth with unwashed hands.  . Avoid close contact with people who are sick. . Avoid places or events with large numbers of people in one location, like concerts or sporting events. . If you have some symptoms but not all symptoms, continue to monitor at home and seek medical attention if your symptoms worsen. . If you are having a medical emergency, call 911.   [place] / e-Visit: [email or web address]         [facility]: [facility]: [phone]                     [name]: [phone]     It is flu season:   >>> Best ways to protect herself from the flu: Receive the yearly flu vaccine, practice good hand hygiene washing with soap and also using hand sanitizer when available, eat a nutritious meals, get adequate rest, hydrate appropriately   Please contact the office if your symptoms worsen or you have concerns that you are not improving.   Thank you for choosing [facility] for your healthcare, and for allowing [place] to partner with you on your healthcare journey. I am thankful to be able to provide care to you today.   Wyn Quaker FNP-C

## 2020-02-25 NOTE — Telephone Encounter (Signed)
02/25/2020  Pharmacy team,  Can we investigate and see which LAMA/LABA combination inhaler would be the most affordable for the patient?  She is currently trialing Stiolto Respimat.  She is unsure if there is much clinical benefit to this.  She is also concerned on remaining on an inhaler if it is very expensive.  Wyn Quaker, FNP

## 2020-02-25 NOTE — Telephone Encounter (Signed)
Patient advised and verbalized understanding. Lab order placed,lab appt scheduled,med list updated to reflect changes

## 2020-02-25 NOTE — Assessment & Plan Note (Signed)
Patient successfully stopped smoking 1 week ago Patient preparing for LVAD  Plan: Continue not smoke

## 2020-02-25 NOTE — Assessment & Plan Note (Signed)
Plan: We will clinically monitor

## 2020-02-25 NOTE — Progress Notes (Signed)
Reviewed and agree with assessment/plan.   [name], MD [facility] Pulmonary/Critical Care 02/25/2020, 4:35 PM Pager:  [phone]

## 2020-02-25 NOTE — Progress Notes (Signed)
  Chronic Care Management   Outreach Note  02/25/2020 Name: SHAWNEE GAMBONE MRN: [id number] DOB: 1956-06-01  Referred by: Tower, [name], MD Reason for referral : No chief complaint on file.   An unsuccessful telephone outreach was attempted today. The patient was referred to the pharmacist for assistance with care management and care coordination.   This note is not being shared with the patient for the following reason: To respect privacy (The patient or proxy has requested that the information not be shared).  Follow Up Plan:   Earney [place] Upstream Scheduler

## 2020-02-25 NOTE — Assessment & Plan Note (Signed)
Plan: Keep follow-up with CHF clinic Keep follow-up with CVTS for evaluation of LVAD Continue to not smoke Continue medications as managed by CHF

## 2020-02-25 NOTE — Telephone Encounter (Signed)
-----   Message from Larey Dresser, MD sent at 02/24/2020  9:05 PM EDT ----- LDL too high, increase Crestor to 20 mg daily with lipids/LFTs in 2 months.

## 2020-02-26 LAB — ANTITHROMBIN PANEL
AT III AG PPP IMM-ACNC: 117 % (ref 72–124)
Antithrombin Activity: 122 % (ref 75–135)

## 2020-02-26 LAB — LUPUS ANTICOAGULANT PANEL
DRVVT: 36.3 s (ref 0.0–47.0)
PTT Lupus Anticoagulant: 31.8 s (ref 0.0–51.9)

## 2020-02-26 MED ORDER — BEVESPI AEROSPHERE 9-4.8 MCG/ACT IN AERO: 2.0000 | INHALATION_SPRAY | Freq: Two times a day (BID) | RESPIRATORY_TRACT | 1 refills | Status: DC

## 2020-02-26 NOTE — Telephone Encounter (Signed)
C,  Can we please contact the patient and explained to her that the Stiolto Respimat is nonformulary.  We can offer prescriptions to Bevespi or Anoro Ellipta.  Both of these will be $4 for a 1 month supply.  Whichever prescription patient would like sent in I am more than happy to send in on her behalf.  She just needs to let us know.  Wyn Quaker, FNP

## 2020-02-26 NOTE — Telephone Encounter (Signed)
Spoke with patient. She decided to try Bevespi. She is aware that she is to use this inhaler 2 puffs twice daily and to rinse her mouth afterwards. She wishes to have this to be sent to [facility]. Advised her that I would go ahead and call this in today for her.   Nothing further needed at time of call.

## 2020-02-26 NOTE — Telephone Encounter (Signed)
Ran test claim for Stiolto- it is non-formulary on the patient's plan. Plan prefers Anoro or Bevespi- both have $4.00 copay for 1 month supply.  Thanks! Beatriz Chancellor, CPhT

## 2020-03-01 ENCOUNTER — Telehealth (HOSPITAL_COMMUNITY): Payer: Self-pay | Admitting: Unknown Physician Specialty

## 2020-03-01 LAB — FACTOR 5 LEIDEN

## 2020-03-01 MED ORDER — ISOSORBIDE MONONITRATE ER 30 MG PO TB24: 30.0000 mg | ORAL_TABLET | Freq: Every day | ORAL | 3 refills | Status: DC

## 2020-03-01 NOTE — Telephone Encounter (Signed)
Received call from pts [place] stating that pt is dizzy, "head feels heavy", she reports that the pt is "groggy". BP 90/52. Nurse/pt instructed to decrease Imdur to 30 mg daily per Dr. Aundra Dubin.  Pt/nurse verbalized understanding of instructions given. [place] nurse states that she will see the pt again later this week to recheck her BP.  Tanda Rockers RN, BSN VAD Coordinator 24/7 Pager [phone]

## 2020-03-02 ENCOUNTER — Institutional Professional Consult (permissible substitution): Payer: Medicare Other | Admitting: Cardiothoracic Surgery

## 2020-03-02 VITALS — Ht <= 58 in

## 2020-03-03 ENCOUNTER — Institutional Professional Consult (permissible substitution) (INDEPENDENT_AMBULATORY_CARE_PROVIDER_SITE_OTHER): Payer: Medicare Other | Admitting: Cardiothoracic Surgery

## 2020-03-03 ENCOUNTER — Other Ambulatory Visit: Payer: Self-pay

## 2020-03-03 ENCOUNTER — Encounter: Payer: Self-pay | Admitting: Cardiothoracic Surgery

## 2020-03-03 VITALS — BP 126/75 | HR 102 | Temp 97.0°F | Resp 18 | Ht <= 58 in | Wt 128.0 lb

## 2020-03-03 DIAGNOSIS — I255 Ischemic cardiomyopathy: Secondary | ICD-10-CM | POA: Diagnosis not present

## 2020-03-03 DIAGNOSIS — I5022 Chronic systolic (congestive) heart failure: Secondary | ICD-10-CM

## 2020-03-03 DIAGNOSIS — I7090 Unspecified atherosclerosis: Secondary | ICD-10-CM

## 2020-03-03 NOTE — Progress Notes (Signed)
PCP is Tower, [name], MD Referring Provider is Bensimhon, Shaune Pascal, MD  Chief Complaint  Patient presents with  . Congestive Heart Failure    new patient consultation for LVAD placement    HPI: Patient examined, images of most recent echocardiogram and chest CT scan as well as recent right heart cath data personally reviewed and discussed with patient.  Patient with ischemic cardiomyopathy status post multivessel CABG in 2007.  She currently has a patent left IMA to LAD and the vein grafts have closed.  She has stents in the native vessels and she is on Plavix.  Her ejection fraction is 20-25% and she has class III symptoms of heart failure.  She denies any recent admissions for heart failure.  She has significant smoking history and significant pulmonary disease.  She still actively smokes.  Her last low radiation CT scan showed no evidence of lung cancer.  Her last PFTs were over 2 years ago.  She is on home oxygen at night.  The patient has significant iron deficiency anemia of unknown etiology and has received IV iron infusions on several occasions.  She is followed by Dr. Earlie Server for her anemia  The patient states she recovered from her CABG in 2007 without difficulty.  4 years ago she had a laparotomy and resection of a abdominal mass and partial colectomy which turned out to be benign disease.  The patient lives alone close to her sister.  She is retired from working as a Technical brewer at [employer].  The patient has had a stroke in the past from thrombus in the right carotid artery.  Past Medical History:  Diagnosis Date  . Anemia   . Carotid stenosis    a. s/p right carotid stent 10/2017.  [name] Chronic systolic CHF (congestive heart failure) ([place])   . Colon polyps   . COPD (chronic obstructive pulmonary disease) ([place])   . Coronary artery disease    post CABG in 3/07 , coronary stents   . Diabetes mellitus    type 2  . Dyslipidemia   . Fibromyalgia   . GERD  (gastroesophageal reflux disease)   . Headache    hx of   . Hyperlipidemia   . Hypertension   . Myocardial infarction ([place])   . Pneumonia    hx of   . Seizures ([place])    last seizure- 03/2013   . Shortness of breath dyspnea    with exertion or when fluid builds up   . Sleep apnea    used to wear a cpap- not used in 3 years   . Status post dilation of esophageal narrowing   . Stroke [facility] 1993   problems with balance   . Systolic murmur    known mild AS and MR  . Thyroid goiter   . Tobacco abuse     Past Surgical History:  Procedure Laterality Date  . APPENDECTOMY    . BIOPSY  02/03/2019   Procedure: BIOPSY;  Surgeon: Ladene Artist, MD;  Location: [facility];  Service: Endoscopy;;  . BREAST BIOPSY Left 2016  . CARDIAC CATHETERIZATION  11/29/05   EF of 55%  . CARDIAC CATHETERIZATION  08/06/06   EF of 45-50%  . CARDIAC CATHETERIZATION N/A 05/06/2015   Procedure: Right/Left Heart Cath and Coronary/Graft Angiography;  Surgeon: Chaz Mcglasson M Martinique, MD;  Location: [place] [facility];  Service: Cardiovascular;  Laterality: N/A;  . CAROTID PTA/STENT INTERVENTION N/A 10/10/2017   Procedure: CAROTID PTA/STENT INTERVENTION -  Right;  Surgeon: Serafina Mitchell, MD;  Location: [place] [facility];  Service: Cardiovascular;  Laterality: N/A;  . CERVICAL FUSION  1990  . CHOLECYSTECTOMY    . COLON RESECTION     mass removed and 4 in of colon  . COLONOSCOPY WITH PROPOFOL N/A 07/16/2017   Procedure: COLONOSCOPY WITH PROPOFOL;  Surgeon: Ladene Artist, MD;  Location: [facility];  Service: Endoscopy;  Laterality: N/A;  . CORONARY ARTERY BYPASS GRAFT  12/04/2005   x5 -- left internal mammary artery to the LAD, left radial artery to the ramus intermedius, saphenous vein graft to the obtuse marginal 1, sequential saphenous vein grat to the acute marginal and posterior descending, endoscopic vein harvesting from the left thigh with open vein harvest from right leg  . CORONARY STENT PLACEMENT  08/11/06    PCI of her ciurcumflex/OM vessel  . ESOPHAGOGASTRODUODENOSCOPY (EGD) WITH PROPOFOL N/A 02/03/2019   Procedure: ESOPHAGOGASTRODUODENOSCOPY (EGD) WITH PROPOFOL;  Surgeon: Ladene Artist, MD;  Location: [facility];  Service: Endoscopy;  Laterality: N/A;  . LAPAROTOMY Bilateral 05/19/2015   Procedure: EXPLORATORY LAPAROTOMY WITH BILATERAL SALPINGO OOPHORECTOMY /OMENTECTOMY/SEGMENTAL SIGMOID COLECTOMY ;  Surgeon: Everitt Amber, MD;  Location: [facility];  Service: Gynecology;  Laterality: Bilateral;  . PERIPHERAL VASCULAR CATHETERIZATION N/A 11/15/2015   Procedure: Carotid PTA/Stent Intervention;  Surgeon: Lorretta Harp, MD;  Location: [place] [facility];  Service: Cardiovascular;  Laterality: N/A;  . PERIPHERAL VASCULAR CATHETERIZATION  11/15/2015   Procedure: Carotid Angiography;  Surgeon: Lorretta Harp, MD;  Location: [place] [facility];  Service: Cardiovascular;;  . RIGHT HEART CATH N/A 01/27/2020   Procedure: RIGHT HEART CATH;  Surgeon: Larey Dresser, MD;  Location: [place] [facility];  Service: Cardiovascular;  Laterality: N/A;    Family History  Problem Relation Age of Onset  . Heart disease Mother   . Diabetes Mother   . COPD Mother   . Hyperlipidemia Mother   . Hypertension Mother   . Cancer Father        met, origin unknown  . Heart attack Father   . Drug abuse Paternal Grandmother   . Stroke Paternal Grandfather   . Lung cancer Paternal Aunt        lung with mets to brain  . Melanoma Paternal Uncle   . Lung cancer Paternal Uncle        lung/liver to brain  . Cancer Paternal Uncle        cancer of unknown type  . Heart disease Maternal Grandfather   . Hypertension Sister   . Cancer Sister        eyelid  . Glaucoma Sister   . Heart disease Maternal Aunt        x 2 aunts    Social History Social History   Tobacco Use  . Smoking status: Former Smoker    Packs/day: 1.00    Years: 51.00    Pack years: 51.00    Types: Cigarettes    Start date: 55    Quit date:  02/22/2020    Years since quitting: 0.0  . Smokeless tobacco: Never Used  Substance Use Topics  . Alcohol use: No  . Drug use: No    Current Outpatient Medications  Medication Sig Dispense Refill  . acetaminophen (TYLENOL) 500 MG tablet Take 1,000 mg by mouth every 6 (six) hours as needed for moderate pain or headache.    . albuterol (VENTOLIN HFA) 108 (90 Base) MCG/ACT inhaler INHALER 2 PUFFS EVERY  6 HOURS AS NEEDED FOR WHEEZING/SHORTNESS OF BREATH (Patient taking differently: Inhale 2 puffs into the lungs every 6 (six) hours as needed for wheezing or shortness of breath. ) 18 g 0  . aspirin EC 81 MG tablet Take 81 mg by mouth every evening.  30 tablet 6  . clopidogrel (PLAVIX) 75 MG tablet Take 1 tablet (75 mg total) by mouth daily. 90 tablet 3  . dapagliflozin propanediol (FARXIGA) 10 MG TABS tablet Take 1 tablet (10 mg total) by mouth daily before breakfast. 30 tablet 3  . dexlansoprazole (DEXILANT) 60 MG capsule Take 60 mg by mouth daily.    . digoxin (LANOXIN) 0.125 MG tablet TAKE 0.5 TABLETS (0.0625 MG TOTAL) BY MOUTH DAILY. 45 tablet 3  . diphenhydrAMINE (BENADRYL) 25 mg capsule Take 25 mg by mouth 2 (two) times daily as needed for itching or allergies.    . fluticasone (FLONASE) 50 MCG/ACT nasal spray Place 2 sprays into both nostrils daily. 48 g 1  . gabapentin (NEURONTIN) 300 MG capsule TAKE 2 CAPSULES BY MOUTH AT BEDTIME (Patient taking differently: Take 600 mg by mouth at bedtime. ) 180 capsule 1  . glimepiride (AMARYL) 2 MG tablet Take 2 mg by mouth in the morning.     [name] glimepiride (AMARYL) 4 MG tablet Take 4 mg by mouth every evening.     . Glycopyrrolate-Formoterol (BEVESPI AEROSPHERE) 9-4.8 MCG/ACT AERO Inhale 2 puffs into the lungs 2 (two) times daily. 32.1 g 1  . guaiFENesin (MUCINEX) 600 MG 12 hr tablet Take 2 tablets (1,200 mg total) by mouth 2 (two) times daily. 360 tablet 3  . iron polysaccharides (NU-IRON) 150 MG capsule Take 1 capsule (150 mg total) by mouth 2 (two)  times daily. 180 capsule 3  . isosorbide mononitrate (IMDUR) 30 MG 24 hr tablet Take 1 tablet (30 mg total) by mouth daily. (Patient taking differently: Take 30 mg by mouth daily. Take 2 tablets daily) 90 tablet 3  . levETIRAcetam (KEPPRA) 1000 MG tablet Take 1,000 mg by mouth 2 (two) times daily.    . metFORMIN (GLUCOPHAGE-XR) 500 MG 24 hr tablet Take 500 mg by mouth every evening.   6  . metolazone (ZAROXOLYN) 2.5 MG tablet Take 1 tablet (2.5 mg total) by mouth once a week. On mondays 10 tablet 4  . nicotine (NICODERM CQ - DOSED IN MG/24 HOURS) 21 mg/24hr patch Place 21 mg onto the skin daily.    . nicotine polacrilex (NICORETTE) 4 MG gum Take 1 each (4 mg total) by mouth as needed for smoking cessation. 90 tablet 2  . nitroGLYCERIN (NITROLINGUAL) 0.4 MG/SPRAY spray Place 1 spray under the tongue every 5 (five) minutes x 3 doses as needed for chest pain. 12 g 3  . nystatin (NYSTATIN) powder Apply 1 application topically 4 (four) times daily as needed (yeast infections). 45 g 1  . ONE TOUCH ULTRA TEST test strip USE AS DIRECTED FOR TESTING BLOOD GLUCOSE 3 TIMES DAILY  1  . polyethylene glycol powder (GLYCOLAX/MIRALAX) powder TAKE 1 DOSE 17 GRAMS IN 8 OZ OF WATER DAILY AS NEEDED FOR CONSTIPATION. (Patient taking differently: Take 17 g by mouth daily. ) 527 g 3  . potassium chloride SA (KLOR-CON) 20 MEQ tablet Take 2 tablets (40 mEq total) by mouth 2 (two) times daily. (Patient taking differently: Take 20 mEq by mouth 2 (two) times daily. ) 60 tablet 6  . promethazine (PHENERGAN) 25 MG tablet Take 1 tablet (25 mg total) by mouth every 8 (eight)  hours as needed for nausea or vomiting. Caution of sedation 30 tablet 1  . ranolazine (RANEXA) 1000 MG SR tablet Take 1 tablet (1,000 mg total) by mouth 2 (two) times daily. 180 tablet 3  . rosuvastatin (CRESTOR) 20 MG tablet Take 1 tablet (20 mg total) by mouth daily. 90 tablet 3  . sodium chloride (OCEAN) 0.65 % SOLN nasal spray Place 1 spray into both  nostrils as needed for congestion.    [name] spironolactone (ALDACTONE) 25 MG tablet Take 0.5 tablets (12.5 mg total) by mouth at bedtime. 45 tablet 3  . Tiotropium Bromide-Olodaterol (STIOLTO RESPIMAT) 2.5-2.5 MCG/ACT AERS Inhale 2 puffs into the lungs daily. 8 g 0  . Tiotropium Bromide-Olodaterol (STIOLTO RESPIMAT) 2.5-2.5 MCG/ACT AERS Inhale 2 puffs into the lungs daily for 28 days. 8 g 0  . torsemide (DEMADEX) 20 MG tablet Take 3 tablets (60 mg total) by mouth every morning AND 2 tablets (40 mg total) every evening. 150 tablet 4  . TRESIBA FLEXTOUCH 100 UNIT/ML FlexTouch Pen Inject 12 Units into the skin daily.      No current facility-administered medications for this visit.    Allergies  Allergen Reactions  . Bee Venom Itching and Swelling  . Benadryl [Diphenhydramine] Other (See Comments)    Insomnia   . Entresto [Sacubitril-Valsartan] Cough  . Tetracycline     Unknown, pt cannot recall exact reaction  . Ace Inhibitors Cough    Review of Systems   Shortness of breath Fatigue Weight has been stable History of hypertension Still actively smoking with 77-pack-year history No history of thoracic trauma Recent upper endoscopy and biopsy was negative No DVT Easy bruising with the Plavix but no active bleeding  BP 126/75 (BP Location: Right Arm, Patient Position: Sitting, Cuff Size: Small)   Pulse (!) 102   Temp (!) 97 F (36.1 C)   Resp 18   Ht _0  (1.473 m)   Wt 128 lb (58.1 kg)   LMP 09/02/2003   SpO2 92% Comment: RA  BMI 26.75 kg/m  Physical Exam        Exam The patient is very short and is recorded as 4 foot 9 inches in height   General- alert and comfortable    Neck- no JVD, no cervical adenopathy palpable, no carotid bruit   Lungs- clear without rales, wheezes   Cor- regular rate and rhythm, positive systolic ejection murmur right upper sternal border without gallop   Abdomen- soft, non-tender   Extremities - warm, non-tender, minimal edema   Neuro-  oriented, appropriate, no focal weakness   Diagnostic Tests: Images of echo and CT scan reviewed as noted above.  She has a small LV chamber measuring less than 5 cm with a thick LV wall.  Global LV dysfunction EF 20-25% she has to severe MR, moderate TR.  RV function is mild to moderately reduced  Impression: Ischemic cardiomyopathy Very small stature height 4 foot 9 inches with small LV end-diastolic diameter less than 5 cm.  The HeartMate 3 would not function well in such a small ventricle with collapse of the LV chamber with normal pump flows leading to obstruction of inflow and poor HeartMate 3 function.  Would recommend having the patient evaluated for a smaller implantable VAD, the HVAD at another center.  Plan:  She will return to care of her cardiologist and heart failure team Len Childs, MD [facility] [phone]

## 2020-03-07 ENCOUNTER — Other Ambulatory Visit (HOSPITAL_COMMUNITY)
Admission: RE | Admit: 2020-03-07 | Discharge: 2020-03-07 | Disposition: A | Discharge status: Home / Self Care | Payer: Medicare Other | Source: Ambulatory Visit | Attending: Cardiology | Admitting: Cardiology

## 2020-03-07 ENCOUNTER — Other Ambulatory Visit: Payer: Self-pay

## 2020-03-07 ENCOUNTER — Ambulatory Visit (HOSPITAL_COMMUNITY)
Admission: RE | Admit: 2020-03-07 | Discharge: 2020-03-07 | Disposition: A | Discharge status: Home / Self Care | Payer: Medicare Other | Source: Ambulatory Visit | Attending: Cardiology | Admitting: Cardiology

## 2020-03-07 ENCOUNTER — Ambulatory Visit (HOSPITAL_BASED_OUTPATIENT_CLINIC_OR_DEPARTMENT_OTHER)
Admission: RE | Admit: 2020-03-07 | Discharge: 2020-03-07 | Disposition: A | Discharge status: Home / Self Care | Payer: Medicare Other | Source: Ambulatory Visit | Attending: Cardiology | Admitting: Cardiology

## 2020-03-07 ENCOUNTER — Ambulatory Visit (HOSPITAL_COMMUNITY)
Admission: RE | Admit: 2020-03-07 | Discharge: 2020-03-07 | Disposition: A | Discharge status: Home / Self Care | Payer: Medicare Other | Source: Ambulatory Visit | Attending: Internal Medicine | Admitting: Internal Medicine

## 2020-03-07 ENCOUNTER — Ambulatory Visit: Payer: Medicare Other | Admitting: Neurology

## 2020-03-07 DIAGNOSIS — I25708 Atherosclerosis of coronary artery bypass graft(s), unspecified, with other forms of angina pectoris: Secondary | ICD-10-CM

## 2020-03-07 DIAGNOSIS — I255 Ischemic cardiomyopathy: Secondary | ICD-10-CM | POA: Insufficient documentation

## 2020-03-07 DIAGNOSIS — I700 Atherosclerosis of aorta: Secondary | ICD-10-CM | POA: Insufficient documentation

## 2020-03-07 DIAGNOSIS — I5022 Chronic systolic (congestive) heart failure: Secondary | ICD-10-CM | POA: Diagnosis not present

## 2020-03-07 DIAGNOSIS — Z01818 Encounter for other preprocedural examination: Secondary | ICD-10-CM | POA: Insufficient documentation

## 2020-03-07 DIAGNOSIS — Z20822 Contact with and (suspected) exposure to covid-19: Secondary | ICD-10-CM | POA: Diagnosis not present

## 2020-03-07 DIAGNOSIS — K029 Dental caries, unspecified: Secondary | ICD-10-CM | POA: Diagnosis not present

## 2020-03-07 DIAGNOSIS — R918 Other nonspecific abnormal finding of lung field: Secondary | ICD-10-CM | POA: Diagnosis not present

## 2020-03-07 DIAGNOSIS — I714 Abdominal aortic aneurysm, without rupture: Secondary | ICD-10-CM | POA: Diagnosis not present

## 2020-03-07 DIAGNOSIS — R0989 Other specified symptoms and signs involving the circulatory and respiratory systems: Secondary | ICD-10-CM | POA: Diagnosis not present

## 2020-03-07 DIAGNOSIS — J984 Other disorders of lung: Secondary | ICD-10-CM | POA: Diagnosis not present

## 2020-03-07 DIAGNOSIS — E7800 Pure hypercholesterolemia, unspecified: Secondary | ICD-10-CM

## 2020-03-07 DIAGNOSIS — C966 Unifocal Langerhans-cell histiocytosis: Secondary | ICD-10-CM | POA: Diagnosis not present

## 2020-03-07 DIAGNOSIS — K760 Fatty (change of) liver, not elsewhere classified: Secondary | ICD-10-CM | POA: Insufficient documentation

## 2020-03-07 LAB — BASIC METABOLIC PANEL
Anion gap: 9 (ref 5–15)
BUN: 13 mg/dL (ref 8–23)
CO2: 30 mmol/L (ref 22–32)
Calcium: 9.4 mg/dL (ref 8.9–10.3)
Chloride: 99 mmol/L (ref 98–111)
Creatinine, Ser: 0.7 mg/dL (ref 0.44–1.00)
GFR calc Af Amer: >60 mL/min (ref >60)
GFR calc non Af Amer: >60 mL/min (ref >60)
Glucose, Bld: 223 mg/dL — ABNORMAL HIGH (ref 70–99)
Potassium: 4.1 mmol/L (ref 3.5–5.1)
Sodium: 138 mmol/L (ref 135–145)

## 2020-03-07 LAB — LIPID PANEL
Cholesterol: 148 mg/dL (ref 0–200)
HDL: 38 mg/dL — ABNORMAL LOW (ref >40)
LDL Cholesterol: 61 mg/dL (ref 0–99)
Total CHOL/HDL Ratio: 3.9 RATIO
Triglycerides: 247 mg/dL — ABNORMAL HIGH (ref <150)
VLDL: 49 mg/dL — ABNORMAL HIGH (ref 0–40)

## 2020-03-07 LAB — SARS CORONAVIRUS 2 (TAT 6-24 HRS): SARS Coronavirus 2: NEGATIVE

## 2020-03-07 MED ORDER — IOHEXOL 300 MG/ML  SOLN
75.0000 mL | Freq: Once | INTRAMUSCULAR | Status: AC | PRN
  Administered 2020-03-07: 75 mL via INTRAVENOUS

## 2020-03-08 ENCOUNTER — Telehealth (HOSPITAL_COMMUNITY): Payer: Self-pay | Admitting: *Deleted

## 2020-03-08 ENCOUNTER — Other Ambulatory Visit (HOSPITAL_COMMUNITY): Payer: Self-pay | Admitting: *Deleted

## 2020-03-08 DIAGNOSIS — E782 Mixed hyperlipidemia: Secondary | ICD-10-CM

## 2020-03-08 MED ORDER — ICOSAPENT ETHYL 1 G PO CAPS: 2.0000 g | ORAL_CAPSULE | Freq: Two times a day (BID) | ORAL | 3 refills | Status: DC

## 2020-03-08 NOTE — Progress Notes (Signed)
NEUROLOGY FOLLOW UP OFFICE NOTE  Diane [name] [id number]  HISTORY OF PRESENT ILLNESS: Diane [name] is a 63 year old right-handed woman with CAD s/p CABG, hypertension, hyperlipidemia, type 2 diabetes mellitus, tobacco use, depression, fibromyalgia, OSA, COPD and history of stroke who follows up for seizure disorder.  UPDATE: Current medications:  Keppra 1037m twice daily, gabapentin 6064mat bedtime (for neck pain)  No spells.  Last spell 03/18/13.  Her main issue is the chronic dizziness.  She feels unsure of herself walking without the walker.  [facility] months [facility].  Blood sugar was okay.  She was not able to check her blood pressure.    HISTORY: 1.  Seizure disorder: [facility]13 for recurrent seizure-like episodes.These spells are were witnessed by Dr. ChWallie Charneuro-hospitalist, and described as "head turning to the right as well as eye deviation to the right, stiffening of right extremities with flexion of right upper extremity, and convulsive movements of right extremities. Spell lasted about 40-50 seconds and resolved without intervention. Within 30 seconds patient was again awake and conversant. She was slightly confused with respect to time."She is unable to speak during these events.Sometimes she was aware during these events and other times she was unaware and amnestic.She did have urinary incontinence but no tongue biting.It is reported that she can write down what she wants to say during these spells.Given her stroke history, she was evaluated for possible left hemispheric stroke.By his assessment, it seemed that her speech difficulties were non-organic, seizure-like activity non-organic, and he did not suspect acute stroke.  She had continuous EEG monitoring, which captured multiple spells without electrographic correlate. However, she did have  one captured clinical event, described as "build up to rhythmical theta activity and resultant slowing companied by clinical changes.This may very well represent epileptiform activity.No interictal abnormalities were noted".Psychiatry was consulted, regarding possible pseudoseizures.Despite the possible diagnosis of pseudoseizures, the neuro-hospitalists never made a sole diagnosis of pseudoseizures in the hospital notes.  I personally reviewed the EEG.At 1:57:48, patient pauses, head slightly turned left, staring, with little movement and no convulsions.She does pull at the covers a little, but no appreciable stereotypical hand movements.When it is over, she begins moving around again.Electrographically, she does have a build up of generalized theta activity up until 1:58:26, followed by generalized delta slowing lasting until 1:58:45.Difficult to lateralize, but may be more prominent in the left hemisphere.However, this appears to only be a clip of the event and the rest of the study is not available to me.Therefore I cannot comment on the other captured spells.  She also reports episodes of tremor involving the head, hands and legs. It is not positional. It lasts 20 to 30 minutes. She has had it for many years. Blood pressure and blood sugar readings are normal. She says her sister has Parkinson's disease.   Chronic dizziness.  MRI of brain performed on 03/03/13, which reported possible tiny acute infarct in the posterior left frontal lobe, but neurology believed this to be artifact.There was also mention of incidental tiny cysts in right hippocampal region.Encephalomalacia in the right frontoparietal region, from prior remote stroke, is seen.MRI of brain without contrast from 12/08/15 showed chronic right MCA infarct but also showed chronic looking lacunar infarcts in the bilateral basal ganglia and left thalamus, which are new compared to prior imaging from June  2014.  Prior AEDs: Dilantin ER 10029m2.  Cervical radiculopathy: She has history of right sided neck pain radiating down to the right shoulder, associated with tingling from the right ear down to the shoulder.Hot and cold compresses help too.No numbness, pain or weakness down the arm.Pain worse when turning neck to the left.She has trigger finger and numbness in the fingers when driving or picking up objects, thought to be carpal tunnel syndrome.She has history of cervical fusion in the 1980s.PT was ineffective.Marland KitchenMRI of the cervical spine performed on 05/23/14 showed moderately large foraminal disc protrusion and associated osteophyte at C3-4, causing right foraminal encroachment.Solid fusion C5 through C7 noted.She was referred for right C4 nerve root injection, however it was not done.  PAST MEDICAL HISTORY: Past Medical History:  Diagnosis Date  . Anemia   . Carotid stenosis    a. s/p right carotid stent 10/2017.  [name] Chronic systolic CHF (congestive heart failure) ([place])   . Colon polyps   . COPD (chronic obstructive pulmonary disease) ([place])   . Coronary artery disease    post CABG in 3/07 , coronary stents   . Diabetes mellitus    type 2  . Dyslipidemia   . Fibromyalgia   . GERD (gastroesophageal reflux disease)   . Headache    hx of   . Hyperlipidemia   . Hypertension   . Myocardial infarction ([place])   . Pneumonia    hx of   . Seizures ([place])    last seizure- 03/2013   . Shortness of breath dyspnea    with exertion or when fluid builds up   . Sleep apnea    used to wear a cpap- not used in 3 years   . Status post dilation of esophageal narrowing   . Stroke [facility]) 1993   problems with balance   . Systolic murmur    known mild AS and MR  . Thyroid goiter   . Tobacco abuse     MEDICATIONS: Current Outpatient Medications on File Prior to Visit  Medication Sig Dispense Refill  . acetaminophen (TYLENOL) 500 MG tablet Take 1,000 mg by mouth every 6 (six)  hours as needed for moderate pain or headache.    . albuterol (VENTOLIN HFA) 108 (90 Base) MCG/ACT inhaler INHALER 2 PUFFS EVERY 6 HOURS AS NEEDED FOR WHEEZING/SHORTNESS OF BREATH (Patient taking differently: Inhale 2 puffs into the lungs every 6 (six) hours as needed for wheezing or shortness of breath. ) 18 g 0  . aspirin EC 81 MG tablet Take 81 mg by mouth every evening.  30 tablet 6  . clopidogrel (PLAVIX) 75 MG tablet Take 1 tablet (75 mg total) by mouth daily. 90 tablet 3  . dapagliflozin propanediol (FARXIGA) 10 MG TABS tablet Take 1 tablet (10 mg total) by mouth daily before breakfast. 30 tablet 3  . dexlansoprazole (DEXILANT) 60 MG capsule Take 60 mg by mouth daily.    . digoxin (LANOXIN) 0.125 MG tablet TAKE 0.5 TABLETS (0.0625 MG TOTAL) BY MOUTH DAILY. 45 tablet 3  . diphenhydrAMINE (BENADRYL) 25 mg capsule Take 25 mg by mouth 2 (two) times daily as needed for itching or allergies.    . fluticasone (FLONASE) 50 MCG/ACT nasal spray Place 2 sprays into both nostrils daily. 48 g 1  . gabapentin (NEURONTIN) 300 MG capsule TAKE 2 CAPSULES BY MOUTH AT BEDTIME (Patient taking differently: Take 600 mg by mouth at bedtime. ) 180 capsule 1  . glimepiride (AMARYL) 2 MG tablet Take 2 mg by mouth in the morning.     [name]  glimepiride (AMARYL) 4 MG tablet Take 4 mg by mouth every evening.     . Glycopyrrolate-Formoterol (BEVESPI AEROSPHERE) 9-4.8 MCG/ACT AERO Inhale 2 puffs into the lungs 2 (two) times daily. 32.1 g 1  . guaiFENesin (MUCINEX) 600 MG 12 hr tablet Take 2 tablets (1,200 mg total) by mouth 2 (two) times daily. 360 tablet 3  . iron polysaccharides (NU-IRON) 150 MG capsule Take 1 capsule (150 mg total) by mouth 2 (two) times daily. 180 capsule 3  . isosorbide mononitrate (IMDUR) 30 MG 24 hr tablet Take 1 tablet (30 mg total) by mouth daily. (Patient taking differently: Take 30 mg by mouth daily. Take 2 tablets daily) 90 tablet 3  . levETIRAcetam (KEPPRA) 1000 MG tablet Take 1,000 mg by mouth 2  (two) times daily.    . metFORMIN (GLUCOPHAGE-XR) 500 MG 24 hr tablet Take 500 mg by mouth every evening.   6  . metolazone (ZAROXOLYN) 2.5 MG tablet Take 1 tablet (2.5 mg total) by mouth once a week. On mondays 10 tablet 4  . nicotine (NICODERM CQ - DOSED IN MG/24 HOURS) 21 mg/24hr patch Place 21 mg onto the skin daily.    . nicotine polacrilex (NICORETTE) 4 MG gum Take 1 each (4 mg total) by mouth as needed for smoking cessation. 90 tablet 2  . nitroGLYCERIN (NITROLINGUAL) 0.4 MG/SPRAY spray Place 1 spray under the tongue every 5 (five) minutes x 3 doses as needed for chest pain. 12 g 3  . nystatin (NYSTATIN) powder Apply 1 application topically 4 (four) times daily as needed (yeast infections). 45 g 1  . ONE TOUCH ULTRA TEST test strip USE AS DIRECTED FOR TESTING BLOOD GLUCOSE 3 TIMES DAILY  1  . polyethylene glycol powder (GLYCOLAX/MIRALAX) powder TAKE 1 DOSE 17 GRAMS IN 8 OZ OF WATER DAILY AS NEEDED FOR CONSTIPATION. (Patient taking differently: Take 17 g by mouth daily. ) 527 g 3  . potassium chloride SA (KLOR-CON) 20 MEQ tablet Take 2 tablets (40 mEq total) by mouth 2 (two) times daily. (Patient taking differently: Take 20 mEq by mouth 2 (two) times daily. ) 60 tablet 6  . promethazine (PHENERGAN) 25 MG tablet Take 1 tablet (25 mg total) by mouth every 8 (eight) hours as needed for nausea or vomiting. Caution of sedation 30 tablet 1  . ranolazine (RANEXA) 1000 MG SR tablet Take 1 tablet (1,000 mg total) by mouth 2 (two) times daily. 180 tablet 3  . rosuvastatin (CRESTOR) 20 MG tablet Take 1 tablet (20 mg total) by mouth daily. 90 tablet 3  . sodium chloride (OCEAN) 0.65 % SOLN nasal spray Place 1 spray into both nostrils as needed for congestion.    [name] spironolactone (ALDACTONE) 25 MG tablet Take 0.5 tablets (12.5 mg total) by mouth at bedtime. 45 tablet 3  . Tiotropium Bromide-Olodaterol (STIOLTO RESPIMAT) 2.5-2.5 MCG/ACT AERS Inhale 2 puffs into the lungs daily. 8 g 0  . Tiotropium  Bromide-Olodaterol (STIOLTO RESPIMAT) 2.5-2.5 MCG/ACT AERS Inhale 2 puffs into the lungs daily for 28 days. 8 g 0  . torsemide (DEMADEX) 20 MG tablet Take 3 tablets (60 mg total) by mouth every morning AND 2 tablets (40 mg total) every evening. 150 tablet 4  . TRESIBA FLEXTOUCH 100 UNIT/ML FlexTouch Pen Inject 12 Units into the skin daily.      No current facility-administered medications on file prior to visit.    ALLERGIES: Allergies  Allergen Reactions  . Bee Venom Itching and Swelling  . Benadryl [Diphenhydramine] Other (See  Comments)    Insomnia   . Entresto [Sacubitril-Valsartan] Cough  . Tetracycline     Unknown, pt cannot recall exact reaction  . Ace Inhibitors Cough    FAMILY HISTORY: Family History  Problem Relation Age of Onset  . Heart disease Mother   . Diabetes Mother   . COPD Mother   . Hyperlipidemia Mother   . Hypertension Mother   . Cancer Father        met, origin unknown  . Heart attack Father   . Drug abuse Paternal Grandmother   . Stroke Paternal Grandfather   . Lung cancer Paternal Aunt        lung with mets to brain  . Melanoma Paternal Uncle   . Lung cancer Paternal Uncle        lung/liver to brain  . Cancer Paternal Uncle        cancer of unknown type  . Heart disease Maternal Grandfather   . Hypertension Sister   . Cancer Sister        eyelid  . Glaucoma Sister   . Heart disease Maternal Aunt        x 2 aunts    SOCIAL HISTORY: Social History   Socioeconomic History  . Marital status: Divorced    Spouse name: Not on file  . Number of children: 0  . Years of education: Not on file  . Highest education level: Not on file  Occupational History  . Occupation: disabled  Tobacco Use  . Smoking status: Former Smoker    Packs/day: 1.00    Years: 51.00    Pack years: 51.00    Types: Cigarettes    Start date: 61    Quit date: 02/22/2020    Years since quitting: 0.0  . Smokeless tobacco: Never Used  Substance and Sexual Activity    . Alcohol use: No  . Drug use: No  . Sexual activity: Not Currently    Partners: Male    Birth control/protection: None  Other Topics Concern  . Not on file  Social History Narrative  . Not on file   Social Determinants of Health   Financial Resource Strain: Low Risk   . Difficulty of Paying Living Expenses: Not hard at all  Food Insecurity: No Food Insecurity  . Worried About Charity fundraiser in the Last Year: Never true  . Ran Out of Food in the Last Year: Never true  Transportation Needs: No Transportation Needs  . Lack of Transportation (Medical): No  . Lack of Transportation (Non-Medical): No  Physical Activity: Insufficiently Active  . Days of Exercise per Week: 2 days  . Minutes of Exercise per Session: 60 min  Stress: No Stress Concern Present  . Feeling of Stress : Not at all  Social Connections:   . Frequency of Communication with Friends and Family:   . Frequency of Social Gatherings with Friends and Family:   . Attends Religious Services:   . Active Member of Clubs or Organizations:   . Attends Archivist Meetings:   [name] Marital Status:   Intimate Partner Violence: Not At Risk  . Fear of Current or Ex-Partner: No  . Emotionally Abused: No  . Physically Abused: No  . Sexually Abused: No    PHYSICAL EXAM: Blood pressure 98/63, pulse 96, height '4\' 10"'  (1.473 m), weight 128 lb 12.8 oz (58.4 kg), last menstrual period 09/02/2003, SpO2 96 %. General: No acute distress.  Patient appears well-groomed.   Head:  Normocephalic/atraumatic Eyes:  Fundi examined but not visualized Neck: supple, no paraspinal tenderness, full range of motion Heart:  Regular rate and rhythm Lungs:  Clear to auscultation bilaterally Back: No paraspinal tenderness Neurological Exam: alert and oriented to person, place, and time. Attention span and concentration intact, recent and remote memory intact, fund of knowledge intact.  Speech fluent and not dysarthric, language intact.   CN II-XII intact. Bulk and tone normal, muscle strength 5/5 throughout.  Sensation to light touch, temperature and vibration intact.  Deep tendon reflexes 2+ throughout  Finger to nose testing intact.  Gait steady.  IMPRESSION: 1.  Seizure disorder, stable 2.  Cervical radiculopathy, stable  PLAN: 1.  Keppra 1065m twice daily 2.  Gabapentin 6090mat bedtime 3.  Follow up in one year  AdMetta ClinesDO  CC: MaLoura PardonMD

## 2020-03-08 NOTE — Progress Notes (Signed)
Left voicemail message requesting call back to discuss new medication Dwyane Dee) and plan for follow up in heart failure clinic.   Emerson Monte RN [place] Coordinator  Office: [phone]  24/7 Pager: [phone]

## 2020-03-08 NOTE — Telephone Encounter (Signed)
Spoke with patient re: plan to follow up in heart failure clinic with Dr Aundra Dubin in 3 weeks. HF clinic staff will reach out to schedule patient. Made aware of [facility] decision to closely monitor her for now, and determine at a later date if referral to outside facility for VAD placement will be appropriate in the future. Also made aware of need for Valpeca 2g BID per Dr Aundra Dubin. Pt verbalized understanding of all the above.   Emerson Monte RN [place] Coordinator  Office: [phone]  24/7 Pager: [phone]

## 2020-03-09 ENCOUNTER — Ambulatory Visit (HOSPITAL_COMMUNITY)
Admission: RE | Admit: 2020-03-09 | Discharge: 2020-03-09 | Disposition: A | Discharge status: Home / Self Care | Payer: Medicare Other | Source: Ambulatory Visit | Attending: Cardiology | Admitting: Cardiology

## 2020-03-09 ENCOUNTER — Other Ambulatory Visit: Payer: Self-pay

## 2020-03-09 ENCOUNTER — Telehealth (HOSPITAL_COMMUNITY): Payer: Self-pay

## 2020-03-09 DIAGNOSIS — I255 Ischemic cardiomyopathy: Secondary | ICD-10-CM | POA: Insufficient documentation

## 2020-03-09 DIAGNOSIS — J988 Other specified respiratory disorders: Secondary | ICD-10-CM | POA: Diagnosis not present

## 2020-03-09 DIAGNOSIS — I25708 Atherosclerosis of coronary artery bypass graft(s), unspecified, with other forms of angina pectoris: Secondary | ICD-10-CM | POA: Insufficient documentation

## 2020-03-09 DIAGNOSIS — R911 Solitary pulmonary nodule: Secondary | ICD-10-CM

## 2020-03-09 LAB — PULMONARY FUNCTION TEST
DL/VA % pred: 62 %
DL/VA: 2.77 ml/min/mmHg/L
DLCO unc % pred: 66 %
DLCO unc: 9.97 ml/min/mmHg
FEF 25-75 Pre: 1.7 L/sec
FEF2575-%Pred-Pre: 99 %
FEV1-%Pred-Pre: 108 %
FEV1-Pre: 1.87 L
FEV1FVC-%Pred-Pre: 98 %
FEV6-%Pred-Pre: 108 %
FEV6-Pre: 2.35 L
FEV6FVC-%Pred-Pre: 104 %
FVC-%Pred-Pre: 109 %
FVC-Pre: 2.47 L
Pre FEV1/FVC ratio: 75 %
Pre FEV6/FVC Ratio: 100 %
RV % pred: 99 %
RV: 1.68 L
TLC % pred: 106 %
TLC: 4.15 L

## 2020-03-09 NOTE — Telephone Encounter (Signed)
-----   Message from Larey Dresser, MD sent at 03/08/2020  2:30 PM EDT ----- Nodularity at lung apices possibly consistent with Langerhans cell histiocytosis.  She needs high resolution CT chest to assess this closer and needs appointment with pulmonary.

## 2020-03-09 NOTE — Telephone Encounter (Signed)
Patient advised and verbalized understanding. Ct order placed,sent to scheduler.

## 2020-03-10 ENCOUNTER — Ambulatory Visit (INDEPENDENT_AMBULATORY_CARE_PROVIDER_SITE_OTHER): Payer: Medicare Other | Admitting: Neurology

## 2020-03-10 ENCOUNTER — Other Ambulatory Visit: Payer: Self-pay

## 2020-03-10 ENCOUNTER — Encounter: Payer: Self-pay | Admitting: Neurology

## 2020-03-10 VITALS — BP 98/63 | HR 96 | Ht <= 58 in | Wt 128.8 lb

## 2020-03-10 DIAGNOSIS — I255 Ischemic cardiomyopathy: Secondary | ICD-10-CM

## 2020-03-10 DIAGNOSIS — M5010 Cervical disc disorder with radiculopathy, unspecified cervical region: Secondary | ICD-10-CM | POA: Diagnosis not present

## 2020-03-10 DIAGNOSIS — G40009 Localization-related (focal) (partial) idiopathic epilepsy and epileptic syndromes with seizures of localized onset, not intractable, without status epilepticus: Secondary | ICD-10-CM

## 2020-03-10 MED ORDER — GABAPENTIN 300 MG PO CAPS: 600.0000 mg | ORAL_CAPSULE | Freq: Every day | ORAL | 3 refills | Status: DC

## 2020-03-10 MED ORDER — LEVETIRACETAM 1000 MG PO TABS: 1000.0000 mg | ORAL_TABLET | Freq: Two times a day (BID) | ORAL | 3 refills | Status: DC

## 2020-03-10 NOTE — Patient Instructions (Signed)
1.  First, Happy Birthday! 2.  I refilled your levetiracetam and gabapentin to [facility] 3.  Follow up in one year

## 2020-03-14 NOTE — Addendum Note (Signed)
Encounter addended by: Louann Liv, LCSW on: 03/14/2020 2:56 PM  Actions taken: Clinical Note Signed
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
## 2020-03-15 ENCOUNTER — Telehealth: Payer: Self-pay

## 2020-03-15 ENCOUNTER — Telehealth: Payer: Self-pay | Admitting: Unknown Physician Specialty

## 2020-03-15 MED ORDER — FLUCONAZOLE 150 MG PO TABS
ORAL_TABLET | ORAL | 0 refills | Status: DC
Start: 2020-03-15 — End: 2020-03-24

## 2020-03-15 MED ORDER — POTASSIUM CHLORIDE CRYS ER 20 MEQ PO TBCR: 20.0000 meq | EXTENDED_RELEASE_TABLET | Freq: Two times a day (BID) | ORAL | Status: DC

## 2020-03-15 NOTE — Telephone Encounter (Signed)
I sent diflucan  Take one pill today and a 2nd pill in three days   Follow up if no improvement

## 2020-03-15 NOTE — Telephone Encounter (Signed)
Almyra Free nurse with [facility] left v/m that pt has yeast infection and is presently using monistat but difficult for pt to use monistat and Almyra Free [facility]an [facility] [address].pt last seen acute visit on 12/09/19 and 04/23/19 annual visit. Last pap 04/23/19. Tried to reach [place] from [facility] x 2 and left v/m for Almyra Free to cb to [phone] option 4. I called pt and spoke with her and she said Almyra Free the nurse is there with pt. Pt said white cottage cheese vaginal discharge with vaginal and perineal itching started on 03/11/20. Pt said had tried [place] before and that caused a yeast infection. Dr Aundra Dubin recently started pt on Farxiga and now pt has a yeast infection. Pt said yeast infection is possible side effect to [place]. Pt stopped the [place] on 03/12/20 on her own. Pt has called Dr Claris Gladden office but he is out of office and pt is waiting on cb from Dr Aundra Dubin (not sure when will get cb per pt). Pt said she tried using monistat on 03/13/20 and 03/14/20 with slight improvement in symptoms but not a lot. Pt said monistat is so messy when Almyra Free nurse with [facility] told pt there is a pill that can help pt is hopeful for Dr Glori Bickers to [facility] [address]. Pt has no covid symptoms except long time problem of fatigue,weakness, productive cough with grey phlegm and SOB that is no worse than usual., no travel and no known exposure to + covid. Pt said was tested 2 wks ago for covid and was negative and pt has had both her covid vaccines. Pt is not having any swelling of the throat, difficulty breathing or rash.  Pt request cb after Dr Glori Bickers reviews the note.

## 2020-03-15 NOTE — Telephone Encounter (Signed)
Pt notified Rx sent and advised of Dr. Marliss Coots comments

## 2020-03-15 NOTE — Telephone Encounter (Signed)
Pt called to clarify potassium dose. Pt is currently taking potassium 20 mEq twice a day. Informed pt to continue current dose of potassium. She is scheduled to see Dr. Aundra Dubin next week.   Tanda Rockers RN, BSN VAD Coordinator 24/7 Pager [phone]

## 2020-03-22 ENCOUNTER — Ambulatory Visit (HOSPITAL_COMMUNITY): Payer: Medicare Other

## 2020-03-24 ENCOUNTER — Ambulatory Visit (HOSPITAL_COMMUNITY)
Admission: RE | Admit: 2020-03-24 | Discharge: 2020-03-24 | Disposition: A | Discharge status: Home / Self Care | Payer: Medicare Other | Source: Ambulatory Visit | Attending: Cardiology | Admitting: Cardiology

## 2020-03-24 ENCOUNTER — Encounter (HOSPITAL_COMMUNITY): Payer: Self-pay | Admitting: Cardiology

## 2020-03-24 ENCOUNTER — Other Ambulatory Visit: Payer: Self-pay

## 2020-03-24 VITALS — BP 110/65 | HR 93 | Wt 126.8 lb

## 2020-03-24 DIAGNOSIS — I5022 Chronic systolic (congestive) heart failure: Secondary | ICD-10-CM | POA: Diagnosis not present

## 2020-03-24 DIAGNOSIS — D509 Iron deficiency anemia, unspecified: Secondary | ICD-10-CM | POA: Insufficient documentation

## 2020-03-24 DIAGNOSIS — I25708 Atherosclerosis of coronary artery bypass graft(s), unspecified, with other forms of angina pectoris: Secondary | ICD-10-CM

## 2020-03-24 DIAGNOSIS — Z7982 Long term (current) use of aspirin: Secondary | ICD-10-CM | POA: Insufficient documentation

## 2020-03-24 DIAGNOSIS — Z8349 Family history of other endocrine, nutritional and metabolic diseases: Secondary | ICD-10-CM | POA: Diagnosis not present

## 2020-03-24 DIAGNOSIS — G40909 Epilepsy, unspecified, not intractable, without status epilepticus: Secondary | ICD-10-CM | POA: Diagnosis not present

## 2020-03-24 DIAGNOSIS — Z951 Presence of aortocoronary bypass graft: Secondary | ICD-10-CM | POA: Insufficient documentation

## 2020-03-24 DIAGNOSIS — Z8249 Family history of ischemic heart disease and other diseases of the circulatory system: Secondary | ICD-10-CM | POA: Diagnosis not present

## 2020-03-24 DIAGNOSIS — Z955 Presence of coronary angioplasty implant and graft: Secondary | ICD-10-CM | POA: Insufficient documentation

## 2020-03-24 DIAGNOSIS — I739 Peripheral vascular disease, unspecified: Secondary | ICD-10-CM | POA: Diagnosis not present

## 2020-03-24 DIAGNOSIS — G4733 Obstructive sleep apnea (adult) (pediatric): Secondary | ICD-10-CM | POA: Insufficient documentation

## 2020-03-24 DIAGNOSIS — Z833 Family history of diabetes mellitus: Secondary | ICD-10-CM | POA: Diagnosis not present

## 2020-03-24 DIAGNOSIS — Z8673 Personal history of transient ischemic attack (TIA), and cerebral infarction without residual deficits: Secondary | ICD-10-CM | POA: Diagnosis not present

## 2020-03-24 DIAGNOSIS — Z79899 Other long term (current) drug therapy: Secondary | ICD-10-CM | POA: Insufficient documentation

## 2020-03-24 DIAGNOSIS — Z794 Long term (current) use of insulin: Secondary | ICD-10-CM | POA: Diagnosis not present

## 2020-03-24 DIAGNOSIS — I110 Hypertensive heart disease with heart failure: Secondary | ICD-10-CM | POA: Diagnosis not present

## 2020-03-24 DIAGNOSIS — E785 Hyperlipidemia, unspecified: Secondary | ICD-10-CM | POA: Diagnosis not present

## 2020-03-24 DIAGNOSIS — Z7902 Long term (current) use of antithrombotics/antiplatelets: Secondary | ICD-10-CM | POA: Diagnosis not present

## 2020-03-24 DIAGNOSIS — Z87891 Personal history of nicotine dependence: Secondary | ICD-10-CM | POA: Insufficient documentation

## 2020-03-24 DIAGNOSIS — I2510 Atherosclerotic heart disease of native coronary artery without angina pectoris: Secondary | ICD-10-CM | POA: Diagnosis not present

## 2020-03-24 DIAGNOSIS — I255 Ischemic cardiomyopathy: Secondary | ICD-10-CM | POA: Diagnosis not present

## 2020-03-24 DIAGNOSIS — E1151 Type 2 diabetes mellitus with diabetic peripheral angiopathy without gangrene: Secondary | ICD-10-CM | POA: Diagnosis not present

## 2020-03-24 LAB — BASIC METABOLIC PANEL
Anion gap: 12 (ref 5–15)
BUN: 40 mg/dL — ABNORMAL HIGH (ref 8–23)
CO2: 31 mmol/L (ref 22–32)
Calcium: 9.6 mg/dL (ref 8.9–10.3)
Chloride: 88 mmol/L — ABNORMAL LOW (ref 98–111)
Creatinine, Ser: 0.94 mg/dL (ref 0.44–1.00)
GFR calc Af Amer: >60 mL/min (ref >60)
GFR calc non Af Amer: >60 mL/min (ref >60)
Glucose, Bld: 322 mg/dL — ABNORMAL HIGH (ref 70–99)
Potassium: 3.6 mmol/L (ref 3.5–5.1)
Sodium: 131 mmol/L — ABNORMAL LOW (ref 135–145)

## 2020-03-24 LAB — DIGOXIN LEVEL: Digoxin Level: 0.9 ng/mL (ref 0.8–2.0)

## 2020-03-24 MED ORDER — SPIRONOLACTONE 25 MG PO TABS
25.0000 mg | ORAL_TABLET | Freq: Every day | ORAL | 3 refills | Status: DC
Start: 2020-03-24 — End: 2021-02-02

## 2020-03-24 NOTE — Patient Instructions (Signed)
Increase Spironolactone to 25 mg (1 tab) Daily  Labs done today, your results will be available in MyChart, we will contact you for abnormal readings.  Labs needed again in about 2 weeks  Please follow up with our heart failure pharmacist in 3-4 weeks  Your physician recommends that you schedule a follow-up appointment in: 6-8 weeks with Dr Aundra Dubin  If you have any questions or concerns before your next appointment please send [place] a message through [place] or call our office at [phone].    TO LEAVE A MESSAGE FOR THE NURSE SELECT OPTION 2, PLEASE LEAVE A MESSAGE INCLUDING: . YOUR NAME . DATE OF BIRTH . CALL BACK NUMBER . REASON FOR CALL**this is important as we prioritize the call backs  [place] AS LONG AS YOU CALL BEFORE 4:00 PM  At the [facility], you and your health needs are our priority. As part of our continuing mission to provide you with exceptional heart care, we have created designated Provider Care Teams. These Care Teams include your primary Cardiologist (physician) and Advanced Practice Providers (APPs- Physician Assistants and Nurse Practitioners) who all work together to provide you with the care you need, when you need it.   You may see any of the following providers on your designated Care Team at your next follow up: [name] Dr Glori Bickers . Dr Loralie Champagne . Darrick Grinder, NP . Lyda Jester, PA . Audry Riles, PharmD   Please be sure to bring in all your medications bottles to every appointment.

## 2020-03-24 NOTE — Progress Notes (Signed)
PCP: [name], MD Cardiology: Dr. Martinique HF Cardiology: Dr. Aundra Dubin  64 y.o. with history of CAD, ischemic cardiomyopathy, CVA, and valvular disease was referred by Dr. Martinique for CHF evaluation.  Patient has an extensive history of vascular disease.  She had CABG in 2007.  Repeat LHC in 2007 showed all grafts closed except LIMA-LAD.  Patient had DES to LCx/OM.  Overly 2016 showed occlusion of DES to LCx/OM with L-L and L-R collaterals. Poor targets for redo CABG.   In 2/21, echo was done showing EF 20-25% with moderate RV dysfunction, moderate-severe MR, moderate TR.  Despite smoking history, PFTs in 2019 looked relatively normal. She additionally had the placement of a carotid stent on the right in 1/19.    RHC was done in 4/21, showing near-normal filling pressures with CI 2.12 by Fick.  CPX in 5/21 showed peak VO2 13, VE/VCO2 slope 37 => moderate HF limitation.  The PFTs on the CPX were not markedly abnormal. She saw Dr. Gwenlyn Found recently, peripheral arterial dopplers in 4/21 showed significant stenosis right SFA.   We worked her up for LVAD.  She was presented at [facility] and saw Dr. Prescott Gum.  He was concerned by her small stature and small LV cavity. Concern that Heartmate 3 would not function well in her small LV, and Heartware LVAD that was thought to be more suitable for small patients has been discontinued.   Patient returns for followup of CHF.  She is off dapagliflozin due to recurrrent GU yeast infections. She is tolerating spironolactone 12.5 daily without excessive lightheadedness.  She has generalized weakness and is short of breath after walking 50-100 feet.  She seems, however, more limited by stable bilateral calf claudication.  No rest pain or pedal ulcers.  She uses a rollator.  She is staying off cigarettes.  Weight down 6 lbs.  Pulse sometimes drops to the 40s-50s, but she denies lightheadedness.   Labs (4/21): K 3.7 => 3.4, creatinine 0.78 => 0.7, hgb 11.3 => 14.4 Labs (5/21):  digoxin 0.6, K 3.5, creatinine 0.63 Labs (6/21): K 4.1, creatinine 0.7, LDL 61, TGs 247  REDS clip 33%  PMH: 1. CAD: CABG 2007.  - LHC in 2007 showed all grafts closed except LIMA-LAD.  Patient had DES to LCx/OM.   - [place] 2016 showed occlusion of DES to LCx/OM with L-L and L-R collaterals.  - Not candidate for redo CABG with poor targets.  2. Chronic systolic CHF: Ischemic cardiomyopathy.   - Echo (2/21): EF 20-25%, moderately dilated and moderately dysfunctional RV, PASP 49, severe biatrial enlargement, moderate-severe MR, severe TR, mild-moderate AS, IVC dilated.  - RHC (4/21): mean RA 6, PA 41/16, mean 29, mean PCWP 15, CI 2.12, PVR 4.3 WU - CPX (5/21): peak VO2 13, VE/VCO2 slope 37, RER 1.06 => moderate HF limitation.  3. Fe deficiency anemia: FOBT negative.  4. Carotid stenosis: Right carotid stent in 1/19.  - Carotid dopplers (2/09): 47-09% LICA stenosis.  - Carotid dopplers (6/28): 36-62% LICA stenosis.  5. H/o CVA 6. HTN 7. Type 2 diabetes 8. Hyperlipidemia 9. Seizure disorder 10. OSA: Cannot tolerate CPAP.  11. Active smoker: PFTs in 2019 were relatively normal.  She does use oxygen at night.  - PFTs (5/21): FEV1 82%, FVC 80%, ratio 102% => minimal obstruction.  12. PAD: ABIs (4/21) 0.76 on right, 0.86 on left => significant right SFA stenosis.  13. Chronic penetrating atherosclerotic ulcer descending thoracic aorta.   Social History   Socioeconomic History  .  Marital status: Divorced    Spouse name: Not on file  . Number of children: 0  . Years of education: Not on file  . Highest education level: Not on file  Occupational History  . Occupation: disabled  Tobacco Use  . Smoking status: Former Smoker    Packs/day: 1.00    Years: 51.00    Pack years: 51.00    Types: Cigarettes    Start date: 32    Quit date: 02/22/2020    Years since quitting: 0.0  . Smokeless tobacco: Never Used  Vaping Use  . Vaping Use: Former  Substance and Sexual Activity  . Alcohol  use: No  . Drug use: No  . Sexual activity: Not Currently    Partners: Male    Birth control/protection: None  Other Topics Concern  . Not on file  Social History Narrative   Right handed   Lives alone in a one story home   Social Determinants of Health   Financial Resource Strain: Low Risk   . Difficulty of Paying Living Expenses: Not hard at all  Food Insecurity: No Food Insecurity  . Worried About Charity fundraiser in the Last Year: Never true  . Ran Out of Food in the Last Year: Never true  Transportation Needs: No Transportation Needs  . Lack of Transportation (Medical): No  . Lack of Transportation (Non-Medical): No  Physical Activity: Insufficiently Active  . Days of Exercise per Week: 2 days  . Minutes of Exercise per Session: 60 min  Stress: No Stress Concern Present  . Feeling of Stress : Not at all  Social Connections:   . Frequency of Communication with Friends and Family:   . Frequency of Social Gatherings with Friends and Family:   . Attends Religious Services:   . Active Member of Clubs or Organizations:   . Attends Archivist Meetings:   [name] Marital Status:   Intimate Partner Violence: Not At Risk  . Fear of Current or Ex-Partner: No  . Emotionally Abused: No  . Physically Abused: No  . Sexually Abused: No   Family History  Problem Relation Age of Onset  . Heart disease Mother   . Diabetes Mother   . COPD Mother   . Hyperlipidemia Mother   . Hypertension Mother   . Cancer Father        met, origin unknown  . Heart attack Father   . Drug abuse Paternal Grandmother   . Stroke Paternal Grandfather   . Lung cancer Paternal Aunt        lung with mets to brain  . Melanoma Paternal Uncle   . Lung cancer Paternal Uncle        lung/liver to brain  . Cancer Paternal Uncle        cancer of unknown type  . Heart disease Maternal Grandfather   . Hypertension Sister   . Cancer Sister        eyelid  . Glaucoma Sister   . Heart disease Maternal  Aunt        x 2 aunts   ROS: All systems reviewed and negative except as per HPI.   Current Outpatient Medications  Medication Sig Dispense Refill  . acetaminophen (TYLENOL) 500 MG tablet Take 1,000 mg by mouth every 6 (six) hours as needed for moderate pain or headache.    . albuterol (VENTOLIN HFA) 108 (90 Base) MCG/ACT inhaler Inhale 2 puffs into the lungs daily as needed for wheezing or  shortness of breath.    [name] aspirin EC 81 MG tablet Take 81 mg by mouth every evening.  30 tablet 6  . clopidogrel (PLAVIX) 75 MG tablet Take 1 tablet (75 mg total) by mouth daily. 90 tablet 3  . dexlansoprazole (DEXILANT) 60 MG capsule Take 60 mg by mouth daily.    . digoxin (LANOXIN) 0.125 MG tablet TAKE 0.5 TABLETS (0.0625 MG TOTAL) BY MOUTH DAILY. 45 tablet 3  . diphenhydrAMINE (BENADRYL) 25 mg capsule Take 25 mg by mouth 2 (two) times daily as needed for itching or allergies.    . fluticasone (FLONASE) 50 MCG/ACT nasal spray Place 2 sprays into both nostrils daily. 48 g 1  . gabapentin (NEURONTIN) 300 MG capsule Take 2 capsules (600 mg total) by mouth at bedtime. 180 capsule 3  . glimepiride (AMARYL) 4 MG tablet Take 4 mg by mouth in the morning and at bedtime.    . Glycopyrrolate-Formoterol (BEVESPI AEROSPHERE) 9-4.8 MCG/ACT AERO Inhale 2 puffs into the lungs 2 (two) times daily. 32.1 g 1  . guaiFENesin (MUCINEX) 600 MG 12 hr tablet Take 2 tablets (1,200 mg total) by mouth 2 (two) times daily. 360 tablet 3  . icosapent Ethyl (VASCEPA) 1 g capsule Take 2 capsules (2 g total) by mouth 2 (two) times daily. 120 capsule 3  . iron polysaccharides (NU-IRON) 150 MG capsule Take 1 capsule (150 mg total) by mouth 2 (two) times daily. 180 capsule 3  . isosorbide mononitrate (IMDUR) 30 MG 24 hr tablet Take 60 mg by mouth daily.    [name] levETIRAcetam (KEPPRA) 1000 MG tablet Take 1 tablet (1,000 mg total) by mouth 2 (two) times daily. 180 tablet 3  . metFORMIN (GLUCOPHAGE-XR) 500 MG 24 hr tablet Take 500 mg by mouth  every evening.   6  . metolazone (ZAROXOLYN) 2.5 MG tablet Take 1 tablet (2.5 mg total) by mouth once a week. On mondays 10 tablet 4  . nicotine (NICODERM CQ - DOSED IN MG/24 HOURS) 21 mg/24hr patch Place 21 mg onto the skin daily.    . nicotine polacrilex (NICORETTE) 4 MG gum Take 1 each (4 mg total) by mouth as needed for smoking cessation. 90 tablet 2  . nitroGLYCERIN (NITROLINGUAL) 0.4 MG/SPRAY spray Place 1 spray under the tongue every 5 (five) minutes x 3 doses as needed for chest pain. 12 g 3  . nystatin (NYSTATIN) powder Apply 1 application topically 4 (four) times daily as needed (yeast infections). 45 g 1  . ONE TOUCH ULTRA TEST test strip USE AS DIRECTED FOR TESTING BLOOD GLUCOSE 3 TIMES DAILY  1  . polyethylene glycol (MIRALAX / GLYCOLAX) 17 g packet Take 17 g by mouth daily.    . potassium chloride SA (KLOR-CON) 20 MEQ tablet Take 1 tablet (20 mEq total) by mouth 2 (two) times daily.    . promethazine (PHENERGAN) 25 MG tablet Take 1 tablet (25 mg total) by mouth every 8 (eight) hours as needed for nausea or vomiting. Caution of sedation 30 tablet 1  . ranolazine (RANEXA) 1000 MG SR tablet Take 1 tablet (1,000 mg total) by mouth 2 (two) times daily. 180 tablet 3  . rosuvastatin (CRESTOR) 20 MG tablet Take 40 mg by mouth daily.    . sodium chloride (OCEAN) 0.65 % SOLN nasal spray Place 1 spray into both nostrils as needed for congestion.    [name] spironolactone (ALDACTONE) 25 MG tablet Take 1 tablet (25 mg total) by mouth at bedtime. 90 tablet 3  .  torsemide (DEMADEX) 20 MG tablet Take 3 tablets (60 mg total) by mouth every morning AND 2 tablets (40 mg total) every evening. 150 tablet 4  . TRESIBA FLEXTOUCH 100 UNIT/ML FlexTouch Pen Inject 24 Units into the skin daily.      No current facility-administered medications for this encounter.   BP 110/65   Pulse 93   Wt 57.5 kg (126 lb 12.8 oz)   LMP 09/02/2003   SpO2 98%   BMI 26.50 kg/m  General: NAD Neck: No JVD, no thyromegaly or  thyroid nodule.  Left carotid bruit.  Lungs: Clear to auscultation bilaterally with normal respiratory effort. CV: Nondisplaced PMI.  Heart regular S1/S2, no S3/S4, no murmur.  No peripheral edema.  No carotid bruit.  Unable to palpate pedal pulses.  Abdomen: Soft, nontender, no hepatosplenomegaly, no distention.  Skin: Intact without lesions or rashes.  Neurologic: Alert and oriented x 3.  Psych: Normal affect. Extremities: No clubbing or cyanosis.  HEENT: Normal.   Assessment/Plan:  1. Chronic systolic CHF: Ischemic cardiomyopathy.  Echo in 2/21 showed EF 20-25%, moderately dilated and moderately dysfunctional RV, PASP 49, severe biatrial enlargement, moderate-severe MR, severe TR, mild-moderate AS, IVC dilated. CPX (5/21) with moderate HF limitation, RHC (4/21) with CI low at 2.12.  Medication titration has been severely limited by hypotension and orthostatic symptoms.  NYHA class III-IV symptoms, very limited chronically.  She does not appear significantly volume overloaded today by exam.  - Continue digoxin 0.0625 mg daily, check level today.   - Continue torsemide 60 qam/40 qpm.  She will take metolazone 2.5 mg once a week on Mondays. BMET today.  - Increase spironolactone to 25 mg daily with BMET 10 days.  - She has not tolerated BP-active meds well, avoid Coreg and Entresto for now.  She became hypotensive with losartan.  - She did not tolerate SGLT2 inhibitor due to recurrent GU yeast infections.  - She may be a vericiguat candidate in the future.  - NYHA class III-IV symptoms with marginal cardiac index (2.1).  Moderate HF limitation on CPX.  Medication titration limited by orthostatic symptoms.  We evaluated her for LVAD.  However, with small stature and small LV cavity, thought by surgeon not to be a good HM3 candidate (also would be redo sternotomy).  Heartware no longer available.  Instead, I will see if she qualifies for barostimulation activation therapy through the ANTHEM trial.    2. CAD: s/p CABG 6295 complicated by early graft closure.  Repeat cath in 2007 showed only LIMA-LAD still open and she had DES to LCX/OM.  Cath in 2016 showed this stent was occluded.  She has not had targets for redo CABG and she does not have good interventional targets. No recent CP. - Continue ASA 81 and Crestor 10 mg daily. Good LDL in 6/21.  - Continue ranolazone 1000 bid.  - Continue Imdur 60 mg daily.  3. Smoking: She quit recently.     4. PAD: Significant right SFA stenosis with claudication, no rest symptoms or ulcers.  With no current plan for LVAD, will ask Dr. Gwenlyn Found to see her again regarding right SFA intervention to allow her to get more active.   5. Carotid stenosis: Moderate stenosis, repeat dopplers 6/22.   6. Fe deficiency anemia: Has been FOBT negative.  Has had Ferahema.  Most recent hgb looks stable.   Followup in 3 wks with pharmacist (would she be a vericiguat candidate?), 6 wks with me.   Loralie Champagne 03/24/2020

## 2020-03-28 ENCOUNTER — Telehealth: Payer: Self-pay | Admitting: Cardiology

## 2020-03-28 NOTE — Telephone Encounter (Signed)
Patient calling stating she is returning a call about being worked in to see Dr. Martinique next week. I did not see a note.

## 2020-03-28 NOTE — Telephone Encounter (Signed)
Spoke to patient Dr.Berry advised to schedule appointment with him to discuss RSFA intervention.Appointment scheduled 03/30/20 at 4:15 pm.

## 2020-03-29 ENCOUNTER — Other Ambulatory Visit: Payer: Self-pay | Admitting: Pulmonary Disease

## 2020-03-29 DIAGNOSIS — J9611 Chronic respiratory failure with hypoxia: Secondary | ICD-10-CM

## 2020-03-30 ENCOUNTER — Ambulatory Visit (INDEPENDENT_AMBULATORY_CARE_PROVIDER_SITE_OTHER): Payer: Medicare Other | Admitting: Cardiovascular Disease

## 2020-03-30 ENCOUNTER — Other Ambulatory Visit: Payer: Self-pay | Admitting: Medical Oncology

## 2020-03-30 ENCOUNTER — Encounter: Payer: Self-pay | Admitting: Cardiovascular Disease

## 2020-03-30 ENCOUNTER — Other Ambulatory Visit: Payer: Self-pay

## 2020-03-30 VITALS — BP 106/64 | HR 94 | Ht <= 58 in | Wt 126.0 lb

## 2020-03-30 DIAGNOSIS — I25708 Atherosclerosis of coronary artery bypass graft(s), unspecified, with other forms of angina pectoris: Secondary | ICD-10-CM

## 2020-03-30 DIAGNOSIS — I739 Peripheral vascular disease, unspecified: Secondary | ICD-10-CM

## 2020-03-30 DIAGNOSIS — I255 Ischemic cardiomyopathy: Secondary | ICD-10-CM

## 2020-03-30 DIAGNOSIS — D509 Iron deficiency anemia, unspecified: Secondary | ICD-10-CM

## 2020-03-30 MED ORDER — SODIUM CHLORIDE 0.9% FLUSH: 3.0000 mL | Freq: Two times a day (BID) | INTRAVENOUS | Status: DC

## 2020-03-30 NOTE — Assessment & Plan Note (Signed)
Diane [name] returns today for follow-up.  She is ready to pursue right SFA intervention for lifestyle limiting claudication.  She had Dopplers performed 01/26/2020 revealing a right ABI of 0.76 and a left of 0.86.  She had a fine high-frequency signal in her proximal right SFA.  We plan to proceed with angiography potential endovascular therapy for lifestyle limiting claudication.

## 2020-03-30 NOTE — Progress Notes (Signed)
03/30/2020 [place]   10/16/1955  [id number]  Primary Physician Tower, [name], MD Primary Cardiologist: Lorretta Harp MD Garret Reddish, [place], [place]  HPI:  Diane [name] is a 64 y.o.  Caucasian female with a history of CAD status post coronary artery bypass grafting March 2007 by Dr. [name].I last saw her in the office  02/10/2020..She's had subsequent stenting and known occluded grafts except for her her LIMA. She has severe LV dysfunction with moderate pulmonary hypertension. I last saw her in the office 06/20/16. Her mother, [name], was a long-term patient of mine as well. Her other Problems include diabetes, hypokalemia, hypertension and ongoing tobacco abuse. She's had progression of her bilateral carotid artery stenosis left greater than right . She is neurologically asymptomatic. She is high risk for carotid endarterectomy. I performed cerebral angiography and her 11/15/15 revealing 70% bilateral ICA stenosis with pins beyond the stenosis and significant calcification on the left making stenting not appropriate. Her carotid Dopplers performed on 06/26/16 showed stable bilateral moderate ICA stenosis and lower extremity Dopplers performed 12/12/16 revealed ABIs 0.93 bilaterally with moderate but not severe disease. She does complain of some leg pain which has some atypical characteristics for claudication  She does continue to smoke but is trying to stop.  She has ischemic cardiomyopathy with an EF of 20% range followed by Dr. Algernon Huxley.  Her last cath in 2016 by Dr. Martinique revealed occluded grafts with poor targets for redo CABG.  I did perform carotid stenting on her with Dr. Trula Slade 10/10/2017.  She has less tolerating claudication with ABIs in the 0.7-0.8 range bilaterally.  She has high-frequency signal in her proximal right SFA.  She apparently was turned down for LVAD therapy because her heart was too small.  She is compensated.  She wishes to proceed with right SFA  intervention for lifestyle limiting claudication.   Current Meds  Medication Sig  . acetaminophen (TYLENOL) 500 MG tablet Take 1,000 mg by mouth every 6 (six) hours as needed for moderate pain or headache.  . albuterol (VENTOLIN HFA) 108 (90 Base) MCG/ACT inhaler INHALER 2 PUFFS EVERY 6 HOURS AS NEEDED FOR WHEEZING/SHORTNESS OF BREATH  . aspirin EC 81 MG tablet Take 81 mg by mouth every evening.   . clopidogrel (PLAVIX) 75 MG tablet Take 1 tablet (75 mg total) by mouth daily.  [name] dexlansoprazole (DEXILANT) 60 MG capsule Take 60 mg by mouth daily.  . digoxin (LANOXIN) 0.125 MG tablet TAKE 0.5 TABLETS (0.0625 MG TOTAL) BY MOUTH DAILY.  . diphenhydrAMINE (BENADRYL) 25 mg capsule Take 25 mg by mouth 2 (two) times daily as needed for itching or allergies.  . fluticasone (FLONASE) 50 MCG/ACT nasal spray Place 2 sprays into both nostrils daily.  [name] gabapentin (NEURONTIN) 300 MG capsule Take 2 capsules (600 mg total) by mouth at bedtime.  [name] glimepiride (AMARYL) 4 MG tablet Take 4 mg by mouth in the morning and at bedtime.  . Glycopyrrolate-Formoterol (BEVESPI AEROSPHERE) 9-4.8 MCG/ACT AERO Inhale 2 puffs into the lungs 2 (two) times daily.  [name] guaiFENesin (MUCINEX) 600 MG 12 hr tablet Take 2 tablets (1,200 mg total) by mouth 2 (two) times daily.  [name] icosapent Ethyl (VASCEPA) 1 g capsule Take 2 capsules (2 g total) by mouth 2 (two) times daily.  . iron polysaccharides (NU-IRON) 150 MG capsule Take 1 capsule (150 mg total) by mouth 2 (two) times daily.  . isosorbide mononitrate (IMDUR) 30 MG 24 hr tablet Take 60 mg  by mouth daily.  [name] levETIRAcetam (KEPPRA) 1000 MG tablet Take 1 tablet (1,000 mg total) by mouth 2 (two) times daily.  . metFORMIN (GLUCOPHAGE-XR) 500 MG 24 hr tablet Take 500 mg by mouth every evening.   . metolazone (ZAROXOLYN) 2.5 MG tablet Take 1 tablet (2.5 mg total) by mouth once a week. On mondays  . nicotine (NICODERM CQ - DOSED IN MG/24 HOURS) 21 mg/24hr patch Place 21 mg onto the skin  daily.  . nicotine polacrilex (NICORETTE) 4 MG gum Take 1 each (4 mg total) by mouth as needed for smoking cessation.  . nitroGLYCERIN (NITROLINGUAL) 0.4 MG/SPRAY spray Place 1 spray under the tongue every 5 (five) minutes x 3 doses as needed for chest pain.  [name] nystatin (NYSTATIN) powder Apply 1 application topically 4 (four) times daily as needed (yeast infections).  . ONE TOUCH ULTRA TEST test strip USE AS DIRECTED FOR TESTING BLOOD GLUCOSE 3 TIMES DAILY  . polyethylene glycol (MIRALAX / GLYCOLAX) 17 g packet Take 17 g by mouth daily.  . potassium chloride SA (KLOR-CON) 20 MEQ tablet Take 1 tablet (20 mEq total) by mouth 2 (two) times daily.  . promethazine (PHENERGAN) 25 MG tablet Take 1 tablet (25 mg total) by mouth every 8 (eight) hours as needed for nausea or vomiting. Caution of sedation  . ranolazine (RANEXA) 1000 MG SR tablet Take 1 tablet (1,000 mg total) by mouth 2 (two) times daily.  . rosuvastatin (CRESTOR) 20 MG tablet Take 40 mg by mouth daily.  . sodium chloride (OCEAN) 0.65 % SOLN nasal spray Place 1 spray into both nostrils as needed for congestion.  [name] spironolactone (ALDACTONE) 25 MG tablet Take 1 tablet (25 mg total) by mouth at bedtime.  . torsemide (DEMADEX) 20 MG tablet Take 3 tablets (60 mg total) by mouth every morning AND 2 tablets (40 mg total) every evening.  Tyler Aas FLEXTOUCH 100 UNIT/ML FlexTouch Pen Inject 24 Units into the skin daily.      Allergies  Allergen Reactions  . Bee Venom Itching and Swelling  . Benadryl [Diphenhydramine] Other (See Comments)    Insomnia   . Entresto [Sacubitril-Valsartan] Cough  . Farxiga [Dapagliflozin]     Yeast infection  . Tetracycline     Unknown, pt cannot recall exact reaction  . Ace Inhibitors Cough    Social History   Socioeconomic History  . Marital status: Divorced    Spouse name: Not on file  . Number of children: 0  . Years of education: Not on file  . Highest education level: Not on file  Occupational  History  . Occupation: disabled  Tobacco Use  . Smoking status: Current Every Day Smoker    Packs/day: 1.00    Years: 51.00    Pack years: 51.00    Types: Cigarettes    Start date: 38    Last attempt to quit: 02/22/2020    Years since quitting: 0.1  . Smokeless tobacco: Never Used  Vaping Use  . Vaping Use: Former  Substance and Sexual Activity  . Alcohol use: No  . Drug use: No  . Sexual activity: Not Currently    Partners: Male    Birth control/protection: None  Other Topics Concern  . Not on file  Social History Narrative   Right handed   Lives alone in a one story home   Social Determinants of Health   Financial Resource Strain: Low Risk   . Difficulty of Paying Living Expenses: Not hard at all  Food Insecurity: No Food Insecurity  . Worried About Charity fundraiser in the Last Year: Never true  . Ran Out of Food in the Last Year: Never true  Transportation Needs: No Transportation Needs  . Lack of Transportation (Medical): No  . Lack of Transportation (Non-Medical): No  Physical Activity: Insufficiently Active  . Days of Exercise per Week: 2 days  . Minutes of Exercise per Session: 60 min  Stress: No Stress Concern Present  . Feeling of Stress : Not at all  Social Connections:   . Frequency of Communication with Friends and Family:   . Frequency of Social Gatherings with Friends and Family:   . Attends Religious Services:   . Active Member of Clubs or Organizations:   . Attends Archivist Meetings:   [name] Marital Status:   Intimate Partner Violence: Not At Risk  . Fear of Current or Ex-Partner: No  . Emotionally Abused: No  . Physically Abused: No  . Sexually Abused: No     Review of Systems: General: negative for chills, fever, night sweats or weight changes.  Cardiovascular: negative for chest pain, dyspnea on exertion, edema, orthopnea, palpitations, paroxysmal nocturnal dyspnea or shortness of breath Dermatological: negative for  rash Respiratory: negative for cough or wheezing Urologic: negative for hematuria Abdominal: negative for nausea, vomiting, diarrhea, bright red blood per rectum, melena, or hematemesis Neurologic: negative for visual changes, syncope, or dizziness All other systems reviewed and are otherwise negative except as noted above.    Blood pressure 106/64, pulse 94, height 4\' 10"  (1.473 m), weight 126 lb (57.2 kg), last menstrual period 09/02/2003, SpO2 95 %.  General appearance: alert and no distress Neck: no adenopathy, no carotid bruit, no JVD, supple, symmetrical, trachea midline and thyroid not enlarged, symmetric, no tenderness/mass/nodules Lungs: clear to auscultation bilaterally Heart: regular rate and rhythm, S1, S2 normal, no murmur, click, rub or gallop Extremities: extremities normal, atraumatic, no cyanosis or edema Pulses: 2+ and symmetric Skin: Skin color, texture, turgor normal. No rashes or lesions Neurologic: Alert and oriented X 3, normal strength and tone. Normal symmetric reflexes. Normal coordination and gait  EKG not performed today  ASSESSMENT AND PLAN:   Claudication in peripheral vascular disease [facility]) Ms. Stander returns today for follow-up.  She is ready to pursue right SFA intervention for lifestyle limiting claudication.  She had Dopplers performed 01/26/2020 revealing a right ABI of 0.76 and a left of 0.86.  She had a fine high-frequency signal in her proximal right SFA.  We plan to proceed with angiography potential endovascular therapy for lifestyle limiting claudication.      Lorretta Harp MD FACP,FACC,FAHA, [facility] 03/30/2020 4:48 PM

## 2020-03-30 NOTE — H&P (View-Only) (Signed)
03/30/2020 Diane [name]   07/15/1956  [id number]  Primary Physician Tower, [name], MD Primary Cardiologist: Lorretta Harp MD Garret Reddish, [place], [place]  HPI:  BRENN [name] is a 64 y.o.  Caucasian female with a history of CAD status post coronary artery bypass grafting March 2007 by Dr. [name].I last saw her in the office  02/10/2020..She's had subsequent stenting and known occluded grafts except for her her LIMA. She has severe LV dysfunction with moderate pulmonary hypertension. I last saw her in the office 06/20/16. Her mother, Diane [name], was a long-term patient of mine as well. Her other Problems include diabetes, hypokalemia, hypertension and ongoing tobacco abuse. She's had progression of her bilateral carotid artery stenosis left greater than right . She is neurologically asymptomatic. She is high risk for carotid endarterectomy. I performed cerebral angiography and her 11/15/15 revealing 70% bilateral ICA stenosis with pins beyond the stenosis and significant calcification on the left making stenting not appropriate. Her carotid Dopplers performed on 06/26/16 showed stable bilateral moderate ICA stenosis and lower extremity Dopplers performed 12/12/16 revealed ABIs 0.93 bilaterally with moderate but not severe disease. She does complain of some leg pain which has some atypical characteristics for claudication  She does continue to smoke but is trying to stop.  She has ischemic cardiomyopathy with an EF of 20% range followed by Dr. Algernon Huxley.  Her last cath in 2016 by Dr. Martinique revealed occluded grafts with poor targets for redo CABG.  I did perform carotid stenting on her with Dr. Trula Slade 10/10/2017.  She has less tolerating claudication with ABIs in the 0.7-0.8 range bilaterally.  She has high-frequency signal in her proximal right SFA.  She apparently was turned down for LVAD therapy because her heart was too small.  She is compensated.  She wishes to proceed with right SFA  intervention for lifestyle limiting claudication.   Current Meds  Medication Sig  . acetaminophen (TYLENOL) 500 MG tablet Take 1,000 mg by mouth every 6 (six) hours as needed for moderate pain or headache.  . albuterol (VENTOLIN HFA) 108 (90 Base) MCG/ACT inhaler INHALER 2 PUFFS EVERY 6 HOURS AS NEEDED FOR WHEEZING/SHORTNESS OF BREATH  . aspirin EC 81 MG tablet Take 81 mg by mouth every evening.   . clopidogrel (PLAVIX) 75 MG tablet Take 1 tablet (75 mg total) by mouth daily.  [name] dexlansoprazole (DEXILANT) 60 MG capsule Take 60 mg by mouth daily.  . digoxin (LANOXIN) 0.125 MG tablet TAKE 0.5 TABLETS (0.0625 MG TOTAL) BY MOUTH DAILY.  . diphenhydrAMINE (BENADRYL) 25 mg capsule Take 25 mg by mouth 2 (two) times daily as needed for itching or allergies.  . fluticasone (FLONASE) 50 MCG/ACT nasal spray Place 2 sprays into both nostrils daily.  [name] gabapentin (NEURONTIN) 300 MG capsule Take 2 capsules (600 mg total) by mouth at bedtime.  [name] glimepiride (AMARYL) 4 MG tablet Take 4 mg by mouth in the morning and at bedtime.  . Glycopyrrolate-Formoterol (BEVESPI AEROSPHERE) 9-4.8 MCG/ACT AERO Inhale 2 puffs into the lungs 2 (two) times daily.  [name] guaiFENesin (MUCINEX) 600 MG 12 hr tablet Take 2 tablets (1,200 mg total) by mouth 2 (two) times daily.  [name] icosapent Ethyl (VASCEPA) 1 g capsule Take 2 capsules (2 g total) by mouth 2 (two) times daily.  . iron polysaccharides (NU-IRON) 150 MG capsule Take 1 capsule (150 mg total) by mouth 2 (two) times daily.  . isosorbide mononitrate (IMDUR) 30 MG 24 hr tablet Take 60 mg  by mouth daily.  [name] levETIRAcetam (KEPPRA) 1000 MG tablet Take 1 tablet (1,000 mg total) by mouth 2 (two) times daily.  . metFORMIN (GLUCOPHAGE-XR) 500 MG 24 hr tablet Take 500 mg by mouth every evening.   . metolazone (ZAROXOLYN) 2.5 MG tablet Take 1 tablet (2.5 mg total) by mouth once a week. On mondays  . nicotine (NICODERM CQ - DOSED IN MG/24 HOURS) 21 mg/24hr patch Place 21 mg onto the skin  daily.  . nicotine polacrilex (NICORETTE) 4 MG gum Take 1 each (4 mg total) by mouth as needed for smoking cessation.  . nitroGLYCERIN (NITROLINGUAL) 0.4 MG/SPRAY spray Place 1 spray under the tongue every 5 (five) minutes x 3 doses as needed for chest pain.  [name] nystatin (NYSTATIN) powder Apply 1 application topically 4 (four) times daily as needed (yeast infections).  . ONE TOUCH ULTRA TEST test strip USE AS DIRECTED FOR TESTING BLOOD GLUCOSE 3 TIMES DAILY  . polyethylene glycol (MIRALAX / GLYCOLAX) 17 g packet Take 17 g by mouth daily.  . potassium chloride SA (KLOR-CON) 20 MEQ tablet Take 1 tablet (20 mEq total) by mouth 2 (two) times daily.  . promethazine (PHENERGAN) 25 MG tablet Take 1 tablet (25 mg total) by mouth every 8 (eight) hours as needed for nausea or vomiting. Caution of sedation  . ranolazine (RANEXA) 1000 MG SR tablet Take 1 tablet (1,000 mg total) by mouth 2 (two) times daily.  . rosuvastatin (CRESTOR) 20 MG tablet Take 40 mg by mouth daily.  . sodium chloride (OCEAN) 0.65 % SOLN nasal spray Place 1 spray into both nostrils as needed for congestion.  [name] spironolactone (ALDACTONE) 25 MG tablet Take 1 tablet (25 mg total) by mouth at bedtime.  . torsemide (DEMADEX) 20 MG tablet Take 3 tablets (60 mg total) by mouth every morning AND 2 tablets (40 mg total) every evening.  Tyler Aas FLEXTOUCH 100 UNIT/ML FlexTouch Pen Inject 24 Units into the skin daily.      Allergies  Allergen Reactions  . Bee Venom Itching and Swelling  . Benadryl [Diphenhydramine] Other (See Comments)    Insomnia   . Entresto [Sacubitril-Valsartan] Cough  . Farxiga [Dapagliflozin]     Yeast infection  . Tetracycline     Unknown, pt cannot recall exact reaction  . Ace Inhibitors Cough    Social History   Socioeconomic History  . Marital status: Divorced    Spouse name: Not on file  . Number of children: 0  . Years of education: Not on file  . Highest education level: Not on file  Occupational  History  . Occupation: disabled  Tobacco Use  . Smoking status: Current Every Day Smoker    Packs/day: 1.00    Years: 51.00    Pack years: 51.00    Types: Cigarettes    Start date: 52    Last attempt to quit: 02/22/2020    Years since quitting: 0.1  . Smokeless tobacco: Never Used  Vaping Use  . Vaping Use: Former  Substance and Sexual Activity  . Alcohol use: No  . Drug use: No  . Sexual activity: Not Currently    Partners: Male    Birth control/protection: None  Other Topics Concern  . Not on file  Social History Narrative   Right handed   Lives alone in a one story home   Social Determinants of Health   Financial Resource Strain: Low Risk   . Difficulty of Paying Living Expenses: Not hard at all  Food Insecurity: No Food Insecurity  . Worried About Charity fundraiser in the Last Year: Never true  . Ran Out of Food in the Last Year: Never true  Transportation Needs: No Transportation Needs  . Lack of Transportation (Medical): No  . Lack of Transportation (Non-Medical): No  Physical Activity: Insufficiently Active  . Days of Exercise per Week: 2 days  . Minutes of Exercise per Session: 60 min  Stress: No Stress Concern Present  . Feeling of Stress : Not at all  Social Connections:   . Frequency of Communication with Friends and Family:   . Frequency of Social Gatherings with Friends and Family:   . Attends Religious Services:   . Active Member of Clubs or Organizations:   . Attends Archivist Meetings:   [name] Marital Status:   Intimate Partner Violence: Not At Risk  . Fear of Current or Ex-Partner: No  . Emotionally Abused: No  . Physically Abused: No  . Sexually Abused: No     Review of Systems: General: negative for chills, fever, night sweats or weight changes.  Cardiovascular: negative for chest pain, dyspnea on exertion, edema, orthopnea, palpitations, paroxysmal nocturnal dyspnea or shortness of breath Dermatological: negative for  rash Respiratory: negative for cough or wheezing Urologic: negative for hematuria Abdominal: negative for nausea, vomiting, diarrhea, bright red blood per rectum, melena, or hematemesis Neurologic: negative for visual changes, syncope, or dizziness All other systems reviewed and are otherwise negative except as noted above.    Blood pressure 106/64, pulse 94, height 4\' 10"  (1.473 m), weight 126 lb (57.2 kg), last menstrual period 09/02/2003, SpO2 95 %.  General appearance: alert and no distress Neck: no adenopathy, no carotid bruit, no JVD, supple, symmetrical, trachea midline and thyroid not enlarged, symmetric, no tenderness/mass/nodules Lungs: clear to auscultation bilaterally Heart: regular rate and rhythm, S1, S2 normal, no murmur, click, rub or gallop Extremities: extremities normal, atraumatic, no cyanosis or edema Pulses: 2+ and symmetric Skin: Skin color, texture, turgor normal. No rashes or lesions Neurologic: Alert and oriented X 3, normal strength and tone. Normal symmetric reflexes. Normal coordination and gait  EKG not performed today  ASSESSMENT AND PLAN:   Claudication in peripheral vascular disease [facility]) Ms. Crewe returns today for follow-up.  She is ready to pursue right SFA intervention for lifestyle limiting claudication.  She had Dopplers performed 01/26/2020 revealing a right ABI of 0.76 and a left of 0.86.  She had a fine high-frequency signal in her proximal right SFA.  We plan to proceed with angiography potential endovascular therapy for lifestyle limiting claudication.      Lorretta Harp MD FACP,FACC,FAHA, [facility] 03/30/2020 4:48 PM

## 2020-03-30 NOTE — Progress Notes (Signed)
[facility] OFFICE PROGRESS NOTE  Tower, [name], MD [place] [address]  DIAGNOSIS: Iron Deficiency Anemia  PRIOR THERAPY: None  CURRENT THERAPY: 1) Venofer infusions as needed. Last dose on 01/29/20  2) Oral iron supplement daily.  INTERVAL HISTORY: Diane [name] 63 y.o. female returns to the clinic today for a follow-up visit.  The patient receives Venofer as needed for iron deficiency anemia and her last treatment was given on 01/02/2020.  She also continues to take her oral iron supplement.  Patient did not notice any appreciable change in her fatigue after getting her iron infusions.  She also reports her dyspnea on exertion which is likely multifactorial secondary to anemia, CHF, COPD, etc.  She recently was established with the heart failure clinic.  She was also recently seen by her cardiologist for peripheral vascular disease she is planning to have a stent placed in the near future. She is also being seen for consideration of LVAD but it sounds like the patient was not deemed to be a candidate.   Otherwise today, the patient denies any chest pain, headaches, or dizziness.  Denies any abnormal bleeding except for easy bruising due to Plavix and aspirin use. She has some bruising on her upper extremities. Her last endoscopy was in spring 2020 and her last colonoscopy was in 2018 both of which were unremarkable.  She had FOBT testing which was negative. SShe denies any epistaxis, gingival bleeding, hypertension, hematuria, hemoptysis, or hematemesis.  She is here today for evaluation and a repeat CBC, ferritin, and iron studies.    MEDICAL HISTORY: Past Medical History:  Diagnosis Date  . Anemia   . Carotid stenosis    a. s/p right carotid stent 10/2017.  [name] Chronic systolic CHF (congestive heart failure) ([place])   . Colon polyps   . COPD (chronic obstructive pulmonary disease) ([place])   . Coronary artery disease    post CABG in 3/07 , coronary  stents   . Diabetes mellitus    type 2  . Dyslipidemia   . Fibromyalgia   . GERD (gastroesophageal reflux disease)   . Headache    hx of   . Hyperlipidemia   . Hypertension   . Myocardial infarction ([place])   . Pneumonia    hx of   . Seizures ([place])    last seizure- 03/2013   . Shortness of breath dyspnea    with exertion or when fluid builds up   . Sleep apnea    used to wear a cpap- not used in 3 years   . Status post dilation of esophageal narrowing   . Stroke [facility]) 1993   problems with balance   . Systolic murmur    known mild AS and MR  . Thyroid goiter   . Tobacco abuse     ALLERGIES:  is allergic to bee venom, benadryl [diphenhydramine], entresto [sacubitril-valsartan], farxiga [dapagliflozin], tetracycline, and ace inhibitors.  MEDICATIONS:  Current Outpatient Medications  Medication Sig Dispense Refill  . acetaminophen (TYLENOL) 500 MG tablet Take 1,000 mg by mouth every 6 (six) hours as needed for moderate pain or headache.    . albuterol (VENTOLIN HFA) 108 (90 Base) MCG/ACT inhaler INHALER 2 PUFFS EVERY 6 HOURS AS NEEDED FOR WHEEZING/SHORTNESS OF BREATH 18 g 1  . aspirin EC 81 MG tablet Take 81 mg by mouth every evening.  30 tablet 6  . clopidogrel (PLAVIX) 75 MG tablet Take 1 tablet (75 mg total) by  mouth daily. 90 tablet 3  . dexlansoprazole (DEXILANT) 60 MG capsule Take 60 mg by mouth daily.    . digoxin (LANOXIN) 0.125 MG tablet TAKE 0.5 TABLETS (0.0625 MG TOTAL) BY MOUTH DAILY. 45 tablet 3  . fluticasone (FLONASE) 50 MCG/ACT nasal spray Place 2 sprays into both nostrils daily. 48 g 1  . gabapentin (NEURONTIN) 300 MG capsule Take 2 capsules (600 mg total) by mouth at bedtime. 180 capsule 3  . glimepiride (AMARYL) 4 MG tablet Take 4 mg by mouth in the morning and at bedtime.    . Glycopyrrolate-Formoterol (BEVESPI AEROSPHERE) 9-4.8 MCG/ACT AERO Inhale 2 puffs into the lungs 2 (two) times daily. 32.1 g 1  . guaiFENesin (MUCINEX) 600 MG 12 hr tablet Take 2 tablets  (1,200 mg total) by mouth 2 (two) times daily. 360 tablet 3  . icosapent Ethyl (VASCEPA) 1 g capsule Take 2 capsules (2 g total) by mouth 2 (two) times daily. 120 capsule 3  . iron polysaccharides (NU-IRON) 150 MG capsule Take 1 capsule (150 mg total) by mouth 2 (two) times daily. 180 capsule 3  . isosorbide mononitrate (IMDUR) 30 MG 24 hr tablet Take 60 mg by mouth daily.    [name] levETIRAcetam (KEPPRA) 1000 MG tablet Take 1 tablet (1,000 mg total) by mouth 2 (two) times daily. 180 tablet 3  . metFORMIN (GLUCOPHAGE-XR) 500 MG 24 hr tablet Take 500 mg by mouth every evening.   6  . metolazone (ZAROXOLYN) 2.5 MG tablet Take 1 tablet (2.5 mg total) by mouth once a week. On mondays 10 tablet 4  . nicotine (NICODERM CQ - DOSED IN MG/24 HOURS) 21 mg/24hr patch Place 21 mg onto the skin daily.    . nicotine polacrilex (NICORETTE) 4 MG gum Take 1 each (4 mg total) by mouth as needed for smoking cessation. 90 tablet 2  . nitroGLYCERIN (NITROLINGUAL) 0.4 MG/SPRAY spray Place 1 spray under the tongue every 5 (five) minutes x 3 doses as needed for chest pain. 12 g 3  . nystatin (NYSTATIN) powder Apply 1 application topically 4 (four) times daily as needed (yeast infections). 45 g 1  . ONE TOUCH ULTRA TEST test strip USE AS DIRECTED FOR TESTING BLOOD GLUCOSE 3 TIMES DAILY  1  . polyethylene glycol (MIRALAX / GLYCOLAX) 17 g packet Take 17 g by mouth daily.    . potassium chloride SA (KLOR-CON) 20 MEQ tablet Take 1 tablet (20 mEq total) by mouth 2 (two) times daily.    . promethazine (PHENERGAN) 25 MG tablet Take 1 tablet (25 mg total) by mouth every 8 (eight) hours as needed for nausea or vomiting. Caution of sedation 30 tablet 1  . ranolazine (RANEXA) 1000 MG SR tablet Take 1 tablet (1,000 mg total) by mouth 2 (two) times daily. 180 tablet 3  . rosuvastatin (CRESTOR) 20 MG tablet Take 40 mg by mouth daily.    . sodium chloride (OCEAN) 0.65 % SOLN nasal spray Place 1 spray into both nostrils as needed for  congestion.    [name] spironolactone (ALDACTONE) 25 MG tablet Take 1 tablet (25 mg total) by mouth at bedtime. 90 tablet 3  . torsemide (DEMADEX) 20 MG tablet Take 3 tablets (60 mg total) by mouth every morning AND 2 tablets (40 mg total) every evening. 150 tablet 4  . TRESIBA FLEXTOUCH 100 UNIT/ML FlexTouch Pen Inject 30 Units into the skin daily.     . diphenhydrAMINE (BENADRYL) 25 mg capsule Take 25 mg by mouth 2 (two) times daily  as needed for itching or allergies. (Patient not taking: Reported on 03/31/2020)     Current Facility-Administered Medications  Medication Dose Route Frequency Provider Last Rate Last Admin  . sodium chloride flush (NS) 0.9 % injection 3 mL  3 mL Intravenous Q12H Lorretta Harp, MD        SURGICAL HISTORY:  Past Surgical History:  Procedure Laterality Date  . APPENDECTOMY    . BIOPSY  02/03/2019   Procedure: BIOPSY;  Surgeon: Ladene Artist, MD;  Location: [facility];  Service: Endoscopy;;  . BREAST BIOPSY Left 2016  . CARDIAC CATHETERIZATION  11/29/05   EF of 55%  . CARDIAC CATHETERIZATION  08/06/06   EF of 45-50%  . CARDIAC CATHETERIZATION N/A 05/06/2015   Procedure: Right/Left Heart Cath and Coronary/Graft Angiography;  Surgeon: Peter M Martinique, MD;  Location: [facility];  Service: Cardiovascular;  Laterality: N/A;  . CAROTID PTA/STENT INTERVENTION N/A 10/10/2017   Procedure: CAROTID PTA/STENT INTERVENTION - Right;  Surgeon: Serafina Mitchell, MD;  Location: [facility];  Service: Cardiovascular;  Laterality: N/A;  . CERVICAL FUSION  1990  . CHOLECYSTECTOMY    . COLON RESECTION     mass removed and 4 in of colon  . COLONOSCOPY WITH PROPOFOL N/A 07/16/2017   Procedure: COLONOSCOPY WITH PROPOFOL;  Surgeon: Ladene Artist, MD;  Location: [facility];  Service: Endoscopy;  Laterality: N/A;  . CORONARY ARTERY BYPASS GRAFT  12/04/2005   x5 -- left internal mammary artery to the LAD, left radial artery to the ramus intermedius, saphenous vein graft to  the obtuse marginal 1, sequential saphenous vein grat to the acute marginal and posterior descending, endoscopic vein harvesting from the left thigh with open vein harvest from right leg  . CORONARY STENT PLACEMENT  08/11/06   PCI of her ciurcumflex/OM vessel  . ESOPHAGOGASTRODUODENOSCOPY (EGD) WITH PROPOFOL N/A 02/03/2019   Procedure: ESOPHAGOGASTRODUODENOSCOPY (EGD) WITH PROPOFOL;  Surgeon: Ladene Artist, MD;  Location: [facility];  Service: Endoscopy;  Laterality: N/A;  . LAPAROTOMY Bilateral 05/19/2015   Procedure: EXPLORATORY LAPAROTOMY WITH BILATERAL SALPINGO OOPHORECTOMY /OMENTECTOMY/SEGMENTAL SIGMOID COLECTOMY ;  Surgeon: Everitt Amber, MD;  Location: [facility];  Service: Gynecology;  Laterality: Bilateral;  . PERIPHERAL VASCULAR CATHETERIZATION N/A 11/15/2015   Procedure: Carotid PTA/Stent Intervention;  Surgeon: Lorretta Harp, MD;  Location: [facility];  Service: Cardiovascular;  Laterality: N/A;  . PERIPHERAL VASCULAR CATHETERIZATION  11/15/2015   Procedure: Carotid Angiography;  Surgeon: Lorretta Harp, MD;  Location: [facility];  Service: Cardiovascular;;  . RIGHT HEART CATH N/A 01/27/2020   Procedure: RIGHT HEART CATH;  Surgeon: Larey Dresser, MD;  Location: [facility];  Service: Cardiovascular;  Laterality: N/A;    REVIEW OF SYSTEMS:   Review of Systems  Constitutional: Positive for fatigue. Negative for appetite change, chills, fever and unexpected weight change.  HENT:  Negative for mouth sores, nosebleeds, sore throat and trouble swallowing.   Eyes: Negative for eye problems and icterus.   and wheezing.  Cardiovascular: Negative for chest pain and leg swelling.  Gastrointestinal: Negative for abdominal pain, constipation, diarrhea, nausea and vomiting.  Genitourinary: Negative for bladder incontinence, difficulty urinating, dysuria, frequency and hematuria.   Musculoskeletal: Negative for back pain, gait problem, neck pain and neck stiffness.  Skin:  Negative for itching and rash.  Neurological: Negative for dizziness, extremity weakness, gait problem, headaches, light-headedness and seizures.  Hematological: Positive for easy bruising on extremities due to blood thinner.  Negative for adenopathy.  Psychiatric/Behavioral: Negative for confusion, depression and sleep disturbance. The patient is not nervous/anxious.      PHYSICAL EXAMINATION:  Blood pressure 105/66, pulse 88, temperature (!) 97.5 F (36.4 C), temperature source Temporal, resp. rate 18, height 4\' 10"  (1.473 m), weight 126 lb 3.2 oz (57.2 kg), last menstrual period 09/02/2003, SpO2 96 %.  ECOG PERFORMANCE STATUS: 1 - Symptomatic but completely ambulatory  Physical Exam  Constitutional: Oriented to person, place, and time and well-developed, well-nourished, and in no distress.  HENT:  Head: Normocephalic and atraumatic.  Mouth/Throat: Oropharynx is clear and moist. No oropharyngeal exudate.  Eyes: Conjunctivae are normal. Right eye exhibits no discharge. Left eye exhibits no discharge. No scleral icterus.  Neck: Normal range of motion. Neck supple.  Cardiovascular: Normal rate, regular rhythm, normal heart sounds and intact distal pulses.   Pulmonary/Chest: Effort normal and breath sounds normal. No respiratory distress. No wheezes. No rales.  Abdominal: Soft. Bowel sounds are normal. Exhibits no distension and no mass. There is no tenderness.  Musculoskeletal: Normal range of motion. Exhibits no edema.  Lymphadenopathy:    No cervical adenopathy.  Neurological: Alert and oriented to person, place, and time. Exhibits normal muscle tone. Gait normal. Coordination normal.  Skin: Bruising on her upper extremities bilaterally. Skin is warm and dry. No rash noted. Not diaphoretic. No erythema. No pallor.  Psychiatric: Mood, memory and judgment normal.  Vitals reviewed.  LABORATORY DATA: Lab Results  Component Value Date   WBC 8.2 03/31/2020   HGB 16.5 (H) 03/31/2020   HCT  46.9 (H) 03/31/2020   MCV 85.4 03/31/2020   PLT 227 03/31/2020      Chemistry      Component Value Date/Time   NA 132 (L) 03/31/2020 1358   NA 135 (L) 02/15/2017 1457   K 3.8 03/31/2020 1358   K 4.8 02/15/2017 1457   CL 88 (L) 03/31/2020 1358   CO2 31 03/31/2020 1358   CO2 26 02/15/2017 1457   BUN 33 (H) 03/31/2020 1358   BUN 10.8 02/15/2017 1457   CREATININE 0.87 03/31/2020 1358   CREATININE 0.69 05/06/2017 1405   CREATININE 0.7 02/15/2017 1457      Component Value Date/Time   CALCIUM 9.9 03/31/2020 1358   CALCIUM 9.3 02/15/2017 1457   ALKPHOS 87 03/31/2020 1358   ALKPHOS 75 02/15/2017 1457   AST 19 03/31/2020 1358   AST 9 02/15/2017 1457   ALT 23 03/31/2020 1358   ALT 13 02/15/2017 1457   BILITOT 0.4 03/31/2020 1358   BILITOT 0.28 02/15/2017 1457       RADIOGRAPHIC STUDIES:  DG Orthopantogram  Result Date: 03/08/2020 CLINICAL DATA:  Evaluation for LVAD, coronary artery disease post MI and CABG, ischemic cardiomyopathy, hypertension, COPD EXAM: ORTHOPANTOGRAM/PANORAMIC COMPARISON:  None FINDINGS: Multiple prior dental extractions. Scattered dental caries. No periodontal lucencies. IMPRESSION: Scattered dental caries. Electronically Signed   By: Lavonia Dana M.D.   On: 03/08/2020 10:11   DG Chest 2 View  Result Date: 03/08/2020 CLINICAL DATA:  LVAD evaluation. EXAM: CHEST - 2 VIEW COMPARISON:  12/25/2019 FINDINGS: Previous median sternotomy and CABG procedure. Mild cardiac enlargement. No pleural effusions. Pulmonary vascular congestion noted. No airspace opacities identified. IMPRESSION: Mild cardiac enlargement and pulmonary vascular congestion Electronically Signed   By: Kerby Moors M.D.   On: 03/08/2020 10:11   CT Chest W Contrast  Result Date: 03/07/2020 CLINICAL DATA:  Preop cardiac screening for LEFT ventricular assist device. EXAM: CT CHEST, ABDOMEN, AND PELVIS WITH  CONTRAST TECHNIQUE: Multidetector CT imaging of the chest, abdomen and pelvis was performed  following the standard protocol during bolus administration of intravenous contrast. CONTRAST:  59mL OMNIPAQUE IOHEXOL 300 MG/ML  SOLN COMPARISON:  02/11/2019 and CT of the chest of 07/10/2019 FINDINGS: CT CHEST FINDINGS Cardiovascular: Signs of median sternotomy for CABG. 3.7 cm ascending thoracic aorta. Moderate cardiac enlargement with extensive coronary artery calcification. No pericardial effusion. Central pulmonary vasculature is unremarkable. Limited venous phase assessment. Focal ulcerated plaque or chronic penetrating atherosclerotic ulcer of the descending thoracic aorta with similar contour the aorta when compared to the previous study. Mediastinum/Nodes: Small low-density lesion in the RIGHT hemi thyroid approximately 1 cm, not changed. No axillary lymphadenopathy.  No mediastinal lymphadenopathy. Lungs/Pleura: Multiple areas of centrilobular nodularity in the lung apices. Small cystic focus in the RIGHT upper lobe (image 32, series 5) 6.8 x 5.7 cm. Another similar but smaller approximately 4 mm area on image 26 of series 5 in the RIGHT upper lobe similarly areas in the LEFT upper lobe, for instance on image 23 of series 5 measuring approximately 4 mm. No consolidation. No pleural effusion. Musculoskeletal: No chest wall mass. CT ABDOMEN PELVIS FINDINGS Hepatobiliary: Lobular hepatic contours. Low-density hepatic parenchyma suggesting background steatosis without suspicious focal hepatic lesion. Post cholecystectomy with mild post cholecystectomy ductal dilation. Pancreas: Pancreas is normal. Spleen: Spleen normal size without focal lesion. Adrenals/Urinary Tract: Nodular adreniform thickening of the adrenal glands bilaterally. Renal contours are smooth. No suspicious renal lesion. No hydronephrosis. Symmetric renal enhancement. Normal appearance of the urinary bladder. Stomach/Bowel: No acute small bowel process. Post partial colonic resection. No bowel obstruction. Vascular/Lymphatic: 3.0 x 3.0 cm  infrarenal abdominal aortic dilation. Marked calcific and noncalcific atheromatous plaque of the abdominal aorta extending into the iliac vessels. No retroperitoneal or upper abdominal lymphadenopathy. Reproductive: No adnexal mass. Other: Thinning of the abdominal wall musculature with similar appearance. No frank hernia. Musculoskeletal: No acute bone finding. No destructive bone process. Signs of cervical spinal fusion of posterior elements partially imaged. Defects in the bilateral iliac bones may be related to prior surgical change and spinal fusion. IMPRESSION: 1. Nodularity at the lung apices with irregular thick-walled cystic cavities described above. Findings favor langerhans cell histiocytosis. Would also consider respiratory bronchiolitis. Consider follow-up low-dose high-resolution chest CT and correlation with symptoms and smoking history. 2. Focal ulcerated plaque or chronic penetrating atherosclerotic ulcer of the descending thoracic aorta with similar contour the aorta when compared to the previous study. 3. 3.0 x 3.0 cm infrarenal abdominal aortic dilation. Recommend followup by ultrasound in 3 years. This recommendation follows ACR consensus guidelines: White Paper of the ACR Incidental Findings Committee II on Vascular Findings. J Am Coll Radiol 2013; 51:025-852 4. Hepatic steatosis. 5. Nodular adreniform thickening of the adrenal glands bilaterally increased in terms of thickness compared to the prior study. Perhaps related to adrenal hyperplasia. Attention on follow-up. 6. Aortic atherosclerosis. Aortic Atherosclerosis (ICD10-I70.0). Electronically Signed   By: Zetta Bills M.D.   On: 03/07/2020 17:04   CT ABDOMEN PELVIS W CONTRAST  Result Date: 03/07/2020 CLINICAL DATA:  Preop cardiac screening for LEFT ventricular assist device. EXAM: CT CHEST, ABDOMEN, AND PELVIS WITH CONTRAST TECHNIQUE: Multidetector CT imaging of the chest, abdomen and pelvis was performed following the standard  protocol during bolus administration of intravenous contrast. CONTRAST:  45mL OMNIPAQUE IOHEXOL 300 MG/ML  SOLN COMPARISON:  02/11/2019 and CT of the chest of 07/10/2019 FINDINGS: CT CHEST FINDINGS Cardiovascular: Signs of median sternotomy for CABG. 3.7 cm ascending thoracic  aorta. Moderate cardiac enlargement with extensive coronary artery calcification. No pericardial effusion. Central pulmonary vasculature is unremarkable. Limited venous phase assessment. Focal ulcerated plaque or chronic penetrating atherosclerotic ulcer of the descending thoracic aorta with similar contour the aorta when compared to the previous study. Mediastinum/Nodes: Small low-density lesion in the RIGHT hemi thyroid approximately 1 cm, not changed. No axillary lymphadenopathy.  No mediastinal lymphadenopathy. Lungs/Pleura: Multiple areas of centrilobular nodularity in the lung apices. Small cystic focus in the RIGHT upper lobe (image 32, series 5) 6.8 x 5.7 cm. Another similar but smaller approximately 4 mm area on image 26 of series 5 in the RIGHT upper lobe similarly areas in the LEFT upper lobe, for instance on image 23 of series 5 measuring approximately 4 mm. No consolidation. No pleural effusion. Musculoskeletal: No chest wall mass. CT ABDOMEN PELVIS FINDINGS Hepatobiliary: Lobular hepatic contours. Low-density hepatic parenchyma suggesting background steatosis without suspicious focal hepatic lesion. Post cholecystectomy with mild post cholecystectomy ductal dilation. Pancreas: Pancreas is normal. Spleen: Spleen normal size without focal lesion. Adrenals/Urinary Tract: Nodular adreniform thickening of the adrenal glands bilaterally. Renal contours are smooth. No suspicious renal lesion. No hydronephrosis. Symmetric renal enhancement. Normal appearance of the urinary bladder. Stomach/Bowel: No acute small bowel process. Post partial colonic resection. No bowel obstruction. Vascular/Lymphatic: 3.0 x 3.0 cm infrarenal abdominal aortic  dilation. Marked calcific and noncalcific atheromatous plaque of the abdominal aorta extending into the iliac vessels. No retroperitoneal or upper abdominal lymphadenopathy. Reproductive: No adnexal mass. Other: Thinning of the abdominal wall musculature with similar appearance. No frank hernia. Musculoskeletal: No acute bone finding. No destructive bone process. Signs of cervical spinal fusion of posterior elements partially imaged. Defects in the bilateral iliac bones may be related to prior surgical change and spinal fusion. IMPRESSION: 1. Nodularity at the lung apices with irregular thick-walled cystic cavities described above. Findings favor langerhans cell histiocytosis. Would also consider respiratory bronchiolitis. Consider follow-up low-dose high-resolution chest CT and correlation with symptoms and smoking history. 2. Focal ulcerated plaque or chronic penetrating atherosclerotic ulcer of the descending thoracic aorta with similar contour the aorta when compared to the previous study. 3. 3.0 x 3.0 cm infrarenal abdominal aortic dilation. Recommend followup by ultrasound in 3 years. This recommendation follows ACR consensus guidelines: White Paper of the ACR Incidental Findings Committee II on Vascular Findings. J Am Coll Radiol 2013; 22:979-892 4. Hepatic steatosis. 5. Nodular adreniform thickening of the adrenal glands bilaterally increased in terms of thickness compared to the prior study. Perhaps related to adrenal hyperplasia. Attention on follow-up. 6. Aortic atherosclerosis. Aortic Atherosclerosis (ICD10-I70.0). Electronically Signed   By: Zetta Bills M.D.   On: 03/07/2020 17:04   VAS US CAROTID  Result Date: 03/07/2020 Carotid Arterial Duplex Study Indications:       Carotid artery disease, Right stent and LVAD evaluation. Comparison Study:  04/22/19 right stent 50-75% stenosis. left ICA 60-79% stenosis Performing Technologist: June Leap RDMS, RVT  Examination Guidelines: A complete evaluation  includes B-mode imaging, spectral Doppler, color Doppler, and power Doppler as needed of all accessible portions of each vessel. Bilateral testing is considered an integral part of a complete examination. Limited examinations for reoccurring indications may be performed as noted.  Right Carotid Findings: +----------+--------+--------+--------+------------------+--------+           PSV cm/sEDV cm/sStenosisPlaque DescriptionComments +----------+--------+--------+--------+------------------+--------+ CCA Prox  69      14                                         +----------+--------+--------+--------+------------------+--------+  CCA Distal68      17                                         +----------+--------+--------+--------+------------------+--------+ ICA Distal57      19                                         +----------+--------+--------+--------+------------------+--------+ ECA       165     12      >50%                               +----------+--------+--------+--------+------------------+--------+ +----------+--------+-------+----------------+-------------------+           PSV cm/sEDV cmsDescribe        Arm Pressure (mmHG) +----------+--------+-------+----------------+-------------------+ [id number]            Multiphasic, WNL                    +----------+--------+-------+----------------+-------------------+ +---------+--------+--+--------+--+---------+ VertebralPSV cm/s71EDV cm/s17Antegrade +---------+--------+--+--------+--+---------+  Right Stent(s): +---------------+---+--+++-------------------+ Prox to Stent  68 17                    +---------------+---+--+++-------------------+ Proximal Stent 12235borderline elevated +---------------+---+--+++-------------------+ Mid Stent      99 29                    +---------------+---+--+++-------------------+ Distal Stent   84 22                     +---------------+---+--+++-------------------+ Distal to Stent57 19                    +---------------+---+--+++-------------------+   Left Carotid Findings: +----------+--------+--------+--------+-------------------------+--------+           PSV cm/sEDV cm/sStenosisPlaque Description       Comments +----------+--------+--------+--------+-------------------------+--------+ CCA Prox  63      17                                                +----------+--------+--------+--------+-------------------------+--------+ CCA Distal64      15                                                +----------+--------+--------+--------+-------------------------+--------+ ICA Prox  243     73      60-79%  heterogenous and calcifictortuous +----------+--------+--------+--------+-------------------------+--------+ ICA Mid   187     40                                       tortuous +----------+--------+--------+--------+-------------------------+--------+ ICA Distal57      20                                                +----------+--------+--------+--------+-------------------------+--------+ ECA  220     19      >50%                                      +----------+--------+--------+--------+-------------------------+--------+ +----------+--------+--------+----------------+-------------------+           PSV cm/sEDV cm/sDescribe        Arm Pressure (mmHG) +----------+--------+--------+----------------+-------------------+ Subclavian120             Multiphasic, WNL                    +----------+--------+--------+----------------+-------------------+ +---------+--------+--+--------+-+---------+ VertebralPSV cm/s53EDV cm/s9Antegrade +---------+--------+--+--------+-+---------+   Summary: Right Carotid: The ECA appears >50% stenosed. Right ICA stent <50% stenosis. Left Carotid: Velocities in the left ICA are consistent with a 60-79% stenosis.                The ECA appears >50% stenosed.  *See table(s) above for measurements and observations.  Electronically signed by Curt Jews MD on 03/07/2020 at 6:59:02 PM.    Final      ASSESSMENT/PLAN:  This is a very pleasant 64 year old Caucasian female with iron deficiency anemia.   She had a repeat CBC, iron studies, and ferritin. Her CBC showed  an elevated Hbg at 16.5. Of note, the patient also has cardiopulmonary disease. Iron studies note a total iron WNL at 77, TIBC at 498, and saturation at 15%. Her ferritin is still pending.   Discussed with Dr. Julien Nordmann. We will not arrange for any iron infusions at this time.   The patient was advised to continue to take her oral iron supplement.   We will see her back for a follow up visit in 3 months for evaluation and repeat cbc, iron studies, and ferritin.  The patient was advised to call immediately if she has any concerning symptoms in the interval. The patient voices understanding of current disease status and treatment options and is in agreement with the current care plan. All questions were answered. The patient knows to call the clinic with any problems, questions or concerns. We can certainly see the patient much sooner if necessary     Orders Placed This Encounter  Procedures  . CBC with Differential (Cancer Center Only)    Standing Status:   Future    Standing Expiration Date:   03/31/2021  . CMP ([place] only)    Standing Status:   Future    Standing Expiration Date:   03/31/2021  . Ferritin    Standing Status:   Future    Standing Expiration Date:   03/31/2021  . Iron and TIBC    Standing Status:   Future    Standing Expiration Date:   03/31/2021     Diane Scallon L Angelys Yetman, PA-C 03/31/20

## 2020-03-30 NOTE — Patient Instructions (Signed)
    [address] [place] [address] Dept: [phone] Loc: [place]  03/30/2020  You are scheduled for a Peripheral Angiogram on Monday, July 19 with Dr. Quay Burow.  1. Please arrive at the [facility] (Main Entrance A) at [facility]: [address] [place], [place] [address] at 5:30 AM (This time is two hours before your procedure to ensure your preparation). Free valet parking service is available.   Special note: Every effort is made to have your procedure done on time. Please understand that emergencies sometimes delay scheduled procedures.  2. Diet: Do not eat solid foods after midnight.  The patient may have clear liquids until 5am upon the day of the procedure.  3. Labs: You will need to have blood drawn on THE WEEK OF 04-11-20  4. Medication instructions in preparation for your procedure:  DO NOT TAKE AMARYL THE MORNING OF THE PROCEDURE  DO NOT TAKE METFORMIN THE 19TH, 20TH, OR 21ST, RESTART ON THE 22ND.  TAKE 1/2E OF INSULIN THE NIGHT BEFORE THE PROCEDURE AND NONE THE MORNING OF THE PROCEDURE  On the morning of your procedure, take your Plavix/Clopidogrel and any morning medicines NOT listed above.  You may use sips of water.  5. Plan for one night stay--bring personal belongings. 6. Bring a current list of your medications and current insurance cards. 7. You MUST have a responsible person to drive you home. 8. Someone MUST be with you the first 24 hours after you arrive home or your discharge will be delayed. 9. Please wear clothes that are easy to get on and off and wear slip-on shoes.  Thank you for allowing [place] to care for you!   -- [facility]   Your physician has requested that you have a lower extremity arterial duplex. This test is an ultrasound of the arteries in the legs or arms. It looks at  arterial blood flow in the legs and arms. Allow one hour for Lower and Upper Arterial scans. There are no restrictions or special instructions 1 WEEK AFTER PROCEDURE  Your physician recommends that you schedule a follow-up appointment in: 2 [place]

## 2020-03-31 ENCOUNTER — Other Ambulatory Visit: Payer: Self-pay

## 2020-03-31 ENCOUNTER — Telehealth: Payer: Self-pay | Admitting: Physician Assistant

## 2020-03-31 ENCOUNTER — Inpatient Hospital Stay: Payer: Medicare Other | Attending: Physician Assistant

## 2020-03-31 ENCOUNTER — Inpatient Hospital Stay (HOSPITAL_BASED_OUTPATIENT_CLINIC_OR_DEPARTMENT_OTHER): Payer: Medicare Other | Admitting: Physician Assistant

## 2020-03-31 ENCOUNTER — Other Ambulatory Visit: Payer: Medicare Other

## 2020-03-31 ENCOUNTER — Ambulatory Visit: Payer: Medicare Other | Admitting: Internal Medicine

## 2020-03-31 VITALS — BP 105/66 | HR 88 | Temp 97.5°F | Resp 18 | Ht <= 58 in | Wt 126.2 lb

## 2020-03-31 DIAGNOSIS — Z794 Long term (current) use of insulin: Secondary | ICD-10-CM | POA: Diagnosis not present

## 2020-03-31 DIAGNOSIS — D509 Iron deficiency anemia, unspecified: Secondary | ICD-10-CM | POA: Insufficient documentation

## 2020-03-31 DIAGNOSIS — I2510 Atherosclerotic heart disease of native coronary artery without angina pectoris: Secondary | ICD-10-CM | POA: Diagnosis not present

## 2020-03-31 DIAGNOSIS — Z79899 Other long term (current) drug therapy: Secondary | ICD-10-CM | POA: Diagnosis not present

## 2020-03-31 DIAGNOSIS — E785 Hyperlipidemia, unspecified: Secondary | ICD-10-CM | POA: Insufficient documentation

## 2020-03-31 DIAGNOSIS — Z7982 Long term (current) use of aspirin: Secondary | ICD-10-CM | POA: Diagnosis not present

## 2020-03-31 DIAGNOSIS — J449 Chronic obstructive pulmonary disease, unspecified: Secondary | ICD-10-CM | POA: Insufficient documentation

## 2020-03-31 DIAGNOSIS — I255 Ischemic cardiomyopathy: Secondary | ICD-10-CM

## 2020-03-31 DIAGNOSIS — K219 Gastro-esophageal reflux disease without esophagitis: Secondary | ICD-10-CM | POA: Diagnosis not present

## 2020-03-31 DIAGNOSIS — I252 Old myocardial infarction: Secondary | ICD-10-CM | POA: Insufficient documentation

## 2020-03-31 DIAGNOSIS — E119 Type 2 diabetes mellitus without complications: Secondary | ICD-10-CM | POA: Diagnosis not present

## 2020-03-31 DIAGNOSIS — I5022 Chronic systolic (congestive) heart failure: Secondary | ICD-10-CM | POA: Insufficient documentation

## 2020-03-31 DIAGNOSIS — G4730 Sleep apnea, unspecified: Secondary | ICD-10-CM | POA: Diagnosis not present

## 2020-03-31 DIAGNOSIS — Z8673 Personal history of transient ischemic attack (TIA), and cerebral infarction without residual deficits: Secondary | ICD-10-CM | POA: Insufficient documentation

## 2020-03-31 DIAGNOSIS — I110 Hypertensive heart disease with heart failure: Secondary | ICD-10-CM | POA: Diagnosis not present

## 2020-03-31 LAB — CBC WITH DIFFERENTIAL (CANCER CENTER ONLY)
Abs Immature Granulocytes: 0.02 10*3/uL (ref 0.00–0.07)
Basophils Absolute: 0 10*3/uL (ref 0.0–0.1)
Basophils Relative: 0 %
Eosinophils Absolute: 0.1 10*3/uL (ref 0.0–0.5)
Eosinophils Relative: 1 %
HCT: 46.9 % — ABNORMAL HIGH (ref 36.0–46.0)
Hemoglobin: 16.5 g/dL — ABNORMAL HIGH (ref 12.0–15.0)
Immature Granulocytes: 0 %
Lymphocytes Relative: 23 %
Lymphs Abs: 1.9 10*3/uL (ref 0.7–4.0)
MCH: 30.1 pg (ref 26.0–34.0)
MCHC: 35.2 g/dL (ref 30.0–36.0)
MCV: 85.4 fL (ref 80.0–100.0)
Monocytes Absolute: 0.7 10*3/uL (ref 0.1–1.0)
Monocytes Relative: 9 %
Neutro Abs: 5.4 10*3/uL (ref 1.7–7.7)
Neutrophils Relative %: 67 %
Platelet Count: 227 10*3/uL (ref 150–400)
RBC: 5.49 MIL/uL — ABNORMAL HIGH (ref 3.87–5.11)
RDW: 15.6 % — ABNORMAL HIGH (ref 11.5–15.5)
WBC Count: 8.2 10*3/uL (ref 4.0–10.5)
nRBC: 0 % (ref 0.0–0.2)

## 2020-03-31 LAB — CMP (CANCER CENTER ONLY)
ALT: 23 U/L (ref 0–44)
AST: 19 U/L (ref 15–41)
Albumin: 3.8 g/dL (ref 3.5–5.0)
Alkaline Phosphatase: 87 U/L (ref 38–126)
Anion gap: 13 (ref 5–15)
BUN: 33 mg/dL — ABNORMAL HIGH (ref 8–23)
CO2: 31 mmol/L (ref 22–32)
Calcium: 9.9 mg/dL (ref 8.9–10.3)
Chloride: 88 mmol/L — ABNORMAL LOW (ref 98–111)
Creatinine: 0.87 mg/dL (ref 0.44–1.00)
GFR, Est AFR Am: >60 mL/min (ref >60)
GFR, Estimated: >60 mL/min (ref >60)
Glucose, Bld: 195 mg/dL — ABNORMAL HIGH (ref 70–99)
Potassium: 3.8 mmol/L (ref 3.5–5.1)
Sodium: 132 mmol/L — ABNORMAL LOW (ref 135–145)
Total Bilirubin: 0.4 mg/dL (ref 0.3–1.2)
Total Protein: 7.4 g/dL (ref 6.5–8.1)

## 2020-03-31 LAB — IRON AND TIBC
Iron: 77 ug/dL (ref 41–142)
Saturation Ratios: 15 % — ABNORMAL LOW (ref 21–57)
TIBC: 498 ug/dL — ABNORMAL HIGH (ref 236–444)
UIBC: 421 ug/dL — ABNORMAL HIGH (ref 120–384)

## 2020-03-31 LAB — FERRITIN: Ferritin: 50 ng/mL (ref 11–307)

## 2020-03-31 NOTE — Telephone Encounter (Signed)
Scheduled per 7/1 los. Printed avs and calendar for pt

## 2020-04-05 ENCOUNTER — Ambulatory Visit (HOSPITAL_COMMUNITY)
Admission: RE | Admit: 2020-04-05 | Discharge: 2020-04-05 | Disposition: A | Discharge status: Home / Self Care | Payer: Medicare Other | Source: Ambulatory Visit | Attending: Cardiology | Admitting: Cardiology

## 2020-04-05 ENCOUNTER — Other Ambulatory Visit: Payer: Self-pay

## 2020-04-05 DIAGNOSIS — E7800 Pure hypercholesterolemia, unspecified: Secondary | ICD-10-CM | POA: Diagnosis not present

## 2020-04-05 DIAGNOSIS — I429 Cardiomyopathy, unspecified: Secondary | ICD-10-CM | POA: Insufficient documentation

## 2020-04-05 LAB — HEPATIC FUNCTION PANEL
ALT: 24 U/L (ref 0–44)
AST: 23 U/L (ref 15–41)
Albumin: 3.7 g/dL (ref 3.5–5.0)
Alkaline Phosphatase: 74 U/L (ref 38–126)
Bilirubin, Direct: <0.1 mg/dL (ref 0.0–0.2)
Total Bilirubin: 0.7 mg/dL (ref 0.3–1.2)
Total Protein: 6.9 g/dL (ref 6.5–8.1)

## 2020-04-05 LAB — BASIC METABOLIC PANEL
Anion gap: 13 (ref 5–15)
BUN: 25 mg/dL — ABNORMAL HIGH (ref 8–23)
CO2: 29 mmol/L (ref 22–32)
Calcium: 9.6 mg/dL (ref 8.9–10.3)
Chloride: 87 mmol/L — ABNORMAL LOW (ref 98–111)
Creatinine, Ser: 0.73 mg/dL (ref 0.44–1.00)
GFR calc Af Amer: >60 mL/min (ref >60)
GFR calc non Af Amer: >60 mL/min (ref >60)
Glucose, Bld: 253 mg/dL — ABNORMAL HIGH (ref 70–99)
Potassium: 3.2 mmol/L — ABNORMAL LOW (ref 3.5–5.1)
Sodium: 129 mmol/L — ABNORMAL LOW (ref 135–145)

## 2020-04-12 ENCOUNTER — Telehealth: Payer: Self-pay | Admitting: Cardiovascular Disease

## 2020-04-12 ENCOUNTER — Other Ambulatory Visit: Payer: Self-pay | Admitting: Cardiovascular Disease

## 2020-04-12 DIAGNOSIS — I739 Peripheral vascular disease, unspecified: Secondary | ICD-10-CM

## 2020-04-12 NOTE — Telephone Encounter (Signed)
Called and spoke with pt, notified we no longer require Covid testing for certain procedures and this includes her peripheral angiogram on Monday. Pt states that she had to have one for a CT and just wanted to make sure she didn't need it now. Reiterated that she did not need the covid test for her procedure. Pt verbalized understanding with no other questions at this time.

## 2020-04-12 NOTE — Telephone Encounter (Signed)
New message   Patient want to know if she needs a covid test before her cath lab procedure on Monday. Please advise

## 2020-04-13 ENCOUNTER — Telehealth: Payer: Self-pay | Admitting: Cardiology

## 2020-04-13 NOTE — Telephone Encounter (Signed)
Already spoke to patient about recent lab results.

## 2020-04-13 NOTE — Telephone Encounter (Signed)
Patient returning call for lab results. 

## 2020-04-14 ENCOUNTER — Telehealth: Payer: Self-pay | Admitting: *Deleted

## 2020-04-14 DIAGNOSIS — I739 Peripheral vascular disease, unspecified: Secondary | ICD-10-CM | POA: Diagnosis not present

## 2020-04-14 DIAGNOSIS — I25708 Atherosclerosis of coronary artery bypass graft(s), unspecified, with other forms of angina pectoris: Secondary | ICD-10-CM | POA: Diagnosis not present

## 2020-04-14 NOTE — Telephone Encounter (Addendum)
Pt contacted pre-abdominal aortogram  scheduled at [facility] for: Monday April 18, 2020 7:30 AM Verified arrival time and place: [facility]) at: 5:30 AM   No solid food after midnight prior to cath, clear liquids until 5 AM day of procedure.  Hold: Glimepiride-AM of procedure Metformin-day of procedure and 48 hours post procedure. Treshiba-1/2 usual dose PM prior to procedure-pt states she does not take in the AM Torsemide-AM of procedure Metolazone-AM of procedure  Except hold medications AM meds can be  taken pre-cath with sips of water including: ASA 81 mg Plavix 75 mg  Confirmed patient has responsible adult to drive home post procedure and observe 24 hours after arriving home: yes  You are allowed ONE visitor in the waiting room during your procedure. Both you and your visitor must wear a mask once you enter the hospital.      COVID-19 Pre-Screening Questions:  . In the past 14 days have you had a new cough, shortness of breath, headache, congestion, fever (100 or greater) unexplained body aches, new sore throat, or sudden loss of taste or sense of smell? no . In the past 14 days have you been around anyone with known Covid 19? no   Reviewed procedure/mask/visitor instructions, COVID-19 screening questions with patient.  Pt states she is fully vaccinated for COVID-19, see immunization history.  Pt states she is going to [facility] for pre-procedure BMP/CBC today.

## 2020-04-14 NOTE — Telephone Encounter (Signed)
Patient returning call.

## 2020-04-15 ENCOUNTER — Other Ambulatory Visit: Payer: Self-pay | Admitting: Family Medicine

## 2020-04-15 LAB — CBC
Hematocrit: 46.1 % (ref 34.0–46.6)
Hemoglobin: 16.9 g/dL — ABNORMAL HIGH (ref 11.1–15.9)
MCH: 31.2 pg (ref 26.6–33.0)
MCHC: 36.7 g/dL — ABNORMAL HIGH (ref 31.5–35.7)
MCV: 85 fL (ref 79–97)
Platelets: 257 10*3/uL (ref 150–450)
RBC: 5.42 x10E6/uL — ABNORMAL HIGH (ref 3.77–5.28)
RDW: 14.5 % (ref 11.7–15.4)
WBC: 8.8 10*3/uL (ref 3.4–10.8)

## 2020-04-15 LAB — BASIC METABOLIC PANEL
BUN/Creatinine Ratio: 22 (ref 12–28)
BUN: 17 mg/dL (ref 8–27)
CO2: 28 mmol/L (ref 20–29)
Calcium: 10.4 mg/dL — ABNORMAL HIGH (ref 8.7–10.3)
Chloride: 90 mmol/L — ABNORMAL LOW (ref 96–106)
Creatinine, Ser: 0.76 mg/dL (ref 0.57–1.00)
GFR calc Af Amer: 95 mL/min/{1.73_m2} (ref >59)
GFR calc non Af Amer: 82 mL/min/{1.73_m2} (ref >59)
Glucose: 220 mg/dL — ABNORMAL HIGH (ref 65–99)
Potassium: 5 mmol/L (ref 3.5–5.2)
Sodium: 135 mmol/L (ref 134–144)

## 2020-04-18 ENCOUNTER — Ambulatory Visit (HOSPITAL_COMMUNITY)
Admission: RE | Admit: 2020-04-18 | Discharge: 2020-04-18 | Disposition: A | Discharge status: Home / Self Care | Payer: Medicare Other | Attending: Cardiovascular Disease | Admitting: Cardiovascular Disease

## 2020-04-18 ENCOUNTER — Encounter (HOSPITAL_COMMUNITY): Payer: Self-pay | Admitting: Cardiovascular Disease

## 2020-04-18 ENCOUNTER — Other Ambulatory Visit: Payer: Self-pay

## 2020-04-18 ENCOUNTER — Other Ambulatory Visit: Payer: Self-pay | Admitting: Neurology

## 2020-04-18 ENCOUNTER — Encounter (HOSPITAL_COMMUNITY)
Admission: RE | Disposition: A | Discharge status: Home / Self Care | Payer: Medicare Other | Source: Home / Self Care | Attending: Cardiovascular Disease

## 2020-04-18 DIAGNOSIS — Z7902 Long term (current) use of antithrombotics/antiplatelets: Secondary | ICD-10-CM | POA: Diagnosis not present

## 2020-04-18 DIAGNOSIS — E1151 Type 2 diabetes mellitus with diabetic peripheral angiopathy without gangrene: Secondary | ICD-10-CM | POA: Insufficient documentation

## 2020-04-18 DIAGNOSIS — Z7982 Long term (current) use of aspirin: Secondary | ICD-10-CM | POA: Insufficient documentation

## 2020-04-18 DIAGNOSIS — I70211 Atherosclerosis of native arteries of extremities with intermittent claudication, right leg: Secondary | ICD-10-CM | POA: Diagnosis not present

## 2020-04-18 DIAGNOSIS — Z794 Long term (current) use of insulin: Secondary | ICD-10-CM | POA: Diagnosis not present

## 2020-04-18 DIAGNOSIS — F17210 Nicotine dependence, cigarettes, uncomplicated: Secondary | ICD-10-CM | POA: Insufficient documentation

## 2020-04-18 DIAGNOSIS — I25708 Atherosclerosis of coronary artery bypass graft(s), unspecified, with other forms of angina pectoris: Secondary | ICD-10-CM

## 2020-04-18 DIAGNOSIS — Z888 Allergy status to other drugs, medicaments and biological substances status: Secondary | ICD-10-CM | POA: Diagnosis not present

## 2020-04-18 DIAGNOSIS — I2510 Atherosclerotic heart disease of native coronary artery without angina pectoris: Secondary | ICD-10-CM | POA: Diagnosis not present

## 2020-04-18 DIAGNOSIS — I7090 Unspecified atherosclerosis: Secondary | ICD-10-CM | POA: Diagnosis present

## 2020-04-18 DIAGNOSIS — I272 Pulmonary hypertension, unspecified: Secondary | ICD-10-CM | POA: Insufficient documentation

## 2020-04-18 DIAGNOSIS — I255 Ischemic cardiomyopathy: Secondary | ICD-10-CM | POA: Insufficient documentation

## 2020-04-18 DIAGNOSIS — Z951 Presence of aortocoronary bypass graft: Secondary | ICD-10-CM | POA: Diagnosis not present

## 2020-04-18 DIAGNOSIS — I739 Peripheral vascular disease, unspecified: Secondary | ICD-10-CM

## 2020-04-18 DIAGNOSIS — E876 Hypokalemia: Secondary | ICD-10-CM | POA: Diagnosis not present

## 2020-04-18 DIAGNOSIS — I10 Essential (primary) hypertension: Secondary | ICD-10-CM | POA: Diagnosis not present

## 2020-04-18 DIAGNOSIS — Z881 Allergy status to other antibiotic agents status: Secondary | ICD-10-CM | POA: Diagnosis not present

## 2020-04-18 DIAGNOSIS — I6523 Occlusion and stenosis of bilateral carotid arteries: Secondary | ICD-10-CM | POA: Insufficient documentation

## 2020-04-18 DIAGNOSIS — Z79899 Other long term (current) drug therapy: Secondary | ICD-10-CM | POA: Insufficient documentation

## 2020-04-18 HISTORY — PX: PERIPHERAL VASCULAR BALLOON ANGIOPLASTY: CATH118281

## 2020-04-18 HISTORY — PX: ABDOMINAL AORTOGRAM W/LOWER EXTREMITY: CATH118223

## 2020-04-18 LAB — GLUCOSE, CAPILLARY: Glucose-Capillary: 106 mg/dL — ABNORMAL HIGH (ref 70–99)

## 2020-04-18 LAB — POCT ACTIVATED CLOTTING TIME
Activated Clotting Time: 313 seconds
Activated Clotting Time: 224 seconds

## 2020-04-18 SURGERY — ABDOMINAL AORTOGRAM W/LOWER EXTREMITY
Anesthesia: LOCAL

## 2020-04-18 MED ORDER — ASPIRIN 81 MG PO CHEW
81.0000 mg | CHEWABLE_TABLET | ORAL | Status: AC
  Administered 2020-04-18: 81 mg via ORAL
  Filled 2020-04-18: qty 1

## 2020-04-18 MED ORDER — FENTANYL CITRATE (PF) 100 MCG/2ML IJ SOLN
INTRAMUSCULAR | Status: AC
  Filled 2020-04-18: qty 2

## 2020-04-18 MED ORDER — LIDOCAINE HCL (PF) 1 % IJ SOLN
INTRAMUSCULAR | Status: AC
  Filled 2020-04-18: qty 30

## 2020-04-18 MED ORDER — FENTANYL CITRATE (PF) 100 MCG/2ML IJ SOLN
INTRAMUSCULAR | Status: DC | PRN
  Administered 2020-04-18: 25 ug via INTRAVENOUS

## 2020-04-18 MED ORDER — LIDOCAINE HCL (PF) 1 % IJ SOLN
INTRAMUSCULAR | Status: DC | PRN
  Administered 2020-04-18: 16 mL via INTRADERMAL

## 2020-04-18 MED ORDER — SODIUM CHLORIDE 0.9 % IV SOLN: INTRAVENOUS | Status: DC

## 2020-04-18 MED ORDER — MIDAZOLAM HCL 2 MG/2ML IJ SOLN
INTRAMUSCULAR | Status: DC | PRN
  Administered 2020-04-18: 1 mg via INTRAVENOUS

## 2020-04-18 MED ORDER — HYDRALAZINE HCL 20 MG/ML IJ SOLN: 5.0000 mg | INTRAMUSCULAR | Status: DC | PRN

## 2020-04-18 MED ORDER — MIDAZOLAM HCL 2 MG/2ML IJ SOLN
INTRAMUSCULAR | Status: AC
  Filled 2020-04-18: qty 2

## 2020-04-18 MED ORDER — MORPHINE SULFATE (PF) 2 MG/ML IV SOLN: 2.0000 mg | INTRAVENOUS | Status: DC | PRN

## 2020-04-18 MED ORDER — SODIUM CHLORIDE 0.9 % WEIGHT BASED INFUSION
3.0000 mL/kg/h | INTRAVENOUS | Status: AC
  Administered 2020-04-18: 3 mL/kg/h via INTRAVENOUS

## 2020-04-18 MED ORDER — SODIUM CHLORIDE 0.9% FLUSH: 3.0000 mL | INTRAVENOUS | Status: DC | PRN

## 2020-04-18 MED ORDER — SODIUM CHLORIDE 0.9 % IV SOLN: 250.0000 mL | INTRAVENOUS | Status: DC | PRN

## 2020-04-18 MED ORDER — HEPARIN SODIUM (PORCINE) 1000 UNIT/ML IJ SOLN
INTRAMUSCULAR | Status: DC | PRN
  Administered 2020-04-18: 7000 [IU] via INTRAVENOUS

## 2020-04-18 MED ORDER — HEPARIN (PORCINE) IN NACL 1000-0.9 UT/500ML-% IV SOLN
INTRAVENOUS | Status: AC
  Filled 2020-04-18: qty 1000

## 2020-04-18 MED ORDER — SODIUM CHLORIDE 0.9% FLUSH: 3.0000 mL | Freq: Two times a day (BID) | INTRAVENOUS | Status: DC

## 2020-04-18 MED ORDER — ACETAMINOPHEN 325 MG PO TABS: 650.0000 mg | ORAL_TABLET | ORAL | Status: DC | PRN

## 2020-04-18 MED ORDER — LABETALOL HCL 5 MG/ML IV SOLN: 10.0000 mg | INTRAVENOUS | Status: DC | PRN

## 2020-04-18 MED ORDER — HEPARIN SODIUM (PORCINE) 1000 UNIT/ML IJ SOLN
INTRAMUSCULAR | Status: AC
  Filled 2020-04-18: qty 1

## 2020-04-18 MED ORDER — CLOPIDOGREL BISULFATE 75 MG PO TABS: 75.0000 mg | ORAL_TABLET | Freq: Every day | ORAL | Status: DC

## 2020-04-18 MED ORDER — ONDANSETRON HCL 4 MG/2ML IJ SOLN: 4.0000 mg | Freq: Four times a day (QID) | INTRAMUSCULAR | Status: DC | PRN

## 2020-04-18 MED ORDER — IODIXANOL 320 MG/ML IV SOLN
INTRAVENOUS | Status: DC | PRN
  Administered 2020-04-18: 217 mL via INTRA_ARTERIAL

## 2020-04-18 MED ORDER — SODIUM CHLORIDE 0.9% FLUSH
3.0000 mL | INTRAVENOUS | Status: DC | PRN
Start: 2020-04-18 — End: 2020-04-18

## 2020-04-18 MED ORDER — HEPARIN (PORCINE) IN NACL 1000-0.9 UT/500ML-% IV SOLN
INTRAVENOUS | Status: DC | PRN
  Administered 2020-04-18 (×2): 500 mL

## 2020-04-18 MED ORDER — ATORVASTATIN CALCIUM 80 MG PO TABS: 80.0000 mg | ORAL_TABLET | Freq: Every day | ORAL | Status: DC

## 2020-04-18 MED ORDER — SODIUM CHLORIDE 0.9 % WEIGHT BASED INFUSION: 1.0000 mL/kg/h | INTRAVENOUS | Status: DC

## 2020-04-18 MED ORDER — ASPIRIN EC 81 MG PO TBEC: 81.0000 mg | DELAYED_RELEASE_TABLET | Freq: Every day | ORAL | Status: DC

## 2020-04-18 SURGICAL SUPPLY — 27 items
BALLN CHOCOLATE 4.0X40X135 (BALLOONS) ×3
BALLN COYOTE OTW 2.5X60X150 (BALLOONS) ×3
BALLN IN.PACT DCB 4X150 (BALLOONS) ×3
BALLN IN.PACT DCB 4X60 (BALLOONS) ×3
BALLOON CHOCOLATE 4.0X40X135 (BALLOONS) IMPLANT
BALLOON COYOTE OTW 2.5X60X150 (BALLOONS) IMPLANT
CATH ANGIO 5F PIGTAIL 65CM (CATHETERS) ×1 IMPLANT
CATH CROSS OVER TEMPO 5F (CATHETERS) ×1 IMPLANT
CATH HAWKONE LX EXTENDED TIP (CATHETERS) ×1 IMPLANT
DCB IN.PACT 4X150 (BALLOONS) IMPLANT
DCB IN.PACT 4X60 (BALLOONS) IMPLANT
DEVICE CLOSURE PERCLS PRGLD 6F (VASCULAR PRODUCTS) IMPLANT
DEVICE SPIDERFX EMB PROT 6MM (WIRE) ×1 IMPLANT
GUIDEWIRE ZILIENT 6G 014 (WIRE) ×1 IMPLANT
KIT ENCORE 26 ADVANTAGE (KITS) ×1 IMPLANT
KIT PV (KITS) ×3 IMPLANT
PERCLOSE PROGLIDE 6F (VASCULAR PRODUCTS) ×3
SHEATH HIGHFLEX ANSEL 7FR 55CM (SHEATH) ×1 IMPLANT
SHEATH PINNACLE 5F 10CM (SHEATH) ×1 IMPLANT
SHEATH PROBE COVER 6X72 (BAG) ×1 IMPLANT
SYR MEDRAD MARK 7 150ML (SYRINGE) ×3 IMPLANT
TAPE VIPERTRACK RADIOPAQ (MISCELLANEOUS) IMPLANT
TAPE VIPERTRACK RADIOPAQUE (MISCELLANEOUS) ×3
TRANSDUCER W/STOPCOCK (MISCELLANEOUS) ×3 IMPLANT
TRAY PV CATH (CUSTOM PROCEDURE TRAY) ×3 IMPLANT
WIRE HITORQ VERSACORE ST 145CM (WIRE) ×1 IMPLANT
WIRE ROSEN-J .035X180CM (WIRE) ×1 IMPLANT

## 2020-04-18 NOTE — Telephone Encounter (Signed)
Last OV was 12/09/19, last filled on 02/22/20 #45 g with 1 refill, please advise

## 2020-04-18 NOTE — Interval H&P Note (Signed)
History and Physical Interval Note:  04/18/2020 7:37 AM  Diane [name]  has presented today for surgery, with the diagnosis of pad.  The various methods of treatment have been discussed with the patient and family. After consideration of risks, benefits and other options for treatment, the patient has consented to  Procedure(s): ABDOMINAL AORTOGRAM W/LOWER EXTREMITY (N/A) as a surgical intervention.  The patient's history has been reviewed, patient examined, no change in status, stable for surgery.  I have reviewed the patient's chart and labs.  Questions were answered to the patient's satisfaction.     Quay Burow

## 2020-04-18 NOTE — Progress Notes (Signed)
Ambulated in hallway and to the bathroom to void tol well no bleeding noted from left groin before or after ambulation

## 2020-04-18 NOTE — Discharge Instructions (Signed)
    Discharge Instructions  Lower Extremity Angiogram; Angioplasty/Stenting  Please refer to the following instructions for your post-procedure care. Your surgeon or physician assistant will discuss any changes with you.  Activity  Avoid lifting more than 10 pounds (1 gallons of milk) for 5 days after your procedure. You may walk as much as you can tolerate. It's OK to drive after 24 hours.  Bathing/Showering  You may shower the day after your procedure. If you have a bandage, you may remove it at 24- 48 hours. Clean your incision site with mild soap and water. Pat the area dry with a clean towel.  Diet  DRINK PLENTY OF FLUIDS FOR THE NEXT 2-3 DAYS.  Resume your pre-procedure diet. There are no special food restrictions following this procedure. All patients with peripheral vascular disease should follow a low fat/low cholesterol diet. In order to heal from your surgery, it is CRITICAL to get adequate nutrition. Your body requires vitamins, minerals, and protein. Vegetables are the best source of vitamins and minerals. Vegetables also provide the perfect balance of protein. Processed food has little nutritional value, so try to avoid this.  Medications  HOLD METFORMIN FOR A FULL 48 HOURS AFTER DISCHARGE.  Resume taking all of your medications unless your doctor tells you not to. If your incision is causing pain, you may take over-the-counter pain relievers such as acetaminophen (Tylenol)  Follow Up  Follow up will be arranged at the time of your procedure. You may have an office visit scheduled or may be scheduled for surgery. Ask your surgeon if you have any questions.  Please call us immediately for any of the following conditions: .Severe or worsening pain your legs or feet at rest or with walking. .Increased pain, redness, drainage at your groin puncture site. .Fever of 101 degrees or higher. .If you have any mild or slow bleeding from your puncture site: lie down, apply firm  constant pressure over the area with a piece of gauze or a clean wash cloth for 30 minutes- no peeking!, call 911 right away if you are still bleeding after 30 minutes, or if the bleeding is heavy and unmanageable.  Reduce your risk factors of vascular disease:  Stop smoking. If you would like help call [facility] at 1-800-QUIT-NOW 7652218765) or [place] at [phone]. Manage your cholesterol Maintain a desired weight Control your diabetes Keep your blood pressure down  If you have any questions, please call the office at [phone]

## 2020-04-19 NOTE — Progress Notes (Signed)
PCP: [name], MD Cardiology: Dr. Martinique HF Cardiology: Dr. Aundra Dubin  HPI:  64 y.o. with history of CAD, ischemic cardiomyopathy, CVA, and valvular disease was referred by Dr. Martinique for CHF evaluation.  Patient has an extensive history of vascular disease.  She had CABG in 2007.  Repeat LHC in 2007 showed all grafts closed except LIMA-LAD.  Patient had DES to LCx/OM.  [place] 2016 showed occlusion of DES to LCx/OM with L-L and L-R collaterals. Poor targets for redo CABG.   In 2/21, echo was done showing EF 20-25% with moderate RV dysfunction, moderate-severe MR, moderate TR.  Despite smoking history, PFTs in 2019 looked relatively normal. She additionally had the placement of a carotid stent on the right in 1/19.    RHC was done in 4/21, showing near-normal filling pressures with CI 2.12 by Fick.  CPX in 5/21 showed peak VO2 13, VE/VCO2 slope 37 => moderate HF limitation.  The PFTs on the CPX were not markedly abnormal. She saw Dr. Gwenlyn Found recently, peripheral arterial dopplers in 4/21 showed significant stenosis right SFA.   We worked her up for LVAD.  She was presented at [facility] and saw Dr. Prescott Gum.  He was concerned by her small stature and small LV cavity. Concern that Heartmate 3 would not function well in her small LV, and Heartware LVAD that was thought to be more suitable for small patients has been discontinued.   Patient recently returned to [facility] for follow up with Dr. Aundra Dubin on 03/24/20. She was off dapagliflozin due to recurrrent GU yeast infections. She was tolerating spironolactone 12.5 daily without excessive lightheadedness.  She had generalized weakness and was short of breath after walking 50-100 feet.  She seemed, however, more limited by stable bilateral calf claudication.  No rest pain or pedal ulcers.  She uses a rollator.  She was staying off cigarettes.  Weight was down 6 lbs.  Pulse sometimes drops to the 40s-50s, but she denied lightheadedness.   Today she returns to HF  clinic for pharmacist medication titration. At last visit with MD, spironolactone was increased to 25 mg daily. She states she feels poorly most days. Only has 1-2 good days per week. Today is a good day so she plans to go to the grocery store. Complains of dizziness that limits her from doing her daily activities. She was unable to go to her sister's house yesterday because she felt dizzy and weak. Dizziness seems unrelated to her BP readings, although she is typically on the low side. BP in clinic today was 112/68 and she said she was a bit dizzy even sitting down. States the dizziness has not gotten worse since the spironolactone was increased. She does note increased chest pain over the last month. Had to use her NTG spray twice last week which resulted in resolution of symptoms. This increase in CP seems to correspond to when her Imdur was decreased. Also complains of fatigue due to poor sleep and leg pain. Weight has been stable at home and in clinic at 126 lbs. She takes torsemide 60 mg AM/40 mg PM along with metolazone 2.5 mg every Monday. Notes she usually needs an extra metolazone on Fridays as well. No LEE, PND or orthopnea. Still smoking 1/2 ppd.    HF Medications: Spironolactone 25 mg daily Digoxin 0.0625 mg daily Torsemide 60 mg AM/40 mg PM Metolazone 2.5 mg every Monday Potassium chloride 20 mEq BID   Does the patient have any problems obtaining medications due to  transportation or finances?   no  Understanding of regimen: good Understanding of indications: good Potential of compliance: good Patient understands to avoid NSAIDs. Patient understands to avoid decongestants.    Pertinent Lab Values (03/31/20): [name] Serum creatinine 0.87, BUN 33, Potassium 3.8, Sodium 132  Vital Signs: . Weight: 126.2 lbs (last clinic weight: 126.2 lbs) . Blood pressure: 112/68  . Heart rate: 82   Assessment: 1. Chronic systolic CHF: Ischemic cardiomyopathy.  Echo in 2/21 showed EF 20-25%,  moderately dilated and moderately dysfunctional RV, PASP 49, severe biatrial enlargement, moderate-severe MR, severe TR, mild-moderate AS, IVC dilated. CPX (5/21) with moderate HF limitation, RHC (4/21) with CI low at 2.12.    -NYHA class III-IV symptoms, very limited chronically.  She does not appear significantly volume overloaded today by exam.  Medication titration has been severely limited by hypotension and orthostatic symptoms.  - Continue torsemide 60 qam/40 qpm.  She will take metolazone 2.5 mg once a week on Mondays.  - Continue digoxin 0.0625 mg daily - Continue spironolactone 25 mg daily - She has not tolerated BP-active meds well, avoid carvedilol and Entresto for now.  She became hypotensive with losartan.  - She did not tolerate SGLT2 inhibitor due to recurrent GU yeast infections.  - Evaluated her for vericiguat today. Given hypotension, I feel that she would be a poor vericiguat candidate. Additionally, patients on long acting nitrates were excluded from the VICTORIA trial. Given that she is on Imdur and is having to use more NTG spray, I think she is at significant risk of hypotension from vericiguat.   - NYHA class III-IV symptoms with marginal cardiac index (2.1).  Moderate HF limitation on CPX.  Medication titration limited by orthostatic symptoms.  We evaluated her for LVAD.  However, with small stature and small LV cavity, thought by surgeon not to be a good HM3 candidate (also would be redo sternotomy).  Heartware no longer available.  Instead, will see if she qualifies for barostimulation activation therapy through the ANTHEM trial.   2. CAD: s/p CABG 3009 complicated by early graft closure.  Repeat cath in 2007 showed only LIMA-LAD still open and she had DES to LCX/OM.  Cath in 2016 showed this stent was occluded.  She has not had targets for redo CABG and she does not have good interventional targets. No recent CP. - Continue ASA 81 and Crestor 40 mg daily. Good LDL in 6/21.  -  Continue ranolazone 1000 mg BID.  - Continue Imdur 30 mg daily.  3. Smoking: She quit recently.     4. PAD: Significant right SFA stenosis with claudication, no rest symptoms or ulcers.   -SFA intervention performed 04/18/20 5. Carotid stenosis: Moderate stenosis, repeat dopplers 6/22.   6. Fe deficiency anemia: Has been FOBT negative.  Has had Feraheme.  Most recent hgb looks stable.    Plan: 1) Medication changes: Based on clinical presentation, vital signs and recent labs will make no medication changes today. 2) Follow-up: 3 weeks with Dr. Rush Farmer, PharmD, BCPS, [facility], CPP Heart Failure Clinic Pharmacist [phone]

## 2020-04-23 NOTE — Progress Notes (Deleted)
Diane [name] Date of Birth: 1956-01-03 Medical Record #[id number]  History of Present Illness: Diane [name] is seen for follow up CAD. She has a history of coronary disease and is status post CABG in March of 2007 by Dr. [name]. She presented later that year with recurrent angina. Repeat cardiac cath showed patent LIMA to the LAD but all other grafts occluded including SVG to OM, SVG to AC/PL, and radial graft to ramus intermediate. She then had complex stenting of the mid LCx and first OM with Taxus stents. The native RCA was occluded with collaterals.   In 2016 she was evaluated for abdominal surgery. Myoview abnormal. Cardiac cath as noted above but now stents in LCx and OM occluded. No suitable targets for PCI or surgery. Severe LV dysfunction with moderate pulmonary HTN and normal LV filling pressures. She has multiple cardiac risk factors including diabetes, dyslipidemia, hypertension, and tobacco abuse. She also has  bilateral carotid arterial disease. She continues to smoke.   In August 2016 she had removal of a large pelvic mass with BSO and sigmoid colectomy. Path c/w ovarian cystadenoma. No complications.   She is followed by Dr. Gwenlyn Found for her carotid disease. Angiogram performed with findings of 70% bilateral stenoses. The LICA was heavily calcified. Both arteries had acute angulation after the stenosis making it less favorable for stenting. In March she developed dizziness, lightheadedness, and imbalance. Dr. Glori Bickers ordered an MRI that showed an old right MCA infarct and chronic lacunar infarcts that were new from 2014.   On a prior visit with me we switched her losartan to [facility]. She developed a persistent cough with clear phlegm. [name] was stopped and losartan resumed- cough resolved.  CXR showed no active disease.   She previously complained of pain in her thighs bilaterally. LE dopplers shows good ABIs of 0.93 bilaterally. Mild right iliac stenosis. Moderate right femoral and  SFA disease. Seen by Dr. Gwenlyn Found and medical therapy recommended. This past fall carotid dopplers showed progression of right carotid disease. She was not a candidate for CEA so underwent right carotid stenting with good results.   She reports she had a bad fall in 2019 while standing in a [place] shop. Legs just gave way and she fell.  She reports now that she is feeling progressively weaker and more unsteady. Has to use a walker now. Legs get weak and she gives out walking across her house. Has occasional chest pain that has not changed. Occasional heart skipping. She is chronically SOB. No edema. Last A1c 6.7 %. Feels cold. She was recently diagnosed with anemia. Hgb 9.9. FOBT positive. She did have upper EGD without acute findings.  In June she was noted to be hypotensive. Coreg dose was reduced. Earlier this year seen again with profound hypotension leading to discontinuation of losartan and Coreg. She did develop some volume excess and metolazone was added to BID lasix. Echo repeated and showed EF 20-25%, mod-severe MR and severe TR with estimated PA pressure 49 mm Hg. Mild to moderate AS. She is receiving iron infusions for iron deficiency anemia.   She was referred to the Advanced heart failure clinic and saw Dr Aundra Dubin on April 21. Class IIIb symptoms. Orthostatic symptoms limited ability to titrate medication. He was concerned about low output. She was started on digoxin. Lasix was switched to torsemide with use of metolazone PRN. RHC and CPX planned to guide management with possible consideration for LVAD.  RHC was done in 4/21, showing near-normal filling pressures with  CI 2.12 by Fick.  CPX in 5/21 showed peak VO2 13, VE/VCO2 slope 37 => moderate HF limitation.  The PFTs on the CPX were not markedly abnormal. She saw Dr. Gwenlyn Found recently, peripheral arterial dopplers in 4/21 showed significant stenosis right SFA.   We worked her up for LVAD.  She was presented at [facility] and saw Dr. Prescott Gum.  He  was concerned by her small stature and small LV cavity. Concern that Heartmate 3 would not function well in her small LV, and Heartware LVAD that was thought to be more suitable for small patients has been discontinued. She is off dapagliflozin due to recurrrent GU yeast infections. She was started on low dose spironolactone.  She continued to have limiting claudication. Seen by Dr Gwenlyn Found and underwent atherectomy and coated balloon angioplasty of the right SFA on 04/18/20.   Current Outpatient Medications on File Prior to Visit  Medication Sig Dispense Refill  . acetaminophen (TYLENOL) 500 MG tablet Take 1,000 mg by mouth every 6 (six) hours as needed for moderate pain or headache.    . albuterol (VENTOLIN HFA) 108 (90 Base) MCG/ACT inhaler INHALER 2 PUFFS EVERY 6 HOURS AS NEEDED FOR WHEEZING/SHORTNESS OF BREATH (Patient taking differently: Inhale 2 puffs into the lungs every 6 (six) hours as needed. ) 18 g 1  . aspirin EC 81 MG tablet Take 81 mg by mouth every evening.  30 tablet 6  . clopidogrel (PLAVIX) 75 MG tablet Take 1 tablet (75 mg total) by mouth daily. 90 tablet 3  . dexlansoprazole (DEXILANT) 60 MG capsule Take 60 mg by mouth daily.    . digoxin (LANOXIN) 0.125 MG tablet TAKE 0.5 TABLETS (0.0625 MG TOTAL) BY MOUTH DAILY. 45 tablet 3  . diphenhydrAMINE (BENADRYL) 25 mg capsule Take 25 mg by mouth 2 (two) times daily as needed for itching or allergies. (Patient not taking: Reported on 03/31/2020)    . fluticasone (FLONASE) 50 MCG/ACT nasal spray Place 2 sprays into both nostrils daily. 48 g 1  . gabapentin (NEURONTIN) 300 MG capsule TAKE 2 CAPSULES BY MOUTH EVERY DAY AT BEDTIME 180 capsule 1  . glimepiride (AMARYL) 4 MG tablet Take 4 mg by mouth in the morning and at bedtime.    . Glycopyrrolate-Formoterol (BEVESPI AEROSPHERE) 9-4.8 MCG/ACT AERO Inhale 2 puffs into the lungs 2 (two) times daily. 32.1 g 1  . guaiFENesin (MUCINEX) 600 MG 12 hr tablet Take 2 tablets (1,200 mg total) by mouth 2  (two) times daily. 360 tablet 3  . icosapent Ethyl (VASCEPA) 1 g capsule Take 2 capsules (2 g total) by mouth 2 (two) times daily. 120 capsule 3  . iron polysaccharides (NU-IRON) 150 MG capsule Take 1 capsule (150 mg total) by mouth 2 (two) times daily. 180 capsule 3  . isosorbide mononitrate (IMDUR) 60 MG 24 hr tablet Take 60 mg by mouth daily.     [name] levETIRAcetam (KEPPRA) 1000 MG tablet Take 1 tablet (1,000 mg total) by mouth 2 (two) times daily. 180 tablet 3  . melatonin 3 MG TABS tablet Take 3 mg by mouth at bedtime as needed (sleep.).    [name] metFORMIN (GLUCOPHAGE-XR) 500 MG 24 hr tablet Take 500 mg by mouth every evening.   6  . metolazone (ZAROXOLYN) 2.5 MG tablet Take 1 tablet (2.5 mg total) by mouth once a week. On mondays 10 tablet 4  . nicotine (NICODERM CQ - DOSED IN MG/24 HOURS) 21 mg/24hr patch Place 21 mg onto the skin daily.    [name]  nicotine polacrilex (NICORETTE) 4 MG gum Take 1 each (4 mg total) by mouth as needed for smoking cessation. 90 tablet 2  . nitroGLYCERIN (NITROLINGUAL) 0.4 MG/SPRAY spray Place 1 spray under the tongue every 5 (five) minutes x 3 doses as needed for chest pain. 12 g 3  . NYSTATIN powder APPLY 1 APPLICATION        TOPICALLY 4    TIMES A     DAY AS NEEDED    FOR YEAST    INFECTIONS 45 g 1  . ONE TOUCH ULTRA TEST test strip USE AS DIRECTED FOR TESTING BLOOD GLUCOSE 3 TIMES DAILY  1  . polyethylene glycol (MIRALAX / GLYCOLAX) 17 g packet Take 17 g by mouth daily.    . potassium chloride SA (KLOR-CON) 20 MEQ tablet Take 1 tablet (20 mEq total) by mouth 2 (two) times daily.    . promethazine (PHENERGAN) 25 MG tablet Take 1 tablet (25 mg total) by mouth every 8 (eight) hours as needed for nausea or vomiting. Caution of sedation 30 tablet 1  . ranolazine (RANEXA) 1000 MG SR tablet Take 1 tablet (1,000 mg total) by mouth 2 (two) times daily. 180 tablet 3  . rosuvastatin (CRESTOR) 20 MG tablet Take 40 mg by mouth daily.    . sodium chloride (OCEAN) 0.65 % SOLN nasal  spray Place 1 spray into both nostrils as needed for congestion.    [name] spironolactone (ALDACTONE) 25 MG tablet Take 1 tablet (25 mg total) by mouth at bedtime. 90 tablet 3  . torsemide (DEMADEX) 20 MG tablet Take 3 tablets (60 mg total) by mouth every morning AND 2 tablets (40 mg total) every evening. 150 tablet 4  . TRESIBA FLEXTOUCH 100 UNIT/ML FlexTouch Pen Inject 30 Units into the skin daily.      Current Facility-Administered Medications on File Prior to Visit  Medication Dose Route Frequency Provider Last Rate Last Admin  . sodium chloride flush (NS) 0.9 % injection 3 mL  3 mL Intravenous Q12H Lorretta Harp, MD        Allergies  Allergen Reactions  . Bee Venom Itching and Swelling  . Benadryl [Diphenhydramine] Other (See Comments)    Insomnia   . Entresto [Sacubitril-Valsartan] Cough  . Farxiga [Dapagliflozin]     Yeast infection  . Tetracycline     Unknown, pt cannot recall exact reaction  . Ace Inhibitors Cough    Past Medical History:  Diagnosis Date  . Anemia   . Carotid stenosis    a. s/p right carotid stent 10/2017.  [name] Chronic systolic CHF (congestive heart failure) ([place])   . Colon polyps   . COPD (chronic obstructive pulmonary disease) ([place])   . Coronary artery disease    post CABG in 3/07 , coronary stents   . Diabetes mellitus    type 2  . Dyslipidemia   . Fibromyalgia   . GERD (gastroesophageal reflux disease)   . Headache    hx of   . Hyperlipidemia   . Hypertension   . Myocardial infarction ([place])   . Pneumonia    hx of   . Seizures ([place])    last seizure- 03/2013   . Shortness of breath dyspnea    with exertion or when fluid builds up   . Sleep apnea    used to wear a cpap- not used in 3 years   . Status post dilation of esophageal narrowing   . Stroke [facility]) 1993   problems with balance   .  Systolic murmur    known mild AS and MR  . Thyroid goiter   . Tobacco abuse     Past Surgical History:  Procedure Laterality Date  . ABDOMINAL  AORTOGRAM W/LOWER EXTREMITY N/A 04/18/2020   Procedure: ABDOMINAL AORTOGRAM W/LOWER EXTREMITY;  Surgeon: Lorretta Harp, MD;  Location: [facility];  Service: Cardiovascular;  Laterality: N/A;  . APPENDECTOMY    . BIOPSY  02/03/2019   Procedure: BIOPSY;  Surgeon: Ladene Artist, MD;  Location: [facility];  Service: Endoscopy;;  . BREAST BIOPSY Left 2016  . CARDIAC CATHETERIZATION  11/29/05   EF of 55%  . CARDIAC CATHETERIZATION  08/06/06   EF of 45-50%  . CARDIAC CATHETERIZATION N/A 05/06/2015   Procedure: Right/Left Heart Cath and Coronary/Graft Angiography;  Surgeon: Burna Atlas M Martinique, MD;  Location: [place] [facility];  Service: Cardiovascular;  Laterality: N/A;  . CAROTID PTA/STENT INTERVENTION N/A 10/10/2017   Procedure: CAROTID PTA/STENT INTERVENTION - Right;  Surgeon: Serafina Mitchell, MD;  Location: [place] [facility];  Service: Cardiovascular;  Laterality: N/A;  . CERVICAL FUSION  1990  . CHOLECYSTECTOMY    . COLON RESECTION     mass removed and 4 in of colon  . COLONOSCOPY WITH PROPOFOL N/A 07/16/2017   Procedure: COLONOSCOPY WITH PROPOFOL;  Surgeon: Ladene Artist, MD;  [facility]: [facility];  Service: Endoscopy;  Laterality: N/A;  . CORONARY ARTERY BYPASS GRAFT  12/04/2005   x5 -- left internal mammary artery to the LAD, left radial artery to the ramus intermedius, saphenous vein graft to the obtuse marginal 1, sequential saphenous vein grat to the acute marginal and posterior descending, endoscopic vein harvesting from the left thigh with open vein harvest from right leg  . CORONARY STENT PLACEMENT  08/11/06   PCI of her ciurcumflex/OM vessel  . ESOPHAGOGASTRODUODENOSCOPY (EGD) WITH PROPOFOL N/A 02/03/2019   Procedure: ESOPHAGOGASTRODUODENOSCOPY (EGD) WITH PROPOFOL;  Surgeon: Ladene Artist, MD;  [facility];  Service: Endoscopy;  Laterality: N/A;  . LAPAROTOMY Bilateral 05/19/2015   Procedure: EXPLORATORY LAPAROTOMY WITH BILATERAL SALPINGO OOPHORECTOMY  /OMENTECTOMY/SEGMENTAL SIGMOID COLECTOMY ;  Surgeon: Everitt Amber, MD;  Location: [facility];  Service: Gynecology;  Laterality: Bilateral;  . PERIPHERAL VASCULAR BALLOON ANGIOPLASTY  04/18/2020   Procedure: PERIPHERAL VASCULAR BALLOON ANGIOPLASTY;  Surgeon: Lorretta Harp, MD;  Location: [place] [facility];  Service: Cardiovascular;;  rt SFA  atherectomy and DCB  . PERIPHERAL VASCULAR CATHETERIZATION N/A 11/15/2015   Procedure: Carotid PTA/Stent Intervention;  Surgeon: Lorretta Harp, MD;  Location: [place] [facility];  Service: Cardiovascular;  Laterality: N/A;  . PERIPHERAL VASCULAR CATHETERIZATION  11/15/2015   Procedure: Carotid Angiography;  Surgeon: Lorretta Harp, MD;  Location: [place] [facility];  Service: Cardiovascular;;  . RIGHT HEART CATH N/A 01/27/2020   Procedure: RIGHT HEART CATH;  Surgeon: Larey Dresser, MD;  Location: [place] [facility];  Service: Cardiovascular;  Laterality: N/A;    Social History   Tobacco Use  Smoking Status Current Every Day Smoker  . Packs/day: 1.00  . Years: 51.00  . Pack years: 51.00  . Types: Cigarettes  . Start date: 56  . Last attempt to quit: 02/22/2020  . Years since quitting: 0.1  Smokeless Tobacco Never Used    Social History   Substance and Sexual Activity  Alcohol Use No    Family History  Problem Relation Age of Onset  . Heart disease Mother   . Diabetes Mother   . COPD  Mother   . Hyperlipidemia Mother   . Hypertension Mother   . Cancer Father        met, origin unknown  . Heart attack Father   . Drug abuse Paternal Grandmother   . Stroke Paternal Grandfather   . Lung cancer Paternal Aunt        lung with mets to brain  . Melanoma Paternal Uncle   . Lung cancer Paternal Uncle        lung/liver to brain  . Cancer Paternal Uncle        cancer of unknown type  . Heart disease Maternal Grandfather   . Hypertension Sister   . Cancer Sister        eyelid  . Glaucoma Sister   . Heart disease Maternal Aunt        x  2 aunts    Review of Systems: As noted in history of present illness.  All other systems were reviewed and are negative.  Physical Exam: LMP 09/02/2003  GENERAL:  Well appearing WF in NAD HEENT:  PERRL, EOMI, sclera are clear. Oropharynx is clear. NECK:  No jugular venous distention, bilateral carotid bruits L>R, no thyromegaly or adenopathy LUNGS:  Clear to auscultation bilaterally CHEST:  Unremarkable HEART:  RRR,  PMI not displaced or sustained,S1 and S2 within normal limits, no S3, no S4: no clicks, no rubs, gr 2/6 systolic murmur RUSB.  ABD:  Soft, nontender. BS +, no masses or bruits. No hepatomegaly, no splenomegaly EXT:  1+ pulses throughout, no edema, no cyanosis no clubbing SKIN:  Warm and dry.  No rashes NEURO:  Alert and oriented x 3. Cranial nerves II through XII intact. PSYCH:  Cognitively intact    LABORATORY DATA: Lab Results  Component Value Date   WBC 8.8 04/14/2020   HGB 16.9 (H) 04/14/2020   HCT 46.1 04/14/2020   PLT 257 04/14/2020   GLUCOSE 220 (H) 04/14/2020   CHOL 148 03/07/2020   TRIG 247 (H) 03/07/2020   HDL 38 (L) 03/07/2020   LDLDIRECT 76.0 04/11/2017   LDLCALC 61 03/07/2020   ALT 24 04/05/2020   AST 23 04/05/2020   NA 135 04/14/2020   K 5.0 04/14/2020   CL 90 (L) 04/14/2020   CREATININE 0.76 04/14/2020   BUN 17 04/14/2020   CO2 28 04/14/2020   TSH 3.029 02/24/2020   INR 1.0 02/24/2020   HGBA1C 8.6 (H) 02/24/2020   MICROALBUR 1.9 06/09/2013   Ecg today shows NSR with rate 86. Old inferior MI. T wave abnormality c/w lateral ischemia. Unchanged from prior. I have personally reviewed and interpreted this study.  Echo 11/17/19: IMPRESSIONS    1. Left ventricular ejection fraction, by estimation, is 20 to 25%. Left  ventricular ejection fraction by 3D volume is 23 %. The left ventricle has  severely decreased function. The left ventricle demonstrates global  hypokinesis. Left ventricular  diastolic parameters are consistent with Grade II  diastolic dysfunction  (pseudonormalization). Elevated left atrial pressure.  2. Right ventricular systolic function is moderately reduced. The right  ventricular size is moderately enlarged. There is moderately elevated  pulmonary artery systolic pressure. The estimated right ventricular  systolic pressure is 96.2 mmHg.  3. Left atrial size was severely dilated.  4. Right atrial size was severely dilated.  5. The mitral valve is degenerative. Moderate to severe mitral valve  regurgitation. No evidence of mitral stenosis.  6. Tricuspid valve regurgitation is severe.  7. Mild to moderate aortic stenosis. V max 2.2  m/s, mean gradient 10.5  mmHG. Gradients lower due to lower stroke volume (SV=38 cc; SVi=24 cc/m2).  The aortic valve is tricuspid. Aortic valve regurgitation is not  visualized.  8. There is mild dilatation of the ascending aorta measuring 41 mm.  9. The inferior vena cava is dilated in size with <50% respiratory  variability, suggesting right atrial pressure of 15 mmHg.   Comparison(s): A prior study was performed on 04/25/2015. Prior images  reviewed side by side. Changes from prior study are noted. EF now 20-25%.  Severe TR. Moderate to severe MR present. RV moderately enlarged with  reduced function.   Assessment / Plan: 1. CAD S/p CABG. Failed bypass grafts except for LIMA to LAD. S/p remote stenting of LCX and OM with Taxus DES now occluded. Stable class 2 angina. Really unchanged from before. No targets for intervention. Continue aggressive antianginal Rx. With ASA, plavix,  and Ranexa. Intolerant of beta blockers and nitrates due to hypotension.Encourage  smoking cessation.  2. Carotid arterial disese- s/p right carotid stenting. Followed by Dr. Gwenlyn Found. On chronic ASA/Plavix. Most recent dopplers showed LICA stenosis of 47-12%.  3. Tobacco abuse. Recommend complete cessation.  4. Chronic combined systolic and diastolic CHF. Severe LV dysfunction. Intolerant of  ARB/entresto and Coreg due to hypotension. Now on torsemide and spironolactone. Followed by Dr Aundra Dubin in Advanced heart failure. Appears to be a poor candidate for LVAD 5. Pulmonary HTN. 6. S/p major abdominal surgery for cystadenoma. Now with CT showing evidence of incisional hernia. Poor candidate for general anesthesia. Conservative management.  7. Lacunar infarcts.  8. PAD. S/p recent atherectomy and balloon angioplasty of the right SFA by Dr Gwenlyn Found. 10. Iron deficiency anemia with FOBT positive. Prior EGD showed no reason for blood loss. Receiving intermittent iron infusions.  I will follow up in 3 months.

## 2020-04-26 ENCOUNTER — Other Ambulatory Visit (HOSPITAL_COMMUNITY): Payer: Medicare Other

## 2020-04-26 ENCOUNTER — Ambulatory Visit: Payer: Medicare Other | Admitting: Cardiology

## 2020-04-27 ENCOUNTER — Other Ambulatory Visit: Payer: Self-pay | Admitting: Cardiovascular Disease

## 2020-04-27 ENCOUNTER — Other Ambulatory Visit: Payer: Self-pay

## 2020-04-27 ENCOUNTER — Ambulatory Visit (HOSPITAL_COMMUNITY)
Admission: RE | Admit: 2020-04-27 | Discharge: 2020-04-27 | Disposition: A | Discharge status: Home / Self Care | Payer: Medicare Other | Source: Ambulatory Visit | Attending: Cardiology | Admitting: Cardiology

## 2020-04-27 DIAGNOSIS — I739 Peripheral vascular disease, unspecified: Secondary | ICD-10-CM

## 2020-04-27 NOTE — Progress Notes (Signed)
Spoke to patient lower ext arterial doppler results given.Advised to repeat in 6 months.

## 2020-05-02 ENCOUNTER — Other Ambulatory Visit: Payer: Self-pay

## 2020-05-02 ENCOUNTER — Ambulatory Visit (HOSPITAL_COMMUNITY)
Admission: RE | Admit: 2020-05-02 | Discharge: 2020-05-02 | Disposition: A | Discharge status: Home / Self Care | Payer: Medicare Other | Source: Ambulatory Visit | Attending: Internal Medicine | Admitting: Internal Medicine

## 2020-05-02 VITALS — BP 112/68 | Wt 126.2 lb

## 2020-05-02 DIAGNOSIS — Z79899 Other long term (current) drug therapy: Secondary | ICD-10-CM | POA: Insufficient documentation

## 2020-05-02 DIAGNOSIS — I5022 Chronic systolic (congestive) heart failure: Secondary | ICD-10-CM | POA: Diagnosis not present

## 2020-05-02 DIAGNOSIS — Z5181 Encounter for therapeutic drug level monitoring: Secondary | ICD-10-CM | POA: Diagnosis not present

## 2020-05-02 DIAGNOSIS — I2510 Atherosclerotic heart disease of native coronary artery without angina pectoris: Secondary | ICD-10-CM | POA: Diagnosis not present

## 2020-05-02 DIAGNOSIS — I6529 Occlusion and stenosis of unspecified carotid artery: Secondary | ICD-10-CM | POA: Diagnosis not present

## 2020-05-02 DIAGNOSIS — I739 Peripheral vascular disease, unspecified: Secondary | ICD-10-CM | POA: Insufficient documentation

## 2020-05-02 DIAGNOSIS — F17211 Nicotine dependence, cigarettes, in remission: Secondary | ICD-10-CM | POA: Insufficient documentation

## 2020-05-02 DIAGNOSIS — Z8673 Personal history of transient ischemic attack (TIA), and cerebral infarction without residual deficits: Secondary | ICD-10-CM | POA: Insufficient documentation

## 2020-05-02 DIAGNOSIS — I255 Ischemic cardiomyopathy: Secondary | ICD-10-CM | POA: Insufficient documentation

## 2020-05-02 DIAGNOSIS — Z955 Presence of coronary angioplasty implant and graft: Secondary | ICD-10-CM | POA: Insufficient documentation

## 2020-05-02 DIAGNOSIS — Z951 Presence of aortocoronary bypass graft: Secondary | ICD-10-CM | POA: Diagnosis not present

## 2020-05-02 DIAGNOSIS — D509 Iron deficiency anemia, unspecified: Secondary | ICD-10-CM | POA: Diagnosis not present

## 2020-05-02 MED ORDER — ROSUVASTATIN CALCIUM 40 MG PO TABS: 40.0000 mg | ORAL_TABLET | Freq: Every day | ORAL | 3 refills | Status: DC

## 2020-05-02 NOTE — Patient Instructions (Signed)
It was a pleasure seeing you today!  MEDICATIONS: -No medication changes today -Call if you have questions about your medications.  NEXT APPOINTMENT: Return to clinic in 3 weeks with Dr. McLean.  In general, to take care of your heart failure: -Limit your fluid intake to 2 Liters (half-gallon) per day.   -Limit your salt intake to ideally 2-3 grams (2000-3000 mg) per day. -Weigh yourself daily and record, and bring that "weight diary" to your next appointment.  (Weight gain of 2-3 pounds in 1 day typically means fluid weight.) -The medications for your heart are to help your heart and help you live longer.   -Please contact us before stopping any of your heart medications.  Call the clinic at [phone] with questions or to reschedule future appointments.  

## 2020-05-03 ENCOUNTER — Encounter: Payer: Self-pay | Admitting: Cardiovascular Disease

## 2020-05-03 ENCOUNTER — Ambulatory Visit (INDEPENDENT_AMBULATORY_CARE_PROVIDER_SITE_OTHER): Payer: Medicare Other | Admitting: Cardiovascular Disease

## 2020-05-03 VITALS — BP 116/62 | HR 92 | Ht <= 58 in | Wt 127.8 lb

## 2020-05-03 DIAGNOSIS — I7090 Unspecified atherosclerosis: Secondary | ICD-10-CM | POA: Diagnosis not present

## 2020-05-03 DIAGNOSIS — I255 Ischemic cardiomyopathy: Secondary | ICD-10-CM | POA: Diagnosis not present

## 2020-05-03 DIAGNOSIS — I739 Peripheral vascular disease, unspecified: Secondary | ICD-10-CM

## 2020-05-03 NOTE — Patient Instructions (Signed)
Medication Instructions:  Your physician recommends that you continue on your current medications as directed. Please refer to the Current Medication list given to you today.  *If you need a refill on your cardiac medications before your next appointment, please call your pharmacy*  Lab Work: NONE ordered at this time of appointment   If you have labs (blood work) drawn today and your tests are completely normal, you will receive your results only by: [name] MyChart Message (if you have MyChart) OR . A paper copy in the mail If you have any lab test that is abnormal or we need to change your treatment, we will call you to review the results.  Testing/Procedures: Your physician has requested that you have a lower extremity arterial exercise duplex. During this test, exercise and ultrasound are used to evaluate arterial blood flow in the legs. Allow one hour for this exam. There are no restrictions or special instructions.   Please schedule for 6 months prior to 6 month follow up  Follow-Up: At [facility], you and your health needs are our priority.  As part of our continuing mission to provide you with exceptional heart care, we have created designated Provider Care Teams.  These Care Teams include your primary Cardiologist (physician) and Advanced Practice Providers (APPs -  Physician Assistants and Nurse Practitioners) who all work together to provide you with the care you need, when you need it.  Your next appointment:   6 month(s)  The format for your next appointment:   In Person  Provider:   Quay Burow, MD  Other Instructions

## 2020-05-03 NOTE — Assessment & Plan Note (Signed)
[name] returns on 04/18/2020 because of right lower extremity claudication.  I performed [facility] one directional atherectomy followed by drug-coated balloon angioplasty of diffusely diseased high-grade segmental proximal right SFA stenosis with three-vessel runoff.  She had excellent angiographic result.  Follow-up Dopplers performed 04/27/2020 revealed an increase in her right ABI from 0.76-0.89.  She does have a mildly elevated frequency in her proximal right SFA.  She says her leg feels no better currently and she has the pain in her knee and groin.  On exam she has 2+ femoral pulse and 2+ pedal pulse.

## 2020-05-03 NOTE — Progress Notes (Signed)
05/03/2020 [place]   September 13, 1956  [id number]  Primary Physician Tower, [name], MD Primary Cardiologist: Lorretta Harp MD Garret Reddish, [place], [place]  HPI:  KAILE BIXLER is a 64 y.o.  Caucasian female with a history of CAD status post coronary artery bypass grafting March 2007 by Dr. [name].I last saw her in the office  03/30/2020..She's had subsequent stenting and known occluded grafts except for her her LIMA. She has severe LV dysfunction with moderate pulmonary hypertension. I last saw her in the office 06/20/16. Her mother, [name], was a long-term patient of mine as well. Her other Problems include diabetes, hypokalemia, hypertension and ongoing tobacco abuse. She's had progression of her bilateral carotid artery stenosis left greater than right . She is neurologically asymptomatic. She is high risk for carotid endarterectomy. I performed cerebral angiography and her 11/15/15 revealing 70% bilateral ICA stenosis with pins beyond the stenosis and significant calcification on the left making stenting not appropriate. Her carotid Dopplers performed on 06/26/16 showed stable bilateral moderate ICA stenosis and lower extremity Dopplers performed 12/12/16 revealed ABIs 0.93 bilaterally with moderate but not severe disease. She does complain of some leg pain which has some atypical characteristics for claudication  She does continue to smoke but is trying to stop. She has ischemic cardiomyopathy with an EF of 20% range followed by Dr. Algernon Huxley. Her last cath in 2016 by Dr. Martinique revealed occluded grafts with poor targets for redo CABG. I did perform carotid stenting on her with Dr. Trula Slade 10/10/2017. She has less tolerating claudication with ABIs in the 0.7-0.8 range bilaterally. She has high-frequency signal in her proximal right SFA.  She apparently was turned down for LVAD therapy because her heart was too small.  She is compensated.  She wishes to proceed with right SFA  intervention for lifestyle limiting claudication.  I performed peripheral intervention on her 04/18/2020.  She had [facility] one directional atherectomy followed by drug-coated balloon angioplasty of high-grade segmental proximal right SFA stenosis with three-vessel runoff.  She had excellent angiographic result.  Her Dopplers performed on 04/19/2020 showed residual mild increased frequency in her proximal right SFA with ABIs increased from 0.76-0.89.  She does complain of some pain in her groin and knee.  Is unclear to me why her claudication has not improved however.  She does continue to smoke 1/2 pack/day.    Current Meds  Medication Sig   acetaminophen (TYLENOL) 500 MG tablet Take 1,000 mg by mouth every 6 (six) hours as needed for moderate pain or headache.   albuterol (VENTOLIN HFA) 108 (90 Base) MCG/ACT inhaler INHALER 2 PUFFS EVERY 6 HOURS AS NEEDED FOR WHEEZING/SHORTNESS OF BREATH (Patient taking differently: Inhale 2 puffs into the lungs every 6 (six) hours as needed. )   aspirin EC 81 MG tablet Take 81 mg by mouth every evening.    clopidogrel (PLAVIX) 75 MG tablet Take 1 tablet (75 mg total) by mouth daily.   dexlansoprazole (DEXILANT) 60 MG capsule Take 60 mg by mouth daily.   digoxin (LANOXIN) 0.125 MG tablet TAKE 0.5 TABLETS (0.0625 MG TOTAL) BY MOUTH DAILY.   diphenhydrAMINE (BENADRYL) 25 mg capsule Take 25 mg by mouth 2 (two) times daily as needed for itching or allergies.    fluticasone (FLONASE) 50 MCG/ACT nasal spray Place 2 sprays into both nostrils daily.   gabapentin (NEURONTIN) 300 MG capsule TAKE 2 CAPSULES BY MOUTH EVERY DAY AT BEDTIME   glimepiride (AMARYL) 4 MG tablet Take 4  mg by mouth in the morning and at bedtime.   Glycopyrrolate-Formoterol (BEVESPI AEROSPHERE) 9-4.8 MCG/ACT AERO Inhale 2 puffs into the lungs 2 (two) times daily.   guaiFENesin (MUCINEX) 600 MG 12 hr tablet Take 2 tablets (1,200 mg total) by mouth 2 (two) times daily.   icosapent Ethyl  (VASCEPA) 1 g capsule Take 2 capsules (2 g total) by mouth 2 (two) times daily.   iron polysaccharides (NU-IRON) 150 MG capsule Take 1 capsule (150 mg total) by mouth 2 (two) times daily.   isosorbide mononitrate (IMDUR) 30 MG 24 hr tablet Take 30 mg by mouth daily.    levETIRAcetam (KEPPRA) 1000 MG tablet Take 1 tablet (1,000 mg total) by mouth 2 (two) times daily.   metFORMIN (GLUCOPHAGE-XR) 500 MG 24 hr tablet Take 500 mg by mouth every evening.    metolazone (ZAROXOLYN) 2.5 MG tablet Take 1 tablet (2.5 mg total) by mouth once a week. On mondays   nicotine (NICODERM CQ - DOSED IN MG/24 HOURS) 21 mg/24hr patch Place 21 mg onto the skin daily.   nicotine polacrilex (NICORETTE) 4 MG gum Take 1 each (4 mg total) by mouth as needed for smoking cessation.   nitroGLYCERIN (NITROLINGUAL) 0.4 MG/SPRAY spray Place 1 spray under the tongue every 5 (five) minutes x 3 doses as needed for chest pain.   NYSTATIN powder APPLY 1 APPLICATION        TOPICALLY 4    TIMES A     DAY AS NEEDED    FOR YEAST    INFECTIONS   ONE TOUCH ULTRA TEST test strip USE AS DIRECTED FOR TESTING BLOOD GLUCOSE 3 TIMES DAILY   polyethylene glycol (MIRALAX / GLYCOLAX) 17 g packet Take 17 g by mouth daily.   potassium chloride SA (KLOR-CON) 20 MEQ tablet Take 1 tablet (20 mEq total) by mouth 2 (two) times daily.   promethazine (PHENERGAN) 25 MG tablet Take 1 tablet (25 mg total) by mouth every 8 (eight) hours as needed for nausea or vomiting. Caution of sedation   ranolazine (RANEXA) 1000 MG SR tablet Take 1 tablet (1,000 mg total) by mouth 2 (two) times daily.   rosuvastatin (CRESTOR) 40 MG tablet Take 1 tablet (40 mg total) by mouth daily.   sodium chloride (OCEAN) 0.65 % SOLN nasal spray Place 1 spray into both nostrils as needed for congestion.   spironolactone (ALDACTONE) 25 MG tablet Take 1 tablet (25 mg total) by mouth at bedtime.   torsemide (DEMADEX) 20 MG tablet Take 3 tablets (60 mg total) by mouth every  morning AND 2 tablets (40 mg total) every evening.   TRESIBA FLEXTOUCH 100 UNIT/ML FlexTouch Pen Inject 35 Units into the skin daily.      Allergies  Allergen Reactions   Bee Venom Itching and Swelling   Benadryl [Diphenhydramine] Other (See Comments)    Insomnia    Entresto [Sacubitril-Valsartan] Cough   Farxiga [Dapagliflozin]     Yeast infection   Tetracycline     Unknown, pt cannot recall exact reaction   Ace Inhibitors Cough    Social History   Socioeconomic History   Marital status: Divorced    Spouse name: Not on file   Number of children: 0   Years of education: Not on file   Highest education level: Not on file  Occupational History   Occupation: disabled  Tobacco Use   Smoking status: Current Every Day Smoker    Packs/day: 1.00    Years: 51.00    Pack years:  51.00    Types: Cigarettes    Start date: 9    Last attempt to quit: 02/22/2020    Years since quitting: 0.1   Smokeless tobacco: Never Used  Vaping Use   Vaping Use: Former  Substance and Sexual Activity   Alcohol use: No   Drug use: No   Sexual activity: Not Currently    Partners: Male    Birth control/protection: None  Other Topics Concern   Not on file  Social History Narrative   Right handed   Lives alone in a one story home   Social Determinants of Health   Financial Resource Strain:    Difficulty of Paying Living Expenses:   Food Insecurity:    Worried About Charity fundraiser in the Last Year:    Arboriculturist in the Last Year:   Transportation Needs:    Film/video editor (Medical):    Lack of Transportation (Non-Medical):   Physical Activity:    Days of Exercise per Week:    Minutes of Exercise per Session:   Stress:    Feeling of Stress :   Social Connections:    Frequency of Communication with Friends and Family:    Frequency of Social Gatherings with Friends and Family:    Attends Religious Services:    Active Member of Clubs or  Organizations:    Attends Music therapist:    Marital Status:   Intimate Partner Violence:    Fear of Current or Ex-Partner:    Emotionally Abused:    Physically Abused:    Sexually Abused:      Review of Systems: General: negative for chills, fever, night sweats or weight changes.  Cardiovascular: negative for chest pain, dyspnea on exertion, edema, orthopnea, palpitations, paroxysmal nocturnal dyspnea or shortness of breath Dermatological: negative for rash Respiratory: negative for cough or wheezing Urologic: negative for hematuria Abdominal: negative for nausea, vomiting, diarrhea, bright red blood per rectum, melena, or hematemesis Neurologic: negative for visual changes, syncope, or dizziness All other systems reviewed and are otherwise negative except as noted above.    Blood pressure 116/62, pulse 92, height 4\' 10"  (1.473 m), weight 127 lb 12.8 oz (58 kg), last menstrual period 09/02/2003, SpO2 97 %.  General appearance: alert and no distress Neck: no adenopathy, no carotid bruit, no JVD, supple, symmetrical, trachea midline and thyroid not enlarged, symmetric, no tenderness/mass/nodules Lungs: clear to auscultation bilaterally Heart: regular rate and rhythm, S1, S2 normal, no murmur, click, rub or gallop Extremities: extremities normal, atraumatic, no cyanosis or edema Pulses: 2+ and symmetric Skin: Skin color, texture, turgor normal. No rashes or lesions Neurologic: Alert and oriented X 3, normal strength and tone. Normal symmetric reflexes. Normal coordination and gait  EKG not performed today  ASSESSMENT AND PLAN:   Atherosclerotic vascular disease Ms. Osterberg returns on 04/18/2020 because of right lower extremity claudication.  I performed [facility] one directional atherectomy followed by drug-coated balloon angioplasty of diffusely diseased high-grade segmental proximal right SFA stenosis with three-vessel runoff.  She had excellent angiographic result.   Follow-up Dopplers performed 04/27/2020 revealed an increase in her right ABI from 0.76-0.89.  She does have a mildly elevated frequency in her proximal right SFA.  She says her leg feels no better currently and she has the pain in her knee and groin.  On exam she has 2+ femoral pulse and 2+ pedal pulse.      Lorretta Harp MD FACP,FACC,FAHA, [facility] 05/03/2020 3:57 PM

## 2020-05-10 ENCOUNTER — Telehealth: Payer: Self-pay

## 2020-05-10 NOTE — Telephone Encounter (Signed)
appt scheduled

## 2020-05-10 NOTE — Telephone Encounter (Signed)
Pt recently had a cardiac stent placed and is c/o pain. Cardiology advised her it is most likely arthritic and needs to get pain med from PCP. She was on oxycodone 5mg  in the past. Asking if she can get a new rx. Taking Tylenol now without relief. Almyra Free can be reached at [phone]. Pt uses [facility].

## 2020-05-10 NOTE — Telephone Encounter (Signed)
I need to see her for that

## 2020-05-16 ENCOUNTER — Other Ambulatory Visit: Payer: Self-pay

## 2020-05-16 ENCOUNTER — Encounter: Payer: Self-pay | Admitting: Family Medicine

## 2020-05-16 ENCOUNTER — Ambulatory Visit (INDEPENDENT_AMBULATORY_CARE_PROVIDER_SITE_OTHER): Payer: Medicare Other | Admitting: Family Medicine

## 2020-05-16 ENCOUNTER — Ambulatory Visit (INDEPENDENT_AMBULATORY_CARE_PROVIDER_SITE_OTHER)
Admission: RE | Admit: 2020-05-16 | Discharge: 2020-05-16 | Disposition: A | Discharge status: Home / Self Care | Payer: Medicare Other | Source: Ambulatory Visit | Attending: Family Medicine | Admitting: Family Medicine

## 2020-05-16 VITALS — BP 118/68 | HR 94 | Temp 97.5°F | Ht <= 58 in | Wt 126.1 lb

## 2020-05-16 DIAGNOSIS — M79604 Pain in right leg: Secondary | ICD-10-CM

## 2020-05-16 DIAGNOSIS — M25561 Pain in right knee: Secondary | ICD-10-CM

## 2020-05-16 DIAGNOSIS — I255 Ischemic cardiomyopathy: Secondary | ICD-10-CM

## 2020-05-16 DIAGNOSIS — R1031 Right lower quadrant pain: Secondary | ICD-10-CM

## 2020-05-16 DIAGNOSIS — M25551 Pain in right hip: Secondary | ICD-10-CM | POA: Diagnosis not present

## 2020-05-16 DIAGNOSIS — I708 Atherosclerosis of other arteries: Secondary | ICD-10-CM | POA: Diagnosis not present

## 2020-05-16 DIAGNOSIS — G8929 Other chronic pain: Secondary | ICD-10-CM | POA: Insufficient documentation

## 2020-05-16 MED ORDER — CYCLOBENZAPRINE HCL 10 MG PO TABS: 5.0000 mg | ORAL_TABLET | Freq: Every day | ORAL | 1 refills | Status: DC

## 2020-05-16 NOTE — Patient Instructions (Signed)
Let's check xray of knee and hip today   Try 1/2 to 1 pill of generic flexeril at night  Great caution of falls -this is sedating  Let me know how you do   You can try ice or heat on the painful area Tylenol is fine   Shoulder pain may be coming from neck

## 2020-05-16 NOTE — Assessment & Plan Note (Signed)
Xray now  Worse with ext rota of ips

## 2020-05-16 NOTE — Progress Notes (Signed)
Subjective:    Patient ID: Diane [name], female    DOB: 1955/12/15, 64 y.o.   MRN: [id number]  This visit occurred during the SARS-CoV-2 public health emergency.  Safety protocols were in place, including screening questions prior to the visit, additional usage of staff PPE, and extensive cleaning of exam room while observing appropriate contact time as indicated for disinfecting solutions.    HPI   Pt presents for issues with chronic pain   Wt Readings from Last 3 Encounters:  05/16/20 126 lb 2 oz (57.2 kg)  05/03/20 127 lb 12.8 oz (58 kg)  05/02/20 126 lb 3.2 oz (57.2 kg)   27.29 kg/m  Of note, recently inc crestor   Has sharp pain in entire lower leg (incl hip and knee) as well as groin  Also shoulder  The LE pain is keeping her up at night    Noted the groin pain more after the cath All studies were ok   Nl xray of shoulder in 2019  Nl xray of knee in 2019  Has known deg dz in neck   Per pt-also low back issues  / sharp pain   Last cxr DG Chest 2 View (Accession [id number]) (Order [id number]) Imaging Date: 03/07/2020 Department: [place] Released By: Milana Kidney A Authorizing: Larey Dresser, MD  Exam Status  Status  Final [99]  PACS Intelerad Image Link  Show images for DG Chest 2 View Study Result  Narrative & Impression  CLINICAL DATA:  LVAD evaluation.  EXAM: CHEST - 2 VIEW  COMPARISON:  12/25/2019  FINDINGS: Previous median sternotomy and CABG procedure. Mild cardiac enlargement. No pleural effusions. Pulmonary vascular congestion noted. No airspace opacities identified.  IMPRESSION: Mild cardiac enlargement and pulmonary vascular congestion   Electronically Signed   By: Kerby Moors M.D.   On: 03/08/2020 10:11   Has CT planned in oct  Is followed by lung cancer screening team  Last CT chest  IMPRESSION: 1. Nodularity at the lung apices with irregular thick-walled  cystic cavities described above. Findings favor langerhans cell histiocytosis. Would also consider respiratory bronchiolitis. Consider follow-up low-dose high-resolution chest CT and correlation with symptoms and smoking history. 2. Focal ulcerated plaque or chronic penetrating atherosclerotic ulcer of the descending thoracic aorta with similar contour the aorta when compared to the previous study. 3. 3.0 x 3.0 cm infrarenal abdominal aortic dilation. Recommend followup by ultrasound in 3 years. This recommendation follows ACR consensus guidelines: White Paper of the ACR Incidental Findings Committee II on Vascular Findings. J Am Coll Radiol 2013; 32:202-542 4. Hepatic steatosis. 5. Nodular adreniform thickening of the adrenal glands bilaterally increased in terms of thickness compared to the prior study. Perhaps related to adrenal hyperplasia. Attention on follow-up. 6. Aortic atherosclerosis.  Aortic Atherosclerosis (ICD10-I70.0).   She called on 8/10 stating that she recently had a cardiac stent placed and is c/o pain   Has h/o seizures and heart failure  Smokes Takes plavix and asa   Lab Results  Component Value Date   CREATININE 0.76 04/14/2020   BUN 17 04/14/2020   NA 135 04/14/2020   K 5.0 04/14/2020   CL 90 (L) 04/14/2020   CO2 28 04/14/2020   Lab Results  Component Value Date   ALT 24 04/05/2020   AST 23 04/05/2020   ALKPHOS 74 04/05/2020   BILITOT 0.7 04/05/2020    Topical voltaren gel and pain patches ([place]) -do not help  Tylenol was working until  pain worsened a few mo ago   Patient Active Problem List   Diagnosis Date Noted   Acute pain of right lower extremity 05/16/2020   Right knee pain 05/16/2020   Right groin pain 05/16/2020   Atherosclerotic vascular disease 03/03/2020   Healthcare maintenance 12/25/2019   Shortness of breath 12/25/2019   Dysphagia 12/09/2019   Nausea 12/09/2019   Iron deficiency anemia 11/05/2019   Coronary  artery disease of bypass graft of native heart with stable angina pectoris ([place]) 11/02/2019   Visit for routine gyn exam 04/23/2019   Generalized weakness 03/24/2019   Occult blood in stools    Positive colorectal cancer screening using DNA-based stool test 12/22/2018   Anemia 12/22/2018   Fatigue 12/05/2018   Estrogen deficiency 04/21/2018   Screening mammogram, encounter for 04/21/2018   Osteopenia 04/21/2018   History of HPV infection 04/21/2018   Dizzy spells 03/03/2018   Ischemic cardiomyopathy 01/21/2018   Other constipation 05/07/2017   Colon cancer screening 04/19/2017   Incisional hernia 01/14/2017   Smokers' cough ([place]) 08/13/2016   Chronic respiratory failure ([place]) 07/11/2016   Sleep-related hypoventilation due to lower airway obstruction 05/20/2016   Periodic limb movements of sleep 56/97/9480   Chronic systolic congestive heart failure ([place]) 05/08/2016   Type 2 diabetes mellitus with diabetic polyneuropathy, with long-term current use of insulin ([place]) 12/13/2015   B12 deficiency 12/01/2015   History of CVA (cerebrovascular accident) 11/29/2015   Abnormal nuclear stress test 05/06/2015   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 of localized onset, not intractable, without status epilepticus ([place]) 03/22/2015   Cervical disc disorder with radiculopathy of cervical region 03/22/2015   Complex partial seizures ([place]) 09/16/2013   Encounter for Medicare annual wellness exam 06/16/2013   Antiplatelet or antithrombotic long-term use 06/08/2013   Seizures ([place]) 03/25/2013   Goiter 09/01/2012   Other screening mammogram 06/13/2012   Allergic rhinitis 06/13/2012   Carotid artery disease ([place]) 08/20/2011   Obstructive sleep apnea 09/18/2007   TOBACCO USE 08/08/2007   Hyperlipidemia 03/26/2007   ANXIETY 03/26/2007   DEPRESSION 03/26/2007   Migraine headache 03/26/2007   CARPAL TUNNEL SYNDROME,  BILATERAL 03/26/2007   Essential hypertension 03/26/2007   Hx of CABG 03/26/2007   GERD 03/26/2007   Claudication in peripheral vascular disease ([place]) 03/26/2007   Past Medical History:  Diagnosis Date   Anemia    Carotid stenosis    a. s/p right carotid stent 10/2017.   Chronic systolic CHF (congestive heart failure) (HCC)    Colon polyps    COPD (chronic obstructive pulmonary disease) (HCC)    Coronary artery disease    post CABG in 3/07 , coronary stents    Diabetes mellitus    type 2   Dyslipidemia    Fibromyalgia    GERD (gastroesophageal reflux disease)    Headache    hx of    Hyperlipidemia    Hypertension    Myocardial infarction ([place])    Pneumonia    hx of    Seizures ([place])    last seizure- 03/2013    Shortness of breath dyspnea    with exertion or when fluid builds up    Sleep apnea    used to wear a cpap- not used in 3 years    Status post dilation of esophageal narrowing    Stroke [facility]) 1993   problems with balance    Systolic murmur    known mild AS and MR   Thyroid goiter  Tobacco abuse    Past Surgical History:  Procedure Laterality Date   ABDOMINAL AORTOGRAM W/LOWER EXTREMITY N/A 04/18/2020   Procedure: ABDOMINAL AORTOGRAM W/LOWER EXTREMITY;  Surgeon: Lorretta Harp, MD;  Location: [place];  Service: Cardiovascular;  Laterality: N/A;   APPENDECTOMY     BIOPSY  02/03/2019   Procedure: BIOPSY;  Surgeon: Ladene Artist, MD;  Location: [facility];  Service: Endoscopy;;   BREAST BIOPSY Left 2016   CARDIAC CATHETERIZATION  11/29/05   EF of 55%   CARDIAC CATHETERIZATION  08/06/06   EF of 45-50%   CARDIAC CATHETERIZATION N/A 05/06/2015   Procedure: Right/Left Heart Cath and Coronary/Graft Angiography;  Surgeon: Peter M Martinique, MD;  Location: [place] [facility];  Service: Cardiovascular;  Laterality: N/A;   CAROTID PTA/STENT INTERVENTION N/A 10/10/2017   Procedure: CAROTID PTA/STENT INTERVENTION - Right;   Surgeon: Serafina Mitchell, MD;  Location: [place] [facility];  Service: Cardiovascular;  Laterality: N/A;   CERVICAL FUSION  1990   CHOLECYSTECTOMY     COLON RESECTION     mass removed and 4 in of colon   COLONOSCOPY WITH PROPOFOL N/A 07/16/2017   Procedure: COLONOSCOPY WITH PROPOFOL;  Surgeon: Ladene Artist, MD;  [facility];  Service: Endoscopy;  Laterality: N/A;   CORONARY ARTERY BYPASS GRAFT  12/04/2005   x5 -- left internal mammary artery to the LAD, left radial artery to the ramus intermedius, saphenous vein graft to the obtuse marginal 1, sequential saphenous vein grat to the acute marginal and posterior descending, endoscopic vein harvesting from the left thigh with open vein harvest from right leg   CORONARY STENT PLACEMENT  08/11/06   PCI of her ciurcumflex/OM vessel   ESOPHAGOGASTRODUODENOSCOPY (EGD) WITH PROPOFOL N/A 02/03/2019   Procedure: ESOPHAGOGASTRODUODENOSCOPY (EGD) WITH PROPOFOL;  Surgeon: Ladene Artist, MD;  [facility];  Service: Endoscopy;  Laterality: N/A;   LAPAROTOMY Bilateral 05/19/2015   Procedure: EXPLORATORY LAPAROTOMY WITH BILATERAL SALPINGO OOPHORECTOMY /OMENTECTOMY/SEGMENTAL SIGMOID COLECTOMY ;  Surgeon: Everitt Amber, MD;  Location: [facility];  Service: Gynecology;  Laterality: Bilateral;   PERIPHERAL VASCULAR BALLOON ANGIOPLASTY  04/18/2020   Procedure: PERIPHERAL VASCULAR BALLOON ANGIOPLASTY;  Surgeon: Lorretta Harp, MD;  Location: [place];  Service: Cardiovascular;;  rt SFA  atherectomy and DCB   PERIPHERAL VASCULAR CATHETERIZATION N/A 11/15/2015   Procedure: Carotid PTA/Stent Intervention;  Surgeon: Lorretta Harp, MD;  Location: [place] [facility];  Service: Cardiovascular;  Laterality: N/A;   PERIPHERAL VASCULAR CATHETERIZATION  11/15/2015   Procedure: Carotid Angiography;  Surgeon: Lorretta Harp, MD;  Location: [place] [facility];  Service: Cardiovascular;;   RIGHT HEART CATH N/A 01/27/2020   Procedure: RIGHT  HEART CATH;  Surgeon: Larey Dresser, MD;  Location: [facility];  Service: Cardiovascular;  Laterality: N/A;   Social History   Tobacco Use   Smoking status: Current Every Day Smoker    Packs/day: 1.00    Years: 51.00    Pack years: 51.00    Types: Cigarettes    Start date: 27    Last attempt to quit: 02/22/2020    Years since quitting: 0.2   Smokeless tobacco: Never Used  Vaping Use   Vaping Use: Former  Substance Use Topics   Alcohol use: No   Drug use: No   Family History  Problem Relation Age of Onset   Heart disease Mother    Diabetes Mother    COPD Mother  Hyperlipidemia Mother    Hypertension Mother    Cancer Father        met, origin unknown   Heart attack Father    Drug abuse Paternal Grandmother    Stroke Paternal Grandfather    Lung cancer Paternal Aunt        lung with mets to brain   Melanoma Paternal Uncle    Lung cancer Paternal Uncle        lung/liver to brain   Cancer Paternal Uncle        cancer of unknown type   Heart disease Maternal Grandfather    Hypertension Sister    Cancer Sister        eyelid   Glaucoma Sister    Heart disease Maternal Aunt        x 2 aunts   Allergies  Allergen Reactions   Bee Venom Itching and Swelling   Benadryl [Diphenhydramine] Other (See Comments)    Insomnia    Entresto [Sacubitril-Valsartan] Cough   Farxiga [Dapagliflozin]     Yeast infection   Tetracycline     Unknown, pt cannot recall exact reaction   Ace Inhibitors Cough   Current Outpatient Medications on File Prior to Visit  Medication Sig Dispense Refill   acetaminophen (TYLENOL) 500 MG tablet Take 1,000 mg by mouth every 6 (six) hours as needed for moderate pain or headache.     albuterol (VENTOLIN HFA) 108 (90 Base) MCG/ACT inhaler INHALER 2 PUFFS EVERY 6 HOURS AS NEEDED FOR WHEEZING/SHORTNESS OF BREATH (Patient taking differently: Inhale 2 puffs into the lungs every 6 (six) hours as needed. ) 18 g 1    aspirin EC 81 MG tablet Take 81 mg by mouth every evening.  30 tablet 6   clopidogrel (PLAVIX) 75 MG tablet Take 1 tablet (75 mg total) by mouth daily. 90 tablet 3   dexlansoprazole (DEXILANT) 60 MG capsule Take 60 mg by mouth daily.     digoxin (LANOXIN) 0.125 MG tablet TAKE 0.5 TABLETS (0.0625 MG TOTAL) BY MOUTH DAILY. 45 tablet 3   diphenhydrAMINE (BENADRYL) 25 mg capsule Take 25 mg by mouth 2 (two) times daily as needed for itching or allergies.      fluticasone (FLONASE) 50 MCG/ACT nasal spray Place 2 sprays into both nostrils daily. 48 g 1   gabapentin (NEURONTIN) 300 MG capsule TAKE 2 CAPSULES BY MOUTH EVERY DAY AT BEDTIME 180 capsule 1   glimepiride (AMARYL) 4 MG tablet Take 4 mg by mouth in the morning and at bedtime.     Glycopyrrolate-Formoterol (BEVESPI AEROSPHERE) 9-4.8 MCG/ACT AERO Inhale 2 puffs into the lungs 2 (two) times daily. 32.1 g 1   guaiFENesin (MUCINEX) 600 MG 12 hr tablet Take 2 tablets (1,200 mg total) by mouth 2 (two) times daily. 360 tablet 3   icosapent Ethyl (VASCEPA) 1 g capsule Take 2 capsules (2 g total) by mouth 2 (two) times daily. 120 capsule 3   iron polysaccharides (NU-IRON) 150 MG capsule Take 1 capsule (150 mg total) by mouth 2 (two) times daily. 180 capsule 3   isosorbide mononitrate (IMDUR) 30 MG 24 hr tablet Take 30 mg by mouth daily.      levETIRAcetam (KEPPRA) 1000 MG tablet Take 1 tablet (1,000 mg total) by mouth 2 (two) times daily. 180 tablet 3   metFORMIN (GLUCOPHAGE-XR) 500 MG 24 hr tablet Take 500 mg by mouth every evening.   6   metolazone (ZAROXOLYN) 2.5 MG tablet Take 1 tablet (2.5 mg total) by  mouth once a week. On mondays 10 tablet 4   nicotine (NICODERM CQ - DOSED IN MG/24 HOURS) 21 mg/24hr patch Place 21 mg onto the skin daily.     nicotine polacrilex (NICORETTE) 4 MG gum Take 1 each (4 mg total) by mouth as needed for smoking cessation. 90 tablet 2   nitroGLYCERIN (NITROLINGUAL) 0.4 MG/SPRAY spray Place 1 spray under the  tongue every 5 (five) minutes x 3 doses as needed for chest pain. 12 g 3   NYSTATIN powder APPLY 1 APPLICATION        TOPICALLY 4    TIMES A     DAY AS NEEDED    FOR YEAST    INFECTIONS 45 g 1   ONE TOUCH ULTRA TEST test strip USE AS DIRECTED FOR TESTING BLOOD GLUCOSE 3 TIMES DAILY  1   polyethylene glycol (MIRALAX / GLYCOLAX) 17 g packet Take 17 g by mouth daily.     potassium chloride SA (KLOR-CON) 20 MEQ tablet Take 1 tablet (20 mEq total) by mouth 2 (two) times daily.     promethazine (PHENERGAN) 25 MG tablet Take 1 tablet (25 mg total) by mouth every 8 (eight) hours as needed for nausea or vomiting. Caution of sedation 30 tablet 1   ranolazine (RANEXA) 1000 MG SR tablet Take 1 tablet (1,000 mg total) by mouth 2 (two) times daily. 180 tablet 3   rosuvastatin (CRESTOR) 40 MG tablet Take 1 tablet (40 mg total) by mouth daily. (Patient taking differently: Take 40 mg by mouth daily. Takes 2 tablets at bedtime) 90 tablet 3   sodium chloride (OCEAN) 0.65 % SOLN nasal spray Place 1 spray into both nostrils as needed for congestion.     spironolactone (ALDACTONE) 25 MG tablet Take 1 tablet (25 mg total) by mouth at bedtime. 90 tablet 3   torsemide (DEMADEX) 20 MG tablet Take 3 tablets (60 mg total) by mouth every morning AND 2 tablets (40 mg total) every evening. 150 tablet 4   TRESIBA FLEXTOUCH 100 UNIT/ML FlexTouch Pen Inject 35 Units into the skin daily.      No current facility-administered medications on file prior to visit.    Review of Systems  Constitutional: Negative for activity change, appetite change, fatigue, fever and unexpected weight change.  HENT: Negative for congestion, ear pain, rhinorrhea, sinus pressure and sore throat.   Eyes: Negative for pain, redness and visual disturbance.  Respiratory: Negative for cough, shortness of breath and wheezing.   Cardiovascular: Negative for chest pain and palpitations.  Gastrointestinal: Negative for abdominal pain, blood in stool,  constipation and diarrhea.  Endocrine: Negative for polydipsia and polyuria.  Genitourinary: Negative for dysuria, frequency and urgency.  Musculoskeletal: Positive for arthralgias, back pain, gait problem and myalgias. Negative for joint swelling.       Recent inc in statin dose  Skin: Negative for pallor and rash.  Allergic/Immunologic: Negative for environmental allergies.  Neurological: Negative for dizziness, syncope and headaches.  Hematological: Negative for adenopathy. Does not bruise/bleed easily.  Psychiatric/Behavioral: Negative for decreased concentration and dysphoric mood. The patient is not nervous/anxious.        Objective:   Physical Exam Constitutional:      General: She is not in acute distress.    Appearance: Normal appearance. She is normal weight. She is not ill-appearing.  HENT:     Mouth/Throat:     Mouth: Mucous membranes are moist.  Eyes:     General: No scleral icterus.    Conjunctiva/sclera:  Conjunctivae normal.     Pupils: Pupils are equal, round, and reactive to light.  Cardiovascular:     Rate and Rhythm: Normal rate and regular rhythm.     Pulses: Normal pulses.  Pulmonary:     Effort: Pulmonary effort is normal.     Breath sounds: Normal breath sounds.     Comments: Diffusely distant bs No wheeze Abdominal:     General: Abdomen is flat. Bowel sounds are normal. There is no distension.     Palpations: Abdomen is soft. There is no mass.  Musculoskeletal:     Cervical back: Normal range of motion and neck supple. No rigidity or tenderness.     Lumbar back: Bony tenderness present.     Right hip: Bony tenderness present. No crepitus. Decreased range of motion. Normal strength.     Right knee: Crepitus present. No effusion, erythema, ecchymosis or lacerations. Decreased range of motion. Tenderness present. Normal pulse.     Right lower leg: No edema.     Left lower leg: No edema.     Comments: Some spasm and low LS tendernes  R knee-stable with  no lachman/drawer test abn  R hip-hurts primarily with external rotation  Some mild greater trochanter tendernese  Lymphadenopathy:     Cervical: No cervical adenopathy.  Neurological:     Mental Status: She is alert.           Assessment & Plan:   Problem List Items Addressed This Visit      Other   Acute pain of right lower extremity - Primary    With knee and groin pain (this is her dominant side)  Limited opt for pain - cannot take nsaids Tylenol not very affective Would avoid tramadol due to seizure history  Not intereste iin narcodic at this time  Given ps for cyclobenazprine to try 5-10 mg at bedtime with great caution of sedation and falls  Xray of hip and knee today-pend rad rev      Right knee pain    Xray now       Relevant Orders   DG Knee Complete 4 Views Right   Right groin pain    Xray now  Worse with ext rota of ips       Relevant Orders   DG Hip Unilat W OR W/O Pelvis 2-3 Views Right

## 2020-05-16 NOTE — Assessment & Plan Note (Signed)
Xray now

## 2020-05-16 NOTE — Assessment & Plan Note (Signed)
With knee and groin pain (this is her dominant side)  Limited opt for pain - cannot take nsaids Tylenol not very affective Would avoid tramadol due to seizure history  Not intereste iin narcodic at this time  Given ps for cyclobenazprine to try 5-10 mg at bedtime with great caution of sedation and falls  Xray of hip and knee today-pend rad rev

## 2020-05-26 ENCOUNTER — Ambulatory Visit (HOSPITAL_COMMUNITY)
Admission: RE | Admit: 2020-05-26 | Discharge: 2020-05-26 | Disposition: A | Discharge status: Home / Self Care | Payer: Medicare Other | Source: Ambulatory Visit | Attending: Cardiology | Admitting: Cardiology

## 2020-05-26 ENCOUNTER — Other Ambulatory Visit: Payer: Self-pay

## 2020-05-26 VITALS — BP 120/75 | HR 103 | Wt 126.4 lb

## 2020-05-26 DIAGNOSIS — Z8249 Family history of ischemic heart disease and other diseases of the circulatory system: Secondary | ICD-10-CM | POA: Insufficient documentation

## 2020-05-26 DIAGNOSIS — I255 Ischemic cardiomyopathy: Secondary | ICD-10-CM | POA: Insufficient documentation

## 2020-05-26 DIAGNOSIS — E785 Hyperlipidemia, unspecified: Secondary | ICD-10-CM | POA: Insufficient documentation

## 2020-05-26 DIAGNOSIS — Z951 Presence of aortocoronary bypass graft: Secondary | ICD-10-CM | POA: Insufficient documentation

## 2020-05-26 DIAGNOSIS — I25708 Atherosclerosis of coronary artery bypass graft(s), unspecified, with other forms of angina pectoris: Secondary | ICD-10-CM | POA: Diagnosis not present

## 2020-05-26 DIAGNOSIS — I2510 Atherosclerotic heart disease of native coronary artery without angina pectoris: Secondary | ICD-10-CM | POA: Insufficient documentation

## 2020-05-26 DIAGNOSIS — Z7902 Long term (current) use of antithrombotics/antiplatelets: Secondary | ICD-10-CM | POA: Diagnosis not present

## 2020-05-26 DIAGNOSIS — Z794 Long term (current) use of insulin: Secondary | ICD-10-CM | POA: Insufficient documentation

## 2020-05-26 DIAGNOSIS — G4733 Obstructive sleep apnea (adult) (pediatric): Secondary | ICD-10-CM | POA: Diagnosis not present

## 2020-05-26 DIAGNOSIS — Z79899 Other long term (current) drug therapy: Secondary | ICD-10-CM | POA: Insufficient documentation

## 2020-05-26 DIAGNOSIS — I110 Hypertensive heart disease with heart failure: Secondary | ICD-10-CM | POA: Diagnosis not present

## 2020-05-26 DIAGNOSIS — I5022 Chronic systolic (congestive) heart failure: Secondary | ICD-10-CM | POA: Diagnosis not present

## 2020-05-26 DIAGNOSIS — E119 Type 2 diabetes mellitus without complications: Secondary | ICD-10-CM | POA: Diagnosis not present

## 2020-05-26 DIAGNOSIS — I7090 Unspecified atherosclerosis: Secondary | ICD-10-CM | POA: Diagnosis not present

## 2020-05-26 DIAGNOSIS — Z955 Presence of coronary angioplasty implant and graft: Secondary | ICD-10-CM | POA: Diagnosis not present

## 2020-05-26 DIAGNOSIS — G40909 Epilepsy, unspecified, not intractable, without status epilepticus: Secondary | ICD-10-CM | POA: Diagnosis not present

## 2020-05-26 DIAGNOSIS — Z7982 Long term (current) use of aspirin: Secondary | ICD-10-CM | POA: Insufficient documentation

## 2020-05-26 DIAGNOSIS — I739 Peripheral vascular disease, unspecified: Secondary | ICD-10-CM | POA: Diagnosis not present

## 2020-05-26 DIAGNOSIS — I951 Orthostatic hypotension: Secondary | ICD-10-CM | POA: Insufficient documentation

## 2020-05-26 DIAGNOSIS — Z8673 Personal history of transient ischemic attack (TIA), and cerebral infarction without residual deficits: Secondary | ICD-10-CM | POA: Diagnosis not present

## 2020-05-26 DIAGNOSIS — F17210 Nicotine dependence, cigarettes, uncomplicated: Secondary | ICD-10-CM | POA: Insufficient documentation

## 2020-05-26 DIAGNOSIS — R0789 Other chest pain: Secondary | ICD-10-CM | POA: Insufficient documentation

## 2020-05-26 LAB — DIGOXIN LEVEL: Digoxin Level: 0.4 ng/mL — ABNORMAL LOW (ref 0.8–2.0)

## 2020-05-26 LAB — BASIC METABOLIC PANEL
Anion gap: 12 (ref 5–15)
BUN: 25 mg/dL — ABNORMAL HIGH (ref 8–23)
CO2: 26 mmol/L (ref 22–32)
Calcium: 9.5 mg/dL (ref 8.9–10.3)
Chloride: 97 mmol/L — ABNORMAL LOW (ref 98–111)
Creatinine, Ser: 0.79 mg/dL (ref 0.44–1.00)
GFR calc Af Amer: >60 mL/min (ref >60)
GFR calc non Af Amer: >60 mL/min (ref >60)
Glucose, Bld: 327 mg/dL — ABNORMAL HIGH (ref 70–99)
Potassium: 3.4 mmol/L — ABNORMAL LOW (ref 3.5–5.1)
Sodium: 135 mmol/L (ref 135–145)

## 2020-05-26 MED ORDER — ROSUVASTATIN CALCIUM 20 MG PO TABS
20.0000 mg | ORAL_TABLET | Freq: Every day | ORAL | 6 refills | Status: DC
Start: 2020-05-26 — End: 2021-02-17

## 2020-05-26 MED ORDER — METOLAZONE 2.5 MG PO TABS
2.5000 mg | ORAL_TABLET | ORAL | 4 refills | Status: DC
Start: 2020-05-26 — End: 2020-11-17

## 2020-05-26 MED ORDER — VERQUVO 2.5 MG PO TABS: 2.5000 mg | ORAL_TABLET | Freq: Every day | ORAL | 6 refills | Status: DC

## 2020-05-26 MED ORDER — EZETIMIBE 10 MG PO TABS: 10.0000 mg | ORAL_TABLET | Freq: Every day | ORAL | 6 refills | Status: DC

## 2020-05-26 NOTE — Patient Instructions (Signed)
Increase Metolazone to 2.5 mg EVERY Monday and Friday  Decrease Crestor to 20 mg Daily  Start Zetia 10 mg Daily  Start Verquvo 2.5 mg Daily  Labs done today, your results will be available in MyChart, we will contact you for abnormal readings.  You have been referred to EP to discuss getting a Defibrillator, their office will call you to schedule this  You have been referred to Dr Mosetta Pigeon at [facility] for a second opinion for an LVAD, his office will call you to schedule this  Your physician recommends that you schedule a follow-up appointment in: 2 months  If you have any questions or concerns before your next appointment please send [place] a message through [place] or call our office at [phone].    TO LEAVE A MESSAGE FOR THE NURSE SELECT OPTION 2, PLEASE LEAVE A MESSAGE INCLUDING: . YOUR NAME . DATE OF BIRTH . CALL BACK NUMBER . REASON FOR CALL**this is important as we prioritize the call backs  [place] AS LONG AS YOU CALL BEFORE 4:00 PM  At the [facility], you and your health needs are our priority. As part of our continuing mission to provide you with exceptional heart care, we have created designated Provider Care Teams. These Care Teams include your primary Cardiologist (physician) and Advanced Practice Providers (APPs- Physician Assistants and Nurse Practitioners) who all work together to provide you with the care you need, when you need it.   You may see any of the following providers on your designated Care Team at your next follow up: [name] Dr Glori Bickers . Dr Loralie Champagne . Darrick Grinder, NP . Lyda Jester, PA . Audry Riles, PharmD   Please be sure to bring in all your medications bottles to every appointment.

## 2020-05-26 NOTE — Progress Notes (Signed)
PCP: [name], MD Cardiology: Dr. Martinique HF Cardiology: Dr. Aundra Dubin  64 y.o. with history of CAD, ischemic cardiomyopathy, CVA, and valvular disease was referred by Dr. Martinique for CHF evaluation.  Patient has an extensive history of vascular disease.  She had CABG in 2007.  Repeat LHC in 2007 showed all grafts closed except LIMA-LAD.  Patient had DES to LCx/OM.  [name] 2016 showed occlusion of DES to LCx/OM with L-L and L-R collaterals. Poor targets for redo CABG.   In 2/21, echo was done showing EF 20-25% with moderate RV dysfunction, moderate-severe MR, moderate TR.  Despite smoking history, PFTs in 2019 looked relatively normal. She additionally had the placement of a carotid stent on the right in 1/19.    RHC was done in 4/21, showing near-normal filling pressures with CI 2.12 by Fick.  CPX in 5/21 showed peak VO2 13, VE/VCO2 slope 37 => moderate HF limitation.  The PFTs on the CPX were not markedly abnormal. She saw Dr. Gwenlyn Found recently, peripheral arterial dopplers in 4/21 showed significant stenosis right SFA.   We worked her up for LVAD.  She was presented at [facility] and saw Dr. Prescott Gum.  He was concerned by her small stature and small LV cavity. Concern that Heartmate 3 would not function well in her small LV, and Heartware LVAD that was thought to be more suitable for small patients has been discontinued.   We considered her for a barostimulation trial, but she does not qualify for either ANTHEM or Batwire due to carotid disease.   In 7/21, she had atherectomy of right SFA.    Patient returns for followup of CHF.  She is off dapagliflozin due to recurrrent GU yeast infections. She is tolerating spironolactone 25 mg daily without excessive lightheadedness.  She still has generalized weakness and is short of breath after walking 50-100 feet with her walker, some days better and some days worse.  Despite right SFA atherectomy and improved pulses, she still has pain throughout her right leg.  She  says this really started when she increased her Crestor to 40 mg daily. She has some atypical chest pain in the evening, generally while at rest.  She takes NTG a couple of times/week.  No exertional dyspnea.                  Labs (4/21): K 3.7 => 3.4, creatinine 0.78 => 0.7, hgb 11.3 => 14.4 Labs (5/21): digoxin 0.6, K 3.5, creatinine 0.63 Labs (6/21): K 4.1, creatinine 0.7, LDL 61, TGs 247 Labs (7/21): K 5, creatinine 0.76, hgb 16.9.   PMH: 1. CAD: CABG 2007.  - LHC in 2007 showed all grafts closed except LIMA-LAD.  Patient had DES to LCx/OM.   - [place] 2016 showed occlusion of DES to LCx/OM with L-L and L-R collaterals.  - Not candidate for redo CABG with poor targets.  2. Chronic systolic CHF: Ischemic cardiomyopathy.   - Echo (2/21): EF 20-25%, moderately dilated and moderately dysfunctional RV, PASP 49, severe biatrial enlargement, moderate-severe MR, severe TR, mild-moderate AS, IVC dilated.  - RHC (4/21): mean RA 6, PA 41/16, mean 29, mean PCWP 15, CI 2.12, PVR 4.3 WU - CPX (5/21): peak VO2 13, VE/VCO2 slope 37, RER 1.06 => moderate HF limitation.  3. Fe deficiency anemia: FOBT negative.  4. Carotid stenosis: Right carotid stent in 1/19.  - Carotid dopplers (2/45): 80-99% LICA stenosis.  - Carotid dopplers (8/33): 82-50% LICA stenosis.  5. H/o CVA 6. HTN 7.  Type 2 diabetes 8. Hyperlipidemia 9. Seizure disorder 10. OSA: Cannot tolerate CPAP.  11. Active smoker: PFTs in 2019 were relatively normal.  She does use oxygen at night.  - PFTs (5/21): FEV1 82%, FVC 80%, ratio 102% => minimal obstruction.  12. PAD: ABIs (4/21) 0.76 on right, 0.86 on left => significant right SFA stenosis.  - Atherectomy/angioplasty right SFA.  13. Chronic penetrating atherosclerotic ulcer descending thoracic aorta.   Social History   Socioeconomic History  . Marital status: Divorced    Spouse name: Not on file  . Number of children: 0  . Years of education: Not on file  . Highest education level:  Not on file  Occupational History  . Occupation: disabled  Tobacco Use  . Smoking status: Current Every Day Smoker    Packs/day: 1.00    Years: 51.00    Pack years: 51.00    Types: Cigarettes    Start date: 38    Last attempt to quit: 02/22/2020    Years since quitting: 0.2  . Smokeless tobacco: Never Used  Vaping Use  . Vaping Use: Former  Substance and Sexual Activity  . Alcohol use: No  . Drug use: No  . Sexual activity: Not Currently    Partners: Male    Birth control/protection: None  Other Topics Concern  . Not on file  Social History Narrative   Right handed   Lives alone in a one story home   Social Determinants of Health   Financial Resource Strain:   . Difficulty of Paying Living Expenses: Not on file  Food Insecurity:   . Worried About Charity fundraiser in the Last Year: Not on file  . Ran Out of Food in the Last Year: Not on file  Transportation Needs:   . Lack of Transportation (Medical): Not on file  . Lack of Transportation (Non-Medical): Not on file  Physical Activity:   . Days of Exercise per Week: Not on file  . Minutes of Exercise per Session: Not on file  Stress:   . Feeling of Stress : Not on file  Social Connections:   . Frequency of Communication with Friends and Family: Not on file  . Frequency of Social Gatherings with Friends and Family: Not on file  . Attends Religious Services: Not on file  . Active Member of Clubs or Organizations: Not on file  . Attends Archivist Meetings: Not on file  . Marital Status: Not on file  Intimate Partner Violence:   . Fear of Current or Ex-Partner: Not on file  . Emotionally Abused: Not on file  . Physically Abused: Not on file  . Sexually Abused: Not on file   Family History  Problem Relation Age of Onset  . Heart disease Mother   . Diabetes Mother   . COPD Mother   . Hyperlipidemia Mother   . Hypertension Mother   . Cancer Father        met, origin unknown  . Heart attack Father    . Drug abuse Paternal Grandmother   . Stroke Paternal Grandfather   . Lung cancer Paternal Aunt        lung with mets to brain  . Melanoma Paternal Uncle   . Lung cancer Paternal Uncle        lung/liver to brain  . Cancer Paternal Uncle        cancer of unknown type  . Heart disease Maternal Grandfather   . Hypertension Sister   .  Cancer Sister        eyelid  . Glaucoma Sister   . Heart disease Maternal Aunt        x 2 aunts   ROS: All systems reviewed and negative except as per HPI.   Current Outpatient Medications  Medication Sig Dispense Refill  . acetaminophen (TYLENOL) 500 MG tablet Take 1,000 mg by mouth every 6 (six) hours as needed for moderate pain or headache.    . albuterol (VENTOLIN HFA) 108 (90 Base) MCG/ACT inhaler Inhale into the lungs every 6 (six) hours as needed for wheezing or shortness of breath.    [name] aspirin EC 81 MG tablet Take 81 mg by mouth every evening.  30 tablet 6  . clopidogrel (PLAVIX) 75 MG tablet Take 1 tablet (75 mg total) by mouth daily. 90 tablet 3  . cyclobenzaprine (FLEXERIL) 10 MG tablet Take 0.5-1 tablets (5-10 mg total) by mouth at bedtime. For pain 30 tablet 1  . dexlansoprazole (DEXILANT) 60 MG capsule Take 60 mg by mouth daily.    . digoxin (LANOXIN) 0.125 MG tablet TAKE 0.5 TABLETS (0.0625 MG TOTAL) BY MOUTH DAILY. 45 tablet 3  . diphenhydrAMINE (BENADRYL) 25 mg capsule Take 25 mg by mouth 2 (two) times daily as needed for itching or allergies.     . fluticasone (FLONASE) 50 MCG/ACT nasal spray Place 2 sprays into both nostrils daily. 48 g 1  . gabapentin (NEURONTIN) 300 MG capsule TAKE 2 CAPSULES BY MOUTH EVERY DAY AT BEDTIME 180 capsule 1  . glimepiride (AMARYL) 4 MG tablet Take 4 mg by mouth in the morning and at bedtime.    . Glycopyrrolate-Formoterol (BEVESPI AEROSPHERE) 9-4.8 MCG/ACT AERO Inhale 2 puffs into the lungs 2 (two) times daily. 32.1 g 1  . guaiFENesin (MUCINEX) 600 MG 12 hr tablet Take 2 tablets (1,200 mg total) by mouth  2 (two) times daily. 360 tablet 3  . icosapent Ethyl (VASCEPA) 1 g capsule Take 2 capsules (2 g total) by mouth 2 (two) times daily. 120 capsule 3  . iron polysaccharides (NU-IRON) 150 MG capsule Take 1 capsule (150 mg total) by mouth 2 (two) times daily. 180 capsule 3  . isosorbide mononitrate (IMDUR) 30 MG 24 hr tablet Take 30 mg by mouth daily.     [name] levETIRAcetam (KEPPRA) 1000 MG tablet Take 1 tablet (1,000 mg total) by mouth 2 (two) times daily. 180 tablet 3  . metFORMIN (GLUCOPHAGE-XR) 500 MG 24 hr tablet Take 500 mg by mouth every evening.   6  . metolazone (ZAROXOLYN) 2.5 MG tablet Take 1 tablet (2.5 mg total) by mouth 2 (two) times a week. Every Mon and Fri 15 tablet 4  . nicotine (NICODERM CQ - DOSED IN MG/24 HOURS) 21 mg/24hr patch Place 21 mg onto the skin daily.    . nicotine polacrilex (NICORETTE) 4 MG gum Take 1 each (4 mg total) by mouth as needed for smoking cessation. 90 tablet 2  . nitroGLYCERIN (NITROLINGUAL) 0.4 MG/SPRAY spray Place 1 spray under the tongue every 5 (five) minutes x 3 doses as needed for chest pain. 12 g 3  . NYSTATIN powder APPLY 1 APPLICATION        TOPICALLY 4    TIMES A     DAY AS NEEDED    FOR YEAST    INFECTIONS 45 g 1  . ONE TOUCH ULTRA TEST test strip USE AS DIRECTED FOR TESTING BLOOD GLUCOSE 3 TIMES DAILY  1  . polyethylene glycol (MIRALAX /  GLYCOLAX) 17 g packet Take 17 g by mouth daily.    . potassium chloride SA (KLOR-CON) 20 MEQ tablet Take 1 tablet (20 mEq total) by mouth 2 (two) times daily.    . promethazine (PHENERGAN) 25 MG tablet Take 1 tablet (25 mg total) by mouth every 8 (eight) hours as needed for nausea or vomiting. Caution of sedation 30 tablet 1  . ranolazine (RANEXA) 1000 MG SR tablet Take 1 tablet (1,000 mg total) by mouth 2 (two) times daily. 180 tablet 3  . rosuvastatin (CRESTOR) 20 MG tablet Take 1 tablet (20 mg total) by mouth daily. 30 tablet 6  . sodium chloride (OCEAN) 0.65 % SOLN nasal spray Place 1 spray into both nostrils as  needed for congestion.    [name] spironolactone (ALDACTONE) 25 MG tablet Take 1 tablet (25 mg total) by mouth at bedtime. 90 tablet 3  . torsemide (DEMADEX) 20 MG tablet Take 3 tablets (60 mg total) by mouth every morning AND 2 tablets (40 mg total) every evening. 150 tablet 4  . TRESIBA FLEXTOUCH 100 UNIT/ML FlexTouch Pen Inject 35 Units into the skin daily.     [name] ezetimibe (ZETIA) 10 MG tablet Take 1 tablet (10 mg total) by mouth daily. 30 tablet 6  . Vericiguat (VERQUVO) 2.5 MG TABS Take 2.5 mg by mouth daily. 30 tablet 6   No current facility-administered medications for this encounter.   BP 120/75   Pulse (!) 103   Wt 57.3 kg (126 lb 6.4 oz)   LMP 09/02/2003   SpO2 97%   BMI 27.35 kg/m  General: NAD Neck: No JVD, no thyromegaly or thyroid nodule.  Lungs: Clear to auscultation bilaterally with normal respiratory effort. CV: Nondisplaced PMI.  Heart regular S1/S2, no S3/S4, 2/6 SEM RUSB.  No peripheral edema.  No carotid bruit.  Difficult to palpate pedal pulses.  Abdomen: Soft, nontender, no hepatosplenomegaly, no distention.  Skin: Intact without lesions or rashes.  Neurologic: Alert and oriented x 3.  Psych: Normal affect. Extremities: No clubbing or cyanosis.  HEENT: Normal.   Assessment/Plan:  1. Chronic systolic CHF: Ischemic cardiomyopathy.  Echo in 2/21 showed EF 20-25%, moderately dilated and moderately dysfunctional RV, PASP 49, severe biatrial enlargement, moderate-severe MR, severe TR, mild-moderate AS, IVC dilated. CPX (5/21) with moderate HF limitation, RHC (4/21) with CI low at 2.12.  Medication titration has been severely limited by hypotension and orthostatic symptoms.  NYHA class III-IV symptoms, very limited chronically.  She does not marked volume overload on exam, but she feels like she does better when she takes metolazone twice a week rather than once a week.  - Continue digoxin 0.0625 mg daily, check level today.   - Continue torsemide 60 qam/40 qpm.  Increase  metolazone to 2.5 mg bid, Monday/Friday.  BMET today and 10 days.  - Continue spironolactone 25 mg daily.  - She has not tolerated BP-active meds well, avoid Coreg and Entresto for now.  She became hypotensive with losartan.  - She did not tolerate SGLT2 inhibitor due to recurrent GU yeast infections.  - I will start her on vericiguat 2.5 mg daily.  - As above, she does not qualify for the barostimulation trials due to carotid disease.  - She is fairly stable symptomatically.  I will have her evaluated by EP for ICD. She would not qualify for CRT with narrow QRS.  - NYHA class III-IV symptoms with marginal cardiac index (2.1).  Moderate HF limitation on CPX.  Medication titration limited by  orthostatic symptoms.  We evaluated her for LVAD.  However, with small stature and small LV cavity, thought by surgeon not to be a good HM3 candidate (also would be redo sternotomy).  Heartware no longer available. I will have her get a 2nd opinion regarding LVAD placement at [place] with Dr. Mosetta Pigeon.   2. CAD: s/p CABG 8648 complicated by early graft closure.  Repeat cath in 2007 showed only LIMA-LAD still open and she had DES to LCX/OM.  Cath in 2016 showed this stent was occluded.  She has not had targets for redo CABG and she does not have good interventional targets. She has occasional atypical chest pain. - Continue ASA 81, Plavix 75 mg daily.  - Continue ranolazone 1000 bid.  - Continue Imdur 30 mg daily, unable to tolerate higher dose due to orthostatic symptoms.  - She thinks increasing Crestor made her leg pain worse.  Decrease Crestor to 20 mg daily and add Zetia 10 mg daily.  Lipids/LFTs 2 months. .  3. Smoking: She quit recently.     4. PAD: s/p atherectomy/angioplasty right SFA in 7/21.  - Followup with Dr. Gwenlyn Found.   5. Carotid stenosis: Moderate stenosis, repeat dopplers 6/22.   6. Fe deficiency anemia: Has been FOBT negative.  Has had Ferahema.  Most recent hgb looks stable.   1 month followup.     Diane [name] 05/26/2020

## 2020-05-26 NOTE — Progress Notes (Signed)
Medication Samples have been provided to the patient.  Drug name: Truett Mainland       Strength: 5mg         Qty: 1 bottle  LOT: [id number]  Exp.Date: 11/03/21  Dosing instructions: Take 1/2 tablet Daily  The patient has been instructed regarding the correct time, dose, and frequency of taking this medication, including desired effects and most common side effects.   Malena Edman 3:27 PM 05/26/2020

## 2020-05-27 ENCOUNTER — Telehealth (HOSPITAL_COMMUNITY): Payer: Self-pay | Admitting: Pharmacy Technician

## 2020-05-27 NOTE — Telephone Encounter (Signed)
Patient Advocate Encounter   Received notification from [facility] that prior authorization for Diane [name] is required.   PA submitted on CoverMyMeds Key [id number] Status is pending   Will continue to follow.

## 2020-05-29 ENCOUNTER — Other Ambulatory Visit (HOSPITAL_COMMUNITY): Payer: Self-pay | Admitting: Cardiology

## 2020-05-29 ENCOUNTER — Other Ambulatory Visit: Payer: Self-pay | Admitting: Pulmonary Disease

## 2020-05-29 DIAGNOSIS — J9611 Chronic respiratory failure with hypoxia: Secondary | ICD-10-CM

## 2020-05-30 ENCOUNTER — Telehealth: Payer: Self-pay | Admitting: Family Medicine

## 2020-05-30 ENCOUNTER — Other Ambulatory Visit (HOSPITAL_COMMUNITY): Payer: Self-pay | Admitting: *Deleted

## 2020-05-30 DIAGNOSIS — E1169 Type 2 diabetes mellitus with other specified complication: Secondary | ICD-10-CM

## 2020-05-30 DIAGNOSIS — D509 Iron deficiency anemia, unspecified: Secondary | ICD-10-CM

## 2020-05-30 DIAGNOSIS — I10 Essential (primary) hypertension: Secondary | ICD-10-CM

## 2020-05-30 DIAGNOSIS — E538 Deficiency of other specified B group vitamins: Secondary | ICD-10-CM

## 2020-05-30 MED ORDER — VERQUVO 2.5 MG PO TABS: 2.5000 mg | ORAL_TABLET | Freq: Every day | ORAL | 6 refills | Status: DC

## 2020-05-30 MED FILL — VERQUVO 2.5 MG TABS: 2.5 | 30 days supply | Qty: 30 | Fill #0

## 2020-05-30 NOTE — Telephone Encounter (Signed)
-----   Message from Ellamae Sia sent at 05/16/2020 10:54 AM EDT ----- Regarding: Lab orders for Tuesday, 8.31.21  AWV lab orders, please.

## 2020-05-30 NOTE — Telephone Encounter (Signed)
Advanced Heart Failure Patient Advocate Encounter  Prior Authorization for Diane [name] has been approved.    Effective dates: 02/27/20 through 05/27/21  Patients co-pay is $0.00  Called and spoke with the patient. We are going to mail the prescription from [facility] to her home. Will call her when it comes in and is ready to ship.  She had a question about her Spironolactone refills. I called [facility] to confirm they had the right prescription. She got a 90 day supply in June. She isn't due for a refill until towards the end of September. Called and updated patient.

## 2020-05-31 ENCOUNTER — Ambulatory Visit: Payer: Medicare Other

## 2020-05-31 ENCOUNTER — Telehealth: Payer: Self-pay

## 2020-05-31 ENCOUNTER — Other Ambulatory Visit (INDEPENDENT_AMBULATORY_CARE_PROVIDER_SITE_OTHER): Payer: Medicare Other

## 2020-05-31 ENCOUNTER — Other Ambulatory Visit: Payer: Self-pay

## 2020-05-31 DIAGNOSIS — E538 Deficiency of other specified B group vitamins: Secondary | ICD-10-CM | POA: Diagnosis not present

## 2020-05-31 DIAGNOSIS — I10 Essential (primary) hypertension: Secondary | ICD-10-CM

## 2020-05-31 DIAGNOSIS — D509 Iron deficiency anemia, unspecified: Secondary | ICD-10-CM

## 2020-05-31 DIAGNOSIS — E1169 Type 2 diabetes mellitus with other specified complication: Secondary | ICD-10-CM | POA: Diagnosis not present

## 2020-05-31 DIAGNOSIS — E785 Hyperlipidemia, unspecified: Secondary | ICD-10-CM | POA: Diagnosis not present

## 2020-05-31 LAB — CBC WITH DIFFERENTIAL/PLATELET
Basophils Absolute: 0 10*3/uL (ref 0.0–0.1)
Basophils Relative: 0.4 % (ref 0.0–3.0)
Eosinophils Absolute: 0.1 10*3/uL (ref 0.0–0.7)
Eosinophils Relative: 0.5 % (ref 0.0–5.0)
HCT: 44 % (ref 36.0–46.0)
Hemoglobin: 15.4 g/dL — ABNORMAL HIGH (ref 12.0–15.0)
Lymphocytes Relative: 16.2 % (ref 12.0–46.0)
Lymphs Abs: 1.7 10*3/uL (ref 0.7–4.0)
MCHC: 35.1 g/dL (ref 30.0–36.0)
MCV: 91 fl (ref 78.0–100.0)
Monocytes Absolute: 0.7 10*3/uL (ref 0.1–1.0)
Monocytes Relative: 6.9 % (ref 3.0–12.0)
Neutro Abs: 7.8 10*3/uL — ABNORMAL HIGH (ref 1.4–7.7)
Neutrophils Relative %: 76 % (ref 43.0–77.0)
Platelets: 260 10*3/uL (ref 150.0–400.0)
RBC: 4.83 Mil/uL (ref 3.87–5.11)
RDW: 14.4 % (ref 11.5–15.5)
WBC: 10.2 10*3/uL (ref 4.0–10.5)

## 2020-05-31 LAB — COMPREHENSIVE METABOLIC PANEL
ALT: 12 U/L (ref 0–35)
AST: 17 U/L (ref 0–37)
Albumin: 4.5 g/dL (ref 3.5–5.2)
Alkaline Phosphatase: 69 U/L (ref 39–117)
BUN: 33 mg/dL — ABNORMAL HIGH (ref 6–23)
CO2: 34 mEq/L — ABNORMAL HIGH (ref 19–32)
Calcium: 10 mg/dL (ref 8.4–10.5)
Chloride: 89 mEq/L — ABNORMAL LOW (ref 96–112)
Creatinine, Ser: 0.93 mg/dL (ref 0.40–1.20)
GFR: 60.28 mL/min (ref >60.00)
Glucose, Bld: 84 mg/dL (ref 70–99)
Potassium: 3.7 mEq/L (ref 3.5–5.1)
Sodium: 133 mEq/L — ABNORMAL LOW (ref 135–145)
Total Bilirubin: 0.4 mg/dL (ref 0.2–1.2)
Total Protein: 7.1 g/dL (ref 6.0–8.3)

## 2020-05-31 LAB — FERRITIN: Ferritin: 27.4 ng/mL (ref 10.0–291.0)

## 2020-05-31 LAB — LIPID PANEL
Cholesterol: 140 mg/dL (ref 0–200)
HDL: 38 mg/dL — ABNORMAL LOW (ref >39.00)
LDL Cholesterol: 67 mg/dL (ref 0–99)
NonHDL: 101.95
Total CHOL/HDL Ratio: 4
Triglycerides: 174 mg/dL — ABNORMAL HIGH (ref 0.0–149.0)
VLDL: 34.8 mg/dL (ref 0.0–40.0)

## 2020-05-31 LAB — TSH: TSH: 1.69 u[IU]/mL (ref 0.35–4.50)

## 2020-05-31 LAB — VITAMIN B12: Vitamin B-12: 275 pg/mL (ref 211–911)

## 2020-05-31 NOTE — Telephone Encounter (Signed)
Called patient 3 times trying to complete her Medicare Wellness visit. Patient never answered. Left message on voicemail notifying patient that appointment was cancelled and to call office to reschedule or provider may complete at upcoming physical.

## 2020-06-06 NOTE — Progress Notes (Addendum)
Electrophysiology Office Note:    Date:  06/06/2020   ID:  Diane [name], DOB 1956/06/22, MRN [id number]  PCP:  [name], MD  [facility] Cardiologist:  Peter Martinique, MD  [facility] Electrophysiologist:  Vickie Epley, MD   Referring MD: Larey Dresser, MD   Chief Complaint: ICM, eval ICD  History of Present Illness:    Diane [name] is a 64 y.o. female with a hx of ischemic cardiomyopathy who presents to clinic to discuss ICD implant at the request of Dr. Aundra Dubin.  Patient has a history of coronary artery disease status post CABG in 2007 (all grafts close except LIMA to LAD), stroke, valvular heart disease (moderate to severe MR, moderate TR).  She has been previously evaluated for LVAD at [facility] and is awaiting a second opinion regarding LVAD implant from [facility].  Past Medical History:  Diagnosis Date  . Anemia   . Carotid stenosis    a. s/p right carotid stent 10/2017.  [name] Chronic systolic CHF (congestive heart failure) ([place])   . Colon polyps   . COPD (chronic obstructive pulmonary disease) ([place])   . Coronary artery disease    post CABG in 3/07 , coronary stents   . Diabetes mellitus    type 2  . Dyslipidemia   . Fibromyalgia   . GERD (gastroesophageal reflux disease)   . Headache    hx of   . Hyperlipidemia   . Hypertension   . Myocardial infarction ([place])   . Pneumonia    hx of   . Seizures ([place])    last seizure- 03/2013   . Shortness of breath dyspnea    with exertion or when fluid builds up   . Sleep apnea    used to wear a cpap- not used in 3 years   . Status post dilation of esophageal narrowing   . Stroke [facility]) 1993   problems with balance   . Systolic murmur    known mild AS and MR  . Thyroid goiter   . Tobacco abuse     Past Surgical History:  Procedure Laterality Date  . ABDOMINAL AORTOGRAM W/LOWER EXTREMITY N/A 04/18/2020   Procedure: ABDOMINAL AORTOGRAM W/LOWER EXTREMITY;  Surgeon: Lorretta Harp, MD;  Location: [place] [facility];  Service: Cardiovascular;  Laterality: N/A;  . APPENDECTOMY    . BIOPSY  02/03/2019   Procedure: BIOPSY;  Surgeon: Ladene Artist, MD;  Location: [facility];  Service: Endoscopy;;  . BREAST BIOPSY Left 2016  . CARDIAC CATHETERIZATION  11/29/05   EF of 55%  . CARDIAC CATHETERIZATION  08/06/06   EF of 45-50%  . CARDIAC CATHETERIZATION N/A 05/06/2015   Procedure: Right/Left Heart Cath and Coronary/Graft Angiography;  Surgeon: Peter M Martinique, MD;  Location: [place] [facility];  Service: Cardiovascular;  Laterality: N/A;  . CAROTID PTA/STENT INTERVENTION N/A 10/10/2017   Procedure: CAROTID PTA/STENT INTERVENTION - Right;  Surgeon: Serafina Mitchell, MD;  Location: [place] [facility];  Service: Cardiovascular;  Laterality: N/A;  . CERVICAL FUSION  1990  . CHOLECYSTECTOMY    . COLON RESECTION     mass removed and 4 in of colon  . COLONOSCOPY WITH PROPOFOL N/A 07/16/2017   Procedure: COLONOSCOPY WITH PROPOFOL;  Surgeon: Ladene Artist, MD;  [facility]: [facility];  Service: Endoscopy;  Laterality: N/A;  . CORONARY ARTERY BYPASS GRAFT  12/04/2005   x5 -- left internal mammary artery to the LAD, left radial artery to the  ramus intermedius, saphenous vein graft to the obtuse marginal 1, sequential saphenous vein grat to the acute marginal and posterior descending, endoscopic vein harvesting from the left thigh with open vein harvest from right leg  . CORONARY STENT PLACEMENT  08/11/06   PCI of her ciurcumflex/OM vessel  . ESOPHAGOGASTRODUODENOSCOPY (EGD) WITH PROPOFOL N/A 02/03/2019   Procedure: ESOPHAGOGASTRODUODENOSCOPY (EGD) WITH PROPOFOL;  Surgeon: Ladene Artist, MD;  [facility]: [facility];  Service: Endoscopy;  Laterality: N/A;  . LAPAROTOMY Bilateral 05/19/2015   Procedure: EXPLORATORY LAPAROTOMY WITH BILATERAL SALPINGO OOPHORECTOMY /OMENTECTOMY/SEGMENTAL SIGMOID COLECTOMY ;  Surgeon: Everitt Amber, MD;  Location: [facility];  Service: Gynecology;  Laterality: Bilateral;  . PERIPHERAL VASCULAR  BALLOON ANGIOPLASTY  04/18/2020   Procedure: PERIPHERAL VASCULAR BALLOON ANGIOPLASTY;  Surgeon: Lorretta Harp, MD;  Location: [place] [facility];  Service: Cardiovascular;;  rt SFA  atherectomy and DCB  . PERIPHERAL VASCULAR CATHETERIZATION N/A 11/15/2015   Procedure: Carotid PTA/Stent Intervention;  Surgeon: Lorretta Harp, MD;  Location: [place] [facility];  Service: Cardiovascular;  Laterality: N/A;  . PERIPHERAL VASCULAR CATHETERIZATION  11/15/2015   Procedure: Carotid Angiography;  Surgeon: Lorretta Harp, MD;  Location: [place] [facility];  Service: Cardiovascular;;  . RIGHT HEART CATH N/A 01/27/2020   Procedure: RIGHT HEART CATH;  Surgeon: Larey Dresser, MD;  Location: [place] [facility];  Service: Cardiovascular;  Laterality: N/A;    Current Medications: No outpatient medications have been marked as taking for the 06/07/20 encounter (Appointment) with Vickie Epley, MD.     Allergies:   Bee venom, Benadryl [diphenhydramine], Entresto [sacubitril-valsartan], Farxiga [dapagliflozin], Tetracycline, and Ace inhibitors   Social History   Socioeconomic History  . Marital status: Divorced    Spouse name: Not on file  . Number of children: 0  . Years of education: Not on file  . Highest education level: Not on file  Occupational History  . Occupation: disabled  Tobacco Use  . Smoking status: Current Every Day Smoker    Packs/day: 1.00    Years: 51.00    Pack years: 51.00    Types: Cigarettes    Start date: 73    Last attempt to quit: 02/22/2020    Years since quitting: 0.2  . Smokeless tobacco: Never Used  Vaping Use  . Vaping Use: Former  Substance and Sexual Activity  . Alcohol use: No  . Drug use: No  . Sexual activity: Not Currently    Partners: Male    Birth control/protection: None  Other Topics Concern  . Not on file  Social History Narrative   Right handed   Lives alone in a one story home   Social Determinants of Health   Financial Resource  Strain:   . Difficulty of Paying Living Expenses: Not on file  Food Insecurity:   . Worried About Charity fundraiser in the Last Year: Not on file  . Ran Out of Food in the Last Year: Not on file  Transportation Needs:   . Lack of Transportation (Medical): Not on file  . Lack of Transportation (Non-Medical): Not on file  Physical Activity:   . Days of Exercise per Week: Not on file  . Minutes of Exercise per Session: Not on file  Stress:   . Feeling of Stress : Not on file  Social Connections:   . Frequency of Communication with Friends and Family: Not on file  . Frequency of Social Gatherings with Friends and Family: Not on file  .  Attends Religious Services: Not on file  . Active Member of Clubs or Organizations: Not on file  . Attends Archivist Meetings: Not on file  . Marital Status: Not on file     Family History: The patient's family history includes COPD in her mother; Cancer in her father, paternal uncle, and sister; Diabetes in her mother; Drug abuse in her paternal grandmother; Glaucoma in her sister; Heart attack in her father; Heart disease in her maternal aunt, maternal grandfather, and mother; Hyperlipidemia in her mother; Hypertension in her mother and sister; Lung cancer in her paternal aunt and paternal uncle; Melanoma in her paternal uncle; Stroke in her paternal grandfather.  ROS:   Please see the history of present illness.    All other systems reviewed and are negative.  EKGs/Labs/Other Studies Reviewed:    The following studies were reviewed today: Echo  11/17/2019 Echo 1. Left ventricular ejection fraction, by estimation, is 20 to 25%. Left  ventricular ejection fraction by 3D volume is 23 %. The left ventricle has  severely decreased function. The left ventricle demonstrates global  hypokinesis. Left ventricular  diastolic parameters are consistent with Grade II diastolic dysfunction  (pseudonormalization). Elevated left atrial pressure.    2. Right ventricular systolic function is moderately reduced. The right  ventricular size is moderately enlarged. There is moderately elevated  pulmonary artery systolic pressure. The estimated right ventricular  systolic pressure is 16.1 mmHg.  3. Left atrial size was severely dilated.  4. Right atrial size was severely dilated.  5. The mitral valve is degenerative. Moderate to severe mitral valve  regurgitation. No evidence of mitral stenosis.  6. Tricuspid valve regurgitation is severe.  7. Mild to moderate aortic stenosis. V max 2.2 m/s, mean gradient 10.5  mmHG. Gradients lower due to lower stroke volume (SV=38 cc; SVi=24 cc/m2).  The aortic valve is tricuspid. Aortic valve regurgitation is not  visualized.  8. There is mild dilatation of the ascending aorta measuring 41 mm.  9. The inferior vena cava is dilated in size with <50% respiratory  variability, suggesting right atrial pressure of 15 mmHg.   01/20/2020 ECG Sinus rhythm. QRSd 143ms.   EKG:  The ekg ordered today demonstrates sinus rhythm with v rate 95.  Recent Labs: 02/24/2020: Magnesium 1.7 05/31/2020: ALT 12; BUN 33; Creatinine, Ser 0.93; Hemoglobin 15.4; Platelets 260.0; Potassium 3.7; Sodium 133; TSH 1.69  Recent Lipid Panel    Component Value Date/Time   CHOL 140 05/31/2020 1240   TRIG 174.0 (H) 05/31/2020 1240   TRIG 182 (H) 10/10/2006 1254   HDL 38.00 (L) 05/31/2020 1240   CHOLHDL 4 05/31/2020 1240   VLDL 34.8 05/31/2020 1240   LDLCALC 67 05/31/2020 1240   LDLDIRECT 76.0 04/11/2017 1408    Physical Exam:    VS:  LMP 09/02/2003     Wt Readings from Last 3 Encounters:  05/26/20 126 lb 6.4 oz (57.3 kg)  05/16/20 126 lb 2 oz (57.2 kg)  05/03/20 127 lb 12.8 oz (58 kg)     GEN: Frail appearing in no acute distress HEENT: Normal NECK: No JVD; No carotid bruits LYMPHATICS: No lymphadenopathy CARDIAC: RRR, no murmurs, rubs, gallops RESPIRATORY:  Clear to auscultation without rales, wheezing or  rhonchi  ABDOMEN: Soft, non-tender, non-distended MUSCULOSKELETAL:  No edema; No deformity  SKIN: Warm and dry NEUROLOGIC:  Alert and oriented x 3 PSYCHIATRIC:  Normal affect   ASSESSMENT:    1. Chronic systolic congestive heart failure ([place])   2. Coronary  artery disease of bypass graft of native heart with stable angina pectoris ([place])    PLAN:    In order of problems listed above:  1. Chronic systolic heart failure NYHA class III symptoms on stable medical therapy.  Recommend primary prevention ICD.  No history of atrial arrhythmias and normal QRS duration.  Plan for [place] ICD.  Risks, benefits, alternatives were discussed with the patient in detail and she wishes to proceed. Continue spironolactone,vericiguat, Imdur, torsemide, metolazone, digoxin.  The patient has an ischemic CM, CHF, and CAD.  At this time, she meets criteria for ICD implantation for primary prevention of sudden death.  I have had a thorough discussion with the patient reviewing options.  The patient and their family (if available) have had opportunities to ask questions and have them answered. The patient and I have decided together through a shared decision making process to proceed with ICD at this time.   Risks, benefits, alternatives to ICD implantation were discussed in detail with the patient today. The patient understands that the risks include but are not limited to bleeding, infection, pneumothorax, perforation, tamponade, vascular damage, renal failure, MI, stroke, death, inappropriate shocks, and lead dislodgement and wishes to proceed.  We will therefore schedule device implantation at the next available time.   2. CAD s/p CABG Continue medical management.  Seems optimized during today's visit.  On aspirin and Plavix.  Medication Adjustments/Labs and Tests Ordered: Current medicines are reviewed at length with the patient today.  Concerns regarding medicines are outlined above.  No orders  of the defined types were placed in this encounter.  No orders of the defined types were placed in this encounter.   There are no Patient Instructions on file for this visit.   Signed, Lars Mage, MD, [facility]  06/06/2020 9:23 PM    Electrophysiology [facility]    Anticoagulation instructions: The patient is not on anticoagulation.  Medication instructions morning of: The patient should take their medications the morning of the procedure, except for any diuretic (lasix, torsemide, or bumex).   Discharge: Our plan will be to discharge the patient same day after a period of observation

## 2020-06-07 ENCOUNTER — Encounter: Payer: Self-pay | Admitting: Family Medicine

## 2020-06-07 ENCOUNTER — Encounter: Payer: Self-pay | Admitting: Cardiology

## 2020-06-07 ENCOUNTER — Other Ambulatory Visit: Payer: Self-pay

## 2020-06-07 ENCOUNTER — Ambulatory Visit (INDEPENDENT_AMBULATORY_CARE_PROVIDER_SITE_OTHER): Payer: Medicare Other | Admitting: Family Medicine

## 2020-06-07 ENCOUNTER — Ambulatory Visit (INDEPENDENT_AMBULATORY_CARE_PROVIDER_SITE_OTHER): Payer: Medicare Other | Admitting: Cardiology

## 2020-06-07 ENCOUNTER — Other Ambulatory Visit (HOSPITAL_COMMUNITY)
Admission: RE | Admit: 2020-06-07 | Discharge: 2020-06-07 | Disposition: A | Discharge status: Home / Self Care | Payer: Medicare Other | Source: Ambulatory Visit | Attending: Family Medicine | Admitting: Family Medicine

## 2020-06-07 VITALS — BP 112/60 | HR 95 | Ht <= 58 in | Wt 125.0 lb

## 2020-06-07 VITALS — BP 90/60 | HR 60 | Temp 96.1°F | Ht <= 58 in | Wt 125.9 lb

## 2020-06-07 DIAGNOSIS — I255 Ischemic cardiomyopathy: Secondary | ICD-10-CM

## 2020-06-07 DIAGNOSIS — E1169 Type 2 diabetes mellitus with other specified complication: Secondary | ICD-10-CM

## 2020-06-07 DIAGNOSIS — I5022 Chronic systolic (congestive) heart failure: Secondary | ICD-10-CM | POA: Diagnosis not present

## 2020-06-07 DIAGNOSIS — I10 Essential (primary) hypertension: Secondary | ICD-10-CM

## 2020-06-07 DIAGNOSIS — M79604 Pain in right leg: Secondary | ICD-10-CM

## 2020-06-07 DIAGNOSIS — F17200 Nicotine dependence, unspecified, uncomplicated: Secondary | ICD-10-CM

## 2020-06-07 DIAGNOSIS — Z1151 Encounter for screening for human papillomavirus (HPV): Secondary | ICD-10-CM | POA: Insufficient documentation

## 2020-06-07 DIAGNOSIS — Z0000 Encounter for general adult medical examination without abnormal findings: Secondary | ICD-10-CM

## 2020-06-07 DIAGNOSIS — D509 Iron deficiency anemia, unspecified: Secondary | ICD-10-CM | POA: Diagnosis not present

## 2020-06-07 DIAGNOSIS — G8929 Other chronic pain: Secondary | ICD-10-CM

## 2020-06-07 DIAGNOSIS — Z23 Encounter for immunization: Secondary | ICD-10-CM

## 2020-06-07 DIAGNOSIS — E538 Deficiency of other specified B group vitamins: Secondary | ICD-10-CM | POA: Diagnosis not present

## 2020-06-07 DIAGNOSIS — E1142 Type 2 diabetes mellitus with diabetic polyneuropathy: Secondary | ICD-10-CM

## 2020-06-07 DIAGNOSIS — M8589 Other specified disorders of bone density and structure, multiple sites: Secondary | ICD-10-CM

## 2020-06-07 DIAGNOSIS — E785 Hyperlipidemia, unspecified: Secondary | ICD-10-CM | POA: Diagnosis not present

## 2020-06-07 DIAGNOSIS — Z01419 Encounter for gynecological examination (general) (routine) without abnormal findings: Secondary | ICD-10-CM

## 2020-06-07 DIAGNOSIS — Z794 Long term (current) use of insulin: Secondary | ICD-10-CM

## 2020-06-07 DIAGNOSIS — I25708 Atherosclerosis of coronary artery bypass graft(s), unspecified, with other forms of angina pectoris: Secondary | ICD-10-CM | POA: Diagnosis not present

## 2020-06-07 MED ORDER — CYANOCOBALAMIN 1000 MCG/ML IJ SOLN
1000.0000 ug | Freq: Once | INTRAMUSCULAR | Status: AC
  Administered 2020-06-07: 1000 ug via INTRAMUSCULAR

## 2020-06-07 MED ORDER — HYDROCODONE-ACETAMINOPHEN 5-325 MG PO TABS: 1.0000 | ORAL_TABLET | Freq: Every day | ORAL | 0 refills | Status: DC | PRN

## 2020-06-07 NOTE — Assessment & Plan Note (Signed)
Disc in detail risks of smoking and possible outcomes including copd, vascular/ heart disease, cancer , respiratory and sinus infections  Pt voices understanding  Pt is not ready to quit  

## 2020-06-07 NOTE — Assessment & Plan Note (Signed)
Per pt recent improvement in glucose control Continues to see Dr Chalmers Cater Recent fasting glucose of 37 Sent for last DM eye exam report

## 2020-06-07 NOTE — Patient Instructions (Addendum)
Don't forget to schedule your mammogram   If you are interested in the new shingles vaccine (Shingrix) - call your local pharmacy to check on coverage and availability  If affordable, get on a wait list at your pharmacy to get the vaccine.  If you want to schedule a bone density test -call us back in November for a referral (at the breast center)   Get 2000 iu of vitamin D3 over the counter and take it daily  If calcium is ok for you -that is fine as well  This is for bone health   Start taking vitamin B12 1000 mcg take daily (you can do the under the tongue)  B12 shot today to boost you

## 2020-06-07 NOTE — Assessment & Plan Note (Signed)
LDL is controlled  crestor and zetia and vascepa Tolerating well  Disc goals for lipids and reasons to control them Rev last labs with pt Rev low sat fat diet in detail  In setting of CAD

## 2020-06-07 NOTE — Progress Notes (Signed)
Subjective:    Patient ID: SKYLEY GRANDMAISON, female    DOB: 1956/08/19, 64 y.o.   MRN: [id number]  This visit occurred during the SARS-CoV-2 public health emergency.  Safety protocols were in place, including screening questions prior to the visit, additional usage of staff PPE, and extensive cleaning of exam room while observing appropriate contact time as indicated for disinfecting solutions.    HPI Pt presents for amw and also annual f/u of chronic health problems   I have personally reviewed the Medicare Annual Wellness questionnaire and have noted 1. The patient's medical and social history 2. Their use of alcohol, tobacco or illicit drugs 3. Their current medications and supplements 4. The patient's functional ability including ADL's, fall risks, home safety risks and hearing or visual             impairment. 5. Diet and physical activities 6. Evidence for depression or mood disorders  The patients weight, height, BMI have been recorded in the chart and visual acuity is per eye clinic.  I have made referrals, counseling and provided education to the patient based review of the above and I have provided the pt with a written personalized care plan for preventive services. Reviewed and updated provider list, see scanned forms.  See scanned forms.  Routine anticipatory guidance given to patient.  See health maintenance. Colon cancer screening   Colonoscopy 10/18  Breast cancer screening   Mammogram 10/19 -plans to get one  Self breast exam-no new lumps or changes  Last pap - was pos for HPV a year ago  Flu vaccine-given today Tetanus vaccine 2/11  (postponed for financial)  Pneumovax-completed covid immunized-moderna  Zoster vaccine-interested in shingrix / will check on coverage  Hep C screen neg 2017 Dexa 10/19 -osteopenia  Falls- 6 weeks ago/ she tripped on a cord on floor/ removed cords  Fractures-none Supplements-no D or ca  Exercise - walks with walker/ limited by CHF  and does some PT exercises   Advance directive- has up to date  Cognitive function addressed- see scanned forms- and if abnormal then additional documentation follows.  No problems at all / very sharp and keeps up with all of her affairs including medical problems  Good concentration also  Likes to read also   PMH and SH reviewed  Meds, vitals, and allergies reviewed.   ROS: See HPI.  Otherwise negative.    Care team Roslynn Holte-pcp [place] -cardiology [facility]- pulmonary [place]- gyn [facility]-endocrinology [place] a defibrillator placement likely soon   Weight : Wt Readings from Last 3 Encounters:  06/07/20 125 lb 14.4 oz (57.1 kg)  05/26/20 126 lb 6.4 oz (57.3 kg)  05/16/20 126 lb 2 oz (57.2 kg)   27.24 kg/m   Hearing/vision:  Hearing Screening   Method: Audiometry   '125Hz'  '250Hz'  '500Hz'  '1000Hz'  '2000Hz'  '3000Hz'  '4000Hz'  '6000Hz'  '8000Hz'   Right ear:   Pass Pass Pass Pass Pass    Left ear:   Pass Pass Pass Pass Pass      Visual Acuity Screening   Right eye Left eye Both eyes  Without correction:     With correction: '20/15 20/40 20/20 '      HTN (in setting of CAD and CHF) bp is stable today  No cp or palpitations or headaches or edema  No side effects to medicines  BP Readings from Last 3 Encounters:  06/07/20 90/60  05/26/20 120/75  05/16/20 118/68      Smoking status  Copd/chronic  resp fail and OSA and smokers cough  No changes  Cannot seem to quit /wants to some day   DM2 Sees endocrinology (Dr Chalmers Cater) Eye exam -had in April -will send for  Foot care-good  Taking a statin  taking metformin and amaryl  Now added Traseba and doing better-has appt in oct   Seizure disorder hx  Taking keppra and sees neurology   Hyperlipidemia  Lab Results  Component Value Date   CHOL 140 05/31/2020   CHOL 148 03/07/2020   CHOL 165 02/24/2020   Lab Results  Component Value Date   HDL 38.00 (L) 05/31/2020   HDL 38 (L)  03/07/2020   HDL 38 (L) 02/24/2020   Lab Results  Component Value Date   LDLCALC 67 05/31/2020   LDLCALC 61 03/07/2020   [place] 91 02/24/2020   Lab Results  Component Value Date   TRIG 174.0 (H) 05/31/2020   TRIG 247 (H) 03/07/2020   TRIG 180 (H) 02/24/2020   Lab Results  Component Value Date   CHOLHDL 4 05/31/2020   CHOLHDL 3.9 03/07/2020   CHOLHDL 4.3 02/24/2020   Lab Results  Component Value Date   LDLDIRECT 76.0 04/11/2017   LDLDIRECT 98.0 06/09/2013   LDLDIRECT 53.6 12/12/2012   Taking crestor and zetia with goal of very low LDLVascepa    Anemia  Lab Results  Component Value Date   WBC 10.2 05/31/2020   HGB 15.4 (H) 05/31/2020   HCT 44.0 05/31/2020   MCV 91.0 05/31/2020   PLT 260.0 05/31/2020  improved  Taking niferex  Sees hematology -has visit in oct  Lab Results  Component Value Date   FERRITIN 27.4 05/31/2020     B12 def  Lab Results  Component Value Date   VITAMINB12 275 05/31/2020  will get a shot today  Will get back on B12      Lab Results  Component Value Date   CREATININE 0.93 05/31/2020   BUN 33 (H) 05/31/2020   NA 133 (L) 05/31/2020   K 3.7 05/31/2020   CL 89 (L) 05/31/2020   CO2 34 (H) 05/31/2020   Lab Results  Component Value Date   ALT 12 05/31/2020   AST 17 05/31/2020   ALKPHOS 69 05/31/2020   BILITOT 0.4 05/31/2020   Lab Results  Component Value Date   TSH 1.69 05/31/2020   good glucose at 84   Leg still hurts-flexeril did not help   Patient Active Problem List   Diagnosis Date Noted  . Chronic pain of right lower extremity 05/16/2020  . Right knee pain 05/16/2020  . Right groin pain 05/16/2020  . Atherosclerotic vascular disease 03/03/2020  . Healthcare maintenance 12/25/2019  . Shortness of breath 12/25/2019  . Dysphagia 12/09/2019  . Iron deficiency anemia 11/05/2019  . Coronary artery disease of bypass graft of native heart with stable angina pectoris ([place]) 11/02/2019  . Visit for routine gyn exam  04/23/2019  . Generalized weakness 03/24/2019  . Occult blood in stools   . Positive colorectal cancer screening using DNA-based stool test 12/22/2018  . Anemia 12/22/2018  . Fatigue 12/05/2018  . Estrogen deficiency 04/21/2018  . Screening mammogram, encounter for 04/21/2018  . Osteopenia 04/21/2018  . History of HPV infection 04/21/2018  . Dizzy spells 03/03/2018  . Ischemic cardiomyopathy 01/21/2018  . Other constipation 05/07/2017  . Colon cancer screening 04/19/2017  . Incisional hernia 01/14/2017  . Smokers' cough ([place]) 08/13/2016  . Chronic respiratory failure ([place]) 07/11/2016  . Sleep-related hypoventilation due  to lower airway obstruction 05/20/2016  . Periodic limb movements of sleep 05/20/2016  . Chronic systolic congestive heart failure ([place]) 05/08/2016  . Type 2 diabetes mellitus with diabetic polyneuropathy, with long-term current use of insulin ([place]) 12/13/2015  . B12 deficiency 12/01/2015  . History of CVA (cerebrovascular accident) 11/29/2015  . Abnormal nuclear stress test 05/06/2015  . 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 of localized onset, not intractable, without status epilepticus ([place]) 03/22/2015  . Cervical disc disorder with radiculopathy of cervical region 03/22/2015  . Complex partial seizures ([place]) 09/16/2013  . Encounter for Medicare annual wellness exam 06/16/2013  . Antiplatelet or antithrombotic long-term use 06/08/2013  . Seizures ([place]) 03/25/2013  . Goiter 09/01/2012  . Other screening mammogram 06/13/2012  . Allergic rhinitis 06/13/2012  . Carotid artery disease ([place]) 08/20/2011  . Obstructive sleep apnea 09/18/2007  . TOBACCO USE 08/08/2007  . Hyperlipidemia associated with type 2 diabetes mellitus ([place]) 03/26/2007  . ANXIETY 03/26/2007  . DEPRESSION 03/26/2007  . Migraine headache 03/26/2007  . CARPAL TUNNEL SYNDROME, BILATERAL 03/26/2007  . Essential hypertension 03/26/2007  . Hx of CABG  03/26/2007  . GERD 03/26/2007  . Claudication in peripheral vascular disease ([place]) 03/26/2007   Past Medical History:  Diagnosis Date  . Anemia   . Carotid stenosis    a. s/p right carotid stent 10/2017.  [name] Chronic systolic CHF (congestive heart failure) ([place])   . Colon polyps   . COPD (chronic obstructive pulmonary disease) ([place])   . Coronary artery disease    post CABG in 3/07 , coronary stents   . Diabetes mellitus    type 2  . Dyslipidemia   . Fibromyalgia   . GERD (gastroesophageal reflux disease)   . Headache    hx of   . Hyperlipidemia   . Hypertension   . Myocardial infarction ([place])   . Pneumonia    hx of   . Seizures ([place])    last seizure- 03/2013   . Shortness of breath dyspnea    with exertion or when fluid builds up   . Sleep apnea    used to wear a cpap- not used in 3 years   . Status post dilation of esophageal narrowing   . Stroke [facility]) 1993   problems with balance   . Systolic murmur    known mild AS and MR  . Thyroid goiter   . Tobacco abuse    Past Surgical History:  Procedure Laterality Date  . ABDOMINAL AORTOGRAM W/LOWER EXTREMITY N/A 04/18/2020   Procedure: ABDOMINAL AORTOGRAM W/LOWER EXTREMITY;  Surgeon: Lorretta Harp, MD;  Location: [place] [facility];  Service: Cardiovascular;  Laterality: N/A;  . APPENDECTOMY    . BIOPSY  02/03/2019   Procedure: BIOPSY;  Surgeon: Ladene Artist, MD;  Location: [facility];  Service: Endoscopy;;  . BREAST BIOPSY Left 2016  . CARDIAC CATHETERIZATION  11/29/05   EF of 55%  . CARDIAC CATHETERIZATION  08/06/06   EF of 45-50%  . CARDIAC CATHETERIZATION N/A 05/06/2015   Procedure: Right/Left Heart Cath and Coronary/Graft Angiography;  Surgeon: Peter M Martinique, MD;  Location: [place] [facility];  Service: Cardiovascular;  Laterality: N/A;  . CAROTID PTA/STENT INTERVENTION N/A 10/10/2017   Procedure: CAROTID PTA/STENT INTERVENTION - Right;  Surgeon: Serafina Mitchell, MD;  Location: [place] [facility];  Service:  Cardiovascular;  Laterality: N/A;  . CERVICAL FUSION  1990  . CHOLECYSTECTOMY    . COLON RESECTION  mass removed and 4 in of colon  . COLONOSCOPY WITH PROPOFOL N/A 07/16/2017   Procedure: COLONOSCOPY WITH PROPOFOL;  Surgeon: Ladene Artist, MD;  [facility]: [facility];  Service: Endoscopy;  Laterality: N/A;  . CORONARY ARTERY BYPASS GRAFT  12/04/2005   x5 -- left internal mammary artery to the LAD, left radial artery to the ramus intermedius, saphenous vein graft to the obtuse marginal 1, sequential saphenous vein grat to the acute marginal and posterior descending, endoscopic vein harvesting from the left thigh with open vein harvest from right leg  . CORONARY STENT PLACEMENT  08/11/06   PCI of her ciurcumflex/OM vessel  . ESOPHAGOGASTRODUODENOSCOPY (EGD) WITH PROPOFOL N/A 02/03/2019   Procedure: ESOPHAGOGASTRODUODENOSCOPY (EGD) WITH PROPOFOL;  Surgeon: Ladene Artist, MD;  [facility];  Service: Endoscopy;  Laterality: N/A;  . LAPAROTOMY Bilateral 05/19/2015   Procedure: EXPLORATORY LAPAROTOMY WITH BILATERAL SALPINGO OOPHORECTOMY /OMENTECTOMY/SEGMENTAL SIGMOID COLECTOMY ;  Surgeon: Everitt Amber, MD;  Location: [facility];  Service: Gynecology;  Laterality: Bilateral;  . PERIPHERAL VASCULAR BALLOON ANGIOPLASTY  04/18/2020   Procedure: PERIPHERAL VASCULAR BALLOON ANGIOPLASTY;  Surgeon: Lorretta Harp, MD;  Location: [place] [facility];  Service: Cardiovascular;;  rt SFA  atherectomy and DCB  . PERIPHERAL VASCULAR CATHETERIZATION N/A 11/15/2015   Procedure: Carotid PTA/Stent Intervention;  Surgeon: Lorretta Harp, MD;  Location: [place] [facility];  Service: Cardiovascular;  Laterality: N/A;  . PERIPHERAL VASCULAR CATHETERIZATION  11/15/2015   Procedure: Carotid Angiography;  Surgeon: Lorretta Harp, MD;  Location: [place] [facility];  Service: Cardiovascular;;  . RIGHT HEART CATH N/A 01/27/2020   Procedure: RIGHT HEART CATH;  Surgeon: Larey Dresser, MD;  Location: [place] [facility];  Service: Cardiovascular;  Laterality: N/A;   Social History   Tobacco Use  . Smoking status: Current Every Day Smoker    Packs/day: 1.00    Years: 51.00    Pack years: 51.00    Types: Cigarettes    Start date: 21    Last attempt to quit: 02/22/2020    Years since quitting: 0.2  . Smokeless tobacco: Never Used  Vaping Use  . Vaping Use: Former  Substance Use Topics  . Alcohol use: No  . Drug use: No   Family History  Problem Relation Age of Onset  . Heart disease Mother   . Diabetes Mother   . COPD Mother   . Hyperlipidemia Mother   . Hypertension Mother   . Cancer Father        met, origin unknown  . Heart attack Father   . Drug abuse Paternal Grandmother   . Stroke Paternal Grandfather   . Lung cancer Paternal Aunt        lung with mets to brain  . Melanoma Paternal Uncle   . Lung cancer Paternal Uncle        lung/liver to brain  . Cancer Paternal Uncle        cancer of unknown type  . Heart disease Maternal Grandfather   . Hypertension Sister   . Cancer Sister        eyelid  . Glaucoma Sister   . Heart disease Maternal Aunt        x 2 aunts   Allergies  Allergen Reactions  . Bee Venom Itching and Swelling  . Benadryl [Diphenhydramine] Other (See Comments)    Insomnia   . Entresto [Sacubitril-Valsartan] Cough  . Farxiga [Dapagliflozin]     Yeast infection  . Tetracycline  Unknown, pt cannot recall exact reaction  . Ace Inhibitors Cough   Current Outpatient Medications on File Prior to Visit  Medication Sig Dispense Refill  . acetaminophen (TYLENOL) 500 MG tablet Take 1,000 mg by mouth every 6 (six) hours as needed for moderate pain or headache.    . albuterol (VENTOLIN HFA) 108 (90 Base) MCG/ACT inhaler INHALE 2 PUFFS EVERY 6 HOURS AS NEEDED FOR WHEEZING/SHORTNESS OF BREATH 18 g 1  . aspirin EC 81 MG tablet Take 81 mg by mouth every evening.  30 tablet 6  . clopidogrel (PLAVIX) 75 MG tablet Take 1 tablet (75 mg total) by mouth daily. 90  tablet 3  . cyclobenzaprine (FLEXERIL) 10 MG tablet Take 0.5-1 tablets (5-10 mg total) by mouth at bedtime. For pain 30 tablet 1  . dexlansoprazole (DEXILANT) 60 MG capsule Take 60 mg by mouth daily.    . digoxin (LANOXIN) 0.125 MG tablet TAKE 0.5 TABLETS (0.0625 MG TOTAL) BY MOUTH DAILY. 45 tablet 3  . diphenhydrAMINE (BENADRYL) 25 mg capsule Take 25 mg by mouth 2 (two) times daily as needed for itching or allergies.     [name] ezetimibe (ZETIA) 10 MG tablet Take 1 tablet (10 mg total) by mouth daily. 30 tablet 6  . fluticasone (FLONASE) 50 MCG/ACT nasal spray Place 2 sprays into both nostrils daily. 48 g 1  . gabapentin (NEURONTIN) 300 MG capsule TAKE 2 CAPSULES BY MOUTH EVERY DAY AT BEDTIME 180 capsule 1  . glimepiride (AMARYL) 4 MG tablet Take 4 mg by mouth in the morning and at bedtime.    . Glycopyrrolate-Formoterol (BEVESPI AEROSPHERE) 9-4.8 MCG/ACT AERO Inhale 2 puffs into the lungs 2 (two) times daily. 32.1 g 1  . guaiFENesin (MUCINEX) 600 MG 12 hr tablet Take 2 tablets (1,200 mg total) by mouth 2 (two) times daily. 360 tablet 3  . icosapent Ethyl (VASCEPA) 1 g capsule Take 2 capsules (2 g total) by mouth 2 (two) times daily. 120 capsule 3  . iron polysaccharides (NU-IRON) 150 MG capsule Take 1 capsule (150 mg total) by mouth 2 (two) times daily. 180 capsule 3  . isosorbide mononitrate (IMDUR) 30 MG 24 hr tablet Take 30 mg by mouth daily.     [name] levETIRAcetam (KEPPRA) 1000 MG tablet Take 1 tablet (1,000 mg total) by mouth 2 (two) times daily. 180 tablet 3  . metFORMIN (GLUCOPHAGE-XR) 500 MG 24 hr tablet Take 500 mg by mouth every evening.   6  . metolazone (ZAROXOLYN) 2.5 MG tablet Take 1 tablet (2.5 mg total) by mouth 2 (two) times a week. Every Mon and Fri 15 tablet 4  . nicotine (NICODERM CQ - DOSED IN MG/24 HOURS) 21 mg/24hr patch Place 21 mg onto the skin daily.    . nicotine polacrilex (NICORETTE) 4 MG gum Take 1 each (4 mg total) by mouth as needed for smoking cessation. 90 tablet 2  .  nitroGLYCERIN (NITROLINGUAL) 0.4 MG/SPRAY spray Place 1 spray under the tongue every 5 (five) minutes x 3 doses as needed for chest pain. 12 g 3  . NYSTATIN powder APPLY 1 APPLICATION        TOPICALLY 4    TIMES A     DAY AS NEEDED    FOR YEAST    INFECTIONS 45 g 1  . ONE TOUCH ULTRA TEST test strip USE AS DIRECTED FOR TESTING BLOOD GLUCOSE 3 TIMES DAILY  1  . polyethylene glycol (MIRALAX / GLYCOLAX) 17 g packet Take 17 g by mouth daily.    [name]  potassium chloride SA (KLOR-CON) 20 MEQ tablet Take 1 tablet (20 mEq total) by mouth 2 (two) times daily.    . promethazine (PHENERGAN) 25 MG tablet Take 1 tablet (25 mg total) by mouth every 8 (eight) hours as needed for nausea or vomiting. Caution of sedation 30 tablet 1  . ranolazine (RANEXA) 1000 MG SR tablet Take 1 tablet (1,000 mg total) by mouth 2 (two) times daily. 180 tablet 3  . rosuvastatin (CRESTOR) 20 MG tablet Take 1 tablet (20 mg total) by mouth daily. 30 tablet 6  . sodium chloride (OCEAN) 0.65 % SOLN nasal spray Place 1 spray into both nostrils as needed for congestion.    [name] spironolactone (ALDACTONE) 25 MG tablet Take 1 tablet (25 mg total) by mouth at bedtime. 90 tablet 3  . torsemide (DEMADEX) 20 MG tablet TAKE 3 TABLETS (60 MG TOTAL) BY MOUTH EVERY MORNING AND 2 TABLETS (40 MG TOTAL) EVERY EVENING. 450 tablet 3  . TRESIBA FLEXTOUCH 100 UNIT/ML FlexTouch Pen Inject 35 Units into the skin daily.     . Vericiguat (VERQUVO) 2.5 MG TABS Take 2.5 mg by mouth daily. 30 tablet 6   No current facility-administered medications on file prior to visit.    Review of Systems  Constitutional: Positive for fatigue. Negative for activity change, appetite change, fever and unexpected weight change.  HENT: Negative for congestion, ear pain, rhinorrhea, sinus pressure and sore throat.   Eyes: Negative for pain, redness and visual disturbance.  Respiratory: Negative for cough, shortness of breath and wheezing.        Baseline sob on exertion     Cardiovascular: Negative for chest pain and palpitations.  Gastrointestinal: Negative for abdominal pain, blood in stool, constipation and diarrhea.  Endocrine: Negative for polydipsia and polyuria.  Genitourinary: Negative for dysuria, frequency and urgency.  Musculoskeletal: Positive for back pain. Negative for arthralgias and myalgias.       Chronic R leg pain worse at night  Skin: Negative for pallor and rash.  Allergic/Immunologic: Negative for environmental allergies.  Neurological: Negative for dizziness, syncope and headaches.  Hematological: Negative for adenopathy. Does not bruise/bleed easily.  Psychiatric/Behavioral: Negative for decreased concentration and dysphoric mood. The patient is not nervous/anxious.        Objective:   Physical Exam Constitutional:      General: She is not in acute distress.    Appearance: Normal appearance. She is well-developed and normal weight. She is not ill-appearing or diaphoretic.  HENT:     Head: Normocephalic and atraumatic.     Right Ear: Tympanic membrane, ear canal and external ear normal.     Left Ear: Tympanic membrane, ear canal and external ear normal.     Nose: Nose normal. No congestion.     Mouth/Throat:     Mouth: Mucous membranes are moist.     Pharynx: Oropharynx is clear. No posterior oropharyngeal erythema.  Eyes:     General: No scleral icterus.    Extraocular Movements: Extraocular movements intact.     Conjunctiva/sclera: Conjunctivae normal.     Pupils: Pupils are equal, round, and reactive to light.  Neck:     Thyroid: No thyromegaly.     Vascular: No carotid bruit or JVD.  Cardiovascular:     Rate and Rhythm: Normal rate and regular rhythm.     Pulses: Normal pulses.     Heart sounds: Murmur heard.  No gallop.   Pulmonary:     Effort: Pulmonary effort is normal. No respiratory  distress.     Breath sounds: Normal breath sounds. No wheezing.     Comments: Good air exch Chest:     Chest wall: No  tenderness.  Abdominal:     General: Bowel sounds are normal. There is no distension or abdominal bruit.     Palpations: Abdomen is soft. There is no mass.     Tenderness: There is no abdominal tenderness.     Hernia: No hernia is present.  Genitourinary:    Comments: Breast exam: No mass, nodules, thickening, tenderness, bulging, retraction, inflamation, nipple discharge or skin changes noted.  No axillary or clavicular LA.     Musculoskeletal:        General: No tenderness. Normal range of motion.     Cervical back: Normal range of motion and neck supple. No rigidity. No muscular tenderness.     Right lower leg: No edema.     Left lower leg: No edema.  Lymphadenopathy:     Cervical: No cervical adenopathy.  Skin:    General: Skin is warm and dry.     Coloration: Skin is not pale.     Findings: No erythema or rash.     Comments: Tanned Dry skin   Neurological:     Mental Status: She is alert. Mental status is at baseline.     Cranial Nerves: No cranial nerve deficit.     Motor: No abnormal muscle tone.     Coordination: Coordination normal.     Gait: Gait normal.     Deep Tendon Reflexes: Reflexes are normal and symmetric. Reflexes normal.  Psychiatric:        Mood and Affect: Mood normal.        Cognition and Memory: Cognition and memory normal.     Comments: Mentally sharp           Assessment & Plan:   Problem List Items Addressed This Visit      Cardiovascular and Mediastinum   Essential hypertension - Primary    bp in fair control at this time  BP Readings from Last 1 Encounters:  06/07/20 90/60   No changes needed [facility] cardiology f/u Most recent labs reviewed  Disc lifstyle change with low sodium diet and exercise          Endocrine   Hyperlipidemia associated with type 2 diabetes mellitus ([place])    LDL is controlled  crestor and zetia and vascepa Tolerating well  Disc goals for lipids and reasons to control them Rev last labs with pt Rev low  sat fat diet in detail  In setting of CAD       Type 2 diabetes mellitus with diabetic polyneuropathy, with long-term current use of insulin ([place])    Per pt recent improvement in glucose control Continues to see Dr Chalmers Cater Recent fasting glucose of 73 Sent for last DM eye exam report          Musculoskeletal and Integument   Osteopenia    In long time smoker No falls or fx Due for 2 y dexa in oct/nov-she will call then for referral  Counseled on ca and D        Other   TOBACCO USE    Disc in detail risks of smoking and possible outcomes including copd, vascular/ heart disease, cancer , respiratory and sinus infections  Pt voices understanding Pt is not ready to quit      B12 deficiency    Lab Results  Component Value Date  [id number] 275 05/31/2020   Drifting down  Given injection today  inst to get back on oral B12 1000 mcg daily      Visit for routine gyn exam   Relevant Orders   Cytology - PAP()   Iron deficiency anemia    Improved cbc and ferritin Has hematology f/u upcoming  utd colonoscopy      Chronic pain of right lower extremity    No improvement with flexeril Likely radicular Does not want to see specialist at this time #15 norco given  Use sparingly  If needed regularly will have to use stop act rules      Relevant Medications   HYDROcodone-acetaminophen (NORCO) 5-325 MG tablet    Other Visit Diagnoses    Need for immunization against influenza       Relevant Orders   Flu Vaccine QUAD High Dose(Fluad) (Completed)

## 2020-06-07 NOTE — Assessment & Plan Note (Signed)
Improved cbc and ferritin Has hematology f/u upcoming  utd colonoscopy

## 2020-06-07 NOTE — Assessment & Plan Note (Signed)
No improvement with flexeril Likely radicular Does not want to see specialist at this time #15 norco given  Use sparingly  If needed regularly will have to use stop act rules

## 2020-06-07 NOTE — Addendum Note (Signed)
Addended by: Fulton Mole D on: 06/07/2020 02:12 PM   Modules accepted: Orders

## 2020-06-07 NOTE — Assessment & Plan Note (Signed)
bp in fair control at this time  BP Readings from Last 1 Encounters:  06/07/20 90/60   No changes needed Continues cardiology f/u Most recent labs reviewed  Disc lifstyle change with low sodium diet and exercise

## 2020-06-07 NOTE — Assessment & Plan Note (Signed)
In long time smoker No falls or fx Due for 2 y dexa in oct/nov-she will call then for referral  Counseled on ca and D

## 2020-06-07 NOTE — Assessment & Plan Note (Signed)
Lab Results  Component Value Date   VITAMINB12 275 05/31/2020   Drifting down  Given injection today  inst to get back on oral B12 1000 mcg daily

## 2020-06-07 NOTE — Assessment & Plan Note (Signed)
Reviewed health habits including diet and exercise and skin cancer prevention Reviewed appropriate screening tests for age  Also reviewed health mt list, fam hx and immunization status , as well as social and family history   See HPI Labs reviewed  Pt plans to schedule mammogram  Will call for dexa ref in nov (no fractures), discussed fall prev Disc shingrix vaccines-interested if covered Gyn exam done Adv directive is utd No cognitive concerns Nl hearing/vision screen (sent for last dm eye exam report)

## 2020-06-07 NOTE — Patient Instructions (Addendum)
Medication Instructions:  Your physician recommends that you continue on your current medications as directed. Please refer to the Current Medication list given to you today.  Labwork: None ordered.  Testing/Procedures: None ordered.  Follow-Up:  SEE INSTRUCTION LETTER  Any Other Special Instructions Will Be Listed Below (If Applicable).  If you need a refill on your cardiac medications before your next appointment, please call your pharmacy.    Cardioverter Defibrillator Implantation  An implantable cardioverter defibrillator (ICD) is a small device that is placed under the skin in the chest or abdomen. An ICD consists of a battery, a small computer (pulse generator), and wires (leads) that go into the heart. An ICD is used to detect and correct two types of dangerous irregular heartbeats (arrhythmias):  A rapid heart rhythm (tachycardia).  An arrhythmia in which the lower chambers of the heart (ventricles) contract in an uncoordinated way (fibrillation). When an ICD detects tachycardia, it sends a low-energy shock to the heart to restore the heartbeat to normal (cardioversion). This signal is usually painless. If cardioversion does not work or if the ICD detects fibrillation, it delivers a high-energy shock to the heart (defibrillation) to restart the heart. This shock may feel like a strong jolt in the chest. Your health care provider may prescribe an ICD if:  You have had an arrhythmia that originated in the ventricles.  Your heart has been damaged by a disease or heart condition. Sometimes, ICDs are programmed to act as a device called a pacemaker. Pacemakers can be used to treat a slow heartbeat (bradycardia) or tachycardia by taking over the heart rate with electrical impulses. Tell a health care provider about:  Any allergies you have.  All medicines you are taking, including vitamins, herbs, eye drops, creams, and over-the-counter medicines.  Any problems you or family  members have had with anesthetic medicines.  Any blood disorders you have.  Any surgeries you have had.  Any medical conditions you have.  Whether you are pregnant or may be pregnant. What are the risks? Generally, this is a safe procedure. However, problems may occur, including:  Swelling, bleeding, or bruising.  Infection.  Blood clots.  Damage to other structures or organs, such as nerves, blood vessels, or the heart.  Allergic reactions to medicines used during the procedure. What happens before the procedure? Staying hydrated Follow instructions from your health care provider about hydration, which may include:  Up to 2 hours before the procedure - you may continue to drink clear liquids, such as water, clear fruit juice, black coffee, and plain tea. Eating and drinking restrictions Follow instructions from your health care provider about eating and drinking, which may include:  8 hours before the procedure - stop eating heavy meals or foods such as meat, fried foods, or fatty foods.  6 hours before the procedure - stop eating light meals or foods, such as toast or cereal.  6 hours before the procedure - stop drinking milk or drinks that contain milk.  2 hours before the procedure - stop drinking clear liquids. Medicine Ask your health care provider about:  Changing or stopping your normal medicines. This is important if you take diabetes medicines or blood thinners.  Taking medicines such as aspirin and ibuprofen. These medicines can thin your blood. Do not take these medicines before your procedure if your doctor tells you not to. Tests  You may have blood tests.  You may have a test to check the electrical signals in your heart (electrocardiogram, ECG).  You may have imaging tests, such as a chest X-ray. General instructions  For 24 hours before the procedure, stop using products that contain nicotine or tobacco, such as cigarettes and e-cigarettes. If you  need help quitting, ask your health care provider.  Plan to have someone take you home from the hospital or clinic.  You may be asked to shower with a germ-killing soap. What happens during the procedure?  To reduce your risk of infection: ? Your health care team will wash or sanitize their hands. ? Your skin will be washed with soap. ? Hair may be removed from the surgical area.  Small monitors will be put on your body. They will be used to check your heart, blood pressure, and oxygen level.  An IV tube will be inserted into one of your veins.  You will be given one or more of the following: ? A medicine to help you relax (sedative). ? A medicine to numb the area (local anesthetic). ? A medicine to make you fall asleep (general anesthetic).  Leads will be guided through a blood vessel into your heart and attached to your heart muscles. Depending on the ICD, the leads may go into one ventricle or they may go into both ventricles and into an upper chamber of the heart. An X-ray machine (fluoroscope) will be usedto help guide the leads.  A small incision will be made to create a deep pocket under your skin.  The pulse generator will be placed into the pocket.  The ICD will be tested.  The incision will be closed with stitches (sutures), skin glue, or staples.  A bandage (dressing) will be placed over the incision. This procedure may vary among health care providers and hospitals. What happens after the procedure?  Your blood pressure, heart rate, breathing rate, and blood oxygen level will be monitored often until the medicines you were given have worn off.  A chest X-ray will be taken to check that the ICD is in the right place.  You will need to stay in the hospital for 1-2 days so your health care provider can make sure your ICD is working.  Do not drive for 24 hours if you received a sedative. Ask your health care provider when it is safe for you to drive.  You may be  given an identification card explaining that you have an ICD. Summary  An implantable cardioverter defibrillator (ICD) is a small device that is placed under the skin in the chest or abdomen. It is used to detect and correct dangerous irregular heartbeats (arrhythmias).  An ICD consists of a battery, a small computer (pulse generator), and wires (leads) that go into the heart.  When an ICD detects rapid heart rhythm (tachycardia), it sends a low-energy shock to the heart to restore the heartbeat to normal (cardioversion). If cardioversion does not work or if the ICD detects uncoordinated heart contractions (fibrillation), it delivers a high-energy shock to the heart (defibrillation) to restart the heart.  You will need to stay in the hospital for 1-2 days to make sure your ICD is working. This information is not intended to replace advice given to you by your health care provider. Make sure you discuss any questions you have with your health care provider. Document Revised: 08/30/2017 Document Reviewed: 09/26/2016 Elsevier Patient Education  2020 [employer].

## 2020-06-08 ENCOUNTER — Telehealth: Payer: Self-pay | Admitting: Cardiology

## 2020-06-08 DIAGNOSIS — I5022 Chronic systolic (congestive) heart failure: Secondary | ICD-10-CM

## 2020-06-08 NOTE — Telephone Encounter (Signed)
New message   Pt states that she needs to RS ICD implant please call

## 2020-06-08 NOTE — Telephone Encounter (Signed)
Returned call to Pt.  Pt rescheduled ICD procedure for 07/15/20.  Will reschedule covid test and remail instruction letter per patient request.

## 2020-06-13 LAB — CYTOLOGY - PAP
Comment: NEGATIVE
Comment: NEGATIVE
Diagnosis: UNDETERMINED — AB
HPV 16: NEGATIVE
HPV 18 / 45: NEGATIVE
High risk HPV: POSITIVE — AB

## 2020-06-14 ENCOUNTER — Telehealth: Payer: Self-pay | Admitting: Family Medicine

## 2020-06-14 DIAGNOSIS — R87810 Cervical high risk human papillomavirus (HPV) DNA test positive: Secondary | ICD-10-CM

## 2020-06-14 DIAGNOSIS — R87610 Atypical squamous cells of undetermined significance on cytologic smear of cervix (ASC-US): Secondary | ICD-10-CM

## 2020-06-14 NOTE — Telephone Encounter (Signed)
-----   Message from Tammi Sou, [place] sent at 06/14/2020  4:13 PM EDT ----- Pt notified of pap results and Dr. Marliss Coots recommendations. Pt agrees with gyn referral she would like to see someone in [place] if possible. I advise pt our [facility] will call to schedule appt. Please put referral in

## 2020-06-17 ENCOUNTER — Other Ambulatory Visit (HOSPITAL_COMMUNITY): Payer: Medicare Other

## 2020-06-23 ENCOUNTER — Other Ambulatory Visit: Payer: Self-pay | Admitting: Family Medicine

## 2020-06-27 ENCOUNTER — Telehealth: Payer: Self-pay | Admitting: Cardiology

## 2020-06-27 NOTE — Telephone Encounter (Signed)
    Pt would like to know information about her upcoming procedure

## 2020-06-27 NOTE — Telephone Encounter (Signed)
Pt was just calling to verify that her procedure date to get her ICD implanted, is for 07/15/20 at 0830.  Informed the pt that she is correct, her procedure is scheduled for 07/15/20 at 0830.  Informed her that her covid test is scheduled for 07/13/20 at 1145, and she must quarantine after her test, until her procedure on 10/15.  Pt states she received her letter from [employer] with instructions on how to prepare for this procedure, she just wanted to clarify the procedure date and time. Pt verbalized understanding and agrees with this plan.  Pt was more than gracious for all the assistance provided.

## 2020-06-28 ENCOUNTER — Other Ambulatory Visit (HOSPITAL_COMMUNITY): Payer: Self-pay | Admitting: Cardiology

## 2020-06-28 DIAGNOSIS — E782 Mixed hyperlipidemia: Secondary | ICD-10-CM

## 2020-06-28 NOTE — Progress Notes (Deleted)
[facility] OFFICE PROGRESS NOTE  Tower, [name], [place] [id number]  DIAGNOSIS: Iron Deficiency Anemia  PRIOR THERAPY: None  CURRENT THERAPY:  1) Venofer infusions as needed. Last dose on 01/29/20  2) Oral iron supplement daily.  INTERVAL HISTORY: Diane [name] 64 y.o. female returns to the clinic today for a follow-up visit.  The patient receives Venofer as needed for iron deficiency anemia and her last treatment was given on 01/29/2020.  She also continues to take her oral iron supplement.  Patient did not notice any appreciable change in her fatigue after getting her iron infusions.  She also reports her dyspnea on exertion which is likely multifactorial secondary to anemia, CHF, COPD, etc.  She is established with the heart failure clinic. She is going to get an ICD placed soon.  Otherwise today, the patient denies any chest pain, headaches, or dizziness.  Denies any abnormal bleeding except for easy bruising due to Plavix and aspirin use. She has some bruising on her upper extremities. Her last endoscopy was in spring 2020 and her last colonoscopy was in 2018 both of which were unremarkable.  She had FOBT testing which was negative. SShe denies any epistaxis, gingival bleeding, hypertension, hematuria, hemoptysis, or hematemesis.  She is here today for evaluation and a repeat CBC, ferritin, and iron studies.  MEDICAL HISTORY: Past Medical History:  Diagnosis Date  . Anemia   . Carotid stenosis    a. s/p right carotid stent 10/2017.  [name] Chronic systolic CHF (congestive heart failure) ([place])   . Colon polyps   . COPD (chronic obstructive pulmonary disease) ([place])   . Coronary artery disease    post CABG in 3/07 , coronary stents   . Diabetes mellitus    type 2  . Dyslipidemia   . Fibromyalgia   . GERD (gastroesophageal reflux disease)   . Headache    hx of   . Hyperlipidemia   . Hypertension   . Myocardial infarction ([place])   .  Pneumonia    hx of   . Seizures ([place])    last seizure- 03/2013   . Shortness of breath dyspnea    with exertion or when fluid builds up   . Sleep apnea    used to wear a cpap- not used in 3 years   . Status post dilation of esophageal narrowing   . Stroke [facility]) 1993   problems with balance   . Systolic murmur    known mild AS and MR  . Thyroid goiter   . Tobacco abuse     ALLERGIES:  is allergic to bee venom, benadryl [diphenhydramine], entresto [sacubitril-valsartan], farxiga [dapagliflozin], tetracycline, and ace inhibitors.  MEDICATIONS:  Current Outpatient Medications  Medication Sig Dispense Refill  . DEXILANT 60 MG capsule TAKE 1 CAPSULE DAILY 90 capsule 1  . acetaminophen (TYLENOL) 500 MG tablet Take 1,000 mg by mouth every 6 (six) hours as needed for moderate pain or headache.    . albuterol (VENTOLIN HFA) 108 (90 Base) MCG/ACT inhaler INHALE 2 PUFFS EVERY 6 HOURS AS NEEDED FOR WHEEZING/SHORTNESS OF BREATH 18 g 1  . aspirin EC 81 MG tablet Take 81 mg by mouth every evening.  30 tablet 6  . clopidogrel (PLAVIX) 75 MG tablet Take 1 tablet (75 mg total) by mouth daily. 90 tablet 3  . cyclobenzaprine (FLEXERIL) 10 MG tablet Take 0.5-1 tablets (5-10 mg total) by mouth at bedtime. For pain 30 tablet 1  .  digoxin (LANOXIN) 0.125 MG tablet TAKE 0.5 TABLETS (0.0625 MG TOTAL) BY MOUTH DAILY. 45 tablet 3  . diphenhydrAMINE (BENADRYL) 25 mg capsule Take 25 mg by mouth 2 (two) times daily as needed for itching or allergies.     [name] ezetimibe (ZETIA) 10 MG tablet Take 1 tablet (10 mg total) by mouth daily. 30 tablet 6  . fluticasone (FLONASE) 50 MCG/ACT nasal spray Place 2 sprays into both nostrils daily. 48 g 1  . gabapentin (NEURONTIN) 300 MG capsule TAKE 2 CAPSULES BY MOUTH EVERY DAY AT BEDTIME 180 capsule 1  . glimepiride (AMARYL) 4 MG tablet Take 4 mg by mouth in the morning and at bedtime.    . Glycopyrrolate-Formoterol (BEVESPI AEROSPHERE) 9-4.8 MCG/ACT AERO Inhale 2 puffs into the  lungs 2 (two) times daily. 32.1 g 1  . guaiFENesin (MUCINEX) 600 MG 12 hr tablet Take 2 tablets (1,200 mg total) by mouth 2 (two) times daily. 360 tablet 3  . HYDROcodone-acetaminophen (NORCO) 5-325 MG tablet Take 1 tablet by mouth daily as needed for severe pain. 15 tablet 0  . iron polysaccharides (NU-IRON) 150 MG capsule Take 1 capsule (150 mg total) by mouth 2 (two) times daily. 180 capsule 3  . isosorbide mononitrate (IMDUR) 30 MG 24 hr tablet Take 30 mg by mouth daily.     [name] levETIRAcetam (KEPPRA) 1000 MG tablet Take 1 tablet (1,000 mg total) by mouth 2 (two) times daily. 180 tablet 3  . metFORMIN (GLUCOPHAGE-XR) 500 MG 24 hr tablet Take 500 mg by mouth every evening.   6  . metolazone (ZAROXOLYN) 2.5 MG tablet Take 1 tablet (2.5 mg total) by mouth 2 (two) times a week. Every Mon and Fri 15 tablet 4  . nicotine (NICODERM CQ - DOSED IN MG/24 HOURS) 21 mg/24hr patch Place 21 mg onto the skin daily.    . nicotine polacrilex (NICORETTE) 4 MG gum Take 1 each (4 mg total) by mouth as needed for smoking cessation. 90 tablet 2  . nitroGLYCERIN (NITROLINGUAL) 0.4 MG/SPRAY spray Place 1 spray under the tongue every 5 (five) minutes x 3 doses as needed for chest pain. 12 g 3  . NYSTATIN powder APPLY 1 APPLICATION        TOPICALLY 4    TIMES A     DAY AS NEEDED    FOR YEAST    INFECTIONS 45 g 1  . ONE TOUCH ULTRA TEST test strip USE AS DIRECTED FOR TESTING BLOOD GLUCOSE 3 TIMES DAILY  1  . polyethylene glycol (MIRALAX / GLYCOLAX) 17 g packet Take 17 g by mouth daily.    . potassium chloride SA (KLOR-CON) 20 MEQ tablet Take 1 tablet (20 mEq total) by mouth 2 (two) times daily.    . promethazine (PHENERGAN) 25 MG tablet Take 1 tablet (25 mg total) by mouth every 8 (eight) hours as needed for nausea or vomiting. Caution of sedation 30 tablet 1  . ranolazine (RANEXA) 1000 MG SR tablet Take 1 tablet (1,000 mg total) by mouth 2 (two) times daily. 180 tablet 3  . rosuvastatin (CRESTOR) 20 MG tablet Take 1 tablet  (20 mg total) by mouth daily. 30 tablet 6  . sodium chloride (OCEAN) 0.65 % SOLN nasal spray Place 1 spray into both nostrils as needed for congestion.    [name] spironolactone (ALDACTONE) 25 MG tablet Take 1 tablet (25 mg total) by mouth at bedtime. 90 tablet 3  . torsemide (DEMADEX) 20 MG tablet TAKE 3 TABLETS (60 MG TOTAL) BY  MOUTH EVERY MORNING AND 2 TABLETS (40 MG TOTAL) EVERY EVENING. 450 tablet 3  . TRESIBA FLEXTOUCH 100 UNIT/ML FlexTouch Pen Inject 35 Units into the skin daily.     [name] VASCEPA 1 g capsule TAKE 2 CAPSULES (2 G TOTAL) BY MOUTH 2 (TWO) TIMES DAILY. 120 capsule 3  . Vericiguat (VERQUVO) 2.5 MG TABS Take 2.5 mg by mouth daily. 30 tablet 6   No current facility-administered medications for this visit.    SURGICAL HISTORY:  Past Surgical History:  Procedure Laterality Date  . ABDOMINAL AORTOGRAM W/LOWER EXTREMITY N/A 04/18/2020   Procedure: ABDOMINAL AORTOGRAM W/LOWER EXTREMITY;  Surgeon: Lorretta Harp, MD;  Location: [facility];  Service: Cardiovascular;  Laterality: N/A;  . APPENDECTOMY    . BIOPSY  02/03/2019   Procedure: BIOPSY;  Surgeon: Ladene Artist, MD;  Location: [facility];  Service: Endoscopy;;  . BREAST BIOPSY Left 2016  . CARDIAC CATHETERIZATION  11/29/05   EF of 55%  . CARDIAC CATHETERIZATION  08/06/06   EF of 45-50%  . CARDIAC CATHETERIZATION N/A 05/06/2015   Procedure: Right/Left Heart Cath and Coronary/Graft Angiography;  Surgeon: Peter M Martinique, MD;  Location: [facility];  Service: Cardiovascular;  Laterality: N/A;  . CAROTID PTA/STENT INTERVENTION N/A 10/10/2017   Procedure: CAROTID PTA/STENT INTERVENTION - Right;  Surgeon: Serafina Mitchell, MD;  Location: [facility];  Service: Cardiovascular;  Laterality: N/A;  . CERVICAL FUSION  1990  . CHOLECYSTECTOMY    . COLON RESECTION     mass removed and 4 in of colon  . COLONOSCOPY WITH PROPOFOL N/A 07/16/2017   Procedure: COLONOSCOPY WITH PROPOFOL;  Surgeon: Ladene Artist, MD;   Location: [facility];  Service: Endoscopy;  Laterality: N/A;  . CORONARY ARTERY BYPASS GRAFT  12/04/2005   x5 -- left internal mammary artery to the LAD, left radial artery to the ramus intermedius, saphenous vein graft to the obtuse marginal 1, sequential saphenous vein grat to the acute marginal and posterior descending, endoscopic vein harvesting from the left thigh with open vein harvest from right leg  . CORONARY STENT PLACEMENT  08/11/06   PCI of her ciurcumflex/OM vessel  . ESOPHAGOGASTRODUODENOSCOPY (EGD) WITH PROPOFOL N/A 02/03/2019   Procedure: ESOPHAGOGASTRODUODENOSCOPY (EGD) WITH PROPOFOL;  Surgeon: Ladene Artist, MD;  Location: [facility];  Service: Endoscopy;  Laterality: N/A;  . LAPAROTOMY Bilateral 05/19/2015   Procedure: EXPLORATORY LAPAROTOMY WITH BILATERAL SALPINGO OOPHORECTOMY /OMENTECTOMY/SEGMENTAL SIGMOID COLECTOMY ;  Surgeon: Everitt Amber, MD;  Location: [facility];  Service: Gynecology;  Laterality: Bilateral;  . PERIPHERAL VASCULAR BALLOON ANGIOPLASTY  04/18/2020   Procedure: PERIPHERAL VASCULAR BALLOON ANGIOPLASTY;  Surgeon: Lorretta Harp, MD;  Location: [facility];  Service: Cardiovascular;;  rt SFA  atherectomy and DCB  . PERIPHERAL VASCULAR CATHETERIZATION N/A 11/15/2015   Procedure: Carotid PTA/Stent Intervention;  Surgeon: Lorretta Harp, MD;  Location: [facility];  Service: Cardiovascular;  Laterality: N/A;  . PERIPHERAL VASCULAR CATHETERIZATION  11/15/2015   Procedure: Carotid Angiography;  Surgeon: Lorretta Harp, MD;  Location: [facility];  Service: Cardiovascular;;  . RIGHT HEART CATH N/A 01/27/2020   Procedure: RIGHT HEART CATH;  Surgeon: Larey Dresser, MD;  Location: [facility];  Service: Cardiovascular;  Laterality: N/A;    REVIEW OF SYSTEMS:   Review of Systems  Constitutional: Negative for appetite change, chills, fatigue, fever and unexpected weight change.  HENT:   Negative for mouth sores, nosebleeds, sore throat  and trouble swallowing.   Eyes: Negative for eye problems and icterus.  Respiratory: Negative for cough, hemoptysis, shortness of breath and wheezing.   Cardiovascular: Negative for chest pain and leg swelling.  Gastrointestinal: Negative for abdominal pain, constipation, diarrhea, nausea and vomiting.  Genitourinary: Negative for bladder incontinence, difficulty urinating, dysuria, frequency and hematuria.   Musculoskeletal: Negative for back pain, gait problem, neck pain and neck stiffness.  Skin: Negative for itching and rash.  Neurological: Negative for dizziness, extremity weakness, gait problem, headaches, light-headedness and seizures.  Hematological: Negative for adenopathy. Does not bruise/bleed easily.  Psychiatric/Behavioral: Negative for confusion, depression and sleep disturbance. The patient is not nervous/anxious.     PHYSICAL EXAMINATION:  Last menstrual period 09/02/2003.  ECOG PERFORMANCE STATUS: {CHL ONC ECOG [id number]  Physical Exam  Constitutional: Oriented to person, place, and time and well-developed, well-nourished, and in no distress. No distress.  HENT:  Head: Normocephalic and atraumatic.  Mouth/Throat: Oropharynx is clear and moist. No oropharyngeal exudate.  Eyes: Conjunctivae are normal. Right eye exhibits no discharge. Left eye exhibits no discharge. No scleral icterus.  Neck: Normal range of motion. Neck supple.  Cardiovascular: Normal rate, regular rhythm, normal heart sounds and intact distal pulses.   Pulmonary/Chest: Effort normal and breath sounds normal. No respiratory distress. No wheezes. No rales.  Abdominal: Soft. Bowel sounds are normal. Exhibits no distension and no mass. There is no tenderness.  Musculoskeletal: Normal range of motion. Exhibits no edema.  Lymphadenopathy:    No cervical adenopathy.  Neurological: Alert and oriented to person, place, and time. Exhibits normal muscle tone. Gait normal. Coordination normal.  Skin: Skin is  warm and dry. No rash noted. Not diaphoretic. No erythema. No pallor.  Psychiatric: Mood, memory and judgment normal.  Vitals reviewed.  LABORATORY DATA: Lab Results  Component Value Date   WBC 10.2 05/31/2020   HGB 15.4 (H) 05/31/2020   HCT 44.0 05/31/2020   MCV 91.0 05/31/2020   PLT 260.0 05/31/2020      Chemistry      Component Value Date/Time   NA 133 (L) 05/31/2020 1240   NA 135 04/14/2020 1544   NA 135 (L) 02/15/2017 1457   K 3.7 05/31/2020 1240   K 4.8 02/15/2017 1457   CL 89 (L) 05/31/2020 1240   CO2 34 (H) 05/31/2020 1240   CO2 26 02/15/2017 1457   BUN 33 (H) 05/31/2020 1240   BUN 17 04/14/2020 1544   BUN 10.8 02/15/2017 1457   CREATININE 0.93 05/31/2020 1240   CREATININE 0.87 03/31/2020 1358   CREATININE 0.69 05/06/2017 1405   CREATININE 0.7 02/15/2017 1457      Component Value Date/Time   CALCIUM 10.0 05/31/2020 1240   CALCIUM 9.3 02/15/2017 1457   ALKPHOS 69 05/31/2020 1240   ALKPHOS 75 02/15/2017 1457   AST 17 05/31/2020 1240   AST 19 03/31/2020 1358   AST 9 02/15/2017 1457   ALT 12 05/31/2020 1240   ALT 23 03/31/2020 1358   ALT 13 02/15/2017 1457   BILITOT 0.4 05/31/2020 1240   BILITOT 0.4 03/31/2020 1358   BILITOT 0.28 02/15/2017 1457       RADIOGRAPHIC STUDIES:  No results found.   ASSESSMENT/PLAN:  This is a very pleasant 31 year oldCaucasian female with iron deficiency anemia.   She had a repeat CBC, iron studies, and ferritin. Her CBC showed an elevated Hbg at _.  Iron studies note a total iron WNL at _, TIBC at _, and saturation at _%. Her ferritin  is still pending.   Discussed with Dr. Julien Nordmann. We will not arrange for any iron infusions at this time.   The patient was advised to continue to take her oral iron supplement.   We will see her back for a follow up visit in 3 months for evaluation and repeat cbc, iron studies, and ferritin.  The patient was advised to call immediately if she has any concerning symptoms in the  interval. The patient voices understanding of current disease status and treatment options and is in agreement with the current care plan. All questions were answered. The patient knows to call the clinic with any problems, questions or concerns. We can certainly see the patient much sooner if necessary  No orders of the defined types were placed in this encounter.    Jakwan Sally L Robina Hamor, PA-C 06/28/20

## 2020-06-30 ENCOUNTER — Inpatient Hospital Stay: Payer: Medicare Other | Attending: Physician Assistant

## 2020-06-30 ENCOUNTER — Inpatient Hospital Stay: Payer: Medicare Other | Admitting: Physician Assistant

## 2020-06-30 ENCOUNTER — Telehealth: Payer: Self-pay | Admitting: Physician Assistant

## 2020-06-30 NOTE — Telephone Encounter (Signed)
Called pt per 9/30 sch msg - no answer and unable to leave message.

## 2020-07-01 ENCOUNTER — Telehealth: Payer: Self-pay | Admitting: Physician Assistant

## 2020-07-01 ENCOUNTER — Other Ambulatory Visit: Payer: Medicare Other

## 2020-07-01 ENCOUNTER — Ambulatory Visit: Payer: Medicare Other | Admitting: Physician Assistant

## 2020-07-01 NOTE — Telephone Encounter (Signed)
Called pt per 9/30 sch msg - pt aware of appt date and time

## 2020-07-04 ENCOUNTER — Telehealth: Payer: Self-pay

## 2020-07-04 NOTE — Telephone Encounter (Signed)
VM left for Pt advising to arrive at 9:30 am on October 15 for her ICD implant.

## 2020-07-05 ENCOUNTER — Other Ambulatory Visit: Payer: Self-pay | Admitting: Family Medicine

## 2020-07-05 MED FILL — VERQUVO 2.5 MG TABS: 2.5 | 30 days supply | Qty: 30 | Fill #1

## 2020-07-05 NOTE — Progress Notes (Signed)
[facility] OFFICE PROGRESS NOTE  Tower, [name], MD [place] [address]  DIAGNOSIS: Iron Deficiency Anemia  PRIOR THERAPY: Oral iron supplement daily. Discontinued in September 2021  CURRENT THERAPY:  1) Venofer infusions as needed. Last dose on 01/29/20   INTERVAL HISTORY: Diane [name] 63 y.o. female returns to the clinic today for a follow up visit. The patient is feeling fairly well today without any concerning complaints. She received IV iron infusions, her last one being on 01/29/20. She discontinued her oral iron supplement after a recent visit with her PCP about 2 weeks ago. The patient did not notice any appreciable change in her fatigue after getting her iron infusions due to her other chronic conditions which contribute to fatigue.  She did report that she is not as cold as she was in the past when she was anemic. She also reports her dyspnea on exertion which is likely multifactorial secondary to CHF, COPD, etc. She is established with the heart failure clinic and is expecting to have an ICD placed on 07/15/20.   Otherwise today, the patient denies any chest pain or headaches. She reports dizziness associated with episodes of hypotension, she has since had some of her BP medications discontinued.  Denies any abnormal bleeding except for easy bruising due to Plavix and aspirin use. She has some bruising on her upper extremities. Her last endoscopy was in Spring 2020 and her last colonoscopy was in 2018 both of which were unremarkable.  She had FOBT testing which was negative. She denies any epistaxis, gingival bleeding, hypertension, hematuria, hemoptysis, or hematemesis.  She is here today for evaluation and a repeat CBC, ferritin, and iron studies.  MEDICAL HISTORY: Past Medical History:  Diagnosis Date  . Anemia   . Carotid stenosis    a. s/p right carotid stent 10/2017.  [name] Chronic systolic CHF (congestive heart failure) ([place])   . Colon  polyps   . COPD (chronic obstructive pulmonary disease) ([place])   . Coronary artery disease    post CABG in 3/07 , coronary stents   . Diabetes mellitus    type 2  . Dyslipidemia   . Fibromyalgia   . GERD (gastroesophageal reflux disease)   . Headache    hx of   . Hyperlipidemia   . Hypertension   . Myocardial infarction ([place])   . Pneumonia    hx of   . Seizures ([place])    last seizure- 03/2013   . Shortness of breath dyspnea    with exertion or when fluid builds up   . Sleep apnea    used to wear a cpap- not used in 3 years   . Status post dilation of esophageal narrowing   . Stroke [facility]) 1993   problems with balance   . Systolic murmur    known mild AS and MR  . Thyroid goiter   . Tobacco abuse     ALLERGIES:  is allergic to bee venom, benadryl [diphenhydramine], entresto [sacubitril-valsartan], farxiga [dapagliflozin], tetracycline, and ace inhibitors.  MEDICATIONS:  Current Outpatient Medications  Medication Sig Dispense Refill  . acetaminophen (TYLENOL) 500 MG tablet Take 1,000 mg by mouth every 6 (six) hours as needed for moderate pain or headache.    . albuterol (VENTOLIN HFA) 108 (90 Base) MCG/ACT inhaler INHALE 2 PUFFS EVERY 6 HOURS AS NEEDED FOR WHEEZING/SHORTNESS OF BREATH (Patient taking differently: Inhale 2 puffs into the lungs every 6 (six) hours as needed for wheezing  or shortness of breath. ) 18 g 1  . aspirin EC 81 MG tablet Take 81 mg by mouth every evening.  30 tablet 6  . clopidogrel (PLAVIX) 75 MG tablet Take 1 tablet (75 mg total) by mouth daily. 90 tablet 3  . cyclobenzaprine (FLEXERIL) 10 MG tablet Take 0.5-1 tablets (5-10 mg total) by mouth at bedtime. For pain 30 tablet 1  . DEXILANT 60 MG capsule TAKE 1 CAPSULE DAILY (Patient taking differently: Take 60 mg by mouth daily. ) 90 capsule 1  . digoxin (LANOXIN) 0.125 MG tablet TAKE 0.5 TABLETS (0.0625 MG TOTAL) BY MOUTH DAILY. 45 tablet 3  . ezetimibe (ZETIA) 10 MG tablet Take 1 tablet (10 mg total) by  mouth daily. 30 tablet 6  . fluticasone (FLONASE) 50 MCG/ACT nasal spray Place 2 sprays into both nostrils daily. 48 g 1  . gabapentin (NEURONTIN) 300 MG capsule TAKE 2 CAPSULES BY MOUTH EVERY DAY AT BEDTIME (Patient taking differently: Take 600 mg by mouth at bedtime. ) 180 capsule 1  . glimepiride (AMARYL) 4 MG tablet Take 4 mg by mouth in the morning and at bedtime.    . Glycopyrrolate-Formoterol (BEVESPI AEROSPHERE) 9-4.8 MCG/ACT AERO Inhale 2 puffs into the lungs 2 (two) times daily. 32.1 g 1  . guaiFENesin (MUCINEX) 600 MG 12 hr tablet Take 2 tablets (1,200 mg total) by mouth 2 (two) times daily. 360 tablet 3  . iron polysaccharides (NU-IRON) 150 MG capsule Take 1 capsule (150 mg total) by mouth 2 (two) times daily. 180 capsule 3  . isosorbide mononitrate (IMDUR) 30 MG 24 hr tablet Take 30 mg by mouth daily.     [name] levETIRAcetam (KEPPRA) 1000 MG tablet Take 1 tablet (1,000 mg total) by mouth 2 (two) times daily. 180 tablet 3  . metFORMIN (GLUCOPHAGE-XR) 500 MG 24 hr tablet Take 500 mg by mouth every evening.   6  . metolazone (ZAROXOLYN) 2.5 MG tablet Take 1 tablet (2.5 mg total) by mouth 2 (two) times a week. Every Mon and Fri 15 tablet 4  . nicotine (NICODERM CQ - DOSED IN MG/24 HOURS) 21 mg/24hr patch Place 21 mg onto the skin daily.    . nicotine polacrilex (NICORETTE) 4 MG gum Take 1 each (4 mg total) by mouth as needed for smoking cessation. 90 tablet 2  . NYSTATIN powder APPLY 1 APPLICATION        TOPICALLY 4    TIMES A     DAY AS NEEDED    FOR YEAST    INFECTIONS (Patient taking differently: Apply 1 application topically 4 (four) times daily as needed (yeast innfection). ) 45 g 1  . ONE TOUCH ULTRA TEST test strip USE AS DIRECTED FOR TESTING BLOOD GLUCOSE 3 TIMES DAILY  1  . polyethylene glycol (MIRALAX / GLYCOLAX) 17 g packet Take 17 g by mouth daily.    . potassium chloride SA (KLOR-CON) 20 MEQ tablet Take 1 tablet (20 mEq total) by mouth 2 (two) times daily.    . promethazine  (PHENERGAN) 25 MG tablet Take 1 tablet (25 mg total) by mouth every 8 (eight) hours as needed for nausea or vomiting. Caution of sedation 30 tablet 1  . ranolazine (RANEXA) 1000 MG SR tablet Take 1 tablet (1,000 mg total) by mouth 2 (two) times daily. 180 tablet 3  . rosuvastatin (CRESTOR) 20 MG tablet Take 1 tablet (20 mg total) by mouth daily. 30 tablet 6  . sodium chloride (OCEAN) 0.65 % SOLN nasal spray Place  1 spray into both nostrils as needed for congestion.    [name] spironolactone (ALDACTONE) 25 MG tablet Take 1 tablet (25 mg total) by mouth at bedtime. 90 tablet 3  . torsemide (DEMADEX) 20 MG tablet TAKE 3 TABLETS (60 MG TOTAL) BY MOUTH EVERY MORNING AND 2 TABLETS (40 MG TOTAL) EVERY EVENING. (Patient taking differently: TAKE 60 MG BY MOUTH EVERY MORNING AND 40 MG EVERY EVENING.) 450 tablet 3  . TRESIBA FLEXTOUCH 100 UNIT/ML FlexTouch Pen Inject 35 Units into the skin daily.     [name] VASCEPA 1 g capsule TAKE 2 CAPSULES (2 G TOTAL) BY MOUTH 2 (TWO) TIMES DAILY. (Patient taking differently: Take 2 g by mouth 2 (two) times daily. ) 120 capsule 3  . Vericiguat (VERQUVO) 2.5 MG TABS Take 2.5 mg by mouth daily. 30 tablet 6  . diphenhydrAMINE (BENADRYL) 25 mg capsule Take 25 mg by mouth 2 (two) times daily as needed for itching or allergies.  (Patient not taking: Reported on 07/07/2020)    . HYDROcodone-acetaminophen (NORCO) 5-325 MG tablet Take 1 tablet by mouth daily as needed for severe pain. (Patient not taking: Reported on 07/07/2020) 15 tablet 0  . nitroGLYCERIN (NITROLINGUAL) 0.4 MG/SPRAY spray Place 1 spray under the tongue every 5 (five) minutes x 3 doses as needed for chest pain. (Patient not taking: Reported on 07/07/2020) 12 g 3   No current facility-administered medications for this visit.    SURGICAL HISTORY:  Past Surgical History:  Procedure Laterality Date  . ABDOMINAL AORTOGRAM W/LOWER EXTREMITY N/A 04/18/2020   Procedure: ABDOMINAL AORTOGRAM W/LOWER EXTREMITY;  Surgeon: Lorretta Harp, MD;  Location: [facility];  Service: Cardiovascular;  Laterality: N/A;  . APPENDECTOMY    . BIOPSY  02/03/2019   Procedure: BIOPSY;  Surgeon: Ladene Artist, MD;  Location: [facility];  Service: Endoscopy;;  . BREAST BIOPSY Left 2016  . CARDIAC CATHETERIZATION  11/29/05   EF of 55%  . CARDIAC CATHETERIZATION  08/06/06   EF of 45-50%  . CARDIAC CATHETERIZATION N/A 05/06/2015   Procedure: Right/Left Heart Cath and Coronary/Graft Angiography;  Surgeon: Peter M Martinique, MD;  Location: [facility];  Service: Cardiovascular;  Laterality: N/A;  . CAROTID PTA/STENT INTERVENTION N/A 10/10/2017   Procedure: CAROTID PTA/STENT INTERVENTION - Right;  Surgeon: Serafina Mitchell, MD;  Location: [facility];  Service: Cardiovascular;  Laterality: N/A;  . CERVICAL FUSION  1990  . CHOLECYSTECTOMY    . COLON RESECTION     mass removed and 4 in of colon  . COLONOSCOPY WITH PROPOFOL N/A 07/16/2017   Procedure: COLONOSCOPY WITH PROPOFOL;  Surgeon: Ladene Artist, MD;  Location: [facility];  Service: Endoscopy;  Laterality: N/A;  . CORONARY ARTERY BYPASS GRAFT  12/04/2005   x5 -- left internal mammary artery to the LAD, left radial artery to the ramus intermedius, saphenous vein graft to the obtuse marginal 1, sequential saphenous vein grat to the acute marginal and posterior descending, endoscopic vein harvesting from the left thigh with open vein harvest from right leg  . CORONARY STENT PLACEMENT  08/11/06   PCI of her ciurcumflex/OM vessel  . ESOPHAGOGASTRODUODENOSCOPY (EGD) WITH PROPOFOL N/A 02/03/2019   Procedure: ESOPHAGOGASTRODUODENOSCOPY (EGD) WITH PROPOFOL;  Surgeon: Ladene Artist, MD;  [facility];  Service: Endoscopy;  Laterality: N/A;  . LAPAROTOMY Bilateral 05/19/2015   Procedure: EXPLORATORY LAPAROTOMY WITH BILATERAL SALPINGO OOPHORECTOMY /OMENTECTOMY/SEGMENTAL SIGMOID COLECTOMY ;  Surgeon: Everitt Amber, MD;  Location: [facility];  Service: Gynecology;  Laterality:  Bilateral;  . PERIPHERAL VASCULAR BALLOON ANGIOPLASTY  04/18/2020   Procedure: PERIPHERAL VASCULAR BALLOON ANGIOPLASTY;  Surgeon: Lorretta Harp, MD;  Location: [facility];  Service: Cardiovascular;;  rt SFA  atherectomy and DCB  . PERIPHERAL VASCULAR CATHETERIZATION N/A 11/15/2015   Procedure: Carotid PTA/Stent Intervention;  Surgeon: Lorretta Harp, MD;  Location: [facility];  Service: Cardiovascular;  Laterality: N/A;  . PERIPHERAL VASCULAR CATHETERIZATION  11/15/2015   Procedure: Carotid Angiography;  Surgeon: Lorretta Harp, MD;  Location: [facility];  Service: Cardiovascular;;  . RIGHT HEART CATH N/A 01/27/2020   Procedure: RIGHT HEART CATH;  Surgeon: Larey Dresser, MD;  Location: [facility];  Service: Cardiovascular;  Laterality: N/A;   REVIEW OF SYSTEMS:   Constitutional:Positive for fatigue.Negative for appetite change, chills, fever and unexpected weight change.  HENT: Negative for mouth sores, nosebleeds, sore throat and trouble swallowing.  Eyes: Negative for eye problems and icterus.  and wheezing.  Cardiovascular: Negative for chest pain and leg swelling.  Gastrointestinal: Negative for abdominal pain, constipation, diarrhea, nausea and vomiting.  Genitourinary: Negative for bladder incontinence, difficulty urinating, dysuria, frequency and hematuria.  Musculoskeletal: Negative for back pain, gait problem, neck pain and neck stiffness.  Skin: Negative for itching and rash.  Neurological: Negative for dizziness, extremity weakness, gait problem, headaches, light-headedness and seizures.  Hematological:Positive for easy bruising on extremities due to blood thinner.Negative for adenopathy.  Psychiatric/Behavioral: Negative for confusion, depression and sleep disturbance. The patient is not nervous/anxious.     PHYSICAL EXAMINATION:  Blood pressure 116/68, pulse 81, temperature (!) 97.4 F (36.3 C), temperature source Tympanic,  resp. rate 17, height 4\' 10"  (1.473 m), weight 128 lb (58.1 kg), last menstrual period 09/02/2003, SpO2 100 %.  ECOG PERFORMANCE STATUS: 1 - Symptomatic but completely ambulatory  Physical Exam  Constitutional: Oriented to person, place, and time and well-developed, well-nourished, and in no distress.  HENT:  Head: Normocephalic and atraumatic.  Mouth/Throat: Oropharynx is clear and moist. No oropharyngeal exudate.  Eyes: Conjunctivae are normal. Right eye exhibits no discharge. Left eye exhibits no discharge. No scleral icterus.  Neck: Normal range of motion. Neck supple.  Cardiovascular: Normal rate, regular rhythm, normal heart sounds and intact distal pulses.  Pulmonary/Chest: Effort normal and breath sounds normal. No respiratory distress. No wheezes. No rales.  Abdominal: Soft. Bowel sounds are normal. Exhibits no distension and no mass. There is no tenderness.  Musculoskeletal: Normal range of motion. Exhibits no edema.  Lymphadenopathy:    No cervical adenopathy.  Neurological: Alert and oriented to person, place, and time. Exhibits normal muscle tone. Gait normal. Coordination normal.  Skin: Bruising on her upper extremities bilaterally. Skin is warm and dry. No rash noted. Not diaphoretic. No erythema. No pallor.  Psychiatric: Mood, memory and judgment normal.  Vitals reviewed.  LABORATORY DATA: Lab Results  Component Value Date   WBC 7.4 07/07/2020   HGB 14.7 07/07/2020   HCT 43.2 07/07/2020   MCV 89.8 07/07/2020   PLT 222 07/07/2020      Chemistry      Component Value Date/Time   NA 137 07/07/2020 1416   NA 135 04/14/2020 1544   NA 135 (L) 02/15/2017 1457   K 4.6 07/07/2020 1416   K 4.8 02/15/2017 1457   CL 97 (L) 07/07/2020 1416   CO2 34 (H) 07/07/2020 1416   CO2 26 02/15/2017 1457   BUN 21 07/07/2020 1416   BUN 17 04/14/2020 1544  BUN 10.8 02/15/2017 1457   CREATININE 0.86 07/07/2020 1416   CREATININE 0.69 05/06/2017 1405   CREATININE 0.7 02/15/2017 1457       Component Value Date/Time   CALCIUM 9.5 07/07/2020 1416   CALCIUM 9.3 02/15/2017 1457   ALKPHOS 77 07/07/2020 1416   ALKPHOS 75 02/15/2017 1457   AST 14 (L) 07/07/2020 1416   AST 9 02/15/2017 1457   ALT 14 07/07/2020 1416   ALT 13 02/15/2017 1457   BILITOT 0.4 07/07/2020 1416   BILITOT 0.28 02/15/2017 1457       RADIOGRAPHIC STUDIES:  No results found.   ASSESSMENT/PLAN:  This is a very pleasant 54 year oldCaucasian female with iron deficiency anemia.   She had a repeat CBC, iron studies, and ferritin. Her CBC showeda normal Hbg at 14.7. Iron studies note a total iron WNL at 75, elevated TIBC at 546, and saturation low at 14%. Her ferritin is on the low end of normal at 12.  Discussed with Dr. Julien Nordmann. We will not arrange for any iron infusions at this time. However, we would encourage the patient to resume taking her iron supplement 2-3 times per week if she can tolerate it.  We offered to release the patient to follow with her PCP and that we can see her on an as needed basis; however, the patient states she is not scheduled to see her PCP until July 2022.  Therefore, will see her back for a follow up visit in 6 months for evaluation and repeat CBC, iron studies, and ferritin.  The patient was advised to call immediately if she has any concerning symptoms in the interval. The patient voices understanding of current disease status and treatment options and is in agreement with the current care plan. All questions were answered. The patient knows to call the clinic with any problems, questions or concerns. We can certainly see the patient much sooner if necessary     Orders Placed This Encounter  Procedures  . CBC with Differential (Cancer Center Only)    Standing Status:   Future    Standing Expiration Date:   07/07/2021  . Ferritin    Standing Status:   Future    Standing Expiration Date:   07/07/2021  . Iron and TIBC    Standing Status:   Future     Standing Expiration Date:   07/07/2021     Tyreisha Ungar L Avilyn Virtue, PA-C 07/07/20

## 2020-07-05 NOTE — Telephone Encounter (Signed)
Last filled on 04/18/20 #45 g with 1 refill, please advise  45 g 1 04/18/2020

## 2020-07-07 ENCOUNTER — Other Ambulatory Visit: Payer: Self-pay

## 2020-07-07 ENCOUNTER — Inpatient Hospital Stay: Payer: Medicare Other | Attending: Physician Assistant | Admitting: Physician Assistant

## 2020-07-07 ENCOUNTER — Inpatient Hospital Stay: Payer: Medicare Other

## 2020-07-07 VITALS — BP 116/68 | HR 81 | Temp 97.4°F | Resp 17 | Ht <= 58 in | Wt 128.0 lb

## 2020-07-07 DIAGNOSIS — J449 Chronic obstructive pulmonary disease, unspecified: Secondary | ICD-10-CM | POA: Insufficient documentation

## 2020-07-07 DIAGNOSIS — I5022 Chronic systolic (congestive) heart failure: Secondary | ICD-10-CM | POA: Insufficient documentation

## 2020-07-07 DIAGNOSIS — D509 Iron deficiency anemia, unspecified: Secondary | ICD-10-CM | POA: Diagnosis not present

## 2020-07-07 DIAGNOSIS — I110 Hypertensive heart disease with heart failure: Secondary | ICD-10-CM | POA: Diagnosis not present

## 2020-07-07 DIAGNOSIS — I2510 Atherosclerotic heart disease of native coronary artery without angina pectoris: Secondary | ICD-10-CM | POA: Insufficient documentation

## 2020-07-07 DIAGNOSIS — Z8719 Personal history of other diseases of the digestive system: Secondary | ICD-10-CM | POA: Diagnosis not present

## 2020-07-07 DIAGNOSIS — Z79899 Other long term (current) drug therapy: Secondary | ICD-10-CM | POA: Insufficient documentation

## 2020-07-07 DIAGNOSIS — Z8673 Personal history of transient ischemic attack (TIA), and cerebral infarction without residual deficits: Secondary | ICD-10-CM | POA: Diagnosis not present

## 2020-07-07 LAB — CMP (CANCER CENTER ONLY)
ALT: 14 U/L (ref 0–44)
AST: 14 U/L — ABNORMAL LOW (ref 15–41)
Albumin: 3.6 g/dL (ref 3.5–5.0)
Alkaline Phosphatase: 77 U/L (ref 38–126)
Anion gap: 6 (ref 5–15)
BUN: 21 mg/dL (ref 8–23)
CO2: 34 mmol/L — ABNORMAL HIGH (ref 22–32)
Calcium: 9.5 mg/dL (ref 8.9–10.3)
Chloride: 97 mmol/L — ABNORMAL LOW (ref 98–111)
Creatinine: 0.86 mg/dL (ref 0.44–1.00)
GFR, Estimated: >60 mL/min (ref >60)
Glucose, Bld: 246 mg/dL — ABNORMAL HIGH (ref 70–99)
Potassium: 4.6 mmol/L (ref 3.5–5.1)
Sodium: 137 mmol/L (ref 135–145)
Total Bilirubin: 0.4 mg/dL (ref 0.3–1.2)
Total Protein: 7 g/dL (ref 6.5–8.1)

## 2020-07-07 LAB — CBC WITH DIFFERENTIAL (CANCER CENTER ONLY)
Abs Immature Granulocytes: 0.01 10*3/uL (ref 0.00–0.07)
Basophils Absolute: 0 10*3/uL (ref 0.0–0.1)
Basophils Relative: 0 %
Eosinophils Absolute: 0.1 10*3/uL (ref 0.0–0.5)
Eosinophils Relative: 1 %
HCT: 43.2 % (ref 36.0–46.0)
Hemoglobin: 14.7 g/dL (ref 12.0–15.0)
Immature Granulocytes: 0 %
Lymphocytes Relative: 23 %
Lymphs Abs: 1.7 10*3/uL (ref 0.7–4.0)
MCH: 30.6 pg (ref 26.0–34.0)
MCHC: 34 g/dL (ref 30.0–36.0)
MCV: 89.8 fL (ref 80.0–100.0)
Monocytes Absolute: 0.6 10*3/uL (ref 0.1–1.0)
Monocytes Relative: 7 %
Neutro Abs: 5.1 10*3/uL (ref 1.7–7.7)
Neutrophils Relative %: 69 %
Platelet Count: 222 10*3/uL (ref 150–400)
RBC: 4.81 MIL/uL (ref 3.87–5.11)
RDW: 13.2 % (ref 11.5–15.5)
WBC Count: 7.4 10*3/uL (ref 4.0–10.5)
nRBC: 0 % (ref 0.0–0.2)

## 2020-07-07 LAB — FERRITIN: Ferritin: 12 ng/mL (ref 11–307)

## 2020-07-07 LAB — IRON AND TIBC
Iron: 75 ug/dL (ref 41–142)
Saturation Ratios: 14 % — ABNORMAL LOW (ref 21–57)
TIBC: 546 ug/dL — ABNORMAL HIGH (ref 236–444)
UIBC: 471 ug/dL — ABNORMAL HIGH (ref 120–384)

## 2020-07-12 ENCOUNTER — Telehealth: Payer: Self-pay | Admitting: Physician Assistant

## 2020-07-12 NOTE — Telephone Encounter (Signed)
Scheduled per los. Called and left msg. Mailed printout  °

## 2020-07-13 ENCOUNTER — Other Ambulatory Visit (HOSPITAL_COMMUNITY)
Admission: RE | Admit: 2020-07-13 | Discharge: 2020-07-13 | Disposition: A | Discharge status: Home / Self Care | Payer: Medicare Other | Source: Ambulatory Visit | Attending: Cardiology | Admitting: Cardiology

## 2020-07-13 DIAGNOSIS — Z20822 Contact with and (suspected) exposure to covid-19: Secondary | ICD-10-CM | POA: Diagnosis not present

## 2020-07-13 DIAGNOSIS — Z01812 Encounter for preprocedural laboratory examination: Secondary | ICD-10-CM | POA: Insufficient documentation

## 2020-07-13 LAB — SARS CORONAVIRUS 2 (TAT 6-24 HRS): SARS Coronavirus 2: NEGATIVE

## 2020-07-14 NOTE — Progress Notes (Signed)
Left voicemail with the following instructions for tomorrows procedure. Instructed patient on the following items: Arrival time 0930 Nothing to eat or drink after midnight No meds AM of procedure Responsible person to drive you home and stay with you for 24 hrs Wash with special soap night before and morning of procedure

## 2020-07-15 ENCOUNTER — Encounter (HOSPITAL_COMMUNITY)
Admission: RE | Disposition: A | Discharge status: Home / Self Care | Payer: Medicare Other | Source: Home / Self Care | Attending: Cardiology

## 2020-07-15 ENCOUNTER — Ambulatory Visit (HOSPITAL_COMMUNITY)
Admission: RE | Admit: 2020-07-15 | Discharge: 2020-07-15 | Disposition: A | Discharge status: Home / Self Care | Payer: Medicare Other | Attending: Cardiology | Admitting: Cardiology

## 2020-07-15 ENCOUNTER — Other Ambulatory Visit: Payer: Self-pay

## 2020-07-15 ENCOUNTER — Ambulatory Visit (HOSPITAL_COMMUNITY): Payer: Medicare Other

## 2020-07-15 DIAGNOSIS — I6521 Occlusion and stenosis of right carotid artery: Secondary | ICD-10-CM | POA: Diagnosis not present

## 2020-07-15 DIAGNOSIS — I5030 Unspecified diastolic (congestive) heart failure: Secondary | ICD-10-CM | POA: Diagnosis not present

## 2020-07-15 DIAGNOSIS — I255 Ischemic cardiomyopathy: Secondary | ICD-10-CM | POA: Insufficient documentation

## 2020-07-15 DIAGNOSIS — I252 Old myocardial infarction: Secondary | ICD-10-CM | POA: Diagnosis not present

## 2020-07-15 DIAGNOSIS — I110 Hypertensive heart disease with heart failure: Secondary | ICD-10-CM | POA: Diagnosis not present

## 2020-07-15 DIAGNOSIS — Z7982 Long term (current) use of aspirin: Secondary | ICD-10-CM | POA: Diagnosis not present

## 2020-07-15 DIAGNOSIS — I5022 Chronic systolic (congestive) heart failure: Secondary | ICD-10-CM | POA: Diagnosis not present

## 2020-07-15 DIAGNOSIS — J449 Chronic obstructive pulmonary disease, unspecified: Secondary | ICD-10-CM | POA: Diagnosis not present

## 2020-07-15 DIAGNOSIS — J90 Pleural effusion, not elsewhere classified: Secondary | ICD-10-CM | POA: Diagnosis not present

## 2020-07-15 DIAGNOSIS — Z95810 Presence of automatic (implantable) cardiac defibrillator: Secondary | ICD-10-CM

## 2020-07-15 DIAGNOSIS — Z955 Presence of coronary angioplasty implant and graft: Secondary | ICD-10-CM | POA: Insufficient documentation

## 2020-07-15 DIAGNOSIS — G4730 Sleep apnea, unspecified: Secondary | ICD-10-CM | POA: Insufficient documentation

## 2020-07-15 DIAGNOSIS — Z888 Allergy status to other drugs, medicaments and biological substances status: Secondary | ICD-10-CM | POA: Diagnosis not present

## 2020-07-15 DIAGNOSIS — K219 Gastro-esophageal reflux disease without esophagitis: Secondary | ICD-10-CM | POA: Diagnosis not present

## 2020-07-15 DIAGNOSIS — R569 Unspecified convulsions: Secondary | ICD-10-CM | POA: Diagnosis not present

## 2020-07-15 DIAGNOSIS — I25118 Atherosclerotic heart disease of native coronary artery with other forms of angina pectoris: Secondary | ICD-10-CM | POA: Insufficient documentation

## 2020-07-15 DIAGNOSIS — E785 Hyperlipidemia, unspecified: Secondary | ICD-10-CM | POA: Diagnosis not present

## 2020-07-15 DIAGNOSIS — M797 Fibromyalgia: Secondary | ICD-10-CM | POA: Insufficient documentation

## 2020-07-15 DIAGNOSIS — Z7902 Long term (current) use of antithrombotics/antiplatelets: Secondary | ICD-10-CM | POA: Diagnosis not present

## 2020-07-15 DIAGNOSIS — E119 Type 2 diabetes mellitus without complications: Secondary | ICD-10-CM | POA: Diagnosis not present

## 2020-07-15 DIAGNOSIS — Z881 Allergy status to other antibiotic agents status: Secondary | ICD-10-CM | POA: Insufficient documentation

## 2020-07-15 DIAGNOSIS — Z8673 Personal history of transient ischemic attack (TIA), and cerebral infarction without residual deficits: Secondary | ICD-10-CM | POA: Insufficient documentation

## 2020-07-15 DIAGNOSIS — Z951 Presence of aortocoronary bypass graft: Secondary | ICD-10-CM | POA: Insufficient documentation

## 2020-07-15 DIAGNOSIS — F17210 Nicotine dependence, cigarettes, uncomplicated: Secondary | ICD-10-CM | POA: Insufficient documentation

## 2020-07-15 DIAGNOSIS — I517 Cardiomegaly: Secondary | ICD-10-CM | POA: Diagnosis not present

## 2020-07-15 DIAGNOSIS — Z006 Encounter for examination for normal comparison and control in clinical research program: Secondary | ICD-10-CM | POA: Insufficient documentation

## 2020-07-15 DIAGNOSIS — I5042 Chronic combined systolic (congestive) and diastolic (congestive) heart failure: Secondary | ICD-10-CM | POA: Diagnosis not present

## 2020-07-15 HISTORY — PX: ICD IMPLANT: EP1208

## 2020-07-15 LAB — GLUCOSE, CAPILLARY
Glucose-Capillary: 172 mg/dL — ABNORMAL HIGH (ref 70–99)
Glucose-Capillary: 201 mg/dL — ABNORMAL HIGH (ref 70–99)

## 2020-07-15 SURGERY — ICD IMPLANT
Anesthesia: LOCAL

## 2020-07-15 MED ORDER — MIDAZOLAM HCL 5 MG/5ML IJ SOLN
INTRAMUSCULAR | Status: DC | PRN
  Administered 2020-07-15 (×2): 0.5 mg via INTRAVENOUS

## 2020-07-15 MED ORDER — HEPARIN (PORCINE) IN NACL 1000-0.9 UT/500ML-% IV SOLN
INTRAVENOUS | Status: AC
  Filled 2020-07-15: qty 500

## 2020-07-15 MED ORDER — LIDOCAINE HCL (PF) 1 % IJ SOLN
INTRAMUSCULAR | Status: AC
  Filled 2020-07-15: qty 60

## 2020-07-15 MED ORDER — CHLORHEXIDINE GLUCONATE 4 % EX LIQD
4.0000 "application " | Freq: Once | CUTANEOUS | Status: DC
  Filled 2020-07-15: qty 60

## 2020-07-15 MED ORDER — HEPARIN (PORCINE) IN NACL 1000-0.9 UT/500ML-% IV SOLN
INTRAVENOUS | Status: DC | PRN
  Administered 2020-07-15: 500 mL

## 2020-07-15 MED ORDER — SODIUM CHLORIDE 0.9 % IV SOLN
INTRAVENOUS | Status: AC
  Filled 2020-07-15: qty 2

## 2020-07-15 MED ORDER — MIDAZOLAM HCL 5 MG/5ML IJ SOLN
INTRAMUSCULAR | Status: AC
  Filled 2020-07-15: qty 5

## 2020-07-15 MED ORDER — SODIUM CHLORIDE 0.9 % IV SOLN: INTRAVENOUS | Status: DC

## 2020-07-15 MED ORDER — CLOPIDOGREL BISULFATE 75 MG PO TABS
75.0000 mg | ORAL_TABLET | Freq: Every day | ORAL | 3 refills | Status: DC
Start: 2020-07-18 — End: 2020-08-03

## 2020-07-15 MED ORDER — FENTANYL CITRATE (PF) 100 MCG/2ML IJ SOLN
INTRAMUSCULAR | Status: AC
  Filled 2020-07-15: qty 2

## 2020-07-15 MED ORDER — FENTANYL CITRATE (PF) 100 MCG/2ML IJ SOLN
INTRAMUSCULAR | Status: DC | PRN
Start: 2020-07-15 — End: 2020-07-15
  Administered 2020-07-15 (×2): 12.5 ug via INTRAVENOUS

## 2020-07-15 MED ORDER — CEFAZOLIN SODIUM-DEXTROSE 2-4 GM/100ML-% IV SOLN
INTRAVENOUS | Status: AC
  Filled 2020-07-15: qty 100

## 2020-07-15 MED ORDER — ONDANSETRON HCL 4 MG/2ML IJ SOLN: 4.0000 mg | Freq: Four times a day (QID) | INTRAMUSCULAR | Status: DC | PRN

## 2020-07-15 MED ORDER — SODIUM CHLORIDE 0.9 % IV SOLN
80.0000 mg | INTRAVENOUS | Status: AC
  Administered 2020-07-15: 80 mg
  Filled 2020-07-15: qty 2

## 2020-07-15 MED ORDER — LIDOCAINE HCL (PF) 1 % IJ SOLN
INTRAMUSCULAR | Status: DC | PRN
  Administered 2020-07-15: 45 mL

## 2020-07-15 MED ORDER — ACETAMINOPHEN 325 MG PO TABS
325.0000 mg | ORAL_TABLET | ORAL | Status: DC | PRN
  Filled 2020-07-15: qty 2

## 2020-07-15 MED ORDER — CEFAZOLIN SODIUM-DEXTROSE 2-4 GM/100ML-% IV SOLN
2.0000 g | INTRAVENOUS | Status: AC
  Administered 2020-07-15: 2 g via INTRAVENOUS

## 2020-07-15 SURGICAL SUPPLY — 7 items
CABLE SURGICAL S-101-97-12 (CABLE) ×2 IMPLANT
ICD VIGILANT VR D232 (Pacemaker) ×1 IMPLANT
LEAD RELIANCE 0672 ×1 IMPLANT
PAD PRO RADIOLUCENT 2001M-C (PAD) ×2 IMPLANT
SHEATH 8FR PRELUDE SNAP 13 (SHEATH) ×1 IMPLANT
SHEATH PROBE COVER 6X72 (BAG) ×1 IMPLANT
TRAY PACEMAKER INSERTION (PACKS) ×2 IMPLANT

## 2020-07-15 NOTE — Discharge Instructions (Signed)
° ° °  Supplemental Discharge Instructions for  Pacemaker/Defibrillator Patients  Tomorrow, 07/16/2020, send in a device transmission  Activity No heavy lifting or vigorous activity with your left/right arm for 6 to 8 weeks.  Do not raise your left/right arm above your head for one week.  Gradually raise your affected arm as drawn below.             07/19/2020               07/20/2020             07/21/2020              07/22/2020 __  NO DRIVING for  1 week  ; you may begin driving on   10/93/2355  .  WOUND CARE - Keep the wound area clean and dry.  Do not get this area wet , no showers for one week; you may shower on 07/22/2020 . - Tomorrow, 10/156/21, remove the arm sling - Tomorrow, 07/16/2020 remove the outer plastic bandage.  Underneath the plastic bandage there are steris strips (paper tapes), DO NOT remove these. - The tape/steri-strips on your wound will fall off; do not pull them off.  No bandage is needed on the site.  DO  NOT apply any creams, oils, or ointments to the wound area. - If you notice any drainage or discharge from the wound, any swelling or bruising at the site, or you develop a fever > 101? F after you are discharged home, call the office at once.  Special Instructions - You are still able to use cellular telephones; use the ear opposite the side where you have your pacemaker/defibrillator.  Avoid carrying your cellular phone near your device. - When traveling through airports, show security personnel your identification card to avoid being screened in the metal detectors.  Ask the security personnel to use the hand wand. - Avoid arc welding equipment, MRI testing (magnetic resonance imaging), TENS units (transcutaneous nerve stimulators).  Call the office for questions about other devices. - Avoid electrical appliances that are in poor condition or are not properly grounded. - Microwave ovens are safe to be near or to operate.  Additional information for  defibrillator patients should your device go off: - If your device goes off ONCE and you feel fine afterward, notify the device clinic nurses. - If your device goes off ONCE and you do not feel well afterward, call 911. - If your device goes off TWICE, call 911. - If your device goes off THREE times in one day, call 911.  DO NOT DRIVE YOURSELF OR A FAMILY MEMBER WITH A DEFIBRILLATOR TO THE HOSPITAL--CALL 911.

## 2020-07-15 NOTE — H&P (Signed)
Electrophysiology Office Note:    Date:  07/15/2020  ID:  Diane [name], DOB 1956/03/02, MRN [id number]  PCP:  [name], MD            [facility] Cardiologist:  Peter Martinique, MD  [facility] Electrophysiologist:  Vickie Epley, MD   Referring MD: Larey Dresser, MD   Chief Complaint: ICM, eval ICD  History of Present Illness:    Diane [name] is a 64 y.o. female with a hx of ischemic cardiomyopathy who presents to clinic to discuss ICD implant at the request of Dr. Aundra Dubin.  Patient has a history of coronary artery disease status post CABG in 2007 (all grafts close except LIMA to LAD), stroke, valvular heart disease (moderate to severe MR, moderate TR).  She has been previously evaluated for LVAD at [facility] and is awaiting a second opinion regarding LVAD implant from [facility].      Past Medical History:  Diagnosis Date  . Anemia   . Carotid stenosis    a. s/p right carotid stent 10/2017.  [name] Chronic systolic CHF (congestive heart failure) ([place])   . Colon polyps   . COPD (chronic obstructive pulmonary disease) ([place])   . Coronary artery disease    post CABG in 3/07 , coronary stents   . Diabetes mellitus    type 2  . Dyslipidemia   . Fibromyalgia   . GERD (gastroesophageal reflux disease)   . Headache    hx of   . Hyperlipidemia   . Hypertension   . Myocardial infarction ([employer])   . Pneumonia    hx of   . Seizures ([place])    last seizure- 03/2013   . Shortness of breath dyspnea    with exertion or when fluid builds up   . Sleep apnea    used to wear a cpap- not used in 3 years   . Status post dilation of esophageal narrowing   . Stroke [facility]) 1993   problems with balance   . Systolic murmur    known mild AS and MR  . Thyroid goiter   . Tobacco abuse          Past Surgical History:  Procedure Laterality Date  . ABDOMINAL AORTOGRAM W/LOWER EXTREMITY N/A 04/18/2020   Procedure: ABDOMINAL AORTOGRAM W/LOWER  EXTREMITY;  Surgeon: Lorretta Harp, MD;  Location: [place] [facility];  Service: Cardiovascular;  Laterality: N/A;  . APPENDECTOMY    . BIOPSY  02/03/2019   Procedure: BIOPSY;  Surgeon: Ladene Artist, MD;  Location: [facility];  Service: Endoscopy;;  . BREAST BIOPSY Left 2016  . CARDIAC CATHETERIZATION  11/29/05   EF of 55%  . CARDIAC CATHETERIZATION  08/06/06   EF of 45-50%  . CARDIAC CATHETERIZATION N/A 05/06/2015   Procedure: Right/Left Heart Cath and Coronary/Graft Angiography;  Surgeon: Peter M Martinique, MD;  Location: [place] [facility];  Service: Cardiovascular;  Laterality: N/A;  . CAROTID PTA/STENT INTERVENTION N/A 10/10/2017   Procedure: CAROTID PTA/STENT INTERVENTION - Right;  Surgeon: Serafina Mitchell, MD;  Location: [place] [facility];  Service: Cardiovascular;  Laterality: N/A;  . CERVICAL FUSION  1990  . CHOLECYSTECTOMY    . COLON RESECTION     mass removed and 4 in of colon  . COLONOSCOPY WITH PROPOFOL N/A 07/16/2017   Procedure: COLONOSCOPY WITH PROPOFOL;  Surgeon: Ladene Artist, MD;  [facility]: [facility];  Service: Endoscopy;  Laterality: N/A;  . CORONARY ARTERY BYPASS GRAFT  12/04/2005   x5 -- left internal mammary artery to the LAD, left radial artery to the ramus intermedius, saphenous vein graft to the obtuse marginal 1, sequential saphenous vein grat to the acute marginal and posterior descending, endoscopic vein harvesting from the left thigh with open vein harvest from right leg  . CORONARY STENT PLACEMENT  08/11/06   PCI of her ciurcumflex/OM vessel  . ESOPHAGOGASTRODUODENOSCOPY (EGD) WITH PROPOFOL N/A 02/03/2019   Procedure: ESOPHAGOGASTRODUODENOSCOPY (EGD) WITH PROPOFOL;  Surgeon: Ladene Artist, MD;  Location: [facility];  Service: Endoscopy;  Laterality: N/A;  . LAPAROTOMY Bilateral 05/19/2015   Procedure: EXPLORATORY LAPAROTOMY WITH BILATERAL SALPINGO OOPHORECTOMY /OMENTECTOMY/SEGMENTAL SIGMOID COLECTOMY ;  Surgeon: Everitt Amber, MD;   Location: [facility];  Service: Gynecology;  Laterality: Bilateral;  . PERIPHERAL VASCULAR BALLOON ANGIOPLASTY  04/18/2020   Procedure: PERIPHERAL VASCULAR BALLOON ANGIOPLASTY;  Surgeon: Lorretta Harp, MD;  Location: [place] [facility];  Service: Cardiovascular;;  rt SFA  atherectomy and DCB  . PERIPHERAL VASCULAR CATHETERIZATION N/A 11/15/2015   Procedure: Carotid PTA/Stent Intervention;  Surgeon: Lorretta Harp, MD;  Location: [place] [facility];  Service: Cardiovascular;  Laterality: N/A;  . PERIPHERAL VASCULAR CATHETERIZATION  11/15/2015   Procedure: Carotid Angiography;  Surgeon: Lorretta Harp, MD;  Location: [place] [facility];  Service: Cardiovascular;;  . RIGHT HEART CATH N/A 01/27/2020   Procedure: RIGHT HEART CATH;  Surgeon: Larey Dresser, MD;  Location: [place] [facility];  Service: Cardiovascular;  Laterality: N/A;    Current Medications: Active Medications  No outpatient medications have been marked as taking for the 06/07/20 encounter (Appointment) with Vickie Epley, MD.       Allergies:   Bee venom, Benadryl [diphenhydramine], Entresto [sacubitril-valsartan], Farxiga [dapagliflozin], Tetracycline, and Ace inhibitors   Social History        Socioeconomic History  . Marital status: Divorced    Spouse name: Not on file  . Number of children: 0  . Years of education: Not on file  . Highest education level: Not on file  Occupational History  . Occupation: disabled  Tobacco Use  . Smoking status: Current Every Day Smoker    Packs/day: 1.00    Years: 51.00    Pack years: 51.00    Types: Cigarettes    Start date: 85    Last attempt to quit: 02/22/2020    Years since quitting: 0.2  . Smokeless tobacco: Never Used  Vaping Use  . Vaping Use: Former  Substance and Sexual Activity  . Alcohol use: No  . Drug use: No  . Sexual activity: Not Currently    Partners: Male    Birth control/protection: None  Other Topics Concern  .  Not on file  Social History Narrative   Right handed   Lives alone in a one story home   Social Determinants of Health      Financial Resource Strain:   . Difficulty of Paying Living Expenses: Not on file  Food Insecurity:   . Worried About Charity fundraiser in the Last Year: Not on file  . Ran Out of Food in the Last Year: Not on file  Transportation Needs:   . Lack of Transportation (Medical): Not on file  . Lack of Transportation (Non-Medical): Not on file  Physical Activity:   . Days of Exercise per Week: Not on file  . Minutes of Exercise per Session: Not on file  Stress:   . Feeling of Stress : Not on file  Social Connections:   . Frequency of Communication with Friends and Family: Not on file  . Frequency of Social Gatherings with Friends and Family: Not on file  . Attends Religious Services: Not on file  . Active Member of Clubs or Organizations: Not on file  . Attends Archivist Meetings: Not on file  . Marital Status: Not on file     Family History: The patient's family history includes COPD in her mother; Cancer in her father, paternal uncle, and sister; Diabetes in her mother; Drug abuse in her paternal grandmother; Glaucoma in her sister; Heart attack in her father; Heart disease in her maternal aunt, maternal grandfather, and mother; Hyperlipidemia in her mother; Hypertension in her mother and sister; Lung cancer in her paternal aunt and paternal uncle; Melanoma in her paternal uncle; Stroke in her paternal grandfather.  ROS:   Please see the history of present illness.    All other systems reviewed and are negative.  EKGs/Labs/Other Studies Reviewed:    The following studies were reviewed today: Echo  11/17/2019 Echo 1. Left ventricular ejection fraction, by estimation, is 20 to 25%. Left  ventricular ejection fraction by 3D volume is 23 %. The left ventricle has  severely decreased function. The left ventricle demonstrates global    hypokinesis. Left ventricular  diastolic parameters are consistent with Grade II diastolic dysfunction  (pseudonormalization). Elevated left atrial pressure.  2. Right ventricular systolic function is moderately reduced. The right  ventricular size is moderately enlarged. There is moderately elevated  pulmonary artery systolic pressure. The estimated right ventricular  systolic pressure is 70.4 mmHg.  3. Left atrial size was severely dilated.  4. Right atrial size was severely dilated.  5. The mitral valve is degenerative. Moderate to severe mitral valve  regurgitation. No evidence of mitral stenosis.  6. Tricuspid valve regurgitation is severe.  7. Mild to moderate aortic stenosis. V max 2.2 m/s, mean gradient 10.5  mmHG. Gradients lower due to lower stroke volume (SV=38 cc; SVi=24 cc/m2).  The aortic valve is tricuspid. Aortic valve regurgitation is not  visualized.  8. There is mild dilatation of the ascending aorta measuring 41 mm.  9. The inferior vena cava is dilated in size with <50% respiratory  variability, suggesting right atrial pressure of 15 mmHg.   01/20/2020 ECG Sinus rhythm. QRSd 1103ms.   EKG:  The ekg ordered today demonstrates sinus rhythm with v rate 95.  Recent Labs: 02/24/2020: Magnesium 1.7 05/31/2020: ALT 12; BUN 33; Creatinine, Ser 0.93; Hemoglobin 15.4; Platelets 260.0; Potassium 3.7; Sodium 133; TSH 1.69  Recent Lipid Panel Labs (Brief)          Component Value Date/Time   CHOL 140 05/31/2020 1240   TRIG 174.0 (H) 05/31/2020 1240   TRIG 182 (H) 10/10/2006 1254   HDL 38.00 (L) 05/31/2020 1240   CHOLHDL 4 05/31/2020 1240   VLDL 34.8 05/31/2020 1240   LDLCALC 67 05/31/2020 1240   LDLDIRECT 76.0 04/11/2017 1408      Physical Exam:    VS:  LMP 09/02/2003        Wt Readings from Last 3 Encounters:  05/26/20 126 lb 6.4 oz (57.3 kg)  05/16/20 126 lb 2 oz (57.2 kg)  05/03/20 127 lb 12.8 oz (58 kg)     GEN: Frail  appearing in no acute distress HEENT: Normal NECK: No JVD; No carotid bruits LYMPHATICS: No lymphadenopathy CARDIAC: RRR, no murmurs, rubs, gallops RESPIRATORY:  Clear to auscultation without rales, wheezing or rhonchi  ABDOMEN: Soft, non-tender, non-distended MUSCULOSKELETAL:  No edema; No deformity  SKIN: Warm and dry NEUROLOGIC:  Alert and oriented x 3 PSYCHIATRIC:  Normal affect   ASSESSMENT:    1. Chronic systolic congestive heart failure ([place])   2. Coronary artery disease of bypass graft of native heart with stable angina pectoris ([place])    PLAN:    In order of problems listed above:  1. Chronic systolic heart failure NYHA class III symptoms on stable medical therapy.  Recommend primary prevention ICD.  No history of atrial arrhythmias and normal QRS duration.  Plan for [place] ICD.  Risks, benefits, alternatives were discussed with the patient in detail and she wishes to proceed. Continue spironolactone,vericiguat, Imdur, torsemide, metolazone, digoxin.  2. CAD s/p CABG Continue medical management.  Seems optimized during today's visit.  On aspirin and Plavix.  -------------------------------------------------------------------------------------------- I have seen, examined the patient. Plan for VVI ICD.  Vickie Epley, MD 07/15/2020 12:28 PM

## 2020-07-15 NOTE — Progress Notes (Signed)
Dr Lambert notified of CXR results and ok to d/c home 

## 2020-07-16 ENCOUNTER — Other Ambulatory Visit: Payer: Self-pay | Admitting: Pulmonary Disease

## 2020-07-16 ENCOUNTER — Telehealth: Payer: Self-pay | Admitting: Medical

## 2020-07-16 DIAGNOSIS — J9611 Chronic respiratory failure with hypoxia: Secondary | ICD-10-CM

## 2020-07-16 NOTE — Telephone Encounter (Signed)
   Patient's friend Ivin Booty called the after hours line with concerns for pain at the site of her ICD placement. Ms. Denson is on the line as well and gives permission to speak with Ivin Booty. She underwent ICD placement yesterday and initially felt fine, however upon waking this morning noticed soreness at her ICD site and pain in the left arm. She took some of her home prn norco which helped with the pain. No complaints of chest pain or SOB. I informed them this is likely a normal reaction to having an ICD placed. Encouraged them to notify the office if they noticed redness, irritation, or puss drainage at her ICD site or if she developed a fever. Continue supportive care with topical ice/hot packs for comfort and prn norco or OTC tylenol.   Abigail Butts, PA-C 07/16/20; 2:46 PM

## 2020-07-18 ENCOUNTER — Encounter (HOSPITAL_COMMUNITY): Payer: Self-pay | Admitting: Cardiology

## 2020-07-19 ENCOUNTER — Other Ambulatory Visit: Payer: Self-pay | Admitting: Cardiology

## 2020-07-26 ENCOUNTER — Ambulatory Visit (INDEPENDENT_AMBULATORY_CARE_PROVIDER_SITE_OTHER): Payer: Medicare Other | Admitting: Emergency Medicine

## 2020-07-26 ENCOUNTER — Other Ambulatory Visit: Payer: Self-pay

## 2020-07-26 DIAGNOSIS — I255 Ischemic cardiomyopathy: Secondary | ICD-10-CM | POA: Diagnosis not present

## 2020-07-26 LAB — CUP PACEART INCLINIC DEVICE CHECK
Date Time Interrogation Session: 20211026142140
HighPow Impedance: 82 Ohm
Implantable Lead Implant Date: 20211015
Implantable Lead Location: 753860
Implantable Lead Model: 672
Implantable Lead Serial Number: 166771
Implantable Pulse Generator Implant Date: 20211015
Lead Channel Impedance Value: 797 Ohm
Lead Channel Pacing Threshold Amplitude: 0.7 V
Lead Channel Pacing Threshold Pulse Width: 0.4 ms
Lead Channel Sensing Intrinsic Amplitude: 20.3 mV
Lead Channel Setting Pacing Amplitude: 3.5 V
Lead Channel Setting Pacing Pulse Width: 0.4 ms
Lead Channel Setting Sensing Sensitivity: 0.5 mV
Pulse Gen Serial Number: 279742

## 2020-07-26 NOTE — Progress Notes (Signed)
Wound check appointment. Steri-strips removed. Wound without redness or edema. Incision edges approximated, wound well healed. Normal device function. Thresholds, sensing, and impedances consistent with implant measurements. Device programmed at 3.5V for extra safety margin until 3 month visit. Histogram distribution appropriate for patient and level of activity. No ventricular arrhythmias noted. Patient educated about wound care, arm mobility, lifting restrictions, shock plan. ROV in 3 months with implanting physician. ?

## 2020-07-27 ENCOUNTER — Encounter (HOSPITAL_COMMUNITY): Payer: Medicare Other | Admitting: Cardiology

## 2020-07-27 DIAGNOSIS — I5020 Unspecified systolic (congestive) heart failure: Secondary | ICD-10-CM | POA: Diagnosis not present

## 2020-07-27 DIAGNOSIS — R0602 Shortness of breath: Secondary | ICD-10-CM | POA: Diagnosis not present

## 2020-07-27 DIAGNOSIS — I739 Peripheral vascular disease, unspecified: Secondary | ICD-10-CM | POA: Diagnosis not present

## 2020-07-27 DIAGNOSIS — I25708 Atherosclerosis of coronary artery bypass graft(s), unspecified, with other forms of angina pectoris: Secondary | ICD-10-CM | POA: Diagnosis not present

## 2020-08-02 ENCOUNTER — Other Ambulatory Visit: Payer: Self-pay | Admitting: Cardiology

## 2020-08-02 ENCOUNTER — Other Ambulatory Visit: Payer: Self-pay | Admitting: Pulmonary Disease

## 2020-08-09 DIAGNOSIS — R0602 Shortness of breath: Secondary | ICD-10-CM | POA: Diagnosis not present

## 2020-08-09 DIAGNOSIS — I25118 Atherosclerotic heart disease of native coronary artery with other forms of angina pectoris: Secondary | ICD-10-CM | POA: Diagnosis not present

## 2020-08-09 DIAGNOSIS — I2510 Atherosclerotic heart disease of native coronary artery without angina pectoris: Secondary | ICD-10-CM | POA: Diagnosis not present

## 2020-08-09 DIAGNOSIS — I340 Nonrheumatic mitral (valve) insufficiency: Secondary | ICD-10-CM | POA: Diagnosis not present

## 2020-08-09 DIAGNOSIS — I255 Ischemic cardiomyopathy: Secondary | ICD-10-CM | POA: Diagnosis not present

## 2020-08-09 DIAGNOSIS — I5022 Chronic systolic (congestive) heart failure: Secondary | ICD-10-CM | POA: Diagnosis not present

## 2020-08-09 DIAGNOSIS — I5020 Unspecified systolic (congestive) heart failure: Secondary | ICD-10-CM | POA: Diagnosis not present

## 2020-08-09 DIAGNOSIS — Z8673 Personal history of transient ischemic attack (TIA), and cerebral infarction without residual deficits: Secondary | ICD-10-CM | POA: Diagnosis not present

## 2020-08-09 DIAGNOSIS — Z87891 Personal history of nicotine dependence: Secondary | ICD-10-CM | POA: Diagnosis not present

## 2020-08-09 DIAGNOSIS — Z01818 Encounter for other preprocedural examination: Secondary | ICD-10-CM | POA: Diagnosis not present

## 2020-08-09 DIAGNOSIS — I429 Cardiomyopathy, unspecified: Secondary | ICD-10-CM | POA: Diagnosis not present

## 2020-08-09 DIAGNOSIS — I6523 Occlusion and stenosis of bilateral carotid arteries: Secondary | ICD-10-CM | POA: Diagnosis not present

## 2020-08-09 DIAGNOSIS — Z951 Presence of aortocoronary bypass graft: Secondary | ICD-10-CM | POA: Diagnosis not present

## 2020-08-09 DIAGNOSIS — I25708 Atherosclerosis of coronary artery bypass graft(s), unspecified, with other forms of angina pectoris: Secondary | ICD-10-CM | POA: Diagnosis not present

## 2020-08-12 MED FILL — VERQUVO 2.5 MG TABS: 2.5 | 30 days supply | Qty: 30 | Fill #2

## 2020-08-19 ENCOUNTER — Ambulatory Visit (HOSPITAL_COMMUNITY)
Admission: RE | Admit: 2020-08-19 | Discharge: 2020-08-19 | Disposition: A | Discharge status: Home / Self Care | Payer: Medicare Other | Source: Ambulatory Visit | Attending: Cardiology | Admitting: Cardiology

## 2020-08-19 ENCOUNTER — Telehealth (HOSPITAL_COMMUNITY): Payer: Self-pay

## 2020-08-19 ENCOUNTER — Other Ambulatory Visit: Payer: Self-pay

## 2020-08-19 ENCOUNTER — Encounter (HOSPITAL_COMMUNITY): Payer: Self-pay | Admitting: Cardiology

## 2020-08-19 VITALS — BP 112/70 | HR 80 | Wt 124.8 lb

## 2020-08-19 DIAGNOSIS — R252 Cramp and spasm: Secondary | ICD-10-CM | POA: Diagnosis not present

## 2020-08-19 DIAGNOSIS — E785 Hyperlipidemia, unspecified: Secondary | ICD-10-CM

## 2020-08-19 DIAGNOSIS — Z955 Presence of coronary angioplasty implant and graft: Secondary | ICD-10-CM | POA: Insufficient documentation

## 2020-08-19 DIAGNOSIS — Z7982 Long term (current) use of aspirin: Secondary | ICD-10-CM | POA: Diagnosis not present

## 2020-08-19 DIAGNOSIS — E1169 Type 2 diabetes mellitus with other specified complication: Secondary | ICD-10-CM

## 2020-08-19 DIAGNOSIS — Z951 Presence of aortocoronary bypass graft: Secondary | ICD-10-CM | POA: Diagnosis not present

## 2020-08-19 DIAGNOSIS — R0602 Shortness of breath: Secondary | ICD-10-CM | POA: Insufficient documentation

## 2020-08-19 DIAGNOSIS — I25708 Atherosclerosis of coronary artery bypass graft(s), unspecified, with other forms of angina pectoris: Secondary | ICD-10-CM | POA: Diagnosis not present

## 2020-08-19 DIAGNOSIS — Z7902 Long term (current) use of antithrombotics/antiplatelets: Secondary | ICD-10-CM | POA: Insufficient documentation

## 2020-08-19 DIAGNOSIS — Z79899 Other long term (current) drug therapy: Secondary | ICD-10-CM | POA: Insufficient documentation

## 2020-08-19 DIAGNOSIS — Z8673 Personal history of transient ischemic attack (TIA), and cerebral infarction without residual deficits: Secondary | ICD-10-CM | POA: Diagnosis not present

## 2020-08-19 DIAGNOSIS — I739 Peripheral vascular disease, unspecified: Secondary | ICD-10-CM

## 2020-08-19 DIAGNOSIS — R0789 Other chest pain: Secondary | ICD-10-CM | POA: Diagnosis not present

## 2020-08-19 DIAGNOSIS — F17210 Nicotine dependence, cigarettes, uncomplicated: Secondary | ICD-10-CM | POA: Insufficient documentation

## 2020-08-19 DIAGNOSIS — I2510 Atherosclerotic heart disease of native coronary artery without angina pectoris: Secondary | ICD-10-CM | POA: Diagnosis not present

## 2020-08-19 DIAGNOSIS — I5022 Chronic systolic (congestive) heart failure: Secondary | ICD-10-CM

## 2020-08-19 DIAGNOSIS — I110 Hypertensive heart disease with heart failure: Secondary | ICD-10-CM | POA: Insufficient documentation

## 2020-08-19 DIAGNOSIS — Z8249 Family history of ischemic heart disease and other diseases of the circulatory system: Secondary | ICD-10-CM | POA: Insufficient documentation

## 2020-08-19 DIAGNOSIS — I255 Ischemic cardiomyopathy: Secondary | ICD-10-CM | POA: Diagnosis not present

## 2020-08-19 DIAGNOSIS — Z794 Long term (current) use of insulin: Secondary | ICD-10-CM | POA: Insufficient documentation

## 2020-08-19 DIAGNOSIS — I951 Orthostatic hypotension: Secondary | ICD-10-CM | POA: Diagnosis not present

## 2020-08-19 LAB — BASIC METABOLIC PANEL
Anion gap: 11 (ref 5–15)
BUN: 20 mg/dL (ref 8–23)
CO2: 31 mmol/L (ref 22–32)
Calcium: 9.3 mg/dL (ref 8.9–10.3)
Chloride: 90 mmol/L — ABNORMAL LOW (ref 98–111)
Creatinine, Ser: 0.86 mg/dL (ref 0.44–1.00)
GFR, Estimated: >60 mL/min (ref >60)
Glucose, Bld: 252 mg/dL — ABNORMAL HIGH (ref 70–99)
Potassium: 4 mmol/L (ref 3.5–5.1)
Sodium: 132 mmol/L — ABNORMAL LOW (ref 135–145)

## 2020-08-19 LAB — LIPID PANEL
Cholesterol: 103 mg/dL (ref 0–200)
HDL: 27 mg/dL — ABNORMAL LOW (ref >40)
LDL Cholesterol: 45 mg/dL (ref 0–99)
Total CHOL/HDL Ratio: 3.8 RATIO
Triglycerides: 155 mg/dL — ABNORMAL HIGH (ref <150)
VLDL: 31 mg/dL (ref 0–40)

## 2020-08-19 LAB — DIGOXIN LEVEL: Digoxin Level: 1.2 ng/mL (ref 1.0–2.0)

## 2020-08-19 MED ORDER — VERQUVO 5 MG PO TABS: 5.0000 mg | ORAL_TABLET | Freq: Every day | ORAL | 6 refills | Status: DC

## 2020-08-19 MED FILL — VERQUVO 5 MG TABS: 5 | 30 days supply | Qty: 30 | Fill #0

## 2020-08-19 NOTE — Patient Instructions (Signed)
Increase Verquvo to 5 mg Daily  Stop Imdur  Labs done today, your results will be available in MyChart, we will contact you for abnormal readings.  Your physician recommends that you schedule a follow-up appointment in: 2 months  If you have any questions or concerns before your next appointment please send [place] a message through [place] or call our office at [phone].    TO LEAVE A MESSAGE FOR THE NURSE SELECT OPTION 2, PLEASE LEAVE A MESSAGE INCLUDING: . YOUR NAME . DATE OF BIRTH . CALL BACK NUMBER . REASON FOR CALL**this is important as we prioritize the call backs  [place] AS LONG AS YOU CALL BEFORE 4:00 PM  At the [facility], you and your health needs are our priority. As part of our continuing mission to provide you with exceptional heart care, we have created designated Provider Care Teams. These Care Teams include your primary Cardiologist (physician) and Advanced Practice Providers (APPs- Physician Assistants and Nurse Practitioners) who all work together to provide you with the care you need, when you need it.   You may see any of the following providers on your designated Care Team at your next follow up: [name] Dr Glori Bickers . Dr Loralie Champagne . Darrick Grinder, NP . Lyda Jester, PA . Audry Riles, PharmD   Please be sure to bring in all your medications bottles to every appointment.

## 2020-08-19 NOTE — Telephone Encounter (Signed)
-----   Message from Larey Dresser, MD sent at 08/19/2020  3:49 PM EST ----- Digoxin high but not trough.  Please have her get repeat digoxin level as trough (before morning digoxin dose).

## 2020-08-19 NOTE — Telephone Encounter (Signed)
Patient advised and verbalized understanding. Lab appt scheduled, lab order entered.   

## 2020-08-21 NOTE — Progress Notes (Signed)
PCP: [name], MD Cardiology: Dr. Martinique HF Cardiology: Dr. Aundra Dubin  64 y.o. with history of CAD, ischemic cardiomyopathy, CVA, and valvular disease was referred by Dr. Martinique for CHF evaluation.  Patient has an extensive history of vascular disease.  She had CABG in 2007.  Repeat LHC in 2007 showed all grafts closed except LIMA-LAD.  Patient had DES to LCx/OM.  [employer] 2016 showed occlusion of DES to LCx/OM with L-L and L-R collaterals. Poor targets for redo CABG.   In 2/21, echo was done showing EF 20-25% with moderate RV dysfunction, moderate-severe MR, moderate TR.  Despite smoking history, PFTs in 2019 looked relatively normal. She additionally had the placement of a carotid stent on the right in 1/19.    RHC was done in 4/21, showing near-normal filling pressures with CI 2.12 by Fick.  CPX in 5/21 showed peak VO2 13, VE/VCO2 slope 37 => moderate HF limitation.  The PFTs on the CPX were not markedly abnormal. She saw Dr. Gwenlyn Found recently, peripheral arterial dopplers in 4/21 showed significant stenosis right SFA.   We worked her up for LVAD.  She was presented at [facility] and saw Dr. Prescott Gum.  He was concerned by her small stature and small LV cavity. Concern that Heartmate 3 would not function well in her small LV, and Heartware LVAD that was thought to be more suitable for small patients has been discontinued.   We considered her for a barostimulation trial, but she does not qualify for either ANTHEM or Batwire due to carotid disease.   She is off dapagliflozin due to recurrrent GU yeast infections.  In 7/21, she had atherectomy of right SFA.  [place] placed in 10/21.   As she was turned down for LVAD at [facility], I had her seen at [facility]. She underwent workup there with echo in 11/21 showing higher EF 35%. RHC, however, showed low CI 1.8.    Patient returns for followup of CHF.  She is still smoking about 3/4 ppd, has set quit date for December.  She still has right calf and thigh  cramps after walking about 100 feet.  This is her main complaint currently.  Occasional lightheadedness but not severe.  No syncope.  Weight down 2 lbs.  Rare chest pain now, occasionally gets pain in bed at night but not exertional.  She gets short of breath walking more than 100-200 feet.  No orthopnea/PND.    HeartLogic score 0                Labs (4/21): K 3.7 => 3.4, creatinine 0.78 => 0.7, hgb 11.3 => 14.4 Labs (5/21): digoxin 0.6, K 3.5, creatinine 0.63 Labs (6/21): K 4.1, creatinine 0.7, LDL 61, TGs 247 Labs (7/21): K 5, creatinine 0.76, hgb 16.9.  Labs (10/21): K 4.6, creatinine 0.86  PMH: 1. CAD: CABG 2007.  - LHC in 2007 showed all grafts closed except LIMA-LAD.  Patient had DES to LCx/OM.   - [facility] 2016 showed occlusion of DES to LCx/OM with L-L and L-R collaterals.  - Not candidate for redo CABG with poor targets.  2. Chronic systolic CHF: Ischemic cardiomyopathy.   - Echo (2/21): EF 20-25%, moderately dilated and moderately dysfunctional RV, PASP 49, severe biatrial enlargement, moderate-severe MR, severe TR, mild-moderate AS, IVC dilated.  - RHC (4/21): mean RA 6, PA 41/16, mean 29, mean PCWP 15, CI 2.12, PVR 4.3 WU - CPX (5/21): peak VO2 13, VE/VCO2 slope 37, RER 1.06 => moderate HF  limitation.  - Echo (11/21): EF 35%, mild LVH.  - RHC (11/21): baseline CI 1.8, exercise peak VO2 12.2, PCWP up to 24 with exercise.  3. Fe deficiency anemia: FOBT negative.  4. Carotid stenosis: Right carotid stent in 1/19.  - Carotid dopplers (1/32): 44-01% LICA stenosis.  - Carotid dopplers (0/27): 25-36% LICA stenosis.  5. H/o CVA 6. HTN 7. Type 2 diabetes 8. Hyperlipidemia 9. Seizure disorder 10. OSA: Cannot tolerate CPAP.  11. Active smoker: PFTs in 2019 were relatively normal.  She does use oxygen at night.  - PFTs (5/21): FEV1 82%, FVC 80%, ratio 102% => minimal obstruction.  12. PAD: ABIs (4/21) 0.76 on right, 0.86 on left => significant right SFA stenosis.  -  Atherectomy/angioplasty right SFA.  13. Chronic penetrating atherosclerotic ulcer descending thoracic aorta.   Social History   Socioeconomic History  . Marital status: Divorced    Spouse name: Not on file  . Number of children: 0  . Years of education: Not on file  . Highest education level: Not on file  Occupational History  . Occupation: disabled  Tobacco Use  . Smoking status: Current Every Day Smoker    Packs/day: 1.00    Years: 51.00    Pack years: 51.00    Types: Cigarettes    Start date: 25    Last attempt to quit: 02/22/2020    Years since quitting: 0.4  . Smokeless tobacco: Never Used  Vaping Use  . Vaping Use: Former  Substance and Sexual Activity  . Alcohol use: No  . Drug use: No  . Sexual activity: Not Currently    Partners: Male    Birth control/protection: None  Other Topics Concern  . Not on file  Social History Narrative   Right handed   Lives alone in a one story home   Social Determinants of Health   Financial Resource Strain:   . Difficulty of Paying Living Expenses: Not on file  Food Insecurity:   . Worried About Charity fundraiser in the Last Year: Not on file  . Ran Out of Food in the Last Year: Not on file  Transportation Needs:   . Lack of Transportation (Medical): Not on file  . Lack of Transportation (Non-Medical): Not on file  Physical Activity:   . Days of Exercise per Week: Not on file  . Minutes of Exercise per Session: Not on file  Stress:   . Feeling of Stress : Not on file  Social Connections:   . Frequency of Communication with Friends and Family: Not on file  . Frequency of Social Gatherings with Friends and Family: Not on file  . Attends Religious Services: Not on file  . Active Member of Clubs or Organizations: Not on file  . Attends Archivist Meetings: Not on file  . Marital Status: Not on file  Intimate Partner Violence:   . Fear of Current or Ex-Partner: Not on file  . Emotionally Abused: Not on file   . Physically Abused: Not on file  . Sexually Abused: Not on file   Family History  Problem Relation Age of Onset  . Heart disease Mother   . Diabetes Mother   . COPD Mother   . Hyperlipidemia Mother   . Hypertension Mother   . Cancer Father        met, origin unknown  . Heart attack Father   . Drug abuse Paternal Grandmother   . Stroke Paternal Grandfather   . Lung  cancer Paternal Aunt        lung with mets to brain  . Melanoma Paternal Uncle   . Lung cancer Paternal Uncle        lung/liver to brain  . Cancer Paternal Uncle        cancer of unknown type  . Heart disease Maternal Grandfather   . Hypertension Sister   . Cancer Sister        eyelid  . Glaucoma Sister   . Heart disease Maternal Aunt        x 2 aunts   ROS: All systems reviewed and negative except as per HPI.   Current Outpatient Medications  Medication Sig Dispense Refill  . acetaminophen (TYLENOL) 500 MG tablet Take 1,000 mg by mouth every 6 (six) hours as needed for moderate pain or headache.    . albuterol (VENTOLIN HFA) 108 (90 Base) MCG/ACT inhaler INHALE 2 PUFFS EVERY 6 HOURS AS NEEDED FOR WHEEZING/SHORTNESS OF BREATH 18 each 1  . aspirin EC 81 MG tablet Take 81 mg by mouth every evening.  30 tablet 6  . clopidogrel (PLAVIX) 75 MG tablet TAKE 1 TABLET DAILY 90 tablet 3  . DEXILANT 60 MG capsule TAKE 1 CAPSULE DAILY 90 capsule 1  . digoxin (LANOXIN) 0.125 MG tablet TAKE 0.5 TABLETS (0.0625 MG TOTAL) BY MOUTH DAILY. 45 tablet 3  . diphenhydrAMINE (BENADRYL) 25 mg capsule Take 25 mg by mouth 2 (two) times daily as needed for itching or allergies.     [name] ezetimibe (ZETIA) 10 MG tablet Take 1 tablet (10 mg total) by mouth daily. 30 tablet 6  . fluticasone (FLONASE) 50 MCG/ACT nasal spray Place 2 sprays into both nostrils daily. 48 g 1  . gabapentin (NEURONTIN) 300 MG capsule TAKE 2 CAPSULES BY MOUTH EVERY DAY AT BEDTIME 180 capsule 1  . glimepiride (AMARYL) 4 MG tablet Take 4 mg by mouth in the morning and  at bedtime.    . Glycopyrrolate-Formoterol (BEVESPI AEROSPHERE) 9-4.8 MCG/ACT AERO Inhale 2 puffs into the lungs 2 (two) times daily. 32.1 g 1  . guaiFENesin (MUCINEX) 600 MG 12 hr tablet Take 2 tablets (1,200 mg total) by mouth 2 (two) times daily. 360 tablet 3  . HYDROcodone-acetaminophen (NORCO) 5-325 MG tablet Take 1 tablet by mouth daily as needed for severe pain. 15 tablet 0  . iron polysaccharides (NIFEREX) 150 MG capsule Take 150 mg by mouth. 3 times per week    . levETIRAcetam (KEPPRA) 1000 MG tablet Take 1 tablet (1,000 mg total) by mouth 2 (two) times daily. 180 tablet 3  . metFORMIN (GLUCOPHAGE-XR) 500 MG 24 hr tablet Take 500 mg by mouth every evening.   6  . metolazone (ZAROXOLYN) 2.5 MG tablet Take 1 tablet (2.5 mg total) by mouth 2 (two) times a week. Every Mon and Fri 15 tablet 4  . nicotine (NICODERM CQ - DOSED IN MG/24 HOURS) 21 mg/24hr patch Place 21 mg onto the skin daily.    . nicotine polacrilex (NICORETTE) 4 MG gum Take 1 each (4 mg total) by mouth as needed for smoking cessation. 90 tablet 2  . nitroGLYCERIN (NITROLINGUAL) 0.4 MG/SPRAY spray Place 1 spray under the tongue every 5 (five) minutes x 3 doses as needed for chest pain. 12 g 3  . NYSTATIN powder APPLY 1 APPLICATION        TOPICALLY 4    TIMES A     DAY AS NEEDED    FOR YEAST    INFECTIONS  45 g 1  . ONE TOUCH ULTRA TEST test strip USE AS DIRECTED FOR TESTING BLOOD GLUCOSE 3 TIMES DAILY  1  . polyethylene glycol (MIRALAX / GLYCOLAX) 17 g packet Take 17 g by mouth daily.    . potassium chloride SA (KLOR-CON) 20 MEQ tablet Take 1 tablet (20 mEq total) by mouth 2 (two) times daily.    . promethazine (PHENERGAN) 25 MG tablet Take 1 tablet (25 mg total) by mouth every 8 (eight) hours as needed for nausea or vomiting. Caution of sedation 30 tablet 1  . ranolazine (RANEXA) 1000 MG SR tablet TAKE 1 TABLET TWICE A DAY 180 tablet 3  . rosuvastatin (CRESTOR) 20 MG tablet Take 1 tablet (20 mg total) by mouth daily. 30 tablet 6   . sodium chloride (OCEAN) 0.65 % SOLN nasal spray Place 1 spray into both nostrils as needed for congestion.    [name] spironolactone (ALDACTONE) 25 MG tablet Take 1 tablet (25 mg total) by mouth at bedtime. 90 tablet 3  . torsemide (DEMADEX) 20 MG tablet TAKE 3 TABLETS (60 MG TOTAL) BY MOUTH EVERY MORNING AND 2 TABLETS (40 MG TOTAL) EVERY EVENING. 450 tablet 3  . TRESIBA FLEXTOUCH 100 UNIT/ML FlexTouch Pen Inject 35 Units into the skin daily.     [name] VASCEPA 1 g capsule TAKE 2 CAPSULES (2 G TOTAL) BY MOUTH 2 (TWO) TIMES DAILY. 120 capsule 3  . Vericiguat (VERQUVO) 5 MG TABS Take 5 mg by mouth daily. 30 tablet 6   No current facility-administered medications for this encounter.   BP 112/70   Pulse 80   Wt 56.6 kg (124 lb 12.8 oz)   LMP 09/02/2003   SpO2 98%   BMI 26.08 kg/m  General: NAD Neck: No JVD, no thyromegaly or thyroid nodule.  Lungs: Clear to auscultation bilaterally with normal respiratory effort. CV: Nondisplaced PMI.  Heart regular S1/S2, no S3/S4, 2/6 SEM RUSB.  No peripheral edema.  No carotid bruit.  Unable to palpate pedal pulses.  Abdomen: Soft, nontender, no hepatosplenomegaly, no distention.  Skin: Intact without lesions or rashes.  Neurologic: Alert and oriented x 3.  Psych: Normal affect. Extremities: No clubbing or cyanosis.  HEENT: Normal.   Assessment/Plan:  1. Chronic systolic CHF: Ischemic cardiomyopathy, [employer] ICD.  Echo in 2/21 showed EF 20-25%, moderately dilated and moderately dysfunctional RV, PASP 49, severe biatrial enlargement, moderate-severe MR, severe TR, mild-moderate AS, IVC dilated. CPX (5/21) with moderate HF limitation, RHC (4/21) with CI low at 2.12.  Medication titration has been severely limited by hypotension and orthostatic symptoms.  Not a transplant candidate due to smoking. She was seen by cardiac surgery at [facility] and not thought to be good LVAD candidate due to small stature, small LV cavity, and redo sternotomy.  I sent her  for a 2nd opinion at [facility].  She had echo there in 11/21 with EF higher at 35% but CI low on RHC at 1.8.  NYHA class III symptoms, stable.  She is now volume overloaded on exam.  - Continue digoxin 0.0625 mg daily, check level today.   - Continue torsemide 60 qam/40 qpm.  Continue metolazone 2.5 mg bid, Monday/Friday.  BMET today.  - Continue spironolactone 25 mg daily.  - She has not tolerated BP-active meds well, avoid Coreg and Entresto for now.  She became hypotensive with losartan.  - She did not tolerate SGLT2 inhibitor due to recurrent GU yeast infections.  - Increase vericiguat to 5 mg daily.   -  As above, she does not qualify for the barostimulation trials due to carotid disease.  - With higher EF on last echo, not candidate for LVAD at this time.  2. CAD: s/p CABG 7955 complicated by early graft closure.  Repeat cath in 2007 showed only LIMA-LAD still open and she had DES to LCX/OM.  Cath in 2016 showed this stent was occluded.  She has not had targets for redo CABG and she does not have good interventional targets. She has occasional atypical chest pain. - Continue ASA 81, Plavix 75 mg daily.  - Continue ranolazone 1000 bid.  - Minimal atypical chest pain, will stop Imdur to see if this helps with her lightheadedness.   - Continue Crestor 20 mg daily and Zetia 10 mg daily.  Lipids/LFTs today.  3. Smoking: Still smoking 3/4 ppd.  Has set quit date for 12/21.    4. PAD: s/p atherectomy/angioplasty right SFA in 7/21.  Still with significant claudication.  - Has followup with Dr. Gwenlyn Found in February, declines sooner appt.   5. Carotid stenosis: Moderate stenosis, repeat dopplers 6/22.   6. Fe deficiency anemia: Has been FOBT negative.  Has had Ferahema.  Most recent hgb looks stable.   1 month followup.    Diane [name] 08/21/2020

## 2020-08-22 DIAGNOSIS — E1121 Type 2 diabetes mellitus with diabetic nephropathy: Secondary | ICD-10-CM | POA: Diagnosis not present

## 2020-08-22 DIAGNOSIS — G629 Polyneuropathy, unspecified: Secondary | ICD-10-CM | POA: Diagnosis not present

## 2020-08-22 DIAGNOSIS — E7800 Pure hypercholesterolemia, unspecified: Secondary | ICD-10-CM | POA: Diagnosis not present

## 2020-08-22 DIAGNOSIS — I509 Heart failure, unspecified: Secondary | ICD-10-CM | POA: Diagnosis not present

## 2020-08-22 DIAGNOSIS — E119 Type 2 diabetes mellitus without complications: Secondary | ICD-10-CM | POA: Diagnosis not present

## 2020-08-22 DIAGNOSIS — I10 Essential (primary) hypertension: Secondary | ICD-10-CM | POA: Diagnosis not present

## 2020-08-23 ENCOUNTER — Ambulatory Visit (HOSPITAL_COMMUNITY)
Admission: RE | Admit: 2020-08-23 | Discharge: 2020-08-23 | Disposition: A | Discharge status: Home / Self Care | Payer: Medicare Other | Source: Ambulatory Visit | Attending: Cardiology | Admitting: Cardiology

## 2020-08-23 ENCOUNTER — Other Ambulatory Visit: Payer: Self-pay

## 2020-08-23 DIAGNOSIS — I5022 Chronic systolic (congestive) heart failure: Secondary | ICD-10-CM | POA: Insufficient documentation

## 2020-08-23 LAB — DIGOXIN LEVEL: Digoxin Level: 0.9 ng/mL — ABNORMAL LOW (ref 1.0–2.0)

## 2020-08-30 ENCOUNTER — Encounter: Payer: Medicare Other | Admitting: Family Medicine

## 2020-09-02 ENCOUNTER — Other Ambulatory Visit: Payer: Self-pay | Admitting: Pulmonary Disease

## 2020-09-02 DIAGNOSIS — J9611 Chronic respiratory failure with hypoxia: Secondary | ICD-10-CM

## 2020-09-03 ENCOUNTER — Other Ambulatory Visit: Payer: Self-pay | Admitting: Family Medicine

## 2020-09-05 NOTE — Telephone Encounter (Signed)
Last filled on 07/05/20 #45 g with 1 refills, CPE was on 06/07/20, please advise

## 2020-09-07 ENCOUNTER — Other Ambulatory Visit (HOSPITAL_COMMUNITY): Payer: Self-pay | Admitting: Cardiology

## 2020-09-13 ENCOUNTER — Ambulatory Visit (INDEPENDENT_AMBULATORY_CARE_PROVIDER_SITE_OTHER): Payer: Medicare Other | Admitting: Family Medicine

## 2020-09-13 ENCOUNTER — Other Ambulatory Visit: Payer: Self-pay

## 2020-09-13 ENCOUNTER — Other Ambulatory Visit (HOSPITAL_COMMUNITY)
Admission: RE | Admit: 2020-09-13 | Discharge: 2020-09-13 | Disposition: A | Discharge status: Home / Self Care | Payer: Medicare Other | Source: Ambulatory Visit | Attending: Family Medicine | Admitting: Family Medicine

## 2020-09-13 ENCOUNTER — Encounter: Payer: Self-pay | Admitting: Family Medicine

## 2020-09-13 VITALS — BP 120/60 | HR 81 | Ht <= 58 in | Wt 123.0 lb

## 2020-09-13 DIAGNOSIS — R87810 Cervical high risk human papillomavirus (HPV) DNA test positive: Secondary | ICD-10-CM | POA: Diagnosis not present

## 2020-09-13 DIAGNOSIS — N870 Mild cervical dysplasia: Secondary | ICD-10-CM | POA: Diagnosis not present

## 2020-09-13 DIAGNOSIS — R87610 Atypical squamous cells of undetermined significance on cytologic smear of cervix (ASC-US): Secondary | ICD-10-CM | POA: Diagnosis not present

## 2020-09-13 DIAGNOSIS — Z1231 Encounter for screening mammogram for malignant neoplasm of breast: Secondary | ICD-10-CM

## 2020-09-13 NOTE — Addendum Note (Signed)
Addended by: Michel Harrow on: 09/13/2020 04:57 PM   Modules accepted: Orders

## 2020-09-13 NOTE — Progress Notes (Signed)
GYNECOLOGY OFFICE VISIT NOTE  History:   Diane [name] is a 64 y.o. G1P0 here today for colposcopy.  Patient with following pap hx: 04/23/2019 - NILM, HPV+ (neg 16/18) 06/07/2020 - ASCUS, HPV+ (neg 16/18)  Past Medical History:  Diagnosis Date  . Anemia   . Carotid stenosis    a. s/p right carotid stent 10/2017.  [name] Chronic systolic CHF (congestive heart failure) ([place])   . Colon polyps   . COPD (chronic obstructive pulmonary disease) ([place])   . Coronary artery disease    post CABG in 3/07 , coronary stents   . Diabetes mellitus    type 2  . Dyslipidemia   . Fibromyalgia   . GERD (gastroesophageal reflux disease)   . Headache    hx of   . Hyperlipidemia   . Hypertension   . Myocardial infarction ([place])   . Pneumonia    hx of   . Seizures ([place])    last seizure- 03/2013   . Shortness of breath dyspnea    with exertion or when fluid builds up   . Sleep apnea    used to wear a cpap- not used in 3 years   . Status post dilation of esophageal narrowing   . Stroke [facility]) 1993   problems with balance   . Systolic murmur    known mild AS and MR  . Thyroid goiter   . Tobacco abuse     Past Surgical History:  Procedure Laterality Date  . ABDOMINAL AORTOGRAM W/LOWER EXTREMITY N/A 04/18/2020   Procedure: ABDOMINAL AORTOGRAM W/LOWER EXTREMITY;  Surgeon: Lorretta Harp, MD;  Location: [place] [facility];  Service: Cardiovascular;  Laterality: N/A;  . APPENDECTOMY    . BIOPSY  02/03/2019   Procedure: BIOPSY;  Surgeon: Ladene Artist, MD;  Location: [facility];  Service: Endoscopy;;  . BREAST BIOPSY Left 2016  . CARDIAC CATHETERIZATION  11/29/05   EF of 55%  . CARDIAC CATHETERIZATION  08/06/06   EF of 45-50%  . CARDIAC CATHETERIZATION N/A 05/06/2015   Procedure: Right/Left Heart Cath and Coronary/Graft Angiography;  Surgeon: Peter M Martinique, MD;  Location: [place] [facility];  Service: Cardiovascular;  Laterality: N/A;  . CAROTID PTA/STENT INTERVENTION N/A 10/10/2017   Procedure:  CAROTID PTA/STENT INTERVENTION - Right;  Surgeon: Serafina Mitchell, MD;  Location: [place] [facility];  Service: Cardiovascular;  Laterality: N/A;  . CERVICAL FUSION  1990  . CHOLECYSTECTOMY    . COLON RESECTION     mass removed and 4 in of colon  . COLONOSCOPY WITH PROPOFOL N/A 07/16/2017   Procedure: COLONOSCOPY WITH PROPOFOL;  Surgeon: Ladene Artist, MD;  Location: [facility];  Service: Endoscopy;  Laterality: N/A;  . CORONARY ARTERY BYPASS GRAFT  12/04/2005   x5 -- left internal mammary artery to the LAD, left radial artery to the ramus intermedius, saphenous vein graft to the obtuse marginal 1, sequential saphenous vein grat to the acute marginal and posterior descending, endoscopic vein harvesting from the left thigh with open vein harvest from right leg  . CORONARY STENT PLACEMENT  08/11/06   PCI of her ciurcumflex/OM vessel  . ESOPHAGOGASTRODUODENOSCOPY (EGD) WITH PROPOFOL N/A 02/03/2019   Procedure: ESOPHAGOGASTRODUODENOSCOPY (EGD) WITH PROPOFOL;  Surgeon: Ladene Artist, MD;  Location: [facility];  Service: Endoscopy;  Laterality: N/A;  . ICD IMPLANT N/A 07/15/2020   Procedure: ICD IMPLANT;  Surgeon: Vickie Epley, MD;  Location: [place] [facility];  Service: Cardiovascular;  Laterality: N/A;  .  LAPAROTOMY Bilateral 05/19/2015   Procedure: EXPLORATORY LAPAROTOMY WITH BILATERAL SALPINGO OOPHORECTOMY /OMENTECTOMY/SEGMENTAL SIGMOID COLECTOMY ;  Surgeon: Everitt Amber, MD;  Location: [facility];  Service: Gynecology;  Laterality: Bilateral;  . PERIPHERAL VASCULAR BALLOON ANGIOPLASTY  04/18/2020   Procedure: PERIPHERAL VASCULAR BALLOON ANGIOPLASTY;  Surgeon: Lorretta Harp, MD;  Location: [place] [facility];  Service: Cardiovascular;;  rt SFA  atherectomy and DCB  . PERIPHERAL VASCULAR CATHETERIZATION N/A 11/15/2015   Procedure: Carotid PTA/Stent Intervention;  Surgeon: Lorretta Harp, MD;  Location: [place] [facility];  Service: Cardiovascular;  Laterality: N/A;  . PERIPHERAL  VASCULAR CATHETERIZATION  11/15/2015   Procedure: Carotid Angiography;  Surgeon: Lorretta Harp, MD;  Location: [place] [facility];  Service: Cardiovascular;;  . RIGHT HEART CATH N/A 01/27/2020   Procedure: RIGHT HEART CATH;  Surgeon: Larey Dresser, MD;  Location: [place] [facility];  Service: Cardiovascular;  Laterality: N/A;    The following portions of the patient's history were reviewed and updated as appropriate: allergies, current medications, past family history, past medical history, past social history, past surgical history and problem list.   Health Maintenance: see above.  Normal mammogram: 07/03/2018.   Review of Systems:  Pertinent items noted in HPI and remainder of comprehensive ROS otherwise negative.  Physical Exam:  BP 120/60   Pulse 81   Ht 4\' 10"  (1.473 m)   Wt 123 lb (55.8 kg)   LMP 09/02/2003   BMI 25.71 kg/m  CONSTITUTIONAL: Well-developed, well-nourished female in no acute distress.  HEENT:  Normocephalic, atraumatic. External right and left ear normal. No scleral icterus.  NECK: Normal range of motion, supple, no masses noted on observation SKIN: No rash noted. Not diaphoretic. No erythema. No pallor. MUSCULOSKELETAL: Normal range of motion. No edema noted. NEUROLOGIC: Alert and oriented to person, place, and time. Normal muscle tone coordination.  PSYCHIATRIC: Normal mood and affect. Normal behavior. Normal judgment and thought content. RESPIRATORY: Effort normal, no problems with respiration noted ABDOMEN:  PELVIC: Normal external genitalia. Atrophic vaginal tissue. Unremarkable cervix  Labs and Imaging No results found. However, due to the size of the patient record, not all encounters were searched. Please check Results Review for a complete set of results. No results found.    Assessment and Plan:   Problem List Items Addressed This Visit      Other   ASCUS with positive high risk HPV cervical    Colposcopy indicated. Consent obtained and time  out performed, Acetowhite changes at 7 o clock otherwise unremarkable, ECC also obtained. Will call with results.   Also due for mammogram, ordered.        Other Visit Diagnoses    Atypical squamous cells of undetermined significance on cytologic smear of cervix (ASC-US)    -  Primary   Encounter for screening mammogram for malignant neoplasm of breast       Relevant Orders   MM Digital Screening      Routine preventative health maintenance measures emphasized. Please refer to After Visit Summary for other counseling recommendations.   Return if symptoms worsen or fail to improve.    Total face-to-face time with patient: 20 minutes.  Over 50% of encounter was spent on counseling and coordination of care.   Clarnce Flock, MD/[facility] [employer], [place]

## 2020-09-13 NOTE — Assessment & Plan Note (Addendum)
Colposcopy indicated. Consent obtained and time out performed, Acetowhite changes at 7 o clock otherwise unremarkable, ECC also obtained. Will call with results.   Also due for mammogram, ordered.

## 2020-09-13 NOTE — Progress Notes (Signed)
Mammogram scheduled for Tuesday, January 25th 1330.  Pt notified.  Mel Almond, RN  09/13/20

## 2020-09-13 NOTE — Progress Notes (Signed)
    GYNECOLOGY CLINIC COLPOSCOPY PROCEDURE NOTE  64 y.o. G1P0 here for colposcopy for ASCUS with POSITIVE high risk HPV pap smear. Discussed role for HPV in cervical dysplasia, need for surveillance.  Patient given informed consent, signed copy in the chart, time out was performed.  Placed in lithotomy position. Cervix viewed with speculum and colposcope after application of acetic acid.   Colposcopy adequate? Yes  acetowhite lesion(s) noted at 7 o'clock; corresponding biopsies obtained.  ECC specimen obtained. All specimens were labeled and sent to pathology.  Patient was given post procedure instructions.  Will follow up pathology and manage accordingly; patient will be contacted with results and recommendations.  Routine preventative health maintenance measures emphasized.  Augustin Coupe, MD/[facility] OB Fellow

## 2020-09-15 LAB — SURGICAL PATHOLOGY

## 2020-09-16 ENCOUNTER — Telehealth: Payer: Self-pay | Admitting: Family Medicine

## 2020-09-16 NOTE — Telephone Encounter (Signed)
Called patient and left VM that I was calling back about her colposcopy biopsy results and that it was good news though didn't leave specifics. Will send her a mychart message as well but instructed her to call back on Monday if she wanted to speak to someone at the clinic. If patient has not yet seen message on Monday please call, otherwise can close encounter.

## 2020-09-19 MED FILL — VERQUVO 5 MG TABS: 5 | 30 days supply | Qty: 30 | Fill #1

## 2020-09-19 NOTE — Telephone Encounter (Signed)
Called pt and left message stating that Dr. Dione Plover has reviewed her test results and has sent a Mychart message. If she is not able to view the message she may call the office and leave a message on nurse voicemail so that we can call her back to inform her of the results.

## 2020-09-21 ENCOUNTER — Other Ambulatory Visit (HOSPITAL_COMMUNITY): Payer: Self-pay | Admitting: Cardiology

## 2020-09-21 ENCOUNTER — Telehealth: Payer: Self-pay | Admitting: *Deleted

## 2020-09-21 NOTE — Telephone Encounter (Signed)
Pt left VM message stating that she has not been in her Mychart for years and requests a call back regarding test results. I called pt and left message that I am returning her call. If she would like [place] to leave detailed information on her voicemail, please call back and let us know. She can also call the Mychart help desk for assistance in resetting her username or password. [phone].

## 2020-09-22 NOTE — Telephone Encounter (Signed)
Returned patients call. Patient informed of Colposcopy results and was informed she needs repeat pap smear in September 2022. Patient informed to call the office in July to schedule next appt. Patient voiced understanding.   She reports she was informed to call My Chart to reset her password previously.

## 2020-09-26 DIAGNOSIS — E119 Type 2 diabetes mellitus without complications: Secondary | ICD-10-CM | POA: Diagnosis not present

## 2020-09-26 DIAGNOSIS — I509 Heart failure, unspecified: Secondary | ICD-10-CM | POA: Diagnosis not present

## 2020-09-26 DIAGNOSIS — E1121 Type 2 diabetes mellitus with diabetic nephropathy: Secondary | ICD-10-CM | POA: Diagnosis not present

## 2020-09-26 DIAGNOSIS — G629 Polyneuropathy, unspecified: Secondary | ICD-10-CM | POA: Diagnosis not present

## 2020-09-26 DIAGNOSIS — I10 Essential (primary) hypertension: Secondary | ICD-10-CM | POA: Diagnosis not present

## 2020-09-26 DIAGNOSIS — E7800 Pure hypercholesterolemia, unspecified: Secondary | ICD-10-CM | POA: Diagnosis not present

## 2020-10-14 ENCOUNTER — Other Ambulatory Visit: Payer: Self-pay | Admitting: Pulmonary Disease

## 2020-10-14 DIAGNOSIS — J9611 Chronic respiratory failure with hypoxia: Secondary | ICD-10-CM

## 2020-10-19 ENCOUNTER — Other Ambulatory Visit (HOSPITAL_COMMUNITY): Payer: Self-pay | Admitting: Cardiology

## 2020-10-19 DIAGNOSIS — E782 Mixed hyperlipidemia: Secondary | ICD-10-CM

## 2020-10-25 ENCOUNTER — Ambulatory Visit
Admission: RE | Admit: 2020-10-25 | Discharge: 2020-10-25 | Disposition: A | Discharge status: Home / Self Care | Payer: Medicare Other | Source: Ambulatory Visit | Attending: Family Medicine | Admitting: Family Medicine

## 2020-10-25 ENCOUNTER — Ambulatory Visit (INDEPENDENT_AMBULATORY_CARE_PROVIDER_SITE_OTHER): Payer: Medicare Other

## 2020-10-25 ENCOUNTER — Other Ambulatory Visit: Payer: Self-pay

## 2020-10-25 DIAGNOSIS — I5022 Chronic systolic (congestive) heart failure: Secondary | ICD-10-CM | POA: Diagnosis not present

## 2020-10-25 DIAGNOSIS — Z1231 Encounter for screening mammogram for malignant neoplasm of breast: Secondary | ICD-10-CM | POA: Diagnosis not present

## 2020-10-25 LAB — CUP PACEART REMOTE DEVICE CHECK
Battery Remaining Longevity: 180 mo
Battery Remaining Percentage: 100 %
Brady Statistic RV Percent Paced: 0 %
Date Time Interrogation Session: 20220125041100
HighPow Impedance: 92 Ohm
Implantable Lead Implant Date: 20211015
Implantable Lead Location: 753860
Implantable Lead Model: 672
Implantable Lead Serial Number: 166771
Implantable Pulse Generator Implant Date: 20211015
Lead Channel Impedance Value: 1099 Ohm
Lead Channel Setting Pacing Amplitude: 3.5 V
Lead Channel Setting Pacing Pulse Width: 0.4 ms
Lead Channel Setting Sensing Sensitivity: 0.5 mV
Pulse Gen Serial Number: 279742

## 2020-10-26 ENCOUNTER — Encounter: Payer: Medicare Other | Admitting: Cardiology

## 2020-10-31 ENCOUNTER — Other Ambulatory Visit: Payer: Self-pay

## 2020-10-31 ENCOUNTER — Ambulatory Visit (INDEPENDENT_AMBULATORY_CARE_PROVIDER_SITE_OTHER): Payer: Medicare Other | Admitting: Cardiology

## 2020-10-31 ENCOUNTER — Encounter: Payer: Self-pay | Admitting: Cardiology

## 2020-10-31 VITALS — BP 148/60 | HR 91 | Ht <= 58 in | Wt 124.0 lb

## 2020-10-31 DIAGNOSIS — I255 Ischemic cardiomyopathy: Secondary | ICD-10-CM | POA: Diagnosis not present

## 2020-10-31 DIAGNOSIS — I5022 Chronic systolic (congestive) heart failure: Secondary | ICD-10-CM | POA: Diagnosis not present

## 2020-10-31 DIAGNOSIS — Z95810 Presence of automatic (implantable) cardiac defibrillator: Secondary | ICD-10-CM | POA: Diagnosis not present

## 2020-10-31 DIAGNOSIS — I2510 Atherosclerotic heart disease of native coronary artery without angina pectoris: Secondary | ICD-10-CM

## 2020-10-31 NOTE — Progress Notes (Signed)
Electrophysiology Office Follow up Visit Note:    Date:  10/31/2020   ID:  Diane [name], DOB [birth year], MRN [id number]  PCP:  [name], MD  [facility] Cardiologist:  Peter Martinique, MD  [facility] Electrophysiologist:  Vickie Epley, MD    Interval History:    Diane [name] is a 65 y.o. female who presents for a follow up visit. She had an ICD implanted July 15, 2020 for ischemic cardiomyopathy. Since the implant, she has been doing well.  She says she can feel over the pocket that it is "lumpy".   Past Medical History:  Diagnosis Date  . Anemia   . Carotid stenosis    a. s/p right carotid stent 10/2017.  [name] Chronic systolic CHF (congestive heart failure) ([place])   . Colon polyps   . COPD (chronic obstructive pulmonary disease) ([place])   . Coronary artery disease    post CABG in 3/07 , coronary stents   . Diabetes mellitus    type 2  . Dyslipidemia   . Fibromyalgia   . GERD (gastroesophageal reflux disease)   . Headache    hx of   . Hyperlipidemia   . Hypertension   . Myocardial infarction ([place])   . Pneumonia    hx of   . Seizures ([place])    last seizure- 03/2013   . Shortness of breath dyspnea    with exertion or when fluid builds up   . Sleep apnea    used to wear a cpap- not used in 3 years   . Status post dilation of esophageal narrowing   . Stroke [facility]) 1993   problems with balance   . Systolic murmur    known mild AS and MR  . Thyroid goiter   . Tobacco abuse     Past Surgical History:  Procedure Laterality Date  . ABDOMINAL AORTOGRAM W/LOWER EXTREMITY N/A 04/18/2020   Procedure: ABDOMINAL AORTOGRAM W/LOWER EXTREMITY;  Surgeon: Lorretta Harp, MD;  Location: [place] [facility];  Service: Cardiovascular;  Laterality: N/A;  . APPENDECTOMY    . BIOPSY  02/03/2019   Procedure: BIOPSY;  Surgeon: Ladene Artist, MD;  Location: [facility];  Service: Endoscopy;;  . BREAST BIOPSY Left 2016  . CARDIAC CATHETERIZATION  11/29/05   EF of 55%  .  CARDIAC CATHETERIZATION  08/06/06   EF of 45-50%  . CARDIAC CATHETERIZATION N/A 05/06/2015   Procedure: Right/Left Heart Cath and Coronary/Graft Angiography;  Surgeon: Peter M Martinique, MD;  Location: [place] [facility];  Service: Cardiovascular;  Laterality: N/A;  . CAROTID PTA/STENT INTERVENTION N/A 10/10/2017   Procedure: CAROTID PTA/STENT INTERVENTION - Right;  Surgeon: Serafina Mitchell, MD;  Location: [place] [facility];  Service: Cardiovascular;  Laterality: N/A;  . CERVICAL FUSION  1990  . CHOLECYSTECTOMY    . COLON RESECTION     mass removed and 4 in of colon  . COLONOSCOPY WITH PROPOFOL N/A 07/16/2017   Procedure: COLONOSCOPY WITH PROPOFOL;  Surgeon: Ladene Artist, MD;  [facility]: [facility];  Service: Endoscopy;  Laterality: N/A;  . CORONARY ARTERY BYPASS GRAFT  12/04/2005   x5 -- left internal mammary artery to the LAD, left radial artery to the ramus intermedius, saphenous vein graft to the obtuse marginal 1, sequential saphenous vein grat to the acute marginal and posterior descending, endoscopic vein harvesting from the left thigh with open vein harvest from right leg  . CORONARY STENT PLACEMENT  08/11/06   PCI  of her ciurcumflex/OM vessel  . ESOPHAGOGASTRODUODENOSCOPY (EGD) WITH PROPOFOL N/A 02/03/2019   Procedure: ESOPHAGOGASTRODUODENOSCOPY (EGD) WITH PROPOFOL;  Surgeon: Ladene Artist, MD;  [facility];  Service: Endoscopy;  Laterality: N/A;  . ICD IMPLANT N/A 07/15/2020   Procedure: ICD IMPLANT;  Surgeon: Vickie Epley, MD;  Location: [place] [facility];  Service: Cardiovascular;  Laterality: N/A;  . LAPAROTOMY Bilateral 05/19/2015   Procedure: EXPLORATORY LAPAROTOMY WITH BILATERAL SALPINGO OOPHORECTOMY /OMENTECTOMY/SEGMENTAL SIGMOID COLECTOMY ;  Surgeon: Everitt Amber, MD;  [facility]: [facility];  Service: Gynecology;  Laterality: Bilateral;  . PERIPHERAL VASCULAR BALLOON ANGIOPLASTY  04/18/2020   Procedure: PERIPHERAL VASCULAR BALLOON ANGIOPLASTY;  Surgeon: Lorretta Harp, MD;  Location: [place] [facility];  Service: Cardiovascular;;  rt SFA  atherectomy and DCB  . PERIPHERAL VASCULAR CATHETERIZATION N/A 11/15/2015   Procedure: Carotid PTA/Stent Intervention;  Surgeon: Lorretta Harp, MD;  Location: [place] [facility];  Service: Cardiovascular;  Laterality: N/A;  . PERIPHERAL VASCULAR CATHETERIZATION  11/15/2015   Procedure: Carotid Angiography;  Surgeon: Lorretta Harp, MD;  Location: [place] [facility];  Service: Cardiovascular;;  . RIGHT HEART CATH N/A 01/27/2020   Procedure: RIGHT HEART CATH;  Surgeon: Larey Dresser, MD;  Location: [place] [facility];  Service: Cardiovascular;  Laterality: N/A;    Current Medications: No outpatient medications have been marked as taking for the 10/31/20 encounter (Office Visit) with Vickie Epley, MD.     Allergies:   Bee venom, Benadryl [diphenhydramine], Entresto [sacubitril-valsartan], Farxiga [dapagliflozin], Tetracycline, and Ace inhibitors   Social History   Socioeconomic History  . Marital status: Divorced    Spouse name: Not on file  . Number of children: 0  . Years of education: Not on file  . Highest education level: Not on file  Occupational History  . Occupation: disabled  Tobacco Use  . Smoking status: Current Every Day Smoker    Packs/day: 1.00    Years: 51.00    Pack years: 51.00    Types: Cigarettes    Start date: 77    Last attempt to quit: 02/22/2020    Years since quitting: 0.6  . Smokeless tobacco: Never Used  Vaping Use  . Vaping Use: Former  Substance and Sexual Activity  . Alcohol use: No  . Drug use: No  . Sexual activity: Not Currently    Partners: Male    Birth control/protection: None  Other Topics Concern  . Not on file  Social History Narrative   Right handed   Lives alone in a one story home   Social Determinants of Health   Financial Resource Strain: Not on file  Food Insecurity: No Food Insecurity  . Worried About Charity fundraiser in the  Last Year: Never true  . Ran Out of Food in the Last Year: Never true  Transportation Needs: Unmet Transportation Needs  . Lack of Transportation (Medical): Yes  . Lack of Transportation (Non-Medical): No  Physical Activity: Not on file  Stress: Not on file  Social Connections: Not on file     Family History: The patient's family history includes COPD in her mother; Cancer in her father, paternal uncle, and sister; Diabetes in her mother; Drug abuse in her paternal grandmother; Glaucoma in her sister; Heart attack in her father; Heart disease in her maternal aunt, maternal grandfather, and mother; Hyperlipidemia in her mother; Hypertension in her mother and sister; Lung cancer in her paternal aunt and paternal uncle; Melanoma in her  paternal uncle; Stroke in her paternal grandfather.  ROS:   Please see the history of present illness.    All other systems reviewed and are negative.  EKGs/Labs/Other Studies Reviewed:    The following studies were reviewed today:   October 31, 2020 device interrogation personally reviewed Presenting rhythm V sense VVI forty No events Battery longevity 15 years Ventricular lead shows pacing threshold of 0.7 V at 0.4 ms, pacing impedance 1070 ohms and intrinsic amplitude 20.3 mV  EKG:  The ekg ordered today demonstrates sinus rhythm, inferior Q waves  Recent Labs: 02/24/2020: Magnesium 1.7 05/31/2020: TSH 1.69 07/07/2020: ALT 14; Hemoglobin 14.7; Platelet Count 222 08/19/2020: BUN 20; Creatinine, Ser 0.86; Potassium 4.0; Sodium 132  Recent Lipid Panel    Component Value Date/Time   CHOL 103 08/19/2020 1454   TRIG 155 (H) 08/19/2020 1454   TRIG 182 (H) 10/10/2006 1254   HDL 27 (L) 08/19/2020 1454   CHOLHDL 3.8 08/19/2020 1454   VLDL 31 08/19/2020 1454   LDLCALC 45 08/19/2020 1454   LDLDIRECT 76.0 04/11/2017 1408    Physical Exam:    VS:  LMP 09/02/2003     Wt Readings from Last 3 Encounters:  09/13/20 123 lb (55.8 kg)  08/19/20 124 lb  12.8 oz (56.6 kg)  07/15/20 128 lb 6.4 oz (58.2 kg)     GEN:  Well nourished, well developed in no acute distress HEENT: Normal NECK: No JVD; No carotid bruits LYMPHATICS: No lymphadenopathy CARDIAC: RRR, no murmurs, rubs, gallops. Incision well-healed RESPIRATORY:  Clear to auscultation without rales, wheezing or rhonchi  ABDOMEN: Soft, non-tender, non-distended MUSCULOSKELETAL:  No edema; No deformity  SKIN: Warm and dry NEUROLOGIC:  Alert and oriented x 3 PSYCHIATRIC:  Normal affect   ASSESSMENT:    1. Chronic systolic congestive heart failure ([place])   2. Ischemic cardiomyopathy   3. ICD (implantable cardioverter-defibrillator), single, in situ   4. Coronary artery disease of bypass graft of native heart with stable angina pectoris ([place])    PLAN:    In order of problems listed above:  1. Chronic systolic heart failure post ICD Doing well after ICD implant. Device interrogation shows stable lead parameters and good battery longevity.  2. Coronary artery disease No ischemic symptoms on today's exam   Follow-up 9 months.   Medication Adjustments/Labs and Tests Ordered: Current medicines are reviewed at length with the patient today.  Concerns regarding medicines are outlined above.  Orders Placed This Encounter  Procedures  . EKG 12-Lead   No orders of the defined types were placed in this encounter.    Signed, Lars Mage, MD, [facility]  10/31/2020 2:02 PM    Electrophysiology [facility]

## 2020-10-31 NOTE — Patient Instructions (Signed)
Medication Instructions:  Your physician recommends that you continue on your current medications as directed. Please refer to the Current Medication list given to you today.  *If you need a refill on your cardiac medications before your next appointment, please call your pharmacy*   Lab Work: None ordered.  If you have labs (blood work) drawn today and your tests are completely normal, you will receive your results only by: [name] MyChart Message (if you have MyChart) OR . A paper copy in the mail If you have any lab test that is abnormal or we need to change your treatment, we will call you to review the results.   Testing/Procedures: None ordered.    Follow-Up: At [facility], you and your health needs are our priority.  As part of our continuing mission to provide you with exceptional heart care, we have created designated Provider Care Teams.  These Care Teams include your primary Cardiologist (physician) and Advanced Practice Providers (APPs -  Physician Assistants and Nurse Practitioners) who all work together to provide you with the care you need, when you need it.  We recommend signing up for the patient portal called "MyChart".  Sign up information is provided on this After Visit Summary.  MyChart is used to connect with patients for Virtual Visits (Telemedicine).  Patients are able to view lab/test results, encounter notes, upcoming appointments, etc.  Non-urgent messages can be sent to your provider as well.   To learn more about what you can do with MyChart, go to [email or web address].    Your next appointment:   9 month(s)  The format for your next appointment:   In Person  Provider:   Lars Mage, MD

## 2020-11-02 ENCOUNTER — Other Ambulatory Visit: Payer: Self-pay | Admitting: Family Medicine

## 2020-11-02 NOTE — Telephone Encounter (Signed)
Last filled on 09/05/20 #45g with 1 refill, CPE was on 06/07/20

## 2020-11-03 ENCOUNTER — Ambulatory Visit (HOSPITAL_COMMUNITY)
Admission: RE | Admit: 2020-11-03 | Discharge: 2020-11-03 | Disposition: A | Discharge status: Home / Self Care | Payer: Medicare Other | Source: Ambulatory Visit | Attending: Cardiovascular Disease | Admitting: Cardiovascular Disease

## 2020-11-03 ENCOUNTER — Other Ambulatory Visit: Payer: Self-pay

## 2020-11-03 ENCOUNTER — Other Ambulatory Visit (HOSPITAL_COMMUNITY): Payer: Self-pay | Admitting: Cardiovascular Disease

## 2020-11-03 DIAGNOSIS — I7090 Unspecified atherosclerosis: Secondary | ICD-10-CM | POA: Diagnosis not present

## 2020-11-03 DIAGNOSIS — I739 Peripheral vascular disease, unspecified: Secondary | ICD-10-CM

## 2020-11-05 NOTE — Progress Notes (Signed)
Remote ICD transmission.   

## 2020-11-17 ENCOUNTER — Other Ambulatory Visit (HOSPITAL_COMMUNITY): Payer: Self-pay | Admitting: Cardiology

## 2020-11-17 ENCOUNTER — Telehealth (HOSPITAL_COMMUNITY): Payer: Self-pay | Admitting: Pharmacy Technician

## 2020-11-17 ENCOUNTER — Other Ambulatory Visit: Payer: Self-pay

## 2020-11-17 ENCOUNTER — Ambulatory Visit (HOSPITAL_COMMUNITY)
Admission: RE | Admit: 2020-11-17 | Discharge: 2020-11-17 | Disposition: A | Discharge status: Home / Self Care | Payer: Medicare Other | Source: Ambulatory Visit | Attending: Cardiology | Admitting: Cardiology

## 2020-11-17 ENCOUNTER — Encounter (HOSPITAL_COMMUNITY): Payer: Self-pay | Admitting: Cardiology

## 2020-11-17 VITALS — BP 114/60 | HR 82 | Wt 126.8 lb

## 2020-11-17 DIAGNOSIS — Z8673 Personal history of transient ischemic attack (TIA), and cerebral infarction without residual deficits: Secondary | ICD-10-CM | POA: Insufficient documentation

## 2020-11-17 DIAGNOSIS — E1151 Type 2 diabetes mellitus with diabetic peripheral angiopathy without gangrene: Secondary | ICD-10-CM | POA: Insufficient documentation

## 2020-11-17 DIAGNOSIS — Z951 Presence of aortocoronary bypass graft: Secondary | ICD-10-CM | POA: Insufficient documentation

## 2020-11-17 DIAGNOSIS — Z7902 Long term (current) use of antithrombotics/antiplatelets: Secondary | ICD-10-CM | POA: Insufficient documentation

## 2020-11-17 DIAGNOSIS — I951 Orthostatic hypotension: Secondary | ICD-10-CM | POA: Insufficient documentation

## 2020-11-17 DIAGNOSIS — Z79899 Other long term (current) drug therapy: Secondary | ICD-10-CM | POA: Diagnosis not present

## 2020-11-17 DIAGNOSIS — Z8249 Family history of ischemic heart disease and other diseases of the circulatory system: Secondary | ICD-10-CM | POA: Diagnosis not present

## 2020-11-17 DIAGNOSIS — I779 Disorder of arteries and arterioles, unspecified: Secondary | ICD-10-CM | POA: Insufficient documentation

## 2020-11-17 DIAGNOSIS — Z955 Presence of coronary angioplasty implant and graft: Secondary | ICD-10-CM | POA: Insufficient documentation

## 2020-11-17 DIAGNOSIS — Z8742 Personal history of other diseases of the female genital tract: Secondary | ICD-10-CM | POA: Diagnosis not present

## 2020-11-17 DIAGNOSIS — I739 Peripheral vascular disease, unspecified: Secondary | ICD-10-CM

## 2020-11-17 DIAGNOSIS — I25810 Atherosclerosis of coronary artery bypass graft(s) without angina pectoris: Secondary | ICD-10-CM | POA: Diagnosis not present

## 2020-11-17 DIAGNOSIS — F17210 Nicotine dependence, cigarettes, uncomplicated: Secondary | ICD-10-CM | POA: Diagnosis not present

## 2020-11-17 DIAGNOSIS — I38 Endocarditis, valve unspecified: Secondary | ICD-10-CM | POA: Diagnosis not present

## 2020-11-17 DIAGNOSIS — I5022 Chronic systolic (congestive) heart failure: Secondary | ICD-10-CM | POA: Insufficient documentation

## 2020-11-17 DIAGNOSIS — Z794 Long term (current) use of insulin: Secondary | ICD-10-CM | POA: Insufficient documentation

## 2020-11-17 DIAGNOSIS — D509 Iron deficiency anemia, unspecified: Secondary | ICD-10-CM | POA: Insufficient documentation

## 2020-11-17 DIAGNOSIS — R0789 Other chest pain: Secondary | ICD-10-CM | POA: Insufficient documentation

## 2020-11-17 DIAGNOSIS — Z95810 Presence of automatic (implantable) cardiac defibrillator: Secondary | ICD-10-CM | POA: Insufficient documentation

## 2020-11-17 DIAGNOSIS — Z7982 Long term (current) use of aspirin: Secondary | ICD-10-CM | POA: Diagnosis not present

## 2020-11-17 DIAGNOSIS — I110 Hypertensive heart disease with heart failure: Secondary | ICD-10-CM | POA: Insufficient documentation

## 2020-11-17 LAB — BASIC METABOLIC PANEL
Anion gap: 9 (ref 5–15)
BUN: 11 mg/dL (ref 8–23)
CO2: 28 mmol/L (ref 22–32)
Calcium: 9.7 mg/dL (ref 8.9–10.3)
Chloride: 101 mmol/L (ref 98–111)
Creatinine, Ser: 0.81 mg/dL (ref 0.44–1.00)
GFR, Estimated: >60 mL/min (ref >60)
Glucose, Bld: 208 mg/dL — ABNORMAL HIGH (ref 70–99)
Potassium: 4.9 mmol/L (ref 3.5–5.1)
Sodium: 138 mmol/L (ref 135–145)

## 2020-11-17 LAB — DIGOXIN LEVEL: Digoxin Level: 0.7 ng/mL — ABNORMAL LOW (ref 0.8–2.0)

## 2020-11-17 MED ORDER — VERICIGUAT 5 MG PO TABS: 1.0000 | ORAL_TABLET | Freq: Every day | ORAL | 11 refills | Status: DC

## 2020-11-17 MED ORDER — METOLAZONE 2.5 MG PO TABS: 2.5000 mg | ORAL_TABLET | ORAL | 3 refills | Status: DC

## 2020-11-17 MED ORDER — VERICIGUAT 5 MG PO TABS: 1.0000 | ORAL_TABLET | Freq: Every day | ORAL | 3 refills | Status: DC

## 2020-11-17 MED FILL — VERQUVO 5 MG TABS: 5 | 90 days supply | Qty: 90 | Fill #0

## 2020-11-17 NOTE — Patient Instructions (Addendum)
Labs done today. We will contact you only if your labs are abnormal.  INCREASE Vericiguat 5mg  (1 tablet) by mouth daily.  No other medication changes were made. Please continue all current medications as prescribed.  Your physician recommends that you schedule a follow-up appointment in: 3 weeks for an appointment with our Clinic Pharmacist, Lauren and in 3 months with Dr. Aundra Dubin with an echo prior to your exam   If you have any questions or concerns before your next appointment please send [place] a message through [facility] or call our office at [phone].    TO LEAVE A MESSAGE FOR THE NURSE SELECT OPTION 2, PLEASE LEAVE A MESSAGE INCLUDING: . YOUR NAME . DATE OF BIRTH . CALL BACK NUMBER . REASON FOR CALL**this is important as we prioritize the call backs  YOU WILL RECEIVE A CALL BACK THE SAME DAY AS LONG AS YOU CALL BEFORE 4:00 PM   Do the following things EVERYDAY: 1) Weigh yourself in the morning before breakfast. Write it down and keep it in a log. 2) Take your medicines as prescribed 3) Eat low salt foods--Limit salt (sodium) to 2000 mg per day.  4) Stay as active as you can everyday 5) Limit all fluids for the day to less than 2 liters   At the [facility], you and your health needs are our priority. As part of our continuing mission to provide you with exceptional heart care, we have created designated Provider Care Teams. These Care Teams include your primary Cardiologist (physician) and Advanced Practice Providers (APPs- Physician Assistants and Nurse Practitioners) who all work together to provide you with the care you need, when you need it.   You may see any of the following providers on your designated Care Team at your next follow up: [name] Dr Glori Bickers . Dr Loralie Champagne . Darrick Grinder, NP . Lyda Jester, PA . Audry Riles, PharmD   Please be sure to bring in all your medications bottles to every appointment.

## 2020-11-17 NOTE — Telephone Encounter (Signed)
Patient was seen in clinic today and increased to 5mg  qd. Upon further review, it looks like she was increased to 5mg  11/21. She got the medication mailed from [place] at that time, none filled since. Philicia (CMA), updated the patient's medication list to reflect this increase. Also, requested that she send a 90 day RX to [place] for [place] to mail to her again. Her current 90 day co-pay is $4. The medication has to be ordered, will likely be in early next week.  Will call the patient to confirm if she would like samples while we wait for the medication to be delivered to her home.

## 2020-11-18 NOTE — Progress Notes (Signed)
PCP: [name], MD Cardiology: Dr. Martinique HF Cardiology: Dr. Aundra Dubin  65 y.o. with history of CAD, ischemic cardiomyopathy, CVA, and valvular disease was referred by Dr. Martinique for CHF evaluation.  Patient has an extensive history of vascular disease.  She had CABG in 2007.  Repeat LHC in 2007 showed all grafts closed except LIMA-LAD.  Patient had DES to LCx/OM.  [place] 2016 showed occlusion of DES to LCx/OM with L-L and L-R collaterals. Poor targets for redo CABG.   In 2/21, echo was done showing EF 20-25% with moderate RV dysfunction, moderate-severe MR, moderate TR.  Despite smoking history, PFTs in 2019 looked relatively normal. She additionally had the placement of a carotid stent on the right in 1/19.    RHC was done in 4/21, showing near-normal filling pressures with CI 2.12 by Fick.  CPX in 5/21 showed peak VO2 13, VE/VCO2 slope 37 => moderate HF limitation.  The PFTs on the CPX were not markedly abnormal. She saw Dr. Gwenlyn Found recently, peripheral arterial dopplers in 4/21 showed significant stenosis right SFA.   We worked her up for LVAD.  She was presented at [facility] and saw Dr. Prescott Gum.  He was concerned by her small stature and small LV cavity. Concern that Heartmate 3 would not function well in her small LV, and Heartware LVAD that was thought to be more suitable for small patients has been discontinued.   We considered her for a barostimulation trial, but she does not qualify for either ANTHEM or Batwire due to carotid disease.   She is off dapagliflozin due to recurrrent GU yeast infections.  In 7/21, she had atherectomy of right SFA.  [place]lin placed in 10/21.   As she was turned down for LVAD at [facility], I had her seen at [facility]. She underwent workup there with echo in 11/21 showing higher EF 35%. RHC, however, showed low CI 1.8.    Patient returns for followup of CHF.  She is still smoking but trying to cut back.  She still has right calf and thigh cramps after walking  about 100 feet (to mailbox).  She also has right buttock area pain that also may be claudication.  Weight up 3 lbs.  No dyspnea walking short distances on flat ground.  Short of breath walking up stairs/inclines.  She is taking care of a friend who is homebound, this keeps her active.  Rare atypical chest pain.  No lightheadedness.      ECG (personally reviewed): NSR, inferior Qs                Labs (4/21): K 3.7 => 3.4, creatinine 0.78 => 0.7, hgb 11.3 => 14.4 Labs (5/21): digoxin 0.6, K 3.5, creatinine 0.63 Labs (6/21): K 4.1, creatinine 0.7, LDL 61, TGs 247 Labs (7/21): K 5, creatinine 0.76, hgb 16.9.  Labs (10/21): K 4.6, creatinine 0.86 Labs (11/21): digoxin 0.9, LDL 45, HDL 27, K 4, creatinine 0.86  PMH: 1. CAD: CABG 2007.  - LHC in 2007 showed all grafts closed except LIMA-LAD.  Patient had DES to LCx/OM.   - [place] 2016 showed occlusion of DES to LCx/OM with L-L and L-R collaterals.  - Not candidate for redo CABG with poor targets.  2. Chronic systolic CHF: Ischemic cardiomyopathy.   - Echo (2/21): EF 20-25%, moderately dilated and moderately dysfunctional RV, PASP 49, severe biatrial enlargement, moderate-severe MR, severe TR, mild-moderate AS, IVC dilated.  - RHC (4/21): mean RA 6, PA 41/16, mean 29,  mean PCWP 15, CI 2.12, PVR 4.3 WU - CPX (5/21): peak VO2 13, VE/VCO2 slope 37, RER 1.06 => moderate HF limitation.  - Echo (11/21): EF 35%, mild LVH.  - RHC (11/21): baseline CI 1.8, exercise peak VO2 12.2, PCWP up to 24 with exercise.  3. Fe deficiency anemia: FOBT negative.  4. Carotid stenosis: Right carotid stent in 1/19.  - Carotid dopplers (7/78): 24-23% LICA stenosis.  - Carotid dopplers (5/36): 14-43% LICA stenosis.  5. H/o CVA 6. HTN 7. Type 2 diabetes 8. Hyperlipidemia 9. Seizure disorder 10. OSA: Cannot tolerate CPAP.  11. Active smoker: PFTs in 2019 were relatively normal.  She does use oxygen at night.  - PFTs (5/21): FEV1 82%, FVC 80%, ratio 102% => minimal  obstruction.  12. PAD: ABIs (4/21) 0.76 on right, 0.86 on left => significant right SFA stenosis.  - Atherectomy/angioplasty right SFA.  - Peripheral arterial dopplers (2/22): 50-74% stenosis right SFA.  13. Chronic penetrating atherosclerotic ulcer descending thoracic aorta.   Social History   Socioeconomic History  . Marital status: Divorced    Spouse name: Not on file  . Number of children: 0  . Years of education: Not on file  . Highest education level: Not on file  Occupational History  . Occupation: disabled  Tobacco Use  . Smoking status: Current Every Day Smoker    Packs/day: 1.00    Years: 51.00    Pack years: 51.00    Types: Cigarettes    Start date: 68    Last attempt to quit: 02/22/2020    Years since quitting: 0.7  . Smokeless tobacco: Never Used  Vaping Use  . Vaping Use: Former  Substance and Sexual Activity  . Alcohol use: No  . Drug use: No  . Sexual activity: Not Currently    Partners: Male    Birth control/protection: None  Other Topics Concern  . Not on file  Social History Narrative   Right handed   Lives alone in a one story home   Social Determinants of Health   Financial Resource Strain: Not on file  Food Insecurity: No Food Insecurity  . Worried About Charity fundraiser in the Last Year: Never true  . Ran Out of Food in the Last Year: Never true  Transportation Needs: Unmet Transportation Needs  . Lack of Transportation (Medical): Yes  . Lack of Transportation (Non-Medical): No  Physical Activity: Not on file  Stress: Not on file  Social Connections: Not on file  Intimate Partner Violence: Not on file   Family History  Problem Relation Age of Onset  . Heart disease Mother   . Diabetes Mother   . COPD Mother   . Hyperlipidemia Mother   . Hypertension Mother   . Cancer Father        met, origin unknown  . Heart attack Father   . Drug abuse Paternal Grandmother   . Stroke Paternal Grandfather   . Lung cancer Paternal Aunt         lung with mets to brain  . Melanoma Paternal Uncle   . Lung cancer Paternal Uncle        lung/liver to brain  . Cancer Paternal Uncle        cancer of unknown type  . Heart disease Maternal Grandfather   . Hypertension Sister   . Cancer Sister        eyelid  . Glaucoma Sister   . Heart disease Maternal Aunt  x 2 aunts   ROS: All systems reviewed and negative except as per HPI.   Current Outpatient Medications  Medication Sig Dispense Refill  . acetaminophen (TYLENOL) 500 MG tablet Take 1,000 mg by mouth every 6 (six) hours as needed for moderate pain or headache.    . albuterol (VENTOLIN HFA) 108 (90 Base) MCG/ACT inhaler INHALE 2 PUFFS EVERY 6 HOURS AS NEEDED FOR WHEEZING/SHORTNESS OF BREATH 18 each 1  . aspirin EC 81 MG tablet Take 81 mg by mouth every evening.  30 tablet 6  . clopidogrel (PLAVIX) 75 MG tablet TAKE 1 TABLET DAILY 90 tablet 3  . DEXILANT 60 MG capsule TAKE 1 CAPSULE DAILY 90 capsule 1  . digoxin (LANOXIN) 0.125 MG tablet TAKE 0.5 TABLETS (0.0625 MG TOTAL) BY MOUTH DAILY. 45 tablet 3  . diphenhydrAMINE (BENADRYL) 25 mg capsule Take 25 mg by mouth 2 (two) times daily as needed for itching or allergies.     [name] ezetimibe (ZETIA) 10 MG tablet TAKE 1 TABLET BY MOUTH EVERY DAY 90 tablet 2  . fluticasone (FLONASE) 50 MCG/ACT nasal spray Place 2 sprays into both nostrils daily. 48 g 1  . gabapentin (NEURONTIN) 300 MG capsule TAKE 2 CAPSULES BY MOUTH EVERY DAY AT BEDTIME 180 capsule 1  . Glycopyrrolate-Formoterol (BEVESPI AEROSPHERE) 9-4.8 MCG/ACT AERO Inhale 2 puffs into the lungs 2 (two) times daily. 32.1 g 1  . guaiFENesin (MUCINEX) 600 MG 12 hr tablet Take 2 tablets (1,200 mg total) by mouth 2 (two) times daily. 360 tablet 3  . HYDROcodone-acetaminophen (NORCO) 5-325 MG tablet Take 1 tablet by mouth daily as needed for severe pain. 15 tablet 0  . insulin aspart (NOVOLOG) 100 UNIT/ML injection Inject 45 Units into the skin in the morning and at bedtime.    . iron  polysaccharides (NIFEREX) 150 MG capsule Take 150 mg by mouth. 3 times per week    . levETIRAcetam (KEPPRA) 1000 MG tablet Take 1 tablet (1,000 mg total) by mouth 2 (two) times daily. 180 tablet 3  . metFORMIN (GLUCOPHAGE-XR) 500 MG 24 hr tablet Take 500 mg by mouth every evening.   6  . nicotine polacrilex (NICORETTE) 4 MG gum Take 1 each (4 mg total) by mouth as needed for smoking cessation. 90 tablet 2  . nitroGLYCERIN (NITROLINGUAL) 0.4 MG/SPRAY spray Place 1 spray under the tongue every 5 (five) minutes x 3 doses as needed for chest pain. 12 g 3  . NYSTATIN powder APPLY 1 APPLICATION        TOPICALLY 4 TIMES A DAY AS NEEDED FOR YEAST INFECTIONS 60 g 3  . ONE TOUCH ULTRA TEST test strip USE AS DIRECTED FOR TESTING BLOOD GLUCOSE 3 TIMES DAILY  1  . polyethylene glycol (MIRALAX / GLYCOLAX) 17 g packet Take 17 g by mouth daily.    . potassium chloride SA (KLOR-CON) 20 MEQ tablet Take 1 tablet (20 mEq total) by mouth 2 (two) times daily.    . promethazine (PHENERGAN) 25 MG tablet Take 1 tablet (25 mg total) by mouth every 8 (eight) hours as needed for nausea or vomiting. Caution of sedation 30 tablet 1  . ranolazine (RANEXA) 1000 MG SR tablet TAKE 1 TABLET TWICE A DAY 180 tablet 3  . rosuvastatin (CRESTOR) 20 MG tablet Take 1 tablet (20 mg total) by mouth daily. 30 tablet 6  . sodium chloride (OCEAN) 0.65 % SOLN nasal spray Place 1 spray into both nostrils as needed for congestion.    [name] spironolactone (ALDACTONE)  25 MG tablet Take 1 tablet (25 mg total) by mouth at bedtime. 90 tablet 3  . torsemide (DEMADEX) 20 MG tablet TAKE 3 TABLETS (60 MG TOTAL) BY MOUTH EVERY MORNING AND 2 TABLETS (40 MG TOTAL) EVERY EVENING. 450 tablet 3  . VASCEPA 1 g capsule TAKE 2 CAPSULES (2 G TOTAL) BY MOUTH 2 (TWO) TIMES DAILY. 120 capsule 3  . metolazone (ZAROXOLYN) 2.5 MG tablet Take 1 tablet (2.5 mg total) by mouth 2 (two) times a week. Every Mon and Fri 26 tablet 3  . Vericiguat 5 MG TABS Take 1 tablet (5 mg total)  by mouth daily. 90 tablet 3   No current facility-administered medications for this encounter.   BP 114/60   Pulse 82   Wt 57.5 kg (126 lb 12.8 oz)   LMP 09/02/2003   SpO2 99%   BMI 26.50 kg/m  General: NAD Neck: No JVD, no thyromegaly or thyroid nodule.  Lungs: Clear to auscultation bilaterally with normal respiratory effort. CV: Nondisplaced PMI.  Heart regular S1/S2, no S3/S4, no murmur.  No peripheral edema.  No carotid bruit.  Unable to palpate pedal pulses.  Abdomen: Soft, nontender, no hepatosplenomegaly, no distention.  Skin: Intact without lesions or rashes.  Neurologic: Alert and oriented x 3.  Psych: Normal affect. Extremities: No clubbing or cyanosis.  HEENT: Normal.   Assessment/Plan:  1. Chronic systolic CHF: Ischemic cardiomyopathy, [employer] ICD.  Echo in 2/21 showed EF 20-25%, moderately dilated and moderately dysfunctional RV, PASP 49, severe biatrial enlargement, moderate-severe MR, severe TR, mild-moderate AS, IVC dilated. CPX (5/21) with moderate HF limitation, RHC (4/21) with CI low at 2.12.  Medication titration has been severely limited by hypotension and orthostatic symptoms.  Not a transplant candidate due to smoking. She was seen by cardiac surgery at [facility] and not thought to be good LVAD candidate due to small stature, small LV cavity, and redo sternotomy.  I sent her for a 2nd opinion at [facility].  She had echo there in 11/21 with EF higher at 35% but CI low on RHC at 1.8.  NYHA class III symptoms, stable.  She is not volume overloaded on exam.  - Continue digoxin 0.0625 mg daily, check level today.   - Continue torsemide 60 qam/40 qpm.  Continue metolazone 2.5 mg bid, Monday/Friday.  BMET today.  - Continue spironolactone 25 mg daily.  - She has not tolerated BP-active meds well, avoid Coreg and Entresto for now.  She became hypotensive with losartan.  - She did not tolerate SGLT2 inhibitor due to recurrent GU yeast infections.  - Increase  vericiguat to 5 mg daily.  Followup with clinical pharmacist in 3 wks to see if we can increase vericiguat to 10 mg daily.  - She does not qualify for the barostimulation trials due to carotid disease.  - With higher EF on last echo, she was not a candidate for LVAD.  I will repeat echo at followup in 3 months.  2. CAD: s/p CABG 0932 complicated by early graft closure.  Repeat cath in 2007 showed only LIMA-LAD still open and she had DES to LCX/OM.  Cath in 2016 showed this stent was occluded.  She has not had targets for redo CABG and she does not have good interventional targets. She has occasional atypical chest pain. - Continue ASA 81, Plavix 75 mg daily.  - Continue ranolazone 1000 bid.   - Continue Crestor 20 mg daily and Zetia 10 mg daily.  Good lipids in 11/21.  3. Smoking: Still smoking 3/4 ppd.  She is trying to quit, does not want pharmacological assistance.    4. PAD: s/p atherectomy/angioplasty right SFA in 7/21.  Still with significant claudication. Peripheral arterial dopplers in 2/22 with 50-74% right SFA stenosis.  - Follows with Dr. Gwenlyn Found for PV.  5. Carotid stenosis: Moderate stenosis, repeat dopplers 6/22.   6. Fe deficiency anemia: Has been FOBT negative.  Has had Ferahema.  Most recent hgb looks stable.   Followup 3 wks in pharmacy clinic for med titration, see me with echo in 3 months.    Loralie Champagne 11/18/2020

## 2020-11-18 NOTE — Telephone Encounter (Signed)
Called and spoke with the patient. She would like some samples of Verquvo while she waits for [facility] to send it to her home.  Messaged Philicia (CMA) to obtain a bottle of samples for her.

## 2020-11-27 ENCOUNTER — Other Ambulatory Visit: Payer: Self-pay | Admitting: Pulmonary Disease

## 2020-11-27 DIAGNOSIS — J9611 Chronic respiratory failure with hypoxia: Secondary | ICD-10-CM

## 2020-12-02 ENCOUNTER — Telehealth: Payer: Self-pay | Admitting: *Deleted

## 2020-12-02 MED ORDER — GUAIFENESIN-CODEINE 100-10 MG/5ML PO SYRP
5.0000 mL | ORAL_SOLUTION | Freq: Three times a day (TID) | ORAL | 0 refills | Status: DC | PRN
Start: 2020-12-02 — End: 2020-12-02

## 2020-12-02 MED ORDER — GUAIFENESIN-CODEINE 100-10 MG/5ML PO SYRP: 5.0000 mL | ORAL_SOLUTION | Freq: Three times a day (TID) | ORAL | 0 refills | Status: DC | PRN

## 2020-12-02 NOTE — Telephone Encounter (Signed)
Left VM requesting pt to call the office back  Also let Almyra Free, RN know what Dr. Glori Bickers advised

## 2020-12-02 NOTE — Telephone Encounter (Signed)
Almyra Free nurse with [facility] called stating that she saw the patient today and she has a cough that is keeping her up at night. Almyra Free stated that patient's lungs are clear and she does not have a fever. Almyra Free stated that the patient is wanting to know if Dr. Glori Bickers will prescribe her a cough medication. Almyra Free stated that the patient told her that she feels the cough may be due to allergies. Almyra Free stated that the patient told her that Dr. Glori Bickers has given her cough medication with codeine in it and wants to know if she will send a script in for her. Almyra Free stated that the [facility] uses [facility]

## 2020-12-02 NOTE — Telephone Encounter (Signed)
She has copd and chronic cough   I sent the px Use with caution of sedation and habit and constipation   Let me know if not helpful or if symptoms worsen

## 2020-12-05 LAB — CUP PACEART INCLINIC DEVICE CHECK
Date Time Interrogation Session: 20220131140000
Implantable Lead Implant Date: 20211015
Implantable Lead Location: 753860
Implantable Lead Model: 672
Implantable Lead Serial Number: 166771
Implantable Pulse Generator Implant Date: 20211015
Lead Channel Pacing Threshold Amplitude: 0.7 V
Lead Channel Pacing Threshold Pulse Width: 0.4 ms
Lead Channel Sensing Intrinsic Amplitude: 20.3 mV
Lead Channel Setting Pacing Amplitude: 3.5 V
Lead Channel Setting Pacing Pulse Width: 0.4 ms
Lead Channel Setting Sensing Sensitivity: 0.5 mV
Pulse Gen Serial Number: 279742

## 2020-12-05 NOTE — Telephone Encounter (Signed)
Advised pt of PCP msg. [facility] reports [facility] but they did not have it. It is supposed to be in tomorrow. Advised if any other problems contact other pharmacies because usually the smaller pharmacies, have this medication. Advised if any symptoms worsened to contact office. Pt verbalized understanding.

## 2020-12-05 NOTE — Telephone Encounter (Signed)
Diane [name] - Client Nonclinical Telephone Record  AccessNurse Client [name] - Client Client Site [place] [facility], Roque Lias - MD Contact Type Call Who Is Calling Patient / Member / Family / Caregiver Caller Name Ellarie Picking Caller Phone Number [phone] Call Type Message Only Information Provided Reason for Call Returning a Call from the Office Initial [place] returning a missed call. She thinks it was the nurse. Disp. Time Disposition Final User 12/02/2020 5:03:11 PM General Information Provided Yes Kathlynn Grate Call Closed By: Kathlynn Grate Transaction Date/Time: 12/02/2020 5:01:04 PM (ET)   LVM for pt to return call to make sure she is aware of the prescription.

## 2020-12-06 ENCOUNTER — Other Ambulatory Visit: Payer: Self-pay

## 2020-12-06 ENCOUNTER — Encounter: Payer: Self-pay | Admitting: Cardiovascular Disease

## 2020-12-06 ENCOUNTER — Ambulatory Visit (INDEPENDENT_AMBULATORY_CARE_PROVIDER_SITE_OTHER): Payer: Medicare Other | Admitting: Cardiovascular Disease

## 2020-12-06 VITALS — BP 130/73 | HR 68 | Ht <= 58 in | Wt 124.6 lb

## 2020-12-06 DIAGNOSIS — E7800 Pure hypercholesterolemia, unspecified: Secondary | ICD-10-CM | POA: Diagnosis not present

## 2020-12-06 DIAGNOSIS — Z951 Presence of aortocoronary bypass graft: Secondary | ICD-10-CM | POA: Diagnosis not present

## 2020-12-06 DIAGNOSIS — I7090 Unspecified atherosclerosis: Secondary | ICD-10-CM | POA: Diagnosis not present

## 2020-12-06 DIAGNOSIS — E785 Hyperlipidemia, unspecified: Secondary | ICD-10-CM

## 2020-12-06 DIAGNOSIS — E1169 Type 2 diabetes mellitus with other specified complication: Secondary | ICD-10-CM

## 2020-12-06 DIAGNOSIS — I6523 Occlusion and stenosis of bilateral carotid arteries: Secondary | ICD-10-CM

## 2020-12-06 DIAGNOSIS — I255 Ischemic cardiomyopathy: Secondary | ICD-10-CM

## 2020-12-06 DIAGNOSIS — F17200 Nicotine dependence, unspecified, uncomplicated: Secondary | ICD-10-CM

## 2020-12-06 NOTE — Assessment & Plan Note (Signed)
History of carotid artery disease status post right carotid stenting by myself 10/10/2017.  She did have 70% bilateral ICA stenosis by angiography which I performed 11/15/2015.  Most recent Dopplers performed 03/07/2020 revealed a widely patent right carotid stent with moderate left ICA stenosis.  Dopplers will be repeated on annual basis.

## 2020-12-06 NOTE — Assessment & Plan Note (Signed)
History of peripheral vascular occlusive disease status post right SFA directional atherectomy followed by drug-coated balloon angioplasty by myself 04/18/2020.  She had high-grade segmental disease in the left SFA as well with three-vessel runoff bilaterally.  Unfortunately, this did not improve her claudication symptoms.  Her Dopplers performed 11/03/2020 revealed a right ABI of 0.93 with a patent right SFA with moderate restenosis within the proximal portion and a left ABI of 0.71.

## 2020-12-06 NOTE — Assessment & Plan Note (Signed)
History of ischemic cardiomyopathy echo performed 2/618/21 revealing EF in the 20 to 25% range.  She is on optimal medical therapy followed by Dr. Aundra Dubin.  She recently had an ICD placed for primary prevention by Dr. Quentin Ore 07/15/2020.

## 2020-12-06 NOTE — Assessment & Plan Note (Signed)
History of CAD status post coronary artery bypass grafting March 2007 by Dr. [name].  She has had angiographically documented occluded vein grafts with a patent LIMA pulmonary hypertension.  Her last cath performed by Dr. Martinique in 2016 revealed poor targets for redo CABG.  She gets occasional although fairly infrequent chest pain.

## 2020-12-06 NOTE — Patient Instructions (Signed)
Medication Instructions:  Your physician recommends that you continue on your current medications as directed. Please refer to the Current Medication list given to you today.  *If you need a refill on your cardiac medications before your next appointment, please call your pharmacy*  Testing/Procedures: Your physician has requested that you have a carotid duplex. This test is an ultrasound of the carotid arteries in your neck. It looks at blood flow through these arteries that supply the brain with blood. Allow one hour for this exam. There are no restrictions or special instructions.  To be done in June on 2022.  Your physician has requested that you have a lower extremity arterial duplex. This test is an ultrasound of the arteries in the legs. It looks at arterial blood flow in the legs. Allow one hour for Lower Arterial scans. There are no restrictions or special instructions To be done in Feb 2023  Your physician has requested that you have an ankle brachial index (ABI). During this test an ultrasound and blood pressure cuff are used to evaluate the arteries that supply the arms and legs with blood. Allow thirty minutes for this exam. There are no restrictions or special instructions. To be done in Feb 2023  These procedures is done at [facility]. 2nd Floor.  Follow-Up: At [name], you and your health needs are our priority.  As part of our continuing mission to provide you with exceptional heart care, we have created designated Provider Care Teams.  These Care Teams include your primary Cardiologist (physician) and Advanced Practice Providers (APPs -  Physician Assistants and Nurse Practitioners) who all work together to provide you with the care you need, when you need it.  We recommend signing up for the patient portal called "MyChart".  Sign up information is provided on this After Visit Summary.  MyChart is used to connect with patients for Virtual Visits (Telemedicine).   Patients are able to view lab/test results, encounter notes, upcoming appointments, etc.  Non-urgent messages can be sent to your provider as well.   To learn more about what you can do with MyChart, go to [email or web address].    Your next appointment:   6 month(s)  The format for your next appointment:   In Person  Provider:   You will see one of the following Advanced Practice Providers on your designated Care Team:    Sande Rives, PA-C  Coletta Memos, FNP  Then, Quay Burow, MD will plan to see you again in 12 month(s).

## 2020-12-06 NOTE — Assessment & Plan Note (Signed)
History of ongoing tobacco abuse of 1 pack/day recalcitrant to risk factor modification. 

## 2020-12-06 NOTE — Assessment & Plan Note (Signed)
History of hyperlipidemia on statin therapy with lipid profile performed 08/19/2020 revealing total cholesterol of 103, LDL of 45 and HDL 27.

## 2020-12-06 NOTE — Progress Notes (Signed)
12/06/2020 [place]   [birth year]  [id number]  Primary Physician Tower, [name], MD Primary Cardiologist: Lorretta Harp MD Garret Reddish, [place], [place]  HPI:  Diane [name] is a 65 y.o.  Caucasian female with a history of CAD status post coronary artery bypass grafting March 2007 by Dr. [name].I last saw her in the office 05/03/2020..She's had subsequent stenting and known occluded grafts except for her her LIMA. She has severe LV dysfunction with moderate pulmonary hypertension. I last saw her in the office 06/20/16. Her mother, [name], was a long-term patient of mine as well. Her other Problems include diabetes, hypokalemia, hypertension and ongoing tobacco abuse. She's had progression of her bilateral carotid artery stenosis left greater than right . She is neurologically asymptomatic. She is high risk for carotid endarterectomy. I performed cerebral angiography and her 11/15/15 revealing 70% bilateral ICA stenosis with pins beyond the stenosis and significant calcification on the left making stenting not appropriate. Her carotid Dopplers performed on 06/26/16 showed stable bilateral moderate ICA stenosis and lower extremity Dopplers performed 12/12/16 revealed ABIs 0.93 bilaterally with moderate but not severe disease. She does complain of some leg pain which has some atypical characteristics for claudication  She does continue to smoke but is trying to stop. She has ischemic cardiomyopathy with an EF of 20% range followed by Dr. Algernon Huxley. Her last cath in 2016 by Dr. Martinique revealed occluded grafts with poor targets for redo CABG. I did perform carotid stenting on her with Dr. Trula Slade 10/10/2017. She has less tolerating claudication with ABIs in the 0.7-0.8 range bilaterally. She has high-frequency signal in her proximal right SFA.  She apparently was turned down for LVAD therapy because her heart was too small. She is compensated. She wishes to proceed with right SFA  intervention for lifestyle limiting claudication.  I performed peripheral intervention on her 04/18/2020.  She had [facility] one directional atherectomy followed by drug-coated balloon angioplasty of high-grade segmental proximal right SFA stenosis with three-vessel runoff.  She had excellent angiographic result.  Her Dopplers performed on 04/19/2020 showed residual mild increased frequency in her proximal right SFA with ABIs increased from 0.76-0.89.  She does complain of some pain in her groin and knee.  Is unclear to me why her claudication has not improved however.  She does continue to smoke 1 pack/day.  Since I saw her a year ago she continues to remain stable.  She did undergo ICD implantation by Dr. Quentin Ore for primary prevention 07/15/2020.  She continues to complain of claudication despite endovascular therapy of her right SFA suggesting that fixing her left leg would not be clinically efficacious.   Current Meds  Medication Sig  . acetaminophen (TYLENOL) 500 MG tablet Take 1,000 mg by mouth every 6 (six) hours as needed for moderate pain or headache.  . albuterol (VENTOLIN HFA) 108 (90 Base) MCG/ACT inhaler INHALE 2 PUFFS EVERY 6 HOURS AS NEEDED FOR WHEEZING/SHORTNESS OF BREATH  . aspirin EC 81 MG tablet Take 81 mg by mouth every evening.   . clopidogrel (PLAVIX) 75 MG tablet TAKE 1 TABLET DAILY  . DEXILANT 60 MG capsule TAKE 1 CAPSULE DAILY  . digoxin (LANOXIN) 0.125 MG tablet TAKE 0.5 TABLETS (0.0625 MG TOTAL) BY MOUTH DAILY.  . diphenhydrAMINE (BENADRYL) 25 mg capsule Take 25 mg by mouth 2 (two) times daily as needed for itching or allergies.   [name] ezetimibe (ZETIA) 10 MG tablet TAKE 1 TABLET BY MOUTH EVERY DAY  . fluticasone (  FLONASE) 50 MCG/ACT nasal spray Place 2 sprays into both nostrils daily.  [name] gabapentin (NEURONTIN) 300 MG capsule TAKE 2 CAPSULES BY MOUTH EVERY DAY AT BEDTIME  . Glycopyrrolate-Formoterol (BEVESPI AEROSPHERE) 9-4.8 MCG/ACT AERO Inhale 2 puffs into the lungs 2 (two)  times daily.  [name] guaiFENesin (MUCINEX) 600 MG 12 hr tablet Take 2 tablets (1,200 mg total) by mouth 2 (two) times daily.  [name] guaiFENesin-codeine (ROBITUSSIN AC) 100-10 MG/5ML syrup Take 5 mLs by mouth 3 (three) times daily as needed for cough.  [name] HYDROcodone-acetaminophen (NORCO) 5-325 MG tablet Take 1 tablet by mouth daily as needed for severe pain.  [name] insulin aspart (NOVOLOG) 100 UNIT/ML injection Inject 45 Units into the skin in the morning and at bedtime.  . iron polysaccharides (NIFEREX) 150 MG capsule Take 150 mg by mouth. 3 times per week  . levETIRAcetam (KEPPRA) 1000 MG tablet Take 1 tablet (1,000 mg total) by mouth 2 (two) times daily.  . metFORMIN (GLUCOPHAGE-XR) 500 MG 24 hr tablet Take 500 mg by mouth every evening.   . metolazone (ZAROXOLYN) 2.5 MG tablet Take 1 tablet (2.5 mg total) by mouth 2 (two) times a week. Every Mon and Fri  . nicotine polacrilex (NICORETTE) 4 MG gum Take 1 each (4 mg total) by mouth as needed for smoking cessation.  . nitroGLYCERIN (NITROLINGUAL) 0.4 MG/SPRAY spray Place 1 spray under the tongue every 5 (five) minutes x 3 doses as needed for chest pain.  [name] NYSTATIN powder APPLY 1 APPLICATION        TOPICALLY 4 TIMES A DAY AS NEEDED FOR YEAST INFECTIONS  . ONE TOUCH ULTRA TEST test strip USE AS DIRECTED FOR TESTING BLOOD GLUCOSE 3 TIMES DAILY  . polyethylene glycol (MIRALAX / GLYCOLAX) 17 g packet Take 17 g by mouth daily.  . potassium chloride SA (KLOR-CON) 20 MEQ tablet Take 1 tablet (20 mEq total) by mouth 2 (two) times daily.  . promethazine (PHENERGAN) 25 MG tablet Take 1 tablet (25 mg total) by mouth every 8 (eight) hours as needed for nausea or vomiting. Caution of sedation  . ranolazine (RANEXA) 1000 MG SR tablet TAKE 1 TABLET TWICE A DAY  . rosuvastatin (CRESTOR) 20 MG tablet Take 1 tablet (20 mg total) by mouth daily.  . sodium chloride (OCEAN) 0.65 % SOLN nasal spray Place 1 spray into both nostrils as needed for congestion.  [name] spironolactone  (ALDACTONE) 25 MG tablet Take 1 tablet (25 mg total) by mouth at bedtime.  . torsemide (DEMADEX) 20 MG tablet TAKE 3 TABLETS (60 MG TOTAL) BY MOUTH EVERY MORNING AND 2 TABLETS (40 MG TOTAL) EVERY EVENING.  . VASCEPA 1 g capsule TAKE 2 CAPSULES (2 G TOTAL) BY MOUTH 2 (TWO) TIMES DAILY.  [name] Vericiguat 5 MG TABS Take 1 tablet (5 mg total) by mouth daily.     Allergies  Allergen Reactions  . Bee Venom Itching and Swelling  . Benadryl [Diphenhydramine] Other (See Comments)    Insomnia   . Entresto [Sacubitril-Valsartan] Cough  . Farxiga [Dapagliflozin]     Yeast infection  . Tetracycline     Unknown, pt cannot recall exact reaction  . Ace Inhibitors Cough    Social History   Socioeconomic History  . Marital status: Divorced    Spouse name: Not on file  . Number of children: 0  . Years of education: Not on file  . Highest education level: Not on file  Occupational History  . Occupation: disabled  Tobacco Use  . Smoking  status: Current Every Day Smoker    Packs/day: 1.00    Years: 51.00    Pack years: 51.00    Types: Cigarettes    Start date: 73    Last attempt to quit: 02/22/2020    Years since quitting: 0.7  . Smokeless tobacco: Never Used  Vaping Use  . Vaping Use: Former  Substance and Sexual Activity  . Alcohol use: No  . Drug use: No  . Sexual activity: Not Currently    Partners: Male    Birth control/protection: None  Other Topics Concern  . Not on file  Social History Narrative   Right handed   Lives alone in a one story home   Social Determinants of Health   Financial Resource Strain: Not on file  Food Insecurity: No Food Insecurity  . Worried About Charity fundraiser in the Last Year: Never true  . Ran Out of Food in the Last Year: Never true  Transportation Needs: Unmet Transportation Needs  . Lack of Transportation (Medical): Yes  . Lack of Transportation (Non-Medical): No  Physical Activity: Not on file  Stress: Not on file  Social Connections:  Not on file  Intimate Partner Violence: Not on file     Review of Systems: General: negative for chills, fever, night sweats or weight changes.  Cardiovascular: negative for chest pain, dyspnea on exertion, edema, orthopnea, palpitations, paroxysmal nocturnal dyspnea or shortness of breath Dermatological: negative for rash Respiratory: negative for cough or wheezing Urologic: negative for hematuria Abdominal: negative for nausea, vomiting, diarrhea, bright red blood per rectum, melena, or hematemesis Neurologic: negative for visual changes, syncope, or dizziness All other systems reviewed and are otherwise negative except as noted above.    Blood pressure 130/73, pulse 68, height 4\' 10"  (1.473 m), weight 124 lb 9.6 oz (56.5 kg), last menstrual period 09/02/2003, SpO2 94 %.  General appearance: alert and no distress Neck: no adenopathy, no JVD, supple, symmetrical, trachea midline, thyroid not enlarged, symmetric, no tenderness/mass/nodules and Left carotid bruit Lungs: clear to auscultation bilaterally Heart: regular rate and rhythm, S1, S2 normal, no murmur, click, rub or gallop Extremities: extremities normal, atraumatic, no cyanosis or edema Pulses: 2+ and symmetric Diminished pedal pulses Skin: Skin color, texture, turgor normal. No rashes or lesions Neurologic: Alert and oriented X 3, normal strength and tone. Normal symmetric reflexes. Normal coordination and gait  EKG not performed today  ASSESSMENT AND PLAN:   Carotid artery disease ([place]) History of carotid artery disease status post right carotid stenting by myself 10/10/2017.  She did have 70% bilateral ICA stenosis by angiography which I performed 11/15/2015.  Most recent Dopplers performed 03/07/2020 revealed a widely patent right carotid stent with moderate left ICA stenosis.  Dopplers will be repeated on annual basis.  Ischemic cardiomyopathy History of ischemic cardiomyopathy echo performed 2/618/21 revealing EF in the  20 to 25% range.  She is on optimal medical therapy followed by Dr. Aundra Dubin.  She recently had an ICD placed for primary prevention by Dr. Quentin Ore 07/15/2020.  Hyperlipidemia associated with type 2 diabetes mellitus ([place]) History of hyperlipidemia on statin therapy with lipid profile performed 08/19/2020 revealing total cholesterol of 103, LDL of 45 and HDL 27.  TOBACCO USE History of ongoing tobacco abuse of 1 pack/day recalcitrant to risk factor modification.  Hx of CABG History of CAD status post coronary artery bypass grafting March 2007 by Dr. [name].  She has had angiographically documented occluded vein grafts with a patent LIMA pulmonary hypertension.  Her last cath performed by Dr. Martinique in 2016 revealed poor targets for redo CABG.  She gets occasional although fairly infrequent chest pain.  Atherosclerotic vascular disease History of peripheral vascular occlusive disease status post right SFA directional atherectomy followed by drug-coated balloon angioplasty by myself 04/18/2020.  She had high-grade segmental disease in the left SFA as well with three-vessel runoff bilaterally.  Unfortunately, this did not improve her claudication symptoms.  Her Dopplers performed 11/03/2020 revealed a right ABI of 0.93 with a patent right SFA with moderate restenosis within the proximal portion and a left ABI of 0.71.      Lorretta Harp MD FACP,FACC,FAHA, [facility] 12/06/2020 3:53 PM

## 2020-12-17 ENCOUNTER — Other Ambulatory Visit: Payer: Self-pay | Admitting: Cardiology

## 2020-12-20 ENCOUNTER — Other Ambulatory Visit (HOSPITAL_BASED_OUTPATIENT_CLINIC_OR_DEPARTMENT_OTHER): Payer: Self-pay

## 2020-12-24 NOTE — Progress Notes (Incomplete)
***In Progress*** PCP: Tower, [name], MD Cardiology: Dr. Martinique HF Cardiology: Dr. Aundra Dubin  HPI:  65 y.o. with history of CAD, ischemic cardiomyopathy, CVA, and valvular disease who was referred by Dr. Martinique for CHF evaluation. Patient has an extensive history of vascular disease. She had a CABG in 2007. Repeat LHC in 2007 showed all grafts closed except LIMA-LAD. Patient had DES to LCx/OM. LHC in 2016 showed occlusion of DES to LCx/OM with L-L and L-R collaterals. Poor targets for redo CABG. She additionally had the placement of a right carotid stent in 10/2017. In 11/2019, echo was done showing EF 20-25% with moderate RV dysfunction, moderate-severe MR, moderate TR. Despite smoking history, PFTs in 2019 looked relatively normal.   RHC was done in 12/2019 showing near-normal filling pressures with CI 2.12 by Fick. CPX in 01/2020 showed moderate HF limitation with peak VO2 13, VE/VCO2 slope 37. The PFTs on the CPX were not markedly abnormal. She saw Dr. Gwenlyn Found recently, peripheral arterial dopplers in 12/2019 showed significant stenosis right SFA.   She was worked up for LVAD. She was presented at [facility] and saw Dr. Prescott Gum. He was concerned that her small stature and small LV cavity. This was thought to not allow Heartmate 3 to function well in her small LV, and Heartware LVAD that was thought to be more suitable for small patients had been discontinued.   She was considered for a barostimulation trial, but she did not qualify for either ANTHEM or Batwire due to carotid disease.   She has been off dapagliflozin due to recurrrent GU yeast infections.  In 03/2020, she had atherectomy of right SFA.  Scofield placed in 07/2020.   As she was turned down for LVAD at [facility], she was seen at [facility]. She underwent workup there with echo in 08/2020 showing higher EF 35%. RHC, however, showed low CI 1.8.    She recently returned for followup of CHF on 11/17/20 with Dr. Aundra Dubin. She was still  smoking but was trying to cut back. She still had right calf and thigh cramps after walking about 100 feet (to mailbox). She also had right buttock area pain that may be claudication. Weight was up 3 lbs. Short of breath walking up stairs/inclines. She was taking care of a friend who was homebound which kept her active. She had rare atypical chest pain. No lightheadedness.  Today he returns to HF clinic for pharmacist medication titration. At last visit with MD, vericiguat was increased to 5 mg daily.   114/60, 82, 126 lb - No bmet needed - Hx hypotension > no BB,Entresto for now per DM; no SGLT2 with GU issues A) Increase vericiguat? F/u 02/17/21 echo + OV with DM  Overall feeling ***. Dizziness, lightheadedness, fatigue:  Chest pain or palpitations:  How is your breathing?: *** SOB: Able to complete all ADLs. Activity level ***  Weight at home pounds. Takes furosemide/torsemide/bumex *** mg *** daily.  LEE PND/Orthopnea  Appetite *** Low-salt diet:   Physical Exam Cost/affordability of meds   HF Medications: Spironolactone 25 mg daily Digoxin 0.0625 mg daily Vericiguat 5 mg daily Torsemide 60 mg QAM / 40 mg QPM Metolazone 2.5 mg BID on Monday/Friday  Has the patient been experiencing any side effects to the medications prescribed?  {YES NO:22349}  Does the patient have any problems obtaining medications due to transportation or finances?   {YES NO:22349} - Has [employer] Medicare and [place] Medicaid  Understanding of regimen: {excellent/good/fair/poor:19665} Understanding of indications: {excellent/good/fair/poor:19665}  Potential of compliance: {excellent/good/fair/poor:19665} Patient understands to avoid NSAIDs. Patient understands to avoid decongestants.    Pertinent Lab Values (11/17/20): [name] Serum creatinine 0.81, BUN 11, Potassium 4.9, Sodium 138, Digoxin 0.7   Vital Signs: . Weight: *** (last clinic weight: 126 lbs) . Blood pressure: ***  . Heart rate: ***    Assessment/Plan: 1. Chronic systolic CHF: Ischemic cardiomyopathy, [employer] ICD.  Echo in 11/2019 showed EF 20-25%, moderately dilated and moderately dysfunctional RV, PASP 49, severe biatrial enlargement, moderate-severe MR, severe TR, mild-moderate AS, IVC dilated. CPX (01/2020) with moderate HF limitation, RHC (12/2019) with CI low at 2.12.  Medication titration has been severely limited by hypotension and orthostatic symptoms. Not a transplant candidate due to smoking. She was seen by cardiac surgery at [facility] and not thought to be good LVAD candidate due to small stature, small LV cavity, and redo sternotomy. She was sent to [facility] for a 2nd opinion.  She had echo there in 08/2020 with EF higher at 35% but CI low on RHC at 1.8.  - NYHA class III symptoms, stable. She is not volume overloaded on exam.  - Continue torsemide 60 mg QAM/40 mg QPM - Continue metolazone 2.5 mg BID on Monday/Friday - Continue spironolactone 25 mg daily - Continue digoxin 0.0625 mg daily - Continue vericiguat 5 mg daily - She has not tolerated BP-active meds well. Avoid carvedilol and Entresto for now. She became hypotensive with losartan.  - She did not tolerate SGLT2 inhibitor due to recurrent GU yeast infections.   - She does not qualify for the barostimulation trials due to carotid disease.  - Follow-up *** - With higher EF on last echo, she was not a candidate for LVAD.  Plans noted to repeat echo at followup in 3 months.   2. CAD: s/p CABG 4259 complicated by early graft closure.  Repeat cath in 2007 showed only LIMA-LAD still open and she had DES to LCX/OM.  Cath in 2016 showed this stent was occluded.  She has not had targets for redo CABG and she does not have good interventional targets. She has occasional atypical chest pain. - Continue aspirin 81 mg daily, Plavix 75 mg daily.  - Continue ranolazone 1000 bid.   - Continue rosuvastatin 20 mg daily and ezetimibe 10 mg daily. Good lipids in 08/2020.    3. Smoking: Still smoking 3/4 ppd.  She is trying to quit, does not want pharmacological assistance.     4. PAD: s/p atherectomy/angioplasty right SFA in 03/2020.  Still with significant claudication. Peripheral arterial dopplers in 11/2020 with 50-74% right SFA stenosis.  - Follows with Dr. Gwenlyn Found for PV.   5. Carotid stenosis: Moderate stenosis, repeat dopplers 03/2021.    6. Fe deficiency anemia: Has been FOBT negative.  Has had Feraheme.  Most recent hgb looks stable.   Richardine Service, PharmD, [place] PGY2 Cardiology Pharmacy Resident  Audry Riles, PharmD, BCPS, [facility], CPP Heart Failure Clinic Pharmacist [phone]

## 2020-12-25 ENCOUNTER — Other Ambulatory Visit: Payer: Self-pay | Admitting: Pulmonary Disease

## 2020-12-25 DIAGNOSIS — J9611 Chronic respiratory failure with hypoxia: Secondary | ICD-10-CM

## 2020-12-26 ENCOUNTER — Ambulatory Visit (HOSPITAL_COMMUNITY)
Admission: RE | Admit: 2020-12-26 | Discharge: 2020-12-26 | Disposition: A | Discharge status: Home / Self Care | Payer: Medicare Other | Source: Ambulatory Visit | Attending: Internal Medicine | Admitting: Internal Medicine

## 2020-12-26 ENCOUNTER — Other Ambulatory Visit: Payer: Self-pay

## 2020-12-26 ENCOUNTER — Other Ambulatory Visit (HOSPITAL_COMMUNITY): Payer: Self-pay | Admitting: Cardiology

## 2020-12-26 VITALS — BP 118/64 | HR 88 | Wt 126.8 lb

## 2020-12-26 DIAGNOSIS — I739 Peripheral vascular disease, unspecified: Secondary | ICD-10-CM | POA: Insufficient documentation

## 2020-12-26 DIAGNOSIS — I5022 Chronic systolic (congestive) heart failure: Secondary | ICD-10-CM

## 2020-12-26 DIAGNOSIS — R0789 Other chest pain: Secondary | ICD-10-CM | POA: Insufficient documentation

## 2020-12-26 DIAGNOSIS — Z7982 Long term (current) use of aspirin: Secondary | ICD-10-CM | POA: Diagnosis not present

## 2020-12-26 DIAGNOSIS — F17210 Nicotine dependence, cigarettes, uncomplicated: Secondary | ICD-10-CM | POA: Insufficient documentation

## 2020-12-26 DIAGNOSIS — D539 Nutritional anemia, unspecified: Secondary | ICD-10-CM | POA: Diagnosis not present

## 2020-12-26 DIAGNOSIS — R42 Dizziness and giddiness: Secondary | ICD-10-CM | POA: Insufficient documentation

## 2020-12-26 DIAGNOSIS — I2510 Atherosclerotic heart disease of native coronary artery without angina pectoris: Secondary | ICD-10-CM | POA: Diagnosis not present

## 2020-12-26 DIAGNOSIS — R200 Anesthesia of skin: Secondary | ICD-10-CM | POA: Insufficient documentation

## 2020-12-26 DIAGNOSIS — I255 Ischemic cardiomyopathy: Secondary | ICD-10-CM | POA: Insufficient documentation

## 2020-12-26 DIAGNOSIS — Z951 Presence of aortocoronary bypass graft: Secondary | ICD-10-CM | POA: Diagnosis not present

## 2020-12-26 DIAGNOSIS — R5383 Other fatigue: Secondary | ICD-10-CM | POA: Insufficient documentation

## 2020-12-26 DIAGNOSIS — Z79899 Other long term (current) drug therapy: Secondary | ICD-10-CM | POA: Insufficient documentation

## 2020-12-26 DIAGNOSIS — I6529 Occlusion and stenosis of unspecified carotid artery: Secondary | ICD-10-CM | POA: Diagnosis not present

## 2020-12-26 MED ORDER — VERICIGUAT 10 MG PO TABS: 10.0000 mg | ORAL_TABLET | Freq: Every day | ORAL | 3 refills | Status: DC

## 2020-12-26 MED FILL — VERQUVO 10 MG TABS: 10 | 90 days supply | Qty: 90 | Fill #0

## 2020-12-26 NOTE — Progress Notes (Signed)
PCP: [name], MD Cardiology: Dr. Martinique HF Cardiology: Dr. Aundra Dubin  HPI:  65 y.o. with history of CAD, ischemic cardiomyopathy, CVA, and valvular disease who was referred by Dr. Martinique for CHF evaluation. Patient has an extensive history of vascular disease. She had a CABG in 2007. Repeat LHC in 2007 showed all grafts closed except LIMA-LAD. Patient had DES to LCx/OM. LHC in 2016 showed occlusion of DES to LCx/OM with L-L and L-R collaterals. Poor targets for redo CABG. She additionally had the placement of a right carotid stent in 10/2017. In 11/2019, echo was done showing EF 20-25% with moderate RV dysfunction, moderate-severe MR, moderate TR. Despite smoking history, PFTs in 2019 looked relatively normal.   RHC was done in 12/2019 showing near-normal filling pressures with CI 2.12 by Fick. CPX in 01/2020 showed moderate HF limitation with peak VO2 13, VE/VCO2 slope 37. The PFTs on the CPX were not markedly abnormal. She saw Dr. Gwenlyn Found recently, peripheral arterial dopplers in 12/2019 showed significant stenosis right SFA.   She was worked up for LVAD. She was presented at [facility] and saw Dr. Prescott Gum. He was concerned that her small stature and small LV cavity. This was thought to not allow Heartmate 3 to function well in her small LV, and Heartware LVAD that was thought to be more suitable for small patients had been discontinued.   She was considered for a barostimulation trial, but she did not qualify for either ANTHEM or Batwire due to carotid disease.   She has been off dapagliflozin due to recurrrent GU yeast infections.  In 03/2020, she had atherectomy of right SFA.  Lyons placed in 07/2020.   As she was turned down for LVAD at [facility], she was seen at [facility]. She underwent workup there with echo in 08/2020 showing higher EF 35%. RHC, however, showed low CI 1.8.    She recently returned for followup of CHF on 11/17/20 with Dr. Aundra Dubin. She was still smoking but was  trying to cut back. She still had right calf and thigh cramps after walking about 100 feet (to mailbox). She also had right buttock area pain that may be claudication. Weight was up 3 lbs. Short of breath walking up stairs/inclines. She was taking care of a friend who was homebound which kept her active. She had rare atypical chest pain. No lightheadedness.  Today she returns to HF clinic for pharmacist medication titration. At last visit with MD, vericiguat was increased to 5 mg daily. Overall she is feeling good. Has chronic dizziness/lightheadedness. Has increased fatigue but attributes this to not being able to sleep due to seasonal allergies. Having more numbness in fingers and is followed by Dr. Gwenlyn Found for PVD. Breathing is stable and she uses a walker. Weight stable at home ~124 lbs, no LEE. Chronic and stable orthopnea, denies PND. Appetite is fine, following low-salt diet. Taking all medications and tolerating them well.  HF Medications: Spironolactone 25 mg daily Digoxin 0.0625 mg daily Vericiguat 5 mg daily Torsemide 60 mg QAM / 40 mg QPM Metolazone 2.5 mg twice weekly on Monday/Friday KCl 20 mEq BID  Has the patient been experiencing any side effects to the medications prescribed?  no  Does the patient have any problems obtaining medications due to transportation or finances?   no - Has [employer] Medicare and [place] Medicaid  Understanding of regimen: good Understanding of indications: good Potential of compliance: good Patient understands to avoid NSAIDs. Patient understands to avoid decongestants.  Pertinent Lab Values (11/17/20): [name] Serum creatinine 0.81, BUN 11, Potassium 4.9, Sodium 138, Digoxin 0.7   Vital Signs: . Weight: 126.8 lbs (last clinic weight: 126 lbs) . Blood pressure: 118/64  . Heart rate: 88   Assessment/Plan: 1. Chronic systolic CHF: Ischemic cardiomyopathy, [employer] ICD.  Echo in 11/2019 showed EF 20-25%, moderately dilated and moderately  dysfunctional RV, PASP 49, severe biatrial enlargement, moderate-severe MR, severe TR, mild-moderate AS, IVC dilated. CPX (01/2020) with moderate HF limitation, RHC (12/2019) with CI low at 2.12.  Medication titration has been severely limited by hypotension and orthostatic symptoms. Not a transplant candidate due to smoking. She was seen by cardiac surgery at [facility] and not thought to be good LVAD candidate due to small stature, small LV cavity, and redo sternotomy. She was sent to [facility] for a 2nd opinion.  She had echo there in 08/2020 with EF higher at 35% but CI low on RHC at 1.8.  - NYHA class III symptoms, stable. She is not volume overloaded on exam.  - Continue torsemide 60 mg QAM/40 mg QPM - Continue metolazone 2.5 mg twice weekly on Monday/Friday - Continue KCl 20 mEq BID - Continue spironolactone 25 mg daily - Continue digoxin 0.0625 mg daily - Increase vericiguat to 10 mg daily - She has not tolerated BP-active meds well. Avoid carvedilol and Entresto for now. She became hypotensive with losartan.  - She did not tolerate SGLT2 inhibitor due to recurrent GU yeast infections.   - She does not qualify for the barostimulation trials due to carotid disease.  - With higher EF on last echo, she was not a candidate for LVAD.   - Follow-up ECHO and office visit in 2 months with Dr. Aundra Dubin  2. CAD: s/p CABG 8453 complicated by early graft closure.  Repeat cath in 2007 showed only LIMA-LAD still open and she had DES to LCX/OM.  Cath in 2016 showed this stent was occluded.  She has not had targets for redo CABG and she does not have good interventional targets. She has occasional atypical chest pain. - Continue aspirin 81 mg daily, clopidogrel 75 mg daily.  - Continue ranolazine 1000 mg BID.   - Continue rosuvastatin 20 mg daily and ezetimibe 10 mg daily. Good lipids in 08/2020.   3. Smoking: Still smoking 3/4 ppd.  She is trying to quit, uses nicotine gum PRN.   4. PAD: s/p  atherectomy/angioplasty right SFA in 03/2020.  Still with significant claudication. Peripheral arterial dopplers in 11/2020 with 50-74% right SFA stenosis.  - Follows with Dr. Gwenlyn Found for PVD.   5. Carotid stenosis: Moderate stenosis, repeat dopplers 03/2021.    6. Fe deficiency anemia: Has been FOBT negative.  Has had Feraheme.  Most recent hgb looks stable.   Richardine Service, PharmD, [place] PGY2 Cardiology Pharmacy Resident  Audry Riles, PharmD, BCPS, [facility], CPP Heart Failure Clinic Pharmacist [phone]

## 2020-12-26 NOTE — Patient Instructions (Signed)
It was a pleasure seeing you today!  MEDICATIONS: -We are changing your medications today -Increase Verquvo to 10 mg daily. You may take 2 tablets of the 5 mg strength daily until you receive the new strength.  -Call if you have questions about your medications.   NEXT APPOINTMENT: Return to clinic in 2 months with Dr. Aundra Dubin.  In general, to take care of your heart failure: -Limit your fluid intake to 2 Liters (half-gallon) per day.   -Limit your salt intake to ideally 2-3 grams (2000-3000 mg) per day. -Weigh yourself daily and record, and bring that "weight diary" to your next appointment.  (Weight gain of 2-3 pounds in 1 day typically means fluid weight.) -The medications for your heart are to help your heart and help you live longer.   -Please contact us before stopping any of your heart medications.  Call the clinic at [phone] with questions or to reschedule future appointments.

## 2021-01-03 ENCOUNTER — Telehealth: Payer: Self-pay | Admitting: Internal Medicine

## 2021-01-03 NOTE — Telephone Encounter (Signed)
Rescheduled upcoming appointment per 4/5 schedule message. Patient is aware of changes.

## 2021-01-05 ENCOUNTER — Ambulatory Visit: Payer: Medicare Other | Admitting: Cardiology

## 2021-01-05 ENCOUNTER — Ambulatory Visit: Payer: Medicare Other | Admitting: Internal Medicine

## 2021-01-05 ENCOUNTER — Other Ambulatory Visit: Payer: Medicare Other

## 2021-01-05 NOTE — Progress Notes (Signed)
Diane [name] Date of Birth: 1955/12/09 Medical Record #[id number]  History of Present Illness: Diane [name] is seen for follow up CAD. She has a history of coronary disease and is status post CABG in March of 2007 by Dr. [name]. She presented later that year with recurrent angina. Repeat cardiac cath showed patent LIMA to the LAD but all other grafts occluded including SVG to OM, SVG to AC/PL, and radial graft to ramus intermediate. She then had complex stenting of the mid LCx and first OM with Taxus stents. The native RCA was occluded with collaterals.   In 2016 she was evaluated for abdominal surgery. Myoview abnormal. Cardiac cath as noted above but now stents in LCx and OM occluded. No suitable targets for PCI or surgery. Severe LV dysfunction with moderate pulmonary HTN and normal LV filling pressures. She has multiple cardiac risk factors including diabetes, dyslipidemia, hypertension, and tobacco abuse. She also has  bilateral carotid arterial disease. She continues to smoke.   In August 2016 she had removal of a large pelvic mass with BSO and sigmoid colectomy. Path c/w ovarian cystadenoma. No complications.   She is followed by Dr. Gwenlyn Found for her carotid disease. Angiogram performed with findings of 70% bilateral stenoses. The LICA was heavily calcified. Both arteries had acute angulation after the stenosis making it less favorable for stenting. In March she developed dizziness, lightheadedness, and imbalance. Dr. Glori Bickers ordered an MRI that showed an old right MCA infarct and chronic lacunar infarcts that were new from 2014.   On a prior visit with me we switched her losartan to [facility]. She developed a persistent cough with clear phlegm. [name] was stopped and losartan resumed- cough resolved.  CXR showed no active disease.   She previously complained of pain in her thighs bilaterally. LE dopplers shows good ABIs of 0.93 bilaterally. Mild right iliac stenosis. Moderate right femoral and  SFA disease. Seen by Dr. Gwenlyn Found and medical therapy recommended. This past fall carotid dopplers showed progression of right carotid disease. She was not a candidate for CEA so underwent right carotid stenting with good results.   She reports she had a bad fall in 2019 while standing in a [place] shop. Legs just gave way and she fell.  She reports now that she is feeling progressively weaker and more unsteady. Has to use a walker now. Legs get weak and she gives out walking across her house. Has occasional chest pain that has not changed. Occasional heart skipping. She is chronically SOB. No edema. Last A1c 6.7 %. Feels cold. She was  diagnosed with anemia. Hgb 9.9. FOBT positive. She did have upper EGD without acute findings.  In June  was noted to be hypotensive. Coreg dose was reduced. Earlier this year seen again with profound hypotension leading to discontinuation of losartan and Coreg. She did develop some volume excess and metolazone was added to BID lasix. Echo repeated and showed EF 20-25%, mod-severe MR and severe TR with estimated PA pressure 49 mm Hg. Mild to moderate AS. She is receiving iron infusions for iron deficiency anemia.   She was referred to the Advanced heart failure clinic and saw Dr Aundra Dubin Class IIIb symptoms. Orthostatic symptoms limited ability to titrate medication. He was concerned about low output. She was started on digoxin. Lasix was switched to torsemide with use of metolazone PRN. She did undergo RHC and CPX with results noted below. Not felt to be a candidate for LVAD. She did undergo ICD implant by Dr Quentin Ore  She was evaluated by Dr Gwenlyn Found and underwent peripheral intervention on her 04/18/2020. She had [facility] one directional atherectomy followed by drug-coated balloon angioplasty of high-grade segmental proximal right SFA stenosis with three-vessel runoff. She had excellent angiographic result. Her Dopplers performed on 04/19/2020 showed residual mild increased frequency  in her proximal right SFA with ABIs increased from 0.76-0.89. She really did not get a lot of symptomatic benefit with this. She does continue to smoke 1 pack/day.  She was seen at [facility]. She underwent workup there with echo in 11/21 showing higher EF 35%. RHC, however, showed low CI 1.8. Not a candidate for transplant due to smoking. Medications have been titrated by Dr Aundra Dubin. Intolerant of SGLT 2 inhibitor due to recurrent yeast infection. Medications limited due to hypotension. On Digoxin, aldactone and torsemide. Added vericiguat. Plans to repeat Echo.   She reports she is doing OK. Keeps a positive attitude and has faith. Some swelling at times. Breathing is OK. Seldom gets any chest pain. Legs stay tired all the time. She did lose her balance and fell last week. She is still smoking 1 ppd.    Current Outpatient Medications on File Prior to Visit  Medication Sig Dispense Refill  . acetaminophen (TYLENOL) 500 MG tablet Take 1,000 mg by mouth every 6 (six) hours as needed for moderate pain or headache.    . albuterol (VENTOLIN HFA) 108 (90 Base) MCG/ACT inhaler INHALE 2 PUFFS EVERY 6 HOURS AS NEEDED FOR WHEEZING/SHORTNESS OF BREATH 18 each 0  . aspirin EC 81 MG tablet Take 81 mg by mouth every evening.  30 tablet 6  . DEXILANT 60 MG capsule TAKE 1 CAPSULE DAILY 90 capsule 1  . digoxin (LANOXIN) 0.125 MG tablet TAKE 0.5 TABLETS (0.0625 MG TOTAL) BY MOUTH DAILY. 45 tablet 3  . diphenhydrAMINE (BENADRYL) 25 mg capsule Take 25 mg by mouth 2 (two) times daily as needed for itching or allergies.     [name] ezetimibe (ZETIA) 10 MG tablet TAKE 1 TABLET BY MOUTH EVERY DAY 90 tablet 2  . fluticasone (FLONASE) 50 MCG/ACT nasal spray Place 2 sprays into both nostrils daily. 48 g 1  . gabapentin (NEURONTIN) 300 MG capsule TAKE 2 CAPSULES BY MOUTH EVERY DAY AT BEDTIME 180 capsule 1  . Glycopyrrolate-Formoterol (BEVESPI AEROSPHERE) 9-4.8 MCG/ACT AERO Inhale 2 puffs into the lungs 2 (two) times daily. 32.1 g 1   . guaiFENesin (MUCINEX) 600 MG 12 hr tablet Take 2 tablets (1,200 mg total) by mouth 2 (two) times daily. 360 tablet 3  . guaiFENesin-codeine (ROBITUSSIN AC) 100-10 MG/5ML syrup Take 5 mLs by mouth 3 (three) times daily as needed for cough. 120 mL 0  . HYDROcodone-acetaminophen (NORCO) 5-325 MG tablet Take 1 tablet by mouth daily as needed for severe pain. 15 tablet 0  . insulin aspart (NOVOLOG) 100 UNIT/ML injection Inject 45 Units into the skin in the morning and at bedtime.    . iron polysaccharides (NIFEREX) 150 MG capsule Take 150 mg by mouth. 3 times per week    . KLOR-CON M20 20 MEQ tablet TAKE 1 TABLET TWICE A DAY 180 tablet 0  . levETIRAcetam (KEPPRA) 1000 MG tablet Take 1 tablet (1,000 mg total) by mouth 2 (two) times daily. 180 tablet 3  . metFORMIN (GLUCOPHAGE-XR) 500 MG 24 hr tablet Take 500 mg by mouth every evening.   6  . metolazone (ZAROXOLYN) 2.5 MG tablet Take 1 tablet (2.5 mg total) by mouth 2 (two) times a week. Every Mon and Fri  26 tablet 3  . nicotine polacrilex (NICORETTE) 4 MG gum Take 1 each (4 mg total) by mouth as needed for smoking cessation. 90 tablet 2  . nitroGLYCERIN (NITROLINGUAL) 0.4 MG/SPRAY spray Place 1 spray under the tongue every 5 (five) minutes x 3 doses as needed for chest pain. 12 g 3  . NYSTATIN powder APPLY 1 APPLICATION        TOPICALLY 4 TIMES A DAY AS NEEDED FOR YEAST INFECTIONS 60 g 3  . ONE TOUCH ULTRA TEST test strip USE AS DIRECTED FOR TESTING BLOOD GLUCOSE 3 TIMES DAILY  1  . polyethylene glycol (MIRALAX / GLYCOLAX) 17 g packet Take 17 g by mouth daily.    . promethazine (PHENERGAN) 25 MG tablet Take 1 tablet (25 mg total) by mouth every 8 (eight) hours as needed for nausea or vomiting. Caution of sedation 30 tablet 1  . rosuvastatin (CRESTOR) 20 MG tablet Take 1 tablet (20 mg total) by mouth daily. 30 tablet 6  . sodium chloride (OCEAN) 0.65 % SOLN nasal spray Place 1 spray into both nostrils as needed for congestion.    [name] spironolactone  (ALDACTONE) 25 MG tablet Take 1 tablet (25 mg total) by mouth at bedtime. 90 tablet 3  . torsemide (DEMADEX) 20 MG tablet TAKE 3 TABLETS (60 MG TOTAL) BY MOUTH EVERY MORNING AND 2 TABLETS (40 MG TOTAL) EVERY EVENING. 450 tablet 3  . VASCEPA 1 g capsule TAKE 2 CAPSULES (2 G TOTAL) BY MOUTH 2 (TWO) TIMES DAILY. 120 capsule 3  . Vericiguat 10 MG TABS TAKE 1 TABLET BY MOUTH ONCE DAILY 90 tablet 3   No current facility-administered medications on file prior to visit.    Allergies  Allergen Reactions  . Bee Venom Itching and Swelling  . Benadryl [Diphenhydramine] Other (See Comments)    Insomnia   . Entresto [Sacubitril-Valsartan] Cough  . Farxiga [Dapagliflozin]     Yeast infection  . Tetracycline     Unknown, pt cannot recall exact reaction  . Ace Inhibitors Cough    Past Medical History:  Diagnosis Date  . Anemia   . Carotid stenosis    a. s/p right carotid stent 10/2017.  [name] Chronic systolic CHF (congestive heart failure) ([place])   . Colon polyps   . COPD (chronic obstructive pulmonary disease) ([place])   . Coronary artery disease    post CABG in 3/07 , coronary stents   . Diabetes mellitus    type 2  . Dyslipidemia   . Fibromyalgia   . GERD (gastroesophageal reflux disease)   . Headache    hx of   . Hyperlipidemia   . Hypertension   . Myocardial infarction ([place])   . Pneumonia    hx of   . Seizures ([place])    last seizure- 03/2013   . Shortness of breath dyspnea    with exertion or when fluid builds up   . Sleep apnea    used to wear a cpap- not used in 3 years   . Status post dilation of esophageal narrowing   . Stroke [facility]) 1993   problems with balance   . Systolic murmur    known mild AS and MR  . Thyroid goiter   . Tobacco abuse     Past Surgical History:  Procedure Laterality Date  . ABDOMINAL AORTOGRAM W/LOWER EXTREMITY N/A 04/18/2020   Procedure: ABDOMINAL AORTOGRAM W/LOWER EXTREMITY;  Surgeon: Lorretta Harp, MD;  Location: [place] [facility];  Service:  Cardiovascular;  Laterality: N/A;  . APPENDECTOMY    . BIOPSY  02/03/2019   Procedure: BIOPSY;  Surgeon: Ladene Artist, MD;  Location: [facility];  Service: Endoscopy;;  . BREAST BIOPSY Left 2016  . CARDIAC CATHETERIZATION  11/29/05   EF of 55%  . CARDIAC CATHETERIZATION  08/06/06   EF of 45-50%  . CARDIAC CATHETERIZATION N/A 05/06/2015   Procedure: Right/Left Heart Cath and Coronary/Graft Angiography;  Surgeon: Channah Godeaux M Martinique, MD;  Location: [place] [facility];  Service: Cardiovascular;  Laterality: N/A;  . CAROTID PTA/STENT INTERVENTION N/A 10/10/2017   Procedure: CAROTID PTA/STENT INTERVENTION - Right;  Surgeon: Serafina Mitchell, MD;  Location: [place] [facility];  Service: Cardiovascular;  Laterality: N/A;  . CERVICAL FUSION  1990  . CHOLECYSTECTOMY    . COLON RESECTION     mass removed and 4 in of colon  . COLONOSCOPY WITH PROPOFOL N/A 07/16/2017   Procedure: COLONOSCOPY WITH PROPOFOL;  Surgeon: Ladene Artist, MD;  Location: [facility];  Service: Endoscopy;  Laterality: N/A;  . CORONARY ARTERY BYPASS GRAFT  12/04/2005   x5 -- left internal mammary artery to the LAD, left radial artery to the ramus intermedius, saphenous vein graft to the obtuse marginal 1, sequential saphenous vein grat to the acute marginal and posterior descending, endoscopic vein harvesting from the left thigh with open vein harvest from right leg  . CORONARY STENT PLACEMENT  08/11/06   PCI of her ciurcumflex/OM vessel  . ESOPHAGOGASTRODUODENOSCOPY (EGD) WITH PROPOFOL N/A 02/03/2019   Procedure: ESOPHAGOGASTRODUODENOSCOPY (EGD) WITH PROPOFOL;  Surgeon: Ladene Artist, MD;  Location: [facility];  Service: Endoscopy;  Laterality: N/A;  . ICD IMPLANT N/A 07/15/2020   Procedure: ICD IMPLANT;  Surgeon: Vickie Epley, MD;  Location: [place] [facility];  Service: Cardiovascular;  Laterality: N/A;  . LAPAROTOMY Bilateral 05/19/2015   Procedure: EXPLORATORY LAPAROTOMY WITH BILATERAL SALPINGO OOPHORECTOMY  /OMENTECTOMY/SEGMENTAL SIGMOID COLECTOMY ;  Surgeon: Everitt Amber, MD;  Location: [facility];  Service: Gynecology;  Laterality: Bilateral;  . PERIPHERAL VASCULAR BALLOON ANGIOPLASTY  04/18/2020   Procedure: PERIPHERAL VASCULAR BALLOON ANGIOPLASTY;  Surgeon: Lorretta Harp, MD;  Location: [place] [facility];  Service: Cardiovascular;;  rt SFA  atherectomy and DCB  . PERIPHERAL VASCULAR CATHETERIZATION N/A 11/15/2015   Procedure: Carotid PTA/Stent Intervention;  Surgeon: Lorretta Harp, MD;  Location: [place] [facility];  Service: Cardiovascular;  Laterality: N/A;  . PERIPHERAL VASCULAR CATHETERIZATION  11/15/2015   Procedure: Carotid Angiography;  Surgeon: Lorretta Harp, MD;  Location: [place] [facility];  Service: Cardiovascular;;  . RIGHT HEART CATH N/A 01/27/2020   Procedure: RIGHT HEART CATH;  Surgeon: Larey Dresser, MD;  Location: [place] [facility];  Service: Cardiovascular;  Laterality: N/A;    Social History   Tobacco Use  Smoking Status Current Every Day Smoker  . Packs/day: 1.00  . Years: 51.00  . Pack years: 51.00  . Types: Cigarettes  . Start date: 6  . Last attempt to quit: 02/22/2020  . Years since quitting: 0.8  Smokeless Tobacco Never Used    Social History   Substance and Sexual Activity  Alcohol Use No    Family History  Problem Relation Age of Onset  . Heart disease Mother   . Diabetes Mother   . COPD Mother   . Hyperlipidemia Mother   . Hypertension Mother   . Cancer Father        met, origin unknown  . Heart attack Father   .  Drug abuse Paternal Grandmother   . Stroke Paternal Grandfather   . Lung cancer Paternal Aunt        lung with mets to brain  . Melanoma Paternal Uncle   . Lung cancer Paternal Uncle        lung/liver to brain  . Cancer Paternal Uncle        cancer of unknown type  . Heart disease Maternal Grandfather   . Hypertension Sister   . Cancer Sister        eyelid  . Glaucoma Sister   . Heart disease Maternal Aunt        x  2 aunts    Review of Systems: As noted in history of present illness.  All other systems were reviewed and are negative.  Physical Exam: BP 120/72 (BP Location: Right Arm, Patient Position: Sitting, Cuff Size: Normal)   Pulse 88   Ht '4\' 10"'  (1.473 m)   Wt 126 lb 3.2 oz (57.2 kg)   LMP 09/02/2003   SpO2 95%   BMI 26.38 kg/m  GENERAL:  Well appearing WF in NAD HEENT:  PERRL, EOMI, sclera are clear. Oropharynx is clear. NECK:  No jugular venous distention, bilateral carotid bruits L>R, no thyromegaly or adenopathy LUNGS:  Clear to auscultation bilaterally CHEST:  Unremarkable HEART:  RRR,  PMI not displaced or sustained,S1 and S2 within normal limits, no S3, no S4: no clicks, no rubs, gr 2/6 systolic murmur RUSB.  ABD:  Soft, nontender. BS +, no masses or bruits. No hepatomegaly, no splenomegaly EXT:  1+ pulses throughout, no edema, no cyanosis no clubbing SKIN:  Warm and dry.  No rashes NEURO:  Alert and oriented x 3. Cranial nerves II through XII intact. PSYCH:  Cognitively intact    LABORATORY DATA: Lab Results  Component Value Date   WBC 7.4 07/07/2020   HGB 14.7 07/07/2020   HCT 43.2 07/07/2020   PLT 222 07/07/2020   GLUCOSE 208 (H) 11/17/2020   CHOL 103 08/19/2020   TRIG 155 (H) 08/19/2020   HDL 27 (L) 08/19/2020   LDLDIRECT 76.0 04/11/2017   LDLCALC 45 08/19/2020   ALT 14 07/07/2020   AST 14 (L) 07/07/2020   NA 138 11/17/2020   K 4.9 11/17/2020   CL 101 11/17/2020   CREATININE 0.81 11/17/2020   BUN 11 11/17/2020   CO2 28 11/17/2020   TSH 1.69 05/31/2020   INR 1.0 02/24/2020   HGBA1C 8.6 (H) 02/24/2020   MICROALBUR 1.9 06/09/2013    Echo 11/17/19: IMPRESSIONS    1. Left ventricular ejection fraction, by estimation, is 20 to 25%. Left  ventricular ejection fraction by 3D volume is 23 %. The left ventricle has  severely decreased function. The left ventricle demonstrates global  hypokinesis. Left ventricular  diastolic parameters are consistent with  Grade II diastolic dysfunction  (pseudonormalization). Elevated left atrial pressure.  2. Right ventricular systolic function is moderately reduced. The right  ventricular size is moderately enlarged. There is moderately elevated  pulmonary artery systolic pressure. The estimated right ventricular  systolic pressure is 56.3 mmHg.  3. Left atrial size was severely dilated.  4. Right atrial size was severely dilated.  5. The mitral valve is degenerative. Moderate to severe mitral valve  regurgitation. No evidence of mitral stenosis.  6. Tricuspid valve regurgitation is severe.  7. Mild to moderate aortic stenosis. V max 2.2 m/s, mean gradient 10.5  mmHG. Gradients lower due to lower stroke volume (SV=38 cc; SVi=24 cc/m2).  The  aortic valve is tricuspid. Aortic valve regurgitation is not  visualized.  8. There is mild dilatation of the ascending aorta measuring 41 mm.  9. The inferior vena cava is dilated in size with <50% respiratory  variability, suggesting right atrial pressure of 15 mmHg.   Comparison(s): A prior study was performed on 04/25/2015. Prior images  reviewed side by side. Changes from prior study are noted. EF now 20-25%.  Severe TR. Moderate to severe MR present. RV moderately enlarged with  reduced function.   Right heart cath 01/27/20: Conclusion  1.  Filling pressures near normal.  2.  Mild pulmonary arterial HTN (suspect group 3 from COPD).  3.  Low, but not markedly low cardiac index at 2.1.    - Increase torsemide to 60 qam/40 qpm, will follow for metolazone need (had dose yesterday).   - Continue workup with CPX (scheduled), then will regroup with her in the office.    CPX 02/09/20: Referred for: CHF   Procedure: This patient underwent staged symptom-limited exercise treadmill testing using a modified Naughton protocol with expired gas analysis metabolic evaluation during exercise.   Demographics   Age: 65 Ht. (in.) 60 Wt. (lb) 133.4 BMI: 26.1    Predicted Peak VO2: 13.0   Gender: Female Ht (cm) 152.4 Wt. (kg) 60.5    Results   Pre-Exercise PFTs   FVC 2.08 (80%)     FEV1 1.69 (82%)      FEV1/FVC 81 (102%)      MVV 65 (80%)       Exercise Time:  10:00  Speed (mph): 2.0    Grade (%): 10.5   RPE: 18   Reason stopped: leg fatigue   Additional symptoms: dyspnea (5/10) and lightheaded (3/10)   Resting HR: 92 Standing HR:93 Peak HR: 121  (78% age predicted max HR)   BP rest: 110/62 Standing BP: 106/62 BP peak: 144/58   Peak VO2: 13.0 (58% predicted peak VO2)   VE/VCO2 slope: 37   OUES: 0.89   Peak RER: 1.06   Ventilatory Threshold: 11.5 (51% predicted or measured peak VO2)   Peak RR 34   Peak Ventilation: 32.2   VE/MVV: 50%   PETCO2 at peak: 31   O2pulse: 7  (78% predicted O2pulse)    Interpretation   Notes: Patient gave a very good effort. Pulse-oximetry remained 93-97% for the duration of exercise.   ECG: Resting ECG in NSR with LVH and occasioanl PVCs. HR response blunted. There were no sustained arrhythmias or ST-T changes. BP response appropriate.   PFT: Pre-exercise spirometry was within normal limits. The MVV was normal.   CPX: Exercise testing with gas exchange demonstrates a moderate to severely reduced peak VO2 of 13.0 ml/kg/min (58% of the age/gender/weight matched sedentary norms). The RER of 1.06 indicates a near maximal effort. When adjusted to the patient's ideal body weight of 124 lb (56.3 kg) the peak VO2 is 14.0 ml/kg (ibw)/min (61% of the ibw-adjusted predicted). The VE/VCO2 slope is moderately elevated and indicates excessive dead space ventilation. The oxygen uptake efficiency slope (OUES) is severely reduced. The VO2 at the ventilatory threshold was normal at 51% of the predicted peak VO2. At peak exercise, the ventilation reached 50% of the measured MVV indicating ventilatory reserve remained. The O2pulse (a surrogate for stroke volume) increased with  incremental exercise, reaching peak at 7 ml/beat (78% predicted).    Conclusion: Exercise testing with gas exchange demonstrates moderate functional impairment when compared to matched sedentary norms. Patient has a moderate  HF limitation with high risk factors predicting worsening short-term prognosis. There was chronotropic incompetence.    Test, report and preliminary impression by:  Landis Martins, MS, ACSM-RCEP  02/09/2020 2:50 PM   Agree with above. Moderate to severe HF limitation.    Glori Bickers, MD  8:22 PM   Assessment / Plan: 1. CAD S/p CABG. Failed bypass grafts except for LIMA to LAD. S/p remote stenting of LCX and OM with Taxus DES now occluded. Stable class 1- 2 angina. Really unchanged from before. No targets for intervention. Continue aggressive antianginal Rx. With ASA, plavix,  and Ranexa. Intolerant of beta blockers and nitrates due to hypotension. Encourage  smoking cessation.  2. Carotid arterial disese- s/p right carotid stenting. Followed by Dr. Gwenlyn Found. On chronic ASA/Plavix 3. Tobacco abuse. Recommend complete cessation.  4. Chronic combined systolic and diastolic CHF. Severe LV dysfunction. Intolerant of ARB/entresto and Coreg due to hypotension. Now on torsemide, aldactone and digoxin as well as vericiguat. Not a candidate for other advanced therapies such as LVAD or transplant. Not a candidate for barostim due to carotid disease. Last EF 35%. Dr Aundra Dubin to repeat.  5. Pulmonary HTN. 6. S/p major abdominal surgery for cystadenoma. Now with CT showing evidence of incisional hernia. Poor candidate for general anesthesia. Conservative management.  7. Lacunar infarcts.  8. PAD. S/p right SFA intervention without significant clinical improvement. Chronic leg fatigue 10. Iron deficiency anemia with FOBT positive. Prior EGD showed no reason for blood loss. Receiving intermittent iron infusions. Recheck on Monday. Hasn't needed an infusion in awhile.   Since she is  actively followed in Advanced heart failure clinic, EP, and Dr Gwenlyn Found. I will see yearly unless I can be of assistance otherwise.

## 2021-01-06 ENCOUNTER — Other Ambulatory Visit: Payer: Self-pay

## 2021-01-06 ENCOUNTER — Ambulatory Visit (INDEPENDENT_AMBULATORY_CARE_PROVIDER_SITE_OTHER): Payer: Medicare Other | Admitting: Cardiology

## 2021-01-06 ENCOUNTER — Encounter: Payer: Self-pay | Admitting: Cardiology

## 2021-01-06 VITALS — BP 120/72 | HR 88 | Ht <= 58 in | Wt 126.2 lb

## 2021-01-06 DIAGNOSIS — Z951 Presence of aortocoronary bypass graft: Secondary | ICD-10-CM

## 2021-01-06 DIAGNOSIS — I739 Peripheral vascular disease, unspecified: Secondary | ICD-10-CM | POA: Diagnosis not present

## 2021-01-06 DIAGNOSIS — I255 Ischemic cardiomyopathy: Secondary | ICD-10-CM

## 2021-01-06 DIAGNOSIS — F17200 Nicotine dependence, unspecified, uncomplicated: Secondary | ICD-10-CM | POA: Diagnosis not present

## 2021-01-06 DIAGNOSIS — E7800 Pure hypercholesterolemia, unspecified: Secondary | ICD-10-CM | POA: Diagnosis not present

## 2021-01-06 DIAGNOSIS — I6523 Occlusion and stenosis of bilateral carotid arteries: Secondary | ICD-10-CM

## 2021-01-06 DIAGNOSIS — I5022 Chronic systolic (congestive) heart failure: Secondary | ICD-10-CM | POA: Diagnosis not present

## 2021-01-06 DIAGNOSIS — I25708 Atherosclerosis of coronary artery bypass graft(s), unspecified, with other forms of angina pectoris: Secondary | ICD-10-CM | POA: Diagnosis not present

## 2021-01-06 MED ORDER — RANOLAZINE ER 1000 MG PO TB12: 1000.0000 mg | ORAL_TABLET | Freq: Two times a day (BID) | ORAL | 3 refills | Status: DC

## 2021-01-06 MED ORDER — CLOPIDOGREL BISULFATE 75 MG PO TABS: 1.0000 | ORAL_TABLET | Freq: Every day | ORAL | 3 refills | Status: DC

## 2021-01-09 ENCOUNTER — Other Ambulatory Visit: Payer: Self-pay

## 2021-01-09 ENCOUNTER — Inpatient Hospital Stay (HOSPITAL_BASED_OUTPATIENT_CLINIC_OR_DEPARTMENT_OTHER): Payer: Medicare Other | Admitting: Internal Medicine

## 2021-01-09 ENCOUNTER — Inpatient Hospital Stay: Payer: Medicare Other | Attending: Internal Medicine

## 2021-01-09 ENCOUNTER — Other Ambulatory Visit: Payer: Self-pay | Admitting: Internal Medicine

## 2021-01-09 VITALS — BP 116/65 | HR 88 | Temp 95.4°F | Resp 17 | Ht <= 58 in | Wt 125.3 lb

## 2021-01-09 DIAGNOSIS — D509 Iron deficiency anemia, unspecified: Secondary | ICD-10-CM | POA: Insufficient documentation

## 2021-01-09 DIAGNOSIS — I10 Essential (primary) hypertension: Secondary | ICD-10-CM | POA: Diagnosis not present

## 2021-01-09 DIAGNOSIS — I255 Ischemic cardiomyopathy: Secondary | ICD-10-CM | POA: Diagnosis not present

## 2021-01-09 LAB — IRON AND TIBC
Iron: 54 ug/dL (ref 41–142)
Saturation Ratios: 9 % — ABNORMAL LOW (ref 21–57)
TIBC: 609 ug/dL — ABNORMAL HIGH (ref 236–444)
UIBC: 555 ug/dL — ABNORMAL HIGH (ref 120–384)

## 2021-01-09 LAB — CBC WITH DIFFERENTIAL (CANCER CENTER ONLY)
Abs Immature Granulocytes: 0.02 10*3/uL (ref 0.00–0.07)
Basophils Absolute: 0 10*3/uL (ref 0.0–0.1)
Basophils Relative: 1 %
Eosinophils Absolute: 0.1 10*3/uL (ref 0.0–0.5)
Eosinophils Relative: 1 %
HCT: 43.4 % (ref 36.0–46.0)
Hemoglobin: 14.4 g/dL (ref 12.0–15.0)
Immature Granulocytes: 0 %
Lymphocytes Relative: 25 %
Lymphs Abs: 1.7 10*3/uL (ref 0.7–4.0)
MCH: 27 pg (ref 26.0–34.0)
MCHC: 33.2 g/dL (ref 30.0–36.0)
MCV: 81.4 fL (ref 80.0–100.0)
Monocytes Absolute: 0.7 10*3/uL (ref 0.1–1.0)
Monocytes Relative: 10 %
Neutro Abs: 4.5 10*3/uL (ref 1.7–7.7)
Neutrophils Relative %: 63 %
Platelet Count: 244 10*3/uL (ref 150–400)
RBC: 5.33 MIL/uL — ABNORMAL HIGH (ref 3.87–5.11)
RDW: 14.9 % (ref 11.5–15.5)
WBC Count: 6.9 10*3/uL (ref 4.0–10.5)
nRBC: 0 % (ref 0.0–0.2)

## 2021-01-09 LAB — FERRITIN: Ferritin: 13 ng/mL (ref 11–307)

## 2021-01-09 NOTE — Progress Notes (Signed)
[facility] Telephone:([phone]) [phone]   Fax:(336) [phone]  OFFICE PROGRESS NOTE  Tower, [name], MD [place] [address]  DIAGNOSIS: Iron Deficiency Anemia  PRIOR THERAPY:  1) Oral iron supplement daily. Discontinued in September 2021. 2) iron infusion with Venofer last dose was given January 29, 2020.  CURRENT THERAPY:  None.   INTERVAL HISTORY: Diane [name] ABT 65 y.o. female returns to the clinic today for follow-up visit.  The patient is feeling fine today with no concerning complaints.  She had ICD performed recently.  She denied having any chest pain, shortness of breath, cough or hemoptysis.  She denied having any fever or chills.  She has no nausea, vomiting, diarrhea or constipation.  She has no headache or visual changes.  The patient is here today for evaluation history with blood work.  MEDICAL HISTORY: Past Medical History:  Diagnosis Date  . Anemia   . Carotid stenosis    a. s/p right carotid stent 10/2017.  [name] Chronic systolic CHF (congestive heart failure) ([place])   . Colon polyps   . COPD (chronic obstructive pulmonary disease) ([place])   . Coronary artery disease    post CABG in 3/07 , coronary stents   . Diabetes mellitus    type 2  . Dyslipidemia   . Fibromyalgia   . GERD (gastroesophageal reflux disease)   . Headache    hx of   . Hyperlipidemia   . Hypertension   . Myocardial infarction ([place])   . Pneumonia    hx of   . Seizures ([place])    last seizure- 03/2013   . Shortness of breath dyspnea    with exertion or when fluid builds up   . Sleep apnea    used to wear a cpap- not used in 3 years   . Status post dilation of esophageal narrowing   . Stroke [facility]) 1993   problems with balance   . Systolic murmur    known mild AS and MR  . Thyroid goiter   . Tobacco abuse     ALLERGIES:  is allergic to bee venom, benadryl [diphenhydramine], entresto [sacubitril-valsartan], farxiga [dapagliflozin], tetracycline, and  ace inhibitors.  MEDICATIONS:  Current Outpatient Medications  Medication Sig Dispense Refill  . acetaminophen (TYLENOL) 500 MG tablet Take 1,000 mg by mouth every 6 (six) hours as needed for moderate pain or headache.    . albuterol (VENTOLIN HFA) 108 (90 Base) MCG/ACT inhaler INHALE 2 PUFFS EVERY 6 HOURS AS NEEDED FOR WHEEZING/SHORTNESS OF BREATH 18 each 0  . aspirin EC 81 MG tablet Take 81 mg by mouth every evening.  30 tablet 6  . clopidogrel (PLAVIX) 75 MG tablet Take 1 tablet (75 mg total) by mouth daily. 90 tablet 3  . DEXILANT 60 MG capsule TAKE 1 CAPSULE DAILY 90 capsule 1  . digoxin (LANOXIN) 0.125 MG tablet TAKE 0.5 TABLETS (0.0625 MG TOTAL) BY MOUTH DAILY. 45 tablet 3  . diphenhydrAMINE (BENADRYL) 25 mg capsule Take 25 mg by mouth 2 (two) times daily as needed for itching or allergies.     [name] ezetimibe (ZETIA) 10 MG tablet TAKE 1 TABLET BY MOUTH EVERY DAY 90 tablet 2  . fluticasone (FLONASE) 50 MCG/ACT nasal spray Place 2 sprays into both nostrils daily. 48 g 1  . gabapentin (NEURONTIN) 300 MG capsule TAKE 2 CAPSULES BY MOUTH EVERY DAY AT BEDTIME 180 capsule 1  . Glycopyrrolate-Formoterol (BEVESPI AEROSPHERE) 9-4.8 MCG/ACT AERO Inhale  2 puffs into the lungs 2 (two) times daily. 32.1 g 1  . guaiFENesin (MUCINEX) 600 MG 12 hr tablet Take 2 tablets (1,200 mg total) by mouth 2 (two) times daily. 360 tablet 3  . guaiFENesin-codeine (ROBITUSSIN AC) 100-10 MG/5ML syrup Take 5 mLs by mouth 3 (three) times daily as needed for cough. 120 mL 0  . HYDROcodone-acetaminophen (NORCO) 5-325 MG tablet Take 1 tablet by mouth daily as needed for severe pain. 15 tablet 0  . insulin aspart (NOVOLOG) 100 UNIT/ML injection Inject 45 Units into the skin in the morning and at bedtime.    . iron polysaccharides (NIFEREX) 150 MG capsule Take 150 mg by mouth. 3 times per week    . KLOR-CON M20 20 MEQ tablet TAKE 1 TABLET TWICE A DAY 180 tablet 0  . levETIRAcetam (KEPPRA) 1000 MG tablet Take 1 tablet (1,000 mg  total) by mouth 2 (two) times daily. 180 tablet 3  . metFORMIN (GLUCOPHAGE-XR) 500 MG 24 hr tablet Take 500 mg by mouth every evening.   6  . metolazone (ZAROXOLYN) 2.5 MG tablet Take 1 tablet (2.5 mg total) by mouth 2 (two) times a week. Every Mon and Fri 26 tablet 3  . nicotine polacrilex (NICORETTE) 4 MG gum Take 1 each (4 mg total) by mouth as needed for smoking cessation. 90 tablet 2  . nitroGLYCERIN (NITROLINGUAL) 0.4 MG/SPRAY spray Place 1 spray under the tongue every 5 (five) minutes x 3 doses as needed for chest pain. 12 g 3  . NYSTATIN powder APPLY 1 APPLICATION        TOPICALLY 4 TIMES A DAY AS NEEDED FOR YEAST INFECTIONS 60 g 3  . ONE TOUCH ULTRA TEST test strip USE AS DIRECTED FOR TESTING BLOOD GLUCOSE 3 TIMES DAILY  1  . polyethylene glycol (MIRALAX / GLYCOLAX) 17 g packet Take 17 g by mouth daily.    . promethazine (PHENERGAN) 25 MG tablet Take 1 tablet (25 mg total) by mouth every 8 (eight) hours as needed for nausea or vomiting. Caution of sedation 30 tablet 1  . ranolazine (RANEXA) 1000 MG SR tablet Take 1 tablet (1,000 mg total) by mouth 2 (two) times daily. 180 tablet 3  . rosuvastatin (CRESTOR) 20 MG tablet Take 1 tablet (20 mg total) by mouth daily. 30 tablet 6  . sodium chloride (OCEAN) 0.65 % SOLN nasal spray Place 1 spray into both nostrils as needed for congestion.    [name] spironolactone (ALDACTONE) 25 MG tablet Take 1 tablet (25 mg total) by mouth at bedtime. 90 tablet 3  . torsemide (DEMADEX) 20 MG tablet TAKE 3 TABLETS (60 MG TOTAL) BY MOUTH EVERY MORNING AND 2 TABLETS (40 MG TOTAL) EVERY EVENING. 450 tablet 3  . VASCEPA 1 g capsule TAKE 2 CAPSULES (2 G TOTAL) BY MOUTH 2 (TWO) TIMES DAILY. 120 capsule 3  . Vericiguat 10 MG TABS TAKE 1 TABLET BY MOUTH ONCE DAILY 90 tablet 3   No current facility-administered medications for this visit.    SURGICAL HISTORY:  Past Surgical History:  Procedure Laterality Date  . ABDOMINAL AORTOGRAM W/LOWER EXTREMITY N/A 04/18/2020    Procedure: ABDOMINAL AORTOGRAM W/LOWER EXTREMITY;  Surgeon: Lorretta Harp, MD;  Location: [facility];  Service: Cardiovascular;  Laterality: N/A;  . APPENDECTOMY    . BIOPSY  02/03/2019   Procedure: BIOPSY;  Surgeon: Ladene Artist, MD;  Location: [facility];  Service: Endoscopy;;  . BREAST BIOPSY Left 2016  . CARDIAC CATHETERIZATION  11/29/05  EF of 55%  . CARDIAC CATHETERIZATION  08/06/06   EF of 45-50%  . CARDIAC CATHETERIZATION N/A 05/06/2015   Procedure: Right/Left Heart Cath and Coronary/Graft Angiography;  Surgeon: Peter M Martinique, MD;  Location: [facility];  Service: Cardiovascular;  Laterality: N/A;  . CAROTID PTA/STENT INTERVENTION N/A 10/10/2017   Procedure: CAROTID PTA/STENT INTERVENTION - Right;  Surgeon: Serafina Mitchell, MD;  Location: [facility];  Service: Cardiovascular;  Laterality: N/A;  . CERVICAL FUSION  1990  . CHOLECYSTECTOMY    . COLON RESECTION     mass removed and 4 in of colon  . COLONOSCOPY WITH PROPOFOL N/A 07/16/2017   Procedure: COLONOSCOPY WITH PROPOFOL;  Surgeon: Ladene Artist, MD;  [facility]: [facility];  Service: Endoscopy;  Laterality: N/A;  . CORONARY ARTERY BYPASS GRAFT  12/04/2005   x5 -- left internal mammary artery to the LAD, left radial artery to the ramus intermedius, saphenous vein graft to the obtuse marginal 1, sequential saphenous vein grat to the acute marginal and posterior descending, endoscopic vein harvesting from the left thigh with open vein harvest from right leg  . CORONARY STENT PLACEMENT  08/11/06   PCI of her ciurcumflex/OM vessel  . ESOPHAGOGASTRODUODENOSCOPY (EGD) WITH PROPOFOL N/A 02/03/2019   Procedure: ESOPHAGOGASTRODUODENOSCOPY (EGD) WITH PROPOFOL;  Surgeon: Ladene Artist, MD;  [facility]: [facility];  Service: Endoscopy;  Laterality: N/A;  . ICD IMPLANT N/A 07/15/2020   Procedure: ICD IMPLANT;  Surgeon: Vickie Epley, MD;  Location: [facility];  Service: Cardiovascular;  Laterality:  N/A;  . LAPAROTOMY Bilateral 05/19/2015   Procedure: EXPLORATORY LAPAROTOMY WITH BILATERAL SALPINGO OOPHORECTOMY /OMENTECTOMY/SEGMENTAL SIGMOID COLECTOMY ;  Surgeon: Everitt Amber, MD;  Location: [facility];  Service: Gynecology;  Laterality: Bilateral;  . PERIPHERAL VASCULAR BALLOON ANGIOPLASTY  04/18/2020   Procedure: PERIPHERAL VASCULAR BALLOON ANGIOPLASTY;  Surgeon: Lorretta Harp, MD;  Location: [facility];  Service: Cardiovascular;;  rt SFA  atherectomy and DCB  . PERIPHERAL VASCULAR CATHETERIZATION N/A 11/15/2015   Procedure: Carotid PTA/Stent Intervention;  Surgeon: Lorretta Harp, MD;  Location: [facility];  Service: Cardiovascular;  Laterality: N/A;  . PERIPHERAL VASCULAR CATHETERIZATION  11/15/2015   Procedure: Carotid Angiography;  Surgeon: Lorretta Harp, MD;  Location: [facility];  Service: Cardiovascular;;  . RIGHT HEART CATH N/A 01/27/2020   Procedure: RIGHT HEART CATH;  Surgeon: Larey Dresser, MD;  Location: [facility];  Service: Cardiovascular;  Laterality: N/A;    REVIEW OF SYSTEMS:  A comprehensive review of systems was negative.   PHYSICAL EXAMINATION: General appearance: alert, cooperative and no distress Head: Normocephalic, without obvious abnormality, atraumatic Neck: no adenopathy, no JVD, supple, symmetrical, trachea midline and thyroid not enlarged, symmetric, no tenderness/mass/nodules Lymph nodes: Cervical, supraclavicular, and axillary nodes normal. Resp: clear to auscultation bilaterally Back: symmetric, no curvature. ROM normal. No CVA tenderness. Cardio: regular rate and rhythm, S1, S2 normal, no murmur, click, rub or gallop GI: soft, non-tender; bowel sounds normal; no masses,  no organomegaly Extremities: extremities normal, atraumatic, no cyanosis or edema  ECOG PERFORMANCE STATUS: 1 - Symptomatic but completely ambulatory  Blood pressure 116/65, pulse 88, temperature (!) 95.4 F (35.2 C), temperature source Tympanic,  resp. rate 17, height 4\' 10"  (1.473 m), weight 125 lb 4.8 oz (56.8 kg), last menstrual period 09/02/2003, SpO2 100 %.  LABORATORY DATA: Lab Results  Component Value Date   WBC 6.9 01/09/2021   HGB 14.4 01/09/2021   HCT  43.4 01/09/2021   MCV 81.4 01/09/2021   PLT 244 01/09/2021      Chemistry      Component Value Date/Time   NA 138 11/17/2020 1457   NA 135 04/14/2020 1544   NA 135 (L) 02/15/2017 1457   K 4.9 11/17/2020 1457   K 4.8 02/15/2017 1457   CL 101 11/17/2020 1457   CO2 28 11/17/2020 1457   CO2 26 02/15/2017 1457   BUN 11 11/17/2020 1457   BUN 17 04/14/2020 1544   BUN 10.8 02/15/2017 1457   CREATININE 0.81 11/17/2020 1457   CREATININE 0.86 07/07/2020 1416   CREATININE 0.69 05/06/2017 1405   CREATININE 0.7 02/15/2017 1457      Component Value Date/Time   CALCIUM 9.7 11/17/2020 1457   CALCIUM 9.3 02/15/2017 1457   ALKPHOS 77 07/07/2020 1416   ALKPHOS 75 02/15/2017 1457   AST 14 (L) 07/07/2020 1416   AST 9 02/15/2017 1457   ALT 14 07/07/2020 1416   ALT 13 02/15/2017 1457   BILITOT 0.4 07/07/2020 1416   BILITOT 0.28 02/15/2017 1457       RADIOGRAPHIC STUDIES: No results found.  ASSESSMENT AND PLAN: This is a very pleasant 65 years old white female with history of iron deficiency anemia and intolerance to oral iron tablets.  She was treated with iron infusion with Venofer but last dose was given in April 2021 and she has been doing well since that time. She had repeat CBC earlier today that showed hemoglobin of 14.4 and hematocrit of 43.4%. Iron study and ferritin are still pending. I recommended for the patient to continue on observation and follow-up with her primary care physician unless the iron study showed severe iron deficiency, will call her for iron infusion. The patient was advised to call if she has any other concerning issues.  The patient voices understanding of current disease status and treatment options and is in agreement with the current care  plan.  All questions were answered. The patient knows to call the clinic with any problems, questions or concerns. We can certainly see the patient much sooner if necessary.  The total time spent in the appointment was 20 minutes.  Disclaimer: This note was dictated with voice recognition software. Similar sounding words can inadvertently be transcribed and may not be corrected upon review.

## 2021-01-10 ENCOUNTER — Telehealth: Payer: Self-pay | Admitting: Internal Medicine

## 2021-01-10 NOTE — Telephone Encounter (Signed)
Informed patient of her upcoming appointment. Explained to patient that since we were at capacity, the infusion was at [facility]. Patient stated she does not want to go to [facility] and cancelled her appointment. Patient stated she would come in for her next appointment instead.

## 2021-01-13 ENCOUNTER — Ambulatory Visit: Payer: Medicare Other

## 2021-01-16 ENCOUNTER — Telehealth (HOSPITAL_COMMUNITY): Payer: Self-pay | Admitting: *Deleted

## 2021-01-16 ENCOUNTER — Other Ambulatory Visit: Payer: Self-pay | Admitting: Family Medicine

## 2021-01-16 NOTE — Telephone Encounter (Signed)
Diane [name] w/care connections left VM stating since starting Vericiguat pt has c/o cough. Pt said she had the same cough with entresto. Pt wants to know if she can stop verquvo for a few days and see if that is the cause of her cough.    Routed to [place] for advice  Call back # [phone]

## 2021-01-16 NOTE — Telephone Encounter (Signed)
Called care connection no answer. Called pt no answer/left vm for pt to return my call.

## 2021-01-16 NOTE — Telephone Encounter (Signed)
Verquvo should not cause cough, may be some other source of cough (fluid excess, allergies, etc).

## 2021-01-18 ENCOUNTER — Other Ambulatory Visit (HOSPITAL_COMMUNITY): Payer: Self-pay

## 2021-01-18 NOTE — Telephone Encounter (Signed)
Pt said she believes its the verquvo and will stop it for 1-2 days to see if this is whats causing the cough. Pt said the cough is so bad she can't sleep at night. Pt will call back Friday and let us know if she has had any relief.

## 2021-01-20 ENCOUNTER — Inpatient Hospital Stay: Payer: Medicare Other

## 2021-01-20 ENCOUNTER — Other Ambulatory Visit: Payer: Self-pay

## 2021-01-20 VITALS — BP 138/85 | HR 86 | Temp 97.8°F | Resp 18

## 2021-01-20 DIAGNOSIS — D509 Iron deficiency anemia, unspecified: Secondary | ICD-10-CM | POA: Diagnosis not present

## 2021-01-20 MED ORDER — ACETAMINOPHEN 325 MG PO TABS
650.0000 mg | ORAL_TABLET | Freq: Once | ORAL | Status: AC
  Administered 2021-01-20: 650 mg via ORAL

## 2021-01-20 MED ORDER — SODIUM CHLORIDE 0.9 % IV SOLN
Freq: Once | INTRAVENOUS | Status: AC
  Filled 2021-01-20: qty 250

## 2021-01-20 MED ORDER — ACETAMINOPHEN 325 MG PO TABS
ORAL_TABLET | ORAL | Status: AC
  Filled 2021-01-20: qty 2

## 2021-01-20 MED ORDER — DIPHENHYDRAMINE HCL 25 MG PO CAPS: 25.0000 mg | ORAL_CAPSULE | Freq: Once | ORAL | Status: DC

## 2021-01-20 MED ORDER — SODIUM CHLORIDE 0.9 % IV SOLN
300.0000 mg | Freq: Once | INTRAVENOUS | Status: AC
  Administered 2021-01-20: 300 mg via INTRAVENOUS
  Filled 2021-01-20: qty 300

## 2021-01-20 NOTE — Progress Notes (Signed)
Patient refuses benadryl today before iron infusion. The patient was informed there is a risk of reaction without this pre-medication. Patient verbalizes understanding

## 2021-01-20 NOTE — Patient Instructions (Signed)

## 2021-01-21 ENCOUNTER — Other Ambulatory Visit: Payer: Self-pay | Admitting: Pulmonary Disease

## 2021-01-21 DIAGNOSIS — J9611 Chronic respiratory failure with hypoxia: Secondary | ICD-10-CM

## 2021-01-24 ENCOUNTER — Ambulatory Visit (INDEPENDENT_AMBULATORY_CARE_PROVIDER_SITE_OTHER): Payer: Medicare Other

## 2021-01-24 DIAGNOSIS — I255 Ischemic cardiomyopathy: Secondary | ICD-10-CM | POA: Diagnosis not present

## 2021-01-24 LAB — CUP PACEART REMOTE DEVICE CHECK
Battery Remaining Longevity: 174 mo
Battery Remaining Percentage: 100 %
Brady Statistic RV Percent Paced: 0 %
Date Time Interrogation Session: 20220426041200
HighPow Impedance: 87 Ohm
Implantable Lead Implant Date: 20211015
Implantable Lead Location: 753860
Implantable Lead Model: 672
Implantable Lead Serial Number: 166771
Implantable Pulse Generator Implant Date: 20211015
Lead Channel Impedance Value: 1051 Ohm
Lead Channel Setting Pacing Amplitude: 3.5 V
Lead Channel Setting Pacing Pulse Width: 0.4 ms
Lead Channel Setting Sensing Sensitivity: 0.5 mV
Pulse Gen Serial Number: 279742

## 2021-01-26 DIAGNOSIS — E1121 Type 2 diabetes mellitus with diabetic nephropathy: Secondary | ICD-10-CM | POA: Diagnosis not present

## 2021-01-26 DIAGNOSIS — E7800 Pure hypercholesterolemia, unspecified: Secondary | ICD-10-CM | POA: Diagnosis not present

## 2021-01-26 DIAGNOSIS — I10 Essential (primary) hypertension: Secondary | ICD-10-CM | POA: Diagnosis not present

## 2021-01-26 DIAGNOSIS — E119 Type 2 diabetes mellitus without complications: Secondary | ICD-10-CM | POA: Diagnosis not present

## 2021-01-26 DIAGNOSIS — G629 Polyneuropathy, unspecified: Secondary | ICD-10-CM | POA: Diagnosis not present

## 2021-01-26 DIAGNOSIS — E041 Nontoxic single thyroid nodule: Secondary | ICD-10-CM | POA: Diagnosis not present

## 2021-01-26 DIAGNOSIS — I509 Heart failure, unspecified: Secondary | ICD-10-CM | POA: Diagnosis not present

## 2021-01-27 ENCOUNTER — Inpatient Hospital Stay: Payer: Medicare Other

## 2021-01-27 ENCOUNTER — Other Ambulatory Visit: Payer: Self-pay

## 2021-01-27 VITALS — BP 128/67 | HR 79 | Temp 97.9°F | Resp 17 | Ht <= 58 in

## 2021-01-27 DIAGNOSIS — D509 Iron deficiency anemia, unspecified: Secondary | ICD-10-CM

## 2021-01-27 MED ORDER — DIPHENHYDRAMINE HCL 25 MG PO CAPS: 25.0000 mg | ORAL_CAPSULE | Freq: Once | ORAL | Status: DC

## 2021-01-27 MED ORDER — ACETAMINOPHEN 325 MG PO TABS
650.0000 mg | ORAL_TABLET | Freq: Once | ORAL | Status: AC
  Administered 2021-01-27: 650 mg via ORAL

## 2021-01-27 MED ORDER — SODIUM CHLORIDE 0.9 % IV SOLN
Freq: Once | INTRAVENOUS | Status: AC
Start: 2021-01-27 — End: 2021-01-27
  Filled 2021-01-27: qty 250

## 2021-01-27 MED ORDER — SODIUM CHLORIDE 0.9 % IV SOLN
300.0000 mg | Freq: Once | INTRAVENOUS | Status: AC
  Administered 2021-01-27: 300 mg via INTRAVENOUS
  Filled 2021-01-27: qty 200

## 2021-01-27 MED ORDER — ACETAMINOPHEN 325 MG PO TABS
ORAL_TABLET | ORAL | Status: AC
  Filled 2021-01-27: qty 2

## 2021-01-27 NOTE — Patient Instructions (Signed)

## 2021-01-31 ENCOUNTER — Telehealth: Payer: Self-pay

## 2021-01-31 ENCOUNTER — Other Ambulatory Visit: Payer: Self-pay | Admitting: Internal Medicine

## 2021-01-31 NOTE — Telephone Encounter (Signed)
Pt states it was her understanding that she would need 3 iron infusions but has only received 2. She wants to know if she needs to have a 3rd?

## 2021-02-01 ENCOUNTER — Other Ambulatory Visit: Payer: Self-pay | Admitting: Endocrinology

## 2021-02-01 ENCOUNTER — Telehealth: Payer: Self-pay | Admitting: Internal Medicine

## 2021-02-01 ENCOUNTER — Other Ambulatory Visit (HOSPITAL_COMMUNITY): Payer: Self-pay | Admitting: Cardiology

## 2021-02-01 ENCOUNTER — Other Ambulatory Visit: Payer: Self-pay | Admitting: Pulmonary Disease

## 2021-02-01 DIAGNOSIS — E041 Nontoxic single thyroid nodule: Secondary | ICD-10-CM

## 2021-02-01 DIAGNOSIS — E782 Mixed hyperlipidemia: Secondary | ICD-10-CM

## 2021-02-01 DIAGNOSIS — J9611 Chronic respiratory failure with hypoxia: Secondary | ICD-10-CM

## 2021-02-01 NOTE — Telephone Encounter (Signed)
Scheduled appt per 5/4 sch msg. Pt aware.

## 2021-02-07 ENCOUNTER — Other Ambulatory Visit: Payer: Self-pay

## 2021-02-07 ENCOUNTER — Inpatient Hospital Stay: Payer: Medicare Other | Attending: Internal Medicine

## 2021-02-07 VITALS — BP 111/65 | HR 74 | Resp 18

## 2021-02-07 DIAGNOSIS — D509 Iron deficiency anemia, unspecified: Secondary | ICD-10-CM | POA: Insufficient documentation

## 2021-02-07 MED ORDER — DIPHENHYDRAMINE HCL 25 MG PO CAPS: 25.0000 mg | ORAL_CAPSULE | Freq: Once | ORAL | Status: DC

## 2021-02-07 MED ORDER — SODIUM CHLORIDE 0.9 % IV SOLN
Freq: Once | INTRAVENOUS | Status: AC
  Filled 2021-02-07: qty 250

## 2021-02-07 MED ORDER — ACETAMINOPHEN 325 MG PO TABS
650.0000 mg | ORAL_TABLET | Freq: Once | ORAL | Status: AC
  Administered 2021-02-07: 650 mg via ORAL

## 2021-02-07 MED ORDER — IRON SUCROSE 20 MG/ML IV SOLN
300.0000 mg | Freq: Once | INTRAVENOUS | Status: AC
  Administered 2021-02-07: 300 mg via INTRAVENOUS
  Filled 2021-02-07: qty 300

## 2021-02-07 NOTE — Patient Instructions (Signed)

## 2021-02-07 NOTE — Progress Notes (Signed)
Patient refused post iron observation period. Stated she has had the treatment several times with no issues. VSS upon release

## 2021-02-14 ENCOUNTER — Ambulatory Visit
Admission: RE | Admit: 2021-02-14 | Discharge: 2021-02-14 | Disposition: A | Discharge status: Home / Self Care | Payer: Medicare Other | Source: Ambulatory Visit | Attending: Endocrinology | Admitting: Endocrinology

## 2021-02-14 DIAGNOSIS — E041 Nontoxic single thyroid nodule: Secondary | ICD-10-CM

## 2021-02-14 NOTE — Progress Notes (Signed)
Remote ICD transmission.   

## 2021-02-17 ENCOUNTER — Ambulatory Visit (HOSPITAL_BASED_OUTPATIENT_CLINIC_OR_DEPARTMENT_OTHER)
Admission: RE | Admit: 2021-02-17 | Discharge: 2021-02-17 | Disposition: A | Discharge status: Home / Self Care | Payer: Medicare Other | Source: Ambulatory Visit | Attending: Cardiology | Admitting: Cardiology

## 2021-02-17 ENCOUNTER — Other Ambulatory Visit (HOSPITAL_COMMUNITY): Payer: Self-pay | Admitting: *Deleted

## 2021-02-17 ENCOUNTER — Ambulatory Visit (HOSPITAL_COMMUNITY)
Admission: RE | Admit: 2021-02-17 | Discharge: 2021-02-17 | Disposition: A | Discharge status: Home / Self Care | Payer: Medicare Other | Source: Ambulatory Visit | Attending: Cardiology | Admitting: Cardiology

## 2021-02-17 ENCOUNTER — Other Ambulatory Visit: Payer: Self-pay

## 2021-02-17 VITALS — BP 126/70 | HR 81 | Ht <= 58 in | Wt 127.4 lb

## 2021-02-17 DIAGNOSIS — I6529 Occlusion and stenosis of unspecified carotid artery: Secondary | ICD-10-CM | POA: Insufficient documentation

## 2021-02-17 DIAGNOSIS — Z7984 Long term (current) use of oral hypoglycemic drugs: Secondary | ICD-10-CM | POA: Insufficient documentation

## 2021-02-17 DIAGNOSIS — Z7982 Long term (current) use of aspirin: Secondary | ICD-10-CM | POA: Diagnosis not present

## 2021-02-17 DIAGNOSIS — D509 Iron deficiency anemia, unspecified: Secondary | ICD-10-CM | POA: Diagnosis not present

## 2021-02-17 DIAGNOSIS — I2510 Atherosclerotic heart disease of native coronary artery without angina pectoris: Secondary | ICD-10-CM | POA: Diagnosis not present

## 2021-02-17 DIAGNOSIS — E1151 Type 2 diabetes mellitus with diabetic peripheral angiopathy without gangrene: Secondary | ICD-10-CM | POA: Diagnosis not present

## 2021-02-17 DIAGNOSIS — I255 Ischemic cardiomyopathy: Secondary | ICD-10-CM | POA: Insufficient documentation

## 2021-02-17 DIAGNOSIS — F17210 Nicotine dependence, cigarettes, uncomplicated: Secondary | ICD-10-CM | POA: Insufficient documentation

## 2021-02-17 DIAGNOSIS — E785 Hyperlipidemia, unspecified: Secondary | ICD-10-CM | POA: Insufficient documentation

## 2021-02-17 DIAGNOSIS — G4733 Obstructive sleep apnea (adult) (pediatric): Secondary | ICD-10-CM | POA: Diagnosis not present

## 2021-02-17 DIAGNOSIS — Z79899 Other long term (current) drug therapy: Secondary | ICD-10-CM | POA: Diagnosis not present

## 2021-02-17 DIAGNOSIS — I083 Combined rheumatic disorders of mitral, aortic and tricuspid valves: Secondary | ICD-10-CM | POA: Diagnosis not present

## 2021-02-17 DIAGNOSIS — I5022 Chronic systolic (congestive) heart failure: Secondary | ICD-10-CM | POA: Insufficient documentation

## 2021-02-17 DIAGNOSIS — Z951 Presence of aortocoronary bypass graft: Secondary | ICD-10-CM | POA: Insufficient documentation

## 2021-02-17 DIAGNOSIS — Z95810 Presence of automatic (implantable) cardiac defibrillator: Secondary | ICD-10-CM | POA: Insufficient documentation

## 2021-02-17 DIAGNOSIS — Z8249 Family history of ischemic heart disease and other diseases of the circulatory system: Secondary | ICD-10-CM | POA: Insufficient documentation

## 2021-02-17 DIAGNOSIS — Z7902 Long term (current) use of antithrombotics/antiplatelets: Secondary | ICD-10-CM | POA: Insufficient documentation

## 2021-02-17 DIAGNOSIS — E782 Mixed hyperlipidemia: Secondary | ICD-10-CM

## 2021-02-17 DIAGNOSIS — Z794 Long term (current) use of insulin: Secondary | ICD-10-CM | POA: Insufficient documentation

## 2021-02-17 DIAGNOSIS — Z955 Presence of coronary angioplasty implant and graft: Secondary | ICD-10-CM | POA: Diagnosis not present

## 2021-02-17 DIAGNOSIS — I110 Hypertensive heart disease with heart failure: Secondary | ICD-10-CM | POA: Diagnosis not present

## 2021-02-17 LAB — BASIC METABOLIC PANEL
Anion gap: 8 (ref 5–15)
BUN: 13 mg/dL (ref 8–23)
CO2: 31 mmol/L (ref 22–32)
Calcium: 9.6 mg/dL (ref 8.9–10.3)
Chloride: 95 mmol/L — ABNORMAL LOW (ref 98–111)
Creatinine, Ser: 0.81 mg/dL (ref 0.44–1.00)
GFR, Estimated: >60 mL/min (ref >60)
Glucose, Bld: 274 mg/dL — ABNORMAL HIGH (ref 70–99)
Potassium: 4.1 mmol/L (ref 3.5–5.1)
Sodium: 134 mmol/L — ABNORMAL LOW (ref 135–145)

## 2021-02-17 LAB — DIGOXIN LEVEL: Digoxin Level: 1 ng/mL (ref 0.8–2.0)

## 2021-02-17 LAB — ECHOCARDIOGRAM COMPLETE
AV Mean grad: 10 mmHg
Area-P 1/2: 3.72 cm2
S' Lateral: 3.9 cm

## 2021-02-17 MED ORDER — EZETIMIBE 10 MG PO TABS: 10.0000 mg | ORAL_TABLET | Freq: Every day | ORAL | 3 refills | Status: DC

## 2021-02-17 MED ORDER — ICOSAPENT ETHYL 1 G PO CAPS: ORAL_CAPSULE | ORAL | 3 refills | Status: DC

## 2021-02-17 MED ORDER — DIGOXIN 125 MCG PO TABS: 0.0625 mg | ORAL_TABLET | Freq: Every day | ORAL | 3 refills | Status: DC

## 2021-02-17 MED ORDER — SPIRONOLACTONE 25 MG PO TABS: ORAL_TABLET | ORAL | 3 refills | Status: DC

## 2021-02-17 MED ORDER — VERICIGUAT 10 MG PO TABS: 1.0000 | ORAL_TABLET | Freq: Every day | ORAL | 3 refills | Status: DC

## 2021-02-17 MED ORDER — CLOPIDOGREL BISULFATE 75 MG PO TABS: 1.0000 | ORAL_TABLET | Freq: Every day | ORAL | 3 refills | Status: DC

## 2021-02-17 MED ORDER — METOLAZONE 2.5 MG PO TABS: 2.5000 mg | ORAL_TABLET | ORAL | 3 refills | Status: DC

## 2021-02-17 MED ORDER — ROSUVASTATIN CALCIUM 20 MG PO TABS: 20.0000 mg | ORAL_TABLET | Freq: Every day | ORAL | 3 refills | Status: DC

## 2021-02-17 MED ORDER — RANOLAZINE ER 1000 MG PO TB12: 1000.0000 mg | ORAL_TABLET | Freq: Two times a day (BID) | ORAL | 3 refills | Status: DC

## 2021-02-17 MED ORDER — TORSEMIDE 20 MG PO TABS: ORAL_TABLET | ORAL | 3 refills | Status: DC

## 2021-02-17 NOTE — Progress Notes (Signed)
  Echocardiogram 2D Echocardiogram has been performed.  Diane [name] 02/17/2021, 1:48 PM

## 2021-02-17 NOTE — Patient Instructions (Signed)
Labs done today, your results will be available in MyChart, we will contact you for abnormal readings.  Your physician recommends that you schedule a follow-up appointment in: 4 months  If you have any questions or concerns before your next appointment please send [place] a message through [place] or call our office at [phone].    TO LEAVE A MESSAGE FOR THE NURSE SELECT OPTION 2, PLEASE LEAVE A MESSAGE INCLUDING: . YOUR NAME . DATE OF BIRTH . CALL BACK NUMBER . REASON FOR CALL**this is important as we prioritize the call backs  [place] AS LONG AS YOU CALL BEFORE 4:00 PM  At the [facility], you and your health needs are our priority. As part of our continuing mission to provide you with exceptional heart care, we have created designated Provider Care Teams. These Care Teams include your primary Cardiologist (physician) and Advanced Practice Providers (APPs- Physician Assistants and Nurse Practitioners) who all work together to provide you with the care you need, when you need it.   You may see any of the following providers on your designated Care Team at your next follow up: [name] Dr Glori Bickers . Dr Loralie Champagne . Dr Vickki Muff . Darrick Grinder, NP . Lyda Jester, [place] . Audry Riles, PharmD   Please be sure to bring in all your medications bottles to every appointment.

## 2021-02-19 NOTE — Progress Notes (Signed)
PCP: [name], MD Cardiology: Dr. Martinique HF Cardiology: Dr. Aundra Dubin  65 y.o. with history of CAD, ischemic cardiomyopathy, CVA, and valvular disease was referred by Dr. Martinique for CHF evaluation.  Patient has an extensive history of vascular disease.  She had CABG in 2007.  Repeat LHC in 2007 showed all grafts closed except LIMA-LAD.  Patient had DES to LCx/OM.  [place] 2016 showed occlusion of DES to LCx/OM with L-L and L-R collaterals. Poor targets for redo CABG.   In 2/21, echo was done showing EF 20-25% with moderate RV dysfunction, moderate-severe MR, moderate TR.  Despite smoking history, PFTs in 2019 looked relatively normal. She additionally had the placement of a carotid stent on the right in 1/19.    RHC was done in 4/21, showing near-normal filling pressures with CI 2.12 by Fick.  CPX in 5/21 showed peak VO2 13, VE/VCO2 slope 37 => moderate HF limitation.  The PFTs on the CPX were not markedly abnormal. She saw Dr. Gwenlyn Found recently, peripheral arterial dopplers in 4/21 showed significant stenosis right SFA.   We worked her up for LVAD.  She was presented at [facility] and saw Dr. Prescott Gum.  He was concerned by her small stature and small LV cavity. Concern that Heartmate 3 would not function well in her small LV, and Heartware LVAD that was thought to be more suitable for small patients has been discontinued.   We considered her for a barostimulation trial, but she does not qualify for either ANTHEM or Batwire due to carotid disease.   She is off dapagliflozin due to recurrrent GU yeast infections.  In 7/21, she had atherectomy of right SFA.  Cayce placed in 10/21.   As she was turned down for LVAD at [facility], I had her seen at [facility]. She underwent workup there with echo in 11/21 showing higher EF 35%. RHC, however, showed low CI 1.8.    Echo was done today and again showed improvement in EF, now up to 45-50% with basal-mid inferolateral and anterolateral akinesis, normal RV.    Patient returns for followup of CHF.  She is still smoking but trying to cut back.  She still has right calf and thigh cramps after walking about 100 feet (to mailbox).  She also has right buttock area pain that also may be claudication.  Weight stable. Rare atypical chest pain.  She is not short of breath walking on flat ground, more limited by claudication.  She has numbness/tingling in her fingers.      ECG (personally reviewed): NSR, LVH with repolarization abnormalities, inferior Qs  [employer] device interrogation: Heartlogic score 1                Labs (4/21): K 3.7 => 3.4, creatinine 0.78 => 0.7, hgb 11.3 => 14.4 Labs (5/21): digoxin 0.6, K 3.5, creatinine 0.63 Labs (6/21): K 4.1, creatinine 0.7, LDL 61, TGs 247 Labs (7/21): K 5, creatinine 0.76, hgb 16.9.  Labs (10/21): K 4.6, creatinine 0.86 Labs (11/21): digoxin 0.9, LDL 45, HDL 27, K 4, creatinine 0.86 Labs (4/22): hgb 14.4, K 4.9, creatinine 0.81, digoxin 0.7  PMH: 1. CAD: CABG 2007.  - LHC in 2007 showed all grafts closed except LIMA-LAD.  Patient had DES to LCx/OM.   - [place] 2016 showed occlusion of DES to LCx/OM with L-L and L-R collaterals.  - Not candidate for redo CABG with poor targets.  2. Chronic systolic CHF: Ischemic cardiomyopathy.   - Echo (2/21): EF  20-25%, moderately dilated and moderately dysfunctional RV, PASP 49, severe biatrial enlargement, moderate-severe MR, severe TR, mild-moderate AS, IVC dilated.  - RHC (4/21): mean RA 6, PA 41/16, mean 29, mean PCWP 15, CI 2.12, PVR 4.3 WU - CPX (5/21): peak VO2 13, VE/VCO2 slope 37, RER 1.06 => moderate HF limitation.  - Echo (11/21): EF 35%, mild LVH.  - RHC (11/21): baseline CI 1.8, exercise peak VO2 12.2, PCWP up to 24 with exercise.  - Echo (5/22): EF 45-50%, basal-mid inferolateral and anterolateral akinesis, normal RV.  3. Fe deficiency anemia: FOBT negative.  4. Carotid stenosis: Right carotid stent in 1/19.  - Carotid dopplers (5/05): 39-76% LICA  stenosis.  - Carotid dopplers (7/34): 19-37% LICA stenosis.  5. H/o CVA 6. HTN 7. Type 2 diabetes 8. Hyperlipidemia 9. Seizure disorder 10. OSA: Cannot tolerate CPAP.  11. Active smoker: PFTs in 2019 were relatively normal.  She does use oxygen at night.  - PFTs (5/21): FEV1 82%, FVC 80%, ratio 102% => minimal obstruction.  12. PAD: ABIs (4/21) 0.76 on right, 0.86 on left => significant right SFA stenosis.  - Atherectomy/angioplasty right SFA.  - Peripheral arterial dopplers (2/22): 50-74% stenosis right SFA.  13. Chronic penetrating atherosclerotic ulcer descending thoracic aorta.   Social History   Socioeconomic History  . Marital status: Divorced    Spouse name: Not on file  . Number of children: 0  . Years of education: Not on file  . Highest education level: Not on file  Occupational History  . Occupation: disabled  Tobacco Use  . Smoking status: Current Every Day Smoker    Packs/day: 1.00    Years: 51.00    Pack years: 51.00    Types: Cigarettes    Start date: 33    Last attempt to quit: 02/22/2020    Years since quitting: 0.9  . Smokeless tobacco: Never Used  Vaping Use  . Vaping Use: Former  Substance and Sexual Activity  . Alcohol use: No  . Drug use: No  . Sexual activity: Not Currently    Partners: Male    Birth control/protection: None  Other Topics Concern  . Not on file  Social History Narrative   Right handed   Lives alone in a one story home   Social Determinants of Health   Financial Resource Strain: Not on file  Food Insecurity: No Food Insecurity  . Worried About Charity fundraiser in the Last Year: Never true  . Ran Out of Food in the Last Year: Never true  Transportation Needs: Unmet Transportation Needs  . Lack of Transportation (Medical): Yes  . Lack of Transportation (Non-Medical): No  Physical Activity: Not on file  Stress: Not on file  Social Connections: Not on file  Intimate Partner Violence: Not on file   Family History   Problem Relation Age of Onset  . Heart disease Mother   . Diabetes Mother   . COPD Mother   . Hyperlipidemia Mother   . Hypertension Mother   . Cancer Father        met, origin unknown  . Heart attack Father   . Drug abuse Paternal Grandmother   . Stroke Paternal Grandfather   . Lung cancer Paternal Aunt        lung with mets to brain  . Melanoma Paternal Uncle   . Lung cancer Paternal Uncle        lung/liver to brain  . Cancer Paternal Uncle  cancer of unknown type  . Heart disease Maternal Grandfather   . Hypertension Sister   . Cancer Sister        eyelid  . Glaucoma Sister   . Heart disease Maternal Aunt        x 2 aunts   ROS: All systems reviewed and negative except as per HPI.   Current Outpatient Medications  Medication Sig Dispense Refill  . acetaminophen (TYLENOL) 500 MG tablet Take 1,000 mg by mouth every 6 (six) hours as needed for moderate pain or headache.    . albuterol (VENTOLIN HFA) 108 (90 Base) MCG/ACT inhaler INHALE 2 PUFFS EVERY 6 HOURS AS NEEDED FOR WHEEZING/SHORTNESS OF BREATH 18 each 0  . aspirin EC 81 MG tablet Take 81 mg by mouth every evening.  30 tablet 6  . DEXILANT 60 MG capsule TAKE 1 CAPSULE DAILY 90 capsule 0  . diphenhydrAMINE (BENADRYL) 25 mg capsule Take 25 mg by mouth 2 (two) times daily as needed for itching or allergies.     . fluticasone (FLONASE) 50 MCG/ACT nasal spray Place 2 sprays into both nostrils daily. 48 g 1  . gabapentin (NEURONTIN) 300 MG capsule TAKE 2 CAPSULES BY MOUTH EVERY DAY AT BEDTIME 180 capsule 1  . glucose blood (ONETOUCH ULTRA) test strip 3 (three) times daily.    . Glycopyrrolate-Formoterol (BEVESPI AEROSPHERE) 9-4.8 MCG/ACT AERO Inhale 2 puffs into the lungs 2 (two) times daily. 32.1 g 1  . guaiFENesin (MUCINEX) 600 MG 12 hr tablet Take 2 tablets (1,200 mg total) by mouth 2 (two) times daily. 360 tablet 3  . guaiFENesin-codeine (ROBITUSSIN AC) 100-10 MG/5ML syrup Take 5 mLs by mouth 3 (three) times daily  as needed for cough. 120 mL 0  . HYDROcodone-acetaminophen (NORCO) 5-325 MG tablet Take 1 tablet by mouth daily as needed for severe pain. 15 tablet 0  . insulin aspart protamine - aspart (NOVOLOG MIX 70/30 FLEXPEN) (70-30) 100 UNIT/ML FlexPen 45uam/30upm    . iron polysaccharides (NIFEREX) 150 MG capsule Take 150 mg by mouth. 3 times per week    . KLOR-CON M20 20 MEQ tablet TAKE 1 TABLET TWICE A DAY 180 tablet 0  . levETIRAcetam (KEPPRA) 1000 MG tablet Take 1 tablet (1,000 mg total) by mouth 2 (two) times daily. 180 tablet 3  . metFORMIN (GLUCOPHAGE-XR) 500 MG 24 hr tablet Take 500 mg by mouth every evening.   6  . nicotine polacrilex (NICORETTE) 4 MG gum Take 1 each (4 mg total) by mouth as needed for smoking cessation. 90 tablet 2  . nitroGLYCERIN (NITROLINGUAL) 0.4 MG/SPRAY spray Place 1 spray under the tongue every 5 (five) minutes x 3 doses as needed for chest pain. 12 g 3  . NYSTATIN powder APPLY 1 APPLICATION        TOPICALLY 4 TIMES A DAY AS NEEDED FOR YEAST INFECTIONS 60 g 3  . ONE TOUCH ULTRA TEST test strip USE AS DIRECTED FOR TESTING BLOOD GLUCOSE 3 TIMES DAILY  1  . polyethylene glycol (MIRALAX / GLYCOLAX) 17 g packet Take 17 g by mouth daily.    . promethazine (PHENERGAN) 25 MG tablet Take 1 tablet (25 mg total) by mouth every 8 (eight) hours as needed for nausea or vomiting. Caution of sedation 30 tablet 1  . sodium chloride (OCEAN) 0.65 % SOLN nasal spray Place 1 spray into both nostrils as needed for congestion.    . clopidogrel (PLAVIX) 75 MG tablet Take 1 tablet (75 mg total) by mouth daily. [place]  tablet 3  . digoxin (LANOXIN) 0.125 MG tablet Take 0.5 tablets (0.0625 mg total) by mouth daily. 45 tablet 3  . ezetimibe (ZETIA) 10 MG tablet Take 1 tablet (10 mg total) by mouth daily. 90 tablet 3  . icosapent Ethyl (VASCEPA) 1 g capsule TAKE 2 CAPSULES BY MOUTH 2 TIMES DAILY. 360 capsule 3  . [START ON 02/20/2021] metolazone (ZAROXOLYN) 2.5 MG tablet Take 1 tablet (2.5 mg total) by  mouth 2 (two) times a week. Every Mon and Fri 30 tablet 3  . ranolazine (RANEXA) 1000 MG SR tablet Take 1 tablet (1,000 mg total) by mouth 2 (two) times daily. 180 tablet 3  . rosuvastatin (CRESTOR) 20 MG tablet Take 1 tablet (20 mg total) by mouth daily. 90 tablet 3  . spironolactone (ALDACTONE) 25 MG tablet TAKE 1 TABLET BY MOUTH EVERYDAY AT BEDTIME 90 tablet 3  . torsemide (DEMADEX) 20 MG tablet Take 3 tablets (60 mg total) by mouth every morning AND 2 tablets (40 mg total) every evening. 450 tablet 3  . Vericiguat 10 MG TABS TAKE 1 TABLET BY MOUTH ONCE DAILY 90 tablet 3   No current facility-administered medications for this encounter.   BP 126/70   Pulse 81   Ht '4\' 10"'  (1.473 m)   Wt 57.8 kg (127 lb 6.4 oz)   LMP 09/02/2003   SpO2 95%   BMI 26.63 kg/m  General: NAD Neck: No JVD, no thyromegaly or thyroid nodule.  Lungs: Clear to auscultation bilaterally with normal respiratory effort. CV: Nondisplaced PMI.  Heart regular S1/S2, no S3/S4, no murmur.  No peripheral edema.  No carotid bruit.  Unable to palpate pedal pulses.  Abdomen: Soft, nontender, no hepatosplenomegaly, no distention.  Skin: Intact without lesions or rashes.  Neurologic: Alert and oriented x 3.  Psych: Normal affect. Extremities: No clubbing or cyanosis.  HEENT: Normal. ]  Assessment/Plan:  1. Chronic systolic CHF: Ischemic cardiomyopathy, [employer] ICD.  Echo in 2/21 showed EF 20-25%, moderately dilated and moderately dysfunctional RV, PASP 49, severe biatrial enlargement, moderate-severe MR, severe TR, mild-moderate AS, IVC dilated. CPX (5/21) with moderate HF limitation, RHC (4/21) with CI low at 2.12.  Medication titration has been severely limited by hypotension and orthostatic symptoms.  Not a transplant candidate due to smoking. She was seen by cardiac surgery at [facility] and not thought to be good LVAD candidate due to small stature, small LV cavity, and redo sternotomy.  I sent her for a 2nd  opinion at [facility].  She had echo there in 11/21 with EF higher at 35% but CI low on RHC at 1.8, no LVAD planned based on higher EF.  Today, echo was done again and showed EF yet again increased to 45-50%.  NYHA class II symptoms, more limited by claudication than dyspnea.  She is not volume overloaded on exam.  - Continue digoxin 0.0625 mg daily, check level today.   - Continue torsemide 60 qam/40 qpm.  Continue metolazone 2.5 mg bid, Monday/Friday.  BMET today.  She does not feel like she can cut back on her diuretic regimen, says she develops edema if she misses any doses.  - Continue spironolactone 25 mg daily.  - She has not tolerated BP-active meds well, avoid Coreg and Entresto for now.  She became hypotensive with losartan.  - She did not tolerate SGLT2 inhibitor due to recurrent GU yeast infections.  - Continue vericiguat 10 mg daily.  - She does not qualify for the barostimulation trials due  to carotid disease.  - With higher EF, she is not a candidate for LVAD. 2. CAD: s/p CABG 0165 complicated by early graft closure.  Repeat cath in 2007 showed only LIMA-LAD still open and she had DES to LCX/OM.  Cath in 2016 showed this stent was occluded.  She has not had targets for redo CABG and she does not have good interventional targets. She has occasional atypical chest pain. - Continue ASA 81, Plavix 75 mg daily.  - Continue ranolazone 1000 bid.   - Continue Crestor 20 mg daily and Zetia 10 mg daily.  Good lipids in 11/21.  3. Smoking: Still smoking 1 ppd.  She is trying to quit.  - I recommended nicotine patches.  4. PAD: s/p atherectomy/angioplasty right SFA in 7/21.  Still with significant claudication. Peripheral arterial dopplers in 2/22 with 50-74% right SFA stenosis.  - Follows with Dr. Gwenlyn Found for PV.  5. Carotid stenosis: Moderate stenosis, repeat dopplers 6/22.   6. Fe deficiency anemia: Has been FOBT negative.  Has had Ferahema.  Most recent hgb looks stable.   Followup 4 months     Loralie Champagne 02/19/2021

## 2021-02-23 ENCOUNTER — Other Ambulatory Visit: Payer: Self-pay | Admitting: Family Medicine

## 2021-02-23 ENCOUNTER — Other Ambulatory Visit: Payer: Self-pay | Admitting: Neurology

## 2021-02-23 NOTE — Telephone Encounter (Signed)
Last filled on 11/02/20 #60 g with 3 refills, CPE was on 06/07/20

## 2021-02-23 NOTE — Telephone Encounter (Signed)
Enough given only till appt June 10, no further refills till seen.

## 2021-02-28 ENCOUNTER — Other Ambulatory Visit: Payer: Self-pay

## 2021-02-28 ENCOUNTER — Ambulatory Visit (HOSPITAL_COMMUNITY)
Admission: RE | Admit: 2021-02-28 | Discharge: 2021-02-28 | Disposition: A | Discharge status: Home / Self Care | Payer: Medicare Other | Source: Ambulatory Visit | Attending: Cardiology | Admitting: Cardiology

## 2021-02-28 DIAGNOSIS — I5042 Chronic combined systolic (congestive) and diastolic (congestive) heart failure: Secondary | ICD-10-CM | POA: Diagnosis not present

## 2021-02-28 DIAGNOSIS — I5022 Chronic systolic (congestive) heart failure: Secondary | ICD-10-CM | POA: Diagnosis not present

## 2021-02-28 DIAGNOSIS — J449 Chronic obstructive pulmonary disease, unspecified: Secondary | ICD-10-CM | POA: Diagnosis not present

## 2021-02-28 DIAGNOSIS — E119 Type 2 diabetes mellitus without complications: Secondary | ICD-10-CM | POA: Diagnosis not present

## 2021-02-28 LAB — DIGOXIN LEVEL: Digoxin Level: 0.9 ng/mL (ref 0.8–2.0)

## 2021-03-07 ENCOUNTER — Other Ambulatory Visit: Payer: Self-pay | Admitting: Pulmonary Disease

## 2021-03-07 DIAGNOSIS — J9611 Chronic respiratory failure with hypoxia: Secondary | ICD-10-CM

## 2021-03-08 ENCOUNTER — Other Ambulatory Visit (HOSPITAL_COMMUNITY): Payer: Self-pay | Admitting: Cardiovascular Disease

## 2021-03-08 ENCOUNTER — Ambulatory Visit (HOSPITAL_COMMUNITY)
Admission: RE | Admit: 2021-03-08 | Discharge: 2021-03-08 | Disposition: A | Discharge status: Home / Self Care | Payer: Medicare Other | Source: Ambulatory Visit | Attending: Cardiovascular Disease | Admitting: Cardiovascular Disease

## 2021-03-08 ENCOUNTER — Other Ambulatory Visit: Payer: Self-pay

## 2021-03-08 DIAGNOSIS — E785 Hyperlipidemia, unspecified: Secondary | ICD-10-CM | POA: Insufficient documentation

## 2021-03-08 DIAGNOSIS — E1169 Type 2 diabetes mellitus with other specified complication: Secondary | ICD-10-CM | POA: Insufficient documentation

## 2021-03-08 DIAGNOSIS — I6523 Occlusion and stenosis of bilateral carotid arteries: Secondary | ICD-10-CM | POA: Insufficient documentation

## 2021-03-08 DIAGNOSIS — E7800 Pure hypercholesterolemia, unspecified: Secondary | ICD-10-CM | POA: Insufficient documentation

## 2021-03-08 DIAGNOSIS — Z95828 Presence of other vascular implants and grafts: Secondary | ICD-10-CM

## 2021-03-09 IMAGING — DX DG CHEST 2V
2 series · 2 of 2 positions shown · non-contrast
Comparison: 12/25/2019

CLINICAL DATA: LVAD evaluation.

EXAM:
CHEST - 2 VIEW

[chest pa]
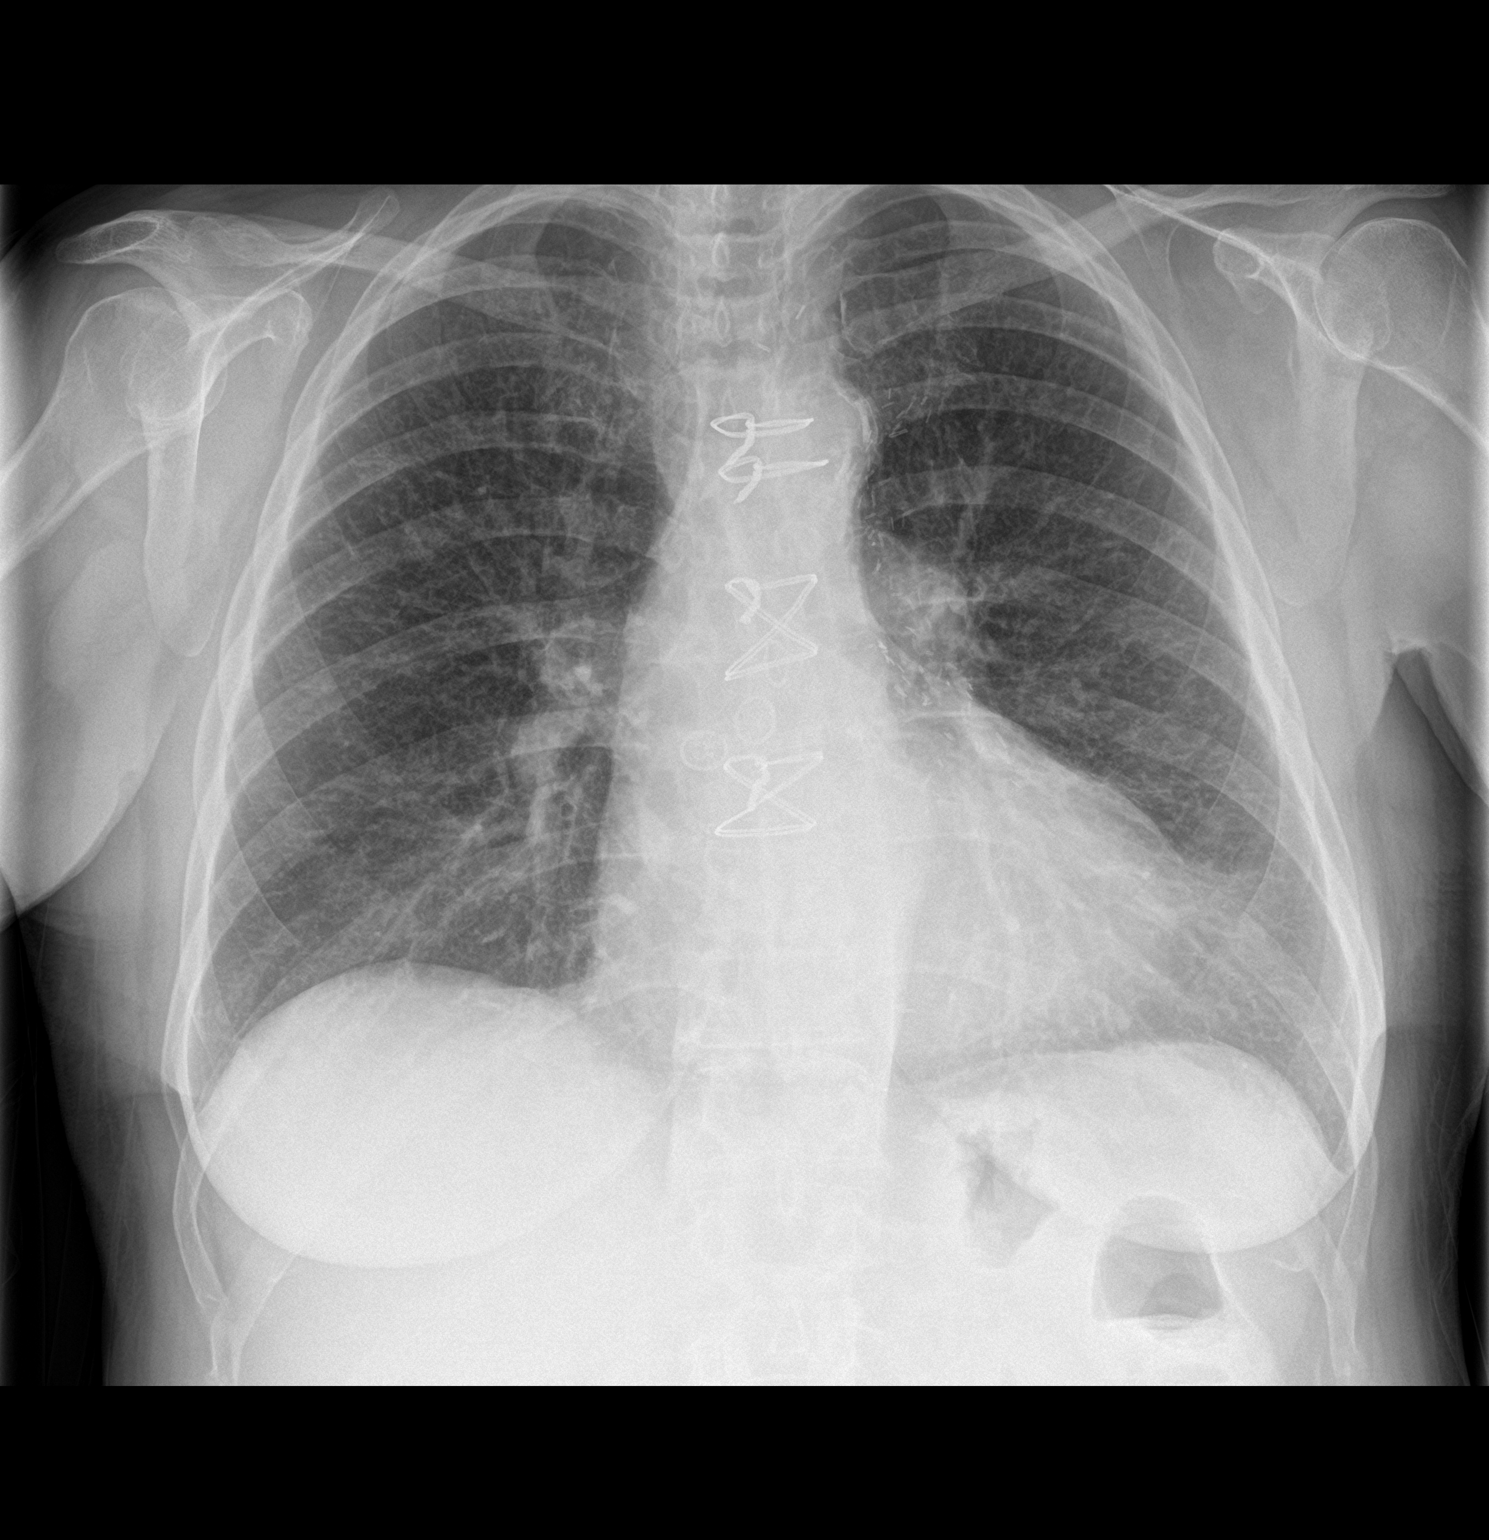

[chest lat]
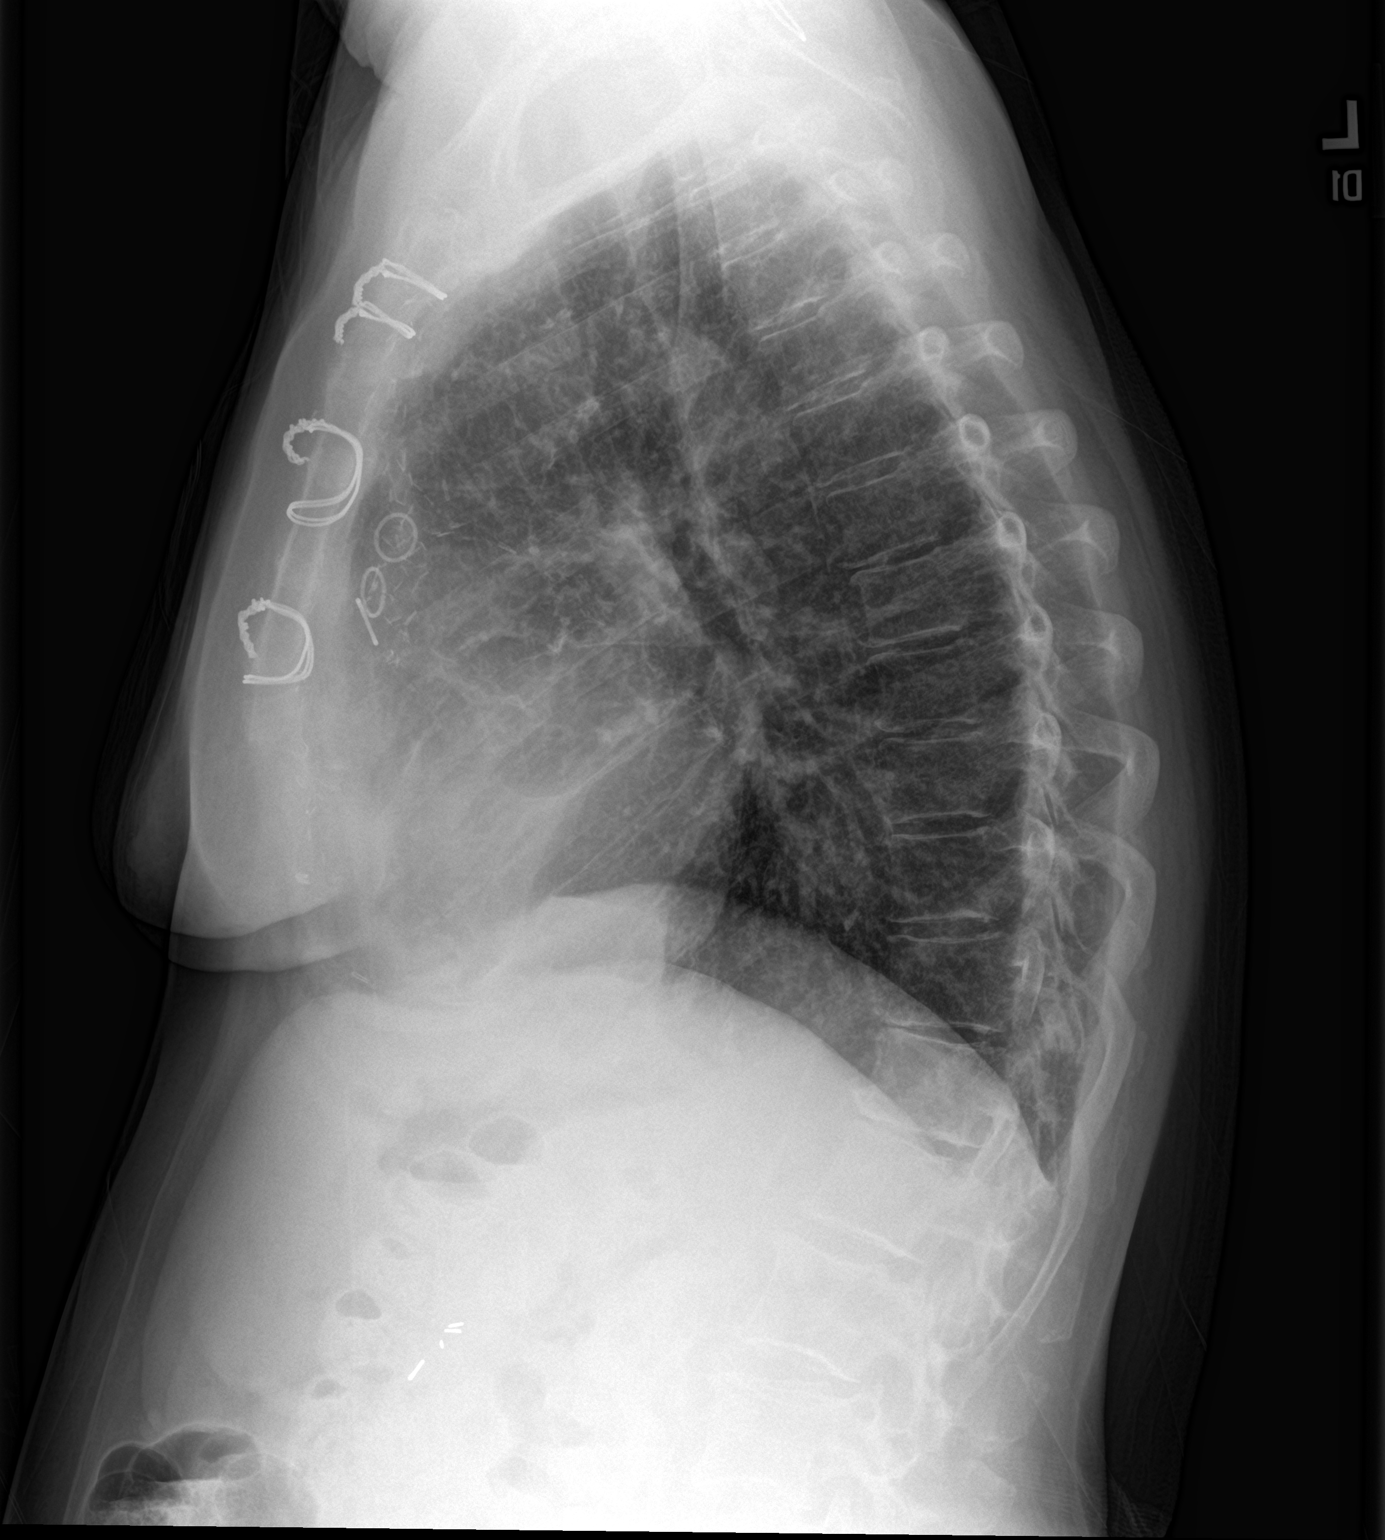

[2 of 2 positions shown; findings below may reference images not displayed]

FINDINGS: Previous median sternotomy and CABG procedure. Mild cardiac
enlargement. No pleural effusions. Pulmonary vascular congestion
noted. No airspace opacities identified.
IMPRESSION: Mild cardiac enlargement and pulmonary vascular congestion

## 2021-03-09 NOTE — Progress Notes (Signed)
NEUROLOGY FOLLOW UP OFFICE NOTE  Diane [name] [id number]  Assessment/Plan:    Seizure disorder, stable  Cervical radiculopathy, stable  1. Seizure prophylaxis:  Keppra 1060m BID filled 2. Neuropathic pain:  gabapentin 6029mQHS filled 3. Follow up one year  Subjective:  Diane [name] a [age]ear old right-handed woman with CAD s/p CABG, hypertension, hyperlipidemia, type 2 diabetes mellitus, tobacco use, depression, fibromyalgia, OSA, COPD and history of stroke who follows up for seizure disorder.   UPDATE: Current medications:  Keppra 100043mwice daily, gabapentin 600m68m bedtime (for neck pain)   No spells.  Last spell 03/18/13.   Her main issue is the chronic dizziness.  She feels unsure of herself walking without the walker.  2 months ago, [facility] and [facility].  Blood sugar was okay.  She was not able to check her blood pressure.     HISTORY: 1.  Seizure disorder: [facility]13 for recurrent seizure-like episodes.  These spells are were witnessed by Dr. CharWallie Charuro-hospitalist, and described as "head turning to the right as well as eye deviation to the right, stiffening of right extremities with flexion of right upper extremity, and convulsive movements of right extremities. Spell lasted about 40-50 seconds and resolved without intervention. Within 30 seconds patient was again awake and conversant. She was slightly confused with respect to time."  She is unable to speak during these events.  Sometimes she was aware during these events and other times she was unaware and amnestic.  She did have urinary incontinence but no tongue biting.  It is reported that she can write down what she wants to say during these spells.  Given her stroke history, she was evaluated for possible left hemispheric stroke.  By his assessment, it seemed that her speech difficulties were non-organic,  seizure-like activity non-organic, and he did not suspect acute stroke.     She had continuous EEG monitoring, which captured multiple spells without electrographic correlate.  However, she did have one captured clinical event, described as "build up to rhythmical theta activity and resultant slowing companied by clinical changes.  This may very well represent epileptiform activity.  No interictal abnormalities were noted".  Psychiatry was consulted, regarding possible pseudoseizures.  Despite the possible diagnosis of pseudoseizures, the neuro-hospitalists never made a sole diagnosis of pseudoseizures in the hospital notes.     I personally reviewed the EEG.  At 1:57:48, patient pauses, head slightly turned left, staring, with little movement and no convulsions.  She does pull at the covers a little, but no appreciable stereotypical hand movements.  When it is over, she begins moving around again.  Electrographically, she does have a build up of generalized theta activity up until 1:58:26, followed by generalized delta slowing lasting until 1:58:45.  Difficult to lateralize, but may be more prominent in the left hemisphere.  However, this appears to only be a clip of the event and the rest of the study is not available to me.  Therefore I cannot comment on the other captured spells.   She also reports episodes of tremor involving the head, hands and legs.  It is not positional.  It lasts 20 to 30 minutes.  She has had it for many years.  Blood pressure and blood sugar readings are normal.  She says her sister has Parkinson's disease.    Chronic dizziness.   MRI of brain performed on  03/03/13, which reported possible tiny acute infarct in the posterior left frontal lobe, but neurology believed this to be artifact.  There was also mention of incidental tiny cysts in right hippocampal region.  Encephalomalacia in the right frontoparietal region, from prior remote stroke, is seen.  MRI of brain without contrast  from 12/08/15 showed chronic right MCA infarct but also showed chronic looking lacunar infarcts in the bilateral basal ganglia and left thalamus, which are new compared to prior imaging from June 2014.   Prior AEDs:  Dilantin ER 175m   2.  Cervical radiculopathy: She has history of right sided neck pain radiating down to the right shoulder, associated with tingling from the right ear down to the shoulder.  Hot and cold compresses help too.  No numbness, pain or weakness down the arm.  Pain worse when turning neck to the left.  She has trigger finger and numbness in the fingers when driving or picking up objects, thought to be carpal tunnel syndrome.  She has history of cervical fusion in the 1980s.  PT was ineffective.  .[name] MRI of the cervical spine performed on 05/23/14 showed moderately large foraminal disc protrusion and associated osteophyte at C3-4, causing right foraminal encroachment.  Solid fusion C5 through C7 noted.  She was referred for right C4 nerve root injection, however it was not done.    PAST MEDICAL HISTORY: Past Medical History:  Diagnosis Date   Anemia    Carotid stenosis    a. s/p right carotid stent 10/2017.   Chronic systolic CHF (congestive heart failure) (HCC)    Colon polyps    COPD (chronic obstructive pulmonary disease) (HCC)    Coronary artery disease    post CABG in 3/07 , coronary stents    Diabetes mellitus    type 2   Dyslipidemia    Fibromyalgia    GERD (gastroesophageal reflux disease)    Headache    hx of    Hyperlipidemia    Hypertension    Myocardial infarction ([facility]    Pneumonia    hx of    [place]    last seizure- 03/2013    Shortness of breath dyspnea    with exertion or when fluid builds up    Sleep apnea    used to wear a cpap- not used in 3 years    Status post dilation of esophageal narrowing    Stroke ([facility] 1993   problems with balance    Systolic murmur    known mild AS and MR   Thyroid goiter    Tobacco abuse      MEDICATIONS: Current Outpatient Medications on File Prior to Visit  Medication Sig Dispense Refill   acetaminophen (TYLENOL) 500 MG tablet Take 1,000 mg by mouth every 6 (six) hours as needed for moderate pain or headache.     albuterol (VENTOLIN HFA) 108 (90 Base) MCG/ACT inhaler INHALE 2 PUFFS EVERY 6 HOURS AS NEEDED FOR WHEEZING/SHORTNESS OF BREATH 18 each 0   aspirin EC 81 MG tablet Take 81 mg by mouth every evening.  30 tablet 6   clopidogrel (PLAVIX) 75 MG tablet Take 1 tablet (75 mg total) by mouth daily. 90 tablet 3   DEXILANT 60 MG capsule TAKE 1 CAPSULE DAILY 90 capsule 0   digoxin (LANOXIN) 0.125 MG tablet Take 0.5 tablets (0.0625 mg total) by mouth daily. 45 tablet 3   diphenhydrAMINE (BENADRYL) 25 mg capsule Take 25 mg by mouth 2 (two) times daily as  needed for itching or allergies.      ezetimibe (ZETIA) 10 MG tablet Take 1 tablet (10 mg total) by mouth daily. 90 tablet 3   fluticasone (FLONASE) 50 MCG/ACT nasal spray Place 2 sprays into both nostrils daily. 48 g 1   gabapentin (NEURONTIN) 300 MG capsule TAKE 2 CAPSULES BY MOUTH EVERY DAY AT BEDTIME 180 capsule 1   glucose blood (ONETOUCH ULTRA) test strip 3 (three) times daily.     Glycopyrrolate-Formoterol (BEVESPI AEROSPHERE) 9-4.8 MCG/ACT AERO Inhale 2 puffs into the lungs 2 (two) times daily. 32.1 g 1   guaiFENesin (MUCINEX) 600 MG 12 hr tablet Take 2 tablets (1,200 mg total) by mouth 2 (two) times daily. 360 tablet 3   guaiFENesin-codeine (ROBITUSSIN AC) 100-10 MG/5ML syrup Take 5 mLs by mouth 3 (three) times daily as needed for cough. 120 mL 0   HYDROcodone-acetaminophen (NORCO) 5-325 MG tablet Take 1 tablet by mouth daily as needed for severe pain. 15 tablet 0   icosapent Ethyl (VASCEPA) 1 g capsule TAKE 2 CAPSULES BY MOUTH 2 TIMES DAILY. 360 capsule 3   insulin aspart protamine - aspart (NOVOLOG MIX 70/30 FLEXPEN) (70-30) 100 UNIT/ML FlexPen 45uam/30upm     iron polysaccharides (NIFEREX) 150 MG capsule Take 150 mg by  mouth. 3 times per week     KLOR-CON M20 20 MEQ tablet TAKE 1 TABLET TWICE A DAY 180 tablet 0   levETIRAcetam (KEPPRA) 1000 MG tablet TAKE 1 TABLET TWICE A DAY 32 tablet 0   metFORMIN (GLUCOPHAGE-XR) 500 MG 24 hr tablet Take 500 mg by mouth every evening.   6   metolazone (ZAROXOLYN) 2.5 MG tablet Take 1 tablet (2.5 mg total) by mouth 2 (two) times a week. Every Mon and Fri 30 tablet 3   nicotine polacrilex (NICORETTE) 4 MG gum Take 1 each (4 mg total) by mouth as needed for smoking cessation. 90 tablet 2   nitroGLYCERIN (NITROLINGUAL) 0.4 MG/SPRAY spray Place 1 spray under the tongue every 5 (five) minutes x 3 doses as needed for chest pain. 12 g 3   nystatin (MYCOSTATIN/NYSTOP) powder APPLY 1 APPLICATION        TOPICALLY 4 TIMES A DAY AS NEEDED FOR YEAST INFECTIONS 60 g 5   ONE TOUCH ULTRA TEST test strip USE AS DIRECTED FOR TESTING BLOOD GLUCOSE 3 TIMES DAILY  1   polyethylene glycol (MIRALAX / GLYCOLAX) 17 g packet Take 17 g by mouth daily.     promethazine (PHENERGAN) 25 MG tablet Take 1 tablet (25 mg total) by mouth every 8 (eight) hours as needed for nausea or vomiting. Caution of sedation 30 tablet 1   ranolazine (RANEXA) 1000 MG SR tablet Take 1 tablet (1,000 mg total) by mouth 2 (two) times daily. 180 tablet 3   rosuvastatin (CRESTOR) 20 MG tablet Take 1 tablet (20 mg total) by mouth daily. 90 tablet 3   sodium chloride (OCEAN) 0.65 % SOLN nasal spray Place 1 spray into both nostrils as needed for congestion.     spironolactone (ALDACTONE) 25 MG tablet TAKE 1 TABLET BY MOUTH EVERYDAY AT BEDTIME 90 tablet 3   torsemide (DEMADEX) 20 MG tablet Take 3 tablets (60 mg total) by mouth every morning AND 2 tablets (40 mg total) every evening. 450 tablet 3   Vericiguat 10 MG TABS TAKE 1 TABLET BY MOUTH ONCE DAILY 90 tablet 3   No current facility-administered medications on file prior to visit.    ALLERGIES: Allergies  Allergen Reactions   Bee  Venom Itching and Swelling   Benadryl  [Diphenhydramine] Other (See Comments)    Insomnia    Entresto [Sacubitril-Valsartan] Cough   Farxiga [Dapagliflozin]     Yeast infection   Tetracycline     Unknown, pt cannot recall exact reaction   Canagliflozin Other (See Comments)   Other Other (See Comments)   Sitagliptin Other (See Comments)   Ace Inhibitors Cough    FAMILY HISTORY: Family History  Problem Relation Age of Onset   Heart disease Mother    Diabetes Mother    COPD Mother    Hyperlipidemia Mother    Hypertension Mother    Cancer Father        met, origin unknown   Heart attack Father    Drug abuse Paternal Grandmother    Stroke Paternal Grandfather    Lung cancer Paternal Aunt        lung with mets to brain   Melanoma Paternal Uncle    Lung cancer Paternal Uncle        lung/liver to brain   Cancer Paternal Uncle        cancer of unknown type   Heart disease Maternal Grandfather    Hypertension Sister    Cancer Sister        eyelid   Glaucoma Sister    Heart disease Maternal Aunt        x 2 aunts      Objective:  Blood pressure 126/72, pulse 84, height 4' 11" (1.499 m), weight 120 lb (54.4 kg), last menstrual period 09/02/2003, SpO2 97 %. General: No acute distress.  Patient appears well-groomed.   Head:  Normocephalic/atraumatic Eyes:  Fundi examined but not visualized Neck: supple, no paraspinal tenderness, full range of motion Heart:  Regular rate and rhythm Lungs:  Clear to auscultation bilaterally Back: No paraspinal tenderness Neurological Exam: alert and oriented to person, place, and time.  Speech fluent and not dysarthric, language intact.  CN II-XII intact. Bulk and tone normal, muscle strength 5/5 throughout.  Sensation to pinprick and vibration intact.  Deep tendon reflexes 2+ throughout.  Finger to nose testing intact.  Gait normal.   Metta Clines, DO  CC: Loura Pardon, MD

## 2021-03-10 ENCOUNTER — Other Ambulatory Visit (HOSPITAL_COMMUNITY): Payer: Self-pay

## 2021-03-10 ENCOUNTER — Other Ambulatory Visit: Payer: Self-pay

## 2021-03-10 ENCOUNTER — Ambulatory Visit (INDEPENDENT_AMBULATORY_CARE_PROVIDER_SITE_OTHER): Payer: Medicare Other | Admitting: Neurology

## 2021-03-10 VITALS — BP 126/72 | HR 84 | Ht 59.0 in | Wt 120.0 lb

## 2021-03-10 DIAGNOSIS — I255 Ischemic cardiomyopathy: Secondary | ICD-10-CM

## 2021-03-10 DIAGNOSIS — M5010 Cervical disc disorder with radiculopathy, unspecified cervical region: Secondary | ICD-10-CM

## 2021-03-10 DIAGNOSIS — G40009 Localization-related (focal) (partial) idiopathic epilepsy and epileptic syndromes with seizures of localized onset, not intractable, without status epilepticus: Secondary | ICD-10-CM

## 2021-03-10 MED ORDER — LEVETIRACETAM 1000 MG PO TABS: 1000.0000 mg | ORAL_TABLET | Freq: Two times a day (BID) | ORAL | 1 refills | Status: DC

## 2021-03-10 MED ORDER — GABAPENTIN 300 MG PO CAPS: ORAL_CAPSULE | ORAL | 1 refills | Status: DC

## 2021-03-10 NOTE — Patient Instructions (Signed)
Continue gabapentin 600mg  at bedtime Continue levetiracetam 1000mg  twice daily Both refilled

## 2021-03-25 ENCOUNTER — Other Ambulatory Visit: Payer: Self-pay | Admitting: Cardiology

## 2021-03-30 DIAGNOSIS — J449 Chronic obstructive pulmonary disease, unspecified: Secondary | ICD-10-CM | POA: Diagnosis not present

## 2021-03-30 DIAGNOSIS — E119 Type 2 diabetes mellitus without complications: Secondary | ICD-10-CM | POA: Diagnosis not present

## 2021-03-30 DIAGNOSIS — I5042 Chronic combined systolic (congestive) and diastolic (congestive) heart failure: Secondary | ICD-10-CM | POA: Diagnosis not present

## 2021-03-31 DIAGNOSIS — Z20822 Contact with and (suspected) exposure to covid-19: Secondary | ICD-10-CM | POA: Diagnosis not present

## 2021-04-05 ENCOUNTER — Other Ambulatory Visit: Payer: Self-pay | Admitting: Pulmonary Disease

## 2021-04-05 DIAGNOSIS — J9611 Chronic respiratory failure with hypoxia: Secondary | ICD-10-CM

## 2021-04-09 ENCOUNTER — Other Ambulatory Visit: Payer: Self-pay | Admitting: Family Medicine

## 2021-04-20 DIAGNOSIS — E7801 Familial hypercholesterolemia: Secondary | ICD-10-CM | POA: Diagnosis not present

## 2021-04-20 DIAGNOSIS — E041 Nontoxic single thyroid nodule: Secondary | ICD-10-CM | POA: Diagnosis not present

## 2021-04-20 DIAGNOSIS — E119 Type 2 diabetes mellitus without complications: Secondary | ICD-10-CM | POA: Diagnosis not present

## 2021-04-25 ENCOUNTER — Ambulatory Visit (INDEPENDENT_AMBULATORY_CARE_PROVIDER_SITE_OTHER): Payer: Medicare Other

## 2021-04-25 DIAGNOSIS — I255 Ischemic cardiomyopathy: Secondary | ICD-10-CM | POA: Diagnosis not present

## 2021-04-25 LAB — CUP PACEART REMOTE DEVICE CHECK
Battery Remaining Longevity: 174 mo
Battery Remaining Percentage: 100 %
Brady Statistic RV Percent Paced: 0 %
Date Time Interrogation Session: 20220726041100
HighPow Impedance: 79 Ohm
Implantable Lead Implant Date: 20211015
Implantable Lead Location: 753860
Implantable Lead Model: 672
Implantable Lead Serial Number: 166771
Implantable Pulse Generator Implant Date: 20211015
Lead Channel Impedance Value: 1029 Ohm
Lead Channel Setting Pacing Amplitude: 3.5 V
Lead Channel Setting Pacing Pulse Width: 0.4 ms
Lead Channel Setting Sensing Sensitivity: 0.5 mV
Pulse Gen Serial Number: 279742

## 2021-04-30 DIAGNOSIS — J449 Chronic obstructive pulmonary disease, unspecified: Secondary | ICD-10-CM | POA: Diagnosis not present

## 2021-04-30 DIAGNOSIS — E119 Type 2 diabetes mellitus without complications: Secondary | ICD-10-CM | POA: Diagnosis not present

## 2021-04-30 DIAGNOSIS — I5042 Chronic combined systolic (congestive) and diastolic (congestive) heart failure: Secondary | ICD-10-CM | POA: Diagnosis not present

## 2021-05-01 ENCOUNTER — Ambulatory Visit (INDEPENDENT_AMBULATORY_CARE_PROVIDER_SITE_OTHER): Payer: Medicare Other | Admitting: Family Medicine

## 2021-05-01 ENCOUNTER — Other Ambulatory Visit: Payer: Self-pay

## 2021-05-01 ENCOUNTER — Encounter: Payer: Self-pay | Admitting: Family Medicine

## 2021-05-01 VITALS — BP 108/64 | HR 85 | Temp 98.1°F | Ht 59.0 in | Wt 125.3 lb

## 2021-05-01 DIAGNOSIS — R238 Other skin changes: Secondary | ICD-10-CM | POA: Diagnosis not present

## 2021-05-01 DIAGNOSIS — L299 Pruritus, unspecified: Secondary | ICD-10-CM | POA: Diagnosis not present

## 2021-05-01 DIAGNOSIS — I255 Ischemic cardiomyopathy: Secondary | ICD-10-CM

## 2021-05-01 MED ORDER — TRIAMCINOLONE ACETONIDE 0.5 % EX CREA: 1.0000 "application " | TOPICAL_CREAM | Freq: Three times a day (TID) | CUTANEOUS | 0 refills | Status: DC

## 2021-05-01 NOTE — Progress Notes (Signed)
Subjective:    Patient ID: Diane [name], female    DOB: [birth year], 65 y.o.   MRN: [id number]  This visit occurred during the SARS-CoV-2 public health emergency.  Safety protocols were in place, including screening questions prior to the visit, additional usage of staff PPE, and extensive cleaning of exam room while observing appropriate contact time as indicated for disinfecting solutions.   HPI Pt presents with rash /itching   Wt Readings from Last 3 Encounters:  05/01/21 125 lb 5 oz (56.8 kg)  03/10/21 120 lb (54.4 kg)  02/17/21 127 lb 6.4 oz (57.8 kg)   25.31 kg/m  Started just over 1 1/2 weeks ago  Itchy spot R inner ankle  ? Bug bite Then more and more of then  Over and under under clothes  Very itchy   Benadryl helps but it keeps her awake Allergic to dove and ivory (uses a scented soap for years)  Avoids hot water  Sits on porch No known tick bites Dog in house -no fleas at all No recent travel or exp to bed bugs   No new meds or exposures  No new foods   Patient Active Problem List   Diagnosis Date Noted   Papules 05/01/2021   Pruritus 05/01/2021   ASCUS with positive high risk HPV cervical 06/14/2020   Chronic pain of right lower extremity 05/16/2020   Right knee pain 05/16/2020   Right groin pain 05/16/2020   Atherosclerotic vascular disease 03/03/2020   Healthcare maintenance 12/25/2019   Shortness of breath 12/25/2019   Dysphagia 12/09/2019   Iron deficiency anemia 11/05/2019   Coronary artery disease of bypass graft of native heart with stable angina pectoris ([place]) 11/02/2019   Visit for routine gyn exam 04/23/2019   Generalized weakness 03/24/2019   Occult blood in stools    Positive colorectal cancer screening using DNA-based stool test 12/22/2018   Anemia 12/22/2018   Fatigue 12/05/2018   Estrogen deficiency 04/21/2018   Screening mammogram, encounter for 04/21/2018   Osteopenia 04/21/2018   History of HPV infection 04/21/2018   Dizzy  spells 03/03/2018   Ischemic cardiomyopathy 01/21/2018   Other constipation 05/07/2017   Colon cancer screening 04/19/2017   Incisional hernia 01/14/2017   Smokers' cough ([place]) 08/13/2016   Chronic respiratory failure ([place]) 07/11/2016   Sleep-related hypoventilation due to lower airway obstruction 05/20/2016   Periodic limb movements of sleep 40/98/1191   Chronic systolic congestive heart failure ([place]) 05/08/2016   Type 2 diabetes mellitus with diabetic polyneuropathy, with long-term current use of insulin ([place]) 12/13/2015   B12 deficiency 12/01/2015   History of CVA (cerebrovascular accident) 11/29/2015   Abnormal nuclear stress test 05/06/2015   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 of localized onset, not intractable, without status epilepticus ([place]) 03/22/2015   Cervical disc disorder with radiculopathy of cervical region 03/22/2015   Complex partial seizures ([place]) 09/16/2013   Encounter for Medicare annual wellness exam 06/16/2013   Antiplatelet or antithrombotic long-term use 06/08/2013   Seizures ([place]) 03/25/2013   Goiter 09/01/2012   Other screening mammogram 06/13/2012   Allergic rhinitis 06/13/2012   Carotid artery disease ([place]) 08/20/2011   Obstructive sleep apnea 09/18/2007   TOBACCO USE 08/08/2007   Hyperlipidemia associated with type 2 diabetes mellitus ([place]) 03/26/2007   ANXIETY 03/26/2007   DEPRESSION 03/26/2007   Migraine headache 03/26/2007   CARPAL TUNNEL SYNDROME, BILATERAL 03/26/2007   Essential hypertension 03/26/2007   Hx of CABG 03/26/2007   GERD 03/26/2007  Claudication in peripheral vascular disease ([place]) 03/26/2007   Past Medical History:  Diagnosis Date   Anemia    Carotid stenosis    a. s/p right carotid stent 10/2017.   Chronic systolic CHF (congestive heart failure) (HCC)    Colon polyps    COPD (chronic obstructive pulmonary disease) (HCC)    Coronary artery disease    post CABG in 3/07 , coronary  stents    Diabetes mellitus    type 2   Dyslipidemia    Fibromyalgia    GERD (gastroesophageal reflux disease)    Headache    hx of    Hyperlipidemia    Hypertension    Myocardial infarction ([place])    Pneumonia    hx of    Seizures ([place])    last seizure- 03/2013    Shortness of breath dyspnea    with exertion or when fluid builds up    Sleep apnea    used to wear a cpap- not used in 3 years    Status post dilation of esophageal narrowing    Stroke [facility]) 1993   problems with balance    Systolic murmur    known mild AS and MR   Thyroid goiter    Tobacco abuse    Past Surgical History:  Procedure Laterality Date   ABDOMINAL AORTOGRAM W/LOWER EXTREMITY N/A 04/18/2020   Procedure: ABDOMINAL AORTOGRAM W/LOWER EXTREMITY;  Surgeon: Lorretta Harp, MD;  Location: [facility];  Service: Cardiovascular;  Laterality: N/A;   APPENDECTOMY     BIOPSY  02/03/2019   Procedure: BIOPSY;  Surgeon: Ladene Artist, MD;  Location: [facility];  Service: Endoscopy;;   BREAST BIOPSY Left 2016   CARDIAC CATHETERIZATION  11/29/05   EF of 55%   CARDIAC CATHETERIZATION  08/06/06   EF of 45-50%   CARDIAC CATHETERIZATION N/A 05/06/2015   Procedure: Right/Left Heart Cath and Coronary/Graft Angiography;  Surgeon: Peter M Martinique, MD;  Location: [facility];  Service: Cardiovascular;  Laterality: N/A;   CAROTID PTA/STENT INTERVENTION N/A 10/10/2017   Procedure: CAROTID PTA/STENT INTERVENTION - Right;  Surgeon: Serafina Mitchell, MD;  Location: [facility];  Service: Cardiovascular;  Laterality: N/A;   CERVICAL FUSION  1990   CHOLECYSTECTOMY     COLON RESECTION     mass removed and 4 in of colon   COLONOSCOPY WITH PROPOFOL N/A 07/16/2017   Procedure: COLONOSCOPY WITH PROPOFOL;  Surgeon: Ladene Artist, MD;  [facility];  Service: Endoscopy;  Laterality: N/A;   CORONARY ARTERY BYPASS GRAFT  12/04/2005   x5 -- left internal mammary artery to the LAD, left radial artery to the  ramus intermedius, saphenous vein graft to the obtuse marginal 1, sequential saphenous vein grat to the acute marginal and posterior descending, endoscopic vein harvesting from the left thigh with open vein harvest from right leg   CORONARY STENT PLACEMENT  08/11/06   PCI of her ciurcumflex/OM vessel   ESOPHAGOGASTRODUODENOSCOPY (EGD) WITH PROPOFOL N/A 02/03/2019   Procedure: ESOPHAGOGASTRODUODENOSCOPY (EGD) WITH PROPOFOL;  Surgeon: Ladene Artist, MD;  [facility];  Service: Endoscopy;  Laterality: N/A;   ICD IMPLANT N/A 07/15/2020   Procedure: ICD IMPLANT;  Surgeon: Vickie Epley, MD;  Location: [facility];  Service: Cardiovascular;  Laterality: N/A;   LAPAROTOMY Bilateral 05/19/2015   Procedure: EXPLORATORY LAPAROTOMY WITH BILATERAL SALPINGO OOPHORECTOMY /OMENTECTOMY/SEGMENTAL SIGMOID COLECTOMY ;  Surgeon: Everitt Amber, MD;  Location: [facility];  Service: Gynecology;  Laterality: Bilateral;   PERIPHERAL VASCULAR BALLOON ANGIOPLASTY  04/18/2020   Procedure: PERIPHERAL VASCULAR BALLOON ANGIOPLASTY;  Surgeon: Lorretta Harp, MD;  Location: [facility];  Service: Cardiovascular;;  rt SFA  atherectomy and DCB   PERIPHERAL VASCULAR CATHETERIZATION N/A 11/15/2015   Procedure: Carotid PTA/Stent Intervention;  Surgeon: Lorretta Harp, MD;  Location: [facility];  Service: Cardiovascular;  Laterality: N/A;   PERIPHERAL VASCULAR CATHETERIZATION  11/15/2015   Procedure: Carotid Angiography;  Surgeon: Lorretta Harp, MD;  Location: [facility];  Service: Cardiovascular;;   RIGHT HEART CATH N/A 01/27/2020   Procedure: RIGHT HEART CATH;  Surgeon: Larey Dresser, MD;  Location: [facility];  Service: Cardiovascular;  Laterality: N/A;   Social History   Tobacco Use   Smoking status: Every Day    Packs/day: 1.00    Years: 51.00    Pack years: 51.00    Types: Cigarettes    Start date: 23    Last attempt to quit: 02/22/2020    Years since quitting: 1.1    Smokeless tobacco: Never  Vaping Use   Vaping Use: Former  Substance Use Topics   Alcohol use: No   Drug use: No   Family History  Problem Relation Age of Onset   Heart disease Mother    Diabetes Mother    COPD Mother    Hyperlipidemia Mother    Hypertension Mother    Cancer Father        met, origin unknown   Heart attack Father    Drug abuse Paternal Grandmother    Stroke Paternal Grandfather    Lung cancer Paternal Aunt        lung with mets to brain   Melanoma Paternal Uncle    Lung cancer Paternal Uncle        lung/liver to brain   Cancer Paternal Uncle        cancer of unknown type   Heart disease Maternal Grandfather    Hypertension Sister    Cancer Sister        eyelid   Glaucoma Sister    Heart disease Maternal Aunt        x 2 aunts   Allergies  Allergen Reactions   Bee Venom Itching and Swelling   Benadryl [Diphenhydramine] Other (See Comments)    Insomnia    Entresto [Sacubitril-Valsartan] Cough   Farxiga [Dapagliflozin]     Yeast infection   Tetracycline     Unknown, pt cannot recall exact reaction   Canagliflozin Other (See Comments)   Other Other (See Comments)   Sitagliptin Other (See Comments)   Ace Inhibitors Cough   Current Outpatient Medications on File Prior to Visit  Medication Sig Dispense Refill   acetaminophen (TYLENOL) 500 MG tablet Take 1,000 mg by mouth every 6 (six) hours as needed for moderate pain or headache.     albuterol (VENTOLIN HFA) 108 (90 Base) MCG/ACT inhaler INHALE 2 PUFFS EVERY 6 HOURS AS NEEDED FOR WHEEZING/SHORTNESS OF BREATH 18 each 0   aspirin EC 81 MG tablet Take 81 mg by mouth every evening.  30 tablet 6   clopidogrel (PLAVIX) 75 MG tablet Take 1 tablet (75 mg total) by mouth daily. 90 tablet 3   DEXILANT 60 MG capsule TAKE 1 CAPSULE DAILY 90 capsule 0   digoxin (LANOXIN) 0.125 MG tablet Take 0.5 tablets (0.0625 mg total) by mouth daily. 45 tablet 3   diphenhydrAMINE (BENADRYL) 25 mg capsule Take  25 mg by mouth  2 (two) times daily as needed for itching or allergies.      ezetimibe (ZETIA) 10 MG tablet Take 1 tablet (10 mg total) by mouth daily. 90 tablet 3   fluticasone (FLONASE) 50 MCG/ACT nasal spray Place 2 sprays into both nostrils daily. 48 g 1   gabapentin (NEURONTIN) 300 MG capsule TAKE 2 CAPSULES BY MOUTH EVERY DAY AT BEDTIME 180 capsule 1   glucose blood (ONETOUCH ULTRA) test strip 3 (three) times daily.     guaiFENesin (MUCINEX) 600 MG 12 hr tablet Take 2 tablets (1,200 mg total) by mouth 2 (two) times daily. 360 tablet 3   HYDROcodone-acetaminophen (NORCO) 5-325 MG tablet Take 1 tablet by mouth daily as needed for severe pain. 15 tablet 0   icosapent Ethyl (VASCEPA) 1 g capsule TAKE 2 CAPSULES BY MOUTH 2 TIMES DAILY. 360 capsule 3   insulin aspart protamine - aspart (NOVOLOG MIX 70/30 FLEXPEN) (70-30) 100 UNIT/ML FlexPen Inject 65 Units into the skin in the morning and at bedtime.     iron polysaccharides (NIFEREX) 150 MG capsule Take 150 mg by mouth. 3 times per week     levETIRAcetam (KEPPRA) 1000 MG tablet Take 1 tablet (1,000 mg total) by mouth 2 (two) times daily. 180 tablet 1   metFORMIN (GLUCOPHAGE-XR) 500 MG 24 hr tablet Take 500 mg by mouth every evening.   6   metolazone (ZAROXOLYN) 2.5 MG tablet Take 1 tablet (2.5 mg total) by mouth 2 (two) times a week. Every Mon and Fri 30 tablet 3   nitroGLYCERIN (NITROLINGUAL) 0.4 MG/SPRAY spray Place 1 spray under the tongue every 5 (five) minutes x 3 doses as needed for chest pain. 12 g 3   nystatin (MYCOSTATIN/NYSTOP) powder APPLY 1 APPLICATION        TOPICALLY 4 TIMES A DAY AS NEEDED FOR YEAST INFECTIONS 60 g 5   ONE TOUCH ULTRA TEST test strip USE AS DIRECTED FOR TESTING BLOOD GLUCOSE 3 TIMES DAILY  1   polyethylene glycol (MIRALAX / GLYCOLAX) 17 g packet Take 17 g by mouth daily.     potassium chloride SA (KLOR-CON M20) 20 MEQ tablet TAKE 1 TABLET TWICE A DAY 180 tablet 3   promethazine (PHENERGAN) 25 MG tablet Take 1 tablet (25 mg total)  by mouth every 8 (eight) hours as needed for nausea or vomiting. Caution of sedation 30 tablet 1   ranolazine (RANEXA) 1000 MG SR tablet Take 1 tablet (1,000 mg total) by mouth 2 (two) times daily. 180 tablet 3   rosuvastatin (CRESTOR) 20 MG tablet Take 1 tablet (20 mg total) by mouth daily. 90 tablet 3   sodium chloride (OCEAN) 0.65 % SOLN nasal spray Place 1 spray into both nostrils as needed for congestion.     spironolactone (ALDACTONE) 25 MG tablet TAKE 1 TABLET BY MOUTH EVERYDAY AT BEDTIME 90 tablet 3   torsemide (DEMADEX) 20 MG tablet Take 3 tablets (60 mg total) by mouth every morning AND 2 tablets (40 mg total) every evening. 450 tablet 3   Vericiguat 10 MG TABS TAKE 1 TABLET BY MOUTH ONCE DAILY 90 tablet 3   No current facility-administered medications on file prior to visit.     Review of Systems  Constitutional:  Negative for activity change, appetite change, fatigue, fever and unexpected weight change.  HENT:  Negative for congestion, ear pain, rhinorrhea, sinus pressure and sore throat.   Eyes:  Negative for pain, redness and visual disturbance.  Respiratory:  Positive for shortness of breath. Negative for cough and wheezing.   Cardiovascular:  Negative for chest pain and palpitations.  Gastrointestinal:  Negative for abdominal pain, blood in stool, constipation and diarrhea.  Endocrine: Negative for polydipsia and polyuria.  Genitourinary:  Negative for dysuria, frequency and urgency.  Musculoskeletal:  Negative for arthralgias, back pain and myalgias.  Skin:  Positive for rash. Negative for pallor.       Very itchy  Allergic/Immunologic: Negative for environmental allergies.  Neurological:  Negative for dizziness, syncope and headaches.  Hematological:  Negative for adenopathy. Does not bruise/bleed easily.  Psychiatric/Behavioral:  Negative for decreased concentration and dysphoric mood. The patient is not nervous/anxious.       Objective:   Physical  Exam Constitutional:      General: She is not in acute distress.    Appearance: Normal appearance. She is normal weight. She is not ill-appearing or diaphoretic.  HENT:     Head: Normocephalic and atraumatic.     Comments: No facial swelling     Mouth/Throat:     Mouth: Mucous membranes are moist.  Eyes:     General: No scleral icterus.       Right eye: No discharge.        Left eye: No discharge.     Conjunctiva/sclera: Conjunctivae normal.     Pupils: Pupils are equal, round, and reactive to light.  Cardiovascular:     Rate and Rhythm: Normal rate and regular rhythm.  Pulmonary:     Effort: Pulmonary effort is normal. No respiratory distress.     Breath sounds: Normal breath sounds. No stridor. No wheezing.  Musculoskeletal:     Cervical back: Normal range of motion and neck supple.  Lymphadenopathy:     Cervical: No cervical adenopathy.  Skin:    Coloration: Skin is not jaundiced or pale.     Findings: No bruising or erythema.     Comments: Itchy papules -few are excoriated Pink in color  Not vesicles  R cheek Both upper arms/shoulder  In between breasts  R ankle  L inner thigh Upper buttock cleft  L groin  None in axillae, web spaces   No ticks or bites with bullseye appearance  No drainage   Neurological:     Mental Status: She is alert.  Psychiatric:        Mood and Affect: Mood normal.          Assessment & Plan:   Problem List Items Addressed This Visit       Musculoskeletal and Integument   Pruritus    Itchy papules See A/P for papules inst to start zyrtec 10 mg daily         Other   Papules - Primary    Very itchy papules on body at different areas and different stages (few excoriated)  Resemble insect bites (no vesicles or whelps)  No s/s of angioedema  Adv use of zyrtec 10 mg daily  Benadryl prn  Keep cool/avoid harsh detergents or fragrances Avoid insects and sun  Px triamcinolone 0.5% to use tid  If not imp consider prednisone  (does have DM)  Update if not starting to improve in a week or if worsening

## 2021-05-01 NOTE — Patient Instructions (Addendum)
Use generic zyrtec daily 10mg    Use benadryl if needed   Let's try a steroid cream first -triamcinolone up to three times daily  If not helping in a few days let me know and if not helpful we could try prednisone (only if necessary since this will raise blood sugar)   Use a non scented cleanser like cetaphil (since you cannot use dove or ivory)   Keep your fingernails short  Try not to scratch Keep cool - heat makes itch worse  Avoid hot water for bathing  Stay out of the sun   Avoid perfumes  Avoid exposure to mosquitos or other insects

## 2021-05-01 NOTE — Assessment & Plan Note (Signed)
Very itchy papules on body at different areas and different stages (few excoriated)  Resemble insect bites (no vesicles or whelps)  No s/s of angioedema  Adv use of zyrtec 10 mg daily  Benadryl prn  Keep cool/avoid harsh detergents or fragrances Avoid insects and sun  Px triamcinolone 0.5% to use tid  If not imp consider prednisone (does have DM)  Update if not starting to improve in a week or if worsening

## 2021-05-01 NOTE — Assessment & Plan Note (Addendum)
Itchy papules See A/P for papules inst to start zyrtec 10 mg daily

## 2021-05-05 ENCOUNTER — Telehealth: Payer: Self-pay

## 2021-05-05 DIAGNOSIS — R238 Other skin changes: Secondary | ICD-10-CM

## 2021-05-05 DIAGNOSIS — L299 Pruritus, unspecified: Secondary | ICD-10-CM

## 2021-05-05 MED ORDER — PREDNISONE 10 MG PO TABS: ORAL_TABLET | ORAL | 0 refills | Status: DC

## 2021-05-05 NOTE — Telephone Encounter (Signed)
I sent prednisone to her pharmacy  As we discussed prior-be aware it will raise glucose  If not starting to improve in a week please let us know

## 2021-05-05 NOTE — Telephone Encounter (Signed)
Pt notified Rx sent to pharmacy and advised of Dr. Marliss Coots comments

## 2021-05-05 NOTE — Telephone Encounter (Signed)
[facility] Night - Client TELEPHONE ADVICE RECORD [facility] Patient Name: Diane [name] ETT Gender: Female DOB: 05-14-1956 Age: 65 Y 68 M 25 D Return Phone Number: [phone] (Primary) Address: City/ State/ Zip: [place] [address] Client [place] Night - Client Client Site [name], Roque Lias - MD Contact Type Call Who Is Calling Patient / Member / Family / Caregiver Call Type Triage / Clinical Relationship To Patient Self Return Phone Number [phone] (Primary) Chief Complaint Rash - Widespread Reason for Call Symptomatic / Request for Health Information Initial Comment Caller was in the office Monday to see the doctor and she has a rash or something biting her. The doctor ordered a cortisone cream for her and told her to let her know if it didn't work and it isn't working. [facility] [address] [phone] is her pharmacy. Translation No Nurse Assessment Nurse: Laurann Montana, RN, Fransico Meadow Date/Time Eilene Ghazi Time): 05/04/2021 5:27:51 PM Confirm and document reason for call. If symptomatic, describe symptoms. ---Caller states that she has a rash that itches. Caller states Monday MD gave her Cortisone cream. Caller states rash is still itching and spreading. Caller states no other sx. Does the patient have any new or worsening symptoms? ---Yes Will a triage be completed? ---Yes Related visit to physician within the last 2 weeks? ---Yes Does the PT have any chronic conditions? (i.e. diabetes, asthma, this includes High risk factors for pregnancy, etc.) ---Yes List chronic conditions. ---COPD, DM, CHF Is this a behavioral health or substance abuse call? ---No Guidelines Guideline Title Affirmed Question Affirmed Notes Nurse Date/Time (Eastern Time) Rash or Redness - Widespread [1] Purple or bloodcolored rash (spots or dots) AND [2] no fever AND [3] sounds well  to triager Laurann Montana, RN, Fransico Meadow 05/04/2021 5:28:57 PM PLEASE NOTE: All timestamps contained within this report are represented as [place] Standard Time. CONFIDENTIALTY NOTICE: This fax transmission is intended only for the addressee. It contains information that is legally privileged, confidential or otherwise protected from use or disclosure. If you are not the intended recipient, you are strictly prohibited from reviewing, disclosing, copying using or disseminating any of this information or taking any action in reliance on or regarding this information. If you have received this fax in error, please notify us immediately by telephone so that we can arrange for its return to [place]. Phone: [phone], Toll-Free: [phone], Fax: [phone] Page: 2 of 2 Call Id: [id number] [place]. Time Eilene Ghazi Time) Disposition Final User 05/04/2021 5:31:55 PM See HCP within 4 Hours (or PCP triage) Yes Laurann Montana, RN, Elias-Fela Solis Disagree/Comply Disagree Caller Understands Yes PreDisposition Call Doctor Care Advice Given Per Guideline SEE HCP (OR PCP TRIAGE) WITHIN 4 HOURS: * IF OFFICE WILL BE CLOSED AND NO PCP (PRIMARY CARE PROVIDER) SECOND-LEVEL TRIAGE: You need to be seen within the next 3 or 4 hours. A nearby Urgent Care Center [facility]) is often a good source of care. Another choice is to go to the ED. Go sooner if you become worse. CARE ADVICE given per Rash - Widespread and Cause Unknown (Adult) guideline. Comments User: Fransico Meadow, Laurann Montana, RN Date/Time Eilene Ghazi Time): 05/04/2021 5:33:05 PM Caller states that she is going to follow up with PCP tomorrow because PCP told her she would call in Prednisone if needed. Referrals GO TO FACILITY REFUSED

## 2021-05-05 NOTE — Telephone Encounter (Signed)
Left VM requesting pt to call the office back 

## 2021-05-08 DIAGNOSIS — I509 Heart failure, unspecified: Secondary | ICD-10-CM | POA: Diagnosis not present

## 2021-05-08 DIAGNOSIS — E7800 Pure hypercholesterolemia, unspecified: Secondary | ICD-10-CM | POA: Diagnosis not present

## 2021-05-08 DIAGNOSIS — E041 Nontoxic single thyroid nodule: Secondary | ICD-10-CM | POA: Diagnosis not present

## 2021-05-08 DIAGNOSIS — E119 Type 2 diabetes mellitus without complications: Secondary | ICD-10-CM | POA: Diagnosis not present

## 2021-05-08 DIAGNOSIS — G629 Polyneuropathy, unspecified: Secondary | ICD-10-CM | POA: Diagnosis not present

## 2021-05-08 DIAGNOSIS — I10 Essential (primary) hypertension: Secondary | ICD-10-CM | POA: Diagnosis not present

## 2021-05-08 DIAGNOSIS — E1121 Type 2 diabetes mellitus with diabetic nephropathy: Secondary | ICD-10-CM | POA: Diagnosis not present

## 2021-05-12 NOTE — Telephone Encounter (Signed)
I placed the referral so she will get a call  I made it urgent Thanks

## 2021-05-12 NOTE — Telephone Encounter (Signed)
Patient called in stated that the Prednisone cream is not working would like to know the next step.

## 2021-05-12 NOTE — Telephone Encounter (Addendum)
Pt said PCP did give her a cream at 1st the Triamcinolone cream but when that wasn't working she also prescirbed prednisone. Pt said she's still using the cream TID as prescribed and also she is on her last day of prednisone and it's not improving her sxs so she does want to proceed with derm referral. Pt said she's been to [facility] in [place] in the past and would like to go back there if possible because she's in [place]

## 2021-05-12 NOTE — Telephone Encounter (Signed)
It was prednisone oral/not a cream (sure that is what she meant)   In that case I want to refer her to dermatology  Does she prefer [facility] or [facility] (if we have a choice)?

## 2021-05-12 NOTE — Addendum Note (Signed)
Addended by: Loura Pardon A on: 05/12/2021 04:43 PM   Modules accepted: Orders

## 2021-05-16 ENCOUNTER — Telehealth: Payer: Self-pay | Admitting: *Deleted

## 2021-05-16 NOTE — Telephone Encounter (Signed)
Mrs. Selvy called in and related the message and provided the number

## 2021-05-16 NOTE — Telephone Encounter (Signed)
LM for patient to return call  Scheduled for Dermatology appt with Dr Pearline Cables at [facility]  05/14/21 at 8:40am, arrive by 8:30

## 2021-05-17 DIAGNOSIS — L298 Other pruritus: Secondary | ICD-10-CM | POA: Diagnosis not present

## 2021-05-17 DIAGNOSIS — L503 Dermatographic urticaria: Secondary | ICD-10-CM | POA: Diagnosis not present

## 2021-05-19 NOTE — Telephone Encounter (Signed)
Noted, thanks!

## 2021-05-22 NOTE — Progress Notes (Signed)
Remote ICD transmission.   

## 2021-05-31 DIAGNOSIS — E119 Type 2 diabetes mellitus without complications: Secondary | ICD-10-CM | POA: Diagnosis not present

## 2021-05-31 DIAGNOSIS — I5042 Chronic combined systolic (congestive) and diastolic (congestive) heart failure: Secondary | ICD-10-CM | POA: Diagnosis not present

## 2021-05-31 DIAGNOSIS — J449 Chronic obstructive pulmonary disease, unspecified: Secondary | ICD-10-CM | POA: Diagnosis not present

## 2021-06-18 ENCOUNTER — Telehealth: Payer: Self-pay | Admitting: Family Medicine

## 2021-06-18 DIAGNOSIS — E1142 Type 2 diabetes mellitus with diabetic polyneuropathy: Secondary | ICD-10-CM

## 2021-06-18 DIAGNOSIS — E1169 Type 2 diabetes mellitus with other specified complication: Secondary | ICD-10-CM

## 2021-06-18 DIAGNOSIS — E785 Hyperlipidemia, unspecified: Secondary | ICD-10-CM

## 2021-06-18 DIAGNOSIS — D509 Iron deficiency anemia, unspecified: Secondary | ICD-10-CM

## 2021-06-18 DIAGNOSIS — E538 Deficiency of other specified B group vitamins: Secondary | ICD-10-CM

## 2021-06-18 DIAGNOSIS — I10 Essential (primary) hypertension: Secondary | ICD-10-CM

## 2021-06-18 NOTE — Telephone Encounter (Signed)
-----   Message from Ellamae Sia sent at 06/06/2021 11:04 AM EDT ----- Regarding: Lab orders for Monday, 9.19.22 Patient is scheduled for CPX labs, please order future labs, Thanks , Karna Christmas

## 2021-06-19 ENCOUNTER — Other Ambulatory Visit (INDEPENDENT_AMBULATORY_CARE_PROVIDER_SITE_OTHER): Payer: Medicare Other

## 2021-06-19 ENCOUNTER — Telehealth (INDEPENDENT_AMBULATORY_CARE_PROVIDER_SITE_OTHER): Payer: Medicare Other | Admitting: *Deleted

## 2021-06-19 ENCOUNTER — Other Ambulatory Visit: Payer: Self-pay

## 2021-06-19 DIAGNOSIS — E538 Deficiency of other specified B group vitamins: Secondary | ICD-10-CM

## 2021-06-19 DIAGNOSIS — E1169 Type 2 diabetes mellitus with other specified complication: Secondary | ICD-10-CM | POA: Diagnosis not present

## 2021-06-19 DIAGNOSIS — I10 Essential (primary) hypertension: Secondary | ICD-10-CM

## 2021-06-19 DIAGNOSIS — E785 Hyperlipidemia, unspecified: Secondary | ICD-10-CM

## 2021-06-19 DIAGNOSIS — D509 Iron deficiency anemia, unspecified: Secondary | ICD-10-CM | POA: Diagnosis not present

## 2021-06-19 NOTE — Telephone Encounter (Signed)
Pt came in for labs and said she hasn't had a recent iron level checked since having her transfusions. Pt wanted to know if PCP could add that to her labs today. Pt said if not she can discuss it at her CPE next week. Please advise

## 2021-06-19 NOTE — Telephone Encounter (Signed)
Iron added

## 2021-06-19 NOTE — Telephone Encounter (Signed)
I ordered the iron level-unsure if that is too late

## 2021-06-20 ENCOUNTER — Encounter (HOSPITAL_COMMUNITY): Payer: Self-pay

## 2021-06-20 ENCOUNTER — Ambulatory Visit (HOSPITAL_COMMUNITY)
Admission: RE | Admit: 2021-06-20 | Discharge: 2021-06-20 | Disposition: A | Discharge status: Home / Self Care | Payer: Medicare Other | Source: Ambulatory Visit | Attending: Cardiology | Admitting: Cardiology

## 2021-06-20 DIAGNOSIS — I5022 Chronic systolic (congestive) heart failure: Secondary | ICD-10-CM

## 2021-06-20 LAB — CBC WITH DIFFERENTIAL/PLATELET
Basophils Absolute: 0.1 10*3/uL (ref 0.0–0.1)
Basophils Relative: 0.7 % (ref 0.0–3.0)
Eosinophils Absolute: 0 10*3/uL (ref 0.0–0.7)
Eosinophils Relative: 0.4 % (ref 0.0–5.0)
HCT: 42.4 % (ref 36.0–46.0)
Hemoglobin: 14.5 g/dL (ref 12.0–15.0)
Lymphocytes Relative: 15.8 % (ref 12.0–46.0)
Lymphs Abs: 1.3 10*3/uL (ref 0.7–4.0)
MCHC: 34.2 g/dL (ref 30.0–36.0)
MCV: 90.9 fl (ref 78.0–100.0)
Monocytes Absolute: 0.8 10*3/uL (ref 0.1–1.0)
Monocytes Relative: 9.9 % (ref 3.0–12.0)
Neutro Abs: 6 10*3/uL (ref 1.4–7.7)
Neutrophils Relative %: 73.2 % (ref 43.0–77.0)
Platelets: 261 10*3/uL (ref 150.0–400.0)
RBC: 4.66 Mil/uL (ref 3.87–5.11)
RDW: 14.9 % (ref 11.5–15.5)
WBC: 8.2 10*3/uL (ref 4.0–10.5)

## 2021-06-20 LAB — IRON: Iron: 107 ug/dL (ref 42–145)

## 2021-06-20 LAB — LIPID PANEL
Cholesterol: 111 mg/dL (ref 0–200)
HDL: 43.3 mg/dL (ref >39.00)
LDL Cholesterol: 38 mg/dL (ref 0–99)
NonHDL: 68.1
Total CHOL/HDL Ratio: 3
Triglycerides: 152 mg/dL — ABNORMAL HIGH (ref 0.0–149.0)
VLDL: 30.4 mg/dL (ref 0.0–40.0)

## 2021-06-20 LAB — COMPREHENSIVE METABOLIC PANEL
ALT: 11 U/L (ref 0–35)
AST: 14 U/L (ref 0–37)
Albumin: 4.5 g/dL (ref 3.5–5.2)
Alkaline Phosphatase: 52 U/L (ref 39–117)
BUN: 15 mg/dL (ref 6–23)
CO2: 29 mEq/L (ref 19–32)
Calcium: 10 mg/dL (ref 8.4–10.5)
Chloride: 99 mEq/L (ref 96–112)
Creatinine, Ser: 0.8 mg/dL (ref 0.40–1.20)
GFR: 76.32 mL/min (ref >60.00)
Glucose, Bld: 205 mg/dL — ABNORMAL HIGH (ref 70–99)
Potassium: 4.9 mEq/L (ref 3.5–5.1)
Sodium: 137 mEq/L (ref 135–145)
Total Bilirubin: 0.5 mg/dL (ref 0.2–1.2)
Total Protein: 7.3 g/dL (ref 6.0–8.3)

## 2021-06-20 LAB — VITAMIN B12: Vitamin B-12: 246 pg/mL (ref 211–911)

## 2021-06-20 LAB — TSH: TSH: 2.23 u[IU]/mL (ref 0.35–5.50)

## 2021-06-20 NOTE — Progress Notes (Deleted)
No showed

## 2021-06-21 ENCOUNTER — Ambulatory Visit (HOSPITAL_COMMUNITY)
Admission: RE | Admit: 2021-06-21 | Discharge: 2021-06-21 | Disposition: A | Discharge status: Home / Self Care | Payer: Medicare Other | Source: Ambulatory Visit | Attending: Cardiology | Admitting: Cardiology

## 2021-06-21 ENCOUNTER — Encounter (HOSPITAL_COMMUNITY): Payer: Medicare Other

## 2021-06-21 ENCOUNTER — Other Ambulatory Visit: Payer: Self-pay

## 2021-06-21 ENCOUNTER — Encounter (HOSPITAL_COMMUNITY): Payer: Self-pay | Admitting: Cardiology

## 2021-06-21 VITALS — BP 106/50 | HR 80 | Wt 129.0 lb

## 2021-06-21 DIAGNOSIS — Z09 Encounter for follow-up examination after completed treatment for conditions other than malignant neoplasm: Secondary | ICD-10-CM | POA: Insufficient documentation

## 2021-06-21 DIAGNOSIS — F17210 Nicotine dependence, cigarettes, uncomplicated: Secondary | ICD-10-CM | POA: Insufficient documentation

## 2021-06-21 DIAGNOSIS — Z8249 Family history of ischemic heart disease and other diseases of the circulatory system: Secondary | ICD-10-CM | POA: Insufficient documentation

## 2021-06-21 DIAGNOSIS — I255 Ischemic cardiomyopathy: Secondary | ICD-10-CM | POA: Diagnosis not present

## 2021-06-21 DIAGNOSIS — Z7902 Long term (current) use of antithrombotics/antiplatelets: Secondary | ICD-10-CM | POA: Diagnosis not present

## 2021-06-21 DIAGNOSIS — Z7982 Long term (current) use of aspirin: Secondary | ICD-10-CM | POA: Insufficient documentation

## 2021-06-21 DIAGNOSIS — I5022 Chronic systolic (congestive) heart failure: Secondary | ICD-10-CM | POA: Diagnosis not present

## 2021-06-21 DIAGNOSIS — E1151 Type 2 diabetes mellitus with diabetic peripheral angiopathy without gangrene: Secondary | ICD-10-CM | POA: Diagnosis not present

## 2021-06-21 DIAGNOSIS — Z955 Presence of coronary angioplasty implant and graft: Secondary | ICD-10-CM | POA: Diagnosis not present

## 2021-06-21 DIAGNOSIS — R5383 Other fatigue: Secondary | ICD-10-CM | POA: Insufficient documentation

## 2021-06-21 DIAGNOSIS — Z951 Presence of aortocoronary bypass graft: Secondary | ICD-10-CM | POA: Diagnosis not present

## 2021-06-21 DIAGNOSIS — Z79899 Other long term (current) drug therapy: Secondary | ICD-10-CM | POA: Insufficient documentation

## 2021-06-21 DIAGNOSIS — Z8673 Personal history of transient ischemic attack (TIA), and cerebral infarction without residual deficits: Secondary | ICD-10-CM | POA: Diagnosis not present

## 2021-06-21 DIAGNOSIS — I110 Hypertensive heart disease with heart failure: Secondary | ICD-10-CM | POA: Diagnosis not present

## 2021-06-21 DIAGNOSIS — I2510 Atherosclerotic heart disease of native coronary artery without angina pectoris: Secondary | ICD-10-CM | POA: Diagnosis not present

## 2021-06-21 DIAGNOSIS — Z7984 Long term (current) use of oral hypoglycemic drugs: Secondary | ICD-10-CM | POA: Diagnosis not present

## 2021-06-21 DIAGNOSIS — Z794 Long term (current) use of insulin: Secondary | ICD-10-CM | POA: Insufficient documentation

## 2021-06-21 LAB — DIGOXIN LEVEL: Digoxin Level: 0.6 ng/mL — ABNORMAL LOW (ref 0.8–2.0)

## 2021-06-21 NOTE — Patient Instructions (Addendum)
EKG done today.  Labs done today. We will contact you only if your labs are abnormal.  No medication changes were made. Please continue all current medications as prescribed.  PLEASE FOLLOW UP WITH DR. Gwenlyn Found [phone]  Your physician recommends that you schedule a follow-up appointment in: 3 months with our [facility] here in our office.   If you have any questions or concerns before your next appointment please send [place] a message through [place] or call our office at [phone].    TO LEAVE A MESSAGE FOR THE NURSE SELECT OPTION 2, PLEASE LEAVE A MESSAGE INCLUDING: YOUR NAME DATE OF BIRTH CALL BACK NUMBER REASON FOR CALL**this is important as we prioritize the call backs  YOU WILL RECEIVE A CALL BACK THE SAME DAY AS LONG AS YOU CALL BEFORE 4:00 PM   Do the following things EVERYDAY: Weigh yourself in the morning before breakfast. Write it down and keep it in a log. Take your medicines as prescribed Eat low salt foods--Limit salt (sodium) to 2000 mg per day.  Stay as active as you can everyday Limit all fluids for the day to less than 2 liters   At the [facility], you and your health needs are our priority. As part of our continuing mission to provide you with exceptional heart care, we have created designated Provider Care Teams. These Care Teams include your primary Cardiologist (physician) and Advanced Practice Providers (APPs- Physician Assistants and Nurse Practitioners) who all work together to provide you with the care you need, when you need it.   You may see any of the following providers on your designated Care Team at your next follow up: Dr Glori Bickers Dr Haynes Kerns, NP Lyda Jester, [place] Audry Riles, PharmD   Please be sure to bring in all your medications bottles to every appointment.

## 2021-06-22 DIAGNOSIS — L508 Other urticaria: Secondary | ICD-10-CM | POA: Diagnosis not present

## 2021-06-22 NOTE — Progress Notes (Signed)
PCP: [name], MD Cardiology: Dr. Martinique HF Cardiology: Dr. Aundra Dubin  65 y.o. with history of CAD, ischemic cardiomyopathy, CVA, and valvular disease was referred by Dr. Martinique for CHF evaluation.  Patient has an extensive history of vascular disease.  She had CABG in 2007.  Repeat LHC in 2007 showed all grafts closed except LIMA-LAD.  Patient had DES to LCx/OM.  [place] 2016 showed occlusion of DES to LCx/OM with L-L and L-R collaterals. Poor targets for redo CABG.   In 2/21, echo was done showing EF 20-25% with moderate RV dysfunction, moderate-severe MR, moderate TR.  Despite smoking history, PFTs in 2019 looked relatively normal. She additionally had the placement of a carotid stent on the right in 1/19.    RHC was done in 4/21, showing near-normal filling pressures with CI 2.12 by Fick.  CPX in 5/21 showed peak VO2 13, VE/VCO2 slope 37 => moderate HF limitation.  The PFTs on the CPX were not markedly abnormal. She saw Dr. Gwenlyn Found recently, peripheral arterial dopplers in 4/21 showed significant stenosis right SFA.   We worked her up for LVAD.  She was presented at [facility] and saw Dr. Prescott Gum.  He was concerned by her small stature and small LV cavity. Concern that Heartmate 3 would not function well in her small LV, and Heartware LVAD that was thought to be more suitable for small patients has been discontinued.   We considered her for a barostimulation trial, but she does not qualify for either ANTHEM or Batwire due to carotid disease.   She is off dapagliflozin due to recurrrent GU yeast infections.  In 7/21, she had atherectomy of right SFA.  [place] placed in 10/21.   As she was turned down for LVAD at [facility], I had her seen at [facility]. She underwent workup there with echo in 11/21 showing higher EF 35%. RHC, however, showed low CI 1.8.    Echo in 5/22 showed improvement in EF, now up to 45-50% with basal-mid inferolateral and anterolateral akinesis, normal RV.   Patient  returns for followup of CHF.  She is still smoking but trying to cut back.  She still has right calf and thigh cramps after walking about 100 feet (to mailbox), unchanged.  No foot ulcers.  Not short of breath walking on flat ground with her walker, more limited by claudication.  She has generalized fatigue.  No chest pain.      ECG (personally reviewed): NSR, old inferior MI, poor R wave progression.  [employer] device interrogation: Heartlogic score 2                Labs (4/21): K 3.7 => 3.4, creatinine 0.78 => 0.7, hgb 11.3 => 14.4 Labs (5/21): digoxin 0.6, K 3.5, creatinine 0.63 Labs (6/21): K 4.1, creatinine 0.7, LDL 61, TGs 247 Labs (7/21): K 5, creatinine 0.76, hgb 16.9.  Labs (10/21): K 4.6, creatinine 0.86 Labs (11/21): digoxin 0.9, LDL 45, HDL 27, K 4, creatinine 0.86 Labs (4/22): hgb 14.4, K 4.9, creatinine 0.81, digoxin 0.7 Labs (9/22): hgb 14.5, K 4.9, creatinine 0.8, LDL 38  PMH: 1. CAD: CABG 2007.  - LHC in 2007 showed all grafts closed except LIMA-LAD.  Patient had DES to LCx/OM.   - [place] 2016 showed occlusion of DES to LCx/OM with L-L and L-R collaterals.  - Not candidate for redo CABG with poor targets.  2. Chronic systolic CHF: Ischemic cardiomyopathy.   - Echo (2/21): EF 20-25%, moderately dilated and  moderately dysfunctional RV, PASP 49, severe biatrial enlargement, moderate-severe MR, severe TR, mild-moderate AS, IVC dilated.  - RHC (4/21): mean RA 6, PA 41/16, mean 29, mean PCWP 15, CI 2.12, PVR 4.3 WU - CPX (5/21): peak VO2 13, VE/VCO2 slope 37, RER 1.06 => moderate HF limitation.  - Echo (11/21): EF 35%, mild LVH.  - RHC (11/21): baseline CI 1.8, exercise peak VO2 12.2, PCWP up to 24 with exercise.  - Echo (5/22): EF 45-50%, basal-mid inferolateral and anterolateral akinesis, normal RV.  3. Fe deficiency anemia: FOBT negative.  4. Carotid stenosis: Right carotid stent in 1/19.  - Carotid dopplers (6/78): 93-81% LICA stenosis.  - Carotid dopplers (0/17):  51-02% LICA stenosis.  - Carotid dopplers (5/85): 27-78% LICA stenosis.  5. H/o CVA 6. HTN 7. Type 2 diabetes 8. Hyperlipidemia 9. Seizure disorder 10. OSA: Cannot tolerate CPAP.  11. Active smoker: PFTs in 2019 were relatively normal.  She does use oxygen at night.  - PFTs (5/21): FEV1 82%, FVC 80%, ratio 102% => minimal obstruction.  12. PAD: ABIs (4/21) 0.76 on right, 0.86 on left => significant right SFA stenosis.  - Atherectomy/angioplasty right SFA.  - Peripheral arterial dopplers (2/22): 50-74% stenosis right SFA.  13. Chronic penetrating atherosclerotic ulcer descending thoracic aorta.   Social History   Socioeconomic History   Marital status: Divorced    Spouse name: Not on file   Number of children: 0   Years of education: Not on file   Highest education level: Not on file  Occupational History   Occupation: disabled  Tobacco Use   Smoking status: Every Day    Packs/day: 1.00    Years: 51.00    Pack years: 51.00    Types: Cigarettes    Start date: 36    Last attempt to quit: 02/22/2020    Years since quitting: 1.3   Smokeless tobacco: Never  Vaping Use   Vaping Use: Former  Substance and Sexual Activity   Alcohol use: No   Drug use: No   Sexual activity: Not Currently    Partners: Male    Birth control/protection: None  Other Topics Concern   Not on file  Social History Narrative   Right handed   Lives alone in a one story home   Social Determinants of Health   Financial Resource Strain: Not on file  Food Insecurity: No Food Insecurity   Worried About Charity fundraiser in the Last Year: Never true   [place] in the Last Year: Never true  Transportation Needs: Unmet Transportation Needs   Lack of Transportation (Medical): Yes   Lack of Transportation (Non-Medical): No  Physical Activity: Not on file  Stress: Not on file  Social Connections: Not on file  Intimate Partner Violence: Not on file   Family History  Problem Relation Age of  Onset   Heart disease Mother    Diabetes Mother    COPD Mother    Hyperlipidemia Mother    Hypertension Mother    Cancer Father        met, origin unknown   Heart attack Father    Drug abuse Paternal Grandmother    Stroke Paternal Grandfather    Lung cancer Paternal Aunt        lung with mets to brain   Melanoma Paternal Uncle    Lung cancer Paternal Uncle        lung/liver to brain   Cancer Paternal Uncle  cancer of unknown type   Heart disease Maternal Grandfather    Hypertension Sister    Cancer Sister        eyelid   Glaucoma Sister    Heart disease Maternal Aunt        x 2 aunts   ROS: All systems reviewed and negative except as per HPI.   Current Outpatient Medications  Medication Sig Dispense Refill   acetaminophen (TYLENOL) 500 MG tablet Take 1,000 mg by mouth every 6 (six) hours as needed for moderate pain or headache.     albuterol (VENTOLIN HFA) 108 (90 Base) MCG/ACT inhaler INHALE 2 PUFFS EVERY 6 HOURS AS NEEDED FOR WHEEZING/SHORTNESS OF BREATH 18 each 0   aspirin EC 81 MG tablet Take 81 mg by mouth every evening.  30 tablet 6   clopidogrel (PLAVIX) 75 MG tablet Take 1 tablet (75 mg total) by mouth daily. 90 tablet 3   DEXILANT 60 MG capsule TAKE 1 CAPSULE DAILY 90 capsule 0   digoxin (LANOXIN) 0.125 MG tablet Take 0.5 tablets (0.0625 mg total) by mouth daily. 45 tablet 3   diphenhydrAMINE (BENADRYL) 25 mg capsule Take 25 mg by mouth 2 (two) times daily as needed for itching or allergies.      ezetimibe (ZETIA) 10 MG tablet Take 1 tablet (10 mg total) by mouth daily. 90 tablet 3   fluticasone (FLONASE) 50 MCG/ACT nasal spray Place 2 sprays into both nostrils daily. 48 g 1   gabapentin (NEURONTIN) 300 MG capsule TAKE 2 CAPSULES BY MOUTH EVERY DAY AT BEDTIME 180 capsule 1   glucose blood (ONETOUCH ULTRA) test strip 3 (three) times daily.     guaiFENesin (MUCINEX) 600 MG 12 hr tablet Take 2 tablets (1,200 mg total) by mouth 2 (two) times daily. 360 tablet 3    HYDROcodone-acetaminophen (NORCO) 5-325 MG tablet Take 1 tablet by mouth daily as needed for severe pain. 15 tablet 0   icosapent Ethyl (VASCEPA) 1 g capsule TAKE 2 CAPSULES BY MOUTH 2 TIMES DAILY. 360 capsule 3   insulin aspart protamine- aspart (NOVOLOG MIX 70/30) (70-30) 100 UNIT/ML injection Inject 65 Units into the skin daily with breakfast. 60 units at night     iron polysaccharides (NIFEREX) 150 MG capsule Take 150 mg by mouth. 3 times per week     levETIRAcetam (KEPPRA) 1000 MG tablet Take 1 tablet (1,000 mg total) by mouth 2 (two) times daily. 180 tablet 1   metFORMIN (GLUCOPHAGE-XR) 500 MG 24 hr tablet Take 500 mg by mouth every evening.   6   metolazone (ZAROXOLYN) 2.5 MG tablet Take 1 tablet (2.5 mg total) by mouth 2 (two) times a week. Every Mon and Fri 30 tablet 3   nitroGLYCERIN (NITROLINGUAL) 0.4 MG/SPRAY spray Place 1 spray under the tongue every 5 (five) minutes x 3 doses as needed for chest pain. 12 g 3   nystatin (MYCOSTATIN/NYSTOP) powder APPLY 1 APPLICATION        TOPICALLY 4 TIMES A DAY AS NEEDED FOR YEAST INFECTIONS 60 g 5   ONE TOUCH ULTRA TEST test strip USE AS DIRECTED FOR TESTING BLOOD GLUCOSE 3 TIMES DAILY  1   polyethylene glycol (MIRALAX / GLYCOLAX) 17 g packet Take 17 g by mouth daily.     potassium chloride SA (KLOR-CON M20) 20 MEQ tablet TAKE 1 TABLET TWICE A DAY 180 tablet 3   promethazine (PHENERGAN) 25 MG tablet Take 1 tablet (25 mg total) by mouth every 8 (eight) hours as needed for  nausea or vomiting. Caution of sedation 30 tablet 1   ranolazine (RANEXA) 1000 MG SR tablet Take 1 tablet (1,000 mg total) by mouth 2 (two) times daily. 180 tablet 3   rosuvastatin (CRESTOR) 20 MG tablet Take 1 tablet (20 mg total) by mouth daily. 90 tablet 3   sodium chloride (OCEAN) 0.65 % SOLN nasal spray Place 1 spray into both nostrils as needed for congestion.     spironolactone (ALDACTONE) 25 MG tablet TAKE 1 TABLET BY MOUTH EVERYDAY AT BEDTIME 90 tablet 3   torsemide  (DEMADEX) 20 MG tablet Take 3 tablets (60 mg total) by mouth every morning AND 2 tablets (40 mg total) every evening. 450 tablet 3   triamcinolone cream (KENALOG) 0.5 % Apply 1 application topically 3 (three) times daily. To affected areas 30 g 0   Vericiguat 10 MG TABS TAKE 1 TABLET BY MOUTH ONCE DAILY 90 tablet 3   No current facility-administered medications for this encounter.   BP (!) 106/50   Pulse 80   Wt 58.5 kg (129 lb)   LMP 09/02/2003   SpO2 99%   BMI 26.05 kg/m  General: NAD Neck: No JVD, no thyromegaly or thyroid nodule.  Lungs: Clear to auscultation bilaterally with normal respiratory effort. CV: Nondisplaced PMI.  Heart regular S1/S2, no S3/S4, 1/6 SEM RUSB.  No peripheral edema.  No carotid bruit.  Unable to palpate pedal pulses.  Abdomen: Soft, nontender, no hepatosplenomegaly, no distention.  Skin: Intact without lesions or rashes.  Neurologic: Alert and oriented x 3.  Psych: Normal affect. Extremities: No clubbing or cyanosis.  HEENT: Normal.   Assessment/Plan:  1. Chronic systolic CHF: Ischemic cardiomyopathy, [employer] ICD.  Echo in 2/21 showed EF 20-25%, moderately dilated and moderately dysfunctional RV, PASP 49, severe biatrial enlargement, moderate-severe MR, severe TR, mild-moderate AS, IVC dilated. CPX (5/21) with moderate HF limitation, RHC (4/21) with CI low at 2.12.  Medication titration has been severely limited by hypotension and orthostatic symptoms.  Not a transplant candidate due to smoking. She was seen by cardiac surgery at [facility] and not thought to be good LVAD candidate due to small stature, small LV cavity, and redo sternotomy.  I sent her for a 2nd opinion at [facility].  She had echo there in 11/21 with EF higher at 35% but CI low on RHC at 1.8, no LVAD planned based on higher EF.  Echo in 5/22 showed EF yet again increased to 45-50%.  NYHA class II symptoms, more limited by claudication than dyspnea.  She is not volume overloaded on exam or by  Heartlogic.  - Continue digoxin 0.0625 mg daily, check level today.   - Continue torsemide 60 qam/40 qpm.  Continue metolazone 2.5 mg bid, Monday/Friday.  BMET today.   - Continue spironolactone 25 mg daily.  - She has not tolerated BP-active meds well, avoid Coreg and Entresto for now.  She became hypotensive with losartan.  - She did not tolerate SGLT2 inhibitor due to recurrent GU yeast infections.  - Continue vericiguat 10 mg daily.  - She does not qualify for the barostimulation trials due to carotid disease.  - With higher EF, she is not a candidate for LVAD. 2. CAD: s/p CABG 5093 complicated by early graft closure.  Repeat cath in 2007 showed only LIMA-LAD still open and she had DES to LCX/OM.  Cath in 2016 showed this stent was occluded.  She has not had targets for redo CABG and she does not have good interventional  targets. No recent chest pain.  - Continue ASA 81, Plavix 75 mg daily.  - Continue ranolazone 1000 bid.   - Continue Crestor 20 mg daily and Zetia 10 mg daily.  Good lipids in 9/22. 3. Smoking: Still smoking 1 ppd.  She is trying to quit.  - I again recommended nicotine patches.  4. PAD: s/p atherectomy/angioplasty right SFA in 7/21.  Still with significant claudication. Peripheral arterial dopplers in 2/22 with 50-74% right SFA stenosis.  - Follows with Dr. Gwenlyn Found for PV.  5. Carotid stenosis: Moderate stenosis, repeat dopplers 6/23.   6. Fe deficiency anemia: Has been FOBT negative.  Has had Ferahema.  Most recent hgb looks stable.   Followup 3 months with APP   Loralie Champagne 06/22/2021

## 2021-06-23 ENCOUNTER — Ambulatory Visit: Payer: Medicare Other

## 2021-06-26 ENCOUNTER — Ambulatory Visit (INDEPENDENT_AMBULATORY_CARE_PROVIDER_SITE_OTHER): Payer: Medicare Other | Admitting: Family Medicine

## 2021-06-26 ENCOUNTER — Other Ambulatory Visit: Payer: Self-pay

## 2021-06-26 ENCOUNTER — Encounter: Payer: Self-pay | Admitting: Family Medicine

## 2021-06-26 VITALS — BP 126/72 | HR 78 | Temp 97.6°F | Ht <= 58 in | Wt 131.2 lb

## 2021-06-26 DIAGNOSIS — E049 Nontoxic goiter, unspecified: Secondary | ICD-10-CM | POA: Diagnosis not present

## 2021-06-26 DIAGNOSIS — F17200 Nicotine dependence, unspecified, uncomplicated: Secondary | ICD-10-CM | POA: Diagnosis not present

## 2021-06-26 DIAGNOSIS — M8589 Other specified disorders of bone density and structure, multiple sites: Secondary | ICD-10-CM | POA: Diagnosis not present

## 2021-06-26 DIAGNOSIS — E1142 Type 2 diabetes mellitus with diabetic polyneuropathy: Secondary | ICD-10-CM

## 2021-06-26 DIAGNOSIS — E2839 Other primary ovarian failure: Secondary | ICD-10-CM

## 2021-06-26 DIAGNOSIS — I6523 Occlusion and stenosis of bilateral carotid arteries: Secondary | ICD-10-CM

## 2021-06-26 DIAGNOSIS — Z794 Long term (current) use of insulin: Secondary | ICD-10-CM

## 2021-06-26 DIAGNOSIS — D509 Iron deficiency anemia, unspecified: Secondary | ICD-10-CM

## 2021-06-26 DIAGNOSIS — J9611 Chronic respiratory failure with hypoxia: Secondary | ICD-10-CM | POA: Diagnosis not present

## 2021-06-26 DIAGNOSIS — E538 Deficiency of other specified B group vitamins: Secondary | ICD-10-CM

## 2021-06-26 DIAGNOSIS — I10 Essential (primary) hypertension: Secondary | ICD-10-CM

## 2021-06-26 DIAGNOSIS — E1169 Type 2 diabetes mellitus with other specified complication: Secondary | ICD-10-CM

## 2021-06-26 DIAGNOSIS — K219 Gastro-esophageal reflux disease without esophagitis: Secondary | ICD-10-CM | POA: Diagnosis not present

## 2021-06-26 DIAGNOSIS — Z599 Problem related to housing and economic circumstances, unspecified: Secondary | ICD-10-CM

## 2021-06-26 DIAGNOSIS — E785 Hyperlipidemia, unspecified: Secondary | ICD-10-CM

## 2021-06-26 DIAGNOSIS — Z23 Encounter for immunization: Secondary | ICD-10-CM

## 2021-06-26 DIAGNOSIS — R87610 Atypical squamous cells of undetermined significance on cytologic smear of cervix (ASC-US): Secondary | ICD-10-CM

## 2021-06-26 DIAGNOSIS — Z0000 Encounter for general adult medical examination without abnormal findings: Secondary | ICD-10-CM

## 2021-06-26 DIAGNOSIS — I255 Ischemic cardiomyopathy: Secondary | ICD-10-CM | POA: Diagnosis not present

## 2021-06-26 DIAGNOSIS — R87810 Cervical high risk human papillomavirus (HPV) DNA test positive: Secondary | ICD-10-CM

## 2021-06-26 MED ORDER — HYDROCODONE-ACETAMINOPHEN 5-325 MG PO TABS: 1.0000 | ORAL_TABLET | Freq: Every day | ORAL | 0 refills | Status: DC | PRN

## 2021-06-26 MED ORDER — POLYSACCHARIDE IRON COMPLEX 150 MG PO CAPS: ORAL_CAPSULE | ORAL | 3 refills | Status: DC

## 2021-06-26 NOTE — Assessment & Plan Note (Addendum)
Continue care with Dr Chalmers Cater Per pt diet is not perfect and a1c recently up Strongly enc pt to schedule her diabetic eye exam  She wants to do this herself

## 2021-06-26 NOTE — Assessment & Plan Note (Signed)
Under care of cardiology /vascular Taking crestor and zetia and vascepa for cholesterol

## 2021-06-26 NOTE — Progress Notes (Signed)
Subjective:    Patient ID: Diane [name], female    DOB: August 25, 1956, 65 y.o.   MRN: [id number]  This visit occurred during the SARS-CoV-2 public health emergency.  Safety protocols were in place, including screening questions prior to the visit, additional usage of staff PPE, and extensive cleaning of exam room while observing appropriate contact time as indicated for disinfecting solutions.   HPI Pt presents for amw and annual review of chronic medical problems   I have personally reviewed the Medicare Annual Wellness questionnaire and have noted 1. The patient's medical and social history 2. Their use of alcohol, tobacco or illicit drugs 3. Their current medications and supplements 4. The patient's functional ability including ADL's, fall risks, home safety risks and hearing or visual             impairment. 5. Diet and physical activities 6. Evidence for depression or mood disorders  The patients weight, height, BMI have been recorded in the chart and visual acuity is per eye clinic.  I have made referrals, counseling and provided education to the patient based review of the above and I have provided the pt with a written personalized care plan for preventive services. Reviewed and updated provider list, see scanned forms.  See scanned forms.  Routine anticipatory guidance given to patient.  See health maintenance. Colon cancer screening  colonoscopy 10/18   Breast cancer screening   mammogram 1/22  Self breast exam- no lumps  Flu vaccine- today  Covid immunized-set up booster Tetanus vaccine postponed for ins  Pneumovax-up to date  Zoster vaccine-interested in that if covered  Pap -ascus 9/21 with HPV- was referred to gyn and colposcopy done She has follow up upcoming (yearly)  Dexa 10/19  osteopenia   Falls -one fall /lost balance , uses a walker now /fell on porch , hit her head but no injury other than a bump (was able to get herself up)  [employer]- not  taking vit D (cannot afford it )-will check with pharmacist  Exercise - a little walking with her walker    Advance directive= has a poa but not a living will ? Unsure  Given materials to work on  Sister is [employer] function addressed- see scanned forms- and if abnormal then additional documentation follows.  Pretty good!   The games on her phone help her  Good at remembering things like birthdays but occ forgets names    PMH and [place] reviewed  Meds, vitals, and allergies reviewed.   ROS: See HPI.  Otherwise negative.    Weight : Wt Readings from Last 3 Encounters:  06/26/21 131 lb 4 oz (59.5 kg)  06/21/21 129 lb (58.5 kg)  05/01/21 125 lb 5 oz (56.8 kg)   27.91 kg/m Stays tired and off balance all the time- but that is baseline   Hearing/vision: Hearing Screening   '500Hz'  '1000Hz'  '2000Hz'  '4000Hz'   Right ear 0 40 40 40  Left ear 40 40 40 40     PHQ: Mood has been good  Loves the fall  Depression screen [facility] 2/9 06/26/2021 09/13/2020 06/07/2020 04/20/2019  Decreased Interest 0 0 0 0  Down, Depressed, Hopeless 0 0 0 0  PHQ - 2 Score 0 0 0 0  Altered sleeping - 1 - 0  Tired, decreased energy - 1 - 3  Change in appetite - 0 - 0  Feeling bad or failure about yourself  - 0 - 0  Trouble concentrating -  0 - 0  Moving slowly or fidgety/restless - 0 - 0  Suicidal thoughts - 0 - 0  PHQ-9 Score - 2 - 3  Difficult doing work/chores - - - Not difficult at all  Some recent data might be hidden     ADLs Some problems with bathing or cleaning /anything she does with arms   Functionality:-fair   Care team  Elisah Parmer-pcp Berry-cardiology/vasc McClean-cardiol Balan-endocrinology Devishwar-rheum Miller-opt    Smoking status with smoker's cough No changes  Still 1ppd  Always thinking about quitting but not ready yet  Perhaps December     HTN bp is stable today  No cp or palpitations or headaches or edema  No side effects to medicines  BP Readings from Last 3  Encounters:  06/26/21 126/72  06/21/21 (!) 106/50  05/01/21 108/64      Under care of cardiology for this and ischemic cardiomyopathy with CHF Also vasc dz/carotid dz   DM2 Under care of Dr Chalmers Cater A1c was up a little 8-9 and went up on her insulin   Eye exam- due for  Foot exam  Hyperlipidemia  Lab Results  Component Value Date   CHOL 111 06/19/2021   CHOL 103 08/19/2020   CHOL 140 05/31/2020   Lab Results  Component Value Date   HDL 43.30 06/19/2021   HDL 27 (L) 08/19/2020   HDL 38.00 (L) 05/31/2020   Lab Results  Component Value Date   LDLCALC 38 06/19/2021   LDLCALC 45 08/19/2020   LDLCALC 67 05/31/2020   Lab Results  Component Value Date   TRIG 152.0 (H) 06/19/2021   TRIG 155 (H) 08/19/2020   TRIG 174.0 (H) 05/31/2020   Lab Results  Component Value Date   CHOLHDL 3 06/19/2021   CHOLHDL 3.8 08/19/2020   CHOLHDL 4 05/31/2020   Lab Results  Component Value Date   LDLDIRECT 76.0 04/11/2017   LDLDIRECT 98.0 06/09/2013   LDLDIRECT 53.6 12/12/2012   Takes crestor 20 mg daily with zetia 10 mg daily  Also vascepa 1g 2 caps bid   H/o goiter watched by endocrinology Lab Results  Component Value Date   TSH 2.23 06/19/2021   History of iron def anemia Lab Results  Component Value Date   WBC 8.2 06/19/2021   HGB 14.5 06/19/2021   HCT 42.4 06/19/2021   MCV 90.9 06/19/2021   PLT 261.0 06/19/2021   Has seen hematology  Lab Results  Component Value Date   IRON 107 06/19/2021   TIBC 609 (H) 01/09/2021   FERRITIN 13 01/09/2021   Had iron infusion in the spring   Vit B12 def Lab Results  Component Value Date   VITAMINB12 246 06/19/2021  Oral supplementation -sublingual  Niderex 150 mg three times weekly Takes dexliant for acid reflux    Patient Active Problem List   Diagnosis Date Noted   Papules 05/01/2021   Pruritus 05/01/2021   ASCUS with positive high risk HPV cervical 06/14/2020   Chronic pain of right lower extremity 05/16/2020   Right  knee pain 05/16/2020   Right groin pain 05/16/2020   Atherosclerotic vascular disease 03/03/2020   Healthcare maintenance 12/25/2019   Shortness of breath 12/25/2019   Dysphagia 12/09/2019   Iron deficiency anemia 11/05/2019   Coronary artery disease of bypass graft of native heart with stable angina pectoris ([place]) 11/02/2019   Visit for routine gyn exam 04/23/2019   Generalized weakness 03/24/2019   Occult blood in stools    Positive colorectal cancer screening using  DNA-based stool test 12/22/2018   Anemia 12/22/2018   Fatigue 12/05/2018   Estrogen deficiency 04/21/2018   Screening mammogram, encounter for 04/21/2018   Osteopenia 04/21/2018   History of HPV infection 04/21/2018   Dizzy spells 03/03/2018   Ischemic cardiomyopathy 01/21/2018   Other constipation 05/07/2017   Colon cancer screening 04/19/2017   Incisional hernia 01/14/2017   Smokers' cough ([name]) 08/13/2016   Chronic respiratory failure ([place]) 07/11/2016   Sleep-related hypoventilation due to lower airway obstruction 05/20/2016   Periodic limb movements of sleep 17/71/1657   Chronic systolic congestive heart failure ([place]) 05/08/2016   Type 2 diabetes mellitus with diabetic polyneuropathy, with long-term current use of insulin ([place]) 12/13/2015   B12 deficiency 12/01/2015   History of CVA (cerebrovascular accident) 11/29/2015   Abnormal nuclear stress test 05/06/2015   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 of localized onset, not intractable, without status epilepticus ([place]) 03/22/2015   Cervical disc disorder with radiculopathy of cervical region 03/22/2015   Complex partial seizures ([place]) 09/16/2013   Encounter for Medicare annual wellness exam 06/16/2013   Antiplatelet or antithrombotic long-term use 06/08/2013   Seizures ([place]) 03/25/2013   Goiter 09/01/2012   Other screening mammogram 06/13/2012   Allergic rhinitis 06/13/2012   Carotid artery disease ([place]) 08/20/2011    Obstructive sleep apnea 09/18/2007   TOBACCO USE 08/08/2007   Hyperlipidemia associated with type 2 diabetes mellitus ([place]) 03/26/2007   ANXIETY 03/26/2007   DEPRESSION 03/26/2007   Migraine headache 03/26/2007   CARPAL TUNNEL SYNDROME, BILATERAL 03/26/2007   Essential hypertension 03/26/2007   Hx of CABG 03/26/2007   GERD 03/26/2007   Claudication in peripheral vascular disease ([place]) 03/26/2007   Past Medical History:  Diagnosis Date   Anemia    Carotid stenosis    a. s/p right carotid stent 10/2017.   Chronic systolic CHF (congestive heart failure) (HCC)    Colon polyps    COPD (chronic obstructive pulmonary disease) (HCC)    Coronary artery disease    post CABG in 3/07 , coronary stents    Diabetes mellitus    type 2   Dyslipidemia    Fibromyalgia    GERD (gastroesophageal reflux disease)    Headache    hx of    Hyperlipidemia    Hypertension    Myocardial infarction ([place])    Pneumonia    hx of    Seizures ([place])    last seizure- 03/2013    Shortness of breath dyspnea    with exertion or when fluid builds up    Sleep apnea    used to wear a cpap- not used in 3 years    Status post dilation of esophageal narrowing    Stroke [facility]) 1993   problems with balance    Systolic murmur    known mild AS and MR   Thyroid goiter    Tobacco abuse    Past Surgical History:  Procedure Laterality Date   ABDOMINAL AORTOGRAM W/LOWER EXTREMITY N/A 04/18/2020   Procedure: ABDOMINAL AORTOGRAM W/LOWER EXTREMITY;  Surgeon: Lorretta Harp, MD;  Location: [place] [facility];  Service: Cardiovascular;  Laterality: N/A;   APPENDECTOMY     BIOPSY  02/03/2019   Procedure: BIOPSY;  Surgeon: Ladene Artist, MD;  Location: [facility];  Service: Endoscopy;;   BREAST BIOPSY Left 2016   CARDIAC CATHETERIZATION  11/29/05   EF of 55%   CARDIAC CATHETERIZATION  08/06/06   EF of 45-50%   CARDIAC CATHETERIZATION N/A 05/06/2015  Procedure: Right/Left Heart Cath and Coronary/Graft Angiography;   Surgeon: Peter M Martinique, MD;  Location: [place] [facility];  Service: Cardiovascular;  Laterality: N/A;   CAROTID PTA/STENT INTERVENTION N/A 10/10/2017   Procedure: CAROTID PTA/STENT INTERVENTION - Right;  Surgeon: Serafina Mitchell, MD;  Location: [place] [facility];  Service: Cardiovascular;  Laterality: N/A;   CERVICAL FUSION  1990   CHOLECYSTECTOMY     COLON RESECTION     mass removed and 4 in of colon   COLONOSCOPY WITH PROPOFOL N/A 07/16/2017   Procedure: COLONOSCOPY WITH PROPOFOL;  Surgeon: Ladene Artist, MD;  [facility]: [facility];  Service: Endoscopy;  Laterality: N/A;   CORONARY ARTERY BYPASS GRAFT  12/04/2005   x5 -- left internal mammary artery to the LAD, left radial artery to the ramus intermedius, saphenous vein graft to the obtuse marginal 1, sequential saphenous vein grat to the acute marginal and posterior descending, endoscopic vein harvesting from the left thigh with open vein harvest from right leg   CORONARY STENT PLACEMENT  08/11/06   PCI of her ciurcumflex/OM vessel   ESOPHAGOGASTRODUODENOSCOPY (EGD) WITH PROPOFOL N/A 02/03/2019   Procedure: ESOPHAGOGASTRODUODENOSCOPY (EGD) WITH PROPOFOL;  Surgeon: Ladene Artist, MD;  [facility];  Service: Endoscopy;  Laterality: N/A;   ICD IMPLANT N/A 07/15/2020   Procedure: ICD IMPLANT;  Surgeon: Vickie Epley, MD;  Location: [place] [facility];  Service: Cardiovascular;  Laterality: N/A;   LAPAROTOMY Bilateral 05/19/2015   Procedure: EXPLORATORY LAPAROTOMY WITH BILATERAL SALPINGO OOPHORECTOMY /OMENTECTOMY/SEGMENTAL SIGMOID COLECTOMY ;  Surgeon: Everitt Amber, MD;  Location: [facility];  Service: Gynecology;  Laterality: Bilateral;   PERIPHERAL VASCULAR BALLOON ANGIOPLASTY  04/18/2020   Procedure: PERIPHERAL VASCULAR BALLOON ANGIOPLASTY;  Surgeon: Lorretta Harp, MD;  Location: [place] [facility];  Service: Cardiovascular;;  rt SFA  atherectomy and DCB   PERIPHERAL VASCULAR CATHETERIZATION N/A 11/15/2015   Procedure:  Carotid PTA/Stent Intervention;  Surgeon: Lorretta Harp, MD;  Location: [place] [facility];  Service: Cardiovascular;  Laterality: N/A;   PERIPHERAL VASCULAR CATHETERIZATION  11/15/2015   Procedure: Carotid Angiography;  Surgeon: Lorretta Harp, MD;  Location: [place] [facility];  Service: Cardiovascular;;   RIGHT HEART CATH N/A 01/27/2020   Procedure: RIGHT HEART CATH;  Surgeon: Larey Dresser, MD;  Location: [place] [facility];  Service: Cardiovascular;  Laterality: N/A;   Social History   Tobacco Use   Smoking status: Every Day    Packs/day: 1.00    Years: 51.00    Pack years: 51.00    Types: Cigarettes    Start date: 30    Last attempt to quit: 02/22/2020    Years since quitting: 1.3   Smokeless tobacco: Never  Vaping Use   Vaping Use: Former  Substance Use Topics   Alcohol use: No   Drug use: No   Family History  Problem Relation Age of Onset   Heart disease Mother    Diabetes Mother    COPD Mother    Hyperlipidemia Mother    Hypertension Mother    Cancer Father        met, origin unknown   Heart attack Father    Drug abuse Paternal Grandmother    Stroke Paternal Grandfather    Lung cancer Paternal Aunt        lung with mets to brain   Melanoma Paternal Uncle    Lung cancer Paternal Uncle        lung/liver to brain  Cancer Paternal Uncle        cancer of unknown type   Heart disease Maternal Grandfather    Hypertension Sister    Cancer Sister        eyelid   Glaucoma Sister    Heart disease Maternal Aunt        x 2 aunts   Allergies  Allergen Reactions   Bee Venom Itching and Swelling   Benadryl [Diphenhydramine] Other (See Comments)    Insomnia    Entresto [Sacubitril-Valsartan] Cough   Farxiga [Dapagliflozin]     Yeast infection   Tetracycline     Unknown, pt cannot recall exact reaction   Canagliflozin Other (See Comments)   Other Other (See Comments)   Sitagliptin Other (See Comments)   Ace Inhibitors Cough   Current Outpatient  Medications on File Prior to Visit  Medication Sig Dispense Refill   acetaminophen (TYLENOL) 500 MG tablet Take 1,000 mg by mouth every 6 (six) hours as needed for moderate pain or headache.     albuterol (VENTOLIN HFA) 108 (90 Base) MCG/ACT inhaler INHALE 2 PUFFS EVERY 6 HOURS AS NEEDED FOR WHEEZING/SHORTNESS OF BREATH 18 each 0   aspirin EC 81 MG tablet Take 81 mg by mouth every evening.  30 tablet 6   clopidogrel (PLAVIX) 75 MG tablet Take 1 tablet (75 mg total) by mouth daily. 90 tablet 3   DEXILANT 60 MG capsule TAKE 1 CAPSULE DAILY 90 capsule 0   digoxin (LANOXIN) 0.125 MG tablet Take 0.5 tablets (0.0625 mg total) by mouth daily. 45 tablet 3   diphenhydrAMINE (BENADRYL) 25 mg capsule Take 25 mg by mouth 2 (two) times daily as needed for itching or allergies.      ezetimibe (ZETIA) 10 MG tablet Take 1 tablet (10 mg total) by mouth daily. 90 tablet 3   fluticasone (FLONASE) 50 MCG/ACT nasal spray Place 2 sprays into both nostrils daily. 48 g 1   gabapentin (NEURONTIN) 300 MG capsule TAKE 2 CAPSULES BY MOUTH EVERY DAY AT BEDTIME 180 capsule 1   glucose blood (ONETOUCH ULTRA) test strip 3 (three) times daily.     guaiFENesin (MUCINEX) 600 MG 12 hr tablet Take 2 tablets (1,200 mg total) by mouth 2 (two) times daily. 360 tablet 3   icosapent Ethyl (VASCEPA) 1 g capsule TAKE 2 CAPSULES BY MOUTH 2 TIMES DAILY. 360 capsule 3   insulin aspart protamine- aspart (NOVOLOG MIX 70/30) (70-30) 100 UNIT/ML injection Inject 65 Units into the skin daily with breakfast. 60 units at night     levETIRAcetam (KEPPRA) 1000 MG tablet Take 1 tablet (1,000 mg total) by mouth 2 (two) times daily. 180 tablet 1   metFORMIN (GLUCOPHAGE-XR) 500 MG 24 hr tablet Take 500 mg by mouth every evening.   6   metolazone (ZAROXOLYN) 2.5 MG tablet Take 1 tablet (2.5 mg total) by mouth 2 (two) times a week. Every Mon and Fri 30 tablet 3   nitroGLYCERIN (NITROLINGUAL) 0.4 MG/SPRAY spray Place 1 spray under the tongue every 5 (five)  minutes x 3 doses as needed for chest pain. 12 g 3   nystatin (MYCOSTATIN/NYSTOP) powder APPLY 1 APPLICATION        TOPICALLY 4 TIMES A DAY AS NEEDED FOR YEAST INFECTIONS 60 g 5   ONE TOUCH ULTRA TEST test strip USE AS DIRECTED FOR TESTING BLOOD GLUCOSE 3 TIMES DAILY  1   polyethylene glycol (MIRALAX / GLYCOLAX) 17 g packet Take 17 g by mouth daily.  potassium chloride SA (KLOR-CON M20) 20 MEQ tablet TAKE 1 TABLET TWICE A DAY 180 tablet 3   promethazine (PHENERGAN) 25 MG tablet Take 1 tablet (25 mg total) by mouth every 8 (eight) hours as needed for nausea or vomiting. Caution of sedation 30 tablet 1   ranolazine (RANEXA) 1000 MG SR tablet Take 1 tablet (1,000 mg total) by mouth 2 (two) times daily. 180 tablet 3   rosuvastatin (CRESTOR) 20 MG tablet Take 1 tablet (20 mg total) by mouth daily. 90 tablet 3   sodium chloride (OCEAN) 0.65 % SOLN nasal spray Place 1 spray into both nostrils as needed for congestion.     spironolactone (ALDACTONE) 25 MG tablet TAKE 1 TABLET BY MOUTH EVERYDAY AT BEDTIME 90 tablet 3   torsemide (DEMADEX) 20 MG tablet Take 3 tablets (60 mg total) by mouth every morning AND 2 tablets (40 mg total) every evening. 450 tablet 3   triamcinolone cream (KENALOG) 0.5 % Apply 1 application topically 3 (three) times daily. To affected areas 30 g 0   Vericiguat 10 MG TABS TAKE 1 TABLET BY MOUTH ONCE DAILY 90 tablet 3   No current facility-administered medications on file prior to visit.    Review of Systems  Constitutional:  Negative for activity change, appetite change, fatigue, fever and unexpected weight change.  HENT:  Negative for congestion, ear pain, rhinorrhea, sinus pressure and sore throat.   Eyes:  Negative for pain, redness and visual disturbance.  Respiratory:  Positive for shortness of breath. Negative for cough and wheezing.   Cardiovascular:  Negative for chest pain, palpitations and leg swelling.  Gastrointestinal:  Negative for abdominal pain, blood in stool,  constipation and diarrhea.  Endocrine: Negative for polydipsia and polyuria.  Genitourinary:  Negative for dysuria, frequency and urgency.  Musculoskeletal:  Negative for arthralgias, back pain and myalgias.  Skin:  Negative for pallor and rash.  Allergic/Immunologic: Negative for environmental allergies.  Neurological:  Negative for dizziness, syncope and headaches.  Hematological:  Negative for adenopathy. Does not bruise/bleed easily.  Psychiatric/Behavioral:  Negative for decreased concentration and dysphoric mood. The patient is not nervous/anxious.       Objective:   Physical Exam Constitutional:      General: She is not in acute distress.    Appearance: Normal appearance. She is well-developed and normal weight. She is not ill-appearing or diaphoretic.  HENT:     Head: Normocephalic and atraumatic.     Right Ear: Tympanic membrane, ear canal and external ear normal.     Left Ear: Tympanic membrane, ear canal and external ear normal.     Nose: Nose normal. No congestion.     Mouth/Throat:     Mouth: Mucous membranes are moist.     Pharynx: Oropharynx is clear. No posterior oropharyngeal erythema.  Eyes:     General: No scleral icterus.    Extraocular Movements: Extraocular movements intact.     Conjunctiva/sclera: Conjunctivae normal.     Pupils: Pupils are equal, round, and reactive to light.  Neck:     Thyroid: No thyromegaly.     Vascular: No carotid bruit or JVD.  Cardiovascular:     Rate and Rhythm: Normal rate and regular rhythm.     Pulses: Normal pulses.     Heart sounds: Normal heart sounds.    No gallop.  Pulmonary:     Effort: Pulmonary effort is normal. No respiratory distress.     Breath sounds: Normal breath sounds. No wheezing.  Comments: Good air exch Chest:     Chest wall: No tenderness.  Abdominal:     General: Bowel sounds are normal. There is no distension or abdominal bruit.     Palpations: Abdomen is soft. There is no mass.     Tenderness:  There is no abdominal tenderness.     Hernia: No hernia is present.  Genitourinary:    Comments: Breast exam: No mass, nodules, thickening, tenderness, bulging, retraction, inflamation, nipple discharge or skin changes noted.  No axillary or clavicular LA.     Musculoskeletal:        General: No tenderness. Normal range of motion.     Cervical back: Normal range of motion and neck supple. No rigidity. No muscular tenderness.     Right lower leg: No edema.     Left lower leg: No edema.     Comments: No kyphosis   Lymphadenopathy:     Cervical: No cervical adenopathy.  Skin:    General: Skin is warm and dry.     Coloration: Skin is not pale.     Findings: No erythema or rash.     Comments: Some superficial bruises of different ages on arms Thin skin  Solar lentigines diffusely   Neurological:     Mental Status: She is alert. Mental status is at baseline.     Cranial Nerves: No cranial nerve deficit.     Motor: No abnormal muscle tone.     Coordination: Coordination normal.     Gait: Gait normal.     Deep Tendon Reflexes: Reflexes are normal and symmetric. Reflexes normal.  Psychiatric:        Mood and Affect: Mood normal.        Cognition and Memory: Cognition and memory normal.     Comments: Pleasant Mentally sharp          Assessment & Plan:    Problem List Items Addressed This Visit       Cardiovascular and Mediastinum   Carotid artery disease ([place]) (Chronic)    Under care of cardiology /vascular Taking crestor and zetia and vascepa for cholesterol       Essential hypertension    Well covered in the setting of ischemic cardiomyopathy bp in fair control at this time  BP Readings from Last 1 Encounters:  06/26/21 126/72   No changes needed Most recent labs reviewed  Disc lifstyle change with low sodium diet and exercise  Plan to continue medications for CHF with Dr Marigene Ehlers Spironolactone 25 mg daily  demadex 20 mg daily Digoxin 0.0625 mg daily  ranexa  1000 mg xr bid        Respiratory   Chronic respiratory failure (HCC)     Digestive   GERD    Continues dexilant 60 mg daily  Enc diet change for GERD Watch B12 and D levels and iron         Endocrine   Hyperlipidemia associated with type 2 diabetes mellitus ([place])    Disc goals for lipids and reasons to control them Rev last labs with pt Rev low sat fat diet in detail  Some improvement in LDL, HDL and trig  Plan to continue  crestor 20 mg daily  zetia 10 mg daily  vascepa 2 g bid  Under care of cardiology      Goiter    No clinical changes  Lab Results  Component Value Date   TSH 2.23 06/19/2021  Type 2 diabetes mellitus with diabetic polyneuropathy, with long-term current use of insulin (HCC)    Continue care with Dr Chalmers Cater Per pt diet is not perfect and a1c recently up Strongly enc pt to schedule her diabetic eye exam  She wants to do this herself        Musculoskeletal and Integument   Osteopenia    dexa 10/19 reviewed No fractures One fall /discussed fall prevention, will continue using walker Not taking vitamin D due to inability to afford it, will reach out to our pharmacist  Enc safe exercise with walker  dexa ordered         Other   TOBACCO USE    Disc in detail risks of smoking and possible outcomes including copd, vascular/ heart disease, cancer , respiratory and sinus infections  Pt voices understanding Has not been able to quit so far  Considering use of patches , would like to consider quitting in december      Encounter for Medicare annual wellness exam - Primary    Reviewed health habits including diet and exercise and skin cancer prevention Reviewed appropriate screening tests for age  Also reviewed health mt list, fam hx and immunization status , as well as social and family history   See HPI Labs reviewed  Colonoscopy utd Mammogram utd  covid immunized Interested in shingrix if covered and plans to check  dexa ordered ,  will reach out to pharmacist re: inability to afford vit D Advance directive is partially done, given materials to work on this  No cognitive concerns Hearing screen reviewed /no c/o  PHQ score of 0 Discussed reasons for smoking cessation       B12 deficiency   Estrogen deficiency   Relevant Orders   DG Bone Density   Iron deficiency anemia    Multifactorial Improved with iron infusion  Continues hematology care  Taking niferex 150 mg three times weekly  Cbc is improved  Lab Results  Component Value Date   IRON 107 06/19/2021   TIBC 609 (H) 01/09/2021   FERRITIN 13 01/09/2021         Relevant Medications   iron polysaccharides (NIFEREX) 150 MG capsule   ASCUS with positive high risk HPV cervical    Pt continues gyn f/u (thinks she is due for appt) S/p colposcopy No complaints       Other Visit Diagnoses     Need for influenza vaccination       Relevant Orders   Flu Vaccine QUAD High Dose(Fluad) (Completed)

## 2021-06-26 NOTE — Assessment & Plan Note (Signed)
Continues dexilant 60 mg daily  Enc diet change for GERD Watch B12 and D levels and iron

## 2021-06-26 NOTE — Addendum Note (Signed)
Addended by: Loura Pardon A on: 06/26/2021 07:15 PM   Modules accepted: Orders

## 2021-06-26 NOTE — Assessment & Plan Note (Signed)
Well covered in the setting of ischemic cardiomyopathy bp in fair control at this time  BP Readings from Last 1 Encounters:  06/26/21 126/72   No changes needed Most recent labs reviewed  Disc lifstyle change with low sodium diet and exercise  Plan to continue medications for CHF with Dr Marigene Ehlers Spironolactone 25 mg daily  demadex 20 mg daily Digoxin 0.0625 mg daily  ranexa 1000 mg xr bid

## 2021-06-26 NOTE — Assessment & Plan Note (Signed)
Pt continues gyn f/u (thinks she is due for appt) S/p colposcopy No complaints

## 2021-06-26 NOTE — Assessment & Plan Note (Signed)
No clinical changes  Lab Results  Component Value Date   TSH 2.23 06/19/2021

## 2021-06-26 NOTE — Patient Instructions (Addendum)
If you are interested in the new shingles vaccine (Shingrix) - call your local pharmacy to check on coverage and availability  If affordable, get on a wait list at your pharmacy to get the vaccine.  Let us know when you are ready and I will send in a px for the shingrix vaccine  (wait a month after your covid shot)   Make sure you schedule your annual eye exam   Flu shot today   Schedule your bone density test when you can   I will check in with our pharmacist to ask about the vitamin D

## 2021-06-26 NOTE — Assessment & Plan Note (Signed)
Disc in detail risks of smoking and possible outcomes including copd, vascular/ heart disease, cancer , respiratory and sinus infections  Pt voices understanding Has not been able to quit so far  Considering use of patches , would like to consider quitting in december

## 2021-06-26 NOTE — Assessment & Plan Note (Signed)
dexa 10/19 reviewed No fractures One fall /discussed fall prevention, will continue using walker Not taking vitamin D due to inability to afford it, will reach out to our pharmacist  Enc safe exercise with walker  dexa ordered

## 2021-06-26 NOTE — Assessment & Plan Note (Signed)
Disc goals for lipids and reasons to control them Rev last labs with pt Rev low sat fat diet in detail  Some improvement in LDL, HDL and trig  Plan to continue  crestor 20 mg daily  zetia 10 mg daily  vascepa 2 g bid  Under care of cardiology

## 2021-06-26 NOTE — Assessment & Plan Note (Signed)
Reviewed health habits including diet and exercise and skin cancer prevention Reviewed appropriate screening tests for age  Also reviewed health mt list, fam hx and immunization status , as well as social and family history   See HPI Labs reviewed  Colonoscopy utd Mammogram utd  covid immunized Interested in shingrix if covered and plans to check  dexa ordered , will reach out to pharmacist re: inability to afford vit D Advance directive is partially done, given materials to work on this  No cognitive concerns Hearing screen reviewed /no c/o  PHQ score of 0 Discussed reasons for smoking cessation

## 2021-06-26 NOTE — Assessment & Plan Note (Signed)
Multifactorial Improved with iron infusion  Continues hematology care  Taking niferex 150 mg three times weekly  Cbc is improved  Lab Results  Component Value Date   IRON 107 06/19/2021   TIBC 609 (H) 01/09/2021   FERRITIN 13 01/09/2021

## 2021-06-27 DIAGNOSIS — Z23 Encounter for immunization: Secondary | ICD-10-CM | POA: Diagnosis not present

## 2021-06-29 ENCOUNTER — Other Ambulatory Visit: Payer: Self-pay | Admitting: Family Medicine

## 2021-06-30 DIAGNOSIS — I5042 Chronic combined systolic (congestive) and diastolic (congestive) heart failure: Secondary | ICD-10-CM | POA: Diagnosis not present

## 2021-06-30 DIAGNOSIS — J449 Chronic obstructive pulmonary disease, unspecified: Secondary | ICD-10-CM | POA: Diagnosis not present

## 2021-06-30 DIAGNOSIS — E119 Type 2 diabetes mellitus without complications: Secondary | ICD-10-CM | POA: Diagnosis not present

## 2021-07-05 ENCOUNTER — Telehealth: Payer: Self-pay

## 2021-07-05 NOTE — Telephone Encounter (Signed)
Consulted by PCP for medication cost review: Vitamin D3 (pt unable to afford)  Over the counter [facility] brand vitamin D3 softgels 1000 IU (25 mcg) are available at [facility]. A bottle of 100 is about $9 and currently on sale - buy one get one free. Please let patient know to look for the store brand. She can likely find smaller bottles of about 30 count for $1-3 at [facility] or [facility]. She can also check with her insurance to see if they offer an allowance for OTC products. Usually you have to order these directly from the insurance company.   Debbora Dus, PharmD Clinical Pharmacist [facility] [phone]

## 2021-07-06 NOTE — Progress Notes (Signed)
07/07/2021 - Called patient. Informed her of Vit D prices that [place] stated. Patient understood and is going to look at [facility] and [facility].   Called patient; left a message for her to return my call.  Debbora Dus, CPP notified  Marijean Niemann, [place] Clinical Pharmacy Assistant [phone]

## 2021-07-17 ENCOUNTER — Other Ambulatory Visit (HOSPITAL_BASED_OUTPATIENT_CLINIC_OR_DEPARTMENT_OTHER): Payer: Self-pay | Admitting: Cardiovascular Disease

## 2021-07-17 DIAGNOSIS — I739 Peripheral vascular disease, unspecified: Secondary | ICD-10-CM

## 2021-07-17 IMAGING — CR DG CHEST 2V
2 series · 2 of 2 positions shown · non-contrast
Comparison: 03/07/2020 chest radiograph and prior. 03/07/2020 CT
chest report.

CLINICAL DATA: ICD

EXAM:
CHEST - 2 VIEW

[w chest pa]
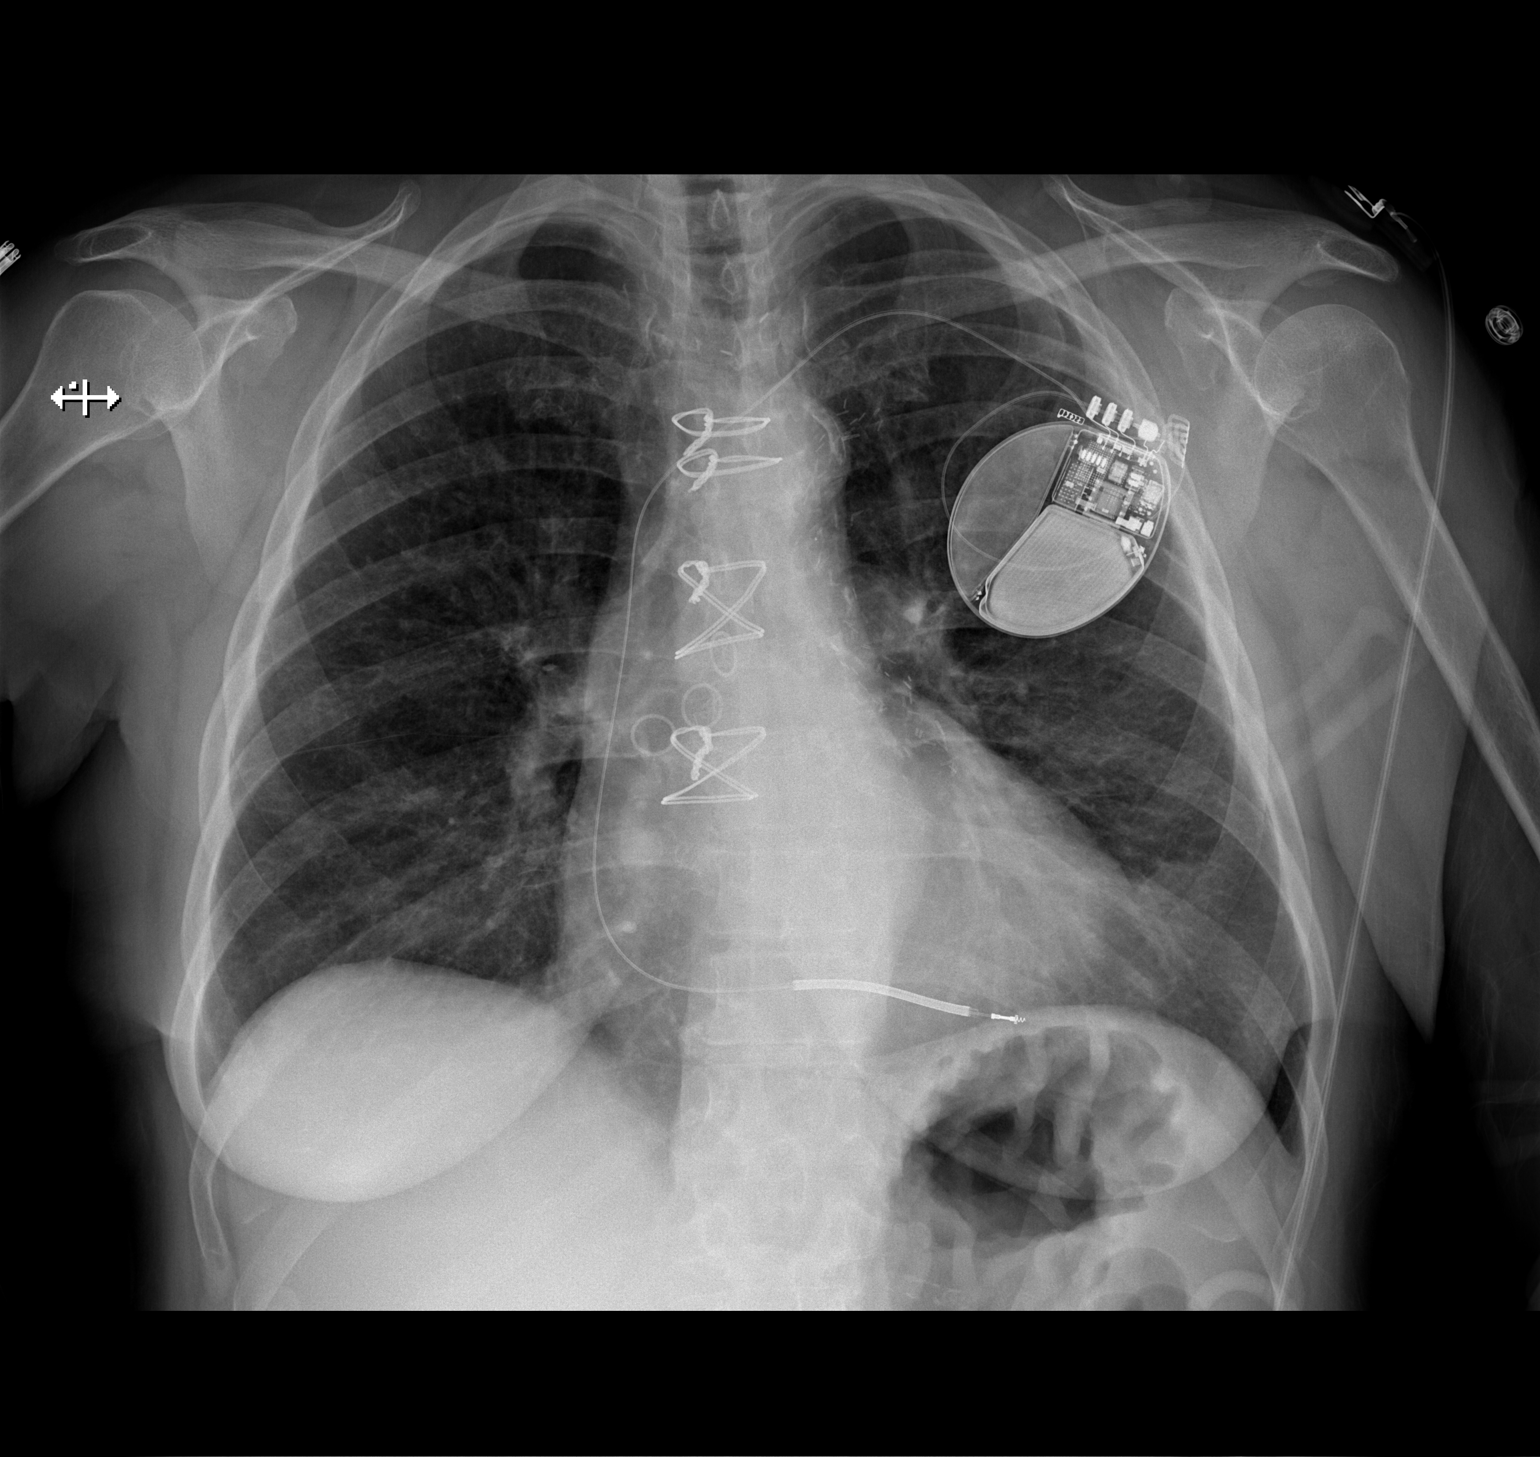

[w chest lat]
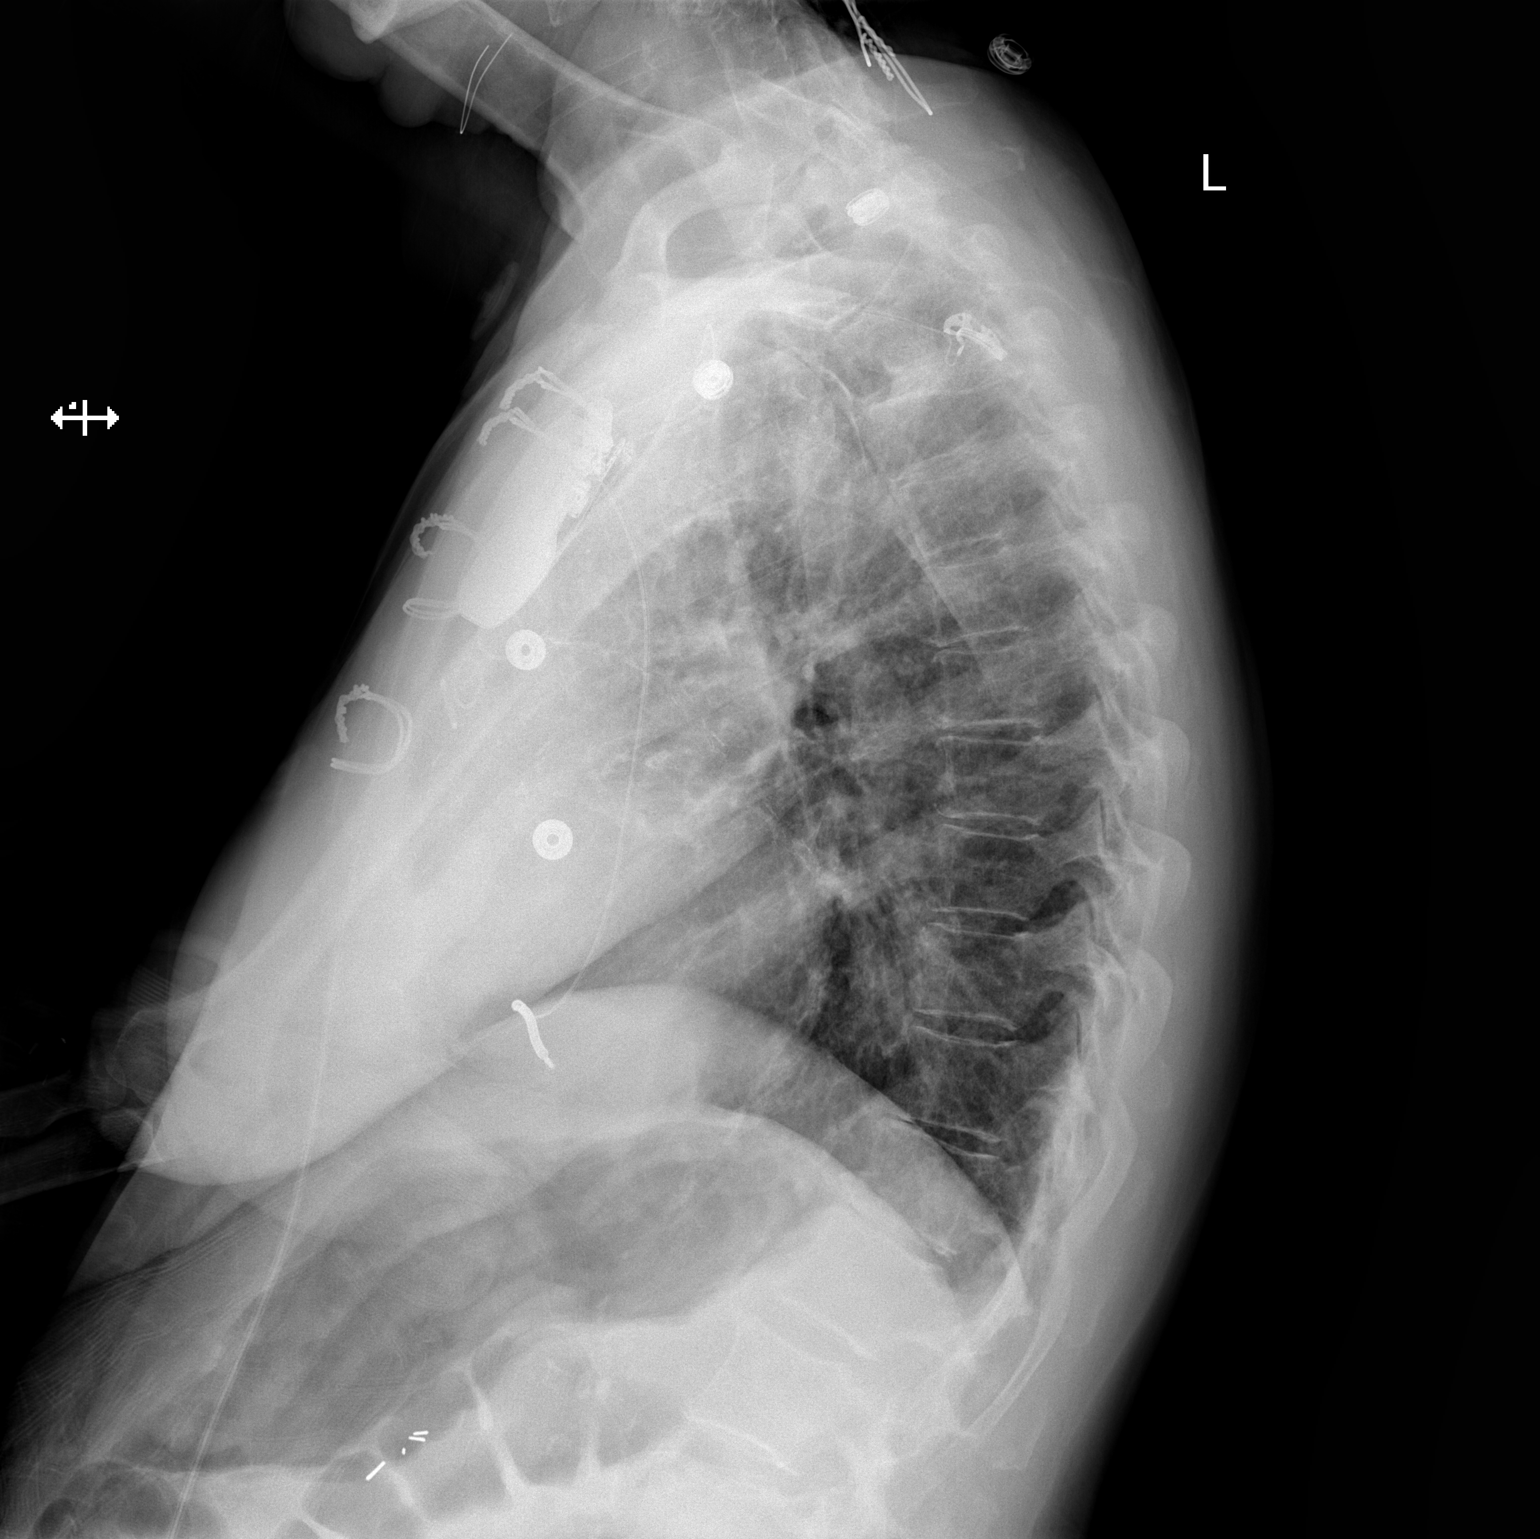

[2 of 2 positions shown; findings below may reference images not displayed]

FINDINGS: Left chest wall pacing device with single lead terminating over the
right heart. Sequela of CABG. No focal airspace opacity,
pneumothorax or pleural effusion. Mild cardiomegaly. No acute
osseous abnormalities.
IMPRESSION: No focal airspace disease.

Indwelling single lead left chest pacing device.

## 2021-07-18 ENCOUNTER — Other Ambulatory Visit (HOSPITAL_BASED_OUTPATIENT_CLINIC_OR_DEPARTMENT_OTHER): Payer: Self-pay | Admitting: Cardiovascular Disease

## 2021-07-18 DIAGNOSIS — I6523 Occlusion and stenosis of bilateral carotid arteries: Secondary | ICD-10-CM

## 2021-07-25 ENCOUNTER — Ambulatory Visit (INDEPENDENT_AMBULATORY_CARE_PROVIDER_SITE_OTHER): Payer: Medicare Other

## 2021-07-25 DIAGNOSIS — I255 Ischemic cardiomyopathy: Secondary | ICD-10-CM

## 2021-07-25 LAB — CUP PACEART REMOTE DEVICE CHECK
Battery Remaining Longevity: 168 mo
Battery Remaining Percentage: 100 %
Brady Statistic RV Percent Paced: 0 %
Date Time Interrogation Session: 20221025041200
HighPow Impedance: 80 Ohm
Implantable Lead Implant Date: 20211015
Implantable Lead Location: 753860
Implantable Lead Model: 672
Implantable Lead Serial Number: 166771
Implantable Pulse Generator Implant Date: 20211015
Lead Channel Impedance Value: 979 Ohm
Lead Channel Setting Pacing Amplitude: 3.5 V
Lead Channel Setting Pacing Pulse Width: 0.4 ms
Lead Channel Setting Sensing Sensitivity: 0.5 mV
Pulse Gen Serial Number: 279742

## 2021-07-31 DIAGNOSIS — E119 Type 2 diabetes mellitus without complications: Secondary | ICD-10-CM | POA: Diagnosis not present

## 2021-07-31 DIAGNOSIS — J449 Chronic obstructive pulmonary disease, unspecified: Secondary | ICD-10-CM | POA: Diagnosis not present

## 2021-07-31 DIAGNOSIS — I5042 Chronic combined systolic (congestive) and diastolic (congestive) heart failure: Secondary | ICD-10-CM | POA: Diagnosis not present

## 2021-08-03 ENCOUNTER — Telehealth: Payer: Self-pay | Admitting: Family Medicine

## 2021-08-03 NOTE — Telephone Encounter (Signed)
Diane [name] with [facility] called stating that pt will no longer take medication iron polysaccharides (NIFEREX) 150 MG capsule because its to expensive.

## 2021-08-03 NOTE — Progress Notes (Signed)
Remote ICD transmission.   

## 2021-08-03 NOTE — Telephone Encounter (Signed)
Her labs look good.  She could stop it and then check cbc/iron in 2-3 months if that is ok

## 2021-08-07 NOTE — Telephone Encounter (Signed)
Called left message for Diane [name] to return call to our office.

## 2021-08-16 ENCOUNTER — Other Ambulatory Visit: Payer: Self-pay

## 2021-08-16 ENCOUNTER — Ambulatory Visit
Admission: RE | Admit: 2021-08-16 | Discharge: 2021-08-16 | Disposition: A | Discharge status: Home / Self Care | Payer: Medicare Other | Source: Ambulatory Visit | Attending: Family Medicine | Admitting: Family Medicine

## 2021-08-16 DIAGNOSIS — M8589 Other specified disorders of bone density and structure, multiple sites: Secondary | ICD-10-CM | POA: Diagnosis not present

## 2021-08-16 DIAGNOSIS — Z780 Asymptomatic menopausal state: Secondary | ICD-10-CM | POA: Diagnosis not present

## 2021-08-16 DIAGNOSIS — E2839 Other primary ovarian failure: Secondary | ICD-10-CM

## 2021-08-22 DIAGNOSIS — Z20828 Contact with and (suspected) exposure to other viral communicable diseases: Secondary | ICD-10-CM | POA: Diagnosis not present

## 2021-08-22 NOTE — Telephone Encounter (Signed)
Left VM requesting pt to call the office back 

## 2021-08-26 NOTE — Progress Notes (Signed)
Please review the chart.  I do not see an upcoming appointment.  Please confirm that we have not seen her in the last several years.

## 2021-08-27 ENCOUNTER — Other Ambulatory Visit: Payer: Self-pay | Admitting: Neurology

## 2021-08-27 DIAGNOSIS — G40009 Localization-related (focal) (partial) idiopathic epilepsy and epileptic syndromes with seizures of localized onset, not intractable, without status epilepticus: Secondary | ICD-10-CM

## 2021-08-28 ENCOUNTER — Other Ambulatory Visit: Payer: Self-pay

## 2021-08-29 ENCOUNTER — Telehealth: Payer: Self-pay | Admitting: Family Medicine

## 2021-08-29 ENCOUNTER — Telehealth: Payer: Self-pay | Admitting: Rheumatology

## 2021-08-29 NOTE — Telephone Encounter (Signed)
Pt would like a call to discuss her BONE DENSITY results from 11.16.22  Please advise

## 2021-08-29 NOTE — Telephone Encounter (Signed)
-----   Message from Shona Needles, RT sent at 08/28/2021 11:23 AM EST ----- Regarding: NEW PATIENT APPT Please call patient to schedule a NP appt w/Dr. Estanislado Pandy to discuss bone density results. Thank you.  ----- Message ----- From: Bo Merino, MD Sent: 08/26/2021   9:18 AM EST To: [facility]  Please review the chart.  I do not see an upcoming appointment.  Please confirm that we have not seen her in the last several years.

## 2021-08-29 NOTE — Telephone Encounter (Signed)
I LMOM for patient to call to schedule a new patient appointment with Dr. Estanislado Pandy to discuss Bone Density results, per Dr. Keturah Barre. Patient last seen 2017.

## 2021-08-30 ENCOUNTER — Other Ambulatory Visit: Payer: Self-pay

## 2021-08-30 ENCOUNTER — Encounter: Payer: Self-pay | Admitting: Pulmonary Disease

## 2021-08-30 ENCOUNTER — Ambulatory Visit (INDEPENDENT_AMBULATORY_CARE_PROVIDER_SITE_OTHER): Payer: Medicare Other | Admitting: Pulmonary Disease

## 2021-08-30 VITALS — BP 110/50 | HR 84 | Temp 97.2°F | Ht <= 58 in | Wt 131.4 lb

## 2021-08-30 DIAGNOSIS — I255 Ischemic cardiomyopathy: Secondary | ICD-10-CM | POA: Diagnosis not present

## 2021-08-30 DIAGNOSIS — J849 Interstitial pulmonary disease, unspecified: Secondary | ICD-10-CM | POA: Diagnosis not present

## 2021-08-30 DIAGNOSIS — J449 Chronic obstructive pulmonary disease, unspecified: Secondary | ICD-10-CM | POA: Diagnosis not present

## 2021-08-30 DIAGNOSIS — J9611 Chronic respiratory failure with hypoxia: Secondary | ICD-10-CM | POA: Diagnosis not present

## 2021-08-30 DIAGNOSIS — E119 Type 2 diabetes mellitus without complications: Secondary | ICD-10-CM | POA: Diagnosis not present

## 2021-08-30 DIAGNOSIS — I5042 Chronic combined systolic (congestive) and diastolic (congestive) heart failure: Secondary | ICD-10-CM | POA: Diagnosis not present

## 2021-08-30 MED ORDER — ALBUTEROL SULFATE HFA 108 (90 BASE) MCG/ACT IN AERS: 2.0000 | INHALATION_SPRAY | Freq: Four times a day (QID) | RESPIRATORY_TRACT | 3 refills | Status: DC | PRN

## 2021-08-30 MED ORDER — FLUTICASONE PROPIONATE 50 MCG/ACT NA SUSP: 2.0000 | Freq: Every day | NASAL | 3 refills | Status: DC

## 2021-08-30 MED ORDER — ALBUTEROL SULFATE (2.5 MG/3ML) 0.083% IN NEBU: 2.5000 mg | INHALATION_SOLUTION | Freq: Four times a day (QID) | RESPIRATORY_TRACT | 3 refills | Status: DC | PRN

## 2021-08-30 NOTE — Progress Notes (Signed)
[facility]  Chief Complaint  Patient presents with   Follow-up    Pt states no concerns want refill on med    Past Surgical History:  She  has a past surgical history that includes Coronary stent placement (08/11/06); Cardiac catheterization (11/29/05); Cardiac catheterization (08/06/06); Coronary artery bypass graft (12/04/2005); Cholecystectomy; Appendectomy; Cervical fusion (1990); Cardiac catheterization (N/A, 05/06/2015); laparotomy (Bilateral, 05/19/2015); Cardiac catheterization (N/A, 11/15/2015); Cardiac catheterization (11/15/2015); Colon resection; Colonoscopy with propofol (N/A, 07/16/2017); CAROTID PTA/STENT INTERVENTION (N/A, 10/10/2017); Breast biopsy (Left, 2016); Esophagogastroduodenoscopy (egd) with propofol (N/A, 02/03/2019); biopsy (02/03/2019); RIGHT HEART CATH (N/A, 01/27/2020); ABDOMINAL AORTOGRAM W/LOWER EXTREMITY (N/A, 04/18/2020); PERIPHERAL VASCULAR BALLOON ANGIOPLASTY (04/18/2020); and ICD IMPLANT (N/A, 07/15/2020).  Past Medical History:  Combined CHF, CAD s/p CABG, CVA, DM, HLD, HTN, Fibromyalgia, GERD, Goiter, Pneumonia, Seizures  Constitutional:  BP (!) 110/50 (BP Location: Right Arm, Patient Position: Sitting, Cuff Size: Normal)   Pulse 84   Temp (!) 97.2 F (36.2 C)   Ht 4\' 9"  (1.448 m)   Wt 131 lb 6.4 oz (59.6 kg)   LMP 09/02/2003   SpO2 99%   BMI 28.43 kg/m   Brief Summary:  Diane [name] is a 65 y.o. female smoker with chronic bronchitis and emphysema and sleep related hypoxia.      Subjective:   I last saw her in 2020.  She had CT chest in June 2021 that showed changed concerning for Langerhans.    She continues to smoke.  She gets winded while walking.  Hasn't been using oxygen at night.  Has cough with clear sputum.  Uses albuterol 3 to 4 times per day.  Physical Exam:   Appearance - well kempt   ENMT - no sinus tenderness, no oral exudate, no LAN, Mallampati 3 airway, no stridor, raspy voice  Respiratory  - decreased breath sounds bilaterally, no wheezing or rales  CV - s1s2 regular rate and rhythm, no murmurs  Ext - no clubbing, no edema  Skin - no rashes  Psych - normal mood and affect   Pulmonary testing:  PFT 05/12/12>>FEV1 1.83 (98%), FEV1% 80, TLC 3.83 (98%), DLCO 61% (corrects for lung volumes), no BD response PFT 06/30/14 >> FEV1 1.80 (93%), FEV1% 86, TLC 4.35 (108%), DLCO 82%, no BD Spirometry 05/11/16 >> FEV1 1.74 (89%), FEV1% 76  PFT 07/15/18 >> FEV1 1.91 (109%), FEV1% 87, TLC 4.34 (111%), DLCO 81%  Chest Imaging:  LDCT Chest 06/25/18 >> several small nodules b/l, bronchial wall thickening, mild centrilobular and paraseptal emphysema CT chest 03/07/20 >> multiple areas of centrilobular nodularity in lung apices (favor Langerhans cell histiocytosis)  Sleep Tests:  PSG 04/10/06 >> RDI 105  CPAP 06/13/11 to 07/10/11>>Used on 20 of 28 nights with median 3 hrs 45 min.  Average AHI 5 with median CPAP 7 cm H2O and 95th percentile CPAP 10 cm H2O. PSG 05/15/16 >> AHI 1.9, SpO2 low 83% ONO with RA 07/27/16 >> test time 6 hrs 25 min.  Mean SpO2 91.04%, low SpO2 68%.  Spent 18 min with SpO2 < 88%. ONO with RA 05/12/19 >> test time 8 hrs 54 min.  Baseline SpO2 90%, low SpO2 78%.  Spent 2 hrs 50 min with SpO2 < 88%.  Cardiac tests:  Echo 02/17/21 >> EF 45 to 50%, grade 1 DD, RVSP 22.9 mmHg, mild AS, aortic root 40 mm   Social History:  She  reports that she has been smoking cigarettes. She started smoking about 55 years ago. She  has a 51.00 pack-year smoking history. She has never used smokeless tobacco. She reports that she does not drink alcohol and does not use drugs.  Family History:  Her family history includes COPD in her mother; Cancer in her father, paternal uncle, and sister; Diabetes in her mother; Drug abuse in her paternal grandmother; Glaucoma in her sister; Heart attack in her father; Heart disease in her maternal aunt, maternal grandfather, and mother; Hyperlipidemia in her  mother; Hypertension in her mother and sister; Lung cancer in her paternal aunt and paternal uncle; Melanoma in her paternal uncle; Stroke in her paternal grandfather.     Assessment/Plan:   Radiographic findings from 2021 concerning for Langerhans histiocytosis. - will arrange for high resolution CT chest and pulmonary function test  Chronic bronchitis, centrilobular emphysema and paraseptal emphysema. - she has tried ICS, LAMA, LABA inhalers in the past w/o significant improvement - continue prn albuterol - might need to revisit her inhaler regimen depending on test findings   Chronic respiratory failure with hypoxia. - hasn't been using nocturnal oxygen - will arrange for overnight oximetry on room air and then discuss whether she should resume using oxygen at night   Tobacco abuse. - had extensive d/w her about options for smoking cessation - she will consider her options    Time Spent Involved in Patient Care on Day of Examination:  34 minutes  Follow up:   Patient Instructions  Will arrange for high resolution CT chest, pulmonary function test and overnight oxygen test  Follow up in 3 to 4 weeks with Dr. Halford Chessman or Nurse Practitioner  Medication List:   Allergies as of 08/30/2021       Reactions   Bee Venom Itching, Swelling   Benadryl [diphenhydramine] Other (See Comments)   Insomnia    Entresto [sacubitril-valsartan] Cough   Farxiga [dapagliflozin]    Yeast infection   Tetracycline    Unknown, pt cannot recall exact reaction   Canagliflozin Other (See Comments)   Other Other (See Comments)   Sitagliptin Other (See Comments)   Ace Inhibitors Cough        Medication List        Accurate as of August 30, 2021  3:55 PM. If you have any questions, ask your nurse or doctor.          acetaminophen 500 MG tablet Commonly known as: TYLENOL Take 1,000 mg by mouth every 6 (six) hours as needed for moderate pain or headache.   albuterol (2.5 MG/3ML)  0.083% nebulizer solution Commonly known as: PROVENTIL Take 3 mLs (2.5 mg total) by nebulization every 6 (six) hours as needed for wheezing or shortness of breath. What changed: You were already taking a medication with the same name, and this prescription was added. Make sure you understand how and when to take each. Changed by: [name], MD   albuterol 108 (90 Base) MCG/ACT inhaler Commonly known as: VENTOLIN HFA Inhale 2 puffs into the lungs every 6 (six) hours as needed for wheezing or shortness of breath. What changed: See the new instructions. Changed by: [name], MD   aspirin EC 81 MG tablet Take 81 mg by mouth every evening.   clopidogrel 75 MG tablet Commonly known as: PLAVIX Take 1 tablet (75 mg total) by mouth daily.   dexlansoprazole 60 MG capsule Commonly known as: DEXILANT TAKE 1 CAPSULE DAILY   digoxin 0.125 MG tablet Commonly known as: LANOXIN Take 0.5 tablets (0.0625 mg total) by mouth daily.   diphenhydrAMINE  25 mg capsule Commonly known as: BENADRYL Take 25 mg by mouth 2 (two) times daily as needed for itching or allergies.   ezetimibe 10 MG tablet Commonly known as: ZETIA Take 1 tablet (10 mg total) by mouth daily.   fluticasone 50 MCG/ACT nasal spray Commonly known as: FLONASE Place 2 sprays into both nostrils daily.   gabapentin 300 MG capsule Commonly known as: NEURONTIN TAKE 2 CAPSULES BY MOUTH EVERY DAY AT BEDTIME   guaiFENesin 600 MG 12 hr tablet Commonly known as: MUCINEX Take 2 tablets (1,200 mg total) by mouth 2 (two) times daily.   HYDROcodone-acetaminophen 5-325 MG tablet Commonly known as: Norco Take 1 tablet by mouth daily as needed for severe pain.   icosapent Ethyl 1 g capsule Commonly known as: Vascepa TAKE 2 CAPSULES BY MOUTH 2 TIMES DAILY.   insulin aspart protamine- aspart (70-30) 100 UNIT/ML injection Commonly known as: NOVOLOG MIX 70/30 Inject 65 Units into the skin daily with breakfast. 60 units at night    iron polysaccharides 150 MG capsule Commonly known as: NIFEREX 3 times per week   Klor-Con M20 20 MEQ tablet Generic drug: potassium chloride SA TAKE 1 TABLET TWICE A DAY   levETIRAcetam 1000 MG tablet Commonly known as: KEPPRA TAKE 1 TABLET TWICE A DAY   metFORMIN 500 MG 24 hr tablet Commonly known as: GLUCOPHAGE-XR Take 500 mg by mouth every evening.   metolazone 2.5 MG tablet Commonly known as: ZAROXOLYN Take 1 tablet (2.5 mg total) by mouth 2 (two) times a week. Every Mon and Fri   nitroGLYCERIN 0.4 MG/SPRAY spray Commonly known as: NITROLINGUAL Place 1 spray under the tongue every 5 (five) minutes x 3 doses as needed for chest pain.   nystatin powder Commonly known as: MYCOSTATIN/NYSTOP APPLY 1 APPLICATION        TOPICALLY 4 TIMES A DAY AS NEEDED FOR YEAST INFECTIONS   OneTouch Ultra test strip Generic drug: glucose blood 3 (three) times daily.   ONE TOUCH ULTRA TEST test strip Generic drug: glucose blood USE AS DIRECTED FOR TESTING BLOOD GLUCOSE 3 TIMES DAILY   polyethylene glycol 17 g packet Commonly known as: MIRALAX / GLYCOLAX Take 17 g by mouth daily.   promethazine 25 MG tablet Commonly known as: PHENERGAN Take 1 tablet (25 mg total) by mouth every 8 (eight) hours as needed for nausea or vomiting. Caution of sedation   ranolazine 1000 MG SR tablet Commonly known as: RANEXA Take 1 tablet (1,000 mg total) by mouth 2 (two) times daily.   rosuvastatin 20 MG tablet Commonly known as: CRESTOR Take 1 tablet (20 mg total) by mouth daily.   sodium chloride 0.65 % Soln nasal spray Commonly known as: OCEAN Place 1 spray into both nostrils as needed for congestion.   spironolactone 25 MG tablet Commonly known as: ALDACTONE TAKE 1 TABLET BY MOUTH EVERYDAY AT BEDTIME   torsemide 20 MG tablet Commonly known as: DEMADEX Take 3 tablets (60 mg total) by mouth every morning AND 2 tablets (40 mg total) every evening.   triamcinolone cream 0.5 % Commonly known  as: KENALOG Apply 1 application topically 3 (three) times daily. To affected areas   Vericiguat 10 MG Tabs TAKE 1 TABLET BY MOUTH ONCE DAILY        Signature:  [name], MD [place] Pager - [phone] 08/30/2021, 3:55 PM

## 2021-08-30 NOTE — Patient Instructions (Signed)
Will arrange for high resolution CT chest, pulmonary function test and overnight oxygen test  Follow up in 3 to 4 weeks with Dr. Halford Chessman or Nurse Practitioner

## 2021-08-30 NOTE — H&P (View-Only) (Signed)
Pulmonary, Critical Care, and Sleep Medicine  Chief Complaint  Patient presents with   Follow-up    Pt states no concerns want refill on med    Past Surgical History:  She  has a past surgical history that includes Coronary stent placement (08/11/06); Cardiac catheterization (11/29/05); Cardiac catheterization (08/06/06); Coronary artery bypass graft (12/04/2005); Cholecystectomy; Appendectomy; Cervical fusion (1990); Cardiac catheterization (N/A, 05/06/2015); laparotomy (Bilateral, 05/19/2015); Cardiac catheterization (N/A, 11/15/2015); Cardiac catheterization (11/15/2015); Colon resection; Colonoscopy with propofol (N/A, 07/16/2017); CAROTID PTA/STENT INTERVENTION (N/A, 10/10/2017); Breast biopsy (Left, 2016); Esophagogastroduodenoscopy (egd) with propofol (N/A, 02/03/2019); biopsy (02/03/2019); RIGHT HEART CATH (N/A, 01/27/2020); ABDOMINAL AORTOGRAM W/LOWER EXTREMITY (N/A, 04/18/2020); PERIPHERAL VASCULAR BALLOON ANGIOPLASTY (04/18/2020); and ICD IMPLANT (N/A, 07/15/2020).  Past Medical History:  Combined CHF, CAD s/p CABG, CVA, DM, HLD, HTN, Fibromyalgia, GERD, Goiter, Pneumonia, Seizures  Constitutional:  BP (!) 110/50 (BP Location: Right Arm, Patient Position: Sitting, Cuff Size: Normal)    Pulse 84    Temp (!) 97.2 F (36.2 C)    Ht 4\' 9"  (1.448 m)    Wt 131 lb 6.4 oz (59.6 kg)    LMP 09/02/2003    SpO2 99%    BMI 28.43 kg/m   Brief Summary:  Diane [name] is a 65 y.o. female smoker with chronic bronchitis and emphysema and sleep related hypoxia.      Subjective:   I last saw her in 2020.  She had CT chest in June 2021 that showed changed concerning for Langerhans.    She continues to smoke.  She gets winded while walking.  Hasn't been using oxygen at night.  Has cough with clear sputum.  Uses albuterol 3 to 4 times per day.  Physical Exam:   Appearance - well kempt   ENMT - no sinus tenderness, no oral exudate, no LAN, Mallampati 3 airway, no stridor, raspy voice  Respiratory  - decreased breath sounds bilaterally, no wheezing or rales  CV - s1s2 regular rate and rhythm, no murmurs  Ext - no clubbing, no edema  Skin - no rashes  Psych - normal mood and affect   Pulmonary testing:  PFT 05/12/12>>FEV1 1.83 (98%), FEV1% 80, TLC 3.83 (98%), DLCO 61% (corrects for lung volumes), no BD response PFT 06/30/14 >> FEV1 1.80 (93%), FEV1% 86, TLC 4.35 (108%), DLCO 82%, no BD Spirometry 05/11/16 >> FEV1 1.74 (89%), FEV1% 76  PFT 07/15/18 >> FEV1 1.91 (109%), FEV1% 87, TLC 4.34 (111%), DLCO 81%  Chest Imaging:  LDCT Chest 06/25/18 >> several small nodules b/l, bronchial wall thickening, mild centrilobular and paraseptal emphysema CT chest 03/07/20 >> multiple areas of centrilobular nodularity in lung apices (favor Langerhans cell histiocytosis)  Sleep Tests:  PSG 04/10/06 >> RDI 105  CPAP 06/13/11 to 07/10/11>>Used on 20 of 28 nights with median 3 hrs 45 min.  Average AHI 5 with median CPAP 7 cm H2O and 95th percentile CPAP 10 cm H2O. PSG 05/15/16 >> AHI 1.9, SpO2 low 83% ONO with RA 07/27/16 >> test time 6 hrs 25 min.  Mean SpO2 91.04%, low SpO2 68%.  Spent 18 min with SpO2 < 88%. ONO with RA 05/12/19 >> test time 8 hrs 54 min.  Baseline SpO2 90%, low SpO2 78%.  Spent 2 hrs 50 min with SpO2 < 88%.  Cardiac tests:  Echo 02/17/21 >> EF 45 to 50%, grade 1 DD, RVSP 22.9 mmHg, mild AS, aortic root 40 mm   Social History:  She  reports that she has been smoking cigarettes. She  started smoking about 55 years ago. She has a 51.00 pack-year smoking history. She has never used smokeless tobacco. She reports that she does not drink alcohol and does not use drugs.  Family History:  Her family history includes COPD in her mother; Cancer in her father, paternal uncle, and sister; Diabetes in her mother; Drug abuse in her paternal grandmother; Glaucoma in her sister; Heart attack in her father; Heart disease in her maternal aunt, maternal grandfather, and mother; Hyperlipidemia in her  mother; Hypertension in her mother and sister; Lung cancer in her paternal aunt and paternal uncle; Melanoma in her paternal uncle; Stroke in her paternal grandfather.     Assessment/Plan:   Radiographic findings from 2021 concerning for Langerhans histiocytosis. - will arrange for high resolution CT chest and pulmonary function test  Chronic bronchitis, centrilobular emphysema and paraseptal emphysema. - she has tried ICS, LAMA, LABA inhalers in the past w/o significant improvement - continue prn albuterol - might need to revisit her inhaler regimen depending on test findings   Chronic respiratory failure with hypoxia. - hasn't been using nocturnal oxygen - will arrange for overnight oximetry on room air and then discuss whether she should resume using oxygen at night   Tobacco abuse. - had extensive d/w her about options for smoking cessation - she will consider her options    Time Spent Involved in Patient Care on Day of Examination:  34 minutes  Follow up:   Patient Instructions  Will arrange for high resolution CT chest, pulmonary function test and overnight oxygen test  Follow up in 3 to 4 weeks with Dr. Halford Chessman or Nurse Practitioner  Medication List:   Allergies as of 08/30/2021       Reactions   Bee Venom Itching, Swelling   Benadryl [diphenhydramine] Other (See Comments)   Insomnia    Entresto [sacubitril-valsartan] Cough   Farxiga [dapagliflozin]    Yeast infection   Tetracycline    Unknown, pt cannot recall exact reaction   Canagliflozin Other (See Comments)   Other Other (See Comments)   Sitagliptin Other (See Comments)   Ace Inhibitors Cough        Medication List        Accurate as of August 30, 2021  3:55 PM. If you have any questions, ask your nurse or doctor.          acetaminophen 500 MG tablet Commonly known as: TYLENOL Take 1,000 mg by mouth every 6 (six) hours as needed for moderate pain or headache.   albuterol (2.5 MG/3ML)  0.083% nebulizer solution Commonly known as: PROVENTIL Take 3 mLs (2.5 mg total) by nebulization every 6 (six) hours as needed for wheezing or shortness of breath. What changed: You were already taking a medication with the same name, and this prescription was added. Make sure you understand how and when to take each. Changed by: [name], MD   albuterol 108 (90 Base) MCG/ACT inhaler Commonly known as: VENTOLIN HFA Inhale 2 puffs into the lungs every 6 (six) hours as needed for wheezing or shortness of breath. What changed: See the new instructions. Changed by: [name], MD   aspirin EC 81 MG tablet Take 81 mg by mouth every evening.   clopidogrel 75 MG tablet Commonly known as: PLAVIX Take 1 tablet (75 mg total) by mouth daily.   dexlansoprazole 60 MG capsule Commonly known as: DEXILANT TAKE 1 CAPSULE DAILY   digoxin 0.125 MG tablet Commonly known as: LANOXIN Take 0.5 tablets (0.0625 mg  total) by mouth daily.   diphenhydrAMINE 25 mg capsule Commonly known as: BENADRYL Take 25 mg by mouth 2 (two) times daily as needed for itching or allergies.   ezetimibe 10 MG tablet Commonly known as: ZETIA Take 1 tablet (10 mg total) by mouth daily.   fluticasone 50 MCG/ACT nasal spray Commonly known as: FLONASE Place 2 sprays into both nostrils daily.   gabapentin 300 MG capsule Commonly known as: NEURONTIN TAKE 2 CAPSULES BY MOUTH EVERY DAY AT BEDTIME   guaiFENesin 600 MG 12 hr tablet Commonly known as: MUCINEX Take 2 tablets (1,200 mg total) by mouth 2 (two) times daily.   HYDROcodone-acetaminophen 5-325 MG tablet Commonly known as: Norco Take 1 tablet by mouth daily as needed for severe pain.   icosapent Ethyl 1 g capsule Commonly known as: Vascepa TAKE 2 CAPSULES BY MOUTH 2 TIMES DAILY.   insulin aspart protamine- aspart (70-30) 100 UNIT/ML injection Commonly known as: NOVOLOG MIX 70/30 Inject 65 Units into the skin daily with breakfast. 60 units at night    iron polysaccharides 150 MG capsule Commonly known as: NIFEREX 3 times per week   Klor-Con M20 20 MEQ tablet Generic drug: potassium chloride SA TAKE 1 TABLET TWICE A DAY   levETIRAcetam 1000 MG tablet Commonly known as: KEPPRA TAKE 1 TABLET TWICE A DAY   metFORMIN 500 MG 24 hr tablet Commonly known as: GLUCOPHAGE-XR Take 500 mg by mouth every evening.   metolazone 2.5 MG tablet Commonly known as: ZAROXOLYN Take 1 tablet (2.5 mg total) by mouth 2 (two) times a week. Every Mon and Fri   nitroGLYCERIN 0.4 MG/SPRAY spray Commonly known as: NITROLINGUAL Place 1 spray under the tongue every 5 (five) minutes x 3 doses as needed for chest pain.   nystatin powder Commonly known as: MYCOSTATIN/NYSTOP APPLY 1 APPLICATION        TOPICALLY 4 TIMES A DAY AS NEEDED FOR YEAST INFECTIONS   OneTouch Ultra test strip Generic drug: glucose blood 3 (three) times daily.   ONE TOUCH ULTRA TEST test strip Generic drug: glucose blood USE AS DIRECTED FOR TESTING BLOOD GLUCOSE 3 TIMES DAILY   polyethylene glycol 17 g packet Commonly known as: MIRALAX / GLYCOLAX Take 17 g by mouth daily.   promethazine 25 MG tablet Commonly known as: PHENERGAN Take 1 tablet (25 mg total) by mouth every 8 (eight) hours as needed for nausea or vomiting. Caution of sedation   ranolazine 1000 MG SR tablet Commonly known as: RANEXA Take 1 tablet (1,000 mg total) by mouth 2 (two) times daily.   rosuvastatin 20 MG tablet Commonly known as: CRESTOR Take 1 tablet (20 mg total) by mouth daily.   sodium chloride 0.65 % Soln nasal spray Commonly known as: OCEAN Place 1 spray into both nostrils as needed for congestion.   spironolactone 25 MG tablet Commonly known as: ALDACTONE TAKE 1 TABLET BY MOUTH EVERYDAY AT BEDTIME   torsemide 20 MG tablet Commonly known as: DEMADEX Take 3 tablets (60 mg total) by mouth every morning AND 2 tablets (40 mg total) every evening.   triamcinolone cream 0.5 % Commonly known  as: KENALOG Apply 1 application topically 3 (three) times daily. To affected areas   Vericiguat 10 MG Tabs TAKE 1 TABLET BY MOUTH ONCE DAILY        Signature:  [name], MD [place] Pager - [phone] 08/30/2021, 3:55 PM

## 2021-09-04 DIAGNOSIS — H524 Presbyopia: Secondary | ICD-10-CM | POA: Diagnosis not present

## 2021-09-04 DIAGNOSIS — E113293 Type 2 diabetes mellitus with mild nonproliferative diabetic retinopathy without macular edema, bilateral: Secondary | ICD-10-CM | POA: Diagnosis not present

## 2021-09-04 DIAGNOSIS — E119 Type 2 diabetes mellitus without complications: Secondary | ICD-10-CM | POA: Diagnosis not present

## 2021-09-04 DIAGNOSIS — H2513 Age-related nuclear cataract, bilateral: Secondary | ICD-10-CM | POA: Diagnosis not present

## 2021-09-04 DIAGNOSIS — H5201 Hypermetropia, right eye: Secondary | ICD-10-CM | POA: Diagnosis not present

## 2021-09-04 DIAGNOSIS — H52223 Regular astigmatism, bilateral: Secondary | ICD-10-CM | POA: Diagnosis not present

## 2021-09-04 DIAGNOSIS — E11319 Type 2 diabetes mellitus with unspecified diabetic retinopathy without macular edema: Secondary | ICD-10-CM | POA: Diagnosis not present

## 2021-09-04 LAB — HM DIABETES EYE EXAM

## 2021-09-07 DIAGNOSIS — E119 Type 2 diabetes mellitus without complications: Secondary | ICD-10-CM | POA: Diagnosis not present

## 2021-09-07 DIAGNOSIS — E1121 Type 2 diabetes mellitus with diabetic nephropathy: Secondary | ICD-10-CM | POA: Diagnosis not present

## 2021-09-07 DIAGNOSIS — G629 Polyneuropathy, unspecified: Secondary | ICD-10-CM | POA: Diagnosis not present

## 2021-09-07 DIAGNOSIS — I509 Heart failure, unspecified: Secondary | ICD-10-CM | POA: Diagnosis not present

## 2021-09-07 DIAGNOSIS — E7800 Pure hypercholesterolemia, unspecified: Secondary | ICD-10-CM | POA: Diagnosis not present

## 2021-09-07 DIAGNOSIS — I10 Essential (primary) hypertension: Secondary | ICD-10-CM | POA: Diagnosis not present

## 2021-09-07 DIAGNOSIS — E041 Nontoxic single thyroid nodule: Secondary | ICD-10-CM | POA: Diagnosis not present

## 2021-09-08 ENCOUNTER — Other Ambulatory Visit: Payer: Self-pay

## 2021-09-08 ENCOUNTER — Ambulatory Visit (HOSPITAL_COMMUNITY)
Admission: RE | Admit: 2021-09-08 | Discharge: 2021-09-08 | Disposition: A | Discharge status: Home / Self Care | Payer: Medicare Other | Source: Ambulatory Visit | Attending: Pulmonary Disease | Admitting: Pulmonary Disease

## 2021-09-08 DIAGNOSIS — J849 Interstitial pulmonary disease, unspecified: Secondary | ICD-10-CM | POA: Insufficient documentation

## 2021-09-08 DIAGNOSIS — J432 Centrilobular emphysema: Secondary | ICD-10-CM | POA: Diagnosis not present

## 2021-09-08 NOTE — Telephone Encounter (Signed)
Left message for patient to return call to office at [phone]

## 2021-09-11 ENCOUNTER — Telehealth: Payer: Self-pay | Admitting: Pulmonary Disease

## 2021-09-11 DIAGNOSIS — R9389 Abnormal findings on diagnostic imaging of other specified body structures: Secondary | ICD-10-CM

## 2021-09-11 NOTE — Telephone Encounter (Signed)
Spoke with Malachy Mood at [facility]. She was calling about the HRCT that was completed on 12/08. Radiologist is concerned about the first impression:   IMPRESSION: 1. Again noted is a spectrum of findings suggestive of a smoking related disease such as Langerhans cell histiocytosis. However, today's study also demonstrates the presence of multiple new pulmonary nodules in the right upper lobe, largest of which measures 1.5 x 1.1 cm, associated with bulky right hilar and right paratracheal lymphadenopathy. Findings are highly concerning for primary bronchogenic malignancy. Further evaluation with nonemergent PET-CT is strongly recommended in the near future to better evaluate these findings and assess for distal metastatic disease. 2. New left adrenal nodule measuring 2.0 x 1.9 cm, concerning for potential metastatic lesion. 3. Mild centrilobular and paraseptal emphysema. 4. Aortic atherosclerosis, in addition to left main and 3 vessel coronary artery disease. Status post median sternotomy for CABG including LIMA to the LAD. 5. There are calcifications of the aortic valve. Echocardiographic correlation for evaluation of potential valvular dysfunction may be warranted if clinically indicated. 6. Additional incidental findings, as above.  Dr. Halford Chessman, can you please advise? Thanks!

## 2021-09-11 NOTE — Telephone Encounter (Signed)
CT Chest High Resolution  Result Date: 09/11/2021 CLINICAL DATA:  65 year old female with history of chronic bronchitis and emphysema. Evaluate for interstitial lung disease. EXAM: CT CHEST WITHOUT CONTRAST TECHNIQUE: Multidetector CT imaging of the chest was performed following the standard protocol without intravenous contrast. High resolution imaging of the lungs, as well as inspiratory and expiratory imaging, was performed. COMPARISON:  Chest CT 07/10/2019. FINDINGS: Cardiovascular: Heart size is normal. There is no significant pericardial fluid, thickening or pericardial calcification. There is aortic atherosclerosis, as well as atherosclerosis of the great vessels of the mediastinum and the coronary arteries, including calcified atherosclerotic plaque in the left main, left anterior descending, left circumflex and right coronary arteries. Status post median sternotomy for CABG including LIMA to the LAD. Focal penetrating ulcer of the descending thoracic aorta (axial image 93 of series 2) measuring 1.0 x 1.6 cm, minimally increased compared to the prior examination. Calcifications of the aortic valve. Mediastinum/Nodes: New bulky lymphadenopathy in right hilar and right paratracheal nodal stations measuring up to 2.1 cm in short axis in the right hilar region (axial image 65 of series 2) and 1.7 cm in short axis in the right paratracheal nodal station (axial image 49 of series 2). No left hilar lymphadenopathy. Esophagus is unremarkable in appearance. No axillary lymphadenopathy. Lungs/Pleura: Several new right upper lobe pulmonary nodules are noted on today's examination, largest of which measures 1.5 x 1.1 cm (axial image 100 of series 4). No acute consolidative airspace disease. No pleural effusions. High-resolution images demonstrate no compelling areas of ground-glass attenuation, septal thickening, subpleural reticulation or honeycombing to clearly indicate the presence of interstitial lung disease.  There is diffuse bronchial wall thickening with mild centrilobular and paraseptal emphysema, as well as some scattered small thick-walled cysts and micronodules most evident throughout the mid to upper lungs bilaterally. Upper Abdomen: 2.0 x 1.9 cm left adrenal nodule, new compared to the prior examination. Low-attenuation adrenal form thickening of the right adrenal gland, similar to the prior study, likely to reflect adenomatous hyperplasia. Status post cholecystectomy. Aortic atherosclerosis. Musculoskeletal: Median sternotomy wires. There are no aggressive appearing lytic or blastic lesions noted in the visualized portions of the skeleton. IMPRESSION: 1. Again noted is a spectrum of findings suggestive of a smoking related disease such as Langerhans cell histiocytosis. However, today's study also demonstrates the presence of multiple new pulmonary nodules in the right upper lobe, largest of which measures 1.5 x 1.1 cm, associated with bulky right hilar and right paratracheal lymphadenopathy. Findings are highly concerning for primary bronchogenic malignancy. Further evaluation with nonemergent PET-CT is strongly recommended in the near future to better evaluate these findings and assess for distal metastatic disease. 2. New left adrenal nodule measuring 2.0 x 1.9 cm, concerning for potential metastatic lesion. 3. Mild centrilobular and paraseptal emphysema. 4. Aortic atherosclerosis, in addition to left main and 3 vessel coronary artery disease. Status post median sternotomy for CABG including LIMA to the LAD. 5. There are calcifications of the aortic valve. Echocardiographic correlation for evaluation of potential valvular dysfunction may be warranted if clinically indicated. 6. Additional incidental findings, as above. These results will be called to the ordering clinician or representative by the Radiologist Assistant, and communication documented in the PACS or [employer]. Aortic Atherosclerosis  (ICD10-I70.0) and Emphysema (ICD10-J43.9). Electronically Signed   By: Vinnie Langton M.D.   On: 09/11/2021 10:06     Results d/w pt.  Explained that she likely still has Langerhans, but not has new mediastinal LAN and pulmonary nodules.  This is concerning for primary lung malignancy.  She will need bronchoscopy with EBUS and transbronchial biopsy.  Will have Dr. Lamonte Sakai review and then arrange for procedure.

## 2021-09-12 NOTE — Telephone Encounter (Signed)
Request made to arrange ENB + EBUS on 12/19

## 2021-09-13 NOTE — Telephone Encounter (Signed)
Thank you :)

## 2021-09-13 NOTE — Telephone Encounter (Signed)
I have scheduled pt for 12/19 at 1:00.  She will go for covid test on 12/16.  I called [facility] and spoke to Brandon about sending disc to [facility].  I had to leave a vm for pt to call me back for appt info.

## 2021-09-13 NOTE — Telephone Encounter (Signed)
LMTCB to schedule a lab appt

## 2021-09-13 NOTE — Telephone Encounter (Signed)
Patient has been notified. She will need to make a lab appt. Please call and schedule.

## 2021-09-13 NOTE — Telephone Encounter (Signed)
I spoke to pt & gave her appt info.

## 2021-09-14 NOTE — Telephone Encounter (Signed)
2nd attempt  LMTCB to schedule a lab appt

## 2021-09-15 ENCOUNTER — Encounter: Payer: Self-pay | Admitting: Cardiology

## 2021-09-15 ENCOUNTER — Other Ambulatory Visit: Payer: Self-pay | Admitting: Emergency Medicine

## 2021-09-15 ENCOUNTER — Other Ambulatory Visit: Payer: Self-pay

## 2021-09-15 ENCOUNTER — Encounter (HOSPITAL_COMMUNITY): Payer: Self-pay | Admitting: Emergency Medicine

## 2021-09-15 DIAGNOSIS — E119 Type 2 diabetes mellitus without complications: Secondary | ICD-10-CM | POA: Diagnosis not present

## 2021-09-15 DIAGNOSIS — H25812 Combined forms of age-related cataract, left eye: Secondary | ICD-10-CM | POA: Diagnosis not present

## 2021-09-15 DIAGNOSIS — Z01818 Encounter for other preprocedural examination: Secondary | ICD-10-CM | POA: Diagnosis not present

## 2021-09-15 DIAGNOSIS — H25811 Combined forms of age-related cataract, right eye: Secondary | ICD-10-CM | POA: Diagnosis not present

## 2021-09-15 LAB — SARS CORONAVIRUS 2 (TAT 6-24 HRS): SARS Coronavirus 2: NEGATIVE

## 2021-09-15 NOTE — Progress Notes (Signed)
Pt has a very extensive cardiac history. Dr. Aundra Dubin is her cardiologist. Pt does have an ICD. Sent to [facility] [facility] for instructions - have magnet available. Form is in chart. Pt was instructed to hold her Plavix for 5 days prior to the procedure. Last dose was 09/13/21. She is to continue the Aspirin.   Pt is a type 2 diabetic. Pt states her last A1C was 7.6 two weeks ago. States her fasting blood sugar runs between 70-170. Instructed pt take 70% of her Novolog 70/30 Insulin mix Sunday AM. She is not to the Novolog 70/30 the day of surgery. Instructed her to check her blood sugar when she wakes up Monday AM and every 2 hours until she leaves for the hospital. If blood sugar is 70 or below, treat with 1/2 cup of clear juice (apple or cranberry) and recheck blood sugar 15 minutes after drinking juice. If blood sugar continues to be 70 or below, call the Short Stay department and ask to speak to a nurse. Pt voiced understanding.   Covid test done today. Pt understands to wear her mask when out in public and around people that don't normally live with her.  Chart sent to Anesthesia PA.

## 2021-09-15 NOTE — Anesthesia Preprocedure Evaluation (Addendum)
Anesthesia Evaluation  Patient identified by MRN, date of birth, ID band Patient awake    Reviewed: Allergy & Precautions, H&P , NPO status , Patient's Chart, lab work & pertinent test results  Airway Mallampati: II   Neck ROM: full    Dental   Pulmonary shortness of breath, sleep apnea , COPD, Current Smoker,    breath sounds clear to auscultation       Cardiovascular hypertension, + CAD, + Past MI, + Cardiac Stents, + CABG and +CHF   Rhythm:regular Rate:Normal  Echo 02/17/21: IMPRESSIONS  1. Left ventricular ejection fraction, by estimation, is 45 to 50%. The  left ventricle has mildly decreased function. The left ventricle  demonstrates regional wall motion abnormalities, basal to mid  anterolateral and inferolateral hypokinesis. Left  ventricular diastolic parameters are consistent with Grade I diastolic  dysfunction (impaired relaxation).  2. Right ventricular systolic function is normal. The right ventricular  size is normal. There is normal pulmonary artery systolic pressure. The  estimated right ventricular systolic pressure is 15.0 mmHg.  3. The mitral valve is normal in structure. Trivial mitral valve  regurgitation. No evidence of mitral stenosis.  4. The aortic valve is tricuspid. Aortic valve regurgitation is not  visualized. Mild aortic valve stenosis. Aortic valve mean gradient  measures 10.0 mmHg.  5. Aortic dilatation noted. There is mild dilatation of the ascending  aorta, measuring 40 mm.  6. The inferior vena cava is normal in size with greater than 50%  respiratory variability, suggesting right atrial pressure of 3 mmHg.    Neuro/Psych  Headaches, Seizures -,  PSYCHIATRIC DISORDERS Anxiety Depression  Neuromuscular disease CVA    GI/Hepatic GERD  ,  Endo/Other  diabetes, Type 2  Renal/GU      Musculoskeletal  (+) Arthritis , Fibromyalgia -  Abdominal   Peds  Hematology   Anesthesia Other  Findings   Reproductive/Obstetrics                            Anesthesia Physical Anesthesia Plan  ASA: 3  Anesthesia Plan: General   Post-op Pain Management:    Induction: Intravenous  PONV Risk Score and Plan: 2 and Ondansetron, Dexamethasone and Treatment may vary due to age or medical condition  Airway Management Planned: Oral ETT  Additional Equipment:   Intra-op Plan:   Post-operative Plan: Extubation in OR  Informed Consent: I have reviewed the patients History and Physical, chart, labs and discussed the procedure including the risks, benefits and alternatives for the proposed anesthesia with the patient or authorized representative who has indicated his/her understanding and acceptance.     Dental advisory given  Plan Discussed with: CRNA, Anesthesiologist and Surgeon  Anesthesia Plan Comments: (See PAT note written 09/15/2021 by Myra Gianotti, PA-C. )       Anesthesia Quick Evaluation

## 2021-09-15 NOTE — Progress Notes (Signed)
PERIOPERATIVE PRESCRIPTION FOR IMPLANTED CARDIAC DEVICE PROGRAMMING  Patient Information: Name:  Diane [name]  DOB:  March 20, 1956  MRN:  [id number]     Device Information:  Planned Procedure: Video Bronchoscopy  Surgeon:  Lamonte Sakai  Date of Procedure:  09/18/21  Cautery will be used.  Position during surgery:  ? prone   Please send documentation back to:  [facility] (Fax # [phone])   Sheela Stack, RN  09/15/2021 2:08 PM Clinic EP Physician:  Dr. Quentin Ore   Device Type:  Defibrillator Manufacturer and Phone #:  [employer]: [phone] Pacemaker Dependent?:  No. Date of Last Device Check:  07/25/21  Normal Device Function?:  Yes.    Electrophysiologist's Recommendations:  Have magnet available. Provide continuous ECG monitoring when magnet is used or reprogramming is to be performed.  Procedure may interfere with device function.  Magnet should be placed over device during procedure.  Per Device Clinic Standing Orders, Wanda Plump, RN  4:44 PM 09/15/2021

## 2021-09-15 NOTE — Progress Notes (Signed)
Anesthesia Chart Review: SAME DAY WORK-UP   Case: [id number] Date/Time: 09/18/21 1300   Procedures:      ROBOTIC ASSISTED NAVIGATIONAL BRONCHOSCOPY     VIDEO BRONCHOSCOPY WITH ENDOBRONCHIAL ULTRASOUND   Anesthesia type: General   Pre-op diagnosis: RIGHT UPPER LOBE NODULE,HILAR ADENOPATHY   Location: [place] [facility]   Surgeons: Collene Gobble, MD       DISCUSSION: Patient is a 65 year old female scheduled for the above procedure.  Patient was referred to Dr. West Carbo by her primary pulmonologist Dr. Halford Chessman after high resolution chest CT showed multiple new pulmonary nodules associated with bulky right hilar and right paratracheal lymphadenopathy concerning for bronchogenic malignancy, new left adrenal nodule concerning for potential metastatic lesion, known findings suggestive of Langerhans' cell histiocytosis and emphysema, coronary and aortic atherosclerosis.  Above procedure recommended for tissue diagnosis to guide management.  History includes smoking, COPD (nocturnal O2), HTN, HLD, DM2, CAD (inferior STEMI, CABG 12/04/05: LIMA-LAD, left RA-RI, SVG-OM2, SVG-acute marginal-PDA; s/p mLCX/pOM1 DES 08/08/06; patent LIMA-LAD o/w known occluded grafts since 08/06/06 and occluded DES LCX/OM, not a redo-CABG candidate and no good targets for PCI, medical therapy 05/06/15), murmur (mild AS, trivial MR 01/2021 echo), chronic systolic CHF, ischemic cardiomyopathy, ICD [employer] 07/15/20), dyspnea (depending on volume status), GERD, fibromyalgia, OSA (intolerant to CPAP), CVA (1993, 03/2013), carotid artery stenosis (s/p right carotid stent 10/10/17), PAD (s/p right SFA atherectomy, angioplasty 04/18/20; thyroid goiter, seizure (last 03/2013), ovarian cystadenofibroma (s/p BSO, omentectomy sigmoid colectomy 05/19/15).  Last HF follow-up with Dr. Aundra Dubin on 06/21/2021. Still smoking. No recent chest pain. Medication titration for CHF limited by hypotension and orthostatic symptoms.  Not a  transplant candidate due to smoking, does not qualify for the barostimulation trials due to carotid disease, and with higher EF (up to 45-50% 01/2021) not a candidate for LVAD.  Activity more limited due to claudication than dyspnea and was not volume overloaded by exam or by [facility] at that time.  82-monthfollow-up planned. Next visit 09/20/21.  Cardiac Device Perioperative Recommendations:  Device Type:  Defibrillator Manufacturer and Phone #:  [employer]: [phone] Dependent?:  No. Date of Last Device Check:  07/25/21         Normal Device Function?:  Yes.     Electrophysiologist's Recommendations: Have magnet available. Provide continuous ECG monitoring when magnet is used or reprogramming is to be performed.  Procedure may interfere with device function.  Magnet should be placed over device during procedure.   Complex medical history as outlined. She appears up-to-date with her cardiology follow-up. No recent chest pain and stable volume status at her last HF visit 3 months ago. Last EF 01/2021 45-50%. Known occluded bypass grafts expect patent LIMA-LAD by last cath, with medical therapy recommended as previously mentioned. On ASA and Plavix.  Unfortunately now with pulmonary nodules and tissue diagnosis needed. Per Dr. BLamonte Sakai his staff was to instruct her to hold Plavix for 5 days but to continue ASA.  She will be evaluated by her anesthesia team on the day of surgery.    VS: LMP 09/02/2003  BP Readings from Last 3 Encounters:  08/30/21 (!) 110/50  06/26/21 126/72  06/21/21 (!) 106/50   Pulse Readings from Last 3 Encounters:  08/30/21 84  06/26/21 78  06/21/21 80     PROVIDERS: [place], MWynelle Fanny MD is PCP  - SChesley Mires MD is pulmonologist - MLoralie Champagne MD is HF cardiologist. Last visit 06/21/21. Also evaluated by DMelvyn Novas MD  at [facility] in 2021. - Martinique, Peter, MD is primary cardiologist. Last visit 01/06/21.  1 year follow-up planned. Quay Burow, MD is [place] cardiologist. Last visit 12/06/20.  Lars Mage, MD is EP cardiologist. Last visit 10/31/20.  Device interrogation showed stable lead parameters with good battery longevity.  No ischemic symptoms. Metta Clines, DO is neurologist.  Last visit 03/10/2021 for follow-up seizure disorder and cervical radiculopathy. - Maryan Rued, MD is endocrinologist   LABS: For updated labs on arriva. As of 06/19/21, Cr 0.80, CBC and LFTs normal, TSH 2.23. A1c8.6% 02/24/20.    PFTs 03/09/20: FVC-Pre L 2.47  FVC-%Pred-Pre % 109  FEV1-Pre L 1.87  FEV1-%Pred-Pre % 108  FEV6-Pre L 2.35  FEV6-%Pred-Pre % 108  Pre FEV1/FVC ratio % 75  FEV1FVC-%Pred-Pre % 98  Pre FEV6/FVC Ratio % 100  FEV6FVC-%Pred-Pre % 104  FEF 25-75 Pre L/sec 1.70  FEF2575-%Pred-Pre % 99  RV L 1.68  RV % pred % 99  TLC L 4.15  TLC % pred % 106  DLCO unc ml/min/mmHg 9.97  DLCO unc % pred % 66  DL/VA ml/min/mmHg/L 2.77  DL/VA % pred % 62    IMAGES: CT Chest High Resolution 09/08/21: IMPRESSION: 1. Again noted is a spectrum of findings suggestive of a smoking related disease such as Langerhans cell histiocytosis. However, today's study also demonstrates the presence of multiple new pulmonary nodules in the right upper lobe, largest of which measures 1.5 x 1.1 cm, associated with bulky right hilar and right paratracheal lymphadenopathy. Findings are highly concerning for primary bronchogenic malignancy. Further evaluation with nonemergent PET-CT is strongly recommended in the near future to better evaluate these findings and assess for distal metastatic disease. 2. New left adrenal nodule measuring 2.0 x 1.9 cm, concerning for potential metastatic lesion. 3. Mild centrilobular and paraseptal emphysema. 4. Aortic atherosclerosis, in addition to left main and 3 vessel coronary artery disease. Status post median sternotomy for CABG including LIMA to the LAD. 5. There are calcifications of the aortic valve.  Echocardiographic correlation for evaluation of potential valvular dysfunction may be warranted if clinically indicated. 6. Additional incidental findings, as above.   EKG: 06/21/21: Normal sinus rhythm Minimal voltage criteria for LVH, may be normal variant ( Cornell product ) Inferior infarct , age undetermined Cannot rule out Anterior infarct , age undetermined ST & T wave abnormality, consider lateral ischemia Abnormal ECG No significant change since last tracing Confirmed by Carlyle Dolly [phone]) on 06/22/2021 4:08:22 PM   CV: US Carotid 03/08/21: Summary:  Right Carotid: The ECA appears >50% stenosed. Stable ICA in-stent  stenosis, <                 50%.  Left Carotid: Velocities in the left ICA are consistent with a high end  range                40-59% stenosis. The end diastolic ICA velocities have  decreased                since prior exam. The ECA appears >50% stenosed.  Vertebrals:  Bilateral vertebral arteries demonstrate antegrade flow.  Subclavians: Normal flow hemodynamics were seen in bilateral subclavian               arteries.  Suggest follow up study in 12 months.    Echo 02/17/21: IMPRESSIONS   1. Left ventricular ejection fraction, by estimation, is 45 to 50%. The  left ventricle has mildly decreased function. The left ventricle  demonstrates regional wall motion abnormalities, basal to mid  anterolateral and inferolateral hypokinesis. Left  ventricular diastolic parameters are consistent with Grade I diastolic  dysfunction (impaired relaxation).   2. Right ventricular systolic function is normal. The right ventricular  size is normal. There is normal pulmonary artery systolic pressure. The  estimated right ventricular systolic pressure is 26.9 mmHg.   3. The mitral valve is normal in structure. Trivial mitral valve  regurgitation. No evidence of mitral stenosis.   4. The aortic valve is tricuspid. Aortic valve regurgitation is not  visualized. Mild  aortic valve stenosis. Aortic valve mean gradient  measures 10.0 mmHg.   5. Aortic dilatation noted. There is mild dilatation of the ascending  aorta, measuring 40 mm.   6. The inferior vena cava is normal in size with greater than 50%  respiratory variability, suggesting right atrial pressure of 3 mmHg.  (Comparison 11/17/19: LVEF 20-25%, grade II DD, moderately reduced RVSF, RVSP 49.3 mmHg, severely dilated LA/RA, moderate-severe MR, severe TR, mild-moderate AS)   Invasive CPX 08/09/20 ([facility]): Conclusions:  1) The data suggests a maximal exercise test, so maximal cardiorespiratory capacity could be determined.  2) Based on the peak oxygen consumption (VO2) achieved, functional capacity would be consistent with a moderate functional impairment, with peak VO2 12.2 ml/kg/min (62% of predicted normals). Normative peak predicted VO2 values are corrected for age, gender, and weight.  3) There were maximal signs of a circulatory limitation to exercise.  - Invasive hemodynamics are reported separately  4) Aerobic reserve was low-normal suggesting physical deconditioning may be a potential limitation to exercise capacity.  5) Ventilatory reserve limits were not approached at peak exercise. Normal pulmonary physiology at rest. The MVV is normal. Spirometry and MVV performed supine. Exercise oscillatory ventilation criteria was not met.  6) No prior CPET available for comparison.     [place] 08/09/20 ([facility]): Impressions:  At rest, normal filling pressures with reduced Fick CI 1.8 L/min/m2, elevated PVR  Patient exercised for 3 min on supine bicycle and stopped due to claudication, not dyspnea  Peak HR 109 bpm and peak SBP 163 bpm  During exercise, peak RER was variable, approx 1.3 with peak VO2 12.2 ml/kg/min  There was increase in PCWP to 24 mm Hg and mean PAP to 41 mm Hg at peak exercise   Stage Hgb  Ao % PA % AV02 02 cons CO PA mean PCW  PVR  0 13.1 1.36 90 54.4 6.34 218 3._0 4.7  1 12.9  1.36 94 40.4 9.40 400 4.3 40 21 4.5  2 13.1 1.36 95 38.0 10.16 535 5.3 41 24 3.2     EP/ICD Implant 07/15/20: CONCLUSIONS:   1. Ischemic cardiomyopathy with chronic New York Heart Association class 3 heart failure.   2. Successful ICD implantation with a VVI ICD implanted for primary prevention of sudden death.   3.  No early apparent complications.    RHC/LHC 05/06/15: There is severe left ventricular systolic dysfunction. LM lesion, 50% stenosed. Prox LAD lesion, 90% stenosed. Mid LAD lesion, 100% stenosed. 1st Diag lesion, 95% stenosed. Ramus-1 lesion, 75% stenosed. Ramus-2 lesion, 80% stenosed. Mid Cx to Dist Cx lesion, 100% stenosed. Ost Cx to Mid Cx lesion, 99% stenosed. A drug-eluting stent was placed. The lesion was previously treated with a drug-eluting stent greater than two years ago. Ost 1st Mrg to 1st Mrg lesion, 100% stenosed. A drug-eluting stent was placed. The lesion was previously treated with a drug-eluting stent greater  than two years ago. Ost RCA to Prox RCA lesion, 100% stenosed. is normal in caliber, and is anatomically normal. Prox RCA to Mid RCA lesion, 100% stenosed. Dist RCA lesion, 100% stenosed.   1. Severe 3 vessel obstructive CAD.      - 50% distal left main stenosis.    - 100% occluded LAD, 99% first diagonal    - 75% proximal ramus intermediate. The intermediate bifurcates in the mid vessel and there is an 80% stenosis in the more superior branch    - 100% occlusion of the proximal LCx with diffuse occlusion of the stents in the LCx and first OM. There are left to left collaterals to the distal vessels.    - 100% occlusion of the proximal RCA. Faint right to right collaterals to the mid RCA. Left to right collaterals to the distal RCA.   2. Patent LIMA graft to the LAD   3. Known occlusion of all other bypass grafts from cardiac cath in 2007.   4. Severe LV dysfunction   5. Moderate pulmonary HTN   6. Normal LV filling pressures.     Recommendations: Continue medical management. She is not a candidate for redo CABG due to poor targets and prior early graft failure. She does not have suitable lesions for PCI. Will increase carvedilol to 25 mg daily. She appears to be euvolemic by hemodynamics. Would consider proceeding with plans for surgery to treat abdominal mass. Cardiac risk will be high but she is stable and her medications are optimized. She may stop Plavix prior to surgical procedures.    Past Medical History:  Diagnosis Date   Anemia    Carotid stenosis    a. s/p right carotid stent 10/2017.   Chronic systolic CHF (congestive heart failure) (HCC)    Colon polyps    COPD (chronic obstructive pulmonary disease) (HCC)    Coronary artery disease    post CABG in 3/07 , coronary stents    Diabetes mellitus    type 2   Dyslipidemia    Fibromyalgia    GERD (gastroesophageal reflux disease)    Headache    hx of    Hyperlipidemia    Hypertension    Myocardial infarction ([place])    Pneumonia    hx of    Seizures ([place])    last seizure- 03/2013    Shortness of breath dyspnea    with exertion or when fluid builds up    Sleep apnea    used to wear a cpap- not used in 3 years    Status post dilation of esophageal narrowing    Stroke [facility]) 1993   problems with balance    Systolic murmur    known mild AS and MR   Thyroid goiter    Tobacco abuse     Past Surgical History:  Procedure Laterality Date   ABDOMINAL AORTOGRAM W/LOWER EXTREMITY N/A 04/18/2020   Procedure: ABDOMINAL AORTOGRAM W/LOWER EXTREMITY;  Surgeon: Lorretta Harp, MD;  Location: [place] [facility];  Service: Cardiovascular;  Laterality: N/A;   APPENDECTOMY     BIOPSY  02/03/2019   Procedure: BIOPSY;  Surgeon: Ladene Artist, MD;  Location: [facility];  Service: Endoscopy;;   BREAST BIOPSY Left 2016   CARDIAC CATHETERIZATION  11/29/05   EF of 55%   CARDIAC CATHETERIZATION  08/06/06   EF of 45-50%   CARDIAC CATHETERIZATION N/A 05/06/2015    Procedure: Right/Left Heart Cath and Coronary/Graft Angiography;  Surgeon: Peter M Martinique, MD;  Location: [place] [facility];  Service: Cardiovascular;  Laterality: N/A;   CAROTID PTA/STENT INTERVENTION N/A 10/10/2017   Procedure: CAROTID PTA/STENT INTERVENTION - Right;  Surgeon: Serafina Mitchell, MD;  Location: [place] [facility];  Service: Cardiovascular;  Laterality: N/A;   CERVICAL FUSION  1990   CHOLECYSTECTOMY     COLON RESECTION     mass removed and 4 in of colon   COLONOSCOPY WITH PROPOFOL N/A 07/16/2017   Procedure: COLONOSCOPY WITH PROPOFOL;  Surgeon: Ladene Artist, MD;  Location: [facility];  Service: Endoscopy;  Laterality: N/A;   CORONARY ARTERY BYPASS GRAFT  12/04/2005   x5 -- left internal mammary artery to the LAD, left radial artery to the ramus intermedius, saphenous vein graft to the obtuse marginal 1, sequential saphenous vein grat to the acute marginal and posterior descending, endoscopic vein harvesting from the left thigh with open vein harvest from right leg   CORONARY STENT PLACEMENT  08/11/06   PCI of her ciurcumflex/OM vessel   ESOPHAGOGASTRODUODENOSCOPY (EGD) WITH PROPOFOL N/A 02/03/2019   Procedure: ESOPHAGOGASTRODUODENOSCOPY (EGD) WITH PROPOFOL;  Surgeon: Ladene Artist, MD;  [facility];  Service: Endoscopy;  Laterality: N/A;   ICD IMPLANT N/A 07/15/2020   Procedure: ICD IMPLANT;  Surgeon: Vickie Epley, MD;  Location: [place] [facility];  Service: Cardiovascular;  Laterality: N/A;   LAPAROTOMY Bilateral 05/19/2015   Procedure: EXPLORATORY LAPAROTOMY WITH BILATERAL SALPINGO OOPHORECTOMY /OMENTECTOMY/SEGMENTAL SIGMOID COLECTOMY ;  Surgeon: Everitt Amber, MD;  Location: [facility];  Service: Gynecology;  Laterality: Bilateral;   PERIPHERAL VASCULAR BALLOON ANGIOPLASTY  04/18/2020   Procedure: PERIPHERAL VASCULAR BALLOON ANGIOPLASTY;  Surgeon: Lorretta Harp, MD;  Location: [place] [facility];  Service: Cardiovascular;;  rt SFA  atherectomy and DCB    PERIPHERAL VASCULAR CATHETERIZATION N/A 11/15/2015   Procedure: Carotid PTA/Stent Intervention;  Surgeon: Lorretta Harp, MD;  Location: [place] [facility];  Service: Cardiovascular;  Laterality: N/A;   PERIPHERAL VASCULAR CATHETERIZATION  11/15/2015   Procedure: Carotid Angiography;  Surgeon: Lorretta Harp, MD;  Location: [place] [facility];  Service: Cardiovascular;;   RIGHT HEART CATH N/A 01/27/2020   Procedure: RIGHT HEART CATH;  Surgeon: Larey Dresser, MD;  Location: [place] [facility];  Service: Cardiovascular;  Laterality: N/A;    MEDICATIONS: No current facility-administered medications for this encounter.    acetaminophen (TYLENOL) 500 MG tablet   albuterol (PROVENTIL) (2.5 MG/3ML) 0.083% nebulizer solution   albuterol (VENTOLIN HFA) 108 (90 Base) MCG/ACT inhaler   aspirin EC 81 MG tablet   clopidogrel (PLAVIX) 75 MG tablet   dexlansoprazole (DEXILANT) 60 MG capsule   digoxin (LANOXIN) 0.125 MG tablet   diphenhydrAMINE (BENADRYL) 25 mg capsule   ezetimibe (ZETIA) 10 MG tablet   fluticasone (FLONASE) 50 MCG/ACT nasal spray   gabapentin (NEURONTIN) 300 MG capsule   guaiFENesin (MUCINEX) 600 MG 12 hr tablet   HYDROcodone-acetaminophen (NORCO) 5-325 MG tablet   icosapent Ethyl (VASCEPA) 1 g capsule   insulin aspart protamine- aspart (NOVOLOG MIX 70/30) (70-30) 100 UNIT/ML injection   levETIRAcetam (KEPPRA) 1000 MG tablet   metFORMIN (GLUCOPHAGE-XR) 500 MG 24 hr tablet   metolazone (ZAROXOLYN) 2.5 MG tablet   nicotine (NICODERM CQ - DOSED IN MG/24 HOURS) 21 mg/24hr patch   nicotine polacrilex (NICORETTE) 4 MG gum   nitroGLYCERIN (NITROLINGUAL) 0.4 MG/SPRAY spray   nystatin (MYCOSTATIN/NYSTOP) powder   polyethylene glycol (MIRALAX / GLYCOLAX) 17 g packet   potassium chloride SA (KLOR-CON M20) 20 MEQ tablet  promethazine (PHENERGAN) 25 MG tablet   ranolazine (RANEXA) 1000 MG SR tablet   rosuvastatin (CRESTOR) 20 MG tablet   sodium chloride (OCEAN) 0.65 % SOLN nasal spray    spironolactone (ALDACTONE) 25 MG tablet   torsemide (DEMADEX) 20 MG tablet   triamcinolone cream (KENALOG) 0.5 %   Vericiguat 10 MG TABS   glucose blood (ONETOUCH ULTRA) test strip   iron polysaccharides (NIFEREX) 150 MG capsule   ONE TOUCH ULTRA TEST test strip    Myra Gianotti, PA-C Surgical Short Stay/Anesthesiology [facility] Phone [phone] [facility] Phone [phone] 09/15/2021 4:53 PM

## 2021-09-18 ENCOUNTER — Encounter (HOSPITAL_COMMUNITY)
Admission: RE | Disposition: A | Discharge status: Home / Self Care | Payer: Self-pay | Source: Ambulatory Visit | Attending: Emergency Medicine

## 2021-09-18 ENCOUNTER — Ambulatory Visit (HOSPITAL_COMMUNITY): Payer: Medicare Other | Admitting: Vascular Surgery

## 2021-09-18 ENCOUNTER — Ambulatory Visit (HOSPITAL_COMMUNITY): Payer: Medicare Other

## 2021-09-18 ENCOUNTER — Ambulatory Visit (HOSPITAL_COMMUNITY)
Admission: RE | Admit: 2021-09-18 | Discharge: 2021-09-18 | Disposition: A | Discharge status: Home / Self Care | Payer: Medicare Other | Source: Ambulatory Visit | Attending: Emergency Medicine | Admitting: Emergency Medicine

## 2021-09-18 DIAGNOSIS — R519 Headache, unspecified: Secondary | ICD-10-CM | POA: Insufficient documentation

## 2021-09-18 DIAGNOSIS — I110 Hypertensive heart disease with heart failure: Secondary | ICD-10-CM | POA: Diagnosis not present

## 2021-09-18 DIAGNOSIS — G4733 Obstructive sleep apnea (adult) (pediatric): Secondary | ICD-10-CM | POA: Diagnosis not present

## 2021-09-18 DIAGNOSIS — R569 Unspecified convulsions: Secondary | ICD-10-CM | POA: Diagnosis not present

## 2021-09-18 DIAGNOSIS — Z95810 Presence of automatic (implantable) cardiac defibrillator: Secondary | ICD-10-CM | POA: Insufficient documentation

## 2021-09-18 DIAGNOSIS — J9611 Chronic respiratory failure with hypoxia: Secondary | ICD-10-CM | POA: Insufficient documentation

## 2021-09-18 DIAGNOSIS — R590 Localized enlarged lymph nodes: Secondary | ICD-10-CM | POA: Diagnosis not present

## 2021-09-18 DIAGNOSIS — R918 Other nonspecific abnormal finding of lung field: Secondary | ICD-10-CM | POA: Diagnosis not present

## 2021-09-18 DIAGNOSIS — Z8673 Personal history of transient ischemic attack (TIA), and cerebral infarction without residual deficits: Secondary | ICD-10-CM | POA: Diagnosis not present

## 2021-09-18 DIAGNOSIS — D509 Iron deficiency anemia, unspecified: Secondary | ICD-10-CM | POA: Diagnosis not present

## 2021-09-18 DIAGNOSIS — M1990 Unspecified osteoarthritis, unspecified site: Secondary | ICD-10-CM | POA: Diagnosis not present

## 2021-09-18 DIAGNOSIS — F32A Depression, unspecified: Secondary | ICD-10-CM | POA: Diagnosis not present

## 2021-09-18 DIAGNOSIS — I700 Atherosclerosis of aorta: Secondary | ICD-10-CM | POA: Insufficient documentation

## 2021-09-18 DIAGNOSIS — G4734 Idiopathic sleep related nonobstructive alveolar hypoventilation: Secondary | ICD-10-CM | POA: Diagnosis not present

## 2021-09-18 DIAGNOSIS — C969 Malignant neoplasm of lymphoid, hematopoietic and related tissue, unspecified: Secondary | ICD-10-CM | POA: Diagnosis not present

## 2021-09-18 DIAGNOSIS — K219 Gastro-esophageal reflux disease without esophagitis: Secondary | ICD-10-CM | POA: Diagnosis not present

## 2021-09-18 DIAGNOSIS — F419 Anxiety disorder, unspecified: Secondary | ICD-10-CM | POA: Diagnosis not present

## 2021-09-18 DIAGNOSIS — I252 Old myocardial infarction: Secondary | ICD-10-CM | POA: Insufficient documentation

## 2021-09-18 DIAGNOSIS — F17210 Nicotine dependence, cigarettes, uncomplicated: Secondary | ICD-10-CM | POA: Insufficient documentation

## 2021-09-18 DIAGNOSIS — Z950 Presence of cardiac pacemaker: Secondary | ICD-10-CM | POA: Diagnosis not present

## 2021-09-18 DIAGNOSIS — I5022 Chronic systolic (congestive) heart failure: Secondary | ICD-10-CM | POA: Insufficient documentation

## 2021-09-18 DIAGNOSIS — E119 Type 2 diabetes mellitus without complications: Secondary | ICD-10-CM | POA: Insufficient documentation

## 2021-09-18 DIAGNOSIS — Z419 Encounter for procedure for purposes other than remedying health state, unspecified: Secondary | ICD-10-CM

## 2021-09-18 DIAGNOSIS — C771 Secondary and unspecified malignant neoplasm of intrathoracic lymph nodes: Secondary | ICD-10-CM | POA: Insufficient documentation

## 2021-09-18 DIAGNOSIS — Z955 Presence of coronary angioplasty implant and graft: Secondary | ICD-10-CM | POA: Insufficient documentation

## 2021-09-18 DIAGNOSIS — C3411 Malignant neoplasm of upper lobe, right bronchus or lung: Secondary | ICD-10-CM | POA: Diagnosis not present

## 2021-09-18 DIAGNOSIS — I2510 Atherosclerotic heart disease of native coronary artery without angina pectoris: Secondary | ICD-10-CM | POA: Insufficient documentation

## 2021-09-18 DIAGNOSIS — Z9889 Other specified postprocedural states: Secondary | ICD-10-CM

## 2021-09-18 DIAGNOSIS — I255 Ischemic cardiomyopathy: Secondary | ICD-10-CM | POA: Insufficient documentation

## 2021-09-18 DIAGNOSIS — M797 Fibromyalgia: Secondary | ICD-10-CM | POA: Diagnosis not present

## 2021-09-18 DIAGNOSIS — G709 Myoneural disorder, unspecified: Secondary | ICD-10-CM | POA: Insufficient documentation

## 2021-09-18 DIAGNOSIS — R911 Solitary pulmonary nodule: Secondary | ICD-10-CM | POA: Diagnosis not present

## 2021-09-18 DIAGNOSIS — G40009 Localization-related (focal) (partial) idiopathic epilepsy and epileptic syndromes with seizures of localized onset, not intractable, without status epilepticus: Secondary | ICD-10-CM

## 2021-09-18 DIAGNOSIS — J432 Centrilobular emphysema: Secondary | ICD-10-CM | POA: Insufficient documentation

## 2021-09-18 DIAGNOSIS — E782 Mixed hyperlipidemia: Secondary | ICD-10-CM

## 2021-09-18 DIAGNOSIS — Z951 Presence of aortocoronary bypass graft: Secondary | ICD-10-CM | POA: Insufficient documentation

## 2021-09-18 DIAGNOSIS — I272 Pulmonary hypertension, unspecified: Secondary | ICD-10-CM | POA: Diagnosis not present

## 2021-09-18 HISTORY — PX: FINE NEEDLE ASPIRATION: SHX5430

## 2021-09-18 HISTORY — DX: Unspecified osteoarthritis, unspecified site: M19.90

## 2021-09-18 HISTORY — DX: Depression, unspecified: F32.A

## 2021-09-18 HISTORY — DX: Fatty (change of) liver, not elsewhere classified: K76.0

## 2021-09-18 HISTORY — DX: Personal history of urinary calculi: Z87.442

## 2021-09-18 HISTORY — PX: VIDEO BRONCHOSCOPY WITH RADIAL ENDOBRONCHIAL ULTRASOUND: SHX6849

## 2021-09-18 HISTORY — PX: VIDEO BRONCHOSCOPY WITH ENDOBRONCHIAL ULTRASOUND: SHX6177

## 2021-09-18 HISTORY — PX: BRONCHIAL BIOPSY: SHX5109

## 2021-09-18 HISTORY — PX: BRONCHIAL NEEDLE ASPIRATION BIOPSY: SHX5106

## 2021-09-18 HISTORY — PX: BRONCHIAL BRUSHINGS: SHX5108

## 2021-09-18 LAB — COMPREHENSIVE METABOLIC PANEL
ALT: 13 U/L (ref 0–44)
AST: 20 U/L (ref 15–41)
Albumin: 3.3 g/dL — ABNORMAL LOW (ref 3.5–5.0)
Alkaline Phosphatase: 59 U/L (ref 38–126)
Anion gap: 11 (ref 5–15)
BUN: 19 mg/dL (ref 8–23)
CO2: 25 mmol/L (ref 22–32)
Calcium: 8.9 mg/dL (ref 8.9–10.3)
Chloride: 93 mmol/L — ABNORMAL LOW (ref 98–111)
Creatinine, Ser: 0.77 mg/dL (ref 0.44–1.00)
GFR, Estimated: >60 mL/min (ref >60)
Glucose, Bld: 200 mg/dL — ABNORMAL HIGH (ref 70–99)
Potassium: 3.6 mmol/L (ref 3.5–5.1)
Sodium: 129 mmol/L — ABNORMAL LOW (ref 135–145)
Total Bilirubin: 0.7 mg/dL (ref 0.3–1.2)
Total Protein: 6.2 g/dL — ABNORMAL LOW (ref 6.5–8.1)

## 2021-09-18 LAB — GLUCOSE, CAPILLARY: Glucose-Capillary: 202 mg/dL — ABNORMAL HIGH (ref 70–99)

## 2021-09-18 LAB — CBC
HCT: 33.6 % — ABNORMAL LOW (ref 36.0–46.0)
Hemoglobin: 10.2 g/dL — ABNORMAL LOW (ref 12.0–15.0)
MCH: 24.8 pg — ABNORMAL LOW (ref 26.0–34.0)
MCHC: 30.4 g/dL (ref 30.0–36.0)
MCV: 81.6 fL (ref 80.0–100.0)
Platelets: 278 10*3/uL (ref 150–400)
RBC: 4.12 MIL/uL (ref 3.87–5.11)
RDW: 15.5 % (ref 11.5–15.5)
WBC: 9.8 10*3/uL (ref 4.0–10.5)
nRBC: 0 % (ref 0.0–0.2)

## 2021-09-18 LAB — APTT: aPTT: 27 seconds (ref 24–36)

## 2021-09-18 LAB — PROTIME-INR
INR: 1 (ref 0.8–1.2)
Prothrombin Time: 13 seconds (ref 11.4–15.2)

## 2021-09-18 SURGERY — BRONCHOSCOPY, WITH BIOPSY USING ELECTROMAGNETIC NAVIGATION
Anesthesia: General

## 2021-09-18 MED ORDER — OXYCODONE HCL 5 MG/5ML PO SOLN: 5.0000 mg | Freq: Once | ORAL | Status: DC | PRN

## 2021-09-18 MED ORDER — FENTANYL CITRATE (PF) 100 MCG/2ML IJ SOLN
INTRAMUSCULAR | Status: DC | PRN
  Administered 2021-09-18 (×2): 50 ug via INTRAVENOUS

## 2021-09-18 MED ORDER — GABAPENTIN 300 MG PO CAPS: 600.0000 mg | ORAL_CAPSULE | Freq: Every day | ORAL | Status: DC

## 2021-09-18 MED ORDER — MIDAZOLAM HCL 2 MG/2ML IJ SOLN
INTRAMUSCULAR | Status: DC | PRN
  Administered 2021-09-18 (×2): 1 mg via INTRAVENOUS

## 2021-09-18 MED ORDER — ICOSAPENT ETHYL 1 G PO CAPS: 2.0000 g | ORAL_CAPSULE | Freq: Two times a day (BID) | ORAL | Status: DC

## 2021-09-18 MED ORDER — POTASSIUM CHLORIDE CRYS ER 20 MEQ PO TBCR: 20.0000 meq | EXTENDED_RELEASE_TABLET | Freq: Two times a day (BID) | ORAL | Status: DC

## 2021-09-18 MED ORDER — ROCURONIUM BROMIDE 10 MG/ML (PF) SYRINGE
PREFILLED_SYRINGE | INTRAVENOUS | Status: DC | PRN
  Administered 2021-09-18: 50 mg via INTRAVENOUS
  Administered 2021-09-18: 20 mg via INTRAVENOUS

## 2021-09-18 MED ORDER — LEVETIRACETAM 1000 MG PO TABS: 1000.0000 mg | ORAL_TABLET | Freq: Two times a day (BID) | ORAL | Status: DC

## 2021-09-18 MED ORDER — GUAIFENESIN ER 600 MG PO TB12: 1200.0000 mg | ORAL_TABLET | Freq: Two times a day (BID) | ORAL | 3 refills | Status: AC

## 2021-09-18 MED ORDER — SPIRONOLACTONE 25 MG PO TABS: 25.0000 mg | ORAL_TABLET | Freq: Every day | ORAL | Status: DC

## 2021-09-18 MED ORDER — VERICIGUAT 10 MG PO TABS: 10.0000 mg | ORAL_TABLET | Freq: Every day | ORAL | Status: DC

## 2021-09-18 MED ORDER — PHENYLEPHRINE 40 MCG/ML (10ML) SYRINGE FOR IV PUSH (FOR BLOOD PRESSURE SUPPORT)
PREFILLED_SYRINGE | INTRAVENOUS | Status: DC | PRN
  Administered 2021-09-18 (×2): 80 ug via INTRAVENOUS

## 2021-09-18 MED ORDER — PROPOFOL 10 MG/ML IV BOLUS
INTRAVENOUS | Status: DC | PRN
  Administered 2021-09-18: 150 mg via INTRAVENOUS
  Administered 2021-09-18: 50 mg via INTRAVENOUS

## 2021-09-18 MED ORDER — LACTATED RINGERS IV SOLN: INTRAVENOUS | Status: DC

## 2021-09-18 MED ORDER — CHLORHEXIDINE GLUCONATE 0.12 % MT SOLN
15.0000 mL | Freq: Once | OROMUCOSAL | Status: DC
  Filled 2021-09-18: qty 15

## 2021-09-18 MED ORDER — DEXAMETHASONE SODIUM PHOSPHATE 10 MG/ML IJ SOLN
INTRAMUSCULAR | Status: DC | PRN
  Administered 2021-09-18: 4 mg via INTRAVENOUS

## 2021-09-18 MED ORDER — OXYCODONE HCL 5 MG PO TABS: 5.0000 mg | ORAL_TABLET | Freq: Once | ORAL | Status: DC | PRN

## 2021-09-18 MED ORDER — LIDOCAINE 2% (20 MG/ML) 5 ML SYRINGE
INTRAMUSCULAR | Status: DC | PRN
  Administered 2021-09-18: 60 mg via INTRAVENOUS

## 2021-09-18 MED ORDER — SUGAMMADEX SODIUM 200 MG/2ML IV SOLN
INTRAVENOUS | Status: DC | PRN
  Administered 2021-09-18: 200 mg via INTRAVENOUS

## 2021-09-18 MED ORDER — DEXLANSOPRAZOLE 60 MG PO CPDR: 60.0000 mg | DELAYED_RELEASE_CAPSULE | Freq: Every evening | ORAL | Status: DC

## 2021-09-18 MED ORDER — FENTANYL CITRATE (PF) 100 MCG/2ML IJ SOLN: 25.0000 ug | INTRAMUSCULAR | Status: DC | PRN

## 2021-09-18 MED ORDER — ONDANSETRON HCL 4 MG/2ML IJ SOLN
INTRAMUSCULAR | Status: DC | PRN
  Administered 2021-09-18: 4 mg via INTRAVENOUS

## 2021-09-18 MED ORDER — ROSUVASTATIN CALCIUM 20 MG PO TABS: 20.0000 mg | ORAL_TABLET | Freq: Every day | ORAL | Status: DC

## 2021-09-18 MED ORDER — TRIAMCINOLONE ACETONIDE 0.5 % EX CREA: 1.0000 | TOPICAL_CREAM | Freq: Three times a day (TID) | CUTANEOUS | Status: DC | PRN

## 2021-09-18 MED ORDER — ONDANSETRON HCL 4 MG/2ML IJ SOLN: 4.0000 mg | Freq: Four times a day (QID) | INTRAMUSCULAR | Status: DC | PRN

## 2021-09-18 NOTE — Op Note (Signed)
Video Bronchoscopy with Robotic Assisted Bronchoscopic Navigation and Endobronchial Ultrasound Procedure Note  Date of Operation: 09/18/2021   Pre-op Diagnosis: Right upper lobe nodules, right hilar adenopathy  Post-op Diagnosis: Same  Surgeon: Baltazar Apo  Assistants: None  Anesthesia: General endotracheal anesthesia  Operation: Flexible video fiberoptic bronchoscopy with robotic assistance and biopsies.  Estimated Blood Loss: Minimal  Complications: None  Indications and History: Diane [name] is a 65 y.o. woman with history of tobacco use.  She is followed by Dr. Halford Chessman in our office for COPD and Langerhans' cell histiocytosis.  She was found to have a cluster of right upper lobe pulmonary nodules and right hilar adenopathy on a high-resolution CT scan of the chest that was done 09/08/2021.  Recommendation was made to achieve tissue diagnosis via robotic assisted navigational bronchoscopy and endobronchial ultrasound with biopsies.  The risks, benefits, complications, treatment options and expected outcomes were discussed with the patient.  The possibilities of pneumothorax, pneumonia, reaction to medication, pulmonary aspiration, perforation of a viscus, bleeding, failure to diagnose a condition and creating a complication requiring transfusion or operation were discussed with the patient who freely signed the consent.    Description of Procedure: The patient was seen in the Preoperative Area, was examined and was deemed appropriate to proceed.  The patient was taken to [facility] endoscopy room 3, identified as DAWNITA MOLNER and the procedure verified as Flexible Video Fiberoptic Bronchoscopy.  A Time Out was held and the above information confirmed.   Prior to the date of the procedure a high-resolution CT scan of the chest was performed. Utilizing ION software program a virtual tracheobronchial tree was generated to allow the creation of distinct navigation pathways to the patient's  parenchymal abnormalities. After being taken to the operating room general anesthesia was initiated and the patient  was orally intubated. The video fiberoptic bronchoscope was introduced via the endotracheal tube and a general inspection was performed which showed normal right and left lung anatomy, aspiration of the bilateral mainstems was completed to remove any remaining secretions. Robotic catheter inserted into patient's endotracheal tube.   Three distinct right upper lobe pulmonary nodules were clustered, targeted for possible navigation.  Target 1 (the most medial) was not accessible.  Targets 2 and 3 were accessible as below.  Target #3 right upper lobe pulmonary nodule: The distinct navigation pathways prepared prior to this procedure were then utilized to navigate to patient's lesion identified on CT scan. The robotic catheter was secured into place and the vision probe was withdrawn.  Lesion location was approximated using fluoroscopy and radial endobronchial ultrasound for peripheral targeting. Under fluoroscopic guidance transbronchial needle brushings, transbronchial needle biopsies, and transbronchial forceps biopsies were performed to be sent for cytology and pathology.   Target #2 right upper lobe pulmonary nodule (most lateral nodule): The distinct navigation pathways prepared prior to this procedure were then utilized to navigate to patient's lesion identified on CT scan. The robotic catheter was secured into place and the vision probe was withdrawn.  Lesion location was approximated using fluoroscopy and radial endobronchial ultrasound for peripheral targeting.  Local registration and targeting was performed using [employer] three-dimensional imaging.  Under fluoroscopic guidance transbronchial needle brushings, transbronchial needle biopsies, and transbronchial forceps biopsies were performed to be sent for cytology and pathology.   The robotic scope was then withdrawn and the endobronchial  ultrasound was used to identify and characterize the peritracheal, hilar and bronchial lymph nodes. Inspection showed significant enlargement of all the 10 R nodes.  No left-sided nodes were found, no subcarinal nodes identified. Using real-time ultrasound guidance Wang needle biopsies were take from Station 10 R nodes and were sent for cytology.   At the end of the procedure a general airway inspection was performed and there was no evidence of active bleeding. The bronchoscope was removed.  The patient tolerated the procedure well. There was no significant blood loss and there were no obvious complications. A post-procedural chest x-ray is pending.  Samples Target #3: 1. Transbronchial needle brushings from upper lobe nodule 2. Transbronchial Wang needle biopsies from right upper lobe nodule 3. Transbronchial forceps biopsies from right upper lobe nodule  Samples Target #2: 1. Transbronchial needle brushings from right upper lobe nodule (most lateral) 2. Transbronchial Wang needle biopsies from right upper lobe nodule (most lateral) 3. Transbronchial forceps biopsies from right upper lobe nodule (most lateral  EBUS Samples: 1. Wang needle biopsies from 10R node   Plans:  The patient will be discharged from the PACU to home when recovered from anesthesia and after chest x-ray is reviewed. We will review the cytology, pathology and microbiology results with the patient when they become available. Outpatient followup will be with Dr Halford Chessman.    Baltazar Apo, MD, PhD 09/18/2021, 2:57 PM [facility] [phone] or if no answer before 7:00PM call [phone] For any issues after 7:00PM please call eLink [phone]

## 2021-09-18 NOTE — Anesthesia Procedure Notes (Signed)
Procedure Name: Intubation Date/Time: 09/18/2021 1:25 PM Performed by: Genelle Bal, CRNA Pre-anesthesia Checklist: Patient identified, Emergency Drugs available, Suction available and Patient being monitored Patient Re-evaluated:Patient Re-evaluated prior to induction Oxygen Delivery Method: Circle system utilized Preoxygenation: Pre-oxygenation with 100% oxygen Induction Type: IV induction Ventilation: Mask ventilation without difficulty and Oral airway inserted - appropriate to patient size Laryngoscope Size: Sabra Heck and 2 Grade View: Grade I Tube type: Oral Tube size: 8.5 mm Number of attempts: 1 Airway Equipment and Method: Stylet and Oral airway Placement Confirmation: ETT inserted through vocal cords under direct vision, positive ETCO2 and breath sounds checked- equal and bilateral Secured at: 22 cm Tube secured with: Tape Dental Injury: Teeth and Oropharynx as per pre-operative assessment

## 2021-09-18 NOTE — Anesthesia Postprocedure Evaluation (Signed)
Anesthesia Post Note  Patient: Carlyon Nolasco Bruns  Procedure(s) Performed: ROBOTIC ASSISTED NAVIGATIONAL BRONCHOSCOPY VIDEO BRONCHOSCOPY WITH ENDOBRONCHIAL ULTRASOUND RADIAL ENDOBRONCHIAL ULTRASOUND BRONCHIAL BIOPSIES BRONCHIAL BRUSHINGS BRONCHIAL NEEDLE ASPIRATION BIOPSIES FINE NEEDLE ASPIRATION (FNA) LINEAR     Patient location during evaluation: PACU Anesthesia Type: General Level of consciousness: awake and alert Pain management: pain level controlled Vital Signs Assessment: post-procedure vital signs reviewed and stable Respiratory status: spontaneous breathing, nonlabored ventilation, respiratory function stable and patient connected to nasal cannula oxygen Cardiovascular status: blood pressure returned to baseline and stable Postop Assessment: no apparent nausea or vomiting Anesthetic complications: no   No notable events documented.  Last Vitals:  Vitals:   09/18/21 1530 09/18/21 1540  BP: (!) 133/36 (!) 121/40  Pulse: 98 93  Resp: 15 18  Temp:    SpO2: 93% 92%    Last Pain:  Vitals:   09/18/21 1502  TempSrc: Temporal  PainSc: 0-No pain                 Audry Pili

## 2021-09-18 NOTE — Progress Notes (Signed)
[employer] to be sure the representative was aware of procedure.  Orders for device in chart.

## 2021-09-18 NOTE — Progress Notes (Signed)
Per [employer] with [employer]  If bovi or cautery used in procedure for more than 3 seconds at a time place magnet over device. Once magnet is removed device normal function returns in less than 1 second.   Pager number if needed [phone]

## 2021-09-18 NOTE — Transfer of Care (Signed)
Immediate Anesthesia Transfer of Care Note  Patient: Diane [name]  Procedure(s) Performed: ROBOTIC ASSISTED NAVIGATIONAL BRONCHOSCOPY VIDEO BRONCHOSCOPY WITH ENDOBRONCHIAL ULTRASOUND RADIAL ENDOBRONCHIAL ULTRASOUND BRONCHIAL BIOPSIES BRONCHIAL BRUSHINGS BRONCHIAL NEEDLE ASPIRATION BIOPSIES FINE NEEDLE ASPIRATION (FNA) LINEAR  Patient Location: Endoscopy Unit  Anesthesia Type:General  Level of Consciousness: awake, alert  and oriented  Airway & Oxygen Therapy: Patient Spontanous Breathing and Patient connected to face mask oxygen  Post-op Assessment: Report given to RN and Post -op Vital signs reviewed and stable  Post vital signs: Reviewed and stable  Last Vitals:  Vitals Value Taken Time  BP 164/46 09/18/21 1502  Temp    Pulse 105 09/18/21 1502  Resp 23 09/18/21 1502  SpO2 95 % 09/18/21 1502    Last Pain:  Vitals:   09/18/21 1502  TempSrc: Temporal  PainSc: 0-No pain         Complications: No notable events documented.

## 2021-09-18 NOTE — Interval H&P Note (Signed)
History and Physical Interval Note:  09/18/2021 11:59 AM  Diane [name]  has presented today for surgery, with the diagnosis of RIGHT UPPER LOBE NODULE,HILAR ADENOPATHY.  The various methods of treatment have been discussed with the patient and family. After consideration of risks, benefits and other options for treatment, the patient has consented to  Procedure(s): ROBOTIC ASSISTED NAVIGATIONAL BRONCHOSCOPY (N/A) VIDEO BRONCHOSCOPY WITH ENDOBRONCHIAL ULTRASOUND (N/A) as a surgical intervention.  The patient's history has been reviewed, patient examined, no change in status, stable for surgery.  I have reviewed the patient's chart and labs.  Questions were answered to the patient's satisfaction.     Collene Gobble

## 2021-09-18 NOTE — Discharge Instructions (Signed)
Flexible Bronchoscopy, Care After This sheet gives you information about how to care for yourself after your test. Your doctor may also give you more specific instructions. If you have problems or questions, contact your doctor. Follow these instructions at home: Eating and drinking Do not eat or drink anything (not even water) for 2 hours after your test, or until your numbing medicine (local anesthetic) wears off. When your numbness is gone and your cough and gag reflexes have come back, you may: Eat only soft foods. Slowly drink liquids. The day after the test, go back to your normal diet. Driving Do not drive for 24 hours if you were given a medicine to help you relax (sedative). Do not drive or use heavy machinery while taking prescription pain medicine. General instructions  Take over-the-counter and prescription medicines only as told by your doctor. Return to your normal activities as told. Ask what activities are safe for you. Do not use any products that have nicotine or tobacco in them. This includes cigarettes and e-cigarettes. If you need help quitting, ask your doctor. Keep all follow-up visits as told by your doctor. This is important. It is very important if you had a tissue sample (biopsy) taken. Get help right away if: You have shortness of breath that gets worse. You get light-headed. You feel like you are going to pass out (faint). You have chest pain. You cough up: More than a little blood. More blood than before. Summary Do not eat or drink anything (not even water) for 2 hours after your test, or until your numbing medicine wears off. Do not use cigarettes. Do not use e-cigarettes. Get help right away if you have chest pain.  Please call our office for any questions or concerns.  [phone].  This information is not intended to replace advice given to you by your health care provider. Make sure you discuss any questions you have with your health care  provider. Document Released: 07/15/2009 Document Revised: 08/30/2017 Document Reviewed: 10/05/2016 Elsevier Patient Education  2020 [employer].

## 2021-09-20 ENCOUNTER — Ambulatory Visit (HOSPITAL_COMMUNITY)
Admission: RE | Admit: 2021-09-20 | Discharge: 2021-09-20 | Disposition: A | Discharge status: Home / Self Care | Payer: Medicare Other | Source: Ambulatory Visit | Attending: Family Medicine | Admitting: Family Medicine

## 2021-09-20 ENCOUNTER — Other Ambulatory Visit: Payer: Self-pay

## 2021-09-20 ENCOUNTER — Encounter (HOSPITAL_COMMUNITY): Payer: Self-pay

## 2021-09-20 VITALS — BP 110/70 | HR 93 | Wt 131.2 lb

## 2021-09-20 DIAGNOSIS — Z7982 Long term (current) use of aspirin: Secondary | ICD-10-CM | POA: Diagnosis not present

## 2021-09-20 DIAGNOSIS — D509 Iron deficiency anemia, unspecified: Secondary | ICD-10-CM | POA: Diagnosis not present

## 2021-09-20 DIAGNOSIS — I5022 Chronic systolic (congestive) heart failure: Secondary | ICD-10-CM | POA: Diagnosis not present

## 2021-09-20 DIAGNOSIS — F17210 Nicotine dependence, cigarettes, uncomplicated: Secondary | ICD-10-CM | POA: Diagnosis not present

## 2021-09-20 DIAGNOSIS — I110 Hypertensive heart disease with heart failure: Secondary | ICD-10-CM | POA: Insufficient documentation

## 2021-09-20 DIAGNOSIS — I255 Ischemic cardiomyopathy: Secondary | ICD-10-CM | POA: Insufficient documentation

## 2021-09-20 DIAGNOSIS — E782 Mixed hyperlipidemia: Secondary | ICD-10-CM | POA: Diagnosis not present

## 2021-09-20 DIAGNOSIS — I6523 Occlusion and stenosis of bilateral carotid arteries: Secondary | ICD-10-CM

## 2021-09-20 DIAGNOSIS — I6529 Occlusion and stenosis of unspecified carotid artery: Secondary | ICD-10-CM | POA: Insufficient documentation

## 2021-09-20 DIAGNOSIS — I25708 Atherosclerosis of coronary artery bypass graft(s), unspecified, with other forms of angina pectoris: Secondary | ICD-10-CM | POA: Diagnosis not present

## 2021-09-20 DIAGNOSIS — I739 Peripheral vascular disease, unspecified: Secondary | ICD-10-CM

## 2021-09-20 DIAGNOSIS — Z95810 Presence of automatic (implantable) cardiac defibrillator: Secondary | ICD-10-CM | POA: Diagnosis not present

## 2021-09-20 DIAGNOSIS — F17200 Nicotine dependence, unspecified, uncomplicated: Secondary | ICD-10-CM

## 2021-09-20 DIAGNOSIS — Z955 Presence of coronary angioplasty implant and graft: Secondary | ICD-10-CM | POA: Insufficient documentation

## 2021-09-20 DIAGNOSIS — I2510 Atherosclerotic heart disease of native coronary artery without angina pectoris: Secondary | ICD-10-CM | POA: Insufficient documentation

## 2021-09-20 DIAGNOSIS — E1151 Type 2 diabetes mellitus with diabetic peripheral angiopathy without gangrene: Secondary | ICD-10-CM | POA: Diagnosis not present

## 2021-09-20 DIAGNOSIS — R911 Solitary pulmonary nodule: Secondary | ICD-10-CM | POA: Insufficient documentation

## 2021-09-20 DIAGNOSIS — Z79899 Other long term (current) drug therapy: Secondary | ICD-10-CM | POA: Insufficient documentation

## 2021-09-20 DIAGNOSIS — Z8673 Personal history of transient ischemic attack (TIA), and cerebral infarction without residual deficits: Secondary | ICD-10-CM | POA: Diagnosis not present

## 2021-09-20 DIAGNOSIS — Z7902 Long term (current) use of antithrombotics/antiplatelets: Secondary | ICD-10-CM | POA: Insufficient documentation

## 2021-09-20 DIAGNOSIS — Z951 Presence of aortocoronary bypass graft: Secondary | ICD-10-CM | POA: Diagnosis not present

## 2021-09-20 MED ORDER — POTASSIUM CHLORIDE CRYS ER 20 MEQ PO TBCR: EXTENDED_RELEASE_TABLET | ORAL | 3 refills | Status: DC

## 2021-09-20 MED ORDER — TORSEMIDE 20 MG PO TABS: 60.0000 mg | ORAL_TABLET | Freq: Two times a day (BID) | ORAL | 3 refills | Status: DC

## 2021-09-20 MED ORDER — ICOSAPENT ETHYL 1 G PO CAPS: 2.0000 g | ORAL_CAPSULE | Freq: Two times a day (BID) | ORAL | 3 refills | Status: DC

## 2021-09-20 NOTE — Progress Notes (Signed)
PCP: [name], MD Cardiology: Dr. Martinique HF Cardiology: Dr. Aundra Dubin  65 y.o. with history of CAD, ischemic cardiomyopathy, CVA, and valvular disease was referred by Dr. Martinique for CHF evaluation.  Patient has an extensive history of vascular disease.  She had CABG in 2007.  Repeat LHC in 2007 showed all grafts closed except LIMA-LAD.  Patient had DES to LCx/OM.  [place] 2016 showed occlusion of DES to LCx/OM with L-L and L-R collaterals. Poor targets for redo CABG.   In 2/21, echo was done showing EF 20-25% with moderate RV dysfunction, moderate-severe MR, moderate TR.  Despite smoking history, PFTs in 2019 looked relatively normal. She additionally had the placement of a carotid stent on the right in 1/19.    RHC was done in 4/21, showing near-normal filling pressures with CI 2.12 by Fick.  CPX in 5/21 showed peak VO2 13, VE/VCO2 slope 37 => moderate HF limitation.  The PFTs on the CPX were not markedly abnormal. She saw Dr. Gwenlyn Found recently, peripheral arterial dopplers in 4/21 showed significant stenosis right SFA.   We worked her up for LVAD.  She was presented at [facility] and saw Dr. Prescott Gum.  He was concerned by her small stature and small LV cavity. Concern that Heartmate 3 would not function well in her small LV, and Heartware LVAD that was thought to be more suitable for small patients has been discontinued.   We considered her for a barostimulation trial, but she does not qualify for either ANTHEM or Batwire due to carotid disease.   She is off dapagliflozin due to recurrrent GU yeast infections.  In 7/21, she had atherectomy of right SFA.  [place] placed in 10/21.   As she was turned down for LVAD at [facility], I had her seen at [facility]. She underwent workup there with echo in 11/21 showing higher EF 35%. RHC, however, showed low CI 1.8.    Echo in 5/22 showed improvement in EF, now up to 45-50% with basal-mid inferolateral and anterolateral akinesis, normal RV.   Underwent  bronchoscopy 12/19 for pulmonary nodules, suspicious for malignancy.  Today she returns for HF follow up. Remains mildly SOB walking on flat ground longer distances with walker, feels it is worse lately. Has leg weakness and has fallen a couple times in the past month. Continues with leg claudication. This seems to be main functional limitor. Checks BP when she feels weak and it has been as low as 80s/50s, recent BP readings have been ok. Had CP after bronch this week, relieved with Nitro. Denies palpitations, edema, or PND/Orthopnea. Appetite ok. No fever or chills. Weight at home 129 pounds. Taking all medications. Smoking a little less than a pack a day.  ECG (personally reviewed): Discussed repeating ECG today, she declined.   [employer] device interrogation: Heartlogic score 16, 1.2 hours daily activity, no shocks (personally reviewed).               Labs (4/21): K 3.7 => 3.4, creatinine 0.78 => 0.7, hgb 11.3 => 14.4 Labs (5/21): digoxin 0.6, K 3.5, creatinine 0.63 Labs (6/21): K 4.1, creatinine 0.7, LDL 61, TGs 247 Labs (7/21): K 5, creatinine 0.76, hgb 16.9.  Labs (10/21): K 4.6, creatinine 0.86 Labs (11/21): digoxin 0.9, LDL 45, HDL 27, K 4, creatinine 0.86 Labs (4/22): hgb 14.4, K 4.9, creatinine 0.81, digoxin 0.7 Labs (9/22): hgb 14.5, K 4.9, creatinine 0.8, LDL 38  PMH: 1. CAD: CABG 2007.  - LHC in 2007 showed  all grafts closed except LIMA-LAD.  Patient had DES to LCx/OM.   - [place] 2016 showed occlusion of DES to LCx/OM with L-L and L-R collaterals.  - Not candidate for redo CABG with poor targets.  2. Chronic systolic CHF: Ischemic cardiomyopathy.   - Echo (2/21): EF 20-25%, moderately dilated and moderately dysfunctional RV, PASP 49, severe biatrial enlargement, moderate-severe MR, severe TR, mild-moderate AS, IVC dilated.  - RHC (4/21): mean RA 6, PA 41/16, mean 29, mean PCWP 15, CI 2.12, PVR 4.3 WU - CPX (5/21): peak VO2 13, VE/VCO2 slope 37, RER 1.06 => moderate HF  limitation.  - Echo (11/21): EF 35%, mild LVH.  - RHC (11/21): baseline CI 1.8, exercise peak VO2 12.2, PCWP up to 24 with exercise.  - Echo (5/22): EF 45-50%, basal-mid inferolateral and anterolateral akinesis, normal RV.  3. Fe deficiency anemia: FOBT negative.  4. Carotid stenosis: Right carotid stent in 1/19.  - Carotid dopplers (6/83): 41-96% LICA stenosis.  - Carotid dopplers (2/22): 97-98% LICA stenosis.  - Carotid dopplers (9/21): 19-41% LICA stenosis.  5. H/o CVA 6. HTN 7. Type 2 diabetes 8. Hyperlipidemia 9. Seizure disorder 10. OSA: Cannot tolerate CPAP.  11. Active smoker: PFTs in 2019 were relatively normal.  She does use oxygen at night.  - PFTs (5/21): FEV1 82%, FVC 80%, ratio 102% => minimal obstruction.  12. PAD: ABIs (4/21) 0.76 on right, 0.86 on left => significant right SFA stenosis.  - Atherectomy/angioplasty right SFA.  - Peripheral arterial dopplers (2/22): 50-74% stenosis right SFA.  13. Chronic penetrating atherosclerotic ulcer descending thoracic aorta.   Social History   Socioeconomic History   Marital status: Divorced    Spouse name: Not on file   Number of children: 0   Years of education: Not on file   Highest education level: Not on file  Occupational History   Occupation: disabled  Tobacco Use   Smoking status: Every Day    Packs/day: 1.00    Years: 51.00    Pack years: 51.00    Types: Cigarettes    Start date: 52    Last attempt to quit: 02/22/2020    Years since quitting: 1.5   Smokeless tobacco: Never   Tobacco comments:    Pt smokes a 1 pack a day 08/30/2021 / pl  Vaping Use   Vaping Use: Former  Substance and Sexual Activity   Alcohol use: No   Drug use: No   Sexual activity: Not Currently    Partners: Male    Birth control/protection: None  Other Topics Concern   Not on file  Social History Narrative   Right handed   Lives alone in a one story home   Social Determinants of Health   Financial Resource Strain: Not on  file  Food Insecurity: Not on file  Transportation Needs: Not on file  Physical Activity: Not on file  Stress: Not on file  Social Connections: Not on file  Intimate Partner Violence: Not on file   Family History  Problem Relation Age of Onset   Heart disease Mother    Diabetes Mother    COPD Mother    Hyperlipidemia Mother    Hypertension Mother    Cancer Father        met, origin unknown   Heart attack Father    Drug abuse Paternal Grandmother    Stroke Paternal Grandfather    Lung cancer Paternal Aunt        lung with mets to brain  Melanoma Paternal Uncle    Lung cancer Paternal Uncle        lung/liver to brain   Cancer Paternal Uncle        cancer of unknown type   Heart disease Maternal Grandfather    Hypertension Sister    Cancer Sister        eyelid   Glaucoma Sister    Heart disease Maternal Aunt        x 2 aunts   ROS: All systems reviewed and negative except as per HPI.   Current Outpatient Medications  Medication Sig Dispense Refill   albuterol (PROVENTIL) (2.5 MG/3ML) 0.083% nebulizer solution Take 3 mLs (2.5 mg total) by nebulization every 6 (six) hours as needed for wheezing or shortness of breath. 1180 mL 3   albuterol (VENTOLIN HFA) 108 (90 Base) MCG/ACT inhaler Inhale 2 puffs into the lungs every 6 (six) hours as needed for wheezing or shortness of breath. 24 g 3   aspirin EC 81 MG tablet Take 81 mg by mouth every evening.  30 tablet 6   clopidogrel (PLAVIX) 75 MG tablet Take 1 tablet (75 mg total) by mouth daily. 90 tablet 3   dexlansoprazole (DEXILANT) 60 MG capsule Take 1 capsule (60 mg total) by mouth every evening.     digoxin (LANOXIN) 0.125 MG tablet Take 0.5 tablets (0.0625 mg total) by mouth daily. 45 tablet 3   diphenhydrAMINE (BENADRYL) 25 mg capsule Take 25 mg by mouth 2 (two) times daily as needed for itching or allergies.      ezetimibe (ZETIA) 10 MG tablet Take 1 tablet (10 mg total) by mouth daily. 90 tablet 3   fluticasone (FLONASE)  50 MCG/ACT nasal spray Place 2 sprays into both nostrils daily. (Patient taking differently: Place 2 sprays into both nostrils daily as needed for allergies.) 48 g 3   gabapentin (NEURONTIN) 300 MG capsule Take 2 capsules (600 mg total) by mouth at bedtime. TAKE 2 CAPSULES BY MOUTH EVERY DAY AT BEDTIME     glucose blood (ONETOUCH ULTRA) test strip 3 (three) times daily.     guaiFENesin (MUCINEX) 600 MG 12 hr tablet Take 2 tablets (1,200 mg total) by mouth 2 (two) times daily. 360 tablet 3   HYDROcodone-acetaminophen (NORCO) 5-325 MG tablet Take 1 tablet by mouth daily as needed for severe pain. 15 tablet 0   insulin aspart protamine- aspart (NOVOLOG MIX 70/30) (70-30) 100 UNIT/ML injection Inject 5-65 Units into the skin See admin instructions. 65 units in the Am with breakfast and 5 units with lunch and 55 units with dinner     levETIRAcetam (KEPPRA) 1000 MG tablet Take 1 tablet (1,000 mg total) by mouth 2 (two) times daily.     metFORMIN (GLUCOPHAGE-XR) 500 MG 24 hr tablet Take 500 mg by mouth every evening.   6   metolazone (ZAROXOLYN) 2.5 MG tablet Take 1 tablet (2.5 mg total) by mouth 2 (two) times a week. Every Mon and Fri 30 tablet 3   nicotine (NICODERM CQ - DOSED IN MG/24 HOURS) 21 mg/24hr patch Place 21 mg onto the skin daily as needed (to cease smoking).     nicotine polacrilex (NICORETTE) 4 MG gum Take 4 mg by mouth daily as needed for smoking cessation.     nitroGLYCERIN (NITROLINGUAL) 0.4 MG/SPRAY spray Place 1 spray under the tongue every 5 (five) minutes x 3 doses as needed for chest pain. 12 g 3   nystatin (MYCOSTATIN/NYSTOP) powder APPLY 1 APPLICATION  TOPICALLY 4 TIMES A DAY AS NEEDED FOR YEAST INFECTIONS 60 g 5   ONE TOUCH ULTRA TEST test strip USE AS DIRECTED FOR TESTING BLOOD GLUCOSE 3 TIMES DAILY  1   polyethylene glycol (MIRALAX / GLYCOLAX) 17 g packet Take 17 g by mouth daily.     potassium chloride SA (KLOR-CON M20) 20 MEQ tablet Take 1 tablet (20 mEq total) by mouth 2  (two) times daily. TAKE 1 TABLET TWICE A DAY     promethazine (PHENERGAN) 25 MG tablet Take 1 tablet (25 mg total) by mouth every 8 (eight) hours as needed for nausea or vomiting. Caution of sedation 30 tablet 1   ranolazine (RANEXA) 1000 MG SR tablet Take 1 tablet (1,000 mg total) by mouth 2 (two) times daily. 180 tablet 3   rosuvastatin (CRESTOR) 20 MG tablet Take 1 tablet (20 mg total) by mouth at bedtime.     sodium chloride (OCEAN) 0.65 % SOLN nasal spray Place 1 spray into both nostrils as needed for congestion.     spironolactone (ALDACTONE) 25 MG tablet Take 1 tablet (25 mg total) by mouth at bedtime. TAKE 1 TABLET BY MOUTH EVERYDAY AT BEDTIME     torsemide (DEMADEX) 20 MG tablet Take 3 tablets (60 mg total) by mouth every morning AND 2 tablets (40 mg total) every evening. 450 tablet 3   triamcinolone cream (KENALOG) 0.5 % Apply 1 application topically 3 (three) times daily as needed (rash). To affected areas     Vericiguat 10 MG TABS Take 10 mg by mouth daily.     acetaminophen (TYLENOL) 500 MG tablet Take 1,000 mg by mouth at bedtime.     icosapent Ethyl (VASCEPA) 1 g capsule Take 2 capsules (2 g total) by mouth 2 (two) times daily. TAKE 2 CAPSULES BY MOUTH 2 TIMES DAILY. (Patient not taking: Reported on 09/20/2021)     No current facility-administered medications for this encounter.   Wt Readings from Last 3 Encounters:  09/20/21 59.5 kg (131 lb 3.2 oz)  09/18/21 57.6 kg (126 lb 14.4 oz)  08/30/21 59.6 kg (131 lb 6.4 oz)   BP 110/70    Pulse 93    Wt 59.5 kg (131 lb 3.2 oz)    LMP 09/02/2003    SpO2 97%    BMI 28.39 kg/m  General:  NAD. No resp difficulty, walked into clinic with RW, chronically ill appearing HEENT: Normal Neck: Supple. No JVD. Carotids 2+ bilat; no bruits. No lymphadenopathy or thryomegaly appreciated. Cor: PMI nondisplaced. Regular rate & rhythm. No rubs, gallops, 1/6 SEM RUSB Lungs: Wheeze RUL Abdomen: Soft, nontender, nondistended. No hepatosplenomegaly. No  bruits or masses. Good bowel sounds. Extremities: No cyanosis, clubbing, rash, edema Neuro: Alert & oriented x 3, cranial nerves grossly intact. Moves all 4 extremities w/o difficulty. Affect pleasant.  Assessment/Plan:  1. Chronic systolic CHF: Ischemic cardiomyopathy, [employer] ICD.  Echo in 2/21 showed EF 20-25%, moderately dilated and moderately dysfunctional RV, PASP 49, severe biatrial enlargement, moderate-severe MR, severe TR, mild-moderate AS, IVC dilated. CPX (5/21) with moderate HF limitation, RHC (4/21) with CI low at 2.12.  Medication titration has been severely limited by hypotension and orthostatic symptoms.  Not a transplant candidate due to smoking. She was seen by cardiac surgery at [facility] and not thought to be good LVAD candidate due to small stature, small LV cavity, and redo sternotomy.  I sent her for a 2nd opinion at [facility].  She had echo there in 11/21 with EF higher  at 35% but CI low on RHC at 1.8, no LVAD planned based on higher EF.  Echo in 5/22 showed EF yet again increased to 45-50%.  NYHA class II-early III symptoms, more limited by claudication than dyspnea, but states breathing is slightly worse recently.  She is not volume overloaded on exam, however Heartlogic is 16, weight up 2 lbs.  - With worsening dyspnea, increase torsemide to 60 mg bid + increase KCl to 40 AM/20 PM. CMET 12/19 stable. BMET in 10-14 days. - Continue digoxin 0.0625 mg daily. She took her dose today, check dig next lab visit as trough. - Continue metolazone 2.5 mg bid, Tuesday and Saturday.   - Continue spironolactone 25 mg daily.  - She has not tolerated BP- active meds well, avoid Coreg and Entresto for now.  She became hypotensive with losartan.  - She did not tolerate SGLT2 inhibitor due to recurrent GU yeast infections.  - Continue vericiguat 10 mg daily.  - She does not qualify for the barostimulation trials due to carotid disease.  - With higher EF, she is not a candidate for  LVAD. 2. CAD: s/p CABG 3810 complicated by early graft closure.  Repeat cath in 2007 showed only LIMA-LAD still open and she had DES to LCX/OM.  Cath in 2016 showed this stent was occluded.  She has not had targets for redo CABG and she does not have good interventional targets. Discussed repeating ECG today as she had recent atypical CP, she declined.  - Continue ASA 81, Plavix 75 mg daily.  - Continue ranolazone 1000 bid.   - Continue Crestor 20 mg daily and Zetia 10 mg daily.  Refill Vascepa. Good lipids in 9/22. 3. Smoking: Still smoking ~1 ppd.  She is trying to quit.  - I again recommended nicotine patches.  4. PAD: s/p atherectomy/angioplasty right SFA in 7/21.  Still with significant claudication. Peripheral arterial dopplers in 2/22 with 50-74% right SFA stenosis.  - Follows with Dr. Gwenlyn Found for PV.  5. Carotid stenosis: Moderate stenosis, repeat dopplers 6/23.   6. Fe deficiency anemia: Has been FOBT negative.  Has had Ferahema.   7. Lung nodule: Awaiting path from recent bronch, CT findings are highly concerning for primary bronchogenic malignancy.  Followup 3 months with Dr. Aundra Dubin.   [place] FNP 09/20/2021

## 2021-09-20 NOTE — Patient Instructions (Signed)
INCREASE Torsemide to 60 mg twice a day INCREASE Potassium to 40 meq in the AM and 20 meq in the PM   Labs needed in 10-14 days -be sure to HOLD digoxin on the day of labs   Your physician recommends that you schedule a follow-up appointment in: 3 months with Dr Aundra Dubin  Do the following things EVERYDAY: Weigh yourself in the morning before breakfast. Write it down and keep it in a log. Take your medicines as prescribed Eat low salt foods--Limit salt (sodium) to 2000 mg per day.  Stay as active as you can everyday Limit all fluids for the day to less than 2 liters  At the [facility], you and your health needs are our priority. As part of our continuing mission to provide you with exceptional heart care, we have created designated Provider Care Teams. These Care Teams include your primary Cardiologist (physician) and Advanced Practice Providers (APPs- Physician Assistants and Nurse Practitioners) who all work together to provide you with the care you need, when you need it.   You may see any of the following providers on your designated Care Team at your next follow up: Dr Glori Bickers Dr Haynes Kerns, NP Lyda Jester, [place] [facility] [place], [place] Audry Riles, PharmD   Please be sure to bring in all your medications bottles to every appointment.

## 2021-09-21 ENCOUNTER — Encounter: Payer: Self-pay | Admitting: Family Medicine

## 2021-09-21 ENCOUNTER — Encounter (HOSPITAL_COMMUNITY): Payer: Self-pay | Admitting: Emergency Medicine

## 2021-09-22 ENCOUNTER — Telehealth: Payer: Self-pay | Admitting: Emergency Medicine

## 2021-09-22 DIAGNOSIS — C3490 Malignant neoplasm of unspecified part of unspecified bronchus or lung: Secondary | ICD-10-CM

## 2021-09-22 LAB — CYTOLOGY - NON PAP

## 2021-09-22 NOTE — Telephone Encounter (Signed)
Call the patient to discuss her bronchoscopy pathology results.  Her right upper lobe nodules and station 7 lymph node both show small cell lung cancer.  I will make a referral to the [facility] in [place].

## 2021-09-26 ENCOUNTER — Telehealth: Payer: Self-pay | Admitting: *Deleted

## 2021-09-26 ENCOUNTER — Encounter: Payer: Self-pay | Admitting: *Deleted

## 2021-09-26 NOTE — Telephone Encounter (Signed)
I received referral on patient. I called to schedule her to be seen with Dr. Julien Nordmann but was unable to reach. I did leave vm message with my name and phone number to call.

## 2021-09-26 NOTE — Progress Notes (Signed)
Oncology Nurse Navigator Documentation  Oncology Nurse Navigator Flowsheets 09/26/2021  Abnormal Finding Date 09/11/2021  Confirmed Diagnosis Date 09/18/2021  Diagnosis Status Confirmed Diagnosis Complete  Navigator Follow Up Reason: New Patient Appointment  Navigation Complete Date: 10/04/2021  Navigator Location [facility]  Referral Date to RadOnc/MedOnc 09/26/2021  Navigator Encounter Type Telephone  Telephone Outgoing Call  Treatment Phase Pre-Tx/Tx Discussion  Barriers/Navigation Needs Coordination of Care;Education  Education Other  Interventions Coordination of Care;Psycho-Social Support;Education  Acuity Level 2-Minimal Needs (1-2 Barriers Identified)  Coordination of Care Appts  Education Method Verbal  Time Spent with Patient 30

## 2021-09-28 DIAGNOSIS — C3490 Malignant neoplasm of unspecified part of unspecified bronchus or lung: Secondary | ICD-10-CM | POA: Diagnosis not present

## 2021-09-29 ENCOUNTER — Other Ambulatory Visit: Payer: Self-pay

## 2021-09-29 DIAGNOSIS — D509 Iron deficiency anemia, unspecified: Secondary | ICD-10-CM

## 2021-09-30 DIAGNOSIS — J449 Chronic obstructive pulmonary disease, unspecified: Secondary | ICD-10-CM | POA: Diagnosis not present

## 2021-09-30 DIAGNOSIS — E119 Type 2 diabetes mellitus without complications: Secondary | ICD-10-CM | POA: Diagnosis not present

## 2021-09-30 DIAGNOSIS — I5042 Chronic combined systolic (congestive) and diastolic (congestive) heart failure: Secondary | ICD-10-CM | POA: Diagnosis not present

## 2021-10-03 ENCOUNTER — Ambulatory Visit (HOSPITAL_COMMUNITY): Admission: RE | Admit: 2021-10-03 | Payer: Medicare Other | Source: Ambulatory Visit

## 2021-10-03 ENCOUNTER — Telehealth (HOSPITAL_COMMUNITY): Payer: Self-pay | Admitting: Cardiology

## 2021-10-03 NOTE — Telephone Encounter (Signed)
Pt called to notify staff she will have labs drawn [facility] tomorrow, and a biopsy was taken, it was cancerous, please advise

## 2021-10-04 ENCOUNTER — Inpatient Hospital Stay: Payer: Medicare Other | Attending: Internal Medicine | Admitting: Internal Medicine

## 2021-10-04 ENCOUNTER — Other Ambulatory Visit: Payer: Self-pay

## 2021-10-04 ENCOUNTER — Encounter: Payer: Self-pay | Admitting: Internal Medicine

## 2021-10-04 ENCOUNTER — Encounter: Payer: Self-pay | Admitting: *Deleted

## 2021-10-04 ENCOUNTER — Inpatient Hospital Stay: Payer: Medicare Other

## 2021-10-04 VITALS — BP 118/56 | HR 97 | Temp 97.0°F | Resp 18 | Ht <= 58 in | Wt 131.9 lb

## 2021-10-04 DIAGNOSIS — D509 Iron deficiency anemia, unspecified: Secondary | ICD-10-CM

## 2021-10-04 DIAGNOSIS — Z5112 Encounter for antineoplastic immunotherapy: Secondary | ICD-10-CM | POA: Insufficient documentation

## 2021-10-04 DIAGNOSIS — C3411 Malignant neoplasm of upper lobe, right bronchus or lung: Secondary | ICD-10-CM | POA: Insufficient documentation

## 2021-10-04 DIAGNOSIS — C3490 Malignant neoplasm of unspecified part of unspecified bronchus or lung: Secondary | ICD-10-CM | POA: Insufficient documentation

## 2021-10-04 DIAGNOSIS — C7972 Secondary malignant neoplasm of left adrenal gland: Secondary | ICD-10-CM | POA: Insufficient documentation

## 2021-10-04 DIAGNOSIS — R569 Unspecified convulsions: Secondary | ICD-10-CM

## 2021-10-04 DIAGNOSIS — Z5111 Encounter for antineoplastic chemotherapy: Secondary | ICD-10-CM | POA: Diagnosis not present

## 2021-10-04 DIAGNOSIS — I5022 Chronic systolic (congestive) heart failure: Secondary | ICD-10-CM

## 2021-10-04 DIAGNOSIS — Z79899 Other long term (current) drug therapy: Secondary | ICD-10-CM | POA: Diagnosis not present

## 2021-10-04 LAB — CBC WITH DIFFERENTIAL (CANCER CENTER ONLY)
Abs Immature Granulocytes: 0.02 10*3/uL (ref 0.00–0.07)
Basophils Absolute: 0 10*3/uL (ref 0.0–0.1)
Basophils Relative: 1 %
Eosinophils Absolute: 0 10*3/uL (ref 0.0–0.5)
Eosinophils Relative: 1 %
HCT: 29.2 % — ABNORMAL LOW (ref 36.0–46.0)
Hemoglobin: 8.7 g/dL — ABNORMAL LOW (ref 12.0–15.0)
Immature Granulocytes: 0 %
Lymphocytes Relative: 18 %
Lymphs Abs: 1.1 10*3/uL (ref 0.7–4.0)
MCH: 23.5 pg — ABNORMAL LOW (ref 26.0–34.0)
MCHC: 29.8 g/dL — ABNORMAL LOW (ref 30.0–36.0)
MCV: 78.7 fL — ABNORMAL LOW (ref 80.0–100.0)
Monocytes Absolute: 0.6 10*3/uL (ref 0.1–1.0)
Monocytes Relative: 10 %
Neutro Abs: 4.2 10*3/uL (ref 1.7–7.7)
Neutrophils Relative %: 70 %
Platelet Count: 262 10*3/uL (ref 150–400)
RBC: 3.71 MIL/uL — ABNORMAL LOW (ref 3.87–5.11)
RDW: 16.9 % — ABNORMAL HIGH (ref 11.5–15.5)
WBC Count: 6 10*3/uL (ref 4.0–10.5)
nRBC: 0 % (ref 0.0–0.2)

## 2021-10-04 LAB — CMP (CANCER CENTER ONLY)
ALT: 12 U/L (ref 0–44)
AST: 15 U/L (ref 15–41)
Albumin: 3.7 g/dL (ref 3.5–5.0)
Alkaline Phosphatase: 67 U/L (ref 38–126)
Anion gap: 8 (ref 5–15)
BUN: 15 mg/dL (ref 8–23)
CO2: 28 mmol/L (ref 22–32)
Calcium: 9.1 mg/dL (ref 8.9–10.3)
Chloride: 98 mmol/L (ref 98–111)
Creatinine: 0.93 mg/dL (ref 0.44–1.00)
GFR, Estimated: >60 mL/min (ref >60)
Glucose, Bld: 277 mg/dL — ABNORMAL HIGH (ref 70–99)
Potassium: 3.9 mmol/L (ref 3.5–5.1)
Sodium: 134 mmol/L — ABNORMAL LOW (ref 135–145)
Total Bilirubin: 0.3 mg/dL (ref 0.3–1.2)
Total Protein: 7 g/dL (ref 6.5–8.1)

## 2021-10-04 LAB — DIGOXIN LEVEL: Digoxin Level: 0.3 ng/mL — ABNORMAL LOW (ref 0.8–2.0)

## 2021-10-04 MED ORDER — LIDOCAINE-PRILOCAINE 2.5-2.5 % EX CREA: TOPICAL_CREAM | CUTANEOUS | 0 refills | Status: DC

## 2021-10-04 MED ORDER — PROCHLORPERAZINE MALEATE 10 MG PO TABS: 10.0000 mg | ORAL_TABLET | Freq: Four times a day (QID) | ORAL | 0 refills | Status: DC | PRN

## 2021-10-04 NOTE — Progress Notes (Signed)
[place] [phone]   [phone]  CONSULT NOTE  REFERRING PHYSICIAN: Dr. [name]  REASON FOR CONSULTATION:  66 years old white female recently diagnosed with lung cancer.  HPI Diane [name] is a 66 y.o. female with past medical history significant for multiple medical problems including history of iron deficiency anemia, osteoarthritis, COPD, hypertension, dyslipidemia, coronary artery disease, carotid stenosis, GERD, fibromyalgia, history of seizure 8 years ago, history of stroke 1993 as well as history of diabetes mellitus and sleep apnea.  The patient also has a long history of smoking.  She was seen by Dr. Halford Chessman for routine evaluation of her interstitial lung disease as well as COPD.  Because of the smoking history she had CT scan of the chest without contrast performed on September 08, 2021 and that showed multiple new pulmonary nodules in the right upper lobe the largest measured 1.5 x 1.1 cm associated with bulky right hilar and right paratracheal lymphadenopathy concerning for primary bronchogenic malignancy.  There was also new left adrenal nodule measuring 2.0 x 1.9 cm concerning for potential metastatic disease.  The patient was seen by Dr. Lamonte Sakai and on September 18, 2021 she underwent video bronchoscopy with bronchoscopic navigation and EBUS.  The final pathology ([id number]) from the fine-needle aspiration of the right upper lobe lung nodule as well as the 10 R lymph node were consistent with malignant cells, small cell carcinoma. The patient was referred to me today for evaluation and recommendation regarding treatment of her condition. When seen today she is feeling tired and fatigue in addition to low back pain as well as pain on the right hip.  She also has intermittent headache as well as chest pain and shortness of breath with exertion with mild cough.  She has no hemoptysis.  She denied having any recent weight loss or night sweats.   She has nausea but no vomiting, diarrhea or constipation.  She has no visual changes. Family history significant for mother with history of heart disease and diabetes mellitus.  Father had lung cancer. The patient is single and has no children.  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  She has a history of smoking up to 2.5 pack/day for around 54 years but now down to half a pack per day.  She also has a history of alcohol drinking in the past and no history of drug abuse.   HPI  Past Medical History:  Diagnosis Date   Anemia    Arthritis    Carotid stenosis    a. s/p right carotid stent 10/2017.   Chronic systolic CHF (congestive heart failure) (HCC)    Colon polyps    COPD (chronic obstructive pulmonary disease) (HCC)    Coronary artery disease    post CABG in 3/07 , coronary stents    Depression    after major stroke years ago, no longer treated   Diabetes mellitus    type 2   Dyslipidemia    Fatty liver    Fibromyalgia    GERD (gastroesophageal reflux disease)    Headache    hx of    History of kidney stones    Hyperlipidemia    Hypertension    Myocardial infarction ([place])    Pneumonia    hx of    Seizures ([place])    last seizure- 03/2013    Shortness of breath dyspnea  with exertion or when fluid builds up    Sleep apnea    used to wear a cpap- not used in 3 years    Status post dilation of esophageal narrowing    Stroke [facility]) 1993   problems with balance    Systolic murmur    known mild AS and MR   Thyroid goiter    Tobacco abuse     Past Surgical History:  Procedure Laterality Date   ABDOMINAL AORTOGRAM W/LOWER EXTREMITY N/A 04/18/2020   Procedure: ABDOMINAL AORTOGRAM W/LOWER EXTREMITY;  Surgeon: Lorretta Harp, MD;  Location: [place] CV LAB;  Service: Cardiovascular;  Laterality: N/A;   APPENDECTOMY     BIOPSY  02/03/2019   Procedure: BIOPSY;  Surgeon: Ladene Artist, MD;   Location: [facility] ENDOSCOPY;  Service: Endoscopy;;   BREAST BIOPSY Left 2016   BRONCHIAL BIOPSY  09/18/2021   Procedure: BRONCHIAL BIOPSIES;  Surgeon: Collene Gobble, MD;  Location: [facility] ENDOSCOPY;  Service: Pulmonary;;   BRONCHIAL BRUSHINGS  09/18/2021   Procedure: BRONCHIAL BRUSHINGS;  Surgeon: Collene Gobble, MD;  Location: [facility] ENDOSCOPY;  Service: Pulmonary;;   BRONCHIAL NEEDLE ASPIRATION BIOPSY  09/18/2021   Procedure: BRONCHIAL NEEDLE ASPIRATION BIOPSIES;  Surgeon: Collene Gobble, MD;  Location: [facility] ENDOSCOPY;  Service: Pulmonary;;   CARDIAC CATHETERIZATION  11/29/05   EF of 55%   CARDIAC CATHETERIZATION  08/06/06   EF of 45-50%   CARDIAC CATHETERIZATION N/A 05/06/2015   Procedure: Right/Left Heart Cath and Coronary/Graft Angiography;  Surgeon: Peter M Martinique, MD;  Location: [place] CV LAB;  Service: Cardiovascular;  Laterality: N/A;   CAROTID PTA/STENT INTERVENTION N/A 10/10/2017   Procedure: CAROTID PTA/STENT INTERVENTION - Right;  Surgeon: Serafina Mitchell, MD;  Location: [place] CV LAB;  Service: Cardiovascular;  Laterality: N/A;   CERVICAL FUSION  1990   CHOLECYSTECTOMY     COLON RESECTION     mass removed and 4 in of colon   COLONOSCOPY WITH PROPOFOL N/A 07/16/2017   Procedure: COLONOSCOPY WITH PROPOFOL;  Surgeon: Ladene Artist, MD;  Location: [facility] ENDOSCOPY;  Service: Endoscopy;  Laterality: N/A;   CORONARY ARTERY BYPASS GRAFT  12/04/2005   x5 -- left internal mammary artery to the LAD, left radial artery to the ramus intermedius, saphenous vein graft to the obtuse marginal 1, sequential saphenous vein grat to the acute marginal and posterior descending, endoscopic vein harvesting from the left thigh with open vein harvest from right leg   CORONARY STENT PLACEMENT  08/11/06   PCI of her ciurcumflex/OM vessel   ESOPHAGOGASTRODUODENOSCOPY (EGD) WITH PROPOFOL N/A 02/03/2019   Procedure: ESOPHAGOGASTRODUODENOSCOPY (EGD) WITH PROPOFOL;  Surgeon: Ladene Artist, MD;  Location: [facility]  ENDOSCOPY;  Service: Endoscopy;  Laterality: N/A;   FINE NEEDLE ASPIRATION  09/18/2021   Procedure: FINE NEEDLE ASPIRATION (FNA) LINEAR;  Surgeon: Collene Gobble, MD;  Location: [facility] ENDOSCOPY;  Service: Pulmonary;;   ICD IMPLANT N/A 07/15/2020   Procedure: ICD IMPLANT;  Surgeon: Vickie Epley, MD;  Location: [place] CV LAB;  Service: Cardiovascular;  Laterality: N/A;   LAPAROTOMY Bilateral 05/19/2015   Procedure: EXPLORATORY LAPAROTOMY WITH BILATERAL SALPINGO OOPHORECTOMY /OMENTECTOMY/SEGMENTAL SIGMOID COLECTOMY ;  Surgeon: Everitt Amber, MD;  Location: [facility] ORS;  Service: Gynecology;  Laterality: Bilateral;   PERIPHERAL VASCULAR BALLOON ANGIOPLASTY  04/18/2020   Procedure: PERIPHERAL VASCULAR BALLOON ANGIOPLASTY;  Surgeon: Lorretta Harp, MD;  Location: [place] CV LAB;  Service: Cardiovascular;;  rt SFA  atherectomy and DCB   PERIPHERAL VASCULAR  CATHETERIZATION N/A 11/15/2015   Procedure: Carotid PTA/Stent Intervention;  Surgeon: Lorretta Harp, MD;  Location: [place] CV LAB;  Service: Cardiovascular;  Laterality: N/A;   PERIPHERAL VASCULAR CATHETERIZATION  11/15/2015   Procedure: Carotid Angiography;  Surgeon: Lorretta Harp, MD;  Location: [place] CV LAB;  Service: Cardiovascular;;   RIGHT HEART CATH N/A 01/27/2020   Procedure: RIGHT HEART CATH;  Surgeon: Larey Dresser, MD;  Location: [place] CV LAB;  Service: Cardiovascular;  Laterality: N/A;   VIDEO BRONCHOSCOPY WITH ENDOBRONCHIAL ULTRASOUND N/A 09/18/2021   Procedure: VIDEO BRONCHOSCOPY WITH ENDOBRONCHIAL ULTRASOUND;  Surgeon: Collene Gobble, MD;  Location: [place] ENDOSCOPY;  Service: Pulmonary;  Laterality: N/A;   VIDEO BRONCHOSCOPY WITH RADIAL ENDOBRONCHIAL ULTRASOUND  09/18/2021   Procedure: RADIAL ENDOBRONCHIAL ULTRASOUND;  Surgeon: Collene Gobble, MD;  Location: [facility] ENDOSCOPY;  Service: Pulmonary;;    Family History  Problem Relation Age of Onset   Heart disease Mother    Diabetes Mother    COPD Mother     Hyperlipidemia Mother    Hypertension Mother    Cancer Father        met, origin unknown   Heart attack Father    Drug abuse Paternal Grandmother    Stroke Paternal Grandfather    Lung cancer Paternal Aunt        lung with mets to brain   Melanoma Paternal Uncle    Lung cancer Paternal Uncle        lung/liver to brain   Cancer Paternal Uncle        cancer of unknown type   Heart disease Maternal Grandfather    Hypertension Sister    Cancer Sister        eyelid   Glaucoma Sister    Heart disease Maternal Aunt        x 2 aunts    Social History Social History   Tobacco Use   Smoking status: Every Day    Packs/day: 1.00    Years: 51.00    Pack years: 51.00    Types: Cigarettes    Start date: 47    Last attempt to quit: 02/22/2020    Years since quitting: 1.6   Smokeless tobacco: Never   Tobacco comments:    Pt smokes a 1 pack a day 08/30/2021 / pl  Vaping Use   Vaping Use: Former  Substance Use Topics   Alcohol use: No   Drug use: No    Allergies  Allergen Reactions   Bee Venom Itching and Swelling   Benadryl [Diphenhydramine] Other (See Comments)    Insomnia    Entresto [Sacubitril-Valsartan] Cough   Farxiga [Dapagliflozin]     Yeast infection   Tetracycline     Unknown, pt cannot recall exact reaction   Canagliflozin Other (See Comments)   Other Other (See Comments)   Sitagliptin Other (See Comments)   Ace Inhibitors Cough    Current Outpatient Medications  Medication Sig Dispense Refill   acetaminophen (TYLENOL) 500 MG tablet Take 1,000 mg by mouth at bedtime.     albuterol (PROVENTIL) (2.5 MG/3ML) 0.083% nebulizer solution Take 3 mLs (2.5 mg total) by nebulization every 6 (six) hours as needed for wheezing or shortness of breath. 1180 mL 3   albuterol (VENTOLIN HFA) 108 (90 Base) MCG/ACT inhaler Inhale 2 puffs into the lungs every 6 (six) hours as needed for wheezing or shortness of breath. 24 g 3   aspirin EC 81 MG tablet Take 81 mg by  mouth every  evening.  30 tablet 6   clopidogrel (PLAVIX) 75 MG tablet Take 1 tablet (75 mg total) by mouth daily. 90 tablet 3   dexlansoprazole (DEXILANT) 60 MG capsule Take 1 capsule (60 mg total) by mouth every evening.     digoxin (LANOXIN) 0.125 MG tablet Take 0.5 tablets (0.0625 mg total) by mouth daily. 45 tablet 3   diphenhydrAMINE (BENADRYL) 25 mg capsule Take 25 mg by mouth 2 (two) times daily as needed for itching or allergies.      ezetimibe (ZETIA) 10 MG tablet Take 1 tablet (10 mg total) by mouth daily. 90 tablet 3   fluticasone (FLONASE) 50 MCG/ACT nasal spray Place 2 sprays into both nostrils daily. (Patient taking differently: Place 2 sprays into both nostrils daily as needed for allergies.) 48 g 3   gabapentin (NEURONTIN) 300 MG capsule Take 2 capsules (600 mg total) by mouth at bedtime. TAKE 2 CAPSULES BY MOUTH EVERY DAY AT BEDTIME     glucose blood (ONETOUCH ULTRA) test strip 3 (three) times daily.     guaiFENesin (MUCINEX) 600 MG 12 hr tablet Take 2 tablets (1,200 mg total) by mouth 2 (two) times daily. 360 tablet 3   HYDROcodone-acetaminophen (NORCO) 5-325 MG tablet Take 1 tablet by mouth daily as needed for severe pain. 15 tablet 0   icosapent Ethyl (VASCEPA) 1 g capsule Take 2 capsules (2 g total) by mouth 2 (two) times daily. 360 capsule 3   insulin aspart protamine- aspart (NOVOLOG MIX 70/30) (70-30) 100 UNIT/ML injection Inject 5-65 Units into the skin See admin instructions. 65 units in the Am with breakfast and 5 units with lunch and 55 units with dinner     levETIRAcetam (KEPPRA) 1000 MG tablet Take 1 tablet (1,000 mg total) by mouth 2 (two) times daily.     metFORMIN (GLUCOPHAGE-XR) 500 MG 24 hr tablet Take 500 mg by mouth every evening.   6   metolazone (ZAROXOLYN) 2.5 MG tablet Take 1 tablet (2.5 mg total) by mouth 2 (two) times a week. Every Mon and Fri 30 tablet 3   nicotine (NICODERM CQ - DOSED IN MG/24 HOURS) 21 mg/24hr patch Place 21 mg onto the skin daily as needed (to cease  smoking).     nicotine polacrilex (NICORETTE) 4 MG gum Take 4 mg by mouth daily as needed for smoking cessation.     nitroGLYCERIN (NITROLINGUAL) 0.4 MG/SPRAY spray Place 1 spray under the tongue every 5 (five) minutes x 3 doses as needed for chest pain. 12 g 3   nystatin (MYCOSTATIN/NYSTOP) powder APPLY 1 APPLICATION        TOPICALLY 4 TIMES A DAY AS NEEDED FOR YEAST INFECTIONS 60 g 5   ONE TOUCH ULTRA TEST test strip USE AS DIRECTED FOR TESTING BLOOD GLUCOSE 3 TIMES DAILY  1   polyethylene glycol (MIRALAX / GLYCOLAX) 17 g packet Take 17 g by mouth daily.     potassium chloride SA (KLOR-CON M20) 20 MEQ tablet Take 2 tablets (40 mEq total) by mouth every morning AND 1 tablet (20 mEq total) every evening. 270 tablet 3   promethazine (PHENERGAN) 25 MG tablet Take 1 tablet (25 mg total) by mouth every 8 (eight) hours as needed for nausea or vomiting. Caution of sedation 30 tablet 1   ranolazine (RANEXA) 1000 MG SR tablet Take 1 tablet (1,000 mg total) by mouth 2 (two) times daily. 180 tablet 3   rosuvastatin (CRESTOR) 20 MG tablet Take 1 tablet (20 mg total)  by mouth at bedtime.     sodium chloride (OCEAN) 0.65 % SOLN nasal spray Place 1 spray into both nostrils as needed for congestion.     spironolactone (ALDACTONE) 25 MG tablet Take 1 tablet (25 mg total) by mouth at bedtime. TAKE 1 TABLET BY MOUTH EVERYDAY AT BEDTIME     torsemide (DEMADEX) 20 MG tablet Take 3 tablets (60 mg total) by mouth 2 (two) times daily. 540 tablet 3   triamcinolone cream (KENALOG) 0.5 % Apply 1 application topically 3 (three) times daily as needed (rash). To affected areas     Vericiguat 10 MG TABS Take 10 mg by mouth daily.     No current facility-administered medications for this visit.    Review of Systems  Constitutional: positive for fatigue Eyes: negative Ears, nose, mouth, throat, and face: negative Respiratory: positive for cough, dyspnea on exertion, and pleurisy/chest pain Cardiovascular:  negative Gastrointestinal: positive for nausea Genitourinary:negative Integument/breast: negative Hematologic/lymphatic: negative Musculoskeletal:positive for back pain Neurological: positive for headaches Behavioral/Psych: negative Endocrine: negative Allergic/Immunologic: negative  Physical Exam  XIP:JASNK, healthy, no distress, well nourished, well developed, and anxious SKIN: skin color, texture, turgor are normal, no rashes or significant lesions HEAD: Normocephalic, No masses, lesions, tenderness or abnormalities EYES: normal, PERRLA, Conjunctiva are pink and non-injected EARS: External ears normal, Canals clear OROPHARYNX:no exudate, no erythema, and lips, buccal mucosa, and tongue normal  NECK: supple, no adenopathy, no JVD LYMPH:  no palpable lymphadenopathy, no hepatosplenomegaly BREAST:not examined LUNGS: clear to auscultation , and palpation HEART: regular rate & rhythm, no murmurs, and no gallops ABDOMEN:abdomen soft, non-tender, normal bowel sounds, and no masses or organomegaly BACK: Back symmetric, no curvature., No CVA tenderness EXTREMITIES:no joint deformities, effusion, or inflammation, no edema  NEURO: alert & oriented x 3 with fluent speech, no focal motor/sensory deficits  PERFORMANCE STATUS: ECOG 1  LABORATORY DATA: Lab Results  Component Value Date   WBC 9.8 09/18/2021   HGB 10.2 (L) 09/18/2021   HCT 33.6 (L) 09/18/2021   MCV 81.6 09/18/2021   PLT 278 09/18/2021      Chemistry      Component Value Date/Time   NA 129 (L) 09/18/2021 1025   NA 135 04/14/2020 1544   NA 135 (L) 02/15/2017 1457   K 3.6 09/18/2021 1025   K 4.8 02/15/2017 1457   CL 93 (L) 09/18/2021 1025   CO2 25 09/18/2021 1025   CO2 26 02/15/2017 1457   BUN 19 09/18/2021 1025   BUN 17 04/14/2020 1544   BUN 10.8 02/15/2017 1457   CREATININE 0.77 09/18/2021 1025   CREATININE 0.86 07/07/2020 1416   CREATININE 0.69 05/06/2017 1405   CREATININE 0.7 02/15/2017 1457       Component Value Date/Time   CALCIUM 8.9 09/18/2021 1025   CALCIUM 9.3 02/15/2017 1457   ALKPHOS 59 09/18/2021 1025   ALKPHOS 75 02/15/2017 1457   AST 20 09/18/2021 1025   AST 14 (L) 07/07/2020 1416   AST 9 02/15/2017 1457   ALT 13 09/18/2021 1025   ALT 14 07/07/2020 1416   ALT 13 02/15/2017 1457   BILITOT 0.7 09/18/2021 1025   BILITOT 0.4 07/07/2020 1416   BILITOT 0.28 02/15/2017 1457       RADIOGRAPHIC STUDIES: CT Chest High Resolution  Result Date: 09/11/2021 CLINICAL DATA:  66 year old female with history of chronic bronchitis and emphysema. Evaluate for interstitial lung disease. EXAM: CT CHEST WITHOUT CONTRAST TECHNIQUE: Multidetector CT imaging of the chest was performed following the standard protocol without  intravenous contrast. High resolution imaging of the lungs, as well as inspiratory and expiratory imaging, was performed. COMPARISON:  Chest CT 07/10/2019. FINDINGS: Cardiovascular: Heart size is normal. There is no significant pericardial fluid, thickening or pericardial calcification. There is aortic atherosclerosis, as well as atherosclerosis of the great vessels of the mediastinum and the coronary arteries, including calcified atherosclerotic plaque in the left main, left anterior descending, left circumflex and right coronary arteries. Status post median sternotomy for CABG including LIMA to the LAD. Focal penetrating ulcer of the descending thoracic aorta (axial image 93 of series 2) measuring 1.0 x 1.6 cm, minimally increased compared to the prior examination. Calcifications of the aortic valve. Mediastinum/Nodes: New bulky lymphadenopathy in right hilar and right paratracheal nodal stations measuring up to 2.1 cm in short axis in the right hilar region (axial image 65 of series 2) and 1.7 cm in short axis in the right paratracheal nodal station (axial image 49 of series 2). No left hilar lymphadenopathy. Esophagus is unremarkable in appearance. No axillary lymphadenopathy.  Lungs/Pleura: Several new right upper lobe pulmonary nodules are noted on today's examination, largest of which measures 1.5 x 1.1 cm (axial image 100 of series 4). No acute consolidative airspace disease. No pleural effusions. High-resolution images demonstrate no compelling areas of ground-glass attenuation, septal thickening, subpleural reticulation or honeycombing to clearly indicate the presence of interstitial lung disease. There is diffuse bronchial wall thickening with mild centrilobular and paraseptal emphysema, as well as some scattered small thick-walled cysts and micronodules most evident throughout the mid to upper lungs bilaterally. Upper Abdomen: 2.0 x 1.9 cm left adrenal nodule, new compared to the prior examination. Low-attenuation adrenal form thickening of the right adrenal gland, similar to the prior study, likely to reflect adenomatous hyperplasia. Status post cholecystectomy. Aortic atherosclerosis. Musculoskeletal: Median sternotomy wires. There are no aggressive appearing lytic or blastic lesions noted in the visualized portions of the skeleton. IMPRESSION: 1. Again noted is a spectrum of findings suggestive of a smoking related disease such as Langerhans cell histiocytosis. However, today's study also demonstrates the presence of multiple new pulmonary nodules in the right upper lobe, largest of which measures 1.5 x 1.1 cm, associated with bulky right hilar and right paratracheal lymphadenopathy. Findings are highly concerning for primary bronchogenic malignancy. Further evaluation with nonemergent PET-CT is strongly recommended in the near future to better evaluate these findings and assess for distal metastatic disease. 2. New left adrenal nodule measuring 2.0 x 1.9 cm, concerning for potential metastatic lesion. 3. Mild centrilobular and paraseptal emphysema. 4. Aortic atherosclerosis, in addition to left main and 3 vessel coronary artery disease. Status post median sternotomy for CABG  including LIMA to the LAD. 5. There are calcifications of the aortic valve. Echocardiographic correlation for evaluation of potential valvular dysfunction may be warranted if clinically indicated. 6. Additional incidental findings, as above. These results will be called to the ordering clinician or representative by the Radiologist Assistant, and communication documented in the PACS or [employer]. Aortic Atherosclerosis (ICD10-I70.0) and Emphysema (ICD10-J43.9). Electronically Signed   By: Vinnie Langton M.D.   On: 09/11/2021 10:06   DG Chest Port 1 View  Result Date: 09/18/2021 CLINICAL DATA:  Status post bronchoscopy with biopsy. EXAM: PORTABLE CHEST 1 VIEW COMPARISON:  Chest x-ray 07/15/2020.  CT of the chest 09/08/2021. FINDINGS: Patient is status post cardiac surgery. Left-sided pacemaker is again noted. The cardiomediastinal silhouette is within normal limits. There are minimal patchy airspace opacities in the right upper lobe. There is no  pleural effusion or pneumothorax. IMPRESSION: 1. New minimal airspace opacities in the right upper lobe may represent hemorrhage or edema secondary to biopsy. 2. No pneumothorax. Electronically Signed   By: Ronney Asters M.D.   On: 09/18/2021 15:44   DG C-Arm 1-60 Min-No Report  Result Date: 09/18/2021 Fluoroscopy was utilized by the requesting physician.  No radiographic interpretation.   DG C-ARM BRONCHOSCOPY  Result Date: 09/18/2021 C-ARM BRONCHOSCOPY: Fluoroscopy was utilized by the requesting physician.  No radiographic interpretation.    ASSESSMENT: This is a very pleasant 66 years old white female recently diagnosed with extensive stage (T3, N2, M1b) small cell lung cancer presented with multiple nodules in the right upper lobe in addition to right hilar and mediastinal lymphadenopathy and left adrenal gland metastasis pending further staging work-up diagnosed in December 2022.   PLAN: I had a lengthy discussion with the patient today about  her current disease stage, prognosis and treatment options.  I personally and independently reviewed the scan images and discussed the result and showed the images to the patient today. I recommended for the patient to complete the staging work-up by ordering a PET scan as well as CT scan of the head with and without contrast to rule out brain metastasis.  The patient has a defibrillator placed and she will not be able to have MRI of the brain. I discussed with the patient her treatment options and she understands that she has incurable condition and all the treatment will be of palliative nature. She was giving the option of palliative care and hospice referral versus consideration of palliative systemic chemotherapy with carboplatin for AUC of 5 on day 1, etoposide 100 Mg/M2 on days 1, 2 and 3 with Cosela 240 Mg/M2 before the chemotherapy as well as Imfinzi 1500 Mg IV on day 1 every 3 weeks during the course of chemotherapy followed by maintenance Imfinzi 1500 Mg IV every 4 weeks if the patient has no evidence of disease progression after the induction phase. The patient is interested in proceeding with the treatment.  I discussed with her the adverse effect of this treatment including but not limited to alopecia, myelosuppression, nausea and vomiting, peripheral neuropathy, liver or renal dysfunction as well as immunotherapy adverse effects. She is expected to start the first cycle of this treatment on October 16, 2021. The patient will come back for follow-up visit 1 week after her treatment for management of any adverse effect of her treatment. She will have a chemotherapy education class before the first dose of her treatment. I will arrange for the patient to have Port-A-Cath placed by interventional radiology for chemotherapy administration. I will call her pharmacy with prescription for Compazine 10 mg p.o. every 6 hours as needed for nausea in addition to Emla cream to be applied to the  Port-A-Cath site before treatment. The patient was advised to call immediately if she has any other concerning symptoms in the interval.  The patient voices understanding of current disease status and treatment options and is in agreement with the current care plan.  All questions were answered. The patient knows to call the clinic with any problems, questions or concerns. We can certainly see the patient much sooner if necessary.  Thank you so much for allowing me to participate in the care of CARLE DARGAN. I will continue to follow up the patient with you and assist in her care.  The total time spent in the appointment was 90 minutes.  Disclaimer: This note was dictated  with voice recognition software. Similar sounding words can inadvertently be transcribed and may not be corrected upon review.   Eilleen Kempf October 04, 2021, 2:29 PM

## 2021-10-04 NOTE — Progress Notes (Signed)
Oncology Nurse Navigator Documentation  Oncology Nurse Navigator Flowsheets 10/04/2021 09/26/2021  Abnormal Finding Date - 09/11/2021  Confirmed Diagnosis Date - 09/18/2021  Diagnosis Status - Confirmed Diagnosis Complete  Planned Course of Treatment Chemotherapy -  Phase of Treatment Chemo -  Navigator Follow Up Reason: Appointment Review New Patient Appointment  Navigation Complete Date: 10/06/2021 10/04/2021  Navigator Location [facility]  Referral Date to RadOnc/MedOnc - 09/26/2021  Navigator Encounter Type Clinic/MDC;Initial MedOnc Telephone  Telephone - Outgoing Call  Patient Visit Type Initial;MedOnc -  Treatment Phase Pre-Tx/Tx Discussion/I met [name] today during her first visit to see Dr. Julien Nordmann.  She has been dx with extensive stage small cell lung cancer.  Her plan of care is scans then systemic therapy.  I help to educate treatment plan.   Pre-Tx/Tx Discussion  Barriers/Navigation Needs Education Coordination of Care;Education  Education Smoking cessation;Newly Diagnosed Cancer Education;Understanding Cancer/ Treatment Options;Other Other  Interventions Education;Psycho-Social Support Coordination of Care;Psycho-Social Support;Education  Acuity Level 3-Moderate Needs (3-4 Barriers Identified) Level 2-Minimal Needs (1-2 Barriers Identified)  Coordination of Care - Appts  Education Method Written;Verbal Verbal  Time Spent with Patient 45 30

## 2021-10-04 NOTE — Progress Notes (Signed)
START ON PATHWAY REGIMEN - Small Cell Lung     Cycles 1 through 4: A cycle is every 21 days:     Durvalumab      Carboplatin      Etoposide    Cycles 5 and beyond: A cycle is every 28 days:     Durvalumab   **Always confirm dose/schedule in your pharmacy ordering system**  Patient Characteristics: Newly Diagnosed, Preoperative or Nonsurgical Candidate (Clinical Staging), First Line, Extensive Stage Therapeutic Status: Newly Diagnosed, Preoperative or Nonsurgical Candidate (Clinical Staging) AJCC T Category: cT1c AJCC N Category: cN2 AJCC M Category: cM1c AJCC 8 Stage Grouping: IVB Stage Classification: Extensive  Intent of Therapy: Non-Curative / Palliative Intent, Discussed with Patient

## 2021-10-04 NOTE — Patient Instructions (Signed)
Steps to Quit Smoking Smoking tobacco is the leading cause of preventable death. It can affect almost every organ in the body. Smoking puts you and people around you at risk for many serious, long-lasting (chronic) diseases. Quitting smoking can be hard, but it is one of the best things that you can do for your health. It is never too late to quit. How do I get ready to quit? When you decide to quit smoking, make a plan to help you succeed. Before you quit: Pick a date to quit. Set a date within the next 2 weeks to give you time to prepare. Write down the reasons why you are quitting. Keep this list in places where you will see it often. Tell your family, friends, and co-workers that you are quitting. Their support is important. Talk with your doctor about the choices that may help you quit. Find out if your health insurance will pay for these treatments. Know the people, places, things, and activities that make you want to smoke (triggers). Avoid them. What first steps can I take to quit smoking? Throw away all cigarettes at home, at work, and in your car. Throw away the things that you use when you smoke, such as ashtrays and lighters. Clean your car. Make sure to empty the ashtray. Clean your home, including curtains and carpets. What can I do to help me quit smoking? Talk with your doctor about taking medicines and seeing a counselor at the same time. You are more likely to succeed when you do both. If you are pregnant or breastfeeding, talk with your doctor about counseling or other ways to quit smoking. Do not take medicine to help you quit smoking unless your doctor tells you to do so. To quit smoking: Quit right away Quit smoking totally, instead of slowly cutting back on how much you smoke over a period of time. Go to counseling. You are more likely to quit if you go to counseling sessions regularly. Take medicine You may take medicines to help you quit. Some medicines need a  prescription, and some you can buy over-the-counter. Some medicines may contain a drug called nicotine to replace the nicotine in cigarettes. Medicines may: Help you to stop having the desire to smoke (cravings). Help to stop the problems that come when you stop smoking (withdrawal symptoms). Your doctor may ask you to use: Nicotine patches, gum, or lozenges. Nicotine inhalers or sprays. Non-nicotine medicine that is taken by mouth. Find resources Find resources and other ways to help you quit smoking and remain smoke-free after you quit. These resources are most helpful when you use them often. They include: Online chats with a counselor. Phone quitlines. Printed self-help materials. Support groups or group counseling. Text messaging programs. Mobile phone apps. Use apps on your mobile phone or tablet that can help you stick to your quit plan. There are many free apps for mobile phones and tablets as well as websites. Examples include Quit Guide from the CDC and smokefree.gov  What things can I do to make it easier to quit?  Talk to your family and friends. Ask them to support and encourage you. Call a phone quitline (1-800-QUIT-NOW), reach out to support groups, or work with a counselor. Ask people who smoke to not smoke around you. Avoid places that make you want to smoke, such as: Bars. Parties. Smoke-break areas at work. Spend time with people who do not smoke. Lower the stress in your life. Stress can make you want to   smoke. Try these things to help your stress: Getting regular exercise. Doing deep-breathing exercises. Doing yoga. Meditating. Doing a body scan. To do this, close your eyes, focus on one area of your body at a time from head to toe. Notice which parts of your body are tense. Try to relax the muscles in those areas. How will I feel when I quit smoking? Day 1 to 3 weeks Within the first 24 hours, you may start to have some problems that come from quitting tobacco.  These problems are very bad 2-3 days after you quit, but they do not often last for more than 2-3 weeks. You may get these symptoms: Mood swings. Feeling restless, nervous, angry, or annoyed. Trouble concentrating. Dizziness. Strong desire for high-sugar foods and nicotine. Weight gain. Trouble pooping (constipation). Feeling like you may vomit (nausea). Coughing or a sore throat. Changes in how the medicines that you take for other issues work in your body. Depression. Trouble sleeping (insomnia). Week 3 and afterward After the first 2-3 weeks of quitting, you may start to notice more positive results, such as: Better sense of smell and taste. Less coughing and sore throat. Slower heart rate. Lower blood pressure. Clearer skin. Better breathing. Fewer sick days. Quitting smoking can be hard. Do not give up if you fail the first time. Some people need to try a few times before they succeed. Do your best to stick to your quit plan, and talk with your doctor if you have any questions or concerns. Summary Smoking tobacco is the leading cause of preventable death. Quitting smoking can be hard, but it is one of the best things that you can do for your health. When you decide to quit smoking, make a plan to help you succeed. Quit smoking right away, not slowly over a period of time. When you start quitting, seek help from your doctor, family, or friends. This information is not intended to replace advice given to you by your health care provider. Make sure you discuss any questions you have with your health care provider. Document Revised: 05/26/2021 Document Reviewed: 12/06/2018 Elsevier Patient Education  2022 Elsevier Inc.  

## 2021-10-05 ENCOUNTER — Telehealth: Payer: Self-pay

## 2021-10-05 ENCOUNTER — Telehealth: Payer: Self-pay | Admitting: Internal Medicine

## 2021-10-05 NOTE — Telephone Encounter (Signed)
Patient notified of prior authorization approval for  Lidocaine-Prilocaine 2.5% Cream (EMLA). Medication is approved through 01/02/2022.

## 2021-10-05 NOTE — Telephone Encounter (Signed)
Sch per 1/4 , pt aware

## 2021-10-09 NOTE — Progress Notes (Signed)
Pharmacist Chemotherapy Monitoring - Initial Assessment    Anticipated start date: 10/16/21   The following has been reviewed per standard work regarding the patient's treatment regimen: The patient's diagnosis, treatment plan and drug doses, and organ/hematologic function Lab orders and baseline tests specific to treatment regimen  The treatment plan start date, drug sequencing, and pre-medications Prior authorization status  Patient's documented medication list, including drug-drug interaction screen and prescriptions for anti-emetics and supportive care specific to the treatment regimen The drug concentrations, fluid compatibility, administration routes, and timing of the medications to be used The patient's access for treatment and lifetime cumulative dose history, if applicable  The patient's medication allergies and previous infusion related reactions, if applicable   Changes made to treatment plan:  N/A  Follow up needed:  Pending authorization for treatment   Benn Moulder, PharmD Pharmacy Resident  10/09/2021 9:14 AM

## 2021-10-10 ENCOUNTER — Inpatient Hospital Stay: Payer: Medicare Other

## 2021-10-10 ENCOUNTER — Other Ambulatory Visit: Payer: Self-pay

## 2021-10-10 ENCOUNTER — Encounter: Payer: Self-pay | Admitting: Internal Medicine

## 2021-10-10 NOTE — Progress Notes (Signed)
Met with patient at registration to introduce myself as Arboriculturist and to offer available resources.  Discussed one-time $1000 Radio broadcast assistant to assist with personal expenses while going through treatment. Advised what is needed to apply. She will bring ar next visit on 1/16 to complete process.  Gave her my card for any additional financial questions or concerns.

## 2021-10-11 ENCOUNTER — Telehealth: Payer: Self-pay | Admitting: Medical Oncology

## 2021-10-11 NOTE — Telephone Encounter (Signed)
LVM that port a cath procedure is Jan 20 and she is to arrive @12 :30, NPO 8am and driver.

## 2021-10-13 ENCOUNTER — Other Ambulatory Visit: Payer: Self-pay | Admitting: *Deleted

## 2021-10-13 ENCOUNTER — Encounter: Payer: Self-pay | Admitting: *Deleted

## 2021-10-13 DIAGNOSIS — C3490 Malignant neoplasm of unspecified part of unspecified bronchus or lung: Secondary | ICD-10-CM

## 2021-10-13 DIAGNOSIS — I255 Ischemic cardiomyopathy: Secondary | ICD-10-CM

## 2021-10-13 DIAGNOSIS — R569 Unspecified convulsions: Secondary | ICD-10-CM

## 2021-10-13 DIAGNOSIS — I5022 Chronic systolic (congestive) heart failure: Secondary | ICD-10-CM

## 2021-10-13 NOTE — Progress Notes (Signed)
Oncology Nurse Navigator Documentation  Oncology Nurse Navigator Flowsheets 10/13/2021 10/04/2021 09/26/2021  Abnormal Finding Date - - 09/11/2021  Confirmed Diagnosis Date - - 09/18/2021  Diagnosis Status - - Confirmed Diagnosis Complete  Planned Course of Treatment - Chemotherapy -  Phase of Treatment Chemo Chemo -  Chemotherapy Actual Start Date: 10/16/2021 - -  Navigator Follow Up Date: 10/23/2021 - -  Navigator Follow Up Reason: Follow-up Appointment Appointment Review New Patient Appointment  Navigation Complete Date: - 10/06/2021 10/04/2021  Navigator Location [facility]  Referral Date to RadOnc/MedOnc - - 09/26/2021  Navigator Encounter Type Other: Clinic/MDC;Initial MedOnc Telephone  Telephone - - Outgoing Call  Patient Visit Type Other Initial;MedOnc -  Treatment Phase - Pre-Tx/Tx Discussion Pre-Tx/Tx Discussion  Barriers/Navigation Needs Coordination of Care/I followed up on Diane [name]'s tx plan schedule. She had chemo ed, 1st infusion on 1/16, PET and CT head on 1/19, port on 1/20 and a follow up on 1/23.  No other barriers noted at this time.  Education Development worker, international aid  Education - Smoking cessation;Newly Diagnosed Cancer Education;Understanding Cancer/ Treatment Options;Other Other  Interventions Coordination of Care Education;Psycho-Social Support Coordination of Care;Psycho-Social Support;Education  Acuity Level 2-Minimal Needs (1-2 Barriers Identified) Level 3-Moderate Needs (3-4 Barriers Identified) Level 2-Minimal Needs (1-2 Barriers Identified)  Coordination of Care Other - Appts  Education Method - Written;Verbal Verbal  Time Spent with Patient 15 45 30

## 2021-10-16 ENCOUNTER — Ambulatory Visit: Payer: Medicare Other

## 2021-10-16 ENCOUNTER — Inpatient Hospital Stay: Payer: Medicare Other

## 2021-10-16 ENCOUNTER — Other Ambulatory Visit: Payer: Medicare Other

## 2021-10-16 ENCOUNTER — Other Ambulatory Visit: Payer: Self-pay

## 2021-10-16 VITALS — BP 123/65 | HR 94 | Temp 97.7°F | Resp 18 | Wt 130.0 lb

## 2021-10-16 DIAGNOSIS — Z5111 Encounter for antineoplastic chemotherapy: Secondary | ICD-10-CM | POA: Diagnosis not present

## 2021-10-16 DIAGNOSIS — Z79899 Other long term (current) drug therapy: Secondary | ICD-10-CM | POA: Diagnosis not present

## 2021-10-16 DIAGNOSIS — I5022 Chronic systolic (congestive) heart failure: Secondary | ICD-10-CM

## 2021-10-16 DIAGNOSIS — D509 Iron deficiency anemia, unspecified: Secondary | ICD-10-CM | POA: Diagnosis not present

## 2021-10-16 DIAGNOSIS — Z5112 Encounter for antineoplastic immunotherapy: Secondary | ICD-10-CM | POA: Diagnosis not present

## 2021-10-16 DIAGNOSIS — C3411 Malignant neoplasm of upper lobe, right bronchus or lung: Secondary | ICD-10-CM | POA: Diagnosis not present

## 2021-10-16 DIAGNOSIS — C7972 Secondary malignant neoplasm of left adrenal gland: Secondary | ICD-10-CM | POA: Diagnosis not present

## 2021-10-16 DIAGNOSIS — C3490 Malignant neoplasm of unspecified part of unspecified bronchus or lung: Secondary | ICD-10-CM

## 2021-10-16 LAB — CBC WITH DIFFERENTIAL (CANCER CENTER ONLY)
Abs Immature Granulocytes: 0.01 10*3/uL (ref 0.00–0.07)
Basophils Absolute: 0 10*3/uL (ref 0.0–0.1)
Basophils Relative: 1 %
Eosinophils Absolute: 0 10*3/uL (ref 0.0–0.5)
Eosinophils Relative: 0 %
HCT: 30 % — ABNORMAL LOW (ref 36.0–46.0)
Hemoglobin: 9 g/dL — ABNORMAL LOW (ref 12.0–15.0)
Immature Granulocytes: 0 %
Lymphocytes Relative: 14 %
Lymphs Abs: 1 10*3/uL (ref 0.7–4.0)
MCH: 22.6 pg — ABNORMAL LOW (ref 26.0–34.0)
MCHC: 30 g/dL (ref 30.0–36.0)
MCV: 75.4 fL — ABNORMAL LOW (ref 80.0–100.0)
Monocytes Absolute: 0.6 10*3/uL (ref 0.1–1.0)
Monocytes Relative: 9 %
Neutro Abs: 5.5 10*3/uL (ref 1.7–7.7)
Neutrophils Relative %: 76 %
Platelet Count: 284 10*3/uL (ref 150–400)
RBC: 3.98 MIL/uL (ref 3.87–5.11)
RDW: 16.8 % — ABNORMAL HIGH (ref 11.5–15.5)
WBC Count: 7.3 10*3/uL (ref 4.0–10.5)
nRBC: 0 % (ref 0.0–0.2)

## 2021-10-16 LAB — CMP (CANCER CENTER ONLY)
ALT: 12 U/L (ref 0–44)
AST: 14 U/L — ABNORMAL LOW (ref 15–41)
Albumin: 4.3 g/dL (ref 3.5–5.0)
Alkaline Phosphatase: 75 U/L (ref 38–126)
Anion gap: 11 (ref 5–15)
BUN: 26 mg/dL — ABNORMAL HIGH (ref 8–23)
CO2: 28 mmol/L (ref 22–32)
Calcium: 9.6 mg/dL (ref 8.9–10.3)
Chloride: 90 mmol/L — ABNORMAL LOW (ref 98–111)
Creatinine: 0.99 mg/dL (ref 0.44–1.00)
GFR, Estimated: >60 mL/min (ref >60)
Glucose, Bld: 179 mg/dL — ABNORMAL HIGH (ref 70–99)
Potassium: 3.4 mmol/L — ABNORMAL LOW (ref 3.5–5.1)
Sodium: 129 mmol/L — ABNORMAL LOW (ref 135–145)
Total Bilirubin: 0.4 mg/dL (ref 0.3–1.2)
Total Protein: 7.5 g/dL (ref 6.5–8.1)

## 2021-10-16 LAB — DIGOXIN LEVEL: Digoxin Level: 0.7 ng/mL — ABNORMAL LOW (ref 0.8–2.0)

## 2021-10-16 MED ORDER — SODIUM CHLORIDE 0.9 % IV SOLN
10.0000 mg | Freq: Once | INTRAVENOUS | Status: AC
  Administered 2021-10-16: 10 mg via INTRAVENOUS
  Filled 2021-10-16: qty 10

## 2021-10-16 MED ORDER — PALONOSETRON HCL INJECTION 0.25 MG/5ML
0.2500 mg | Freq: Once | INTRAVENOUS | Status: AC
  Administered 2021-10-16: 0.25 mg via INTRAVENOUS
  Filled 2021-10-16: qty 5

## 2021-10-16 MED ORDER — TRILACICLIB DIHYDROCHLORIDE INJECTION 300 MG
240.0000 mg/m2 | Freq: Once | INTRAVENOUS | Status: AC
  Administered 2021-10-16: 375 mg via INTRAVENOUS
  Filled 2021-10-16: qty 25

## 2021-10-16 MED ORDER — SODIUM CHLORIDE 0.9 % IV SOLN
1500.0000 mg | Freq: Once | INTRAVENOUS | Status: AC
  Administered 2021-10-16: 1500 mg via INTRAVENOUS
  Filled 2021-10-16: qty 30

## 2021-10-16 MED ORDER — SODIUM CHLORIDE 0.9 % IV SOLN: Freq: Once | INTRAVENOUS | Status: AC

## 2021-10-16 MED ORDER — SODIUM CHLORIDE 0.9 % IV SOLN
100.0000 mg/m2 | Freq: Once | INTRAVENOUS | Status: AC
  Administered 2021-10-16: 160 mg via INTRAVENOUS
  Filled 2021-10-16: qty 8

## 2021-10-16 MED ORDER — SODIUM CHLORIDE 0.9 % IV SOLN
150.0000 mg | Freq: Once | INTRAVENOUS | Status: AC
  Administered 2021-10-16: 150 mg via INTRAVENOUS
  Filled 2021-10-16: qty 150

## 2021-10-16 MED ORDER — SODIUM CHLORIDE 0.9 % IV SOLN
382.5000 mg | Freq: Once | INTRAVENOUS | Status: AC
  Administered 2021-10-16: 380 mg via INTRAVENOUS
  Filled 2021-10-16: qty 38

## 2021-10-16 MED FILL — Dexamethasone Sodium Phosphate Inj 100 MG/10ML: INTRAMUSCULAR | Qty: 1 | Status: AC

## 2021-10-16 NOTE — Patient Instructions (Signed)
[facility]  Discharge Instructions: Thank you for choosing [facility] to provide your oncology and hematology care.   If you have a lab appointment with the [facility], please go directly to the [facility] and check in at the registration area.   Wear comfortable clothing and clothing appropriate for easy access to any Portacath or PICC line.   We strive to give you quality time with your provider. You may need to reschedule your appointment if you arrive late (15 or more minutes).  Arriving late affects you and other patients whose appointments are after yours.  Also, if you miss three or more appointments without notifying the office, you may be dismissed from the clinic at the providers discretion.      For prescription refill requests, have your pharmacy contact our office and allow 72 hours for refills to be completed.    Today you received the following chemotherapy and/or immunotherapy agents Carboplatin, Etoposide, Imfinzi      To help prevent nausea and vomiting after your treatment, we encourage you to take your nausea medication as directed.  BELOW ARE SYMPTOMS THAT SHOULD BE REPORTED IMMEDIATELY: *FEVER GREATER THAN 100.4 F (38 C) OR HIGHER *CHILLS OR SWEATING *NAUSEA AND VOMITING THAT IS NOT CONTROLLED WITH YOUR NAUSEA MEDICATION *UNUSUAL SHORTNESS OF BREATH *UNUSUAL BRUISING OR BLEEDING *URINARY PROBLEMS (pain or burning when urinating, or frequent urination) *BOWEL PROBLEMS (unusual diarrhea, constipation, pain near the anus) TENDERNESS IN MOUTH AND THROAT WITH OR WITHOUT PRESENCE OF ULCERS (sore throat, sores in mouth, or a toothache) UNUSUAL RASH, SWELLING OR PAIN  UNUSUAL VAGINAL DISCHARGE OR ITCHING   Items with * indicate a potential emergency and should be followed up as soon as possible or go to the Emergency Department if any problems should occur.  Please show the CHEMOTHERAPY ALERT CARD or IMMUNOTHERAPY ALERT  CARD at check-in to the Emergency Department and triage nurse.  Should you have questions after your visit or need to cancel or reschedule your appointment, please contact [facility]  Dept: [phone]  and follow the prompts.  Office hours are 8:00 a.m. to 4:30 p.m. Monday - Friday. Please note that voicemails left after 4:00 p.m. may not be returned until the following business day.  We are closed weekends and major holidays. You have access to a nurse at all times for urgent questions. Please call the main number to the clinic Dept: [phone] and follow the prompts.   For any non-urgent questions, you may also contact your provider using MyChart. We now offer e-Visits for anyone 53 and older to request care online for non-urgent symptoms. For details visit [email or web address].   Also download the MyChart app! Go to the app store, search "MyChart", open the app, select [facility], and log in with your MyChart username and password.  Due to Covid, a mask is required upon entering the hospital/clinic. If you do not have a mask, one will be given to you upon arrival. For doctor visits, patients may have 1 support person aged 61 or older with them. For treatment visits, patients cannot have anyone with them due to current Covid guidelines and our immunocompromised population.

## 2021-10-17 ENCOUNTER — Inpatient Hospital Stay: Payer: Medicare Other

## 2021-10-17 VITALS — BP 98/53 | HR 93 | Temp 98.2°F | Resp 18

## 2021-10-17 DIAGNOSIS — Z79899 Other long term (current) drug therapy: Secondary | ICD-10-CM | POA: Diagnosis not present

## 2021-10-17 DIAGNOSIS — Z5111 Encounter for antineoplastic chemotherapy: Secondary | ICD-10-CM | POA: Diagnosis not present

## 2021-10-17 DIAGNOSIS — C3411 Malignant neoplasm of upper lobe, right bronchus or lung: Secondary | ICD-10-CM | POA: Diagnosis not present

## 2021-10-17 DIAGNOSIS — C7972 Secondary malignant neoplasm of left adrenal gland: Secondary | ICD-10-CM | POA: Diagnosis not present

## 2021-10-17 DIAGNOSIS — D509 Iron deficiency anemia, unspecified: Secondary | ICD-10-CM | POA: Diagnosis not present

## 2021-10-17 DIAGNOSIS — Z5112 Encounter for antineoplastic immunotherapy: Secondary | ICD-10-CM | POA: Diagnosis not present

## 2021-10-17 DIAGNOSIS — C3490 Malignant neoplasm of unspecified part of unspecified bronchus or lung: Secondary | ICD-10-CM

## 2021-10-17 MED ORDER — SODIUM CHLORIDE 0.9 % IV SOLN
10.0000 mg | Freq: Once | INTRAVENOUS | Status: AC
  Administered 2021-10-17: 10 mg via INTRAVENOUS
  Filled 2021-10-17: qty 10

## 2021-10-17 MED ORDER — TRILACICLIB DIHYDROCHLORIDE INJECTION 300 MG
240.0000 mg/m2 | Freq: Once | INTRAVENOUS | Status: AC
  Administered 2021-10-17: 375 mg via INTRAVENOUS
  Filled 2021-10-17: qty 25

## 2021-10-17 MED ORDER — SODIUM CHLORIDE 0.9 % IV SOLN: Freq: Once | INTRAVENOUS | Status: AC

## 2021-10-17 MED ORDER — SODIUM CHLORIDE 0.9 % IV SOLN
100.0000 mg/m2 | Freq: Once | INTRAVENOUS | Status: AC
  Administered 2021-10-17: 160 mg via INTRAVENOUS
  Filled 2021-10-17: qty 8

## 2021-10-17 NOTE — Patient Instructions (Signed)
[facility]  Discharge Instructions: Thank you for choosing [facility] to provide your oncology and hematology care.   If you have a lab appointment with the Cancer Center, please go directly to the Cancer Center and check in at the registration area.   Wear comfortable clothing and clothing appropriate for easy access to any Portacath or PICC line.   We strive to give you quality time with your provider. You may need to reschedule your appointment if you arrive late (15 or more minutes).  Arriving late affects you and other patients whose appointments are after yours.  Also, if you miss three or more appointments without notifying the office, you may be dismissed from the clinic at the provider's discretion.      For prescription refill requests, have your pharmacy contact our office and allow 72 hours for refills to be completed.    Today you received the following chemotherapy and/or immunotherapy agents: etoposide.     To help prevent nausea and vomiting after your treatment, we encourage you to take your nausea medication as directed.  BELOW ARE SYMPTOMS THAT SHOULD BE REPORTED IMMEDIATELY: . *FEVER GREATER THAN 100.4 F (38 C) OR HIGHER . *CHILLS OR SWEATING . *NAUSEA AND VOMITING THAT IS NOT CONTROLLED WITH YOUR NAUSEA MEDICATION . *UNUSUAL SHORTNESS OF BREATH . *UNUSUAL BRUISING OR BLEEDING . *URINARY PROBLEMS (pain or burning when urinating, or frequent urination) . *BOWEL PROBLEMS (unusual diarrhea, constipation, pain near the anus) . TENDERNESS IN MOUTH AND THROAT WITH OR WITHOUT PRESENCE OF ULCERS (sore throat, sores in mouth, or a toothache) . UNUSUAL RASH, SWELLING OR PAIN  . UNUSUAL VAGINAL DISCHARGE OR ITCHING   Items with * indicate a potential emergency and should be followed up as soon as possible or go to the Emergency Department if any problems should occur.  Please show the CHEMOTHERAPY ALERT CARD or IMMUNOTHERAPY ALERT  CARD at check-in to the Emergency Department and triage nurse.  Should you have questions after your visit or need to cancel or reschedule your appointment, please contact [facility]  Dept: [phone]  and follow the prompts.  Office hours are 8:00 a.m. to 4:30 p.m. Monday - Friday. Please note that voicemails left after 4:00 p.m. may not be returned until the following business day.  We are closed weekends and major holidays. You have access to a nurse at all times for urgent questions. Please call the main number to the clinic Dept: [phone] and follow the prompts.   For any non-urgent questions, you may also contact your provider using MyChart. We now offer e-Visits for anyone 18 and older to request care online for non-urgent symptoms. For details visit [email or web address].   Also download the MyChart app! Go to the app store, search "MyChart", open the app, select [facility], and log in with your MyChart username and password.  Due to Covid, a mask is required upon entering the hospital/clinic. If you do not have a mask, one will be given to you upon arrival. For doctor visits, patients may have 1 support person aged 18 or older with them. For treatment visits, patients cannot have anyone with them due to current Covid guidelines and our immunocompromised population.   

## 2021-10-18 ENCOUNTER — Inpatient Hospital Stay: Payer: Medicare Other

## 2021-10-18 ENCOUNTER — Other Ambulatory Visit: Payer: Self-pay | Admitting: Radiology

## 2021-10-18 ENCOUNTER — Other Ambulatory Visit: Payer: Self-pay

## 2021-10-18 VITALS — BP 124/64 | HR 90 | Temp 97.6°F | Resp 18

## 2021-10-18 DIAGNOSIS — C7972 Secondary malignant neoplasm of left adrenal gland: Secondary | ICD-10-CM | POA: Diagnosis not present

## 2021-10-18 DIAGNOSIS — D509 Iron deficiency anemia, unspecified: Secondary | ICD-10-CM | POA: Diagnosis not present

## 2021-10-18 DIAGNOSIS — Z79899 Other long term (current) drug therapy: Secondary | ICD-10-CM | POA: Diagnosis not present

## 2021-10-18 DIAGNOSIS — C3490 Malignant neoplasm of unspecified part of unspecified bronchus or lung: Secondary | ICD-10-CM

## 2021-10-18 DIAGNOSIS — Z5111 Encounter for antineoplastic chemotherapy: Secondary | ICD-10-CM | POA: Diagnosis not present

## 2021-10-18 DIAGNOSIS — Z5112 Encounter for antineoplastic immunotherapy: Secondary | ICD-10-CM | POA: Diagnosis not present

## 2021-10-18 DIAGNOSIS — C3411 Malignant neoplasm of upper lobe, right bronchus or lung: Secondary | ICD-10-CM | POA: Diagnosis not present

## 2021-10-18 MED ORDER — SODIUM CHLORIDE 0.9 % IV SOLN: Freq: Once | INTRAVENOUS | Status: AC

## 2021-10-18 MED ORDER — SODIUM CHLORIDE 0.9 % IV SOLN
10.0000 mg | Freq: Once | INTRAVENOUS | Status: AC
  Administered 2021-10-18: 10 mg via INTRAVENOUS
  Filled 2021-10-18: qty 10

## 2021-10-18 MED ORDER — TRILACICLIB DIHYDROCHLORIDE INJECTION 300 MG
240.0000 mg/m2 | Freq: Once | INTRAVENOUS | Status: AC
  Administered 2021-10-18: 375 mg via INTRAVENOUS
  Filled 2021-10-18: qty 25

## 2021-10-18 MED ORDER — SODIUM CHLORIDE 0.9 % IV SOLN
100.0000 mg/m2 | Freq: Once | INTRAVENOUS | Status: AC
  Administered 2021-10-18: 160 mg via INTRAVENOUS
  Filled 2021-10-18: qty 8

## 2021-10-18 NOTE — Patient Instructions (Signed)
[facility]   Discharge Instructions: Thank you for choosing [facility] to provide your oncology and hematology care.   If you have a lab appointment with the [facility], please go directly to the [facility] and check in at the registration area.   Wear comfortable clothing and clothing appropriate for easy access to any Portacath or PICC line.   We strive to give you quality time with your provider. You may need to reschedule your appointment if you arrive late (15 or more minutes).  Arriving late affects you and other patients whose appointments are after yours.  Also, if you miss three or more appointments without notifying the office, you may be dismissed from the clinic at the providers discretion.      For prescription refill requests, have your pharmacy contact our office and allow 72 hours for refills to be completed.    Today you received the following chemotherapy and/or immunotherapy agents: Etoposide       To help prevent nausea and vomiting after your treatment, we encourage you to take your nausea medication as directed.  BELOW ARE SYMPTOMS THAT SHOULD BE REPORTED IMMEDIATELY: *FEVER GREATER THAN 100.4 F (38 C) OR HIGHER *CHILLS OR SWEATING *NAUSEA AND VOMITING THAT IS NOT CONTROLLED WITH YOUR NAUSEA MEDICATION *UNUSUAL SHORTNESS OF BREATH *UNUSUAL BRUISING OR BLEEDING *URINARY PROBLEMS (pain or burning when urinating, or frequent urination) *BOWEL PROBLEMS (unusual diarrhea, constipation, pain near the anus) TENDERNESS IN MOUTH AND THROAT WITH OR WITHOUT PRESENCE OF ULCERS (sore throat, sores in mouth, or a toothache) UNUSUAL RASH, SWELLING OR PAIN  UNUSUAL VAGINAL DISCHARGE OR ITCHING   Items with * indicate a potential emergency and should be followed up as soon as possible or go to the Emergency Department if any problems should occur.  Please show the CHEMOTHERAPY ALERT CARD or IMMUNOTHERAPY ALERT CARD at check-in  to the Emergency Department and triage nurse.  Should you have questions after your visit or need to cancel or reschedule your appointment, please contact [facility]  Dept: [phone]  and follow the prompts.  Office hours are 8:00 a.m. to 4:30 p.m. Monday - Friday. Please note that voicemails left after 4:00 p.m. may not be returned until the following business day.  We are closed weekends and major holidays. You have access to a nurse at all times for urgent questions. Please call the main number to the clinic Dept: [phone] and follow the prompts.   For any non-urgent questions, you may also contact your provider using MyChart. We now offer e-Visits for anyone 23 and older to request care online for non-urgent symptoms. For details visit [email or web address].   Also download the MyChart app! Go to the app store, search "MyChart", open the app, select [facility], and log in with your MyChart username and password.  Due to Covid, a mask is required upon entering the hospital/clinic. If you do not have a mask, one will be given to you upon arrival. For doctor visits, patients may have 1 support person aged 72 or older with them. For treatment visits, patients cannot have anyone with them due to current Covid guidelines and our immunocompromised population.

## 2021-10-19 ENCOUNTER — Other Ambulatory Visit (HOSPITAL_COMMUNITY): Payer: Self-pay | Admitting: Physician Assistant

## 2021-10-19 ENCOUNTER — Other Ambulatory Visit: Payer: Self-pay | Admitting: Radiology

## 2021-10-19 ENCOUNTER — Ambulatory Visit (HOSPITAL_COMMUNITY)
Admission: RE | Admit: 2021-10-19 | Discharge: 2021-10-19 | Disposition: A | Discharge status: Home / Self Care | Payer: Medicare Other | Source: Ambulatory Visit | Attending: Internal Medicine | Admitting: Internal Medicine

## 2021-10-19 DIAGNOSIS — S2243XA Multiple fractures of ribs, bilateral, initial encounter for closed fracture: Secondary | ICD-10-CM | POA: Diagnosis not present

## 2021-10-19 DIAGNOSIS — C3490 Malignant neoplasm of unspecified part of unspecified bronchus or lung: Secondary | ICD-10-CM

## 2021-10-19 DIAGNOSIS — R569 Unspecified convulsions: Secondary | ICD-10-CM | POA: Diagnosis not present

## 2021-10-19 DIAGNOSIS — I2510 Atherosclerotic heart disease of native coronary artery without angina pectoris: Secondary | ICD-10-CM | POA: Diagnosis not present

## 2021-10-19 DIAGNOSIS — Z85118 Personal history of other malignant neoplasm of bronchus and lung: Secondary | ICD-10-CM | POA: Diagnosis not present

## 2021-10-19 DIAGNOSIS — R590 Localized enlarged lymph nodes: Secondary | ICD-10-CM | POA: Insufficient documentation

## 2021-10-19 DIAGNOSIS — N200 Calculus of kidney: Secondary | ICD-10-CM | POA: Diagnosis not present

## 2021-10-19 LAB — GLUCOSE, CAPILLARY: Glucose-Capillary: 99 mg/dL (ref 70–99)

## 2021-10-19 MED ORDER — SODIUM CHLORIDE (PF) 0.9 % IJ SOLN
INTRAMUSCULAR | Status: AC
  Filled 2021-10-19: qty 50

## 2021-10-19 MED ORDER — FLUDEOXYGLUCOSE F - 18 (FDG) INJECTION
6.8000 | Freq: Once | INTRAVENOUS | Status: AC
  Administered 2021-10-19: 7.2 via INTRAVENOUS

## 2021-10-19 MED ORDER — IOHEXOL 300 MG/ML  SOLN
75.0000 mL | Freq: Once | INTRAMUSCULAR | Status: AC | PRN
  Administered 2021-10-19: 75 mL via INTRAVENOUS

## 2021-10-20 ENCOUNTER — Encounter (HOSPITAL_COMMUNITY): Payer: Self-pay

## 2021-10-20 ENCOUNTER — Other Ambulatory Visit: Payer: Self-pay

## 2021-10-20 ENCOUNTER — Ambulatory Visit (HOSPITAL_COMMUNITY)
Admission: RE | Admit: 2021-10-20 | Discharge: 2021-10-20 | Disposition: A | Discharge status: Home / Self Care | Payer: Medicare Other | Source: Ambulatory Visit | Attending: Internal Medicine | Admitting: Internal Medicine

## 2021-10-20 ENCOUNTER — Other Ambulatory Visit: Payer: Self-pay | Admitting: Internal Medicine

## 2021-10-20 DIAGNOSIS — I6521 Occlusion and stenosis of right carotid artery: Secondary | ICD-10-CM | POA: Diagnosis not present

## 2021-10-20 DIAGNOSIS — I110 Hypertensive heart disease with heart failure: Secondary | ICD-10-CM | POA: Insufficient documentation

## 2021-10-20 DIAGNOSIS — I509 Heart failure, unspecified: Secondary | ICD-10-CM | POA: Insufficient documentation

## 2021-10-20 DIAGNOSIS — E119 Type 2 diabetes mellitus without complications: Secondary | ICD-10-CM | POA: Insufficient documentation

## 2021-10-20 DIAGNOSIS — C3490 Malignant neoplasm of unspecified part of unspecified bronchus or lung: Secondary | ICD-10-CM | POA: Diagnosis not present

## 2021-10-20 DIAGNOSIS — J449 Chronic obstructive pulmonary disease, unspecified: Secondary | ICD-10-CM | POA: Insufficient documentation

## 2021-10-20 DIAGNOSIS — Z951 Presence of aortocoronary bypass graft: Secondary | ICD-10-CM | POA: Diagnosis not present

## 2021-10-20 DIAGNOSIS — I2510 Atherosclerotic heart disease of native coronary artery without angina pectoris: Secondary | ICD-10-CM | POA: Diagnosis not present

## 2021-10-20 DIAGNOSIS — C7972 Secondary malignant neoplasm of left adrenal gland: Secondary | ICD-10-CM | POA: Insufficient documentation

## 2021-10-20 DIAGNOSIS — Z452 Encounter for adjustment and management of vascular access device: Secondary | ICD-10-CM | POA: Diagnosis not present

## 2021-10-20 DIAGNOSIS — E785 Hyperlipidemia, unspecified: Secondary | ICD-10-CM | POA: Insufficient documentation

## 2021-10-20 HISTORY — PX: IR IMAGING GUIDED PORT INSERTION: IMG5740

## 2021-10-20 LAB — GLUCOSE, CAPILLARY: Glucose-Capillary: 214 mg/dL — ABNORMAL HIGH (ref 70–99)

## 2021-10-20 MED ORDER — MIDAZOLAM HCL 2 MG/2ML IJ SOLN
INTRAMUSCULAR | Status: AC
  Filled 2021-10-20: qty 2

## 2021-10-20 MED ORDER — HEPARIN SOD (PORK) LOCK FLUSH 100 UNIT/ML IV SOLN
INTRAVENOUS | Status: AC
  Filled 2021-10-20: qty 5

## 2021-10-20 MED ORDER — LIDOCAINE-EPINEPHRINE 1 %-1:100000 IJ SOLN
INTRAMUSCULAR | Status: AC
  Filled 2021-10-20: qty 1

## 2021-10-20 MED ORDER — SODIUM CHLORIDE 0.9 % IV SOLN: INTRAVENOUS | Status: DC

## 2021-10-20 MED ORDER — LIDOCAINE-EPINEPHRINE (PF) 1 %-1:200000 IJ SOLN
INTRAMUSCULAR | Status: AC | PRN
  Administered 2021-10-20: 20 mL

## 2021-10-20 MED ORDER — MIDAZOLAM HCL 2 MG/2ML IJ SOLN
INTRAMUSCULAR | Status: AC | PRN
  Administered 2021-10-20: 1 mg via INTRAVENOUS

## 2021-10-20 MED ORDER — FENTANYL CITRATE (PF) 100 MCG/2ML IJ SOLN
INTRAMUSCULAR | Status: AC | PRN
  Administered 2021-10-20: 50 ug via INTRAVENOUS

## 2021-10-20 MED ORDER — HEPARIN SOD (PORK) LOCK FLUSH 100 UNIT/ML IV SOLN
INTRAVENOUS | Status: AC | PRN
  Administered 2021-10-20: 500 [IU] via INTRAVENOUS

## 2021-10-20 MED ORDER — FENTANYL CITRATE (PF) 100 MCG/2ML IJ SOLN
INTRAMUSCULAR | Status: AC
  Filled 2021-10-20: qty 2

## 2021-10-20 NOTE — Discharge Instructions (Signed)
Interventional radiology phone numbers °[phone] °After hours [phone] ° ° ° °You have skin glue (dermabond) over your new port. Do not use the lidocaine cream (EMLA cream) over the skin glue until it has healed. The petroleum in the lidocaine cream will dissolve the skin glue resulting in an infection of your new port. Use ice in a zip lock bag for 1-2 minutes over your new port before the cancer center nurses access your port. ° ° °Implanted Port Insertion, Care After °This sheet gives you information about how to care for yourself after your procedure. Your health care provider may also give you more specific instructions. If you have problems or questions, contact your health care provider. °What can I expect after the procedure? °After the procedure, it is common to have: °Discomfort at the port insertion site. °Bruising on the skin over the port. This should improve over 3-4 days. °Follow these instructions at home: °Port care °After your port is placed, you will get a manufacturer's information card. The card has information about your port. Keep this card with you at all times. °Take care of the port as told by your health care provider. Ask your health care provider if you or a family member can get training for taking care of the port at home. A home health care nurse may also take care of the port. °Make sure to remember what type of port you have. °Incision care °Follow instructions from your health care provider about how to take care of your port insertion site. Make sure you: °Wash your hands with soap and water before and after you change your bandage (dressing). If soap and water are not available, use hand sanitizer. °Change your dressing as told by your health care provider. °Leave skin glue in place. These skin closures may need to stay in place for 2 weeks or longer.  °Check your port insertion site every day for signs of infection. Check for: °Redness, swelling, or pain. °Fluid or  blood. °Warmth. °Pus or a bad smell.  °  °  °Activity °Return to your normal activities as told by your health care provider. Ask your health care provider what activities are safe for you. °Do not lift anything that is heavier than 10 lb (4.5 kg), or the limit that you are told, until your health care provider says that it is safe. °General instructions °Take over-the-counter and prescription medicines only as told by your health care provider. °Do not take baths, swim, or use a hot tub until your health care provider approves.You may remove your dressing tomorrow and shower 24 hours after your procedure. °Do not drive for 24 hours if you were given a sedative during your procedure. °Wear a medical alert bracelet in case of an emergency. This will tell any health care providers that you have a port. °Keep all follow-up visits as told by your health care provider. This is important. °Contact a health care provider if: °You cannot flush your port with saline as directed, or you cannot draw blood from the port. °You have a fever or chills. °You have redness, swelling, or pain around your port insertion site. °You have fluid or blood coming from your port insertion site. °Your port insertion site feels warm to the touch. °You have pus or a bad smell coming from the port insertion site. °Get help right away if: °You have chest pain or shortness of breath. °You have bleeding from your port that you cannot control. °Summary °Take care of   the port as told by your health care provider. Keep the manufacturer's information card with you at all times. °Change your dressing as told by your health care provider. °Contact a health care provider if you have a fever or chills or if you have redness, swelling, or pain around your port insertion site. °Keep all follow-up visits as told by your health care provider. °This information is not intended to replace advice given to you by your health care provider. Make sure you discuss any  questions you have with your health care provider. °Document Revised: 04/15/2018 Document Reviewed: 04/15/2018 °[employer] Patient Education © 2021 [employer] ° ° ° °Moderate Conscious Sedation, Adult, Care After °This sheet gives you information about how to care for yourself after your procedure. Your health care provider may also give you more specific instructions. If you have problems or questions, contact your health care provider. °What can I expect after the procedure? °After the procedure, it is common to have: °Sleepiness for several hours. °Impaired judgment for several hours. °Difficulty with balance. °Vomiting if you eat too soon. °Follow these instructions at home: °For the time period you were told by your health care provider: °Rest. °Do not participate in activities where you could fall or become injured. °Do not drive or use machinery. °Do not drink alcohol. °Do not take sleeping pills or medicines that cause drowsiness. °Do not make important decisions or sign legal documents. °Do not take care of children on your own.  °  °  °Eating and drinking °Follow the diet recommended by your health care provider. °Drink enough fluid to keep your urine pale yellow. °If you vomit: °Drink water, juice, or soup when you can drink without vomiting. °Make sure you have little or no nausea before eating solid foods.   °General instructions °Take over-the-counter and prescription medicines only as told by your health care provider. °Have a responsible adult stay with you for the time you are told. It is important to have someone help care for you until you are awake and alert. °Do not smoke. °Keep all follow-up visits as told by your health care provider. This is important. °Contact a health care provider if: °You are still sleepy or having trouble with balance after 24 hours. °You feel light-headed. °You keep feeling nauseous or you keep vomiting. °You develop a rash. °You have a fever. °You have redness or  swelling around the IV site. °Get help right away if: °You have trouble breathing. °You have new-onset confusion at home. °Summary °After the procedure, it is common to feel sleepy, have impaired judgment, or feel nauseous if you eat too soon. °Rest after you get home. Know the things you should not do after the procedure. °Follow the diet recommended by your health care provider and drink enough fluid to keep your urine pale yellow. °Get help right away if you have trouble breathing or new-onset confusion at home. °This information is not intended to replace advice given to you by your health care provider. Make sure you discuss any questions you have with your health care provider. °Document Revised: 01/15/2020 Document Reviewed: 08/13/2019 °[employer] Patient Education © 2021 [employer]  °

## 2021-10-20 NOTE — Consult Note (Signed)
Chief Complaint: Patient was seen in consultation today for Port-A-Cath placement  Referring Physician(s): Mohamed,Mohamed  Supervising Physician: Ruthann Cancer  Patient Status: [facility] - Out-pt  History of Present Illness: Diane [name] is a 66 y.o. female smoker with hx recently diagnosed extensive stage (T3, N2, M1b) small cell lung cancer who presented with multiple nodules in the right upper lobe in addition to right hilar and mediastinal lymphadenopathy and left adrenal gland metastasis pending further staging work-up diagnosed in December 2022.  Additional medical history includes carotid stenosis with prior right carotid stent, CHF, COPD, coronary artery disease with prior CABG, depression, diabetes, hyperlipidemia, fatty liver, fibromyalgia, GERD, nephrolithiasis, hypertension, seizures, sleep apnea, prior cerebral infarcts.  She presents today for Port-A-Cath placement to assist with additional treatment.   Past Medical History:  Diagnosis Date   Anemia    Arthritis    Carotid stenosis    a. s/p right carotid stent 10/2017.   Chronic systolic CHF (congestive heart failure) (HCC)    Colon polyps    COPD (chronic obstructive pulmonary disease) (HCC)    Coronary artery disease    post CABG in 3/07 , coronary stents    Depression    after major stroke years ago, no longer treated   Diabetes mellitus    type 2   Dyslipidemia    Fatty liver    Fibromyalgia    GERD (gastroesophageal reflux disease)    Headache    hx of    History of kidney stones    Hyperlipidemia    Hypertension    Myocardial infarction ([place])    Pneumonia    hx of    Seizures ([place])    last seizure- 03/2013    Shortness of breath dyspnea    with exertion or when fluid builds up    Sleep apnea    used to wear a cpap- not used in 3 years    Status post dilation of esophageal narrowing    Stroke [facility]) 1993   problems with balance    Systolic murmur    known mild AS and MR   Thyroid goiter     Tobacco abuse     Past Surgical History:  Procedure Laterality Date   ABDOMINAL AORTOGRAM W/LOWER EXTREMITY N/A 04/18/2020   Procedure: ABDOMINAL AORTOGRAM W/LOWER EXTREMITY;  Surgeon: Lorretta Harp, MD;  Location: [place] [facility];  Service: Cardiovascular;  Laterality: N/A;   APPENDECTOMY     BIOPSY  02/03/2019   Procedure: BIOPSY;  Surgeon: Ladene Artist, MD;  Location: [facility] ENDOS[facility];  Service: Endoscopy;;   BREAST BIOPSY Left 2016   BRONCHIAL BIOPSY  09/18/2021   Procedure: BRONCHIAL BIOPSIES;  Surgeon: Collene Gobble, MD;  Location: [facility] ENDOSCOPY;  Service: Pulmonary;;   BRONCHIAL BRUSHINGS  09/18/2021   Procedure: BRONCHIAL BRUSHINGS;  Surgeon: Collene Gobble, MD;  Location: [facility] ENDOSCOPY;  Service: Pulmonary;;   BRONCHIAL NEEDLE ASPIRATION BIOPSY  09/18/2021   Procedure: BRONCHIAL NEEDLE ASPIRATION BIOPSIES;  Surgeon: Collene Gobble, MD;  Location: [facility] ENDOSCOPY;  Service: Pulmonary;;   CARDIAC CATHETERIZATION  11/29/05   EF of 55%   CARDIAC CATHETERIZATION  08/06/06   EF of 45-50%   CARDIAC CATHETERIZATION N/A 05/06/2015   Procedure: Right/Left Heart Cath and Coronary/Graft Angiography;  Surgeon: Peter M Martinique, MD;  Location: [place] [facility];  Service: Cardiovascular;  Laterality: N/A;   CAROTID PTA/STENT INTERVENTION N/A 10/10/2017   Procedure: CAROTID PTA/STENT INTERVENTION - Right;  Surgeon: Serafina Mitchell, MD;  Location: [place] [facility] LAB;  Service: Cardiovascular;  Laterality: N/A;   CERVICAL FUSION  1990   CHOLECYSTECTOMY     COLON RESECTION     mass removed and 4 in of colon   COLONOSCOPY WITH PROPOFOL N/A 07/16/2017   Procedure: COLONOSCOPY WITH PROPOFOL;  Surgeon: Ladene Artist, MD;  Location: [facility];  Service: Endoscopy;  Laterality: N/A;   CORONARY ARTERY BYPASS GRAFT  12/04/2005   x5 -- left internal mammary artery to the LAD, left radial artery to the ramus intermedius, saphenous vein graft to the obtuse marginal 1, sequential saphenous vein grat to  the acute marginal and posterior descending, endoscopic vein harvesting from the left thigh with open vein harvest from right leg   CORONARY STENT PLACEMENT  08/11/06   PCI of her ciurcumflex/OM vessel   ESOPHAGOGASTRODUODENOSCOPY (EGD) WITH PROPOFOL N/A 02/03/2019   Procedure: ESOPHAGOGASTRODUODENOSCOPY (EGD) WITH PROPOFOL;  Surgeon: Ladene Artist, MD;  Location: [facility];  Service: Endoscopy;  Laterality: N/A;   FINE NEEDLE ASPIRATION  09/18/2021   Procedure: FINE NEEDLE ASPIRATION (FNA) LINEAR;  Surgeon: Collene Gobble, MD;  Location: [facility] ENDOSCOPY;  Service: Pulmonary;;   ICD IMPLANT N/A 07/15/2020   Procedure: ICD IMPLANT;  Surgeon: Vickie Epley, MD;  Location: [place] [facility] LAB;  Service: Cardiovascular;  Laterality: N/A;   LAPAROTOMY Bilateral 05/19/2015   Procedure: EXPLORATORY LAPAROTOMY WITH BILATERAL SALPINGO OOPHORECTOMY /OMENTECTOMY/SEGMENTAL SIGMOID COLECTOMY ;  Surgeon: Everitt Amber, MD;  Location: [facility];  Service: Gynecology;  Laterality: Bilateral;   PERIPHERAL VASCULAR BALLOON ANGIOPLASTY  04/18/2020   Procedure: PERIPHERAL VASCULAR BALLOON ANGIOPLASTY;  Surgeon: Lorretta Harp, MD;  Location: [place] [facility];  Service: Cardiovascular;;  rt SFA  atherectomy and DCB   PERIPHERAL VASCULAR CATHETERIZATION N/A 11/15/2015   Procedure: Carotid PTA/Stent Intervention;  Surgeon: Lorretta Harp, MD;  Location: [place] [facility];  Service: Cardiovascular;  Laterality: N/A;   PERIPHERAL VASCULAR CATHETERIZATION  11/15/2015   Procedure: Carotid Angiography;  Surgeon: Lorretta Harp, MD;  Location: [place] [facility] LAB;  Service: Cardiovascular;;   RIGHT HEART CATH N/A 01/27/2020   Procedure: RIGHT HEART CATH;  Surgeon: Larey Dresser, MD;  Location: [place] [facility];  Service: Cardiovascular;  Laterality: N/A;   VIDEO BRONCHOSCOPY WITH ENDOBRONCHIAL ULTRASOUND N/A 09/18/2021   Procedure: VIDEO BRONCHOSCOPY WITH ENDOBRONCHIAL ULTRASOUND;  Surgeon: Collene Gobble, MD;   Location: [place] ENDOSCOPY;  Service: Pulmonary;  Laterality: N/A;   VIDEO BRONCHOSCOPY WITH RADIAL ENDOBRONCHIAL ULTRASOUND  09/18/2021   Procedure: RADIAL ENDOBRONCHIAL ULTRASOUND;  Surgeon: Collene Gobble, MD;  Location: [place] ENDOSCOPY;  Service: Pulmonary;;    Allergies: Bee venom, Benadryl [diphenhydramine], Entresto [sacubitril-valsartan], Farxiga [dapagliflozin], Tetracycline, Canagliflozin, Other, Sitagliptin, and Ace inhibitors  Medications: Prior to Admission medications   Medication Sig Start Date End Date Taking? Authorizing Provider  acetaminophen (TYLENOL) 500 MG tablet Take 1,000 mg by mouth at bedtime.   Yes [provider]  albuterol (PROVENTIL) (2.5 MG/3ML) 0.083% nebulizer solution Take 3 mLs (2.5 mg total) by nebulization every 6 (six) hours as needed for wheezing or shortness of breath. 08/30/21  Yes [name], MD  albuterol (VENTOLIN HFA) 108 (90 Base) MCG/ACT inhaler Inhale 2 puffs into the lungs every 6 (six) hours as needed for wheezing or shortness of breath. 08/30/21  Yes [name], MD  aspirin EC 81 MG tablet Take 81 mg by mouth every evening.  05/16/15  Yes Martinique, Peter M, MD  clopidogrel (PLAVIX) 75 MG tablet Take  1 tablet (75 mg total) by mouth daily. 02/17/21  Yes Larey Dresser, MD  dexlansoprazole (DEXILANT) 60 MG capsule Take 1 capsule (60 mg total) by mouth every evening. 09/18/21  Yes Collene Gobble, MD  digoxin (LANOXIN) 0.125 MG tablet Take 0.5 tablets (0.0625 mg total) by mouth daily. 02/17/21  Yes Larey Dresser, MD  ezetimibe (ZETIA) 10 MG tablet Take 1 tablet (10 mg total) by mouth daily. 02/17/21  Yes Larey Dresser, MD  fluticasone [facility]) 50 MCG/ACT nasal spray Place 2 sprays into both nostrils daily. Patient taking differently: Place 2 sprays into both nostrils daily as needed for allergies. 08/30/21  Yes [name], MD  gabapentin (NEURONTIN) 300 MG capsule Take 2 capsules (600 mg total) by mouth at bedtime. TAKE 2 CAPSULES BY  MOUTH EVERY DAY AT BEDTIME 09/18/21  Yes Byrum, Rose Fillers, MD  HYDROcodone-acetaminophen (NORCO) 5-325 MG tablet Take 1 tablet by mouth daily as needed for severe pain. 06/26/21  Yes Tower, [name], MD  icosapent Ethyl (VASCEPA) 1 g capsule Take 2 capsules (2 g total) by mouth 2 (two) times daily. 09/20/21  Yes Milford, Maricela Bo, FNP  insulin aspart protamine- aspart (NOVOLOG MIX 70/30) (70-30) 100 UNIT/ML injection Inject 5-65 Units into the skin See admin instructions. 65 units in the Am with breakfast and 5 units with lunch and 55 units with dinner   Yes [provider]  levETIRAcetam (KEPPRA) 1000 MG tablet Take 1 tablet (1,000 mg total) by mouth 2 (two) times daily. 09/18/21  Yes Collene Gobble, MD  metFORMIN (GLUCOPHAGE-XR) 500 MG 24 hr tablet Take 500 mg by mouth every evening.  08/30/17  Yes [provider]  metolazone (ZAROXOLYN) 2.5 MG tablet Take 1 tablet (2.5 mg total) by mouth 2 (two) times a week. Every Mon and Fri 02/20/21  Yes Larey Dresser, MD  nicotine polacrilex (NICORETTE) 4 MG gum Take 4 mg by mouth daily as needed for smoking cessation.   Yes [provider]  nitroGLYCERIN (NITROLINGUAL) 0.4 MG/SPRAY spray Place 1 spray under the tongue every 5 (five) minutes x 3 doses as needed for chest pain. 05/25/19  Yes Martinique, Peter M, MD  polyethylene glycol (MIRALAX / GLYCOLAX) 17 g packet Take 17 g by mouth daily.   Yes [provider]  potassium chloride SA (KLOR-CON M20) 20 MEQ tablet Take 2 tablets (40 mEq total) by mouth every morning AND 1 tablet (20 mEq total) every evening. 09/20/21  Yes Milford, Maricela Bo, FNP  ranolazine (RANEXA) 1000 MG SR tablet Take 1 tablet (1,000 mg total) by mouth 2 (two) times daily. 02/17/21  Yes Larey Dresser, MD  rosuvastatin (CRESTOR) 20 MG tablet Take 1 tablet (20 mg total) by mouth at bedtime. 09/18/21  Yes Collene Gobble, MD  spironolactone (ALDACTONE) 25 MG tablet Take 1 tablet (25 mg total) by mouth at  bedtime. TAKE 1 TABLET BY MOUTH EVERYDAY AT BEDTIME 09/18/21  Yes Byrum, Rose Fillers, MD  torsemide (DEMADEX) 20 MG tablet Take 3 tablets (60 mg total) by mouth 2 (two) times daily. 09/20/21  Yes Milford, Maricela Bo, FNP  Vericiguat 10 MG TABS Take 10 mg by mouth daily. 09/18/21 09/18/22 Yes Collene Gobble, MD  diphenhydrAMINE (BENADRYL) 25 mg capsule Take 25 mg by mouth 2 (two) times daily as needed for itching or allergies.  Patient not taking: Reported on 10/04/2021    [provider]  glucose blood (ONETOUCH ULTRA) test strip 3 (three) times daily.  [provider]  guaiFENesin (MUCINEX) 600 MG 12 hr tablet Take 2 tablets (1,200 mg total) by mouth 2 (two) times daily. 09/18/21   Collene Gobble, MD  lidocaine-prilocaine (EMLA) cream Apply to the Port-A-Cath site 30-60 minutes before chemotherapy 10/04/21   Curt Bears, MD  nicotine (NICODERM CQ - DOSED IN MG/24 HOURS) 21 mg/24hr patch Place 21 mg onto the skin daily as needed (to cease smoking). Patient not taking: Reported on 10/04/2021    [provider]  nystatin (MYCOSTATIN/NYSTOP) powder APPLY 1 APPLICATION        TOPICALLY 4 TIMES A DAY AS NEEDED FOR YEAST INFECTIONS Patient not taking: Reported on 10/04/2021 02/23/21   Tower, [name], MD  ONE TOUCH ULTRA TEST test strip USE AS DIRECTED FOR TESTING BLOOD GLUCOSE 3 TIMES DAILY 08/07/16   [provider]  prochlorperazine (COMPAZINE) 10 MG tablet Take 1 tablet (10 mg total) by mouth every 6 (six) hours as needed for nausea or vomiting. 10/04/21   Curt Bears, MD  sodium chloride (OCEAN) 0.65 % SOLN nasal spray Place 1 spray into both nostrils as needed for congestion.    [provider]  triamcinolone cream (KENALOG) 0.5 % Apply 1 application topically 3 (three) times daily as needed (rash). To affected areas Patient not taking: Reported on 10/04/2021 09/18/21   Collene Gobble, MD     Family History  Problem Relation Age of Onset   Heart disease  Mother    Diabetes Mother    COPD Mother    Hyperlipidemia Mother    Hypertension Mother    Cancer Father        met, origin unknown   Heart attack Father    Drug abuse Paternal Grandmother    Stroke Paternal Grandfather    Lung cancer Paternal Aunt        lung with mets to brain   Melanoma Paternal Uncle    Lung cancer Paternal Uncle        lung/liver to brain   Cancer Paternal Uncle        cancer of unknown type   Heart disease Maternal Grandfather    Hypertension Sister    Cancer Sister        eyelid   Glaucoma Sister    Heart disease Maternal Aunt        x 2 aunts    Social History   Socioeconomic History   Marital status: Divorced    Spouse name: Not on file   Number of children: 0   Years of education: Not on file   Highest education level: Not on file  Occupational History   Occupation: disabled  Tobacco Use   Smoking status: Every Day    Packs/day: 1.00    Years: 51.00    Pack years: 51.00    Types: Cigarettes    Start date: 49    Last attempt to quit: 02/22/2020    Years since quitting: 1.6   Smokeless tobacco: Never   Tobacco comments:    Pt smokes a 1 pack a day 08/30/2021 / pl  Vaping Use   Vaping Use: Former  Substance and Sexual Activity   Alcohol use: No   Drug use: No   Sexual activity: Not Currently    Partners: Male    Birth control/protection: None  Other Topics Concern   Not on file  Social History Narrative   Right handed   Lives alone in a one story home   Social Determinants  of Health   Financial Resource Strain: Not on file  Food Insecurity: Not on file  Transportation Needs: Not on file  Physical Activity: Not on file  Stress: Not on file  Social Connections: Not on file      Review of Systems :denies fever,HA, abd pain,vomiting or bleeding; she had a recent fall at home and has some rt lateral chest/abd/hip discomfort, dyspnea with exertion, occ cough  Vital Signs: BP (!) 122/59    Pulse 89    Temp 98.1 F (36.7  C) (Oral)    Resp 18    LMP 09/02/2003    SpO2 98%   Physical Exam awake/alert; chest- distant BS bilat, occ insp wheeze; heart- RRR,+murmur, pacer/defib left chest wall; no LE edema  Imaging: CT HEAD W & WO CONTRAST (5MM)  Result Date: 10/20/2021 CLINICAL DATA:  Small cell lung cancer staging.  Seizures EXAM: CT HEAD WITHOUT AND WITH CONTRAST TECHNIQUE: Contiguous axial images were obtained from the base of the skull through the vertex without and with intravenous contrast. RADIATION DOSE REDUCTION: This exam was performed according to the departmental dose-optimization program which includes automated exposure control, adjustment of the mA and/or kV according to patient size and/or use of iterative reconstruction technique. CONTRAST:  104m OMNIPAQUE IOHEXOL 300 MG/ML  SOLN COMPARISON:  Head CT 10/10/2017 FINDINGS: Brain: No swelling or enhancement to suggest metastatic disease. Moderate to large remote right parietal and posterior temporal infarct with diffuse cortical involvement in this patient with history of seizure. Age normal brain volume and white matter appearance. No hydrocephalus or collection Vascular: Major vessels are enhancing Skull: Negative Sinuses/Orbits: Negative IMPRESSION: 1. No evidence of metastatic disease to the brain. 2. Moderate to large remote right MCA branch infarct which involves cortex in this patient with history of seizure. Electronically Signed   By: JJorje GuildM.D.   On: 10/20/2021 08:25   NM PET Image Initial (PI) Skull Base To Thigh (F-18 FDG)  Result Date: 10/20/2021 CLINICAL DATA:  Initial treatment strategy for small-cell lung cancer. EXAM: NUCLEAR MEDICINE PET SKULL BASE TO THIGH TECHNIQUE: 7.2 mCi F-18 FDG was injected intravenously. Full-ring PET imaging was performed from the skull base to thigh after the radiotracer. CT data was obtained and used for attenuation correction and anatomic localization. Fasting blood glucose: 99 mg/dl COMPARISON:  Chest CT  09/08/2021 FINDINGS: Mediastinal blood pool activity: SUV max 2.3 Liver activity: SUV max NA NECK: No hypermetabolic lymph nodes in the neck. Incidental CT findings: none CHEST: Bulky right paratracheal lymphadenopathy is markedly hypermetabolic with SUV max = 8.9. Patient's right hilar lymphadenopathy is also hypermetabolic with SUV max = 9.0 The right upper lobe pulmonary nodule seen on previous CT of 09/08/2021 have almost completely resolved in the interval. No hypermetabolism in this region on PET imaging today. Incidental CT findings: Coronary artery calcification is evident. Moderate atherosclerotic calcification is noted in the wall of the thoracic aorta. Status post CABG. 5 mm right upper lobe nodule on 57/4 is stable in the interval. Adjacent additional right upper lobe nodule seen on the previous CT have resolved in the interval. No new suspicious nodule or mass by CT imaging. ABDOMEN/PELVIS: No abnormal hypermetabolic activity within the liver, pancreas, adrenal glands, or spleen. No hypermetabolic lymph nodes in the abdomen or pelvis. Incidental CT findings: Tiny nonobstructing stone noted interpolar right kidney with small nonobstructing stone or vascular calcification noted upper pole left kidney. There is moderate atherosclerotic calcification of the abdominal aorta without aneurysm. SKELETON: Uptake identified in  the anterior fourth rib is associated with what appears to be in acute fracture (axial image 70/series 4). There is also focal uptake associated with the anterior left fifth rib (image 80/4) also associated with acute nondisplaced fracture. No findings to raise concern for hypermetabolic bony metastases. Incidental CT findings: none IMPRESSION: 1. Hypermetabolic right hilar and right mediastinal lymphadenopathy, consistent with the patient's known neoplasm. No evidence for hypermetabolic metastatic disease in the neck, abdomen, or pelvis. 2. Cluster of small right upper lobe pulmonary  nodule seen on the previous CT scan have nearly resolved in the interval. 3. Acute nondisplaced fractures of the anterior right fourth and left fifth ribs, showing low level hypermetabolism. No findings to suggest definite hypermetabolic bony metastatic disease on today's study. 4.  Aortic Atherosclerois (ICD10-170.0) Electronically Signed   By: Misty Stanley M.D.   On: 10/20/2021 11:06    Labs:  CBC: Recent Labs    06/19/21 1441 09/18/21 1025 10/04/21 1411 10/16/21 1211  WBC 8.2 9.8 6.0 7.3  HGB 14.5 10.2* 8.7* 9.0*  HCT 42.4 33.6* 29.2* 30.0*  PLT 261.0 278 262 284    COAGS: Recent Labs    09/18/21 1025  INR 1.0  APTT 27    BMP: Recent Labs    02/17/21 1427 06/19/21 1441 09/18/21 1025 10/04/21 1411 10/16/21 1211  NA 134* 137 129* 134* 129*  K 4.1 4.9 3.6 3.9 3.4*  CL 95* 99 93* 98 90*  CO2 _0 GLUCOSE 274* 205* 200* 277* 179*  BUN _1 26*  CALCIUM 9.6 10.0 8.9 9.1 9.6  CREATININE 0.81 0.80 0.77 0.93 0.99  GFRNONAA >60  --  >60 >60 >60    LIVER FUNCTION TESTS: Recent Labs    06/19/21 1441 09/18/21 1025 10/04/21 1411 10/16/21 1211  BILITOT 0.5 0.7 0.3 0.4  AST _2 14*  ALT _3 ALKPHOS 52 59 67 75  PROT 7.3 6.2* 7.0 7.5  ALBUMIN 4.5 3.3* 3.7 4.3    TUMOR MARKERS: No results for input(s): AFPTM, CEA, CA199, CHROMGRNA in the last 8760 hours.  Assessment and Plan: 66 y.o. female smoker with hx recently diagnosed extensive stage (T3, N2, M1b) small cell lung cancer who presented with multiple nodules in the right upper lobe in addition to right hilar and mediastinal lymphadenopathy and left adrenal gland metastasis pending further staging work-up diagnosed in December 2022.  Additional medical history includes carotid stenosis with prior right carotid stent, CHF, COPD, coronary artery disease with prior CABG, depression, diabetes, hyperlipidemia, fatty liver, fibromyalgia, GERD, nephrolithiasis, hypertension, seizures, sleep  apnea, prior cerebral infarcts.  She presents today for Port-A-Cath placement to assist with additional treatment.Risks and benefits of image guided port-a-catheter placement was discussed with the patient including, but not limited to bleeding, infection, pneumothorax, or fibrin sheath development and need for additional procedures.  All of the patient's questions were answered, patient is agreeable to proceed. Consent signed and in chart.    Thank you for this interesting consult.  I greatly enjoyed meeting Diane [name] and look forward to participating in their care.  A copy of this report was sent to the requesting provider on this date.  Electronically Signed: D. Rowe Robert, PA-C 10/20/2021, 1:41 PM   I spent a total of   25 minutes  in face to face in clinical consultation, greater than 50% of which was counseling/coordinating care for port cath placement

## 2021-10-20 NOTE — Progress Notes (Signed)
[facility] OFFICE PROGRESS NOTE  Tower, [name], MD [place] [id number]  DIAGNOSIS:  1) Limited stage (T3, N2, M0) small cell lung cancer presented with right hilar and mediastinal lymphadenopathy. There was no hypermetabolism in the left adrenal gland lesion. Diagnosed in December 2022. 2)  Iron Deficiency Anemia  PRIOR THERAPY:  1) Systemic chemotherapy with systemic chemotherapy with carboplatin for an AUC of 5, etoposide 100 mg per metered squared, and Imfinzi 1500 mg IV every 3 weeks with Cosela for myeloprotection.  First dose on 10/16/2021.  Discontinued after 1 cycle as the patient staging PET scan showed limited stage disease.  CURRENT THERAPY: 1) systemic chemotherapy with carboplatin for an AUC of 5 on day 1 and etoposide 100 mg per metered squared on days 1, 2, and 3 IV every 3 weeks.  First dose expected on 11/06/2021.  2) Venofer infusions as needed. Last dose on 02/07/21     INTERVAL HISTORY: Diane [name] 66 y.o. female returns to the clinic today for a follow-up visit.  The patient was originally seen in the clinic for iron deficiency anemia.  Her most recent iron infusion was on 02/07/2021. Unfortunately, the patient was recently diagnosed with small cell lung cancer.  The patient's initial staging work-up showed a right upper lobe lung mass with hilar and mediastinal lymphadenopathy.  There is also a left adrenal gland lesion.  The patient completed the staging work-up with a PET scan and a CT scan of the head since she is unable to have brain MRIs due to her ICD.  The brain MRI did not show any evidence of metastatic disease to the brain.  The patient had a staging PET scan which did not show any hypermetabolism in the adrenal gland.  This means that her small cell lung cancer is likely limited stage disease as opposed to extensive stage disease.   The patient completed 1 cycle of systemic chemotherapy.  She tolerated it well despite her  multiple comorbidities.  She reports she had some nausea that started yesterday without any vomiting.  She took her Compazine which was not effective for her.  She states Zofran is also not effective for her.  She states the only antiemetic that works for her is Phenergan.   On 10/15/2021, the patient reports that she fell at home.  She is on Plavix and aspirin and has bruising on her extremities as well as on her right jaw.  She denies hitting her head or any loss of consciousness.  She also has been having some soreness in the ribs.  Her staging PET scan did show bilateral rib fractures in the right fourth rib and left fifth rib.  She has a prescription for Norco which she has not taken.  She has been using Tylenol and heating pads.  She denies any fever, chills, night sweats, or unexplained weight loss.  She reports her baseline dyspnea on exertion.  She denies cough. She denies any hemoptysis.  Denies any diarrhea or constipation.  Denies any headache or visual changes.  She is here today for evaluation of 1 week follow-up visit and to review her staging CT scan of the head and PET scan.    MEDICAL HISTORY: Past Medical History:  Diagnosis Date   Anemia    Arthritis    Carotid stenosis    a. s/p right carotid stent 10/2017.   Chronic systolic CHF (congestive heart failure) (HCC)    Colon polyps  COPD (chronic obstructive pulmonary disease) (HCC)    Coronary artery disease    post CABG in 3/07 , coronary stents    Depression    after major stroke years ago, no longer treated   Diabetes mellitus    type 2   Dyslipidemia    Fatty liver    Fibromyalgia    GERD (gastroesophageal reflux disease)    Headache    hx of    History of kidney stones    Hyperlipidemia    Hypertension    Myocardial infarction ([place])    Pneumonia    hx of    Seizures ([place])    last seizure- 03/2013    Shortness of breath dyspnea    with exertion or when fluid builds up    Sleep apnea    used to wear a cpap-  not used in 3 years    Status post dilation of esophageal narrowing    Stroke [facility]) 1993   problems with balance    Systolic murmur    known mild AS and MR   Thyroid goiter    Tobacco abuse     ALLERGIES:  is allergic to bee venom, benadryl [diphenhydramine], entresto [sacubitril-valsartan], farxiga [dapagliflozin], tetracycline, canagliflozin, other, sitagliptin, and ace inhibitors.  MEDICATIONS:  Current Outpatient Medications  Medication Sig Dispense Refill   promethazine (PHENERGAN) 12.5 MG tablet Take 1 tablet (12.5 mg total) by mouth every 6 (six) hours as needed for nausea or vomiting. 30 tablet 2   acetaminophen (TYLENOL) 500 MG tablet Take 1,000 mg by mouth at bedtime.     albuterol (PROVENTIL) (2.5 MG/3ML) 0.083% nebulizer solution Take 3 mLs (2.5 mg total) by nebulization every 6 (six) hours as needed for wheezing or shortness of breath. 1180 mL 3   albuterol (VENTOLIN HFA) 108 (90 Base) MCG/ACT inhaler Inhale 2 puffs into the lungs every 6 (six) hours as needed for wheezing or shortness of breath. 24 g 3   aspirin EC 81 MG tablet Take 81 mg by mouth every evening.  30 tablet 6   clopidogrel (PLAVIX) 75 MG tablet Take 1 tablet (75 mg total) by mouth daily. 90 tablet 3   dexlansoprazole (DEXILANT) 60 MG capsule Take 1 capsule (60 mg total) by mouth every evening.     digoxin (LANOXIN) 0.125 MG tablet Take 0.5 tablets (0.0625 mg total) by mouth daily. 45 tablet 3   diphenhydrAMINE (BENADRYL) 25 mg capsule Take 25 mg by mouth 2 (two) times daily as needed for itching or allergies.  (Patient not taking: Reported on 10/04/2021)     ezetimibe (ZETIA) 10 MG tablet Take 1 tablet (10 mg total) by mouth daily. 90 tablet 3   fluticasone (FLONASE) 50 MCG/ACT nasal spray Place 2 sprays into both nostrils daily. (Patient taking differently: Place 2 sprays into both nostrils daily as needed for allergies.) 48 g 3   gabapentin (NEURONTIN) 300 MG capsule Take 2 capsules (600 mg total) by mouth at  bedtime. TAKE 2 CAPSULES BY MOUTH EVERY DAY AT BEDTIME     glucose blood (ONETOUCH ULTRA) test strip 3 (three) times daily.     guaiFENesin (MUCINEX) 600 MG 12 hr tablet Take 2 tablets (1,200 mg total) by mouth 2 (two) times daily. 360 tablet 3   HYDROcodone-acetaminophen (NORCO) 5-325 MG tablet Take 1 tablet by mouth daily as needed for severe pain. 15 tablet 0   icosapent Ethyl (VASCEPA) 1 g capsule Take 2 capsules (2 g total) by mouth 2 (two) times daily.  360 capsule 3   insulin aspart protamine- aspart (NOVOLOG MIX 70/30) (70-30) 100 UNIT/ML injection Inject 5-65 Units into the skin See admin instructions. 65 units in the Am with breakfast and 5 units with lunch and 55 units with dinner     levETIRAcetam (KEPPRA) 1000 MG tablet Take 1 tablet (1,000 mg total) by mouth 2 (two) times daily.     lidocaine-prilocaine (EMLA) cream Apply to the Port-A-Cath site 30-60 minutes before chemotherapy 30 g 0   metFORMIN (GLUCOPHAGE-XR) 500 MG 24 hr tablet Take 500 mg by mouth every evening.   6   metolazone (ZAROXOLYN) 2.5 MG tablet Take 1 tablet (2.5 mg total) by mouth 2 (two) times a week. Every Mon and Fri 30 tablet 3   nicotine (NICODERM CQ - DOSED IN MG/24 HOURS) 21 mg/24hr patch Place 21 mg onto the skin daily as needed (to cease smoking). (Patient not taking: Reported on 10/04/2021)     nicotine polacrilex (NICORETTE) 4 MG gum Take 4 mg by mouth daily as needed for smoking cessation.     nitroGLYCERIN (NITROLINGUAL) 0.4 MG/SPRAY spray Place 1 spray under the tongue every 5 (five) minutes x 3 doses as needed for chest pain. 12 g 3   nystatin (MYCOSTATIN/NYSTOP) powder APPLY 1 APPLICATION        TOPICALLY 4 TIMES A DAY AS NEEDED FOR YEAST INFECTIONS (Patient not taking: Reported on 10/04/2021) 60 g 5   ONE TOUCH ULTRA TEST test strip USE AS DIRECTED FOR TESTING BLOOD GLUCOSE 3 TIMES DAILY  1   polyethylene glycol (MIRALAX / GLYCOLAX) 17 g packet Take 17 g by mouth daily.     potassium chloride SA (KLOR-CON  M20) 20 MEQ tablet Take 2 tablets (40 mEq total) by mouth every morning AND 1 tablet (20 mEq total) every evening. 270 tablet 3   prochlorperazine (COMPAZINE) 10 MG tablet Take 1 tablet (10 mg total) by mouth every 6 (six) hours as needed for nausea or vomiting. 30 tablet 0   ranolazine (RANEXA) 1000 MG SR tablet Take 1 tablet (1,000 mg total) by mouth 2 (two) times daily. 180 tablet 3   rosuvastatin (CRESTOR) 20 MG tablet Take 1 tablet (20 mg total) by mouth at bedtime.     sodium chloride (OCEAN) 0.65 % SOLN nasal spray Place 1 spray into both nostrils as needed for congestion.     spironolactone (ALDACTONE) 25 MG tablet Take 1 tablet (25 mg total) by mouth at bedtime. TAKE 1 TABLET BY MOUTH EVERYDAY AT BEDTIME     torsemide (DEMADEX) 20 MG tablet Take 3 tablets (60 mg total) by mouth 2 (two) times daily. 540 tablet 3   triamcinolone cream (KENALOG) 0.5 % Apply 1 application topically 3 (three) times daily as needed (rash). To affected areas (Patient not taking: Reported on 10/04/2021)     Vericiguat 10 MG TABS Take 10 mg by mouth daily.     No current facility-administered medications for this visit.    SURGICAL HISTORY:  Past Surgical History:  Procedure Laterality Date   ABDOMINAL AORTOGRAM W/LOWER EXTREMITY N/A 04/18/2020   Procedure: ABDOMINAL AORTOGRAM W/LOWER EXTREMITY;  Surgeon: Lorretta Harp, MD;  Location: [facility];  Service: Cardiovascular;  Laterality: N/A;   APPENDECTOMY     BIOPSY  02/03/2019   Procedure: BIOPSY;  Surgeon: Ladene Artist, MD;  Location: [facility] ENDOSCOPY;  Service: Endoscopy;;   BREAST BIOPSY Left 2016   BRONCHIAL BIOPSY  09/18/2021   Procedure: BRONCHIAL BIOPSIES;  Surgeon: Lamonte Sakai,  Rose Fillers, MD;  Location: [facility] ENDOSCOPY;  Service: Pulmonary;;   BRONCHIAL BRUSHINGS  09/18/2021   Procedure: BRONCHIAL BRUSHINGS;  Surgeon: Collene Gobble, MD;  Location: [facility] ENDOSCOPY;  Service: Pulmonary;;   BRONCHIAL NEEDLE ASPIRATION BIOPSY  09/18/2021   Procedure:  BRONCHIAL NEEDLE ASPIRATION BIOPSIES;  Surgeon: Collene Gobble, MD;  Location: [facility] ENDOSCOPY;  Service: Pulmonary;;   CARDIAC CATHETERIZATION  11/29/05   EF of 55%   CARDIAC CATHETERIZATION  08/06/06   EF of 45-50%   CARDIAC CATHETERIZATION N/A 05/06/2015   Procedure: Right/Left Heart Cath and Coronary/Graft Angiography;  Surgeon: Peter M Martinique, MD;  Location: [facility];  Service: Cardiovascular;  Laterality: N/A;   CAROTID PTA/STENT INTERVENTION N/A 10/10/2017   Procedure: CAROTID PTA/STENT INTERVENTION - Right;  Surgeon: Serafina Mitchell, MD;  Location: [facility];  Service: Cardiovascular;  Laterality: N/A;   CERVICAL FUSION  1990   CHOLECYSTECTOMY     COLON RESECTION     mass removed and 4 in of colon   COLONOSCOPY WITH PROPOFOL N/A 07/16/2017   Procedure: COLONOSCOPY WITH PROPOFOL;  Surgeon: Ladene Artist, MD;  Location: [facility] ENDOSCOPY;  Service: Endoscopy;  Laterality: N/A;   CORONARY ARTERY BYPASS GRAFT  12/04/2005   x5 -- left internal mammary artery to the LAD, left radial artery to the ramus intermedius, saphenous vein graft to the obtuse marginal 1, sequential saphenous vein grat to the acute marginal and posterior descending, endoscopic vein harvesting from the left thigh with open vein harvest from right leg   CORONARY STENT PLACEMENT  08/11/06   PCI of her ciurcumflex/OM vessel   ESOPHAGOGASTRODUODENOSCOPY (EGD) WITH PROPOFOL N/A 02/03/2019   Procedure: ESOPHAGOGASTRODUODENOSCOPY (EGD) WITH PROPOFOL;  Surgeon: Ladene Artist, MD;  Location: [facility];  Service: Endoscopy;  Laterality: N/A;   FINE NEEDLE ASPIRATION  09/18/2021   Procedure: FINE NEEDLE ASPIRATION (FNA) LINEAR;  Surgeon: Collene Gobble, MD;  Location: [facility] ENDOSCOPY;  Service: Pulmonary;;   ICD IMPLANT N/A 07/15/2020   Procedure: ICD IMPLANT;  Surgeon: Vickie Epley, MD;  Location: [facility];  Service: Cardiovascular;  Laterality: N/A;   IR IMAGING GUIDED PORT INSERTION  10/20/2021    LAPAROTOMY Bilateral 05/19/2015   Procedure: EXPLORATORY LAPAROTOMY WITH BILATERAL SALPINGO OOPHORECTOMY /OMENTECTOMY/SEGMENTAL SIGMOID COLECTOMY ;  Surgeon: Everitt Amber, MD;  Location: [facility];  Service: Gynecology;  Laterality: Bilateral;   PERIPHERAL VASCULAR BALLOON ANGIOPLASTY  04/18/2020   Procedure: PERIPHERAL VASCULAR BALLOON ANGIOPLASTY;  Surgeon: Lorretta Harp, MD;  Location: [facility];  Service: Cardiovascular;;  rt SFA  atherectomy and DCB   PERIPHERAL VASCULAR CATHETERIZATION N/A 11/15/2015   Procedure: Carotid PTA/Stent Intervention;  Surgeon: Lorretta Harp, MD;  Location: [facility];  Service: Cardiovascular;  Laterality: N/A;   PERIPHERAL VASCULAR CATHETERIZATION  11/15/2015   Procedure: Carotid Angiography;  Surgeon: Lorretta Harp, MD;  Location: [facility];  Service: Cardiovascular;;   RIGHT HEART CATH N/A 01/27/2020   Procedure: RIGHT HEART CATH;  Surgeon: Larey Dresser, MD;  Location: [place] [facility];  Service: Cardiovascular;  Laterality: N/A;   VIDEO BRONCHOSCOPY WITH ENDOBRONCHIAL ULTRASOUND N/A 09/18/2021   Procedure: VIDEO BRONCHOSCOPY WITH ENDOBRONCHIAL ULTRASOUND;  Surgeon: Collene Gobble, MD;  Location: [facility] ENDOSCOPY;  Service: Pulmonary;  Laterality: N/A;   VIDEO BRONCHOSCOPY WITH RADIAL ENDOBRONCHIAL ULTRASOUND  09/18/2021   Procedure: RADIAL ENDOBRONCHIAL ULTRASOUND;  Surgeon: Collene Gobble, MD;  Location: [facility] ENDOSCOPY;  Service: Pulmonary;;  REVIEW OF SYSTEMS:   Review of Systems  Constitutional: Positive for baseline fatigue.  Positive for slightly diminished appetite.  Negative for chills, fever and unexpected weight change.  HENT: Negative for mouth sores, nosebleeds, sore throat and trouble swallowing.   Eyes: Negative for eye problems and icterus.  Respiratory: Positive for baseline dyspnea on exertion.  Negative for cough, hemoptysis, and wheezing.   Cardiovascular: Positive for chest/rib soreness. Negative for leg  swelling.  Gastrointestinal: Positive for mild nausea without vomiting.  Negative for abdominal pain, constipation, and diarrhea. Genitourinary: Negative for bladder incontinence, difficulty urinating, dysuria, frequency and hematuria.   Musculoskeletal: Negative for back pain, gait problem, neck pain and neck stiffness.  Skin: Negative for itching and rash.  Neurological: Negative for dizziness, extremity weakness, gait problem, headaches, light-headedness and seizures.  Hematological: Negative for adenopathy.  Positive for bilateral upper extremity bruising and bruising on the right jaw. Psychiatric/Behavioral: Negative for confusion, depression and sleep disturbance. The patient is not nervous/anxious.     PHYSICAL EXAMINATION:  Blood pressure (!) 100/50, pulse 96, temperature (!) 96.7 F (35.9 C), temperature source Tympanic, resp. rate 18, weight 130 lb 7 oz (59.2 kg), last menstrual period 09/02/2003, SpO2 93 %.  ECOG PERFORMANCE STATUS: 2  Physical Exam  Constitutional: Oriented to person, place, and time and chronically ill-appearing female and in no distress. HENT:  Head: Normocephalic.  Several bruises noted on right jaw. Mouth/Throat: Oropharynx is clear and moist. No oropharyngeal exudate.  Eyes: Conjunctivae are normal. Right eye exhibits no discharge. Left eye exhibits no discharge. No scleral icterus.  Neck: Normal range of motion. Neck supple.  Cardiovascular: Normal rate, regular rhythm, systolic murmur noted and intact distal pulses.   Pulmonary/Chest: Effort normal and breath sounds normal. No respiratory distress. No wheezes. No rales.  Abdominal: Soft. Bowel sounds are normal. Exhibits no distension and no mass. There is no tenderness.  Musculoskeletal: Normal range of motion. Exhibits no edema.  Lymphadenopathy:    No cervical adenopathy.  Neurological: Alert and oriented to person, place, and time.  Muscle wasting.  Patient ambulates with a walker.   Skin: Skin is  warm and dry. No rash noted. Not diaphoretic. No erythema. No pallor.  Bruising noted bilaterally on upper extremities. Psychiatric: Mood, memory and judgment normal.  Vitals reviewed.  LABORATORY DATA: Lab Results  Component Value Date   WBC 4.6 10/23/2021   HGB 7.6 (L) 10/23/2021   HCT 25.5 (L) 10/23/2021   MCV 74.1 (L) 10/23/2021   PLT 174 10/23/2021      Chemistry      Component Value Date/Time   NA 130 (L) 10/23/2021 1440   NA 135 04/14/2020 1544   NA 135 (L) 02/15/2017 1457   K 4.1 10/23/2021 1440   K 4.8 02/15/2017 1457   CL 93 (L) 10/23/2021 1440   CO2 28 10/23/2021 1440   CO2 26 02/15/2017 1457   BUN 24 (H) 10/23/2021 1440   BUN 17 04/14/2020 1544   BUN 10.8 02/15/2017 1457   CREATININE 0.96 10/23/2021 1440   CREATININE 0.69 05/06/2017 1405   CREATININE 0.7 02/15/2017 1457      Component Value Date/Time   CALCIUM 9.3 10/23/2021 1440   CALCIUM 9.3 02/15/2017 1457   ALKPHOS 83 10/23/2021 1440   ALKPHOS 75 02/15/2017 1457   AST 15 10/23/2021 1440   AST 9 02/15/2017 1457   ALT 12 10/23/2021 1440   ALT 13 02/15/2017 1457   BILITOT 0.4 10/23/2021 1440   BILITOT 0.28 02/15/2017  Diane [name] STUDIES:  CT HEAD W & WO CONTRAST (5MM)  Result Date: 10/20/2021 CLINICAL DATA:  Small cell lung cancer staging.  Seizures EXAM: CT HEAD WITHOUT AND WITH CONTRAST TECHNIQUE: Contiguous axial images were obtained from the base of the skull through the vertex without and with intravenous contrast. RADIATION DOSE REDUCTION: This exam was performed according to the departmental dose-optimization program which includes automated exposure control, adjustment of the mA and/or kV according to patient size and/or use of iterative reconstruction technique. CONTRAST:  67mL OMNIPAQUE IOHEXOL 300 MG/ML  SOLN COMPARISON:  Head CT 10/10/2017 FINDINGS: Brain: No swelling or enhancement to suggest metastatic disease. Moderate to large remote right parietal and posterior temporal  infarct with diffuse cortical involvement in this patient with history of seizure. Age normal brain volume and white matter appearance. No hydrocephalus or collection Vascular: Major vessels are enhancing Skull: Negative Sinuses/Orbits: Negative IMPRESSION: 1. No evidence of metastatic disease to the brain. 2. Moderate to large remote right MCA branch infarct which involves cortex in this patient with history of seizure. Electronically Signed   By: Jorje Guild M.D.   On: 10/20/2021 08:25   NM PET Image Initial (PI) Skull Base To Thigh (F-18 FDG)  Result Date: 10/20/2021 CLINICAL DATA:  Initial treatment strategy for small-cell lung cancer. EXAM: NUCLEAR MEDICINE PET SKULL BASE TO THIGH TECHNIQUE: 7.2 mCi F-18 FDG was injected intravenously. Full-ring PET imaging was performed from the skull base to thigh after the radiotracer. CT data was obtained and used for attenuation correction and anatomic localization. Fasting blood glucose: 99 mg/dl COMPARISON:  Chest CT 09/08/2021 FINDINGS: Mediastinal blood pool activity: SUV max 2.3 Liver activity: SUV max NA NECK: No hypermetabolic lymph nodes in the neck. Incidental CT findings: none CHEST: Bulky right paratracheal lymphadenopathy is markedly hypermetabolic with SUV max = 8.9. Patient's right hilar lymphadenopathy is also hypermetabolic with SUV max = 9.0 The right upper lobe pulmonary nodule seen on previous CT of 09/08/2021 have almost completely resolved in the interval. No hypermetabolism in this region on PET imaging today. Incidental CT findings: Coronary artery calcification is evident. Moderate atherosclerotic calcification is noted in the wall of the thoracic aorta. Status post CABG. 5 mm right upper lobe nodule on 57/4 is stable in the interval. Adjacent additional right upper lobe nodule seen on the previous CT have resolved in the interval. No new suspicious nodule or mass by CT imaging. ABDOMEN/PELVIS: No abnormal hypermetabolic activity within the  liver, pancreas, adrenal glands, or spleen. No hypermetabolic lymph nodes in the abdomen or pelvis. Incidental CT findings: Tiny nonobstructing stone noted interpolar right kidney with small nonobstructing stone or vascular calcification noted upper pole left kidney. There is moderate atherosclerotic calcification of the abdominal aorta without aneurysm. SKELETON: Uptake identified in the anterior fourth rib is associated with what appears to be in acute fracture (axial image 70/series 4). There is also focal uptake associated with the anterior left fifth rib (image 80/4) also associated with acute nondisplaced fracture. No findings to raise concern for hypermetabolic bony metastases. Incidental CT findings: none IMPRESSION: 1. Hypermetabolic right hilar and right mediastinal lymphadenopathy, consistent with the patient's known neoplasm. No evidence for hypermetabolic metastatic disease in the neck, abdomen, or pelvis. 2. Cluster of small right upper lobe pulmonary nodule seen on the previous CT scan have nearly resolved in the interval. 3. Acute nondisplaced fractures of the anterior right fourth and left fifth ribs, showing low level hypermetabolism. No findings to suggest  definite hypermetabolic bony metastatic disease on today's study. 4.  Aortic Atherosclerois (ICD10-170.0) Electronically Signed   By: Misty Stanley M.D.   On: 10/20/2021 11:06   IR IMAGING GUIDED PORT INSERTION  Result Date: 10/20/2021 INDICATION: 66 year old female with history of lung cancer requiring central venous access for chemotherapy administration. EXAM: IMPLANTED PORT A CATH PLACEMENT WITH ULTRASOUND AND FLUOROSCOPIC GUIDANCE COMPARISON:  None. MEDICATIONS: None. ANESTHESIA/SEDATION: Moderate (conscious) sedation was employed during this procedure. A total of Versed 1 mg and Fentanyl 50 mcg was administered intravenously. Moderate Sedation Time: 14 minutes. The patient's level of consciousness and vital signs were monitored  continuously by radiology nursing throughout the procedure under my direct supervision. CONTRAST:  None FLUOROSCOPY TIME:  0 minutes, 14 seconds (0 mGy) COMPLICATIONS: None immediate. PROCEDURE: The procedure, risks, benefits, and alternatives were explained to the patient. Questions regarding the procedure were encouraged and answered. The patient understands and consents to the procedure. The right neck and chest were prepped with chlorhexidine in a sterile fashion, and a sterile drape was applied covering the operative field. Maximum barrier sterile technique with sterile gowns and gloves were used for the procedure. A timeout was performed prior to the initiation of the procedure. Ultrasound was used to examine the jugular vein which was compressible and free of internal echoes. A skin marker was used to demarcate the planned venotomy and port pocket incision sites. Local anesthesia was provided to these sites and the subcutaneous tunnel track with 1% lidocaine with 1:100,000 epinephrine. A small incision was created at the jugular access site and blunt dissection was performed of the subcutaneous tissues. Under ultrasound guidance, the jugular vein was accessed with a 21 ga micropuncture needle and an 0.018" wire was inserted to the superior vena cava. Real-time ultrasound guidance was utilized for vascular access including the acquisition of a permanent ultrasound image documenting patency of the accessed vessel. A 5 Fr micopuncture set was then used, through which a 0.035" Rosen wire was passed under fluoroscopic guidance into the inferior vena cava. An 8 Fr dilator was then placed over the wire. A subcutaneous port pocket was then created along the upper chest wall utilizing a combination of sharp and blunt dissection. The pocket was irrigated with sterile saline, packed with gauze, and observed for hemorrhage. A single lumen "ISP" sized power injectable port was chosen for placement. The 8 Fr catheter was  tunneled from the port pocket site to the venotomy incision. The port was placed in the pocket. The external catheter was trimmed to appropriate length. The dilator was exchanged for an 8 Fr peel-away sheath under fluoroscopic guidance. The catheter was then placed through the sheath and the sheath was removed. Final catheter positioning was confirmed and documented with a fluoroscopic spot radiograph. The port was accessed with a Huber needle, aspirated, and flushed with heparinized saline. The deep dermal layer of the port pocket incision was closed with interrupted 3-0 Vicryl suture. The skin was opposed with a running subcuticular 4-0 Monocryl suture. Dermabond was then placed over the port pocket and neck incisions. The patient tolerated the procedure well without immediate post procedural complication. FINDINGS: After catheter placement, the tip lies within the superior cavoatrial junction. The catheter aspirates and flushes normally and is ready for immediate use. IMPRESSION: Successful placement of a power injectable Port-A-Cath via the right internal jugular vein. The catheter is ready for immediate use. Ruthann Cancer, MD [facility] Radiology Electronically Signed   By: Glade Nurse.D.  On: 10/20/2021 15:52     ASSESSMENT/PLAN:   This is a very pleasant 66 year old white female recently diagnosed with limited stage (T3, N2, M0) small cell lung cancer presented with disease in the right upper lobe with right hilar and mediastinal lymphadenopathy . She was diagnosed December 2022.   She also was previously established with our clinic for iron deficiency anemia.  She receives as needed IV iron.  Her last iron infusion was on 02/07/2021.   The patient is status post 1 cycle of carboplatin 20 C5, etoposide 100 mg per metered square, and Imfinzi 1500 mg IV every 3 weeks.  Despite having multiple core morbidities, the patient tolerated treatment well  without any concerning adverse side effects except for mild nausea without vomiting.  This will be discontinued due to the patient's staging PET scan showing limited stage disease.   The patient recently had a PET scan performed.  The patient was seen with Dr. Julien Nordmann today.  Dr. Julien Nordmann personally and independently reviewed the scan discussed results with the patient today.  The scan did show hypermetabolic right hilar and right mediastinal lymphadenopathy consistent with the patient's known neoplasm.  There is no evidence of hypermetabolic disease in the neck, abdomen, or pelvis.  The adrenal lesion was not hypermetabolic on PET scan.  Therefore, it appears that the patient is limited stage small cell lung cancer.  Dr. Julien Nordmann discussed removing immunotherapy from the patient's treatment plan and referring the patient to radiation oncology for concurrent radiation.  I have placed a referral to radiation oncology.   Therefore, the patient's treatment will consist of carboplatin for an AUC of 5 on day 1 and etoposide 100 mg per metered squared on days 1, 2, and 3 IV every 3 weeks.  The patient is not a good candidate for cisplatin due to her multiple comorbidities.  We will see her back for follow-up visit in 2 weeks for evaluation before starting treatment with carboplatin and etoposide for cycle #2.  The patient's labs today show worsening microcytic anemia with a hemoglobin of 7.6.  This is likely secondary to her known iron deficiency anemia as well as her recent treatment with chemotherapy.  We will arrange for the patient to receive 2 units of blood on Wednesday, 10/25/2021.  I will also arrange for her to have repeat iron studies and ferritin drawn.  Since the patient has not had a blood transfusion before, we will draw ABO.  I will also arrange for the patient to receive IV iron infusion starting next week with Venofer 300 mg weekly x3. We discussed some of the risks, benefits, and alternatives of  blood transfusions. Discussed that the hemoglobin level getting low.   I sent prescription for Phenergan 12.5 mg to the patient's pharmacy for an antiemetic as Compazine and Zofran is not effective for her.  Advised her not to take Phenergan at the same time as Compazine.   I discussed with the patient that she did indeed have rib fractures after her fall last week.  She will continue using Tylenol and heating pads if needed.  I also discussed with her that it is acceptable to use Norco if needed for uncontrolled pain.  The patient was advised to call immediately if she has any concerning symptoms in the interval. The patient voices understanding of current disease status and treatment options and is in agreement with the current care plan. All questions were answered. The patient knows to call the clinic with any problems,  questions or concerns. We can certainly see the patient much sooner if necessary    Orders Placed This Encounter  Procedures   Iron and Iron Binding Capacity ([facility] only)    Standing Status:   Future    Number of Occurrences:   1    Standing Expiration Date:   10/23/2022   Ferritin    Standing Status:   Future    Number of Occurrences:   1    Standing Expiration Date:   10/23/2022   CBC with Differential ([place] Only)    Standing Status:   Standing    Number of Occurrences:   12    Standing Expiration Date:   10/23/2022   CMP ([place] only)    Standing Status:   Standing    Number of Occurrences:   12    Standing Expiration Date:   10/23/2022   Ambulatory referral to Radiation Oncology    Referral Priority:   Routine    Referral Type:   Consultation    Referral Reason:   Specialty Services Required    Requested Specialty:   Radiation Oncology    Number of Visits Requested:   1   Informed Consent Details: Physician/Practitioner Attestation; Transcribe to consent form and obtain patient signature    Standing Status:   Future    Standing Expiration  Date:   10/23/2022    Order Specific Question:   Physician/Practitioner attestation of informed consent for blood and or blood product transfusion    Answer:   I, the physician/practitioner, attest that I have discussed with the patient the benefits, risks, side effects, alternatives, likelihood of achieving goals and potential problems during recovery for the procedure that I have provided informed consent.    Order Specific Question:   Product(s)    Answer:   All Product(s)   Care order/instruction    Transfuse Parameters    Standing Status:   Future    Standing Expiration Date:   10/23/2022   ABO/RH    Standing Status:   Future    Number of Occurrences:   1    Standing Expiration Date:   10/23/2022   Type and screen         Standing Status:   Future    Standing Expiration Date:   10/23/2022   Sample to Blood Bank    Standing Status:   Standing    Number of Occurrences:   3    Standing Expiration Date:   10/23/2022      Tobe Sos Sewell Pitner, PA-C 10/23/21  ADDENDUM: Hematology/Oncology Attending: I had a face-to-face encounter with the patient today.  I reviewed her records, lab, scan and recommended her care plan.  This is a very pleasant 66 years old African-American female diagnosed with limited stage (T3, N2, M0) small cell lung cancer presented with right upper lobe lung mass and right hilar and mediastinal lymphadenopathy.  The patient was initially thought to have extensive stage disease with adrenal metastasis but she recently underwent a PET scan that showed no hypermetabolic activity in the adrenal gland lesion.  She already received 1 cycle of systemic chemotherapy with carboplatin, etoposide and Imfinzi. She tolerated the first cycle of her treatment fairly well started last week. I had a lengthy discussion with the patient today about her current condition and treatment options. I explained to the patient that with the current stage of her disease as limited stage  disease, she may benefit from a more aggressive course with  systemic chemotherapy in addition to concurrent curative radiotherapy. She is not a great candidate for treatment with cisplatin and she will be retreated with carboplatin for AUC of 5 on day 1 and 2 etoposide 100 Mg/M2 on days 1, 2 and 3 for 3 more cycles. We will refer the patient to radiation oncology for consideration of concurrent radiotherapy. The patient will start the next cycle of her treatment in 2 weeks. She was advised to call immediately if she has any other concerning symptoms in the interval. The total time spent in the appointment was 30 minutes. Disclaimer: This note was dictated with voice recognition software. Similar sounding words can inadvertently be transcribed and may be missed upon review.  Eilleen Kempf, MD 10/23/21

## 2021-10-20 NOTE — Procedures (Signed)
Interventional Radiology Procedure Note ° °Procedure: Single Lumen Power Port Placement   ° °Access:  Right internal jugular vein ° °Findings: Catheter tip positioned at cavoatrial junction. Port is ready for immediate use.  ° °Complications: None ° °EBL: < 10 mL ° °Recommendations:  °- Ok to shower in 24 hours °- Do not submerge for 7 days °- Routine line care  ° ° °Pelham Hennick, MD ° ° ° °

## 2021-10-23 ENCOUNTER — Other Ambulatory Visit: Payer: Self-pay

## 2021-10-23 ENCOUNTER — Other Ambulatory Visit: Payer: Medicare Other

## 2021-10-23 ENCOUNTER — Ambulatory Visit: Payer: Medicare Other | Admitting: Internal Medicine

## 2021-10-23 ENCOUNTER — Inpatient Hospital Stay: Payer: Medicare Other

## 2021-10-23 ENCOUNTER — Inpatient Hospital Stay (HOSPITAL_BASED_OUTPATIENT_CLINIC_OR_DEPARTMENT_OTHER): Payer: Medicare Other | Admitting: Physician Assistant

## 2021-10-23 ENCOUNTER — Other Ambulatory Visit: Payer: Self-pay | Admitting: Physician Assistant

## 2021-10-23 VITALS — BP 100/50 | HR 96 | Temp 96.7°F | Resp 18 | Wt 130.4 lb

## 2021-10-23 DIAGNOSIS — D6481 Anemia due to antineoplastic chemotherapy: Secondary | ICD-10-CM

## 2021-10-23 DIAGNOSIS — T451X5A Adverse effect of antineoplastic and immunosuppressive drugs, initial encounter: Secondary | ICD-10-CM | POA: Diagnosis not present

## 2021-10-23 DIAGNOSIS — C3490 Malignant neoplasm of unspecified part of unspecified bronchus or lung: Secondary | ICD-10-CM

## 2021-10-23 DIAGNOSIS — Z79899 Other long term (current) drug therapy: Secondary | ICD-10-CM | POA: Diagnosis not present

## 2021-10-23 DIAGNOSIS — C3411 Malignant neoplasm of upper lobe, right bronchus or lung: Secondary | ICD-10-CM | POA: Diagnosis not present

## 2021-10-23 DIAGNOSIS — Z95828 Presence of other vascular implants and grafts: Secondary | ICD-10-CM

## 2021-10-23 DIAGNOSIS — D509 Iron deficiency anemia, unspecified: Secondary | ICD-10-CM | POA: Diagnosis not present

## 2021-10-23 DIAGNOSIS — C7972 Secondary malignant neoplasm of left adrenal gland: Secondary | ICD-10-CM | POA: Diagnosis not present

## 2021-10-23 DIAGNOSIS — Z5111 Encounter for antineoplastic chemotherapy: Secondary | ICD-10-CM | POA: Diagnosis not present

## 2021-10-23 DIAGNOSIS — Z5112 Encounter for antineoplastic immunotherapy: Secondary | ICD-10-CM | POA: Diagnosis not present

## 2021-10-23 LAB — CMP (CANCER CENTER ONLY)
ALT: 12 U/L (ref 0–44)
AST: 15 U/L (ref 15–41)
Albumin: 4 g/dL (ref 3.5–5.0)
Alkaline Phosphatase: 83 U/L (ref 38–126)
Anion gap: 9 (ref 5–15)
BUN: 24 mg/dL — ABNORMAL HIGH (ref 8–23)
CO2: 28 mmol/L (ref 22–32)
Calcium: 9.3 mg/dL (ref 8.9–10.3)
Chloride: 93 mmol/L — ABNORMAL LOW (ref 98–111)
Creatinine: 0.96 mg/dL (ref 0.44–1.00)
GFR, Estimated: >60 mL/min (ref >60)
Glucose, Bld: 260 mg/dL — ABNORMAL HIGH (ref 70–99)
Potassium: 4.1 mmol/L (ref 3.5–5.1)
Sodium: 130 mmol/L — ABNORMAL LOW (ref 135–145)
Total Bilirubin: 0.4 mg/dL (ref 0.3–1.2)
Total Protein: 6.9 g/dL (ref 6.5–8.1)

## 2021-10-23 LAB — ABO/RH: ABO/RH(D): O POS

## 2021-10-23 LAB — CBC WITH DIFFERENTIAL (CANCER CENTER ONLY)
Abs Immature Granulocytes: 0.02 10*3/uL (ref 0.00–0.07)
Basophils Absolute: 0 10*3/uL (ref 0.0–0.1)
Basophils Relative: 1 %
Eosinophils Absolute: 0 10*3/uL (ref 0.0–0.5)
Eosinophils Relative: 1 %
HCT: 25.5 % — ABNORMAL LOW (ref 36.0–46.0)
Hemoglobin: 7.6 g/dL — ABNORMAL LOW (ref 12.0–15.0)
Immature Granulocytes: 0 %
Lymphocytes Relative: 15 %
Lymphs Abs: 0.7 10*3/uL (ref 0.7–4.0)
MCH: 22.1 pg — ABNORMAL LOW (ref 26.0–34.0)
MCHC: 29.8 g/dL — ABNORMAL LOW (ref 30.0–36.0)
MCV: 74.1 fL — ABNORMAL LOW (ref 80.0–100.0)
Monocytes Absolute: 0.1 10*3/uL (ref 0.1–1.0)
Monocytes Relative: 3 %
Neutro Abs: 3.7 10*3/uL (ref 1.7–7.7)
Neutrophils Relative %: 80 %
Platelet Count: 174 10*3/uL (ref 150–400)
RBC: 3.44 MIL/uL — ABNORMAL LOW (ref 3.87–5.11)
RDW: 16.7 % — ABNORMAL HIGH (ref 11.5–15.5)
WBC Count: 4.6 10*3/uL (ref 4.0–10.5)
nRBC: 0 % (ref 0.0–0.2)

## 2021-10-23 LAB — PREPARE RBC (CROSSMATCH)

## 2021-10-23 LAB — SAMPLE TO BLOOD BANK

## 2021-10-23 MED ORDER — SODIUM CHLORIDE 0.9% FLUSH
10.0000 mL | INTRAVENOUS | Status: AC | PRN
  Administered 2021-10-23: 10 mL

## 2021-10-23 MED ORDER — PROMETHAZINE HCL 12.5 MG PO TABS: 12.5000 mg | ORAL_TABLET | Freq: Four times a day (QID) | ORAL | 2 refills | Status: DC | PRN

## 2021-10-23 MED ORDER — HEPARIN SOD (PORK) LOCK FLUSH 100 UNIT/ML IV SOLN
500.0000 [IU] | Freq: Once | INTRAVENOUS | Status: AC
  Administered 2021-10-23: 500 [IU] via INTRAVENOUS

## 2021-10-23 MED ORDER — SODIUM CHLORIDE 0.9% FLUSH
10.0000 mL | Freq: Once | INTRAVENOUS | Status: AC
  Administered 2021-10-23: 10 mL via INTRAVENOUS

## 2021-10-23 MED ORDER — HEPARIN SOD (PORK) LOCK FLUSH 100 UNIT/ML IV SOLN
500.0000 [IU] | INTRAVENOUS | Status: AC | PRN
  Administered 2021-10-23: 500 [IU]

## 2021-10-23 NOTE — Progress Notes (Signed)
DISCONTINUE ON PATHWAY REGIMEN - Small Cell Lung     Cycles 1 through 4: A cycle is every 21 days:     Durvalumab      Carboplatin      Etoposide    Cycles 5 and beyond: A cycle is every 28 days:     Durvalumab   **Always confirm dose/schedule in your pharmacy ordering system**  REASON: Other Reason PRIOR TREATMENT: LOS420: Durvalumab 1,500 mg D1 + Carboplatin AUC=5 D1 + Etoposide 100 mg/m2 D1-3 q21 Days x 4 Cycles, Followed by Durvalumab 1,500 mg q28 Days Until Progression or Unacceptable Toxicity TREATMENT RESPONSE: Unable to Evaluate  START ON PATHWAY REGIMEN - Small Cell Lung     A cycle is every 21 days:     Carboplatin      Etoposide   **Always confirm dose/schedule in your pharmacy ordering system**  Patient Characteristics: Newly Diagnosed, Preoperative or Nonsurgical Candidate (Clinical Staging), First Line, Limited Stage, Nonsurgical Candidate Therapeutic Status: Newly Diagnosed, Preoperative or Nonsurgical Candidate (Clinical Staging) AJCC T Category: cT1c AJCC N Category: cN2 AJCC M Category: cM0 AJCC 8 Stage Grouping: IIIA Stage Classification: Limited Surgical Candidacy: Nonsurgical Candidate Intent of Therapy: Curative Intent, Discussed with Patient

## 2021-10-24 ENCOUNTER — Ambulatory Visit (INDEPENDENT_AMBULATORY_CARE_PROVIDER_SITE_OTHER): Payer: Medicare Other

## 2021-10-24 ENCOUNTER — Other Ambulatory Visit: Payer: Medicare Other

## 2021-10-24 ENCOUNTER — Telehealth: Payer: Self-pay | Admitting: Physician Assistant

## 2021-10-24 DIAGNOSIS — I255 Ischemic cardiomyopathy: Secondary | ICD-10-CM | POA: Diagnosis not present

## 2021-10-24 LAB — CUP PACEART REMOTE DEVICE CHECK
Battery Remaining Longevity: 162 mo
Battery Remaining Percentage: 100 %
Brady Statistic RV Percent Paced: 0 %
Date Time Interrogation Session: 20230124042400
HighPow Impedance: 81 Ohm
Implantable Lead Implant Date: 20211015
Implantable Lead Location: 753860
Implantable Lead Model: 672
Implantable Lead Serial Number: 166771
Implantable Pulse Generator Implant Date: 20211015
Lead Channel Impedance Value: 1028 Ohm
Lead Channel Setting Pacing Amplitude: 3.5 V
Lead Channel Setting Pacing Pulse Width: 0.4 ms
Lead Channel Setting Sensing Sensitivity: 0.5 mV
Pulse Gen Serial Number: 279742

## 2021-10-24 LAB — IRON AND IRON BINDING CAPACITY (CC-WL,HP ONLY)
Iron: 118 ug/dL (ref 28–170)
Saturation Ratios: 19 % (ref 10.4–31.8)
TIBC: 627 ug/dL — ABNORMAL HIGH (ref 250–450)
UIBC: 509 ug/dL — ABNORMAL HIGH (ref 148–442)

## 2021-10-24 LAB — FERRITIN: Ferritin: 62 ng/mL (ref 11–307)

## 2021-10-24 NOTE — Telephone Encounter (Signed)
I called the patient to follow up. Discussed I the results of the iron studies does not show that she needs IV iron. I will let scheduling know to not schedule these appointments; however, she does need to keep her appointment tomorrow for a blood transfusion. I also discussed radiation scheduling will call her soon to reschedule her consultation with Dr. Lisbeth Renshaw.

## 2021-10-25 ENCOUNTER — Inpatient Hospital Stay: Payer: Medicare Other

## 2021-10-25 ENCOUNTER — Other Ambulatory Visit: Payer: Self-pay

## 2021-10-25 DIAGNOSIS — Z79899 Other long term (current) drug therapy: Secondary | ICD-10-CM | POA: Diagnosis not present

## 2021-10-25 DIAGNOSIS — D509 Iron deficiency anemia, unspecified: Secondary | ICD-10-CM | POA: Diagnosis not present

## 2021-10-25 DIAGNOSIS — Z5111 Encounter for antineoplastic chemotherapy: Secondary | ICD-10-CM | POA: Diagnosis not present

## 2021-10-25 DIAGNOSIS — C3411 Malignant neoplasm of upper lobe, right bronchus or lung: Secondary | ICD-10-CM | POA: Diagnosis not present

## 2021-10-25 DIAGNOSIS — T451X5A Adverse effect of antineoplastic and immunosuppressive drugs, initial encounter: Secondary | ICD-10-CM

## 2021-10-25 DIAGNOSIS — Z5112 Encounter for antineoplastic immunotherapy: Secondary | ICD-10-CM | POA: Diagnosis not present

## 2021-10-25 DIAGNOSIS — C7972 Secondary malignant neoplasm of left adrenal gland: Secondary | ICD-10-CM | POA: Diagnosis not present

## 2021-10-25 MED ORDER — SODIUM CHLORIDE 0.9% IV SOLUTION
250.0000 mL | Freq: Once | INTRAVENOUS | Status: AC
  Administered 2021-10-25: 13:00:00 250 mL via INTRAVENOUS

## 2021-10-25 MED ORDER — ACETAMINOPHEN 325 MG PO TABS
650.0000 mg | ORAL_TABLET | Freq: Once | ORAL | Status: AC
  Administered 2021-10-25: 13:00:00 650 mg via ORAL
  Filled 2021-10-25: qty 2

## 2021-10-25 NOTE — Patient Instructions (Signed)
Blood Transfusion, Adult A blood transfusion is a procedure in which you receive blood or a type of blood cell (blood component) through an IV. You may need a blood transfusion when your blood level is low. This may result from a bleeding disorder, illness, injury, or surgery. The blood may come from a donor. You may also be able to donate blood for yourself (autologous blood donation) before a planned surgery. The blood given in a transfusion is made up of different blood components. You may receive: Red blood cells. These carry oxygen to the cells in the body. Platelets. These help your blood to clot. Plasma. This is the liquid part of your blood. It carries proteins and other substances throughout the body. White blood cells. These help you fight infections. If you have hemophilia or another clotting disorder, you may also receive other types of blood products. Tell a health care provider about: Any blood disorders you have. Any previous reactions you have had during a blood transfusion. Any allergies you have. All medicines you are taking, including vitamins, herbs, eye drops, creams, and over-the-counter medicines. Any surgeries you have had. Any medical conditions you have, including any recent fever or cold symptoms. Whether you are pregnant or may be pregnant. What are the risks? Generally, this is a safe procedure. However, problems may occur. The most common problems include: A mild allergic reaction, such as red, swollen areas of skin (hives) and itching. Fever or chills. This may be the body's response to new blood cells received. This may occur during or up to 4 hours after the transfusion. More serious problems may include: Transfusion-associated circulatory overload (TACO), or too much fluid in the lungs. This may cause breathing problems. A serious allergic reaction, such as difficulty breathing or swelling around the face and lips. Transfusion-related acute lung injury  (TRALI), which causes breathing difficulty and low oxygen in the blood. This can occur within hours of the transfusion or several days later. Iron overload. This can happen after receiving many blood transfusions over a period of time. Infection or virus being transmitted. This is rare because donated blood is carefully tested before it is given. Hemolytic transfusion reaction. This is rare. It happens when your body's defense system (immune system)tries to attack the new blood cells. Symptoms may include fever, chills, nausea, low blood pressure, and low back or chest pain. Transfusion-associated graft-versus-host disease (TAGVHD). This is rare. It happens when donated cells attack your body's healthy tissues. What happens before the procedure? Medicines Ask your health care provider about: Changing or stopping your regular medicines. This is especially important if you are taking diabetes medicines or blood thinners. Taking medicines such as aspirin and ibuprofen. These medicines can thin your blood. Do not take these medicines unless your health care provider tells you to take them. Taking over-the-counter medicines, vitamins, herbs, and supplements. General instructions Follow instructions from your health care provider about eating and drinking restrictions. You will have a blood test to determine your blood type. This is necessary to know what kind of blood your body will accept and to match it to the donor blood. If you are going to have a planned surgery, you may be able to do an autologous blood donation. This may be done in case you need to have a transfusion. You will have your temperature, blood pressure, and pulse monitored before the transfusion. If you have had an allergic reaction to a transfusion in the past, you may be given medicine to help prevent   a reaction. This medicine may be given to you by mouth (orally) or through an IV. Set aside time for the blood transfusion. This  procedure generally takes 1-4 hours to complete. What happens during the procedure?  An IV will be inserted into one of your veins. The bag of donated blood will be attached to your IV. The blood will then enter through your vein. Your temperature, blood pressure, and pulse will be monitored regularly during the transfusion. This monitoring is done to detect early signs of a transfusion reaction. Tell your nurse right away if you have any of these symptoms during the transfusion: Shortness of breath or trouble breathing. Chest or back pain. Fever or chills. Hives or itching. If you have any signs or symptoms of a reaction, your transfusion will be stopped and you may be given medicine. When the transfusion is complete, your IV will be removed. Pressure may be applied to the IV site for a few minutes. A bandage (dressing)will be applied. The procedure may vary among health care providers and hospitals. What happens after the procedure? Your temperature, blood pressure, pulse, breathing rate, and blood oxygen level will be monitored until you leave the hospital or clinic. Your blood may be tested to see how you are responding to the transfusion. You may be warmed with fluids or blankets to maintain a normal body temperature. If you receive your blood transfusion in an outpatient setting, you will be told whom to contact to report any reactions. Where to find more information For more information on blood transfusions, visit the [employer]: redcross.org Summary A blood transfusion is a procedure in which you receive blood or a type of blood cell (blood component) through an IV. The blood you receive may come from a donor or be donated by yourself (autologous blood donation) before a planned surgery. The blood given in a transfusion is made up of different blood components. You may receive red blood cells, platelets, plasma, or white blood cells depending on the condition treated. Your  temperature, blood pressure, and pulse will be monitored before, during, and after the transfusion. After the transfusion, your blood may be tested to see how your body has responded. This information is not intended to replace advice given to you by your health care provider. Make sure you discuss any questions you have with your health care provider. Document Revised: 07/23/2019 Document Reviewed: 03/12/2019 Elsevier Patient Education  2022 [employer]  

## 2021-10-26 LAB — BPAM RBC
Blood Product Expiration Date: 202302212359
Blood Product Expiration Date: 202302212359
ISSUE DATE / TIME: 202301251256
ISSUE DATE / TIME: 202301251256
Unit Type and Rh: 5100
Unit Type and Rh: 5100

## 2021-10-26 LAB — TYPE AND SCREEN
ABO/RH(D): O POS
Antibody Screen: NEGATIVE
Unit division: 0
Unit division: 0

## 2021-10-26 NOTE — Progress Notes (Signed)
Location of tumor and Histology per Pathology Report: RUL lung  Biopsy:  A. LUNG, RUL TARGET#3, BRUSH:  - No malignant cells identified   B. LUNG, RUL TARGET#3, FINE NEEDLE ASPIRATION:  - Malignant cells present, consistent with small cell carcinoma  - See comment   C. LUNG, RUL TARGET#2, FINE NEEDLE ASPIRATION:  - Malignant cells present, consistent with small cell carcinoma   D. LUNG, RUL TARGET#2, BRUSH:  - Malignant cells present, consistent with small cell carcinoma   E. LYMPH NODE, 10R, FINE NEEDLE ASPIRATION:  - Malignant cells present, consistent with small cell carcinoma  Past/Anticipated interventions by surgeon, if any:   Surgeon: Baltazar Apo Operation: Flexible video fiberoptic bronchoscopy with robotic assistance and biopsies.  Past/Anticipated interventions by medical oncology, if any: Dr Julien Nordmann palliative systemic chemotherapy with carboplatin for AUC of 5 on day 1, etoposide 100 Mg/M2 on days 1, 2 and 3 with Cosela 240 Mg/M2 before the chemotherapy as well as Imfinzi 1500 Mg IV on day 1 every 3 weeks during the course of chemotherapy followed by maintenance Imfinzi 1500 Mg IV every 4 weeks if the patient has no evidence of disease progression after the induction phase.    Pain issues, if any:  yes, bilateral rib pain from broken ribs during a fall rated 3/10 constant and aching  SAFETY ISSUES: Prior radiation? no Pacemaker/ICD? yes Possible current pregnancy? no Is the patient on methotrexate? no  Current Complaints / other details: anemia, low iron     Vitals:   11/01/21 1333  BP: 106/62  Pulse: 82  Resp: 18  Temp: (!) 97 F (36.1 C)  SpO2: 100%  Weight: 132 lb (59.9 kg)  Height: 4\' 9"  (1.448 m)

## 2021-10-27 ENCOUNTER — Telehealth: Payer: Self-pay | Admitting: Internal Medicine

## 2021-10-27 NOTE — Telephone Encounter (Signed)
Scheduled per 01/25 los, patient has been called and notified of upcoming appointments.

## 2021-10-31 ENCOUNTER — Ambulatory Visit: Payer: Medicare Other | Admitting: Pulmonary Disease

## 2021-10-31 ENCOUNTER — Inpatient Hospital Stay: Payer: Medicare Other

## 2021-10-31 ENCOUNTER — Other Ambulatory Visit: Payer: Medicare Other

## 2021-10-31 DIAGNOSIS — J449 Chronic obstructive pulmonary disease, unspecified: Secondary | ICD-10-CM | POA: Diagnosis not present

## 2021-10-31 DIAGNOSIS — I5042 Chronic combined systolic (congestive) and diastolic (congestive) heart failure: Secondary | ICD-10-CM | POA: Diagnosis not present

## 2021-10-31 DIAGNOSIS — E119 Type 2 diabetes mellitus without complications: Secondary | ICD-10-CM | POA: Diagnosis not present

## 2021-10-31 NOTE — Progress Notes (Signed)
Radiation Oncology         [phone] ________________________________  Initial Outpatient Consultation  Name: Diane [name] MRN: [id number]  Date: 11/01/2021  DOB: 1956-03-21  CC:Tower, [name], MD  Curt Bears, MD   REFERRING PHYSICIAN: Curt Bears, MD  DIAGNOSIS: The primary encounter diagnosis was Malignant neoplasm of upper lobe of right lung ([place]). A diagnosis of Malignant neoplasm of unspecified part of unspecified bronchus or lung ([place]) was also pertinent to this visit.  Small cell carcinoma of the RUL, with lymph node metastasis, limited stage  HISTORY OF PRESENT ILLNESS::Diane [name] is a 66 y.o. female smoker with a history significant for chronic bronchitis, emphysema and sleep related hypoxia. [name] she is seen as a courtesy of Dr. Julien Nordmann for an opinion concerning radiation therapy as part of management for her recently diagnosed right lung cancer.   The patient is followed by Dr. Halford Chessman in regards to her COPD and Langerhans' cell histiocytosis. Routine follow up chest CT on 09/08/21 showed a cluster of right upper lobe pulmonary nodules, the largest measured 1.5 x 1.1 cm, and associated bulky right hilar and right paratracheal lymphadenopathy concerning for primary bronchogenic malignancy. A new left adrenal nodule was also appreciated, measuring 2.0 x 1.9 cm, and concerning for potential metastatic disease.    Subsequently, the patient was referred to Dr. Lamonte Sakai for bronchoscopy, RUL biopsies, and nodal biopsies on 09/18/21. Pathology revealed malignant cells consistent with small cell carcinoma of the RUL, with metastasis to one lymph node (10R).   Accordingly, the patient was referred to Dr. Julien Nordmann on 10/04/21 for further evaluation and treatment recommendations. During this visit, the patient reported feeling tired and fatigued, in addition to low back pain and right hip pain.  She also reported intermittent headaches, chest pain, shortness of breath with exertion,  nausea without emesis, and a mild cough. She denied having any recent weight loss, hemoptysis, or night sweats. In terms of treatment options, Dr. Julien Nordmann recommended palliative systemic chemotherapy consisting of carboplatin, etoposide, with Cosela before  chemotherapy, as well as Imfinzi on day 1 every 3 weeks, followed by maintenance Imfinzi every 4 weeks if the patient has no evidence of disease progression after the induction phase. First dose on 10/16/21. The patient has been tolerating treatments well other than nausea (without emesis).  These recommendations were made when the patient was initially felt to have extensive stage small cell lung cancer.  Of note: On 10/15/2021, the patient reported that she fell at home resulting in bruising on her extremities as well as on her right jaw . She is on Plavix and aspirin and denied hitting her head or any loss of consciousness.  She also has been having some soreness in the ribs.   PET on 10/19/21 demonstrated the hypermetabolic right hilar and right mediastinal lymphadenopathy consistent with the patient's known neoplasm. No evidence of hypermetabolic metastatic disease in the neck, abdomen, or pelvis were appreciated. PET also showed near complete resolutions of the previously seen cluster of small right upper lobe pulmonary nodules.  CT of the head on 10/19/21 showed no evidence of intracranial metastatic disease. The patient has a defibrillator placed which makes her non-eligible for MRI imaging.  Of note: the patient has a smoking history of 2.5 pack/day for around 54 years, and is now down to smoking around half a pack per day.    With staging work-up showing limited stage small cell lung cancer she is now referred to radiation oncology for consideration for chest  radiation treatments.   PREVIOUS RADIATION THERAPY: No  PAST MEDICAL HISTORY:  Past Medical History:  Diagnosis Date   Anemia    Arthritis    Carotid stenosis    a. s/p right  carotid stent 10/2017.   Chronic systolic CHF (congestive heart failure) (HCC)    Colon polyps    COPD (chronic obstructive pulmonary disease) (HCC)    Coronary artery disease    post CABG in 3/07 , coronary stents    Depression    after major stroke years ago, no longer treated   Diabetes mellitus    type 2   Dyslipidemia    Fatty liver    Fibromyalgia    GERD (gastroesophageal reflux disease)    Headache    hx of    History of kidney stones    Hyperlipidemia    Hypertension    Myocardial infarction ([place])    Pneumonia    hx of    Seizures ([place])    last seizure- 03/2013    Shortness of breath dyspnea    with exertion or when fluid builds up    Sleep apnea    used to wear a cpap- not used in 3 years    Status post dilation of esophageal narrowing    Stroke [facility]) 1993   problems with balance    Systolic murmur    known mild AS and MR   Thyroid goiter    Tobacco abuse     PAST SURGICAL HISTORY: Past Surgical History:  Procedure Laterality Date   ABDOMINAL AORTOGRAM W/LOWER EXTREMITY N/A 04/18/2020   Procedure: ABDOMINAL AORTOGRAM W/LOWER EXTREMITY;  Surgeon: Lorretta Harp, MD;  Location: [place] [facility];  Service: Cardiovascular;  Laterality: N/A;   APPENDECTOMY     BIOPSY  02/03/2019   Procedure: BIOPSY;  Surgeon: Ladene Artist, MD;  Location: [facility];  Service: Endoscopy;;   BREAST BIOPSY Left 2016   BRONCHIAL BIOPSY  09/18/2021   Procedure: BRONCHIAL BIOPSIES;  Surgeon: Collene Gobble, MD;  Location[facility] ENDOSCOPY;  Service: Pulmonary;;   BRONCHIAL BRUSHINGS  09/18/2021   Procedure: BRONCHIAL BRUSHINGS;  Surgeon: Collene Gobble, MD;  Location: [facility] ENDOSCOPY;  Service: Pulmonary;;   BRONCHIAL NEEDLE ASPIRATION BIOPSY  09/18/2021   Procedure: BRONCHIAL NEEDLE ASPIRATION BIOPSIES;  Surgeon: Collene Gobble, MD;  Location: [facility] ENDOSCOPY;  Service: Pulmonary;;   CARDIAC CATHETERIZATION  11/29/05   EF of 55%   CARDIAC CATHETERIZATION  08/06/06   EF of 45-50%    CARDIAC CATHETERIZATION N/A 05/06/2015   Procedure: Right/Left Heart Cath and Coronary/Graft Angiography;  Surgeon: Peter M Martinique, MD;  Location: [place] [facility];  Service: Cardiovascular;  Laterality: N/A;   CAROTID PTA/STENT INTERVENTION N/A 10/10/2017   Procedure: CAROTID PTA/STENT INTERVENTION - Right;  Surgeon: Serafina Mitchell, MD;  Location: [place] [facility];  Service: Cardiovascular;  Laterality: N/A;   CERVICAL FUSION  1990   CHOLECYSTECTOMY     COLON RESECTION     mass removed and 4 in of colon   COLONOSCOPY WITH PROPOFOL N/A 07/16/2017   Procedure: COLONOSCOPY WITH PROPOFOL;  Surgeon: Ladene Artist, MD;  Location: [facility];  Service: Endoscopy;  Laterality: N/A;   CORONARY ARTERY BYPASS GRAFT  12/04/2005   x5 -- left internal mammary artery to the LAD, left radial artery to the ramus intermedius, saphenous vein graft to the obtuse marginal 1, sequential saphenous vein grat to the acute marginal and posterior descending, endoscopic vein harvesting from the  left thigh with open vein harvest from right leg   CORONARY STENT PLACEMENT  08/11/06   PCI of her ciurcumflex/OM vessel   ESOPHAGOGASTRODUODENOSCOPY (EGD) WITH PROPOFOL N/A 02/03/2019   Procedure: ESOPHAGOGASTRODUODENOSCOPY (EGD) WITH PROPOFOL;  Surgeon: Ladene Artist, MD;  Location: [facility] ENDOS[facility];  Service: Endoscopy;  Laterality: N/A;   FINE NEEDLE ASPIRATION  09/18/2021   Procedure: FINE NEEDLE ASPIRATION (FNA) LINEAR;  Surgeon: Collene Gobble, MD;  Location: [facility] ENDOSCOPY;  Service: Pulmonary;;   ICD IMPLANT N/A 07/15/2020   Procedure: ICD IMPLANT;  Surgeon: Vickie Epley, MD;  Location: [place] [facility];  Service: Cardiovascular;  Laterality: N/A;   IR IMAGING GUIDED PORT INSERTION  10/20/2021   LAPAROTOMY Bilateral 05/19/2015   Procedure: EXPLORATORY LAPAROTOMY WITH BILATERAL SALPINGO OOPHORECTOMY /OMENTECTOMY/SEGMENTAL SIGMOID COLECTOMY ;  Surgeon: Everitt Amber, MD;  Location: [facility] OR[facility];  Service: Gynecology;   Laterality: Bilateral;   PERIPHERAL VASCULAR BALLOON ANGIOPLASTY  04/18/2020   Procedure: PERIPHERAL VASCULAR BALLOON ANGIOPLASTY;  Surgeon: Lorretta Harp, MD;  Location: [place] [facility];  Service: Cardiovascular;;  rt SFA  atherectomy and DCB   PERIPHERAL VASCULAR CATHETERIZATION N/A 11/15/2015   Procedure: Carotid PTA/Stent Intervention;  Surgeon: Lorretta Harp, MD;  Location: [place] [facility];  Service: Cardiovascular;  Laterality: N/A;   PERIPHERAL VASCULAR CATHETERIZATION  11/15/2015   Procedure: Carotid Angiography;  Surgeon: Lorretta Harp, MD;  Location: [name] [facility];  Service: Cardiovascular;;   RIGHT HEART CATH N/A 01/27/2020   Procedure: RIGHT HEART CATH;  Surgeon: Larey Dresser, MD;  Location: [place] [facility];  Service: Cardiovascular;  Laterality: N/A;   VIDEO BRONCHOSCOPY WITH ENDOBRONCHIAL ULTRASOUND N/A 09/18/2021   Procedure: VIDEO BRONCHOSCOPY WITH ENDOBRONCHIAL ULTRASOUND;  Surgeon: Collene Gobble, MD;  Location: [place] ENDOSCOPY;  Service: Pulmonary;  Laterality: N/A;   VIDEO BRONCHOSCOPY WITH RADIAL ENDOBRONCHIAL ULTRASOUND  09/18/2021   Procedure: RADIAL ENDOBRONCHIAL ULTRASOUND;  Surgeon: Collene Gobble, MD;  Location: [facility] ENDOSCOPY;  Service: Pulmonary;;    FAMILY HISTORY:  Family History  Problem Relation Age of Onset   Heart disease Mother    Diabetes Mother    COPD Mother    Hyperlipidemia Mother    Hypertension Mother    Cancer Father        met, origin unknown   Heart attack Father    Drug abuse Paternal Grandmother    Stroke Paternal Grandfather    Lung cancer Paternal Aunt        lung with mets to brain   Melanoma Paternal Uncle    Lung cancer Paternal Uncle        lung/liver to brain   Cancer Paternal Uncle        cancer of unknown type   Heart disease Maternal Grandfather    Hypertension Sister    Cancer Sister        eyelid   Glaucoma Sister    Heart disease Maternal Aunt        x 2 aunts    SOCIAL HISTORY:  Social  History   Tobacco Use   Smoking status: Every Day    Packs/day: 1.00    Years: 51.00    Pack years: 51.00    Types: Cigarettes    Start date: 52    Last attempt to quit: 02/22/2020    Years since quitting: 1.6   Smokeless tobacco: Never   Tobacco comments:    Pt smokes a 1 pack a day 08/30/2021 / pl  Vaping Use   Vaping Use: Former  Substance Use Topics   Alcohol use: No   Drug use: No    ALLERGIES:  Allergies  Allergen Reactions   Bee Venom Itching and Swelling   Benadryl [Diphenhydramine] Other (See Comments)    Insomnia    Entresto [Sacubitril-Valsartan] Cough   Farxiga [Dapagliflozin]     Yeast infection   Tetracycline     Unknown, pt cannot recall exact reaction   Canagliflozin Other (See Comments)   Sitagliptin Other (See Comments)   Ace Inhibitors Cough    MEDICATIONS:  Current Outpatient Medications  Medication Sig Dispense Refill   acetaminophen (TYLENOL) 500 MG tablet Take 1,000 mg by mouth at bedtime.     albuterol (PROVENTIL) (2.5 MG/3ML) 0.083% nebulizer solution Take 3 mLs (2.5 mg total) by nebulization every 6 (six) hours as needed for wheezing or shortness of breath. 1180 mL 3   albuterol (VENTOLIN HFA) 108 (90 Base) MCG/ACT inhaler Inhale 2 puffs into the lungs every 6 (six) hours as needed for wheezing or shortness of breath. 24 g 3   aspirin EC 81 MG tablet Take 81 mg by mouth every evening.  30 tablet 6   BD [name] NEEDLE NANO 2ND GEN 32G X 4 MM MISC Inject into the skin 3 (three) times daily.     clopidogrel (PLAVIX) 75 MG tablet Take 1 tablet (75 mg total) by mouth daily. 90 tablet 3   dexlansoprazole (DEXILANT) 60 MG capsule Take 1 capsule (60 mg total) by mouth every evening.     digoxin (LANOXIN) 0.125 MG tablet Take 0.5 tablets (0.0625 mg total) by mouth daily. 45 tablet 3   diphenhydrAMINE (BENADRYL) 25 mg capsule Take 25 mg by mouth 2 (two) times daily as needed for itching or allergies.     ezetimibe (ZETIA) 10 MG tablet Take 1 tablet (10 mg  total) by mouth daily. 90 tablet 3   fluticasone (FLONASE) 50 MCG/ACT nasal spray Place 2 sprays into both nostrils daily. (Patient taking differently: Place 2 sprays into both nostrils daily as needed for allergies.) 48 g 3   gabapentin (NEURONTIN) 300 MG capsule Take 2 capsules (600 mg total) by mouth at bedtime. TAKE 2 CAPSULES BY MOUTH EVERY DAY AT BEDTIME     glucose blood (ONETOUCH ULTRA) test strip 3 (three) times daily.     guaiFENesin (MUCINEX) 600 MG 12 hr tablet Take 2 tablets (1,200 mg total) by mouth 2 (two) times daily. 360 tablet 3   HYDROcodone-acetaminophen (NORCO) 5-325 MG tablet Take 1 tablet by mouth daily as needed for severe pain. 15 tablet 0   icosapent Ethyl (VASCEPA) 1 g capsule Take 2 capsules (2 g total) by mouth 2 (two) times daily. 360 capsule 3   insulin aspart protamine- aspart (NOVOLOG MIX 70/30) (70-30) 100 UNIT/ML injection Inject 5-65 Units into the skin See admin instructions. 65 units in the Am with breakfast and 5 units with lunch and 55 units with dinner     levETIRAcetam (KEPPRA) 1000 MG tablet Take 1 tablet (1,000 mg total) by mouth 2 (two) times daily.     metFORMIN (GLUCOPHAGE-XR) 500 MG 24 hr tablet Take 500 mg by mouth every evening.   6   metolazone (ZAROXOLYN) 2.5 MG tablet Take 1 tablet (2.5 mg total) by mouth 2 (two) times a week. Every Mon and Fri 30 tablet 3   nicotine (NICODERM CQ - DOSED IN MG/24 HOURS) 21 mg/24hr patch Place 21 mg onto the skin daily as needed (  to cease smoking).     nicotine polacrilex (NICORETTE) 4 MG gum Take 4 mg by mouth daily as needed for smoking cessation.     nitroGLYCERIN (NITROLINGUAL) 0.4 MG/SPRAY spray Place 1 spray under the tongue every 5 (five) minutes x 3 doses as needed for chest pain. 12 g 3   nystatin (MYCOSTATIN/NYSTOP) powder APPLY 1 APPLICATION        TOPICALLY 4 TIMES A DAY AS NEEDED FOR YEAST INFECTIONS 60 g 5   ONE TOUCH ULTRA TEST test strip USE AS DIRECTED FOR TESTING BLOOD GLUCOSE 3 TIMES DAILY  1    polyethylene glycol (MIRALAX / GLYCOLAX) 17 g packet Take 17 g by mouth daily.     potassium chloride SA (KLOR-CON M20) 20 MEQ tablet Take 2 tablets (40 mEq total) by mouth every morning AND 1 tablet (20 mEq total) every evening. 270 tablet 3   prochlorperazine (COMPAZINE) 10 MG tablet Take 1 tablet (10 mg total) by mouth every 6 (six) hours as needed for nausea or vomiting. 30 tablet 0   promethazine (PHENERGAN) 12.5 MG tablet Take 1 tablet (12.5 mg total) by mouth every 6 (six) hours as needed for nausea or vomiting. 30 tablet 2   ranolazine (RANEXA) 1000 MG SR tablet Take 1 tablet (1,000 mg total) by mouth 2 (two) times daily. 180 tablet 3   rosuvastatin (CRESTOR) 20 MG tablet Take 1 tablet (20 mg total) by mouth at bedtime.     sodium chloride (OCEAN) 0.65 % SOLN nasal spray Place 1 spray into both nostrils as needed for congestion.     spironolactone (ALDACTONE) 25 MG tablet Take 1 tablet (25 mg total) by mouth at bedtime. TAKE 1 TABLET BY MOUTH EVERYDAY AT BEDTIME     torsemide (DEMADEX) 20 MG tablet Take 3 tablets (60 mg total) by mouth 2 (two) times daily. 540 tablet 3   triamcinolone cream (KENALOG) 0.5 % Apply 1 application topically 3 (three) times daily as needed (rash). To affected areas     Vericiguat 10 MG TABS Take 10 mg by mouth daily.     lidocaine-prilocaine (EMLA) cream Apply to the Port-A-Cath site 30-60 minutes before chemotherapy (Patient not taking: Reported on 11/01/2021) 30 g 0   No current facility-administered medications for this encounter.    REVIEW OF SYSTEMS:  A 10+ POINT REVIEW OF SYSTEMS WAS OBTAINED including neurology, dermatology, psychiatry, cardiac, respiratory, lymph, extremities, GI, GU, musculoskeletal, constitutional, reproductive, HEENT.  She denies any headaches or visual issues.  She reports some soreness in the chest from her recent fall and associated rib fractures.  She denies any significant cough or hemoptysis.  She denies any consistent pain within  the chest other than associated with her rib fractures.  She reports having oxygen at night to sleep but does not use this presently.  She does not use oxygen during the daytime.   PHYSICAL EXAM:  height is '4\' 9"'  (1.448 m) and weight is 132 lb (59.9 kg). Her temperature is 97 F (36.1 C) (abnormal). Her blood pressure is 106/62 and her pulse is 82. Her respiration is 18 and oxygen saturation is 100%.   General: Alert and oriented, in no acute distress, ambulates with the assistance of a walker HEENT: Head is normocephalic. Extraocular movements are intact.  Neck: Neck is supple, no palpable cervical or supraclavicular lymphadenopathy. Heart: Regular in rate and rhythm with no murmurs, rubs, or gallops. Chest: Clear to auscultation bilaterally, with no rhonchi, wheezes, or rales.  Cardiac defibrillator/pacemaker in  the left upper chest. Abdomen: Soft, nontender, nondistended, with no rigidity or guarding. Extremities: No cyanosis or edema. Lymphatics: see Neck Exam Skin: No concerning lesions. Musculoskeletal: symmetric strength and muscle tone throughout. Neurologic: Cranial nerves II through XII are grossly intact. No obvious focalities. Speech is fluent. Coordination is intact. Psychiatric: Judgment and insight are intact. Affect is appropriate.   ECOG = 1  0 - Asymptomatic (Fully active, able to carry on all predisease activities without restriction)  1 - Symptomatic but completely ambulatory (Restricted in physically strenuous activity but ambulatory and able to carry out work of a light or sedentary nature. For example, light housework, office work)  2 - Symptomatic, <50% in bed during the day (Ambulatory and capable of all self care but unable to carry out any work activities. Up and about more than 50% of waking hours)  3 - Symptomatic, >50% in bed, but not bedbound (Capable of only limited self-care, confined to bed or chair 50% or more of waking hours)  4 - Bedbound (Completely  disabled. Cannot carry on any self-care. Totally confined to bed or chair)  5 - Death   Diane [name] MM, Creech RH, Tormey DC, et al. [phone]). "Toxicity and response criteria of the [facility]". [place] Oncol. 5 (6): 649-55  LABORATORY DATA:  Lab Results  Component Value Date   WBC 4.6 10/23/2021   HGB 7.6 (L) 10/23/2021   HCT 25.5 (L) 10/23/2021   MCV 74.1 (L) 10/23/2021   PLT 174 10/23/2021   NEUTROABS 3.7 10/23/2021   Lab Results  Component Value Date   NA 130 (L) 10/23/2021   K 4.1 10/23/2021   CL 93 (L) 10/23/2021   CO2 28 10/23/2021   GLUCOSE 260 (H) 10/23/2021   CREATININE 0.96 10/23/2021   CALCIUM 9.3 10/23/2021      RADIOGRAPHY: CT HEAD W & WO CONTRAST (5MM)  Result Date: 10/20/2021 CLINICAL DATA:  Small cell lung cancer staging.  Seizures EXAM: CT HEAD WITHOUT AND WITH CONTRAST TECHNIQUE: Contiguous axial images were obtained from the base of the skull through the vertex without and with intravenous contrast. RADIATION DOSE REDUCTION: This exam was performed according to the departmental dose-optimization program which includes automated exposure control, adjustment of the mA and/or kV according to patient size and/or use of iterative reconstruction technique. CONTRAST:  72m OMNIPAQUE IOHEXOL 300 MG/ML  SOLN COMPARISON:  Head CT 10/10/2017 FINDINGS: Brain: No swelling or enhancement to suggest metastatic disease. Moderate to large remote right parietal and posterior temporal infarct with diffuse cortical involvement in this patient with history of seizure. Age normal brain volume and white matter appearance. No hydrocephalus or collection Vascular: Major vessels are enhancing Skull: Negative Sinuses/Orbits: Negative IMPRESSION: 1. No evidence of metastatic disease to the brain. 2. Moderate to large remote right MCA branch infarct which involves cortex in this patient with history of seizure. Electronically Signed   By: JJorje GuildM.D.   On: 10/20/2021  08:25   NM PET Image Initial (PI) Skull Base To Thigh (F-18 FDG)  Result Date: 10/20/2021 CLINICAL DATA:  Initial treatment strategy for small-cell lung cancer. EXAM: NUCLEAR MEDICINE PET SKULL BASE TO THIGH TECHNIQUE: 7.2 mCi F-18 FDG was injected intravenously. Full-ring PET imaging was performed from the skull base to thigh after the radiotracer. CT data was obtained and used for attenuation correction and anatomic localization. Fasting blood glucose: 99 mg/dl COMPARISON:  Chest CT 09/08/2021 FINDINGS: Mediastinal blood pool activity: SUV max 2.3 Liver activity: SUV max  NA NECK: No hypermetabolic lymph nodes in the neck. Incidental CT findings: none CHEST: Bulky right paratracheal lymphadenopathy is markedly hypermetabolic with SUV max = 8.9. Patient's right hilar lymphadenopathy is also hypermetabolic with SUV max = 9.0 The right upper lobe pulmonary nodule seen on previous CT of 09/08/2021 have almost completely resolved in the interval. No hypermetabolism in this region on PET imaging today. Incidental CT findings: Coronary artery calcification is evident. Moderate atherosclerotic calcification is noted in the wall of the thoracic aorta. Status post CABG. 5 mm right upper lobe nodule on 57/4 is stable in the interval. Adjacent additional right upper lobe nodule seen on the previous CT have resolved in the interval. No new suspicious nodule or mass by CT imaging. ABDOMEN/PELVIS: No abnormal hypermetabolic activity within the liver, pancreas, adrenal glands, or spleen. No hypermetabolic lymph nodes in the abdomen or pelvis. Incidental CT findings: Tiny nonobstructing stone noted interpolar right kidney with small nonobstructing stone or vascular calcification noted upper pole left kidney. There is moderate atherosclerotic calcification of the abdominal aorta without aneurysm. SKELETON: Uptake identified in the anterior fourth rib is associated with what appears to be in acute fracture (axial image 70/series  4). There is also focal uptake associated with the anterior left fifth rib (image 80/4) also associated with acute nondisplaced fracture. No findings to raise concern for hypermetabolic bony metastases. Incidental CT findings: none IMPRESSION: 1. Hypermetabolic right hilar and right mediastinal lymphadenopathy, consistent with the patient's known neoplasm. No evidence for hypermetabolic metastatic disease in the neck, abdomen, or pelvis. 2. Cluster of small right upper lobe pulmonary nodule seen on the previous CT scan have nearly resolved in the interval. 3. Acute nondisplaced fractures of the anterior right fourth and left fifth ribs, showing low level hypermetabolism. No findings to suggest definite hypermetabolic bony metastatic disease on today's study. 4.  Aortic Atherosclerois (ICD10-170.0) Electronically Signed   By: Misty Stanley M.D.   On: 10/20/2021 11:06   CUP PACEART REMOTE DEVICE CHECK  Result Date: 10/24/2021 Scheduled remote reviewed. Normal device function.  Next remote 91 days- JJB  IR IMAGING GUIDED PORT INSERTION  Result Date: 10/20/2021 INDICATION: 66 year old female with history of lung cancer requiring central venous access for chemotherapy administration. EXAM: IMPLANTED PORT A CATH PLACEMENT WITH ULTRASOUND AND FLUOROSCOPIC GUIDANCE COMPARISON:  None. MEDICATIONS: None. ANESTHESIA/SEDATION: Moderate (conscious) sedation was employed during this procedure. A total of Versed 1 mg and Fentanyl 50 mcg was administered intravenously. Moderate Sedation Time: 14 minutes. The patient's level of consciousness and vital signs were monitored continuously by radiology nursing throughout the procedure under my direct supervision. CONTRAST:  None FLUOROSCOPY TIME:  0 minutes, 14 seconds (0 mGy) COMPLICATIONS: None immediate. PROCEDURE: The procedure, risks, benefits, and alternatives were explained to the patient. Questions regarding the procedure were encouraged and answered. The patient  understands and consents to the procedure. The right neck and chest were prepped with chlorhexidine in a sterile fashion, and a sterile drape was applied covering the operative field. Maximum barrier sterile technique with sterile gowns and gloves were used for the procedure. A timeout was performed prior to the initiation of the procedure. Ultrasound was used to examine the jugular vein which was compressible and free of internal echoes. A skin marker was used to demarcate the planned venotomy and port pocket incision sites. Local anesthesia was provided to these sites and the subcutaneous tunnel track with 1% lidocaine with 1:100,000 epinephrine. A small incision was created at the jugular access site and blunt dissection  was performed of the subcutaneous tissues. Under ultrasound guidance, the jugular vein was accessed with a 21 ga micropuncture needle and an 0.018" wire was inserted to the superior vena cava. Real-time ultrasound guidance was utilized for vascular access including the acquisition of a permanent ultrasound image documenting patency of the accessed vessel. A 5 Fr micopuncture set was then used, through which a 0.035" Rosen wire was passed under fluoroscopic guidance into the inferior vena cava. An 8 Fr dilator was then placed over the wire. A subcutaneous port pocket was then created along the upper chest wall utilizing a combination of sharp and blunt dissection. The pocket was irrigated with sterile saline, packed with gauze, and observed for hemorrhage. A single lumen "ISP" sized power injectable port was chosen for placement. The 8 Fr catheter was tunneled from the port pocket site to the venotomy incision. The port was placed in the pocket. The external catheter was trimmed to appropriate length. The dilator was exchanged for an 8 Fr peel-away sheath under fluoroscopic guidance. The catheter was then placed through the sheath and the sheath was removed. Final catheter positioning was  confirmed and documented with a fluoroscopic spot radiograph. The port was accessed with a Huber needle, aspirated, and flushed with heparinized saline. The deep dermal layer of the port pocket incision was closed with interrupted 3-0 Vicryl suture. The skin was opposed with a running subcuticular 4-0 Monocryl suture. Dermabond was then placed over the port pocket and neck incisions. The patient tolerated the procedure well without immediate post procedural complication. FINDINGS: After catheter placement, the tip lies within the superior cavoatrial junction. The catheter aspirates and flushes normally and is ready for immediate use. IMPRESSION: Successful placement of a power injectable Port-A-Cath via the right internal jugular vein. The catheter is ready for immediate use. Ruthann Cancer, MD [facility] Radiology Electronically Signed   By: Ruthann Cancer M.D.   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  She would be considered limited stage small cell lung cancer with radiation portals encompassing her known disease.  She would be a candidate for definitive course of treatment along with radiosensitizing chemotherapy.  She has significant comorbidities and fatigue will be a major issue for her.  Fortunately her disease is primarily along the right chest and we can avoid her pacemaker/defibrillator.  Her radiation fields should not involve a significant portion of her heart and hopefully will not compromise her heart failure issues.  We discussed consideration for prophylactic cranial radiation if she achieves a complete response with her chest radiation therapy and chemotherapy.  Today, I talked to the patient about the findings and work-up thus far.  We discussed the natural history of small cell lung cancer and general treatment, highlighting the role of  radiotherapy in the management.  We discussed the available radiation techniques, and focused on the details of logistics and delivery.  We reviewed the anticipated acute and late sequelae associated with radiation in this setting.  The patient was encouraged to ask questions that I answered to the best of my ability.  A patient consent form was discussed and signed.  We retained a copy for our records.  The patient would like to proceed with radiation and will be scheduled for CT simulation.  PLAN: She will return Wednesday, February 8 for CT simulation with treatments to begin approximately a week later.  Anticipate 6 weeks of radiation therapy directed at the mediastinal and right hilar region.   60 minutes of total time was spent for this patient encounter, including preparation, face-to-face counseling with the patient and coordination of care, physical exam, and documentation of the encounter.   ------------------------------------------------  Blair Promise, PhD, MD  This document serves as a record of services personally performed by Gery Pray, MD. It was created on his behalf by Roney Mans, a trained medical scribe. The creation of this record is based on the scribe's personal observations and the provider's statements to them. This document has been checked and approved by the attending provider.

## 2021-11-01 ENCOUNTER — Ambulatory Visit (HOSPITAL_COMMUNITY): Payer: Medicare Other

## 2021-11-01 ENCOUNTER — Encounter: Payer: Self-pay | Admitting: Radiation Oncology

## 2021-11-01 ENCOUNTER — Other Ambulatory Visit: Payer: Self-pay

## 2021-11-01 ENCOUNTER — Ambulatory Visit
Admission: RE | Admit: 2021-11-01 | Discharge: 2021-11-01 | Disposition: A | Discharge status: Home / Self Care | Payer: Medicare Other | Source: Ambulatory Visit | Attending: Radiation Oncology | Admitting: Radiation Oncology

## 2021-11-01 VITALS — BP 106/62 | HR 82 | Temp 97.0°F | Resp 18 | Ht <= 58 in | Wt 132.0 lb

## 2021-11-01 DIAGNOSIS — Z7982 Long term (current) use of aspirin: Secondary | ICD-10-CM | POA: Insufficient documentation

## 2021-11-01 DIAGNOSIS — C3411 Malignant neoplasm of upper lobe, right bronchus or lung: Secondary | ICD-10-CM | POA: Insufficient documentation

## 2021-11-01 DIAGNOSIS — I2510 Atherosclerotic heart disease of native coronary artery without angina pectoris: Secondary | ICD-10-CM | POA: Insufficient documentation

## 2021-11-01 DIAGNOSIS — Z86010 Personal history of colonic polyps: Secondary | ICD-10-CM | POA: Insufficient documentation

## 2021-11-01 DIAGNOSIS — G4730 Sleep apnea, unspecified: Secondary | ICD-10-CM | POA: Diagnosis not present

## 2021-11-01 DIAGNOSIS — I110 Hypertensive heart disease with heart failure: Secondary | ICD-10-CM | POA: Diagnosis not present

## 2021-11-01 DIAGNOSIS — Z79899 Other long term (current) drug therapy: Secondary | ICD-10-CM | POA: Insufficient documentation

## 2021-11-01 DIAGNOSIS — C3490 Malignant neoplasm of unspecified part of unspecified bronchus or lung: Secondary | ICD-10-CM

## 2021-11-01 DIAGNOSIS — K219 Gastro-esophageal reflux disease without esophagitis: Secondary | ICD-10-CM | POA: Diagnosis not present

## 2021-11-01 DIAGNOSIS — M797 Fibromyalgia: Secondary | ICD-10-CM | POA: Insufficient documentation

## 2021-11-01 DIAGNOSIS — C966 Unifocal Langerhans-cell histiocytosis: Secondary | ICD-10-CM | POA: Diagnosis not present

## 2021-11-01 DIAGNOSIS — J439 Emphysema, unspecified: Secondary | ICD-10-CM | POA: Insufficient documentation

## 2021-11-01 DIAGNOSIS — E785 Hyperlipidemia, unspecified: Secondary | ICD-10-CM | POA: Diagnosis not present

## 2021-11-01 DIAGNOSIS — E119 Type 2 diabetes mellitus without complications: Secondary | ICD-10-CM | POA: Insufficient documentation

## 2021-11-01 DIAGNOSIS — I5022 Chronic systolic (congestive) heart failure: Secondary | ICD-10-CM | POA: Diagnosis not present

## 2021-11-01 DIAGNOSIS — Z7984 Long term (current) use of oral hypoglycemic drugs: Secondary | ICD-10-CM | POA: Insufficient documentation

## 2021-11-01 DIAGNOSIS — Z801 Family history of malignant neoplasm of trachea, bronchus and lung: Secondary | ICD-10-CM | POA: Diagnosis not present

## 2021-11-01 DIAGNOSIS — I252 Old myocardial infarction: Secondary | ICD-10-CM | POA: Insufficient documentation

## 2021-11-01 DIAGNOSIS — F17210 Nicotine dependence, cigarettes, uncomplicated: Secondary | ICD-10-CM | POA: Diagnosis not present

## 2021-11-01 DIAGNOSIS — Z87442 Personal history of urinary calculi: Secondary | ICD-10-CM | POA: Insufficient documentation

## 2021-11-01 NOTE — Progress Notes (Signed)
See MD note for nursing evaluation. °

## 2021-11-02 ENCOUNTER — Telehealth: Payer: Self-pay | Admitting: Internal Medicine

## 2021-11-02 NOTE — Telephone Encounter (Signed)
Scheduled per 01/04 los, patient has been called and notified of upcoming appointments.

## 2021-11-03 MED FILL — Dexamethasone Sodium Phosphate Inj 100 MG/10ML: INTRAMUSCULAR | Qty: 1 | Status: AC

## 2021-11-03 MED FILL — Fosaprepitant Dimeglumine For IV Infusion 150 MG (Base Eq): INTRAVENOUS | Qty: 5 | Status: AC

## 2021-11-03 NOTE — Progress Notes (Signed)
Remote ICD transmission.   

## 2021-11-06 ENCOUNTER — Inpatient Hospital Stay: Payer: Medicare Other

## 2021-11-06 ENCOUNTER — Telehealth (HOSPITAL_COMMUNITY): Payer: Self-pay | Admitting: *Deleted

## 2021-11-06 ENCOUNTER — Inpatient Hospital Stay (HOSPITAL_BASED_OUTPATIENT_CLINIC_OR_DEPARTMENT_OTHER): Payer: Medicare Other | Admitting: Internal Medicine

## 2021-11-06 ENCOUNTER — Other Ambulatory Visit: Payer: Self-pay

## 2021-11-06 ENCOUNTER — Encounter: Payer: Self-pay | Admitting: *Deleted

## 2021-11-06 DIAGNOSIS — Z5111 Encounter for antineoplastic chemotherapy: Secondary | ICD-10-CM

## 2021-11-06 DIAGNOSIS — Z510 Encounter for antineoplastic radiation therapy: Secondary | ICD-10-CM | POA: Insufficient documentation

## 2021-11-06 DIAGNOSIS — T451X5A Adverse effect of antineoplastic and immunosuppressive drugs, initial encounter: Secondary | ICD-10-CM

## 2021-11-06 DIAGNOSIS — Z95828 Presence of other vascular implants and grafts: Secondary | ICD-10-CM

## 2021-11-06 DIAGNOSIS — D509 Iron deficiency anemia, unspecified: Secondary | ICD-10-CM | POA: Insufficient documentation

## 2021-11-06 DIAGNOSIS — C3411 Malignant neoplasm of upper lobe, right bronchus or lung: Secondary | ICD-10-CM | POA: Insufficient documentation

## 2021-11-06 DIAGNOSIS — D6481 Anemia due to antineoplastic chemotherapy: Secondary | ICD-10-CM

## 2021-11-06 DIAGNOSIS — C3491 Malignant neoplasm of unspecified part of right bronchus or lung: Secondary | ICD-10-CM | POA: Insufficient documentation

## 2021-11-06 DIAGNOSIS — C3490 Malignant neoplasm of unspecified part of unspecified bronchus or lung: Secondary | ICD-10-CM

## 2021-11-06 LAB — CBC WITH DIFFERENTIAL (CANCER CENTER ONLY)
Abs Immature Granulocytes: 0.04 10*3/uL (ref 0.00–0.07)
Basophils Absolute: 0.1 10*3/uL (ref 0.0–0.1)
Basophils Relative: 1 %
Eosinophils Absolute: 0.1 10*3/uL (ref 0.0–0.5)
Eosinophils Relative: 1 %
HCT: 34.6 % — ABNORMAL LOW (ref 36.0–46.0)
Hemoglobin: 10.7 g/dL — ABNORMAL LOW (ref 12.0–15.0)
Immature Granulocytes: 1 %
Lymphocytes Relative: 21 %
Lymphs Abs: 1.2 10*3/uL (ref 0.7–4.0)
MCH: 25.2 pg — ABNORMAL LOW (ref 26.0–34.0)
MCHC: 30.9 g/dL (ref 30.0–36.0)
MCV: 81.6 fL (ref 80.0–100.0)
Monocytes Absolute: 0.8 10*3/uL (ref 0.1–1.0)
Monocytes Relative: 14 %
Neutro Abs: 3.5 10*3/uL (ref 1.7–7.7)
Neutrophils Relative %: 62 %
Platelet Count: 323 10*3/uL (ref 150–400)
RBC: 4.24 MIL/uL (ref 3.87–5.11)
RDW: 22.9 % — ABNORMAL HIGH (ref 11.5–15.5)
WBC Count: 5.6 10*3/uL (ref 4.0–10.5)
nRBC: 0 % (ref 0.0–0.2)

## 2021-11-06 LAB — CMP (CANCER CENTER ONLY)
ALT: 16 U/L (ref 0–44)
AST: 15 U/L (ref 15–41)
Albumin: 3.9 g/dL (ref 3.5–5.0)
Alkaline Phosphatase: 79 U/L (ref 38–126)
Anion gap: 7 (ref 5–15)
BUN: 19 mg/dL (ref 8–23)
CO2: 28 mmol/L (ref 22–32)
Calcium: 9.3 mg/dL (ref 8.9–10.3)
Chloride: 98 mmol/L (ref 98–111)
Creatinine: 1.01 mg/dL — ABNORMAL HIGH (ref 0.44–1.00)
GFR, Estimated: >60 mL/min (ref >60)
Glucose, Bld: 197 mg/dL — ABNORMAL HIGH (ref 70–99)
Potassium: 4.1 mmol/L (ref 3.5–5.1)
Sodium: 133 mmol/L — ABNORMAL LOW (ref 135–145)
Total Bilirubin: 0.4 mg/dL (ref 0.3–1.2)
Total Protein: 6.7 g/dL (ref 6.5–8.1)

## 2021-11-06 LAB — SAMPLE TO BLOOD BANK

## 2021-11-06 MED ORDER — HEPARIN SOD (PORK) LOCK FLUSH 100 UNIT/ML IV SOLN
500.0000 [IU] | Freq: Once | INTRAVENOUS | Status: AC | PRN
  Administered 2021-11-06: 500 [IU]

## 2021-11-06 MED ORDER — SODIUM CHLORIDE 0.9 % IV SOLN
150.0000 mg | Freq: Once | INTRAVENOUS | Status: AC
  Administered 2021-11-06: 150 mg via INTRAVENOUS
  Filled 2021-11-06: qty 150

## 2021-11-06 MED ORDER — SODIUM CHLORIDE 0.9% FLUSH
10.0000 mL | INTRAVENOUS | Status: AC | PRN
  Administered 2021-11-06: 10 mL

## 2021-11-06 MED ORDER — SODIUM CHLORIDE 0.9% FLUSH
10.0000 mL | INTRAVENOUS | Status: DC | PRN
  Administered 2021-11-06: 10 mL

## 2021-11-06 MED ORDER — SODIUM CHLORIDE 0.9 % IV SOLN
10.0000 mg | Freq: Once | INTRAVENOUS | Status: AC
  Administered 2021-11-06: 10 mg via INTRAVENOUS
  Filled 2021-11-06: qty 10

## 2021-11-06 MED ORDER — SODIUM CHLORIDE 0.9 % IV SOLN: Freq: Once | INTRAVENOUS | Status: AC

## 2021-11-06 MED ORDER — SODIUM CHLORIDE 0.9 % IV SOLN
100.0000 mg/m2 | Freq: Once | INTRAVENOUS | Status: AC
  Administered 2021-11-06: 150 mg via INTRAVENOUS
  Filled 2021-11-06: qty 7.5

## 2021-11-06 MED ORDER — SODIUM CHLORIDE 0.9 % IV SOLN
380.0000 mg | Freq: Once | INTRAVENOUS | Status: AC
  Administered 2021-11-06: 380 mg via INTRAVENOUS
  Filled 2021-11-06: qty 38

## 2021-11-06 MED ORDER — PALONOSETRON HCL INJECTION 0.25 MG/5ML
0.2500 mg | Freq: Once | INTRAVENOUS | Status: AC
  Administered 2021-11-06: 0.25 mg via INTRAVENOUS
  Filled 2021-11-06: qty 5

## 2021-11-06 MED FILL — Dexamethasone Sodium Phosphate Inj 100 MG/10ML: INTRAMUSCULAR | Qty: 1 | Status: AC

## 2021-11-06 NOTE — Progress Notes (Signed)
[facility] [phone]   [phone]  OFFICE PROGRESS NOTE  Tower, [name], MD [place] [address]  DIAGNOSIS:  1) Limited stage (T3, N2, M0) small cell lung cancer presented with right hilar and mediastinal lymphadenopathy. There was no hypermetabolism in the left adrenal gland lesion. Diagnosed in December 2022. 2)  Iron Deficiency Anemia   PRIOR THERAPY:  1) Systemic chemotherapy with systemic chemotherapy with carboplatin for an AUC of 5, etoposide 100 mg per metered squared, and Imfinzi 1500 mg IV every 3 weeks with Cosela for myeloprotection.  First dose on 10/16/2021.  Discontinued after 1 cycle as the patient staging PET scan showed limited stage disease.   CURRENT THERAPY: 1) systemic chemotherapy with carboplatin for an AUC of 5 on day 1 and etoposide 100 mg/m2 on days 1, 2, and 3 IV every 3 weeks.  First dose expected on 11/06/2021.  This will be concurrent with radiotherapy under the care of Dr. Sondra Come. 2) Venofer infusions as needed. Last dose on 02/07/21   INTERVAL HISTORY: Diane [name] 66 y.o. female returns to the clinic today for follow-up visit.  The patient is feeling fine today with no concerning complaints.  She denied having any current chest pain, shortness of breath, cough or hemoptysis.  She has no nausea, vomiting, diarrhea or constipation.  She has no headache or visual changes.  She denied having any significant weight loss or night sweats.  The patient is here today for evaluation before starting the first cycle of her treatment with carboplatin and etoposide.  MEDICAL HISTORY: Past Medical History:  Diagnosis Date   Anemia    Arthritis    Carotid stenosis    a. s/p right carotid stent 10/2017.   Chronic systolic CHF (congestive heart failure) (HCC)    Colon polyps    COPD (chronic obstructive pulmonary disease) (HCC)    Coronary artery disease    post CABG in 3/07 , coronary stents     Depression    after major stroke years ago, no longer treated   Diabetes mellitus    type 2   Dyslipidemia    Fatty liver    Fibromyalgia    GERD (gastroesophageal reflux disease)    Headache    hx of    History of kidney stones    Hyperlipidemia    Hypertension    Myocardial infarction ([place])    Pneumonia    hx of    Seizures ([place])    last seizure- 03/2013    Shortness of breath dyspnea    with exertion or when fluid builds up    Sleep apnea    used to wear a cpap- not used in 3 years    Status post dilation of esophageal narrowing    Stroke [facility]) 1993   problems with balance    Systolic murmur    known mild AS and MR   Thyroid goiter    Tobacco abuse     ALLERGIES:  is allergic to bee venom, benadryl [diphenhydramine], entresto [sacubitril-valsartan], farxiga [dapagliflozin], tetracycline, canagliflozin, sitagliptin, and ace inhibitors.  MEDICATIONS:  Current Outpatient Medications  Medication Sig Dispense Refill   acetaminophen (TYLENOL) 500 MG tablet Take 1,000 mg by mouth at bedtime.     albuterol (PROVENTIL) (2.5 MG/3ML) 0.083% nebulizer solution Take 3 mLs (2.5 mg total) by nebulization every 6 (six) hours as needed for wheezing or shortness of breath. 1180 mL 3  albuterol (VENTOLIN HFA) 108 (90 Base) MCG/ACT inhaler Inhale 2 puffs into the lungs every 6 (six) hours as needed for wheezing or shortness of breath. 24 g 3   aspirin EC 81 MG tablet Take 81 mg by mouth every evening.  30 tablet 6   BD PEN NEEDLE NANO 2ND GEN 32G X 4 MM MISC Inject into the skin 3 (three) times daily.     clopidogrel (PLAVIX) 75 MG tablet Take 1 tablet (75 mg total) by mouth daily. 90 tablet 3   dexlansoprazole (DEXILANT) 60 MG capsule Take 1 capsule (60 mg total) by mouth every evening.     digoxin (LANOXIN) 0.125 MG tablet Take 0.5 tablets (0.0625 mg total) by mouth daily. 45 tablet 3   diphenhydrAMINE (BENADRYL) 25 mg capsule Take 25 mg by mouth 2 (two) times daily as needed for itching  or allergies.     ezetimibe (ZETIA) 10 MG tablet Take 1 tablet (10 mg total) by mouth daily. 90 tablet 3   fluticasone (FLONASE) 50 MCG/ACT nasal spray Place 2 sprays into both nostrils daily. (Patient taking differently: Place 2 sprays into both nostrils daily as needed for allergies.) 48 g 3   gabapentin (NEURONTIN) 300 MG capsule Take 2 capsules (600 mg total) by mouth at bedtime. TAKE 2 CAPSULES BY MOUTH EVERY DAY AT BEDTIME     glucose blood (ONETOUCH ULTRA) test strip 3 (three) times daily.     guaiFENesin (MUCINEX) 600 MG 12 hr tablet Take 2 tablets (1,200 mg total) by mouth 2 (two) times daily. 360 tablet 3   HYDROcodone-acetaminophen (NORCO) 5-325 MG tablet Take 1 tablet by mouth daily as needed for severe pain. 15 tablet 0   icosapent Ethyl (VASCEPA) 1 g capsule Take 2 capsules (2 g total) by mouth 2 (two) times daily. 360 capsule 3   insulin aspart protamine- aspart (NOVOLOG MIX 70/30) (70-30) 100 UNIT/ML injection Inject 5-65 Units into the skin See admin instructions. 65 units in the Am with breakfast and 5 units with lunch and 55 units with dinner     levETIRAcetam (KEPPRA) 1000 MG tablet Take 1 tablet (1,000 mg total) by mouth 2 (two) times daily.     lidocaine-prilocaine (EMLA) cream Apply to the Port-A-Cath site 30-60 minutes before chemotherapy (Patient not taking: Reported on 11/01/2021) 30 g 0   metFORMIN (GLUCOPHAGE-XR) 500 MG 24 hr tablet Take 500 mg by mouth every evening.   6   metolazone (ZAROXOLYN) 2.5 MG tablet Take 1 tablet (2.5 mg total) by mouth 2 (two) times a week. Every Mon and Fri 30 tablet 3   nicotine (NICODERM CQ - DOSED IN MG/24 HOURS) 21 mg/24hr patch Place 21 mg onto the skin daily as needed (to cease smoking).     nicotine polacrilex (NICORETTE) 4 MG gum Take 4 mg by mouth daily as needed for smoking cessation.     nitroGLYCERIN (NITROLINGUAL) 0.4 MG/SPRAY spray Place 1 spray under the tongue every 5 (five) minutes x 3 doses as needed for chest pain. 12 g 3    nystatin (MYCOSTATIN/NYSTOP) powder APPLY 1 APPLICATION        TOPICALLY 4 TIMES A DAY AS NEEDED FOR YEAST INFECTIONS 60 g 5   ONE TOUCH ULTRA TEST test strip USE AS DIRECTED FOR TESTING BLOOD GLUCOSE 3 TIMES DAILY  1   polyethylene glycol (MIRALAX / GLYCOLAX) 17 g packet Take 17 g by mouth daily.     potassium chloride SA (KLOR-CON M20) 20 MEQ tablet Take 2 tablets (  40 mEq total) by mouth every morning AND 1 tablet (20 mEq total) every evening. 270 tablet 3   prochlorperazine (COMPAZINE) 10 MG tablet Take 1 tablet (10 mg total) by mouth every 6 (six) hours as needed for nausea or vomiting. 30 tablet 0   promethazine (PHENERGAN) 12.5 MG tablet Take 1 tablet (12.5 mg total) by mouth every 6 (six) hours as needed for nausea or vomiting. 30 tablet 2   ranolazine (RANEXA) 1000 MG SR tablet Take 1 tablet (1,000 mg total) by mouth 2 (two) times daily. 180 tablet 3   rosuvastatin (CRESTOR) 20 MG tablet Take 1 tablet (20 mg total) by mouth at bedtime.     sodium chloride (OCEAN) 0.65 % SOLN nasal spray Place 1 spray into both nostrils as needed for congestion.     spironolactone (ALDACTONE) 25 MG tablet Take 1 tablet (25 mg total) by mouth at bedtime. TAKE 1 TABLET BY MOUTH EVERYDAY AT BEDTIME     torsemide (DEMADEX) 20 MG tablet Take 3 tablets (60 mg total) by mouth 2 (two) times daily. 540 tablet 3   triamcinolone cream (KENALOG) 0.5 % Apply 1 application topically 3 (three) times daily as needed (rash). To affected areas     Vericiguat 10 MG TABS Take 10 mg by mouth daily.     No current facility-administered medications for this visit.    SURGICAL HISTORY:  Past Surgical History:  Procedure Laterality Date   ABDOMINAL AORTOGRAM W/LOWER EXTREMITY N/A 04/18/2020   Procedure: ABDOMINAL AORTOGRAM W/LOWER EXTREMITY;  Surgeon: Lorretta Harp, MD;  Location: [facility];  Service: Cardiovascular;  Laterality: N/A;   APPENDECTOMY     BIOPSY  02/03/2019   Procedure: BIOPSY;  Surgeon: Ladene Artist, MD;  Location: [facility];  Service: Endoscopy;;   BREAST BIOPSY Left 2016   BRONCHIAL BIOPSY  09/18/2021   Procedure: BRONCHIAL BIOPSIES;  Surgeon: Collene Gobble, MD;  Location: [facility] ENDOSCOPY;  Service: Pulmonary;;   BRONCHIAL BRUSHINGS  09/18/2021   Procedure: BRONCHIAL BRUSHINGS;  Surgeon: Collene Gobble, MD;  Location: [facility] ENDOSCOPY;  Service: Pulmonary;;   BRONCHIAL NEEDLE ASPIRATION BIOPSY  09/18/2021   Procedure: BRONCHIAL NEEDLE ASPIRATION BIOPSIES;  Surgeon: Collene Gobble, MD;  Location: [facility] ENDOSCOPY;  Service: Pulmonary;;   CARDIAC CATHETERIZATION  11/29/05   EF of 55%   CARDIAC CATHETERIZATION  08/06/06   EF of 45-50%   CARDIAC CATHETERIZATION N/A 05/06/2015   Procedure: Right/Left Heart Cath and Coronary/Graft Angiography;  Surgeon: Peter M Martinique, MD;  Location: [facility];  Service: Cardiovascular;  Laterality: N/A;   CAROTID PTA/STENT INTERVENTION N/A 10/10/2017   Procedure: CAROTID PTA/STENT INTERVENTION - Right;  Surgeon: Serafina Mitchell, MD;  Location: [facility];  Service: Cardiovascular;  Laterality: N/A;   CERVICAL FUSION  1990   CHOLECYSTECTOMY     COLON RESECTION     mass removed and 4 in of colon   COLONOSCOPY WITH PROPOFOL N/A 07/16/2017   Procedure: COLONOSCOPY WITH PROPOFOL;  Surgeon: Ladene Artist, MD;  Location: [facility];  Service: Endoscopy;  Laterality: N/A;   CORONARY ARTERY BYPASS GRAFT  12/04/2005   x5 -- left internal mammary artery to the LAD, left radial artery to the ramus intermedius, saphenous vein graft to the obtuse marginal 1, sequential saphenous vein grat to the acute marginal and posterior descending, endoscopic vein harvesting from the left thigh with open vein harvest from right leg   CORONARY STENT PLACEMENT  08/11/06  PCI of her ciurcumflex/OM vessel   ESOPHAGOGASTRODUODENOSCOPY (EGD) WITH PROPOFOL N/A 02/03/2019   Procedure: ESOPHAGOGASTRODUODENOSCOPY (EGD) WITH PROPOFOL;  Surgeon: Ladene Artist, MD;  Location: [facility]  ENDOS[facility];  Service: Endoscopy;  Laterality: N/A;   FINE NEEDLE ASPIRATION  09/18/2021   Procedure: FINE NEEDLE ASPIRATION (FNA) LINEAR;  Surgeon: Collene Gobble, MD;  Location: [facility] ENDOSCOPY;  Service: Pulmonary;;   ICD IMPLANT N/A 07/15/2020   Procedure: ICD IMPLANT;  Surgeon: Vickie Epley, MD;  Location: [facility];  Service: Cardiovascular;  Laterality: N/A;   IR IMAGING GUIDED PORT INSERTION  10/20/2021   LAPAROTOMY Bilateral 05/19/2015   Procedure: EXPLORATORY LAPAROTOMY WITH BILATERAL SALPINGO OOPHORECTOMY /OMENTECTOMY/SEGMENTAL SIGMOID COLECTOMY ;  Surgeon: Everitt Amber, MD;  Location: [facility];  Service: Gynecology;  Laterality: Bilateral;   PERIPHERAL VASCULAR BALLOON ANGIOPLASTY  04/18/2020   Procedure: PERIPHERAL VASCULAR BALLOON ANGIOPLASTY;  Surgeon: Lorretta Harp, MD;  Location: [facility];  Service: Cardiovascular;;  rt SFA  atherectomy and DCB   PERIPHERAL VASCULAR CATHETERIZATION N/A 11/15/2015   Procedure: Carotid PTA/Stent Intervention;  Surgeon: Lorretta Harp, MD;  Location: [facility];  Service: Cardiovascular;  Laterality: N/A;   PERIPHERAL VASCULAR CATHETERIZATION  11/15/2015   Procedure: Carotid Angiography;  Surgeon: Lorretta Harp, MD;  Location: [facility];  Service: Cardiovascular;;   RIGHT HEART CATH N/A 01/27/2020   Procedure: RIGHT HEART CATH;  Surgeon: Larey Dresser, MD;  Location: [facility];  Service: Cardiovascular;  Laterality: N/A;   VIDEO BRONCHOSCOPY WITH ENDOBRONCHIAL ULTRASOUND N/A 09/18/2021   Procedure: VIDEO BRONCHOSCOPY WITH ENDOBRONCHIAL ULTRASOUND;  Surgeon: Collene Gobble, MD;  Location: [facility] ENDOSCOPY;  Service: Pulmonary;  Laterality: N/A;   VIDEO BRONCHOSCOPY WITH RADIAL ENDOBRONCHIAL ULTRASOUND  09/18/2021   Procedure: RADIAL ENDOBRONCHIAL ULTRASOUND;  Surgeon: Collene Gobble, MD;  Location: [facility] ENDOSCOPY;  Service: Pulmonary;;    REVIEW OF SYSTEMS:  A comprehensive review of systems was  negative except for: Constitutional: positive for fatigue   PHYSICAL EXAMINATION: General appearance: alert, cooperative, appears stated age, and no distress Head: Normocephalic, without obvious abnormality, atraumatic Neck: no adenopathy, no JVD, supple, symmetrical, trachea midline, and thyroid not enlarged, symmetric, no tenderness/mass/nodules Lymph nodes: Cervical, supraclavicular, and axillary nodes normal. Resp: clear to auscultation bilaterally Back: symmetric, no curvature. ROM normal. No CVA tenderness. Cardio: regular rate and rhythm, S1, S2 normal, no murmur, click, rub or gallop GI: soft, non-tender; bowel sounds normal; no masses,  no organomegaly Extremities: extremities normal, atraumatic, no cyanosis or edema  ECOG PERFORMANCE STATUS: 1 - Symptomatic but completely ambulatory  Blood pressure 105/66, pulse 78, temperature (!) 96.9 F (36.1 C), temperature source Tympanic, resp. rate 18, height 4\' 9"  (1.448 m), weight 135 lb (61.2 kg), last menstrual period 09/02/2003, SpO2 100 %.  LABORATORY DATA: Lab Results  Component Value Date   WBC 5.6 11/06/2021   HGB 10.7 (L) 11/06/2021   HCT 34.6 (L) 11/06/2021   MCV 81.6 11/06/2021   PLT 323 11/06/2021      Chemistry      Component Value Date/Time   NA 130 (L) 10/23/2021 1440   NA 135 04/14/2020 1544   NA 135 (L) 02/15/2017 1457   K 4.1 10/23/2021 1440   K 4.8 02/15/2017 1457   CL 93 (L) 10/23/2021 1440   CO2 28 10/23/2021 1440   CO2 26 02/15/2017 1457   BUN 24 (H) 10/23/2021 1440   BUN 17 04/14/2020 1544   BUN 10.8 02/15/2017  1457   CREATININE 0.96 10/23/2021 1440   CREATININE 0.69 05/06/2017 1405   CREATININE 0.7 02/15/2017 1457      Component Value Date/Time   CALCIUM 9.3 10/23/2021 1440   CALCIUM 9.3 02/15/2017 1457   ALKPHOS 83 10/23/2021 1440   ALKPHOS 75 02/15/2017 1457   AST 15 10/23/2021 1440   AST 9 02/15/2017 1457   ALT 12 10/23/2021 1440   ALT 13 02/15/2017 1457   BILITOT 0.4 10/23/2021 1440    BILITOT 0.28 02/15/2017 1457       RADIOGRAPHIC STUDIES: CT HEAD W & WO CONTRAST (5MM)  Result Date: 10/20/2021 CLINICAL DATA:  Small cell lung cancer staging.  Seizures EXAM: CT HEAD WITHOUT AND WITH CONTRAST TECHNIQUE: Contiguous axial images were obtained from the base of the skull through the vertex without and with intravenous contrast. RADIATION DOSE REDUCTION: This exam was performed according to the departmental dose-optimization program which includes automated exposure control, adjustment of the mA and/or kV according to patient size and/or use of iterative reconstruction technique. CONTRAST:  90mL OMNIPAQUE IOHEXOL 300 MG/ML  SOLN COMPARISON:  Head CT 10/10/2017 FINDINGS: Brain: No swelling or enhancement to suggest metastatic disease. Moderate to large remote right parietal and posterior temporal infarct with diffuse cortical involvement in this patient with history of seizure. Age normal brain volume and white matter appearance. No hydrocephalus or collection Vascular: Major vessels are enhancing Skull: Negative Sinuses/Orbits: Negative IMPRESSION: 1. No evidence of metastatic disease to the brain. 2. Moderate to large remote right MCA branch infarct which involves cortex in this patient with history of seizure. Electronically Signed   By: Jorje Guild M.D.   On: 10/20/2021 08:25   NM PET Image Initial (PI) Skull Base To Thigh (F-18 FDG)  Result Date: 10/20/2021 CLINICAL DATA:  Initial treatment strategy for small-cell lung cancer. EXAM: NUCLEAR MEDICINE PET SKULL BASE TO THIGH TECHNIQUE: 7.2 mCi F-18 FDG was injected intravenously. Full-ring PET imaging was performed from the skull base to thigh after the radiotracer. CT data was obtained and used for attenuation correction and anatomic localization. Fasting blood glucose: 99 mg/dl COMPARISON:  Chest CT 09/08/2021 FINDINGS: Mediastinal blood pool activity: SUV max 2.3 Liver activity: SUV max NA NECK: No hypermetabolic lymph nodes in  the neck. Incidental CT findings: none CHEST: Bulky right paratracheal lymphadenopathy is markedly hypermetabolic with SUV max = 8.9. Patient's right hilar lymphadenopathy is also hypermetabolic with SUV max = 9.0 The right upper lobe pulmonary nodule seen on previous CT of 09/08/2021 have almost completely resolved in the interval. No hypermetabolism in this region on PET imaging today. Incidental CT findings: Coronary artery calcification is evident. Moderate atherosclerotic calcification is noted in the wall of the thoracic aorta. Status post CABG. 5 mm right upper lobe nodule on 57/4 is stable in the interval. Adjacent additional right upper lobe nodule seen on the previous CT have resolved in the interval. No new suspicious nodule or mass by CT imaging. ABDOMEN/PELVIS: No abnormal hypermetabolic activity within the liver, pancreas, adrenal glands, or spleen. No hypermetabolic lymph nodes in the abdomen or pelvis. Incidental CT findings: Tiny nonobstructing stone noted interpolar right kidney with small nonobstructing stone or vascular calcification noted upper pole left kidney. There is moderate atherosclerotic calcification of the abdominal aorta without aneurysm. SKELETON: Uptake identified in the anterior fourth rib is associated with what appears to be in acute fracture (axial image 70/series 4). There is also focal uptake associated with the anterior left fifth rib (image 80/4) also associated with  acute nondisplaced fracture. No findings to raise concern for hypermetabolic bony metastases. Incidental CT findings: none IMPRESSION: 1. Hypermetabolic right hilar and right mediastinal lymphadenopathy, consistent with the patient's known neoplasm. No evidence for hypermetabolic metastatic disease in the neck, abdomen, or pelvis. 2. Cluster of small right upper lobe pulmonary nodule seen on the previous CT scan have nearly resolved in the interval. 3. Acute nondisplaced fractures of the anterior right fourth  and left fifth ribs, showing low level hypermetabolism. No findings to suggest definite hypermetabolic bony metastatic disease on today's study. 4.  Aortic Atherosclerois (ICD10-170.0) Electronically Signed   By: Misty Stanley M.D.   On: 10/20/2021 11:06   CUP PACEART REMOTE DEVICE CHECK  Result Date: 10/24/2021 Scheduled remote reviewed. Normal device function.  Next remote 91 days- JJB  IR IMAGING GUIDED PORT INSERTION  Result Date: 10/20/2021 INDICATION: 66 year old female with history of lung cancer requiring central venous access for chemotherapy administration. EXAM: IMPLANTED PORT A CATH PLACEMENT WITH ULTRASOUND AND FLUOROSCOPIC GUIDANCE COMPARISON:  None. MEDICATIONS: None. ANESTHESIA/SEDATION: Moderate (conscious) sedation was employed during this procedure. A total of Versed 1 mg and Fentanyl 50 mcg was administered intravenously. Moderate Sedation Time: 14 minutes. The patient's level of consciousness and vital signs were monitored continuously by radiology nursing throughout the procedure under my direct supervision. CONTRAST:  None FLUOROSCOPY TIME:  0 minutes, 14 seconds (0 mGy) COMPLICATIONS: None immediate. PROCEDURE: The procedure, risks, benefits, and alternatives were explained to the patient. Questions regarding the procedure were encouraged and answered. The patient understands and consents to the procedure. The right neck and chest were prepped with chlorhexidine in a sterile fashion, and a sterile drape was applied covering the operative field. Maximum barrier sterile technique with sterile gowns and gloves were used for the procedure. A timeout was performed prior to the initiation of the procedure. Ultrasound was used to examine the jugular vein which was compressible and free of internal echoes. A skin marker was used to demarcate the planned venotomy and port pocket incision sites. Local anesthesia was provided to these sites and the subcutaneous tunnel track with 1% lidocaine  with 1:100,000 epinephrine. A small incision was created at the jugular access site and blunt dissection was performed of the subcutaneous tissues. Under ultrasound guidance, the jugular vein was accessed with a 21 ga micropuncture needle and an 0.018" wire was inserted to the superior vena cava. Real-time ultrasound guidance was utilized for vascular access including the acquisition of a permanent ultrasound image documenting patency of the accessed vessel. A 5 Fr micopuncture set was then used, through which a 0.035" Rosen wire was passed under fluoroscopic guidance into the inferior vena cava. An 8 Fr dilator was then placed over the wire. A subcutaneous port pocket was then created along the upper chest wall utilizing a combination of sharp and blunt dissection. The pocket was irrigated with sterile saline, packed with gauze, and observed for hemorrhage. A single lumen "ISP" sized power injectable port was chosen for placement. The 8 Fr catheter was tunneled from the port pocket site to the venotomy incision. The port was placed in the pocket. The external catheter was trimmed to appropriate length. The dilator was exchanged for an 8 Fr peel-away sheath under fluoroscopic guidance. The catheter was then placed through the sheath and the sheath was removed. Final catheter positioning was confirmed and documented with a fluoroscopic spot radiograph. The port was accessed with a Huber needle, aspirated, and flushed with heparinized saline. The deep dermal layer of the  port pocket incision was closed with interrupted 3-0 Vicryl suture. The skin was opposed with a running subcuticular 4-0 Monocryl suture. Dermabond was then placed over the port pocket and neck incisions. The patient tolerated the procedure well without immediate post procedural complication. FINDINGS: After catheter placement, the tip lies within the superior cavoatrial junction. The catheter aspirates and flushes normally and is ready for immediate  use. IMPRESSION: Successful placement of a power injectable Port-A-Cath via the right internal jugular vein. The catheter is ready for immediate use. Ruthann Cancer, MD [facility] Electronically Signed   By: Ruthann Cancer M.D.   On: 10/20/2021 15:52    ASSESSMENT AND PLAN: This is a very pleasant 66 years old white female recently diagnosed with Limited stage (T3, N2, M0) small cell lung cancer presented with right hilar and mediastinal lymphadenopathy. There was no hypermetabolism in the left adrenal gland lesion. Diagnosed in December 2022. She initially started treatment with carboplatin, etoposide, Cosela and Imfinzi with the expectation that she had extensive stage disease status post 1 cycle. Staging work-up with PET scan showed no hypermetabolic activity and the suspicious left adrenal gland lesion. The patient will start the first cycle of treatment with carboplatin for AUC of 5 on day 1, etoposide 100 Mg/M2 on days 1, 2 and 3 every 3 weeks concurrent with radiation.  First dose today 11/06/2021. She is feeling fine. I recommended for the patient to proceed with the first cycle of her treatment today as planned. For the history of iron deficiency anemia, she received iron infusion with Venofer and her hemoglobin and hematocrit are better. The patient will come back for follow-up visit in 3 weeks for evaluation before starting cycle #2 of this regimen. She was advised to call immediately if she has any other concerning symptoms in the interval. The patient voices understanding of current disease status and treatment options and is in agreement with the current care plan.  All questions were answered. The patient knows to call the clinic with any problems, questions or concerns. We can certainly see the patient much sooner if necessary. The total time spent in the appointment was 20 minutes.  Disclaimer: This note was dictated with voice  recognition software. Similar sounding words can inadvertently be transcribed and may not be corrected upon review.

## 2021-11-06 NOTE — Progress Notes (Signed)
Oncology Nurse Navigator Documentation  Oncology Nurse Navigator Flowsheets 11/06/2021 10/13/2021 10/04/2021 09/26/2021  Abnormal Finding Date 09/08/2021 - - 09/11/2021  Confirmed Diagnosis Date 09/18/2021 - - 09/18/2021  Diagnosis Status - - - Confirmed Diagnosis Complete  Planned Course of Treatment - - Chemotherapy -  Phase of Treatment - Chemo Chemo -  Chemotherapy Actual Start Date: - 10/16/2021 - -  Navigator Follow Up Date: 11/27/2021 10/23/2021 - -  Navigator Follow Up Reason: Follow-up Appointment Follow-up Appointment Appointment Review New Patient Appointment  Navigation Complete Date: - - 10/06/2021 10/04/2021  Navigator Location [facility]  Referral Date to RadOnc/MedOnc - - - 09/26/2021  Navigator Encounter Type Clinic/MDC/I spoke to Ms. Straub today.  She is doing well with her tx.  I listened as she explained.  I offered her support and encouragement.  Other: Clinic/MDC;Initial MedOnc Telephone  Telephone - - - Outgoing Call  Treatment Initiated Date 10/16/2021 - - -  Patient Visit Type MedOnc;Follow-up Other Initial;MedOnc -  Treatment Phase Treatment - Pre-Tx/Tx Discussion Pre-Tx/Tx Discussion  Barriers/Navigation Needs Education Coordination of Care Education Coordination of Care;Education  Education Other - Smoking cessation;Newly Diagnosed Cancer Education;Understanding Cancer/ Treatment Options;Other Other  Interventions Education;Psycho-Social Support Coordination of Care Education;Psycho-Social Support Coordination of Care;Psycho-Social Support;Education  Acuity Level 2-Minimal Needs (1-2 Barriers Identified) Level 2-Minimal Needs (1-2 Barriers Identified) Level 3-Moderate Needs (3-4 Barriers Identified) Level 2-Minimal Needs (1-2 Barriers Identified)  Coordination of Care - Other - Appts  Education Method Verbal - Written;Verbal Verbal  Time Spent with Patient [phone]

## 2021-11-06 NOTE — Telephone Encounter (Signed)
She should go ahead with the radiation.  I am sorry to hear about the cancer.

## 2021-11-06 NOTE — Telephone Encounter (Signed)
Pt left vm stating she has been diagnosed with lung cancer and is about to start radiation. Pt asked if Dr.Mclean had any concerns about her heart and starting radiation. Pt has f/u in March.    Routed to [place]

## 2021-11-06 NOTE — Patient Instructions (Signed)
[facility]  Discharge Instructions: Thank you for choosing [facility] to provide your oncology and hematology care.   If you have a lab appointment with the [facility], please go directly to the [facility] and check in at the registration area.   Wear comfortable clothing and clothing appropriate for easy access to any Portacath or PICC line.   We strive to give you quality time with your provider. You may need to reschedule your appointment if you arrive late (15 or more minutes).  Arriving late affects you and other patients whose appointments are after yours.  Also, if you miss three or more appointments without notifying the office, you may be dismissed from the clinic at the providers discretion.      For prescription refill requests, have your pharmacy contact our office and allow 72 hours for refills to be completed.    Today you received the following chemotherapy and/or immunotherapy agents: Carboplatin and etoposide    To help prevent nausea and vomiting after your treatment, we encourage you to take your nausea medication as directed.  BELOW ARE SYMPTOMS THAT SHOULD BE REPORTED IMMEDIATELY: *FEVER GREATER THAN 100.4 F (38 C) OR HIGHER *CHILLS OR SWEATING *NAUSEA AND VOMITING THAT IS NOT CONTROLLED WITH YOUR NAUSEA MEDICATION *UNUSUAL SHORTNESS OF BREATH *UNUSUAL BRUISING OR BLEEDING *URINARY PROBLEMS (pain or burning when urinating, or frequent urination) *BOWEL PROBLEMS (unusual diarrhea, constipation, pain near the anus) TENDERNESS IN MOUTH AND THROAT WITH OR WITHOUT PRESENCE OF ULCERS (sore throat, sores in mouth, or a toothache) UNUSUAL RASH, SWELLING OR PAIN  UNUSUAL VAGINAL DISCHARGE OR ITCHING   Items with * indicate a potential emergency and should be followed up as soon as possible or go to the Emergency Department if any problems should occur.  Please show the CHEMOTHERAPY ALERT CARD or IMMUNOTHERAPY ALERT CARD  at check-in to the Emergency Department and triage nurse.  Should you have questions after your visit or need to cancel or reschedule your appointment, please contact [facility]  Dept: [phone]  and follow the prompts.  Office hours are 8:00 a.m. to 4:30 p.m. Monday - Friday. Please note that voicemails left after 4:00 p.m. may not be returned until the following business day.  We are closed weekends and major holidays. You have access to a nurse at all times for urgent questions. Please call the main number to the clinic Dept: [phone] and follow the prompts.   For any non-urgent questions, you may also contact your provider using MyChart. We now offer e-Visits for anyone 75 and older to request care online for non-urgent symptoms. For details visit [email or web address].   Also download the MyChart app! Go to the app store, search "MyChart", open the app, select [facility], and log in with your MyChart username and password.  Due to Covid, a mask is required upon entering the hospital/clinic. If you do not have a mask, one will be given to you upon arrival. For doctor visits, patients may have 1 support person aged 46 or older with them. For treatment visits, patients cannot have anyone with them due to current Covid guidelines and our immunocompromised population.

## 2021-11-07 ENCOUNTER — Other Ambulatory Visit: Payer: Self-pay

## 2021-11-07 ENCOUNTER — Ambulatory Visit: Payer: Medicare Other

## 2021-11-07 ENCOUNTER — Telehealth: Payer: Self-pay | Admitting: Physician Assistant

## 2021-11-07 ENCOUNTER — Other Ambulatory Visit: Payer: Self-pay | Admitting: Neurology

## 2021-11-07 ENCOUNTER — Inpatient Hospital Stay: Payer: Medicare Other

## 2021-11-07 VITALS — BP 112/63 | HR 90 | Temp 98.7°F | Resp 18

## 2021-11-07 DIAGNOSIS — Z5111 Encounter for antineoplastic chemotherapy: Secondary | ICD-10-CM | POA: Diagnosis not present

## 2021-11-07 DIAGNOSIS — D509 Iron deficiency anemia, unspecified: Secondary | ICD-10-CM

## 2021-11-07 DIAGNOSIS — Z510 Encounter for antineoplastic radiation therapy: Secondary | ICD-10-CM | POA: Diagnosis not present

## 2021-11-07 DIAGNOSIS — C3490 Malignant neoplasm of unspecified part of unspecified bronchus or lung: Secondary | ICD-10-CM

## 2021-11-07 DIAGNOSIS — C3411 Malignant neoplasm of upper lobe, right bronchus or lung: Secondary | ICD-10-CM | POA: Diagnosis not present

## 2021-11-07 MED ORDER — LORATADINE 10 MG PO TABS
10.0000 mg | ORAL_TABLET | Freq: Once | ORAL | Status: AC
  Administered 2021-11-07: 10 mg via ORAL
  Filled 2021-11-07: qty 1

## 2021-11-07 MED ORDER — SODIUM CHLORIDE 0.9% FLUSH
10.0000 mL | INTRAVENOUS | Status: DC | PRN
  Administered 2021-11-07: 10 mL

## 2021-11-07 MED ORDER — HEPARIN SOD (PORK) LOCK FLUSH 100 UNIT/ML IV SOLN
500.0000 [IU] | Freq: Once | INTRAVENOUS | Status: AC | PRN
  Administered 2021-11-07: 500 [IU]

## 2021-11-07 MED ORDER — SODIUM CHLORIDE 0.9 % IV SOLN
300.0000 mg | Freq: Once | INTRAVENOUS | Status: AC
  Administered 2021-11-07: 300 mg via INTRAVENOUS
  Filled 2021-11-07: qty 300

## 2021-11-07 MED ORDER — ACETAMINOPHEN 325 MG PO TABS
650.0000 mg | ORAL_TABLET | Freq: Once | ORAL | Status: AC
  Administered 2021-11-07: 650 mg via ORAL
  Filled 2021-11-07: qty 2

## 2021-11-07 MED ORDER — SODIUM CHLORIDE 0.9 % IV SOLN: Freq: Once | INTRAVENOUS | Status: AC

## 2021-11-07 MED ORDER — SODIUM CHLORIDE 0.9 % IV SOLN
10.0000 mg | Freq: Once | INTRAVENOUS | Status: AC
  Administered 2021-11-07: 10 mg via INTRAVENOUS
  Filled 2021-11-07: qty 10

## 2021-11-07 MED ORDER — SODIUM CHLORIDE 0.9 % IV SOLN
100.0000 mg/m2 | Freq: Once | INTRAVENOUS | Status: AC
  Administered 2021-11-07: 150 mg via INTRAVENOUS
  Filled 2021-11-07: qty 7.5

## 2021-11-07 MED FILL — Dexamethasone Sodium Phosphate Inj 100 MG/10ML: INTRAMUSCULAR | Qty: 1 | Status: AC

## 2021-11-07 NOTE — Patient Instructions (Signed)
[name]  Discharge Instructions: Thank you for choosing [facility] to provide your oncology and hematology care.   If you have a lab appointment with the [facility], please go directly to the [facility] and check in at the registration area.   Wear comfortable clothing and clothing appropriate for easy access to any Portacath or PICC line.   We strive to give you quality time with your provider. You may need to reschedule your appointment if you arrive late (15 or more minutes).  Arriving late affects you and other patients whose appointments are after yours.  Also, if you miss three or more appointments without notifying the office, you may be dismissed from the clinic at the providers discretion.      For prescription refill requests, have your pharmacy contact our office and allow 72 hours for refills to be completed.    Today you received the following chemotherapy and/or immunotherapy agents: Etoposide     To help prevent nausea and vomiting after your treatment, we encourage you to take your nausea medication as directed.  BELOW ARE SYMPTOMS THAT SHOULD BE REPORTED IMMEDIATELY: *FEVER GREATER THAN 100.4 F (38 C) OR HIGHER *CHILLS OR SWEATING *NAUSEA AND VOMITING THAT IS NOT CONTROLLED WITH YOUR NAUSEA MEDICATION *UNUSUAL SHORTNESS OF BREATH *UNUSUAL BRUISING OR BLEEDING *URINARY PROBLEMS (pain or burning when urinating, or frequent urination) *BOWEL PROBLEMS (unusual diarrhea, constipation, pain near the anus) TENDERNESS IN MOUTH AND THROAT WITH OR WITHOUT PRESENCE OF ULCERS (sore throat, sores in mouth, or a toothache) UNUSUAL RASH, SWELLING OR PAIN  UNUSUAL VAGINAL DISCHARGE OR ITCHING   Items with * indicate a potential emergency and should be followed up as soon as possible or go to the Emergency Department if any problems should occur.  Please show the CHEMOTHERAPY ALERT CARD or IMMUNOTHERAPY ALERT CARD at check-in to  the Emergency Department and triage nurse.  Should you have questions after your visit or need to cancel or reschedule your appointment, please contact [facility]  Dept: [phone]  and follow the prompts.  Office hours are 8:00 a.m. to 4:30 p.m. Monday - Friday. Please note that voicemails left after 4:00 p.m. may not be returned until the following business day.  We are closed weekends and major holidays. You have access to a nurse at all times for urgent questions. Please call the main number to the clinic Dept: [phone] and follow the prompts.   For any non-urgent questions, you may also contact your provider using MyChart. We now offer e-Visits for anyone 26 and older to request care online for non-urgent symptoms. For details visit [email or web address].   Also download the MyChart app! Go to the app store, search "MyChart", open the app, select [facility], and log in with your MyChart username and password.  Due to Covid, a mask is required upon entering the hospital/clinic. If you do not have a mask, one will be given to you upon arrival. For doctor visits, patients may have 1 support person aged 82 or older with them. For treatment visits, patients cannot have anyone with them due to current Covid guidelines and our immunocompromised population.   Iron Sucrose Injection What is this medication? IRON SUCROSE (EYE ern SOO krose) treats low levels of iron (iron deficiency anemia) in people with kidney disease. Iron is a mineral that plays an important role in making red blood cells, which carry oxygen from your lungs to the rest of  your body. This medicine may be used for other purposes; ask your health care provider or pharmacist if you have questions. COMMON BRAND NAME(S): Venofer What should I tell my care team before I take this medication? They need to know if you have any of these conditions: Anemia not caused by low iron levels Heart  disease High levels of iron in the blood Kidney disease Liver disease An unusual or allergic reaction to iron, other medications, foods, dyes, or preservatives Pregnant or trying to get pregnant Breast-feeding How should I use this medication? This medication is for infusion into a vein. It is given in a hospital or clinic setting. Talk to your care team about the use of this medication in children. While this medication may be prescribed for children as young as 2 years for selected conditions, precautions do apply. Overdosage: If you think you have taken too much of this medicine contact a poison control center or emergency room at once. NOTE: This medicine is only for you. Do not share this medicine with others. What if I miss a dose? It is important not to miss your dose. Call your care team if you are unable to keep an appointment. What may interact with this medication? Do not take this medication with any of the following: Deferoxamine Dimercaprol Other iron products This medication may also interact with the following: Chloramphenicol Deferasirox This list may not describe all possible interactions. Give your health care provider a list of all the medicines, herbs, non-prescription drugs, or dietary supplements you use. Also tell them if you smoke, drink alcohol, or use illegal drugs. Some items may interact with your medicine. What should I watch for while using this medication? Visit your care team regularly. Tell your care team if your symptoms do not start to get better or if they get worse. You may need blood work done while you are taking this medication. You may need to follow a special diet. Talk to your care team. Foods that contain iron include: whole grains/cereals, dried fruits, beans, or peas, leafy green vegetables, and organ meats (liver, kidney). What side effects may I notice from receiving this medication? Side effects that you should report to your care team as  soon as possible: Allergic reactions--skin rash, itching, hives, swelling of the face, lips, tongue, or throat Low blood pressure--dizziness, feeling faint or lightheaded, blurry vision Shortness of breath Side effects that usually do not require medical attention (report to your care team if they continue or are bothersome): Flushing Headache Joint pain Muscle pain Nausea Pain, redness, or irritation at injection site This list may not describe all possible side effects. Call your doctor for medical advice about side effects. You may report side effects to FDA at 1-800-FDA-1088. Where should I keep my medication? This medication is given in a hospital or clinic and will not be stored at home. NOTE: This sheet is a summary. It may not cover all possible information. If you have questions about this medicine, talk to your doctor, pharmacist, or health care provider.  2022 Elsevier/Gold Standard (2021-02-10 00:00:00)

## 2021-11-07 NOTE — Progress Notes (Signed)
Venofer 300mg  x 1 today. Cassie will cancel Venofer on 11/14/21 and notify patient not to come for infusion.  Raul Del Baldwin, [place], BCPS, BCOP 11/07/2021 4:14 PM

## 2021-11-07 NOTE — Telephone Encounter (Signed)
I called the patient to let her know that she does not need to get IV iron next week on 2/14. I have canceled that appointment; however, she still does need to come in for labs as scheduled. She comes in tomorrow for radiation. I encouraged her to pick up a copy of her schedule for radiation as it looks like they have her scheduled into march. I also reminded her tomorrow to make sure she checks back into the lobby after radiation for her chemotherapy.

## 2021-11-08 ENCOUNTER — Ambulatory Visit
Admission: RE | Admit: 2021-11-08 | Discharge: 2021-11-08 | Disposition: A | Discharge status: Home / Self Care | Payer: Medicare Other | Source: Ambulatory Visit | Attending: Radiation Oncology | Admitting: Radiation Oncology

## 2021-11-08 ENCOUNTER — Inpatient Hospital Stay: Payer: Medicare Other

## 2021-11-08 VITALS — BP 120/61 | HR 88 | Temp 98.1°F | Resp 16

## 2021-11-08 DIAGNOSIS — C3411 Malignant neoplasm of upper lobe, right bronchus or lung: Secondary | ICD-10-CM | POA: Diagnosis not present

## 2021-11-08 DIAGNOSIS — D509 Iron deficiency anemia, unspecified: Secondary | ICD-10-CM | POA: Diagnosis not present

## 2021-11-08 DIAGNOSIS — C3490 Malignant neoplasm of unspecified part of unspecified bronchus or lung: Secondary | ICD-10-CM

## 2021-11-08 DIAGNOSIS — C3491 Malignant neoplasm of unspecified part of right bronchus or lung: Secondary | ICD-10-CM

## 2021-11-08 DIAGNOSIS — Z5111 Encounter for antineoplastic chemotherapy: Secondary | ICD-10-CM | POA: Diagnosis not present

## 2021-11-08 DIAGNOSIS — Z510 Encounter for antineoplastic radiation therapy: Secondary | ICD-10-CM | POA: Insufficient documentation

## 2021-11-08 MED ORDER — SODIUM CHLORIDE 0.9% FLUSH
10.0000 mL | INTRAVENOUS | Status: DC | PRN
  Administered 2021-11-08: 10 mL

## 2021-11-08 MED ORDER — SODIUM CHLORIDE 0.9 % IV SOLN: Freq: Once | INTRAVENOUS | Status: AC

## 2021-11-08 MED ORDER — SODIUM CHLORIDE 0.9 % IV SOLN
10.0000 mg | Freq: Once | INTRAVENOUS | Status: AC
  Administered 2021-11-08: 10 mg via INTRAVENOUS
  Filled 2021-11-08: qty 10

## 2021-11-08 MED ORDER — SODIUM CHLORIDE 0.9 % IV SOLN
100.0000 mg/m2 | Freq: Once | INTRAVENOUS | Status: AC
  Administered 2021-11-08: 150 mg via INTRAVENOUS
  Filled 2021-11-08: qty 7.5

## 2021-11-08 MED ORDER — HEPARIN SOD (PORK) LOCK FLUSH 100 UNIT/ML IV SOLN
500.0000 [IU] | Freq: Once | INTRAVENOUS | Status: AC | PRN
  Administered 2021-11-08: 500 [IU]

## 2021-11-08 NOTE — Patient Instructions (Signed)
[facility]  Discharge Instructions: Thank you for choosing [facility] to provide your oncology and hematology care.   If you have a lab appointment with the Cancer Center, please go directly to the Cancer Center and check in at the registration area.   Wear comfortable clothing and clothing appropriate for easy access to any Portacath or PICC line.   We strive to give you quality time with your provider. You may need to reschedule your appointment if you arrive late (15 or more minutes).  Arriving late affects you and other patients whose appointments are after yours.  Also, if you miss three or more appointments without notifying the office, you may be dismissed from the clinic at the provider's discretion.      For prescription refill requests, have your pharmacy contact our office and allow 72 hours for refills to be completed.    Today you received the following chemotherapy and/or immunotherapy agents: etoposide.     To help prevent nausea and vomiting after your treatment, we encourage you to take your nausea medication as directed.  BELOW ARE SYMPTOMS THAT SHOULD BE REPORTED IMMEDIATELY: . *FEVER GREATER THAN 100.4 F (38 C) OR HIGHER . *CHILLS OR SWEATING . *NAUSEA AND VOMITING THAT IS NOT CONTROLLED WITH YOUR NAUSEA MEDICATION . *UNUSUAL SHORTNESS OF BREATH . *UNUSUAL BRUISING OR BLEEDING . *URINARY PROBLEMS (pain or burning when urinating, or frequent urination) . *BOWEL PROBLEMS (unusual diarrhea, constipation, pain near the anus) . TENDERNESS IN MOUTH AND THROAT WITH OR WITHOUT PRESENCE OF ULCERS (sore throat, sores in mouth, or a toothache) . UNUSUAL RASH, SWELLING OR PAIN  . UNUSUAL VAGINAL DISCHARGE OR ITCHING   Items with * indicate a potential emergency and should be followed up as soon as possible or go to the Emergency Department if any problems should occur.  Please show the CHEMOTHERAPY ALERT CARD or IMMUNOTHERAPY ALERT  CARD at check-in to the Emergency Department and triage nurse.  Should you have questions after your visit or need to cancel or reschedule your appointment, please contact [facility]  Dept: [phone]  and follow the prompts.  Office hours are 8:00 a.m. to 4:30 p.m. Monday - Friday. Please note that voicemails left after 4:00 p.m. may not be returned until the following business day.  We are closed weekends and major holidays. You have access to a nurse at all times for urgent questions. Please call the main number to the clinic Dept: [phone] and follow the prompts.   For any non-urgent questions, you may also contact your provider using MyChart. We now offer e-Visits for anyone 18 and older to request care online for non-urgent symptoms. For details visit [email or web address].   Also download the MyChart app! Go to the app store, search "MyChart", open the app, select [facility], and log in with your MyChart username and password.  Due to Covid, a mask is required upon entering the hospital/clinic. If you do not have a mask, one will be given to you upon arrival. For doctor visits, patients may have 1 support person aged 18 or older with them. For treatment visits, patients cannot have anyone with them due to current Covid guidelines and our immunocompromised population.   

## 2021-11-08 NOTE — Telephone Encounter (Signed)
Patient called and informed her of Dr. Claris Gladden advice. Pt. Very thankful for the call back.

## 2021-11-09 ENCOUNTER — Ambulatory Visit: Payer: Medicare Other | Admitting: Radiation Oncology

## 2021-11-09 ENCOUNTER — Ambulatory Visit: Payer: Medicare Other

## 2021-11-13 DIAGNOSIS — Z510 Encounter for antineoplastic radiation therapy: Secondary | ICD-10-CM | POA: Diagnosis not present

## 2021-11-13 DIAGNOSIS — Z5111 Encounter for antineoplastic chemotherapy: Secondary | ICD-10-CM | POA: Diagnosis not present

## 2021-11-13 DIAGNOSIS — D509 Iron deficiency anemia, unspecified: Secondary | ICD-10-CM | POA: Diagnosis not present

## 2021-11-13 DIAGNOSIS — C3411 Malignant neoplasm of upper lobe, right bronchus or lung: Secondary | ICD-10-CM | POA: Diagnosis not present

## 2021-11-14 ENCOUNTER — Inpatient Hospital Stay: Payer: Medicare Other

## 2021-11-14 ENCOUNTER — Encounter: Payer: Self-pay | Admitting: Internal Medicine

## 2021-11-14 ENCOUNTER — Other Ambulatory Visit: Payer: Medicare Other

## 2021-11-14 ENCOUNTER — Ambulatory Visit: Payer: Medicare Other

## 2021-11-14 NOTE — Progress Notes (Signed)
Patient called to inquire about grant. Advised patient she was to bring income documentation at visit on 1/16 and I haven't received as of yet.  Patient states she will bring tomorrow 2/15 and ask for me after her Radiation to complete process.  She has my card for any additional financial questions or concerns.

## 2021-11-15 ENCOUNTER — Ambulatory Visit
Admission: RE | Admit: 2021-11-15 | Discharge: 2021-11-15 | Disposition: A | Discharge status: Home / Self Care | Payer: Medicare Other | Source: Ambulatory Visit | Attending: Radiation Oncology | Admitting: Radiation Oncology

## 2021-11-15 ENCOUNTER — Encounter: Payer: Self-pay | Admitting: Internal Medicine

## 2021-11-15 ENCOUNTER — Other Ambulatory Visit: Payer: Self-pay

## 2021-11-15 ENCOUNTER — Inpatient Hospital Stay: Payer: Medicare Other

## 2021-11-15 DIAGNOSIS — D6481 Anemia due to antineoplastic chemotherapy: Secondary | ICD-10-CM

## 2021-11-15 DIAGNOSIS — Z5111 Encounter for antineoplastic chemotherapy: Secondary | ICD-10-CM | POA: Diagnosis not present

## 2021-11-15 DIAGNOSIS — C3411 Malignant neoplasm of upper lobe, right bronchus or lung: Secondary | ICD-10-CM | POA: Diagnosis not present

## 2021-11-15 DIAGNOSIS — Z95828 Presence of other vascular implants and grafts: Secondary | ICD-10-CM

## 2021-11-15 DIAGNOSIS — Z510 Encounter for antineoplastic radiation therapy: Secondary | ICD-10-CM | POA: Diagnosis not present

## 2021-11-15 DIAGNOSIS — C3491 Malignant neoplasm of unspecified part of right bronchus or lung: Secondary | ICD-10-CM

## 2021-11-15 DIAGNOSIS — D509 Iron deficiency anemia, unspecified: Secondary | ICD-10-CM | POA: Diagnosis not present

## 2021-11-15 LAB — CBC WITH DIFFERENTIAL (CANCER CENTER ONLY)
Abs Immature Granulocytes: 0.01 10*3/uL (ref 0.00–0.07)
Basophils Absolute: 0 10*3/uL (ref 0.0–0.1)
Basophils Relative: 1 %
Eosinophils Absolute: 0 10*3/uL (ref 0.0–0.5)
Eosinophils Relative: 1 %
HCT: 30.7 % — ABNORMAL LOW (ref 36.0–46.0)
Hemoglobin: 9.6 g/dL — ABNORMAL LOW (ref 12.0–15.0)
Immature Granulocytes: 0 %
Lymphocytes Relative: 31 %
Lymphs Abs: 0.8 10*3/uL (ref 0.7–4.0)
MCH: 24.7 pg — ABNORMAL LOW (ref 26.0–34.0)
MCHC: 31.3 g/dL (ref 30.0–36.0)
MCV: 78.9 fL — ABNORMAL LOW (ref 80.0–100.0)
Monocytes Absolute: 0.3 10*3/uL (ref 0.1–1.0)
Monocytes Relative: 9 %
Neutro Abs: 1.5 10*3/uL — ABNORMAL LOW (ref 1.7–7.7)
Neutrophils Relative %: 58 %
Platelet Count: 182 10*3/uL (ref 150–400)
RBC: 3.89 MIL/uL (ref 3.87–5.11)
RDW: 21.6 % — ABNORMAL HIGH (ref 11.5–15.5)
WBC Count: 2.7 10*3/uL — ABNORMAL LOW (ref 4.0–10.5)
nRBC: 0 % (ref 0.0–0.2)

## 2021-11-15 LAB — CMP (CANCER CENTER ONLY)
ALT: 18 U/L (ref 0–44)
AST: 20 U/L (ref 15–41)
Albumin: 4.2 g/dL (ref 3.5–5.0)
Alkaline Phosphatase: 79 U/L (ref 38–126)
Anion gap: 8 (ref 5–15)
BUN: 28 mg/dL — ABNORMAL HIGH (ref 8–23)
CO2: 32 mmol/L (ref 22–32)
Calcium: 9.6 mg/dL (ref 8.9–10.3)
Chloride: 93 mmol/L — ABNORMAL LOW (ref 98–111)
Creatinine: 0.89 mg/dL (ref 0.44–1.00)
GFR, Estimated: >60 mL/min (ref >60)
Glucose, Bld: 210 mg/dL — ABNORMAL HIGH (ref 70–99)
Potassium: 3.7 mmol/L (ref 3.5–5.1)
Sodium: 133 mmol/L — ABNORMAL LOW (ref 135–145)
Total Bilirubin: 0.4 mg/dL (ref 0.3–1.2)
Total Protein: 7.1 g/dL (ref 6.5–8.1)

## 2021-11-15 LAB — SAMPLE TO BLOOD BANK

## 2021-11-15 MED ORDER — SODIUM CHLORIDE 0.9% FLUSH
10.0000 mL | INTRAVENOUS | Status: DC | PRN
  Administered 2021-11-15: 10 mL via INTRAVENOUS

## 2021-11-15 MED ORDER — HEPARIN SOD (PORK) LOCK FLUSH 100 UNIT/ML IV SOLN
500.0000 [IU] | Freq: Once | INTRAVENOUS | Status: AC
  Administered 2021-11-15: 500 [IU] via INTRAVENOUS

## 2021-11-15 NOTE — Progress Notes (Signed)
Met with patient at registration to complete grant process.  Patient approved for one-time $1000 [employer] grant to assist with personal expenses while going through treatment. Discussed in detail expenses and how they are covered. She received a gift card today from her grant.  She has a copy of approval letter and expense sheet along with the Outpatient Pharmacy information in green folder and for any additional financial questions or concerns.

## 2021-11-16 ENCOUNTER — Ambulatory Visit
Admission: RE | Admit: 2021-11-16 | Discharge: 2021-11-16 | Disposition: A | Discharge status: Home / Self Care | Payer: Medicare Other | Source: Ambulatory Visit | Attending: Radiation Oncology | Admitting: Radiation Oncology

## 2021-11-16 DIAGNOSIS — D509 Iron deficiency anemia, unspecified: Secondary | ICD-10-CM | POA: Diagnosis not present

## 2021-11-16 DIAGNOSIS — Z510 Encounter for antineoplastic radiation therapy: Secondary | ICD-10-CM | POA: Diagnosis not present

## 2021-11-16 DIAGNOSIS — C3411 Malignant neoplasm of upper lobe, right bronchus or lung: Secondary | ICD-10-CM | POA: Diagnosis not present

## 2021-11-16 DIAGNOSIS — Z5111 Encounter for antineoplastic chemotherapy: Secondary | ICD-10-CM | POA: Diagnosis not present

## 2021-11-17 ENCOUNTER — Ambulatory Visit
Admission: RE | Admit: 2021-11-17 | Discharge: 2021-11-17 | Disposition: A | Discharge status: Home / Self Care | Payer: Medicare Other | Source: Ambulatory Visit | Attending: Radiation Oncology | Admitting: Radiation Oncology

## 2021-11-17 ENCOUNTER — Other Ambulatory Visit: Payer: Self-pay

## 2021-11-17 DIAGNOSIS — D509 Iron deficiency anemia, unspecified: Secondary | ICD-10-CM | POA: Diagnosis not present

## 2021-11-17 DIAGNOSIS — Z510 Encounter for antineoplastic radiation therapy: Secondary | ICD-10-CM | POA: Diagnosis not present

## 2021-11-17 DIAGNOSIS — C3411 Malignant neoplasm of upper lobe, right bronchus or lung: Secondary | ICD-10-CM | POA: Diagnosis not present

## 2021-11-17 DIAGNOSIS — Z5111 Encounter for antineoplastic chemotherapy: Secondary | ICD-10-CM | POA: Diagnosis not present

## 2021-11-20 ENCOUNTER — Ambulatory Visit
Admission: RE | Admit: 2021-11-20 | Discharge: 2021-11-20 | Disposition: A | Discharge status: Home / Self Care | Payer: Medicare Other | Source: Ambulatory Visit | Attending: Radiation Oncology | Admitting: Radiation Oncology

## 2021-11-20 ENCOUNTER — Encounter (HOSPITAL_COMMUNITY): Payer: Self-pay

## 2021-11-20 ENCOUNTER — Other Ambulatory Visit: Payer: Self-pay

## 2021-11-20 DIAGNOSIS — D509 Iron deficiency anemia, unspecified: Secondary | ICD-10-CM | POA: Diagnosis not present

## 2021-11-20 DIAGNOSIS — Z510 Encounter for antineoplastic radiation therapy: Secondary | ICD-10-CM | POA: Diagnosis not present

## 2021-11-20 DIAGNOSIS — Z5111 Encounter for antineoplastic chemotherapy: Secondary | ICD-10-CM | POA: Diagnosis not present

## 2021-11-20 DIAGNOSIS — C3411 Malignant neoplasm of upper lobe, right bronchus or lung: Secondary | ICD-10-CM | POA: Diagnosis not present

## 2021-11-21 ENCOUNTER — Inpatient Hospital Stay: Payer: Medicare Other

## 2021-11-21 ENCOUNTER — Other Ambulatory Visit: Payer: Self-pay | Admitting: Medical Oncology

## 2021-11-21 ENCOUNTER — Ambulatory Visit
Admission: RE | Admit: 2021-11-21 | Discharge: 2021-11-21 | Disposition: A | Discharge status: Home / Self Care | Payer: Medicare Other | Source: Ambulatory Visit | Attending: Radiation Oncology | Admitting: Radiation Oncology

## 2021-11-21 DIAGNOSIS — Z510 Encounter for antineoplastic radiation therapy: Secondary | ICD-10-CM | POA: Diagnosis not present

## 2021-11-21 DIAGNOSIS — C3411 Malignant neoplasm of upper lobe, right bronchus or lung: Secondary | ICD-10-CM | POA: Diagnosis not present

## 2021-11-21 DIAGNOSIS — Z5111 Encounter for antineoplastic chemotherapy: Secondary | ICD-10-CM | POA: Diagnosis not present

## 2021-11-21 DIAGNOSIS — C3491 Malignant neoplasm of unspecified part of right bronchus or lung: Secondary | ICD-10-CM

## 2021-11-21 DIAGNOSIS — D6481 Anemia due to antineoplastic chemotherapy: Secondary | ICD-10-CM

## 2021-11-21 DIAGNOSIS — Z95828 Presence of other vascular implants and grafts: Secondary | ICD-10-CM

## 2021-11-21 DIAGNOSIS — T451X5A Adverse effect of antineoplastic and immunosuppressive drugs, initial encounter: Secondary | ICD-10-CM

## 2021-11-21 DIAGNOSIS — D509 Iron deficiency anemia, unspecified: Secondary | ICD-10-CM | POA: Diagnosis not present

## 2021-11-21 LAB — CMP (CANCER CENTER ONLY)
ALT: 15 U/L (ref 0–44)
AST: 13 U/L — ABNORMAL LOW (ref 15–41)
Albumin: 4.2 g/dL (ref 3.5–5.0)
Alkaline Phosphatase: 77 U/L (ref 38–126)
Anion gap: 8 (ref 5–15)
BUN: 18 mg/dL (ref 8–23)
CO2: 31 mmol/L (ref 22–32)
Calcium: 9.5 mg/dL (ref 8.9–10.3)
Chloride: 94 mmol/L — ABNORMAL LOW (ref 98–111)
Creatinine: 0.82 mg/dL (ref 0.44–1.00)
GFR, Estimated: >60 mL/min (ref >60)
Glucose, Bld: 237 mg/dL — ABNORMAL HIGH (ref 70–99)
Potassium: 3.6 mmol/L (ref 3.5–5.1)
Sodium: 133 mmol/L — ABNORMAL LOW (ref 135–145)
Total Bilirubin: 0.4 mg/dL (ref 0.3–1.2)
Total Protein: 7 g/dL (ref 6.5–8.1)

## 2021-11-21 LAB — CBC WITH DIFFERENTIAL (CANCER CENTER ONLY)
Abs Immature Granulocytes: 0.02 10*3/uL (ref 0.00–0.07)
Basophils Absolute: 0 10*3/uL (ref 0.0–0.1)
Basophils Relative: 1 %
Eosinophils Absolute: 0.1 10*3/uL (ref 0.0–0.5)
Eosinophils Relative: 3 %
HCT: 30.3 % — ABNORMAL LOW (ref 36.0–46.0)
Hemoglobin: 9.7 g/dL — ABNORMAL LOW (ref 12.0–15.0)
Immature Granulocytes: 1 %
Lymphocytes Relative: 41 %
Lymphs Abs: 0.8 10*3/uL (ref 0.7–4.0)
MCH: 26.2 pg (ref 26.0–34.0)
MCHC: 32 g/dL (ref 30.0–36.0)
MCV: 81.9 fL (ref 80.0–100.0)
Monocytes Absolute: 0.4 10*3/uL (ref 0.1–1.0)
Monocytes Relative: 21 %
Neutro Abs: 0.6 10*3/uL — ABNORMAL LOW (ref 1.7–7.7)
Neutrophils Relative %: 33 %
Platelet Count: 180 10*3/uL (ref 150–400)
RBC: 3.7 MIL/uL — ABNORMAL LOW (ref 3.87–5.11)
RDW: 25.7 % — ABNORMAL HIGH (ref 11.5–15.5)
WBC Count: 1.9 10*3/uL — ABNORMAL LOW (ref 4.0–10.5)
nRBC: 2.1 % — ABNORMAL HIGH (ref 0.0–0.2)

## 2021-11-21 LAB — SAMPLE TO BLOOD BANK

## 2021-11-21 MED ORDER — HEPARIN SOD (PORK) LOCK FLUSH 100 UNIT/ML IV SOLN
500.0000 [IU] | INTRAVENOUS | Status: AC | PRN
  Administered 2021-11-21: 500 [IU]

## 2021-11-21 MED ORDER — SONAFINE EX EMUL
1.0000 "application " | Freq: Once | CUTANEOUS | Status: AC
  Administered 2021-11-21: 1 via TOPICAL

## 2021-11-21 MED ORDER — SODIUM CHLORIDE 0.9% FLUSH
10.0000 mL | INTRAVENOUS | Status: AC | PRN
  Administered 2021-11-21: 10 mL

## 2021-11-22 ENCOUNTER — Telehealth: Payer: Self-pay

## 2021-11-22 ENCOUNTER — Ambulatory Visit
Admission: RE | Admit: 2021-11-22 | Discharge: 2021-11-22 | Disposition: A | Discharge status: Home / Self Care | Payer: Medicare Other | Source: Ambulatory Visit | Attending: Radiation Oncology | Admitting: Radiation Oncology

## 2021-11-22 DIAGNOSIS — C3411 Malignant neoplasm of upper lobe, right bronchus or lung: Secondary | ICD-10-CM | POA: Diagnosis not present

## 2021-11-22 DIAGNOSIS — D509 Iron deficiency anemia, unspecified: Secondary | ICD-10-CM | POA: Diagnosis not present

## 2021-11-22 DIAGNOSIS — Z5111 Encounter for antineoplastic chemotherapy: Secondary | ICD-10-CM | POA: Diagnosis not present

## 2021-11-22 DIAGNOSIS — Z510 Encounter for antineoplastic radiation therapy: Secondary | ICD-10-CM | POA: Diagnosis not present

## 2021-11-22 NOTE — Telephone Encounter (Signed)
I spoke with pt and advised of her results. Neutropenic precautions were reviewed with her. She has been advised to practice good hygiene (as well as those who come and live in her home), staying away from crowds, and avoiding food that might have germs (i.e. under cooked meat). She was further advised that any sign of infection should be taken seriously and to go to an emergency room if she has symptoms like fever, diarrhea, or chills. Pt expressed understanding of this information.

## 2021-11-22 NOTE — Telephone Encounter (Signed)
-----   Message from [employer], PA-C sent at 11/21/2021  4:18 PM EST ----- Can you call her and review neutropenic precautions with her? Dr. Julien Nordmann would like to watch her counts for now.  ----- Message ----- From: Interface, Lab In [facility] Sent: 11/21/2021   2:55 PM EST To: Cassandra L Heilingoetter, PA-C

## 2021-11-23 ENCOUNTER — Ambulatory Visit
Admission: RE | Admit: 2021-11-23 | Discharge: 2021-11-23 | Disposition: A | Discharge status: Home / Self Care | Payer: Medicare Other | Source: Ambulatory Visit | Attending: Radiation Oncology | Admitting: Radiation Oncology

## 2021-11-23 ENCOUNTER — Inpatient Hospital Stay: Payer: Medicare Other

## 2021-11-23 ENCOUNTER — Other Ambulatory Visit: Payer: Self-pay

## 2021-11-23 DIAGNOSIS — Z5111 Encounter for antineoplastic chemotherapy: Secondary | ICD-10-CM | POA: Diagnosis not present

## 2021-11-23 DIAGNOSIS — Z510 Encounter for antineoplastic radiation therapy: Secondary | ICD-10-CM | POA: Diagnosis not present

## 2021-11-23 DIAGNOSIS — D509 Iron deficiency anemia, unspecified: Secondary | ICD-10-CM | POA: Diagnosis not present

## 2021-11-23 DIAGNOSIS — C3411 Malignant neoplasm of upper lobe, right bronchus or lung: Secondary | ICD-10-CM | POA: Diagnosis not present

## 2021-11-23 NOTE — Progress Notes (Signed)
[facility] OFFICE PROGRESS NOTE  Tower, [name], MD [place] [address]  DIAGNOSIS:  1) Limited stage (T3, N2, M0) small cell lung cancer presented with right hilar and mediastinal lymphadenopathy. There was no hypermetabolism in the left adrenal gland lesion. Diagnosed in December 2022. 2)  Iron Deficiency Anemia  PRIOR THERAPY: 1) Systemic chemotherapy with systemic chemotherapy with carboplatin for an AUC of 5, etoposide 100 mg per metered squared, and Imfinzi 1500 mg IV every 3 weeks with Cosela for myeloprotection.  First dose on 10/16/2021.  Discontinued after 1 cycle as the patient staging PET scan showed limited stage disease.  CURRENT THERAPY: 1) systemic chemotherapy with carboplatin for an AUC of 5 on day 1 and etoposide 100 mg per metered squared on days 1, 2, and 3 IV every 3 weeks.  First dose expected on 11/06/2021.  2) Venofer infusions as needed. Last dose on 11/07/21   INTERVAL HISTORY: Diane [name] 66 y.o. female returns to the clinic today for a follow-up visit.  The patient was recently diagnosed with limited stage small cell lung cancer.  She is currently undergoing chemotherapy and radiation treatment.  Despite the patient's multiple comorbidities, she has been tolerating treatment fairly well except for some controlled nausea without vomiting. She uses phenergan, which is more effective for her than compazine.  In the interval with her weekly lab draws, the patient did have evidence of neutropenia.  The patient denies any recent fever, chills, night sweats, dental pain, ear infections, sinus infections, skin infections, dysuria, abdominal pain, or diarrhea.  She reports her baseline dyspnea on exertion and chronic cough. She has not needed to take anything for cough. She reports some congestion.  Denies any hemoptysis or chest pain.  Denies any constipation because she uses miralax. Denies any headache or visual changes except for her  baseline visual changes due to cataracts, which she is planning on having surgery once she completes her treatment.  He is here today for evaluation and repeat blood work before starting cycle #2.    MEDICAL HISTORY: Past Medical History:  Diagnosis Date   Anemia    Arthritis    Carotid stenosis    a. s/p right carotid stent 10/2017.   Chronic systolic CHF (congestive heart failure) (HCC)    Colon polyps    COPD (chronic obstructive pulmonary disease) (HCC)    Coronary artery disease    post CABG in 3/07 , coronary stents    Depression    after major stroke years ago, no longer treated   Diabetes mellitus    type 2   Dyslipidemia    Fatty liver    Fibromyalgia    GERD (gastroesophageal reflux disease)    Headache    hx of    History of kidney stones    Hyperlipidemia    Hypertension    Myocardial infarction ([place])    Pneumonia    hx of    Seizures ([place])    last seizure- 03/2013    Shortness of breath dyspnea    with exertion or when fluid builds up    Sleep apnea    used to wear a cpap- not used in 3 years    Status post dilation of esophageal narrowing    Stroke [facility]) 1993   problems with balance    Systolic murmur    known mild AS and MR   Thyroid goiter    Tobacco abuse  ALLERGIES:  is allergic to bee venom, benadryl [diphenhydramine], entresto [sacubitril-valsartan], farxiga [dapagliflozin], tetracycline, canagliflozin, sitagliptin, and ace inhibitors.  MEDICATIONS:  Current Outpatient Medications  Medication Sig Dispense Refill   acetaminophen (TYLENOL) 500 MG tablet Take 1,000 mg by mouth at bedtime.     albuterol (PROVENTIL) (2.5 MG/3ML) 0.083% nebulizer solution Take 3 mLs (2.5 mg total) by nebulization every 6 (six) hours as needed for wheezing or shortness of breath. 1180 mL 3   albuterol (VENTOLIN HFA) 108 (90 Base) MCG/ACT inhaler Inhale 2 puffs into the lungs every 6 (six) hours as needed for wheezing or shortness of breath. 24 g 3   aspirin EC 81  MG tablet Take 81 mg by mouth every evening.  30 tablet 6   BD PEN NEEDLE NANO 2ND GEN 32G X 4 MM MISC Inject into the skin 3 (three) times daily.     clopidogrel (PLAVIX) 75 MG tablet Take 1 tablet (75 mg total) by mouth daily. 90 tablet 3   dexlansoprazole (DEXILANT) 60 MG capsule Take 1 capsule (60 mg total) by mouth every evening.     digoxin (LANOXIN) 0.125 MG tablet Take 0.5 tablets (0.0625 mg total) by mouth daily. 45 tablet 3   diphenhydrAMINE (BENADRYL) 25 mg capsule Take 25 mg by mouth 2 (two) times daily as needed for itching or allergies.     ezetimibe (ZETIA) 10 MG tablet Take 1 tablet (10 mg total) by mouth daily. 90 tablet 3   fluticasone (FLONASE) 50 MCG/ACT nasal spray Place 2 sprays into both nostrils daily. (Patient taking differently: Place 2 sprays into both nostrils daily as needed for allergies.) 48 g 3   gabapentin (NEURONTIN) 300 MG capsule TAKE 2 CAPSULES DAILY AT   BEDTIME 180 capsule 1   glucose blood (ONETOUCH ULTRA) test strip 3 (three) times daily.     guaiFENesin (MUCINEX) 600 MG 12 hr tablet Take 2 tablets (1,200 mg total) by mouth 2 (two) times daily. 360 tablet 3   HYDROcodone-acetaminophen (NORCO) 5-325 MG tablet Take 1 tablet by mouth daily as needed for severe pain. 15 tablet 0   icosapent Ethyl (VASCEPA) 1 g capsule Take 2 capsules (2 g total) by mouth 2 (two) times daily. 360 capsule 3   insulin aspart protamine- aspart (NOVOLOG MIX 70/30) (70-30) 100 UNIT/ML injection Inject 5-65 Units into the skin See admin instructions. 65 units in the Am with breakfast and 5 units with lunch and 55 units with dinner     levETIRAcetam (KEPPRA) 1000 MG tablet Take 1 tablet (1,000 mg total) by mouth 2 (two) times daily.     lidocaine-prilocaine (EMLA) cream Apply to the Port-A-Cath site 30-60 minutes before chemotherapy (Patient not taking: Reported on 11/01/2021) 30 g 0   metFORMIN (GLUCOPHAGE-XR) 500 MG 24 hr tablet Take 500 mg by mouth every evening.   6   metolazone  (ZAROXOLYN) 2.5 MG tablet Take 1 tablet (2.5 mg total) by mouth 2 (two) times a week. Every Mon and Fri 30 tablet 3   nicotine (NICODERM CQ - DOSED IN MG/24 HOURS) 21 mg/24hr patch Place 21 mg onto the skin daily as needed (to cease smoking).     nicotine polacrilex (NICORETTE) 4 MG gum Take 4 mg by mouth daily as needed for smoking cessation.     nitroGLYCERIN (NITROLINGUAL) 0.4 MG/SPRAY spray Place 1 spray under the tongue every 5 (five) minutes x 3 doses as needed for chest pain. 12 g 3   nystatin (MYCOSTATIN/NYSTOP) powder APPLY 1 APPLICATION  TOPICALLY 4 TIMES A DAY AS NEEDED FOR YEAST INFECTIONS 60 g 5   ONE TOUCH ULTRA TEST test strip USE AS DIRECTED FOR TESTING BLOOD GLUCOSE 3 TIMES DAILY  1   polyethylene glycol (MIRALAX / GLYCOLAX) 17 g packet Take 17 g by mouth daily.     potassium chloride SA (KLOR-CON M20) 20 MEQ tablet Take 2 tablets (40 mEq total) by mouth every morning AND 1 tablet (20 mEq total) every evening. 270 tablet 3   prochlorperazine (COMPAZINE) 10 MG tablet Take 1 tablet (10 mg total) by mouth every 6 (six) hours as needed for nausea or vomiting. 30 tablet 0   promethazine (PHENERGAN) 12.5 MG tablet Take 1 tablet (12.5 mg total) by mouth every 6 (six) hours as needed for nausea or vomiting. 30 tablet 2   ranolazine (RANEXA) 1000 MG SR tablet Take 1 tablet (1,000 mg total) by mouth 2 (two) times daily. 180 tablet 3   rosuvastatin (CRESTOR) 20 MG tablet Take 1 tablet (20 mg total) by mouth at bedtime.     sodium chloride (OCEAN) 0.65 % SOLN nasal spray Place 1 spray into both nostrils as needed for congestion.     spironolactone (ALDACTONE) 25 MG tablet Take 1 tablet (25 mg total) by mouth at bedtime. TAKE 1 TABLET BY MOUTH EVERYDAY AT BEDTIME     torsemide (DEMADEX) 20 MG tablet Take 3 tablets (60 mg total) by mouth 2 (two) times daily. 540 tablet 3   triamcinolone cream (KENALOG) 0.5 % Apply 1 application topically 3 (three) times daily as needed (rash). To affected  areas     Vericiguat 10 MG TABS Take 10 mg by mouth daily.     No current facility-administered medications for this visit.    SURGICAL HISTORY:  Past Surgical History:  Procedure Laterality Date   ABDOMINAL AORTOGRAM W/LOWER EXTREMITY N/A 04/18/2020   Procedure: ABDOMINAL AORTOGRAM W/LOWER EXTREMITY;  Surgeon: Lorretta Harp, MD;  Location: [facility];  Service: Cardiovascular;  Laterality: N/A;   APPENDECTOMY     BIOPSY  02/03/2019   Procedure: BIOPSY;  Surgeon: Ladene Artist, MD;  Location: [facility] ENDOSCOPY;  Service: Endoscopy;;   BREAST BIOPSY Left 2016   BRONCHIAL BIOPSY  09/18/2021   Procedure: BRONCHIAL BIOPSIES;  Surgeon: Collene Gobble, MD;  Location: [facility] ENDOSCOPY;  Service: Pulmonary;;   BRONCHIAL BRUSHINGS  09/18/2021   Procedure: BRONCHIAL BRUSHINGS;  Surgeon: Collene Gobble, MD;  Location: [facility] ENDOSCOPY;  Service: Pulmonary;;   BRONCHIAL NEEDLE ASPIRATION BIOPSY  09/18/2021   Procedure: BRONCHIAL NEEDLE ASPIRATION BIOPSIES;  Surgeon: Collene Gobble, MD;  Location: [facility] ENDOSCOPY;  Service: Pulmonary;;   CARDIAC CATHETERIZATION  11/29/05   EF of 55%   CARDIAC CATHETERIZATION  08/06/06   EF of 45-50%   CARDIAC CATHETERIZATION N/A 05/06/2015   Procedure: Right/Left Heart Cath and Coronary/Graft Angiography;  Surgeon: Peter M Martinique, MD;  Location: [facility];  Service: Cardiovascular;  Laterality: N/A;   CAROTID PTA/STENT INTERVENTION N/A 10/10/2017   Procedure: CAROTID PTA/STENT INTERVENTION - Right;  Surgeon: Serafina Mitchell, MD;  Location: [facility];  Service: Cardiovascular;  Laterality: N/A;   CERVICAL FUSION  1990   CHOLECYSTECTOMY     COLON RESECTION     mass removed and 4 in of colon   COLONOSCOPY WITH PROPOFOL N/A 07/16/2017   Procedure: COLONOSCOPY WITH PROPOFOL;  Surgeon: Ladene Artist, MD;  Location: [facility] ENDOSCOPY;  Service: Endoscopy;  Laterality: N/A;   CORONARY ARTERY  BYPASS GRAFT  12/04/2005   x5 -- left internal mammary artery to the  LAD, left radial artery to the ramus intermedius, saphenous vein graft to the obtuse marginal 1, sequential saphenous vein grat to the acute marginal and posterior descending, endoscopic vein harvesting from the left thigh with open vein harvest from right leg   CORONARY STENT PLACEMENT  08/11/06   PCI of her ciurcumflex/OM vessel   ESOPHAGOGASTRODUODENOSCOPY (EGD) WITH PROPOFOL N/A 02/03/2019   Procedure: ESOPHAGOGASTRODUODENOSCOPY (EGD) WITH PROPOFOL;  Surgeon: Ladene Artist, MD;  Location: [facility] ENDOS[facility];  Service: Endoscopy;  Laterality: N/A;   FINE NEEDLE ASPIRATION  09/18/2021   Procedure: FINE NEEDLE ASPIRATION (FNA) LINEAR;  Surgeon: Collene Gobble, MD;  Location: [facility] ENDOSCOPY;  Service: Pulmonary;;   ICD IMPLANT N/A 07/15/2020   Procedure: ICD IMPLANT;  Surgeon: Vickie Epley, MD;  Location: [facility];  Service: Cardiovascular;  Laterality: N/A;   IR IMAGING GUIDED PORT INSERTION  10/20/2021   LAPAROTOMY Bilateral 05/19/2015   Procedure: EXPLORATORY LAPAROTOMY WITH BILATERAL SALPINGO OOPHORECTOMY /OMENTECTOMY/SEGMENTAL SIGMOID COLECTOMY ;  Surgeon: Everitt Amber, MD;  Location: [facility];  Service: Gynecology;  Laterality: Bilateral;   PERIPHERAL VASCULAR BALLOON ANGIOPLASTY  04/18/2020   Procedure: PERIPHERAL VASCULAR BALLOON ANGIOPLASTY;  Surgeon: Lorretta Harp, MD;  Location: [facility];  Service: Cardiovascular;;  rt SFA  atherectomy and DCB   PERIPHERAL VASCULAR CATHETERIZATION N/A 11/15/2015   Procedure: Carotid PTA/Stent Intervention;  Surgeon: Lorretta Harp, MD;  Location: [facility];  Service: Cardiovascular;  Laterality: N/A;   PERIPHERAL VASCULAR CATHETERIZATION  11/15/2015   Procedure: Carotid Angiography;  Surgeon: Lorretta Harp, MD;  Location: [facility];  Service: Cardiovascular;;   RIGHT HEART CATH N/A 01/27/2020   Procedure: RIGHT HEART CATH;  Surgeon: Larey Dresser, MD;  Location: [facility];  Service: Cardiovascular;   Laterality: N/A;   VIDEO BRONCHOSCOPY WITH ENDOBRONCHIAL ULTRASOUND N/A 09/18/2021   Procedure: VIDEO BRONCHOSCOPY WITH ENDOBRONCHIAL ULTRASOUND;  Surgeon: Collene Gobble, MD;  Location: [facility] ENDOSCOPY;  Service: Pulmonary;  Laterality: N/A;   VIDEO BRONCHOSCOPY WITH RADIAL ENDOBRONCHIAL ULTRASOUND  09/18/2021   Procedure: RADIAL ENDOBRONCHIAL ULTRASOUND;  Surgeon: Collene Gobble, MD;  Location: [facility] ENDOSCOPY;  Service: Pulmonary;;    REVIEW OF SYSTEMS:   Constitutional: Positive for baseline fatigue.  Positive for slightly diminished appetite.  Negative for chills, fever and unexpected weight change.  HENT: Negative for mouth sores, nosebleeds, sore throat and trouble swallowing.   Eyes: Negative for eye problems and icterus.  Respiratory: Positive for baseline dyspnea on exertion. Positive for cough. Negative for hemoptysis and wheezing.   Cardiovascular: Negative for chest pain and leg swelling.  Gastrointestinal: Positive for mild nausea. Negative for abdominal pain, constipation, diarrhea, and vomiting.  Genitourinary: Negative for bladder incontinence, difficulty urinating, dysuria, frequency and hematuria.   Musculoskeletal: Negative for back pain, gait problem, neck pain and neck stiffness.  Skin: Negative for itching and rash.  Neurological: Negative for dizziness, extremity weakness, gait problem, headaches, light-headedness and seizures.  Hematological: Negative for adenopathy. Does not bruise/bleed easily.  Psychiatric/Behavioral: Negative for confusion, depression and sleep disturbance. The patient is not nervous/anxious.     PHYSICAL EXAMINATION:  Blood pressure (!) 106/59, pulse 98, temperature (!) 97 F (36.1 C), temperature source Tympanic, resp. rate 16, weight 132 lb 8 oz (60.1 kg), last menstrual period 09/02/2003, SpO2 100 %.  ECOG PERFORMANCE STATUS: 1-2  Physical Exam  Constitutional: Oriented to person, place,  and time and chronically ill-appearing female and in no  distress. HENT:  Head: Normocephalic.  Several bruises noted on right jaw. Mouth/Throat: Oropharynx is clear and moist. No oropharyngeal exudate.  Eyes: Conjunctivae are normal. Right eye exhibits no discharge. Left eye exhibits no discharge. No scleral icterus.  Neck: Normal range of motion. Neck supple.  Cardiovascular: Normal rate, regular rhythm, systolic murmur noted and intact distal pulses.   Pulmonary/Chest: Effort normal and breath sounds normal. No respiratory distress. No wheezes. No rales.  Abdominal: Soft. Bowel sounds are normal. Exhibits no distension and no mass. There is no tenderness.  Musculoskeletal: Normal range of motion. Exhibits no edema.  Lymphadenopathy:    No cervical adenopathy.  Neurological: Alert and oriented to person, place, and time.  Muscle wasting.  Patient ambulates with a walker.   Skin: Skin is warm and dry. No rash noted. Not diaphoretic. No erythema. No pallor.  Bruising noted bilaterally on upper extremities. Psychiatric: Mood, memory and judgment normal.  Vitals reviewed.  LABORATORY DATA: Lab Results  Component Value Date   WBC 3.6 (L) 11/27/2021   HGB 10.4 (L) 11/27/2021   HCT 33.3 (L) 11/27/2021   MCV 86.3 11/27/2021   PLT 197 11/27/2021      Chemistry      Component Value Date/Time   NA 133 (L) 11/21/2021 1423   NA 135 04/14/2020 1544   NA 135 (L) 02/15/2017 1457   K 3.6 11/21/2021 1423   K 4.8 02/15/2017 1457   CL 94 (L) 11/21/2021 1423   CO2 31 11/21/2021 1423   CO2 26 02/15/2017 1457   BUN 18 11/21/2021 1423   BUN 17 04/14/2020 1544   BUN 10.8 02/15/2017 1457   CREATININE 0.82 11/21/2021 1423   CREATININE 0.69 05/06/2017 1405   CREATININE 0.7 02/15/2017 1457      Component Value Date/Time   CALCIUM 9.5 11/21/2021 1423   CALCIUM 9.3 02/15/2017 1457   ALKPHOS 77 11/21/2021 1423   ALKPHOS 75 02/15/2017 1457   AST 13 (L) 11/21/2021 1423   AST 9 02/15/2017 1457   ALT 15 11/21/2021 1423   ALT 13 02/15/2017 1457    BILITOT 0.4 11/21/2021 1423   BILITOT 0.28 02/15/2017 1457       RADIOGRAPHIC STUDIES:  No results found.   ASSESSMENT/PLAN:  This is a very pleasant 66 year old Caucasian female recently diagnosed with limited stage (T3, N2, M0) small cell lung cancer presented with disease in the right upper lobe with right hilar and mediastinal lymphadenopathy . She was diagnosed December 2022.   She also was previously established with our clinic for iron deficiency anemia.  She receives as needed IV iron.  Her last iron infusion was on 11/07/2021.    The patient is status post 1 cycle of carboplatin for an AUC of 5, etoposide 100 mg per metered square, and Imfinzi 1500 mg IV every 3 weeks.  Despite having multiple core morbidities, the patient tolerated treatment well without any concerning adverse side effects except for mild nausea without vomiting.  However, due to the patient having evidence of limited stage disease, Imfinzi was discontinued after cycle #1  The patient is now presently on carboplatin for an AUC of 5 on day 1 and Etoposide 100 mg/m2 on day s 1, 2, and 3 IV every 3 weeks.   Her ANC was 0.6 last week but has recovered to 2.2 today. I reviewed with Dr. Julien Nordmann. Her labs are acceptable to proceed with cycle #3 of chemotherapy  today as scheduled.   Discussed her [place] may drop in the interval until her next cycle of treatment in 3 weeks. We will be monitoring her labs closely every week. If low ANC and WBC, we may call her to arrange for GCSF support.   We will see her back for a follow up visit in 3 weeks before starting cycle #4.   The patient was advised to call immediately if she has any concerning symptoms in the interval. The patient voices understanding of current disease status and treatment options and is in agreement with the current care plan. All questions were answered. The patient knows to call the clinic with any problems, questions or concerns. We can certainly see the  patient much sooner if necessary      No orders of the defined types were placed in this encounter.    The total time spent in the appointment was 20-29 minutes.   Loie Jahr L Aristide Waggle, PA-C 11/27/21

## 2021-11-24 ENCOUNTER — Inpatient Hospital Stay: Payer: Medicare Other

## 2021-11-24 ENCOUNTER — Ambulatory Visit
Admission: RE | Admit: 2021-11-24 | Discharge: 2021-11-24 | Disposition: A | Discharge status: Home / Self Care | Payer: Medicare Other | Source: Ambulatory Visit | Attending: Radiation Oncology | Admitting: Radiation Oncology

## 2021-11-24 DIAGNOSIS — Z510 Encounter for antineoplastic radiation therapy: Secondary | ICD-10-CM | POA: Diagnosis not present

## 2021-11-24 DIAGNOSIS — Z5111 Encounter for antineoplastic chemotherapy: Secondary | ICD-10-CM | POA: Diagnosis not present

## 2021-11-24 DIAGNOSIS — C3411 Malignant neoplasm of upper lobe, right bronchus or lung: Secondary | ICD-10-CM | POA: Diagnosis not present

## 2021-11-24 DIAGNOSIS — D509 Iron deficiency anemia, unspecified: Secondary | ICD-10-CM | POA: Diagnosis not present

## 2021-11-27 ENCOUNTER — Ambulatory Visit
Admission: RE | Admit: 2021-11-27 | Discharge: 2021-11-27 | Disposition: A | Discharge status: Home / Self Care | Payer: Medicare Other | Source: Ambulatory Visit | Attending: Radiation Oncology | Admitting: Radiation Oncology

## 2021-11-27 ENCOUNTER — Inpatient Hospital Stay: Payer: Medicare Other

## 2021-11-27 ENCOUNTER — Inpatient Hospital Stay (HOSPITAL_BASED_OUTPATIENT_CLINIC_OR_DEPARTMENT_OTHER): Payer: Medicare Other | Admitting: Physician Assistant

## 2021-11-27 ENCOUNTER — Other Ambulatory Visit: Payer: Self-pay

## 2021-11-27 VITALS — BP 106/59 | HR 98 | Temp 97.0°F | Resp 16 | Wt 132.5 lb

## 2021-11-27 DIAGNOSIS — C3490 Malignant neoplasm of unspecified part of unspecified bronchus or lung: Secondary | ICD-10-CM

## 2021-11-27 DIAGNOSIS — Z5111 Encounter for antineoplastic chemotherapy: Secondary | ICD-10-CM | POA: Diagnosis not present

## 2021-11-27 DIAGNOSIS — C3411 Malignant neoplasm of upper lobe, right bronchus or lung: Secondary | ICD-10-CM | POA: Diagnosis not present

## 2021-11-27 DIAGNOSIS — Z510 Encounter for antineoplastic radiation therapy: Secondary | ICD-10-CM | POA: Diagnosis not present

## 2021-11-27 DIAGNOSIS — D509 Iron deficiency anemia, unspecified: Secondary | ICD-10-CM | POA: Diagnosis not present

## 2021-11-27 DIAGNOSIS — T451X5A Adverse effect of antineoplastic and immunosuppressive drugs, initial encounter: Secondary | ICD-10-CM

## 2021-11-27 DIAGNOSIS — Z95828 Presence of other vascular implants and grafts: Secondary | ICD-10-CM

## 2021-11-27 DIAGNOSIS — D6481 Anemia due to antineoplastic chemotherapy: Secondary | ICD-10-CM

## 2021-11-27 LAB — CMP (CANCER CENTER ONLY)
ALT: 13 U/L (ref 0–44)
AST: 11 U/L — ABNORMAL LOW (ref 15–41)
Albumin: 4.1 g/dL (ref 3.5–5.0)
Alkaline Phosphatase: 73 U/L (ref 38–126)
Anion gap: 6 (ref 5–15)
BUN: 18 mg/dL (ref 8–23)
CO2: 31 mmol/L (ref 22–32)
Calcium: 9.4 mg/dL (ref 8.9–10.3)
Chloride: 96 mmol/L — ABNORMAL LOW (ref 98–111)
Creatinine: 0.82 mg/dL (ref 0.44–1.00)
GFR, Estimated: >60 mL/min (ref >60)
Glucose, Bld: 216 mg/dL — ABNORMAL HIGH (ref 70–99)
Potassium: 3.6 mmol/L (ref 3.5–5.1)
Sodium: 133 mmol/L — ABNORMAL LOW (ref 135–145)
Total Bilirubin: 0.4 mg/dL (ref 0.3–1.2)
Total Protein: 6.9 g/dL (ref 6.5–8.1)

## 2021-11-27 LAB — CBC WITH DIFFERENTIAL (CANCER CENTER ONLY)
Abs Immature Granulocytes: 0.02 10*3/uL (ref 0.00–0.07)
Basophils Absolute: 0 10*3/uL (ref 0.0–0.1)
Basophils Relative: 1 %
Eosinophils Absolute: 0 10*3/uL (ref 0.0–0.5)
Eosinophils Relative: 1 %
HCT: 33.3 % — ABNORMAL LOW (ref 36.0–46.0)
Hemoglobin: 10.4 g/dL — ABNORMAL LOW (ref 12.0–15.0)
Immature Granulocytes: 1 %
Lymphocytes Relative: 23 %
Lymphs Abs: 0.8 10*3/uL (ref 0.7–4.0)
MCH: 26.9 pg (ref 26.0–34.0)
MCHC: 31.2 g/dL (ref 30.0–36.0)
MCV: 86.3 fL (ref 80.0–100.0)
Monocytes Absolute: 0.6 10*3/uL (ref 0.1–1.0)
Monocytes Relative: 16 %
Neutro Abs: 2.2 10*3/uL (ref 1.7–7.7)
Neutrophils Relative %: 58 %
Platelet Count: 197 10*3/uL (ref 150–400)
RBC: 3.86 MIL/uL — ABNORMAL LOW (ref 3.87–5.11)
RDW: 28.7 % — ABNORMAL HIGH (ref 11.5–15.5)
WBC Count: 3.6 10*3/uL — ABNORMAL LOW (ref 4.0–10.5)
nRBC: 0.6 % — ABNORMAL HIGH (ref 0.0–0.2)

## 2021-11-27 MED ORDER — SODIUM CHLORIDE 0.9% FLUSH
10.0000 mL | INTRAVENOUS | Status: DC | PRN
  Administered 2021-11-27: 10 mL

## 2021-11-27 MED ORDER — SODIUM CHLORIDE 0.9% FLUSH
10.0000 mL | INTRAVENOUS | Status: AC | PRN
  Administered 2021-11-27: 10 mL

## 2021-11-27 MED ORDER — SODIUM CHLORIDE 0.9 % IV SOLN
100.0000 mg/m2 | Freq: Once | INTRAVENOUS | Status: AC
  Administered 2021-11-27: 150 mg via INTRAVENOUS
  Filled 2021-11-27: qty 7.5

## 2021-11-27 MED ORDER — HEPARIN SOD (PORK) LOCK FLUSH 100 UNIT/ML IV SOLN
500.0000 [IU] | Freq: Once | INTRAVENOUS | Status: AC | PRN
  Administered 2021-11-27: 500 [IU]

## 2021-11-27 MED ORDER — SODIUM CHLORIDE 0.9 % IV SOLN
150.0000 mg | Freq: Once | INTRAVENOUS | Status: AC
  Administered 2021-11-27: 150 mg via INTRAVENOUS
  Filled 2021-11-27: qty 150

## 2021-11-27 MED ORDER — SODIUM CHLORIDE 0.9 % IV SOLN
380.0000 mg | Freq: Once | INTRAVENOUS | Status: AC
  Administered 2021-11-27: 380 mg via INTRAVENOUS
  Filled 2021-11-27: qty 38

## 2021-11-27 MED ORDER — SODIUM CHLORIDE 0.9 % IV SOLN: Freq: Once | INTRAVENOUS | Status: AC

## 2021-11-27 MED ORDER — SODIUM CHLORIDE 0.9 % IV SOLN
10.0000 mg | Freq: Once | INTRAVENOUS | Status: AC
  Administered 2021-11-27: 10 mg via INTRAVENOUS
  Filled 2021-11-27: qty 10

## 2021-11-27 MED ORDER — PALONOSETRON HCL INJECTION 0.25 MG/5ML
0.2500 mg | Freq: Once | INTRAVENOUS | Status: AC
  Administered 2021-11-27: 0.25 mg via INTRAVENOUS
  Filled 2021-11-27: qty 5

## 2021-11-27 NOTE — Progress Notes (Signed)
Pt requested to stay accessed due to having infusion today and the next few days. Home dressing and biopatch applied.

## 2021-11-27 NOTE — Patient Instructions (Signed)
[facility] ONCOLOGY  Discharge Instructions: Thank you for choosing [facility] to provide your oncology and hematology care.   If you have a lab appointment with the [facility], please go directly to the [facility] and check in at the registration area.   Wear comfortable clothing and clothing appropriate for easy access to any Portacath or PICC line.   We strive to give you quality time with your provider. You may need to reschedule your appointment if you arrive late (15 or more minutes).  Arriving late affects you and other patients whose appointments are after yours.  Also, if you miss three or more appointments without notifying the office, you may be dismissed from the clinic at the providers discretion.      For prescription refill requests, have your pharmacy contact our office and allow 72 hours for refills to be completed.    Today you received the following chemotherapy and/or immunotherapy agents: Carboplatin and etoposide    To help prevent nausea and vomiting after your treatment, we encourage you to take your nausea medication as directed.  BELOW ARE SYMPTOMS THAT SHOULD BE REPORTED IMMEDIATELY: *FEVER GREATER THAN 100.4 F (38 C) OR HIGHER *CHILLS OR SWEATING *NAUSEA AND VOMITING THAT IS NOT CONTROLLED WITH YOUR NAUSEA MEDICATION *UNUSUAL SHORTNESS OF BREATH *UNUSUAL BRUISING OR BLEEDING *URINARY PROBLEMS (pain or burning when urinating, or frequent urination) *BOWEL PROBLEMS (unusual diarrhea, constipation, pain near the anus) TENDERNESS IN MOUTH AND THROAT WITH OR WITHOUT PRESENCE OF ULCERS (sore throat, sores in mouth, or a toothache) UNUSUAL RASH, SWELLING OR PAIN  UNUSUAL VAGINAL DISCHARGE OR ITCHING   Items with * indicate a potential emergency and should be followed up as soon as possible or go to the Emergency Department if any problems should occur.  Please show the CHEMOTHERAPY ALERT CARD or IMMUNOTHERAPY ALERT CARD  at check-in to the Emergency Department and triage nurse.  Should you have questions after your visit or need to cancel or reschedule your appointment, please contact [facility]  Dept: [phone]  and follow the prompts.  Office hours are 8:00 a.m. to 4:30 p.m. Monday - Friday. Please note that voicemails left after 4:00 p.m. may not be returned until the following business day.  We are closed weekends and major holidays. You have access to a nurse at all times for urgent questions. Please call the main number to the clinic Dept: [phone] and follow the prompts.   For any non-urgent questions, you may also contact your provider using MyChart. We now offer e-Visits for anyone 68 and older to request care online for non-urgent symptoms. For details visit [email or web address].   Also download the MyChart app! Go to the app store, search "MyChart", open the app, select [facility], and log in with your MyChart username and password.  Due to Covid, a mask is required upon entering the hospital/clinic. If you do not have a mask, one will be given to you upon arrival. For doctor visits, patients may have 1 support person aged 68 or older with them. For treatment visits, patients cannot have anyone with them due to current Covid guidelines and our immunocompromised population.

## 2021-11-28 ENCOUNTER — Inpatient Hospital Stay: Payer: Medicare Other

## 2021-11-28 ENCOUNTER — Ambulatory Visit
Admission: RE | Admit: 2021-11-28 | Discharge: 2021-11-28 | Disposition: A | Discharge status: Home / Self Care | Payer: Medicare Other | Source: Ambulatory Visit | Attending: Radiation Oncology | Admitting: Radiation Oncology

## 2021-11-28 VITALS — BP 124/59 | HR 99 | Temp 97.6°F | Resp 17

## 2021-11-28 DIAGNOSIS — C3490 Malignant neoplasm of unspecified part of unspecified bronchus or lung: Secondary | ICD-10-CM

## 2021-11-28 DIAGNOSIS — J449 Chronic obstructive pulmonary disease, unspecified: Secondary | ICD-10-CM | POA: Diagnosis not present

## 2021-11-28 DIAGNOSIS — Z5111 Encounter for antineoplastic chemotherapy: Secondary | ICD-10-CM | POA: Diagnosis not present

## 2021-11-28 DIAGNOSIS — D509 Iron deficiency anemia, unspecified: Secondary | ICD-10-CM | POA: Diagnosis not present

## 2021-11-28 DIAGNOSIS — C3411 Malignant neoplasm of upper lobe, right bronchus or lung: Secondary | ICD-10-CM | POA: Diagnosis not present

## 2021-11-28 DIAGNOSIS — Z510 Encounter for antineoplastic radiation therapy: Secondary | ICD-10-CM | POA: Diagnosis not present

## 2021-11-28 DIAGNOSIS — E119 Type 2 diabetes mellitus without complications: Secondary | ICD-10-CM | POA: Diagnosis not present

## 2021-11-28 DIAGNOSIS — I5042 Chronic combined systolic (congestive) and diastolic (congestive) heart failure: Secondary | ICD-10-CM | POA: Diagnosis not present

## 2021-11-28 MED ORDER — SODIUM CHLORIDE 0.9% FLUSH
10.0000 mL | INTRAVENOUS | Status: DC | PRN
  Administered 2021-11-28: 10 mL

## 2021-11-28 MED ORDER — SODIUM CHLORIDE 0.9 % IV SOLN
10.0000 mg | Freq: Once | INTRAVENOUS | Status: DC
  Filled 2021-11-28: qty 1

## 2021-11-28 MED ORDER — HEPARIN SOD (PORK) LOCK FLUSH 100 UNIT/ML IV SOLN
500.0000 [IU] | Freq: Once | INTRAVENOUS | Status: AC | PRN
  Administered 2021-11-28: 500 [IU]

## 2021-11-28 MED ORDER — SODIUM CHLORIDE 0.9 % IV SOLN
10.0000 mg | Freq: Once | INTRAVENOUS | Status: AC
  Administered 2021-11-28: 10 mg via INTRAVENOUS
  Filled 2021-11-28: qty 10

## 2021-11-28 MED ORDER — SODIUM CHLORIDE 0.9 % IV SOLN: Freq: Once | INTRAVENOUS | Status: AC

## 2021-11-28 MED ORDER — SODIUM CHLORIDE 0.9 % IV SOLN
100.0000 mg/m2 | Freq: Once | INTRAVENOUS | Status: AC
  Administered 2021-11-28: 150 mg via INTRAVENOUS
  Filled 2021-11-28: qty 7.5

## 2021-11-28 NOTE — Patient Instructions (Signed)
[facility]  Discharge Instructions: Thank you for choosing [facility] to provide your oncology and hematology care.   If you have a lab appointment with the [facility], please go directly to the [facility] and check in at the registration area.   Wear comfortable clothing and clothing appropriate for easy access to any Portacath or PICC line.   We strive to give you quality time with your provider. You may need to reschedule your appointment if you arrive late (15 or more minutes).  Arriving late affects you and other patients whose appointments are after yours.  Also, if you miss three or more appointments without notifying the office, you may be dismissed from the clinic at the providers discretion.      For prescription refill requests, have your pharmacy contact our office and allow 72 hours for refills to be completed.    Today you received the following chemotherapy and/or immunotherapy agents: Etoposide    To help prevent nausea and vomiting after your treatment, we encourage you to take your nausea medication as directed.  BELOW ARE SYMPTOMS THAT SHOULD BE REPORTED IMMEDIATELY: *FEVER GREATER THAN 100.4 F (38 C) OR HIGHER *CHILLS OR SWEATING *NAUSEA AND VOMITING THAT IS NOT CONTROLLED WITH YOUR NAUSEA MEDICATION *UNUSUAL SHORTNESS OF BREATH *UNUSUAL BRUISING OR BLEEDING *URINARY PROBLEMS (pain or burning when urinating, or frequent urination) *BOWEL PROBLEMS (unusual diarrhea, constipation, pain near the anus) TENDERNESS IN MOUTH AND THROAT WITH OR WITHOUT PRESENCE OF ULCERS (sore throat, sores in mouth, or a toothache) UNUSUAL RASH, SWELLING OR PAIN  UNUSUAL VAGINAL DISCHARGE OR ITCHING   Items with * indicate a potential emergency and should be followed up as soon as possible or go to the Emergency Department if any problems should occur.  Please show the CHEMOTHERAPY ALERT CARD or IMMUNOTHERAPY ALERT CARD at check-in to  the Emergency Department and triage nurse.  Should you have questions after your visit or need to cancel or reschedule your appointment, please contact [facility]  Dept: [phone]  and follow the prompts.  Office hours are 8:00 a.m. to 4:30 p.m. Monday - Friday. Please note that voicemails left after 4:00 p.m. may not be returned until the following business day.  We are closed weekends and major holidays. You have access to a nurse at all times for urgent questions. Please call the main number to the clinic Dept: [phone] and follow the prompts.   For any non-urgent questions, you may also contact your provider using MyChart. We now offer e-Visits for anyone 47 and older to request care online for non-urgent symptoms. For details visit [email or web address].   Also download the MyChart app! Go to the app store, search "MyChart", open the app, select [facility], and log in with your MyChart username and password.  Due to Covid, a mask is required upon entering the hospital/clinic. If you do not have a mask, one will be given to you upon arrival. For doctor visits, patients may have 1 support Ziere Docken aged 68 or older with them. For treatment visits, patients cannot have anyone with them due to current Covid guidelines and our immunocompromised population.

## 2021-11-29 ENCOUNTER — Inpatient Hospital Stay: Payer: Medicare Other

## 2021-11-29 ENCOUNTER — Ambulatory Visit
Admission: RE | Admit: 2021-11-29 | Discharge: 2021-11-29 | Disposition: A | Discharge status: Home / Self Care | Payer: Medicare Other | Source: Ambulatory Visit | Attending: Radiation Oncology | Admitting: Radiation Oncology

## 2021-11-29 ENCOUNTER — Other Ambulatory Visit: Payer: Self-pay

## 2021-11-29 VITALS — BP 130/64 | HR 91 | Temp 97.9°F | Resp 18

## 2021-11-29 DIAGNOSIS — R1310 Dysphagia, unspecified: Secondary | ICD-10-CM | POA: Insufficient documentation

## 2021-11-29 DIAGNOSIS — C3411 Malignant neoplasm of upper lobe, right bronchus or lung: Secondary | ICD-10-CM | POA: Insufficient documentation

## 2021-11-29 DIAGNOSIS — D509 Iron deficiency anemia, unspecified: Secondary | ICD-10-CM | POA: Insufficient documentation

## 2021-11-29 DIAGNOSIS — R531 Weakness: Secondary | ICD-10-CM | POA: Insufficient documentation

## 2021-11-29 DIAGNOSIS — C3490 Malignant neoplasm of unspecified part of unspecified bronchus or lung: Secondary | ICD-10-CM

## 2021-11-29 DIAGNOSIS — C3491 Malignant neoplasm of unspecified part of right bronchus or lung: Secondary | ICD-10-CM | POA: Diagnosis not present

## 2021-11-29 DIAGNOSIS — Z5111 Encounter for antineoplastic chemotherapy: Secondary | ICD-10-CM | POA: Insufficient documentation

## 2021-11-29 DIAGNOSIS — Z95828 Presence of other vascular implants and grafts: Secondary | ICD-10-CM

## 2021-11-29 DIAGNOSIS — R112 Nausea with vomiting, unspecified: Secondary | ICD-10-CM | POA: Insufficient documentation

## 2021-11-29 MED ORDER — SODIUM CHLORIDE 0.9 % IV SOLN
100.0000 mg/m2 | Freq: Once | INTRAVENOUS | Status: AC
  Administered 2021-11-29: 150 mg via INTRAVENOUS
  Filled 2021-11-29: qty 7.5

## 2021-11-29 MED ORDER — SODIUM CHLORIDE 0.9 % IV SOLN: Freq: Once | INTRAVENOUS | Status: AC

## 2021-11-29 MED ORDER — SODIUM CHLORIDE 0.9 % IV SOLN
10.0000 mg | Freq: Once | INTRAVENOUS | Status: AC
  Administered 2021-11-29: 10 mg via INTRAVENOUS
  Filled 2021-11-29: qty 10
  Filled 2021-11-29: qty 1

## 2021-11-29 MED ORDER — HEPARIN SOD (PORK) LOCK FLUSH 100 UNIT/ML IV SOLN
500.0000 [IU] | Freq: Once | INTRAVENOUS | Status: AC | PRN
  Administered 2021-11-29: 500 [IU]

## 2021-11-29 MED ORDER — SODIUM CHLORIDE 0.9% FLUSH
10.0000 mL | INTRAVENOUS | Status: DC | PRN
  Administered 2021-11-29: 10 mL

## 2021-11-29 MED ORDER — SODIUM CHLORIDE 0.9% FLUSH
10.0000 mL | INTRAVENOUS | Status: DC | PRN
  Administered 2021-11-29: 10 mL via INTRAVENOUS

## 2021-11-29 NOTE — Patient Instructions (Signed)
[facility]  Discharge Instructions: ?Thank you for choosing [facility] to provide your oncology and hematology care.  ? ?If you have a lab appointment with the [facility], please go directly to the [facility] and check in at the registration area. ?  ?Wear comfortable clothing and clothing appropriate for easy access to any Portacath or PICC line.  ? ?We strive to give you quality time with your provider. You may need to reschedule your appointment if you arrive late (15 or more minutes).  Arriving late affects you and other patients whose appointments are after yours.  Also, if you miss three or more appointments without notifying the office, you may be dismissed from the clinic at the provider?s discretion.    ?  ?For prescription refill requests, have your pharmacy contact our office and allow 72 hours for refills to be completed.   ? ?Today you received the following chemotherapy and/or immunotherapy agents: Etoposide.     ?  ?To help prevent nausea and vomiting after your treatment, we encourage you to take your nausea medication as directed. ? ?BELOW ARE SYMPTOMS THAT SHOULD BE REPORTED IMMEDIATELY: ?*FEVER GREATER THAN 100.4 F (38 ?C) OR HIGHER ?*CHILLS OR SWEATING ?*NAUSEA AND VOMITING THAT IS NOT CONTROLLED WITH YOUR NAUSEA MEDICATION ?*UNUSUAL SHORTNESS OF BREATH ?*UNUSUAL BRUISING OR BLEEDING ?*URINARY PROBLEMS (pain or burning when urinating, or frequent urination) ?*BOWEL PROBLEMS (unusual diarrhea, constipation, pain near the anus) ?TENDERNESS IN MOUTH AND THROAT WITH OR WITHOUT PRESENCE OF ULCERS (sore throat, sores in mouth, or a toothache) ?UNUSUAL RASH, SWELLING OR PAIN  ?UNUSUAL VAGINAL DISCHARGE OR ITCHING  ? ?Items with * indicate a potential emergency and should be followed up as soon as possible or go to the Emergency Department if any problems should occur. ? ?Please show the CHEMOTHERAPY ALERT CARD or IMMUNOTHERAPY ALERT CARD at check-in  to the Emergency Department and triage nurse. ? ?Should you have questions after your visit or need to cancel or reschedule your appointment, please contact [facility]  Dept: [phone]  and follow the prompts.  Office hours are 8:00 a.m. to 4:30 p.m. Monday - Friday. Please note that voicemails left after 4:00 p.m. may not be returned until the following business day.  We are closed weekends and major holidays. You have access to a nurse at all times for urgent questions. Please call the main number to the clinic Dept: [phone] and follow the prompts. ? ? ?For any non-urgent questions, you may also contact your provider using MyChart. We now offer e-Visits for anyone 75 and older to request care online for non-urgent symptoms. For details visit [email or web address]. ?  ?Also download the MyChart app! Go to the app store, search "MyChart", open the app, select [facility], and log in with your MyChart username and password. ? ?Due to Covid, a mask is required upon entering the hospital/clinic. If you do not have a mask, one will be given to you upon arrival. For doctor visits, patients may have 1 support person aged 31 or older with them. For treatment visits, patients cannot have anyone with them due to current Covid guidelines and our immunocompromised population.  ? ?

## 2021-11-30 ENCOUNTER — Ambulatory Visit
Admission: RE | Admit: 2021-11-30 | Discharge: 2021-11-30 | Disposition: A | Discharge status: Home / Self Care | Payer: Medicare Other | Source: Ambulatory Visit | Attending: Radiation Oncology | Admitting: Radiation Oncology

## 2021-11-30 ENCOUNTER — Inpatient Hospital Stay: Payer: Medicare Other

## 2021-11-30 DIAGNOSIS — C3491 Malignant neoplasm of unspecified part of right bronchus or lung: Secondary | ICD-10-CM | POA: Diagnosis not present

## 2021-11-30 DIAGNOSIS — C3411 Malignant neoplasm of upper lobe, right bronchus or lung: Secondary | ICD-10-CM | POA: Diagnosis not present

## 2021-12-01 ENCOUNTER — Inpatient Hospital Stay: Payer: Medicare Other

## 2021-12-01 ENCOUNTER — Ambulatory Visit
Admission: RE | Admit: 2021-12-01 | Discharge: 2021-12-01 | Disposition: A | Discharge status: Home / Self Care | Payer: Medicare Other | Source: Ambulatory Visit | Attending: Radiation Oncology | Admitting: Radiation Oncology

## 2021-12-01 DIAGNOSIS — C3411 Malignant neoplasm of upper lobe, right bronchus or lung: Secondary | ICD-10-CM | POA: Diagnosis not present

## 2021-12-01 DIAGNOSIS — C3491 Malignant neoplasm of unspecified part of right bronchus or lung: Secondary | ICD-10-CM | POA: Diagnosis not present

## 2021-12-04 ENCOUNTER — Ambulatory Visit
Admission: RE | Admit: 2021-12-04 | Discharge: 2021-12-04 | Disposition: A | Discharge status: Home / Self Care | Payer: Medicare Other | Source: Ambulatory Visit | Attending: Radiation Oncology | Admitting: Radiation Oncology

## 2021-12-04 ENCOUNTER — Other Ambulatory Visit: Payer: Self-pay

## 2021-12-04 ENCOUNTER — Inpatient Hospital Stay: Payer: Medicare Other

## 2021-12-04 DIAGNOSIS — C3411 Malignant neoplasm of upper lobe, right bronchus or lung: Secondary | ICD-10-CM | POA: Diagnosis not present

## 2021-12-04 DIAGNOSIS — C3491 Malignant neoplasm of unspecified part of right bronchus or lung: Secondary | ICD-10-CM | POA: Diagnosis not present

## 2021-12-05 ENCOUNTER — Ambulatory Visit
Admission: RE | Admit: 2021-12-05 | Discharge: 2021-12-05 | Disposition: A | Discharge status: Home / Self Care | Payer: Medicare Other | Source: Ambulatory Visit | Attending: Radiation Oncology | Admitting: Radiation Oncology

## 2021-12-05 ENCOUNTER — Other Ambulatory Visit: Payer: Self-pay

## 2021-12-05 ENCOUNTER — Telehealth: Payer: Self-pay | Admitting: Physician Assistant

## 2021-12-05 ENCOUNTER — Inpatient Hospital Stay: Payer: Medicare Other

## 2021-12-05 ENCOUNTER — Other Ambulatory Visit: Payer: Self-pay | Admitting: Radiation Oncology

## 2021-12-05 DIAGNOSIS — C3491 Malignant neoplasm of unspecified part of right bronchus or lung: Secondary | ICD-10-CM | POA: Diagnosis not present

## 2021-12-05 DIAGNOSIS — D6481 Anemia due to antineoplastic chemotherapy: Secondary | ICD-10-CM

## 2021-12-05 DIAGNOSIS — Z95828 Presence of other vascular implants and grafts: Secondary | ICD-10-CM

## 2021-12-05 DIAGNOSIS — T451X5A Adverse effect of antineoplastic and immunosuppressive drugs, initial encounter: Secondary | ICD-10-CM

## 2021-12-05 DIAGNOSIS — C3411 Malignant neoplasm of upper lobe, right bronchus or lung: Secondary | ICD-10-CM | POA: Diagnosis not present

## 2021-12-05 LAB — CBC WITH DIFFERENTIAL (CANCER CENTER ONLY)
Abs Immature Granulocytes: 0.01 10*3/uL (ref 0.00–0.07)
Basophils Absolute: 0 10*3/uL (ref 0.0–0.1)
Basophils Relative: 1 %
Eosinophils Absolute: 0 10*3/uL (ref 0.0–0.5)
Eosinophils Relative: 1 %
HCT: 30.5 % — ABNORMAL LOW (ref 36.0–46.0)
Hemoglobin: 10.2 g/dL — ABNORMAL LOW (ref 12.0–15.0)
Immature Granulocytes: 0 %
Lymphocytes Relative: 17 %
Lymphs Abs: 0.4 10*3/uL — ABNORMAL LOW (ref 0.7–4.0)
MCH: 27.9 pg (ref 26.0–34.0)
MCHC: 33.4 g/dL (ref 30.0–36.0)
MCV: 83.6 fL (ref 80.0–100.0)
Monocytes Absolute: 0.1 10*3/uL (ref 0.1–1.0)
Monocytes Relative: 5 %
Neutro Abs: 2 10*3/uL (ref 1.7–7.7)
Neutrophils Relative %: 76 %
Platelet Count: 130 10*3/uL — ABNORMAL LOW (ref 150–400)
RBC: 3.65 MIL/uL — ABNORMAL LOW (ref 3.87–5.11)
RDW: 26.1 % — ABNORMAL HIGH (ref 11.5–15.5)
WBC Count: 2.6 10*3/uL — ABNORMAL LOW (ref 4.0–10.5)
nRBC: 0 % (ref 0.0–0.2)

## 2021-12-05 LAB — CMP (CANCER CENTER ONLY)
ALT: 15 U/L (ref 0–44)
AST: 15 U/L (ref 15–41)
Albumin: 4.3 g/dL (ref 3.5–5.0)
Alkaline Phosphatase: 70 U/L (ref 38–126)
Anion gap: 8 (ref 5–15)
BUN: 30 mg/dL — ABNORMAL HIGH (ref 8–23)
CO2: 30 mmol/L (ref 22–32)
Calcium: 9.6 mg/dL (ref 8.9–10.3)
Chloride: 94 mmol/L — ABNORMAL LOW (ref 98–111)
Creatinine: 0.94 mg/dL (ref 0.44–1.00)
GFR, Estimated: >60 mL/min (ref >60)
Glucose, Bld: 55 mg/dL — ABNORMAL LOW (ref 70–99)
Potassium: 3.3 mmol/L — ABNORMAL LOW (ref 3.5–5.1)
Sodium: 132 mmol/L — ABNORMAL LOW (ref 135–145)
Total Bilirubin: 0.4 mg/dL (ref 0.3–1.2)
Total Protein: 7.2 g/dL (ref 6.5–8.1)

## 2021-12-05 MED ORDER — SODIUM CHLORIDE 0.9% FLUSH
10.0000 mL | INTRAVENOUS | Status: AC | PRN
  Administered 2021-12-05: 10 mL

## 2021-12-05 MED ORDER — HEPARIN SOD (PORK) LOCK FLUSH 100 UNIT/ML IV SOLN
500.0000 [IU] | INTRAVENOUS | Status: AC | PRN
  Administered 2021-12-05: 500 [IU]

## 2021-12-05 MED ORDER — SUCRALFATE 1 G PO TABS: 1.0000 g | ORAL_TABLET | Freq: Three times a day (TID) | ORAL | 1 refills | Status: DC

## 2021-12-05 NOTE — Telephone Encounter (Signed)
Scheduled per 02/28 los, patient has been called and notified.  ?

## 2021-12-06 ENCOUNTER — Inpatient Hospital Stay: Payer: Medicare Other

## 2021-12-06 ENCOUNTER — Ambulatory Visit
Admission: RE | Admit: 2021-12-06 | Discharge: 2021-12-06 | Disposition: A | Discharge status: Home / Self Care | Payer: Medicare Other | Source: Ambulatory Visit | Attending: Radiation Oncology | Admitting: Radiation Oncology

## 2021-12-06 ENCOUNTER — Telehealth: Payer: Self-pay

## 2021-12-06 DIAGNOSIS — C3491 Malignant neoplasm of unspecified part of right bronchus or lung: Secondary | ICD-10-CM | POA: Diagnosis not present

## 2021-12-06 DIAGNOSIS — C3411 Malignant neoplasm of upper lobe, right bronchus or lung: Secondary | ICD-10-CM | POA: Diagnosis not present

## 2021-12-06 NOTE — Telephone Encounter (Signed)
-----   Message from [employer], PA-C sent at 12/06/2021  9:27 AM EST ----- ?Can you call her and just let her know her blood sugar was 55 yesterday and to please make sure she is ok and monitors her BS closely ?----- Message ----- ?From: Interface, [facility] In [facility] ?Sent: 12/05/2021   4:29 PM EST ?To: Cassandra L Heilingoetter, PA-C ? ? ?

## 2021-12-06 NOTE — Telephone Encounter (Signed)
I spoke with pt and she advised she is okay. Pt was surprised at the result and stated "my body usually lets me know when I get that low". I asked pt if she has a continuous monitor and she states she does but just needs to put it on. I have encouraged the pt to put the monitor on, especially give she didn't recognize her BS was low. Pt expressed understanding of this information and advised she will put her monitor on. ?

## 2021-12-07 ENCOUNTER — Inpatient Hospital Stay: Payer: Medicare Other

## 2021-12-07 ENCOUNTER — Other Ambulatory Visit: Payer: Self-pay

## 2021-12-07 ENCOUNTER — Ambulatory Visit
Admission: RE | Admit: 2021-12-07 | Discharge: 2021-12-07 | Disposition: A | Discharge status: Home / Self Care | Payer: Medicare Other | Source: Ambulatory Visit | Attending: Radiation Oncology | Admitting: Radiation Oncology

## 2021-12-07 DIAGNOSIS — C3491 Malignant neoplasm of unspecified part of right bronchus or lung: Secondary | ICD-10-CM | POA: Diagnosis not present

## 2021-12-07 DIAGNOSIS — C3411 Malignant neoplasm of upper lobe, right bronchus or lung: Secondary | ICD-10-CM | POA: Diagnosis not present

## 2021-12-08 ENCOUNTER — Ambulatory Visit
Admission: RE | Admit: 2021-12-08 | Discharge: 2021-12-08 | Disposition: A | Discharge status: Home / Self Care | Payer: Medicare Other | Source: Ambulatory Visit | Attending: Radiation Oncology | Admitting: Radiation Oncology

## 2021-12-08 ENCOUNTER — Inpatient Hospital Stay: Payer: Medicare Other

## 2021-12-08 DIAGNOSIS — C3411 Malignant neoplasm of upper lobe, right bronchus or lung: Secondary | ICD-10-CM | POA: Diagnosis not present

## 2021-12-08 DIAGNOSIS — C3491 Malignant neoplasm of unspecified part of right bronchus or lung: Secondary | ICD-10-CM | POA: Diagnosis not present

## 2021-12-11 ENCOUNTER — Inpatient Hospital Stay: Payer: Medicare Other

## 2021-12-11 ENCOUNTER — Other Ambulatory Visit: Payer: Self-pay | Admitting: Radiation Oncology

## 2021-12-11 ENCOUNTER — Other Ambulatory Visit: Payer: Self-pay

## 2021-12-11 ENCOUNTER — Ambulatory Visit
Admission: RE | Admit: 2021-12-11 | Discharge: 2021-12-11 | Disposition: A | Discharge status: Home / Self Care | Payer: Medicare Other | Source: Ambulatory Visit | Attending: Radiation Oncology | Admitting: Radiation Oncology

## 2021-12-11 DIAGNOSIS — C3411 Malignant neoplasm of upper lobe, right bronchus or lung: Secondary | ICD-10-CM | POA: Diagnosis not present

## 2021-12-11 DIAGNOSIS — C3491 Malignant neoplasm of unspecified part of right bronchus or lung: Secondary | ICD-10-CM | POA: Diagnosis not present

## 2021-12-11 MED ORDER — HYDROCODONE-ACETAMINOPHEN 5-325 MG PO TABS: 1.0000 | ORAL_TABLET | Freq: Four times a day (QID) | ORAL | 0 refills | Status: DC | PRN

## 2021-12-12 ENCOUNTER — Inpatient Hospital Stay: Payer: Medicare Other

## 2021-12-12 ENCOUNTER — Telehealth: Payer: Self-pay | Admitting: Physician Assistant

## 2021-12-12 ENCOUNTER — Telehealth: Payer: Self-pay | Admitting: Radiology

## 2021-12-12 ENCOUNTER — Ambulatory Visit
Admission: RE | Admit: 2021-12-12 | Discharge: 2021-12-12 | Disposition: A | Discharge status: Home / Self Care | Payer: Medicare Other | Source: Ambulatory Visit | Attending: Radiation Oncology | Admitting: Radiation Oncology

## 2021-12-12 DIAGNOSIS — C3411 Malignant neoplasm of upper lobe, right bronchus or lung: Secondary | ICD-10-CM | POA: Diagnosis not present

## 2021-12-12 DIAGNOSIS — T451X5A Adverse effect of antineoplastic and immunosuppressive drugs, initial encounter: Secondary | ICD-10-CM

## 2021-12-12 DIAGNOSIS — C3491 Malignant neoplasm of unspecified part of right bronchus or lung: Secondary | ICD-10-CM | POA: Diagnosis not present

## 2021-12-12 DIAGNOSIS — Z95828 Presence of other vascular implants and grafts: Secondary | ICD-10-CM

## 2021-12-12 LAB — CBC WITH DIFFERENTIAL (CANCER CENTER ONLY)
Abs Immature Granulocytes: 0.02 10*3/uL (ref 0.00–0.07)
Basophils Absolute: 0 10*3/uL (ref 0.0–0.1)
Basophils Relative: 1 %
Eosinophils Absolute: 0 10*3/uL (ref 0.0–0.5)
Eosinophils Relative: 1 %
HCT: 30.7 % — ABNORMAL LOW (ref 36.0–46.0)
Hemoglobin: 10.5 g/dL — ABNORMAL LOW (ref 12.0–15.0)
Immature Granulocytes: 1 %
Lymphocytes Relative: 27 %
Lymphs Abs: 0.4 10*3/uL — ABNORMAL LOW (ref 0.7–4.0)
MCH: 28.5 pg (ref 26.0–34.0)
MCHC: 34.2 g/dL (ref 30.0–36.0)
MCV: 83.4 fL (ref 80.0–100.0)
Monocytes Absolute: 0.4 10*3/uL (ref 0.1–1.0)
Monocytes Relative: 25 %
Neutro Abs: 0.7 10*3/uL — ABNORMAL LOW (ref 1.7–7.7)
Neutrophils Relative %: 45 %
Platelet Count: 113 10*3/uL — ABNORMAL LOW (ref 150–400)
RBC: 3.68 MIL/uL — ABNORMAL LOW (ref 3.87–5.11)
RDW: 27.6 % — ABNORMAL HIGH (ref 11.5–15.5)
WBC Count: 1.6 10*3/uL — ABNORMAL LOW (ref 4.0–10.5)
nRBC: 0 % (ref 0.0–0.2)

## 2021-12-12 LAB — CMP (CANCER CENTER ONLY)
ALT: 12 U/L (ref 0–44)
AST: 9 U/L — ABNORMAL LOW (ref 15–41)
Albumin: 4.3 g/dL (ref 3.5–5.0)
Alkaline Phosphatase: 81 U/L (ref 38–126)
Anion gap: 10 (ref 5–15)
BUN: 25 mg/dL — ABNORMAL HIGH (ref 8–23)
CO2: 31 mmol/L (ref 22–32)
Calcium: 9.8 mg/dL (ref 8.9–10.3)
Chloride: 85 mmol/L — ABNORMAL LOW (ref 98–111)
Creatinine: 1.05 mg/dL — ABNORMAL HIGH (ref 0.44–1.00)
GFR, Estimated: 58 mL/min — ABNORMAL LOW (ref >60)
Glucose, Bld: 388 mg/dL — ABNORMAL HIGH (ref 70–99)
Potassium: 3.4 mmol/L — ABNORMAL LOW (ref 3.5–5.1)
Sodium: 126 mmol/L — ABNORMAL LOW (ref 135–145)
Total Bilirubin: 0.6 mg/dL (ref 0.3–1.2)
Total Protein: 7.1 g/dL (ref 6.5–8.1)

## 2021-12-12 MED ORDER — HEPARIN SOD (PORK) LOCK FLUSH 100 UNIT/ML IV SOLN
500.0000 [IU] | INTRAVENOUS | Status: AC | PRN
  Administered 2021-12-12: 500 [IU]

## 2021-12-12 MED ORDER — SODIUM CHLORIDE 0.9% FLUSH
10.0000 mL | INTRAVENOUS | Status: AC | PRN
  Administered 2021-12-12: 10 mL

## 2021-12-12 NOTE — Telephone Encounter (Signed)
Patient states she discussed a script for a medication to numb her throat being sent to the pharmacy that was not carafate. Can this be sent to [facility] on [address]? ?

## 2021-12-12 NOTE — Telephone Encounter (Signed)
Unable to edit note. In summary: The patient was advised to monitor for infection. She also was advised to monitor her blood sugar closer.  ?

## 2021-12-12 NOTE — Telephone Encounter (Signed)
The patient's blood sugar is elevated today at 388. She did not take her insulin today. She denies polyuria, polydipsia, abdominal pain, or other usual symptoms. She just reports fatigue. Her ANC is also low. Reviewed neutropenic precautions. She denies fevers, sore throat, nasal congestion, URI, skin infections, dysuria, etc. Advised to monitor for infection.  ?

## 2021-12-12 NOTE — Telephone Encounter (Signed)
Notified patient, per Dr Sondra Come, to use carafate regularly along with the hydrocodone. If this doesn't relieve her pain she should call back. Patient verbalized understanding. ?

## 2021-12-13 ENCOUNTER — Inpatient Hospital Stay: Payer: Medicare Other

## 2021-12-13 ENCOUNTER — Other Ambulatory Visit: Payer: Self-pay | Admitting: *Deleted

## 2021-12-13 ENCOUNTER — Other Ambulatory Visit: Payer: Self-pay | Admitting: Physician Assistant

## 2021-12-13 ENCOUNTER — Ambulatory Visit
Admission: RE | Admit: 2021-12-13 | Discharge: 2021-12-13 | Disposition: A | Discharge status: Home / Self Care | Payer: Medicare Other | Source: Ambulatory Visit | Attending: Radiation Oncology | Admitting: Radiation Oncology

## 2021-12-13 ENCOUNTER — Other Ambulatory Visit: Payer: Self-pay

## 2021-12-13 DIAGNOSIS — C3411 Malignant neoplasm of upper lobe, right bronchus or lung: Secondary | ICD-10-CM | POA: Diagnosis not present

## 2021-12-13 DIAGNOSIS — R531 Weakness: Secondary | ICD-10-CM

## 2021-12-13 DIAGNOSIS — C3491 Malignant neoplasm of unspecified part of right bronchus or lung: Secondary | ICD-10-CM | POA: Diagnosis not present

## 2021-12-13 MED ORDER — SODIUM CHLORIDE 0.9 % IV SOLN: Freq: Once | INTRAVENOUS | Status: AC

## 2021-12-13 NOTE — Progress Notes (Signed)
I talked to Dr. Julien Nordmann about the patient's labs. She is reporting weakness. We will bring her in after her radiation appointment for IVF. I let her know her NaCL was low. She denies swelling. She denied polyuria or dipsia. She also notes she fell and hurt her ankle last night. Denies redness or swelling. Will talk to Dr. Julien Nordmann about ordering an x ray.  ?

## 2021-12-13 NOTE — Patient Instructions (Signed)

## 2021-12-14 ENCOUNTER — Inpatient Hospital Stay: Payer: Medicare Other

## 2021-12-14 ENCOUNTER — Other Ambulatory Visit: Payer: Self-pay

## 2021-12-14 ENCOUNTER — Ambulatory Visit
Admission: RE | Admit: 2021-12-14 | Discharge: 2021-12-14 | Disposition: A | Discharge status: Home / Self Care | Payer: Medicare Other | Source: Ambulatory Visit | Attending: Radiation Oncology | Admitting: Radiation Oncology

## 2021-12-14 DIAGNOSIS — C3411 Malignant neoplasm of upper lobe, right bronchus or lung: Secondary | ICD-10-CM | POA: Diagnosis not present

## 2021-12-14 DIAGNOSIS — S82892A Other fracture of left lower leg, initial encounter for closed fracture: Secondary | ICD-10-CM | POA: Diagnosis not present

## 2021-12-14 DIAGNOSIS — M79672 Pain in left foot: Secondary | ICD-10-CM | POA: Diagnosis not present

## 2021-12-14 DIAGNOSIS — M25572 Pain in left ankle and joints of left foot: Secondary | ICD-10-CM | POA: Diagnosis not present

## 2021-12-14 DIAGNOSIS — C3491 Malignant neoplasm of unspecified part of right bronchus or lung: Secondary | ICD-10-CM | POA: Diagnosis not present

## 2021-12-15 ENCOUNTER — Inpatient Hospital Stay: Payer: Medicare Other

## 2021-12-15 ENCOUNTER — Ambulatory Visit
Admission: RE | Admit: 2021-12-15 | Discharge: 2021-12-15 | Disposition: A | Discharge status: Home / Self Care | Payer: Medicare Other | Source: Ambulatory Visit | Attending: Radiation Oncology | Admitting: Radiation Oncology

## 2021-12-15 DIAGNOSIS — C3491 Malignant neoplasm of unspecified part of right bronchus or lung: Secondary | ICD-10-CM | POA: Diagnosis not present

## 2021-12-15 DIAGNOSIS — C3411 Malignant neoplasm of upper lobe, right bronchus or lung: Secondary | ICD-10-CM | POA: Diagnosis not present

## 2021-12-16 NOTE — Progress Notes (Deleted)
[name], Diane Fanny, MD ?[place] ?[place] [address] ? ?DIAGNOSIS:  ?1) Limited stage (T3, N2, M0) small cell lung cancer presented with right hilar and mediastinal lymphadenopathy. There was no hypermetabolism in the left adrenal gland lesion. Diagnosed in December 2022. ?2)  Iron Deficiency Anemia ? ?PRIOR THERAPY: ?1) Systemic chemotherapy with systemic chemotherapy with carboplatin for an AUC of 5, etoposide 100 mg per metered squared, and Imfinzi 1500 mg IV every 3 weeks with Cosela for myeloprotection.  First dose on 10/16/2021.  Discontinued after 1 cycle as the patient staging PET scan showed limited stage disease. ? ?CURRENT THERAPY: ?1) systemic chemotherapy with carboplatin for an AUC of 5 on day 1 and etoposide 100 mg per metered squared on days 1, 2, and 3 IV every 3 weeks.  First dose on 11/06/2021. Status post 2 cycles.  ?2) Venofer infusions as needed. Last dose on 11/07/21  ? ?INTERVAL HISTORY: ?Diane [name] 66 y.o. female returns to the clinic today for a follow-up visit.  The patient is status post 2 cycles of treatment.  She tolerated the first cycle of treatment well but experienced more fatigue and weakness with cycle #2.  She also has had neutropenia with cycle #1 and 2 which has recovered without G-CSF injections.  She denies any signs and symptoms of infection including fever, chills, night sweats, dental pain, ear infections, sinus infections, skin infections, dysuria, abdominal pain, or diarrhea.  She reports her baseline dyspnea on exertion and chronic cough.  She has not needed to take anything for cough. ? ?Last week, the patient fell and hurt her left ankle.  She went to an orthopedic urgent care who diagnosed her with ***.  ? ?Besides the fatigue and weakness, the patient has some nausea with treatment which is cold with controlled with her antiemetic.  She uses Phenergan as an antiemetic which is more effective for her.  She  denies any chills, night sweats.  She denies any diarrhea or constipation.  Denies any chest pain or hemoptysis.  Denies any headache or visual changes.  Except for her baseline visual changes secondary to her cataracts.  She is here today for evaluation and repeat blood work before starting cycle #4. ? ? ? ?MEDICAL HISTORY: ?Past Medical History:  ?Diagnosis Date  ? Anemia   ? Arthritis   ? Carotid stenosis   ? a. s/p right carotid stent 10/2017.  ? Chronic systolic CHF (congestive heart failure) ([facility])   ? Colon polyps   ? COPD (chronic obstructive pulmonary disease) ([facility])   ? Coronary artery disease   ? post CABG in 3/07 , coronary stents   ? Depression   ? after major stroke years ago, no longer treated  ? Diabetes mellitus   ? type 2  ? Dyslipidemia   ? Fatty liver   ? Fibromyalgia   ? GERD (gastroesophageal reflux disease)   ? Headache   ? hx of   ? History of kidney stones   ? Hyperlipidemia   ? Hypertension   ? Myocardial infarction [facility])   ? Pneumonia   ? hx of   ? Seizures ([facility])   ? last seizure- 03/2013   ? Shortness of breath dyspnea   ? with exertion or when fluid builds up   ? Sleep apnea   ? used to wear a cpap- not used in 3 years   ? Status post dilation of esophageal narrowing   ?  Stroke [facility]) 1993  ? problems with balance   ? Systolic murmur   ? known mild AS and MR  ? Thyroid goiter   ? Tobacco abuse   ? ? ?ALLERGIES:  is allergic to bee venom, benadryl [diphenhydramine], entresto [sacubitril-valsartan], farxiga [dapagliflozin], tetracycline, canagliflozin, sitagliptin, and ace inhibitors. ? ?MEDICATIONS:  ?Current Outpatient Medications  ?Medication Sig Dispense Refill  ? acetaminophen (TYLENOL) 500 MG tablet Take 1,000 mg by mouth at bedtime.    ? albuterol (PROVENTIL) (2.5 MG/3ML) 0.083% nebulizer solution Take 3 mLs (2.5 mg total) by nebulization every 6 (six) hours as needed for wheezing or shortness of breath. 1180 mL 3  ? albuterol (VENTOLIN HFA) 108 (90 Base) MCG/ACT inhaler Inhale 2 puffs  into the lungs every 6 (six) hours as needed for wheezing or shortness of breath. 24 g 3  ? aspirin EC 81 MG tablet Take 81 mg by mouth every evening.  30 tablet 6  ? BD PEN NEEDLE NANO 2ND GEN 32G X 4 MM MISC Inject into the skin 3 (three) times daily.    ? clopidogrel (PLAVIX) 75 MG tablet Take 1 tablet (75 mg total) by mouth daily. 90 tablet 3  ? dexlansoprazole (DEXILANT) 60 MG capsule Take 1 capsule (60 mg total) by mouth every evening.    ? digoxin (LANOXIN) 0.125 MG tablet Take 0.5 tablets (0.0625 mg total) by mouth daily. 45 tablet 3  ? diphenhydrAMINE (BENADRYL) 25 mg capsule Take 25 mg by mouth 2 (two) times daily as needed for itching or allergies.    ? ezetimibe (ZETIA) 10 MG tablet Take 1 tablet (10 mg total) by mouth daily. 90 tablet 3  ? fluticasone (FLONASE) 50 MCG/ACT nasal spray Place 2 sprays into both nostrils daily. (Patient taking differently: Place 2 sprays into both nostrils daily as needed for allergies.) 48 g 3  ? gabapentin (NEURONTIN) 300 MG capsule TAKE 2 CAPSULES DAILY AT   BEDTIME 180 capsule 1  ? glucose blood (ONETOUCH ULTRA) test strip 3 (three) times daily.    ? guaiFENesin (MUCINEX) 600 MG 12 hr tablet Take 2 tablets (1,200 mg total) by mouth 2 (two) times daily. 360 tablet 3  ? HYDROcodone-acetaminophen (NORCO) 5-325 MG tablet Take 1 tablet by mouth every 6 (six) hours as needed for moderate pain. 30 tablet 0  ? icosapent Ethyl (VASCEPA) 1 g capsule Take 2 capsules (2 g total) by mouth 2 (two) times daily. 360 capsule 3  ? insulin aspart protamine- aspart (NOVOLOG MIX 70/30) (70-30) 100 UNIT/ML injection Inject 5-65 Units into the skin See admin instructions. 65 units in the Am with breakfast and 5 units with lunch and 55 units with dinner    ? levETIRAcetam (KEPPRA) 1000 MG tablet Take 1 tablet (1,000 mg total) by mouth 2 (two) times daily.    ? lidocaine-prilocaine (EMLA) cream Apply to the Port-A-Cath site 30-60 minutes before chemotherapy (Patient not taking: Reported on  11/01/2021) 30 g 0  ? metFORMIN (GLUCOPHAGE-XR) 500 MG 24 hr tablet Take 500 mg by mouth every evening.   6  ? metolazone (ZAROXOLYN) 2.5 MG tablet Take 1 tablet (2.5 mg total) by mouth 2 (two) times a week. Every Mon and Fri 30 tablet 3  ? nicotine (NICODERM CQ - DOSED IN MG/24 HOURS) 21 mg/24hr patch Place 21 mg onto the skin daily as needed (to cease smoking).    ? nicotine polacrilex (NICORETTE) 4 MG gum Take 4 mg by mouth daily as needed for smoking cessation.    ?  nitroGLYCERIN (NITROLINGUAL) 0.4 MG/SPRAY spray Place 1 spray under the tongue every 5 (five) minutes x 3 doses as needed for chest pain. 12 g 3  ? nystatin (MYCOSTATIN/NYSTOP) powder APPLY 1 APPLICATION        TOPICALLY 4 TIMES A DAY AS NEEDED FOR YEAST INFECTIONS 60 g 5  ? ONE TOUCH ULTRA TEST test strip USE AS DIRECTED FOR TESTING BLOOD GLUCOSE 3 TIMES DAILY  1  ? polyethylene glycol (MIRALAX / GLYCOLAX) 17 g packet Take 17 g by mouth daily.    ? potassium chloride SA (KLOR-CON M20) 20 MEQ tablet Take 2 tablets (40 mEq total) by mouth every morning AND 1 tablet (20 mEq total) every evening. 270 tablet 3  ? prochlorperazine (COMPAZINE) 10 MG tablet Take 1 tablet (10 mg total) by mouth every 6 (six) hours as needed for nausea or vomiting. 30 tablet 0  ? promethazine (PHENERGAN) 12.5 MG tablet Take 1 tablet (12.5 mg total) by mouth every 6 (six) hours as needed for nausea or vomiting. 30 tablet 2  ? ranolazine (RANEXA) 1000 MG SR tablet Take 1 tablet (1,000 mg total) by mouth 2 (two) times daily. 180 tablet 3  ? rosuvastatin (CRESTOR) 20 MG tablet Take 1 tablet (20 mg total) by mouth at bedtime.    ? sodium chloride (OCEAN) 0.65 % SOLN nasal spray Place 1 spray into both nostrils as needed for congestion.    ? spironolactone (ALDACTONE) 25 MG tablet Take 1 tablet (25 mg total) by mouth at bedtime. TAKE 1 TABLET BY MOUTH EVERYDAY AT BEDTIME    ? sucralfate (CARAFATE) 1 g tablet Take 1 tablet (1 g total) by mouth 4 (four) times daily -  with meals and at  bedtime. Crush and dissolve in 10 mL of warm water prior to swallowing 120 tablet 1  ? torsemide (DEMADEX) 20 MG tablet Take 3 tablets (60 mg total) by mouth 2 (two) times daily. 540 tablet 3  ? triamcin

## 2021-12-18 ENCOUNTER — Inpatient Hospital Stay: Payer: Medicare Other

## 2021-12-18 ENCOUNTER — Ambulatory Visit: Payer: Medicare Other

## 2021-12-18 ENCOUNTER — Inpatient Hospital Stay: Payer: Medicare Other | Admitting: Physician Assistant

## 2021-12-18 ENCOUNTER — Ambulatory Visit
Admission: RE | Admit: 2021-12-18 | Discharge: 2021-12-18 | Disposition: A | Discharge status: Home / Self Care | Payer: Medicare Other | Source: Ambulatory Visit | Attending: Radiation Oncology | Admitting: Radiation Oncology

## 2021-12-18 ENCOUNTER — Other Ambulatory Visit: Payer: Medicare Other

## 2021-12-18 ENCOUNTER — Other Ambulatory Visit: Payer: Self-pay | Admitting: Radiation Oncology

## 2021-12-18 ENCOUNTER — Ambulatory Visit: Payer: Medicare Other | Admitting: Internal Medicine

## 2021-12-18 DIAGNOSIS — C3491 Malignant neoplasm of unspecified part of right bronchus or lung: Secondary | ICD-10-CM | POA: Diagnosis not present

## 2021-12-18 DIAGNOSIS — C3411 Malignant neoplasm of upper lobe, right bronchus or lung: Secondary | ICD-10-CM | POA: Diagnosis not present

## 2021-12-18 MED ORDER — HYDROCODONE-ACETAMINOPHEN 5-325 MG PO TABS: 1.0000 | ORAL_TABLET | Freq: Four times a day (QID) | ORAL | 0 refills | Status: DC | PRN

## 2021-12-18 NOTE — Progress Notes (Signed)
[facility] ?OFFICE PROGRESS NOTE ? ?Tower, [name], MD ?[place] ?[place] [address] ? ?DIAGNOSIS: 1) Limited stage (T3, N2, M0) small cell lung cancer presented with right hilar and mediastinal lymphadenopathy. There was no hypermetabolism in the left adrenal gland lesion. Diagnosed in December 2022. ?2)  Iron Deficiency Anemia ? ?PRIOR THERAPY: ?1) Systemic chemotherapy with systemic chemotherapy with carboplatin for an AUC of 5, etoposide 100 mg per metered squared, and Imfinzi 1500 mg IV every 3 weeks with Cosela for myeloprotection.  First dose on 10/16/2021.  Discontinued after 1 cycle as the patient staging PET scan showed limited stage disease. ? ?CURRENT THERAPY: ?1) systemic chemotherapy with carboplatin for an AUC of 5 on day 1 and etoposide 100 mg per metered squared on days 1, 2, and 3 IV every 3 weeks.  First dose on 11/06/2021. Status post 2 cycles.  ?2) Venofer infusions as needed. Last dose on 11/07/21  ? ?INTERVAL HISTORY: ?Diane [name] 66 y.o. female  returns to the clinic today for a follow-up visit.  The patient is status post 2 cycles of treatment.  She tolerated the few cycles of treatment well despite multiple comorbidities but she has experienced more fatigue and weakness with her most recent cycle of treatment. She has not been able to eat and drink as much over the last 2 weeks due to progressive radiation esophagitis. She received extra IVF last week and felt somewhat better. She lost weight but unable to quantify due to not standing on the scale today. She states she weighted 124.3 lbs on her scale at home. She does not drink any supplemental drinks such as glycena. She is not interested in seeing the nutritionist. She has diabetes of note. She has fallen a few times this week due to her legs "giving out"/buckling. She fell last week and fractured her fibula. [facility] is [facility]. She has a boot on her left foot. She has a rollating walker. She  lives alone but has a sister who lives behind her and a nephew who is next door. She fell again last night. Denis injuries or hitting her head. She is on plavix and aspirin for a history of major CVA in the 1990's. She follows with Dr. Tomi Likens.  ? ? She also has had neutropenia with cycle #1, 2, and 3 which has recovered without G-CSF injections.  She denies any signs and symptoms of infection including fever, chills, night sweats, dental pain, ear infections, sinus infections, skin infections, dysuria, abdominal pain, or diarrhea.  She reports her baseline dyspnea on exertion and chronic cough.  She has not needed to take anything for cough. She denies changes in her breathing.  ? ? ?The patient has some nausea with treatment which is controlled with controlled with her antiemetic.  She uses Phenergan as an antiemetic which is more effective for her.   She denies any diarrhea or constipation.  Denies any chest pain or hemoptysis.  Denies any headache or visual changes except for her baseline visual changes secondary to her cataracts.  She is here today for evaluation and repeat blood work before starting cycle #4 which would be her final treatment. ? ?MEDICAL HISTORY: ?Past Medical History:  ?Diagnosis Date  ? Anemia   ? Arthritis   ? Carotid stenosis   ? a. s/p right carotid stent 10/2017.  ? Chronic systolic CHF (congestive heart failure) ([facility])   ? Colon polyps   ? COPD (chronic  obstructive pulmonary disease) ([facility])   ? Coronary artery disease   ? post CABG in 3/07 , coronary stents   ? Depression   ? after major stroke years ago, no longer treated  ? Diabetes mellitus   ? type 2  ? Dyslipidemia   ? Fatty liver   ? Fibromyalgia   ? GERD (gastroesophageal reflux disease)   ? Headache   ? hx of   ? History of kidney stones   ? Hyperlipidemia   ? Hypertension   ? Myocardial infarction [facility])   ? Pneumonia   ? hx of   ? Seizures ([facility])   ? last seizure- 03/2013   ? Shortness of breath dyspnea   ? with exertion or when fluid  builds up   ? Sleep apnea   ? used to wear a cpap- not used in 3 years   ? Status post dilation of esophageal narrowing   ? Stroke [facility]) 1993  ? problems with balance   ? Systolic murmur   ? known mild AS and MR  ? Thyroid goiter   ? Tobacco abuse   ? ? ?ALLERGIES:  is allergic to bee venom, benadryl [diphenhydramine], entresto [sacubitril-valsartan], farxiga [dapagliflozin], tetracycline, canagliflozin, sitagliptin, and ace inhibitors. ? ?MEDICATIONS:  ?Current Outpatient Medications  ?Medication Sig Dispense Refill  ? acetaminophen (TYLENOL) 500 MG tablet Take 1,000 mg by mouth at bedtime.    ? albuterol (PROVENTIL) (2.5 MG/3ML) 0.083% nebulizer solution Take 3 mLs (2.5 mg total) by nebulization every 6 (six) hours as needed for wheezing or shortness of breath. 1180 mL 3  ? albuterol (VENTOLIN HFA) 108 (90 Base) MCG/ACT inhaler Inhale 2 puffs into the lungs every 6 (six) hours as needed for wheezing or shortness of breath. 24 g 3  ? aspirin EC 81 MG tablet Take 81 mg by mouth every evening.  30 tablet 6  ? BD PEN NEEDLE NANO 2ND GEN 32G X 4 MM MISC Inject into the skin 3 (three) times daily.    ? clopidogrel (PLAVIX) 75 MG tablet Take 1 tablet (75 mg total) by mouth daily. 90 tablet 3  ? dexlansoprazole (DEXILANT) 60 MG capsule Take 1 capsule (60 mg total) by mouth every evening.    ? digoxin (LANOXIN) 0.125 MG tablet Take 0.5 tablets (0.0625 mg total) by mouth daily. 45 tablet 3  ? diphenhydrAMINE (BENADRYL) 25 mg capsule Take 25 mg by mouth 2 (two) times daily as needed for itching or allergies.    ? ezetimibe (ZETIA) 10 MG tablet Take 1 tablet (10 mg total) by mouth daily. 90 tablet 3  ? fluticasone (FLONASE) 50 MCG/ACT nasal spray Place 2 sprays into both nostrils daily. (Patient taking differently: Place 2 sprays into both nostrils daily as needed for allergies.) 48 g 3  ? gabapentin (NEURONTIN) 300 MG capsule TAKE 2 CAPSULES DAILY AT   BEDTIME 180 capsule 1  ? glucose blood (ONETOUCH ULTRA) test strip 3  (three) times daily.    ? guaiFENesin (MUCINEX) 600 MG 12 hr tablet Take 2 tablets (1,200 mg total) by mouth 2 (two) times daily. 360 tablet 3  ? HYDROcodone-acetaminophen (NORCO) 5-325 MG tablet Take 1 tablet by mouth every 6 (six) hours as needed for moderate pain. 30 tablet 0  ? icosapent Ethyl (VASCEPA) 1 g capsule Take 2 capsules (2 g total) by mouth 2 (two) times daily. 360 capsule 3  ? insulin aspart protamine- aspart (NOVOLOG MIX 70/30) (70-30) 100 UNIT/ML injection Inject 5-65 Units into the skin  See admin instructions. 65 units in the Am with breakfast and 5 units with lunch and 55 units with dinner    ? levETIRAcetam (KEPPRA) 1000 MG tablet Take 1 tablet (1,000 mg total) by mouth 2 (two) times daily.    ? lidocaine-prilocaine (EMLA) cream Apply to the Port-A-Cath site 30-60 minutes before chemotherapy (Patient not taking: Reported on 11/01/2021) 30 g 0  ? metFORMIN (GLUCOPHAGE-XR) 500 MG 24 hr tablet Take 500 mg by mouth every evening.   6  ? metolazone (ZAROXOLYN) 2.5 MG tablet Take 1 tablet (2.5 mg total) by mouth 2 (two) times a week. Every Mon and Fri 30 tablet 3  ? nicotine (NICODERM CQ - DOSED IN MG/24 HOURS) 21 mg/24hr patch Place 21 mg onto the skin daily as needed (to cease smoking).    ? nicotine polacrilex (NICORETTE) 4 MG gum Take 4 mg by mouth daily as needed for smoking cessation.    ? nitroGLYCERIN (NITROLINGUAL) 0.4 MG/SPRAY spray Place 1 spray under the tongue every 5 (five) minutes x 3 doses as needed for chest pain. 12 g 3  ? nystatin (MYCOSTATIN/NYSTOP) powder APPLY 1 APPLICATION        TOPICALLY 4 TIMES A DAY AS NEEDED FOR YEAST INFECTIONS 60 g 5  ? ONE TOUCH ULTRA TEST test strip USE AS DIRECTED FOR TESTING BLOOD GLUCOSE 3 TIMES DAILY  1  ? polyethylene glycol (MIRALAX / GLYCOLAX) 17 g packet Take 17 g by mouth daily.    ? potassium chloride SA (KLOR-CON M20) 20 MEQ tablet Take 2 tablets (40 mEq total) by mouth every morning AND 1 tablet (20 mEq total) every evening. 270 tablet 3  ?  prochlorperazine (COMPAZINE) 10 MG tablet Take 1 tablet (10 mg total) by mouth every 6 (six) hours as needed for nausea or vomiting. 30 tablet 0  ? promethazine (PHENERGAN) 12.5 MG tablet Take 1 tablet (12.5

## 2021-12-19 ENCOUNTER — Inpatient Hospital Stay: Payer: Medicare Other

## 2021-12-19 ENCOUNTER — Other Ambulatory Visit: Payer: Self-pay | Admitting: Internal Medicine

## 2021-12-19 ENCOUNTER — Ambulatory Visit
Admission: RE | Admit: 2021-12-19 | Discharge: 2021-12-19 | Disposition: A | Discharge status: Home / Self Care | Payer: Medicare Other | Source: Ambulatory Visit | Attending: Radiation Oncology | Admitting: Radiation Oncology

## 2021-12-19 ENCOUNTER — Other Ambulatory Visit: Payer: Self-pay

## 2021-12-19 ENCOUNTER — Inpatient Hospital Stay (HOSPITAL_BASED_OUTPATIENT_CLINIC_OR_DEPARTMENT_OTHER): Payer: Medicare Other | Admitting: Physician Assistant

## 2021-12-19 VITALS — Ht <= 58 in | Wt 125.5 lb

## 2021-12-19 VITALS — BP 103/61 | HR 96 | Temp 98.2°F | Resp 18 | Ht <= 58 in

## 2021-12-19 DIAGNOSIS — E860 Dehydration: Secondary | ICD-10-CM

## 2021-12-19 DIAGNOSIS — C3491 Malignant neoplasm of unspecified part of right bronchus or lung: Secondary | ICD-10-CM

## 2021-12-19 DIAGNOSIS — C3490 Malignant neoplasm of unspecified part of unspecified bronchus or lung: Secondary | ICD-10-CM

## 2021-12-19 DIAGNOSIS — K208 Other esophagitis without bleeding: Secondary | ICD-10-CM | POA: Diagnosis not present

## 2021-12-19 DIAGNOSIS — Z5111 Encounter for antineoplastic chemotherapy: Secondary | ICD-10-CM

## 2021-12-19 DIAGNOSIS — T66XXXA Radiation sickness, unspecified, initial encounter: Secondary | ICD-10-CM

## 2021-12-19 DIAGNOSIS — Z95828 Presence of other vascular implants and grafts: Secondary | ICD-10-CM

## 2021-12-19 DIAGNOSIS — T451X5A Adverse effect of antineoplastic and immunosuppressive drugs, initial encounter: Secondary | ICD-10-CM

## 2021-12-19 DIAGNOSIS — C3411 Malignant neoplasm of upper lobe, right bronchus or lung: Secondary | ICD-10-CM | POA: Diagnosis not present

## 2021-12-19 LAB — CBC WITH DIFFERENTIAL (CANCER CENTER ONLY)
Abs Immature Granulocytes: 0.02 10*3/uL (ref 0.00–0.07)
Basophils Absolute: 0 10*3/uL (ref 0.0–0.1)
Basophils Relative: 0 %
Eosinophils Absolute: 0 10*3/uL (ref 0.0–0.5)
Eosinophils Relative: 0 %
HCT: 29.4 % — ABNORMAL LOW (ref 36.0–46.0)
Hemoglobin: 9.8 g/dL — ABNORMAL LOW (ref 12.0–15.0)
Immature Granulocytes: 0 %
Lymphocytes Relative: 7 %
Lymphs Abs: 0.4 10*3/uL — ABNORMAL LOW (ref 0.7–4.0)
MCH: 29.6 pg (ref 26.0–34.0)
MCHC: 33.3 g/dL (ref 30.0–36.0)
MCV: 88.8 fL (ref 80.0–100.0)
Monocytes Absolute: 0.6 10*3/uL (ref 0.1–1.0)
Monocytes Relative: 13 %
Neutro Abs: 3.9 10*3/uL (ref 1.7–7.7)
Neutrophils Relative %: 80 %
Platelet Count: 170 10*3/uL (ref 150–400)
RBC: 3.31 MIL/uL — ABNORMAL LOW (ref 3.87–5.11)
RDW: 28.9 % — ABNORMAL HIGH (ref 11.5–15.5)
WBC Count: 4.9 10*3/uL (ref 4.0–10.5)
nRBC: 0 % (ref 0.0–0.2)

## 2021-12-19 LAB — CMP (CANCER CENTER ONLY)
ALT: 11 U/L (ref 0–44)
AST: 12 U/L — ABNORMAL LOW (ref 15–41)
Albumin: 4 g/dL (ref 3.5–5.0)
Alkaline Phosphatase: 67 U/L (ref 38–126)
Anion gap: 7 (ref 5–15)
BUN: 22 mg/dL (ref 8–23)
CO2: 32 mmol/L (ref 22–32)
Calcium: 9.3 mg/dL (ref 8.9–10.3)
Chloride: 91 mmol/L — ABNORMAL LOW (ref 98–111)
Creatinine: 0.95 mg/dL (ref 0.44–1.00)
GFR, Estimated: >60 mL/min (ref >60)
Glucose, Bld: 227 mg/dL — ABNORMAL HIGH (ref 70–99)
Potassium: 3.6 mmol/L (ref 3.5–5.1)
Sodium: 130 mmol/L — ABNORMAL LOW (ref 135–145)
Total Bilirubin: 0.5 mg/dL (ref 0.3–1.2)
Total Protein: 6.8 g/dL (ref 6.5–8.1)

## 2021-12-19 MED ORDER — SODIUM CHLORIDE 0.9 % IV SOLN: Freq: Once | INTRAVENOUS | Status: AC

## 2021-12-19 MED ORDER — SODIUM CHLORIDE 0.9% FLUSH
10.0000 mL | INTRAVENOUS | Status: DC | PRN
  Administered 2021-12-19: 10 mL via INTRAVENOUS

## 2021-12-19 MED ORDER — HEPARIN SOD (PORK) LOCK FLUSH 100 UNIT/ML IV SOLN
500.0000 [IU] | Freq: Once | INTRAVENOUS | Status: AC | PRN
  Administered 2021-12-19: 500 [IU]

## 2021-12-19 MED ORDER — SODIUM CHLORIDE 0.9% FLUSH
10.0000 mL | INTRAVENOUS | Status: DC | PRN
  Administered 2021-12-19: 10 mL

## 2021-12-19 MED ORDER — SODIUM CHLORIDE 0.9 % IV SOLN
100.0000 mg/m2 | Freq: Once | INTRAVENOUS | Status: AC
  Administered 2021-12-19: 150 mg via INTRAVENOUS
  Filled 2021-12-19: qty 7.5

## 2021-12-19 MED ORDER — SODIUM CHLORIDE 0.9 % IV SOLN
150.0000 mg | Freq: Once | INTRAVENOUS | Status: AC
  Administered 2021-12-19: 150 mg via INTRAVENOUS
  Filled 2021-12-19: qty 150

## 2021-12-19 MED ORDER — SODIUM CHLORIDE 0.9 % IV SOLN
380.0000 mg | Freq: Once | INTRAVENOUS | Status: AC
  Administered 2021-12-19: 380 mg via INTRAVENOUS
  Filled 2021-12-19: qty 38

## 2021-12-19 MED ORDER — SODIUM CHLORIDE 0.9 % IV SOLN
10.0000 mg | Freq: Once | INTRAVENOUS | Status: AC
  Administered 2021-12-19: 10 mg via INTRAVENOUS
  Filled 2021-12-19: qty 10

## 2021-12-19 MED ORDER — PALONOSETRON HCL INJECTION 0.25 MG/5ML
0.2500 mg | Freq: Once | INTRAVENOUS | Status: AC
  Administered 2021-12-19: 0.25 mg via INTRAVENOUS
  Filled 2021-12-19: qty 5

## 2021-12-19 NOTE — Patient Instructions (Signed)
[facility]  Discharge Instructions: ?Thank you for choosing [facility] to provide your oncology and hematology care.  ? ?If you have a lab appointment with the [facility], please go directly to the [facility] and check in at the registration area. ?  ?Wear comfortable clothing and clothing appropriate for easy access to any Portacath or PICC line.  ? ?We strive to give you quality time with your provider. You may need to reschedule your appointment if you arrive late (15 or more minutes).  Arriving late affects you and other patients whose appointments are after yours.  Also, if you miss three or more appointments without notifying the office, you may be dismissed from the clinic at the provider?s discretion.    ?  ?For prescription refill requests, have your pharmacy contact our office and allow 72 hours for refills to be completed.   ? ?Today you received the following chemotherapy and/or immunotherapy agents carboplatin, etoposide    ?  ?To help prevent nausea and vomiting after your treatment, we encourage you to take your nausea medication as directed. ? ?BELOW ARE SYMPTOMS THAT SHOULD BE REPORTED IMMEDIATELY: ?*FEVER GREATER THAN 100.4 F (38 ?C) OR HIGHER ?*CHILLS OR SWEATING ?*NAUSEA AND VOMITING THAT IS NOT CONTROLLED WITH YOUR NAUSEA MEDICATION ?*UNUSUAL SHORTNESS OF BREATH ?*UNUSUAL BRUISING OR BLEEDING ?*URINARY PROBLEMS (pain or burning when urinating, or frequent urination) ?*BOWEL PROBLEMS (unusual diarrhea, constipation, pain near the anus) ?TENDERNESS IN MOUTH AND THROAT WITH OR WITHOUT PRESENCE OF ULCERS (sore throat, sores in mouth, or a toothache) ?UNUSUAL RASH, SWELLING OR PAIN  ?UNUSUAL VAGINAL DISCHARGE OR ITCHING  ? ?Items with * indicate a potential emergency and should be followed up as soon as possible or go to the Emergency Department if any problems should occur. ? ?Please show the CHEMOTHERAPY ALERT CARD or IMMUNOTHERAPY ALERT CARD at  check-in to the Emergency Department and triage nurse. ? ?Should you have questions after your visit or need to cancel or reschedule your appointment, please contact [facility]  Dept: [phone]  and follow the prompts.  Office hours are 8:00 a.m. to 4:30 p.m. Monday - Friday. Please note that voicemails left after 4:00 p.m. may not be returned until the following business day.  We are closed weekends and major holidays. You have access to a nurse at all times for urgent questions. Please call the main number to the clinic Dept: [phone] and follow the prompts. ? ? ?For any non-urgent questions, you may also contact your provider using MyChart. We now offer e-Visits for anyone 42 and older to request care online for non-urgent symptoms. For details visit [email or web address]. ?  ?Also download the MyChart app! Go to the app store, search "MyChart", open the app, select [facility], and log in with your MyChart username and password. ? ?Due to Covid, a mask is required upon entering the hospital/clinic. If you do not have a mask, one will be given to you upon arrival. For doctor visits, patients may have 1 support person aged 10 or older with them. For treatment visits, patients cannot have anyone with them due to current Covid guidelines and our immunocompromised population.  ? ?

## 2021-12-20 ENCOUNTER — Inpatient Hospital Stay: Payer: Medicare Other

## 2021-12-20 ENCOUNTER — Ambulatory Visit
Admission: RE | Admit: 2021-12-20 | Discharge: 2021-12-20 | Disposition: A | Discharge status: Home / Self Care | Payer: Medicare Other | Source: Ambulatory Visit | Attending: Radiation Oncology | Admitting: Radiation Oncology

## 2021-12-20 VITALS — BP 106/62 | HR 97 | Temp 97.9°F | Resp 18

## 2021-12-20 DIAGNOSIS — C3490 Malignant neoplasm of unspecified part of unspecified bronchus or lung: Secondary | ICD-10-CM

## 2021-12-20 DIAGNOSIS — C3491 Malignant neoplasm of unspecified part of right bronchus or lung: Secondary | ICD-10-CM | POA: Diagnosis not present

## 2021-12-20 DIAGNOSIS — C3411 Malignant neoplasm of upper lobe, right bronchus or lung: Secondary | ICD-10-CM | POA: Diagnosis not present

## 2021-12-20 MED ORDER — SODIUM CHLORIDE 0.9 % IV SOLN
10.0000 mg | Freq: Once | INTRAVENOUS | Status: AC
  Administered 2021-12-20: 10 mg via INTRAVENOUS
  Filled 2021-12-20: qty 10

## 2021-12-20 MED ORDER — SODIUM CHLORIDE 0.9% FLUSH: 10.0000 mL | INTRAVENOUS | Status: DC | PRN

## 2021-12-20 MED ORDER — SODIUM CHLORIDE 0.9 % IV SOLN: Freq: Once | INTRAVENOUS | Status: AC

## 2021-12-20 MED ORDER — HEPARIN SOD (PORK) LOCK FLUSH 100 UNIT/ML IV SOLN: 500.0000 [IU] | Freq: Once | INTRAVENOUS | Status: DC | PRN

## 2021-12-20 MED ORDER — SODIUM CHLORIDE 0.9 % IV SOLN
100.0000 mg/m2 | Freq: Once | INTRAVENOUS | Status: AC
  Administered 2021-12-20: 150 mg via INTRAVENOUS
  Filled 2021-12-20: qty 7.5

## 2021-12-20 NOTE — Patient Instructions (Signed)
[facility]  Discharge Instructions: ?Thank you for choosing [facility] to provide your oncology and hematology care.  ? ?If you have a lab appointment with the [facility], please go directly to the [facility] and check in at the registration area. ?  ?Wear comfortable clothing and clothing appropriate for easy access to any Portacath or PICC line.  ? ?We strive to give you quality time with your provider. You may need to reschedule your appointment if you arrive late (15 or more minutes).  Arriving late affects you and other patients whose appointments are after yours.  Also, if you miss three or more appointments without notifying the office, you may be dismissed from the clinic at the provider?s discretion.    ?  ?For prescription refill requests, have your pharmacy contact our office and allow 72 hours for refills to be completed.   ? ?Today you received the following chemotherapy and/or immunotherapy agents: Etoposide.     ?  ?To help prevent nausea and vomiting after your treatment, we encourage you to take your nausea medication as directed. ? ?BELOW ARE SYMPTOMS THAT SHOULD BE REPORTED IMMEDIATELY: ?*FEVER GREATER THAN 100.4 F (38 ?C) OR HIGHER ?*CHILLS OR SWEATING ?*NAUSEA AND VOMITING THAT IS NOT CONTROLLED WITH YOUR NAUSEA MEDICATION ?*UNUSUAL SHORTNESS OF BREATH ?*UNUSUAL BRUISING OR BLEEDING ?*URINARY PROBLEMS (pain or burning when urinating, or frequent urination) ?*BOWEL PROBLEMS (unusual diarrhea, constipation, pain near the anus) ?TENDERNESS IN MOUTH AND THROAT WITH OR WITHOUT PRESENCE OF ULCERS (sore throat, sores in mouth, or a toothache) ?UNUSUAL RASH, SWELLING OR PAIN  ?UNUSUAL VAGINAL DISCHARGE OR ITCHING  ? ?Items with * indicate a potential emergency and should be followed up as soon as possible or go to the Emergency Department if any problems should occur. ? ?Please show the CHEMOTHERAPY ALERT CARD or IMMUNOTHERAPY ALERT CARD at check-in  to the Emergency Department and triage nurse. ? ?Should you have questions after your visit or need to cancel or reschedule your appointment, please contact [facility]  Dept: [phone]  and follow the prompts.  Office hours are 8:00 a.m. to 4:30 p.m. Monday - Friday. Please note that voicemails left after 4:00 p.m. may not be returned until the following business day.  We are closed weekends and major holidays. You have access to a nurse at all times for urgent questions. Please call the main number to the clinic Dept: [phone] and follow the prompts. ? ? ?For any non-urgent questions, you may also contact your provider using MyChart. We now offer e-Visits for anyone 84 and older to request care online for non-urgent symptoms. For details visit [email or web address]. ?  ?Also download the MyChart app! Go to the app store, search "MyChart", open the app, select [facility], and log in with your MyChart username and password. ? ?Due to Covid, a mask is required upon entering the hospital/clinic. If you do not have a mask, one will be given to you upon arrival. For doctor visits, patients may have 1 support person aged 28 or older with them. For treatment visits, patients cannot have anyone with them due to current Covid guidelines and our immunocompromised population.  ? ?

## 2021-12-21 ENCOUNTER — Inpatient Hospital Stay: Payer: Medicare Other

## 2021-12-21 ENCOUNTER — Other Ambulatory Visit: Payer: Self-pay

## 2021-12-21 ENCOUNTER — Ambulatory Visit
Admission: RE | Admit: 2021-12-21 | Discharge: 2021-12-21 | Disposition: A | Discharge status: Home / Self Care | Payer: Medicare Other | Source: Ambulatory Visit | Attending: Radiation Oncology | Admitting: Radiation Oncology

## 2021-12-21 VITALS — BP 120/64 | HR 89 | Temp 97.6°F | Resp 18

## 2021-12-21 DIAGNOSIS — C3491 Malignant neoplasm of unspecified part of right bronchus or lung: Secondary | ICD-10-CM | POA: Diagnosis not present

## 2021-12-21 DIAGNOSIS — C3411 Malignant neoplasm of upper lobe, right bronchus or lung: Secondary | ICD-10-CM | POA: Diagnosis not present

## 2021-12-21 DIAGNOSIS — C3490 Malignant neoplasm of unspecified part of unspecified bronchus or lung: Secondary | ICD-10-CM

## 2021-12-21 MED ORDER — SODIUM CHLORIDE 0.9% FLUSH
10.0000 mL | INTRAVENOUS | Status: DC | PRN
  Administered 2021-12-21: 10 mL

## 2021-12-21 MED ORDER — SODIUM CHLORIDE 0.9 % IV SOLN: Freq: Once | INTRAVENOUS | Status: AC

## 2021-12-21 MED ORDER — HEPARIN SOD (PORK) LOCK FLUSH 100 UNIT/ML IV SOLN
500.0000 [IU] | Freq: Once | INTRAVENOUS | Status: AC | PRN
  Administered 2021-12-21: 500 [IU]

## 2021-12-21 MED ORDER — SODIUM CHLORIDE 0.9 % IV SOLN
10.0000 mg | Freq: Once | INTRAVENOUS | Status: AC
  Administered 2021-12-21: 10 mg via INTRAVENOUS
  Filled 2021-12-21: qty 10

## 2021-12-21 MED ORDER — SODIUM CHLORIDE 0.9 % IV SOLN
100.0000 mg/m2 | Freq: Once | INTRAVENOUS | Status: AC
  Administered 2021-12-21: 150 mg via INTRAVENOUS
  Filled 2021-12-21: qty 7.5

## 2021-12-21 NOTE — Patient Instructions (Signed)
[facility]  Discharge Instructions: ?Thank you for choosing [facility] to provide your oncology and hematology care.  ? ?If you have a lab appointment with the [facility], please go directly to the [facility] and check in at the registration area. ?  ?Wear comfortable clothing and clothing appropriate for easy access to any Portacath or PICC line.  ? ?We strive to give you quality time with your provider. You may need to reschedule your appointment if you arrive late (15 or more minutes).  Arriving late affects you and other patients whose appointments are after yours.  Also, if you miss three or more appointments without notifying the office, you may be dismissed from the clinic at the provider?s discretion.    ?  ?For prescription refill requests, have your pharmacy contact our office and allow 72 hours for refills to be completed.   ? ?Today you received the following chemotherapy and/or immunotherapy agents Etoposide    ?  ?To help prevent nausea and vomiting after your treatment, we encourage you to take your nausea medication as directed. ? ?BELOW ARE SYMPTOMS THAT SHOULD BE REPORTED IMMEDIATELY: ?*FEVER GREATER THAN 100.4 F (38 ?C) OR HIGHER ?*CHILLS OR SWEATING ?*NAUSEA AND VOMITING THAT IS NOT CONTROLLED WITH YOUR NAUSEA MEDICATION ?*UNUSUAL SHORTNESS OF BREATH ?*UNUSUAL BRUISING OR BLEEDING ?*URINARY PROBLEMS (pain or burning when urinating, or frequent urination) ?*BOWEL PROBLEMS (unusual diarrhea, constipation, pain near the anus) ?TENDERNESS IN MOUTH AND THROAT WITH OR WITHOUT PRESENCE OF ULCERS (sore throat, sores in mouth, or a toothache) ?UNUSUAL RASH, SWELLING OR PAIN  ?UNUSUAL VAGINAL DISCHARGE OR ITCHING  ? ?Items with * indicate a potential emergency and should be followed up as soon as possible or go to the Emergency Department if any problems should occur. ? ?Please show the CHEMOTHERAPY ALERT CARD or IMMUNOTHERAPY ALERT CARD at check-in to  the Emergency Department and triage nurse. ? ?Should you have questions after your visit or need to cancel or reschedule your appointment, please contact [facility]  Dept: [phone]  and follow the prompts.  Office hours are 8:00 a.m. to 4:30 p.m. Monday - Friday. Please note that voicemails left after 4:00 p.m. may not be returned until the following business day.  We are closed weekends and major holidays. You have access to a nurse at all times for urgent questions. Please call the main number to the clinic Dept: [phone] and follow the prompts. ? ? ?For any non-urgent questions, you may also contact your provider using MyChart. We now offer e-Visits for anyone 87 and older to request care online for non-urgent symptoms. For details visit [email or web address]. ?  ?Also download the MyChart app! Go to the app store, search "MyChart", open the app, select [facility], and log in with your MyChart username and password. ? ?Due to Covid, a mask is required upon entering the hospital/clinic. If you do not have a mask, one will be given to you upon arrival. For doctor visits, patients may have 1 support person aged 57 or older with them. For treatment visits, patients cannot have anyone with them due to current Covid guidelines and our immunocompromised population.  ? ?

## 2021-12-22 ENCOUNTER — Encounter: Payer: Medicare Other | Admitting: Physician Assistant

## 2021-12-22 ENCOUNTER — Other Ambulatory Visit: Payer: Self-pay

## 2021-12-22 ENCOUNTER — Inpatient Hospital Stay: Payer: Medicare Other

## 2021-12-22 ENCOUNTER — Ambulatory Visit
Admission: RE | Admit: 2021-12-22 | Discharge: 2021-12-22 | Disposition: A | Discharge status: Home / Self Care | Payer: Medicare Other | Source: Ambulatory Visit | Attending: Radiation Oncology | Admitting: Radiation Oncology

## 2021-12-22 ENCOUNTER — Encounter (HOSPITAL_COMMUNITY): Payer: Medicare Other | Admitting: Cardiology

## 2021-12-22 DIAGNOSIS — C3411 Malignant neoplasm of upper lobe, right bronchus or lung: Secondary | ICD-10-CM | POA: Diagnosis not present

## 2021-12-22 DIAGNOSIS — C3491 Malignant neoplasm of unspecified part of right bronchus or lung: Secondary | ICD-10-CM | POA: Diagnosis not present

## 2021-12-25 ENCOUNTER — Ambulatory Visit
Admission: RE | Admit: 2021-12-25 | Discharge: 2021-12-25 | Disposition: A | Discharge status: Home / Self Care | Payer: Medicare Other | Source: Ambulatory Visit | Attending: Radiation Oncology | Admitting: Radiation Oncology

## 2021-12-25 ENCOUNTER — Inpatient Hospital Stay: Payer: Medicare Other

## 2021-12-25 ENCOUNTER — Other Ambulatory Visit: Payer: Self-pay

## 2021-12-25 DIAGNOSIS — C3491 Malignant neoplasm of unspecified part of right bronchus or lung: Secondary | ICD-10-CM | POA: Diagnosis not present

## 2021-12-25 DIAGNOSIS — C3411 Malignant neoplasm of upper lobe, right bronchus or lung: Secondary | ICD-10-CM | POA: Diagnosis not present

## 2021-12-26 ENCOUNTER — Other Ambulatory Visit: Payer: Self-pay | Admitting: Radiology

## 2021-12-26 ENCOUNTER — Ambulatory Visit
Admission: RE | Admit: 2021-12-26 | Payer: Medicare Other | Source: Ambulatory Visit | Attending: Radiation Oncology | Admitting: Radiation Oncology

## 2021-12-26 ENCOUNTER — Ambulatory Visit
Admission: RE | Admit: 2021-12-26 | Discharge: 2021-12-26 | Disposition: A | Discharge status: Home / Self Care | Payer: Medicare Other | Source: Ambulatory Visit | Attending: Radiation Oncology | Admitting: Radiation Oncology

## 2021-12-26 ENCOUNTER — Other Ambulatory Visit: Payer: Self-pay | Admitting: Physician Assistant

## 2021-12-26 ENCOUNTER — Encounter: Payer: Self-pay | Admitting: Internal Medicine

## 2021-12-26 ENCOUNTER — Inpatient Hospital Stay: Payer: Medicare Other

## 2021-12-26 ENCOUNTER — Other Ambulatory Visit: Payer: Self-pay

## 2021-12-26 ENCOUNTER — Other Ambulatory Visit (HOSPITAL_COMMUNITY): Payer: Self-pay

## 2021-12-26 ENCOUNTER — Encounter: Payer: Self-pay | Admitting: Radiation Oncology

## 2021-12-26 ENCOUNTER — Other Ambulatory Visit: Payer: Medicare Other

## 2021-12-26 ENCOUNTER — Encounter: Payer: Self-pay | Admitting: Physician Assistant

## 2021-12-26 ENCOUNTER — Telehealth: Payer: Self-pay | Admitting: Physician Assistant

## 2021-12-26 DIAGNOSIS — C3491 Malignant neoplasm of unspecified part of right bronchus or lung: Secondary | ICD-10-CM | POA: Diagnosis not present

## 2021-12-26 DIAGNOSIS — Z95828 Presence of other vascular implants and grafts: Secondary | ICD-10-CM

## 2021-12-26 DIAGNOSIS — D6481 Anemia due to antineoplastic chemotherapy: Secondary | ICD-10-CM

## 2021-12-26 DIAGNOSIS — C3411 Malignant neoplasm of upper lobe, right bronchus or lung: Secondary | ICD-10-CM | POA: Diagnosis not present

## 2021-12-26 DIAGNOSIS — E876 Hypokalemia: Secondary | ICD-10-CM

## 2021-12-26 DIAGNOSIS — I959 Hypotension, unspecified: Secondary | ICD-10-CM

## 2021-12-26 LAB — CBC WITH DIFFERENTIAL (CANCER CENTER ONLY)
Abs Immature Granulocytes: 0.03 10*3/uL (ref 0.00–0.07)
Basophils Absolute: 0 10*3/uL (ref 0.0–0.1)
Basophils Relative: 1 %
Eosinophils Absolute: 0 10*3/uL (ref 0.0–0.5)
Eosinophils Relative: 1 %
HCT: 27.3 % — ABNORMAL LOW (ref 36.0–46.0)
Hemoglobin: 9.4 g/dL — ABNORMAL LOW (ref 12.0–15.0)
Immature Granulocytes: 1 %
Lymphocytes Relative: 13 %
Lymphs Abs: 0.3 10*3/uL — ABNORMAL LOW (ref 0.7–4.0)
MCH: 30.1 pg (ref 26.0–34.0)
MCHC: 34.4 g/dL (ref 30.0–36.0)
MCV: 87.5 fL (ref 80.0–100.0)
Monocytes Absolute: 0.1 10*3/uL (ref 0.1–1.0)
Monocytes Relative: 3 %
Neutro Abs: 1.8 10*3/uL (ref 1.7–7.7)
Neutrophils Relative %: 81 %
Platelet Count: 114 10*3/uL — ABNORMAL LOW (ref 150–400)
RBC: 3.12 MIL/uL — ABNORMAL LOW (ref 3.87–5.11)
RDW: 25.5 % — ABNORMAL HIGH (ref 11.5–15.5)
Smear Review: NORMAL
WBC Count: 2.2 10*3/uL — ABNORMAL LOW (ref 4.0–10.5)
nRBC: 0 % (ref 0.0–0.2)

## 2021-12-26 LAB — CMP (CANCER CENTER ONLY)
ALT: 11 U/L (ref 0–44)
AST: 14 U/L — ABNORMAL LOW (ref 15–41)
Albumin: 3.9 g/dL (ref 3.5–5.0)
Alkaline Phosphatase: 75 U/L (ref 38–126)
Anion gap: 8 (ref 5–15)
BUN: 30 mg/dL — ABNORMAL HIGH (ref 8–23)
CO2: 32 mmol/L (ref 22–32)
Calcium: 9.3 mg/dL (ref 8.9–10.3)
Chloride: 89 mmol/L — ABNORMAL LOW (ref 98–111)
Creatinine: 0.89 mg/dL (ref 0.44–1.00)
GFR, Estimated: >60 mL/min (ref >60)
Glucose, Bld: 228 mg/dL — ABNORMAL HIGH (ref 70–99)
Potassium: 2.9 mmol/L — ABNORMAL LOW (ref 3.5–5.1)
Sodium: 129 mmol/L — ABNORMAL LOW (ref 135–145)
Total Bilirubin: 0.5 mg/dL (ref 0.3–1.2)
Total Protein: 6.9 g/dL (ref 6.5–8.1)

## 2021-12-26 MED ORDER — LIDOCAINE VISCOUS HCL 2 % MT SOLN: 15.0000 mL | OROMUCOSAL | 3 refills | Status: DC | PRN

## 2021-12-26 MED ORDER — SODIUM CHLORIDE 0.9 % IV SOLN: Freq: Once | INTRAVENOUS | Status: AC

## 2021-12-26 MED ORDER — POTASSIUM CHLORIDE 10 MEQ/100ML IV SOLN: 10.0000 meq | Freq: Once | INTRAVENOUS | Status: DC

## 2021-12-26 MED ORDER — POTASSIUM CHLORIDE 10 MEQ/100ML IV SOLN
10.0000 meq | Freq: Once | INTRAVENOUS | Status: AC
  Administered 2021-12-26: 10 meq via INTRAVENOUS
  Filled 2021-12-26: qty 100

## 2021-12-26 MED ORDER — SODIUM CHLORIDE 0.9% FLUSH
10.0000 mL | INTRAVENOUS | Status: DC | PRN
  Administered 2021-12-26: 10 mL via INTRAVENOUS

## 2021-12-26 MED ORDER — LIDOCAINE VISCOUS HCL 2 % MT SOLN
15.0000 mL | OROMUCOSAL | 3 refills | Status: DC | PRN
  Filled 2021-12-26: qty 200, 7d supply, fill #0
  Filled 2022-01-01: qty 200, 7d supply, fill #1
  Filled 2022-01-15: qty 200, 7d supply, fill #2

## 2021-12-26 MED ORDER — HEPARIN SOD (PORK) LOCK FLUSH 100 UNIT/ML IV SOLN: 500.0000 [IU] | Freq: Once | INTRAVENOUS | Status: DC

## 2021-12-26 NOTE — Progress Notes (Signed)
Patient presented to infusion for IVF with bilateral wrist covered in dressings from a recent fall. Patient reports the dressings are from home and she is concerned about changing them due to the potential for adhesion. Dressings are soiled heavily with dirt and old blood. RN used warm water and gentle manipulation to remove dressings.  ? ?Wounds were crusted with old blood and skin debris. Area cleaned with soap and warm water. Debris removed. Skin tears show signs of pink tissue under and surrounding. No signs of infection. Some active bleeding on left posterior hand wound.  ? ?Petroleum jelly gauze and clean dry guaze applied and held in place with coban dressing. Bilateral wrist and hands covered. Patient reports she has supplies at home. Discussed changing bandages more frequently to prevent infection and adhesion.  ? ?Patient immediately reported relief of pain in her hands and wrist following dressing change. ?

## 2021-12-26 NOTE — Progress Notes (Signed)
Per C. Heilingoetter, okay to run 500cc over 1 hr instead of 1000 over 2 hrs.   ?

## 2021-12-26 NOTE — Telephone Encounter (Signed)
I called the patient to go over her labs. Her potassium was low at 2.9. I confirmed she is taking 40 meq in the AM and 20 meq in the PM. She mentioned she was hypotensive in radonc today. They were working on arranging for IVF. The infusion room is able to give 500 ccs today and 10 meq of potassium today. I also discussed with Dr. Julien Nordmann who recommended taking 40 meq of potassium chloride in the AM and the PM and we will recheck her labs next week. I talked to the patient in the blue room. I advised her to call me if she feels like she needs additional IV fluids in the interval and we would be happy to arrange or if she has worsening symptoms and feels like she needs to be evaluated sooner. Her BP upon recheck is 95/58. Her oxygen is 100%. Pulse 101 and temperature 97.4.  ?

## 2021-12-27 DIAGNOSIS — S8262XA Displaced fracture of lateral malleolus of left fibula, initial encounter for closed fracture: Secondary | ICD-10-CM | POA: Diagnosis not present

## 2021-12-27 DIAGNOSIS — M25572 Pain in left ankle and joints of left foot: Secondary | ICD-10-CM | POA: Diagnosis not present

## 2021-12-29 DIAGNOSIS — E119 Type 2 diabetes mellitus without complications: Secondary | ICD-10-CM | POA: Diagnosis not present

## 2021-12-29 DIAGNOSIS — I5042 Chronic combined systolic (congestive) and diastolic (congestive) heart failure: Secondary | ICD-10-CM | POA: Diagnosis not present

## 2021-12-29 DIAGNOSIS — J449 Chronic obstructive pulmonary disease, unspecified: Secondary | ICD-10-CM | POA: Diagnosis not present

## 2022-01-01 ENCOUNTER — Other Ambulatory Visit (HOSPITAL_COMMUNITY): Payer: Self-pay

## 2022-01-02 ENCOUNTER — Other Ambulatory Visit: Payer: Medicare Other

## 2022-01-02 ENCOUNTER — Encounter (HOSPITAL_COMMUNITY): Payer: Self-pay | Admitting: Emergency Medicine

## 2022-01-02 ENCOUNTER — Emergency Department (HOSPITAL_COMMUNITY)
Admission: EM | Admit: 2022-01-02 | Discharge: 2022-01-03 | Disposition: A | Discharge status: Home / Self Care | Payer: Medicare Other | Attending: Emergency Medicine | Admitting: Emergency Medicine

## 2022-01-02 ENCOUNTER — Inpatient Hospital Stay: Payer: Medicare Other

## 2022-01-02 ENCOUNTER — Inpatient Hospital Stay: Payer: Medicare Other | Attending: Internal Medicine

## 2022-01-02 ENCOUNTER — Other Ambulatory Visit: Payer: Self-pay

## 2022-01-02 DIAGNOSIS — Z20822 Contact with and (suspected) exposure to covid-19: Secondary | ICD-10-CM | POA: Diagnosis not present

## 2022-01-02 DIAGNOSIS — I110 Hypertensive heart disease with heart failure: Secondary | ICD-10-CM | POA: Insufficient documentation

## 2022-01-02 DIAGNOSIS — I2510 Atherosclerotic heart disease of native coronary artery without angina pectoris: Secondary | ICD-10-CM | POA: Diagnosis not present

## 2022-01-02 DIAGNOSIS — Z794 Long term (current) use of insulin: Secondary | ICD-10-CM | POA: Diagnosis not present

## 2022-01-02 DIAGNOSIS — R630 Anorexia: Secondary | ICD-10-CM | POA: Diagnosis not present

## 2022-01-02 DIAGNOSIS — Z7984 Long term (current) use of oral hypoglycemic drugs: Secondary | ICD-10-CM | POA: Insufficient documentation

## 2022-01-02 DIAGNOSIS — R634 Abnormal weight loss: Secondary | ICD-10-CM | POA: Diagnosis not present

## 2022-01-02 DIAGNOSIS — Z85118 Personal history of other malignant neoplasm of bronchus and lung: Secondary | ICD-10-CM | POA: Insufficient documentation

## 2022-01-02 DIAGNOSIS — I509 Heart failure, unspecified: Secondary | ICD-10-CM | POA: Insufficient documentation

## 2022-01-02 DIAGNOSIS — E1165 Type 2 diabetes mellitus with hyperglycemia: Secondary | ICD-10-CM | POA: Diagnosis not present

## 2022-01-02 DIAGNOSIS — Z79899 Other long term (current) drug therapy: Secondary | ICD-10-CM | POA: Insufficient documentation

## 2022-01-02 DIAGNOSIS — Z8673 Personal history of transient ischemic attack (TIA), and cerebral infarction without residual deficits: Secondary | ICD-10-CM | POA: Insufficient documentation

## 2022-01-02 DIAGNOSIS — D649 Anemia, unspecified: Secondary | ICD-10-CM | POA: Diagnosis not present

## 2022-01-02 DIAGNOSIS — Z7982 Long term (current) use of aspirin: Secondary | ICD-10-CM | POA: Diagnosis not present

## 2022-01-02 DIAGNOSIS — Z8719 Personal history of other diseases of the digestive system: Secondary | ICD-10-CM | POA: Insufficient documentation

## 2022-01-02 DIAGNOSIS — D72819 Decreased white blood cell count, unspecified: Secondary | ICD-10-CM | POA: Diagnosis not present

## 2022-01-02 DIAGNOSIS — D6481 Anemia due to antineoplastic chemotherapy: Secondary | ICD-10-CM

## 2022-01-02 DIAGNOSIS — Z7902 Long term (current) use of antithrombotics/antiplatelets: Secondary | ICD-10-CM | POA: Insufficient documentation

## 2022-01-02 DIAGNOSIS — E860 Dehydration: Secondary | ICD-10-CM | POA: Insufficient documentation

## 2022-01-02 DIAGNOSIS — R531 Weakness: Secondary | ICD-10-CM | POA: Insufficient documentation

## 2022-01-02 DIAGNOSIS — C3401 Malignant neoplasm of right main bronchus: Secondary | ICD-10-CM | POA: Insufficient documentation

## 2022-01-02 DIAGNOSIS — D509 Iron deficiency anemia, unspecified: Secondary | ICD-10-CM | POA: Insufficient documentation

## 2022-01-02 DIAGNOSIS — Z95828 Presence of other vascular implants and grafts: Secondary | ICD-10-CM

## 2022-01-02 LAB — URINALYSIS, ROUTINE W REFLEX MICROSCOPIC
Bacteria, UA: NONE SEEN
Bilirubin Urine: NEGATIVE
Glucose, UA: >=500 mg/dL — AB
Hgb urine dipstick: NEGATIVE
Ketones, ur: 20 mg/dL — AB
Leukocytes,Ua: NEGATIVE
Nitrite: NEGATIVE
Protein, ur: NEGATIVE mg/dL
Specific Gravity, Urine: 1.016 (ref 1.005–1.030)
pH: 5 (ref 5.0–8.0)

## 2022-01-02 LAB — CBC WITH DIFFERENTIAL (CANCER CENTER ONLY)
Abs Immature Granulocytes: 0.02 10*3/uL (ref 0.00–0.07)
Basophils Absolute: 0 10*3/uL (ref 0.0–0.1)
Basophils Relative: 1 %
Eosinophils Absolute: 0 10*3/uL (ref 0.0–0.5)
Eosinophils Relative: 0 %
HCT: 27.5 % — ABNORMAL LOW (ref 36.0–46.0)
Hemoglobin: 9.4 g/dL — ABNORMAL LOW (ref 12.0–15.0)
Immature Granulocytes: 1 %
Lymphocytes Relative: 14 %
Lymphs Abs: 0.2 10*3/uL — ABNORMAL LOW (ref 0.7–4.0)
MCH: 30.9 pg (ref 26.0–34.0)
MCHC: 34.2 g/dL (ref 30.0–36.0)
MCV: 90.5 fL (ref 80.0–100.0)
Monocytes Absolute: 0.4 10*3/uL (ref 0.1–1.0)
Monocytes Relative: 26 %
Neutro Abs: 1 10*3/uL — ABNORMAL LOW (ref 1.7–7.7)
Neutrophils Relative %: 58 %
Platelet Count: 127 10*3/uL — ABNORMAL LOW (ref 150–400)
RBC: 3.04 MIL/uL — ABNORMAL LOW (ref 3.87–5.11)
RDW: 25.8 % — ABNORMAL HIGH (ref 11.5–15.5)
WBC Count: 1.7 10*3/uL — ABNORMAL LOW (ref 4.0–10.5)
nRBC: 3.6 % — ABNORMAL HIGH (ref 0.0–0.2)

## 2022-01-02 LAB — COMPREHENSIVE METABOLIC PANEL
ALT: 16 U/L (ref 0–44)
AST: 14 U/L — ABNORMAL LOW (ref 15–41)
Albumin: 3.5 g/dL (ref 3.5–5.0)
Alkaline Phosphatase: 94 U/L (ref 38–126)
Anion gap: 15 (ref 5–15)
BUN: 42 mg/dL — ABNORMAL HIGH (ref 8–23)
CO2: 29 mmol/L (ref 22–32)
Calcium: 9.5 mg/dL (ref 8.9–10.3)
Chloride: 85 mmol/L — ABNORMAL LOW (ref 98–111)
Creatinine, Ser: 0.99 mg/dL (ref 0.44–1.00)
GFR, Estimated: >60 mL/min (ref >60)
Glucose, Bld: 342 mg/dL — ABNORMAL HIGH (ref 70–99)
Potassium: 3.4 mmol/L — ABNORMAL LOW (ref 3.5–5.1)
Sodium: 129 mmol/L — ABNORMAL LOW (ref 135–145)
Total Bilirubin: 0.8 mg/dL (ref 0.3–1.2)
Total Protein: 7.4 g/dL (ref 6.5–8.1)

## 2022-01-02 LAB — CMP (CANCER CENTER ONLY)
ALT: 11 U/L (ref 0–44)
AST: 11 U/L — ABNORMAL LOW (ref 15–41)
Albumin: 3.9 g/dL (ref 3.5–5.0)
Alkaline Phosphatase: 93 U/L (ref 38–126)
Anion gap: 13 (ref 5–15)
BUN: 41 mg/dL — ABNORMAL HIGH (ref 8–23)
CO2: 31 mmol/L (ref 22–32)
Calcium: 9.7 mg/dL (ref 8.9–10.3)
Chloride: 85 mmol/L — ABNORMAL LOW (ref 98–111)
Creatinine: 1.02 mg/dL — ABNORMAL HIGH (ref 0.44–1.00)
GFR, Estimated: >60 mL/min (ref >60)
Glucose, Bld: 389 mg/dL — ABNORMAL HIGH (ref 70–99)
Potassium: 3.5 mmol/L (ref 3.5–5.1)
Sodium: 129 mmol/L — ABNORMAL LOW (ref 135–145)
Total Bilirubin: 0.7 mg/dL (ref 0.3–1.2)
Total Protein: 7.3 g/dL (ref 6.5–8.1)

## 2022-01-02 LAB — RESP PANEL BY RT-PCR (FLU A&B, COVID) ARPGX2
Influenza A by PCR: NEGATIVE
Influenza B by PCR: NEGATIVE
SARS Coronavirus 2 by RT PCR: NEGATIVE

## 2022-01-02 LAB — MAGNESIUM: Magnesium: 2.6 mg/dL — ABNORMAL HIGH (ref 1.7–2.4)

## 2022-01-02 LAB — LACTIC ACID, PLASMA
Lactic Acid, Venous: 1.3 mmol/L (ref 0.5–1.9)
Lactic Acid, Venous: 1.4 mmol/L (ref 0.5–1.9)

## 2022-01-02 LAB — CBG MONITORING, ED: Glucose-Capillary: 342 mg/dL — ABNORMAL HIGH (ref 70–99)

## 2022-01-02 MED ORDER — SODIUM CHLORIDE 0.9% FLUSH
10.0000 mL | INTRAVENOUS | Status: DC | PRN
  Administered 2022-01-02: 10 mL via INTRAVENOUS

## 2022-01-02 MED ORDER — LACTATED RINGERS IV BOLUS
1000.0000 mL | Freq: Once | INTRAVENOUS | Status: AC
  Administered 2022-01-02: 1000 mL via INTRAVENOUS

## 2022-01-02 NOTE — ED Triage Notes (Signed)
Per pt, states she has been weak for 2 weeks-states she "feels like she is going to die" ?

## 2022-01-02 NOTE — ED Notes (Signed)
Lab notified of Mag add-on.  ?

## 2022-01-02 NOTE — ED Provider Notes (Signed)
?[facility] DEPT ?Provider Note ? ? ?CSN: [id number] ?Arrival date & time: 01/02/22  1502 ? ?  ? ?History ? ?Chief Complaint  ?Patient presents with  ? Weakness  ? ? ?Diane [name] is a 66 y.o. female. ? ? ?Weakness ?Associated symptoms: nausea   ?Patient presents for generalized weakness over the past 2 weeks.  Her medical history includes CAD, T2DM, HLD, depression, OSA, migraine headaches, HTN, GERD, PAD, seizures, CVA, CHF, anemia, and lung cancer.  Lung cancer was diagnosed in December.  She reports that she completed chemo 2 months ago.  Radiation was completed 2 weeks ago.  She reports that she did well throughout these therapies.  However, since the completion of her radiation, she has had very poor appetite.  This has caused a progressive generalized weakness.  She reports a 20 pound weight loss in the past 2 weeks.  She currently undergoes IV fluid infusions weekly.  Her last IV fluid infusion was 1 week ago.  When she went to her infusion today, she stated that she felt extremely unwell.  Because of this, she was sent to the emergency department.  She did not receive any IV fluids prior to arrival.  Patient denies any recent vomiting or diarrhea.  She has had recent nausea but denies any currently.  She denies any areas of pain. ?  ? ?Home Medications ?Prior to Admission medications   ?Medication Sig Start Date End Date Taking? Authorizing Provider  ?acetaminophen (TYLENOL) 500 MG tablet Take 1,000 mg by mouth at bedtime.    [provider]  ?albuterol (PROVENTIL) (2.5 MG/3ML) 0.083% nebulizer solution Take 3 mLs (2.5 mg total) by nebulization every 6 (six) hours as needed for wheezing or shortness of breath. 08/30/21   [name], MD  ?albuterol (VENTOLIN HFA) 108 (90 Base) MCG/ACT inhaler Inhale 2 puffs into the lungs every 6 (six) hours as needed for wheezing or shortness of breath. 08/30/21   [name], MD  ?aspirin EC 81 MG tablet Take 81 mg by mouth every  evening.  05/16/15   Martinique, Peter M, MD  ?BD PEN NEEDLE NANO 2ND GEN 32G X 4 MM MISC Inject into the skin 3 (three) times daily. 10/11/21   [provider]  ?clopidogrel (PLAVIX) 75 MG tablet Take 1 tablet (75 mg total) by mouth daily. 02/17/21   Larey Dresser, MD  ?dexlansoprazole (DEXILANT) 60 MG capsule Take 1 capsule (60 mg total) by mouth every evening. 09/18/21   Collene Gobble, MD  ?digoxin (LANOXIN) 0.125 MG tablet Take 0.5 tablets (0.0625 mg total) by mouth daily. 02/17/21   Larey Dresser, MD  ?diphenhydrAMINE (BENADRYL) 25 mg capsule Take 25 mg by mouth 2 (two) times daily as needed for itching or allergies.    [provider]  ?ezetimibe (ZETIA) 10 MG tablet Take 1 tablet (10 mg total) by mouth daily. 02/17/21   Larey Dresser, MD  ?fluticasone Asencion Islam) 50 MCG/ACT nasal spray Place 2 sprays into both nostrils daily. ?Patient taking differently: Place 2 sprays into both nostrils daily as needed for allergies. 08/30/21   [name], MD  ?gabapentin (NEURONTIN) 300 MG capsule TAKE 2 CAPSULES DAILY AT   BEDTIME 11/07/21   Tomi Likens, Adam R, DO  ?glucose blood (ONETOUCH ULTRA) test strip 3 (three) times daily.    [provider]  ?guaiFENesin (MUCINEX) 600 MG 12 hr tablet Take 2 tablets (1,200 mg total) by mouth 2 (two) times daily. 09/18/21   Baltazar Apo  S, MD  ?HYDROcodone-acetaminophen (NORCO) 5-325 MG tablet Take 1 tablet by mouth every 6 (six) hours as needed for moderate pain. 12/18/21   Gery Pray, MD  ?icosapent Ethyl (VASCEPA) 1 g capsule Take 2 capsules (2 g total) by mouth 2 (two) times daily. 09/20/21   Rafael Bihari, FNP  ?insulin aspart protamine- aspart (NOVOLOG MIX 70/30) (70-30) 100 UNIT/ML injection Inject 5-65 Units into the skin See admin instructions. 65 units in the Am with breakfast and 5 units with lunch and 55 units with dinner    [provider]  ?levETIRAcetam (KEPPRA) 1000 MG tablet Take 1 tablet (1,000 mg total) by mouth 2 (two) times  daily. 09/18/21   Collene Gobble, MD  ?lidocaine (XYLOCAINE) 2 % solution Use 15 mLs in the mouth or throat as directed as needed for pain. 12/26/21   Gery Pray, MD  ?lidocaine-prilocaine (EMLA) cream Apply to the Port-A-Cath site 30-60 minutes before chemotherapy ?Patient not taking: Reported on 11/01/2021 10/04/21   Curt Bears, MD  ?metFORMIN (GLUCOPHAGE-XR) 500 MG 24 hr tablet Take 500 mg by mouth every evening.  08/30/17   [provider]  ?metolazone (ZAROXOLYN) 2.5 MG tablet Take 1 tablet (2.5 mg total) by mouth 2 (two) times a week. Every Mon and Fri 02/20/21   Larey Dresser, MD  ?nicotine (NICODERM CQ - DOSED IN MG/24 HOURS) 21 mg/24hr patch Place 21 mg onto the skin daily as needed (to cease smoking).    [provider]  ?nicotine polacrilex (NICORETTE) 4 MG gum Take 4 mg by mouth daily as needed for smoking cessation.    [provider]  ?nitroGLYCERIN (NITROLINGUAL) 0.4 MG/SPRAY spray Place 1 spray under the tongue every 5 (five) minutes x 3 doses as needed for chest pain. 05/25/19   Martinique, Peter M, MD  ?nystatin (MYCOSTATIN/NYSTOP) powder APPLY 1 APPLICATION        TOPICALLY 4 TIMES A DAY AS NEEDED FOR YEAST INFECTIONS 02/23/21   Tower, [name], MD  ?ONE TOUCH ULTRA TEST test strip USE AS DIRECTED FOR TESTING BLOOD GLUCOSE 3 TIMES DAILY 08/07/16   [provider]  ?polyethylene glycol (MIRALAX / GLYCOLAX) 17 g packet Take 17 g by mouth daily.    [provider]  ?potassium chloride SA (KLOR-CON M20) 20 MEQ tablet Take 2 tablets (40 mEq total) by mouth every morning AND 1 tablet (20 mEq total) every evening. 09/20/21   Rafael Bihari, FNP  ?prochlorperazine (COMPAZINE) 10 MG tablet Take 1 tablet (10 mg total) by mouth every 6 (six) hours as needed for nausea or vomiting. 10/04/21   Curt Bears, MD  ?promethazine (PHENERGAN) 12.5 MG tablet Take 1 tablet (12.5 mg total) by mouth every 6 (six) hours as needed for nausea or vomiting. 10/23/21    Heilingoetter, Cassandra L, PA-C  ?ranolazine (RANEXA) 1000 MG SR tablet Take 1 tablet (1,000 mg total) by mouth 2 (two) times daily. 02/17/21   Larey Dresser, MD  ?rosuvastatin (CRESTOR) 20 MG tablet Take 1 tablet (20 mg total) by mouth at bedtime. 09/18/21   Collene Gobble, MD  ?sodium chloride (OCEAN) 0.65 % SOLN nasal spray Place 1 spray into both nostrils as needed for congestion.    [provider]  ?spironolactone (ALDACTONE) 25 MG tablet Take 1 tablet (25 mg total) by mouth at bedtime. TAKE 1 TABLET BY MOUTH EVERYDAY AT BEDTIME 09/18/21   Collene Gobble, MD  ?sucralfate (CARAFATE) 1 g tablet Take 1 tablet (1 g total)  by mouth 4 (four) times daily -  with meals and at bedtime. Crush and dissolve in 10 mL of warm water prior to swallowing 12/05/21   Gery Pray, MD  ?torsemide (DEMADEX) 20 MG tablet Take 3 tablets (60 mg total) by mouth 2 (two) times daily. 09/20/21   Rafael Bihari, FNP  ?triamcinolone cream (KENALOG) 0.5 % Apply 1 application topically 3 (three) times daily as needed (rash). To affected areas 09/18/21   Collene Gobble, MD  ?Vericiguat 10 MG TABS Take 10 mg by mouth daily. 09/18/21 09/18/22  Collene Gobble, MD  ?   ? ?Allergies    ?Bee venom, Benadryl [diphenhydramine], Entresto [sacubitril-valsartan], Farxiga [dapagliflozin], Tetracycline, Canagliflozin, Sitagliptin, and Ace inhibitors   ? ?Review of Systems   ?Review of Systems  ?Constitutional:  Positive for appetite change, fatigue and unexpected weight change.  ?HENT:  Positive for trouble swallowing (esophageal burning).   ?Gastrointestinal:  Positive for nausea.  ?Skin:  Positive for wound (old skin tears).  ?Neurological:  Positive for weakness (generalized).  ?All other systems reviewed and are negative. ? ?Physical Exam ?Updated Vital Signs ?BP 138/90 (BP Location: Left Arm)   Pulse (!) 107   Temp (!) 97.5 ?F (36.4 ?C) (Oral)   Resp 18   LMP 09/02/2003   SpO2 97%  ?Physical Exam ?Vitals and nursing note  reviewed.  ?Constitutional:   ?   General: She is not in acute distress. ?   Appearance: She is well-developed and normal weight. She is ill-appearing (Chronically). She is not toxic-appearing or diaphoretic.  ?HENT:  ?

## 2022-01-02 NOTE — ED Notes (Signed)
Pt wants port accessed for labs ?

## 2022-01-02 NOTE — ED Notes (Signed)
Pt reports she isn't feeling any better after receiving a bolus and food/drink.  ?

## 2022-01-02 NOTE — ED Notes (Signed)
Pt given sandwich and apple sauce per EDP.  ?

## 2022-01-02 NOTE — Progress Notes (Signed)
Patient here today for IVF's, when being brought back to the infusion room she stated "I feel like I am going to die". Requested to see Cassie and to be sent to the ED. Cassie, PA made aware, orders given to send patient to the ED. PAC left accessed, intact with biopatch on.  ?

## 2022-01-02 NOTE — ED Notes (Signed)
Port was accessed and basic labs were completed at [facility] prior to arrival.  CMP and Lactic drawn and sent from ED.   ?

## 2022-01-02 NOTE — ED Provider Triage Note (Signed)
Emergency Medicine Provider Triage Evaluation Note ? ?Diane [name] , a 66 y.o. female  was evaluated in triage.  Pt complains of weakness, hx lung, esophagus cancer. Unknown date of last chemo. Currently on weekly IV fluids, feels like needs more frequent. Unable to drink due to strictures.  ? ?Review of Systems  ?Positive: Weakness, decreased PO intake, decreased urine output ?Negative: fever ? ?Physical Exam  ?BP 107/66 (BP Location: Left Arm)   Pulse (!) 105   Temp (!) 97.5 ?F (36.4 ?C) (Oral)   Resp 18   LMP 09/02/2003   SpO2 100%  ?Gen:   Awake, no distress   ?Resp:  Normal effort  ?MSK:   Moves extremities without difficulty  ?Other:  Chronically ill appearing  ? ?Medical Decision Making  ?Medically screening exam initiated at 3:15 PM.  Appropriate orders placed.  Diane [name] was informed that the remainder of the evaluation will be completed by another provider, this initial triage assessment does not replace that evaluation, and the importance of remaining in the ED until their evaluation is complete. ? ? ?  ?Tacy Learn, PA-C ?01/02/22 1515 ? ?

## 2022-01-03 ENCOUNTER — Other Ambulatory Visit: Payer: Self-pay | Admitting: Family Medicine

## 2022-01-03 ENCOUNTER — Other Ambulatory Visit (HOSPITAL_COMMUNITY): Payer: Self-pay

## 2022-01-03 ENCOUNTER — Emergency Department (HOSPITAL_COMMUNITY): Payer: Medicare Other

## 2022-01-03 DIAGNOSIS — R531 Weakness: Secondary | ICD-10-CM | POA: Diagnosis not present

## 2022-01-03 LAB — DIGOXIN LEVEL: Digoxin Level: 0.4 ng/mL — ABNORMAL LOW (ref 0.8–2.0)

## 2022-01-03 LAB — BRAIN NATRIURETIC PEPTIDE: B Natriuretic Peptide: 231 pg/mL — ABNORMAL HIGH (ref 0.0–100.0)

## 2022-01-03 MED ORDER — LACTATED RINGERS IV BOLUS
1000.0000 mL | Freq: Once | INTRAVENOUS | Status: AC
  Administered 2022-01-03: 1000 mL via INTRAVENOUS

## 2022-01-03 NOTE — Telephone Encounter (Signed)
Last refill was given by GI but you filled in the past. Ok to refill?  ?

## 2022-01-03 NOTE — Discharge Instructions (Signed)
Talk to primary care doctor about the current medications that you are on.  Some of these can cause dehydration if you are not taking in food and fluids by mouth.  Please return to the emergency department for any new or worsening symptoms. ?

## 2022-01-09 ENCOUNTER — Telehealth: Payer: Self-pay

## 2022-01-09 ENCOUNTER — Telehealth: Payer: Self-pay | Admitting: Medical Oncology

## 2022-01-09 ENCOUNTER — Other Ambulatory Visit: Payer: Self-pay

## 2022-01-09 ENCOUNTER — Other Ambulatory Visit: Payer: Medicare Other

## 2022-01-09 ENCOUNTER — Inpatient Hospital Stay: Payer: Medicare Other

## 2022-01-09 DIAGNOSIS — Z79899 Other long term (current) drug therapy: Secondary | ICD-10-CM | POA: Diagnosis not present

## 2022-01-09 DIAGNOSIS — C3401 Malignant neoplasm of right main bronchus: Secondary | ICD-10-CM | POA: Diagnosis not present

## 2022-01-09 DIAGNOSIS — Z8673 Personal history of transient ischemic attack (TIA), and cerebral infarction without residual deficits: Secondary | ICD-10-CM | POA: Insufficient documentation

## 2022-01-09 DIAGNOSIS — D509 Iron deficiency anemia, unspecified: Secondary | ICD-10-CM | POA: Diagnosis not present

## 2022-01-09 DIAGNOSIS — Z95828 Presence of other vascular implants and grafts: Secondary | ICD-10-CM

## 2022-01-09 DIAGNOSIS — Z8719 Personal history of other diseases of the digestive system: Secondary | ICD-10-CM | POA: Diagnosis not present

## 2022-01-09 DIAGNOSIS — T451X5A Adverse effect of antineoplastic and immunosuppressive drugs, initial encounter: Secondary | ICD-10-CM

## 2022-01-09 LAB — CBC WITH DIFFERENTIAL (CANCER CENTER ONLY)
Abs Immature Granulocytes: 0.01 10*3/uL (ref 0.00–0.07)
Basophils Absolute: 0 10*3/uL (ref 0.0–0.1)
Basophils Relative: 0 %
Eosinophils Absolute: 0 10*3/uL (ref 0.0–0.5)
Eosinophils Relative: 0 %
HCT: 24.4 % — ABNORMAL LOW (ref 36.0–46.0)
Hemoglobin: 8.2 g/dL — ABNORMAL LOW (ref 12.0–15.0)
Immature Granulocytes: 0 %
Lymphocytes Relative: 9 %
Lymphs Abs: 0.3 10*3/uL — ABNORMAL LOW (ref 0.7–4.0)
MCH: 31.8 pg (ref 26.0–34.0)
MCHC: 33.6 g/dL (ref 30.0–36.0)
MCV: 94.6 fL (ref 80.0–100.0)
Monocytes Absolute: 0.4 10*3/uL (ref 0.1–1.0)
Monocytes Relative: 13 %
Neutro Abs: 2.6 10*3/uL (ref 1.7–7.7)
Neutrophils Relative %: 78 %
Platelet Count: 114 10*3/uL — ABNORMAL LOW (ref 150–400)
RBC: 2.58 MIL/uL — ABNORMAL LOW (ref 3.87–5.11)
RDW: 27.4 % — ABNORMAL HIGH (ref 11.5–15.5)
WBC Count: 3.3 10*3/uL — ABNORMAL LOW (ref 4.0–10.5)
nRBC: 0 % (ref 0.0–0.2)

## 2022-01-09 LAB — CMP (CANCER CENTER ONLY)
ALT: 16 U/L (ref 0–44)
AST: 15 U/L (ref 15–41)
Albumin: 3.5 g/dL (ref 3.5–5.0)
Alkaline Phosphatase: 78 U/L (ref 38–126)
Anion gap: 6 (ref 5–15)
BUN: 5 mg/dL — ABNORMAL LOW (ref 8–23)
CO2: 30 mmol/L (ref 22–32)
Calcium: 8.5 mg/dL — ABNORMAL LOW (ref 8.9–10.3)
Chloride: 99 mmol/L (ref 98–111)
Creatinine: 0.5 mg/dL (ref 0.44–1.00)
GFR, Estimated: >60 mL/min (ref >60)
Glucose, Bld: 250 mg/dL — ABNORMAL HIGH (ref 70–99)
Potassium: 2.7 mmol/L — CL (ref 3.5–5.1)
Sodium: 135 mmol/L (ref 135–145)
Total Bilirubin: 0.4 mg/dL (ref 0.3–1.2)
Total Protein: 6.4 g/dL — ABNORMAL LOW (ref 6.5–8.1)

## 2022-01-09 MED ORDER — HEPARIN SOD (PORK) LOCK FLUSH 100 UNIT/ML IV SOLN
500.0000 [IU] | INTRAVENOUS | Status: AC | PRN
  Administered 2022-01-09: 500 [IU]

## 2022-01-09 MED ORDER — SODIUM CHLORIDE 0.9% FLUSH
10.0000 mL | INTRAVENOUS | Status: AC | PRN
  Administered 2022-01-09: 10 mL

## 2022-01-09 NOTE — Telephone Encounter (Signed)
Hypokalemia -2.7 Per Dr Julien Nordmann I instructed pt to take 40 meq in am and 40 meq in pm. ? ?Pt instructed to f/u with [facility].[name] ?

## 2022-01-09 NOTE — Telephone Encounter (Addendum)
CRITICAL VALUE STICKER ? ?CRITICAL VALUE: K = 2.7 ? ?RECEIVER (on-site recipient of call): Yetta Glassman, CMA ? ?DATE & TIME NOTIFIED: 01/09/22 AT 3:00PM ? ?MESSENGER (representative from lab): LAUREN ? ?MD NOTIFIED: MOHAMED ? ?TIME OF NOTIFICATION: 01/09/22 AT 3:05PM ? ?RESPONSE: Please advise Dr. Julien Nordmann ? ?Dr Julien Nordmann notified and instructions called to pt. ? ?

## 2022-01-09 NOTE — Telephone Encounter (Signed)
Pt notified of low K+.@ 2.7.  She stated she is taking KDUR 40 meq in am and 20 meq. ?

## 2022-01-10 ENCOUNTER — Telehealth: Payer: Self-pay | Admitting: Internal Medicine

## 2022-01-10 ENCOUNTER — Other Ambulatory Visit: Payer: Self-pay | Admitting: Medical Oncology

## 2022-01-10 ENCOUNTER — Telehealth (HOSPITAL_COMMUNITY): Payer: Self-pay | Admitting: Cardiology

## 2022-01-10 ENCOUNTER — Other Ambulatory Visit: Payer: Self-pay

## 2022-01-10 DIAGNOSIS — C3491 Malignant neoplasm of unspecified part of right bronchus or lung: Secondary | ICD-10-CM

## 2022-01-10 NOTE — Telephone Encounter (Signed)
.  Called patient to schedule appointment per 4/12 inbasket, patient is aware of date and time.   ?

## 2022-01-11 ENCOUNTER — Inpatient Hospital Stay: Payer: Medicare Other

## 2022-01-11 ENCOUNTER — Other Ambulatory Visit: Payer: Medicare Other

## 2022-01-11 ENCOUNTER — Telehealth: Payer: Self-pay | Admitting: Medical Oncology

## 2022-01-11 ENCOUNTER — Inpatient Hospital Stay: Payer: Medicare Other | Admitting: Internal Medicine

## 2022-01-11 NOTE — Telephone Encounter (Signed)
Pt notified that she does not need port flush or labs until 4/26 appt. ?

## 2022-01-15 ENCOUNTER — Telehealth: Payer: Self-pay | Admitting: Radiology

## 2022-01-15 ENCOUNTER — Other Ambulatory Visit (HOSPITAL_COMMUNITY): Payer: Self-pay

## 2022-01-15 NOTE — Telephone Encounter (Signed)
Patient states she is having pain difficulty swallowing both solids and liquids despite 2 medications already prescribed. Has lost 20 pounds over last 1 month. Please advise. ?

## 2022-01-16 ENCOUNTER — Encounter: Payer: Self-pay | Admitting: Physician Assistant

## 2022-01-16 ENCOUNTER — Other Ambulatory Visit (HOSPITAL_COMMUNITY): Payer: Self-pay

## 2022-01-16 ENCOUNTER — Encounter: Payer: Self-pay | Admitting: Internal Medicine

## 2022-01-16 NOTE — Telephone Encounter (Signed)
LMOM to return call for recommendations from Dr Sondra Come. ?

## 2022-01-16 NOTE — Telephone Encounter (Signed)
Notified patient that script for long acting pain medication will be sent to pharmacy. Patient inquires about stretching her esophagus as it has been done in the past. Dr Sondra Come states he can send a referral to GI. Patient in agreement. ?

## 2022-01-17 ENCOUNTER — Telehealth: Payer: Self-pay

## 2022-01-17 ENCOUNTER — Telehealth: Payer: Self-pay | Admitting: *Deleted

## 2022-01-17 ENCOUNTER — Other Ambulatory Visit (HOSPITAL_COMMUNITY): Payer: Self-pay

## 2022-01-17 ENCOUNTER — Other Ambulatory Visit: Payer: Self-pay | Admitting: Radiation Oncology

## 2022-01-17 ENCOUNTER — Other Ambulatory Visit: Payer: Self-pay

## 2022-01-17 ENCOUNTER — Telehealth: Payer: Self-pay | Admitting: Radiology

## 2022-01-17 ENCOUNTER — Other Ambulatory Visit: Payer: Medicare Other

## 2022-01-17 DIAGNOSIS — K209 Esophagitis, unspecified without bleeding: Secondary | ICD-10-CM

## 2022-01-17 MED ORDER — LIDOCAINE VISCOUS HCL 2 % MT SOLN: 15.0000 mL | OROMUCOSAL | 3 refills | Status: DC | PRN

## 2022-01-17 MED ORDER — MORPHINE SULFATE ER 15 MG PO TBCR: 15.0000 mg | EXTENDED_RELEASE_TABLET | Freq: Two times a day (BID) | ORAL | 0 refills | Status: DC

## 2022-01-17 NOTE — Progress Notes (Signed)
G

## 2022-01-17 NOTE — Telephone Encounter (Signed)
Patient would like script for a long acting pain medication and viscous lidocaine sent to [facility] on [address] because the [facility] does not have it. ?

## 2022-01-17 NOTE — Telephone Encounter (Signed)
Notified patient and Pharmacy of prior authorization approval for Morphine Sulfate Er 15mg  Tablets. Medication is approved through 01/17/2023. No other needs or concerns voiced at this time. ?

## 2022-01-17 NOTE — Telephone Encounter (Signed)
Called patient to make aware of GI referral to Dr. Fuller Plan for esophagitis and esophageal stricture.  ?

## 2022-01-17 NOTE — Telephone Encounter (Signed)
CALLED PATIENT TO INFORM OF APPT WITH PA JESSICA ZEHR ON 02-02-22- ARRIVAL TIME- 2:45 PM @ DR. STARK'S OFFICE, SPOKE WITH PATIENT AND SHE IS AWARE OF THIS APPT. ?

## 2022-01-18 ENCOUNTER — Encounter: Payer: Self-pay | Admitting: Physician Assistant

## 2022-01-18 ENCOUNTER — Ambulatory Visit (HOSPITAL_COMMUNITY)
Admission: RE | Admit: 2022-01-18 | Discharge: 2022-01-18 | Disposition: A | Discharge status: Home / Self Care | Payer: Medicare Other | Source: Ambulatory Visit | Attending: Physician Assistant | Admitting: Physician Assistant

## 2022-01-18 ENCOUNTER — Encounter: Payer: Self-pay | Admitting: Internal Medicine

## 2022-01-18 DIAGNOSIS — C3491 Malignant neoplasm of unspecified part of right bronchus or lung: Secondary | ICD-10-CM | POA: Insufficient documentation

## 2022-01-18 DIAGNOSIS — C3490 Malignant neoplasm of unspecified part of unspecified bronchus or lung: Secondary | ICD-10-CM | POA: Diagnosis not present

## 2022-01-18 DIAGNOSIS — Z20822 Contact with and (suspected) exposure to covid-19: Secondary | ICD-10-CM | POA: Diagnosis not present

## 2022-01-18 MED ORDER — SODIUM CHLORIDE (PF) 0.9 % IJ SOLN
INTRAMUSCULAR | Status: DC
Start: 2022-01-18 — End: 2022-01-19
  Filled 2022-01-18: qty 50

## 2022-01-18 MED ORDER — HEPARIN SOD (PORK) LOCK FLUSH 100 UNIT/ML IV SOLN
INTRAVENOUS | Status: AC
  Filled 2022-01-18: qty 5

## 2022-01-18 MED ORDER — IOHEXOL 300 MG/ML  SOLN
75.0000 mL | Freq: Once | INTRAMUSCULAR | Status: AC | PRN
  Administered 2022-01-18: 75 mL via INTRAVENOUS

## 2022-01-18 MED ORDER — HEPARIN SOD (PORK) LOCK FLUSH 100 UNIT/ML IV SOLN
500.0000 [IU] | Freq: Once | INTRAVENOUS | Status: AC
  Administered 2022-01-18: 500 [IU] via INTRAVENOUS

## 2022-01-22 ENCOUNTER — Encounter: Payer: Self-pay | Admitting: Radiology

## 2022-01-23 ENCOUNTER — Encounter: Payer: Self-pay | Admitting: Physician Assistant

## 2022-01-23 ENCOUNTER — Ambulatory Visit (INDEPENDENT_AMBULATORY_CARE_PROVIDER_SITE_OTHER): Payer: Medicare Other

## 2022-01-23 DIAGNOSIS — I255 Ischemic cardiomyopathy: Secondary | ICD-10-CM | POA: Diagnosis not present

## 2022-01-23 DIAGNOSIS — Z95828 Presence of other vascular implants and grafts: Secondary | ICD-10-CM | POA: Insufficient documentation

## 2022-01-23 LAB — CUP PACEART REMOTE DEVICE CHECK
Battery Remaining Longevity: 162 mo
Battery Remaining Percentage: 100 %
Brady Statistic RV Percent Paced: 0 %
Date Time Interrogation Session: 20230425041100
HighPow Impedance: 69 Ohm
Implantable Lead Implant Date: 20211015
Implantable Lead Location: 753860
Implantable Lead Model: 672
Implantable Lead Serial Number: 166771
Implantable Pulse Generator Implant Date: 20211015
Lead Channel Impedance Value: 711 Ohm
Lead Channel Setting Pacing Amplitude: 3.5 V
Lead Channel Setting Pacing Pulse Width: 0.4 ms
Lead Channel Setting Sensing Sensitivity: 0.5 mV
Pulse Gen Serial Number: 279742

## 2022-01-24 ENCOUNTER — Inpatient Hospital Stay: Payer: Medicare Other

## 2022-01-24 ENCOUNTER — Inpatient Hospital Stay (HOSPITAL_BASED_OUTPATIENT_CLINIC_OR_DEPARTMENT_OTHER): Payer: Medicare Other | Admitting: Internal Medicine

## 2022-01-24 ENCOUNTER — Other Ambulatory Visit: Payer: Self-pay

## 2022-01-24 ENCOUNTER — Encounter: Payer: Self-pay | Admitting: Internal Medicine

## 2022-01-24 VITALS — BP 98/61 | HR 96 | Temp 97.4°F | Resp 17 | Wt 112.1 lb

## 2022-01-24 DIAGNOSIS — Z95828 Presence of other vascular implants and grafts: Secondary | ICD-10-CM

## 2022-01-24 DIAGNOSIS — D509 Iron deficiency anemia, unspecified: Secondary | ICD-10-CM | POA: Diagnosis not present

## 2022-01-24 DIAGNOSIS — C3490 Malignant neoplasm of unspecified part of unspecified bronchus or lung: Secondary | ICD-10-CM | POA: Diagnosis not present

## 2022-01-24 DIAGNOSIS — Z8673 Personal history of transient ischemic attack (TIA), and cerebral infarction without residual deficits: Secondary | ICD-10-CM | POA: Diagnosis not present

## 2022-01-24 DIAGNOSIS — C3491 Malignant neoplasm of unspecified part of right bronchus or lung: Secondary | ICD-10-CM

## 2022-01-24 DIAGNOSIS — C3401 Malignant neoplasm of right main bronchus: Secondary | ICD-10-CM | POA: Diagnosis not present

## 2022-01-24 DIAGNOSIS — Z8719 Personal history of other diseases of the digestive system: Secondary | ICD-10-CM | POA: Diagnosis not present

## 2022-01-24 DIAGNOSIS — Z79899 Other long term (current) drug therapy: Secondary | ICD-10-CM | POA: Diagnosis not present

## 2022-01-24 LAB — CMP (CANCER CENTER ONLY)
ALT: 17 U/L (ref 0–44)
AST: 18 U/L (ref 15–41)
Albumin: 3.5 g/dL (ref 3.5–5.0)
Alkaline Phosphatase: 100 U/L (ref 38–126)
Anion gap: 6 (ref 5–15)
BUN: 8 mg/dL (ref 8–23)
CO2: 24 mmol/L (ref 22–32)
Calcium: 9.1 mg/dL (ref 8.9–10.3)
Chloride: 104 mmol/L (ref 98–111)
Creatinine: 0.56 mg/dL (ref 0.44–1.00)
GFR, Estimated: >60 mL/min (ref >60)
Glucose, Bld: 175 mg/dL — ABNORMAL HIGH (ref 70–99)
Potassium: 4.3 mmol/L (ref 3.5–5.1)
Sodium: 134 mmol/L — ABNORMAL LOW (ref 135–145)
Total Bilirubin: 0.5 mg/dL (ref 0.3–1.2)
Total Protein: 6.6 g/dL (ref 6.5–8.1)

## 2022-01-24 LAB — CBC WITH DIFFERENTIAL (CANCER CENTER ONLY)
Abs Immature Granulocytes: 0.01 10*3/uL (ref 0.00–0.07)
Basophils Absolute: 0.1 10*3/uL (ref 0.0–0.1)
Basophils Relative: 1 %
Eosinophils Absolute: 0 10*3/uL (ref 0.0–0.5)
Eosinophils Relative: 0 %
HCT: 35.3 % — ABNORMAL LOW (ref 36.0–46.0)
Hemoglobin: 11.2 g/dL — ABNORMAL LOW (ref 12.0–15.0)
Immature Granulocytes: 0 %
Lymphocytes Relative: 15 %
Lymphs Abs: 0.7 10*3/uL (ref 0.7–4.0)
MCH: 31.5 pg (ref 26.0–34.0)
MCHC: 31.7 g/dL (ref 30.0–36.0)
MCV: 99.2 fL (ref 80.0–100.0)
Monocytes Absolute: 0.7 10*3/uL (ref 0.1–1.0)
Monocytes Relative: 14 %
Neutro Abs: 3.1 10*3/uL (ref 1.7–7.7)
Neutrophils Relative %: 70 %
Platelet Count: 206 10*3/uL (ref 150–400)
RBC: 3.56 MIL/uL — ABNORMAL LOW (ref 3.87–5.11)
RDW: 19.5 % — ABNORMAL HIGH (ref 11.5–15.5)
WBC Count: 4.5 10*3/uL (ref 4.0–10.5)
nRBC: 0 % (ref 0.0–0.2)

## 2022-01-24 MED ORDER — SODIUM CHLORIDE 0.9% FLUSH
10.0000 mL | Freq: Once | INTRAVENOUS | Status: AC
  Administered 2022-01-24: 10 mL

## 2022-01-24 MED ORDER — HEPARIN SOD (PORK) LOCK FLUSH 100 UNIT/ML IV SOLN
500.0000 [IU] | Freq: Once | INTRAVENOUS | Status: AC
  Administered 2022-01-24: 500 [IU]

## 2022-01-24 NOTE — Progress Notes (Signed)
?    [facility] ?Telephone:([phone]) [phone]   [phone] ? ?OFFICE PROGRESS NOTE ? ?Tower, [name], MD ?[address] [place] ?[place] [address] ? ?DIAGNOSIS:  ?1) Limited stage (T3, N2, M0) small cell lung cancer presented with right hilar and mediastinal lymphadenopathy. There was no hypermetabolism in the left adrenal gland lesion. Diagnosed in December 2022. ?2)  Iron Deficiency Anemia ?  ?PRIOR THERAPY:  ?1) Systemic chemotherapy with systemic chemotherapy with carboplatin for an AUC of 5, etoposide 100 mg per metered squared, and Imfinzi 1500 mg IV every 3 weeks with Cosela for myeloprotection.  First dose on 10/16/2021.  Discontinued after 1 cycle as the patient staging PET scan showed limited stage disease. ?  ?CURRENT THERAPY: ?1) systemic chemotherapy with carboplatin for an AUC of 5 on day 1 and etoposide 100 mg/m2 on days 1, 2, and 3 IV every 3 weeks.  First dose on 11/06/2021.  This will be concurrent with radiotherapy under the care of Dr. Sondra Come.  Status post 3 cycles. ?2) Venofer infusions as needed. Last dose on 02/07/21  ? ?INTERVAL HISTORY: ?Diane [name] 66 y.o. female returns to the clinic today for follow-up visit.  The patient is feeling fine today with no concerning complaints except for mild fatigue.  She denied having any chest pain, shortness of breath except with exertion with no cough or hemoptysis.  She has no nausea, vomiting, diarrhea or constipation.  She has no headache or visual changes.  She has no recent weight loss or night sweats.  She continues to have odynophagia from her previous radiotherapy.  She is scheduled to see gastroenterology next week for evaluation and consideration of dilatation of her esophagus.  The patient had repeat CT scan of the chest performed recently and she is here for evaluation and discussion of her scan results. ? ? ?MEDICAL HISTORY: ?Past Medical History:  ?Diagnosis Date  ? Anemia   ? Arthritis   ? Carotid stenosis   ? a. s/p  right carotid stent 10/2017.  ? Chronic systolic CHF (congestive heart failure) ([place])   ? Colon polyps   ? COPD (chronic obstructive pulmonary disease) ([place])   ? Coronary artery disease   ? post CABG in 3/07 , coronary stents   ? Depression   ? after major stroke years ago, no longer treated  ? Diabetes mellitus   ? type 2  ? Dyslipidemia   ? Fatty liver   ? Fibromyalgia   ? GERD (gastroesophageal reflux disease)   ? Headache   ? hx of   ? History of kidney stones   ? History of radiation therapy   ? right lung 11/15/2021-12/26/2021  Dr Gery Pray  ? Hyperlipidemia   ? Hypertension   ? Myocardial infarction [facility])   ? Pneumonia   ? hx of   ? Seizures ([place])   ? last seizure- 03/2013   ? Shortness of breath dyspnea   ? with exertion or when fluid builds up   ? Sleep apnea   ? used to wear a cpap- not used in 3 years   ? Status post dilation of esophageal narrowing   ? Stroke [facility]) 1993  ? problems with balance   ? Systolic murmur   ? known mild AS and MR  ? Thyroid goiter   ? Tobacco abuse   ? ? ?ALLERGIES:  is allergic to bee venom, benadryl [diphenhydramine], entresto [sacubitril-valsartan], farxiga [dapagliflozin], tetracycline, canagliflozin, morphine sulfate er, sitagliptin, and  ace inhibitors. ? ?MEDICATIONS:  ?Current Outpatient Medications  ?Medication Sig Dispense Refill  ? albuterol (VENTOLIN HFA) 108 (90 Base) MCG/ACT inhaler Inhale 2 puffs into the lungs every 6 (six) hours as needed for wheezing or shortness of breath. 24 g 3  ? aspirin EC 81 MG tablet Take 81 mg by mouth every evening.  30 tablet 6  ? BD PEN NEEDLE NANO 2ND GEN 32G X 4 MM MISC Inject into the skin 3 (three) times daily.    ? clopidogrel (PLAVIX) 75 MG tablet Take 1 tablet (75 mg total) by mouth daily. 90 tablet 3  ? dexlansoprazole (DEXILANT) 60 MG capsule TAKE 1 CAPSULE DAILY 90 capsule 1  ? digoxin (LANOXIN) 0.125 MG tablet Take 0.5 tablets (0.0625 mg total) by mouth daily. 45 tablet 3  ? ezetimibe (ZETIA) 10 MG tablet Take 1 tablet (10  mg total) by mouth daily. 90 tablet 3  ? fluticasone (FLONASE) 50 MCG/ACT nasal spray Place 2 sprays into both nostrils daily. (Patient taking differently: Place 2 sprays into both nostrils daily as needed for allergies.) 48 g 3  ? gabapentin (NEURONTIN) 300 MG capsule TAKE 2 CAPSULES DAILY AT   BEDTIME 180 capsule 1  ? glucose blood (ONETOUCH ULTRA) test strip 3 (three) times daily.    ? guaiFENesin (MUCINEX) 600 MG 12 hr tablet Take 2 tablets (1,200 mg total) by mouth 2 (two) times daily. 360 tablet 3  ? HYDROcodone-acetaminophen (NORCO) 5-325 MG tablet Take 1 tablet by mouth every 6 (six) hours as needed for moderate pain. 30 tablet 0  ? icosapent Ethyl (VASCEPA) 1 g capsule Take 2 capsules (2 g total) by mouth 2 (two) times daily. 360 capsule 3  ? insulin aspart protamine- aspart (NOVOLOG MIX 70/30) (70-30) 100 UNIT/ML injection Inject 5-65 Units into the skin See admin instructions. 65 units in the Am with breakfast and 5 units with lunch and 55 units with dinner    ? levETIRAcetam (KEPPRA) 1000 MG tablet Take 1 tablet (1,000 mg total) by mouth 2 (two) times daily.    ? lidocaine (XYLOCAINE) 2 % solution Use 15 mLs in the mouth or throat as directed as needed for pain. 200 mL 3  ? metFORMIN (GLUCOPHAGE-XR) 500 MG 24 hr tablet Take 500 mg by mouth every evening.   6  ? metolazone (ZAROXOLYN) 2.5 MG tablet Take 1 tablet (2.5 mg total) by mouth 2 (two) times a week. Every Mon and Fri 30 tablet 3  ? nicotine polacrilex (NICORETTE) 4 MG gum Take 4 mg by mouth daily as needed for smoking cessation.    ? nitroGLYCERIN (NITROLINGUAL) 0.4 MG/SPRAY spray Place 1 spray under the tongue every 5 (five) minutes x 3 doses as needed for chest pain. 12 g 3  ? ONE TOUCH ULTRA TEST test strip USE AS DIRECTED FOR TESTING BLOOD GLUCOSE 3 TIMES DAILY  1  ? polyethylene glycol (MIRALAX / GLYCOLAX) 17 g packet Take 17 g by mouth daily.    ? potassium chloride SA (KLOR-CON M20) 20 MEQ tablet Take 2 tablets (40 mEq total) by mouth every  morning AND 1 tablet (20 mEq total) every evening. 270 tablet 3  ? acetaminophen (TYLENOL) 500 MG tablet Take 1,000 mg by mouth at bedtime. (Patient not taking: Reported on 01/24/2022)    ? albuterol (PROVENTIL) (2.5 MG/3ML) 0.083% nebulizer solution Take 3 mLs (2.5 mg total) by nebulization every 6 (six) hours as needed for wheezing or shortness of breath. (Patient not taking: Reported on 01/24/2022)  1180 mL 3  ? diphenhydrAMINE (BENADRYL) 25 mg capsule Take 25 mg by mouth 2 (two) times daily as needed for itching or allergies. (Patient not taking: Reported on 01/24/2022)    ? lidocaine-prilocaine (EMLA) cream Apply to the Port-A-Cath site 30-60 minutes before chemotherapy (Patient not taking: Reported on 11/01/2021) 30 g 0  ? nystatin (MYCOSTATIN/NYSTOP) powder APPLY 1 APPLICATION        TOPICALLY 4 TIMES A DAY AS NEEDED FOR YEAST INFECTIONS (Patient not taking: Reported on 01/24/2022) 60 g 5  ? prochlorperazine (COMPAZINE) 10 MG tablet Take 1 tablet (10 mg total) by mouth every 6 (six) hours as needed for nausea or vomiting. 30 tablet 0  ? promethazine (PHENERGAN) 12.5 MG tablet Take 1 tablet (12.5 mg total) by mouth every 6 (six) hours as needed for nausea or vomiting. 30 tablet 2  ? ranolazine (RANEXA) 1000 MG SR tablet Take 1 tablet (1,000 mg total) by mouth 2 (two) times daily. 180 tablet 3  ? rosuvastatin (CRESTOR) 20 MG tablet Take 1 tablet (20 mg total) by mouth at bedtime.    ? sodium chloride (OCEAN) 0.65 % SOLN nasal spray Place 1 spray into both nostrils as needed for congestion.    ? spironolactone (ALDACTONE) 25 MG tablet Take 1 tablet (25 mg total) by mouth at bedtime. TAKE 1 TABLET BY MOUTH EVERYDAY AT BEDTIME    ? sucralfate (CARAFATE) 1 g tablet Take 1 tablet (1 g total) by mouth 4 (four) times daily -  with meals and at bedtime. Crush and dissolve in 10 mL of warm water prior to swallowing 120 tablet 1  ? torsemide (DEMADEX) 20 MG tablet Take 3 tablets (60 mg total) by mouth 2 (two) times daily. 540  tablet 3  ? triamcinolone cream (KENALOG) 0.5 % Apply 1 application topically 3 (three) times daily as needed (rash). To affected areas    ? Vericiguat 10 MG TABS Take 10 mg by mouth daily.    ? ?No c

## 2022-01-27 NOTE — Progress Notes (Incomplete)
?  Radiation Oncology         [phone] ?________________________________ ? ?Patient Name: Diane [name] ?MRN: [id number] ?DOB: 09-05-1956 ?Referring Physician: Curt Bears (Profile Not Attached) ?Date of Service: 12/26/2021 ?[facility], [place] ? ?                                                      End Of Treatment Note ? ?Diagnoses: C34.11-Malignant neoplasm of upper lobe, right bronchus or lung ? ?Cancer Staging: Small cell carcinoma of the RUL, with lymph node metastasis, limited stage ?  ? ?  Cancer Staging  ?Malignant neoplasm of unspecified part of unspecified bronchus or lung ([place]) ?Staging form: Lung, AJCC 8th Edition ?- Clinical: Stage IIIB (cT3, cN2, cM0) - Signed by Curt Bears, MD on 11/06/2021 ? ?Primary small cell carcinoma of right lung ([place]) ?Staging form: Lung, AJCC 8th Edition ?- Clinical: Stage IIIB (cT3, cN2, cM0) - Signed by Curt Bears, MD on 11/06/2021 ? ? ?Intent: Curative ? ?Radiation Treatment Dates: 11/15/2021 through 12/26/2021 ?Site Technique Total Dose (Gy) Dose per Fx (Gy) Completed Fx Beam Energies  ?Lung, Right: Lung_R 3D 60/60 2 30/30 6X  ? ?Narrative: The patient tolerated radiation therapy relatively well. On the date of her final treatment, the patient endorsed hemoptysis, pain/difficulty swallowing, feeling dizzy at times.  ? ?Patient also exhibited signs of orthostatic hypotension on the date of her final treatment, and was given a half a liter of fluids.  She also received additional fluids late last week and underwent CBC with differential today as well as BMP.  She also exhibited esophageal Titus for which she was given Carafate, and she was placed on viscous Xylocaine prescription sent to the [facility] to expedite use when she completes IV fluid supplementation.  ? ?Plan: The patient will follow-up with radiation oncology in one month  . ? ?________________________________________________ ?----------------------------------- ? ?Blair Promise, PhD, MD ? ?This document serves as a record of services personally performed by Gery Pray, MD. It was created on his behalf by Roney Mans, a trained medical scribe. The creation of this record is based on the scribe's personal observations and the provider's statements to them. This document has been checked and approved by the attending provider. ? ?

## 2022-01-28 DIAGNOSIS — J449 Chronic obstructive pulmonary disease, unspecified: Secondary | ICD-10-CM | POA: Diagnosis not present

## 2022-01-28 DIAGNOSIS — I5042 Chronic combined systolic (congestive) and diastolic (congestive) heart failure: Secondary | ICD-10-CM | POA: Diagnosis not present

## 2022-01-28 DIAGNOSIS — E119 Type 2 diabetes mellitus without complications: Secondary | ICD-10-CM | POA: Diagnosis not present

## 2022-01-28 NOTE — Progress Notes (Signed)
?Radiation Oncology         ([phone]) [phone] ?________________________________ ? ?Name: Diane [name] MRN: [id number]  ?Date: 01/29/2022  DOB: [birth year] ? ?Follow-Up Visit Note ? ?CC: Tower, [name], MD  Curt Bears, MD ? ?  ICD-10-CM   ?1. 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  ?2. Malignant neoplasm of unspecified part of unspecified bronchus or lung (HCC)  C34.90   ?  ? ? ?Diagnosis: The primary encounter diagnosis was Malignant neoplasm of upper lobe of right lung ([place]). A diagnosis of Malignant neoplasm of unspecified part of unspecified bronchus or lung ([place]) was also pertinent to this visit. ?  ?Small cell carcinoma of the RUL, with lymph node metastasis, limited stage ? ?Interval Since Last Radiation: 1 month and 3 days  ? ?Intent: Curative ? ?Radiation Treatment Dates: 11/15/2021 through 12/26/2021 ?Site Technique Total Dose (Gy) Dose per Fx (Gy) Completed Fx Beam Energies  ?Lung, Right: Lung_R 3D 60/60 2 30/30 6X  ? ? ?Narrative:  The patient returns today for routine follow-up.  The patient tolerated radiation therapy relatively well. On the date of her final treatment, the patient endorsed hemoptysis, pain/difficulty swallowing, feeling dizzy at times. Patient also exhibited signs of orthostatic hypotension on the date of her final treatment, and was given a half a liter of fluids.  She also received additional fluids late last week and underwent CBC with differential today as well as BMP. She also exhibited esophageal Titus for which she was given Carafate, and she was placed on viscous Xylocaine prescription sent to the [facility] [place] to expedite use when she completes IV fluid supplementation.                       ? ?Since she was last seen for consultation on 11/01/21, the patient began chemotherapy consisting of carboplatin and etoposide concurrent with RT on 11/06/21; s/p 3 cycles. She tolerated her treatment well with no concerning adverse effects except for fatigue  and radiation-induced esophagitis with dysphagia and odynophagia.  ? ?The patient also presented to the ED on 01/02/22 with weakness x 2 weeks (presented for weekly IV fluids and was instructed to present to the ED). The patient was also noted to report loss of appetite resulting in a 20 pound weight loss since completing radiation 2 weeks prior. Ultimately, the patient was treated for dehydration with IV fluids with resolution of symptoms.  ? ?Her most recent chest CT on 01/18/22 showed a marked interval response to therapy, other than persistence of some fullness of the right hilar nodal tissue and a tiny nodule at the periphery of the right upper lobe. CT also showed recent fractures of the right sided 4th and 5th ribs, a stable 4.2 cm caliber of the ascending thoracic aorta, stable intra and extrahepatic biliary duct dilation (following cholecystectomy), and an left adrenal adenoma with a background of adrenal hyperplasia.   ? ?She continues to have difficulty with swallowing solid foods.  She is eating primarily soft foods at this time.  She does have a history of esophageal stricture and underwent dilation approximately 10 years ago.  She is scheduled to see Dr. Mliss Sax assistant later this week.  She continues to use the viscous Xylocaine which is most helpful.  She reports not tolerating Carafate or hydrocodone well. ? ?Allergies:  is allergic to bee venom, benadryl [diphenhydramine], entresto [sacubitril-valsartan], farxiga [dapagliflozin], tetracycline, canagliflozin, morphine sulfate er, sitagliptin, and ace inhibitors. ? ?  Meds: ?Current Outpatient Medications  ?Medication Sig Dispense Refill  ? acetaminophen (TYLENOL) 500 MG tablet Take 1,000 mg by mouth at bedtime.    ? albuterol (PROVENTIL) (2.5 MG/3ML) 0.083% nebulizer solution Take 3 mLs (2.5 mg total) by nebulization every 6 (six) hours as needed for wheezing or shortness of breath. 1180 mL 3  ? albuterol (VENTOLIN HFA) 108 (90 Base) MCG/ACT  inhaler Inhale 2 puffs into the lungs every 6 (six) hours as needed for wheezing or shortness of breath. 24 g 3  ? aspirin EC 81 MG tablet Take 81 mg by mouth every evening.  30 tablet 6  ? clopidogrel (PLAVIX) 75 MG tablet Take 1 tablet (75 mg total) by mouth daily. 90 tablet 3  ? dexlansoprazole (DEXILANT) 60 MG capsule TAKE 1 CAPSULE DAILY 90 capsule 1  ? digoxin (LANOXIN) 0.125 MG tablet Take 0.5 tablets (0.0625 mg total) by mouth daily. 45 tablet 3  ? ezetimibe (ZETIA) 10 MG tablet Take 1 tablet (10 mg total) by mouth daily. 90 tablet 3  ? fluticasone (FLONASE) 50 MCG/ACT nasal spray Place 2 sprays into both nostrils daily. (Patient taking differently: Place 2 sprays into both nostrils daily as needed for allergies.) 48 g 3  ? gabapentin (NEURONTIN) 300 MG capsule TAKE 2 CAPSULES DAILY AT   BEDTIME 180 capsule 1  ? glucose blood (ONETOUCH ULTRA) test strip 3 (three) times daily.    ? guaiFENesin (MUCINEX) 600 MG 12 hr tablet Take 2 tablets (1,200 mg total) by mouth 2 (two) times daily. 360 tablet 3  ? HYDROcodone-acetaminophen (NORCO) 5-325 MG tablet Take 1 tablet by mouth every 6 (six) hours as needed for moderate pain. 30 tablet 0  ? icosapent Ethyl (VASCEPA) 1 g capsule Take 2 capsules (2 g total) by mouth 2 (two) times daily. 360 capsule 3  ? insulin aspart protamine- aspart (NOVOLOG MIX 70/30) (70-30) 100 UNIT/ML injection Inject 5-65 Units into the skin See admin instructions. 65 units in the Am with breakfast and 5 units with lunch and 55 units with dinner    ? levETIRAcetam (KEPPRA) 1000 MG tablet Take 1 tablet (1,000 mg total) by mouth 2 (two) times daily.    ? metFORMIN (GLUCOPHAGE-XR) 500 MG 24 hr tablet Take 500 mg by mouth every evening.   6  ? metolazone (ZAROXOLYN) 2.5 MG tablet Take 1 tablet (2.5 mg total) by mouth 2 (two) times a week. Every Mon and Fri 30 tablet 3  ? nitroGLYCERIN (NITROLINGUAL) 0.4 MG/SPRAY spray Place 1 spray under the tongue every 5 (five) minutes x 3 doses as needed for  chest pain. 12 g 3  ? ONE TOUCH ULTRA TEST test strip USE AS DIRECTED FOR TESTING BLOOD GLUCOSE 3 TIMES DAILY  1  ? polyethylene glycol (MIRALAX / GLYCOLAX) 17 g packet Take 17 g by mouth daily.    ? potassium chloride SA (KLOR-CON M20) 20 MEQ tablet Take 2 tablets (40 mEq total) by mouth every morning AND 1 tablet (20 mEq total) every evening. 270 tablet 3  ? ranolazine (RANEXA) 1000 MG SR tablet Take 1 tablet (1,000 mg total) by mouth 2 (two) times daily. 180 tablet 3  ? rosuvastatin (CRESTOR) 20 MG tablet Take 1 tablet (20 mg total) by mouth at bedtime.    ? sodium chloride (OCEAN) 0.65 % SOLN nasal spray Place 1 spray into both nostrils as needed for congestion.    ? spironolactone (ALDACTONE) 25 MG tablet Take 1 tablet (25 mg total) by mouth at bedtime.  TAKE 1 TABLET BY MOUTH EVERYDAY AT BEDTIME    ? torsemide (DEMADEX) 20 MG tablet Take 3 tablets (60 mg total) by mouth 2 (two) times daily. 540 tablet 3  ? triamcinolone cream (KENALOG) 0.5 % Apply 1 application topically 3 (three) times daily as needed (rash). To affected areas    ? Vericiguat 10 MG TABS Take 10 mg by mouth daily.    ? BD PEN NEEDLE NANO 2ND GEN 32G X 4 MM MISC Inject into the skin 3 (three) times daily.    ? diphenhydrAMINE (BENADRYL) 25 mg capsule Take 25 mg by mouth 2 (two) times daily as needed for itching or allergies. (Patient not taking: Reported on 01/24/2022)    ? lidocaine (XYLOCAINE) 2 % solution Use 15 mLs in the mouth or throat as directed as needed for pain. 200 mL 3  ? lidocaine-prilocaine (EMLA) cream Apply to the Port-A-Cath site 30-60 minutes before chemotherapy (Patient not taking: Reported on 01/29/2022) 30 g 0  ? nicotine polacrilex (NICORETTE) 4 MG gum Take 4 mg by mouth daily as needed for smoking cessation. (Patient not taking: Reported on 01/29/2022)    ? nystatin (MYCOSTATIN/NYSTOP) powder APPLY 1 APPLICATION        TOPICALLY 4 TIMES A DAY AS NEEDED FOR YEAST INFECTIONS (Patient not taking: Reported on 01/24/2022) 60 g 5  ?  prochlorperazine (COMPAZINE) 10 MG tablet Take 1 tablet (10 mg total) by mouth every 6 (six) hours as needed for nausea or vomiting. (Patient not taking: Reported on 01/24/2022) 30 tablet 0  ? promethaz

## 2022-01-29 ENCOUNTER — Ambulatory Visit
Admission: RE | Admit: 2022-01-29 | Discharge: 2022-01-29 | Disposition: A | Discharge status: Home / Self Care | Payer: Medicare Other | Source: Ambulatory Visit | Attending: Radiation Oncology | Admitting: Radiation Oncology

## 2022-01-29 ENCOUNTER — Inpatient Hospital Stay: Payer: Medicare Other | Attending: Internal Medicine

## 2022-01-29 ENCOUNTER — Encounter: Payer: Self-pay | Admitting: Radiation Oncology

## 2022-01-29 VITALS — BP 118/74 | HR 103 | Temp 97.6°F | Resp 18 | Ht <= 58 in | Wt 112.8 lb

## 2022-01-29 DIAGNOSIS — C3490 Malignant neoplasm of unspecified part of unspecified bronchus or lung: Secondary | ICD-10-CM | POA: Diagnosis not present

## 2022-01-29 DIAGNOSIS — C3491 Malignant neoplasm of unspecified part of right bronchus or lung: Secondary | ICD-10-CM | POA: Diagnosis not present

## 2022-01-29 NOTE — Progress Notes (Signed)
Diane [name] is here today for follow up post radiation to the lung. ? ?Lung Side: Right,patient completed treatment on 12/26/21 ? ?Does the patient complain of any of the following: ?Pain:No ?Shortness of breath w/wo exertion: Yes on exertion.  ?Cough: Yes, productive cough.  ?Hemoptysis: Yes, having 3 day episode 1 month ago ?Pain with swallowing: Yes, continues to use lidocaine as directed.  ?Swallowing/choking concerns: No, reports taking smaller bites.  ?Appetite: Good ?Wt Readings from Last 3 Encounters:  ?01/29/22 112 lb 12.8 oz (51.2 kg)  ?01/24/22 112 lb 1.6 oz (50.8 kg)  ?12/19/21 125 lb 8 oz (56.9 kg)  ?  ?Energy Level: Low  ?Post radiation skin Changes: No ? ? ? ?Additional comments if applicable: Patient to follow up with GI doctor for possible esophageal stretching on 02/02/22.  ?

## 2022-01-31 ENCOUNTER — Other Ambulatory Visit (HOSPITAL_COMMUNITY): Payer: Self-pay | Admitting: Cardiology

## 2022-01-31 DIAGNOSIS — S8262XA Displaced fracture of lateral malleolus of left fibula, initial encounter for closed fracture: Secondary | ICD-10-CM | POA: Diagnosis not present

## 2022-01-31 DIAGNOSIS — Z20822 Contact with and (suspected) exposure to covid-19: Secondary | ICD-10-CM | POA: Diagnosis not present

## 2022-02-02 ENCOUNTER — Encounter: Payer: Self-pay | Admitting: Gastroenterology

## 2022-02-02 ENCOUNTER — Telehealth: Payer: Self-pay

## 2022-02-02 ENCOUNTER — Ambulatory Visit (INDEPENDENT_AMBULATORY_CARE_PROVIDER_SITE_OTHER): Payer: Medicare Other | Admitting: Gastroenterology

## 2022-02-02 VITALS — BP 126/74 | HR 97 | Ht <= 58 in | Wt 116.0 lb

## 2022-02-02 DIAGNOSIS — Z794 Long term (current) use of insulin: Secondary | ICD-10-CM

## 2022-02-02 DIAGNOSIS — R1310 Dysphagia, unspecified: Secondary | ICD-10-CM | POA: Diagnosis not present

## 2022-02-02 DIAGNOSIS — E119 Type 2 diabetes mellitus without complications: Secondary | ICD-10-CM | POA: Diagnosis not present

## 2022-02-02 DIAGNOSIS — I255 Ischemic cardiomyopathy: Secondary | ICD-10-CM

## 2022-02-02 NOTE — Patient Instructions (Signed)
If you are age 68 or older, your body mass index should be between 23-30. Your Body mass index is 25.1 kg/m?[name] If this is out of the aforementioned range listed, please consider follow up with your Primary Care Provider. ? ?If you are age 79 or younger, your body mass index should be between 19-25. Your Body mass index is 25.1 kg/m?[name] If this is out of the aformentioned range listed, please consider follow up with your Primary Care Provider.  ? ?________________________________________________________ ? ?The [facility] providers would like to encourage you to use [facility] to communicate with providers for non-urgent requests or questions.  Due to long hold times on the telephone, sending your provider a message by [facility] may be a faster and more efficient way to get a response.  Please allow 48 business hours for a response.  Please remember that this is for non-urgent requests.  ?_______________________________________________________ ? ?You have been scheduled for an endoscopy. Please follow written instructions given to you at your visit today. ?If you use inhalers (even only as needed), please bring them with you on the day of your procedure. ? ?You will be contacted by our office prior to your procedure for directions on holding your Plavix.  If you do not hear from our office 1 week prior to your scheduled procedure, please call [phone] to discuss.  ? ?You have been scheduled for a Barium Esophogram at [facility] (1st floor of the hospital) on 02-12-2022 at 11am. Please arrive 15 minutes prior to your appointment for registration. Make certain not to have anything to eat or drink 3 hours prior to your test. If you need to reschedule for any reason, please contact radiology at [phone] to do so. ?__________________________________________________________________ ?A barium swallow is an examination that concentrates on views of the esophagus. This tends to be a double contrast exam (barium and  two liquids which, when combined, create a gas to distend the wall of the oesophagus) or single contrast (non-ionic iodine based). The study is usually tailored to your symptoms so a good history is essential. Attention is paid during the study to the form, structure and configuration of the esophagus, looking for functional disorders (such as aspiration, dysphagia, achalasia, motility and reflux) ?EXAMINATION ?You may be asked to change into a gown, depending on the type of swallow being performed. A radiologist and radiographer will perform the procedure. The radiologist will advise you of the ?type of contrast selected for your procedure and direct you during the exam. You will be asked to stand, sit or lie in several different positions and to hold a small amount of fluid in your mouth before being asked to swallow while the imaging is performed .In some instances you may be asked to swallow barium coated marshmallows to assess the motility of a solid food bolus. ?The exam can be recorded as a digital or video fluoroscopy procedure. ?POST PROCEDURE ?It will take 1-2 days for the barium to pass through your system. To facilitate this, it is important, unless otherwise directed, to increase your fluids for the next 24-48hrs and to resume your normal diet.  ?This test typically takes about 30 minutes to perform. ?__________________________________________________________________________________ ? ?Due to recent changes in healthcare laws, you may see the results of your imaging and laboratory studies on MyChart before your provider has had a chance to review them.  We understand that in some cases there may be results that are confusing or concerning to you. Not all laboratory results come back  in the same time frame and the provider may be waiting for multiple results in order to interpret others.  Please give [place] 48 hours in order for your provider to thoroughly review all the results before contacting the office  for clarification of your results.  ? ?It was a pleasure to see you today! ? ?Thank you for trusting me with your gastrointestinal care!   ?  ? ?

## 2022-02-02 NOTE — Telephone Encounter (Signed)
Patient is an advanced CHF patient and is followed by Dr. Aundra Dubin. She was last seen by Allena Katz, NP, in 08/2021 at which time she was noted to have worsening dyspnea and Torsemide was increased. She was advised to follow-up with Dr. Aundra Dubin in 3 months but does not look like this has happened. Given she is overdue for follow-up and our pre-op algorithm states advanced CHF patients need to be cleared by their primary cardiologist, I think patient warrants an in-person office visit with Dr. Claris Gladden team. ? ?Pre-op covering team, can you please help arrange this and notify requesting office? ? ?Thank you! ?Lillian Tigges ?

## 2022-02-02 NOTE — Progress Notes (Signed)
? ? ? ?02/02/2022 ?Diane [name] ?[id number] ?[birth year] ? ? ?HISTORY OF PRESENT ILLNESS: This is a 66 year old female with multiple medical problems as listed below.  She was diagnosed with lung cancer in December 2022.  Underwent chemotherapy and then more recently radiation.  Her last radiation treatment was March 28.  She has been experiencing severe pain in her esophagus when she eats and drinks.  She says that things do tend to hang up as well, but  pain is the major issue.  She is on Dexilant 60 mg daily for a long time.  They tried Carafate suspension and that did not help.  Currently she is on lidocaine, which she says helps a little bit obviously just temporarily.  Nausea as well.  Not able to eat or drink much.  Taking very small sips. ? ?EGD 01/2019: ? ?- Normal esophagus. ?- Small hiatal hernia. ?- Erythematous mucosa in the antrum. Biopsied. ?- Normal duodenal bulb and second portion of the duodenum. Biopsied.  ? ?1. Duodenum, Biopsy ?- DUODENAL MUCOSA WITH FOCAL MILD BRUNNER GLAND HYPERPLASIA. ?- NO FEATURES OF SPRUE, ACTIVE INFLAMMATION OR GRANULOMAS. ?2. Stomach, biopsy ?- ANTRAL AND OXYNTIC MUCOSA WITH CHANGES OF REACTIVE GASTROPATHY. ?- WARTHIN-STARRY STAIN NEGATIVE FOR HELICOBACTER PYLORI. ?- NO INTESTINAL METAPLASIA, DYSPLASIA OR MALIGNANCY. ? ?She is on Plavix for history of coronary artery disease that is prescribed by Dr. Aundra Dubin. ? ?Past Medical History:  ?Diagnosis Date  ? Anemia   ? Arthritis   ? Carotid stenosis   ? a. s/p right carotid stent 10/2017.  ? Chronic systolic CHF (congestive heart failure) ([place])   ? Colon polyps   ? COPD (chronic obstructive pulmonary disease) ([place])   ? Coronary artery disease   ? post CABG in 3/07 , coronary stents   ? Depression   ? after major stroke years ago, no longer treated  ? Diabetes mellitus   ? type 2  ? Dyslipidemia   ? Fatty liver   ? Fibromyalgia   ? GERD (gastroesophageal reflux disease)   ? Headache   ? hx of   ? History of kidney stones   ?  History of radiation therapy   ? right lung 11/15/2021-12/26/2021  Dr Gery Pray  ? Hyperlipidemia   ? Hypertension   ? Lung cancer ([place])   ? Myocardial infarction [facility])   ? Pneumonia   ? hx of   ? Seizures ([place])   ? last seizure- 03/2013   ? Shortness of breath dyspnea   ? with exertion or when fluid builds up   ? Sleep apnea   ? used to wear a cpap- not used in 3 years   ? Status post dilation of esophageal narrowing   ? Stroke [facility]) 1993  ? problems with balance   ? Systolic murmur   ? known mild AS and MR  ? Thyroid goiter   ? Tobacco abuse   ? ?Past Surgical History:  ?Procedure Laterality Date  ? ABDOMINAL AORTOGRAM W/LOWER EXTREMITY N/A 04/18/2020  ? Procedure: ABDOMINAL AORTOGRAM W/LOWER EXTREMITY;  Surgeon: Lorretta Harp, MD;  Location: [place] CV LAB;  Service: Cardiovascular;  Laterality: N/A;  ? APPENDECTOMY    ? BIOPSY  02/03/2019  ? Procedure: BIOPSY;  Surgeon: Ladene Artist, MD;  Location: [facility] ENDOSCOPY;  Service: Endoscopy;;  ? BREAST BIOPSY Left 2016  ? BRONCHIAL BIOPSY  09/18/2021  ? Procedure: BRONCHIAL BIOPSIES;  Surgeon: Collene Gobble, MD;  Location: [facility] ENDOSCOPY;  Service: Pulmonary;;  ?  BRONCHIAL BRUSHINGS  09/18/2021  ? Procedure: BRONCHIAL BRUSHINGS;  Surgeon: Collene Gobble, MD;  Location: [facility] ENDOSCOPY;  Service: Pulmonary;;  ? BRONCHIAL NEEDLE ASPIRATION BIOPSY  09/18/2021  ? Procedure: BRONCHIAL NEEDLE ASPIRATION BIOPSIES;  Surgeon: Collene Gobble, MD;  Location: [facility] ENDOSCOPY;  Service: Pulmonary;;  ? CARDIAC CATHETERIZATION  11/29/05  ? EF of 55%  ? CARDIAC CATHETERIZATION  08/06/06  ? EF of 45-50%  ? CARDIAC CATHETERIZATION N/A 05/06/2015  ? Procedure: Right/Left Heart Cath and Coronary/Graft Angiography;  Surgeon: Peter M Martinique, MD;  Location: [place] CV LAB;  Service: Cardiovascular;  Laterality: N/A;  ? CAROTID PTA/STENT INTERVENTION N/A 10/10/2017  ? Procedure: CAROTID PTA/STENT INTERVENTION - Right;  Surgeon: Serafina Mitchell, MD;  Location: [place] CV LAB;  Service:  Cardiovascular;  Laterality: N/A;  ? CERVICAL FUSION  1990  ? CHOLECYSTECTOMY    ? COLON RESECTION    ? mass removed and 4 in of colon  ? COLONOSCOPY WITH PROPOFOL N/A 07/16/2017  ? Procedure: COLONOSCOPY WITH PROPOFOL;  Surgeon: Ladene Artist, MD;  Location: [facility] ENDOSCOPY;  Service: Endoscopy;  Laterality: N/A;  ? CORONARY ARTERY BYPASS GRAFT  12/04/2005  ? x5 -- left internal mammary artery to the LAD, left radial artery to the ramus intermedius, saphenous vein graft to the obtuse marginal 1, sequential saphenous vein grat to the acute marginal and posterior descending, endoscopic vein harvesting from the left thigh with open vein harvest from right leg  ? CORONARY STENT PLACEMENT  08/11/06  ? PCI of her ciurcumflex/OM vessel  ? ESOPHAGOGASTRODUODENOSCOPY (EGD) WITH PROPOFOL N/A 02/03/2019  ? Procedure: ESOPHAGOGASTRODUODENOSCOPY (EGD) WITH PROPOFOL;  Surgeon: Ladene Artist, MD;  Location: [facility] ENDOSCOPY;  Service: Endoscopy;  Laterality: N/A;  ? FINE NEEDLE ASPIRATION  09/18/2021  ? Procedure: FINE NEEDLE ASPIRATION (FNA) LINEAR;  Surgeon: Collene Gobble, MD;  Location: [facility] ENDOSCOPY;  Service: Pulmonary;;  ? ICD IMPLANT N/A 07/15/2020  ? Procedure: ICD IMPLANT;  Surgeon: Vickie Epley, MD;  Location: [place] CV LAB;  Service: Cardiovascular;  Laterality: N/A;  ? IR IMAGING GUIDED PORT INSERTION  10/20/2021  ? LAPAROTOMY Bilateral 05/19/2015  ? Procedure: EXPLORATORY LAPAROTOMY WITH BILATERAL SALPINGO OOPHORECTOMY /OMENTECTOMY/SEGMENTAL SIGMOID COLECTOMY ;  Surgeon: Everitt Amber, MD;  Location: [facility] ORS;  Service: Gynecology;  Laterality: Bilateral;  ? PERIPHERAL VASCULAR BALLOON ANGIOPLASTY  04/18/2020  ? Procedure: PERIPHERAL VASCULAR BALLOON ANGIOPLASTY;  Surgeon: Lorretta Harp, MD;  Location: [place] CV LAB;  Service: Cardiovascular;;  rt SFA  ?atherectomy and DCB  ? PERIPHERAL VASCULAR CATHETERIZATION N/A 11/15/2015  ? Procedure: Carotid PTA/Stent Intervention;  Surgeon: Lorretta Harp, MD;   Location: [place] [facility] LAB;  Service: Cardiovascular;  Laterality: N/A;  ? PERIPHERAL VASCULAR CATHETERIZATION  11/15/2015  ? Procedure: Carotid Angiography;  Surgeon: Lorretta Harp, MD;  Location: [place] CV LAB;  Service: Cardiovascular;;  ? RIGHT HEART CATH N/A 01/27/2020  ? Procedure: RIGHT HEART CATH;  Surgeon: Larey Dresser, MD;  Location: [place] CV LAB;  Service: Cardiovascular;  Laterality: N/A;  ? VIDEO BRONCHOSCOPY WITH ENDOBRONCHIAL ULTRASOUND N/A 09/18/2021  ? Procedure: VIDEO BRONCHOSCOPY WITH ENDOBRONCHIAL ULTRASOUND;  Surgeon: Collene Gobble, MD;  Location: [facility] ENDOSCOPY;  Service: Pulmonary;  Laterality: N/A;  ? VIDEO BRONCHOSCOPY WITH RADIAL ENDOBRONCHIAL ULTRASOUND  09/18/2021  ? Procedure: RADIAL ENDOBRONCHIAL ULTRASOUND;  Surgeon: Collene Gobble, MD;  Location: [facility] ENDOSCOPY;  Service: Pulmonary;;  ? ? reports that she has been smoking cigarettes. She started smoking about  56 years ago. She has a 51.00 pack-year smoking history. She has never used smokeless tobacco. She reports that she does not drink alcohol and does not use drugs. ?family history includes COPD in her mother; Cancer in her father, paternal uncle, and sister; Diabetes in her mother; Drug abuse in her paternal grandmother; Glaucoma in her sister; Heart attack in her father; Heart disease in her maternal aunt, maternal grandfather, and mother; Hyperlipidemia in her mother; Hypertension in her mother and sister; Lung cancer in her paternal aunt and paternal uncle; Melanoma in her paternal uncle; Stroke in her paternal grandfather. ?Allergies  ?Allergen Reactions  ? Bee Venom Itching and Swelling  ? Benadryl [Diphenhydramine] Other (See Comments)  ?  Insomnia   ? Entresto [Sacubitril-Valsartan] Cough  ? [place] [Dapagliflozin]   ?  Yeast infection  ? Tetracycline   ?  Unknown, pt cannot recall exact reaction  ? Canagliflozin Other (See Comments)  ? Morphine Sulfate Er Other (See Comments)  ?  "Severe chest pain"-pt  will not take anymore  ? Sitagliptin Other (See Comments)  ? Ace Inhibitors Cough  ? ? ?  ?Outpatient Encounter Medications as of 02/02/2022  ?Medication Sig  ? acetaminophen (TYLENOL) 500 MG tablet Take 1,000 mg by mo

## 2022-02-02 NOTE — Telephone Encounter (Signed)
[facility] Pre-operative Risk Assessment  ?   ?Request for surgical clearance:     Endoscopy Procedure ? ?What type of surgery is being performed?     EGD ? ?When is this surgery scheduled?     03-13-2022 ? ?What type of clearance is required ?   Pharmacy ? ?Are there any medications that need to be held prior to surgery and how long? Plavix, 5 days  ? ?Practice name and name of physician performing surgery?      [facility] ? ?What is your office phone and fax number?      Phone- [phone]  Fax- [phone] ? ?Anesthesia type (None, local, MAC, general) ?       MAC  ?

## 2022-02-06 NOTE — Telephone Encounter (Signed)
Looks like she should get work-in appt with APP given worsened dyspnea last visit with no followup yet.  ?

## 2022-02-07 NOTE — Telephone Encounter (Signed)
Called pt to schedule appointment no answer/left vm for return.  ?

## 2022-02-08 NOTE — Progress Notes (Signed)
Remote ICD transmission.   

## 2022-02-12 ENCOUNTER — Ambulatory Visit (HOSPITAL_COMMUNITY)
Admission: RE | Admit: 2022-02-12 | Discharge: 2022-02-12 | Disposition: A | Discharge status: Home / Self Care | Payer: Medicare Other | Source: Ambulatory Visit | Attending: Gastroenterology | Admitting: Gastroenterology

## 2022-02-12 ENCOUNTER — Telehealth: Payer: Self-pay | Admitting: Gastroenterology

## 2022-02-12 DIAGNOSIS — R1310 Dysphagia, unspecified: Secondary | ICD-10-CM | POA: Insufficient documentation

## 2022-02-12 DIAGNOSIS — K219 Gastro-esophageal reflux disease without esophagitis: Secondary | ICD-10-CM | POA: Diagnosis not present

## 2022-02-12 NOTE — Telephone Encounter (Signed)
Diane from [facility] called in to report that patient's results from her DG Esophagus is ready to review. ?

## 2022-02-12 NOTE — Telephone Encounter (Signed)
Diane [name]  ?

## 2022-02-14 NOTE — Telephone Encounter (Signed)
Appt scheduled

## 2022-02-16 ENCOUNTER — Encounter: Payer: Self-pay | Admitting: Physician Assistant

## 2022-02-16 ENCOUNTER — Encounter: Payer: Self-pay | Admitting: Certified Registered Nurse Anesthetist

## 2022-02-19 ENCOUNTER — Encounter: Payer: Self-pay | Admitting: Gastroenterology

## 2022-02-19 ENCOUNTER — Ambulatory Visit (AMBULATORY_SURGERY_CENTER): Payer: Medicare Other | Admitting: Gastroenterology

## 2022-02-19 VITALS — BP 109/69 | HR 96 | Temp 97.5°F | Resp 18 | Ht <= 58 in | Wt 116.0 lb

## 2022-02-19 DIAGNOSIS — K209 Esophagitis, unspecified without bleeding: Secondary | ICD-10-CM | POA: Diagnosis not present

## 2022-02-19 DIAGNOSIS — K319 Disease of stomach and duodenum, unspecified: Secondary | ICD-10-CM | POA: Diagnosis not present

## 2022-02-19 DIAGNOSIS — K449 Diaphragmatic hernia without obstruction or gangrene: Secondary | ICD-10-CM

## 2022-02-19 DIAGNOSIS — K2970 Gastritis, unspecified, without bleeding: Secondary | ICD-10-CM | POA: Diagnosis not present

## 2022-02-19 DIAGNOSIS — K2210 Ulcer of esophagus without bleeding: Secondary | ICD-10-CM | POA: Diagnosis not present

## 2022-02-19 DIAGNOSIS — K222 Esophageal obstruction: Secondary | ICD-10-CM

## 2022-02-19 DIAGNOSIS — K269 Duodenal ulcer, unspecified as acute or chronic, without hemorrhage or perforation: Secondary | ICD-10-CM | POA: Diagnosis not present

## 2022-02-19 DIAGNOSIS — I2510 Atherosclerotic heart disease of native coronary artery without angina pectoris: Secondary | ICD-10-CM | POA: Diagnosis not present

## 2022-02-19 DIAGNOSIS — R1310 Dysphagia, unspecified: Secondary | ICD-10-CM

## 2022-02-19 DIAGNOSIS — I509 Heart failure, unspecified: Secondary | ICD-10-CM | POA: Diagnosis not present

## 2022-02-19 MED ORDER — SODIUM CHLORIDE 0.9 % IV SOLN: 500.0000 mL | Freq: Once | INTRAVENOUS | Status: DC

## 2022-02-19 NOTE — Progress Notes (Signed)
Pt's states no medical or surgical changes since previsit or office visit. 

## 2022-02-19 NOTE — Progress Notes (Signed)
Porta Cath on right upper chest wall. Pacemaker and Defribralator on left. Continue glucose monitor on left.

## 2022-02-19 NOTE — Progress Notes (Signed)
1543 HR > 100 with esmolol 25 mg given IV, MD updated, vss

## 2022-02-19 NOTE — Progress Notes (Signed)
1534 Robinul 0.1 mg IV given due large amount of secretions upon assessment.  MD made aware, vss

## 2022-02-19 NOTE — Progress Notes (Signed)
See 02/02/2022 H&P, no changes.

## 2022-02-19 NOTE — Progress Notes (Signed)
Report given to PACU, vss 

## 2022-02-19 NOTE — Progress Notes (Signed)
Called to room to assist during endoscopic procedure.  Patient ID and intended procedure confirmed with present staff. Received instructions for my participation in the procedure from the performing physician.  

## 2022-02-19 NOTE — Op Note (Signed)
[facility] Patient Name: Diane [name] Procedure Date: 02/19/2022 3:36 PM MRN: [id number] Endoscopist: Ladene Artist , MD Age: 66 Referring MD:  Date of Birth: [birth year] Gender: Female Account #: [id number] Procedure:                Upper GI endoscopy Indications:              Dysphagia, Odynophagia Medicines:                Monitored Anesthesia Care Procedure:                Pre-Anesthesia Assessment:                           - Prior to the procedure, a History and Physical                            was performed, and patient medications and                            allergies were reviewed. The patient's tolerance of                            previous anesthesia was also reviewed. The risks                            and benefits of the procedure and the sedation                            options and risks were discussed with the patient.                            All questions were answered, and informed consent                            was obtained. Prior Anticoagulants: The patient has                            taken Plavix (clopidogrel), last dose was 5 days                            prior to procedure. ASA Grade Assessment: III - A                            patient with severe systemic disease. After                            reviewing the risks and benefits, the patient was                            deemed in satisfactory condition to undergo the                            procedure.  After obtaining informed consent, the endoscope was                            passed under direct vision. Throughout the                            procedure, the patient's blood pressure, pulse, and                            oxygen saturations were monitored continuously. The                            GIF [id number] #[id number] was introduced through the                            mouth, and advanced to the second part of duodenum.                             The upper GI endoscopy was accomplished without                            difficulty. The patient tolerated the procedure                            well. Scope In: Scope Out: Findings:                 One benign-appearing, intrinsic moderate stenosis                            was found 25 cm from the incisors with associated                            mild inflammation. This stenosis measured 1.2 cm                            (inner diameter) x less than one cm (in length).                            The stenosis was traversed. Biopsies were taken                            with a cold forceps for histology. A guidewire was                            placed and the scope was withdrawn. Dilations were                            performed with Savary dilators with mild resistance                            at 13 mm, 14 mm and 15 mm. Heme on all dilators.  The exam of the esophagus was otherwise normal.                           Diffuse moderate inflammation characterized by                            congestion (edema), friability and granularity was                            found in the entire examined stomach. Biopsies were                            taken with a cold forceps for histology.                           A small hiatal hernia was present.                           The exam of the stomach was otherwise normal.                           Multiple diffuse erosions without bleeding were                            found in the duodenal bulb.                           The exam of the duodenum was otherwise normal. Complications:            No immediate complications. Estimated Blood Loss:     Estimated blood loss was minimal. Impression:               - Benign-appearing esophageal stenosis. Biopsied.                            Dilated.                           - Gastritis. Biopsied.                           - Small hiatal hernia.                            - Duodenal erosions without bleeding. Recommendation:           - Patient has a contact number available for                            emergencies. The signs and symptoms of potential                            delayed complications were discussed with the                            patient. Return to normal activities tomorrow.  Written discharge instructions were provided to the                            patient.                           - Clear liquid diet for 2 hours, then advance as                            tolerated to full liquid and then soft diet today.                           - Maintain full liquid to soft diet until                            dysphagia, odynophagia improves.                           - Continue present medications.                           - Await pathology results.                           - Return to GI office in 1 month.                           - Resume Plavix (clopidogrel) at prior dose in 2                            days. Refer to managing physician for further                            adjustment of therapy. Ladene Artist, MD 02/19/2022 4:00:11 PM This report has been signed electronically.

## 2022-02-19 NOTE — Patient Instructions (Addendum)
Resume  your plavix in 2 days at the prior dose.  YOU HAD AN ENDOSCOPIC PROCEDURE TODAY AT [facility]:   Refer to the procedure report that was given to you for any specific questions about what was found during the examination.  If the procedure report does not answer your questions, please call your gastroenterologist to clarify.  If you requested that your care partner not be given the details of your procedure findings, then the procedure report has been included in a sealed envelope for you to review at your convenience later.  YOU SHOULD EXPECT: Some feelings of bloating in the abdomen. Passage of more gas than usual.  Walking can help get rid of the air that was put into your GI tract during the procedure and reduce the bloating. If you had a lower endoscopy (such as a colonoscopy or flexible sigmoidoscopy) you may notice spotting of blood in your stool or on the toilet paper. If you underwent a bowel prep for your procedure, you may not have a normal bowel movement for a few days.  Please Note:  You might notice some irritation and congestion in your nose or some drainage.  This is from the oxygen used during your procedure.  There is no need for concern and it should clear up in a day or so.  SYMPTOMS TO REPORT IMMEDIATELY:   Following upper endoscopy (EGD)  Vomiting of blood or coffee ground material  New chest pain or pain under the shoulder blades  Painful or persistently difficult swallowing  New shortness of breath  Fever of 100F or higher  Black, tarry-looking stools  For urgent or emergent issues, a gastroenterologist can be reached at any hour by calling [phone]. Do not use MyChart messaging for urgent concerns.    DIET:  We do recommend clear liquids until 6 pm.  Regular liquids. Then, a soft diet until your swallowing difficulties are gone. .  Drink plenty of fluids but you should avoid alcoholic beverages for 24 hours.  ACTIVITY:  You should plan  to take it easy for the rest of today and you should NOT DRIVE or use heavy machinery until tomorrow (because of the sedation medicines used during the test).    FOLLOW UP: Our staff will call the number listed on your records 48-72 hours following your procedure to check on you and address any questions or concerns that you may have regarding the information given to you following your procedure. If we do not reach you, we will leave a message.  We will attempt to reach you two times.  During this call, we will ask if you have developed any symptoms of COVID 19. If you develop any symptoms (ie: fever, flu-like symptoms, shortness of breath, cough etc.) before then, please call [phone].  If you test positive for Covid 19 in the 2 weeks post procedure, please call and report this information to [place].    If any biopsies were taken you will be contacted by phone or by letter within the next 1-3 weeks.  Please call us at [phone] if you have not heard about the biopsies in 3 weeks.    SIGNATURES/CONFIDENTIALITY: You and/or your care partner have signed paperwork which will be entered into your electronic medical record.  These signatures attest to the fact that that the information above on your After Visit Summary has been reviewed and is understood.  Full responsibility of the confidentiality of this discharge information lies with you  and/or your care-partner.

## 2022-02-20 ENCOUNTER — Telehealth: Payer: Self-pay

## 2022-02-20 NOTE — Telephone Encounter (Signed)
Called [phone] and left a message we tried to reach pt for a follow up call. maw

## 2022-02-20 NOTE — Telephone Encounter (Signed)
No answer, left message to call if having any issues or concerns, B.Allahna Husband RN 

## 2022-02-21 ENCOUNTER — Other Ambulatory Visit (HOSPITAL_COMMUNITY): Payer: Self-pay | Admitting: Cardiology

## 2022-02-21 ENCOUNTER — Other Ambulatory Visit: Payer: Self-pay | Admitting: Neurology

## 2022-02-21 DIAGNOSIS — G40009 Localization-related (focal) (partial) idiopathic epilepsy and epileptic syndromes with seizures of localized onset, not intractable, without status epilepticus: Secondary | ICD-10-CM

## 2022-02-26 ENCOUNTER — Encounter: Payer: Self-pay | Admitting: Gastroenterology

## 2022-02-28 DIAGNOSIS — I5042 Chronic combined systolic (congestive) and diastolic (congestive) heart failure: Secondary | ICD-10-CM | POA: Diagnosis not present

## 2022-02-28 DIAGNOSIS — E119 Type 2 diabetes mellitus without complications: Secondary | ICD-10-CM | POA: Diagnosis not present

## 2022-02-28 DIAGNOSIS — J449 Chronic obstructive pulmonary disease, unspecified: Secondary | ICD-10-CM | POA: Diagnosis not present

## 2022-03-05 NOTE — Progress Notes (Unsigned)
NEUROLOGY FOLLOW UP OFFICE NOTE  Diane [name] [id number]  Assessment/Plan:    Symptomatic focal seizure disorder with impaired consciousness, stable  Cervical radiculopathy, stable   1. Seizure prophylaxis:  Keppra 1050m BID filled 2. Neuropathic pain:  gabapentin 6038mQHS filled 3. Follow up one year   Subjective:  Diane Guarnieris a [age]ear old right-handed woman with CAD s/p CABG, hypertension, hyperlipidemia, type 2 diabetes mellitus, tobacco use, depression, fibromyalgia, OSA, COPD and history of stroke who follows up for seizure disorder.   UPDATE: Current medications:  Keppra 100077mwice daily, gabapentin 600m14m bedtime (for neck pain)   No spells.  Last spell 03/18/13.   Her main issue is the chronic dizziness.  She feels unsure of herself walking without the walker.  2 months ago, [facility] felt light[facility] and fell.  Blood sugar was okay.  She was not able to check her blood pressure.     HISTORY: 1.  Seizure disorder: [facility] 03/[facility]13 for recurrent seizure-like episodes.  These spells are were witnessed by Dr. CharWallie Charuro-hospitalist, and described as "head turning to the right as well as eye deviation to the right, stiffening of right extremities with flexion of right upper extremity, and convulsive movements of right extremities. Spell lasted about 40-50 seconds and resolved without intervention. Within 30 seconds patient was again awake and conversant. She was slightly confused with respect to time."  She is unable to speak during these events.  Sometimes she was aware during these events and other times she was unaware and amnestic.  She did have urinary incontinence but no tongue biting.  It is reported that she can write down what she wants to say during these spells.  Given her stroke history, she was evaluated for possible left hemispheric stroke.  By his assessment, it seemed that  her speech difficulties were non-organic, seizure-like activity non-organic, and he did not suspect acute stroke.     She had continuous EEG monitoring, which captured multiple spells without electrographic correlate.  However, she did have one captured clinical event, described as "build up to rhythmical theta activity and resultant slowing companied by clinical changes.  This may very well represent epileptiform activity.  No interictal abnormalities were noted".  Psychiatry was consulted, regarding possible pseudoseizures.  Despite the possible diagnosis of pseudoseizures, the neuro-hospitalists never made a sole diagnosis of pseudoseizures in the hospital notes.     I personally reviewed the EEG.  At 1:57:48, patient pauses, head slightly turned left, staring, with little movement and no convulsions.  She does pull at the covers a little, but no appreciable stereotypical hand movements.  When it is over, she begins moving around again.  Electrographically, she does have a build up of generalized theta activity up until 1:58:26, followed by generalized delta slowing lasting until 1:58:45.  Difficult to lateralize, but may be more prominent in the left hemisphere.  However, this appears to only be a clip of the event and the rest of the study is not available to me.  Therefore I cannot comment on the other captured spells.   She also reports episodes of tremor involving the head, hands and legs.  It is not positional.  It lasts 20 to 30 minutes.  She has had it for many years.  Blood pressure and blood sugar readings are normal.  She says her sister has Parkinson's disease.    Chronic dizziness.  MRI of brain performed on 03/03/13, which reported possible tiny acute infarct in the posterior left frontal lobe, but neurology believed this to be artifact.  There was also mention of incidental tiny cysts in right hippocampal region.  Encephalomalacia in the right frontoparietal region, from prior remote  stroke, is seen.  MRI of brain without contrast from 12/08/15 showed chronic right MCA infarct but also showed chronic looking lacunar infarcts in the bilateral basal ganglia and left thalamus, which are new compared to prior imaging from June 2014.   Prior AEDs:  Dilantin ER $RemoveBef'100mg'ATykUYMPIE$    2.  Cervical radiculopathy: She has history of right sided neck pain radiating down to the right shoulder, associated with tingling from the right ear down to the shoulder.  Hot and cold compresses help too.  No numbness, pain or weakness down the arm.  Pain worse when turning neck to the left.  She has trigger finger and numbness in the fingers when driving or picking up objects, thought to be carpal tunnel syndrome.  She has history of cervical fusion in the 1980s.  PT was ineffective.  [name]  MRI of the cervical spine performed on 05/23/14 showed moderately large foraminal disc protrusion and associated osteophyte at C3-4, causing right foraminal encroachment.  Solid fusion C5 through C7 noted.  She was referred for right C4 nerve root injection, however it was not done.    PAST MEDICAL HISTORY: Past Medical History:  Diagnosis Date   Anemia    Arthritis    Carotid stenosis    a. s/p right carotid stent 10/2017.   Chronic systolic CHF (congestive heart failure) (HCC)    Colon polyps    COPD (chronic obstructive pulmonary disease) (HCC)    Coronary artery disease    post CABG in 3/07 , coronary stents    Depression    after major stroke years ago, no longer treated   Diabetes mellitus    type 2   Dyslipidemia    Fatty liver    Fibromyalgia    GERD (gastroesophageal reflux disease)    Headache    hx of    History of kidney stones    History of radiation therapy    right lung 11/15/2021-12/26/2021  Dr Gery Pray   Hyperlipidemia    Hypertension    Lung cancer [facility])    Myocardial infarction ([place])    Pneumonia    hx of    Seizures ([place])    last seizure- 03/2013    Shortness of breath dyspnea    with  exertion or when fluid builds up    Sleep apnea    used to wear a cpap- not used in 3 years    Status post dilation of esophageal narrowing    Stroke [facility]) 1993   problems with balance    Systolic murmur    known mild AS and MR   Thyroid goiter    Tobacco abuse     MEDICATIONS: Current Outpatient Medications on File Prior to Visit  Medication Sig Dispense Refill   acetaminophen (TYLENOL) 500 MG tablet Take 1,000 mg by mouth at bedtime.     albuterol (PROVENTIL) (2.5 MG/3ML) 0.083% nebulizer solution Take 3 mLs (2.5 mg total) by nebulization every 6 (six) hours as needed for wheezing or shortness of breath. 1180 mL 3   albuterol (VENTOLIN HFA) 108 (90 Base) MCG/ACT inhaler Inhale 2 puffs into the lungs every 6 (six) hours as needed for wheezing or shortness of breath. 24 g 3  aspirin EC 81 MG tablet Take 81 mg by mouth every evening.  30 tablet 6   BD PEN NEEDLE NANO 2ND GEN 32G X 4 MM MISC Inject into the skin 3 (three) times daily.     clopidogrel (PLAVIX) 75 MG tablet Take 1 tablet (75 mg total) by mouth daily. 90 tablet 3   dexlansoprazole (DEXILANT) 60 MG capsule TAKE 1 CAPSULE DAILY 90 capsule 1   digoxin (LANOXIN) 0.125 MG tablet Take 0.5 tablets (0.0625 mg total) by mouth daily. 45 tablet 3   diphenhydrAMINE (BENADRYL) 25 mg capsule Take 25 mg by mouth 2 (two) times daily as needed for itching or allergies.     ezetimibe (ZETIA) 10 MG tablet Take 1 tablet (10 mg total) by mouth daily. PLEASE CALL OFFICE FOR FOLLOW UP APPOINTMENT 90 tablet 3   fluticasone (FLONASE) 50 MCG/ACT nasal spray Place 2 sprays into both nostrils daily. (Patient taking differently: Place 2 sprays into both nostrils daily as needed for allergies.) 48 g 3   gabapentin (NEURONTIN) 300 MG capsule TAKE 2 CAPSULES DAILY AT   BEDTIME 180 capsule 1   glucose blood (ONETOUCH ULTRA) test strip 3 (three) times daily.     guaiFENesin (MUCINEX) 600 MG 12 hr tablet Take 2 tablets (1,200 mg total) by mouth 2 (two) times  daily. 360 tablet 3   HYDROcodone-acetaminophen (NORCO) 5-325 MG tablet Take 1 tablet by mouth every 6 (six) hours as needed for moderate pain. 30 tablet 0   icosapent Ethyl (VASCEPA) 1 g capsule Take 2 capsules (2 g total) by mouth 2 (two) times daily. 360 capsule 3   insulin aspart protamine- aspart (NOVOLOG MIX 70/30) (70-30) 100 UNIT/ML injection Inject 5-65 Units into the skin See admin instructions. 65 units in the Am with breakfast and 5 units with lunch and 55 units with dinner     levETIRAcetam (KEPPRA) 1000 MG tablet TAKE 1 TABLET TWICE A DAY 60 tablet 0   lidocaine (XYLOCAINE) 2 % solution Use 15 mLs in the mouth or throat as directed as needed for pain. 200 mL 3   lidocaine-prilocaine (EMLA) cream Apply to the Port-A-Cath site 30-60 minutes before chemotherapy 30 g 0   metFORMIN (GLUCOPHAGE-XR) 500 MG 24 hr tablet Take 500 mg by mouth every evening.   6   metolazone (ZAROXOLYN) 2.5 MG tablet TAKE 1 TABLET TWICE WEEKLY,EVERY MONDAY AND FRIDAY    (CHANGE IN DOSAGE). 26 tablet 3   nicotine polacrilex (NICORETTE) 4 MG gum Take 4 mg by mouth daily as needed for smoking cessation.     nitroGLYCERIN (NITROLINGUAL) 0.4 MG/SPRAY spray Place 1 spray under the tongue every 5 (five) minutes x 3 doses as needed for chest pain. 12 g 3   NOVOLOG MIX 70/30 FLEXPEN (70-30) 100 UNIT/ML FlexPen Inject into the skin.     nystatin (MYCOSTATIN/NYSTOP) powder APPLY 1 APPLICATION        TOPICALLY 4 TIMES A DAY AS NEEDED FOR YEAST INFECTIONS 60 g 5   ONE TOUCH ULTRA TEST test strip USE AS DIRECTED FOR TESTING BLOOD GLUCOSE 3 TIMES DAILY  1   polyethylene glycol (MIRALAX / GLYCOLAX) 17 g packet Take 17 g by mouth daily.     potassium chloride SA (KLOR-CON M20) 20 MEQ tablet Take 2 tablets (40 mEq total) by mouth every morning AND 1 tablet (20 mEq total) every evening. 270 tablet 3   prochlorperazine (COMPAZINE) 10 MG tablet Take 1 tablet (10 mg total) by mouth every 6 (six) hours as  needed for nausea or vomiting.  30 tablet 0   promethazine (PHENERGAN) 12.5 MG tablet Take 1 tablet (12.5 mg total) by mouth every 6 (six) hours as needed for nausea or vomiting. 30 tablet 2   ranolazine (RANEXA) 1000 MG SR tablet Take 1 tablet (1,000 mg total) by mouth 2 (two) times daily. 180 tablet 3   rosuvastatin (CRESTOR) 20 MG tablet Take 1 tablet (20 mg total) by mouth daily. PLEASE CALL OFFICE FOR FOLLOW UP APPOINTMENT 90 tablet 3   sodium chloride (OCEAN) 0.65 % SOLN nasal spray Place 1 spray into both nostrils as needed for congestion.     spironolactone (ALDACTONE) 25 MG tablet Take 1 tablet (25 mg total) by mouth at bedtime. PLEASE CALL OFFICE FOR FOLLOW UP APPOINTMENT 90 tablet 3   sucralfate (CARAFATE) 1 g tablet Take 1 tablet (1 g total) by mouth 4 (four) times daily -  with meals and at bedtime. Crush and dissolve in 10 mL of warm water prior to swallowing 120 tablet 1   torsemide (DEMADEX) 20 MG tablet Take 3 tablets (60 mg total) by mouth 2 (two) times daily. 540 tablet 3   triamcinolone cream (KENALOG) 0.5 % Apply 1 application topically 3 (three) times daily as needed (rash). To affected areas     VERQUVO 10 MG TABS TAKE 1 TABLET ONCE DAILY 90 tablet 3   No current facility-administered medications on file prior to visit.    ALLERGIES: Allergies  Allergen Reactions   Bee Venom Itching and Swelling   Benadryl [Diphenhydramine] Other (See Comments)    Insomnia    Entresto [Sacubitril-Valsartan] Cough   Farxiga [Dapagliflozin]     Yeast infection   Tetracycline     Unknown, pt cannot recall exact reaction   Canagliflozin Other (See Comments)   Morphine Sulfate Er Other (See Comments)    "Severe chest pain"-pt will not take anymore   Sitagliptin Other (See Comments)   Ace Inhibitors Cough    FAMILY HISTORY: Family History  Problem Relation Age of Onset   Heart disease Mother    Diabetes Mother    COPD Mother    Hyperlipidemia Mother    Hypertension Mother    Cancer Father        met, origin  unknown   Heart attack Father    Hypertension Sister    Cancer Sister        eyelid   Glaucoma Sister    Heart disease Maternal Grandfather    Drug abuse Paternal Grandmother    Stroke Paternal Grandfather    Heart disease Maternal Aunt        x 2 aunts   Lung cancer Paternal Aunt        lung with mets to brain   Melanoma Paternal Uncle    Lung cancer Paternal Uncle        lung/liver to brain   Cancer Paternal Uncle        cancer of unknown type   Colon cancer Neg Hx    Stomach cancer Neg Hx    Esophageal cancer Neg Hx       Objective:  *** General: No acute distress.  Patient appears ***-groomed.   Head:  Normocephalic/atraumatic Eyes:  Fundi examined but not visualized Neck: supple, no paraspinal tenderness, full range of motion Heart:  Regular rate and rhythm Lungs:  Clear to auscultation bilaterally Back: No paraspinal tenderness Neurological Exam: alert and oriented to person, place, and time.  Speech fluent and not dysarthric,  language intact.  CN II-XII intact. Bulk and tone normal, muscle strength 5/5 throughout.  Sensation to light touch intact.  Deep tendon reflexes 2+ throughout, toes downgoing.  Finger to nose testing intact.  Gait normal, Romberg negative.   Metta Clines, DO  CC: Loura Pardon, MD

## 2022-03-06 ENCOUNTER — Ambulatory Visit (INDEPENDENT_AMBULATORY_CARE_PROVIDER_SITE_OTHER): Payer: Medicare Other | Admitting: Neurology

## 2022-03-06 VITALS — BP 117/71 | HR 112 | Ht <= 58 in | Wt 118.0 lb

## 2022-03-06 DIAGNOSIS — M5010 Cervical disc disorder with radiculopathy, unspecified cervical region: Secondary | ICD-10-CM

## 2022-03-06 DIAGNOSIS — I255 Ischemic cardiomyopathy: Secondary | ICD-10-CM

## 2022-03-06 DIAGNOSIS — G40009 Localization-related (focal) (partial) idiopathic epilepsy and epileptic syndromes with seizures of localized onset, not intractable, without status epilepticus: Secondary | ICD-10-CM

## 2022-03-06 MED ORDER — LEVETIRACETAM 1000 MG PO TABS: 1000.0000 mg | ORAL_TABLET | Freq: Two times a day (BID) | ORAL | 1 refills | Status: DC

## 2022-03-06 MED ORDER — GABAPENTIN 300 MG PO CAPS: ORAL_CAPSULE | ORAL | 1 refills | Status: DC

## 2022-03-07 ENCOUNTER — Encounter: Payer: Self-pay | Admitting: Neurology

## 2022-03-07 ENCOUNTER — Ambulatory Visit (HOSPITAL_COMMUNITY)
Admission: RE | Admit: 2022-03-07 | Discharge: 2022-03-07 | Disposition: A | Discharge status: Home / Self Care | Payer: Medicare Other | Source: Ambulatory Visit | Attending: Family Medicine | Admitting: Family Medicine

## 2022-03-07 ENCOUNTER — Encounter (HOSPITAL_COMMUNITY): Payer: Self-pay

## 2022-03-07 VITALS — BP 102/62 | HR 108 | Wt 113.8 lb

## 2022-03-07 DIAGNOSIS — I6523 Occlusion and stenosis of bilateral carotid arteries: Secondary | ICD-10-CM | POA: Diagnosis not present

## 2022-03-07 DIAGNOSIS — F17210 Nicotine dependence, cigarettes, uncomplicated: Secondary | ICD-10-CM | POA: Diagnosis not present

## 2022-03-07 DIAGNOSIS — D509 Iron deficiency anemia, unspecified: Secondary | ICD-10-CM | POA: Insufficient documentation

## 2022-03-07 DIAGNOSIS — I428 Other cardiomyopathies: Secondary | ICD-10-CM | POA: Insufficient documentation

## 2022-03-07 DIAGNOSIS — I252 Old myocardial infarction: Secondary | ICD-10-CM | POA: Insufficient documentation

## 2022-03-07 DIAGNOSIS — I999 Unspecified disorder of circulatory system: Secondary | ICD-10-CM | POA: Insufficient documentation

## 2022-03-07 DIAGNOSIS — F17200 Nicotine dependence, unspecified, uncomplicated: Secondary | ICD-10-CM | POA: Diagnosis not present

## 2022-03-07 DIAGNOSIS — I6529 Occlusion and stenosis of unspecified carotid artery: Secondary | ICD-10-CM | POA: Insufficient documentation

## 2022-03-07 DIAGNOSIS — Z951 Presence of aortocoronary bypass graft: Secondary | ICD-10-CM | POA: Insufficient documentation

## 2022-03-07 DIAGNOSIS — I5022 Chronic systolic (congestive) heart failure: Secondary | ICD-10-CM | POA: Diagnosis not present

## 2022-03-07 DIAGNOSIS — I739 Peripheral vascular disease, unspecified: Secondary | ICD-10-CM | POA: Diagnosis not present

## 2022-03-07 DIAGNOSIS — Z7901 Long term (current) use of anticoagulants: Secondary | ICD-10-CM | POA: Insufficient documentation

## 2022-03-07 DIAGNOSIS — I959 Hypotension, unspecified: Secondary | ICD-10-CM | POA: Insufficient documentation

## 2022-03-07 DIAGNOSIS — Z7982 Long term (current) use of aspirin: Secondary | ICD-10-CM | POA: Diagnosis not present

## 2022-03-07 DIAGNOSIS — I25708 Atherosclerosis of coronary artery bypass graft(s), unspecified, with other forms of angina pectoris: Secondary | ICD-10-CM | POA: Diagnosis not present

## 2022-03-07 DIAGNOSIS — I5032 Chronic diastolic (congestive) heart failure: Secondary | ICD-10-CM | POA: Insufficient documentation

## 2022-03-07 DIAGNOSIS — Z79899 Other long term (current) drug therapy: Secondary | ICD-10-CM | POA: Insufficient documentation

## 2022-03-07 DIAGNOSIS — I2510 Atherosclerotic heart disease of native coronary artery without angina pectoris: Secondary | ICD-10-CM | POA: Diagnosis not present

## 2022-03-07 DIAGNOSIS — Z7902 Long term (current) use of antithrombotics/antiplatelets: Secondary | ICD-10-CM | POA: Insufficient documentation

## 2022-03-07 DIAGNOSIS — C3490 Malignant neoplasm of unspecified part of unspecified bronchus or lung: Secondary | ICD-10-CM | POA: Diagnosis not present

## 2022-03-07 LAB — BASIC METABOLIC PANEL
Anion gap: 12 (ref 5–15)
BUN: 16 mg/dL (ref 8–23)
CO2: 31 mmol/L (ref 22–32)
Calcium: 9.8 mg/dL (ref 8.9–10.3)
Chloride: 90 mmol/L — ABNORMAL LOW (ref 98–111)
Creatinine, Ser: 0.85 mg/dL (ref 0.44–1.00)
GFR, Estimated: >60 mL/min (ref >60)
Glucose, Bld: 315 mg/dL — ABNORMAL HIGH (ref 70–99)
Potassium: 4.9 mmol/L (ref 3.5–5.1)
Sodium: 133 mmol/L — ABNORMAL LOW (ref 135–145)

## 2022-03-07 LAB — BRAIN NATRIURETIC PEPTIDE: B Natriuretic Peptide: 195.1 pg/mL — ABNORMAL HIGH (ref 0.0–100.0)

## 2022-03-07 LAB — DIGOXIN LEVEL: Digoxin Level: 0.5 ng/mL — ABNORMAL LOW (ref 0.8–2.0)

## 2022-03-07 MED ORDER — POTASSIUM CHLORIDE ER 10 MEQ PO TBCR: EXTENDED_RELEASE_TABLET | ORAL | 3 refills | Status: DC

## 2022-03-07 NOTE — Patient Instructions (Signed)
It was great to see you today! ?No medication changes are needed at this time. ? ? ?Labs today ?We will only contact you if something comes back abnormal or we need to make some changes. ?Otherwise no news is good news! ? ? ?Your physician recommends that you schedule a follow-up appointment in: 3 months with Dr. McLean ? ?Do the following things EVERYDAY: ?Weigh yourself in the morning before breakfast. Write it down and keep it in a log. ?Take your medicines as prescribed ?Eat low salt foods--Limit salt (sodium) to 2000 mg per day.  ?Stay as active as you can everyday ?Limit all fluids for the day to less than 2 liters ? ?At the [facility], you and your health needs are our priority. As part of our continuing mission to provide you with exceptional heart care, we have created designated Provider Care Teams. These Care Teams include your primary Cardiologist (physician) and Advanced Practice Providers (APPs- Physician Assistants and Nurse Practitioners) who all work together to provide you with the care you need, when you need it.  ? ?You may see any of the following providers on your designated Care Team at your next follow up: ?Dr Daniel Bensimhon ?Dr Dalton McLean ?Amy Clegg, NP ?Brittainy Simmons, PA ?Jessica Milford,NP ?Lindsay Finch, PA ?Lauren Kemp, PharmD ? ? ?Please be sure to bring in all your medications bottles to every appointment.  ? ?If you have any questions or concerns before your next appointment please send us a message through mychart or call our office at [phone].   ? ?TO LEAVE A MESSAGE FOR THE NURSE SELECT OPTION 2, PLEASE LEAVE A MESSAGE INCLUDING: ?YOUR NAME ?DATE OF BIRTH ?CALL BACK NUMBER ?REASON FOR CALL**this is important as we prioritize the call backs ? ?YOU WILL RECEIVE A CALL BACK THE SAME DAY AS LONG AS YOU CALL BEFORE 4:00 PM ? ? ?

## 2022-03-07 NOTE — Progress Notes (Signed)
ReDS Vest / Clip - 03/07/22 1500       ReDS Vest / Clip   Station Marker A    Ruler Value 25    ReDS Value Range Low volume    ReDS Actual Value 31

## 2022-03-07 NOTE — Progress Notes (Signed)
PCP: [name], MD Cardiology: Dr. Martinique HF Cardiology: Dr. Aundra Dubin  66 y.o. with history of CAD, ischemic cardiomyopathy, CVA, and valvular disease was referred by Dr. Martinique for CHF evaluation.  Patient has an extensive history of vascular disease.  She had CABG in 2007.  Repeat LHC in 2007 showed all grafts closed except LIMA-LAD.  Patient had DES to LCx/OM.  [place] 2016 showed occlusion of DES to LCx/OM with L-L and L-R collaterals. Poor targets for redo CABG.   In 2/21, echo was done showing EF 20-25% with moderate RV dysfunction, moderate-severe MR, moderate TR.  Despite smoking history, PFTs in 2019 looked relatively normal. She additionally had the placement of a carotid stent on the right in 1/19.    RHC was done in 4/21, showing near-normal filling pressures with CI 2.12 by Fick.  CPX in 5/21 showed peak VO2 13, VE/VCO2 slope 37 => moderate HF limitation.  The PFTs on the CPX were not markedly abnormal. She saw Dr. Gwenlyn Found recently, peripheral arterial dopplers in 4/21 showed significant stenosis right SFA.   We worked her up for LVAD.  She was presented at [facility] and saw Dr. Prescott Gum.  He was concerned by her small stature and small LV cavity. Concern that Heartmate 3 would not function well in her small LV, and Heartware LVAD that was thought to be more suitable for small patients has been discontinued.   We considered her for a barostimulation trial, but she does not qualify for either ANTHEM or Batwire due to carotid disease.   She is off dapagliflozin due to recurrrent GU yeast infections.  In 7/21, she had atherectomy of right SFA.  Jolley placed in 10/21.   As she was turned down for LVAD at [facility], Dr. Aundra Dubin had her seen at [facility]. She underwent workup there with echo in 11/21 showing higher EF 35%. RHC, however, showed low CI 1.8.    Echo in 5/22 showed improvement in EF, now up to 45-50% with basal-mid inferolateral and anterolateral akinesis, normal RV.    Underwent bronchoscopy 12/19 for pulmonary nodules, suspicious for malignancy.  Follow up 12/22, NYHA II-III symptoms, mildly volume overloaded and torsemide increased to 60 bid.  Unfortunately diagnosed with small cell lung cancer 12/22 and has been undergoing treatments with chemo and radiation.   Today she returns for HF follow up. Overall feeling relatively well. She had trouble with swallowing during radiation and lost a significant amount of weight. Swallowing has improved and she is able to eat more. She has mild dyspnea walking further distances with her rolling walker. Continues with claudication-type leg pain, had several falls in March due to leg weakness. Smoking 1/2 ppd. Denies palpitations, CP, dizziness,or edema. She chronically sleeps reclined. Appetite fair. No fever or chills. Weight at home 113 pounds. Taking all medications.   ECG (personally reviewed): ST 102 bpm  [employer] device interrogation: Heartlogic score 19, 1.1 hours daily activity, no shocks (personally reviewed).               Labs (4/21): K 3.7 => 3.4, creatinine 0.78 => 0.7, hgb 11.3 => 14.4 Labs (5/21): digoxin 0.6, K 3.5, creatinine 0.63 Labs (6/21): K 4.1, creatinine 0.7, LDL 61, TGs 247 Labs (7/21): K 5, creatinine 0.76, hgb 16.9.  Labs (10/21): K 4.6, creatinine 0.86 Labs (11/21): digoxin 0.9, LDL 45, HDL 27, K 4, creatinine 0.86 Labs (4/22): hgb 14.4, K 4.9, creatinine 0.81, digoxin 0.7 Labs (9/22): hgb 14.5, K 4.9, creatinine  0.8, LDL 38 Labs (4/23): K 4.3, creatinine 0.56   PMH: 1. CAD: CABG 2007.  - LHC in 2007 showed all grafts closed except LIMA-LAD.  Patient had DES to LCx/OM.   - [place] 2016 showed occlusion of DES to LCx/OM with L-L and L-R collaterals.  - Not candidate for redo CABG with poor targets.  2. Chronic systolic CHF: Ischemic cardiomyopathy.   - Echo (2/21): EF 20-25%, moderately dilated and moderately dysfunctional RV, PASP 49, severe biatrial enlargement,  moderate-severe MR, severe TR, mild-moderate AS, IVC dilated.  - RHC (4/21): mean RA 6, PA 41/16, mean 29, mean PCWP 15, CI 2.12, PVR 4.3 WU - CPX (5/21): peak VO2 13, VE/VCO2 slope 37, RER 1.06 => moderate HF limitation.  - Echo (11/21): EF 35%, mild LVH.  - RHC (11/21): baseline CI 1.8, exercise peak VO2 12.2, PCWP up to 24 with exercise.  - Echo (5/22): EF 45-50%, basal-mid inferolateral and anterolateral akinesis, normal RV.  3. Fe deficiency anemia: FOBT negative.  4. Carotid stenosis: Right carotid stent in 1/19.  - Carotid dopplers (7/59): 16-38% LICA stenosis.  - Carotid dopplers (4/66): 59-93% LICA stenosis.  - Carotid dopplers (5/70): 17-79% LICA stenosis.  5. H/o CVA 6. HTN 7. Type 2 diabetes 8. Hyperlipidemia 9. Seizure disorder 10. OSA: Cannot tolerate CPAP.  11. Active smoker: PFTs in 2019 were relatively normal.  She does use oxygen at night.  - PFTs (5/21): FEV1 82%, FVC 80%, ratio 102% => minimal obstruction.  12. PAD: ABIs (4/21) 0.76 on right, 0.86 on left => significant right SFA stenosis.  - Atherectomy/angioplasty right SFA.  - Peripheral arterial dopplers (2/22): 50-74% stenosis right SFA.  13. Chronic penetrating atherosclerotic ulcer descending thoracic aorta.  14. Small cell lung cancer: Diagnosed 12/22. Treated with carboplatin and etoposide, and radiation therapy.  Social History   Socioeconomic History   Marital status: Divorced    Spouse name: Not on file   Number of children: 0   Years of education: Not on file   Highest education level: Not on file  Occupational History   Occupation: disabled  Tobacco Use   Smoking status: Every Day    Packs/day: 1.00    Years: 51.00    Pack years: 51.00    Types: Cigarettes    Start date: 42    Last attempt to quit: 02/22/2020    Years since quitting: 2.0   Smokeless tobacco: Never   Tobacco comments:    Pt smokes a 1 pack a day 08/30/2021 / pl  Vaping Use   Vaping Use: Former  Substance and Sexual  Activity   Alcohol use: No   Drug use: No   Sexual activity: Not Currently    Partners: Male    Birth control/protection: None  Other Topics Concern   Not on file  Social History Narrative   Right handed   Lives alone in a one story home   Social Determinants of Health   Financial Resource Strain: Not on file  Food Insecurity: Not on file  Transportation Needs: Not on file  Physical Activity: Not on file  Stress: Not on file  Social Connections: Not on file  Intimate Partner Violence: Not on file   Family History  Problem Relation Age of Onset   Heart disease Mother    Diabetes Mother    COPD Mother    Hyperlipidemia Mother    Hypertension Mother    Cancer Father        met, origin unknown  Heart attack Father    Hypertension Sister    Cancer Sister        eyelid   Glaucoma Sister    Heart disease Maternal Grandfather    Drug abuse Paternal Grandmother    Stroke Paternal Grandfather    Heart disease Maternal Aunt        x 2 aunts   Lung cancer Paternal Aunt        lung with mets to brain   Melanoma Paternal Uncle    Lung cancer Paternal Uncle        lung/liver to brain   Cancer Paternal Uncle        cancer of unknown type   Colon cancer Neg Hx    Stomach cancer Neg Hx    Esophageal cancer Neg Hx    ROS: All systems reviewed and negative except as per HPI.   Current Outpatient Medications  Medication Sig Dispense Refill   acetaminophen (TYLENOL) 500 MG tablet Take 1,000 mg by mouth at bedtime.     albuterol (PROVENTIL) (2.5 MG/3ML) 0.083% nebulizer solution Take 3 mLs (2.5 mg total) by nebulization every 6 (six) hours as needed for wheezing or shortness of breath. 1180 mL 3   albuterol (VENTOLIN HFA) 108 (90 Base) MCG/ACT inhaler Inhale 2 puffs into the lungs every 6 (six) hours as needed for wheezing or shortness of breath. 24 g 3   aspirin EC 81 MG tablet Take 81 mg by mouth every evening.  30 tablet 6   BD PEN NEEDLE NANO 2ND GEN 32G X 4 MM MISC Inject  into the skin 3 (three) times daily.     clopidogrel (PLAVIX) 75 MG tablet Take 1 tablet (75 mg total) by mouth daily. 90 tablet 3   dexlansoprazole (DEXILANT) 60 MG capsule TAKE 1 CAPSULE DAILY 90 capsule 1   diphenhydrAMINE (BENADRYL) 25 mg capsule Take 25 mg by mouth 2 (two) times daily as needed for itching or allergies.     ezetimibe (ZETIA) 10 MG tablet Take 1 tablet (10 mg total) by mouth daily. PLEASE CALL OFFICE FOR FOLLOW UP APPOINTMENT 90 tablet 3   fluticasone (FLONASE) 50 MCG/ACT nasal spray Place 2 sprays into both nostrils daily. (Patient taking differently: Place 2 sprays into both nostrils daily as needed for allergies.) 48 g 3   gabapentin (NEURONTIN) 300 MG capsule TAKE 2 CAPSULES DAILY AT   BEDTIME 180 capsule 1   glucose blood (ONETOUCH ULTRA) test strip 3 (three) times daily.     guaiFENesin (MUCINEX) 600 MG 12 hr tablet Take 2 tablets (1,200 mg total) by mouth 2 (two) times daily. 360 tablet 3   HYDROcodone-acetaminophen (NORCO) 5-325 MG tablet Take 1 tablet by mouth every 6 (six) hours as needed for moderate pain. 30 tablet 0   icosapent Ethyl (VASCEPA) 1 g capsule Take 2 capsules (2 g total) by mouth 2 (two) times daily. 360 capsule 3   insulin aspart protamine- aspart (NOVOLOG MIX 70/30) (70-30) 100 UNIT/ML injection Inject 5-65 Units into the skin See admin instructions. 65 units in the Am with breakfast and 5 units with lunch and 55 units with dinner     levETIRAcetam (KEPPRA) 1000 MG tablet Take 1 tablet (1,000 mg total) by mouth 2 (two) times daily. 180 tablet 1   lidocaine (XYLOCAINE) 2 % solution Use 15 mLs in the mouth or throat as directed as needed for pain. 200 mL 3   lidocaine-prilocaine (EMLA) cream Apply to the Port-A-Cath site 30-60 minutes before  chemotherapy 30 g 0   metFORMIN (GLUCOPHAGE-XR) 500 MG 24 hr tablet Take 500 mg by mouth every evening.   6   metolazone (ZAROXOLYN) 2.5 MG tablet TAKE 1 TABLET TWICE WEEKLY,EVERY MONDAY AND FRIDAY    (CHANGE IN  DOSAGE). 26 tablet 3   nicotine polacrilex (NICORETTE) 4 MG gum Take 4 mg by mouth daily as needed for smoking cessation.     nitroGLYCERIN (NITROLINGUAL) 0.4 MG/SPRAY spray Place 1 spray under the tongue every 5 (five) minutes x 3 doses as needed for chest pain. 12 g 3   nystatin (MYCOSTATIN/NYSTOP) powder APPLY 1 APPLICATION        TOPICALLY 4 TIMES A DAY AS NEEDED FOR YEAST INFECTIONS 60 g 5   ONE TOUCH ULTRA TEST test strip USE AS DIRECTED FOR TESTING BLOOD GLUCOSE 3 TIMES DAILY  1   polyethylene glycol (MIRALAX / GLYCOLAX) 17 g packet Take 17 g by mouth daily.     Potassium Chloride (KLOR-CON PO) Take 20 mEq by mouth. Patient takes 2 tablets by mouth in the morning and 1 tablet in the evening.     prochlorperazine (COMPAZINE) 10 MG tablet Take 1 tablet (10 mg total) by mouth every 6 (six) hours as needed for nausea or vomiting. 30 tablet 0   promethazine (PHENERGAN) 12.5 MG tablet Take 1 tablet (12.5 mg total) by mouth every 6 (six) hours as needed for nausea or vomiting. 30 tablet 2   ranolazine (RANEXA) 1000 MG SR tablet Take 1 tablet (1,000 mg total) by mouth 2 (two) times daily. 180 tablet 3   rosuvastatin (CRESTOR) 20 MG tablet Take 1 tablet (20 mg total) by mouth daily. PLEASE CALL OFFICE FOR FOLLOW UP APPOINTMENT 90 tablet 3   sodium chloride (OCEAN) 0.65 % SOLN nasal spray Place 1 spray into both nostrils as needed for congestion.     spironolactone (ALDACTONE) 25 MG tablet Take 1 tablet (25 mg total) by mouth at bedtime. PLEASE CALL OFFICE FOR FOLLOW UP APPOINTMENT 90 tablet 3   torsemide (DEMADEX) 20 MG tablet Take 3 tablets (60 mg total) by mouth 2 (two) times daily. 540 tablet 3   triamcinolone cream (KENALOG) 0.5 % Apply 1 application topically 3 (three) times daily as needed (rash). To affected areas     VERQUVO 10 MG TABS TAKE 1 TABLET ONCE DAILY 90 tablet 3   digoxin (LANOXIN) 0.125 MG tablet Take 0.5 tablets (0.0625 mg total) by mouth daily. (Patient not taking: Reported on  03/07/2022) 45 tablet 3   sucralfate (CARAFATE) 1 g tablet Take 1 tablet (1 g total) by mouth 4 (four) times daily -  with meals and at bedtime. Crush and dissolve in 10 mL of warm water prior to swallowing (Patient not taking: Reported on 03/07/2022) 120 tablet 1   No current facility-administered medications for this encounter.   Wt Readings from Last 3 Encounters:  03/07/22 51.6 kg (113 lb 12.8 oz)  03/06/22 53.5 kg (118 lb)  02/19/22 52.6 kg (116 lb)   BP 102/62   Pulse (!) 108   Wt 51.6 kg (113 lb 12.8 oz)   LMP 09/02/2003   SpO2 92%   BMI 24.63 kg/m  Physical Exam: General:  NAD. No resp difficulty, chronically-ill appearing, walked into clinic with rolling walker. HEENT: Normal Neck: Supple. No JVD. Carotids 2+ bilat; no bruits. No lymphadenopathy or thryomegaly appreciated. Cor: PMI nondisplaced. Regular rate & rhythm. No rubs, gallops or murmurs. Lungs: Clear Abdomen: Soft, nontender, nondistended. No hepatosplenomegaly. No bruits  or masses. Good bowel sounds. Extremities: No cyanosis, clubbing, rash, edema Neuro: Alert & oriented x 3, cranial nerves grossly intact. Moves all 4 extremities w/o difficulty. Affect pleasant.  Assessment/Plan:  1. Chronic systolic CHF: Ischemic cardiomyopathy, [employer] ICD.  Echo in 2/21 showed EF 20-25%, moderately dilated and moderately dysfunctional RV, PASP 49, severe biatrial enlargement, moderate-severe MR, severe TR, mild-moderate AS, IVC dilated. CPX (5/21) with moderate HF limitation, RHC (4/21) with CI low at 2.12.  Medication titration has been severely limited by hypotension and orthostatic symptoms.  Not a transplant candidate due to smoking. She was seen by cardiac surgery at [facility] and not thought to be good LVAD candidate due to small stature, small LV cavity, and redo sternotomy.  I sent her for a 2nd opinion at [facility].  She had echo there in 11/21 with EF higher at 35% but CI low on RHC at 1.8, no LVAD planned based on higher  EF.  Echo in 5/22 showed EF yet again increased to 45-50%.  NYHA class II-early III symptoms, functional class difficult due to claudication, physical deconditioning and recent lung CA treatments, REDs 31%. She does not appear volume overloaded on exam or by symptoms, HL score elevated but thoracic impedence up, discordant with exam findings. - Continue torsemide 60 mg bid. BMET and BNP today. - Continue vericiguat 10 mg daily.  - Continue metolazone 2.5 mg every Monday and Friday. - Continue spironolactone 25 mg daily.  - Continue digoxin 0.0625 mg daily. She did not take today, check level. - She has not tolerated BP- active meds well, avoid Coreg and Entresto for now.  She became hypotensive with losartan.  - She did not tolerate SGLT2 inhibitor due to recurrent GU yeast infections.  - She does not qualify for the barostimulation trials due to carotid disease.  - With higher EF, she is not a candidate for LVAD. 2. CAD: s/p CABG 1950 complicated by early graft closure.  Repeat cath in 2007 showed only LIMA-LAD still open and she had DES to LCX/OM.  Cath in 2016 showed this stent was occluded.  She has not had targets for redo CABG and she does not have good interventional targets. No recent chest pain. - Continue ASA 81, Plavix 75 mg daily.  - Continue ranolazone 1000 bid.   - Continue Crestor 20 mg daily + Zetia 10 mg daily, and Vascepa. Good lipids in 9/22. 3. Smoking: Still smoking 1/2 ppd.  She is trying to quit.  - I again recommended nicotine patches.  4. PAD: s/p atherectomy/angioplasty right SFA in 7/21. Still with significant claudication. Peripheral arterial dopplers in 2/22 with 50-74% right SFA stenosis.  - Follows with Dr. Gwenlyn Found for PV.  5. Carotid stenosis: Moderate stenosis, repeat dopplers 6/23.   6. Fe deficiency anemia: Has been FOBT negative.  Has had Feraheme.   7. Small cell lung cancer: Finished chemo and radiation, awaiting repeat scan.  Follow up 3 months with Dr.  Aundra Dubin.   Cromwell FNP 03/07/2022

## 2022-03-07 NOTE — Addendum Note (Signed)
Encounter addended by: Kerry Dory, CMA on: 03/07/2022 3:28 PM  Actions taken: New alternative orders accepted, Order list changed, Specialty comments modified, Clinical Note Signed, Charge Capture section accepted

## 2022-03-08 ENCOUNTER — Inpatient Hospital Stay: Payer: Medicare Other | Attending: Internal Medicine

## 2022-03-08 ENCOUNTER — Other Ambulatory Visit: Payer: Self-pay

## 2022-03-08 ENCOUNTER — Inpatient Hospital Stay (HOSPITAL_COMMUNITY): Admission: RE | Admit: 2022-03-08 | Payer: Medicare Other | Source: Ambulatory Visit

## 2022-03-08 DIAGNOSIS — D509 Iron deficiency anemia, unspecified: Secondary | ICD-10-CM

## 2022-03-08 DIAGNOSIS — C3491 Malignant neoplasm of unspecified part of right bronchus or lung: Secondary | ICD-10-CM | POA: Diagnosis not present

## 2022-03-08 DIAGNOSIS — Z452 Encounter for adjustment and management of vascular access device: Secondary | ICD-10-CM | POA: Diagnosis not present

## 2022-03-08 DIAGNOSIS — Z95828 Presence of other vascular implants and grafts: Secondary | ICD-10-CM

## 2022-03-08 MED ORDER — SODIUM CHLORIDE 0.9% FLUSH
10.0000 mL | Freq: Once | INTRAVENOUS | Status: AC
  Administered 2022-03-08: 10 mL

## 2022-03-08 MED ORDER — HEPARIN SOD (PORK) LOCK FLUSH 100 UNIT/ML IV SOLN
500.0000 [IU] | Freq: Once | INTRAVENOUS | Status: AC
  Administered 2022-03-08: 500 [IU]

## 2022-03-09 ENCOUNTER — Other Ambulatory Visit (HOSPITAL_COMMUNITY): Payer: Self-pay | Admitting: Cardiology

## 2022-03-13 ENCOUNTER — Encounter: Payer: Medicare Other | Admitting: Gastroenterology

## 2022-03-14 ENCOUNTER — Encounter (HOSPITAL_COMMUNITY): Payer: Medicare Other

## 2022-03-20 DIAGNOSIS — E119 Type 2 diabetes mellitus without complications: Secondary | ICD-10-CM | POA: Diagnosis not present

## 2022-03-20 DIAGNOSIS — E1165 Type 2 diabetes mellitus with hyperglycemia: Secondary | ICD-10-CM | POA: Diagnosis not present

## 2022-03-20 DIAGNOSIS — E7800 Pure hypercholesterolemia, unspecified: Secondary | ICD-10-CM | POA: Diagnosis not present

## 2022-03-20 DIAGNOSIS — E041 Nontoxic single thyroid nodule: Secondary | ICD-10-CM | POA: Diagnosis not present

## 2022-03-20 DIAGNOSIS — I10 Essential (primary) hypertension: Secondary | ICD-10-CM | POA: Diagnosis not present

## 2022-03-22 ENCOUNTER — Encounter: Payer: Self-pay | Admitting: *Deleted

## 2022-03-22 NOTE — Progress Notes (Signed)
I followed up on Diane [name]'s treatment plan. Patient is on observation and has a followed up with med onc next month.

## 2022-03-28 ENCOUNTER — Other Ambulatory Visit (HOSPITAL_COMMUNITY): Payer: Self-pay | Admitting: Cardiology

## 2022-03-29 ENCOUNTER — Other Ambulatory Visit: Payer: Self-pay | Admitting: Neurology

## 2022-03-29 DIAGNOSIS — G40009 Localization-related (focal) (partial) idiopathic epilepsy and epileptic syndromes with seizures of localized onset, not intractable, without status epilepticus: Secondary | ICD-10-CM

## 2022-03-30 DIAGNOSIS — I5042 Chronic combined systolic (congestive) and diastolic (congestive) heart failure: Secondary | ICD-10-CM | POA: Diagnosis not present

## 2022-03-30 DIAGNOSIS — E119 Type 2 diabetes mellitus without complications: Secondary | ICD-10-CM | POA: Diagnosis not present

## 2022-03-30 DIAGNOSIS — J449 Chronic obstructive pulmonary disease, unspecified: Secondary | ICD-10-CM | POA: Diagnosis not present

## 2022-04-04 ENCOUNTER — Encounter: Payer: Self-pay | Admitting: Radiation Oncology

## 2022-04-08 NOTE — Progress Notes (Signed)
Radiation Oncology         [phone] ________________________________  Name: Diane [name] MRN: [id number]  Date: 04/09/2022  DOB: 07/25/1956  Follow-Up Visit Note  CC: Tower, [name], MD  Curt Bears, MD  No diagnosis found.  Diagnosis: The primary encounter diagnosis was Malignant neoplasm of upper lobe of right lung ([place]). A diagnosis of Malignant neoplasm of unspecified part of unspecified bronchus or lung ([place]) was also pertinent to this visit.  Interval Since Last Radiation: 3 months and 12 days   Intent: Curative  Radiation Treatment Dates: 11/15/2021 through 12/26/2021 Site Technique Total Dose (Gy) Dose per Fx (Gy) Completed Fx Beam Energies  Lung, Right: Lung_R 3D 60/60 2 30/30 6X    Narrative:  The patient returns today for routine follow-up, she was last seen here for follow up on 01/29/22. Since her last visit, the patient presented to her gastroenterologist, Alonza Bogus PA-C, on 02/02/22 for evaluation of dysphagia/odynophagia. Following evaluation, the patient was scheduled for an esophagram followed by an EGD.   Esophagram performed on 02/12/22 showed that the barium tablet became stuck at the level of the midesophagus, but no definite stricture was appreciated. Imaging also showed small volume GERD in the proximal esophagus.   The patient proceeded to undergo upper endoscopy with biopsies on 02/19/22. Pathology showed reactive gastropathy with hyperplastic changes from biopsies of the gastric antrum and body (no evidence of H. pylori or intestinal metaplasia identified). Esophageal biopsies also collected showed no evidence of malignancy, with findings consistent with ulcerative esophagitis.                              Otherwise, no significant interval history since the patient was last seen for follow-up.   ***  Allergies:  is allergic to bee venom, benadryl [diphenhydramine], entresto [sacubitril-valsartan], farxiga [dapagliflozin], tetracycline,  canagliflozin, morphine sulfate er, sitagliptin, and ace inhibitors.  Meds: Current Outpatient Medications  Medication Sig Dispense Refill   acetaminophen (TYLENOL) 500 MG tablet Take 1,000 mg by mouth at bedtime.     albuterol (PROVENTIL) (2.5 MG/3ML) 0.083% nebulizer solution Take 3 mLs (2.5 mg total) by nebulization every 6 (six) hours as needed for wheezing or shortness of breath. 1180 mL 3   albuterol (VENTOLIN HFA) 108 (90 Base) MCG/ACT inhaler Inhale 2 puffs into the lungs every 6 (six) hours as needed for wheezing or shortness of breath. 24 g 3   aspirin EC 81 MG tablet Take 81 mg by mouth every evening.  30 tablet 6   BD PEN NEEDLE NANO 2ND GEN 32G X 4 MM MISC Inject into the skin 3 (three) times daily.     clopidogrel (PLAVIX) 75 MG tablet TAKE 1 TABLET DAILY 90 tablet 3   dexlansoprazole (DEXILANT) 60 MG capsule TAKE 1 CAPSULE DAILY 90 capsule 1   digoxin (LANOXIN) 0.125 MG tablet TAKE 1/2 TABLET DAILY 45 tablet 3   diphenhydrAMINE (BENADRYL) 25 mg capsule Take 25 mg by mouth 2 (two) times daily as needed for itching or allergies.     ezetimibe (ZETIA) 10 MG tablet Take 1 tablet (10 mg total) by mouth daily. PLEASE CALL OFFICE FOR FOLLOW UP APPOINTMENT 90 tablet 3   fluticasone (FLONASE) 50 MCG/ACT nasal spray Place 2 sprays into both nostrils daily. (Patient taking differently: Place 2 sprays into both nostrils daily as needed for allergies.) 48 g 3   gabapentin (NEURONTIN) 300 MG capsule TAKE 2 CAPSULES DAILY AT  BEDTIME 180 capsule 1   glucose blood (ONETOUCH ULTRA) test strip 3 (three) times daily.     guaiFENesin (MUCINEX) 600 MG 12 hr tablet Take 2 tablets (1,200 mg total) by mouth 2 (two) times daily. 360 tablet 3   HYDROcodone-acetaminophen (NORCO) 5-325 MG tablet Take 1 tablet by mouth every 6 (six) hours as needed for moderate pain. 30 tablet 0   icosapent Ethyl (VASCEPA) 1 g capsule Take 2 capsules (2 g total) by mouth 2 (two) times daily. 360 capsule 3   insulin aspart  protamine- aspart (NOVOLOG MIX 70/30) (70-30) 100 UNIT/ML injection Inject 5-65 Units into the skin See admin instructions. 65 units in the Am with breakfast and 5 units with lunch and 55 units with dinner     levETIRAcetam (KEPPRA) 1000 MG tablet Take 1 tablet (1,000 mg total) by mouth 2 (two) times daily. 180 tablet 1   lidocaine (XYLOCAINE) 2 % solution Use 15 mLs in the mouth or throat as directed as needed for pain. 200 mL 3   lidocaine-prilocaine (EMLA) cream Apply to the Port-A-Cath site 30-60 minutes before chemotherapy 30 g 0   metFORMIN (GLUCOPHAGE-XR) 500 MG 24 hr tablet Take 500 mg by mouth every evening.   6   metolazone (ZAROXOLYN) 2.5 MG tablet TAKE 1 TABLET TWICE WEEKLY,EVERY MONDAY AND FRIDAY    (CHANGE IN DOSAGE). 26 tablet 3   nicotine polacrilex (NICORETTE) 4 MG gum Take 4 mg by mouth daily as needed for smoking cessation.     nitroGLYCERIN (NITROLINGUAL) 0.4 MG/SPRAY spray Place 1 spray under the tongue every 5 (five) minutes x 3 doses as needed for chest pain. 12 g 3   nystatin (MYCOSTATIN/NYSTOP) powder APPLY 1 APPLICATION        TOPICALLY 4 TIMES A DAY AS NEEDED FOR YEAST INFECTIONS 60 g 5   ONE TOUCH ULTRA TEST test strip USE AS DIRECTED FOR TESTING BLOOD GLUCOSE 3 TIMES DAILY  1   polyethylene glycol (MIRALAX / GLYCOLAX) 17 g packet Take 17 g by mouth daily.     potassium chloride (KLOR-CON) 10 MEQ tablet Take 4 tablets (40 mEq total) by mouth every morning AND 2 tablets (20 mEq total) every evening. 90 tablet 3   prochlorperazine (COMPAZINE) 10 MG tablet Take 1 tablet (10 mg total) by mouth every 6 (six) hours as needed for nausea or vomiting. 30 tablet 0   promethazine (PHENERGAN) 12.5 MG tablet Take 1 tablet (12.5 mg total) by mouth every 6 (six) hours as needed for nausea or vomiting. 30 tablet 2   ranolazine (RANEXA) 1000 MG SR tablet TAKE 1 TABLET TWICE A DAY 180 tablet 3   rosuvastatin (CRESTOR) 20 MG tablet Take 1 tablet (20 mg total) by mouth daily. PLEASE CALL  OFFICE FOR FOLLOW UP APPOINTMENT 90 tablet 3   sodium chloride (OCEAN) 0.65 % SOLN nasal spray Place 1 spray into both nostrils as needed for congestion.     spironolactone (ALDACTONE) 25 MG tablet Take 1 tablet (25 mg total) by mouth at bedtime. PLEASE CALL OFFICE FOR FOLLOW UP APPOINTMENT 90 tablet 3   sucralfate (CARAFATE) 1 g tablet Take 1 tablet (1 g total) by mouth 4 (four) times daily -  with meals and at bedtime. Crush and dissolve in 10 mL of warm water prior to swallowing (Patient not taking: Reported on 03/07/2022) 120 tablet 1   torsemide (DEMADEX) 20 MG tablet Take 3 tablets (60 mg total) by mouth 2 (two) times daily. 540 tablet 3  triamcinolone cream (KENALOG) 0.5 % Apply 1 application topically 3 (three) times daily as needed (rash). To affected areas     VERQUVO 10 MG TABS TAKE 1 TABLET ONCE DAILY 90 tablet 3   No current facility-administered medications for this encounter.    Physical Findings: The patient is in no acute distress. Patient is alert and oriented.  vitals were not taken for this visit. .  No significant changes. Lungs are clear to auscultation bilaterally. Heart has regular rate and rhythm. No palpable cervical, supraclavicular, or axillary adenopathy. Abdomen soft, non-tender, normal bowel sounds. ***   Lab Findings: Lab Results  Component Value Date   WBC 4.5 01/24/2022   HGB 11.2 (L) 01/24/2022   HCT 35.3 (L) 01/24/2022   MCV 99.2 01/24/2022   PLT 206 01/24/2022    Radiographic Findings: No results found.  Impression: The primary encounter diagnosis was Malignant neoplasm of upper lobe of right lung ([place]). A diagnosis of Malignant neoplasm of unspecified part of unspecified bronchus or lung ([place]) was also pertinent to this visit.  The patient is recovering from the effects of radiation.  ***  Plan:  ***   *** minutes of total time was spent for this patient encounter, including preparation, face-to-face counseling with the patient and coordination  of care, physical exam, and documentation of the encounter. ____________________________________  Blair Promise, PhD, MD  This document serves as a record of services personally performed by Gery Pray, MD. It was created on his behalf by Roney Mans, a trained medical scribe. The creation of this record is based on the scribe's personal observations and the provider's statements to them. This document has been checked and approved by the attending provider.

## 2022-04-09 ENCOUNTER — Other Ambulatory Visit: Payer: Self-pay

## 2022-04-09 ENCOUNTER — Encounter: Payer: Self-pay | Admitting: Radiation Oncology

## 2022-04-09 ENCOUNTER — Ambulatory Visit
Admission: RE | Admit: 2022-04-09 | Discharge: 2022-04-09 | Disposition: A | Discharge status: Home / Self Care | Payer: Medicare Other | Source: Ambulatory Visit | Attending: Radiation Oncology | Admitting: Radiation Oncology

## 2022-04-09 DIAGNOSIS — K219 Gastro-esophageal reflux disease without esophagitis: Secondary | ICD-10-CM | POA: Insufficient documentation

## 2022-04-09 DIAGNOSIS — Z7902 Long term (current) use of antithrombotics/antiplatelets: Secondary | ICD-10-CM | POA: Diagnosis not present

## 2022-04-09 DIAGNOSIS — Z79899 Other long term (current) drug therapy: Secondary | ICD-10-CM | POA: Insufficient documentation

## 2022-04-09 DIAGNOSIS — C3411 Malignant neoplasm of upper lobe, right bronchus or lung: Secondary | ICD-10-CM | POA: Diagnosis not present

## 2022-04-09 DIAGNOSIS — F17210 Nicotine dependence, cigarettes, uncomplicated: Secondary | ICD-10-CM | POA: Diagnosis not present

## 2022-04-09 DIAGNOSIS — Z923 Personal history of irradiation: Secondary | ICD-10-CM | POA: Insufficient documentation

## 2022-04-09 DIAGNOSIS — Z7982 Long term (current) use of aspirin: Secondary | ICD-10-CM | POA: Diagnosis not present

## 2022-04-09 DIAGNOSIS — Z85118 Personal history of other malignant neoplasm of bronchus and lung: Secondary | ICD-10-CM | POA: Insufficient documentation

## 2022-04-09 DIAGNOSIS — R5383 Other fatigue: Secondary | ICD-10-CM | POA: Insufficient documentation

## 2022-04-09 DIAGNOSIS — C3490 Malignant neoplasm of unspecified part of unspecified bronchus or lung: Secondary | ICD-10-CM

## 2022-04-09 NOTE — Progress Notes (Signed)
Diane [name] is here today for follow up post radiation to the lung.  Lung Side: Right, patient completed treatment on 12/26/21.   Does the patient complain of any of the following: Pain:Patient denies pain.  Shortness of breath w/wo exertion: Yes Cough: Yes,productive. Hemoptysis: No Pain with swallowing: No Swallowing/choking concerns: No Appetite: Good Energy Level: Continues to have a low energy level.  Post radiation skin Changes: No     Additional comments if applicable:   BP 888/75   Pulse (!) 101   Temp (!) 97 F (36.1 C)   Resp 20   Wt 118 lb 12.8 oz (53.9 kg)   LMP 09/02/2003   SpO2 100%   BMI 25.71 kg/m

## 2022-04-18 ENCOUNTER — Inpatient Hospital Stay: Payer: Medicare Other

## 2022-04-19 ENCOUNTER — Ambulatory Visit: Payer: Medicare Other | Admitting: Gastroenterology

## 2022-04-20 ENCOUNTER — Other Ambulatory Visit: Payer: Self-pay | Admitting: Pulmonary Disease

## 2022-04-20 DIAGNOSIS — J9611 Chronic respiratory failure with hypoxia: Secondary | ICD-10-CM

## 2022-04-23 ENCOUNTER — Inpatient Hospital Stay: Payer: Medicare Other | Attending: Internal Medicine

## 2022-04-23 ENCOUNTER — Other Ambulatory Visit: Payer: Self-pay

## 2022-04-23 ENCOUNTER — Ambulatory Visit (HOSPITAL_COMMUNITY)
Admission: RE | Admit: 2022-04-23 | Discharge: 2022-04-23 | Disposition: A | Discharge status: Home / Self Care | Payer: Medicare Other | Source: Ambulatory Visit | Attending: Internal Medicine | Admitting: Internal Medicine

## 2022-04-23 DIAGNOSIS — C3401 Malignant neoplasm of right main bronchus: Secondary | ICD-10-CM | POA: Diagnosis not present

## 2022-04-23 DIAGNOSIS — D509 Iron deficiency anemia, unspecified: Secondary | ICD-10-CM

## 2022-04-23 DIAGNOSIS — Z95828 Presence of other vascular implants and grafts: Secondary | ICD-10-CM

## 2022-04-23 DIAGNOSIS — C3490 Malignant neoplasm of unspecified part of unspecified bronchus or lung: Secondary | ICD-10-CM | POA: Insufficient documentation

## 2022-04-23 DIAGNOSIS — C3491 Malignant neoplasm of unspecified part of right bronchus or lung: Secondary | ICD-10-CM | POA: Diagnosis present

## 2022-04-23 DIAGNOSIS — R911 Solitary pulmonary nodule: Secondary | ICD-10-CM | POA: Diagnosis not present

## 2022-04-23 DIAGNOSIS — R918 Other nonspecific abnormal finding of lung field: Secondary | ICD-10-CM | POA: Diagnosis not present

## 2022-04-23 LAB — CBC WITH DIFFERENTIAL (CANCER CENTER ONLY)
Abs Immature Granulocytes: 0.02 10*3/uL (ref 0.00–0.07)
Basophils Absolute: 0 10*3/uL (ref 0.0–0.1)
Basophils Relative: 1 %
Eosinophils Absolute: 0.1 10*3/uL (ref 0.0–0.5)
Eosinophils Relative: 1 %
HCT: 33 % — ABNORMAL LOW (ref 36.0–46.0)
Hemoglobin: 11.1 g/dL — ABNORMAL LOW (ref 12.0–15.0)
Immature Granulocytes: 0 %
Lymphocytes Relative: 15 %
Lymphs Abs: 0.8 10*3/uL (ref 0.7–4.0)
MCH: 27 pg (ref 26.0–34.0)
MCHC: 33.6 g/dL (ref 30.0–36.0)
MCV: 80.3 fL (ref 80.0–100.0)
Monocytes Absolute: 0.6 10*3/uL (ref 0.1–1.0)
Monocytes Relative: 12 %
Neutro Abs: 3.8 10*3/uL (ref 1.7–7.7)
Neutrophils Relative %: 71 %
Platelet Count: 212 10*3/uL (ref 150–400)
RBC: 4.11 MIL/uL (ref 3.87–5.11)
RDW: 15 % (ref 11.5–15.5)
WBC Count: 5.3 10*3/uL (ref 4.0–10.5)
nRBC: 0 % (ref 0.0–0.2)

## 2022-04-23 LAB — CMP (CANCER CENTER ONLY)
ALT: 9 U/L (ref 0–44)
AST: 13 U/L — ABNORMAL LOW (ref 15–41)
Albumin: 4.3 g/dL (ref 3.5–5.0)
Alkaline Phosphatase: 74 U/L (ref 38–126)
Anion gap: 8 (ref 5–15)
BUN: 23 mg/dL (ref 8–23)
CO2: 30 mmol/L (ref 22–32)
Calcium: 9.4 mg/dL (ref 8.9–10.3)
Chloride: 93 mmol/L — ABNORMAL LOW (ref 98–111)
Creatinine: 0.87 mg/dL (ref 0.44–1.00)
GFR, Estimated: >60 mL/min (ref >60)
Glucose, Bld: 169 mg/dL — ABNORMAL HIGH (ref 70–99)
Potassium: 4 mmol/L (ref 3.5–5.1)
Sodium: 131 mmol/L — ABNORMAL LOW (ref 135–145)
Total Bilirubin: 0.4 mg/dL (ref 0.3–1.2)
Total Protein: 7.1 g/dL (ref 6.5–8.1)

## 2022-04-23 MED ORDER — SODIUM CHLORIDE 0.9% FLUSH
10.0000 mL | Freq: Once | INTRAVENOUS | Status: AC
  Administered 2022-04-23: 10 mL

## 2022-04-23 MED ORDER — SODIUM CHLORIDE (PF) 0.9 % IJ SOLN
INTRAMUSCULAR | Status: AC
  Filled 2022-04-23: qty 50

## 2022-04-23 MED ORDER — IOHEXOL 300 MG/ML  SOLN
100.0000 mL | Freq: Once | INTRAMUSCULAR | Status: AC | PRN
  Administered 2022-04-23: 75 mL via INTRAVENOUS

## 2022-04-23 MED ORDER — HEPARIN SOD (PORK) LOCK FLUSH 100 UNIT/ML IV SOLN
INTRAVENOUS | Status: AC
  Filled 2022-04-23: qty 5

## 2022-04-24 ENCOUNTER — Ambulatory Visit (INDEPENDENT_AMBULATORY_CARE_PROVIDER_SITE_OTHER): Payer: Medicare Other

## 2022-04-24 DIAGNOSIS — I255 Ischemic cardiomyopathy: Secondary | ICD-10-CM

## 2022-04-24 LAB — CUP PACEART REMOTE DEVICE CHECK
Battery Remaining Longevity: 156 mo
Battery Remaining Percentage: 100 %
Brady Statistic RV Percent Paced: 0 %
Date Time Interrogation Session: 20230725041100
HighPow Impedance: 80 Ohm
Implantable Lead Implant Date: 20211015
Implantable Lead Location: 753860
Implantable Lead Model: 672
Implantable Lead Serial Number: 166771
Implantable Pulse Generator Implant Date: 20211015
Lead Channel Impedance Value: 1032 Ohm
Lead Channel Setting Pacing Amplitude: 3.5 V
Lead Channel Setting Pacing Pulse Width: 0.4 ms
Lead Channel Setting Sensing Sensitivity: 0.5 mV
Pulse Gen Serial Number: 279742

## 2022-04-26 ENCOUNTER — Other Ambulatory Visit: Payer: Medicare Other

## 2022-04-30 ENCOUNTER — Inpatient Hospital Stay (HOSPITAL_BASED_OUTPATIENT_CLINIC_OR_DEPARTMENT_OTHER): Payer: Medicare Other | Admitting: Internal Medicine

## 2022-04-30 ENCOUNTER — Other Ambulatory Visit: Payer: Self-pay

## 2022-04-30 VITALS — BP 121/64 | HR 108 | Temp 97.7°F | Resp 16 | Wt 120.2 lb

## 2022-04-30 DIAGNOSIS — D509 Iron deficiency anemia, unspecified: Secondary | ICD-10-CM | POA: Diagnosis not present

## 2022-04-30 DIAGNOSIS — E119 Type 2 diabetes mellitus without complications: Secondary | ICD-10-CM | POA: Diagnosis not present

## 2022-04-30 DIAGNOSIS — I5042 Chronic combined systolic (congestive) and diastolic (congestive) heart failure: Secondary | ICD-10-CM | POA: Diagnosis not present

## 2022-04-30 DIAGNOSIS — C3490 Malignant neoplasm of unspecified part of unspecified bronchus or lung: Secondary | ICD-10-CM | POA: Diagnosis not present

## 2022-04-30 DIAGNOSIS — C3401 Malignant neoplasm of right main bronchus: Secondary | ICD-10-CM | POA: Diagnosis not present

## 2022-04-30 DIAGNOSIS — J449 Chronic obstructive pulmonary disease, unspecified: Secondary | ICD-10-CM | POA: Diagnosis not present

## 2022-04-30 NOTE — Progress Notes (Signed)
[place] Telephone:([phone]) [phone]   Fax:([phone]) [phone]  OFFICE PROGRESS NOTE  Tower, [name], MD [place] [address]  DIAGNOSIS:  1) Limited stage (T3, N2, M0) small cell lung cancer presented with right hilar and mediastinal lymphadenopathy. There was no hypermetabolism in the left adrenal gland lesion. Diagnosed in December 2022. 2)  Iron Deficiency Anemia   PRIOR THERAPY:  1) Systemic chemotherapy with systemic chemotherapy with carboplatin for an AUC of 5, etoposide 100 mg per metered squared, and Imfinzi 1500 mg IV every 3 weeks with Cosela for myeloprotection.  First dose on 10/16/2021.  Discontinued after 1 cycle as the patient staging PET scan showed limited stage disease. 2) systemic chemotherapy with carboplatin for an AUC of 5 on day 1 and etoposide 100 mg/m2 on days 1, 2, and 3 IV every 3 weeks.  First dose on 11/06/2021.  This will be concurrent with radiotherapy under the care of Dr. Sondra Come.  Status post 3 cycles. 3) Venofer infusions as needed. Last dose on 02/07/21    CURRENT THERAPY: Observation   INTERVAL HISTORY: Diane [name] 66 y.o. female returns to the clinic today for follow-up visit.  The patient has no complaints today except for mild fatigue.  She also has shortness of breath at baseline increased with exertion with no significant chest pain, cough or hemoptysis.  She has no nausea, vomiting, diarrhea or constipation.  She has no headache or visual changes.  She denied having any weight loss or night sweats.  She declined prophylactic cranial irradiation that was recommended by Dr. Sondra Come.  She is currently on observation.  She has repeat CT scan of the chest performed recently and she is here for evaluation and discussion of her scan results.  MEDICAL HISTORY: Past Medical History:  Diagnosis Date   Anemia    Arthritis    Carotid stenosis    a. s/p right carotid stent 10/2017.   Chronic systolic CHF (congestive  heart failure) (HCC)    Colon polyps    COPD (chronic obstructive pulmonary disease) (HCC)    Coronary artery disease    post CABG in 3/07 , coronary stents    Depression    after major stroke years ago, no longer treated   Diabetes mellitus    type 2   Dyslipidemia    Fatty liver    Fibromyalgia    GERD (gastroesophageal reflux disease)    Headache    hx of    History of kidney stones    History of radiation therapy    right lung 11/15/2021-12/26/2021  Dr Gery Pray   Hyperlipidemia    Hypertension    Lung cancer [facility])    Myocardial infarction ([place])    Pneumonia    hx of    Seizures ([place])    last seizure- 03/2013    Shortness of breath dyspnea    with exertion or when fluid builds up    Sleep apnea    used to wear a cpap- not used in 3 years    Status post dilation of esophageal narrowing    Stroke [facility]) 1993   problems with balance    Systolic murmur    known mild AS and MR   Thyroid goiter    Tobacco abuse     ALLERGIES:  is allergic to bee venom, benadryl [diphenhydramine], entresto [sacubitril-valsartan], farxiga [dapagliflozin], tetracycline, canagliflozin, morphine sulfate er, sitagliptin, and ace inhibitors.  MEDICATIONS:  Current  Outpatient Medications  Medication Sig Dispense Refill   acetaminophen (TYLENOL) 500 MG tablet Take 1,000 mg by mouth at bedtime.     albuterol (PROVENTIL) (2.5 MG/3ML) 0.083% nebulizer solution Take 3 mLs (2.5 mg total) by nebulization every 6 (six) hours as needed for wheezing or shortness of breath. 1180 mL 3   albuterol (VENTOLIN HFA) 108 (90 Base) MCG/ACT inhaler INHALE 2 PUFFS EVERY 6 HOURS AS NEEDED FOR WHEEZING/SHORTNESS OF BREATH 18 each 0   aspirin EC 81 MG tablet Take 81 mg by mouth every evening.  30 tablet 6   BD PEN NEEDLE NANO 2ND GEN 32G X 4 MM MISC Inject into the skin 3 (three) times daily.     clopidogrel (PLAVIX) 75 MG tablet TAKE 1 TABLET DAILY 90 tablet 3   dexlansoprazole (DEXILANT) 60 MG capsule TAKE 1 CAPSULE  DAILY 90 capsule 1   digoxin (LANOXIN) 0.125 MG tablet TAKE 1/2 TABLET DAILY 45 tablet 3   diphenhydrAMINE (BENADRYL) 25 mg capsule Take 25 mg by mouth 2 (two) times daily as needed for itching or allergies.     ezetimibe (ZETIA) 10 MG tablet Take 1 tablet (10 mg total) by mouth daily. PLEASE CALL OFFICE FOR FOLLOW UP APPOINTMENT 90 tablet 3   fluticasone (FLONASE) 50 MCG/ACT nasal spray Place 2 sprays into both nostrils daily. (Patient taking differently: Place 2 sprays into both nostrils daily as needed for allergies.) 48 g 3   gabapentin (NEURONTIN) 300 MG capsule TAKE 2 CAPSULES DAILY AT   BEDTIME 180 capsule 1   glucose blood (ONETOUCH ULTRA) test strip 3 (three) times daily.     guaiFENesin (MUCINEX) 600 MG 12 hr tablet Take 2 tablets (1,200 mg total) by mouth 2 (two) times daily. 360 tablet 3   HYDROcodone-acetaminophen (NORCO) 5-325 MG tablet Take 1 tablet by mouth every 6 (six) hours as needed for moderate pain. 30 tablet 0   icosapent Ethyl (VASCEPA) 1 g capsule Take 2 capsules (2 g total) by mouth 2 (two) times daily. 360 capsule 3   insulin aspart protamine- aspart (NOVOLOG MIX 70/30) (70-30) 100 UNIT/ML injection Inject 5-65 Units into the skin See admin instructions. 65 units in the Am with breakfast and 5 units with lunch and 55 units with dinner     levETIRAcetam (KEPPRA) 1000 MG tablet Take 1 tablet (1,000 mg total) by mouth 2 (two) times daily. 180 tablet 1   lidocaine (XYLOCAINE) 2 % solution Use 15 mLs in the mouth or throat as directed as needed for pain. (Patient not taking: Reported on 04/09/2022) 200 mL 3   lidocaine-prilocaine (EMLA) cream Apply to the Port-A-Cath site 30-60 minutes before chemotherapy 30 g 0   metFORMIN (GLUCOPHAGE-XR) 500 MG 24 hr tablet Take 500 mg by mouth every evening.   6   metolazone (ZAROXOLYN) 2.5 MG tablet TAKE 1 TABLET TWICE WEEKLY,EVERY MONDAY AND FRIDAY    (CHANGE IN DOSAGE). 26 tablet 3   nicotine polacrilex (NICORETTE) 4 MG gum Take 4 mg by  mouth daily as needed for smoking cessation. (Patient not taking: Reported on 04/09/2022)     nitroGLYCERIN (NITROLINGUAL) 0.4 MG/SPRAY spray Place 1 spray under the tongue every 5 (five) minutes x 3 doses as needed for chest pain. (Patient not taking: Reported on 04/09/2022) 12 g 3   nystatin (MYCOSTATIN/NYSTOP) powder APPLY 1 APPLICATION        TOPICALLY 4 TIMES A DAY AS NEEDED FOR YEAST INFECTIONS 60 g 5   ONE TOUCH ULTRA TEST test strip  USE AS DIRECTED FOR TESTING BLOOD GLUCOSE 3 TIMES DAILY  1   polyethylene glycol (MIRALAX / GLYCOLAX) 17 g packet Take 17 g by mouth daily.     potassium chloride (KLOR-CON) 10 MEQ tablet Take 4 tablets (40 mEq total) by mouth every morning AND 2 tablets (20 mEq total) every evening. 90 tablet 3   prochlorperazine (COMPAZINE) 10 MG tablet Take 1 tablet (10 mg total) by mouth every 6 (six) hours as needed for nausea or vomiting. (Patient not taking: Reported on 04/09/2022) 30 tablet 0   promethazine (PHENERGAN) 12.5 MG tablet Take 1 tablet (12.5 mg total) by mouth every 6 (six) hours as needed for nausea or vomiting. (Patient not taking: Reported on 04/09/2022) 30 tablet 2   ranolazine (RANEXA) 1000 MG SR tablet TAKE 1 TABLET TWICE A DAY 180 tablet 3   rosuvastatin (CRESTOR) 20 MG tablet Take 1 tablet (20 mg total) by mouth daily. PLEASE CALL OFFICE FOR FOLLOW UP APPOINTMENT 90 tablet 3   sodium chloride (OCEAN) 0.65 % SOLN nasal spray Place 1 spray into both nostrils as needed for congestion.     spironolactone (ALDACTONE) 25 MG tablet Take 1 tablet (25 mg total) by mouth at bedtime. PLEASE CALL OFFICE FOR FOLLOW UP APPOINTMENT 90 tablet 3   sucralfate (CARAFATE) 1 g tablet Take 1 tablet (1 g total) by mouth 4 (four) times daily -  with meals and at bedtime. Crush and dissolve in 10 mL of warm water prior to swallowing (Patient not taking: Reported on 03/07/2022) 120 tablet 1   torsemide (DEMADEX) 20 MG tablet Take 3 tablets (60 mg total) by mouth 2 (two) times daily. 540  tablet 3   triamcinolone cream (KENALOG) 0.5 % Apply 1 application topically 3 (three) times daily as needed (rash). To affected areas     VERQUVO 10 MG TABS TAKE 1 TABLET ONCE DAILY 90 tablet 3   No current facility-administered medications for this visit.    SURGICAL HISTORY:  Past Surgical History:  Procedure Laterality Date   ABDOMINAL AORTOGRAM W/LOWER EXTREMITY N/A 04/18/2020   Procedure: ABDOMINAL AORTOGRAM W/LOWER EXTREMITY;  Surgeon: Lorretta Harp, MD;  Location: [place] CV LAB;  Service: Cardiovascular;  Laterality: N/A;   APPENDECTOMY     BIOPSY  02/03/2019   Procedure: BIOPSY;  Surgeon: Ladene Artist, MD;  Location: [facility] ENDOSCOPY;  Service: Endoscopy;;   BREAST BIOPSY Left 2016   BRONCHIAL BIOPSY  09/18/2021   Procedure: BRONCHIAL BIOPSIES;  Surgeon: Collene Gobble, MD;  Location: [facility] ENDOSCOPY;  Service: Pulmonary;;   BRONCHIAL BRUSHINGS  09/18/2021   Procedure: BRONCHIAL BRUSHINGS;  Surgeon: Collene Gobble, MD;  Location: [facility] ENDOSCOPY;  Service: Pulmonary;;   BRONCHIAL NEEDLE ASPIRATION BIOPSY  09/18/2021   Procedure: BRONCHIAL NEEDLE ASPIRATION BIOPSIES;  Surgeon: Collene Gobble, MD;  Location: [facility] ENDOSCOPY;  Service: Pulmonary;;   CARDIAC CATHETERIZATION  11/29/05   EF of 55%   CARDIAC CATHETERIZATION  08/06/06   EF of 45-50%   CARDIAC CATHETERIZATION N/A 05/06/2015   Procedure: Right/Left Heart Cath and Coronary/Graft Angiography;  Surgeon: Peter M Martinique, MD;  Location: [place] CV LAB;  Service: Cardiovascular;  Laterality: N/A;   CAROTID PTA/STENT INTERVENTION N/A 10/10/2017   Procedure: CAROTID PTA/STENT INTERVENTION - Right;  Surgeon: Serafina Mitchell, MD;  Location: [place] [facility] LAB;  Service: Cardiovascular;  Laterality: N/A;   CERVICAL FUSION  1990   CHOLECYSTECTOMY     COLON RESECTION     mass removed and  4 in of colon   COLONOSCOPY WITH PROPOFOL N/A 07/16/2017   Procedure: COLONOSCOPY WITH PROPOFOL;  Surgeon: Ladene Artist, MD;  Location: [facility]  ENDOSCOPY;  Service: Endoscopy;  Laterality: N/A;   CORONARY ARTERY BYPASS GRAFT  12/04/2005   x5 -- left internal mammary artery to the LAD, left radial artery to the ramus intermedius, saphenous vein graft to the obtuse marginal 1, sequential saphenous vein grat to the acute marginal and posterior descending, endoscopic vein harvesting from the left thigh with open vein harvest from right leg   CORONARY STENT PLACEMENT  08/11/06   PCI of her ciurcumflex/OM vessel   ESOPHAGOGASTRODUODENOSCOPY (EGD) WITH PROPOFOL N/A 02/03/2019   Procedure: ESOPHAGOGASTRODUODENOSCOPY (EGD) WITH PROPOFOL;  Surgeon: Ladene Artist, MD;  Location: [facility] ENDOSCOPY;  Service: Endoscopy;  Laterality: N/A;   FINE NEEDLE ASPIRATION  09/18/2021   Procedure: FINE NEEDLE ASPIRATION (FNA) LINEAR;  Surgeon: Collene Gobble, MD;  Location: [facility] ENDOSCOPY;  Service: Pulmonary;;   ICD IMPLANT N/A 07/15/2020   Procedure: ICD IMPLANT;  Surgeon: Vickie Epley, MD;  Location: [place] CV LAB;  Service: Cardiovascular;  Laterality: N/A;   IR IMAGING GUIDED PORT INSERTION  10/20/2021   LAPAROTOMY Bilateral 05/19/2015   Procedure: EXPLORATORY LAPAROTOMY WITH BILATERAL SALPINGO OOPHORECTOMY /OMENTECTOMY/SEGMENTAL SIGMOID COLECTOMY ;  Surgeon: Everitt Amber, MD;  Location: [facility] ORS;  Service: Gynecology;  Laterality: Bilateral;   PERIPHERAL VASCULAR BALLOON ANGIOPLASTY  04/18/2020   Procedure: PERIPHERAL VASCULAR BALLOON ANGIOPLASTY;  Surgeon: Lorretta Harp, MD;  Location: [place] CV LAB;  Service: Cardiovascular;;  rt SFA  atherectomy and DCB   PERIPHERAL VASCULAR CATHETERIZATION N/A 11/15/2015   Procedure: Carotid PTA/Stent Intervention;  Surgeon: Lorretta Harp, MD;  Location: [place] CV LAB;  Service: Cardiovascular;  Laterality: N/A;   PERIPHERAL VASCULAR CATHETERIZATION  11/15/2015   Procedure: Carotid Angiography;  Surgeon: Lorretta Harp, MD;  Location: [place] CV LAB;  Service: Cardiovascular;;   RIGHT HEART CATH N/A  01/27/2020   Procedure: RIGHT HEART CATH;  Surgeon: Larey Dresser, MD;  Location: [place] CV LAB;  Service: Cardiovascular;  Laterality: N/A;   VIDEO BRONCHOSCOPY WITH ENDOBRONCHIAL ULTRASOUND N/A 09/18/2021   Procedure: VIDEO BRONCHOSCOPY WITH ENDOBRONCHIAL ULTRASOUND;  Surgeon: Collene Gobble, MD;  Location: [place] ENDOSCOPY;  Service: Pulmonary;  Laterality: N/A;   VIDEO BRONCHOSCOPY WITH RADIAL ENDOBRONCHIAL ULTRASOUND  09/18/2021   Procedure: RADIAL ENDOBRONCHIAL ULTRASOUND;  Surgeon: Collene Gobble, MD;  Location: [facility] ENDOSCOPY;  Service: Pulmonary;;    REVIEW OF SYSTEMS:  A comprehensive review of systems was negative except for: Constitutional: positive for fatigue Respiratory: positive for dyspnea on exertion   PHYSICAL EXAMINATION: General appearance: alert, cooperative, appears stated age, and no distress Head: Normocephalic, without obvious abnormality, atraumatic Neck: no adenopathy, no JVD, supple, symmetrical, trachea midline, and thyroid not enlarged, symmetric, no tenderness/mass/nodules Lymph nodes: Cervical, supraclavicular, and axillary nodes normal. Resp: clear to auscultation bilaterally Back: symmetric, no curvature. ROM normal. No CVA tenderness. Cardio: regular rate and rhythm, S1, S2 normal, no murmur, click, rub or gallop GI: soft, non-tender; bowel sounds normal; no masses,  no organomegaly Extremities: extremities normal, atraumatic, no cyanosis or edema  ECOG PERFORMANCE STATUS: 1 - Symptomatic but completely ambulatory  Blood pressure 121/64, pulse (!) 108, temperature 97.7 F (36.5 C), temperature source Oral, resp. rate 16, weight 120 lb 3.2 oz (54.5 kg), last menstrual period 09/02/2003, SpO2 97 %.  LABORATORY DATA: Lab Results  Component Value Date   WBC 5.3 04/23/2022  HGB 11.1 (L) 04/23/2022   HCT 33.0 (L) 04/23/2022   MCV 80.3 04/23/2022   PLT 212 04/23/2022      Chemistry      Component Value Date/Time   NA 131 (L) 04/23/2022 1238    NA 135 04/14/2020 1544   NA 135 (L) 02/15/2017 1457   K 4.0 04/23/2022 1238   K 4.8 02/15/2017 1457   CL 93 (L) 04/23/2022 1238   CO2 30 04/23/2022 1238   CO2 26 02/15/2017 1457   BUN 23 04/23/2022 1238   BUN 17 04/14/2020 1544   BUN 10.8 02/15/2017 1457   CREATININE 0.87 04/23/2022 1238   CREATININE 0.69 05/06/2017 1405   CREATININE 0.7 02/15/2017 1457      Component Value Date/Time   CALCIUM 9.4 04/23/2022 1238   CALCIUM 9.3 02/15/2017 1457   ALKPHOS 74 04/23/2022 1238   ALKPHOS 75 02/15/2017 1457   AST 13 (L) 04/23/2022 1238   AST 9 02/15/2017 1457   ALT 9 04/23/2022 1238   ALT 13 02/15/2017 1457   BILITOT 0.4 04/23/2022 1238   BILITOT 0.28 02/15/2017 1457       RADIOGRAPHIC STUDIES: CUP PACEART REMOTE DEVICE CHECK  Result Date: 04/24/2022 Scheduled remote reviewed. Normal device function.  Next remote 91 days- JJB  CT Chest W Contrast  Result Date: 04/24/2022 CLINICAL DATA:  Small cell lung cancer staging evaluation. * Tracking Code: BO * EXAM: CT CHEST WITH CONTRAST TECHNIQUE: Multidetector CT imaging of the chest was performed during intravenous contrast administration. RADIATION DOSE REDUCTION: This exam was performed according to the departmental dose-optimization program which includes automated exposure control, adjustment of the mA and/or kV according to patient size and/or use of iterative reconstruction technique. CONTRAST:  72m OMNIPAQUE IOHEXOL 300 MG/ML  SOLN COMPARISON:  January 18, 2022. FINDINGS: Cardiovascular: RIGHT-sided Port-A-Cath in place terminating in the mid to distal superior vena cava. LEFT-sided cardiac pacer device also in place, leads in the RIGHT heart and power pack over the LEFT chest entering via subclavian approach. Post median sternotomy for CABG. Calcified and noncalcified aortic atherosclerosis. Calcification of native coronary vasculature with signs of prior percutaneous coronary intervention. Stable appearance of penetrating  atherosclerotic ulcer along the LEFT lateral distal thoracic aorta. Stable appearance of 4.2 cm ascending thoracic aortic dilation. Central pulmonary vessels are unremarkable to the extent evaluated. Mediastinum/Nodes: Normal appearance grossly of the esophagus. No signs of adenopathy in the chest. Fullness of RIGHT hilar nodal tissue seen on previous imaging is stable at 12 mm. (Image 67/2) Lungs/Pleura: Signs of pulmonary emphysema. Mild subpleural reticulation throughout the chest. No consolidation. No sign of pleural effusion. Airways are patent. Small pulmonary nodule in the medial RIGHT upper lobe (image 46/7) this is new compared to previous imaging. New nodule (image 61/7) RIGHT upper lobe 4 mm. New nodule (56/7) 4 mm medial RIGHT upper lobe. Small nodule in the anterior RIGHT upper lobe seen previously is decreased in conspicuity since previous imaging. Airways are patent. Mild pleural and parenchymal scarring. Upper Abdomen: Post cholecystectomy. Stable LEFT adrenal lesion and bilateral adrenal hyperplasia. LEFT adrenal lesion met criteria for adenoma on previous imaging. Spleen, visualized pancreas, visualized kidneys and gastrointestinal tract without acute process. Musculoskeletal: No acute bone finding. No destructive bone process. Spinal degenerative changes. IMPRESSION: 1. Signs of pulmonary emphysema and mild subpleural reticulation throughout the chest. 2. New pulmonary nodules in the RIGHT upper lobe, the largest of which measures 4 mm. Findings could be inflammatory but would suggest close attention on follow-up. Resolution  of the small nodule seen in the anterior RIGHT upper lobe since previous imaging. 3. Stable appearance of penetrating atherosclerotic ulcer along the LEFT lateral distal thoracic aorta. 4. Stable mild aneurysmal dilation of the ascending thoracic aorta. Attention on follow-up imaging. 5. Stable LEFT adrenal lesion and bilateral adrenal hyperplasia. LEFT adrenal lesion met  criteria for adenoma on previous imaging. Aortic Atherosclerosis (ICD10-I70.0) and Emphysema (ICD10-J43.9). Electronically Signed   By: Zetta Bills M.D.   On: 04/24/2022 08:51    ASSESSMENT AND PLAN: This is a very pleasant 66 years old white female recently diagnosed with Limited stage (T3, N2, M0) small cell lung cancer presented with right hilar and mediastinal lymphadenopathy. There was no hypermetabolism in the left adrenal gland lesion. Diagnosed in December 2022. She initially started treatment with carboplatin, etoposide, Cosela and Imfinzi with the expectation that she had extensive stage disease status post 1 cycle. Staging work-up with PET scan showed no hypermetabolic activity and the suspicious left adrenal gland lesion. Her treatment was switched to chemotherapy with with carboplatin for AUC of 5 on day 1, etoposide 100 Mg/M2 on days 1, 2 and 3 every 3 weeks concurrent with radiation.  First dose today 11/06/2021.  Status post 3 cycles. She tolerated her treatment well with no concerning adverse effect except for fatigue as well as radiation-induced esophagitis with dysphagia and odynophagia. The patient declined prophylactic cranial irradiation. She is currently on observation.  She had repeat CT scan of the chest performed recently.  I personally and independently reviewed the scan and discussed the result with the patient today. Her scan showed no concerning findings for disease progression. I recommended for her to continue on observation with repeat CT scan of the chest in 3 months. For the history of iron deficiency anemia, her anemia is much better.   She will also continue to have Port-A-Cath flush every 6 weeks. She was advised to call immediately if she has any concerning symptoms in the interval. The patient voices understanding of current disease status and treatment options and is in agreement with the current care plan.  All questions were answered. The patient knows to  call the clinic with any problems, questions or concerns. We can certainly see the patient much sooner if necessary. The total time spent in the appointment was 20 minutes.  Disclaimer: This note was dictated with voice recognition software. Similar sounding words can inadvertently be transcribed and may not be corrected upon review.

## 2022-05-01 DIAGNOSIS — E119 Type 2 diabetes mellitus without complications: Secondary | ICD-10-CM | POA: Diagnosis not present

## 2022-05-01 DIAGNOSIS — E7800 Pure hypercholesterolemia, unspecified: Secondary | ICD-10-CM | POA: Diagnosis not present

## 2022-05-15 ENCOUNTER — Other Ambulatory Visit: Payer: Self-pay | Admitting: Cardiology

## 2022-05-15 ENCOUNTER — Other Ambulatory Visit: Payer: Self-pay

## 2022-05-15 MED ORDER — POTASSIUM CHLORIDE ER 10 MEQ PO TBCR: EXTENDED_RELEASE_TABLET | ORAL | 3 refills | Status: DC

## 2022-05-18 ENCOUNTER — Other Ambulatory Visit: Payer: Self-pay | Admitting: Pulmonary Disease

## 2022-05-18 DIAGNOSIS — J9611 Chronic respiratory failure with hypoxia: Secondary | ICD-10-CM

## 2022-05-22 NOTE — Progress Notes (Signed)
Remote ICD transmission.   

## 2022-05-30 ENCOUNTER — Other Ambulatory Visit: Payer: Self-pay

## 2022-05-30 ENCOUNTER — Inpatient Hospital Stay: Payer: Medicare Other | Attending: Internal Medicine

## 2022-05-30 DIAGNOSIS — Z95828 Presence of other vascular implants and grafts: Secondary | ICD-10-CM

## 2022-05-30 DIAGNOSIS — Z452 Encounter for adjustment and management of vascular access device: Secondary | ICD-10-CM | POA: Insufficient documentation

## 2022-05-30 DIAGNOSIS — C3491 Malignant neoplasm of unspecified part of right bronchus or lung: Secondary | ICD-10-CM | POA: Insufficient documentation

## 2022-05-30 DIAGNOSIS — D509 Iron deficiency anemia, unspecified: Secondary | ICD-10-CM

## 2022-05-30 MED ORDER — HEPARIN SOD (PORK) LOCK FLUSH 100 UNIT/ML IV SOLN
500.0000 [IU] | Freq: Once | INTRAVENOUS | Status: AC
  Administered 2022-05-30: 500 [IU]

## 2022-05-30 MED ORDER — SODIUM CHLORIDE 0.9% FLUSH
10.0000 mL | Freq: Once | INTRAVENOUS | Status: AC
  Administered 2022-05-30: 10 mL

## 2022-05-31 DIAGNOSIS — J449 Chronic obstructive pulmonary disease, unspecified: Secondary | ICD-10-CM | POA: Diagnosis not present

## 2022-05-31 DIAGNOSIS — E119 Type 2 diabetes mellitus without complications: Secondary | ICD-10-CM | POA: Diagnosis not present

## 2022-05-31 DIAGNOSIS — I5042 Chronic combined systolic (congestive) and diastolic (congestive) heart failure: Secondary | ICD-10-CM | POA: Diagnosis not present

## 2022-06-04 ENCOUNTER — Other Ambulatory Visit (HOSPITAL_COMMUNITY): Payer: Self-pay | Admitting: Family Medicine

## 2022-06-07 NOTE — Progress Notes (Signed)
  [facility] is the home-based palliative care program of [facility] [place].  Primary Diagnosis: COPD  Other significant diagnosis:  small cell lung cancer, CHF, DM2, anxiety, depression, HTN, partial seizure, carotid stenosis, fibromyalgia, CVA (plavix), current smoker, pacemaker, ischemic cardiomyopathy with ICD, CABG (2007) Admitted to [facility]: 07/19/16 Current and previous weight: 120.8 lb (114.5 lb in May 2023, 132.8 lbs in 08/2021, 130.3 lb in Feb 2022, admission weight 138lbs)   Current and previous MAC: 25.4 cm (26cm in 05/2021)  Current and previous PPS: 60% (admission was 60%)   Hospitalization/ED visit in the last 3 months: none   Current symptoms/issues/interventions: Pt was diagnosed with small cell lung cancer in Dec 2022 and is undergoing ctreatment.  She has COPD and is supposed to wear 02 at nighttime but is unable to because she said that it comes off during the night. Pt has intermittent episodes of chest pain but is able to take 1 nitroglycerin and it goes away. Pt has some trouble sleeping through the night and waking up frequently but declines wanting to try any new meds because of feelings of drowsiness when waking the next morning. We monitor the patient's weight and BP and spO2 using devices in the home. Social Work Interventions: Remains available for PRN phone support or visits.  Signs of decline over last 3 months: increased fatigue Goals of Care: Pt wishes to remain home and be independent as possible. Pt is a Full Code. Pt has HCPOA-her sister Delta "[name]" Brande.

## 2022-06-14 ENCOUNTER — Other Ambulatory Visit: Payer: Self-pay | Admitting: Pulmonary Disease

## 2022-06-14 DIAGNOSIS — J9611 Chronic respiratory failure with hypoxia: Secondary | ICD-10-CM

## 2022-06-15 ENCOUNTER — Encounter (HOSPITAL_COMMUNITY): Payer: Medicare Other | Admitting: Cardiology

## 2022-06-17 ENCOUNTER — Other Ambulatory Visit: Payer: Self-pay | Admitting: Family Medicine

## 2022-06-18 ENCOUNTER — Encounter (HOSPITAL_COMMUNITY): Payer: Self-pay | Admitting: Cardiology

## 2022-06-18 ENCOUNTER — Ambulatory Visit (HOSPITAL_COMMUNITY)
Admission: RE | Admit: 2022-06-18 | Discharge: 2022-06-18 | Disposition: A | Discharge status: Home / Self Care | Payer: Medicare Other | Source: Ambulatory Visit | Attending: Cardiology | Admitting: Cardiology

## 2022-06-18 VITALS — BP 130/80 | HR 100 | Wt 119.6 lb

## 2022-06-18 DIAGNOSIS — I951 Orthostatic hypotension: Secondary | ICD-10-CM | POA: Diagnosis not present

## 2022-06-18 DIAGNOSIS — E1151 Type 2 diabetes mellitus with diabetic peripheral angiopathy without gangrene: Secondary | ICD-10-CM | POA: Diagnosis not present

## 2022-06-18 DIAGNOSIS — E1169 Type 2 diabetes mellitus with other specified complication: Secondary | ICD-10-CM

## 2022-06-18 DIAGNOSIS — D509 Iron deficiency anemia, unspecified: Secondary | ICD-10-CM | POA: Diagnosis not present

## 2022-06-18 DIAGNOSIS — Z95810 Presence of automatic (implantable) cardiac defibrillator: Secondary | ICD-10-CM | POA: Diagnosis not present

## 2022-06-18 DIAGNOSIS — I110 Hypertensive heart disease with heart failure: Secondary | ICD-10-CM | POA: Diagnosis not present

## 2022-06-18 DIAGNOSIS — Z7902 Long term (current) use of antithrombotics/antiplatelets: Secondary | ICD-10-CM | POA: Insufficient documentation

## 2022-06-18 DIAGNOSIS — Z951 Presence of aortocoronary bypass graft: Secondary | ICD-10-CM | POA: Insufficient documentation

## 2022-06-18 DIAGNOSIS — I739 Peripheral vascular disease, unspecified: Secondary | ICD-10-CM | POA: Diagnosis not present

## 2022-06-18 DIAGNOSIS — Z955 Presence of coronary angioplasty implant and graft: Secondary | ICD-10-CM | POA: Diagnosis not present

## 2022-06-18 DIAGNOSIS — F17210 Nicotine dependence, cigarettes, uncomplicated: Secondary | ICD-10-CM | POA: Insufficient documentation

## 2022-06-18 DIAGNOSIS — Z9221 Personal history of antineoplastic chemotherapy: Secondary | ICD-10-CM | POA: Diagnosis not present

## 2022-06-18 DIAGNOSIS — G4733 Obstructive sleep apnea (adult) (pediatric): Secondary | ICD-10-CM | POA: Insufficient documentation

## 2022-06-18 DIAGNOSIS — E785 Hyperlipidemia, unspecified: Secondary | ICD-10-CM | POA: Insufficient documentation

## 2022-06-18 DIAGNOSIS — Z85118 Personal history of other malignant neoplasm of bronchus and lung: Secondary | ICD-10-CM | POA: Insufficient documentation

## 2022-06-18 DIAGNOSIS — Z79899 Other long term (current) drug therapy: Secondary | ICD-10-CM | POA: Diagnosis not present

## 2022-06-18 DIAGNOSIS — I5022 Chronic systolic (congestive) heart failure: Secondary | ICD-10-CM | POA: Diagnosis not present

## 2022-06-18 DIAGNOSIS — Z7982 Long term (current) use of aspirin: Secondary | ICD-10-CM | POA: Insufficient documentation

## 2022-06-18 DIAGNOSIS — G40909 Epilepsy, unspecified, not intractable, without status epilepticus: Secondary | ICD-10-CM | POA: Diagnosis not present

## 2022-06-18 DIAGNOSIS — I493 Ventricular premature depolarization: Secondary | ICD-10-CM | POA: Diagnosis not present

## 2022-06-18 DIAGNOSIS — I255 Ischemic cardiomyopathy: Secondary | ICD-10-CM | POA: Insufficient documentation

## 2022-06-18 DIAGNOSIS — Z8673 Personal history of transient ischemic attack (TIA), and cerebral infarction without residual deficits: Secondary | ICD-10-CM | POA: Diagnosis not present

## 2022-06-18 DIAGNOSIS — Z923 Personal history of irradiation: Secondary | ICD-10-CM | POA: Insufficient documentation

## 2022-06-18 DIAGNOSIS — I2510 Atherosclerotic heart disease of native coronary artery without angina pectoris: Secondary | ICD-10-CM | POA: Insufficient documentation

## 2022-06-18 DIAGNOSIS — I491 Atrial premature depolarization: Secondary | ICD-10-CM | POA: Insufficient documentation

## 2022-06-18 DIAGNOSIS — I6529 Occlusion and stenosis of unspecified carotid artery: Secondary | ICD-10-CM | POA: Diagnosis not present

## 2022-06-18 DIAGNOSIS — Z7984 Long term (current) use of oral hypoglycemic drugs: Secondary | ICD-10-CM | POA: Insufficient documentation

## 2022-06-18 DIAGNOSIS — I25708 Atherosclerosis of coronary artery bypass graft(s), unspecified, with other forms of angina pectoris: Secondary | ICD-10-CM | POA: Diagnosis not present

## 2022-06-18 DIAGNOSIS — Z794 Long term (current) use of insulin: Secondary | ICD-10-CM | POA: Insufficient documentation

## 2022-06-18 DIAGNOSIS — Z8249 Family history of ischemic heart disease and other diseases of the circulatory system: Secondary | ICD-10-CM | POA: Insufficient documentation

## 2022-06-18 LAB — BASIC METABOLIC PANEL
Anion gap: 14 (ref 5–15)
BUN: 20 mg/dL (ref 8–23)
CO2: 26 mmol/L (ref 22–32)
Calcium: 9.6 mg/dL (ref 8.9–10.3)
Chloride: 90 mmol/L — ABNORMAL LOW (ref 98–111)
Creatinine, Ser: 0.92 mg/dL (ref 0.44–1.00)
GFR, Estimated: >60 mL/min (ref >60)
Glucose, Bld: 253 mg/dL — ABNORMAL HIGH (ref 70–99)
Potassium: 4.3 mmol/L (ref 3.5–5.1)
Sodium: 130 mmol/L — ABNORMAL LOW (ref 135–145)

## 2022-06-18 LAB — LIPID PANEL
Cholesterol: 104 mg/dL (ref 0–200)
HDL: 33 mg/dL — ABNORMAL LOW (ref >40)
LDL Cholesterol: 34 mg/dL (ref 0–99)
Total CHOL/HDL Ratio: 3.2 RATIO
Triglycerides: 186 mg/dL — ABNORMAL HIGH (ref <150)
VLDL: 37 mg/dL (ref 0–40)

## 2022-06-18 LAB — DIGOXIN LEVEL: Digoxin Level: 0.7 ng/mL — ABNORMAL LOW (ref 0.8–2.0)

## 2022-06-18 MED ORDER — TORSEMIDE 20 MG PO TABS: 60.0000 mg | ORAL_TABLET | Freq: Two times a day (BID) | ORAL | 6 refills | Status: DC

## 2022-06-18 NOTE — Patient Instructions (Signed)
INCREASE Torsemide to 60 mg Twice daily  Labs done today, your results will be available in MyChart, we will contact you for abnormal readings.  Repeat blood work in 10 days.  Your physician recommends that you schedule a follow-up appointment in: 2 months  If you have any questions or concerns before your next appointment please send [place] a message through [name] or call our office at [phone].    TO LEAVE A MESSAGE FOR THE NURSE SELECT OPTION 2, PLEASE LEAVE A MESSAGE INCLUDING: YOUR NAME DATE OF BIRTH CALL BACK NUMBER REASON FOR CALL**this is important as we prioritize the call backs  YOU WILL RECEIVE A CALL BACK THE SAME DAY AS LONG AS YOU CALL BEFORE 4:00 PM  At the [facility], you and your health needs are our priority. As part of our continuing mission to provide you with exceptional heart care, we have created designated Provider Care Teams. These Care Teams include your primary Cardiologist (physician) and Advanced Practice Providers (APPs- Physician Assistants and Nurse Practitioners) who all work together to provide you with the care you need, when you need it.   You may see any of the following providers on your designated Care Team at your next follow up: Dr Glori Bickers Dr Loralie Champagne Dr. Roxana Hires, NP Lyda Jester, [place] [facility] [place], [place] Forestine Na, NP Audry Riles, PharmD   Please be sure to bring in all your medications bottles to every appointment.

## 2022-06-18 NOTE — Progress Notes (Signed)
PCP: [name], MD Cardiology: Dr. Martinique HF Cardiology: Dr. Aundra Dubin  66 y.o. with history of CAD, ischemic cardiomyopathy, CVA, and valvular disease was referred by Dr. Martinique for CHF evaluation.  Patient has an extensive history of vascular disease.  She had CABG in 2007.  Repeat LHC in 2007 showed all grafts closed except LIMA-LAD.  Patient had DES to LCx/OM.  [place] 2016 showed occlusion of DES to LCx/OM with L-L and L-R collaterals. Poor targets for redo CABG.   In 2/21, echo was done showing EF 20-25% with moderate RV dysfunction, moderate-severe MR, moderate TR.  Despite smoking history, PFTs in 2019 looked relatively normal. She additionally had the placement of a carotid stent on the right in 1/19.    RHC was done in 4/21, showing near-normal filling pressures with CI 2.12 by Fick.  CPX in 5/21 showed peak VO2 13, VE/VCO2 slope 37 => moderate HF limitation.  The PFTs on the CPX were not markedly abnormal. She saw Dr. Gwenlyn Found recently, peripheral arterial dopplers in 4/21 showed significant stenosis right SFA.   We worked her up for LVAD.  She was presented at [facility] and saw Dr. Prescott Gum.  He was concerned by her small stature and small LV cavity. Concern that Heartmate 3 would not function well in her small LV, and Heartware LVAD that was thought to be more suitable for small patients has been discontinued.   We considered her for a barostimulation trial, but she does not qualify for either ANTHEM or Batwire due to carotid disease.   She is off dapagliflozin due to recurrrent GU yeast infections.  In 7/21, she had atherectomy of right SFA.  [place] placed in 10/21.   As she was turned down for LVAD at [facility], Dr. Aundra Dubin had her seen at [facility]. She underwent workup there with echo in 11/21 showing higher EF 35%. RHC, however, showed low CI 1.8.    Echo in 5/22 showed improvement in EF, now up to 45-50% with basal-mid inferolateral and anterolateral akinesis, normal RV.    Underwent bronchoscopy 12/19 for pulmonary nodules, suspicious for malignancy.  Follow up 12/22, NYHA II-III symptoms, mildly volume overloaded and torsemide increased to 60 bid.  Unfortunately diagnosed with small cell lung cancer 12/22 and has had treatment with chemo and radiation.   Today she returns for HF follow up. Still smoking 1/2 ppd.  Weight up 6 lbs.  She continues to have fatigue chronically.  She walks with a walker.  No recent falls.  Occasional lightheadedness if she stands too fast.  No chest pain.  No problems walking short distances but gets short of breath with longer distances. Her legs get bilateral pain/cramping at night when she is sleeping.  She does not report claudication-type symptoms.  No stroke-like symptoms.   ECG (personally reviewed): NSR with PVCs, LVH with repolarization abnormality.   [employer] device interrogation: Heartlogic score 21 (personally reviewed).               Labs (4/21): K 3.7 => 3.4, creatinine 0.78 => 0.7, hgb 11.3 => 14.4 Labs (5/21): digoxin 0.6, K 3.5, creatinine 0.63 Labs (6/21): K 4.1, creatinine 0.7, LDL 61, TGs 247 Labs (7/21): K 5, creatinine 0.76, hgb 16.9.  Labs (10/21): K 4.6, creatinine 0.86 Labs (11/21): digoxin 0.9, LDL 45, HDL 27, K 4, creatinine 0.86 Labs (4/22): hgb 14.4, K 4.9, creatinine 0.81, digoxin 0.7 Labs (9/22): hgb 14.5, K 4.9, creatinine 0.8, LDL 38 Labs (4/23): K 4.3,  creatinine 0.56  Labs (7/23): hgb 11.1, K 4, creatinine 0.87  PMH: 1. CAD: CABG 2007.  - LHC in 2007 showed all grafts closed except LIMA-LAD.  Patient had DES to LCx/OM.   - [place] 2016 showed occlusion of DES to LCx/OM with L-L and L-R collaterals.  - Not candidate for redo CABG with poor targets.  2. Chronic systolic CHF: Ischemic cardiomyopathy.   - Echo (2/21): EF 20-25%, moderately dilated and moderately dysfunctional RV, PASP 49, severe biatrial enlargement, moderate-severe MR, severe TR, mild-moderate AS, IVC dilated.  - RHC  (4/21): mean RA 6, PA 41/16, mean 29, mean PCWP 15, CI 2.12, PVR 4.3 WU - CPX (5/21): peak VO2 13, VE/VCO2 slope 37, RER 1.06 => moderate HF limitation.  - Echo (11/21): EF 35%, mild LVH.  - RHC (11/21): baseline CI 1.8, exercise peak VO2 12.2, PCWP up to 24 with exercise.  - Echo (5/22): EF 45-50%, basal-mid inferolateral and anterolateral akinesis, normal RV.  3. Fe deficiency anemia: FOBT negative.  4. Carotid stenosis: Right carotid stent in 1/19.  - Carotid dopplers (8/32): 91-91% LICA stenosis.  - Carotid dopplers (6/60): 60-04% LICA stenosis.  - Carotid dopplers (5/99): 77-41% LICA stenosis.  5. H/o CVA 6. HTN 7. Type 2 diabetes 8. Hyperlipidemia 9. Seizure disorder 10. OSA: Cannot tolerate CPAP.  11. Active smoker: PFTs in 2019 were relatively normal.  She does use oxygen at night.  - PFTs (5/21): FEV1 82%, FVC 80%, ratio 102% => minimal obstruction.  12. PAD: ABIs (4/21) 0.76 on right, 0.86 on left => significant right SFA stenosis.  - Atherectomy/angioplasty right SFA.  - Peripheral arterial dopplers (2/22): 50-74% stenosis right SFA.  13. Chronic penetrating atherosclerotic ulcer descending thoracic aorta.  14. Small cell lung cancer: Diagnosed 12/22. Treated with carboplatin and etoposide, and radiation therapy. 15. Esophageal stricture with dilation  Social History   Socioeconomic History   Marital status: Divorced    Spouse name: Not on file   Number of children: 0   Years of education: Not on file   Highest education level: Not on file  Occupational History   Occupation: disabled  Tobacco Use   Smoking status: Every Day    Packs/day: 1.00    Years: 51.00    Total pack years: 51.00    Types: Cigarettes    Start date: 67    Last attempt to quit: 02/22/2020    Years since quitting: 2.3   Smokeless tobacco: Never   Tobacco comments:    Pt smokes a 1 pack a day 08/30/2021 / pl  Vaping Use   Vaping Use: Former  Substance and Sexual Activity   Alcohol use:  No   Drug use: No   Sexual activity: Not Currently    Partners: Male    Birth control/protection: None  Other Topics Concern   Not on file  Social History Narrative   Right handed   Lives alone in a one story home   Social Determinants of Health   Financial Resource Strain: Low Risk  (04/20/2019)   Overall Financial Resource Strain (CARDIA)    Difficulty of Paying Living Expenses: Not hard at all  Food Insecurity: No Food Insecurity (09/13/2020)   Hunger Vital Sign    Worried About Running Out of Food in the Last Year: Never true    Ran Out of Food in the Last Year: Never true  Transportation Needs: Unmet Transportation Needs (09/13/2020)   PRAPARE - Hydrologist (Medical):  Yes    Lack of Transportation (Non-Medical): No  Physical Activity: Insufficiently Active (04/20/2019)   Exercise Vital Sign    Days of Exercise per Week: 2 days    Minutes of Exercise per Session: 60 min  Stress: No Stress Concern Present (04/20/2019)   [place]    Feeling of Stress : Not at all  Social Connections: Not on file  Intimate Partner Violence: Not At Risk (04/20/2019)   Humiliation, Afraid, Rape, and Kick questionnaire    Fear of Current or Ex-Partner: No    Emotionally Abused: No    Physically Abused: No    Sexually Abused: No   Family History  Problem Relation Age of Onset   Heart disease Mother    Diabetes Mother    COPD Mother    Hyperlipidemia Mother    Hypertension Mother    Cancer Father        met, origin unknown   Heart attack Father    Hypertension Sister    Cancer Sister        eyelid   Glaucoma Sister    Heart disease Maternal Grandfather    Drug abuse Paternal Grandmother    Stroke Paternal Grandfather    Heart disease Maternal Aunt        x 2 aunts   Lung cancer Paternal Aunt        lung with mets to brain   Melanoma Paternal Uncle    Lung cancer Paternal Uncle         lung/liver to brain   Cancer Paternal Uncle        cancer of unknown type   Colon cancer Neg Hx    Stomach cancer Neg Hx    Esophageal cancer Neg Hx    ROS: All systems reviewed and negative except as per HPI.   Current Outpatient Medications  Medication Sig Dispense Refill   acetaminophen (TYLENOL) 500 MG tablet Take 1,000 mg by mouth at bedtime.     albuterol (PROVENTIL) (2.5 MG/3ML) 0.083% nebulizer solution Take 3 mLs (2.5 mg total) by nebulization every 6 (six) hours as needed for wheezing or shortness of breath. 1180 mL 3   aspirin EC 81 MG tablet Take 81 mg by mouth every evening.  30 tablet 6   BD PEN NEEDLE NANO 2ND GEN 32G X 4 MM MISC Inject into the skin 3 (three) times daily.     clopidogrel (PLAVIX) 75 MG tablet TAKE 1 TABLET DAILY 90 tablet 3   dexlansoprazole (DEXILANT) 60 MG capsule TAKE 1 CAPSULE DAILY 90 capsule 1   digoxin (LANOXIN) 0.125 MG tablet TAKE 1/2 TABLET DAILY 45 tablet 3   diphenhydrAMINE (BENADRYL) 25 mg capsule Take 25 mg by mouth 2 (two) times daily as needed for itching or allergies.     ezetimibe (ZETIA) 10 MG tablet Take 1 tablet (10 mg total) by mouth daily. PLEASE CALL OFFICE FOR FOLLOW UP APPOINTMENT 90 tablet 3   fluticasone (FLONASE) 50 MCG/ACT nasal spray Place 2 sprays into both nostrils daily. 48 g 3   gabapentin (NEURONTIN) 300 MG capsule TAKE 2 CAPSULES DAILY AT   BEDTIME 180 capsule 1   glucose blood (ONETOUCH ULTRA) test strip 3 (three) times daily.     guaiFENesin (MUCINEX) 600 MG 12 hr tablet Take 2 tablets (1,200 mg total) by mouth 2 (two) times daily. 360 tablet 3   HYDROcodone-acetaminophen (NORCO) 5-325 MG tablet Take 1 tablet by mouth every 6 (  six) hours as needed for moderate pain. 30 tablet 0   icosapent Ethyl (VASCEPA) 1 g capsule Take 1 g by mouth 2 (two) times daily.     insulin aspart protamine- aspart (NOVOLOG MIX 70/30) (70-30) 100 UNIT/ML injection Inject 5-65 Units into the skin See admin instructions. 65 units in the Am with  breakfast and 5 units with lunch and 55 units with dinner     levETIRAcetam (KEPPRA) 1000 MG tablet Take 1 tablet (1,000 mg total) by mouth 2 (two) times daily. 180 tablet 1   lidocaine-prilocaine (EMLA) cream Apply to the Port-A-Cath site 30-60 minutes before chemotherapy 30 g 0   metFORMIN (GLUCOPHAGE) 500 MG tablet Take 500 mg by mouth 2 (two) times daily with a meal.     metolazone (ZAROXOLYN) 2.5 MG tablet TAKE 1 TABLET TWICE WEEKLY,EVERY MONDAY AND FRIDAY    (CHANGE IN DOSAGE). 26 tablet 3   nitroGLYCERIN (NITROLINGUAL) 0.4 MG/SPRAY spray Place 1 spray under the tongue every 5 (five) minutes x 3 doses as needed for chest pain. 12 g 3   nystatin (MYCOSTATIN/NYSTOP) powder APPLY 1 APPLICATION        TOPICALLY 4 TIMES A DAY AS NEEDED FOR YEAST INFECTIONS 60 g 5   ONE TOUCH ULTRA TEST test strip USE AS DIRECTED FOR TESTING BLOOD GLUCOSE 3 TIMES DAILY  1   polyethylene glycol (MIRALAX / GLYCOLAX) 17 g packet Take 17 g by mouth daily.     potassium chloride (KLOR-CON M) 10 MEQ tablet Take 10 mEq by mouth 2 (two) times daily.     promethazine (PHENERGAN) 12.5 MG tablet Take 1 tablet (12.5 mg total) by mouth every 6 (six) hours as needed for nausea or vomiting. 30 tablet 2   ranolazine (RANEXA) 1000 MG SR tablet TAKE 1 TABLET TWICE A DAY 180 tablet 3   rosuvastatin (CRESTOR) 20 MG tablet Take 1 tablet (20 mg total) by mouth daily. PLEASE CALL OFFICE FOR FOLLOW UP APPOINTMENT 90 tablet 3   sodium chloride (OCEAN) 0.65 % SOLN nasal spray Place 1 spray into both nostrils as needed for congestion.     spironolactone (ALDACTONE) 25 MG tablet Take 1 tablet (25 mg total) by mouth at bedtime. PLEASE CALL OFFICE FOR FOLLOW UP APPOINTMENT 90 tablet 3   triamcinolone cream (KENALOG) 0.5 % Apply 1 application topically 3 (three) times daily as needed (rash). To affected areas     VENTOLIN HFA 108 (90 Base) MCG/ACT inhaler INHALE 2 PUFFS EVERY 6 HOURS AS NEEDED FOR WHEEZING/SHORTNESS OF BREATH 18 each 0   VERQUVO  10 MG TABS TAKE 1 TABLET ONCE DAILY 90 tablet 3   torsemide (DEMADEX) 20 MG tablet Take 3 tablets (60 mg total) by mouth 2 (two) times daily. 220 tablet 6   No current facility-administered medications for this encounter.   Wt Readings from Last 3 Encounters:  06/18/22 54.3 kg (119 lb 9.6 oz)  04/30/22 54.5 kg (120 lb 3.2 oz)  04/09/22 53.9 kg (118 lb 12.8 oz)   BP 130/80   Pulse 100   Wt 54.3 kg (119 lb 9.6 oz)   LMP 09/02/2003   SpO2 100%   BMI 25.88 kg/m  General: NAD Neck: JVP difficult, no thyromegaly or thyroid nodule.  Lungs: Clear to auscultation bilaterally with normal respiratory effort. CV: Nondisplaced PMI.  Heart regular S1/S2, no S3/S4, no murmur.  No peripheral edema.  No carotid bruit.  Unable to palpate pedal pulses.  Abdomen: Soft, nontender, no hepatosplenomegaly, no distention.  Skin:  Intact without lesions or rashes.  Neurologic: Alert and oriented x 3.  Psych: Normal affect. Extremities: No clubbing or cyanosis.  HEENT: Normal.   Assessment/Plan:  1. Chronic systolic CHF: Ischemic cardiomyopathy, [employer] ICD.  Echo in 2/21 showed EF 20-25%, moderately dilated and moderately dysfunctional RV, PASP 49, severe biatrial enlargement, moderate-severe MR, severe TR, mild-moderate AS, IVC dilated. CPX (5/21) with moderate HF limitation, RHC (4/21) with CI low at 2.12.  Medication titration has been severely limited by hypotension and orthostatic symptoms.  Not a transplant candidate due to smoking. She was seen by cardiac surgery at [facility] and not thought to be good LVAD candidate due to small stature, small LV cavity, and redo sternotomy.  I sent her for a 2nd opinion at [facility]  She had echo there in 11/21 with EF higher at 35% but CI low on RHC at 1.8, no LVAD planned based on higher EF.  Echo in 5/22 showed EF yet again increased to 45-50%.  NYHA III symptoms chronically.   Suspect mild volume overloaded with HeartLogic elevated though exam is difficult for  volume.  - Increase torsemide to 60 mg bid with BMET/BNP today and in 10 days.  - Continue vericiguat 10 mg daily.  - Continue metolazone 2.5 mg every Monday and Friday. - Continue spironolactone 25 mg daily.  - Continue digoxin 0.0625 mg daily. Check level.  - She has not tolerated BP- active meds well, avoid Coreg and Entresto for now.  She became hypotensive with losartan.  - She did not tolerate SGLT2 inhibitor due to recurrent GU yeast infections.  - I will arrange for repeat echo.  2. CAD: s/p CABG 2395 complicated by early graft closure.  Repeat cath in 2007 showed only LIMA-LAD still open and she had DES to LCX/OM.  Cath in 2016 showed this stent was occluded.  She has not had targets for redo CABG and she does not have good interventional targets. No recent chest pain. - Continue ASA 81, Plavix 75 mg daily.  - Continue ranolazone 1000 bid.   - Continue Crestor 20 mg daily + Zetia 10 mg daily, and Vascepa. Check lipids today.  3. Smoking: Still smoking 1/2 ppd.  She is trying to quit.  - I again recommended nicotine patches.  4. PAD: s/p atherectomy/angioplasty right SFA in 7/21. Still with significant claudication. Peripheral arterial dopplers in 2/22 with 50-74% right SFA stenosis.  - Follows with Dr. Gwenlyn Found for PV, will order peripheral arterial dopplers.  5. Carotid stenosis: Moderate stenosis on prior imaging.  - I will arrange repeat carotid dopplers.  6. Fe deficiency anemia: Has been FOBT negative.  Has had Feraheme.   7. Small cell lung cancer: Finished chemo and radiation. Watchful waiting.   Follow up 2 months.   Loralie Champagne  06/18/2022

## 2022-06-18 NOTE — Telephone Encounter (Signed)
Pt hasn't been seen in a year and no future appts., ? If pt is on hospice

## 2022-06-25 ENCOUNTER — Ambulatory Visit: Payer: Medicare Other

## 2022-06-30 DIAGNOSIS — I4819 Other persistent atrial fibrillation: Secondary | ICD-10-CM | POA: Diagnosis not present

## 2022-06-30 DIAGNOSIS — J449 Chronic obstructive pulmonary disease, unspecified: Secondary | ICD-10-CM | POA: Diagnosis not present

## 2022-07-04 ENCOUNTER — Other Ambulatory Visit (HOSPITAL_COMMUNITY): Payer: Medicare Other

## 2022-07-09 DIAGNOSIS — E1121 Type 2 diabetes mellitus with diabetic nephropathy: Secondary | ICD-10-CM | POA: Diagnosis not present

## 2022-07-09 DIAGNOSIS — E041 Nontoxic single thyroid nodule: Secondary | ICD-10-CM | POA: Diagnosis not present

## 2022-07-09 DIAGNOSIS — I509 Heart failure, unspecified: Secondary | ICD-10-CM | POA: Diagnosis not present

## 2022-07-09 DIAGNOSIS — E7800 Pure hypercholesterolemia, unspecified: Secondary | ICD-10-CM | POA: Diagnosis not present

## 2022-07-09 DIAGNOSIS — E1165 Type 2 diabetes mellitus with hyperglycemia: Secondary | ICD-10-CM | POA: Diagnosis not present

## 2022-07-09 DIAGNOSIS — E119 Type 2 diabetes mellitus without complications: Secondary | ICD-10-CM | POA: Diagnosis not present

## 2022-07-09 DIAGNOSIS — I10 Essential (primary) hypertension: Secondary | ICD-10-CM | POA: Diagnosis not present

## 2022-07-09 DIAGNOSIS — G629 Polyneuropathy, unspecified: Secondary | ICD-10-CM | POA: Diagnosis not present

## 2022-07-11 ENCOUNTER — Other Ambulatory Visit (HOSPITAL_COMMUNITY): Payer: Medicare Other

## 2022-07-11 ENCOUNTER — Other Ambulatory Visit: Payer: Self-pay

## 2022-07-11 ENCOUNTER — Inpatient Hospital Stay: Payer: Medicare Other | Attending: Internal Medicine

## 2022-07-11 DIAGNOSIS — C3401 Malignant neoplasm of right main bronchus: Secondary | ICD-10-CM | POA: Insufficient documentation

## 2022-07-11 DIAGNOSIS — Z79899 Other long term (current) drug therapy: Secondary | ICD-10-CM | POA: Diagnosis not present

## 2022-07-11 DIAGNOSIS — D509 Iron deficiency anemia, unspecified: Secondary | ICD-10-CM | POA: Insufficient documentation

## 2022-07-11 DIAGNOSIS — Z95828 Presence of other vascular implants and grafts: Secondary | ICD-10-CM

## 2022-07-11 DIAGNOSIS — Z452 Encounter for adjustment and management of vascular access device: Secondary | ICD-10-CM | POA: Insufficient documentation

## 2022-07-11 MED ORDER — HEPARIN SOD (PORK) LOCK FLUSH 100 UNIT/ML IV SOLN
500.0000 [IU] | Freq: Once | INTRAVENOUS | Status: AC
  Administered 2022-07-11: 500 [IU]

## 2022-07-11 MED ORDER — SODIUM CHLORIDE 0.9% FLUSH
10.0000 mL | Freq: Once | INTRAVENOUS | Status: AC
  Administered 2022-07-11: 10 mL

## 2022-07-12 ENCOUNTER — Ambulatory Visit (HOSPITAL_COMMUNITY)
Admission: RE | Admit: 2022-07-12 | Discharge: 2022-07-12 | Disposition: A | Discharge status: Home / Self Care | Payer: Medicare Other | Source: Ambulatory Visit | Attending: Internal Medicine | Admitting: Internal Medicine

## 2022-07-12 DIAGNOSIS — I5022 Chronic systolic (congestive) heart failure: Secondary | ICD-10-CM | POA: Diagnosis not present

## 2022-07-12 LAB — BASIC METABOLIC PANEL
Anion gap: 14 (ref 5–15)
BUN: 21 mg/dL (ref 8–23)
CO2: 27 mmol/L (ref 22–32)
Calcium: 9.4 mg/dL (ref 8.9–10.3)
Chloride: 91 mmol/L — ABNORMAL LOW (ref 98–111)
Creatinine, Ser: 0.94 mg/dL (ref 0.44–1.00)
GFR, Estimated: >60 mL/min (ref >60)
Glucose, Bld: 190 mg/dL — ABNORMAL HIGH (ref 70–99)
Potassium: 3.4 mmol/L — ABNORMAL LOW (ref 3.5–5.1)
Sodium: 132 mmol/L — ABNORMAL LOW (ref 135–145)

## 2022-07-18 ENCOUNTER — Inpatient Hospital Stay (HOSPITAL_COMMUNITY): Admission: RE | Admit: 2022-07-18 | Payer: Medicare Other | Source: Ambulatory Visit

## 2022-07-18 ENCOUNTER — Ambulatory Visit (HOSPITAL_COMMUNITY)
Admission: RE | Admit: 2022-07-18 | Discharge: 2022-07-18 | Disposition: A | Discharge status: Home / Self Care | Payer: Medicare Other | Source: Ambulatory Visit | Attending: Internal Medicine | Admitting: Internal Medicine

## 2022-07-18 DIAGNOSIS — I6523 Occlusion and stenosis of bilateral carotid arteries: Secondary | ICD-10-CM | POA: Diagnosis not present

## 2022-07-23 ENCOUNTER — Other Ambulatory Visit: Payer: Medicare Other

## 2022-07-24 ENCOUNTER — Ambulatory Visit (INDEPENDENT_AMBULATORY_CARE_PROVIDER_SITE_OTHER): Payer: Medicare Other

## 2022-07-24 DIAGNOSIS — I255 Ischemic cardiomyopathy: Secondary | ICD-10-CM | POA: Diagnosis not present

## 2022-07-24 LAB — CUP PACEART REMOTE DEVICE CHECK
Battery Remaining Longevity: 150 mo
Battery Remaining Percentage: 100 %
Brady Statistic RV Percent Paced: 0 %
Date Time Interrogation Session: 20231024041000
HighPow Impedance: 96 Ohm
Implantable Lead Connection Status: 753985
Implantable Lead Implant Date: 20211015
Implantable Lead Location: 753860
Implantable Lead Model: 672
Implantable Lead Serial Number: 166771
Implantable Pulse Generator Implant Date: 20211015
Lead Channel Impedance Value: 1061 Ohm
Lead Channel Setting Pacing Amplitude: 3.5 V
Lead Channel Setting Pacing Pulse Width: 0.4 ms
Lead Channel Setting Sensing Sensitivity: 0.5 mV
Pulse Gen Serial Number: 279742
Zone Setting Status: 755011

## 2022-07-27 ENCOUNTER — Inpatient Hospital Stay: Payer: Medicare Other

## 2022-07-27 ENCOUNTER — Other Ambulatory Visit: Payer: Self-pay

## 2022-07-27 DIAGNOSIS — C3401 Malignant neoplasm of right main bronchus: Secondary | ICD-10-CM | POA: Diagnosis not present

## 2022-07-27 DIAGNOSIS — Z452 Encounter for adjustment and management of vascular access device: Secondary | ICD-10-CM | POA: Diagnosis not present

## 2022-07-27 DIAGNOSIS — Z95828 Presence of other vascular implants and grafts: Secondary | ICD-10-CM

## 2022-07-27 DIAGNOSIS — D509 Iron deficiency anemia, unspecified: Secondary | ICD-10-CM

## 2022-07-27 DIAGNOSIS — Z79899 Other long term (current) drug therapy: Secondary | ICD-10-CM | POA: Diagnosis not present

## 2022-07-27 DIAGNOSIS — C3490 Malignant neoplasm of unspecified part of unspecified bronchus or lung: Secondary | ICD-10-CM

## 2022-07-27 LAB — CBC WITH DIFFERENTIAL (CANCER CENTER ONLY)
Abs Immature Granulocytes: 0.02 10*3/uL (ref 0.00–0.07)
Basophils Absolute: 0 10*3/uL (ref 0.0–0.1)
Basophils Relative: 0 %
Eosinophils Absolute: 0 10*3/uL (ref 0.0–0.5)
Eosinophils Relative: 1 %
HCT: 31.7 % — ABNORMAL LOW (ref 36.0–46.0)
Hemoglobin: 9.6 g/dL — ABNORMAL LOW (ref 12.0–15.0)
Immature Granulocytes: 0 %
Lymphocytes Relative: 10 %
Lymphs Abs: 0.8 10*3/uL (ref 0.7–4.0)
MCH: 22.7 pg — ABNORMAL LOW (ref 26.0–34.0)
MCHC: 30.3 g/dL (ref 30.0–36.0)
MCV: 75.1 fL — ABNORMAL LOW (ref 80.0–100.0)
Monocytes Absolute: 0.6 10*3/uL (ref 0.1–1.0)
Monocytes Relative: 8 %
Neutro Abs: 6.4 10*3/uL (ref 1.7–7.7)
Neutrophils Relative %: 81 %
Platelet Count: 255 10*3/uL (ref 150–400)
RBC: 4.22 MIL/uL (ref 3.87–5.11)
RDW: 17.5 % — ABNORMAL HIGH (ref 11.5–15.5)
WBC Count: 7.9 10*3/uL (ref 4.0–10.5)
nRBC: 0 % (ref 0.0–0.2)

## 2022-07-27 LAB — CMP (CANCER CENTER ONLY)
ALT: 7 U/L (ref 0–44)
AST: 10 U/L — ABNORMAL LOW (ref 15–41)
Albumin: 4.1 g/dL (ref 3.5–5.0)
Alkaline Phosphatase: 61 U/L (ref 38–126)
Anion gap: 9 (ref 5–15)
BUN: 15 mg/dL (ref 8–23)
CO2: 30 mmol/L (ref 22–32)
Calcium: 9.4 mg/dL (ref 8.9–10.3)
Chloride: 92 mmol/L — ABNORMAL LOW (ref 98–111)
Creatinine: 0.7 mg/dL (ref 0.44–1.00)
GFR, Estimated: >60 mL/min (ref >60)
Glucose, Bld: 239 mg/dL — ABNORMAL HIGH (ref 70–99)
Potassium: 3.7 mmol/L (ref 3.5–5.1)
Sodium: 131 mmol/L — ABNORMAL LOW (ref 135–145)
Total Bilirubin: 0.4 mg/dL (ref 0.3–1.2)
Total Protein: 7.4 g/dL (ref 6.5–8.1)

## 2022-07-27 MED ORDER — SODIUM CHLORIDE 0.9% FLUSH
10.0000 mL | Freq: Once | INTRAVENOUS | Status: AC
  Administered 2022-07-27: 10 mL

## 2022-07-27 MED ORDER — HEPARIN SOD (PORK) LOCK FLUSH 100 UNIT/ML IV SOLN
500.0000 [IU] | Freq: Once | INTRAVENOUS | Status: AC
  Administered 2022-07-27: 500 [IU]

## 2022-07-27 NOTE — Progress Notes (Addendum)
Pt came in today for labs for CT scan tomorrow. Pt preferred to leave port accessed, power port placed  with bio patch heparin locked and secured. Informed Pt to ask to remove needle after CT scan. Pt verbalized understanding.

## 2022-07-28 ENCOUNTER — Ambulatory Visit (HOSPITAL_COMMUNITY)
Admission: RE | Admit: 2022-07-28 | Discharge: 2022-07-28 | Disposition: A | Discharge status: Home / Self Care | Payer: Medicare Other | Source: Ambulatory Visit | Attending: Internal Medicine | Admitting: Internal Medicine

## 2022-07-28 DIAGNOSIS — R918 Other nonspecific abnormal finding of lung field: Secondary | ICD-10-CM | POA: Diagnosis not present

## 2022-07-28 DIAGNOSIS — C3490 Malignant neoplasm of unspecified part of unspecified bronchus or lung: Secondary | ICD-10-CM | POA: Diagnosis not present

## 2022-07-28 MED ORDER — IOHEXOL 300 MG/ML  SOLN
75.0000 mL | Freq: Once | INTRAMUSCULAR | Status: AC | PRN
  Administered 2022-07-28: 75 mL via INTRAVENOUS

## 2022-07-28 MED ORDER — HEPARIN SOD (PORK) LOCK FLUSH 100 UNIT/ML IV SOLN
INTRAVENOUS | Status: AC
  Administered 2022-07-28: 500 [IU]
  Filled 2022-07-28: qty 5

## 2022-07-30 ENCOUNTER — Other Ambulatory Visit: Payer: Self-pay

## 2022-07-30 ENCOUNTER — Inpatient Hospital Stay (HOSPITAL_BASED_OUTPATIENT_CLINIC_OR_DEPARTMENT_OTHER): Payer: Medicare Other | Admitting: Internal Medicine

## 2022-07-30 VITALS — BP 114/60 | HR 103 | Temp 97.5°F | Resp 16 | Wt 117.6 lb

## 2022-07-30 DIAGNOSIS — Z79899 Other long term (current) drug therapy: Secondary | ICD-10-CM | POA: Diagnosis not present

## 2022-07-30 DIAGNOSIS — C3490 Malignant neoplasm of unspecified part of unspecified bronchus or lung: Secondary | ICD-10-CM

## 2022-07-30 DIAGNOSIS — Z452 Encounter for adjustment and management of vascular access device: Secondary | ICD-10-CM | POA: Diagnosis not present

## 2022-07-30 DIAGNOSIS — D509 Iron deficiency anemia, unspecified: Secondary | ICD-10-CM | POA: Diagnosis not present

## 2022-07-30 DIAGNOSIS — C3401 Malignant neoplasm of right main bronchus: Secondary | ICD-10-CM | POA: Diagnosis not present

## 2022-07-30 NOTE — Progress Notes (Signed)
[place] [phone]) [phone]   [phone]) [phone]  OFFICE PROGRESS NOTE  Tower, [name], MD [place] [address]  DIAGNOSIS:  1) Limited stage (T3, N2, M0) small cell lung cancer presented with right hilar and mediastinal lymphadenopathy. There was no hypermetabolism in the left adrenal gland lesion. Diagnosed in December 2022. 2)  Iron Deficiency Anemia   PRIOR THERAPY:  1) Systemic chemotherapy with systemic chemotherapy with carboplatin for an AUC of 5, etoposide 100 mg per metered squared, and Imfinzi 1500 mg IV every 3 weeks with Cosela for myeloprotection.  First dose on 10/16/2021.  Discontinued after 1 cycle as the patient staging PET scan showed limited stage disease. 2) systemic chemotherapy with carboplatin for an AUC of 5 on day 1 and etoposide 100 mg/m2 on days 1, 2, and 3 IV every 3 weeks.  First dose on 11/06/2021.  This will be concurrent with radiotherapy under the care of Dr. Sondra Come.  Status post 3 cycles. 3) Venofer infusions as needed. Last dose on 02/07/21    CURRENT THERAPY: Observation   INTERVAL HISTORY: Diane [name] 66 y.o. female returns to the clinic today for follow-up visit.  The patient is feeling fine except for fatigue.  She does not take any iron tablets at this point because she cannot afford it.  She denied having any chest pain, shortness of breath, cough or hemoptysis.  She has no nausea, vomiting, diarrhea or constipation.  She has no headache or visual changes.  She denied having any recent weight loss or night sweats.  She is here today for evaluation with repeat CT scan of the chest for restaging of her disease.  MEDICAL HISTORY: Past Medical History:  Diagnosis Date   Anemia    Arthritis    Carotid stenosis    a. s/p right carotid stent 10/2017.   Chronic systolic CHF (congestive heart failure) (HCC)    Colon polyps    COPD (chronic obstructive pulmonary disease) (HCC)    Coronary artery  disease    post CABG in 3/07 , coronary stents    Depression    after major stroke years ago, no longer treated   Diabetes mellitus    type 2   Dyslipidemia    Fatty liver    Fibromyalgia    GERD (gastroesophageal reflux disease)    Headache    hx of    History of kidney stones    History of radiation therapy    right lung 11/15/2021-12/26/2021  Dr Gery Pray   Hyperlipidemia    Hypertension    Lung cancer [facility])    Myocardial infarction ([place])    Pneumonia    hx of    Seizures ([place])    last seizure- 03/2013    Shortness of breath dyspnea    with exertion or when fluid builds up    Sleep apnea    used to wear a cpap- not used in 3 years    Status post dilation of esophageal narrowing    Stroke [facility]) 1993   problems with balance    Systolic murmur    known mild AS and MR   Thyroid goiter    Tobacco abuse     ALLERGIES:  is allergic to bee venom, benadryl [diphenhydramine], entresto [sacubitril-valsartan], farxiga [dapagliflozin], tetracycline, canagliflozin, morphine sulfate er, sitagliptin, and ace inhibitors.  MEDICATIONS:  Current Outpatient Medications  Medication Sig Dispense Refill   acetaminophen (TYLENOL) 500 MG tablet  Take 1,000 mg by mouth at bedtime.     albuterol (PROVENTIL) (2.5 MG/3ML) 0.083% nebulizer solution Take 3 mLs (2.5 mg total) by nebulization every 6 (six) hours as needed for wheezing or shortness of breath. 1180 mL 3   aspirin EC 81 MG tablet Take 81 mg by mouth every evening.  30 tablet 6   BD PEN NEEDLE NANO 2ND GEN 32G X 4 MM MISC Inject into the skin 3 (three) times daily.     clopidogrel (PLAVIX) 75 MG tablet TAKE 1 TABLET DAILY 90 tablet 3   dexlansoprazole (DEXILANT) 60 MG capsule TAKE 1 CAPSULE DAILY 90 capsule 1   digoxin (LANOXIN) 0.125 MG tablet TAKE 1/2 TABLET DAILY 45 tablet 3   diphenhydrAMINE (BENADRYL) 25 mg capsule Take 25 mg by mouth 2 (two) times daily as needed for itching or allergies.     ezetimibe (ZETIA) 10 MG tablet Take 1  tablet (10 mg total) by mouth daily. PLEASE CALL OFFICE FOR FOLLOW UP APPOINTMENT 90 tablet 3   fluticasone (FLONASE) 50 MCG/ACT nasal spray Place 2 sprays into both nostrils daily. 48 g 3   gabapentin (NEURONTIN) 300 MG capsule TAKE 2 CAPSULES DAILY AT   BEDTIME 180 capsule 1   glucose blood (ONETOUCH ULTRA) test strip 3 (three) times daily.     guaiFENesin (MUCINEX) 600 MG 12 hr tablet Take 2 tablets (1,200 mg total) by mouth 2 (two) times daily. 360 tablet 3   HYDROcodone-acetaminophen (NORCO) 5-325 MG tablet Take 1 tablet by mouth every 6 (six) hours as needed for moderate pain. 30 tablet 0   icosapent Ethyl (VASCEPA) 1 g capsule Take 1 g by mouth 2 (two) times daily.     insulin aspart protamine- aspart (NOVOLOG MIX 70/30) (70-30) 100 UNIT/ML injection Inject 5-65 Units into the skin See admin instructions. 65 units in the Am with breakfast and 5 units with lunch and 55 units with dinner     levETIRAcetam (KEPPRA) 1000 MG tablet Take 1 tablet (1,000 mg total) by mouth 2 (two) times daily. 180 tablet 1   lidocaine-prilocaine (EMLA) cream Apply to the Port-A-Cath site 30-60 minutes before chemotherapy 30 g 0   metFORMIN (GLUCOPHAGE) 500 MG tablet Take 500 mg by mouth 2 (two) times daily with a meal.     metolazone (ZAROXOLYN) 2.5 MG tablet TAKE 1 TABLET TWICE WEEKLY,EVERY MONDAY AND FRIDAY    (CHANGE IN DOSAGE). 26 tablet 3   nitroGLYCERIN (NITROLINGUAL) 0.4 MG/SPRAY spray Place 1 spray under the tongue every 5 (five) minutes x 3 doses as needed for chest pain. 12 g 3   nystatin (MYCOSTATIN/NYSTOP) powder APPLY 1 APPLICATION        TOPICALLY 4 TIMES A DAY AS NEEDED FOR YEAST INFECTIONS 60 g 5   ONE TOUCH ULTRA TEST test strip USE AS DIRECTED FOR TESTING BLOOD GLUCOSE 3 TIMES DAILY  1   polyethylene glycol (MIRALAX / GLYCOLAX) 17 g packet Take 17 g by mouth daily.     potassium chloride (KLOR-CON M) 10 MEQ tablet Take 10 mEq by mouth 2 (two) times daily.     promethazine (PHENERGAN) 12.5 MG tablet  Take 1 tablet (12.5 mg total) by mouth every 6 (six) hours as needed for nausea or vomiting. 30 tablet 2   ranolazine (RANEXA) 1000 MG SR tablet TAKE 1 TABLET TWICE A DAY 180 tablet 3   rosuvastatin (CRESTOR) 20 MG tablet Take 1 tablet (20 mg total) by mouth daily. PLEASE CALL OFFICE FOR FOLLOW UP APPOINTMENT  90 tablet 3   sodium chloride (OCEAN) 0.65 % SOLN nasal spray Place 1 spray into both nostrils as needed for congestion.     spironolactone (ALDACTONE) 25 MG tablet Take 1 tablet (25 mg total) by mouth at bedtime. PLEASE CALL OFFICE FOR FOLLOW UP APPOINTMENT 90 tablet 3   torsemide (DEMADEX) 20 MG tablet Take 3 tablets (60 mg total) by mouth 2 (two) times daily. 220 tablet 6   triamcinolone cream (KENALOG) 0.5 % Apply 1 application topically 3 (three) times daily as needed (rash). To affected areas     VENTOLIN HFA 108 (90 Base) MCG/ACT inhaler INHALE 2 PUFFS EVERY 6 HOURS AS NEEDED FOR WHEEZING/SHORTNESS OF BREATH 18 each 0   VERQUVO 10 MG TABS TAKE 1 TABLET ONCE DAILY 90 tablet 3   No current facility-administered medications for this visit.    SURGICAL HISTORY:  Past Surgical History:  Procedure Laterality Date   ABDOMINAL AORTOGRAM W/LOWER EXTREMITY N/A 04/18/2020   Procedure: ABDOMINAL AORTOGRAM W/LOWER EXTREMITY;  Surgeon: Lorretta Harp, MD;  Location: [facility] [place];  Service: Cardiovascular;  Laterality: N/A;   APPENDECTOMY     BIOPSY  02/03/2019   Procedure: BIOPSY;  Surgeon: Ladene Artist, MD;  Location: [facility] ENDOSCOPY;  Service: Endoscopy;;   BREAST BIOPSY Left 2016   BRONCHIAL BIOPSY  09/18/2021   Procedure: BRONCHIAL BIOPSIES;  Surgeon: Collene Gobble, MD;  Location: [facility] ENDOSCOPY;  Service: Pulmonary;;   BRONCHIAL BRUSHINGS  09/18/2021   Procedure: BRONCHIAL BRUSHINGS;  Surgeon: Collene Gobble, MD;  Location: [facility] ENDOSCOPY;  Service: Pulmonary;;   BRONCHIAL NEEDLE ASPIRATION BIOPSY  09/18/2021   Procedure: BRONCHIAL NEEDLE ASPIRATION BIOPSIES;  Surgeon: Collene Gobble, MD;  Location: [facility] ENDOSCOPY;  Service: Pulmonary;;   CARDIAC CATHETERIZATION  11/29/05   EF of 55%   CARDIAC CATHETERIZATION  08/06/06   EF of 45-50%   CARDIAC CATHETERIZATION N/A 05/06/2015   Procedure: Right/Left Heart Cath and Coronary/Graft Angiography;  Surgeon: Peter M Martinique, MD;  Location: [place] CV [facility];  Service: Cardiovascular;  Laterality: N/A;   CAROTID PTA/STENT INTERVENTION N/A 10/10/2017   Procedure: CAROTID PTA/STENT INTERVENTION - Right;  Surgeon: Serafina Mitchell, MD;  Location: [place] CV LAB;  Service: Cardiovascular;  Laterality: N/A;   CERVICAL FUSION  1990   CHOLECYSTECTOMY     COLON RESECTION     mass removed and 4 in of colon   COLONOSCOPY WITH PROPOFOL N/A 07/16/2017   Procedure: COLONOSCOPY WITH PROPOFOL;  Surgeon: Ladene Artist, MD;  Location: [facility];  Service: Endoscopy;  Laterality: N/A;   CORONARY ARTERY BYPASS GRAFT  12/04/2005   x5 -- left internal mammary artery to the LAD, left radial artery to the ramus intermedius, saphenous vein graft to the obtuse marginal 1, sequential saphenous vein grat to the acute marginal and posterior descending, endoscopic vein harvesting from the left thigh with open vein harvest from right leg   CORONARY STENT PLACEMENT  08/11/06   PCI of her ciurcumflex/OM vessel   ESOPHAGOGASTRODUODENOSCOPY (EGD) WITH PROPOFOL N/A 02/03/2019   Procedure: ESOPHAGOGASTRODUODENOSCOPY (EGD) WITH PROPOFOL;  Surgeon: Ladene Artist, MD;  Location: [facility];  Service: Endoscopy;  Laterality: N/A;   FINE NEEDLE ASPIRATION  09/18/2021   Procedure: FINE NEEDLE ASPIRATION (FNA) LINEAR;  Surgeon: Collene Gobble, MD;  Location: [facility] ENDOSCOPY;  Service: Pulmonary;;   ICD IMPLANT N/A 07/15/2020   Procedure: ICD IMPLANT;  Surgeon: Vickie Epley, MD;  Location: [place] [facility] LAB;  Service:  Cardiovascular;  Laterality: N/A;   IR IMAGING GUIDED PORT INSERTION  10/20/2021   LAPAROTOMY Bilateral 05/19/2015   Procedure: EXPLORATORY  LAPAROTOMY WITH BILATERAL SALPINGO OOPHORECTOMY /OMENTECTOMY/SEGMENTAL SIGMOID COLECTOMY ;  Surgeon: Everitt Amber, MD;  Location: [facility]S;  Service: Gynecology;  Laterality: Bilateral;   PERIPHERAL VASCULAR BALLOON ANGIOPLASTY  04/18/2020   Procedure: PERIPHERAL VASCULAR BALLOON ANGIOPLASTY;  Surgeon: Lorretta Harp, MD;  Location: [place] [facility];  Service: Cardiovascular;;  rt SFA  atherectomy and DCB   PERIPHERAL VASCULAR CATHETERIZATION N/A 11/15/2015   Procedure: Carotid PTA/Stent Intervention;  Surgeon: Lorretta Harp, MD;  Location: [place] [facility];  Service: Cardiovascular;  Laterality: N/A;   PERIPHERAL VASCULAR CATHETERIZATION  11/15/2015   Procedure: Carotid Angiography;  Surgeon: Lorretta Harp, MD;  Location: [place] [facility];  Service: Cardiovascular;;   RIGHT HEART CATH N/A 01/27/2020   Procedure: RIGHT HEART CATH;  Surgeon: Larey Dresser, MD;  Location: [place] [facility];  Service: Cardiovascular;  Laterality: N/A;   VIDEO BRONCHOSCOPY WITH ENDOBRONCHIAL ULTRASOUND N/A 09/18/2021   Procedure: VIDEO BRONCHOSCOPY WITH ENDOBRONCHIAL ULTRASOUND;  Surgeon: Collene Gobble, MD;  Location: [place] ENDOSCOPY;  Service: Pulmonary;  Laterality: N/A;   VIDEO BRONCHOSCOPY WITH RADIAL ENDOBRONCHIAL ULTRASOUND  09/18/2021   Procedure: RADIAL ENDOBRONCHIAL ULTRASOUND;  Surgeon: Collene Gobble, MD;  Location: [facility] ENDOSCOPY;  Service: Pulmonary;;    REVIEW OF SYSTEMS:  A comprehensive review of systems was negative except for: Constitutional: positive for fatigue   PHYSICAL EXAMINATION: General appearance: alert, cooperative, appears stated age, fatigued, and no distress Head: Normocephalic, without obvious abnormality, atraumatic Neck: no adenopathy, no JVD, supple, symmetrical, trachea midline, and thyroid not enlarged, symmetric, no tenderness/mass/nodules Lymph nodes: Cervical, supraclavicular, and axillary nodes normal. Resp: clear to auscultation bilaterally Back: symmetric, no  curvature. ROM normal. No CVA tenderness. Cardio: regular rate and rhythm, S1, S2 normal, no murmur, click, rub or gallop GI: soft, non-tender; bowel sounds normal; no masses,  no organomegaly Extremities: extremities normal, atraumatic, no cyanosis or edema  ECOG PERFORMANCE STATUS: 1 - Symptomatic but completely ambulatory  Blood pressure 114/60, pulse (!) 103, temperature (!) 97.5 F (36.4 C), temperature source Oral, resp. rate 16, weight 117 lb 9 oz (53.3 kg), last menstrual period 09/02/2003, SpO2 100 %.  LABORATORY DATA: Lab Results  Component Value Date   WBC 7.9 07/27/2022   HGB 9.6 (L) 07/27/2022   HCT 31.7 (L) 07/27/2022   MCV 75.1 (L) 07/27/2022   PLT 255 07/27/2022      Chemistry      Component Value Date/Time   NA 131 (L) 07/27/2022 1441   NA 135 04/14/2020 1544   NA 135 (L) 02/15/2017 1457   K 3.7 07/27/2022 1441   K 4.8 02/15/2017 1457   CL 92 (L) 07/27/2022 1441   CO2 30 07/27/2022 1441   CO2 26 02/15/2017 1457   BUN 15 07/27/2022 1441   BUN 17 04/14/2020 1544   BUN 10.8 02/15/2017 1457   CREATININE 0.70 07/27/2022 1441   CREATININE 0.69 05/06/2017 1405   CREATININE 0.7 02/15/2017 1457      Component Value Date/Time   CALCIUM 9.4 07/27/2022 1441   CALCIUM 9.3 02/15/2017 1457   ALKPHOS 61 07/27/2022 1441   ALKPHOS 75 02/15/2017 1457   AST 10 (L) 07/27/2022 1441   AST 9 02/15/2017 1457   ALT 7 07/27/2022 1441   ALT 13 02/15/2017 1457   BILITOT 0.4 07/27/2022 1441   BILITOT 0.28 02/15/2017 1457  RADIOGRAPHIC STUDIES: CUP PACEART REMOTE DEVICE CHECK  Result Date: 07/24/2022 Scheduled remote reviewed. Normal device function.  5 NSVT, short Next remote 91 days- JJB  VAS US CAROTID  Result Date: 07/20/2022 Carotid Arterial Duplex Study Patient Name:  LILAC HOFF  Date of Exam:   07/18/2022 Medical Rec #: [id number]       Accession #:    [id number] Date of Birth: 1956/08/12       Patient Gender: F Patient Age:   30 years Exam Location:   [facility] Procedure:      VAS US CAROTID Referring Phys: Roderic Palau BERRY --------------------------------------------------------------------------------  Indications:       Carotid artery disease, Right stent and Patient denies any                    cerebrovascular symptoms today. Risk Factors:      Hypertension, hyperlipidemia, Diabetes, current smoker,                    coronary artery disease, prior CVA, PAD. Other Factors:     History of CABG and OSA                     Right ICA stent placed 10/2017. Comparison Study:  Prior carotid duplex exam on 03/08/21 showed highest velocities                    in right ICA stent 123 cm/s and left proximal ICA 273/53                    cm/s. Performing Technologist: Salvadore Dom RVT, RDCS (AE), RDMS  Examination Guidelines: A complete evaluation includes B-mode imaging, spectral Doppler, color Doppler, and power Doppler as needed of all accessible portions of each vessel. Bilateral testing is considered an integral part of a complete examination. Limited examinations for reoccurring indications may be performed as noted.  Right Carotid Findings: +----------+--------+--------+--------+--------------------+-------------------+           PSV cm/sEDV cm/sStenosisPlaque Description  Comments            +----------+--------+--------+--------+--------------------+-------------------+ CCA Prox  85      16                                                      +----------+--------+--------+--------+--------------------+-------------------+ CCA Mid   84      22      <50%    diffuse, calcific                                                         and irregular                           +----------+--------+--------+--------+--------------------+-------------------+ CCA Distal68      11              diffuse and calcific                    +----------+--------+--------+--------+--------------------+-------------------+ ICA Prox  SEE STENT WORKSHEET +----------+--------+--------+--------+--------------------+-------------------+ ICA Distal92      35                                                      +----------+--------+--------+--------+--------------------+-------------------+ ECA       218     12      >50%                                            +----------+--------+--------+--------+--------------------+-------------------+ +----------+--------+-------+----------------+-------------------+           PSV cm/sEDV cmsDescribe        Arm Pressure (mmHG) +----------+--------+-------+----------------+-------------------+ [id number]             Multiphasic, [id number]                 +----------+--------+-------+----------------+-------------------+ +---------+--------+--+--------+--+---------+ VertebralPSV cm/s71EDV cm/s17Antegrade +---------+--------+--+--------+--+---------+  Right Stent(s): +---------------+--------+--------+-------------+----------+-------------------+ DST CCA-MID ICAPSV cm/sEDV cm/sStenosis     Waveform  Comments            +---------------+--------+--------+-------------+----------+-------------------+ Prox to Stent  83      21                   monophasic                    +---------------+--------+--------+-------------+----------+-------------------+ Proximal Stent 122     30      <50% stenosismonophasicturbulent flow with                                                       shadowing noted     +---------------+--------+--------+-------------+----------+-------------------+ Mid Stent      92      26                   monophasic                    +---------------+--------+--------+-------------+----------+-------------------+ Distal Stent   112     28      <50% stenosismonophasic                    +---------------+--------+--------+-------------+----------+-------------------+ Distal to Stent91      27                    monophasic                    +---------------+--------+--------+-------------+----------+-------------------+   Left Carotid Findings: +----------+--------+--------+--------+--------------------+------------------+           PSV cm/sEDV cm/sStenosisPlaque Description  Comments           +----------+--------+--------+--------+--------------------+------------------+ CCA Prox  95      13                                                     +----------+--------+--------+--------+--------------------+------------------+ CCA Mid   81      16  intimal thickening +----------+--------+--------+--------+--------------------+------------------+ CCA Distal90      15              diffuse and calcificintimal thickening +----------+--------+--------+--------+--------------------+------------------+ ICA Prox  268     64      60-79%  calcific            Shadowing          +----------+--------+--------+--------+--------------------+------------------+ ICA Mid   105     34                                  tortuous           +----------+--------+--------+--------+--------------------+------------------+ ICA Distal86      28                                                     +----------+--------+--------+--------+--------------------+------------------+ ECA       300     24      >50%    calcific            shadowing          +----------+--------+--------+--------+--------------------+------------------+ +----------+--------+--------+----------------+-------------------+           PSV cm/sEDV cm/sDescribe        Arm Pressure (mmHG) +----------+--------+--------+----------------+-------------------+ [id number]             Multiphasic, [id number]                 +----------+--------+--------+----------------+-------------------+ +---------+--------+--+--------+--+---------+ VertebralPSV cm/s95EDV cm/s18Antegrade  +---------+--------+--+--------+--+---------+   Summary: Right Carotid: Non-hemodynamically significant plaque <50% noted in the CCA. The                ECA appears >50% stenosed. Stable distal CCA-mid ICA < 50%                in-stent stenosis. Left Carotid: Velocities in the left ICA are consistent now with a 60-79%               stenosis. Non-hemodynamically significant plaque <50% noted in the               CCA. The ECA appears >50% stenosed. Vertebrals:  Bilateral vertebral arteries demonstrate antegrade flow. Subclavians: Normal flow hemodynamics were seen in bilateral subclavian              arteries. *See table(s) above for measurements and observations. Suggest follow up study in 12 months. Electronically signed by Carlyle Dolly MD on 07/20/2022 at 9:51:54 AM.    Final     ASSESSMENT AND PLAN: This is a very pleasant 66 years old white female recently diagnosed with Limited stage (T3, N2, M0) small cell lung cancer presented with right hilar and mediastinal lymphadenopathy. There was no hypermetabolism in the left adrenal gland lesion. Diagnosed in December 2022. She initially started treatment with carboplatin, etoposide, Cosela and Imfinzi with the expectation that she had extensive stage disease status post 1 cycle. Staging work-up with PET scan showed no hypermetabolic activity and the suspicious left adrenal gland lesion. Her treatment was switched to chemotherapy with with carboplatin for AUC of 5 on day 1, etoposide 100 Mg/M2 on days 1, 2 and 3 every 3 weeks concurrent with radiation.  First dose today 11/06/2021.  Status post 3 cycles. She tolerated her treatment well with no  concerning adverse effect except for fatigue as well as radiation-induced esophagitis with dysphagia and odynophagia. The patient declined prophylactic cranial irradiation. The patient is currently on observation with no concerning complaints except for the fatigue. She had repeat CT scan of the chest performed 2 days  ago but the final report is still pending.  I personally and independently reviewed the scan images and I do not see any clear evidence for disease progression but I will wait for the final report for confirmation. If the scan showed no concerning finding for disease recurrence or metastasis, I will see her back for follow-up visit in 3 months with repeat CT scan of the chest for restaging of her disease. For the iron deficiency anemia, I strongly encouraged the patient to start taking over-the-counter oral iron tablet at regular basis. She was advised to call immediately if she has any other concerning symptoms in the interval. The patient voices understanding of current disease status and treatment options and is in agreement with the current care plan.  All questions were answered. The patient knows to call the clinic with any problems, questions or concerns. We can certainly see the patient much sooner if necessary. The total time spent in the appointment was 20 minutes.  Disclaimer: This note was dictated with voice recognition software. Similar sounding words can inadvertently be transcribed and may not be corrected upon review.

## 2022-07-31 ENCOUNTER — Other Ambulatory Visit: Payer: Self-pay | Admitting: Internal Medicine

## 2022-07-31 DIAGNOSIS — C3490 Malignant neoplasm of unspecified part of unspecified bronchus or lung: Secondary | ICD-10-CM

## 2022-07-31 DIAGNOSIS — I110 Hypertensive heart disease with heart failure: Secondary | ICD-10-CM | POA: Diagnosis not present

## 2022-07-31 NOTE — Progress Notes (Signed)
This encounter was created in error - please disregard.

## 2022-08-13 NOTE — Progress Notes (Signed)
Remote ICD transmission.   

## 2022-08-16 ENCOUNTER — Ambulatory Visit (HOSPITAL_COMMUNITY)
Admission: RE | Admit: 2022-08-16 | Discharge: 2022-08-16 | Disposition: A | Discharge status: Home / Self Care | Payer: Medicare Other | Source: Ambulatory Visit | Attending: Family Medicine | Admitting: Family Medicine

## 2022-08-16 ENCOUNTER — Encounter (HOSPITAL_COMMUNITY): Payer: Self-pay

## 2022-08-16 ENCOUNTER — Other Ambulatory Visit (HOSPITAL_COMMUNITY): Payer: Self-pay

## 2022-08-16 VITALS — BP 114/68 | HR 97 | Wt 114.6 lb

## 2022-08-16 DIAGNOSIS — I25708 Atherosclerosis of coronary artery bypass graft(s), unspecified, with other forms of angina pectoris: Secondary | ICD-10-CM

## 2022-08-16 DIAGNOSIS — I951 Orthostatic hypotension: Secondary | ICD-10-CM | POA: Insufficient documentation

## 2022-08-16 DIAGNOSIS — C3490 Malignant neoplasm of unspecified part of unspecified bronchus or lung: Secondary | ICD-10-CM | POA: Diagnosis not present

## 2022-08-16 DIAGNOSIS — I081 Rheumatic disorders of both mitral and tricuspid valves: Secondary | ICD-10-CM | POA: Diagnosis not present

## 2022-08-16 DIAGNOSIS — Z7902 Long term (current) use of antithrombotics/antiplatelets: Secondary | ICD-10-CM | POA: Insufficient documentation

## 2022-08-16 DIAGNOSIS — Z95810 Presence of automatic (implantable) cardiac defibrillator: Secondary | ICD-10-CM | POA: Insufficient documentation

## 2022-08-16 DIAGNOSIS — I255 Ischemic cardiomyopathy: Secondary | ICD-10-CM | POA: Diagnosis not present

## 2022-08-16 DIAGNOSIS — I70219 Atherosclerosis of native arteries of extremities with intermittent claudication, unspecified extremity: Secondary | ICD-10-CM | POA: Insufficient documentation

## 2022-08-16 DIAGNOSIS — I110 Hypertensive heart disease with heart failure: Secondary | ICD-10-CM | POA: Diagnosis not present

## 2022-08-16 DIAGNOSIS — Z720 Tobacco use: Secondary | ICD-10-CM | POA: Diagnosis not present

## 2022-08-16 DIAGNOSIS — D509 Iron deficiency anemia, unspecified: Secondary | ICD-10-CM | POA: Insufficient documentation

## 2022-08-16 DIAGNOSIS — I739 Peripheral vascular disease, unspecified: Secondary | ICD-10-CM

## 2022-08-16 DIAGNOSIS — Z7982 Long term (current) use of aspirin: Secondary | ICD-10-CM | POA: Insufficient documentation

## 2022-08-16 DIAGNOSIS — Z951 Presence of aortocoronary bypass graft: Secondary | ICD-10-CM | POA: Diagnosis not present

## 2022-08-16 DIAGNOSIS — Z955 Presence of coronary angioplasty implant and graft: Secondary | ICD-10-CM | POA: Insufficient documentation

## 2022-08-16 DIAGNOSIS — Z79899 Other long term (current) drug therapy: Secondary | ICD-10-CM | POA: Insufficient documentation

## 2022-08-16 DIAGNOSIS — I5022 Chronic systolic (congestive) heart failure: Secondary | ICD-10-CM | POA: Diagnosis not present

## 2022-08-16 DIAGNOSIS — F17210 Nicotine dependence, cigarettes, uncomplicated: Secondary | ICD-10-CM | POA: Diagnosis not present

## 2022-08-16 DIAGNOSIS — I6529 Occlusion and stenosis of unspecified carotid artery: Secondary | ICD-10-CM | POA: Diagnosis not present

## 2022-08-16 DIAGNOSIS — I2510 Atherosclerotic heart disease of native coronary artery without angina pectoris: Secondary | ICD-10-CM | POA: Diagnosis not present

## 2022-08-16 DIAGNOSIS — I6523 Occlusion and stenosis of bilateral carotid arteries: Secondary | ICD-10-CM

## 2022-08-16 MED ORDER — ALBUTEROL SULFATE HFA 108 (90 BASE) MCG/ACT IN AERS: INHALATION_SPRAY | RESPIRATORY_TRACT | 0 refills | Status: DC

## 2022-08-16 NOTE — Patient Instructions (Signed)
It was great to see you today! No medication changes are needed at this time.   Your physician has requested that you have an echocardiogram. Echocardiography is a painless test that uses sound waves to create images of your heart. It provides your doctor with information about the size and shape of your heart and how well your heart's chambers and valves are working. This procedure takes approximately one hour. There are no restrictions for this procedure. Please do NOT wear cologne, perfume, aftershave, or lotions (deodorant is allowed). Please arrive 15 minutes prior to your appointment time.   Your physician wants you to follow-up in: 4 months with Dr Kendall Flack will receive a reminder letter in the mail two months in advance. If you don't receive a letter, please call our office to schedule the follow-up appointment.    Do the following things EVERYDAY: Weigh yourself in the morning before breakfast. Write it down and keep it in a log. Take your medicines as prescribed Eat low salt foods--Limit salt (sodium) to 2000 mg per day.  Stay as active as you can everyday Limit all fluids for the day to less than 2 liters   At the [facility], you and your health needs are our priority. As part of our continuing mission to provide you with exceptional heart care, we have created designated Provider Care Teams. These Care Teams include your primary Cardiologist (physician) and Advanced Practice Providers (APPs- Physician Assistants and Nurse Practitioners) who all work together to provide you with the care you need, when you need it.   You may see any of the following providers on your designated Care Team at your next follow up: Dr Glori Bickers Dr Loralie Champagne Dr. Roxana Hires, NP Lyda Jester, [place] [facility] [place], [place] Forestine Na, NP Audry Riles, PharmD   Please be sure to bring in all your medications bottles to every  appointment.   If you have any questions or concerns before your next appointment please send [place] a message through [place] or call our office at [phone].    TO LEAVE A MESSAGE FOR THE NURSE SELECT OPTION 2, PLEASE LEAVE A MESSAGE INCLUDING: YOUR NAME DATE OF BIRTH CALL BACK NUMBER REASON FOR CALL**this is important as we prioritize the call backs  YOU WILL RECEIVE A CALL BACK THE SAME DAY AS LONG AS YOU CALL BEFORE 4:00 PM

## 2022-08-16 NOTE — Progress Notes (Signed)
PCP: [name], MD Cardiology: Dr. Martinique HF Cardiology: Dr. Aundra Dubin  66 y.o. with history of CAD, ischemic cardiomyopathy, CVA, and valvular disease was referred by Dr. Martinique for CHF evaluation.  Patient has an extensive history of vascular disease.  She had CABG in 2007.  Repeat LHC in 2007 showed all grafts closed except LIMA-LAD.  Patient had DES to LCx/OM.  [place] 2016 showed occlusion of DES to LCx/OM with L-L and L-R collaterals. Poor targets for redo CABG.   In 2/21, echo was done showing EF 20-25% with moderate RV dysfunction, moderate-severe MR, moderate TR.  Despite smoking history, PFTs in 2019 looked relatively normal. She additionally had the placement of a carotid stent on the right in 1/19.    RHC was done in 4/21, showing near-normal filling pressures with CI 2.12 by Fick.  CPX in 5/21 showed peak VO2 13, VE/VCO2 slope 37 => moderate HF limitation.  The PFTs on the CPX were not markedly abnormal. She saw Dr. Gwenlyn Found recently, peripheral arterial dopplers in 4/21 showed significant stenosis right SFA.   We worked her up for LVAD.  She was presented at [facility] and saw Dr. Prescott Gum.  He was concerned by her small stature and small LV cavity. Concern that Heartmate 3 would not function well in her small LV, and Heartware LVAD that was thought to be more suitable for small patients has been discontinued.   We considered her for a barostimulation trial, but she does not qualify for either ANTHEM or Batwire due to carotid disease.   She is off dapagliflozin due to recurrrent GU yeast infections.  In 7/21, she had atherectomy of right SFA.  [place] placed in 10/21.   As she was turned down for LVAD at [facility], Dr. Aundra Dubin had her seen at [facility]. She underwent workup there with echo in 11/21 showing higher EF 35%. RHC, however, showed low CI 1.8.    Echo in 5/22 showed improvement in EF, now up to 45-50% with basal-mid inferolateral and anterolateral akinesis, normal RV.    Underwent bronchoscopy 12/19 for pulmonary nodules, suspicious for malignancy.  Follow up 12/22, NYHA II-III symptoms, mildly volume overloaded and torsemide increased to 60 bid.  Unfortunately diagnosed with small cell lung cancer 12/22 and has had treatment with chemo and radiation.   Follow up 9/23, volume up and Lasix increased to 60 bid. Arranged for repeat echo.  Today she returns for HF follow up. Overall feeling fair. She walks with a walker, no dyspnea walking short distances on flat ground. She is chronically fatigued. Rare atypical chest pain. Smokes 1/2 ppd. Denies palpitations, abnormal bleeding, CP, dizziness, edema, or PND/Orthopnea. Appetite ok. No fever or chills. Weight at home 114 pounds. Taking all medications. Awaiting repeat PET scan as her recent CT showed changes concerning for progression of cancer.  ECG (personally reviewed): none ordered today.  [employer] device interrogation: Heartlogic score 4, stable thoracic impedence, average HR 103, 0.9 hr/day acitivity (personally reviewed).               Labs (4/21): K 3.7 => 3.4, creatinine 0.78 => 0.7, hgb 11.3 => 14.4 Labs (5/21): digoxin 0.6, K 3.5, creatinine 0.63 Labs (6/21): K 4.1, creatinine 0.7, LDL 61, TGs 247 Labs (7/21): K 5, creatinine 0.76, hgb 16.9.  Labs (10/21): K 4.6, creatinine 0.86 Labs (11/21): digoxin 0.9, LDL 45, HDL 27, K 4, creatinine 0.86 Labs (4/22): hgb 14.4, K 4.9, creatinine 0.81, digoxin 0.7 Labs (9/22): hgb 14.5,  K 4.9, creatinine 0.8, LDL 38 Labs (4/23): K 4.3, creatinine 0.56  Labs (7/23): hgb 11.1, K 4, creatinine 0.87 Labs (9/23): LDL 34, HDL 33, TGs 186 Labs (10/23): K 3.7, creatinine 0.70  PMH: 1. CAD: CABG 2007.  - LHC in 2007 showed all grafts closed except LIMA-LAD.  Patient had DES to LCx/OM.   - [place] 2016 showed occlusion of DES to LCx/OM with L-L and L-R collaterals.  - Not candidate for redo CABG with poor targets.  2. Chronic systolic CHF: Ischemic  cardiomyopathy.   - Echo (2/21): EF 20-25%, moderately dilated and moderately dysfunctional RV, PASP 49, severe biatrial enlargement, moderate-severe MR, severe TR, mild-moderate AS, IVC dilated.  - RHC (4/21): mean RA 6, PA 41/16, mean 29, mean PCWP 15, CI 2.12, PVR 4.3 WU - CPX (5/21): peak VO2 13, VE/VCO2 slope 37, RER 1.06 => moderate HF limitation.  - Echo (11/21): EF 35%, mild LVH.  - RHC (11/21): baseline CI 1.8, exercise peak VO2 12.2, PCWP up to 24 with exercise.  - Echo (5/22): EF 45-50%, basal-mid inferolateral and anterolateral akinesis, normal RV.  3. Fe deficiency anemia: FOBT negative.  4. Carotid stenosis: Right carotid stent in 1/19.  - Carotid dopplers (3/01): 60-10% LICA stenosis.  - Carotid dopplers (9/32): 35-57% LICA stenosis.  - Carotid dopplers (3/22): 02-54% LICA stenosis.  5. H/o CVA 6. HTN 7. Type 2 diabetes 8. Hyperlipidemia 9. Seizure disorder 10. OSA: Cannot tolerate CPAP.  11. Active smoker: PFTs in 2019 were relatively normal.  She does use oxygen at night.  - PFTs (5/21): FEV1 82%, FVC 80%, ratio 102% => minimal obstruction.  12. PAD: ABIs (4/21) 0.76 on right, 0.86 on left => significant right SFA stenosis.  - Atherectomy/angioplasty right SFA.  - Peripheral arterial dopplers (2/22): 50-74% stenosis right SFA.  13. Chronic penetrating atherosclerotic ulcer descending thoracic aorta.  14. Small cell lung cancer: Diagnosed 12/22. Treated with carboplatin and etoposide, and radiation therapy. 15. Esophageal stricture with dilation  Social History   Socioeconomic History   Marital status: Divorced    Spouse name: Not on file   Number of children: 0   Years of education: Not on file   Highest education level: Not on file  Occupational History   Occupation: disabled  Tobacco Use   Smoking status: Every Day    Packs/day: 1.00    Years: 51.00    Total pack years: 51.00    Types: Cigarettes    Start date: 48    Last attempt to quit: 02/22/2020     Years since quitting: 2.4   Smokeless tobacco: Never   Tobacco comments:    Pt smokes a 1 pack a day 08/30/2021 / pl  Vaping Use   Vaping Use: Former  Substance and Sexual Activity   Alcohol use: No   Drug use: No   Sexual activity: Not Currently    Partners: Male    Birth control/protection: None  Other Topics Concern   Not on file  Social History Narrative   Right handed   Lives alone in a one story home   Social Determinants of Health   Financial Resource Strain: Low Risk  (04/20/2019)   Overall Financial Resource Strain (CARDIA)    Difficulty of Paying Living Expenses: Not hard at all  Food Insecurity: No Food Insecurity (09/13/2020)   Hunger Vital Sign    Worried About Running Out of Food in the Last Year: Never true    [place] in  the Last Year: Never true  Transportation Needs: Unmet Transportation Needs (09/13/2020)   PRAPARE - Hydrologist (Medical): Yes    Lack of Transportation (Non-Medical): No  Physical Activity: Insufficiently Active (04/20/2019)   Exercise Vital Sign    Days of Exercise per Week: 2 days    Minutes of Exercise per Session: 60 min  Stress: No Stress Concern Present (04/20/2019)   [place]    Feeling of Stress : Not at all  Social Connections: Not on file  Intimate Partner Violence: Not At Risk (04/20/2019)   Humiliation, Afraid, Rape, and Kick questionnaire    Fear of Current or Ex-Partner: No    Emotionally Abused: No    Physically Abused: No    Sexually Abused: No   Family History  Problem Relation Age of Onset   Heart disease Mother    Diabetes Mother    COPD Mother    Hyperlipidemia Mother    Hypertension Mother    Cancer Father        met, origin unknown   Heart attack Father    Hypertension Sister    Cancer Sister        eyelid   Glaucoma Sister    Heart disease Maternal Grandfather    Drug abuse Paternal Grandmother     Stroke Paternal Grandfather    Heart disease Maternal Aunt        x 2 aunts   Lung cancer Paternal Aunt        lung with mets to brain   Melanoma Paternal Uncle    Lung cancer Paternal Uncle        lung/liver to brain   Cancer Paternal Uncle        cancer of unknown type   Colon cancer Neg Hx    Stomach cancer Neg Hx    Esophageal cancer Neg Hx    ROS: All systems reviewed and negative except as per HPI.   Current Outpatient Medications  Medication Sig Dispense Refill   acetaminophen (TYLENOL) 500 MG tablet Take 1,000 mg by mouth at bedtime.     albuterol (PROVENTIL) (2.5 MG/3ML) 0.083% nebulizer solution Take 3 mLs (2.5 mg total) by nebulization every 6 (six) hours as needed for wheezing or shortness of breath. 1180 mL 3   aspirin EC 81 MG tablet Take 81 mg by mouth every evening.  30 tablet 6   BD PEN NEEDLE NANO 2ND GEN 32G X 4 MM MISC Inject into the skin 3 (three) times daily.     clopidogrel (PLAVIX) 75 MG tablet TAKE 1 TABLET DAILY 90 tablet 3   dexlansoprazole (DEXILANT) 60 MG capsule TAKE 1 CAPSULE DAILY 90 capsule 1   digoxin (LANOXIN) 0.125 MG tablet TAKE 1/2 TABLET DAILY 45 tablet 3   diphenhydrAMINE (BENADRYL) 25 mg capsule Take 25 mg by mouth 2 (two) times daily as needed for itching or allergies.     ezetimibe (ZETIA) 10 MG tablet Take 1 tablet (10 mg total) by mouth daily. PLEASE CALL OFFICE FOR FOLLOW UP APPOINTMENT 90 tablet 3   fluticasone (FLONASE) 50 MCG/ACT nasal spray Place 2 sprays into both nostrils daily. 48 g 3   gabapentin (NEURONTIN) 300 MG capsule TAKE 2 CAPSULES DAILY AT   BEDTIME 180 capsule 1   glucose blood (ONETOUCH ULTRA) test strip 3 (three) times daily.     guaiFENesin (MUCINEX) 600 MG 12 hr tablet Take 2 tablets (1,200 mg  total) by mouth 2 (two) times daily. 360 tablet 3   icosapent Ethyl (VASCEPA) 1 g capsule Take 1 g by mouth 2 (two) times daily.     insulin aspart protamine- aspart (NOVOLOG MIX 70/30) (70-30) 100 UNIT/ML injection Inject 5-65  Units into the skin See admin instructions. 65 units in the Am with breakfast and 5 units with lunch and 55 units with dinner     levETIRAcetam (KEPPRA) 1000 MG tablet Take 1 tablet (1,000 mg total) by mouth 2 (two) times daily. 180 tablet 1   lidocaine-prilocaine (EMLA) cream Apply to the Port-A-Cath site 30-60 minutes before chemotherapy 30 g 0   metFORMIN (GLUCOPHAGE) 500 MG tablet Take 500 mg by mouth 2 (two) times daily with a meal.     metolazone (ZAROXOLYN) 2.5 MG tablet TAKE 1 TABLET TWICE WEEKLY,EVERY MONDAY AND FRIDAY    (CHANGE IN DOSAGE). 26 tablet 3   nitroGLYCERIN (NITROLINGUAL) 0.4 MG/SPRAY spray Place 1 spray under the tongue every 5 (five) minutes x 3 doses as needed for chest pain. 12 g 3   nystatin (MYCOSTATIN/NYSTOP) powder APPLY 1 APPLICATION        TOPICALLY 4 TIMES A DAY AS NEEDED FOR YEAST INFECTIONS 60 g 5   ONE TOUCH ULTRA TEST test strip USE AS DIRECTED FOR TESTING BLOOD GLUCOSE 3 TIMES DAILY  1   polyethylene glycol (MIRALAX / GLYCOLAX) 17 g packet Take 17 g by mouth daily.     potassium chloride (KLOR-CON M) 10 MEQ tablet Take 10 mEq by mouth 2 (two) times daily.     promethazine (PHENERGAN) 12.5 MG tablet Take 1 tablet (12.5 mg total) by mouth every 6 (six) hours as needed for nausea or vomiting. 30 tablet 2   ranolazine (RANEXA) 1000 MG SR tablet TAKE 1 TABLET TWICE A DAY 180 tablet 3   rosuvastatin (CRESTOR) 20 MG tablet Take 1 tablet (20 mg total) by mouth daily. PLEASE CALL OFFICE FOR FOLLOW UP APPOINTMENT 90 tablet 3   sodium chloride (OCEAN) 0.65 % SOLN nasal spray Place 1 spray into both nostrils as needed for congestion.     spironolactone (ALDACTONE) 25 MG tablet Take 1 tablet (25 mg total) by mouth at bedtime. PLEASE CALL OFFICE FOR FOLLOW UP APPOINTMENT 90 tablet 3   torsemide (DEMADEX) 20 MG tablet Take 3 tablets (60 mg total) by mouth 2 (two) times daily. 220 tablet 6   triamcinolone cream (KENALOG) 0.5 % Apply 1 application topically 3 (three) times daily  as needed (rash). To affected areas     VENTOLIN HFA 108 (90 Base) MCG/ACT inhaler INHALE 2 PUFFS EVERY 6 HOURS AS NEEDED FOR WHEEZING/SHORTNESS OF BREATH 18 each 0   VERQUVO 10 MG TABS TAKE 1 TABLET ONCE DAILY 90 tablet 3   HYDROcodone-acetaminophen (NORCO) 5-325 MG tablet Take 1 tablet by mouth every 6 (six) hours as needed for moderate pain. (Patient not taking: Reported on 08/16/2022) 30 tablet 0   No current facility-administered medications for this encounter.   Wt Readings from Last 3 Encounters:  08/16/22 52 kg (114 lb 9.6 oz)  07/30/22 53.3 kg (117 lb 9 oz)  06/18/22 54.3 kg (119 lb 9.6 oz)   BP 114/68   Pulse 97   Wt 52 kg (114 lb 9.6 oz)   LMP 09/02/2003   SpO2 99%   BMI 24.80 kg/m  Physical Exam General:  NAD. No resp difficulty, walked into clinic with rolling walker, chronically-ill appearing. HEENT: Normal Neck: Supple. No JVD. Carotids 2+ bilat;  no bruits. No lymphadenopathy or thryomegaly appreciated. Cor: PMI nondisplaced. Regular rate & rhythm. No rubs, gallops or murmurs. Lungs: diminished in bases. Abdomen: Soft, nontender, nondistended. No hepatosplenomegaly. No bruits or masses. Good bowel sounds. Extremities: No cyanosis, clubbing, rash, edema Neuro: Alert & oriented x 3, cranial nerves grossly intact. Moves all 4 extremities w/o difficulty. Affect pleasant.  Assessment/Plan:  1. Chronic systolic CHF: Ischemic cardiomyopathy, [employer] ICD.  Echo in 2/21 showed EF 20-25%, moderately dilated and moderately dysfunctional RV, PASP 49, severe biatrial enlargement, moderate-severe MR, severe TR, mild-moderate AS, IVC dilated. CPX (5/21) with moderate HF limitation, RHC (4/21) with CI low at 2.12.  Medication titration has been severely limited by hypotension and orthostatic symptoms.  Not a transplant candidate due to smoking. She was seen by cardiac surgery [facility] and not thought to be good LVAD candidate due to small stature, small LV cavity, and redo  sternotomy.  I sent her for a 2nd opinion at [facility].  She had echo there in 11/21 with EF higher at 35% but CI low on RHC at 1.8, no LVAD planned based on higher EF.  Echo in 5/22 showed EF yet again increased to 45-50%.  NYHA III symptoms chronically.  She is not volume overloaded on exam or by device interrogation, weight down 5 lbs. - Continue torsemide 60 mg bid. Recent labs reviewed and stable, K 3.7, creatinine 0.70 - Continue vericiguat 10 mg daily.  - Continue metolazone 2.5 mg every Monday and Friday. - Continue spironolactone 25 mg daily.  - Continue digoxin 0.0625 mg daily. Dig level 0.7 on 06/18/22 - She has not tolerated BP- active meds well, avoid Coreg and Entresto for now.  She became hypotensive with losartan.  - She did not tolerate SGLT2 inhibitor due to recurrent GU yeast infections.  - Arrange for repeat echo.  2. CAD: s/p CABG 1696 complicated by early graft closure.  Repeat cath in 2007 showed only LIMA-LAD still open and she had DES to LCX/OM.  Cath in 2016 showed this stent was occluded.  She has not had targets for redo CABG and she does not have good interventional targets. No recent chest pain. - Continue ASA 81, Plavix 75 mg daily.  - Continue ranolazone 1000 bid.   - Continue Crestor 20 mg daily + Zetia 10 mg daily, and Vascepa. Good lipids 9/23. 3. Smoking: Still smoking 1/2 ppd.  She is trying to quit.  - I again recommended nicotine patches.  - Will give refill of her albuterol inhaler. Needs follow up with Dr. Halford Chessman. 4. PAD: s/p atherectomy/angioplasty right SFA in 7/21. Still with significant claudication. Peripheral arterial dopplers in 2/22 with 50-74% right SFA stenosis.  - Follows with Dr. Gwenlyn Found for PV.  5. Carotid stenosis: Moderate stenosis on prior imaging.  - Carotid dopplers 10/23 no change from prior.   6. Fe deficiency anemia: Has been FOBT negative.  Has had Feraheme.   - Heme/Onc recs oral iron suppl. Gifted gift card today to help with drug cost for  OTC iron. 7. Small cell lung cancer: Finished chemo and radiation. Recent CT with findings concerning for progression of disease. - She is followed by Dr. Julien Nordmann.  Schedule echo, and follow up in 4 months with Dr. Aundra Dubin. Will refill her PRN Nitro spray.   Diane [name] [facility] FNP-BC 08/16/2022

## 2022-08-20 ENCOUNTER — Ambulatory Visit (HOSPITAL_COMMUNITY)
Admission: RE | Admit: 2022-08-20 | Discharge: 2022-08-20 | Disposition: A | Discharge status: Home / Self Care | Payer: Medicare Other | Source: Ambulatory Visit | Attending: Internal Medicine | Admitting: Internal Medicine

## 2022-08-20 ENCOUNTER — Other Ambulatory Visit (HOSPITAL_COMMUNITY): Payer: Self-pay | Admitting: Cardiovascular Disease

## 2022-08-20 ENCOUNTER — Encounter (HOSPITAL_COMMUNITY): Payer: Medicare Other

## 2022-08-20 DIAGNOSIS — C3490 Malignant neoplasm of unspecified part of unspecified bronchus or lung: Secondary | ICD-10-CM | POA: Diagnosis not present

## 2022-08-20 DIAGNOSIS — I6523 Occlusion and stenosis of bilateral carotid arteries: Secondary | ICD-10-CM

## 2022-08-20 DIAGNOSIS — R911 Solitary pulmonary nodule: Secondary | ICD-10-CM | POA: Diagnosis not present

## 2022-08-20 LAB — GLUCOSE, CAPILLARY: Glucose-Capillary: 223 mg/dL — ABNORMAL HIGH (ref 70–99)

## 2022-08-20 MED ORDER — FLUDEOXYGLUCOSE F - 18 (FDG) INJECTION
5.3000 | Freq: Once | INTRAVENOUS | Status: AC
  Administered 2022-08-20: 5.52 via INTRAVENOUS

## 2022-08-21 ENCOUNTER — Other Ambulatory Visit (HOSPITAL_COMMUNITY): Payer: Self-pay | Admitting: Family Medicine

## 2022-08-22 ENCOUNTER — Other Ambulatory Visit: Payer: Self-pay | Admitting: Medical Oncology

## 2022-08-22 ENCOUNTER — Inpatient Hospital Stay: Payer: Medicare Other | Attending: Internal Medicine

## 2022-08-22 ENCOUNTER — Other Ambulatory Visit: Payer: Self-pay

## 2022-08-22 ENCOUNTER — Inpatient Hospital Stay (HOSPITAL_BASED_OUTPATIENT_CLINIC_OR_DEPARTMENT_OTHER): Payer: Medicare Other | Admitting: Internal Medicine

## 2022-08-22 ENCOUNTER — Other Ambulatory Visit: Payer: Self-pay | Admitting: Physician Assistant

## 2022-08-22 VITALS — BP 113/58 | HR 99 | Temp 97.6°F | Resp 16 | Wt 116.5 lb

## 2022-08-22 DIAGNOSIS — D509 Iron deficiency anemia, unspecified: Secondary | ICD-10-CM

## 2022-08-22 DIAGNOSIS — C779 Secondary and unspecified malignant neoplasm of lymph node, unspecified: Secondary | ICD-10-CM | POA: Insufficient documentation

## 2022-08-22 DIAGNOSIS — C342 Malignant neoplasm of middle lobe, bronchus or lung: Secondary | ICD-10-CM | POA: Insufficient documentation

## 2022-08-22 DIAGNOSIS — C3490 Malignant neoplasm of unspecified part of unspecified bronchus or lung: Secondary | ICD-10-CM

## 2022-08-22 DIAGNOSIS — Z95828 Presence of other vascular implants and grafts: Secondary | ICD-10-CM

## 2022-08-22 LAB — CBC WITH DIFFERENTIAL (CANCER CENTER ONLY)
Abs Immature Granulocytes: 0.01 10*3/uL (ref 0.00–0.07)
Basophils Absolute: 0 10*3/uL (ref 0.0–0.1)
Basophils Relative: 0 %
Eosinophils Absolute: 0.1 10*3/uL (ref 0.0–0.5)
Eosinophils Relative: 1 %
HCT: 32.5 % — ABNORMAL LOW (ref 36.0–46.0)
Hemoglobin: 9.7 g/dL — ABNORMAL LOW (ref 12.0–15.0)
Immature Granulocytes: 0 %
Lymphocytes Relative: 12 %
Lymphs Abs: 1 10*3/uL (ref 0.7–4.0)
MCH: 22.1 pg — ABNORMAL LOW (ref 26.0–34.0)
MCHC: 29.8 g/dL — ABNORMAL LOW (ref 30.0–36.0)
MCV: 74 fL — ABNORMAL LOW (ref 80.0–100.0)
Monocytes Absolute: 0.6 10*3/uL (ref 0.1–1.0)
Monocytes Relative: 8 %
Neutro Abs: 6.4 10*3/uL (ref 1.7–7.7)
Neutrophils Relative %: 79 %
Platelet Count: 245 10*3/uL (ref 150–400)
RBC: 4.39 MIL/uL (ref 3.87–5.11)
RDW: 18.1 % — ABNORMAL HIGH (ref 11.5–15.5)
WBC Count: 8.2 10*3/uL (ref 4.0–10.5)
nRBC: 0 % (ref 0.0–0.2)

## 2022-08-22 LAB — CMP (CANCER CENTER ONLY)
ALT: 13 U/L (ref 0–44)
AST: 20 U/L (ref 15–41)
Albumin: 4.1 g/dL (ref 3.5–5.0)
Alkaline Phosphatase: 69 U/L (ref 38–126)
Anion gap: 12 (ref 5–15)
BUN: 20 mg/dL (ref 8–23)
CO2: 28 mmol/L (ref 22–32)
Calcium: 9.5 mg/dL (ref 8.9–10.3)
Chloride: 89 mmol/L — ABNORMAL LOW (ref 98–111)
Creatinine: 1.01 mg/dL — ABNORMAL HIGH (ref 0.44–1.00)
GFR, Estimated: >60 mL/min (ref >60)
Glucose, Bld: 184 mg/dL — ABNORMAL HIGH (ref 70–99)
Potassium: 3.1 mmol/L — ABNORMAL LOW (ref 3.5–5.1)
Sodium: 129 mmol/L — ABNORMAL LOW (ref 135–145)
Total Bilirubin: 0.5 mg/dL (ref 0.3–1.2)
Total Protein: 7.6 g/dL (ref 6.5–8.1)

## 2022-08-22 MED ORDER — HEPARIN SOD (PORK) LOCK FLUSH 100 UNIT/ML IV SOLN
500.0000 [IU] | Freq: Once | INTRAVENOUS | Status: AC
  Administered 2022-08-22: 500 [IU]

## 2022-08-22 MED ORDER — SODIUM CHLORIDE 0.9% FLUSH
10.0000 mL | Freq: Once | INTRAVENOUS | Status: AC
  Administered 2022-08-22: 10 mL

## 2022-08-22 NOTE — Progress Notes (Signed)
[facility] [phone]   [phone]  OFFICE PROGRESS NOTE  Tower, [name], MD [place] [address]  DIAGNOSIS:  1) Limited stage (T3, N2, M0) small cell lung cancer presented with right hilar and mediastinal lymphadenopathy. There was no hypermetabolism in the left adrenal gland lesion. Diagnosed in December 2022. 2)  Iron Deficiency Anemia   PRIOR THERAPY:  1) Systemic chemotherapy with systemic chemotherapy with carboplatin for an AUC of 5, etoposide 100 mg per metered squared, and Imfinzi 1500 mg IV every 3 weeks with Cosela for myeloprotection.  First dose on 10/16/2021.  Discontinued after 1 cycle as the patient staging PET scan showed limited stage disease. 2) systemic chemotherapy with carboplatin for an AUC of 5 on day 1 and etoposide 100 mg/m2 on days 1, 2, and 3 IV every 3 weeks.  First dose on 11/06/2021.  This will be concurrent with radiotherapy under the care of Dr. Sondra Come.  Status post 3 cycles. 3) Venofer infusions as needed. Last dose on 02/07/21    CURRENT THERAPY: Observation   INTERVAL HISTORY: Diane [name] 66 y.o. female returns to the clinic today for follow-up visit.  The patient is feeling fine today with no concerning complaints except for mild fatigue.  She continues to smoke at regular basis and I strongly encouraged her to quit smoking. She has no current chest pain, shortness of breath, cough or hemoptysis.  She has no nausea, vomiting, diarrhea or constipation.  She has no headache or visual changes.  She was found on previous CT scan of the chest to have new and enlarging pulmonary nodules in the right upper lobe and superior segment of the right upper lobe associated with septal thickening concerning for metastatic disease and probably lymphangitic spread.  I ordered a PET scan which was performed yesterday that showed no hypermetabolic activity in the individual pulmonary nodules but there was  perilymphatic nodularity in the lungs right greater than left again suggestive of lymphangitic carcinomatosis but not confirmed.  The patient is here today for evaluation and recommendation regarding her condition.  MEDICAL HISTORY: Past Medical History:  Diagnosis Date   Anemia    Arthritis    Carotid stenosis    a. s/p right carotid stent 10/2017.   Chronic systolic CHF (congestive heart failure) (HCC)    Colon polyps    COPD (chronic obstructive pulmonary disease) (HCC)    Coronary artery disease    post CABG in 3/07 , coronary stents    Depression    after major stroke years ago, no longer treated   Diabetes mellitus    type 2   Dyslipidemia    Fatty liver    Fibromyalgia    GERD (gastroesophageal reflux disease)    Headache    hx of    History of kidney stones    History of radiation therapy    right lung 11/15/2021-12/26/2021  Dr Gery Pray   Hyperlipidemia    Hypertension    Lung cancer [facility])    Myocardial infarction ([place])    Pneumonia    hx of    Seizures ([place])    last seizure- 03/2013    Shortness of breath dyspnea    with exertion or when fluid builds up    Sleep apnea    used to wear a cpap- not used in 3 years    Status post dilation of esophageal narrowing    Stroke [facility]) 1993  problems with balance    Systolic murmur    known mild AS and MR   Thyroid goiter    Tobacco abuse     ALLERGIES:  is allergic to bee venom, benadryl [diphenhydramine], entresto [sacubitril-valsartan], farxiga [dapagliflozin], tetracycline, canagliflozin, morphine sulfate er, sitagliptin, and ace inhibitors.  MEDICATIONS:  Current Outpatient Medications  Medication Sig Dispense Refill   acetaminophen (TYLENOL) 500 MG tablet Take 1,000 mg by mouth at bedtime.     albuterol (PROVENTIL) (2.5 MG/3ML) 0.083% nebulizer solution Take 3 mLs (2.5 mg total) by nebulization every 6 (six) hours as needed for wheezing or shortness of breath. 1180 mL 3   albuterol (VENTOLIN HFA) 108 (90  Base) MCG/ACT inhaler INHALE 2 PUFFS EVERY 6 HOURS AS NEEDED FOR WHEEZING/SHORTNESS OF BREATH 18 each 0   aspirin EC 81 MG tablet Take 81 mg by mouth every evening.  30 tablet 6   BD PEN NEEDLE NANO 2ND GEN 32G X 4 MM MISC Inject into the skin 3 (three) times daily.     clopidogrel (PLAVIX) 75 MG tablet TAKE 1 TABLET DAILY 90 tablet 3   dexlansoprazole (DEXILANT) 60 MG capsule TAKE 1 CAPSULE DAILY 90 capsule 1   digoxin (LANOXIN) 0.125 MG tablet TAKE 1/2 TABLET DAILY 45 tablet 3   diphenhydrAMINE (BENADRYL) 25 mg capsule Take 25 mg by mouth 2 (two) times daily as needed for itching or allergies.     ezetimibe (ZETIA) 10 MG tablet Take 1 tablet (10 mg total) by mouth daily. PLEASE CALL OFFICE FOR FOLLOW UP APPOINTMENT 90 tablet 3   fluticasone (FLONASE) 50 MCG/ACT nasal spray Place 2 sprays into both nostrils daily. 48 g 3   gabapentin (NEURONTIN) 300 MG capsule TAKE 2 CAPSULES DAILY AT   BEDTIME 180 capsule 1   glucose blood (ONETOUCH ULTRA) test strip 3 (three) times daily.     guaiFENesin (MUCINEX) 600 MG 12 hr tablet Take 2 tablets (1,200 mg total) by mouth 2 (two) times daily. 360 tablet 3   HYDROcodone-acetaminophen (NORCO) 5-325 MG tablet Take 1 tablet by mouth every 6 (six) hours as needed for moderate pain. (Patient not taking: Reported on 08/16/2022) 30 tablet 0   insulin aspart protamine- aspart (NOVOLOG MIX 70/30) (70-30) 100 UNIT/ML injection Inject 5-65 Units into the skin See admin instructions. 65 units in the Am with breakfast and 5 units with lunch and 55 units with dinner     levETIRAcetam (KEPPRA) 1000 MG tablet Take 1 tablet (1,000 mg total) by mouth 2 (two) times daily. 180 tablet 1   lidocaine-prilocaine (EMLA) cream Apply to the Port-A-Cath site 30-60 minutes before chemotherapy 30 g 0   metFORMIN (GLUCOPHAGE) 500 MG tablet Take 500 mg by mouth 2 (two) times daily with a meal.     metolazone (ZAROXOLYN) 2.5 MG tablet TAKE 1 TABLET TWICE WEEKLY,EVERY MONDAY AND FRIDAY     (CHANGE IN DOSAGE). 26 tablet 3   nitroGLYCERIN (NITROLINGUAL) 0.4 MG/SPRAY spray Place 1 spray under the tongue every 5 (five) minutes x 3 doses as needed for chest pain. 12 g 3   nystatin (MYCOSTATIN/NYSTOP) powder APPLY 1 APPLICATION        TOPICALLY 4 TIMES A DAY AS NEEDED FOR YEAST INFECTIONS 60 g 5   ONE TOUCH ULTRA TEST test strip USE AS DIRECTED FOR TESTING BLOOD GLUCOSE 3 TIMES DAILY  1   polyethylene glycol (MIRALAX / GLYCOLAX) 17 g packet Take 17 g by mouth daily.     potassium chloride (KLOR-CON M) 10  MEQ tablet Take 10 mEq by mouth 2 (two) times daily.     promethazine (PHENERGAN) 12.5 MG tablet Take 1 tablet (12.5 mg total) by mouth every 6 (six) hours as needed for nausea or vomiting. 30 tablet 2   ranolazine (RANEXA) 1000 MG SR tablet TAKE 1 TABLET TWICE A DAY 180 tablet 3   rosuvastatin (CRESTOR) 20 MG tablet Take 1 tablet (20 mg total) by mouth daily. PLEASE CALL OFFICE FOR FOLLOW UP APPOINTMENT 90 tablet 3   sodium chloride (OCEAN) 0.65 % SOLN nasal spray Place 1 spray into both nostrils as needed for congestion.     spironolactone (ALDACTONE) 25 MG tablet Take 1 tablet (25 mg total) by mouth at bedtime. PLEASE CALL OFFICE FOR FOLLOW UP APPOINTMENT 90 tablet 3   torsemide (DEMADEX) 20 MG tablet Take 3 tablets (60 mg total) by mouth 2 (two) times daily. 220 tablet 6   triamcinolone cream (KENALOG) 0.5 % Apply 1 application topically 3 (three) times daily as needed (rash). To affected areas     VASCEPA 1 g capsule TAKE 2 CAPSULES (2GRAMS    TOTAL) TWO TIMES A DAY 120 capsule 11   VERQUVO 10 MG TABS TAKE 1 TABLET ONCE DAILY 90 tablet 3   No current facility-administered medications for this visit.    SURGICAL HISTORY:  Past Surgical History:  Procedure Laterality Date   ABDOMINAL AORTOGRAM W/LOWER EXTREMITY N/A 04/18/2020   Procedure: ABDOMINAL AORTOGRAM W/LOWER EXTREMITY;  Surgeon: Lorretta Harp, MD;  Location: [place] [facility];  Service: Cardiovascular;  Laterality: N/A;    APPENDECTOMY     BIOPSY  02/03/2019   Procedure: BIOPSY;  Surgeon: Ladene Artist, MD;  Location: [facility];  Service: Endoscopy;;   BREAST BIOPSY Left 2016   BRONCHIAL BIOPSY  09/18/2021   Procedure: BRONCHIAL BIOPSIES;  Surgeon: Collene Gobble, MD;  Location: [facility] ENDOSCOPY;  Service: Pulmonary;;   BRONCHIAL BRUSHINGS  09/18/2021   Procedure: BRONCHIAL BRUSHINGS;  Surgeon: Collene Gobble, MD;  Location: [facility] ENDOSCOPY;  Service: Pulmonary;;   BRONCHIAL NEEDLE ASPIRATION BIOPSY  09/18/2021   Procedure: BRONCHIAL NEEDLE ASPIRATION BIOPSIES;  Surgeon: Collene Gobble, MD;  Location: [facility] ENDOSCOPY;  Service: Pulmonary;;   CARDIAC CATHETERIZATION  11/29/05   EF of 55%   CARDIAC CATHETERIZATION  08/06/06   EF of 45-50%   CARDIAC CATHETERIZATION N/A 05/06/2015   Procedure: Right/Left Heart Cath and Coronary/Graft Angiography;  Surgeon: Peter M Martinique, MD;  Location: [place] [facility];  Service: Cardiovascular;  Laterality: N/A;   CAROTID PTA/STENT INTERVENTION N/A 10/10/2017   Procedure: CAROTID PTA/STENT INTERVENTION - Right;  Surgeon: Serafina Mitchell, MD;  Location: [place] [facility];  Service: Cardiovascular;  Laterality: N/A;   CERVICAL FUSION  1990   CHOLECYSTECTOMY     COLON RESECTION     mass removed and 4 in of colon   COLONOSCOPY WITH PROPOFOL N/A 07/16/2017   Procedure: COLONOSCOPY WITH PROPOFOL;  Surgeon: Ladene Artist, MD;  Location: [facility];  Service: Endoscopy;  Laterality: N/A;   CORONARY ARTERY BYPASS GRAFT  12/04/2005   x5 -- left internal mammary artery to the LAD, left radial artery to the ramus intermedius, saphenous vein graft to the obtuse marginal 1, sequential saphenous vein grat to the acute marginal and posterior descending, endoscopic vein harvesting from the left thigh with open vein harvest from right leg   CORONARY STENT PLACEMENT  08/11/06   PCI of her ciurcumflex/OM vessel   ESOPHAGOGASTRODUODENOSCOPY (EGD) WITH PROPOFOL  N/A 02/03/2019   Procedure:  ESOPHAGOGASTRODUODENOSCOPY (EGD) WITH PROPOFOL;  Surgeon: Ladene Artist, MD;  Location: [facility];  Service: Endoscopy;  Laterality: N/A;   FINE NEEDLE ASPIRATION  09/18/2021   Procedure: FINE NEEDLE ASPIRATION (FNA) LINEAR;  Surgeon: Collene Gobble, MD;  Location: [facility] ENDOSCOPY;  Service: Pulmonary;;   ICD IMPLANT N/A 07/15/2020   Procedure: ICD IMPLANT;  Surgeon: Vickie Epley, MD;  Location: [place] [facility];  Service: Cardiovascular;  Laterality: N/A;   IR IMAGING GUIDED PORT INSERTION  10/20/2021   LAPAROTOMY Bilateral 05/19/2015   Procedure: EXPLORATORY LAPAROTOMY WITH BILATERAL SALPINGO OOPHORECTOMY /OMENTECTOMY/SEGMENTAL SIGMOID COLECTOMY ;  Surgeon: Everitt Amber, MD;  Location: [facility];  Service: Gynecology;  Laterality: Bilateral;   PERIPHERAL VASCULAR BALLOON ANGIOPLASTY  04/18/2020   Procedure: PERIPHERAL VASCULAR BALLOON ANGIOPLASTY;  Surgeon: Lorretta Harp, MD;  Location: [place] [facility];  Service: Cardiovascular;;  rt SFA  atherectomy and DCB   PERIPHERAL VASCULAR CATHETERIZATION N/A 11/15/2015   Procedure: Carotid PTA/Stent Intervention;  Surgeon: Lorretta Harp, MD;  Location: [place] [facility];  Service: Cardiovascular;  Laterality: N/A;   PERIPHERAL VASCULAR CATHETERIZATION  11/15/2015   Procedure: Carotid Angiography;  Surgeon: Lorretta Harp, MD;  Location: [place] [facility];  Service: Cardiovascular;;   RIGHT HEART CATH N/A 01/27/2020   Procedure: RIGHT HEART CATH;  Surgeon: Larey Dresser, MD;  Location: [place] [facility];  Service: Cardiovascular;  Laterality: N/A;   VIDEO BRONCHOSCOPY WITH ENDOBRONCHIAL ULTRASOUND N/A 09/18/2021   Procedure: VIDEO BRONCHOSCOPY WITH ENDOBRONCHIAL ULTRASOUND;  Surgeon: Collene Gobble, MD;  Location: [place] ENDOSCOPY;  Service: Pulmonary;  Laterality: N/A;   VIDEO BRONCHOSCOPY WITH RADIAL ENDOBRONCHIAL ULTRASOUND  09/18/2021   Procedure: RADIAL ENDOBRONCHIAL ULTRASOUND;  Surgeon: Collene Gobble, MD;  Location: [facility] ENDOSCOPY;   Service: Pulmonary;;    REVIEW OF SYSTEMS:  Constitutional: positive for fatigue Eyes: negative Ears, nose, mouth, throat, and face: negative Respiratory: negative Cardiovascular: negative Gastrointestinal: negative Genitourinary:negative Integument/breast: negative Hematologic/lymphatic: negative Musculoskeletal:negative Neurological: negative Behavioral/Psych: negative Endocrine: negative Allergic/Immunologic: negative   PHYSICAL EXAMINATION: General appearance: alert, cooperative, appears stated age, fatigued, and no distress Head: Normocephalic, without obvious abnormality, atraumatic Neck: no adenopathy, no JVD, supple, symmetrical, trachea midline, and thyroid not enlarged, symmetric, no tenderness/mass/nodules Lymph nodes: Cervical, supraclavicular, and axillary nodes normal. Resp: clear to auscultation bilaterally Back: symmetric, no curvature. ROM normal. No CVA tenderness. Cardio: regular rate and rhythm, S1, S2 normal, no murmur, click, rub or gallop GI: soft, non-tender; bowel sounds normal; no masses,  no organomegaly Extremities: extremities normal, atraumatic, no cyanosis or edema Neurologic: Alert and oriented X 3, normal strength and tone. Normal symmetric reflexes. Normal coordination and gait  ECOG PERFORMANCE STATUS: 1 - Symptomatic but completely ambulatory  Blood pressure (!) 113/58, pulse 99, temperature 97.6 F (36.4 C), temperature source Oral, resp. rate 16, weight 116 lb 8 oz (52.8 kg), last menstrual period 09/02/2003, SpO2 99 %.  LABORATORY DATA: Lab Results  Component Value Date   WBC 8.2 08/22/2022   HGB 9.7 (L) 08/22/2022   HCT 32.5 (L) 08/22/2022   MCV 74.0 (L) 08/22/2022   PLT 245 08/22/2022      Chemistry      Component Value Date/Time   NA 131 (L) 07/27/2022 1441   NA 135 04/14/2020 1544   NA 135 (L) 02/15/2017 1457   K 3.7 07/27/2022 1441   K 4.8 02/15/2017 1457   CL 92 (L) 07/27/2022 1441   CO2 30 07/27/2022  1441   CO2 26  02/15/2017 1457   BUN 15 07/27/2022 1441   BUN 17 04/14/2020 1544   BUN 10.8 02/15/2017 1457   CREATININE 0.70 07/27/2022 1441   CREATININE 0.69 05/06/2017 1405   CREATININE 0.7 02/15/2017 1457      Component Value Date/Time   CALCIUM 9.4 07/27/2022 1441   CALCIUM 9.3 02/15/2017 1457   ALKPHOS 61 07/27/2022 1441   ALKPHOS 75 02/15/2017 1457   AST 10 (L) 07/27/2022 1441   AST 9 02/15/2017 1457   ALT 7 07/27/2022 1441   ALT 13 02/15/2017 1457   BILITOT 0.4 07/27/2022 1441   BILITOT 0.28 02/15/2017 1457       RADIOGRAPHIC STUDIES: NM PET Image Restage (PS) Skull Base to Thigh (F-18 FDG)  Result Date: 08/21/2022 CLINICAL DATA:  Subsequent treatment strategy for small cell lung cancer. EXAM: NUCLEAR MEDICINE PET SKULL BASE TO THIGH TECHNIQUE: 5.5 mCi F-18 FDG was injected intravenously. Full-ring PET imaging was performed from the skull base to thigh after the radiotracer. CT data was obtained and used for attenuation correction and anatomic localization. Fasting blood glucose: 223 mg/dl COMPARISON:  CT chest 07/28/2022, 04/23/2022, 01/18/2022 and PET 10/19/2021. FINDINGS: Mediastinal blood pool activity: SUV max 2.5 Liver activity: SUV max NA NECK: No abnormal hypermetabolism. Incidental CT findings: None. CHEST: No abnormal hypermetabolism. Incidental CT findings: Right IJ Port-A-Cath terminates at the SVC RA junction. Atherosclerotic calcification of the aorta and aortic valve. Heart is enlarged. No pericardial effusion. Upon review of the lung, image quality is degraded by respiratory motion and thick slice collimation. However, there is septal thickening and perilymphatic nodularity, right greater than left. Individual nodules are too small for PET resolution. ABDOMEN/PELVIS: No abnormal hypermetabolism. Incidental CT findings: Liver is unremarkable. Cholecystectomy. Slight nodular thickening of the adrenal glands, unchanged. No specific follow-up necessary. Stones in the kidneys. Spleen,  pancreas, stomach and bowel are grossly unremarkable. Small right paramidline supraumbilical ventral hernia has a very wide neck and contains a knuckle of colon. Atherosclerotic calcification of the aorta. SKELETON: No abnormal hypermetabolism. Incidental CT findings: A bone island in left sacrum. Degenerative changes in the spine. IMPRESSION: 1. Perilymphatic nodularity in the lungs, right greater than left, highly indicative lymphangitic carcinomatosis. Individual nodules are too small for PET resolution. 2. Bilateral renal stones. 3.  Aortic atherosclerosis (ICD10-I70.0). Electronically Signed   By: Lorin Picket M.D.   On: 08/21/2022 13:58   CT Chest W Contrast  Result Date: 07/31/2022 CLINICAL DATA:  A 66 year old female presents for evaluation of small cell lung cancer. * Tracking Code: BO * EXAM: CT CHEST WITH CONTRAST TECHNIQUE: Multidetector CT imaging of the chest was performed during intravenous contrast administration. RADIATION DOSE REDUCTION: This exam was performed according to the departmental dose-optimization program which includes automated exposure control, adjustment of the mA and/or kV according to patient size and/or use of iterative reconstruction technique. CONTRAST:  61mL OMNIPAQUE IOHEXOL 300 MG/ML  SOLN COMPARISON:  March 24, 2022. FINDINGS: Cardiovascular: Post median sternotomy for CABG. Signs of prior percutaneous coronary intervention as well. Calcified and noncalcified aortic atherosclerotic plaque with ulcerated plaque along the descending thoracic aorta showing no change. RIGHT-sided Port-A-Cath terminates at the caval to atrial junction. Heart size is stable without pericardial effusion or nodularity. Three-vessel coronary artery calcificationsw. Single lead pacer defibrillator in place. Mediastinum/Nodes: No thoracic inlet, axillary, mediastinal or hilar adenopathy. Esophagus grossly normal. Lungs/Pleura: Interstitial thickening and multifocal nodularity has developed in  the RIGHT upper lobe since previous imaging. Pre-existing nodules that  were present on the prior study are enlarged and there are more numerous nodules in the RIGHT upper lobe. (Image 61/5) 6 mm RIGHT upper lobe pulmonary nodule adjacent to bronchovascular structures is new compared to previous imaging. Multifocal nodularity with greater than 20 pulmonary nodules in the RIGHT upper lobe on the current exam also with nodularity affecting the superior segment of the RIGHT lower lobe. (Image 48/5) RIGHT upper lobe pulmonary nodule 5 mm previously 2-3 mm. Adjacent RIGHT lower lobe nodule (image 52/5) 7 mm in the superior segment of the RIGHT lower lobe. (Image 56/5) 6 mm RIGHT upper lobe pulmonary nodule, minimal nodularity noted in this area now with multiple nodules scattered throughout the RIGHT upper lobe and superior segment of the RIGHT lower lobe. (Image 72/5) 7 mm superior segment RIGHT lower lobe nodule. (Image 65/5) 9 x 5 mm RIGHT lower lobe pulmonary nodule. Signs of septal thickening and subpleural reticulation which have developed in the interval. New LEFT upper lobe pulmonary nodule (image 31/5) 4 mm. Airways are patent. Upper Abdomen: No acute findings in the upper abdomen. Stable adrenal thickening likely related to hyperplasia is unchanged Musculoskeletal: No acute bone finding. No destructive bone process. Spinal degenerative changes. IMPRESSION: 1. New and enlarging pulmonary nodules and RIGHT upper lobe and superior segment of the RIGHT upper lobe, associated with septal thickening findings are concerning for metastatic disease perhaps with lymphangitic carcinomatosis. PET imaging may be helpful for further evaluation. No recurrent adenopathy. Aortic Atherosclerosis (ICD10-I70.0). Electronically Signed   By: Zetta Bills M.D.   On: 07/31/2022 13:12   CUP PACEART REMOTE DEVICE CHECK  Result Date: 07/24/2022 Scheduled remote reviewed. Normal device function.  5 NSVT, short Next remote 91 days-  JJB   ASSESSMENT AND PLAN: This is a very pleasant 66 years old white female recently diagnosed with Limited stage (T3, N2, M0) small cell lung cancer presented with right hilar and mediastinal lymphadenopathy. There was no hypermetabolism in the left adrenal gland lesion. Diagnosed in December 2022. She initially started treatment with carboplatin, etoposide, Cosela and Imfinzi with the expectation that she had extensive stage disease status post 1 cycle. Staging work-up with PET scan showed no hypermetabolic activity and the suspicious left adrenal gland lesion. Her treatment was switched to chemotherapy with with carboplatin for AUC of 5 on day 1, etoposide 100 Mg/M2 on days 1, 2 and 3 every 3 weeks concurrent with radiation.  First dose today 11/06/2021.  Status post 3 cycles. She tolerated her treatment well with no concerning adverse effect except for fatigue as well as radiation-induced esophagitis with dysphagia and odynophagia. The patient declined prophylactic cranial irradiation. The patient is currently on observation and she is feeling fine with no concerning complaints except for mild fatigue. She had a recent CT scan of the chest followed by PET scan that showed suspicious lymphangitic carcinomatosis but no hypermetabolic activity in the individual lung nodules that were reported on the CT scan of the chest. I am still not 100% sure about the possibility of lymphangitic carcinomatosis and it will be hard to commit the patient to systemic chemotherapy based on the suspicious findings.  I will start the patient on 2 weeks treatment with doxycycline as well as short course of Medrol Dosepak for possible inflammatory process. I will see her back for follow-up visit in 6 weeks for evaluation with repeat CT scan of the chest for restaging of her disease. If the scan showed further increase in the lymphangitic spread or other concerning finding for  disease progression, then I will consider her for  systemic therapy. The patient is in agreement with the current plan. For the iron deficiency anemia, I strongly encouraged the patient to start taking over-the-counter oral iron tablet at regular basis. She was advised to call immediately if she has any other concerning symptoms in the interval. The patient voices understanding of current disease status and treatment options and is in agreement with the current care plan.  All questions were answered. The patient knows to call the clinic with any problems, questions or concerns. We can certainly see the patient much sooner if necessary. The total time spent in the appointment was 20 minutes.  Disclaimer: This note was dictated with voice recognition software. Similar sounding words can inadvertently be transcribed and may not be corrected upon review.

## 2022-08-23 ENCOUNTER — Other Ambulatory Visit: Payer: Self-pay | Admitting: Neurology

## 2022-08-23 DIAGNOSIS — G40009 Localization-related (focal) (partial) idiopathic epilepsy and epileptic syndromes with seizures of localized onset, not intractable, without status epilepticus: Secondary | ICD-10-CM

## 2022-08-24 ENCOUNTER — Telehealth: Payer: Self-pay | Admitting: Medical Oncology

## 2022-08-24 NOTE — Telephone Encounter (Signed)
Pt instructed oh potassium rich foods.

## 2022-08-24 NOTE — Telephone Encounter (Signed)
-----   Message from Curt Bears, MD sent at 08/22/2022  4:48 PM EST ----- Please encourage her to increase her potassium rich diet. ----- Message ----- From: Buel Ream, Lab In [place] Sent: 08/22/2022   1:59 PM EST To: Curt Bears, MD

## 2022-08-27 ENCOUNTER — Ambulatory Visit (HOSPITAL_COMMUNITY)
Admission: RE | Admit: 2022-08-27 | Discharge: 2022-08-27 | Disposition: A | Discharge status: Home / Self Care | Payer: Medicare Other | Source: Ambulatory Visit | Attending: Cardiology | Admitting: Cardiology

## 2022-08-27 DIAGNOSIS — Z87891 Personal history of nicotine dependence: Secondary | ICD-10-CM | POA: Insufficient documentation

## 2022-08-27 DIAGNOSIS — R0602 Shortness of breath: Secondary | ICD-10-CM | POA: Diagnosis not present

## 2022-08-27 DIAGNOSIS — R0600 Dyspnea, unspecified: Secondary | ICD-10-CM | POA: Diagnosis not present

## 2022-08-27 DIAGNOSIS — I080 Rheumatic disorders of both mitral and aortic valves: Secondary | ICD-10-CM | POA: Diagnosis not present

## 2022-08-27 DIAGNOSIS — I5022 Chronic systolic (congestive) heart failure: Secondary | ICD-10-CM | POA: Diagnosis not present

## 2022-08-27 DIAGNOSIS — Z85118 Personal history of other malignant neoplasm of bronchus and lung: Secondary | ICD-10-CM | POA: Insufficient documentation

## 2022-08-27 DIAGNOSIS — E785 Hyperlipidemia, unspecified: Secondary | ICD-10-CM | POA: Insufficient documentation

## 2022-08-27 DIAGNOSIS — G4730 Sleep apnea, unspecified: Secondary | ICD-10-CM | POA: Insufficient documentation

## 2022-08-27 DIAGNOSIS — I2510 Atherosclerotic heart disease of native coronary artery without angina pectoris: Secondary | ICD-10-CM | POA: Insufficient documentation

## 2022-08-27 DIAGNOSIS — E119 Type 2 diabetes mellitus without complications: Secondary | ICD-10-CM | POA: Insufficient documentation

## 2022-08-27 DIAGNOSIS — Z8673 Personal history of transient ischemic attack (TIA), and cerebral infarction without residual deficits: Secondary | ICD-10-CM | POA: Insufficient documentation

## 2022-08-27 DIAGNOSIS — I110 Hypertensive heart disease with heart failure: Secondary | ICD-10-CM | POA: Insufficient documentation

## 2022-08-27 DIAGNOSIS — J449 Chronic obstructive pulmonary disease, unspecified: Secondary | ICD-10-CM | POA: Insufficient documentation

## 2022-08-27 DIAGNOSIS — I252 Old myocardial infarction: Secondary | ICD-10-CM | POA: Diagnosis not present

## 2022-08-27 LAB — ECHOCARDIOGRAM COMPLETE
AR max vel: 2.23 cm2
AV Area VTI: 2.27 cm2
AV Area mean vel: 2.26 cm2
AV Mean grad: 10 mmHg
AV Peak grad: 15.8 mmHg
Ao pk vel: 1.99 m/s
Area-P 1/2: 4.93 cm2
Calc EF: 24.5 %
MV M vel: 4.7 m/s
MV Peak grad: 88.4 mmHg
S' Lateral: 4.3 cm
Single Plane A2C EF: 29.6 %
Single Plane A4C EF: 15.4 %

## 2022-08-27 NOTE — Progress Notes (Signed)
  Echocardiogram 2D Echocardiogram has been performed.  Diane [name] 08/27/2022, 3:25 PM

## 2022-08-30 DIAGNOSIS — I110 Hypertensive heart disease with heart failure: Secondary | ICD-10-CM | POA: Diagnosis not present

## 2022-08-30 DIAGNOSIS — I5042 Chronic combined systolic (congestive) and diastolic (congestive) heart failure: Secondary | ICD-10-CM | POA: Diagnosis not present

## 2022-08-30 DIAGNOSIS — J449 Chronic obstructive pulmonary disease, unspecified: Secondary | ICD-10-CM | POA: Diagnosis not present

## 2022-09-12 ENCOUNTER — Other Ambulatory Visit: Payer: Medicare Other

## 2022-09-26 ENCOUNTER — Encounter: Payer: Medicare Other | Admitting: Family Medicine

## 2022-09-29 DIAGNOSIS — J449 Chronic obstructive pulmonary disease, unspecified: Secondary | ICD-10-CM | POA: Diagnosis not present

## 2022-09-29 DIAGNOSIS — I5042 Chronic combined systolic (congestive) and diastolic (congestive) heart failure: Secondary | ICD-10-CM | POA: Diagnosis not present

## 2022-09-29 DIAGNOSIS — I110 Hypertensive heart disease with heart failure: Secondary | ICD-10-CM | POA: Diagnosis not present

## 2022-09-29 DIAGNOSIS — E119 Type 2 diabetes mellitus without complications: Secondary | ICD-10-CM | POA: Diagnosis not present

## 2022-09-30 ENCOUNTER — Other Ambulatory Visit (HOSPITAL_COMMUNITY): Payer: Self-pay | Admitting: Family Medicine

## 2022-10-03 ENCOUNTER — Ambulatory Visit (INDEPENDENT_AMBULATORY_CARE_PROVIDER_SITE_OTHER): Payer: Medicare Other | Admitting: Family Medicine

## 2022-10-03 ENCOUNTER — Encounter: Payer: Self-pay | Admitting: Physician Assistant

## 2022-10-03 ENCOUNTER — Encounter: Payer: Self-pay | Admitting: Family Medicine

## 2022-10-03 VITALS — BP 102/58 | HR 96 | Temp 97.4°F | Ht <= 58 in | Wt 117.1 lb

## 2022-10-03 DIAGNOSIS — Z1231 Encounter for screening mammogram for malignant neoplasm of breast: Secondary | ICD-10-CM | POA: Diagnosis not present

## 2022-10-03 DIAGNOSIS — R569 Unspecified convulsions: Secondary | ICD-10-CM | POA: Diagnosis not present

## 2022-10-03 DIAGNOSIS — E785 Hyperlipidemia, unspecified: Secondary | ICD-10-CM

## 2022-10-03 DIAGNOSIS — E538 Deficiency of other specified B group vitamins: Secondary | ICD-10-CM

## 2022-10-03 DIAGNOSIS — Z23 Encounter for immunization: Secondary | ICD-10-CM | POA: Diagnosis not present

## 2022-10-03 DIAGNOSIS — K219 Gastro-esophageal reflux disease without esophagitis: Secondary | ICD-10-CM

## 2022-10-03 DIAGNOSIS — E049 Nontoxic goiter, unspecified: Secondary | ICD-10-CM | POA: Diagnosis not present

## 2022-10-03 DIAGNOSIS — M8589 Other specified disorders of bone density and structure, multiple sites: Secondary | ICD-10-CM | POA: Diagnosis not present

## 2022-10-03 DIAGNOSIS — E1142 Type 2 diabetes mellitus with diabetic polyneuropathy: Secondary | ICD-10-CM

## 2022-10-03 DIAGNOSIS — E1169 Type 2 diabetes mellitus with other specified complication: Secondary | ICD-10-CM | POA: Diagnosis not present

## 2022-10-03 DIAGNOSIS — I10 Essential (primary) hypertension: Secondary | ICD-10-CM | POA: Diagnosis not present

## 2022-10-03 DIAGNOSIS — F17200 Nicotine dependence, unspecified, uncomplicated: Secondary | ICD-10-CM | POA: Diagnosis not present

## 2022-10-03 DIAGNOSIS — Z794 Long term (current) use of insulin: Secondary | ICD-10-CM

## 2022-10-03 MED ORDER — VITAMIN D3 50 MCG (2000 UT) PO CAPS: 2000.0000 [IU] | ORAL_CAPSULE | Freq: Every day | ORAL | 3 refills | Status: DC

## 2022-10-03 NOTE — Assessment & Plan Note (Signed)
Per pt A1c is in 7 range Doing better Under endo care She want to schedule her own eye exam

## 2022-10-03 NOTE — Assessment & Plan Note (Signed)
Dexa 08/2021  Several falls and 2 fractures   (now balance is better and no longer falling) Cannot afford vit D-will see if pharmacy will fill   Disc fall prev Would not recommend bisphosphonate in light of her swallowing issues lately

## 2022-10-03 NOTE — Assessment & Plan Note (Signed)
Well covered in the setting of ischemic cardiomyopathy bp in fair control at this time  BP Readings from Last 1 Encounters:  10/03/22 (!) 102/58   No changes needed (not dizzy with current bp) Under care of cardiology Most recent labs reviewed  Disc lifstyle change with low sodium diet and exercise  Plan to continue medications for CHF with Dr Marigene Ehlers Spironolactone 25 mg daily  demadex 20 mg daily Digoxin 0.0625 mg daily  ranexa 1000 mg xr bid

## 2022-10-03 NOTE — Assessment & Plan Note (Signed)
Disc goals for lipids and reasons to control them Rev last labs with pt Rev low sat fat diet in detail  Some improvement in LDL, HDL and trig  Plan to continue  crestor 20 mg daily  zetia 10 mg daily  vascepa 2 g bid  Under care of cardiology Lab ordered today

## 2022-10-03 NOTE — Assessment & Plan Note (Signed)
Sees endocrinology  Tsh ordered

## 2022-10-03 NOTE — Assessment & Plan Note (Signed)
Oral supplementation  Level ordered On ppi- cannot stop this

## 2022-10-03 NOTE — Assessment & Plan Note (Signed)
Disc in detail risks of smoking and possible outcomes including copd, vascular/ heart disease, cancer , respiratory and sinus infections  Pt voices understanding She is cutting down but not ready to quit   In setting of lung cancer, vasc dz and copd

## 2022-10-03 NOTE — Assessment & Plan Note (Signed)
Keppra Seizure free  Under neuro care

## 2022-10-03 NOTE — Assessment & Plan Note (Signed)
Continues dexilant  Worsened by rad tx for lung cancer

## 2022-10-03 NOTE — Assessment & Plan Note (Signed)
Ordered mam at breast center She will call to schedule Nl exam

## 2022-10-03 NOTE — Patient Instructions (Addendum)
Check in with your pharmacy about RSV vaccine  Unsure what the price will be  I want you to get it if you can   I recommend the newest covid booster also   I will look into getting you some help scheduling those   Get your pulmonary visit scheduled   I will send in some vitamin D to see if insurance will cover it  You really need this 2000 iu daily   Schedule your diabetic eye exam when you can   Call and schedule your mammogram at the breast center I put the order in   Labs today

## 2022-10-03 NOTE — Progress Notes (Signed)
Subjective:    Patient ID: Diane [name], female    DOB: 08/16/1956, 67 y.o.   MRN: [id number]  HPI Pt presents for annual f/u of chronic health problems   Wt Readings from Last 3 Encounters:  10/03/22 117 lb 2 oz (53.1 kg)  08/22/22 116 lb 8 oz (52.8 kg)  08/16/22 114 lb 9.6 oz (52 kg)   25.35 kg/m  Immunization History  Administered Date(s) Administered   Fluad Quad(high Dose 65+) 06/23/2019, 06/07/2020, 06/26/2021   Influenza Whole 06/01/2012   Influenza,inj,Quad PF,6+ Mos 06/16/2013, 06/16/2014, 06/21/2015, 08/13/2016, 10/11/2017, 07/15/2018   Moderna Sars-Covid-2 Vaccination 12/31/2019, 01/30/2020   Pneumococcal Conjugate-13 05/11/2016   Pneumococcal Polysaccharide-23 11/18/1998, 11/04/2008, 06/16/2014, 06/23/2019   Td 10/01/1996, 11/15/2009   Health Maintenance Due  Topic Date Due   Diabetic kidney evaluation - Urine ACR  06/09/2014   HEMOGLOBIN A1C  08/26/2020   MAMMOGRAM  10/25/2021   INFLUENZA VACCINE  05/01/2022   FOOT EXAM  06/26/2022   Medicare Annual Wellness (AWV)  06/26/2022   OPHTHALMOLOGY EXAM  09/04/2022   Flu shot-today  Has pna vaccine in 2020   RSV- plans to get that vaccine if it is covered   Cannot go on line to schedule vaccines at pharmacy   Mammogram 10/2020   - she wants to schedule her own  Self breast exam- no lumps   Sees gyn   Colonoscopy 2018   Dexa  08/2021  osteopenia  Falls- last year she had some weakness (before her chemo) - fell 3 times  Is stronger now, no longer falling  Fractures, ankle, rib from those falls  Supplements : cannot afford vit D  Exercise : is doing some walking Has a walker    HTN   (in setting of cad and CHF and carotid dz) bp is stable today  No cp or palpitations or headaches or edema  No side effects to medicines  BP Readings from Last 3 Encounters:  10/03/22 (!) 102/58  08/22/22 (!) 113/58  08/16/22 114/68     Tolerates this bp-not dizzy at all  Last meds Spironolactone 25 mg daily   demadex 20 mg daily Digoxin 0.0625 mg daily   ranexa 1000 mg xr bid  Smoking status : less than 1 ppd  Longer between cigarettes  Is due for a yearly pulmonary visit     Copd  Small cell lung carcinoma - was treated with chemo and radiation  There radiation caused esoph stricture (had that dilated)  Has another scan Friday and onc f/u Monday  Has to see if she needs more chemo   GERD Dexilant   B12 def Lab Results  Component Value Date   VITAMINB12 246 06/19/2021    DM2 Sees Dr Chalmers Cater  Using a CGM  This is helpful  A1c was in the 7 range Goes back in April    Eye exam :  just due for now Some cataracts   Hyperlipidemia Lab Results  Component Value Date   CHOL 104 06/18/2022   HDL 33 (L) 06/18/2022   LDLCALC 34 06/18/2022   LDLDIRECT 76.0 04/11/2017   TRIG 186 (H) 06/18/2022   CHOLHDL 3.2 06/18/2022   crestor 20 mg daily  zetia 10 mg daily  vascepa 2 g bid  Under care of cardiology  H/o goiter Lab Results  Component Value Date   TSH 2.23 06/19/2021   Sees hematology for iron def Lab Results  Component Value Date   WBC 8.2 08/22/2022   HGB  9.7 (L) 08/22/2022   HCT 32.5 (L) 08/22/2022   MCV 74.0 (L) 08/22/2022   PLT 245 08/22/2022   Lab Results  Component Value Date   IRON 118 10/23/2021   TIBC 627 (H) 10/23/2021   FERRITIN 62 10/23/2021   Patient Active Problem List   Diagnosis Date Noted   Insulin dependent type 2 diabetes mellitus ([place]) 02/02/2022   Port-A-Cath in place 01/23/2022   Encounter for antineoplastic chemotherapy 11/06/2021   Primary small cell carcinoma of right lung ([place]) 11/06/2021   Malignant neoplasm of unspecified part of unspecified bronchus or lung ([place]) 10/04/2021   Pulmonary nodules/lesions, multiple 09/18/2021   Hilar adenopathy 09/18/2021   S/P bronchoscopy with biopsy    Financial difficulty 06/26/2021   Papules 05/01/2021   ASCUS with positive high risk HPV cervical 06/14/2020   Chronic pain of right lower  extremity 05/16/2020   Right knee pain 05/16/2020   Right groin pain 05/16/2020   Atherosclerotic vascular disease 03/03/2020   Healthcare maintenance 12/25/2019   Shortness of breath 12/25/2019   Odynophagia 12/09/2019   Iron deficiency anemia 11/05/2019   Coronary artery disease of bypass graft of native heart with stable angina pectoris ([place]) 11/02/2019   Visit for routine gyn exam 04/23/2019   Generalized weakness 03/24/2019   Occult blood in stools    Positive colorectal cancer screening using DNA-based stool test 12/22/2018   Anemia 12/22/2018   Fatigue 12/05/2018   Estrogen deficiency 04/21/2018   Screening mammogram, encounter for 04/21/2018   Osteopenia 04/21/2018   History of HPV infection 04/21/2018   Dizzy spells 03/03/2018   Ischemic cardiomyopathy 01/21/2018   Other constipation 05/07/2017   Colon cancer screening 04/19/2017   Incisional hernia 01/14/2017   Smokers' cough ([place]) 08/13/2016   Chronic respiratory failure ([place]) 07/11/2016   Sleep-related hypoventilation due to lower airway obstruction 05/20/2016   Periodic limb movements of sleep 44/11/4740   Chronic systolic congestive heart failure ([place]) 05/08/2016   Type 2 diabetes mellitus with diabetic polyneuropathy, with long-term current use of insulin ([place]) 12/13/2015   B12 deficiency 12/01/2015   History of CVA (cerebrovascular accident) 11/29/2015   Abnormal nuclear stress test 05/06/2015   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 of localized onset, not intractable, without status epilepticus ([place]) 03/22/2015   Cervical disc disorder with radiculopathy of cervical region 03/22/2015   Complex partial seizures ([place]) 09/16/2013   Encounter for Medicare annual wellness exam 06/16/2013   Antiplatelet or antithrombotic long-term use 06/08/2013   Seizures ([place]) 03/25/2013   Goiter 09/01/2012   Other screening mammogram 06/13/2012   Allergic rhinitis 06/13/2012   Carotid  artery disease ([place]) 08/20/2011   Obstructive sleep apnea 09/18/2007   TOBACCO USE 08/08/2007   Hyperlipidemia associated with type 2 diabetes mellitus ([place]) 03/26/2007   ANXIETY 03/26/2007   DEPRESSION 03/26/2007   Migraine headache 03/26/2007   CARPAL TUNNEL SYNDROME, BILATERAL 03/26/2007   Essential hypertension 03/26/2007   Hx of CABG 03/26/2007   GERD 03/26/2007   Claudication in peripheral vascular disease ([place]) 03/26/2007   Past Medical History:  Diagnosis Date   Anemia    Arthritis    Carotid stenosis    a. s/p right carotid stent 10/2017.   Chronic systolic CHF (congestive heart failure) (HCC)    Colon polyps    COPD (chronic obstructive pulmonary disease) ([place])    Coronary artery disease    post CABG in 3/07 , coronary stents    Depression    after major stroke years ago,  no longer treated   Diabetes mellitus    type 2   Dyslipidemia    Fatty liver    Fibromyalgia    GERD (gastroesophageal reflux disease)    Headache    hx of    History of kidney stones    History of radiation therapy    right lung 11/15/2021-12/26/2021  Dr Gery Pray   Hyperlipidemia    Hypertension    Lung cancer [facility])    Myocardial infarction ([place])    Pneumonia    hx of    Seizures ([place])    last seizure- 03/2013    Shortness of breath dyspnea    with exertion or when fluid builds up    Sleep apnea    used to wear a cpap- not used in 3 years    Status post dilation of esophageal narrowing    Stroke [facility]) 1993   problems with balance    Systolic murmur    known mild AS and MR   Thyroid goiter    Tobacco abuse    Past Surgical History:  Procedure Laterality Date   ABDOMINAL AORTOGRAM W/LOWER EXTREMITY N/A 04/18/2020   Procedure: ABDOMINAL AORTOGRAM W/LOWER EXTREMITY;  Surgeon: Lorretta Harp, MD;  Location: [place] CV LAB;  Service: Cardiovascular;  Laterality: N/A;   APPENDECTOMY     BIOPSY  02/03/2019   Procedure: BIOPSY;  Surgeon: Ladene Artist, MD;  Location: [facility]  ENDOSCOPY;  Service: Endoscopy;;   BREAST BIOPSY Left 2016   BRONCHIAL BIOPSY  09/18/2021   Procedure: BRONCHIAL BIOPSIES;  Surgeon: Collene Gobble, MD;  Location: [facility] ENDOSCOPY;  Service: Pulmonary;;   BRONCHIAL BRUSHINGS  09/18/2021   Procedure: BRONCHIAL BRUSHINGS;  Surgeon: Collene Gobble, MD;  Location: [facility] ENDOSCOPY;  Service: Pulmonary;;   BRONCHIAL NEEDLE ASPIRATION BIOPSY  09/18/2021   Procedure: BRONCHIAL NEEDLE ASPIRATION BIOPSIES;  Surgeon: Collene Gobble, MD;  Location: [facility] ENDOSCOPY;  Service: Pulmonary;;   CARDIAC CATHETERIZATION  11/29/05   EF of 55%   CARDIAC CATHETERIZATION  08/06/06   EF of 45-50%   CARDIAC CATHETERIZATION N/A 05/06/2015   Procedure: Right/Left Heart Cath and Coronary/Graft Angiography;  Surgeon: Peter M Martinique, MD;  Location: [place] CV LAB;  Service: Cardiovascular;  Laterality: N/A;   CAROTID PTA/STENT INTERVENTION N/A 10/10/2017   Procedure: CAROTID PTA/STENT INTERVENTION - Right;  Surgeon: Serafina Mitchell, MD;  Location: [place] CV LAB;  Service: Cardiovascular;  Laterality: N/A;   CERVICAL FUSION  1990   CHOLECYSTECTOMY     COLON RESECTION     mass removed and 4 in of colon   COLONOSCOPY WITH PROPOFOL N/A 07/16/2017   Procedure: COLONOSCOPY WITH PROPOFOL;  Surgeon: Ladene Artist, MD;  Location: [facility] ENDOSCOPY;  Service: Endoscopy;  Laterality: N/A;   CORONARY ARTERY BYPASS GRAFT  12/04/2005   x5 -- left internal mammary artery to the LAD, left radial artery to the ramus intermedius, saphenous vein graft to the obtuse marginal 1, sequential saphenous vein grat to the acute marginal and posterior descending, endoscopic vein harvesting from the left thigh with open vein harvest from right leg   CORONARY STENT PLACEMENT  08/11/06   PCI of her ciurcumflex/OM vessel   ESOPHAGOGASTRODUODENOSCOPY (EGD) WITH PROPOFOL N/A 02/03/2019   Procedure: ESOPHAGOGASTRODUODENOSCOPY (EGD) WITH PROPOFOL;  Surgeon: Ladene Artist, MD;  Location: [facility] ENDOSCOPY;  Service:  Endoscopy;  Laterality: N/A;   FINE NEEDLE ASPIRATION  09/18/2021   Procedure: FINE NEEDLE ASPIRATION (FNA) LINEAR;  Surgeon: Baltazar Apo  S, MD;  Location: [place];  Service: Pulmonary;;   ICD IMPLANT N/A 07/15/2020   Procedure: ICD IMPLANT;  Surgeon: Vickie Epley, MD;  Location: [place] CV LAB;  Service: Cardiovascular;  Laterality: N/A;   IR IMAGING GUIDED PORT INSERTION  10/20/2021   LAPAROTOMY Bilateral 05/19/2015   Procedure: EXPLORATORY LAPAROTOMY WITH BILATERAL SALPINGO OOPHORECTOMY /OMENTECTOMY/SEGMENTAL SIGMOID COLECTOMY ;  Surgeon: Everitt Amber, MD;  Location: [facility] ORS;  Service: Gynecology;  Laterality: Bilateral;   PERIPHERAL VASCULAR BALLOON ANGIOPLASTY  04/18/2020   Procedure: PERIPHERAL VASCULAR BALLOON ANGIOPLASTY;  Surgeon: Lorretta Harp, MD;  Location: [place] [facility] LAB;  Service: Cardiovascular;;  rt SFA  atherectomy and DCB   PERIPHERAL VASCULAR CATHETERIZATION N/A 11/15/2015   Procedure: Carotid PTA/Stent Intervention;  Surgeon: Lorretta Harp, MD;  Location: [place] CV LAB;  Service: Cardiovascular;  Laterality: N/A;   PERIPHERAL VASCULAR CATHETERIZATION  11/15/2015   Procedure: Carotid Angiography;  Surgeon: Lorretta Harp, MD;  Location: [place] CV LAB;  Service: Cardiovascular;;   RIGHT HEART CATH N/A 01/27/2020   Procedure: RIGHT HEART CATH;  Surgeon: Larey Dresser, MD;  Location: [place] [facility] LAB;  Service: Cardiovascular;  Laterality: N/A;   VIDEO BRONCHOSCOPY WITH ENDOBRONCHIAL ULTRASOUND N/A 09/18/2021   Procedure: VIDEO BRONCHOSCOPY WITH ENDOBRONCHIAL ULTRASOUND;  Surgeon: Collene Gobble, MD;  Location: [place] ENDOSCOPY;  Service: Pulmonary;  Laterality: N/A;   VIDEO BRONCHOSCOPY WITH RADIAL ENDOBRONCHIAL ULTRASOUND  09/18/2021   Procedure: RADIAL ENDOBRONCHIAL ULTRASOUND;  Surgeon: Collene Gobble, MD;  Location: [facility] ENDOSCOPY;  Service: Pulmonary;;   Social History   Tobacco Use   Smoking status: Every Day    Packs/day: 1.00     Years: 51.00    Total pack years: 51.00    Types: Cigarettes    Start date: 4    Last attempt to quit: 02/22/2020    Years since quitting: 2.6   Smokeless tobacco: Never   Tobacco comments:    Pt smokes a 1 pack a day 08/30/2021 / pl  Vaping Use   Vaping Use: Former  Substance Use Topics   Alcohol use: No   Drug use: No   Family History  Problem Relation Age of Onset   Heart disease Mother    Diabetes Mother    COPD Mother    Hyperlipidemia Mother    Hypertension Mother    Cancer Father        met, origin unknown   Heart attack Father    Hypertension Sister    Cancer Sister        eyelid   Glaucoma Sister    Heart disease Maternal Grandfather    Drug abuse Paternal Grandmother    Stroke Paternal Grandfather    Heart disease Maternal Aunt        x 2 aunts   Lung cancer Paternal Aunt        lung with mets to brain   Melanoma Paternal Uncle    Lung cancer Paternal Uncle        lung/liver to brain   Cancer Paternal Uncle        cancer of unknown type   Colon cancer Neg Hx    Stomach cancer Neg Hx    Esophageal cancer Neg Hx    Allergies  Allergen Reactions   Bee Venom Itching and Swelling   Benadryl [Diphenhydramine] Other (See Comments)    Insomnia    Entresto [Sacubitril-Valsartan] Cough   Farxiga [Dapagliflozin]     Yeast  infection   Tetracycline     Unknown, pt cannot recall exact reaction   Canagliflozin Other (See Comments)   Morphine Sulfate Er Other (See Comments)    "Severe chest pain"-pt will not take anymore   Sitagliptin Other (See Comments)   Ace Inhibitors Cough   Current Outpatient Medications on File Prior to Visit  Medication Sig Dispense Refill   acetaminophen (TYLENOL) 500 MG tablet Take 1,000 mg by mouth at bedtime.     albuterol (PROVENTIL) (2.5 MG/3ML) 0.083% nebulizer solution Take 3 mLs (2.5 mg total) by nebulization every 6 (six) hours as needed for wheezing or shortness of breath. 1180 mL 3   albuterol (VENTOLIN HFA) 108 (90  Base) MCG/ACT inhaler INHALE 2 PUFFS EVERY 6 HOURS AS NEEDED FOR WHEEZING/SHORTNESS OF BREATH 18 each 0   aspirin EC 81 MG tablet Take 81 mg by mouth every evening.  30 tablet 6   BD PEN NEEDLE NANO 2ND GEN 32G X 4 MM MISC Inject into the skin 3 (three) times daily.     clopidogrel (PLAVIX) 75 MG tablet TAKE 1 TABLET DAILY 90 tablet 3   dexlansoprazole (DEXILANT) 60 MG capsule TAKE 1 CAPSULE DAILY 90 capsule 1   digoxin (LANOXIN) 0.125 MG tablet TAKE 1/2 TABLET DAILY 45 tablet 3   diphenhydrAMINE (BENADRYL) 25 mg capsule Take 25 mg by mouth 2 (two) times daily as needed for itching or allergies.     ezetimibe (ZETIA) 10 MG tablet Take 1 tablet (10 mg total) by mouth daily. PLEASE CALL OFFICE FOR FOLLOW UP APPOINTMENT 90 tablet 3   fluticasone (FLONASE) 50 MCG/ACT nasal spray Place 2 sprays into both nostrils daily. 48 g 3   gabapentin (NEURONTIN) 300 MG capsule TAKE 2 CAPSULES DAILY AT   BEDTIME 180 capsule 1   glucose blood (ONETOUCH ULTRA) test strip 3 (three) times daily.     guaiFENesin (MUCINEX) 600 MG 12 hr tablet Take 2 tablets (1,200 mg total) by mouth 2 (two) times daily. 360 tablet 3   HYDROcodone-acetaminophen (NORCO) 5-325 MG tablet Take 1 tablet by mouth every 6 (six) hours as needed for moderate pain. 30 tablet 0   insulin aspart protamine- aspart (NOVOLOG MIX 70/30) (70-30) 100 UNIT/ML injection Inject 5-65 Units into the skin See admin instructions. 65 units in the Am with breakfast and 5 units with lunch and 55 units with dinner     levETIRAcetam (KEPPRA) 1000 MG tablet TAKE 1 TABLET TWICE A DAY 180 tablet 0   lidocaine-prilocaine (EMLA) cream Apply to the Port-A-Cath site 30-60 minutes before chemotherapy 30 g 0   metFORMIN (GLUCOPHAGE) 500 MG tablet Take 500 mg by mouth 2 (two) times daily with a meal.     metolazone (ZAROXOLYN) 2.5 MG tablet TAKE 1 TABLET TWICE WEEKLY,EVERY MONDAY AND FRIDAY    (CHANGE IN DOSAGE). 26 tablet 3   nitroGLYCERIN (NITROLINGUAL) 0.4 MG/SPRAY spray  Place 1 spray under the tongue every 5 (five) minutes x 3 doses as needed for chest pain. 12 g 3   nystatin (MYCOSTATIN/NYSTOP) powder APPLY 1 APPLICATION        TOPICALLY 4 TIMES A DAY AS NEEDED FOR YEAST INFECTIONS 60 g 5   ONE TOUCH ULTRA TEST test strip USE AS DIRECTED FOR TESTING BLOOD GLUCOSE 3 TIMES DAILY  1   polyethylene glycol (MIRALAX / GLYCOLAX) 17 g packet Take 17 g by mouth daily.     potassium chloride (KLOR-CON M) 10 MEQ tablet Take 10 mEq by mouth 2 (two) times  daily.     promethazine (PHENERGAN) 12.5 MG tablet Take 1 tablet (12.5 mg total) by mouth every 6 (six) hours as needed for nausea or vomiting. 30 tablet 2   ranolazine (RANEXA) 1000 MG SR tablet TAKE 1 TABLET TWICE A DAY 180 tablet 3   rosuvastatin (CRESTOR) 20 MG tablet Take 1 tablet (20 mg total) by mouth daily. PLEASE CALL OFFICE FOR FOLLOW UP APPOINTMENT 90 tablet 3   sodium chloride (OCEAN) 0.65 % SOLN nasal spray Place 1 spray into both nostrils as needed for congestion.     spironolactone (ALDACTONE) 25 MG tablet Take 1 tablet (25 mg total) by mouth at bedtime. PLEASE CALL OFFICE FOR FOLLOW UP APPOINTMENT 90 tablet 3   torsemide (DEMADEX) 20 MG tablet TAKE 3 TABLETS (60MG TOTAL)TWO TIMES A DAY 540 tablet 3   triamcinolone cream (KENALOG) 0.5 % Apply 1 application topically 3 (three) times daily as needed (rash). To affected areas     VASCEPA 1 g capsule TAKE 2 CAPSULES (2GRAMS    TOTAL) TWO TIMES A DAY 120 capsule 11   VERQUVO 10 MG TABS TAKE 1 TABLET ONCE DAILY 90 tablet 3   No current facility-administered medications on file prior to visit.    Review of Systems  Constitutional:  Positive for fatigue. Negative for activity change, appetite change, fever and unexpected weight change.  HENT:  Negative for congestion, ear pain, rhinorrhea, sinus pressure and sore throat.   Eyes:  Negative for pain, redness and visual disturbance.  Respiratory:  Positive for shortness of breath. Negative for cough and wheezing.         Baseline sob  Cardiovascular:  Negative for chest pain and palpitations.  Gastrointestinal:  Negative for abdominal pain, blood in stool, constipation and diarrhea.  Endocrine: Negative for polydipsia and polyuria.  Genitourinary:  Negative for dysuria, frequency and urgency.  Musculoskeletal:  Negative for arthralgias, back pain and myalgias.  Skin:  Negative for pallor and rash.  Allergic/Immunologic: Negative for environmental allergies.  Neurological:  Negative for dizziness, syncope and headaches.  Hematological:  Negative for adenopathy. Does not bruise/bleed easily.  Psychiatric/Behavioral:  Negative for decreased concentration and dysphoric mood. The patient is not nervous/anxious.        Objective:   Physical Exam Constitutional:      General: She is not in acute distress.    Appearance: Normal appearance. She is well-developed and normal weight. She is not ill-appearing or diaphoretic.  HENT:     Head: Normocephalic and atraumatic.     Right Ear: Tympanic membrane, ear canal and external ear normal.     Left Ear: Tympanic membrane, ear canal and external ear normal.     Nose: Nose normal. No congestion.     Mouth/Throat:     Mouth: Mucous membranes are moist.     Pharynx: Oropharynx is clear. No posterior oropharyngeal erythema.  Eyes:     General: No scleral icterus.    Extraocular Movements: Extraocular movements intact.     Conjunctiva/sclera: Conjunctivae normal.     Pupils: Pupils are equal, round, and reactive to light.  Neck:     Thyroid: No thyromegaly.     Vascular: No carotid bruit or JVD.  Cardiovascular:     Rate and Rhythm: Regular rhythm. Tachycardia present.     Heart sounds: Normal heart sounds.     No gallop.  Pulmonary:     Effort: Pulmonary effort is normal. No respiratory distress.     Breath sounds: Normal breath  sounds. No wheezing.     Comments: Diffusely distant bs  Occ smoker's cough Chest:     Chest wall: No tenderness.  Abdominal:      General: Bowel sounds are normal. There is no distension or abdominal bruit.     Palpations: Abdomen is soft. There is no mass.     Tenderness: There is no abdominal tenderness.     Hernia: No hernia is present.     Comments: Scars baseline  Genitourinary:    Comments: Breast exam: No mass, nodules, thickening, tenderness, bulging, retraction, inflamation, nipple discharge or skin changes noted.  No axillary or clavicular LA.     Musculoskeletal:        General: No tenderness. Normal range of motion.     Cervical back: Normal range of motion and neck supple. No rigidity. No muscular tenderness.     Right lower leg: No edema.     Left lower leg: No edema.     Comments: mild kyphosis   Lymphadenopathy:     Cervical: No cervical adenopathy.  Skin:    General: Skin is warm and dry.     Coloration: Skin is not pale.     Findings: No erythema or rash.     Comments: Solar lentigines diffusely   Neurological:     Mental Status: She is alert. Mental status is at baseline.     Cranial Nerves: No cranial nerve deficit.     Motor: No abnormal muscle tone.     Coordination: Coordination normal.     Gait: Gait normal.     Deep Tendon Reflexes: Reflexes are normal and symmetric. Reflexes normal.  Psychiatric:        Mood and Affect: Mood normal.        Cognition and Memory: Cognition and memory normal.           Assessment & Plan:   Problem List Items Addressed This Visit       Cardiovascular and Mediastinum   Essential hypertension - Primary    Well covered in the setting of ischemic cardiomyopathy bp in fair control at this time  BP Readings from Last 1 Encounters:  10/03/22 (!) 102/58  No changes needed (not dizzy with current bp) Under care of cardiology Most recent labs reviewed  Disc lifstyle change with [name] sodium diet and exercise  Plan to continue medications for CHF with Dr Marigene Ehlers Spironolactone 25 mg daily  demadex 20 mg daily Digoxin 0.0625 mg  daily  ranexa 1000 mg xr bid      Relevant Orders   Comprehensive metabolic panel   Lipid panel   TSH     Digestive   GERD    Continues dexilant  Worsened by rad tx for lung cancer        Endocrine   Goiter    Sees endocrinology  Tsh ordered       Hyperlipidemia associated with type 2 diabetes mellitus ([place])    Disc goals for lipids and reasons to control them Rev last labs with pt Rev [name] sat fat diet in detail  Some improvement in LDL, HDL and trig  Plan to continue  crestor 20 mg daily  zetia 10 mg daily  vascepa 2 g bid  Under care of cardiology Lab ordered today      Relevant Orders   Lipid panel   Type 2 diabetes mellitus with diabetic polyneuropathy, with long-term current use of insulin ([place])    Per pt A1c is in  7 range Doing better Under endo care She want to schedule her own eye exam         Musculoskeletal and Integument   Osteopenia    Dexa 08/2021  Several falls and 2 fractures   (now balance is better and no longer falling) Cannot afford vit D-will see if pharmacy will fill   Disc fall prev Would not recommend bisphosphonate in light of her swallowing issues lately        Other   B12 deficiency    Oral supplementation  Level ordered On ppi- cannot stop this      Relevant Orders   Vitamin B12   Screening mammogram, encounter for    Ordered mam at breast center She will call to schedule Nl exam      Relevant Orders   MM 3D SCREEN BREAST BILATERAL   Seizures ([place])    Keppra Seizure free  Under neuro care      TOBACCO USE    Disc in detail risks of smoking and possible outcomes including copd, vascular/ heart disease, cancer , respiratory and sinus infections  Pt voices understanding She is cutting down but not ready to quit   In setting of lung cancer, vasc dz and copd       Other Visit Diagnoses     Need for influenza vaccination       Relevant Orders   Flu Vaccine QUAD High Dose(Fluad) (Completed)

## 2022-10-04 LAB — LIPID PANEL
Cholesterol: 106 mg/dL (ref 0–200)
HDL: 38.5 mg/dL — ABNORMAL LOW (ref >39.00)
LDL Cholesterol: 30 mg/dL (ref 0–99)
NonHDL: 67.61
Total CHOL/HDL Ratio: 3
Triglycerides: 186 mg/dL — ABNORMAL HIGH (ref 0.0–149.0)
VLDL: 37.2 mg/dL (ref 0.0–40.0)

## 2022-10-04 LAB — COMPREHENSIVE METABOLIC PANEL
ALT: 10 U/L (ref 0–35)
AST: 14 U/L (ref 0–37)
Albumin: 4.6 g/dL (ref 3.5–5.2)
Alkaline Phosphatase: 65 U/L (ref 39–117)
BUN: 18 mg/dL (ref 6–23)
CO2: 33 mEq/L — ABNORMAL HIGH (ref 19–32)
Calcium: 10.1 mg/dL (ref 8.4–10.5)
Chloride: 90 mEq/L — ABNORMAL LOW (ref 96–112)
Creatinine, Ser: 0.79 mg/dL (ref 0.40–1.20)
GFR: 76.78 mL/min (ref >60.00)
Glucose, Bld: 186 mg/dL — ABNORMAL HIGH (ref 70–99)
Potassium: 4.1 mEq/L (ref 3.5–5.1)
Sodium: 133 mEq/L — ABNORMAL LOW (ref 135–145)
Total Bilirubin: 0.5 mg/dL (ref 0.2–1.2)
Total Protein: 7.3 g/dL (ref 6.0–8.3)

## 2022-10-04 LAB — TSH: TSH: 1.22 u[IU]/mL (ref 0.35–5.50)

## 2022-10-04 LAB — VITAMIN B12: Vitamin B-12: 283 pg/mL (ref 211–911)

## 2022-10-05 ENCOUNTER — Other Ambulatory Visit: Payer: Self-pay

## 2022-10-05 ENCOUNTER — Telehealth: Payer: Self-pay | Admitting: Pharmacist

## 2022-10-05 ENCOUNTER — Inpatient Hospital Stay: Payer: Medicare Other | Attending: Internal Medicine

## 2022-10-05 ENCOUNTER — Ambulatory Visit (HOSPITAL_COMMUNITY)
Admission: RE | Admit: 2022-10-05 | Discharge: 2022-10-05 | Disposition: A | Discharge status: Home / Self Care | Payer: Medicare Other | Source: Ambulatory Visit | Attending: Internal Medicine | Admitting: Internal Medicine

## 2022-10-05 DIAGNOSIS — C3490 Malignant neoplasm of unspecified part of unspecified bronchus or lung: Secondary | ICD-10-CM | POA: Diagnosis not present

## 2022-10-05 DIAGNOSIS — Z95828 Presence of other vascular implants and grafts: Secondary | ICD-10-CM

## 2022-10-05 DIAGNOSIS — D509 Iron deficiency anemia, unspecified: Secondary | ICD-10-CM

## 2022-10-05 DIAGNOSIS — C3411 Malignant neoplasm of upper lobe, right bronchus or lung: Secondary | ICD-10-CM | POA: Insufficient documentation

## 2022-10-05 DIAGNOSIS — Z720 Tobacco use: Secondary | ICD-10-CM | POA: Insufficient documentation

## 2022-10-05 DIAGNOSIS — J439 Emphysema, unspecified: Secondary | ICD-10-CM | POA: Diagnosis not present

## 2022-10-05 LAB — CBC WITH DIFFERENTIAL (CANCER CENTER ONLY)
Abs Immature Granulocytes: 0.03 10*3/uL (ref 0.00–0.07)
Basophils Absolute: 0 10*3/uL (ref 0.0–0.1)
Basophils Relative: 0 %
Eosinophils Absolute: 0.1 10*3/uL (ref 0.0–0.5)
Eosinophils Relative: 1 %
HCT: 30.1 % — ABNORMAL LOW (ref 36.0–46.0)
Hemoglobin: 8.9 g/dL — ABNORMAL LOW (ref 12.0–15.0)
Immature Granulocytes: 1 %
Lymphocytes Relative: 11 %
Lymphs Abs: 0.7 10*3/uL (ref 0.7–4.0)
MCH: 21.9 pg — ABNORMAL LOW (ref 26.0–34.0)
MCHC: 29.6 g/dL — ABNORMAL LOW (ref 30.0–36.0)
MCV: 74 fL — ABNORMAL LOW (ref 80.0–100.0)
Monocytes Absolute: 0.6 10*3/uL (ref 0.1–1.0)
Monocytes Relative: 10 %
Neutro Abs: 4.9 10*3/uL (ref 1.7–7.7)
Neutrophils Relative %: 77 %
Platelet Count: 232 10*3/uL (ref 150–400)
RBC: 4.07 MIL/uL (ref 3.87–5.11)
RDW: 19.1 % — ABNORMAL HIGH (ref 11.5–15.5)
WBC Count: 6.4 10*3/uL (ref 4.0–10.5)
nRBC: 0 % (ref 0.0–0.2)

## 2022-10-05 LAB — IRON AND IRON BINDING CAPACITY (CC-WL,HP ONLY)
Iron: 28 ug/dL (ref 28–170)
Saturation Ratios: 4 % — ABNORMAL LOW (ref 10.4–31.8)
TIBC: 715 ug/dL — ABNORMAL HIGH (ref 250–450)
UIBC: 687 ug/dL — ABNORMAL HIGH (ref 148–442)

## 2022-10-05 LAB — CMP (CANCER CENTER ONLY)
ALT: 9 U/L (ref 0–44)
AST: 12 U/L — ABNORMAL LOW (ref 15–41)
Albumin: 4.3 g/dL (ref 3.5–5.0)
Alkaline Phosphatase: 63 U/L (ref 38–126)
Anion gap: 8 (ref 5–15)
BUN: 16 mg/dL (ref 8–23)
CO2: 29 mmol/L (ref 22–32)
Calcium: 9.3 mg/dL (ref 8.9–10.3)
Chloride: 94 mmol/L — ABNORMAL LOW (ref 98–111)
Creatinine: 0.72 mg/dL (ref 0.44–1.00)
GFR, Estimated: >60 mL/min (ref >60)
Glucose, Bld: 142 mg/dL — ABNORMAL HIGH (ref 70–99)
Potassium: 3.6 mmol/L (ref 3.5–5.1)
Sodium: 131 mmol/L — ABNORMAL LOW (ref 135–145)
Total Bilirubin: 0.4 mg/dL (ref 0.3–1.2)
Total Protein: 6.7 g/dL (ref 6.5–8.1)

## 2022-10-05 LAB — FERRITIN: Ferritin: 5 ng/mL — ABNORMAL LOW (ref 11–307)

## 2022-10-05 MED ORDER — SODIUM CHLORIDE 0.9% FLUSH
10.0000 mL | Freq: Once | INTRAVENOUS | Status: AC
  Administered 2022-10-05: 10 mL

## 2022-10-05 MED ORDER — SODIUM CHLORIDE (PF) 0.9 % IJ SOLN
INTRAMUSCULAR | Status: AC
  Filled 2022-10-05: qty 50

## 2022-10-05 MED ORDER — IOHEXOL 300 MG/ML  SOLN
75.0000 mL | Freq: Once | INTRAMUSCULAR | Status: AC | PRN
  Administered 2022-10-05: 75 mL via INTRAVENOUS

## 2022-10-05 MED ORDER — HEPARIN SOD (PORK) LOCK FLUSH 100 UNIT/ML IV SOLN
INTRAVENOUS | Status: AC
  Filled 2022-10-05: qty 5

## 2022-10-05 MED ORDER — HEPARIN SOD (PORK) LOCK FLUSH 100 UNIT/ML IV SOLN
500.0000 [IU] | Freq: Once | INTRAVENOUS | Status: AC
  Administered 2022-10-05: 500 [IU] via INTRAVENOUS

## 2022-10-05 NOTE — Telephone Encounter (Signed)
Spoke with patient to assist with scheduling RSV and Covid vaccines at local pharmacy. Pt opted to use [facility] on [address]. Scheduled appt for 10/10/22 at 2:30 to receive both vaccines. Pt voiced understanding and appreciation.

## 2022-10-05 NOTE — Progress Notes (Unsigned)
[name] OFFICE PROGRESS NOTE  Tower, Diane Fanny, MD [place] [address]  DIAGNOSIS:  1) Limited stage (T3, N2, M0) small cell lung cancer presented with right hilar and mediastinal lymphadenopathy. There was no hypermetabolism in the left adrenal gland lesion. Diagnosed in December 2022. *** 2)  Iron Deficiency Anemia  PRIOR THERAPY: 1) Systemic chemotherapy with systemic chemotherapy with carboplatin for an AUC of 5, etoposide 100 mg per metered squared, and Imfinzi 1500 mg IV every 3 weeks with Cosela for myeloprotection.  First dose on 10/16/2021.  Discontinued after 1 cycle as the patient staging PET scan showed limited stage disease. 2) systemic chemotherapy with carboplatin for an AUC of 5 on day 1 and etoposide 100 mg/m2 on days 1, 2, and 3 IV every 3 weeks.  First dose on 11/06/2021.  This will be concurrent with radiotherapy under the care of Dr. Sondra Come.  Status post 3 cycles. 3) Venofer infusions as needed. Last dose on 11/07/21   CURRENT THERAPY: Observation   INTERVAL HISTORY: Diane [name] 67 y.o. female returns to clinic today for follow-up visit.  The patient was last seen by Dr. Julien Nordmann on 08/22/2022.  She had repeat CT scan and PET scan at that time and there was some findings that could be suggestive of lymphangitic carcinomatosis but not confirmed.  Dr. Julien Nordmann was hesitant to start patient on systemic chemotherapy without being certain that this is lymphangitic spread.  Therefore he recommended short interval follow-up and treatment with Medrol Dosepak and doxycycline for possible inflammatory lung changes.  Since last being seen the patient is feeling ***.  She recently saw her PCP last week for an annual physical.  Today she denies any fever, chills, night sweats, unexplained weight loss.  Denies any nausea, vomiting, diarrhea, or constipation.  Denies any headache or visual changes.  Breathing?  Smoker's cough?  Shortness of breath?  She  denies any chest pain or hemoptysis.  She denies any headache or visual changes.  She is compliant with her iron supplement?  Still smokes?  She recently had a restaging CT scan performed.  She is here today for evaluation to review her scan results and for more detailed discussion about her current condition and recommended treatment options.     MEDICAL HISTORY: Past Medical History:  Diagnosis Date   Anemia    Arthritis    Carotid stenosis    a. s/p right carotid stent 10/2017.   Chronic systolic CHF (congestive heart failure) (HCC)    Colon polyps    COPD (chronic obstructive pulmonary disease) (HCC)    Coronary artery disease    post CABG in 3/07 , coronary stents    Depression    after major stroke years ago, no longer treated   Diabetes mellitus    type 2   Dyslipidemia    Fatty liver    Fibromyalgia    GERD (gastroesophageal reflux disease)    Headache    hx of    History of kidney stones    History of radiation therapy    right lung 11/15/2021-12/26/2021  Dr Gery Pray   Hyperlipidemia    Hypertension    Lung cancer [facility])    Myocardial infarction ([place])    Pneumonia    hx of    Seizures ([place])    last seizure- 03/2013    Shortness of breath dyspnea    with exertion or when fluid builds up    Sleep apnea  used to wear a cpap- not used in 3 years    Status post dilation of esophageal narrowing    Stroke [facility]) 1993   problems with balance    Systolic murmur    known mild AS and MR   Thyroid goiter    Tobacco abuse     ALLERGIES:  is allergic to bee venom, benadryl [diphenhydramine], entresto [sacubitril-valsartan], farxiga [dapagliflozin], tetracycline, canagliflozin, morphine sulfate er, sitagliptin, and ace inhibitors.  MEDICATIONS:  Current Outpatient Medications  Medication Sig Dispense Refill   acetaminophen (TYLENOL) 500 MG tablet Take 1,000 mg by mouth at bedtime.     albuterol (PROVENTIL) (2.5 MG/3ML) 0.083% nebulizer solution Take 3 mLs (2.5 mg  total) by nebulization every 6 (six) hours as needed for wheezing or shortness of breath. 1180 mL 3   albuterol (VENTOLIN HFA) 108 (90 Base) MCG/ACT inhaler INHALE 2 PUFFS EVERY 6 HOURS AS NEEDED FOR WHEEZING/SHORTNESS OF BREATH 18 each 0   aspirin EC 81 MG tablet Take 81 mg by mouth every evening.  30 tablet 6   BD PEN NEEDLE NANO 2ND GEN 32G X 4 MM MISC Inject into the skin 3 (three) times daily.     Cholecalciferol (VITAMIN D3) 50 MCG (2000 UT) capsule Take 1 capsule (2,000 Units total) by mouth daily. 90 capsule 3   clopidogrel (PLAVIX) 75 MG tablet TAKE 1 TABLET DAILY 90 tablet 3   dexlansoprazole (DEXILANT) 60 MG capsule TAKE 1 CAPSULE DAILY 90 capsule 1   digoxin (LANOXIN) 0.125 MG tablet TAKE 1/2 TABLET DAILY 45 tablet 3   diphenhydrAMINE (BENADRYL) 25 mg capsule Take 25 mg by mouth 2 (two) times daily as needed for itching or allergies.     ezetimibe (ZETIA) 10 MG tablet Take 1 tablet (10 mg total) by mouth daily. PLEASE CALL OFFICE FOR FOLLOW UP APPOINTMENT 90 tablet 3   fluticasone (FLONASE) 50 MCG/ACT nasal spray Place 2 sprays into both nostrils daily. 48 g 3   gabapentin (NEURONTIN) 300 MG capsule TAKE 2 CAPSULES DAILY AT   BEDTIME 180 capsule 1   glucose blood (ONETOUCH ULTRA) test strip 3 (three) times daily.     guaiFENesin (MUCINEX) 600 MG 12 hr tablet Take 2 tablets (1,200 mg total) by mouth 2 (two) times daily. 360 tablet 3   HYDROcodone-acetaminophen (NORCO) 5-325 MG tablet Take 1 tablet by mouth every 6 (six) hours as needed for moderate pain. 30 tablet 0   insulin aspart protamine- aspart (NOVOLOG MIX 70/30) (70-30) 100 UNIT/ML injection Inject 5-65 Units into the skin See admin instructions. 65 units in the Am with breakfast and 5 units with lunch and 55 units with dinner     levETIRAcetam (KEPPRA) 1000 MG tablet TAKE 1 TABLET TWICE A DAY 180 tablet 0   lidocaine-prilocaine (EMLA) cream Apply to the Port-A-Cath site 30-60 minutes before chemotherapy 30 g 0   metFORMIN  (GLUCOPHAGE) 500 MG tablet Take 500 mg by mouth 2 (two) times daily with a meal.     metolazone (ZAROXOLYN) 2.5 MG tablet TAKE 1 TABLET TWICE WEEKLY,EVERY MONDAY AND FRIDAY    (CHANGE IN DOSAGE). 26 tablet 3   nitroGLYCERIN (NITROLINGUAL) 0.4 MG/SPRAY spray Place 1 spray under the tongue every 5 (five) minutes x 3 doses as needed for chest pain. 12 g 3   nystatin (MYCOSTATIN/NYSTOP) powder APPLY 1 APPLICATION        TOPICALLY 4 TIMES A DAY AS NEEDED FOR YEAST INFECTIONS 60 g 5   ONE TOUCH ULTRA TEST test strip  USE AS DIRECTED FOR TESTING BLOOD GLUCOSE 3 TIMES DAILY  1   polyethylene glycol (MIRALAX / GLYCOLAX) 17 g packet Take 17 g by mouth daily.     potassium chloride (KLOR-CON M) 10 MEQ tablet Take 10 mEq by mouth 2 (two) times daily.     promethazine (PHENERGAN) 12.5 MG tablet Take 1 tablet (12.5 mg total) by mouth every 6 (six) hours as needed for nausea or vomiting. 30 tablet 2   ranolazine (RANEXA) 1000 MG SR tablet TAKE 1 TABLET TWICE A DAY 180 tablet 3   rosuvastatin (CRESTOR) 20 MG tablet Take 1 tablet (20 mg total) by mouth daily. PLEASE CALL OFFICE FOR FOLLOW UP APPOINTMENT 90 tablet 3   sodium chloride (OCEAN) 0.65 % SOLN nasal spray Place 1 spray into both nostrils as needed for congestion.     spironolactone (ALDACTONE) 25 MG tablet Take 1 tablet (25 mg total) by mouth at bedtime. PLEASE CALL OFFICE FOR FOLLOW UP APPOINTMENT 90 tablet 3   torsemide (DEMADEX) 20 MG tablet TAKE 3 TABLETS (60MG  TOTAL)TWO TIMES A DAY 540 tablet 3   triamcinolone cream (KENALOG) 0.5 % Apply 1 application topically 3 (three) times daily as needed (rash). To affected areas     VASCEPA 1 g capsule TAKE 2 CAPSULES (2GRAMS    TOTAL) TWO TIMES A DAY 120 capsule 11   VERQUVO 10 MG TABS TAKE 1 TABLET ONCE DAILY 90 tablet 3   No current facility-administered medications for this visit.    SURGICAL HISTORY:  Past Surgical History:  Procedure Laterality Date   ABDOMINAL AORTOGRAM W/LOWER EXTREMITY N/A 04/18/2020    Procedure: ABDOMINAL AORTOGRAM W/LOWER EXTREMITY;  Surgeon: Lorretta Harp, MD;  Location: [place] [facility];  Service: Cardiovascular;  Laterality: N/A;   APPENDECTOMY     BIOPSY  02/03/2019   Procedure: BIOPSY;  Surgeon: Ladene Artist, MD;  Location: [facility];  Service: Endoscopy;;   BREAST BIOPSY Left 2016   BRONCHIAL BIOPSY  09/18/2021   Procedure: BRONCHIAL BIOPSIES;  Surgeon: Collene Gobble, MD;  Location: [facility] ENDOSCOPY;  Service: Pulmonary;;   BRONCHIAL BRUSHINGS  09/18/2021   Procedure: BRONCHIAL BRUSHINGS;  Surgeon: Collene Gobble, MD;  Location: [facility] ENDOSCOPY;  Service: Pulmonary;;   BRONCHIAL NEEDLE ASPIRATION BIOPSY  09/18/2021   Procedure: BRONCHIAL NEEDLE ASPIRATION BIOPSIES;  Surgeon: Collene Gobble, MD;  Location: [facility] ENDOSCOPY;  Service: Pulmonary;;   CARDIAC CATHETERIZATION  11/29/05   EF of 55%   CARDIAC CATHETERIZATION  08/06/06   EF of 45-50%   CARDIAC CATHETERIZATION N/A 05/06/2015   Procedure: Right/Left Heart Cath and Coronary/Graft Angiography;  Surgeon: Peter M Martinique, MD;  Location: [place] [facility];  Service: Cardiovascular;  Laterality: N/A;   CAROTID PTA/STENT INTERVENTION N/A 10/10/2017   Procedure: CAROTID PTA/STENT INTERVENTION - Right;  Surgeon: Serafina Mitchell, MD;  Location: [place] [facility];  Service: Cardiovascular;  Laterality: N/A;   CERVICAL FUSION  1990   CHOLECYSTECTOMY     COLON RESECTION     mass removed and 4 in of colon   COLONOSCOPY WITH PROPOFOL N/A 07/16/2017   Procedure: COLONOSCOPY WITH PROPOFOL;  Surgeon: Ladene Artist, MD;  Location: [facility];  Service: Endoscopy;  Laterality: N/A;   CORONARY ARTERY BYPASS GRAFT  12/04/2005   x5 -- left internal mammary artery to the LAD, left radial artery to the ramus intermedius, saphenous vein graft to the obtuse marginal 1, sequential saphenous vein grat to the acute marginal and posterior descending, endoscopic  vein harvesting from the left thigh with open vein harvest from right  leg   CORONARY STENT PLACEMENT  08/11/06   PCI of her ciurcumflex/OM vessel   ESOPHAGOGASTRODUODENOSCOPY (EGD) WITH PROPOFOL N/A 02/03/2019   Procedure: ESOPHAGOGASTRODUODENOSCOPY (EGD) WITH PROPOFOL;  Surgeon: Ladene Artist, MD;  Location: [facility];  Service: Endoscopy;  Laterality: N/A;   FINE NEEDLE ASPIRATION  09/18/2021   Procedure: FINE NEEDLE ASPIRATION (FNA) LINEAR;  Surgeon: Collene Gobble, MD;  Location: [facility] ENDOSCOPY;  Service: Pulmonary;;   ICD IMPLANT N/A 07/15/2020   Procedure: ICD IMPLANT;  Surgeon: Vickie Epley, MD;  Location: [place] [facility];  Service: Cardiovascular;  Laterality: N/A;   IR IMAGING GUIDED PORT INSERTION  10/20/2021   LAPAROTOMY Bilateral 05/19/2015   Procedure: EXPLORATORY LAPAROTOMY WITH BILATERAL SALPINGO OOPHORECTOMY /OMENTECTOMY/SEGMENTAL SIGMOID COLECTOMY ;  Surgeon: Everitt Amber, MD;  Location: [facility];  Service: Gynecology;  Laterality: Bilateral;   PERIPHERAL VASCULAR BALLOON ANGIOPLASTY  04/18/2020   Procedure: PERIPHERAL VASCULAR BALLOON ANGIOPLASTY;  Surgeon: Lorretta Harp, MD;  Location: [place] [facility];  Service: Cardiovascular;;  rt SFA  atherectomy and DCB   PERIPHERAL VASCULAR CATHETERIZATION N/A 11/15/2015   Procedure: Carotid PTA/Stent Intervention;  Surgeon: Lorretta Harp, MD;  Location: [place] [facility];  Service: Cardiovascular;  Laterality: N/A;   PERIPHERAL VASCULAR CATHETERIZATION  11/15/2015   Procedure: Carotid Angiography;  Surgeon: Lorretta Harp, MD;  Location: [place] [facility];  Service: Cardiovascular;;   RIGHT HEART CATH N/A 01/27/2020   Procedure: RIGHT HEART CATH;  Surgeon: Larey Dresser, MD;  Location: [place] [facility];  Service: Cardiovascular;  Laterality: N/A;   VIDEO BRONCHOSCOPY WITH ENDOBRONCHIAL ULTRASOUND N/A 09/18/2021   Procedure: VIDEO BRONCHOSCOPY WITH ENDOBRONCHIAL ULTRASOUND;  Surgeon: Collene Gobble, MD;  Location: [place] ENDOSCOPY;  Service: Pulmonary;  Laterality: N/A;   VIDEO  BRONCHOSCOPY WITH RADIAL ENDOBRONCHIAL ULTRASOUND  09/18/2021   Procedure: RADIAL ENDOBRONCHIAL ULTRASOUND;  Surgeon: Collene Gobble, MD;  Location: [facility] ENDOSCOPY;  Service: Pulmonary;;    REVIEW OF SYSTEMS:   Review of Systems  Constitutional: Negative for appetite change, chills, fatigue, fever and unexpected weight change.  HENT:   Negative for mouth sores, nosebleeds, sore throat and trouble swallowing.   Eyes: Negative for eye problems and icterus.  Respiratory: Negative for cough, hemoptysis, shortness of breath and wheezing.   Cardiovascular: Negative for chest pain and leg swelling.  Gastrointestinal: Negative for abdominal pain, constipation, diarrhea, nausea and vomiting.  Genitourinary: Negative for bladder incontinence, difficulty urinating, dysuria, frequency and hematuria.   Musculoskeletal: Negative for back pain, gait problem, neck pain and neck stiffness.  Skin: Negative for itching and rash.  Neurological: Negative for dizziness, extremity weakness, gait problem, headaches, light-headedness and seizures.  Hematological: Negative for adenopathy. Does not bruise/bleed easily.  Psychiatric/Behavioral: Negative for confusion, depression and sleep disturbance. The patient is not nervous/anxious.     PHYSICAL EXAMINATION:  Last menstrual period 09/02/2003.  ECOG PERFORMANCE STATUS: {CHL ONC ECOG [id number]  Physical Exam  Constitutional: Oriented to person, place, and time and well-developed, well-nourished, and in no distress. No distress.  HENT:  Head: Normocephalic and atraumatic.  Mouth/Throat: Oropharynx is clear and moist. No oropharyngeal exudate.  Eyes: Conjunctivae are normal. Right eye exhibits no discharge. Left eye exhibits no discharge. No scleral icterus.  Neck: Normal range of motion. Neck supple.  Cardiovascular: Normal rate, regular rhythm, normal heart sounds and intact distal pulses.   Pulmonary/Chest: Effort normal and breath sounds normal. No  respiratory distress. No wheezes. No rales.  Abdominal: Soft. Bowel sounds are normal. Exhibits no distension and no mass. There is no tenderness.  Musculoskeletal: Normal range of motion. Exhibits no edema.  Lymphadenopathy:    No cervical adenopathy.  Neurological: Alert and oriented to person, place, and time. Exhibits normal muscle tone. Gait normal. Coordination normal.  Skin: Skin is warm and dry. No rash noted. Not diaphoretic. No erythema. No pallor.  Psychiatric: Mood, memory and judgment normal.  Vitals reviewed.  LABORATORY DATA: Lab Results  Component Value Date   WBC 8.2 08/22/2022   HGB 9.7 (L) 08/22/2022   HCT 32.5 (L) 08/22/2022   MCV 74.0 (L) 08/22/2022   PLT 245 08/22/2022      Chemistry      Component Value Date/Time   NA 133 (L) 10/03/2022 1608   NA 135 04/14/2020 1544   NA 135 (L) 02/15/2017 1457   K 4.1 10/03/2022 1608   K 4.8 02/15/2017 1457   CL 90 (L) 10/03/2022 1608   CO2 33 (H) 10/03/2022 1608   CO2 26 02/15/2017 1457   BUN 18 10/03/2022 1608   BUN 17 04/14/2020 1544   BUN 10.8 02/15/2017 1457   CREATININE 0.79 10/03/2022 1608   CREATININE 1.01 (H) 08/22/2022 1335   CREATININE 0.69 05/06/2017 1405   CREATININE 0.7 02/15/2017 1457      Component Value Date/Time   CALCIUM 10.1 10/03/2022 1608   CALCIUM 9.3 02/15/2017 1457   ALKPHOS 65 10/03/2022 1608   ALKPHOS 75 02/15/2017 1457   AST 14 10/03/2022 1608   AST 20 08/22/2022 1335   AST 9 02/15/2017 1457   ALT 10 10/03/2022 1608   ALT 13 08/22/2022 1335   ALT 13 02/15/2017 1457   BILITOT 0.5 10/03/2022 1608   BILITOT 0.5 08/22/2022 1335   BILITOT 0.28 02/15/2017 1457       RADIOGRAPHIC STUDIES:  No results found.   ASSESSMENT/PLAN:  This is a very pleasant 67 year old Caucasian female diagnosed with limited stage (T3, N2, M0) small cell lung cancer presented with disease in the right upper lobe with right hilar and mediastinal lymphadenopathy . She was diagnosed December 2022.    She also was previously established with our clinic for iron deficiency anemia.  She receives as needed IV iron.  Her last iron infusion was on 11/07/2021.   She initially started treatment with carboplatin, etoposide, Cosela and Imfinzi with the expectation that she had extensive stage disease status post 1 cycle. Her treatment was switched to chemotherapy with with carboplatin for AUC of 5 on day 1, etoposide 100 Mg/M2 on days 1, 2 and 3 every 3 weeks concurrent with radiation. Status post 3 cycles.   The patient declined prophylactic cranial irradiation   Had a follow-up CT scan and PET scan that showed suspicious lymphangitic carcinomatosis but no hypermetabolic activity in the individual lung nodules therefore cannot confirm lymphangitic carcinomatosis.  Therefore the patient has completed Medrol Dosepak and doxycycline and had a short interval follow-up CT scan a few days ago.  The patient was seen with Dr. Julien Nordmann today.  Dr. Julien Nordmann had a lengthy discussion with the patient today about her current condition and recommended treatment options.  Dr. Julien Nordmann presented pending reviewed her scan discussed results with the patient today.  The scan showed ***  Dr. Julien Nordmann recommends ***  Follow-up Prescriptions I will arrange for    Iron supplement/Iron infusion***  The patient was advised to call immediately if she has any concerning  symptoms in the interval. The patient voices understanding of current disease status and treatment options and is in agreement with the current care plan. All questions were answered. The patient knows to call the clinic with any problems, questions or concerns. We can certainly see the patient much sooner if necessary       No orders of the defined types were placed in this encounter.    I spent {CHL ONC TIME VISIT - IYMEB:[id number]} counseling the patient face to face. The total time spent in the appointment was {CHL ONC TIME VISIT -  GSUPJ:[id number]}.  Ragina Fenter L Shandie Bertz, PA-C 10/05/22

## 2022-10-08 ENCOUNTER — Inpatient Hospital Stay (HOSPITAL_BASED_OUTPATIENT_CLINIC_OR_DEPARTMENT_OTHER): Payer: Medicare Other | Admitting: Physician Assistant

## 2022-10-08 VITALS — BP 120/56 | HR 102 | Temp 97.7°F | Resp 16 | Wt 119.9 lb

## 2022-10-08 DIAGNOSIS — D509 Iron deficiency anemia, unspecified: Secondary | ICD-10-CM | POA: Insufficient documentation

## 2022-10-08 DIAGNOSIS — C3411 Malignant neoplasm of upper lobe, right bronchus or lung: Secondary | ICD-10-CM | POA: Insufficient documentation

## 2022-10-08 DIAGNOSIS — C3491 Malignant neoplasm of unspecified part of right bronchus or lung: Secondary | ICD-10-CM | POA: Diagnosis not present

## 2022-10-08 DIAGNOSIS — C3490 Malignant neoplasm of unspecified part of unspecified bronchus or lung: Secondary | ICD-10-CM

## 2022-10-08 DIAGNOSIS — Z720 Tobacco use: Secondary | ICD-10-CM | POA: Insufficient documentation

## 2022-10-08 MED ORDER — FERROUS SULFATE 324 MG PO TBEC: 324.0000 mg | DELAYED_RELEASE_TABLET | Freq: Every day | ORAL | 2 refills | Status: DC

## 2022-10-10 ENCOUNTER — Encounter: Payer: Self-pay | Admitting: Physician Assistant

## 2022-10-10 DIAGNOSIS — Z23 Encounter for immunization: Secondary | ICD-10-CM | POA: Diagnosis not present

## 2022-10-15 ENCOUNTER — Telehealth: Payer: Self-pay | Admitting: Internal Medicine

## 2022-10-15 NOTE — Telephone Encounter (Signed)
Called patient regarding all upcoming appointments, patient is notified.

## 2022-10-17 ENCOUNTER — Inpatient Hospital Stay: Payer: Medicare Other

## 2022-10-17 ENCOUNTER — Other Ambulatory Visit: Payer: Self-pay

## 2022-10-17 VITALS — BP 123/80 | HR 89 | Temp 97.7°F | Resp 16

## 2022-10-17 DIAGNOSIS — D509 Iron deficiency anemia, unspecified: Secondary | ICD-10-CM

## 2022-10-17 DIAGNOSIS — C3411 Malignant neoplasm of upper lobe, right bronchus or lung: Secondary | ICD-10-CM | POA: Diagnosis not present

## 2022-10-17 DIAGNOSIS — Z720 Tobacco use: Secondary | ICD-10-CM | POA: Diagnosis not present

## 2022-10-17 DIAGNOSIS — Z95828 Presence of other vascular implants and grafts: Secondary | ICD-10-CM

## 2022-10-17 MED ORDER — ALBUTEROL SULFATE HFA 108 (90 BASE) MCG/ACT IN AERS: 2.0000 | INHALATION_SPRAY | Freq: Once | RESPIRATORY_TRACT | Status: DC | PRN

## 2022-10-17 MED ORDER — SODIUM CHLORIDE 0.9% FLUSH
10.0000 mL | Freq: Once | INTRAVENOUS | Status: AC | PRN
  Administered 2022-10-17: 10 mL

## 2022-10-17 MED ORDER — EPINEPHRINE 0.3 MG/0.3ML IJ SOAJ: 0.3000 mg | Freq: Once | INTRAMUSCULAR | Status: DC | PRN

## 2022-10-17 MED ORDER — SODIUM CHLORIDE 0.9 % IV SOLN
300.0000 mg | Freq: Once | INTRAVENOUS | Status: AC
  Administered 2022-10-17: 300 mg via INTRAVENOUS
  Filled 2022-10-17: qty 300

## 2022-10-17 MED ORDER — SODIUM CHLORIDE 0.9 % IV SOLN: Freq: Once | INTRAVENOUS | Status: AC

## 2022-10-17 MED ORDER — DIPHENHYDRAMINE HCL 50 MG/ML IJ SOLN: 50.0000 mg | Freq: Once | INTRAMUSCULAR | Status: DC | PRN

## 2022-10-17 MED ORDER — FAMOTIDINE IN NACL 20-0.9 MG/50ML-% IV SOLN: 20.0000 mg | Freq: Once | INTRAVENOUS | Status: DC | PRN

## 2022-10-17 MED ORDER — ACETAMINOPHEN 325 MG PO TABS
650.0000 mg | ORAL_TABLET | Freq: Once | ORAL | Status: AC
  Administered 2022-10-17: 650 mg via ORAL
  Filled 2022-10-17: qty 2

## 2022-10-17 MED ORDER — LORATADINE 10 MG PO TABS
10.0000 mg | ORAL_TABLET | Freq: Every day | ORAL | Status: DC
  Administered 2022-10-17: 10 mg via ORAL
  Filled 2022-10-17: qty 1

## 2022-10-17 MED ORDER — SODIUM CHLORIDE 0.9 % IV SOLN: Freq: Once | INTRAVENOUS | Status: DC | PRN

## 2022-10-17 MED ORDER — METHYLPREDNISOLONE SODIUM SUCC 125 MG IJ SOLR: 125.0000 mg | Freq: Once | INTRAMUSCULAR | Status: DC | PRN

## 2022-10-17 MED ORDER — HEPARIN SOD (PORK) LOCK FLUSH 100 UNIT/ML IV SOLN
500.0000 [IU] | Freq: Once | INTRAVENOUS | Status: AC | PRN
  Administered 2022-10-17: 500 [IU]

## 2022-10-17 NOTE — Progress Notes (Signed)
Patient did well with her iron- VSS- BP 108/63 (BP Location: Right Arm, Patient Position: Sitting)   Pulse 95   Temp 97.7 F (36.5 C) (Oral)   Resp 16   LMP 09/02/2003   SpO2 100%  With education- patient declined to stay for her 30 minute observation. Ambulated with FWW to the lobby.

## 2022-10-17 NOTE — Patient Instructions (Signed)
Iron Sucrose Injection What is this medication? IRON SUCROSE (EYE ern SOO krose) treats low levels of iron (iron deficiency anemia) in people with kidney disease. Iron is a mineral that plays an important role in making red blood cells, which carry oxygen from your lungs to the rest of your body. This medicine may be used for other purposes; ask your health care provider or pharmacist if you have questions. COMMON BRAND NAME(S): Venofer What should I tell my care team before I take this medication? They need to know if you have any of these conditions: Anemia not caused by low iron levels Heart disease High levels of iron in the blood Kidney disease Liver disease An unusual or allergic reaction to iron, other medications, foods, dyes, or preservatives Pregnant or trying to get pregnant Breastfeeding How should I use this medication? This medication is for infusion into a vein. It is given in a hospital or clinic setting. Talk to your care team about the use of this medication in children. While this medication may be prescribed for children as young as 2 years for selected conditions, precautions do apply. Overdosage: If you think you have taken too much of this medicine contact a poison control center or emergency room at once. NOTE: This medicine is only for you. Do not share this medicine with others. What if I miss a dose? Keep appointments for follow-up doses. It is important not to miss your dose. Call your care team if you are unable to keep an appointment. What may interact with this medication? Do not take this medication with any of the following: Deferoxamine Dimercaprol Other iron products This medication may also interact with the following: Chloramphenicol Deferasirox This list may not describe all possible interactions. Give your health care provider a list of all the medicines, herbs, non-prescription drugs, or dietary supplements you use. Also tell them if you smoke,  drink alcohol, or use illegal drugs. Some items may interact with your medicine. What should I watch for while using this medication? Visit your care team regularly. Tell your care team if your symptoms do not start to get better or if they get worse. You may need blood work done while you are taking this medication. You may need to follow a special diet. Talk to your care team. Foods that contain iron include: whole grains/cereals, dried fruits, beans, or peas, leafy green vegetables, and organ meats (liver, kidney). What side effects may I notice from receiving this medication? Side effects that you should report to your care team as soon as possible: Allergic reactions--skin rash, itching, hives, swelling of the face, lips, tongue, or throat Low blood pressure--dizziness, feeling faint or lightheaded, blurry vision Shortness of breath Side effects that usually do not require medical attention (report to your care team if they continue or are bothersome): Flushing Headache Joint pain Muscle pain Nausea Pain, redness, or irritation at injection site This list may not describe all possible side effects. Call your doctor for medical advice about side effects. You may report side effects to FDA at 1-800-FDA-1088. Where should I keep my medication? This medication is given in a hospital or clinic and will not be stored at home. NOTE: This sheet is a summary. It may not cover all possible information. If you have questions about this medicine, talk to your doctor, pharmacist, or health care provider.  2023 Elsevier/Gold Standard (2020-12-29 00:00:00)

## 2022-10-18 ENCOUNTER — Telehealth: Payer: Self-pay | Admitting: Family Medicine

## 2022-10-18 NOTE — Telephone Encounter (Signed)
Copied from CRM [id number]. Topic: Medicare AWV >> Oct 18, 2022 11:18 AM Gwenith Spitz wrote: Reason for CRM: lvm patient to confirm tele awv 10/22/2022 @ 2 with health coach Inetta Fermo for [facility]

## 2022-10-22 ENCOUNTER — Ambulatory Visit (INDEPENDENT_AMBULATORY_CARE_PROVIDER_SITE_OTHER): Payer: Medicare Other

## 2022-10-22 ENCOUNTER — Other Ambulatory Visit: Payer: Medicare Other

## 2022-10-22 VITALS — Wt 119.0 lb

## 2022-10-22 DIAGNOSIS — Z0000 Encounter for general adult medical examination without abnormal findings: Secondary | ICD-10-CM | POA: Diagnosis not present

## 2022-10-22 NOTE — Patient Instructions (Signed)
Diane [name] , Thank you for taking time to come for your Medicare Wellness Visit. I appreciate your ongoing commitment to your health goals. Please review the following plan we discussed and let me know if I can assist you in the future.   These are the goals we discussed:  Goals      Increase physical activity     Starting 04/14/2018, I will continue to exercise in pool 1-3 hours 2 days per week.      Patient Stated     Stay healthy      Quit smoking / using tobacco     04/20/2019, patient has decreased to 1 pack a day        This is a list of the screening recommended for you and due dates:  Health Maintenance  Topic Date Due   Yearly kidney health urinalysis for diabetes  06/09/2014   COVID-19 Vaccine (3 - Moderna risk series) 02/27/2020   Hemoglobin A1C  08/26/2020   Mammogram  10/25/2021   Eye exam for diabetics  09/04/2022   Zoster (Shingles) Vaccine (1 of 2) 01/02/2023*   Complete foot exam   10/04/2023   Yearly kidney function blood test for diabetes  10/06/2023   Medicare Annual Wellness Visit  10/23/2023   Colon Cancer Screening  07/17/2027   Pneumonia Vaccine  Completed   Flu Shot  Completed   DEXA scan (bone density measurement)  Completed   Hepatitis C Screening: USPSTF Recommendation to screen - Ages 73-79 yo.  Completed   HPV Vaccine  Aged Out   DTaP/Tdap/Td vaccine  Discontinued  *Topic was postponed. The date shown is not the original due date.    Advanced directives: Please bring a copy of your health care power of attorney and living will to the office at your convenience.  Conditions/risks identified: stay healthy as I can   Next appointment: Follow up in one year for your annual wellness visit    Preventive Care 65 Years and Older, Female Preventive care refers to lifestyle choices and visits with your health care provider that can promote health and wellness. What does preventive care include? A yearly physical exam. This is also called an annual  well check. Dental exams once or twice a year. Routine eye exams. Ask your health care provider how often you should have your eyes checked. Personal lifestyle choices, including: Daily care of your teeth and gums. Regular physical activity. Eating a healthy diet. Avoiding tobacco and drug use. Limiting alcohol use. Practicing safe sex. Taking low-dose aspirin every day. Taking vitamin and mineral supplements as recommended by your health care provider. What happens during an annual well check? The services and screenings done by your health care provider during your annual well check will depend on your age, overall health, lifestyle risk factors, and family history of disease. Counseling  Your health care provider may ask you questions about your: Alcohol use. Tobacco use. Drug use. Emotional well-being. Home and relationship well-being. Sexual activity. Eating habits. History of falls. Memory and ability to understand (cognition). Work and work Astronomer. Reproductive health. Screening  You may have the following tests or measurements: Height, weight, and BMI. Blood pressure. Lipid and cholesterol levels. These may be checked every 5 years, or more frequently if you are over 29 years old. Skin check. Lung cancer screening. You may have this screening every year starting at age 43 if you have a 30-pack-year history of smoking and currently smoke or have quit within the past  15 years. Fecal occult blood test (FOBT) of the stool. You may have this test every year starting at age 14. Flexible sigmoidoscopy or colonoscopy. You may have a sigmoidoscopy every 5 years or a colonoscopy every 10 years starting at age 87. Hepatitis C blood test. Hepatitis B blood test. Sexually transmitted disease (STD) testing. Diabetes screening. This is done by checking your blood sugar (glucose) after you have not eaten for a while (fasting). You may have this done every 1-3 years. Bone density  scan. This is done to screen for osteoporosis. You may have this done starting at age 23. Mammogram. This may be done every 1-2 years. Talk to your health care provider about how often you should have regular mammograms. Talk with your health care provider about your test results, treatment options, and if necessary, the need for more tests. Vaccines  Your health care provider may recommend certain vaccines, such as: Influenza vaccine. This is recommended every year. Tetanus, diphtheria, and acellular pertussis (Tdap, Td) vaccine. You may need a Td booster every 10 years. Zoster vaccine. You may need this after age 11. Pneumococcal 13-valent conjugate (PCV13) vaccine. One dose is recommended after age 71. Pneumococcal polysaccharide (PPSV23) vaccine. One dose is recommended after age 40. Talk to your health care provider about which screenings and vaccines you need and how often you need them. This information is not intended to replace advice given to you by your health care provider. Make sure you discuss any questions you have with your health care provider. Document Released: 10/14/2015 Document Revised: 06/06/2016 Document Reviewed: 07/19/2015 Elsevier Interactive Patient Education  2017 [place] Prevention in the Home Falls can cause injuries. They can happen to people of all ages. There are many things you can do to make your home safe and to help prevent falls. What can I do on the outside of my home? Regularly fix the edges of walkways and driveways and fix any cracks. Remove anything that might make you trip as you walk through a door, such as a raised step or threshold. Trim any bushes or trees on the path to your home. Use bright outdoor lighting. Clear any walking paths of anything that might make someone trip, such as rocks or tools. Regularly check to see if handrails are loose or broken. Make sure that both sides of any steps have handrails. Any raised decks and  porches should have guardrails on the edges. Have any leaves, snow, or ice cleared regularly. Use sand or salt on walking paths during winter. Clean up any spills in your garage right away. This includes oil or grease spills. What can I do in the bathroom? Use night lights. Install grab bars by the toilet and in the tub and shower. Do not use towel bars as grab bars. Use non-skid mats or decals in the tub or shower. If you need to sit down in the shower, use a plastic, non-slip stool. Keep the floor dry. Clean up any water that spills on the floor as soon as it happens. Remove soap buildup in the tub or shower regularly. Attach bath mats securely with double-sided non-slip rug tape. Do not have throw rugs and other things on the floor that can make you trip. What can I do in the bedroom? Use night lights. Make sure that you have a light by your bed that is easy to reach. Do not use any sheets or blankets that are too big for your bed. They should not hang  down onto the floor. Have a firm chair that has side arms. You can use this for support while you get dressed. Do not have throw rugs and other things on the floor that can make you trip. What can I do in the kitchen? Clean up any spills right away. Avoid walking on wet floors. Keep items that you use a lot in easy-to-reach places. If you need to reach something above you, use a strong step stool that has a grab bar. Keep electrical cords out of the way. Do not use floor polish or wax that makes floors slippery. If you must use wax, use non-skid floor wax. Do not have throw rugs and other things on the floor that can make you trip. What can I do with my stairs? Do not leave any items on the stairs. Make sure that there are handrails on both sides of the stairs and use them. Fix handrails that are broken or loose. Make sure that handrails are as long as the stairways. Check any carpeting to make sure that it is firmly attached to the  stairs. Fix any carpet that is loose or worn. Avoid having throw rugs at the top or bottom of the stairs. If you do have throw rugs, attach them to the floor with carpet tape. Make sure that you have a light switch at the top of the stairs and the bottom of the stairs. If you do not have them, ask someone to add them for you. What else can I do to help prevent falls? Wear shoes that: Do not have high heels. Have rubber bottoms. Are comfortable and fit you well. Are closed at the toe. Do not wear sandals. If you use a stepladder: Make sure that it is fully opened. Do not climb a closed stepladder. Make sure that both sides of the stepladder are locked into place. Ask someone to hold it for you, if possible. Clearly mark and make sure that you can see: Any grab bars or handrails. First and last steps. Where the edge of each step is. Use tools that help you move around (mobility aids) if they are needed. These include: Canes. Walkers. Scooters. Crutches. Turn on the lights when you go into a dark area. Replace any light bulbs as soon as they burn out. Set up your furniture so you have a clear path. Avoid moving your furniture around. If any of your floors are uneven, fix them. If there are any pets around you, be aware of where they are. Review your medicines with your doctor. Some medicines can make you feel dizzy. This can increase your chance of falling. Ask your doctor what other things that you can do to help prevent falls. This information is not intended to replace advice given to you by your health care provider. Make sure you discuss any questions you have with your health care provider. Document Released: 07/14/2009 Document Revised: 02/23/2016 Document Reviewed: 10/22/2014 Elsevier Interactive Patient Education  2017 [employer].

## 2022-10-22 NOTE — Progress Notes (Signed)
I connected with  Diane [name] on 10/22/22 by a audio enabled telemedicine application and verified that I am speaking with the correct person using two identifiers.  Patient Location: Home  Provider Location: Office/Clinic  I discussed the limitations of evaluation and management by telemedicine. The patient expressed understanding and agreed to proceed.   Subjective:   Diane [name] is a 67 y.o. female who presents for Medicare Annual (Subsequent) preventive examination.  Review of Systems     Cardiac Risk Factors include: advanced age (>61men, >66 women);diabetes mellitus;hypertension;dyslipidemia;obesity (BMI >30kg/m2);smoking/ tobacco exposure     Objective:    Today's Vitals   10/22/22 1410  Weight: 119 lb (54 kg)   Body mass index is 25.75 kg/m.     10/22/2022    2:16 PM 04/09/2022    2:54 PM 03/06/2022    3:44 PM 01/29/2022    2:35 PM 01/24/2022    3:12 PM 12/19/2021    4:13 PM 11/29/2021    2:37 PM  Advanced Directives  Does Patient Have a Medical Advance Directive? Yes Yes No Yes Yes Yes Yes  Type of Estate agent of [place];Living will    Healthcare Power of [place];Living will Healthcare Power of [place];Living will Living will;Healthcare Power of Attorney  Does patient want to make changes to medical advance directive?  No - Patient declined  No - Patient declined  No - Patient declined   Copy of Healthcare Power of Attorney in Chart? No - copy requested          Current Medications (verified) Outpatient Encounter Medications as of 10/22/2022  Medication Sig   acetaminophen (TYLENOL) 500 MG tablet Take 1,000 mg by mouth at bedtime.   albuterol (PROVENTIL) (2.5 MG/3ML) 0.083% nebulizer solution Take 3 mLs (2.5 mg total) by nebulization every 6 (six) hours as needed for wheezing or shortness of breath.   albuterol (VENTOLIN HFA) 108 (90 Base) MCG/ACT inhaler INHALE 2 PUFFS EVERY 6 HOURS AS NEEDED FOR WHEEZING/SHORTNESS OF BREATH   aspirin EC  81 MG tablet Take 81 mg by mouth every evening.    BD PEN NEEDLE NANO 2ND GEN 32G X 4 MM MISC Inject into the skin 3 (three) times daily.   Cholecalciferol (VITAMIN D3) 50 MCG (2000 UT) capsule Take 1 capsule (2,000 Units total) by mouth daily.   clopidogrel (PLAVIX) 75 MG tablet TAKE 1 TABLET DAILY   dexlansoprazole (DEXILANT) 60 MG capsule TAKE 1 CAPSULE DAILY   digoxin (LANOXIN) 0.125 MG tablet TAKE 1/2 TABLET DAILY   diphenhydrAMINE (BENADRYL) 25 mg capsule Take 25 mg by mouth 2 (two) times daily as needed for itching or allergies.   ezetimibe (ZETIA) 10 MG tablet Take 1 tablet (10 mg total) by mouth daily. PLEASE CALL OFFICE FOR FOLLOW UP APPOINTMENT   ferrous sulfate 324 MG TBEC Take 1 tablet (324 mg total) by mouth daily with breakfast.   fluticasone (FLONASE) 50 MCG/ACT nasal spray Place 2 sprays into both nostrils daily.   gabapentin (NEURONTIN) 300 MG capsule TAKE 2 CAPSULES DAILY AT   BEDTIME   glucose blood (ONETOUCH ULTRA) test strip 3 (three) times daily.   guaiFENesin (MUCINEX) 600 MG 12 hr tablet Take 2 tablets (1,200 mg total) by mouth 2 (two) times daily.   HYDROcodone-acetaminophen (NORCO) 5-325 MG tablet Take 1 tablet by mouth every 6 (six) hours as needed for moderate pain.   insulin aspart protamine- aspart (NOVOLOG MIX 70/30) (70-30) 100 UNIT/ML injection Inject 5-65 Units into the skin See  admin instructions. 65 units in the Am with breakfast and 5 units with lunch and 55 units with dinner   levETIRAcetam (KEPPRA) 1000 MG tablet TAKE 1 TABLET TWICE A DAY   lidocaine-prilocaine (EMLA) cream Apply to the Port-A-Cath site 30-60 minutes before chemotherapy   metFORMIN (GLUCOPHAGE) 500 MG tablet Take 500 mg by mouth 2 (two) times daily with a meal.   metolazone (ZAROXOLYN) 2.5 MG tablet TAKE 1 TABLET TWICE WEEKLY,EVERY MONDAY AND FRIDAY    (CHANGE IN DOSAGE).   nitroGLYCERIN (NITROLINGUAL) 0.4 MG/SPRAY spray Place 1 spray under the tongue every 5 (five) minutes x 3 doses as  needed for chest pain.   nystatin (MYCOSTATIN/NYSTOP) powder APPLY 1 APPLICATION        TOPICALLY 4 TIMES A DAY AS NEEDED FOR YEAST INFECTIONS   ONE TOUCH ULTRA TEST test strip USE AS DIRECTED FOR TESTING BLOOD GLUCOSE 3 TIMES DAILY   polyethylene glycol (MIRALAX / GLYCOLAX) 17 g packet Take 17 g by mouth daily.   potassium chloride (KLOR-CON M) 10 MEQ tablet Take 10 mEq by mouth 2 (two) times daily.   promethazine (PHENERGAN) 12.5 MG tablet Take 1 tablet (12.5 mg total) by mouth every 6 (six) hours as needed for nausea or vomiting.   ranolazine (RANEXA) 1000 MG SR tablet TAKE 1 TABLET TWICE A DAY   rosuvastatin (CRESTOR) 20 MG tablet Take 1 tablet (20 mg total) by mouth daily. PLEASE CALL OFFICE FOR FOLLOW UP APPOINTMENT   sodium chloride (OCEAN) 0.65 % SOLN nasal spray Place 1 spray into both nostrils as needed for congestion.   spironolactone (ALDACTONE) 25 MG tablet Take 1 tablet (25 mg total) by mouth at bedtime. PLEASE CALL OFFICE FOR FOLLOW UP APPOINTMENT   torsemide (DEMADEX) 20 MG tablet TAKE 3 TABLETS (60MG  TOTAL)TWO TIMES A DAY   triamcinolone cream (KENALOG) 0.5 % Apply 1 application topically 3 (three) times daily as needed (rash). To affected areas   VASCEPA 1 g capsule TAKE 2 CAPSULES (2GRAMS    TOTAL) TWO TIMES A DAY   VERQUVO 10 MG TABS TAKE 1 TABLET ONCE DAILY   No facility-administered encounter medications on file as of 10/22/2022.    Allergies (verified) Bee venom, Benadryl [diphenhydramine], Entresto [sacubitril-valsartan], Farxiga [dapagliflozin], Tetracycline, Canagliflozin, Morphine sulfate er, Sitagliptin, and Ace inhibitors   History: Past Medical History:  Diagnosis Date   Anemia    Arthritis    Carotid stenosis    a. s/p right carotid stent 10/2017.   Chronic systolic CHF (congestive heart failure) (HCC)    Colon polyps    COPD (chronic obstructive pulmonary disease) (HCC)    Coronary artery disease    post CABG in 3/07 , coronary stents    Depression     after major stroke years ago, no longer treated   Diabetes mellitus    type 2   Dyslipidemia    Fatty liver    Fibromyalgia    GERD (gastroesophageal reflux disease)    Headache    hx of    History of kidney stones    History of radiation therapy    right lung 11/15/2021-12/26/2021  Dr Gery Pray   Hyperlipidemia    Hypertension    Lung cancer [facility])    Myocardial infarction ([place])    Pneumonia    hx of    Seizures ([place])    last seizure- 03/2013    Shortness of breath dyspnea    with exertion or when fluid builds up    Sleep apnea  used to wear a cpap- not used in 3 years    Status post dilation of esophageal narrowing    Stroke [facility]) 1993   problems with balance    Systolic murmur    known mild AS and MR   Thyroid goiter    Tobacco abuse    Past Surgical History:  Procedure Laterality Date   ABDOMINAL AORTOGRAM W/LOWER EXTREMITY N/A 04/18/2020   Procedure: ABDOMINAL AORTOGRAM W/LOWER EXTREMITY;  Surgeon: Lorretta Harp, MD;  Location: [place] CV LAB;  Service: Cardiovascular;  Laterality: N/A;   APPENDECTOMY     BIOPSY  02/03/2019   Procedure: BIOPSY;  Surgeon: Ladene Artist, MD;  Location: [facility] ENDOSCOPY;  Service: Endoscopy;;   BREAST BIOPSY Left 2016   BRONCHIAL BIOPSY  09/18/2021   Procedure: BRONCHIAL BIOPSIES;  Surgeon: Collene Gobble, MD;  Location: [facility] ENDOSCOPY;  Service: Pulmonary;;   BRONCHIAL BRUSHINGS  09/18/2021   Procedure: BRONCHIAL BRUSHINGS;  Surgeon: Collene Gobble, MD;  Location[facility] ENDOSCOPY;  Service: Pulmonary;;   BRONCHIAL NEEDLE ASPIRATION BIOPSY  09/18/2021   Procedure: BRONCHIAL NEEDLE ASPIRATION BIOPSIES;  Surgeon: Collene Gobble, MD;  Location: [facility] ENDOSCOPY;  Service: Pulmonary;;   CARDIAC CATHETERIZATION  11/29/05   EF of 55%   CARDIAC CATHETERIZATION  08/06/06   EF of 45-50%   CARDIAC CATHETERIZATION N/A 05/06/2015   Procedure: Right/Left Heart Cath and Coronary/Graft Angiography;  Surgeon: Peter M Martinique, MD;  Location: [name]  CV LAB;  Service: Cardiovascular;  Laterality: N/A;   CAROTID PTA/STENT INTERVENTION N/A 10/10/2017   Procedure: CAROTID PTA/STENT INTERVENTION - Right;  Surgeon: Serafina Mitchell, MD;  Location: [place] CV LAB;  Service: Cardiovascular;  Laterality: N/A;   CERVICAL FUSION  1990   CHOLECYSTECTOMY     COLON RESECTION     mass removed and 4 in of colon   COLONOSCOPY WITH PROPOFOL N/A 07/16/2017   Procedure: COLONOSCOPY WITH PROPOFOL;  Surgeon: Ladene Artist, MD;  Location: [facility] ENDOSCOPY;  Service: Endoscopy;  Laterality: N/A;   CORONARY ARTERY BYPASS GRAFT  12/04/2005   x5 -- left internal mammary artery to the LAD, left radial artery to the ramus intermedius, saphenous vein graft to the obtuse marginal 1, sequential saphenous vein grat to the acute marginal and posterior descending, endoscopic vein harvesting from the left thigh with open vein harvest from right leg   CORONARY STENT PLACEMENT  08/11/06   PCI of her ciurcumflex/OM vessel   ESOPHAGOGASTRODUODENOSCOPY (EGD) WITH PROPOFOL N/A 02/03/2019   Procedure: ESOPHAGOGASTRODUODENOSCOPY (EGD) WITH PROPOFOL;  Surgeon: Ladene Artist, MD;  Location: [facility] ENDOSCOPY;  Service: Endoscopy;  Laterality: N/A;   FINE NEEDLE ASPIRATION  09/18/2021   Procedure: FINE NEEDLE ASPIRATION (FNA) LINEAR;  Surgeon: Collene Gobble, MD;  Location: [facility] ENDOSCOPY;  Service: Pulmonary;;   ICD IMPLANT N/A 07/15/2020   Procedure: ICD IMPLANT;  Surgeon: Vickie Epley, MD;  Location: [place] CV LAB;  Service: Cardiovascular;  Laterality: N/A;   IR IMAGING GUIDED PORT INSERTION  10/20/2021   LAPAROTOMY Bilateral 05/19/2015   Procedure: EXPLORATORY LAPAROTOMY WITH BILATERAL SALPINGO OOPHORECTOMY /OMENTECTOMY/SEGMENTAL SIGMOID COLECTOMY ;  Surgeon: Everitt Amber, MD;  Location: [facility] ORS;  Service: Gynecology;  Laterality: Bilateral;   PERIPHERAL VASCULAR BALLOON ANGIOPLASTY  04/18/2020   Procedure: PERIPHERAL VASCULAR BALLOON ANGIOPLASTY;  Surgeon: Lorretta Harp, MD;   Location: [place] CV LAB;  Service: Cardiovascular;;  rt SFA  atherectomy and DCB   PERIPHERAL VASCULAR CATHETERIZATION N/A 11/15/2015   Procedure: Carotid  PTA/Stent Intervention;  Surgeon: Runell Gess, MD;  Location: [facility] INVASIVE CV LAB;  Service: Cardiovascular;  Laterality: N/A;   PERIPHERAL VASCULAR CATHETERIZATION  11/15/2015   Procedure: Carotid Angiography;  Surgeon: Runell Gess, MD;  Location: [facility] INVASIVE CV LAB;  Service: Cardiovascular;;   RIGHT HEART CATH N/A 01/27/2020   Procedure: RIGHT HEART CATH;  Surgeon: Laurey Morale, MD;  Location: [facility] INVASIVE CV LAB;  Service: Cardiovascular;  Laterality: N/A;   VIDEO BRONCHOSCOPY WITH ENDOBRONCHIAL ULTRASOUND N/A 09/18/2021   Procedure: VIDEO BRONCHOSCOPY WITH ENDOBRONCHIAL ULTRASOUND;  Surgeon: Leslye Peer, MD;  Location: [facility] ENDOSCOPY;  Service: Pulmonary;  Laterality: N/A;   VIDEO BRONCHOSCOPY WITH RADIAL ENDOBRONCHIAL ULTRASOUND  09/18/2021   Procedure: RADIAL ENDOBRONCHIAL ULTRASOUND;  Surgeon: Leslye Peer, MD;  Location: [facility] ENDOSCOPY;  Service: Pulmonary;;   Family History  Problem Relation Age of Onset   Heart disease Mother    Diabetes Mother    COPD Mother    Hyperlipidemia Mother    Hypertension Mother    Cancer Father        met, origin unknown   Heart attack Father    Hypertension Sister    Cancer Sister        eyelid   Glaucoma Sister    Heart disease Maternal Grandfather    Drug abuse Paternal Grandmother    Stroke Paternal Grandfather    Heart disease Maternal Aunt        x 2 aunts   Lung cancer Paternal Aunt        lung with mets to brain   Melanoma Paternal Uncle    Lung cancer Paternal Uncle        lung/liver to brain   Cancer Paternal Uncle        cancer of unknown type   Colon cancer Neg Hx    Stomach cancer Neg Hx    Esophageal cancer Neg Hx    Social History   Socioeconomic History   Marital status: Divorced    Spouse name: Not on file   Number of children: 0   Years of  education: Not on file   Highest education level: Not on file  Occupational History   Occupation: disabled  Tobacco Use   Smoking status: Every Day    Packs/day: 0.50    Years: 51.00    Total pack years: 25.50    Types: Cigarettes    Start date: 58    Last attempt to quit: 02/22/2020    Years since quitting: 2.6   Smokeless tobacco: Never   Tobacco comments:    Pt smokes a 1 pack a day 08/30/2021 / pl  Vaping Use   Vaping Use: Former  Substance and Sexual Activity   Alcohol use: No   Drug use: No   Sexual activity: Not Currently    Partners: Male    Birth control/protection: None  Other Topics Concern   Not on file  Social History Narrative   Right handed   Lives alone in a one story home   Social Determinants of Health   Financial Resource Strain: Low Risk  (10/22/2022)   Overall Financial Resource Strain (CARDIA)    Difficulty of Paying Living Expenses: Not hard at all  Food Insecurity: No Food Insecurity (10/22/2022)   Hunger Vital Sign    Worried About Running Out of Food in the Last Year: Never true    Ran Out of Food in the Last Year: Never true  Transportation Needs: No  Transportation Needs (10/22/2022)   PRAPARE - Administrator, Civil Service (Medical): No    Lack of Transportation (Non-Medical): No  Physical Activity: Inactive (10/22/2022)   Exercise Vital Sign    Days of Exercise per Week: 0 days    Minutes of Exercise per Session: 0 min  Stress: No Stress Concern Present (10/22/2022)   [employer] of Occupational Health - Occupational Stress Questionnaire    Feeling of Stress : Not at all  Social Connections: Socially Isolated (10/22/2022)   Social Connection and Isolation Panel [NHANES]    Frequency of Communication with Friends and Family: More than three times a week    Frequency of Social Gatherings with Friends and Family: Once a week    Attends Religious Services: Never    Database administrator or Organizations: No    Attends Probation officer: Never    Marital Status: Divorced    Tobacco Counseling Ready to quit: Not Answered Counseling given: Not Answered Tobacco comments: Pt smokes a 1 pack a day 08/30/2021 / pl   Clinical Intake:  Pre-visit preparation completed: Yes  Pain : No/denies pain     BMI - recorded: 25.75 Nutritional Status: BMI 25 -29 Overweight Nutritional Risks: None Diabetes: Yes CBG done?: Yes (146 per pt) CBG resulted in Enter/ Edit results?: No Did pt. bring in CBG monitor from home?: No  How often do you need to have someone help you when you read instructions, pamphlets, or other written materials from your doctor or pharmacy?: 1 - Never  Diabetic?Nutrition Risk Assessment:  Has the patient had any N/V/D within the last 2 months?  No  Does the patient have any non-healing wounds?  No  Has the patient had any unintentional weight loss or weight gain?  No   Diabetes:  Is the patient diabetic?  Yes  If diabetic, was a CBG obtained today?  Yes  Did the patient bring in their glucometer from home?  No  How often do you monitor your CBG's? Daily .   Financial Strains and Diabetes Management:  Are you having any financial strains with the device, your supplies or your medication? No .  Does the patient want to be seen by Chronic Care Management for management of their diabetes?  No  Would the patient like to be referred to a Nutritionist or for Diabetic Management?  No   Diabetic Exams:  Diabetic Eye Exam: Overdue for diabetic eye exam. Pt has been advised about the importance in completing this exam. Patient advised to call and schedule an eye exam. Diabetic Foot Exam: Completed 10/03/22  Interpreter Needed?: No  Information entered by :: Lanier Ensign, LPN   Activities of Daily Living    10/22/2022    2:18 PM  In your present state of health, do you have any difficulty performing the following activities:  Hearing? 0  Vision? 0  Difficulty  concentrating or making decisions? 0  Walking or climbing stairs? 1  Comment avoid stairs  Dressing or bathing? 1  Comment at times  Doing errands, shopping? 0  Preparing Food and eating ? Y  Comment at times  Using the Toilet? N  In the past six months, have you accidently leaked urine? N  Do you have problems with loss of bowel control? N  Managing your Medications? N  Managing your Finances? N  Housekeeping or managing your Housekeeping? Y  Comment at times    Patient Care Team: Tower, Audrie Gallus,  MD as PCP - General [place], Peter M, MD as PCP - Cardiology (Cardiology) Lanier Prude, MD as PCP - Electrophysiology (Cardiology) Axel Filler, MD as Consulting Physician (General Surgery) Adolphus Birchwood, MD as Consulting Physician (Obstetrics and Gynecology) Coralyn Helling, MD as Consulting Physician (Pulmonary Disease) Pennie Rushing Maris Berger, MD (Inactive) as Consulting Physician (Obstetrics and Gynecology) Dorisann Frames, MD as Consulting Physician (Endocrinology) Blima Ledger, OD (Optometry) [place], Peter M, MD as Consulting Physician (Cardiology) Runell Gess, MD as Consulting Physician (Cardiology) Pollyann Savoy, MD as Consulting Physician (Rheumatology) Phil Dopp, [facility] as Pharmacist (Pharmacist)  Indicate any recent Medical Services you may have received from other than [facility] providers in the past year (date may be approximate).     Assessment:   This is a routine wellness examination for Clyda.  Hearing/Vision screen Hearing Screening - Comments:: Pt denies any hearing issues  Vision Screening - Comments:: Pt follows up with Dr Blima Ledger for annual eye exams   Dietary issues and exercise activities discussed: Current Exercise Habits: The patient does not participate in regular exercise at present   Goals Addressed             This Visit's Progress    Patient Stated       Stay healthy as I can        Depression Screen    10/22/2022     2:17 PM 10/03/2022    4:31 PM 06/26/2021    2:07 PM 09/13/2020    3:03 PM 06/07/2020   12:25 PM 04/20/2019    2:16 PM 04/14/2018   12:58 PM  PHQ 2/9 Scores  PHQ - 2 Score 0 0 0 0 0 0 0  PHQ- 9 Score 0 5  2  3  0    Fall Risk    10/22/2022    2:16 PM 03/06/2022    3:44 PM 06/26/2021    2:07 PM 06/07/2020   12:25 PM 03/10/2020    3:32 PM  Fall Risk   Falls in the past year? 1 0 1 1 0  Number falls in past yr: 1 0 0 1 0  Injury with Fall? 1 0 1 1 0  Comment head and broke ribs, ankle related to legs giving out      Risk for fall due to : Impaired balance/gait;History of fall(s);Impaired mobility;Impaired vision   History of fall(s)   Follow up Falls prevention discussed  Falls evaluation completed Falls evaluation completed     FALL RISK PREVENTION PERTAINING TO THE HOME:  Any stairs in or around the home? No  If so, are there any without handrails? No  Home free of loose throw rugs in walkways, pet beds, electrical cords, etc? Yes  Adequate lighting in your home to reduce risk of falls? Yes   ASSISTIVE DEVICES UTILIZED TO PREVENT FALLS:  Life alert? No  Use of a cane, walker or w/c? Yes  Grab bars in the bathroom? Yes  Shower chair or bench in shower? Yes  Elevated toilet seat or a handicapped toilet? No   TIMED UP AND GO:  Was the test performed? No .    Cognitive Function:    04/20/2019    2:26 PM 04/14/2018    1:02 PM 04/11/2017    2:03 PM  MMSE - Mini Mental State Exam  Orientation to time 5 5 5   Orientation to Place 5 5 5   Registration 3 3 3   Attention/ Calculation 5 0 0  Recall 3 3 3   Language-  name 2 objects 0 0 0  Language- repeat 1 1 1   Language- follow 3 step command 0 3 3  Language- read & follow direction 0 0 0  Write a sentence 0 0 0  Copy design 0 0 0  Total score 22 20 20         10/22/2022    2:21 PM  6CIT Screen  What Year? 0 points  What month? 0 points  What time? 0 points  Count back from 20 0 points  Months in reverse 0 points  Repeat  phrase 0 points  Total Score 0 points    Immunizations Immunization History  Administered Date(s) Administered   Fluad Quad(high Dose 65+) 06/23/2019, 06/07/2020, 06/26/2021, 10/03/2022   Influenza Whole 06/01/2012   Influenza,inj,Quad PF,6+ Mos 06/16/2013, 06/16/2014, 06/21/2015, 08/13/2016, 10/11/2017, 07/15/2018   Moderna Sars-Covid-2 Vaccination 12/31/2019, 01/30/2020   Pneumococcal Conjugate-13 05/11/2016   Pneumococcal Polysaccharide-23 11/18/1998, 11/04/2008, 06/16/2014, 06/23/2019   Td 10/01/1996, 11/15/2009    TDAP status: Due, Education has been provided regarding the importance of this vaccine. Advised may receive this vaccine at local pharmacy or Health Dept. Aware to provide a copy of the vaccination record if obtained from local pharmacy or Health Dept. Verbalized acceptance and understanding.  Flu Vaccine status: Up to date  Pneumococcal vaccine status: Up to date  Covid-19 vaccine status: Completed vaccines  Qualifies for Shingles Vaccine? Yes   Zostavax completed No   Shingrix Completed?: No.    Education has been provided regarding the importance of this vaccine. Patient has been advised to call insurance company to determine out of pocket expense if they have not yet received this vaccine. Advised may also receive vaccine at local pharmacy or Health Dept. Verbalized acceptance and understanding.  Screening Tests Health Maintenance  Topic Date Due   Diabetic kidney evaluation - Urine ACR  06/09/2014   COVID-19 Vaccine (3 - Moderna risk series) 02/27/2020   HEMOGLOBIN A1C  08/26/2020   MAMMOGRAM  10/25/2021   OPHTHALMOLOGY EXAM  09/04/2022   Zoster Vaccines- Shingrix (1 of 2) 01/02/2023 (Originally 03/10/1975)   FOOT EXAM  10/04/2023   Diabetic kidney evaluation - eGFR measurement  10/06/2023   Medicare Annual Wellness (AWV)  10/23/2023   COLONOSCOPY (Pts 45-78yrs Insurance coverage will need to be confirmed)  07/17/2027   Pneumonia Vaccine 71+ Years old   Completed   INFLUENZA VACCINE  Completed   DEXA SCAN  Completed   Hepatitis C Screening  Completed   HPV VACCINES  Aged Out   DTaP/Tdap/Td  Discontinued    Health Maintenance  Health Maintenance Due  Topic Date Due   Diabetic kidney evaluation - Urine ACR  06/09/2014   COVID-19 Vaccine (3 - Moderna risk series) 02/27/2020   HEMOGLOBIN A1C  08/26/2020   MAMMOGRAM  10/25/2021   OPHTHALMOLOGY EXAM  09/04/2022    Colorectal cancer screening: Type of screening: Colonoscopy. Completed 07/16/17. Repeat every 10 years  Mammogram status: Ordered per chart . Pt provided with contact info and advised to call to schedule appt.   Bone Density status: Completed 08/16/21. Results reflect: Bone density results: OSTEOPENIA. Repeat every 2 years.  Lung Cancer Screening: (Low Dose CT Chest recommended if Age 54-80 years, 30 pack-year currently smoking OR have quit w/in 15years.) does qualify.   Lung Cancer Screening Referral: pt has  ct chest w contrast in chart   Additional Screening:  Hepatitis C Screening:  Completed 08/13/16  Vision Screening: Recommended annual ophthalmology exams for early detection of glaucoma and  other disorders of the eye. Is the patient up to date with their annual eye exam?  No  Who is the provider or what is the name of the office in which the patient attends annual eye exams? Dr Blima Ledger  If pt is not established with a provider, would they like to be referred to a provider to establish care? No .   Dental Screening: Recommended annual dental exams for proper oral hygiene  Community Resource Referral / Chronic Care Management: CRR required this visit?  No   CCM required this visit?  No      Plan:     I have personally reviewed and noted the following in the patient's chart:   Medical and social history Use of alcohol, tobacco or illicit drugs  Current medications and supplements including opioid prescriptions. Patient is currently taking opioid  prescriptions. Information provided to patient regarding non-opioid alternatives. Patient advised to discuss non-opioid treatment plan with their provider. Functional ability and status Nutritional status Physical activity Advanced directives List of other physicians Hospitalizations, surgeries, and ER visits in previous 12 months Vitals Screenings to include cognitive, depression, and falls Referrals and appointments  In addition, I have reviewed and discussed with patient certain preventive protocols, quality metrics, and best practice recommendations. A written personalized care plan for preventive services as well as general preventive health recommendations were provided to patient.     Marzella Schlein, LPN   [id number]   Nurse Notes: none

## 2022-10-23 ENCOUNTER — Ambulatory Visit: Payer: Medicare Other | Attending: Cardiology

## 2022-10-23 DIAGNOSIS — I255 Ischemic cardiomyopathy: Secondary | ICD-10-CM

## 2022-10-23 LAB — CUP PACEART REMOTE DEVICE CHECK
Battery Remaining Longevity: 150 mo
Battery Remaining Percentage: 100 %
Brady Statistic RV Percent Paced: 0 %
Date Time Interrogation Session: 20240123041100
HighPow Impedance: 73 Ohm
Implantable Lead Connection Status: 753985
Implantable Lead Implant Date: 20211015
Implantable Lead Location: 753860
Implantable Lead Model: 672
Implantable Lead Serial Number: 166771
Implantable Pulse Generator Implant Date: 20211015
Lead Channel Impedance Value: 952 Ohm
Lead Channel Setting Pacing Amplitude: 3.5 V
Lead Channel Setting Pacing Pulse Width: 0.4 ms
Lead Channel Setting Sensing Sensitivity: 0.5 mV
Pulse Gen Serial Number: 279742
Zone Setting Status: 755011

## 2022-10-24 ENCOUNTER — Inpatient Hospital Stay: Payer: Medicare Other

## 2022-10-24 ENCOUNTER — Other Ambulatory Visit: Payer: Self-pay

## 2022-10-24 VITALS — BP 113/58 | HR 95 | Temp 97.6°F | Resp 18

## 2022-10-24 DIAGNOSIS — C3411 Malignant neoplasm of upper lobe, right bronchus or lung: Secondary | ICD-10-CM | POA: Diagnosis not present

## 2022-10-24 DIAGNOSIS — Z720 Tobacco use: Secondary | ICD-10-CM | POA: Diagnosis not present

## 2022-10-24 DIAGNOSIS — Z95828 Presence of other vascular implants and grafts: Secondary | ICD-10-CM

## 2022-10-24 DIAGNOSIS — D509 Iron deficiency anemia, unspecified: Secondary | ICD-10-CM | POA: Diagnosis not present

## 2022-10-24 MED ORDER — SODIUM CHLORIDE 0.9% FLUSH
10.0000 mL | Freq: Once | INTRAVENOUS | Status: AC | PRN
  Administered 2022-10-24: 10 mL

## 2022-10-24 MED ORDER — HEPARIN SOD (PORK) LOCK FLUSH 100 UNIT/ML IV SOLN
500.0000 [IU] | Freq: Once | INTRAVENOUS | Status: AC | PRN
  Administered 2022-10-24: 500 [IU]

## 2022-10-24 MED ORDER — ACETAMINOPHEN 325 MG PO TABS
650.0000 mg | ORAL_TABLET | Freq: Once | ORAL | Status: AC
  Administered 2022-10-24: 650 mg via ORAL
  Filled 2022-10-24: qty 2

## 2022-10-24 MED ORDER — SODIUM CHLORIDE 0.9 % IV SOLN: Freq: Once | INTRAVENOUS | Status: AC

## 2022-10-24 MED ORDER — LORATADINE 10 MG PO TABS
10.0000 mg | ORAL_TABLET | Freq: Once | ORAL | Status: AC
  Administered 2022-10-24: 10 mg via ORAL
  Filled 2022-10-24: qty 1

## 2022-10-24 MED ORDER — SODIUM CHLORIDE 0.9 % IV SOLN
300.0000 mg | Freq: Once | INTRAVENOUS | Status: AC
  Administered 2022-10-24: 300 mg via INTRAVENOUS
  Filled 2022-10-24: qty 300

## 2022-10-24 NOTE — Patient Instructions (Signed)
Iron Sucrose Injection What is this medication? IRON SUCROSE (EYE ern SOO krose) treats low levels of iron (iron deficiency anemia) in people with kidney disease. Iron is a mineral that plays an important role in making red blood cells, which carry oxygen from your lungs to the rest of your body. This medicine may be used for other purposes; ask your health care provider or pharmacist if you have questions. COMMON BRAND NAME(S): Venofer What should I tell my care team before I take this medication? They need to know if you have any of these conditions: Anemia not caused by low iron levels Heart disease High levels of iron in the blood Kidney disease Liver disease An unusual or allergic reaction to iron, other medications, foods, dyes, or preservatives Pregnant or trying to get pregnant Breastfeeding How should I use this medication? This medication is for infusion into a vein. It is given in a hospital or clinic setting. Talk to your care team about the use of this medication in children. While this medication may be prescribed for children as young as 2 years for selected conditions, precautions do apply. Overdosage: If you think you have taken too much of this medicine contact a poison control center or emergency room at once. NOTE: This medicine is only for you. Do not share this medicine with others. What if I miss a dose? Keep appointments for follow-up doses. It is important not to miss your dose. Call your care team if you are unable to keep an appointment. What may interact with this medication? Do not take this medication with any of the following: Deferoxamine Dimercaprol Other iron products This medication may also interact with the following: Chloramphenicol Deferasirox This list may not describe all possible interactions. Give your health care provider a list of all the medicines, herbs, non-prescription drugs, or dietary supplements you use. Also tell them if you smoke,  drink alcohol, or use illegal drugs. Some items may interact with your medicine. What should I watch for while using this medication? Visit your care team regularly. Tell your care team if your symptoms do not start to get better or if they get worse. You may need blood work done while you are taking this medication. You may need to follow a special diet. Talk to your care team. Foods that contain iron include: whole grains/cereals, dried fruits, beans, or peas, leafy green vegetables, and organ meats (liver, kidney). What side effects may I notice from receiving this medication? Side effects that you should report to your care team as soon as possible: Allergic reactions--skin rash, itching, hives, swelling of the face, lips, tongue, or throat Low blood pressure--dizziness, feeling faint or lightheaded, blurry vision Shortness of breath Side effects that usually do not require medical attention (report to your care team if they continue or are bothersome): Flushing Headache Joint pain Muscle pain Nausea Pain, redness, or irritation at injection site This list may not describe all possible side effects. Call your doctor for medical advice about side effects. You may report side effects to FDA at 1-800-FDA-1088. Where should I keep my medication? This medication is given in a hospital or clinic and will not be stored at home. NOTE: This sheet is a summary. It may not cover all possible information. If you have questions about this medicine, talk to your doctor, pharmacist, or health care provider.  2023 Elsevier/Gold Standard (2020-12-29 00:00:00)

## 2022-10-24 NOTE — Progress Notes (Signed)
Pt tolerated Venofer infusion well. Vital Signs stable at discharge. Pt ambulated with walker. Refused to stay her 30 minute observation after Venofer infusion complete.

## 2022-10-25 ENCOUNTER — Ambulatory Visit: Payer: Medicare Other | Admitting: Internal Medicine

## 2022-10-29 DIAGNOSIS — E119 Type 2 diabetes mellitus without complications: Secondary | ICD-10-CM | POA: Diagnosis not present

## 2022-10-29 DIAGNOSIS — I5042 Chronic combined systolic (congestive) and diastolic (congestive) heart failure: Secondary | ICD-10-CM | POA: Diagnosis not present

## 2022-10-29 DIAGNOSIS — J449 Chronic obstructive pulmonary disease, unspecified: Secondary | ICD-10-CM | POA: Diagnosis not present

## 2022-10-29 DIAGNOSIS — I110 Hypertensive heart disease with heart failure: Secondary | ICD-10-CM | POA: Diagnosis not present

## 2022-10-31 ENCOUNTER — Inpatient Hospital Stay: Payer: Medicare Other

## 2022-10-31 VITALS — BP 110/62 | HR 102 | Temp 98.0°F | Resp 18

## 2022-10-31 DIAGNOSIS — J449 Chronic obstructive pulmonary disease, unspecified: Secondary | ICD-10-CM | POA: Diagnosis not present

## 2022-10-31 DIAGNOSIS — D509 Iron deficiency anemia, unspecified: Secondary | ICD-10-CM | POA: Diagnosis not present

## 2022-10-31 DIAGNOSIS — Z95828 Presence of other vascular implants and grafts: Secondary | ICD-10-CM

## 2022-10-31 DIAGNOSIS — E119 Type 2 diabetes mellitus without complications: Secondary | ICD-10-CM | POA: Diagnosis not present

## 2022-10-31 DIAGNOSIS — Z720 Tobacco use: Secondary | ICD-10-CM | POA: Diagnosis not present

## 2022-10-31 DIAGNOSIS — I5042 Chronic combined systolic (congestive) and diastolic (congestive) heart failure: Secondary | ICD-10-CM | POA: Diagnosis not present

## 2022-10-31 DIAGNOSIS — I110 Hypertensive heart disease with heart failure: Secondary | ICD-10-CM | POA: Diagnosis not present

## 2022-10-31 DIAGNOSIS — C3411 Malignant neoplasm of upper lobe, right bronchus or lung: Secondary | ICD-10-CM | POA: Diagnosis not present

## 2022-10-31 MED ORDER — HEPARIN SOD (PORK) LOCK FLUSH 100 UNIT/ML IV SOLN
500.0000 [IU] | Freq: Once | INTRAVENOUS | Status: AC | PRN
  Administered 2022-10-31: 500 [IU]

## 2022-10-31 MED ORDER — SODIUM CHLORIDE 0.9 % IV SOLN: Freq: Once | INTRAVENOUS | Status: AC

## 2022-10-31 MED ORDER — ACETAMINOPHEN 325 MG PO TABS
650.0000 mg | ORAL_TABLET | Freq: Once | ORAL | Status: AC
  Administered 2022-10-31: 650 mg via ORAL
  Filled 2022-10-31: qty 2

## 2022-10-31 MED ORDER — LORATADINE 10 MG PO TABS
10.0000 mg | ORAL_TABLET | Freq: Every day | ORAL | Status: DC
  Administered 2022-10-31: 10 mg via ORAL
  Filled 2022-10-31: qty 1

## 2022-10-31 MED ORDER — SODIUM CHLORIDE 0.9% FLUSH
10.0000 mL | Freq: Once | INTRAVENOUS | Status: AC | PRN
  Administered 2022-10-31: 10 mL

## 2022-10-31 MED ORDER — SODIUM CHLORIDE 0.9 % IV SOLN
300.0000 mg | Freq: Once | INTRAVENOUS | Status: AC
  Administered 2022-10-31: 300 mg via INTRAVENOUS
  Filled 2022-10-31: qty 300

## 2022-11-01 ENCOUNTER — Telehealth: Payer: Self-pay | Admitting: Family Medicine

## 2022-11-01 MED ORDER — DEXLANSOPRAZOLE 60 MG PO CPDR: 1.0000 | DELAYED_RELEASE_CAPSULE | Freq: Every day | ORAL | 2 refills | Status: DC

## 2022-11-01 MED ORDER — VITAMIN D3 50 MCG (2000 UT) PO CAPS: 2000.0000 [IU] | ORAL_CAPSULE | Freq: Every day | ORAL | 2 refills | Status: DC

## 2022-11-01 NOTE — Telephone Encounter (Signed)
Patient called and stated medicare changed her prescription drug plan and she requested a call back at [phone].

## 2022-11-01 NOTE — Telephone Encounter (Signed)
Pt called and just needs her Rxs sent to [facility], that's her new pharmacy now that her insurance has changed.

## 2022-11-07 ENCOUNTER — Other Ambulatory Visit (HOSPITAL_COMMUNITY): Payer: Self-pay | Admitting: *Deleted

## 2022-11-07 MED ORDER — DIGOXIN 125 MCG PO TABS: 62.5000 ug | ORAL_TABLET | Freq: Every day | ORAL | 3 refills | Status: DC

## 2022-11-07 MED ORDER — TORSEMIDE 20 MG PO TABS: ORAL_TABLET | ORAL | 3 refills | Status: DC

## 2022-11-07 MED ORDER — METOLAZONE 2.5 MG PO TABS: ORAL_TABLET | ORAL | 3 refills | Status: DC

## 2022-11-07 MED ORDER — CLOPIDOGREL BISULFATE 75 MG PO TABS: 75.0000 mg | ORAL_TABLET | Freq: Every day | ORAL | 3 refills | Status: DC

## 2022-11-08 ENCOUNTER — Other Ambulatory Visit: Payer: Self-pay

## 2022-11-08 ENCOUNTER — Inpatient Hospital Stay: Payer: Medicare Other

## 2022-11-08 ENCOUNTER — Inpatient Hospital Stay: Payer: Medicare Other | Attending: Internal Medicine

## 2022-11-08 DIAGNOSIS — Z452 Encounter for adjustment and management of vascular access device: Secondary | ICD-10-CM | POA: Diagnosis not present

## 2022-11-08 DIAGNOSIS — C3411 Malignant neoplasm of upper lobe, right bronchus or lung: Secondary | ICD-10-CM | POA: Diagnosis not present

## 2022-11-08 DIAGNOSIS — Z95828 Presence of other vascular implants and grafts: Secondary | ICD-10-CM

## 2022-11-08 MED ORDER — SODIUM CHLORIDE 0.9% FLUSH
10.0000 mL | INTRAVENOUS | Status: DC | PRN
  Administered 2022-11-08: 10 mL via INTRAVENOUS

## 2022-11-08 MED ORDER — SODIUM CHLORIDE 0.9% FLUSH: 10.0000 mL | INTRAVENOUS | Status: DC | PRN

## 2022-11-08 MED ORDER — HEPARIN SOD (PORK) LOCK FLUSH 100 UNIT/ML IV SOLN
500.0000 [IU] | Freq: Once | INTRAVENOUS | Status: AC
  Administered 2022-11-08: 500 [IU] via INTRAVENOUS

## 2022-11-16 ENCOUNTER — Other Ambulatory Visit: Payer: Medicare Other

## 2022-11-20 NOTE — Progress Notes (Signed)
Remote ICD transmission.   

## 2022-11-29 DIAGNOSIS — I110 Hypertensive heart disease with heart failure: Secondary | ICD-10-CM | POA: Diagnosis not present

## 2022-11-29 DIAGNOSIS — E119 Type 2 diabetes mellitus without complications: Secondary | ICD-10-CM | POA: Diagnosis not present

## 2022-11-29 DIAGNOSIS — I5042 Chronic combined systolic (congestive) and diastolic (congestive) heart failure: Secondary | ICD-10-CM | POA: Diagnosis not present

## 2022-11-29 DIAGNOSIS — J449 Chronic obstructive pulmonary disease, unspecified: Secondary | ICD-10-CM | POA: Diagnosis not present

## 2022-12-06 ENCOUNTER — Inpatient Hospital Stay: Payer: Medicare Other | Attending: Internal Medicine

## 2022-12-06 DIAGNOSIS — C3411 Malignant neoplasm of upper lobe, right bronchus or lung: Secondary | ICD-10-CM | POA: Diagnosis not present

## 2022-12-06 DIAGNOSIS — Z95828 Presence of other vascular implants and grafts: Secondary | ICD-10-CM

## 2022-12-06 DIAGNOSIS — D509 Iron deficiency anemia, unspecified: Secondary | ICD-10-CM

## 2022-12-06 LAB — CBC WITH DIFFERENTIAL (CANCER CENTER ONLY)
Abs Immature Granulocytes: 0.01 10*3/uL (ref 0.00–0.07)
Basophils Absolute: 0 10*3/uL (ref 0.0–0.1)
Basophils Relative: 1 %
Eosinophils Absolute: 0.1 10*3/uL (ref 0.0–0.5)
Eosinophils Relative: 2 %
HCT: 42.7 % (ref 36.0–46.0)
Hemoglobin: 13.9 g/dL (ref 12.0–15.0)
Immature Granulocytes: 0 %
Lymphocytes Relative: 19 %
Lymphs Abs: 1.1 10*3/uL (ref 0.7–4.0)
MCH: 27.3 pg (ref 26.0–34.0)
MCHC: 32.6 g/dL (ref 30.0–36.0)
MCV: 83.9 fL (ref 80.0–100.0)
Monocytes Absolute: 0.6 10*3/uL (ref 0.1–1.0)
Monocytes Relative: 10 %
Neutro Abs: 4 10*3/uL (ref 1.7–7.7)
Neutrophils Relative %: 68 %
Platelet Count: 188 10*3/uL (ref 150–400)
RBC: 5.09 MIL/uL (ref 3.87–5.11)
RDW: 21.9 % — ABNORMAL HIGH (ref 11.5–15.5)
WBC Count: 5.8 10*3/uL (ref 4.0–10.5)
nRBC: 0 % (ref 0.0–0.2)

## 2022-12-06 LAB — IRON AND IRON BINDING CAPACITY (CC-WL,HP ONLY)
Iron: 90 ug/dL (ref 28–170)
Saturation Ratios: 15 % (ref 10.4–31.8)
TIBC: 587 ug/dL — ABNORMAL HIGH (ref 250–450)
UIBC: 497 ug/dL — ABNORMAL HIGH (ref 148–442)

## 2022-12-06 MED ORDER — SODIUM CHLORIDE 0.9% FLUSH
10.0000 mL | INTRAVENOUS | Status: DC | PRN
  Administered 2022-12-06: 10 mL via INTRAVENOUS

## 2022-12-06 MED ORDER — HEPARIN SOD (PORK) LOCK FLUSH 100 UNIT/ML IV SOLN
500.0000 [IU] | Freq: Once | INTRAVENOUS | Status: AC
  Administered 2022-12-06: 500 [IU] via INTRAVENOUS

## 2022-12-06 NOTE — Patient Instructions (Signed)

## 2022-12-07 LAB — FERRITIN: Ferritin: 19 ng/mL (ref 11–307)

## 2022-12-13 ENCOUNTER — Other Ambulatory Visit (HOSPITAL_COMMUNITY): Payer: Self-pay | Admitting: Family Medicine

## 2022-12-13 DIAGNOSIS — Z720 Tobacco use: Secondary | ICD-10-CM

## 2022-12-17 ENCOUNTER — Other Ambulatory Visit (HOSPITAL_COMMUNITY): Payer: Self-pay | Admitting: Cardiology

## 2022-12-17 ENCOUNTER — Telehealth: Payer: Self-pay | Admitting: Family Medicine

## 2022-12-17 MED ORDER — POTASSIUM CHLORIDE CRYS ER 10 MEQ PO TBCR: 10.0000 meq | EXTENDED_RELEASE_TABLET | Freq: Two times a day (BID) | ORAL | 3 refills | Status: DC

## 2022-12-17 NOTE — Telephone Encounter (Signed)
In the past omeprazole did not work well  Would ordinarily try protonix -has she tried this in the past  Let me know  thanks

## 2022-12-17 NOTE — Telephone Encounter (Signed)
Pt called requesting a call back stated insurance will not cover Rx dexlansoprazole (DEXILANT) 60 MG capsule  . Was given a 30 day supply but would need an alternative going forward . [phone]

## 2022-12-18 MED ORDER — PANTOPRAZOLE SODIUM 40 MG PO TBEC: 40.0000 mg | DELAYED_RELEASE_TABLET | Freq: Every day | ORAL | 3 refills | Status: DC

## 2022-12-18 NOTE — Telephone Encounter (Signed)
Pt hasn't tried protonix before and she would like to try it now. Pt is requesting a 90 day Rx sent to [facility] because it will be cheaper to use her mail order pharmacy. Pt advise if she has any side eff or the med isn't effective to let us know. Pt just got a 30 day Rx of the dexilant so she is okay waiting for the mail order to deliver the protonix

## 2022-12-18 NOTE — Telephone Encounter (Signed)
I sent it  Let me know if any problems getting it or if change in symptoms

## 2022-12-28 ENCOUNTER — Other Ambulatory Visit: Payer: Medicare Other

## 2022-12-31 ENCOUNTER — Encounter: Payer: Self-pay | Admitting: *Deleted

## 2022-12-31 ENCOUNTER — Telehealth: Payer: Self-pay | Admitting: *Deleted

## 2022-12-31 NOTE — Patient Outreach (Signed)
  Care Coordination   Initial Visit Note   12/31/2022 Name: VASHANTI RANDLETT MRN: [id number] DOB: 1955/10/11  CHASIE FINEFROCK is a 67 y.o. year old female who sees Tower, [name], MD for primary care. I spoke with  Velia Meyer by phone today.  What matters to the patients health and wellness today?  Enjoy life, minimize health complications, work on getting DM under better control.    Goals Addressed   None     SDOH assessments and interventions completed:  Yes  SDOH Interventions Today    Flowsheet Row Most Recent Value  SDOH Interventions   Food Insecurity Interventions Intervention Not Indicated  Housing Interventions Intervention Not Indicated  Utilities Interventions Intervention Not Indicated  Alcohol Usage Interventions Intervention Not Indicated (Score <7)  Financial Strain Interventions Intervention Not Indicated  Physical Activity Interventions Intervention Not Indicated  Stress Interventions Intervention Not Indicated  Social Connections Interventions Intervention Not Indicated  [PT HAS BEEN UNDERGOING [place] TX'S AND MINIMIZES PUBLIC ACTIVITIES.]        Care Coordination Interventions:  Yes, provided   Follow up plan:  Home visit scheduled for 01/03/23 at 3:30 pm.    Encounter Outcome:  Pt. Visit Completed   Kayleen Memos C. Myrtie Neither, MSN, [facility] Gerontological Nurse Practitioner [facility] Care Management [phone]

## 2023-01-03 ENCOUNTER — Ambulatory Visit: Payer: Self-pay | Admitting: *Deleted

## 2023-01-04 ENCOUNTER — Encounter: Payer: Self-pay | Admitting: *Deleted

## 2023-01-04 NOTE — Patient Outreach (Signed)
  Care Coordination   Home Visit Note Health Equity Plan Participant   01/04/2023 Name: Diane [name] MRN: [id number] DOB: 23-Jun-1956  Diane [name] is a 67 y.o. year old female who sees Tower, Audrie Gallus, MD for primary care. I visited  Diane [name] in their home today.  What matters to the patients health and wellness today?  Live every day!   FBS today was 114   Goals Addressed             This Visit's Progress    Manage my DM and try to get HGBA1C < 7.0       Interventions Today    Flowsheet Row Most Recent Value  Chronic Disease   Chronic disease during today's visit Diabetes, Other  [Lung Ca]  General Interventions   General Interventions Discussed/Reviewed General Interventions Discussed, General Interventions Reviewed  Exercise Interventions   Exercise Discussed/Reviewed Physical Activity  Physical Activity Discussed/Reviewed Physical Activity Reviewed  [Not able to exercise due to her lung ca.]  Education Interventions   Education Provided Provided Education  Provided Verbal Education On Nutrition, Foot Care, Eye Care, Labs, Blood Sugar Monitoring, Mental Health/Coping with Illness, Exercise, Medication, When to see the doctor, Sick Day Rules, [employer] Reviewed Hgb A1c, Kidney Function, Lipid Profile  Mental Health Interventions   Mental Health Discussed/Reviewed Mental Health Reviewed, Coping Strategies  Nutrition Interventions   Nutrition Discussed/Reviewed Carbohydrate meal planning  Pharmacy Interventions   Pharmacy Dicussed/Reviewed Medication Adherence  Safety Interventions   Safety Discussed/Reviewed Safety Discussed, Safety Reviewed, Fall Risk, Home Safety  Advanced Directive Interventions   Advanced Directives Discussed/Reviewed Advanced Directives Discussed  [States she had HCPOA-sister but not in chart. Requested.]              SDOH assessments and interventions completed:  Yes    Previously addressed. Care Coordination  Interventions:  Yes, provided   Follow up plan:  New Care Manager to schedule in May.    Encounter Outcome:  Pt. Visit Completed   Noralyn Pick C. Burgess Estelle, MSN, [facility] Gerontological Nurse Practitioner [facility] Care Management [phone]

## 2023-01-07 ENCOUNTER — Ambulatory Visit (HOSPITAL_COMMUNITY)
Admission: RE | Admit: 2023-01-07 | Discharge: 2023-01-07 | Disposition: A | Discharge status: Home / Self Care | Payer: Medicare Other | Source: Ambulatory Visit | Attending: Physician Assistant | Admitting: Physician Assistant

## 2023-01-07 ENCOUNTER — Inpatient Hospital Stay: Payer: Medicare Other | Attending: Internal Medicine

## 2023-01-07 ENCOUNTER — Ambulatory Visit: Payer: Medicare Other

## 2023-01-07 ENCOUNTER — Telehealth: Payer: Self-pay

## 2023-01-07 ENCOUNTER — Other Ambulatory Visit: Payer: Self-pay

## 2023-01-07 DIAGNOSIS — C3490 Malignant neoplasm of unspecified part of unspecified bronchus or lung: Secondary | ICD-10-CM | POA: Insufficient documentation

## 2023-01-07 DIAGNOSIS — J432 Centrilobular emphysema: Secondary | ICD-10-CM | POA: Diagnosis not present

## 2023-01-07 DIAGNOSIS — Z95828 Presence of other vascular implants and grafts: Secondary | ICD-10-CM

## 2023-01-07 DIAGNOSIS — C3401 Malignant neoplasm of right main bronchus: Secondary | ICD-10-CM | POA: Insufficient documentation

## 2023-01-07 DIAGNOSIS — D509 Iron deficiency anemia, unspecified: Secondary | ICD-10-CM | POA: Insufficient documentation

## 2023-01-07 LAB — CMP (CANCER CENTER ONLY)
ALT: 15 U/L (ref 0–44)
AST: 16 U/L (ref 15–41)
Albumin: 4.3 g/dL (ref 3.5–5.0)
Alkaline Phosphatase: 56 U/L (ref 38–126)
Anion gap: 5 (ref 5–15)
BUN: 22 mg/dL (ref 8–23)
CO2: 34 mmol/L — ABNORMAL HIGH (ref 22–32)
Calcium: 10 mg/dL (ref 8.9–10.3)
Chloride: 92 mmol/L — ABNORMAL LOW (ref 98–111)
Creatinine: 0.89 mg/dL (ref 0.44–1.00)
GFR, Estimated: >60 mL/min (ref >60)
Glucose, Bld: 159 mg/dL — ABNORMAL HIGH (ref 70–99)
Potassium: 3.8 mmol/L (ref 3.5–5.1)
Sodium: 131 mmol/L — ABNORMAL LOW (ref 135–145)
Total Bilirubin: 0.4 mg/dL (ref 0.3–1.2)
Total Protein: 7.3 g/dL (ref 6.5–8.1)

## 2023-01-07 LAB — CBC WITH DIFFERENTIAL (CANCER CENTER ONLY)
Abs Immature Granulocytes: 0.02 10*3/uL (ref 0.00–0.07)
Basophils Absolute: 0 10*3/uL (ref 0.0–0.1)
Basophils Relative: 1 %
Eosinophils Absolute: 0.1 10*3/uL (ref 0.0–0.5)
Eosinophils Relative: 2 %
HCT: 42.7 % (ref 36.0–46.0)
Hemoglobin: 14.4 g/dL (ref 12.0–15.0)
Immature Granulocytes: 0 %
Lymphocytes Relative: 15 %
Lymphs Abs: 0.9 10*3/uL (ref 0.7–4.0)
MCH: 28.5 pg (ref 26.0–34.0)
MCHC: 33.7 g/dL (ref 30.0–36.0)
MCV: 84.4 fL (ref 80.0–100.0)
Monocytes Absolute: 0.6 10*3/uL (ref 0.1–1.0)
Monocytes Relative: 10 %
Neutro Abs: 4.4 10*3/uL (ref 1.7–7.7)
Neutrophils Relative %: 72 %
Platelet Count: 200 10*3/uL (ref 150–400)
RBC: 5.06 MIL/uL (ref 3.87–5.11)
RDW: 17.6 % — ABNORMAL HIGH (ref 11.5–15.5)
WBC Count: 6 10*3/uL (ref 4.0–10.5)
nRBC: 0 % (ref 0.0–0.2)

## 2023-01-07 LAB — FERRITIN: Ferritin: 11 ng/mL (ref 11–307)

## 2023-01-07 LAB — IRON AND IRON BINDING CAPACITY (CC-WL,HP ONLY)
Iron: 118 ug/dL (ref 28–170)
Saturation Ratios: 20 % (ref 10.4–31.8)
TIBC: 588 ug/dL — ABNORMAL HIGH (ref 250–450)
UIBC: 470 ug/dL — ABNORMAL HIGH (ref 148–442)

## 2023-01-07 LAB — SAMPLE TO BLOOD BANK

## 2023-01-07 MED ORDER — SODIUM CHLORIDE (PF) 0.9 % IJ SOLN
INTRAMUSCULAR | Status: AC
  Filled 2023-01-07: qty 50

## 2023-01-07 MED ORDER — HEPARIN SOD (PORK) LOCK FLUSH 100 UNIT/ML IV SOLN
500.0000 [IU] | Freq: Once | INTRAVENOUS | Status: AC
  Administered 2023-01-07: 500 [IU] via INTRAVENOUS

## 2023-01-07 MED ORDER — IOHEXOL 300 MG/ML  SOLN
75.0000 mL | Freq: Once | INTRAMUSCULAR | Status: AC | PRN
  Administered 2023-01-07: 75 mL via INTRAVENOUS

## 2023-01-07 MED ORDER — SODIUM CHLORIDE 0.9% FLUSH
10.0000 mL | Freq: Once | INTRAVENOUS | Status: AC
  Administered 2023-01-07: 10 mL

## 2023-01-07 MED ORDER — HEPARIN SOD (PORK) LOCK FLUSH 100 UNIT/ML IV SOLN
INTRAVENOUS | Status: AC
  Filled 2023-01-07: qty 5

## 2023-01-07 NOTE — Telephone Encounter (Signed)
CRITICAL VALUE STICKER  CRITICAL VALUE: HGB 14.4  RECEIVER (on-site recipient of call): Thia Olesen P. LPN  DATE & TIME NOTIFIED: 01/07/23 1:39pm  MESSENGER (representative from lab): Lauren  MD NOTIFIED: Salena Saner Heilingoetter, PA-C

## 2023-01-09 ENCOUNTER — Inpatient Hospital Stay (HOSPITAL_BASED_OUTPATIENT_CLINIC_OR_DEPARTMENT_OTHER): Payer: Medicare Other | Admitting: Internal Medicine

## 2023-01-09 VITALS — BP 103/66 | HR 100 | Temp 97.6°F | Resp 14 | Wt 117.0 lb

## 2023-01-09 DIAGNOSIS — C3490 Malignant neoplasm of unspecified part of unspecified bronchus or lung: Secondary | ICD-10-CM | POA: Diagnosis not present

## 2023-01-09 DIAGNOSIS — C3401 Malignant neoplasm of right main bronchus: Secondary | ICD-10-CM | POA: Diagnosis not present

## 2023-01-09 DIAGNOSIS — D509 Iron deficiency anemia, unspecified: Secondary | ICD-10-CM | POA: Diagnosis not present

## 2023-01-09 NOTE — Progress Notes (Signed)
[facility] [phone]   [phone]  OFFICE PROGRESS NOTE  Tower, Diane GallusMarne A, MD [address] [place] [id number]  DIAGNOSIS:  1) Limited stage (T3, N2, M0) small cell lung cancer presented with right hilar and mediastinal lymphadenopathy. There was no hypermetabolism in the left adrenal gland lesion. Diagnosed in December 2022. 2)  Iron Deficiency Anemia   PRIOR THERAPY:  1) Systemic chemotherapy with systemic chemotherapy with carboplatin for an AUC of 5, etoposide 100 mg per metered squared, and Imfinzi 1500 mg IV every 3 weeks with Cosela for myeloprotection.  First dose on 10/16/2021.  Discontinued after 1 cycle as the patient staging PET scan showed limited stage disease. 2) systemic chemotherapy with carboplatin for an AUC of 5 on day 1 and etoposide 100 mg/m2 on days 1, 2, and 3 IV every 3 weeks.  First dose on 11/06/2021.  This will be concurrent with radiotherapy under the care of Dr. Roselind MessierKinard.  Status post 3 cycles. 3) Venofer infusions as needed. Last dose on 02/07/21    CURRENT THERAPY: Observation   INTERVAL HISTORY: Diane CrankerDonna M [name] 67 y.o. female returns to the clinic today for follow-up visit.  The patient is feeling fine today with no concerning complaints.  She denied having any current chest pain, shortness of breath, cough or hemoptysis.  She has no nausea, vomiting, diarrhea or constipation.  She has no headache or visual changes.  She denied having any weight loss or night sweats.  She has no headache or visual changes.  She is here today for evaluation with repeat CT scan of the chest for restaging of her disease.  MEDICAL HISTORY: Past Medical History:  Diagnosis Date   Anemia    Arthritis    Carotid stenosis    [name]. s/p right carotid stent 10/2017.   Chronic systolic CHF (congestive heart failure)    Colon polyps    COPD (chronic obstructive pulmonary disease)    Coronary artery disease    post CABG in 3/07 , coronary  stents    Depression    after major stroke years ago, no longer treated   Diabetes mellitus    type 2   Dyslipidemia    Fatty liver    Fibromyalgia    GERD (gastroesophageal reflux disease)    Headache    hx of    History of kidney stones    History of radiation therapy    right lung 11/15/2021-12/26/2021  Dr Antony BlackbirdJames Kinard   Hyperlipidemia    Hypertension    Lung cancer    Myocardial infarction    Pneumonia    hx of    Seizures    last seizure- 03/2013    Shortness of breath dyspnea    with exertion or when fluid builds up    Sleep apnea    used to wear [name] cpap- not used in 3 years    Status post dilation of esophageal narrowing    Stroke 1993   problems with balance    Systolic murmur    known mild AS and MR   Thyroid goiter    Tobacco abuse     ALLERGIES:  is allergic to bee venom, benadryl [diphenhydramine], entresto [sacubitril-valsartan], farxiga [dapagliflozin], tetracycline, canagliflozin, morphine sulfate er, sitagliptin, and ace inhibitors.  MEDICATIONS:  Current Outpatient Medications  Medication Sig Dispense Refill   acetaminophen (TYLENOL) 500 MG tablet Take 1,000 mg by mouth at bedtime.     albuterol (  PROVENTIL) (2.5 MG/3ML) 0.083% nebulizer solution Take 3 mLs (2.5 mg total) by nebulization every 6 (six) hours as needed for wheezing or shortness of breath. 1180 mL 3   albuterol (VENTOLIN HFA) 108 (90 Base) MCG/ACT inhaler INHALE 2 PUFFS EVERY 6 HOURS AS NEEDED FOR WHEEZING/SHORTNESS OF BREATH 18 each 0   aspirin EC 81 MG tablet Take 81 mg by mouth every evening.  30 tablet 6   BD PEN NEEDLE NANO 2ND GEN 32G X 4 MM MISC Inject into the skin 3 (three) times daily.     Cholecalciferol (VITAMIN D3) 50 MCG (2000 UT) capsule Take 1 capsule (2,000 Units total) by mouth daily. 90 capsule 2   clopidogrel (PLAVIX) 75 MG tablet Take 1 tablet (75 mg total) by mouth daily. 90 tablet 3   digoxin (LANOXIN) 0.125 MG tablet Take 0.5 tablets (62.5 mcg total) by mouth daily. 45  tablet 3   diphenhydrAMINE (BENADRYL) 25 mg capsule Take 25 mg by mouth 2 (two) times daily as needed for itching or allergies.     ezetimibe (ZETIA) 10 MG tablet Take 1 tablet (10 mg total) by mouth daily. PLEASE CALL OFFICE FOR FOLLOW UP APPOINTMENT 90 tablet 3   ferrous sulfate 324 MG TBEC Take 1 tablet (324 mg total) by mouth daily with breakfast. 30 tablet 2   fluticasone (FLONASE) 50 MCG/ACT nasal spray Place 2 sprays into both nostrils daily. 48 g 3   gabapentin (NEURONTIN) 300 MG capsule TAKE 2 CAPSULES DAILY AT   BEDTIME 180 capsule 1   glucose blood (ONETOUCH ULTRA) test strip 3 (three) times daily.     guaiFENesin (MUCINEX) 600 MG 12 hr tablet Take 2 tablets (1,200 mg total) by mouth 2 (two) times daily. 360 tablet 3   HYDROcodone-acetaminophen (NORCO) 5-325 MG tablet Take 1 tablet by mouth every 6 (six) hours as needed for moderate pain. 30 tablet 0   insulin aspart protamine- aspart (NOVOLOG MIX 70/30) (70-30) 100 UNIT/ML injection Inject 5-65 Units into the skin See admin instructions. 65 units in the Am with breakfast and 5 units with lunch and 55 units with dinner     levETIRAcetam (KEPPRA) 1000 MG tablet TAKE 1 TABLET TWICE [name] DAY 180 tablet 0   lidocaine-prilocaine (EMLA) cream Apply to the Port-[name]-Cath site 30-60 minutes before chemotherapy 30 g 0   metFORMIN (GLUCOPHAGE) 500 MG tablet Take 500 mg by mouth 2 (two) times daily with [name] meal.     metolazone (ZAROXOLYN) 2.5 MG tablet TAKE 1 TABLET TWICE WEEKLY,EVERY MONDAY AND FRIDAY    (CHANGE IN DOSAGE). 26 tablet 3   nitroGLYCERIN (NITROLINGUAL) 0.4 MG/SPRAY spray Place 1 spray under the tongue every 5 (five) minutes x 3 doses as needed for chest pain. 12 g 3   nystatin (MYCOSTATIN/NYSTOP) powder APPLY 1 APPLICATION        TOPICALLY 4 TIMES [name] DAY AS NEEDED FOR YEAST INFECTIONS 60 g 5   ONE TOUCH ULTRA TEST test strip USE AS DIRECTED FOR TESTING BLOOD GLUCOSE 3 TIMES DAILY  1   pantoprazole (PROTONIX) 40 MG tablet Take 1 tablet (40 mg  total) by mouth daily. 90 tablet 3   polyethylene glycol (MIRALAX / GLYCOLAX) 17 g packet Take 17 g by mouth daily.     potassium chloride (KLOR-CON M) 10 MEQ tablet Take 1 tablet (10 mEq total) by mouth 2 (two) times daily. 180 tablet 3   promethazine (PHENERGAN) 12.5 MG tablet Take 1 tablet (12.5 mg total) by mouth every 6 (six)  hours as needed for nausea or vomiting. 30 tablet 2   ranolazine (RANEXA) 1000 MG SR tablet TAKE 1 TABLET TWICE [name] DAY 180 tablet 3   rosuvastatin (CRESTOR) 20 MG tablet Take 1 tablet (20 mg total) by mouth daily. PLEASE CALL OFFICE FOR FOLLOW UP APPOINTMENT 90 tablet 3   sodium chloride (OCEAN) 0.65 % SOLN nasal spray Place 1 spray into both nostrils as needed for congestion.     spironolactone (ALDACTONE) 25 MG tablet Take 1 tablet (25 mg total) by mouth at bedtime. PLEASE CALL OFFICE FOR FOLLOW UP APPOINTMENT 90 tablet 3   torsemide (DEMADEX) 20 MG tablet TAKE 3 TABLETS (60MG  TOTAL)TWO TIMES [name] DAY 540 tablet 3   triamcinolone cream (KENALOG) 0.5 % Apply 1 application topically 3 (three) times daily as needed (rash). To affected areas     VASCEPA 1 g capsule TAKE 2 CAPSULES (2GRAMS    TOTAL) TWO TIMES [name] DAY 120 capsule 11   VERQUVO 10 MG TABS TAKE 1 TABLET ONCE DAILY 90 tablet 3   No current facility-administered medications for this visit.    SURGICAL HISTORY:  Past Surgical History:  Procedure Laterality Date   ABDOMINAL AORTOGRAM W/LOWER EXTREMITY N/[name] 04/18/2020   Procedure: ABDOMINAL AORTOGRAM W/LOWER EXTREMITY;  Surgeon: Runell Gess, MD;  Location: [facility]VASIVE CV [facility];  Service: Cardiovascular;  Laterality: N/[name];   APPENDECTOMY     BIOPSY  02/03/2019   Procedure: BIOPSY;  Surgeon: Meryl Dare, MD;  Location: [facility] ENDOSCOPY;  Service: Endoscopy;;   BREAST BIOPSY Left 2016   BRONCHIAL BIOPSY  09/18/2021   Procedure: BRONCHIAL BIOPSIES;  Surgeon: Leslye Peer, MD;  Location: [facility] ENDOSCOPY;  Service: Pulmonary;;   BRONCHIAL BRUSHINGS  09/18/2021    Procedure: BRONCHIAL BRUSHINGS;  Surgeon: Leslye Peer, MD;  [facility];  Service: Pulmonary;;   BRONCHIAL NEEDLE ASPIRATION BIOPSY  09/18/2021   Procedure: BRONCHIAL NEEDLE ASPIRATION BIOPSIES;  Surgeon: Leslye Peer, MD;  Location: [facility] ENDOSCOPY;  Service: Pulmonary;;   CARDIAC CATHETERIZATION  11/29/05   EF of 55%   CARDIAC CATHETERIZATION  08/06/06   EF of 45-50%   CARDIAC CATHETERIZATION N/[name] 05/06/2015   Procedure: Right/Left Heart Cath and Coronary/Graft Angiography;  Surgeon: Peter M Swaziland, MD;  Location: [facility];  Service: Cardiovascular;  Laterality: N/[name];   CAROTID PTA/STENT INTERVENTION N/[name] 10/10/2017   Procedure: CAROTID PTA/STENT INTERVENTION - Right;  Surgeon: Nada Libman, MD;  Location: [facility];  Service: Cardiovascular;  Laterality: N/[name];   CERVICAL FUSION  1990   CHOLECYSTECTOMY     COLON RESECTION     mass removed and 4 in of colon   COLONOSCOPY WITH PROPOFOL N/[name] 07/16/2017   Procedure: COLONOSCOPY WITH PROPOFOL;  Surgeon: Meryl Dare, MD;  Location: [facility] ENDOSCOPY;  Service: Endoscopy;  Laterality: N/[name];   CORONARY ARTERY BYPASS GRAFT  12/04/2005   x5 -- left internal mammary artery to the LAD, left radial artery to the ramus intermedius, saphenous vein graft to the obtuse marginal 1, sequential saphenous vein grat to the acute marginal and posterior descending, endoscopic vein harvesting from the left thigh with open vein harvest from right leg   CORONARY STENT PLACEMENT  08/11/06   PCI of her ciurcumflex/OM vessel   ESOPHAGOGASTRODUODENOSCOPY (EGD) WITH PROPOFOL N/[name] 02/03/2019   Procedure: ESOPHAGOGASTRODUODENOSCOPY (EGD) WITH PROPOFOL;  Surgeon: Meryl Dare, MD;  Location: [facility] ENDOSCOPY;  Service: Endoscopy;  Laterality: N/[name];   FINE NEEDLE ASPIRATION  09/18/2021   Procedure:  FINE NEEDLE ASPIRATION (FNA) LINEAR;  Surgeon: Leslye Peer, MD;  Location[facility] ENDOSCOPY;  Service: Pulmonary;;   ICD IMPLANT N/[name] 07/15/2020   Procedure:  ICD IMPLANT;  Surgeon: Lanier Prude, MD;  Location: [facility] INVASIVE CV LAB;  Service: Cardiovascular;  Laterality: N/[name];   IR IMAGING GUIDED PORT INSERTION  10/20/2021   LAPAROTOMY Bilateral 05/19/2015   Procedure: EXPLORATORY LAPAROTOMY WITH BILATERAL SALPINGO OOPHORECTOMY /OMENTECTOMY/SEGMENTAL SIGMOID COLECTOMY ;  Surgeon: Adolphus Birchwood, MD;  Location: [facility] ORS;  Service: Gynecology;  Laterality: Bilateral;   PERIPHERAL VASCULAR BALLOON ANGIOPLASTY  04/18/2020   Procedure: PERIPHERAL VASCULAR BALLOON ANGIOPLASTY;  Surgeon: Runell Gess, MD;  Location: [facility];  Service: Cardiovascular;;  rt SFA  atherectomy and DCB   PERIPHERAL VASCULAR CATHETERIZATION N/[name] 11/15/2015   Procedure: Carotid PTA/Stent Intervention;  Surgeon: Runell Gess, MD;  Location: [facility];  Service: Cardiovascular;  Laterality: N/[name];   PERIPHERAL VASCULAR CATHETERIZATION  11/15/2015   Procedure: Carotid Angiography;  Surgeon: Runell Gess, MD;  Location: [facility] INVASIVE CV LAB;  Service: Cardiovascular;;   RIGHT HEART CATH N/[name] 01/27/2020   Procedure: RIGHT HEART CATH;  Surgeon: Laurey Morale, MD;  Location: [facility] INVASIVE CV LAB;  Service: Cardiovascular;  Laterality: N/[name];   VIDEO BRONCHOSCOPY WITH ENDOBRONCHIAL ULTRASOUND N/[name] 09/18/2021   Procedure: VIDEO BRONCHOSCOPY WITH ENDOBRONCHIAL ULTRASOUND;  Surgeon: Leslye Peer, MD;  Location: [facility] ENDOSCOPY;  Service: Pulmonary;  Laterality: N/[name];   VIDEO BRONCHOSCOPY WITH RADIAL ENDOBRONCHIAL ULTRASOUND  09/18/2021   Procedure: RADIAL ENDOBRONCHIAL ULTRASOUND;  Surgeon: Leslye Peer, MD;  Location: [facility] ENDOSCOPY;  Service: Pulmonary;;    REVIEW OF SYSTEMS:  [name] comprehensive review of systems was negative.   PHYSICAL EXAMINATION: General appearance: alert, cooperative, appears stated age, and no distress Head: Normocephalic, without obvious abnormality, atraumatic Neck: no adenopathy, no JVD, supple, symmetrical, trachea midline, and thyroid not enlarged,  symmetric, no tenderness/mass/nodules Lymph nodes: Cervical, supraclavicular, and axillary nodes normal. Resp: clear to auscultation bilaterally Back: symmetric, no curvature. ROM normal. No CVA tenderness. Cardio: regular rate and rhythm, S1, S2 normal, no murmur, click, rub or gallop GI: soft, non-tender; bowel sounds normal; no masses,  no organomegaly Extremities: extremities normal, atraumatic, no cyanosis or edema  ECOG PERFORMANCE STATUS: 1 - Symptomatic but completely ambulatory  Blood pressure 103/66, pulse 100, temperature 97.6 F (36.4 C), temperature source Oral, resp. rate 14, weight 117 lb (53.1 kg), last menstrual period 09/02/2003, SpO2 93 %.  LABORATORY DATA: Lab Results  Component Value Date   WBC 6.0 01/07/2023   HGB 14.4 01/07/2023   HCT 42.7 01/07/2023   MCV 84.4 01/07/2023   PLT 200 01/07/2023      Chemistry      Component Value Date/Time   NA 131 (L) 01/07/2023 1258   NA 135 04/14/2020 1544   NA 135 (L) 02/15/2017 1457   K 3.8 01/07/2023 1258   K 4.8 02/15/2017 1457   CL 92 (L) 01/07/2023 1258   CO2 34 (H) 01/07/2023 1258   CO2 26 02/15/2017 1457   BUN 22 01/07/2023 1258   BUN 17 04/14/2020 1544   BUN 10.8 02/15/2017 1457   CREATININE 0.89 01/07/2023 1258   CREATININE 0.69 05/06/2017 1405   CREATININE 0.7 02/15/2017 1457      Component Value Date/Time   CALCIUM 10.0 01/07/2023 1258   CALCIUM 9.3 02/15/2017 1457   ALKPHOS 56 01/07/2023 1258   ALKPHOS 75 02/15/2017 1457   AST 16 01/07/2023 1258   AST  9 02/15/2017 1457   ALT 15 01/07/2023 1258   ALT 13 02/15/2017 1457   BILITOT 0.4 01/07/2023 1258   BILITOT 0.28 02/15/2017 1457       RADIOGRAPHIC STUDIES: CT Chest W Contrast  Result Date: 01/08/2023 CLINICAL DATA:  Small-cell lung cancer. Restaging. * Tracking Code: BO * EXAM: CT CHEST WITH CONTRAST TECHNIQUE: Multidetector CT imaging of the chest was performed during intravenous contrast administration. RADIATION DOSE REDUCTION: This exam  was performed according to the departmental dose-optimization program which includes automated exposure control, adjustment of the mA and/or kV according to patient size and/or use of iterative reconstruction technique. CONTRAST:  9mL OMNIPAQUE IOHEXOL 300 MG/ML  SOLN COMPARISON:  10/05/2022 FINDINGS: Cardiovascular: The heart size is normal. No substantial pericardial effusion. Coronary artery calcification is evident. Status post CABG. Moderate atherosclerotic calcification is noted in the wall of the thoracic aorta. Ascending thoracic aorta measures 4 cm diameter. Right Port-[name]-Cath tip is positioned in the distal SVC. Left-sided permanent pacemaker noted. Mediastinum/Nodes: No mediastinal lymphadenopathy. There is no hilar lymphadenopathy. The esophagus has normal imaging features. There is no axillary lymphadenopathy. Lungs/Pleura: Centrilobular emphsyema noted. Similar areas of interstitial thickening and parenchymal clustered nodularity in the parahilar right lung. 5 mm nodular component measured previously is stable on image 58/5 today. No new suspicious pulmonary nodule or mass. No dense focal airspace consolidation. No pleural effusion. Upper Abdomen: Stable thickening of both adrenal glands compatible with hyperplasia. Visualized upper abdomen otherwise unremarkable. Musculoskeletal: No worrisome lytic or sclerotic osseous abnormality. IMPRESSION: 1. Stable exam. No substantial change in the ill-defined micro nodularity of both lungs, right greater than left. 2. No new or progressive findings on today's exam. 3. 4.0 cm diameter ascending thoracic aorta. Continued close attention on follow-up studies recommended. 4.  Emphysema (ICD10-J43.9) and Aortic Atherosclerosis (ICD10-170.0) Electronically Signed   By: Kennith Center M.D.   On: 01/08/2023 12:51    ASSESSMENT AND PLAN: This is [name] very pleasant 67 years old white female recently diagnosed with Limited stage (T3, N2, M0) small cell lung cancer presented  with right hilar and mediastinal lymphadenopathy. There was no hypermetabolism in the left adrenal gland lesion. Diagnosed in December 2022. She initially started treatment with carboplatin, etoposide, Cosela and Imfinzi with the expectation that she had extensive stage disease status post 1 cycle. Staging work-up with PET scan showed no hypermetabolic activity and the suspicious left adrenal gland lesion. Her treatment was switched to chemotherapy with with carboplatin for AUC of 5 on day 1, etoposide 100 Mg/M2 on days 1, 2 and 3 every 3 weeks concurrent with radiation.  First dose today 11/06/2021.  Status post 3 cycles. She tolerated her treatment well with no concerning adverse effect except for fatigue as well as radiation-induced esophagitis with dysphagia and odynophagia. The patient declined prophylactic cranial irradiation. The patient is currently on observation and she is feeling fine with no concerning complaints. Repeat CT scan of the chest showed no concerning findings for disease recurrence or metastasis. I recommended for her to continue on observation with repeat CT scan of the chest in 3 months. For the history of iron deficiency anemia, she will continue on the oral iron tablet for now.  She has significant improvement in her hemoglobin and hematocrit after the iron infusion. She was advised to call immediately if she has any other concerning symptoms in the interval.  The patient voices understanding of current disease status and treatment options and is in agreement with the current care plan.  All  questions were answered. The patient knows to call the clinic with any problems, questions or concerns. We can certainly see the patient much sooner if necessary. The total time spent in the appointment was 20 minutes.  Disclaimer: This note was dictated with voice recognition software. Similar sounding words can inadvertently be transcribed and may not be corrected upon review.

## 2023-01-20 IMAGING — CT CT CHEST W/ CM
2 of 4 series · 13 of 36 positions shown, 16 images · non-contrast
Comparison: 10/19/2021 PET-CT.  CT chest 09/08/2021.

CLINICAL DATA: Primary Cancer Type: Lung
TECHNIQUE: Multidetector CT imaging of the chest was performed during
intravenous contrast administration.

[Series 2: axial (person_name) · axial · 0.76mm/px · z∈[-154,+120]mm · 10 of 161 slices shown, 13 images]
[im 12/161  mediastinal]
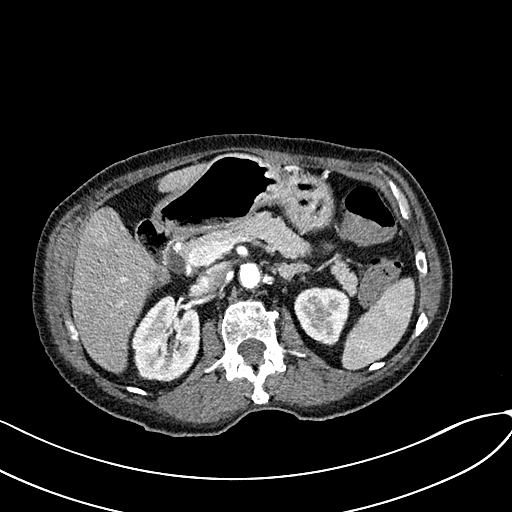
[im 12/161  lung]
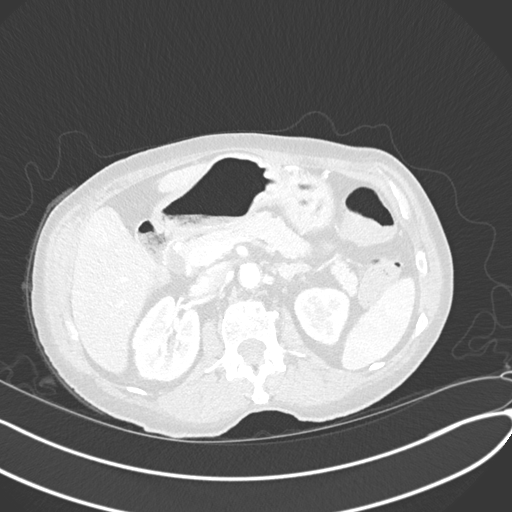
[im 23/161  lung]
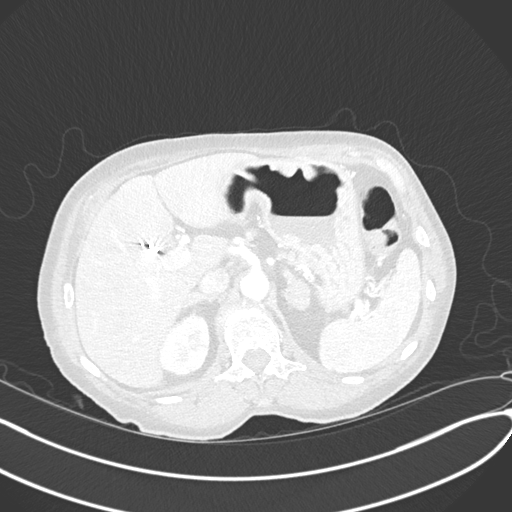
[im 46/161  lung]
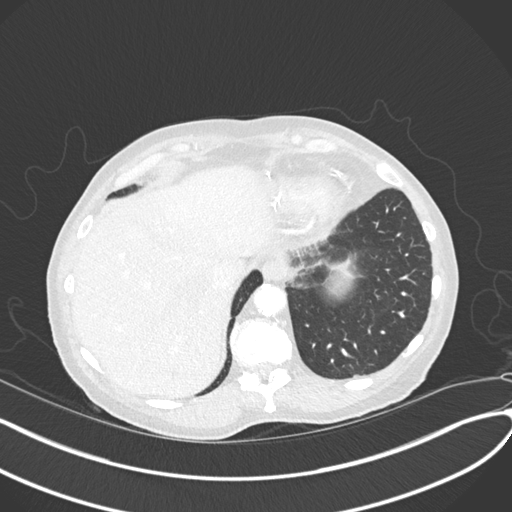
[im 58/161  lung]
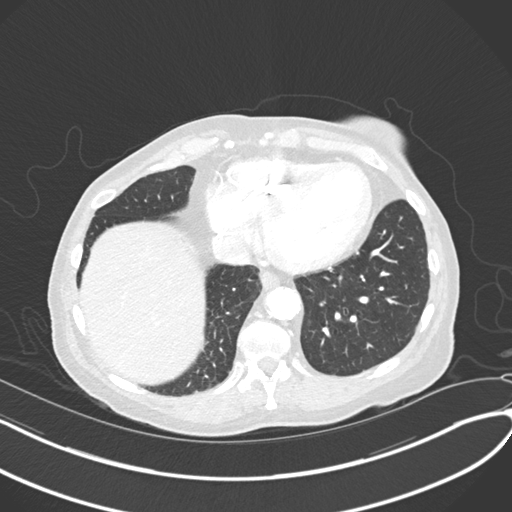
[im 69/161  mediastinal]
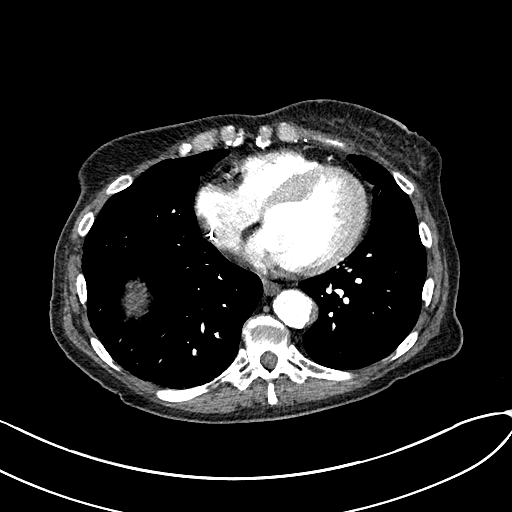
[im 69/161  lung]
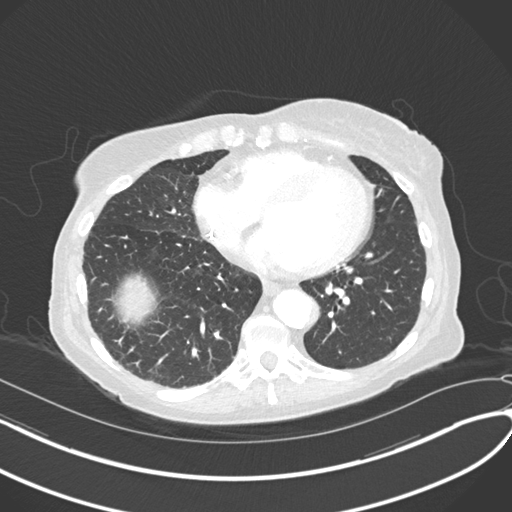
[im 92/161  lung]
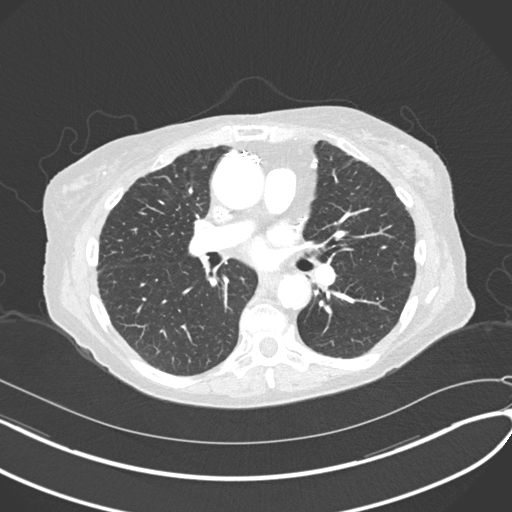
[im 103/161  lung]
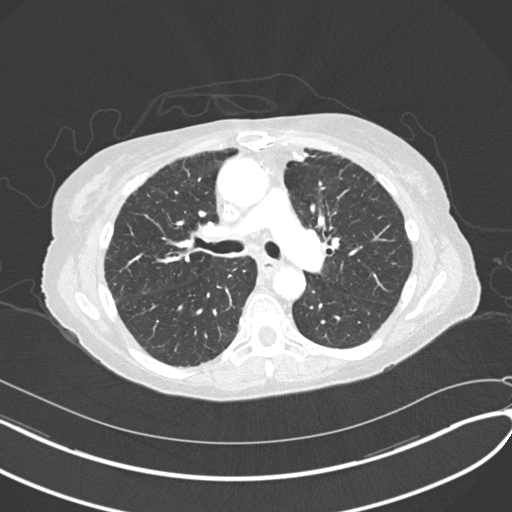
[im 115/161  lung]
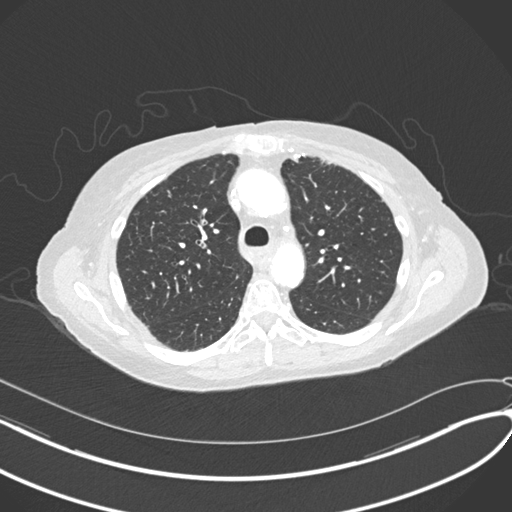
[im 138/161  mediastinal]
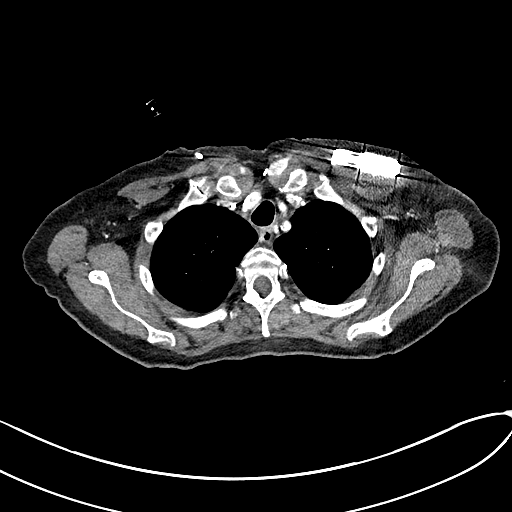
[im 138/161  lung]
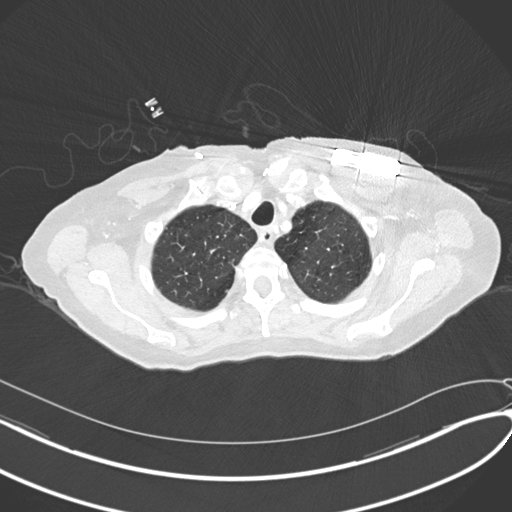
[im 149/161  lung]
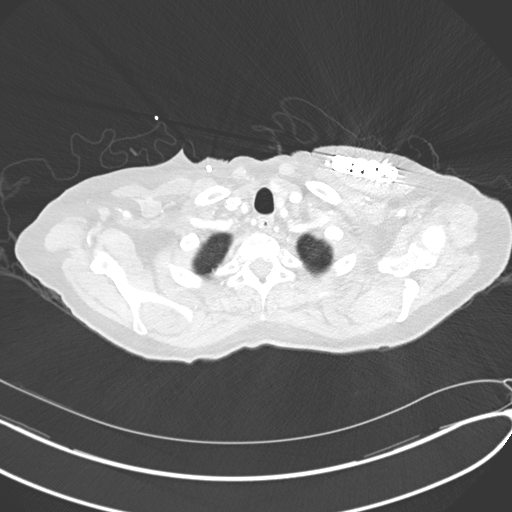

[Series 6: coronal (person_name) · coronal · 0.68mm/px · 3 of 136 slices shown]
[im 28/136  lung]
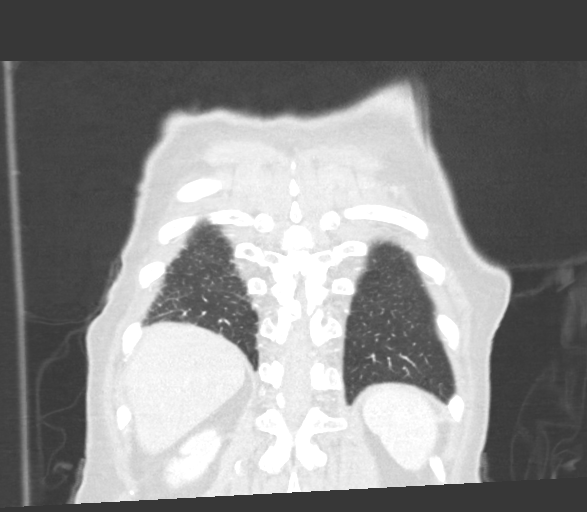
[im 55/136  lung]
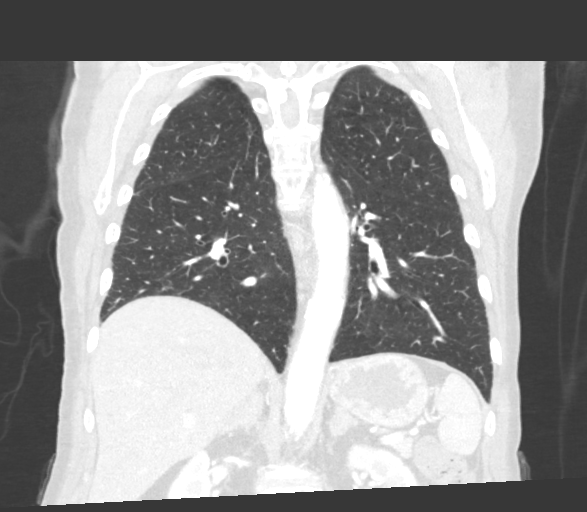
[im 82/136  lung]
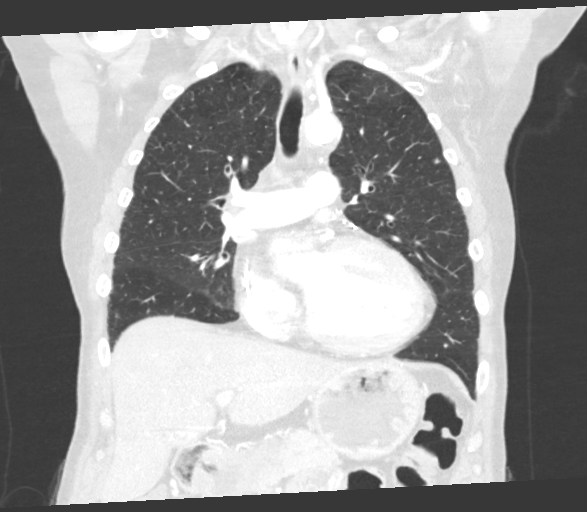

[13 of 36 positions shown; findings below may reference images not displayed]

Imaging Indication: Assess response to therapy

Interval therapy since last imaging? Yes

Initial Cancer Diagnosis

Date: 09/18/2021; Established by: Biopsy-proven

Detailed Pathology: Limited stage small cell lung cancer.

Primary Tumor location: Right upper lobe.

Surgeries: ICD 07/15/2020. CABG 5550. Cholecystectomy. Appendectomy.

Chemotherapy: Yes; Ongoing? No; Most recent administration:
12/19/2021

Immunotherapy?  Yes; Type: Imfinzi; Ongoing? No

Radiation therapy? Yes; Date Range: 11/15/2021 - 12/26/2021; Target:
Mediastinum and right hilum

EXAM:
CT CHEST WITH CONTRAST
RADIATION DOSE REDUCTION: This exam was performed according to the
departmental dose-optimization program which includes automated
exposure control, adjustment of the mA and/or kV according to
patient size and/or use of iterative reconstruction technique.

CONTRAST:  75mL OMNIPAQUE IOHEXOL 300 MG/ML  SOLN
FINDINGS: Cardiovascular: 4.2 cm caliber of the ascending thoracic aorta is
stable. Postoperative changes of median sternotomy for CABG.
Calcified atheromatous plaque of the thoracic aorta. No aneurysmal
dilation of the descending thoracic aorta. Central pulmonary
vasculature is of normal caliber. Heart size mildly enlarged without
pericardial effusion and with signs of cardiac pacer device in situ
as before. Power pack over the LEFT chest with resultant streak
artifact.

There is a bandlike area of hypoattenuation that crosses the RIGHT
lower lobe pulmonary artery, seen on multiple reconstructions with
linear appearance. No additional signs of filling defect or
abnormality in the pulmonary arteries.

RIGHT-sided Port-A-Cath in situ terminates at the caval to atrial
junction

Mediastinum/Nodes: Post sternotomy as outlined above. No adenopathy
in the chest. Diffuse mild thickening of the esophagus.

RIGHT paratracheal adenopathy has resolved. No abnormal soft tissue
in areas of the mediastinum that were formally involved. There is
mild enlargement of RIGHT hilar nodal tissue, 11 mm, not easily
measurable on the prior study but is large as 20 mm short axis.

Lungs/Pleura: RIGHT-sided pulmonary nodule is no longer visible
having resolved in the interval in the more central chest. The more
peripheral small nodule and branching nodularity in the RIGHT chest
also nearly completely resolved with a tiny area measuring 4 x 2 mm
on image 46 of series 5 is the only remaining finding in this area.
No consolidation or sign of effusion. No new pulmonary nodule.
Airways are patent.

Upper Abdomen: Stable intra and extrahepatic biliary duct dilation
following cholecystectomy. Probable background hepatic steatosis.
Imaged portions of liver, pancreas, spleen and gastrointestinal
tract otherwise unremarkable. Bilateral adrenal thickening and
nodularity compatible with LEFT adrenal adenoma and background
hyperplasia showing no change. No upper abdominal adenopathy.

Musculoskeletal: Recent fractures of RIGHT-sided ribs, fourth and
fifth ribs.
IMPRESSION: 1. Marked interval response to therapy still with some fullness of
RIGHT hilar nodal tissue and tiny nodule at the periphery of the
RIGHT upper lobe. Attention on follow-up.
2. Linear filling defect seen on coronal and sagittal images within
the RIGHT lower lobe pulmonary artery almost certainly artifact from
beam hardening related to the patient's cardiac pacer lead and
Port-A-Cath but sequela of prior/chronic pulmonary embolism could
have a similar appearance.
3. Recent fractures of RIGHT-sided ribs, fourth and fifth ribs.
4. Stable 4.2 cm caliber of the ascending thoracic aorta. Attention
on subsequent imaging. Could also consider the following. Recommend
annual imaging followup by CTA or MRA. This recommendation follows
9797 ACCF/AHA/AATS/ACR/ASA/SCA/ELGABI/RAISHA/LABELLE SALHA/ATENCIO Guidelines for the
Diagnosis and Management of Patients with Thoracic Aortic Disease.
Circulation. 9797; 121: E266-e369. Aortic aneurysm NOS (NVZW7-IU0.Q)
5. Stable intra and extrahepatic biliary duct dilation following
cholecystectomy.
6. LEFT adrenal adenoma and background adrenal hyperplasia showing
no change.
7. Probable background hepatic steatosis.

Aortic Atherosclerosis (NVZW7-XJJ.J).

## 2023-01-21 DIAGNOSIS — E1121 Type 2 diabetes mellitus with diabetic nephropathy: Secondary | ICD-10-CM | POA: Diagnosis not present

## 2023-01-21 DIAGNOSIS — E1165 Type 2 diabetes mellitus with hyperglycemia: Secondary | ICD-10-CM | POA: Diagnosis not present

## 2023-01-21 DIAGNOSIS — I509 Heart failure, unspecified: Secondary | ICD-10-CM | POA: Diagnosis not present

## 2023-01-21 DIAGNOSIS — E119 Type 2 diabetes mellitus without complications: Secondary | ICD-10-CM | POA: Diagnosis not present

## 2023-01-21 DIAGNOSIS — G629 Polyneuropathy, unspecified: Secondary | ICD-10-CM | POA: Diagnosis not present

## 2023-01-21 DIAGNOSIS — I10 Essential (primary) hypertension: Secondary | ICD-10-CM | POA: Diagnosis not present

## 2023-01-21 DIAGNOSIS — E7800 Pure hypercholesterolemia, unspecified: Secondary | ICD-10-CM | POA: Diagnosis not present

## 2023-01-21 DIAGNOSIS — E041 Nontoxic single thyroid nodule: Secondary | ICD-10-CM | POA: Diagnosis not present

## 2023-01-22 ENCOUNTER — Ambulatory Visit (INDEPENDENT_AMBULATORY_CARE_PROVIDER_SITE_OTHER): Payer: Medicare Other

## 2023-01-22 DIAGNOSIS — I255 Ischemic cardiomyopathy: Secondary | ICD-10-CM | POA: Diagnosis not present

## 2023-01-23 ENCOUNTER — Telehealth: Payer: Self-pay

## 2023-01-23 LAB — CUP PACEART REMOTE DEVICE CHECK
Battery Remaining Longevity: 144 mo
Battery Remaining Percentage: 100 %
Brady Statistic RV Percent Paced: 0 %
Date Time Interrogation Session: 20240423041100
HighPow Impedance: 87 Ohm
Implantable Lead Connection Status: 753985
Implantable Lead Implant Date: 20211015
Implantable Lead Location: 753860
Implantable Lead Model: 672
Implantable Lead Serial Number: 166771
Implantable Pulse Generator Implant Date: 20211015
Lead Channel Impedance Value: 1113 Ohm
Lead Channel Setting Pacing Amplitude: 3.5 V
Lead Channel Setting Pacing Pulse Width: 0.4 ms
Lead Channel Setting Sensing Sensitivity: 0.5 mV
Pulse Gen Serial Number: 279742
Zone Setting Status: 755011

## 2023-01-23 NOTE — Telephone Encounter (Signed)
Pt is overdue and needs in-clinic apt. Dr. Lalla Brothers is provider.

## 2023-01-23 NOTE — Telephone Encounter (Signed)
Following alert received from [employer] received for Device alert for VF with successful HV therapy. Event occurred 4/23 @ 18:01, EGM shows sustained VT/VF, rate 265, HV therapy delivered 41J converting to regular VP.   Attempted to call patient. No answer, LMTCB.

## 2023-01-24 NOTE — Telephone Encounter (Signed)
Attempted to contact Pt.  No answer.  Attempted to contact Pt's sister [name] per DPR.  Left message requesting call back.

## 2023-01-25 ENCOUNTER — Telehealth: Payer: Self-pay | Admitting: Internal Medicine

## 2023-01-25 NOTE — Telephone Encounter (Signed)
Pt LMOVM for nurse to call her back. Her phone number is [phone].

## 2023-01-25 NOTE — Telephone Encounter (Signed)
Returned call to Pt.  She does not recall receiving a shock on 01/22/23.  She denies any acute symptoms before or after.  When questioned if she has been having any chest pain or shortness of breath Pt states "no more than I usually do".  Pt advised per [place] DMV guidelines she cannot drive for 6 months.  Pt states she no longer drives.  Made first available appt with EP APP that was on a Monday through Wednesday late in the day.  Pt scheduled with AT 02/11/2023 at 12:20 pm.  Will forward to covering physician for review.

## 2023-01-25 NOTE — Telephone Encounter (Signed)
Left detailed message requesting call back at Pt number on file.  Outreach made to Pt's sister [name] per [employer].  Per sister, Pt sleeps hard and does not hear the phone sometimes.  She sleeps in in the mornings.  Sister states she is usually the transportation and Monday-Wednesday in the afternoon works best for her and Pt.  Sister will contact Pt and have her call device clinic to schedule f/u appt.  Will continue to monitor.

## 2023-01-25 NOTE — Telephone Encounter (Signed)
patient called to verify times and dates of appointments.  

## 2023-01-29 ENCOUNTER — Other Ambulatory Visit (HOSPITAL_COMMUNITY): Payer: Self-pay

## 2023-01-29 ENCOUNTER — Telehealth: Payer: Self-pay | Admitting: *Deleted

## 2023-01-29 NOTE — Progress Notes (Signed)
  [employer] Care Coordination  Note  01/29/2023 Name: Diane [name] MRN: [id number] DOB: [birth year]  Diane [name] is a 67 y.o. year old female who is a primary care patient of Tower, Audrie Gallus, MD and is actively engaged with the care management team. I reached out to Raquel Sarna by phone today to assist with scheduling a follow up visit with the RN Case Manager  Follow up plan: Unsuccessful telephone outreach attempt made. A HIPAA compliant phone message was left for the patient providing contact information and requesting a return call.   [facility]  Care Coordination Care Guide  Direct Dial: [phone]

## 2023-01-30 ENCOUNTER — Telehealth: Payer: Self-pay | Admitting: Internal Medicine

## 2023-01-30 NOTE — Telephone Encounter (Signed)
Called patient regarding upcoming May appointments, patient is notified.  ?

## 2023-02-04 NOTE — Progress Notes (Signed)
  [employer] Care Coordination Note  02/04/2023 Name: Diane [name] MRN: [id number] DOB: 1956-08-26  Diane [name] is a 67 y.o. year old female who is a primary care patient of Tower, Audrie Gallus, MD and is actively engaged with the care management team. I reached out to Raquel Sarna by phone today to assist with scheduling a follow up visit with the RN Case Manager  Follow up plan: Telephone appointment with care management team member scheduled for:02/26/23  [facility] Coordination Care Guide  Direct Dial: [phone]

## 2023-02-06 ENCOUNTER — Inpatient Hospital Stay: Payer: Medicare Other | Attending: Internal Medicine

## 2023-02-06 DIAGNOSIS — C3401 Malignant neoplasm of right main bronchus: Secondary | ICD-10-CM | POA: Insufficient documentation

## 2023-02-06 DIAGNOSIS — Z452 Encounter for adjustment and management of vascular access device: Secondary | ICD-10-CM | POA: Insufficient documentation

## 2023-02-07 ENCOUNTER — Telehealth: Payer: Self-pay | Admitting: Internal Medicine

## 2023-02-08 ENCOUNTER — Inpatient Hospital Stay: Payer: Medicare Other

## 2023-02-08 ENCOUNTER — Other Ambulatory Visit: Payer: Medicare Other

## 2023-02-08 ENCOUNTER — Other Ambulatory Visit: Payer: Self-pay

## 2023-02-08 DIAGNOSIS — Z95828 Presence of other vascular implants and grafts: Secondary | ICD-10-CM

## 2023-02-08 DIAGNOSIS — Z452 Encounter for adjustment and management of vascular access device: Secondary | ICD-10-CM | POA: Diagnosis not present

## 2023-02-08 DIAGNOSIS — C3401 Malignant neoplasm of right main bronchus: Secondary | ICD-10-CM | POA: Diagnosis not present

## 2023-02-08 DIAGNOSIS — D509 Iron deficiency anemia, unspecified: Secondary | ICD-10-CM

## 2023-02-08 MED ORDER — HEPARIN SOD (PORK) LOCK FLUSH 100 UNIT/ML IV SOLN
500.0000 [IU] | Freq: Once | INTRAVENOUS | Status: AC
  Administered 2023-02-08: 500 [IU]

## 2023-02-08 MED ORDER — SODIUM CHLORIDE 0.9% FLUSH
10.0000 mL | Freq: Once | INTRAVENOUS | Status: AC
  Administered 2023-02-08: 10 mL

## 2023-02-11 ENCOUNTER — Encounter: Payer: Self-pay | Admitting: Student

## 2023-02-11 ENCOUNTER — Ambulatory Visit: Payer: Medicare Other | Attending: Student | Admitting: Student

## 2023-02-11 VITALS — BP 110/62 | HR 86 | Ht <= 58 in | Wt 118.0 lb

## 2023-02-11 DIAGNOSIS — Z720 Tobacco use: Secondary | ICD-10-CM | POA: Insufficient documentation

## 2023-02-11 DIAGNOSIS — I5022 Chronic systolic (congestive) heart failure: Secondary | ICD-10-CM | POA: Diagnosis not present

## 2023-02-11 DIAGNOSIS — I739 Peripheral vascular disease, unspecified: Secondary | ICD-10-CM | POA: Diagnosis not present

## 2023-02-11 DIAGNOSIS — I255 Ischemic cardiomyopathy: Secondary | ICD-10-CM | POA: Insufficient documentation

## 2023-02-11 DIAGNOSIS — I25708 Atherosclerosis of coronary artery bypass graft(s), unspecified, with other forms of angina pectoris: Secondary | ICD-10-CM | POA: Diagnosis not present

## 2023-02-11 LAB — CUP PACEART INCLINIC DEVICE CHECK
Date Time Interrogation Session: 20240513133434
HighPow Impedance: 81 Ohm
HighPow Impedance: 88 Ohm
Implantable Lead Connection Status: 753985
Implantable Lead Implant Date: 20211015
Implantable Lead Location: 753860
Implantable Lead Model: 672
Implantable Lead Serial Number: 166771
Implantable Pulse Generator Implant Date: 20211015
Lead Channel Impedance Value: 1189 Ohm
Lead Channel Pacing Threshold Amplitude: 0.7 V
Lead Channel Pacing Threshold Pulse Width: 0.4 ms
Lead Channel Sensing Intrinsic Amplitude: 14.8 mV
Lead Channel Setting Pacing Amplitude: 3.5 V
Lead Channel Setting Pacing Pulse Width: 0.4 ms
Lead Channel Setting Sensing Sensitivity: 0.5 mV
Pulse Gen Serial Number: 279742
Zone Setting Status: 755011

## 2023-02-11 NOTE — Progress Notes (Signed)
  Electrophysiology Office Note:   Date:  02/11/2023  ID:  Diane [name], DOB 1955-10-21, MRN [id number]  Primary Cardiologist: Peter Swaziland, MD Electrophysiologist: Lanier Prude, MD   History of Present Illness:   Diane [name] is a 67 y.o. female with very complicated cardiac history including CAD, ischemic cardiomyopathy, CVA, and valvular disease was referred by Dr. Swaziland for CHF evaluation. Patient has an extensive history of vascular disease. She had CABG in 2007. Repeat LHC in 2007 showed all grafts closed except LIMA-LAD. Patient had DES to LCx/OM. LHC 2016 showed occlusion of DES to LCx/OM with L-L and L-R collaterals. Poor targets for redo CABG seen today for acute visit due to ICD shock.    Patient reports overall feeling the same and her USOH. She was unaware of her shock and denies any recent illness or med non-compliance. Denies dyspnea walking short distance on flat ground. She is chronically fatigued. No exertional chest pain.  She is undergoing treatment for her small cell cancer. CT 01/08/2023 shwoed no progression. She does not drive.   Review of systems complete and found to be negative unless listed in HPI.   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  EKG is ordered today. Personal review shows NSR at 86 bpm     Physical Exam:   VS:  BP 110/62   Pulse 86   Ht 4\' 9"  (1.448 m)   Wt 118 lb (53.5 kg)   LMP 09/02/2003   SpO2 95%   BMI 25.53 kg/m    Wt Readings from Last 3 Encounters:  02/11/23 118 lb (53.5 kg)  01/09/23 117 lb (53.1 kg)  10/22/22 119 lb (54 kg)     GEN: Chronically ill appearing, well developed in no acute distress NECK: No JVD; No carotid bruits CARDIAC: Regular rate and rhythm with occasional ectopy, no murmurs, rubs,  gallops RESPIRATORY:  Clear to auscultation without rales, wheezing or rhonchi  ABDOMEN: Soft, non-tender, non-distended EXTREMITIES:  No edema; No deformity   ASSESSMENT AND PLAN:    Chronic systolic dysfunction s/p [employer] single chamber ICD  euvolemic today Stable on an appropriate medical regimen Normal ICD function See Pace Art report No changes today Has been intolerant to BB with hypotension Intolerant to SGLT2is with recurrent yeast infections Not on ACE/ARB. She has been hypotensive on even lower dose losartan.  VT/VF with ICD shock CL ~ 220 ms Labs today.  Options are very limited. Has not tolerated even low dose beta blockers.  Suspect amiodarone would be her only good option. We discussed today and would plan on initiating if she has further or pending MD discussion. Reviewed [place] driving restrictions. Pt does not drive.   CAD s/p CABG [facility] 2016 with severe native and graft disease with no targets for redo or PCI Denies s/s ischemia Continue ASA and plavix Continue ranolazone 1000 mg BID Continue lipids  Tobacco abuse Stressed importance of cessation  PAF Follows with Dr. Allyson Sabal  Disposition:   Follow up with EP APP in 2 months. Sooner with issues or further VT/VF.   Signed, Graciella Freer, PA-C

## 2023-02-11 NOTE — Patient Instructions (Signed)
Medication Instructions:  Your physician recommends that you continue on your current medications as directed. Please refer to the Current Medication list given to you today.  *If you need a refill on your cardiac medications before your next appointment, please call your pharmacy*  Lab Work: CMET, CBC, TSH, MAG--TODAY If you have labs (blood work) drawn today and your tests are completely normal, you will receive your results only by: MyChart Message (if you have MyChart) OR A paper copy in the mail If you have any lab test that is abnormal or we need to change your treatment, we will call you to review the results.  Follow-Up: At [facility], you and your health needs are our priority.  As part of our continuing mission to provide you with exceptional heart care, we have created designated Provider Care Teams.  These Care Teams include your primary Cardiologist (physician) and Advanced Practice Providers (APPs -  Physician Assistants and Nurse Practitioners) who all work together to provide you with the care you need, when you need it.  Your next appointment:   6-8 week(s)  Provider:   Casimiro Needle "Otilio Saber, PA-C

## 2023-02-12 LAB — COMPREHENSIVE METABOLIC PANEL
ALT: 13 IU/L (ref 0–32)
AST: 18 IU/L (ref 0–40)
Albumin/Globulin Ratio: 1.8 (ref 1.2–2.2)
Albumin: 4.4 g/dL (ref 3.9–4.9)
Alkaline Phosphatase: 73 IU/L (ref 44–121)
BUN/Creatinine Ratio: 29 — ABNORMAL HIGH (ref 12–28)
BUN: 32 mg/dL — ABNORMAL HIGH (ref 8–27)
Bilirubin Total: 0.2 mg/dL (ref 0.0–1.2)
CO2: 26 mmol/L (ref 20–29)
Calcium: 10 mg/dL (ref 8.7–10.3)
Chloride: 89 mmol/L — ABNORMAL LOW (ref 96–106)
Creatinine, Ser: 1.12 mg/dL — ABNORMAL HIGH (ref 0.57–1.00)
Globulin, Total: 2.5 g/dL (ref 1.5–4.5)
Glucose: 93 mg/dL (ref 70–99)
Potassium: 3.8 mmol/L (ref 3.5–5.2)
Sodium: 133 mmol/L — ABNORMAL LOW (ref 134–144)
Total Protein: 6.9 g/dL (ref 6.0–8.5)
eGFR: 54 mL/min/{1.73_m2} — ABNORMAL LOW (ref >59)

## 2023-02-12 LAB — CBC
Hematocrit: 43.9 % (ref 34.0–46.6)
Hemoglobin: 14.9 g/dL (ref 11.1–15.9)
MCH: 29.4 pg (ref 26.6–33.0)
MCHC: 33.9 g/dL (ref 31.5–35.7)
MCV: 87 fL (ref 79–97)
Platelets: 230 10*3/uL (ref 150–450)
RBC: 5.07 x10E6/uL (ref 3.77–5.28)
RDW: 15.4 % (ref 11.7–15.4)
WBC: 6.4 10*3/uL (ref 3.4–10.8)

## 2023-02-12 LAB — TSH: TSH: 1.48 u[IU]/mL (ref 0.450–4.500)

## 2023-02-12 LAB — MAGNESIUM: Magnesium: 1.7 mg/dL (ref 1.6–2.3)

## 2023-02-19 ENCOUNTER — Telehealth: Payer: Self-pay

## 2023-02-19 MED ORDER — AMIODARONE HCL 200 MG PO TABS: ORAL_TABLET | ORAL | 3 refills | Status: DC

## 2023-02-19 MED ORDER — MAGNESIUM OXIDE 400 MG PO CAPS: 400.0000 mg | ORAL_CAPSULE | Freq: Every day | ORAL | 3 refills | Status: DC

## 2023-02-19 NOTE — Telephone Encounter (Signed)
-----   Message from Lanier Prude, MD sent at 02/19/2023  8:17 AM EDT ----- In office device check reviewed. Plan to start amiodarone. Andy/Carly, can you follow up on this? It looks like the Amiodarone was not started after trying to reach her several times. She has an appointment with Dalton scheduled for Wednesday.  Sheria Lang T. Lalla Brothers, MD, [facility], [facility] Cardiac Electrophysiology

## 2023-02-19 NOTE — Telephone Encounter (Signed)
Graciella Freer, PA-C 02/12/2023  8:41 AM EDT     Please see instructions for starting amiodarone, but would also start on Mag ox 400 mg daily given Mg of 1.7.   Graciella Freer, PA-C 02/12/2023  8:34 AM EDT     Labs ok. Mg pending.   Discussed with Dr. Lalla Brothers and Shirlee Latch.   Please start amiodarone 200 mg BID x 2 weeks, then decrease to 200 mg daily.  Keep follow up with me as scheduled.    The patient has been notified of the result and verbalized understanding.  All questions (if any) were answered. Frutoso Schatz, RN 02/19/2023 3:35 PM  Prescriptions have been sent in.

## 2023-02-20 ENCOUNTER — Ambulatory Visit (HOSPITAL_COMMUNITY)
Admission: RE | Admit: 2023-02-20 | Discharge: 2023-02-20 | Disposition: A | Discharge status: Home / Self Care | Payer: Medicare Other | Source: Ambulatory Visit | Attending: Cardiology | Admitting: Cardiology

## 2023-02-20 ENCOUNTER — Encounter (HOSPITAL_COMMUNITY): Payer: Self-pay | Admitting: Cardiology

## 2023-02-20 VITALS — BP 110/60 | HR 87 | Wt 118.0 lb

## 2023-02-20 DIAGNOSIS — I6521 Occlusion and stenosis of right carotid artery: Secondary | ICD-10-CM | POA: Diagnosis not present

## 2023-02-20 DIAGNOSIS — M79604 Pain in right leg: Secondary | ICD-10-CM | POA: Diagnosis not present

## 2023-02-20 DIAGNOSIS — Z955 Presence of coronary angioplasty implant and graft: Secondary | ICD-10-CM | POA: Insufficient documentation

## 2023-02-20 DIAGNOSIS — Z85118 Personal history of other malignant neoplasm of bronchus and lung: Secondary | ICD-10-CM | POA: Insufficient documentation

## 2023-02-20 DIAGNOSIS — I493 Ventricular premature depolarization: Secondary | ICD-10-CM | POA: Diagnosis not present

## 2023-02-20 DIAGNOSIS — Z7982 Long term (current) use of aspirin: Secondary | ICD-10-CM | POA: Insufficient documentation

## 2023-02-20 DIAGNOSIS — Z951 Presence of aortocoronary bypass graft: Secondary | ICD-10-CM | POA: Insufficient documentation

## 2023-02-20 DIAGNOSIS — Z923 Personal history of irradiation: Secondary | ICD-10-CM | POA: Diagnosis not present

## 2023-02-20 DIAGNOSIS — Z8673 Personal history of transient ischemic attack (TIA), and cerebral infarction without residual deficits: Secondary | ICD-10-CM | POA: Insufficient documentation

## 2023-02-20 DIAGNOSIS — Z7902 Long term (current) use of antithrombotics/antiplatelets: Secondary | ICD-10-CM | POA: Diagnosis not present

## 2023-02-20 DIAGNOSIS — E1151 Type 2 diabetes mellitus with diabetic peripheral angiopathy without gangrene: Secondary | ICD-10-CM | POA: Diagnosis not present

## 2023-02-20 DIAGNOSIS — Z862 Personal history of diseases of the blood and blood-forming organs and certain disorders involving the immune mechanism: Secondary | ICD-10-CM | POA: Diagnosis not present

## 2023-02-20 DIAGNOSIS — Z4502 Encounter for adjustment and management of automatic implantable cardiac defibrillator: Secondary | ICD-10-CM | POA: Diagnosis not present

## 2023-02-20 DIAGNOSIS — I255 Ischemic cardiomyopathy: Secondary | ICD-10-CM | POA: Diagnosis not present

## 2023-02-20 DIAGNOSIS — I5022 Chronic systolic (congestive) heart failure: Secondary | ICD-10-CM | POA: Diagnosis not present

## 2023-02-20 DIAGNOSIS — I110 Hypertensive heart disease with heart failure: Secondary | ICD-10-CM | POA: Insufficient documentation

## 2023-02-20 DIAGNOSIS — G8929 Other chronic pain: Secondary | ICD-10-CM | POA: Diagnosis not present

## 2023-02-20 DIAGNOSIS — Z79899 Other long term (current) drug therapy: Secondary | ICD-10-CM | POA: Insufficient documentation

## 2023-02-20 DIAGNOSIS — I2510 Atherosclerotic heart disease of native coronary artery without angina pectoris: Secondary | ICD-10-CM | POA: Diagnosis not present

## 2023-02-20 DIAGNOSIS — Z7984 Long term (current) use of oral hypoglycemic drugs: Secondary | ICD-10-CM | POA: Diagnosis not present

## 2023-02-20 DIAGNOSIS — Z9221 Personal history of antineoplastic chemotherapy: Secondary | ICD-10-CM | POA: Insufficient documentation

## 2023-02-20 DIAGNOSIS — F17210 Nicotine dependence, cigarettes, uncomplicated: Secondary | ICD-10-CM | POA: Insufficient documentation

## 2023-02-20 DIAGNOSIS — I951 Orthostatic hypotension: Secondary | ICD-10-CM | POA: Diagnosis not present

## 2023-02-20 LAB — DIGOXIN LEVEL: Digoxin Level: 0.6 ng/mL — ABNORMAL LOW (ref 0.8–2.0)

## 2023-02-20 MED ORDER — MAGNESIUM 400 MG PO CAPS: 400.0000 mg | ORAL_CAPSULE | Freq: Every day | ORAL | 3 refills | Status: DC

## 2023-02-20 MED ORDER — POTASSIUM CHLORIDE CRYS ER 10 MEQ PO TBCR: 20.0000 meq | EXTENDED_RELEASE_TABLET | Freq: Two times a day (BID) | ORAL | 3 refills | Status: DC

## 2023-02-20 NOTE — Progress Notes (Signed)
Remote ICD transmission.   

## 2023-02-20 NOTE — Patient Instructions (Signed)
INCREASE Potassium to 20 mEq ( 2 Tabs) Twice daily  START Magnesium 400 mg ( 1 Tab) daily.  Labs done today, your results will be available in MyChart, we will contact you for abnormal readings.  You provider has ordered scan of your legs. You will be called to have this test arranged.  Your physician recommends that you schedule a follow-up appointment in: 3 months   If you have any questions or concerns before your next appointment please send [place] a message through [address] or call our office at [phone].    TO LEAVE A MESSAGE FOR THE NURSE SELECT OPTION 2, PLEASE LEAVE A MESSAGE INCLUDING: YOUR NAME DATE OF BIRTH CALL BACK NUMBER REASON FOR CALL**this is important as we prioritize the call backs  YOU WILL RECEIVE A CALL BACK THE SAME DAY AS LONG AS YOU CALL BEFORE 4:00 PM  At the [facility], you and your health needs are our priority. As part of our continuing mission to provide you with exceptional heart care, we have created designated Provider Care Teams. These Care Teams include your primary Cardiologist (physician) and Advanced Practice Providers (APPs- Physician Assistants and Nurse Practitioners) who all work together to provide you with the care you need, when you need it.   You may see any of the following providers on your designated Care Team at your next follow up: Dr Arvilla Meres Dr Marca Ancona Dr. Marcos Eke, NP Robbie Lis, [place] [facility] [place], [place] Brynda Peon, NP Karle Plumber, PharmD   Please be sure to bring in all your medications bottles to every appointment.    Thank you for choosing [facility]

## 2023-02-21 NOTE — Progress Notes (Signed)
PCP: Judy Pimple, MD Cardiology: Dr. [place]az[place] HF Cardiology: Dr. Shirlee Latch  67 y.o. with history of CAD, ischemic cardiomyopathy, CVA, and valvular disease was referred by Dr. Swaziland for CHF evaluation.  Patient has an extensive history of vascular disease.  She had CABG in 2007.  Repeat LHC in 2007 showed all grafts closed except LIMA-LAD.  Patient had DES to LCx/OM.  LHC 2016 showed occlusion of DES to LCx/OM with L-L and L-R collaterals. Poor targets for redo CABG.   In 2/21, echo was done showing EF 20-25% with moderate RV dysfunction, moderate-severe MR, moderate TR.  Despite smoking history, PFTs in 2019 looked relatively normal. She additionally had the placement of a carotid stent on the right in 1/19.    RHC was done in 4/21, showing near-normal filling pressures with CI 2.12 by Fick.  CPX in 5/21 showed peak VO2 13, VE/VCO2 slope 37 => moderate HF limitation.  The PFTs on the CPX were not markedly abnormal. She saw Dr. Allyson Sabal recently, peripheral arterial dopplers in 4/21 showed significant stenosis right SFA.   We worked her up for LVAD.  She was presented at [facility] and saw Dr. Donata Clay.  He was concerned by her small stature and small LV cavity. Concern that Heartmate 3 would not function well in her small LV, and Heartware LVAD that was thought to be more suitable for small patients has been discontinued.   We considered her for a barostimulation trial, but she does not qualify for either ANTHEM or Batwire due to carotid disease.   She is off dapagliflozin due to recurrrent GU yeast infections.  In 7/21, she had atherectomy of right SFA.  [employer] ICD placed in 10/21.   As she was turned down for LVAD at [facility], Dr. Shirlee Latch had her seen at [facility]. She underwent workup there with echo in 11/21 showing higher EF 35%. RHC, however, showed low CI 1.8.    Echo in 5/22 showed improvement in EF, now up to 45-50% with basal-mid inferolateral and anterolateral akinesis, normal RV.    Underwent bronchoscopy 12/19 for pulmonary nodules, suspicious for malignancy.  Follow up 12/22, NYHA II-III symptoms, mildly volume overloaded and torsemide increased to 60 bid.  Unfortunately diagnosed with small cell lung cancer 12/22 and has had treatment with chemo and radiation.   Echo in 11/23 with EF 40%, basal -mid inferior AK and inferolateral/anterolateral HK, RV normal, mild-moderate MR.   Patient had ICD shock in 4/23 due to VT, no prior chest pain or dyspnea.  Decision made to start amiodarone.   Today she returns for HF follow up. No further ICD discharges.  No lightheadedness/palpitations.  She is stable symptomatically.  No chest pain.  Some exertional dyspnea but more limited by chronic leg pain. Can walk on flat ground without dyspnea.  Uses walker for balance.  There has been no change in leg pain, worse at night.  No pedal ulcerations.  No orthopnea/PND. Still smoking but < 1 ppd.   ECG (personally reviewed): NSR, PVC, nonspecific T wave changes  [employer] device interrogation: Heartlogic score 3, no further VT.               Labs (4/21): K 3.7 => 3.4, creatinine 0.78 => 0.7, hgb 11.3 => 14.4 Labs (5/21): digoxin 0.6, K 3.5, creatinine 0.63 Labs (6/21): K 4.1, creatinine 0.7, LDL 61, TGs 247 Labs (7/21): K 5, creatinine 0.76, hgb 16.9.  Labs (10/21): K 4.6, creatinine 0.86 Labs (11/21): digoxin 0.9, LDL  45, HDL 27, K 4, creatinine 0.86 Labs (4/22): hgb 14.4, K 4.9, creatinine 0.81, digoxin 0.7 Labs (9/22): hgb 14.5, K 4.9, creatinine 0.8, LDL 38 Labs (4/23): K 4.3, creatinine 0.56  Labs (7/23): hgb 11.1, K 4, creatinine 0.87 Labs (9/23): LDL 34, HDL 33, TGs 186 Labs (10/23): K 3.7, creatinine 0.70 Labs (5/24): K 3.8, creatinine 1.12, TSH normal, Mg 1.7, hgb 14.9  PMH: 1. CAD: CABG 2007.  - LHC in 2007 showed all grafts closed except LIMA-LAD.  Patient had DES to LCx/OM.   - LHC 2016 showed occlusion of DES to LCx/OM with L-L and L-R collaterals.  -  Not candidate for redo CABG with poor targets.  2. Chronic systolic CHF: Ischemic cardiomyopathy.   - Echo (2/21): EF 20-25%, moderately dilated and moderately dysfunctional RV, PASP 49, severe biatrial enlargement, moderate-severe MR, severe TR, mild-moderate AS, IVC dilated.  - RHC (4/21): mean RA 6, PA 41/16, mean 29, mean PCWP 15, CI 2.12, PVR 4.3 WU - CPX (5/21): peak VO2 13, VE/VCO2 slope 37, RER 1.06 => moderate HF limitation.  - Echo (11/21): EF 35%, mild LVH.  - RHC (11/21): baseline CI 1.8, exercise peak VO2 12.2, PCWP up to 24 with exercise.  - Echo (5/22): EF 45-50%, basal-mid inferolateral and anterolateral akinesis, normal RV.  - Echo (11/23): EF 40%, basal -mid inferior AK and inferolateral/anterolateral HK, RV normal, mild-moderate MR.  3. Fe deficiency anemia: FOBT negative.  4. Carotid stenosis: Right carotid stent in 1/19.  - Carotid dopplers (7/20): 60-79% LICA stenosis.  - Carotid dopplers (6/21): 60-79% LICA stenosis.  - Carotid dopplers (6/22): 60-79% LICA stenosis.  - Carotid dopplers (10/23): 60-79% LICA stenosis, patent RICA stent.  5. H/o CVA 6. HTN 7. Type 2 diabetes 8. Hyperlipidemia 9. Seizure disorder 10. OSA: Cannot tolerate CPAP.  11. Active smoker: PFTs in 2019 were relatively normal.  She does use oxygen at night.  - PFTs (5/21): FEV1 82%, FVC 80%, ratio 102% => minimal obstruction.  12. PAD: ABIs (4/21) 0.76 on right, 0.86 on left => significant right SFA stenosis.  - Atherectomy/angioplasty right SFA.  - Peripheral arterial dopplers (2/22): 50-74% stenosis right SFA.  13. Chronic penetrating atherosclerotic ulcer descending thoracic aorta.  14. Small cell lung cancer: Diagnosed 12/22. Treated with carboplatin and etoposide, and radiation therapy. 15. Esophageal stricture with dilation 16. VT: 4/23 with ICD discharge.   Social History   Socioeconomic History   Marital status: Divorced    Spouse name: Not on file   Number of children: 0   Years  of education: Not on file   Highest education level: Not on file  Occupational History   Occupation: disabled  Tobacco Use   Smoking status: Every Day    Packs/day: 0.50    Years: 51.00    Additional pack years: 0.00    Total pack years: 25.50    Types: Cigarettes    Start date: 65    Last attempt to quit: 02/22/2020    Years since quitting: 3.0   Smokeless tobacco: Never   Tobacco comments:    Pt smokes a 1 pack a day 08/30/2021 / pl  Vaping Use   Vaping Use: Former  Substance and Sexual Activity   Alcohol use: No   Drug use: No   Sexual activity: Not Currently    Partners: Male    Birth control/protection: None  Other Topics Concern   Not on file  Social History Narrative   Right handed   Lives  alone in a one story home   Social Determinants of Health   Financial Resource Strain: Low Risk  (12/31/2022)   Overall Financial Resource Strain (CARDIA)    Difficulty of Paying Living Expenses: Not very hard  Food Insecurity: No Food Insecurity (12/31/2022)   Hunger Vital Sign    Worried About Running Out of Food in the Last Year: Never true    Ran Out of Food in the Last Year: Never true  Transportation Needs: No Transportation Needs (12/31/2022)   PRAPARE - Administrator, Civil Service (Medical): No    Lack of Transportation (Non-Medical): No  Physical Activity: Inactive (12/31/2022)   Exercise Vital Sign    Days of Exercise per Week: 0 days    Minutes of Exercise per Session: 0 min  Stress: No Stress Concern Present (12/31/2022)   [employer] of Occupational Health - Occupational Stress Questionnaire    Feeling of Stress : Not at all  Social Connections: Socially Isolated (12/31/2022)   Social Connection and Isolation Panel [NHANES]    Frequency of Communication with Friends and Family: More than three times a week    Frequency of Social Gatherings with Friends and Family: Twice a week    Attends Religious Services: Never    Database administrator or  Organizations: No    Attends Banker Meetings: Never    Marital Status: Divorced  Catering manager Violence: Not At Risk (12/31/2022)   Humiliation, Afraid, Rape, and Kick questionnaire    Fear of Current or Ex-Partner: No    Emotionally Abused: No    Physically Abused: No    Sexually Abused: No   Family History  Problem Relation Age of Onset   Heart disease Mother    Diabetes Mother    COPD Mother    Hyperlipidemia Mother    Hypertension Mother    Cancer Father        met, origin unknown   Heart attack Father    Hypertension Sister    Cancer Sister        eyelid   Glaucoma Sister    Heart disease Maternal Grandfather    Drug abuse Paternal Grandmother    Stroke Paternal Grandfather    Heart disease Maternal Aunt        x 2 aunts   Lung cancer Paternal Aunt        lung with mets to brain   Melanoma Paternal Uncle    Lung cancer Paternal Uncle        lung/liver to brain   Cancer Paternal Uncle        cancer of unknown type   Colon cancer Neg Hx    Stomach cancer Neg Hx    Esophageal cancer Neg Hx    ROS: All systems reviewed and negative except as per HPI.   Current Outpatient Medications  Medication Sig Dispense Refill   acetaminophen (TYLENOL) 500 MG tablet Take 1,000 mg by mouth at bedtime.     albuterol (PROVENTIL) (2.5 MG/3ML) 0.083% nebulizer solution Take 3 mLs (2.5 mg total) by nebulization every 6 (six) hours as needed for wheezing or shortness of breath. 1180 mL 3   albuterol (VENTOLIN HFA) 108 (90 Base) MCG/ACT inhaler INHALE 2 PUFFS EVERY 6 HOURS AS NEEDED FOR WHEEZING/SHORTNESS OF BREATH 18 each 0   amiodarone (PACERONE) 200 MG tablet Take 1 tablet (200 mg total) by mouth 2 (two) times daily for 14 days, THEN 1 tablet (200 mg total) daily.  100 tablet 3   aspirin EC 81 MG tablet Take 81 mg by mouth every evening.  30 tablet 6   BD PEN NEEDLE NANO 2ND GEN 32G X 4 MM MISC Inject into the skin 3 (three) times daily.     clopidogrel (PLAVIX) 75 MG  tablet Take 1 tablet (75 mg total) by mouth daily. 90 tablet 3   digoxin (LANOXIN) 0.125 MG tablet Take 0.5 tablets (62.5 mcg total) by mouth daily. 45 tablet 3   ezetimibe (ZETIA) 10 MG tablet Take 1 tablet (10 mg total) by mouth daily. PLEASE CALL OFFICE FOR FOLLOW UP APPOINTMENT 90 tablet 3   fluticasone (FLONASE) 50 MCG/ACT nasal spray Place 2 sprays into both nostrils daily. 48 g 3   gabapentin (NEURONTIN) 300 MG capsule TAKE 2 CAPSULES DAILY AT   BEDTIME 180 capsule 1   glucose blood (ONETOUCH ULTRA) test strip 3 (three) times daily.     guaiFENesin (MUCINEX) 600 MG 12 hr tablet Take 2 tablets (1,200 mg total) by mouth 2 (two) times daily. 360 tablet 3   HYDROcodone-acetaminophen (NORCO) 5-325 MG tablet Take 1 tablet by mouth every 6 (six) hours as needed for moderate pain. 30 tablet 0   insulin aspart protamine- aspart (NOVOLOG MIX 70/30) (70-30) 100 UNIT/ML injection Inject 55 Units into the skin 2 (two) times daily with a meal. 5 units with lunch     levETIRAcetam (KEPPRA) 1000 MG tablet TAKE 1 TABLET TWICE A DAY 180 tablet 0   Magnesium 400 MG CAPS Take 400 mg by mouth daily. 90 capsule 3   metFORMIN (GLUCOPHAGE) 500 MG tablet Take 500 mg by mouth 2 (two) times daily with a meal.     metolazone (ZAROXOLYN) 2.5 MG tablet TAKE 1 TABLET TWICE WEEKLY,EVERY MONDAY AND FRIDAY    (CHANGE IN DOSAGE). 26 tablet 3   nitroGLYCERIN (NITROLINGUAL) 0.4 MG/SPRAY spray Place 1 spray under the tongue every 5 (five) minutes x 3 doses as needed for chest pain. 12 g 3   nystatin (MYCOSTATIN/NYSTOP) powder APPLY 1 APPLICATION        TOPICALLY 4 TIMES A DAY AS NEEDED FOR YEAST INFECTIONS 60 g 5   ONE TOUCH ULTRA TEST test strip USE AS DIRECTED FOR TESTING BLOOD GLUCOSE 3 TIMES DAILY  1   pantoprazole (PROTONIX) 40 MG tablet Take 1 tablet (40 mg total) by mouth daily. 90 tablet 3   polyethylene glycol (MIRALAX / GLYCOLAX) 17 g packet Take 17 g by mouth daily.     promethazine (PHENERGAN) 12.5 MG tablet Take 1  tablet (12.5 mg total) by mouth every 6 (six) hours as needed for nausea or vomiting. 30 tablet 2   ranolazine (RANEXA) 1000 MG SR tablet TAKE 1 TABLET TWICE A DAY 180 tablet 3   rosuvastatin (CRESTOR) 20 MG tablet Take 1 tablet (20 mg total) by mouth daily. PLEASE CALL OFFICE FOR FOLLOW UP APPOINTMENT 90 tablet 3   sodium chloride (OCEAN) 0.65 % SOLN nasal spray Place 1 spray into both nostrils as needed for congestion.     spironolactone (ALDACTONE) 25 MG tablet Take 1 tablet (25 mg total) by mouth at bedtime. PLEASE CALL OFFICE FOR FOLLOW UP APPOINTMENT 90 tablet 3   torsemide (DEMADEX) 20 MG tablet TAKE 3 TABLETS (60MG  TOTAL)TWO TIMES A DAY 540 tablet 3   triamcinolone cream (KENALOG) 0.5 % Apply 1 application topically 3 (three) times daily as needed (rash). To affected areas     VASCEPA 1 g capsule TAKE 2 CAPSULES (2GRAMS  TOTAL) TWO TIMES A DAY 120 capsule 11   VERQUVO 10 MG TABS TAKE 1 TABLET ONCE DAILY 90 tablet 3   potassium chloride (KLOR-CON M) 10 MEQ tablet Take 2 tablets (20 mEq total) by mouth 2 (two) times daily. 200 tablet 3   No current facility-administered medications for this encounter.   Wt Readings from Last 3 Encounters:  02/20/23 53.5 kg (118 lb)  02/11/23 53.5 kg (118 lb)  01/09/23 53.1 kg (117 lb)   BP 110/60   Pulse 87   Wt 53.5 kg (118 lb)   LMP 09/02/2003   SpO2 97%   BMI 25.53 kg/m  General: NAD Neck: No JVD, no thyromegaly or thyroid nodule.  Lungs: Clear to auscultation bilaterally with normal respiratory effort. CV: Nondisplaced PMI.  Heart regular S1/S2, no S3/S4, no murmur.  No peripheral edema.  No carotid bruit.  Difficult to palpate pedal pulses.  Abdomen: Soft, nontender, no hepatosplenomegaly, no distention.  Skin: Intact without lesions or rashes.  Neurologic: Alert and oriented x 3.  Psych: Normal affect. Extremities: No clubbing or cyanosis.  HEENT: Normal.   Assessment/Plan:  1. Chronic systolic CHF: Ischemic cardiomyopathy, [employer] ICD.  Echo in 2/21 showed EF 20-25%, moderately dilated and moderately dysfunctional RV, PASP 49, severe biatrial enlargement, moderate-severe MR, severe TR, mild-moderate AS, IVC dilated. CPX (5/21) with moderate HF limitation, RHC (4/21) with CI low at 2.12.  Medication titration has been severely limited by hypotension and orthostatic symptoms.  Not a transplant candidate due to smoking. She was seen by cardiac surgery at [facility] and not thought to be good LVAD candidate due to small stature, small LV cavity, and redo sternotomy.  I sent her for a 2nd opinion at [facility].  She had echo there in 11/21 with EF higher at 35% but CI low on RHC at 1.8, no LVAD planned based on higher EF.  Echo in 5/22 showed EF 45-50%.  Echo in 11/23 with EF 40%, normal RV.  NYHA III symptoms chronically.  She is not volume overloaded on exam or by Heartlogic, weight stable. I do not think that VT was a sign of decompensated CHF.  - Continue torsemide 60 mg bid. Will increase KCl to 20 bid and add Mg oxide 400 mg daily based on recent labs.  - Continue vericiguat 10 mg daily.  - Continue metolazone 2.5 mg every Monday and Friday. - Continue spironolactone 25 mg daily.  - Continue digoxin 0.0625 mg daily. Check digoxin level today.  - She has not tolerated BP- active meds well, avoid Coreg and Entresto for now.  She became hypotensive with losartan.  - She did not tolerate SGLT2 inhibitor due to recurrent GU yeast infections.  2. CAD: s/p CABG 2007 complicated by early graft closure.  Repeat cath in 2007 showed only LIMA-LAD still open and she had DES to LCX/OM.  Cath in 2016 showed this stent was occluded.  She has not had targets for redo CABG and she does not have good interventional targets. No recent chest pain. - Continue ASA 81, Plavix 75 mg daily.  - Continue ranolazone 1000 bid.   - Continue Crestor 20 mg daily + Zetia 10 mg daily, and Vascepa. Good lipids 9/23. 3. Smoking: Still smoking about 1/2 ppd.   She is trying to quit.  - Does not want Chantix.  4. PAD: s/p atherectomy/angioplasty right SFA in 7/21. Still with significant claudication. Peripheral arterial dopplers in 2/22 with 50-74% right SFA stenosis.  - Follows  with Dr. Allyson Sabal for PV.  - Will repeat peripheral arterial dopplers.  5. Carotid stenosis: Stable moderate LICA stenosis with patent right carotid stent, followed yearly.     6. Fe deficiency anemia: Has been FOBT negative.  Has had Feraheme.   7. Small cell lung cancer: Finished chemo and radiation.  - She is followed by Dr. Arbutus Ped. 8. VT: ICD discharge in 4/24.  No chest pain or CHF exacerbation.  Suspect scar-mediated.  - Continue amiodarone.  Will need to follow LFTs and TSH as well as regular eye exams.   Marca Ancona  02/21/2023

## 2023-02-22 ENCOUNTER — Telehealth: Payer: Self-pay | Admitting: Internal Medicine

## 2023-02-22 NOTE — Telephone Encounter (Signed)
Rescheduled 07/10 to 07/09 due to provider on-call, left a voicemail.

## 2023-02-26 ENCOUNTER — Other Ambulatory Visit (HOSPITAL_COMMUNITY): Payer: Self-pay | Admitting: Cardiology

## 2023-02-26 ENCOUNTER — Ambulatory Visit: Payer: Self-pay | Admitting: *Deleted

## 2023-02-26 DIAGNOSIS — I739 Peripheral vascular disease, unspecified: Secondary | ICD-10-CM

## 2023-02-26 NOTE — Patient Outreach (Signed)
  Care Coordination   02/26/2023 Name: Diane [name] MRN: [id number] DOB: August 28, 1956   Care Coordination Outreach Attempts:  An unsuccessful telephone outreach was attempted for a scheduled appointment today.  Follow Up Plan:  Additional outreach attempts will be made to offer the patient care coordination information and services.   Encounter Outcome:  No Answer   Care Coordination Interventions:  No, not indicated    Kemper Durie, RN, MSN, [facility] Care Management Care Management Coordinator [phone]

## 2023-03-02 ENCOUNTER — Encounter: Payer: Self-pay | Admitting: Physician Assistant

## 2023-03-04 ENCOUNTER — Encounter: Payer: Self-pay | Admitting: Cardiology

## 2023-03-07 NOTE — Progress Notes (Signed)
NEUROLOGY FOLLOW UP OFFICE NOTE  Diane [name] [id number]  Assessment/Plan:    Symptomatic focal seizure disorder with impaired consciousness, stable  Cervical radiculopathy, stable   1. Seizure prophylaxis:  Keppra 1000mg  BID filled  2. Neuropathic pain:  gabapentin 600mg  QHS filled  3. Follow up one year    Subjective:  Diane [name] is a 67 year old right-handed woman with CAD s/p CABG, cardiomyopathy, PAF s/p ICD, VT/VF, hypertension, hyperlipidemia, type 2 diabetes mellitus, tobacco use, depression, fibromyalgia, OSA, COPD and history of stroke who follows up for seizure disorder.   UPDATE: Current medications:  Keppra 1000mg  twice daily, gabapentin 600mg  at bedtime (for neck pain)   No spells.  Last spell 03/18/13.   Being treated for lung cancer (SCC).  Finished chemo and radiation.  She is getting a follow up scan next month.    HISTORY: 1.  Seizure disorder: She was admitted to [facility] from 03/03/13 to 03/06/13 for recurrent seizure-like episodes.  These spells are were witnessed by Dr. Noel Christmas, neuro-hospitalist, and described as "head turning to the right as well as eye deviation to the right, stiffening of right extremities with flexion of right upper extremity, and convulsive movements of right extremities. Spell lasted about 40-50 seconds and resolved without intervention. Within 30 seconds patient was again awake and conversant. She was slightly confused with respect to time."  She is unable to speak during these events.  Sometimes she was aware during these events and other times she was unaware and amnestic.  She did have urinary incontinence but no tongue biting.  It is reported that she can write down what she wants to say during these spells.  Given her stroke history, she was evaluated for possible left hemispheric stroke.  By his assessment, it seemed that her speech difficulties were non-organic, seizure-like activity non-organic, and he did not  suspect acute stroke.     She had continuous EEG monitoring, which captured multiple spells without electrographic correlate.  However, she did have one captured clinical event, described as "build up to rhythmical theta activity and resultant slowing companied by clinical changes.  This may very well represent epileptiform activity.  No interictal abnormalities were noted".  Psychiatry was consulted, regarding possible pseudoseizures.  Despite the possible diagnosis of pseudoseizures, the neuro-hospitalists never made a sole diagnosis of pseudoseizures in the hospital notes.     I personally reviewed the EEG.  At 1:57:48, patient pauses, head slightly turned left, staring, with little movement and no convulsions.  She does pull at the covers a little, but no appreciable stereotypical hand movements.  When it is over, she begins moving around again.  Electrographically, she does have a build up of generalized theta activity up until 1:58:26, followed by generalized delta slowing lasting until 1:58:45.  Difficult to lateralize, but may be more prominent in the left hemisphere.  However, this appears to only be a clip of the event and the rest of the study is not available to me.  Therefore I cannot comment on the other captured spells.   She also reports episodes of tremor involving the head, hands and legs.  It is not positional.  It lasts 20 to 30 minutes.  She has had it for many years.  Blood pressure and blood sugar readings are normal.  She says her sister has Parkinson's disease.    Chronic dizziness.   MRI of brain performed on 03/03/13, which reported possible tiny acute infarct in the posterior left frontal lobe,  but neurology believed this to be artifact.  There was also mention of incidental tiny cysts in right hippocampal region.  Encephalomalacia in the right frontoparietal region, from prior remote stroke, is seen.  MRI of brain without contrast from 12/08/15 showed chronic right MCA infarct but  also showed chronic looking lacunar infarcts in the bilateral basal ganglia and left thalamus, which are new compared to prior imaging from June 2014.   Prior AEDs:  Dilantin ER 100mg    2.  Cervical radiculopathy: She has history of right sided neck pain radiating down to the right shoulder, associated with tingling from the right ear down to the shoulder.  Hot and cold compresses help too.  No numbness, pain or weakness down the arm.  Pain worse when turning neck to the left.  She has trigger finger and numbness in the fingers when driving or picking up objects, thought to be carpal tunnel syndrome.  She has history of cervical fusion in the 1980s.  PT was ineffective.  [name]  MRI of the cervical spine performed on 05/23/14 showed moderately large foraminal disc protrusion and associated osteophyte at C3-4, causing right foraminal encroachment.  Solid fusion C5 through C7 noted.  She was referred for right C4 nerve root injection, however it was not done.    PAST MEDICAL HISTORY: Past Medical History:  Diagnosis Date   Anemia    Arthritis    Carotid stenosis    a. s/p right carotid stent 10/2017.   Chronic systolic CHF (congestive heart failure) (HCC)    Colon polyps    COPD (chronic obstructive pulmonary disease) (HCC)    Coronary artery disease    post CABG in 3/07 , coronary stents    Depression    after major stroke years ago, no longer treated   Diabetes mellitus    type 2   Dyslipidemia    Fatty liver    Fibromyalgia    GERD (gastroesophageal reflux disease)    Headache    hx of    History of kidney stones    History of radiation therapy    right lung 11/15/2021-12/26/2021  Dr Antony Blackbird   Hyperlipidemia    Hypertension    Lung cancer [facility])    Myocardial infarction (HCC)    Pneumonia    hx of    Seizures (HCC)    last seizure- 03/2013    Shortness of breath dyspnea    with exertion or when fluid builds up    Sleep apnea    used to wear a cpap- not used in 3 years    Status  post dilation of esophageal narrowing    Stroke [facility]) 1993   problems with balance    Systolic murmur    known mild AS and MR   Thyroid goiter    Tobacco abuse     MEDICATIONS: Current Outpatient Medications on File Prior to Visit  Medication Sig Dispense Refill   acetaminophen (TYLENOL) 500 MG tablet Take 1,000 mg by mouth at bedtime.     albuterol (PROVENTIL) (2.5 MG/3ML) 0.083% nebulizer solution Take 3 mLs (2.5 mg total) by nebulization every 6 (six) hours as needed for wheezing or shortness of breath. 1180 mL 3   albuterol (VENTOLIN HFA) 108 (90 Base) MCG/ACT inhaler Inhale 2 puffs into the lungs every 6 (six) hours as needed for wheezing or shortness of breath. 24 g 3   aspirin EC 81 MG tablet Take 81 mg by mouth every evening.  30 tablet 6  BD PEN NEEDLE NANO 2ND GEN 32G X 4 MM MISC Inject into the skin 3 (three) times daily.     clopidogrel (PLAVIX) 75 MG tablet Take 1 tablet (75 mg total) by mouth daily. 90 tablet 3   dexlansoprazole (DEXILANT) 60 MG capsule TAKE 1 CAPSULE DAILY 90 capsule 1   digoxin (LANOXIN) 0.125 MG tablet Take 0.5 tablets (0.0625 mg total) by mouth daily. 45 tablet 3   diphenhydrAMINE (BENADRYL) 25 mg capsule Take 25 mg by mouth 2 (two) times daily as needed for itching or allergies.     ezetimibe (ZETIA) 10 MG tablet Take 1 tablet (10 mg total) by mouth daily. PLEASE CALL OFFICE FOR FOLLOW UP APPOINTMENT 90 tablet 3   fluticasone (FLONASE) 50 MCG/ACT nasal spray Place 2 sprays into both nostrils daily. (Patient taking differently: Place 2 sprays into both nostrils daily as needed for allergies.) 48 g 3   gabapentin (NEURONTIN) 300 MG capsule TAKE 2 CAPSULES DAILY AT   BEDTIME 180 capsule 1   glucose blood (ONETOUCH ULTRA) test strip 3 (three) times daily.     guaiFENesin (MUCINEX) 600 MG 12 hr tablet Take 2 tablets (1,200 mg total) by mouth 2 (two) times daily. 360 tablet 3   HYDROcodone-acetaminophen (NORCO) 5-325 MG tablet Take 1 tablet by mouth every 6  (six) hours as needed for moderate pain. 30 tablet 0   icosapent Ethyl (VASCEPA) 1 g capsule Take 2 capsules (2 g total) by mouth 2 (two) times daily. 360 capsule 3   insulin aspart protamine- aspart (NOVOLOG MIX 70/30) (70-30) 100 UNIT/ML injection Inject 5-65 Units into the skin See admin instructions. 65 units in the Am with breakfast and 5 units with lunch and 55 units with dinner     levETIRAcetam (KEPPRA) 1000 MG tablet TAKE 1 TABLET TWICE A DAY 60 tablet 0   lidocaine (XYLOCAINE) 2 % solution Use 15 mLs in the mouth or throat as directed as needed for pain. 200 mL 3   lidocaine-prilocaine (EMLA) cream Apply to the Port-A-Cath site 30-60 minutes before chemotherapy 30 g 0   metFORMIN (GLUCOPHAGE-XR) 500 MG 24 hr tablet Take 500 mg by mouth every evening.   6   metolazone (ZAROXOLYN) 2.5 MG tablet TAKE 1 TABLET TWICE WEEKLY,EVERY MONDAY AND FRIDAY    (CHANGE IN DOSAGE). 26 tablet 3   nicotine polacrilex (NICORETTE) 4 MG gum Take 4 mg by mouth daily as needed for smoking cessation.     nitroGLYCERIN (NITROLINGUAL) 0.4 MG/SPRAY spray Place 1 spray under the tongue every 5 (five) minutes x 3 doses as needed for chest pain. 12 g 3   NOVOLOG MIX 70/30 FLEXPEN (70-30) 100 UNIT/ML FlexPen Inject into the skin.     nystatin (MYCOSTATIN/NYSTOP) powder APPLY 1 APPLICATION        TOPICALLY 4 TIMES A DAY AS NEEDED FOR YEAST INFECTIONS 60 g 5   ONE TOUCH ULTRA TEST test strip USE AS DIRECTED FOR TESTING BLOOD GLUCOSE 3 TIMES DAILY  1   polyethylene glycol (MIRALAX / GLYCOLAX) 17 g packet Take 17 g by mouth daily.     potassium chloride SA (KLOR-CON M20) 20 MEQ tablet Take 2 tablets (40 mEq total) by mouth every morning AND 1 tablet (20 mEq total) every evening. 270 tablet 3   prochlorperazine (COMPAZINE) 10 MG tablet Take 1 tablet (10 mg total) by mouth every 6 (six) hours as needed for nausea or vomiting. 30 tablet 0   promethazine (PHENERGAN) 12.5 MG tablet Take 1 tablet (  12.5 mg total) by mouth every 6  (six) hours as needed for nausea or vomiting. 30 tablet 2   ranolazine (RANEXA) 1000 MG SR tablet Take 1 tablet (1,000 mg total) by mouth 2 (two) times daily. 180 tablet 3   rosuvastatin (CRESTOR) 20 MG tablet Take 1 tablet (20 mg total) by mouth daily. PLEASE CALL OFFICE FOR FOLLOW UP APPOINTMENT 90 tablet 3   sodium chloride (OCEAN) 0.65 % SOLN nasal spray Place 1 spray into both nostrils as needed for congestion.     spironolactone (ALDACTONE) 25 MG tablet Take 1 tablet (25 mg total) by mouth at bedtime. PLEASE CALL OFFICE FOR FOLLOW UP APPOINTMENT 90 tablet 3   sucralfate (CARAFATE) 1 g tablet Take 1 tablet (1 g total) by mouth 4 (four) times daily -  with meals and at bedtime. Crush and dissolve in 10 mL of warm water prior to swallowing 120 tablet 1   torsemide (DEMADEX) 20 MG tablet Take 3 tablets (60 mg total) by mouth 2 (two) times daily. 540 tablet 3   triamcinolone cream (KENALOG) 0.5 % Apply 1 application topically 3 (three) times daily as needed (rash). To affected areas     VERQUVO 10 MG TABS TAKE 1 TABLET ONCE DAILY 90 tablet 3   No current facility-administered medications on file prior to visit.    ALLERGIES: Allergies  Allergen Reactions   Bee Venom Itching and Swelling   Benadryl [Diphenhydramine] Other (See Comments)    Insomnia    Entresto [Sacubitril-Valsartan] Cough   Farxiga [Dapagliflozin]     Yeast infection   Tetracycline     Unknown, pt cannot recall exact reaction   Canagliflozin Other (See Comments)   Morphine Sulfate Er Other (See Comments)    "Severe chest pain"-pt will not take anymore   Sitagliptin Other (See Comments)   Ace Inhibitors Cough    FAMILY HISTORY: Family History  Problem Relation Age of Onset   Heart disease Mother    Diabetes Mother    COPD Mother    Hyperlipidemia Mother    Hypertension Mother    Cancer Father        met, origin unknown   Heart attack Father    Hypertension Sister    Cancer Sister        eyelid   Glaucoma  Sister    Heart disease Maternal Grandfather    Drug abuse Paternal Grandmother    Stroke Paternal Grandfather    Heart disease Maternal Aunt        x 2 aunts   Lung cancer Paternal Aunt        lung with mets to brain   Melanoma Paternal Uncle    Lung cancer Paternal Uncle        lung/liver to brain   Cancer Paternal Uncle        cancer of unknown type   Colon cancer Neg Hx    Stomach cancer Neg Hx    Esophageal cancer Neg Hx       Objective:  Blood pressure (!) 110/50, pulse 98, height 5' (1.524 m), weight 116 lb (52.6 kg), last menstrual period 09/02/2003, SpO2 96 %. General: No acute distress.  Patient appears well-groomed.   Head:  Normocephalic/atraumatic Eyes:  Fundi examined but not visualized Neck: supple, no paraspinal tenderness, full range of motion Heart:  Regular rate and rhythm Neurological Exam: alert and oriented.  Speech fluent and not dysarthric, language intact.  CN II-XII intact. Bulk and tone normal, muscle strength  5/5 throughout.  Sensation to light touch intact.  Deep tendon reflexes 2+ throughout.  Finger to nose testing intact.  Gait steady, Romberg negative.   Shon Millet, DO  CC: [name], MD

## 2023-03-11 ENCOUNTER — Ambulatory Visit (INDEPENDENT_AMBULATORY_CARE_PROVIDER_SITE_OTHER): Payer: Medicare Other | Admitting: Neurology

## 2023-03-11 ENCOUNTER — Other Ambulatory Visit: Payer: Self-pay | Admitting: Medical Oncology

## 2023-03-11 VITALS — BP 110/50 | HR 98 | Ht 60.0 in | Wt 116.0 lb

## 2023-03-11 DIAGNOSIS — G40009 Localization-related (focal) (partial) idiopathic epilepsy and epileptic syndromes with seizures of localized onset, not intractable, without status epilepticus: Secondary | ICD-10-CM | POA: Diagnosis not present

## 2023-03-11 DIAGNOSIS — M5010 Cervical disc disorder with radiculopathy, unspecified cervical region: Secondary | ICD-10-CM | POA: Diagnosis not present

## 2023-03-11 DIAGNOSIS — C3490 Malignant neoplasm of unspecified part of unspecified bronchus or lung: Secondary | ICD-10-CM

## 2023-03-11 MED ORDER — LEVETIRACETAM 1000 MG PO TABS
1000.0000 mg | ORAL_TABLET | Freq: Two times a day (BID) | ORAL | 3 refills | Status: AC
Start: 2023-03-11 — End: ?

## 2023-03-11 MED ORDER — GABAPENTIN 300 MG PO CAPS: ORAL_CAPSULE | ORAL | 3 refills | Status: DC

## 2023-03-11 NOTE — Patient Instructions (Signed)
Keppra Gabapentin

## 2023-03-13 ENCOUNTER — Inpatient Hospital Stay: Payer: Medicare Other | Attending: Internal Medicine

## 2023-03-13 ENCOUNTER — Inpatient Hospital Stay: Payer: Medicare Other

## 2023-03-13 ENCOUNTER — Telehealth: Payer: Self-pay | Admitting: Student

## 2023-03-13 NOTE — Telephone Encounter (Signed)
Patient states she has been feeling "shaky" and uneven on her feet for the last 3 days. She believes it is the amiodarone, she is now on her 3rd week of taking this medication.  She states she has been hydrating well and denies any other symptoms. She reports she has fallen twice (reports no injuries) and states she had to cancel her appt with the cancer center because she's afraid to get up to get dressed d/t falls.  Will forward to Otilio Saber, PA-C to review and advise.

## 2023-03-13 NOTE — Telephone Encounter (Signed)
Patient said that Diane [name] put her on medication and she said that it makes her very shaky and uneven on the feet.

## 2023-03-13 NOTE — Telephone Encounter (Signed)
Returned call to patient and discussed reply from Otilio Saber, PA-C:   She should now be taking it once daily now that she has taken it BID for two weeks.  Have her symptoms improved on the lower dose?  If not, we could try her back OFF of it for a couple of weeks and see if that helps.  But would need close follow up to keep an eye on her VT.    Patient states she is still taking Amiodarone 200mg  BID as she did not start the medication until after Memorial Day (around 02/28/23).  Instructed patient to go ahead and reduce dose to 200mg  QD. Advised patient to call our office in the next 5-7 days to give [place] an update on her symptoms or any time if symptoms worsen. Patient verbalized understanding.

## 2023-03-14 ENCOUNTER — Telehealth: Payer: Self-pay | Admitting: Internal Medicine

## 2023-03-14 NOTE — Telephone Encounter (Signed)
Returning call from voicemail, patient wanted to schedule appointment prior to CT SCAN, Patient is aware of upcoming appointment time/date

## 2023-03-18 ENCOUNTER — Ambulatory Visit (HOSPITAL_COMMUNITY)
Admission: RE | Admit: 2023-03-18 | Discharge: 2023-03-18 | Disposition: A | Discharge status: Home / Self Care | Payer: Medicare Other | Source: Ambulatory Visit | Attending: Cardiology | Admitting: Cardiology

## 2023-03-18 DIAGNOSIS — I739 Peripheral vascular disease, unspecified: Secondary | ICD-10-CM | POA: Diagnosis not present

## 2023-03-18 DIAGNOSIS — G8929 Other chronic pain: Secondary | ICD-10-CM | POA: Insufficient documentation

## 2023-03-18 DIAGNOSIS — M79604 Pain in right leg: Secondary | ICD-10-CM | POA: Insufficient documentation

## 2023-03-18 LAB — VAS US ABI WITH/WO TBI
Left ABI: 0.54
Right ABI: 0.81

## 2023-03-19 NOTE — Telephone Encounter (Signed)
Spoke to the patient, she has fallen 3 more times since contacting the clinic. Pt stated she fell on 6/12, 6/13 (night) and this morning. Pt stated she did not hit her head, currently symptomatic feeling weak ,shaken, lightheaded and off balance since last Wednesday . Pt bp this morning 105/76 HR 60, pt will like to try another medication. Explained ED precautions, pt voiced understanding. Will forward to APP for advise.

## 2023-03-19 NOTE — Telephone Encounter (Signed)
Spoke to the pt, explained Alejandro Mulling, PA-C recommendation :   Would stop amiodarone and follow up with me as scheduled next week.  Patient voiced understanding.

## 2023-03-19 NOTE — Telephone Encounter (Signed)
Pt c/o medication issue:  1. Name of Medication: Amiodarone  2. How are you currently taking this medication (dosage and times per day)?  1 time a day  3. Are you having a reaction (difficulty breathing--STAT)?   4. What is your medication issue? She fell 2 times , since she talked to the nurse on last Wednesday(03-12-22)

## 2023-03-20 ENCOUNTER — Telehealth: Payer: Self-pay | Admitting: *Deleted

## 2023-03-20 ENCOUNTER — Telehealth (HOSPITAL_COMMUNITY): Payer: Self-pay

## 2023-03-20 NOTE — Telephone Encounter (Addendum)
Pt aware, agreeable, and verbalized understanding   ----- Message from Laurey Morale, MD sent at 03/20/2023  8:44 AM EDT ----- Significant PAD, appears a bit worse. Please have her see her vascular MD.

## 2023-03-20 NOTE — Progress Notes (Signed)
  Care Coordination Note  03/20/2023 Name: Diane [name] MRN: [id number] DOB: [birth year]  [name] is a 67 y.o. year old female who is a primary care patient of Tower, Audrie Gallus, MD and is actively engaged with the care management team. I reached out to Raquel Sarna by phone today to assist with re-scheduling a follow up visit with the RN Case Manager  Follow up plan: Unsuccessful telephone outreach attempt made. A HIPAA compliant phone message was left for the patient providing contact information and requesting a return call.   Burman Nieves, CCMA Care Coordination Care Guide Direct Dial: [phone]

## 2023-03-22 ENCOUNTER — Other Ambulatory Visit: Payer: Medicare Other

## 2023-03-25 ENCOUNTER — Other Ambulatory Visit (HOSPITAL_COMMUNITY): Payer: Self-pay | Admitting: Cardiology

## 2023-03-25 MED ORDER — SPIRONOLACTONE 25 MG PO TABS: 25.0000 mg | ORAL_TABLET | Freq: Every day | ORAL | 3 refills | Status: DC

## 2023-03-26 ENCOUNTER — Ambulatory Visit: Payer: Medicare Other | Attending: Student | Admitting: Student

## 2023-03-26 ENCOUNTER — Encounter: Payer: Self-pay | Admitting: Student

## 2023-03-26 VITALS — BP 108/66 | HR 74 | Ht <= 58 in | Wt 120.6 lb

## 2023-03-26 DIAGNOSIS — I25708 Atherosclerosis of coronary artery bypass graft(s), unspecified, with other forms of angina pectoris: Secondary | ICD-10-CM | POA: Insufficient documentation

## 2023-03-26 DIAGNOSIS — I255 Ischemic cardiomyopathy: Secondary | ICD-10-CM | POA: Diagnosis not present

## 2023-03-26 DIAGNOSIS — I5022 Chronic systolic (congestive) heart failure: Secondary | ICD-10-CM | POA: Insufficient documentation

## 2023-03-26 NOTE — Patient Instructions (Addendum)
Medication Instructions:  Your physician recommends that you continue on your current medications as directed. Please refer to the Current Medication list given to you today.  *If you need a refill on your cardiac medications before your next appointment, please call your pharmacy*  Lab Work: BMET, CBC, MAG-TODAY If you have labs (blood work) drawn today and your tests are completely normal, you will receive your results only by: MyChart Message (if you have MyChart) OR A paper copy in the mail If you have any lab test that is abnormal or we need to change your treatment, we will call you to review the results.  Follow-Up: At [facility] you and your health needs are our priority.  As part of our continuing mission to provide you with exceptional heart care, we have created designated Provider Care Teams.  These Care Teams include your primary Cardiologist (physician) and Advanced Practice Providers (APPs -  Physician Assistants and Nurse Practitioners) who all work together to provide you with the care you need, when you need it.  Your next appointment:   6 week(s)  Provider:   Casimiro Needle "Otilio Saber, PA-C

## 2023-03-26 NOTE — Progress Notes (Unsigned)
  Electrophysiology Office Note:   ID:  Quanesha, Klimaszewski May 15, 1956, MRN [id number]  Primary Cardiologist: Peter Swaziland, MD Electrophysiologist: Lanier Prude, MD  {Click to update primary MD,subspecialty MD or APP then REFRESH:1}    History of Present Illness:   ALLE DIFABIO is a 67 y.o. female with h/o CAD, ischemic cardiomyopathy, CVA, and valvular disease was referred by Dr. Swaziland for CHF evaluation. Patient has an extensive history of vascular disease. She had CABG in 2007. Repeat LHC in 2007 showed all grafts closed except LIMA-LAD. Patient had DES to LCx/OM. LHC 2016 showed occlusion of DES to LCx/OM with L-L and L-R collaterals. Poor targets for redo CABG seen today for routine electrophysiology followup.   Seen in clinic 02/11/2023 for ICD shock of which she was unaware. Started on amiodarone and within a short period started to have falls, weakness, imbalance, and lightheadedness. This persisted into the reduced dose of the taper and it was held.   Today, she is feeling somewhat better. She had a great day Sunday, but again has had some imbalance yesterday and today. These symptoms were brand new to her after starting amiodarone. She has not had any further arrhythmia. No new symptoms of SOB or CP. No peripheral edema or syncope.   Review of systems complete and found to be negative unless listed in HPI.   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  EKG is not ordered today. EKG from 02/20/2023 reviewed which showed NSR at 93 bpm with a PVC   Physical Exam:   VS:  LMP 09/02/2003    Wt Readings from Last 3 Encounters:  03/11/23 116 lb (52.6 kg)  02/20/23 118 lb (53.5 kg)  02/11/23 118 lb (53.5 kg)     GEN: Well nourished, well developed in no acute  distress NECK: No JVD; No carotid bruits CARDIAC: Regular rate and rhythm, no murmurs, rubs, gallops RESPIRATORY:  Clear to auscultation without rales, wheezing or rhonchi  ABDOMEN: Soft, non-tender, non-distended EXTREMITIES:  No edema; No deformity   ASSESSMENT AND PLAN:    Chronic systolic dysfunction s/p [employer] single chamber ICD  euvolemic today Stable on an appropriate medical regimen Normal ICD function See Pace Art report No changes today Has been intolerant to BB with hypotension Intolerant to SGLT2is with recurrent yeast infections Not on ACE/ARB. She has been hypotensive on even lower dose losartan.   VT/VF with ICD shock CL ~ 220 ms Labs again today.  Started on amiodarone and had imbalance, dizziness, and frequent falls. It was held and she has noted some improvement.   Has not tolerated even low dose BB.  *** ? Mexitil Reviewed [place] driving restrictions. Pt does not drive.    CAD s/p CABG [facility] 2016 with severe native and graft disease with no targets for redo or PCI Denies s/s ischemia Continue ASA and plavix Continue ranolazone 1000 mg BID Continue lipids   Tobacco abuse Stressed importance of cessation   PAF Follows with Dr. Allyson Sabal   Disposition:   Follow up with {EPPROVIDERS:28135} {EPFOLLOW QM:57846}   Signed, Graciella Freer, PA-C

## 2023-03-27 ENCOUNTER — Telehealth: Payer: Self-pay

## 2023-03-27 LAB — CBC
Hematocrit: 39.9 % (ref 34.0–46.6)
Hemoglobin: 13.6 g/dL (ref 11.1–15.9)
MCH: 29.9 pg (ref 26.6–33.0)
MCHC: 34.1 g/dL (ref 31.5–35.7)
MCV: 88 fL (ref 79–97)
Platelets: 211 10*3/uL (ref 150–450)
RBC: 4.55 x10E6/uL (ref 3.77–5.28)
RDW: 15.3 % (ref 11.7–15.4)
WBC: 7.6 10*3/uL (ref 3.4–10.8)

## 2023-03-27 LAB — BASIC METABOLIC PANEL
BUN/Creatinine Ratio: 26 (ref 12–28)
BUN: 28 mg/dL — ABNORMAL HIGH (ref 8–27)
CO2: 29 mmol/L (ref 20–29)
Calcium: 9.4 mg/dL (ref 8.7–10.3)
Chloride: 91 mmol/L — ABNORMAL LOW (ref 96–106)
Creatinine, Ser: 1.06 mg/dL — ABNORMAL HIGH (ref 0.57–1.00)
Glucose: 123 mg/dL — ABNORMAL HIGH (ref 70–99)
Potassium: 3.6 mmol/L (ref 3.5–5.2)
Sodium: 135 mmol/L (ref 134–144)
eGFR: 57 mL/min/{1.73_m2} — ABNORMAL LOW (ref >59)

## 2023-03-27 LAB — MAGNESIUM: Magnesium: 1.5 mg/dL — ABNORMAL LOW (ref 1.6–2.3)

## 2023-03-27 MED ORDER — MAGNESIUM 400 MG PO TABS: 400.0000 mg | ORAL_TABLET | Freq: Two times a day (BID) | ORAL | 3 refills | Status: AC

## 2023-03-27 NOTE — Telephone Encounter (Signed)
The patient has been notified of the result and verbalized understanding.  All questions (if any) were answered. Frutoso Schatz, RN 03/27/2023 9:51 AM   Patient picked up magnesium yesterday - she is aware to take 400 mg twice daily. She will call back if she wishes to start Mexitil

## 2023-03-27 NOTE — Telephone Encounter (Signed)
-----   Message from Graciella Freer, PA-C sent at 03/27/2023  9:38 AM EDT ----- Please have her take extra 40 meq of potassium x 1.   Please stress the importance of picking up her Magnesium, and would actually increase the prescription to 400 mg BID.   She stated at yesterdays visit she had not yet gotten the prescription.   Please reinforce if her dizziness improves and she want's to try the new medicine we discussed (mexitil) for her to call prior to her follow up.

## 2023-04-05 ENCOUNTER — Telehealth: Payer: Self-pay | Admitting: Medical Oncology

## 2023-04-05 NOTE — Telephone Encounter (Signed)
Asking for Diane [name] to add an iron level to her labs next Monday because " I fell 4 x last week". It is already ordered.

## 2023-04-05 NOTE — Progress Notes (Signed)
  Care Coordination Note  04/05/2023 Name: ROSEALEE HUNTRESS MRN: [id number] DOB: 08-10-1956  MARJORIE MARENO is a 67 y.o. year old female who is a primary care patient of Tower, Audrie Gallus, MD and is actively engaged with the care management team. I reached out to Raquel Sarna by phone today to assist with re-scheduling a follow up visit with the RN Case Manager  Follow up plan: We have been unable to make contact with the patient for follow up.   Burman Nieves, CCMA Care Coordination Care Guide Direct Dial: [phone]

## 2023-04-08 ENCOUNTER — Inpatient Hospital Stay: Payer: Medicare Other | Attending: Internal Medicine

## 2023-04-08 ENCOUNTER — Ambulatory Visit (HOSPITAL_COMMUNITY)
Admission: RE | Admit: 2023-04-08 | Discharge: 2023-04-08 | Disposition: A | Discharge status: Home / Self Care | Payer: Medicare Other | Source: Ambulatory Visit | Attending: Internal Medicine | Admitting: Internal Medicine

## 2023-04-08 ENCOUNTER — Other Ambulatory Visit: Payer: Self-pay

## 2023-04-08 ENCOUNTER — Encounter (HOSPITAL_COMMUNITY): Payer: Self-pay

## 2023-04-08 DIAGNOSIS — C3490 Malignant neoplasm of unspecified part of unspecified bronchus or lung: Secondary | ICD-10-CM

## 2023-04-08 DIAGNOSIS — I7121 Aneurysm of the ascending aorta, without rupture: Secondary | ICD-10-CM | POA: Diagnosis not present

## 2023-04-08 DIAGNOSIS — Z95828 Presence of other vascular implants and grafts: Secondary | ICD-10-CM

## 2023-04-08 DIAGNOSIS — J439 Emphysema, unspecified: Secondary | ICD-10-CM | POA: Diagnosis not present

## 2023-04-08 DIAGNOSIS — D509 Iron deficiency anemia, unspecified: Secondary | ICD-10-CM | POA: Insufficient documentation

## 2023-04-08 DIAGNOSIS — C3401 Malignant neoplasm of right main bronchus: Secondary | ICD-10-CM | POA: Insufficient documentation

## 2023-04-08 LAB — CMP (CANCER CENTER ONLY)
ALT: 11 U/L (ref 0–44)
AST: 15 U/L (ref 15–41)
Albumin: 4.1 g/dL (ref 3.5–5.0)
Alkaline Phosphatase: 66 U/L (ref 38–126)
Anion gap: 7 (ref 5–15)
BUN: 24 mg/dL — ABNORMAL HIGH (ref 8–23)
CO2: 31 mmol/L (ref 22–32)
Calcium: 9.8 mg/dL (ref 8.9–10.3)
Chloride: 92 mmol/L — ABNORMAL LOW (ref 98–111)
Creatinine: 0.95 mg/dL (ref 0.44–1.00)
GFR, Estimated: >60 mL/min (ref >60)
Glucose, Bld: 100 mg/dL — ABNORMAL HIGH (ref 70–99)
Potassium: 4 mmol/L (ref 3.5–5.1)
Sodium: 130 mmol/L — ABNORMAL LOW (ref 135–145)
Total Bilirubin: 0.4 mg/dL (ref 0.3–1.2)
Total Protein: 7.3 g/dL (ref 6.5–8.1)

## 2023-04-08 LAB — IRON AND IRON BINDING CAPACITY (CC-WL,HP ONLY)
Iron: 64 ug/dL (ref 28–170)
Saturation Ratios: 10 % — ABNORMAL LOW (ref 10.4–31.8)
TIBC: 623 ug/dL — ABNORMAL HIGH (ref 250–450)
UIBC: 559 ug/dL — ABNORMAL HIGH (ref 148–442)

## 2023-04-08 LAB — CBC WITH DIFFERENTIAL (CANCER CENTER ONLY)
Abs Immature Granulocytes: 0.01 10*3/uL (ref 0.00–0.07)
Basophils Absolute: 0 10*3/uL (ref 0.0–0.1)
Basophils Relative: 1 %
Eosinophils Absolute: 0.1 10*3/uL (ref 0.0–0.5)
Eosinophils Relative: 2 %
HCT: 42 % (ref 36.0–46.0)
Hemoglobin: 14.4 g/dL (ref 12.0–15.0)
Immature Granulocytes: 0 %
Lymphocytes Relative: 16 %
Lymphs Abs: 0.9 10*3/uL (ref 0.7–4.0)
MCH: 30.3 pg (ref 26.0–34.0)
MCHC: 34.3 g/dL (ref 30.0–36.0)
MCV: 88.4 fL (ref 80.0–100.0)
Monocytes Absolute: 0.6 10*3/uL (ref 0.1–1.0)
Monocytes Relative: 12 %
Neutro Abs: 3.8 10*3/uL (ref 1.7–7.7)
Neutrophils Relative %: 69 %
Platelet Count: 219 10*3/uL (ref 150–400)
RBC: 4.75 MIL/uL (ref 3.87–5.11)
RDW: 15.4 % (ref 11.5–15.5)
WBC Count: 5.4 10*3/uL (ref 4.0–10.5)
nRBC: 0 % (ref 0.0–0.2)

## 2023-04-08 LAB — FERRITIN: Ferritin: 12 ng/mL (ref 11–307)

## 2023-04-08 MED ORDER — IOHEXOL 300 MG/ML  SOLN
75.0000 mL | Freq: Once | INTRAMUSCULAR | Status: AC | PRN
  Administered 2023-04-08: 75 mL via INTRAVENOUS

## 2023-04-08 MED ORDER — HEPARIN SOD (PORK) LOCK FLUSH 100 UNIT/ML IV SOLN
INTRAVENOUS | Status: AC
  Filled 2023-04-08: qty 5

## 2023-04-08 MED ORDER — HEPARIN SOD (PORK) LOCK FLUSH 100 UNIT/ML IV SOLN
500.0000 [IU] | Freq: Once | INTRAVENOUS | Status: AC
  Administered 2023-04-08: 500 [IU] via INTRAVENOUS

## 2023-04-08 MED ORDER — SODIUM CHLORIDE 0.9% FLUSH
10.0000 mL | Freq: Once | INTRAVENOUS | Status: AC
  Administered 2023-04-08: 10 mL

## 2023-04-08 MED ORDER — SODIUM CHLORIDE (PF) 0.9 % IJ SOLN
INTRAMUSCULAR | Status: AC
  Filled 2023-04-08: qty 50

## 2023-04-09 ENCOUNTER — Other Ambulatory Visit: Payer: Medicare Other

## 2023-04-09 ENCOUNTER — Inpatient Hospital Stay (HOSPITAL_BASED_OUTPATIENT_CLINIC_OR_DEPARTMENT_OTHER): Payer: Medicare Other | Admitting: Internal Medicine

## 2023-04-09 VITALS — BP 127/70 | HR 85 | Temp 97.6°F | Resp 16 | Ht <= 58 in | Wt 117.0 lb

## 2023-04-09 DIAGNOSIS — D509 Iron deficiency anemia, unspecified: Secondary | ICD-10-CM | POA: Diagnosis not present

## 2023-04-09 DIAGNOSIS — C3490 Malignant neoplasm of unspecified part of unspecified bronchus or lung: Secondary | ICD-10-CM

## 2023-04-09 DIAGNOSIS — D508 Other iron deficiency anemias: Secondary | ICD-10-CM

## 2023-04-09 DIAGNOSIS — C3401 Malignant neoplasm of right main bronchus: Secondary | ICD-10-CM | POA: Diagnosis not present

## 2023-04-09 NOTE — Addendum Note (Signed)
Addended by: Charma Igo on: 04/09/2023 02:56 PM   Modules accepted: Orders

## 2023-04-09 NOTE — Progress Notes (Signed)
[facility] [phone]   [phone]  OFFICE PROGRESS NOTE  Tower, Audrie Gallus, MD [address] [place] [address]  DIAGNOSIS:  1) Limited stage (T3, N2, M0) small cell lung cancer presented with right hilar and mediastinal lymphadenopathy. There was no hypermetabolism in the left adrenal gland lesion. Diagnosed in December 2022. 2)  Iron Deficiency Anemia   PRIOR THERAPY:  1) Systemic chemotherapy with systemic chemotherapy with carboplatin for an AUC of 5, etoposide 100 mg per metered squared, and Imfinzi 1500 mg IV every 3 weeks with Cosela for myeloprotection.  First dose on 10/16/2021.  Discontinued after 1 cycle as the patient staging PET scan showed limited stage disease. 2) systemic chemotherapy with carboplatin for an AUC of 5 on day 1 and etoposide 100 mg/m2 on days 1, 2, and 3 IV every 3 weeks.  First dose on 11/06/2021.  This will be concurrent with radiotherapy under the care of Dr. Roselind Messier.  Status post 3 cycles. 3) Venofer infusions as needed. Last dose on 02/07/21    CURRENT THERAPY: Observation  INTERVAL HISTORY: Diane [name] 67 y.o. female returns to the clinic today for 3 months follow-up visit.  The patient is feeling fine today with no concerning complaints except for the baseline fatigue and shortness of breath with exertion.  She denied having any current chest pain, cough or hemoptysis.  She has no nausea, vomiting, diarrhea or constipation.  She has no headache or visual changes.  She has no recent weight loss or night sweats.  She did not take her iron supplement recently.  She is here for evaluation with repeat blood work as well as CT scan of the chest for restaging of her disease.  MEDICAL HISTORY: Past Medical History:  Diagnosis Date   Anemia    Arthritis    Carotid stenosis    a. s/p right carotid stent 10/2017.   Chronic systolic CHF (congestive heart failure) (HCC)    Colon polyps    COPD (chronic  obstructive pulmonary disease) (HCC)    Coronary artery disease    post CABG in 3/07 , coronary stents    Depression    after major stroke years ago, no longer treated   Diabetes mellitus    type 2   Dyslipidemia    Fatty liver    Fibromyalgia    GERD (gastroesophageal reflux disease)    Headache    hx of    History of kidney stones    History of radiation therapy    right lung 11/15/2021-12/26/2021  Dr Antony Blackbird   Hyperlipidemia    Hypertension    Lung cancer [facility])    Myocardial infarction (HCC)    Pneumonia    hx of    Seizures (HCC)    last seizure- 03/2013    Shortness of breath dyspnea    with exertion or when fluid builds up    Sleep apnea    used to wear a cpap- not used in 3 years    Status post dilation of esophageal narrowing    Stroke [facility]) 1993   problems with balance    Systolic murmur    known mild AS and MR   Thyroid goiter    Tobacco abuse     ALLERGIES:  is allergic to bee venom, benadryl [diphenhydramine], entresto [sacubitril-valsartan], farxiga [dapagliflozin], tetracycline, canagliflozin, sitagliptin, and ace inhibitors.  MEDICATIONS:  Current Outpatient Medications  Medication Sig Dispense Refill  acetaminophen (TYLENOL) 500 MG tablet Take 1,000 mg by mouth at bedtime.     albuterol (VENTOLIN HFA) 108 (90 Base) MCG/ACT inhaler INHALE 2 PUFFS EVERY 6 HOURS AS NEEDED FOR WHEEZING/SHORTNESS OF BREATH 18 each 0   amiodarone (PACERONE) 200 MG tablet Take 1 tablet (200 mg total) by mouth 2 (two) times daily for 14 days, THEN 1 tablet (200 mg total) daily. (Patient not taking: Reported on 03/26/2023) 100 tablet 3   AREXVY 120 MCG/0.5ML injection      aspirin EC 81 MG tablet Take 81 mg by mouth every evening.  30 tablet 6   BD PEN NEEDLE NANO 2ND GEN 32G X 4 MM MISC Inject into the skin 3 (three) times daily.     clopidogrel (PLAVIX) 75 MG tablet Take 1 tablet (75 mg total) by mouth daily. 90 tablet 3   digoxin (LANOXIN) 0.125 MG tablet Take 0.5 tablets  (62.5 mcg total) by mouth daily. 45 tablet 3   ezetimibe (ZETIA) 10 MG tablet Take 1 tablet (10 mg total) by mouth daily. PLEASE CALL OFFICE FOR FOLLOW UP APPOINTMENT 90 tablet 3   fluticasone (FLONASE) 50 MCG/ACT nasal spray Place 2 sprays into both nostrils daily. 48 g 3   gabapentin (NEURONTIN) 300 MG capsule TAKE 2 CAPSULES DAILY AT   BEDTIME 180 capsule 3   glucose blood (ONETOUCH ULTRA) test strip 3 (three) times daily.     guaiFENesin (MUCINEX) 600 MG 12 hr tablet Take 2 tablets (1,200 mg total) by mouth 2 (two) times daily. 360 tablet 3   HYDROcodone-acetaminophen (NORCO) 5-325 MG tablet Take 1 tablet by mouth every 6 (six) hours as needed for moderate pain. 30 tablet 0   insulin aspart protamine- aspart (NOVOLOG MIX 70/30) (70-30) 100 UNIT/ML injection Inject 55 Units into the skin 2 (two) times daily with a meal. 5 units with lunch     levETIRAcetam (KEPPRA) 1000 MG tablet Take 1 tablet (1,000 mg total) by mouth 2 (two) times daily. 180 tablet 3   Magnesium 400 MG TABS Take 400 mg by mouth 2 (two) times daily. 180 tablet 3   metFORMIN (GLUCOPHAGE) 500 MG tablet Take 500 mg by mouth 2 (two) times daily with a meal.     metolazone (ZAROXOLYN) 2.5 MG tablet TAKE 1 TABLET TWICE WEEKLY,EVERY MONDAY AND FRIDAY    (CHANGE IN DOSAGE). 26 tablet 3   nitroGLYCERIN (NITROLINGUAL) 0.4 MG/SPRAY spray Place 1 spray under the tongue every 5 (five) minutes x 3 doses as needed for chest pain. 12 g 3   nystatin (MYCOSTATIN/NYSTOP) powder APPLY 1 APPLICATION        TOPICALLY 4 TIMES A DAY AS NEEDED FOR YEAST INFECTIONS 60 g 5   ONE TOUCH ULTRA TEST test strip USE AS DIRECTED FOR TESTING BLOOD GLUCOSE 3 TIMES DAILY  1   pantoprazole (PROTONIX) 40 MG tablet Take 1 tablet (40 mg total) by mouth daily. 90 tablet 3   polyethylene glycol (MIRALAX / GLYCOLAX) 17 g packet Take 17 g by mouth daily.     potassium chloride (KLOR-CON M) 10 MEQ tablet Take 2 tablets (20 mEq total) by mouth 2 (two) times daily. 200 tablet  3   promethazine (PHENERGAN) 12.5 MG tablet Take 1 tablet (12.5 mg total) by mouth every 6 (six) hours as needed for nausea or vomiting. 30 tablet 2   ranolazine (RANEXA) 1000 MG SR tablet TAKE 1 TABLET TWICE A DAY 180 tablet 3   rosuvastatin (CRESTOR) 20 MG tablet Take 1 tablet (  20 mg total) by mouth daily. PLEASE CALL OFFICE FOR FOLLOW UP APPOINTMENT 90 tablet 3   sodium chloride (OCEAN) 0.65 % SOLN nasal spray Place 1 spray into both nostrils as needed for congestion.     spironolactone (ALDACTONE) 25 MG tablet Take 1 tablet (25 mg total) by mouth at bedtime. 90 tablet 3   torsemide (DEMADEX) 20 MG tablet TAKE 3 TABLETS (60MG  TOTAL)TWO TIMES A DAY 540 tablet 3   triamcinolone cream (KENALOG) 0.5 % Apply 1 application topically 3 (three) times daily as needed (rash). To affected areas     VASCEPA 1 g capsule TAKE 2 CAPSULES (2GRAMS    TOTAL) TWO TIMES A DAY 120 capsule 11   VERQUVO 10 MG TABS TAKE 1 TABLET ONCE DAILY 90 tablet 3   No current facility-administered medications for this visit.    SURGICAL HISTORY:  Past Surgical History:  Procedure Laterality Date   ABDOMINAL AORTOGRAM W/LOWER EXTREMITY N/A 04/18/2020   Procedure: ABDOMINAL AORTOGRAM W/LOWER EXTREMITY;  Surgeon: Runell Gess, MD;  Location: [facility];  Service: Cardiovascular;  Laterality: N/A;   APPENDECTOMY     BIOPSY  02/03/2019   Procedure: BIOPSY;  Surgeon: Meryl Dare, MD;  Location: [facility] ENDOSCOPY;  Service: Endoscopy;;   BREAST BIOPSY Left 2016   BRONCHIAL BIOPSY  09/18/2021   Procedure: BRONCHIAL BIOPSIES;  Surgeon: Leslye Peer, MD;  Location: [facility] ENDOSCOPY;  Service: Pulmonary;;   BRONCHIAL BRUSHINGS  09/18/2021   Procedure: BRONCHIAL BRUSHINGS;  Surgeon: Leslye Peer, MD;  Location: [facility] ENDOSCOPY;  Service: Pulmonary;;   BRONCHIAL NEEDLE ASPIRATION BIOPSY  09/18/2021   Procedure: BRONCHIAL NEEDLE ASPIRATION BIOPSIES;  Surgeon: Leslye Peer, MD;  Location: [facility] ENDOSCOPY;  Service: Pulmonary;;    CARDIAC CATHETERIZATION  11/29/05   EF of 55%   CARDIAC CATHETERIZATION  08/06/06   EF of 45-50%   CARDIAC CATHETERIZATION N/A 05/06/2015   Procedure: Right/Left Heart Cath and Coronary/Graft Angiography;  Surgeon: Peter M Swaziland, MD;  Location: [facility];  Service: Cardiovascular;  Laterality: N/A;   CAROTID PTA/STENT INTERVENTION N/A 10/10/2017   Procedure: CAROTID PTA/STENT INTERVENTION - Right;  Surgeon: Nada Libman, MD;  Location: [facility];  Service: Cardiovascular;  Laterality: N/A;   CERVICAL FUSION  1990   CHOLECYSTECTOMY     COLON RESECTION     mass removed and 4 in of colon   COLONOSCOPY WITH PROPOFOL N/A 07/16/2017   Procedure: COLONOSCOPY WITH PROPOFOL;  Surgeon: Meryl Dare, MD;  Location: [facility] ENDOSCOPY;  Service: Endoscopy;  Laterality: N/A;   CORONARY ARTERY BYPASS GRAFT  12/04/2005   x5 -- left internal mammary artery to the LAD, left radial artery to the ramus intermedius, saphenous vein graft to the obtuse marginal 1, sequential saphenous vein grat to the acute marginal and posterior descending, endoscopic vein harvesting from the left thigh with open vein harvest from right leg   CORONARY STENT PLACEMENT  08/11/06   PCI of her ciurcumflex/OM vessel   ESOPHAGOGASTRODUODENOSCOPY (EGD) WITH PROPOFOL N/A 02/03/2019   Procedure: ESOPHAGOGASTRODUODENOSCOPY (EGD) WITH PROPOFOL;  Surgeon: Meryl Dare, MD;  Location: [facility] ENDOSCOPY;  Service: Endoscopy;  Laterality: N/A;   FINE NEEDLE ASPIRATION  09/18/2021   Procedure: FINE NEEDLE ASPIRATION (FNA) LINEAR;  Surgeon: Leslye Peer, MD;  Location: [facility] ENDOSCOPY;  Service: Pulmonary;;   ICD IMPLANT N/A 07/15/2020   Procedure: ICD IMPLANT;  Surgeon: Lanier Prude, MD;  [facility]: [facility];  Service: Cardiovascular;  Laterality: N/A;   IR IMAGING GUIDED PORT INSERTION  10/20/2021   LAPAROTOMY Bilateral 05/19/2015   Procedure: EXPLORATORY LAPAROTOMY WITH BILATERAL SALPINGO OOPHORECTOMY  /OMENTECTOMY/SEGMENTAL SIGMOID COLECTOMY ;  Surgeon: Adolphus Birchwood, MD;  Location: [facility] ORS;  Service: Gynecology;  Laterality: Bilateral;   PERIPHERAL VASCULAR BALLOON ANGIOPLASTY  04/18/2020   Procedure: PERIPHERAL VASCULAR BALLOON ANGIOPLASTY;  Surgeon: Runell Gess, MD;  Location: [facility];  Service: Cardiovascular;;  rt SFA  atherectomy and DCB   PERIPHERAL VASCULAR CATHETERIZATION N/A 11/15/2015   Procedure: Carotid PTA/Stent Intervention;  Surgeon: Runell Gess, MD;  Location: [facility];  Service: Cardiovascular;  Laterality: N/A;   PERIPHERAL VASCULAR CATHETERIZATION  11/15/2015   Procedure: Carotid Angiography;  Surgeon: Runell Gess, MD;  Location: [facility] INVASIVE CV LAB;  Service: Cardiovascular;;   RIGHT HEART CATH N/A 01/27/2020   Procedure: RIGHT HEART CATH;  Surgeon: Laurey Morale, MD;  Location[facility] INVASIVE CV LAB;  Service: Cardiovascular;  Laterality: N/A;   VIDEO BRONCHOSCOPY WITH ENDOBRONCHIAL ULTRASOUND N/A 09/18/2021   Procedure: VIDEO BRONCHOSCOPY WITH ENDOBRONCHIAL ULTRASOUND;  Surgeon: Leslye Peer, MD;  Location: [facility]OS[facility];  Service: Pulmonary;  Laterality: N/A;   VIDEO BRONCHOSCOPY WITH RADIAL ENDOBRONCHIAL ULTRASOUND  09/18/2021   Procedure: RADIAL ENDOBRONCHIAL ULTRASOUND;  Surgeon: Leslye Peer, MD;  Location: [facility] ENDOSCOPY;  Service: Pulmonary;;    REVIEW OF SYSTEMS:  A comprehensive review of systems was negative except for: Constitutional: positive for fatigue Respiratory: positive for dyspnea on exertion   PHYSICAL EXAMINATION: General appearance: alert, cooperative, appears stated age, and no distress Head: Normocephalic, without obvious abnormality, atraumatic Neck: no adenopathy, no JVD, supple, symmetrical, trachea midline, and thyroid not enlarged, symmetric, no tenderness/mass/nodules Lymph nodes: Cervical, supraclavicular, and axillary nodes normal. Resp: clear to auscultation bilaterally Back: symmetric, no curvature. ROM  normal. No CVA tenderness. Cardio: regular rate and rhythm, S1, S2 normal, no murmur, click, rub or gallop GI: soft, non-tender; bowel sounds normal; no masses,  no organomegaly Extremities: extremities normal, atraumatic, no cyanosis or edema  ECOG PERFORMANCE STATUS: 1 - Symptomatic but completely ambulatory  Blood pressure 127/70, pulse 85, temperature 97.6 F (36.4 C), temperature source Oral, resp. rate 16, height 4\' 9"  (1.448 m), weight 117 lb (53.1 kg), last menstrual period 09/02/2003, SpO2 100 %.  LABORATORY DATA: Lab Results  Component Value Date   WBC 5.4 04/08/2023   HGB 14.4 04/08/2023   HCT 42.0 04/08/2023   MCV 88.4 04/08/2023   PLT 219 04/08/2023      Chemistry      Component Value Date/Time   NA 130 (L) 04/08/2023 1133   NA 135 03/26/2023 1310   NA 135 (L) 02/15/2017 1457   K 4.0 04/08/2023 1133   K 4.8 02/15/2017 1457   CL 92 (L) 04/08/2023 1133   CO2 31 04/08/2023 1133   CO2 26 02/15/2017 1457   BUN 24 (H) 04/08/2023 1133   BUN 28 (H) 03/26/2023 1310   BUN 10.8 02/15/2017 1457   CREATININE 0.95 04/08/2023 1133   CREATININE 0.69 05/06/2017 1405   CREATININE 0.7 02/15/2017 1457      Component Value Date/Time   CALCIUM 9.8 04/08/2023 1133   CALCIUM 9.3 02/15/2017 1457   ALKPHOS 66 04/08/2023 1133   ALKPHOS 75 02/15/2017 1457   AST 15 04/08/2023 1133   AST 9 02/15/2017 1457   ALT 11 04/08/2023 1133   ALT 13 02/15/2017 1457   BILITOT 0.4 04/08/2023 1133   BILITOT 0.28 02/15/2017 1457  RADIOGRAPHIC STUDIES: VAS [place] ABI WITH/WO TBI  Result Date: 03/18/2023  LOWER EXTREMITY DOPPLER STUDY Patient Name:  Diane [name]  Date of Exam:   03/18/2023 Medical Rec #: [id number]       Accession #:    [id number] Date of Birth: Jun 24, 1956       Patient Gender: F Patient Age:   69 years Exam Location:  [facility] Procedure:      VAS [place] ABI WITH/WO TBI Referring Phys: Marca Ancona --------------------------------------------------------------------------------   Indications: Peripheral artery disease. Patient states she is too weak to walk              excessively but does use a push walker to help with ambulation. She              indicates she complains of bilateral leg pain when she goes to bed.              She denies rest pain. High Risk Factors: Hypertension, hyperlipidemia, Diabetes, current smoker,                    coronary artery disease, prior CVA. Other Factors: SEE BILATERAL LEG ARTERIAL DUPLEX (AORTOILIAC INCLUDED) REPORT.  Vascular Interventions: 04/18/2020 Right proximal/mid SFA angioplasty. Comparison Study: Prior ABI 11/03/2020 right .93 left .71 Performing Technologist: Carlos American RVT  Examination Guidelines: A complete evaluation includes at minimum, Doppler waveform signals and systolic blood pressure reading at the level of bilateral brachial, anterior tibial, and posterior tibial arteries, when vessel segments are accessible. Bilateral testing is considered an integral part of a complete examination. Photoelectric Plethysmograph (PPG) waveforms and toe systolic pressure readings are included as required and additional duplex testing as needed. Limited examinations for reoccurring indications may be performed as noted.  ABI Findings: +---------+------------------+-----+----------+--------+ Right    Rt Pressure (mmHg)IndexWaveform  Comment  +---------+------------------+-----+----------+--------+ Brachial 129                                       +---------+------------------+-----+----------+--------+ PTA      112               0.81 monophasic         +---------+------------------+-----+----------+--------+ PERO     91                0.66 biphasic           +---------+------------------+-----+----------+--------+ DP       86                0.62 monophasic         +---------+------------------+-----+----------+--------+ Great Toe65                0.47 Normal              +---------+------------------+-----+----------+--------+ +---------+------------------+-----+----------+-------+ Left     Lt Pressure (mmHg)IndexWaveform  Comment +---------+------------------+-----+----------+-------+ Brachial 138                                      +---------+------------------+-----+----------+-------+ PTA      74                0.54 monophasic        +---------+------------------+-----+----------+-------+ PERO     59                0.46 monophasic        +---------+------------------+-----+----------+-------+  DP       65                0.47 monophasic        +---------+------------------+-----+----------+-------+ Great Toe57                0.41 Abnormal          +---------+------------------+-----+----------+-------+ +-------+-----------+-----------+------------+------------+ ABI/TBIToday's ABIToday's TBIPrevious ABIPrevious TBI +-------+-----------+-----------+------------+------------+ Right  .81        .47        .93         .77          +-------+-----------+-----------+------------+------------+ Left   .54        .41        .71         .38          +-------+-----------+-----------+------------+------------+  Bilateral ABIs appear decreased compared to prior study on 11/03/2020.  Summary: Right: Resting right ankle-brachial index indicates mild right lower extremity arterial disease. The right toe-brachial index is abnormal. Left: Resting left ankle-brachial index indicates moderate left lower extremity arterial disease. The left toe-brachial index is abnormal. *See table(s) above for measurements and observations.  Suggest follow up study in 12 months. or if clinically indicated, a vascualr consult is recommended. Electronically signed by Nanetta Batty MD on 03/18/2023 at 5:47:58 PM.    Final    VAS [place] LOWER EXTREMITY ARTERIAL DUPLEX  Result Date: 03/18/2023 LOWER EXTREMITY ARTERIAL DUPLEX STUDY Patient Name:  Diane [name]  Date of  Exam:   03/18/2023 Medical Rec #: [id number]       Accession #:    [id number] Date of Birth: [birth year]       Patient Gender: F Patient Age:   1 years Exam Location:  [facility] Procedure:      VAS [place] LOWER EXTREMITY ARTERIAL DUPLEX Referring Phys: Marca Ancona --------------------------------------------------------------------------------  Indications: Peripheral artery disease, and Patient states she is too weak to              walk excessively but does use a push walker to help with              ambulation. She indicates she complains of bilateral leg pain when              she goes to bed. She denies rest pain. High Risk         Hypertension, hyperlipidemia, Diabetes, current smoker, prior Factors:          CVA. Other Factors: SEE ABI REPORT.  Vascular Interventions: 04/18/2020 right proximal/mid SFA angioplasty. Current ABI:            Right .81 Left .54 Comparison Study: Prior leg arterial duplex exam on 04/18/2020 showed highest                   velocity in right proximal SFA at 304 cm/s.                    01/26/2020 left leg arterial duplex showed highest velocity in                   left mid SFA 237 cm/s. Performing Technologist: Carlos American RVT, RDCS (AE), RDMS  Examination Guidelines: A complete evaluation includes B-mode imaging, spectral Doppler, color Doppler, and power Doppler as needed of all accessible portions of each vessel. Bilateral testing is considered an integral part of a complete examination. Limited examinations for reoccurring indications may be performed  as noted. Aorta: +--------+-------+----------+----------+----------+--------+-------+         AP (cm)Trans (cm)PSV (cm/s)Waveform  ThrombusShape   +--------+-------+----------+----------+----------+--------+-------+ Proximal2.0    2.40      127       monophasic                +--------+-------+----------+----------+----------+--------+-------+ Mid     3.30   3.50      54        biphasic          ectatic  +--------+-------+----------+----------+----------+--------+-------+ Distal  2.20   2.30      30        biphasic                  +--------+-------+----------+----------+----------+--------+-------+   +----------+--------+-----+---------------+--------+---------------+ RIGHT     PSV cm/sRatioStenosis       WaveformComments        +----------+--------+-----+---------------+--------+---------------+ CIA Prox  161                         biphasic                +----------+--------+-----+---------------+--------+---------------+ CIA Distal155                         biphasic                +----------+--------+-----+---------------+--------+---------------+ EIA Mid   206                         biphasic                +----------+--------+-----+---------------+--------+---------------+ EIA Distal157                         biphasic                +----------+--------+-----+---------------+--------+---------------+ CFA Prox  146                         biphasic                +----------+--------+-----+---------------+--------+---------------+ CFA Distal218          30-49% stenosisbiphasic> 50 % stenosis +----------+--------+-----+---------------+--------+---------------+ DFA       37                          biphasic                +----------+--------+-----+---------------+--------+---------------+ SFA Prox  234          30-49% stenosisbiphasic                +----------+--------+-----+---------------+--------+---------------+ SFA Mid   119                         biphasic                +----------+--------+-----+---------------+--------+---------------+ SFA Distal110                         biphasic                +----------+--------+-----+---------------+--------+---------------+ POP Prox  52                          biphasic                +----------+--------+-----+---------------+--------+---------------+  POP Distal72                           biphasic                +----------+--------+-----+---------------+--------+---------------+ TP Trunk  38                          biphasic                +----------+--------+-----+---------------+--------+---------------+ A focal velocity elevation of 206 cm/s was obtained at DST CFA with a VR of 1.4. A 2nd focal velocity elevation was visualized, measuring 234 cm/s at PRX SFA with a VR of 1.6.  +-----------+--------+-----+--------+----------+-------------------------------+ LEFT       PSV cm/sRatioStenosisWaveform  Comments                        +-----------+--------+-----+--------+----------+-------------------------------+ CIA Prox   117                  biphasic                                  +-----------+--------+-----+--------+----------+-------------------------------+ CIA Mid    157                  biphasic                                  +-----------+--------+-----+--------+----------+-------------------------------+ CIA Distal 113                  biphasic                                  +-----------+--------+-----+--------+----------+-------------------------------+ EIA Prox   276                  biphasic  > 50 % stenosis                 +-----------+--------+-----+--------+----------+-------------------------------+ EIA Mid    276                  biphasic  > 50 % stenosis                 +-----------+--------+-----+--------+----------+-------------------------------+ EIA Distal 211                  biphasic  > 50 % stenosis                 +-----------+--------+-----+--------+----------+-------------------------------+ CFA Prox   184                  monophasic                                +-----------+--------+-----+--------+----------+-------------------------------+ CFA Distal 73                   monophasic                                 +-----------+--------+-----+--------+----------+-------------------------------+ DFA        235  monophasic                                +-----------+--------+-----+--------+----------+-------------------------------+ SFA Prox                occluded          recannulated just distal to                                               occlusion                       +-----------+--------+-----+--------+----------+-------------------------------+ SFA Mid    48                   monophasic                                +-----------+--------+-----+--------+----------+-------------------------------+ SFA Distal 77                   monophasiccalcific shadowing plaque; can                                            not rule out high grade                                                   stenosis versus occlusion                                                 within                          +-----------+--------+-----+--------+----------+-------------------------------+ POP Prox   39                   monophasic                                +-----------+--------+-----+--------+----------+-------------------------------+ POP Distal 30                   monophasic                                +-----------+--------+-----+--------+----------+-------------------------------+ TP Trunk   59                   monophasic                                +-----------+--------+-----+--------+----------+-------------------------------+ ATA Prox   37                   monophasic                                +-----------+--------+-----+--------+----------+-------------------------------+  ATA Distal 26                   monophasic                                +-----------+--------+-----+--------+----------+-------------------------------+ PTA Prox   23                   monophasic                                 +-----------+--------+-----+--------+----------+-------------------------------+ PTA Distal 30                   monophasic                                +-----------+--------+-----+--------+----------+-------------------------------+ PERO Prox  15                   monophasic                                +-----------+--------+-----+--------+----------+-------------------------------+ PERO Distal21                   monophasic                                +-----------+--------+-----+--------+----------+-------------------------------+  Summary: Right: 30-49% stenosis noted in the common femoral artery. 30-49% stenosis noted in the superficial femoral artery. Moderate calcified plaque seen throughout extremity. Left: Total occlusion noted in the superficial femoral artery. Moderate to severe calcified, shadowing palque seen throughout extremity. > 50 % stenosis in external iliac artery.  See table(s) above for measurements and observations. Suggest follow up study in 12 months or if clinically indicated, a vascular consult is recommended. Patient declined setting up appointment today and would like to refer back to provider for guidance. Electronically signed by Nanetta Batty MD on 03/18/2023 at 5:47:01 PM.    Final     ASSESSMENT AND PLAN: This is a very pleasant 67 years old white female recently diagnosed with Limited stage (T3, N2, M0) small cell lung cancer presented with right hilar and mediastinal lymphadenopathy. There was no hypermetabolism in the left adrenal gland lesion. Diagnosed in December 2022. She initially started treatment with carboplatin, etoposide, Cosela and Imfinzi with the expectation that she had extensive stage disease status post 1 cycle. Staging work-up with PET scan showed no hypermetabolic activity and the suspicious left adrenal gland lesion. Her treatment was switched to chemotherapy with with carboplatin for AUC of 5 on day 1, etoposide 100 Mg/M2 on  days 1, 2 and 3 every 3 weeks concurrent with radiation.  First dose today 11/06/2021.  Status post 3 cycles. She tolerated her treatment well with no concerning adverse effect except for fatigue as well as radiation-induced esophagitis with dysphagia and odynophagia. The patient declined prophylactic cranial irradiation. The patient is currently on observation and she is feeling fine with no concerning complaints except for the mild fatigue and shortness of breath with exertion. She had repeat CT scan of the chest performed recently.  The final report is still pending.  I personally and independently reviewed the scan images in comparison to the previous scans 3 months ago and  I do not see any concerning findings for disease recurrence or metastasis but I will wait for the final report for confirmation. I recommended for the patient to continue on observation with repeat CT scan of the chest in 4 months. For the history of iron deficiency anemia, I recommended for her to resume her treatment with iron tablet every other day. The patient was advised to call immediately if she has any concerning symptoms in the interval. The patient voices understanding of current disease status and treatment options and is in agreement with the current care plan.  All questions were answered. The patient knows to call the clinic with any problems, questions or concerns. We can certainly see the patient much sooner if necessary. The total time spent in the appointment was 20 minutes.  Disclaimer: This note was dictated with voice recognition software. Similar sounding words can inadvertently be transcribed and may not be corrected upon review.

## 2023-04-10 ENCOUNTER — Other Ambulatory Visit: Payer: Medicare Other

## 2023-04-10 ENCOUNTER — Ambulatory Visit: Payer: Medicare Other | Admitting: Internal Medicine

## 2023-04-16 ENCOUNTER — Encounter: Payer: Self-pay | Admitting: Physician Assistant

## 2023-04-23 ENCOUNTER — Ambulatory Visit (INDEPENDENT_AMBULATORY_CARE_PROVIDER_SITE_OTHER): Payer: Medicare Other

## 2023-04-23 DIAGNOSIS — I255 Ischemic cardiomyopathy: Secondary | ICD-10-CM | POA: Diagnosis not present

## 2023-04-24 LAB — CUP PACEART REMOTE DEVICE CHECK
Battery Remaining Longevity: 150 mo
Battery Remaining Percentage: 100 %
Brady Statistic RV Percent Paced: 0 %
Date Time Interrogation Session: 20240723041100
HighPow Impedance: 90 Ohm
Implantable Lead Connection Status: 753985
Implantable Lead Implant Date: 20211015
Implantable Lead Location: 753860
Implantable Lead Model: 672
Implantable Lead Serial Number: 166771
Implantable Pulse Generator Implant Date: 20211015
Lead Channel Impedance Value: 1089 Ohm
Lead Channel Setting Pacing Amplitude: 3.5 V
Lead Channel Setting Pacing Pulse Width: 0.4 ms
Lead Channel Setting Sensing Sensitivity: 0.5 mV
Pulse Gen Serial Number: 279742
Zone Setting Status: 755011

## 2023-04-30 ENCOUNTER — Other Ambulatory Visit (HOSPITAL_COMMUNITY): Payer: Self-pay

## 2023-04-30 MED ORDER — ICOSAPENT ETHYL 1 G PO CAPS: 2.0000 g | ORAL_CAPSULE | Freq: Two times a day (BID) | ORAL | 3 refills | Status: DC

## 2023-04-30 MED ORDER — ROSUVASTATIN CALCIUM 20 MG PO TABS: 20.0000 mg | ORAL_TABLET | Freq: Every day | ORAL | 3 refills | Status: DC

## 2023-04-30 MED ORDER — EZETIMIBE 10 MG PO TABS: 10.0000 mg | ORAL_TABLET | Freq: Every day | ORAL | 3 refills | Status: DC

## 2023-04-30 NOTE — Telephone Encounter (Signed)
Meds ordered this encounter  Medications   icosapent Ethyl (VASCEPA) 1 g capsule    Sig: Take 2 capsules (2 g total) by mouth 2 (two) times daily.    Dispense:  360 capsule    Refill:  3   ezetimibe (ZETIA) 10 MG tablet    Sig: Take 1 tablet (10 mg total) by mouth daily. PLEASE CALL OFFICE FOR FOLLOW UP APPOINTMENT    Dispense:  90 tablet    Refill:  3   rosuvastatin (CRESTOR) 20 MG tablet    Sig: Take 1 tablet (20 mg total) by mouth daily.    Dispense:  90 tablet    Refill:  3

## 2023-05-03 ENCOUNTER — Other Ambulatory Visit: Payer: Medicare Other

## 2023-05-09 NOTE — Progress Notes (Signed)
Remote ICD transmission.   

## 2023-05-17 ENCOUNTER — Encounter: Payer: Self-pay | Admitting: Physician Assistant

## 2023-05-20 ENCOUNTER — Encounter: Payer: Self-pay | Admitting: Cardiology

## 2023-05-20 ENCOUNTER — Encounter: Payer: Self-pay | Admitting: Physician Assistant

## 2023-05-20 ENCOUNTER — Encounter: Payer: Medicare Other | Admitting: Student

## 2023-05-20 ENCOUNTER — Ambulatory Visit (HOSPITAL_COMMUNITY): Admission: RE | Admit: 2023-05-20 | Payer: Medicare Other | Source: Ambulatory Visit | Admitting: Cardiology

## 2023-05-20 ENCOUNTER — Encounter (HOSPITAL_COMMUNITY): Payer: Self-pay | Admitting: Cardiology

## 2023-05-20 ENCOUNTER — Other Ambulatory Visit (HOSPITAL_COMMUNITY): Payer: Self-pay

## 2023-05-20 VITALS — BP 110/70 | HR 90 | Wt 115.6 lb

## 2023-05-20 DIAGNOSIS — F17210 Nicotine dependence, cigarettes, uncomplicated: Secondary | ICD-10-CM | POA: Diagnosis not present

## 2023-05-20 DIAGNOSIS — I110 Hypertensive heart disease with heart failure: Secondary | ICD-10-CM | POA: Insufficient documentation

## 2023-05-20 DIAGNOSIS — I739 Peripheral vascular disease, unspecified: Secondary | ICD-10-CM | POA: Diagnosis not present

## 2023-05-20 DIAGNOSIS — Z955 Presence of coronary angioplasty implant and graft: Secondary | ICD-10-CM | POA: Diagnosis not present

## 2023-05-20 DIAGNOSIS — Z951 Presence of aortocoronary bypass graft: Secondary | ICD-10-CM | POA: Diagnosis not present

## 2023-05-20 DIAGNOSIS — Z7982 Long term (current) use of aspirin: Secondary | ICD-10-CM | POA: Diagnosis not present

## 2023-05-20 DIAGNOSIS — Z923 Personal history of irradiation: Secondary | ICD-10-CM | POA: Diagnosis not present

## 2023-05-20 DIAGNOSIS — I951 Orthostatic hypotension: Secondary | ICD-10-CM | POA: Diagnosis not present

## 2023-05-20 DIAGNOSIS — I493 Ventricular premature depolarization: Secondary | ICD-10-CM | POA: Diagnosis not present

## 2023-05-20 DIAGNOSIS — Z9221 Personal history of antineoplastic chemotherapy: Secondary | ICD-10-CM | POA: Diagnosis not present

## 2023-05-20 DIAGNOSIS — I6521 Occlusion and stenosis of right carotid artery: Secondary | ICD-10-CM | POA: Diagnosis not present

## 2023-05-20 DIAGNOSIS — Z7902 Long term (current) use of antithrombotics/antiplatelets: Secondary | ICD-10-CM | POA: Insufficient documentation

## 2023-05-20 DIAGNOSIS — Z7984 Long term (current) use of oral hypoglycemic drugs: Secondary | ICD-10-CM | POA: Diagnosis not present

## 2023-05-20 DIAGNOSIS — Z95820 Peripheral vascular angioplasty status with implants and grafts: Secondary | ICD-10-CM | POA: Insufficient documentation

## 2023-05-20 DIAGNOSIS — Z85118 Personal history of other malignant neoplasm of bronchus and lung: Secondary | ICD-10-CM | POA: Insufficient documentation

## 2023-05-20 DIAGNOSIS — I2510 Atherosclerotic heart disease of native coronary artery without angina pectoris: Secondary | ICD-10-CM | POA: Diagnosis not present

## 2023-05-20 DIAGNOSIS — D509 Iron deficiency anemia, unspecified: Secondary | ICD-10-CM | POA: Insufficient documentation

## 2023-05-20 DIAGNOSIS — Z79899 Other long term (current) drug therapy: Secondary | ICD-10-CM | POA: Diagnosis not present

## 2023-05-20 DIAGNOSIS — Z95810 Presence of automatic (implantable) cardiac defibrillator: Secondary | ICD-10-CM | POA: Insufficient documentation

## 2023-05-20 DIAGNOSIS — Z8619 Personal history of other infectious and parasitic diseases: Secondary | ICD-10-CM | POA: Diagnosis not present

## 2023-05-20 DIAGNOSIS — Z9181 History of falling: Secondary | ICD-10-CM | POA: Insufficient documentation

## 2023-05-20 DIAGNOSIS — I5022 Chronic systolic (congestive) heart failure: Secondary | ICD-10-CM | POA: Diagnosis not present

## 2023-05-20 DIAGNOSIS — I255 Ischemic cardiomyopathy: Secondary | ICD-10-CM | POA: Diagnosis not present

## 2023-05-20 LAB — CBC
HCT: 45 % (ref 36.0–46.0)
Hemoglobin: 15.1 g/dL — ABNORMAL HIGH (ref 12.0–15.0)
MCH: 30 pg (ref 26.0–34.0)
MCHC: 33.6 g/dL (ref 30.0–36.0)
MCV: 89.5 fL (ref 80.0–100.0)
Platelets: 252 10*3/uL (ref 150–400)
RBC: 5.03 MIL/uL (ref 3.87–5.11)
RDW: 14.6 % (ref 11.5–15.5)
WBC: 7 10*3/uL (ref 4.0–10.5)
nRBC: 0 % (ref 0.0–0.2)

## 2023-05-20 LAB — DIGOXIN LEVEL: Digoxin Level: 0.7 ng/mL — ABNORMAL LOW (ref 0.8–2.0)

## 2023-05-20 LAB — LIPID PANEL
Cholesterol: 122 mg/dL (ref 0–200)
HDL: 40 mg/dL — ABNORMAL LOW (ref >40)
LDL Cholesterol: 53 mg/dL (ref 0–99)
Total CHOL/HDL Ratio: 3.1 RATIO
Triglycerides: 147 mg/dL (ref <150)
VLDL: 29 mg/dL (ref 0–40)

## 2023-05-20 LAB — BASIC METABOLIC PANEL
Anion gap: 12 (ref 5–15)
BUN: 20 mg/dL (ref 8–23)
CO2: 29 mmol/L (ref 22–32)
Calcium: 9.5 mg/dL (ref 8.9–10.3)
Chloride: 90 mmol/L — ABNORMAL LOW (ref 98–111)
Creatinine, Ser: 0.88 mg/dL (ref 0.44–1.00)
GFR, Estimated: >60 mL/min (ref >60)
Glucose, Bld: 138 mg/dL — ABNORMAL HIGH (ref 70–99)
Potassium: 4.3 mmol/L (ref 3.5–5.1)
Sodium: 131 mmol/L — ABNORMAL LOW (ref 135–145)

## 2023-05-20 LAB — BRAIN NATRIURETIC PEPTIDE: B Natriuretic Peptide: 98.1 pg/mL (ref 0.0–100.0)

## 2023-05-20 LAB — FERRITIN: Ferritin: 10 ng/mL — ABNORMAL LOW (ref 11–307)

## 2023-05-20 LAB — IRON AND TIBC
Iron: 100 ug/dL (ref 28–170)
Saturation Ratios: 15 % (ref 10.4–31.8)
TIBC: 648 ug/dL — ABNORMAL HIGH (ref 250–450)
UIBC: 548 ug/dL

## 2023-05-20 MED ORDER — MEXILETINE HCL 150 MG PO CAPS: 150.0000 mg | ORAL_CAPSULE | Freq: Two times a day (BID) | ORAL | 3 refills | Status: DC

## 2023-05-20 MED ORDER — TORSEMIDE 20 MG PO TABS: ORAL_TABLET | ORAL | 3 refills | Status: DC

## 2023-05-20 NOTE — Telephone Encounter (Signed)
Error

## 2023-05-20 NOTE — Patient Instructions (Signed)
CHANGE Torsemide to 80 mg ( 4 tab) Twice daily for 2 days, then change to 80 mg in the morning ( 4 Tab) and 60 mg ( 3 tab) in the evening.  START Mexiletine 150 mg Twice daily  Labs done today, your results will be available in MyChart, we will contact you for abnormal readings.  Repeat blood work in 10 days.  Your physician has requested that you have an echocardiogram. Echocardiography is a painless test that uses sound waves to create images of your heart. It provides your doctor with information about the size and shape of your heart and how well your heart's chambers and valves are working. This procedure takes approximately one hour. There are no restrictions for this procedure. Please do NOT wear cologne, perfume, aftershave, or lotions (deodorant is allowed). Please arrive 15 minutes prior to your appointment time.  Your provider has ordered carotid dopplers for you. They will call you to arrange your appointment.  Your physician recommends that you schedule a follow-up appointment in: 3 months  If you have any questions or concerns before your next appointment please send [place] a message through [place] or call our office at [phone].    TO LEAVE A MESSAGE FOR THE NURSE SELECT OPTION 2, PLEASE LEAVE A MESSAGE INCLUDING: YOUR NAME DATE OF BIRTH CALL BACK NUMBER REASON FOR CALL**this is important as we prioritize the call backs  YOU WILL RECEIVE A CALL BACK THE SAME DAY AS LONG AS YOU CALL BEFORE 4:00 PM  At the [facility], you and your health needs are our priority. As part of our continuing mission to provide you with exceptional heart care, we have created designated Provider Care Teams. These Care Teams include your primary Cardiologist (physician) and Advanced Practice Providers (APPs- Physician Assistants and Nurse Practitioners) who all work together to provide you with the care you need, when you need it.   You may see any of the following providers on  your designated Care Team at your next follow up: Dr Arvilla Meres Dr Marca Ancona Dr. Marcos Eke, NP Robbie Lis, [place] [facility] [place], [place] Brynda Peon, NP Karle Plumber, PharmD   Please be sure to bring in all your medications bottles to every appointment.    Thank you for choosing [facility]

## 2023-05-20 NOTE — Progress Notes (Signed)
PCP: Judy Pimple, MD Cardiology: Dr. Swaziland HF Cardiology: Dr. Shirlee Latch  67 y.o. with history of CAD, ischemic cardiomyopathy, CVA, and valvular disease was referred by Dr. Swaziland for CHF evaluation.  Patient has an extensive history of vascular disease.  She had CABG in 2007.  Repeat LHC in 2007 showed all grafts closed except LIMA-LAD.  Patient had DES to LCx/OM.  LHC 2016 showed occlusion of DES to LCx/OM with L-L and L-R collaterals. Poor targets for redo CABG.   In 2/21, echo was done showing EF 20-25% with moderate RV dysfunction, moderate-severe MR, moderate TR.  Despite smoking history, PFTs in 2019 looked relatively normal. She additionally had the placement of a carotid stent on the right in 1/19.    RHC was done in 4/21, showing near-normal filling pressures with CI 2.12 by Fick.  CPX in 5/21 showed peak VO2 13, VE/VCO2 slope 37 => moderate HF limitation.  The PFTs on the CPX were not markedly abnormal. She saw Dr. Allyson Sabal recently, peripheral arterial dopplers in 4/21 showed significant stenosis right SFA.   We worked her up for LVAD.  She was presented at [facility] and saw Dr. Donata Clay.  He was concerned by her small stature and small LV cavity. Concern that Heartmate 3 would not function well in her small LV, and Heartware LVAD that was thought to be more suitable for small patients has been discontinued.   We considered her for a barostimulation trial, but she does not qualify for either ANTHEM or Batwire due to carotid disease.   She is off dapagliflozin due to recurrrent GU yeast infections.  In 7/21, she had atherectomy of right SFA.  [employer] ICD placed in 10/21.   As she was turned down for LVAD at [facility], Dr. Shirlee Latch had her seen at [facility]. She underwent workup there with echo in 11/21 showing higher EF 35%. RHC, however, showed low CI 1.8.    Echo in 5/22 showed improvement in EF, now up to 45-50% with basal-mid inferolateral and anterolateral akinesis, normal RV.    Underwent bronchoscopy 12/19 for pulmonary nodules, suspicious for malignancy.  Follow up 12/22, NYHA II-III symptoms, mildly volume overloaded and torsemide increased to 60 bid.  Unfortunately diagnosed with small cell lung cancer 12/22 and has had treatment with chemo and radiation.   Echo in 11/23 with EF 40%, basal -mid inferior AK and inferolateral/anterolateral HK, RV normal, mild-moderate MR.   Patient had ICD shock in 4/23 due to VT, no prior chest pain or dyspnea.  Decision made to start amiodarone. She developed imbalance and had multiple falls after starting amiodarone, now off.   Today she returns for HF follow up. No further ICD discharges.  No lightheadedness.  Balance improved off amiodarone, no more falls.  She has chronic orthopnea, sleeps with head of bed raised and 2 pillows.  Legs feel weak bilaterally, she has occasional leg pain but not clearly exertional. Has general fatigue.  Short of breath walking longer distances, generally ok around house and shorter distances like walking into the office today.  No chest pain.  Still smoking but < 1 ppd.   ECG (personally reviewed): NSR, poor RWP, inferior Qs, QTc 445 msec  [employer] device interrogation: Heartlogic score 18               Labs (4/21): K 3.7 => 3.4, creatinine 0.78 => 0.7, hgb 11.3 => 14.4 Labs (5/21): digoxin 0.6, K 3.5, creatinine 0.63 Labs (6/21): K 4.1, creatinine 0.7,  LDL 61, TGs 247 Labs (7/21): K 5, creatinine 0.76, hgb 16.9.  Labs (10/21): K 4.6, creatinine 0.86 Labs (11/21): digoxin 0.9, LDL 45, HDL 27, K 4, creatinine 0.86 Labs (4/22): hgb 14.4, K 4.9, creatinine 0.81, digoxin 0.7 Labs (9/22): hgb 14.5, K 4.9, creatinine 0.8, LDL 38 Labs (4/23): K 4.3, creatinine 0.56  Labs (7/23): hgb 11.1, K 4, creatinine 0.87 Labs (9/23): LDL 34, HDL 33, TGs 186 Labs (10/23): K 3.7, creatinine 0.70 Labs (1/24): LDL 30, TGs 186 Labs (5/24): K 3.8, creatinine 1.12, TSH normal, Mg 1.7, hgb 14.9, LFTs  normal Labs (7/24): K 4, creatinine 0.95  PMH: 1. CAD: CABG 2007.  - LHC in 2007 showed all grafts closed except LIMA-LAD.  Patient had DES to LCx/OM.   - LHC 2016 showed occlusion of DES to LCx/OM with L-L and L-R collaterals.  - Not candidate for redo CABG with poor targets.  2. Chronic systolic CHF: Ischemic cardiomyopathy.   - Echo (2/21): EF 20-25%, moderately dilated and moderately dysfunctional RV, PASP 49, severe biatrial enlargement, moderate-severe MR, severe TR, mild-moderate AS, IVC dilated.  - RHC (4/21): mean RA 6, PA 41/16, mean 29, mean PCWP 15, CI 2.12, PVR 4.3 WU - CPX (5/21): peak VO2 13, VE/VCO2 slope 37, RER 1.06 => moderate HF limitation.  - Echo (11/21): EF 35%, mild LVH.  - RHC (11/21): baseline CI 1.8, exercise peak VO2 12.2, PCWP up to 24 with exercise.  - Echo (5/22): EF 45-50%, basal-mid inferolateral and anterolateral akinesis, normal RV.  - Echo (11/23): EF 40%, basal -mid inferior AK and inferolateral/anterolateral HK, RV normal, mild-moderate MR.  3. Fe deficiency anemia: FOBT negative.  4. Carotid stenosis: Right carotid stent in 1/19.  - Carotid dopplers (7/20): 60-79% LICA stenosis.  - Carotid dopplers (6/21): 60-79% LICA stenosis.  - Carotid dopplers (6/22): 60-79% LICA stenosis.  - Carotid dopplers (10/23): 60-79% LICA stenosis, patent RICA stent.  5. H/o CVA 6. HTN 7. Type 2 diabetes 8. Hyperlipidemia 9. Seizure disorder 10. OSA: Cannot tolerate CPAP.  11. Active smoker: PFTs in 2019 were relatively normal.  She does use oxygen at night.  - PFTs (5/21): FEV1 82%, FVC 80%, ratio 102% => minimal obstruction.  12. PAD: ABIs (4/21) 0.76 on right, 0.86 on left => significant right SFA stenosis.  - Atherectomy/angioplasty right SFA.  - Peripheral arterial dopplers (2/22): 50-74% stenosis right SFA.  - Peripheral arterial dopplers (6/24): 30-49% right CFA and SFA, occluded left SFA.  13. Chronic penetrating atherosclerotic ulcer descending thoracic  aorta.  14. Small cell lung cancer: Diagnosed 12/22. Treated with carboplatin and etoposide, and radiation therapy. 15. Esophageal stricture with dilation 16. VT: 4/23 with ICD discharge.  - Did not tolerate amiodarone due to imbalance  Social History   Socioeconomic History   Marital status: Divorced    Spouse name: Not on file   Number of children: 0   Years of education: Not on file   Highest education level: Not on file  Occupational History   Occupation: disabled  Tobacco Use   Smoking status: Every Day    Current packs/day: 0.00    Average packs/day: 0.5 packs/day for 54.4 years (27.2 ttl pk-yrs)    Types: Cigarettes    Start date: 21    Last attempt to quit: 02/22/2020    Years since quitting: 3.2   Smokeless tobacco: Never   Tobacco comments:    Pt smokes a 1 pack a day 08/30/2021 / pl  Vaping Use  Vaping status: Former  Substance and Sexual Activity   Alcohol use: No   Drug use: No   Sexual activity: Not Currently    Partners: Male    Birth control/protection: None  Other Topics Concern   Not on file  Social History Narrative   Right handed   Lives alone in a one story home   Social Determinants of Health   Financial Resource Strain: Low Risk  (12/31/2022)   Overall Financial Resource Strain (CARDIA)    Difficulty of Paying Living Expenses: Not very hard  Food Insecurity: No Food Insecurity (12/31/2022)   Hunger Vital Sign    Worried About Running Out of Food in the Last Year: Never true    Ran Out of Food in the Last Year: Never true  Transportation Needs: No Transportation Needs (12/31/2022)   PRAPARE - Administrator, Civil Service (Medical): No    Lack of Transportation (Non-Medical): No  Physical Activity: Inactive (12/31/2022)   Exercise Vital Sign    Days of Exercise per Week: 0 days    Minutes of Exercise per Session: 0 min  Stress: No Stress Concern Present (12/31/2022)   [employer] of Occupational Health - Occupational Stress  Questionnaire    Feeling of Stress : Not at all  Social Connections: Socially Isolated (12/31/2022)   Social Connection and Isolation Panel [NHANES]    Frequency of Communication with Friends and Family: More than three times a week    Frequency of Social Gatherings with Friends and Family: Twice a week    Attends Religious Services: Never    Database administrator or Organizations: No    Attends Banker Meetings: Never    Marital Status: Divorced  Catering manager Violence: Not At Risk (12/31/2022)   Humiliation, Afraid, Rape, and Kick questionnaire    Fear of Current or Ex-Partner: No    Emotionally Abused: No    Physically Abused: No    Sexually Abused: No   Family History  Problem Relation Age of Onset   Heart disease Mother    Diabetes Mother    COPD Mother    Hyperlipidemia Mother    Hypertension Mother    Cancer Father        met, origin unknown   Heart attack Father    Hypertension Sister    Cancer Sister        eyelid   Glaucoma Sister    Heart disease Maternal Grandfather    Drug abuse Paternal Grandmother    Stroke Paternal Grandfather    Heart disease Maternal Aunt        x 2 aunts   Lung cancer Paternal Aunt        lung with mets to brain   Melanoma Paternal Uncle    Lung cancer Paternal Uncle        lung/liver to brain   Cancer Paternal Uncle        cancer of unknown type   Colon cancer Neg Hx    Stomach cancer Neg Hx    Esophageal cancer Neg Hx    ROS: All systems reviewed and negative except as per HPI.   Current Outpatient Medications  Medication Sig Dispense Refill   acetaminophen (TYLENOL) 500 MG tablet Take 1,000 mg by mouth at bedtime.     albuterol (VENTOLIN HFA) 108 (90 Base) MCG/ACT inhaler INHALE 2 PUFFS EVERY 6 HOURS AS NEEDED FOR WHEEZING/SHORTNESS OF BREATH 18 each 0   aspirin EC 81 MG  tablet Take 81 mg by mouth every evening.  30 tablet 6   BD PEN NEEDLE NANO 2ND GEN 32G X 4 MM MISC Inject into the skin 3 (three) times daily.      clopidogrel (PLAVIX) 75 MG tablet Take 1 tablet (75 mg total) by mouth daily. 90 tablet 3   digoxin (LANOXIN) 0.125 MG tablet Take 0.5 tablets (62.5 mcg total) by mouth daily. 45 tablet 3   ezetimibe (ZETIA) 10 MG tablet Take 1 tablet (10 mg total) by mouth daily. PLEASE CALL OFFICE FOR FOLLOW UP APPOINTMENT 90 tablet 3   fluticasone (FLONASE) 50 MCG/ACT nasal spray Place 2 sprays into both nostrils daily. 48 g 3   gabapentin (NEURONTIN) 300 MG capsule TAKE 2 CAPSULES DAILY AT   BEDTIME 180 capsule 3   glucose blood (ONETOUCH ULTRA) test strip 3 (three) times daily.     guaiFENesin (MUCINEX) 600 MG 12 hr tablet Take 2 tablets (1,200 mg total) by mouth 2 (two) times daily. 360 tablet 3   HYDROcodone-acetaminophen (NORCO) 5-325 MG tablet Take 1 tablet by mouth every 6 (six) hours as needed for moderate pain. 30 tablet 0   icosapent Ethyl (VASCEPA) 1 g capsule Take 1 g by mouth 2 (two) times daily.     insulin aspart protamine- aspart (NOVOLOG MIX 70/30) (70-30) 100 UNIT/ML injection Inject 55 Units into the skin 2 (two) times daily with a meal. 5 units with lunch     levETIRAcetam (KEPPRA) 1000 MG tablet Take 1 tablet (1,000 mg total) by mouth 2 (two) times daily. 180 tablet 3   Magnesium 400 MG TABS Take 400 mg by mouth 2 (two) times daily. 180 tablet 3   metFORMIN (GLUCOPHAGE) 500 MG tablet Take 500 mg by mouth 2 (two) times daily with a meal.     metolazone (ZAROXOLYN) 2.5 MG tablet TAKE 1 TABLET TWICE WEEKLY,EVERY MONDAY AND FRIDAY    (CHANGE IN DOSAGE). 26 tablet 3   mexiletine (MEXITIL) 150 MG capsule Take 1 capsule (150 mg total) by mouth 2 (two) times daily. 180 capsule 3   nitroGLYCERIN (NITROLINGUAL) 0.4 MG/SPRAY spray Place 1 spray under the tongue every 5 (five) minutes x 3 doses as needed for chest pain. 12 g 3   nystatin (MYCOSTATIN/NYSTOP) powder APPLY 1 APPLICATION        TOPICALLY 4 TIMES A DAY AS NEEDED FOR YEAST INFECTIONS 60 g 5   ONE TOUCH ULTRA TEST test strip USE AS  DIRECTED FOR TESTING BLOOD GLUCOSE 3 TIMES DAILY  1   pantoprazole (PROTONIX) 40 MG tablet Take 1 tablet (40 mg total) by mouth daily. 90 tablet 3   polyethylene glycol (MIRALAX / GLYCOLAX) 17 g packet Take 17 g by mouth daily.     potassium chloride (MICRO-K) 10 MEQ CR capsule Take 10 mEq by mouth 2 (two) times daily.     promethazine (PHENERGAN) 12.5 MG tablet Take 1 tablet (12.5 mg total) by mouth every 6 (six) hours as needed for nausea or vomiting. 30 tablet 2   ranolazine (RANEXA) 1000 MG SR tablet TAKE 1 TABLET TWICE A DAY 180 tablet 3   rosuvastatin (CRESTOR) 20 MG tablet Take 1 tablet (20 mg total) by mouth daily. 90 tablet 3   sodium chloride (OCEAN) 0.65 % SOLN nasal spray Place 1 spray into both nostrils as needed for congestion.     spironolactone (ALDACTONE) 25 MG tablet Take 1 tablet (25 mg total) by mouth at bedtime. 90 tablet 3   triamcinolone cream (  KENALOG) 0.5 % Apply 1 application topically 3 (three) times daily as needed (rash). To affected areas     VERQUVO 10 MG TABS TAKE 1 TABLET ONCE DAILY 90 tablet 3   torsemide (DEMADEX) 20 MG tablet Take 4 tablets (80 mg total) by mouth every morning AND 3 tablets (60 mg total) every evening. 540 tablet 3   No current facility-administered medications for this encounter.   Wt Readings from Last 3 Encounters:  05/20/23 52.4 kg (115 lb 9.6 oz)  04/09/23 53.1 kg (117 lb)  03/26/23 54.7 kg (120 lb 9.6 oz)   BP 110/70   Pulse 90   Wt 52.4 kg (115 lb 9.6 oz)   LMP 09/02/2003   SpO2 96%   BMI 25.02 kg/m  General: NAD Neck: JVP 8 cm with HJR, no thyromegaly or thyroid nodule.  Lungs: Mildly distant BS CV: Nondisplaced PMI.  Heart regular S1/S2, no S3/S4, no murmur.  No peripheral edema.  No carotid bruit.  Difficult to palpate pedal pulses.  Abdomen: Soft, nontender, no hepatosplenomegaly, no distention.  Skin: Intact without lesions or rashes.  Neurologic: Alert and oriented x 3.  Psych: Normal affect. Extremities: No clubbing  or cyanosis.  HEENT: Normal.   Assessment/Plan:  1. Chronic systolic CHF: Ischemic cardiomyopathy, [employer] ICD.  Echo in 2/21 showed EF 20-25%, moderately dilated and moderately dysfunctional RV, PASP 49, severe biatrial enlargement, moderate-severe MR, severe TR, mild-moderate AS, IVC dilated. CPX (5/21) with moderate HF limitation, RHC (4/21) with CI low at 2.12.  Medication titration has been severely limited by hypotension and orthostatic symptoms.  Not a transplant candidate due to smoking. She was seen by cardiac surgery at [facility] and not thought to be good LVAD candidate due to small stature, small LV cavity, and redo sternotomy.  I sent her for a 2nd opinion at [facility].  She had echo there in 11/21 with EF higher at 35% but CI low on RHC at 1.8, no LVAD planned based on higher EF.  Echo in 5/22 showed EF 45-50%.  Echo in 11/23 with EF 40%, normal RV.  NYHA III symptoms chronically, prominent fatigue.  She is mildly volume overloaded on exam and HeartLogic is elevated at 18.  - Increase torsemide to 80 mg bid x 2 days then 80 qam/60 qpm.  BMET/BNP today, BMET 10 days.  - Continue vericiguat 10 mg daily.  - Continue metolazone 2.5 mg every Monday and Friday. - Continue spironolactone 25 mg daily.  - Continue digoxin 0.0625 mg daily. Check digoxin level today.  - She has not tolerated BP- active meds well, avoid Coreg and Entresto for now.  She became hypotensive with losartan.  - She did not tolerate SGLT2 inhibitor due to recurrent GU yeast infections.  - She has had hypotension with beta blockers but may try low dose bisoprolol in the future (will not add today as starting mexiletine).   - I will arrange for repeat echo in 11/24.  2. CAD: s/p CABG 2007 complicated by early graft closure.  Repeat cath in 2007 showed only LIMA-LAD still open and she had DES to LCX/OM.  Cath in 2016 showed this stent was occluded.  She has not had targets for redo CABG and she does not have good  interventional targets. No recent chest pain. - Continue ASA 81, Plavix 75 mg daily.  - Continue ranolazone 1000 bid.   - Continue Crestor 20 mg daily + Zetia 10 mg daily, and Vascepa. Good lipids 1/24. 3. Smoking:  Still smoking about 1/2 ppd.  She is trying to quit.  - Does not want Chantix.  4. PAD: s/p atherectomy/angioplasty right SFA in 7/21. Has leg pain and weakness chronically. Peripheral arterial dopplers in 6/24 with occluded left SFA.  No rest pain or pedal ulcers.  - Follows with Dr. Allyson Sabal for PV, needs to set up appt.  5. Carotid stenosis: Stable moderate LICA stenosis with patent right carotid stent.  - Needs carotid dopplers 10/24.     6. Fe deficiency anemia: Has had IV Fe in the past. May contribute to fatigue.  - Check Fe studies today, treat with IV Fe if low.  7. Small cell lung cancer: Finished chemo and radiation.  - She is followed by Dr. Arbutus Ped. 8. VT: ICD discharge in 4/24.  No chest pain or CHF exacerbation.  Suspect scar-mediated.  She was started on amiodarone but had to stop due to severe imbalance and falls, now resolved.  - I will start her on mexiletine 150 mg bid.   Followup with echo in 11/24  Marca Ancona  05/20/2023

## 2023-05-21 ENCOUNTER — Ambulatory Visit: Payer: Medicare Other | Admitting: Student

## 2023-05-27 ENCOUNTER — Inpatient Hospital Stay: Payer: Medicare Other | Attending: Internal Medicine

## 2023-05-27 ENCOUNTER — Inpatient Hospital Stay: Payer: Medicare Other

## 2023-05-27 DIAGNOSIS — C3490 Malignant neoplasm of unspecified part of unspecified bronchus or lung: Secondary | ICD-10-CM

## 2023-05-27 DIAGNOSIS — Z85118 Personal history of other malignant neoplasm of bronchus and lung: Secondary | ICD-10-CM | POA: Diagnosis not present

## 2023-05-27 DIAGNOSIS — Z452 Encounter for adjustment and management of vascular access device: Secondary | ICD-10-CM | POA: Insufficient documentation

## 2023-05-27 DIAGNOSIS — D509 Iron deficiency anemia, unspecified: Secondary | ICD-10-CM

## 2023-05-27 DIAGNOSIS — Z95828 Presence of other vascular implants and grafts: Secondary | ICD-10-CM

## 2023-05-27 MED ORDER — SODIUM CHLORIDE 0.9% FLUSH
10.0000 mL | Freq: Once | INTRAVENOUS | Status: AC
  Administered 2023-05-27: 10 mL

## 2023-05-27 MED ORDER — HEPARIN SOD (PORK) LOCK FLUSH 100 UNIT/ML IV SOLN
500.0000 [IU] | Freq: Once | INTRAVENOUS | Status: AC
  Administered 2023-05-27: 500 [IU]

## 2023-05-29 ENCOUNTER — Other Ambulatory Visit (HOSPITAL_COMMUNITY): Payer: Medicare Other

## 2023-05-29 ENCOUNTER — Telehealth (HOSPITAL_COMMUNITY): Payer: Self-pay | Admitting: Pharmacy Technician

## 2023-05-29 ENCOUNTER — Ambulatory Visit (HOSPITAL_COMMUNITY)
Admission: RE | Admit: 2023-05-29 | Discharge: 2023-05-29 | Disposition: A | Discharge status: Home / Self Care | Payer: Medicare Other | Source: Ambulatory Visit | Attending: Cardiology | Admitting: Cardiology

## 2023-05-29 ENCOUNTER — Other Ambulatory Visit (HOSPITAL_COMMUNITY): Payer: Self-pay

## 2023-05-29 DIAGNOSIS — I5022 Chronic systolic (congestive) heart failure: Secondary | ICD-10-CM | POA: Diagnosis not present

## 2023-05-29 LAB — BASIC METABOLIC PANEL
Anion gap: 16 — ABNORMAL HIGH (ref 5–15)
BUN: 29 mg/dL — ABNORMAL HIGH (ref 8–23)
CO2: 30 mmol/L (ref 22–32)
Calcium: 10.2 mg/dL (ref 8.9–10.3)
Chloride: 85 mmol/L — ABNORMAL LOW (ref 98–111)
Creatinine, Ser: 1.04 mg/dL — ABNORMAL HIGH (ref 0.44–1.00)
GFR, Estimated: 58 mL/min — ABNORMAL LOW (ref >60)
Glucose, Bld: 110 mg/dL — ABNORMAL HIGH (ref 70–99)
Potassium: 3.1 mmol/L — ABNORMAL LOW (ref 3.5–5.1)
Sodium: 131 mmol/L — ABNORMAL LOW (ref 135–145)

## 2023-05-29 NOTE — Telephone Encounter (Signed)
Patient Advocate Encounter   Received notification from [facility] that prior authorization for Mexiletine is required.   PA submitted on CoverMyMeds Key [id number] Status is pending   Will continue to follow.

## 2023-05-30 ENCOUNTER — Other Ambulatory Visit (HOSPITAL_COMMUNITY): Payer: Self-pay

## 2023-05-30 NOTE — Telephone Encounter (Signed)
Advanced Heart Failure Patient Advocate Encounter  Prior Authorization for Mexiletine has been approved.    PA# [id number] Effective dates: 05/20/23 through "until further notice."  Patients co-pay is $1.55 (90 days)

## 2023-05-31 ENCOUNTER — Telehealth (HOSPITAL_COMMUNITY): Payer: Self-pay

## 2023-05-31 ENCOUNTER — Other Ambulatory Visit (HOSPITAL_COMMUNITY): Payer: Self-pay

## 2023-05-31 DIAGNOSIS — I5022 Chronic systolic (congestive) heart failure: Secondary | ICD-10-CM

## 2023-05-31 MED ORDER — POTASSIUM CHLORIDE ER 10 MEQ PO CPCR: 20.0000 meq | ORAL_CAPSULE | Freq: Two times a day (BID) | ORAL | 11 refills | Status: DC

## 2023-05-31 NOTE — Telephone Encounter (Signed)
-----   Message from Marca Ancona sent at 05/30/2023  9:14 PM EDT ----- If she is taking KCl 10 mEq bid, need to increase to 20 mEq bid with BMET 10 days.

## 2023-05-31 NOTE — Telephone Encounter (Signed)
Patient's med list has been changed and updated. In addition, pt's labs has been ordered and her appointment scheduled. Pt aware, agreeable, and verbalized understanding.

## 2023-06-04 NOTE — Telephone Encounter (Signed)
Advanced Heart Failure Patient Advocate Encounter  Called and spoke with the patient.   Charlann Boxer, CPhT

## 2023-06-06 ENCOUNTER — Telehealth (HOSPITAL_COMMUNITY): Payer: Self-pay

## 2023-06-06 MED ORDER — POTASSIUM CHLORIDE ER 10 MEQ PO CPCR: 20.0000 meq | ORAL_CAPSULE | Freq: Two times a day (BID) | ORAL | 11 refills | Status: DC

## 2023-06-10 ENCOUNTER — Ambulatory Visit (HOSPITAL_COMMUNITY)
Admission: RE | Admit: 2023-06-10 | Discharge: 2023-06-10 | Disposition: A | Discharge status: Home / Self Care | Payer: Medicare Other | Source: Ambulatory Visit | Attending: Cardiology | Admitting: Cardiology

## 2023-06-10 DIAGNOSIS — I5022 Chronic systolic (congestive) heart failure: Secondary | ICD-10-CM | POA: Insufficient documentation

## 2023-06-10 LAB — BASIC METABOLIC PANEL
Anion gap: 10 (ref 5–15)
BUN: 29 mg/dL — ABNORMAL HIGH (ref 8–23)
CO2: 30 mmol/L (ref 22–32)
Calcium: 9.7 mg/dL (ref 8.9–10.3)
Chloride: 88 mmol/L — ABNORMAL LOW (ref 98–111)
Creatinine, Ser: 1.12 mg/dL — ABNORMAL HIGH (ref 0.44–1.00)
GFR, Estimated: 53 mL/min — ABNORMAL LOW (ref >60)
Glucose, Bld: 164 mg/dL — ABNORMAL HIGH (ref 70–99)
Potassium: 4.2 mmol/L (ref 3.5–5.1)
Sodium: 128 mmol/L — ABNORMAL LOW (ref 135–145)

## 2023-06-12 ENCOUNTER — Ambulatory Visit (HOSPITAL_COMMUNITY)
Admission: RE | Admit: 2023-06-12 | Discharge: 2023-06-12 | Disposition: A | Discharge status: Home / Self Care | Payer: Medicare Other | Source: Ambulatory Visit | Attending: Internal Medicine | Admitting: Internal Medicine

## 2023-06-12 DIAGNOSIS — I5022 Chronic systolic (congestive) heart failure: Secondary | ICD-10-CM | POA: Insufficient documentation

## 2023-06-12 DIAGNOSIS — I6523 Occlusion and stenosis of bilateral carotid arteries: Secondary | ICD-10-CM

## 2023-06-19 ENCOUNTER — Other Ambulatory Visit (HOSPITAL_COMMUNITY): Payer: Self-pay | Admitting: Cardiology

## 2023-06-19 MED ORDER — VERQUVO 10 MG PO TABS: 1.0000 | ORAL_TABLET | Freq: Every day | ORAL | 3 refills | Status: DC

## 2023-06-24 NOTE — Telephone Encounter (Signed)
Patient's med sent to pharmacy

## 2023-07-22 DIAGNOSIS — E11319 Type 2 diabetes mellitus with unspecified diabetic retinopathy without macular edema: Secondary | ICD-10-CM | POA: Diagnosis not present

## 2023-07-22 DIAGNOSIS — H2513 Age-related nuclear cataract, bilateral: Secondary | ICD-10-CM | POA: Diagnosis not present

## 2023-07-22 DIAGNOSIS — H53143 Visual discomfort, bilateral: Secondary | ICD-10-CM | POA: Diagnosis not present

## 2023-07-22 LAB — HM DIABETES EYE EXAM

## 2023-07-23 ENCOUNTER — Ambulatory Visit (INDEPENDENT_AMBULATORY_CARE_PROVIDER_SITE_OTHER): Payer: Medicare Other

## 2023-07-23 DIAGNOSIS — E1121 Type 2 diabetes mellitus with diabetic nephropathy: Secondary | ICD-10-CM | POA: Diagnosis not present

## 2023-07-23 DIAGNOSIS — E1165 Type 2 diabetes mellitus with hyperglycemia: Secondary | ICD-10-CM | POA: Diagnosis not present

## 2023-07-23 DIAGNOSIS — I10 Essential (primary) hypertension: Secondary | ICD-10-CM | POA: Diagnosis not present

## 2023-07-23 DIAGNOSIS — I5022 Chronic systolic (congestive) heart failure: Secondary | ICD-10-CM

## 2023-07-23 DIAGNOSIS — I509 Heart failure, unspecified: Secondary | ICD-10-CM | POA: Diagnosis not present

## 2023-07-23 DIAGNOSIS — I255 Ischemic cardiomyopathy: Secondary | ICD-10-CM

## 2023-07-23 DIAGNOSIS — E041 Nontoxic single thyroid nodule: Secondary | ICD-10-CM | POA: Diagnosis not present

## 2023-07-23 DIAGNOSIS — E7800 Pure hypercholesterolemia, unspecified: Secondary | ICD-10-CM | POA: Diagnosis not present

## 2023-07-23 DIAGNOSIS — G629 Polyneuropathy, unspecified: Secondary | ICD-10-CM | POA: Diagnosis not present

## 2023-07-24 LAB — CUP PACEART REMOTE DEVICE CHECK
Battery Remaining Longevity: 144 mo
Battery Remaining Percentage: 100 %
Brady Statistic RV Percent Paced: 0 %
Date Time Interrogation Session: 20241022041100
HighPow Impedance: 96 Ohm
Implantable Lead Connection Status: 753985
Implantable Lead Implant Date: 20211015
Implantable Lead Location: 753860
Implantable Lead Model: 672
Implantable Lead Serial Number: 166771
Implantable Pulse Generator Implant Date: 20211015
Lead Channel Impedance Value: 971 Ohm
Lead Channel Setting Pacing Amplitude: 3.5 V
Lead Channel Setting Pacing Pulse Width: 0.4 ms
Lead Channel Setting Sensing Sensitivity: 0.5 mV
Pulse Gen Serial Number: 279742
Zone Setting Status: 755011

## 2023-07-29 ENCOUNTER — Other Ambulatory Visit (HOSPITAL_COMMUNITY): Payer: Self-pay | Admitting: Cardiology

## 2023-07-29 MED ORDER — POTASSIUM CHLORIDE ER 10 MEQ PO CPCR: 20.0000 meq | ORAL_CAPSULE | Freq: Two times a day (BID) | ORAL | 3 refills | Status: DC

## 2023-07-31 ENCOUNTER — Other Ambulatory Visit: Payer: Self-pay

## 2023-07-31 ENCOUNTER — Ambulatory Visit (HOSPITAL_COMMUNITY)
Admission: RE | Admit: 2023-07-31 | Discharge: 2023-07-31 | Disposition: A | Discharge status: Home / Self Care | Payer: Medicare Other | Source: Ambulatory Visit | Attending: Internal Medicine | Admitting: Internal Medicine

## 2023-07-31 ENCOUNTER — Inpatient Hospital Stay: Payer: Medicare Other

## 2023-07-31 ENCOUNTER — Inpatient Hospital Stay: Payer: Medicare Other | Attending: Internal Medicine

## 2023-07-31 DIAGNOSIS — D509 Iron deficiency anemia, unspecified: Secondary | ICD-10-CM | POA: Insufficient documentation

## 2023-07-31 DIAGNOSIS — C3490 Malignant neoplasm of unspecified part of unspecified bronchus or lung: Secondary | ICD-10-CM | POA: Insufficient documentation

## 2023-07-31 DIAGNOSIS — Z95828 Presence of other vascular implants and grafts: Secondary | ICD-10-CM

## 2023-07-31 DIAGNOSIS — J439 Emphysema, unspecified: Secondary | ICD-10-CM | POA: Diagnosis not present

## 2023-07-31 DIAGNOSIS — Z85118 Personal history of other malignant neoplasm of bronchus and lung: Secondary | ICD-10-CM | POA: Diagnosis not present

## 2023-07-31 DIAGNOSIS — Z720 Tobacco use: Secondary | ICD-10-CM | POA: Diagnosis not present

## 2023-07-31 DIAGNOSIS — I700 Atherosclerosis of aorta: Secondary | ICD-10-CM | POA: Diagnosis not present

## 2023-07-31 DIAGNOSIS — D508 Other iron deficiency anemias: Secondary | ICD-10-CM

## 2023-07-31 LAB — CMP (CANCER CENTER ONLY)
ALT: 9 U/L (ref 0–44)
AST: 12 U/L — ABNORMAL LOW (ref 15–41)
Albumin: 4.1 g/dL (ref 3.5–5.0)
Alkaline Phosphatase: 74 U/L (ref 38–126)
Anion gap: 10 (ref 5–15)
BUN: 32 mg/dL — ABNORMAL HIGH (ref 8–23)
CO2: 33 mmol/L — ABNORMAL HIGH (ref 22–32)
Calcium: 9.5 mg/dL (ref 8.9–10.3)
Chloride: 91 mmol/L — ABNORMAL LOW (ref 98–111)
Creatinine: 1.45 mg/dL — ABNORMAL HIGH (ref 0.44–1.00)
GFR, Estimated: 39 mL/min — ABNORMAL LOW (ref >60)
Glucose, Bld: 262 mg/dL — ABNORMAL HIGH (ref 70–99)
Potassium: 2.9 mmol/L — ABNORMAL LOW (ref 3.5–5.1)
Sodium: 134 mmol/L — ABNORMAL LOW (ref 135–145)
Total Bilirubin: 0.4 mg/dL (ref 0.3–1.2)
Total Protein: 6.9 g/dL (ref 6.5–8.1)

## 2023-07-31 LAB — IRON AND IRON BINDING CAPACITY (CC-WL,HP ONLY)
Iron: 62 ug/dL (ref 28–170)
Saturation Ratios: 10 % — ABNORMAL LOW (ref 10.4–31.8)
TIBC: 605 ug/dL — ABNORMAL HIGH (ref 250–450)
UIBC: 543 ug/dL — ABNORMAL HIGH (ref 148–442)

## 2023-07-31 LAB — CBC WITH DIFFERENTIAL (CANCER CENTER ONLY)
Abs Immature Granulocytes: 0.01 10*3/uL (ref 0.00–0.07)
Basophils Absolute: 0 10*3/uL (ref 0.0–0.1)
Basophils Relative: 0 %
Eosinophils Absolute: 0 10*3/uL (ref 0.0–0.5)
Eosinophils Relative: 1 %
HCT: 41 % (ref 36.0–46.0)
Hemoglobin: 14.1 g/dL (ref 12.0–15.0)
Immature Granulocytes: 0 %
Lymphocytes Relative: 17 %
Lymphs Abs: 1 10*3/uL (ref 0.7–4.0)
MCH: 29.6 pg (ref 26.0–34.0)
MCHC: 34.4 g/dL (ref 30.0–36.0)
MCV: 86 fL (ref 80.0–100.0)
Monocytes Absolute: 0.7 10*3/uL (ref 0.1–1.0)
Monocytes Relative: 11 %
Neutro Abs: 4.3 10*3/uL (ref 1.7–7.7)
Neutrophils Relative %: 71 %
Platelet Count: 212 10*3/uL (ref 150–400)
RBC: 4.77 MIL/uL (ref 3.87–5.11)
RDW: 14.5 % (ref 11.5–15.5)
WBC Count: 6 10*3/uL (ref 4.0–10.5)
nRBC: 0 % (ref 0.0–0.2)

## 2023-07-31 LAB — FERRITIN: Ferritin: 10 ng/mL — ABNORMAL LOW (ref 11–307)

## 2023-07-31 MED ORDER — HEPARIN SOD (PORK) LOCK FLUSH 100 UNIT/ML IV SOLN
500.0000 [IU] | Freq: Once | INTRAVENOUS | Status: AC
  Administered 2023-07-31: 500 [IU] via INTRAVENOUS

## 2023-07-31 MED ORDER — SODIUM CHLORIDE 0.9% FLUSH
10.0000 mL | Freq: Once | INTRAVENOUS | Status: AC
  Administered 2023-07-31: 10 mL

## 2023-07-31 MED ORDER — HEPARIN SOD (PORK) LOCK FLUSH 100 UNIT/ML IV SOLN
INTRAVENOUS | Status: AC
Start: 2023-07-31 — End: ?
  Filled 2023-07-31: qty 5

## 2023-07-31 MED ORDER — IOHEXOL 300 MG/ML  SOLN
60.0000 mL | Freq: Once | INTRAMUSCULAR | Status: AC | PRN
  Administered 2023-07-31: 60 mL via INTRAVENOUS

## 2023-08-05 ENCOUNTER — Inpatient Hospital Stay: Payer: Medicare Other | Attending: Internal Medicine | Admitting: Internal Medicine

## 2023-08-05 VITALS — BP 141/77 | HR 89 | Temp 95.2°F | Resp 16 | Ht <= 58 in | Wt 111.3 lb

## 2023-08-05 DIAGNOSIS — C3490 Malignant neoplasm of unspecified part of unspecified bronchus or lung: Secondary | ICD-10-CM | POA: Diagnosis not present

## 2023-08-05 DIAGNOSIS — D509 Iron deficiency anemia, unspecified: Secondary | ICD-10-CM | POA: Diagnosis not present

## 2023-08-05 DIAGNOSIS — Z85118 Personal history of other malignant neoplasm of bronchus and lung: Secondary | ICD-10-CM | POA: Insufficient documentation

## 2023-08-05 NOTE — Progress Notes (Signed)
[facility] [phone]   [phone]  OFFICE PROGRESS NOTE  Tower, Audrie Gallus, MD [address] [place] [address]  DIAGNOSIS:  1) Limited stage (T3, N2, M0) small cell lung cancer presented with right hilar and mediastinal lymphadenopathy. There was no hypermetabolism in the left adrenal gland lesion. Diagnosed in December 2022. 2)  Iron Deficiency Anemia   PRIOR THERAPY:  1) Systemic chemotherapy with systemic chemotherapy with carboplatin for an AUC of 5, etoposide 100 mg per metered squared, and Imfinzi 1500 mg IV every 3 weeks with Cosela for myeloprotection.  First dose on 10/16/2021.  Discontinued after 1 cycle as the patient staging PET scan showed limited stage disease. 2) systemic chemotherapy with carboplatin for an AUC of 5 on day 1 and etoposide 100 mg/m2 on days 1, 2, and 3 IV every 3 weeks.  First dose on 11/06/2021.  This will be concurrent with radiotherapy under the care of Dr. Roselind Messier.  Status post 3 cycles. 3) Venofer infusions as needed. Last dose on 02/07/21    CURRENT THERAPY: Observation  INTERVAL HISTORY: Diane [name] 67 y.o. female returns to the clinic today for follow-up visit.  Discussed the use of AI scribe software for clinical note transcription with the patient, who gave verbal consent to proceed.  History of Present Illness   The patient, a 67 year old individual with a history of limited stage small cell lung cancer, iron deficiency anemia, and advanced heart failure, presents for a follow-up visit. She was previously treated with carboplatin and etoposide chemotherapy, concurrent radiation, and Venofer iron infusions.  The patient reports persistent fatigue and weakness, which has limited her ability to exercise or engage in physical activities. Despite a good hemoglobin level, she expresses concerns about feeling cold all the time, which she attributes to low iron levels. The patient denies any noticeable  bleeding or blood in her stool.  She has been taking potassium supplements twice daily due to a previous diagnosis of low potassium. The patient also mentions a recent episode of high blood sugar, which she attributes to a carbohydrate-rich breakfast consumed before the blood test.  The patient has been under observation since May 2023. She expresses relief at the absence of any visible cancer growth or spread on her recent chest scan, but acknowledges the potential for the cancer to start growing at any time. She also mentions the presence of scar tissue from previous treatments.  The patient has a port for medication administration, which she reports has had issues with blood return if not flushed every two months. She agrees to a revised flushing schedule of every six weeks.       MEDICAL HISTORY: Past Medical History:  Diagnosis Date   Anemia    Arthritis    Carotid stenosis    a. s/p right carotid stent 10/2017.   Chronic systolic CHF (congestive heart failure) (HCC)    Colon polyps    COPD (chronic obstructive pulmonary disease) (HCC)    Coronary artery disease    post CABG in 3/07 , coronary stents    Depression    after major stroke years ago, no longer treated   Diabetes mellitus    type 2   Dyslipidemia    Fatty liver    Fibromyalgia    GERD (gastroesophageal reflux disease)    Headache    hx of    History of kidney stones    History of radiation therapy  right lung 11/15/2021-12/26/2021  Dr Antony Blackbird   Hyperlipidemia    Hypertension    Lung cancer [facility])    Myocardial infarction [facility])    Pneumonia    hx of    Seizures (HCC)    last seizure- 03/2013    Shortness of breath dyspnea    with exertion or when fluid builds up    Sleep apnea    used to wear a cpap- not used in 3 years    Status post dilation of esophageal narrowing    Stroke [facility]) 1993   problems with balance    Systolic murmur    known mild AS and MR   Thyroid goiter    Tobacco abuse      ALLERGIES:  is allergic to bee venom, benadryl [diphenhydramine], entresto [sacubitril-valsartan], farxiga [dapagliflozin], tetracycline, canagliflozin, sitagliptin, and ace inhibitors.  MEDICATIONS:  Current Outpatient Medications  Medication Sig Dispense Refill   acetaminophen (TYLENOL) 500 MG tablet Take 1,000 mg by mouth at bedtime.     albuterol (VENTOLIN HFA) 108 (90 Base) MCG/ACT inhaler INHALE 2 PUFFS EVERY 6 HOURS AS NEEDED FOR WHEEZING/SHORTNESS OF BREATH 18 each 0   aspirin EC 81 MG tablet Take 81 mg by mouth every evening.  30 tablet 6   BD PEN NEEDLE NANO 2ND GEN 32G X 4 MM MISC Inject into the skin 3 (three) times daily.     clopidogrel (PLAVIX) 75 MG tablet Take 1 tablet (75 mg total) by mouth daily. 90 tablet 3   digoxin (LANOXIN) 0.125 MG tablet Take 0.5 tablets (62.5 mcg total) by mouth daily. 45 tablet 3   ezetimibe (ZETIA) 10 MG tablet Take 1 tablet (10 mg total) by mouth daily. PLEASE CALL OFFICE FOR FOLLOW UP APPOINTMENT 90 tablet 3   fluticasone (FLONASE) 50 MCG/ACT nasal spray Place 2 sprays into both nostrils daily. 48 g 3   gabapentin (NEURONTIN) 300 MG capsule TAKE 2 CAPSULES DAILY AT   BEDTIME 180 capsule 3   glucose blood (ONETOUCH ULTRA) test strip 3 (three) times daily.     guaiFENesin (MUCINEX) 600 MG 12 hr tablet Take 2 tablets (1,200 mg total) by mouth 2 (two) times daily. 360 tablet 3   HYDROcodone-acetaminophen (NORCO) 5-325 MG tablet Take 1 tablet by mouth every 6 (six) hours as needed for moderate pain. 30 tablet 0   icosapent Ethyl (VASCEPA) 1 g capsule Take 1 g by mouth 2 (two) times daily.     insulin aspart protamine- aspart (NOVOLOG MIX 70/30) (70-30) 100 UNIT/ML injection Inject 55 Units into the skin 2 (two) times daily with a meal. 5 units with lunch     levETIRAcetam (KEPPRA) 1000 MG tablet Take 1 tablet (1,000 mg total) by mouth 2 (two) times daily. 180 tablet 3   Magnesium 400 MG TABS Take 400 mg by mouth 2 (two) times daily. 180 tablet 3    metFORMIN (GLUCOPHAGE) 500 MG tablet Take 500 mg by mouth 2 (two) times daily with a meal.     metolazone (ZAROXOLYN) 2.5 MG tablet TAKE 1 TABLET TWICE WEEKLY,EVERY MONDAY AND FRIDAY    (CHANGE IN DOSAGE). 26 tablet 3   mexiletine (MEXITIL) 150 MG capsule Take 1 capsule (150 mg total) by mouth 2 (two) times daily. 180 capsule 3   nitroGLYCERIN (NITROLINGUAL) 0.4 MG/SPRAY spray Place 1 spray under the tongue every 5 (five) minutes x 3 doses as needed for chest pain. 12 g 3   nystatin (MYCOSTATIN/NYSTOP) powder APPLY 1 APPLICATION  TOPICALLY 4 TIMES A DAY AS NEEDED FOR YEAST INFECTIONS 60 g 5   ONE TOUCH ULTRA TEST test strip USE AS DIRECTED FOR TESTING BLOOD GLUCOSE 3 TIMES DAILY  1   pantoprazole (PROTONIX) 40 MG tablet Take 1 tablet (40 mg total) by mouth daily. 90 tablet 3   polyethylene glycol (MIRALAX / GLYCOLAX) 17 g packet Take 17 g by mouth daily.     potassium chloride (MICRO-K) 10 MEQ CR capsule Take 2 capsules (20 mEq total) by mouth 2 (two) times daily. 360 capsule 3   promethazine (PHENERGAN) 12.5 MG tablet Take 1 tablet (12.5 mg total) by mouth every 6 (six) hours as needed for nausea or vomiting. 30 tablet 2   ranolazine (RANEXA) 1000 MG SR tablet TAKE 1 TABLET TWICE A DAY 180 tablet 3   rosuvastatin (CRESTOR) 20 MG tablet Take 1 tablet (20 mg total) by mouth daily. 90 tablet 3   sodium chloride (OCEAN) 0.65 % SOLN nasal spray Place 1 spray into both nostrils as needed for congestion.     spironolactone (ALDACTONE) 25 MG tablet Take 1 tablet (25 mg total) by mouth at bedtime. 90 tablet 3   torsemide (DEMADEX) 20 MG tablet Take 4 tablets (80 mg total) by mouth every morning AND 3 tablets (60 mg total) every evening. 540 tablet 3   triamcinolone cream (KENALOG) 0.5 % Apply 1 application topically 3 (three) times daily as needed (rash). To affected areas     Vericiguat (VERQUVO) 10 MG TABS Take 1 tablet (10 mg total) by mouth daily. 90 tablet 3   No current facility-administered  medications for this visit.    SURGICAL HISTORY:  Past Surgical History:  Procedure Laterality Date   ABDOMINAL AORTOGRAM W/LOWER EXTREMITY N/A 04/18/2020   Procedure: ABDOMINAL AORTOGRAM W/LOWER EXTREMITY;  Surgeon: Runell Gess, MD;  [facility];  Service: Cardiovascular;  Laterality: N/A;   APPENDECTOMY     BIOPSY  02/03/2019   Procedure: BIOPSY;  Surgeon: Meryl Dare, MD;  Location: [facility] ENDOSCOPY;  Service: Endoscopy;;   BREAST BIOPSY Left 2016   BRONCHIAL BIOPSY  09/18/2021   Procedure: BRONCHIAL BIOPSIES;  Surgeon: Leslye Peer, MD;  Location: [facility] ENDOSCOPY;  Service: Pulmonary;;   BRONCHIAL BRUSHINGS  09/18/2021   Procedure: BRONCHIAL BRUSHINGS;  Surgeon: Leslye Peer, MD;  Location: [facility] ENDOSCOPY;  Service: Pulmonary;;   BRONCHIAL NEEDLE ASPIRATION BIOPSY  09/18/2021   Procedure: BRONCHIAL NEEDLE ASPIRATION BIOPSIES;  Surgeon: Leslye Peer, MD;  Location: [facility] ENDOSCOPY;  Service: Pulmonary;;   CARDIAC CATHETERIZATION  11/29/05   EF of 55%   CARDIAC CATHETERIZATION  08/06/06   EF of 45-50%   CARDIAC CATHETERIZATION N/A 05/06/2015   Procedure: Right/Left Heart Cath and Coronary/Graft Angiography;  Surgeon: Peter M Swaz[place]and, MD;  [facility]: [facility];  Service: Cardiovascular;  Laterality: N/A;   CAROTID PTA/STENT INTERVENTION N/A 10/10/2017   Procedure: CAROTID PTA/STENT INTERVENTION - Right;  Surgeon: Nada Libman, MD;  Location: [facility];  Service: Cardiovascular;  Laterality: N/A;   CERVICAL FUSION  1990   CHOLECYSTECTOMY     COLON RESECTION     mass removed and 4 in of colon   COLONOSCOPY WITH PROPOFOL N/A 07/16/2017   Procedure: COLONOSCOPY WITH PROPOFOL;  Surgeon: Meryl Dare, MD;  Location: [facility] ENDOS[facility];  Service: Endoscopy;  Laterality: N/A;   CORONARY ARTERY BYPASS GRAFT  12/04/2005   x5 -- left internal mammary artery to the LAD, left radial artery  to the ramus intermedius, saphenous vein graft to the obtuse marginal 1,  sequential saphenous vein grat to the acute marginal and posterior descending, endoscopic vein harvesting from the left thigh with open vein harvest from right leg   CORONARY STENT PLACEMENT  08/11/06   PCI of her ciurcumflex/OM vessel   ESOPHAGOGASTRODUODENOSCOPY (EGD) WITH PROPOFOL N/A 02/03/2019   Procedure: ESOPHAGOGASTRODUODENOSCOPY (EGD) WITH PROPOFOL;  Surgeon: Meryl Dare, MD;  Location: [facility] ENDOS[facility];  Service: Endoscopy;  Laterality: N/A;   FINE NEEDLE ASPIRATION  09/18/2021   Procedure: FINE NEEDLE ASPIRATION (FNA) LINEAR;  Surgeon: Leslye Peer, MD;  Location[facility] ENDOSCOPY;  Service: Pulmonary;;   ICD IMPLANT N/A 07/15/2020   Procedure: ICD IMPLANT;  Surgeon: Lanier Prude, MD;  [facility];  Service: Cardiovascular;  Laterality: N/A;   IR IMAGING GUIDED PORT INSERTION  10/20/2021   LAPAROTOMY Bilateral 05/19/2015   Procedure: EXPLORATORY LAPAROTOMY WITH BILATERAL SALPINGO OOPHORECTOMY /OMENTECTOMY/SEGMENTAL SIGMOID COLECTOMY ;  Surgeon: Adolphus Birchwood, MD;  Location: [facility] ORS;  Service: Gynecology;  Laterality: Bilateral;   PERIPHERAL VASCULAR BALLOON ANGIOPLASTY  04/18/2020   Procedure: PERIPHERAL VASCULAR BALLOON ANGIOPLASTY;  Surgeon: Runell Gess, MD;  [facility];  Service: Cardiovascular;;  rt SFA  atherectomy and DCB   PERIPHERAL VASCULAR CATHETERIZATION N/A 11/15/2015   Procedure: Carotid PTA/Stent Intervention;  Surgeon: Runell Gess, MD;  [facility];  Service: Cardiovascular;  Laterality: N/A;   PERIPHERAL VASCULAR CATHETERIZATION  11/15/2015   Procedure: Carotid Angiography;  Surgeon: Runell Gess, MD;  Location: [facility] INVASIVE CV LAB;  Service: Cardiovascular;;   RIGHT HEART CATH N/A 01/27/2020   Procedure: RIGHT HEART CATH;  Surgeon: Laurey Morale, MD;  Location: [facility] INVASIVE CV LAB;  Service: Cardiovascular;  Laterality: N/A;   VIDEO BRONCHOSCOPY WITH ENDOBRONCHIAL ULTRASOUND N/A 09/18/2021   Procedure:  VIDEO BRONCHOSCOPY WITH ENDOBRONCHIAL ULTRASOUND;  Surgeon: Leslye Peer, MD;  Location[facility];  Service: Pulmonary;  Laterality: N/A;   VIDEO BRONCHOSCOPY WITH RADIAL ENDOBRONCHIAL ULTRASOUND  09/18/2021   Procedure: RADIAL ENDOBRONCHIAL ULTRASOUND;  Surgeon: Leslye Peer, MD;  Location: [facility]OS[facility];  Service: Pulmonary;;    REVIEW OF SYSTEMS:  Constitutional: positive for fatigue Eyes: negative Ears, nose, mouth, throat, and face: negative Respiratory: positive for dyspnea on exertion Cardiovascular: negative Gastrointestinal: negative Genitourinary:negative Integument/breast: negative Hematologic/lymphatic: negative Musculoskeletal:negative Neurological: negative Behavioral/Psych: negative Endocrine: negative Allergic/Immunologic: negative   PHYSICAL EXAMINATION: General appearance: alert, cooperative, appears stated age, fatigued, and no distress Head: Normocephalic, without obvious abnormality, atraumatic Neck: no adenopathy, no JVD, supple, symmetrical, trachea midline, and thyroid not enlarged, symmetric, no tenderness/mass/nodules Lymph nodes: Cervical, supraclavicular, and axillary nodes normal. Resp: clear to auscultation bilaterally Back: symmetric, no curvature. ROM normal. No CVA tenderness. Cardio: regular rate and rhythm, S1, S2 normal, no murmur, click, rub or gallop GI: soft, non-tender; bowel sounds normal; no masses,  no organomegaly Extremities: extremities normal, atraumatic, no cyanosis or edema Neurologic: Alert and oriented X 3, normal strength and tone. Normal symmetric reflexes. Normal coordination and gait  ECOG PERFORMANCE STATUS: 1 - Symptomatic but completely ambulatory  Blood pressure (!) 141/77, pulse 89, temperature (!) 95.2 F (35.1 C), temperature source Tympanic, resp. rate 16, height 4\' 9"  (1.448 m), weight 111 lb 4.8 oz (50.5 kg), last menstrual period 09/02/2003, SpO2 100%.  LABORATORY DATA: Lab Results  Component Value Date    WBC 6.0 07/31/2023   HGB 14.1 07/31/2023   HCT 41.0 07/31/2023   MCV 86.0  07/31/2023   PLT 212 07/31/2023      Chemistry      Component Value Date/Time   NA 134 (L) 07/31/2023 1308   NA 135 03/26/2023 1310   NA 135 (L) 02/15/2017 1457   K 2.9 (L) 07/31/2023 1308   K 4.8 02/15/2017 1457   CL 91 (L) 07/31/2023 1308   CO2 33 (H) 07/31/2023 1308   CO2 26 02/15/2017 1457   BUN 32 (H) 07/31/2023 1308   BUN 28 (H) 03/26/2023 1310   BUN 10.8 02/15/2017 1457   CREATININE 1.45 (H) 07/31/2023 1308   CREATININE 0.69 05/06/2017 1405   CREATININE 0.7 02/15/2017 1457      Component Value Date/Time   CALCIUM 9.5 07/31/2023 1308   CALCIUM 9.3 02/15/2017 1457   ALKPHOS 74 07/31/2023 1308   ALKPHOS 75 02/15/2017 1457   AST 12 (L) 07/31/2023 1308   AST 9 02/15/2017 1457   ALT 9 07/31/2023 1308   ALT 13 02/15/2017 1457   BILITOT 0.4 07/31/2023 1308   BILITOT 0.28 02/15/2017 1457       RADIOGRAPHIC STUDIES: CT Chest W Contrast  Result Date: 08/02/2023 CLINICAL DATA:  Non-small-cell lung cancer. Restaging. * Tracking Code: BO * EXAM: CT CHEST WITH CONTRAST TECHNIQUE: Multidetector CT imaging of the chest was performed during intravenous contrast administration. RADIATION DOSE REDUCTION: This exam was performed according to the departmental dose-optimization program which includes automated exposure control, adjustment of the mA and/or kV according to patient size and/or use of iterative reconstruction technique. CONTRAST:  60mL OMNIPAQUE IOHEXOL 300 MG/ML  SOLN COMPARISON:  04/08/2023 FINDINGS: Cardiovascular: The heart size is normal. No substantial pericardial effusion. Coronary artery calcification is evident. Status post CABG. Left-sided permanent pacemaker noted. Right Port-A-Cath tip is positioned in the distal SVC. Ascending thoracic aorta measures 4.0 cm diameter. Mediastinum/Nodes: No mediastinal lymphadenopathy. Stable tiny right thyroid nodule. No followup imaging is recommended.  The esophagus has normal imaging features. There is no hilar lymphadenopathy. There is no axillary lymphadenopathy. Lungs/Pleura: Centrilobular emphsyema noted. Changes of radiation fibrosis noted parahilar right lung, stable. Stable architectural distortion/scarring in the lingula. No new suspicious pulmonary nodule or mass. No focal airspace consolidation. No pleural effusion. Upper Abdomen: Stable nodular thickening of both adrenal glands. Bilateral nonobstructing nephrolithiasis again noted. Pancreas divisum anatomy evident. Musculoskeletal: No worrisome lytic or sclerotic osseous abnormality. IMPRESSION: 1. Stable exam. No evidence for recurrent or metastatic disease in the chest. 2. Stable changes of radiation fibrosis in the parahilar right lung. 3. Bilateral nonobstructing nephrolithiasis. 4. Stable 4.0 cm diameter ascending thoracic aorta. Attention on follow-up restaging CT recommended. 5.  Emphysema (ICD10-J43.9) and Aortic Atherosclerosis (ICD10-170.0) Electronically Signed   By: Kennith Center M.D.   On: 08/02/2023 08:16   CUP PACEART REMOTE DEVICE CHECK  Result Date: 07/24/2023 Scheduled remote reviewed. Normal device function.  Next remote 91 days. ML, CVRS   ASSESSMENT AND PLAN: This is a very pleasant 67 years old white female recently diagnosed with Limited stage (T3, N2, M0) small cell lung cancer presented with right hilar and mediastinal lymphadenopathy. There was no hypermetabolism in the left adrenal gland lesion. Diagnosed in December 2022. She initially started treatment with carboplatin, etoposide, Cosela and Imfinzi with the expectation that she had extensive stage disease status post 1 cycle. Staging work-up with PET scan showed no hypermetabolic activity and the suspicious left adrenal gland lesion. Her treatment was switched to chemotherapy with with carboplatin for AUC of 5 on day 1, etoposide 100 Mg/M2 on days 1,  2 and 3 every 3 weeks concurrent with radiation.  First dose  today 11/06/2021.  Status post 3 cycles. She tolerated her treatment well with no concerning adverse effect except for fatigue as well as radiation-induced esophagitis with dysphagia and odynophagia. The patient declined prophylactic cranial irradiation. The patient is currently on observation and she is feeling fine. She had repeat CT scan of the chest performed recently.  I personally and independently reviewed the scan and discussed the result and showed the images to the patient today. Her scan showed no concerning findings for disease recurrence or metastasis.    Small Cell Lung Cancer In remission since May 2023 after treatment with carboplatin, etoposide, and radiation. Recent imaging shows no evidence of recurrence. -Continue observation. -Next follow-up in 6 months.  Iron Deficiency Anemia Hemoglobin is 14.1, serum iron is 62, iron saturation is 10%, and ferritin is 10. No evidence of bleeding. -Continue current management.  Hypokalemia Potassium level is 2.9 despite taking potassium supplements twice daily. -Continue potassium supplements twice daily.  Advanced Heart Failure Patient reports fatigue and weakness, which may be contributing to her symptoms. -Continue current management.  Port Maintenance Patient has a port that needs to be flushed every 6 weeks. -Arrange for port flush every 6 weeks.  The patient was advised to call immediately if she has any other concerning symptoms in the interval. The patient voices understanding of current disease status and treatment options and is in agreement with the current care plan.  All questions were answered. The patient knows to call the clinic with any problems, questions or concerns. We can certainly see the patient much sooner if necessary. The total time spent in the appointment was 30 minutes.  Disclaimer: This note was dictated with voice recognition software. Similar sounding words can inadvertently be transcribed and may  not be corrected upon review.

## 2023-08-09 NOTE — Progress Notes (Signed)
Remote ICD transmission.   

## 2023-08-20 ENCOUNTER — Encounter (HOSPITAL_COMMUNITY): Payer: Self-pay | Admitting: Cardiology

## 2023-08-20 ENCOUNTER — Other Ambulatory Visit (HOSPITAL_COMMUNITY): Payer: Self-pay

## 2023-08-20 ENCOUNTER — Ambulatory Visit (HOSPITAL_COMMUNITY)
Admission: RE | Admit: 2023-08-20 | Discharge: 2023-08-20 | Disposition: A | Discharge status: Home / Self Care | Payer: Medicare Other | Source: Ambulatory Visit | Attending: Cardiology | Admitting: Cardiology

## 2023-08-20 ENCOUNTER — Ambulatory Visit (HOSPITAL_BASED_OUTPATIENT_CLINIC_OR_DEPARTMENT_OTHER)
Admission: RE | Admit: 2023-08-20 | Discharge: 2023-08-20 | Disposition: A | Discharge status: Home / Self Care | Payer: Medicare Other | Source: Ambulatory Visit | Attending: Cardiology | Admitting: Cardiology

## 2023-08-20 VITALS — BP 110/70 | HR 87 | Wt 109.8 lb

## 2023-08-20 DIAGNOSIS — Z87891 Personal history of nicotine dependence: Secondary | ICD-10-CM | POA: Diagnosis not present

## 2023-08-20 DIAGNOSIS — I5022 Chronic systolic (congestive) heart failure: Secondary | ICD-10-CM

## 2023-08-20 DIAGNOSIS — R9431 Abnormal electrocardiogram [ECG] [EKG]: Secondary | ICD-10-CM | POA: Insufficient documentation

## 2023-08-20 DIAGNOSIS — E119 Type 2 diabetes mellitus without complications: Secondary | ICD-10-CM | POA: Insufficient documentation

## 2023-08-20 DIAGNOSIS — I429 Cardiomyopathy, unspecified: Secondary | ICD-10-CM | POA: Insufficient documentation

## 2023-08-20 DIAGNOSIS — R42 Dizziness and giddiness: Secondary | ICD-10-CM | POA: Diagnosis not present

## 2023-08-20 DIAGNOSIS — Z95810 Presence of automatic (implantable) cardiac defibrillator: Secondary | ICD-10-CM | POA: Diagnosis not present

## 2023-08-20 DIAGNOSIS — E785 Hyperlipidemia, unspecified: Secondary | ICD-10-CM | POA: Insufficient documentation

## 2023-08-20 DIAGNOSIS — Z8673 Personal history of transient ischemic attack (TIA), and cerebral infarction without residual deficits: Secondary | ICD-10-CM | POA: Diagnosis not present

## 2023-08-20 DIAGNOSIS — Z951 Presence of aortocoronary bypass graft: Secondary | ICD-10-CM | POA: Diagnosis not present

## 2023-08-20 DIAGNOSIS — G4730 Sleep apnea, unspecified: Secondary | ICD-10-CM | POA: Diagnosis not present

## 2023-08-20 DIAGNOSIS — Z85118 Personal history of other malignant neoplasm of bronchus and lung: Secondary | ICD-10-CM | POA: Diagnosis not present

## 2023-08-20 DIAGNOSIS — I340 Nonrheumatic mitral (valve) insufficiency: Secondary | ICD-10-CM | POA: Insufficient documentation

## 2023-08-20 DIAGNOSIS — J449 Chronic obstructive pulmonary disease, unspecified: Secondary | ICD-10-CM | POA: Insufficient documentation

## 2023-08-20 DIAGNOSIS — I2510 Atherosclerotic heart disease of native coronary artery without angina pectoris: Secondary | ICD-10-CM | POA: Insufficient documentation

## 2023-08-20 DIAGNOSIS — I252 Old myocardial infarction: Secondary | ICD-10-CM | POA: Diagnosis not present

## 2023-08-20 DIAGNOSIS — I110 Hypertensive heart disease with heart failure: Secondary | ICD-10-CM | POA: Insufficient documentation

## 2023-08-20 DIAGNOSIS — R5383 Other fatigue: Secondary | ICD-10-CM | POA: Diagnosis not present

## 2023-08-20 LAB — BASIC METABOLIC PANEL
Anion gap: 14 (ref 5–15)
BUN: 19 mg/dL (ref 8–23)
CO2: 28 mmol/L (ref 22–32)
Calcium: 9.9 mg/dL (ref 8.9–10.3)
Chloride: 93 mmol/L — ABNORMAL LOW (ref 98–111)
Creatinine, Ser: 1.15 mg/dL — ABNORMAL HIGH (ref 0.44–1.00)
GFR, Estimated: 52 mL/min — ABNORMAL LOW (ref >60)
Glucose, Bld: 72 mg/dL (ref 70–99)
Potassium: 4.1 mmol/L (ref 3.5–5.1)
Sodium: 135 mmol/L (ref 135–145)

## 2023-08-20 LAB — CBC
HCT: 46.9 % — ABNORMAL HIGH (ref 36.0–46.0)
Hemoglobin: 15.6 g/dL — ABNORMAL HIGH (ref 12.0–15.0)
MCH: 29.2 pg (ref 26.0–34.0)
MCHC: 33.3 g/dL (ref 30.0–36.0)
MCV: 87.8 fL (ref 80.0–100.0)
Platelets: 237 10*3/uL (ref 150–400)
RBC: 5.34 MIL/uL — ABNORMAL HIGH (ref 3.87–5.11)
RDW: 15.1 % (ref 11.5–15.5)
WBC: 6.1 10*3/uL (ref 4.0–10.5)
nRBC: 0 % (ref 0.0–0.2)

## 2023-08-20 LAB — ECHOCARDIOGRAM COMPLETE
AR max vel: 1.66 cm2
AV Area VTI: 1.55 cm2
AV Area mean vel: 1.58 cm2
AV Mean grad: 9 mm[Hg]
AV Peak grad: 16.6 mm[Hg]
Ao pk vel: 2.04 m/s
Area-P 1/2: 7.66 cm2
Calc EF: 45.7 %
MV VTI: 2.67 cm2
S' Lateral: 4.3 cm
Single Plane A2C EF: 39.8 %
Single Plane A4C EF: 46.3 %

## 2023-08-20 LAB — TSH: TSH: 0.899 u[IU]/mL (ref 0.350–4.500)

## 2023-08-20 LAB — DIGOXIN LEVEL: Digoxin Level: 0.6 ng/mL — ABNORMAL LOW (ref 0.8–2.0)

## 2023-08-20 MED ORDER — EZETIMIBE 10 MG PO TABS: 10.0000 mg | ORAL_TABLET | Freq: Every day | ORAL | 3 refills | Status: DC

## 2023-08-20 MED ORDER — MEXILETINE HCL 150 MG PO CAPS: 150.0000 mg | ORAL_CAPSULE | Freq: Two times a day (BID) | ORAL | 3 refills | Status: DC

## 2023-08-20 MED ORDER — NITROGLYCERIN 0.4 MG/SPRAY TL SOLN: 1.0000 | 3 refills | Status: DC | PRN

## 2023-08-20 NOTE — Progress Notes (Signed)
*  PRELIMINARY RESULTS* Echocardiogram 2D Echocardiogram has been performed.  Carolyne Fiscal 08/20/2023, 1:55 PM

## 2023-08-20 NOTE — Patient Instructions (Signed)
Please take your Mexiletine 150 mg Twice daily  Labs done today, your results will be available in MyChart, we will contact you for abnormal readings.  Your provider has ordered an iron infusion for you. Once approved by your insurance company you will be called to have the infusion arranged.  Your physician recommends that you schedule a follow-up appointment in: 4 months.  If you have any questions or concerns before your next appointment please send [place] a message through [employer] or call our office at [phone].    TO LEAVE A MESSAGE FOR THE NURSE SELECT OPTION 2, PLEASE LEAVE A MESSAGE INCLUDING: YOUR NAME DATE OF BIRTH CALL BACK NUMBER REASON FOR CALL**this is important as we prioritize the call backs  YOU WILL RECEIVE A CALL BACK THE SAME DAY AS LONG AS YOU CALL BEFORE 4:00 PM  At the [facility], you and your health needs are our priority. As part of our continuing mission to provide you with exceptional heart care, we have created designated Provider Care Teams. These Care Teams include your primary Cardiologist (physician) and Advanced Practice Providers (APPs- Physician Assistants and Nurse Practitioners) who all work together to provide you with the care you need, when you need it.   You may see any of the following providers on your designated Care Team at your next follow up: Dr Arvilla Meres Dr Marca Ancona Dr. Dorthula Nettles Dr. Clearnce Hasten Amy Filbert Schilder, NP Robbie Lis, [place] [facility] [place], [place] Brynda Peon, NP [place] Lee, NP Karle Plumber, PharmD   Please be sure to bring in all your medications bottles to every appointment.    Thank you for choosing [facility]

## 2023-08-21 NOTE — Progress Notes (Signed)
PCP: Judy Pimple, MD Cardiology: Dr. [place]az[place]and HF Cardiology: Dr. Shirlee Latch  67 y.o. with history of CAD, ischemic cardiomyopathy, CVA, and valvular disease was referred by Dr. Swaziland for CHF evaluation.  Patient has an extensive history of vascular disease.  She had CABG in 2007.  Repeat LHC in 2007 showed all grafts closed except LIMA-LAD.  Patient had DES to LCx/OM.  LHC 2016 showed occlusion of DES to LCx/OM with L-L and L-R collaterals. Poor targets for redo CABG.   In 2/21, echo was done showing EF 20-25% with moderate RV dysfunction, moderate-severe MR, moderate TR.  Despite smoking history, PFTs in 2019 looked relatively normal. She additionally had the placement of a carotid stent on the right in 1/19.    RHC was done in 4/21, showing near-normal filling pressures with CI 2.12 by Fick.  CPX in 5/21 showed peak VO2 13, VE/VCO2 slope 37 => moderate HF limitation.  The PFTs on the CPX were not markedly abnormal. She saw Dr. Allyson Sabal recently, peripheral arterial dopplers in 4/21 showed significant stenosis right SFA.   We worked her up for LVAD.  She was presented at [facility] and saw Dr. Donata Clay.  He was concerned by her small stature and small LV cavity. Concern that Heartmate 3 would not function well in her small LV, and Heartware LVAD that was thought to be more suitable for small patients has been discontinued.   We considered her for a barostimulation trial, but she does not qualify for either ANTHEM or Batwire due to carotid disease.   She is off dapagliflozin due to recurrrent GU yeast infections.  In 7/21, she had atherectomy of right SFA.  [employer] ICD placed in 10/21.   As she was turned down for LVAD at [facility], Dr. Shirlee Latch had her seen at [facility]. She underwent workup there with echo in 11/21 showing higher EF 35%. RHC, however, showed low CI 1.8.    Echo in 5/22 showed improvement in EF, now up to 45-50% with basal-mid inferolateral and anterolateral akinesis, normal RV.    Underwent bronchoscopy 12/19 for pulmonary nodules, suspicious for malignancy.  Follow up 12/22, NYHA II-III symptoms, mildly volume overloaded and torsemide increased to 60 bid.  Unfortunately diagnosed with small cell lung cancer 12/22 and has had treatment with chemo and radiation.   Echo in 11/23 with EF 40%, basal -mid inferior AK and inferolateral/anterolateral HK, RV normal, mild-moderate MR.   Patient had ICD shock in 4/23 due to VT, no prior chest pain or dyspnea.  Decision made to start amiodarone. She developed imbalance and had multiple falls after starting amiodarone, now off.   Echo was done today and reviewed, EF 40-45% with akinetic basal to mid inferior wall, normal RV, mild MR, IVC normal.   Today she returns for HF follow up. No further ICD discharges though she never started mexiletine, not sure why. Still smoking < 1 ppd.  She reports poor energy level/generalized weakness.  Though not very active, she denies significant exertional dyspnea or chest pain.   Weight has trended down.  Not lightheaded.  She is not getting significant claudication though as noted, she is not very active.   ECG (personally reviewed): NSR, LAFB, inferior and anterior Qs  [employer] device interrogation: Heartlogic score 5               Labs (4/21): K 3.7 => 3.4, creatinine 0.78 => 0.7, hgb 11.3 => 14.4 Labs (5/21): digoxin 0.6, K 3.5, creatinine 0.63 Labs (  6/21): K 4.1, creatinine 0.7, LDL 61, TGs 247 Labs (7/21): K 5, creatinine 0.76, hgb 16.9.  Labs (10/21): K 4.6, creatinine 0.86 Labs (11/21): digoxin 0.9, LDL 45, HDL 27, K 4, creatinine 0.86 Labs (4/22): hgb 14.4, K 4.9, creatinine 0.81, digoxin 0.7 Labs (9/22): hgb 14.5, K 4.9, creatinine 0.8, LDL 38 Labs (4/23): K 4.3, creatinine 0.56  Labs (7/23): hgb 11.1, K 4, creatinine 0.87 Labs (9/23): LDL 34, HDL 33, TGs 186 Labs (10/23): K 3.7, creatinine 0.70 Labs (1/24): LDL 30, TGs 186 Labs (5/24): K 3.8, creatinine 1.12, TSH  normal, Mg 1.7, hgb 14.9, LFTs normal Labs (7/24): K 4, creatinine 0.95 Labs (8/24): LDL 53, TGs 147 Labs (10/24): K 2.9, creatinine 1.45, transferrin saturation 10% with ferritin 10  PMH: 1. CAD: CABG 2007.  - LHC in 2007 showed all grafts closed except LIMA-LAD.  Patient had DES to LCx/OM.   - LHC 2016 showed occlusion of DES to LCx/OM with L-L and L-R collaterals.  - Not candidate for redo CABG with poor targets.  2. Chronic systolic CHF: Ischemic cardiomyopathy.   - Echo (2/21): EF 20-25%, moderately dilated and moderately dysfunctional RV, PASP 49, severe biatrial enlargement, moderate-severe MR, severe TR, mild-moderate AS, IVC dilated.  - RHC (4/21): mean RA 6, PA 41/16, mean 29, mean PCWP 15, CI 2.12, PVR 4.3 WU - CPX (5/21): peak VO2 13, VE/VCO2 slope 37, RER 1.06 => moderate HF limitation.  - Echo (11/21): EF 35%, mild LVH.  - RHC (11/21): baseline CI 1.8, exercise peak VO2 12.2, PCWP up to 24 with exercise.  - Echo (5/22): EF 45-50%, basal-mid inferolateral and anterolateral akinesis, normal RV.  - Echo (11/23): EF 40%, basal -mid inferior AK and inferolateral/anterolateral HK, RV normal, mild-moderate MR.  - Echo (11/24): EF 40-45% with akinetic basal to mid inferior wall, normal RV, mild MR, IVC normal.  3. Fe deficiency anemia: FOBT negative.  4. Carotid stenosis: Right carotid stent in 1/19.  - Carotid dopplers (7/20): 60-79% LICA stenosis.  - Carotid dopplers (6/21): 60-79% LICA stenosis.  - Carotid dopplers (6/22): 60-79% LICA stenosis.  - Carotid dopplers (10/23): 60-79% LICA stenosis, patent RICA stent.  - Carotid dopplers (10/24): 60-79% LICA stenosis 5. H/o CVA 6. HTN 7. Type 2 diabetes 8. Hyperlipidemia 9. Seizure disorder 10. OSA: Cannot tolerate CPAP.  11. Active smoker: PFTs in 2019 were relatively normal.  She does use oxygen at night.  - PFTs (5/21): FEV1 82%, FVC 80%, ratio 102% => minimal obstruction.  12. PAD: ABIs (4/21) 0.76 on right, 0.86 on left =>  significant right SFA stenosis.  - Atherectomy/angioplasty right SFA.  - Peripheral arterial dopplers (2/22): 50-74% stenosis right SFA.  - Peripheral arterial dopplers (6/24): 30-49% right CFA and SFA, occluded left SFA.  13. Chronic penetrating atherosclerotic ulcer descending thoracic aorta.  14. Small cell lung cancer: Diagnosed 12/22. Treated with carboplatin and etoposide, and radiation therapy. 15. Esophageal stricture with dilation 16. VT: 4/23 with ICD discharge.  - Did not tolerate amiodarone due to imbalance  Social History   Socioeconomic History   Marital status: Divorced    Spouse name: Not on file   Number of children: 0   Years of education: Not on file   Highest education level: Not on file  Occupational History   Occupation: disabled  Tobacco Use   Smoking status: Every Day    Current packs/day: 0.00    Average packs/day: 0.5 packs/day for 54.4 years (27.2 ttl pk-yrs)    Types:  Cigarettes    Start date: 5    Last attempt to quit: 02/22/2020    Years since quitting: 3.4   Smokeless tobacco: Never   Tobacco comments:    Pt smokes a 1 pack a day 08/30/2021 / pl  Vaping Use   Vaping status: Former  Substance and Sexual Activity   Alcohol use: No   Drug use: No   Sexual activity: Not Currently    Partners: Male    Birth control/protection: None  Other Topics Concern   Not on file  Social History Narrative   Right handed   Lives alone in a one story home   Social Determinants of Health   Financial Resource Strain: Low Risk  (12/31/2022)   Overall Financial Resource Strain (CARDIA)    Difficulty of Paying Living Expenses: Not very hard  Food Insecurity: No Food Insecurity (12/31/2022)   Hunger Vital Sign    Worried About Running Out of Food in the Last Year: Never true    Ran Out of Food in the Last Year: Never true  Transportation Needs: No Transportation Needs (12/31/2022)   PRAPARE - Administrator, Civil Service (Medical): No    Lack of  Transportation (Non-Medical): No  Physical Activity: Inactive (12/31/2022)   Exercise Vital Sign    Days of Exercise per Week: 0 days    Minutes of Exercise per Session: 0 min  Stress: No Stress Concern Present (12/31/2022)   [employer] of Occupational Health - Occupational Stress Questionnaire    Feeling of Stress : Not at all  Social Connections: Socially Isolated (12/31/2022)   Social Connection and Isolation Panel [NHANES]    Frequency of Communication with Friends and Family: More than three times a week    Frequency of Social Gatherings with Friends and Family: Twice a week    Attends Religious Services: Never    Database administrator or Organizations: No    Attends Banker Meetings: Never    Marital Status: Divorced  Catering manager Violence: Not At Risk (12/31/2022)   Humiliation, Afraid, Rape, and Kick questionnaire    Fear of Current or Ex-Partner: No    Emotionally Abused: No    Physically Abused: No    Sexually Abused: No   Family History  Problem Relation Age of Onset   Heart disease Mother    Diabetes Mother    COPD Mother    Hyperlipidemia Mother    Hypertension Mother    Cancer Father        met, origin unknown   Heart attack Father    Hypertension Sister    Cancer Sister        eyelid   Glaucoma Sister    Heart disease Maternal Grandfather    Drug abuse Paternal Grandmother    Stroke Paternal Grandfather    Heart disease Maternal Aunt        x 2 aunts   Lung cancer Paternal Aunt        lung with mets to brain   Melanoma Paternal Uncle    Lung cancer Paternal Uncle        lung/liver to brain   Cancer Paternal Uncle        cancer of unknown type   Colon cancer Neg Hx    Stomach cancer Neg Hx    Esophageal cancer Neg Hx    ROS: All systems reviewed and negative except as per HPI.   Current Outpatient Medications  Medication Sig  Dispense Refill   acetaminophen (TYLENOL) 500 MG tablet Take 1,000 mg by mouth at bedtime.     albuterol  (VENTOLIN HFA) 108 (90 Base) MCG/ACT inhaler INHALE 2 PUFFS EVERY 6 HOURS AS NEEDED FOR WHEEZING/SHORTNESS OF BREATH 18 each 0   aspirin EC 81 MG tablet Take 81 mg by mouth every evening.  30 tablet 6   BD PEN NEEDLE NANO 2ND GEN 32G X 4 MM MISC Inject into the skin 3 (three) times daily.     clopidogrel (PLAVIX) 75 MG tablet Take 1 tablet (75 mg total) by mouth daily. 90 tablet 3   digoxin (LANOXIN) 0.125 MG tablet Take 0.5 tablets (62.5 mcg total) by mouth daily. 45 tablet 3   fluticasone (FLONASE) 50 MCG/ACT nasal spray Place 2 sprays into both nostrils daily. 48 g 3   gabapentin (NEURONTIN) 300 MG capsule TAKE 2 CAPSULES DAILY AT   BEDTIME 180 capsule 3   glucose blood (ONETOUCH ULTRA) test strip 3 (three) times daily.     guaiFENesin (MUCINEX) 600 MG 12 hr tablet Take 2 tablets (1,200 mg total) by mouth 2 (two) times daily. 360 tablet 3   icosapent Ethyl (VASCEPA) 1 g capsule Take 1 g by mouth 2 (two) times daily.     insulin aspart protamine- aspart (NOVOLOG MIX 70/30) (70-30) 100 UNIT/ML injection Inject 40 Units into the skin daily with breakfast. 45 units in the PM     levETIRAcetam (KEPPRA) 1000 MG tablet Take 1 tablet (1,000 mg total) by mouth 2 (two) times daily. 180 tablet 3   Magnesium 400 MG TABS Take 400 mg by mouth 2 (two) times daily. 180 tablet 3   metFORMIN (GLUCOPHAGE) 500 MG tablet Take 500 mg by mouth 2 (two) times daily with a meal.     metolazone (ZAROXOLYN) 2.5 MG tablet TAKE 1 TABLET TWICE WEEKLY,EVERY MONDAY AND FRIDAY    (CHANGE IN DOSAGE). 26 tablet 3   nystatin (MYCOSTATIN/NYSTOP) powder APPLY 1 APPLICATION        TOPICALLY 4 TIMES A DAY AS NEEDED FOR YEAST INFECTIONS 60 g 5   ONE TOUCH ULTRA TEST test strip USE AS DIRECTED FOR TESTING BLOOD GLUCOSE 3 TIMES DAILY  1   pantoprazole (PROTONIX) 40 MG tablet Take 1 tablet (40 mg total) by mouth daily. 90 tablet 3   polyethylene glycol (MIRALAX / GLYCOLAX) 17 g packet Take 17 g by mouth daily.     potassium chloride  (MICRO-K) 10 MEQ CR capsule Take 2 capsules (20 mEq total) by mouth 2 (two) times daily. 360 capsule 3   ranolazine (RANEXA) 1000 MG SR tablet TAKE 1 TABLET TWICE A DAY 180 tablet 3   rosuvastatin (CRESTOR) 20 MG tablet Take 1 tablet (20 mg total) by mouth daily. 90 tablet 3   sodium chloride (OCEAN) 0.65 % SOLN nasal spray Place 1 spray into both nostrils as needed for congestion.     spironolactone (ALDACTONE) 25 MG tablet Take 1 tablet (25 mg total) by mouth at bedtime. 90 tablet 3   torsemide (DEMADEX) 20 MG tablet Take 4 tablets (80 mg total) by mouth every morning AND 3 tablets (60 mg total) every evening. 540 tablet 3   triamcinolone cream (KENALOG) 0.5 % Apply 1 application topically 3 (three) times daily as needed (rash). To affected areas     Vericiguat (VERQUVO) 10 MG TABS Take 1 tablet (10 mg total) by mouth daily. 90 tablet 3   ezetimibe (ZETIA) 10 MG tablet Take 1 tablet (10 mg  total) by mouth daily. 90 tablet 3   HYDROcodone-acetaminophen (NORCO) 5-325 MG tablet Take 1 tablet by mouth every 6 (six) hours as needed for moderate pain. (Patient not taking: Reported on 08/20/2023) 30 tablet 0   mexiletine (MEXITIL) 150 MG capsule Take 1 capsule (150 mg total) by mouth 2 (two) times daily. 180 capsule 3   nitroGLYCERIN (NITROLINGUAL) 0.4 MG/SPRAY spray Place 1 spray under the tongue every 5 (five) minutes x 3 doses as needed for chest pain. 12 g 3   No current facility-administered medications for this encounter.   Wt Readings from Last 3 Encounters:  08/20/23 49.8 kg (109 lb 12.8 oz)  08/05/23 50.5 kg (111 lb 4.8 oz)  05/20/23 52.4 kg (115 lb 9.6 oz)   BP 110/70   Pulse 87   Wt 49.8 kg (109 lb 12.8 oz)   LMP 09/02/2003   SpO2 98%   BMI 23.76 kg/m  General: NAD Neck: No JVD, no thyromegaly or thyroid nodule.  Lungs: Clear to auscultation bilaterally with normal respiratory effort. CV: Nondisplaced PMI.  Heart regular S1/S2, no S3/S4, no murmur.  No peripheral edema.  No  carotid bruit.  Unable to palpate pedal pulses.  Abdomen: Soft, nontender, no hepatosplenomegaly, no distention.  Skin: Intact without lesions or rashes.  Neurologic: Alert and oriented x 3.  Psych: Normal affect. Extremities: No clubbing or cyanosis.  HEENT: Normal.   Assessment/Plan:  1. Chronic systolic CHF: Ischemic cardiomyopathy, [employer] ICD.  Echo in 2/21 showed EF 20-25%, moderately dilated and moderately dysfunctional RV, PASP 49, severe biatrial enlargement, moderate-severe MR, severe TR, mild-moderate AS, IVC dilated. CPX (5/21) with moderate HF limitation, RHC (4/21) with CI low at 2.12.  Medication titration has been severely limited by hypotension and orthostatic symptoms.  Not a transplant candidate due to smoking. She was seen by cardiac surgery at [facility] and not thought to be good LVAD candidate due to small stature, small LV cavity, and redo sternotomy.  I sent her for a 2nd opinion at [facility].  She had echo there in 11/21 with EF higher at 35% but CI low on RHC at 1.8, no LVAD planned based on higher EF.  Echo in 5/22 showed EF 45-50%.  Echo in 11/23 with EF 40%, normal RV.  Echo today with EF 40-45% with akinetic basal to mid inferior wall, normal RV, mild MR, IVC normal.  NYHA III symptoms chronically due to prominent fatigue.  She is not volume overloaded by exam or [employer].   - Continue torsemide 80 qam/60 qpm.  BMET/BNP today.  - Continue vericiguat 10 mg daily.  - Continue metolazone 2.5 mg every Monday and Friday. - Continue spironolactone 25 mg daily.  - Continue digoxin 0.0625 mg daily. Check digoxin level today.  - She has not tolerated BP- active meds well, avoid Coreg and Entresto for now.  She became hypotensive with losartan.  - She did not tolerate SGLT2 inhibitor due to recurrent GU yeast infections.  - She has had hypotension with beta blockers but may try low dose bisoprolol in the future (will not add today as we still need to start mexiletine).    2. CAD: s/p CABG 2007 complicated by early graft closure.  Repeat cath in 2007 showed only LIMA-LAD still open and she had DES to LCX/OM.  Cath in 2016 showed this stent was occluded.  She has not had targets for redo CABG and she does not have good interventional targets. No recent chest pain. - Continue ASA  81, Plavix 75 mg daily.  - Continue ranolazone 1000 bid.   - Continue Crestor 20 mg daily + Zetia 10 mg daily, and Vascepa. Good lipids 8/24. 3. Smoking: Still smoking about 1/2 ppd.  She is trying to quit.  - Does not want Chantix.  4. PAD: s/p atherectomy/angioplasty right SFA in 7/21. Has leg pain and weakness chronically. Peripheral arterial dopplers in 6/24 with occluded left SFA.  No rest pain or pedal ulcers.  - Follows with Dr. Allyson Sabal for [facility], has appt in January.  5. Carotid stenosis: Stable moderate LICA stenosis with patent right carotid stent.  - Needs carotid dopplers 10/25.      6. Fe deficiency anemia: Has had IV Fe in the past. This may contribute to fatigue.  Recent transferrin saturation 10%.  - I will arrange for IV Fe.  7. Small cell lung cancer: Finished chemo and radiation.  - She is followed by Dr. Arbutus Ped. 8. VT: ICD discharge in 4/24.  No chest pain or CHF exacerbation.  Suspect scar-mediated.  She was started on amiodarone but had to stop due to severe imbalance and falls, now resolved.  - I will start her on mexiletine 150 mg bid (was supposed to start last appt but med never went to the pharmacy).   Followup 4 months with APP.   Marca Ancona  08/21/2023

## 2023-09-04 ENCOUNTER — Encounter: Payer: Self-pay | Admitting: Cardiology

## 2023-09-16 ENCOUNTER — Inpatient Hospital Stay: Payer: Medicare Other | Attending: Internal Medicine

## 2023-09-16 VITALS — BP 126/77 | HR 96 | Temp 97.7°F | Resp 16

## 2023-09-16 DIAGNOSIS — Z452 Encounter for adjustment and management of vascular access device: Secondary | ICD-10-CM | POA: Diagnosis not present

## 2023-09-16 DIAGNOSIS — Z85118 Personal history of other malignant neoplasm of bronchus and lung: Secondary | ICD-10-CM | POA: Diagnosis not present

## 2023-09-16 DIAGNOSIS — D509 Iron deficiency anemia, unspecified: Secondary | ICD-10-CM

## 2023-09-16 DIAGNOSIS — Z95828 Presence of other vascular implants and grafts: Secondary | ICD-10-CM

## 2023-09-16 MED ORDER — SODIUM CHLORIDE 0.9% FLUSH
10.0000 mL | Freq: Once | INTRAVENOUS | Status: AC
Start: 2023-09-16 — End: 2023-09-16
  Administered 2023-09-16: 10 mL

## 2023-09-16 MED ORDER — HEPARIN SOD (PORK) LOCK FLUSH 100 UNIT/ML IV SOLN
500.0000 [IU] | Freq: Once | INTRAVENOUS | Status: AC
Start: 2023-09-16 — End: 2023-09-16
  Administered 2023-09-16: 500 [IU]

## 2023-09-30 ENCOUNTER — Other Ambulatory Visit (HOSPITAL_COMMUNITY): Payer: Self-pay

## 2023-09-30 DIAGNOSIS — I5022 Chronic systolic (congestive) heart failure: Secondary | ICD-10-CM

## 2023-09-30 MED ORDER — METOLAZONE 2.5 MG PO TABS: ORAL_TABLET | ORAL | 3 refills | Status: DC

## 2023-10-15 ENCOUNTER — Ambulatory Visit: Payer: Medicare Other | Attending: Cardiovascular Disease | Admitting: Cardiovascular Disease

## 2023-10-15 ENCOUNTER — Encounter: Payer: Self-pay | Admitting: Cardiovascular Disease

## 2023-10-15 VITALS — BP 110/60 | HR 89 | Ht <= 58 in | Wt 105.0 lb

## 2023-10-15 DIAGNOSIS — I7090 Unspecified atherosclerosis: Secondary | ICD-10-CM | POA: Diagnosis not present

## 2023-10-15 NOTE — Assessment & Plan Note (Signed)
 History of PAD status post right SFA intervention 04/18/2020.  I performed [facility] 1 directional atherectomy followed by drug-coated balloon angioplasty of segmental high-grade right SFA stenosis with three-vessel runoff.  I had an excellent angiographic result.  Her Dopplers performed 04/19/2020 showed mild residual disease in her proximal right SFA but her ABIs increased from 0.76 up to 0.89.  She tells me that she really had no significant improvement after her intervention.  She is referred back to me because of her most recent Doppler studies performed 03/18/2023 revealing a right ABI of 0.81 and left of 0.54.  Her right SFA has remained patent although her left SFA is occluded.  She is minimally ambulatory and really denies claudication type symptoms.

## 2023-10-15 NOTE — Progress Notes (Signed)
 10/15/2023 [name]   [birth year]  [id number]  Primary Physician Tower, Laine LABOR, MD Primary Cardiologist: Dorn JINNY Lesches MD [place], [place]  HPI:  Diane [name] is a 68 y.o. divorced Caucasian female with no children whose mother, Diane [name], was also a patient of mine.  She has a history of CAD status post coronary artery bypass grafting March 2007 by Dr. Kerrin. I last saw her in the office  05/03/2020.. She's had subsequent stenting and known occluded grafts except for her her LIMA. She has severe LV dysfunction with moderate pulmonary hypertension. I last saw her in the office 06/20/16. Her mother, Diane [name], was a long-term patient of mine as well. Her other Problems include diabetes, hypokalemia, hypertension and ongoing tobacco abuse. She's had progression of her bilateral carotid artery stenosis left greater than right . She is neurologically asymptomatic. She is high risk for carotid endarterectomy. I performed cerebral angiography and her 11/15/15 revealing 70% bilateral ICA stenosis with pins beyond the stenosis and significant calcification on the left making  stenting not appropriate. Her carotid Dopplers performed on 06/26/16 showed stable bilateral moderate ICA stenosis and lower extremity Dopplers performed 12/12/16 revealed ABIs 0.93 bilaterally with moderate but not severe disease. She does complain of some leg pain which has some atypical characteristics for claudication   She does continue to smoke 1 pack/day.  She has ischemic cardiomyopathy with an EF of 20% range followed by Dr. McClain.  Her last cath in 2016 by Dr. Jordan revealed occluded grafts with poor targets for redo CABG.  I did perform carotid stenting on her with Dr. Serene 10/10/2017.  She has less tolerating claudication with ABIs in the 0.7-0.8 range bilaterally.  She has high-frequency signal in her proximal right SFA.   She apparently was turned down for LVAD therapy because her heart  was too small.  She is compensated.  She wishes to proceed with right SFA intervention for lifestyle limiting claudication.   I performed peripheral intervention on her 04/18/2020.  She had [facility] one directional atherectomy followed by drug-coated balloon angioplasty of high-grade segmental proximal right SFA stenosis with three-vessel runoff.  She had excellent angiographic result.  Her Dopplers performed on 04/19/2020 showed residual mild increased frequency in her proximal right SFA with ABIs increased from 0.76-0.89.  She does complain of some pain in her groin and knee.  Is unclear to me why her claudication has not improved however.  She does continue to smoke 1 pack/day.   She did undergo ICD implantation by Dr. Cindie for primary prevention 07/15/2020.    Since I saw her 3 years ago she has been diagnosed with small cell lung cancer and underwent chemotherapy and radiation therapy successfully.  LV function is improved on GDMT.  She apparently had an ICD discharge which she was unaware of.  She is minimally ambulatory and denies claudication.  Her most recent Doppler studies performed 03/18/2023 revealed a right ABI of 0.81 and a left of 0.54.  Her right SFA appears to have remained patent but her left SFA is occluded.   Current Meds  Medication Sig   acetaminophen  (TYLENOL ) 500 MG tablet Take 1,000 mg by mouth at bedtime.   albuterol  (VENTOLIN  HFA) 108 (90 Base) MCG/ACT inhaler INHALE 2 PUFFS EVERY 6 HOURS AS NEEDED FOR WHEEZING/SHORTNESS OF BREATH   aspirin  EC 81 MG tablet Take 81 mg by mouth every evening.    BD PEN NEEDLE NANO 2ND GEN 32G X  4 MM MISC Inject into the skin 3 (three) times daily.   clopidogrel  (PLAVIX ) 75 MG tablet Take 1 tablet (75 mg total) by mouth daily.   digoxin  (LANOXIN ) 0.125 MG tablet Take 0.5 tablets (62.5 mcg total) by mouth daily.   ezetimibe  (ZETIA ) 10 MG tablet Take 1 tablet (10 mg total) by mouth daily.   fluticasone  (FLONASE ) 50 MCG/ACT nasal spray Place 2  sprays into both nostrils daily.   gabapentin  (NEURONTIN ) 300 MG capsule TAKE 2 CAPSULES DAILY AT   BEDTIME   glucose blood (ONETOUCH ULTRA) test strip 3 (three) times daily.   guaiFENesin  (MUCINEX ) 600 MG 12 hr tablet Take 2 tablets (1,200 mg total) by mouth 2 (two) times daily.   HUMULIN 70/30 KWIKPEN (70-30) 100 UNIT/ML KwikPen SMARTSIG:55 Unit(s) SUB-Q Twice Daily   HYDROcodone -acetaminophen  (NORCO) 5-325 MG tablet Take 1 tablet by mouth every 6 (six) hours as needed for moderate pain.   icosapent  Ethyl (VASCEPA ) 1 g capsule Take 1 g by mouth 2 (two) times daily.   levETIRAcetam  (KEPPRA ) 1000 MG tablet Take 1 tablet (1,000 mg total) by mouth 2 (two) times daily.   Magnesium  400 MG TABS Take 400 mg by mouth 2 (two) times daily.   metFORMIN  (GLUCOPHAGE ) 500 MG tablet Take 500 mg by mouth 2 (two) times daily with a meal.   metolazone  (ZAROXOLYN ) 2.5 MG tablet TAKE 1 TABLET TWICE WEEKLY,EVERY MONDAY AND FRIDAY    (CHANGE IN DOSAGE).   mexiletine (MEXITIL) 150 MG capsule Take 1 capsule (150 mg total) by mouth 2 (two) times daily.   nitroGLYCERIN  (NITROLINGUAL ) 0.4 MG/SPRAY spray Place 1 spray under the tongue every 5 (five) minutes x 3 doses as needed for chest pain.   nystatin  (MYCOSTATIN /NYSTOP ) powder APPLY 1 APPLICATION        TOPICALLY 4 TIMES A DAY AS NEEDED FOR YEAST INFECTIONS   ONE TOUCH ULTRA TEST test strip USE AS DIRECTED FOR TESTING BLOOD GLUCOSE 3 TIMES DAILY   pantoprazole  (PROTONIX ) 40 MG tablet Take 1 tablet (40 mg total) by mouth daily.   polyethylene glycol (MIRALAX  / GLYCOLAX ) 17 g packet Take 17 g by mouth daily.   potassium chloride  (MICRO-K ) 10 MEQ CR capsule Take 2 capsules (20 mEq total) by mouth 2 (two) times daily.   ranolazine  (RANEXA ) 1000 MG SR tablet TAKE 1 TABLET TWICE A DAY   rosuvastatin  (CRESTOR ) 20 MG tablet Take 1 tablet (20 mg total) by mouth daily.   sodium chloride  (OCEAN) 0.65 % SOLN nasal spray Place 1 spray into both nostrils as needed for congestion.    spironolactone  (ALDACTONE ) 25 MG tablet Take 1 tablet (25 mg total) by mouth at bedtime.   torsemide  (DEMADEX ) 20 MG tablet Take 4 tablets (80 mg total) by mouth every morning AND 3 tablets (60 mg total) every evening.   triamcinolone  cream (KENALOG ) 0.5 % Apply 1 application topically 3 (three) times daily as needed (rash). To affected areas   Vericiguat  (VERQUVO ) 10 MG TABS Take 1 tablet (10 mg total) by mouth daily.     Allergies  Allergen Reactions   Bee Venom Itching and Swelling   Benadryl  [Diphenhydramine ] Other (See Comments)    Insomnia    Entresto  [Sacubitril -Valsartan ] Cough   Farxiga  [Dapagliflozin ]     Yeast infection   Tetracycline     Unknown, pt cannot recall exact reaction   Canagliflozin Other (See Comments)   Sitagliptin Other (See Comments)   Ace Inhibitors Cough    Social History   Socioeconomic History  Marital status: Divorced    Spouse name: Not on file   Number of children: 0   Years of education: Not on file   Highest education level: Not on file  Occupational History   Occupation: disabled  Tobacco Use   Smoking status: Every Day    Current packs/day: 0.00    Average packs/day: 0.5 packs/day for 54.4 years (27.2 ttl pk-yrs)    Types: Cigarettes    Start date: 79    Last attempt to quit: 02/22/2020    Years since quitting: 3.6   Smokeless tobacco: Never   Tobacco comments:    Pt smokes a 1 pack a day 08/30/2021 / pl  Vaping Use   Vaping status: Former  Substance and Sexual Activity   Alcohol use: No   Drug use: No   Sexual activity: Not Currently    Partners: Male    Birth control/protection: None  Other Topics Concern   Not on file  Social History Narrative   Right handed   Lives alone in a one story home   Social Drivers of Health   Financial Resource Strain: Low Risk  (12/31/2022)   Overall Financial Resource Strain (CARDIA)    Difficulty of Paying Living Expenses: Not very hard  Food Insecurity: No Food Insecurity (12/31/2022)    Hunger Vital Sign    Worried About Running Out of Food in the Last Year: Never true    Ran Out of Food in the Last Year: Never true  Transportation Needs: No Transportation Needs (12/31/2022)   PRAPARE - Administrator, Civil Service (Medical): No    Lack of Transportation (Non-Medical): No  Physical Activity: Inactive (12/31/2022)   Exercise Vital Sign    Days of Exercise per Week: 0 days    Minutes of Exercise per Session: 0 min  Stress: No Stress Concern Present (12/31/2022)   [employer] of Occupational Health - Occupational Stress Questionnaire    Feeling of Stress : Not at all  Social Connections: Socially Isolated (12/31/2022)   Social Connection and Isolation Panel [NHANES]    Frequency of Communication with Friends and Family: More than three times a week    Frequency of Social Gatherings with Friends and Family: Twice a week    Attends Religious Services: Never    Database Administrator or Organizations: No    Attends Banker Meetings: Never    Marital Status: Divorced  Catering Manager Violence: Not At Risk (12/31/2022)   Humiliation, Afraid, Rape, and Kick questionnaire    Fear of Current or Ex-Partner: No    Emotionally Abused: No    Physically Abused: No    Sexually Abused: No     Review of Systems: General: negative for chills, fever, night sweats or weight changes.  Cardiovascular: negative for chest pain, dyspnea on exertion, edema, orthopnea, palpitations, paroxysmal nocturnal dyspnea or shortness of breath Dermatological: negative for rash Respiratory: negative for cough or wheezing Urologic: negative for hematuria Abdominal: negative for nausea, vomiting, diarrhea, bright red blood per rectum, melena, or hematemesis Neurologic: negative for visual changes, syncope, or dizziness All other systems reviewed and are otherwise negative except as noted above.    Blood pressure 110/60, pulse 89, height 4' 9 (1.448 m), weight 105 lb (47.6  kg), last menstrual period 09/02/2003, SpO2 97%.  General appearance: alert and no distress Neck: no adenopathy, no carotid bruit, no JVD, supple, symmetrical, trachea midline, and thyroid  not enlarged, symmetric, no tenderness/mass/nodules Lungs: clear to auscultation  bilaterally Heart: regular rate and rhythm, S1, S2 normal, no murmur, click, rub or gallop Extremities: extremities normal, atraumatic, no cyanosis or edema Pulses: 2+ and symmetric Skin: Skin color, texture, turgor normal. No rashes or lesions Neurologic: Grossly normal  EKG not performed today      ASSESSMENT AND PLAN:   Atherosclerotic vascular disease History of PAD status post right SFA intervention 04/18/2020.  I performed [facility] 1 directional atherectomy followed by drug-coated balloon angioplasty of segmental high-grade right SFA stenosis with three-vessel runoff.  I had an excellent angiographic result.  Her Dopplers performed 04/19/2020 showed mild residual disease in her proximal right SFA but her ABIs increased from 0.76 up to 0.89.  She tells me that she really had no significant improvement after her intervention.  She is referred back to me because of her most recent Doppler studies performed 03/18/2023 revealing a right ABI of 0.81 and left of 0.54.  Her right SFA has remained patent although her left SFA is occluded.  She is minimally ambulatory and really denies claudication type symptoms.     Dorn DOROTHA Lesches MD FACP,FACC,FAHA, [facility] 10/15/2023 4:33 PM

## 2023-10-15 NOTE — Patient Instructions (Signed)
 Medication Instructions:  Your physician recommends that you continue on your current medications as directed. Please refer to the Current Medication list given to you today.  *If you need a refill on your cardiac medications before your next appointment, please call your pharmacy*   Follow-Up: At [facility], you and your health needs are our priority.  As part of our continuing mission to provide you with exceptional heart care, we have created designated Provider Care Teams.  These Care Teams include your primary Cardiologist (physician) and Advanced Practice Providers (APPs -  Physician Assistants and Nurse Practitioners) who all work together to provide you with the care you need, when you need it.  We recommend signing up for the patient portal called "MyChart".  Sign up information is provided on this After Visit Summary.  MyChart is used to connect with patients for Virtual Visits (Telemedicine).  Patients are able to view lab/test results, encounter notes, upcoming appointments, etc.  Non-urgent messages can be sent to your provider as well.   To learn more about what you can do with MyChart, go to [email or web address].    Your next appointment:   We will see you on an as needed basis  Provider:   Nanetta Batty, MD     Other Instructions

## 2023-10-17 ENCOUNTER — Telehealth: Payer: Self-pay | Admitting: Cardiovascular Disease

## 2023-10-17 NOTE — Telephone Encounter (Signed)
Pt would like a c/b in regards to her Carotid Doppler. Please advise

## 2023-10-17 NOTE — Telephone Encounter (Signed)
Called and spoke to patient. Verified name and DOB.Below results relayed to patient again. Patient verbalized understanding and agree.    Laurey Morale, MD 06/13/2023  3:47 PM EDT   60-79% LICA stenosis.  Repeat study in 1 year.

## 2023-10-18 ENCOUNTER — Other Ambulatory Visit (HOSPITAL_COMMUNITY): Payer: Self-pay | Admitting: Cardiology

## 2023-10-22 ENCOUNTER — Ambulatory Visit (INDEPENDENT_AMBULATORY_CARE_PROVIDER_SITE_OTHER): Payer: Medicare Other

## 2023-10-22 DIAGNOSIS — I255 Ischemic cardiomyopathy: Secondary | ICD-10-CM

## 2023-10-22 DIAGNOSIS — I5022 Chronic systolic (congestive) heart failure: Secondary | ICD-10-CM

## 2023-10-22 LAB — CUP PACEART REMOTE DEVICE CHECK
Battery Remaining Longevity: 144 mo
Battery Remaining Percentage: 100 %
Brady Statistic RV Percent Paced: 0 %
Date Time Interrogation Session: 20250121041100
HighPow Impedance: 89 Ohm
Implantable Lead Connection Status: 753985
Implantable Lead Implant Date: 20211015
Implantable Lead Location: 753860
Implantable Lead Model: 672
Implantable Lead Serial Number: 166771
Implantable Pulse Generator Implant Date: 20211015
Lead Channel Impedance Value: 1070 Ohm
Lead Channel Setting Pacing Amplitude: 3.5 V
Lead Channel Setting Pacing Pulse Width: 0.4 ms
Lead Channel Setting Sensing Sensitivity: 0.5 mV
Pulse Gen Serial Number: 279742
Zone Setting Status: 755011

## 2023-10-23 ENCOUNTER — Encounter: Payer: Self-pay | Admitting: Cardiology

## 2023-10-28 ENCOUNTER — Inpatient Hospital Stay: Payer: Medicare Other | Attending: Internal Medicine

## 2023-10-28 ENCOUNTER — Inpatient Hospital Stay: Payer: Medicare Other

## 2023-10-28 DIAGNOSIS — Z452 Encounter for adjustment and management of vascular access device: Secondary | ICD-10-CM | POA: Diagnosis not present

## 2023-10-28 DIAGNOSIS — Z85118 Personal history of other malignant neoplasm of bronchus and lung: Secondary | ICD-10-CM | POA: Diagnosis not present

## 2023-10-28 DIAGNOSIS — D509 Iron deficiency anemia, unspecified: Secondary | ICD-10-CM

## 2023-10-28 DIAGNOSIS — Z95828 Presence of other vascular implants and grafts: Secondary | ICD-10-CM

## 2023-10-28 MED ORDER — HEPARIN SOD (PORK) LOCK FLUSH 100 UNIT/ML IV SOLN
500.0000 [IU] | Freq: Once | INTRAVENOUS | Status: AC
  Administered 2023-10-28: 500 [IU]

## 2023-10-28 MED ORDER — SODIUM CHLORIDE 0.9% FLUSH
10.0000 mL | Freq: Once | INTRAVENOUS | Status: AC
  Administered 2023-10-28: 10 mL

## 2023-11-05 ENCOUNTER — Ambulatory Visit: Payer: Medicare Other | Admitting: Family Medicine

## 2023-11-05 ENCOUNTER — Encounter: Payer: Self-pay | Admitting: Family Medicine

## 2023-11-05 VITALS — BP 126/62 | HR 98 | Temp 97.6°F | Ht <= 58 in | Wt 107.1 lb

## 2023-11-05 DIAGNOSIS — G40209 Localization-related (focal) (partial) symptomatic epilepsy and epileptic syndromes with complex partial seizures, not intractable, without status epilepticus: Secondary | ICD-10-CM

## 2023-11-05 DIAGNOSIS — Z599 Problem related to housing and economic circumstances, unspecified: Secondary | ICD-10-CM | POA: Diagnosis not present

## 2023-11-05 DIAGNOSIS — Z1211 Encounter for screening for malignant neoplasm of colon: Secondary | ICD-10-CM

## 2023-11-05 DIAGNOSIS — Z23 Encounter for immunization: Secondary | ICD-10-CM | POA: Diagnosis not present

## 2023-11-05 DIAGNOSIS — Z1231 Encounter for screening mammogram for malignant neoplasm of breast: Secondary | ICD-10-CM

## 2023-11-05 DIAGNOSIS — E785 Hyperlipidemia, unspecified: Secondary | ICD-10-CM | POA: Diagnosis not present

## 2023-11-05 DIAGNOSIS — E1142 Type 2 diabetes mellitus with diabetic polyneuropathy: Secondary | ICD-10-CM | POA: Diagnosis not present

## 2023-11-05 DIAGNOSIS — C3490 Malignant neoplasm of unspecified part of unspecified bronchus or lung: Secondary | ICD-10-CM

## 2023-11-05 DIAGNOSIS — E2839 Other primary ovarian failure: Secondary | ICD-10-CM

## 2023-11-05 DIAGNOSIS — I10 Essential (primary) hypertension: Secondary | ICD-10-CM | POA: Diagnosis not present

## 2023-11-05 DIAGNOSIS — F17200 Nicotine dependence, unspecified, uncomplicated: Secondary | ICD-10-CM | POA: Diagnosis not present

## 2023-11-05 DIAGNOSIS — E049 Nontoxic goiter, unspecified: Secondary | ICD-10-CM

## 2023-11-05 DIAGNOSIS — J410 Simple chronic bronchitis: Secondary | ICD-10-CM

## 2023-11-05 DIAGNOSIS — E1169 Type 2 diabetes mellitus with other specified complication: Secondary | ICD-10-CM

## 2023-11-05 DIAGNOSIS — Z794 Long term (current) use of insulin: Secondary | ICD-10-CM

## 2023-11-05 DIAGNOSIS — E538 Deficiency of other specified B group vitamins: Secondary | ICD-10-CM | POA: Diagnosis not present

## 2023-11-05 DIAGNOSIS — Z79899 Other long term (current) drug therapy: Secondary | ICD-10-CM

## 2023-11-05 DIAGNOSIS — K219 Gastro-esophageal reflux disease without esophagitis: Secondary | ICD-10-CM | POA: Diagnosis not present

## 2023-11-05 DIAGNOSIS — I5022 Chronic systolic (congestive) heart failure: Secondary | ICD-10-CM

## 2023-11-05 DIAGNOSIS — D509 Iron deficiency anemia, unspecified: Secondary | ICD-10-CM | POA: Diagnosis not present

## 2023-11-05 DIAGNOSIS — R569 Unspecified convulsions: Secondary | ICD-10-CM

## 2023-11-05 DIAGNOSIS — M8589 Other specified disorders of bone density and structure, multiple sites: Secondary | ICD-10-CM

## 2023-11-05 DIAGNOSIS — Z8619 Personal history of other infectious and parasitic diseases: Secondary | ICD-10-CM

## 2023-11-05 DIAGNOSIS — J9611 Chronic respiratory failure with hypoxia: Secondary | ICD-10-CM

## 2023-11-05 DIAGNOSIS — Z95811 Presence of heart assist device: Secondary | ICD-10-CM

## 2023-11-05 DIAGNOSIS — R531 Weakness: Secondary | ICD-10-CM

## 2023-11-05 MED ORDER — PANTOPRAZOLE SODIUM 40 MG PO TBEC: 40.0000 mg | DELAYED_RELEASE_TABLET | Freq: Every day | ORAL | 3 refills | Status: AC

## 2023-11-05 NOTE — Progress Notes (Signed)
 Subjective:    Patient ID: Diane [name], female    DOB: 03/03/1956, 68 y.o.   MRN: [id number]  HPI  Here for annual follow up of chronic health problems   Pt has amw scheduled in march    Wt Readings from Last 3 Encounters:  11/05/23 107 lb 2 oz (48.6 kg)  10/15/23 105 lb (47.6 kg)  08/20/23 109 lb 12.8 oz (49.8 kg)   22.78 kg/m  Vitals:   11/05/23 1528  BP: 126/62  Pulse: 98  Temp: 97.6 F (36.4 C)  SpO2: 98%    Immunization History  Administered Date(s) Administered   Fluad Quad(high Dose 65+) 06/23/2019, 06/07/2020, 06/26/2021, 10/03/2022   Fluad Trivalent(High Dose 65+) 11/05/2023   Influenza Whole 06/01/2012   Influenza,inj,Quad PF,6+ Mos 06/16/2013, 06/16/2014, 06/21/2015, 08/13/2016, 10/11/2017, 07/15/2018   Moderna Sars-Covid-2 Vaccination 12/31/2019, 01/30/2020   PNEUMOCOCCAL CONJUGATE-20 11/05/2023   Pneumococcal Conjugate-13 05/11/2016   Pneumococcal Polysaccharide-23 11/18/1998, 11/04/2008, 06/16/2014, 06/23/2019   Td 10/01/1996, 11/15/2009    Health Maintenance Due  Topic Date Due   Diabetic kidney evaluation - Urine ACR  06/09/2014   HEMOGLOBIN A1C  08/26/2020   OPHTHALMOLOGY EXAM  09/04/2022   Medicare Annual Wellness (AWV)  10/23/2023   Sent for copy of eye exam   Flu shot -wants to do today  Also update prevnar 20 due to high risk   Did have RSV vaccine last year   Shingrix -interested   Mammogram 10/2020 at the [facility] center  Self breast exam- no more lumpy than usual   Gyn health Gyn care  Has had colposcopy for HPV in past  Needs a follow up    Colon cancer screening -colonoscopy 07/2017  10 y recall  Bone health  Dexa 08/2021 -osteopenia  Falls-during chemotherapy / and also when she was on a new cardiology med (does not remember what)- is off of it now  Fractures-had rib and tib fib fracture when she was taking chemotherapy  Supplements - cannot afford vitamin D  Last vitamin D   Exercise  Gets around with walker   Very limited - due to chronic weakness since stroke and off balance    Mood    12/31/2022   11:58 AM 10/22/2022    2:17 PM 10/03/2022    4:31 PM 06/26/2021    2:07 PM 09/13/2020    3:03 PM  Depression screen PHQ 2/9  Decreased Interest 0 0 0 0 0  Down, Depressed, Hopeless 0 0 0 0 0  PHQ - 2 Score 0 0 0 0 0  Altered sleeping  0 2  1  Tired, decreased energy  0 3  1  Change in appetite  0 0  0  Feeling bad or failure about yourself   0 0  0  Trouble concentrating  0 0  0  Moving slowly or fidgety/restless  0 0  0  Suicidal thoughts  0 0  0  PHQ-9 Score  0 5  2   HTN bp is stable today  No cp or palpitations or headaches or edema  No side effects to medicines  BP Readings from Last 3 Encounters:  11/05/23 126/62  10/15/23 110/60  09/16/23 126/77    Sees cardiology  Metolazone  2.5 mg twice weekly Torsemide  80 mg am and 60 mg pm  Spironolactone  25 mg at bedtime   CAD CHF Carotid dz   Lab Results  Component Value Date   NA 135 08/20/2023   K 4.1 08/20/2023  CO2 28 08/20/2023   GLUCOSE 72 08/20/2023   BUN 19 08/20/2023   CREATININE 1.15 (H) 08/20/2023   CALCIUM  9.9 08/20/2023   GFR 76.78 10/03/2022   EGFR 57 (L) 03/26/2023   GFRNONAA 52 (L) 08/20/2023    Hyperlipidemia Lab Results  Component Value Date   CHOL 122 05/20/2023   HDL 40 (L) 05/20/2023   LDLCALC 53 05/20/2023   LDLDIRECT 76.0 04/11/2017   TRIG 147 05/20/2023   CHOLHDL 3.1 05/20/2023  Crestor  20 mg daily  Vascepa  1 g bid  Zetia  10 mg daily    Sees pulmonary for  OSA Copd  Smoker's cough  Lung cancer (small cell, in remission)   GERD Takes protonix  40 mg -cannot go without it   Sees endocrinology for goiter and dm2 Lab Results  Component Value Date   TSH 0.899 08/20/2023   Last A1c 6.5    B12 def Lab Results  Component Value Date   VITAMINB12 283 10/03/2022   Oral supplement - can no longer afford   Sees neuro for seizure disorder  On keppra  - no seizures in the past year      Getting iron  infusions for iron  def  Last infusion was a while ago  Lab Results  Component Value Date   WBC 6.1 08/20/2023   HGB 15.6 (H) 08/20/2023   HCT 46.9 (H) 08/20/2023   MCV 87.8 08/20/2023   PLT 237 08/20/2023   Lab Results  Component Value Date   IRON  62 07/31/2023   TIBC 605 (H) 07/31/2023   FERRITIN 10 (L) 07/31/2023       Patient Active Problem List   Diagnosis Date Noted   Current use of proton pump inhibitor 11/05/2023   Presence of heart assist device (HCC) 11/05/2023   Insulin  dependent type 2 diabetes mellitus (HCC) 02/02/2022   Port-A-Cath in place 01/23/2022   Encounter for antineoplastic chemotherapy 11/06/2021   Primary small cell carcinoma of right lung (HCC) 11/06/2021   Malignant neoplasm of unspecified part of unspecified bronchus or lung (HCC) 10/04/2021   Pulmonary nodules/lesions, multiple 09/18/2021   Hilar adenopathy 09/18/2021   S/P bronchoscopy with biopsy    Financial difficulty 06/26/2021   Papules 05/01/2021   ASCUS with positive high risk HPV cervical 06/14/2020   Chronic pain of right lower extremity 05/16/2020   Right knee pain 05/16/2020   Right groin pain 05/16/2020   Atherosclerotic vascular disease 03/03/2020   Healthcare maintenance 12/25/2019   Shortness of breath 12/25/2019   Odynophagia 12/09/2019   Iron  deficiency anemia 11/05/2019   Coronary artery disease of bypass graft of native heart with stable angina pectoris (HCC) 11/02/2019   Generalized weakness 03/24/2019   Occult blood in stools    Positive colorectal cancer screening using DNA-based stool test 12/22/2018   Fatigue 12/05/2018   Estrogen deficiency 04/21/2018   Screening mammogram, encounter for 04/21/2018   Osteopenia 04/21/2018   History of HPV infection 04/21/2018   Dizzy spells 03/03/2018   Ischemic cardiomyopathy 01/21/2018   Other constipation 05/07/2017   Colon cancer screening 04/19/2017   Incisional hernia 01/14/2017   Smokers' cough  (HCC) 08/13/2016   Chronic respiratory failure (HCC) 07/11/2016   Sleep-related hypoventilation due to lower airway obstruction 05/20/2016   Periodic limb movements of sleep 05/20/2016   Chronic systolic congestive heart failure (HCC) 05/08/2016   Type 2 diabetes mellitus with diabetic polyneuropathy, with long-term current use of insulin  (HCC) 12/13/2015   B12 deficiency 12/01/2015   History of CVA (cerebrovascular accident) 11/29/2015  Abnormal nuclear stress test 05/06/2015   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 of localized onset, not intractable, without status epilepticus (HCC) 03/22/2015   Cervical disc disorder with radiculopathy of cervical region 03/22/2015   Complex partial seizures (HCC) 09/16/2013   Encounter for Medicare annual wellness exam 06/16/2013   Antiplatelet or antithrombotic long-term use 06/08/2013   Goiter 09/01/2012   Other screening mammogram 06/13/2012   Allergic rhinitis 06/13/2012   Carotid artery disease (HCC) 08/20/2011   Obstructive sleep apnea 09/18/2007   TOBACCO USE 08/08/2007   Hyperlipidemia associated with type 2 diabetes mellitus (HCC) 03/26/2007   ANXIETY 03/26/2007   DEPRESSION 03/26/2007   Migraine headache 03/26/2007   CARPAL TUNNEL SYNDROME, BILATERAL 03/26/2007   Essential hypertension 03/26/2007   Hx of CABG 03/26/2007   GERD 03/26/2007   Claudication in peripheral vascular disease (HCC) 03/26/2007   Past Medical History:  Diagnosis Date   Anemia    Arthritis    Carotid stenosis    a. s/p right carotid stent 10/2017.   Chronic systolic CHF (congestive heart failure) (HCC)    Colon polyps    COPD (chronic obstructive pulmonary disease) (HCC)    Coronary artery disease    post CABG in 3/07 , coronary stents    Depression    after major stroke years ago, no longer treated   Diabetes mellitus    type 2   Dyslipidemia    Fatty liver    Fibromyalgia    GERD (gastroesophageal reflux  disease)    Headache    hx of    History of kidney stones    History of radiation therapy    right lung 11/15/2021-12/26/2021  Dr Lynwood Nasuti   Hyperlipidemia    Hypertension    Lung cancer [facility])    Myocardial infarction (HCC)    Pneumonia    hx of    Seizures (HCC)    last seizure- 03/2013    Shortness of breath dyspnea    with exertion or when fluid builds up    Sleep apnea    used to wear a cpap- not used in 3 years    Status post dilation of esophageal narrowing    Stroke [facility]) 1993   problems with balance    Systolic murmur    known mild AS and MR   Thyroid  goiter    Tobacco abuse    Past Surgical History:  Procedure Laterality Date   ABDOMINAL AORTOGRAM W/LOWER EXTREMITY N/A 04/18/2020   Procedure: ABDOMINAL AORTOGRAM W/LOWER EXTREMITY;  Surgeon: Court Dorn PARAS, MD;  [facility]: [facility] INV[facility];  Service: Cardiovascular;  Laterality: N/A;   APPENDECTOMY     BIOPSY  02/03/2019   Procedure: BIOPSY;  Surgeon: Aneita Gwendlyn DASEN, MD;  Location: [facility] ENDOSCOPY;  Service: Endoscopy;;   BREAST BIOPSY Left 2016   BRONCHIAL BIOPSY  09/18/2021   Procedure: BRONCHIAL BIOPSIES;  Surgeon: Shelah Lamar RAMAN, MD;  Location: [facility] ENDOSCOPY;  Service: Pulmonary;;   BRONCHIAL BRUSHINGS  09/18/2021   Procedure: BRONCHIAL BRUSHINGS;  Surgeon: Shelah Lamar RAMAN, MD;  Location: [facility] ENDOSCOPY;  Service: Pulmonary;;   BRONCHIAL NEEDLE ASPIRATION BIOPSY  09/18/2021   Procedure: BRONCHIAL NEEDLE ASPIRATION BIOPSIES;  Surgeon: Shelah Lamar RAMAN, MD;  Location: [facility] ENDOSCOPY;  Service: Pulmonary;;   CARDIAC CATHETERIZATION  11/29/05   EF of 55%   CARDIAC CATHETERIZATION  08/06/06   EF of 45-50%   CARDIAC CATHETERIZATION N/A 05/06/2015   Procedure: Right/Left Heart Cath and Coronary/Graft Angiography;  Surgeon:  Peter M Jordan, MD;  Location: [facility] INVASIVE CV LAB;  Service: Cardiovascular;  Laterality: N/A;   CAROTID PTA/STENT INTERVENTION N/A 10/10/2017   Procedure: CAROTID PTA/STENT INTERVENTION - Right;  Surgeon:  Serene Gaile ORN, MD;  Location: [facility];  Service: Cardiovascular;  Laterality: N/A;   CERVICAL FUSION  1990   CHOLECYSTECTOMY     COLON RESECTION     mass removed and 4 in of colon   COLONOSCOPY WITH PROPOFOL  N/A 07/16/2017   Procedure: COLONOSCOPY WITH PROPOFOL ;  Surgeon: Aneita Gwendlyn DASEN, MD;  Location: [facility] ENDOS[facility];  Service: Endoscopy;  Laterality: N/A;   CORONARY ARTERY BYPASS GRAFT  12/04/2005   x5 -- left internal mammary artery to the LAD, left radial artery to the ramus intermedius, saphenous vein graft to the obtuse marginal 1, sequential saphenous vein grat to the acute marginal and posterior descending, endoscopic vein harvesting from the left thigh with open vein harvest from right leg   CORONARY STENT PLACEMENT  08/11/06   PCI of her ciurcumflex/OM vessel   ESOPHAGOGASTRODUODENOSCOPY (EGD) WITH PROPOFOL  N/A 02/03/2019   Procedure: ESOPHAGOGASTRODUODENOSCOPY (EGD) WITH PROPOFOL ;  Surgeon: Aneita Gwendlyn DASEN, MD;  Location: [facility] ENDOS[facility];  Service: Endoscopy;  Laterality: N/A;   FINE NEEDLE ASPIRATION  09/18/2021   Procedure: FINE NEEDLE ASPIRATION (FNA) LINEAR;  Surgeon: Shelah Lamar RAMAN, MD;  Location[name] ENDOSCOPY;  Service: Pulmonary;;   ICD IMPLANT N/A 07/15/2020   Procedure: ICD IMPLANT;  Surgeon: Cindie Ole DASEN, MD;  [facility]: [facility];  Service: Cardiovascular;  Laterality: N/A;   IR IMAGING GUIDED PORT INSERTION  10/20/2021   LAPAROTOMY Bilateral 05/19/2015   Procedure: EXPLORATORY LAPAROTOMY WITH BILATERAL SALPINGO OOPHORECTOMY /OMENTECTOMY/SEGMENTAL SIGMOID COLECTOMY ;  Surgeon: Maurilio Ship, MD;  Location: [facility] ORS;  Service: Gynecology;  Laterality: Bilateral;   PERIPHERAL VASCULAR BALLOON ANGIOPLASTY  04/18/2020   Procedure: PERIPHERAL VASCULAR BALLOON ANGIOPLASTY;  Surgeon: Court Dorn PARAS, MD;  [facility];  Service: Cardiovascular;;  rt SFA  atherectomy and DCB   PERIPHERAL VASCULAR CATHETERIZATION N/A 11/15/2015   Procedure: Carotid  PTA/Stent Intervention;  Surgeon: Dorn PARAS Court, MD;  [facility]: [facility];  Service: Cardiovascular;  Laterality: N/A;   PERIPHERAL VASCULAR CATHETERIZATION  11/15/2015   Procedure: Carotid Angiography;  Surgeon: Dorn PARAS Court, MD;  Location: [facility] INVASIVE CV LAB;  Service: Cardiovascular;;   RIGHT HEART CATH N/A 01/27/2020   Procedure: RIGHT HEART CATH;  Surgeon: Rolan Ezra RAMAN, MD;  Location: [facility] INVASIVE CV LAB;  Service: Cardiovascular;  Laterality: N/A;   VIDEO BRONCHOSCOPY WITH ENDOBRONCHIAL ULTRASOUND N/A 09/18/2021   Procedure: VIDEO BRONCHOSCOPY WITH ENDOBRONCHIAL ULTRASOUND;  Surgeon: Shelah Lamar RAMAN, MD;  Location: [facility]OS[facility];  Service: Pulmonary;  Laterality: N/A;   VIDEO BRONCHOSCOPY WITH RADIAL ENDOBRONCHIAL ULTRASOUND  09/18/2021   Procedure: RADIAL ENDOBRONCHIAL ULTRASOUND;  Surgeon: Shelah Lamar RAMAN, MD;  Location: [facility] ENDOSCOPY;  Service: Pulmonary;;   Social History   Tobacco Use   Smoking status: Every Day    Current packs/day: 0.00    Average packs/day: 0.5 packs/day for 54.4 years (27.2 ttl pk-yrs)    Types: Cigarettes    Start date: 3    Last attempt to quit: 02/22/2020    Years since quitting: 3.7   Smokeless tobacco: Never   Tobacco comments:    Pt smokes a 1 pack a day 08/30/2021 / pl  Vaping Use   Vaping status: Former  Substance Use Topics   Alcohol use: No   Drug use: No  Family History  Problem Relation Age of Onset   Heart disease Mother    Diabetes Mother    COPD Mother    Hyperlipidemia Mother    Hypertension Mother    Cancer Father        met, origin unknown   Heart attack Father    Hypertension Sister    Cancer Sister        eyelid   Glaucoma Sister    Heart disease Maternal Grandfather    Drug abuse Paternal Grandmother    Stroke Paternal Grandfather    Heart disease Maternal Aunt        x 2 aunts   Lung cancer Paternal Aunt        lung with mets to brain   Melanoma Paternal Uncle    Lung cancer Paternal Uncle         lung/liver to brain   Cancer Paternal Uncle        cancer of unknown type   Colon cancer Neg Hx    Stomach cancer Neg Hx    Esophageal cancer Neg Hx    Allergies  Allergen Reactions   Bee Venom Itching and Swelling   Benadryl  [Diphenhydramine ] Other (See Comments)    Insomnia    Entresto  [Sacubitril -Valsartan ] Cough   Farxiga  [Dapagliflozin ]     Yeast infection   Tetracycline     Unknown, pt cannot recall exact reaction   Canagliflozin Other (See Comments)   Sitagliptin Other (See Comments)   Ace Inhibitors Cough   Current Outpatient Medications on File Prior to Visit  Medication Sig Dispense Refill   acetaminophen  (TYLENOL ) 500 MG tablet Take 1,000 mg by mouth at bedtime.     albuterol  (VENTOLIN  HFA) 108 (90 Base) MCG/ACT inhaler INHALE 2 PUFFS EVERY 6 HOURS AS NEEDED FOR WHEEZING/SHORTNESS OF BREATH 18 each 0   aspirin  EC 81 MG tablet Take 81 mg by mouth every evening.  30 tablet 6   BD PEN NEEDLE NANO 2ND GEN 32G X 4 MM MISC Inject into the skin 3 (three) times daily.     clopidogrel  (PLAVIX ) 75 MG tablet TAKE 1 TABLET DAILY 90 tablet 3   digoxin  (LANOXIN ) 0.125 MG tablet TAKE ONE-HALF (1/2) TABLET DAILY 45 tablet 3   ezetimibe  (ZETIA ) 10 MG tablet Take 1 tablet (10 mg total) by mouth daily. 90 tablet 3   fluticasone  (FLONASE ) 50 MCG/ACT nasal spray Place 2 sprays into both nostrils daily. 48 g 3   gabapentin  (NEURONTIN ) 300 MG capsule TAKE 2 CAPSULES DAILY AT   BEDTIME 180 capsule 3   glucose blood (ONETOUCH ULTRA) test strip 3 (three) times daily.     guaiFENesin  (MUCINEX ) 600 MG 12 hr tablet Take 2 tablets (1,200 mg total) by mouth 2 (two) times daily. 360 tablet 3   HUMULIN 70/30 KWIKPEN (70-30) 100 UNIT/ML KwikPen SMARTSIG:55 Unit(s) SUB-Q Twice Daily     HYDROcodone -acetaminophen  (NORCO) 5-325 MG tablet Take 1 tablet by mouth every 6 (six) hours as needed for moderate pain. 30 tablet 0   icosapent  Ethyl (VASCEPA ) 1 g capsule Take 1 g by mouth 2 (two) times daily.      levETIRAcetam  (KEPPRA ) 1000 MG tablet Take 1 tablet (1,000 mg total) by mouth 2 (two) times daily. 180 tablet 3   Magnesium  400 MG TABS Take 400 mg by mouth 2 (two) times daily. 180 tablet 3   metFORMIN  (GLUCOPHAGE ) 500 MG tablet Take 500 mg by mouth 2 (two) times daily with a meal.  metolazone  (ZAROXOLYN ) 2.5 MG tablet TAKE 1 TABLET TWICE WEEKLY,EVERY MONDAY AND FRIDAY    (CHANGE IN DOSAGE). 24 tablet 3   mexiletine (MEXITIL) 150 MG capsule Take 1 capsule (150 mg total) by mouth 2 (two) times daily. 180 capsule 3   nitroGLYCERIN  (NITROLINGUAL ) 0.4 MG/SPRAY spray Place 1 spray under the tongue every 5 (five) minutes x 3 doses as needed for chest pain. 12 g 3   nystatin  (MYCOSTATIN /NYSTOP ) powder APPLY 1 APPLICATION        TOPICALLY 4 TIMES A DAY AS NEEDED FOR YEAST INFECTIONS 60 g 5   ONE TOUCH ULTRA TEST test strip USE AS DIRECTED FOR TESTING BLOOD GLUCOSE 3 TIMES DAILY  1   polyethylene glycol (MIRALAX  / GLYCOLAX ) 17 g packet Take 17 g by mouth daily.     potassium chloride  (MICRO-K ) 10 MEQ CR capsule Take 2 capsules (20 mEq total) by mouth 2 (two) times daily. 360 capsule 3   ranolazine  (RANEXA ) 1000 MG SR tablet TAKE 1 TABLET TWICE A DAY 180 tablet 3   rosuvastatin  (CRESTOR ) 20 MG tablet Take 1 tablet (20 mg total) by mouth daily. 90 tablet 3   sodium chloride  (OCEAN) 0.65 % SOLN nasal spray Place 1 spray into both nostrils as needed for congestion.     spironolactone  (ALDACTONE ) 25 MG tablet Take 1 tablet (25 mg total) by mouth at bedtime. 90 tablet 3   torsemide  (DEMADEX ) 20 MG tablet Take 4 tablets (80 mg total) by mouth every morning AND 3 tablets (60 mg total) every evening. 540 tablet 3   triamcinolone  cream (KENALOG ) 0.5 % Apply 1 application topically 3 (three) times daily as needed (rash). To affected areas     Vericiguat  (VERQUVO ) 10 MG TABS Take 1 tablet (10 mg total) by mouth daily. 90 tablet 3   No current facility-administered medications on file prior to visit.    Review of  Systems  Constitutional:  Positive for fatigue. Negative for activity change, appetite change, fever and unexpected weight change.  HENT:  Negative for congestion, ear pain, rhinorrhea, sinus pressure and sore throat.   Eyes:  Negative for pain, redness and visual disturbance.  Respiratory:  Positive for cough and shortness of breath. Negative for wheezing.   Cardiovascular:  Negative for chest pain and palpitations.  Gastrointestinal:  Negative for abdominal pain, blood in stool, constipation and diarrhea.  Endocrine: Negative for polydipsia and polyuria.  Genitourinary:  Negative for dysuria, frequency and urgency.  Musculoskeletal:  Positive for arthralgias. Negative for back pain and myalgias.  Skin:  Negative for pallor and rash.  Allergic/Immunologic: Negative for environmental allergies.  Neurological:  Positive for weakness. Negative for dizziness, syncope and headaches.  Hematological:  Negative for adenopathy. Does not bruise/bleed easily.  Psychiatric/Behavioral:  Negative for decreased concentration and dysphoric mood. The patient is not nervous/anxious.        Objective:   Physical Exam Constitutional:      General: She is not in acute distress.    Appearance: Normal appearance. She is well-developed and normal weight. She is not ill-appearing or diaphoretic.  HENT:     Head: Normocephalic and atraumatic.     Right Ear: Tympanic membrane, ear canal and external ear normal.     Left Ear: Tympanic membrane, ear canal and external ear normal.     Nose: Nose normal. No congestion.     Mouth/Throat:     Mouth: Mucous membranes are moist.     Pharynx: Oropharynx is clear. No posterior oropharyngeal  erythema.  Eyes:     General: No scleral icterus.    Extraocular Movements: Extraocular movements intact.     Conjunctiva/sclera: Conjunctivae normal.     Pupils: Pupils are equal, round, and reactive to light.  Neck:     Thyroid : No thyromegaly.     Vascular: No carotid bruit  or JVD.  Cardiovascular:     Rate and Rhythm: Normal rate and regular rhythm.     Pulses: Normal pulses.     Heart sounds: Murmur heard.     No gallop.  Pulmonary:     Effort: Pulmonary effort is normal. No respiratory distress.     Breath sounds: Normal breath sounds. No wheezing.     Comments: Diffusely distant bs No wheezing No crackles  Chest:     Chest wall: No tenderness.  Abdominal:     General: Bowel sounds are normal. There is no distension or abdominal bruit.     Palpations: Abdomen is soft. There is no mass.     Tenderness: There is no abdominal tenderness.     Hernia: No hernia is present.  Genitourinary:    Comments: Breast exam: No mass, nodules, thickening, tenderness, bulging, retraction, inflamation, nipple discharge or skin changes noted.  No axillary or clavicular LA.     Musculoskeletal:        General: No tenderness. Normal range of motion.     Cervical back: Normal range of motion and neck supple. No rigidity. No muscular tenderness.     Right lower leg: No edema.     Left lower leg: No edema.     Comments: Mild kyphosis   Lymphadenopathy:     Cervical: No cervical adenopathy.  Skin:    General: Skin is warm and dry.     Coloration: Skin is not pale.     Findings: No erythema or rash.     Comments: Solar lentigines diffusely   Neurological:     Mental Status: She is alert. Mental status is at baseline.     Cranial Nerves: No cranial nerve deficit.     Motor: No abnormal muscle tone.     Coordination: Coordination normal.     Gait: Gait normal.     Deep Tendon Reflexes: Reflexes are normal and symmetric. Reflexes normal.  Psychiatric:        Mood and Affect: Mood normal.        Cognition and Memory: Cognition and memory normal.           Assessment & Plan:   Problem List Items Addressed This Visit       Cardiovascular and Mediastinum   Essential hypertension - Primary   bp in fair control at this time  BP Readings from Last 1  Encounters:  11/05/23 126/62   No changes needed Most recent labs reviewed  Disc lifstyle change with low sodium diet and exercise  Under cardiology care for this and chf  Metolazone  2.5 mg twice weekly Torsemide  80 mg am and 60 mg pm  Spironolactone  25 mg at bedtime       Relevant Orders   Lipid Panel   CBC with Differential/Platelet   Comprehensive metabolic panel   Chronic systolic congestive heart failure (HCC)   Clinically stable today  Reviewed cardiology notes   See a/p for HTN for diuretic meds         Respiratory   Smokers' cough (HCC)   Encouraged smoking cessation  This is worse in warm conditions  Malignant neoplasm of unspecified part of unspecified bronchus or lung (HCC)   In remission  Monitored by pulm and onc      Chronic respiratory failure (HCC)     Digestive   GERD   Takes protonix  40 mg daily  Unable to come off of it  Encouraged to avoid triggers food wise       Relevant Medications   pantoprazole  (PROTONIX ) 40 MG tablet     Endocrine   Type 2 diabetes mellitus with diabetic polyneuropathy, with long-term current use of insulin  (HCC)   Under care of endocrinology  Per pt last A1c 6.5  Normal foot exam today       Hyperlipidemia associated with type 2 diabetes mellitus (HCC)   Disc goals for lipids and reasons to control them Rev last labs with pt Rev low sat fat diet in detail Taking Crestor  20 mg daily  Vascepa  1 g bid  Zetia  10 mg daily   In setting of cardiac dz  Sees cardiology  Lipid lab today      Relevant Orders   Lipid Panel   Comprehensive metabolic panel   Goiter   No change on exam Continues endocrinology care         Musculoskeletal and Integument   Osteopenia   Dexa ordered Discussed fall prevention, supplements and exercise for bone density    Unable to afford vit D unfortunately  Unable to exercise  Multiple fractures in past  Continues to smoke         Other   TOBACCO USE   Disc in  detail risks of smoking and possible outcomes including copd, vascular/ heart disease, cancer , respiratory and sinus infections as well as osteoporosis  Pt voices understanding Pt has lung cancer in remission and copd   Encouraged to continue pulmonary care       RESOLVED: Seizures (HCC)   Screening mammogram, encounter for   Mammogram ordered  Pt will call to schedule       Relevant Orders   MM 3D SCREENING MAMMOGRAM BILATERAL BREAST   Presence of heart assist device (HCC)   No clinical changes  Under care of cardiology      Iron  deficiency anemia   Lab today  Gets iron  inf prn from hematology        Relevant Orders   Iron    History of HPV infection   Due for gyn follow up - ? Pap  Had colp in 2021  Ref to gyn       Relevant Orders   Ambulatory referral to Gynecology   Generalized weakness   Ongoing  Unable to exercise in setting of many chronic problems  Needs help at home       Financial difficulty   Pt is unable to afford over the counter meds including vit D and B12 Also needs help with self care at home if possible in setting of complex med history and mobility impairment   Will ref for SW help if able      Estrogen deficiency   Dexa ordered       Relevant Orders   DG Bone Density   Current use of proton pump inhibitor   B12 added to labs  Continues protonix   Watching bone density        Complex partial seizures (HCC)   No seizures in past year  Sees neurology  Continues keppra  1000 mg bid       Colon cancer screening  Colonoscopy 07/2017 with 10 y recall      B12 deficiency   Lab today  Pt cannot afford over the counter B12 supplement currently       Relevant Orders   Vitamin B12   Other Visit Diagnoses       Need for influenza vaccination       Relevant Orders   Flu Vaccine Trivalent High Dose (Fluad) (Completed)     Need for pneumococcal 20-valent conjugate vaccination       Relevant Orders   Pneumococcal conjugate  vaccine 20-valent (Prevnar 20) (Completed)

## 2023-11-05 NOTE — Assessment & Plan Note (Signed)
Clinically stable today  Reviewed cardiology notes   See a/p for HTN for diuretic meds

## 2023-11-05 NOTE — Assessment & Plan Note (Signed)
Takes protonix 40 mg daily  Unable to come off of it  Encouraged to avoid triggers food wise

## 2023-11-05 NOTE — Assessment & Plan Note (Signed)
No seizures in past year  Sees neurology  Continues keppra 1000 mg bid

## 2023-11-05 NOTE — Assessment & Plan Note (Signed)
Due for gyn follow up - ? Pap  Had colp in 2021  Ref to gyn

## 2023-11-05 NOTE — Assessment & Plan Note (Signed)
 bp in fair control at this time  BP Readings from Last 1 Encounters:  11/05/23 126/62   No changes needed Most recent labs reviewed  Disc lifstyle change with low sodium diet and exercise  Under cardiology care for this and chf  Metolazone  2.5 mg twice weekly Torsemide  80 mg am and 60 mg pm  Spironolactone  25 mg at bedtime

## 2023-11-05 NOTE — Patient Instructions (Addendum)
 If you are interested in the new shingles vaccine (Shingrix) - call your local pharmacy to check on coverage and availability     Today - flu shot or pneumonia shot   I will reach out to our pharmacist/social work regarding cost of vitamin D and over the counter medicines     I put the referral in for gyn to get caught up with their care and pap  Please let us  know if you don't hear in 1-2 weeks   You have an order for:  []   2D Mammogram  [x]   3D Mammogram  [x]   Bone Density     Please call for appointment:   []   [facility]  [address] [place] [address]  [phone]  []   [facility])   [address]  [place], [place] [address]  [phone]  [x]   [facility]      [address] [place], [place]        [phone]         []   [facility]  [address] [place], [place]  [phone]  []  [facility]   [address]   [place], [place] [address]  [phone]  []  [facility]  [address] [place], [place] [address] [phone]    Make sure to wear two piece clothing  No lotions powders or deodorants the day of the appointment Make sure to bring picture ID and insurance card.  Bring list of medications you are currently taking including any supplements.   Schedule your screening mammogram through MyChart!   Select [place] imaging sites can now be scheduled through MyChart.  Log into your MyChart account.  Go to 'Visit' (or 'Appointments' if  on mobile App) --> Schedule an  Appointment  Under 'Select a Reason for Visit' choose the Mammogram  Screening option.  Complete the pre-visit questions  and select the time and place that  best fits your schedule

## 2023-11-05 NOTE — Assessment & Plan Note (Signed)
 Disc in detail risks of smoking and possible outcomes including copd, vascular/ heart disease, cancer , respiratory and sinus infections as well as osteoporosis  Pt voices understanding Pt has lung cancer in remission and copd   Encouraged to continue pulmonary care

## 2023-11-05 NOTE — Assessment & Plan Note (Signed)
Under care of endocrinology  Per pt last A1c 6.5  Normal foot exam today

## 2023-11-05 NOTE — Assessment & Plan Note (Signed)
Encouraged smoking cessation  This is worse in warm conditions

## 2023-11-05 NOTE — Assessment & Plan Note (Signed)
Dexa ordered Discussed fall prevention, supplements and exercise for bone density    Unable to afford vit D unfortunately  Unable to exercise  Multiple fractures in past  Continues to smoke

## 2023-11-05 NOTE — Assessment & Plan Note (Signed)
Ongoing  Unable to exercise in setting of many chronic problems  Needs help at home

## 2023-11-05 NOTE — Assessment & Plan Note (Signed)
In remission  Monitored by pulm and onc

## 2023-11-05 NOTE — Assessment & Plan Note (Signed)
Colonoscopy 07/2017 with 10 y recall

## 2023-11-05 NOTE — Assessment & Plan Note (Signed)
Lab today  Pt cannot afford over the counter B12 supplement currently

## 2023-11-05 NOTE — Assessment & Plan Note (Signed)
 Dexa ordered

## 2023-11-05 NOTE — Assessment & Plan Note (Signed)
No clinical changes Under care of cardiology 

## 2023-11-05 NOTE — Assessment & Plan Note (Signed)
B12 added to labs  Continues protonix  Watching bone density

## 2023-11-05 NOTE — Assessment & Plan Note (Signed)
Pt is unable to afford over the counter meds including vit D and B12 Also needs help with self care at home if possible in setting of complex med history and mobility impairment   Will ref for SW help if able

## 2023-11-05 NOTE — Assessment & Plan Note (Signed)
Mammogram ordered °Pt will call to schedule  °

## 2023-11-05 NOTE — Assessment & Plan Note (Signed)
Lab today  Gets iron inf prn from hematology

## 2023-11-05 NOTE — Assessment & Plan Note (Signed)
No change on exam Continues endocrinology care

## 2023-11-05 NOTE — Assessment & Plan Note (Signed)
Disc goals for lipids and reasons to control them Rev last labs with pt Rev low sat fat diet in detail Taking Crestor 20 mg daily  Vascepa 1 g bid  Zetia 10 mg daily   In setting of cardiac dz  Sees cardiology  Lipid lab today

## 2023-11-06 LAB — CBC WITH DIFFERENTIAL/PLATELET
Basophils Absolute: 0.1 10*3/uL (ref 0.0–0.1)
Basophils Relative: 1.2 % (ref 0.0–3.0)
Eosinophils Absolute: 0.1 10*3/uL (ref 0.0–0.7)
Eosinophils Relative: 0.8 % (ref 0.0–5.0)
HCT: 45 % (ref 36.0–46.0)
Hemoglobin: 15.3 g/dL — ABNORMAL HIGH (ref 12.0–15.0)
Lymphocytes Relative: 18.6 % (ref 12.0–46.0)
Lymphs Abs: 1.4 10*3/uL (ref 0.7–4.0)
MCHC: 34.1 g/dL (ref 30.0–36.0)
MCV: 89.6 fL (ref 78.0–100.0)
Monocytes Absolute: 0.7 10*3/uL (ref 0.1–1.0)
Monocytes Relative: 10.2 % (ref 3.0–12.0)
Neutro Abs: 5.1 10*3/uL (ref 1.4–7.7)
Neutrophils Relative %: 69.2 % (ref 43.0–77.0)
Platelets: 223 10*3/uL (ref 150.0–400.0)
RBC: 5.02 Mil/uL (ref 3.87–5.11)
RDW: 15.2 % (ref 11.5–15.5)
WBC: 7.4 10*3/uL (ref 4.0–10.5)

## 2023-11-06 LAB — LIPID PANEL
Cholesterol: 119 mg/dL (ref 0–200)
HDL: 49.8 mg/dL (ref >39.00)
LDL Cholesterol: 42 mg/dL (ref 0–99)
NonHDL: 68.85
Total CHOL/HDL Ratio: 2
Triglycerides: 135 mg/dL (ref 0.0–149.0)
VLDL: 27 mg/dL (ref 0.0–40.0)

## 2023-11-06 LAB — COMPREHENSIVE METABOLIC PANEL
ALT: 10 U/L (ref 0–35)
AST: 13 U/L (ref 0–37)
Albumin: 4.4 g/dL (ref 3.5–5.2)
Alkaline Phosphatase: 52 U/L (ref 39–117)
BUN: 28 mg/dL — ABNORMAL HIGH (ref 6–23)
CO2: 30 meq/L (ref 19–32)
Calcium: 9.7 mg/dL (ref 8.4–10.5)
Chloride: 95 meq/L — ABNORMAL LOW (ref 96–112)
Creatinine, Ser: 0.97 mg/dL (ref 0.40–1.20)
GFR: 59.56 mL/min — ABNORMAL LOW (ref >60.00)
Glucose, Bld: 173 mg/dL — ABNORMAL HIGH (ref 70–99)
Potassium: 3.3 meq/L — ABNORMAL LOW (ref 3.5–5.1)
Sodium: 137 meq/L (ref 135–145)
Total Bilirubin: 0.5 mg/dL (ref 0.2–1.2)
Total Protein: 7 g/dL (ref 6.0–8.3)

## 2023-11-06 LAB — VITAMIN B12: Vitamin B-12: 251 pg/mL (ref 211–911)

## 2023-11-06 LAB — IRON: Iron: 100 ug/dL (ref 42–145)

## 2023-11-07 ENCOUNTER — Telehealth: Payer: Self-pay | Admitting: *Deleted

## 2023-11-07 NOTE — Addendum Note (Signed)
 Addended by: Deri Fleet A on: 11/07/2023 04:41 PM   Modules accepted: Orders

## 2023-11-07 NOTE — Progress Notes (Signed)
 Complex Care Management Note Care Guide Note  11/07/2023 Name: Diane [name] MRN: [id number] DOB: [birth year]   Complex Care Management Outreach Attempts: An unsuccessful telephone outreach was attempted today to offer the patient information about available complex care management services.  Follow Up Plan:  Additional outreach attempts will be made to offer the patient complex care management information and services.   Encounter Outcome:  No Answer  Thedford Franks, CMA, Care Guide [facility]  [facility], [facility] Guide Direct Dial: [phone]  Fax: [phone] Website: [email or web address]

## 2023-11-08 NOTE — Progress Notes (Signed)
 Complex Care Management Note Care Guide Note  11/08/2023 Name: Diane [name] MRN: [id number] DOB: 1955/12/25   Complex Care Management Outreach Attempts: A second unsuccessful outreach was attempted today to offer the patient with information about available complex care management services.  Follow Up Plan:  Additional outreach attempts will be made to offer the patient complex care management information and services.   Encounter Outcome:  No Answer  Thedford Franks, CMA, Care Guide [facility]  [facility], [facility] Guide Direct Dial: [phone]  Fax: [phone] Website: [email or web address]

## 2023-11-11 NOTE — Progress Notes (Signed)
 Complex Care Management Note Care Guide Note  11/11/2023 Name: CYRSTAL ALARIE MRN: [id number] DOB: 1956/05/27   Complex Care Management Outreach Attempts: A third unsuccessful outreach was attempted today to offer the patient with information about available complex care management services.  Follow Up Plan:  No further outreach attempts will be made at this time. We have been unable to contact the patient to offer or enroll patient in complex care management services.  Encounter Outcome:  No Answer  Kandis Ormond, CMA, Care Guide [facility]  [facility], [facility] Guide Direct Dial: [phone]  Fax: [phone] Website: [email or web address]

## 2023-12-03 NOTE — Progress Notes (Signed)
 Remote ICD transmission.

## 2023-12-09 ENCOUNTER — Telehealth: Payer: Self-pay

## 2023-12-09 ENCOUNTER — Inpatient Hospital Stay: Payer: Medicare Other | Attending: Internal Medicine

## 2023-12-09 ENCOUNTER — Inpatient Hospital Stay

## 2023-12-09 DIAGNOSIS — E876 Hypokalemia: Secondary | ICD-10-CM | POA: Insufficient documentation

## 2023-12-09 DIAGNOSIS — Z85118 Personal history of other malignant neoplasm of bronchus and lung: Secondary | ICD-10-CM | POA: Diagnosis not present

## 2023-12-09 DIAGNOSIS — Z452 Encounter for adjustment and management of vascular access device: Secondary | ICD-10-CM | POA: Diagnosis not present

## 2023-12-09 DIAGNOSIS — D509 Iron deficiency anemia, unspecified: Secondary | ICD-10-CM

## 2023-12-09 DIAGNOSIS — Z95828 Presence of other vascular implants and grafts: Secondary | ICD-10-CM

## 2023-12-09 MED ORDER — HEPARIN SOD (PORK) LOCK FLUSH 100 UNIT/ML IV SOLN
500.0000 [IU] | Freq: Once | INTRAVENOUS | Status: AC
  Administered 2023-12-09: 500 [IU]

## 2023-12-09 MED ORDER — SODIUM CHLORIDE 0.9% FLUSH
10.0000 mL | Freq: Once | INTRAVENOUS | Status: AC
  Administered 2023-12-09: 10 mL

## 2023-12-09 NOTE — Telephone Encounter (Signed)
 Copied from CRM [id number]. Topic: Clinical - Request for Lab/Test Order >> Dec 09, 2023 11:41 AM Theodis Sato wrote: Reason for CRM:  *Message for Dr. Milinda Antis and her nurse* Patient states Dr. Milinda Antis wants her to have her potassium checked but the patient cannot get a ride to the clinic but does have a [facility] and states that Dr. Milinda Antis can call over there and have the lab work ordered to that facility. Patient is requesting that this be done and that the nurse call her to confirm if this is complete and okay. Patients appointment is at 2 PM

## 2023-12-09 NOTE — Telephone Encounter (Signed)
 Please let her know she will have to mention it to them Unsure if they can do it or not  Either way, the order is in

## 2023-12-09 NOTE — Telephone Encounter (Signed)
 I put the order in the system Unsure if I need to do order differently to be seen at another office   Will cc Jazilyn Siegenthaler team and lab folks  Thanks

## 2023-12-09 NOTE — Addendum Note (Signed)
 Addended by: [name] A on: 12/09/2023 01:07 PM   Modules accepted: Orders

## 2023-12-10 ENCOUNTER — Encounter

## 2023-12-10 DIAGNOSIS — H18413 Arcus senilis, bilateral: Secondary | ICD-10-CM | POA: Diagnosis not present

## 2023-12-10 DIAGNOSIS — H25013 Cortical age-related cataract, bilateral: Secondary | ICD-10-CM | POA: Diagnosis not present

## 2023-12-10 DIAGNOSIS — H25043 Posterior subcapsular polar age-related cataract, bilateral: Secondary | ICD-10-CM | POA: Diagnosis not present

## 2023-12-10 DIAGNOSIS — H2512 Age-related nuclear cataract, left eye: Secondary | ICD-10-CM | POA: Diagnosis not present

## 2023-12-10 DIAGNOSIS — H2513 Age-related nuclear cataract, bilateral: Secondary | ICD-10-CM | POA: Diagnosis not present

## 2023-12-10 NOTE — Telephone Encounter (Signed)
 Patient will cb to schedule. She stated that she will have to find a ride and then she will schedule.

## 2023-12-10 NOTE — Telephone Encounter (Signed)
 Looks like oncology didn't draw our lab, please schedule lab appt here to get bmet rechecked (non fasting)

## 2023-12-17 ENCOUNTER — Telehealth: Payer: Self-pay

## 2023-12-17 ENCOUNTER — Telehealth (HOSPITAL_COMMUNITY): Payer: Self-pay

## 2023-12-17 NOTE — Telephone Encounter (Signed)
 Copied from CRM [id number]. Topic: Clinical - Request for Lab/Test Order >> Dec 17, 2023 10:38 AM Adele Barthel wrote: Reason for CRM:   Patient has a BMP ordered by provider that she would like to see if she can have added to the bloodwork her cardiologist, Dr. Shirlee Latch will perform at her appt on 12/18/2023.   [facility] Phn# [phone]

## 2023-12-17 NOTE — Progress Notes (Signed)
 PCP: Judy Pimple, MD Cardiology: Dr. [place] HF Cardiology: Dr. Shirlee Latch  68 y.o. with history of CAD, ischemic cardiomyopathy, CVA, and valvular disease was referred by Dr. [place] for CHF evaluation.  Patient has an extensive history of vascular disease.  She had CABG in 2007.  Repeat LHC in 2007 showed all grafts closed except LIMA-LAD.  Patient had DES to LCx/OM.  LHC 2016 showed occlusion of DES to LCx/OM with L-L and L-R collaterals. Poor targets for redo CABG.   In 2/21, echo was done showing EF 20-25% with moderate RV dysfunction, moderate-severe MR, moderate TR.  Despite smoking history, PFTs in 2019 looked relatively normal. She additionally had the placement of a carotid stent on the right in 1/19.    RHC was done in 4/21, showing near-normal filling pressures with CI 2.12 by Fick.  CPX in 5/21 showed peak VO2 13, VE/VCO2 slope 37 => moderate HF limitation.  The PFTs on the CPX were not markedly abnormal. She saw Dr. Allyson Sabal recently, peripheral arterial dopplers in 4/21 showed significant stenosis right SFA.   We worked her up for LVAD.  She was presented at [facility] and saw Dr. Donata Clay.  He was concerned by her small stature and small LV cavity. Concern that Heartmate 3 would not function well in her small LV, and Heartware LVAD that was thought to be more suitable for small patients has been discontinued.   We considered her for a barostimulation trial, but she does not qualify for either ANTHEM or Batwire due to carotid disease.   She is off dapagliflozin due to recurrrent GU yeast infections.  In 7/21, she had atherectomy of right SFA.  [employer] ICD placed in 10/21.   As she was turned down for LVAD at [facility], Dr. Shirlee Latch had her seen at [facility]. She underwent workup there with echo in 11/21 showing higher EF 35%. RHC, however, showed low CI 1.8.    Echo in 5/22 showed improvement in EF, now up to 45-50% with basal-mid inferolateral and anterolateral akinesis, normal RV.    Underwent bronchoscopy 12/19 for pulmonary nodules, suspicious for malignancy.  Follow up 12/22, NYHA II-III symptoms, mildly volume overloaded and torsemide increased to 60 bid.  Unfortunately diagnosed with small cell lung cancer 12/22 and has had treatment with chemo and radiation.   Echo in 11/23 with EF 40%, basal -mid inferior AK and inferolateral/anterolateral HK, RV normal, mild-moderate MR.   Patient had ICD shock in 4/23 due to VT, no prior chest pain or dyspnea.  Decision made to start amiodarone. She developed imbalance and had multiple falls after starting amiodarone, now off.   Echo was done today and reviewed, EF 40-45% with akinetic basal to mid inferior wall, normal RV, mild MR, IVC normal.   Today she returns for HF follow up. No further ICD discharges though she never started mexiletine, not sure why. Still smoking < 1 ppd.  She reports poor energy level/generalized weakness.  Though not very active, she denies significant exertional dyspnea or chest pain.   Weight has trended down.  Not lightheaded.  She is not getting significant claudication though as noted, she is not very active.   ECG (personally reviewed): NSR, LAFB, inferior and anterior Qs  [employer] device interrogation: Heartlogic score 5               Labs (4/21): K 3.7 => 3.4, creatinine 0.78 => 0.7, hgb 11.3 => 14.4 Labs (5/21): digoxin 0.6, K 3.5, creatinine 0.63 Labs (  6/21): K 4.1, creatinine 0.7, LDL 61, TGs 247 Labs (7/21): K 5, creatinine 0.76, hgb 16.9.  Labs (10/21): K 4.6, creatinine 0.86 Labs (11/21): digoxin 0.9, LDL 45, HDL 27, K 4, creatinine 0.86 Labs (4/22): hgb 14.4, K 4.9, creatinine 0.81, digoxin 0.7 Labs (9/22): hgb 14.5, K 4.9, creatinine 0.8, LDL 38 Labs (4/23): K 4.3, creatinine 0.56  Labs (7/23): hgb 11.1, K 4, creatinine 0.87 Labs (9/23): LDL 34, HDL 33, TGs 186 Labs (10/23): K 3.7, creatinine 0.70 Labs (1/24): LDL 30, TGs 186 Labs (5/24): K 3.8, creatinine 1.12, TSH  normal, Mg 1.7, hgb 14.9, LFTs normal Labs (7/24): K 4, creatinine 0.95 Labs (8/24): LDL 53, TGs 147 Labs (10/24): K 2.9, creatinine 1.45, transferrin saturation 10% with ferritin 10  PMH: 1. CAD: CABG 2007.  - LHC in 2007 showed all grafts closed except LIMA-LAD.  Patient had DES to LCx/OM.   - LHC 2016 showed occlusion of DES to LCx/OM with L-L and L-R collaterals.  - Not candidate for redo CABG with poor targets.  2. Chronic systolic CHF: Ischemic cardiomyopathy.   - Echo (2/21): EF 20-25%, moderately dilated and moderately dysfunctional RV, PASP 49, severe biatrial enlargement, moderate-severe MR, severe TR, mild-moderate AS, IVC dilated.  - RHC (4/21): mean RA 6, PA 41/16, mean 29, mean PCWP 15, CI 2.12, PVR 4.3 WU - CPX (5/21): peak VO2 13, VE/VCO2 slope 37, RER 1.06 => moderate HF limitation.  - Echo (11/21): EF 35%, mild LVH.  - RHC (11/21): baseline CI 1.8, exercise peak VO2 12.2, PCWP up to 24 with exercise.  - Echo (5/22): EF 45-50%, basal-mid inferolateral and anterolateral akinesis, normal RV.  - Echo (11/23): EF 40%, basal -mid inferior AK and inferolateral/anterolateral HK, RV normal, mild-moderate MR.  - Echo (11/24): EF 40-45% with akinetic basal to mid inferior wall, normal RV, mild MR, IVC normal.  3. Fe deficiency anemia: FOBT negative.  4. Carotid stenosis: Right carotid stent in 1/19.  - Carotid dopplers (7/20): 60-79% LICA stenosis.  - Carotid dopplers (6/21): 60-79% LICA stenosis.  - Carotid dopplers (6/22): 60-79% LICA stenosis.  - Carotid dopplers (10/23): 60-79% LICA stenosis, patent RICA stent.  - Carotid dopplers (10/24): 60-79% LICA stenosis 5. H/o CVA 6. HTN 7. Type 2 diabetes 8. Hyperlipidemia 9. Seizure disorder 10. OSA: Cannot tolerate CPAP.  11. Active smoker: PFTs in 2019 were relatively normal.  She does use oxygen at night.  - PFTs (5/21): FEV1 82%, FVC 80%, ratio 102% => minimal obstruction.  12. PAD: ABIs (4/21) 0.76 on right, 0.86 on left =>  significant right SFA stenosis.  - Atherectomy/angioplasty right SFA.  - Peripheral arterial dopplers (2/22): 50-74% stenosis right SFA.  - Peripheral arterial dopplers (6/24): 30-49% right CFA and SFA, occluded left SFA.  13. Chronic penetrating atherosclerotic ulcer descending thoracic aorta.  14. Small cell lung cancer: Diagnosed 12/22. Treated with carboplatin and etoposide, and radiation therapy. 15. Esophageal stricture with dilation 16. VT: 4/23 with ICD discharge.  - Did not tolerate amiodarone due to imbalance  Social History   Socioeconomic History   Marital status: Divorced    Spouse name: Not on file   Number of children: 0   Years of education: Not on file   Highest education level: Not on file  Occupational History   Occupation: disabled  Tobacco Use   Smoking status: Every Day    Current packs/day: 0.00    Average packs/day: 0.5 packs/day for 54.4 years (27.2 ttl pk-yrs)    Types:  Cigarettes    Start date: 1    Last attempt to quit: 02/22/2020    Years since quitting: 3.8   Smokeless tobacco: Never   Tobacco comments:    Pt smokes a 1 pack a day 08/30/2021 / pl  Vaping Use   Vaping status: Former  Substance and Sexual Activity   Alcohol use: No   Drug use: No   Sexual activity: Not Currently    Partners: Male    Birth control/protection: None  Other Topics Concern   Not on file  Social History Narrative   Right handed   Lives alone in a one story home   Social Drivers of Health   Financial Resource Strain: Low Risk  (12/31/2022)   Overall Financial Resource Strain (CARDIA)    Difficulty of Paying Living Expenses: Not very hard  Food Insecurity: No Food Insecurity (12/31/2022)   Hunger Vital Sign    Worried About Running Out of Food in the Last Year: Never true    Ran Out of Food in the Last Year: Never true  Transportation Needs: No Transportation Needs (12/31/2022)   PRAPARE - Administrator, Civil Service (Medical): No    Lack of  Transportation (Non-Medical): No  Physical Activity: Inactive (12/31/2022)   Exercise Vital Sign    Days of Exercise per Week: 0 days    Minutes of Exercise per Session: 0 min  Stress: No Stress Concern Present (12/31/2022)   [employer] of Occupational Health - Occupational Stress Questionnaire    Feeling of Stress : Not at all  Social Connections: Socially Isolated (12/31/2022)   Social Connection and Isolation Panel [NHANES]    Frequency of Communication with Friends and Family: More than three times a week    Frequency of Social Gatherings with Friends and Family: Twice a week    Attends Religious Services: Never    Database administrator or Organizations: No    Attends Banker Meetings: Never    Marital Status: Divorced  Catering manager Violence: Not At Risk (12/31/2022)   Humiliation, Afraid, Rape, and Kick questionnaire    Fear of Current or Ex-Partner: No    Emotionally Abused: No    Physically Abused: No    Sexually Abused: No   Family History  Problem Relation Age of Onset   Heart disease Mother    Diabetes Mother    COPD Mother    Hyperlipidemia Mother    Hypertension Mother    Cancer Father        met, origin unknown   Heart attack Father    Hypertension Sister    Cancer Sister        eyelid   Glaucoma Sister    Heart disease Maternal Grandfather    Drug abuse Paternal Grandmother    Stroke Paternal Grandfather    Heart disease Maternal Aunt        x 2 aunts   Lung cancer Paternal Aunt        lung with mets to brain   Melanoma Paternal Uncle    Lung cancer Paternal Uncle        lung/liver to brain   Cancer Paternal Uncle        cancer of unknown type   Colon cancer Neg Hx    Stomach cancer Neg Hx    Esophageal cancer Neg Hx    ROS: All systems reviewed and negative except as per HPI.   Current Outpatient Medications  Medication Sig  Dispense Refill   acetaminophen (TYLENOL) 500 MG tablet Take 1,000 mg by mouth at bedtime.     albuterol  (VENTOLIN HFA) 108 (90 Base) MCG/ACT inhaler INHALE 2 PUFFS EVERY 6 HOURS AS NEEDED FOR WHEEZING/SHORTNESS OF BREATH 18 each 0   aspirin EC 81 MG tablet Take 81 mg by mouth every evening.  30 tablet 6   BD PEN NEEDLE NANO 2ND GEN 32G X 4 MM MISC Inject into the skin 3 (three) times daily.     clopidogrel (PLAVIX) 75 MG tablet TAKE 1 TABLET DAILY 90 tablet 3   digoxin (LANOXIN) 0.125 MG tablet TAKE ONE-HALF (1/2) TABLET DAILY 45 tablet 3   ezetimibe (ZETIA) 10 MG tablet Take 1 tablet (10 mg total) by mouth daily. 90 tablet 3   fluticasone (FLONASE) 50 MCG/ACT nasal spray Place 2 sprays into both nostrils daily. 48 g 3   gabapentin (NEURONTIN) 300 MG capsule TAKE 2 CAPSULES DAILY AT   BEDTIME 180 capsule 3   glucose blood (ONETOUCH ULTRA) test strip 3 (three) times daily.     guaiFENesin (MUCINEX) 600 MG 12 hr tablet Take 2 tablets (1,200 mg total) by mouth 2 (two) times daily. 360 tablet 3   HUMULIN 70/30 KWIKPEN (70-30) 100 UNIT/ML KwikPen SMARTSIG:55 Unit(s) SUB-Q Twice Daily     HYDROcodone-acetaminophen (NORCO) 5-325 MG tablet Take 1 tablet by mouth every 6 (six) hours as needed for moderate pain. 30 tablet 0   icosapent Ethyl (VASCEPA) 1 g capsule Take 1 g by mouth 2 (two) times daily.     levETIRAcetam (KEPPRA) 1000 MG tablet Take 1 tablet (1,000 mg total) by mouth 2 (two) times daily. 180 tablet 3   Magnesium 400 MG TABS Take 400 mg by mouth 2 (two) times daily. 180 tablet 3   metFORMIN (GLUCOPHAGE) 500 MG tablet Take 500 mg by mouth 2 (two) times daily with a meal.     metolazone (ZAROXOLYN) 2.5 MG tablet TAKE 1 TABLET TWICE WEEKLY,EVERY MONDAY AND FRIDAY    (CHANGE IN DOSAGE). 24 tablet 3   mexiletine (MEXITIL) 150 MG capsule Take 1 capsule (150 mg total) by mouth 2 (two) times daily. 180 capsule 3   nitroGLYCERIN (NITROLINGUAL) 0.4 MG/SPRAY spray Place 1 spray under the tongue every 5 (five) minutes x 3 doses as needed for chest pain. 12 g 3   nystatin (MYCOSTATIN/NYSTOP) powder APPLY 1  APPLICATION        TOPICALLY 4 TIMES A DAY AS NEEDED FOR YEAST INFECTIONS 60 g 5   ONE TOUCH ULTRA TEST test strip USE AS DIRECTED FOR TESTING BLOOD GLUCOSE 3 TIMES DAILY  1   pantoprazole (PROTONIX) 40 MG tablet Take 1 tablet (40 mg total) by mouth daily. 90 tablet 3   polyethylene glycol (MIRALAX / GLYCOLAX) 17 g packet Take 17 g by mouth daily.     ranolazine (RANEXA) 1000 MG SR tablet TAKE 1 TABLET TWICE A DAY 180 tablet 3   rosuvastatin (CRESTOR) 20 MG tablet Take 1 tablet (20 mg total) by mouth daily. 90 tablet 3   sodium chloride (OCEAN) 0.65 % SOLN nasal spray Place 1 spray into both nostrils as needed for congestion.     spironolactone (ALDACTONE) 25 MG tablet Take 1 tablet (25 mg total) by mouth at bedtime. 90 tablet 3   torsemide (DEMADEX) 20 MG tablet Take 4 tablets (80 mg total) by mouth every morning AND 3 tablets (60 mg total) every evening. 540 tablet 3   triamcinolone cream (KENALOG) 0.5 %  Apply 1 application topically 3 (three) times daily as needed (rash). To affected areas     Vericiguat (VERQUVO) 10 MG TABS Take 1 tablet (10 mg total) by mouth daily. 90 tablet 3   No current facility-administered medications for this visit.   Wt Readings from Last 3 Encounters:  11/05/23 48.6 kg (107 lb 2 oz)  10/15/23 47.6 kg (105 lb)  08/20/23 49.8 kg (109 lb 12.8 oz)   LMP 09/02/2003  General: NAD Neck: No JVD, no thyromegaly or thyroid nodule.  Lungs: Clear to auscultation bilaterally with normal respiratory effort. CV: Nondisplaced PMI.  Heart regular S1/S2, no S3/S4, no murmur.  No peripheral edema.  No carotid bruit.  Unable to palpate pedal pulses.  Abdomen: Soft, nontender, no hepatosplenomegaly, no distention.  Skin: Intact without lesions or rashes.  Neurologic: Alert and oriented x 3.  Psych: Normal affect. Extremities: No clubbing or cyanosis.  HEENT: Normal.   Assessment/Plan:  1. Chronic systolic CHF: Ischemic cardiomyopathy, [employer] ICD.  Echo in 2/21  showed EF 20-25%, moderately dilated and moderately dysfunctional RV, PASP 49, severe biatrial enlargement, moderate-severe MR, severe TR, mild-moderate AS, IVC dilated. CPX (5/21) with moderate HF limitation, RHC (4/21) with CI low at 2.12.  Medication titration has been severely limited by hypotension and orthostatic symptoms.  Not a transplant candidate due to smoking. She was seen by cardiac surgery at [facility] and not thought to be good LVAD candidate due to small stature, small LV cavity, and redo sternotomy.  I sent her for a 2nd opinion at [facility].  She had echo there in 11/21 with EF higher at 35% but CI low on RHC at 1.8, no LVAD planned based on higher EF.  Echo in 5/22 showed EF 45-50%.  Echo in 11/23 with EF 40%, normal RV.  Echo today with EF 40-45% with akinetic basal to mid inferior wall, normal RV, mild MR, IVC normal.  NYHA III symptoms chronically due to prominent fatigue.  She is not volume overloaded by exam or [employer].   - Continue torsemide 80 qam/60 qpm.  BMET/BNP today.  - Continue vericiguat 10 mg daily.  - Continue metolazone 2.5 mg every Monday and Friday. - Continue spironolactone 25 mg daily.  - Continue digoxin 0.0625 mg daily. Check digoxin level today.  - She has not tolerated BP- active meds well, avoid Coreg and Entresto for now.  She became hypotensive with losartan.  - She did not tolerate SGLT2 inhibitor due to recurrent GU yeast infections.  - She has had hypotension with beta blockers but may try low dose bisoprolol in the future (will not add today as we still need to start mexiletine).   2. CAD: s/p CABG 2007 complicated by early graft closure.  Repeat cath in 2007 showed only LIMA-LAD still open and she had DES to LCX/OM.  Cath in 2016 showed this stent was occluded.  She has not had targets for redo CABG and she does not have good interventional targets. No recent chest pain. - Continue ASA 81, Plavix 75 mg daily.  - Continue ranolazone 1000 bid.   - Continue  Crestor 20 mg daily + Zetia 10 mg daily, and Vascepa. Good lipids 8/24. 3. Smoking: Still smoking about 1/2 ppd.  She is trying to quit.  - Does not want Chantix.  4. PAD: s/p atherectomy/angioplasty right SFA in 7/21. Has leg pain and weakness chronically. Peripheral arterial dopplers in 6/24 with occluded left SFA.  No rest pain or pedal ulcers.  -  Follows with Dr. Allyson Sabal for [facility], has appt in January.  5. Carotid stenosis: Stable moderate LICA stenosis with patent right carotid stent.  - Needs carotid dopplers 10/25.      6. Fe deficiency anemia: Has had IV Fe in the past. This may contribute to fatigue.  Recent transferrin saturation 10%.  - I will arrange for IV Fe.  7. Small cell lung cancer: Finished chemo and radiation.  - She is followed by Dr. Arbutus Ped. 8. VT: ICD discharge in 4/24.  No chest pain or CHF exacerbation.  Suspect scar-mediated.  She was started on amiodarone but had to stop due to severe imbalance and falls, now resolved.  - I will start her on mexiletine 150 mg bid (was supposed to start last appt but med never went to the pharmacy).   Followup 4 months with APP.   Anderson Malta [facility]  12/17/2023

## 2023-12-17 NOTE — Telephone Encounter (Signed)
 PCP put the order in if Dr. Alford Highland office will do the labs is up to them, will route to Dr. Shirlee Latch for review

## 2023-12-17 NOTE — Telephone Encounter (Signed)
 Called to confirm/remind patient of their appointment at [facility] on 12/18/23.   Appointment:   [x] Confirmed  [] Left mess   [] No answer/No voice mail  Patient reminded to bring all medications and/or complete list.  Confirmed patient has transportation. Gave directions, instructed to utilize valet parking.

## 2023-12-18 ENCOUNTER — Ambulatory Visit (HOSPITAL_COMMUNITY)
Admission: RE | Admit: 2023-12-18 | Discharge: 2023-12-18 | Disposition: A | Discharge status: Home / Self Care | Payer: Medicare Other | Source: Ambulatory Visit | Attending: Family Medicine | Admitting: Family Medicine

## 2023-12-18 ENCOUNTER — Encounter (HOSPITAL_COMMUNITY): Payer: Self-pay

## 2023-12-18 VITALS — BP 108/64 | HR 90 | Wt 107.8 lb

## 2023-12-18 DIAGNOSIS — C3490 Malignant neoplasm of unspecified part of unspecified bronchus or lung: Secondary | ICD-10-CM

## 2023-12-18 DIAGNOSIS — I6522 Occlusion and stenosis of left carotid artery: Secondary | ICD-10-CM | POA: Diagnosis not present

## 2023-12-18 DIAGNOSIS — I110 Hypertensive heart disease with heart failure: Secondary | ICD-10-CM | POA: Insufficient documentation

## 2023-12-18 DIAGNOSIS — Z79899 Other long term (current) drug therapy: Secondary | ICD-10-CM | POA: Insufficient documentation

## 2023-12-18 DIAGNOSIS — I739 Peripheral vascular disease, unspecified: Secondary | ICD-10-CM | POA: Diagnosis not present

## 2023-12-18 DIAGNOSIS — Z955 Presence of coronary angioplasty implant and graft: Secondary | ICD-10-CM | POA: Diagnosis not present

## 2023-12-18 DIAGNOSIS — I255 Ischemic cardiomyopathy: Secondary | ICD-10-CM | POA: Diagnosis not present

## 2023-12-18 DIAGNOSIS — Z8673 Personal history of transient ischemic attack (TIA), and cerebral infarction without residual deficits: Secondary | ICD-10-CM | POA: Diagnosis not present

## 2023-12-18 DIAGNOSIS — Z9221 Personal history of antineoplastic chemotherapy: Secondary | ICD-10-CM | POA: Insufficient documentation

## 2023-12-18 DIAGNOSIS — Z720 Tobacco use: Secondary | ICD-10-CM | POA: Diagnosis not present

## 2023-12-18 DIAGNOSIS — Z8249 Family history of ischemic heart disease and other diseases of the circulatory system: Secondary | ICD-10-CM | POA: Diagnosis not present

## 2023-12-18 DIAGNOSIS — I2510 Atherosclerotic heart disease of native coronary artery without angina pectoris: Secondary | ICD-10-CM | POA: Diagnosis not present

## 2023-12-18 DIAGNOSIS — Z801 Family history of malignant neoplasm of trachea, bronchus and lung: Secondary | ICD-10-CM | POA: Diagnosis not present

## 2023-12-18 DIAGNOSIS — Z7902 Long term (current) use of antithrombotics/antiplatelets: Secondary | ICD-10-CM | POA: Diagnosis not present

## 2023-12-18 DIAGNOSIS — Z95810 Presence of automatic (implantable) cardiac defibrillator: Secondary | ICD-10-CM | POA: Insufficient documentation

## 2023-12-18 DIAGNOSIS — F17210 Nicotine dependence, cigarettes, uncomplicated: Secondary | ICD-10-CM | POA: Diagnosis not present

## 2023-12-18 DIAGNOSIS — I951 Orthostatic hypotension: Secondary | ICD-10-CM | POA: Insufficient documentation

## 2023-12-18 DIAGNOSIS — Z7982 Long term (current) use of aspirin: Secondary | ICD-10-CM | POA: Diagnosis not present

## 2023-12-18 DIAGNOSIS — Z923 Personal history of irradiation: Secondary | ICD-10-CM | POA: Diagnosis not present

## 2023-12-18 DIAGNOSIS — I25708 Atherosclerosis of coronary artery bypass graft(s), unspecified, with other forms of angina pectoris: Secondary | ICD-10-CM | POA: Diagnosis not present

## 2023-12-18 DIAGNOSIS — I5022 Chronic systolic (congestive) heart failure: Secondary | ICD-10-CM

## 2023-12-18 DIAGNOSIS — I6523 Occlusion and stenosis of bilateral carotid arteries: Secondary | ICD-10-CM

## 2023-12-18 DIAGNOSIS — D509 Iron deficiency anemia, unspecified: Secondary | ICD-10-CM | POA: Diagnosis not present

## 2023-12-18 DIAGNOSIS — I472 Ventricular tachycardia, unspecified: Secondary | ICD-10-CM

## 2023-12-18 DIAGNOSIS — Z951 Presence of aortocoronary bypass graft: Secondary | ICD-10-CM | POA: Diagnosis not present

## 2023-12-18 LAB — BASIC METABOLIC PANEL
Anion gap: 12 (ref 5–15)
BUN: 23 mg/dL (ref 8–23)
CO2: 28 mmol/L (ref 22–32)
Calcium: 9.5 mg/dL (ref 8.9–10.3)
Chloride: 96 mmol/L — ABNORMAL LOW (ref 98–111)
Creatinine, Ser: 0.97 mg/dL (ref 0.44–1.00)
GFR, Estimated: >60 mL/min (ref >60)
Glucose, Bld: 141 mg/dL — ABNORMAL HIGH (ref 70–99)
Potassium: 3.7 mmol/L (ref 3.5–5.1)
Sodium: 136 mmol/L (ref 135–145)

## 2023-12-18 LAB — BRAIN NATRIURETIC PEPTIDE: B Natriuretic Peptide: 91.8 pg/mL (ref 0.0–100.0)

## 2023-12-18 LAB — DIGOXIN LEVEL: Digoxin Level: 0.9 ng/mL (ref 0.8–2.0)

## 2023-12-18 MED ORDER — METOLAZONE 2.5 MG PO TABS: ORAL_TABLET | ORAL | Status: AC

## 2023-12-18 MED ORDER — EZETIMIBE 10 MG PO TABS: 10.0000 mg | ORAL_TABLET | Freq: Every day | ORAL | 3 refills | Status: AC

## 2023-12-18 MED ORDER — POTASSIUM CHLORIDE CRYS ER 20 MEQ PO TBCR: 100.0000 meq | EXTENDED_RELEASE_TABLET | Freq: Every day | ORAL | 3 refills | Status: DC

## 2023-12-18 MED ORDER — RANOLAZINE ER 1000 MG PO TB12: 1000.0000 mg | ORAL_TABLET | Freq: Two times a day (BID) | ORAL | 3 refills | Status: AC

## 2023-12-18 MED ORDER — MEXILETINE HCL 150 MG PO CAPS: 150.0000 mg | ORAL_CAPSULE | Freq: Two times a day (BID) | ORAL | 3 refills | Status: AC

## 2023-12-18 MED ORDER — TORSEMIDE 20 MG PO TABS: ORAL_TABLET | ORAL | 3 refills | Status: AC

## 2023-12-18 NOTE — Telephone Encounter (Signed)
 Appt notes updated

## 2023-12-18 NOTE — Addendum Note (Signed)
 Encounter addended by: Nicole Cella, RN on: 12/18/2023 4:07 PM  Actions taken: Order list changed

## 2023-12-18 NOTE — Patient Instructions (Signed)
 Great to see you today!!!  Medication Changes:  Take an extra 40 meq (2 tabs) of Potassium twice a week when you see Metolazone  Lab Work:  Labs done today, your results will be available in MyChart, we will contact you for abnormal readings.  Special Instructions // Education:  Do the following things EVERYDAY: Weigh yourself in the morning before breakfast. Write it down and keep it in a log. Take your medicines as prescribed Eat low salt foods--Limit salt (sodium) to 2000 mg per day.  Stay as active as you can everyday Limit all fluids for the day to less than 2 liters   Follow-Up in: 6 months (Sept), **PLEASE CALL OUR OFFICE IN JULY TO SCHEDULE THIS APPOINTMENT   At the [facility], you and your health needs are our priority. We have a designated team specialized in the treatment of Heart Failure. This Care Team includes your primary Heart Failure Specialized Cardiologist (physician), Advanced Practice Providers (APPs- Physician Assistants and Nurse Practitioners), and Pharmacist who all work together to provide you with the care you need, when you need it.   You may see any of the following providers on your designated Care Team at your next follow up:  Dr. Arvilla Meres Dr. Marca Ancona Dr. Dorthula Nettles Dr. Theresia Bough Tonye Becket, NP Robbie Lis, [place] [facility] [place], [place] Brynda Peon, NP [place] Lee, NP Karle Plumber, PharmD   Please be sure to bring in all your medications bottles to every appointment.   Need to Contact us:  If you have any questions or concerns before your next appointment please send [place] a message through [place] or call our office at [phone].    TO LEAVE A MESSAGE FOR THE NURSE SELECT OPTION 2, PLEASE LEAVE A MESSAGE INCLUDING: YOUR NAME DATE OF BIRTH CALL BACK NUMBER REASON FOR CALL**this is important as we prioritize the call backs  YOU WILL RECEIVE A CALL BACK THE SAME DAY AS LONG AS YOU CALL  BEFORE 4:00 PM

## 2023-12-20 ENCOUNTER — Telehealth (HOSPITAL_COMMUNITY): Payer: Self-pay

## 2023-12-20 DIAGNOSIS — I5022 Chronic systolic (congestive) heart failure: Secondary | ICD-10-CM

## 2023-12-20 NOTE — Telephone Encounter (Signed)
-----   Message from Anderson Malta [place] sent at 12/20/2023  7:58 AM EDT ----- Labs stable but digoxin level mildly elevated. Please repeat dig level as a trough

## 2023-12-20 NOTE — Telephone Encounter (Signed)
 Patient's labs has been placed and appointment scheduled. Patient will hold her digoxin medication morning of her labs. Pt aware, agreeable, and verbalized understanding.

## 2023-12-25 ENCOUNTER — Encounter: Payer: Self-pay | Admitting: Cardiology

## 2023-12-30 ENCOUNTER — Ambulatory Visit (HOSPITAL_COMMUNITY)
Admission: RE | Admit: 2023-12-30 | Discharge: 2023-12-30 | Disposition: A | Discharge status: Home / Self Care | Source: Ambulatory Visit | Attending: Internal Medicine | Admitting: Internal Medicine

## 2023-12-30 DIAGNOSIS — I5022 Chronic systolic (congestive) heart failure: Secondary | ICD-10-CM | POA: Insufficient documentation

## 2023-12-30 LAB — DIGOXIN LEVEL: Digoxin Level: 0.7 ng/mL — ABNORMAL LOW (ref 0.8–2.0)

## 2024-01-21 ENCOUNTER — Ambulatory Visit: Payer: Medicare Other

## 2024-01-21 DIAGNOSIS — I255 Ischemic cardiomyopathy: Secondary | ICD-10-CM | POA: Diagnosis not present

## 2024-01-21 DIAGNOSIS — E041 Nontoxic single thyroid nodule: Secondary | ICD-10-CM | POA: Diagnosis not present

## 2024-01-21 DIAGNOSIS — E7800 Pure hypercholesterolemia, unspecified: Secondary | ICD-10-CM | POA: Diagnosis not present

## 2024-01-21 DIAGNOSIS — E119 Type 2 diabetes mellitus without complications: Secondary | ICD-10-CM | POA: Diagnosis not present

## 2024-01-22 LAB — CUP PACEART REMOTE DEVICE CHECK
Battery Remaining Longevity: 138 mo
Battery Remaining Percentage: 97 %
Brady Statistic RV Percent Paced: 0 %
Date Time Interrogation Session: 20250422041200
HighPow Impedance: 80 Ohm
Implantable Lead Connection Status: 753985
Implantable Lead Implant Date: 20211015
Implantable Lead Location: 753860
Implantable Lead Model: 672
Implantable Lead Serial Number: 166771
Implantable Pulse Generator Implant Date: 20211015
Lead Channel Impedance Value: 947 Ohm
Lead Channel Setting Pacing Amplitude: 3.5 V
Lead Channel Setting Pacing Pulse Width: 0.4 ms
Lead Channel Setting Sensing Sensitivity: 0.5 mV
Pulse Gen Serial Number: 279742
Zone Setting Status: 755011

## 2024-01-22 LAB — LAB REPORT - SCANNED: EGFR: 64

## 2024-01-27 ENCOUNTER — Inpatient Hospital Stay: Payer: Medicare Other

## 2024-01-27 DIAGNOSIS — I10 Essential (primary) hypertension: Secondary | ICD-10-CM | POA: Diagnosis not present

## 2024-01-27 DIAGNOSIS — E1165 Type 2 diabetes mellitus with hyperglycemia: Secondary | ICD-10-CM | POA: Diagnosis not present

## 2024-01-27 DIAGNOSIS — E041 Nontoxic single thyroid nodule: Secondary | ICD-10-CM | POA: Diagnosis not present

## 2024-01-27 DIAGNOSIS — E1121 Type 2 diabetes mellitus with diabetic nephropathy: Secondary | ICD-10-CM | POA: Diagnosis not present

## 2024-01-27 DIAGNOSIS — E7800 Pure hypercholesterolemia, unspecified: Secondary | ICD-10-CM | POA: Diagnosis not present

## 2024-01-27 DIAGNOSIS — G629 Polyneuropathy, unspecified: Secondary | ICD-10-CM | POA: Diagnosis not present

## 2024-01-27 DIAGNOSIS — Z780 Asymptomatic menopausal state: Secondary | ICD-10-CM | POA: Diagnosis not present

## 2024-01-27 DIAGNOSIS — I509 Heart failure, unspecified: Secondary | ICD-10-CM | POA: Diagnosis not present

## 2024-01-28 ENCOUNTER — Ambulatory Visit (HOSPITAL_COMMUNITY)
Admission: RE | Admit: 2024-01-28 | Discharge: 2024-01-28 | Disposition: A | Discharge status: Home / Self Care | Source: Ambulatory Visit | Attending: Internal Medicine | Admitting: Internal Medicine

## 2024-01-28 ENCOUNTER — Inpatient Hospital Stay: Attending: Internal Medicine

## 2024-01-28 DIAGNOSIS — D509 Iron deficiency anemia, unspecified: Secondary | ICD-10-CM | POA: Diagnosis not present

## 2024-01-28 DIAGNOSIS — I700 Atherosclerosis of aorta: Secondary | ICD-10-CM | POA: Diagnosis not present

## 2024-01-28 DIAGNOSIS — C3490 Malignant neoplasm of unspecified part of unspecified bronchus or lung: Secondary | ICD-10-CM | POA: Insufficient documentation

## 2024-01-28 DIAGNOSIS — Z85118 Personal history of other malignant neoplasm of bronchus and lung: Secondary | ICD-10-CM | POA: Diagnosis not present

## 2024-01-28 DIAGNOSIS — Z95828 Presence of other vascular implants and grafts: Secondary | ICD-10-CM

## 2024-01-28 DIAGNOSIS — J8410 Pulmonary fibrosis, unspecified: Secondary | ICD-10-CM | POA: Diagnosis not present

## 2024-01-28 DIAGNOSIS — I7121 Aneurysm of the ascending aorta, without rupture: Secondary | ICD-10-CM | POA: Diagnosis not present

## 2024-01-28 LAB — CBC WITH DIFFERENTIAL (CANCER CENTER ONLY)
Abs Immature Granulocytes: 0.02 10*3/uL (ref 0.00–0.07)
Basophils Absolute: 0.1 10*3/uL (ref 0.0–0.1)
Basophils Relative: 1 %
Eosinophils Absolute: 0.1 10*3/uL (ref 0.0–0.5)
Eosinophils Relative: 1 %
HCT: 42.4 % (ref 36.0–46.0)
Hemoglobin: 14.9 g/dL (ref 12.0–15.0)
Immature Granulocytes: 0 %
Lymphocytes Relative: 18 %
Lymphs Abs: 1.3 10*3/uL (ref 0.7–4.0)
MCH: 30.2 pg (ref 26.0–34.0)
MCHC: 35.1 g/dL (ref 30.0–36.0)
MCV: 85.8 fL (ref 80.0–100.0)
Monocytes Absolute: 0.8 10*3/uL (ref 0.1–1.0)
Monocytes Relative: 12 %
Neutro Abs: 4.7 10*3/uL (ref 1.7–7.7)
Neutrophils Relative %: 68 %
Platelet Count: 229 10*3/uL (ref 150–400)
RBC: 4.94 MIL/uL (ref 3.87–5.11)
RDW: 13.3 % (ref 11.5–15.5)
WBC Count: 6.9 10*3/uL (ref 4.0–10.5)
nRBC: 0 % (ref 0.0–0.2)

## 2024-01-28 LAB — CMP (CANCER CENTER ONLY)
ALT: 10 U/L (ref 0–44)
AST: 14 U/L — ABNORMAL LOW (ref 15–41)
Albumin: 4.5 g/dL (ref 3.5–5.0)
Alkaline Phosphatase: 57 U/L (ref 38–126)
Anion gap: 8 (ref 5–15)
BUN: 17 mg/dL (ref 8–23)
CO2: 31 mmol/L (ref 22–32)
Calcium: 9.6 mg/dL (ref 8.9–10.3)
Chloride: 90 mmol/L — ABNORMAL LOW (ref 98–111)
Creatinine: 0.84 mg/dL (ref 0.44–1.00)
GFR, Estimated: >60 mL/min (ref >60)
Glucose, Bld: 152 mg/dL — ABNORMAL HIGH (ref 70–99)
Potassium: 3.6 mmol/L (ref 3.5–5.1)
Sodium: 129 mmol/L — ABNORMAL LOW (ref 135–145)
Total Bilirubin: 0.5 mg/dL (ref 0.0–1.2)
Total Protein: 7.3 g/dL (ref 6.5–8.1)

## 2024-01-28 LAB — FERRITIN: Ferritin: 17 ng/mL (ref 11–307)

## 2024-01-28 LAB — IRON AND IRON BINDING CAPACITY (CC-WL,HP ONLY)
Iron: 65 ug/dL (ref 28–170)
Saturation Ratios: 10 % — ABNORMAL LOW (ref 10.4–31.8)
TIBC: 661 ug/dL — ABNORMAL HIGH (ref 250–450)
UIBC: 596 ug/dL — ABNORMAL HIGH (ref 148–442)

## 2024-01-28 MED ORDER — IOHEXOL 300 MG/ML  SOLN
75.0000 mL | Freq: Once | INTRAMUSCULAR | Status: AC | PRN
  Administered 2024-01-28: 75 mL via INTRAVENOUS

## 2024-01-28 MED ORDER — SODIUM CHLORIDE 0.9% FLUSH
10.0000 mL | Freq: Once | INTRAVENOUS | Status: AC
Start: 2024-01-28 — End: 2024-01-28
  Administered 2024-01-28: 10 mL

## 2024-01-28 MED ORDER — HEPARIN SOD (PORK) LOCK FLUSH 100 UNIT/ML IV SOLN
500.0000 [IU] | Freq: Once | INTRAVENOUS | Status: AC
  Administered 2024-01-28: 500 [IU] via INTRAVENOUS

## 2024-01-31 ENCOUNTER — Encounter: Payer: Self-pay | Admitting: Family Medicine

## 2024-02-03 ENCOUNTER — Inpatient Hospital Stay: Payer: Medicare Other | Attending: Internal Medicine | Admitting: Internal Medicine

## 2024-02-03 VITALS — BP 109/76 | HR 93 | Temp 98.2°F | Resp 16 | Ht <= 58 in | Wt 111.0 lb

## 2024-02-03 DIAGNOSIS — Z85118 Personal history of other malignant neoplasm of bronchus and lung: Secondary | ICD-10-CM | POA: Insufficient documentation

## 2024-02-03 DIAGNOSIS — D509 Iron deficiency anemia, unspecified: Secondary | ICD-10-CM | POA: Insufficient documentation

## 2024-02-03 DIAGNOSIS — Z9221 Personal history of antineoplastic chemotherapy: Secondary | ICD-10-CM | POA: Insufficient documentation

## 2024-02-03 DIAGNOSIS — E871 Hypo-osmolality and hyponatremia: Secondary | ICD-10-CM | POA: Insufficient documentation

## 2024-02-03 DIAGNOSIS — C3490 Malignant neoplasm of unspecified part of unspecified bronchus or lung: Secondary | ICD-10-CM | POA: Diagnosis not present

## 2024-02-03 NOTE — Progress Notes (Signed)
 [facility] [phone]   [phone]  OFFICE PROGRESS NOTE  Tower, Manley Seeds, MD [address] [place] [address]  DIAGNOSIS:  1) Limited stage (T3, N2, M0) small cell lung cancer presented with right hilar and mediastinal lymphadenopathy. There was no hypermetabolism in the left adrenal gland lesion. Diagnosed in December 2022. 2)  Iron  Deficiency Anemia   PRIOR THERAPY:  1) Systemic chemotherapy with systemic chemotherapy with carboplatin  for an AUC of 5, etoposide  100 mg per metered squared, and Imfinzi  1500 mg IV every 3 weeks with Cosela  for myeloprotection.  First dose on 10/16/2021.  Discontinued after 1 cycle as the patient staging PET scan showed limited stage disease. 2) systemic chemotherapy with carboplatin  for an AUC of 5 on day 1 and etoposide  100 mg/m2 on days 1, 2, and 3 IV every 3 weeks.  First dose on 11/06/2021.  This will be concurrent with radiotherapy under the care of Dr. Eloise Hake.  Status post 3 cycles. 3) Venofer  infusions as needed. Last dose on 02/07/21    CURRENT THERAPY: Observation  INTERVAL HISTORY: Diane [name] 68 y.o. female returns to the clinic today for follow-up visit. Discussed the use of AI scribe software for clinical note transcription with the patient, who gave verbal consent to proceed.  History of Present Illness   Diane [name] is a 68 year old female with limited stage small cell lung cancer who presents for evaluation with repeat CT scan and blood work.  She was diagnosed with limited stage small cell lung cancer in December 2022 and underwent systemic chemotherapy with carboplatin  and etoposide  concurrent with radiation. She is currently in an observation period and is here for evaluation with a repeat CT scan of the chest and blood work, including iron  study and ferritin.  She experiences persistent fatigue despite a normal hemoglobin level of 14.9 and hematocrit of 42.4, indicating no current  anemia. Her serum iron  is 65, and her iron  saturation is slightly low. She has a history of iron  deficiency anemia, which is being closely monitored.  She has a longstanding issue with balance since the age of 52. Additionally, her sodium level is low at 129, which may be related to her high fluid intake and use of diuretics. Her current medications include diuretics and additional potassium.       MEDICAL HISTORY: Past Medical History:  Diagnosis Date   Anemia    Arthritis    Carotid stenosis    a. s/p right carotid stent 10/2017.   Chronic systolic CHF (congestive heart failure) (HCC)    Colon polyps    COPD (chronic obstructive pulmonary disease) (HCC)    Coronary artery disease    post CABG in 3/07 , coronary stents    Depression    after major stroke years ago, no longer treated   Diabetes mellitus    type 2   Dyslipidemia    Fatty liver    Fibromyalgia    GERD (gastroesophageal reflux disease)    Headache    hx of    History of kidney stones    History of radiation therapy    right lung 11/15/2021-12/26/2021  Dr Retta Caster   Hyperlipidemia    Hypertension    Lung cancer [facility])    Myocardial infarction (HCC)    Pneumonia    hx of    Seizures (HCC)    last seizure- 03/2013    Shortness of breath dyspnea  with exertion or when fluid builds up    Sleep apnea    used to wear a cpap- not used in 3 years    Status post dilation of esophageal narrowing    Stroke [facility]) 1993   problems with balance    Systolic murmur    known mild AS and MR   Thyroid  goiter    Tobacco abuse     ALLERGIES:  is allergic to bee venom, benadryl  [diphenhydramine ], entresto  [sacubitril -valsartan ], farxiga  [dapagliflozin ], tetracycline, canagliflozin, sitagliptin, and ace inhibitors.  MEDICATIONS:  Current Outpatient Medications  Medication Sig Dispense Refill   acetaminophen  (TYLENOL ) 500 MG tablet Take 1,000 mg by mouth at bedtime.     albuterol  (VENTOLIN  HFA) 108 (90 Base) MCG/ACT  inhaler INHALE 2 PUFFS EVERY 6 HOURS AS NEEDED FOR WHEEZING/SHORTNESS OF BREATH 18 each 0   aspirin  EC 81 MG tablet Take 81 mg by mouth every evening.  30 tablet 6   BD PEN NEEDLE NANO 2ND GEN 32G X 4 MM MISC Inject into the skin 3 (three) times daily.     clopidogrel  (PLAVIX ) 75 MG tablet TAKE 1 TABLET DAILY 90 tablet 3   digoxin  (LANOXIN ) 0.125 MG tablet TAKE ONE-HALF (1/2) TABLET DAILY 45 tablet 3   ezetimibe  (ZETIA ) 10 MG tablet Take 1 tablet (10 mg total) by mouth daily. 90 tablet 3   fluticasone  (FLONASE ) 50 MCG/ACT nasal spray Place 2 sprays into both nostrils daily. 48 g 3   gabapentin  (NEURONTIN ) 300 MG capsule TAKE 2 CAPSULES DAILY AT   BEDTIME 180 capsule 3   glucose blood (ONETOUCH ULTRA) test strip 3 (three) times daily.     guaiFENesin  (MUCINEX ) 600 MG 12 hr tablet Take 2 tablets (1,200 mg total) by mouth 2 (two) times daily. 360 tablet 3   HUMULIN 70/30 KWIKPEN (70-30) 100 UNIT/ML KwikPen SMARTSIG:55 Unit(s) SUB-Q Twice Daily     icosapent  Ethyl (VASCEPA ) 1 g capsule Take 1 g by mouth 2 (two) times daily.     levETIRAcetam  (KEPPRA ) 1000 MG tablet Take 1 tablet (1,000 mg total) by mouth 2 (two) times daily. 180 tablet 3   Magnesium  400 MG TABS Take 400 mg by mouth 2 (two) times daily. 180 tablet 3   metFORMIN  (GLUCOPHAGE ) 500 MG tablet Take 500 mg by mouth 2 (two) times daily with a meal.     metolazone  (ZAROXOLYN ) 2.5 MG tablet TAKE 1 TABLET TWICE WEEKLY,EVERY MONDAY AND FRIDAY    (CHANGE IN DOSAGE).     mexiletine (MEXITIL) 150 MG capsule Take 1 capsule (150 mg total) by mouth 2 (two) times daily. 180 capsule 3   nitroGLYCERIN  (NITROLINGUAL ) 0.4 MG/SPRAY spray Place 1 spray under the tongue every 5 (five) minutes x 3 doses as needed for chest pain. 12 g 3   nystatin  (MYCOSTATIN /NYSTOP ) powder APPLY 1 APPLICATION        TOPICALLY 4 TIMES A DAY AS NEEDED FOR YEAST INFECTIONS 60 g 5   ONE TOUCH ULTRA TEST test strip USE AS DIRECTED FOR TESTING BLOOD GLUCOSE 3 TIMES DAILY  1    pantoprazole  (PROTONIX ) 40 MG tablet Take 1 tablet (40 mg total) by mouth daily. 90 tablet 3   polyethylene glycol (MIRALAX  / GLYCOLAX ) 17 g packet Take 17 g by mouth daily.     potassium chloride  SA (KLOR-CON  M) 20 MEQ tablet Take 5 tablets (100 mEq total) by mouth daily. Take an extra 40 meq (2 tabs) twice a week with metolazone  160 tablet 3   ranolazine  (RANEXA ) 1000  MG SR tablet Take 1 tablet (1,000 mg total) by mouth 2 (two) times daily. 180 tablet 3   rosuvastatin  (CRESTOR ) 20 MG tablet Take 1 tablet (20 mg total) by mouth daily. 90 tablet 3   sodium chloride  (OCEAN) 0.65 % SOLN nasal spray Place 1 spray into both nostrils as needed for congestion.     spironolactone  (ALDACTONE ) 25 MG tablet Take 1 tablet (25 mg total) by mouth at bedtime. 90 tablet 3   torsemide  (DEMADEX ) 20 MG tablet Take 4 tablets (80 mg total) by mouth every morning AND 3 tablets (60 mg total) every evening. 540 tablet 3   triamcinolone  cream (KENALOG ) 0.5 % Apply 1 application topically 3 (three) times daily as needed (rash). To affected areas     Vericiguat  (VERQUVO ) 10 MG TABS Take 1 tablet (10 mg total) by mouth daily. 90 tablet 3   No current facility-administered medications for this visit.    SURGICAL HISTORY:  Past Surgical History:  Procedure Laterality Date   ABDOMINAL AORTOGRAM W/LOWER EXTREMITY N/A 04/18/2020   Procedure: ABDOMINAL AORTOGRAM W/LOWER EXTREMITY;  Surgeon: Avanell Leigh, MD;  Location: [facility]AS[facility];  Service: Cardiovascular;  Laterality: N/A;   APPENDECTOMY     BIOPSY  02/03/2019   Procedure: BIOPSY;  Surgeon: Asencion Blacksmith, MD;  Location: [facility] ENDOSCOPY;  Service: Endoscopy;;   BREAST BIOPSY Left 2016   BRONCHIAL BIOPSY  09/18/2021   Procedure: BRONCHIAL BIOPSIES;  Surgeon: Denson Flake, MD;  Location: [facility] ENDOSCOPY;  Service: Pulmonary;;   BRONCHIAL BRUSHINGS  09/18/2021   Procedure: BRONCHIAL BRUSHINGS;  Surgeon: Denson Flake, MD;  Location: [facility] ENDOSCOPY;  Service:  Pulmonary;;   BRONCHIAL NEEDLE ASPIRATION BIOPSY  09/18/2021   Procedure: BRONCHIAL NEEDLE ASPIRATION BIOPSIES;  Surgeon: Denson Flake, MD;  Location: [facility] ENDOSCOPY;  Service: Pulmonary;;   CARDIAC CATHETERIZATION  11/29/05   EF of 55%   CARDIAC CATHETERIZATION  08/06/06   EF of 45-50%   CARDIAC CATHETERIZATION N/A 05/06/2015   Procedure: Right/Left Heart Cath and Coronary/Graft Angiography;  Surgeon: Peter M Swaziland, MD;  Location: [facility];  Service: Cardiovascular;  Laterality: N/A;   CAROTID PTA/STENT INTERVENTION N/A 10/10/2017   Procedure: CAROTID PTA/STENT INTERVENTION - Right;  Surgeon: Margherita Shell, MD;  Location: [facility];  Service: Cardiovascular;  Laterality: N/A;   CERVICAL FUSION  1990   CHOLECYSTECTOMY     COLON RESECTION     mass removed and 4 in of colon   COLONOSCOPY WITH PROPOFOL  N/A 07/16/2017   Procedure: COLONOSCOPY WITH PROPOFOL ;  Surgeon: Asencion Blacksmith, MD;  Location: [facility] ENDOSCOPY;  Service: Endoscopy;  Laterality: N/A;   CORONARY ARTERY BYPASS GRAFT  12/04/2005   x5 -- left internal mammary artery to the LAD, left radial artery to the ramus intermedius, saphenous vein graft to the obtuse marginal 1, sequential saphenous vein grat to the acute marginal and posterior descending, endoscopic vein harvesting from the left thigh with open vein harvest from right leg   CORONARY STENT PLACEMENT  08/11/06   PCI of her ciurcumflex/OM vessel   ESOPHAGOGASTRODUODENOSCOPY (EGD) WITH PROPOFOL  N/A 02/03/2019   Procedure: ESOPHAGOGASTRODUODENOSCOPY (EGD) WITH PROPOFOL ;  Surgeon: Asencion Blacksmith, MD;  Location: [facility] ENDOSCOPY;  Service: Endoscopy;  Laterality: N/A;   FINE NEEDLE ASPIRATION  09/18/2021   Procedure: FINE NEEDLE ASPIRATION (FNA) LINEAR;  Surgeon: Denson Flake, MD;  Location: [facility] ENDOSCOPY;  Service: Pulmonary;;   ICD IMPLANT N/A 07/15/2020   Procedure: ICD IMPLANT;  Surgeon: Boyce Byes, MD;  Location: [name] INVASIVE CV LAB;  Service:  Cardiovascular;  Laterality: N/A;   IR IMAGING GUIDED PORT INSERTION  10/20/2021   LAPAROTOMY Bilateral 05/19/2015   Procedure: EXPLORATORY LAPAROTOMY WITH BILATERAL SALPINGO OOPHORECTOMY /OMENTECTOMY/SEGMENTAL SIGMOID COLECTOMY ;  Surgeon: Alphonso Aschoff, MD;  Location: [facility] ORS;  Service: Gynecology;  Laterality: Bilateral;   PERIPHERAL VASCULAR BALLOON ANGIOPLASTY  04/18/2020   Procedure: PERIPHERAL VASCULAR BALLOON ANGIOPLASTY;  Surgeon: Avanell Leigh, MD;  Location: [facility];  Service: Cardiovascular;;  rt SFA  atherectomy and DCB   PERIPHERAL VASCULAR CATHETERIZATION N/A 11/15/2015   Procedure: Carotid PTA/Stent Intervention;  Surgeon: Avanell Leigh, MD;  Location: [facility];  Service: Cardiovascular;  Laterality: N/A;   PERIPHERAL VASCULAR CATHETERIZATION  11/15/2015   Procedure: Carotid Angiography;  Surgeon: Avanell Leigh, MD;  Location: [facility] INVASIVE CV LAB;  Service: Cardiovascular;;   RIGHT HEART CATH N/A 01/27/2020   Procedure: RIGHT HEART CATH;  Surgeon: Darlis Eisenmenger, MD;  Location: [facility] INVASIVE CV LAB;  Service: Cardiovascular;  Laterality: N/A;   VIDEO BRONCHOSCOPY WITH ENDOBRONCHIAL ULTRASOUND N/A 09/18/2021   Procedure: VIDEO BRONCHOSCOPY WITH ENDOBRONCHIAL ULTRASOUND;  Surgeon: Denson Flake, MD;  Location: [facility] ENDOSCOPY;  Service: Pulmonary;  Laterality: N/A;   VIDEO BRONCHOSCOPY WITH RADIAL ENDOBRONCHIAL ULTRASOUND  09/18/2021   Procedure: RADIAL ENDOBRONCHIAL ULTRASOUND;  Surgeon: Denson Flake, MD;  Location: [facility] ENDOSCOPY;  Service: Pulmonary;;    REVIEW OF SYSTEMS:  Constitutional: positive for fatigue Eyes: negative Ears, nose, mouth, throat, and face: negative Respiratory: negative Cardiovascular: negative Gastrointestinal: negative Genitourinary:negative Integument/breast: negative Hematologic/lymphatic: negative Musculoskeletal:negative Neurological: negative Behavioral/Psych: negative Endocrine: negative Allergic/Immunologic: negative    PHYSICAL EXAMINATION: General appearance: alert, cooperative, appears stated age, fatigued, and no distress Head: Normocephalic, without obvious abnormality, atraumatic Neck: no adenopathy, no JVD, supple, symmetrical, trachea midline, and thyroid  not enlarged, symmetric, no tenderness/mass/nodules Lymph nodes: Cervical, supraclavicular, and axillary nodes normal. Resp: clear to auscultation bilaterally Back: symmetric, no curvature. ROM normal. No CVA tenderness. Cardio: regular rate and rhythm, S1, S2 normal, no murmur, click, rub or gallop GI: soft, non-tender; bowel sounds normal; no masses,  no organomegaly Extremities: extremities normal, atraumatic, no cyanosis or edema Neurologic: Alert and oriented X 3, normal strength and tone. Normal symmetric reflexes. Normal coordination and gait  ECOG PERFORMANCE STATUS: 1 - Symptomatic but completely ambulatory  Blood pressure 109/76, pulse 93, temperature 98.2 F (36.8 C), temperature source Temporal, resp. rate 16, height 4' 9.5" (1.461 m), weight 111 lb (50.3 kg), last menstrual period 09/02/2003, SpO2 100%.  LABORATORY DATA: Lab Results  Component Value Date   WBC 6.9 01/28/2024   HGB 14.9 01/28/2024   HCT 42.4 01/28/2024   MCV 85.8 01/28/2024   PLT 229 01/28/2024      Chemistry      Component Value Date/Time   NA 129 (L) 01/28/2024 1343   NA 135 03/26/2023 1310   NA 135 (L) 02/15/2017 1457   K 3.6 01/28/2024 1343   K 4.8 02/15/2017 1457   CL 90 (L) 01/28/2024 1343   CO2 31 01/28/2024 1343   CO2 26 02/15/2017 1457   BUN 17 01/28/2024 1343   BUN 28 (H) 03/26/2023 1310   BUN 10.8 02/15/2017 1457   CREATININE 0.84 01/28/2024 1343   CREATININE 0.69 05/06/2017 1405   CREATININE 0.7 02/15/2017 1457      Component Value Date/Time   CALCIUM  9.6 01/28/2024 1343   CALCIUM  9.3 02/15/2017 1457   ALKPHOS  57 01/28/2024 1343   ALKPHOS 75 02/15/2017 1457   AST 14 (L) 01/28/2024 1343   AST 9 02/15/2017 1457   ALT 10 01/28/2024  1343   ALT 13 02/15/2017 1457   BILITOT 0.5 01/28/2024 1343   BILITOT 0.28 02/15/2017 1457       RADIOGRAPHIC STUDIES: CT Chest W Contrast Result Date: 02/01/2024 CLINICAL DATA:  Small cell lung cancer. Restaging. * Tracking Code: BO * EXAM: CT CHEST WITH CONTRAST TECHNIQUE: Multidetector CT imaging of the chest was performed during intravenous contrast administration. RADIATION DOSE REDUCTION: This exam was performed according to the departmental dose-optimization program which includes automated exposure control, adjustment of the mA and/or kV according to patient size and/or use of iterative reconstruction technique. CONTRAST:  75mL OMNIPAQUE  IOHEXOL  300 MG/ML  SOLN COMPARISON:  07/31/2023 FINDINGS: Cardiovascular: There is a left chest wall ICD with lead in the right ventricle. Right chest wall port a catheter is identified with tip in the superior cavoatrial junction. Previous median sternotomy and CABG procedure. Normal heart size. No pericardial effusion. Aortic atherosclerotic calcifications. Ascending thoracic aorta is stable measuring 4 cm, image 31/7. Focal penetrating atherosclerotic ulceration involving the descending thoracic aorta is again noted in appears unchanged measuring 1.9 x 0 point 9 cm, image 87/2. Mediastinum/Nodes: 1.1 cm right lobe of thyroid  gland nodule. Not clinically significant; no follow-up imaging recommended . (Ref: J Am Coll Radiol. 2015 Feb;12(2): 143-50).Trachea appears patent and midline. Normal appearance of the esophagus. No enlarged mediastinal or hilar lymph nodes. Lungs/Pleura: No pleural effusion. Right mid lung fibrosis and architectural distortion is identified compatible with changes due to external beam radiation. The appearance is unchanged from the previous exam. No new pulmonary nodule or mass identified. Upper Abdomen: No acute abnormality. Unchanged nodular thickening involving both adrenal glands. Status post cholecystectomy. Musculoskeletal: No chest  wall abnormality. No acute or significant osseous findings. IMPRESSION: 1. Stable CT of the chest. No signs of recurrent or metastatic disease. 2. Stable right mid lung fibrosis and architectural distortion compatible with changes due to external beam radiation. 3. Stable 4 cm ascending thoracic aortic aneurysm. This can be readdressed at the next follow-up exam. 4.  Aortic Atherosclerosis (ICD10-I70.0). Electronically Signed   By: Kimberley Penman M.D.   On: 02/01/2024 13:35   CUP PACEART REMOTE DEVICE CHECK Result Date: 01/22/2024 ICD Scheduled remote reviewed. Normal device function.  Presenting rhythm:  VS Next remote 91 days. LA, CVRS   ASSESSMENT AND PLAN: This is a very pleasant 68 years old white female recently diagnosed with Limited stage (T3, N2, M0) small cell lung cancer presented with right hilar and mediastinal lymphadenopathy. There was no hypermetabolism in the left adrenal gland lesion. Diagnosed in December 2022. She initially started treatment with carboplatin , etoposide , Cosela  and Imfinzi  with the expectation that she had extensive stage disease status post 1 cycle. Staging work-up with PET scan showed no hypermetabolic activity and the suspicious left adrenal gland lesion. Her treatment was switched to chemotherapy with with carboplatin  for AUC of 5 on day 1, etoposide  100 Mg/M2 on days 1, 2 and 3 every 3 weeks concurrent with radiation.  First dose today 11/06/2021.  Status post 3 cycles. She tolerated her treatment well with no concerning adverse effect except for fatigue as well as radiation-induced esophagitis with dysphagia and odynophagia. The patient declined prophylactic cranial irradiation. She is currently on observation and feeling fine except for fatigue. She had repeat CT scan of the chest performed recently.  I personally and independently reviewed  the scan and discussed the result with the patient today.  Her scan showed no concerning findings for disease  progression. Assessment and Plan    Small cell lung cancer, in remission Limited stage small cell lung cancer diagnosed in December 2022, treated with carboplatin , etoposide , and concurrent radiation. Currently in remission with no evidence of disease progression on recent CT scan. Continued monitoring is necessary due to potential recurrence. - Schedule follow-up CT scan in six months.  Hyponatremia Hyponatremia with sodium level of 129, likely due to excessive fluid intake and diuretic use. Reports consuming large amounts of ice, contributing to fluid intake. Advised to restrict fluid intake to prevent further sodium depletion. - Advise fluid restriction to manage hyponatremia. - Monitor sodium levels regularly.     For the iron  deficiency anemia, her iron  studies showed no concerning findings for anemia.Will continue to monitor. The patient was advised to call immediately if she has any other concerning symptoms in the interval.  The patient voices understanding of current disease status and treatment options and is in agreement with the current care plan.  All questions were answered. The patient knows to call the clinic with any problems, questions or concerns. We can certainly see the patient much sooner if necessary. The total time spent in the appointment was 30 minutes.  Disclaimer: This note was dictated with voice recognition software. Similar sounding words can inadvertently be transcribed and may not be corrected upon review.

## 2024-02-10 DIAGNOSIS — H2512 Age-related nuclear cataract, left eye: Secondary | ICD-10-CM | POA: Diagnosis not present

## 2024-02-11 DIAGNOSIS — H2511 Age-related nuclear cataract, right eye: Secondary | ICD-10-CM | POA: Diagnosis not present

## 2024-02-11 DIAGNOSIS — H25041 Posterior subcapsular polar age-related cataract, right eye: Secondary | ICD-10-CM | POA: Diagnosis not present

## 2024-02-11 DIAGNOSIS — H25011 Cortical age-related cataract, right eye: Secondary | ICD-10-CM | POA: Diagnosis not present

## 2024-02-17 ENCOUNTER — Ambulatory Visit: Admitting: Neurology

## 2024-03-05 ENCOUNTER — Other Ambulatory Visit (HOSPITAL_COMMUNITY): Payer: Self-pay | Admitting: Cardiology

## 2024-03-05 MED ORDER — POTASSIUM CHLORIDE CRYS ER 20 MEQ PO TBCR: 100.0000 meq | EXTENDED_RELEASE_TABLET | Freq: Every day | ORAL | 3 refills | Status: AC

## 2024-03-05 NOTE — Addendum Note (Signed)
 Addended by: Lott Rouleau A on: 03/05/2024 03:11 PM   Modules accepted: Orders

## 2024-03-05 NOTE — Progress Notes (Signed)
 Remote ICD transmission.

## 2024-03-09 ENCOUNTER — Other Ambulatory Visit (HOSPITAL_COMMUNITY): Payer: Self-pay | Admitting: Cardiology

## 2024-03-09 DIAGNOSIS — H2511 Age-related nuclear cataract, right eye: Secondary | ICD-10-CM | POA: Diagnosis not present

## 2024-03-09 NOTE — Progress Notes (Unsigned)
 NEUROLOGY FOLLOW UP OFFICE NOTE  Diane [name] [id number]  Assessment/Plan:    Symptomatic focal seizure disorder with impaired consciousness, stable  Cervical radiculopathy, stable   1. Seizure prophylaxis:  Keppra  1000mg  BID filled  2. Neuropathic pain:  gabapentin  600mg  QHS filled  3. Follow up one year    Subjective:  Diane [name] is a 68 year old right-handed woman with CAD s/p CABG, cardiomyopathy, PAF s/p ICD, VT/VF, hypertension, hyperlipidemia, type 2 diabetes mellitus, tobacco use, depression, fibromyalgia, OSA, COPD and history of stroke who follows up for seizure disorder.   UPDATE: Current medications:  Keppra  1000mg  twice daily, gabapentin  600mg  at bedtime (for neck pain)   No spells.  Last spell 03/18/13.   Being treated for lung cancer (SCC).  Finished chemo and radiation.  She is getting a follow up scan next month.    HISTORY: 1.  Seizure disorder: She was admitted to [facility] from 03/03/13 to 03/06/13 for recurrent seizure-like episodes.  These spells are were witnessed by Dr. Gayla Katz, neuro-hospitalist, and described as "head turning to the right as well as eye deviation to the right, stiffening of right extremities with flexion of right upper extremity, and convulsive movements of right extremities. Spell lasted about 40-50 seconds and resolved without intervention. Within 30 seconds patient was again awake and conversant. She was slightly confused with respect to time."  She is unable to speak during these events.  Sometimes she was aware during these events and other times she was unaware and amnestic.  She did have urinary incontinence but no tongue biting.  It is reported that she can write down what she wants to say during these spells.  Given her stroke history, she was evaluated for possible left hemispheric stroke.  By his assessment, it seemed that her speech difficulties were non-organic, seizure-like activity non-organic, and he did not  suspect acute stroke.     She had continuous EEG monitoring, which captured multiple spells without electrographic correlate.  However, she did have one captured clinical event, described as "build up to rhythmical theta activity and resultant slowing companied by clinical changes.  This may very well represent epileptiform activity.  No interictal abnormalities were noted".  Psychiatry was consulted, regarding possible pseudoseizures.  Despite the possible diagnosis of pseudoseizures, the neuro-hospitalists never made a sole diagnosis of pseudoseizures in the hospital notes.     I personally reviewed the EEG.  At 1:57:48, patient pauses, head slightly turned left, staring, with little movement and no convulsions.  She does pull at the covers a little, but no appreciable stereotypical hand movements.  When it is over, she begins moving around again.  Electrographically, she does have a build up of generalized theta activity up until 1:58:26, followed by generalized delta slowing lasting until 1:58:45.  Difficult to lateralize, but may be more prominent in the left hemisphere.  However, this appears to only be a clip of the event and the rest of the study is not available to me.  Therefore I cannot comment on the other captured spells.   She also reports episodes of tremor involving the head, hands and legs.  It is not positional.  It lasts 20 to 30 minutes.  She has had it for many years.  Blood pressure and blood sugar readings are normal.  She says her sister has Parkinson's disease.    Chronic dizziness.   MRI of brain performed on 03/03/13, which reported possible tiny acute infarct in the posterior left frontal lobe,  but neurology believed this to be artifact.  There was also mention of incidental tiny cysts in right hippocampal region.  Encephalomalacia in the right frontoparietal region, from prior remote stroke, is seen.  MRI of brain without contrast from 12/08/15 showed chronic right MCA infarct but  also showed chronic looking lacunar infarcts in the bilateral basal ganglia and left thalamus, which are new compared to prior imaging from June 2014.   Prior AEDs:  Dilantin  ER 100mg    2.  Cervical radiculopathy: She has history of right sided neck pain radiating down to the right shoulder, associated with tingling from the right ear down to the shoulder.  Hot and cold compresses help too.  No numbness, pain or weakness down the arm.  Pain worse when turning neck to the left.  She has trigger finger and numbness in the fingers when driving or picking up objects, thought to be carpal tunnel syndrome.  She has history of cervical fusion in the 1980s.  PT was ineffective.  Aaron Aas  MRI of the cervical spine performed on 05/23/14 showed moderately large foraminal disc protrusion and associated osteophyte at C3-4, causing right foraminal encroachment.  Solid fusion C5 through C7 noted.  She was referred for right C4 nerve root injection, however it was not done.    PAST MEDICAL HISTORY: Past Medical History:  Diagnosis Date   Anemia    Arthritis    Carotid stenosis    a. s/p right carotid stent 10/2017.   Chronic systolic CHF (congestive heart failure) (HCC)    Colon polyps    COPD (chronic obstructive pulmonary disease) (HCC)    Coronary artery disease    post CABG in 3/07 , coronary stents    Depression    after major stroke years ago, no longer treated   Diabetes mellitus    type 2   Dyslipidemia    Fatty liver    Fibromyalgia    GERD (gastroesophageal reflux disease)    Headache    hx of    History of kidney stones    History of radiation therapy    right lung 11/15/2021-12/26/2021  Dr Retta Caster   Hyperlipidemia    Hypertension    Lung cancer [facility])    Myocardial infarction (HCC)    Pneumonia    hx of    Seizures (HCC)    last seizure- 03/2013    Shortness of breath dyspnea    with exertion or when fluid builds up    Sleep apnea    used to wear a cpap- not used in 3 years    Status  post dilation of esophageal narrowing    Stroke [facility]) 1993   problems with balance    Systolic murmur    known mild AS and MR   Thyroid  goiter    Tobacco abuse     MEDICATIONS: Current Outpatient Medications on File Prior to Visit  Medication Sig Dispense Refill   acetaminophen  (TYLENOL ) 500 MG tablet Take 1,000 mg by mouth at bedtime.     albuterol  (VENTOLIN  HFA) 108 (90 Base) MCG/ACT inhaler INHALE 2 PUFFS EVERY 6 HOURS AS NEEDED FOR WHEEZING/SHORTNESS OF BREATH 18 each 0   aspirin  EC 81 MG tablet Take 81 mg by mouth every evening.  30 tablet 6   BD PEN NEEDLE NANO 2ND GEN 32G X 4 MM MISC Inject into the skin 3 (three) times daily.     clopidogrel  (PLAVIX ) 75 MG tablet TAKE 1 TABLET DAILY 90 tablet 3  digoxin  (LANOXIN ) 0.125 MG tablet TAKE ONE-HALF (1/2) TABLET DAILY 45 tablet 3   ezetimibe  (ZETIA ) 10 MG tablet Take 1 tablet (10 mg total) by mouth daily. 90 tablet 3   fluticasone  (FLONASE ) 50 MCG/ACT nasal spray Place 2 sprays into both nostrils daily. 48 g 3   gabapentin  (NEURONTIN ) 300 MG capsule TAKE 2 CAPSULES DAILY AT   BEDTIME 180 capsule 3   glucose blood (ONETOUCH ULTRA) test strip 3 (three) times daily.     guaiFENesin  (MUCINEX ) 600 MG 12 hr tablet Take 2 tablets (1,200 mg total) by mouth 2 (two) times daily. 360 tablet 3   HUMULIN 70/30 KWIKPEN (70-30) 100 UNIT/ML KwikPen SMARTSIG:55 Unit(s) SUB-Q Twice Daily     icosapent  Ethyl (VASCEPA ) 1 g capsule Take 1 g by mouth 2 (two) times daily.     levETIRAcetam  (KEPPRA ) 1000 MG tablet Take 1 tablet (1,000 mg total) by mouth 2 (two) times daily. 180 tablet 3   Magnesium  400 MG TABS Take 400 mg by mouth 2 (two) times daily. 180 tablet 3   metFORMIN  (GLUCOPHAGE ) 500 MG tablet Take 500 mg by mouth 2 (two) times daily with a meal.     metolazone  (ZAROXOLYN ) 2.5 MG tablet TAKE 1 TABLET TWICE WEEKLY,EVERY MONDAY AND FRIDAY    (CHANGE IN DOSAGE).     mexiletine (MEXITIL) 150 MG capsule Take 1 capsule (150 mg total) by mouth 2 (two) times  daily. 180 capsule 3   nitroGLYCERIN  (NITROLINGUAL ) 0.4 MG/SPRAY spray Place 1 spray under the tongue every 5 (five) minutes x 3 doses as needed for chest pain. 12 g 3   nystatin  (MYCOSTATIN /NYSTOP ) powder APPLY 1 APPLICATION        TOPICALLY 4 TIMES A DAY AS NEEDED FOR YEAST INFECTIONS 60 g 5   ONE TOUCH ULTRA TEST test strip USE AS DIRECTED FOR TESTING BLOOD GLUCOSE 3 TIMES DAILY  1   pantoprazole  (PROTONIX ) 40 MG tablet Take 1 tablet (40 mg total) by mouth daily. 90 tablet 3   polyethylene glycol (MIRALAX  / GLYCOLAX ) 17 g packet Take 17 g by mouth daily.     potassium chloride  SA (KLOR-CON  M) 20 MEQ tablet Take 5 tablets (100 mEq total) by mouth daily. Take an extra 40 meq (2 tabs) twice a week with metolazone  498 tablet 3   ranolazine  (RANEXA ) 1000 MG SR tablet Take 1 tablet (1,000 mg total) by mouth 2 (two) times daily. 180 tablet 3   rosuvastatin  (CRESTOR ) 20 MG tablet Take 1 tablet (20 mg total) by mouth daily. 90 tablet 3   sodium chloride  (OCEAN) 0.65 % SOLN nasal spray Place 1 spray into both nostrils as needed for congestion.     spironolactone  (ALDACTONE ) 25 MG tablet Take 1 tablet (25 mg total) by mouth at bedtime. 90 tablet 3   torsemide  (DEMADEX ) 20 MG tablet Take 4 tablets (80 mg total) by mouth every morning AND 3 tablets (60 mg total) every evening. 540 tablet 3   triamcinolone  cream (KENALOG ) 0.5 % Apply 1 application topically 3 (three) times daily as needed (rash). To affected areas     Vericiguat  (VERQUVO ) 10 MG TABS Take 1 tablet (10 mg total) by mouth daily. 90 tablet 3   No current facility-administered medications on file prior to visit.    ALLERGIES: Allergies  Allergen Reactions   Bee Venom Itching and Swelling   Benadryl  [Diphenhydramine ] Other (See Comments)    Insomnia    Entresto  [Sacubitril -Valsartan ] Cough   Farxiga  [Dapagliflozin ]  Yeast infection   Tetracycline     Unknown, pt cannot recall exact reaction   Canagliflozin Other (See Comments)    Sitagliptin Other (See Comments)   Ace Inhibitors Cough    FAMILY HISTORY: Family History  Problem Relation Age of Onset   Heart disease Mother    Diabetes Mother    COPD Mother    Hyperlipidemia Mother    Hypertension Mother    Cancer Father        met, origin unknown   Heart attack Father    Hypertension Sister    Cancer Sister        eyelid   Glaucoma Sister    Heart disease Maternal Grandfather    Drug abuse Paternal Grandmother    Stroke Paternal Grandfather    Heart disease Maternal Aunt        x 2 aunts   Lung cancer Paternal Aunt        lung with mets to brain   Melanoma Paternal Uncle    Lung cancer Paternal Uncle        lung/liver to brain   Cancer Paternal Uncle        cancer of unknown type   Colon cancer Neg Hx    Stomach cancer Neg Hx    Esophageal cancer Neg Hx       Objective:  *** General: No acute distress.  Patient appears well-groomed.   Head:  Normocephalic/atraumatic Neck:  Supple.  No paraspinal tenderness.  Full range of motion. Heart:  Regular rate and rhythm. Neuro:  Alert and oriented.  Speech fluent and not dysarthric.  Language intact.  CN II-XII intact.  Bulk and tone normal.  Muscle strength 5/5 throughout.  Sensation to light touch intact.  Deep tendon reflexes 2+ throughout, toes downgoing.  Gait normal.  Romberg negative.    Janne Members, DO  CC: Deri Fleet, MD

## 2024-03-10 ENCOUNTER — Ambulatory Visit (INDEPENDENT_AMBULATORY_CARE_PROVIDER_SITE_OTHER): Admitting: Neurology

## 2024-03-10 ENCOUNTER — Encounter: Payer: Self-pay | Admitting: Neurology

## 2024-03-10 ENCOUNTER — Ambulatory Visit: Payer: Medicare Other | Admitting: Neurology

## 2024-03-10 VITALS — BP 125/75 | HR 60 | Ht <= 58 in | Wt 114.0 lb

## 2024-03-10 DIAGNOSIS — G40009 Localization-related (focal) (partial) idiopathic epilepsy and epileptic syndromes with seizures of localized onset, not intractable, without status epilepticus: Secondary | ICD-10-CM | POA: Diagnosis not present

## 2024-03-10 DIAGNOSIS — M5010 Cervical disc disorder with radiculopathy, unspecified cervical region: Secondary | ICD-10-CM | POA: Diagnosis not present

## 2024-03-10 MED ORDER — GABAPENTIN 300 MG PO CAPS: ORAL_CAPSULE | ORAL | 3 refills | Status: AC

## 2024-03-10 NOTE — Patient Instructions (Signed)
 Take levetiracetam  100mg  TWICE DAILY

## 2024-03-16 ENCOUNTER — Inpatient Hospital Stay

## 2024-04-14 ENCOUNTER — Inpatient Hospital Stay: Attending: Internal Medicine

## 2024-04-14 DIAGNOSIS — Z95828 Presence of other vascular implants and grafts: Secondary | ICD-10-CM

## 2024-04-14 DIAGNOSIS — C3490 Malignant neoplasm of unspecified part of unspecified bronchus or lung: Secondary | ICD-10-CM | POA: Insufficient documentation

## 2024-04-14 DIAGNOSIS — D509 Iron deficiency anemia, unspecified: Secondary | ICD-10-CM

## 2024-04-14 MED ORDER — HEPARIN SOD (PORK) LOCK FLUSH 100 UNIT/ML IV SOLN
500.0000 [IU] | Freq: Once | INTRAVENOUS | Status: AC
Start: 2024-04-14 — End: 2024-04-14
  Administered 2024-04-14: 500 [IU]

## 2024-04-14 MED ORDER — SODIUM CHLORIDE 0.9% FLUSH
10.0000 mL | Freq: Once | INTRAVENOUS | Status: AC
Start: 2024-04-14 — End: 2024-04-14
  Administered 2024-04-14: 10 mL

## 2024-04-21 ENCOUNTER — Ambulatory Visit: Payer: Medicare Other

## 2024-04-21 DIAGNOSIS — I255 Ischemic cardiomyopathy: Secondary | ICD-10-CM

## 2024-04-22 LAB — CUP PACEART REMOTE DEVICE CHECK
Battery Remaining Longevity: 132 mo
Battery Remaining Percentage: 93 %
Brady Statistic RV Percent Paced: 0 %
Date Time Interrogation Session: 20250722041200
HighPow Impedance: 85 Ohm
Implantable Lead Connection Status: 753985
Implantable Lead Implant Date: 20211015
Implantable Lead Location: 753860
Implantable Lead Model: 672
Implantable Lead Serial Number: 166771
Implantable Pulse Generator Implant Date: 20211015
Lead Channel Impedance Value: 947 Ohm
Lead Channel Setting Pacing Amplitude: 3.5 V
Lead Channel Setting Pacing Pulse Width: 0.4 ms
Lead Channel Setting Sensing Sensitivity: 0.5 mV
Pulse Gen Serial Number: 279742
Zone Setting Status: 755011

## 2024-04-23 ENCOUNTER — Ambulatory Visit: Payer: Self-pay | Admitting: Cardiology

## 2024-04-27 ENCOUNTER — Inpatient Hospital Stay

## 2024-05-07 ENCOUNTER — Ambulatory Visit

## 2024-05-07 VITALS — BP 125/75 | Ht <= 58 in | Wt 113.0 lb

## 2024-05-07 DIAGNOSIS — Z0000 Encounter for general adult medical examination without abnormal findings: Secondary | ICD-10-CM | POA: Diagnosis not present

## 2024-05-07 DIAGNOSIS — Z2821 Immunization not carried out because of patient refusal: Secondary | ICD-10-CM

## 2024-05-07 NOTE — Patient Instructions (Signed)
 Diane [name] , Thank you for taking time out of your busy schedule to complete your Annual Wellness Visit with me. I enjoyed our conversation and look forward to speaking with you again next year. I, as well as your care team,  appreciate your ongoing commitment to your health goals. Please review the following plan we discussed and let me know if I can assist you in the future. Your Game plan/ To Do List    Referrals: If you haven't heard from the office you've been referred to, please reach out to them at the phone provided.   Follow up Visits: We will see or speak with you next year for your Next Medicare AWV with our clinical staff Have you seen your provider in the last 6 months (3 months if uncontrolled diabetes)? Yes  Clinician Recommendations:  Aim for 30 minutes of exercise or brisk walking, 6-8 glasses of water, and 5 servings of fruits and vegetables each day.       This is a list of the screenings recommended for you:  Health Maintenance  Topic Date Due   Yearly kidney health urinalysis for diabetes  Never done   Zoster (Shingles) Vaccine (1 of 2) Never done   Hemoglobin A1C  08/26/2020   Flu Shot  05/01/2024   Mammogram  11/04/2024*   COVID-19 Vaccine (5 - 2024-25 season) 11/20/2024*   Eye exam for diabetics  07/21/2024   Complete foot exam   11/04/2024   Yearly kidney function blood test for diabetes  01/27/2025   Medicare Annual Wellness Visit  05/07/2025   Colon Cancer Screening  07/17/2027   Pneumococcal Vaccine for age over 25  Completed   DEXA scan (bone density measurement)  Completed   Hepatitis C Screening  Completed   Hepatitis B Vaccine  Aged Out   HPV Vaccine  Aged Out   Meningitis B Vaccine  Aged Out   DTaP/Tdap/Td vaccine  Discontinued  *Topic was postponed. The date shown is not the original due date.    Advanced directives: (Copy Requested) Please bring a copy of your health care power of attorney and living will to the office to be added to your chart  at your convenience. You can mail to [facility] [address] [place], [place] [address] or email to [email or web address] Advance Care Planning is important because it:  [x]  Makes sure you receive the medical care that is consistent with your values, goals, and preferences  [x]  It provides guidance to your family and loved ones and reduces their decisional burden about whether or not they are making the right decisions based on your wishes.  Follow the link provided in your after visit summary or read over the paperwork we have mailed to you to help you started getting your Advance Directives in place. If you need assistance in completing these, please reach out to us  so that we can help you!  See attachments for Preventive Care and Fall Prevention Tips.

## 2024-05-07 NOTE — Progress Notes (Signed)
 Because this visit was a virtual/telehealth visit,  certain criteria was not obtained, such a blood pressure, CBG if applicable, and timed get up and go. Any medications not marked as taking were not mentioned during the medication reconciliation part of the visit. Any vitals not documented were not able to be obtained due to this being a telehealth visit or patient was unable to self-report a recent blood pressure reading due to a lack of equipment at home via telehealth. Vitals that have been documented are verbally provided by the patient.  This visit was performed by a medical professional under my direct supervision. I was immediately available for consultation/collaboration. I have reviewed and agree with the Annual Wellness Visit documentation.  Subjective:   Diane [name] is a 68 y.o. who presents for a Medicare Wellness preventive visit.  As a reminder, Annual Wellness Visits don't include a physical exam, and some assessments may be limited, especially if this visit is performed virtually. We may recommend an in-person follow-up visit with your provider if needed.  Visit Complete: Virtual I connected with  Diane [name] on 05/07/24 by a audio enabled telemedicine application and verified that I am speaking with the correct person using two identifiers.  Patient Location: Home  Provider Location: Home Office  I discussed the limitations of evaluation and management by telemedicine. The patient expressed understanding and agreed to proceed.  Vital Signs: Because this visit was a virtual/telehealth visit, some criteria may be missing or patient reported. Any vitals not documented were not able to be obtained and vitals that have been documented are patient reported.  VideoDeclined- This patient declined Librarian, academic. Therefore the visit was completed with audio only.  Persons Participating in Visit: Patient.  AWV Questionnaire: No: Patient Medicare  AWV questionnaire was not completed prior to this visit.  Cardiac Risk Factors include: advanced age (>10men, >52 women);hypertension;diabetes mellitus     Objective:    Today's Vitals   05/07/24 1438  BP: 125/75  Weight: 113 lb (51.3 kg)  Height: 4' 9 (1.448 m)   Body mass index is 24.45 kg/m.     05/07/2024    2:37 PM 03/10/2024    2:14 PM 10/22/2022    2:16 PM 04/09/2022    2:54 PM 03/06/2022    3:44 PM 01/29/2022    2:35 PM 01/24/2022    3:12 PM  Advanced Directives  Does Patient Have a Medical Advance Directive? Yes Yes Yes Yes No Yes Yes  Type of Estate agent of [place];Living will Healthcare Power of [employer] of [place];Living will    Healthcare Power of [place];Living will  Does patient want to make changes to medical advance directive? No - Patient declined   No - Patient declined  No - Patient declined   Copy of Healthcare Power of Attorney in Chart? No - copy requested No - copy requested No - copy requested        Current Medications (verified) Outpatient Encounter Medications as of 05/07/2024  Medication Sig   acetaminophen  (TYLENOL ) 500 MG tablet Take 1,000 mg by mouth at bedtime.   albuterol  (VENTOLIN  HFA) 108 (90 Base) MCG/ACT inhaler INHALE 2 PUFFS EVERY 6 HOURS AS NEEDED FOR WHEEZING/SHORTNESS OF BREATH   aspirin  EC 81 MG tablet Take 81 mg by mouth every evening.    BD PEN NEEDLE NANO 2ND GEN 32G X 4 MM MISC Inject into the skin 3 (three) times daily.   clopidogrel  (PLAVIX ) 75 MG tablet  TAKE 1 TABLET DAILY   digoxin  (LANOXIN ) 0.125 MG tablet TAKE ONE-HALF (1/2) TABLET DAILY   ezetimibe  (ZETIA ) 10 MG tablet Take 1 tablet (10 mg total) by mouth daily.   fluticasone  (FLONASE ) 50 MCG/ACT nasal spray Place 2 sprays into both nostrils daily.   gabapentin  (NEURONTIN ) 300 MG capsule TAKE 2 CAPSULES DAILY AT   BEDTIME   glucose blood (ONETOUCH ULTRA) test strip 3 (three) times daily.   guaiFENesin  (MUCINEX ) 600 MG 12 hr tablet Take  2 tablets (1,200 mg total) by mouth 2 (two) times daily.   HUMULIN 70/30 KWIKPEN (70-30) 100 UNIT/ML KwikPen SMARTSIG:55 Unit(s) SUB-Q Twice Daily   icosapent  Ethyl (VASCEPA ) 1 g capsule Take 1 g by mouth 2 (two) times daily.   levETIRAcetam  (KEPPRA ) 1000 MG tablet Take 1 tablet (1,000 mg total) by mouth 2 (two) times daily.   Magnesium  400 MG TABS Take 400 mg by mouth 2 (two) times daily.   metFORMIN  (GLUCOPHAGE ) 500 MG tablet Take 500 mg by mouth 2 (two) times daily with a meal.   metolazone  (ZAROXOLYN ) 2.5 MG tablet TAKE 1 TABLET TWICE WEEKLY,EVERY MONDAY AND FRIDAY    (CHANGE IN DOSAGE).   mexiletine (MEXITIL) 150 MG capsule Take 1 capsule (150 mg total) by mouth 2 (two) times daily.   nitroGLYCERIN  (NITROLINGUAL ) 0.4 MG/SPRAY spray Place 1 spray under the tongue every 5 (five) minutes x 3 doses as needed for chest pain.   nystatin  (MYCOSTATIN /NYSTOP ) powder APPLY 1 APPLICATION        TOPICALLY 4 TIMES A DAY AS NEEDED FOR YEAST INFECTIONS   ONE TOUCH ULTRA TEST test strip USE AS DIRECTED FOR TESTING BLOOD GLUCOSE 3 TIMES DAILY   pantoprazole  (PROTONIX ) 40 MG tablet Take 1 tablet (40 mg total) by mouth daily.   polyethylene glycol (MIRALAX  / GLYCOLAX ) 17 g packet Take 17 g by mouth daily.   potassium chloride  SA (KLOR-CON  M) 20 MEQ tablet Take 5 tablets (100 mEq total) by mouth daily. Take an extra 40 meq (2 tabs) twice a week with metolazone    ranolazine  (RANEXA ) 1000 MG SR tablet Take 1 tablet (1,000 mg total) by mouth 2 (two) times daily.   rosuvastatin  (CRESTOR ) 20 MG tablet Take 1 tablet (20 mg total) by mouth daily.   sodium chloride  (OCEAN) 0.65 % SOLN nasal spray Place 1 spray into both nostrils as needed for congestion.   spironolactone  (ALDACTONE ) 25 MG tablet TAKE 1 TABLET AT BEDTIME   torsemide  (DEMADEX ) 20 MG tablet Take 4 tablets (80 mg total) by mouth every morning AND 3 tablets (60 mg total) every evening.   triamcinolone  cream (KENALOG ) 0.5 % Apply 1 application topically 3  (three) times daily as needed (rash). To affected areas   Vericiguat  (VERQUVO ) 10 MG TABS Take 1 tablet (10 mg total) by mouth daily.   No facility-administered encounter medications on file as of 05/07/2024.    Allergies (verified) Bee venom, Benadryl  [diphenhydramine ], Entresto  [sacubitril -valsartan ], Farxiga  [dapagliflozin ], Tetracycline, Canagliflozin, Sitagliptin, and Ace inhibitors   History: Past Medical History:  Diagnosis Date   Anemia    Arthritis    Carotid stenosis    a. s/p right carotid stent 10/2017.   Chronic systolic CHF (congestive heart failure) (HCC)    Colon polyps    COPD (chronic obstructive pulmonary disease) (HCC)    Coronary artery disease    post CABG in 3/07 , coronary stents    Depression    after major stroke years ago, no longer treated   Diabetes mellitus  type 2   Dyslipidemia    Fatty liver    Fibromyalgia    GERD (gastroesophageal reflux disease)    Headache    hx of    History of kidney stones    History of radiation therapy    right lung 11/15/2021-12/26/2021  Dr Lynwood Nasuti   Hyperlipidemia    Hypertension    Lung cancer [facility])    Myocardial infarction (HCC)    Pneumonia    hx of    Seizures (HCC)    last seizure- 03/2013    Shortness of breath dyspnea    with exertion or when fluid builds up    Sleep apnea    used to wear a cpap- not used in 3 years    Status post dilation of esophageal narrowing    Stroke [facility]) 1993   problems with balance    Systolic murmur    known mild AS and MR   Thyroid  goiter    Tobacco abuse    Past Surgical History:  Procedure Laterality Date   ABDOMINAL AORTOGRAM W/LOWER EXTREMITY N/A 04/18/2020   Procedure: ABDOMINAL AORTOGRAM W/LOWER EXTREMITY;  Surgeon: Court Dorn PARAS, MD;  [facility] INVAS[facility] CV LAB;  Service: Cardiovascular;  Laterality: N/A;   APPENDECTOMY     BIOPSY  02/03/2019   Procedure: BIOPSY;  Surgeon: Aneita Gwendlyn DASEN, MD;  Location: [facility] ENDOSCOPY;  Service: Endoscopy;;   BREAST  BIOPSY Left 2016   BRONCHIAL BIOPSY  09/18/2021   Procedure: BRONCHIAL BIOPSIES;  Surgeon: Shelah Lamar RAMAN, MD;  Location: [facility] ENDOSCOPY;  Service: Pulmonary;;   BRONCHIAL BRUSHINGS  09/18/2021   Procedure: BRONCHIAL BRUSHINGS;  Surgeon: Shelah Lamar RAMAN, MD;  Location: [facility] ENDOSCOPY;  Service: Pulmonary;;   BRONCHIAL NEEDLE ASPIRATION BIOPSY  09/18/2021   Procedure: BRONCHIAL NEEDLE ASPIRATION BIOPSIES;  Surgeon: Shelah Lamar RAMAN, MD;  Location: [facility] ENDOSCOPY;  Service: Pulmonary;;   CARDIAC CATHETERIZATION  11/29/05   EF of 55%   CARDIAC CATHETERIZATION  08/06/06   EF of 45-50%   CARDIAC CATHETERIZATION N/A 05/06/2015   Procedure: Right/Left Heart Cath and Coronary/Graft Angiography;  Surgeon: Peter M Swaz[place], MD;  [facility];  Service: Cardiovascular;  Laterality: N/A;   CAROTID PTA/STENT INTERVENTION N/A 10/10/2017   Procedure: CAROTID PTA/STENT INTERVENTION - Right;  Surgeon: Serene Gaile ORN, MD;  [facility];  Service: Cardiovascular;  Laterality: N/A;   CERVICAL FUSION  1990   CHOLECYSTECTOMY     COLON RESECTION     mass removed and 4 in of colon   COLONOSCOPY WITH PROPOFOL  N/A 07/16/2017   Procedure: COLONOSCOPY WITH PROPOFOL ;  Surgeon: Aneita Gwendlyn DASEN, MD;  Location: [facility] ENDOS[facility];  Service: Endoscopy;  Laterality: N/A;   CORONARY ARTERY BYPASS GRAFT  12/04/2005   x5 -- left internal mammary artery to the LAD, left radial artery to the ramus intermedius, saphenous vein graft to the obtuse marginal 1, sequential saphenous vein grat to the acute marginal and posterior descending, endoscopic vein harvesting from the left thigh with open vein harvest from right leg   CORONARY STENT PLACEMENT  08/11/06   PCI of her ciurcumflex/OM vessel   ESOPHAGOGASTRODUODENOSCOPY (EGD) WITH PROPOFOL  N/A 02/03/2019   Procedure: ESOPHAGOGASTRODUODENOSCOPY (EGD) WITH PROPOFOL ;  Surgeon: Aneita Gwendlyn DASEN, MD;  Location[facility] ENDOS[facility];  Service: Endoscopy;  Laterality: N/A;   FINE  NEEDLE ASPIRATION  09/18/2021   Procedure: FINE NEEDLE ASPIRATION (FNA) LINEAR;  Surgeon: Shelah Lamar RAMAN, MD;  Location: [facility] ENDOS[facility];  Service: Pulmonary;;  ICD IMPLANT N/A 07/15/2020   Procedure: ICD IMPLANT;  Surgeon: Cindie Ole DASEN, MD;  Location: [facility] INVASIVE CV LAB;  Service: Cardiovascular;  Laterality: N/A;   IR IMAGING GUIDED PORT INSERTION  10/20/2021   LAPAROTOMY Bilateral 05/19/2015   Procedure: EXPLORATORY LAPAROTOMY WITH BILATERAL SALPINGO OOPHORECTOMY /OMENTECTOMY/SEGMENTAL SIGMOID COLECTOMY ;  Surgeon: Maurilio Ship, MD;  Location: [facility] ORS;  Service: Gynecology;  Laterality: Bilateral;   PERIPHERAL VASCULAR BALLOON ANGIOPLASTY  04/18/2020   Procedure: PERIPHERAL VASCULAR BALLOON ANGIOPLASTY;  Surgeon: Court Dorn PARAS, MD;  [facility];  Service: Cardiovascular;;  rt SFA  atherectomy and DCB   PERIPHERAL VASCULAR CATHETERIZATION N/A 11/15/2015   Procedure: Carotid PTA/Stent Intervention;  Surgeon: Dorn PARAS Court, MD;  [facility];  Service: Cardiovascular;  Laterality: N/A;   PERIPHERAL VASCULAR CATHETERIZATION  11/15/2015   Procedure: Carotid Angiography;  Surgeon: Dorn PARAS Court, MD;  Location: [facility] INVASIVE CV LAB;  Service: Cardiovascular;;   RIGHT HEART CATH N/A 01/27/2020   Procedure: RIGHT HEART CATH;  Surgeon: Rolan Ezra RAMAN, MD;  Location: [facility] INVASIVE CV LAB;  Service: Cardiovascular;  Laterality: N/A;   VIDEO BRONCHOSCOPY WITH ENDOBRONCHIAL ULTRASOUND N/A 09/18/2021   Procedure: VIDEO BRONCHOSCOPY WITH ENDOBRONCHIAL ULTRASOUND;  Surgeon: Shelah Lamar RAMAN, MD;  [facility] ENDOS[facility];  Service: Pulmonary;  Laterality: N/A;   VIDEO BRONCHOSCOPY WITH RADIAL ENDOBRONCHIAL ULTRASOUND  09/18/2021   Procedure: RADIAL ENDOBRONCHIAL ULTRASOUND;  Surgeon: Shelah Lamar RAMAN, MD;  Location[facility] ENDOSCOPY;  Service: Pulmonary;;   Family History  Problem Relation Age of Onset   Heart disease Mother    Diabetes Mother    COPD Mother    Hyperlipidemia  Mother    Hypertension Mother    Cancer Father        met, origin unknown   Heart attack Father    Hypertension Sister    Cancer Sister        eyelid   Glaucoma Sister    Heart disease Maternal Grandfather    Drug abuse Paternal Grandmother    Stroke Paternal Grandfather    Heart disease Maternal Aunt        x 2 aunts   Lung cancer Paternal Aunt        lung with mets to brain   Melanoma Paternal Uncle    Lung cancer Paternal Uncle        lung/liver to brain   Cancer Paternal Uncle        cancer of unknown type   Colon cancer Neg Hx    Stomach cancer Neg Hx    Esophageal cancer Neg Hx    Social History   Socioeconomic History   Marital status: Divorced    Spouse name: Not on file   Number of children: 0   Years of education: Not on file   Highest education level: Not on file  Occupational History   Occupation: disabled  Tobacco Use   Smoking status: Every Day    Current packs/day: 0.00    Average packs/day: 0.5 packs/day for 54.4 years (27.2 ttl pk-yrs)    Types: Cigarettes    Start date: 53    Last attempt to quit: 02/22/2020    Years since quitting: 4.2   Smokeless tobacco: Never   Tobacco comments:    Pt smokes a 1 pack a day 08/30/2021 / pl  Vaping Use   Vaping status: Former  Substance and Sexual Activity   Alcohol use: No   Drug use: No  Sexual activity: Not Currently    Partners: Male    Birth control/protection: None  Other Topics Concern   Not on file  Social History Narrative   Right handed   Lives alone in a one story home   Social Drivers of Health   Financial Resource Strain: Low Risk  (05/07/2024)   Overall Financial Resource Strain (CARDIA)    Difficulty of Paying Living Expenses: Not very hard  Food Insecurity: No Food Insecurity (05/07/2024)   Hunger Vital Sign    Worried About Running Out of Food in the Last Year: Never true    Ran Out of Food in the Last Year: Never true  Transportation Needs: No Transportation Needs (05/07/2024)    PRAPARE - Administrator, Civil Service (Medical): No    Lack of Transportation (Non-Medical): No  Physical Activity: Patient Declined (05/07/2024)   Exercise Vital Sign    Days of Exercise per Week: Patient declined    Minutes of Exercise per Session: Patient declined  Stress: No Stress Concern Present (05/07/2024)   [employer] of Occupational Health - Occupational Stress Questionnaire    Feeling of Stress: Not at all  Social Connections: Socially Isolated (05/07/2024)   Social Connection and Isolation Panel    Frequency of Communication with Friends and Family: More than three times a week    Frequency of Social Gatherings with Friends and Family: Twice a week    Attends Religious Services: Never    Database administrator or Organizations: No    Attends Engineer, structural: Never    Marital Status: Divorced    Tobacco Counseling Ready to quit: Not Answered Counseling given: Not Answered Tobacco comments: Pt smokes a 1 pack a day 08/30/2021 / pl    Clinical Intake:  Pre-visit preparation completed: Yes  Pain : No/denies pain     BMI - recorded: 24.45 Nutritional Status: BMI of 19-24  Normal Nutritional Risks: None Diabetes: Yes CBG done?: No Did pt. bring in CBG monitor from home?: No  Lab Results  Component Value Date   HGBA1C 8.6 (H) 02/24/2020   HGBA1C 6.7 11/03/2018   HGBA1C 8.0 (H) 06/09/2014     How often do you need to have someone help you when you read instructions, pamphlets, or other written materials from your doctor or pharmacy?: 1 - Never What is the last grade level you completed in school?: Some college  Interpreter Needed?: No  Information entered by :: Rayvon Dakin,CMA   Activities of Daily Living     05/07/2024    2:42 PM  In your present state of health, do you have any difficulty performing the following activities:  Hearing? 0  Vision? 0  Difficulty concentrating or making decisions? 0  Walking or climbing  stairs? 0  Dressing or bathing? 1  Doing errands, shopping? 0  Preparing Food and eating ? N  Using the Toilet? N  In the past six months, have you accidently leaked urine? N  Do you have problems with loss of bowel control? N  Managing your Medications? N  Managing your Finances? N  Housekeeping or managing your Housekeeping? Y    Patient Care Team: Tower, Laine LABOR, MD as PCP - General [place], Peter M, MD as PCP - Cardiology (Cardiology) Cindie Ole DASEN, MD as PCP - Electrophysiology (Cardiology) Rubin Calamity, MD as Consulting Physician (General Surgery) Eloy Herring, MD as Consulting Physician (Obstetrics and Gynecology) Shellia Oh, MD (Inactive) as Consulting Physician (Pulmonary Disease) [place]on,  Shanda SQUIBB, MD (Inactive) as Consulting Physician (Obstetrics and Gynecology) Tommas Pears, MD as Consulting Physician (Endocrinology) Cleotilde Sewer, OD (Optometry) [place], Peter M, MD as Consulting Physician (Cardiology) Court Dorn PARAS, MD as Consulting Physician (Cardiology) Dolphus Reiter, MD as Consulting Physician (Rheumatology) Myra Rosaline FALCON, [facility] (Inactive) as Pharmacist (Pharmacist)  I have updated your Care Teams any recent Medical Services you may have received from other providers in the past year.     Assessment:   This is a routine wellness examination for Diane [name].  Hearing/Vision screen Hearing Screening - Comments:: No difficulties. Patient had 2 cataract surgeries Vision Screening - Comments:: Patient wears glasses    Goals Addressed             This Visit's Progress    Patient Stated   On track    Stay healthy as I can        Depression Screen     05/07/2024    2:45 PM 12/31/2022   11:58 AM 10/22/2022    2:17 PM 10/03/2022    4:31 PM 06/26/2021    2:07 PM 09/13/2020    3:03 PM 06/07/2020   12:25 PM  PHQ 2/9 Scores  PHQ - 2 Score 0 0 0 0 0 0 0  PHQ- 9 Score 1  0 5  2     Fall Risk     05/07/2024    2:41 PM 03/10/2024    2:13 PM  10/22/2022    2:16 PM 03/06/2022    3:44 PM 06/26/2021    2:07 PM  Fall Risk   Falls in the past year? 1 0 1 0 1  Number falls in past yr: 0 0 1 0 0  Injury with Fall? 0 0 1 0 1  Comment   head and broke ribs, ankle related to legs giving out    Risk for fall due to : History of fall(s);Impaired balance/gait;Impaired mobility  Impaired balance/gait;History of fall(s);Impaired mobility;Impaired vision    Follow up Falls evaluation completed Falls evaluation completed Falls prevention discussed   Falls evaluation completed      Data saved with a previous flowsheet row definition    MEDICARE RISK AT HOME:  Medicare Risk at Home Any stairs in or around the home?: Yes If so, are there any without handrails?: No Home free of loose throw rugs in walkways, pet beds, electrical cords, etc?: Yes Adequate lighting in your home to reduce risk of falls?: Yes Life alert?: No Use of a cane, walker or w/c?: Yes (walker) Grab bars in the bathroom?: Yes Shower chair or bench in shower?: Yes Elevated toilet seat or a handicapped toilet?: No  TIMED UP AND GO:  Was the test performed?  No  Cognitive Function: 6CIT completed    04/20/2019    2:26 PM 04/14/2018    1:02 PM 04/11/2017    2:03 PM  MMSE - Mini Mental State Exam  Orientation to time 5 5 5    Orientation to Place 5 5 5    Registration 3 3 3    Attention/ Calculation 5 0 0   Recall 3 3 3    Language- name 2 objects 0 0 0   Language- repeat 1 1 1   Language- follow 3 step command 0 3 3   Language- read & follow direction 0 0 0   Write a sentence 0 0 0   Copy design 0 0 0   Total score 22 20 20       Data saved with a  previous flowsheet row definition        05/07/2024    2:46 PM 10/22/2022    2:21 PM  6CIT Screen  What Year? 0 points 0 points  What month? 0 points 0 points  What time? 0 points 0 points  Count back from 20 0 points 0 points  Months in reverse 0 points 0 points  Repeat phrase 0 points 0 points  Total Score 0 points 0  points    Immunizations Immunization History  Administered Date(s) Administered   Fluad Quad(high Dose 65+) 06/23/2019, 06/07/2020, 06/26/2021, 10/03/2022   Fluad Trivalent(High Dose 65+) 11/05/2023   Influenza Whole 06/01/2012   Influenza,inj,Quad PF,6+ Mos 06/16/2013, 06/16/2014, 06/21/2015, 08/13/2016, 10/11/2017, 07/15/2018   Moderna Sars-Covid-2 Vaccination 12/31/2019, 01/30/2020   PNEUMOCOCCAL CONJUGATE-20 11/05/2023   Pneumococcal Conjugate-13 05/11/2016   Pneumococcal Polysaccharide-23 11/18/1998, 11/04/2008, 06/16/2014, 06/23/2019   Td 10/01/1996, 11/15/2009    Screening Tests Health Maintenance  Topic Date Due   Diabetic kidney evaluation - Urine ACR  Never done   Zoster Vaccines- Shingrix (1 of 2) Never done   HEMOGLOBIN A1C  08/26/2020   INFLUENZA VACCINE  05/01/2024   MAMMOGRAM  11/04/2024 (Originally 10/25/2021)   COVID-19 Vaccine (5 - 2024-25 season) 11/20/2024 (Originally 06/02/2023)   OPHTHALMOLOGY EXAM  07/21/2024   FOOT EXAM  11/04/2024   Diabetic kidney evaluation - eGFR measurement  01/27/2025   Medicare Annual Wellness (AWV)  05/07/2025   Colonoscopy  07/17/2027   Pneumococcal Vaccine: 50+ Years  Completed   DEXA SCAN  Completed   Hepatitis C Screening  Completed   Hepatitis B Vaccines  Aged Out   HPV VACCINES  Aged Out   Meningococcal B Vaccine  Aged Out   DTaP/Tdap/Td  Discontinued    Health Maintenance  Health Maintenance Due  Topic Date Due   Diabetic kidney evaluation - Urine ACR  Never done   Zoster Vaccines- Shingrix (1 of 2) Never done   HEMOGLOBIN A1C  08/26/2020   INFLUENZA VACCINE  05/01/2024   Health Maintenance Items Addressed:   Additional Screening:  Vision Screening: Recommended annual ophthalmology exams for early detection of glaucoma and other disorders of the eye. Would you like a referral to an eye doctor? No    Dental Screening: Recommended annual dental exams for proper oral hygiene  Community Resource Referral /  Chronic Care Management: CRR required this visit?  No   CCM required this visit?  No   Plan:    I have personally reviewed and noted the following in the patient's chart:   Medical and social history Use of alcohol, tobacco or illicit drugs  Current medications and supplements including opioid prescriptions. Patient is not currently taking opioid prescriptions. Functional ability and status Nutritional status Physical activity Advanced directives List of other physicians Hospitalizations, surgeries, and ER visits in previous 12 months Vitals Screenings to include cognitive, depression, and falls Referrals and appointments  In addition, I have reviewed and discussed with patient certain preventive protocols, quality metrics, and best practice recommendations. A written personalized care plan for preventive services as well as general preventive health recommendations were provided to patient.   Lyle MARLA Right, [place]   05/07/2024   After Visit Summary: (MyChart) Due to this being a telephonic visit, the after visit summary with patients personalized plan was offered to patient via MyChart   Notes: Nothing significant to report at this time.

## 2024-05-08 ENCOUNTER — Telehealth: Payer: Self-pay | Admitting: Family Medicine

## 2024-05-08 DIAGNOSIS — C3490 Malignant neoplasm of unspecified part of unspecified bronchus or lung: Secondary | ICD-10-CM

## 2024-05-08 DIAGNOSIS — E1142 Type 2 diabetes mellitus with diabetic polyneuropathy: Secondary | ICD-10-CM

## 2024-05-08 DIAGNOSIS — Z8673 Personal history of transient ischemic attack (TIA), and cerebral infarction without residual deficits: Secondary | ICD-10-CM

## 2024-05-08 DIAGNOSIS — Z794 Long term (current) use of insulin: Secondary | ICD-10-CM

## 2024-05-08 DIAGNOSIS — C3491 Malignant neoplasm of unspecified part of right bronchus or lung: Secondary | ICD-10-CM

## 2024-05-08 DIAGNOSIS — I5022 Chronic systolic (congestive) heart failure: Secondary | ICD-10-CM

## 2024-05-08 NOTE — Telephone Encounter (Signed)
 Copied from CRM [id number]. Topic: General - Other >> May 07, 2024  3:19 PM Rosina BIRCH wrote: Reason for CRM: patient called wanting to know if her insurance would cover in home health care and if so she would like a referral CB [phone]

## 2024-05-08 NOTE — Telephone Encounter (Signed)
 Depends on what she needs it for  (nursing or PT/ OT)     personal care is usually not covered  We have to have a face to face visit and document that for insurance coverage  Schedule appointment if needed  If the is some question about it I can refer to community care/social work as well (if not already plugged into the system)

## 2024-05-12 NOTE — Telephone Encounter (Signed)
 Pt doesn't have any PT/OT needs she does want a personal care person to help with bathing/ dressing and ADL, I did advise pt that we can't put order in for that due to insurance not covering it. Pt does want to proceed with social work eval to see if there is any community help that can assist her.

## 2024-05-12 NOTE — Telephone Encounter (Signed)
 Of note- I think I did a  similar referral in February for another reason and they were not able to reach her  How would she like to be notified?  Does she use mychart?  Please verify contact info  Let me know and I will put referral in

## 2024-05-13 NOTE — Telephone Encounter (Signed)
 Pt only has one # on file, she is aware they will be calling and it may not be our [place] phone # but if it says [place] at all she will answer it and if she doesn't get to phone I advise her to keep a check on her voicemail. Pt doesn't have mychart

## 2024-05-13 NOTE — Telephone Encounter (Signed)
 I put the referral in for  Please let us  know if you don't hear in 1-2 weeks to set that up (mychart message or call or letter)

## 2024-05-13 NOTE — Addendum Note (Signed)
 Addended by: RANDEEN HARDY A on: 05/13/2024 08:22 PM   Modules accepted: Orders

## 2024-05-15 ENCOUNTER — Telehealth: Payer: Self-pay

## 2024-05-15 NOTE — Progress Notes (Signed)
 Complex Care Management Note Care Guide Note  05/15/2024 Name: BRILYN TULLER MRN: [id number] DOB: 1956-02-11   Complex Care Management Outreach Attempts: An unsuccessful telephone outreach was attempted today to offer the patient information about available complex care management services.  Follow Up Plan:  Additional outreach attempts will be made to offer the patient complex care management information and services.   Encounter Outcome:  No Answer  Leotis Rase [facility], [facility] Guide  Direct Dial: [phone]  Fax [phone]

## 2024-05-18 NOTE — Progress Notes (Signed)
 Complex Care Management Note Care Guide Note  05/18/2024 Name: Diane [name] MRN: [id number] DOB: 1955-11-07   Complex Care Management Outreach Attempts: A second unsuccessful outreach was attempted today to offer the patient with information about available complex care management services.  Follow Up Plan:  Additional outreach attempts will be made to offer the patient complex care management information and services.   Encounter Outcome:  No Answer  Leotis Rase [facility], [facility] Guide  Direct Dial: [phone]  Fax [phone]

## 2024-05-19 NOTE — Progress Notes (Signed)
 Complex Care Management Note Care Guide Note  05/19/2024 Name: Diane [name] MRN: [id number] DOB: Aug 28, 1956   Complex Care Management Outreach Attempts: A third unsuccessful outreach was attempted today to offer the patient with information about available complex care management services.  Follow Up Plan:  No further outreach attempts will be made at this time. We have been unable to contact the patient to offer or enroll patient in complex care management services.  Encounter Outcome:  No Answer  Leotis Rase [facility], [facility] Guide  Direct Dial: [phone]  Fax [phone]

## 2024-06-05 ENCOUNTER — Telehealth: Payer: Self-pay | Admitting: Internal Medicine

## 2024-06-08 ENCOUNTER — Inpatient Hospital Stay: Attending: Internal Medicine

## 2024-06-08 ENCOUNTER — Inpatient Hospital Stay

## 2024-06-08 VITALS — BP 138/84 | HR 94 | Temp 98.7°F | Resp 14

## 2024-06-08 DIAGNOSIS — Z95828 Presence of other vascular implants and grafts: Secondary | ICD-10-CM

## 2024-06-08 DIAGNOSIS — D509 Iron deficiency anemia, unspecified: Secondary | ICD-10-CM

## 2024-06-08 MED ORDER — SODIUM CHLORIDE 0.9% FLUSH
10.0000 mL | Freq: Once | INTRAVENOUS | Status: AC
  Administered 2024-06-08: 10 mL

## 2024-06-10 ENCOUNTER — Other Ambulatory Visit (HOSPITAL_COMMUNITY): Payer: Self-pay | Admitting: Cardiology

## 2024-06-10 ENCOUNTER — Ambulatory Visit (HOSPITAL_COMMUNITY)
Admission: RE | Admit: 2024-06-10 | Discharge: 2024-06-10 | Disposition: A | Discharge status: Home / Self Care | Source: Ambulatory Visit | Attending: Cardiology | Admitting: Cardiology

## 2024-06-10 ENCOUNTER — Encounter (HOSPITAL_COMMUNITY): Payer: Self-pay | Admitting: Cardiology

## 2024-06-10 VITALS — BP 130/70 | HR 93 | Wt 116.0 lb

## 2024-06-10 DIAGNOSIS — I083 Combined rheumatic disorders of mitral, aortic and tricuspid valves: Secondary | ICD-10-CM | POA: Insufficient documentation

## 2024-06-10 DIAGNOSIS — Z4502 Encounter for adjustment and management of automatic implantable cardiac defibrillator: Secondary | ICD-10-CM | POA: Insufficient documentation

## 2024-06-10 DIAGNOSIS — Z794 Long term (current) use of insulin: Secondary | ICD-10-CM | POA: Diagnosis not present

## 2024-06-10 DIAGNOSIS — Z7982 Long term (current) use of aspirin: Secondary | ICD-10-CM | POA: Diagnosis not present

## 2024-06-10 DIAGNOSIS — Z7984 Long term (current) use of oral hypoglycemic drugs: Secondary | ICD-10-CM | POA: Diagnosis not present

## 2024-06-10 DIAGNOSIS — M79604 Pain in right leg: Secondary | ICD-10-CM | POA: Insufficient documentation

## 2024-06-10 DIAGNOSIS — I110 Hypertensive heart disease with heart failure: Secondary | ICD-10-CM | POA: Insufficient documentation

## 2024-06-10 DIAGNOSIS — I739 Peripheral vascular disease, unspecified: Secondary | ICD-10-CM | POA: Diagnosis not present

## 2024-06-10 DIAGNOSIS — Z95810 Presence of automatic (implantable) cardiac defibrillator: Secondary | ICD-10-CM | POA: Diagnosis not present

## 2024-06-10 DIAGNOSIS — M79605 Pain in left leg: Secondary | ICD-10-CM | POA: Insufficient documentation

## 2024-06-10 DIAGNOSIS — D509 Iron deficiency anemia, unspecified: Secondary | ICD-10-CM | POA: Diagnosis not present

## 2024-06-10 DIAGNOSIS — I255 Ischemic cardiomyopathy: Secondary | ICD-10-CM | POA: Insufficient documentation

## 2024-06-10 DIAGNOSIS — E119 Type 2 diabetes mellitus without complications: Secondary | ICD-10-CM | POA: Insufficient documentation

## 2024-06-10 DIAGNOSIS — F17210 Nicotine dependence, cigarettes, uncomplicated: Secondary | ICD-10-CM | POA: Insufficient documentation

## 2024-06-10 DIAGNOSIS — I2510 Atherosclerotic heart disease of native coronary artery without angina pectoris: Secondary | ICD-10-CM | POA: Diagnosis not present

## 2024-06-10 DIAGNOSIS — Z923 Personal history of irradiation: Secondary | ICD-10-CM | POA: Insufficient documentation

## 2024-06-10 DIAGNOSIS — Z8673 Personal history of transient ischemic attack (TIA), and cerebral infarction without residual deficits: Secondary | ICD-10-CM | POA: Diagnosis not present

## 2024-06-10 DIAGNOSIS — Z79899 Other long term (current) drug therapy: Secondary | ICD-10-CM | POA: Diagnosis not present

## 2024-06-10 DIAGNOSIS — Z9221 Personal history of antineoplastic chemotherapy: Secondary | ICD-10-CM | POA: Insufficient documentation

## 2024-06-10 DIAGNOSIS — I5022 Chronic systolic (congestive) heart failure: Secondary | ICD-10-CM | POA: Insufficient documentation

## 2024-06-10 DIAGNOSIS — Z7902 Long term (current) use of antithrombotics/antiplatelets: Secondary | ICD-10-CM | POA: Diagnosis not present

## 2024-06-10 DIAGNOSIS — Z951 Presence of aortocoronary bypass graft: Secondary | ICD-10-CM | POA: Diagnosis not present

## 2024-06-10 DIAGNOSIS — Z85118 Personal history of other malignant neoplasm of bronchus and lung: Secondary | ICD-10-CM | POA: Insufficient documentation

## 2024-06-10 DIAGNOSIS — I6522 Occlusion and stenosis of left carotid artery: Secondary | ICD-10-CM | POA: Diagnosis not present

## 2024-06-10 LAB — BASIC METABOLIC PANEL WITH GFR
Anion gap: 12 (ref 5–15)
BUN: 23 mg/dL (ref 8–23)
CO2: 28 mmol/L (ref 22–32)
Calcium: 10.1 mg/dL (ref 8.9–10.3)
Chloride: 92 mmol/L — ABNORMAL LOW (ref 98–111)
Creatinine, Ser: 1.1 mg/dL — ABNORMAL HIGH (ref 0.44–1.00)
GFR, Estimated: 54 mL/min — ABNORMAL LOW (ref >60)
Glucose, Bld: 157 mg/dL — ABNORMAL HIGH (ref 70–99)
Potassium: 4.6 mmol/L (ref 3.5–5.1)
Sodium: 132 mmol/L — ABNORMAL LOW (ref 135–145)

## 2024-06-10 LAB — DIGOXIN LEVEL: Digoxin Level: 0.6 ng/mL — ABNORMAL LOW (ref 0.8–2.0)

## 2024-06-10 MED ORDER — NITROGLYCERIN 0.4 MG/SPRAY TL SOLN: 1.0000 | 3 refills | Status: DC | PRN

## 2024-06-10 MED ORDER — BISOPROLOL FUMARATE 5 MG PO TABS: 2.5000 mg | ORAL_TABLET | Freq: Every day | ORAL | 11 refills | Status: DC

## 2024-06-10 NOTE — Patient Instructions (Signed)
 START Bisoprolol  2.5 mg daily.  Labs done today, your results will be available in MyChart, we will contact you for abnormal readings.  Your physician has requested that you have an echocardiogram. Echocardiography is a painless test that uses sound waves to create images of your heart. It provides your doctor with information about the size and shape of your heart and how well your heart's chambers and valves are working. This procedure takes approximately one hour. There are no restrictions for this procedure. Please do NOT wear cologne, perfume, aftershave, or lotions (deodorant is allowed). Please arrive 15 minutes prior to your appointment time.  Please note: We ask at that you not bring children with you during ultrasound (echo/ vascular) testing. Due to room size and safety concerns, children are not allowed in the ultrasound rooms during exams. Our front office staff cannot provide observation of children in our lobby area while testing is being conducted. An adult accompanying a patient to their appointment will only be allowed in the ultrasound room at the discretion of the ultrasound technician under special circumstances. We apologize for any inconvenience.  Your provider has ordered carotid dopplers.  Your physician recommends that you schedule a follow-up appointment in: 3 months  If you have any questions or concerns before your next appointment please send us  a message through [place] or call our office at [phone].    TO LEAVE A MESSAGE FOR THE NURSE SELECT OPTION 2, PLEASE LEAVE A MESSAGE INCLUDING: YOUR NAME DATE OF BIRTH CALL BACK NUMBER REASON FOR CALL**this is important as we prioritize the call backs  YOU WILL RECEIVE A CALL BACK THE SAME DAY AS LONG AS YOU CALL BEFORE 4:00 PM  At the [facility], you and your health needs are our priority. As part of our continuing mission to provide you with exceptional heart care, we have created designated  Provider Care Teams. These Care Teams include your primary Cardiologist (physician) and Advanced Practice Providers (APPs- Physician Assistants and Nurse Practitioners) who all work together to provide you with the care you need, when you need it.   You may see any of the following providers on your designated Care Team at your next follow up: Dr Toribio Fuel Dr Ezra Shuck Dr. Ria Commander Dr. Morene Brownie Amy Lenetta, NP Caffie Shed, [place] [facility] [place], [place] Beckey Coe, NP [place] Lee, NP Ellouise Class, NP Tinnie Redman, PharmD Jaun Bash, PharmD   Please be sure to bring in all your medications bottles to every appointment.    Thank you for choosing [facility]

## 2024-06-11 ENCOUNTER — Other Ambulatory Visit (HOSPITAL_COMMUNITY): Payer: Self-pay

## 2024-06-11 ENCOUNTER — Ambulatory Visit (HOSPITAL_COMMUNITY): Payer: Self-pay | Admitting: Cardiology

## 2024-06-11 ENCOUNTER — Encounter: Payer: Self-pay | Admitting: Physician Assistant

## 2024-06-11 NOTE — Progress Notes (Signed)
 PCP: Randeen Laine LABOR, MD Cardiology: Dr. [place] HF Cardiology: Dr. Rolan  Chief complaint: CHF  68 y.o. with history of CAD, ischemic cardiomyopathy, CVA, and valvular disease was referred by Dr. [place] for CHF evaluation.  Patient has an extensive history of vascular disease.  She had CABG in 2007.  Repeat LHC in 2007 showed all grafts closed except LIMA-LAD.  Patient had DES to LCx/OM.  LHC 2016 showed occlusion of DES to LCx/OM with L-L and L-R collaterals. Poor targets for redo CABG.   In 2/21, echo was done showing EF 20-25% with moderate RV dysfunction, moderate-severe MR, moderate TR.  Despite smoking history, PFTs in 2019 looked relatively normal. She additionally had the placement of a carotid stent on the right in 1/19.    RHC was done in 4/21, showing near-normal filling pressures with CI 2.12 by Fick.  CPX in 5/21 showed peak VO2 13, VE/VCO2 slope 37 => moderate HF limitation.  The PFTs on the CPX were not markedly abnormal. She saw Dr. Court recently, peripheral arterial dopplers in 4/21 showed significant stenosis right SFA.   We worked her up for LVAD.  She was presented at [facility] and saw Dr. Fleeta Ochoa.  He was concerned by her small stature and small LV cavity. Concern that Heartmate 3 would not function well in her small LV, and Heartware LVAD that was thought to be more suitable for small patients has been discontinued.   We considered her for a barostimulation trial, but she does not qualify for either ANTHEM or Batwire due to carotid disease.   She is off dapagliflozin  due to recurrrent GU yeast infections.  In 7/21, she had atherectomy of right SFA.  [employer] ICD placed in 10/21.   As she was turned down for LVAD at [facility], Dr. Rolan had her seen at [facility]. She underwent workup there with echo in 11/21 showing higher EF 35%. RHC, however, showed low CI 1.8.    Echo in 5/22 showed improvement in EF, now up to 45-50% with basal-mid inferolateral and anterolateral  akinesis, normal RV.   Underwent bronchoscopy 12/19 for pulmonary nodules, suspicious for malignancy.  Follow up 12/22, NYHA II-III symptoms, mildly volume overloaded and torsemide  increased to 60 bid.  Unfortunately diagnosed with small cell lung cancer 12/22 and has had treatment with chemo and radiation.   Echo in 11/23 with EF 40%, basal -mid inferior AK and inferolateral/anterolateral HK, RV normal, mild-moderate MR.   Patient had ICD shock in 4/23 due to VT, no prior chest pain or dyspnea.  Decision made to start amiodarone . She developed imbalance and had multiple falls after starting amiodarone , now off.   Echo 11/24 showed EF 40-45% with akinetic basal to mid inferior wall, normal RV, mild MR, IVC normal.   Today she returns for HF follow up. Still smoking.  Generally feels fatigued.  No chest pain.  Uses walker outside the house for balance.  No dyspnea walking into the office.  She sees Dr. Court for PV.  Has pain in her legs but not with exertion, generally notes at night when in bed.  No lightheadedness.  No palpitations.  No orthopnea/PND.  Weight has trended up.   ECG (personally reviewed): NSR, old anterior MI, old inferior MI  [employer] device interrogation (personally reviewed): Heartlogic score 0              Labs (8/24): LDL 53, TGs 147 Labs (10/24): K 2.9, creatinine 1.45, transferrin saturation 10% with ferritin 10  Labs (11/24): normal TSH Labs (2/25): K 3.3, creatinine 0.97, normal LFTs, LDL 42 Labs (4/25): hgb 14.9, creatinine 0.84, K 3.6  PMH: 1. CAD: CABG 2007.  - LHC in 2007 showed all grafts closed except LIMA-LAD.  Patient had DES to LCx/OM.   - LHC 2016 showed occlusion of DES to LCx/OM with L-L and L-R collaterals.  - Not candidate for redo CABG with poor targets.  2. Chronic systolic CHF: Ischemic cardiomyopathy.   - Echo (2/21): EF 20-25%, moderately dilated and moderately dysfunctional RV, PASP 49, severe biatrial enlargement, moderate-severe  MR, severe TR, mild-moderate AS, IVC dilated.  - RHC (4/21): mean RA 6, PA 41/16, mean 29, mean PCWP 15, CI 2.12, PVR 4.3 WU - CPX (5/21): peak VO2 13, VE/VCO2 slope 37, RER 1.06 => moderate HF limitation.  - Echo (11/21): EF 35%, mild LVH.  - RHC (11/21): baseline CI 1.8, exercise peak VO2 12.2, PCWP up to 24 with exercise.  - Echo (5/22): EF 45-50%, basal-mid inferolateral and anterolateral akinesis, normal RV.  - Echo (11/23): EF 40%, basal -mid inferior AK and inferolateral/anterolateral HK, RV normal, mild-moderate MR.  - Echo (11/24): EF 40-45% with akinetic basal to mid inferior wall, normal RV, mild MR, IVC normal.  3. Fe deficiency anemia: FOBT negative.  4. Carotid stenosis: Right carotid stent in 1/19.  - Carotid dopplers (7/20): 60-79% LICA stenosis.  - Carotid dopplers (6/21): 60-79% LICA stenosis.  - Carotid dopplers (6/22): 60-79% LICA stenosis.  - Carotid dopplers (10/23): 60-79% LICA stenosis, patent RICA stent.  - Carotid dopplers (10/24): 60-79% LICA stenosis 5. H/o CVA 6. HTN 7. Type 2 diabetes 8. Hyperlipidemia 9. Seizure disorder: Focal seizures 10. OSA: Cannot tolerate CPAP.  11. Active smoker: PFTs in 2019 were relatively normal.  She does use oxygen  at night.  - PFTs (5/21): FEV1 82%, FVC 80%, ratio 102% => minimal obstruction.  12. PAD: ABIs (4/21) 0.76 on right, 0.86 on left => significant right SFA stenosis.  - Atherectomy/angioplasty right SFA.  - Peripheral arterial dopplers (2/22): 50-74% stenosis right SFA.  - Peripheral arterial dopplers (6/24): 30-49% right CFA and SFA, occluded left SFA.  13. Chronic penetrating atherosclerotic ulcer descending thoracic aorta.  14. Small cell lung cancer: Diagnosed 12/22. Treated with carboplatin  and etoposide , and radiation therapy. 15. Esophageal stricture with dilation 16. VT: 4/23 with ICD discharge.  - Did not tolerate amiodarone  due to imbalance  Social History   Socioeconomic History   Marital status:  Divorced    Spouse name: Not on file   Number of children: 0   Years of education: Not on file   Highest education level: Not on file  Occupational History   Occupation: disabled  Tobacco Use   Smoking status: Every Day    Current packs/day: 0.00    Average packs/day: 0.5 packs/day for 54.4 years (27.2 ttl pk-yrs)    Types: Cigarettes    Start date: 68    Last attempt to quit: 02/22/2020    Years since quitting: 4.3   Smokeless tobacco: Never   Tobacco comments:    Pt smokes a 1 pack a day 08/30/2021 / pl  Vaping Use   Vaping status: Former  Substance and Sexual Activity   Alcohol use: No   Drug use: No   Sexual activity: Not Currently    Partners: Male    Birth control/protection: None  Other Topics Concern   Not on file  Social History Narrative   Right handed   Lives alone  in a one story home   Social Drivers of Health   Financial Resource Strain: Low Risk  (05/07/2024)   Overall Financial Resource Strain (CARDIA)    Difficulty of Paying Living Expenses: Not very hard  Food Insecurity: No Food Insecurity (05/07/2024)   Hunger Vital Sign    Worried About Running Out of Food in the Last Year: Never true    Ran Out of Food in the Last Year: Never true  Transportation Needs: No Transportation Needs (06/05/2024)   PRAPARE - Administrator, Civil Service (Medical): No    Lack of Transportation (Non-Medical): No  Physical Activity: Patient Declined (05/07/2024)   Exercise Vital Sign    Days of Exercise per Week: Patient declined    Minutes of Exercise per Session: Patient declined  Stress: No Stress Concern Present (05/07/2024)   [employer] of Occupational Health - Occupational Stress Questionnaire    Feeling of Stress: Not at all  Social Connections: Socially Isolated (05/07/2024)   Social Connection and Isolation Panel    Frequency of Communication with Friends and Family: More than three times a week    Frequency of Social Gatherings with Friends and  Family: Twice a week    Attends Religious Services: Never    Database administrator or Organizations: No    Attends Banker Meetings: Never    Marital Status: Divorced  Catering manager Violence: Not At Risk (05/07/2024)   Humiliation, Afraid, Rape, and Kick questionnaire    Fear of Current or Ex-Partner: No    Emotionally Abused: No    Physically Abused: No    Sexually Abused: No   Family History  Problem Relation Age of Onset   Heart disease Mother    Diabetes Mother    COPD Mother    Hyperlipidemia Mother    Hypertension Mother    Cancer Father        met, origin unknown   Heart attack Father    Hypertension Sister    Cancer Sister        eyelid   Glaucoma Sister    Heart disease Maternal Grandfather    Drug abuse Paternal Grandmother    Stroke Paternal Grandfather    Heart disease Maternal Aunt        x 2 aunts   Lung cancer Paternal Aunt        lung with mets to brain   Melanoma Paternal Uncle    Lung cancer Paternal Uncle        lung/liver to brain   Cancer Paternal Uncle        cancer of unknown type   Colon cancer Neg Hx    Stomach cancer Neg Hx    Esophageal cancer Neg Hx    ROS: All systems reviewed and negative except as per HPI.   Current Outpatient Medications  Medication Sig Dispense Refill   acetaminophen  (TYLENOL ) 500 MG tablet Take 1,000 mg by mouth at bedtime.     aspirin  EC 81 MG tablet Take 81 mg by mouth every evening.  30 tablet 6   BD PEN NEEDLE NANO 2ND GEN 32G X 4 MM MISC Inject into the skin 3 (three) times daily.     bisoprolol  (ZEBETA ) 5 MG tablet Take 0.5 tablets (2.5 mg total) by mouth daily. 15 tablet 11   clopidogrel  (PLAVIX ) 75 MG tablet TAKE 1 TABLET DAILY 90 tablet 3   digoxin  (LANOXIN ) 0.125 MG tablet TAKE ONE-HALF (1/2) TABLET DAILY 45 tablet  3   ezetimibe  (ZETIA ) 10 MG tablet Take 1 tablet (10 mg total) by mouth daily. 90 tablet 3   fluticasone  (FLONASE ) 50 MCG/ACT nasal spray Place 2 sprays into both nostrils  daily. 48 g 3   gabapentin  (NEURONTIN ) 300 MG capsule TAKE 2 CAPSULES DAILY AT   BEDTIME 180 capsule 3   glucose blood (ONETOUCH ULTRA) test strip 3 (three) times daily.     guaiFENesin  (MUCINEX ) 600 MG 12 hr tablet Take 2 tablets (1,200 mg total) by mouth 2 (two) times daily. 360 tablet 3   icosapent  Ethyl (VASCEPA ) 1 g capsule Take 1 g by mouth 2 (two) times daily.     insulin  NPH-regular Human (70-30) 100 UNIT/ML injection Inject 55 Units into the skin 2 (two) times daily with a meal.     levETIRAcetam  (KEPPRA ) 1000 MG tablet Take 1 tablet (1,000 mg total) by mouth 2 (two) times daily. 180 tablet 3   Magnesium  400 MG TABS Take 400 mg by mouth 2 (two) times daily. 180 tablet 3   metFORMIN  (GLUCOPHAGE ) 500 MG tablet Take 500 mg by mouth 2 (two) times daily with a meal.     metolazone  (ZAROXOLYN ) 2.5 MG tablet TAKE 1 TABLET TWICE WEEKLY,EVERY MONDAY AND FRIDAY    (CHANGE IN DOSAGE).     mexiletine (MEXITIL) 150 MG capsule Take 1 capsule (150 mg total) by mouth 2 (two) times daily. 180 capsule 3   nystatin  (MYCOSTATIN /NYSTOP ) powder APPLY 1 APPLICATION        TOPICALLY 4 TIMES A DAY AS NEEDED FOR YEAST INFECTIONS 60 g 5   ONE TOUCH ULTRA TEST test strip USE AS DIRECTED FOR TESTING BLOOD GLUCOSE 3 TIMES DAILY  1   pantoprazole  (PROTONIX ) 40 MG tablet Take 1 tablet (40 mg total) by mouth daily. 90 tablet 3   polyethylene glycol (MIRALAX  / GLYCOLAX ) 17 g packet Take 17 g by mouth daily.     potassium chloride  SA (KLOR-CON  M) 20 MEQ tablet Take 5 tablets (100 mEq total) by mouth daily. Take an extra 40 meq (2 tabs) twice a week with metolazone  498 tablet 3   ranolazine  (RANEXA ) 1000 MG SR tablet Take 1 tablet (1,000 mg total) by mouth 2 (two) times daily. 180 tablet 3   rosuvastatin  (CRESTOR ) 20 MG tablet Take 1 tablet (20 mg total) by mouth daily. 90 tablet 3   sodium chloride  (OCEAN) 0.65 % SOLN nasal spray Place 1 spray into both nostrils as needed for congestion.     spironolactone  (ALDACTONE ) 25  MG tablet TAKE 1 TABLET AT BEDTIME 90 tablet 3   torsemide  (DEMADEX ) 20 MG tablet Take 4 tablets (80 mg total) by mouth every morning AND 3 tablets (60 mg total) every evening. 540 tablet 3   triamcinolone  cream (KENALOG ) 0.5 % Apply 1 application topically 3 (three) times daily as needed (rash). To affected areas     Vericiguat  (VERQUVO ) 10 MG TABS Take 1 tablet (10 mg total) by mouth daily. 90 tablet 3   albuterol  (VENTOLIN  HFA) 108 (90 Base) MCG/ACT inhaler INHALE 2 PUFFS EVERY 6 HOURS AS NEEDED FOR WHEEZING/SHORTNESS OF BREATH (Patient not taking: Reported on 06/10/2024) 18 each 0   nitroGLYCERIN  (NITROLINGUAL ) 0.4 MG/SPRAY spray Place 1 spray under the tongue every 5 (five) minutes x 3 doses as needed for chest pain. 12 g 3   No current facility-administered medications for this encounter.   Wt Readings from Last 3 Encounters:  06/10/24 52.6 kg (116 lb)  05/07/24 51.3 kg (113  lb)  03/10/24 51.7 kg (114 lb)   BP 130/70   Pulse 93   Wt 52.6 kg (116 lb)   LMP 09/02/2003   SpO2 99%   BMI 25.10 kg/m  General: NAD Neck: No JVD, no thyromegaly or thyroid  nodule.  Lungs: Clear to auscultation bilaterally with normal respiratory effort. CV: Nondisplaced PMI.  Heart regular S1/S2, no S3/S4, 1/6 SEM RUSB.  No peripheral edema.  No carotid bruit.  Unable to palpate pedal pulses.  Abdomen: Soft, nontender, no hepatosplenomegaly, no distention.  Skin: Intact without lesions or rashes.  Neurologic: Alert and oriented x 3.  Psych: Normal affect. Extremities: No clubbing or cyanosis.  HEENT: Normal.   Assessment/Plan:  1. Chronic systolic CHF: Ischemic cardiomyopathy, [employer] ICD.  Echo in 2/21 showed EF 20-25%, moderately dilated and moderately dysfunctional RV, PASP 49, severe biatrial enlargement, moderate-severe MR, severe TR, mild-moderate AS, IVC dilated. CPX (5/21) with moderate HF limitation, RHC (4/21) with CI low at 2.12.  Medication titration has been severely limited by  hypotension and orthostatic symptoms.  Not a transplant candidate due to smoking. She was seen by cardiac surgery at [facility] and not thought to be good LVAD candidate due to small stature, small LV cavity, and redo sternotomy.  I sent her for a 2nd opinion at [facility].  She had echo there in 11/21 with EF higher at 35% but CI low on RHC at 1.8, no LVAD planned based on higher EF.  Echo in 5/22 showed EF 45-50%.  Echo in 11/23 with EF 40%, normal RV.  Echo in 11/24 with EF 40-45% with akinetic basal to mid inferior wall, normal RV, mild MR, IVC normal.  NYHA II-III symptoms chronically due to prominent fatigue.  She is not volume overloaded by exam or [employer]. - Continue torsemide  80 qam/60 qpm + 100 KCL daily.  BMET/BNP today.  - Continue vericiguat  10 mg daily.  - Continue metolazone  2.5 mg every Tuesday & Saturday. Add extra 40 KCL on metolazone  days. - Continue spironolactone  25 mg daily.  - Continue digoxin  0.0625 mg daily. Check dig level today. - She has not tolerated BP- active meds well, avoid Entresto  for now.  She became hypotensive with losartan .  - She did not tolerate SGLT2 inhibitor due to recurrent GU yeast infections.  - I would like her to start bisoprolol  2.5 mg daily.  - Echo in 11/25.  2. CAD: s/p CABG 2007 complicated by early graft closure.  Repeat cath in 2007 showed only LIMA-LAD still open and she had DES to LCX/OM.  Cath in 2016 showed this stent was occluded.  She has not had targets for redo CABG and she does not have good interventional targets. No recent chest pain. - Continue ASA 81, Plavix  75 mg daily.  - Continue ranolazone 1000 bid.   - Continue Crestor  20 mg daily + Zetia  10 mg daily, and Vascepa . Good lipids 2/25.  3. Smoking: Still smoking <1 ppd.  She is trying to quit.  - Does not want Chantix.  4. PAD: s/p atherectomy/angioplasty right SFA in 7/21. Has leg pain and weakness chronically. Peripheral arterial dopplers in 6/24 with occluded left SFA.  No rest  pain or pedal ulcers.  - Follows with Dr. Court for PV. 5. Carotid stenosis: Stable moderate LICA stenosis with patent right carotid stent.  - Needs carotid dopplers 10/25.      6. Fe deficiency anemia: Has had IV Fe in the past. This may contribute to fatigue.   -  She has received iron  transfusions. 7. Small cell lung cancer: Finished chemo and radiation. Most recent CT chest showed no evidence for recurrent metastatic disease. - She is followed by Dr. Sherrod. 8. VT: ICD discharge in 4/24.  No chest pain or CHF exacerbation.  Suspect scar-mediated.  She was started on amiodarone  but had to stop due to severe imbalance and falls, now resolved.  - Device interrogation as above - Continue mexiletine 150 mg bid.  Follow up in 3 months with APP.   I spent 31 minutes reviewing records, interviewing/examining patient, and managing orders.   Ezra Shuck  06/11/2024

## 2024-06-11 NOTE — Telephone Encounter (Signed)
 Per pharmacy NTG not covered, alternative requested Pt cost $224  Pt currently taking Renexa 1000 BID    Will route to provider and pharmacy team for alternative

## 2024-06-12 ENCOUNTER — Encounter: Payer: Self-pay | Admitting: Physician Assistant

## 2024-06-12 ENCOUNTER — Other Ambulatory Visit (HOSPITAL_COMMUNITY): Payer: Self-pay

## 2024-06-12 ENCOUNTER — Telehealth (HOSPITAL_COMMUNITY): Payer: Self-pay

## 2024-06-12 NOTE — Telephone Encounter (Signed)
 Advanced [facility] Failure Patient Advocate Encounter  Pharmacy contacted office requesting alternate / prior auth for Nitrolingual . Review of patients current coverage ([employer] Medicare D) shows that Nitrostat  tabs are covered $0 for 25 tablets. Nitrolingual  spray is not covered by this plan without documented failure of tablets.   Spoke to patient by phone, offered to send tabs for $0 copay, or patient can use discount pricing for spray (good rx shows apx $77 per package).  Patient expressed understanding and does not want tablets sent in at this time, as she feels they are not as effective and the packaging is very difficult for her to manage. Patient will contact [facility] to apply a discount to the spray.  Rachel DEL, CPhT Rx Patient Advocate Phone: [phone]

## 2024-06-25 ENCOUNTER — Other Ambulatory Visit (HOSPITAL_COMMUNITY): Payer: Self-pay

## 2024-06-25 MED ORDER — VERQUVO 10 MG PO TABS: 1.0000 | ORAL_TABLET | Freq: Every day | ORAL | 3 refills | Status: AC

## 2024-07-03 NOTE — Progress Notes (Signed)
 Remote ICD Transmission

## 2024-07-06 ENCOUNTER — Inpatient Hospital Stay: Attending: Internal Medicine

## 2024-07-06 ENCOUNTER — Inpatient Hospital Stay

## 2024-07-06 DIAGNOSIS — Z85118 Personal history of other malignant neoplasm of bronchus and lung: Secondary | ICD-10-CM | POA: Insufficient documentation

## 2024-07-06 DIAGNOSIS — D649 Anemia, unspecified: Secondary | ICD-10-CM | POA: Diagnosis not present

## 2024-07-06 DIAGNOSIS — Z452 Encounter for adjustment and management of vascular access device: Secondary | ICD-10-CM | POA: Diagnosis not present

## 2024-07-08 DIAGNOSIS — Z23 Encounter for immunization: Secondary | ICD-10-CM | POA: Diagnosis not present

## 2024-07-14 ENCOUNTER — Other Ambulatory Visit (HOSPITAL_COMMUNITY): Payer: Self-pay | Admitting: Cardiology

## 2024-07-21 ENCOUNTER — Ambulatory Visit: Payer: Medicare Other | Attending: Cardiology

## 2024-07-21 DIAGNOSIS — I255 Ischemic cardiomyopathy: Secondary | ICD-10-CM

## 2024-07-23 LAB — CUP PACEART REMOTE DEVICE CHECK
Battery Remaining Longevity: 132 mo
Battery Remaining Percentage: 93 %
Brady Statistic RV Percent Paced: 0 %
Date Time Interrogation Session: 20251021041100
HighPow Impedance: 83 Ohm
Implantable Lead Connection Status: 753985
Implantable Lead Implant Date: 20211015
Implantable Lead Location: 753860
Implantable Lead Model: 672
Implantable Lead Serial Number: 166771
Implantable Pulse Generator Implant Date: 20211015
Lead Channel Impedance Value: 985 Ohm
Lead Channel Setting Pacing Amplitude: 3.5 V
Lead Channel Setting Pacing Pulse Width: 0.4 ms
Lead Channel Setting Sensing Sensitivity: 0.5 mV
Pulse Gen Serial Number: 279742
Zone Setting Status: 755011

## 2024-07-24 NOTE — Progress Notes (Signed)
 Remote ICD Transmission

## 2024-07-27 ENCOUNTER — Ambulatory Visit (HOSPITAL_COMMUNITY)
Admission: RE | Admit: 2024-07-27 | Discharge: 2024-07-27 | Disposition: A | Discharge status: Home / Self Care | Source: Ambulatory Visit | Attending: Internal Medicine | Admitting: Internal Medicine

## 2024-07-27 ENCOUNTER — Inpatient Hospital Stay

## 2024-07-27 ENCOUNTER — Encounter (HOSPITAL_COMMUNITY): Payer: Self-pay

## 2024-07-27 ENCOUNTER — Ambulatory Visit: Payer: Self-pay | Admitting: Cardiology

## 2024-07-27 DIAGNOSIS — I5022 Chronic systolic (congestive) heart failure: Secondary | ICD-10-CM

## 2024-07-27 DIAGNOSIS — Z85118 Personal history of other malignant neoplasm of bronchus and lung: Secondary | ICD-10-CM | POA: Diagnosis not present

## 2024-07-27 DIAGNOSIS — C3491 Malignant neoplasm of unspecified part of right bronchus or lung: Secondary | ICD-10-CM | POA: Diagnosis not present

## 2024-07-27 DIAGNOSIS — C3490 Malignant neoplasm of unspecified part of unspecified bronchus or lung: Secondary | ICD-10-CM | POA: Insufficient documentation

## 2024-07-27 DIAGNOSIS — Z452 Encounter for adjustment and management of vascular access device: Secondary | ICD-10-CM | POA: Diagnosis not present

## 2024-07-27 DIAGNOSIS — E041 Nontoxic single thyroid nodule: Secondary | ICD-10-CM | POA: Diagnosis not present

## 2024-07-27 DIAGNOSIS — I700 Atherosclerosis of aorta: Secondary | ICD-10-CM | POA: Diagnosis not present

## 2024-07-27 DIAGNOSIS — R918 Other nonspecific abnormal finding of lung field: Secondary | ICD-10-CM | POA: Diagnosis not present

## 2024-07-27 DIAGNOSIS — D649 Anemia, unspecified: Secondary | ICD-10-CM | POA: Diagnosis not present

## 2024-07-27 LAB — CBC WITH DIFFERENTIAL (CANCER CENTER ONLY)
Abs Immature Granulocytes: 0.02 K/uL (ref 0.00–0.07)
Basophils Absolute: 0.1 K/uL (ref 0.0–0.1)
Basophils Relative: 1 %
Eosinophils Absolute: 0.1 K/uL (ref 0.0–0.5)
Eosinophils Relative: 2 %
HCT: 43.9 % (ref 36.0–46.0)
Hemoglobin: 15 g/dL (ref 12.0–15.0)
Immature Granulocytes: 0 %
Lymphocytes Relative: 16 %
Lymphs Abs: 0.9 K/uL (ref 0.7–4.0)
MCH: 29.5 pg (ref 26.0–34.0)
MCHC: 34.2 g/dL (ref 30.0–36.0)
MCV: 86.4 fL (ref 80.0–100.0)
Monocytes Absolute: 0.6 K/uL (ref 0.1–1.0)
Monocytes Relative: 11 %
Neutro Abs: 3.9 K/uL (ref 1.7–7.7)
Neutrophils Relative %: 70 %
Platelet Count: 190 K/uL (ref 150–400)
RBC: 5.08 MIL/uL (ref 3.87–5.11)
RDW: 14.3 % (ref 11.5–15.5)
WBC Count: 5.5 K/uL (ref 4.0–10.5)
nRBC: 0 % (ref 0.0–0.2)

## 2024-07-27 LAB — CMP (CANCER CENTER ONLY)
ALT: 11 U/L (ref 0–44)
AST: 14 U/L — ABNORMAL LOW (ref 15–41)
Albumin: 4.1 g/dL (ref 3.5–5.0)
Alkaline Phosphatase: 60 U/L (ref 38–126)
Anion gap: 6 (ref 5–15)
BUN: 18 mg/dL (ref 8–23)
CO2: 32 mmol/L (ref 22–32)
Calcium: 9.8 mg/dL (ref 8.9–10.3)
Chloride: 96 mmol/L — ABNORMAL LOW (ref 98–111)
Creatinine: 1.03 mg/dL — ABNORMAL HIGH (ref 0.44–1.00)
GFR, Estimated: 58 mL/min — ABNORMAL LOW (ref >60)
Glucose, Bld: 174 mg/dL — ABNORMAL HIGH (ref 70–99)
Potassium: 4.4 mmol/L (ref 3.5–5.1)
Sodium: 134 mmol/L — ABNORMAL LOW (ref 135–145)
Total Bilirubin: 0.4 mg/dL (ref 0.0–1.2)
Total Protein: 7.1 g/dL (ref 6.5–8.1)

## 2024-07-27 LAB — IRON AND IRON BINDING CAPACITY (CC-WL,HP ONLY)
Iron: 90 ug/dL (ref 28–170)
Saturation Ratios: 16 % (ref 10.4–31.8)
TIBC: 577 ug/dL — ABNORMAL HIGH (ref 250–450)
UIBC: 487 ug/dL — ABNORMAL HIGH (ref 148–442)

## 2024-07-27 LAB — FERRITIN: Ferritin: 27 ng/mL (ref 11–307)

## 2024-07-27 MED ORDER — HEPARIN SOD (PORK) LOCK FLUSH 100 UNIT/ML IV SOLN
500.0000 [IU] | Freq: Once | INTRAVENOUS | Status: AC
  Administered 2024-07-27: 500 [IU] via INTRAVENOUS

## 2024-07-27 MED ORDER — IOHEXOL 300 MG/ML  SOLN
75.0000 mL | Freq: Once | INTRAMUSCULAR | Status: AC | PRN
  Administered 2024-07-27: 75 mL via INTRAVENOUS

## 2024-07-27 MED ORDER — HEPARIN SOD (PORK) LOCK FLUSH 100 UNIT/ML IV SOLN
INTRAVENOUS | Status: AC
  Filled 2024-07-27: qty 5

## 2024-07-27 MED ORDER — SODIUM CHLORIDE (PF) 0.9 % IJ SOLN
INTRAMUSCULAR | Status: AC
  Filled 2024-07-27: qty 50

## 2024-07-28 ENCOUNTER — Other Ambulatory Visit: Payer: Self-pay | Admitting: Endocrinology

## 2024-07-28 DIAGNOSIS — M810 Age-related osteoporosis without current pathological fracture: Secondary | ICD-10-CM | POA: Diagnosis not present

## 2024-07-28 DIAGNOSIS — E1121 Type 2 diabetes mellitus with diabetic nephropathy: Secondary | ICD-10-CM | POA: Diagnosis not present

## 2024-07-28 DIAGNOSIS — E119 Type 2 diabetes mellitus without complications: Secondary | ICD-10-CM | POA: Diagnosis not present

## 2024-07-28 DIAGNOSIS — E7800 Pure hypercholesterolemia, unspecified: Secondary | ICD-10-CM | POA: Diagnosis not present

## 2024-07-28 DIAGNOSIS — I10 Essential (primary) hypertension: Secondary | ICD-10-CM | POA: Diagnosis not present

## 2024-07-28 DIAGNOSIS — E041 Nontoxic single thyroid nodule: Secondary | ICD-10-CM | POA: Diagnosis not present

## 2024-07-28 DIAGNOSIS — I509 Heart failure, unspecified: Secondary | ICD-10-CM | POA: Diagnosis not present

## 2024-07-28 DIAGNOSIS — E559 Vitamin D deficiency, unspecified: Secondary | ICD-10-CM | POA: Diagnosis not present

## 2024-07-28 DIAGNOSIS — G629 Polyneuropathy, unspecified: Secondary | ICD-10-CM | POA: Diagnosis not present

## 2024-07-28 DIAGNOSIS — E1165 Type 2 diabetes mellitus with hyperglycemia: Secondary | ICD-10-CM | POA: Diagnosis not present

## 2024-08-03 ENCOUNTER — Inpatient Hospital Stay: Attending: Internal Medicine | Admitting: Internal Medicine

## 2024-08-03 ENCOUNTER — Inpatient Hospital Stay

## 2024-08-03 VITALS — BP 112/72 | HR 70 | Temp 97.3°F | Resp 17 | Ht <= 58 in | Wt 113.0 lb

## 2024-08-03 DIAGNOSIS — D509 Iron deficiency anemia, unspecified: Secondary | ICD-10-CM | POA: Diagnosis not present

## 2024-08-03 DIAGNOSIS — C3490 Malignant neoplasm of unspecified part of unspecified bronchus or lung: Secondary | ICD-10-CM

## 2024-08-03 DIAGNOSIS — Z85118 Personal history of other malignant neoplasm of bronchus and lung: Secondary | ICD-10-CM | POA: Insufficient documentation

## 2024-08-03 DIAGNOSIS — Z720 Tobacco use: Secondary | ICD-10-CM | POA: Insufficient documentation

## 2024-08-03 DIAGNOSIS — M810 Age-related osteoporosis without current pathological fracture: Secondary | ICD-10-CM | POA: Diagnosis not present

## 2024-08-03 NOTE — Progress Notes (Signed)
 [facility] Telephone:([phone]   [phone]  OFFICE PROGRESS NOTE  Tower, Laine LABOR, MD [address] [place] [address]  DIAGNOSIS:  1) Limited stage (T3, N2, M0) small cell lung cancer presented with right hilar and mediastinal lymphadenopathy. There was no hypermetabolism in the left adrenal gland lesion. Diagnosed in December 2022. 2)  Iron  Deficiency Anemia   PRIOR THERAPY:  1) Systemic chemotherapy with systemic chemotherapy with carboplatin  for an AUC of 5, etoposide  100 mg per metered squared, and Imfinzi  1500 mg IV every 3 weeks with Cosela  for myeloprotection.  First dose on 10/16/2021.  Discontinued after 1 cycle as the patient staging PET scan showed limited stage disease. 2) systemic chemotherapy with carboplatin  for an AUC of 5 on day 1 and etoposide  100 mg/m2 on days 1, 2, and 3 IV every 3 weeks.  First dose on 11/06/2021.  This will be concurrent with radiotherapy under the care of Dr. Shannon.  Status post 3 cycles. 3) Venofer  infusions as needed. Last dose on 02/07/21    CURRENT THERAPY: Observation  INTERVAL HISTORY: Diane [name] 68 y.o. female returns to the clinic today for follow-up visit.  Discussed the use of AI scribe software for clinical note transcription with the patient, who gave verbal consent to proceed.  History of Present Illness Diane [name] is a 68 year old female with limited stage small cell lung cancer who presents for evaluation with repeat CT scan of the chest for staging of her disease.  She was diagnosed with limited stage small cell lung cancer in December 2022 and underwent systemic chemotherapy with carboplatin  and etoposide , concurrent with radiotherapy. Since completing treatment, she has been on observation. She is here today for a repeat CT scan of the chest to evaluate the current status of her disease.  She frequently feels tired, weak, and in pain. She also often feels cold, although her lab  results indicate good iron  and ferritin levels.  She has been diagnosed with severe osteoporosis following a bone density scan conducted by her endocrinologist.  Her blood sugar levels are often elevated, with a recent reading of 174 mg/dL, although her J8r was 3.4%. She mentions that her blood sugar 'goes up every once in a while.'    MEDICAL HISTORY: Past Medical History:  Diagnosis Date   Anemia    Arthritis    Carotid stenosis    a. s/p right carotid stent 10/2017.   Chronic systolic CHF (congestive heart failure) (HCC)    Colon polyps    COPD (chronic obstructive pulmonary disease) (HCC)    Coronary artery disease    post CABG in 3/07 , coronary stents    Depression    after major stroke years ago, no longer treated   Diabetes mellitus    type 2   Dyslipidemia    Fatty liver    Fibromyalgia    GERD (gastroesophageal reflux disease)    Headache    hx of    History of kidney stones    History of radiation therapy    right lung 11/15/2021-12/26/2021  Dr Lynwood Shannon   Hyperlipidemia    Hypertension    Lung cancer [facility])    Myocardial infarction (HCC)    Pneumonia    hx of    Seizures (HCC)    last seizure- 03/2013    Shortness of breath dyspnea    with exertion or when fluid builds up  Sleep apnea    used to wear a cpap- not used in 3 years    Status post dilation of esophageal narrowing    Stroke [facility]) 1993   problems with balance    Systolic murmur    known mild AS and MR   Thyroid  goiter    Tobacco abuse     ALLERGIES:  is allergic to bee venom, benadryl  [diphenhydramine ], entresto  [sacubitril -valsartan ], farxiga  [dapagliflozin ], tetracycline, canagliflozin, sitagliptin, and ace inhibitors.  MEDICATIONS:  Current Outpatient Medications  Medication Sig Dispense Refill   acetaminophen  (TYLENOL ) 500 MG tablet Take 1,000 mg by mouth at bedtime.     albuterol  (VENTOLIN  HFA) 108 (90 Base) MCG/ACT inhaler INHALE 2 PUFFS EVERY 6 HOURS AS NEEDED FOR  WHEEZING/SHORTNESS OF BREATH (Patient not taking: Reported on 06/10/2024) 18 each 0   aspirin  EC 81 MG tablet Take 81 mg by mouth every evening.  30 tablet 6   BD PEN NEEDLE NANO 2ND GEN 32G X 4 MM MISC Inject into the skin 3 (three) times daily.     bisoprolol  (ZEBETA ) 5 MG tablet Take 0.5 tablets (2.5 mg total) by mouth daily. 15 tablet 11   clopidogrel  (PLAVIX ) 75 MG tablet TAKE 1 TABLET DAILY 90 tablet 3   digoxin  (LANOXIN ) 0.125 MG tablet TAKE ONE-HALF (1/2) TABLET DAILY 45 tablet 3   ezetimibe  (ZETIA ) 10 MG tablet Take 1 tablet (10 mg total) by mouth daily. 90 tablet 3   fluticasone  (FLONASE ) 50 MCG/ACT nasal spray Place 2 sprays into both nostrils daily. 48 g 3   gabapentin  (NEURONTIN ) 300 MG capsule TAKE 2 CAPSULES DAILY AT   BEDTIME 180 capsule 3   glucose blood (ONETOUCH ULTRA) test strip 3 (three) times daily.     guaiFENesin  (MUCINEX ) 600 MG 12 hr tablet Take 2 tablets (1,200 mg total) by mouth 2 (two) times daily. 360 tablet 3   icosapent  Ethyl (VASCEPA ) 1 g capsule Take 1 g by mouth 2 (two) times daily.     insulin  NPH-regular Human (70-30) 100 UNIT/ML injection Inject 55 Units into the skin 2 (two) times daily with a meal.     levETIRAcetam  (KEPPRA ) 1000 MG tablet Take 1 tablet (1,000 mg total) by mouth 2 (two) times daily. 180 tablet 3   Magnesium  400 MG TABS Take 400 mg by mouth 2 (two) times daily. 180 tablet 3   metFORMIN  (GLUCOPHAGE ) 500 MG tablet Take 500 mg by mouth 2 (two) times daily with a meal.     metolazone  (ZAROXOLYN ) 2.5 MG tablet TAKE 1 TABLET TWICE WEEKLY,EVERY MONDAY AND FRIDAY    (CHANGE IN DOSAGE).     mexiletine (MEXITIL) 150 MG capsule Take 1 capsule (150 mg total) by mouth 2 (two) times daily. 180 capsule 3   nitroGLYCERIN  (NITROLINGUAL ) 0.4 MG/SPRAY spray PLACE 1 SPRAY UNDER THE TONGUE EVERY 5 (FIVE) MINUTES X 3 DOSES AS NEEDED FOR CHEST PAIN. 20 g 3   nystatin  (MYCOSTATIN /NYSTOP ) powder APPLY 1 APPLICATION        TOPICALLY 4 TIMES A DAY AS NEEDED FOR YEAST  INFECTIONS 60 g 5   ONE TOUCH ULTRA TEST test strip USE AS DIRECTED FOR TESTING BLOOD GLUCOSE 3 TIMES DAILY  1   pantoprazole  (PROTONIX ) 40 MG tablet Take 1 tablet (40 mg total) by mouth daily. 90 tablet 3   polyethylene glycol (MIRALAX  / GLYCOLAX ) 17 g packet Take 17 g by mouth daily.     potassium chloride  SA (KLOR-CON  M) 20 MEQ tablet Take 5 tablets (100 mEq total)  by mouth daily. Take an extra 40 meq (2 tabs) twice a week with metolazone  498 tablet 3   ranolazine  (RANEXA ) 1000 MG SR tablet Take 1 tablet (1,000 mg total) by mouth 2 (two) times daily. 180 tablet 3   rosuvastatin  (CRESTOR ) 20 MG tablet TAKE 1 TABLET DAILY 90 tablet 3   sodium chloride  (OCEAN) 0.65 % SOLN nasal spray Place 1 spray into both nostrils as needed for congestion.     spironolactone  (ALDACTONE ) 25 MG tablet TAKE 1 TABLET AT BEDTIME 90 tablet 3   torsemide  (DEMADEX ) 20 MG tablet Take 4 tablets (80 mg total) by mouth every morning AND 3 tablets (60 mg total) every evening. 540 tablet 3   triamcinolone  cream (KENALOG ) 0.5 % Apply 1 application topically 3 (three) times daily as needed (rash). To affected areas     Vericiguat  (VERQUVO ) 10 MG TABS Take 1 tablet (10 mg total) by mouth daily. 90 tablet 3   No current facility-administered medications for this visit.    SURGICAL HISTORY:  Past Surgical History:  Procedure Laterality Date   ABDOMINAL AORTOGRAM W/LOWER EXTREMITY N/A 04/18/2020   Procedure: ABDOMINAL AORTOGRAM W/LOWER EXTREMITY;  Surgeon: Court Dorn PARAS, MD;  Location: [facility];  Service: Cardiovascular;  Laterality: N/A;   APPENDECTOMY     BIOPSY  02/03/2019   Procedure: BIOPSY;  Surgeon: Aneita Gwendlyn DASEN, MD;  Location: [facility] ENDOSCOPY;  Service: Endoscopy;;   BREAST BIOPSY Left 2016   BRONCHIAL BIOPSY  09/18/2021   Procedure: BRONCHIAL BIOPSIES;  Surgeon: Shelah Lamar RAMAN, MD;  Location: [facility] ENDOSCOPY;  Service: Pulmonary;;   BRONCHIAL BRUSHINGS  09/18/2021   Procedure: BRONCHIAL BRUSHINGS;   Surgeon: Shelah Lamar RAMAN, MD;  Location: [facility] ENDOSCOPY;  Service: Pulmonary;;   BRONCHIAL NEEDLE ASPIRATION BIOPSY  09/18/2021   Procedure: BRONCHIAL NEEDLE ASPIRATION BIOPSIES;  Surgeon: Shelah Lamar RAMAN, MD;  Location: [facility] ENDOSCOPY;  Service: Pulmonary;;   CARDIAC CATHETERIZATION  11/29/05   EF of 55%   CARDIAC CATHETERIZATION  08/06/06   EF of 45-50%   CARDIAC CATHETERIZATION N/A 05/06/2015   Procedure: Right/Left Heart Cath and Coronary/Graft Angiography;  Surgeon: Peter M Jordan, MD;  Location: [facility];  Service: Cardiovascular;  Laterality: N/A;   CAROTID PTA/STENT INTERVENTION N/A 10/10/2017   Procedure: CAROTID PTA/STENT INTERVENTION - Right;  Surgeon: Serene Gaile ORN, MD;  Location: [facility];  Service: Cardiovascular;  Laterality: N/A;   CERVICAL FUSION  1990   CHOLECYSTECTOMY     COLON RESECTION     mass removed and 4 in of colon   COLONOSCOPY WITH PROPOFOL  N/A 07/16/2017   Procedure: COLONOSCOPY WITH PROPOFOL ;  Surgeon: Aneita Gwendlyn DASEN, MD;  Location: [facility] ENDOSCOPY;  Service: Endoscopy;  Laterality: N/A;   CORONARY ARTERY BYPASS GRAFT  12/04/2005   x5 -- left internal mammary artery to the LAD, left radial artery to the ramus intermedius, saphenous vein graft to the obtuse marginal 1, sequential saphenous vein grat to the acute marginal and posterior descending, endoscopic vein harvesting from the left thigh with open vein harvest from right leg   CORONARY STENT PLACEMENT  08/11/06   PCI of her ciurcumflex/OM vessel   ESOPHAGOGASTRODUODENOSCOPY (EGD) WITH PROPOFOL  N/A 02/03/2019   Procedure: ESOPHAGOGASTRODUODENOSCOPY (EGD) WITH PROPOFOL ;  Surgeon: Aneita Gwendlyn DASEN, MD;  Location: [facility] ENDOSCOPY;  Service: Endoscopy;  Laterality: N/A;   FINE NEEDLE ASPIRATION  09/18/2021   Procedure: FINE NEEDLE ASPIRATION (FNA) LINEAR;  Surgeon: Shelah Lamar RAMAN, MD;  Location: [facility] ENDOSCOPY;  Service: Pulmonary;;   ICD IMPLANT N/A 07/15/2020   Procedure: ICD IMPLANT;  Surgeon: Cindie Ole DASEN, MD;  Location: [facility] INVASIVE CV LAB;  Service: Cardiovascular;  Laterality: N/A;   IR IMAGING GUIDED PORT INSERTION  10/20/2021   LAPAROTOMY Bilateral 05/19/2015   Procedure: EXPLORATORY LAPAROTOMY WITH BILATERAL SALPINGO OOPHORECTOMY /OMENTECTOMY/SEGMENTAL SIGMOID COLECTOMY ;  Surgeon: Maurilio Ship, MD;  Location: [facility] ORS;  Service: Gynecology;  Laterality: Bilateral;   PERIPHERAL VASCULAR BALLOON ANGIOPLASTY  04/18/2020   Procedure: PERIPHERAL VASCULAR BALLOON ANGIOPLASTY;  Surgeon: Court Dorn PARAS, MD;  Location: [facility];  Service: Cardiovascular;;  rt SFA  atherectomy and DCB   PERIPHERAL VASCULAR CATHETERIZATION N/A 11/15/2015   Procedure: Carotid PTA/Stent Intervention;  Surgeon: Dorn PARAS Court, MD;  Location: [facility];  Service: Cardiovascular;  Laterality: N/A;   PERIPHERAL VASCULAR CATHETERIZATION  11/15/2015   Procedure: Carotid Angiography;  Surgeon: Dorn PARAS Court, MD;  Location: [facility] INVASIVE CV LAB;  Service: Cardiovascular;;   RIGHT HEART CATH N/A 01/27/2020   Procedure: RIGHT HEART CATH;  Surgeon: Rolan Ezra RAMAN, MD;  Location: [facility] INVASIVE CV LAB;  Service: Cardiovascular;  Laterality: N/A;   VIDEO BRONCHOSCOPY WITH ENDOBRONCHIAL ULTRASOUND N/A 09/18/2021   Procedure: VIDEO BRONCHOSCOPY WITH ENDOBRONCHIAL ULTRASOUND;  Surgeon: Shelah Lamar RAMAN, MD;  Location: [facility]OS[facility];  Service: Pulmonary;  Laterality: N/A;   VIDEO BRONCHOSCOPY WITH RADIAL ENDOBRONCHIAL ULTRASOUND  09/18/2021   Procedure: RADIAL ENDOBRONCHIAL ULTRASOUND;  Surgeon: Shelah Lamar RAMAN, MD;  Location: [facility] ENDOS[facility];  Service: Pulmonary;;    REVIEW OF SYSTEMS:  A comprehensive review of systems was negative except for: Constitutional: positive for fatigue Respiratory: positive for dyspnea on exertion   PHYSICAL EXAMINATION: General appearance: alert, cooperative, appears stated age, fatigued, and no distress Head: Normocephalic, without obvious abnormality, atraumatic Neck: no adenopathy,  no JVD, supple, symmetrical, trachea midline, and thyroid  not enlarged, symmetric, no tenderness/mass/nodules Lymph nodes: Cervical, supraclavicular, and axillary nodes normal. Resp: clear to auscultation bilaterally Back: symmetric, no curvature. ROM normal. No CVA tenderness. Cardio: regular rate and rhythm, S1, S2 normal, no murmur, click, rub or gallop GI: soft, non-tender; bowel sounds normal; no masses,  no organomegaly Extremities: extremities normal, atraumatic, no cyanosis or edema  ECOG PERFORMANCE STATUS: 1 - Symptomatic but completely ambulatory  Blood pressure 112/72, pulse 70, temperature (!) 97.3 F (36.3 C), temperature source Temporal, resp. rate 17, height 4' 9 (1.448 m), weight 113 lb (51.3 kg), last menstrual period 09/02/2003, SpO2 100%.  LABORATORY DATA: Lab Results  Component Value Date   WBC 5.5 07/27/2024   HGB 15.0 07/27/2024   HCT 43.9 07/27/2024   MCV 86.4 07/27/2024   PLT 190 07/27/2024      Chemistry      Component Value Date/Time   NA 134 (L) 07/27/2024 1146   NA 135 03/26/2023 1310   NA 135 (L) 02/15/2017 1457   K 4.4 07/27/2024 1146   K 4.8 02/15/2017 1457   CL 96 (L) 07/27/2024 1146   CO2 32 07/27/2024 1146   CO2 26 02/15/2017 1457   BUN 18 07/27/2024 1146   BUN 28 (H) 03/26/2023 1310   BUN 10.8 02/15/2017 1457   CREATININE 1.03 (H) 07/27/2024 1146   CREATININE 0.69 05/06/2017 1405   CREATININE 0.7 02/15/2017 1457      Component Value Date/Time   CALCIUM  9.8 07/27/2024 1146   CALCIUM  9.3 02/15/2017 1457   ALKPHOS 60 07/27/2024 1146   ALKPHOS 75 02/15/2017 1457   AST 14 (L) 07/27/2024  1146   AST 9 02/15/2017 1457   ALT 11 07/27/2024 1146   ALT 13 02/15/2017 1457   BILITOT 0.4 07/27/2024 1146   BILITOT 0.28 02/15/2017 1457       RADIOGRAPHIC STUDIES: CT Chest W Contrast Result Date: 07/28/2024 CLINICAL DATA:  Small cell lung cancer presenting in the right hilar and mediastinal lymph nodes status post chemo XRT and  immunotherapy. Surveillance. * Tracking Code: BO * EXAM: CT CHEST WITH CONTRAST TECHNIQUE: Multidetector CT imaging of the chest was performed during intravenous contrast administration. RADIATION DOSE REDUCTION: This exam was performed according to the departmental dose-optimization program which includes automated exposure control, adjustment of the mA and/or kV according to patient size and/or use of iterative reconstruction technique. CONTRAST:  75mL OMNIPAQUE  IOHEXOL  300 MG/ML  SOLN COMPARISON:  CT chest dated 01/28/2024 FINDINGS: Cardiovascular: Left chest wall ICD lead terminates in the right ventricle. Right chest wall port tip terminates at the superior cavoatrial junction. Normal heart size. No significant pericardial fluid/thickening. Ascending thoracic aorta measures 3.9 x 3.8 cm on today's examination. No central pulmonary emboli. Coronary artery calcifications and aortic atherosclerosis. Focal penetrating atherosclerotic ulcer is again seen along the left aspect of the descending thoracic aorta measuring 1.6 x 0.8 cm (2:99). Mediastinum/Nodes: Right thyroid  nodule, previously evaluated. No specific follow-up imaging recommended. Normal esophagus. No pathologically enlarged axillary, supraclavicular, mediastinal, or hilar lymph nodes. Lungs/Pleura: The central airways are patent. Fibrotic changes of the right mid lung associated with architectural distortion in keeping with postradiation changes. No suspicious pulmonary nodules. Unchanged 3 mm left upper lobe (8:62) and right upper lobe (8:67) nodules. No pneumothorax. No pleural effusion. Upper abdomen: Cholecystectomy. Unchanged intra and extrahepatic bile duct dilation. Unchanged thickening of the bilateral adrenal glands. Musculoskeletal: No acute or abnormal lytic or blastic osseous lesions. Median sternotomy wires are nondisplaced. IMPRESSION: 1. Postradiation changes of the right mid lung. No evidence of recurrent or metastatic disease in the  chest. 2. Unchanged 3 mm left upper lobe and right upper lobe nodules. 3. Unchanged focal penetrating atherosclerotic ulcer along the left aspect of the descending thoracic aorta. 4. Aortic Atherosclerosis (ICD10-I70.0). Coronary artery calcifications. Assessment for potential risk factor modification, dietary therapy or pharmacologic therapy may be warranted, if clinically indicated. Electronically Signed   By: Limin  Xu M.D.   On: 07/28/2024 17:48   CUP PACEART REMOTE DEVICE CHECK Result Date: 07/23/2024 ICD: Scheduled remote reviewed. Normal device function.  Presenting rhythm: VS Next remote transmission per protocol. ML, CVRS   ASSESSMENT AND PLAN: This is a very pleasant 68 years old white female recently diagnosed with Limited stage (T3, N2, M0) small cell lung cancer presented with right hilar and mediastinal lymphadenopathy. There was no hypermetabolism in the left adrenal gland lesion. Diagnosed in December 2022. She initially started treatment with carboplatin , etoposide , Cosela  and Imfinzi  with the expectation that she had extensive stage disease status post 1 cycle. Staging work-up with PET scan showed no hypermetabolic activity and the suspicious left adrenal gland lesion. Her treatment was switched to chemotherapy with with carboplatin  for AUC of 5 on day 1, etoposide  100 Mg/M2 on days 1, 2 and 3 every 3 weeks concurrent with radiation.  First dose today 11/06/2021.  Status post 3 cycles. She tolerated her treatment well with no concerning adverse effect except for fatigue as well as radiation-induced esophagitis with dysphagia and odynophagia. The patient declined prophylactic cranial irradiation. She is currently on observation and feeling fine except for fatigue. She had repeat CT scan of  the chest performed recently.  I personally independently reviewed the scan and discussed the result with the patient today.  Her scan showed no concerning findings for disease recurrence or  metastasis. Assessment and Plan Assessment & Plan Limited stage small cell lung cancer Diagnosed in December 2022, treated with carboplatin  and etoposide  chemotherapy, concurrent with radiotherapy. Recent CT scan shows no evidence of disease progression, indicating disease control. - Order repeat CT scan of the chest in 6 months  Anemia Anemia is well-managed with satisfactory iron  and ferritin levels. Reports feeling cold and weak, which may not be directly related to anemia.  Severe osteoporosis Severe osteoporosis diagnosed by endocrinologist via bone density scan. Current scan shows no bone lesions, a positive finding.  Elevated blood sugar Blood sugar level is elevated at 174 mg/dL, but J8r is 3.4%, indicating generally acceptable glycemic control with occasional fluctuations.  Generalized weakness and fatigue Reports fatigue, weakness, and pain. Lab results and scans are satisfactory. Symptoms may be related to inactivity. Advised to increase physical activity gradually to improve energy levels. - Encourage gradual increase in physical activity - Advise finding a safe place to walk and increase activity daily  Follow-up Scheduled for follow-up in six months to reassess condition and continue monitoring health status. - Schedule follow-up appointment in 6 months - Order port flush - Provide copy of lab results and scan to her The patient was advised to call immediately if she has any concerning symptoms in the interval.  The patient voices understanding of current disease status and treatment options and is in agreement with the current care plan.  All questions were answered. The patient knows to call the clinic with any problems, questions or concerns. We can certainly see the patient much sooner if necessary. The total time spent in the appointment was 20 minutes.  Disclaimer: This note was dictated with voice recognition software. Similar sounding words can inadvertently be  transcribed and may not be corrected upon review.

## 2024-08-12 ENCOUNTER — Ambulatory Visit (HOSPITAL_COMMUNITY)
Admission: RE | Admit: 2024-08-12 | Discharge: 2024-08-12 | Disposition: A | Discharge status: Home / Self Care | Source: Ambulatory Visit | Attending: Cardiology | Admitting: Cardiology

## 2024-08-12 DIAGNOSIS — I5022 Chronic systolic (congestive) heart failure: Secondary | ICD-10-CM | POA: Insufficient documentation

## 2024-08-12 LAB — ECHOCARDIOGRAM COMPLETE
AR max vel: 1.04 cm2
AV Area VTI: 1.16 cm2
AV Area mean vel: 1.01 cm2
AV Mean grad: 8.6 mmHg
AV Peak grad: 15.8 mmHg
Ao pk vel: 1.99 m/s
Area-P 1/2: 6.48 cm2
Calc EF: 45.2 %
S' Lateral: 3.4 cm
Single Plane A2C EF: 46 %
Single Plane A4C EF: 40.4 %

## 2024-08-12 NOTE — Progress Notes (Signed)
  Echocardiogram 2D Echocardiogram has been performed.  Norleen ORN Khamya Topp 08/12/2024, 1:24 PM

## 2024-08-12 NOTE — Progress Notes (Signed)
 Carotid artery duplex has been completed. Preliminary results can be found in CV Proc through chart review.   08/12/24 1:59 PM Cathlyn Collet RVT

## 2024-08-18 NOTE — Telephone Encounter (Addendum)
 Pt aware, agreeable, and verbalized understanding   ----- Message from Ezra Shuck sent at 08/12/2024  2:49 PM EST ----- EF 45-50% (mild LV dysfunction) with mild AS.  Stable compared to prior.  ----- Message ----- From: Interface, Three One Seven Sent: 08/12/2024   1:55 PM EST To: Ezra GORMAN Shuck, MD

## 2024-09-07 ENCOUNTER — Inpatient Hospital Stay

## 2024-09-07 ENCOUNTER — Inpatient Hospital Stay: Attending: Internal Medicine

## 2024-09-07 NOTE — Progress Notes (Signed)
 PCP: Randeen Laine LABOR, MD Cardiology: Dr. Jordan HF Cardiology: Dr. Rolan  Chief complaint: CHF  68 y.o. with history of CAD, ischemic cardiomyopathy, CVA, and valvular disease was referred by Dr. Jordan for CHF evaluation.  Patient has an extensive history of vascular disease.  She had CABG in 2007.  Repeat LHC in 2007 showed all grafts closed except LIMA-LAD.  Patient had DES to LCx/OM.  LHC 2016 showed occlusion of DES to LCx/OM with L-L and L-R collaterals. Poor targets for redo CABG.   In 2/21, echo was done showing EF 20-25% with moderate RV dysfunction, moderate-severe MR, moderate TR.  Despite smoking history, PFTs in 2019 looked relatively normal. She additionally had the placement of a carotid stent on the right in 1/19.    RHC was done in 4/21, showing near-normal filling pressures with CI 2.12 by Fick.  CPX in 5/21 showed peak VO2 13, VE/VCO2 slope 37 => moderate HF limitation.  The PFTs on the CPX were not markedly abnormal. She saw Dr. Court recently, peripheral arterial dopplers in 4/21 showed significant stenosis right SFA.   We worked her up for LVAD.  She was presented at [facility] and saw Dr. Fleeta Ochoa.  He was concerned by her small stature and small LV cavity. Concern that Heartmate 3 would not function well in her small LV, and Heartware LVAD that was thought to be more suitable for small patients has been discontinued.   We considered her for a barostimulation trial, but she does not qualify for either ANTHEM or Batwire due to carotid disease.   She is off dapagliflozin  due to recurrrent GU yeast infections.  In 7/21, she had atherectomy of right SFA.  [employer] ICD placed in 10/21.   As she was turned down for LVAD at [facility], Dr. Rolan had her seen at [facility]. She underwent workup there with echo in 11/21 showing higher EF 35%. RHC, however, showed low CI 1.8.    Echo in 5/22 showed improvement in EF, now up to 45-50% with basal-mid inferolateral and anterolateral  akinesis, normal RV.   Underwent bronchoscopy 12/19 for pulmonary nodules, suspicious for malignancy.  Follow up 12/22, NYHA II-III symptoms, mildly volume overloaded and torsemide  increased to 60 bid.  Unfortunately diagnosed with small cell lung cancer 12/22 and has had treatment with chemo and radiation.   Echo in 11/23 with EF 40%, basal -mid inferior AK and inferolateral/anterolateral HK, RV normal, mild-moderate MR.   Patient had ICD shock in 4/23 due to VT, no prior chest pain or dyspnea.  Decision made to start amiodarone . She developed imbalance and had multiple falls after starting amiodarone , now off.   Echo 11/24 showed EF 40-45% with akinetic basal to mid inferior wall, normal RV, mild MR, IVC normal.   Today she returns for HF follow up. Still smoking.  Generally feels fatigued.  No chest pain.  Uses walker outside the house for balance.  No dyspnea walking into the office.  She sees Dr. Court for PV.  Has pain in her legs but not with exertion, generally notes at night when in bed.  No lightheadedness.  No palpitations.  No orthopnea/PND.  Weight has trended up.   ECG (personally reviewed): NSR, old anterior MI, old inferior MI  [employer] device interrogation (personally reviewed): Heartlogic score 0              Labs (8/24): LDL 53, TGs 147 Labs (10/24): K 2.9, creatinine 1.45, transferrin saturation 10% with ferritin 10  Labs (11/24): normal TSH Labs (2/25): K 3.3, creatinine 0.97, normal LFTs, LDL 42 Labs (4/25): hgb 14.9, creatinine 0.84, K 3.6  PMH: 1. CAD: CABG 2007.  - LHC in 2007 showed all grafts closed except LIMA-LAD.  Patient had DES to LCx/OM.   - LHC 2016 showed occlusion of DES to LCx/OM with L-L and L-R collaterals.  - Not candidate for redo CABG with poor targets.  2. Chronic systolic CHF: Ischemic cardiomyopathy.   - Echo (2/21): EF 20-25%, moderately dilated and moderately dysfunctional RV, PASP 49, severe biatrial enlargement, moderate-severe  MR, severe TR, mild-moderate AS, IVC dilated.  - RHC (4/21): mean RA 6, PA 41/16, mean 29, mean PCWP 15, CI 2.12, PVR 4.3 WU - CPX (5/21): peak VO2 13, VE/VCO2 slope 37, RER 1.06 => moderate HF limitation.  - Echo (11/21): EF 35%, mild LVH.  - RHC (11/21): baseline CI 1.8, exercise peak VO2 12.2, PCWP up to 24 with exercise.  - Echo (5/22): EF 45-50%, basal-mid inferolateral and anterolateral akinesis, normal RV.  - Echo (11/23): EF 40%, basal -mid inferior AK and inferolateral/anterolateral HK, RV normal, mild-moderate MR.  - Echo (11/24): EF 40-45% with akinetic basal to mid inferior wall, normal RV, mild MR, IVC normal.  3. Fe deficiency anemia: FOBT negative.  4. Carotid stenosis: Right carotid stent in 1/19.  - Carotid dopplers (7/20): 60-79% LICA stenosis.  - Carotid dopplers (6/21): 60-79% LICA stenosis.  - Carotid dopplers (6/22): 60-79% LICA stenosis.  - Carotid dopplers (10/23): 60-79% LICA stenosis, patent RICA stent.  - Carotid dopplers (10/24): 60-79% LICA stenosis 5. H/o CVA 6. HTN 7. Type 2 diabetes 8. Hyperlipidemia 9. Seizure disorder: Focal seizures 10. OSA: Cannot tolerate CPAP.  11. Active smoker: PFTs in 2019 were relatively normal.  She does use oxygen  at night.  - PFTs (5/21): FEV1 82%, FVC 80%, ratio 102% => minimal obstruction.  12. PAD: ABIs (4/21) 0.76 on right, 0.86 on left => significant right SFA stenosis.  - Atherectomy/angioplasty right SFA.  - Peripheral arterial dopplers (2/22): 50-74% stenosis right SFA.  - Peripheral arterial dopplers (6/24): 30-49% right CFA and SFA, occluded left SFA.  13. Chronic penetrating atherosclerotic ulcer descending thoracic aorta.  14. Small cell lung cancer: Diagnosed 12/22. Treated with carboplatin  and etoposide , and radiation therapy. 15. Esophageal stricture with dilation 16. VT: 4/23 with ICD discharge.  - Did not tolerate amiodarone  due to imbalance  Social History   Socioeconomic History   Marital status:  Divorced    Spouse name: Not on file   Number of children: 0   Years of education: Not on file   Highest education level: Not on file  Occupational History   Occupation: disabled  Tobacco Use   Smoking status: Every Day    Current packs/day: 0.00    Average packs/day: 0.5 packs/day for 54.4 years (27.2 ttl pk-yrs)    Types: Cigarettes    Start date: 89    Last attempt to quit: 02/22/2020    Years since quitting: 4.5   Smokeless tobacco: Never   Tobacco comments:    Pt smokes a 1 pack a day 08/30/2021 / pl  Vaping Use   Vaping status: Former  Substance and Sexual Activity   Alcohol use: No   Drug use: No   Sexual activity: Not Currently    Partners: Male    Birth control/protection: None  Other Topics Concern   Not on file  Social History Narrative   Right handed   Lives alone  in a one story home   Social Drivers of Health   Financial Resource Strain: Low Risk  (05/07/2024)   Overall Financial Resource Strain (CARDIA)    Difficulty of Paying Living Expenses: Not very hard  Food Insecurity: No Food Insecurity (05/07/2024)   Hunger Vital Sign    Worried About Running Out of Food in the Last Year: Never true    Ran Out of Food in the Last Year: Never true  Transportation Needs: No Transportation Needs (06/05/2024)   PRAPARE - Administrator, Civil Service (Medical): No    Lack of Transportation (Non-Medical): No  Physical Activity: Patient Declined (05/07/2024)   Exercise Vital Sign    Days of Exercise per Week: Patient declined    Minutes of Exercise per Session: Patient declined  Stress: No Stress Concern Present (05/07/2024)   [employer] of Occupational Health - Occupational Stress Questionnaire    Feeling of Stress: Not at all  Social Connections: Socially Isolated (05/07/2024)   Social Connection and Isolation Panel    Frequency of Communication with Friends and Family: More than three times a week    Frequency of Social Gatherings with Friends and  Family: Twice a week    Attends Religious Services: Never    Database Administrator or Organizations: No    Attends Banker Meetings: Never    Marital Status: Divorced  Catering Manager Violence: Not At Risk (05/07/2024)   Humiliation, Afraid, Rape, and Kick questionnaire    Fear of Current or Ex-Partner: No    Emotionally Abused: No    Physically Abused: No    Sexually Abused: No   Family History  Problem Relation Age of Onset   Heart disease Mother    Diabetes Mother    COPD Mother    Hyperlipidemia Mother    Hypertension Mother    Cancer Father        met, origin unknown   Heart attack Father    Hypertension Sister    Cancer Sister        eyelid   Glaucoma Sister    Heart disease Maternal Grandfather    Drug abuse Paternal Grandmother    Stroke Paternal Grandfather    Heart disease Maternal Aunt        x 2 aunts   Lung cancer Paternal Aunt        lung with mets to brain   Melanoma Paternal Uncle    Lung cancer Paternal Uncle        lung/liver to brain   Cancer Paternal Uncle        cancer of unknown type   Colon cancer Neg Hx    Stomach cancer Neg Hx    Esophageal cancer Neg Hx    ROS: All systems reviewed and negative except as per HPI.   Current Outpatient Medications  Medication Sig Dispense Refill   acetaminophen  (TYLENOL ) 500 MG tablet Take 1,000 mg by mouth at bedtime.     albuterol  (VENTOLIN  HFA) 108 (90 Base) MCG/ACT inhaler INHALE 2 PUFFS EVERY 6 HOURS AS NEEDED FOR WHEEZING/SHORTNESS OF BREATH (Patient not taking: Reported on 06/10/2024) 18 each 0   aspirin  EC 81 MG tablet Take 81 mg by mouth every evening.  30 tablet 6   BD PEN NEEDLE NANO 2ND GEN 32G X 4 MM MISC Inject into the skin 3 (three) times daily.     bisoprolol  (ZEBETA ) 5 MG tablet Take 0.5 tablets (2.5 mg total) by mouth daily.  15 tablet 11   clopidogrel  (PLAVIX ) 75 MG tablet TAKE 1 TABLET DAILY 90 tablet 3   digoxin  (LANOXIN ) 0.125 MG tablet TAKE ONE-HALF (1/2) TABLET DAILY 45  tablet 3   ezetimibe  (ZETIA ) 10 MG tablet Take 1 tablet (10 mg total) by mouth daily. 90 tablet 3   fluticasone  (FLONASE ) 50 MCG/ACT nasal spray Place 2 sprays into both nostrils daily. 48 g 3   gabapentin  (NEURONTIN ) 300 MG capsule TAKE 2 CAPSULES DAILY AT   BEDTIME 180 capsule 3   glucose blood (ONETOUCH ULTRA) test strip 3 (three) times daily.     guaiFENesin  (MUCINEX ) 600 MG 12 hr tablet Take 2 tablets (1,200 mg total) by mouth 2 (two) times daily. 360 tablet 3   icosapent  Ethyl (VASCEPA ) 1 g capsule Take 1 g by mouth 2 (two) times daily.     insulin  NPH-regular Human (70-30) 100 UNIT/ML injection Inject 55 Units into the skin 2 (two) times daily with a meal.     levETIRAcetam  (KEPPRA ) 1000 MG tablet Take 1 tablet (1,000 mg total) by mouth 2 (two) times daily. 180 tablet 3   Magnesium  400 MG TABS Take 400 mg by mouth 2 (two) times daily. 180 tablet 3   metFORMIN  (GLUCOPHAGE ) 500 MG tablet Take 500 mg by mouth 2 (two) times daily with a meal.     metolazone  (ZAROXOLYN ) 2.5 MG tablet TAKE 1 TABLET TWICE WEEKLY,EVERY MONDAY AND FRIDAY    (CHANGE IN DOSAGE).     mexiletine (MEXITIL) 150 MG capsule Take 1 capsule (150 mg total) by mouth 2 (two) times daily. 180 capsule 3   nitroGLYCERIN  (NITROLINGUAL ) 0.4 MG/SPRAY spray PLACE 1 SPRAY UNDER THE TONGUE EVERY 5 (FIVE) MINUTES X 3 DOSES AS NEEDED FOR CHEST PAIN. 20 g 3   nystatin  (MYCOSTATIN /NYSTOP ) powder APPLY 1 APPLICATION        TOPICALLY 4 TIMES A DAY AS NEEDED FOR YEAST INFECTIONS 60 g 5   ONE TOUCH ULTRA TEST test strip USE AS DIRECTED FOR TESTING BLOOD GLUCOSE 3 TIMES DAILY  1   pantoprazole  (PROTONIX ) 40 MG tablet Take 1 tablet (40 mg total) by mouth daily. 90 tablet 3   polyethylene glycol (MIRALAX  / GLYCOLAX ) 17 g packet Take 17 g by mouth daily.     potassium chloride  SA (KLOR-CON  M) 20 MEQ tablet Take 5 tablets (100 mEq total) by mouth daily. Take an extra 40 meq (2 tabs) twice a week with metolazone  498 tablet 3   ranolazine  (RANEXA ) 1000  MG SR tablet Take 1 tablet (1,000 mg total) by mouth 2 (two) times daily. 180 tablet 3   rosuvastatin  (CRESTOR ) 20 MG tablet TAKE 1 TABLET DAILY 90 tablet 3   sodium chloride  (OCEAN) 0.65 % SOLN nasal spray Place 1 spray into both nostrils as needed for congestion.     spironolactone  (ALDACTONE ) 25 MG tablet TAKE 1 TABLET AT BEDTIME 90 tablet 3   torsemide  (DEMADEX ) 20 MG tablet Take 4 tablets (80 mg total) by mouth every morning AND 3 tablets (60 mg total) every evening. 540 tablet 3   triamcinolone  cream (KENALOG ) 0.5 % Apply 1 application topically 3 (three) times daily as needed (rash). To affected areas     Vericiguat  (VERQUVO ) 10 MG TABS Take 1 tablet (10 mg total) by mouth daily. 90 tablet 3   No current facility-administered medications for this visit.   Wt Readings from Last 3 Encounters:  08/03/24 51.3 kg (113 lb)  06/10/24 52.6 kg (116 lb)  05/07/24 51.3 kg (  113 lb)   LMP 09/02/2003  General: NAD Neck: No JVD, no thyromegaly or thyroid  nodule.  Lungs: Clear to auscultation bilaterally with normal respiratory effort. CV: Nondisplaced PMI.  Heart regular S1/S2, no S3/S4, 1/6 SEM RUSB.  No peripheral edema.  No carotid bruit.  Unable to palpate pedal pulses.  Abdomen: Soft, nontender, no hepatosplenomegaly, no distention.  Skin: Intact without lesions or rashes.  Neurologic: Alert and oriented x 3.  Psych: Normal affect. Extremities: No clubbing or cyanosis.  HEENT: Normal.   Assessment/Plan:  1. Chronic systolic CHF: Ischemic cardiomyopathy, [employer] ICD.  Echo in 2/21 showed EF 20-25%, moderately dilated and moderately dysfunctional RV, PASP 49, severe biatrial enlargement, moderate-severe MR, severe TR, mild-moderate AS, IVC dilated. CPX (5/21) with moderate HF limitation, RHC (4/21) with CI low at 2.12.  Medication titration has been severely limited by hypotension and orthostatic symptoms.  Not a transplant candidate due to smoking. She was seen by cardiac surgery at  [facility] and not thought to be good LVAD candidate due to small stature, small LV cavity, and redo sternotomy.  I sent her for a 2nd opinion at [facility].  She had echo there in 11/21 with EF higher at 35% but CI low on RHC at 1.8, no LVAD planned based on higher EF.  Echo in 5/22 showed EF 45-50%.  Echo in 11/23 with EF 40%, normal RV.  Echo in 11/24 with EF 40-45% with akinetic basal to mid inferior wall, normal RV, mild MR, IVC normal.  NYHA II-III symptoms chronically due to prominent fatigue.  She is not volume overloaded by exam or [employer]. - Continue torsemide  80 qam/60 qpm + 100 KCL daily.  BMET/BNP today.  - Continue vericiguat  10 mg daily.  - Continue metolazone  2.5 mg every Tuesday & Saturday. Add extra 40 KCL on metolazone  days. - Continue spironolactone  25 mg daily.  - Continue digoxin  0.0625 mg daily. Check dig level today. - She has not tolerated BP- active meds well, avoid Entresto  for now.  She became hypotensive with losartan .  - She did not tolerate SGLT2 inhibitor due to recurrent GU yeast infections.  - I would like her to start bisoprolol  2.5 mg daily.  - Echo in 11/25.  2. CAD: s/p CABG 2007 complicated by early graft closure.  Repeat cath in 2007 showed only LIMA-LAD still open and she had DES to LCX/OM.  Cath in 2016 showed this stent was occluded.  She has not had targets for redo CABG and she does not have good interventional targets. No recent chest pain. - Continue ASA 81, Plavix  75 mg daily.  - Continue ranolazone 1000 bid.   - Continue Crestor  20 mg daily + Zetia  10 mg daily, and Vascepa . Good lipids 2/25.  3. Smoking: Still smoking <1 ppd.  She is trying to quit.  - Does not want Chantix.  4. PAD: s/p atherectomy/angioplasty right SFA in 7/21. Has leg pain and weakness chronically. Peripheral arterial dopplers in 6/24 with occluded left SFA.  No rest pain or pedal ulcers.  - Follows with Dr. Court for PV. 5. Carotid stenosis: Stable moderate LICA stenosis with patent  right carotid stent.  - Needs carotid dopplers 10/25.      6. Fe deficiency anemia: Has had IV Fe in the past. This may contribute to fatigue.   - She has received iron  transfusions. 7. Small cell lung cancer: Finished chemo and radiation. Most recent CT chest showed no evidence for recurrent metastatic disease. - She is  followed by Dr. Sherrod. 8. VT: ICD discharge in 4/24.  No chest pain or CHF exacerbation.  Suspect scar-mediated.  She was started on amiodarone  but had to stop due to severe imbalance and falls, now resolved.  - Device interrogation as above - Continue mexiletine 150 mg bid.  Follow up in 3 months with APP.   I spent 31 minutes reviewing records, interviewing/examining patient, and managing orders.   Harlene HERO [facility]  09/07/2024

## 2024-09-08 ENCOUNTER — Telehealth (HOSPITAL_COMMUNITY): Payer: Self-pay

## 2024-09-08 NOTE — Telephone Encounter (Signed)
 Called to confirm/remind patient of their appointment at the [facility] on 09/09/24 .   Appointment:   [] Confirmed  [x] Left mess   [] No answer/No voice mail  [] VM Full/unable to leave message  [] Phone not in service  And to bring in all medications and/or complete list.

## 2024-09-09 ENCOUNTER — Ambulatory Visit (HOSPITAL_COMMUNITY): Payer: Self-pay | Admitting: Family Medicine

## 2024-09-09 ENCOUNTER — Ambulatory Visit (HOSPITAL_COMMUNITY)
Admission: RE | Admit: 2024-09-09 | Discharge: 2024-09-09 | Disposition: A | Discharge status: Home / Self Care | Source: Ambulatory Visit | Attending: Family Medicine | Admitting: Family Medicine

## 2024-09-09 ENCOUNTER — Encounter (HOSPITAL_COMMUNITY): Payer: Self-pay

## 2024-09-09 VITALS — BP 132/74 | HR 92 | Ht <= 58 in | Wt 112.0 lb

## 2024-09-09 DIAGNOSIS — R5381 Other malaise: Secondary | ICD-10-CM

## 2024-09-09 DIAGNOSIS — I110 Hypertensive heart disease with heart failure: Secondary | ICD-10-CM | POA: Diagnosis not present

## 2024-09-09 DIAGNOSIS — I5022 Chronic systolic (congestive) heart failure: Secondary | ICD-10-CM | POA: Diagnosis present

## 2024-09-09 DIAGNOSIS — Z951 Presence of aortocoronary bypass graft: Secondary | ICD-10-CM | POA: Diagnosis not present

## 2024-09-09 DIAGNOSIS — Z95810 Presence of automatic (implantable) cardiac defibrillator: Secondary | ICD-10-CM | POA: Insufficient documentation

## 2024-09-09 DIAGNOSIS — I739 Peripheral vascular disease, unspecified: Secondary | ICD-10-CM | POA: Diagnosis not present

## 2024-09-09 DIAGNOSIS — I6523 Occlusion and stenosis of bilateral carotid arteries: Secondary | ICD-10-CM | POA: Diagnosis not present

## 2024-09-09 DIAGNOSIS — Z720 Tobacco use: Secondary | ICD-10-CM

## 2024-09-09 DIAGNOSIS — F17210 Nicotine dependence, cigarettes, uncomplicated: Secondary | ICD-10-CM | POA: Insufficient documentation

## 2024-09-09 DIAGNOSIS — I255 Ischemic cardiomyopathy: Secondary | ICD-10-CM | POA: Insufficient documentation

## 2024-09-09 DIAGNOSIS — I472 Ventricular tachycardia, unspecified: Secondary | ICD-10-CM | POA: Diagnosis not present

## 2024-09-09 DIAGNOSIS — Z8673 Personal history of transient ischemic attack (TIA), and cerebral infarction without residual deficits: Secondary | ICD-10-CM | POA: Diagnosis not present

## 2024-09-09 DIAGNOSIS — Z7902 Long term (current) use of antithrombotics/antiplatelets: Secondary | ICD-10-CM | POA: Diagnosis not present

## 2024-09-09 DIAGNOSIS — Z7982 Long term (current) use of aspirin: Secondary | ICD-10-CM | POA: Insufficient documentation

## 2024-09-09 DIAGNOSIS — Z9221 Personal history of antineoplastic chemotherapy: Secondary | ICD-10-CM | POA: Diagnosis not present

## 2024-09-09 DIAGNOSIS — C3490 Malignant neoplasm of unspecified part of unspecified bronchus or lung: Secondary | ICD-10-CM

## 2024-09-09 DIAGNOSIS — I2510 Atherosclerotic heart disease of native coronary artery without angina pectoris: Secondary | ICD-10-CM | POA: Diagnosis not present

## 2024-09-09 DIAGNOSIS — D509 Iron deficiency anemia, unspecified: Secondary | ICD-10-CM | POA: Diagnosis not present

## 2024-09-09 DIAGNOSIS — I25708 Atherosclerosis of coronary artery bypass graft(s), unspecified, with other forms of angina pectoris: Secondary | ICD-10-CM | POA: Diagnosis not present

## 2024-09-09 DIAGNOSIS — Z801 Family history of malignant neoplasm of trachea, bronchus and lung: Secondary | ICD-10-CM | POA: Diagnosis not present

## 2024-09-09 DIAGNOSIS — I6529 Occlusion and stenosis of unspecified carotid artery: Secondary | ICD-10-CM | POA: Insufficient documentation

## 2024-09-09 DIAGNOSIS — Z85118 Personal history of other malignant neoplasm of bronchus and lung: Secondary | ICD-10-CM | POA: Diagnosis not present

## 2024-09-09 DIAGNOSIS — Z79899 Other long term (current) drug therapy: Secondary | ICD-10-CM | POA: Insufficient documentation

## 2024-09-09 DIAGNOSIS — Z823 Family history of stroke: Secondary | ICD-10-CM | POA: Insufficient documentation

## 2024-09-09 DIAGNOSIS — Z955 Presence of coronary angioplasty implant and graft: Secondary | ICD-10-CM | POA: Insufficient documentation

## 2024-09-09 DIAGNOSIS — M810 Age-related osteoporosis without current pathological fracture: Secondary | ICD-10-CM | POA: Insufficient documentation

## 2024-09-09 DIAGNOSIS — Z8249 Family history of ischemic heart disease and other diseases of the circulatory system: Secondary | ICD-10-CM | POA: Diagnosis not present

## 2024-09-09 DIAGNOSIS — Z923 Personal history of irradiation: Secondary | ICD-10-CM | POA: Diagnosis not present

## 2024-09-09 LAB — DIGOXIN LEVEL: Digoxin Level: <0.6 ng/mL — ABNORMAL LOW (ref 0.8–2.0)

## 2024-09-09 LAB — BASIC METABOLIC PANEL WITH GFR
Anion gap: 13 (ref 5–15)
BUN: 28 mg/dL — ABNORMAL HIGH (ref 8–23)
CO2: 25 mmol/L (ref 22–32)
Calcium: 9.9 mg/dL (ref 8.9–10.3)
Chloride: 95 mmol/L — ABNORMAL LOW (ref 98–111)
Creatinine, Ser: 1.2 mg/dL — ABNORMAL HIGH (ref 0.44–1.00)
GFR, Estimated: 49 mL/min — ABNORMAL LOW (ref >60)
Glucose, Bld: 248 mg/dL — ABNORMAL HIGH (ref 70–99)
Potassium: 3.9 mmol/L (ref 3.5–5.1)
Sodium: 133 mmol/L — ABNORMAL LOW (ref 135–145)

## 2024-09-09 LAB — BRAIN NATRIURETIC PEPTIDE: B Natriuretic Peptide: 104.2 pg/mL — ABNORMAL HIGH (ref 0.0–100.0)

## 2024-09-09 MED ORDER — BISOPROLOL FUMARATE 5 MG PO TABS: 5.0000 mg | ORAL_TABLET | Freq: Every day | ORAL | 3 refills | Status: AC

## 2024-09-09 NOTE — Patient Instructions (Addendum)
 Good to see you today!   INCREASE bisoprolol  to 5 mg (1 tablet) daily  You have been referred to  home health they will reach out to you  Labs done today, your results will be available in MyChart, we will contact you for abnormal readings.  Your physician recommends that you schedule a follow-up appointment 6 months(June) Call office in April to schedule an appointment  If you have any questions or concerns before your next appointment please send us  a message through [facility] or call our office at [phone].    TO LEAVE A MESSAGE FOR THE NURSE SELECT OPTION 2, PLEASE LEAVE A MESSAGE INCLUDING: YOUR NAME DATE OF BIRTH CALL BACK NUMBER REASON FOR CALL**this is important as we prioritize the call backs  YOU WILL RECEIVE A CALL BACK THE SAME DAY AS LONG AS YOU CALL BEFORE 4:00 PM At the [facility], you and your health needs are our priority. As part of our continuing mission to provide you with exceptional heart care, we have created designated Provider Care Teams. These Care Teams include your primary Cardiologist (physician) and Advanced Practice Providers (APPs- Physician Assistants and Nurse Practitioners) who all work together to provide you with the care you need, when you need it.   You may see any of the following providers on your designated Care Team at your next follow up: Dr Toribio Fuel Dr Ezra Shuck Dr. Morene Brownie Greig Mosses, NP Caffie Shed, [place] [facility] [place], [place] Beckey Coe, NP Jordan Lee, NP Ellouise Class, NP Tinnie Redman, PharmD Jaun Bash, PharmD   Please be sure to bring in all your medications bottles to every appointment.    Thank you for choosing [facility]

## 2024-10-05 ENCOUNTER — Inpatient Hospital Stay: Attending: Internal Medicine

## 2024-10-12 ENCOUNTER — Other Ambulatory Visit (HOSPITAL_COMMUNITY): Payer: Self-pay | Admitting: Cardiology

## 2024-10-20 ENCOUNTER — Ambulatory Visit: Payer: Medicare Other

## 2024-10-20 DIAGNOSIS — I255 Ischemic cardiomyopathy: Secondary | ICD-10-CM | POA: Diagnosis not present

## 2024-10-21 LAB — CUP PACEART REMOTE DEVICE CHECK
Battery Remaining Longevity: 132 mo
Battery Remaining Percentage: 91 %
Brady Statistic RV Percent Paced: 0 %
Date Time Interrogation Session: 20260120041100
HighPow Impedance: 89 Ohm
Implantable Lead Connection Status: 753985
Implantable Lead Implant Date: 20211015
Implantable Lead Location: 753860
Implantable Lead Model: 672
Implantable Lead Serial Number: 166771
Implantable Pulse Generator Implant Date: 20211015
Lead Channel Impedance Value: 1063 Ohm
Lead Channel Setting Pacing Amplitude: 3.5 V
Lead Channel Setting Pacing Pulse Width: 0.4 ms
Lead Channel Setting Sensing Sensitivity: 0.5 mV
Pulse Gen Serial Number: 279742
Zone Setting Status: 755011

## 2024-10-23 NOTE — Progress Notes (Signed)
 Remote ICD Transmission

## 2024-10-25 ENCOUNTER — Ambulatory Visit: Payer: Self-pay | Admitting: Cardiology

## 2024-11-02 ENCOUNTER — Inpatient Hospital Stay

## 2024-11-05 ENCOUNTER — Inpatient Hospital Stay

## 2024-11-05 ENCOUNTER — Inpatient Hospital Stay: Attending: Internal Medicine

## 2024-11-11 ENCOUNTER — Inpatient Hospital Stay: Attending: Internal Medicine

## 2024-11-11 ENCOUNTER — Inpatient Hospital Stay

## 2024-11-30 ENCOUNTER — Inpatient Hospital Stay: Attending: Internal Medicine

## 2025-01-04 ENCOUNTER — Inpatient Hospital Stay: Attending: Internal Medicine

## 2025-01-19 ENCOUNTER — Ambulatory Visit: Payer: Medicare Other

## 2025-01-25 ENCOUNTER — Inpatient Hospital Stay

## 2025-02-01 ENCOUNTER — Inpatient Hospital Stay: Attending: Internal Medicine | Admitting: Internal Medicine

## 2025-03-10 ENCOUNTER — Ambulatory Visit: Admitting: Neurology

## 2025-05-10 ENCOUNTER — Ambulatory Visit

## 2025-05-11 ENCOUNTER — Ambulatory Visit
